# Patient Record
Sex: Female | Born: 1950 | Race: Black or African American | Hispanic: No | Marital: Single | Smoking: Former smoker
Health system: Southern US, Community
[De-identification: ages and names within clinical notes are randomized; demographics above are authoritative.]

## PROBLEM LIST (undated history)

## (undated) DIAGNOSIS — C9000 Multiple myeloma not having achieved remission: Secondary | ICD-10-CM

## (undated) DIAGNOSIS — F419 Anxiety disorder, unspecified: Secondary | ICD-10-CM

## (undated) DIAGNOSIS — J189 Pneumonia, unspecified organism: Secondary | ICD-10-CM

## (undated) DIAGNOSIS — I10 Essential (primary) hypertension: Secondary | ICD-10-CM

## (undated) DIAGNOSIS — E876 Hypokalemia: Secondary | ICD-10-CM

## (undated) DIAGNOSIS — J449 Chronic obstructive pulmonary disease, unspecified: Secondary | ICD-10-CM

## (undated) DIAGNOSIS — E0590 Thyrotoxicosis, unspecified without thyrotoxic crisis or storm: Secondary | ICD-10-CM

## (undated) DIAGNOSIS — I509 Heart failure, unspecified: Secondary | ICD-10-CM

## (undated) DIAGNOSIS — R768 Other specified abnormal immunological findings in serum: Secondary | ICD-10-CM

## (undated) DIAGNOSIS — Z9481 Bone marrow transplant status: Secondary | ICD-10-CM

## (undated) DIAGNOSIS — K219 Gastro-esophageal reflux disease without esophagitis: Secondary | ICD-10-CM

## (undated) DIAGNOSIS — I5030 Unspecified diastolic (congestive) heart failure: Secondary | ICD-10-CM

## (undated) DIAGNOSIS — Z9189 Other specified personal risk factors, not elsewhere classified: Secondary | ICD-10-CM

## (undated) HISTORY — DX: Unspecified diastolic (congestive) heart failure: I50.30

## (undated) HISTORY — DX: Anxiety disorder, unspecified: F41.9

## (undated) HISTORY — DX: Hypokalemia: E87.6

## (undated) HISTORY — DX: Other specified abnormal immunological findings in serum: R76.8

## (undated) HISTORY — DX: Multiple myeloma not having achieved remission: C90.00

## (undated) HISTORY — PX: THYROIDECTOMY: SHX17

## (undated) HISTORY — DX: Other specified personal risk factors, not elsewhere classified: Z91.89

## (undated) HISTORY — DX: Bone marrow transplant status: Z94.81

## (undated) MED FILL — Dexamethasone Sodium Phosphate Inj 100 MG/10ML: INTRAMUSCULAR | Qty: 2 | Status: AC

---

## 2008-03-27 ENCOUNTER — Ambulatory Visit: Payer: Self-pay

## 2009-11-08 ENCOUNTER — Emergency Department: Payer: Self-pay | Admitting: Emergency Medicine

## 2009-12-17 ENCOUNTER — Emergency Department: Payer: Self-pay | Admitting: Emergency Medicine

## 2012-03-02 ENCOUNTER — Emergency Department: Payer: Self-pay | Admitting: Emergency Medicine

## 2017-04-05 ENCOUNTER — Emergency Department: Payer: Medicare Other

## 2017-04-05 ENCOUNTER — Encounter: Payer: Self-pay | Admitting: *Deleted

## 2017-04-05 ENCOUNTER — Inpatient Hospital Stay
Admission: EM | Admit: 2017-04-05 | Discharge: 2017-04-11 | DRG: 684 | Disposition: A | Discharge status: Home / Self Care | Payer: Medicare Other | Attending: Internal Medicine | Admitting: Internal Medicine

## 2017-04-05 DIAGNOSIS — R946 Abnormal results of thyroid function studies: Secondary | ICD-10-CM | POA: Diagnosis present

## 2017-04-05 DIAGNOSIS — M545 Low back pain: Secondary | ICD-10-CM | POA: Diagnosis present

## 2017-04-05 DIAGNOSIS — D892 Hypergammaglobulinemia, unspecified: Secondary | ICD-10-CM | POA: Diagnosis present

## 2017-04-05 DIAGNOSIS — I10 Essential (primary) hypertension: Secondary | ICD-10-CM

## 2017-04-05 DIAGNOSIS — Z825 Family history of asthma and other chronic lower respiratory diseases: Secondary | ICD-10-CM

## 2017-04-05 DIAGNOSIS — F17210 Nicotine dependence, cigarettes, uncomplicated: Secondary | ICD-10-CM | POA: Diagnosis not present

## 2017-04-05 DIAGNOSIS — D638 Anemia in other chronic diseases classified elsewhere: Secondary | ICD-10-CM | POA: Diagnosis present

## 2017-04-05 DIAGNOSIS — D4989 Neoplasm of unspecified behavior of other specified sites: Secondary | ICD-10-CM | POA: Diagnosis not present

## 2017-04-05 DIAGNOSIS — N179 Acute kidney failure, unspecified: Principal | ICD-10-CM | POA: Diagnosis present

## 2017-04-05 DIAGNOSIS — K5900 Constipation, unspecified: Secondary | ICD-10-CM | POA: Diagnosis present

## 2017-04-05 DIAGNOSIS — E0590 Thyrotoxicosis, unspecified without thyrotoxic crisis or storm: Secondary | ICD-10-CM | POA: Diagnosis present

## 2017-04-05 DIAGNOSIS — R339 Retention of urine, unspecified: Secondary | ICD-10-CM | POA: Diagnosis not present

## 2017-04-05 DIAGNOSIS — R05 Cough: Secondary | ICD-10-CM | POA: Diagnosis not present

## 2017-04-05 DIAGNOSIS — C9000 Multiple myeloma not having achieved remission: Secondary | ICD-10-CM | POA: Diagnosis not present

## 2017-04-05 DIAGNOSIS — N390 Urinary tract infection, site not specified: Secondary | ICD-10-CM | POA: Diagnosis not present

## 2017-04-05 DIAGNOSIS — R1900 Intra-abdominal and pelvic swelling, mass and lump, unspecified site: Secondary | ICD-10-CM | POA: Diagnosis not present

## 2017-04-05 DIAGNOSIS — E538 Deficiency of other specified B group vitamins: Secondary | ICD-10-CM | POA: Diagnosis present

## 2017-04-05 DIAGNOSIS — R0609 Other forms of dyspnea: Secondary | ICD-10-CM | POA: Diagnosis not present

## 2017-04-05 DIAGNOSIS — R319 Hematuria, unspecified: Secondary | ICD-10-CM | POA: Diagnosis not present

## 2017-04-05 DIAGNOSIS — R1084 Generalized abdominal pain: Secondary | ICD-10-CM | POA: Diagnosis not present

## 2017-04-05 DIAGNOSIS — R630 Anorexia: Secondary | ICD-10-CM | POA: Diagnosis not present

## 2017-04-05 DIAGNOSIS — D472 Monoclonal gammopathy: Secondary | ICD-10-CM | POA: Diagnosis not present

## 2017-04-05 DIAGNOSIS — D649 Anemia, unspecified: Secondary | ICD-10-CM

## 2017-04-05 DIAGNOSIS — R809 Proteinuria, unspecified: Secondary | ICD-10-CM | POA: Diagnosis not present

## 2017-04-05 DIAGNOSIS — C9002 Multiple myeloma in relapse: Secondary | ICD-10-CM

## 2017-04-05 DIAGNOSIS — Z716 Tobacco abuse counseling: Secondary | ICD-10-CM | POA: Diagnosis not present

## 2017-04-05 DIAGNOSIS — Z79899 Other long term (current) drug therapy: Secondary | ICD-10-CM | POA: Diagnosis not present

## 2017-04-05 DIAGNOSIS — R634 Abnormal weight loss: Secondary | ICD-10-CM | POA: Diagnosis not present

## 2017-04-05 DIAGNOSIS — M549 Dorsalgia, unspecified: Secondary | ICD-10-CM | POA: Diagnosis not present

## 2017-04-05 DIAGNOSIS — G8929 Other chronic pain: Secondary | ICD-10-CM | POA: Diagnosis present

## 2017-04-05 DIAGNOSIS — G479 Sleep disorder, unspecified: Secondary | ICD-10-CM | POA: Diagnosis present

## 2017-04-05 DIAGNOSIS — Z820 Family history of epilepsy and other diseases of the nervous system: Secondary | ICD-10-CM | POA: Diagnosis not present

## 2017-04-05 DIAGNOSIS — R200 Anesthesia of skin: Secondary | ICD-10-CM | POA: Diagnosis not present

## 2017-04-05 DIAGNOSIS — Z720 Tobacco use: Secondary | ICD-10-CM | POA: Diagnosis not present

## 2017-04-05 LAB — URINALYSIS, COMPLETE (UACMP) WITH MICROSCOPIC
BILIRUBIN URINE: NEGATIVE
Bacteria, UA: NONE SEEN
GLUCOSE, UA: NEGATIVE mg/dL
Ketones, ur: NEGATIVE mg/dL
NITRITE: NEGATIVE
PROTEIN: 100 mg/dL — AB
Specific Gravity, Urine: 1.006 (ref 1.005–1.030)
pH: 5 (ref 5.0–8.0)

## 2017-04-05 LAB — CBC
HCT: 27.4 % — ABNORMAL LOW (ref 35.0–47.0)
Hemoglobin: 9.3 g/dL — ABNORMAL LOW (ref 12.0–16.0)
MCH: 31.7 pg (ref 26.0–34.0)
MCHC: 34.1 g/dL (ref 32.0–36.0)
MCV: 92.9 fL (ref 80.0–100.0)
PLATELETS: 224 10*3/uL (ref 150–440)
RBC: 2.94 MIL/uL — AB (ref 3.80–5.20)
RDW: 15.1 % — ABNORMAL HIGH (ref 11.5–14.5)
WBC: 7.8 10*3/uL (ref 3.6–11.0)

## 2017-04-05 LAB — COMPREHENSIVE METABOLIC PANEL
ALK PHOS: 48 U/L (ref 38–126)
ALT: 9 U/L — AB (ref 14–54)
ANION GAP: 15 (ref 5–15)
AST: 22 U/L (ref 15–41)
Albumin: 3.7 g/dL (ref 3.5–5.0)
BILIRUBIN TOTAL: 0.6 mg/dL (ref 0.3–1.2)
BUN: 55 mg/dL — ABNORMAL HIGH (ref 6–20)
CALCIUM: 9.5 mg/dL (ref 8.9–10.3)
CO2: 21 mmol/L — ABNORMAL LOW (ref 22–32)
CREATININE: 7.24 mg/dL — AB (ref 0.44–1.00)
Chloride: 102 mmol/L (ref 101–111)
GFR, EST AFRICAN AMERICAN: 6 mL/min — AB (ref >60)
GFR, EST NON AFRICAN AMERICAN: 5 mL/min — AB (ref >60)
Glucose, Bld: 107 mg/dL — ABNORMAL HIGH (ref 65–99)
Potassium: 4 mmol/L (ref 3.5–5.1)
SODIUM: 138 mmol/L (ref 135–145)
TOTAL PROTEIN: 10.1 g/dL — AB (ref 6.5–8.1)

## 2017-04-05 LAB — IRON AND TIBC
Iron: 35 ug/dL (ref 28–170)
SATURATION RATIOS: 15 % (ref 10.4–31.8)
TIBC: 230 ug/dL — ABNORMAL LOW (ref 250–450)
UIBC: 195 ug/dL

## 2017-04-05 LAB — RETICULOCYTES
RBC.: 2.66 MIL/uL — AB (ref 3.80–5.20)
RETIC COUNT ABSOLUTE: 16 10*3/uL — AB (ref 19.0–183.0)
RETIC CT PCT: 0.6 % (ref 0.4–3.1)

## 2017-04-05 LAB — LIPASE, BLOOD: Lipase: 15 U/L (ref 11–51)

## 2017-04-05 LAB — FOLATE: Folate: 8.1 ng/mL (ref >5.9)

## 2017-04-05 LAB — FERRITIN: FERRITIN: 340 ng/mL — AB (ref 11–307)

## 2017-04-05 MED ORDER — ONDANSETRON HCL 4 MG PO TABS
4.0000 mg | ORAL_TABLET | Freq: Four times a day (QID) | ORAL | Status: DC | PRN
  Administered 2017-04-07: 4 mg via ORAL
  Filled 2017-04-05: qty 1

## 2017-04-05 MED ORDER — HEPARIN SODIUM (PORCINE) 5000 UNIT/ML IJ SOLN
5000.0000 [IU] | Freq: Three times a day (TID) | INTRAMUSCULAR | Status: DC
  Administered 2017-04-05 – 2017-04-07 (×5): 5000 [IU] via SUBCUTANEOUS
  Filled 2017-04-05 (×5): qty 1

## 2017-04-05 MED ORDER — DOCUSATE SODIUM 100 MG PO CAPS
100.0000 mg | ORAL_CAPSULE | Freq: Two times a day (BID) | ORAL | Status: DC
  Administered 2017-04-05 – 2017-04-11 (×12): 100 mg via ORAL
  Filled 2017-04-05 (×12): qty 1

## 2017-04-05 MED ORDER — SODIUM CHLORIDE 0.9 % IV SOLN
INTRAVENOUS | Status: DC
  Administered 2017-04-05 – 2017-04-11 (×13): via INTRAVENOUS

## 2017-04-05 MED ORDER — ONDANSETRON HCL 4 MG/2ML IJ SOLN
4.0000 mg | Freq: Four times a day (QID) | INTRAMUSCULAR | Status: DC | PRN
  Administered 2017-04-05: 4 mg via INTRAVENOUS
  Filled 2017-04-05: qty 2

## 2017-04-05 MED ORDER — LABETALOL HCL 5 MG/ML IV SOLN
5.0000 mg | Freq: Once | INTRAVENOUS | Status: AC
  Administered 2017-04-05: 5 mg via INTRAVENOUS
  Filled 2017-04-05: qty 4

## 2017-04-05 MED ORDER — HYDRALAZINE HCL 20 MG/ML IJ SOLN
10.0000 mg | Freq: Once | INTRAMUSCULAR | Status: AC
  Administered 2017-04-05: 10 mg via INTRAVENOUS
  Filled 2017-04-05: qty 1

## 2017-04-05 MED ORDER — HYDRALAZINE HCL 20 MG/ML IJ SOLN
10.0000 mg | Freq: Four times a day (QID) | INTRAMUSCULAR | Status: DC | PRN
  Filled 2017-04-05: qty 1

## 2017-04-05 MED ORDER — ACETAMINOPHEN 650 MG RE SUPP: 650.0000 mg | Freq: Four times a day (QID) | RECTAL | Status: DC | PRN

## 2017-04-05 MED ORDER — SODIUM CHLORIDE 0.9 % IV BOLUS (SEPSIS)
1000.0000 mL | Freq: Once | INTRAVENOUS | Status: AC
  Administered 2017-04-05: 1000 mL via INTRAVENOUS

## 2017-04-05 MED ORDER — AMLODIPINE BESYLATE 5 MG PO TABS
5.0000 mg | ORAL_TABLET | Freq: Every day | ORAL | Status: DC
  Administered 2017-04-06: 5 mg via ORAL
  Filled 2017-04-05: qty 1

## 2017-04-05 MED ORDER — PNEUMOCOCCAL VAC POLYVALENT 25 MCG/0.5ML IJ INJ: 0.5000 mL | INJECTION | INTRAMUSCULAR | Status: DC

## 2017-04-05 MED ORDER — POLYETHYLENE GLYCOL 3350 17 G PO PACK
17.0000 g | PACK | Freq: Every day | ORAL | Status: DC | PRN
  Administered 2017-04-07: 17 g via ORAL
  Filled 2017-04-05: qty 1

## 2017-04-05 MED ORDER — SODIUM CHLORIDE 0.9 % IV BOLUS (SEPSIS)
500.0000 mL | Freq: Once | INTRAVENOUS | Status: AC
  Administered 2017-04-05: 500 mL via INTRAVENOUS

## 2017-04-05 MED ORDER — ACETAMINOPHEN 325 MG PO TABS: 650.0000 mg | ORAL_TABLET | Freq: Four times a day (QID) | ORAL | Status: DC | PRN

## 2017-04-05 NOTE — ED Notes (Signed)
Assisted pt to bathroom

## 2017-04-05 NOTE — ED Provider Notes (Signed)
[facility] Emergency Department Provider Note    None    (approximate)  I have reviewed the triage vital signs and the nursing notes.   HISTORY  Chief Complaint Abdominal Pain and Shortness of Breath    HPI Brandi [name] is a 66 y.o. female presents to the ER with chief complaint of nausea vomiting constipation. Has not been able to eat or drink due to nausea. States she's also been having some shortness of breath and wheezing with productive cough. No measured fevers but has felt some chills. States that she has noted some decreased urine output. No dysuria. No vaginal discharge. No recent antibiotics. States she has been taking from Mucinex as well as Alka-Seltzer for symptoms. No other medications.   History reviewed. No pertinent past medical history. History reviewed. No pertinent family history. History reviewed. No pertinent surgical history. There are no active problems to display for this patient.     Prior to Admission medications   Not on File    Allergies Patient has no allergy information on record.    Social History Social History  Substance Use Topics  . Smoking status: Current Every Day Smoker    Packs/day: 0.50    Types: Cigarettes  . Smokeless tobacco: Never Used  . Alcohol use No    Review of Systems Patient denies headaches, rhinorrhea, blurry vision, numbness, shortness of breath, chest pain, edema, cough, abdominal pain, nausea, vomiting, diarrhea, dysuria, fevers, rashes or hallucinations unless otherwise stated above in HPI. ____________________________________________   PHYSICAL EXAM:  VITAL SIGNS: Vitals:   04/05/17 1808  BP: (!) 200/106  Pulse: (!) 56  Resp: (!) 22  Temp: 98.4 F (36.9 C)  SpO2: 100%    Constitutional: Alert and oriented. Well appearing and in no acute distress. Eyes: Conjunctivae are normal.  Head: Atraumatic. Nose: No congestion/rhinnorhea. Mouth/Throat: Mucous membranes  are moist.   Neck: No stridor. Painless ROM.  Cardiovascular: Normal rate, regular rhythm. Grossly normal heart sounds.  Good peripheral circulation. Respiratory: Normal respiratory effort.  No retractions. Lungs CTAB. Gastrointestinal: Soft with mild bilateral lower abd ttp. No distention. No abdominal bruits. No CVA tenderness. Musculoskeletal: No lower extremity tenderness nor edema.  No joint effusions. Neurologic:  Normal speech and language. No gross focal neurologic deficits are appreciated. No facial droop Skin:  Skin is warm, dry and intact. No rash noted. Psychiatric: Mood and affect are normal. Speech and behavior are normal.  ____________________________________________   LABS (all labs ordered are listed, but only abnormal results are displayed)  Results for orders placed or performed during the hospital encounter of 04/05/17 (from the past 24 hour(s))  Lipase, blood     Status: None   Collection Time: 04/05/17  6:13 PM  Result Value Ref Range   Lipase 15 11 - 51 U/L  Comprehensive metabolic panel     Status: Abnormal   Collection Time: 04/05/17  6:13 PM  Result Value Ref Range   Sodium 138 135 - 145 mmol/L   Potassium 4.0 3.5 - 5.1 mmol/L   Chloride 102 101 - 111 mmol/L   CO2 21 (L) 22 - 32 mmol/L   Glucose, Bld 107 (H) 65 - 99 mg/dL   BUN 55 (H) 6 - 20 mg/dL   Creatinine, Ser 7.24 (H) 0.44 - 1.00 mg/dL   Calcium 9.5 8.9 - 10.3 mg/dL   Total Protein 10.1 (H) 6.5 - 8.1 g/dL   Albumin 3.7 3.5 - 5.0 g/dL   AST 22 15 -  41 U/L   ALT 9 (L) 14 - 54 U/L   Alkaline Phosphatase 48 38 - 126 U/L   Total Bilirubin 0.6 0.3 - 1.2 mg/dL   GFR calc non Af Amer 5 (L) >60 mL/min   GFR calc Af Amer 6 (L) >60 mL/min   Anion gap 15 5 - 15  CBC     Status: Abnormal   Collection Time: 04/05/17  6:13 PM  Result Value Ref Range   WBC 7.8 3.6 - 11.0 K/uL   RBC 2.94 (L) 3.80 - 5.20 MIL/uL   Hemoglobin 9.3 (L) 12.0 - 16.0 g/dL   HCT 27.4 (L) 35.0 - 47.0 %   MCV 92.9 80.0 - 100.0 fL    MCH 31.7 26.0 - 34.0 pg   MCHC 34.1 32.0 - 36.0 g/dL   RDW 15.1 (H) 11.5 - 14.5 %   Platelets 224 150 - 440 K/uL   ____________________________________________  EKG My review and personal interpretation at Time: 18:11   Indication: abd pain  Rate: 50  Rhythm: sinus Axis: normal Other: normal intervals, no stemi, non specific st changes ____________________________________________  RADIOLOGY  I personally reviewed all radiographic images ordered to evaluate for the above acute complaints and reviewed radiology reports and findings.  These findings were personally discussed with the patient.  Please see medical record for radiology report.  ____________________________________________   PROCEDURES  Procedure(s) performed:  Procedures    Critical Care performed: yes CRITICAL CARE Performed by: Merlyn Lot   Total critical care time: 30 minutes  Critical care time was exclusive of separately billable procedures and treating other patients.  Critical care was necessary to treat or prevent imminent or life-threatening deterioration.  Critical care was time spent personally by me on the following activities: development of treatment plan with patient and/or surrogate as well as nursing, discussions with consultants, evaluation of patient's response to treatment, examination of patient, obtaining history from patient or surrogate, ordering and performing treatments and interventions, ordering and review of laboratory studies, ordering and review of radiographic studies, pulse oximetry and re-evaluation of patient's condition.  ____________________________________________   INITIAL IMPRESSION / ASSESSMENT AND PLAN / ED COURSE  Pertinent labs & imaging results that were available during my care of the patient were reviewed by me and considered in my medical decision making (see chart for details).  DDX: sbo, enteritis, AAA, dissection, aki, infarct  Brandi [name] is a  66 y.o. who presents to the ED with Symptoms as described above. Blood work sent for the above differential shows profound a KI. Patient without any known renal disease is not a dialysis patient. Does endorse decreased urine output. No evidence of pneumonia. Possible medication induced a KI. CT abdomen ordered without contrast shows atherosclerotic aorta but no evidence of AAA. It does not seem clinically consistent with dissection as she has good distal pulses with no significant abnormality identified on noncontrast CT. At this point I do not feel that pretest probability high enough to indicate contrast imaging with this presentation. We'll give IV fluids will order ultrasound.  Patient will require admission to the hospital for further evaluation and management.  Have discussed with the patient and available family all diagnostics and treatments performed thus far and all questions were answered to the best of my ability. The patient demonstrates understanding and agreement with plan.       ____________________________________________   FINAL CLINICAL IMPRESSION(S) / ED DIAGNOSES  Final diagnoses:  AKI (acute kidney injury) ([place])  Generalized abdominal  pain  Anemia, unspecified type  Hypertension, unspecified type      NEW MEDICATIONS STARTED DURING THIS VISIT:  New Prescriptions   No medications on file     Note:  This document was prepared using Dragon voice recognition software and may include unintentional dictation errors.    Merlyn Lot, MD 04/05/17 2051

## 2017-04-05 NOTE — ED Triage Notes (Signed)
Pt to ED reporting lower abd pain with nausea, vomiting and constipation for the past week. PT reports she has not been able to eat or drink due to the nausea. Pt also reports having SOB and "wheezing" with a productive cough. PT reports bilateral rib pains when coughing as well. Pt in NAD at this time and denies heart or lung hx. No fevers reported. No changes in urine reported.

## 2017-04-05 NOTE — H&P (Signed)
[facility] [facility] [place] NAME: Brandi [name]    MR#:  [id number]  DATE OF BIRTH:  April 24, 1951  DATE OF ADMISSION:  04/05/2017  PRIMARY CARE PHYSICIAN: [facility] [place]   REQUESTING/REFERRING PHYSICIAN: Dr. Merlyn Lot  CHIEF COMPLAINT:   Chief Complaint  Patient presents with  . Abdominal Pain  . Shortness of Breath    HISTORY OF PRESENT ILLNESS:  Brandi [name]  is a 66 y.o. female with no significant past medical history presents to the hospital secondary to worsening back pain, myalgias and nausea. Patient started having upper respiratory tract infection symptoms about 2-3 weeks ago, associated with some cough and myalgias. She's always had muscle cramps before. She hasn't seen a physician in several years and has not had any kind of blood work lately. She notices that she was having hesitation with starting her urinary stream, however 1 started she had good amounts up until 3 days ago. Her urine output has significantly decreased. Due to significant nausea her oral intake has decreased as well. She took 4 tablets of Advil and 2 tablets of ibuprofen couple of days ago. She came to the emergency room and was noted to have a creatinine of 7.2 and normal potassium on her blood work. Also elevated protein and anemia noted.urine protein was elevated as well and noted to have high blood pressure here.  PAST MEDICAL HISTORY:  History reviewed. No pertinent past medical history.  PAST SURGICAL HISTORY:  History reviewed. No pertinent surgical history.  SOCIAL HISTORY:   Social History  Substance Use Topics  . Smoking status: Current Every Day Smoker    Packs/day: 0.50    Types: Cigarettes  . Smokeless tobacco: Never Used  . Alcohol use No    FAMILY HISTORY:   Family History  Problem Relation Age of Onset  . Pneumonia Mother   . Seizures Father     DRUG ALLERGIES:  No Known Allergies  REVIEW OF SYSTEMS:    Review of Systems  Constitutional: Positive for malaise/fatigue. Negative for chills, fever and weight loss.  HENT: Positive for congestion and sore throat. Negative for ear discharge, ear pain, hearing loss and nosebleeds.   Eyes: Negative for blurred vision, double vision and photophobia.  Respiratory: Negative for cough, hemoptysis, shortness of breath and wheezing.   Cardiovascular: Negative for chest pain, palpitations, orthopnea and leg swelling.  Gastrointestinal: Positive for abdominal pain and nausea. Negative for constipation, diarrhea, heartburn, melena and vomiting.  Genitourinary: Negative for dysuria, frequency and urgency.       Hesitancy  Musculoskeletal: Positive for back pain. Negative for myalgias and neck pain.  Skin: Negative for rash.  Neurological: Negative for dizziness, tingling, sensory change, speech change, focal weakness and headaches.  Endo/Heme/Allergies: Does not bruise/bleed easily.  Psychiatric/Behavioral: Negative for depression.    MEDICATIONS AT HOME:   Prior to Admission medications   Not on File      VITAL SIGNS:  Blood pressure (!) 168/87, pulse 71, temperature 98.4 F (36.9 C), temperature source Oral, resp. rate 17, height '5\' 2"'  (1.575 m), weight 64.9 kg (143 lb), SpO2 99 %.  PHYSICAL EXAMINATION:   Physical Exam  GENERAL:  66 y.o.-year-old patient lying in the bed with no acute distress.  EYES: Pupils equal, round, reactive to light and accommodation. No scleral icterus. Extraocular muscles intact.  HEENT: Head atraumatic, normocephalic. Oropharynx and nasopharynx clear.  NECK:  Supple, no jugular venous distention. No thyroid enlargement, no tenderness.  LUNGS: Normal breath sounds bilaterally, no wheezing, rales,rhonchi or crepitation. No use of accessory muscles of respiration.  CARDIOVASCULAR: S1, S2 normal. No murmurs, rubs, or gallops.  ABDOMEN: Soft, nontender, nondistended. Bowel sounds present. No organomegaly or mass.   EXTREMITIES: No pedal edema, cyanosis, or clubbing.  NEUROLOGIC: Cranial nerves II through XII are intact. Muscle strength 5/5 in all extremities. Sensation intact. Gait not checked.  PSYCHIATRIC: The patient is alert and oriented x 3.  SKIN: No obvious rash, lesion, or ulcer.   LABORATORY PANEL:   CBC  Recent Labs Lab 04/05/17 1813  WBC 7.8  HGB 9.3*  HCT 27.4*  PLT 224   ------------------------------------------------------------------------------------------------------------------  Chemistries   Recent Labs Lab 04/05/17 1813  NA 138  K 4.0  CL 102  CO2 21*  GLUCOSE 107*  BUN 55*  CREATININE 7.24*  CALCIUM 9.5  AST 22  ALT 9*  ALKPHOS 48  BILITOT 0.6   ------------------------------------------------------------------------------------------------------------------  Cardiac Enzymes No results for input(s): TROPONINI in the last 168 hours. ------------------------------------------------------------------------------------------------------------------  RADIOLOGY:  Ct Abdomen Pelvis Wo Contrast  Result Date: 04/05/2017 CLINICAL DATA:  Pulsatile abdominal mass. Suspect abdominal aortic aneurysm EXAM: CT ABDOMEN AND PELVIS WITHOUT CONTRAST TECHNIQUE: Multidetector CT imaging of the abdomen and pelvis was performed following the standard protocol without IV contrast. COMPARISON:  None. FINDINGS: Lower chest: Lung bases clear Hepatobiliary: Small calcified hepatic granuloma. No liver mass. Gallbladder and bile ducts normal Pancreas: Negative Spleen: Negative Adrenals/Urinary Tract: No renal mass or obstruction or stone. Urinary bladder normal Stomach/Bowel: Negative for bowel obstruction. No bowel mass or edema. Normal appendix. Vascular/Lymphatic: Atherosclerotic aorta and iliac arteries. Negative for aortic aneurysm. No retroperitoneal mass Reproductive: Small uterine fibroids. Other: No free-fluid Musculoskeletal: Lumbar degenerative changes without acute skeletal  abnormality. IMPRESSION: Atherosclerotic aorta without aneurysm. No acute abnormality. Electronically Signed   By: Franchot Gallo M.D.   On: 04/05/2017 19:37   Dg Chest 2 View  Result Date: 04/05/2017 CLINICAL DATA:  Cough and wheezing for 1 week EXAM: CHEST  2 VIEW COMPARISON:  None. FINDINGS: Cardiac shadow is within normal limits. Mild aortic calcifications are seen. Lungs are well aerated bilaterally with mild interstitial changes. No focal confluent infiltrate is seen. No sizable effusion is noted. Mild degenerative changes of the thoracic spine are noted. IMPRESSION: Mild interstitial changes which may be chronic in nature. A mild degree of acute bronchitis could not be totally excluded. Electronically Signed   By: Inez Catalina M.D.   On: 04/05/2017 18:44   US Renal  Result Date: 04/05/2017 CLINICAL DATA:  Acute kidney injury. EXAM: RENAL / URINARY TRACT ULTRASOUND COMPLETE COMPARISON:  04/05/2017 FINDINGS: Right Kidney: Length: 13.4 cm. Increased cortical echogenicity. No mass or hydronephrosis visualized. Left Kidney: Length: 13.8 cm. Increased cortical echogenicity. No mass or hydronephrosis visualized. Bladder: Appears normal for degree of bladder distention. IMPRESSION: 1. No hydronephrosis. 2. Bilateral echogenic kidneys compatible with medical renal disease. Electronically Signed   By: Kerby Moors M.D.   On: 04/05/2017 20:43    EKG:   Orders placed or performed during the hospital encounter of 04/05/17  . ED EKG  . ED EKG    IMPRESSION AND PLAN:   Brandi [name]  is a 66 y.o. female with no significant past medical history presents to the hospital secondary to worsening back pain, myalgias and nausea.  #1 acute renal failure-unknown baseline. Partly prerenal and also could be from hypertensive nephrosclerosis-undiagnosed. -Admit, gentle hydration. Strict input and output monitoring -increased urine protein also noted. -No  acute indication for dialysis is still making urine  and potassium is within normal limits -Workup for multiple myeloma sent in as elevated protein and anemia noted as well. HIV and hepatitis tests ordered as well. -Nephrology consult requested. -Monitor to see if she needs any renal biopsy  #2 anemia-likely anemia of chronic disease. Check serum electrophoresis and also iron studies  #3 hypertension-started on Norvasc, also IV hydralazine as needed  #4 DVT prophylaxis-on subcutaneous heparin  #5 tobacco use disorder-strongly counseled against smoking   All the records are reviewed and case discussed with ED provider. Management plans discussed with the patient, family and they are in agreement.  CODE STATUS: [name] Code  TOTAL TIME TAKING CARE OF THIS PATIENT: 50 minutes.    Gladstone Lighter M.D on 04/05/2017 at 9:08 PM  Between 7am to 6pm - Pager - [phone]  After 6pm go to [email or web address] - password [email or web address] [facility]  Office  [phone]  CC: Primary care physician; [facility]

## 2017-04-05 NOTE — ED Notes (Signed)
Pt states she has lower abdominal pain along with nausea and vomiting. Started about 2 weeks ago. Also states having issues with swallowing for about 57months due to mouth being so dry.

## 2017-04-05 NOTE — ED Notes (Signed)
Patient transported to X-ray 

## 2017-04-06 LAB — BASIC METABOLIC PANEL
ANION GAP: 9 (ref 5–15)
Anion gap: 12 (ref 5–15)
BUN: 50 mg/dL — ABNORMAL HIGH (ref 6–20)
BUN: 46 mg/dL — ABNORMAL HIGH (ref 6–20)
CHLORIDE: 111 mmol/L (ref 101–111)
CHLORIDE: 107 mmol/L (ref 101–111)
CO2: 21 mmol/L — AB (ref 22–32)
CO2: 19 mmol/L — AB (ref 22–32)
CREATININE: 6.7 mg/dL — AB (ref 0.44–1.00)
CREATININE: 6.9 mg/dL — AB (ref 0.44–1.00)
Calcium: 8.4 mg/dL — ABNORMAL LOW (ref 8.9–10.3)
Calcium: 8.7 mg/dL — ABNORMAL LOW (ref 8.9–10.3)
GFR calc Af Amer: 7 mL/min — ABNORMAL LOW (ref >60)
GFR calc Af Amer: 6 mL/min — ABNORMAL LOW (ref >60)
GFR calc non Af Amer: 6 mL/min — ABNORMAL LOW (ref >60)
GFR calc non Af Amer: 6 mL/min — ABNORMAL LOW (ref >60)
Glucose, Bld: 96 mg/dL (ref 65–99)
Glucose, Bld: 129 mg/dL — ABNORMAL HIGH (ref 65–99)
POTASSIUM: 4 mmol/L (ref 3.5–5.1)
Potassium: 3.6 mmol/L (ref 3.5–5.1)
SODIUM: 141 mmol/L (ref 135–145)
SODIUM: 138 mmol/L (ref 135–145)

## 2017-04-06 LAB — CBC
HEMATOCRIT: 23.9 % — AB (ref 35.0–47.0)
HEMOGLOBIN: 8.5 g/dL — AB (ref 12.0–16.0)
MCH: 33 pg (ref 26.0–34.0)
MCHC: 35.4 g/dL (ref 32.0–36.0)
MCV: 93.3 fL (ref 80.0–100.0)
Platelets: 181 10*3/uL (ref 150–440)
RBC: 2.56 MIL/uL — AB (ref 3.80–5.20)
RDW: 14.9 % — ABNORMAL HIGH (ref 11.5–14.5)
WBC: 7.8 10*3/uL (ref 3.6–11.0)

## 2017-04-06 LAB — VITAMIN B12: VITAMIN B 12: 144 pg/mL — AB (ref 180–914)

## 2017-04-06 LAB — PROTEIN / CREATININE RATIO, URINE
CREATININE, URINE: 96 mg/dL
PROTEIN CREATININE RATIO: 1.5 mg/mg{creat} — AB (ref 0.00–0.15)
TOTAL PROTEIN, URINE: 144 mg/dL

## 2017-04-06 LAB — TSH: TSH: 0.071 u[IU]/mL — AB (ref 0.350–4.500)

## 2017-04-06 MED ORDER — ALUM & MAG HYDROXIDE-SIMETH 200-200-20 MG/5ML PO SUSP
30.0000 mL | ORAL | Status: DC | PRN
  Administered 2017-04-06: 30 mL via ORAL
  Filled 2017-04-06: qty 30

## 2017-04-06 MED ORDER — ENSURE ENLIVE PO LIQD
237.0000 mL | Freq: Two times a day (BID) | ORAL | Status: DC
  Administered 2017-04-06 – 2017-04-11 (×7): 237 mL via ORAL

## 2017-04-06 MED ORDER — AMLODIPINE BESYLATE 10 MG PO TABS
10.0000 mg | ORAL_TABLET | Freq: Every day | ORAL | Status: DC
  Administered 2017-04-07 – 2017-04-11 (×5): 10 mg via ORAL
  Filled 2017-04-06 (×5): qty 1

## 2017-04-06 NOTE — Consult Note (Signed)
Date: 04/06/2017                  Patient Name:  Brandi [name]  MRN: [id number]  DOB: 01/26/1951  Age / Sex: 66 y.o., female         PCP: [facility] [place]                 Service Requesting Consult: Inpatient/ Demetrios Loll, MD                 Reason for Consult: Acute Kidney Injury            History of Present Illness: Patient is a 66 y.o. female with no known medical problems , who was admitted to [facility] on 04/05/2017 for evaluation of acute kidney injury.  Patient presented yesterday with cough, shortness of breath, back pain, and suprapubic tenderness. She reports these symptoms have been going on without resolving for the past two weeks. Her most concerning symptom is her suprapubic tenderness. States that it is intermittently sore and aches, denies aggravating or relieving factors. Denies any previous occurrence of these symptoms. Of note she babysits her grandchildren whom were recently sick with URI symptoms.  Baseline creatinine unknown, presented with Cr of 7.24, todays Cr improved to 6.70 with IVF    Medications: Outpatient medications: No prescriptions prior to admission.    Current medications: Current Facility-Administered Medications  Medication Dose Route Frequency Provider Last Rate Last Dose  . 0.9 %  sodium chloride infusion   Intravenous Continuous Gladstone Lighter, MD 100 mL/hr at 04/06/17 1056    . acetaminophen (TYLENOL) tablet 650 mg  650 mg Oral Q6H PRN Gladstone Lighter, MD       Or  . acetaminophen (TYLENOL) suppository 650 mg  650 mg Rectal Q6H PRN Gladstone Lighter, MD      . amLODipine (NORVASC) tablet 5 mg  5 mg Oral Daily Gladstone Lighter, MD   5 mg at 04/06/17 0900  . docusate sodium (COLACE) capsule 100 mg  100 mg Oral BID Gladstone Lighter, MD   100 mg at 04/06/17 0900  . heparin injection 5,000 Units  5,000 Units Subcutaneous Q8H Gladstone Lighter, MD   5,000 Units at 04/06/17 [phone]  . hydrALAZINE (APRESOLINE) injection  10 mg  10 mg Intravenous Q6H PRN Gladstone Lighter, MD      . ondansetron (ZOFRAN) tablet 4 mg  4 mg Oral Q6H PRN Gladstone Lighter, MD       Or  . ondansetron (ZOFRAN) injection 4 mg  4 mg Intravenous Q6H PRN Gladstone Lighter, MD   4 mg at 04/05/17 2320  . pneumococcal 23 valent vaccine (PNU-IMMUNE) injection 0.5 mL  0.5 mL Intramuscular Tomorrow-1000 Demetrios Loll, MD      . polyethylene glycol (MIRALAX / GLYCOLAX) packet 17 g  17 g Oral Daily PRN Gladstone Lighter, MD          Allergies: No Known Allergies    Past Medical History: History reviewed. No pertinent past medical history.   Past Surgical History: History reviewed. No pertinent surgical history.   Family History: Family History  Problem Relation Age of Onset  . Pneumonia Mother   . Seizures Father      Social History: Social History   Social History  . Marital status: Single    Spouse name: N/A  . Number of children: N/A  . Years of education: N/A   Occupational History  . Not on file.   Social History Main Topics  .  Smoking status: Current Every Day Smoker    Packs/day: 0.50    Types: Cigarettes  . Smokeless tobacco: Never Used  . Alcohol use No  . Drug use: No  . Sexual activity: Not on file   Other Topics Concern  . Not on file   Social History Narrative   Lives at home with family. Independent at baseline   - current marijuana use  Review of Systems: Gen: Reports unintentional weight loss over past year, reports fatigue and chills. Denies fever.  HEENT: Denies headache, reports nasal congestion with PND, denies sore throat. CV: Denies chest pain, shortness of breath at present but did admit to some last night Resp: Admits productive cough- white phlegm- improving since admission last night. Denies wheezing currently. GI: Admits nausea earlier today for which she received medication, last BM was two days ago which is sometimes normal for her. Admits suprapubic pain.  Denies: vomiting,  bloody stool, black tarry stool, pain with BM.  GU : Admits suprapubic pain. Denies: hematuria, dysuria, frequency. Admits urgency with occasional unintentional loss of urine 2x in past week- states this is not normal for her. Denies vaginal itching, burning, and discharge. MS: Admits bilateral lumbar back pain that occasional radiates down her right lateral leg.  Derm:  Denies skin rash or itching.  Psych: No complaints Heme: Admits fatigue Neuro: no complaints Endocrine: No complaints  Vital Signs: Blood pressure (!) 148/69, pulse 70, temperature 98 F (36.7 C), temperature source Oral, resp. rate 18, height 5\' 2"  (1.575 m), weight 64.7 kg (142 lb 11.2 oz), SpO2 98 %.   Intake/Output Summary (Last 24 hours) at 04/06/17 1205 Last data filed at 04/06/17 1045  Gross per 24 hour  Intake           846.67 ml  Output                1 ml  Net           845.67 ml    Weight trends: Filed Weights   04/05/17 1809 04/05/17 2242  Weight: 64.9 kg (143 lb) 64.7 kg (142 lb 11.2 oz)    Physical Exam: General:  Patient is alert, sitting in bed in NAD.   HEENT Sclera were anicteric, PERRL, throat without erythema or swelling, managing own secretions.  Neck:  no venous distention  Lungs: Clear to auscultation bilateraly  Heart::  Regular rate and rhythym  Abdomen: Soft, non-distended, tenderness in suprapubic region  Extremities:  No extremity swelling  Neurologic: AAOx3  Skin: Warm and dry             Lab results: Basic Metabolic Panel:  Recent Labs Lab 04/05/17 1813 04/06/17 0345  NA 138 141  K 4.0 4.0  CL 102 111  CO2 21* 21*  GLUCOSE 107* 96  BUN 55* 50*  CREATININE 7.24* 6.70*  CALCIUM 9.5 8.4*    Liver Function Tests:  Recent Labs Lab 04/05/17 1813  AST 22  ALT 9*  ALKPHOS 48  BILITOT 0.6  PROT 10.1*  ALBUMIN 3.7    Recent Labs Lab 04/05/17 1813  LIPASE 15   No results for input(s): AMMONIA in the last 168 hours.  CBC:  Recent Labs Lab  04/05/17 1813 04/06/17 0345  WBC 7.8 7.8  HGB 9.3* 8.5*  HCT 27.4* 23.9*  MCV 92.9 93.3  PLT 224 181    Cardiac Enzymes: No results for input(s): CKTOTAL, TROPONINI in the last 168 hours.  BNP: Invalid input(s): POCBNP  CBG:  No results for input(s): GLUCAP in the last 168 hours.  Microbiology: No results found for this or any previous visit (from the past 720 hour(s)).   Coagulation Studies: No results for input(s): LABPROT, INR in the last 72 hours.  Urinalysis:  Recent Labs  04/05/17 2034  COLORURINE STRAW*  LABSPEC 1.006  PHURINE 5.0  GLUCOSEU NEGATIVE  HGBUR MODERATE*  BILIRUBINUR NEGATIVE  KETONESUR NEGATIVE  PROTEINUR 100*  NITRITE NEGATIVE  LEUKOCYTESUR MODERATE*        Imaging: Ct Abdomen Pelvis Wo Contrast  Result Date: 04/05/2017 CLINICAL DATA:  Pulsatile abdominal mass. Suspect abdominal aortic aneurysm EXAM: CT ABDOMEN AND PELVIS WITHOUT CONTRAST TECHNIQUE: Multidetector CT imaging of the abdomen and pelvis was performed following the standard protocol without IV contrast. COMPARISON:  None. FINDINGS: Lower chest: Lung bases clear Hepatobiliary: Small calcified hepatic granuloma. No liver mass. Gallbladder and bile ducts normal Pancreas: Negative Spleen: Negative Adrenals/Urinary Tract: No renal mass or obstruction or stone. Urinary bladder normal Stomach/Bowel: Negative for bowel obstruction. No bowel mass or edema. Normal appendix. Vascular/Lymphatic: Atherosclerotic aorta and iliac arteries. Negative for aortic aneurysm. No retroperitoneal mass Reproductive: Small uterine fibroids. Other: No free-fluid Musculoskeletal: Lumbar degenerative changes without acute skeletal abnormality. IMPRESSION: Atherosclerotic aorta without aneurysm. No acute abnormality. Electronically Signed   By: Franchot Gallo M.D.   On: 04/05/2017 19:37   Dg Chest 2 View  Result Date: 04/05/2017 CLINICAL DATA:  Cough and wheezing for 1 week EXAM: CHEST  2 VIEW COMPARISON:  None.  FINDINGS: Cardiac shadow is within normal limits. Mild aortic calcifications are seen. Lungs are well aerated bilaterally with mild interstitial changes. No focal confluent infiltrate is seen. No sizable effusion is noted. Mild degenerative changes of the thoracic spine are noted. IMPRESSION: Mild interstitial changes which may be chronic in nature. A mild degree of acute bronchitis could not be totally excluded. Electronically Signed   By: Inez Catalina M.D.   On: 04/05/2017 18:44   US Renal  Result Date: 04/05/2017 CLINICAL DATA:  Acute kidney injury. EXAM: RENAL / URINARY TRACT ULTRASOUND COMPLETE COMPARISON:  04/05/2017 FINDINGS: Right Kidney: Length: 13.4 cm. Increased cortical echogenicity. No mass or hydronephrosis visualized. Left Kidney: Length: 13.8 cm. Increased cortical echogenicity. No mass or hydronephrosis visualized. Bladder: Appears normal for degree of bladder distention. IMPRESSION: 1. No hydronephrosis. 2. Bilateral echogenic kidneys compatible with medical renal disease. Electronically Signed   By: Kerby Moors M.D.   On: 04/05/2017 20:43      Assessment & Plan:  Patient is a 66 y.o. female with no known medical problems , who was admitted to [facility] on 04/05/2017 for evaluation of acute kidney injury.  Acute Renal Failure with Proteinuria/Hematuria - No baseline creatinine available - DDX includes prerenal leading to ATN via NSAID use, interstitial nephritis, HTN related kidney disease (chronic?), HIV (test pending), Myeloma, other GN.  Hypertension - control improving with current regimen, avoid ace inhibitor at this time due to ARF  UTI - will order urine culture

## 2017-04-06 NOTE — Progress Notes (Signed)
[place] [facility] [place] [facility] [place] NAME: Brandi [name]    MR#:  [id number]  DATE OF BIRTH:  01/21/1951  SUBJECTIVE:  CHIEF COMPLAINT:   Chief Complaint  Patient presents with  . Abdominal Pain  . Shortness of Breath   Decreased appetite, nausea and generalized weakness. REVIEW OF SYSTEMS:  Review of Systems  Constitutional: Positive for malaise/fatigue. Negative for chills and fever.  HENT: Negative for sore throat.   Eyes: Negative for blurred vision and double vision.  Respiratory: Negative for cough, hemoptysis, shortness of breath, wheezing and stridor.   Cardiovascular: Negative for chest pain, palpitations, orthopnea and leg swelling.  Gastrointestinal: Positive for nausea. Negative for abdominal pain, blood in stool, diarrhea, melena and vomiting.  Genitourinary: Negative for dysuria, flank pain and hematuria.  Musculoskeletal: Negative for back pain and joint pain.  Neurological: Positive for weakness. Negative for dizziness, sensory change, focal weakness, seizures, loss of consciousness and headaches.  Endo/Heme/Allergies: Negative for polydipsia.  Psychiatric/Behavioral: Negative for depression. The patient is not nervous/anxious.     DRUG ALLERGIES:  No Known Allergies VITALS:  Blood pressure (!) 148/69, pulse 70, temperature 98 F (36.7 C), temperature source Oral, resp. rate 18, height 5\' 2"  (1.575 m), weight 142 lb 11.2 oz (64.7 kg), SpO2 98 %. PHYSICAL EXAMINATION:  Physical Exam  Constitutional: She is oriented to person, place, and time and well-developed, well-nourished, and in no distress.  HENT:  Head: Normocephalic.  Mouth/Throat: Oropharynx is clear and moist.  Eyes: Pupils are equal, round, and reactive to light. Conjunctivae and EOM are normal. No scleral icterus.  Neck: Normal range of motion. Neck supple. No JVD present. No tracheal deviation present.  Cardiovascular: Normal rate, regular rhythm and normal heart  sounds.  Exam reveals no gallop.   No murmur heard. Pulmonary/Chest: Effort normal and breath sounds normal. No respiratory distress. She has no wheezes. She has no rales.  Abdominal: Soft. Bowel sounds are normal. She exhibits no distension. There is no tenderness. There is no rebound.  Musculoskeletal: Normal range of motion. She exhibits no edema or tenderness.  Neurological: She is alert and oriented to person, place, and time. No cranial nerve deficit.  Skin: No rash noted. No erythema.  Psychiatric: Affect normal.   LABORATORY PANEL:  Female CBC  Recent Labs Lab 04/06/17 0345  WBC 7.8  HGB 8.5*  HCT 23.9*  PLT 181   ------------------------------------------------------------------------------------------------------------------ Chemistries   Recent Labs Lab 04/05/17 1813  04/06/17 1412  NA 138  < > 138  K 4.0  < > 3.6  CL 102  < > 107  CO2 21*  < > 19*  GLUCOSE 107*  < > 129*  BUN 55*  < > 46*  CREATININE 7.24*  < > 6.90*  CALCIUM 9.5  < > 8.7*  AST 22  --   --   ALT 9*  --   --   ALKPHOS 48  --   --   BILITOT 0.6  --   --   < > = values in this interval not displayed. RADIOLOGY:  Ct Abdomen Pelvis Wo Contrast  Result Date: 04/05/2017 CLINICAL DATA:  Pulsatile abdominal mass. Suspect abdominal aortic aneurysm EXAM: CT ABDOMEN AND PELVIS WITHOUT CONTRAST TECHNIQUE: Multidetector CT imaging of the abdomen and pelvis was performed following the standard protocol without IV contrast. COMPARISON:  None. FINDINGS: Lower chest: Lung bases clear Hepatobiliary: Small calcified hepatic granuloma. No liver mass. Gallbladder and bile ducts normal Pancreas: Negative  Spleen: Negative Adrenals/Urinary Tract: No renal mass or obstruction or stone. Urinary bladder normal Stomach/Bowel: Negative for bowel obstruction. No bowel mass or edema. Normal appendix. Vascular/Lymphatic: Atherosclerotic aorta and iliac arteries. Negative for aortic aneurysm. No retroperitoneal mass Reproductive:  Small uterine fibroids. Other: No free-fluid Musculoskeletal: Lumbar degenerative changes without acute skeletal abnormality. IMPRESSION: Atherosclerotic aorta without aneurysm. No acute abnormality. Electronically Signed   By: Franchot Gallo M.D.   On: 04/05/2017 19:37   Dg Chest 2 View  Result Date: 04/05/2017 CLINICAL DATA:  Cough and wheezing for 1 week EXAM: CHEST  2 VIEW COMPARISON:  None. FINDINGS: Cardiac shadow is within normal limits. Mild aortic calcifications are seen. Lungs are well aerated bilaterally with mild interstitial changes. No focal confluent infiltrate is seen. No sizable effusion is noted. Mild degenerative changes of the thoracic spine are noted. IMPRESSION: Mild interstitial changes which may be chronic in nature. A mild degree of acute bronchitis could not be totally excluded. Electronically Signed   By: Inez Catalina M.D.   On: 04/05/2017 18:44   US Renal  Result Date: 04/05/2017 CLINICAL DATA:  Acute kidney injury. EXAM: RENAL / URINARY TRACT ULTRASOUND COMPLETE COMPARISON:  04/05/2017 FINDINGS: Right Kidney: Length: 13.4 cm. Increased cortical echogenicity. No mass or hydronephrosis visualized. Left Kidney: Length: 13.8 cm. Increased cortical echogenicity. No mass or hydronephrosis visualized. Bladder: Appears normal for degree of bladder distention. IMPRESSION: 1. No hydronephrosis. 2. Bilateral echogenic kidneys compatible with medical renal disease. Electronically Signed   By: Kerby Moors M.D.   On: 04/05/2017 20:43   ASSESSMENT AND PLAN:   Brandi [name]  is a 66 y.o. female with no significant past medical history presents to the hospital secondary to worsening back pain, myalgias and nausea.  #1 acute renal failure with Proteinuria/Hematuria Slightly improving with IV fluid. Follow-up BMP. Follow-up Dr. Keturah Barre recommendation and workup.  #2 anemia-likely anemia of chronic disease. Check serum electrophoresis and also iron studies  #3 hypertension,  malignant -started on Norvasc, also IV hydralazine as needed. Better controlled.  #4 DVT prophylaxis-on subcutaneous heparin  #5 tobacco use disorder-strongly counseled against smoking   All the records are reviewed and case discussed with Care Management/Social Worker. Management plans discussed with the patient, family and they are in agreement.  CODE STATUS: Full Code  TOTAL TIME TAKING CARE OF THIS PATIENT: 36 minutes.   More than 50% of the time was spent in counseling/coordination of care: YES  POSSIBLE D/C IN 3 DAYS, DEPENDING ON CLINICAL CONDITION.   Demetrios Loll M.D on 04/06/2017 at 3:30 PM  Between 7am to 6pm - Pager - [phone]  After 6pm go to [email or web address] - [facility]

## 2017-04-06 NOTE — Progress Notes (Signed)
Initial Nutrition Assessment  DOCUMENTATION CODES:   Not applicable  INTERVENTION:  1. Recommended Ensure Enlive po BID, each supplement provides 350 kcal and 20 grams of protein  NUTRITION DIAGNOSIS:   Inadequate oral intake related to nausea, vomiting as evidenced by per patient/family report.  GOAL:   Patient will meet greater than or equal to 90% of their needs  MONITOR:   PO intake, I & O's, Labs, Supplement acceptance, Weight trends  REASON FOR ASSESSMENT:   Malnutrition Screening Tool    ASSESSMENT:   Brandi [name]  is a 65 y.o. female with no significant past medical history presents to the hospital secondary to worsening back pain, myalgias and nausea. Found to have an acute kidney injury   Spoke with patient at bedside. Reports poor PO intake for 2 weeks related to nausea/vomiting. Complains of gagging/choking with food related to mucus. Could be reflux related as patient complains of symptoms of reflux, or could be related to dehydration and AKI. Was unable to tolerate jello earlier, but did eat eggs, grilled chicken breast and toast and lemonade for breakfast. Denies any chewing problems. States she was 180 pounds 1 year ago before her grandson "got into some trouble," and she began to lose weight. Denies any recent weight loss. NFPE WNL besides mild wasting at temples.  Discussed that she should try to consume ensure during stay, monitor acceptance and overall PO intake.  Medications reviewed and include:  Colace 100mg  BID, NS at 185mL/hr Labs reviewed:  BUN/Creatinine 50/6.70   Diet Order:  Diet Heart Room service appropriate? Yes; Fluid consistency: Thin  Skin:  Reviewed, no issues  Last BM:  04/05/2017  Height:   Ht Readings from Last 1 Encounters:  04/05/17 5\' 2"  (1.575 m)    Weight:   Wt Readings from Last 1 Encounters:  04/05/17 142 lb 11.2 oz (64.7 kg)    Ideal Body Weight:  50 kg  BMI:  Body mass index is 26.1 kg/m.  Estimated  Nutritional Needs:   Kcal:  3500-9381 calories  Protein:  77-97 grams (1.2-1.5g/kg)  Fluid:  Per MD/NP/PA  EDUCATION NEEDS:   No education needs identified at this time  Satira Anis. Chantal Worthey, MS, RD LDN Inpatient Clinical Dietitian Pager [phone]

## 2017-04-06 NOTE — Progress Notes (Signed)
Patient able to take medications PO. Patient attempted to eat jello, unable to tolerate, N/V. Zofran given.

## 2017-04-07 LAB — URINE CULTURE: CULTURE: NO GROWTH

## 2017-04-07 LAB — ANCA TITERS
Atypical P-ANCA titer: <1:20 {titer}
P-ANCA: <1:20 {titer}

## 2017-04-07 LAB — RENAL FUNCTION PANEL
Albumin: 2.7 g/dL — ABNORMAL LOW (ref 3.5–5.0)
Anion gap: 9 (ref 5–15)
BUN: 41 mg/dL — ABNORMAL HIGH (ref 6–20)
CHLORIDE: 112 mmol/L — AB (ref 101–111)
CO2: 21 mmol/L — ABNORMAL LOW (ref 22–32)
CREATININE: 6.3 mg/dL — AB (ref 0.44–1.00)
Calcium: 8.5 mg/dL — ABNORMAL LOW (ref 8.9–10.3)
GFR calc Af Amer: 7 mL/min — ABNORMAL LOW (ref >60)
GFR calc non Af Amer: 6 mL/min — ABNORMAL LOW (ref >60)
GLUCOSE: 91 mg/dL (ref 65–99)
Phosphorus: 4.6 mg/dL (ref 2.5–4.6)
Potassium: 4.2 mmol/L (ref 3.5–5.1)
Sodium: 142 mmol/L (ref 135–145)

## 2017-04-07 LAB — C3 COMPLEMENT: C3 Complement: 120 mg/dL (ref 82–167)

## 2017-04-07 LAB — HEPATITIS PANEL, ACUTE
HCV Ab: <0.1 s/co ratio (ref 0.0–0.9)
HEP A IGM: NEGATIVE
HEP B C IGM: NEGATIVE
Hepatitis B Surface Ag: NEGATIVE

## 2017-04-07 LAB — PROTEIN ELECTROPHORESIS, SERUM
A/G Ratio: 0.6 — ABNORMAL LOW (ref 0.7–1.7)
Albumin ELP: 3.2 g/dL (ref 2.9–4.4)
Alpha-1-Globulin: 0.2 g/dL (ref 0.0–0.4)
Alpha-2-Globulin: 0.7 g/dL (ref 0.4–1.0)
Beta Globulin: 4.1 g/dL — ABNORMAL HIGH (ref 0.7–1.3)
GLOBULIN, TOTAL: 5.5 g/dL — AB (ref 2.2–3.9)
Gamma Globulin: 0.6 g/dL (ref 0.4–1.8)
M-Spike, %: 2.5 g/dL — ABNORMAL HIGH
TOTAL PROTEIN ELP: 8.7 g/dL — AB (ref 6.0–8.5)

## 2017-04-07 LAB — T4, FREE: Free T4: 0.9 ng/dL (ref 0.61–1.12)

## 2017-04-07 LAB — KAPPA/LAMBDA LIGHT CHAINS
Kappa free light chain: 88.6 mg/L — ABNORMAL HIGH (ref 3.3–19.4)
Kappa, lambda light chain ratio: 0.07 — ABNORMAL LOW (ref 0.26–1.65)
LAMDA FREE LIGHT CHAINS: 1298.8 mg/L — AB (ref 5.7–26.3)

## 2017-04-07 LAB — ANA W/REFLEX IF POSITIVE: ANA: NEGATIVE

## 2017-04-07 LAB — C4 COMPLEMENT: Complement C4, Body Fluid: 33 mg/dL (ref 14–44)

## 2017-04-07 LAB — APTT: aPTT: 38 seconds — ABNORMAL HIGH (ref 24–36)

## 2017-04-07 LAB — HIV ANTIBODY (ROUTINE TESTING W REFLEX): HIV Screen 4th Generation wRfx: NONREACTIVE

## 2017-04-07 LAB — PROTIME-INR
INR: 1.09
PROTHROMBIN TIME: 14 s (ref 11.4–15.2)

## 2017-04-07 MED ORDER — HYDRALAZINE HCL 20 MG/ML IJ SOLN: 10.0000 mg | Freq: Once | INTRAMUSCULAR | Status: DC | PRN

## 2017-04-07 MED ORDER — VITAMIN B-12 100 MCG PO TABS
100.0000 ug | ORAL_TABLET | Freq: Every day | ORAL | Status: DC
  Administered 2017-04-08 – 2017-04-11 (×4): 100 ug via ORAL
  Filled 2017-04-07 (×4): qty 1

## 2017-04-07 MED ORDER — HYDRALAZINE HCL 20 MG/ML IJ SOLN
10.0000 mg | Freq: Once | INTRAMUSCULAR | Status: AC | PRN
  Administered 2017-04-07: 10 mg via INTRAVENOUS

## 2017-04-07 MED ORDER — PANTOPRAZOLE SODIUM 40 MG PO TBEC
40.0000 mg | DELAYED_RELEASE_TABLET | Freq: Two times a day (BID) | ORAL | Status: DC
  Administered 2017-04-07 – 2017-04-11 (×9): 40 mg via ORAL
  Filled 2017-04-07 (×9): qty 1

## 2017-04-07 MED ORDER — CYANOCOBALAMIN 1000 MCG/ML IJ SOLN
1000.0000 ug | Freq: Once | INTRAMUSCULAR | Status: AC
  Administered 2017-04-07: 1000 ug via INTRAMUSCULAR
  Filled 2017-04-07: qty 1

## 2017-04-07 NOTE — Progress Notes (Signed)
[facility], [place] 04/07/17  Subjective:   Patient states that she is doing a little better compared to yesterday.  No significant improvement in serum creatinine.  Denies nausea but appetite remains poor.  Getting IV fluids.  No shortness of breath.  No leg edema.   Objective:  Vital signs in last 24 hours:  Temp:  [98.7 F (37.1 C)] 98.7 F (37.1 C) (09/12 1947) Pulse Rate:  [56] 56 (09/12 1947) Resp:  [19] 19 (09/12 1947) BP: (154)/(73) 154/73 (09/12 1947) SpO2:  [100 %] 100 % (09/12 1947)  Weight change:  Filed Weights   04/05/17 1809 04/05/17 2242  Weight: 64.9 kg (143 lb) 64.7 kg (142 lb 11.2 oz)    Intake/Output:    Intake/Output Summary (Last 24 hours) at 04/07/17 1041 Last data filed at 04/07/17 1000  Gross per 24 hour  Intake             3600 ml  Output                0 ml  Net             3600 ml     Physical Exam: General: No acute distress, sitting up in the bed  HEENT Anicteric, moist oral mucous membranes  Neck No masses, no distended neck veins  Pulm/lungs Lungs are clear to auscultation  CVS/Heart Regular rhythm no rub or gallop  Abdomen:  Soft, nontender  Extremities: No edema  Neurologic: Alert, oriented  Skin: No acute rashes          Basic Metabolic Panel:   Recent Labs Lab 04/05/17 1813 04/06/17 0345 04/06/17 1412 04/07/17 0533  NA 138 141 138 142  K 4.0 4.0 3.6 4.2  CL 102 111 107 112*  CO2 21* 21* 19* 21*  GLUCOSE 107* 96 129* 91  BUN 55* 50* 46* 41*  CREATININE 7.24* 6.70* 6.90* 6.30*  CALCIUM 9.5 8.4* 8.7* 8.5*  PHOS  --   --   --  4.6     CBC:  Recent Labs Lab 04/05/17 1813 04/06/17 0345  WBC 7.8 7.8  HGB 9.3* 8.5*  HCT 27.4* 23.9*  MCV 92.9 93.3  PLT 224 181     Lab Results  Component Value Date   HEPBSAG Negative 04/05/2017   HEPBIGM Negative 04/05/2017      Microbiology:  No results found for this or any previous visit (from the past 240 hour(s)).  Coagulation  Studies: No results for input(s): LABPROT, INR in the last 72 hours.  Urinalysis:  Recent Labs  04/05/17 2034  COLORURINE STRAW*  LABSPEC 1.006  PHURINE 5.0  GLUCOSEU NEGATIVE  HGBUR MODERATE*  BILIRUBINUR NEGATIVE  KETONESUR NEGATIVE  PROTEINUR 100*  NITRITE NEGATIVE  LEUKOCYTESUR MODERATE*      Imaging: Ct Abdomen Pelvis Wo Contrast  Result Date: 04/05/2017 CLINICAL DATA:  Pulsatile abdominal mass. Suspect abdominal aortic aneurysm EXAM: CT ABDOMEN AND PELVIS WITHOUT CONTRAST TECHNIQUE: Multidetector CT imaging of the abdomen and pelvis was performed following the standard protocol without IV contrast. COMPARISON:  None. FINDINGS: Lower chest: Lung bases clear Hepatobiliary: Small calcified hepatic granuloma. No liver mass. Gallbladder and bile ducts normal Pancreas: Negative Spleen: Negative Adrenals/Urinary Tract: No renal mass or obstruction or stone. Urinary bladder normal Stomach/Bowel: Negative for bowel obstruction. No bowel mass or edema. Normal appendix. Vascular/Lymphatic: Atherosclerotic aorta and iliac arteries. Negative for aortic aneurysm. No retroperitoneal mass Reproductive: Small uterine fibroids. Other: No free-fluid Musculoskeletal: Lumbar degenerative changes without  acute skeletal abnormality. IMPRESSION: Atherosclerotic aorta without aneurysm. No acute abnormality. Electronically Signed   By: Franchot Gallo M.D.   On: 04/05/2017 19:37   Dg Chest 2 View  Result Date: 04/05/2017 CLINICAL DATA:  Cough and wheezing for 1 week EXAM: CHEST  2 VIEW COMPARISON:  None. FINDINGS: Cardiac shadow is within normal limits. Mild aortic calcifications are seen. Lungs are well aerated bilaterally with mild interstitial changes. No focal confluent infiltrate is seen. No sizable effusion is noted. Mild degenerative changes of the thoracic spine are noted. IMPRESSION: Mild interstitial changes which may be chronic in nature. A mild degree of acute bronchitis could not be totally  excluded. Electronically Signed   By: Inez Catalina M.D.   On: 04/05/2017 18:44   US Renal  Result Date: 04/05/2017 CLINICAL DATA:  Acute kidney injury. EXAM: RENAL / URINARY TRACT ULTRASOUND COMPLETE COMPARISON:  04/05/2017 FINDINGS: Right Kidney: Length: 13.4 cm. Increased cortical echogenicity. No mass or hydronephrosis visualized. Left Kidney: Length: 13.8 cm. Increased cortical echogenicity. No mass or hydronephrosis visualized. Bladder: Appears normal for degree of bladder distention. IMPRESSION: 1. No hydronephrosis. 2. Bilateral echogenic kidneys compatible with medical renal disease. Electronically Signed   By: Kerby Moors M.D.   On: 04/05/2017 20:43     Medications:   . sodium chloride 100 mL/hr at 04/07/17 0625   . amLODipine  10 mg Oral Daily  . cyanocobalamin  1,000 mcg Intramuscular Once  . docusate sodium  100 mg Oral BID  . feeding supplement (ENSURE ENLIVE)  237 mL Oral BID BM  . pneumococcal 23 valent vaccine  0.5 mL Intramuscular Tomorrow-1000  . [START ON 04/08/2017] vitamin B-12  100 mcg Oral Daily   acetaminophen **OR** acetaminophen, alum & mag hydroxide-simeth, hydrALAZINE, ondansetron **OR** ondansetron (ZOFRAN) IV, polyethylene glycol  Assessment/ Plan:  66 y.o. african American female with no known medical problems , who was admitted to [facility] on 04/05/2017 for evaluation of acute kidney injury.  Acute Renal Failure with Proteinuria/Hematuria - No baseline creatinine available - DDX includes prerenal leading to ATN via NSAID use, interstitial nephritis, HTN related kidney disease (chronic?), HIV (NEG), Myeloma, other GN. compliments are normal.  Urine protein to creatinine ratio is 1.5 g Electrolytes and volume status are acceptable.  No acute indication for dialysis at present. Discussed possibility of kidney biopsy with patient.  Explained risks, benefits and alternatives including excessive bleeding requiring surgery, loss of kidney and blood transfusion.   Patient has agreed to proceed.  Will plan for kidney biopsy later this afternoon if blood pressure is controlled.  Hypertension -  avoid ace inhibitor at this time due to ARF  Abnormal TSH Suggesting hyperthyroidism.  Will need endocrine evaluation.  Results for SHERRYLL, SKOCZYLAS (MRN [id number]) as of 04/07/2017 12:35  Ref. Range 04/05/2017 22:43  Hep A Ab, IgM Latest Ref Range: Negative  Negative  Hepatitis B Surface Ag Latest Ref Range: Negative  Negative  Hep B Core Ab, IgM Latest Ref Range: Negative  Negative  HCV Ab Latest Ref Range: 0.0 - 0.9 s/co ratio <0.1  HIV Screen 4th Generation wRfx  Latest Ref Range: Non Reactive  Non Reactive   Results for TACHE, BOBST (MRN [id number]) as of 04/07/2017 12:35  Ref. Range 04/05/2017 20:34  Protein Creatinine Ratio Latest Ref Range: 0.00 - 0.15 mg/mgCre 1.50 (H)     LOS: 2 Murlean Iba 9/13/201810:41 AM  [facility] [place], [place]

## 2017-04-07 NOTE — Progress Notes (Signed)
[place] [facility] [place] NAME: Brandi [name]    MR#:  [id number]  DATE OF BIRTH:  07-14-1951  SUBJECTIVE:  CHIEF COMPLAINT:   Chief Complaint  Patient presents with  . Abdominal Pain  . Shortness of Breath   -patient complains of suprapubic tenderness and also increased frequency of urination. Renal function is improving only slowly. -Urine output has not been documented  REVIEW OF SYSTEMS:  Review of Systems  Constitutional: Positive for malaise/fatigue. Negative for chills and fever.  HENT: Negative for congestion, ear discharge, hearing loss and nosebleeds.   Eyes: Negative for blurred vision and double vision.  Respiratory: Negative for cough, shortness of breath and wheezing.   Cardiovascular: Negative for chest pain and palpitations.  Gastrointestinal: Positive for abdominal pain. Negative for constipation, diarrhea, nausea and vomiting.  Genitourinary: Positive for frequency. Negative for dysuria.  Musculoskeletal: Negative for myalgias.  Neurological: Negative for dizziness, speech change, focal weakness, seizures and headaches.  Psychiatric/Behavioral: Negative for depression.    DRUG ALLERGIES:  No Known Allergies  VITALS:  Blood pressure (!) 175/95, pulse (!) 55, temperature 98.3 F (36.8 C), temperature source Oral, resp. rate 16, height '5\' 2"'  (1.575 m), weight 64.7 kg (142 lb 11.2 oz), SpO2 100 %.  PHYSICAL EXAMINATION:  Physical Exam  GENERAL:  66 y.o.-year-old patient lying in the bed with no acute distress.  EYES: Pupils equal, round, reactive to light and accommodation. No scleral icterus. Extraocular muscles intact.  HEENT: Head atraumatic, normocephalic. Oropharynx and nasopharynx clear.  NECK:  Supple, no jugular venous distention. No thyroid enlargement, no tenderness.  LUNGS: Normal breath sounds bilaterally, no wheezing, rales,rhonchi or crepitation. No use of accessory muscles of respiration.    CARDIOVASCULAR: S1, S2 normal. No murmurs, rubs, or gallops.  ABDOMEN: Soft, nontender except for some discomfort in the suprapubic area, nondistended. Bowel sounds present. No organomegaly or mass.  EXTREMITIES: No pedal edema, cyanosis, or clubbing.  NEUROLOGIC: Cranial nerves II through XII are intact. Muscle strength 5/5 in all extremities. Sensation intact. Gait not checked.  PSYCHIATRIC: The patient is alert and oriented x 3.  SKIN: No obvious rash, lesion, or ulcer.     LABORATORY PANEL:   CBC  Recent Labs Lab 04/06/17 0345  WBC 7.8  HGB 8.5*  HCT 23.9*  PLT 181   ------------------------------------------------------------------------------------------------------------------  Chemistries   Recent Labs Lab 04/05/17 1813  04/07/17 0533  NA 138  < > 142  K 4.0  < > 4.2  CL 102  < > 112*  CO2 21*  < > 21*  GLUCOSE 107*  < > 91  BUN 55*  < > 41*  CREATININE 7.24*  < > 6.30*  CALCIUM 9.5  < > 8.5*  AST 22  --   --   ALT 9*  --   --   ALKPHOS 48  --   --   BILITOT 0.6  --   --   < > = values in this interval not displayed. ------------------------------------------------------------------------------------------------------------------  Cardiac Enzymes No results for input(s): TROPONINI in the last 168 hours. ------------------------------------------------------------------------------------------------------------------  RADIOLOGY:  Ct Abdomen Pelvis Wo Contrast  Result Date: 04/05/2017 CLINICAL DATA:  Pulsatile abdominal mass. Suspect abdominal aortic aneurysm EXAM: CT ABDOMEN AND PELVIS WITHOUT CONTRAST TECHNIQUE: Multidetector CT imaging of the abdomen and pelvis was performed following the standard protocol without IV contrast. COMPARISON:  None. FINDINGS: Lower chest: Lung bases clear Hepatobiliary: Small calcified hepatic granuloma. No liver mass. Gallbladder and bile  ducts normal Pancreas: Negative Spleen: Negative Adrenals/Urinary Tract: No renal mass  or obstruction or stone. Urinary bladder normal Stomach/Bowel: Negative for bowel obstruction. No bowel mass or edema. Normal appendix. Vascular/Lymphatic: Atherosclerotic aorta and iliac arteries. Negative for aortic aneurysm. No retroperitoneal mass Reproductive: Small uterine fibroids. Other: No free-fluid Musculoskeletal: Lumbar degenerative changes without acute skeletal abnormality. IMPRESSION: Atherosclerotic aorta without aneurysm. No acute abnormality. Electronically Signed   By: Franchot Gallo M.D.   On: 04/05/2017 19:37   Dg Chest 2 View  Result Date: 04/05/2017 CLINICAL DATA:  Cough and wheezing for 1 week EXAM: CHEST  2 VIEW COMPARISON:  None. FINDINGS: Cardiac shadow is within normal limits. Mild aortic calcifications are seen. Lungs are well aerated bilaterally with mild interstitial changes. No focal confluent infiltrate is seen. No sizable effusion is noted. Mild degenerative changes of the thoracic spine are noted. IMPRESSION: Mild interstitial changes which may be chronic in nature. A mild degree of acute bronchitis could not be totally excluded. Electronically Signed   By: Inez Catalina M.D.   On: 04/05/2017 18:44   US Renal  Result Date: 04/05/2017 CLINICAL DATA:  Acute kidney injury. EXAM: RENAL / URINARY TRACT ULTRASOUND COMPLETE COMPARISON:  04/05/2017 FINDINGS: Right Kidney: Length: 13.4 cm. Increased cortical echogenicity. No mass or hydronephrosis visualized. Left Kidney: Length: 13.8 cm. Increased cortical echogenicity. No mass or hydronephrosis visualized. Bladder: Appears normal for degree of bladder distention. IMPRESSION: 1. No hydronephrosis. 2. Bilateral echogenic kidneys compatible with medical renal disease. Electronically Signed   By: Kerby Moors M.D.   On: 04/05/2017 20:43    EKG:   Orders placed or performed during the hospital encounter of 04/05/17  . ED EKG  . ED EKG    ASSESSMENT AND PLAN:   Brandi [name]  is a 66 y.o. female with no significant past  medical history presents to the hospital secondary to worsening back pain, myalgias and nausea.  #1 acute renal failure-unknown baseline.  - Partly prerenal, interstitial nephritis vs NSAIDS causing injury,  and also could be from hypertensive induced kidney disease -undiagnosed. - Strict input and output monitoring. Not much improvement noted with gentle hydration. Though slowly improving -increased urine protein also noted. -No acute indication for dialysis is still making urine and potassium is within normal limits -pending studies for multiple [facility] -Nephrology consult appreciated. -will need renal biopsy-cheduled for today  #2 anemia-likely anemia of chronic disease. Pending serum electrophoresis - normal iron studies  #3 hypertension- on Norvasc, also IV hydralazine as needed  #4 DVT prophylaxis-on subcutaneous heparin-stopped for possible renal biopsy  #5 tobacco use disorder-strongly counseled against smoking     All the records are reviewed and case discussed with Care Management/Social Workerr. Management plans discussed with the patient, family and they are in agreement.  CODE STATUS: full code  TOTAL TIME TAKING CARE OF THIS PATIENT: 37 minutes.   POSSIBLE D/C IN 2-3 DAYS, DEPENDING ON CLINICAL CONDITION.   Gladstone Lighter M.D on 04/07/2017 at 11:00 AM  Between 7am to 6pm - Pager - [phone]  After 6pm go to [email or web address] - password [email or web address] [facility]  Office  [phone]  CC: Primary care physician; Center, [facility]

## 2017-04-08 ENCOUNTER — Other Ambulatory Visit: Payer: Self-pay | Admitting: Internal Medicine

## 2017-04-08 ENCOUNTER — Inpatient Hospital Stay: Payer: Medicare Other

## 2017-04-08 DIAGNOSIS — F17210 Nicotine dependence, cigarettes, uncomplicated: Secondary | ICD-10-CM

## 2017-04-08 DIAGNOSIS — Z79899 Other long term (current) drug therapy: Secondary | ICD-10-CM

## 2017-04-08 DIAGNOSIS — C9000 Multiple myeloma not having achieved remission: Secondary | ICD-10-CM

## 2017-04-08 DIAGNOSIS — D649 Anemia, unspecified: Secondary | ICD-10-CM

## 2017-04-08 DIAGNOSIS — R339 Retention of urine, unspecified: Secondary | ICD-10-CM

## 2017-04-08 DIAGNOSIS — C9002 Multiple myeloma in relapse: Secondary | ICD-10-CM

## 2017-04-08 DIAGNOSIS — R630 Anorexia: Secondary | ICD-10-CM

## 2017-04-08 DIAGNOSIS — N179 Acute kidney failure, unspecified: Principal | ICD-10-CM

## 2017-04-08 DIAGNOSIS — R634 Abnormal weight loss: Secondary | ICD-10-CM

## 2017-04-08 DIAGNOSIS — R200 Anesthesia of skin: Secondary | ICD-10-CM

## 2017-04-08 LAB — BASIC METABOLIC PANEL
ANION GAP: 9 (ref 5–15)
BUN: 36 mg/dL — ABNORMAL HIGH (ref 6–20)
CHLORIDE: 113 mmol/L — AB (ref 101–111)
CO2: 21 mmol/L — ABNORMAL LOW (ref 22–32)
CREATININE: 5.67 mg/dL — AB (ref 0.44–1.00)
Calcium: 8.5 mg/dL — ABNORMAL LOW (ref 8.9–10.3)
GFR calc non Af Amer: 7 mL/min — ABNORMAL LOW (ref >60)
GFR, EST AFRICAN AMERICAN: 8 mL/min — AB (ref >60)
Glucose, Bld: 94 mg/dL (ref 65–99)
POTASSIUM: 3.8 mmol/L (ref 3.5–5.1)
SODIUM: 143 mmol/L (ref 135–145)

## 2017-04-08 LAB — COMPLEMENT, TOTAL: Compl, Total (CH50): >60 U/mL (ref >41)

## 2017-04-08 LAB — URIC ACID: Uric Acid, Serum: 7.4 mg/dL — ABNORMAL HIGH (ref 2.3–6.6)

## 2017-04-08 LAB — LACTATE DEHYDROGENASE: LDH: 237 U/L — AB (ref 98–192)

## 2017-04-08 MED ORDER — PALONOSETRON HCL INJECTION 0.25 MG/5ML
0.2500 mg | Freq: Once | INTRAVENOUS | Status: AC
  Administered 2017-04-08: 16:00:00 0.25 mg via INTRAVENOUS
  Filled 2017-04-08: qty 5

## 2017-04-08 MED ORDER — HEPARIN SODIUM (PORCINE) 5000 UNIT/ML IJ SOLN
5000.0000 [IU] | Freq: Three times a day (TID) | INTRAMUSCULAR | Status: DC
  Administered 2017-04-08 – 2017-04-10 (×6): 5000 [IU] via SUBCUTANEOUS
  Filled 2017-04-08 (×6): qty 1

## 2017-04-08 MED ORDER — BORTEZOMIB CHEMO SQ INJECTION 3.5 MG (2.5MG/ML)
1.3000 mg/m2 | Freq: Once | INTRAMUSCULAR | Status: AC
  Administered 2017-04-08: 2.25 mg via SUBCUTANEOUS
  Filled 2017-04-08: qty 0.9

## 2017-04-08 MED ORDER — SODIUM CHLORIDE 0.9 % IV SOLN: INTRAVENOUS | Status: DC

## 2017-04-08 MED ORDER — DEXAMETHASONE 4 MG PO TABS
40.0000 mg | ORAL_TABLET | Freq: Every day | ORAL | Status: DC
  Administered 2017-04-09 – 2017-04-10 (×2): 40 mg via ORAL
  Filled 2017-04-08 (×3): qty 10

## 2017-04-08 NOTE — Consult Note (Signed)
[place] NOTE  Patient Care Team: Center, [facility] as PCP - General (General Practice)  CHIEF COMPLAINTS/PURPOSE OF CONSULTATION: Multiple myeloma  HISTORY OF PRESENTING ILLNESS:  Brandi [name] 66 y.o.  female with no significant past medical history [ not seen physicians for many years] is currently admitted the hospital for lower abdominal pain/urinary retention. However further workup showed patient had acute renal failure with creatinine up to 7; anemia hemoglobin around 8-9 with normal platelets. Patient noted to have M protein of 2.5 g; also kappa/lamda light chains- ratio of 0.07 [88/1298]. Patient's calcium has been normal. Skeletal survey no evidence of any lytic lesions.  Patient notes to have lost about 40 pounds over the last 2 years. Her appetite is poor in the last few weeks. Denies any significant tingling and numbness except for mild numbness in her right upper extremity. Otherwise she is fairly active. She denies any significant medical comorbidities.  ROS: A complete 10 point review of system is done which is negative except mentioned above in history of present illness  MEDICAL HISTORY:  History reviewed. No pertinent past medical history.  SURGICAL HISTORY: History reviewed. No pertinent surgical history.  SOCIAL HISTORY: She lives in [place] with her son. Her daughter lives close by. She smokes about 4-5 cigarettes a day. No alcohol. She used to work in a [employer]. Currently retired.  Social History   Social History  . Marital status: Single    Spouse name: N/A  . Number of children: N/A  . Years of education: N/A   Occupational History  . Not on file.   Social History Main Topics  . Smoking status: Current Every Day Smoker    Packs/day: 0.50    Types: Cigarettes  . Smokeless tobacco: Never Used  . Alcohol use No  . Drug use: No  . Sexual activity: Not on file   Other Topics Concern  . Not on file   Social  History Narrative   Lives at home with family. Independent at baseline    FAMILY HISTORY: Family History  Problem Relation Age of Onset  . Pneumonia Mother   . Seizures Father     ALLERGIES:  has No Known Allergies.  MEDICATIONS:  Current Facility-Administered Medications  Medication Dose Route Frequency Provider Last Rate Last Dose  . 0.9 %  sodium chloride infusion   Intravenous Continuous Gladstone Lighter, MD 100 mL/hr at 04/08/17 0121    . acetaminophen (TYLENOL) tablet 650 mg  650 mg Oral Q6H PRN Gladstone Lighter, MD       Or  . acetaminophen (TYLENOL) suppository 650 mg  650 mg Rectal Q6H PRN Gladstone Lighter, MD      . alum & mag hydroxide-simeth (MAALOX/MYLANTA) 200-200-20 MG/5ML suspension 30 mL  30 mL Oral Q4H PRN Demetrios Loll, MD   30 mL at 04/06/17 2244  . amLODipine (NORVASC) tablet 10 mg  10 mg Oral Daily Demetrios Loll, MD   10 mg at 04/08/17 1031  . bortezomib SQ (VELCADE) chemo injection 2.25 mg  1.3 mg/m2 (Treatment Plan Recorded) Subcutaneous Once Charlaine Dalton R, MD      . dexamethasone (DECADRON) tablet 40 mg  40 mg Oral Daily Charlaine Dalton R, MD      . docusate sodium (COLACE) capsule 100 mg  100 mg Oral BID Gladstone Lighter, MD   100 mg at 04/08/17 1031  . feeding supplement (ENSURE ENLIVE) (ENSURE ENLIVE) liquid 237 mL  237 mL Oral BID BM Demetrios Loll,  MD   237 mL at 04/07/17 1000  . heparin injection 5,000 Units  5,000 Units Subcutaneous Q8H Gladstone Lighter, MD      . hydrALAZINE (APRESOLINE) injection 10 mg  10 mg Intravenous Q6H PRN Gladstone Lighter, MD      . hydrALAZINE (APRESOLINE) injection 10 mg  10 mg Intravenous Once PRN Murlean Iba, MD      . ondansetron (ZOFRAN) tablet 4 mg  4 mg Oral Q6H PRN Gladstone Lighter, MD   4 mg at 04/07/17 1748   Or  . ondansetron (ZOFRAN) injection 4 mg  4 mg Intravenous Q6H PRN Gladstone Lighter, MD   4 mg at 04/05/17 2320  . palonosetron (ALOXI) injection 0.25 mg  0.25 mg Intravenous Once  Charlaine Dalton R, MD      . pantoprazole (PROTONIX) EC tablet 40 mg  40 mg Oral BID Gladstone Lighter, MD   40 mg at 04/08/17 1031  . pneumococcal 23 valent vaccine (PNU-IMMUNE) injection 0.5 mL  0.5 mL Intramuscular Tomorrow-1000 Demetrios Loll, MD      . polyethylene glycol (MIRALAX / GLYCOLAX) packet 17 g  17 g Oral Daily PRN Gladstone Lighter, MD   17 g at 04/07/17 1059  . vitamin B-12 (CYANOCOBALAMIN) tablet 100 mcg  100 mcg Oral Daily Gladstone Lighter, MD   100 mcg at 04/08/17 1031      .  PHYSICAL EXAMINATION:  Vitals:   04/07/17 2021 04/08/17 0755  BP: (!) 167/80 (!) 144/70  Pulse: 79 (!) 59  Resp: 18 18  Temp: 98.5 F (36.9 C) 98.7 F (37.1 C)  SpO2: 100% 100%   Filed Weights   04/05/17 1809 04/05/17 2242  Weight: 143 lb (64.9 kg) 142 lb 11.2 oz (64.7 kg)    GENERAL: Well-nourished well-developed; Alert, no distress and comfortableAlone. EYES: no pallor or icterus OROPHARYNX: no thrush or ulceration. NECK: supple, no masses felt LYMPH:  no palpable lymphadenopathy in the cervical, axillary or inguinal regions LUNGS: decreased breath sounds to auscultation at bases and  No wheeze or crackles HEART/CVS: regular rate & rhythm and no murmurs; No lower extremity edema ABDOMEN: abdomen soft, non-tender and normal bowel sounds Musculoskeletal:no cyanosis of digits and no clubbing  PSYCH: alert & oriented x 3 with fluent speech NEURO: no focal motor/sensory deficits SKIN:  no rashes or significant lesions  LABORATORY DATA:  I have reviewed the data as listed Lab Results  Component Value Date   WBC 7.8 04/06/2017   HGB 8.5 (L) 04/06/2017   HCT 23.9 (L) 04/06/2017   MCV 93.3 04/06/2017   PLT 181 04/06/2017    Recent Labs  04/05/17 1813  04/06/17 1412 04/07/17 0533 04/08/17 0416  NA 138  < > 138 142 143  K 4.0  < > 3.6 4.2 3.8  CL 102  < > 107 112* 113*  CO2 21*  < > 19* 21* 21*  GLUCOSE 107*  < > 129* 91 94  BUN 55*  < > 46* 41* 36*  CREATININE 7.24*   < > 6.90* 6.30* 5.67*  CALCIUM 9.5  < > 8.7* 8.5* 8.5*  GFRNONAA 5*  < > 6* 6* 7*  GFRAA 6*  < > 6* 7* 8*  PROT 10.1*  --   --   --   --   ALBUMIN 3.7  --   --  2.7*  --   AST 22  --   --   --   --   ALT 9*  --   --   --   --  ALKPHOS 48  --   --   --   --   BILITOT 0.6  --   --   --   --   < > = values in this interval not displayed.  RADIOGRAPHIC STUDIES: I have personally reviewed the radiological images as listed and agreed with the findings in the report. Ct Abdomen Pelvis Wo Contrast  Result Date: 04/05/2017 CLINICAL DATA:  Pulsatile abdominal mass. Suspect abdominal aortic aneurysm EXAM: CT ABDOMEN AND PELVIS WITHOUT CONTRAST TECHNIQUE: Multidetector CT imaging of the abdomen and pelvis was performed following the standard protocol without IV contrast. COMPARISON:  None. FINDINGS: Lower chest: Lung bases clear Hepatobiliary: Small calcified hepatic granuloma. No liver mass. Gallbladder and bile ducts normal Pancreas: Negative Spleen: Negative Adrenals/Urinary Tract: No renal mass or obstruction or stone. Urinary bladder normal Stomach/Bowel: Negative for bowel obstruction. No bowel mass or edema. Normal appendix. Vascular/Lymphatic: Atherosclerotic aorta and iliac arteries. Negative for aortic aneurysm. No retroperitoneal mass Reproductive: Small uterine fibroids. Other: No free-fluid Musculoskeletal: Lumbar degenerative changes without acute skeletal abnormality. IMPRESSION: Atherosclerotic aorta without aneurysm. No acute abnormality. Electronically Signed   By: Franchot Gallo M.D.   On: 04/05/2017 19:37   Dg Chest 2 View  Result Date: 04/05/2017 CLINICAL DATA:  Cough and wheezing for 1 week EXAM: CHEST  2 VIEW COMPARISON:  None. FINDINGS: Cardiac shadow is within normal limits. Mild aortic calcifications are seen. Lungs are well aerated bilaterally with mild interstitial changes. No focal confluent infiltrate is seen. No sizable effusion is noted. Mild degenerative changes of the  thoracic spine are noted. IMPRESSION: Mild interstitial changes which may be chronic in nature. A mild degree of acute bronchitis could not be totally excluded. Electronically Signed   By: Inez Catalina M.D.   On: 04/05/2017 18:44   US Renal  Result Date: 04/05/2017 CLINICAL DATA:  Acute kidney injury. EXAM: RENAL / URINARY TRACT ULTRASOUND COMPLETE COMPARISON:  04/05/2017 FINDINGS: Right Kidney: Length: 13.4 cm. Increased cortical echogenicity. No mass or hydronephrosis visualized. Left Kidney: Length: 13.8 cm. Increased cortical echogenicity. No mass or hydronephrosis visualized. Bladder: Appears normal for degree of bladder distention. IMPRESSION: 1. No hydronephrosis. 2. Bilateral echogenic kidneys compatible with medical renal disease. Electronically Signed   By: Kerby Moors M.D.   On: 04/05/2017 20:43   Dg Bone Survey Met  Result Date: 04/08/2017 CLINICAL DATA:  Multiple myeloma. EXAM: METASTATIC BONE SURVEY COMPARISON:  Chest x-ray 04/05/2017 FINDINGS: Lateral image of the skull shows no bone lesion. There is heterogeneous density from hair braids. AP view of the chest shows no definite rib lesion or shoulder girdle lesion. Chronic aortic tortuosity. Mild atelectasis at the right base. No edema, effusion, or pneumothorax. No evidence of cervical spine lesion. Bulky spondylotic spurring in the mid and lower cervical spine. Diffuse cervical facet arthropathy with spurring. Bilateral upper extremities showed no focal bone lesion. There is an osteopenic appearance which accounts for trabecular prominence diffusely and symmetrically in the humerus. No bony pelvis or femoral lesion is seen. There is osteopenia and atherosclerosis. No tibia or fibula lesion is noted. No visible spinal lesion. There are hypoplastic T12 ribs. There is an L2 right lamina lesion by CTA 04/05/2017. Prominent lumbar facet arthropathy with L4-5 grade 1 anterolisthesis. IMPRESSION: 1. No visible myeloma on this bone survey. In  retrospect, there is a L2 right lamina lesion on 04/05/2017 abdominal CT. 2. Mild right base atelectasis, new from 3 days ago. 3. Osteopenia. Electronically Signed   By: Neva Seat.D.  On: 04/08/2017 10:11    ASSESSMENT & PLAN:   66 year old patient currently admitted to the hospital for acute renal failure-  # Plasma cell dyscrasias- highly suggestive of multiple myeloma given the elevated M protein/abnormal light chain ratio. Symptomatic myeloma. Question amyloidosis. However he would recommend holding of kidney biopsy; and plan bone marrow biopsy ASAP.   # Given the acute renal failure- I would recommend starting the patient on pulse dose of dexamethasone 40 mg daily for 4 days. Also start the patient on Velcade subcutaneous twice weekly. Discussed the potential side effects of Velcade including but not limited to nausea vomiting diarrhea, low blood counts and also neuropathy; and the risk of shingles. Discussed the urgency to treat is to help avoid worsening renal failure. The Patient understands the treatments are palliative not curative.   #  I have spoken to patient's daughter [name]- [phone] over the phone; spoken to the patient detail about the above. She is in agreement. Discussed with the nurses/staff/pharmacy.   Thank you Dr.Kalisetti for allowing me to participate in the care of your pleasant patient. Please do not hesitate to contact me with questions or concerns in the interim. Also, spoke to Dr.Singh of the above plan.   All questions were answered. The patient knows to call the clinic with any problems, questions or concerns.    [name], MD 04/08/2017 3:21 PM

## 2017-04-08 NOTE — Care Management Important Message (Signed)
Important Message  Patient Details  Name: CAMYLA CAMPOSANO MRN: [id number] Date of Birth: [birth year]   Medicare Important Message Given:  Yes    Jolly Mango, RN 04/08/2017, 9:22 AM

## 2017-04-08 NOTE — Progress Notes (Signed)
Pt alert and oriented. No complaints during the night. Voiding without difficulty. Iv infusing without difficulty. Renal biopsy now scheduled for Monday.

## 2017-04-08 NOTE — Progress Notes (Signed)
Chaplain received a page for pt and family in [id number]. Pt had received bad news and the pt and family were not handling it well. CH met with pt and family. Pt talked about her parents and how she was raised, talked about her 3 children, nine grandchildren, and 4 great grandchildren. Pt talked about how she has always helped others and the joy she finds in helping others. Pt was confident she will survive this trail and requested for prayers and support. CH offered encouragement, spiritual support, prayer and pastoral presence to pt, her family, and family friends who were present in the Rm. [place] will follow up pt as needed.   04/08/17 1800  Clinical Encounter Type  Visited With Patient and family together  Visit Type Initial;Spiritual support;Other (Comment)  Referral From Nurse  Consult/Referral To Chaplain  Spiritual Encounters  Spiritual Needs Prayer;Emotional;Other (Comment)

## 2017-04-08 NOTE — Progress Notes (Signed)
Report given to Maddy on 1C. Patient being transferred for chemotherapy.

## 2017-04-08 NOTE — Progress Notes (Signed)
[facility], [place] 04/08/17  Subjective:     Denies nausea but appetite remains poor.  Getting IV fluids.  No shortness of breath.  No leg edema. Renal biopsy could not be done yesterday due to blood pressure being too high Patient has significantly abnormal Abnormal Kappa to lambda light chain ratio, positive M spike in SPEP Serum creatinine improved from 6.3 down to 5.6 today UOP 1700  Objective:  Vital signs in last 24 hours:  Temp:  [98.5 F (36.9 C)-98.7 F (37.1 C)] 98.7 F (37.1 C) (09/14 0755) Pulse Rate:  [51-79] 59 (09/14 0755) Resp:  [18] 18 (09/14 0755) BP: (144-173)/(70-83) 144/70 (09/14 0755) SpO2:  [100 %] 100 % (09/14 0755)  Weight change:  Filed Weights   04/05/17 1809 04/05/17 2242  Weight: 64.9 kg (143 lb) 64.7 kg (142 lb 11.2 oz)    Intake/Output:    Intake/Output Summary (Last 24 hours) at 04/08/17 1200 Last data filed at 04/08/17 0941  Gross per 24 hour  Intake             1440 ml  Output             1700 ml  Net             -260 ml     Physical Exam: General: No acute distress, sitting up in the bed  HEENT Anicteric, moist oral mucous membranes  Neck No masses, no distended neck veins  Pulm/lungs Lungs are clear to auscultation  CVS/Heart Regular rhythm no rub or gallop  Abdomen:  Soft, nontender  Extremities: No edema  Neurologic: Alert, oriented  Skin: No acute rashes          Basic Metabolic Panel:   Recent Labs Lab 04/05/17 1813 04/06/17 0345 04/06/17 1412 04/07/17 0533 04/08/17 0416  NA 138 141 138 142 143  K 4.0 4.0 3.6 4.2 3.8  CL 102 111 107 112* 113*  CO2 21* 21* 19* 21* 21*  GLUCOSE 107* 96 129* 91 94  BUN 55* 50* 46* 41* 36*  CREATININE 7.24* 6.70* 6.90* 6.30* 5.67*  CALCIUM 9.5 8.4* 8.7* 8.5* 8.5*  PHOS  --   --   --  4.6  --      CBC:  Recent Labs Lab 04/05/17 1813 04/06/17 0345  WBC 7.8 7.8  HGB 9.3* 8.5*  HCT 27.4* 23.9*  MCV 92.9 93.3  PLT 224 181      Lab  Results  Component Value Date   HEPBSAG Negative 04/05/2017   HEPBIGM Negative 04/05/2017      Microbiology:  Recent Results (from the past 240 hour(s))  Urine Culture     Status: None   Collection Time: 04/06/17  6:21 PM  Result Value Ref Range Status   Specimen Description URINE, RANDOM  Final   Special Requests NONE  Final   Culture   Final    NO GROWTH Performed at [facility], [address] [place], [place] [address]    Report Status 04/07/2017 FINAL  Final    Coagulation Studies:  Recent Labs  04/07/17 1140  LABPROT 14.0  INR 1.09    Urinalysis:  Recent Labs  04/05/17 2034  COLORURINE STRAW*  LABSPEC 1.006  PHURINE 5.0  GLUCOSEU NEGATIVE  HGBUR MODERATE*  BILIRUBINUR NEGATIVE  KETONESUR NEGATIVE  PROTEINUR 100*  NITRITE NEGATIVE  LEUKOCYTESUR MODERATE*      Imaging: Dg Bone Survey Met  Result Date: 04/08/2017 CLINICAL DATA:  Multiple myeloma. EXAM:  METASTATIC BONE SURVEY COMPARISON:  Chest x-ray 04/05/2017 FINDINGS: Lateral image of the skull shows no bone lesion. There is heterogeneous density from hair braids. AP view of the chest shows no definite rib lesion or shoulder girdle lesion. Chronic aortic tortuosity. Mild atelectasis at the right base. No edema, effusion, or pneumothorax. No evidence of cervical spine lesion. Bulky spondylotic spurring in the mid and lower cervical spine. Diffuse cervical facet arthropathy with spurring. Bilateral upper extremities showed no focal bone lesion. There is an osteopenic appearance which accounts for trabecular prominence diffusely and symmetrically in the humerus. No bony pelvis or femoral lesion is seen. There is osteopenia and atherosclerosis. No tibia or fibula lesion is noted. No visible spinal lesion. There are hypoplastic T12 ribs. There is an L2 right lamina lesion by CTA 04/05/2017. Prominent lumbar facet arthropathy with L4-5 grade 1 anterolisthesis. IMPRESSION: 1. No visible myeloma on this bone  survey. In retrospect, there is a L2 right lamina lesion on 04/05/2017 abdominal CT. 2. Mild right base atelectasis, new from 3 days ago. 3. Osteopenia. Electronically Signed   By: Monte Fantasia M.D.   On: 04/08/2017 10:11     Medications:   . sodium chloride 100 mL/hr at 04/08/17 0121   . amLODipine  10 mg Oral Daily  . docusate sodium  100 mg Oral BID  . feeding supplement (ENSURE ENLIVE)  237 mL Oral BID BM  . pantoprazole  40 mg Oral BID  . pneumococcal 23 valent vaccine  0.5 mL Intramuscular Tomorrow-1000  . vitamin B-12  100 mcg Oral Daily   acetaminophen **OR** acetaminophen, alum & mag hydroxide-simeth, hydrALAZINE, hydrALAZINE, ondansetron **OR** ondansetron (ZOFRAN) IV, polyethylene glycol  Assessment/ Plan:  66 y.o. african American female with no known medical problems , who was admitted to [facility] on 04/05/2017 for evaluation of acute kidney injury.  Acute Renal Failure with Proteinuria/Hematuria - No baseline creatinine available -  With abnormal Kappa Lambda ratio and positive serum M spike, suspicious for myeloma kidney. - Hematology evaluation is pending. Skeletal survey is negative but possibility of L2 right lamina lesion in abdominal CT - Patient will likely get bone marrow biopsy now.  We'll defer renal biopsy. - Electrolytes and volume status are acceptable.  No acute indication for dialysis at present. - continue IV fluids  Hypertension -  avoid ace inhibitor at this time due to ARF  Abnormal TSH Suggesting hyperthyroidism.  Will need endocrine evaluation.     LOS: 3 [facility] 9/14/201812:00 PM  [facility] [place], [place]

## 2017-04-08 NOTE — Progress Notes (Signed)
START ON PATHWAY REGIMEN - Multiple Myeloma and Other Plasma Cell Dyscrasias     A cycle is every 21 days:     Bortezomib      Cyclophosphamide      Dexamethasone   **Always confirm dose/schedule in your pharmacy ordering system**    Patient Characteristics: Newly Diagnosed, Transplant Eligible, Unknown or Awaiting Test Results R-ISS Staging: Unknown Disease Classification: Newly Diagnosed Is Patient Eligible for Transplant<= Transplant Eligible Risk Status: Awaiting Test Results Intent of Therapy: Non-Curative / Palliative Intent, Discussed with Patient

## 2017-04-08 NOTE — Progress Notes (Signed)
[place] at [place] NAME: Brandi [name]    MR#:  [id number]  DATE OF BIRTH:  [birth year]  SUBJECTIVE:  CHIEF COMPLAINT:   Chief Complaint  Patient presents with  . Abdominal Pain  . Shortness of Breath   - complains of some low back pain. Overall improving. Feels stronger today. -Renal function improving  REVIEW OF SYSTEMS:  Review of Systems  Constitutional: Positive for malaise/fatigue. Negative for chills and fever.  HENT: Negative for congestion, ear discharge, hearing loss and nosebleeds.   Eyes: Negative for blurred vision and double vision.  Respiratory: Negative for cough, shortness of breath and wheezing.   Cardiovascular: Negative for chest pain and palpitations.  Gastrointestinal: Positive for abdominal pain. Negative for constipation, diarrhea, nausea and vomiting.  Genitourinary: Negative for dysuria and frequency.  Musculoskeletal: Negative for myalgias.  Neurological: Negative for dizziness, speech change, focal weakness, seizures and headaches.  Psychiatric/Behavioral: Negative for depression.    DRUG ALLERGIES:  No Known Allergies  VITALS:  Blood pressure (!) 144/70, pulse (!) 59, temperature 98.7 F (37.1 C), temperature source Oral, resp. rate 18, height '5\' 2"'  (1.575 m), weight 64.7 kg (142 lb 11.2 oz), SpO2 100 %.  PHYSICAL EXAMINATION:  Physical Exam  GENERAL:  66 y.o.-year-old patient lying in the bed with no acute distress.  EYES: Pupils equal, round, reactive to light and accommodation. No scleral icterus. Extraocular muscles intact.  HEENT: Head atraumatic, normocephalic. Oropharynx and nasopharynx clear.  NECK:  Supple, no jugular venous distention. No thyroid enlargement, no tenderness.  LUNGS: Normal breath sounds bilaterally, no wheezing, rales,rhonchi or crepitation. No use of accessory muscles of respiration.  CARDIOVASCULAR: S1, S2 normal. No murmurs, rubs, or gallops.  ABDOMEN: Soft,  nontender except for some discomfort in the suprapubic area, nondistended. Bowel sounds present. No organomegaly or mass.  EXTREMITIES: No pedal edema, cyanosis, or clubbing.  NEUROLOGIC: Cranial nerves II through XII are intact. Muscle strength 5/5 in all extremities. Sensation intact. Gait not checked.  PSYCHIATRIC: The patient is alert and oriented x 3.  SKIN: No obvious rash, lesion, or ulcer.     LABORATORY PANEL:   CBC  Recent Labs Lab 04/06/17 0345  WBC 7.8  HGB 8.5*  HCT 23.9*  PLT 181   ------------------------------------------------------------------------------------------------------------------  Chemistries   Recent Labs Lab 04/05/17 1813  04/08/17 0416  NA 138  < > 143  K 4.0  < > 3.8  CL 102  < > 113*  CO2 21*  < > 21*  GLUCOSE 107*  < > 94  BUN 55*  < > 36*  CREATININE 7.24*  < > 5.67*  CALCIUM 9.5  < > 8.5*  AST 22  --   --   ALT 9*  --   --   ALKPHOS 48  --   --   BILITOT 0.6  --   --   < > = values in this interval not displayed. ------------------------------------------------------------------------------------------------------------------  Cardiac Enzymes No results for input(s): TROPONINI in the last 168 hours. ------------------------------------------------------------------------------------------------------------------  RADIOLOGY:  Dg Bone Survey Met  Result Date: 04/08/2017 CLINICAL DATA:  Multiple myeloma. EXAM: METASTATIC BONE SURVEY COMPARISON:  Chest x-ray 04/05/2017 FINDINGS: Lateral image of the skull shows no bone lesion. There is heterogeneous density from hair braids. AP view of the chest shows no definite rib lesion or shoulder girdle lesion. Chronic aortic tortuosity. Mild atelectasis at the right base. No edema, effusion, or pneumothorax. No evidence of cervical spine lesion. Bulky  spondylotic spurring in the mid and lower cervical spine. Diffuse cervical facet arthropathy with spurring. Bilateral upper extremities showed no  focal bone lesion. There is an osteopenic appearance which accounts for trabecular prominence diffusely and symmetrically in the humerus. No bony pelvis or femoral lesion is seen. There is osteopenia and atherosclerosis. No tibia or fibula lesion is noted. No visible spinal lesion. There are hypoplastic T12 ribs. There is an L2 right lamina lesion by CTA 04/05/2017. Prominent lumbar facet arthropathy with L4-5 grade 1 anterolisthesis. IMPRESSION: 1. No visible myeloma on this bone survey. In retrospect, there is a L2 right lamina lesion on 04/05/2017 abdominal CT. 2. Mild right base atelectasis, new from 3 days ago. 3. Osteopenia. Electronically Signed   By: Monte Fantasia M.D.   On: 04/08/2017 10:11    EKG:   Orders placed or performed during the hospital encounter of 04/05/17  . ED EKG  . ED EKG    ASSESSMENT AND PLAN:   Brandi [name]  is a 66 y.o. female with no significant past medical history presents to the hospital secondary to worsening back pain, myalgias and nausea.  #1 acute renal failure- concern for myeloma kidney at this time.  - also has undiagnosed hypertension. - appreciate nephrology consult. Currently receiving IV fluids and improving renal function. -Serological workup showing elevated M spike with increased free light chains. -Renal biopsy canceled yesterday due to hypertension. Might need a bone marrow biopsy at this time. -No acute indication for dialysis  #2 elevated M spike- questionable underlying myeloma. Hematology consult requested. -Skeletal survey ordered and it is negative  #3 anemia-likely anemia of chronic disease.  - normal iron studies  #4 hypertension- on Norvasc, also IV hydralazine as needed  #5 DVT prophylaxis-on subcutaneous heparin  #6 tobacco use disorder-strongly counseled against smoking     All the records are reviewed and case discussed with Care Management/Social Workerr. Management plans discussed with the patient, family  and they are in agreement.  CODE STATUS: full code  TOTAL TIME TAKING CARE OF THIS PATIENT: 37 minutes.   POSSIBLE D/C IN 2-3 DAYS, DEPENDING ON CLINICAL CONDITION.   Gladstone Lighter M.D on 04/08/2017 at 12:10 PM  Between 7am to 6pm - Pager - [phone]  After 6pm go to [email or web address] - password [email or web address] [facility]  Office  [phone]  CC: Primary care physician; Center, [facility]

## 2017-04-09 DIAGNOSIS — G8929 Other chronic pain: Secondary | ICD-10-CM

## 2017-04-09 DIAGNOSIS — R05 Cough: Secondary | ICD-10-CM

## 2017-04-09 DIAGNOSIS — M549 Dorsalgia, unspecified: Secondary | ICD-10-CM

## 2017-04-09 LAB — BASIC METABOLIC PANEL
Anion gap: 8 (ref 5–15)
BUN: 29 mg/dL — AB (ref 6–20)
CO2: 22 mmol/L (ref 22–32)
CREATININE: 4.9 mg/dL — AB (ref 0.44–1.00)
Calcium: 8.6 mg/dL — ABNORMAL LOW (ref 8.9–10.3)
Chloride: 112 mmol/L — ABNORMAL HIGH (ref 101–111)
GFR calc Af Amer: 10 mL/min — ABNORMAL LOW (ref >60)
GFR calc non Af Amer: 8 mL/min — ABNORMAL LOW (ref >60)
GLUCOSE: 91 mg/dL (ref 65–99)
POTASSIUM: 3.7 mmol/L (ref 3.5–5.1)
SODIUM: 142 mmol/L (ref 135–145)

## 2017-04-09 MED ORDER — TEMAZEPAM 7.5 MG PO CAPS
7.5000 mg | ORAL_CAPSULE | Freq: Every day | ORAL | Status: DC
  Administered 2017-04-09 – 2017-04-10 (×2): 7.5 mg via ORAL
  Filled 2017-04-09 (×2): qty 1

## 2017-04-09 NOTE — Progress Notes (Signed)
[facility], [place] 04/09/17  Subjective:    Denies nausea but appetite remains poor.  Getting IV fluids.  No shortness of breath.  No leg edema.  Patient was started on Velcade and dexamethasone chemotherapy Tolerated well Serum creatinine is improved from 5.6-4.90 today  Objective:  Vital signs in last 24 hours:  Temp:  [98.3 F (36.8 C)-98.5 F (36.9 C)] 98.5 F (36.9 C) (09/15 0417) Pulse Rate:  [62-70] 62 (09/15 0417) Resp:  [18] 18 (09/15 0417) BP: (136-144)/(65-70) 136/65 (09/15 0417) SpO2:  [98 %-99 %] 98 % (09/15 0417)  Weight change:  Filed Weights   04/05/17 1809 04/05/17 2242  Weight: 64.9 kg (143 lb) 64.7 kg (142 lb 11.2 oz)    Intake/Output:    Intake/Output Summary (Last 24 hours) at 04/09/17 1214 Last data filed at 04/09/17 1012  Gross per 24 hour  Intake             1463 ml  Output                0 ml  Net             1463 ml     Physical Exam: General: No acute distress, sitting up in the bed  HEENT Anicteric, moist oral mucous membranes  Neck No masses, no distended neck veins  Pulm/lungs Lungs are clear to auscultation  CVS/Heart Regular rhythm no rub or gallop  Abdomen:  Soft, nontender  Extremities: No edema  Neurologic: Alert, oriented  Skin: No acute rashes          Basic Metabolic Panel:   Recent Labs Lab 04/06/17 0345 04/06/17 1412 04/07/17 0533 04/08/17 0416 04/09/17 0445  NA 141 138 142 143 142  K 4.0 3.6 4.2 3.8 3.7  CL 111 107 112* 113* 112*  CO2 21* 19* 21* 21* 22  GLUCOSE 96 129* 91 94 91  BUN 50* 46* 41* 36* 29*  CREATININE 6.70* 6.90* 6.30* 5.67* 4.90*  CALCIUM 8.4* 8.7* 8.5* 8.5* 8.6*  PHOS  --   --  4.6  --   --      CBC:  Recent Labs Lab 04/05/17 1813 04/06/17 0345  WBC 7.8 7.8  HGB 9.3* 8.5*  HCT 27.4* 23.9*  MCV 92.9 93.3  PLT 224 181      Lab Results  Component Value Date   HEPBSAG Negative 04/05/2017   HEPBIGM Negative 04/05/2017       Microbiology:  Recent Results (from the past 240 hour(s))  Urine Culture     Status: None   Collection Time: 04/06/17  6:21 PM  Result Value Ref Range Status   Specimen Description URINE, RANDOM  Final   Special Requests NONE  Final   Culture   Final    NO GROWTH Performed at [facility], [address] [place], [place] [address]    Report Status 04/07/2017 FINAL  Final    Coagulation Studies:  Recent Labs  04/07/17 1140  LABPROT 14.0  INR 1.09    Urinalysis: No results for input(s): COLORURINE, LABSPEC, PHURINE, GLUCOSEU, HGBUR, BILIRUBINUR, KETONESUR, PROTEINUR, UROBILINOGEN, NITRITE, LEUKOCYTESUR in the last 72 hours.  Invalid input(s): APPERANCEUR    Imaging: Dg Bone Survey Met  Result Date: 04/08/2017 CLINICAL DATA:  Multiple myeloma. EXAM: METASTATIC BONE SURVEY COMPARISON:  Chest x-ray 04/05/2017 FINDINGS: Lateral image of the skull shows no bone lesion. There is heterogeneous density from hair braids. AP view of the chest shows no definite rib  lesion or shoulder girdle lesion. Chronic aortic tortuosity. Mild atelectasis at the right base. No edema, effusion, or pneumothorax. No evidence of cervical spine lesion. Bulky spondylotic spurring in the mid and lower cervical spine. Diffuse cervical facet arthropathy with spurring. Bilateral upper extremities showed no focal bone lesion. There is an osteopenic appearance which accounts for trabecular prominence diffusely and symmetrically in the humerus. No bony pelvis or femoral lesion is seen. There is osteopenia and atherosclerosis. No tibia or fibula lesion is noted. No visible spinal lesion. There are hypoplastic T12 ribs. There is an L2 right lamina lesion by CTA 04/05/2017. Prominent lumbar facet arthropathy with L4-5 grade 1 anterolisthesis. IMPRESSION: 1. No visible myeloma on this bone survey. In retrospect, there is a L2 right lamina lesion on 04/05/2017 abdominal CT. 2. Mild right base atelectasis, new  from 3 days ago. 3. Osteopenia. Electronically Signed   By: Monte Fantasia M.D.   On: 04/08/2017 10:11     Medications:   . sodium chloride 100 mL/hr at 04/09/17 0316  . [START ON 04/11/2017] sodium chloride     . amLODipine  10 mg Oral Daily  . dexamethasone  40 mg Oral Daily  . docusate sodium  100 mg Oral BID  . feeding supplement (ENSURE ENLIVE)  237 mL Oral BID BM  . heparin subcutaneous  5,000 Units Subcutaneous Q8H  . pantoprazole  40 mg Oral BID  . pneumococcal 23 valent vaccine  0.5 mL Intramuscular Tomorrow-1000  . temazepam  7.5 mg Oral QHS  . vitamin B-12  100 mcg Oral Daily   acetaminophen **OR** acetaminophen, alum & mag hydroxide-simeth, hydrALAZINE, hydrALAZINE, ondansetron **OR** ondansetron (ZOFRAN) IV, polyethylene glycol  Assessment/ Plan:  66 y.o. african American female with no known medical problems , who was admitted to [facility] on 04/05/2017 for evaluation of acute kidney injury.  Acute Renal Failure with Proteinuria/Hematuria - No baseline creatinine available - Acute kidney injury is likely secondary to paraproteinemia. - Started on chemotherapy with Velcade and dexamethasone. Bone marrow biopsy planned for Monday.  Hypertension -  avoid ace inhibitor at this time due to ARF  Abnormal TSH Suggesting hyperthyroidism.  Will need endocrine evaluation.     LOS: 4 [facility] 9/15/201812:14 PM  [facility] [place], [place]

## 2017-04-09 NOTE — Progress Notes (Signed)
Brandi [name]   DOB:1951-02-22   OE#:[id number]    Subjective: Patient states that she feels slightly better than since admission. However continues to poor appetite. No nausea no vomiting. She has been walking the bathroom- she has been "urinating a lot." Otherwise denies any chest pain. Mild cough. No swelling in the legs. Mild chronic back pain.  Objective:  Vitals:   04/08/17 1947 04/09/17 0417  BP: (!) 144/70 136/65  Pulse: 70 62  Resp: 18 18  Temp: 98.3 F (36.8 C) 98.5 F (36.9 C)  SpO2: 99% 98%     Intake/Output Summary (Last 24 hours) at 04/09/17 1141 Last data filed at 04/09/17 1012  Gross per 24 hour  Intake             1463 ml  Output                0 ml  Net             1463 ml    GENERAL: Well-nourished well-developed; Alert, no distress and comfortable. Accompanied by family.  EYES: no pallor or icterus OROPHARYNX: no thrush or ulceration. NECK: supple, no masses felt LYMPH:  no palpable lymphadenopathy in the cervical, axillary or inguinal regions LUNGS: decreased breath sounds to auscultation at bases and  No wheeze or crackles HEART/CVS: regular rate & rhythm and no murmurs; No lower extremity edema ABDOMEN: abdomen soft, non-tender and normal bowel sounds Musculoskeletal:no cyanosis of digits and no clubbing  PSYCH: alert & oriented x 3 with fluent speech NEURO: no focal motor/sensory deficits SKIN:  no rashes or significant lesions   Labs:  Lab Results  Component Value Date   WBC 7.8 04/06/2017   HGB 8.5 (L) 04/06/2017   HCT 23.9 (L) 04/06/2017   MCV 93.3 04/06/2017   PLT 181 04/06/2017    Lab Results  Component Value Date   NA 142 04/09/2017   K 3.7 04/09/2017   CL 112 (H) 04/09/2017   CO2 22 04/09/2017    Studies:  Dg Bone Survey Met  Result Date: 04/08/2017 CLINICAL DATA:  Multiple myeloma. EXAM: METASTATIC BONE SURVEY COMPARISON:  Chest x-ray 04/05/2017 FINDINGS: Lateral image of the skull shows no bone lesion. There is heterogeneous  density from hair braids. AP view of the chest shows no definite rib lesion or shoulder girdle lesion. Chronic aortic tortuosity. Mild atelectasis at the right base. No edema, effusion, or pneumothorax. No evidence of cervical spine lesion. Bulky spondylotic spurring in the mid and lower cervical spine. Diffuse cervical facet arthropathy with spurring. Bilateral upper extremities showed no focal bone lesion. There is an osteopenic appearance which accounts for trabecular prominence diffusely and symmetrically in the humerus. No bony pelvis or femoral lesion is seen. There is osteopenia and atherosclerosis. No tibia or fibula lesion is noted. No visible spinal lesion. There are hypoplastic T12 ribs. There is an L2 right lamina lesion by CTA 04/05/2017. Prominent lumbar facet arthropathy with L4-5 grade 1 anterolisthesis. IMPRESSION: 1. No visible myeloma on this bone survey. In retrospect, there is a L2 right lamina lesion on 04/05/2017 abdominal CT. 2. Mild right base atelectasis, new from 3 days ago. 3. Osteopenia. Electronically Signed   By: Monte Fantasia M.D.   On: 04/08/2017 10:11    Assessment & Plan:   66 year old patient currently admitted to the hospital for acute renal failure-  # Plasma cell dyscrasias- highly suggestive of multiple myeloma given the elevated M protein/abnormal light chain ratio. Symptomatic myeloma. Question amyloidosis. Plan  bone marrow biopsy ASAP.   # Acute renal failure-nonoliguric- secondary to above Creatinine improving- 4.5 today. currently on pulse dose of dexamethasone 40 mg daily for 4 days [started on 9/14]. Also s/p Velcade subcutaneous x1 dose on 9/14. Tolerated the treatments well. Will plan transition treatments to out patient basis once acute issues resolve.   # Discussed with Dr. Tressia Miners; Dr.Singh.   [name], MD 04/09/2017  11:41 AM

## 2017-04-09 NOTE — Progress Notes (Signed)
[name] at [place] NAME: Brandi [name]    MR#:  [id number]  DATE OF BIRTH:  [birth year]  SUBJECTIVE:  CHIEF COMPLAINT:   Chief Complaint  Patient presents with  . Abdominal Pain  . Shortness of Breath   - feels better. Overwhelmed with the diagnosis yesterday. -Received first dose of chemotherapy for possible myeloma yesterday. Bone marrow biopsy scheduled for Monday. -Complains of difficulty sleeping at nighttime  REVIEW OF SYSTEMS:  Review of Systems  Constitutional: Positive for malaise/fatigue. Negative for chills and fever.  HENT: Negative for congestion, ear discharge, hearing loss and nosebleeds.   Eyes: Negative for blurred vision and double vision.  Respiratory: Negative for cough, shortness of breath and wheezing.   Cardiovascular: Negative for chest pain and palpitations.  Gastrointestinal: Negative for abdominal pain, constipation, diarrhea, nausea and vomiting.  Genitourinary: Negative for dysuria and frequency.  Musculoskeletal: Negative for myalgias.  Neurological: Negative for dizziness, speech change, focal weakness, seizures and headaches.  Psychiatric/Behavioral: Negative for depression. The patient has insomnia.     DRUG ALLERGIES:  No Known Allergies  VITALS:  Blood pressure 136/65, pulse 62, temperature 98.5 F (36.9 C), temperature source Oral, resp. rate 18, height 5' 2" (1.575 m), weight 64.7 kg (142 lb 11.2 oz), SpO2 98 %.  PHYSICAL EXAMINATION:  Physical Exam  GENERAL:  66 y.o.-year-old patient lying in the bed with no acute distress.  EYES: Pupils equal, round, reactive to light and accommodation. No scleral icterus. Extraocular muscles intact.  HEENT: Head atraumatic, normocephalic. Oropharynx and nasopharynx clear.  NECK:  Supple, no jugular venous distention. No thyroid enlargement, no tenderness.  LUNGS: Normal breath sounds bilaterally, no wheezing, rales,rhonchi or crepitation. No use of  accessory muscles of respiration.  CARDIOVASCULAR: S1, S2 normal. No murmurs, rubs, or gallops.  ABDOMEN: Soft, nontender, nondistended. Bowel sounds present. No organomegaly or mass.  EXTREMITIES: No pedal edema, cyanosis, or clubbing.  NEUROLOGIC: Cranial nerves II through XII are intact. Muscle strength 5/5 in all extremities. Sensation intact. Gait not checked.  PSYCHIATRIC: The patient is alert and oriented x 3.  SKIN: No obvious rash, lesion, or ulcer.     LABORATORY PANEL:   CBC  Recent Labs Lab 04/06/17 0345  WBC 7.8  HGB 8.5*  HCT 23.9*  PLT 181   ------------------------------------------------------------------------------------------------------------------  Chemistries   Recent Labs Lab 04/05/17 1813  04/09/17 0445  NA 138  < > 142  K 4.0  < > 3.7  CL 102  < > 112*  CO2 21*  < > 22  GLUCOSE 107*  < > 91  BUN 55*  < > 29*  CREATININE 7.24*  < > 4.90*  CALCIUM 9.5  < > 8.6*  AST 22  --   --   ALT 9*  --   --   ALKPHOS 48  --   --   BILITOT 0.6  --   --   < > = values in this interval not displayed. ------------------------------------------------------------------------------------------------------------------  Cardiac Enzymes No results for input(s): TROPONINI in the last 168 hours. ------------------------------------------------------------------------------------------------------------------  RADIOLOGY:  Dg Bone Survey Met  Result Date: 04/08/2017 CLINICAL DATA:  Multiple myeloma. EXAM: METASTATIC BONE SURVEY COMPARISON:  Chest x-ray 04/05/2017 FINDINGS: Lateral image of the skull shows no bone lesion. There is heterogeneous density from hair braids. AP view of the chest shows no definite rib lesion or shoulder girdle lesion. Chronic aortic tortuosity. Mild atelectasis at the right base. No edema, effusion, or pneumothorax.  No evidence of cervical spine lesion. Bulky spondylotic spurring in the mid and lower cervical spine. Diffuse cervical facet  arthropathy with spurring. Bilateral upper extremities showed no focal bone lesion. There is an osteopenic appearance which accounts for trabecular prominence diffusely and symmetrically in the humerus. No bony pelvis or femoral lesion is seen. There is osteopenia and atherosclerosis. No tibia or fibula lesion is noted. No visible spinal lesion. There are hypoplastic T12 ribs. There is an L2 right lamina lesion by CTA 04/05/2017. Prominent lumbar facet arthropathy with L4-5 grade 1 anterolisthesis. IMPRESSION: 1. No visible myeloma on this bone survey. In retrospect, there is a L2 right lamina lesion on 04/05/2017 abdominal CT. 2. Mild right base atelectasis, new from 3 days ago. 3. Osteopenia. Electronically Signed   By: Jonathon  Watts M.D.   On: 04/08/2017 10:11    EKG:   Orders placed or performed during the hospital encounter of 04/05/17  . ED EKG  . ED EKG    ASSESSMENT AND PLAN:   Brandi [name]  is a 66 y.o. female with no significant past medical history presents to the hospital secondary to worsening back pain, myalgias and nausea.  #1 acute renal failure- concern for myeloma kidney at this time.  - also has undiagnosed hypertension. - appreciate nephrology consult. Currently receiving IV fluids and improving renal function. -Serological workup showing elevated M spike with increased free light chains. -Renal biopsy canceled as patient Might need a bone marrow biopsy at this time.cheduled for Monday. -No acute indication for dialysis-improving renal function at this time  #2 elevated M spike- questionable underlying myeloma. Hematology consult appreciated. -bone marrow biopsy scheduled for Monday. Received first dose of chemotherapy with Velcade, Decadron -Skeletal survey ordered and it is negative  #3 anemia-likely anemia of chronic disease.  - normal iron studies  #4 hypertension- on Norvasc, also IV hydralazine as needed  #5 DVT prophylaxis-on subcutaneous heparin-hold  on Monday for bone marrow biopsy  #6 tobacco use disorder-strongly counseled against smoking     All the records are reviewed and case discussed with Care Management/Social Workerr. Management plans discussed with the patient, family and they are in agreement.  CODE STATUS: full code  TOTAL TIME TAKING CARE OF THIS PATIENT: 28 minutes.   POSSIBLE D/C IN 2-3 DAYS, DEPENDING ON CLINICAL CONDITION.   KALISETTI,RADHIKA M.D on 04/09/2017 at 11:24 AM  Between 7am to 6pm - Pager - [phone]  After 6pm go to [email or web address] - password [email or web address]  [facility]  Office  [phone]  CC: Primary care physician; Center, [facility] 

## 2017-04-10 DIAGNOSIS — R0609 Other forms of dyspnea: Secondary | ICD-10-CM

## 2017-04-10 LAB — BASIC METABOLIC PANEL
ANION GAP: 11 (ref 5–15)
BUN: 30 mg/dL — ABNORMAL HIGH (ref 6–20)
CALCIUM: 8.4 mg/dL — AB (ref 8.9–10.3)
CO2: 21 mmol/L — AB (ref 22–32)
Chloride: 108 mmol/L (ref 101–111)
Creatinine, Ser: 3.75 mg/dL — ABNORMAL HIGH (ref 0.44–1.00)
GFR, EST AFRICAN AMERICAN: 14 mL/min — AB (ref >60)
GFR, EST NON AFRICAN AMERICAN: 12 mL/min — AB (ref >60)
Glucose, Bld: 102 mg/dL — ABNORMAL HIGH (ref 65–99)
Potassium: 4 mmol/L (ref 3.5–5.1)
Sodium: 140 mmol/L (ref 135–145)

## 2017-04-10 LAB — CBC
HEMATOCRIT: 22.7 % — AB (ref 35.0–47.0)
Hemoglobin: 7.9 g/dL — ABNORMAL LOW (ref 12.0–16.0)
MCH: 31.9 pg (ref 26.0–34.0)
MCHC: 34.7 g/dL (ref 32.0–36.0)
MCV: 91.9 fL (ref 80.0–100.0)
PLATELETS: 205 10*3/uL (ref 150–440)
RBC: 2.47 MIL/uL — ABNORMAL LOW (ref 3.80–5.20)
RDW: 15.2 % — AB (ref 11.5–14.5)
WBC: 8.4 10*3/uL (ref 3.6–11.0)

## 2017-04-10 LAB — PREPARE RBC (CROSSMATCH)

## 2017-04-10 LAB — PROTIME-INR
INR: 1.11
Prothrombin Time: 14.2 seconds (ref 11.4–15.2)

## 2017-04-10 LAB — ABO/RH: ABO/RH(D): O POS

## 2017-04-10 LAB — BETA 2 MICROGLOBULIN, SERUM: Beta-2 Microglobulin: 5.5 mg/L — ABNORMAL HIGH (ref 0.6–2.4)

## 2017-04-10 MED ORDER — DIPHENHYDRAMINE HCL 25 MG PO CAPS
25.0000 mg | ORAL_CAPSULE | Freq: Once | ORAL | Status: AC
  Administered 2017-04-10: 25 mg via ORAL
  Filled 2017-04-10: qty 1

## 2017-04-10 MED ORDER — SODIUM CHLORIDE 0.9% FLUSH: 3.0000 mL | INTRAVENOUS | Status: DC | PRN

## 2017-04-10 MED ORDER — SODIUM CHLORIDE 0.9 % IV SOLN
250.0000 mL | Freq: Once | INTRAVENOUS | Status: AC
Start: 2017-04-10 — End: 2017-04-10
  Administered 2017-04-10: 250 mL via INTRAVENOUS

## 2017-04-10 MED ORDER — HEPARIN SOD (PORK) LOCK FLUSH 100 UNIT/ML IV SOLN: 500.0000 [IU] | Freq: Every day | INTRAVENOUS | Status: DC | PRN

## 2017-04-10 MED ORDER — SODIUM CHLORIDE 0.9% FLUSH: 10.0000 mL | INTRAVENOUS | Status: DC | PRN

## 2017-04-10 MED ORDER — HEPARIN SOD (PORK) LOCK FLUSH 100 UNIT/ML IV SOLN: 250.0000 [IU] | INTRAVENOUS | Status: DC | PRN

## 2017-04-10 MED ORDER — ACETAMINOPHEN 325 MG PO TABS
650.0000 mg | ORAL_TABLET | Freq: Once | ORAL | Status: AC
  Administered 2017-04-10: 14:00:00 650 mg via ORAL
  Filled 2017-04-10: qty 2

## 2017-04-10 NOTE — Progress Notes (Signed)
Brandi [name]   DOB:[birth year]   KY#:[id number]    Subjective: Patient states that she feels slightly better than since admission. Denies any difficulty sleeping. Mild shortness of breath while walking.   Objective:  Vitals:   04/09/17 2014 04/10/17 0412  BP: (!) 156/84 139/68  Pulse: 66 68  Resp: 20 17  Temp: 98 F (36.7 C) 97.9 F (36.6 C)  SpO2: 98% 100%     Intake/Output Summary (Last 24 hours) at 04/10/17 1127 Last data filed at 04/09/17 2205  Gross per 24 hour  Intake             1680 ml  Output                0 ml  Net             1680 ml    GENERAL: Well-nourished well-developed; Alert, no distress and comfortable. Accompanied by family.  EYES: no pallor or icterus OROPHARYNX: no thrush or ulceration. NECK: supple, no masses felt LYMPH:  no palpable lymphadenopathy in the cervical, axillary or inguinal regions LUNGS: decreased breath sounds to auscultation at bases and  No wheeze or crackles HEART/CVS: regular rate & rhythm and no murmurs; No lower extremity edema ABDOMEN: abdomen soft, non-tender and normal bowel sounds Musculoskeletal:no cyanosis of digits and no clubbing  PSYCH: alert & oriented x 3 with fluent speech NEURO: no focal motor/sensory deficits SKIN:  no rashes or significant lesions   Labs:  Lab Results  Component Value Date   WBC 8.4 04/10/2017   HGB 7.9 (L) 04/10/2017   HCT 22.7 (L) 04/10/2017   MCV 91.9 04/10/2017   PLT 205 04/10/2017    Lab Results  Component Value Date   NA 140 04/10/2017   K 4.0 04/10/2017   CL 108 04/10/2017   CO2 21 (L) 04/10/2017    Studies:  No results found.  Assessment & Plan:   66 year old patient currently admitted to the hospital for acute renal failure-  # Plasma cell dyscrasias- highly suggestive of multiple myeloma given the elevated M protein/abnormal light chain ratio. Symptomatic myeloma. Question amyloidosis. Plan bone marrow biopsy tomorrow. Will order immunofixation.   # Acute renal  failure-nonoliguric- secondary to above Creatinine improving- 3.75 today. Currently on pulse dose of dexamethasone 40 mg daily for 4 days [started on 9/14]. Also s/p Velcade subcutaneous x1 dose on 9/14. Tolerated the treatments well. Will plan transition treatments to out patient basis once acute issues resolve.   # Worsening anemia- recommend PRBC transfusion; discussed with pt pros & Cons- agrees to transfusion. Will order 1 unit today.   # Discussed with  Dr.Singh. Pt could be potentially be discharged after her bone marrow biopsy/ with out patient follow up.   [name], MD 04/10/2017  11:27 AM

## 2017-04-10 NOTE — Progress Notes (Addendum)
[facility] NAME: Brandi [name]    MR#:  [id number]  DATE OF BIRTH:  1951/02/17  SUBJECTIVE:  CHIEF COMPLAINT:   Chief Complaint  Patient presents with  . Abdominal Pain  . Shortness of Breath   -feels much better, renal function is improving. -Sleeping better with Restoril. -For bone marrow biopsy tomorrow  REVIEW OF SYSTEMS:  Review of Systems  Constitutional: Positive for malaise/fatigue. Negative for chills and fever.  HENT: Negative for congestion, ear discharge, hearing loss and nosebleeds.   Eyes: Negative for blurred vision and double vision.  Respiratory: Negative for cough, shortness of breath and wheezing.   Cardiovascular: Negative for chest pain and palpitations.  Gastrointestinal: Negative for abdominal pain, constipation, diarrhea, nausea and vomiting.  Genitourinary: Negative for dysuria and frequency.  Musculoskeletal: Negative for myalgias.  Neurological: Negative for dizziness, speech change, focal weakness, seizures and headaches.  Psychiatric/Behavioral: Negative for depression. The patient has insomnia.     DRUG ALLERGIES:  No Known Allergies  VITALS:  Blood pressure 139/68, pulse 68, temperature 97.9 F (36.6 C), temperature source Oral, resp. rate 17, height _0  (1.575 m), weight 64.7 kg (142 lb 11.2 oz), SpO2 100 %.  PHYSICAL EXAMINATION:  Physical Exam  GENERAL:  66 y.o.-year-old patient lying in the bed with no acute distress.  EYES: Pupils equal, round, reactive to light and accommodation. No scleral icterus. Extraocular muscles intact.  HEENT: Head atraumatic, normocephalic. Oropharynx and nasopharynx clear.  NECK:  Supple, no jugular venous distention. No thyroid enlargement, no tenderness.  LUNGS: Normal breath sounds bilaterally, no wheezing, rales,rhonchi or crepitation. No use of accessory muscles of respiration.  CARDIOVASCULAR: S1, S2 normal. No murmurs, rubs, or gallops.    ABDOMEN: Soft, nontender, nondistended. Bowel sounds present. No organomegaly or mass.  EXTREMITIES: No pedal edema, cyanosis, or clubbing.  NEUROLOGIC: Cranial nerves II through XII are intact. Muscle strength 5/5 in all extremities. Sensation intact. Gait not checked.  PSYCHIATRIC: The patient is alert and oriented x 3.  SKIN: No obvious rash, lesion, or ulcer.     LABORATORY PANEL:   CBC  Recent Labs Lab 04/10/17 0623  WBC 8.4  HGB 7.9*  HCT 22.7*  PLT 205   ------------------------------------------------------------------------------------------------------------------  Chemistries   Recent Labs Lab 04/05/17 1813  04/10/17 0623  NA 138  < > 140  K 4.0  < > 4.0  CL 102  < > 108  CO2 21*  < > 21*  GLUCOSE 107*  < > 102*  BUN 55*  < > 30*  CREATININE 7.24*  < > 3.75*  CALCIUM 9.5  < > 8.4*  AST 22  --   --   ALT 9*  --   --   ALKPHOS 48  --   --   BILITOT 0.6  --   --   < > = values in this interval not displayed. ------------------------------------------------------------------------------------------------------------------  Cardiac Enzymes No results for input(s): TROPONINI in the last 168 hours. ------------------------------------------------------------------------------------------------------------------  RADIOLOGY:  Dg Bone Survey Met  Result Date: 04/08/2017 CLINICAL DATA:  Multiple myeloma. EXAM: METASTATIC BONE SURVEY COMPARISON:  Chest x-ray 04/05/2017 FINDINGS: Lateral image of the skull shows no bone lesion. There is heterogeneous density from hair braids. AP view of the chest shows no definite rib lesion or shoulder girdle lesion. Chronic aortic tortuosity. Mild atelectasis at the right base. No edema, effusion, or pneumothorax. No evidence of cervical spine lesion. Bulky spondylotic spurring in the mid  and lower cervical spine. Diffuse cervical facet arthropathy with spurring. Bilateral upper extremities showed no focal bone lesion. There is an  osteopenic appearance which accounts for trabecular prominence diffusely and symmetrically in the humerus. No bony pelvis or femoral lesion is seen. There is osteopenia and atherosclerosis. No tibia or fibula lesion is noted. No visible spinal lesion. There are hypoplastic T12 ribs. There is an L2 right lamina lesion by CTA 04/05/2017. Prominent lumbar facet arthropathy with L4-5 grade 1 anterolisthesis. IMPRESSION: 1. No visible myeloma on this bone survey. In retrospect, there is a L2 right lamina lesion on 04/05/2017 abdominal CT. 2. Mild right base atelectasis, new from 3 days ago. 3. Osteopenia. Electronically Signed   By: Monte Fantasia M.D.   On: 04/08/2017 10:11    EKG:   Orders placed or performed during the hospital encounter of 04/05/17  . ED EKG  . ED EKG    ASSESSMENT AND PLAN:   Brandi [name]  is a 66 y.o. female with no significant past medical history presents to the hospital secondary to worsening back pain, myalgias and nausea.  #1 acute renal failure- concern for myeloma kidney at this time.  - also has undiagnosed hypertension. - appreciate nephrology consult. Currently receiving IV fluids and improving renal function. -Serological workup showing elevated M spike with increased free light chains. -Renal biopsy canceled as patient Might need a bone marrow biopsy at this time.scheduled for Monday. -No acute indication for dialysis-improving renal function at this time  #2 elevated M spike- questionable underlying myeloma. Hematology consult appreciated. -bone marrow biopsy scheduled for Monday. Received first dose of chemotherapy with Velcade, Decadron -Skeletal survey ordered and it is negative -outpatient follow-up with hematology after discharge will be set up  #3 anemia-likely anemia of chronic disease.  - normal iron studies  #4 hypertension- on Norvasc, also IV hydralazine as needed  #5 DVT prophylaxis-on subcutaneous heparin-hold  for bone marrow biopsy  tomorrow  #6 tobacco use disorder-strongly counseled against smoking  #7 B12 deficiency-received one intramuscular dose of vitamin B12 and started on oral supplements   If renal function is improving, after bone marrow biopsy tomorrow, check with nephrology and hematology if patient can be discharged    All the records are reviewed and case discussed with Care Management/Social Workerr. Management plans discussed with the patient, family and they are in agreement.  CODE STATUS: full code  TOTAL TIME TAKING CARE OF THIS PATIENT: 28 minutes.   POSSIBLE D/C IN 1-2  DAYS, DEPENDING ON CLINICAL CONDITION.   Gladstone Lighter M.D on 04/10/2017 at 9:45 AM  Between 7am to 6pm - Pager - [phone]  After 6pm go to [email or web address] - password [email or web address] [facility]  Office  [phone]  CC: Primary care physician; Center, [facility]

## 2017-04-10 NOTE — Progress Notes (Signed)
[facility] [place], [place] 04/10/17  Subjective:   Overall patient is doing well. Denies any leg edema. Urine output is good Serum creatinine has improved to 3.75 today Started chemotherapy this admission with dexamethasone and Velcade    Objective:  Vital signs in last 24 hours:  Temp:  [97.7 F (36.5 C)-98 F (36.7 C)] 97.9 F (36.6 C) (09/16 0412) Pulse Rate:  [66-71] 68 (09/16 0412) Resp:  [17-20] 17 (09/16 0412) BP: (139-156)/(68-84) 139/68 (09/16 0412) SpO2:  [98 %-100 %] 100 % (09/16 0412)  Weight change:  Filed Weights   04/05/17 1809 04/05/17 2242  Weight: 64.9 kg (143 lb) 64.7 kg (142 lb 11.2 oz)    Intake/Output:    Intake/Output Summary (Last 24 hours) at 04/10/17 1224 Last data filed at 04/09/17 2205  Gross per 24 hour  Intake             1680 ml  Output                0 ml  Net             1680 ml     Physical Exam: General: No acute distress, sitting up in the bed  HEENT Anicteric, moist oral mucous membranes  Neck No masses, no distended neck veins  Pulm/lungs Lungs are clear to auscultation  CVS/Heart Regular rhythm no rub or gallop  Abdomen:  Soft, nontender  Extremities: No edema  Neurologic: Alert, oriented  Skin: No acute rashes          Basic Metabolic Panel:   Recent Labs Lab 04/06/17 1412 04/07/17 0533 04/08/17 0416 04/09/17 0445 04/10/17 0623  NA 138 142 143 142 140  K 3.6 4.2 3.8 3.7 4.0  CL 107 112* 113* 112* 108  CO2 19* 21* 21* 22 21*  GLUCOSE 129* 91 94 91 102*  BUN 46* 41* 36* 29* 30*  CREATININE 6.90* 6.30* 5.67* 4.90* 3.75*  CALCIUM 8.7* 8.5* 8.5* 8.6* 8.4*  PHOS  --  4.6  --   --   --      CBC:  Recent Labs Lab 04/05/17 1813 04/06/17 0345 04/10/17 0623  WBC 7.8 7.8 8.4  HGB 9.3* 8.5* 7.9*  HCT 27.4* 23.9* 22.7*  MCV 92.9 93.3 91.9  PLT 224 181 205      Lab Results  Component Value Date   HEPBSAG Negative 04/05/2017   HEPBIGM Negative 04/05/2017       Microbiology:  Recent Results (from the past 240 hour(s))  Urine Culture     Status: None   Collection Time: 04/06/17  6:21 PM  Result Value Ref Range Status   Specimen Description URINE, RANDOM  Final   Special Requests NONE  Final   Culture   Final    NO GROWTH Performed at [facility], [address], [place], [place] [address]    Report Status 04/07/2017 FINAL  Final    Coagulation Studies:  Recent Labs  04/10/17 0623  LABPROT 14.2  INR 1.11    Urinalysis: No results for input(s): COLORURINE, LABSPEC, PHURINE, GLUCOSEU, HGBUR, BILIRUBINUR, KETONESUR, PROTEINUR, UROBILINOGEN, NITRITE, LEUKOCYTESUR in the last 72 hours.  Invalid input(s): APPERANCEUR    Imaging: No results found.   Medications:   . sodium chloride 100 mL/hr at 04/10/17 0723  . [START ON 04/11/2017] sodium chloride    . sodium chloride     . acetaminophen  650 mg Oral Once  . amLODipine  10 mg Oral Daily  .  dexamethasone  40 mg Oral Daily  . diphenhydrAMINE  25 mg Oral Once  . docusate sodium  100 mg Oral BID  . feeding supplement (ENSURE ENLIVE)  237 mL Oral BID BM  . pantoprazole  40 mg Oral BID  . pneumococcal 23 valent vaccine  0.5 mL Intramuscular Tomorrow-1000  . temazepam  7.5 mg Oral QHS  . vitamin B-12  100 mcg Oral Daily   acetaminophen **OR** acetaminophen, alum & mag hydroxide-simeth, heparin lock flush, heparin lock flush, hydrALAZINE, hydrALAZINE, ondansetron **OR** ondansetron (ZOFRAN) IV, polyethylene glycol, sodium chloride flush, sodium chloride flush  Assessment/ Plan:  66 y.o. african American female with no known medical problems , who was admitted to [facility] on 04/05/2017 for evaluation of acute kidney injury.  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 - Started on chemotherapy with Velcade and dexamethasone. Bone marrow biopsy  planned for Monday. - Follow-up outpatient  Hypertension -  avoid ace inhibitor at this time due to ARF  Abnormal TSH Suggesting hyperthyroidism.  Will need endocrine evaluation.     LOS: 5 [facility] 9/16/201812:24 PM  [facility] [place], [place]

## 2017-04-11 ENCOUNTER — Telehealth: Payer: Self-pay | Admitting: Internal Medicine

## 2017-04-11 ENCOUNTER — Inpatient Hospital Stay: Payer: Medicare Other

## 2017-04-11 ENCOUNTER — Other Ambulatory Visit (HOSPITAL_COMMUNITY)
Admission: RE | Admit: 2017-04-11 | Discharge: 2017-04-11 | Disposition: A | Discharge status: Home / Self Care | Payer: Medicare Other | Source: Ambulatory Visit | Attending: Internal Medicine | Admitting: Internal Medicine

## 2017-04-11 DIAGNOSIS — D649 Anemia, unspecified: Secondary | ICD-10-CM | POA: Diagnosis not present

## 2017-04-11 DIAGNOSIS — D4989 Neoplasm of unspecified behavior of other specified sites: Secondary | ICD-10-CM | POA: Diagnosis not present

## 2017-04-11 LAB — BASIC METABOLIC PANEL
ANION GAP: 10 (ref 5–15)
BUN: 35 mg/dL — AB (ref 6–20)
CO2: 22 mmol/L (ref 22–32)
Calcium: 8.1 mg/dL — ABNORMAL LOW (ref 8.9–10.3)
Chloride: 108 mmol/L (ref 101–111)
Creatinine, Ser: 2.97 mg/dL — ABNORMAL HIGH (ref 0.44–1.00)
GFR calc Af Amer: 18 mL/min — ABNORMAL LOW (ref >60)
GFR, EST NON AFRICAN AMERICAN: 16 mL/min — AB (ref >60)
GLUCOSE: 99 mg/dL (ref 65–99)
Potassium: 3.9 mmol/L (ref 3.5–5.1)
Sodium: 140 mmol/L (ref 135–145)

## 2017-04-11 LAB — TYPE AND SCREEN
ABO/RH(D): O POS
Antibody Screen: NEGATIVE
Unit division: 0

## 2017-04-11 LAB — CBC WITH DIFFERENTIAL/PLATELET
Basophils Absolute: 0 10*3/uL (ref 0–0.1)
Basophils Relative: 0 %
EOS PCT: 0 %
Eosinophils Absolute: 0 10*3/uL (ref 0–0.7)
HEMATOCRIT: 23.6 % — AB (ref 35.0–47.0)
Hemoglobin: 8.3 g/dL — ABNORMAL LOW (ref 12.0–16.0)
LYMPHS ABS: 1.2 10*3/uL (ref 1.0–3.6)
LYMPHS PCT: 16 %
MCH: 32 pg (ref 26.0–34.0)
MCHC: 35.1 g/dL (ref 32.0–36.0)
MCV: 91.1 fL (ref 80.0–100.0)
MONO ABS: 0.5 10*3/uL (ref 0.2–0.9)
MONOS PCT: 6 %
NEUTROS ABS: 6.1 10*3/uL (ref 1.4–6.5)
Neutrophils Relative %: 78 %
Platelets: 189 10*3/uL (ref 150–440)
RBC: 2.59 MIL/uL — ABNORMAL LOW (ref 3.80–5.20)
RDW: 15.7 % — AB (ref 11.5–14.5)
WBC: 7.8 10*3/uL (ref 3.6–11.0)

## 2017-04-11 LAB — UPEP/TP, 24-HR URINE
ALBUMIN, U: 11 %
Alpha 1, Urine: 1.4 %
Alpha 2, Urine: 2.4 %
Beta, Urine: 3.7 %
GAMMA UR: 81.4 %
M-SPIKE, MG/24 HR U24PEL: 1075.2 mg/(24.h) — AB
M-spike, %: 75.2 % — ABNORMAL HIGH
TOTAL PROTEIN, URINE-UPE24: 81.7 mg/dL
Total Protein, Urine-Ur/day: 1430 mg/24 hr — ABNORMAL HIGH (ref 30–150)
Total Volume: 1750

## 2017-04-11 LAB — BPAM RBC
BLOOD PRODUCT EXPIRATION DATE: 201809262359
ISSUE DATE / TIME: 201809161342
Unit Type and Rh: 9500

## 2017-04-11 MED ORDER — AMLODIPINE BESYLATE 10 MG PO TABS: 10.0000 mg | ORAL_TABLET | Freq: Every day | ORAL | 0 refills | Status: DC

## 2017-04-11 MED ORDER — HEPARIN SOD (PORK) LOCK FLUSH 100 UNIT/ML IV SOLN
INTRAVENOUS | Status: AC
  Filled 2017-04-11: qty 5

## 2017-04-11 MED ORDER — ACETAMINOPHEN 325 MG PO TABS: 325.0000 mg | ORAL_TABLET | Freq: Four times a day (QID) | ORAL | Status: DC | PRN

## 2017-04-11 MED ORDER — TEMAZEPAM 7.5 MG PO CAPS: 7.5000 mg | ORAL_CAPSULE | Freq: Every day | ORAL | 0 refills | Status: DC

## 2017-04-11 MED ORDER — DEXAMETHASONE 4 MG PO TABS: 40.0000 mg | ORAL_TABLET | Freq: Every day | ORAL | 0 refills | Status: DC

## 2017-04-11 MED ORDER — FENTANYL CITRATE (PF) 100 MCG/2ML IJ SOLN
INTRAMUSCULAR | Status: AC | PRN
  Administered 2017-04-11 (×2): 25 ug via INTRAVENOUS

## 2017-04-11 MED ORDER — MIDAZOLAM HCL 5 MG/5ML IJ SOLN
INTRAMUSCULAR | Status: AC | PRN
  Administered 2017-04-11: 1 mg via INTRAVENOUS
  Administered 2017-04-11: 0.5 mg via INTRAVENOUS

## 2017-04-11 MED ORDER — POLYETHYLENE GLYCOL 3350 17 G PO PACK: 17.0000 g | PACK | Freq: Every day | ORAL | 0 refills | Status: AC | PRN

## 2017-04-11 MED ORDER — CYANOCOBALAMIN 100 MCG PO TABS: 100.0000 ug | ORAL_TABLET | Freq: Every day | ORAL | 0 refills | Status: DC

## 2017-04-11 MED ORDER — MIDAZOLAM HCL 5 MG/5ML IJ SOLN
INTRAMUSCULAR | Status: AC
  Filled 2017-04-11: qty 5

## 2017-04-11 MED ORDER — PANTOPRAZOLE SODIUM 40 MG PO TBEC: 40.0000 mg | DELAYED_RELEASE_TABLET | Freq: Every day | ORAL | Status: DC

## 2017-04-11 MED ORDER — FENTANYL CITRATE (PF) 100 MCG/2ML IJ SOLN
INTRAMUSCULAR | Status: AC
  Filled 2017-04-11: qty 2

## 2017-04-11 MED ORDER — PNEUMOCOCCAL VAC POLYVALENT 25 MCG/0.5ML IJ INJ: 0.5000 mL | INJECTION | INTRAMUSCULAR | 0 refills | Status: AC

## 2017-04-11 MED ORDER — ENSURE ENLIVE PO LIQD: 237.0000 mL | Freq: Two times a day (BID) | ORAL | 0 refills | Status: DC

## 2017-04-11 NOTE — Progress Notes (Signed)
Pt d/c. IV removed intact. Dressing clean. Left with family.

## 2017-04-11 NOTE — Progress Notes (Signed)
[facility]   DOB:02/16/1951   WU#:[id number]    Subjective: Patient had blood transfusion yesterday. Uneventful. Patient states that she feels slightly better than since admission. Denies any difficulty sleeping. She's awaiting a bone marrow biopsy this morning. She is nothing by mouth.  Objective:  Vitals:   04/11/17 1030 04/11/17 1045  BP: (!) 151/83 (!) 149/64  Pulse: (!) 47 (!) 58  Resp: 15 16  Temp:  98.2 F (36.8 C)  SpO2: 100% 100%     Intake/Output Summary (Last 24 hours) at 04/11/17 1409 Last data filed at 04/10/17 1822  Gross per 24 hour  Intake              550 ml  Output                0 ml  Net              550 ml    GENERAL: Well-nourished well-developed; Alert, no distress and comfortable.She is alone. EYES: no pallor or icterus OROPHARYNX: no thrush or ulceration. NECK: supple, no masses felt LYMPH:  no palpable lymphadenopathy in the cervical, axillary or inguinal regions LUNGS: decreased breath sounds to auscultation at bases and  No wheeze or crackles HEART/CVS: regular rate & rhythm and no murmurs; No lower extremity edema ABDOMEN: abdomen soft, non-tender and normal bowel sounds Musculoskeletal:no cyanosis of digits and no clubbing  PSYCH: alert & oriented x 3 with fluent speech NEURO: no focal motor/sensory deficits SKIN:  no rashes or significant lesions   Labs:  Lab Results  Component Value Date   WBC 7.8 04/11/2017   HGB 8.3 (L) 04/11/2017   HCT 23.6 (L) 04/11/2017   MCV 91.1 04/11/2017   PLT 189 04/11/2017   NEUTROABS 6.1 04/11/2017    Lab Results  Component Value Date   NA 140 04/11/2017   K 3.9 04/11/2017   CL 108 04/11/2017   CO2 22 04/11/2017    Studies:  Ct Biopsy  Result Date: 04/11/2017 INDICATION: 66 year old with acute renal failure and myeloma workup. EXAM: CT GUIDED BONE MARROW ASPIRATES AND BIOPSY Physician: Stephan Minister. Anselm Pancoast, MD MEDICATIONS: None. ANESTHESIA/SEDATION: Fentanyl 50 mcg IV; Versed 1.5 mg IV Moderate  Sedation Time:  29 minutes The patient was continuously monitored during the procedure by the interventional radiology nurse under my direct supervision. COMPLICATIONS: None immediate. PROCEDURE: The procedure was explained to the patient. The risks and benefits of the procedure were discussed and the patient's questions were addressed. Informed consent was obtained from the patient. The patient was placed prone on CT scan. Images of the pelvis were obtained. The right side of back was prepped and draped in sterile fashion. The skin and right posterior iliac bone were anesthetized with 1% lidocaine. 11 gauge bone needle was directed into the right iliac bone with CT guidance. Two aspirates and two core biopsies obtained. Bandage placed over the puncture site. FINDINGS: Bone needle directed into the posterior right ilium. IMPRESSION: CT guided bone marrow aspirates and core biopsy. Electronically Signed   By: Markus Daft M.D.   On: 04/11/2017 12:59    Assessment & Plan:   66 year old patient currently admitted to the hospital for acute renal failure-  # Plasma cell dyscrasias- highly suggestive of multiple myeloma given the elevated M protein/abnormal light chain ratio. Symptomatic myeloma. Question amyloidosis. Plan bone marrow biopsy tomorrow. Will order immunofixation.   # Acute renal failure-nonoliguric- secondary to above Creatinine improving- 2.45 today. Currently on pulse dose of dexamethasone 40  mg daily for 4 days [started on 9/14]. Also s/p Velcade subcutaneous x1 dose on 9/14. Tolerated the treatments well. Since significant improvement in creatinine/renal function- the plan for outpatient follow-up.   # Worsening anemia-multiple myeloma/renal insufficiency- status post wide of PRBC today hemoglobin is 8.3. Monitor for now.  # Discussed with  Dr.Singh/Gouru. Pt could be potentially be discharged after her bone marrow biopsy today/ with out-patient follow up.   [name],  MD 04/11/2017  2:09 PM

## 2017-04-11 NOTE — Progress Notes (Signed)
Patient ID: Brandi [name], female   DOB: 1950/12/26, 66 y.o.   MRN: [id number] Patient is scheduled for bone marrow biopsy. Patient has acute renal failure with monoclonal gammopathy.  Medical chart has been reviewed.  Discussed procedure with patient.  No new complaints today.  Exam:  Heart: RRR Lungs: CTA Abd:  Mild tenderness, no distention Patient is agreeable to the procedure and informed consent obtained.  Plan for CT guided bone marrow biopsy today.

## 2017-04-11 NOTE — Discharge Summary (Addendum)
[facility] [place] NAME: Brandi [name]    MR#:  [id number]  DATE OF BIRTH:  1951/03/16  DATE OF ADMISSION:  04/05/2017 ADMITTING PHYSICIAN: Gladstone Lighter, MD  DATE OF DISCHARGE:  04/11/17 PRIMARY CARE PHYSICIAN: [facility] [place]    ADMISSION DIAGNOSIS:  Generalized abdominal pain [R10.84] AKI (acute kidney injury) ([place]) [N17.9] Anemia, unspecified type [D64.9] Hypertension, unspecified type [I10]  DISCHARGE DIAGNOSIS:  Active Problems:   ARF (acute renal failure) ([place])   SECONDARY DIAGNOSIS:  History reviewed. No pertinent past medical history.  HOSPITAL COURSE:   HPI Brandi [name]  is a 66 y.o. female with no significant past medical history presents to the hospital secondary to worsening back pain, myalgias and nausea. Patient started having upper respiratory tract infection symptoms about 2-3 weeks ago, associated with some cough and myalgias. She's always had muscle cramps before. She hasn't seen a physician in several years and has not had any kind of blood work lately. She notices that she was having hesitation with starting her urinary stream, however 1 started she had good amounts up until 3 days ago. Her urine output has significantly decreased. Due to significant nausea her oral intake has decreased as well. She took 4 tablets of Advil and 2 tablets of ibuprofen couple of days ago. She came to the emergency room and was noted to have a creatinine of 7.2 and normal potassium on her blood work. Also elevated protein and anemia noted.urine protein was elevated as well and noted to have high blood pressure here.  #1 acute renal failure- concern for myeloma kidney at this time.  - also has undiagnosed hypertension. - appreciate nephrology consult. Received outpatient follow-up with nephrology as recommended IV fluids and improving renal function. -Serological workup showing elevated M spike with increased  free light chains. -Renal biopsy canceled as patient had a bone marrow biopsy today -improving renal function at this time; creatinine 4.90-3.75-2.59   #2 elevated M spike- questionable underlying myeloma. Hematology consult appreciated. -bone marrow biopsy done today - Monday. Received first dose of chemotherapy with Velcade, Decadron Patient does not need any by mouth Decadron after discharge. Discussed with oncology.called pharmacist and clarified -Skeletal survey ordered and it is negative -outpatient follow-up with hematology after discharge will be set up  #3 anemia-likely anemia of chronic disease.  - normal iron studies  #4 hypertension- on Norvasc, also IV hydralazine given as needed  #5 DVT prophylaxis-on subcutaneous heparin-hold  for bone marrow biopsy tomorrow  #6 tobacco use disorder-strongly counseled against smoking  #7 B12 deficiency-received one intramuscular dose of vitamin B12 and started on oral supplements  #8 abnormal TSH-0.071; free T4 is normal-PCP to consider repeating thyroid function tests in one month and referral to endocrinology if needed  DISCHARGE CONDITIONS:   Stable   CONSULTS OBTAINED:  Treatment Team:  [name], MD   PROCEDURES Bone marrow biopsy done today  DRUG ALLERGIES:  No Known Allergies  DISCHARGE MEDICATIONS:   Discharge Medication List as of 04/11/2017  2:51 PM    START taking these medications   Details  acetaminophen (TYLENOL) 325 MG tablet Take 1 tablet (325 mg total) by mouth every 6 (six) hours as needed for mild pain (or Fever >/= 101)., Starting Mon 04/11/2017, OTC    amLODipine (NORVASC) 10 MG tablet Take 1 tablet (10 mg total) by mouth daily., Starting Tue 04/12/2017, Print    feeding supplement, ENSURE ENLIVE, (ENSURE ENLIVE) LIQD Take 237 mLs  by mouth 2 (two) times daily between meals., Starting Tue 04/12/2017, Normal    pantoprazole (PROTONIX) 40 MG tablet Take 1 tablet (40 mg total) by mouth  daily., Starting Mon 04/11/2017, OTC    pneumococcal 23 valent vaccine (PNU-IMMUNE) 25 MCG/0.5ML injection Inject 0.5 mLs into the muscle tomorrow at 10 am., Starting Mon 04/11/2017, Normal    polyethylene glycol (MIRALAX / GLYCOLAX) packet Take 17 g by mouth daily as needed for mild constipation., Starting Mon 04/11/2017, Print    temazepam (RESTORIL) 7.5 MG capsule Take 1 capsule (7.5 mg total) by mouth at bedtime., Starting Mon 04/11/2017, Print    vitamin B-12 100 MCG tablet Take 1 tablet (100 mcg total) by mouth daily., Starting Tue 04/12/2017, Print    dexamethasone (DECADRON) 4 MG tablet Take 10 tablets (40 mg total) by mouth daily., Starting Tue 04/12/2017, Print         DISCHARGE INSTRUCTIONS:   Follow-up with primary care physician in a week   follow-up with nephrology Dr. Holley Raring in a week Follow-up with oncology Dr. Seward Meth in 1 week  DIET:  Cardiac diet  DISCHARGE CONDITION:  Fair  ACTIVITY:  Activity as tolerated  OXYGEN:  Home Oxygen: No.   Oxygen Delivery: room air  DISCHARGE LOCATION:  home   If you experience worsening of your admission symptoms, develop shortness of breath, life threatening emergency, suicidal or homicidal thoughts you must seek medical attention immediately by calling 911 or calling your MD immediately  if symptoms less severe.  You Must read complete instructions/literature along with all the possible adverse reactions/side effects for all the Medicines you take and that have been prescribed to you. Take any new Medicines after you have completely understood and accpet all the possible adverse reactions/side effects.   Please note  You were cared for by a hospitalist during your hospital stay. If you have any questions about your discharge medications or the care you received while you were in the hospital after you are discharged, you can call the unit and asked to speak with the hospitalist on call if the hospitalist that took care of  you is not available. Once you are discharged, your primary care physician will handle any further medical issues. Please note that NO REFILLS for any discharge medications will be authorized once you are discharged, as it is imperative that you return to your primary care physician (or establish a relationship with a primary care physician if you do not have one) for your aftercare needs so that they can reassess your need for medications and monitor your lab values.     Today  Chief Complaint  Patient presents with  . Abdominal Pain  . Shortness of Breath   Patient is resting comfortably. Denies any complaints. Had a bone marrow biopsy today and feels comfortable to go home.  ROS:  CONSTITUTIONAL: Denies fevers, chills. Denies any fatigue, weakness.  EYES: Denies blurry vision, double vision, eye pain. EARS, NOSE, THROAT: Denies tinnitus, ear pain, hearing loss. RESPIRATORY: Denies cough, wheeze, shortness of breath.  CARDIOVASCULAR: Denies chest pain, palpitations, edema.  GASTROINTESTINAL: Denies nausea, vomiting, diarrhea, abdominal pain. Denies bright red blood per rectum. GENITOURINARY: Denies dysuria, hematuria. ENDOCRINE: Denies nocturia or thyroid problems. HEMATOLOGIC AND LYMPHATIC: Denies easy bruising or bleeding. SKIN: Denies rash or lesion. MUSCULOSKELETAL: Denies pain in neck, back, shoulder, knees, hips or arthritic symptoms.  NEUROLOGIC: Denies paralysis, paresthesias.  PSYCHIATRIC: Denies anxiety or depressive symptoms.   VITAL SIGNS:  Blood pressure (!) 157/82, pulse 62,  temperature 97.8 F (36.6 C), resp. rate 18, height '5\' 2"'$  (1.575 m), weight 64.7 kg (142 lb 11.2 oz), SpO2 98 %.  I/O:   No intake or output data in the 24 hours ending 04/12/17 1332  PHYSICAL EXAMINATION:  GENERAL:  66 y.o.-year-old patient lying in the bed with no acute distress.  EYES: Pupils equal, round, reactive to light and accommodation. No scleral icterus. Extraocular muscles  intact.  HEENT: Head atraumatic, normocephalic. Oropharynx and nasopharynx clear.  NECK:  Supple, no jugular venous distention. No thyroid enlargement, no tenderness.  LUNGS: Normal breath sounds bilaterally, no wheezing, rales,rhonchi or crepitation. No use of accessory muscles of respiration.  CARDIOVASCULAR: S1, S2 normal. No murmurs, rubs, or gallops.  ABDOMEN: Soft, non-tender, non-distended. Bowel sounds present. No organomegaly or mass.  EXTREMITIES: No pedal edema, cyanosis, or clubbing.  NEUROLOGIC: Cranial nerves II through XII are intact. Muscle strength 5/5 in all extremities. Sensation intact. Gait not checked.  PSYCHIATRIC: The patient is alert and oriented x 3.  SKIN: No obvious rash, lesion, or ulcer.   DATA REVIEW:   CBC  Recent Labs Lab 04/11/17 0521  WBC 7.8  HGB 8.3*  HCT 23.6*  PLT 189    Chemistries   Recent Labs Lab 04/05/17 1813  04/11/17 0521  NA 138  < > 140  K 4.0  < > 3.9  CL 102  < > 108  CO2 21*  < > 22  GLUCOSE 107*  < > 99  BUN 55*  < > 35*  CREATININE 7.24*  < > 2.97*  CALCIUM 9.5  < > 8.1*  AST 22  --   --   ALT 9*  --   --   ALKPHOS 48  --   --   BILITOT 0.6  --   --   < > = values in this interval not displayed.  Cardiac Enzymes No results for input(s): TROPONINI in the last 168 hours.  Microbiology Results  Results for orders placed or performed during the hospital encounter of 04/05/17  Urine Culture     Status: None   Collection Time: 04/06/17  6:21 PM  Result Value Ref Range Status   Specimen Description URINE, RANDOM  Final   Special Requests NONE  Final   Culture   Final    NO GROWTH Performed at [facility], [address], [place], [place] [address]    Report Status 04/07/2017 FINAL  Final    RADIOLOGY:  Ct Biopsy  Result Date: 04/11/2017 INDICATION: 66 year old with acute renal failure and myeloma workup. EXAM: CT GUIDED BONE MARROW ASPIRATES AND BIOPSY Physician: Stephan Minister. Anselm Pancoast, MD MEDICATIONS: None.  ANESTHESIA/SEDATION: Fentanyl 50 mcg IV; Versed 1.5 mg IV Moderate Sedation Time:  29 minutes The patient was continuously monitored during the procedure by the interventional radiology nurse under my direct supervision. COMPLICATIONS: None immediate. PROCEDURE: The procedure was explained to the patient. The risks and benefits of the procedure were discussed and the patient's questions were addressed. Informed consent was obtained from the patient. The patient was placed prone on CT scan. Images of the pelvis were obtained. The right side of back was prepped and draped in sterile fashion. The skin and right posterior iliac bone were anesthetized with 1% lidocaine. 11 gauge bone needle was directed into the right iliac bone with CT guidance. Two aspirates and two core biopsies obtained. Bandage placed over the puncture site. FINDINGS: Bone needle directed into the posterior right ilium. IMPRESSION: CT guided  bone marrow aspirates and core biopsy. Electronically Signed   By: Markus Daft M.D.   On: 04/11/2017 12:59    EKG:   Orders placed or performed during the hospital encounter of 04/05/17  . ED EKG  . ED EKG      Management plans discussed with the patient, family and they are in agreement.  CODE STATUS:     Code Status Orders        Start     Ordered   04/05/17 2236  Full code  Continuous     04/05/17 2235    Code Status History    Date Active Date Inactive Code Status Order ID Comments User Context   This patient has a current code status but no historical code status.      TOTAL TIME TAKING CARE OF THIS PATIENT: 43  minutes.   Note: This dictation was prepared with Dragon dictation along with smaller phrase technology. Any transcriptional errors that result from this process are unintentional.   _0 @  on 04/12/2017 at 1:32 PM  Between 7am to 6pm - Pager - [phone]  After 6pm go to [email or web address] - password [email or web address] [facility]  [facility]  Office   [phone]  CC: Primary care physician; [facility], [facility]

## 2017-04-11 NOTE — Progress Notes (Signed)
Pt returned from procedure. VSS. No drainage on dressing. No complaints of pain.

## 2017-04-11 NOTE — Care Management Important Message (Signed)
Important Message  Patient Details  Name: Brandi [name] MRN: [id number] Date of Birth: [birth year]   Medicare Important Message Given:  Yes    Shelbie Ammons, RN 04/11/2017, 8:49 AM

## 2017-04-11 NOTE — Telephone Encounter (Signed)
msg sent to sch. team 

## 2017-04-11 NOTE — Telephone Encounter (Signed)
Please Set up for velcade on 7/18; 7/21-velacade labs; MD; possible blood. Thx

## 2017-04-11 NOTE — Procedures (Signed)
CT guided bone marrow biopsy.  2 aspirates and 2 cores obtained. Minimal blood loss and no immediate complication.

## 2017-04-11 NOTE — Progress Notes (Signed)
Pt down for procedure with IV pole. VSS. NPO before procedure.

## 2017-04-11 NOTE — Progress Notes (Signed)
[facility], [place] 04/11/17  Subjective:  Renal function continues to improve. Creatinine down to 2.97.  patient just underwent bone marrow biopsy and appears to have tolerated this quite well.   Objective:  Vital signs in last 24 hours:  Temp:  [97.2 F (36.2 C)-98.4 F (36.9 C)] 98.2 F (36.8 C) (09/17 1045) Pulse Rate:  [46-67] 58 (09/17 1045) Resp:  [12-22] 16 (09/17 1045) BP: (130-185)/(64-92) 149/64 (09/17 1045) SpO2:  [98 %-100 %] 100 % (09/17 1045)  Weight change:  Filed Weights   04/05/17 1809 04/05/17 2242  Weight: 64.9 kg (143 lb) 64.7 kg (142 lb 11.2 oz)    Intake/Output:    Intake/Output Summary (Last 24 hours) at 04/11/17 1121 Last data filed at 04/10/17 1822  Gross per 24 hour  Intake              790 ml  Output                0 ml  Net              790 ml     Physical Exam: General: No acute distress  HEENT Anicteric, moist oral mucous membranes  Neck supple  Pulm/lungs Lungs are clear to auscultation  CVS/Heart Regular rhythm no rub or gallop  Abdomen:  Soft, nontender  Extremities: No edema  Neurologic: Alert, oriented x 3, conversant  Skin: No acute rashes          Basic Metabolic Panel:   Recent Labs Lab 04/07/17 0533 04/08/17 0416 04/09/17 0445 04/10/17 0623 04/11/17 0521  NA 142 143 142 140 140  K 4.2 3.8 3.7 4.0 3.9  CL 112* 113* 112* 108 108  CO2 21* 21* 22 21* 22  GLUCOSE 91 94 91 102* 99  BUN 41* 36* 29* 30* 35*  CREATININE 6.30* 5.67* 4.90* 3.75* 2.97*  CALCIUM 8.5* 8.5* 8.6* 8.4* 8.1*  PHOS 4.6  --   --   --   --      CBC:  Recent Labs Lab 04/05/17 1813 04/06/17 0345 04/10/17 0623 04/11/17 0521  WBC 7.8 7.8 8.4 7.8  NEUTROABS  --   --   --  6.1  HGB 9.3* 8.5* 7.9* 8.3*  HCT 27.4* 23.9* 22.7* 23.6*  MCV 92.9 93.3 91.9 91.1  PLT 224 181 205 189      Lab Results  Component Value Date   HEPBSAG Negative 04/05/2017   HEPBIGM Negative 04/05/2017       Microbiology:  Recent Results (from the past 240 hour(s))  Urine Culture     Status: None   Collection Time: 04/06/17  6:21 PM  Result Value Ref Range Status   Specimen Description URINE, RANDOM  Final   Special Requests NONE  Final   Culture   Final    NO GROWTH Performed at [facility], [address] [place], [place] [address]    Report Status 04/07/2017 FINAL  Final    Coagulation Studies:  Recent Labs  04/10/17 0623  LABPROT 14.2  INR 1.11    Urinalysis: No results for input(s): COLORURINE, LABSPEC, PHURINE, GLUCOSEU, HGBUR, BILIRUBINUR, KETONESUR, PROTEINUR, UROBILINOGEN, NITRITE, LEUKOCYTESUR in the last 72 hours.  Invalid input(s): APPERANCEUR    Imaging: No results found.   Medications:   . sodium chloride 100 mL/hr at 04/11/17 0920  . sodium chloride 10 mL/hr at 04/11/17 0920   . amLODipine  10 mg Oral Daily  . dexamethasone  40 mg Oral  Daily  . docusate sodium  100 mg Oral BID  . feeding supplement (ENSURE ENLIVE)  237 mL Oral BID BM  . fentaNYL      . heparin lock flush      . midazolam      . pantoprazole  40 mg Oral BID  . pneumococcal 23 valent vaccine  0.5 mL Intramuscular Tomorrow-1000  . temazepam  7.5 mg Oral QHS  . vitamin B-12  100 mcg Oral Daily   acetaminophen **OR** acetaminophen, alum & mag hydroxide-simeth, heparin lock flush, heparin lock flush, hydrALAZINE, hydrALAZINE, ondansetron **OR** ondansetron (ZOFRAN) IV, polyethylene glycol, sodium chloride flush, sodium chloride flush  Assessment/ Plan:  66 y.o. african American female with no known medical problems , who was admitted to [facility] on 04/05/2017 for evaluation of acute kidney injury.  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 This will also help [place] to determine the underlying renal issue as well. Therefore we advise against renal biopsy at this time. Creatinine is decreasing daily  and currently down to 2.97. Avoid nephrotoxins as possible in the recovery period.    Hypertension -  Continue amlodipine at this time.  Abnormal TSH Suggesting hyperthyroidism.  Will need endocrine evaluation.     LOS: 6 Brandi [name] 9/17/201811:21 AM  [facility] [place], [place]

## 2017-04-11 NOTE — Discharge Instructions (Signed)
Follow-up with primary care physician in a week   follow-up with nephrology Dr. Holley Raring in a week Follow-up with oncology Dr. Seward Meth in 1 week

## 2017-04-12 ENCOUNTER — Inpatient Hospital Stay: Payer: Medicare Other | Attending: Internal Medicine

## 2017-04-12 ENCOUNTER — Telehealth: Payer: Self-pay | Admitting: *Deleted

## 2017-04-12 VITALS — BP 158/83 | HR 52 | Temp 98.2°F | Resp 18

## 2017-04-12 DIAGNOSIS — E876 Hypokalemia: Secondary | ICD-10-CM | POA: Diagnosis not present

## 2017-04-12 DIAGNOSIS — C9002 Multiple myeloma in relapse: Secondary | ICD-10-CM | POA: Diagnosis not present

## 2017-04-12 DIAGNOSIS — N179 Acute kidney failure, unspecified: Secondary | ICD-10-CM | POA: Diagnosis not present

## 2017-04-12 DIAGNOSIS — F17210 Nicotine dependence, cigarettes, uncomplicated: Secondary | ICD-10-CM | POA: Insufficient documentation

## 2017-04-12 DIAGNOSIS — M8580 Other specified disorders of bone density and structure, unspecified site: Secondary | ICD-10-CM | POA: Insufficient documentation

## 2017-04-12 DIAGNOSIS — Z79899 Other long term (current) drug therapy: Secondary | ICD-10-CM | POA: Insufficient documentation

## 2017-04-12 DIAGNOSIS — N3289 Other specified disorders of bladder: Secondary | ICD-10-CM | POA: Insufficient documentation

## 2017-04-12 DIAGNOSIS — R809 Proteinuria, unspecified: Secondary | ICD-10-CM | POA: Diagnosis not present

## 2017-04-12 DIAGNOSIS — Z5111 Encounter for antineoplastic chemotherapy: Secondary | ICD-10-CM | POA: Insufficient documentation

## 2017-04-12 DIAGNOSIS — I700 Atherosclerosis of aorta: Secondary | ICD-10-CM | POA: Insufficient documentation

## 2017-04-12 DIAGNOSIS — K753 Granulomatous hepatitis, not elsewhere classified: Secondary | ICD-10-CM | POA: Diagnosis not present

## 2017-04-12 DIAGNOSIS — R630 Anorexia: Secondary | ICD-10-CM | POA: Insufficient documentation

## 2017-04-12 DIAGNOSIS — R109 Unspecified abdominal pain: Secondary | ICD-10-CM | POA: Insufficient documentation

## 2017-04-12 LAB — IMMUNOFIXATION ELECTROPHORESIS
IGA: 2145 mg/dL — AB (ref 87–352)
IGM (IMMUNOGLOBULIN M), SRM: 9 mg/dL — AB (ref 26–217)
IgG (Immunoglobin G), Serum: 745 mg/dL (ref 700–1600)
TOTAL PROTEIN ELP: 8.5 g/dL (ref 6.0–8.5)

## 2017-04-12 MED ORDER — BORTEZOMIB CHEMO SQ INJECTION 3.5 MG (2.5MG/ML)
1.3000 mg/m2 | Freq: Once | INTRAMUSCULAR | Status: AC
  Administered 2017-04-12: 2.25 mg via SUBCUTANEOUS
  Filled 2017-04-12: qty 2.25

## 2017-04-12 MED ORDER — PROCHLORPERAZINE MALEATE 10 MG PO TABS
10.0000 mg | ORAL_TABLET | Freq: Once | ORAL | Status: AC
  Administered 2017-04-12: 10 mg via ORAL
  Filled 2017-04-12: qty 1

## 2017-04-12 NOTE — Patient Instructions (Signed)

## 2017-04-12 NOTE — Progress Notes (Addendum)
Discussed with patient's oncologist Dr. Rogue Bussing, patient doesn't need to take by mouth Decadron after discharge, called pharmacy and canceled Decadron orders.pharmacist will notify daughter.ive left VM to daughter to call back with questions

## 2017-04-12 NOTE — Telephone Encounter (Signed)
Received a call from [place], RN in infusion suite-c.ctr. Pt inquiring about msg left on daughter's vm from hospitalist. Pt wanted to confirm with infusion nurse that no oral decadron is needed. I explained that per Dr. Margaretmary Eddy spoke with Dr. Rogue Bussing (per progress note). Dr. Margaretmary Eddy left a vm for daughter-rx for decadron was cnl at pharmacy. Pt does not need to take decadron at this time.   Allyson relayed this information back to daughter. Daughter/pt gave verbal understanding.

## 2017-04-13 ENCOUNTER — Inpatient Hospital Stay: Payer: Medicare Other

## 2017-04-14 ENCOUNTER — Other Ambulatory Visit: Payer: Medicare Other

## 2017-04-15 ENCOUNTER — Other Ambulatory Visit: Payer: Self-pay | Admitting: *Deleted

## 2017-04-15 ENCOUNTER — Inpatient Hospital Stay: Payer: Medicare Other

## 2017-04-15 ENCOUNTER — Inpatient Hospital Stay (HOSPITAL_BASED_OUTPATIENT_CLINIC_OR_DEPARTMENT_OTHER): Payer: Medicare Other | Admitting: Internal Medicine

## 2017-04-15 VITALS — BP 159/84 | HR 51 | Temp 98.2°F | Resp 16 | Wt 144.2 lb

## 2017-04-15 DIAGNOSIS — N3289 Other specified disorders of bladder: Secondary | ICD-10-CM | POA: Diagnosis not present

## 2017-04-15 DIAGNOSIS — R109 Unspecified abdominal pain: Secondary | ICD-10-CM | POA: Diagnosis not present

## 2017-04-15 DIAGNOSIS — Z5111 Encounter for antineoplastic chemotherapy: Secondary | ICD-10-CM | POA: Diagnosis not present

## 2017-04-15 DIAGNOSIS — N179 Acute kidney failure, unspecified: Secondary | ICD-10-CM | POA: Diagnosis not present

## 2017-04-15 DIAGNOSIS — C9002 Multiple myeloma in relapse: Secondary | ICD-10-CM | POA: Diagnosis not present

## 2017-04-15 DIAGNOSIS — R809 Proteinuria, unspecified: Secondary | ICD-10-CM | POA: Diagnosis not present

## 2017-04-15 DIAGNOSIS — R630 Anorexia: Secondary | ICD-10-CM

## 2017-04-15 DIAGNOSIS — Z79899 Other long term (current) drug therapy: Secondary | ICD-10-CM | POA: Diagnosis not present

## 2017-04-15 DIAGNOSIS — K753 Granulomatous hepatitis, not elsewhere classified: Secondary | ICD-10-CM

## 2017-04-15 DIAGNOSIS — M8580 Other specified disorders of bone density and structure, unspecified site: Secondary | ICD-10-CM | POA: Diagnosis not present

## 2017-04-15 DIAGNOSIS — F17210 Nicotine dependence, cigarettes, uncomplicated: Secondary | ICD-10-CM

## 2017-04-15 DIAGNOSIS — E876 Hypokalemia: Secondary | ICD-10-CM | POA: Diagnosis not present

## 2017-04-15 LAB — CBC WITH DIFFERENTIAL/PLATELET
Basophils Absolute: 0.1 10*3/uL (ref 0–0.1)
Basophils Relative: 1 %
EOS ABS: 0.1 10*3/uL (ref 0–0.7)
EOS PCT: 1 %
HCT: 26.5 % — ABNORMAL LOW (ref 35.0–47.0)
Hemoglobin: 9.2 g/dL — ABNORMAL LOW (ref 12.0–16.0)
LYMPHS ABS: 3 10*3/uL (ref 1.0–3.6)
Lymphocytes Relative: 34 %
MCH: 31.9 pg (ref 26.0–34.0)
MCHC: 34.8 g/dL (ref 32.0–36.0)
MCV: 91.7 fL (ref 80.0–100.0)
MONO ABS: 0.6 10*3/uL (ref 0.2–0.9)
Monocytes Relative: 7 %
Neutro Abs: 5 10*3/uL (ref 1.4–6.5)
Neutrophils Relative %: 57 %
PLATELETS: 268 10*3/uL (ref 150–440)
RBC: 2.89 MIL/uL — AB (ref 3.80–5.20)
RDW: 15.1 % — AB (ref 11.5–14.5)
WBC: 8.7 10*3/uL (ref 3.6–11.0)

## 2017-04-15 LAB — COMPREHENSIVE METABOLIC PANEL
ALBUMIN: 3.1 g/dL — AB (ref 3.5–5.0)
ALK PHOS: 80 U/L (ref 38–126)
ALT: 74 U/L — ABNORMAL HIGH (ref 14–54)
ANION GAP: 11 (ref 5–15)
AST: 42 U/L — ABNORMAL HIGH (ref 15–41)
BUN: 18 mg/dL (ref 6–20)
CALCIUM: 8.2 mg/dL — AB (ref 8.9–10.3)
CO2: 26 mmol/L (ref 22–32)
Chloride: 102 mmol/L (ref 101–111)
Creatinine, Ser: 1.75 mg/dL — ABNORMAL HIGH (ref 0.44–1.00)
GFR, EST AFRICAN AMERICAN: 34 mL/min — AB (ref >60)
GFR, EST NON AFRICAN AMERICAN: 29 mL/min — AB (ref >60)
Glucose, Bld: 102 mg/dL — ABNORMAL HIGH (ref 65–99)
POTASSIUM: 3.4 mmol/L — AB (ref 3.5–5.1)
Sodium: 139 mmol/L (ref 135–145)
TOTAL PROTEIN: 8.5 g/dL — AB (ref 6.5–8.1)
Total Bilirubin: 0.4 mg/dL (ref 0.3–1.2)

## 2017-04-15 LAB — SAMPLE TO BLOOD BANK

## 2017-04-15 MED ORDER — ONDANSETRON HCL 4 MG PO TABS
4.0000 mg | ORAL_TABLET | Freq: Once | ORAL | Status: AC
  Administered 2017-04-15: 4 mg via ORAL
  Filled 2017-04-15: qty 1

## 2017-04-15 MED ORDER — ACYCLOVIR 400 MG PO TABS: ORAL_TABLET | ORAL | 3 refills | Status: DC

## 2017-04-15 MED ORDER — DEXAMETHASONE 4 MG PO TABS: 40.0000 mg | ORAL_TABLET | ORAL | 4 refills | Status: DC

## 2017-04-15 MED ORDER — DRONABINOL 5 MG PO CAPS: 5.0000 mg | ORAL_CAPSULE | Freq: Two times a day (BID) | ORAL | 3 refills | Status: DC

## 2017-04-15 MED ORDER — BORTEZOMIB CHEMO SQ INJECTION 3.5 MG (2.5MG/ML)
1.3000 mg/m2 | Freq: Once | INTRAMUSCULAR | Status: AC
  Administered 2017-04-15: 2.25 mg via SUBCUTANEOUS
  Filled 2017-04-15: qty 2.25

## 2017-04-15 NOTE — Assessment & Plan Note (Addendum)
#  Multiple myeloma-stage III. New diagnosis IgA lambda [sep 2527]; cytogenetics/fish pending on the bone marrow biopsy. Currently on Velcade subcutaneous twice weekly/dexamethasone 40 mg weekly- significant improvement of the renal function [C discussion below]. Check bone marrow for amyloidosis [given the proteinuria].   # If patient continues to have improvement of renal function; I would recommend addition of Revlimid. I would recommend RVD induction treatment 4-6 cycles; autologous symptoms of transplant followed by maintenance treatment.   # At a long discussion the patient and daughter that myeloma is in general incurable. However multiple treatment options available- and her treatments could put her disease under control for years. However the treatments are fairly intensive/needing frequent visits to the office/bloodwork etc. I also discussed regarding making referral to [facility] or [facility] for transplant evaluation.   # Acute renal failure [creat 7.5 at presentation]- secondary to multiple myeloma; Proteinuria [1.4 g/day]. Most recent creatinine 1.75/58 improvement. Awaiting congo staining.  # handicapped sticker given.  # Discussed risk of shingles; started on acyclovir.  # Poor appetite/weight loss- recommend referral to nutrition .   # follow up for  9/24 velcade; 1 week; cbc/bmp; 2 weeks/MD/labs/velace; PET prior.

## 2017-04-15 NOTE — Progress Notes (Signed)
[place] NOTE  Patient Care Team: Center, [facility] as PCP - General (General Practice)  CHIEF COMPLAINTS/PURPOSE OF CONSULTATION: Multiple myeloma Multiple myeloma.  #  Oncology History   # SEP 2018- MULTIPLE MYELOMA IgALamda [2.5 gm/dl; K/L= 88/1298]; STAGE III [beta 2 microglobulin=5.5] [presented with acute renal failure; anemia; NO hypercalcemia; Skeletal survey-Normal]; BMBx- 45% plasma cells; FISH/cyto-pending;   # 9/14- velcade SQ twice weekly/Dex 40 mg/week   # Acute renal failure [Dr.Singh; Proteinuria 1.5gm/day ];      Multiple myeloma in relapse (HCC)     HISTORY OF PRESENTING ILLNESS:  Brandi [name] 66 y.o.  female with no significant past medical history/not seeing PCP for many years- was admitted to the hospital for acute renal failure; creatinine of 7.5- noted to have elevated M protein/abnormal Kappa lambda light chain ratio [as summarized above]. Patient was immediately started on dexamethasone pulse dose; and also on Velcade subcutaneous twice weekly. No hypercalcemia; skeletal survey normal. Patient also had a bone marrow biopsy inpatient.  Over the next few days patient's creatinine significantly improved most recently 1.75. Patient is here for follow-up/accompanied by daughter.  Patient complains of intermittent cramping in the abdomen. Otherwise denies any diarrhea. Denies any tingling or numbness. No nausea no vomiting. Complains of poor appetite/family concerned about weight loss. Otherwise no chest pain or shortness of breath. Overall feels much improved since admission.  ROS: A complete 10 point review of system is done which is negative except mentioned above in history of present illness  MEDICAL HISTORY:  No past medical history on file.  SURGICAL HISTORY: No past surgical history on file.  SOCIAL HISTORY:  She lives in [place] with her son. Her daughter lives close by. She smokes about 4-5 cigarettes a  day. No alcohol. She used to work in a [place]. Currently retired.  Social History   Social History  . Marital status: Single    Spouse name: N/A  . Number of children: N/A  . Years of education: N/A   Occupational History  . Not on file.   Social History Main Topics  . Smoking status: Current Every Day Smoker    Packs/day: 0.50    Types: Cigarettes  . Smokeless tobacco: Never Used  . Alcohol use No  . Drug use: No  . Sexual activity: Not on file   Other Topics Concern  . Not on file   Social History Narrative   Lives at home with family. Independent at baseline    FAMILY HISTORY: Family History  Problem Relation Age of Onset  . Pneumonia Mother   . Seizures Father     ALLERGIES:  has No Known Allergies.  MEDICATIONS:  Current Outpatient Prescriptions  Medication Sig Dispense Refill  . acetaminophen (TYLENOL) 325 MG tablet Take 1 tablet (325 mg total) by mouth every 6 (six) hours as needed for mild pain (or Fever >/= 101).    [name] amLODipine (NORVASC) 10 MG tablet Take 1 tablet (10 mg total) by mouth daily. 30 tablet 0  . feeding supplement, ENSURE ENLIVE, (ENSURE ENLIVE) LIQD Take 237 mLs by mouth 2 (two) times daily between meals. 60 Bottle 0  . pantoprazole (PROTONIX) 40 MG tablet Take 1 tablet (40 mg total) by mouth daily.    . polyethylene glycol (MIRALAX / GLYCOLAX) packet Take 17 g by mouth daily as needed for mild constipation. 14 each 0  . temazepam (RESTORIL) 7.5 MG capsule Take 1 capsule (7.5 mg total) by mouth at bedtime.  30 capsule 0  . vitamin B-12 100 MCG tablet Take 1 tablet (100 mcg total) by mouth daily. 30 tablet 0  . acyclovir (ZOVIRAX) 400 MG tablet One pill a day [to prevent shingles] 30 tablet 3  . dexamethasone (DECADRON) 4 MG tablet Take 10 tablets (40 mg total) by mouth once a week. 10 pills in AM with food. 40 tablet 4  . dronabinol (MARINOL) 5 MG capsule Take 1 capsule (5 mg total) by mouth 2 (two) times daily before lunch and supper. 60 capsule  3   No current facility-administered medications for this visit.       [name]  PHYSICAL EXAMINATION: ECOG PERFORMANCE STATUS: 1 - Symptomatic but completely ambulatory  Vitals:   04/15/17 1039  BP: (!) 159/84  Pulse: (!) 51  Resp: 16  Temp: 98.2 F (36.8 C)   Filed Weights   04/15/17 1039  Weight: 144 lb 3.2 oz (65.4 kg)    GENERAL: Well-nourished well-developed; Alert, no distress and comfortable.   Accompanied by her daughter. EYES: no pallor or icterus OROPHARYNX: no thrush or ulceration; good dentition  NECK: supple, no masses felt LYMPH:  no palpable lymphadenopathy in the cervical, axillary or inguinal regions LUNGS: clear to auscultation and  No wheeze or crackles HEART/CVS: regular rate & rhythm and no murmurs; No lower extremity edema ABDOMEN: abdomen soft, non-tender and normal bowel sounds Musculoskeletal:no cyanosis of digits and no clubbing  PSYCH: alert & oriented x 3 with fluent speech NEURO: no focal motor/sensory deficits SKIN:  no rashes or significant lesions  LABORATORY DATA:  I have reviewed the data as listed Lab Results  Component Value Date   WBC 8.7 04/15/2017   HGB 9.2 (L) 04/15/2017   HCT 26.5 (L) 04/15/2017   MCV 91.7 04/15/2017   PLT 268 04/15/2017    Recent Labs  04/05/17 1813  04/07/17 0533  04/10/17 0623 04/11/17 0521 04/15/17 1014  NA 138  < > 142  < > 140 140 139  K 4.0  < > 4.2  < > 4.0 3.9 3.4*  CL 102  < > 112*  < > 108 108 102  CO2 21*  < > 21*  < > 21* 22 26  GLUCOSE 107*  < > 91  < > 102* 99 102*  BUN 55*  < > 41*  < > 30* 35* 18  CREATININE 7.24*  < > 6.30*  < > 3.75* 2.97* 1.75*  CALCIUM 9.5  < > 8.5*  < > 8.4* 8.1* 8.2*  GFRNONAA 5*  < > 6*  < > 12* 16* 29*  GFRAA 6*  < > 7*  < > 14* 18* 34*  PROT 10.1*  --   --   --   --   --  8.5*  ALBUMIN 3.7  --  2.7*  --   --   --  3.1*  AST 22  --   --   --   --   --  42*  ALT 9*  --   --   --   --   --  74*  ALKPHOS 48  --   --   --   --   --  80  BILITOT 0.6  --   --    --   --   --  0.4  < > = values in this interval not displayed.  RADIOGRAPHIC STUDIES: I have personally reviewed the radiological images as listed and agreed with the findings in the  report. Ct Abdomen Pelvis Wo Contrast  Result Date: 04/05/2017 CLINICAL DATA:  Pulsatile abdominal mass. Suspect abdominal aortic aneurysm EXAM: CT ABDOMEN AND PELVIS WITHOUT CONTRAST TECHNIQUE: Multidetector CT imaging of the abdomen and pelvis was performed following the standard protocol without IV contrast. COMPARISON:  None. FINDINGS: Lower chest: Lung bases clear Hepatobiliary: Small calcified hepatic granuloma. No liver mass. Gallbladder and bile ducts normal Pancreas: Negative Spleen: Negative Adrenals/Urinary Tract: No renal mass or obstruction or stone. Urinary bladder normal Stomach/Bowel: Negative for bowel obstruction. No bowel mass or edema. Normal appendix. Vascular/Lymphatic: Atherosclerotic aorta and iliac arteries. Negative for aortic aneurysm. No retroperitoneal mass Reproductive: Small uterine fibroids. Other: No free-fluid Musculoskeletal: Lumbar degenerative changes without acute skeletal abnormality. IMPRESSION: Atherosclerotic aorta without aneurysm. No acute abnormality. Electronically Signed   By: Franchot Gallo M.D.   On: 04/05/2017 19:37   Dg Chest 2 View  Result Date: 04/05/2017 CLINICAL DATA:  Cough and wheezing for 1 week EXAM: CHEST  2 VIEW COMPARISON:  None. FINDINGS: Cardiac shadow is within normal limits. Mild aortic calcifications are seen. Lungs are well aerated bilaterally with mild interstitial changes. No focal confluent infiltrate is seen. No sizable effusion is noted. Mild degenerative changes of the thoracic spine are noted. IMPRESSION: Mild interstitial changes which may be chronic in nature. A mild degree of acute bronchitis could not be totally excluded. Electronically Signed   By: Inez Catalina M.D.   On: 04/05/2017 18:44   US Renal  Result Date: 04/05/2017 CLINICAL DATA:   Acute kidney injury. EXAM: RENAL / URINARY TRACT ULTRASOUND COMPLETE COMPARISON:  04/05/2017 FINDINGS: Right Kidney: Length: 13.4 cm. Increased cortical echogenicity. No mass or hydronephrosis visualized. Left Kidney: Length: 13.8 cm. Increased cortical echogenicity. No mass or hydronephrosis visualized. Bladder: Appears normal for degree of bladder distention. IMPRESSION: 1. No hydronephrosis. 2. Bilateral echogenic kidneys compatible with medical renal disease. Electronically Signed   By: Kerby Moors M.D.   On: 04/05/2017 20:43   Ct Biopsy  Result Date: 04/11/2017 INDICATION: 66 year old with acute renal failure and myeloma workup. EXAM: CT GUIDED BONE MARROW ASPIRATES AND BIOPSY Physician: Stephan Minister. Anselm Pancoast, MD MEDICATIONS: None. ANESTHESIA/SEDATION: Fentanyl 50 mcg IV; Versed 1.5 mg IV Moderate Sedation Time:  29 minutes The patient was continuously monitored during the procedure by the interventional radiology nurse under my direct supervision. COMPLICATIONS: None immediate. PROCEDURE: The procedure was explained to the patient. The risks and benefits of the procedure were discussed and the patient's questions were addressed. Informed consent was obtained from the patient. The patient was placed prone on CT scan. Images of the pelvis were obtained. The right side of back was prepped and draped in sterile fashion. The skin and right posterior iliac bone were anesthetized with 1% lidocaine. 11 gauge bone needle was directed into the right iliac bone with CT guidance. Two aspirates and two core biopsies obtained. Bandage placed over the puncture site. FINDINGS: Bone needle directed into the posterior right ilium. IMPRESSION: CT guided bone marrow aspirates and core biopsy. Electronically Signed   By: Markus Daft M.D.   On: 04/11/2017 12:59   Dg Bone Survey Met  Result Date: 04/08/2017 CLINICAL DATA:  Multiple myeloma. EXAM: METASTATIC BONE SURVEY COMPARISON:  Chest x-ray 04/05/2017 FINDINGS: Lateral image of  the skull shows no bone lesion. There is heterogeneous density from hair braids. AP view of the chest shows no definite rib lesion or shoulder girdle lesion. Chronic aortic tortuosity. Mild atelectasis at the right base. No edema, effusion, or pneumothorax. No  evidence of cervical spine lesion. Bulky spondylotic spurring in the mid and lower cervical spine. Diffuse cervical facet arthropathy with spurring. Bilateral upper extremities showed no focal bone lesion. There is an osteopenic appearance which accounts for trabecular prominence diffusely and symmetrically in the humerus. No bony pelvis or femoral lesion is seen. There is osteopenia and atherosclerosis. No tibia or fibula lesion is noted. No visible spinal lesion. There are hypoplastic T12 ribs. There is an L2 right lamina lesion by CTA 04/05/2017. Prominent lumbar facet arthropathy with L4-5 grade 1 anterolisthesis. IMPRESSION: 1. No visible myeloma on this bone survey. In retrospect, there is a L2 right lamina lesion on 04/05/2017 abdominal CT. 2. Mild right base atelectasis, new from 3 days ago. 3. Osteopenia. Electronically Signed   By: Monte Fantasia M.D.   On: 04/08/2017 10:11    ASSESSMENT & PLAN:   Multiple myeloma in relapse ([place]) # Multiple myeloma-stage III. New diagnosis IgA lambda [sep 1156]; cytogenetics/fish pending on the bone marrow biopsy. Currently on Velcade subcutaneous twice weekly/dexamethasone 40 mg weekly- significant improvement of the renal function [C discussion below]. Check bone marrow for amyloidosis [given the proteinuria].   # If patient continues to have improvement of renal function; I would recommend addition of Revlimid. I would recommend RVD induction treatment 4-6 cycles; autologous symptoms of transplant followed by maintenance treatment.   # At a long discussion the patient and daughter that myeloma is in general incurable. However multiple treatment options available- and her treatments could put her  disease under control for years. However the treatments are fairly intensive/needing frequent visits to the office/bloodwork etc. I also discussed regarding making referral to [facility] or [facility] for transplant evaluation.   # Acute renal failure [creat 7.5 at presentation]- secondary to multiple myeloma; Proteinuria [1.4 g/day]. Most recent creatinine 1.75/58 improvement. Awaiting congo staining.  # handicapped sticker given.  # Discussed risk of shingles; started on acyclovir.  # Poor appetite/weight loss- recommend referral to nutrition .   # follow up for  9/24 velcade; 1 week; cbc/bmp; 2 weeks/MD/labs/velace; PET prior.   All questions were answered. The patient knows to call the clinic with any problems, questions or concerns.    [name], MD 04/19/2017 8:05 AM

## 2017-04-18 ENCOUNTER — Inpatient Hospital Stay: Payer: Medicare Other

## 2017-04-18 VITALS — BP 147/79 | HR 62 | Temp 98.4°F | Resp 18

## 2017-04-18 DIAGNOSIS — Z5111 Encounter for antineoplastic chemotherapy: Secondary | ICD-10-CM | POA: Diagnosis not present

## 2017-04-18 DIAGNOSIS — N3289 Other specified disorders of bladder: Secondary | ICD-10-CM | POA: Diagnosis not present

## 2017-04-18 DIAGNOSIS — R809 Proteinuria, unspecified: Secondary | ICD-10-CM | POA: Diagnosis not present

## 2017-04-18 DIAGNOSIS — N179 Acute kidney failure, unspecified: Secondary | ICD-10-CM | POA: Diagnosis not present

## 2017-04-18 DIAGNOSIS — E876 Hypokalemia: Secondary | ICD-10-CM | POA: Diagnosis not present

## 2017-04-18 DIAGNOSIS — C9002 Multiple myeloma in relapse: Secondary | ICD-10-CM | POA: Diagnosis not present

## 2017-04-18 MED ORDER — BORTEZOMIB CHEMO SQ INJECTION 3.5 MG (2.5MG/ML)
1.3000 mg/m2 | Freq: Once | INTRAMUSCULAR | Status: AC
  Administered 2017-04-18: 2.25 mg via SUBCUTANEOUS
  Filled 2017-04-18: qty 2.25

## 2017-04-18 MED ORDER — PROCHLORPERAZINE MALEATE 10 MG PO TABS
10.0000 mg | ORAL_TABLET | Freq: Once | ORAL | Status: AC
  Administered 2017-04-18: 10 mg via ORAL
  Filled 2017-04-18: qty 1

## 2017-04-18 NOTE — Progress Notes (Signed)
Reviewed creatinine/labs with Dr. Rogue Bussing, proceed with Velcade today per Dr. Rogue Bussing. LJ

## 2017-04-19 ENCOUNTER — Telehealth: Payer: Self-pay | Admitting: Internal Medicine

## 2017-04-19 ENCOUNTER — Other Ambulatory Visit: Payer: Self-pay | Admitting: Internal Medicine

## 2017-04-19 NOTE — Telephone Encounter (Signed)
Please schedule an appointment with me on 9/28 at 1:30 [okay to double book]; I need to talk to her re: revlimid;PET. Also move her lab appt to same time on that day. No infusion planned on that day. Thx

## 2017-04-19 NOTE — Telephone Encounter (Signed)
Brandi [name], please schedule per md orders below

## 2017-04-21 ENCOUNTER — Ambulatory Visit
Admission: RE | Admit: 2017-04-21 | Discharge: 2017-04-21 | Disposition: A | Discharge status: Home / Self Care | Payer: Medicare Other | Source: Ambulatory Visit | Attending: Internal Medicine | Admitting: Internal Medicine

## 2017-04-21 DIAGNOSIS — C9002 Multiple myeloma in relapse: Secondary | ICD-10-CM | POA: Insufficient documentation

## 2017-04-21 DIAGNOSIS — I2510 Atherosclerotic heart disease of native coronary artery without angina pectoris: Secondary | ICD-10-CM | POA: Insufficient documentation

## 2017-04-21 DIAGNOSIS — I700 Atherosclerosis of aorta: Secondary | ICD-10-CM | POA: Insufficient documentation

## 2017-04-21 DIAGNOSIS — M899 Disorder of bone, unspecified: Secondary | ICD-10-CM | POA: Insufficient documentation

## 2017-04-21 DIAGNOSIS — C9000 Multiple myeloma not having achieved remission: Secondary | ICD-10-CM | POA: Diagnosis not present

## 2017-04-21 DIAGNOSIS — R938 Abnormal findings on diagnostic imaging of other specified body structures: Secondary | ICD-10-CM | POA: Diagnosis not present

## 2017-04-21 LAB — GLUCOSE, CAPILLARY: GLUCOSE-CAPILLARY: 78 mg/dL (ref 65–99)

## 2017-04-21 MED ORDER — FLUDEOXYGLUCOSE F - 18 (FDG) INJECTION
13.0000 | Freq: Once | INTRAVENOUS | Status: AC | PRN
  Administered 2017-04-21: 13 via INTRAVENOUS

## 2017-04-22 ENCOUNTER — Inpatient Hospital Stay (HOSPITAL_BASED_OUTPATIENT_CLINIC_OR_DEPARTMENT_OTHER): Payer: Medicare Other | Admitting: Internal Medicine

## 2017-04-22 ENCOUNTER — Inpatient Hospital Stay: Payer: Medicare Other

## 2017-04-22 ENCOUNTER — Telehealth: Payer: Self-pay | Admitting: Internal Medicine

## 2017-04-22 VITALS — BP 154/79 | HR 67 | Temp 97.6°F | Resp 20 | Ht 62.0 in | Wt 143.0 lb

## 2017-04-22 DIAGNOSIS — C9002 Multiple myeloma in relapse: Secondary | ICD-10-CM

## 2017-04-22 DIAGNOSIS — R630 Anorexia: Secondary | ICD-10-CM

## 2017-04-22 DIAGNOSIS — N3289 Other specified disorders of bladder: Secondary | ICD-10-CM | POA: Diagnosis not present

## 2017-04-22 DIAGNOSIS — Z79899 Other long term (current) drug therapy: Secondary | ICD-10-CM | POA: Diagnosis not present

## 2017-04-22 DIAGNOSIS — I700 Atherosclerosis of aorta: Secondary | ICD-10-CM

## 2017-04-22 DIAGNOSIS — K753 Granulomatous hepatitis, not elsewhere classified: Secondary | ICD-10-CM | POA: Diagnosis not present

## 2017-04-22 DIAGNOSIS — M8580 Other specified disorders of bone density and structure, unspecified site: Secondary | ICD-10-CM | POA: Diagnosis not present

## 2017-04-22 DIAGNOSIS — F17210 Nicotine dependence, cigarettes, uncomplicated: Secondary | ICD-10-CM

## 2017-04-22 DIAGNOSIS — N179 Acute kidney failure, unspecified: Secondary | ICD-10-CM

## 2017-04-22 DIAGNOSIS — R809 Proteinuria, unspecified: Secondary | ICD-10-CM

## 2017-04-22 DIAGNOSIS — E876 Hypokalemia: Secondary | ICD-10-CM | POA: Diagnosis not present

## 2017-04-22 DIAGNOSIS — Z5111 Encounter for antineoplastic chemotherapy: Secondary | ICD-10-CM | POA: Diagnosis not present

## 2017-04-22 LAB — CBC WITH DIFFERENTIAL/PLATELET
BASOS PCT: 0 %
Basophils Absolute: 0 10*3/uL (ref 0–0.1)
EOS ABS: 0 10*3/uL (ref 0–0.7)
Eosinophils Relative: 1 %
HEMATOCRIT: 25.6 % — AB (ref 35.0–47.0)
HEMOGLOBIN: 9 g/dL — AB (ref 12.0–16.0)
LYMPHS ABS: 2.5 10*3/uL (ref 1.0–3.6)
Lymphocytes Relative: 27 %
MCH: 32 pg (ref 26.0–34.0)
MCHC: 35.1 g/dL (ref 32.0–36.0)
MCV: 91.2 fL (ref 80.0–100.0)
MONOS PCT: 5 %
Monocytes Absolute: 0.4 10*3/uL (ref 0.2–0.9)
NEUTROS ABS: 6.1 10*3/uL (ref 1.4–6.5)
NEUTROS PCT: 67 %
Platelets: 240 10*3/uL (ref 150–440)
RBC: 2.81 MIL/uL — AB (ref 3.80–5.20)
RDW: 15.7 % — ABNORMAL HIGH (ref 11.5–14.5)
WBC: 9.1 10*3/uL (ref 3.6–11.0)

## 2017-04-22 LAB — BASIC METABOLIC PANEL
Anion gap: 10 (ref 5–15)
BUN: 17 mg/dL (ref 6–20)
CHLORIDE: 103 mmol/L (ref 101–111)
CO2: 26 mmol/L (ref 22–32)
Calcium: 8.6 mg/dL — ABNORMAL LOW (ref 8.9–10.3)
Creatinine, Ser: 1.38 mg/dL — ABNORMAL HIGH (ref 0.44–1.00)
GFR, EST AFRICAN AMERICAN: 45 mL/min — AB (ref >60)
GFR, EST NON AFRICAN AMERICAN: 39 mL/min — AB (ref >60)
Glucose, Bld: 82 mg/dL (ref 65–99)
POTASSIUM: 2.9 mmol/L — AB (ref 3.5–5.1)
SODIUM: 139 mmol/L (ref 135–145)

## 2017-04-22 MED ORDER — POTASSIUM CHLORIDE CRYS ER 20 MEQ PO TBCR: 20.0000 meq | EXTENDED_RELEASE_TABLET | Freq: Two times a day (BID) | ORAL | 0 refills | Status: DC

## 2017-04-22 MED ORDER — LENALIDOMIDE 10 MG PO CAPS: 10.0000 mg | ORAL_CAPSULE | Freq: Every day | ORAL | 3 refills | Status: DC

## 2017-04-22 NOTE — Progress Notes (Signed)
[place] NOTE  Patient Care Team: Center, [facility] as PCP - General (General Practice)  CHIEF COMPLAINTS/PURPOSE OF CONSULTATION: Multiple myeloma Multiple myeloma.  #  Oncology History   # SEP 2018- MULTIPLE MYELOMA IgALamda [2.5 gm/dl; K/L= 88/1298]; STAGE III [beta 2 microglobulin=5.5] [presented with acute renal failure; anemia; NO hypercalcemia; Skeletal survey-Normal]; BMBx- 45% plasma cells; FISH/cyto-pending; SEP 2018- PET- L3 posterior element lesion.   # 9/14- velcade SQ twice weekly/Dex 40 mg/week; OCT 5th 2018-Start RVD   # Acute renal failure [Dr.Singh; Proteinuria 1.5gm/day ]; acyclovir/Asprin     Multiple myeloma in relapse (HCC)     HISTORY OF PRESENTING ILLNESS:  EVENY ANASTAS 66 y.o.  female recently diagnosed multiple myeloma- currently on Velcade and dexamethasone- is here to review the results of her PET scan.  Patient states her appetite is good. She has gained 1 pound. Denies any nausea vomiting. She has been urinating well.  Otherwise denies any diarrhea. Denies any tingling or numbness. No nausea no vomiting.  Otherwise no chest pain or shortness of breath.  ROS: A complete 10 point review of system is done which is negative except mentioned above in history of present illness  MEDICAL HISTORY:  No past medical history on file.  SURGICAL HISTORY: No past surgical history on file.  SOCIAL HISTORY:  She lives in [place] with her son. Her daughter lives close by. She smokes about 4-5 cigarettes a day. No alcohol. She used to work in a [employer]. Currently retired.  Social History   Social History  . Marital status: Single    Spouse name: N/A  . Number of children: N/A  . Years of education: N/A   Occupational History  . Not on file.   Social History Main Topics  . Smoking status: Current Every Day Smoker    Packs/day: 0.50    Types: Cigarettes  . Smokeless tobacco: Never Used  . Alcohol use No  .  Drug use: No  . Sexual activity: Not on file   Other Topics Concern  . Not on file   Social History Narrative   Lives at home with family. Independent at baseline    FAMILY HISTORY: Family History  Problem Relation Age of Onset  . Pneumonia Mother   . Seizures Father     ALLERGIES:  has No Known Allergies.  MEDICATIONS:  Current Outpatient Prescriptions  Medication Sig Dispense Refill  . acetaminophen (TYLENOL) 325 MG tablet Take 1 tablet (325 mg total) by mouth every 6 (six) hours as needed for mild pain (or Fever >/= 101).    [name] acyclovir (ZOVIRAX) 400 MG tablet One pill a day [to prevent shingles] 30 tablet 3  . amLODipine (NORVASC) 10 MG tablet Take 1 tablet (10 mg total) by mouth daily. 30 tablet 0  . dexamethasone (DECADRON) 4 MG tablet Take 10 tablets (40 mg total) by mouth once a week. 10 pills in AM with food. 40 tablet 4  . feeding supplement, ENSURE ENLIVE, (ENSURE ENLIVE) LIQD Take 237 mLs by mouth 2 (two) times daily between meals. 60 Bottle 0  . pantoprazole (PROTONIX) 40 MG tablet Take 1 tablet (40 mg total) by mouth daily.    . polyethylene glycol (MIRALAX / GLYCOLAX) packet Take 17 g by mouth daily as needed for mild constipation. 14 each 0  . temazepam (RESTORIL) 7.5 MG capsule Take 1 capsule (7.5 mg total) by mouth at bedtime. 30 capsule 0  . vitamin B-12 100 MCG tablet Take  1 tablet (100 mcg total) by mouth daily. 30 tablet 0  . dronabinol (MARINOL) 5 MG capsule Take 1 capsule (5 mg total) by mouth 2 (two) times daily before lunch and supper. (Patient not taking: Reported on 04/22/2017) 60 capsule 3  . lenalidomide (REVLIMID) 10 MG capsule Take 1 capsule (10 mg total) by mouth daily. 2 weeks ON and 1 week OFF 14 capsule 3  . potassium chloride SA (K-DUR,KLOR-CON) 20 MEQ tablet Take 1 tablet (20 mEq total) by mouth 2 (two) times daily. 30 tablet 0   No current facility-administered medications for this visit.       [name]  PHYSICAL EXAMINATION: ECOG PERFORMANCE  STATUS: 1 - Symptomatic but completely ambulatory  Vitals:   04/22/17 1400  BP: (!) 154/79  Pulse: 67  Resp: 20  Temp: 97.6 F (36.4 C)   Filed Weights   04/22/17 1400  Weight: 143 lb (64.9 kg)    GENERAL: Well-nourished well-developed; Alert, no distress and comfortable.   Accompanied by her daughter. EYES: no pallor or icterus OROPHARYNX: no thrush or ulceration; good dentition  NECK: supple, no masses felt LYMPH:  no palpable lymphadenopathy in the cervical, axillary or inguinal regions LUNGS: clear to auscultation and  No wheeze or crackles HEART/CVS: regular rate & rhythm and no murmurs; No lower extremity edema ABDOMEN: abdomen soft, non-tender and normal bowel sounds Musculoskeletal:no cyanosis of digits and no clubbing  PSYCH: alert & oriented x 3 with fluent speech NEURO: no focal motor/sensory deficits SKIN:  no rashes or significant lesions  LABORATORY DATA:  I have reviewed the data as listed Lab Results  Component Value Date   WBC 9.1 04/22/2017   HGB 9.0 (L) 04/22/2017   HCT 25.6 (L) 04/22/2017   MCV 91.2 04/22/2017   PLT 240 04/22/2017    Recent Labs  04/05/17 1813  04/07/17 0533  04/11/17 0521 04/15/17 1014 04/22/17 1415  NA 138  < > 142  < > 140 139 139  K 4.0  < > 4.2  < > 3.9 3.4* 2.9*  CL 102  < > 112*  < > 108 102 103  CO2 21*  < > 21*  < > '22 26 26  ' GLUCOSE 107*  < > 91  < > 99 102* 82  BUN 55*  < > 41*  < > 35* 18 17  CREATININE 7.24*  < > 6.30*  < > 2.97* 1.75* 1.38*  CALCIUM 9.5  < > 8.5*  < > 8.1* 8.2* 8.6*  GFRNONAA 5*  < > 6*  < > 16* 29* 39*  GFRAA 6*  < > 7*  < > 18* 34* 45*  PROT 10.1*  --   --   --   --  8.5*  --   ALBUMIN 3.7  --  2.7*  --   --  3.1*  --   AST 22  --   --   --   --  42*  --   ALT 9*  --   --   --   --  74*  --   ALKPHOS 48  --   --   --   --  80  --   BILITOT 0.6  --   --   --   --  0.4  --   < > = values in this interval not displayed.  RADIOGRAPHIC STUDIES: I have personally reviewed the radiological  images as listed and agreed with the findings in the report. Ct  Abdomen Pelvis Wo Contrast  Result Date: 04/05/2017 CLINICAL DATA:  Pulsatile abdominal mass. Suspect abdominal aortic aneurysm EXAM: CT ABDOMEN AND PELVIS WITHOUT CONTRAST TECHNIQUE: Multidetector CT imaging of the abdomen and pelvis was performed following the standard protocol without IV contrast. COMPARISON:  None. FINDINGS: Lower chest: Lung bases clear Hepatobiliary: Small calcified hepatic granuloma. No liver mass. Gallbladder and bile ducts normal Pancreas: Negative Spleen: Negative Adrenals/Urinary Tract: No renal mass or obstruction or stone. Urinary bladder normal Stomach/Bowel: Negative for bowel obstruction. No bowel mass or edema. Normal appendix. Vascular/Lymphatic: Atherosclerotic aorta and iliac arteries. Negative for aortic aneurysm. No retroperitoneal mass Reproductive: Small uterine fibroids. Other: No free-fluid Musculoskeletal: Lumbar degenerative changes without acute skeletal abnormality. IMPRESSION: Atherosclerotic aorta without aneurysm. No acute abnormality. Electronically Signed   By: Franchot Gallo M.D.   On: 04/05/2017 19:37   Dg Chest 2 View  Result Date: 04/05/2017 CLINICAL DATA:  Cough and wheezing for 1 week EXAM: CHEST  2 VIEW COMPARISON:  None. FINDINGS: Cardiac shadow is within normal limits. Mild aortic calcifications are seen. Lungs are well aerated bilaterally with mild interstitial changes. No focal confluent infiltrate is seen. No sizable effusion is noted. Mild degenerative changes of the thoracic spine are noted. IMPRESSION: Mild interstitial changes which may be chronic in nature. A mild degree of acute bronchitis could not be totally excluded. Electronically Signed   By: Inez Catalina M.D.   On: 04/05/2017 18:44   US Renal  Result Date: 04/05/2017 CLINICAL DATA:  Acute kidney injury. EXAM: RENAL / URINARY TRACT ULTRASOUND COMPLETE COMPARISON:  04/05/2017 FINDINGS: Right Kidney: Length: 13.4 cm.  Increased cortical echogenicity. No mass or hydronephrosis visualized. Left Kidney: Length: 13.8 cm. Increased cortical echogenicity. No mass or hydronephrosis visualized. Bladder: Appears normal for degree of bladder distention. IMPRESSION: 1. No hydronephrosis. 2. Bilateral echogenic kidneys compatible with medical renal disease. Electronically Signed   By: Kerby Moors M.D.   On: 04/05/2017 20:43   Nm Pet Image Initial (pi) Whole Body  Result Date: 04/21/2017 CLINICAL DATA:  Initial treatment strategy for multiple myeloma. EXAM: NUCLEAR MEDICINE PET WHOLE BODY TECHNIQUE: 13.0 mCi F-18 FDG was injected intravenously. Full-ring PET imaging was performed from the vertex to the feet after the radiotracer. CT data was obtained and used for attenuation correction and anatomic localization. FASTING BLOOD GLUCOSE:  Value: 78 mg/dl COMPARISON:  None. FINDINGS: HEAD/NECK No hypermetabolic activity in the scalp. No hypermetabolic cervical lymph nodes. CHEST No hypermetabolic mediastinal or hilar nodes. No suspicious pulmonary nodules on the CT scan. ABDOMEN/PELVIS There is a focus of uptake in the skin and subcutaneous tissues of the right pelvis, likely an injection site. Recommend clinical correlation to exclude a skin lesion. No other abnormal uptake in the abdomen or pelvis soft tissues. SKELETON There is mild uptake posteriorly in the right iliac bone at the site of recent bone marrow biopsy, likely reactive. There is an FDG avid lytic lesion in the right posterior element of L3 on series 3, image 180 with a maximum SUV of 6.2. There is mild increased uptake in the anterior inferior aspect of L5 without CT correlate on image 210 with a maximum SUV of 3.9. No other abnormal bony uptake identified. EXTREMITIES No abnormal hypermetabolic activity in the lower extremities. IMPRESSION: 1. There is an FDG avid lytic lesion in the right posterior element of L3 consistent with myelomatous involvement. More mild uptake  without CT correlate in L5 is nonspecific. Recommend attention on follow-up. No other bony involvement identified. 2. Atherosclerotic  changes seen in the aorta. Coronary artery calcifications. 3. Nodular uptake in the skin and subcutaneous tissues of the right pelvis, likely an injection site. Recommend clinical correlation to exclude a skin lesion. Aortic Atherosclerosis (ICD10-I70.0). Electronically Signed   By: Dorise Bullion III M.D   On: 04/21/2017 15:09   Ct Biopsy  Result Date: 04/11/2017 INDICATION: 66 year old with acute renal failure and myeloma workup. EXAM: CT GUIDED BONE MARROW ASPIRATES AND BIOPSY Physician: Stephan Minister. Anselm Pancoast, MD MEDICATIONS: None. ANESTHESIA/SEDATION: Fentanyl 50 mcg IV; Versed 1.5 mg IV Moderate Sedation Time:  29 minutes The patient was continuously monitored during the procedure by the interventional radiology nurse under my direct supervision. COMPLICATIONS: None immediate. PROCEDURE: The procedure was explained to the patient. The risks and benefits of the procedure were discussed and the patient's questions were addressed. Informed consent was obtained from the patient. The patient was placed prone on CT scan. Images of the pelvis were obtained. The right side of back was prepped and draped in sterile fashion. The skin and right posterior iliac bone were anesthetized with 1% lidocaine. 11 gauge bone needle was directed into the right iliac bone with CT guidance. Two aspirates and two core biopsies obtained. Bandage placed over the puncture site. FINDINGS: Bone needle directed into the posterior right ilium. IMPRESSION: CT guided bone marrow aspirates and core biopsy. Electronically Signed   By: Markus Daft M.D.   On: 04/11/2017 12:59   Dg Bone Survey Met  Result Date: 04/08/2017 CLINICAL DATA:  Multiple myeloma. EXAM: METASTATIC BONE SURVEY COMPARISON:  Chest x-ray 04/05/2017 FINDINGS: Lateral image of the skull shows no bone lesion. There is heterogeneous density from hair  braids. AP view of the chest shows no definite rib lesion or shoulder girdle lesion. Chronic aortic tortuosity. Mild atelectasis at the right base. No edema, effusion, or pneumothorax. No evidence of cervical spine lesion. Bulky spondylotic spurring in the mid and lower cervical spine. Diffuse cervical facet arthropathy with spurring. Bilateral upper extremities showed no focal bone lesion. There is an osteopenic appearance which accounts for trabecular prominence diffusely and symmetrically in the humerus. No bony pelvis or femoral lesion is seen. There is osteopenia and atherosclerosis. No tibia or fibula lesion is noted. No visible spinal lesion. There are hypoplastic T12 ribs. There is an L2 right lamina lesion by CTA 04/05/2017. Prominent lumbar facet arthropathy with L4-5 grade 1 anterolisthesis. IMPRESSION: 1. No visible myeloma on this bone survey. In retrospect, there is a L2 right lamina lesion on 04/05/2017 abdominal CT. 2. Mild right base atelectasis, new from 3 days ago. 3. Osteopenia. Electronically Signed   By: Monte Fantasia M.D.   On: 04/08/2017 10:11   IMPRESSION: 1. There is an FDG avid lytic lesion in the right posterior element of L3 consistent with myelomatous involvement. More mild uptake without CT correlate in L5 is nonspecific. Recommend attention on follow-up. No other bony involvement identified. 2. Atherosclerotic changes seen in the aorta. Coronary artery calcifications. 3. Nodular uptake in the skin and subcutaneous tissues of the right pelvis, likely an injection site. Recommend clinical correlation to exclude a skin lesion. Aortic Atherosclerosis (ICD10-I70.0).   Electronically Signed   By: Dorise Bullion III M.D   On: 04/21/2017 15:09  ASSESSMENT & PLAN:   Multiple myeloma in relapse [facility]) # Multiple myeloma-stage III. New diagnosis IgA lambda [sep 6045]; cytogenetics/fish pending on the bone marrow biopsy. Currently on Velcade subcutaneous twice  weekly/dexamethasone 40 mg weekly- significant improvement of the renal function [C discussion below].  Congo stain negative for amyloidosis. PET scan shows L3 pedicle  # Given the continued improvement in kidney function- I would recommend addition of Revlimid. I would recommend RVD induction treatment 4-6 cycles; autologous symptoms of transplant followed by maintenance treatment.   # Discussed the potential side effects of Revlimid including but not limited to nausea vomiting diarrhea cytopenias; risk of teratogenic. Given the renal dysfunction would plan to start at 10 mg - 2 weeks on; one week off. Discussed the risk of DVTs/PE- recommend adding aspirin 325 mg a day.  # Acute renal failure [creat 7.5 at presentation]- secondary to multiple myeloma; Proteinuria [1.4 g/day]. Most recent creatinine 1.27/ GFR ~30  # Severe hypokalemia potassium 2.9; recommend potassium supplementation 20 meQ twice a day   # keep appt as scheuelded.  # Skeletal lesion- recommend X [place]eva; at next visit.   # I reviewed the blood work- with the patient in detail; also reviewed the imaging independently [as summarized above]; and with the patient in detail.   All questions were answered. The patient knows to call the clinic with any problems, questions or concerns.    [name], MD 04/22/2017 5:50 PM

## 2017-04-22 NOTE — Progress Notes (Signed)
Patient here to discuss pet scan results with provider. She has no medical complaints.  New start for Revlimid - all consents and [facility] program consent signed. rx faxed to [facility].

## 2017-04-22 NOTE — Progress Notes (Signed)
DISCONTINUE ON PATHWAY REGIMEN - Multiple Myeloma and Other Plasma Cell Dyscrasias     A cycle is every 21 days:     Bortezomib      Cyclophosphamide      Dexamethasone   **Always confirm dose/schedule in your pharmacy ordering system**    REASON: Other Reason PRIOR TREATMENT: [id number]: CyBorD (Cyclophosphamide (IV) + Bortezomib Subcut D1, 4, 8, 11 + Dexamethasone) q21 Days x 4-8 Cycles TREATMENT RESPONSE: Unable to Evaluate  START ON PATHWAY REGIMEN - Multiple Myeloma and Other Plasma Cell Dyscrasias     A cycle is every 21 days:     Bortezomib      Lenalidomide      Dexamethasone   **Always confirm dose/schedule in your pharmacy ordering system**    Patient Characteristics: Newly Diagnosed, Transplant Eligible, Unknown or Awaiting Test Results R-ISS Staging: III Disease Classification: Newly Diagnosed Is Patient Eligible for Transplant<= Transplant Eligible Risk Status: Awaiting Test Results Intent of Therapy: Non-Curative / Palliative Intent, Discussed with Patient

## 2017-04-22 NOTE — Assessment & Plan Note (Addendum)
#  Multiple myeloma-stage III. New diagnosis IgA lambda [sep 4458]; cytogenetics/fish pending on the bone marrow biopsy. Currently on Velcade subcutaneous twice weekly/dexamethasone 40 mg weekly- significant improvement of the renal function [C discussion below]. Congo stain negative for amyloidosis. PET scan shows L3 pedicle  # Given the continued improvement in kidney function- I would recommend addition of Revlimid. I would recommend RVD induction treatment 4-6 cycles; autologous symptoms of transplant followed by maintenance treatment.   # Discussed the potential side effects of Revlimid including but not limited to nausea vomiting diarrhea cytopenias; risk of teratogenic. Given the renal dysfunction would plan to start at 10 mg - 2 weeks on; one week off. Discussed the risk of DVTs/PE- recommend adding aspirin 325 mg a day.  # Acute renal failure [creat 7.5 at presentation]- secondary to multiple myeloma; Proteinuria [1.4 g/day]. Most recent creatinine 1.27/ GFR ~30  # Severe hypokalemia potassium 2.9; recommend potassium supplementation 20 meQ twice a day   # keep appt as scheuelded.  # Skeletal lesion- recommend X Geva; at next visit.   # I reviewed the blood work- with the patient in detail; also reviewed the imaging independently [as summarized above]; and with the patient in detail.

## 2017-04-22 NOTE — Telephone Encounter (Signed)
x

## 2017-04-25 ENCOUNTER — Telehealth: Payer: Self-pay | Admitting: Pharmacist

## 2017-04-25 NOTE — Telephone Encounter (Signed)
Oral Chemotherapy Pharmacist Encounter  Received a call from [facility] letting us know that due to insurance restriction they are not able to fill the prescription and they were forwarding the prescription to [facility].   I removed [facility] from the preferred pharmacy list and added [employer].  Melony Caudle will follow-up patient medication access.   Thank you,  Darl Pikes, PharmD, BCPS Hematology/Oncology Clinical Pharmacist [facility] [phone]  04/25/2017 11:14 AM

## 2017-04-27 NOTE — Telephone Encounter (Signed)
Oral Chemotherapy Pharmacist Encounter Received transferred VM from [place] asking about patient allergies and ICD-10 code. Called [employer] back [phone]) and provided information. Patients copay is $2.52. They will call patient to set-up shipment. She should have medication in hand 10/4 or 10/5.  BMP/CBC from 04/22/17 assessed, no relevant lab abnormalities. Prescription dose and frequency assessed.   Current medication list in Epic reviewed, no DDIs with Revlimid identified.  I spoke with patient for overview of new oral chemotherapy medication: Revlimid for the treatment of multiple myeloma, planned duration until disease progression or unacceptable drug toxicity. Patient is currently receiving Velcade/dexamethasone and Revlimid will be added to this regimen due to improved renal function.  Pt is doing well. Counseled patient on administration, dosing, side effects, monitoring, drug-food interactions, safe handling, storage, and disposal. Patient will take 1 capsule (10 mg total) by mouth daily. 2 weeks ON and 1 week OFF.  Side effects include but not limited to: diarrhea, constipation, fatigue, peripheral edema, rash.    Reviewed with patient importance of keeping a medication schedule and plan for any missed doses.  Ms. Gasparyan voiced understanding and appreciation.   All questions answered.  Provided patient with [facility] phone number. Patient knows to call the office with questions or concerns. Oral Chemotherapy [facility] will continue to follow.  Thank you,  Darl Pikes, PharmD, BCPS Hematology/Oncology Clinical Pharmacist [facility] [phone]  04/27/2017 12:01 PM

## 2017-04-28 ENCOUNTER — Encounter (HOSPITAL_COMMUNITY): Payer: Self-pay

## 2017-04-29 ENCOUNTER — Inpatient Hospital Stay: Payer: Medicare Other

## 2017-04-29 ENCOUNTER — Inpatient Hospital Stay: Payer: Medicare Other | Attending: Internal Medicine | Admitting: Internal Medicine

## 2017-04-29 DIAGNOSIS — C9002 Multiple myeloma in relapse: Secondary | ICD-10-CM

## 2017-04-29 DIAGNOSIS — R252 Cramp and spasm: Secondary | ICD-10-CM

## 2017-04-29 DIAGNOSIS — M899 Disorder of bone, unspecified: Secondary | ICD-10-CM | POA: Diagnosis not present

## 2017-04-29 DIAGNOSIS — Z5111 Encounter for antineoplastic chemotherapy: Secondary | ICD-10-CM | POA: Insufficient documentation

## 2017-04-29 DIAGNOSIS — Z79899 Other long term (current) drug therapy: Secondary | ICD-10-CM | POA: Diagnosis not present

## 2017-04-29 DIAGNOSIS — R809 Proteinuria, unspecified: Secondary | ICD-10-CM | POA: Insufficient documentation

## 2017-04-29 DIAGNOSIS — N179 Acute kidney failure, unspecified: Secondary | ICD-10-CM

## 2017-04-29 DIAGNOSIS — R21 Rash and other nonspecific skin eruption: Secondary | ICD-10-CM | POA: Insufficient documentation

## 2017-04-29 DIAGNOSIS — F17210 Nicotine dependence, cigarettes, uncomplicated: Secondary | ICD-10-CM

## 2017-04-29 DIAGNOSIS — D631 Anemia in chronic kidney disease: Secondary | ICD-10-CM | POA: Insufficient documentation

## 2017-04-29 DIAGNOSIS — E876 Hypokalemia: Secondary | ICD-10-CM

## 2017-04-29 LAB — COMPREHENSIVE METABOLIC PANEL
ALBUMIN: 3.5 g/dL (ref 3.5–5.0)
ALK PHOS: 65 U/L (ref 38–126)
ALT: 31 U/L (ref 14–54)
AST: 38 U/L (ref 15–41)
Anion gap: 9 (ref 5–15)
BILIRUBIN TOTAL: 0.4 mg/dL (ref 0.3–1.2)
BUN: 16 mg/dL (ref 6–20)
CALCIUM: 8.9 mg/dL (ref 8.9–10.3)
CO2: 25 mmol/L (ref 22–32)
Chloride: 103 mmol/L (ref 101–111)
Creatinine, Ser: 1.07 mg/dL — ABNORMAL HIGH (ref 0.44–1.00)
GFR calc Af Amer: >60 mL/min (ref >60)
GFR calc non Af Amer: 53 mL/min — ABNORMAL LOW (ref >60)
GLUCOSE: 75 mg/dL (ref 65–99)
Potassium: 3.5 mmol/L (ref 3.5–5.1)
Sodium: 137 mmol/L (ref 135–145)
TOTAL PROTEIN: 9.1 g/dL — AB (ref 6.5–8.1)

## 2017-04-29 LAB — CBC WITH DIFFERENTIAL/PLATELET
BASOS ABS: 0.1 10*3/uL (ref 0–0.1)
BASOS PCT: 1 %
EOS PCT: 1 %
Eosinophils Absolute: 0 10*3/uL (ref 0–0.7)
HEMATOCRIT: 26.2 % — AB (ref 35.0–47.0)
HEMOGLOBIN: 9 g/dL — AB (ref 12.0–16.0)
LYMPHS PCT: 31 %
Lymphs Abs: 2 10*3/uL (ref 1.0–3.6)
MCH: 31.7 pg (ref 26.0–34.0)
MCHC: 34.3 g/dL (ref 32.0–36.0)
MCV: 92.4 fL (ref 80.0–100.0)
MONO ABS: 0.3 10*3/uL (ref 0.2–0.9)
Monocytes Relative: 5 %
Neutro Abs: 4 10*3/uL (ref 1.4–6.5)
Neutrophils Relative %: 62 %
Platelets: 267 10*3/uL (ref 150–440)
RBC: 2.83 MIL/uL — AB (ref 3.80–5.20)
RDW: 16.1 % — ABNORMAL HIGH (ref 11.5–14.5)
WBC: 6.4 10*3/uL (ref 3.6–11.0)

## 2017-04-29 MED ORDER — BORTEZOMIB CHEMO SQ INJECTION 3.5 MG (2.5MG/ML)
1.3000 mg/m2 | Freq: Once | INTRAMUSCULAR | Status: AC
  Administered 2017-04-29: 2.25 mg via SUBCUTANEOUS
  Filled 2017-04-29: qty 2.25

## 2017-04-29 MED ORDER — PROCHLORPERAZINE MALEATE 10 MG PO TABS
10.0000 mg | ORAL_TABLET | Freq: Once | ORAL | Status: AC
  Administered 2017-04-29: 10 mg via ORAL
  Filled 2017-04-29: qty 1

## 2017-04-29 NOTE — Addendum Note (Signed)
Addended by: Sandria Bales B on: 04/29/2017 04:00 PM   Modules accepted: Orders

## 2017-04-29 NOTE — Assessment & Plan Note (Addendum)
#  Multiple myeloma-stage III. New diagnosis IgA lambda [sep 0786]; cytogenetics/fish- 11:14-HIGH RISK]  Currently s/p Velcade subcutaneous twice weekly/dexamethasone 40 mg weekly- significant improvement of the renal function [C discussion below].  Awaiting on the myeloma numbers from today.  # START Rev ['10mg'$  PO days 1-14]- valcade [weekly]. I would recommend RVD induction treatment 4-6 cycles; autologous symptoms of transplant followed by maintenance treatment. Patient tolerating well- we will go up on the dose of Revlimid.  # Given the renal dysfunction would plan to start at 10 mg - 2 weeks on; one week off. Discussed the risk of DVTs/PE- recommend adding aspirin 325 mg a day. Again reiterated side effects of Revlimid.  # Acute renal failure [creat 7.5 at presentation]- secondary to multiple myeloma; Proteinuria [1.4 g/day]. creatinine 1.07/GFR>60- today.   # Severe hypokalemia potassium- improved. Potassium 3.5; recommend potassium supplementation 20 meQ /day  # Skeletal lesion- recommend X [place]; at next visit.   # Infectious prophylaxis- continue acyclovir.   # lab/weekly velaced; MD/labs/2 weeks [order rev]. Make referral to [facility] team.

## 2017-04-29 NOTE — Progress Notes (Signed)
Patient is here today for a follow up.  Patient states she has been feeling well, but her only concern is that she has been having "cramps and spasms" in her right hand, and forearm.

## 2017-04-29 NOTE — Progress Notes (Signed)
[place] NOTE  Patient Care Team: Center, [facility] as PCP - General (General Practice)  CHIEF COMPLAINTS/PURPOSE OF CONSULTATION: Multiple myeloma Multiple myeloma.  #  Oncology History   # SEP 2018- MULTIPLE MYELOMA IgALamda [2.5 gm/dl; K/L= 88/1298]; STAGE III [beta 2 microglobulin=5.5] [presented with acute renal failure; anemia; NO hypercalcemia; Skeletal survey-Normal]; BMBx- 45% plasma cells; FISH-POSITIVE 11:14 translocation.[facility] RISK]/cyto-Normal; SEP 2018- PET- L3 posterior element lesion.   # 9/14- velcade SQ twice weekly/Dex 40 mg/week; OCT 5th 2018-Start RVD   # Acute renal failure [Dr.Singh; Proteinuria 1.5gm/day ]; acyclovir/Asprin     Multiple myeloma in relapse (HCC)     HISTORY OF PRESENTING ILLNESS:  Brandi [name] 66 y.o.  female recently diagnosed multiple myeloma- currently on Velcade and dexamethasone- Is here for follow-up.  Patient states Brandi appetite is good. She admits to mild cramping in Brandi hands and legs. Denies any nausea vomiting. She has been urinating well. Otherwise denies any diarrhea. Denies any tingling or numbness. No nausea no vomiting.  Otherwise no chest pain or shortness of breath. Denies any significant tingling and numbness.  ROS: A complete 10 point review of system is done which is negative except mentioned above in history of present illness  MEDICAL HISTORY:  No past medical history on file.  SURGICAL HISTORY: No past surgical history on file.  SOCIAL HISTORY:  She lives in [place] with Brandi son. Brandi [name] lives close by. She smokes about 4-5 cigarettes a day. No alcohol. She used to work in a [employer]. Currently retired.  Social History   Social History  . Marital status: Single    Spouse name: N/A  . Number of children: N/A  . Years of education: N/A   Occupational History  . Not on file.   Social History Main Topics  . Smoking status: Current Every Day Smoker    Packs/day:  0.50    Types: Cigarettes  . Smokeless tobacco: Never Used  . Alcohol use No  . Drug use: No  . Sexual activity: Not on file   Other Topics Concern  . Not on file   Social History Narrative   Lives at home with family. Independent at baseline    FAMILY HISTORY: Family History  Problem Relation Age of Onset  . Pneumonia Mother   . Seizures Father     ALLERGIES:  has No Known Allergies.  MEDICATIONS:  Current Outpatient Prescriptions  Medication Sig Dispense Refill  . acetaminophen (TYLENOL) 325 MG tablet Take 1 tablet (325 mg total) by mouth every 6 (six) hours as needed for mild pain (or Fever >/= 101).    [name] acyclovir (ZOVIRAX) 400 MG tablet One pill a day [to prevent shingles] 30 tablet 3  . amLODipine (NORVASC) 10 MG tablet Take 1 tablet (10 mg total) by mouth daily. 30 tablet 0  . dexamethasone (DECADRON) 4 MG tablet Take 10 tablets (40 mg total) by mouth once a week. 10 pills in AM with food. 40 tablet 4  . dronabinol (MARINOL) 5 MG capsule Take 1 capsule (5 mg total) by mouth 2 (two) times daily before lunch and supper. 60 capsule 3  . feeding supplement, ENSURE ENLIVE, (ENSURE ENLIVE) LIQD Take 237 mLs by mouth 2 (two) times daily between meals. 60 Bottle 0  . pantoprazole (PROTONIX) 40 MG tablet Take 1 tablet (40 mg total) by mouth daily.    . polyethylene glycol (MIRALAX / GLYCOLAX) packet Take 17 g by mouth daily as needed for  mild constipation. 14 each 0  . potassium chloride SA (K-DUR,KLOR-CON) 20 MEQ tablet Take 1 tablet (20 mEq total) by mouth 2 (two) times daily. 30 tablet 0  . temazepam (RESTORIL) 7.5 MG capsule Take 1 capsule (7.5 mg total) by mouth at bedtime. 30 capsule 0  . vitamin B-12 100 MCG tablet Take 1 tablet (100 mcg total) by mouth daily. 30 tablet 0  . lenalidomide (REVLIMID) 10 MG capsule Take 1 capsule (10 mg total) by mouth daily. 2 weeks ON and 1 week OFF (Patient not taking: Reported on 04/29/2017) 14 capsule 3   No current facility-administered  medications for this visit.       [name]  PHYSICAL EXAMINATION: ECOG PERFORMANCE STATUS: 1 - Symptomatic but completely ambulatory  Vitals:   04/29/17 1031  BP: 137/79  Pulse: 66  Resp: 16  Temp: 98.1 F (36.7 C)   Filed Weights   04/29/17 1031  Weight: 143 lb (64.9 kg)    GENERAL: Well-nourished well-developed; Alert, no distress and comfortable.   Accompanied by Brandi [name]. EYES: no pallor or icterus OROPHARYNX: no thrush or ulceration; good dentition  NECK: supple, no masses felt LYMPH:  no palpable lymphadenopathy in the cervical, axillary or inguinal regions LUNGS: clear to auscultation and  No wheeze or crackles HEART/CVS: regular rate & rhythm and no murmurs; No lower extremity edema ABDOMEN: abdomen soft, non-tender and normal bowel sounds Musculoskeletal:no cyanosis of digits and no clubbing  PSYCH: alert & oriented x 3 with fluent speech NEURO: no focal motor/sensory deficits SKIN:  no rashes or significant lesions  LABORATORY DATA:  I have reviewed the data as listed Lab Results  Component Value Date   WBC 6.4 04/29/2017   HGB 9.0 (L) 04/29/2017   HCT 26.2 (L) 04/29/2017   MCV 92.4 04/29/2017   PLT 267 04/29/2017    Recent Labs  04/05/17 1813  04/07/17 0533  04/15/17 1014 04/22/17 1415 04/29/17 1016  NA 138  < > 142  < > 139 139 137  K 4.0  < > 4.2  < > 3.4* 2.9* 3.5  CL 102  < > 112*  < > 102 103 103  CO2 21*  < > 21*  < > _0 GLUCOSE 107*  < > 91  < > 102* 82 75  BUN 55*  < > 41*  < > _1 CREATININE 7.24*  < > 6.30*  < > 1.75* 1.38* 1.07*  CALCIUM 9.5  < > 8.5*  < > 8.2* 8.6* 8.9  GFRNONAA 5*  < > 6*  < > 29* 39* 53*  GFRAA 6*  < > 7*  < > 34* 45* >60  PROT 10.1*  --   --   --  8.5*  --  9.1*  ALBUMIN 3.7  --  2.7*  --  3.1*  --  3.5  AST 22  --   --   --  42*  --  38  ALT 9*  --   --   --  74*  --  31  ALKPHOS 48  --   --   --  80  --  65  BILITOT 0.6  --   --   --  0.4  --  0.4  < > = values in this interval not  displayed.  RADIOGRAPHIC STUDIES: I have personally reviewed the radiological images as listed and agreed with the findings in the report. Ct Abdomen Pelvis Wo Contrast  Result  Date: 04/05/2017 CLINICAL DATA:  Pulsatile abdominal mass. Suspect abdominal aortic aneurysm EXAM: CT ABDOMEN AND PELVIS WITHOUT CONTRAST TECHNIQUE: Multidetector CT imaging of the abdomen and pelvis was performed following the standard protocol without IV contrast. COMPARISON:  None. FINDINGS: Lower chest: Lung bases clear Hepatobiliary: Small calcified hepatic granuloma. No liver mass. Gallbladder and bile ducts normal Pancreas: Negative Spleen: Negative Adrenals/Urinary Tract: No renal mass or obstruction or stone. Urinary bladder normal Stomach/Bowel: Negative for bowel obstruction. No bowel mass or edema. Normal appendix. Vascular/Lymphatic: Atherosclerotic aorta and iliac arteries. Negative for aortic aneurysm. No retroperitoneal mass Reproductive: Small uterine fibroids. Other: No free-fluid Musculoskeletal: Lumbar degenerative changes without acute skeletal abnormality. IMPRESSION: Atherosclerotic aorta without aneurysm. No acute abnormality. Electronically Signed   By: Marlan Palau M.D.   On: 04/05/2017 19:37   Dg Chest 2 View  Result Date: 04/05/2017 CLINICAL DATA:  Cough and wheezing for 1 week EXAM: CHEST  2 VIEW COMPARISON:  None. FINDINGS: Cardiac shadow is within normal limits. Mild aortic calcifications are seen. Lungs are well aerated bilaterally with mild interstitial changes. No focal confluent infiltrate is seen. No sizable effusion is noted. Mild degenerative changes of the thoracic spine are noted. IMPRESSION: Mild interstitial changes which may be chronic in nature. A mild degree of acute bronchitis could not be totally excluded. Electronically Signed   By: Alcide Clever M.D.   On: 04/05/2017 18:44   US Renal  Result Date: 04/05/2017 CLINICAL DATA:  Acute kidney injury. EXAM: RENAL / URINARY TRACT  ULTRASOUND COMPLETE COMPARISON:  04/05/2017 FINDINGS: Right Kidney: Length: 13.4 cm. Increased cortical echogenicity. No mass or hydronephrosis visualized. Left Kidney: Length: 13.8 cm. Increased cortical echogenicity. No mass or hydronephrosis visualized. Bladder: Appears normal for degree of bladder distention. IMPRESSION: 1. No hydronephrosis. 2. Bilateral echogenic kidneys compatible with medical renal disease. Electronically Signed   By: Signa Kell M.D.   On: 04/05/2017 20:43   Nm Pet Image Initial (pi) Whole Body  Result Date: 04/21/2017 CLINICAL DATA:  Initial treatment strategy for multiple myeloma. EXAM: NUCLEAR MEDICINE PET WHOLE BODY TECHNIQUE: 13.0 mCi F-18 FDG was injected intravenously. Full-ring PET imaging was performed from the vertex to the feet after the radiotracer. CT data was obtained and used for attenuation correction and anatomic localization. FASTING BLOOD GLUCOSE:  Value: 78 mg/dl COMPARISON:  None. FINDINGS: HEAD/NECK No hypermetabolic activity in the scalp. No hypermetabolic cervical lymph nodes. CHEST No hypermetabolic mediastinal or hilar nodes. No suspicious pulmonary nodules on the CT scan. ABDOMEN/PELVIS There is a focus of uptake in the skin and subcutaneous tissues of the right pelvis, likely an injection site. Recommend clinical correlation to exclude a skin lesion. No other abnormal uptake in the abdomen or pelvis soft tissues. SKELETON There is mild uptake posteriorly in the right iliac bone at the site of recent bone marrow biopsy, likely reactive. There is an FDG avid lytic lesion in the right posterior element of L3 on series 3, image 180 with a maximum SUV of 6.2. There is mild increased uptake in the anterior inferior aspect of L5 without CT correlate on image 210 with a maximum SUV of 3.9. No other abnormal bony uptake identified. EXTREMITIES No abnormal hypermetabolic activity in the lower extremities. IMPRESSION: 1. There is an FDG avid lytic lesion in the right  posterior element of L3 consistent with myelomatous involvement. More mild uptake without CT correlate in L5 is nonspecific. Recommend attention on follow-up. No other bony involvement identified. 2. Atherosclerotic changes seen in the aorta. Coronary  artery calcifications. 3. Nodular uptake in the skin and subcutaneous tissues of the right pelvis, likely an injection site. Recommend clinical correlation to exclude a skin lesion. Aortic Atherosclerosis (ICD10-I70.0). Electronically Signed   By: Dorise Bullion III M.D   On: 04/21/2017 15:09   Ct Biopsy  Result Date: 04/11/2017 INDICATION: 66 year old with acute renal failure and myeloma workup. EXAM: CT GUIDED BONE MARROW ASPIRATES AND BIOPSY Physician: Stephan Minister. Anselm Pancoast, MD MEDICATIONS: None. ANESTHESIA/SEDATION: Fentanyl 50 mcg IV; Versed 1.5 mg IV Moderate Sedation Time:  29 minutes The patient was continuously monitored during the procedure by the interventional radiology nurse under my direct supervision. COMPLICATIONS: None immediate. PROCEDURE: The procedure was explained to the patient. The risks and benefits of the procedure were discussed and the patient's questions were addressed. Informed consent was obtained from the patient. The patient was placed prone on CT scan. Images of the pelvis were obtained. The right side of back was prepped and draped in sterile fashion. The skin and right posterior iliac bone were anesthetized with 1% lidocaine. 11 gauge bone needle was directed into the right iliac bone with CT guidance. Two aspirates and two core biopsies obtained. Bandage placed over the puncture site. FINDINGS: Bone needle directed into the posterior right ilium. IMPRESSION: CT guided bone marrow aspirates and core biopsy. Electronically Signed   By: Markus Daft M.D.   On: 04/11/2017 12:59   Dg Bone Survey Met  Result Date: 04/08/2017 CLINICAL DATA:  Multiple myeloma. EXAM: METASTATIC BONE SURVEY COMPARISON:  Chest x-ray 04/05/2017 FINDINGS: Lateral  image of the skull shows no bone lesion. There is heterogeneous density from hair braids. AP view of the chest shows no definite rib lesion or shoulder girdle lesion. Chronic aortic tortuosity. Mild atelectasis at the right base. No edema, effusion, or pneumothorax. No evidence of cervical spine lesion. Bulky spondylotic spurring in the mid and lower cervical spine. Diffuse cervical facet arthropathy with spurring. Bilateral upper extremities showed no focal bone lesion. There is an osteopenic appearance which accounts for trabecular prominence diffusely and symmetrically in the humerus. No bony pelvis or femoral lesion is seen. There is osteopenia and atherosclerosis. No tibia or fibula lesion is noted. No visible spinal lesion. There are hypoplastic T12 ribs. There is an L2 right lamina lesion by CTA 04/05/2017. Prominent lumbar facet arthropathy with L4-5 grade 1 anterolisthesis. IMPRESSION: 1. No visible myeloma on this bone survey. In retrospect, there is a L2 right lamina lesion on 04/05/2017 abdominal CT. 2. Mild right base atelectasis, new from 3 days ago. 3. Osteopenia. Electronically Signed   By: Monte Fantasia M.D.   On: 04/08/2017 10:11   IMPRESSION: 1. There is an FDG avid lytic lesion in the right posterior element of L3 consistent with myelomatous involvement. More mild uptake without CT correlate in L5 is nonspecific. Recommend attention on follow-up. No other bony involvement identified. 2. Atherosclerotic changes seen in the aorta. Coronary artery calcifications. 3. Nodular uptake in the skin and subcutaneous tissues of the right pelvis, likely an injection site. Recommend clinical correlation to exclude a skin lesion. Aortic Atherosclerosis (ICD10-I70.0).   Electronically Signed   By: Dorise Bullion III M.D   On: 04/21/2017 15:09  ASSESSMENT & PLAN:   Multiple myeloma in relapse [facility]) # Multiple myeloma-stage III. New diagnosis IgA lambda [sep 8119]; cytogenetics/fish-  11:14-HIGH RISK]  Currently s/p Velcade subcutaneous twice weekly/dexamethasone 40 mg weekly- significant improvement of the renal function [C discussion below].  Awaiting on the myeloma numbers from today.  #  START Rev [37m PO days 1-14]- valcade [weekly]. I would recommend RVD induction treatment 4-6 cycles; autologous symptoms of transplant followed by maintenance treatment. Patient tolerating well- we will go up on the dose of Revlimid.  # Given the renal dysfunction would plan to start at 10 mg - 2 weeks on; one week off. Discussed the risk of DVTs/PE- recommend adding aspirin 325 mg a day. Again reiterated side effects of Revlimid.  # Acute renal failure [creat 7.5 at presentation]- secondary to multiple myeloma; Proteinuria [1.4 g/day]. creatinine 1.07/GFR>60- today.   # Severe hypokalemia potassium- improved. Potassium 3.5; recommend potassium supplementation 20 meQ /day  # Skeletal lesion- recommend X [place]; at next visit.   # Infectious prophylaxis- continue acyclovir.   # lab/weekly velaced; MD/labs/2 weeks [order rev]. [facility]  All questions were answered. The patient knows to call the clinic with any problems, questions or concerns.    GCammie Sickle MD 04/29/2017 1:17 PM

## 2017-05-02 ENCOUNTER — Inpatient Hospital Stay: Payer: Medicare Other

## 2017-05-02 LAB — KAPPA/LAMBDA LIGHT CHAINS
KAPPA, LAMDA LIGHT CHAIN RATIO: 0.07 — AB (ref 0.26–1.65)
Kappa free light chain: 56.9 mg/L — ABNORMAL HIGH (ref 3.3–19.4)
LAMDA FREE LIGHT CHAINS: 828.9 mg/L — AB (ref 5.7–26.3)

## 2017-05-02 LAB — CHROMOSOME ANALYSIS, BONE MARROW

## 2017-05-03 LAB — MULTIPLE MYELOMA PANEL, SERUM
ALBUMIN/GLOB SERPL: 0.7 (ref 0.7–1.7)
ALPHA 1: 0.2 g/dL (ref 0.0–0.4)
Albumin SerPl Elph-Mcnc: 3.3 g/dL (ref 2.9–4.4)
Alpha2 Glob SerPl Elph-Mcnc: 0.8 g/dL (ref 0.4–1.0)
B-Globulin SerPl Elph-Mcnc: 3.8 g/dL — ABNORMAL HIGH (ref 0.7–1.3)
GAMMA GLOB SERPL ELPH-MCNC: 0.7 g/dL (ref 0.4–1.8)
GLOBULIN, TOTAL: 5.5 g/dL — AB (ref 2.2–3.9)
IGA: 1974 mg/dL — AB (ref 87–352)
IgG (Immunoglobin G), Serum: 737 mg/dL (ref 700–1600)
IgM (Immunoglobulin M), Srm: 10 mg/dL — ABNORMAL LOW (ref 26–217)
M Protein SerPl Elph-Mcnc: 3.2 g/dL — ABNORMAL HIGH
Total Protein ELP: 8.8 g/dL — ABNORMAL HIGH (ref 6.0–8.5)

## 2017-05-06 ENCOUNTER — Inpatient Hospital Stay: Payer: Medicare Other

## 2017-05-06 VITALS — BP 125/76 | HR 79 | Temp 97.8°F | Resp 18

## 2017-05-06 DIAGNOSIS — R809 Proteinuria, unspecified: Secondary | ICD-10-CM | POA: Diagnosis not present

## 2017-05-06 DIAGNOSIS — C9002 Multiple myeloma in relapse: Secondary | ICD-10-CM

## 2017-05-06 DIAGNOSIS — D631 Anemia in chronic kidney disease: Secondary | ICD-10-CM | POA: Diagnosis not present

## 2017-05-06 DIAGNOSIS — N179 Acute kidney failure, unspecified: Secondary | ICD-10-CM | POA: Diagnosis not present

## 2017-05-06 DIAGNOSIS — Z5111 Encounter for antineoplastic chemotherapy: Secondary | ICD-10-CM | POA: Diagnosis not present

## 2017-05-06 DIAGNOSIS — E876 Hypokalemia: Secondary | ICD-10-CM | POA: Diagnosis not present

## 2017-05-06 LAB — CBC WITH DIFFERENTIAL/PLATELET
BASOS PCT: 0 %
Basophils Absolute: 0 10*3/uL (ref 0–0.1)
EOS ABS: 0.1 10*3/uL (ref 0–0.7)
Eosinophils Relative: 2 %
HEMATOCRIT: 26.9 % — AB (ref 35.0–47.0)
HEMOGLOBIN: 9.2 g/dL — AB (ref 12.0–16.0)
LYMPHS ABS: 1.6 10*3/uL (ref 1.0–3.6)
Lymphocytes Relative: 26 %
MCH: 31.9 pg (ref 26.0–34.0)
MCHC: 34 g/dL (ref 32.0–36.0)
MCV: 93.9 fL (ref 80.0–100.0)
MONO ABS: 0.2 10*3/uL (ref 0.2–0.9)
MONOS PCT: 3 %
NEUTROS PCT: 69 %
Neutro Abs: 4.2 10*3/uL (ref 1.4–6.5)
Platelets: 189 10*3/uL (ref 150–440)
RBC: 2.87 MIL/uL — ABNORMAL LOW (ref 3.80–5.20)
RDW: 15.9 % — AB (ref 11.5–14.5)
WBC: 6.2 10*3/uL (ref 3.6–11.0)

## 2017-05-06 LAB — BASIC METABOLIC PANEL
Anion gap: 12 (ref 5–15)
BUN: 11 mg/dL (ref 6–20)
CALCIUM: 8.8 mg/dL — AB (ref 8.9–10.3)
CHLORIDE: 101 mmol/L (ref 101–111)
CO2: 24 mmol/L (ref 22–32)
CREATININE: 1 mg/dL (ref 0.44–1.00)
GFR calc non Af Amer: 58 mL/min — ABNORMAL LOW (ref >60)
Glucose, Bld: 100 mg/dL — ABNORMAL HIGH (ref 65–99)
Potassium: 3.4 mmol/L — ABNORMAL LOW (ref 3.5–5.1)
SODIUM: 137 mmol/L (ref 135–145)

## 2017-05-06 MED ORDER — BORTEZOMIB CHEMO SQ INJECTION 3.5 MG (2.5MG/ML)
1.3000 mg/m2 | Freq: Once | INTRAMUSCULAR | Status: AC
  Administered 2017-05-06: 2.25 mg via SUBCUTANEOUS
  Filled 2017-05-06: qty 2.25

## 2017-05-06 MED ORDER — PROCHLORPERAZINE MALEATE 10 MG PO TABS
10.0000 mg | ORAL_TABLET | Freq: Once | ORAL | Status: AC
  Administered 2017-05-06: 10 mg via ORAL
  Filled 2017-05-06: qty 1

## 2017-05-08 ENCOUNTER — Other Ambulatory Visit: Payer: Self-pay | Admitting: Internal Medicine

## 2017-05-09 NOTE — Telephone Encounter (Signed)
MD would like RN to hold off renewing the Revlimid at this time until the pt is evaluated in the clinic this week.   MD may increase the dosing of the Revlimid if tolerating oral chemo.

## 2017-05-11 ENCOUNTER — Other Ambulatory Visit: Payer: Self-pay | Admitting: *Deleted

## 2017-05-11 DIAGNOSIS — C9002 Multiple myeloma in relapse: Secondary | ICD-10-CM

## 2017-05-12 ENCOUNTER — Inpatient Hospital Stay (HOSPITAL_BASED_OUTPATIENT_CLINIC_OR_DEPARTMENT_OTHER): Payer: Medicare Other | Admitting: Internal Medicine

## 2017-05-12 ENCOUNTER — Encounter (INDEPENDENT_AMBULATORY_CARE_PROVIDER_SITE_OTHER): Payer: Self-pay

## 2017-05-12 ENCOUNTER — Inpatient Hospital Stay: Payer: Medicare Other

## 2017-05-12 ENCOUNTER — Other Ambulatory Visit: Payer: Self-pay

## 2017-05-12 VITALS — BP 139/87 | HR 64 | Temp 97.4°F | Resp 14 | Wt 144.6 lb

## 2017-05-12 DIAGNOSIS — R21 Rash and other nonspecific skin eruption: Secondary | ICD-10-CM | POA: Diagnosis not present

## 2017-05-12 DIAGNOSIS — R809 Proteinuria, unspecified: Secondary | ICD-10-CM | POA: Diagnosis not present

## 2017-05-12 DIAGNOSIS — F17210 Nicotine dependence, cigarettes, uncomplicated: Secondary | ICD-10-CM

## 2017-05-12 DIAGNOSIS — Z79899 Other long term (current) drug therapy: Secondary | ICD-10-CM | POA: Diagnosis not present

## 2017-05-12 DIAGNOSIS — C9002 Multiple myeloma in relapse: Secondary | ICD-10-CM | POA: Diagnosis not present

## 2017-05-12 DIAGNOSIS — E876 Hypokalemia: Secondary | ICD-10-CM

## 2017-05-12 DIAGNOSIS — Z5111 Encounter for antineoplastic chemotherapy: Secondary | ICD-10-CM | POA: Diagnosis not present

## 2017-05-12 DIAGNOSIS — N179 Acute kidney failure, unspecified: Secondary | ICD-10-CM | POA: Diagnosis not present

## 2017-05-12 DIAGNOSIS — D631 Anemia in chronic kidney disease: Secondary | ICD-10-CM

## 2017-05-12 LAB — COMPREHENSIVE METABOLIC PANEL
ALT: 78 U/L — AB (ref 14–54)
AST: 85 U/L — ABNORMAL HIGH (ref 15–41)
Albumin: 3.5 g/dL (ref 3.5–5.0)
Alkaline Phosphatase: 83 U/L (ref 38–126)
Anion gap: 13 (ref 5–15)
BILIRUBIN TOTAL: 0.6 mg/dL (ref 0.3–1.2)
BUN: 14 mg/dL (ref 6–20)
CALCIUM: 8.8 mg/dL — AB (ref 8.9–10.3)
CHLORIDE: 100 mmol/L — AB (ref 101–111)
CO2: 22 mmol/L (ref 22–32)
CREATININE: 0.97 mg/dL (ref 0.44–1.00)
GFR, EST NON AFRICAN AMERICAN: 60 mL/min — AB (ref >60)
Glucose, Bld: 93 mg/dL (ref 65–99)
Potassium: 3.4 mmol/L — ABNORMAL LOW (ref 3.5–5.1)
Sodium: 135 mmol/L (ref 135–145)
TOTAL PROTEIN: 8.7 g/dL — AB (ref 6.5–8.1)

## 2017-05-12 LAB — CBC WITH DIFFERENTIAL/PLATELET
Basophils Absolute: 0 10*3/uL (ref 0–0.1)
Basophils Relative: 0 %
EOS PCT: 3 %
Eosinophils Absolute: 0.2 10*3/uL (ref 0–0.7)
HEMATOCRIT: 25.9 % — AB (ref 35.0–47.0)
Hemoglobin: 8.8 g/dL — ABNORMAL LOW (ref 12.0–16.0)
LYMPHS ABS: 2.5 10*3/uL (ref 1.0–3.6)
LYMPHS PCT: 35 %
MCH: 32.3 pg (ref 26.0–34.0)
MCHC: 34.2 g/dL (ref 32.0–36.0)
MCV: 94.4 fL (ref 80.0–100.0)
MONO ABS: 0.4 10*3/uL (ref 0.2–0.9)
Monocytes Relative: 6 %
NEUTROS ABS: 4 10*3/uL (ref 1.4–6.5)
Neutrophils Relative %: 56 %
Platelets: 159 10*3/uL (ref 150–440)
RBC: 2.74 MIL/uL — AB (ref 3.80–5.20)
RDW: 16.1 % — ABNORMAL HIGH (ref 11.5–14.5)
WBC: 7.2 10*3/uL (ref 3.6–11.0)

## 2017-05-12 LAB — SAMPLE TO BLOOD BANK

## 2017-05-12 MED ORDER — LENALIDOMIDE 20 MG PO CAPS: 20.0000 mg | ORAL_CAPSULE | Freq: Every day | ORAL | 3 refills | Status: DC

## 2017-05-12 MED ORDER — DRONABINOL 5 MG PO CAPS: 5.0000 mg | ORAL_CAPSULE | Freq: Two times a day (BID) | ORAL | 3 refills | Status: DC

## 2017-05-12 MED ORDER — ACYCLOVIR 400 MG PO TABS: ORAL_TABLET | ORAL | 3 refills | Status: DC

## 2017-05-12 MED ORDER — PROCHLORPERAZINE MALEATE 10 MG PO TABS
10.0000 mg | ORAL_TABLET | Freq: Once | ORAL | Status: AC
  Administered 2017-05-12: 10 mg via ORAL
  Filled 2017-05-12: qty 1

## 2017-05-12 MED ORDER — BORTEZOMIB CHEMO SQ INJECTION 3.5 MG (2.5MG/ML)
1.3000 mg/m2 | Freq: Once | INTRAMUSCULAR | Status: AC
  Administered 2017-05-12: 2.25 mg via SUBCUTANEOUS
  Filled 2017-05-12: qty 2.25

## 2017-05-12 MED ORDER — POTASSIUM CHLORIDE CRYS ER 20 MEQ PO TBCR: 20.0000 meq | EXTENDED_RELEASE_TABLET | Freq: Two times a day (BID) | ORAL | 0 refills | Status: DC

## 2017-05-12 MED ORDER — AMLODIPINE BESYLATE 10 MG PO TABS: 10.0000 mg | ORAL_TABLET | Freq: Every day | ORAL | 0 refills | Status: DC

## 2017-05-12 MED ORDER — PANTOPRAZOLE SODIUM 40 MG PO TBEC: 40.0000 mg | DELAYED_RELEASE_TABLET | Freq: Every day | ORAL | Status: DC

## 2017-05-12 MED ORDER — DENOSUMAB 120 MG/1.7ML ~~LOC~~ SOLN
120.0000 mg | Freq: Once | SUBCUTANEOUS | Status: AC
  Administered 2017-05-12: 120 mg via SUBCUTANEOUS
  Filled 2017-05-12: qty 1.7

## 2017-05-12 NOTE — Patient Instructions (Signed)
#   Take Benadryl 25 mg up to 3 a day as needed for the skin rash # Try hydrocortisone ointment up to twice a day on the rash # Also take calcium and vitamin D [total of thousand milligrams each] once a day

## 2017-05-12 NOTE — Progress Notes (Signed)
[name] NOTE  Patient Care Team: Center, [facility] as PCP - General (General Practice)  CHIEF COMPLAINTS/PURPOSE OF CONSULTATION: Multiple myeloma Multiple myeloma.  #  Oncology History   # SEP 2018- MULTIPLE MYELOMA IgALamda [2.5 gm/dl; K/L= 88/1298]; STAGE III [beta 2 microglobulin=5.5] [presented with acute renal failure; anemia; NO hypercalcemia; Skeletal survey-Normal]; BMBx- 45% plasma cells; FISH-POSITIVE 11:14 translocation.[facility] RISK]/cyto-Normal; SEP 2018- PET- L3 posterior element lesion.   # 9/14- velcade SQ twice weekly/Dex 40 mg/week; OCT 5th 2018-Start RVD   # Acute renal failure [Dr.Singh; Proteinuria 1.5gm/day ]; acyclovir/Asprin     Multiple myeloma in relapse (HCC)     HISTORY OF PRESENTING ILLNESS:  Brandi [name] 66 y.o.  female recently diagnosed multiple myeloma-  Currently status post cycle #1 of RVD is here for follow-up. Patient finished her day #14 of Revlimid this morning.  Patient noted to have a rash on her feet bilateral; nonitching. No rash. Medications for this. Not anywhere [place]. Patient has not taken any medications.  Her appetite is improving. Denies any significant tingling and numbness of the extremities. No nausea no vomiting. No difficult swallowing.  Otherwise no chest pain or shortness of breath.   ROS: A complete 10 point review of system is done which is negative except mentioned above in history of present illness  MEDICAL HISTORY:  No past medical history on file.  SURGICAL HISTORY: No past surgical history on file.  SOCIAL HISTORY:  She lives in [place] with her son. Her daughter lives close by. She smokes about 4-5 cigarettes a day. No alcohol. She used to work in a [employer]. Currently retired.  Social History   Social History  . Marital status: Single    Spouse name: N/A  . Number of children: N/A  . Years of education: N/A   Occupational History  . Not on file.   Social History  Main Topics  . Smoking status: Current Every Day Smoker    Packs/day: 0.50    Types: Cigarettes  . Smokeless tobacco: Never Used  . Alcohol use No  . Drug use: No  . Sexual activity: Not on file   Other Topics Concern  . Not on file   Social History Narrative   Lives at home with family. Independent at baseline    FAMILY HISTORY: Family History  Problem Relation Age of Onset  . Pneumonia Mother   . Seizures Father     ALLERGIES:  has No Known Allergies.  MEDICATIONS:  Current Outpatient Prescriptions  Medication Sig Dispense Refill  . acetaminophen (TYLENOL) 325 MG tablet Take 1 tablet (325 mg total) by mouth every 6 (six) hours as needed for mild pain (or Fever >/= 101).    [name] acyclovir (ZOVIRAX) 400 MG tablet One pill a day [to prevent shingles] 30 tablet 3  . amLODipine (NORVASC) 10 MG tablet Take 1 tablet (10 mg total) by mouth daily. 30 tablet 0  . dexamethasone (DECADRON) 4 MG tablet Take 10 tablets (40 mg total) by mouth once a week. 10 pills in AM with food. 40 tablet 4  . dronabinol (MARINOL) 5 MG capsule Take 1 capsule (5 mg total) by mouth 2 (two) times daily before lunch and supper. 60 capsule 3  . feeding supplement, ENSURE ENLIVE, (ENSURE ENLIVE) LIQD Take 237 mLs by mouth 2 (two) times daily between meals. 60 Bottle 0  . pantoprazole (PROTONIX) 40 MG tablet Take 1 tablet (40 mg total) by mouth daily.    [name]  polyethylene glycol (MIRALAX / GLYCOLAX) packet Take 17 g by mouth daily as needed for mild constipation. 14 each 0  . potassium chloride SA (K-DUR,KLOR-CON) 20 MEQ tablet Take 1 tablet (20 mEq total) by mouth 2 (two) times daily. 30 tablet 0  . temazepam (RESTORIL) 7.5 MG capsule Take 1 capsule (7.5 mg total) by mouth at bedtime. 30 capsule 0  . vitamin B-12 100 MCG tablet Take 1 tablet (100 mcg total) by mouth daily. 30 tablet 0  . lenalidomide (REVLIMID) 20 MG capsule Take 1 capsule (20 mg total) by mouth daily. 2 weeks ON and 1 week OFF 14 capsule 3   No  current facility-administered medications for this visit.       [name]  PHYSICAL EXAMINATION: ECOG PERFORMANCE STATUS: 1 - Symptomatic but completely ambulatory  Vitals:   05/12/17 1121  BP: 139/87  Pulse: 64  Resp: 14  Temp: (!) 97.4 F (36.3 C)   Filed Weights   05/12/17 1121  Weight: 144 lb 9.6 oz (65.6 kg)    GENERAL: Well-nourished well-developed; Alert, no distress and comfortable.   Accompanied by her daughter. EYES: no pallor or icterus OROPHARYNX: no thrush or ulceration; good dentition  NECK: supple, no masses felt LYMPH:  no palpable lymphadenopathy in the cervical, axillary or inguinal regions LUNGS: clear to auscultation and  No wheeze or crackles HEART/CVS: regular rate & rhythm and no murmurs; No lower extremity edema ABDOMEN: abdomen soft, non-tender and normal bowel sounds Musculoskeletal:no cyanosis of digits and no clubbing  PSYCH: alert & oriented x 3 with fluent speech NEURO: no focal motor/sensory deficits SKIN:  no rashes or significant lesions  LABORATORY DATA:  I have reviewed the data as listed Lab Results  Component Value Date   WBC 7.2 05/12/2017   HGB 8.8 (L) 05/12/2017   HCT 25.9 (L) 05/12/2017   MCV 94.4 05/12/2017   PLT 159 05/12/2017    Recent Labs  04/15/17 1014  04/29/17 1016 05/06/17 0840 05/12/17 1048  NA 139  < > 137 137 135  K 3.4*  < > 3.5 3.4* 3.4*  CL 102  < > 103 101 100*  CO2 26  < > '25 24 22  ' GLUCOSE 102*  < > 75 100* 93  BUN 18  < > '16 11 14  ' CREATININE 1.75*  < > 1.07* 1.00 0.97  CALCIUM 8.2*  < > 8.9 8.8* 8.8*  GFRNONAA 29*  < > 53* 58* 60*  GFRAA 34*  < > >60 >60 >60  PROT 8.5*  --  9.1*  --  8.7*  ALBUMIN 3.1*  --  3.5  --  3.5  AST 42*  --  38  --  85*  ALT 74*  --  31  --  78*  ALKPHOS 80  --  65  --  83  BILITOT 0.4  --  0.4  --  0.6  < > = values in this interval not displayed.  RADIOGRAPHIC STUDIES: I have personally reviewed the radiological images as listed and agreed with the findings in the  report. Nm Pet Image Initial (pi) Whole Body  Result Date: 04/21/2017 CLINICAL DATA:  Initial treatment strategy for multiple myeloma. EXAM: NUCLEAR MEDICINE PET WHOLE BODY TECHNIQUE: 13.0 mCi F-18 FDG was injected intravenously. Full-ring PET imaging was performed from the vertex to the feet after the radiotracer. CT data was obtained and used for attenuation correction and anatomic localization. FASTING BLOOD GLUCOSE:  Value: 78 mg/dl COMPARISON:  None. FINDINGS: HEAD/NECK  No hypermetabolic activity in the scalp. No hypermetabolic cervical lymph nodes. CHEST No hypermetabolic mediastinal or hilar nodes. No suspicious pulmonary nodules on the CT scan. ABDOMEN/PELVIS There is a focus of uptake in the skin and subcutaneous tissues of the right pelvis, likely an injection site. Recommend clinical correlation to exclude a skin lesion. No other abnormal uptake in the abdomen or pelvis soft tissues. SKELETON There is mild uptake posteriorly in the right iliac bone at the site of recent bone marrow biopsy, likely reactive. There is an FDG avid lytic lesion in the right posterior element of L3 on series 3, image 180 with a maximum SUV of 6.2. There is mild increased uptake in the anterior inferior aspect of L5 without CT correlate on image 210 with a maximum SUV of 3.9. No other abnormal bony uptake identified. EXTREMITIES No abnormal hypermetabolic activity in the lower extremities. IMPRESSION: 1. There is an FDG avid lytic lesion in the right posterior element of L3 consistent with myelomatous involvement. More mild uptake without CT correlate in L5 is nonspecific. Recommend attention on follow-up. No other bony involvement identified. 2. Atherosclerotic changes seen in the aorta. Coronary artery calcifications. 3. Nodular uptake in the skin and subcutaneous tissues of the right pelvis, likely an injection site. Recommend clinical correlation to exclude a skin lesion. Aortic Atherosclerosis (ICD10-I70.0).  Electronically Signed   By: Dorise Bullion III M.D   On: 04/21/2017 15:09   IMPRESSION: 1. There is an FDG avid lytic lesion in the right posterior element of L3 consistent with myelomatous involvement. More mild uptake without CT correlate in L5 is nonspecific. Recommend attention on follow-up. No other bony involvement identified. 2. Atherosclerotic changes seen in the aorta. Coronary artery calcifications. 3. Nodular uptake in the skin and subcutaneous tissues of the right pelvis, likely an injection site. Recommend clinical correlation to exclude a skin lesion. Aortic Atherosclerosis (ICD10-I70.0).   Electronically Signed   By: Dorise Bullion III M.D   On: 04/21/2017 15:09  -------------------------- 05/12/2017          ASSESSMENT & PLAN:   Multiple myeloma in relapse ([place]) # Multiple myeloma-stage III. New diagnosis IgA lambda [sep 2202]; cytogenetics/fish- 11:14-HIGH RISK]; currently status post cycle #1 of RVD.  Lambda light chains improved; however M protein- slightly up [2.5 g-to 3.2 g]; however significant improvement of the renal function.  # Patient seems to be tolerating treatment well- except for skin rash  [C discussion below].  # Proceed with Velcade today/and on weekly basis. No evidence of neuropathy noted. Will add cycle #2 Revlimid 20 mg 2 weeks on one week off; plan to start in one week.  # skin rash bil LE non-itchy- likely sec to revlimid. Recommend benadryl 25 mg prn/hydrocortisone topical prn.   # Acute renal failure [creat 7.5 at presentation]- secondary to multiple myeloma; Proteinuria [1.4 g/day]. Resolved.   # Skeletal lesion- recommend X Geva; starting today; discussed re: hypocalcemia; ONJ.  # Also take calcium and vitamin D [total of thousand milligrams each] once a day  # Infectious prophylaxis- continue acyclovir.   # lab/weekly velaced; MD/labs/2 weeks [order rev]. Awaiting appt with [facility] team. Proceed with X-geva today. MM  panel;kappa/lamda light chains in 2 weeks.   All questions were answered. The patient knows to call the clinic with any problems, questions or concerns.    [name], MD 05/12/2017 6:28 PM

## 2017-05-12 NOTE — Assessment & Plan Note (Addendum)
#  Multiple myeloma-stage III. New diagnosis IgA lambda [sep 0349]; cytogenetics/fish- 11:14-HIGH RISK]; currently status post cycle #1 of RVD.  Lambda light chains improved; however M protein- slightly up [2.5 g-to 3.2 g]; however significant improvement of the renal function.  # Patient seems to be tolerating treatment well- except for skin rash  [C discussion below].  # Proceed with Velcade today/and on weekly basis. No evidence of neuropathy noted. Will add cycle #2 Revlimid 20 mg 2 weeks on one week off; plan to start in one week.  # skin rash bil LE non-itchy- likely sec to revlimid. Recommend benadryl 25 mg prn/hydrocortisone topical prn.   # Acute renal failure [creat 7.5 at presentation]- secondary to multiple myeloma; Proteinuria [1.4 g/day]. Resolved.   # Skeletal lesion- recommend X Geva; starting today; discussed re: hypocalcemia; ONJ.  # Also take calcium and vitamin D [total of thousand milligrams each] once a day  # Infectious prophylaxis- continue acyclovir.   # lab/weekly velaced; MD/labs/2 weeks [order rev]. Awaiting appt with [facility] team. Proceed with X-geva today. MM panel;kappa/lamda light chains in 2 weeks.

## 2017-05-12 NOTE — Progress Notes (Signed)
Pt reports raised skin rash on bil. lower extremities from knees down to top of feet. Her rash is not painful to touch. She states that the rash does not itch. She noticed the rash appearing this morning- when she dressed for her doctor's apt.  On Rn assessment-there are multiple areas of flat and raised lesions on the skin on bil. Low. Ext.

## 2017-05-13 ENCOUNTER — Ambulatory Visit: Payer: Medicare Other

## 2017-05-13 ENCOUNTER — Ambulatory Visit: Payer: Medicare Other | Admitting: Oncology

## 2017-05-13 ENCOUNTER — Other Ambulatory Visit: Payer: Medicare Other

## 2017-05-18 ENCOUNTER — Other Ambulatory Visit: Payer: Self-pay | Admitting: Pharmacist

## 2017-05-18 ENCOUNTER — Other Ambulatory Visit: Payer: Self-pay | Admitting: *Deleted

## 2017-05-18 NOTE — Progress Notes (Signed)
Oral Chemotherapy Pharmacist Encounter  Received paper Rx for Revlimid refill from [place], [place]. Faxed prescription to [facility] [phone].  Revlimid take 1 capsule (20 mg total) by mouth daily. 2 weeks ON and 1 week OFF  Authorization #: [id number]  Thank you,  Darl Pikes, PharmD, BCPS Hematology/Oncology Clinical Pharmacist [facility] [phone]  05/18/2017 12:36 PM

## 2017-05-19 ENCOUNTER — Inpatient Hospital Stay: Payer: Medicare Other

## 2017-05-19 ENCOUNTER — Other Ambulatory Visit: Payer: Self-pay | Admitting: Internal Medicine

## 2017-05-19 VITALS — BP 117/76 | HR 75 | Temp 98.1°F | Resp 16

## 2017-05-19 DIAGNOSIS — D631 Anemia in chronic kidney disease: Secondary | ICD-10-CM | POA: Diagnosis not present

## 2017-05-19 DIAGNOSIS — C9002 Multiple myeloma in relapse: Secondary | ICD-10-CM

## 2017-05-19 DIAGNOSIS — Z5111 Encounter for antineoplastic chemotherapy: Secondary | ICD-10-CM | POA: Diagnosis not present

## 2017-05-19 DIAGNOSIS — E876 Hypokalemia: Secondary | ICD-10-CM | POA: Diagnosis not present

## 2017-05-19 DIAGNOSIS — R809 Proteinuria, unspecified: Secondary | ICD-10-CM | POA: Diagnosis not present

## 2017-05-19 DIAGNOSIS — N179 Acute kidney failure, unspecified: Secondary | ICD-10-CM | POA: Diagnosis not present

## 2017-05-19 LAB — COMPREHENSIVE METABOLIC PANEL
ALK PHOS: 68 U/L (ref 38–126)
ALT: 26 U/L (ref 14–54)
ANION GAP: 11 (ref 5–15)
AST: 25 U/L (ref 15–41)
Albumin: 3.6 g/dL (ref 3.5–5.0)
BUN: 15 mg/dL (ref 6–20)
CO2: 21 mmol/L — ABNORMAL LOW (ref 22–32)
Calcium: 8.2 mg/dL — ABNORMAL LOW (ref 8.9–10.3)
Chloride: 106 mmol/L (ref 101–111)
Creatinine, Ser: 0.88 mg/dL (ref 0.44–1.00)
GFR calc non Af Amer: >60 mL/min (ref >60)
Glucose, Bld: 93 mg/dL (ref 65–99)
POTASSIUM: 4.1 mmol/L (ref 3.5–5.1)
Sodium: 138 mmol/L (ref 135–145)
Total Protein: 9.5 g/dL — ABNORMAL HIGH (ref 6.5–8.1)

## 2017-05-19 LAB — CBC WITH DIFFERENTIAL/PLATELET
Basophils Absolute: 0 10*3/uL (ref 0–0.1)
Basophils Relative: 0 %
EOS ABS: 0 10*3/uL (ref 0–0.7)
Eosinophils Relative: 1 %
HEMATOCRIT: 26.2 % — AB (ref 35.0–47.0)
HEMOGLOBIN: 9 g/dL — AB (ref 12.0–16.0)
LYMPHS ABS: 2.9 10*3/uL (ref 1.0–3.6)
Lymphocytes Relative: 44 %
MCH: 32.7 pg (ref 26.0–34.0)
MCHC: 34.2 g/dL (ref 32.0–36.0)
MCV: 95.5 fL (ref 80.0–100.0)
MONO ABS: 0.3 10*3/uL (ref 0.2–0.9)
MONOS PCT: 5 %
NEUTROS ABS: 3.3 10*3/uL (ref 1.4–6.5)
NEUTROS PCT: 50 %
Platelets: 247 10*3/uL (ref 150–440)
RBC: 2.74 MIL/uL — ABNORMAL LOW (ref 3.80–5.20)
RDW: 16.4 % — AB (ref 11.5–14.5)
WBC: 6.6 10*3/uL (ref 3.6–11.0)

## 2017-05-19 MED ORDER — BORTEZOMIB CHEMO SQ INJECTION 3.5 MG (2.5MG/ML)
1.3000 mg/m2 | Freq: Once | INTRAMUSCULAR | Status: AC
  Administered 2017-05-19: 2.25 mg via SUBCUTANEOUS
  Filled 2017-05-19: qty 2.25

## 2017-05-19 MED ORDER — PROCHLORPERAZINE MALEATE 10 MG PO TABS
10.0000 mg | ORAL_TABLET | Freq: Once | ORAL | Status: AC
  Administered 2017-05-19: 10 mg via ORAL
  Filled 2017-05-19: qty 1

## 2017-05-26 ENCOUNTER — Inpatient Hospital Stay (HOSPITAL_BASED_OUTPATIENT_CLINIC_OR_DEPARTMENT_OTHER): Payer: Medicare Other | Admitting: Internal Medicine

## 2017-05-26 ENCOUNTER — Inpatient Hospital Stay: Payer: Medicare Other | Attending: Internal Medicine

## 2017-05-26 ENCOUNTER — Inpatient Hospital Stay: Payer: Medicare Other

## 2017-05-26 VITALS — BP 130/82 | HR 66 | Temp 97.8°F | Resp 18 | Ht 62.0 in | Wt 147.0 lb

## 2017-05-26 DIAGNOSIS — R21 Rash and other nonspecific skin eruption: Secondary | ICD-10-CM | POA: Diagnosis not present

## 2017-05-26 DIAGNOSIS — I2510 Atherosclerotic heart disease of native coronary artery without angina pectoris: Secondary | ICD-10-CM | POA: Diagnosis not present

## 2017-05-26 DIAGNOSIS — N179 Acute kidney failure, unspecified: Secondary | ICD-10-CM

## 2017-05-26 DIAGNOSIS — Z792 Long term (current) use of antibiotics: Secondary | ICD-10-CM | POA: Insufficient documentation

## 2017-05-26 DIAGNOSIS — C9002 Multiple myeloma in relapse: Secondary | ICD-10-CM | POA: Diagnosis not present

## 2017-05-26 DIAGNOSIS — R809 Proteinuria, unspecified: Secondary | ICD-10-CM | POA: Diagnosis not present

## 2017-05-26 DIAGNOSIS — D631 Anemia in chronic kidney disease: Secondary | ICD-10-CM

## 2017-05-26 DIAGNOSIS — Z79899 Other long term (current) drug therapy: Secondary | ICD-10-CM

## 2017-05-26 DIAGNOSIS — Z5111 Encounter for antineoplastic chemotherapy: Secondary | ICD-10-CM | POA: Diagnosis not present

## 2017-05-26 DIAGNOSIS — Z7901 Long term (current) use of anticoagulants: Secondary | ICD-10-CM | POA: Insufficient documentation

## 2017-05-26 DIAGNOSIS — Z23 Encounter for immunization: Secondary | ICD-10-CM | POA: Diagnosis not present

## 2017-05-26 DIAGNOSIS — F17210 Nicotine dependence, cigarettes, uncomplicated: Secondary | ICD-10-CM | POA: Diagnosis not present

## 2017-05-26 DIAGNOSIS — I700 Atherosclerosis of aorta: Secondary | ICD-10-CM

## 2017-05-26 DIAGNOSIS — Z7982 Long term (current) use of aspirin: Secondary | ICD-10-CM

## 2017-05-26 DIAGNOSIS — M899 Disorder of bone, unspecified: Secondary | ICD-10-CM

## 2017-05-26 DIAGNOSIS — M25551 Pain in right hip: Secondary | ICD-10-CM | POA: Diagnosis not present

## 2017-05-26 LAB — CBC WITH DIFFERENTIAL/PLATELET
Basophils Absolute: 0 10*3/uL (ref 0–0.1)
Basophils Relative: 1 %
Eosinophils Absolute: 0.1 10*3/uL (ref 0–0.7)
Eosinophils Relative: 1 %
HEMATOCRIT: 25.9 % — AB (ref 35.0–47.0)
HEMOGLOBIN: 8.8 g/dL — AB (ref 12.0–16.0)
LYMPHS ABS: 3.7 10*3/uL — AB (ref 1.0–3.6)
LYMPHS PCT: 57 %
MCH: 32.8 pg (ref 26.0–34.0)
MCHC: 34.1 g/dL (ref 32.0–36.0)
MCV: 96.1 fL (ref 80.0–100.0)
Monocytes Absolute: 0.3 10*3/uL (ref 0.2–0.9)
Monocytes Relative: 5 %
NEUTROS ABS: 2.4 10*3/uL (ref 1.4–6.5)
NEUTROS PCT: 36 %
Platelets: 267 10*3/uL (ref 150–440)
RBC: 2.7 MIL/uL — AB (ref 3.80–5.20)
RDW: 16.4 % — ABNORMAL HIGH (ref 11.5–14.5)
WBC: 6.6 10*3/uL (ref 3.6–11.0)

## 2017-05-26 LAB — COMPREHENSIVE METABOLIC PANEL
ALBUMIN: 3.4 g/dL — AB (ref 3.5–5.0)
ALT: 13 U/L — AB (ref 14–54)
AST: 22 U/L (ref 15–41)
Alkaline Phosphatase: 73 U/L (ref 38–126)
Anion gap: 9 (ref 5–15)
BUN: 13 mg/dL (ref 6–20)
CHLORIDE: 108 mmol/L (ref 101–111)
CO2: 21 mmol/L — AB (ref 22–32)
CREATININE: 0.91 mg/dL (ref 0.44–1.00)
Calcium: 9 mg/dL (ref 8.9–10.3)
GFR calc Af Amer: >60 mL/min (ref >60)
GFR calc non Af Amer: >60 mL/min (ref >60)
GLUCOSE: 79 mg/dL (ref 65–99)
Potassium: 3.9 mmol/L (ref 3.5–5.1)
SODIUM: 138 mmol/L (ref 135–145)
Total Bilirubin: 0.3 mg/dL (ref 0.3–1.2)
Total Protein: 8.6 g/dL — ABNORMAL HIGH (ref 6.5–8.1)

## 2017-05-26 MED ORDER — BORTEZOMIB CHEMO SQ INJECTION 3.5 MG (2.5MG/ML)
1.3000 mg/m2 | Freq: Once | INTRAMUSCULAR | Status: AC
  Administered 2017-05-26: 2.25 mg via SUBCUTANEOUS
  Filled 2017-05-26: qty 2.25

## 2017-05-26 MED ORDER — PROCHLORPERAZINE MALEATE 10 MG PO TABS
10.0000 mg | ORAL_TABLET | Freq: Once | ORAL | Status: AC
  Administered 2017-05-26: 10 mg via ORAL
  Filled 2017-05-26: qty 1

## 2017-05-26 MED ORDER — DEXAMETHASONE 4 MG PO TABS
40.0000 mg | ORAL_TABLET | Freq: Once | ORAL | Status: AC
  Administered 2017-05-26: 40 mg via ORAL

## 2017-05-26 MED ORDER — INFLUENZA VAC SPLIT HIGH-DOSE 0.5 ML IM SUSY
0.5000 mL | PREFILLED_SYRINGE | Freq: Once | INTRAMUSCULAR | Status: AC
  Administered 2017-05-26: 0.5 mL via INTRAMUSCULAR
  Filled 2017-05-26: qty 0.5

## 2017-05-26 NOTE — Progress Notes (Signed)
Patient stated she did not take decadron prior to appointment.  Medication ordered per Dr. Rogue Bussing.

## 2017-05-26 NOTE — Progress Notes (Signed)
[place] NOTE  Patient Care Team: Center, [facility] as PCP - General (General Practice)  CHIEF COMPLAINTS/PURPOSE OF CONSULTATION: Multiple myeloma Multiple myeloma.  #  Oncology History   # SEP 2018- MULTIPLE MYELOMA IgALamda [2.5 gm/dl; K/L= 88/1298]; STAGE III [beta 2 microglobulin=5.5] [presented with acute renal failure; anemia; NO hypercalcemia; Skeletal survey-Normal]; BMBx- 45% plasma cells; FISH-POSITIVE 11:14 translocation.[STANDARD-high RISK]/cyto-Normal; SEP 2018- PET- L3 posterior element lesion.   # 9/14- velcade SQ twice weekly/Dex 40 mg/week; OCT 5th 2018-Start R [1m]VD; #2- Rev [225m1/01]   # Acute renal failure [Dr.Singh; Proteinuria 1.5gm/day ]; acyclovir/Asprin     Multiple myeloma in relapse (HCC)     HISTORY OF PRESENTING ILLNESS:  Brandi MIKKELSEN89.o.  female recently diagnosed multiple myeloma-  Currently status post cycle #1 of RVD is here for follow-up.  Patient rash on her bilateral lower extremities is currently resolved. Patient has not started back on Revlimid as recommended at last visit. She denies any swelling in the legs.  Her appetite is improving. She is gaining weight. Denies any significant tingling and numbness of the extremities. No nausea no vomiting. No difficult swallowing.  Otherwise no chest pain or shortness of breath. Patient has not had an appointment with [facility] myeloma team yet.   ROS: A complete 10 point review of system is done which is negative except mentioned above in history of present illness  MEDICAL HISTORY:  No past medical history on file.  SURGICAL HISTORY: No past surgical history on file.  SOCIAL HISTORY:  [place] Her daughter lives close by. She smokes about 4-5 cigarettes a day. No alcohol. She used to work in a [place] retired.  Social History   Socioeconomic History  . Marital status: Single    Spouse name: Not on file  .  Number of children: Not on file  . Years of education: Not on file  . Highest education level: Not on file  Social Needs  . Financial resource strain: Not on file  . Food insecurity - worry: Not on file  . Food insecurity - inability: Not on file  . Transportation needs - medical: Not on file  . Transportation needs - non-medical: Not on file  Occupational History  . Not on file  Tobacco Use  . Smoking status: Current Every Day Smoker    Packs/day: 0.50    Types: Cigarettes  . Smokeless tobacco: Never Used  Substance and Sexual Activity  . Alcohol use: No  . Drug use: No  . Sexual activity: Not on file  Other Topics Concern  . Not on file  Social History Narrative   Lives at home with family. Independent at baseline    FAMILY HISTORY: Family History  Problem Relation Age of Onset  . Pneumonia Mother   . Seizures Father     ALLERGIES:  has No Known Allergies.  MEDICATIONS:  Current Outpatient Medications  Medication Sig Dispense Refill  . acyclovir (ZOVIRAX) 400 MG tablet One pill a day [to prevent shingles] 30 tablet 3  . amLODipine (NORVASC) 10 MG tablet Take 1 tablet (10 mg total) by mouth daily. 30 tablet 0  . potassium chloride SA (K-DUR,KLOR-CON) 20 MEQ tablet Take 1 tablet (20 mEq total) by mouth 2 (two) times daily. 30 tablet 0  . temazepam (RESTORIL) 7.5 MG capsule Take 1 capsule (7.5 mg total) by mouth at bedtime. 30 capsule 0  . acetaminophen (TYLENOL) 325 MG tablet Take  1 tablet (325 mg total) by mouth every 6 (six) hours as needed for mild pain (or Fever >/= 101). (Patient not taking: Reported on 05/26/2017)    . dexamethasone (DECADRON) 4 MG tablet Take 10 tablets (40 mg total) by mouth once a week. 10 pills in AM with food. (Patient not taking: Reported on 05/26/2017) 40 tablet 4  . dronabinol (MARINOL) 5 MG capsule Take 1 capsule (5 mg total) by mouth 2 (two) times daily before lunch and supper. (Patient not taking: Reported on 05/26/2017) 60 capsule 3  .  feeding supplement, ENSURE ENLIVE, (ENSURE ENLIVE) LIQD Take 237 mLs by mouth 2 (two) times daily between meals. (Patient not taking: Reported on 05/26/2017) 60 Bottle 0  . lenalidomide (REVLIMID) 20 MG capsule Take 1 capsule (20 mg total) by mouth daily. 2 weeks ON and 1 week OFF (Patient not taking: Reported on 05/26/2017) 14 capsule 3  . pantoprazole (PROTONIX) 40 MG tablet Take 1 tablet (40 mg total) by mouth daily. (Patient not taking: Reported on 05/26/2017)    . polyethylene glycol (MIRALAX / GLYCOLAX) packet Take 17 g by mouth daily as needed for mild constipation. (Patient not taking: Reported on 05/26/2017) 14 each 0  . vitamin B-12 100 MCG tablet Take 1 tablet (100 mcg total) by mouth daily. (Patient not taking: Reported on 05/26/2017) 30 tablet 0   No current facility-administered medications for this visit.       [name]  PHYSICAL EXAMINATION: ECOG PERFORMANCE STATUS: 1 - Symptomatic but completely ambulatory  Vitals:   05/26/17 0901  BP: 130/82  Pulse: 66  Resp: 18  Temp: 97.8 F (36.6 C)   Filed Weights   05/26/17 0901  Weight: 147 lb (66.7 kg)    GENERAL: Well-nourished well-developed; Alert, no distress and comfortable. She is alone.  EYES: no pallor or icterus OROPHARYNX: no thrush or ulceration; good dentition  NECK: supple, no masses felt LYMPH:  no palpable lymphadenopathy in the cervical, axillary or inguinal regions LUNGS: clear to auscultation and  No wheeze or crackles HEART/CVS: regular rate & rhythm and no murmurs; No lower extremity edema ABDOMEN: abdomen soft, non-tender and normal bowel sounds Musculoskeletal:no cyanosis of digits and no clubbing  PSYCH: alert & oriented x 3 with fluent speech NEURO: no focal motor/sensory deficits SKIN:  no rashes or significant lesions/improved.   LABORATORY DATA:  I have reviewed the data as listed Lab Results  Component Value Date   WBC 6.6 05/26/2017   HGB 8.8 (L) 05/26/2017   HCT 25.9 (L) 05/26/2017   MCV 96.1  05/26/2017   PLT 267 05/26/2017   Recent Labs    05/12/17 1048 05/19/17 1303 05/26/17 0830  NA 135 138 138  K 3.4* 4.1 3.9  CL 100* 106 108  CO2 22 21* 21*  GLUCOSE 93 93 79  BUN _0 CREATININE 0.97 0.88 0.91  CALCIUM 8.8* 8.2* 9.0  GFRNONAA 60* >60 >60  GFRAA >60 >60 >60  PROT 8.7* 9.5* 8.6*  ALBUMIN 3.5 3.6 3.4*  AST 85* 25 22  ALT 78* 26 13*  ALKPHOS 83 68 73  BILITOT 0.6 <0.1* 0.3    RADIOGRAPHIC STUDIES: I have personally reviewed the radiological images as listed and agreed with the findings in the report. No results found. IMPRESSION: 1. There is an FDG avid lytic lesion in the right posterior element of L3 consistent with myelomatous involvement. More mild uptake without CT correlate in L5 is nonspecific. Recommend attention on follow-up. No other bony  involvement identified. 2. Atherosclerotic changes seen in the aorta. Coronary artery calcifications. 3. Nodular uptake in the skin and subcutaneous tissues of the right pelvis, likely an injection site. Recommend clinical correlation to exclude a skin lesion. Aortic Atherosclerosis (ICD10-I70.0).   Electronically Signed   By: Dorise Bullion III M.D   On: 04/21/2017 15:09  -------------------------- 05/12/2017  Results for JAELEE, LAUGHTER (MRN [id number]) as of 05/29/2017 12:07  Ref. Range 04/06/2017 14:12 04/10/2017 11:17 04/29/2017 10:10 04/29/2017 10:16 05/26/2017 08:30  Kappa free light chain Latest Ref Range: 3.3 - 19.4 mg/L 88.6 (H)   56.9 (H) 38.6 (H)  Lamda free light chains Latest Ref Range: 5.7 - 26.3 mg/L 1,298.8 (H)   828.9 (H) 944.2 (H)  Kappa, lamda light chain ratio Latest Ref Range: 0.26 - 1.65  0.07 (L)   0.07 (L) 0.04 (L)  M Protein SerPl Elph-Mcnc Latest Ref Range: Not Observed g/dL   3.2 (H)  2.9 (H)     ASSESSMENT & PLAN:   Multiple myeloma in relapse (HCC) # Multiple myeloma-stage III. New diagnosis IgA lambda [sep 5300]; cytogenetics/fish- 11:14-STANDARD/? High RISK];  currently status post cycle #1 of RVD.  Lambda light chains improved; however M protein-sub-optimal response [2.5 g-to 3.2 g to 2.9 ]; however significant improvement of the renal function.  #Patient seems to have had significant clinical response with resolution of renal failure; however M protein/lambda light chain- partial response [C labs above]. Will monitor patient closely- if she has poor response to therapy/ increasing M protein/lambda light chain- recommend switching to KRD.    # # Proceed with Velcade today/and on weekly basis. No evidence of neuropathy noted. Start #2 Revlimid 20 mg 2 weeks on one week off- starting today. Continue weekly dex 40 mg [new script given]  # Acute renal failure [creat 7.5 at presentation]- secondary to multiple myeloma; Proteinuria [1.4 g/day]. Resolved.   # Skeletal lesion- on  X Geva-  # Infectious prophylaxis- continue acyclovir/ DVT prohylaxis- on asprin 325 mg/day.   # Next week/cbc-velcade; in 2 weeks/cbc/cmp-MD/treatment-X-geva.  await [facility] referral.   All questions were answered. The patient knows to call the clinic with any problems, questions or concerns.    [name], MD 05/29/2017 12:25 PM

## 2017-05-26 NOTE — Assessment & Plan Note (Addendum)
#  Multiple myeloma-stage III. New diagnosis IgA lambda [sep 5300]; cytogenetics/fish- 11:14-STANDARD/? High RISK]; currently status post cycle #1 of RVD.  Lambda light chains improved; however M protein-sub-optimal response [2.5 g-to 3.2 g to 2.9 ]; however significant improvement of the renal function.  #Patient seems to have had significant clinical response with resolution of renal failure; however M protein/lambda light chain- partial response [C labs above]. Will monitor patient closely- if she has poor response to therapy/ increasing M protein/lambda light chain- recommend switching to KRD.    # # Proceed with Velcade today/and on weekly basis. No evidence of neuropathy noted. Start #2 Revlimid 20 mg 2 weeks on one week off- starting today. Continue weekly dex 40 mg [new script given]  # Acute renal failure [creat 7.5 at presentation]- secondary to multiple myeloma; Proteinuria [1.4 g/day]. Resolved.   # Skeletal lesion- on  X Geva-  # Infectious prophylaxis- continue acyclovir/ DVT prohylaxis- on asprin 325 mg/day.   # Next week/cbc-velcade; in 2 weeks/cbc/cmp-MD/treatment-X-geva.  await [facility] referral.

## 2017-05-27 LAB — MULTIPLE MYELOMA PANEL, SERUM
ALBUMIN SERPL ELPH-MCNC: 3.3 g/dL (ref 2.9–4.4)
ALPHA 1: 0.2 g/dL (ref 0.0–0.4)
ALPHA2 GLOB SERPL ELPH-MCNC: 0.5 g/dL (ref 0.4–1.0)
Albumin/Glob SerPl: 0.7 (ref 0.7–1.7)
B-Globulin SerPl Elph-Mcnc: 3.8 g/dL — ABNORMAL HIGH (ref 0.7–1.3)
Gamma Glob SerPl Elph-Mcnc: 0.5 g/dL (ref 0.4–1.8)
Globulin, Total: 5 g/dL — ABNORMAL HIGH (ref 2.2–3.9)
IGA: 1878 mg/dL — AB (ref 87–352)
IGG (IMMUNOGLOBIN G), SERUM: 636 mg/dL — AB (ref 700–1600)
IGM (IMMUNOGLOBULIN M), SRM: 11 mg/dL — AB (ref 26–217)
M Protein SerPl Elph-Mcnc: 2.9 g/dL — ABNORMAL HIGH
TOTAL PROTEIN ELP: 8.3 g/dL (ref 6.0–8.5)

## 2017-05-27 LAB — KAPPA/LAMBDA LIGHT CHAINS
KAPPA FREE LGHT CHN: 38.6 mg/L — AB (ref 3.3–19.4)
KAPPA, LAMDA LIGHT CHAIN RATIO: 0.04 — AB (ref 0.26–1.65)
LAMDA FREE LIGHT CHAINS: 944.2 mg/L — AB (ref 5.7–26.3)

## 2017-06-02 ENCOUNTER — Inpatient Hospital Stay: Payer: Medicare Other

## 2017-06-02 ENCOUNTER — Other Ambulatory Visit: Payer: Self-pay | Admitting: Internal Medicine

## 2017-06-02 VITALS — BP 131/69 | HR 69 | Temp 97.8°F | Resp 18

## 2017-06-02 DIAGNOSIS — R809 Proteinuria, unspecified: Secondary | ICD-10-CM | POA: Diagnosis not present

## 2017-06-02 DIAGNOSIS — N179 Acute kidney failure, unspecified: Secondary | ICD-10-CM | POA: Diagnosis not present

## 2017-06-02 DIAGNOSIS — D631 Anemia in chronic kidney disease: Secondary | ICD-10-CM | POA: Diagnosis not present

## 2017-06-02 DIAGNOSIS — C9002 Multiple myeloma in relapse: Secondary | ICD-10-CM | POA: Diagnosis not present

## 2017-06-02 DIAGNOSIS — Z5111 Encounter for antineoplastic chemotherapy: Secondary | ICD-10-CM | POA: Diagnosis not present

## 2017-06-02 DIAGNOSIS — Z23 Encounter for immunization: Secondary | ICD-10-CM | POA: Diagnosis not present

## 2017-06-02 LAB — CBC WITH DIFFERENTIAL/PLATELET
BASOS ABS: 0 10*3/uL (ref 0–0.1)
Basophils Relative: 0 %
EOS ABS: 0.3 10*3/uL (ref 0–0.7)
EOS PCT: 5 %
HCT: 27.1 % — ABNORMAL LOW (ref 35.0–47.0)
Hemoglobin: 9.3 g/dL — ABNORMAL LOW (ref 12.0–16.0)
Lymphocytes Relative: 33 %
Lymphs Abs: 2.5 10*3/uL (ref 1.0–3.6)
MCH: 33.3 pg (ref 26.0–34.0)
MCHC: 34.3 g/dL (ref 32.0–36.0)
MCV: 97 fL (ref 80.0–100.0)
Monocytes Absolute: 0.3 10*3/uL (ref 0.2–0.9)
Monocytes Relative: 4 %
Neutro Abs: 4.4 10*3/uL (ref 1.4–6.5)
Neutrophils Relative %: 58 %
PLATELETS: 219 10*3/uL (ref 150–440)
RBC: 2.79 MIL/uL — AB (ref 3.80–5.20)
RDW: 16.6 % — ABNORMAL HIGH (ref 11.5–14.5)
WBC: 7.5 10*3/uL (ref 3.6–11.0)

## 2017-06-02 LAB — COMPREHENSIVE METABOLIC PANEL
ALT: 18 U/L (ref 14–54)
AST: 38 U/L (ref 15–41)
Albumin: 3.5 g/dL (ref 3.5–5.0)
Alkaline Phosphatase: 69 U/L (ref 38–126)
Anion gap: 9 (ref 5–15)
BILIRUBIN TOTAL: 0.3 mg/dL (ref 0.3–1.2)
BUN: 13 mg/dL (ref 6–20)
CO2: 21 mmol/L — ABNORMAL LOW (ref 22–32)
CREATININE: 0.82 mg/dL (ref 0.44–1.00)
Calcium: 8.4 mg/dL — ABNORMAL LOW (ref 8.9–10.3)
Chloride: 107 mmol/L (ref 101–111)
GFR calc Af Amer: >60 mL/min (ref >60)
Glucose, Bld: 96 mg/dL (ref 65–99)
POTASSIUM: 3.6 mmol/L (ref 3.5–5.1)
Sodium: 137 mmol/L (ref 135–145)
TOTAL PROTEIN: 8.7 g/dL — AB (ref 6.5–8.1)

## 2017-06-02 MED ORDER — BORTEZOMIB CHEMO SQ INJECTION 3.5 MG (2.5MG/ML)
1.3000 mg/m2 | Freq: Once | INTRAMUSCULAR | Status: AC
  Administered 2017-06-02: 2.25 mg via SUBCUTANEOUS
  Filled 2017-06-02: qty 2.25

## 2017-06-02 MED ORDER — PROCHLORPERAZINE MALEATE 10 MG PO TABS
10.0000 mg | ORAL_TABLET | Freq: Once | ORAL | Status: AC
  Administered 2017-06-02: 10 mg via ORAL
  Filled 2017-06-02: qty 1

## 2017-06-02 NOTE — Progress Notes (Signed)
Patient took steroid from home.

## 2017-06-09 ENCOUNTER — Inpatient Hospital Stay (HOSPITAL_BASED_OUTPATIENT_CLINIC_OR_DEPARTMENT_OTHER): Payer: Medicare Other | Admitting: Internal Medicine

## 2017-06-09 ENCOUNTER — Inpatient Hospital Stay: Payer: Medicare Other

## 2017-06-09 VITALS — BP 125/70 | HR 66 | Temp 97.8°F | Resp 20 | Ht 62.0 in | Wt 150.0 lb

## 2017-06-09 DIAGNOSIS — C9002 Multiple myeloma in relapse: Secondary | ICD-10-CM

## 2017-06-09 DIAGNOSIS — I700 Atherosclerosis of aorta: Secondary | ICD-10-CM

## 2017-06-09 DIAGNOSIS — Z7901 Long term (current) use of anticoagulants: Secondary | ICD-10-CM | POA: Diagnosis not present

## 2017-06-09 DIAGNOSIS — N179 Acute kidney failure, unspecified: Secondary | ICD-10-CM

## 2017-06-09 DIAGNOSIS — Z79899 Other long term (current) drug therapy: Secondary | ICD-10-CM

## 2017-06-09 DIAGNOSIS — R21 Rash and other nonspecific skin eruption: Secondary | ICD-10-CM

## 2017-06-09 DIAGNOSIS — Z792 Long term (current) use of antibiotics: Secondary | ICD-10-CM | POA: Diagnosis not present

## 2017-06-09 DIAGNOSIS — M899 Disorder of bone, unspecified: Secondary | ICD-10-CM | POA: Diagnosis not present

## 2017-06-09 DIAGNOSIS — I2510 Atherosclerotic heart disease of native coronary artery without angina pectoris: Secondary | ICD-10-CM | POA: Diagnosis not present

## 2017-06-09 DIAGNOSIS — F17210 Nicotine dependence, cigarettes, uncomplicated: Secondary | ICD-10-CM

## 2017-06-09 DIAGNOSIS — R809 Proteinuria, unspecified: Secondary | ICD-10-CM

## 2017-06-09 DIAGNOSIS — Z5111 Encounter for antineoplastic chemotherapy: Secondary | ICD-10-CM | POA: Diagnosis not present

## 2017-06-09 DIAGNOSIS — Z7982 Long term (current) use of aspirin: Secondary | ICD-10-CM | POA: Diagnosis not present

## 2017-06-09 DIAGNOSIS — D631 Anemia in chronic kidney disease: Secondary | ICD-10-CM

## 2017-06-09 DIAGNOSIS — Z23 Encounter for immunization: Secondary | ICD-10-CM | POA: Diagnosis not present

## 2017-06-09 LAB — CBC WITH DIFFERENTIAL/PLATELET
BASOS ABS: 0 10*3/uL (ref 0–0.1)
Basophils Relative: 0 %
EOS PCT: 6 %
Eosinophils Absolute: 0.4 10*3/uL (ref 0–0.7)
HCT: 27.6 % — ABNORMAL LOW (ref 35.0–47.0)
Hemoglobin: 9.3 g/dL — ABNORMAL LOW (ref 12.0–16.0)
LYMPHS PCT: 28 %
Lymphs Abs: 2 10*3/uL (ref 1.0–3.6)
MCH: 32.9 pg (ref 26.0–34.0)
MCHC: 33.8 g/dL (ref 32.0–36.0)
MCV: 97.4 fL (ref 80.0–100.0)
Monocytes Absolute: 0.3 10*3/uL (ref 0.2–0.9)
Monocytes Relative: 5 %
Neutro Abs: 4.2 10*3/uL (ref 1.4–6.5)
Neutrophils Relative %: 61 %
Platelets: 194 10*3/uL (ref 150–440)
RBC: 2.83 MIL/uL — ABNORMAL LOW (ref 3.80–5.20)
RDW: 16.2 % — AB (ref 11.5–14.5)
WBC: 7 10*3/uL (ref 3.6–11.0)

## 2017-06-09 LAB — COMPREHENSIVE METABOLIC PANEL
ALT: 19 U/L (ref 14–54)
AST: 29 U/L (ref 15–41)
Albumin: 3.3 g/dL — ABNORMAL LOW (ref 3.5–5.0)
Alkaline Phosphatase: 56 U/L (ref 38–126)
Anion gap: 9 (ref 5–15)
BUN: 11 mg/dL (ref 6–20)
CHLORIDE: 107 mmol/L (ref 101–111)
CO2: 21 mmol/L — ABNORMAL LOW (ref 22–32)
Calcium: 8.1 mg/dL — ABNORMAL LOW (ref 8.9–10.3)
Creatinine, Ser: 0.82 mg/dL (ref 0.44–1.00)
Glucose, Bld: 86 mg/dL (ref 65–99)
POTASSIUM: 3.2 mmol/L — AB (ref 3.5–5.1)
Sodium: 137 mmol/L (ref 135–145)
Total Bilirubin: 0.3 mg/dL (ref 0.3–1.2)
Total Protein: 8.3 g/dL — ABNORMAL HIGH (ref 6.5–8.1)

## 2017-06-09 MED ORDER — BORTEZOMIB CHEMO SQ INJECTION 3.5 MG (2.5MG/ML)
1.3000 mg/m2 | Freq: Once | INTRAMUSCULAR | Status: AC
  Administered 2017-06-09: 2.25 mg via SUBCUTANEOUS
  Filled 2017-06-09: qty 2.25

## 2017-06-09 MED ORDER — PROCHLORPERAZINE MALEATE 10 MG PO TABS
10.0000 mg | ORAL_TABLET | Freq: Once | ORAL | Status: AC
  Administered 2017-06-09: 10 mg via ORAL
  Filled 2017-06-09: qty 1

## 2017-06-09 NOTE — Progress Notes (Signed)
Hold X-geva today per Dr. Rogue Bussing.

## 2017-06-09 NOTE — Patient Instructions (Signed)
#  Take zantac Over the counter once a day # Benadryl 25 mg every 8 hours as needed for skin rash # hydrocortisone cream on skin twice a day. # ca+vit D twice a day

## 2017-06-09 NOTE — Assessment & Plan Note (Addendum)
#  Multiple myeloma-stage III. New diagnosis IgA lambda [sep 1427]; cytogenetics/fish- 11:14-STANDARD/? High RISK]; currently status post cycle #2 of RVD.  Lambda light chains improved; however M protein-sub-optimal response [2.5 g-to 3.2 g to 2.9 ]; however significant improvement of the renal function.  #Patient seems to have had significant clinical response with resolution of renal failure; however M protein/lambda light chain- partial response [C labs above]. Will monitor patient closely- if she has poor response to therapy/ increasing M protein/lambda light chain- recommend switching to KRD.    #For now continue-restarting Revlimid in approximately 1 week; continue Velcade subcu weekly-no neuropathy noted.  # skin rash-likely secondary to Revlimid.  Finished Revlimid today.  Recommend using steroid ointments antihistamine.   # Acute renal failure [creat 7.5 at presentation]- secondary to multiple myeloma; Proteinuria [1.4 g/day]. Resolved.   # Skeletal lesion- on  X Geva-HOLD today;ca- 8.3 recommend ca+vit D  # Infectious prophylaxis- continue acyclovir/ DVT prohylaxis- on asprin 325 mg/day.   # velace next wed/ [sec thanksgvig]; follow up in2 weeks/labs/X-geva-multiple myeloma panel/kappa lambda light chain ratio.  If not improving-question switch to KRD.

## 2017-06-09 NOTE — Progress Notes (Signed)
Patient here for follow-up for multiple myleoma. She reports a fine itching rash from back of neck, back, arms, chest and abdominal fold. No visible rash below the lower extremities. She last took last cycle of Revlimid yesterday and is scheduled to start her week break tomorrow.  Patient denies any pain today. Still reports generalized stiffness in right hip joint with prolonged sitting.

## 2017-06-09 NOTE — Progress Notes (Signed)
[place] NOTE  Patient Care Team: Center, [facility] as PCP - General (General Practice)  CHIEF COMPLAINTS/PURPOSE OF CONSULTATION: Multiple myeloma Multiple myeloma.  #  Oncology History   # SEP 2018- MULTIPLE MYELOMA IgALamda [2.5 gm/dl; K/L= 88/1298]; STAGE III [beta 2 microglobulin=5.5] [presented with acute renal failure; anemia; NO hypercalcemia; Skeletal survey-Normal]; BMBx- 45% plasma cells; FISH-POSITIVE 11:14 translocation.[STANDARD-high RISK]/cyto-Normal; SEP 2018- PET- L3 posterior element lesion.   # 9/14- velcade SQ twice weekly/Dex 40 mg/week; OCT 5th 2018-Start R ['10mg'$ ]VD; #2- Rev ['20mg'$ 11/01]   # Acute renal failure [Dr.Singh; Proteinuria 1.5gm/day ]; acyclovir/Asprin     Multiple myeloma in relapse (HCC)     HISTORY OF PRESENTING ILLNESS:  Brandi [name] 66 y.o.  female recently diagnosed multiple myeloma-  Currently status post cycle #2 of RVD is here for follow-up.  Patient finished Revlimid 2 weeks cycle-this morning.  However approximately 10 days into the cycle-noted to have itchy scaly rash upper torso back.  She has not been using any antihistamine or hydrocortisone cream.  Denies any significant tingling and numbness of the extremities. No nausea no vomiting. No difficult swallowing.  Otherwise no chest pain or shortness of breath. Patient has not had an appointment with [facility] myeloma team yet.   ROS: A complete 10 point review of system is done which is negative except mentioned above in history of present illness  MEDICAL HISTORY:  No past medical history on file.  SURGICAL HISTORY: No past surgical history on file.  SOCIAL HISTORY:  She lives in [place] with her son. Her daughter lives close by. She smokes about 4-5 cigarettes a day. No alcohol. She used to work in a [employer]. Currently retired.  Social History   Socioeconomic History  . Marital status: Single    Spouse name: Not on file  . Number of  children: Not on file  . Years of education: Not on file  . Highest education level: Not on file  Social Needs  . Financial resource strain: Not on file  . Food insecurity - worry: Not on file  . Food insecurity - inability: Not on file  . Transportation needs - medical: Not on file  . Transportation needs - non-medical: Not on file  Occupational History  . Not on file  Tobacco Use  . Smoking status: Current Every Day Smoker    Packs/day: 0.50    Types: Cigarettes  . Smokeless tobacco: Never Used  Substance and Sexual Activity  . Alcohol use: No  . Drug use: No  . Sexual activity: Not on file  Other Topics Concern  . Not on file  Social History Narrative   Lives at home with family. Independent at baseline    FAMILY HISTORY: Family History  Problem Relation Age of Onset  . Pneumonia Mother   . Seizures Father     ALLERGIES:  has No Known Allergies.  MEDICATIONS:  Current Outpatient Medications  Medication Sig Dispense Refill  . acetaminophen (TYLENOL) 325 MG tablet Take 1 tablet (325 mg total) by mouth every 6 (six) hours as needed for mild pain (or Fever >/= 101).    [name] acyclovir (ZOVIRAX) 400 MG tablet One pill a day [to prevent shingles] 30 tablet 3  . amLODipine (NORVASC) 10 MG tablet Take 1 tablet (10 mg total) by mouth daily. 30 tablet 0  . dexamethasone (DECADRON) 4 MG tablet Take 10 tablets (40 mg total) by mouth once a week. 10 pills in AM with food. [place]  tablet 4  . potassium chloride SA (K-DUR,KLOR-CON) 20 MEQ tablet Take 1 tablet (20 mEq total) by mouth 2 (two) times daily. 30 tablet 0  . REVLIMID 20 MG capsule TAKE 1 CAPSULE BY MOUTH EVERY DAY FOR 14 DAYS, THEN 7 DAYS OFF 14 capsule 0  . dronabinol (MARINOL) 5 MG capsule Take 1 capsule (5 mg total) by mouth 2 (two) times daily before lunch and supper. (Patient not taking: Reported on 05/26/2017) 60 capsule 3  . feeding supplement, ENSURE ENLIVE, (ENSURE ENLIVE) LIQD Take 237 mLs by mouth 2 (two) times daily  between meals. (Patient not taking: Reported on 05/26/2017) 60 Bottle 0  . pantoprazole (PROTONIX) 40 MG tablet Take 1 tablet (40 mg total) by mouth daily. (Patient not taking: Reported on 05/26/2017)    . polyethylene glycol (MIRALAX / GLYCOLAX) packet Take 17 g by mouth daily as needed for mild constipation. (Patient not taking: Reported on 05/26/2017) 14 each 0  . temazepam (RESTORIL) 7.5 MG capsule Take 1 capsule (7.5 mg total) by mouth at bedtime. (Patient not taking: Reported on 06/09/2017) 30 capsule 0  . vitamin B-12 100 MCG tablet Take 1 tablet (100 mcg total) by mouth daily. (Patient not taking: Reported on 05/26/2017) 30 tablet 0   No current facility-administered medications for this visit.       [name]  PHYSICAL EXAMINATION: ECOG PERFORMANCE STATUS: 1 - Symptomatic but completely ambulatory  Vitals:   06/09/17 0927  BP: 125/70  Pulse: 66  Resp: 20  Temp: 97.8 F (36.6 C)   Filed Weights   06/09/17 0927  Weight: 150 lb (68 kg)    GENERAL: Well-nourished well-developed; Alert, no distress and comfortable. She is alone.  EYES: no pallor or icterus OROPHARYNX: no thrush or ulceration; good dentition  NECK: supple, no masses felt LYMPH:  no palpable lymphadenopathy in the cervical, axillary or inguinal regions LUNGS: clear to auscultation and  No wheeze or crackles HEART/CVS: regular rate & rhythm and no murmurs; No lower extremity edema ABDOMEN: abdomen soft, non-tender and normal bowel sounds Musculoskeletal:no cyanosis of digits and no clubbing  PSYCH: alert & oriented x 3 with fluent speech NEURO: no focal motor/sensory deficits SKIN: Faint rash on her torso/   LABORATORY DATA:  I have reviewed the data as listed Lab Results  Component Value Date   WBC 7.0 06/09/2017   HGB 9.3 (L) 06/09/2017   HCT 27.6 (L) 06/09/2017   MCV 97.4 06/09/2017   PLT 194 06/09/2017   Recent Labs    05/26/17 0830 06/02/17 0938 06/09/17 0900  NA 138 137 137  K 3.9 3.6 3.2*  CL 108  107 107  CO2 21* 21* 21*  GLUCOSE 79 96 86  BUN _0 CREATININE 0.91 0.82 0.82  CALCIUM 9.0 8.4* 8.1*  GFRNONAA >60 >60 >60  GFRAA >60 >60 >60  PROT 8.6* 8.7* 8.3*  ALBUMIN 3.4* 3.5 3.3*  AST 22 38 29  ALT 13* 18 19  ALKPHOS 73 69 56  BILITOT 0.3 0.3 0.3    RADIOGRAPHIC STUDIES: I have personally reviewed the radiological images as listed and agreed with the findings in the report. No results found. IMPRESSION: 1. There is an FDG avid lytic lesion in the right posterior element of L3 consistent with myelomatous involvement. More mild uptake without CT correlate in L5 is nonspecific. Recommend attention on follow-up. No other bony involvement identified. 2. Atherosclerotic changes seen in the aorta. Coronary artery calcifications. 3. Nodular uptake in the skin and  subcutaneous tissues of the right pelvis, likely an injection site. Recommend clinical correlation to exclude a skin lesion. Aortic Atherosclerosis (ICD10-I70.0).   Electronically Signed   By: Dorise Bullion III M.D   On: 04/21/2017 15:09  -------------------------- 05/12/2017  Results for JESSICAH, CROLL (MRN [id number]) as of 05/29/2017 12:07  Ref. Range 04/06/2017 14:12 04/10/2017 11:17 04/29/2017 10:10 04/29/2017 10:16 05/26/2017 08:30  Kappa free light chain Latest Ref Range: 3.3 - 19.4 mg/L 88.6 (H)   56.9 (H) 38.6 (H)  Lamda free light chains Latest Ref Range: 5.7 - 26.3 mg/L 1,298.8 (H)   828.9 (H) 944.2 (H)  Kappa, lamda light chain ratio Latest Ref Range: 0.26 - 1.65  0.07 (L)   0.07 (L) 0.04 (L)  M Protein SerPl Elph-Mcnc Latest Ref Range: Not Observed g/dL   3.2 (H)  2.9 (H)     ASSESSMENT & PLAN:   Multiple myeloma in relapse (HCC) # Multiple myeloma-stage III. New diagnosis IgA lambda [sep 8498]; cytogenetics/fish- 11:14-STANDARD/? High RISK]; currently status post cycle #2 of RVD.  Lambda light chains improved; however M protein-sub-optimal response [2.5 g-to 3.2 g to 2.9 ]; however  significant improvement of the renal function.  #Patient seems to have had significant clinical response with resolution of renal failure; however M protein/lambda light chain- partial response [C labs above]. Will monitor patient closely- if she has poor response to therapy/ increasing M protein/lambda light chain- recommend switching to KRD.    #For now continue-restarting Revlimid in approximately 1 week; continue Velcade subcu weekly-no neuropathy noted.  # skin rash-likely secondary to Revlimid.  Finished Revlimid today.  Recommend using steroid ointments antihistamine.   # Acute renal failure [creat 7.5 at presentation]- secondary to multiple myeloma; Proteinuria [1.4 g/day]. Resolved.   # Skeletal lesion- on  X [place]-HOLD today;ca- 8.3 recommend ca+vit D  # Infectious prophylaxis- continue acyclovir/ DVT prohylaxis- on asprin 325 mg/day.   # velace next wed/ [sec thanksgvig]; follow up in2 weeks/labs/X-geva-multiple myeloma panel/kappa lambda light chain ratio.  If not improving-question switch to KRD.   All questions were answered. The patient knows to call the clinic with any problems, questions or concerns.    [name], MD 06/10/2017 9:47 PM

## 2017-06-15 ENCOUNTER — Inpatient Hospital Stay: Payer: Medicare Other

## 2017-06-15 VITALS — BP 135/72 | HR 64 | Temp 97.4°F | Resp 20 | Wt 150.0 lb

## 2017-06-15 DIAGNOSIS — Z23 Encounter for immunization: Secondary | ICD-10-CM | POA: Diagnosis not present

## 2017-06-15 DIAGNOSIS — Z5111 Encounter for antineoplastic chemotherapy: Secondary | ICD-10-CM | POA: Diagnosis not present

## 2017-06-15 DIAGNOSIS — D631 Anemia in chronic kidney disease: Secondary | ICD-10-CM | POA: Diagnosis not present

## 2017-06-15 DIAGNOSIS — C9002 Multiple myeloma in relapse: Secondary | ICD-10-CM

## 2017-06-15 DIAGNOSIS — R809 Proteinuria, unspecified: Secondary | ICD-10-CM | POA: Diagnosis not present

## 2017-06-15 DIAGNOSIS — N179 Acute kidney failure, unspecified: Secondary | ICD-10-CM | POA: Diagnosis not present

## 2017-06-15 LAB — CBC WITH DIFFERENTIAL/PLATELET
Basophils Absolute: 0 10*3/uL (ref 0–0.1)
Basophils Relative: 1 %
Eosinophils Absolute: 0.2 10*3/uL (ref 0–0.7)
Eosinophils Relative: 3 %
HEMATOCRIT: 26.5 % — AB (ref 35.0–47.0)
HEMOGLOBIN: 8.9 g/dL — AB (ref 12.0–16.0)
LYMPHS ABS: 2.4 10*3/uL (ref 1.0–3.6)
Lymphocytes Relative: 41 %
MCH: 32.8 pg (ref 26.0–34.0)
MCHC: 33.6 g/dL (ref 32.0–36.0)
MCV: 97.6 fL (ref 80.0–100.0)
MONOS PCT: 6 %
Monocytes Absolute: 0.3 10*3/uL (ref 0.2–0.9)
NEUTROS ABS: 2.9 10*3/uL (ref 1.4–6.5)
NEUTROS PCT: 49 %
Platelets: 197 10*3/uL (ref 150–440)
RBC: 2.72 MIL/uL — ABNORMAL LOW (ref 3.80–5.20)
RDW: 15.6 % — ABNORMAL HIGH (ref 11.5–14.5)
WBC: 5.8 10*3/uL (ref 3.6–11.0)

## 2017-06-15 LAB — COMPREHENSIVE METABOLIC PANEL
ALBUMIN: 3.3 g/dL — AB (ref 3.5–5.0)
ALK PHOS: 57 U/L (ref 38–126)
ALT: 15 U/L (ref 14–54)
ANION GAP: 10 (ref 5–15)
AST: 17 U/L (ref 15–41)
BILIRUBIN TOTAL: 0.3 mg/dL (ref 0.3–1.2)
BUN: 16 mg/dL (ref 6–20)
CALCIUM: 8.9 mg/dL (ref 8.9–10.3)
CO2: 22 mmol/L (ref 22–32)
Chloride: 107 mmol/L (ref 101–111)
Creatinine, Ser: 0.85 mg/dL (ref 0.44–1.00)
GFR calc Af Amer: >60 mL/min (ref >60)
Glucose, Bld: 91 mg/dL (ref 65–99)
POTASSIUM: 3.8 mmol/L (ref 3.5–5.1)
Sodium: 139 mmol/L (ref 135–145)
TOTAL PROTEIN: 8.1 g/dL (ref 6.5–8.1)

## 2017-06-15 MED ORDER — PROCHLORPERAZINE MALEATE 10 MG PO TABS
10.0000 mg | ORAL_TABLET | Freq: Once | ORAL | Status: AC
  Administered 2017-06-15: 10 mg via ORAL
  Filled 2017-06-15: qty 1

## 2017-06-15 MED ORDER — BORTEZOMIB CHEMO SQ INJECTION 3.5 MG (2.5MG/ML)
1.3000 mg/m2 | Freq: Once | INTRAMUSCULAR | Status: AC
  Administered 2017-06-15: 2.25 mg via SUBCUTANEOUS
  Filled 2017-06-15: qty 2.25

## 2017-06-17 ENCOUNTER — Other Ambulatory Visit: Payer: Self-pay | Admitting: Internal Medicine

## 2017-06-17 DIAGNOSIS — C9002 Multiple myeloma in relapse: Secondary | ICD-10-CM

## 2017-06-20 ENCOUNTER — Other Ambulatory Visit: Payer: Self-pay | Admitting: *Deleted

## 2017-06-20 DIAGNOSIS — C9002 Multiple myeloma in relapse: Secondary | ICD-10-CM

## 2017-06-20 NOTE — Telephone Encounter (Signed)
Dx:  Multiple myeloma in relapse ([place])   Ref Range & Units 5d ago  Potassium 3.5 - 5.1 mmol/L 3.8

## 2017-06-22 MED ORDER — POTASSIUM CHLORIDE CRYS ER 20 MEQ PO TBCR: 20.0000 meq | EXTENDED_RELEASE_TABLET | Freq: Two times a day (BID) | ORAL | 0 refills | Status: DC

## 2017-06-22 MED ORDER — AMLODIPINE BESYLATE 10 MG PO TABS: 10.0000 mg | ORAL_TABLET | Freq: Every day | ORAL | 0 refills | Status: DC

## 2017-06-23 ENCOUNTER — Inpatient Hospital Stay: Payer: Medicare Other

## 2017-06-23 ENCOUNTER — Telehealth: Payer: Self-pay | Admitting: Pharmacist

## 2017-06-23 ENCOUNTER — Inpatient Hospital Stay (HOSPITAL_BASED_OUTPATIENT_CLINIC_OR_DEPARTMENT_OTHER): Payer: Medicare Other | Admitting: Internal Medicine

## 2017-06-23 ENCOUNTER — Encounter: Payer: Self-pay | Admitting: Internal Medicine

## 2017-06-23 ENCOUNTER — Other Ambulatory Visit: Payer: Self-pay

## 2017-06-23 VITALS — BP 126/70 | HR 63 | Temp 97.8°F | Resp 20 | Ht 62.0 in | Wt 151.2 lb

## 2017-06-23 DIAGNOSIS — C9002 Multiple myeloma in relapse: Secondary | ICD-10-CM

## 2017-06-23 DIAGNOSIS — F17210 Nicotine dependence, cigarettes, uncomplicated: Secondary | ICD-10-CM

## 2017-06-23 DIAGNOSIS — D631 Anemia in chronic kidney disease: Secondary | ICD-10-CM

## 2017-06-23 DIAGNOSIS — M25551 Pain in right hip: Secondary | ICD-10-CM | POA: Diagnosis not present

## 2017-06-23 DIAGNOSIS — M899 Disorder of bone, unspecified: Secondary | ICD-10-CM

## 2017-06-23 DIAGNOSIS — I700 Atherosclerosis of aorta: Secondary | ICD-10-CM | POA: Diagnosis not present

## 2017-06-23 DIAGNOSIS — Z23 Encounter for immunization: Secondary | ICD-10-CM | POA: Diagnosis not present

## 2017-06-23 DIAGNOSIS — N179 Acute kidney failure, unspecified: Secondary | ICD-10-CM | POA: Diagnosis not present

## 2017-06-23 DIAGNOSIS — I2510 Atherosclerotic heart disease of native coronary artery without angina pectoris: Secondary | ICD-10-CM | POA: Diagnosis not present

## 2017-06-23 DIAGNOSIS — R21 Rash and other nonspecific skin eruption: Secondary | ICD-10-CM | POA: Diagnosis not present

## 2017-06-23 DIAGNOSIS — R809 Proteinuria, unspecified: Secondary | ICD-10-CM

## 2017-06-23 DIAGNOSIS — Z79899 Other long term (current) drug therapy: Secondary | ICD-10-CM

## 2017-06-23 DIAGNOSIS — Z7982 Long term (current) use of aspirin: Secondary | ICD-10-CM

## 2017-06-23 DIAGNOSIS — Z7901 Long term (current) use of anticoagulants: Secondary | ICD-10-CM | POA: Diagnosis not present

## 2017-06-23 DIAGNOSIS — Z792 Long term (current) use of antibiotics: Secondary | ICD-10-CM

## 2017-06-23 DIAGNOSIS — C9000 Multiple myeloma not having achieved remission: Secondary | ICD-10-CM | POA: Diagnosis not present

## 2017-06-23 DIAGNOSIS — Z5111 Encounter for antineoplastic chemotherapy: Secondary | ICD-10-CM | POA: Diagnosis not present

## 2017-06-23 LAB — CBC WITH DIFFERENTIAL/PLATELET
BASOS PCT: 1 %
Basophils Absolute: 0.1 10*3/uL (ref 0–0.1)
Eosinophils Absolute: 0.2 10*3/uL (ref 0–0.7)
Eosinophils Relative: 4 %
HEMATOCRIT: 27.5 % — AB (ref 35.0–47.0)
HEMOGLOBIN: 9.2 g/dL — AB (ref 12.0–16.0)
LYMPHS ABS: 2.9 10*3/uL (ref 1.0–3.6)
Lymphocytes Relative: 45 %
MCH: 33 pg (ref 26.0–34.0)
MCHC: 33.6 g/dL (ref 32.0–36.0)
MCV: 98.1 fL (ref 80.0–100.0)
MONOS PCT: 3 %
Monocytes Absolute: 0.2 10*3/uL (ref 0.2–0.9)
NEUTROS ABS: 3 10*3/uL (ref 1.4–6.5)
NEUTROS PCT: 47 %
Platelets: 262 10*3/uL (ref 150–440)
RBC: 2.8 MIL/uL — ABNORMAL LOW (ref 3.80–5.20)
RDW: 15.9 % — ABNORMAL HIGH (ref 11.5–14.5)
WBC: 6.3 10*3/uL (ref 3.6–11.0)

## 2017-06-23 LAB — COMPREHENSIVE METABOLIC PANEL
ALBUMIN: 3.5 g/dL (ref 3.5–5.0)
ALK PHOS: 59 U/L (ref 38–126)
ALT: 14 U/L (ref 14–54)
ANION GAP: 10 (ref 5–15)
AST: 25 U/L (ref 15–41)
BILIRUBIN TOTAL: 0.3 mg/dL (ref 0.3–1.2)
BUN: 9 mg/dL (ref 6–20)
CALCIUM: 8.3 mg/dL — AB (ref 8.9–10.3)
CO2: 23 mmol/L (ref 22–32)
CREATININE: 0.79 mg/dL (ref 0.44–1.00)
Chloride: 106 mmol/L (ref 101–111)
GFR calc Af Amer: >60 mL/min (ref >60)
GFR calc non Af Amer: >60 mL/min (ref >60)
GLUCOSE: 108 mg/dL — AB (ref 65–99)
Potassium: 3.3 mmol/L — ABNORMAL LOW (ref 3.5–5.1)
Sodium: 139 mmol/L (ref 135–145)
TOTAL PROTEIN: 8.3 g/dL — AB (ref 6.5–8.1)

## 2017-06-23 MED ORDER — PROCHLORPERAZINE MALEATE 10 MG PO TABS
10.0000 mg | ORAL_TABLET | Freq: Once | ORAL | Status: AC
  Administered 2017-06-23: 10 mg via ORAL
  Filled 2017-06-23: qty 1

## 2017-06-23 MED ORDER — DENOSUMAB 120 MG/1.7ML ~~LOC~~ SOLN
120.0000 mg | Freq: Once | SUBCUTANEOUS | Status: AC
  Administered 2017-06-23: 120 mg via SUBCUTANEOUS
  Filled 2017-06-23: qty 1.7

## 2017-06-23 MED ORDER — BORTEZOMIB CHEMO SQ INJECTION 3.5 MG (2.5MG/ML)
1.3000 mg/m2 | Freq: Once | INTRAMUSCULAR | Status: AC
  Administered 2017-06-23: 2.25 mg via SUBCUTANEOUS
  Filled 2017-06-23: qty 2.25

## 2017-06-23 NOTE — Telephone Encounter (Signed)
Patient order Delton See. Corrected Calcium is 8.7.

## 2017-06-23 NOTE — Assessment & Plan Note (Addendum)
#  Multiple myeloma-stage III. New diagnosis IgA lambda [sep 3882]; cytogenetics/fish- 11:14-STANDARD/? High RISK]; currently status post cycle #2 of RVD.  Lambda light chains improved; however M protein-sub-optimal response [2.5 g-to 3.2 g to 2.9 ]; however significant improvement of the renal function. Myeloma number from today pending.   #Patient seems to have had significant clinical response with resolution of renal failure; however M protein/lambda light chain- partial response [C labs above]. Will monitor patient closely- if she has poor response to therapy/ increasing M protein/lambda light chain- recommend switching to KRD.   #For now continue with Revlimid 20 mg a day [2 weeks on 1 week off]; weekly Velcade; weekly dexamethasone 40 mg a day.  # skin rash-likely secondary to Revlimid. Improved.  # Acute renal failure [creat 7.5 at presentation]- secondary to multiple myeloma; Proteinuria [1.4 g/day]. Resolved.   # right hip pain- radiating to right LE- if worse to inform us; will get MRI lumbar spine/Mri pelvis. ? RT. Not on pain meds.   # Skeletal lesion- on  X [place]- proceed today ca- 8.6 recommend ca+vit D  # Infectious prophylaxis- continue acyclovir/ DVT prohylaxis- on asprin 325 mg/day.   # weekly valcade; follow up with me in 2 weeks/labs/velcade.   # awaiting on multiple myeloma panel/kappa lambda light chain ratio;  If not improving-question switch to KRD.   Dec 20th/[facility] appt-transplant eval.

## 2017-06-23 NOTE — Progress Notes (Signed)
[place] NOTE  Patient Care Team: Center, [facility] as PCP - General (General Practice)  CHIEF COMPLAINTS/PURPOSE OF CONSULTATION: Multiple myeloma Multiple myeloma.  #  Oncology History   # SEP 2018- MULTIPLE MYELOMA IgALamda [2.5 gm/dl; K/L= 88/1298]; STAGE III [beta 2 microglobulin=5.5] [presented with acute renal failure; anemia; NO hypercalcemia; Skeletal survey-Normal]; BMBx- 45% plasma cells; FISH-POSITIVE 11:14 translocation.[STANDARD-high RISK]/cyto-Normal; SEP 2018- PET- L3 posterior element lesion.   # 9/14- velcade SQ twice weekly/Dex 40 mg/week; OCT 5th 2018-Start R [83m]VD; #2- Rev [256m1/01]   # Acute renal failure [Brandi [name]; Proteinuria 1.5gm/day ]; acyclovir/Asprin     Multiple myeloma in relapse (HCC)     HISTORY OF PRESENTING ILLNESS:  Brandi BARI89.o.  female recently diagnosed multiple myeloma-on Revlimid is here for follow-up.  Patient complains of vague pain in the right hip radiating down her right leg for the last few weeks.  She is currently restarted back on Revlimid 20 mg a day- [#3  Cycle 1 week ago.]  Denies any significant tingling and numbness of the extremities. No nausea no vomiting. No difficult swallowing.  Otherwise no chest pain or shortness of breath.  ROS: A complete 10 point review of system is done which is negative except mentioned above in history of present illness  MEDICAL HISTORY:  History reviewed. No pertinent past medical history.  SURGICAL HISTORY: History reviewed. No pertinent surgical history.  SOCIAL HISTORY:  [place] Her daughter lives close by. She smokes about 4-5 cigarettes a day. No alcohol. She used to work in a [employer] [place].  Social History   Socioeconomic History  . Marital status: Single    Spouse name: Not on file  . Number of children: Not on file  . Years of education: Not on file  . Highest education level: Not on  file  Social Needs  . Financial resource strain: Not on file  . Food insecurity - worry: Not on file  . Food insecurity - inability: Not on file  . Transportation needs - medical: Not on file  . Transportation needs - non-medical: Not on file  Occupational History  . Not on file  Tobacco Use  . Smoking status: Current Every Day Smoker    Packs/day: 0.50    Types: Cigarettes  . Smokeless tobacco: Never Used  Substance and Sexual Activity  . Alcohol use: No  . Drug use: No  . Sexual activity: Not on file  Other Topics Concern  . Not on file  Social History Narrative   Lives at home with family. Independent at baseline    FAMILY HISTORY: Family History  Problem Relation Age of Onset  . Pneumonia Mother   . Seizures Father     ALLERGIES:  has No Known Allergies.  MEDICATIONS:  Current Outpatient Medications  Medication Sig Dispense Refill  . acyclovir (ZOVIRAX) 400 MG tablet One pill a day [to prevent shingles] 30 tablet 3  . amLODipine (NORVASC) 10 MG tablet Take 1 tablet (10 mg total) by mouth daily. 30 tablet 0  . dexamethasone (DECADRON) 4 MG tablet Take 10 tablets (40 mg total) by mouth once a week. 10 pills in AM with food. 40 tablet 4  . potassium chloride SA (K-DUR,KLOR-CON) 20 MEQ tablet Take 1 tablet (20 mEq total) by mouth 2 (two) times daily. 30 tablet 0  . REVLIMID 20 MG capsule TAKE 1 CAPSULE BY MOUTH EVERY DAY FOR 14 DAYS ON THEN 7  DAYS OFF 14 capsule 0  . acetaminophen (TYLENOL) 325 MG tablet Take 1 tablet (325 mg total) by mouth every 6 (six) hours as needed for mild pain (or Fever >/= 101). (Patient not taking: Reported on 06/23/2017)    . dronabinol (MARINOL) 5 MG capsule Take 1 capsule (5 mg total) by mouth 2 (two) times daily before lunch and supper. (Patient not taking: Reported on 05/26/2017) 60 capsule 3  . feeding supplement, ENSURE ENLIVE, (ENSURE ENLIVE) LIQD Take 237 mLs by mouth 2 (two) times daily between meals. (Patient not taking: Reported on  05/26/2017) 60 Bottle 0  . pantoprazole (PROTONIX) 40 MG tablet Take 1 tablet (40 mg total) by mouth daily. (Patient not taking: Reported on 05/26/2017)    . polyethylene glycol (MIRALAX / GLYCOLAX) packet Take 17 g by mouth daily as needed for mild constipation. (Patient not taking: Reported on 05/26/2017) 14 each 0  . temazepam (RESTORIL) 7.5 MG capsule Take 1 capsule (7.5 mg total) by mouth at bedtime. (Patient not taking: Reported on 06/09/2017) 30 capsule 0  . vitamin B-12 100 MCG tablet Take 1 tablet (100 mcg total) by mouth daily. (Patient not taking: Reported on 05/26/2017) 30 tablet 0   No current facility-administered medications for this visit.       [name]  PHYSICAL EXAMINATION: ECOG PERFORMANCE STATUS: 1 - Symptomatic but completely ambulatory  Vitals:   06/23/17 0941  BP: 126/70  Pulse: 63  Resp: 20  Temp: 97.8 F (36.6 C)  SpO2: 100%   Filed Weights   06/23/17 0941  Weight: 151 lb 3.2 oz (68.6 kg)    GENERAL: Well-nourished well-developed; Alert, no distress and comfortable. She is accompanied by family. EYES: no pallor or icterus OROPHARYNX: no thrush or ulceration; good dentition  NECK: supple, no masses felt LYMPH:  no palpable lymphadenopathy in the cervical, axillary or inguinal regions LUNGS: clear to auscultation and  No wheeze or crackles HEART/CVS: regular rate & rhythm and no murmurs; No lower extremity edema ABDOMEN: abdomen soft, non-tender and normal bowel sounds Musculoskeletal:no cyanosis of digits and no clubbing  PSYCH: alert & oriented x 3 with fluent speech NEURO: no focal motor/sensory deficits SKIN: Faint rash on her torso/   LABORATORY DATA:  I have reviewed the data as listed Lab Results  Component Value Date   WBC 6.3 06/23/2017   HGB 9.2 (L) 06/23/2017   HCT 27.5 (L) 06/23/2017   MCV 98.1 06/23/2017   PLT 262 06/23/2017   Recent Labs    06/09/17 0900 06/15/17 0818 06/23/17 0928  NA 137 139 139  K 3.2* 3.8 3.3*  CL 107 107 106   CO2 21* 22 23  GLUCOSE 86 91 108*  BUN '11 16 9  ' CREATININE 0.82 0.85 0.79  CALCIUM 8.1* 8.9 8.3*  GFRNONAA >60 >60 >60  GFRAA >60 >60 >60  PROT 8.3* 8.1 8.3*  ALBUMIN 3.3* 3.3* 3.5  AST '29 17 25  ' ALT '19 15 14  ' ALKPHOS 56 57 59  BILITOT 0.3 0.3 0.3    RADIOGRAPHIC STUDIES: I have personally reviewed the radiological images as listed and agreed with the findings in the report. No results found. IMPRESSION: 1. There is an FDG avid lytic lesion in the right posterior element of L3 consistent with myelomatous involvement. More mild uptake without CT correlate in L5 is nonspecific. Recommend attention on follow-up. No other bony involvement identified. 2. Atherosclerotic changes seen in the aorta. Coronary artery calcifications. 3. Nodular uptake in the skin and subcutaneous tissues  of the right pelvis, likely an injection site. Recommend clinical correlation to exclude a skin lesion. Aortic Atherosclerosis (ICD10-I70.0).   Electronically Signed   By: Dorise Bullion III M.D   On: 04/21/2017 15:09  -------------------------- 05/12/2017  Results for Brandi [name], Brandi [name] (MRN [id number]) as of 05/29/2017 12:07  Ref. Range 04/06/2017 14:12 04/10/2017 11:17 04/29/2017 10:10 04/29/2017 10:16 05/26/2017 08:30  Kappa free light chain Latest Ref Range: 3.3 - 19.4 mg/L 88.6 (H)   56.9 (H) 38.6 (H)  Lamda free light chains Latest Ref Range: 5.7 - 26.3 mg/L 1,298.8 (H)   828.9 (H) 944.2 (H)  Kappa, lamda light chain ratio Latest Ref Range: 0.26 - 1.65  0.07 (L)   0.07 (L) 0.04 (L)  M Protein SerPl Elph-Mcnc Latest Ref Range: Not Observed g/dL   3.2 (H)  2.9 (H)     ASSESSMENT & PLAN:   Multiple myeloma in relapse (HCC) # Multiple myeloma-stage III. New diagnosis IgA lambda [sep 5993]; cytogenetics/fish- 11:14-STANDARD/? High RISK]; currently status post cycle #2 of RVD.  Lambda light chains improved; however M protein-sub-optimal response [2.5 g-to 3.2 g to 2.9 ]; however significant  improvement of the renal function. Myeloma number from today pending.   #Patient seems to have had significant clinical response with resolution of renal failure; however M protein/lambda light chain- partial response [C labs above]. Will monitor patient closely- if she has poor response to therapy/ increasing M protein/lambda light chain- recommend switching to KRD.   #For now continue with Revlimid 20 mg a day [2 weeks on 1 week off]; weekly Velcade; weekly dexamethasone 40 mg a day.  # skin rash-likely secondary to Revlimid. Improved.  # Acute renal failure [creat 7.5 at presentation]- secondary to multiple myeloma; Proteinuria [1.4 g/day]. Resolved.   # right hip pain- radiating to right LE- if worse to inform us; will get MRI lumbar spine/Mri pelvis. ? RT. Not on pain meds.   # Skeletal lesion- on  X [place]- proceed today ca- 8.6 recommend ca+vit D  # Infectious prophylaxis- continue acyclovir/ DVT prohylaxis- on asprin 325 mg/day.   # weekly valcade; follow up with me in 2 weeks/labs/velcade.   # awaiting on multiple myeloma panel/kappa lambda light chain ratio;  If not improving-question switch to KRD.   Dec 20th/[facility] appt-transplant eval.  All questions were answered. The patient knows to call the clinic with any problems, questions or concerns.    [name], MD 06/23/2017 5:59 PM

## 2017-06-27 LAB — MULTIPLE MYELOMA PANEL, SERUM
ALBUMIN/GLOB SERPL: 0.8 (ref 0.7–1.7)
ALPHA2 GLOB SERPL ELPH-MCNC: 0.7 g/dL (ref 0.4–1.0)
Albumin SerPl Elph-Mcnc: 3.3 g/dL (ref 2.9–4.4)
Alpha 1: 0.1 g/dL (ref 0.0–0.4)
B-GLOBULIN SERPL ELPH-MCNC: 3.3 g/dL — AB (ref 0.7–1.3)
GAMMA GLOB SERPL ELPH-MCNC: 0.4 g/dL (ref 0.4–1.8)
GLOBULIN, TOTAL: 4.6 g/dL — AB (ref 2.2–3.9)
IGG (IMMUNOGLOBIN G), SERUM: 581 mg/dL — AB (ref 700–1600)
IgA: 1640 mg/dL — ABNORMAL HIGH (ref 87–352)
IgM (Immunoglobulin M), Srm: 11 mg/dL — ABNORMAL LOW (ref 26–217)
M PROTEIN SERPL ELPH-MCNC: 2.2 g/dL — AB
Total Protein ELP: 7.9 g/dL (ref 6.0–8.5)

## 2017-06-27 LAB — KAPPA/LAMBDA LIGHT CHAINS
KAPPA, LAMDA LIGHT CHAIN RATIO: 0.05 — AB (ref 0.26–1.65)
Kappa free light chain: 33.7 mg/L — ABNORMAL HIGH (ref 3.3–19.4)
LAMDA FREE LIGHT CHAINS: 739.8 mg/L — AB (ref 5.7–26.3)

## 2017-06-30 ENCOUNTER — Inpatient Hospital Stay: Payer: Medicare Other | Attending: Internal Medicine

## 2017-06-30 VITALS — BP 121/71 | HR 64 | Resp 20

## 2017-06-30 DIAGNOSIS — Z79899 Other long term (current) drug therapy: Secondary | ICD-10-CM | POA: Diagnosis not present

## 2017-06-30 DIAGNOSIS — M899 Disorder of bone, unspecified: Secondary | ICD-10-CM | POA: Insufficient documentation

## 2017-06-30 DIAGNOSIS — Z9181 History of falling: Secondary | ICD-10-CM | POA: Insufficient documentation

## 2017-06-30 DIAGNOSIS — R42 Dizziness and giddiness: Secondary | ICD-10-CM | POA: Insufficient documentation

## 2017-06-30 DIAGNOSIS — I2510 Atherosclerotic heart disease of native coronary artery without angina pectoris: Secondary | ICD-10-CM | POA: Insufficient documentation

## 2017-06-30 DIAGNOSIS — J189 Pneumonia, unspecified organism: Secondary | ICD-10-CM | POA: Insufficient documentation

## 2017-06-30 DIAGNOSIS — M25551 Pain in right hip: Secondary | ICD-10-CM | POA: Diagnosis not present

## 2017-06-30 DIAGNOSIS — F17210 Nicotine dependence, cigarettes, uncomplicated: Secondary | ICD-10-CM | POA: Diagnosis not present

## 2017-06-30 DIAGNOSIS — Z5111 Encounter for antineoplastic chemotherapy: Secondary | ICD-10-CM | POA: Insufficient documentation

## 2017-06-30 DIAGNOSIS — I10 Essential (primary) hypertension: Secondary | ICD-10-CM | POA: Diagnosis not present

## 2017-06-30 DIAGNOSIS — Z7982 Long term (current) use of aspirin: Secondary | ICD-10-CM | POA: Insufficient documentation

## 2017-06-30 DIAGNOSIS — C9002 Multiple myeloma in relapse: Secondary | ICD-10-CM

## 2017-06-30 DIAGNOSIS — I700 Atherosclerosis of aorta: Secondary | ICD-10-CM | POA: Diagnosis not present

## 2017-06-30 MED ORDER — DEXAMETHASONE 4 MG PO TABS
40.0000 mg | ORAL_TABLET | Freq: Once | ORAL | Status: AC
  Administered 2017-06-30: 40 mg via ORAL
  Filled 2017-06-30: qty 10

## 2017-06-30 MED ORDER — PROCHLORPERAZINE MALEATE 10 MG PO TABS
10.0000 mg | ORAL_TABLET | Freq: Once | ORAL | Status: AC
Start: 2017-06-30 — End: 2017-06-30
  Administered 2017-06-30: 10 mg via ORAL
  Filled 2017-06-30: qty 1

## 2017-06-30 MED ORDER — BORTEZOMIB CHEMO SQ INJECTION 3.5 MG (2.5MG/ML)
1.3000 mg/m2 | Freq: Once | INTRAMUSCULAR | Status: AC
  Administered 2017-06-30: 2.25 mg via SUBCUTANEOUS
  Filled 2017-06-30: qty 2.25

## 2017-07-04 ENCOUNTER — Other Ambulatory Visit: Payer: Self-pay | Admitting: Internal Medicine

## 2017-07-04 DIAGNOSIS — C9002 Multiple myeloma in relapse: Secondary | ICD-10-CM

## 2017-07-07 ENCOUNTER — Inpatient Hospital Stay: Payer: Medicare Other

## 2017-07-07 ENCOUNTER — Inpatient Hospital Stay (HOSPITAL_BASED_OUTPATIENT_CLINIC_OR_DEPARTMENT_OTHER): Payer: Medicare Other | Admitting: Internal Medicine

## 2017-07-07 VITALS — BP 121/78 | HR 63 | Temp 98.1°F | Resp 16 | Wt 144.6 lb

## 2017-07-07 DIAGNOSIS — I700 Atherosclerosis of aorta: Secondary | ICD-10-CM

## 2017-07-07 DIAGNOSIS — M899 Disorder of bone, unspecified: Secondary | ICD-10-CM | POA: Diagnosis not present

## 2017-07-07 DIAGNOSIS — R42 Dizziness and giddiness: Secondary | ICD-10-CM

## 2017-07-07 DIAGNOSIS — C9002 Multiple myeloma in relapse: Secondary | ICD-10-CM

## 2017-07-07 DIAGNOSIS — M25551 Pain in right hip: Secondary | ICD-10-CM

## 2017-07-07 DIAGNOSIS — I2510 Atherosclerotic heart disease of native coronary artery without angina pectoris: Secondary | ICD-10-CM

## 2017-07-07 DIAGNOSIS — J189 Pneumonia, unspecified organism: Secondary | ICD-10-CM | POA: Diagnosis not present

## 2017-07-07 DIAGNOSIS — Z79899 Other long term (current) drug therapy: Secondary | ICD-10-CM

## 2017-07-07 DIAGNOSIS — F17210 Nicotine dependence, cigarettes, uncomplicated: Secondary | ICD-10-CM

## 2017-07-07 DIAGNOSIS — Z9181 History of falling: Secondary | ICD-10-CM | POA: Diagnosis not present

## 2017-07-07 DIAGNOSIS — Z5111 Encounter for antineoplastic chemotherapy: Secondary | ICD-10-CM | POA: Diagnosis not present

## 2017-07-07 LAB — CBC WITH DIFFERENTIAL/PLATELET
BASOS PCT: 0 %
Basophils Absolute: 0 10*3/uL (ref 0–0.1)
Eosinophils Absolute: 0.2 10*3/uL (ref 0–0.7)
Eosinophils Relative: 4 %
HEMATOCRIT: 31.6 % — AB (ref 35.0–47.0)
HEMOGLOBIN: 10.5 g/dL — AB (ref 12.0–16.0)
LYMPHS ABS: 3 10*3/uL (ref 1.0–3.6)
LYMPHS PCT: 54 %
MCH: 32.3 pg (ref 26.0–34.0)
MCHC: 33.3 g/dL (ref 32.0–36.0)
MCV: 97.3 fL (ref 80.0–100.0)
MONOS PCT: 8 %
Monocytes Absolute: 0.4 10*3/uL (ref 0.2–0.9)
NEUTROS ABS: 1.8 10*3/uL (ref 1.4–6.5)
NEUTROS PCT: 34 %
Platelets: 199 10*3/uL (ref 150–440)
RBC: 3.25 MIL/uL — ABNORMAL LOW (ref 3.80–5.20)
RDW: 15.8 % — ABNORMAL HIGH (ref 11.5–14.5)
WBC: 5.4 10*3/uL (ref 3.6–11.0)

## 2017-07-07 LAB — COMPREHENSIVE METABOLIC PANEL
ALT: 10 U/L — ABNORMAL LOW (ref 14–54)
ANION GAP: 10 (ref 5–15)
AST: 17 U/L (ref 15–41)
Albumin: 3.7 g/dL (ref 3.5–5.0)
Alkaline Phosphatase: 44 U/L (ref 38–126)
BUN: 14 mg/dL (ref 6–20)
CALCIUM: 8.9 mg/dL (ref 8.9–10.3)
CHLORIDE: 104 mmol/L (ref 101–111)
CO2: 24 mmol/L (ref 22–32)
Creatinine, Ser: 0.79 mg/dL (ref 0.44–1.00)
GFR calc non Af Amer: >60 mL/min (ref >60)
Glucose, Bld: 113 mg/dL — ABNORMAL HIGH (ref 65–99)
POTASSIUM: 3.7 mmol/L (ref 3.5–5.1)
Sodium: 138 mmol/L (ref 135–145)
Total Bilirubin: 0.4 mg/dL (ref 0.3–1.2)
Total Protein: 8.7 g/dL — ABNORMAL HIGH (ref 6.5–8.1)

## 2017-07-07 MED ORDER — BORTEZOMIB CHEMO SQ INJECTION 3.5 MG (2.5MG/ML)
1.3000 mg/m2 | Freq: Once | INTRAMUSCULAR | Status: AC
  Administered 2017-07-07: 2.25 mg via SUBCUTANEOUS
  Filled 2017-07-07: qty 2.25

## 2017-07-07 MED ORDER — PROCHLORPERAZINE MALEATE 10 MG PO TABS
10.0000 mg | ORAL_TABLET | Freq: Once | ORAL | Status: AC
  Administered 2017-07-07: 10 mg via ORAL
  Filled 2017-07-07: qty 1

## 2017-07-07 MED ORDER — DEXAMETHASONE 4 MG PO TABS
40.0000 mg | ORAL_TABLET | Freq: Once | ORAL | Status: AC
Start: 2017-07-07 — End: 2017-07-07
  Administered 2017-07-07: 40 mg via ORAL
  Filled 2017-07-07: qty 10

## 2017-07-07 NOTE — Progress Notes (Signed)
[place] NOTE  Patient Care Team: Center, [facility] as PCP - General (General Practice)  CHIEF COMPLAINTS/PURPOSE OF CONSULTATION: Multiple myeloma Multiple myeloma.  #  Oncology History   # SEP 2018- MULTIPLE MYELOMA IgALamda [2.5 gm/dl; K/L= 88/1298]; STAGE III [beta 2 microglobulin=5.5] [presented with acute renal failure; anemia; NO hypercalcemia; Skeletal survey-Normal]; BMBx- 45% plasma cells; FISH-POSITIVE 11:14 translocation.[STANDARD-high RISK]/cyto-Normal; SEP 2018- PET- L3 posterior element lesion.   # 9/14- velcade SQ twice weekly/Dex 40 mg/week; OCT 5th 2018-Start R [54m]VD; #2- Rev [212m1/01]   # Acute renal failure [Dr.Singh; Proteinuria 1.5gm/day ]; acyclovir/Asprin     Multiple myeloma in relapse (HCC)     HISTORY OF PRESENTING ILLNESS:  Brandi COREY89.o.  female recently diagnosed multiple myeloma-on Revlimid is here for follow-up.  Patient states that she had a fall x2 when she started to stand up all of a sudden.  She felt lightheaded and dizzy.  She did not hit head; denies significant trauma.  Denies any significant tingling and numbness of the extremities-except for mild tingling and numbness in the right foot.. No nausea no vomiting. No difficult swallowing.  Otherwise no chest pain or shortness of breath.  Skin rashes resolved.  ROS: A complete 10 point review of system is done which is negative except mentioned above in history of present illness  MEDICAL HISTORY:  No past medical history on file.  SURGICAL HISTORY: No past surgical history on file.  SOCIAL HISTORY:  [place] Her daughter lives close by. She smokes about 4-5 cigarettes a day. No alcohol. She used to work in a [employer]more[employer]Currently retired.  Social History   Socioeconomic History  . Marital status: Single    Spouse name: Not on file  . Number of children: Not on file  . Years of education: Not on file  .  Highest education level: Not on file  Social Needs  . Financial resource strain: Not on file  . Food insecurity - worry: Not on file  . Food insecurity - inability: Not on file  . Transportation needs - medical: Not on file  . Transportation needs - non-medical: Not on file  Occupational History  . Not on file  Tobacco Use  . Smoking status: Current Every Day Smoker    Packs/day: 0.50    Types: Cigarettes  . Smokeless tobacco: Never Used  Substance and Sexual Activity  . Alcohol use: No  . Drug use: No  . Sexual activity: Not on file  Other Topics Concern  . Not on file  Social History Narrative   Lives at home with family. Independent at baseline    FAMILY HISTORY: Family History  Problem Relation Age of Onset  . Pneumonia Mother   . Seizures Father     ALLERGIES:  has No Known Allergies.  MEDICATIONS:  Current Outpatient Medications  Medication Sig Dispense Refill  . acetaminophen (TYLENOL) 325 MG tablet Take 1 tablet (325 mg total) by mouth every 6 (six) hours as needed for mild pain (or Fever >/= 101).    . Marland Kitchencyclovir (ZOVIRAX) 400 MG tablet One pill a day [to prevent shingles] 30 tablet 3  . amLODipine (NORVASC) 10 MG tablet Take 1 tablet (10 mg total) by mouth daily. 30 tablet 0  . dexamethasone (DECADRON) 4 MG tablet Take 10 tablets (40 mg total) by mouth once a week. 10 pills in AM with food. 40 tablet 4  . dronabinol (MARINOL) 5  MG capsule Take 1 capsule (5 mg total) by mouth 2 (two) times daily before lunch and supper. 60 capsule 3  . feeding supplement, ENSURE ENLIVE, (ENSURE ENLIVE) LIQD Take 237 mLs by mouth 2 (two) times daily between meals. 60 Bottle 0  . pantoprazole (PROTONIX) 40 MG tablet Take 1 tablet (40 mg total) by mouth daily.    . polyethylene glycol (MIRALAX / GLYCOLAX) packet Take 17 g by mouth daily as needed for mild constipation. 14 each 0  . potassium chloride SA (K-DUR,KLOR-CON) 20 MEQ tablet Take 1 tablet (20 mEq total) by mouth 2 (two) times  daily. 30 tablet 0  . REVLIMID 20 MG capsule TAKE 1 CAPSULE BY MOUTH EVERY DAY FOR 14 DAYS ON THEN 7 DAYS OFF 14 capsule 0  . temazepam (RESTORIL) 7.5 MG capsule Take 1 capsule (7.5 mg total) by mouth at bedtime. 30 capsule 0  . vitamin B-12 100 MCG tablet Take 1 tablet (100 mcg total) by mouth daily. 30 tablet 0   No current facility-administered medications for this visit.       [name]  PHYSICAL EXAMINATION: ECOG PERFORMANCE STATUS: 1 - Symptomatic but completely ambulatory  Vitals:   07/07/17 1059  BP: 121/78  Pulse: 63  Resp: 16  Temp: 98.1 F (36.7 C)   Filed Weights   07/07/17 1059  Weight: 144 lb 9.6 oz (65.6 kg)    GENERAL: Well-nourished well-developed; Alert, no distress and comfortable. She is accompanied by family. EYES: no pallor or icterus OROPHARYNX: no thrush or ulceration; good dentition  NECK: supple, no masses felt LYMPH:  no palpable lymphadenopathy in the cervical, axillary or inguinal regions LUNGS: clear to auscultation and  No wheeze or crackles HEART/CVS: regular rate & rhythm and no murmurs; No lower extremity edema ABDOMEN: abdomen soft, non-tender and normal bowel sounds Musculoskeletal:no cyanosis of digits and no clubbing  PSYCH: alert & oriented x 3 with fluent speech NEURO: no focal motor/sensory deficits SKIN: Faint rash on her torso/   LABORATORY DATA:  I have reviewed the data as listed Lab Results  Component Value Date   WBC 5.4 07/07/2017   HGB 10.5 (L) 07/07/2017   HCT 31.6 (L) 07/07/2017   MCV 97.3 07/07/2017   PLT 199 07/07/2017   Recent Labs    06/15/17 0818 06/23/17 0928 07/07/17 1022  NA 139 139 138  K 3.8 3.3* 3.7  CL 107 106 104  CO2 _0 GLUCOSE 91 108* 113*  BUN _1 CREATININE 0.85 0.79 0.79  CALCIUM 8.9 8.3* 8.9  GFRNONAA >60 >60 >60  GFRAA >60 >60 >60  PROT 8.1 8.3* 8.7*  ALBUMIN 3.3* 3.5 3.7  AST _2 ALT 15 14 10*  ALKPHOS 57 59 44  BILITOT 0.3 0.3 0.4    RADIOGRAPHIC STUDIES: I have  personally reviewed the radiological images as listed and agreed with the findings in the report. No results found. IMPRESSION: 1. There is an FDG avid lytic lesion in the right posterior element of L3 consistent with myelomatous involvement. More mild uptake without CT correlate in L5 is nonspecific. Recommend attention on follow-up. No other bony involvement identified. 2. Atherosclerotic changes seen in the aorta. Coronary artery calcifications. 3. Nodular uptake in the skin and subcutaneous tissues of the right pelvis, likely an injection site. Recommend clinical correlation to exclude a skin lesion. Aortic Atherosclerosis (ICD10-I70.0).   Electronically Signed   By: Dorise Bullion III M.D   On: 04/21/2017 15:09  --------------------------  05/12/2017  Results for MINETTA, KRISHER (MRN [id number]) as of 07/07/2017 21:40  Ref. Range 04/12/2017 13:25 04/15/2017 10:14 04/21/2017 10:21 04/21/2017 12:17 04/22/2017 13:34 04/22/2017 14:15 04/29/2017 10:10 04/29/2017 10:16 05/06/2017 08:40 05/12/2017 10:48 05/19/2017 13:03 05/26/2017 08:30 06/02/2017 09:38 06/09/2017 09:00 06/15/2017 08:18 06/23/2017 09:28 07/07/2017 10:22  Kappa free light chain Latest Ref Range: 3.3 - 19.4 mg/L        56.9 (H)    38.6 (H)    33.7 (H)   Lamda free light chains Latest Ref Range: 5.7 - 26.3 mg/L        828.9 (H)    944.2 (H)    739.8 (H)   Kappa, lamda light chain ratio Latest Ref Range: 0.26 - 1.65         0.07 (L)    0.04 (L)    0.05 (L)   M Protein SerPl Elph-Mcnc Latest Ref Range: Not Observed g/dL       3.2 (H)     2.9 (H)    2.2 (H)       ASSESSMENT & PLAN:   Multiple myeloma in relapse (HCC) # Multiple myeloma-stage III. New diagnosis IgA lambda [sep 5284]; cytogenetics/fish- 11:14-STANDARD/? High RISK]; pretreatment-M protein 3.7; lambda light chain 1298. Currently status post cycle #3 of RVD.    #Partial response noted so far-November 2018 M protein 2.2 g/lambda light chain 739.   #Patient seems  to have had significant clinical response with resolution of renal failure; however M protein/lambda light chain- partial response [C labs above].   #Discussed with patient and daughter that I hope for a better response on current therapy; await evaluation at [facility] as planned next week.  #For now continue with Revlimid 20 mg a day [2 weeks on 1 week off]; weekly Velcade; weekly dexamethasone 40 mg a day.  # right hip pain- radiating to right LE- if worse to inform us; will get MRI lumbar spine/Mri pelvis. ? RT. Not on pain meds.   # Skeletal lesion- on  X [place]- proceed today ca- 8.6 recommend ca+vit D  # Infectious prophylaxis- continue acyclovir/ DVT prohylaxis- on asprin 325 mg/day.   #CBC Velcade on 721; CBC/CMP myeloma panel; kappa lambda X-MD on the 28th  Dec 20th/[facility] app[facility]-transplant eval.  All questions were answered. The patient knows to call the clinic with any problems, questions or concerns.    [name], MD 07/07/2017 9:47 PM

## 2017-07-07 NOTE — Assessment & Plan Note (Addendum)
#  Multiple myeloma-stage III. New diagnosis IgA lambda [sep 4045]; cytogenetics/fish- 11:14-STANDARD/? High RISK]; pretreatment-M protein 3.7; lambda light chain 1298. Currently status post cycle #3 of RVD.    #Partial response noted so far-November 2018 M protein 2.2 g/lambda light chain 739.   #Patient seems to have had significant clinical response with resolution of renal failure; however M protein/lambda light chain- partial response [C labs above].   #Discussed with patient and daughter that I hope for a better response on current therapy; await evaluation at [facility] as planned next week.  #For now continue with Revlimid 20 mg a day [2 weeks on 1 week off]; weekly Velcade; weekly dexamethasone 40 mg a day.  # right hip pain- radiating to right LE- if worse to inform us; will get MRI lumbar spine/Mri pelvis. ? RT. Not on pain meds.   # Skeletal lesion- on  X [place]- proceed today ca- 8.6 recommend ca+vit D  # Infectious prophylaxis- continue acyclovir/ DVT prohylaxis- on asprin 325 mg/day.   #CBC Velcade on 721; CBC/CMP myeloma panel; kappa lambda X-MD on the 28th  Dec 20th/[facility] app[facility]-transplant eval.

## 2017-07-14 ENCOUNTER — Encounter: Payer: Self-pay | Admitting: Emergency Medicine

## 2017-07-14 ENCOUNTER — Emergency Department
Admission: EM | Admit: 2017-07-14 | Discharge: 2017-07-14 | Disposition: A | Discharge status: Home / Self Care | Payer: Medicare Other | Attending: Emergency Medicine | Admitting: Emergency Medicine

## 2017-07-14 ENCOUNTER — Emergency Department: Payer: Medicare Other

## 2017-07-14 ENCOUNTER — Other Ambulatory Visit: Payer: Self-pay

## 2017-07-14 DIAGNOSIS — J189 Pneumonia, unspecified organism: Secondary | ICD-10-CM

## 2017-07-14 DIAGNOSIS — C9000 Multiple myeloma not having achieved remission: Secondary | ICD-10-CM | POA: Diagnosis not present

## 2017-07-14 DIAGNOSIS — J181 Lobar pneumonia, unspecified organism: Secondary | ICD-10-CM | POA: Insufficient documentation

## 2017-07-14 DIAGNOSIS — I10 Essential (primary) hypertension: Secondary | ICD-10-CM | POA: Diagnosis not present

## 2017-07-14 DIAGNOSIS — F17210 Nicotine dependence, cigarettes, uncomplicated: Secondary | ICD-10-CM | POA: Diagnosis not present

## 2017-07-14 DIAGNOSIS — R079 Chest pain, unspecified: Secondary | ICD-10-CM | POA: Diagnosis not present

## 2017-07-14 DIAGNOSIS — I700 Atherosclerosis of aorta: Secondary | ICD-10-CM | POA: Diagnosis not present

## 2017-07-14 DIAGNOSIS — Z79899 Other long term (current) drug therapy: Secondary | ICD-10-CM | POA: Diagnosis not present

## 2017-07-14 HISTORY — DX: Multiple myeloma not having achieved remission: C90.00

## 2017-07-14 HISTORY — DX: Essential (primary) hypertension: I10

## 2017-07-14 LAB — CBC
HEMATOCRIT: 30.3 % — AB (ref 35.0–47.0)
Hemoglobin: 10.4 g/dL — ABNORMAL LOW (ref 12.0–16.0)
MCH: 32.9 pg (ref 26.0–34.0)
MCHC: 34.2 g/dL (ref 32.0–36.0)
MCV: 96.1 fL (ref 80.0–100.0)
PLATELETS: 214 10*3/uL (ref 150–440)
RBC: 3.15 MIL/uL — ABNORMAL LOW (ref 3.80–5.20)
RDW: 16.1 % — ABNORMAL HIGH (ref 11.5–14.5)
WBC: 5.6 10*3/uL (ref 3.6–11.0)

## 2017-07-14 LAB — BASIC METABOLIC PANEL
Anion gap: 10 (ref 5–15)
BUN: 12 mg/dL (ref 6–20)
CHLORIDE: 104 mmol/L (ref 101–111)
CO2: 22 mmol/L (ref 22–32)
CREATININE: 0.8 mg/dL (ref 0.44–1.00)
Calcium: 8.7 mg/dL — ABNORMAL LOW (ref 8.9–10.3)
GFR calc Af Amer: >60 mL/min (ref >60)
GFR calc non Af Amer: >60 mL/min (ref >60)
Glucose, Bld: 100 mg/dL — ABNORMAL HIGH (ref 65–99)
POTASSIUM: 3.2 mmol/L — AB (ref 3.5–5.1)
SODIUM: 136 mmol/L (ref 135–145)

## 2017-07-14 LAB — TROPONIN I

## 2017-07-14 MED ORDER — LEVOFLOXACIN 750 MG PO TABS: 750.0000 mg | ORAL_TABLET | Freq: Every day | ORAL | 0 refills | Status: DC

## 2017-07-14 MED ORDER — LEVOFLOXACIN IN D5W 750 MG/150ML IV SOLN
750.0000 mg | Freq: Once | INTRAVENOUS | Status: AC
Start: 2017-07-14 — End: 2017-07-14
  Administered 2017-07-14: 750 mg via INTRAVENOUS
  Filled 2017-07-14: qty 150

## 2017-07-14 MED ORDER — KETOROLAC TROMETHAMINE 30 MG/ML IJ SOLN
30.0000 mg | Freq: Once | INTRAMUSCULAR | Status: AC
  Administered 2017-07-14: 30 mg via INTRAVENOUS
  Filled 2017-07-14: qty 1

## 2017-07-14 MED ORDER — IPRATROPIUM-ALBUTEROL 0.5-2.5 (3) MG/3ML IN SOLN
3.0000 mL | Freq: Once | RESPIRATORY_TRACT | Status: AC
  Administered 2017-07-14: 3 mL via RESPIRATORY_TRACT
  Filled 2017-07-14: qty 3

## 2017-07-14 MED ORDER — IOPAMIDOL (ISOVUE-370) INJECTION 76%
75.0000 mL | Freq: Once | INTRAVENOUS | Status: AC | PRN
  Administered 2017-07-14: 75 mL via INTRAVENOUS

## 2017-07-14 NOTE — ED Notes (Signed)
Pt and family called out stating that pt was NOT having a reaction to antibiotic and that they had previously been told that the rate could be increased so that they could be DC once it's finished. This RN asked Dr. Jimmye Norman about increasing the rate and he said to run full antibiotic over 1 hr so rate increased to 163ml an hour at this time. Will continue to monitor.

## 2017-07-14 NOTE — ED Notes (Signed)
Patient transported to CT 

## 2017-07-14 NOTE — ED Triage Notes (Signed)
Pt here for constant left sided chest pain starting this morning.  Has had cough. No fevers. Is cancer pt receiving chemo shots weekly.  Saw oncologist today and supposed to see PCP tomorrow for chest pain but pain seemed worse.

## 2017-07-14 NOTE — ED Provider Notes (Signed)
[facility] Emergency Department Provider Note   ____________________________________________    I have reviewed the triage vital signs and the nursing notes.   HISTORY  Chief Complaint Chest Pain     HPI Brandi [name] is a 66 y.o. female who presents with complaints of left-sided chest pain.  Patient reports 2 days of chest "aching "in the left side of her chest which is moderate and worsens with deep breath.  She has a history of multiple myeloma and is undergoing treatment at the cancer center.  She denies calf pain or swelling.  No fevers or chills.  Intermittent cough which is productive of white sputum.  Has not taken anything for this.   Past Medical History:  Diagnosis Date  . Hypertension   . Multiple myeloma [facility])     Patient Active Problem List   Diagnosis Date Noted  . Multiple myeloma in relapse ([place]) 04/08/2017  . ARF (acute renal failure) ([place]) 04/05/2017    History reviewed. No pertinent surgical history.  Prior to Admission medications   Medication Sig Start Date End Date Taking? Authorizing Provider  acetaminophen (TYLENOL) 325 MG tablet Take 1 tablet (325 mg total) by mouth every 6 (six) hours as needed for mild pain (or Fever >/= 101). 04/11/17   Nicholes Mango, MD  acyclovir (ZOVIRAX) 400 MG tablet One pill a day [to prevent shingles] 05/12/17   [name], MD  amLODipine (NORVASC) 10 MG tablet Take 1 tablet (10 mg total) by mouth daily. 06/22/17   [name], MD  dexamethasone (DECADRON) 4 MG tablet Take 10 tablets (40 mg total) by mouth once a week. 10 pills in AM with food. 04/15/17   [name], MD  dronabinol (MARINOL) 5 MG capsule Take 1 capsule (5 mg total) by mouth 2 (two) times daily before lunch and supper. 05/12/17   [name], MD  feeding supplement, ENSURE ENLIVE, (ENSURE ENLIVE) LIQD Take 237 mLs by mouth 2 (two) times daily between meals. 04/12/17   Nicholes Mango, MD    levofloxacin (LEVAQUIN) 750 MG tablet Take 1 tablet (750 mg total) by mouth daily for 7 days. 07/14/17 07/21/17  Lavonia Drafts, MD  pantoprazole (PROTONIX) 40 MG tablet Take 1 tablet (40 mg total) by mouth daily. 05/12/17   [name], MD  polyethylene glycol (MIRALAX / GLYCOLAX) packet Take 17 g by mouth daily as needed for mild constipation. 04/11/17   Gouru, Illene Silver, MD  potassium chloride SA (K-DUR,KLOR-CON) 20 MEQ tablet Take 1 tablet (20 mEq total) by mouth 2 (two) times daily. 06/22/17   [name], MD  REVLIMID 20 MG capsule TAKE 1 CAPSULE BY MOUTH EVERY DAY FOR 14 DAYS ON THEN 7 DAYS OFF 07/05/17 07/19/17  [name], MD  temazepam (RESTORIL) 7.5 MG capsule Take 1 capsule (7.5 mg total) by mouth at bedtime. 04/11/17   Nicholes Mango, MD  vitamin B-12 100 MCG tablet Take 1 tablet (100 mcg total) by mouth daily. 04/12/17   Nicholes Mango, MD     Allergies Patient has no known allergies.  Family History  Problem Relation Age of Onset  . Pneumonia Mother   . Seizures Father     Social History Social History   Tobacco Use  . Smoking status: Current Every Day Smoker    Packs/day: 0.50    Types: Cigarettes  . Smokeless tobacco: Never Used  Substance Use Topics  . Alcohol use: No  . Drug use: No  Review of Systems  Constitutional: No fever/chills Eyes: No visual changes.  ENT: No sore throat. Cardiovascular: Chest pain as above Respiratory: As of breath, positive cough Gastrointestinal: No abdominal pain.  No nausea, no vomiting.   Genitourinary: Negative for dysuria. Musculoskeletal: Negative for back pain. Skin: Negative for rash. Neurological: Negative for headaches    ____________________________________________   PHYSICAL EXAM:  VITAL SIGNS: ED Triage Vitals  Enc Vitals Group     BP 07/14/17 1736 131/80     Pulse Rate 07/14/17 1736 98     Resp 07/14/17 1736 18     Temp 07/14/17 1736 99.2 F (37.3 C)     Temp Source 07/14/17  1736 Oral     SpO2 07/14/17 1736 96 %     Weight 07/14/17 1733 66.7 kg (147 lb)     Height 07/14/17 1733 1.575 m (_0 )     Head Circumference --      Peak Flow --      Pain Score 07/14/17 1733 7     Pain Loc --      Pain Edu? --      Excl. in [place]? --     Constitutional: Alert and oriented. No acute distress. Pleasant and interactive  Nose: No congestion/rhinnorhea. Mouth/Throat: Mucous membranes are moist.   Neck:  Painless ROM Cardiovascular: Normal rate, regular rhythm. Grossly normal heart sounds.  Good peripheral circulation.  Chest wall: Mild tenderness to palpation along the ribs just adjacent to the left breast Respiratory: Normal respiratory effort.  No retractions. Lungs CTAB. Gastrointestinal: Soft and nontender. No distention.  No CVA tenderness. Genitourinary: deferred Musculoskeletal: No lower extremity tenderness nor edema.  Warm and well perfused Neurologic:  Normal speech and language. No gross focal neurologic deficits are appreciated.  Skin:  Skin is warm, dry and intact. No rash noted. Psychiatric: Mood and affect are normal. Speech and behavior are normal.  ____________________________________________   LABS (all labs ordered are listed, but only abnormal results are displayed)  Labs Reviewed  BASIC METABOLIC PANEL - Abnormal; Notable for the following components:      Result Value   Potassium 3.2 (*)    Glucose, Bld 100 (*)    Calcium 8.7 (*)    All other components within normal limits  CBC - Abnormal; Notable for the following components:   RBC 3.15 (*)    Hemoglobin 10.4 (*)    HCT 30.3 (*)    RDW 16.1 (*)    All other components within normal limits  TROPONIN I   ____________________________________________  EKG  ED ECG REPORT I, Lavonia Drafts, the attending physician, personally viewed and interpreted this ECG.  Date: 07/14/2017  Rhythm: Sinus tachycardia QRS Axis: normal Intervals: normal ST/T Brandi abnormalities: normal Narrative  Interpretation: no evidence of acute ischemia  ____________________________________________  RADIOLOGY  Chest x-ray shows mild interstitial edema ____________________________________________   PROCEDURES  Procedure(s) performed: No  Procedures   Critical Care performed: No ____________________________________________   INITIAL IMPRESSION / ASSESSMENT AND PLAN / ED COURSE  Pertinent labs & imaging results that were available during my care of the patient were reviewed by me and considered in my medical decision making (see chart for details).  Patient presents with primarily left-sided chest pain with a history of active multiple myeloma.  Differential diagnosis includes chest wall pain, chest wall lesion, pneumonia, bronchitis, interstitial edema, PE  Will treat with DuoNeb, analgesics and obtain CT imaging  ----------------------------------------- 9:02 PM on 07/14/2017 -----------------------------------------  CT demonstrates likely early  pneumonia, no PE.  Will give a dose of IV Levaquin here but given reassuring labs, reassuring vitals and follow-up with her oncologist in 1 day feel she is appropriate for discharge with outpatient antibiotics    ____________________________________________   FINAL CLINICAL IMPRESSION(S) / ED DIAGNOSES  Final diagnoses:  Community acquired pneumonia of left upper lobe of lung ([place])        Note:  This document was prepared using Dragon voice recognition software and may include unintentional dictation errors.    Lavonia Drafts, MD 07/14/17 2103

## 2017-07-14 NOTE — ED Notes (Signed)
Pt states she is still in zero pain, just soreness. Pt A&O. VSS. Will continue to monitor. Pt will be discharged upon completion of antibiotics.

## 2017-07-14 NOTE — ED Notes (Addendum)
This RN unsuccessful x2 to establish IV access. Wells Guiles, RN to attempt.

## 2017-07-15 ENCOUNTER — Inpatient Hospital Stay (HOSPITAL_BASED_OUTPATIENT_CLINIC_OR_DEPARTMENT_OTHER): Payer: Medicare Other | Admitting: Internal Medicine

## 2017-07-15 ENCOUNTER — Other Ambulatory Visit: Payer: Self-pay

## 2017-07-15 ENCOUNTER — Inpatient Hospital Stay: Payer: Medicare Other

## 2017-07-15 ENCOUNTER — Encounter: Payer: Self-pay | Admitting: Nurse Practitioner

## 2017-07-15 DIAGNOSIS — I700 Atherosclerosis of aorta: Secondary | ICD-10-CM | POA: Diagnosis not present

## 2017-07-15 DIAGNOSIS — Z7982 Long term (current) use of aspirin: Secondary | ICD-10-CM | POA: Diagnosis not present

## 2017-07-15 DIAGNOSIS — I10 Essential (primary) hypertension: Secondary | ICD-10-CM

## 2017-07-15 DIAGNOSIS — C9002 Multiple myeloma in relapse: Secondary | ICD-10-CM

## 2017-07-15 DIAGNOSIS — Z79899 Other long term (current) drug therapy: Secondary | ICD-10-CM

## 2017-07-15 DIAGNOSIS — M25551 Pain in right hip: Secondary | ICD-10-CM

## 2017-07-15 DIAGNOSIS — M899 Disorder of bone, unspecified: Secondary | ICD-10-CM

## 2017-07-15 DIAGNOSIS — F17210 Nicotine dependence, cigarettes, uncomplicated: Secondary | ICD-10-CM

## 2017-07-15 DIAGNOSIS — I2510 Atherosclerotic heart disease of native coronary artery without angina pectoris: Secondary | ICD-10-CM

## 2017-07-15 DIAGNOSIS — J189 Pneumonia, unspecified organism: Secondary | ICD-10-CM | POA: Diagnosis not present

## 2017-07-15 DIAGNOSIS — R42 Dizziness and giddiness: Secondary | ICD-10-CM | POA: Diagnosis not present

## 2017-07-15 DIAGNOSIS — Z5111 Encounter for antineoplastic chemotherapy: Secondary | ICD-10-CM | POA: Diagnosis not present

## 2017-07-15 DIAGNOSIS — Z9181 History of falling: Secondary | ICD-10-CM | POA: Diagnosis not present

## 2017-07-15 LAB — COMPREHENSIVE METABOLIC PANEL
ALK PHOS: 48 U/L (ref 38–126)
ALT: 14 U/L (ref 14–54)
AST: 18 U/L (ref 15–41)
Albumin: 3.5 g/dL (ref 3.5–5.0)
Anion gap: 10 (ref 5–15)
BILIRUBIN TOTAL: 0.4 mg/dL (ref 0.3–1.2)
BUN: 14 mg/dL (ref 6–20)
CALCIUM: 8.5 mg/dL — AB (ref 8.9–10.3)
CO2: 22 mmol/L (ref 22–32)
CREATININE: 0.86 mg/dL (ref 0.44–1.00)
Chloride: 100 mmol/L — ABNORMAL LOW (ref 101–111)
GFR calc Af Amer: >60 mL/min (ref >60)
Glucose, Bld: 99 mg/dL (ref 65–99)
Potassium: 3.4 mmol/L — ABNORMAL LOW (ref 3.5–5.1)
Sodium: 132 mmol/L — ABNORMAL LOW (ref 135–145)
TOTAL PROTEIN: 8.7 g/dL — AB (ref 6.5–8.1)

## 2017-07-15 LAB — CBC WITH DIFFERENTIAL/PLATELET
BASOS ABS: 0 10*3/uL (ref 0–0.1)
Basophils Relative: 0 %
Eosinophils Absolute: 0.2 10*3/uL (ref 0–0.7)
Eosinophils Relative: 3 %
HEMATOCRIT: 30.5 % — AB (ref 35.0–47.0)
HEMOGLOBIN: 10.2 g/dL — AB (ref 12.0–16.0)
LYMPHS PCT: 17 %
Lymphs Abs: 1 10*3/uL (ref 1.0–3.6)
MCH: 32.4 pg (ref 26.0–34.0)
MCHC: 33.5 g/dL (ref 32.0–36.0)
MCV: 96.5 fL (ref 80.0–100.0)
MONO ABS: 0.1 10*3/uL — AB (ref 0.2–0.9)
Monocytes Relative: 2 %
NEUTROS ABS: 4.5 10*3/uL (ref 1.4–6.5)
Neutrophils Relative %: 78 %
Platelets: 217 10*3/uL (ref 150–440)
RBC: 3.16 MIL/uL — AB (ref 3.80–5.20)
RDW: 15.5 % — AB (ref 11.5–14.5)
WBC: 5.8 10*3/uL (ref 3.6–11.0)

## 2017-07-15 MED ORDER — LEVOFLOXACIN 750 MG PO TABS: 750.0000 mg | ORAL_TABLET | Freq: Every day | ORAL | 0 refills | Status: AC

## 2017-07-15 NOTE — Patient Instructions (Signed)
Stop Revlimid Stop dexamethasone Hold Velcade today Start Levaquin/antibiotic as recommended

## 2017-07-15 NOTE — Progress Notes (Signed)
Patient presents to clinic for chemotherapy treatments today. She is very weak. She was evaluated by Dr. Mikel Cella team yesterday. She was diagnosed with pneumonia in her left upper lobe. Pt c/o chills. Denies any fevers. C/o left chest discomfort when taking deep breaths. Pt was given an antibiotics for Levaquin 750 mg daily x 7 days but has not started. She request to speak to Dr. Rogue Bussing before starting the antibiotics out of concern of her chemotherapy regimen and renal function. Pt was given iv antibiotics yesterday at Dr. Mikel Cella office per pt's daughter. Pt exposed to pneumonia-daughter's 72 month nephew has pneumonia and spent time with the patient recently. Pt currently taking her revlimid and has 6 days of revilmid cycle. She reports that she broke out in fine raised rash-2 days-took Benadryl 25 mg daily for rash daily. Last Benadryl dose taking this morning. Pt very weak.

## 2017-07-15 NOTE — Assessment & Plan Note (Addendum)
#  Multiple myeloma-stage III. New diagnosis IgA lambda [sep 2426]; cytogenetics/fish- 11:14-STANDARD/? High RISK]; pretreatment-M protein 3.7; lambda light chain 1298. Currently on cycle #4 of RVD; HOLD treatment starting today [see discussion below- Pneumonia]  #ONLY Partial response noted so far-November 2018- M protein 2.2 g/lambda light chain 739; significant clinical response-resolution of kidney failure.  # Will reevaluate for myeloma panel in approximately 1 week; and if myeloma panel still not improving-change to Daratumumab/rev/dex.  Discussed with Dr. Perfecto Kingdom.   # right hip pain- radiating to right LE- if worse to inform us; will get MRI lumbar spine/Mri pelvis. ? RT. Not on pain meds.   #Pneumonia-left upper lobe noted on CT scan; symptomatic chest wall pain; start Levaquin 750 mg once a day for 7 days.  Prescription sent  # Skeletal lesion- on  X [place]- proceed today ca- 8.6 recommend ca+vit D  # Infectious prophylaxis- continue acyclovir/ DVT prohylaxis- on asprin 325 mg/day.   # follow up in 2 weeks/labs.   Addendum: We will order myeloma panel in 1 week

## 2017-07-18 NOTE — Progress Notes (Signed)
[place] NOTE  Patient Care Team: Center, [facility] as PCP - General (General Practice)  CHIEF COMPLAINTS/PURPOSE OF CONSULTATION: Multiple myeloma Multiple myeloma.  #  Oncology History   # SEP 2018- MULTIPLE MYELOMA IgALamda [2.5 gm/dl; K/L= 88/1298]; STAGE III [beta 2 microglobulin=5.5] [presented with acute renal failure; anemia; NO hypercalcemia; Skeletal survey-Normal]; BMBx- 45% plasma cells; FISH-POSITIVE 11:14 translocation.[STANDARD-high RISK]/cyto-Normal; SEP 2018- PET- L3 posterior element lesion.   # 9/14- velcade SQ twice weekly/Dex 40 mg/week; OCT 5th 2018-Start R [54m]VD; #2- Rev [259m1/01]   # Acute renal failure [Dr.Singh; Proteinuria 1.5gm/day ]; acyclovir/Asprin     Multiple myeloma in relapse (HCC)     HISTORY OF PRESENTING ILLNESS:  Brandi BRATCHER89.o.  female recently diagnosed multiple myeloma-on Revlimid is here for follow-up.  [facility].  Yesterday evening patient noted to have worsening chest wall pain left upper lobe worsening shortness of breath low-grade fevers with sweats.  [facility]; however the CTA showed no PE-but showed left upper lobe developing infiltrates suggestive of pneumonia.  No nausea no vomiting. No difficult swallowing.  Otherwise no chest pain or shortness of breath.  No significant skin rash.  ROS: A complete 10 point review of system is done which is negative except mentioned above in history of present illness  MEDICAL HISTORY:  Past Medical History:  Diagnosis Date  . Hypertension   . Multiple myeloma ([place]    SURGICAL HISTORY: History reviewed. No pertinent surgical history.  SOCIAL HISTORY:  [place] Her daughter lives close by. She smokes about 4-5 cigarettes a day. No alcohol. She used to work in a [place]ired.  Social History   Socioeconomic History  . Marital status: Single    Spouse name: Not on file  . Number of children: Not on file  . Years of education: Not on file  . Highest education level: Not on file  Social Needs  . Financial resource strain: Not on file  . Food insecurity - worry: Not on file  . Food insecurity - inability: Not on file  . Transportation needs - medical: Not on file  . Transportation needs - non-medical: Not on file  Occupational History  . Not on file  Tobacco Use  . Smoking status: Current Every Day Smoker    Packs/day: 0.50    Types: Cigarettes  . Smokeless tobacco: Never Used  Substance and Sexual Activity  . Alcohol use: No  . Drug use: No  . Sexual activity: Not on file  Other Topics Concern  . Not on file  Social History Narrative   Lives at home with family. Independent at baseline    FAMILY HISTORY: Family History  Problem Relation Age of Onset  . Pneumonia Mother   . Seizures Father     ALLERGIES:  has No Known Allergies.  MEDICATIONS:  Current Outpatient Medications  Medication Sig Dispense Refill  . amLODipine (NORVASC) 10 MG tablet Take 1 tablet (10 mg total) by mouth daily. 30 tablet 0  . dexamethasone (DECADRON) 4 MG tablet Take 10 tablets (40 mg total) by mouth once a week. 10 pills in AM with food. 40 tablet 4  . dronabinol (MARINOL) 5 MG capsule Take 1 capsule (5 mg total) by mouth 2 (two) times daily before lunch and supper. 60 capsule 3  .  feeding supplement, ENSURE ENLIVE, (ENSURE ENLIVE) LIQD Take 237 mLs by mouth 2 (two) times daily between meals. 60 Bottle 0  . pantoprazole (PROTONIX) 40 MG tablet Take 1 tablet (40 mg total) by mouth daily.    . potassium chloride SA (K-DUR,KLOR-CON) 20 MEQ tablet Take 1 tablet (20 mEq total) by mouth 2 (two) times daily. 30 tablet 0  . vitamin B-12 100 MCG tablet Take 1 tablet (100 mcg total) by mouth daily. 30 tablet 0  . acetaminophen (TYLENOL) 325 MG tablet Take 1 tablet  (325 mg total) by mouth every 6 (six) hours as needed for mild pain (or Fever >/= 101).    [name] acyclovir (ZOVIRAX) 400 MG tablet One pill a day [to prevent shingles] 30 tablet 3  . levofloxacin (LEVAQUIN) 750 MG tablet Take 1 tablet (750 mg total) by mouth daily for 7 days. 7 tablet 0  . polyethylene glycol (MIRALAX / GLYCOLAX) packet Take 17 g by mouth daily as needed for mild constipation. (Patient not taking: Reported on 07/15/2017) 14 each 0  . temazepam (RESTORIL) 7.5 MG capsule Take 1 capsule (7.5 mg total) by mouth at bedtime. (Patient not taking: Reported on 07/15/2017) 30 capsule 0   No current facility-administered medications for this visit.       [name]  PHYSICAL EXAMINATION: ECOG PERFORMANCE STATUS: 1 - Symptomatic but completely ambulatory  Vitals:   07/15/17 1343  BP: 118/77  Pulse: 75  Resp: 20  Temp: 98.3 F (36.8 C)   There were no vitals filed for this visit.  GENERAL: Well-nourished well-developed; Alert, no distress and comfortable. She is accompanied by family. EYES: no pallor or icterus OROPHARYNX: no thrush or ulceration; good dentition  NECK: supple, no masses felt LYMPH:  no palpable lymphadenopathy in the cervical, axillary or inguinal regions LUNGS: clear to auscultation and  No wheeze or crackles HEART/CVS: regular rate & rhythm and no murmurs; No lower extremity edema ABDOMEN: abdomen soft, non-tender and normal bowel sounds Musculoskeletal:no cyanosis of digits and no clubbing  PSYCH: alert & oriented x 3 with fluent speech NEURO: no focal motor/sensory deficits SKIN: Faint rash on her torso/   LABORATORY DATA:  I have reviewed the data as listed Lab Results  Component Value Date   WBC 5.8 07/15/2017   HGB 10.2 (L) 07/15/2017   HCT 30.5 (L) 07/15/2017   MCV 96.5 07/15/2017   PLT 217 07/15/2017   Recent Labs    06/23/17 0928 07/07/17 1022 07/14/17 1738 07/15/17 1306  NA 139 138 136 132*  K 3.3* 3.7 3.2* 3.4*  CL 106 104 104 100*  CO2 '23  24 22 22  ' GLUCOSE 108* 113* 100* 99  BUN '9 14 12 14  ' CREATININE 0.79 0.79 0.80 0.86  CALCIUM 8.3* 8.9 8.7* 8.5*  GFRNONAA >60 >60 >60 >60  GFRAA >60 >60 >60 >60  PROT 8.3* 8.7*  --  8.7*  ALBUMIN 3.5 3.7  --  3.5  AST 25 17  --  18  ALT 14 10*  --  14  ALKPHOS 59 44  --  48  BILITOT 0.3 0.4  --  0.4    RADIOGRAPHIC STUDIES: I have personally reviewed the radiological images as listed and agreed with the findings in the report. Dg Chest 2 View  Result Date: 07/14/2017 CLINICAL DATA:  Chest pain beginning this morning radiating to left chest. Currently undergoing chemotherapy for multiple myeloma. EXAM: CHEST  2 VIEW COMPARISON:  04/05/2017 FINDINGS: Lungs are adequately inflated and demonstrate no  focal lobar consolidation or effusion. There is mild prominence of the perihilar markings suggesting mild interstitial edema/congestion. Cardiomediastinal silhouette and remainder of the exam is unchanged. IMPRESSION: Mild prominence of the perihilar markings suggesting mild interstitial edema/congestion. Electronically Signed   By: Marin Olp M.D.   On: 07/14/2017 18:11   Ct Angio Chest Pe W And/or Wo Contrast  Result Date: 07/14/2017 CLINICAL DATA:  Left-sided chest pain beginning this morning. Cough. Undergoing chemotherapy for multiple myeloma since September. EXAM: CT ANGIOGRAPHY CHEST WITH CONTRAST TECHNIQUE: Multidetector CT imaging of the chest was performed using the standard protocol during bolus administration of intravenous contrast. Multiplanar CT image reconstructions and MIPs were obtained to evaluate the vascular anatomy. CONTRAST:  66m ISOVUE-370 IOPAMIDOL (ISOVUE-370) INJECTION 76% COMPARISON:  Chest x-ray 07/14/2017, PET-CT 04/21/2017 and abdominopelvic CT 04/05/2017 FINDINGS: Cardiovascular: Heart is normal size. Mild calcified plaque over the left anterior descending and lateral circumflex coronary arteries. Mild calcified plaque involving the thoracic aorta. No evidence of  pulmonary embolism. Mediastinum/Nodes: There is no evidence of mediastinal or hilar adenopathy. Remaining mediastinal structures are unremarkable. Lungs/Pleura: Lungs are well inflated and demonstrate a new reticulonodular/tree-in-bud pattern of opacification over they posterior left upper lobe likely due to infection and less likely an inflammatory process. No evidence of effusion. Possible mild dependent debris along the right mainstem and lower lobe bronchus which may be due to aspiration. Upper Abdomen: Minimal calcified plaque over the abdominal aorta. Musculoskeletal: Degenerate change of the spine. Review of the MIP images confirms the above findings. IMPRESSION: No evidence of pulmonary embolism. New reticulonodular/tree-in-bud airspace process over the left upper lobe likely infection. Aortic Atherosclerosis (ICD10-I70.0). Minimal atherosclerotic coronary artery disease. Electronically Signed   By: DMarin OlpM.D.   On: 07/14/2017 20:53   IMPRESSION: 1. There is an FDG avid lytic lesion in the right posterior element of L3 consistent with myelomatous involvement. More mild uptake without CT correlate in L5 is nonspecific. Recommend attention on follow-up. No other bony involvement identified. 2. Atherosclerotic changes seen in the aorta. Coronary artery calcifications. 3. Nodular uptake in the skin and subcutaneous tissues of the right pelvis, likely an injection site. Recommend clinical correlation to exclude a skin lesion. Aortic Atherosclerosis (ICD10-I70.0).   Electronically Signed   By: DDorise BullionIII M.D   On: 04/21/2017 15:09  -------------------------- 05/12/2017  Results for JMICHAYLA, MCNEIL(MRN [id number] as of 07/07/2017 21:40  Ref. Range 04/12/2017 13:25 04/15/2017 10:14 04/21/2017 10:21 04/21/2017 12:17 04/22/2017 13:34 04/22/2017 14:15 04/29/2017 10:10 04/29/2017 10:16 05/06/2017 08:40 05/12/2017 10:48 05/19/2017 13:03 05/26/2017 08:30 06/02/2017 09:38 06/09/2017  09:00 06/15/2017 08:18 06/23/2017 09:28 07/07/2017 10:22  Kappa free light chain Latest Ref Range: 3.3 - 19.4 mg/L        56.9 (H)    38.6 (H)    33.7 (H)   Lamda free light chains Latest Ref Range: 5.7 - 26.3 mg/L        828.9 (H)    944.2 (H)    739.8 (H)   Kappa, lamda light chain ratio Latest Ref Range: 0.26 - 1.65         0.07 (L)    0.04 (L)    0.05 (L)   M Protein SerPl Elph-Mcnc Latest Ref Range: Not Observed g/dL       3.2 (H)     2.9 (H)    2.2 (H)       ASSESSMENT & PLAN:   Multiple myeloma in relapse (HCC) # Multiple myeloma-stage III. New diagnosis IgA lambda [  sep 2018]; cytogenetics/fish- 11:14-STANDARD/? High RISK]; pretreatment-M protein 3.7; lambda light chain 1298. Currently on cycle #4 of RVD; HOLD treatment starting today [see discussion below- Pneumonia]  #ONLY Partial response noted so far-November 2018- M protein 2.2 g/lambda light chain 739; significant clinical response-resolution of kidney failure.  # Will reevaluate for myeloma panel in approximately 1 week; and if myeloma panel still not improving-change to Daratumumab/rev/dex.  Discussed with Dr. Perfecto Kingdom.   # right hip pain- radiating to right LE- if worse to inform us; will get MRI lumbar spine/Mri pelvis. ? RT. Not on pain meds.   #Pneumonia-left upper lobe noted on CT scan; symptomatic chest wall pain; start Levaquin 750 mg once a day for 7 days.  Prescription sent  # Skeletal lesion- on  X [place]- proceed today ca- 8.6 recommend ca+vit D  # Infectious prophylaxis- continue acyclovir/ DVT prohylaxis- on asprin 325 mg/day.   # follow up in 2 weeks/labs.   Addendum: We will order myeloma panel in 1 week  All questions were answered. The patient knows to call the clinic with any problems, questions or concerns.    [name], MD 07/22/2017 8:05 AM

## 2017-07-20 ENCOUNTER — Telehealth: Payer: Self-pay | Admitting: Internal Medicine

## 2017-07-20 NOTE — Telephone Encounter (Signed)
Brandi [name], would you mind calling patient and having her come in tomorrow for a lab only visit? Please advise patient to keep all other scheduled appts as directed. Thanks!

## 2017-07-20 NOTE — Telephone Encounter (Signed)
Please have the patient come on this Friday-to have labs done-the myeloma panel; kappa lambda light chain only.  #But keep follow-up as planned on Jan/fourth labs [facility], CMP]; Velcade MD.   Corrin Parker

## 2017-07-22 ENCOUNTER — Inpatient Hospital Stay: Payer: Medicare Other

## 2017-07-22 ENCOUNTER — Other Ambulatory Visit: Payer: Self-pay | Admitting: *Deleted

## 2017-07-22 ENCOUNTER — Inpatient Hospital Stay: Payer: Medicare Other | Admitting: Nurse Practitioner

## 2017-07-22 DIAGNOSIS — Z9181 History of falling: Secondary | ICD-10-CM | POA: Diagnosis not present

## 2017-07-22 DIAGNOSIS — M899 Disorder of bone, unspecified: Secondary | ICD-10-CM | POA: Diagnosis not present

## 2017-07-22 DIAGNOSIS — Z5111 Encounter for antineoplastic chemotherapy: Secondary | ICD-10-CM | POA: Diagnosis not present

## 2017-07-22 DIAGNOSIS — C9002 Multiple myeloma in relapse: Secondary | ICD-10-CM

## 2017-07-22 DIAGNOSIS — R42 Dizziness and giddiness: Secondary | ICD-10-CM | POA: Diagnosis not present

## 2017-07-22 DIAGNOSIS — J189 Pneumonia, unspecified organism: Secondary | ICD-10-CM | POA: Diagnosis not present

## 2017-07-22 LAB — COMPREHENSIVE METABOLIC PANEL
ALT: 9 U/L — ABNORMAL LOW (ref 14–54)
ANION GAP: 11 (ref 5–15)
AST: 16 U/L (ref 15–41)
Albumin: 3.8 g/dL (ref 3.5–5.0)
Alkaline Phosphatase: 49 U/L (ref 38–126)
BUN: 16 mg/dL (ref 6–20)
CHLORIDE: 105 mmol/L (ref 101–111)
CO2: 23 mmol/L (ref 22–32)
Calcium: 9.2 mg/dL (ref 8.9–10.3)
Creatinine, Ser: 0.79 mg/dL (ref 0.44–1.00)
GFR calc Af Amer: >60 mL/min (ref >60)
Glucose, Bld: 106 mg/dL — ABNORMAL HIGH (ref 65–99)
POTASSIUM: 3.1 mmol/L — AB (ref 3.5–5.1)
Sodium: 139 mmol/L (ref 135–145)
Total Bilirubin: 0.3 mg/dL (ref 0.3–1.2)
Total Protein: 8.8 g/dL — ABNORMAL HIGH (ref 6.5–8.1)

## 2017-07-22 LAB — CBC WITH DIFFERENTIAL/PLATELET
BASOS ABS: 0.1 10*3/uL (ref 0–0.1)
BASOS PCT: 1 %
EOS PCT: 3 %
Eosinophils Absolute: 0.2 10*3/uL (ref 0–0.7)
HCT: 31.3 % — ABNORMAL LOW (ref 35.0–47.0)
Hemoglobin: 10.7 g/dL — ABNORMAL LOW (ref 12.0–16.0)
LYMPHS PCT: 37 %
Lymphs Abs: 2 10*3/uL (ref 1.0–3.6)
MCH: 32.3 pg (ref 26.0–34.0)
MCHC: 34 g/dL (ref 32.0–36.0)
MCV: 94.9 fL (ref 80.0–100.0)
MONO ABS: 0.4 10*3/uL (ref 0.2–0.9)
Monocytes Relative: 8 %
Neutro Abs: 2.7 10*3/uL (ref 1.4–6.5)
Neutrophils Relative %: 51 %
PLATELETS: 259 10*3/uL (ref 150–440)
RBC: 3.3 MIL/uL — ABNORMAL LOW (ref 3.80–5.20)
RDW: 15.3 % — AB (ref 11.5–14.5)
WBC: 5.3 10*3/uL (ref 3.6–11.0)

## 2017-07-22 MED ORDER — PANTOPRAZOLE SODIUM 40 MG PO TBEC: 40.0000 mg | DELAYED_RELEASE_TABLET | Freq: Every day | ORAL | Status: DC

## 2017-07-22 MED ORDER — AMLODIPINE BESYLATE 10 MG PO TABS: 10.0000 mg | ORAL_TABLET | Freq: Every day | ORAL | 0 refills | Status: DC

## 2017-07-22 MED ORDER — POTASSIUM CHLORIDE CRYS ER 20 MEQ PO TBCR: 20.0000 meq | EXTENDED_RELEASE_TABLET | Freq: Two times a day (BID) | ORAL | 0 refills | Status: DC

## 2017-07-25 LAB — MULTIPLE MYELOMA PANEL, SERUM
ALBUMIN SERPL ELPH-MCNC: 3.6 g/dL (ref 2.9–4.4)
ALBUMIN/GLOB SERPL: 0.8 (ref 0.7–1.7)
ALPHA 1: 0.1 g/dL (ref 0.0–0.4)
ALPHA2 GLOB SERPL ELPH-MCNC: 0.8 g/dL (ref 0.4–1.0)
B-Globulin SerPl Elph-Mcnc: 0.7 g/dL (ref 0.7–1.3)
GLOBULIN, TOTAL: 4.8 g/dL — AB (ref 2.2–3.9)
Gamma Glob SerPl Elph-Mcnc: 3.1 g/dL — ABNORMAL HIGH (ref 0.4–1.8)
IGA: 1648 mg/dL — AB (ref 87–352)
IGG (IMMUNOGLOBIN G), SERUM: 643 mg/dL — AB (ref 700–1600)
IGM (IMMUNOGLOBULIN M), SRM: 9 mg/dL — AB (ref 26–217)
M Protein SerPl Elph-Mcnc: 2.4 g/dL — ABNORMAL HIGH
Total Protein ELP: 8.4 g/dL (ref 6.0–8.5)

## 2017-07-25 LAB — KAPPA/LAMBDA LIGHT CHAINS
KAPPA, LAMDA LIGHT CHAIN RATIO: 0.04 — AB (ref 0.26–1.65)
Kappa free light chain: 23 mg/L — ABNORMAL HIGH (ref 3.3–19.4)
LAMDA FREE LIGHT CHAINS: 556.6 mg/L — AB (ref 5.7–26.3)

## 2017-07-27 ENCOUNTER — Other Ambulatory Visit: Payer: Self-pay

## 2017-07-27 ENCOUNTER — Other Ambulatory Visit: Payer: Self-pay | Admitting: *Deleted

## 2017-07-27 DIAGNOSIS — C9002 Multiple myeloma in relapse: Secondary | ICD-10-CM

## 2017-07-27 MED ORDER — CYANOCOBALAMIN 100 MCG PO TABS: 100.0000 ug | ORAL_TABLET | Freq: Every day | ORAL | 0 refills | Status: DC

## 2017-07-27 MED ORDER — ACYCLOVIR 400 MG PO TABS: ORAL_TABLET | ORAL | 3 refills | Status: DC

## 2017-07-28 ENCOUNTER — Telehealth: Payer: Self-pay | Admitting: Internal Medicine

## 2017-07-28 NOTE — Telephone Encounter (Signed)
Taken care of

## 2017-07-28 NOTE — Telephone Encounter (Signed)
Please take this pt off Lauren's schedule; add to my schedule at 1:30 [okay to double book]. Thx

## 2017-07-29 ENCOUNTER — Telehealth: Payer: Self-pay | Admitting: *Deleted

## 2017-07-29 ENCOUNTER — Inpatient Hospital Stay: Payer: Medicare Other

## 2017-07-29 ENCOUNTER — Inpatient Hospital Stay (HOSPITAL_BASED_OUTPATIENT_CLINIC_OR_DEPARTMENT_OTHER): Payer: Medicare Other | Admitting: Internal Medicine

## 2017-07-29 ENCOUNTER — Inpatient Hospital Stay: Payer: Medicare Other | Attending: Nurse Practitioner

## 2017-07-29 ENCOUNTER — Ambulatory Visit
Admission: RE | Admit: 2017-07-29 | Discharge: 2017-07-29 | Disposition: A | Discharge status: Home / Self Care | Payer: Medicare Other | Source: Ambulatory Visit | Attending: Internal Medicine | Admitting: Internal Medicine

## 2017-07-29 VITALS — BP 126/83 | HR 88 | Resp 18 | Wt 140.6 lb

## 2017-07-29 DIAGNOSIS — R21 Rash and other nonspecific skin eruption: Secondary | ICD-10-CM | POA: Insufficient documentation

## 2017-07-29 DIAGNOSIS — I10 Essential (primary) hypertension: Secondary | ICD-10-CM

## 2017-07-29 DIAGNOSIS — C9000 Multiple myeloma not having achieved remission: Secondary | ICD-10-CM | POA: Diagnosis not present

## 2017-07-29 DIAGNOSIS — I700 Atherosclerosis of aorta: Secondary | ICD-10-CM | POA: Diagnosis not present

## 2017-07-29 DIAGNOSIS — Z5112 Encounter for antineoplastic immunotherapy: Secondary | ICD-10-CM | POA: Diagnosis not present

## 2017-07-29 DIAGNOSIS — Z7982 Long term (current) use of aspirin: Secondary | ICD-10-CM | POA: Diagnosis not present

## 2017-07-29 DIAGNOSIS — C9002 Multiple myeloma in relapse: Secondary | ICD-10-CM | POA: Insufficient documentation

## 2017-07-29 DIAGNOSIS — M25551 Pain in right hip: Secondary | ICD-10-CM | POA: Diagnosis not present

## 2017-07-29 DIAGNOSIS — R05 Cough: Secondary | ICD-10-CM

## 2017-07-29 DIAGNOSIS — R918 Other nonspecific abnormal finding of lung field: Secondary | ICD-10-CM | POA: Diagnosis not present

## 2017-07-29 DIAGNOSIS — R058 Other specified cough: Secondary | ICD-10-CM

## 2017-07-29 DIAGNOSIS — Z79899 Other long term (current) drug therapy: Secondary | ICD-10-CM

## 2017-07-29 DIAGNOSIS — Z792 Long term (current) use of antibiotics: Secondary | ICD-10-CM | POA: Insufficient documentation

## 2017-07-29 DIAGNOSIS — F17210 Nicotine dependence, cigarettes, uncomplicated: Secondary | ICD-10-CM | POA: Insufficient documentation

## 2017-07-29 DIAGNOSIS — R059 Cough, unspecified: Secondary | ICD-10-CM | POA: Insufficient documentation

## 2017-07-29 DIAGNOSIS — M899 Disorder of bone, unspecified: Secondary | ICD-10-CM | POA: Diagnosis not present

## 2017-07-29 DIAGNOSIS — I2510 Atherosclerotic heart disease of native coronary artery without angina pectoris: Secondary | ICD-10-CM

## 2017-07-29 DIAGNOSIS — J189 Pneumonia, unspecified organism: Secondary | ICD-10-CM | POA: Diagnosis not present

## 2017-07-29 LAB — COMPREHENSIVE METABOLIC PANEL
ALBUMIN: 3.6 g/dL (ref 3.5–5.0)
ALT: 9 U/L — ABNORMAL LOW (ref 14–54)
ANION GAP: 14 (ref 5–15)
AST: 17 U/L (ref 15–41)
Alkaline Phosphatase: 47 U/L (ref 38–126)
BUN: 16 mg/dL (ref 6–20)
CO2: 22 mmol/L (ref 22–32)
Calcium: 9 mg/dL (ref 8.9–10.3)
Chloride: 103 mmol/L (ref 101–111)
Creatinine, Ser: 1 mg/dL (ref 0.44–1.00)
GFR calc Af Amer: >60 mL/min (ref >60)
GFR calc non Af Amer: 58 mL/min — ABNORMAL LOW (ref >60)
GLUCOSE: 100 mg/dL — AB (ref 65–99)
Potassium: 3.7 mmol/L (ref 3.5–5.1)
Sodium: 139 mmol/L (ref 135–145)
TOTAL PROTEIN: 8.8 g/dL — AB (ref 6.5–8.1)

## 2017-07-29 LAB — CBC WITH DIFFERENTIAL/PLATELET
BASOS ABS: 0 10*3/uL (ref 0–0.1)
Basophils Relative: 0 %
Eosinophils Absolute: 0.2 10*3/uL (ref 0–0.7)
Eosinophils Relative: 3 %
HEMATOCRIT: 30.8 % — AB (ref 35.0–47.0)
Hemoglobin: 10.3 g/dL — ABNORMAL LOW (ref 12.0–16.0)
LYMPHS ABS: 2.6 10*3/uL (ref 1.0–3.6)
LYMPHS PCT: 48 %
MCH: 32.1 pg (ref 26.0–34.0)
MCHC: 33.4 g/dL (ref 32.0–36.0)
MCV: 96.1 fL (ref 80.0–100.0)
MONO ABS: 0.3 10*3/uL (ref 0.2–0.9)
Monocytes Relative: 5 %
NEUTROS ABS: 2.4 10*3/uL (ref 1.4–6.5)
Neutrophils Relative %: 44 %
Platelets: 248 10*3/uL (ref 150–440)
RBC: 3.2 MIL/uL — AB (ref 3.80–5.20)
RDW: 15.7 % — AB (ref 11.5–14.5)
WBC: 5.4 10*3/uL (ref 3.6–11.0)

## 2017-07-29 NOTE — Progress Notes (Signed)
DISCONTINUE ON PATHWAY REGIMEN - Multiple Myeloma and Other Plasma Cell Dyscrasias     A cycle is every 21 days:     Bortezomib      Lenalidomide      Dexamethasone   **Always confirm dose/schedule in your pharmacy ordering system**    REASON: Toxicities / Adverse Event PRIOR TREATMENT: [id number]: VRd (Bortezomib 1.3 mg/m2 Subcut D1, 8, 15 + Lenalidomide 25 mg + Dexamethasone 40 mg) q21 Days x 4-6 Cycles Maximum Prior to [facility] [employer] TREATMENT RESPONSE: Partial Response (PR)  START ON PATHWAY REGIMEN - Multiple Myeloma and Other Plasma Cell Dyscrasias     A cycle is every 28 days:     Daratumumab      Lenalidomide      Dexamethasone   **Always confirm dose/schedule in your pharmacy ordering system**    Patient Characteristics: Relapsed / Refractory, All Lines of Therapy R-ISS Staging: III Disease Classification: Refractory Line of Therapy: Second Line Intent of Therapy: Non-Curative / Palliative Intent, Discussed with Patient

## 2017-07-29 NOTE — Telephone Encounter (Signed)
no able to leave vm-pt's vm not set up. Pt no show for chemotherapy appointment today. Unable to reach patient.

## 2017-07-29 NOTE — Assessment & Plan Note (Addendum)
#  Multiple myeloma-stage III. New diagnosis IgA lambda [sep 1856]; cytogenetics/fish- 11:14-STANDARD/? High RISK]; pretreatment-M protein 3.7; lambda light chain 1298. Currently on cycle #4 of RVD; HOLD treatment starting today [see discussion below- Pneumonia]  # ONLY Partial response noted so far-November 2018- M protein 2.2 g/lambda light chain 739; however December 2018-M protein 2.4/increasing; mild improvement of the lambda light chain.  #Recommend proceeding with daratumumab/rev/dex-in approximately 1 week.  Would like to get a better response prior to proceeding with transplant.   Discuss[facility] with Dr. Perfecto Kingdom at [facility].   # Discussed the rational for rationale for change of treatment; discussed the potential infusion reactions.  Would recommend premedication with steroids; Zantac Singulair Tylenol Benadryl.   # right hip pain- radiating to right LE- worsening;  MRI pelvis. ? Need for RT. Not on pain meds.   # Pneumonia-left upper lobe noted on CT scan; symptomatic chest wall pain; start Levaquin 750 mg once a day for 7 days- resolved. Get CXR today.  # Skeletal lesion- on  X [place]- proceed today ca- 8.6 recommend ca+vit D  # Infectious prophylaxis- continue acyclovir/ DVT prohylaxis- on asprin 325 mg/day.   # follow up in 1 week/dara/MD.

## 2017-07-29 NOTE — Progress Notes (Signed)
[place] NOTE  Patient Care Team: [facility], [facility] as PCP - General (General Practice)  CHIEF COMPLAINTS/PURPOSE OF CONSULTATION: Multiple myeloma Multiple myeloma.  #  Oncology History   # SEP 2018- MULTIPLE MYELOMA IgALamda [2.5 gm/dl; K/L= 88/1298]; STAGE III [beta 2 microglobulin=5.5] [presented with acute renal failure; anemia; NO hypercalcemia; Skeletal survey-Normal]; BMBx- 45% plasma cells; FISH-POSITIVE 11:14 translocation.[STANDARD-high RISK]/cyto-Normal; SEP 2018- PET- L3 posterior element lesion.   # 9/14- velcade SQ twice weekly/Dex 40 mg/week; OCT 5th 2018-Start R [60m]VD; #2- Rev [269m1/01]   # Acute renal failure [Dr.Singh; Proteinuria 1.5gm/day ]; acyclovir/Asprin     Multiple myeloma in relapse (HCC)     HISTORY OF PRESENTING ILLNESS:  VaMALARY AYLESWORTH89.o.  female recently diagnosed multiple myeloma-on Revlimid is here for follow-up.  Patient is currently off her treatment for the last 10 days or so-because of diagnosis of pneumonia on the CT scan.  Patient is status post antibiotics including Levaquin-symptoms have resolved.  However she continues to have intermittent short of breath with exertion mild cough.  She also complains of increasing pain in her right hip mostly at nighttime.  Not so much with exertion.  Patient is taking Tylenol for pain.   No nausea no vomiting. No difficult swallowing.  Otherwise no chest pain or shortness of breath.  No significant skin rash.  ROS: A complete 10 point review of system is done which is negative except mentioned above in history of present illness  MEDICAL HISTORY:  Past Medical History:  Diagnosis Date  . Hypertension   . Multiple myeloma (HCC)     SURGICAL HISTORY: No past surgical history on file.  SOCIAL HISTORY:  [place] in [place]. Her daughter lives close by. She smokes about 4-5 cigarettes a day. No alcohol. She used to work in a [employer] Currently retired.  Social History   Socioeconomic History  . Marital status: Single    Spouse name: Not on file  . Number of children: Not on file  . Years of education: Not on file  . Highest education level: Not on file  Social Needs  . Financial resource strain: Not on file  . Food insecurity - worry: Not on file  . Food insecurity - inability: Not on file  . Transportation needs - medical: Not on file  . Transportation needs - non-medical: Not on file  Occupational History  . Not on file  Tobacco Use  . Smoking status: Current Every Day Smoker    Packs/day: 0.50    Types: Cigarettes  . Smokeless tobacco: Never Used  Substance and Sexual Activity  . Alcohol use: No  . Drug use: No  . Sexual activity: Not on file  Other Topics Concern  . Not on file  Social History Narrative   Lives at home with family. Independent at baseline    FAMILY HISTORY: Family History  Problem Relation Age of Onset  . Pneumonia Mother   . Seizures Father     ALLERGIES:  has No Known Allergies.  MEDICATIONS:  Current Outpatient Medications  Medication Sig Dispense Refill  . acetaminophen (TYLENOL) 325 MG tablet Take 1 tablet (325 mg total) by mouth every 6 (six) hours as needed for mild pain (or Fever >/= 101).    . Marland Kitchencyclovir (ZOVIRAX) 400 MG tablet One pill a day [to prevent shingles] 30 tablet 3  . amLODipine (NORVASC) 10 MG tablet Take 1 tablet (10 mg total) by mouth daily.  30 tablet 0  . cyanocobalamin 100 MCG tablet Take 1 tablet (100 mcg total) by mouth daily. 30 tablet 0  . dexamethasone (DECADRON) 4 MG tablet Take 10 tablets (40 mg total) by mouth once a week. 10 pills in AM with food. 40 tablet 4  . dronabinol (MARINOL) 5 MG capsule Take 1 capsule (5 mg total) by mouth 2 (two) times daily before lunch and supper. 60 capsule 3  . feeding supplement, ENSURE ENLIVE, (ENSURE ENLIVE) LIQD Take 237 mLs by mouth 2 (two) times daily between meals. 60 Bottle 0  . pantoprazole (PROTONIX)  40 MG tablet Take 1 tablet (40 mg total) by mouth daily.    . polyethylene glycol (MIRALAX / GLYCOLAX) packet Take 17 g by mouth daily as needed for mild constipation. 14 each 0  . potassium chloride SA (K-DUR,KLOR-CON) 20 MEQ tablet Take 1 tablet (20 mEq total) by mouth 2 (two) times daily. 30 tablet 0  . temazepam (RESTORIL) 7.5 MG capsule Take 1 capsule (7.5 mg total) by mouth at bedtime. 30 capsule 0   No current facility-administered medications for this visit.       [name]  PHYSICAL EXAMINATION: ECOG PERFORMANCE STATUS: 1 - Symptomatic but completely ambulatory  Vitals:   07/29/17 1503  BP: 126/83  Pulse: 88  Resp: 18  SpO2: 98%   Filed Weights   07/29/17 1503  Weight: 140 lb 9.6 oz (63.8 kg)    GENERAL: Well-nourished well-developed; Alert, no distress and comfortable. She is accompanied by family. EYES: no pallor or icterus OROPHARYNX: no thrush or ulceration; good dentition  NECK: supple, no masses felt LYMPH:  no palpable lymphadenopathy in the cervical, axillary or inguinal regions LUNGS: clear to auscultation and  No wheeze or crackles HEART/CVS: regular rate & rhythm and no murmurs; No lower extremity edema ABDOMEN: abdomen soft, non-tender and normal bowel sounds Musculoskeletal:no cyanosis of digits and no clubbing  PSYCH: alert & oriented x 3 with fluent speech NEURO: no focal motor/sensory deficits SKIN: Faint rash on her torso/   LABORATORY DATA:  I have reviewed the data as listed Lab Results  Component Value Date   WBC 5.4 07/29/2017   HGB 10.3 (L) 07/29/2017   HCT 30.8 (L) 07/29/2017   MCV 96.1 07/29/2017   PLT 248 07/29/2017   Recent Labs    07/15/17 1306 07/22/17 1140 07/29/17 1440  NA 132* 139 139  K 3.4* 3.1* 3.7  CL 100* 105 103  CO2 '22 23 22  ' GLUCOSE 99 106* 100*  BUN '14 16 16  ' CREATININE 0.86 0.79 1.00  CALCIUM 8.5* 9.2 9.0  GFRNONAA >60 >60 58*  GFRAA >60 >60 >60  PROT 8.7* 8.8* 8.8*  ALBUMIN 3.5 3.8 3.6  AST '18 16 17  ' ALT 14  9* 9*  ALKPHOS 48 49 47  BILITOT 0.4 0.3 <0.1*    RADIOGRAPHIC STUDIES: I have personally reviewed the radiological images as listed and agreed with the findings in the report. Dg Chest 2 View  Result Date: 07/29/2017 CLINICAL DATA:  Multiple myeloma.  Productive cough. EXAM: CHEST  2 VIEW COMPARISON:  07/14/2017 chest radiograph. FINDINGS: Stable cardiomediastinal silhouette with normal heart size and mildly tortuous atherosclerotic thoracic aorta. No pneumothorax. No pleural effusion. Minimal new patchy opacity in the lingula. Otherwise no acute pulmonary disease. IMPRESSION: Minimal new patchy opacity in the lingula, cannot exclude developing lingular pneumonia. Recommend follow-up PA and lateral post treatment chest radiographs in 4-6 weeks. Electronically Signed   By: Janina Mayo.D.  On: 07/29/2017 17:16   Dg Chest 2 View  Result Date: 07/14/2017 CLINICAL DATA:  Chest pain beginning this morning radiating to left chest. Currently undergoing chemotherapy for multiple myeloma. EXAM: CHEST  2 VIEW COMPARISON:  04/05/2017 FINDINGS: Lungs are adequately inflated and demonstrate no focal lobar consolidation or effusion. There is mild prominence of the perihilar markings suggesting mild interstitial edema/congestion. Cardiomediastinal silhouette and remainder of the exam is unchanged. IMPRESSION: Mild prominence of the perihilar markings suggesting mild interstitial edema/congestion. Electronically Signed   By: Marin Olp M.D.   On: 07/14/2017 18:11   Ct Angio Chest Pe W And/or Wo Contrast  Result Date: 07/14/2017 CLINICAL DATA:  Left-sided chest pain beginning this morning. Cough. Undergoing chemotherapy for multiple myeloma since September. EXAM: CT ANGIOGRAPHY CHEST WITH CONTRAST TECHNIQUE: Multidetector CT imaging of the chest was performed using the standard protocol during bolus administration of intravenous contrast. Multiplanar CT image reconstructions and MIPs were obtained to  evaluate the vascular anatomy. CONTRAST:  46m ISOVUE-370 IOPAMIDOL (ISOVUE-370) INJECTION 76% COMPARISON:  Chest x-ray 07/14/2017, PET-CT 04/21/2017 and abdominopelvic CT 04/05/2017 FINDINGS: Cardiovascular: Heart is normal size. Mild calcified plaque over the left anterior descending and lateral circumflex coronary arteries. Mild calcified plaque involving the thoracic aorta. No evidence of pulmonary embolism. Mediastinum/Nodes: There is no evidence of mediastinal or hilar adenopathy. Remaining mediastinal structures are unremarkable. Lungs/Pleura: Lungs are well inflated and demonstrate a new reticulonodular/tree-in-bud pattern of opacification over they posterior left upper lobe likely due to infection and less likely an inflammatory process. No evidence of effusion. Possible mild dependent debris along the right mainstem and lower lobe bronchus which may be due to aspiration. Upper Abdomen: Minimal calcified plaque over the abdominal aorta. Musculoskeletal: Degenerate change of the spine. Review of the MIP images confirms the above findings. IMPRESSION: No evidence of pulmonary embolism. New reticulonodular/tree-in-bud airspace process over the left upper lobe likely infection. Aortic Atherosclerosis (ICD10-I70.0). Minimal atherosclerotic coronary artery disease. Electronically Signed   By: DMarin OlpM.D.   On: 07/14/2017 20:53   IMPRESSION: 1. There is an FDG avid lytic lesion in the right posterior element of L3 consistent with myelomatous involvement. More mild uptake without CT correlate in L5 is nonspecific. Recommend attention on follow-up. No other bony involvement identified. 2. Atherosclerotic changes seen in the aorta. Coronary artery calcifications. 3. Nodular uptake in the skin and subcutaneous tissues of the right pelvis, likely an injection site. Recommend clinical correlation to exclude a skin lesion. Aortic Atherosclerosis (ICD10-I70.0).   Electronically Signed   By: DDorise BullionIII M.D   On: 04/21/2017 15:09  -------------------------- 05/12/2017  Results for JROMELIA, BROMELL(MRN [id number] as of 07/07/2017 21:40  Ref. Range 04/12/2017 13:25 04/15/2017 10:14 04/21/2017 10:21 04/21/2017 12:17 04/22/2017 13:34 04/22/2017 14:15 04/29/2017 10:10 04/29/2017 10:16 05/06/2017 08:40 05/12/2017 10:48 05/19/2017 13:03 05/26/2017 08:30 06/02/2017 09:38 06/09/2017 09:00 06/15/2017 08:18 06/23/2017 09:28 07/07/2017 10:22  Kappa free light chain Latest Ref Range: 3.3 - 19.4 mg/L        56.9 (H)    38.6 (H)    33.7 (H)   Lamda free light chains Latest Ref Range: 5.7 - 26.3 mg/L        828.9 (H)    944.2 (H)    739.8 (H)   Kappa, lamda light chain ratio Latest Ref Range: 0.26 - 1.65         0.07 (L)    0.04 (L)    0.05 (L)   M Protein SerPl Elph-Mcnc Latest Ref  Range: Not Observed g/dL       3.2 (H)     2.9 (H)    2.2 (H)       ASSESSMENT & PLAN:   Multiple myeloma in relapse ([place]) # Multiple myeloma-stage III. New diagnosis IgA lambda [sep 2831]; cytogenetics/fish- 11:14-STANDARD/? High RISK]; pretreatment-M protein 3.7; lambda light chain 1298. Currently on cycle #4 of RVD; HOLD treatment starting today [see discussion below- Pneumonia]  # ONLY Partial response noted so far-November 2018- M protein 2.2 g/lambda light chain 739; however December 2018-M protein 2.4/increasing; mild improvement of the lambda light chain.  #Recommend proceeding with daratumumab/rev/dex-in approximately 1 week.  Would like to get a better response prior to proceeding with transplant.   Discussed with Dr. Perfecto Kingdom at [facility].   # Discussed the rational for rationale for change of treatment; discussed the potential infusion reactions.  Would recommend premedication with steroids; Zantac Singulair Tylenol Benadryl.   # right hip pain- radiating to right LE- worsening;  MRI pelvis. ? Need for RT. Not on pain meds.   # Pneumonia-left upper lobe noted on CT scan; symptomatic chest wall pain; start  Levaquin 750 mg once a day for 7 days- resolved. Get CXR today.  # Skeletal lesion- on  X [place]- proceed today ca- 8.6 recommend ca+vit D  # Infectious prophylaxis- continue acyclovir/ DVT prohylaxis- on asprin 325 mg/day.   # follow up in 1 week/dara/MD.   All questions were answered. The patient knows to call the clinic with any problems, questions or concerns.    [name], MD 07/31/2017 7:04 PM

## 2017-08-01 ENCOUNTER — Other Ambulatory Visit: Payer: Self-pay | Admitting: *Deleted

## 2017-08-01 ENCOUNTER — Telehealth: Payer: Self-pay | Admitting: *Deleted

## 2017-08-01 ENCOUNTER — Ambulatory Visit
Admission: RE | Admit: 2017-08-01 | Discharge: 2017-08-01 | Disposition: A | Discharge status: Home / Self Care | Payer: Medicare Other | Source: Ambulatory Visit | Attending: Internal Medicine | Admitting: Internal Medicine

## 2017-08-01 ENCOUNTER — Encounter: Payer: Self-pay | Admitting: *Deleted

## 2017-08-01 DIAGNOSIS — M25451 Effusion, right hip: Secondary | ICD-10-CM | POA: Insufficient documentation

## 2017-08-01 DIAGNOSIS — M25551 Pain in right hip: Secondary | ICD-10-CM

## 2017-08-01 DIAGNOSIS — M899 Disorder of bone, unspecified: Secondary | ICD-10-CM | POA: Diagnosis not present

## 2017-08-01 DIAGNOSIS — C9002 Multiple myeloma in relapse: Secondary | ICD-10-CM | POA: Diagnosis not present

## 2017-08-01 DIAGNOSIS — C9000 Multiple myeloma not having achieved remission: Secondary | ICD-10-CM | POA: Diagnosis not present

## 2017-08-01 MED ORDER — RANITIDINE HCL 150 MG PO TABS: 150.0000 mg | ORAL_TABLET | Freq: Two times a day (BID) | ORAL | Status: DC

## 2017-08-01 MED ORDER — ACETAMINOPHEN 500 MG PO TABS: 500.0000 mg | ORAL_TABLET | Freq: Four times a day (QID) | ORAL | 0 refills | Status: DC | PRN

## 2017-08-01 MED ORDER — MONTELUKAST SODIUM 10 MG PO TABS: 10.0000 mg | ORAL_TABLET | Freq: Every day | ORAL | 3 refills | Status: DC

## 2017-08-01 MED ORDER — GADOBENATE DIMEGLUMINE 529 MG/ML IV SOLN
10.0000 mL | Freq: Once | INTRAVENOUS | Status: AC | PRN
  Administered 2017-08-01: 10 mL via INTRAVENOUS

## 2017-08-01 MED ORDER — LORATADINE 10 MG PO TABS: 10.0000 mg | ORAL_TABLET | Freq: Every day | ORAL | 3 refills | Status: DC

## 2017-08-01 NOTE — Telephone Encounter (Signed)
Contacted patient-pt provided with the following premedication instructions. Pt will take these medications prior to chemotherapy. Teach back process performed. New scripts for singular and loratadine sent to patient's pharmacy. Patient has plenty of decadron and does not need a RF dosing of this medication at this time.  Pt instructed NOT to take her routine dose of 40 mg of Decadron on Saturdays. Teach back process performed.    ranitidine (ZANTAC) 150 MG tablet. Pt instructed to Take 1 tablet (150 mg total) by mouth 2 (two) times daily. Start taking 2 days prior to IV infusion. Take for 4 days total.    montelukast (SINGULAIR) 10 MG tablet.  Take 1 tablet (10 mg total) by mouth at bedtime. Start 2 days prior to infusion. Take it for 4 days total.   loratadine (CLARITIN) 10 MG tablet- Take 1 tablet (10 mg total) by mouth daily. Start 2 days prior to IV infusion. Take for 4 days total.   acetaminophen (TYLENOL) 500 MG tablet. Take 1 tablet by mouth daily. Start 2 days prior to IV infusion. Take for 4 days total.   dexamethasone (DECADRON) 4 MG tablet. Take 1 tablet (in am and 1 tablet in pm).  Start 2 days prior to IV infusion. Take for 4 days total.

## 2017-08-02 ENCOUNTER — Telehealth: Payer: Self-pay | Admitting: *Deleted

## 2017-08-02 NOTE — Telephone Encounter (Signed)
MD aware. He will discuss results with patient at her next scheduled appointment.

## 2017-08-02 NOTE — Telephone Encounter (Signed)
Called Report:  IMPRESSION: 1. Focal, enhancing lesion in the right acetabulum, concerning for myeloma. Small right hip joint effusion is likely reactive. 2. Relatively unchanged enhancing lesion within the right inferior facet of L2, as seen on prior PET-CT, also consistent with myelomatous involvement. 3. Relatively symmetric, heterogeneously enhancing marrow signal throughout the remainder of the pelvis is felt to be related to chemotherapy.  These results will be called to the ordering clinician or representative by the Radiologist Assistant, and communication documented in the PACS or zVision Dashboard.   Electronically Signed   By: Titus Dubin M.D.   On: 08/02/2017 08:56

## 2017-08-04 ENCOUNTER — Inpatient Hospital Stay: Payer: Medicare Other

## 2017-08-04 ENCOUNTER — Other Ambulatory Visit: Payer: Self-pay | Admitting: *Deleted

## 2017-08-04 ENCOUNTER — Inpatient Hospital Stay (HOSPITAL_BASED_OUTPATIENT_CLINIC_OR_DEPARTMENT_OTHER): Payer: Medicare Other | Admitting: Internal Medicine

## 2017-08-04 ENCOUNTER — Other Ambulatory Visit: Payer: Self-pay

## 2017-08-04 VITALS — BP 137/87 | HR 89 | Temp 97.9°F | Resp 20 | Ht 62.0 in | Wt 139.5 lb

## 2017-08-04 DIAGNOSIS — C9002 Multiple myeloma in relapse: Secondary | ICD-10-CM

## 2017-08-04 DIAGNOSIS — M899 Disorder of bone, unspecified: Secondary | ICD-10-CM | POA: Diagnosis not present

## 2017-08-04 DIAGNOSIS — M25551 Pain in right hip: Secondary | ICD-10-CM | POA: Diagnosis not present

## 2017-08-04 DIAGNOSIS — Z5112 Encounter for antineoplastic immunotherapy: Secondary | ICD-10-CM | POA: Diagnosis not present

## 2017-08-04 DIAGNOSIS — F17210 Nicotine dependence, cigarettes, uncomplicated: Secondary | ICD-10-CM | POA: Diagnosis not present

## 2017-08-04 DIAGNOSIS — J189 Pneumonia, unspecified organism: Secondary | ICD-10-CM | POA: Diagnosis not present

## 2017-08-04 LAB — COMPREHENSIVE METABOLIC PANEL
ALT: 12 U/L — AB (ref 14–54)
AST: 21 U/L (ref 15–41)
Albumin: 3.7 g/dL (ref 3.5–5.0)
Alkaline Phosphatase: 45 U/L (ref 38–126)
Anion gap: 10 (ref 5–15)
BUN: 18 mg/dL (ref 6–20)
CHLORIDE: 104 mmol/L (ref 101–111)
CO2: 22 mmol/L (ref 22–32)
CREATININE: 0.86 mg/dL (ref 0.44–1.00)
Calcium: 9.4 mg/dL (ref 8.9–10.3)
GFR calc Af Amer: >60 mL/min (ref >60)
GLUCOSE: 108 mg/dL — AB (ref 65–99)
Potassium: 4.1 mmol/L (ref 3.5–5.1)
Sodium: 136 mmol/L (ref 135–145)
Total Bilirubin: 0.3 mg/dL (ref 0.3–1.2)
Total Protein: 9 g/dL — ABNORMAL HIGH (ref 6.5–8.1)

## 2017-08-04 LAB — CBC WITH DIFFERENTIAL/PLATELET
Basophils Absolute: 0 10*3/uL (ref 0–0.1)
Basophils Relative: 1 %
EOS PCT: 2 %
Eosinophils Absolute: 0.1 10*3/uL (ref 0–0.7)
HCT: 29.9 % — ABNORMAL LOW (ref 35.0–47.0)
Hemoglobin: 10.1 g/dL — ABNORMAL LOW (ref 12.0–16.0)
LYMPHS ABS: 2.4 10*3/uL (ref 1.0–3.6)
Lymphocytes Relative: 47 %
MCH: 32.2 pg (ref 26.0–34.0)
MCHC: 33.8 g/dL (ref 32.0–36.0)
MCV: 95.3 fL (ref 80.0–100.0)
MONO ABS: 0.2 10*3/uL (ref 0.2–0.9)
MONOS PCT: 5 %
Neutro Abs: 2.3 10*3/uL (ref 1.4–6.5)
Neutrophils Relative %: 45 %
PLATELETS: 249 10*3/uL (ref 150–440)
RBC: 3.14 MIL/uL — AB (ref 3.80–5.20)
RDW: 15.6 % — AB (ref 11.5–14.5)
WBC: 5.1 10*3/uL (ref 3.6–11.0)

## 2017-08-04 MED ORDER — HYDROCODONE-ACETAMINOPHEN 5-325 MG PO TABS: 1.0000 | ORAL_TABLET | Freq: Three times a day (TID) | ORAL | 0 refills | Status: DC | PRN

## 2017-08-04 NOTE — Assessment & Plan Note (Addendum)
#  Multiple myeloma-stage III. New diagnosis IgA lambda [sep 1275]; cytogenetics/fish- 11:14-STANDARD/? High RISK]; pretreatment-M protein 3.7; lambda light chain 1298. Currently on cycle #4 of RVD.  # ONLY Partial response noted so far-November 2018- M protein 2.2 g/lambda light chain 739; however December 2018-M protein 2.4/increasing; mild improvement of the lambda light chain.  #Recommend proceeding with daratumumab/rev/dex-in approximately 1 week.  Would like to get a better response prior to proceeding with transplant.   Discussed with Dr. Perfecto Kingdom at [facility].   # Discussed the rational for rationale for change of treatment; discussed the potential infusion reactions.  Would recommend premedication with steroids; Zantac Singulair Tylenol Benadryl.   # right hip pain- radiating to right LE- worsening;  MRI pelvis. ? Need for RT. Not on pain meds.  Will discuss with radiation oncology.  # Pneumonia-left upper lobe noted on CT scan; symptomatic chest wall pain; start Levaquin 750 mg once a day for 7 days- resolved. Get CXR today.  # Skeletal lesion- on  X [place]- proceed today ca- 8.6 recommend ca+vit D  # Infectious prophylaxis- continue acyclovir/ DVT prohylaxis- on asprin 325 mg/day.   #Follow-up in 1 week for dara infusion follow-up with me in 2 weeks infusion labs.

## 2017-08-04 NOTE — Patient Instructions (Addendum)
1. Starting today, please start taking Revlimid 20 mg 1 tablet daily x 14 days.  2. Please continue taking your premedications as directed prior to your chemotherapy (Daratumumab) infusion tomorrow.  For your reference, the schedule is below:  ranitidine (ZANTAC) 150 MG tablet. Pt instructed to Take 1 tablet (150 mg total) by mouth 2 (two) times daily. Start taking 2 days prior to IV infusion. Take for 4 days total.   montelukast (SINGULAIR) 10 MG tablet. Take 1 tablet (10 mg total) by mouth at bedtime. Start 2 days prior to infusion. Take it for 4 days total.  loratadine (CLARITIN) 10 MG tablet- Take 1 tablet (10 mg total) by mouth daily. Start 2 days prior to IV infusion. Take for 4 days total.  acetaminophen (TYLENOL) 500 MG tablet. Take 1 tablet by mouth daily. Start 2 days prior to IV infusion. Take for 4 days total.  dexamethasone (DECADRON) 4 MG tablet. Take 1 tablet (in am and 1 tablet in pm). Start 2 days prior to IV infusion. Take for 4 days total.     3.The Norco (NEW) (pain medication) may cause consitipation. Please use your laxatives as directed to prevention consitipation.    Acetaminophen; Hydrocodone tablets or capsules What is this medicine? ACETAMINOPHEN; HYDROCODONE (a set a MEE noe fen; hye droe KOE done) is a pain reliever. It is used to treat moderate to severe pain. This medicine may be used for other purposes; ask your health care provider or pharmacist if you have questions. COMMON BRAND NAME(S): Anexsia, Bancap HC, Ceta-Plus, Co-Gesic, Comfortpak, Dolagesic, [employer], DuoCet, Hydrocet, Hydrogesic, [place], Lorcet HD, Lorcet Plus, Lortab, Margesic H, Maxidone, [place], Polygesic, [place], [place], [place], Vicodin, Vicodin ES, Vicodin HP, Charlane Ferretti What should I tell my health care provider before I take this medicine? They need to know if you have any of these conditions: -brain tumor -Crohn's disease, inflammatory bowel disease, or ulcerative  colitis -drug abuse or addiction -head injury -heart or circulation problems -if you often drink alcohol -kidney disease or problems going to the bathroom -liver disease -lung disease, asthma, or breathing problems -an unusual or allergic reaction to acetaminophen, hydrocodone, other opioid analgesics, other medicines, foods, dyes, or preservatives -pregnant or trying to get pregnant -breast-feeding How should I use this medicine? Take this medicine by mouth with a glass of water. Follow the directions on the prescription label. You can take it with or without food. If it upsets your stomach, take it with food. Do not take your medicine more often than directed. A special MedGuide will be given to you by the pharmacist with each prescription and refill. Be sure to read this information carefully each time. Talk to your pediatrician regarding the use of this medicine in children. Special care may be needed. Overdosage: If you think you have taken too much of this medicine contact a poison control center or emergency room at once. NOTE: This medicine is only for you. Do not share this medicine with others. What if I miss a dose? If you miss a dose, take it as soon as you can. If it is almost time for your next dose, take only that dose. Do not take double or extra doses. What may interact with this medicine? This medicine may interact with the following medications: -alcohol -antiviral medicines for HIV or AIDS -atropine -antihistamines for allergy, cough and cold -certain antibiotics like erythromycin, clarithromycin -certain medicines for anxiety or sleep -certain medicines for bladder problems like oxybutynin, tolterodine -certain medicines for depression  like amitriptyline, fluoxetine, sertraline -certain medicines for fungal infections like ketoconazole and itraconazole -certain medicines for Parkinson's disease like benztropine, trihexyphenidyl -certain medicines for seizures like  carbamazepine, phenobarbital, phenytoin, primidone -certain medicines for stomach problems like dicyclomine, hyoscyamine -certain medicines for travel sickness like scopolamine -general anesthetics like halothane, isoflurane, methoxyflurane, propofol -ipratropium -local anesthetics like lidocaine, pramoxine, tetracaine -MAOIs like Carbex, Eldepryl, Marplan, Nardil, and Parnate -medicines that relax muscles for surgery -other medicines with acetaminophen -other narcotic medicines for pain or cough -phenothiazines like chlorpromazine, mesoridazine, prochlorperazine, thioridazine -rifampin This list may not describe all possible interactions. Give your health care provider a list of all the medicines, herbs, non-prescription drugs, or dietary supplements you use. Also tell them if you smoke, drink alcohol, or use illegal drugs. Some items may interact with your medicine. What should I watch for while using this medicine? Tell your doctor or health care professional if your pain does not go away, if it gets worse, or if you have new or a different type of pain. You may develop tolerance to the medicine. Tolerance means that you will need a higher dose of the medicine for pain relief. Tolerance is normal and is expected if you take the medicine for a long time. Do not suddenly stop taking your medicine because you may develop a severe reaction. Your body becomes used to the medicine. This does NOT mean you are addicted. Addiction is a behavior related to getting and using a drug for a non-medical reason. If you have pain, you have a medical reason to take pain medicine. Your doctor will tell you how much medicine to take. If your doctor wants you to stop the medicine, the dose will be slowly lowered over time to avoid any side effects. There are different types of narcotic medicines (opiates). If you take more than one type at the same time or if you are taking another medicine that also causes  drowsiness, you may have more side effects. Give your health care provider a list of all medicines you use. Your doctor will tell you how much medicine to take. Do not take more medicine than directed. Call emergency for help if you have problems breathing or unusual sleepiness. Do not take other medicines that contain acetaminophen with this medicine. Always read labels carefully. If you have questions, ask your doctor or pharmacist. If you take too much acetaminophen get medical help right away. Too much acetaminophen can be very dangerous and cause liver damage. Even if you do not have symptoms, it is important to get help right away. You may get drowsy or dizzy. Do not drive, use machinery, or do anything that needs mental alertness until you know how this medicine affects you. Do not stand or sit up quickly, especially if you are an older patient. This reduces the risk of dizzy or fainting spells. Alcohol may interfere with the effect of this medicine. Avoid alcoholic drinks. The medicine will cause constipation. Try to have a bowel movement at least every 2 to 3 days. If you do not have a bowel movement for 3 days, call your doctor or health care professional. Your mouth may get dry. Chewing sugarless gum or sucking hard candy, and drinking plenty of water may help. Contact your doctor if the problem does not go away or is severe. What side effects may I notice from receiving this medicine? Side effects that you should report to your doctor or health care professional as soon as possible: -allergic reactions like skin  rash, itching or hives, swelling of the face, lips, or tongue -breathing problems -confusion -redness, blistering, peeling or loosening of the skin, including inside the mouth -signs and symptoms of low blood pressure like dizziness; feeling faint or lightheaded, falls; unusually weak or tired -trouble passing urine or change in the amount of urine -yellowing of the eyes or  skin Side effects that usually do not require medical attention (report to your doctor or health care professional if they continue or are bothersome): -constipation -dry mouth -nausea, vomiting -tiredness This list may not describe all possible side effects. Call your doctor for medical advice about side effects. You may report side effects to FDA at 1-800-FDA-1088. Where should I keep my medicine? Keep out of the reach of children. This medicine can be abused. Keep your medicine in a safe place to protect it from theft. Do not share this medicine with anyone. Selling or giving away this medicine is dangerous and against the law. This medicine may cause accidental overdose and death if it taken by other adults, children, or pets. Mix any unused medicine with a substance like cat litter or coffee grounds. Then throw the medicine away in a sealed container like a sealed bag or a coffee can with a lid. Do not use the medicine after the expiration date. Store at room temperature between 15 and 30 degrees C (59 and 86 degrees F). NOTE: This sheet is a summary. It may not cover all possible information. If you have questions about this medicine, talk to your doctor, pharmacist, or health care provider.  2018 Elsevier/Gold Standard (2015-04-04 10:02:16)

## 2017-08-04 NOTE — Progress Notes (Signed)
[place] NOTE  Patient Care Team: Center, [facility] as PCP - General (General Practice)  CHIEF COMPLAINTS/PURPOSE OF CONSULTATION: Multiple myeloma Multiple myeloma.  #  Oncology History   # SEP 2018- MULTIPLE MYELOMA IgALamda [2.5 gm/dl; K/L= 88/1298]; STAGE III [beta 2 microglobulin=5.5] [presented with acute renal failure; anemia; NO hypercalcemia; Skeletal survey-Normal]; BMBx- 45% plasma cells; FISH-POSITIVE 11:14 translocation.[STANDARD-high RISK]/cyto-Normal; SEP 2018- PET- L3 posterior element lesion.   # 9/14- velcade SQ twice weekly/Dex 40 mg/week; OCT 5th 2018-Start R [82m]VD; 3cycles of RVD- PARTAL RESPONSE  # Jan 11th 2019-Dara-Rev-Dex   # Acute renal failure [Dr.Singh; Proteinuria 1.5gm/day ]; acyclovir/Asprin     Multiple myeloma in relapse (HCC)     HISTORY OF PRESENTING ILLNESS:  Brandi [id number].[id number].  female recently diagnosed multiple myeloma-on Revlimid is here for follow-up.  Patient is currently off her treatment for the last 10 days or so-because of diagnosis of pneumonia on the CT scan.  Patient is status post antibiotics including Levaquin-symptoms have resolved.  However she continues to have intermittent short of breath with exertion mild cough.  She also complains of increasing pain in her right hip mostly at nighttime.  Not so much with exertion.  Patient is taking Tylenol for pain.   No nausea no vomiting. No difficult swallowing.  Otherwise no chest pain or shortness of breath.  No significant skin rash.  ROS: A complete 10 point review of system is done which is negative except mentioned above in history of present illness  MEDICAL HISTORY:  Past Medical History:  Diagnosis Date  . Hypertension   . Multiple myeloma (HCC)     SURGICAL HISTORY: No past surgical history on file.  SOCIAL HISTORY:  [place] daughter lives close by. She smokes about 4-5 cigarettes a day.  No alcohol. She used to work in a [employer] Currently retired.  Social History   Socioeconomic History  . Marital status: Single    Spouse name: Not on file  . Number of children: Not on file  . Years of education: Not on file  . Highest education level: Not on file  Social Needs  . Financial resource strain: Not on file  . Food insecurity - worry: Not on file  . Food insecurity - inability: Not on file  . Transportation needs - medical: Not on file  . Transportation needs - non-medical: Not on file  Occupational History  . Not on file  Tobacco Use  . Smoking status: Current Every Day Smoker    Packs/day: 0.50    Types: Cigarettes  . Smokeless tobacco: Never Used  Substance and Sexual Activity  . Alcohol use: No  . Drug use: No  . Sexual activity: Not on file  Other Topics Concern  . Not on file  Social History Narrative   Lives at home with family. Independent at baseline    FAMILY HISTORY: Family History  Problem Relation Age of Onset  . Pneumonia Mother   . Seizures Father     ALLERGIES:  has No Known Allergies.  MEDICATIONS:  Current Outpatient Medications  Medication Sig Dispense Refill  . acetaminophen (TYLENOL) 500 MG tablet Take 1 tablet (500 mg total) by mouth every 6 (six) hours as needed (TAKE 1 TABLET DAILY X 4 DAYS-STARTING 2 DAYS PRIOR TO CHEMOTHERAPY.). (Patient taking differently: Take 1,000 mg by mouth every 6 (six) hours as needed (TAKE 1 TABLET DAILY X 4 DAYS-STARTING 2 DAYS  PRIOR TO CHEMOTHERAPY.). ) 30 tablet 0  . acyclovir (ZOVIRAX) 400 MG tablet One pill a day [to prevent shingles] 30 tablet 3  . amLODipine (NORVASC) 10 MG tablet Take 1 tablet (10 mg total) by mouth daily. 30 tablet 0  . cyanocobalamin 100 MCG tablet Take 1 tablet (100 mcg total) by mouth daily. 30 tablet 0  . dexamethasone (DECADRON) 4 MG tablet Take 10 tablets (40 mg total) by mouth once a week. 10 pills in AM with food. (Patient taking differently: Take 4 mg by mouth 2 (two) times  daily. Take with food - (TAKE 1 TABLET IN THE AM AND 1 TABLET IN THE PM-)  start on 2 days prior to chemotherapy infusion and take for a total of 4 days).) 40 tablet 4  . feeding supplement, ENSURE ENLIVE, (ENSURE ENLIVE) LIQD Take 237 mLs by mouth 2 (two) times daily between meals. 60 Bottle 0  . loratadine (CLARITIN) 10 MG tablet Take 1 tablet (10 mg total) by mouth daily. Take daily x 4 days (start 2 days prior to chemo) 30 tablet 3  . montelukast (SINGULAIR) 10 MG tablet Take 1 tablet (10 mg total) by mouth at bedtime. Start 2 days prior to chemo. Take for 4 days total 30 tablet 3  . pantoprazole (PROTONIX) 40 MG tablet Take 1 tablet (40 mg total) by mouth daily.    . potassium chloride SA (K-DUR,KLOR-CON) 20 MEQ tablet Take 1 tablet (20 mEq total) by mouth 2 (two) times daily. 30 tablet 0  . ranitidine (ZANTAC) 150 MG tablet Take 1 tablet (150 mg total) by mouth 2 (two) times daily. START TAKING 2 DAYS PRIOR TO CHEMOTHERAPY. TAKE FOR A TOTAL OF 4 DAYS    . temazepam (RESTORIL) 7.5 MG capsule Take 1 capsule (7.5 mg total) by mouth at bedtime. 30 capsule 0  . dronabinol (MARINOL) 5 MG capsule Take 1 capsule (5 mg total) by mouth 2 (two) times daily before lunch and supper. (Patient not taking: Reported on 08/04/2017) 60 capsule 3  . HYDROcodone-acetaminophen (NORCO) 5-325 MG tablet Take 1 tablet by mouth every 8 (eight) hours as needed for moderate pain. 60 tablet 0  . lenalidomide (REVLIMID) 20 MG capsule Take 1 capsule (20 mg total) by mouth daily for 14 days. 14 capsule 0  . polyethylene glycol (MIRALAX / GLYCOLAX) packet Take 17 g by mouth daily as needed for mild constipation. (Patient not taking: Reported on 08/04/2017) 14 each 0  . REVLIMID 20 MG capsule Take 1 tablet by mouth daily. x14 days     No current facility-administered medications for this visit.       [name]  PHYSICAL EXAMINATION: ECOG PERFORMANCE STATUS: 1 - Symptomatic but completely ambulatory  Vitals:   08/04/17 1125  BP:  137/87  Pulse: 89  Resp: 20  Temp: 97.9 F (36.6 C)   Filed Weights   08/04/17 1125  Weight: 139 lb 8 oz (63.3 kg)    GENERAL: Well-nourished well-developed; Alert, no distress and comfortable. She is accompanied by family. EYES: no pallor or icterus OROPHARYNX: no thrush or ulceration; good dentition  NECK: supple, no masses felt LYMPH:  no palpable lymphadenopathy in the cervical, axillary or inguinal regions LUNGS: clear to auscultation and  No wheeze or crackles HEART/CVS: regular rate & rhythm and no murmurs; No lower extremity edema ABDOMEN: abdomen soft, non-tender and normal bowel sounds Musculoskeletal:no cyanosis of digits and no clubbing  PSYCH: alert & oriented x 3 with fluent speech NEURO: no focal motor/sensory  deficits SKIN: Faint rash on her torso/   LABORATORY DATA:  I have reviewed the data as listed Lab Results  Component Value Date   WBC 8.5 08/12/2017   HGB 10.3 (L) 08/12/2017   HCT 30.6 (L) 08/12/2017   MCV 96.1 08/12/2017   PLT 215 08/12/2017   Recent Labs    07/29/17 1440 08/04/17 1115 08/12/17 0805  NA 139 136 138  K 3.7 4.1 3.5  CL 103 104 101  CO2 _0 GLUCOSE 100* 108* 102*  BUN _1 CREATININE 1.00 0.86 0.88  CALCIUM 9.0 9.4 9.7  GFRNONAA 58* >60 >60  GFRAA >60 >60 >60  PROT 8.8* 9.0* 8.5*  ALBUMIN 3.6 3.7 3.7  AST _2 ALT 9* 12* 31  ALKPHOS 47 45 48  BILITOT <0.1* 0.3 0.3    RADIOGRAPHIC STUDIES: I have personally reviewed the radiological images as listed and agreed with the findings in the report. Dg Chest 2 View  Result Date: 07/29/2017 CLINICAL DATA:  Multiple myeloma.  Productive cough. EXAM: CHEST  2 VIEW COMPARISON:  07/14/2017 chest radiograph. FINDINGS: Stable cardiomediastinal silhouette with normal heart size and mildly tortuous atherosclerotic thoracic aorta. No pneumothorax. No pleural effusion. Minimal new patchy opacity in the lingula. Otherwise no acute pulmonary disease. IMPRESSION: Minimal new  patchy opacity in the lingula, cannot exclude developing lingular pneumonia. Recommend follow-up PA and lateral post treatment chest radiographs in 4-6 weeks. Electronically Signed   By: Ilona Sorrel M.D.   On: 07/29/2017 17:16   Mr Pelvis W Wo Contrast  Result Date: 08/02/2017 CLINICAL DATA:  Worsening right hip pain for the past 2 weeks. Diagnosed with multiple myeloma in September 2018. EXAM: MRI PELVIS WITHOUT AND WITH CONTRAST TECHNIQUE: Multiplanar multisequence MR imaging of the pelvis was performed both before and after administration of intravenous contrast. CONTRAST:  53m MULTIHANCE GADOBENATE DIMEGLUMINE 529 MG/ML IV SOLN COMPARISON:  Head CT dated April 21, 2017. CT abdomen and pelvis dated April 05, 2017. FINDINGS: Bones: There is a focal T2 hyperintense, T1 hypointense, enhancing lesion in the right acetabulum. This lesion measures approximately 3.4 x 1.6 cm (AP by TR). There is also a 1.6 cm lesion with similar signal characteristics in the right inferior facet of L2, as seen on prior PET-CT. Relatively symmetric, heterogeneously enhancing marrow signal throughout the remainder of the pelvis is likely related to chemotherapy. There is no evidence of acute fracture, dislocation or avascular necrosis.Mild degenerative changes of the pubic symphysis. The sacroiliac joints are unremarkable. Articular cartilage and labrum Articular cartilage: Scattered partial-thickness cartilage loss of both hip joints. Labrum: There is no gross labral tear or paralabral abnormality. Joint or bursal effusion Joint effusion: Small right hip joint effusion, likely reactive. Bursae: No focal periarticular fluid collection. Muscles and tendons Muscles and tendons: The visualized gluteus, hamstring and iliopsoas tendons appear normal. The piriformis muscles appear symmetric. Other findings Miscellaneous: Small uterine fibroids. The visualized internal pelvic contents otherwise appear unremarkable. IMPRESSION: 1.  Focal, enhancing lesion in the right acetabulum, concerning for myeloma. Small right hip joint effusion is likely reactive. 2. Relatively unchanged enhancing lesion within the right inferior facet of L2, as seen on prior PET-CT, also consistent with myelomatous involvement. 3. Relatively symmetric, heterogeneously enhancing marrow signal throughout the remainder of the pelvis is felt to be related to chemotherapy. These results will be called to the ordering clinician or representative by the Radiologist Assistant, and communication documented in the PACS or zVision Dashboard. Electronically Signed  By: Titus Dubin M.D.   On: 08/02/2017 08:56   IMPRESSION: 1. There is an FDG avid lytic lesion in the right posterior element of L3 consistent with myelomatous involvement. More mild uptake without CT correlate in L5 is nonspecific. Recommend attention on follow-up. No other bony involvement identified. 2. Atherosclerotic changes seen in the aorta. Coronary artery calcifications. 3. Nodular uptake in the skin and subcutaneous tissues of the right pelvis, likely an injection site. Recommend clinical correlation to exclude a skin lesion. Aortic Atherosclerosis (ICD10-I70.0).   Electronically Signed   By: Dorise Bullion III M.D   On: 04/21/2017 15:09  -------------------------- 05/12/2017  Results for Brandi, [name] (MRN [id number]) as of 07/07/2017 21:40  Ref. Range 04/12/2017 13:25 04/15/2017 10:14 04/21/2017 10:21 04/21/2017 12:17 04/22/2017 13:34 04/22/2017 14:15 04/29/2017 10:10 04/29/2017 10:16 05/06/2017 08:40 05/12/2017 10:48 05/19/2017 13:03 05/26/2017 08:30 06/02/2017 09:38 06/09/2017 09:00 06/15/2017 08:18 06/23/2017 09:28 07/07/2017 10:22  Kappa free light chain Latest Ref Range: 3.3 - 19.4 mg/L        56.9 (H)    38.6 (H)    33.7 (H)   Lamda free light chains Latest Ref Range: 5.7 - 26.3 mg/L        828.9 (H)    944.2 (H)    739.8 (H)   Kappa, lamda light chain ratio Latest Ref Range:  0.26 - 1.65         0.07 (L)    0.04 (L)    0.05 (L)   M Protein SerPl Elph-Mcnc Latest Ref Range: Not Observed g/dL       3.2 (H)     2.9 (H)    2.2 (H)       ASSESSMENT & PLAN:   Multiple myeloma in relapse (HCC) # Multiple myeloma-stage III. New diagnosis IgA lambda [sep 7858]; cytogenetics/fish- 11:14-STANDARD/? High RISK]; pretreatment-M protein 3.7; lambda light chain 1298. Currently on cycle #4 of RVD.  # ONLY Partial response noted so far-November 2018- M protein 2.2 g/lambda light chain 739; however December 2018-M protein 2.4/increasing; mild improvement of the lambda light chain.  #Recommend proceeding with daratumumab/rev/dex-in approximately 1 week.  Would like to get a better response prior to proceeding with transplant.   Discussed with Dr. Perfecto Kingdom at [facility].   # Discussed the rational for rationale for change of treatment; discussed the potential infusion reactions.  Would recommend premedication with steroids; Zantac Singulair Tylenol Benadryl.   # right hip pain- radiating to right LE- worsening;  MRI pelvis. ? Need for RT. Not on pain meds.  Will discuss with radiation oncology.  # Pneumonia-left upper lobe noted on CT scan; symptomatic chest wall pain; start Levaquin 750 mg once a day for 7 days- resolved. Get CXR today.  # Skeletal lesion- on  X [place]- proceed today ca- 8.6 recommend ca+vit D  # Infectious prophylaxis- continue acyclovir/ DVT prohylaxis- on asprin 325 mg/day.   #Follow-up in 1 week for dara infusion follow-up with me in 2 weeks infusion labs.     All questions were answered. The patient knows to call the clinic with any problems, questions or concerns.    [name], MD 08/16/2017 2:12 PM

## 2017-08-05 ENCOUNTER — Inpatient Hospital Stay: Payer: Medicare Other

## 2017-08-05 VITALS — BP 127/81 | HR 76 | Temp 98.9°F | Resp 18

## 2017-08-05 DIAGNOSIS — C9002 Multiple myeloma in relapse: Secondary | ICD-10-CM

## 2017-08-05 DIAGNOSIS — M899 Disorder of bone, unspecified: Secondary | ICD-10-CM | POA: Diagnosis not present

## 2017-08-05 DIAGNOSIS — F17210 Nicotine dependence, cigarettes, uncomplicated: Secondary | ICD-10-CM | POA: Diagnosis not present

## 2017-08-05 DIAGNOSIS — J189 Pneumonia, unspecified organism: Secondary | ICD-10-CM | POA: Diagnosis not present

## 2017-08-05 DIAGNOSIS — Z5112 Encounter for antineoplastic immunotherapy: Secondary | ICD-10-CM | POA: Diagnosis not present

## 2017-08-05 DIAGNOSIS — M25551 Pain in right hip: Secondary | ICD-10-CM | POA: Diagnosis not present

## 2017-08-05 LAB — TYPE AND SCREEN
ABO/RH(D): O POS
ANTIBODY SCREEN: NEGATIVE

## 2017-08-05 MED ORDER — METHYLPREDNISOLONE SODIUM SUCC 125 MG IJ SOLR
125.0000 mg | Freq: Once | INTRAMUSCULAR | Status: AC
  Administered 2017-08-05: 125 mg via INTRAVENOUS
  Filled 2017-08-05: qty 2

## 2017-08-05 MED ORDER — SODIUM CHLORIDE 0.9 % IV SOLN
15.8000 mg/kg | Freq: Once | INTRAVENOUS | Status: AC
  Administered 2017-08-05: 1000 mg via INTRAVENOUS
  Filled 2017-08-05: qty 40

## 2017-08-05 MED ORDER — DIPHENHYDRAMINE HCL 25 MG PO CAPS
50.0000 mg | ORAL_CAPSULE | Freq: Once | ORAL | Status: AC
  Administered 2017-08-05: 50 mg via ORAL
  Filled 2017-08-05: qty 2

## 2017-08-05 MED ORDER — DENOSUMAB 120 MG/1.7ML ~~LOC~~ SOLN
120.0000 mg | Freq: Once | SUBCUTANEOUS | Status: AC
  Administered 2017-08-05: 120 mg via SUBCUTANEOUS
  Filled 2017-08-05: qty 1.7

## 2017-08-05 MED ORDER — PROCHLORPERAZINE MALEATE 10 MG PO TABS
10.0000 mg | ORAL_TABLET | Freq: Once | ORAL | Status: AC
  Administered 2017-08-05: 10 mg via ORAL
  Filled 2017-08-05: qty 1

## 2017-08-05 MED ORDER — SODIUM CHLORIDE 0.9 % IV SOLN
Freq: Once | INTRAVENOUS | Status: AC
  Administered 2017-08-05: 09:00:00 via INTRAVENOUS
  Filled 2017-08-05: qty 1000

## 2017-08-05 MED ORDER — ACETAMINOPHEN 325 MG PO TABS
650.0000 mg | ORAL_TABLET | Freq: Once | ORAL | Status: AC
  Administered 2017-08-05: 650 mg via ORAL
  Filled 2017-08-05: qty 2

## 2017-08-06 LAB — PRETREATMENT RBC PHENOTYPE

## 2017-08-11 ENCOUNTER — Other Ambulatory Visit: Payer: Self-pay | Admitting: Internal Medicine

## 2017-08-11 DIAGNOSIS — C9002 Multiple myeloma in relapse: Secondary | ICD-10-CM

## 2017-08-12 ENCOUNTER — Inpatient Hospital Stay: Payer: Medicare Other

## 2017-08-12 ENCOUNTER — Ambulatory Visit: Payer: Medicare Other

## 2017-08-12 ENCOUNTER — Other Ambulatory Visit: Payer: Medicare Other

## 2017-08-12 ENCOUNTER — Other Ambulatory Visit: Payer: Self-pay | Admitting: Internal Medicine

## 2017-08-12 VITALS — BP 123/73 | HR 64 | Temp 98.9°F | Resp 18 | Wt 142.6 lb

## 2017-08-12 DIAGNOSIS — C9002 Multiple myeloma in relapse: Secondary | ICD-10-CM

## 2017-08-12 DIAGNOSIS — Z5112 Encounter for antineoplastic immunotherapy: Secondary | ICD-10-CM | POA: Diagnosis not present

## 2017-08-12 DIAGNOSIS — F17210 Nicotine dependence, cigarettes, uncomplicated: Secondary | ICD-10-CM | POA: Diagnosis not present

## 2017-08-12 DIAGNOSIS — M899 Disorder of bone, unspecified: Secondary | ICD-10-CM | POA: Diagnosis not present

## 2017-08-12 DIAGNOSIS — M25551 Pain in right hip: Secondary | ICD-10-CM | POA: Diagnosis not present

## 2017-08-12 DIAGNOSIS — J189 Pneumonia, unspecified organism: Secondary | ICD-10-CM | POA: Diagnosis not present

## 2017-08-12 LAB — COMPREHENSIVE METABOLIC PANEL
ALT: 31 U/L (ref 14–54)
AST: 27 U/L (ref 15–41)
Albumin: 3.7 g/dL (ref 3.5–5.0)
Alkaline Phosphatase: 48 U/L (ref 38–126)
Anion gap: 12 (ref 5–15)
BUN: 17 mg/dL (ref 6–20)
CHLORIDE: 101 mmol/L (ref 101–111)
CO2: 25 mmol/L (ref 22–32)
CREATININE: 0.88 mg/dL (ref 0.44–1.00)
Calcium: 9.7 mg/dL (ref 8.9–10.3)
Glucose, Bld: 102 mg/dL — ABNORMAL HIGH (ref 65–99)
POTASSIUM: 3.5 mmol/L (ref 3.5–5.1)
Sodium: 138 mmol/L (ref 135–145)
Total Bilirubin: 0.3 mg/dL (ref 0.3–1.2)
Total Protein: 8.5 g/dL — ABNORMAL HIGH (ref 6.5–8.1)

## 2017-08-12 LAB — CBC WITH DIFFERENTIAL/PLATELET
Basophils Absolute: 0.1 10*3/uL (ref 0–0.1)
Basophils Relative: 1 %
EOS ABS: 0.2 10*3/uL (ref 0–0.7)
Eosinophils Relative: 2 %
HEMATOCRIT: 30.6 % — AB (ref 35.0–47.0)
HEMOGLOBIN: 10.3 g/dL — AB (ref 12.0–16.0)
LYMPHS PCT: 27 %
Lymphs Abs: 2.3 10*3/uL (ref 1.0–3.6)
MCH: 32.2 pg (ref 26.0–34.0)
MCHC: 33.6 g/dL (ref 32.0–36.0)
MCV: 96.1 fL (ref 80.0–100.0)
MONOS PCT: 7 %
Monocytes Absolute: 0.6 10*3/uL (ref 0.2–0.9)
NEUTROS ABS: 5.4 10*3/uL (ref 1.4–6.5)
NEUTROS PCT: 63 %
Platelets: 215 10*3/uL (ref 150–440)
RBC: 3.18 MIL/uL — AB (ref 3.80–5.20)
RDW: 15.8 % — ABNORMAL HIGH (ref 11.5–14.5)
WBC: 8.5 10*3/uL (ref 3.6–11.0)

## 2017-08-12 MED ORDER — PROCHLORPERAZINE MALEATE 10 MG PO TABS
10.0000 mg | ORAL_TABLET | Freq: Once | ORAL | Status: AC
Start: 2017-08-12 — End: 2017-08-12
  Administered 2017-08-12: 10 mg via ORAL
  Filled 2017-08-12: qty 1

## 2017-08-12 MED ORDER — SODIUM CHLORIDE 0.9 % IV SOLN
Freq: Once | INTRAVENOUS | Status: AC
Start: 2017-08-12 — End: 2017-08-12
  Administered 2017-08-12: 09:00:00 via INTRAVENOUS
  Filled 2017-08-12: qty 1000

## 2017-08-12 MED ORDER — SODIUM CHLORIDE 0.9 % IV SOLN
1000.0000 mg | Freq: Once | INTRAVENOUS | Status: AC
  Administered 2017-08-12: 1000 mg via INTRAVENOUS
  Filled 2017-08-12: qty 40

## 2017-08-12 MED ORDER — ACETAMINOPHEN 325 MG PO TABS
650.0000 mg | ORAL_TABLET | Freq: Once | ORAL | Status: AC
  Administered 2017-08-12: 650 mg via ORAL
  Filled 2017-08-12: qty 2

## 2017-08-12 MED ORDER — METHYLPREDNISOLONE SODIUM SUCC 125 MG IJ SOLR
125.0000 mg | Freq: Once | INTRAMUSCULAR | Status: AC
  Administered 2017-08-12: 125 mg via INTRAVENOUS
  Filled 2017-08-12: qty 2

## 2017-08-12 MED ORDER — DIPHENHYDRAMINE HCL 25 MG PO CAPS
50.0000 mg | ORAL_CAPSULE | Freq: Once | ORAL | Status: AC
  Administered 2017-08-12: 50 mg via ORAL
  Filled 2017-08-12: qty 2

## 2017-08-18 ENCOUNTER — Other Ambulatory Visit: Payer: Self-pay

## 2017-08-18 ENCOUNTER — Inpatient Hospital Stay (HOSPITAL_BASED_OUTPATIENT_CLINIC_OR_DEPARTMENT_OTHER): Payer: Medicare Other | Admitting: Internal Medicine

## 2017-08-18 ENCOUNTER — Encounter: Payer: Self-pay | Admitting: Internal Medicine

## 2017-08-18 ENCOUNTER — Inpatient Hospital Stay: Payer: Medicare Other

## 2017-08-18 ENCOUNTER — Other Ambulatory Visit: Payer: Self-pay | Admitting: Internal Medicine

## 2017-08-18 VITALS — BP 121/83 | HR 87 | Temp 99.2°F | Wt 143.2 lb

## 2017-08-18 DIAGNOSIS — M899 Disorder of bone, unspecified: Secondary | ICD-10-CM | POA: Diagnosis not present

## 2017-08-18 DIAGNOSIS — F17210 Nicotine dependence, cigarettes, uncomplicated: Secondary | ICD-10-CM | POA: Diagnosis not present

## 2017-08-18 DIAGNOSIS — M25551 Pain in right hip: Secondary | ICD-10-CM | POA: Diagnosis not present

## 2017-08-18 DIAGNOSIS — Z5112 Encounter for antineoplastic immunotherapy: Secondary | ICD-10-CM | POA: Diagnosis not present

## 2017-08-18 DIAGNOSIS — C9002 Multiple myeloma in relapse: Secondary | ICD-10-CM

## 2017-08-18 DIAGNOSIS — J189 Pneumonia, unspecified organism: Secondary | ICD-10-CM

## 2017-08-18 LAB — CBC WITH DIFFERENTIAL/PLATELET
BASOS PCT: 1 %
Basophils Absolute: 0 10*3/uL (ref 0–0.1)
EOS ABS: 0.1 10*3/uL (ref 0–0.7)
EOS PCT: 5 %
HCT: 30.9 % — ABNORMAL LOW (ref 35.0–47.0)
HEMOGLOBIN: 10.2 g/dL — AB (ref 12.0–16.0)
Lymphocytes Relative: 36 %
Lymphs Abs: 1 10*3/uL (ref 1.0–3.6)
MCH: 32 pg (ref 26.0–34.0)
MCHC: 33.1 g/dL (ref 32.0–36.0)
MCV: 96.7 fL (ref 80.0–100.0)
Monocytes Absolute: 0.1 10*3/uL — ABNORMAL LOW (ref 0.2–0.9)
Monocytes Relative: 4 %
NEUTROS PCT: 54 %
Neutro Abs: 1.5 10*3/uL (ref 1.4–6.5)
PLATELETS: 245 10*3/uL (ref 150–440)
RBC: 3.19 MIL/uL — AB (ref 3.80–5.20)
RDW: 16.2 % — ABNORMAL HIGH (ref 11.5–14.5)
WBC: 2.9 10*3/uL — AB (ref 3.6–11.0)

## 2017-08-18 LAB — COMPREHENSIVE METABOLIC PANEL
ALK PHOS: 55 U/L (ref 38–126)
ALT: 71 U/L — ABNORMAL HIGH (ref 14–54)
AST: 34 U/L (ref 15–41)
Albumin: 3.6 g/dL (ref 3.5–5.0)
Anion gap: 10 (ref 5–15)
BILIRUBIN TOTAL: 0.4 mg/dL (ref 0.3–1.2)
BUN: 13 mg/dL (ref 6–20)
CALCIUM: 8.6 mg/dL — AB (ref 8.9–10.3)
CO2: 24 mmol/L (ref 22–32)
Chloride: 100 mmol/L — ABNORMAL LOW (ref 101–111)
Creatinine, Ser: 0.67 mg/dL (ref 0.44–1.00)
Glucose, Bld: 99 mg/dL (ref 65–99)
Potassium: 3.6 mmol/L (ref 3.5–5.1)
Sodium: 134 mmol/L — ABNORMAL LOW (ref 135–145)
TOTAL PROTEIN: 8 g/dL (ref 6.5–8.1)

## 2017-08-18 NOTE — Telephone Encounter (Signed)
Dx:  Multiple myeloma in relapse (HCC)   Ref Range & Units 10:55  Potassium 3.5 - 5.1 mmol/L 3.6

## 2017-08-18 NOTE — Assessment & Plan Note (Addendum)
#  Multiple myeloma-stage III. Transplant eligible- IgA lambda [sep 1771]; cytogenetics/fish- 11:14-STANDARD/? High RISK]; pretreatment-M protein 3.7; lambda light chain 1298.   #Currently on second line therapy with Rev-dara-dex-given partial response to RVD.  # on dara- Rev; last day tomorrow- continue current dose to 20 mg/day. Cont rev 20 mg 2 weeks on and 1 week off.  Continue weekly 40 Dex.  #Right hip pain-MRI shows lesion-however given the improvement in pain hold of radiation.  # Pneumonia-left upper lobe noted on CT scan; symptomatic chest wall pain;   # Skeletal lesion- on  X [place]- proceed today ca- 8.6 recommend ca+vit D  # Infectious prophylaxis- continue acyclovir/ DVT prohylaxis- on asprin 325 mg/day.   # weekly cbc/bmp; dara; in 2 weeks;cbc/cmp/MM panel; k-l light chians.

## 2017-08-19 ENCOUNTER — Inpatient Hospital Stay: Payer: Medicare Other

## 2017-08-19 VITALS — BP 126/82 | HR 69 | Temp 97.3°F | Resp 18 | Wt 144.4 lb

## 2017-08-19 DIAGNOSIS — M25551 Pain in right hip: Secondary | ICD-10-CM | POA: Diagnosis not present

## 2017-08-19 DIAGNOSIS — J189 Pneumonia, unspecified organism: Secondary | ICD-10-CM | POA: Diagnosis not present

## 2017-08-19 DIAGNOSIS — Z5112 Encounter for antineoplastic immunotherapy: Secondary | ICD-10-CM | POA: Diagnosis not present

## 2017-08-19 DIAGNOSIS — C9002 Multiple myeloma in relapse: Secondary | ICD-10-CM

## 2017-08-19 DIAGNOSIS — F17210 Nicotine dependence, cigarettes, uncomplicated: Secondary | ICD-10-CM | POA: Diagnosis not present

## 2017-08-19 DIAGNOSIS — M899 Disorder of bone, unspecified: Secondary | ICD-10-CM | POA: Diagnosis not present

## 2017-08-19 MED ORDER — SODIUM CHLORIDE 0.9 % IV SOLN
1000.0000 mg | Freq: Once | INTRAVENOUS | Status: AC
  Administered 2017-08-19: 1000 mg via INTRAVENOUS
  Filled 2017-08-19: qty 50

## 2017-08-19 MED ORDER — ACETAMINOPHEN 325 MG PO TABS
650.0000 mg | ORAL_TABLET | Freq: Once | ORAL | Status: AC
  Administered 2017-08-19: 650 mg via ORAL
  Filled 2017-08-19: qty 2

## 2017-08-19 MED ORDER — SODIUM CHLORIDE 0.9 % IV SOLN
1000.0000 mg | Freq: Once | INTRAVENOUS | Status: DC
  Filled 2017-08-19: qty 50

## 2017-08-19 MED ORDER — METHYLPREDNISOLONE SODIUM SUCC 125 MG IJ SOLR
125.0000 mg | Freq: Once | INTRAMUSCULAR | Status: AC
  Administered 2017-08-19: 125 mg via INTRAVENOUS
  Filled 2017-08-19: qty 2

## 2017-08-19 MED ORDER — PROCHLORPERAZINE MALEATE 10 MG PO TABS
10.0000 mg | ORAL_TABLET | Freq: Once | ORAL | Status: AC
  Administered 2017-08-19: 10 mg via ORAL
  Filled 2017-08-19: qty 1

## 2017-08-19 MED ORDER — SODIUM CHLORIDE 0.9 % IV SOLN
Freq: Once | INTRAVENOUS | Status: AC
  Administered 2017-08-19: 09:00:00 via INTRAVENOUS
  Filled 2017-08-19: qty 1000

## 2017-08-19 MED ORDER — DIPHENHYDRAMINE HCL 25 MG PO CAPS
50.0000 mg | ORAL_CAPSULE | Freq: Once | ORAL | Status: AC
  Administered 2017-08-19: 50 mg via ORAL
  Filled 2017-08-19: qty 2

## 2017-08-23 NOTE — Progress Notes (Signed)
[place] NOTE  Patient Care Team: Center, [facility] as PCP - General (General Practice)  CHIEF COMPLAINTS/PURPOSE OF CONSULTATION: Multiple myeloma Multiple myeloma.  #  Oncology History   # SEP 2018- MULTIPLE MYELOMA IgALamda [2.5 gm/dl; K/L= 88/1298]; STAGE III [beta 2 microglobulin=5.5] [presented with acute renal failure; anemia; NO hypercalcemia; Skeletal survey-Normal]; BMBx- 45% plasma cells; FISH-POSITIVE 11:14 translocation.[STANDARD-high RISK]/cyto-Normal; SEP 2018- PET- L3 posterior element lesion.   # 9/14- velcade SQ twice weekly/Dex 40 mg/week; OCT 5th 2018-Start R [45m]VD; 3cycles of RVD- PARTAL RESPONSE  # Jan 11th 2019-Dara-Rev-Dex   # Acute renal failure [Dr.Singh; Proteinuria 1.5gm/day ]; acyclovir/Asprin     Multiple myeloma in relapse (HCC)     HISTORY OF PRESENTING ILLNESS:  Brandi DALLAIRE89y.o.  female multiple myeloma stage III transplant eligible currently on Dara-rev-Dex is here for follow-up.  Patient tolerated dara infusions fairly well without any major infusion reactions.  Patient noted to have improvement of right hip pain.  No nausea no vomiting. No difficult swallowing.  Otherwise no chest pain or shortness of breath.  No significant skin rash.  ROS: A complete 10 point review of system is done which is negative except mentioned above in history of present illness  MEDICAL HISTORY:  Past Medical History:  Diagnosis Date  . Hypertension   . [place]     SURGICAL HISTORY: History reviewed. No pertinent surgical history.  SOCIAL HISTORY:  [place] Her daughter lives close by. She smokes about 4-5 cigarettes a day. No alcohol. She used to work in a [employer] Currently retired.  Social History   Socioeconomic History  . Marital status: Single    Spouse name: Not on file  . Number of children: Not on file  . Years of education: Not on file  . Highest  education level: Not on file  Social Needs  . Financial resource strain: Not on file  . Food insecurity - worry: Not on file  . Food insecurity - inability: Not on file  . Transportation needs - medical: Not on file  . Transportation needs - non-medical: Not on file  Occupational History  . Not on file  Tobacco Use  . Smoking status: Current Every Day Smoker    Packs/day: 0.50    Types: Cigarettes  . Smokeless tobacco: Never Used  Substance and Sexual Activity  . Alcohol use: No  . Drug use: No  . Sexual activity: Not on file  Other Topics Concern  . Not on file  Social History Narrative   Lives at home with family. Independent at baseline    FAMILY HISTORY: Family History  Problem Relation Age of Onset  . Pneumonia Mother   . Seizures Father     ALLERGIES:  has No Known Allergies.  MEDICATIONS:  Current Outpatient Medications  Medication Sig Dispense Refill  . acetaminophen (TYLENOL) 500 MG tablet Take 1 tablet (500 mg total) by mouth every 6 (six) hours as needed (TAKE 1 TABLET DAILY X 4 DAYS-STARTING 2 DAYS PRIOR TO CHEMOTHERAPY.). (Patient taking differently: Take 1,000 mg by mouth every 6 (six) hours as needed (TAKE 1 TABLET DAILY X 4 DAYS-STARTING 2 DAYS PRIOR TO CHEMOTHERAPY.). ) 30 tablet 0  . acyclovir (ZOVIRAX) 400 MG tablet One pill a day [to prevent shingles] 30 tablet 3  . amLODipine (NORVASC) 10 MG tablet Take 1 tablet (10 mg total) by mouth daily. 30 tablet 0  . cyanocobalamin 100 MCG  tablet Take 1 tablet (100 mcg total) by mouth daily. 30 tablet 0  . dexamethasone (DECADRON) 4 MG tablet Take 10 tablets (40 mg total) by mouth once a week. 10 pills in AM with food. (Patient taking differently: Take 4 mg by mouth 2 (two) times daily. Take with food - (TAKE 1 TABLET IN THE AM AND 1 TABLET IN THE PM-)  start on 2 days prior to chemotherapy infusion and take for a total of 4 days).) 40 tablet 4  . feeding supplement, ENSURE ENLIVE, (ENSURE ENLIVE) LIQD Take 237 mLs  by mouth 2 (two) times daily between meals. 60 Bottle 0  . HYDROcodone-acetaminophen (NORCO) 5-325 MG tablet Take 1 tablet by mouth every 8 (eight) hours as needed for moderate pain. 60 tablet 0  . loratadine (CLARITIN) 10 MG tablet Take 1 tablet (10 mg total) by mouth daily. Take daily x 4 days (start 2 days prior to chemo) 30 tablet 3  . montelukast (SINGULAIR) 10 MG tablet Take 1 tablet (10 mg total) by mouth at bedtime. Start 2 days prior to chemo. Take for 4 days total 30 tablet 3  . pantoprazole (PROTONIX) 40 MG tablet Take 1 tablet (40 mg total) by mouth daily.    . potassium chloride SA (K-DUR,KLOR-CON) 20 MEQ tablet Take 1 tablet (20 mEq total) by mouth 2 (two) times daily. 30 tablet 0  . ranitidine (ZANTAC) 150 MG tablet Take 1 tablet (150 mg total) by mouth 2 (two) times daily. START TAKING 2 DAYS PRIOR TO CHEMOTHERAPY. TAKE FOR A TOTAL OF 4 DAYS    . REVLIMID 20 MG capsule Take 1 tablet by mouth daily. x14 days    . temazepam (RESTORIL) 7.5 MG capsule Take 1 capsule (7.5 mg total) by mouth at bedtime. 30 capsule 0  . dronabinol (MARINOL) 5 MG capsule Take 1 capsule (5 mg total) by mouth 2 (two) times daily before lunch and supper. (Patient not taking: Reported on 08/04/2017) 60 capsule 3  . lenalidomide (REVLIMID) 20 MG capsule Take 1 capsule (20 mg total) by mouth daily for 14 days. 14 capsule 0  . polyethylene glycol (MIRALAX / GLYCOLAX) packet Take 17 g by mouth daily as needed for mild constipation. (Patient not taking: Reported on 08/04/2017) 14 each 0   No current facility-administered medications for this visit.       [name]  PHYSICAL EXAMINATION: ECOG PERFORMANCE STATUS: 1 - Symptomatic but completely ambulatory  Vitals:   08/18/17 1107  BP: 121/83  Pulse: 87  Temp: 99.2 F (37.3 C)   Filed Weights   08/18/17 1107  Weight: 143 lb 3.2 oz (65 kg)    GENERAL: Well-nourished well-developed; Alert, no distress and comfortable. She is accompanied by family. EYES: no pallor  or icterus OROPHARYNX: no thrush or ulceration; good dentition  NECK: supple, no masses felt LYMPH:  no palpable lymphadenopathy in the cervical, axillary or inguinal regions LUNGS: clear to auscultation and  No wheeze or crackles HEART/CVS: regular rate & rhythm and no murmurs; No lower extremity edema ABDOMEN: abdomen soft, non-tender and normal bowel sounds Musculoskeletal:no cyanosis of digits and no clubbing  PSYCH: alert & oriented x 3 with fluent speech NEURO: no focal motor/sensory deficits SKIN: Faint rash on her torso/   LABORATORY DATA:  I have reviewed the data as listed Lab Results  Component Value Date   WBC 2.9 (L) 08/18/2017   HGB 10.2 (L) 08/18/2017   HCT 30.9 (L) 08/18/2017   MCV 96.7 08/18/2017   PLT  245 08/18/2017   Recent Labs    08/04/17 1115 08/12/17 0805 08/18/17 1055  NA 136 138 134*  K 4.1 3.5 3.6  CL 104 101 100*  CO2 '22 25 24  ' GLUCOSE 108* 102* 99  BUN '18 17 13  ' CREATININE 0.86 0.88 0.67  CALCIUM 9.4 9.7 8.6*  GFRNONAA >60 >60 >60  GFRAA >60 >60 >60  PROT 9.0* 8.5* 8.0  ALBUMIN 3.7 3.7 3.6  AST 21 27 34  ALT 12* 31 71*  ALKPHOS 45 48 55  BILITOT 0.3 0.3 0.4    RADIOGRAPHIC STUDIES: I have personally reviewed the radiological images as listed and agreed with the findings in the report. Dg Chest 2 View  Result Date: 07/29/2017 CLINICAL DATA:  Multiple myeloma.  Productive cough. EXAM: CHEST  2 VIEW COMPARISON:  07/14/2017 chest radiograph. FINDINGS: Stable cardiomediastinal silhouette with normal heart size and mildly tortuous atherosclerotic thoracic aorta. No pneumothorax. No pleural effusion. Minimal new patchy opacity in the lingula. Otherwise no acute pulmonary disease. IMPRESSION: Minimal new patchy opacity in the lingula, cannot exclude developing lingular pneumonia. Recommend follow-up PA and lateral post treatment chest radiographs in 4-6 weeks. Electronically Signed   By: Ilona Sorrel M.D.   On: 07/29/2017 17:16   Mr Pelvis W Wo  Contrast  Result Date: 08/02/2017 CLINICAL DATA:  Worsening right hip pain for the past 2 weeks. Diagnosed with multiple myeloma in September 2018. EXAM: MRI PELVIS WITHOUT AND WITH CONTRAST TECHNIQUE: Multiplanar multisequence MR imaging of the pelvis was performed both before and after administration of intravenous contrast. CONTRAST:  52m MULTIHANCE GADOBENATE DIMEGLUMINE 529 MG/ML IV SOLN COMPARISON:  Head CT dated April 21, 2017. CT abdomen and pelvis dated April 05, 2017. FINDINGS: Bones: There is a focal T2 hyperintense, T1 hypointense, enhancing lesion in the right acetabulum. This lesion measures approximately 3.4 x 1.6 cm (AP by TR). There is also a 1.6 cm lesion with similar signal characteristics in the right inferior facet of L2, as seen on prior PET-CT. Relatively symmetric, heterogeneously enhancing marrow signal throughout the remainder of the pelvis is likely related to chemotherapy. There is no evidence of acute fracture, dislocation or avascular necrosis.Mild degenerative changes of the pubic symphysis. The sacroiliac joints are unremarkable. Articular cartilage and labrum Articular cartilage: Scattered partial-thickness cartilage loss of both hip joints. Labrum: There is no gross labral tear or paralabral abnormality. Joint or bursal effusion Joint effusion: Small right hip joint effusion, likely reactive. Bursae: No focal periarticular fluid collection. Muscles and tendons Muscles and tendons: The visualized gluteus, hamstring and iliopsoas tendons appear normal. The piriformis muscles appear symmetric. Other findings Miscellaneous: Small uterine fibroids. The visualized internal pelvic contents otherwise appear unremarkable. IMPRESSION: 1. Focal, enhancing lesion in the right acetabulum, concerning for myeloma. Small right hip joint effusion is likely reactive. 2. Relatively unchanged enhancing lesion within the right inferior facet of L2, as seen on prior PET-CT, also consistent with  myelomatous involvement. 3. Relatively symmetric, heterogeneously enhancing marrow signal throughout the remainder of the pelvis is felt to be related to chemotherapy. These results will be called to the ordering clinician or representative by the Radiologist Assistant, and communication documented in the PACS or zVision Dashboard. Electronically Signed   By: WTitus DubinM.D.   On: 08/02/2017 08:56   IMPRESSION: 1. There is an FDG avid lytic lesion in the right posterior element of L3 consistent with myelomatous involvement. More mild uptake without CT correlate in L5 is nonspecific. Recommend attention on follow-up. No other  bony involvement identified. 2. Atherosclerotic changes seen in the aorta. Coronary artery calcifications. 3. Nodular uptake in the skin and subcutaneous tissues of the right pelvis, likely an injection site. Recommend clinical correlation to exclude a skin lesion. Aortic Atherosclerosis (ICD10-I70.0).   Electronically Signed   By: Dorise Bullion III M.D   On: 04/21/2017 15:09  -------------------------- 05/12/2017  Results for ESABELLA, STOCKINGER (MRN [id number]) as of 07/07/2017 21:40  Ref. Range 04/12/2017 13:25 04/15/2017 10:14 04/21/2017 10:21 04/21/2017 12:17 04/22/2017 13:34 04/22/2017 14:15 04/29/2017 10:10 04/29/2017 10:16 05/06/2017 08:40 05/12/2017 10:48 05/19/2017 13:03 05/26/2017 08:30 06/02/2017 09:38 06/09/2017 09:00 06/15/2017 08:18 06/23/2017 09:28 07/07/2017 10:22  Kappa free light chain Latest Ref Range: 3.3 - 19.4 mg/L        56.9 (H)    38.6 (H)    33.7 (H)   Lamda free light chains Latest Ref Range: 5.7 - 26.3 mg/L        828.9 (H)    944.2 (H)    739.8 (H)   Kappa, lamda light chain ratio Latest Ref Range: 0.26 - 1.65         0.07 (L)    0.04 (L)    0.05 (L)   M Protein SerPl Elph-Mcnc Latest Ref Range: Not Observed g/dL       3.2 (H)     2.9 (H)    2.2 (H)       ASSESSMENT & PLAN:   Multiple myeloma in relapse (HCC) # Multiple myeloma-stage  III. Transplant eligible- IgA lambda [sep 8588]; cytogenetics/fish- 11:14-STANDARD/? High RISK]; pretreatment-M protein 3.7; lambda light chain 1298.   #Currently on second line therapy with Rev-dara-dex-given partial response to RVD.  # on dara- Rev; last day tomorrow- continue current dose to 20 mg/day. Cont rev 20 mg 2 weeks on and 1 week off.  Continue weekly 40 Dex.  #Right hip pain-MRI shows lesion-however given the improvement in pain hold of radiation.  # Pneumonia-left upper lobe noted on CT scan; symptomatic chest wall pain;   # Skeletal lesion- on  X [place]- proceed today ca- 8.6 recommend ca+vit D  # Infectious prophylaxis- continue acyclovir/ DVT prohylaxis- on asprin 325 mg/day.   # weekly cbc/bmp; dara; in 2 weeks;cbc/cmp/MM panel; k-l light chians.  All questions were answered. The patient knows to call the clinic with any problems, questions or concerns.    [name], MD 08/23/2017 6:00 PM

## 2017-08-26 ENCOUNTER — Inpatient Hospital Stay: Payer: Medicare Other

## 2017-08-26 ENCOUNTER — Inpatient Hospital Stay: Payer: Medicare Other | Attending: Internal Medicine

## 2017-08-26 VITALS — BP 123/73 | HR 68 | Temp 96.8°F | Resp 20 | Wt 146.0 lb

## 2017-08-26 DIAGNOSIS — C9002 Multiple myeloma in relapse: Secondary | ICD-10-CM | POA: Diagnosis not present

## 2017-08-26 DIAGNOSIS — Z5112 Encounter for antineoplastic immunotherapy: Secondary | ICD-10-CM | POA: Insufficient documentation

## 2017-08-26 LAB — CBC WITH DIFFERENTIAL/PLATELET
BASOS ABS: 0 10*3/uL (ref 0–0.1)
Basophils Relative: 0 %
Eosinophils Absolute: 0.1 10*3/uL (ref 0–0.7)
Eosinophils Relative: 1 %
HEMATOCRIT: 29.1 % — AB (ref 35.0–47.0)
Hemoglobin: 9.8 g/dL — ABNORMAL LOW (ref 12.0–16.0)
LYMPHS ABS: 1.4 10*3/uL (ref 1.0–3.6)
LYMPHS PCT: 29 %
MCH: 32.4 pg (ref 26.0–34.0)
MCHC: 33.8 g/dL (ref 32.0–36.0)
MCV: 95.7 fL (ref 80.0–100.0)
Monocytes Absolute: 0.3 10*3/uL (ref 0.2–0.9)
Monocytes Relative: 7 %
NEUTROS ABS: 3 10*3/uL (ref 1.4–6.5)
Neutrophils Relative %: 63 %
Platelets: 269 10*3/uL (ref 150–440)
RBC: 3.04 MIL/uL — AB (ref 3.80–5.20)
RDW: 16.5 % — ABNORMAL HIGH (ref 11.5–14.5)
WBC: 4.8 10*3/uL (ref 3.6–11.0)

## 2017-08-26 LAB — COMPREHENSIVE METABOLIC PANEL
ALBUMIN: 3.2 g/dL — AB (ref 3.5–5.0)
ALT: 69 U/L — ABNORMAL HIGH (ref 14–54)
AST: 41 U/L (ref 15–41)
Alkaline Phosphatase: 75 U/L (ref 38–126)
Anion gap: 9 (ref 5–15)
BILIRUBIN TOTAL: 0.4 mg/dL (ref 0.3–1.2)
BUN: 9 mg/dL (ref 6–20)
CHLORIDE: 104 mmol/L (ref 101–111)
CO2: 25 mmol/L (ref 22–32)
Calcium: 8.3 mg/dL — ABNORMAL LOW (ref 8.9–10.3)
Creatinine, Ser: 0.57 mg/dL (ref 0.44–1.00)
GFR calc Af Amer: >60 mL/min (ref >60)
GFR calc non Af Amer: >60 mL/min (ref >60)
GLUCOSE: 100 mg/dL — AB (ref 65–99)
POTASSIUM: 3.5 mmol/L (ref 3.5–5.1)
Sodium: 138 mmol/L (ref 135–145)
TOTAL PROTEIN: 8.4 g/dL — AB (ref 6.5–8.1)

## 2017-08-26 MED ORDER — METHYLPREDNISOLONE SODIUM SUCC 125 MG IJ SOLR
125.0000 mg | Freq: Once | INTRAMUSCULAR | Status: AC
  Administered 2017-08-26: 125 mg via INTRAVENOUS
  Filled 2017-08-26: qty 2

## 2017-08-26 MED ORDER — DIPHENHYDRAMINE HCL 25 MG PO CAPS
50.0000 mg | ORAL_CAPSULE | Freq: Once | ORAL | Status: AC
  Administered 2017-08-26: 50 mg via ORAL
  Filled 2017-08-26: qty 2

## 2017-08-26 MED ORDER — SODIUM CHLORIDE 0.9 % IV SOLN
Freq: Once | INTRAVENOUS | Status: AC
  Administered 2017-08-26: 10:00:00 via INTRAVENOUS
  Filled 2017-08-26: qty 1000

## 2017-08-26 MED ORDER — SODIUM CHLORIDE 0.9 % IV SOLN
1000.0000 mg | Freq: Once | INTRAVENOUS | Status: AC
  Administered 2017-08-26: 1000 mg via INTRAVENOUS
  Filled 2017-08-26: qty 10

## 2017-08-26 MED ORDER — ACETAMINOPHEN 325 MG PO TABS
650.0000 mg | ORAL_TABLET | Freq: Once | ORAL | Status: AC
  Administered 2017-08-26: 650 mg via ORAL
  Filled 2017-08-26: qty 2

## 2017-08-26 MED ORDER — SODIUM CHLORIDE 0.9 % IV SOLN: 16.0000 mg/kg | Freq: Once | INTRAVENOUS | Status: DC

## 2017-08-26 MED ORDER — PROCHLORPERAZINE MALEATE 10 MG PO TABS
10.0000 mg | ORAL_TABLET | Freq: Once | ORAL | Status: AC
  Administered 2017-08-26: 10 mg via ORAL
  Filled 2017-08-26: qty 1

## 2017-08-28 ENCOUNTER — Other Ambulatory Visit: Payer: Self-pay | Admitting: Internal Medicine

## 2017-08-28 DIAGNOSIS — C9002 Multiple myeloma in relapse: Secondary | ICD-10-CM

## 2017-09-01 ENCOUNTER — Inpatient Hospital Stay: Payer: Medicare Other

## 2017-09-01 ENCOUNTER — Inpatient Hospital Stay (HOSPITAL_BASED_OUTPATIENT_CLINIC_OR_DEPARTMENT_OTHER): Payer: Medicare Other | Admitting: Internal Medicine

## 2017-09-01 ENCOUNTER — Ambulatory Visit
Admission: RE | Admit: 2017-09-01 | Discharge: 2017-09-01 | Disposition: A | Discharge status: Home / Self Care | Payer: Medicare Other | Source: Ambulatory Visit | Attending: Internal Medicine | Admitting: Internal Medicine

## 2017-09-01 ENCOUNTER — Other Ambulatory Visit: Payer: Self-pay

## 2017-09-01 ENCOUNTER — Encounter: Payer: Self-pay | Admitting: *Deleted

## 2017-09-01 VITALS — BP 151/89 | HR 89 | Temp 98.9°F | Resp 22 | Ht 62.0 in | Wt 145.0 lb

## 2017-09-01 DIAGNOSIS — C9002 Multiple myeloma in relapse: Secondary | ICD-10-CM

## 2017-09-01 DIAGNOSIS — M25551 Pain in right hip: Secondary | ICD-10-CM

## 2017-09-01 DIAGNOSIS — M899 Disorder of bone, unspecified: Secondary | ICD-10-CM

## 2017-09-01 DIAGNOSIS — R509 Fever, unspecified: Secondary | ICD-10-CM

## 2017-09-01 DIAGNOSIS — R531 Weakness: Secondary | ICD-10-CM

## 2017-09-01 DIAGNOSIS — R059 Cough, unspecified: Secondary | ICD-10-CM

## 2017-09-01 DIAGNOSIS — R0981 Nasal congestion: Secondary | ICD-10-CM | POA: Diagnosis not present

## 2017-09-01 DIAGNOSIS — Z5112 Encounter for antineoplastic immunotherapy: Secondary | ICD-10-CM | POA: Diagnosis not present

## 2017-09-01 DIAGNOSIS — R0602 Shortness of breath: Secondary | ICD-10-CM

## 2017-09-01 DIAGNOSIS — R918 Other nonspecific abnormal finding of lung field: Secondary | ICD-10-CM | POA: Diagnosis not present

## 2017-09-01 DIAGNOSIS — R05 Cough: Secondary | ICD-10-CM | POA: Diagnosis not present

## 2017-09-01 LAB — COMPREHENSIVE METABOLIC PANEL
ALBUMIN: 3.5 g/dL (ref 3.5–5.0)
ALK PHOS: 91 U/L (ref 38–126)
ALT: 31 U/L (ref 14–54)
AST: 22 U/L (ref 15–41)
Anion gap: 12 (ref 5–15)
BILIRUBIN TOTAL: 0.4 mg/dL (ref 0.3–1.2)
BUN: 12 mg/dL (ref 6–20)
CALCIUM: 9.4 mg/dL (ref 8.9–10.3)
CO2: 22 mmol/L (ref 22–32)
Chloride: 102 mmol/L (ref 101–111)
Creatinine, Ser: 0.85 mg/dL (ref 0.44–1.00)
GFR calc Af Amer: >60 mL/min (ref >60)
GLUCOSE: 107 mg/dL — AB (ref 65–99)
Potassium: 3.6 mmol/L (ref 3.5–5.1)
Sodium: 136 mmol/L (ref 135–145)
TOTAL PROTEIN: 8.9 g/dL — AB (ref 6.5–8.1)

## 2017-09-01 LAB — CBC WITH DIFFERENTIAL/PLATELET
BASOS ABS: 0 10*3/uL (ref 0–0.1)
BASOS PCT: 1 %
EOS ABS: 0.1 10*3/uL (ref 0–0.7)
EOS PCT: 2 %
HCT: 31.7 % — ABNORMAL LOW (ref 35.0–47.0)
Hemoglobin: 10.7 g/dL — ABNORMAL LOW (ref 12.0–16.0)
LYMPHS PCT: 19 %
Lymphs Abs: 1.1 10*3/uL (ref 1.0–3.6)
MCH: 32.2 pg (ref 26.0–34.0)
MCHC: 33.8 g/dL (ref 32.0–36.0)
MCV: 95.3 fL (ref 80.0–100.0)
MONO ABS: 0.1 10*3/uL — AB (ref 0.2–0.9)
Monocytes Relative: 2 %
Neutro Abs: 4.4 10*3/uL (ref 1.4–6.5)
Neutrophils Relative %: 76 %
PLATELETS: 329 10*3/uL (ref 150–440)
RBC: 3.33 MIL/uL — ABNORMAL LOW (ref 3.80–5.20)
RDW: 16.7 % — AB (ref 11.5–14.5)
WBC: 5.7 10*3/uL (ref 3.6–11.0)

## 2017-09-01 LAB — INFLUENZA PANEL BY PCR (TYPE A & B)
INFLAPCR: NEGATIVE
Influenza B By PCR: NEGATIVE

## 2017-09-01 MED ORDER — AMLODIPINE BESYLATE 10 MG PO TABS: 10.0000 mg | ORAL_TABLET | Freq: Every day | ORAL | 4 refills | Status: DC

## 2017-09-01 MED ORDER — ACYCLOVIR 400 MG PO TABS: ORAL_TABLET | ORAL | 6 refills | Status: DC

## 2017-09-01 MED ORDER — LEVOFLOXACIN 500 MG PO TABS: 500.0000 mg | ORAL_TABLET | Freq: Every day | ORAL | 0 refills | Status: DC

## 2017-09-01 MED ORDER — HYDROCODONE-ACETAMINOPHEN 5-325 MG PO TABS: 1.0000 | ORAL_TABLET | Freq: Three times a day (TID) | ORAL | 0 refills | Status: DC | PRN

## 2017-09-01 NOTE — Progress Notes (Signed)
[place] NOTE  Patient Care Team: Center, [facility] as PCP - General (General Practice)  CHIEF COMPLAINTS/PURPOSE OF CONSULTATION: Multiple myeloma Multiple myeloma.  #  Oncology History   # SEP 2018- MULTIPLE MYELOMA IgALamda [2.5 gm/dl; K/L= 88/1298]; STAGE III [beta 2 microglobulin=5.5] [presented with acute renal failure; anemia; NO hypercalcemia; Skeletal survey-Normal]; BMBx- 45% plasma cells; FISH-POSITIVE 11:14 translocation.[STANDARD-high RISK]/cyto-Normal; SEP 2018- PET- L3 posterior element lesion.   # 9/14- velcade SQ twice weekly/Dex 40 mg/week; OCT 5th 2018-Start R [21m]VD; 3cycles of RVD- PARTAL RESPONSE  # Jan 11th 2019-Dara-Rev-Dex   # Acute renal failure [Dr.Singh; Proteinuria 1.5gm/day ]; acyclovir/Asprin     Multiple myeloma in relapse (HCC)     HISTORY OF PRESENTING ILLNESS:  Brandi OSTROFF89y.o.  female multiple myeloma stage III transplant eligible currently on Dara-rev-Dex is here for follow-up.  Patient noted to have worsening cough fevers chills cold sweats over the last week or so.  [place] so far.   She also complains of pain worse; not left hip- sores all over; "sinuses"; couging- dry; short of breath; feel weak. Sinuses sfuffed.. ? Fevers/ sweating.   ROS: A complete 10 point review of system is done which is negative except mentioned above in history of present illness  MEDICAL HISTORY:  Past Medical History:  Diagnosis Date  . Hypertension   . Multiple myeloma (HCC)     SURGICAL HISTORY: No past surgical history on file.  SOCIAL HISTORY:  [place] Her daughter lives close by. She smokes about 4-5 cigarettes a day. No alcohol. She used to work in a [employer] Currently retired.  Social History   Socioeconomic History  . Marital status: Single    Spouse name: Not on file  . Number of children: Not on file  . Years of education: Not on  file  . Highest education level: Not on file  Social Needs  . Financial resource strain: Not on file  . Food insecurity - worry: Not on file  . Food insecurity - inability: Not on file  . Transportation needs - medical: Not on file  . Transportation needs - non-medical: Not on file  Occupational History  . Not on file  Tobacco Use  . Smoking status: Current Every Day Smoker    Packs/day: 0.50    Types: Cigarettes  . Smokeless tobacco: Never Used  Substance and Sexual Activity  . Alcohol use: No  . Drug use: No  . Sexual activity: Not on file  Other Topics Concern  . Not on file  Social History Narrative   Lives at home with family. Independent at baseline    FAMILY HISTORY: Family History  Problem Relation Age of Onset  . Pneumonia Mother   . Seizures Father     ALLERGIES:  has No Known Allergies.  MEDICATIONS:  Current Outpatient Medications  Medication Sig Dispense Refill  . acetaminophen (TYLENOL) 500 MG tablet Take 1 tablet (500 mg total) by mouth every 6 (six) hours as needed (TAKE 1 TABLET DAILY X 4 DAYS-STARTING 2 DAYS PRIOR TO CHEMOTHERAPY.). (Patient taking differently: Take 1,000 mg by mouth every 6 (six) hours as needed (TAKE 1 TABLET DAILY X 4 DAYS-STARTING 2 DAYS PRIOR TO CHEMOTHERAPY.). ) 30 tablet 0  . acyclovir (ZOVIRAX) 400 MG tablet One pill a day [to prevent shingles] 30 tablet 6  . amLODipine (NORVASC) 10 MG tablet Take 1 tablet (10 mg  total) by mouth daily. 30 tablet 4  . cyanocobalamin 100 MCG tablet Take 1 tablet (100 mcg total) by mouth daily. 30 tablet 0  . dexamethasone (DECADRON) 4 MG tablet Take 10 tablets (40 mg total) by mouth once a week. 10 pills in AM with food. (Patient taking differently: Take 4 mg by mouth 2 (two) times daily. Take with food - (TAKE 1 TABLET IN THE AM AND 1 TABLET IN THE PM-)  start on 2 days prior to chemotherapy infusion and take for a total of 4 days).) 40 tablet 4  . feeding supplement, ENSURE ENLIVE, (ENSURE ENLIVE)  LIQD Take 237 mLs by mouth 2 (two) times daily between meals. 60 Bottle 0  . loratadine (CLARITIN) 10 MG tablet Take 1 tablet (10 mg total) by mouth daily. Take daily x 4 days (start 2 days prior to chemo) 30 tablet 3  . montelukast (SINGULAIR) 10 MG tablet Take 1 tablet (10 mg total) by mouth at bedtime. Start 2 days prior to chemo. Take for 4 days total 30 tablet 3  . pantoprazole (PROTONIX) 40 MG tablet Take 1 tablet (40 mg total) by mouth daily.    . potassium chloride SA (K-DUR,KLOR-CON) 20 MEQ tablet Take 1 tablet (20 mEq total) by mouth 2 (two) times daily. 30 tablet 0  . ranitidine (ZANTAC) 150 MG tablet Take 1 tablet (150 mg total) by mouth 2 (two) times daily. START TAKING 2 DAYS PRIOR TO CHEMOTHERAPY. TAKE FOR A TOTAL OF 4 DAYS    . REVLIMID 20 MG capsule Take 1 tablet by mouth daily. x14 days    . temazepam (RESTORIL) 7.5 MG capsule Take 1 capsule (7.5 mg total) by mouth at bedtime. 30 capsule 0  . dronabinol (MARINOL) 5 MG capsule Take 1 capsule (5 mg total) by mouth 2 (two) times daily before lunch and supper. (Patient not taking: Reported on 08/04/2017) 60 capsule 3  . HYDROcodone-acetaminophen (NORCO) 5-325 MG tablet Take 1 tablet by mouth every 8 (eight) hours as needed for moderate pain. 60 tablet 0  . levofloxacin (LEVAQUIN) 500 MG tablet Take 1 tablet (500 mg total) by mouth daily. 7 tablet 0  . polyethylene glycol (MIRALAX / GLYCOLAX) packet Take 17 g by mouth daily as needed for mild constipation. (Patient not taking: Reported on 08/04/2017) 14 each 0   No current facility-administered medications for this visit.       [name]  PHYSICAL EXAMINATION: ECOG PERFORMANCE STATUS: 1 - Symptomatic but completely ambulatory  Vitals:   09/01/17 0930  BP: (!) 151/89  Pulse: 89  Resp: (!) 22  Temp: 98.9 F (37.2 C)  SpO2: 96%   Filed Weights   09/01/17 0944  Weight: 145 lb (65.8 kg)    GENERAL: Well-nourished well-developed; Alert, no distress and comfortable. She is accompanied  by family. EYES: no pallor or icterus OROPHARYNX: no thrush or ulceration; good dentition  NECK: supple, no masses felt LYMPH:  no palpable lymphadenopathy in the cervical, axillary or inguinal regions LUNGS: clear to auscultation and  No wheeze or crackles HEART/CVS: regular rate & rhythm and no murmurs; No lower extremity edema ABDOMEN: abdomen soft, non-tender and normal bowel sounds Musculoskeletal:no cyanosis of digits and no clubbing  PSYCH: alert & oriented x 3 with fluent speech NEURO: no focal motor/sensory deficits SKIN: Faint rash on her torso/   LABORATORY DATA:  I have reviewed the data as listed Lab Results  Component Value Date   WBC 5.2 09/08/2017   HGB 10.0 (L) 09/08/2017  HCT 29.8 (L) 09/08/2017   MCV 95.0 09/08/2017   PLT 321 09/08/2017   Recent Labs    08/26/17 0824 09/01/17 0906 09/08/17 0951  NA 138 136 137  K 3.5 3.6 3.5  CL 104 102 104  CO2 '25 22 23  ' GLUCOSE 100* 107* 99  BUN '9 12 15  ' CREATININE 0.57 0.85 0.77  CALCIUM 8.3* 9.4 8.3*  GFRNONAA >60 >60 >60  GFRAA >60 >60 >60  PROT 8.4* 8.9* 8.1  ALBUMIN 3.2* 3.5 3.4*  AST 41 22 18  ALT 69* 31 13*  ALKPHOS 75 91 72  BILITOT 0.4 0.4 0.4    RADIOGRAPHIC STUDIES: I have personally reviewed the radiological images as listed and agreed with the findings in the report. Dg Chest 2 View  Result Date: 09/01/2017 CLINICAL DATA:  Productive cough for the past 5 days with fever and body aches. EXAM: CHEST  2 VIEW COMPARISON:  07/29/2017 FINDINGS: The cardiac silhouette, mediastinal and hilar contours are within normal limits and stable. Stable tortuosity and calcification of the thoracic aorta. Streaky left basilar density suspicious for infiltrate. The right lung is clear. The bony thorax is intact. IMPRESSION: Left basilar infiltrate. Electronically Signed   By: Marijo Sanes M.D.   On: 09/01/2017 12:11   IMPRESSION: 1. There is an FDG avid lytic lesion in the right posterior element of L3 consistent  with myelomatous involvement. More mild uptake without CT correlate in L5 is nonspecific. Recommend attention on follow-up. No other bony involvement identified. 2. Atherosclerotic changes seen in the aorta. Coronary artery calcifications. 3. Nodular uptake in the skin and subcutaneous tissues of the right pelvis, likely an injection site. Recommend clinical correlation to exclude a skin lesion. Aortic Atherosclerosis (ICD10-I70.0).   Electronically Signed   By: Dorise Bullion III M.D   On: 04/21/2017 15:09  -------------------------- 05/12/2017  Results for TAYTE, CHILDERS (MRN [id number]) as of 07/07/2017 21:40  Ref. Range 04/12/2017 13:25 04/15/2017 10:14 04/21/2017 10:21 04/21/2017 12:17 04/22/2017 13:34 04/22/2017 14:15 04/29/2017 10:10 04/29/2017 10:16 05/06/2017 08:40 05/12/2017 10:48 05/19/2017 13:03 05/26/2017 08:30 06/02/2017 09:38 06/09/2017 09:00 06/15/2017 08:18 06/23/2017 09:28 07/07/2017 10:22  Kappa free light chain Latest Ref Range: 3.3 - 19.4 mg/L        56.9 (H)    38.6 (H)    33.7 (H)   Lamda free light chains Latest Ref Range: 5.7 - 26.3 mg/L        828.9 (H)    944.2 (H)    739.8 (H)   Kappa, lamda light chain ratio Latest Ref Range: 0.26 - 1.65         0.07 (L)    0.04 (L)    0.05 (L)   M Protein SerPl Elph-Mcnc Latest Ref Range: Not Observed g/dL       3.2 (H)     2.9 (H)    2.2 (H)       ASSESSMENT & PLAN:   Multiple myeloma in relapse (HCC) # Multiple myeloma-stage III. Transplant eligible- IgA lambda [sep 9233]; cytogenetics/fish- 11:14-STANDARD/? High RISK]; pretreatment-M protein 3.7; lambda light chain 1298.   #Currently on second line therapy with Rev-dara-dex-given partial response to RVD; started rev 2/3 [sunday]  # HOLD rev/dara today- given feeling poorly; cxr-today; levofloaxin; mucinex; claritin; stop smoking.   #Right hip pain-MRI shows lesion-however given the improvement in pain hold of radiation.  # Skeletal lesion- on  X [place]- proceed today  ca- 8.6 recommend ca+vit D  # Infectious prophylaxis- continue acyclovir/ DVT prohylaxis-  on asprin 325 mg/day.   # follow up in 1 week/ labs/dara. HOLD treatmnet;   Addendum: CXR- left basilar infiltrate. Plan as above.   All questions were answered. The patient knows to call the clinic with any problems, questions or concerns.    [name], MD 09/08/2017 10:38 AM

## 2017-09-01 NOTE — Assessment & Plan Note (Addendum)
#  Multiple myeloma-stage III. Transplant eligible- IgA lambda [sep 7412]; cytogenetics/fish- 11:14-STANDARD/? High RISK]; pretreatment-M protein 3.7; lambda light chain 1298.   #Currently on second line therapy with Rev-dara-dex-given partial response to RVD; started rev 2/3 [sunday]  # HOLD rev/dara today- given feeling poorly; cxr-today; levofloaxin; mucinex; claritin; stop smoking.   #Right hip pain-MRI shows lesion-however given the improvement in pain hold of radiation.  # Skeletal lesion- on  X [place]- proceed today ca- 8.6 recommend ca+vit D  # Infectious prophylaxis- continue acyclovir/ DVT prohylaxis- on asprin 325 mg/day.   # follow up in 1 week/ labs/dara. HOLD treatmnet;   Addendum: CXR- left basilar infiltrate. Plan as above.

## 2017-09-01 NOTE — Progress Notes (Signed)
New Revlimid cycle began 08/28/17; last dosing taking this am. Pt reports upper resp. Nasal congestion; sinus pressure, productive cough, chills dyspnea x 1 week. Her sats are 96% RA.  She does not have a thermometer at home. I provided her with a free thermometer. Pt took tylenol 500 mg daily x 3-4 days. She is also using norco for pain. We discussed that the tylenol could be masking a fever. Pt encouraged to check her temperature before taking tylenol. Pt reports over all body aches. Pt states that the norco is not helping her pain.

## 2017-09-02 ENCOUNTER — Inpatient Hospital Stay: Payer: Medicare Other

## 2017-09-02 LAB — KAPPA/LAMBDA LIGHT CHAINS
KAPPA FREE LGHT CHN: 29.2 mg/L — AB (ref 3.3–19.4)
Kappa, lambda light chain ratio: 0.07 — ABNORMAL LOW (ref 0.26–1.65)
LAMDA FREE LIGHT CHAINS: 391.3 mg/L — AB (ref 5.7–26.3)

## 2017-09-05 LAB — MULTIPLE MYELOMA PANEL, SERUM
ALBUMIN SERPL ELPH-MCNC: 3.3 g/dL (ref 2.9–4.4)
ALPHA 1: 0.2 g/dL (ref 0.0–0.4)
Albumin/Glob SerPl: 0.7 (ref 0.7–1.7)
Alpha2 Glob SerPl Elph-Mcnc: 1.1 g/dL — ABNORMAL HIGH (ref 0.4–1.0)
B-Globulin SerPl Elph-Mcnc: 3.1 g/dL — ABNORMAL HIGH (ref 0.7–1.3)
Gamma Glob SerPl Elph-Mcnc: 0.4 g/dL (ref 0.4–1.8)
Globulin, Total: 4.8 g/dL — ABNORMAL HIGH (ref 2.2–3.9)
IGA: 1522 mg/dL — AB (ref 87–352)
IGM (IMMUNOGLOBULIN M), SRM: 8 mg/dL — AB (ref 26–217)
IgG (Immunoglobin G), Serum: 596 mg/dL — ABNORMAL LOW (ref 700–1600)
M Protein SerPl Elph-Mcnc: 2.4 g/dL — ABNORMAL HIGH
Total Protein ELP: 8.1 g/dL (ref 6.0–8.5)

## 2017-09-08 ENCOUNTER — Inpatient Hospital Stay: Payer: Medicare Other

## 2017-09-08 ENCOUNTER — Inpatient Hospital Stay: Payer: Medicare Other | Admitting: Internal Medicine

## 2017-09-08 ENCOUNTER — Telehealth: Payer: Self-pay | Admitting: *Deleted

## 2017-09-08 DIAGNOSIS — C9002 Multiple myeloma in relapse: Secondary | ICD-10-CM | POA: Diagnosis not present

## 2017-09-08 DIAGNOSIS — Z5112 Encounter for antineoplastic immunotherapy: Secondary | ICD-10-CM | POA: Diagnosis not present

## 2017-09-08 LAB — COMPREHENSIVE METABOLIC PANEL
ALK PHOS: 72 U/L (ref 38–126)
ALT: 13 U/L — AB (ref 14–54)
ANION GAP: 10 (ref 5–15)
AST: 18 U/L (ref 15–41)
Albumin: 3.4 g/dL — ABNORMAL LOW (ref 3.5–5.0)
BUN: 15 mg/dL (ref 6–20)
CALCIUM: 8.3 mg/dL — AB (ref 8.9–10.3)
CO2: 23 mmol/L (ref 22–32)
CREATININE: 0.77 mg/dL (ref 0.44–1.00)
Chloride: 104 mmol/L (ref 101–111)
GFR calc Af Amer: >60 mL/min (ref >60)
Glucose, Bld: 99 mg/dL (ref 65–99)
Potassium: 3.5 mmol/L (ref 3.5–5.1)
SODIUM: 137 mmol/L (ref 135–145)
Total Bilirubin: 0.4 mg/dL (ref 0.3–1.2)
Total Protein: 8.1 g/dL (ref 6.5–8.1)

## 2017-09-08 LAB — CBC WITH DIFFERENTIAL/PLATELET
BASOS ABS: 0 10*3/uL (ref 0–0.1)
BASOS PCT: 1 %
EOS ABS: 0.1 10*3/uL (ref 0–0.7)
Eosinophils Relative: 1 %
HEMATOCRIT: 29.8 % — AB (ref 35.0–47.0)
HEMOGLOBIN: 10 g/dL — AB (ref 12.0–16.0)
Lymphocytes Relative: 51 %
Lymphs Abs: 2.6 10*3/uL (ref 1.0–3.6)
MCH: 31.8 pg (ref 26.0–34.0)
MCHC: 33.5 g/dL (ref 32.0–36.0)
MCV: 95 fL (ref 80.0–100.0)
Monocytes Absolute: 0.4 10*3/uL (ref 0.2–0.9)
Monocytes Relative: 8 %
NEUTROS ABS: 2 10*3/uL (ref 1.4–6.5)
NEUTROS PCT: 39 %
Platelets: 321 10*3/uL (ref 150–440)
RBC: 3.14 MIL/uL — AB (ref 3.80–5.20)
RDW: 16.7 % — ABNORMAL HIGH (ref 11.5–14.5)
WBC: 5.2 10*3/uL (ref 3.6–11.0)

## 2017-09-08 NOTE — Telephone Encounter (Signed)
Patient left clinic w/o being seen today. Came for labs and left bldg. Call attempt x 2 to reach patient. Unable to leave vm for patient. Phone just rings. Patient needs to be evaluated today if possible by NP to f/u on pt's symptoms prior to chemo tommorrow.

## 2017-09-09 ENCOUNTER — Other Ambulatory Visit: Payer: Self-pay

## 2017-09-09 ENCOUNTER — Inpatient Hospital Stay: Payer: Medicare Other

## 2017-09-09 ENCOUNTER — Inpatient Hospital Stay (HOSPITAL_BASED_OUTPATIENT_CLINIC_OR_DEPARTMENT_OTHER): Payer: Medicare Other | Admitting: Internal Medicine

## 2017-09-09 ENCOUNTER — Encounter: Payer: Self-pay | Admitting: Internal Medicine

## 2017-09-09 VITALS — BP 141/83 | HR 75 | Temp 97.9°F | Resp 20

## 2017-09-09 DIAGNOSIS — C9002 Multiple myeloma in relapse: Secondary | ICD-10-CM

## 2017-09-09 DIAGNOSIS — Z5112 Encounter for antineoplastic immunotherapy: Secondary | ICD-10-CM | POA: Diagnosis not present

## 2017-09-09 DIAGNOSIS — M25551 Pain in right hip: Secondary | ICD-10-CM

## 2017-09-09 DIAGNOSIS — M899 Disorder of bone, unspecified: Secondary | ICD-10-CM

## 2017-09-09 MED ORDER — PROCHLORPERAZINE MALEATE 10 MG PO TABS
10.0000 mg | ORAL_TABLET | Freq: Once | ORAL | Status: AC
  Administered 2017-09-09: 10 mg via ORAL
  Filled 2017-09-09: qty 1

## 2017-09-09 MED ORDER — SODIUM CHLORIDE 0.9 % IV SOLN
1000.0000 mg | Freq: Once | INTRAVENOUS | Status: AC
  Administered 2017-09-09: 1000 mg via INTRAVENOUS
  Filled 2017-09-09: qty 40

## 2017-09-09 MED ORDER — DENOSUMAB 120 MG/1.7ML ~~LOC~~ SOLN
120.0000 mg | Freq: Once | SUBCUTANEOUS | Status: AC
  Administered 2017-09-09: 120 mg via SUBCUTANEOUS
  Filled 2017-09-09: qty 1.7

## 2017-09-09 MED ORDER — SODIUM CHLORIDE 0.9 % IV SOLN: 16.0000 mg/kg | Freq: Once | INTRAVENOUS | Status: DC

## 2017-09-09 MED ORDER — DIPHENHYDRAMINE HCL 25 MG PO CAPS
50.0000 mg | ORAL_CAPSULE | Freq: Once | ORAL | Status: AC
  Administered 2017-09-09: 50 mg via ORAL
  Filled 2017-09-09: qty 2

## 2017-09-09 MED ORDER — SODIUM CHLORIDE 0.9 % IV SOLN
Freq: Once | INTRAVENOUS | Status: AC
  Administered 2017-09-09: 10:00:00 via INTRAVENOUS
  Filled 2017-09-09: qty 1000

## 2017-09-09 MED ORDER — ACETAMINOPHEN 325 MG PO TABS
650.0000 mg | ORAL_TABLET | Freq: Once | ORAL | Status: AC
  Administered 2017-09-09: 650 mg via ORAL
  Filled 2017-09-09: qty 2

## 2017-09-09 MED ORDER — METHYLPREDNISOLONE SODIUM SUCC 125 MG IJ SOLR
125.0000 mg | Freq: Once | INTRAMUSCULAR | Status: AC
  Administered 2017-09-09: 125 mg via INTRAVENOUS
  Filled 2017-09-09: qty 2

## 2017-09-09 NOTE — Assessment & Plan Note (Addendum)
#  Multiple myeloma-stage III. Transplant eligible- IgA lambda [sep 2009]; cytogenetics/fish- 11:14-STANDARD/? High RISK]; pretreatment-M protein 3.7; lambda light chain 1298.   # Currently on second line therapy with Rev-dara-dex [jan 11th]-given partial response to RVD; interruption sec to infections [bronchiis/pneumonia].  February 2019-M protein 2.4 g; lambda light chains 391. [Patient of note had multiple interruptions of her Revlimid therapy also]  # start Rev [today]; dex 40 mg weekly; dara # 5 today.   #Right hip pain-MRI shows lesion-significant improvement in pain on daratumumab.   # Skeletal lesion- on  X [place]- proceed today ca- 8.6 recommend ca+vit D  # Infectious prophylaxis- continue acyclovir/ DVT prohylaxis- on asprin 325 mg/day.   # recent pneumonia- improved s/p levaquin  # next week/dara/labs-x-geva; follow up in 2 weeks/MD/dara.  Patient will continue to keep her appointments as planned at [facility] later this month.

## 2017-09-09 NOTE — Progress Notes (Signed)
[facility] CONSULT NOTE  Patient Care Team: [facility] as PCP - General (General Practice)  CHIEF COMPLAINTS/PURPOSE OF CONSULTATION: Multiple myeloma Multiple myeloma.  #  Oncology History   # SEP 2018- MULTIPLE MYELOMA IgALamda [2.5 gm/dl; K/L= 88/1298]; STAGE III [beta 2 microglobulin=5.5] [presented with acute renal failure; anemia; NO hypercalcemia; Skeletal survey-Normal]; BMBx- 45% plasma cells; FISH-POSITIVE 11:14 translocation.[STANDARD-high RISK]/cyto-Normal; SEP 2018- PET- L3 posterior element lesion.   # 9/14- velcade SQ twice weekly/Dex 40 mg/week; OCT 5th 2018-Start R [10mg]VD; 3cycles of RVD- PARTAL RESPONSE  # Jan 11th 2019-Dara-Rev-Dex   # Acute renal failure [Dr.Singh; Proteinuria 1.5gm/day ]; acyclovir/Asprin     Multiple myeloma in relapse (HCC)     HISTORY OF PRESENTING ILLNESS:  Brandi [name] 66 y.o.  female multiple myeloma stage III transplant eligible currently on Dara-rev-Dex is here for follow-up.  Patient's treatment was held last week because of possible pneumonia.  Patient was treated with Levaquin.  Patient symptoms of cough/'pain all over"-significantly improved.  Fevers resolved.  Patient states that she has been feeling the best.  Left hip pain is improved.  No nausea no vomiting.   ROS: A complete 10 point review of system is done which is negative except mentioned above in history of present illness  MEDICAL HISTORY:  Past Medical History:  Diagnosis Date  . Hypertension   . Multiple myeloma (HCC)     SURGICAL HISTORY: History reviewed. No pertinent surgical history.  SOCIAL HISTORY:  She lives in [place] with her son. Her daughter lives close by. She smokes about 4-5 cigarettes a day. No alcohol. She used to work in a mill. Currently retired.  Social History   Socioeconomic History  . Marital status: Single    Spouse name: Not on file  . Number of children: Not on file  . Years of  education: Not on file  . Highest education level: Not on file  Social Needs  . Financial resource strain: Not on file  . Food insecurity - worry: Not on file  . Food insecurity - inability: Not on file  . Transportation needs - medical: Not on file  . Transportation needs - non-medical: Not on file  Occupational History  . Not on file  Tobacco Use  . Smoking status: Current Every Day Smoker    Packs/day: 0.50    Types: Cigarettes  . Smokeless tobacco: Never Used  Substance and Sexual Activity  . Alcohol use: No  . Drug use: No  . Sexual activity: Not on file  Other Topics Concern  . Not on file  Social History Narrative   Lives at home with family. Independent at baseline    FAMILY HISTORY: Family History  Problem Relation Age of Onset  . Pneumonia Mother   . Seizures Father     ALLERGIES:  has No Known Allergies.  MEDICATIONS:  Current Outpatient Medications  Medication Sig Dispense Refill  . acetaminophen (TYLENOL) 500 MG tablet Take 1 tablet (500 mg total) by mouth every 6 (six) hours as needed (TAKE 1 TABLET DAILY X 4 DAYS-STARTING 2 DAYS PRIOR TO CHEMOTHERAPY.). (Patient taking differently: Take 1,000 mg by mouth every 6 (six) hours as needed (TAKE 1 TABLET DAILY X 4 DAYS-STARTING 2 DAYS PRIOR TO CHEMOTHERAPY.). ) 30 tablet 0  . acyclovir (ZOVIRAX) 400 MG tablet One pill a day [to prevent shingles] 30 tablet 6  . amLODipine (NORVASC) 10 MG tablet Take 1 tablet (10 mg total) by mouth daily. 30 tablet   4  . cyanocobalamin 100 MCG tablet Take 1 tablet (100 mcg total) by mouth daily. 30 tablet 0  . dexamethasone (DECADRON) 4 MG tablet Take 10 tablets (40 mg total) by mouth once a week. 10 pills in AM with food. (Patient taking differently: Take 4 mg by mouth 2 (two) times daily. Take with food - (TAKE 1 TABLET IN THE AM AND 1 TABLET IN THE PM-)  start on 2 days prior to chemotherapy infusion and take for a total of 4 days).) 40 tablet 4  . feeding supplement, ENSURE ENLIVE,  (ENSURE ENLIVE) LIQD Take 237 mLs by mouth 2 (two) times daily between meals. 60 Bottle 0  . loratadine (CLARITIN) 10 MG tablet Take 1 tablet (10 mg total) by mouth daily. Take daily x 4 days (start 2 days prior to chemo) 30 tablet 3  . montelukast (SINGULAIR) 10 MG tablet Take 1 tablet (10 mg total) by mouth at bedtime. Start 2 days prior to chemo. Take for 4 days total 30 tablet 3  . pantoprazole (PROTONIX) 40 MG tablet Take 1 tablet (40 mg total) by mouth daily.    . potassium chloride SA (K-DUR,KLOR-CON) 20 MEQ tablet Take 1 tablet (20 mEq total) by mouth 2 (two) times daily. 30 tablet 0  . ranitidine (ZANTAC) 150 MG tablet Take 1 tablet (150 mg total) by mouth 2 (two) times daily. START TAKING 2 DAYS PRIOR TO CHEMOTHERAPY. TAKE FOR A TOTAL OF 4 DAYS    . temazepam (RESTORIL) 7.5 MG capsule Take 1 capsule (7.5 mg total) by mouth at bedtime. 30 capsule 0  . dronabinol (MARINOL) 5 MG capsule Take 1 capsule (5 mg total) by mouth 2 (two) times daily before lunch and supper. (Patient not taking: Reported on 08/04/2017) 60 capsule 3  . HYDROcodone-acetaminophen (NORCO) 5-325 MG tablet Take 1 tablet by mouth every 8 (eight) hours as needed for moderate pain. (Patient not taking: Reported on 09/09/2017) 60 tablet 0  . polyethylene glycol (MIRALAX / GLYCOLAX) packet Take 17 g by mouth daily as needed for mild constipation. (Patient not taking: Reported on 08/04/2017) 14 each 0  . REVLIMID 20 MG capsule Take 1 tablet by mouth daily. x14 days     No current facility-administered medications for this visit.       [name]  PHYSICAL EXAMINATION: ECOG PERFORMANCE STATUS: 1 - Symptomatic but completely ambulatory  Vitals:   09/09/17 0830  BP: (!) 141/83  Pulse: 75  Resp: 20  Temp: 97.9 F (36.6 C)  SpO2: 100%   There were no vitals filed for this visit.  GENERAL: Well-nourished well-developed; Alert, no distress and comfortable. She is a alone. EYES: no pallor or icterus OROPHARYNX: no thrush or  ulceration; good dentition  NECK: supple, no masses felt LYMPH:  no palpable lymphadenopathy in the cervical, axillary or inguinal regions LUNGS: clear to auscultation and  No wheeze or crackles HEART/CVS: regular rate & rhythm and no murmurs; No lower extremity edema ABDOMEN: abdomen soft, non-tender and normal bowel sounds Musculoskeletal:no cyanosis of digits and no clubbing  PSYCH: alert & oriented x 3 with fluent speech NEURO: no focal motor/sensory deficits SKIN: Faint rash on her torso/   LABORATORY DATA:  I have reviewed the data as listed Lab Results  Component Value Date   WBC 5.2 09/08/2017   HGB 10.0 (L) 09/08/2017   HCT 29.8 (L) 09/08/2017   MCV 95.0 09/08/2017   PLT 321 09/08/2017   Recent Labs    08/26/17 0824 09/01/17 7846  09/08/17 0951  NA 138 136 137  K 3.5 3.6 3.5  CL 104 102 104  CO2 '25 22 23  '$ GLUCOSE 100* 107* 99  BUN '9 12 15  '$ CREATININE 0.57 0.85 0.77  CALCIUM 8.3* 9.4 8.3*  GFRNONAA >60 >60 >60  GFRAA >60 >60 >60  PROT 8.4* 8.9* 8.1  ALBUMIN 3.2* 3.5 3.4*  AST 41 22 18  ALT 69* 31 13*  ALKPHOS 75 91 72  BILITOT 0.4 0.4 0.4    RADIOGRAPHIC STUDIES: I have personally reviewed the radiological images as listed and agreed with the findings in the report. Dg Chest 2 View  Result Date: 09/01/2017 CLINICAL DATA:  Productive cough for the past 5 days with fever and body aches. EXAM: CHEST  2 VIEW COMPARISON:  07/29/2017 FINDINGS: The cardiac silhouette, mediastinal and hilar contours are within normal limits and stable. Stable tortuosity and calcification of the thoracic aorta. Streaky left basilar density suspicious for infiltrate. The right lung is clear. The bony thorax is intact. IMPRESSION: Left basilar infiltrate. Electronically Signed   By: Marijo Sanes M.D.   On: 09/01/2017 12:11   IMPRESSION: 1. There is an FDG avid lytic lesion in the right posterior element of L3 consistent with myelomatous involvement. More mild uptake without CT  correlate in L5 is nonspecific. Recommend attention on follow-up. No other bony involvement identified. 2. Atherosclerotic changes seen in the aorta. Coronary artery calcifications. 3. Nodular uptake in the skin and subcutaneous tissues of the right pelvis, likely an injection site. Recommend clinical correlation to exclude a skin lesion. Aortic Atherosclerosis (ICD10-I70.0).   Electronically Signed   By: Dorise Bullion III M.D   On: 04/21/2017 15:09  --------------------------  Results for DUANA, BENEDICT (MRN [id number]) as of 09/10/2017 04:09  Ref. Range 04/05/2017 22:43 04/06/2017 14:12 04/10/2017 11:17 04/29/2017 10:10 04/29/2017 10:16 05/26/2017 08:30 06/23/2017 09:28 07/22/2017 11:40 09/01/2017 08:58 09/01/2017 09:06  M Protein SerPl Elph-Mcnc Latest Ref Range: Not Observed g/dL    3.2 (H)  2.9 (H) 2.2 (H) 2.4 (H) 2.4 (H)     Results for GESSELLE, FITZSIMONS (MRN [id number]) as of 09/10/2017 04:09  Ref. Range 04/05/2017 22:43 04/06/2017 14:12 04/10/2017 11:17 04/29/2017 10:10 04/29/2017 10:16 05/26/2017 08:30 06/23/2017 09:28 07/22/2017 11:40 09/01/2017 08:58 09/01/2017 09:06  Kappa free light chain Latest Ref Range: 3.3 - 19.4 mg/L  88.6 (H)   56.9 (H) 38.6 (H) 33.7 (H) 23.0 (H)  29.2 (H)  Lamda free light chains Latest Ref Range: 5.7 - 26.3 mg/L  1,298.8 (H)   828.9 (H) 944.2 (H) 739.8 (H) 556.6 (H)  391.3 (H)  Kappa, lamda light chain ratio Latest Ref Range: 0.26 - 1.65   0.07 (L)   0.07 (L) 0.04 (L) 0.05 (L) 0.04 (L)  0.07 (L)      ASSESSMENT & PLAN:   Multiple myeloma in relapse (HCC) # Multiple myeloma-stage III. Transplant eligible- IgA lambda [sep 3007]; cytogenetics/fish- 11:14-STANDARD/? High RISK]; pretreatment-M protein 3.7; lambda light chain 1298.   # Currently on second line therapy with Rev-dara-dex [jan 11th]-given partial response to RVD; interruption sec to infections [bronchiis/pneumonia].  February 2019-M protein 2.4 g; lambda light chains 391. [Patient of note had multiple  interruptions of her Revlimid therapy also]  # start Rev [today]; dex 40 mg weekly; dara # 5 today.   #Right hip pain-MRI shows lesion-significant improvement in pain on daratumumab.   # Skeletal lesion- on  X [place]- proceed today ca- 8.6 recommend ca+vit D  # Infectious prophylaxis- continue acyclovir/  DVT prohylaxis- on asprin 325 mg/day.   # recent pneumonia- improved s/p levaquin  # next week/dara/labs-x-geva; follow up in 2 weeks/MD/dara.  Patient will continue to keep her appointments as planned at [facility] later this month.  All questions were answered. The patient knows to call the clinic with any problems, questions or concerns.    [name], MD 09/10/2017 4:14 AM

## 2017-09-10 ENCOUNTER — Other Ambulatory Visit: Payer: Self-pay | Admitting: Internal Medicine

## 2017-09-10 DIAGNOSIS — C9002 Multiple myeloma in relapse: Secondary | ICD-10-CM

## 2017-09-16 ENCOUNTER — Inpatient Hospital Stay: Payer: Medicare Other

## 2017-09-16 VITALS — BP 115/65 | HR 88 | Temp 97.9°F | Resp 18 | Wt 148.2 lb

## 2017-09-16 DIAGNOSIS — C9002 Multiple myeloma in relapse: Secondary | ICD-10-CM

## 2017-09-16 DIAGNOSIS — Z5112 Encounter for antineoplastic immunotherapy: Secondary | ICD-10-CM | POA: Diagnosis not present

## 2017-09-16 DIAGNOSIS — C9000 Multiple myeloma not having achieved remission: Secondary | ICD-10-CM | POA: Diagnosis not present

## 2017-09-16 LAB — CBC WITH DIFFERENTIAL/PLATELET
BASOS ABS: 0 10*3/uL (ref 0–0.1)
BASOS PCT: 0 %
EOS ABS: 0.5 10*3/uL (ref 0–0.7)
EOS PCT: 7 %
HEMATOCRIT: 29.9 % — AB (ref 35.0–47.0)
Hemoglobin: 10 g/dL — ABNORMAL LOW (ref 12.0–16.0)
Lymphocytes Relative: 48 %
Lymphs Abs: 3 10*3/uL (ref 1.0–3.6)
MCH: 32.3 pg (ref 26.0–34.0)
MCHC: 33.4 g/dL (ref 32.0–36.0)
MCV: 97 fL (ref 80.0–100.0)
MONO ABS: 0.2 10*3/uL (ref 0.2–0.9)
MONOS PCT: 3 %
Neutro Abs: 2.6 10*3/uL (ref 1.4–6.5)
Neutrophils Relative %: 42 %
PLATELETS: 257 10*3/uL (ref 150–440)
RBC: 3.08 MIL/uL — ABNORMAL LOW (ref 3.80–5.20)
RDW: 17.2 % — AB (ref 11.5–14.5)
WBC: 6.2 10*3/uL (ref 3.6–11.0)

## 2017-09-16 LAB — COMPREHENSIVE METABOLIC PANEL
ALBUMIN: 3.2 g/dL — AB (ref 3.5–5.0)
ALT: 27 U/L (ref 14–54)
ANION GAP: 10 (ref 5–15)
AST: 33 U/L (ref 15–41)
Alkaline Phosphatase: 67 U/L (ref 38–126)
BILIRUBIN TOTAL: 0.3 mg/dL (ref 0.3–1.2)
BUN: 11 mg/dL (ref 6–20)
CHLORIDE: 102 mmol/L (ref 101–111)
CO2: 26 mmol/L (ref 22–32)
Calcium: 8.6 mg/dL — ABNORMAL LOW (ref 8.9–10.3)
Creatinine, Ser: 0.72 mg/dL (ref 0.44–1.00)
GFR calc Af Amer: >60 mL/min (ref >60)
GFR calc non Af Amer: >60 mL/min (ref >60)
Glucose, Bld: 108 mg/dL — ABNORMAL HIGH (ref 65–99)
POTASSIUM: 3 mmol/L — AB (ref 3.5–5.1)
Sodium: 138 mmol/L (ref 135–145)
TOTAL PROTEIN: 7.7 g/dL (ref 6.5–8.1)

## 2017-09-16 MED ORDER — ALBUTEROL SULFATE (2.5 MG/3ML) 0.083% IN NEBU
2.5000 mg | INHALATION_SOLUTION | Freq: Once | RESPIRATORY_TRACT | Status: AC
  Administered 2017-09-16: 2.5 mg via RESPIRATORY_TRACT
  Filled 2017-09-16: qty 3

## 2017-09-16 MED ORDER — DIPHENHYDRAMINE HCL 25 MG PO CAPS
50.0000 mg | ORAL_CAPSULE | Freq: Once | ORAL | Status: AC
  Administered 2017-09-16: 50 mg via ORAL
  Filled 2017-09-16: qty 2

## 2017-09-16 MED ORDER — SODIUM CHLORIDE 0.9 % IV SOLN
Freq: Once | INTRAVENOUS | Status: AC
  Administered 2017-09-16: 09:00:00 via INTRAVENOUS
  Filled 2017-09-16: qty 1000

## 2017-09-16 MED ORDER — METHYLPREDNISOLONE SODIUM SUCC 125 MG IJ SOLR
125.0000 mg | Freq: Once | INTRAMUSCULAR | Status: AC
  Administered 2017-09-16: 125 mg via INTRAVENOUS
  Filled 2017-09-16: qty 2

## 2017-09-16 MED ORDER — ACETAMINOPHEN 325 MG PO TABS
650.0000 mg | ORAL_TABLET | Freq: Once | ORAL | Status: AC
  Administered 2017-09-16: 650 mg via ORAL
  Filled 2017-09-16: qty 2

## 2017-09-16 MED ORDER — SODIUM CHLORIDE 0.9 % IV SOLN: 16.0000 mg/kg | Freq: Once | INTRAVENOUS | Status: DC

## 2017-09-16 MED ORDER — SODIUM CHLORIDE 0.9 % IV SOLN
1000.0000 mg | Freq: Once | INTRAVENOUS | Status: AC
  Administered 2017-09-16: 1000 mg via INTRAVENOUS
  Filled 2017-09-16: qty 40

## 2017-09-16 MED ORDER — PROCHLORPERAZINE MALEATE 10 MG PO TABS
10.0000 mg | ORAL_TABLET | Freq: Once | ORAL | Status: AC
  Administered 2017-09-16: 10 mg via ORAL
  Filled 2017-09-16: qty 1

## 2017-09-16 NOTE — Progress Notes (Signed)
Pt reports SOB that has not worsened since last visit with MD but has not improved. Potassium 3.0. Dr. Rogue Bussing aware and Beckey Rutter NP at chairside to assess. Okay to proceed with treatment Per Lauren NP, pt to receive breathing treatment and pt educated on the importance of taking home potassium as prescribed.  Pt reports "feeling better" after breathing treatment. Pt and VS stable at discharge.

## 2017-09-20 ENCOUNTER — Other Ambulatory Visit: Payer: Self-pay | Admitting: *Deleted

## 2017-09-20 DIAGNOSIS — C9002 Multiple myeloma in relapse: Secondary | ICD-10-CM

## 2017-09-20 MED ORDER — POTASSIUM CHLORIDE CRYS ER 20 MEQ PO TBCR: 20.0000 meq | EXTENDED_RELEASE_TABLET | Freq: Two times a day (BID) | ORAL | 3 refills | Status: DC

## 2017-09-20 NOTE — Telephone Encounter (Signed)
Requesting refill of her Potassium she takes twice a day and wants more than 2 week supply. Please advise  Dx:  Multiple myeloma in relapse ([place])   Ref Range & Units 4d ago  Potassium 3.5 - 5.1 mmol/L 3.0 Abnormally low

## 2017-09-22 ENCOUNTER — Encounter: Payer: Self-pay | Admitting: *Deleted

## 2017-09-22 ENCOUNTER — Inpatient Hospital Stay: Payer: Medicare Other

## 2017-09-22 ENCOUNTER — Inpatient Hospital Stay (HOSPITAL_BASED_OUTPATIENT_CLINIC_OR_DEPARTMENT_OTHER): Payer: Medicare Other | Admitting: Internal Medicine

## 2017-09-22 ENCOUNTER — Encounter: Payer: Self-pay | Admitting: Internal Medicine

## 2017-09-22 VITALS — BP 119/78 | HR 84 | Temp 97.9°F | Resp 16 | Wt 147.6 lb

## 2017-09-22 DIAGNOSIS — C9002 Multiple myeloma in relapse: Secondary | ICD-10-CM | POA: Diagnosis not present

## 2017-09-22 DIAGNOSIS — M899 Disorder of bone, unspecified: Secondary | ICD-10-CM

## 2017-09-22 DIAGNOSIS — C9000 Multiple myeloma not having achieved remission: Secondary | ICD-10-CM | POA: Diagnosis not present

## 2017-09-22 DIAGNOSIS — Z5112 Encounter for antineoplastic immunotherapy: Secondary | ICD-10-CM | POA: Diagnosis not present

## 2017-09-22 DIAGNOSIS — M25551 Pain in right hip: Secondary | ICD-10-CM | POA: Diagnosis not present

## 2017-09-22 LAB — COMPREHENSIVE METABOLIC PANEL
ALBUMIN: 3.3 g/dL — AB (ref 3.5–5.0)
ALT: 31 U/L (ref 14–54)
ANION GAP: 10 (ref 5–15)
AST: 25 U/L (ref 15–41)
Alkaline Phosphatase: 69 U/L (ref 38–126)
BUN: 13 mg/dL (ref 6–20)
CHLORIDE: 104 mmol/L (ref 101–111)
CO2: 23 mmol/L (ref 22–32)
Calcium: 8.5 mg/dL — ABNORMAL LOW (ref 8.9–10.3)
Creatinine, Ser: 0.77 mg/dL (ref 0.44–1.00)
GFR calc Af Amer: >60 mL/min (ref >60)
GFR calc non Af Amer: >60 mL/min (ref >60)
GLUCOSE: 85 mg/dL (ref 65–99)
POTASSIUM: 3.4 mmol/L — AB (ref 3.5–5.1)
SODIUM: 137 mmol/L (ref 135–145)
Total Bilirubin: 0.4 mg/dL (ref 0.3–1.2)
Total Protein: 8.1 g/dL (ref 6.5–8.1)

## 2017-09-22 LAB — CBC WITH DIFFERENTIAL/PLATELET
Basophils Absolute: 0 10*3/uL (ref 0–0.1)
Basophils Relative: 1 %
EOS PCT: 4 %
Eosinophils Absolute: 0.3 10*3/uL (ref 0–0.7)
HEMATOCRIT: 29.2 % — AB (ref 35.0–47.0)
Hemoglobin: 10 g/dL — ABNORMAL LOW (ref 12.0–16.0)
LYMPHS ABS: 3 10*3/uL (ref 1.0–3.6)
LYMPHS PCT: 47 %
MCH: 32.6 pg (ref 26.0–34.0)
MCHC: 34.2 g/dL (ref 32.0–36.0)
MCV: 95.4 fL (ref 80.0–100.0)
Monocytes Absolute: 0.2 10*3/uL (ref 0.2–0.9)
Monocytes Relative: 3 %
NEUTROS ABS: 2.8 10*3/uL (ref 1.4–6.5)
Neutrophils Relative %: 45 %
PLATELETS: 270 10*3/uL (ref 150–440)
RBC: 3.07 MIL/uL — AB (ref 3.80–5.20)
RDW: 17 % — ABNORMAL HIGH (ref 11.5–14.5)
WBC: 6.2 10*3/uL (ref 3.6–11.0)

## 2017-09-22 NOTE — Progress Notes (Signed)
[facility] CONSULT NOTE  Patient Care Team: [facility] as PCP - General (General Practice)  CHIEF COMPLAINTS/PURPOSE OF CONSULTATION: Multiple myeloma Multiple myeloma.  #  Oncology History   # SEP 2018- MULTIPLE MYELOMA IgALamda [2.5 gm/dl; K/L= 88/1298]; STAGE III [beta 2 microglobulin=5.5] [presented with acute renal failure; anemia; NO hypercalcemia; Skeletal survey-Normal]; BMBx- 45% plasma cells; FISH-POSITIVE 11:14 translocation.[STANDARD-high RISK]/cyto-Normal; SEP 2018- PET- L3 posterior element lesion.   # 9/14- velcade SQ twice weekly/Dex 40 mg/week; OCT 5th 2018-Start R [10mg]VD; 3cycles of RVD- PARTAL RESPONSE  # Jan 11th 2019-Dara-Rev-Dex   # Acute renal failure [Dr.Singh; Proteinuria 1.5gm/day ]; acyclovir/Asprin     Multiple myeloma in relapse (HCC)     HISTORY OF PRESENTING ILLNESS:  Brandi [name] 66 y.o.  female multiple myeloma stage III transplant eligible currently on Dara-rev-Dex is here for follow-up.  Patient's treatment was held last week because of possible pneumonia.  Patient was treated with Levaquin.  Patient symptoms of cough/'pain all over"-significantly improved.  Fevers resolved.  Patient states that she has been feeling the best.  Left hip pain is improved.  No nausea no vomiting.   ROS: A complete 10 point review of system is done which is negative except mentioned above in history of present illness  MEDICAL HISTORY:  Past Medical History:  Diagnosis Date  . Hypertension   . Multiple myeloma (HCC)     SURGICAL HISTORY: History reviewed. No pertinent surgical history.  SOCIAL HISTORY:  She lives in [place] with her son. Her daughter lives close by. She smokes about 4-5 cigarettes a day. No alcohol. She used to work in a mill. Currently retired.  Social History   Socioeconomic History  . Marital status: Single    Spouse name: Not on file  . Number of children: Not on file  . Years of  education: Not on file  . Highest education level: Not on file  Social Needs  . Financial resource strain: Not on file  . Food insecurity - worry: Not on file  . Food insecurity - inability: Not on file  . Transportation needs - medical: Not on file  . Transportation needs - non-medical: Not on file  Occupational History  . Not on file  Tobacco Use  . Smoking status: Current Every Day Smoker    Packs/day: 0.50    Types: Cigarettes  . Smokeless tobacco: Never Used  Substance and Sexual Activity  . Alcohol use: No  . Drug use: No  . Sexual activity: Not on file  Other Topics Concern  . Not on file  Social History Narrative   Lives at home with family. Independent at baseline    FAMILY HISTORY: Family History  Problem Relation Age of Onset  . Pneumonia Mother   . Seizures Father     ALLERGIES:  has No Known Allergies.  MEDICATIONS:  Current Outpatient Medications  Medication Sig Dispense Refill  . acetaminophen (TYLENOL) 500 MG tablet Take 1 tablet (500 mg total) by mouth every 6 (six) hours as needed (TAKE 1 TABLET DAILY X 4 DAYS-STARTING 2 DAYS PRIOR TO CHEMOTHERAPY.). (Patient taking differently: Take 1,000 mg by mouth every 6 (six) hours as needed (TAKE 1 TABLET DAILY X 4 DAYS-STARTING 2 DAYS PRIOR TO CHEMOTHERAPY.). ) 30 tablet 0  . acyclovir (ZOVIRAX) 400 MG tablet One pill a day [to prevent shingles] 30 tablet 6  . amLODipine (NORVASC) 10 MG tablet Take 1 tablet (10 mg total) by mouth daily. 30 tablet   4  . cyanocobalamin 100 MCG tablet Take 1 tablet (100 mcg total) by mouth daily. 30 tablet 0  . dexamethasone (DECADRON) 4 MG tablet Take 10 tablets (40 mg total) by mouth once a week. 10 pills in AM with food. (Patient taking differently: Take 4 mg by mouth 2 (two) times daily. Take with food - (TAKE 1 TABLET IN THE AM AND 1 TABLET IN THE PM-)  start on 2 days prior to chemotherapy infusion and take for a total of 4 days).) 40 tablet 4  . dronabinol (MARINOL) 5 MG capsule  Take 1 capsule (5 mg total) by mouth 2 (two) times daily before lunch and supper. 60 capsule 3  . feeding supplement, ENSURE ENLIVE, (ENSURE ENLIVE) LIQD Take 237 mLs by mouth 2 (two) times daily between meals. 60 Bottle 0  . HYDROcodone-acetaminophen (NORCO) 5-325 MG tablet Take 1 tablet by mouth every 8 (eight) hours as needed for moderate pain. 60 tablet 0  . loratadine (CLARITIN) 10 MG tablet Take 1 tablet (10 mg total) by mouth daily. Take daily x 4 days (start 2 days prior to chemo) 30 tablet 3  . montelukast (SINGULAIR) 10 MG tablet Take 1 tablet (10 mg total) by mouth at bedtime. Start 2 days prior to chemo. Take for 4 days total 30 tablet 3  . pantoprazole (PROTONIX) 40 MG tablet Take 1 tablet (40 mg total) by mouth daily.    . polyethylene glycol (MIRALAX / GLYCOLAX) packet Take 17 g by mouth daily as needed for mild constipation. 14 each 0  . potassium chloride SA (K-DUR,KLOR-CON) 20 MEQ tablet Take 1 tablet (20 mEq total) by mouth 2 (two) times daily. 60 tablet 3  . ranitidine (ZANTAC) 150 MG tablet Take 1 tablet (150 mg total) by mouth 2 (two) times daily. START TAKING 2 DAYS PRIOR TO CHEMOTHERAPY. TAKE FOR A TOTAL OF 4 DAYS    . REVLIMID 20 MG capsule TAKE 1 CAPSULE BY MOUTH EVERY DAY FOR 14 DAYS 14 capsule 0  . temazepam (RESTORIL) 7.5 MG capsule Take 1 capsule (7.5 mg total) by mouth at bedtime. 30 capsule 0   No current facility-administered medications for this visit.       .  PHYSICAL EXAMINATION: ECOG PERFORMANCE STATUS: 1 - Symptomatic but completely ambulatory  Vitals:   09/22/17 0956  BP: 119/78  Pulse: 84  Resp: 16  Temp: 97.9 F (36.6 C)   Filed Weights   09/22/17 0954 09/22/17 0956  Weight: 147 lb 9.6 oz (67 kg) 147 lb 9.6 oz (67 kg)    GENERAL: Well-nourished well-developed; Alert, no distress and comfortable. She is a alone. EYES: no pallor or icterus OROPHARYNX: no thrush or ulceration; good dentition  NECK: supple, no masses felt LYMPH:  no palpable  lymphadenopathy in the cervical, axillary or inguinal regions LUNGS: clear to auscultation and  No wheeze or crackles HEART/CVS: regular rate & rhythm and no murmurs; No lower extremity edema ABDOMEN: abdomen soft, non-tender and normal bowel sounds Musculoskeletal:no cyanosis of digits and no clubbing  PSYCH: alert & oriented x 3 with fluent speech NEURO: no focal motor/sensory deficits SKIN: Faint rash on her torso/   LABORATORY DATA:  I have reviewed the data as listed Lab Results  Component Value Date   WBC 6.4 10/06/2017   HGB 10.7 (L) 10/06/2017   HCT 31.0 (L) 10/06/2017   MCV 97.1 10/06/2017   PLT 311 10/06/2017   Recent Labs    09/16/17 0828 09/22/17 0915 09/30/17 0808 10/06/17   0837  NA 138 137 139 138  K 3.0* 3.4* 3.7 3.9  CL 102 104 109 102  CO2 26 23 23 26  GLUCOSE 108* 85 101* 99  BUN 11 13 14 17  CREATININE 0.72 0.77 0.62 0.75  CALCIUM 8.6* 8.5* 8.7* 9.3  GFRNONAA >60 >60 >60 >60  GFRAA >60 >60 >60 >60  PROT 7.7 8.1 8.1  --   ALBUMIN 3.2* 3.3* 3.3*  --   AST 33 25 23  --   ALT 27 31 25  --   ALKPHOS 67 69 60  --   BILITOT 0.3 0.4 0.2*  --     RADIOGRAPHIC STUDIES: I have personally reviewed the radiological images as listed and agreed with the findings in the report. No results found. IMPRESSION: 1. There is an FDG avid lytic lesion in the right posterior element of L3 consistent with myelomatous involvement. More mild uptake without CT correlate in L5 is nonspecific. Recommend attention on follow-up. No other bony involvement identified. 2. Atherosclerotic changes seen in the aorta. Coronary artery calcifications. 3. Nodular uptake in the skin and subcutaneous tissues of the right pelvis, likely an injection site. Recommend clinical correlation to exclude a skin lesion. Aortic Atherosclerosis (ICD10-I70.0).   Electronically Signed   By: David  Williams III M.D   On: 04/21/2017 15:09  --------------------------  Results for Muehl,  Malanie E (MRN [id number]) as of 09/22/2017 10:34  Ref. Range 04/05/2017 22:43 04/06/2017 14:12 04/10/2017 11:17 04/29/2017 10:10 04/29/2017 10:16 05/26/2017 08:30 06/23/2017 09:28 07/22/2017 11:40 09/01/2017 08:58 09/01/2017 09:06  M Protein SerPl Elph-Mcnc Latest Ref Range: Not Observed g/dL    3.2 (H)  2.9 (H) 2.2 (H) 2.4 (H) 2.4 (H)     Results for Sottile, Carmelia E (MRN [id number]) as of 09/22/2017 10:34  Ref. Range 04/05/2017 22:43 04/06/2017 14:12 04/10/2017 11:17 04/29/2017 10:10 04/29/2017 10:16 05/26/2017 08:30 06/23/2017 09:28 07/22/2017 11:40 09/01/2017 08:58 09/01/2017 09:06  Kappa free light chain Latest Ref Range: 3.3 - 19.4 mg/L  88.6 (H)   56.9 (H) 38.6 (H) 33.7 (H) 23.0 (H)  29.2 (H)  Lamda free light chains Latest Ref Range: 5.7 - 26.3 mg/L  1,298.8 (H)   828.9 (H) 944.2 (H) 739.8 (H) 556.6 (H)  391.3 (H)        ASSESSMENT & PLAN:   Multiple myeloma in relapse (HCC) # Multiple myeloma-stage III. Transplant eligible- IgA lambda [sep 2018]; cytogenetics/fish- 11:14-STANDARD/? High RISK]; pretreatment-M protein 3.7; lambda light chain 1298.   # Currently on second line therapy with Rev-dara-dex [jan 11th]-given partial response to RVD; interruption sec to infections [bronchiis/pneumonia].  February 2019-M protein 2.4 g; lambda light chains 391. [Patient of note had multiple interruptions of her Revlimid therapy also]  # Off dev today; plan to re-start in 1 week; continue dex 40 mg weekly; dara # 7 today.   #Right hip pain-MRI shows lesion-significant improvement in pain on daratumumab.   # Skeletal lesion- on  X [place]- proceed today ca- 8.6 recommend ca+vit D  # Infectious prophylaxis- continue acyclovir/ DVT prohylaxis- on asprin 325 mg/day.   # follow up in 2 weeks/labs/dara/MD; 1 week/labs-MM/dara  Addendum: Discussed with Dr. Gesperatto.     All questions were answered. The patient knows to call the clinic with any problems, questions or concerns.    [name] [name],  MD 10/11/2017 1:46 PM 

## 2017-09-22 NOTE — Assessment & Plan Note (Addendum)
#  Multiple myeloma-stage III. Transplant eligible- IgA lambda [sep 3709]; cytogenetics/fish- 11:14-STANDARD/? High RISK]; pretreatment-M protein 3.7; lambda light chain 1298.   # Currently on second line therapy with Rev-dara-dex [jan 11th]-given partial response to RVD; interruption sec to infections [bronchiis/pneumonia].  February 2019-M protein 2.4 g; lambda light chains 391. [Patient of note had multiple interruptions of her Revlimid therapy also]  # Off dev today; plan to re-start in 1 week; continue dex 40 mg weekly; dara # 7 today.   #Right hip pain-MRI shows lesion-significant improvement in pain on daratumumab.   # Skeletal lesion- on  X [place]- proceed today ca- 8.6 recommend ca+vit D  # Infectious prophylaxis- continue acyclovir/ DVT prohylaxis- on asprin 325 mg/day.   # follow up in 2 weeks/labs/dara/MD; 1 week/labs-MM/dara  Addendum: Discussed with Dr. Forrest Moron.

## 2017-09-23 ENCOUNTER — Inpatient Hospital Stay: Payer: Medicare Other | Attending: Internal Medicine

## 2017-09-23 VITALS — BP 131/79 | HR 91 | Temp 99.2°F | Resp 18 | Wt 146.2 lb

## 2017-09-23 DIAGNOSIS — I10 Essential (primary) hypertension: Secondary | ICD-10-CM | POA: Insufficient documentation

## 2017-09-23 DIAGNOSIS — M899 Disorder of bone, unspecified: Secondary | ICD-10-CM | POA: Diagnosis not present

## 2017-09-23 DIAGNOSIS — M25551 Pain in right hip: Secondary | ICD-10-CM | POA: Insufficient documentation

## 2017-09-23 DIAGNOSIS — M25559 Pain in unspecified hip: Secondary | ICD-10-CM | POA: Diagnosis not present

## 2017-09-23 DIAGNOSIS — C9002 Multiple myeloma in relapse: Secondary | ICD-10-CM

## 2017-09-23 DIAGNOSIS — Z5112 Encounter for antineoplastic immunotherapy: Secondary | ICD-10-CM | POA: Insufficient documentation

## 2017-09-23 MED ORDER — DIPHENHYDRAMINE HCL 25 MG PO CAPS
50.0000 mg | ORAL_CAPSULE | Freq: Once | ORAL | Status: AC
  Administered 2017-09-23: 50 mg via ORAL
  Filled 2017-09-23: qty 2

## 2017-09-23 MED ORDER — PROCHLORPERAZINE MALEATE 10 MG PO TABS
10.0000 mg | ORAL_TABLET | Freq: Once | ORAL | Status: AC
  Administered 2017-09-23: 10 mg via ORAL
  Filled 2017-09-23: qty 1

## 2017-09-23 MED ORDER — METHYLPREDNISOLONE SODIUM SUCC 125 MG IJ SOLR
125.0000 mg | Freq: Once | INTRAMUSCULAR | Status: AC
  Administered 2017-09-23: 125 mg via INTRAVENOUS
  Filled 2017-09-23: qty 2

## 2017-09-23 MED ORDER — SODIUM CHLORIDE 0.9 % IV SOLN
Freq: Once | INTRAVENOUS | Status: AC
  Administered 2017-09-23: 10:00:00 via INTRAVENOUS
  Filled 2017-09-23: qty 1000

## 2017-09-23 MED ORDER — ACETAMINOPHEN 325 MG PO TABS
650.0000 mg | ORAL_TABLET | Freq: Once | ORAL | Status: AC
  Administered 2017-09-23: 650 mg via ORAL
  Filled 2017-09-23: qty 2

## 2017-09-23 MED ORDER — SODIUM CHLORIDE 0.9 % IV SOLN
1000.0000 mg | Freq: Once | INTRAVENOUS | Status: AC
  Administered 2017-09-23: 1000 mg via INTRAVENOUS
  Filled 2017-09-23: qty 40

## 2017-09-24 ENCOUNTER — Other Ambulatory Visit: Payer: Self-pay | Admitting: Internal Medicine

## 2017-09-24 DIAGNOSIS — C9002 Multiple myeloma in relapse: Secondary | ICD-10-CM

## 2017-09-26 NOTE — Telephone Encounter (Signed)
MD needs to discuss further with Dr. Alvie Heidelberg before RF is initiated as pt is planning bone marrow transplant.

## 2017-09-30 ENCOUNTER — Inpatient Hospital Stay: Payer: Medicare Other

## 2017-09-30 ENCOUNTER — Other Ambulatory Visit: Payer: Self-pay | Admitting: *Deleted

## 2017-09-30 VITALS — BP 107/69 | HR 74 | Resp 20

## 2017-09-30 DIAGNOSIS — C9002 Multiple myeloma in relapse: Secondary | ICD-10-CM

## 2017-09-30 DIAGNOSIS — M899 Disorder of bone, unspecified: Secondary | ICD-10-CM | POA: Diagnosis not present

## 2017-09-30 DIAGNOSIS — I10 Essential (primary) hypertension: Secondary | ICD-10-CM | POA: Diagnosis not present

## 2017-09-30 DIAGNOSIS — M25551 Pain in right hip: Secondary | ICD-10-CM | POA: Diagnosis not present

## 2017-09-30 DIAGNOSIS — Z5112 Encounter for antineoplastic immunotherapy: Secondary | ICD-10-CM | POA: Diagnosis not present

## 2017-09-30 DIAGNOSIS — M25559 Pain in unspecified hip: Secondary | ICD-10-CM | POA: Diagnosis not present

## 2017-09-30 LAB — CBC WITH DIFFERENTIAL/PLATELET
BASOS ABS: 0 10*3/uL (ref 0–0.1)
BASOS PCT: 0 %
EOS PCT: 3 %
Eosinophils Absolute: 0.2 10*3/uL (ref 0–0.7)
HCT: 30 % — ABNORMAL LOW (ref 35.0–47.0)
Hemoglobin: 10 g/dL — ABNORMAL LOW (ref 12.0–16.0)
LYMPHS PCT: 41 %
Lymphs Abs: 3.3 10*3/uL (ref 1.0–3.6)
MCH: 32.8 pg (ref 26.0–34.0)
MCHC: 33.4 g/dL (ref 32.0–36.0)
MCV: 98.2 fL (ref 80.0–100.0)
MONO ABS: 0.9 10*3/uL (ref 0.2–0.9)
Monocytes Relative: 11 %
NEUTROS ABS: 3.6 10*3/uL (ref 1.4–6.5)
Neutrophils Relative %: 45 %
PLATELETS: 285 10*3/uL (ref 150–440)
RBC: 3.06 MIL/uL — AB (ref 3.80–5.20)
RDW: 18.5 % — AB (ref 11.5–14.5)
WBC: 8.1 10*3/uL (ref 3.6–11.0)

## 2017-09-30 LAB — COMPREHENSIVE METABOLIC PANEL
ALBUMIN: 3.3 g/dL — AB (ref 3.5–5.0)
ALT: 25 U/L (ref 14–54)
AST: 23 U/L (ref 15–41)
Alkaline Phosphatase: 60 U/L (ref 38–126)
Anion gap: 7 (ref 5–15)
BUN: 14 mg/dL (ref 6–20)
CHLORIDE: 109 mmol/L (ref 101–111)
CO2: 23 mmol/L (ref 22–32)
CREATININE: 0.62 mg/dL (ref 0.44–1.00)
Calcium: 8.7 mg/dL — ABNORMAL LOW (ref 8.9–10.3)
GFR calc Af Amer: >60 mL/min (ref >60)
GFR calc non Af Amer: >60 mL/min (ref >60)
GLUCOSE: 101 mg/dL — AB (ref 65–99)
POTASSIUM: 3.7 mmol/L (ref 3.5–5.1)
Sodium: 139 mmol/L (ref 135–145)
Total Bilirubin: 0.2 mg/dL — ABNORMAL LOW (ref 0.3–1.2)
Total Protein: 8.1 g/dL (ref 6.5–8.1)

## 2017-09-30 MED ORDER — PROCHLORPERAZINE MALEATE 10 MG PO TABS
10.0000 mg | ORAL_TABLET | Freq: Once | ORAL | Status: AC
  Administered 2017-09-30: 10 mg via ORAL
  Filled 2017-09-30: qty 1

## 2017-09-30 MED ORDER — SODIUM CHLORIDE 0.9 % IV SOLN
1000.0000 mg | Freq: Once | INTRAVENOUS | Status: AC
  Administered 2017-09-30: 1000 mg via INTRAVENOUS
  Filled 2017-09-30: qty 40

## 2017-09-30 MED ORDER — SODIUM CHLORIDE 0.9 % IV SOLN: 16.0000 mg/kg | Freq: Once | INTRAVENOUS | Status: DC

## 2017-09-30 MED ORDER — ACETAMINOPHEN 325 MG PO TABS
650.0000 mg | ORAL_TABLET | Freq: Once | ORAL | Status: AC
  Administered 2017-09-30: 650 mg via ORAL
  Filled 2017-09-30: qty 2

## 2017-09-30 MED ORDER — METHYLPREDNISOLONE SODIUM SUCC 125 MG IJ SOLR
125.0000 mg | Freq: Once | INTRAMUSCULAR | Status: AC
  Administered 2017-09-30: 125 mg via INTRAVENOUS
  Filled 2017-09-30: qty 2

## 2017-09-30 MED ORDER — SODIUM CHLORIDE 0.9 % IV SOLN
Freq: Once | INTRAVENOUS | Status: AC
  Administered 2017-09-30: 09:00:00 via INTRAVENOUS
  Filled 2017-09-30: qty 1000

## 2017-09-30 MED ORDER — DIPHENHYDRAMINE HCL 25 MG PO CAPS
50.0000 mg | ORAL_CAPSULE | Freq: Once | ORAL | Status: AC
  Administered 2017-09-30: 50 mg via ORAL
  Filled 2017-09-30: qty 2

## 2017-10-03 LAB — KAPPA/LAMBDA LIGHT CHAINS
KAPPA, LAMDA LIGHT CHAIN RATIO: 0.06 — AB (ref 0.26–1.65)
Kappa free light chain: 24.6 mg/L — ABNORMAL HIGH (ref 3.3–19.4)
LAMDA FREE LIGHT CHAINS: 391.3 mg/L — AB (ref 5.7–26.3)

## 2017-10-04 LAB — MULTIPLE MYELOMA PANEL, SERUM
ALBUMIN/GLOB SERPL: 0.9 (ref 0.7–1.7)
ALPHA 1: 0.1 g/dL (ref 0.0–0.4)
ALPHA2 GLOB SERPL ELPH-MCNC: 0.8 g/dL (ref 0.4–1.0)
Albumin SerPl Elph-Mcnc: 3.4 g/dL (ref 2.9–4.4)
B-Globulin SerPl Elph-Mcnc: 2.9 g/dL — ABNORMAL HIGH (ref 0.7–1.3)
GAMMA GLOB SERPL ELPH-MCNC: 0.4 g/dL (ref 0.4–1.8)
GLOBULIN, TOTAL: 4.2 g/dL — AB (ref 2.2–3.9)
IGG (IMMUNOGLOBIN G), SERUM: 643 mg/dL — AB (ref 700–1600)
IgA: 1576 mg/dL — ABNORMAL HIGH (ref 87–352)
IgM (Immunoglobulin M), Srm: 10 mg/dL — ABNORMAL LOW (ref 26–217)
M Protein SerPl Elph-Mcnc: 1 g/dL — ABNORMAL HIGH
Total Protein ELP: 7.6 g/dL (ref 6.0–8.5)

## 2017-10-06 ENCOUNTER — Inpatient Hospital Stay: Payer: Medicare Other

## 2017-10-06 ENCOUNTER — Encounter: Payer: Self-pay | Admitting: Internal Medicine

## 2017-10-06 ENCOUNTER — Inpatient Hospital Stay (HOSPITAL_BASED_OUTPATIENT_CLINIC_OR_DEPARTMENT_OTHER): Payer: Medicare Other | Admitting: Internal Medicine

## 2017-10-06 VITALS — BP 135/82 | HR 82 | Temp 97.9°F | Resp 16 | Wt 149.0 lb

## 2017-10-06 DIAGNOSIS — C9002 Multiple myeloma in relapse: Secondary | ICD-10-CM | POA: Diagnosis not present

## 2017-10-06 DIAGNOSIS — M899 Disorder of bone, unspecified: Secondary | ICD-10-CM | POA: Diagnosis not present

## 2017-10-06 DIAGNOSIS — M25551 Pain in right hip: Secondary | ICD-10-CM | POA: Diagnosis not present

## 2017-10-06 DIAGNOSIS — M25559 Pain in unspecified hip: Secondary | ICD-10-CM

## 2017-10-06 DIAGNOSIS — I10 Essential (primary) hypertension: Secondary | ICD-10-CM | POA: Diagnosis not present

## 2017-10-06 DIAGNOSIS — Z5112 Encounter for antineoplastic immunotherapy: Secondary | ICD-10-CM | POA: Diagnosis not present

## 2017-10-06 LAB — BASIC METABOLIC PANEL
Anion gap: 10 (ref 5–15)
BUN: 17 mg/dL (ref 6–20)
CALCIUM: 9.3 mg/dL (ref 8.9–10.3)
CHLORIDE: 102 mmol/L (ref 101–111)
CO2: 26 mmol/L (ref 22–32)
CREATININE: 0.75 mg/dL (ref 0.44–1.00)
GFR calc non Af Amer: >60 mL/min (ref >60)
Glucose, Bld: 99 mg/dL (ref 65–99)
Potassium: 3.9 mmol/L (ref 3.5–5.1)
SODIUM: 138 mmol/L (ref 135–145)

## 2017-10-06 LAB — CBC WITH DIFFERENTIAL/PLATELET
BASOS PCT: 1 %
Basophils Absolute: 0 10*3/uL (ref 0–0.1)
EOS ABS: 0.4 10*3/uL (ref 0–0.7)
EOS PCT: 7 %
HCT: 31 % — ABNORMAL LOW (ref 35.0–47.0)
Hemoglobin: 10.7 g/dL — ABNORMAL LOW (ref 12.0–16.0)
LYMPHS ABS: 2.5 10*3/uL (ref 1.0–3.6)
Lymphocytes Relative: 40 %
MCH: 33.4 pg (ref 26.0–34.0)
MCHC: 34.4 g/dL (ref 32.0–36.0)
MCV: 97.1 fL (ref 80.0–100.0)
Monocytes Absolute: 0.2 10*3/uL (ref 0.2–0.9)
Monocytes Relative: 2 %
NEUTROS PCT: 50 %
Neutro Abs: 3.3 10*3/uL (ref 1.4–6.5)
PLATELETS: 311 10*3/uL (ref 150–440)
RBC: 3.19 MIL/uL — AB (ref 3.80–5.20)
RDW: 17.9 % — ABNORMAL HIGH (ref 11.5–14.5)
WBC: 6.4 10*3/uL (ref 3.6–11.0)

## 2017-10-06 NOTE — Progress Notes (Signed)
[place] NOTE  Patient Care Team: Center, [facility] as PCP - General (General Practice)  CHIEF COMPLAINTS/PURPOSE OF CONSULTATION: Multiple myeloma Multiple myeloma.  #  Oncology History   # SEP 2018- MULTIPLE MYELOMA IgALamda [2.5 gm/dl; K/L= 88/1298]; STAGE III [beta 2 microglobulin=5.5] [presented with acute renal failure; anemia; NO hypercalcemia; Skeletal survey-Normal]; BMBx- 45% plasma cells; FISH-POSITIVE 11:14 translocation.[STANDARD-high RISK]/cyto-Normal; SEP 2018- PET- L3 posterior element lesion.   # 9/14- velcade SQ twice weekly/Dex 40 mg/week; OCT 5th 2018-Start R [92m]VD; 3cycles of RVD- PARTAL RESPONSE  # Jan 11th 2019-Dara-Rev-Dex   # Acute renal failure [Dr.Singh; Proteinuria 1.5gm/day ]; acyclovir/Asprin     Multiple myeloma in relapse (HCC)     HISTORY OF PRESENTING ILLNESS:  Brandi HESCH89y.o.  female multiple myeloma stage III transplant eligible currently on Dara-rev-Dex is here for follow-up.  Patient states that she has been feeling the best.  Left hip pain is improved.  No nausea no vomiting.  Patient continues to be compliant with her Revlimid/dexamethasone.  ROS: A complete 10 point review of system is done which is negative except mentioned above in history of present illness  MEDICAL HISTORY:  Past Medical History:  Diagnosis Date  . Hypertension   . [place]     SURGICAL HISTORY: History reviewed. No pertinent surgical history.  SOCIAL HISTORY:  [place] Her daughter lives close by. She smokes about 4-5 cigarettes a day. No alcohol. She used to work in a [employer] Currently retired.  Social History   Socioeconomic History  . Marital status: Single    Spouse name: Not on file  . Number of children: Not on file  . Years of education: Not on file  . Highest education level: Not on file  Social Needs  . Financial resource strain: Not on file  . Food  insecurity - worry: Not on file  . Food insecurity - inability: Not on file  . Transportation needs - medical: Not on file  . Transportation needs - non-medical: Not on file  Occupational History  . Not on file  Tobacco Use  . Smoking status: Current Every Day Smoker    Packs/day: 0.50    Types: Cigarettes  . Smokeless tobacco: Never Used  Substance and Sexual Activity  . Alcohol use: No  . Drug use: No  . Sexual activity: Not on file  Other Topics Concern  . Not on file  Social History Narrative   Lives at home with family. Independent at baseline    FAMILY HISTORY: Family History  Problem Relation Age of Onset  . Pneumonia Mother   . Seizures Father     ALLERGIES:  has No Known Allergies.  MEDICATIONS:  Current Outpatient Medications  Medication Sig Dispense Refill  . polyethylene glycol (MIRALAX / GLYCOLAX) packet Take 17 g by mouth daily as needed for mild constipation. 14 each 0  . acetaminophen (TYLENOL) 500 MG tablet Take 1 tablet (500 mg total) by mouth every 6 (six) hours as needed (TAKE 1 TABLET DAILY X 4 DAYS-STARTING 2 DAYS PRIOR TO CHEMOTHERAPY.). (Patient taking differently: Take 1,000 mg by mouth every 6 (six) hours as needed (TAKE 1 TABLET DAILY X 4 DAYS-STARTING 2 DAYS PRIOR TO CHEMOTHERAPY.). ) 30 tablet 0  . acyclovir (ZOVIRAX) 400 MG tablet One pill a day [to prevent shingles] 30 tablet 6  . amLODipine (NORVASC) 10 MG tablet Take 1 tablet (10 mg total) by mouth daily.  30 tablet 4  . cyanocobalamin 100 MCG tablet Take 1 tablet (100 mcg total) by mouth daily. 30 tablet 0  . dexamethasone (DECADRON) 4 MG tablet Take 10 tablets (40 mg total) by mouth once a week. 10 pills in AM with food. (Patient taking differently: Take 4 mg by mouth 2 (two) times daily. Take with food - (TAKE 1 TABLET IN THE AM AND 1 TABLET IN THE PM-)  start on 2 days prior to chemotherapy infusion and take for a total of 4 days).) 40 tablet 4  . dronabinol (MARINOL) 5 MG capsule Take 1  capsule (5 mg total) by mouth 2 (two) times daily before lunch and supper. 60 capsule 3  . feeding supplement, ENSURE ENLIVE, (ENSURE ENLIVE) LIQD Take 237 mLs by mouth 2 (two) times daily between meals. 60 Bottle 0  . HYDROcodone-acetaminophen (NORCO) 5-325 MG tablet Take 1 tablet by mouth every 8 (eight) hours as needed for moderate pain. 60 tablet 0  . loratadine (CLARITIN) 10 MG tablet Take 1 tablet (10 mg total) by mouth daily. Take daily x 4 days (start 2 days prior to chemo) 30 tablet 3  . montelukast (SINGULAIR) 10 MG tablet Take 1 tablet (10 mg total) by mouth at bedtime. Start 2 days prior to chemo. Take for 4 days total 30 tablet 3  . pantoprazole (PROTONIX) 40 MG tablet Take 1 tablet (40 mg total) by mouth daily.    . potassium chloride SA (K-DUR,KLOR-CON) 20 MEQ tablet Take 1 tablet (20 mEq total) by mouth 2 (two) times daily. 60 tablet 3  . ranitidine (ZANTAC) 150 MG tablet Take 1 tablet (150 mg total) by mouth 2 (two) times daily. START TAKING 2 DAYS PRIOR TO CHEMOTHERAPY. TAKE FOR A TOTAL OF 4 DAYS    . REVLIMID 20 MG capsule TAKE 1 CAPSULE BY MOUTH EVERY DAY FOR 14 DAYS 14 capsule 0  . temazepam (RESTORIL) 7.5 MG capsule Take 1 capsule (7.5 mg total) by mouth at bedtime. 30 capsule 0   No current facility-administered medications for this visit.       [name]  PHYSICAL EXAMINATION: ECOG PERFORMANCE STATUS: 1 - Symptomatic but completely ambulatory  Vitals:   10/06/17 0903  BP: 135/82  Pulse: 82  Resp: 16  Temp: 97.9 F (36.6 C)   Filed Weights   10/06/17 0903  Weight: 149 lb (67.6 kg)    GENERAL: Well-nourished well-developed; Alert, no distress and comfortable. She is a alone. EYES: no pallor or icterus OROPHARYNX: no thrush or ulceration; good dentition  NECK: supple, no masses felt LYMPH:  no palpable lymphadenopathy in the cervical, axillary or inguinal regions LUNGS: clear to auscultation and  No wheeze or crackles HEART/CVS: regular rate & rhythm and no  murmurs; No lower extremity edema ABDOMEN: abdomen soft, non-tender and normal bowel sounds Musculoskeletal:no cyanosis of digits and no clubbing  PSYCH: alert & oriented x 3 with fluent speech NEURO: no focal motor/sensory deficits SKIN: Faint rash on her torso/   LABORATORY DATA:  I have reviewed the data as listed Lab Results  Component Value Date   WBC 6.4 10/06/2017   HGB 10.7 (L) 10/06/2017   HCT 31.0 (L) 10/06/2017   MCV 97.1 10/06/2017   PLT 311 10/06/2017   Recent Labs    09/16/17 0828 09/22/17 0915 09/30/17 0808 10/06/17 0837  NA 138 137 139 138  K 3.0* 3.4* 3.7 3.9  CL 102 104 109 102  CO2 _0 GLUCOSE 108* 85 101*  99  BUN _0 CREATININE 0.72 0.77 0.62 0.75  CALCIUM 8.6* 8.5* 8.7* 9.3  GFRNONAA >60 >60 >60 >60  GFRAA >60 >60 >60 >60  PROT 7.7 8.1 8.1  --   ALBUMIN 3.2* 3.3* 3.3*  --   AST 33 25 23  --   ALT _1 --   ALKPHOS 67 69 60  --   BILITOT 0.3 0.4 0.2*  --     RADIOGRAPHIC STUDIES: I have personally reviewed the radiological images as listed and agreed with the findings in the report. No results found. IMPRESSION: 1. There is an FDG avid lytic lesion in the right posterior element of L3 consistent with myelomatous involvement. More mild uptake without CT correlate in L5 is nonspecific. Recommend attention on follow-up. No other bony involvement identified. 2. Atherosclerotic changes seen in the aorta. Coronary artery calcifications. 3. Nodular uptake in the skin and subcutaneous tissues of the right pelvis, likely an injection site. Recommend clinical correlation to exclude a skin lesion. Aortic Atherosclerosis (ICD10-I70.0).   Electronically Signed   By: Dorise Bullion III M.D   On: 04/21/2017 15:09  --------------------------  Results for Brandi [name], Brandi [name] (MRN [id number]) as of 10/06/2017 09:36  Ref. Range 04/05/2017 22:43 04/06/2017 14:12 04/10/2017 11:17 04/29/2017 10:10 04/29/2017 10:16 05/26/2017 08:30  06/23/2017 09:28 07/22/2017 11:40 09/01/2017 08:58 09/01/2017 09:06 09/30/2017 08:01 09/30/2017 08:08  M Protein SerPl Elph-Mcnc Latest Ref Range: Not Observed g/dL    3.2 (H)  2.9 (H) 2.2 (H) 2.4 (H) 2.4 (H)  1.0 (H)      Results for Brandi [name], Brandi [name] (MRN [id number]) as of 10/06/2017 09:36  Ref. Range 04/05/2017 22:43 04/06/2017 14:12 04/10/2017 11:17 04/29/2017 10:10 04/29/2017 10:16 05/26/2017 08:30 06/23/2017 09:28 07/22/2017 11:40 09/01/2017 08:58 09/01/2017 09:06 09/30/2017 08:01 09/30/2017 08:08  Kappa free light chain Latest Ref Range: 3.3 - 19.4 mg/L  88.6 (H)   56.9 (H) 38.6 (H) 33.7 (H) 23.0 (H)  29.2 (H)  24.6 (H)  Lamda free light chains Latest Ref Range: 5.7 - 26.3 mg/L  1,298.8 (H)   828.9 (H) 944.2 (H) 739.8 (H) 556.6 (H)  391.3 (H)  391.3 (H)  Kappa, lamda light chain ratio Latest Ref Range: 0.26 - 1.65   0.07 (L)   0.07 (L) 0.04 (L) 0.05 (L) 0.04 (L)  0.07 (L)  0.06 (L)        ASSESSMENT & PLAN:   Multiple myeloma in relapse (HCC) # Multiple myeloma-stage III. Transplant eligible- IgA lambda [sep 4888]; cytogenetics/fish- 11:14-STANDARD/? High RISK]; pretreatment-M protein 3.7; lambda light chain 1298.   # Currently on second line therapy with Rev-dara-dex [jan 11th]-given partial response to RVD; interruption sec to infections [bronchiis/pneumonia].  March 2019-M protein 1.0 g; lambda light chains 391. [Patient of note had multiple interruptions of her Revlimid therapy also]  # On Rev 20 mg 2 w-ON & 1 w-OFF; [1 more week to go] continue dex 40 mg weekly; dara # 8 today.   #Right hip pain-MRI shows lesion-significant improvement in pain on daratumumab.   # Skeletal lesion- on  X [place]eva- proceed today ca- 8.6 recommend ca+vit D; X-geva 3/15.    # Infectious prophylaxis- continue acyclovir/ DVT prohylaxis- on asprin 325 mg/day.   # [facility] on 20th- ? Left message for Dr.Gesperrato.   # follow up in 2 week/ labs- possible dara;wil HOLD Rev after this cycle.   Addendum: Discussed with  Dr.Gesperatto; who agrees with the plan/will review the bone marrow transplant/harvest timetable; we  will plan to hold Revlimid 2 weeks prior.  All questions were answered. The patient knows to call the clinic with any problems, questions or concerns.    [name], MD 10/09/2017 7:38 PM

## 2017-10-06 NOTE — Assessment & Plan Note (Addendum)
#  Multiple myeloma-stage III. Transplant eligible- IgA lambda [sep 5697]; cytogenetics/fish- 11:14-STANDARD/? High RISK]; pretreatment-M protein 3.7; lambda light chain 1298.   # Currently on second line therapy with Rev-dara-dex [jan 11th]-given partial response to RVD; interruption sec to infections [bronchiis/pneumonia].  March 2019-M protein 1.0 g; lambda light chains 391. [Patient of note had multiple interruptions of her Revlimid therapy also]  # On Rev 20 mg 2 w-ON & 1 w-OFF; [1 more week to go] continue dex 40 mg weekly; dara # 8 today.   #Right hip pain-MRI shows lesion-significant improvement in pain on daratumumab.   # Skeletal lesion- on  X [place]- proceed today ca- 8.6 recommend ca+vit D; X-geva 3/15.    # Infectious prophylaxis- continue acyclovir/ DVT prohylaxis- on asprin 325 mg/day.   # [facility] on 20th- ? Left message for Dr.Gesperrato.   # follow up in 2 week/ labs- possible dara;wil HOLD Rev after this cycle.   Addendum: Discussed with Dr.Gesperatto; who agrees with the plan/will review the bone marrow transplant/harvest timetable; we will plan to hold Revlimid 2 weeks prior.

## 2017-10-07 ENCOUNTER — Inpatient Hospital Stay: Payer: Medicare Other

## 2017-10-07 VITALS — BP 108/71 | HR 85 | Temp 99.1°F | Resp 20 | Wt 149.0 lb

## 2017-10-07 DIAGNOSIS — M25559 Pain in unspecified hip: Secondary | ICD-10-CM | POA: Diagnosis not present

## 2017-10-07 DIAGNOSIS — M899 Disorder of bone, unspecified: Secondary | ICD-10-CM | POA: Diagnosis not present

## 2017-10-07 DIAGNOSIS — C9002 Multiple myeloma in relapse: Secondary | ICD-10-CM

## 2017-10-07 DIAGNOSIS — M25551 Pain in right hip: Secondary | ICD-10-CM | POA: Diagnosis not present

## 2017-10-07 DIAGNOSIS — Z5112 Encounter for antineoplastic immunotherapy: Secondary | ICD-10-CM | POA: Diagnosis not present

## 2017-10-07 DIAGNOSIS — I10 Essential (primary) hypertension: Secondary | ICD-10-CM | POA: Diagnosis not present

## 2017-10-07 MED ORDER — PROCHLORPERAZINE MALEATE 10 MG PO TABS
10.0000 mg | ORAL_TABLET | Freq: Once | ORAL | Status: AC
  Administered 2017-10-07: 10 mg via ORAL
  Filled 2017-10-07: qty 1

## 2017-10-07 MED ORDER — DENOSUMAB 120 MG/1.7ML ~~LOC~~ SOLN
120.0000 mg | Freq: Once | SUBCUTANEOUS | Status: AC
  Administered 2017-10-07: 120 mg via SUBCUTANEOUS
  Filled 2017-10-07: qty 1.7

## 2017-10-07 MED ORDER — SODIUM CHLORIDE 0.9 % IV SOLN
Freq: Once | INTRAVENOUS | Status: AC
  Administered 2017-10-07: 09:00:00 via INTRAVENOUS
  Filled 2017-10-07: qty 1000

## 2017-10-07 MED ORDER — DIPHENHYDRAMINE HCL 25 MG PO CAPS
50.0000 mg | ORAL_CAPSULE | Freq: Once | ORAL | Status: AC
  Administered 2017-10-07: 50 mg via ORAL
  Filled 2017-10-07: qty 2

## 2017-10-07 MED ORDER — METHYLPREDNISOLONE SODIUM SUCC 125 MG IJ SOLR
125.0000 mg | Freq: Once | INTRAMUSCULAR | Status: AC
  Administered 2017-10-07: 125 mg via INTRAVENOUS
  Filled 2017-10-07: qty 2

## 2017-10-07 MED ORDER — DARATUMUMAB CHEMO INJECTION 400 MG/20ML
1000.0000 mg | Freq: Once | INTRAVENOUS | Status: AC
  Administered 2017-10-07: 1000 mg via INTRAVENOUS
  Filled 2017-10-07: qty 40

## 2017-10-07 MED ORDER — ACETAMINOPHEN 325 MG PO TABS
650.0000 mg | ORAL_TABLET | Freq: Once | ORAL | Status: AC
  Administered 2017-10-07: 650 mg via ORAL
  Filled 2017-10-07: qty 2

## 2017-10-18 ENCOUNTER — Telehealth: Payer: Self-pay | Admitting: Internal Medicine

## 2017-10-18 NOTE — Telephone Encounter (Signed)
Returned a call ; Left message for [facility] ? NP as given.   FYI

## 2017-10-19 ENCOUNTER — Inpatient Hospital Stay: Payer: Medicare Other

## 2017-10-19 ENCOUNTER — Inpatient Hospital Stay (HOSPITAL_BASED_OUTPATIENT_CLINIC_OR_DEPARTMENT_OTHER): Payer: Medicare Other | Admitting: Internal Medicine

## 2017-10-19 ENCOUNTER — Other Ambulatory Visit: Payer: Self-pay

## 2017-10-19 ENCOUNTER — Encounter: Payer: Self-pay | Admitting: Internal Medicine

## 2017-10-19 DIAGNOSIS — M25559 Pain in unspecified hip: Secondary | ICD-10-CM | POA: Diagnosis not present

## 2017-10-19 DIAGNOSIS — I10 Essential (primary) hypertension: Secondary | ICD-10-CM | POA: Diagnosis not present

## 2017-10-19 DIAGNOSIS — C9002 Multiple myeloma in relapse: Secondary | ICD-10-CM | POA: Diagnosis not present

## 2017-10-19 DIAGNOSIS — M899 Disorder of bone, unspecified: Secondary | ICD-10-CM | POA: Diagnosis not present

## 2017-10-19 DIAGNOSIS — Z5112 Encounter for antineoplastic immunotherapy: Secondary | ICD-10-CM | POA: Diagnosis not present

## 2017-10-19 DIAGNOSIS — M25551 Pain in right hip: Secondary | ICD-10-CM | POA: Diagnosis not present

## 2017-10-19 LAB — COMPREHENSIVE METABOLIC PANEL
ALBUMIN: 3.7 g/dL (ref 3.5–5.0)
ALT: 61 U/L — ABNORMAL HIGH (ref 14–54)
ANION GAP: 11 (ref 5–15)
AST: 20 U/L (ref 15–41)
Alkaline Phosphatase: 62 U/L (ref 38–126)
BUN: 17 mg/dL (ref 6–20)
CHLORIDE: 108 mmol/L (ref 101–111)
CO2: 20 mmol/L — ABNORMAL LOW (ref 22–32)
Calcium: 8.8 mg/dL — ABNORMAL LOW (ref 8.9–10.3)
Creatinine, Ser: 0.83 mg/dL (ref 0.44–1.00)
GFR calc Af Amer: >60 mL/min (ref >60)
GFR calc non Af Amer: >60 mL/min (ref >60)
GLUCOSE: 106 mg/dL — AB (ref 65–99)
POTASSIUM: 4.2 mmol/L (ref 3.5–5.1)
SODIUM: 139 mmol/L (ref 135–145)
Total Bilirubin: 0.4 mg/dL (ref 0.3–1.2)
Total Protein: 8.2 g/dL — ABNORMAL HIGH (ref 6.5–8.1)

## 2017-10-19 LAB — CBC WITH DIFFERENTIAL/PLATELET
BASOS ABS: 0 10*3/uL (ref 0–0.1)
BASOS PCT: 0 %
EOS ABS: 0.3 10*3/uL (ref 0–0.7)
EOS PCT: 4 %
HCT: 31.3 % — ABNORMAL LOW (ref 35.0–47.0)
Hemoglobin: 10.6 g/dL — ABNORMAL LOW (ref 12.0–16.0)
Lymphocytes Relative: 59 %
Lymphs Abs: 3.9 10*3/uL — ABNORMAL HIGH (ref 1.0–3.6)
MCH: 33.3 pg (ref 26.0–34.0)
MCHC: 33.7 g/dL (ref 32.0–36.0)
MCV: 98.7 fL (ref 80.0–100.0)
MONO ABS: 0.3 10*3/uL (ref 0.2–0.9)
Monocytes Relative: 5 %
NEUTROS ABS: 2.1 10*3/uL (ref 1.4–6.5)
Neutrophils Relative %: 32 %
PLATELETS: 205 10*3/uL (ref 150–440)
RBC: 3.17 MIL/uL — ABNORMAL LOW (ref 3.80–5.20)
RDW: 16.9 % — ABNORMAL HIGH (ref 11.5–14.5)
WBC: 6.6 10*3/uL (ref 3.6–11.0)

## 2017-10-19 NOTE — Patient Instructions (Signed)
NO MORE REVLIMID 3/29 last infusion of daratumumab prior to transplant

## 2017-10-19 NOTE — Assessment & Plan Note (Addendum)
#  Multiple myeloma-stage III. Transplant eligible- IgA lambda [sep 5208]; cytogenetics/fish- 11:14-STANDARD/? High RISK]; pretreatment-M protein 3.7; lambda light chain 1298.   # Currently on second line therapy with Rev-dara-dex [jan 11th]-given partial response to RVD; interruption sec to infections [bronchiis/pneumonia].  March 2019-M protein 1.0 g; lambda light chains 391. [Patient of note had multiple interruptions of her Revlimid therapy also]  # On Rev 20 mg 2 w-ON & 1 w-OFF; [LAST DOSE 10/13/2017] continue dex 40 mg weekly; dara # 9 on 3/29; and will STOP further infusion. Will plan Bone marrow next week.   #Right hip pain-MRI shows lesion-significant improvement in pain on daratumumab.   # Skeletal lesion- on  X [place]- proceed today ca- 8.6 recommend ca+vit D; X-geva 3/15.    # Infectious prophylaxis- continue acyclovir/ DVT prohylaxis- on asprin 325 mg/day.   # [facility] on 28th at [facility]; spoke to [place];  # follow up 2 week/labs; NO TREATMENT.  Also to review results of the bone marrow biopsy.

## 2017-10-20 ENCOUNTER — Other Ambulatory Visit: Payer: Medicare Other

## 2017-10-20 ENCOUNTER — Ambulatory Visit: Payer: Medicare Other | Admitting: Internal Medicine

## 2017-10-20 DIAGNOSIS — I071 Rheumatic tricuspid insufficiency: Secondary | ICD-10-CM | POA: Diagnosis not present

## 2017-10-20 DIAGNOSIS — Z113 Encounter for screening for infections with a predominantly sexual mode of transmission: Secondary | ICD-10-CM | POA: Diagnosis not present

## 2017-10-20 DIAGNOSIS — Z1159 Encounter for screening for other viral diseases: Secondary | ICD-10-CM | POA: Diagnosis not present

## 2017-10-20 DIAGNOSIS — Z5181 Encounter for therapeutic drug level monitoring: Secondary | ICD-10-CM | POA: Diagnosis not present

## 2017-10-20 DIAGNOSIS — Z114 Encounter for screening for human immunodeficiency virus [HIV]: Secondary | ICD-10-CM | POA: Diagnosis not present

## 2017-10-20 DIAGNOSIS — R768 Other specified abnormal immunological findings in serum: Secondary | ICD-10-CM | POA: Diagnosis not present

## 2017-10-20 DIAGNOSIS — I361 Nonrheumatic tricuspid (valve) insufficiency: Secondary | ICD-10-CM | POA: Diagnosis not present

## 2017-10-20 DIAGNOSIS — R931 Abnormal findings on diagnostic imaging of heart and coronary circulation: Secondary | ICD-10-CM | POA: Diagnosis not present

## 2017-10-20 DIAGNOSIS — Z79899 Other long term (current) drug therapy: Secondary | ICD-10-CM | POA: Diagnosis not present

## 2017-10-20 DIAGNOSIS — M47814 Spondylosis without myelopathy or radiculopathy, thoracic region: Secondary | ICD-10-CM | POA: Diagnosis not present

## 2017-10-20 DIAGNOSIS — C9000 Multiple myeloma not having achieved remission: Secondary | ICD-10-CM | POA: Diagnosis not present

## 2017-10-20 DIAGNOSIS — R001 Bradycardia, unspecified: Secondary | ICD-10-CM | POA: Diagnosis not present

## 2017-10-20 DIAGNOSIS — Z6740 Type O blood, Rh positive: Secondary | ICD-10-CM | POA: Diagnosis not present

## 2017-10-21 ENCOUNTER — Inpatient Hospital Stay: Payer: Medicare Other

## 2017-10-21 VITALS — BP 120/78 | HR 59 | Temp 96.4°F | Resp 18 | Wt 157.0 lb

## 2017-10-21 DIAGNOSIS — M25551 Pain in right hip: Secondary | ICD-10-CM | POA: Diagnosis not present

## 2017-10-21 DIAGNOSIS — C9002 Multiple myeloma in relapse: Secondary | ICD-10-CM | POA: Diagnosis not present

## 2017-10-21 DIAGNOSIS — M25559 Pain in unspecified hip: Secondary | ICD-10-CM | POA: Diagnosis not present

## 2017-10-21 DIAGNOSIS — I10 Essential (primary) hypertension: Secondary | ICD-10-CM | POA: Diagnosis not present

## 2017-10-21 DIAGNOSIS — R768 Other specified abnormal immunological findings in serum: Secondary | ICD-10-CM | POA: Insufficient documentation

## 2017-10-21 DIAGNOSIS — M899 Disorder of bone, unspecified: Secondary | ICD-10-CM | POA: Diagnosis not present

## 2017-10-21 DIAGNOSIS — Z5112 Encounter for antineoplastic immunotherapy: Secondary | ICD-10-CM | POA: Diagnosis not present

## 2017-10-21 MED ORDER — SODIUM CHLORIDE 0.9 % IV SOLN
1000.0000 mg | Freq: Once | INTRAVENOUS | Status: AC
  Administered 2017-10-21: 1000 mg via INTRAVENOUS
  Filled 2017-10-21: qty 40

## 2017-10-21 MED ORDER — METHYLPREDNISOLONE SODIUM SUCC 125 MG IJ SOLR
125.0000 mg | Freq: Once | INTRAMUSCULAR | Status: AC
  Administered 2017-10-21: 125 mg via INTRAVENOUS
  Filled 2017-10-21: qty 2

## 2017-10-21 MED ORDER — SODIUM CHLORIDE 0.9 % IV SOLN
1000.0000 mg | Freq: Once | INTRAVENOUS | Status: DC
  Filled 2017-10-21: qty 50

## 2017-10-21 MED ORDER — DIPHENHYDRAMINE HCL 25 MG PO CAPS
50.0000 mg | ORAL_CAPSULE | Freq: Once | ORAL | Status: AC
Start: 2017-10-21 — End: 2017-10-21
  Administered 2017-10-21: 50 mg via ORAL
  Filled 2017-10-21: qty 2

## 2017-10-21 MED ORDER — PROCHLORPERAZINE MALEATE 10 MG PO TABS
10.0000 mg | ORAL_TABLET | Freq: Once | ORAL | Status: AC
  Administered 2017-10-21: 10 mg via ORAL
  Filled 2017-10-21: qty 1

## 2017-10-21 MED ORDER — SODIUM CHLORIDE 0.9 % IV SOLN
Freq: Once | INTRAVENOUS | Status: AC
  Administered 2017-10-21: 09:00:00 via INTRAVENOUS
  Filled 2017-10-21: qty 1000

## 2017-10-21 MED ORDER — ACETAMINOPHEN 325 MG PO TABS
650.0000 mg | ORAL_TABLET | Freq: Once | ORAL | Status: AC
  Administered 2017-10-21: 650 mg via ORAL
  Filled 2017-10-21: qty 2

## 2017-10-23 NOTE — Progress Notes (Signed)
[place] NOTE  Patient Care Team: Center, [facility] as PCP - General (General Practice)  CHIEF COMPLAINTS/PURPOSE OF CONSULTATION: Multiple myeloma Multiple myeloma.  #  Oncology History   # SEP 2018- MULTIPLE MYELOMA IgALamda [2.5 gm/dl; K/L= 88/1298]; STAGE III [beta 2 microglobulin=5.5] [presented with acute renal failure; anemia; NO hypercalcemia; Skeletal survey-Normal]; BMBx- 45% plasma cells; FISH-POSITIVE 11:14 translocation.[STANDARD-high RISK]/cyto-Normal; SEP 2018- PET- L3 posterior element lesion.   # 9/14- velcade SQ twice weekly/Dex 40 mg/week; OCT 5th 2018-Start R [35m]VD; 3cycles of RVD- PARTAL RESPONSE  # Jan 11th 2019-Dara-Rev-Dex   # Acute renal failure [Dr.Singh; Proteinuria 1.5gm/day ]; acyclovir/Asprin     Multiple myeloma in relapse (HCC)     HISTORY OF PRESENTING ILLNESS:  Brandi MORGENTHALER89y.o.  female multiple myeloma stage III transplant eligible currently on Dara-rev-Dex is here for follow-up.  In the interim patient has been evaluated at [facility]/transplant team awaiting workup this week.  Patient states that she has been feeling the best.  Left hip pain is improved.  No nausea no vomiting.  Patient's last Revlimid was on 321.  ROS: A complete 10 point review of system is done which is negative except mentioned above in history of present illness  MEDICAL HISTORY:  Past Medical History:  Diagnosis Date  . Hypertension   . [place]     SURGICAL HISTORY: History reviewed. No pertinent surgical history.  SOCIAL HISTORY:  [place] Her daughter lives close by. She smokes about 4-5 cigarettes a day. No alcohol. She used to work in a [employer] Currently retired.  Social History   Socioeconomic History  . Marital status: Single    Spouse name: Not on file  . Number of children: Not on file  . Years of education: Not on file  . Highest education level: Not on file   Occupational History  . Not on file  Social Needs  . Financial resource strain: Not on file  . Food insecurity:    Worry: Not on file    Inability: Not on file  . Transportation needs:    Medical: Not on file    Non-medical: Not on file  Tobacco Use  . Smoking status: Current Every Day Smoker    Packs/day: 0.50    Types: Cigarettes  . Smokeless tobacco: Never Used  Substance and Sexual Activity  . Alcohol use: No  . Drug use: No  . Sexual activity: Not on file  Lifestyle  . Physical activity:    Days per week: Not on file    Minutes per session: Not on file  . Stress: Not on file  Relationships  . Social connections:    Talks on phone: Not on file    Gets together: Not on file    Attends religious service: Not on file    Active member of club or organization: Not on file    Attends meetings of clubs or organizations: Not on file    Relationship status: Not on file  . Intimate partner violence:    Fear of current or ex partner: Not on file    Emotionally abused: Not on file    Physically abused: Not on file    Forced sexual activity: Not on file  Other Topics Concern  . Not on file  Social History Narrative   Lives at home with family. Independent at baseline    FAMILY HISTORY: Family History  Problem Relation Age of  Onset  . Pneumonia Mother   . Seizures Father     ALLERGIES:  has No Known Allergies.  MEDICATIONS:  Current Outpatient Medications  Medication Sig Dispense Refill  . acetaminophen (TYLENOL) 500 MG tablet Take 1 tablet (500 mg total) by mouth every 6 (six) hours as needed (TAKE 1 TABLET DAILY X 4 DAYS-STARTING 2 DAYS PRIOR TO CHEMOTHERAPY.). (Patient taking differently: Take 1,000 mg by mouth every 6 (six) hours as needed (TAKE 1 TABLET DAILY X 4 DAYS-STARTING 2 DAYS PRIOR TO CHEMOTHERAPY.). ) 30 tablet 0  . acyclovir (ZOVIRAX) 400 MG tablet One pill a day [to prevent shingles] 30 tablet 6  . amLODipine (NORVASC) 10 MG tablet Take 1 tablet (10 mg  total) by mouth daily. 30 tablet 4  . cyanocobalamin 100 MCG tablet Take 1 tablet (100 mcg total) by mouth daily. 30 tablet 0  . dronabinol (MARINOL) 5 MG capsule Take 1 capsule (5 mg total) by mouth 2 (two) times daily before lunch and supper. 60 capsule 3  . feeding supplement, ENSURE ENLIVE, (ENSURE ENLIVE) LIQD Take 237 mLs by mouth 2 (two) times daily between meals. 60 Bottle 0  . HYDROcodone-acetaminophen (NORCO) 5-325 MG tablet Take 1 tablet by mouth every 8 (eight) hours as needed for moderate pain. 60 tablet 0  . loratadine (CLARITIN) 10 MG tablet Take 1 tablet (10 mg total) by mouth daily. Take daily x 4 days (start 2 days prior to chemo) 30 tablet 3  . montelukast (SINGULAIR) 10 MG tablet Take 1 tablet (10 mg total) by mouth at bedtime. Start 2 days prior to chemo. Take for 4 days total 30 tablet 3  . pantoprazole (PROTONIX) 40 MG tablet Take 1 tablet (40 mg total) by mouth daily.    . polyethylene glycol (MIRALAX / GLYCOLAX) packet Take 17 g by mouth daily as needed for mild constipation. 14 each 0  . potassium chloride SA (K-DUR,KLOR-CON) 20 MEQ tablet Take 1 tablet (20 mEq total) by mouth 2 (two) times daily. 60 tablet 3  . ranitidine (ZANTAC) 150 MG tablet Take 1 tablet (150 mg total) by mouth 2 (two) times daily. START TAKING 2 DAYS PRIOR TO CHEMOTHERAPY. TAKE FOR A TOTAL OF 4 DAYS    . aspirin 325 MG tablet Take 1 tablet by mouth daily.    [name] dexamethasone (DECADRON) 4 MG tablet Take 10 tablets (40 mg total) by mouth once a week. 10 pills in AM with food. (Patient taking differently: Take 4 mg by mouth 2 (two) times daily. Take with food - (TAKE 1 TABLET IN THE AM AND 1 TABLET IN THE PM-)  start on 2 days prior to chemotherapy infusion and take for a total of 4 days).) 40 tablet 4  . REVLIMID 20 MG capsule TAKE 1 CAPSULE BY MOUTH EVERY DAY FOR 14 DAYS 14 capsule 0  . temazepam (RESTORIL) 7.5 MG capsule Take 1 capsule (7.5 mg total) by mouth at bedtime. (Patient not taking: Reported on  10/19/2017) 30 capsule 0   No current facility-administered medications for this visit.       [name]  PHYSICAL EXAMINATION: ECOG PERFORMANCE STATUS: 1 - Symptomatic but completely ambulatory  Vitals:   10/19/17 1455  BP: (!) 153/87  Pulse: 67  Resp: 16  Temp: 98.1 F (36.7 C)   Filed Weights   10/19/17 1455  Weight: 152 lb 3.2 oz (69 kg)    GENERAL: Well-nourished well-developed; Alert, no distress and comfortable. She is a alone. EYES: no pallor or icterus  OROPHARYNX: no thrush or ulceration; good dentition  NECK: supple, no masses felt LYMPH:  no palpable lymphadenopathy in the cervical, axillary or inguinal regions LUNGS: clear to auscultation and  No wheeze or crackles HEART/CVS: regular rate & rhythm and no murmurs; No lower extremity edema ABDOMEN: abdomen soft, non-tender and normal bowel sounds Musculoskeletal:no cyanosis of digits and no clubbing  PSYCH: alert & oriented x 3 with fluent speech NEURO: no focal motor/sensory deficits SKIN: Faint rash on her torso/   LABORATORY DATA:  I have reviewed the data as listed Lab Results  Component Value Date   WBC 6.6 10/19/2017   HGB 10.6 (L) 10/19/2017   HCT 31.3 (L) 10/19/2017   MCV 98.7 10/19/2017   PLT 205 10/19/2017   Recent Labs    09/22/17 0915 09/30/17 0808 10/06/17 0837 10/19/17 1425  NA 137 139 138 139  K 3.4* 3.7 3.9 4.2  CL 104 109 102 108  CO2 '23 23 26 ' 20*  GLUCOSE 85 101* 99 106*  BUN '13 14 17 17  ' CREATININE 0.77 0.62 0.75 0.83  CALCIUM 8.5* 8.7* 9.3 8.8*  GFRNONAA >60 >60 >60 >60  GFRAA >60 >60 >60 >60  PROT 8.1 8.1  --  8.2*  ALBUMIN 3.3* 3.3*  --  3.7  AST 25 23  --  20  ALT 31 25  --  61*  ALKPHOS 69 60  --  62  BILITOT 0.4 0.2*  --  0.4    RADIOGRAPHIC STUDIES: I have personally reviewed the radiological images as listed and agreed with the findings in the report. No results found. IMPRESSION: 1. There is an FDG avid lytic lesion in the right posterior element of L3 consistent  with myelomatous involvement. More mild uptake without CT correlate in L5 is nonspecific. Recommend attention on follow-up. No other bony involvement identified. 2. Atherosclerotic changes seen in the aorta. Coronary artery calcifications. 3. Nodular uptake in the skin and subcutaneous tissues of the right pelvis, likely an injection site. Recommend clinical correlation to exclude a skin lesion. Aortic Atherosclerosis (ICD10-I70.0).   Electronically Signed   By: Dorise Bullion III M.D   On: 04/21/2017 15:09  --------------------------  Results for LEILENE, DIPRIMA (MRN [id number]) as of 10/06/2017 09:36  Ref. Range 04/05/2017 22:43 04/06/2017 14:12 04/10/2017 11:17 04/29/2017 10:10 04/29/2017 10:16 05/26/2017 08:30 06/23/2017 09:28 07/22/2017 11:40 09/01/2017 08:58 09/01/2017 09:06 09/30/2017 08:01 09/30/2017 08:08  M Protein SerPl Elph-Mcnc Latest Ref Range: Not Observed g/dL    3.2 (H)  2.9 (H) 2.2 (H) 2.4 (H) 2.4 (H)  1.0 (H)      Results for JOHNIE, STADEL (MRN [id number]) as of 10/06/2017 09:36  Ref. Range 04/05/2017 22:43 04/06/2017 14:12 04/10/2017 11:17 04/29/2017 10:10 04/29/2017 10:16 05/26/2017 08:30 06/23/2017 09:28 07/22/2017 11:40 09/01/2017 08:58 09/01/2017 09:06 09/30/2017 08:01 09/30/2017 08:08  Kappa free light chain Latest Ref Range: 3.3 - 19.4 mg/L  88.6 (H)   56.9 (H) 38.6 (H) 33.7 (H) 23.0 (H)  29.2 (H)  24.6 (H)  Lamda free light chains Latest Ref Range: 5.7 - 26.3 mg/L  1,298.8 (H)   828.9 (H) 944.2 (H) 739.8 (H) 556.6 (H)  391.3 (H)  391.3 (H)  Kappa, lamda light chain ratio Latest Ref Range: 0.26 - 1.65   0.07 (L)   0.07 (L) 0.04 (L) 0.05 (L) 0.04 (L)  0.07 (L)  0.06 (L)        ASSESSMENT & PLAN:   Multiple myeloma in relapse (HCC) # Multiple myeloma-stage III. Transplant eligible-  IgA lambda [sep 2018]; cytogenetics/fish- 11:14-STANDARD/? High RISK]; pretreatment-M protein 3.7; lambda light chain 1298.   # Currently on second line therapy with Rev-dara-dex [jan 11th]-given  partial response to RVD; interruption sec to infections [bronchiis/pneumonia].  March 2019-M protein 1.0 g; lambda light chains 391. [Patient of note had multiple interruptions of her Revlimid therapy also]  # On Rev 20 mg 2 w-ON & 1 w-OFF; [LAST DOSE 10/13/2017] continue dex 40 mg weekly; dara # 9 on 3/29; and will STOP further infusion. Will plan Bone marrow next week.   #Right hip pain-MRI shows lesion-significant improvement in pain on daratumumab.   # Skeletal lesion- on  X [place]- proceed today ca- 8.6 recommend ca+vit D; X-geva 3/15.    # Infectious prophylaxis- continue acyclovir/ DVT prohylaxis- on asprin 325 mg/day.   # [facility] on [facility] at [facility]; spoke to Stella;  # follow up 2 week/labs; NO TREATMENT.   All questions were answered. The patient knows to call the clinic with any problems, questions or concerns.    [name], MD 10/23/2017 9:55 AM

## 2017-10-24 ENCOUNTER — Telehealth: Payer: Self-pay | Admitting: *Deleted

## 2017-10-24 NOTE — Telephone Encounter (Signed)
Received phone call from specialty scheduling. Bone marrow bx date on 4/8. Brandi [name], patient needs bone marrow biopsy asap this week as patient is planning a bone marrow transplant.  Please see if this date can be changed to earlier this week.

## 2017-10-24 NOTE — Telephone Encounter (Signed)
Per scheduling, this is the first available time for bone marrow biopsy unless patient goes to [facility] location. Patient may not be able to go to [place] due to transportation issues.  Dr. B made aware. He had me contact [place]- patient bone marrow transplant coordinator. Jackelyn Poling states that 4/8 date for bone marrow will be fine.  [name], please contact patient with bone marrow biopsy date.

## 2017-10-27 ENCOUNTER — Other Ambulatory Visit: Payer: Self-pay | Admitting: Radiology

## 2017-10-28 ENCOUNTER — Other Ambulatory Visit: Payer: Self-pay | Admitting: Radiology

## 2017-10-31 ENCOUNTER — Ambulatory Visit
Admission: RE | Admit: 2017-10-31 | Discharge: 2017-10-31 | Disposition: A | Discharge status: Home / Self Care | Payer: Medicare HMO | Source: Ambulatory Visit | Attending: Internal Medicine | Admitting: Internal Medicine

## 2017-10-31 ENCOUNTER — Other Ambulatory Visit (HOSPITAL_COMMUNITY)
Admission: RE | Admit: 2017-10-31 | Disposition: A | Discharge status: Home / Self Care | Payer: Medicare HMO | Source: Ambulatory Visit | Attending: Internal Medicine | Admitting: Internal Medicine

## 2017-10-31 ENCOUNTER — Telehealth: Payer: Self-pay | Admitting: Internal Medicine

## 2017-10-31 DIAGNOSIS — C9002 Multiple myeloma in relapse: Secondary | ICD-10-CM | POA: Insufficient documentation

## 2017-10-31 DIAGNOSIS — C9000 Multiple myeloma not having achieved remission: Secondary | ICD-10-CM | POA: Diagnosis not present

## 2017-10-31 DIAGNOSIS — D4989 Neoplasm of unspecified behavior of other specified sites: Secondary | ICD-10-CM | POA: Diagnosis not present

## 2017-10-31 DIAGNOSIS — D649 Anemia, unspecified: Secondary | ICD-10-CM | POA: Diagnosis not present

## 2017-10-31 LAB — CBC WITH DIFFERENTIAL/PLATELET
Basophils Absolute: 0 10*3/uL (ref 0–0.1)
Basophils Relative: 1 %
EOS PCT: 2 %
Eosinophils Absolute: 0.1 10*3/uL (ref 0–0.7)
HCT: 31.3 % — ABNORMAL LOW (ref 35.0–47.0)
Hemoglobin: 10.5 g/dL — ABNORMAL LOW (ref 12.0–16.0)
LYMPHS ABS: 3.1 10*3/uL (ref 1.0–3.6)
LYMPHS PCT: 57 %
MCH: 33.1 pg (ref 26.0–34.0)
MCHC: 33.6 g/dL (ref 32.0–36.0)
MCV: 98.4 fL (ref 80.0–100.0)
MONO ABS: 0.2 10*3/uL (ref 0.2–0.9)
Monocytes Relative: 3 %
Neutro Abs: 2 10*3/uL (ref 1.4–6.5)
Neutrophils Relative %: 37 %
PLATELETS: 284 10*3/uL (ref 150–440)
RBC: 3.18 MIL/uL — AB (ref 3.80–5.20)
RDW: 17.1 % — AB (ref 11.5–14.5)
WBC: 5.4 10*3/uL (ref 3.6–11.0)

## 2017-10-31 LAB — PROTIME-INR
INR: 0.87
Prothrombin Time: 11.8 seconds (ref 11.4–15.2)

## 2017-10-31 MED ORDER — HEPARIN SOD (PORK) LOCK FLUSH 100 UNIT/ML IV SOLN
INTRAVENOUS | Status: AC
  Filled 2017-10-31: qty 5

## 2017-10-31 MED ORDER — SODIUM CHLORIDE 0.9 % IV SOLN
INTRAVENOUS | Status: DC
  Administered 2017-10-31: 09:00:00 via INTRAVENOUS

## 2017-10-31 MED ORDER — FENTANYL CITRATE (PF) 100 MCG/2ML IJ SOLN
INTRAMUSCULAR | Status: AC
  Filled 2017-10-31: qty 2

## 2017-10-31 MED ORDER — MIDAZOLAM HCL 5 MG/5ML IJ SOLN
INTRAMUSCULAR | Status: AC
  Filled 2017-10-31: qty 5

## 2017-10-31 NOTE — Discharge Instructions (Signed)
Bone Marrow Aspiration and Bone Marrow Biopsy, Adult °Bone marrow aspiration and bone marrow biopsy are procedures that are done to diagnose blood disorders. You may also have one of these procedures to help diagnose infections or some types of cancer. °Bone marrow is the soft tissue that is inside your bones. Blood cells are produced in bone marrow. For bone marrow aspiration, a sample of tissue in liquid form is removed from inside your bone. For a bone marrow biopsy, a small core of bone marrow tissue is removed. These samples are examined under a microscope or tested in a lab. °You may need these procedures if you have an abnormal complete blood count (CBC). The aspiration or biopsy sample is usually taken from the top of your hip bone. Sometimes, an aspiration sample is taken from your chest bone (sternum). °Tell a health care provider about: °· Any allergies you have. °· All medicines you are taking, including vitamins, herbs, eye drops, creams, and over-the-counter medicines. °· Any problems you or family members have had with anesthetic medicines. °· Any blood or bone disorders you have. °· Any surgeries you have had. °· Any medical conditions you have. °· Whether you are pregnant or you think that you may be pregnant. °What are the risks? °Generally, this is a safe procedure. However, problems may occur, including: °· Infection. °· Bleeding. °· Persistent pain after the procedure. °· Cracking (fracture) of the bone. °· Allergic reactions to medicines. ° °What happens before the procedure? °Staying hydrated °Follow instructions from your health care provider about hydration, which may include: °· Up to 2 hours before the procedure - you may continue to drink clear liquids, such as water, clear fruit juice, black coffee, and plain tea. ° °Eating and drinking restrictions °Follow instructions from your health care provider about eating and drinking, which may include: °· 8 hours before the procedure - stop  eating heavy meals or foods such as meat, fried foods, or fatty foods. °· 6 hours before the procedure - stop eating light meals or foods, such as toast or cereal. °· 6 hours before the procedure - stop drinking milk or drinks that contain milk. °· 2 hours before the procedure - stop drinking clear liquids. ° °Medicines °· Ask your health care provider about: °? Changing or stopping your regular medicines. This is especially important if you are taking diabetes medicines or blood thinners. °? Taking medicines such as aspirin and ibuprofen. These medicines can thin your blood. Do not take these medicines before your procedure if your health care provider instructs you not to. °· You may be given antibiotic medicine to help prevent infection. °General instructions °· Plan to have someone take you home after the procedure. °· If you will be going home right after the procedure, plan to have someone with you for 24 hours. °· Ask your health care provider how your surgical site will be marked or identified. °What happens during the procedure? °· To reduce your risk of infection: °? Your health care team will wash or sanitize their hands. °? Your skin will be washed with soap. °? Hair may be removed from the surgical area. °· An IV tube may be inserted into one of your veins. °· The injection site will be cleaned with a germ-killing solution (antiseptic). °· You will be given one or more of the following: °? A medicine to help you relax (sedative). °? A medicine to numb the area (local anesthetic). °? A medicine to make you fall asleep (  asleep (general anesthetic).  The bone marrow sample will be removed as follows: ? For an aspiration, a hollow needle will be inserted through your skin and into your bone. Bone marrow fluid will be drawn up into a syringe. ? For a biopsy, your health care provider will use a hollow needle to remove a core of tissue from your bone marrow.  The needle will be removed.  A bandage (dressing)  will be placed over the insertion site and taped in place. The procedure may vary among health care providers and hospitals. What happens after the procedure?  Your blood pressure, heart rate, breathing rate, and blood oxygen level will be monitored until the medicines you were given have worn off.  Your IV tube will be removed, and the insertion site will be checked for bleeding.  Do not drive for 24 hours if you were given a sedative. This information is not intended to replace advice given to you by your health care provider. Make sure you discuss any questions you have with your health care provider. Document Released: 07/15/2004 Document Revised: 01/30/2016 Document Reviewed: 12/24/2015 Elsevier Interactive Patient Education  2018 [place].   Bone Marrow Aspiration and Bone Marrow Biopsy, Adult, Care After This sheet gives you information about how to care for yourself after your procedure. Your health care provider may also give you more specific instructions. If you have problems or questions, contact your health care provider. What can I expect after the procedure? After the procedure, it is common to have:  Mild pain and tenderness.  Swelling.  Bruising.  Follow these instructions at home:  Take over-the-counter or prescription medicines only as told by your health care provider.  Do not take baths, swim, or use a hot tub until your health care provider approves. Ask if you can take a shower or have a sponge bath.  Follow instructions from your health care provider about how to take care of the puncture site. Make sure you: ? Wash your hands with soap and water before you change your bandage (dressing). If soap and water are not available, use hand sanitizer. ? Change your dressing as told by your health care provider.  Check your puncture siteevery day for signs of infection. Check for: ? More redness, swelling, or pain. ? More fluid or blood. ? Warmth. ? Pus or  a bad smell.  Return to your normal activities as told by your health care provider. Ask your health care provider what activities are safe for you.  Do not drive for 24 hours if you were given a medicine to help you relax (sedative).  Keep all follow-up visits as told by your health care provider. This is important. Contact a health care provider if:  You have more redness, swelling, or pain around the puncture site.  You have more fluid or blood coming from the puncture site.  Your puncture site feels warm to the touch.  You have pus or a bad smell coming from the puncture site.  You have a fever.  Your pain is not controlled with medicine. This information is not intended to replace advice given to you by your health care provider. Make sure you discuss any questions you have with your health care provider. Document Released: 01/29/2005 Document Revised: 01/30/2016 Document Reviewed: 12/24/2015 Elsevier Interactive Patient Education  2018 [place]. Bone Marrow Aspiration and Bone Marrow Biopsy, Adult, Care After This sheet gives you information about how to care for yourself after your procedure. Your  health care provider may also give you more specific instructions. If you have problems or questions, contact your health care provider. What can I expect after the procedure? After the procedure, it is common to have:  Mild pain and tenderness.  Swelling.  Bruising.  Follow these instructions at home:  Take over-the-counter or prescription medicines only as told by your health care provider.  Do not take baths, swim, or use a hot tub until your health care provider approves. Ask if you can take a shower or have a sponge bath.  Follow instructions from your health care provider about how to take care of the puncture site. Make sure you: ? Wash your hands with soap and water before you change your bandage (dressing). If soap and water are not available, use hand  sanitizer. ? Change your dressing as told by your health care provider.  Check your puncture siteevery day for signs of infection. Check for: ? More redness, swelling, or pain. ? More fluid or blood. ? Warmth. ? Pus or a bad smell.  Return to your normal activities as told by your health care provider. Ask your health care provider what activities are safe for you.  Do not drive for 24 hours if you were given a medicine to help you relax (sedative).  Keep all follow-up visits as told by your health care provider. This is important. Contact a health care provider if:  You have more redness, swelling, or pain around the puncture site.  You have more fluid or blood coming from the puncture site.  Your puncture site feels warm to the touch.  You have pus or a bad smell coming from the puncture site.  You have a fever.  Your pain is not controlled with medicine. This information is not intended to replace advice given to you by your health care provider. Make sure you discuss any questions you have with your health care provider. Document Released: 01/29/2005 Document Revised: 01/30/2016 Document Reviewed: 12/24/2015 Elsevier Interactive Patient Education  2018 [employer].

## 2017-10-31 NOTE — Progress Notes (Signed)
Dr. Annamaria Boots by to speak with pt. Post procedure. No complications at present to biopsy site.

## 2017-10-31 NOTE — Telephone Encounter (Signed)
Spoke to [place] from [place] recommend dara.  Please schedule pt on 4/12 for daratumumab; keep appt with MD as planned on 4/10/labs. Pt had bone marrow  On 4/08.

## 2017-10-31 NOTE — H&P (Signed)
  Pre Procedure note    Brandi [name] has been scheduled for BM asp and core today. The various methods of treatment have been discussed with the patient. After consideration of the risks, benefits and treatment options the patient has consented to the planned procedure.   The patient has been seen and labs reviewed. There are no changes in the patient's condition to prevent proceeding with the planned procedure today.  This will be a non sedation case because the pt ate breakfast.  Recent labs:  Lab Results  Component Value Date   WBC 5.4 10/31/2017   HGB 10.5 (L) 10/31/2017   HCT 31.3 (L) 10/31/2017   PLT 284 10/31/2017   GLUCOSE 106 (H) 10/19/2017   ALT 61 (H) 10/19/2017   AST 20 10/19/2017   NA 139 10/19/2017   K 4.2 10/19/2017   CL 108 10/19/2017   CREATININE 0.83 10/19/2017   BUN 17 10/19/2017   CO2 20 (L) 10/19/2017   TSH 0.071 (L) 04/06/2017   INR 0.87 10/31/2017    Greggory Keen, MD 10/31/2017 9:24 AM

## 2017-10-31 NOTE — Procedures (Signed)
Myeloma  S/p RT ILIAC BM ASP AND CORE  No comp Stable EBL MIN FULL report in pacs

## 2017-10-31 NOTE — Sedation Documentation (Signed)
Bone marrow biopsy completed. No sedation given per MD secondary to pt. Ate breakfast approx. 07:00

## 2017-11-02 ENCOUNTER — Inpatient Hospital Stay: Payer: Medicare HMO | Attending: Internal Medicine

## 2017-11-02 ENCOUNTER — Inpatient Hospital Stay (HOSPITAL_BASED_OUTPATIENT_CLINIC_OR_DEPARTMENT_OTHER): Payer: Medicare HMO | Admitting: Internal Medicine

## 2017-11-02 ENCOUNTER — Other Ambulatory Visit: Payer: Self-pay

## 2017-11-02 ENCOUNTER — Encounter: Payer: Self-pay | Admitting: Internal Medicine

## 2017-11-02 VITALS — BP 137/86 | HR 63 | Temp 97.9°F | Resp 20 | Ht 62.0 in | Wt 158.2 lb

## 2017-11-02 DIAGNOSIS — M25551 Pain in right hip: Secondary | ICD-10-CM | POA: Insufficient documentation

## 2017-11-02 DIAGNOSIS — M899 Disorder of bone, unspecified: Secondary | ICD-10-CM

## 2017-11-02 DIAGNOSIS — F17210 Nicotine dependence, cigarettes, uncomplicated: Secondary | ICD-10-CM | POA: Diagnosis not present

## 2017-11-02 DIAGNOSIS — Z5112 Encounter for antineoplastic immunotherapy: Secondary | ICD-10-CM | POA: Insufficient documentation

## 2017-11-02 DIAGNOSIS — C9002 Multiple myeloma in relapse: Secondary | ICD-10-CM

## 2017-11-02 DIAGNOSIS — I10 Essential (primary) hypertension: Secondary | ICD-10-CM | POA: Diagnosis not present

## 2017-11-02 DIAGNOSIS — Z79899 Other long term (current) drug therapy: Secondary | ICD-10-CM | POA: Insufficient documentation

## 2017-11-02 DIAGNOSIS — Z5181 Encounter for therapeutic drug level monitoring: Secondary | ICD-10-CM | POA: Insufficient documentation

## 2017-11-02 DIAGNOSIS — R69 Illness, unspecified: Secondary | ICD-10-CM | POA: Diagnosis not present

## 2017-11-02 LAB — COMPREHENSIVE METABOLIC PANEL
ALT: 16 U/L (ref 14–54)
AST: 23 U/L (ref 15–41)
Albumin: 3.6 g/dL (ref 3.5–5.0)
Alkaline Phosphatase: 59 U/L (ref 38–126)
Anion gap: 9 (ref 5–15)
BILIRUBIN TOTAL: 0.4 mg/dL (ref 0.3–1.2)
BUN: 13 mg/dL (ref 6–20)
CO2: 20 mmol/L — ABNORMAL LOW (ref 22–32)
CREATININE: 0.84 mg/dL (ref 0.44–1.00)
Calcium: 9.4 mg/dL (ref 8.9–10.3)
Chloride: 108 mmol/L (ref 101–111)
Glucose, Bld: 99 mg/dL (ref 65–99)
Potassium: 3.6 mmol/L (ref 3.5–5.1)
Sodium: 137 mmol/L (ref 135–145)
TOTAL PROTEIN: 8.4 g/dL — AB (ref 6.5–8.1)

## 2017-11-02 LAB — CBC WITH DIFFERENTIAL/PLATELET
BASOS ABS: 0 10*3/uL (ref 0–0.1)
BASOS PCT: 1 %
EOS ABS: 0.1 10*3/uL (ref 0–0.7)
EOS PCT: 2 %
HCT: 29.4 % — ABNORMAL LOW (ref 35.0–47.0)
Hemoglobin: 10 g/dL — ABNORMAL LOW (ref 12.0–16.0)
Lymphocytes Relative: 61 %
Lymphs Abs: 4.1 10*3/uL — ABNORMAL HIGH (ref 1.0–3.6)
MCH: 33.4 pg (ref 26.0–34.0)
MCHC: 34.2 g/dL (ref 32.0–36.0)
MCV: 97.8 fL (ref 80.0–100.0)
Monocytes Absolute: 0.2 10*3/uL (ref 0.2–0.9)
Monocytes Relative: 4 %
NEUTROS PCT: 32 %
Neutro Abs: 2.1 10*3/uL (ref 1.4–6.5)
PLATELETS: 248 10*3/uL (ref 150–440)
RBC: 3 MIL/uL — AB (ref 3.80–5.20)
RDW: 16 % — ABNORMAL HIGH (ref 11.5–14.5)
WBC: 6.6 10*3/uL (ref 3.6–11.0)

## 2017-11-02 MED ORDER — DEXAMETHASONE 4 MG PO TABS: 40.0000 mg | ORAL_TABLET | ORAL | 0 refills | Status: DC

## 2017-11-02 MED ORDER — PANTOPRAZOLE SODIUM 40 MG PO TBEC: 40.0000 mg | DELAYED_RELEASE_TABLET | Freq: Every day | ORAL | 3 refills | Status: DC

## 2017-11-02 NOTE — Progress Notes (Signed)
[facility] NOTE  Patient Care Team: [facility] as PCP - General (General Practice)  CHIEF COMPLAINTS/PURPOSE OF CONSULTATION: Multiple myeloma Multiple myeloma.  #  Oncology History   # SEP 2018- MULTIPLE MYELOMA IgALamda [2.5 gm/dl; K/L= 88/1298]; STAGE III [beta 2 microglobulin=5.5] [presented with acute renal failure; anemia; NO hypercalcemia; Skeletal survey-Normal]; BMBx- 45% plasma cells; FISH-POSITIVE 11:14 translocation.[STANDARD-high RISK]/cyto-Normal; SEP 2018- PET- L3 posterior element lesion.   # 9/14- velcade SQ twice weekly/Dex 40 mg/week; OCT 5th 2018-Start R [60m]VD; 3cycles of RVD- PARTAL RESPONSE  # Jan 11th 2019-Dara-Rev-Dex   # Acute renal failure [Dr.Singh; Proteinuria 1.5gm/day ]; acyclovir/Asprin     Multiple myeloma in relapse (HCC)     HISTORY OF PRESENTING ILLNESS:  Brandi BARRITT89y.[id number].  female multiple myeloma stage III transplant eligible currently on Dara-rev-Dex is here for follow-up.  In the interim patient had a bone marrow biopsy 2 days ago.  The results are pending.  Patient states that she has been feeling the best.  Left hip pain is improved.  No nausea no vomiting.  Patient's last Revlimid was on 3/21.  There was some concerns of "slightly rising M protein"-[facility] is recommended to get one more dara infusion.  ROS: A complete 10 point review of system is done which is negative except mentioned above in history of present illness  MEDICAL HISTORY:  Past Medical History:  Diagnosis Date  . Hypertension   . [place]     SURGICAL HISTORY: History reviewed. No pertinent surgical history.  SOCIAL HISTORY:  [place] daughter [place] close [place]. She smokes about 4-5 cigarettes a day. No alcohol. She used to work in a [employer] Currently retired.  Social History   Socioeconomic History  . Marital status: Single    Spouse  name: Not on file  . Number of children: Not on file  . Years of education: Not on file  . Highest education level: Not on file  Occupational History  . Not on file  Social Needs  . Financial resource strain: Not on file  . Food insecurity:    Worry: Not on file    Inability: Not on file  . Transportation needs:    Medical: Not on file    Non-medical: Not on file  Tobacco Use  . Smoking status: Current Every Day Smoker    Packs/day: 0.50    Types: Cigarettes  . Smokeless tobacco: Never Used  Substance and Sexual Activity  . Alcohol use: No  . Drug use: No  . Sexual activity: Not on file  Lifestyle  . Physical activity:    Days per week: Not on file    Minutes per session: Not on file  . Stress: Not on file  Relationships  . Social connections:    Talks on phone: Not on file    Gets together: Not on file    Attends religious service: Not on file    Active member of club or organization: Not on file    Attends meetings of clubs or organizations: Not on file    Relationship status: Not on file  . Intimate partner violence:    Fear of current or ex partner: Not on file    Emotionally abused: Not on file    Physically abused: Not on file    Forced sexual activity: Not on file  Other Topics Concern  . Not  on file  Social History Narrative   Lives at home with family. Independent at baseline    FAMILY HISTORY: Family History  Problem Relation Age of Onset  . Pneumonia Mother   . Seizures Father     ALLERGIES:  has No Known Allergies.  MEDICATIONS:  Current Outpatient Medications  Medication Sig Dispense Refill  . acetaminophen (TYLENOL) 500 MG tablet Take 1 tablet (500 mg total) by mouth every 6 (six) hours as needed (TAKE 1 TABLET DAILY X 4 DAYS-STARTING 2 DAYS PRIOR TO CHEMOTHERAPY.). (Patient taking differently: Take 1,000 mg by mouth every 6 (six) hours as needed (TAKE 1 TABLET DAILY X 4 DAYS-STARTING 2 DAYS PRIOR TO CHEMOTHERAPY.). ) 30 tablet 0  . acyclovir  (ZOVIRAX) 400 MG tablet One pill a day [to prevent shingles] 30 tablet 6  . amLODipine (NORVASC) 10 MG tablet Take 1 tablet (10 mg total) by mouth daily. 30 tablet 4  . aspirin 325 MG tablet Take 1 tablet by mouth daily.    . feeding supplement, ENSURE ENLIVE, (ENSURE ENLIVE) LIQD Take 237 mLs by mouth 2 (two) times daily between meals. 60 Bottle 0  . loratadine (CLARITIN) 10 MG tablet Take 1 tablet (10 mg total) by mouth daily. Take daily x 4 days (start 2 days prior to chemo) 30 tablet 3  . montelukast (SINGULAIR) 10 MG tablet Take 1 tablet (10 mg total) by mouth at bedtime. Start 2 days prior to chemo. Take for 4 days total 30 tablet 3  . potassium chloride SA (K-DUR,KLOR-CON) 20 MEQ tablet Take 1 tablet (20 mEq total) by mouth 2 (two) times daily. 60 tablet 3  . dexamethasone (DECADRON) 4 MG tablet Take 10 tablets (40 mg total) by mouth once a week. 10 pills in AM with food. 10 tablet 0  . HYDROcodone-acetaminophen (NORCO) 5-325 MG tablet Take 1 tablet by mouth every 8 (eight) hours as needed for moderate pain. 60 tablet 0  . pantoprazole (PROTONIX) 40 MG tablet Take 1 tablet (40 mg total) by mouth daily. 30 tablet 3  . polyethylene glycol (MIRALAX / GLYCOLAX) packet Take 17 g by mouth daily as needed for mild constipation. (Patient not taking: Reported on 11/02/2017) 14 each 0  . ranitidine (ZANTAC) 150 MG tablet Take 1 tablet (150 mg total) by mouth 2 (two) times daily. START TAKING 2 DAYS PRIOR TO CHEMOTHERAPY. TAKE FOR A TOTAL OF 4 DAYS (Patient not taking: Reported on 11/02/2017)    . REVLIMID 20 MG capsule TAKE 1 CAPSULE BY MOUTH EVERY DAY FOR 14 DAYS (Patient not taking: Reported on 11/02/2017) 14 capsule 0  . temazepam (RESTORIL) 7.5 MG capsule Take 1 capsule (7.5 mg total) by mouth at bedtime. (Patient not taking: Reported on 11/02/2017) 30 capsule 0   No current facility-administered medications for this visit.       [name]  PHYSICAL EXAMINATION: ECOG PERFORMANCE STATUS: 1 - Symptomatic  but completely ambulatory  Vitals:   11/02/17 1402  BP: 137/86  Pulse: 63  Resp: 20  Temp: 97.9 F (36.6 C)   Filed Weights   11/02/17 1402  Weight: 158 lb 3.2 oz (71.8 kg)    GENERAL: Well-nourished well-developed; Alert, no distress and comfortable. She is a alone. EYES: no pallor or icterus OROPHARYNX: no thrush or ulceration; good dentition  NECK: supple, no masses felt LYMPH:  no palpable lymphadenopathy in the cervical, axillary or inguinal regions LUNGS: clear to auscultation and  No wheeze or crackles HEART/CVS: regular rate & rhythm and no murmurs;  No lower extremity edema ABDOMEN: abdomen soft, non-tender and normal bowel sounds Musculoskeletal:no cyanosis of digits and no clubbing  PSYCH: alert & oriented x 3 with fluent speech NEURO: no focal motor/sensory deficits SKIN: Faint rash on her torso/   LABORATORY DATA:  I have reviewed the data as listed Lab Results  Component Value Date   WBC 6.6 11/02/2017   HGB 10.0 (L) 11/02/2017   HCT 29.4 (L) 11/02/2017   MCV 97.8 11/02/2017   PLT 248 11/02/2017   Recent Labs    09/30/17 0808 10/06/17 0837 10/19/17 1425 11/02/17 1342  NA 139 138 139 137  K 3.7 3.9 4.2 3.6  CL 109 102 108 108  CO2 23 26 20* 20*  GLUCOSE 101* 99 106* 99  BUN '14 17 17 13  ' CREATININE 0.62 0.75 0.83 0.84  CALCIUM 8.7* 9.3 8.8* 9.4  GFRNONAA >60 >60 >60 >60  GFRAA >60 >60 >60 >60  PROT 8.1  --  8.2* 8.4*  ALBUMIN 3.3*  --  3.7 3.6  AST 23  --  20 23  ALT 25  --  61* 16  ALKPHOS 60  --  62 59  BILITOT 0.2*  --  0.4 0.4    RADIOGRAPHIC STUDIES: I have personally reviewed the radiological images as listed and agreed with the findings in the report. Ct Biopsy  Result Date: 10/31/2017 INDICATION: Myeloma EXAM: CT GUIDED RIGHT ILIAC BONE MARROW ASPIRATION AND CORE BIOPSY Date:  10/31/2017 10/31/2017 9:34 am Radiologist:  M. Daryll Brod, MD Guidance:  CT FLUOROSCOPY TIME:  Fluoroscopy Time: None. MEDICATIONS: None. ANESTHESIA/SEDATION:  Moderate Sedation Time:  None. The patient was continuously monitored during the procedure by the interventional radiology nurse under my direct supervision. CONTRAST:  None. COMPLICATIONS: None PROCEDURE: Informed consent was obtained from the patient following explanation of the procedure, risks, benefits and alternatives. The patient understands, agrees and consents for the procedure. All questions were addressed. A time out was performed. The patient was positioned prone and non-contrast localization CT was performed of the pelvis to demonstrate the iliac marrow spaces. Maximal barrier sterile technique utilized including caps, mask, sterile gowns, sterile gloves, large sterile drape, hand hygiene, and Betadine prep. Under sterile conditions and local anesthesia, an 11 gauge coaxial bone biopsy needle was advanced into the right iliac marrow space. Needle position was confirmed with CT imaging. Initially, bone marrow aspiration was performed. Next, the 11 gauge outer cannula was utilized to obtain a right iliac bone marrow core biopsy. Needle was removed. Hemostasis was obtained with compression. The patient tolerated the procedure well. Samples were prepared with the cytotechnologist. No immediate complications. IMPRESSION: CT guided right iliac bone marrow aspiration and core biopsy. Electronically Signed   By: Jerilynn Mages.  Shick M.D.   On: 10/31/2017 09:36   IMPRESSION: 1. There is an FDG avid lytic lesion in the right posterior element of L3 consistent with myelomatous involvement. More mild uptake without CT correlate in L5 is nonspecific. Recommend attention on follow-up. No other bony involvement identified. 2. Atherosclerotic changes seen in the aorta. Coronary artery calcifications. 3. Nodular uptake in the skin and subcutaneous tissues of the right pelvis, likely an injection site. Recommend clinical correlation to exclude a skin lesion. Aortic Atherosclerosis (ICD10-I70.0).   Electronically  Signed   By: Dorise Bullion III M.D   On: 04/21/2017 15:09  --------------------------  Results for Brandi [name], Brandi [name] (MRN [id number]) as of 10/06/2017 09:36  Ref. Range 04/05/2017 22:43 04/06/2017 14:12 04/10/2017 11:17 04/29/2017 10:10 04/29/2017 10:16 05/26/2017  08:30 06/23/2017 09:28 07/22/2017 11:40 09/01/2017 08:58 09/01/2017 09:06 09/30/2017 08:01 09/30/2017 08:08  M Protein SerPl Elph-Mcnc Latest Ref Range: Not Observed g/dL    3.2 (H)  2.9 (H) 2.2 (H) 2.4 (H) 2.4 (H)  1.0 (H)      Results for Brandi [name], Brandi [name] (MRN [id number]) as of 10/06/2017 09:36  Ref. Range 04/05/2017 22:43 04/06/2017 14:12 04/10/2017 11:17 04/29/2017 10:10 04/29/2017 10:16 05/26/2017 08:30 06/23/2017 09:28 07/22/2017 11:40 09/01/2017 08:58 09/01/2017 09:06 09/30/2017 08:01 09/30/2017 08:08  Kappa free light chain Latest Ref Range: 3.3 - 19.4 mg/L  88.6 (H)   56.9 (H) 38.6 (H) 33.7 (H) 23.0 (H)  29.2 (H)  24.6 (H)  Lamda free light chains Latest Ref Range: 5.7 - 26.3 mg/L  1,298.8 (H)   828.9 (H) 944.2 (H) 739.8 (H) 556.6 (H)  391.3 (H)  391.3 (H)  Kappa, lamda light chain ratio Latest Ref Range: 0.26 - 1.65   0.07 (L)   0.07 (L) 0.04 (L) 0.05 (L) 0.04 (L)  0.07 (L)  0.06 (L)        ASSESSMENT & PLAN:   Multiple myeloma in relapse (HCC) # Multiple myeloma-stage III. Transplant eligible- IgA lambda [sep 5994]; cytogenetics/fish- 11:14-STANDARD/? High RISK]; pretreatment-M protein 3.7; lambda light chain 1298.   # Currently on second line therapy with Rev-dara-dex [jan 11th]-given partial response to RVD; interruption sec to infections [bronchiis/pneumonia].  March 2019-M protein 1.0 g; lambda light chains 391. [Patient of note had multiple interruptions of her Revlimid therapy also].  Repeat myeloma labs from today pending.  Repeat bone marrow biopsy pending.  # On Rev 20 mg 2 w-ON & 1 w-OFF; [LAST DOSE 10/13/2017] continue dex 40 mg weekly; proceed with Dara # 10  on 4/15 [as per communication with Debbie from [place]  and will STOP  further infusions- pending upcoming transplant.   #Right hip pain-MRI shows lesion-significant improvement in pain on daratumumab.   # Skeletal lesion- on  X [place]- proceed today ca- 8.6 recommend ca+vit D; X-geva 3/15.  Again plan on 4/15.   # Infectious prophylaxis- continue acyclovir/ DVT prohylaxis- on asprin 325 mg/day.   # follow up 2 week/labs; NO TREATMENT.  4/15- dara+x-geva.   All questions were answered. The patient knows to call the clinic with any problems, questions or concerns.    [name], MD 11/02/2017 4:04 PM

## 2017-11-02 NOTE — Assessment & Plan Note (Addendum)
#  Multiple myeloma-stage III. Transplant eligible- IgA lambda [sep 3846]; cytogenetics/fish- 11:14-STANDARD/? High RISK]; pretreatment-M protein 3.7; lambda light chain 1298.   # Currently on second line therapy with Rev-dara-dex [jan 11th]-given partial response to RVD; interruption sec to infections [bronchiis/pneumonia].  March 2019-M protein 1.0 g; lambda light chains 391. [Patient of note had multiple interruptions of her Revlimid therapy also].  Repeat myeloma labs from today pending.  Repeat bone marrow biopsy pending.  # On Rev 20 mg 2 w-ON & 1 w-OFF; [LAST DOSE 10/13/2017] continue dex 40 mg weekly; proceed with Dara # 10  on 4/15 [as per communication with Debbie from [place]  and will STOP further infusions- pending upcoming transplant.   #Right hip pain-MRI shows lesion-significant improvement in pain on daratumumab.   # Skeletal lesion- on  X [place]- proceed today ca- 8.6 recommend ca+vit D; X-geva 3/15.  Again plan on 4/15.   # Infectious prophylaxis- continue acyclovir/ DVT prohylaxis- on asprin 325 mg/day.   # follow up 2 week/labs; NO TREATMENT.  4/15- dara+x-geva.

## 2017-11-03 ENCOUNTER — Telehealth: Payer: Self-pay | Admitting: Internal Medicine

## 2017-11-03 LAB — KAPPA/LAMBDA LIGHT CHAINS
KAPPA FREE LGHT CHN: 27.4 mg/L — AB (ref 3.3–19.4)
KAPPA, LAMDA LIGHT CHAIN RATIO: 0.06 — AB (ref 0.26–1.65)
LAMDA FREE LIGHT CHAINS: 492.3 mg/L — AB (ref 5.7–26.3)

## 2017-11-03 NOTE — Telephone Encounter (Signed)
I spoke to Dr. Juanetta Snow for Dr.Smir; Hem-path]-review bone marrow biopsy April 2019 of plasma cells by CD 138/by IHC- ~80% [currently reported on aspirate ~63%]  Review of previous bone marrow biopsy September 2018-plasma cells by CD138-IHC 85% ; by aspirate ~45%].   Given the suboptimal response-discussed with [facility]; Dr.Gasperreto; recommends-holding stem cell transplant plan at this time;  switching the treatments to Cytoxan/carfilzomib/Dex.   Await myeloma panel drawn yesterday [4/10]-proceed with data infusion on 415 as planned; will likely change the treatment plan once myeloma panel is back.  I spoke to patient regarding bone marrow biopsy results; hold transplant at this time. Cont dara infusion for now as planned on 4/15.   FYI

## 2017-11-03 NOTE — Telephone Encounter (Signed)
Bone marrow results have been faxed to Dr. Noah Delaine office at fax number [phone]

## 2017-11-03 NOTE — Telephone Encounter (Signed)
H/B- Please fax a copy of the bone marrow report to Dr.Gasperatto's office/you can reach her coordinator at [phone].  I left a message for Dr. Tresa Moore pathology-; Dr.Gasperrato/and Geanie Logan discuss bone marrow findings.

## 2017-11-07 ENCOUNTER — Other Ambulatory Visit: Payer: Self-pay | Admitting: Internal Medicine

## 2017-11-07 ENCOUNTER — Inpatient Hospital Stay: Payer: Medicare HMO

## 2017-11-07 VITALS — BP 117/73 | HR 64 | Temp 96.8°F | Resp 20 | Wt 162.8 lb

## 2017-11-07 DIAGNOSIS — C9002 Multiple myeloma in relapse: Secondary | ICD-10-CM

## 2017-11-07 DIAGNOSIS — Z5112 Encounter for antineoplastic immunotherapy: Secondary | ICD-10-CM | POA: Diagnosis not present

## 2017-11-07 LAB — MULTIPLE MYELOMA PANEL, SERUM
ALPHA 1: 0.2 g/dL (ref 0.0–0.4)
Albumin SerPl Elph-Mcnc: 3.5 g/dL (ref 2.9–4.4)
Albumin/Glob SerPl: 0.8 (ref 0.7–1.7)
Alpha2 Glob SerPl Elph-Mcnc: 0.8 g/dL (ref 0.4–1.0)
B-GLOBULIN SERPL ELPH-MCNC: 3.1 g/dL — AB (ref 0.7–1.3)
Gamma Glob SerPl Elph-Mcnc: 0.5 g/dL (ref 0.4–1.8)
Globulin, Total: 4.5 g/dL — ABNORMAL HIGH (ref 2.2–3.9)
IGA: 1500 mg/dL — AB (ref 87–352)
IgG (Immunoglobin G), Serum: 619 mg/dL — ABNORMAL LOW (ref 700–1600)
IgM (Immunoglobulin M), Srm: 6 mg/dL — ABNORMAL LOW (ref 26–217)
M PROTEIN SERPL ELPH-MCNC: 1.2 g/dL — AB
TOTAL PROTEIN ELP: 8 g/dL (ref 6.0–8.5)

## 2017-11-07 MED ORDER — SODIUM CHLORIDE 0.9 % IV SOLN
Freq: Once | INTRAVENOUS | Status: AC
  Administered 2017-11-07: 09:00:00 via INTRAVENOUS
  Filled 2017-11-07: qty 1000

## 2017-11-07 MED ORDER — PROCHLORPERAZINE MALEATE 10 MG PO TABS
10.0000 mg | ORAL_TABLET | Freq: Once | ORAL | Status: AC
  Administered 2017-11-07: 10 mg via ORAL
  Filled 2017-11-07: qty 1

## 2017-11-07 MED ORDER — SODIUM CHLORIDE 0.9 % IV SOLN
1200.0000 mg | Freq: Once | INTRAVENOUS | Status: AC
  Administered 2017-11-07: 1200 mg via INTRAVENOUS
  Filled 2017-11-07: qty 60

## 2017-11-07 MED ORDER — SODIUM CHLORIDE 0.9 % IV SOLN
1000.0000 mg | Freq: Once | INTRAVENOUS | Status: DC
  Filled 2017-11-07: qty 50

## 2017-11-07 MED ORDER — DIPHENHYDRAMINE HCL 25 MG PO CAPS
50.0000 mg | ORAL_CAPSULE | Freq: Once | ORAL | Status: AC
  Administered 2017-11-07: 50 mg via ORAL
  Filled 2017-11-07: qty 2

## 2017-11-07 MED ORDER — METHYLPREDNISOLONE SODIUM SUCC 125 MG IJ SOLR
125.0000 mg | Freq: Once | INTRAMUSCULAR | Status: AC
  Administered 2017-11-07: 125 mg via INTRAVENOUS
  Filled 2017-11-07: qty 2

## 2017-11-07 MED ORDER — ACETAMINOPHEN 325 MG PO TABS
650.0000 mg | ORAL_TABLET | Freq: Once | ORAL | Status: AC
  Administered 2017-11-07: 650 mg via ORAL
  Filled 2017-11-07: qty 2

## 2017-11-08 ENCOUNTER — Other Ambulatory Visit: Payer: Self-pay | Admitting: Internal Medicine

## 2017-11-08 DIAGNOSIS — Z5181 Encounter for therapeutic drug level monitoring: Secondary | ICD-10-CM | POA: Insufficient documentation

## 2017-11-08 DIAGNOSIS — Z79899 Other long term (current) drug therapy: Secondary | ICD-10-CM | POA: Insufficient documentation

## 2017-11-08 DIAGNOSIS — C9002 Multiple myeloma in relapse: Secondary | ICD-10-CM

## 2017-11-08 NOTE — Progress Notes (Signed)
H/B- please inform patient that as per the recommendations from [facility]; will change the treatment to a different regimen with the next cycle [in approximately 2 weeks]; please have the patient keep appointment as planned on 4/25.  I will discuss with the patient in detail about the regimen at that time/ visit.  #Patient needs to have a 2D echo done ASAP/ordered.   # Please schedule the patient for KCD regimen [see treatment schedule]; starting on 29th April.

## 2017-11-09 NOTE — Progress Notes (Signed)
[name] - please Cytoxan/carfilzomib (Kyprolis) /Dex on 4/29

## 2017-11-09 NOTE — Progress Notes (Signed)
Voice mail box not set up. I attempted to reach patient. No answer.

## 2017-11-10 NOTE — Progress Notes (Signed)
Patient contacted by RN. I made pt aware of the plan of care. She is agreeable. Information provided on the echocardiogram apts.

## 2017-11-11 ENCOUNTER — Encounter (HOSPITAL_COMMUNITY): Payer: Self-pay | Admitting: Internal Medicine

## 2017-11-11 ENCOUNTER — Ambulatory Visit
Admission: RE | Admit: 2017-11-11 | Discharge: 2017-11-11 | Disposition: A | Discharge status: Home / Self Care | Payer: Medicare HMO | Source: Ambulatory Visit | Attending: Internal Medicine | Admitting: Internal Medicine

## 2017-11-11 DIAGNOSIS — I10 Essential (primary) hypertension: Secondary | ICD-10-CM | POA: Insufficient documentation

## 2017-11-11 DIAGNOSIS — Z5181 Encounter for therapeutic drug level monitoring: Secondary | ICD-10-CM | POA: Diagnosis not present

## 2017-11-11 DIAGNOSIS — Z79899 Other long term (current) drug therapy: Secondary | ICD-10-CM | POA: Diagnosis not present

## 2017-11-11 DIAGNOSIS — C9002 Multiple myeloma in relapse: Secondary | ICD-10-CM | POA: Diagnosis not present

## 2017-11-11 NOTE — Progress Notes (Signed)
*  PRELIMINARY RESULTS* Echocardiogram 2D Echocardiogram has been performed.  Brandi [name] 11/11/2017, 12:16 PM

## 2017-11-17 ENCOUNTER — Inpatient Hospital Stay: Payer: Medicare HMO | Admitting: *Deleted

## 2017-11-17 ENCOUNTER — Other Ambulatory Visit: Payer: Self-pay

## 2017-11-17 ENCOUNTER — Inpatient Hospital Stay (HOSPITAL_BASED_OUTPATIENT_CLINIC_OR_DEPARTMENT_OTHER): Payer: Medicare HMO | Admitting: Internal Medicine

## 2017-11-17 ENCOUNTER — Encounter: Payer: Self-pay | Admitting: Internal Medicine

## 2017-11-17 VITALS — BP 112/80 | HR 82 | Resp 16 | Wt 161.0 lb

## 2017-11-17 DIAGNOSIS — M899 Disorder of bone, unspecified: Secondary | ICD-10-CM | POA: Diagnosis not present

## 2017-11-17 DIAGNOSIS — M25551 Pain in right hip: Secondary | ICD-10-CM

## 2017-11-17 DIAGNOSIS — C9002 Multiple myeloma in relapse: Secondary | ICD-10-CM

## 2017-11-17 DIAGNOSIS — Z5112 Encounter for antineoplastic immunotherapy: Secondary | ICD-10-CM | POA: Diagnosis not present

## 2017-11-17 DIAGNOSIS — F17210 Nicotine dependence, cigarettes, uncomplicated: Secondary | ICD-10-CM | POA: Diagnosis not present

## 2017-11-17 DIAGNOSIS — R69 Illness, unspecified: Secondary | ICD-10-CM | POA: Diagnosis not present

## 2017-11-17 LAB — CBC WITH DIFFERENTIAL/PLATELET
BASOS ABS: 0 10*3/uL (ref 0–0.1)
Basophils Relative: 0 %
EOS ABS: 0.1 10*3/uL (ref 0–0.7)
EOS PCT: 2 %
HCT: 30.4 % — ABNORMAL LOW (ref 35.0–47.0)
Hemoglobin: 10.4 g/dL — ABNORMAL LOW (ref 12.0–16.0)
Lymphocytes Relative: 62 %
Lymphs Abs: 4.1 10*3/uL — ABNORMAL HIGH (ref 1.0–3.6)
MCH: 33.5 pg (ref 26.0–34.0)
MCHC: 34.3 g/dL (ref 32.0–36.0)
MCV: 97.7 fL (ref 80.0–100.0)
Monocytes Absolute: 0.4 10*3/uL (ref 0.2–0.9)
Monocytes Relative: 6 %
Neutro Abs: 2 10*3/uL (ref 1.4–6.5)
Neutrophils Relative %: 30 %
PLATELETS: 250 10*3/uL (ref 150–440)
RBC: 3.11 MIL/uL — AB (ref 3.80–5.20)
RDW: 15.4 % — ABNORMAL HIGH (ref 11.5–14.5)
WBC: 6.7 10*3/uL (ref 3.6–11.0)

## 2017-11-17 LAB — COMPREHENSIVE METABOLIC PANEL
ALT: 13 U/L — AB (ref 14–54)
AST: 21 U/L (ref 15–41)
Albumin: 3.5 g/dL (ref 3.5–5.0)
Alkaline Phosphatase: 50 U/L (ref 38–126)
Anion gap: 10 (ref 5–15)
BUN: 19 mg/dL (ref 6–20)
CO2: 22 mmol/L (ref 22–32)
CREATININE: 0.76 mg/dL (ref 0.44–1.00)
Calcium: 8.5 mg/dL — ABNORMAL LOW (ref 8.9–10.3)
Chloride: 108 mmol/L (ref 101–111)
GFR calc Af Amer: >60 mL/min (ref >60)
Glucose, Bld: 106 mg/dL — ABNORMAL HIGH (ref 65–99)
Potassium: 3.6 mmol/L (ref 3.5–5.1)
Sodium: 140 mmol/L (ref 135–145)
TOTAL PROTEIN: 8.4 g/dL — AB (ref 6.5–8.1)
Total Bilirubin: 0.2 mg/dL — ABNORMAL LOW (ref 0.3–1.2)

## 2017-11-17 LAB — TISSUE HYBRIDIZATION (BONE MARROW)-NCBH

## 2017-11-17 LAB — CHROMOSOME ANALYSIS, BONE MARROW

## 2017-11-17 LAB — BRAIN NATRIURETIC PEPTIDE: B Natriuretic Peptide: 64 pg/mL (ref 0.0–100.0)

## 2017-11-17 LAB — TROPONIN I

## 2017-11-17 MED ORDER — DEXAMETHASONE 4 MG PO TABS: 40.0000 mg | ORAL_TABLET | ORAL | 2 refills | Status: DC

## 2017-11-17 NOTE — Progress Notes (Signed)
[place] NOTE  Patient Care Team: Center, [facility] as PCP - General (General Practice)  CHIEF COMPLAINTS/PURPOSE OF CONSULTATION: Multiple myeloma Multiple myeloma.  #  Oncology History   # SEP 2018- MULTIPLE MYELOMA IgALamda [2.5 gm/dl; K/L= 88/1298]; STAGE III [beta 2 microglobulin=5.5] [presented with acute renal failure; anemia; NO hypercalcemia; Skeletal survey-Normal]; BMBx- 45% plasma cells; FISH-POSITIVE 11:14 translocation.[STANDARD-high RISK]/cyto-Normal; SEP 2018- PET- L3 posterior element lesion.   # 9/14- velcade SQ twice weekly/Dex 40 mg/week; OCT 5th 2018-Start R [17m]VD; 3cycles of RVD- PARTAL RESPONSE  # Jan 11th 2019-Dara-Rev-Dex; April 2019- BMBx- plasma cell -by CD-138/IHC-80% [baseline Sep 2018- 85% ]; HOLD transplant [dw Dr.Gasperatto]  # May 2019- carfil-Cyt-Dex  # Acute renal failure [Dr.Singh; Proteinuria 1.5gm/day ]; acyclovir/Asprin     Multiple myeloma in relapse ([place]   11/07/2017 -  Chemotherapy    The patient had palonosetron (ALOXI) injection 0.25 mg, 0.25 mg, Intravenous,  Once, 1 of 4 cycles Administration: 0.25 mg (11/21/2017), 0.25 mg (11/29/2017) cyclophosphamide (CYTOXAN) 500 mg in sodium chloride 0.9 % 250 mL chemo infusion, 540 mg, Intravenous,  Once, 1 of 4 cycles Administration: 500 mg (11/21/2017), 500 mg (11/29/2017) carfilzomib (KYPROLIS) 36 mg in dextrose 5 % 50 mL chemo infusion, 20 mg/m2 = 36 mg, Intravenous, Once, 1 of 4 cycles Administration: 36 mg (11/21/2017), 36 mg (11/22/2017), 60 mg (11/29/2017), 60 mg (11/30/2017)  for chemotherapy treatment.         HISTORY OF PRESENTING ILLNESS:  Brandi SPRANGER89y.o.  female multiple myeloma stage III transplant eligible currently on Dara-rev-Dex is here for follow-up/review the results of the bone marrow biopsy.  In the interim patient had a bone marrow biopsy 2 days ago-denies any significant posterior complications.  Patient states that she has been  feeling the best.  Left hip pain is improved.  No nausea no vomiting.  Patient's last Revlimid was on 3/21.  ROS: A complete 10 point review of system is done which is negative except mentioned above in history of present illness  MEDICAL HISTORY:  Past Medical History:  Diagnosis Date  . Hypertension   . [place]     SURGICAL HISTORY: History reviewed. No pertinent surgical history.  SOCIAL HISTORY:  [place] Her daughter lives close by. She smokes about 4-5 cigarettes a day. No alcohol. She used to work in a [employer] Currently retired.  Social History   Socioeconomic History  . Marital status: Single    Spouse name: Not on file  . Number of children: Not on file  . Years of education: Not on file  . Highest education level: Not on file  Occupational History  . Not on file  Social Needs  . Financial resource strain: Not on file  . Food insecurity:    Worry: Not on file    Inability: Not on file  . Transportation needs:    Medical: Not on file    Non-medical: Not on file  Tobacco Use  . Smoking status: Current Every Day Smoker    Packs/day: 0.50    Types: Cigarettes  . Smokeless tobacco: Never Used  Substance and Sexual Activity  . Alcohol use: No  . Drug use: No  . Sexual activity: Not on file  Lifestyle  . Physical activity:    Days per week: Not on file    Minutes per session: Not on file  . Stress: Not on file  Relationships  . Social connections:  Talks on phone: Not on file    Gets together: Not on file    Attends religious service: Not on file    Active member of club or organization: Not on file    Attends meetings of clubs or organizations: Not on file    Relationship status: Not on file  . Intimate partner violence:    Fear of current or ex partner: Not on file    Emotionally abused: Not on file    Physically abused: Not on file    Forced sexual activity: Not on file  Other Topics Concern  . Not on file   Social History Narrative   Lives at home with family. Independent at baseline    FAMILY HISTORY: Family History  Problem Relation Age of Onset  . Pneumonia Mother   . Seizures Father     ALLERGIES:  has No Known Allergies.  MEDICATIONS:  Current Outpatient Medications  Medication Sig Dispense Refill  . acetaminophen (TYLENOL) 500 MG tablet Take 1 tablet (500 mg total) by mouth every 6 (six) hours as needed (TAKE 1 TABLET DAILY X 4 DAYS-STARTING 2 DAYS PRIOR TO CHEMOTHERAPY.). (Patient taking differently: Take 1,000 mg by mouth every 6 (six) hours as needed (TAKE 1 TABLET DAILY X 4 DAYS-STARTING 2 DAYS PRIOR TO CHEMOTHERAPY.). ) 30 tablet 0  . acyclovir (ZOVIRAX) 400 MG tablet One pill a day [to prevent shingles] 30 tablet 6  . amLODipine (NORVASC) 10 MG tablet Take 1 tablet (10 mg total) by mouth daily. 30 tablet 4  . aspirin 325 MG tablet Take 1 tablet by mouth daily.    [name] dexamethasone (DECADRON) 4 MG tablet Take 10 tablets (40 mg total) by mouth once a week. 10 pills in AM with food. 80 tablet 2  . feeding supplement, ENSURE ENLIVE, (ENSURE ENLIVE) LIQD Take 237 mLs by mouth 2 (two) times daily between meals. 60 Bottle 0  . HYDROcodone-acetaminophen (NORCO) 5-325 MG tablet Take 1 tablet by mouth every 8 (eight) hours as needed for moderate pain. 60 tablet 0  . loratadine (CLARITIN) 10 MG tablet Take 1 tablet (10 mg total) by mouth daily. Take daily x 4 days (start 2 days prior to chemo) 30 tablet 3  . montelukast (SINGULAIR) 10 MG tablet Take 1 tablet (10 mg total) by mouth at bedtime. Start 2 days prior to chemo. Take for 4 days total 30 tablet 3  . pantoprazole (PROTONIX) 40 MG tablet Take 1 tablet (40 mg total) by mouth daily. 30 tablet 3  . polyethylene glycol (MIRALAX / GLYCOLAX) packet Take 17 g by mouth daily as needed for mild constipation. 14 each 0  . potassium chloride SA (K-DUR,KLOR-CON) 20 MEQ tablet Take 1 tablet (20 mEq total) by mouth 2 (two) times daily. 60 tablet 3   . ranitidine (ZANTAC) 150 MG tablet Take 1 tablet (150 mg total) by mouth 2 (two) times daily. START TAKING 2 DAYS PRIOR TO CHEMOTHERAPY. TAKE FOR A TOTAL OF 4 DAYS    . REVLIMID 20 MG capsule TAKE 1 CAPSULE BY MOUTH EVERY DAY FOR 14 DAYS 14 capsule 0  . temazepam (RESTORIL) 7.5 MG capsule Take 1 capsule (7.5 mg total) by mouth at bedtime. 30 capsule 0   No current facility-administered medications for this visit.       [name]  PHYSICAL EXAMINATION: ECOG PERFORMANCE STATUS: 1 - Symptomatic but completely ambulatory  Vitals:   11/17/17 1000  BP: 112/80  Pulse: 82  Resp: 16   Filed Weights  11/17/17 1000  Weight: 161 lb (73 kg)    GENERAL: Well-nourished well-developed; Alert, no distress and comfortable. She is a alone. EYES: no pallor or icterus OROPHARYNX: no thrush or ulceration; good dentition  NECK: supple, no masses felt LYMPH:  no palpable lymphadenopathy in the cervical, axillary or inguinal regions LUNGS: clear to auscultation and  No wheeze or crackles HEART/CVS: regular rate & rhythm and no murmurs; No lower extremity edema ABDOMEN: abdomen soft, non-tender and normal bowel sounds Musculoskeletal:no cyanosis of digits and no clubbing  PSYCH: alert & oriented x 3 with fluent speech NEURO: no focal motor/sensory deficits SKIN: Faint rash on her torso/   LABORATORY DATA:  I have reviewed the data as listed Lab Results  Component Value Date   WBC 6.7 11/29/2017   HGB 10.6 (L) 11/29/2017   HCT 31.0 (L) 11/29/2017   MCV 96.5 11/29/2017   PLT 213 11/29/2017   Recent Labs    11/02/17 1342 11/17/17 0926 11/29/17 0940  NA 137 140 139  K 3.6 3.6 4.1  CL 108 108 106  CO2 20* 22 21*  GLUCOSE 99 106* 103*  BUN '13 19 17  ' CREATININE 0.84 0.76 0.80  CALCIUM 9.4 8.5* 9.0  GFRNONAA >60 >60 >60  GFRAA >60 >60 >60  PROT 8.4* 8.4* 8.4*  ALBUMIN 3.6 3.5 3.8  AST '23 21 29  ' ALT 16 13* 11*  ALKPHOS 59 50 58  BILITOT 0.4 0.2* 0.5    RADIOGRAPHIC STUDIES: I have  personally reviewed the radiological images as listed and agreed with the findings in the report. No results found. IMPRESSION: 1. There is an FDG avid lytic lesion in the right posterior element of L3 consistent with myelomatous involvement. More mild uptake without CT correlate in L5 is nonspecific. Recommend attention on follow-up. No other bony involvement identified. 2. Atherosclerotic changes seen in the aorta. Coronary artery calcifications. 3. Nodular uptake in the skin and subcutaneous tissues of the right pelvis, likely an injection site. Recommend clinical correlation to exclude a skin lesion. Aortic Atherosclerosis (ICD10-I70.0).   Electronically Signed   By: Dorise Bullion III M.D   On: 04/21/2017 15:09  --------------------------  Results for ANNJANETTE, WERTENBERGER (MRN [id number]) as of 10/06/2017 09:36  Ref. Range 04/05/2017 22:43 04/06/2017 14:12 04/10/2017 11:17 04/29/2017 10:10 04/29/2017 10:16 05/26/2017 08:30 06/23/2017 09:28 07/22/2017 11:40 09/01/2017 08:58 09/01/2017 09:06 09/30/2017 08:01 09/30/2017 08:08  M Protein SerPl Elph-Mcnc Latest Ref Range: Not Observed g/dL    3.2 (H)  2.9 (H) 2.2 (H) 2.4 (H) 2.4 (H)  1.0 (H)      Results for ESTHEFANY, HERRIG (MRN [id number]) as of 10/06/2017 09:36  Ref. Range 04/05/2017 22:43 04/06/2017 14:12 04/10/2017 11:17 04/29/2017 10:10 04/29/2017 10:16 05/26/2017 08:30 06/23/2017 09:28 07/22/2017 11:40 09/01/2017 08:58 09/01/2017 09:06 09/30/2017 08:01 09/30/2017 08:08  Kappa free light chain Latest Ref Range: 3.3 - 19.4 mg/L  88.6 (H)   56.9 (H) 38.6 (H) 33.7 (H) 23.0 (H)  29.2 (H)  24.6 (H)  Lamda free light chains Latest Ref Range: 5.7 - 26.3 mg/L  1,298.8 (H)   828.9 (H) 944.2 (H) 739.8 (H) 556.6 (H)  391.3 (H)  391.3 (H)  Kappa, lamda light chain ratio Latest Ref Range: 0.26 - 1.65   0.07 (L)   0.07 (L) 0.04 (L) 0.05 (L) 0.04 (L)  0.07 (L)  0.06 (L)        ASSESSMENT & PLAN:   Multiple myeloma in relapse (HCC) # Multiple myeloma-stage III.  Transplant eligible- IgA  lambda [sep 4301]; cytogenetics/fish- 11:14-STANDARD/? High RISK]; pretreatment-M protein 3.7; lambda light chain 1298.   #Poor response to Revlimid-dara-dex-with no significant improvement of the plasma cells on bone marrow biopsy [80 to 85% plasma cells on repeat bone marrow biopsy].  Recommend switching therapy to Kyprolis-Cytoxa-Dex.  2D echo within normal limits.  #Right hip pain-MRI shows lesion-significant improvement in pain on daratumumab.   # Skeletal lesion- on  X Geva- proceed today ca- 8.6 recommend ca+vit D; X-geva 3/15.  Again plan on 4/15.   # Infectious prophylaxis- continue acyclovir/ DVT prohylaxis- on asprin 325 mg/day.   # follow up 2 week/labs; Carfil; 4/29- get pro-BNP/troponin.   All questions were answered. The patient knows to call the clinic with any problems, questions or concerns.    [name], MD 12/04/2017 8:11 PM

## 2017-11-17 NOTE — Assessment & Plan Note (Addendum)
#  Multiple myeloma-stage III. Transplant eligible- IgA lambda [sep 0300]; cytogenetics/fish- 11:14-STANDARD/? High RISK]; pretreatment-M protein 3.7; lambda light chain 1298.   #Poor response to Revlimid-dara-dex-with no significant improvement of the plasma cells on bone marrow biopsy [80 to 85% plasma cells on repeat bone marrow biopsy].  Recommend switching therapy to Kyprolis-Cytoxa-Dex.  2D echo within normal limits.  #Right hip pain-MRI shows lesion-significant improvement in pain on daratumumab.   # Skeletal lesion- on  X Geva- proceed today ca- 8.6 recommend ca+vit D; X-geva 3/15.  Again plan on 4/15.   # Infectious prophylaxis- continue acyclovir/ DVT prohylaxis- on asprin 325 mg/day.   # follow up 2 week/labs; Carfil; 4/29- get pro-BNP/troponin.

## 2017-11-21 ENCOUNTER — Other Ambulatory Visit: Payer: Self-pay | Admitting: Internal Medicine

## 2017-11-21 ENCOUNTER — Inpatient Hospital Stay: Payer: Medicare HMO

## 2017-11-21 VITALS — BP 150/86 | HR 81 | Temp 98.2°F | Resp 17 | Wt 163.4 lb

## 2017-11-21 DIAGNOSIS — C9002 Multiple myeloma in relapse: Secondary | ICD-10-CM

## 2017-11-21 DIAGNOSIS — Z5112 Encounter for antineoplastic immunotherapy: Secondary | ICD-10-CM | POA: Diagnosis not present

## 2017-11-21 MED ORDER — SODIUM CHLORIDE 0.9 % IV SOLN: 10.0000 mg | Freq: Once | INTRAVENOUS | Status: DC

## 2017-11-21 MED ORDER — SODIUM CHLORIDE 0.9 % IV SOLN
500.0000 mg | Freq: Once | INTRAVENOUS | Status: AC
  Administered 2017-11-21: 500 mg via INTRAVENOUS
  Filled 2017-11-21: qty 25

## 2017-11-21 MED ORDER — DEXTROSE 5 % IV SOLN
20.0000 mg/m2 | Freq: Once | INTRAVENOUS | Status: AC
  Administered 2017-11-21: 36 mg via INTRAVENOUS
  Filled 2017-11-21: qty 18

## 2017-11-21 MED ORDER — SODIUM CHLORIDE 0.9 % IV SOLN
Freq: Once | INTRAVENOUS | Status: AC
  Administered 2017-11-21: 09:00:00 via INTRAVENOUS
  Filled 2017-11-21: qty 1000

## 2017-11-21 MED ORDER — DEXAMETHASONE SODIUM PHOSPHATE 10 MG/ML IJ SOLN
10.0000 mg | Freq: Once | INTRAMUSCULAR | Status: AC
  Administered 2017-11-21: 10 mg via INTRAVENOUS
  Filled 2017-11-21: qty 1

## 2017-11-21 MED ORDER — SODIUM CHLORIDE 0.9 % IV SOLN
Freq: Once | INTRAVENOUS | Status: AC
  Administered 2017-11-21: 10:00:00 via INTRAVENOUS
  Filled 2017-11-21: qty 1000

## 2017-11-21 MED ORDER — PALONOSETRON HCL INJECTION 0.25 MG/5ML
0.2500 mg | Freq: Once | INTRAVENOUS | Status: AC
  Administered 2017-11-21: 0.25 mg via INTRAVENOUS
  Filled 2017-11-21: qty 5

## 2017-11-21 NOTE — Progress Notes (Signed)
Pt states she did not take home decadron prior to reporting to clinic. Per Dr. Rogue Bussing proceed with treatment as scheduled, pt to take 32mg  (8tablets) once she is discharged home from clinic. Pt verbalizes understanding.  Pt stable at discharge.

## 2017-11-22 ENCOUNTER — Inpatient Hospital Stay: Payer: Medicare HMO

## 2017-11-22 VITALS — BP 123/72 | HR 76 | Temp 97.2°F | Resp 18

## 2017-11-22 DIAGNOSIS — C9002 Multiple myeloma in relapse: Secondary | ICD-10-CM

## 2017-11-22 DIAGNOSIS — Z5112 Encounter for antineoplastic immunotherapy: Secondary | ICD-10-CM | POA: Diagnosis not present

## 2017-11-22 MED ORDER — DEXAMETHASONE SODIUM PHOSPHATE 10 MG/ML IJ SOLN
10.0000 mg | Freq: Once | INTRAMUSCULAR | Status: AC
  Administered 2017-11-22: 10 mg via INTRAVENOUS
  Filled 2017-11-22: qty 1

## 2017-11-22 MED ORDER — SODIUM CHLORIDE 0.9 % IV SOLN
Freq: Once | INTRAVENOUS | Status: AC
  Administered 2017-11-22: 09:00:00 via INTRAVENOUS
  Filled 2017-11-22: qty 1000

## 2017-11-22 MED ORDER — DEXTROSE 5 % IV SOLN
20.0000 mg/m2 | Freq: Once | INTRAVENOUS | Status: AC
  Administered 2017-11-22: 36 mg via INTRAVENOUS
  Filled 2017-11-22: qty 18

## 2017-11-22 MED ORDER — SODIUM CHLORIDE 0.9 % IV SOLN
Freq: Once | INTRAVENOUS | Status: AC
  Administered 2017-11-22: 10:00:00 via INTRAVENOUS
  Filled 2017-11-22: qty 1000

## 2017-11-22 MED ORDER — SODIUM CHLORIDE 0.9% FLUSH
10.0000 mL | INTRAVENOUS | Status: DC | PRN
  Filled 2017-11-22: qty 10

## 2017-11-22 MED ORDER — DEXAMETHASONE SODIUM PHOSPHATE 100 MG/10ML IJ SOLN: 10.0000 mg | Freq: Once | INTRAMUSCULAR | Status: DC

## 2017-11-22 MED ORDER — DENOSUMAB 120 MG/1.7ML ~~LOC~~ SOLN
120.0000 mg | Freq: Once | SUBCUTANEOUS | Status: AC
  Administered 2017-11-22: 120 mg via SUBCUTANEOUS
  Filled 2017-11-22: qty 1.7

## 2017-11-28 ENCOUNTER — Inpatient Hospital Stay: Payer: Medicare HMO

## 2017-11-28 ENCOUNTER — Telehealth: Payer: Self-pay | Admitting: *Deleted

## 2017-11-28 ENCOUNTER — Other Ambulatory Visit: Payer: Self-pay | Admitting: *Deleted

## 2017-11-28 DIAGNOSIS — C9002 Multiple myeloma in relapse: Secondary | ICD-10-CM

## 2017-11-28 NOTE — Telephone Encounter (Signed)
I contacted patient's daughter, Varney Biles, and I was able to speak with the patient. Patient states she forgot about her appt. I spoke with [name] who was able to get patient rescheduled for tomorrow and Wednesday at 9:00 - and patient has been notified with these appts and verbalized understanding.

## 2017-11-28 NOTE — Telephone Encounter (Signed)
Call attempt x 2 to reach patient. She no showed for her chemotherapy today. Unable to leave vm. Phone just rings.

## 2017-11-28 NOTE — Telephone Encounter (Signed)
3rd call attempt 1530- no answer.

## 2017-11-29 ENCOUNTER — Inpatient Hospital Stay: Payer: Medicare HMO | Attending: Internal Medicine

## 2017-11-29 ENCOUNTER — Inpatient Hospital Stay: Payer: Medicare HMO

## 2017-11-29 VITALS — BP 136/77 | HR 73 | Temp 97.1°F | Resp 16

## 2017-11-29 DIAGNOSIS — C9002 Multiple myeloma in relapse: Secondary | ICD-10-CM

## 2017-11-29 DIAGNOSIS — C801 Malignant (primary) neoplasm, unspecified: Secondary | ICD-10-CM

## 2017-11-29 DIAGNOSIS — Z5112 Encounter for antineoplastic immunotherapy: Secondary | ICD-10-CM | POA: Diagnosis not present

## 2017-11-29 LAB — CBC WITH DIFFERENTIAL/PLATELET
BASOS ABS: 0 10*3/uL (ref 0–0.1)
Basophils Relative: 0 %
Eosinophils Absolute: 0.1 10*3/uL (ref 0–0.7)
Eosinophils Relative: 2 %
HEMATOCRIT: 31 % — AB (ref 35.0–47.0)
Hemoglobin: 10.6 g/dL — ABNORMAL LOW (ref 12.0–16.0)
Lymphocytes Relative: 55 %
Lymphs Abs: 3.7 10*3/uL — ABNORMAL HIGH (ref 1.0–3.6)
MCH: 33.2 pg (ref 26.0–34.0)
MCHC: 34.4 g/dL (ref 32.0–36.0)
MCV: 96.5 fL (ref 80.0–100.0)
MONO ABS: 0.3 10*3/uL (ref 0.2–0.9)
Monocytes Relative: 5 %
NEUTROS ABS: 2.6 10*3/uL (ref 1.4–6.5)
NEUTROS PCT: 38 %
Platelets: 213 10*3/uL (ref 150–440)
RBC: 3.21 MIL/uL — AB (ref 3.80–5.20)
RDW: 15.5 % — AB (ref 11.5–14.5)
WBC: 6.7 10*3/uL (ref 3.6–11.0)

## 2017-11-29 LAB — COMPREHENSIVE METABOLIC PANEL
ALT: 11 U/L — ABNORMAL LOW (ref 14–54)
ANION GAP: 12 (ref 5–15)
AST: 29 U/L (ref 15–41)
Albumin: 3.8 g/dL (ref 3.5–5.0)
Alkaline Phosphatase: 58 U/L (ref 38–126)
BILIRUBIN TOTAL: 0.5 mg/dL (ref 0.3–1.2)
BUN: 17 mg/dL (ref 6–20)
CHLORIDE: 106 mmol/L (ref 101–111)
CO2: 21 mmol/L — ABNORMAL LOW (ref 22–32)
Calcium: 9 mg/dL (ref 8.9–10.3)
Creatinine, Ser: 0.8 mg/dL (ref 0.44–1.00)
Glucose, Bld: 103 mg/dL — ABNORMAL HIGH (ref 65–99)
POTASSIUM: 4.1 mmol/L (ref 3.5–5.1)
Sodium: 139 mmol/L (ref 135–145)
TOTAL PROTEIN: 8.4 g/dL — AB (ref 6.5–8.1)

## 2017-11-29 MED ORDER — SODIUM CHLORIDE 0.9 % IV SOLN
Freq: Once | INTRAVENOUS | Status: AC
  Administered 2017-11-29: 11:00:00 via INTRAVENOUS
  Filled 2017-11-29: qty 1000

## 2017-11-29 MED ORDER — SODIUM CHLORIDE 0.9 % IV SOLN: 10.0000 mg | Freq: Once | INTRAVENOUS | Status: DC

## 2017-11-29 MED ORDER — SODIUM CHLORIDE 0.9 % IV SOLN
Freq: Once | INTRAVENOUS | Status: AC
  Administered 2017-11-29: 10:00:00 via INTRAVENOUS
  Filled 2017-11-29: qty 1000

## 2017-11-29 MED ORDER — DEXTROSE 5 % IV SOLN
INTRAVENOUS | Status: DC
  Administered 2017-11-29: 11:00:00 via INTRAVENOUS
  Filled 2017-11-29: qty 1000

## 2017-11-29 MED ORDER — SODIUM CHLORIDE 0.9 % IV SOLN
500.0000 mg | Freq: Once | INTRAVENOUS | Status: AC
  Administered 2017-11-29: 500 mg via INTRAVENOUS
  Filled 2017-11-29: qty 25

## 2017-11-29 MED ORDER — PALONOSETRON HCL INJECTION 0.25 MG/5ML
0.2500 mg | Freq: Once | INTRAVENOUS | Status: AC
Start: 2017-11-29 — End: 2017-11-29
  Administered 2017-11-29: 0.25 mg via INTRAVENOUS
  Filled 2017-11-29: qty 5

## 2017-11-29 MED ORDER — DEXTROSE 5 % IV SOLN
60.0000 mg | Freq: Once | INTRAVENOUS | Status: AC
  Administered 2017-11-29: 60 mg via INTRAVENOUS
  Filled 2017-11-29: qty 30

## 2017-11-29 MED ORDER — DEXAMETHASONE SODIUM PHOSPHATE 10 MG/ML IJ SOLN
10.0000 mg | Freq: Once | INTRAMUSCULAR | Status: AC
  Administered 2017-11-29: 10 mg via INTRAVENOUS
  Filled 2017-11-29 (×2): qty 1

## 2017-11-29 NOTE — Progress Notes (Signed)
Brandi [name] brought her dexamethasone and will take her prescription here.

## 2017-11-30 ENCOUNTER — Inpatient Hospital Stay: Payer: Medicare HMO

## 2017-11-30 VITALS — BP 148/80 | HR 83 | Temp 97.7°F | Resp 18

## 2017-11-30 DIAGNOSIS — C9002 Multiple myeloma in relapse: Secondary | ICD-10-CM | POA: Diagnosis not present

## 2017-11-30 DIAGNOSIS — Z5112 Encounter for antineoplastic immunotherapy: Secondary | ICD-10-CM | POA: Diagnosis not present

## 2017-11-30 MED ORDER — DEXTROSE 5 % IV SOLN
60.0000 mg | Freq: Once | INTRAVENOUS | Status: AC
  Administered 2017-11-30: 60 mg via INTRAVENOUS
  Filled 2017-11-30: qty 30

## 2017-11-30 MED ORDER — DEXAMETHASONE SODIUM PHOSPHATE 10 MG/ML IJ SOLN
10.0000 mg | Freq: Once | INTRAMUSCULAR | Status: AC
  Administered 2017-11-30: 10 mg via INTRAVENOUS
  Filled 2017-11-30: qty 1

## 2017-11-30 MED ORDER — SODIUM CHLORIDE 0.9 % IV SOLN: 10.0000 mg | Freq: Once | INTRAVENOUS | Status: DC

## 2017-11-30 MED ORDER — SODIUM CHLORIDE 0.9 % IV SOLN
Freq: Once | INTRAVENOUS | Status: AC
  Administered 2017-11-30: 10:00:00 via INTRAVENOUS
  Filled 2017-11-30: qty 1000

## 2017-12-05 ENCOUNTER — Ambulatory Visit
Admission: RE | Admit: 2017-12-05 | Discharge: 2017-12-05 | Disposition: A | Discharge status: Home / Self Care | Payer: Medicare HMO | Source: Ambulatory Visit | Attending: Internal Medicine | Admitting: Internal Medicine

## 2017-12-05 ENCOUNTER — Inpatient Hospital Stay: Payer: Medicare HMO

## 2017-12-05 ENCOUNTER — Other Ambulatory Visit: Payer: Self-pay | Admitting: Internal Medicine

## 2017-12-05 ENCOUNTER — Other Ambulatory Visit: Payer: Self-pay | Admitting: *Deleted

## 2017-12-05 ENCOUNTER — Inpatient Hospital Stay (HOSPITAL_BASED_OUTPATIENT_CLINIC_OR_DEPARTMENT_OTHER): Payer: Medicare HMO | Admitting: Internal Medicine

## 2017-12-05 VITALS — BP 143/81 | HR 70 | Temp 98.1°F | Resp 16 | Wt 163.4 lb

## 2017-12-05 DIAGNOSIS — M5442 Lumbago with sciatica, left side: Secondary | ICD-10-CM

## 2017-12-05 DIAGNOSIS — M7989 Other specified soft tissue disorders: Secondary | ICD-10-CM | POA: Diagnosis not present

## 2017-12-05 DIAGNOSIS — M25552 Pain in left hip: Secondary | ICD-10-CM | POA: Diagnosis not present

## 2017-12-05 DIAGNOSIS — F17210 Nicotine dependence, cigarettes, uncomplicated: Secondary | ICD-10-CM | POA: Diagnosis not present

## 2017-12-05 DIAGNOSIS — C9002 Multiple myeloma in relapse: Secondary | ICD-10-CM

## 2017-12-05 DIAGNOSIS — M160 Bilateral primary osteoarthritis of hip: Secondary | ICD-10-CM | POA: Insufficient documentation

## 2017-12-05 DIAGNOSIS — M47816 Spondylosis without myelopathy or radiculopathy, lumbar region: Secondary | ICD-10-CM | POA: Insufficient documentation

## 2017-12-05 DIAGNOSIS — Z452 Encounter for adjustment and management of vascular access device: Secondary | ICD-10-CM

## 2017-12-05 DIAGNOSIS — Z5112 Encounter for antineoplastic immunotherapy: Secondary | ICD-10-CM | POA: Diagnosis not present

## 2017-12-05 DIAGNOSIS — M545 Low back pain: Secondary | ICD-10-CM | POA: Diagnosis not present

## 2017-12-05 DIAGNOSIS — M899 Disorder of bone, unspecified: Secondary | ICD-10-CM | POA: Diagnosis not present

## 2017-12-05 DIAGNOSIS — R69 Illness, unspecified: Secondary | ICD-10-CM | POA: Diagnosis not present

## 2017-12-05 LAB — CBC WITH DIFFERENTIAL/PLATELET
BASOS PCT: 0 %
Basophils Absolute: 0 10*3/uL (ref 0–0.1)
EOS ABS: 0.1 10*3/uL (ref 0–0.7)
Eosinophils Relative: 1 %
HCT: 30.7 % — ABNORMAL LOW (ref 35.0–47.0)
HEMOGLOBIN: 10.6 g/dL — AB (ref 12.0–16.0)
LYMPHS ABS: 1.9 10*3/uL (ref 1.0–3.6)
Lymphocytes Relative: 39 %
MCH: 33.2 pg (ref 26.0–34.0)
MCHC: 34.7 g/dL (ref 32.0–36.0)
MCV: 95.7 fL (ref 80.0–100.0)
Monocytes Absolute: 0.3 10*3/uL (ref 0.2–0.9)
Monocytes Relative: 5 %
NEUTROS PCT: 55 %
Neutro Abs: 2.7 10*3/uL (ref 1.4–6.5)
PLATELETS: 209 10*3/uL (ref 150–440)
RBC: 3.21 MIL/uL — ABNORMAL LOW (ref 3.80–5.20)
RDW: 15.2 % — ABNORMAL HIGH (ref 11.5–14.5)
WBC: 5 10*3/uL (ref 3.6–11.0)

## 2017-12-05 LAB — COMPREHENSIVE METABOLIC PANEL
ALT: 24 U/L (ref 14–54)
AST: 26 U/L (ref 15–41)
Albumin: 3.5 g/dL (ref 3.5–5.0)
Alkaline Phosphatase: 53 U/L (ref 38–126)
Anion gap: 11 (ref 5–15)
BILIRUBIN TOTAL: 0.5 mg/dL (ref 0.3–1.2)
BUN: 20 mg/dL (ref 6–20)
CHLORIDE: 107 mmol/L (ref 101–111)
CO2: 22 mmol/L (ref 22–32)
CREATININE: 0.85 mg/dL (ref 0.44–1.00)
Calcium: 9.1 mg/dL (ref 8.9–10.3)
GFR calc Af Amer: >60 mL/min (ref >60)
Glucose, Bld: 106 mg/dL — ABNORMAL HIGH (ref 65–99)
Potassium: 3.9 mmol/L (ref 3.5–5.1)
Sodium: 140 mmol/L (ref 135–145)
Total Protein: 7.3 g/dL (ref 6.5–8.1)

## 2017-12-05 MED ORDER — SODIUM CHLORIDE 0.9 % IV SOLN: 10.0000 mg | Freq: Once | INTRAVENOUS | Status: DC

## 2017-12-05 MED ORDER — DENOSUMAB 120 MG/1.7ML ~~LOC~~ SOLN: 120.0000 mg | Freq: Once | SUBCUTANEOUS | Status: DC

## 2017-12-05 MED ORDER — SODIUM CHLORIDE 0.9 % IV SOLN
500.0000 mg | Freq: Once | INTRAVENOUS | Status: AC
  Administered 2017-12-05: 500 mg via INTRAVENOUS
  Filled 2017-12-05: qty 25

## 2017-12-05 MED ORDER — SODIUM CHLORIDE 0.9 % IV SOLN
Freq: Once | INTRAVENOUS | Status: AC
  Administered 2017-12-05: 11:00:00 via INTRAVENOUS
  Filled 2017-12-05: qty 1000

## 2017-12-05 MED ORDER — SODIUM CHLORIDE 0.9 % IV SOLN
Freq: Once | INTRAVENOUS | Status: AC
  Administered 2017-12-05: 10:00:00 via INTRAVENOUS
  Filled 2017-12-05: qty 1000

## 2017-12-05 MED ORDER — DEXTROSE 5 % IV SOLN
60.0000 mg | Freq: Once | INTRAVENOUS | Status: AC
  Administered 2017-12-05: 60 mg via INTRAVENOUS
  Filled 2017-12-05: qty 30

## 2017-12-05 MED ORDER — PALONOSETRON HCL INJECTION 0.25 MG/5ML
0.2500 mg | Freq: Once | INTRAVENOUS | Status: AC
  Administered 2017-12-05: 0.25 mg via INTRAVENOUS
  Filled 2017-12-05: qty 5

## 2017-12-05 MED ORDER — DEXAMETHASONE SODIUM PHOSPHATE 10 MG/ML IJ SOLN
10.0000 mg | Freq: Once | INTRAMUSCULAR | Status: AC
  Administered 2017-12-05: 10 mg via INTRAVENOUS
  Filled 2017-12-05: qty 1

## 2017-12-05 NOTE — Assessment & Plan Note (Addendum)
#  Multiple myeloma-stage III. Transplant eligible- IgA lambda [sep 1980]; cytogenetics/fish- 11:14-STANDARD/? High RISK]; on currently on third line therapy with Kyprolis-Cytoxan-Dex  # proceed with treatment today. Labs today reviewed;  acceptable for treatment today.   #Left hip pain/lower back pain-radiation to the left leg/5 on a scale of 10; recommend x-rays.  # Skeletal lesion- on  X Geva- proceed today ca- 8.6 recommend ca+vit D; last 4/15; plan 5/14.   # Infectious prophylaxis- continue acyclovir/ DVT prohylaxis- on asprin 325 mg/day.   # follow up 29/30; MD;labs- myeloma panel; Carfil-cytoxan; x-rays.

## 2017-12-05 NOTE — Patient Instructions (Signed)
#  Take 10 pills next week of dexamethasone/on a Monday  #On the weeks of chemotherapy take 8 pills/weekly

## 2017-12-05 NOTE — Progress Notes (Signed)
[place] OFFICE PROGRESS NOTE  Patient Care Team: Center, [facility] as PCP - General (General Practice)  Cancer Staging No matching staging information was found for the patient.   Oncology History   # SEP 2018- MULTIPLE MYELOMA IgALamda [2.5 gm/dl; K/L= 88/1298]; STAGE III [beta 2 microglobulin=5.5] [presented with acute renal failure; anemia; NO hypercalcemia; Skeletal survey-Normal]; BMBx- 45% plasma cells; FISH-POSITIVE 11:14 translocation.[STANDARD-high RISK]/cyto-Normal; SEP 2018- PET- L3 posterior element lesion.   # 9/14- velcade SQ twice weekly/Dex 40 mg/week; OCT 5th 2018-Start R [70m]VD; 3cycles of RVD- PARTAL RESPONSE  # Jan 11th 2019-Dara-Rev-Dex; April 2019- BMBx- plasma cell -by CD-138/IHC-80% [baseline Sep 2018- 85% ]; HOLD transplant [dw Dr.Gasperatto]  # May 2019- carfil-Cyt-Dex  # Acute renal failure [Dr.Singh; Proteinuria 1.5gm/day ]; acyclovir/Asprin     Multiple myel[place] relapse ([place]   11/07/2017 -  Chemotherapy    The patient had palonosetron (ALOXI) injection 0.25 mg, 0.25 mg, Intravenous,  Once, 1 of 4 cycles Administration: 0.25 mg (11/21/2017), 0.25 mg (11/29/2017) cyclophosphamide (CYTOXAN) 500 mg in sodium chloride 0.9 % 250 mL chemo infusion, 540 mg, Intravenous,  Once, 1 of 4 cycles Administration: 500 mg (11/21/2017), 500 mg (11/29/2017) carfilzomib (KYPROLIS) 36 mg in dextrose 5 % 50 mL chemo infusion, 20 mg/m2 = 36 mg, Intravenous, Once, 1 of 4 cycles Administration: 36 mg (11/21/2017), 36 mg (11/22/2017), 60 mg (11/29/2017), 60 mg (11/30/2017)  for chemotherapy treatment.          INTERVAL HISTORY:  Brandi [id number].  female pleasant patient above history of multiple myeloma /transplant eligible; currently on third line therapy with Cytoxan-Kyprolis-Dex is here for follow-up.  Patient complains of left hip pain radiating to the left lower quadrant of the abdomen/leg.  Also complains of pain in the lumbar area.  No  tingling or numbness.  Denies any shortness of breath or cough.  Mild swelling in the legs.  Review of Systems  Constitutional: Negative for chills, diaphoresis, fever, malaise/fatigue and weight loss.  HENT: Negative for nosebleeds and sore throat.   Eyes: Negative for double vision.  Respiratory: Negative for cough, hemoptysis, sputum production, shortness of breath and wheezing.   Cardiovascular: Positive for leg swelling. Negative for chest pain, palpitations and orthopnea.  Gastrointestinal: Negative for abdominal pain, blood in stool, constipation, diarrhea, heartburn, melena, nausea and vomiting.  Genitourinary: Negative for dysuria, frequency and urgency.  Musculoskeletal: Positive for back pain. Negative for joint pain.  Skin: Negative.  Negative for itching and rash.  Neurological: Negative for dizziness, tingling, focal weakness, weakness and headaches.  Endo/Heme/Allergies: Does not bruise/bleed easily.  Psychiatric/Behavioral: Negative for depression. The patient is not nervous/anxious and does not have insomnia.       PAST MEDICAL HISTORY :  Past Medical History:  Diagnosis Date  . Hypertension   . [place]     PAST SURGICAL HISTORY :  No past surgical history on file.  FAMILY HISTORY :   Family History  Problem Relation Age of Onset  . Pneumonia Mother   . Seizures Father     SOCIAL HISTORY:   Social History   Tobacco Use  . Smoking status: Current Every Day Smoker    Packs/day: 0.50    Types: Cigarettes  . Smokeless tobacco: Never Used  Substance Use Topics  . Alcohol use: No  . Drug use: No    ALLERGIES:  has No Known Allergies.  MEDICATIONS:  Current Outpatient Medications  Medication Sig Dispense Refill  .  acetaminophen (TYLENOL) 500 MG tablet Take 1 tablet (500 mg total) by mouth every 6 (six) hours as needed (TAKE 1 TABLET DAILY X 4 DAYS-STARTING 2 DAYS PRIOR TO CHEMOTHERAPY.). (Patient taking differently: Take 1,000 mg by mouth  every 6 (six) hours as needed (TAKE 1 TABLET DAILY X 4 DAYS-STARTING 2 DAYS PRIOR TO CHEMOTHERAPY.). ) 30 tablet 0  . acyclovir (ZOVIRAX) 400 MG tablet One pill a day [to prevent shingles] 30 tablet 6  . amLODipine (NORVASC) 10 MG tablet Take 1 tablet (10 mg total) by mouth daily. 30 tablet 4  . aspirin 325 MG tablet Take 1 tablet by mouth daily.    [name] dexamethasone (DECADRON) 4 MG tablet Take 10 tablets (40 mg total) by mouth once a week. 10 pills in AM with food. 80 tablet 2  . feeding supplement, ENSURE ENLIVE, (ENSURE ENLIVE) LIQD Take 237 mLs by mouth 2 (two) times daily between meals. 60 Bottle 0  . HYDROcodone-acetaminophen (NORCO) 5-325 MG tablet Take 1 tablet by mouth every 8 (eight) hours as needed for moderate pain. 60 tablet 0  . loratadine (CLARITIN) 10 MG tablet Take 1 tablet (10 mg total) by mouth daily. Take daily x 4 days (start 2 days prior to chemo) 30 tablet 3  . montelukast (SINGULAIR) 10 MG tablet Take 1 tablet (10 mg total) by mouth at bedtime. Start 2 days prior to chemo. Take for 4 days total 30 tablet 3  . pantoprazole (PROTONIX) 40 MG tablet Take 1 tablet (40 mg total) by mouth daily. 30 tablet 3  . polyethylene glycol (MIRALAX / GLYCOLAX) packet Take 17 g by mouth daily as needed for mild constipation. 14 each 0  . potassium chloride SA (K-DUR,KLOR-CON) 20 MEQ tablet Take 1 tablet (20 mEq total) by mouth 2 (two) times daily. 60 tablet 3  . ranitidine (ZANTAC) 150 MG tablet Take 1 tablet (150 mg total) by mouth 2 (two) times daily. START TAKING 2 DAYS PRIOR TO CHEMOTHERAPY. TAKE FOR A TOTAL OF 4 DAYS    . REVLIMID 20 MG capsule TAKE 1 CAPSULE BY MOUTH EVERY DAY FOR 14 DAYS 14 capsule 0  . temazepam (RESTORIL) 7.5 MG capsule Take 1 capsule (7.5 mg total) by mouth at bedtime. 30 capsule 0   No current facility-administered medications for this visit.    Facility-Administered Medications Ordered in Other Visits  Medication Dose Route Frequency Provider Last Rate Last Dose   . 0.9 %  sodium chloride infusion   Intravenous Once Charlaine Dalton R, MD      . 0.9 %  sodium chloride infusion   Intravenous Once [name], MD      . carfilzomib (KYPROLIS) 64 mg in dextrose 5 % 50 mL chemo infusion  36 mg/m2 (Treatment Plan Recorded) Intravenous Once [name], MD      . cyclophosphamide (CYTOXAN) 540 mg in sodium chloride 0.9 % 250 mL chemo infusion  300 mg/m2 (Treatment Plan Recorded) Intravenous Once [name], MD      . dexamethasone (DECADRON) 10 mg in sodium chloride 0.9 % 50 mL IVPB  10 mg Intravenous Once Charlaine Dalton R, MD      . palonosetron (ALOXI) injection 0.25 mg  0.25 mg Intravenous Once [name], MD        PHYSICAL EXAMINATION: ECOG PERFORMANCE STATUS: 1 - Symptomatic but completely ambulatory  BP (!) 143/81 (BP Location: Left Arm, Patient Position: Sitting)   Pulse 70   Temp 98.1 F (36.7 C) (Tympanic)  Resp 16   Wt 163 lb 6.4 oz (74.1 kg)   BMI 29.89 kg/m   Filed Weights   12/05/17 0900  Weight: 163 lb 6.4 oz (74.1 kg)    GENERAL: Well-nourished well-developed; Alert, no distress and comfortable.  Alone. EYES: no pallor or icterus OROPHARYNX: no thrush or ulceration; NECK: supple; no lymph nodes felt. LYMPH:  no palpable lymphadenopathy in the axillary or inguinal regions LUNGS: Decreased breath sounds auscultation bilaterally. No wheeze or crackles HEART/CVS: regular rate & rhythm and no murmurs; 1+ bilateral lower extremity edema ABDOMEN:abdomen soft, non-tender and normal bowel sounds. No hepatomegaly or splenomegaly.  Musculoskeletal:no cyanosis of digits and no clubbing; PSYCH: alert & oriented x 3 with fluent speech NEURO: no focal motor/sensory deficits SKIN:  no rashes or significant lesions    LABORATORY DATA:  I have reviewed the data as listed    Component Value Date/Time   NA 140 12/05/2017 0846   K 3.9 12/05/2017 0846   CL 107 12/05/2017 0846   CO2 22  12/05/2017 0846   GLUCOSE 106 (H) 12/05/2017 0846   BUN 20 12/05/2017 0846   CREATININE 0.85 12/05/2017 0846   CALCIUM 9.1 12/05/2017 0846   PROT 7.3 12/05/2017 0846   ALBUMIN 3.5 12/05/2017 0846   AST 26 12/05/2017 0846   ALT 24 12/05/2017 0846   ALKPHOS 53 12/05/2017 0846   BILITOT 0.5 12/05/2017 0846   GFRNONAA >60 12/05/2017 0846   GFRAA >60 12/05/2017 0846    No results found for: SPEP, UPEP  Lab Results  Component Value Date   WBC 5.0 12/05/2017   NEUTROABS 2.7 12/05/2017   HGB 10.6 (L) 12/05/2017   HCT 30.7 (L) 12/05/2017   MCV 95.7 12/05/2017   PLT 209 12/05/2017      Chemistry      Component Value Date/Time   NA 140 12/05/2017 0846   K 3.9 12/05/2017 0846   CL 107 12/05/2017 0846   CO2 22 12/05/2017 0846   BUN 20 12/05/2017 0846   CREATININE 0.85 12/05/2017 0846      Component Value Date/Time   CALCIUM 9.1 12/05/2017 0846   ALKPHOS 53 12/05/2017 0846   AST 26 12/05/2017 0846   ALT 24 12/05/2017 0846   BILITOT 0.5 12/05/2017 0846       RADIOGRAPHIC STUDIES: I have personally reviewed the radiological images as listed and agreed with the findings in the report. No results found.   ASSESSMENT & PLAN:  Multiple myeloma in relapse ([place]) # Multiple myeloma-stage III. Transplant eligible- IgA lambda [sep 1610]; cytogenetics/fish- 11:14-STANDARD/? High RISK]; on currently on third line therapy with Kyprolis-Cytoxan-Dex  # proceed with treatment today. Labs today reviewed;  acceptable for treatment today.   #Left hip pain/lower back pain-radiation to the left leg/5 on a scale of 10; recommend x-rays.  # Skeletal lesion- on  X [place]- proceed today ca- 8.6 recommend ca+vit D; last 4/15; plan 5/14.   # Infectious prophylaxis- continue acyclovir/ DVT prohylaxis- on asprin 325 mg/day.   # follow up 29/30; MD;labs- myeloma panel; Carfil-cytoxan; x-rays.    Orders Placed This Encounter  Procedures  . DG HIP UNILAT W OR W/O PELVIS 2-3 VIEWS LEFT    Standing  Status:   Future    Standing Expiration Date:   02/05/2019    Order Specific Question:   Reason for Exam (SYMPTOM  OR DIAGNOSIS REQUIRED)    Answer:   left hip pain    Order Specific Question:   Preferred imaging location?  Answer:   [facility]  . DG Lumbar Spine Complete    Standing Status:   Future    Standing Expiration Date:   12/05/2018    Order Specific Question:   Reason for Exam (SYMPTOM  OR DIAGNOSIS REQUIRED)    Answer:   low back pain    Order Specific Question:   Preferred imaging location?    Answer:   [facility]    Order Specific Question:   Radiology Contrast Protocol - do NOT remove file path    Answer:   [email or web address]  . CBC with Differential    Standing Status:   Future    Standing Expiration Date:   12/06/2018  . Comprehensive metabolic panel    Standing Status:   Future    Standing Expiration Date:   12/06/2018  . Multiple Myeloma Panel (SPEP&IFE w/QIG)    Standing Status:   Future    Standing Expiration Date:   12/06/2018  . Kappa/lambda light chains    Standing Status:   Future    Standing Expiration Date:   12/06/2018   All questions were answered. The patient knows to call the clinic with any problems, questions or concerns.      [name], MD 12/05/2017 10:17 AM

## 2017-12-06 ENCOUNTER — Inpatient Hospital Stay: Payer: Medicare HMO

## 2017-12-06 ENCOUNTER — Other Ambulatory Visit: Payer: Self-pay | Admitting: *Deleted

## 2017-12-06 VITALS — BP 141/82 | HR 59 | Temp 97.5°F | Resp 17

## 2017-12-06 DIAGNOSIS — C9002 Multiple myeloma in relapse: Secondary | ICD-10-CM | POA: Diagnosis not present

## 2017-12-06 DIAGNOSIS — Z5112 Encounter for antineoplastic immunotherapy: Secondary | ICD-10-CM | POA: Diagnosis not present

## 2017-12-06 MED ORDER — HYDROCODONE-ACETAMINOPHEN 5-325 MG PO TABS: 1.0000 | ORAL_TABLET | Freq: Three times a day (TID) | ORAL | 0 refills | Status: DC | PRN

## 2017-12-06 MED ORDER — SODIUM CHLORIDE 0.9 % IV SOLN
Freq: Once | INTRAVENOUS | Status: AC
  Administered 2017-12-06: 10:00:00 via INTRAVENOUS
  Filled 2017-12-06: qty 1000

## 2017-12-06 MED ORDER — DEXTROSE 5 % IV SOLN
60.0000 mg | Freq: Once | INTRAVENOUS | Status: AC
  Administered 2017-12-06: 60 mg via INTRAVENOUS
  Filled 2017-12-06: qty 30

## 2017-12-06 MED ORDER — DEXAMETHASONE SODIUM PHOSPHATE 10 MG/ML IJ SOLN
10.0000 mg | Freq: Once | INTRAMUSCULAR | Status: AC
  Administered 2017-12-06: 10 mg via INTRAVENOUS
  Filled 2017-12-06: qty 1

## 2017-12-06 MED ORDER — SODIUM CHLORIDE 0.9 % IV SOLN: 10.0000 mg | Freq: Once | INTRAVENOUS | Status: DC

## 2017-12-06 NOTE — Telephone Encounter (Signed)
Dr. Jacinto Reap- patient came for her apt in infusion today and asked to speak to me. She wanted to review her xray results. I explained to her that the lumbar xray demonstrated degenerative changes and hip xrays demonstrated bilateral arthritis.  She asked for a renewal of the norco. Dr. B B would you be willing to renew this. Thanks.

## 2017-12-06 NOTE — Telephone Encounter (Signed)
Script approved by Dr. Rogue Bussing

## 2017-12-06 NOTE — Progress Notes (Signed)
Per Renita Papa RN per Dr. Storm Frisk due at this time.

## 2017-12-07 ENCOUNTER — Telehealth: Payer: Self-pay | Admitting: *Deleted

## 2017-12-07 NOTE — Telephone Encounter (Signed)
Contacted patient to f/u on her pain. Patient states that the pain is much improved on the norco. She was also notified regarding her port placement and date and arrival details of this apt. Pt educated to be npo after midnight on the day of her port placement. Teach back process performed with patient.

## 2017-12-15 ENCOUNTER — Other Ambulatory Visit: Payer: Self-pay | Admitting: Student

## 2017-12-16 ENCOUNTER — Ambulatory Visit
Admission: RE | Admit: 2017-12-16 | Discharge: 2017-12-16 | Disposition: A | Discharge status: Home / Self Care | Payer: Medicare HMO | Source: Ambulatory Visit | Attending: Internal Medicine | Admitting: Internal Medicine

## 2017-12-16 ENCOUNTER — Telehealth: Payer: Self-pay | Admitting: *Deleted

## 2017-12-16 DIAGNOSIS — Z5111 Encounter for antineoplastic chemotherapy: Secondary | ICD-10-CM | POA: Diagnosis not present

## 2017-12-16 DIAGNOSIS — C9000 Multiple myeloma not having achieved remission: Secondary | ICD-10-CM | POA: Insufficient documentation

## 2017-12-16 DIAGNOSIS — F17210 Nicotine dependence, cigarettes, uncomplicated: Secondary | ICD-10-CM | POA: Diagnosis not present

## 2017-12-16 DIAGNOSIS — Z452 Encounter for adjustment and management of vascular access device: Secondary | ICD-10-CM

## 2017-12-16 DIAGNOSIS — I10 Essential (primary) hypertension: Secondary | ICD-10-CM | POA: Insufficient documentation

## 2017-12-16 DIAGNOSIS — Z7982 Long term (current) use of aspirin: Secondary | ICD-10-CM | POA: Diagnosis not present

## 2017-12-16 DIAGNOSIS — R69 Illness, unspecified: Secondary | ICD-10-CM | POA: Diagnosis not present

## 2017-12-16 HISTORY — PX: IR FLUORO GUIDE PORT INSERTION RIGHT: IMG5741

## 2017-12-16 LAB — CBC
HEMATOCRIT: 34.7 % — AB (ref 35.0–47.0)
Hemoglobin: 11.8 g/dL — ABNORMAL LOW (ref 12.0–16.0)
MCH: 32.9 pg (ref 26.0–34.0)
MCHC: 34 g/dL (ref 32.0–36.0)
MCV: 96.7 fL (ref 80.0–100.0)
PLATELETS: 367 10*3/uL (ref 150–440)
RBC: 3.59 MIL/uL — ABNORMAL LOW (ref 3.80–5.20)
RDW: 15.7 % — AB (ref 11.5–14.5)
WBC: 4.3 10*3/uL (ref 3.6–11.0)

## 2017-12-16 LAB — PROTIME-INR
INR: 0.86
PROTHROMBIN TIME: 11.6 s (ref 11.4–15.2)

## 2017-12-16 LAB — APTT: APTT: 28 s (ref 24–36)

## 2017-12-16 MED ORDER — HEPARIN (PORCINE) IN NACL 1000-0.9 UT/500ML-% IV SOLN
INTRAVENOUS | Status: AC
  Filled 2017-12-16: qty 500

## 2017-12-16 MED ORDER — LIDOCAINE HCL (PF) 1 % IJ SOLN
INTRAMUSCULAR | Status: AC | PRN
  Administered 2017-12-16: 20 mL

## 2017-12-16 MED ORDER — CEFAZOLIN SODIUM-DEXTROSE 2-4 GM/100ML-% IV SOLN
2.0000 g | INTRAVENOUS | Status: AC
  Administered 2017-12-16: 2 g via INTRAVENOUS

## 2017-12-16 MED ORDER — SODIUM CHLORIDE 0.9 % IV SOLN
INTRAVENOUS | Status: DC
  Administered 2017-12-16: 09:00:00 via INTRAVENOUS

## 2017-12-16 MED ORDER — LIDOCAINE HCL (PF) 1 % IJ SOLN
INTRAMUSCULAR | Status: AC
  Filled 2017-12-16: qty 30

## 2017-12-16 MED ORDER — FENTANYL CITRATE (PF) 100 MCG/2ML IJ SOLN
INTRAMUSCULAR | Status: AC | PRN
  Administered 2017-12-16: 50 ug via INTRAVENOUS
  Administered 2017-12-16: 25 ug via INTRAVENOUS

## 2017-12-16 MED ORDER — MIDAZOLAM HCL 5 MG/5ML IJ SOLN
INTRAMUSCULAR | Status: AC
  Filled 2017-12-16: qty 5

## 2017-12-16 MED ORDER — MIDAZOLAM HCL 5 MG/5ML IJ SOLN
INTRAMUSCULAR | Status: AC | PRN
  Administered 2017-12-16: 0.5 mg via INTRAVENOUS
  Administered 2017-12-16 (×2): 1 mg via INTRAVENOUS

## 2017-12-16 MED ORDER — LIDOCAINE-PRILOCAINE 2.5-2.5 % EX CREA: 1.0000 "application " | TOPICAL_CREAM | CUTANEOUS | 1 refills | Status: DC | PRN

## 2017-12-16 MED ORDER — FENTANYL CITRATE (PF) 100 MCG/2ML IJ SOLN
INTRAMUSCULAR | Status: AC
  Filled 2017-12-16: qty 4

## 2017-12-16 MED ORDER — LIDOCAINE HCL (PF) 1 % IJ SOLN
30.0000 mL | Freq: Once | INTRAMUSCULAR | Status: DC
  Filled 2017-12-16: qty 30

## 2017-12-16 MED ORDER — HEPARIN SOD (PORK) LOCK FLUSH 100 UNIT/ML IV SOLN
INTRAVENOUS | Status: AC
  Filled 2017-12-16: qty 5

## 2017-12-16 NOTE — Procedures (Signed)
Placement of right IJ port.  Tip at SVC/RA junction.  Minimal blood loss and no immediate complication.   

## 2017-12-16 NOTE — H&P (Signed)
Chief Complaint: Patient was seen in consultation today for Port-A-Cath placement at the request of Brandi [name]  Referring Physician(s): Brandi [name]  Patient Status: [facility] - Out-pt  History of Present Illness: Brandi [name] is a 67 y.o. female with multiple myeloma.  Patient has been receiving treatments but has poor venous access.  Request for a Port-A-Cath placement.  Currently, the patient has no significant complaints or symptoms.  Her only complaint is some hip pain.  Past Medical History:  Diagnosis Date  . Hypertension   . Multiple myeloma ([place])     History reviewed. No pertinent surgical history.  Allergies: Patient has no known allergies.  Medications: Prior to Admission medications   Medication Sig Start Date End Date Taking? Authorizing Provider  acyclovir (ZOVIRAX) 400 MG tablet One pill a day [to prevent shingles] 09/01/17  Yes [name], MD  amLODipine (NORVASC) 10 MG tablet Take 1 tablet (10 mg total) by mouth daily. 09/01/17  Yes [name], MD  aspirin 325 MG tablet Take 1 tablet by mouth daily.   Yes [provider]  feeding supplement, ENSURE ENLIVE, (ENSURE ENLIVE) LIQD Take 237 mLs by mouth 2 (two) times daily between meals. 04/12/17  Yes Gouru, Illene Silver, MD  HYDROcodone-acetaminophen (NORCO) 5-325 MG tablet Take 1 tablet by mouth every 8 (eight) hours as needed for moderate pain. 12/06/17  Yes [name], MD  loratadine (CLARITIN) 10 MG tablet Take 1 tablet (10 mg total) by mouth daily. Take daily x 4 days (start 2 days prior to chemo) 08/01/17  Yes [name], MD  montelukast (SINGULAIR) 10 MG tablet Take 1 tablet (10 mg total) by mouth at bedtime. Start 2 days prior to chemo. Take for 4 days total 08/01/17  Yes [name], MD  pantoprazole (PROTONIX) 40 MG tablet Take 1 tablet (40 mg total) by mouth daily. 11/02/17  Yes [name], MD  potassium chloride SA  (K-DUR,KLOR-CON) 20 MEQ tablet Take 1 tablet (20 mEq total) by mouth 2 (two) times daily. 09/20/17  Yes [name], MD  ranitidine (ZANTAC) 150 MG tablet Take 1 tablet (150 mg total) by mouth 2 (two) times daily. START TAKING 2 DAYS PRIOR TO CHEMOTHERAPY. TAKE FOR A TOTAL OF 4 DAYS 08/01/17  Yes [name], MD  acetaminophen (TYLENOL) 500 MG tablet Take 1 tablet (500 mg total) by mouth every 6 (six) hours as needed (TAKE 1 TABLET DAILY X 4 DAYS-STARTING 2 DAYS PRIOR TO CHEMOTHERAPY.). Patient taking differently: Take 1,000 mg by mouth every 6 (six) hours as needed (TAKE 1 TABLET DAILY X 4 DAYS-STARTING 2 DAYS PRIOR TO CHEMOTHERAPY.).  08/01/17   [name], MD  dexamethasone (DECADRON) 4 MG tablet Take 10 tablets (40 mg total) by mouth once a week. 10 pills in AM with food. 11/17/17   [name], MD  polyethylene glycol (MIRALAX / GLYCOLAX) packet Take 17 g by mouth daily as needed for mild constipation. 04/11/17   Gouru, Illene Silver, MD  REVLIMID 20 MG capsule TAKE 1 CAPSULE BY MOUTH EVERY DAY FOR 14 DAYS 09/12/17   [name], MD  temazepam (RESTORIL) 7.5 MG capsule Take 1 capsule (7.5 mg total) by mouth at bedtime. 04/11/17   Nicholes Mango, MD     Family History  Problem Relation Age of Onset  . Pneumonia Mother   . Seizures Father     Social History   Socioeconomic History  . Marital status: Single  Spouse name: Not on file  . Number of children: Not on file  . Years of education: Not on file  . Highest education level: Not on file  Occupational History  . Not on file  Social Needs  . Financial resource strain: Not on file  . Food insecurity:    Worry: Not on file    Inability: Not on file  . Transportation needs:    Medical: Not on file    Non-medical: Not on file  Tobacco Use  . Smoking status: Current Every Day Smoker    Packs/day: 0.50    Types: Cigarettes  . Smokeless tobacco: Never Used  Substance and Sexual Activity  .  Alcohol use: No  . Drug use: No  . Sexual activity: Not on file  Lifestyle  . Physical activity:    Days per week: Not on file    Minutes per session: Not on file  . Stress: Not on file  Relationships  . Social connections:    Talks on phone: Not on file    Gets together: Not on file    Attends religious service: Not on file    Active member of club or organization: Not on file    Attends meetings of clubs or organizations: Not on file    Relationship status: Not on file  Other Topics Concern  . Not on file  Social History Narrative   Lives at home with family. Independent at baseline    Review of Systems: A 12 point ROS discussed and pertinent positives are indicated in the HPI above.  All other systems are negative.  Review of Systems  Constitutional: Negative.   Respiratory: Negative.   Cardiovascular: Negative.   Gastrointestinal: Negative.     Vital Signs: BP 130/79   Pulse 79   Temp 97.6 F (36.4 C) (Oral)   Resp 17   Ht 5' 2" (1.575 m)   Wt 163 lb (73.9 kg)   SpO2 97%   BMI 29.81 kg/m   Physical Exam  Constitutional: No distress.  HENT:  Mouth/Throat: Oropharynx is clear and moist.  Cardiovascular: Normal rate, regular rhythm and normal heart sounds.  Pulmonary/Chest: Effort normal and breath sounds normal.  Abdominal: Soft. Bowel sounds are normal. She exhibits no distension. There is no tenderness.    Imaging: Dg Lumbar Spine Complete  Result Date: 12/05/2017 CLINICAL DATA:  History of myeloma with low back pain EXAM: LUMBAR SPINE - COMPLETE 4+ VIEW COMPARISON:  MRI 08/01/2016, PET-CT 04/21/2017 FINDINGS: Trace anterolisthesis L4 on L5. Vertebral body heights are maintained. Mild degenerative changes at L2-L3 and L4-L5. aortic atherosclerosis. Facet degenerative changes of the lower lumbar spine. IMPRESSION: Mild degenerative changes.  No acute osseous abnormality Electronically Signed   By: Kim  Fujinaga M.D.   On: 12/05/2017 19:58   Dg Hip Unilat W  Or W/o Pelvis 2-3 Views Left  Result Date: 12/05/2017 CLINICAL DATA:  History of multiple myeloma left-sided hip pain history of fall 3 weeks ago EXAM: DG HIP (WITH OR WITHOUT PELVIS) 2-3V LEFT COMPARISON:  MRI 08/01/2017, PET-CT 04/21/2017 FINDINGS: Pubic symphysis and rami are intact. No fracture or malalignment. No discrete lucent lesions are visualized. Mild SI joint degenerative changes. Mild arthritis of both hips. IMPRESSION: No acute osseous abnormality.  Mild arthritis of both hips. Electronically Signed   By: Kim  Fujinaga M.D.   On: 12/05/2017 19:56    Labs:  CBC: Recent Labs    11/17/17 0926 11/29/17 0918 12/05/17 0846 12/16/17 0700  WBC   6.7 6.7 5.0 4.3  HGB 10.4* 10.6* 10.6* 11.8*  HCT 30.4* 31.0* 30.7* 34.7*  PLT 250 213 209 367    COAGS: Recent Labs    04/07/17 1140 04/10/17 0623 10/31/17 0807  INR 1.09 1.11 0.87  APTT 38*  --   --     BMP: Recent Labs    11/02/17 1342 11/17/17 0926 11/29/17 0940 12/05/17 0846  NA 137 140 139 140  K 3.6 3.6 4.1 3.9  CL 108 108 106 107  CO2 20* 22 21* 22  GLUCOSE 99 106* 103* 106*  BUN _0 CALCIUM 9.4 8.5* 9.0 9.1  CREATININE 0.84 0.76 0.80 0.85  GFRNONAA >60 >60 >60 >60  GFRAA >60 >60 >60 >60    LIVER FUNCTION TESTS: Recent Labs    11/02/17 1342 11/17/17 0926 11/29/17 0940 12/05/17 0846  BILITOT 0.4 0.2* 0.5 0.5  AST _1 ALT 16 13* 11* 24  ALKPHOS 59 50 58 53  PROT 8.4* 8.4* 8.4* 7.3  ALBUMIN 3.6 3.5 3.8 3.5    TUMOR MARKERS: No results for input(s): AFPTM, CEA, CA199, CHROMGRNA in the last 8760 hours.  Assessment and Plan:  67 yo with multiple myeloma and needs a portacath for treatment.  Port placement discussed with patient.  Risks of bleeding, infection and port dysfunction discussed with patient.  Informed consent obtained from patient.  Plan for port placement with moderate sedation.    Thank you for this interesting consult.  I greatly enjoyed meeting AIDEE LATIMORE and  look forward to participating in their care.  A copy of this report was sent to the requesting provider on this date.  Electronically Signed: Burman Riis, MD 12/16/2017, 9:41 AM   I spent a total of  15 Minutes   in face to face in clinical consultation, greater than 50% of which was counseling/coordinating care for port placement.

## 2017-12-16 NOTE — Telephone Encounter (Signed)
emla cream Script faxed to pt's pharmacy

## 2017-12-16 NOTE — Telephone Encounter (Signed)
Port placed today, Patient needs EMLA cream prescription sent to [address]

## 2017-12-21 ENCOUNTER — Encounter: Payer: Self-pay | Admitting: Internal Medicine

## 2017-12-21 ENCOUNTER — Inpatient Hospital Stay (HOSPITAL_BASED_OUTPATIENT_CLINIC_OR_DEPARTMENT_OTHER): Payer: Medicare HMO | Admitting: Internal Medicine

## 2017-12-21 ENCOUNTER — Inpatient Hospital Stay: Payer: Medicare HMO

## 2017-12-21 VITALS — BP 130/83 | HR 65 | Temp 98.1°F | Resp 16 | Wt 165.0 lb

## 2017-12-21 DIAGNOSIS — R109 Unspecified abdominal pain: Secondary | ICD-10-CM | POA: Diagnosis not present

## 2017-12-21 DIAGNOSIS — M899 Disorder of bone, unspecified: Secondary | ICD-10-CM | POA: Diagnosis not present

## 2017-12-21 DIAGNOSIS — Z5112 Encounter for antineoplastic immunotherapy: Secondary | ICD-10-CM | POA: Diagnosis not present

## 2017-12-21 DIAGNOSIS — C9002 Multiple myeloma in relapse: Secondary | ICD-10-CM

## 2017-12-21 DIAGNOSIS — F17210 Nicotine dependence, cigarettes, uncomplicated: Secondary | ICD-10-CM | POA: Diagnosis not present

## 2017-12-21 DIAGNOSIS — R69 Illness, unspecified: Secondary | ICD-10-CM | POA: Diagnosis not present

## 2017-12-21 LAB — CBC WITH DIFFERENTIAL/PLATELET
BASOS ABS: 0 10*3/uL (ref 0–0.1)
Basophils Relative: 1 %
Eosinophils Absolute: 0.1 10*3/uL (ref 0–0.7)
Eosinophils Relative: 2 %
HEMATOCRIT: 31.8 % — AB (ref 35.0–47.0)
Hemoglobin: 11 g/dL — ABNORMAL LOW (ref 12.0–16.0)
Lymphocytes Relative: 41 %
Lymphs Abs: 2.1 10*3/uL (ref 1.0–3.6)
MCH: 33 pg (ref 26.0–34.0)
MCHC: 34.6 g/dL (ref 32.0–36.0)
MCV: 95.5 fL (ref 80.0–100.0)
MONO ABS: 0.4 10*3/uL (ref 0.2–0.9)
MONOS PCT: 9 %
NEUTROS ABS: 2.5 10*3/uL (ref 1.4–6.5)
Neutrophils Relative %: 47 %
Platelets: 297 10*3/uL (ref 150–440)
RBC: 3.33 MIL/uL — ABNORMAL LOW (ref 3.80–5.20)
RDW: 15.1 % — AB (ref 11.5–14.5)
WBC: 5.2 10*3/uL (ref 3.6–11.0)

## 2017-12-21 LAB — COMPREHENSIVE METABOLIC PANEL
ALBUMIN: 3.9 g/dL (ref 3.5–5.0)
ALT: 16 U/L (ref 14–54)
ANION GAP: 10 (ref 5–15)
AST: 19 U/L (ref 15–41)
Alkaline Phosphatase: 59 U/L (ref 38–126)
BUN: 18 mg/dL (ref 6–20)
CHLORIDE: 110 mmol/L (ref 101–111)
CO2: 19 mmol/L — AB (ref 22–32)
Calcium: 8.8 mg/dL — ABNORMAL LOW (ref 8.9–10.3)
Creatinine, Ser: 0.72 mg/dL (ref 0.44–1.00)
GFR calc Af Amer: >60 mL/min (ref >60)
GFR calc non Af Amer: >60 mL/min (ref >60)
GLUCOSE: 91 mg/dL (ref 65–99)
POTASSIUM: 3.8 mmol/L (ref 3.5–5.1)
SODIUM: 139 mmol/L (ref 135–145)
TOTAL PROTEIN: 6.9 g/dL (ref 6.5–8.1)
Total Bilirubin: 0.3 mg/dL (ref 0.3–1.2)

## 2017-12-21 MED ORDER — DEXTROSE 5 % IV SOLN
60.0000 mg | Freq: Once | INTRAVENOUS | Status: AC
  Administered 2017-12-21: 60 mg via INTRAVENOUS
  Filled 2017-12-21: qty 30

## 2017-12-21 MED ORDER — SODIUM CHLORIDE 0.9 % IV SOLN
Freq: Once | INTRAVENOUS | Status: AC
  Administered 2017-12-21: 11:00:00 via INTRAVENOUS
  Filled 2017-12-21: qty 1000

## 2017-12-21 MED ORDER — SODIUM CHLORIDE 0.9% FLUSH
10.0000 mL | INTRAVENOUS | Status: DC | PRN
  Administered 2017-12-21: 10 mL via INTRAVENOUS
  Filled 2017-12-21: qty 10

## 2017-12-21 MED ORDER — SODIUM CHLORIDE 0.9 % IV SOLN
500.0000 mg | Freq: Once | INTRAVENOUS | Status: AC
  Administered 2017-12-21: 500 mg via INTRAVENOUS
  Filled 2017-12-21: qty 25

## 2017-12-21 MED ORDER — SODIUM CHLORIDE 0.9 % IV SOLN: 300.0000 mg/m2 | Freq: Once | INTRAVENOUS | Status: DC

## 2017-12-21 MED ORDER — HEPARIN SOD (PORK) LOCK FLUSH 100 UNIT/ML IV SOLN
500.0000 [IU] | Freq: Once | INTRAVENOUS | Status: AC
  Administered 2017-12-21: 500 [IU] via INTRAVENOUS
  Filled 2017-12-21: qty 5

## 2017-12-21 MED ORDER — PALONOSETRON HCL INJECTION 0.25 MG/5ML
0.2500 mg | Freq: Once | INTRAVENOUS | Status: AC
  Administered 2017-12-21: 0.25 mg via INTRAVENOUS
  Filled 2017-12-21: qty 5

## 2017-12-21 MED ORDER — DEXAMETHASONE SODIUM PHOSPHATE 10 MG/ML IJ SOLN
10.0000 mg | Freq: Once | INTRAMUSCULAR | Status: AC
  Administered 2017-12-21: 10 mg via INTRAVENOUS
  Filled 2017-12-21: qty 1

## 2017-12-21 NOTE — Patient Instructions (Signed)
#  Take 5 pills of the dexamethasone; once a week/Fridays.

## 2017-12-21 NOTE — Progress Notes (Signed)
Patient c/o swelling in both feet. Patient states she elevated her feet when possible, which does relieve the swelling temporarily.

## 2017-12-21 NOTE — Assessment & Plan Note (Addendum)
#  Multiple myeloma stage III high-risk cytogenetics; transplant eligible.  On Kyprolis-Cytoxan dexamethasone.  Stable; myeloma protein pending.  #Proceed with treatment today; Labs today reviewed;  acceptable for treatment today.   # Skeletal lesion- on  X [place]- ca- 8.6 recommend ca+vit D; last 4/15; plan 5/14.   # Infectious prophylaxis- continue acyclovir/ DVT prohylaxis- on asprin 325 mg/day.   #Recent cough shortness of breath-acute bronchitis; currently resolved.  If symptoms get worse patient will call us we will get a chest x-ray.  # cbc/weekly; in 2 weeks/cbc/cmp; NP; in  4week/labs-MM panel; MD;carfil-cytoxan.

## 2017-12-21 NOTE — Progress Notes (Signed)
[place] OFFICE PROGRESS NOTE  Patient Care Team: Center, [facility] as PCP - General (General Practice)  Cancer Staging No matching staging information was found for the patient.   Oncology History   # SEP 2018- MULTIPLE MYELOMA IgALamda [2.5 gm/dl; K/L= 88/1298]; STAGE III [beta 2 microglobulin=5.5] [presented with acute renal failure; anemia; NO hypercalcemia; Skeletal survey-Normal]; BMBx- 45% plasma cells; FISH-POSITIVE 11:14 translocation.[STANDARD-high RISK]/cyto-Normal; SEP 2018- PET- L3 posterior element lesion.   # 9/14- velcade SQ twice weekly/Dex 40 mg/week; OCT 5th 2018-Start R [12m]VD; 3cycles of RVD- PARTAL RESPONSE  # Jan 11th 2019-Dara-Rev-Dex; April 2019- BMBx- plasma cell -by CD-138/IHC-80% [baseline Sep 2018- 85% ]; HOLD transplant [dw Dr.Gasperatto]  # May 2019- carfil-Cyt-Dex  # Acute renal failure [Dr.Singh; Proteinuria 1.5gm/day ]; acyclovir/Asprin -------------------------------------------------------------   DIAGNOSIS: '[ ]'  MULTIPLE MYELOMA  STAGE: III/HIGH RISK ;GOALS: CONTROL  CURRENT/MOST RECENT THERAPY [ MAY 2019- KYPROLIS-CYTOXAN-DEX      [place] ([place]   11/07/2017 -  Chemotherapy    The patient had palonosetron (ALOXI) injection 0.25 mg, 0.25 mg, Intravenous,  Once, 2 of 4 cycles Administration: 0.25 mg (11/21/2017), 0.25 mg (11/29/2017), 0.25 mg (12/05/2017), 0.25 mg (12/21/2017) cyclophosphamide (CYTOXAN) 500 mg in sodium chloride 0.9 % 250 mL chemo infusion, 540 mg, Intravenous,  Once, 2 of 4 cycles Administration: 500 mg (11/21/2017), 500 mg (11/29/2017), 500 mg (12/05/2017) carfilzomib (KYPROLIS) 36 mg in dextrose 5 % 50 mL chemo infusion, 20 mg/m2 = 36 mg, Intravenous, Once, 2 of 4 cycles Administration: 36 mg (11/21/2017), 36 mg (11/22/2017), 60 mg (11/29/2017), 60 mg (11/30/2017), 60 mg (12/05/2017), 60 mg (12/06/2017), 60 mg (12/21/2017), 60 mg (12/22/2017)  for chemotherapy treatment.          INTERVAL  HISTORY:  Brandi [place]89y.[id number].  Brandi [name] pleasant patient above history of multiple myeloma high risk currently on Kyprolis-Cytoxan dexamethasone.  Patient complains of mild abdominal pain about the pubic symphysis.  Intermittent; not significantly worse.  Current resolved.  Denies any pain.  No tingling or numbness.  No significant swelling in the legs.  Patient had a recent episode of shortness of breath and cough; currently resolved.  Denies any swelling in the legs.  Has any PND or orthopnea.  Review of Systems  Constitutional: Negative for chills, diaphoresis, fever, malaise/fatigue and weight loss.  HENT: Negative for nosebleeds and sore throat.   Eyes: Negative for double vision.  Respiratory: Positive for cough and shortness of breath. Negative for hemoptysis, sputum production and wheezing.   Cardiovascular: Negative for chest pain, palpitations, orthopnea and leg swelling.  Gastrointestinal: Positive for abdominal pain. Negative for blood in stool, constipation, diarrhea, heartburn, melena, nausea and vomiting.  Genitourinary: Negative for dysuria, frequency and urgency.  Musculoskeletal: Negative for back pain and joint pain.  Skin: Negative.  Negative for itching and rash.  Neurological: Negative for dizziness, tingling, focal weakness, weakness and headaches.  Endo/Heme/Allergies: Does not bruise/bleed easily.  Psychiatric/Behavioral: Negative for depression. The patient is not nervous/anxious and does not have insomnia.       PAST MEDICAL HISTORY :  Past Medical History:  Diagnosis Date  . Hypertension   . [place]     PAST SURGICAL HISTORY :   Past Surgical History:  Procedure Laterality Date  . IR FLUORO GUIDE PORT INSERTION RIGHT  12/16/2017    FAMILY HISTORY :   Family History  Problem Relation Age of Onset  . Pneumonia Mother   . Seizures Father  SOCIAL HISTORY:   Social History   Tobacco Use  . Smoking status: Current Every Day  Smoker    Packs/day: 0.50    Types: Cigarettes  . Smokeless tobacco: Never Used  Substance Use Topics  . Alcohol use: No  . Drug use: No    ALLERGIES:  has No Known Allergies.  MEDICATIONS:  Current Outpatient Medications  Medication Sig Dispense Refill  . acetaminophen (TYLENOL) 500 MG tablet Take 1 tablet (500 mg total) by mouth every 6 (six) hours as needed (TAKE 1 TABLET DAILY X 4 DAYS-STARTING 2 DAYS PRIOR TO CHEMOTHERAPY.). (Patient taking differently: Take 1,000 mg by mouth every 6 (six) hours as needed (TAKE 1 TABLET DAILY X 4 DAYS-STARTING 2 DAYS PRIOR TO CHEMOTHERAPY.). ) 30 tablet 0  . acyclovir (ZOVIRAX) 400 MG tablet One pill a day [to prevent shingles] 30 tablet 6  . amLODipine (NORVASC) 10 MG tablet Take 1 tablet (10 mg total) by mouth daily. 30 tablet 4  . aspirin 325 MG tablet Take 1 tablet by mouth daily.    [name] dexamethasone (DECADRON) 4 MG tablet Take 10 tablets (40 mg total) by mouth once a week. 10 pills in AM with food. 80 tablet 2  . feeding supplement, ENSURE ENLIVE, (ENSURE ENLIVE) LIQD Take 237 mLs by mouth 2 (two) times daily between meals. 60 Bottle 0  . HYDROcodone-acetaminophen (NORCO) 5-325 MG tablet Take 1 tablet by mouth every 8 (eight) hours as needed for moderate pain. 60 tablet 0  . lidocaine-prilocaine (EMLA) cream Apply 1 application topically as needed. Apply small amount to port site at least 1 hour prior to it being accessed, cover with plastic wrap 30 g 1  . loratadine (CLARITIN) 10 MG tablet Take 1 tablet (10 mg total) by mouth daily. Take daily x 4 days (start 2 days prior to chemo) 30 tablet 3  . montelukast (SINGULAIR) 10 MG tablet Take 1 tablet (10 mg total) by mouth at bedtime. Start 2 days prior to chemo. Take for 4 days total 30 tablet 3  . pantoprazole (PROTONIX) 40 MG tablet Take 1 tablet (40 mg total) by mouth daily. 30 tablet 3  . polyethylene glycol (MIRALAX / GLYCOLAX) packet Take 17 g by mouth daily as needed for mild constipation. 14  each 0  . potassium chloride SA (K-DUR,KLOR-CON) 20 MEQ tablet Take 1 tablet (20 mEq total) by mouth 2 (two) times daily. 60 tablet 3  . ranitidine (ZANTAC) 150 MG tablet Take 1 tablet (150 mg total) by mouth 2 (two) times daily. START TAKING 2 DAYS PRIOR TO CHEMOTHERAPY. TAKE FOR A TOTAL OF 4 DAYS    . REVLIMID 20 MG capsule TAKE 1 CAPSULE BY MOUTH EVERY DAY FOR 14 DAYS 14 capsule 0  . temazepam (RESTORIL) 7.5 MG capsule Take 1 capsule (7.5 mg total) by mouth at bedtime. 30 capsule 0   No current facility-administered medications for this visit.     PHYSICAL EXAMINATION: ECOG PERFORMANCE STATUS: 1 - Symptomatic but completely ambulatory  BP 130/83 (BP Location: Left Arm, Patient Position: Sitting)   Pulse 65   Temp 98.1 F (36.7 C) (Tympanic)   Resp 16   Wt 165 lb (74.8 kg)   BMI 30.18 kg/m   Filed Weights   12/21/17 0922 12/21/17 0925  Weight: 165 lb (74.8 kg) 165 lb (74.8 kg)    GENERAL: Well-nourished well-developed; Alert, no distress and comfortable.  Alone.  EYES: no pallor or icterus OROPHARYNX: no thrush or ulceration; NECK: supple; no lymph  nodes felt. LYMPH:  no palpable lymphadenopathy in the axillary or inguinal regions LUNGS: Decreased breath sounds auscultation bilaterally. No wheeze or crackles HEART/CVS: regular rate & rhythm and no murmurs; No lower extremity edema ABDOMEN:abdomen soft, non-tender and normal bowel sounds. No hepatomegaly or splenomegaly.  Musculoskeletal:no cyanosis of digits and no clubbing  PSYCH: alert & oriented x 3 with fluent speech NEURO: no focal motor/sensory deficits SKIN:  no rashes or significant lesions    LABORATORY DATA:  I have reviewed the data as listed    Component Value Date/Time   NA 139 12/21/2017 0913   K 3.8 12/21/2017 0913   CL 110 12/21/2017 0913   CO2 19 (L) 12/21/2017 0913   GLUCOSE 91 12/21/2017 0913   BUN 18 12/21/2017 0913   CREATININE 0.72 12/21/2017 0913   CALCIUM 8.8 (L) 12/21/2017 0913   PROT 6.9  12/21/2017 0913   ALBUMIN 3.9 12/21/2017 0913   AST 19 12/21/2017 0913   ALT 16 12/21/2017 0913   ALKPHOS 59 12/21/2017 0913   BILITOT 0.3 12/21/2017 0913   GFRNONAA >60 12/21/2017 0913   GFRAA >60 12/21/2017 0913    No results found for: SPEP, UPEP  Lab Results  Component Value Date   WBC 5.2 12/21/2017   NEUTROABS 2.5 12/21/2017   HGB 11.0 (L) 12/21/2017   HCT 31.8 (L) 12/21/2017   MCV 95.5 12/21/2017   PLT 297 12/21/2017      Chemistry      Component Value Date/Time   NA 139 12/21/2017 0913   K 3.8 12/21/2017 0913   CL 110 12/21/2017 0913   CO2 19 (L) 12/21/2017 0913   BUN 18 12/21/2017 0913   CREATININE 0.72 12/21/2017 0913      Component Value Date/Time   CALCIUM 8.8 (L) 12/21/2017 0913   ALKPHOS 59 12/21/2017 0913   AST 19 12/21/2017 0913   ALT 16 12/21/2017 0913   BILITOT 0.3 12/21/2017 0913       RADIOGRAPHIC STUDIES: I have personally reviewed the radiological images as listed and agreed with the findings in the report. No results found.   ASSESSMENT & PLAN:  Multiple myeloma in relapse ([place]) #Multiple myeloma stage III high-risk cytogenetics; transplant eligible.  On Kyprolis-Cytoxan dexamethasone.  Stable; myeloma protein pending.  #Proceed with treatment today; Labs today reviewed;  acceptable for treatment today.   # Skeletal lesion- on  X [place]- ca- 8.6 recommend ca+vit D; last 4/15; plan 5/14.   # Infectious prophylaxis- continue acyclovir/ DVT prohylaxis- on asprin 325 mg/day.   #Recent cough shortness of breath-acute bronchitis; currently resolved.  If symptoms get worse patient will call us we will get a chest x-ray.  # cbc/weekly; in 2 weeks/cbc/cmp; NP; in  4week/labs-MM panel; MD;carfil-cytoxan.    Orders Placed This Encounter  Procedures  . CBC with Differential/Platelet    Standing Status:   Future    Standing Expiration Date:   12/22/2018  . Comprehensive metabolic panel    Standing Status:   Future    Standing Expiration Date:    12/22/2018  . CBC with Differential/Platelet    Standing Status:   Future    Standing Expiration Date:   12/22/2018  . Comprehensive metabolic panel    Standing Status:   Future    Standing Expiration Date:   12/22/2018  . CBC with Differential/Platelet    Standing Status:   Future    Standing Expiration Date:   12/22/2018  . Comprehensive metabolic panel    Standing Status:  Future    Standing Expiration Date:   12/22/2018  . Multiple Myeloma Panel (SPEP&IFE w/QIG)    Standing Status:   Future    Standing Expiration Date:   12/22/2018  . Kappa/lambda light chains    Standing Status:   Future    Standing Expiration Date:   12/22/2018   All questions were answered. The patient knows to call the clinic with any problems, questions or concerns.      [name], MD 12/22/2017 7:16 PM

## 2017-12-22 ENCOUNTER — Telehealth: Payer: Self-pay | Admitting: Internal Medicine

## 2017-12-22 ENCOUNTER — Inpatient Hospital Stay: Payer: Medicare HMO

## 2017-12-22 VITALS — BP 120/73 | HR 71 | Temp 97.4°F | Resp 20

## 2017-12-22 DIAGNOSIS — Z5112 Encounter for antineoplastic immunotherapy: Secondary | ICD-10-CM | POA: Diagnosis not present

## 2017-12-22 DIAGNOSIS — C9002 Multiple myeloma in relapse: Secondary | ICD-10-CM

## 2017-12-22 LAB — KAPPA/LAMBDA LIGHT CHAINS
KAPPA FREE LGHT CHN: 6 mg/L (ref 3.3–19.4)
KAPPA, LAMDA LIGHT CHAIN RATIO: 0.08 — AB (ref 0.26–1.65)
Lambda free light chains: 71.2 mg/L — ABNORMAL HIGH (ref 5.7–26.3)

## 2017-12-22 MED ORDER — SODIUM CHLORIDE 0.9 % IV SOLN
Freq: Once | INTRAVENOUS | Status: AC
  Administered 2017-12-22: 10:00:00 via INTRAVENOUS
  Filled 2017-12-22: qty 1000

## 2017-12-22 MED ORDER — DEXTROSE 5 % IV SOLN
60.0000 mg | Freq: Once | INTRAVENOUS | Status: AC
  Administered 2017-12-22: 60 mg via INTRAVENOUS
  Filled 2017-12-22: qty 30

## 2017-12-22 MED ORDER — DEXAMETHASONE SODIUM PHOSPHATE 10 MG/ML IJ SOLN
10.0000 mg | Freq: Once | INTRAMUSCULAR | Status: AC
  Administered 2017-12-22: 10 mg via INTRAVENOUS
  Filled 2017-12-22: qty 1

## 2017-12-22 MED ORDER — SODIUM CHLORIDE 0.9% FLUSH
10.0000 mL | INTRAVENOUS | Status: DC | PRN
  Administered 2017-12-22: 10 mL
  Filled 2017-12-22: qty 10

## 2017-12-22 MED ORDER — HEPARIN SOD (PORK) LOCK FLUSH 100 UNIT/ML IV SOLN
500.0000 [IU] | Freq: Once | INTRAVENOUS | Status: AC | PRN
  Administered 2017-12-22: 500 [IU]
  Filled 2017-12-22: qty 5

## 2017-12-22 NOTE — Telephone Encounter (Signed)
Please also schedule the patient for Xgeva on 6/6; along with other infusions as scheduled.

## 2017-12-25 LAB — MULTIPLE MYELOMA PANEL, SERUM
ALBUMIN/GLOB SERPL: 1.3 (ref 0.7–1.7)
Albumin SerPl Elph-Mcnc: 3.6 g/dL (ref 2.9–4.4)
Alpha 1: 0.1 g/dL (ref 0.0–0.4)
Alpha2 Glob SerPl Elph-Mcnc: 0.7 g/dL (ref 0.4–1.0)
B-Globulin SerPl Elph-Mcnc: 1.5 g/dL — ABNORMAL HIGH (ref 0.7–1.3)
GAMMA GLOB SERPL ELPH-MCNC: 0.4 g/dL (ref 0.4–1.8)
GLOBULIN, TOTAL: 2.8 g/dL (ref 2.2–3.9)
IGA: 394 mg/dL — AB (ref 87–352)
IgG (Immunoglobin G), Serum: 500 mg/dL — ABNORMAL LOW (ref 700–1600)
IgM (Immunoglobulin M), Srm: 5 mg/dL — ABNORMAL LOW (ref 26–217)
M Protein SerPl Elph-Mcnc: 0.3 g/dL — ABNORMAL HIGH
Total Protein ELP: 6.4 g/dL (ref 6.0–8.5)

## 2017-12-28 ENCOUNTER — Telehealth: Payer: Self-pay | Admitting: Internal Medicine

## 2017-12-28 ENCOUNTER — Inpatient Hospital Stay: Payer: Medicare HMO

## 2017-12-28 ENCOUNTER — Inpatient Hospital Stay: Payer: Medicare HMO | Attending: Oncology

## 2017-12-28 ENCOUNTER — Ambulatory Visit
Admission: RE | Admit: 2017-12-28 | Discharge: 2017-12-28 | Disposition: A | Discharge status: Home / Self Care | Payer: Medicare HMO | Source: Ambulatory Visit | Attending: Internal Medicine | Admitting: Internal Medicine

## 2017-12-28 VITALS — BP 157/89 | HR 63 | Temp 97.5°F | Resp 20 | Wt 166.4 lb

## 2017-12-28 DIAGNOSIS — I700 Atherosclerosis of aorta: Secondary | ICD-10-CM | POA: Insufficient documentation

## 2017-12-28 DIAGNOSIS — R600 Localized edema: Secondary | ICD-10-CM | POA: Insufficient documentation

## 2017-12-28 DIAGNOSIS — R059 Cough, unspecified: Secondary | ICD-10-CM

## 2017-12-28 DIAGNOSIS — C9002 Multiple myeloma in relapse: Secondary | ICD-10-CM | POA: Insufficient documentation

## 2017-12-28 DIAGNOSIS — Z5111 Encounter for antineoplastic chemotherapy: Secondary | ICD-10-CM | POA: Diagnosis not present

## 2017-12-28 DIAGNOSIS — R05 Cough: Secondary | ICD-10-CM

## 2017-12-28 DIAGNOSIS — Z7982 Long term (current) use of aspirin: Secondary | ICD-10-CM | POA: Insufficient documentation

## 2017-12-28 LAB — CBC WITH DIFFERENTIAL/PLATELET
BASOS PCT: 1 %
Basophils Absolute: 0 10*3/uL (ref 0–0.1)
EOS ABS: 0.1 10*3/uL (ref 0–0.7)
Eosinophils Relative: 2 %
HCT: 32.4 % — ABNORMAL LOW (ref 35.0–47.0)
HEMOGLOBIN: 11.1 g/dL — AB (ref 12.0–16.0)
LYMPHS ABS: 1.8 10*3/uL (ref 1.0–3.6)
Lymphocytes Relative: 32 %
MCH: 32.9 pg (ref 26.0–34.0)
MCHC: 34.4 g/dL (ref 32.0–36.0)
MCV: 95.6 fL (ref 80.0–100.0)
MONOS PCT: 6 %
Monocytes Absolute: 0.3 10*3/uL (ref 0.2–0.9)
NEUTROS ABS: 3.3 10*3/uL (ref 1.4–6.5)
NEUTROS PCT: 59 %
Platelets: 187 10*3/uL (ref 150–440)
RBC: 3.38 MIL/uL — ABNORMAL LOW (ref 3.80–5.20)
RDW: 15.4 % — ABNORMAL HIGH (ref 11.5–14.5)
WBC: 5.6 10*3/uL (ref 3.6–11.0)

## 2017-12-28 LAB — COMPREHENSIVE METABOLIC PANEL
ALT: 20 U/L (ref 14–54)
ANION GAP: 9 (ref 5–15)
AST: 27 U/L (ref 15–41)
Albumin: 3.8 g/dL (ref 3.5–5.0)
Alkaline Phosphatase: 55 U/L (ref 38–126)
BUN: 18 mg/dL (ref 6–20)
CALCIUM: 8.7 mg/dL — AB (ref 8.9–10.3)
CHLORIDE: 111 mmol/L (ref 101–111)
CO2: 21 mmol/L — AB (ref 22–32)
CREATININE: 0.77 mg/dL (ref 0.44–1.00)
GFR calc non Af Amer: >60 mL/min (ref >60)
Glucose, Bld: 116 mg/dL — ABNORMAL HIGH (ref 65–99)
Potassium: 3.8 mmol/L (ref 3.5–5.1)
SODIUM: 141 mmol/L (ref 135–145)
Total Bilirubin: 0.3 mg/dL (ref 0.3–1.2)
Total Protein: 6.7 g/dL (ref 6.5–8.1)

## 2017-12-28 LAB — BRAIN NATRIURETIC PEPTIDE: B NATRIURETIC PEPTIDE 5: 58 pg/mL (ref 0.0–100.0)

## 2017-12-28 MED ORDER — DEXTROSE 5 % IV SOLN
60.0000 mg | Freq: Once | INTRAVENOUS | Status: AC
  Administered 2017-12-28: 60 mg via INTRAVENOUS
  Filled 2017-12-28: qty 30

## 2017-12-28 MED ORDER — PALONOSETRON HCL INJECTION 0.25 MG/5ML
0.2500 mg | Freq: Once | INTRAVENOUS | Status: AC
  Administered 2017-12-28: 0.25 mg via INTRAVENOUS
  Filled 2017-12-28: qty 5

## 2017-12-28 MED ORDER — SODIUM CHLORIDE 0.9 % IV SOLN
Freq: Once | INTRAVENOUS | Status: AC
  Administered 2017-12-28: 11:00:00 via INTRAVENOUS
  Filled 2017-12-28: qty 1000

## 2017-12-28 MED ORDER — HEPARIN SOD (PORK) LOCK FLUSH 100 UNIT/ML IV SOLN
500.0000 [IU] | Freq: Once | INTRAVENOUS | Status: AC
  Administered 2017-12-28: 500 [IU] via INTRAVENOUS
  Filled 2017-12-28: qty 5

## 2017-12-28 MED ORDER — ALBUTEROL SULFATE HFA 108 (90 BASE) MCG/ACT IN AERS: 2.0000 | INHALATION_SPRAY | Freq: Four times a day (QID) | RESPIRATORY_TRACT | 2 refills | Status: DC | PRN

## 2017-12-28 MED ORDER — DEXAMETHASONE SODIUM PHOSPHATE 10 MG/ML IJ SOLN
10.0000 mg | Freq: Once | INTRAMUSCULAR | Status: AC
  Administered 2017-12-28: 10 mg via INTRAVENOUS
  Filled 2017-12-28: qty 1

## 2017-12-28 MED ORDER — SODIUM CHLORIDE 0.9 % IV SOLN
Freq: Once | INTRAVENOUS | Status: AC
  Administered 2017-12-28: 10:00:00 via INTRAVENOUS
  Filled 2017-12-28: qty 1000

## 2017-12-28 MED ORDER — CYCLOPHOSPHAMIDE CHEMO INJECTION 1 GM
500.0000 mg | Freq: Once | INTRAMUSCULAR | Status: AC
  Administered 2017-12-28: 500 mg via INTRAVENOUS
  Filled 2017-12-28: qty 25

## 2017-12-28 MED ORDER — SODIUM CHLORIDE 0.9% FLUSH
10.0000 mL | INTRAVENOUS | Status: DC | PRN
  Administered 2017-12-28 (×2): 10 mL via INTRAVENOUS
  Filled 2017-12-28: qty 10

## 2017-12-28 MED ORDER — DENOSUMAB 120 MG/1.7ML ~~LOC~~ SOLN
120.0000 mg | Freq: Once | SUBCUTANEOUS | Status: AC
  Administered 2017-12-28: 120 mg via SUBCUTANEOUS
  Filled 2017-12-28: qty 1.7

## 2017-12-28 NOTE — Telephone Encounter (Signed)
Voice mail box not set up. Will try to reach patient at another time.

## 2017-12-28 NOTE — Progress Notes (Signed)
Dexamethasone premed only ordered for Days 1 and 2.  Per MD ---added dex to Days 8 and 15 also.

## 2017-12-28 NOTE — Telephone Encounter (Signed)
Brandi [name]- I spoke to patient regarding cough; BNP negative/chest x-ray no fluid overload but bronchitis changes.  Recommend albuterol inhaler.  Please inform patient.

## 2017-12-28 NOTE — Progress Notes (Signed)
Patient states, "I still have a cough. It is the same as last week and hasn't gotten any worse. It's just a dry nagging cough. I am taking Mucinex for it." MD, Dr. Rogue Bussing, notified. Per MD order: proceed with scheduled treatment today. MD to place orders for BNP lab to be drawn and patient to go for Chest X-Ray after treatment today. Patient informed and verbalized understanding.

## 2017-12-29 ENCOUNTER — Inpatient Hospital Stay: Payer: Medicare HMO

## 2017-12-29 VITALS — BP 147/81 | HR 80 | Temp 96.5°F | Resp 18

## 2017-12-29 DIAGNOSIS — C9002 Multiple myeloma in relapse: Secondary | ICD-10-CM

## 2017-12-29 DIAGNOSIS — Z5111 Encounter for antineoplastic chemotherapy: Secondary | ICD-10-CM | POA: Diagnosis not present

## 2017-12-29 MED ORDER — SODIUM CHLORIDE 0.9 % IV SOLN
Freq: Once | INTRAVENOUS | Status: AC
  Administered 2017-12-29: 10:00:00 via INTRAVENOUS
  Filled 2017-12-29: qty 1000

## 2017-12-29 MED ORDER — DEXTROSE 5 % IV SOLN
60.0000 mg | Freq: Once | INTRAVENOUS | Status: AC
  Administered 2017-12-29: 60 mg via INTRAVENOUS
  Filled 2017-12-29: qty 30

## 2017-12-29 MED ORDER — SODIUM CHLORIDE 0.9 % IV SOLN
Freq: Once | INTRAVENOUS | Status: AC
Start: 2017-12-29 — End: 2017-12-29
  Administered 2017-12-29: 09:00:00 via INTRAVENOUS
  Filled 2017-12-29: qty 1000

## 2017-12-29 MED ORDER — HEPARIN SOD (PORK) LOCK FLUSH 100 UNIT/ML IV SOLN
500.0000 [IU] | Freq: Once | INTRAVENOUS | Status: AC | PRN
  Administered 2017-12-29: 500 [IU]

## 2017-12-29 NOTE — Telephone Encounter (Signed)
Patient came to clinic today- these results were discussed with patient in chemotherapy by Amy, RN

## 2017-12-30 ENCOUNTER — Other Ambulatory Visit: Payer: Self-pay | Admitting: *Deleted

## 2017-12-30 DIAGNOSIS — C9002 Multiple myeloma in relapse: Secondary | ICD-10-CM

## 2017-12-30 MED ORDER — AMLODIPINE BESYLATE 10 MG PO TABS: 10.0000 mg | ORAL_TABLET | Freq: Every day | ORAL | 4 refills | Status: DC

## 2018-01-04 ENCOUNTER — Inpatient Hospital Stay: Payer: Medicare HMO

## 2018-01-04 ENCOUNTER — Inpatient Hospital Stay (HOSPITAL_BASED_OUTPATIENT_CLINIC_OR_DEPARTMENT_OTHER): Payer: Medicare HMO | Admitting: Oncology

## 2018-01-04 ENCOUNTER — Ambulatory Visit: Payer: Medicare HMO

## 2018-01-04 ENCOUNTER — Encounter: Payer: Self-pay | Admitting: Oncology

## 2018-01-04 VITALS — BP 180/74 | HR 71 | Temp 98.8°F | Resp 16 | Wt 167.0 lb

## 2018-01-04 DIAGNOSIS — M899 Disorder of bone, unspecified: Secondary | ICD-10-CM | POA: Diagnosis not present

## 2018-01-04 DIAGNOSIS — M7989 Other specified soft tissue disorders: Secondary | ICD-10-CM | POA: Diagnosis not present

## 2018-01-04 DIAGNOSIS — C9002 Multiple myeloma in relapse: Secondary | ICD-10-CM

## 2018-01-04 DIAGNOSIS — F17210 Nicotine dependence, cigarettes, uncomplicated: Secondary | ICD-10-CM

## 2018-01-04 DIAGNOSIS — Z5111 Encounter for antineoplastic chemotherapy: Secondary | ICD-10-CM | POA: Diagnosis not present

## 2018-01-04 DIAGNOSIS — R69 Illness, unspecified: Secondary | ICD-10-CM | POA: Diagnosis not present

## 2018-01-04 LAB — COMPREHENSIVE METABOLIC PANEL
ALT: 32 U/L (ref 14–54)
AST: 28 U/L (ref 15–41)
Albumin: 4 g/dL (ref 3.5–5.0)
Alkaline Phosphatase: 70 U/L (ref 38–126)
Anion gap: 8 (ref 5–15)
BILIRUBIN TOTAL: 0.4 mg/dL (ref 0.3–1.2)
BUN: 16 mg/dL (ref 6–20)
CO2: 23 mmol/L (ref 22–32)
CREATININE: 0.85 mg/dL (ref 0.44–1.00)
Calcium: 9 mg/dL (ref 8.9–10.3)
Chloride: 108 mmol/L (ref 101–111)
Glucose, Bld: 107 mg/dL — ABNORMAL HIGH (ref 65–99)
Potassium: 3.9 mmol/L (ref 3.5–5.1)
Sodium: 139 mmol/L (ref 135–145)
TOTAL PROTEIN: 7 g/dL (ref 6.5–8.1)

## 2018-01-04 LAB — CBC WITH DIFFERENTIAL/PLATELET
BASOS ABS: 0 10*3/uL (ref 0–0.1)
BASOS PCT: 0 %
EOS ABS: 0.1 10*3/uL (ref 0–0.7)
Eosinophils Relative: 3 %
HCT: 33.3 % — ABNORMAL LOW (ref 35.0–47.0)
HEMOGLOBIN: 11.3 g/dL — AB (ref 12.0–16.0)
Lymphocytes Relative: 42 %
Lymphs Abs: 1.6 10*3/uL (ref 1.0–3.6)
MCH: 32.4 pg (ref 26.0–34.0)
MCHC: 34 g/dL (ref 32.0–36.0)
MCV: 95.4 fL (ref 80.0–100.0)
Monocytes Absolute: 0.4 10*3/uL (ref 0.2–0.9)
Monocytes Relative: 10 %
NEUTROS PCT: 45 %
Neutro Abs: 1.8 10*3/uL (ref 1.4–6.5)
PLATELETS: 224 10*3/uL (ref 150–440)
RBC: 3.49 MIL/uL — AB (ref 3.80–5.20)
RDW: 15.5 % — ABNORMAL HIGH (ref 11.5–14.5)
WBC: 3.9 10*3/uL (ref 3.6–11.0)

## 2018-01-04 MED ORDER — CYCLOPHOSPHAMIDE CHEMO INJECTION 1 GM
500.0000 mg | Freq: Once | INTRAMUSCULAR | Status: AC
  Administered 2018-01-04: 500 mg via INTRAVENOUS
  Filled 2018-01-04: qty 25

## 2018-01-04 MED ORDER — CARFILZOMIB CHEMO INJECTION 60 MG
60.0000 mg | Freq: Once | INTRAVENOUS | Status: AC
  Administered 2018-01-04: 60 mg via INTRAVENOUS
  Filled 2018-01-04: qty 30

## 2018-01-04 MED ORDER — SODIUM CHLORIDE 0.9 % IV SOLN
Freq: Once | INTRAVENOUS | Status: AC
  Administered 2018-01-04: 13:00:00 via INTRAVENOUS
  Filled 2018-01-04: qty 1000

## 2018-01-04 MED ORDER — PALONOSETRON HCL INJECTION 0.25 MG/5ML
0.2500 mg | Freq: Once | INTRAVENOUS | Status: AC
  Administered 2018-01-04: 0.25 mg via INTRAVENOUS
  Filled 2018-01-04: qty 5

## 2018-01-04 MED ORDER — HEPARIN SOD (PORK) LOCK FLUSH 100 UNIT/ML IV SOLN
500.0000 [IU] | Freq: Once | INTRAVENOUS | Status: AC | PRN
  Administered 2018-01-04: 500 [IU]

## 2018-01-04 NOTE — Progress Notes (Signed)
[place] OFFICE PROGRESS NOTE  Patient Care Team: Center, [facility] as PCP - General (General Practice)  Cancer Staging No matching staging information was found for the patient.   Oncology History   # SEP 2018- MULTIPLE MYELOMA IgALamda [2.5 gm/dl; K/L= 88/1298]; STAGE III [beta 2 microglobulin=5.5] [presented with acute renal failure; anemia; NO hypercalcemia; Skeletal survey-Normal]; BMBx- 45% plasma cells; FISH-POSITIVE 11:14 translocation.[STANDARD-high RISK]/cyto-Normal; SEP 2018- PET- L3 posterior element lesion.   # 9/14- velcade SQ twice weekly/Dex 40 mg/week; OCT 5th 2018-Start R [19m]VD; 3cycles of RVD- PARTAL RESPONSE  # Jan 11th 2019-Dara-Rev-Dex; April 2019- BMBx- plasma cell -by CD-138/IHC-80% [baseline Sep 2018- 85% ]; HOLD transplant [dw Dr.Gasperatto]  # May 2019- carfil-Cyt-Dex  # Acute renal failure [Dr.Singh; Proteinuria 1.5gm/day ]; acyclovir/Asprin -------------------------------------------------------------   DIAGNOSIS: '[ ]'  MULTIPLE MYELOMA  STAGE: III/HIGH RISK ;GOALS: CONTROL  CURRENT/MOST RECENT THERAPY [ MAY 2019- KYPROLIS-CYTOXAN-DEX      [place] in relapse ([place]   11/07/2017 -  Chemotherapy    The patient had palonosetron (ALOXI) injection 0.25 mg, 0.25 mg, Intravenous,  Once, 2 of 4 cycles Administration: 0.25 mg (11/21/2017), 0.25 mg (11/29/2017), 0.25 mg (12/05/2017), 0.25 mg (12/21/2017), 0.25 mg (12/28/2017), 0.25 mg (01/04/2018) cyclophosphamide (CYTOXAN) 500 mg in sodium chloride 0.9 % 250 mL chemo infusion, 540 mg, Intravenous,  Once, 2 of 4 cycles Administration: 500 mg (11/21/2017), 500 mg (11/29/2017), 500 mg (12/05/2017), 500 mg (12/28/2017), 500 mg (01/04/2018) carfilzomib (KYPROLIS) 36 mg in dextrose 5 % 50 mL chemo infusion, 20 mg/m2 = 36 mg, Intravenous, Once, 2 of 4 cycles Administration: 36 mg (11/21/2017), 36 mg (11/22/2017), 60 mg (11/29/2017), 60 mg (11/30/2017), 60 mg (12/05/2017), 60 mg (12/06/2017), 60 mg  (12/21/2017), 60 mg (12/22/2017), 60 mg (12/28/2017), 60 mg (12/29/2017), 60 mg (01/04/2018), 60 mg (01/05/2018)  for chemotherapy treatment.          INTERVAL HISTORY: Patient presents today with above history of multiple myeloma currently on Kyprolis-Cytoxan and dexamethasone.  Tolerating well.  Patient denies any pain, tingling or numbness, shortness of breath or cough.  She does admit to swelling in bilateral lower extremities right greater than left.  She uses TED hose with relief.  Review of Systems  Constitutional: Negative.  Negative for chills, fever, malaise/fatigue and weight loss.  HENT: Negative for congestion and ear pain.   Eyes: Negative.  Negative for blurred vision and double vision.  Respiratory: Negative.  Negative for cough, sputum production and shortness of breath.   Cardiovascular: Negative.  Negative for chest pain, palpitations and leg swelling.  Gastrointestinal: Negative.  Negative for abdominal pain, constipation, diarrhea, nausea and vomiting.  Genitourinary: Negative for dysuria, frequency and urgency.  Musculoskeletal: Negative for back pain and falls.       Bilateral lower extremity swelling  Skin: Negative.  Negative for rash.  Neurological: Negative.  Negative for weakness and headaches.  Endo/Heme/Allergies: Negative.  Does not bruise/bleed easily.  Psychiatric/Behavioral: Negative.  Negative for depression. The patient is not nervous/anxious and does not have insomnia.       PAST MEDICAL HISTORY :  Past Medical History:  Diagnosis Date  . Hypertension   . Multiple myeloma ([place]     PAST SURGICAL HISTORY :   Past Surgical History:  Procedure Laterality Date  . IR FLUORO GUIDE PORT INSERTION RIGHT  12/16/2017    FAMILY HISTORY :   Family History  Problem Relation Age of Onset  . Pneumonia Mother   . Seizures Father  SOCIAL HISTORY:   Social History   Tobacco Use  . Smoking status: Current Every Day Smoker    Packs/day: 0.50    Types:  Cigarettes  . Smokeless tobacco: Never Used  Substance Use Topics  . Alcohol use: No  . Drug use: No    ALLERGIES:  has No Known Allergies.  MEDICATIONS:  Current Outpatient Medications  Medication Sig Dispense Refill  . acetaminophen (TYLENOL) 500 MG tablet Take 1 tablet (500 mg total) by mouth every 6 (six) hours as needed (TAKE 1 TABLET DAILY X 4 DAYS-STARTING 2 DAYS PRIOR TO CHEMOTHERAPY.). (Patient taking differently: Take 1,000 mg by mouth every 6 (six) hours as needed (TAKE 1 TABLET DAILY X 4 DAYS-STARTING 2 DAYS PRIOR TO CHEMOTHERAPY.). ) 30 tablet 0  . acyclovir (ZOVIRAX) 400 MG tablet One pill a day [to prevent shingles] 30 tablet 6  . albuterol (PROVENTIL HFA;VENTOLIN HFA) 108 (90 Base) MCG/ACT inhaler Inhale 2 puffs into the lungs every 6 (six) hours as needed for wheezing or shortness of breath. 1 Inhaler 2  . amLODipine (NORVASC) 10 MG tablet Take 1 tablet (10 mg total) by mouth daily. 30 tablet 4  . aspirin 325 MG tablet Take 1 tablet by mouth daily.    [name] dexamethasone (DECADRON) 4 MG tablet Take 10 tablets (40 mg total) by mouth once a week. 10 pills in AM with food. 80 tablet 2  . feeding supplement, ENSURE ENLIVE, (ENSURE ENLIVE) LIQD Take 237 mLs by mouth 2 (two) times daily between meals. 60 Bottle 0  . HYDROcodone-acetaminophen (NORCO) 5-325 MG tablet Take 1 tablet by mouth every 8 (eight) hours as needed for moderate pain. 60 tablet 0  . lidocaine-prilocaine (EMLA) cream Apply 1 application topically as needed. Apply small amount to port site at least 1 hour prior to it being accessed, cover with plastic wrap 30 g 1  . loratadine (CLARITIN) 10 MG tablet Take 1 tablet (10 mg total) by mouth daily. Take daily x 4 days (start 2 days prior to chemo) 30 tablet 3  . montelukast (SINGULAIR) 10 MG tablet Take 1 tablet (10 mg total) by mouth at bedtime. Start 2 days prior to chemo. Take for 4 days total 30 tablet 3  . pantoprazole (PROTONIX) 40 MG tablet Take 1 tablet (40 mg  total) by mouth daily. 30 tablet 3  . polyethylene glycol (MIRALAX / GLYCOLAX) packet Take 17 g by mouth daily as needed for mild constipation. 14 each 0  . potassium chloride SA (K-DUR,KLOR-CON) 20 MEQ tablet Take 1 tablet (20 mEq total) by mouth 2 (two) times daily. 60 tablet 3  . ranitidine (ZANTAC) 150 MG tablet Take 1 tablet (150 mg total) by mouth 2 (two) times daily. START TAKING 2 DAYS PRIOR TO CHEMOTHERAPY. TAKE FOR A TOTAL OF 4 DAYS    . REVLIMID 20 MG capsule TAKE 1 CAPSULE BY MOUTH EVERY DAY FOR 14 DAYS 14 capsule 0  . temazepam (RESTORIL) 7.5 MG capsule Take 1 capsule (7.5 mg total) by mouth at bedtime. 30 capsule 0   No current facility-administered medications for this visit.     PHYSICAL EXAMINATION: ECOG PERFORMANCE STATUS: 1 - Symptomatic but completely ambulatory  BP (!) 180/74 (BP Location: Left Arm, Patient Position: Sitting)   Pulse 71   Temp 98.8 F (37.1 C) (Tympanic)   Resp 16   Wt 167 lb (75.8 kg)   BMI 30.54 kg/m   Filed Weights   01/04/18 1136  Weight: 167 lb (75.8 kg)  Physical Exam  Constitutional: She is oriented to person, place, and time. Vital signs are normal. She appears well-developed and well-nourished.  HENT:  Head: Normocephalic and atraumatic.  Eyes: Pupils are equal, round, and reactive to light.  Neck: Normal range of motion.  Cardiovascular: Normal rate, regular rhythm and normal heart sounds.  No murmur heard. Pulmonary/Chest: Effort normal and breath sounds normal. She has no wheezes.  Abdominal: Soft. Normal appearance and bowel sounds are normal. She exhibits no distension. There is no tenderness.  Musculoskeletal: Normal range of motion. She exhibits no edema.       Right ankle: She exhibits swelling.       Left ankle: She exhibits swelling.  Right > left edema   Neurological: She is alert and oriented to person, place, and time.  Skin: Skin is warm and dry. No rash noted.  Psychiatric: Judgment normal.  Nursing note and  vitals reviewed.     LABORATORY DATA:  I have reviewed the data as listed    Component Value Date/Time   NA 139 01/04/2018 1110   K 3.9 01/04/2018 1110   CL 108 01/04/2018 1110   CO2 23 01/04/2018 1110   GLUCOSE 107 (H) 01/04/2018 1110   BUN 16 01/04/2018 1110   CREATININE 0.85 01/04/2018 1110   CALCIUM 9.0 01/04/2018 1110   PROT 7.0 01/04/2018 1110   ALBUMIN 4.0 01/04/2018 1110   AST 28 01/04/2018 1110   ALT 32 01/04/2018 1110   ALKPHOS 70 01/04/2018 1110   BILITOT 0.4 01/04/2018 1110   GFRNONAA >60 01/04/2018 1110   GFRAA >60 01/04/2018 1110    No results found for: SPEP, UPEP  Lab Results  Component Value Date   WBC 3.9 01/04/2018   NEUTROABS 1.8 01/04/2018   HGB 11.3 (L) 01/04/2018   HCT 33.3 (L) 01/04/2018   MCV 95.4 01/04/2018   PLT 224 01/04/2018      Chemistry      Component Value Date/Time   NA 139 01/04/2018 1110   K 3.9 01/04/2018 1110   CL 108 01/04/2018 1110   CO2 23 01/04/2018 1110   BUN 16 01/04/2018 1110   CREATININE 0.85 01/04/2018 1110      Component Value Date/Time   CALCIUM 9.0 01/04/2018 1110   ALKPHOS 70 01/04/2018 1110   AST 28 01/04/2018 1110   ALT 32 01/04/2018 1110   BILITOT 0.4 01/04/2018 1110       RADIOGRAPHIC STUDIES: I have personally reviewed the radiological images as listed and agreed with the findings in the report. No results found.   ASSESSMENT & PLAN:  Multiple myeloma in relapse ([place]) #Multiple myeloma stage III high-risk cytogenetics; transplant eligible.  On Kyprolis-Cytoxan- dexamethasone. Stable.  Labs okay for treatment today.  Proceed with treatment.  Skeletal lesions: Currently on Xgeva.  Calcium 9.  Continue calcium and vitamin D.  Last Delton See was given on 12/28/2017.   Infection prophylaxis: Continue acyclovir/DVT prophylaxis.  On aspirin 325 mg daily.  Bilateral lower extremity swelling: Likely related to treatment (Kyprolis > 20 % Peripheral edema).  Continue to elevate legs and to stay adequately  hydrated.  RTC in 2 weeks with labs (mm panel), MD and Kyprolis/Cytoxan/Dexamethosone   Greater than 50% was spent in counseling and coordination of care with this patient including but not limited to discussion of the relevant topics above (See A&P) including, but not limited to diagnosis and management of acute and chronic medical conditions.    No orders of the defined types were placed  in this encounter.  All questions were answered. The patient knows to call the clinic with any problems, questions or concerns.      Jacquelin Hawking, NP 01/12/2018 3:41 PM

## 2018-01-05 ENCOUNTER — Inpatient Hospital Stay: Payer: Medicare HMO

## 2018-01-05 VITALS — BP 113/72 | HR 81 | Temp 98.4°F | Resp 18

## 2018-01-05 DIAGNOSIS — C9002 Multiple myeloma in relapse: Secondary | ICD-10-CM

## 2018-01-05 DIAGNOSIS — Z5111 Encounter for antineoplastic chemotherapy: Secondary | ICD-10-CM | POA: Diagnosis not present

## 2018-01-05 MED ORDER — DEXTROSE 5 % IV SOLN
60.0000 mg | Freq: Once | INTRAVENOUS | Status: AC
  Administered 2018-01-05: 60 mg via INTRAVENOUS
  Filled 2018-01-05: qty 30

## 2018-01-05 MED ORDER — SODIUM CHLORIDE 0.9 % IV SOLN
INTRAVENOUS | Status: DC
  Administered 2018-01-05: 10:00:00 via INTRAVENOUS
  Filled 2018-01-05: qty 1000

## 2018-01-05 MED ORDER — SODIUM CHLORIDE 0.9 % IV SOLN
Freq: Once | INTRAVENOUS | Status: AC
  Administered 2018-01-05: 10:00:00 via INTRAVENOUS
  Filled 2018-01-05: qty 1000

## 2018-01-05 MED ORDER — HEPARIN SOD (PORK) LOCK FLUSH 100 UNIT/ML IV SOLN
500.0000 [IU] | Freq: Once | INTRAVENOUS | Status: AC
  Administered 2018-01-05: 500 [IU] via INTRAVENOUS

## 2018-01-05 MED ORDER — SODIUM CHLORIDE 0.9% FLUSH
10.0000 mL | INTRAVENOUS | Status: DC | PRN
  Administered 2018-01-05: 10 mL via INTRAVENOUS
  Filled 2018-01-05: qty 10

## 2018-01-06 ENCOUNTER — Encounter: Payer: Self-pay | Admitting: Internal Medicine

## 2018-01-09 ENCOUNTER — Telehealth: Payer: Self-pay | Admitting: *Deleted

## 2018-01-09 NOTE — Telephone Encounter (Signed)
[employer] called stating ordered Proventil is not covered under patient plan, but Ventolin is covered and they are requesting we order ventolin for her. I called and spoke with rep and explained that what we ordered was generic albuterol which covers both Proventil and Ventolin. She gave me an [facility] # [id number] which the pharmacy needs to use to submit claim for it to be paid for by insurance.  I called [facility] to give them correct NDC to file and they thanked me for letting them know

## 2018-01-12 NOTE — Assessment & Plan Note (Addendum)
#  Multiple myeloma stage III high-risk cytogenetics; transplant eligible.  On Kyprolis-Cytoxan- dexamethasone. Stable.  Labs okay for treatment today.  Proceed with treatment.  Skeletal lesions: Currently on Xgeva.  Calcium 9.  Continue calcium and vitamin D.  Last Delton See was given on 12/28/2017.   Infection prophylaxis: Continue acyclovir/DVT prophylaxis.  On aspirin 325 mg daily.  Bilateral lower extremity swelling: Likely related to treatment (Kyprolis > 20 % Peripheral edema).  Continue to elevate legs and to stay adequately hydrated.  RTC in 2 weeks with labs (mm panel), MD and Kyprolis/Cytoxan/Dexamethosone

## 2018-01-18 ENCOUNTER — Other Ambulatory Visit: Payer: Self-pay

## 2018-01-18 ENCOUNTER — Encounter: Payer: Self-pay | Admitting: Internal Medicine

## 2018-01-18 ENCOUNTER — Inpatient Hospital Stay: Payer: Medicare HMO

## 2018-01-18 ENCOUNTER — Inpatient Hospital Stay (HOSPITAL_BASED_OUTPATIENT_CLINIC_OR_DEPARTMENT_OTHER): Payer: Medicare HMO | Admitting: Internal Medicine

## 2018-01-18 VITALS — BP 125/82 | HR 69 | Temp 97.9°F | Resp 18 | Ht 62.0 in | Wt 167.5 lb

## 2018-01-18 DIAGNOSIS — C9002 Multiple myeloma in relapse: Secondary | ICD-10-CM

## 2018-01-18 DIAGNOSIS — Z7982 Long term (current) use of aspirin: Secondary | ICD-10-CM | POA: Diagnosis not present

## 2018-01-18 DIAGNOSIS — Z5111 Encounter for antineoplastic chemotherapy: Secondary | ICD-10-CM | POA: Diagnosis not present

## 2018-01-18 LAB — CBC WITH DIFFERENTIAL/PLATELET
BASOS PCT: 1 %
Basophils Absolute: 0.1 10*3/uL (ref 0–0.1)
EOS ABS: 0.2 10*3/uL (ref 0–0.7)
Eosinophils Relative: 4 %
HCT: 32.7 % — ABNORMAL LOW (ref 35.0–47.0)
HEMOGLOBIN: 11.2 g/dL — AB (ref 12.0–16.0)
Lymphocytes Relative: 44 %
Lymphs Abs: 2.3 10*3/uL (ref 1.0–3.6)
MCH: 32.5 pg (ref 26.0–34.0)
MCHC: 34.2 g/dL (ref 32.0–36.0)
MCV: 95 fL (ref 80.0–100.0)
MONO ABS: 0.3 10*3/uL (ref 0.2–0.9)
Monocytes Relative: 6 %
NEUTROS ABS: 2.4 10*3/uL (ref 1.4–6.5)
Neutrophils Relative %: 45 %
Platelets: 300 10*3/uL (ref 150–440)
RBC: 3.44 MIL/uL — ABNORMAL LOW (ref 3.80–5.20)
RDW: 14.8 % — ABNORMAL HIGH (ref 11.5–14.5)
WBC: 5.3 10*3/uL (ref 3.6–11.0)

## 2018-01-18 LAB — COMPREHENSIVE METABOLIC PANEL
ALBUMIN: 3.8 g/dL (ref 3.5–5.0)
ALK PHOS: 61 U/L (ref 38–126)
ALT: 17 U/L (ref 0–44)
AST: 17 U/L (ref 15–41)
Anion gap: 8 (ref 5–15)
BUN: 17 mg/dL (ref 8–23)
CALCIUM: 8.9 mg/dL (ref 8.9–10.3)
CO2: 24 mmol/L (ref 22–32)
CREATININE: 0.73 mg/dL (ref 0.44–1.00)
Chloride: 112 mmol/L — ABNORMAL HIGH (ref 98–111)
GFR calc Af Amer: >60 mL/min (ref >60)
GFR calc non Af Amer: >60 mL/min (ref >60)
GLUCOSE: 102 mg/dL — AB (ref 70–99)
Potassium: 4 mmol/L (ref 3.5–5.1)
SODIUM: 144 mmol/L (ref 135–145)
Total Bilirubin: 0.6 mg/dL (ref 0.3–1.2)
Total Protein: 6.5 g/dL (ref 6.5–8.1)

## 2018-01-18 MED ORDER — PALONOSETRON HCL INJECTION 0.25 MG/5ML
0.2500 mg | Freq: Once | INTRAVENOUS | Status: AC
  Administered 2018-01-18: 0.25 mg via INTRAVENOUS
  Filled 2018-01-18: qty 5

## 2018-01-18 MED ORDER — SODIUM CHLORIDE 0.9% FLUSH
10.0000 mL | INTRAVENOUS | Status: DC | PRN
  Administered 2018-01-18: 10 mL via INTRAVENOUS
  Filled 2018-01-18: qty 10

## 2018-01-18 MED ORDER — DEXAMETHASONE SODIUM PHOSPHATE 10 MG/ML IJ SOLN
10.0000 mg | Freq: Once | INTRAMUSCULAR | Status: AC
  Administered 2018-01-18: 10 mg via INTRAVENOUS
  Filled 2018-01-18: qty 1

## 2018-01-18 MED ORDER — SODIUM CHLORIDE 0.9 % IV SOLN
500.0000 mg | Freq: Once | INTRAVENOUS | Status: AC
  Administered 2018-01-18: 500 mg via INTRAVENOUS
  Filled 2018-01-18: qty 25

## 2018-01-18 MED ORDER — DEXTROSE 5 % IV SOLN
60.0000 mg | Freq: Once | INTRAVENOUS | Status: AC
  Administered 2018-01-18: 60 mg via INTRAVENOUS
  Filled 2018-01-18: qty 30

## 2018-01-18 MED ORDER — HEPARIN SOD (PORK) LOCK FLUSH 100 UNIT/ML IV SOLN
500.0000 [IU] | Freq: Once | INTRAVENOUS | Status: AC
  Administered 2018-01-18: 500 [IU] via INTRAVENOUS
  Filled 2018-01-18: qty 5

## 2018-01-18 MED ORDER — SODIUM CHLORIDE 0.9 % IV SOLN
Freq: Once | INTRAVENOUS | Status: AC
  Administered 2018-01-18: 10:00:00 via INTRAVENOUS
  Filled 2018-01-18: qty 1000

## 2018-01-18 MED ORDER — SODIUM CHLORIDE 0.9 % IV SOLN
Freq: Once | INTRAVENOUS | Status: AC
Start: 2018-01-18 — End: 2018-01-18
  Administered 2018-01-18: 11:00:00 via INTRAVENOUS
  Filled 2018-01-18: qty 1000

## 2018-01-18 MED ORDER — HEPARIN SOD (PORK) LOCK FLUSH 100 UNIT/ML IV SOLN
INTRAVENOUS | Status: AC
  Filled 2018-01-18: qty 5

## 2018-01-18 NOTE — Assessment & Plan Note (Addendum)
#  Multiple myeloma stage III high-risk cytogenetics; transplant eligible.  On Kyprolis-Cytoxan- dexamethasone.  Improving clinically.  Myeloma labs improving-with normalization of the kappa lambda light chain ratio.  Discussed that patient will likely need a repeat bone marrow biopsy prior to proceeding with stem cell transplant.Brandi [name]  #Proceed with Kyprolis plus Cytoxan today.  Labs are adequate.  Skeletal lesions: Continue Xgeva.  Stable.  # Infection prophylaxis: Continue acyclovir/DVT prophylaxis.  On aspirin 325 mg daily.  # follow up in 2 week/labs/Rx; 1 week/labs-Rx.

## 2018-01-18 NOTE — Progress Notes (Signed)
[place] OFFICE PROGRESS NOTE  Patient Care Team: Center, [facility] as PCP - General (General Practice)  Cancer Staging No matching staging information was found for the patient.   Oncology History   # SEP 2018- MULTIPLE MYELOMA IgALamda [2.5 gm/dl; K/L= 88/1298]; STAGE III [beta 2 microglobulin=5.5] [presented with acute renal failure; anemia; NO hypercalcemia; Skeletal survey-Normal]; BMBx- 45% plasma cells; FISH-POSITIVE 11:14 translocation.[STANDARD-high RISK]/cyto-Normal; SEP 2018- PET- L3 posterior element lesion.   # 9/14- velcade SQ twice weekly/Dex 40 mg/week; OCT 5th 2018-Start R [60m]VD; 3cycles of RVD- PARTAL RESPONSE  # Jan 11th 2019-Dara-Rev-Dex; April 2019- BMBx- plasma cell -by CD-138/IHC-80% [baseline Sep 2018- 85% ]; HOLD transplant [dw Dr.Gasperatto]  # May 2019- carfil-Cyt-Dex  # Acute renal failure [Dr.Singh; Proteinuria 1.5gm/day ]; acyclovir/Asprin -------------------------------------------------------------   DIAGNOSIS: '[ ]'  MULTIPLE MYELOMA  STAGE: III/HIGH RISK ;GOALS: CONTROL  CURRENT/MOST RECENT THERAPY [ MAY 2019- KYPROLIS-CYTOXAN-DEX      [place]   11/07/2017 -  Chemotherapy    The patient had palonosetron (ALOXI) injection 0.25 mg, 0.25 mg, Intravenous,  Once, 3 of 4 cycles Administration: 0.25 mg (11/21/2017), 0.25 mg (11/29/2017), 0.25 mg (12/05/2017), 0.25 mg (12/21/2017), 0.25 mg (12/28/2017), 0.25 mg (01/04/2018), 0.25 mg (01/18/2018) cyclophosphamide (CYTOXAN) 500 mg in sodium chloride 0.9 % 250 mL chemo infusion, 540 mg, Intravenous,  Once, 3 of 4 cycles Administration: 500 mg (11/21/2017), 500 mg (11/29/2017), 500 mg (12/05/2017), 500 mg (12/28/2017), 500 mg (01/04/2018), 500 mg (01/18/2018) carfilzomib (KYPROLIS) 36 mg in dextrose 5 % 50 mL chemo infusion, 20 mg/m2 = 36 mg, Intravenous, Once, 3 of 4 cycles Administration: 36 mg (11/21/2017), 36 mg (11/22/2017), 60 mg (11/29/2017), 60 mg (11/30/2017), 60 mg  (12/05/2017), 60 mg (12/06/2017), 60 mg (12/21/2017), 60 mg (12/22/2017), 60 mg (12/28/2017), 60 mg (12/29/2017), 60 mg (01/04/2018), 60 mg (01/05/2018), 60 mg (01/18/2018), 60 mg (01/19/2018)  for chemotherapy treatment.          INTERVAL HISTORY:  Brandi [id number].  female pleasant patient above history of multiple myeloma high risk currently on third line therapy with Kyprolis-Cytoxan is here for follow-up  Patient denies any pain.  Energy levels are adequate.   No shortness of the cough.  Complains of mild swelling in the legs.  No shortness of breath or cough.  Review of Systems  Constitutional: Positive for malaise/fatigue. Negative for chills, diaphoresis, fever and weight loss.  HENT: Negative for nosebleeds and sore throat.   Eyes: Negative for double vision.  Respiratory: Negative for cough, hemoptysis, sputum production, shortness of breath and wheezing.   Cardiovascular: Negative for chest pain, palpitations, orthopnea and leg swelling.  Gastrointestinal: Negative for abdominal pain, blood in stool, constipation, diarrhea, heartburn, melena, nausea and vomiting.  Genitourinary: Negative for dysuria, frequency and urgency.  Musculoskeletal: Negative for back pain and joint pain.  Skin: Negative.  Negative for itching and rash.  Neurological: Negative for dizziness, tingling, focal weakness, weakness and headaches.  Endo/Heme/Allergies: Does not bruise/bleed easily.  Psychiatric/Behavioral: Negative for depression. The patient is not nervous/anxious and does not have insomnia.       PAST MEDICAL HISTORY :  Past Medical History:  Diagnosis Date  . Hypertension   . [place]     PAST SURGICAL HISTORY :   Past Surgical History:  Procedure Laterality Date  . IR FLUORO GUIDE PORT INSERTION RIGHT  12/16/2017    FAMILY HISTORY :   Family History  Problem Relation Age of Onset  .  Pneumonia Mother   . Seizures Father     SOCIAL HISTORY:   Social History    Tobacco Use  . Smoking status: Current Every Day Smoker    Packs/day: 0.50    Types: Cigarettes  . Smokeless tobacco: Never Used  Substance Use Topics  . Alcohol use: No  . Drug use: No    ALLERGIES:  has No Known Allergies.  MEDICATIONS:  Current Outpatient Medications  Medication Sig Dispense Refill  . acetaminophen (TYLENOL) 500 MG tablet Take 1 tablet (500 mg total) by mouth every 6 (six) hours as needed (TAKE 1 TABLET DAILY X 4 DAYS-STARTING 2 DAYS PRIOR TO CHEMOTHERAPY.). (Patient taking differently: Take 1,000 mg by mouth every 6 (six) hours as needed (TAKE 1 TABLET DAILY X 4 DAYS-STARTING 2 DAYS PRIOR TO CHEMOTHERAPY.). ) 30 tablet 0  . acyclovir (ZOVIRAX) 400 MG tablet One pill a day [to prevent shingles] 30 tablet 6  . albuterol (PROVENTIL HFA;VENTOLIN HFA) 108 (90 Base) MCG/ACT inhaler Inhale 2 puffs into the lungs every 6 (six) hours as needed for wheezing or shortness of breath. 1 Inhaler 2  . amLODipine (NORVASC) 10 MG tablet Take 1 tablet (10 mg total) by mouth daily. 30 tablet 4  . aspirin 325 MG tablet Take 1 tablet by mouth daily.    [name] dexamethasone (DECADRON) 4 MG tablet Take 10 tablets (40 mg total) by mouth once a week. 10 pills in AM with food. (Patient taking differently: Take 20 mg by mouth once a week. 5 pills in AM with food.) 80 tablet 2  . feeding supplement, ENSURE ENLIVE, (ENSURE ENLIVE) LIQD Take 237 mLs by mouth 2 (two) times daily between meals. 60 Bottle 0  . lidocaine-prilocaine (EMLA) cream Apply 1 application topically as needed. Apply small amount to port site at least 1 hour prior to it being accessed, cover with plastic wrap 30 g 1  . loratadine (CLARITIN) 10 MG tablet Take 1 tablet (10 mg total) by mouth daily. Take daily x 4 days (start 2 days prior to chemo) 30 tablet 3  . montelukast (SINGULAIR) 10 MG tablet Take 1 tablet (10 mg total) by mouth at bedtime. Start 2 days prior to chemo. Take for 4 days total 30 tablet 3  . pantoprazole (PROTONIX)  40 MG tablet Take 1 tablet (40 mg total) by mouth daily. 30 tablet 3  . polyethylene glycol (MIRALAX / GLYCOLAX) packet Take 17 g by mouth daily as needed for mild constipation. 14 each 0  . potassium chloride SA (K-DUR,KLOR-CON) 20 MEQ tablet Take 1 tablet (20 mEq total) by mouth 2 (two) times daily. 60 tablet 3  . ranitidine (ZANTAC) 150 MG tablet Take 1 tablet (150 mg total) by mouth 2 (two) times daily. START TAKING 2 DAYS PRIOR TO CHEMOTHERAPY. TAKE FOR A TOTAL OF 4 DAYS    . temazepam (RESTORIL) 7.5 MG capsule Take 1 capsule (7.5 mg total) by mouth at bedtime. 30 capsule 0  . HYDROcodone-acetaminophen (NORCO) 5-325 MG tablet Take 1 tablet by mouth every 8 (eight) hours as needed for moderate pain. (Patient not taking: Reported on 01/18/2018) 60 tablet 0  . REVLIMID 20 MG capsule TAKE 1 CAPSULE BY MOUTH EVERY DAY FOR 14 DAYS (Patient not taking: Reported on 01/18/2018) 14 capsule 0   No current facility-administered medications for this visit.     PHYSICAL EXAMINATION: ECOG PERFORMANCE STATUS: 1 - Symptomatic but completely ambulatory  BP 125/82   Pulse 69   Temp 97.9 F (36.6 C) (  Tympanic)   Resp 18   Ht '5\' 2"'  (1.575 m)   Wt 167 lb 8 oz (76 kg)   BMI 30.64 kg/m   Filed Weights   01/18/18 0916  Weight: 167 lb 8 oz (76 kg)    GENERAL: Well-nourished well-developed; Alert, no distress and comfortable.  Alone. EYES: no pallor or icterus OROPHARYNX: no thrush or ulceration; NECK: supple; no lymph nodes felt. LYMPH:  no palpable lymphadenopathy in the axillary or inguinal regions LUNGS: Decreased breath sounds auscultation bilaterally. No wheeze or crackles HEART/CVS: regular rate & rhythm and no murmurs; 1+ bilateral lower extremity edema ABDOMEN:abdomen soft, non-tender and normal bowel sounds. No hepatomegaly or splenomegaly.  Musculoskeletal:no cyanosis of digits and no clubbing  PSYCH: alert & oriented x 3 with fluent speech NEURO: no focal motor/sensory deficits SKIN:  no  rashes or significant lesions    LABORATORY DATA:  I have reviewed the data as listed    Component Value Date/Time   NA 144 01/18/2018 0856   K 4.0 01/18/2018 0856   CL 112 (H) 01/18/2018 0856   CO2 24 01/18/2018 0856   GLUCOSE 102 (H) 01/18/2018 0856   BUN 17 01/18/2018 0856   CREATININE 0.73 01/18/2018 0856   CALCIUM 8.9 01/18/2018 0856   PROT 6.5 01/18/2018 0856   ALBUMIN 3.8 01/18/2018 0856   AST 17 01/18/2018 0856   ALT 17 01/18/2018 0856   ALKPHOS 61 01/18/2018 0856   BILITOT 0.6 01/18/2018 0856   GFRNONAA >60 01/18/2018 0856   GFRAA >60 01/18/2018 0856    No results found for: SPEP, UPEP  Lab Results  Component Value Date   WBC 5.3 01/18/2018   NEUTROABS 2.4 01/18/2018   HGB 11.2 (L) 01/18/2018   HCT 32.7 (L) 01/18/2018   MCV 95.0 01/18/2018   PLT 300 01/18/2018      Chemistry      Component Value Date/Time   NA 144 01/18/2018 0856   K 4.0 01/18/2018 0856   CL 112 (H) 01/18/2018 0856   CO2 24 01/18/2018 0856   BUN 17 01/18/2018 0856   CREATININE 0.73 01/18/2018 0856      Component Value Date/Time   CALCIUM 8.9 01/18/2018 0856   ALKPHOS 61 01/18/2018 0856   AST 17 01/18/2018 0856   ALT 17 01/18/2018 0856   BILITOT 0.6 01/18/2018 0856       RADIOGRAPHIC STUDIES: I have personally reviewed the radiological images as listed and agreed with the findings in the report. No results found.   ASSESSMENT & PLAN:  Multiple myeloma in relapse ([place]) #Multiple myeloma stage III high-risk cytogenetics; transplant eligible.  On Kyprolis-Cytoxan- dexamethasone.  Improving clinically.  Myeloma labs improving-with normalization of the kappa lambda light chain ratio.  Discussed that patient will likely need a repeat bone marrow biopsy prior to proceeding with stem cell transplant.[name]  #Proceed with Kyprolis plus Cytoxan today.  Labs are adequate.  Skeletal lesions: Continue Xgeva.  Stable.  # Infection prophylaxis: Continue acyclovir/DVT prophylaxis.  On aspirin  325 mg daily.  # follow up in 2 week/labs/Rx; 1 week/labs-Rx.    Orders Placed This Encounter  Procedures  . CBC with Differential    Standing Status:   Future    Standing Expiration Date:   01/18/2019  . CBC with Differential    Standing Status:   Future    Standing Expiration Date:   01/18/2019  . Comprehensive metabolic panel    Standing Status:   Future    Standing Expiration Date:  01/18/2019   All questions were answered. The patient knows to call the clinic with any problems, questions or concerns.      [name], MD 01/22/2018 11:02 PM

## 2018-01-19 ENCOUNTER — Inpatient Hospital Stay: Payer: Medicare HMO

## 2018-01-19 VITALS — BP 126/74 | HR 61 | Temp 96.1°F | Resp 18

## 2018-01-19 DIAGNOSIS — Z5111 Encounter for antineoplastic chemotherapy: Secondary | ICD-10-CM | POA: Diagnosis not present

## 2018-01-19 DIAGNOSIS — C9002 Multiple myeloma in relapse: Secondary | ICD-10-CM

## 2018-01-19 LAB — MULTIPLE MYELOMA PANEL, SERUM
ALBUMIN/GLOB SERPL: 1.7 (ref 0.7–1.7)
Albumin SerPl Elph-Mcnc: 3.7 g/dL (ref 2.9–4.4)
Alpha 1: 0.1 g/dL (ref 0.0–0.4)
Alpha2 Glob SerPl Elph-Mcnc: 0.6 g/dL (ref 0.4–1.0)
B-GLOBULIN SERPL ELPH-MCNC: 1.1 g/dL (ref 0.7–1.3)
Gamma Glob SerPl Elph-Mcnc: 0.4 g/dL (ref 0.4–1.8)
Globulin, Total: 2.3 g/dL (ref 2.2–3.9)
IgA: 183 mg/dL (ref 87–352)
IgG (Immunoglobin G), Serum: 505 mg/dL — ABNORMAL LOW (ref 700–1600)
M Protein SerPl Elph-Mcnc: 0.2 g/dL — ABNORMAL HIGH
TOTAL PROTEIN ELP: 6 g/dL (ref 6.0–8.5)

## 2018-01-19 LAB — KAPPA/LAMBDA LIGHT CHAINS
KAPPA FREE LGHT CHN: 7.7 mg/L (ref 3.3–19.4)
Kappa, lambda light chain ratio: 0.32 (ref 0.26–1.65)
Lambda free light chains: 24 mg/L (ref 5.7–26.3)

## 2018-01-19 MED ORDER — SODIUM CHLORIDE 0.9 % IV SOLN
Freq: Once | INTRAVENOUS | Status: AC
  Administered 2018-01-19: 09:00:00 via INTRAVENOUS
  Filled 2018-01-19: qty 1000

## 2018-01-19 MED ORDER — DEXTROSE 5 % IV SOLN
60.0000 mg | Freq: Once | INTRAVENOUS | Status: AC
  Administered 2018-01-19: 60 mg via INTRAVENOUS
  Filled 2018-01-19: qty 30

## 2018-01-19 MED ORDER — SODIUM CHLORIDE 0.9% FLUSH
10.0000 mL | INTRAVENOUS | Status: DC | PRN
  Administered 2018-01-19: 10 mL via INTRAVENOUS
  Filled 2018-01-19: qty 10

## 2018-01-19 MED ORDER — HEPARIN SOD (PORK) LOCK FLUSH 100 UNIT/ML IV SOLN
500.0000 [IU] | Freq: Once | INTRAVENOUS | Status: AC
  Administered 2018-01-19: 500 [IU] via INTRAVENOUS

## 2018-01-19 MED ORDER — DEXAMETHASONE SODIUM PHOSPHATE 10 MG/ML IJ SOLN
10.0000 mg | Freq: Once | INTRAMUSCULAR | Status: AC
  Administered 2018-01-19: 10 mg via INTRAVENOUS
  Filled 2018-01-19: qty 1

## 2018-01-25 ENCOUNTER — Inpatient Hospital Stay: Payer: Medicare HMO | Attending: Internal Medicine

## 2018-01-25 ENCOUNTER — Inpatient Hospital Stay: Payer: Medicare HMO

## 2018-01-25 VITALS — BP 135/77 | HR 63 | Temp 98.3°F | Resp 20 | Wt 169.0 lb

## 2018-01-25 DIAGNOSIS — C9002 Multiple myeloma in relapse: Secondary | ICD-10-CM

## 2018-01-25 DIAGNOSIS — Z5111 Encounter for antineoplastic chemotherapy: Secondary | ICD-10-CM | POA: Insufficient documentation

## 2018-01-25 DIAGNOSIS — Z5112 Encounter for antineoplastic immunotherapy: Secondary | ICD-10-CM | POA: Insufficient documentation

## 2018-01-25 DIAGNOSIS — Z95828 Presence of other vascular implants and grafts: Secondary | ICD-10-CM

## 2018-01-25 LAB — CBC WITH DIFFERENTIAL/PLATELET
Basophils Absolute: 0 10*3/uL (ref 0–0.1)
Basophils Relative: 1 %
Eosinophils Absolute: 0.2 10*3/uL (ref 0–0.7)
Eosinophils Relative: 3 %
HEMATOCRIT: 34 % — AB (ref 35.0–47.0)
HEMOGLOBIN: 11.3 g/dL — AB (ref 12.0–16.0)
LYMPHS ABS: 2.2 10*3/uL (ref 1.0–3.6)
LYMPHS PCT: 43 %
MCH: 32 pg (ref 26.0–34.0)
MCHC: 33.2 g/dL (ref 32.0–36.0)
MCV: 96.5 fL (ref 80.0–100.0)
Monocytes Absolute: 0.5 10*3/uL (ref 0.2–0.9)
Monocytes Relative: 10 %
NEUTROS PCT: 43 %
Neutro Abs: 2.2 10*3/uL (ref 1.4–6.5)
Platelets: 176 10*3/uL (ref 150–440)
RBC: 3.52 MIL/uL — AB (ref 3.80–5.20)
RDW: 15.3 % — ABNORMAL HIGH (ref 11.5–14.5)
WBC: 5.1 10*3/uL (ref 3.6–11.0)

## 2018-01-25 LAB — COMPREHENSIVE METABOLIC PANEL
ALT: 12 U/L (ref 0–44)
AST: 16 U/L (ref 15–41)
Albumin: 3.6 g/dL (ref 3.5–5.0)
Alkaline Phosphatase: 52 U/L (ref 38–126)
Anion gap: 9 (ref 5–15)
BUN: 18 mg/dL (ref 8–23)
CHLORIDE: 112 mmol/L — AB (ref 98–111)
CO2: 19 mmol/L — ABNORMAL LOW (ref 22–32)
Calcium: 8.5 mg/dL — ABNORMAL LOW (ref 8.9–10.3)
Creatinine, Ser: 0.79 mg/dL (ref 0.44–1.00)
Glucose, Bld: 75 mg/dL (ref 70–99)
POTASSIUM: 3.7 mmol/L (ref 3.5–5.1)
SODIUM: 140 mmol/L (ref 135–145)
Total Bilirubin: 0.4 mg/dL (ref 0.3–1.2)
Total Protein: 6.3 g/dL — ABNORMAL LOW (ref 6.5–8.1)

## 2018-01-25 MED ORDER — DEXTROSE 5 % IV SOLN
60.0000 mg | Freq: Once | INTRAVENOUS | Status: AC
  Administered 2018-01-25: 60 mg via INTRAVENOUS
  Filled 2018-01-25: qty 30

## 2018-01-25 MED ORDER — SODIUM CHLORIDE 0.9% FLUSH
10.0000 mL | Freq: Once | INTRAVENOUS | Status: AC
  Administered 2018-01-25: 10 mL via INTRAVENOUS
  Filled 2018-01-25: qty 10

## 2018-01-25 MED ORDER — SODIUM CHLORIDE 0.9 % IV SOLN
Freq: Once | INTRAVENOUS | Status: AC
  Administered 2018-01-25: 11:00:00 via INTRAVENOUS
  Filled 2018-01-25: qty 1000

## 2018-01-25 MED ORDER — PALONOSETRON HCL INJECTION 0.25 MG/5ML
0.2500 mg | Freq: Once | INTRAVENOUS | Status: AC
  Administered 2018-01-25: 0.25 mg via INTRAVENOUS
  Filled 2018-01-25: qty 5

## 2018-01-25 MED ORDER — CYCLOPHOSPHAMIDE CHEMO INJECTION 1 GM
500.0000 mg | Freq: Once | INTRAMUSCULAR | Status: AC
  Administered 2018-01-25: 500 mg via INTRAVENOUS
  Filled 2018-01-25: qty 25

## 2018-01-25 MED ORDER — DEXAMETHASONE SODIUM PHOSPHATE 10 MG/ML IJ SOLN
10.0000 mg | Freq: Once | INTRAMUSCULAR | Status: AC
  Administered 2018-01-25: 10 mg via INTRAVENOUS
  Filled 2018-01-25: qty 1

## 2018-01-25 MED ORDER — HEPARIN SOD (PORK) LOCK FLUSH 100 UNIT/ML IV SOLN
500.0000 [IU] | Freq: Once | INTRAVENOUS | Status: AC | PRN
  Administered 2018-01-25: 500 [IU]
  Filled 2018-01-25: qty 5

## 2018-01-27 ENCOUNTER — Inpatient Hospital Stay: Payer: Medicare HMO

## 2018-01-27 VITALS — BP 107/76 | HR 72 | Temp 98.0°F | Resp 20

## 2018-01-27 DIAGNOSIS — Z5111 Encounter for antineoplastic chemotherapy: Secondary | ICD-10-CM | POA: Diagnosis not present

## 2018-01-27 DIAGNOSIS — C9002 Multiple myeloma in relapse: Secondary | ICD-10-CM

## 2018-01-27 MED ORDER — SODIUM CHLORIDE 0.9 % IV SOLN
Freq: Once | INTRAVENOUS | Status: AC
  Administered 2018-01-27: 14:00:00 via INTRAVENOUS
  Filled 2018-01-27: qty 1000

## 2018-01-27 MED ORDER — CARFILZOMIB CHEMO INJECTION 60 MG
60.0000 mg | Freq: Once | INTRAVENOUS | Status: AC
  Administered 2018-01-27: 60 mg via INTRAVENOUS
  Filled 2018-01-27: qty 30

## 2018-01-27 MED ORDER — SODIUM CHLORIDE 0.9% FLUSH
10.0000 mL | INTRAVENOUS | Status: DC | PRN
Start: 2018-01-27 — End: 2018-01-27
  Administered 2018-01-27: 10 mL
  Filled 2018-01-27: qty 10

## 2018-01-27 MED ORDER — HEPARIN SOD (PORK) LOCK FLUSH 100 UNIT/ML IV SOLN
500.0000 [IU] | Freq: Once | INTRAVENOUS | Status: AC | PRN
  Administered 2018-01-27: 500 [IU]
  Filled 2018-01-27: qty 5

## 2018-02-01 ENCOUNTER — Inpatient Hospital Stay (HOSPITAL_BASED_OUTPATIENT_CLINIC_OR_DEPARTMENT_OTHER): Payer: Medicare HMO | Admitting: Internal Medicine

## 2018-02-01 ENCOUNTER — Encounter (INDEPENDENT_AMBULATORY_CARE_PROVIDER_SITE_OTHER): Payer: Self-pay

## 2018-02-01 ENCOUNTER — Inpatient Hospital Stay: Payer: Medicare HMO

## 2018-02-01 ENCOUNTER — Encounter: Payer: Self-pay | Admitting: Internal Medicine

## 2018-02-01 VITALS — BP 157/83 | HR 64 | Temp 97.1°F | Resp 16 | Wt 168.2 lb

## 2018-02-01 DIAGNOSIS — C9002 Multiple myeloma in relapse: Secondary | ICD-10-CM | POA: Diagnosis not present

## 2018-02-01 DIAGNOSIS — F17210 Nicotine dependence, cigarettes, uncomplicated: Secondary | ICD-10-CM

## 2018-02-01 DIAGNOSIS — I10 Essential (primary) hypertension: Secondary | ICD-10-CM | POA: Diagnosis not present

## 2018-02-01 DIAGNOSIS — R69 Illness, unspecified: Secondary | ICD-10-CM | POA: Diagnosis not present

## 2018-02-01 DIAGNOSIS — Z5111 Encounter for antineoplastic chemotherapy: Secondary | ICD-10-CM | POA: Diagnosis not present

## 2018-02-01 DIAGNOSIS — Z95828 Presence of other vascular implants and grafts: Secondary | ICD-10-CM

## 2018-02-01 LAB — CBC WITH DIFFERENTIAL/PLATELET
Basophils Absolute: 0 10*3/uL (ref 0–0.1)
Basophils Relative: 1 %
Eosinophils Absolute: 0.1 10*3/uL (ref 0–0.7)
Eosinophils Relative: 3 %
HEMATOCRIT: 34.3 % — AB (ref 35.0–47.0)
HEMOGLOBIN: 11.5 g/dL — AB (ref 12.0–16.0)
Lymphocytes Relative: 48 %
Lymphs Abs: 1.8 10*3/uL (ref 1.0–3.6)
MCH: 32.3 pg (ref 26.0–34.0)
MCHC: 33.7 g/dL (ref 32.0–36.0)
MCV: 96 fL (ref 80.0–100.0)
MONOS PCT: 9 %
Monocytes Absolute: 0.3 10*3/uL (ref 0.2–0.9)
NEUTROS ABS: 1.5 10*3/uL (ref 1.4–6.5)
NEUTROS PCT: 39 %
Platelets: 238 10*3/uL (ref 150–440)
RBC: 3.57 MIL/uL — AB (ref 3.80–5.20)
RDW: 15.6 % — ABNORMAL HIGH (ref 11.5–14.5)
WBC: 3.8 10*3/uL (ref 3.6–11.0)

## 2018-02-01 LAB — COMPREHENSIVE METABOLIC PANEL
ALK PHOS: 54 U/L (ref 38–126)
ALT: 14 U/L (ref 0–44)
ANION GAP: 9 (ref 5–15)
AST: 20 U/L (ref 15–41)
Albumin: 3.9 g/dL (ref 3.5–5.0)
BILIRUBIN TOTAL: 0.7 mg/dL (ref 0.3–1.2)
BUN: 15 mg/dL (ref 8–23)
CO2: 21 mmol/L — ABNORMAL LOW (ref 22–32)
CREATININE: 0.75 mg/dL (ref 0.44–1.00)
Calcium: 8.9 mg/dL (ref 8.9–10.3)
Chloride: 110 mmol/L (ref 98–111)
GFR calc Af Amer: >60 mL/min (ref >60)
GFR calc non Af Amer: >60 mL/min (ref >60)
GLUCOSE: 97 mg/dL (ref 70–99)
Potassium: 3.8 mmol/L (ref 3.5–5.1)
Sodium: 140 mmol/L (ref 135–145)
Total Protein: 6.6 g/dL (ref 6.5–8.1)

## 2018-02-01 MED ORDER — PALONOSETRON HCL INJECTION 0.25 MG/5ML
0.2500 mg | Freq: Once | INTRAVENOUS | Status: AC
  Administered 2018-02-01: 0.25 mg via INTRAVENOUS
  Filled 2018-02-01: qty 5

## 2018-02-01 MED ORDER — HEPARIN SOD (PORK) LOCK FLUSH 100 UNIT/ML IV SOLN
500.0000 [IU] | Freq: Once | INTRAVENOUS | Status: AC
  Administered 2018-02-01: 500 [IU] via INTRAVENOUS
  Filled 2018-02-01: qty 5

## 2018-02-01 MED ORDER — CARFILZOMIB CHEMO INJECTION 60 MG
60.0000 mg | Freq: Once | INTRAVENOUS | Status: AC
  Administered 2018-02-01: 60 mg via INTRAVENOUS
  Filled 2018-02-01: qty 30

## 2018-02-01 MED ORDER — DEXAMETHASONE SODIUM PHOSPHATE 10 MG/ML IJ SOLN
10.0000 mg | Freq: Once | INTRAMUSCULAR | Status: AC
  Administered 2018-02-01: 10 mg via INTRAVENOUS
  Filled 2018-02-01: qty 1

## 2018-02-01 MED ORDER — SODIUM CHLORIDE 0.9 % IV SOLN
Freq: Once | INTRAVENOUS | Status: AC
  Administered 2018-02-01: 12:00:00 via INTRAVENOUS
  Filled 2018-02-01: qty 1000

## 2018-02-01 MED ORDER — SODIUM CHLORIDE 0.9 % IV SOLN
500.0000 mg | Freq: Once | INTRAVENOUS | Status: AC
  Administered 2018-02-01: 500 mg via INTRAVENOUS
  Filled 2018-02-01: qty 25

## 2018-02-01 MED ORDER — SODIUM CHLORIDE 0.9% FLUSH
10.0000 mL | INTRAVENOUS | Status: DC | PRN
  Administered 2018-02-01: 10 mL via INTRAVENOUS
  Filled 2018-02-01: qty 10

## 2018-02-01 NOTE — Assessment & Plan Note (Addendum)
#  Multiple myeloma stage III high-risk cytogenetics; transplant eligible.  On Kyprolis-Cytoxan- dexamethasone.    # Improving clinically.  Myeloma labs improving- 0.2gm/dl;-with normalization of the kappa lambda light chain ratio.  Discussed that patient will likely need a repeat bone marrow biopsy prior to proceeding with stem cell transplant sometime mid-end of august.   #Proceed with Kyprolis plus Cytoxan today.  Labs are adequate.  Skeletal lesions: Continue [place] 6/5].  Calcium 8.9;  stable.  # Infection prophylaxis: Continue acyclovir/DVT prophylaxis.  On aspirin 325 mg daily.  # follow up in 2 week/labs/Rx.  Also add Xgeva at next visit.

## 2018-02-01 NOTE — Progress Notes (Signed)
[place] OFFICE PROGRESS NOTE  Patient Care Team: Center, [facility] as PCP - General (General Practice)  Cancer Staging No matching staging information was found for the patient.   Oncology History   # SEP 2018- MULTIPLE MYELOMA IgALamda [2.5 gm/dl; K/L= 88/1298]; STAGE III [beta 2 microglobulin=5.5] [presented with acute renal failure; anemia; NO hypercalcemia; Skeletal survey-Normal]; BMBx- 45% plasma cells; FISH-POSITIVE 11:14 translocation.[STANDARD-high RISK]/cyto-Normal; SEP 2018- PET- L3 posterior element lesion.   # 9/14- velcade SQ twice weekly/Dex 40 mg/week; OCT 5th 2018-Start R [6m]VD; 3cycles of RVD- PARTAL RESPONSE  # Jan 11th 2019-Dara-Rev-Dex; April 2019- BMBx- plasma cell -by CD-138/IHC-80% [baseline Sep 2018- 85% ]; HOLD transplant [dw Dr.Gasperatto]  # May 2019- carfil-Cyt-Dex  # Acute renal failure [Dr.Singh; Proteinuria 1.5gm/day ]; acyclovir/Asprin -------------------------------------------------------------   DIAGNOSIS: _0  MULTIPLE MYELOMA  STAGE: III/HIGH RISK ;GOALS: CONTROL  CURRENT/MOST RECENT THERAPY [ MAY 2019- KYPROLIS-CYTOXAN-DEX      [place]   11/07/2017 -  Chemotherapy    The patient had palonosetron (ALOXI) injection 0.25 mg, 0.25 mg, Intravenous,  Once, 3 of 4 cycles Administration: 0.25 mg (11/21/2017), 0.25 mg (11/29/2017), 0.25 mg (12/05/2017), 0.25 mg (12/21/2017), 0.25 mg (12/28/2017), 0.25 mg (01/04/2018), 0.25 mg (01/18/2018), 0.25 mg (01/25/2018) cyclophosphamide (CYTOXAN) 500 mg in sodium chloride 0.9 % 250 mL chemo infusion, 540 mg, Intravenous,  Once, 3 of 4 cycles Administration: 500 mg (11/21/2017), 500 mg (11/29/2017), 500 mg (12/05/2017), 500 mg (12/28/2017), 500 mg (01/04/2018), 500 mg (01/18/2018), 500 mg (01/25/2018) carfilzomib (KYPROLIS) 36 mg in dextrose 5 % 50 mL chemo infusion, 20 mg/m2 = 36 mg, Intravenous, Once, 3 of 4 cycles Administration: 36 mg (11/21/2017), 36 mg (11/22/2017), 60  mg (11/29/2017), 60 mg (11/30/2017), 60 mg (12/05/2017), 60 mg (12/06/2017), 60 mg (12/21/2017), 60 mg (12/22/2017), 60 mg (12/28/2017), 60 mg (12/29/2017), 60 mg (01/04/2018), 60 mg (01/05/2018), 60 mg (01/18/2018), 60 mg (01/19/2018), 60 mg (01/25/2018), 60 mg (01/27/2018)  for chemotherapy treatment.          INTERVAL HISTORY:  Brandi [id number].  female pleasant patient above history of relapsed multiple myeloma currently on Kyprolis-Cyt-Dex is here for follow-up.  Overall patient feels improved.  Denies any bone pain.  Appetite is good.  Gaining weight.  Denies any worsening swelling in the legs.  Review of Systems  Constitutional: Negative for chills, diaphoresis, fever, malaise/fatigue and weight loss.       Weight gain.  HENT: Negative for nosebleeds and sore throat.   Eyes: Negative for double vision.  Respiratory: Negative for cough, hemoptysis, sputum production, shortness of breath and wheezing.   Cardiovascular: Negative for chest pain, palpitations, orthopnea and leg swelling.  Gastrointestinal: Negative for abdominal pain, blood in stool, constipation, diarrhea, heartburn, melena, nausea and vomiting.  Genitourinary: Negative for dysuria, frequency and urgency.  Musculoskeletal: Negative for back pain and joint pain.  Skin: Negative.  Negative for itching and rash.  Neurological: Negative for dizziness, tingling, focal weakness, weakness and headaches.  Endo/Heme/Allergies: Does not bruise/bleed easily.  Psychiatric/Behavioral: Negative for depression. The patient is not nervous/anxious and does not have insomnia.       PAST MEDICAL HISTORY :  Past Medical History:  Diagnosis Date  . Hypertension   . [place] myel[place] ([place]     PAST SURGICAL HISTORY :   Past Surgical History:  Procedure Laterality Date  . IR FLUORO GUIDE PORT INSERTION RIGHT  12/16/2017    FAMILY HISTORY :   Family History  Problem  Relation Age of Onset  . Pneumonia Mother   . Seizures Father      SOCIAL HISTORY:   Social History   Tobacco Use  . Smoking status: Current Every Day Smoker    Packs/day: 0.50    Types: Cigarettes  . Smokeless tobacco: Never Used  Substance Use Topics  . Alcohol use: No  . Drug use: No    ALLERGIES:  has No Known Allergies.  MEDICATIONS:  Current Outpatient Medications  Medication Sig Dispense Refill  . acetaminophen (TYLENOL) 500 MG tablet Take 1 tablet (500 mg total) by mouth every 6 (six) hours as needed (TAKE 1 TABLET DAILY X 4 DAYS-STARTING 2 DAYS PRIOR TO CHEMOTHERAPY.). (Patient taking differently: Take 1,000 mg by mouth every 6 (six) hours as needed (TAKE 1 TABLET DAILY X 4 DAYS-STARTING 2 DAYS PRIOR TO CHEMOTHERAPY.). ) 30 tablet 0  . acyclovir (ZOVIRAX) 400 MG tablet One pill a day [to prevent shingles] 30 tablet 6  . albuterol (PROVENTIL HFA;VENTOLIN HFA) 108 (90 Base) MCG/ACT inhaler Inhale 2 puffs into the lungs every 6 (six) hours as needed for wheezing or shortness of breath. 1 Inhaler 2  . amLODipine (NORVASC) 10 MG tablet Take 1 tablet (10 mg total) by mouth daily. 30 tablet 4  . aspirin 325 MG tablet Take 1 tablet by mouth daily.    [name] dexamethasone (DECADRON) 4 MG tablet Take 10 tablets (40 mg total) by mouth once a week. 10 pills in AM with food. (Patient taking differently: Take 20 mg by mouth once a week. 5 pills in AM with food.) 80 tablet 2  . feeding supplement, ENSURE ENLIVE, (ENSURE ENLIVE) LIQD Take 237 mLs by mouth 2 (two) times daily between meals. 60 Bottle 0  . HYDROcodone-acetaminophen (NORCO) 5-325 MG tablet Take 1 tablet by mouth every 8 (eight) hours as needed for moderate pain. 60 tablet 0  . lidocaine-prilocaine (EMLA) cream Apply 1 application topically as needed. Apply small amount to port site at least 1 hour prior to it being accessed, cover with plastic wrap 30 g 1  . loratadine (CLARITIN) 10 MG tablet Take 1 tablet (10 mg total) by mouth daily. Take daily x 4 days (start 2 days prior to chemo) 30 tablet 3   . montelukast (SINGULAIR) 10 MG tablet Take 1 tablet (10 mg total) by mouth at bedtime. Start 2 days prior to chemo. Take for 4 days total 30 tablet 3  . pantoprazole (PROTONIX) 40 MG tablet Take 1 tablet (40 mg total) by mouth daily. 30 tablet 3  . polyethylene glycol (MIRALAX / GLYCOLAX) packet Take 17 g by mouth daily as needed for mild constipation. 14 each 0  . potassium chloride SA (K-DUR,KLOR-CON) 20 MEQ tablet Take 1 tablet (20 mEq total) by mouth 2 (two) times daily. 60 tablet 3  . ranitidine (ZANTAC) 150 MG tablet Take 1 tablet (150 mg total) by mouth 2 (two) times daily. START TAKING 2 DAYS PRIOR TO CHEMOTHERAPY. TAKE FOR A TOTAL OF 4 DAYS    . REVLIMID 20 MG capsule TAKE 1 CAPSULE BY MOUTH EVERY DAY FOR 14 DAYS 14 capsule 0  . temazepam (RESTORIL) 7.5 MG capsule Take 1 capsule (7.5 mg total) by mouth at bedtime. 30 capsule 0   No current facility-administered medications for this visit.    Facility-Administered Medications Ordered in Other Visits  Medication Dose Route Frequency Provider Last Rate Last Dose  . heparin lock flush 100 unit/mL  500 Units Intravenous Once [name], MD      .  sodium chloride flush (NS) 0.9 % injection 10 mL  10 mL Intravenous PRN [name], MD   10 mL at 02/01/18 1035    PHYSICAL EXAMINATION: ECOG PERFORMANCE STATUS: 1 - Symptomatic but completely ambulatory  BP (!) 157/83 (BP Location: Left Arm, Patient Position: Sitting)   Pulse 64   Temp (!) 97.1 F (36.2 C) (Tympanic)   Resp 16   Wt 168 lb 3.2 oz (76.3 kg)   BMI 30.76 kg/m   Filed Weights   02/01/18 1101  Weight: 168 lb 3.2 oz (76.3 kg)    GENERAL: Well-nourished well-developed; Alert, no distress and comfortable.  Alone. EYES: no pallor or icterus OROPHARYNX: no thrush or ulceration; NECK: supple; no lymph nodes felt. LYMPH:  no palpable lymphadenopathy in the axillary or inguinal regions LUNGS: Decreased breath sounds auscultation bilaterally. No wheeze or  crackles HEART/CVS: regular rate & rhythm and no murmurs; No lower extremity edema ABDOMEN:abdomen soft, non-tender and normal bowel sounds. No hepatomegaly or splenomegaly.  Musculoskeletal:no cyanosis of digits and no clubbing  PSYCH: alert & oriented x 3 with fluent speech NEURO: no focal motor/sensory deficits SKIN:  no rashes or significant lesions    LABORATORY DATA:  I have reviewed the data as listed    Component Value Date/Time   NA 140 02/01/2018 1029   K 3.8 02/01/2018 1029   CL 110 02/01/2018 1029   CO2 21 (L) 02/01/2018 1029   GLUCOSE 97 02/01/2018 1029   BUN 15 02/01/2018 1029   CREATININE 0.75 02/01/2018 1029   CALCIUM 8.9 02/01/2018 1029   PROT 6.6 02/01/2018 1029   ALBUMIN 3.9 02/01/2018 1029   AST 20 02/01/2018 1029   ALT 14 02/01/2018 1029   ALKPHOS 54 02/01/2018 1029   BILITOT 0.7 02/01/2018 1029   GFRNONAA >60 02/01/2018 1029   GFRAA >60 02/01/2018 1029    No results found for: SPEP, UPEP  Lab Results  Component Value Date   WBC 3.8 02/01/2018   NEUTROABS 1.5 02/01/2018   HGB 11.5 (L) 02/01/2018   HCT 34.3 (L) 02/01/2018   MCV 96.0 02/01/2018   PLT 238 02/01/2018      Chemistry      Component Value Date/Time   NA 140 02/01/2018 1029   K 3.8 02/01/2018 1029   CL 110 02/01/2018 1029   CO2 21 (L) 02/01/2018 1029   BUN 15 02/01/2018 1029   CREATININE 0.75 02/01/2018 1029      Component Value Date/Time   CALCIUM 8.9 02/01/2018 1029   ALKPHOS 54 02/01/2018 1029   AST 20 02/01/2018 1029   ALT 14 02/01/2018 1029   BILITOT 0.7 02/01/2018 1029       RADIOGRAPHIC STUDIES: I have personally reviewed the radiological images as listed and agreed with the findings in the report. No results found.   ASSESSMENT & PLAN:  No problem-specific Assessment & Plan notes found for this encounter.   No orders of the defined types were placed in this encounter.  All questions were answered. The patient knows to call the clinic with any problems,  questions or concerns.      [name], MD 02/01/2018 11:18 AM

## 2018-02-02 ENCOUNTER — Inpatient Hospital Stay: Payer: Medicare HMO

## 2018-02-02 DIAGNOSIS — C9002 Multiple myeloma in relapse: Secondary | ICD-10-CM

## 2018-02-02 DIAGNOSIS — Z5111 Encounter for antineoplastic chemotherapy: Secondary | ICD-10-CM | POA: Diagnosis not present

## 2018-02-02 MED ORDER — SODIUM CHLORIDE 0.9 % IV SOLN
Freq: Once | INTRAVENOUS | Status: AC
  Administered 2018-02-02: 14:00:00 via INTRAVENOUS
  Filled 2018-02-02: qty 1000

## 2018-02-02 MED ORDER — DEXTROSE 5 % IV SOLN
60.0000 mg | Freq: Once | INTRAVENOUS | Status: AC
  Administered 2018-02-02: 60 mg via INTRAVENOUS
  Filled 2018-02-02: qty 30

## 2018-02-02 MED ORDER — HEPARIN SOD (PORK) LOCK FLUSH 100 UNIT/ML IV SOLN
500.0000 [IU] | Freq: Once | INTRAVENOUS | Status: AC | PRN
  Administered 2018-02-02: 500 [IU]
  Filled 2018-02-02: qty 5

## 2018-02-04 ENCOUNTER — Telehealth: Payer: Self-pay | Admitting: Internal Medicine

## 2018-02-04 NOTE — Telephone Encounter (Signed)
Please also schedule for Xgeva; at next visit on 7/24.   Thx GB

## 2018-02-06 NOTE — Telephone Encounter (Signed)
done

## 2018-02-13 ENCOUNTER — Other Ambulatory Visit: Payer: Self-pay | Admitting: Internal Medicine

## 2018-02-13 DIAGNOSIS — C9002 Multiple myeloma in relapse: Secondary | ICD-10-CM

## 2018-02-13 NOTE — Telephone Encounter (Signed)
Dx:  Port-A-Cath in place   Ref Range & Units 12d ago  Sodium 135 - 145 mmol/L 140   Potassium 3.5 - 5.1 mmol/L 3.8

## 2018-02-15 ENCOUNTER — Inpatient Hospital Stay (HOSPITAL_BASED_OUTPATIENT_CLINIC_OR_DEPARTMENT_OTHER): Payer: Medicare HMO | Admitting: Internal Medicine

## 2018-02-15 ENCOUNTER — Inpatient Hospital Stay: Payer: Medicare HMO

## 2018-02-15 ENCOUNTER — Other Ambulatory Visit: Payer: Self-pay

## 2018-02-15 VITALS — BP 143/84 | HR 58 | Temp 97.6°F | Resp 18 | Ht 62.0 in | Wt 171.0 lb

## 2018-02-15 DIAGNOSIS — I10 Essential (primary) hypertension: Secondary | ICD-10-CM

## 2018-02-15 DIAGNOSIS — M899 Disorder of bone, unspecified: Secondary | ICD-10-CM

## 2018-02-15 DIAGNOSIS — F17210 Nicotine dependence, cigarettes, uncomplicated: Secondary | ICD-10-CM

## 2018-02-15 DIAGNOSIS — R69 Illness, unspecified: Secondary | ICD-10-CM | POA: Diagnosis not present

## 2018-02-15 DIAGNOSIS — C9002 Multiple myeloma in relapse: Secondary | ICD-10-CM

## 2018-02-15 DIAGNOSIS — Z5111 Encounter for antineoplastic chemotherapy: Secondary | ICD-10-CM | POA: Diagnosis not present

## 2018-02-15 LAB — CBC WITH DIFFERENTIAL/PLATELET
BASOS ABS: 0 10*3/uL (ref 0–0.1)
Basophils Relative: 1 %
Eosinophils Absolute: 0.1 10*3/uL (ref 0–0.7)
Eosinophils Relative: 2 %
HEMATOCRIT: 34.4 % — AB (ref 35.0–47.0)
HEMOGLOBIN: 11.5 g/dL — AB (ref 12.0–16.0)
Lymphocytes Relative: 39 %
Lymphs Abs: 2.4 10*3/uL (ref 1.0–3.6)
MCH: 32.4 pg (ref 26.0–34.0)
MCHC: 33.4 g/dL (ref 32.0–36.0)
MCV: 97 fL (ref 80.0–100.0)
Monocytes Absolute: 0.5 10*3/uL (ref 0.2–0.9)
Monocytes Relative: 8 %
NEUTROS PCT: 50 %
Neutro Abs: 3.1 10*3/uL (ref 1.4–6.5)
Platelets: 302 10*3/uL (ref 150–440)
RBC: 3.55 MIL/uL — AB (ref 3.80–5.20)
RDW: 15.5 % — ABNORMAL HIGH (ref 11.5–14.5)
WBC: 6.2 10*3/uL (ref 3.6–11.0)

## 2018-02-15 LAB — COMPREHENSIVE METABOLIC PANEL
ALT: 24 U/L (ref 0–44)
ANION GAP: 8 (ref 5–15)
AST: 29 U/L (ref 15–41)
Albumin: 4.2 g/dL (ref 3.5–5.0)
Alkaline Phosphatase: 56 U/L (ref 38–126)
BILIRUBIN TOTAL: 0.5 mg/dL (ref 0.3–1.2)
BUN: 17 mg/dL (ref 8–23)
CO2: 23 mmol/L (ref 22–32)
Calcium: 8.5 mg/dL — ABNORMAL LOW (ref 8.9–10.3)
Chloride: 111 mmol/L (ref 98–111)
Creatinine, Ser: 0.69 mg/dL (ref 0.44–1.00)
GFR calc Af Amer: >60 mL/min (ref >60)
GFR calc non Af Amer: >60 mL/min (ref >60)
Glucose, Bld: 100 mg/dL — ABNORMAL HIGH (ref 70–99)
Potassium: 3.3 mmol/L — ABNORMAL LOW (ref 3.5–5.1)
SODIUM: 142 mmol/L (ref 135–145)
TOTAL PROTEIN: 6.8 g/dL (ref 6.5–8.1)

## 2018-02-15 MED ORDER — SODIUM CHLORIDE 0.9 % IV SOLN
Freq: Once | INTRAVENOUS | Status: AC
  Administered 2018-02-15: 11:00:00 via INTRAVENOUS
  Filled 2018-02-15: qty 1000

## 2018-02-15 MED ORDER — DEXAMETHASONE SODIUM PHOSPHATE 10 MG/ML IJ SOLN
10.0000 mg | Freq: Once | INTRAMUSCULAR | Status: AC
  Administered 2018-02-15: 10 mg via INTRAVENOUS
  Filled 2018-02-15: qty 1

## 2018-02-15 MED ORDER — SODIUM CHLORIDE 0.9 % IV SOLN
Freq: Once | INTRAVENOUS | Status: AC
  Administered 2018-02-15: 10:00:00 via INTRAVENOUS
  Filled 2018-02-15: qty 1000

## 2018-02-15 MED ORDER — SODIUM CHLORIDE 0.9 % IV SOLN
500.0000 mg | Freq: Once | INTRAVENOUS | Status: AC
  Administered 2018-02-15: 500 mg via INTRAVENOUS
  Filled 2018-02-15: qty 25

## 2018-02-15 MED ORDER — HEPARIN SOD (PORK) LOCK FLUSH 100 UNIT/ML IV SOLN
500.0000 [IU] | Freq: Once | INTRAVENOUS | Status: AC | PRN
  Administered 2018-02-15: 500 [IU]

## 2018-02-15 MED ORDER — CARFILZOMIB CHEMO INJECTION 60 MG
60.0000 mg | Freq: Once | INTRAVENOUS | Status: AC
  Administered 2018-02-15: 60 mg via INTRAVENOUS
  Filled 2018-02-15: qty 30

## 2018-02-15 MED ORDER — PALONOSETRON HCL INJECTION 0.25 MG/5ML
0.2500 mg | Freq: Once | INTRAVENOUS | Status: AC
  Administered 2018-02-15: 0.25 mg via INTRAVENOUS
  Filled 2018-02-15: qty 5

## 2018-02-15 MED ORDER — DENOSUMAB 120 MG/1.7ML ~~LOC~~ SOLN
120.0000 mg | Freq: Once | SUBCUTANEOUS | Status: AC
  Administered 2018-02-15: 120 mg via SUBCUTANEOUS
  Filled 2018-02-15: qty 1.7

## 2018-02-15 NOTE — Progress Notes (Signed)
[place] OFFICE PROGRESS NOTE  Patient Care Team: Center, [facility] as PCP - General (General Practice)  Cancer Staging No matching staging information was found for the patient.   Oncology History   # SEP 2018- MULTIPLE MYELOMA IgALamda [2.5 gm/dl; K/L= 88/1298]; STAGE III [beta 2 microglobulin=5.5] [presented with acute renal failure; anemia; NO hypercalcemia; Skeletal survey-Normal]; BMBx- 45% plasma cells; FISH-POSITIVE 11:14 translocation.[STANDARD-high RISK]/cyto-Normal; SEP 2018- PET- L3 posterior element lesion.   # 9/14- velcade SQ twice weekly/Dex 40 mg/week; OCT 5th 2018-Start R ['10mg'$ ]VD; 3cycles of RVD- PARTAL RESPONSE  # Jan 11th 2019-Dara-Rev-Dex; April 2019- BMBx- plasma cell -by CD-138/IHC-80% [baseline Sep 2018- 85% ]; HOLD transplant [dw Dr.Gasperatto]  # April 29th 2019 2019- carfil-Cyt-Dex  # Acute renal failure [Dr.Singh; Proteinuria 1.5gm/day ]; acyclovir/Asprin -------------------------------------------------------------   DIAGNOSIS: '[ ]'$  MULTIPLE MYELOMA  STAGE: III/HIGH RISK ;GOALS: CONTROL  CURRENT/MOST RECENT THERAPY [ April 29th 2019- KYPROLIS-CYTOXAN-DEX      Multiple myeloma in relapse ([place])   11/07/2017 -  Chemotherapy    The patient had palonosetron (ALOXI) injection 0.25 mg, 0.25 mg, Intravenous,  Once, 4 of 6 cycles Administration: 0.25 mg (11/21/2017), 0.25 mg (11/29/2017), 0.25 mg (12/05/2017), 0.25 mg (12/21/2017), 0.25 mg (12/28/2017), 0.25 mg (01/04/2018), 0.25 mg (01/18/2018), 0.25 mg (01/25/2018), 0.25 mg (02/01/2018), 0.25 mg (02/15/2018) cyclophosphamide (CYTOXAN) 500 mg in sodium chloride 0.9 % 250 mL chemo infusion, 540 mg, Intravenous,  Once, 4 of 6 cycles Administration: 500 mg (11/21/2017), 500 mg (11/29/2017), 500 mg (12/05/2017), 500 mg (12/28/2017), 500 mg (01/04/2018), 500 mg (01/18/2018), 500 mg (01/25/2018), 500 mg (02/01/2018), 500 mg (02/15/2018) carfilzomib (KYPROLIS) 36 mg in dextrose 5 % 50 mL chemo infusion, 20 mg/m2  = 36 mg, Intravenous, Once, 4 of 6 cycles Administration: 36 mg (11/21/2017), 36 mg (11/22/2017), 60 mg (11/29/2017), 60 mg (11/30/2017), 60 mg (12/05/2017), 60 mg (12/06/2017), 60 mg (12/21/2017), 60 mg (12/22/2017), 60 mg (12/28/2017), 60 mg (12/29/2017), 60 mg (01/04/2018), 60 mg (01/05/2018), 60 mg (01/18/2018), 60 mg (01/19/2018), 60 mg (01/25/2018), 60 mg (01/27/2018), 60 mg (02/01/2018), 60 mg (02/02/2018), 60 mg (02/15/2018), 60 mg (02/16/2018)  for chemotherapy treatment.          INTERVAL HISTORY:  Brandi [name] 67 y.o.  female pleasant patient above history of multiple myeloma/transplant eligible-currently on Cytoxan Dex Revlimid is here for follow-up  Patient's appetite is good.  She is gaining weight.  Denies any swelling in the legs.  Denies any shortness of breath or cough.  Denies any pain.  Denies any skin rash.  No tingling or numbness.  Review of Systems  Constitutional: Negative for chills, diaphoresis, fever, malaise/fatigue and weight loss.  HENT: Negative for nosebleeds and sore throat.   Eyes: Negative for double vision.  Respiratory: Negative for cough, hemoptysis, sputum production, shortness of breath and wheezing.   Cardiovascular: Negative for chest pain, palpitations, orthopnea and leg swelling.  Gastrointestinal: Negative for abdominal pain, blood in stool, constipation, diarrhea, heartburn, melena, nausea and vomiting.  Genitourinary: Negative for dysuria, frequency and urgency.  Musculoskeletal: Negative for back pain and joint pain.  Skin: Negative.  Negative for itching and rash.  Neurological: Negative for dizziness, tingling, focal weakness, weakness and headaches.  Endo/Heme/Allergies: Does not bruise/bleed easily.  Psychiatric/Behavioral: Negative for depression. The patient is not nervous/anxious and does not have insomnia.       PAST MEDICAL HISTORY :  Past Medical History:  Diagnosis Date  . Hypertension   . Multiple myeloma (H[facility])     PAST  SURGICAL HISTORY :    Past Surgical History:  Procedure Laterality Date  . IR FLUORO GUIDE PORT INSERTION RIGHT  12/16/2017    FAMILY HISTORY :   Family History  Problem Relation Age of Onset  . Pneumonia Mother   . Seizures Father     SOCIAL HISTORY:   Social History   Tobacco Use  . Smoking status: Current Every Day Smoker    Packs/day: 0.50    Types: Cigarettes  . Smokeless tobacco: Never Used  Substance Use Topics  . Alcohol use: No  . Drug use: No    ALLERGIES:  has No Known Allergies.  MEDICATIONS:  Current Outpatient Medications  Medication Sig Dispense Refill  . acetaminophen (TYLENOL) 500 MG tablet Take 1 tablet (500 mg total) by mouth every 6 (six) hours as needed (TAKE 1 TABLET DAILY X 4 DAYS-STARTING 2 DAYS PRIOR TO CHEMOTHERAPY.). (Patient taking differently: Take 1,000 mg by mouth every 6 (six) hours as needed (TAKE 1 TABLET DAILY X 4 DAYS-STARTING 2 DAYS PRIOR TO CHEMOTHERAPY.). ) 30 tablet 0  . acyclovir (ZOVIRAX) 400 MG tablet One pill a day [to prevent shingles] 30 tablet 6  . albuterol (PROVENTIL HFA;VENTOLIN HFA) 108 (90 Base) MCG/ACT inhaler Inhale 2 puffs into the lungs every 6 (six) hours as needed for wheezing or shortness of breath. 1 Inhaler 2  . amLODipine (NORVASC) 10 MG tablet Take 1 tablet (10 mg total) by mouth daily. 30 tablet 4  . aspirin 325 MG tablet Take 1 tablet by mouth daily.    [name] dexamethasone (DECADRON) 4 MG tablet Take 10 tablets (40 mg total) by mouth once a week. 10 pills in AM with food. 80 tablet 2  . feeding supplement, ENSURE ENLIVE, (ENSURE ENLIVE) LIQD Take 237 mLs by mouth 2 (two) times daily between meals. 60 Bottle 0  . lidocaine-prilocaine (EMLA) cream Apply 1 application topically as needed. Apply small amount to port site at least 1 hour prior to it being accessed, cover with plastic wrap 30 g 1  . loratadine (CLARITIN) 10 MG tablet Take 1 tablet (10 mg total) by mouth daily. Take daily x 4 days (start 2 days prior to chemo) 30 tablet 3  .  montelukast (SINGULAIR) 10 MG tablet Take 1 tablet (10 mg total) by mouth at bedtime. Start 2 days prior to chemo. Take for 4 days total 30 tablet 3  . pantoprazole (PROTONIX) 40 MG tablet Take 1 tablet (40 mg total) by mouth daily. 30 tablet 3  . potassium chloride SA (K-DUR,KLOR-CON) 20 MEQ tablet TAKE 1 TABLET BY MOUTH TWICE DAILY 180 tablet 1  . ranitidine (ZANTAC) 150 MG tablet Take 1 tablet (150 mg total) by mouth 2 (two) times daily. START TAKING 2 DAYS PRIOR TO CHEMOTHERAPY. TAKE FOR A TOTAL OF 4 DAYS    . HYDROcodone-acetaminophen (NORCO) 5-325 MG tablet Take 1 tablet by mouth every 8 (eight) hours as needed for moderate pain. (Patient not taking: Reported on 02/15/2018) 60 tablet 0  . polyethylene glycol (MIRALAX / GLYCOLAX) packet Take 17 g by mouth daily as needed for mild constipation. (Patient not taking: Reported on 02/15/2018) 14 each 0   No current facility-administered medications for this visit.     PHYSICAL EXAMINATION: ECOG PERFORMANCE STATUS: 0 - Asymptomatic  BP (!) 143/84 (BP Location: Left Arm, Patient Position: Sitting)   Pulse (!) 58   Temp 97.6 F (36.4 C) (Tympanic)   Resp 18   Ht '5\' 2"'$  (1.575 m)   Wt  171 lb (77.6 kg)   BMI 31.28 kg/m   Filed Weights   02/15/18 0926  Weight: 171 lb (77.6 kg)    GENERAL: Well-nourished well-developed; Alert, no distress and comfortable.  Alone.  EYES: no pallor or icterus OROPHARYNX: no thrush or ulceration; NECK: supple; no lymph nodes felt. LYMPH:  no palpable lymphadenopathy in the axillary or inguinal regions LUNGS: Decreased breath sounds auscultation bilaterally. No wheeze or crackles HEART/CVS: regular rate & rhythm and no murmurs; No lower extremity edema ABDOMEN:abdomen soft, non-tender and normal bowel sounds. No hepatomegaly or splenomegaly.  Musculoskeletal:no cyanosis of digits and no clubbing  PSYCH: alert & oriented x 3 with fluent speech NEURO: no focal motor/sensory deficits SKIN:  no rashes or  significant lesions    LABORATORY DATA:  I have reviewed the data as listed    Component Value Date/Time   NA 142 02/15/2018 0851   K 3.3 (L) 02/15/2018 0851   CL 111 02/15/2018 0851   CO2 23 02/15/2018 0851   GLUCOSE 100 (H) 02/15/2018 0851   BUN 17 02/15/2018 0851   CREATININE 0.69 02/15/2018 0851   CALCIUM 8.5 (L) 02/15/2018 0851   PROT 6.8 02/15/2018 0851   ALBUMIN 4.2 02/15/2018 0851   AST 29 02/15/2018 0851   ALT 24 02/15/2018 0851   ALKPHOS 56 02/15/2018 0851   BILITOT 0.5 02/15/2018 0851   GFRNONAA >60 02/15/2018 0851   GFRAA >60 02/15/2018 0851    No results found for: SPEP, UPEP  Lab Results  Component Value Date   WBC 6.2 02/15/2018   NEUTROABS 3.1 02/15/2018   HGB 11.5 (L) 02/15/2018   HCT 34.4 (L) 02/15/2018   MCV 97.0 02/15/2018   PLT 302 02/15/2018      Chemistry      Component Value Date/Time   NA 142 02/15/2018 0851   K 3.3 (L) 02/15/2018 0851   CL 111 02/15/2018 0851   CO2 23 02/15/2018 0851   BUN 17 02/15/2018 0851   CREATININE 0.69 02/15/2018 0851      Component Value Date/Time   CALCIUM 8.5 (L) 02/15/2018 0851   ALKPHOS 56 02/15/2018 0851   AST 29 02/15/2018 0851   ALT 24 02/15/2018 0851   BILITOT 0.5 02/15/2018 0851       RADIOGRAPHIC STUDIES: I have personally reviewed the radiological images as listed and agreed with the findings in the report. No results found.   ASSESSMENT & PLAN:  Multiple myeloma in relapse ([place]) #Multiple myeloma stage III high-risk cytogenetics; transplant eligible.  On Kyprolis-Cytoxan- dexamethasone; IMPROVING  #  Myeloma labs improving- 0.2gm/dl;-with normalization of the kappa lambda light chain ratio.  Plan repeat BMBx in mid-late AUG 2019.  #Proceed with Kyprolis plus Cytoxan today.  Labs are adequate.  # Skeletal lesions: Continue Xgeva [l7/24].  Calcium 8.9;  STABLE. [name]  # Infection prophylaxis: Continue acyclovir/DVT prophylaxis.  On aspirin 325 mg daily.STABLE.   # follow up in 2  week/labs/Rx; MM labs.    Orders Placed This Encounter  Procedures  . CBC with Differential/Platelet    Standing Status:   Future    Standing Expiration Date:   02/16/2019  . CBC with Differential/Platelet    Standing Status:   Future    Standing Expiration Date:   02/16/2019  . Comprehensive metabolic panel    Standing Status:   Future    Standing Expiration Date:   02/16/2019  . Multiple Myeloma Panel (SPEP&IFE w/QIG)    Standing Status:   Future  Standing Expiration Date:   02/16/2019  . Kappa/lambda light chains    Standing Status:   Future    Standing Expiration Date:   02/16/2019   All questions were answered. The patient knows to call the clinic with any problems, questions or concerns.      [name], MD 02/17/2018 7:58 AM

## 2018-02-15 NOTE — Assessment & Plan Note (Addendum)
#  Multiple myeloma stage III high-risk cytogenetics; transplant eligible.  On Kyprolis-Cytoxan- dexamethasone; IMPROVING  #  Myeloma labs improving- 0.2gm/dl;-with normalization of the kappa lambda light chain ratio.  Plan repeat BMBx in mid-late AUG 2019.  #Proceed with Kyprolis plus Cytoxan today.  Labs are adequate.  # Skeletal lesions: Continue Xgeva [l7/24].  Calcium 8.9;  STABLE. [name]  # Infection prophylaxis: Continue acyclovir/DVT prophylaxis.  On aspirin 325 mg daily.STABLE.   # follow up in 2 week/labs/Rx; MM labs.

## 2018-02-16 ENCOUNTER — Inpatient Hospital Stay: Payer: Medicare HMO

## 2018-02-16 VITALS — BP 133/82 | HR 64 | Temp 97.7°F | Resp 18

## 2018-02-16 DIAGNOSIS — C9002 Multiple myeloma in relapse: Secondary | ICD-10-CM

## 2018-02-16 DIAGNOSIS — Z5111 Encounter for antineoplastic chemotherapy: Secondary | ICD-10-CM | POA: Diagnosis not present

## 2018-02-16 MED ORDER — DEXAMETHASONE SODIUM PHOSPHATE 10 MG/ML IJ SOLN
10.0000 mg | Freq: Once | INTRAMUSCULAR | Status: AC
  Administered 2018-02-16: 10 mg via INTRAVENOUS
  Filled 2018-02-16: qty 1

## 2018-02-16 MED ORDER — DEXTROSE 5 % IV SOLN
60.0000 mg | Freq: Once | INTRAVENOUS | Status: AC
  Administered 2018-02-16: 60 mg via INTRAVENOUS
  Filled 2018-02-16: qty 30

## 2018-02-16 MED ORDER — SODIUM CHLORIDE 0.9% FLUSH
10.0000 mL | INTRAVENOUS | Status: DC | PRN
  Filled 2018-02-16: qty 10

## 2018-02-16 MED ORDER — SODIUM CHLORIDE 0.9 % IV SOLN
Freq: Once | INTRAVENOUS | Status: AC
  Administered 2018-02-16: 14:00:00 via INTRAVENOUS
  Filled 2018-02-16: qty 1000

## 2018-02-16 MED ORDER — HEPARIN SOD (PORK) LOCK FLUSH 100 UNIT/ML IV SOLN
500.0000 [IU] | Freq: Once | INTRAVENOUS | Status: AC | PRN
  Administered 2018-02-16: 500 [IU]
  Filled 2018-02-16: qty 5

## 2018-02-16 MED ORDER — SODIUM CHLORIDE 0.9 % IV SOLN
Freq: Once | INTRAVENOUS | Status: AC
Start: 2018-02-16 — End: 2018-02-16
  Administered 2018-02-16: 15:00:00 via INTRAVENOUS
  Filled 2018-02-16: qty 1000

## 2018-02-22 ENCOUNTER — Inpatient Hospital Stay: Payer: Medicare HMO

## 2018-02-22 VITALS — BP 120/73 | HR 65 | Temp 97.0°F | Resp 18

## 2018-02-22 DIAGNOSIS — C9002 Multiple myeloma in relapse: Secondary | ICD-10-CM

## 2018-02-22 DIAGNOSIS — Z5111 Encounter for antineoplastic chemotherapy: Secondary | ICD-10-CM | POA: Diagnosis not present

## 2018-02-22 LAB — COMPREHENSIVE METABOLIC PANEL
ALK PHOS: 50 U/L (ref 38–126)
ALT: 13 U/L (ref 0–44)
ANION GAP: 9 (ref 5–15)
AST: 18 U/L (ref 15–41)
Albumin: 4 g/dL (ref 3.5–5.0)
BUN: 18 mg/dL (ref 8–23)
CALCIUM: 9 mg/dL (ref 8.9–10.3)
CO2: 24 mmol/L (ref 22–32)
CREATININE: 0.75 mg/dL (ref 0.44–1.00)
Chloride: 108 mmol/L (ref 98–111)
GFR calc Af Amer: >60 mL/min (ref >60)
GFR calc non Af Amer: >60 mL/min (ref >60)
Glucose, Bld: 101 mg/dL — ABNORMAL HIGH (ref 70–99)
Potassium: 3.8 mmol/L (ref 3.5–5.1)
SODIUM: 141 mmol/L (ref 135–145)
Total Bilirubin: 0.7 mg/dL (ref 0.3–1.2)
Total Protein: 7 g/dL (ref 6.5–8.1)

## 2018-02-22 LAB — CBC WITH DIFFERENTIAL/PLATELET
Basophils Absolute: 0 10*3/uL (ref 0–0.1)
Basophils Relative: 0 %
EOS PCT: 3 %
Eosinophils Absolute: 0.2 10*3/uL (ref 0–0.7)
HEMATOCRIT: 36.3 % (ref 35.0–47.0)
HEMOGLOBIN: 12.3 g/dL (ref 12.0–16.0)
LYMPHS ABS: 1.7 10*3/uL (ref 1.0–3.6)
LYMPHS PCT: 33 %
MCH: 32.7 pg (ref 26.0–34.0)
MCHC: 33.8 g/dL (ref 32.0–36.0)
MCV: 96.7 fL (ref 80.0–100.0)
Monocytes Absolute: 0.5 10*3/uL (ref 0.2–0.9)
Monocytes Relative: 10 %
NEUTROS ABS: 2.8 10*3/uL (ref 1.4–6.5)
NEUTROS PCT: 54 %
Platelets: 206 10*3/uL (ref 150–440)
RBC: 3.75 MIL/uL — ABNORMAL LOW (ref 3.80–5.20)
RDW: 15.4 % — ABNORMAL HIGH (ref 11.5–14.5)
WBC: 5.3 10*3/uL (ref 3.6–11.0)

## 2018-02-22 MED ORDER — DEXAMETHASONE SODIUM PHOSPHATE 10 MG/ML IJ SOLN
10.0000 mg | Freq: Once | INTRAMUSCULAR | Status: AC
  Administered 2018-02-22: 10 mg via INTRAVENOUS
  Filled 2018-02-22: qty 1

## 2018-02-22 MED ORDER — HEPARIN SOD (PORK) LOCK FLUSH 100 UNIT/ML IV SOLN
500.0000 [IU] | Freq: Once | INTRAVENOUS | Status: AC
  Administered 2018-02-22: 500 [IU] via INTRAVENOUS
  Filled 2018-02-22: qty 5

## 2018-02-22 MED ORDER — PALONOSETRON HCL INJECTION 0.25 MG/5ML
0.2500 mg | Freq: Once | INTRAVENOUS | Status: AC
  Administered 2018-02-22: 0.25 mg via INTRAVENOUS
  Filled 2018-02-22: qty 5

## 2018-02-22 MED ORDER — SODIUM CHLORIDE 0.9 % IV SOLN
Freq: Once | INTRAVENOUS | Status: AC
  Administered 2018-02-22: 09:00:00 via INTRAVENOUS
  Filled 2018-02-22: qty 1000

## 2018-02-22 MED ORDER — DEXTROSE 5 % IV SOLN
60.0000 mg | Freq: Once | INTRAVENOUS | Status: AC
  Administered 2018-02-22: 60 mg via INTRAVENOUS
  Filled 2018-02-22: qty 30

## 2018-02-22 MED ORDER — SODIUM CHLORIDE 0.9 % IV SOLN
500.0000 mg | Freq: Once | INTRAVENOUS | Status: AC
  Administered 2018-02-22: 500 mg via INTRAVENOUS
  Filled 2018-02-22: qty 25

## 2018-02-22 MED ORDER — SODIUM CHLORIDE 0.9% FLUSH
10.0000 mL | INTRAVENOUS | Status: DC | PRN
  Administered 2018-02-22: 10 mL via INTRAVENOUS
  Filled 2018-02-22: qty 10

## 2018-02-23 ENCOUNTER — Inpatient Hospital Stay: Payer: Medicare HMO | Attending: Internal Medicine

## 2018-02-23 VITALS — BP 111/68 | HR 68 | Temp 99.4°F | Resp 20

## 2018-02-23 DIAGNOSIS — Z5111 Encounter for antineoplastic chemotherapy: Secondary | ICD-10-CM | POA: Insufficient documentation

## 2018-02-23 DIAGNOSIS — C9002 Multiple myeloma in relapse: Secondary | ICD-10-CM | POA: Insufficient documentation

## 2018-02-23 DIAGNOSIS — Z5112 Encounter for antineoplastic immunotherapy: Secondary | ICD-10-CM | POA: Diagnosis present

## 2018-02-23 MED ORDER — DEXTROSE 5 % IV SOLN
60.0000 mg | Freq: Once | INTRAVENOUS | Status: AC
  Administered 2018-02-23: 60 mg via INTRAVENOUS
  Filled 2018-02-23: qty 30

## 2018-02-23 MED ORDER — SODIUM CHLORIDE 0.9 % IV SOLN
Freq: Once | INTRAVENOUS | Status: AC
  Administered 2018-02-23: 14:00:00 via INTRAVENOUS
  Filled 2018-02-23: qty 1000

## 2018-02-23 MED ORDER — HEPARIN SOD (PORK) LOCK FLUSH 100 UNIT/ML IV SOLN
500.0000 [IU] | Freq: Once | INTRAVENOUS | Status: AC | PRN
  Administered 2018-02-23: 500 [IU]

## 2018-02-23 MED ORDER — DEXTROSE 5 % IV SOLN: 60.0000 mg | Freq: Once | INTRAVENOUS | Status: DC

## 2018-02-23 MED ORDER — SODIUM CHLORIDE 0.9 % IV SOLN
Freq: Once | INTRAVENOUS | Status: AC
  Administered 2018-02-23: 15:00:00 via INTRAVENOUS
  Filled 2018-02-23: qty 1000

## 2018-02-23 MED ORDER — HEPARIN SOD (PORK) LOCK FLUSH 100 UNIT/ML IV SOLN
INTRAVENOUS | Status: AC
  Filled 2018-02-23: qty 5

## 2018-02-23 MED ORDER — SODIUM CHLORIDE 0.9% FLUSH
10.0000 mL | INTRAVENOUS | Status: DC | PRN
  Administered 2018-02-23: 10 mL
  Filled 2018-02-23: qty 10

## 2018-02-23 MED ORDER — DEXTROSE 5 % IV SOLN: 36.0000 mg/m2 | Freq: Once | INTRAVENOUS | Status: DC

## 2018-03-01 ENCOUNTER — Inpatient Hospital Stay: Payer: Medicare HMO

## 2018-03-01 ENCOUNTER — Telehealth: Payer: Self-pay | Admitting: *Deleted

## 2018-03-01 ENCOUNTER — Inpatient Hospital Stay (HOSPITAL_BASED_OUTPATIENT_CLINIC_OR_DEPARTMENT_OTHER): Payer: Medicare HMO | Admitting: Internal Medicine

## 2018-03-01 VITALS — BP 131/84 | HR 77 | Temp 97.8°F | Resp 16 | Wt 173.4 lb

## 2018-03-01 DIAGNOSIS — C9002 Multiple myeloma in relapse: Secondary | ICD-10-CM

## 2018-03-01 DIAGNOSIS — I10 Essential (primary) hypertension: Secondary | ICD-10-CM | POA: Diagnosis not present

## 2018-03-01 DIAGNOSIS — M899 Disorder of bone, unspecified: Secondary | ICD-10-CM | POA: Diagnosis not present

## 2018-03-01 DIAGNOSIS — R69 Illness, unspecified: Secondary | ICD-10-CM | POA: Diagnosis not present

## 2018-03-01 DIAGNOSIS — Z5111 Encounter for antineoplastic chemotherapy: Secondary | ICD-10-CM | POA: Diagnosis not present

## 2018-03-01 DIAGNOSIS — F17210 Nicotine dependence, cigarettes, uncomplicated: Secondary | ICD-10-CM | POA: Diagnosis not present

## 2018-03-01 DIAGNOSIS — Z5112 Encounter for antineoplastic immunotherapy: Secondary | ICD-10-CM | POA: Diagnosis not present

## 2018-03-01 LAB — CBC WITH DIFFERENTIAL/PLATELET
Basophils Absolute: 0 10*3/uL (ref 0–0.1)
Basophils Relative: 0 %
EOS PCT: 3 %
Eosinophils Absolute: 0.1 10*3/uL (ref 0–0.7)
HCT: 34.7 % — ABNORMAL LOW (ref 35.0–47.0)
Hemoglobin: 11.8 g/dL — ABNORMAL LOW (ref 12.0–16.0)
LYMPHS ABS: 1.6 10*3/uL (ref 1.0–3.6)
Lymphocytes Relative: 31 %
MCH: 32.5 pg (ref 26.0–34.0)
MCHC: 33.9 g/dL (ref 32.0–36.0)
MCV: 95.8 fL (ref 80.0–100.0)
MONO ABS: 0.4 10*3/uL (ref 0.2–0.9)
MONOS PCT: 8 %
Neutro Abs: 3 10*3/uL (ref 1.4–6.5)
Neutrophils Relative %: 58 %
PLATELETS: 223 10*3/uL (ref 150–440)
RBC: 3.62 MIL/uL — AB (ref 3.80–5.20)
RDW: 15.2 % — ABNORMAL HIGH (ref 11.5–14.5)
WBC: 5.2 10*3/uL (ref 3.6–11.0)

## 2018-03-01 LAB — COMPREHENSIVE METABOLIC PANEL
ALBUMIN: 3.9 g/dL (ref 3.5–5.0)
ALT: 17 U/L (ref 0–44)
AST: 18 U/L (ref 15–41)
Alkaline Phosphatase: 55 U/L (ref 38–126)
Anion gap: 9 (ref 5–15)
BUN: 19 mg/dL (ref 8–23)
CHLORIDE: 110 mmol/L (ref 98–111)
CO2: 22 mmol/L (ref 22–32)
Calcium: 8.6 mg/dL — ABNORMAL LOW (ref 8.9–10.3)
Creatinine, Ser: 0.75 mg/dL (ref 0.44–1.00)
GFR calc Af Amer: >60 mL/min (ref >60)
GLUCOSE: 98 mg/dL (ref 70–99)
Potassium: 3.6 mmol/L (ref 3.5–5.1)
Sodium: 141 mmol/L (ref 135–145)
Total Bilirubin: 0.3 mg/dL (ref 0.3–1.2)
Total Protein: 6.6 g/dL (ref 6.5–8.1)

## 2018-03-01 MED ORDER — SODIUM CHLORIDE 0.9 % IV SOLN
500.0000 mg | Freq: Once | INTRAVENOUS | Status: AC
  Administered 2018-03-01: 500 mg via INTRAVENOUS
  Filled 2018-03-01: qty 25

## 2018-03-01 MED ORDER — DEXTROSE 5 % IV SOLN
60.0000 mg | Freq: Once | INTRAVENOUS | Status: AC
  Administered 2018-03-01: 60 mg via INTRAVENOUS
  Filled 2018-03-01: qty 30

## 2018-03-01 MED ORDER — SODIUM CHLORIDE 0.9 % IV SOLN
Freq: Once | INTRAVENOUS | Status: AC
  Administered 2018-03-01: 10:00:00 via INTRAVENOUS
  Filled 2018-03-01: qty 1000

## 2018-03-01 MED ORDER — HEPARIN SOD (PORK) LOCK FLUSH 100 UNIT/ML IV SOLN: 500.0000 [IU] | Freq: Once | INTRAVENOUS | Status: DC | PRN

## 2018-03-01 MED ORDER — SODIUM CHLORIDE 0.9% FLUSH
10.0000 mL | INTRAVENOUS | Status: DC | PRN
  Administered 2018-03-01: 10 mL via INTRAVENOUS
  Filled 2018-03-01: qty 10

## 2018-03-01 MED ORDER — HEPARIN SOD (PORK) LOCK FLUSH 100 UNIT/ML IV SOLN
500.0000 [IU] | Freq: Once | INTRAVENOUS | Status: AC
  Administered 2018-03-01: 500 [IU] via INTRAVENOUS
  Filled 2018-03-01: qty 5

## 2018-03-01 MED ORDER — DEXAMETHASONE SODIUM PHOSPHATE 10 MG/ML IJ SOLN
10.0000 mg | Freq: Once | INTRAMUSCULAR | Status: AC
  Administered 2018-03-01: 10 mg via INTRAVENOUS
  Filled 2018-03-01: qty 1

## 2018-03-01 MED ORDER — PALONOSETRON HCL INJECTION 0.25 MG/5ML
0.2500 mg | Freq: Once | INTRAVENOUS | Status: AC
  Administered 2018-03-01: 0.25 mg via INTRAVENOUS
  Filled 2018-03-01: qty 5

## 2018-03-01 NOTE — Assessment & Plan Note (Addendum)
#  Multiple myeloma stage III high-risk cytogenetics; transplant eligible.  On Kyprolis-Cytoxan- dexamethasone; improving  #  Myeloma labs improving-June 0.2gm/dl;-with normalization of the kappa lambda light chain ratio.  Bone marrow biopsy ordered today.  Discussed that if bone marrow biopsy showed good response would recommend reevaluation for stem cell transplant.  #Proceed with Kyprolis plus Cytoxan today.  Labs are adequate.  # Skeletal lesions: Continue Xgeva [l7/24].  Calcium 8.9; stable  # Infection prophylaxis: Continue acyclovir/DVT prophylaxis.  On aspirin 325 mg daily.stable  # follow up in 2 week/labs/; bone marrow biopsy prior.

## 2018-03-01 NOTE — Telephone Encounter (Signed)
Bone marrow biopsy on 03/07/18- arrival time 7;30 am npo after midnight.  Left vm for patient requesting call back to confirm this msg was received

## 2018-03-01 NOTE — Progress Notes (Signed)
[place] OFFICE PROGRESS NOTE  Patient Care Team: Center, [facility] as PCP - General (General Practice)  Cancer Staging No matching staging information was found for the patient.   Oncology History   # SEP 2018- MULTIPLE MYELOMA IgALamda [2.5 gm/dl; K/L= 88/1298]; STAGE III [beta 2 microglobulin=5.5] [presented with acute renal failure; anemia; NO hypercalcemia; Skeletal survey-Normal]; BMBx- 45% plasma cells; FISH-POSITIVE 11:14 translocation.[STANDARD-high RISK]/cyto-Normal; SEP 2018- PET- L3 posterior element lesion.   # 9/14- velcade SQ twice weekly/Dex 40 mg/week; OCT 5th 2018-Start R [43m]VD; 3cycles of RVD- PARTAL RESPONSE  # Jan 11th 2019-Dara-Rev-Dex; April 2019- BMBx- plasma cell -by CD-138/IHC-80% [baseline Sep 2018- 85% ]; HOLD transplant [dw Dr.Gasperatto]  # April 29th 2019 2019- carfil-Cyt-Dex  # Acute renal failure [Dr.Singh; Proteinuria 1.5gm/day ]; acyclovir/Asprin -------------------------------------------------------------   DIAGNOSIS: '[ ]'  MULTIPLE MYELOMA  STAGE: III/HIGH RISK ;GOALS: CONTROL  CURRENT/MOST RECENT THERAPY [ April 29th 2019- KYPROLIS-CYTOXAN-DEX      Multiple myeloma in relapse ([place]   11/07/2017 -  Chemotherapy    The patient had palonosetron (ALOXI) injection 0.25 mg, 0.25 mg, Intravenous,  Once, 4 of 6 cycles Administration: 0.25 mg (11/21/2017), 0.25 mg (11/29/2017), 0.25 mg (12/05/2017), 0.25 mg (12/21/2017), 0.25 mg (12/28/2017), 0.25 mg (01/04/2018), 0.25 mg (01/18/2018), 0.25 mg (01/25/2018), 0.25 mg (02/01/2018), 0.25 mg (02/15/2018), 0.25 mg (02/22/2018), 0.25 mg (03/01/2018) cyclophosphamide (CYTOXAN) 500 mg in sodium chloride 0.9 % 250 mL chemo infusion, 540 mg, Intravenous,  Once, 4 of 6 cycles Administration: 500 mg (11/21/2017), 500 mg (11/29/2017), 500 mg (12/05/2017), 500 mg (12/28/2017), 500 mg (01/04/2018), 500 mg (01/18/2018), 500 mg (01/25/2018), 500 mg (02/01/2018), 500 mg (02/15/2018), 500 mg (02/22/2018), 500 mg  (03/01/2018) carfilzomib (KYPROLIS) 36 mg in dextrose 5 % 50 mL chemo infusion, 20 mg/m2 = 36 mg, Intravenous, Once, 4 of 6 cycles Administration: 36 mg (11/21/2017), 36 mg (11/22/2017), 60 mg (11/29/2017), 60 mg (11/30/2017), 60 mg (12/05/2017), 60 mg (12/06/2017), 60 mg (12/21/2017), 60 mg (12/22/2017), 60 mg (12/28/2017), 60 mg (12/29/2017), 60 mg (01/04/2018), 60 mg (01/05/2018), 60 mg (01/18/2018), 60 mg (01/19/2018), 60 mg (01/25/2018), 60 mg (01/27/2018), 60 mg (02/01/2018), 60 mg (02/02/2018), 60 mg (02/15/2018), 60 mg (02/16/2018), 60 mg (02/22/2018), 60 mg (03/01/2018), 60 mg (03/02/2018)  for chemotherapy treatment.        INTERVAL HISTORY:  Brandi QUESADA89y.o.  female pleasant patient above history of relapsed multiple myeloma currently on Kyprolis Cytoxan is here for follow-up.  Patient admits to improve appetite.  Complains of weight gain.  No swelling in the legs.  Patient steroids.  No tingling or numbness no shortness of breath.  Review of Systems  Constitutional: Negative for chills, diaphoresis, fever, malaise/fatigue and weight loss.  HENT: Negative for nosebleeds and sore throat.   Eyes: Negative for double vision.  Respiratory: Negative for cough, hemoptysis, sputum production, shortness of breath and wheezing.   Cardiovascular: Negative for chest pain, palpitations, orthopnea and leg swelling.  Gastrointestinal: Negative for abdominal pain, blood in stool, constipation, diarrhea, heartburn, melena, nausea and vomiting.  Genitourinary: Negative for dysuria, frequency and urgency.  Musculoskeletal: Negative for back pain and joint pain.  Skin: Negative.  Negative for itching and rash.  Neurological: Negative for dizziness, tingling, focal weakness, weakness and headaches.  Endo/Heme/Allergies: Does not bruise/bleed easily.  Psychiatric/Behavioral: Negative for depression. The patient is not nervous/anxious and does not have insomnia.       PAST MEDICAL HISTORY :  Past Medical History:   Diagnosis Date  . Hypertension   .  Multiple myeloma ([place])     PAST SURGICAL HISTORY :   Past Surgical History:  Procedure Laterality Date  . IR FLUORO GUIDE PORT INSERTION RIGHT  12/16/2017    FAMILY HISTORY :   Family History  Problem Relation Age of Onset  . Pneumonia Mother   . Seizures Father     SOCIAL HISTORY:   Social History   Tobacco Use  . Smoking status: Current Every Day Smoker    Packs/day: 0.50    Types: Cigarettes  . Smokeless tobacco: Never Used  Substance Use Topics  . Alcohol use: No  . Drug use: No    ALLERGIES:  has No Known Allergies.  MEDICATIONS:  Current Outpatient Medications  Medication Sig Dispense Refill  . acetaminophen (TYLENOL) 500 MG tablet Take 1 tablet (500 mg total) by mouth every 6 (six) hours as needed (TAKE 1 TABLET DAILY X 4 DAYS-STARTING 2 DAYS PRIOR TO CHEMOTHERAPY.). (Patient taking differently: Take 1,000 mg by mouth every 6 (six) hours as needed (TAKE 1 TABLET DAILY X 4 DAYS-STARTING 2 DAYS PRIOR TO CHEMOTHERAPY.). ) 30 tablet 0  . acyclovir (ZOVIRAX) 400 MG tablet One pill a day [to prevent shingles] 30 tablet 6  . albuterol (PROVENTIL HFA;VENTOLIN HFA) 108 (90 Base) MCG/ACT inhaler Inhale 2 puffs into the lungs every 6 (six) hours as needed for wheezing or shortness of breath. 1 Inhaler 2  . amLODipine (NORVASC) 10 MG tablet Take 1 tablet (10 mg total) by mouth daily. 30 tablet 4  . aspirin 325 MG tablet Take 1 tablet by mouth daily.    [name] dexamethasone (DECADRON) 4 MG tablet Take 10 tablets (40 mg total) by mouth once a week. 10 pills in AM with food. 80 tablet 2  . feeding supplement, ENSURE ENLIVE, (ENSURE ENLIVE) LIQD Take 237 mLs by mouth 2 (two) times daily between meals. 60 Bottle 0  . HYDROcodone-acetaminophen (NORCO) 5-325 MG tablet Take 1 tablet by mouth every 8 (eight) hours as needed for moderate pain. 60 tablet 0  . lidocaine-prilocaine (EMLA) cream Apply 1 application topically as needed. Apply small amount to  port site at least 1 hour prior to it being accessed, cover with plastic wrap 30 g 1  . loratadine (CLARITIN) 10 MG tablet Take 1 tablet (10 mg total) by mouth daily. Take daily x 4 days (start 2 days prior to chemo) 30 tablet 3  . montelukast (SINGULAIR) 10 MG tablet Take 1 tablet (10 mg total) by mouth at bedtime. Start 2 days prior to chemo. Take for 4 days total 30 tablet 3  . pantoprazole (PROTONIX) 40 MG tablet Take 1 tablet (40 mg total) by mouth daily. 30 tablet 3  . polyethylene glycol (MIRALAX / GLYCOLAX) packet Take 17 g by mouth daily as needed for mild constipation. 14 each 0  . potassium chloride SA (K-DUR,KLOR-CON) 20 MEQ tablet TAKE 1 TABLET BY MOUTH TWICE DAILY 180 tablet 1  . ranitidine (ZANTAC) 150 MG tablet Take 1 tablet (150 mg total) by mouth 2 (two) times daily. START TAKING 2 DAYS PRIOR TO CHEMOTHERAPY. TAKE FOR A TOTAL OF 4 DAYS     No current facility-administered medications for this visit.    Facility-Administered Medications Ordered in Other Visits  Medication Dose Route Frequency Provider Last Rate Last Dose  . sodium chloride flush (NS) 0.9 % injection 10 mL  10 mL Intracatheter PRN [name], MD   10 mL at 02/23/18 1400  . sodium chloride flush (NS) 0.9 % injection  10 mL  10 mL Intravenous PRN [name], MD   10 mL at 03/02/18 1405    PHYSICAL EXAMINATION: ECOG PERFORMANCE STATUS: 0 - Asymptomatic  BP 131/84 (BP Location: Left Arm, Patient Position: Sitting)   Pulse 77   Temp 97.8 F (36.6 C) (Tympanic)   Resp 16   Wt 173 lb 6.4 oz (78.7 kg)   BMI 31.72 kg/m   Filed Weights   03/01/18 0928  Weight: 173 lb 6.4 oz (78.7 kg)    GENERAL: Well-nourished well-developed; Alert, no distress and comfortable.  Alone.  EYES: no pallor or icterus OROPHARYNX: no thrush or ulceration; NECK: supple; no lymph nodes felt. LYMPH:  no palpable lymphadenopathy in the axillary or inguinal regions LUNGS: Decreased breath sounds auscultation  bilaterally. No wheeze or crackles HEART/CVS: regular rate & rhythm and no murmurs; No lower extremity edema ABDOMEN:abdomen soft, non-tender and normal bowel sounds. No hepatomegaly or splenomegaly.  Musculoskeletal:no cyanosis of digits and no clubbing  PSYCH: alert & oriented x 3 with fluent speech NEURO: no focal motor/sensory deficits SKIN:  no rashes or significant lesions    LABORATORY DATA:  I have reviewed the data as listed    Component Value Date/Time   NA 141 03/01/2018 0908   K 3.6 03/01/2018 0908   CL 110 03/01/2018 0908   CO2 22 03/01/2018 0908   GLUCOSE 98 03/01/2018 0908   BUN 19 03/01/2018 0908   CREATININE 0.75 03/01/2018 0908   CALCIUM 8.6 (L) 03/01/2018 0908   PROT 6.6 03/01/2018 0908   ALBUMIN 3.9 03/01/2018 0908   AST 18 03/01/2018 0908   ALT 17 03/01/2018 0908   ALKPHOS 55 03/01/2018 0908   BILITOT 0.3 03/01/2018 0908   GFRNONAA >60 03/01/2018 0908   GFRAA >60 03/01/2018 0908    No results found for: SPEP, UPEP  Lab Results  Component Value Date   WBC 5.2 03/01/2018   NEUTROABS 3.0 03/01/2018   HGB 11.8 (L) 03/01/2018   HCT 34.7 (L) 03/01/2018   MCV 95.8 03/01/2018   PLT 223 03/01/2018      Chemistry      Component Value Date/Time   NA 141 03/01/2018 0908   K 3.6 03/01/2018 0908   CL 110 03/01/2018 0908   CO2 22 03/01/2018 0908   BUN 19 03/01/2018 0908   CREATININE 0.75 03/01/2018 0908      Component Value Date/Time   CALCIUM 8.6 (L) 03/01/2018 0908   ALKPHOS 55 03/01/2018 0908   AST 18 03/01/2018 0908   ALT 17 03/01/2018 0908   BILITOT 0.3 03/01/2018 0908       RADIOGRAPHIC STUDIES: I have personally reviewed the radiological images as listed and agreed with the findings in the report. No results found.   ASSESSMENT & PLAN:  Multiple myeloma in relapse ([place]) #Multiple myeloma stage III high-risk cytogenetics; transplant eligible.  On Kyprolis-Cytoxan- dexamethasone; improving  #  Myeloma labs improving-June  0.2gm/dl;-with normalization of the kappa lambda light chain ratio.  Bone marrow biopsy ordered today.  Discussed that if bone marrow biopsy showed good response would recommend reevaluation for stem cell transplant.  #Proceed with Kyprolis plus Cytoxan today.  Labs are adequate.  # Skeletal lesions: Continue Xgeva [l7/24].  Calcium 8.9; stable  # Infection prophylaxis: Continue acyclovir/DVT prophylaxis.  On aspirin 325 mg daily.stable  # follow up in 2 week/labs/; bone marrow biopsy prior.   Orders Placed This Encounter  Procedures  . CT BIOPSY    Bone marrow biopsy/aspiration  Comprehensive  morphologic evaluation of bone marrow biopsy/ aspirate; flow cytometry; cytogenetics; others- as deemed appropriate by the pathologist. Please call me- if any question- at [phone].    Standing Status:   Future    Standing Expiration Date:   03/01/2019    Order Specific Question:   Lab orders requested (DO NOT place separate lab orders, these will be automatically ordered during procedure specimen collection):    Answer:   Other    Comments:   hemato-pathology    Order Specific Question:   Reason for Exam (SYMPTOM  OR DIAGNOSIS REQUIRED)    Answer:   multiple myeloma on chemo    Order Specific Question:   Preferred imaging location?    Answer:   [facility]   All questions were answered. The patient knows to call the clinic with any problems, questions or concerns.      [name], MD 03/02/2018 7:02 PM

## 2018-03-02 ENCOUNTER — Inpatient Hospital Stay: Payer: Medicare HMO

## 2018-03-02 VITALS — BP 123/74 | HR 80 | Temp 97.8°F | Resp 18

## 2018-03-02 DIAGNOSIS — C9002 Multiple myeloma in relapse: Secondary | ICD-10-CM | POA: Diagnosis not present

## 2018-03-02 DIAGNOSIS — Z5112 Encounter for antineoplastic immunotherapy: Secondary | ICD-10-CM | POA: Diagnosis not present

## 2018-03-02 DIAGNOSIS — Z5111 Encounter for antineoplastic chemotherapy: Secondary | ICD-10-CM | POA: Diagnosis not present

## 2018-03-02 LAB — KAPPA/LAMBDA LIGHT CHAINS
Kappa free light chain: 4 mg/L (ref 3.3–19.4)
Kappa, lambda light chain ratio: 0.4 (ref 0.26–1.65)
LAMDA FREE LIGHT CHAINS: 10.1 mg/L (ref 5.7–26.3)

## 2018-03-02 MED ORDER — DEXTROSE 5 % IV SOLN
60.0000 mg | Freq: Once | INTRAVENOUS | Status: AC
  Administered 2018-03-02: 60 mg via INTRAVENOUS
  Filled 2018-03-02: qty 30

## 2018-03-02 MED ORDER — HEPARIN SOD (PORK) LOCK FLUSH 100 UNIT/ML IV SOLN
500.0000 [IU] | Freq: Once | INTRAVENOUS | Status: AC
  Administered 2018-03-02: 500 [IU] via INTRAVENOUS

## 2018-03-02 MED ORDER — SODIUM CHLORIDE 0.9 % IV SOLN
Freq: Once | INTRAVENOUS | Status: AC
  Administered 2018-03-02: 15:00:00 via INTRAVENOUS
  Filled 2018-03-02: qty 1000

## 2018-03-02 MED ORDER — SODIUM CHLORIDE 0.9% FLUSH
10.0000 mL | INTRAVENOUS | Status: DC | PRN
  Administered 2018-03-02: 10 mL via INTRAVENOUS
  Filled 2018-03-02: qty 10

## 2018-03-02 MED ORDER — SODIUM CHLORIDE 0.9 % IV SOLN
Freq: Once | INTRAVENOUS | Status: AC
  Administered 2018-03-02: 14:00:00 via INTRAVENOUS
  Filled 2018-03-02: qty 1000

## 2018-03-02 NOTE — Telephone Encounter (Signed)
Left vm for patient to return my phone call.- 1315 pm

## 2018-03-02 NOTE — Telephone Encounter (Signed)
Spoke with patient before she was d/c from her infusion suite today 1319- Patient gave verbal understanding of the plan of care. Agreeable to bone marrow biopsy. Teach back process performed with instructions to be npo after midnight and also arrival at 7:30 am. Pt instructed to bring a driver.

## 2018-03-03 LAB — MULTIPLE MYELOMA PANEL, SERUM
ALBUMIN SERPL ELPH-MCNC: 3.6 g/dL (ref 2.9–4.4)
ALPHA 1: 0.1 g/dL (ref 0.0–0.4)
Albumin/Glob SerPl: 1.6 (ref 0.7–1.7)
Alpha2 Glob SerPl Elph-Mcnc: 0.8 g/dL (ref 0.4–1.0)
B-Globulin SerPl Elph-Mcnc: 1.1 g/dL (ref 0.7–1.3)
GAMMA GLOB SERPL ELPH-MCNC: 0.4 g/dL (ref 0.4–1.8)
GLOBULIN, TOTAL: 2.4 g/dL (ref 2.2–3.9)
IGA: 99 mg/dL (ref 87–352)
IgG (Immunoglobin G), Serum: 458 mg/dL — ABNORMAL LOW (ref 700–1600)
IgM (Immunoglobulin M), Srm: 5 mg/dL — ABNORMAL LOW (ref 26–217)
M PROTEIN SERPL ELPH-MCNC: 0.2 g/dL — AB
Total Protein ELP: 6 g/dL (ref 6.0–8.5)

## 2018-03-07 ENCOUNTER — Other Ambulatory Visit: Payer: Self-pay | Admitting: Student

## 2018-03-07 ENCOUNTER — Ambulatory Visit
Admission: RE | Admit: 2018-03-07 | Discharge: 2018-03-07 | Disposition: A | Discharge status: Home / Self Care | Payer: Medicare HMO | Source: Ambulatory Visit | Attending: Internal Medicine | Admitting: Internal Medicine

## 2018-03-07 ENCOUNTER — Other Ambulatory Visit (HOSPITAL_COMMUNITY)
Admission: RE | Admit: 2018-03-07 | Disposition: A | Discharge status: Home / Self Care | Payer: Medicare HMO | Source: Ambulatory Visit | Attending: Internal Medicine | Admitting: Internal Medicine

## 2018-03-07 DIAGNOSIS — F17210 Nicotine dependence, cigarettes, uncomplicated: Secondary | ICD-10-CM | POA: Diagnosis not present

## 2018-03-07 DIAGNOSIS — R0602 Shortness of breath: Secondary | ICD-10-CM | POA: Insufficient documentation

## 2018-03-07 DIAGNOSIS — Z79899 Other long term (current) drug therapy: Secondary | ICD-10-CM | POA: Insufficient documentation

## 2018-03-07 DIAGNOSIS — Z7952 Long term (current) use of systemic steroids: Secondary | ICD-10-CM | POA: Insufficient documentation

## 2018-03-07 DIAGNOSIS — C9002 Multiple myeloma in relapse: Secondary | ICD-10-CM

## 2018-03-07 DIAGNOSIS — R69 Illness, unspecified: Secondary | ICD-10-CM | POA: Diagnosis not present

## 2018-03-07 DIAGNOSIS — Z79891 Long term (current) use of opiate analgesic: Secondary | ICD-10-CM | POA: Diagnosis not present

## 2018-03-07 DIAGNOSIS — I10 Essential (primary) hypertension: Secondary | ICD-10-CM | POA: Diagnosis not present

## 2018-03-07 DIAGNOSIS — Z9889 Other specified postprocedural states: Secondary | ICD-10-CM | POA: Diagnosis not present

## 2018-03-07 DIAGNOSIS — Z7982 Long term (current) use of aspirin: Secondary | ICD-10-CM | POA: Diagnosis not present

## 2018-03-07 DIAGNOSIS — C9000 Multiple myeloma not having achieved remission: Secondary | ICD-10-CM | POA: Diagnosis not present

## 2018-03-07 DIAGNOSIS — D649 Anemia, unspecified: Secondary | ICD-10-CM | POA: Diagnosis not present

## 2018-03-07 DIAGNOSIS — D72822 Plasmacytosis: Secondary | ICD-10-CM | POA: Diagnosis not present

## 2018-03-07 DIAGNOSIS — Z7983 Long term (current) use of bisphosphonates: Secondary | ICD-10-CM | POA: Diagnosis not present

## 2018-03-07 LAB — CBC WITH DIFFERENTIAL/PLATELET
BASOS PCT: 0 %
Basophils Absolute: 0 10*3/uL (ref 0–0.1)
EOS ABS: 0.1 10*3/uL (ref 0–0.7)
Eosinophils Relative: 2 %
HEMATOCRIT: 32.9 % — AB (ref 35.0–47.0)
HEMOGLOBIN: 11.1 g/dL — AB (ref 12.0–16.0)
Lymphocytes Relative: 37 %
Lymphs Abs: 2.8 10*3/uL (ref 1.0–3.6)
MCH: 32.1 pg (ref 26.0–34.0)
MCHC: 33.7 g/dL (ref 32.0–36.0)
MCV: 95.5 fL (ref 80.0–100.0)
MONOS PCT: 7 %
Monocytes Absolute: 0.5 10*3/uL (ref 0.2–0.9)
NEUTROS ABS: 4 10*3/uL (ref 1.4–6.5)
NEUTROS PCT: 54 %
Platelets: 245 10*3/uL (ref 150–440)
RBC: 3.45 MIL/uL — ABNORMAL LOW (ref 3.80–5.20)
RDW: 15.4 % — AB (ref 11.5–14.5)
WBC: 7.5 10*3/uL (ref 3.6–11.0)

## 2018-03-07 LAB — PROTIME-INR
INR: 0.92
PROTHROMBIN TIME: 12.3 s (ref 11.4–15.2)

## 2018-03-07 MED ORDER — HEPARIN SOD (PORK) LOCK FLUSH 100 UNIT/ML IV SOLN
500.0000 [IU] | Freq: Once | INTRAVENOUS | Status: AC
  Administered 2018-03-07: 500 [IU] via INTRAVENOUS
  Filled 2018-03-07: qty 5

## 2018-03-07 MED ORDER — FENTANYL CITRATE (PF) 100 MCG/2ML IJ SOLN
INTRAMUSCULAR | Status: AC
  Filled 2018-03-07: qty 2

## 2018-03-07 MED ORDER — LIDOCAINE HCL (PF) 1 % IJ SOLN
10.0000 mL | Freq: Once | INTRAMUSCULAR | Status: AC
  Administered 2018-03-07: 10 mL via INTRADERMAL
  Filled 2018-03-07: qty 10

## 2018-03-07 MED ORDER — HEPARIN SOD (PORK) LOCK FLUSH 100 UNIT/ML IV SOLN
INTRAVENOUS | Status: AC
  Filled 2018-03-07: qty 5

## 2018-03-07 MED ORDER — MIDAZOLAM HCL 5 MG/5ML IJ SOLN
INTRAMUSCULAR | Status: AC | PRN
  Administered 2018-03-07: 1 mg via INTRAVENOUS

## 2018-03-07 MED ORDER — HYDROCODONE-ACETAMINOPHEN 5-325 MG PO TABS: 1.0000 | ORAL_TABLET | ORAL | Status: DC | PRN

## 2018-03-07 MED ORDER — MIDAZOLAM HCL 5 MG/5ML IJ SOLN
INTRAMUSCULAR | Status: AC
  Filled 2018-03-07: qty 5

## 2018-03-07 MED ORDER — HEPARIN SOD (PORK) LOCK FLUSH 100 UNIT/ML IV SOLN
INTRAVENOUS | Status: AC
  Administered 2018-03-07: 500 [IU] via INTRAVENOUS
  Filled 2018-03-07: qty 5

## 2018-03-07 MED ORDER — FENTANYL CITRATE (PF) 100 MCG/2ML IJ SOLN
INTRAMUSCULAR | Status: AC | PRN
  Administered 2018-03-07: 50 ug via INTRAVENOUS

## 2018-03-07 NOTE — Procedures (Signed)
CT guided bone marrow biopsy from right ilium. 2 aspirates and 2 cores.  Minimal blood loss and no immediate complication.  See full report in Imaging section.

## 2018-03-07 NOTE — H&P (Signed)
Chief Complaint: Patient was seen in consultation today for CT-guided bone marrow biopsy  Referring Physician(s): [name],[name]  Patient Status: [facility] - Out-pt  History of Present Illness: Brandi [name] is a 67 y.o. female with multiple myeloma who is known to the Interventional Radiology service.  She had a previous bone marrow biopsy on 10/31/2017.  Bone marrow biopsy has been requested to follow-up the myeloma.  Patient has no complaints today.  She has some chronic shortness of breath but this has not recently changed.  She denies chest pain, neurologic symptoms, GI problems or GU problems.  Past Medical History:  Diagnosis Date  . Hypertension   . Multiple myeloma [facility])     Past Surgical History:  Procedure Laterality Date  . IR FLUORO GUIDE PORT INSERTION RIGHT  12/16/2017    Allergies: Patient has no known allergies.  Medications: Prior to Admission medications   Medication Sig Start Date End Date Taking? Authorizing Provider  acetaminophen (TYLENOL) 500 MG tablet Take 1 tablet (500 mg total) by mouth every 6 (six) hours as needed (TAKE 1 TABLET DAILY X 4 DAYS-STARTING 2 DAYS PRIOR TO CHEMOTHERAPY.). Patient taking differently: Take 1,000 mg by mouth every 6 (six) hours as needed (TAKE 1 TABLET DAILY X 4 DAYS-STARTING 2 DAYS PRIOR TO CHEMOTHERAPY.).  08/01/17  Yes [name], MD  acyclovir (ZOVIRAX) 400 MG tablet One pill a day [to prevent shingles] 09/01/17  Yes [name], MD  amLODipine (NORVASC) 10 MG tablet Take 1 tablet (10 mg total) by mouth daily. 12/30/17  Yes Verlon Au, NP  aspirin 325 MG tablet Take 1 tablet by mouth daily.   Yes [provider]  loratadine (CLARITIN) 10 MG tablet Take 1 tablet (10 mg total) by mouth daily. Take daily x 4 days (start 2 days prior to chemo) 08/01/17  Yes [name], MD  montelukast (SINGULAIR) 10 MG tablet Take 1 tablet (10 mg total) by mouth at bedtime. Start 2 days prior to  chemo. Take for 4 days total 08/01/17  Yes [name], MD  pantoprazole (PROTONIX) 40 MG tablet Take 1 tablet (40 mg total) by mouth daily. 11/02/17  Yes [name], MD  potassium chloride SA (K-DUR,KLOR-CON) 20 MEQ tablet TAKE 1 TABLET BY MOUTH TWICE DAILY 02/14/18  Yes [name], MD  ranitidine (ZANTAC) 150 MG tablet Take 1 tablet (150 mg total) by mouth 2 (two) times daily. START TAKING 2 DAYS PRIOR TO CHEMOTHERAPY. TAKE FOR A TOTAL OF 4 DAYS 08/01/17  Yes [name], MD  albuterol (PROVENTIL HFA;VENTOLIN HFA) 108 (90 Base) MCG/ACT inhaler Inhale 2 puffs into the lungs every 6 (six) hours as needed for wheezing or shortness of breath. 12/28/17   [name], MD  dexamethasone (DECADRON) 4 MG tablet Take 10 tablets (40 mg total) by mouth once a week. 10 pills in AM with food. 11/17/17   [name], MD  feeding supplement, ENSURE ENLIVE, (ENSURE ENLIVE) LIQD Take 237 mLs by mouth 2 (two) times daily between meals. 04/12/17   Nicholes Mango, MD  HYDROcodone-acetaminophen (NORCO) 5-325 MG tablet Take 1 tablet by mouth every 8 (eight) hours as needed for moderate pain. 12/06/17   [name], MD  lidocaine-prilocaine (EMLA) cream Apply 1 application topically as needed. Apply small amount to port site at least 1 hour prior to it being accessed, cover with plastic wrap 12/16/17   [name], MD  polyethylene glycol (MIRALAX / Floria Raveling)  packet Take 17 g by mouth daily as needed for mild constipation. 04/11/17   Nicholes Mango, MD     Family History  Problem Relation Age of Onset  . Pneumonia Mother   . Seizures Father     Social History   Socioeconomic History  . Marital status: Single    Spouse name: Not on file  . Number of children: Not on file  . Years of education: Not on file  . Highest education level: Not on file  Occupational History  . Not on file  Social Needs  . Financial resource strain: Not on file  . Food  insecurity:    Worry: Not on file    Inability: Not on file  . Transportation needs:    Medical: Not on file    Non-medical: Not on file  Tobacco Use  . Smoking status: Current Every Day Smoker    Packs/day: 0.50    Types: Cigarettes  . Smokeless tobacco: Never Used  Substance and Sexual Activity  . Alcohol use: No  . Drug use: No  . Sexual activity: Not on file  Lifestyle  . Physical activity:    Days per week: Not on file    Minutes per session: Not on file  . Stress: Not on file  Relationships  . Social connections:    Talks on phone: Not on file    Gets together: Not on file    Attends religious service: Not on file    Active member of club or organization: Not on file    Attends meetings of clubs or organizations: Not on file    Relationship status: Not on file  Other Topics Concern  . Not on file  Social History Narrative   Lives at home with family. Independent at baseline      Review of Systems: A 12 point ROS discussed and pertinent positives are indicated in the HPI above.  All other systems are negative.  Review of Systems  Respiratory: Positive for shortness of breath.     Vital Signs: BP (!) 158/88   Pulse (!) 55   Temp 98.6 F (37 C) (Oral)   Resp 13   Ht '5\' 2"'  (1.575 m)   Wt 78.7 kg   SpO2 98%   BMI 31.73 kg/m   Physical Exam  Constitutional: No distress.  HENT:  Mouth/Throat: Oropharynx is clear and moist.  Cardiovascular: Normal rate, regular rhythm and normal heart sounds.  Pulmonary/Chest: Effort normal and breath sounds normal.  Abdominal: Soft. Bowel sounds are normal.  Vitals reviewed.   Imaging: No results found.  Labs:  CBC: Recent Labs    02/15/18 0851 02/22/18 0857 03/01/18 0908 03/07/18 0756  WBC 6.2 5.3 5.2 7.5  HGB 11.5* 12.3 11.8* 11.1*  HCT 34.4* 36.3 34.7* 32.9*  PLT 302 206 223 245    COAGS: Recent Labs    04/07/17 1140 04/10/17 0623 10/31/17 0807 12/16/17 0700 03/07/18 0756  INR 1.09 1.11 0.87  0.86 0.92  APTT 38*  --   --  28  --     BMP: Recent Labs    02/01/18 1029 02/15/18 0851 02/22/18 0857 03/01/18 0908  NA 140 142 141 141  K 3.8 3.3* 3.8 3.6  CL 110 111 108 110  CO2 21* '23 24 22  ' GLUCOSE 97 100* 101* 98  BUN '15 17 18 19  ' CALCIUM 8.9 8.5* 9.0 8.6*  CREATININE 0.75 0.69 0.75 0.75  GFRNONAA >60 >60 >60 >60  GFRAA >60 >  60 >60 >60    LIVER FUNCTION TESTS: Recent Labs    02/01/18 1029 02/15/18 0851 02/22/18 0857 03/01/18 0908  BILITOT 0.7 0.5 0.7 0.3  AST '20 29 18 18  ' ALT '14 24 13 17  ' ALKPHOS 54 56 50 55  PROT 6.6 6.8 7.0 6.6  ALBUMIN 3.9 4.2 4.0 3.9    TUMOR MARKERS: No results for input(s): AFPTM, CEA, CA199, CHROMGRNA in the last 8760 hours.  Assessment and Plan:  67 year old with multiple myeloma.  Patient presents for a follow-up CT-guided bone marrow biopsy.  Patient is very familiar with the procedure having gone through it previously.  The risks including bleeding and infection were discussed with the patient.  Informed consent was obtained for CT-guided bone marrow biopsy with moderate sedation.  Thank you for this interesting consult.  I greatly enjoyed meeting GENAVIEVE MANGIAPANE and look forward to participating in their care.  A copy of this report was sent to the requesting provider on this date.  Electronically Signed: Burman Riis, MD 03/07/2018, 8:30 AM   I spent a total of    10 Minutes in face to face in clinical consultation, greater than 50% of which was counseling/coordinating care for CT-guided bone marrow biopsy.

## 2018-03-14 ENCOUNTER — Other Ambulatory Visit: Payer: Self-pay | Admitting: Internal Medicine

## 2018-03-14 DIAGNOSIS — C9002 Multiple myeloma in relapse: Secondary | ICD-10-CM

## 2018-03-15 ENCOUNTER — Inpatient Hospital Stay: Payer: Medicare HMO

## 2018-03-15 ENCOUNTER — Telehealth: Payer: Self-pay | Admitting: Internal Medicine

## 2018-03-15 ENCOUNTER — Inpatient Hospital Stay (HOSPITAL_BASED_OUTPATIENT_CLINIC_OR_DEPARTMENT_OTHER): Payer: Medicare HMO | Admitting: Internal Medicine

## 2018-03-15 VITALS — BP 146/88 | HR 65 | Temp 97.1°F | Resp 16 | Wt 173.4 lb

## 2018-03-15 DIAGNOSIS — C9002 Multiple myeloma in relapse: Secondary | ICD-10-CM | POA: Diagnosis not present

## 2018-03-15 DIAGNOSIS — Z5111 Encounter for antineoplastic chemotherapy: Secondary | ICD-10-CM | POA: Diagnosis not present

## 2018-03-15 DIAGNOSIS — M25551 Pain in right hip: Secondary | ICD-10-CM

## 2018-03-15 DIAGNOSIS — Z5112 Encounter for antineoplastic immunotherapy: Secondary | ICD-10-CM | POA: Diagnosis not present

## 2018-03-15 DIAGNOSIS — M899 Disorder of bone, unspecified: Secondary | ICD-10-CM | POA: Diagnosis not present

## 2018-03-15 DIAGNOSIS — Z7982 Long term (current) use of aspirin: Secondary | ICD-10-CM | POA: Diagnosis not present

## 2018-03-15 LAB — COMPREHENSIVE METABOLIC PANEL
ALT: 14 U/L (ref 0–44)
AST: 20 U/L (ref 15–41)
Albumin: 4.1 g/dL (ref 3.5–5.0)
Alkaline Phosphatase: 59 U/L (ref 38–126)
Anion gap: 6 (ref 5–15)
BUN: 15 mg/dL (ref 8–23)
CO2: 20 mmol/L — AB (ref 22–32)
Calcium: 8.6 mg/dL — ABNORMAL LOW (ref 8.9–10.3)
Chloride: 114 mmol/L — ABNORMAL HIGH (ref 98–111)
Creatinine, Ser: 0.69 mg/dL (ref 0.44–1.00)
GFR calc non Af Amer: >60 mL/min (ref >60)
Glucose, Bld: 96 mg/dL (ref 70–99)
Potassium: 3.8 mmol/L (ref 3.5–5.1)
SODIUM: 140 mmol/L (ref 135–145)
Total Bilirubin: 0.6 mg/dL (ref 0.3–1.2)
Total Protein: 6.6 g/dL (ref 6.5–8.1)

## 2018-03-15 LAB — CBC WITH DIFFERENTIAL/PLATELET
Basophils Absolute: 0.1 10*3/uL (ref 0–0.1)
Basophils Relative: 1 %
Eosinophils Absolute: 0.2 10*3/uL (ref 0–0.7)
Eosinophils Relative: 3 %
HEMATOCRIT: 35.2 % (ref 35.0–47.0)
HEMOGLOBIN: 12 g/dL (ref 12.0–16.0)
Lymphocytes Relative: 32 %
Lymphs Abs: 1.8 10*3/uL (ref 1.0–3.6)
MCH: 32.4 pg (ref 26.0–34.0)
MCHC: 33.9 g/dL (ref 32.0–36.0)
MCV: 95.4 fL (ref 80.0–100.0)
MONOS PCT: 9 %
Monocytes Absolute: 0.5 10*3/uL (ref 0.2–0.9)
NEUTROS PCT: 55 %
Neutro Abs: 3 10*3/uL (ref 1.4–6.5)
Platelets: 293 10*3/uL (ref 150–440)
RBC: 3.69 MIL/uL — ABNORMAL LOW (ref 3.80–5.20)
RDW: 14.6 % — ABNORMAL HIGH (ref 11.5–14.5)
WBC: 5.5 10*3/uL (ref 3.6–11.0)

## 2018-03-15 MED ORDER — DEXTROSE 5 % IV SOLN
60.0000 mg | Freq: Once | INTRAVENOUS | Status: AC
  Administered 2018-03-15: 60 mg via INTRAVENOUS
  Filled 2018-03-15: qty 30

## 2018-03-15 MED ORDER — SODIUM CHLORIDE 0.9 % IV SOLN
Freq: Once | INTRAVENOUS | Status: AC
  Administered 2018-03-15: 10:00:00 via INTRAVENOUS
  Filled 2018-03-15: qty 250

## 2018-03-15 MED ORDER — SODIUM CHLORIDE 0.9 % IV SOLN
Freq: Once | INTRAVENOUS | Status: AC
  Administered 2018-03-15: 11:00:00 via INTRAVENOUS
  Filled 2018-03-15: qty 250

## 2018-03-15 MED ORDER — SODIUM CHLORIDE 0.9% FLUSH
10.0000 mL | Freq: Once | INTRAVENOUS | Status: AC
  Administered 2018-03-15: 10 mL via INTRAVENOUS
  Filled 2018-03-15: qty 10

## 2018-03-15 MED ORDER — HEPARIN SOD (PORK) LOCK FLUSH 100 UNIT/ML IV SOLN
500.0000 [IU] | Freq: Once | INTRAVENOUS | Status: AC
  Administered 2018-03-15: 500 [IU] via INTRAVENOUS
  Filled 2018-03-15: qty 5

## 2018-03-15 MED ORDER — PALONOSETRON HCL INJECTION 0.25 MG/5ML
0.2500 mg | Freq: Once | INTRAVENOUS | Status: AC
  Administered 2018-03-15: 0.25 mg via INTRAVENOUS
  Filled 2018-03-15: qty 5

## 2018-03-15 MED ORDER — DEXAMETHASONE SODIUM PHOSPHATE 10 MG/ML IJ SOLN
10.0000 mg | Freq: Once | INTRAMUSCULAR | Status: AC
  Administered 2018-03-15: 10 mg via INTRAVENOUS
  Filled 2018-03-15: qty 1

## 2018-03-15 MED ORDER — SODIUM CHLORIDE 0.9 % IV SOLN
500.0000 mg | Freq: Once | INTRAVENOUS | Status: AC
  Administered 2018-03-15: 500 mg via INTRAVENOUS
  Filled 2018-03-15: qty 25

## 2018-03-15 NOTE — Progress Notes (Signed)
[place] OFFICE PROGRESS NOTE  Patient Care Team: [facility], [facility] as PCP - General (General Practice)  Cancer Staging No matching staging information was found for the patient.   Oncology History   # SEP 2018- MULTIPLE MYELOMA IgALamda [2.5 gm/dl; K/L= 88/1298]; STAGE III [beta 2 microglobulin=5.5] [presented with acute renal failure; anemia; NO hypercalcemia; Skeletal survey-Normal]; BMBx- 45% plasma cells; FISH-POSITIVE 11:14 translocation.[STANDARD-high RISK]/cyto-Normal; SEP 2018- PET- L3 posterior element lesion.   # 9/14- velcade SQ twice weekly/Dex 40 mg/week; OCT 5th 2018-Start R [76m]VD; 3cycles of RVD- PARTAL RESPONSE  # Jan 11th 2019-Dara-Rev-Dex; April 2019- BMBx- plasma cell -by CD-138/IHC-80% [baseline Sep 2018- 85% ]; HOLD transplant [dw Dr.Gasperatto]  # April 29th 2019 2019- carfil-Cyt-Dex; AUG 6th BMBx- 6% plasma cells; VGPR  # Acute renal failure [Dr.Singh; Proteinuria 1.5gm/day ]; acyclovir/Asprin -------------------------------------------------------------   DIAGNOSIS: _0  MULTIPLE MYELOMA  STAGE: III/HIGH RISK ;GOALS: CONTROL  CURRENT/MOST RECENT THERAPY [ April 29th 2019- KYPROLIS-CYTOXAN-DEX      Multiple myeloma in relapse ([place]   11/07/2017 -  Chemotherapy    The patient had palonosetron (ALOXI) injection 0.25 mg, 0.25 mg, Intravenous,  Once, 5 of 6 cycles Administration: 0.25 mg (11/21/2017), 0.25 mg (11/29/2017), 0.25 mg (12/05/2017), 0.25 mg (12/21/2017), 0.25 mg (12/28/2017), 0.25 mg (01/04/2018), 0.25 mg (01/18/2018), 0.25 mg (01/25/2018), 0.25 mg (02/01/2018), 0.25 mg (02/15/2018), 0.25 mg (02/22/2018), 0.25 mg (03/01/2018), 0.25 mg (03/15/2018) cyclophosphamide (CYTOXAN) 500 mg in sodium chloride 0.9 % 250 mL chemo infusion, 540 mg, Intravenous,  Once, 5 of 6 cycles Administration: 500 mg (11/21/2017), 500 mg (11/29/2017), 500 mg (12/05/2017), 500 mg (12/28/2017), 500 mg (01/04/2018), 500 mg (01/18/2018), 500 mg (01/25/2018), 500 mg  (02/01/2018), 500 mg (02/15/2018), 500 mg (02/22/2018), 500 mg (03/01/2018), 500 mg (03/15/2018) carfilzomib (KYPROLIS) 36 mg in dextrose 5 % 50 mL chemo infusion, 20 mg/m2 = 36 mg, Intravenous, Once, 5 of 6 cycles Administration: 36 mg (11/21/2017), 36 mg (11/22/2017), 60 mg (11/29/2017), 60 mg (11/30/2017), 60 mg (12/05/2017), 60 mg (12/06/2017), 60 mg (12/21/2017), 60 mg (12/22/2017), 60 mg (12/28/2017), 60 mg (12/29/2017), 60 mg (01/04/2018), 60 mg (01/05/2018), 60 mg (01/18/2018), 60 mg (01/19/2018), 60 mg (01/25/2018), 60 mg (01/27/2018), 60 mg (02/01/2018), 60 mg (02/02/2018), 60 mg (02/15/2018), 60 mg (02/16/2018), 60 mg (02/22/2018), 60 mg (03/01/2018), 60 mg (03/02/2018), 60 mg (03/15/2018)  for chemotherapy treatment.        INTERVAL HISTORY:  Brandi BUMGARDNER89y.o.  female pleasant patient above history of relapsed multiple myeloma currently on Kyprolis Cytoxan is here for follow-up.  Patient interim had a bone marrow biopsy.  Procedure was uneventful.  Patient denies any unusual shortness of breath or cough.  Appetite.  Denies any swelling in the legs.  No swelling.  Complains of mild right-sided hip pain.  No radiation.  Improved with Tylenol.  Review of Systems  Constitutional: Negative for chills, diaphoresis, fever, malaise/fatigue and weight loss.  HENT: Negative for nosebleeds and sore throat.   Eyes: Negative for double vision.  Respiratory: Negative for cough, hemoptysis, sputum production, shortness of breath and wheezing.   Cardiovascular: Negative for chest pain, palpitations, orthopnea and leg swelling.  Gastrointestinal: Negative for abdominal pain, blood in stool, constipation, diarrhea, heartburn, melena, nausea and vomiting.  Genitourinary: Negative for dysuria, frequency and urgency.  Musculoskeletal: Positive for back pain and joint pain.  Skin: Negative.  Negative for itching and rash.  Neurological: Negative for dizziness, tingling, focal weakness, weakness and headaches.   Endo/Heme/Allergies: Does not bruise/bleed easily.  Psychiatric/Behavioral: Negative for depression. The patient is not nervous/anxious and does not have insomnia.       PAST MEDICAL HISTORY :  Past Medical History:  Diagnosis Date  . Hypertension   . Multiple myeloma ([place])     PAST SURGICAL HISTORY :   Past Surgical History:  Procedure Laterality Date  . IR FLUORO GUIDE PORT INSERTION RIGHT  12/16/2017    FAMILY HISTORY :   Family History  Problem Relation Age of Onset  . Pneumonia Mother   . Seizures Father     SOCIAL HISTORY:   Social History   Tobacco Use  . Smoking status: Current Every Day Smoker    Packs/day: 0.50    Types: Cigarettes  . Smokeless tobacco: Never Used  Substance Use Topics  . Alcohol use: No  . Drug use: No    ALLERGIES:  has No Known Allergies.  MEDICATIONS:  Current Outpatient Medications  Medication Sig Dispense Refill  . acetaminophen (TYLENOL) 500 MG tablet Take 1 tablet (500 mg total) by mouth every 6 (six) hours as needed (TAKE 1 TABLET DAILY X 4 DAYS-STARTING 2 DAYS PRIOR TO CHEMOTHERAPY.). (Patient taking differently: Take 1,000 mg by mouth every 6 (six) hours as needed (TAKE 1 TABLET DAILY X 4 DAYS-STARTING 2 DAYS PRIOR TO CHEMOTHERAPY.). ) 30 tablet 0  . acyclovir (ZOVIRAX) 400 MG tablet One pill a day [to prevent shingles] 30 tablet 6  . albuterol (PROVENTIL HFA;VENTOLIN HFA) 108 (90 Base) MCG/ACT inhaler Inhale 2 puffs into the lungs every 6 (six) hours as needed for wheezing or shortness of breath. 1 Inhaler 2  . amLODipine (NORVASC) 10 MG tablet Take 1 tablet (10 mg total) by mouth daily. 30 tablet 4  . aspirin 325 MG tablet Take 1 tablet by mouth daily.    [name] dexamethasone (DECADRON) 4 MG tablet Take 10 tablets (40 mg total) by mouth once a week. 10 pills in AM with food. 80 tablet 2  . feeding supplement, ENSURE ENLIVE, (ENSURE ENLIVE) LIQD Take 237 mLs by mouth 2 (two) times daily between meals. 60 Bottle 0  .  HYDROcodone-acetaminophen (NORCO) 5-325 MG tablet Take 1 tablet by mouth every 8 (eight) hours as needed for moderate pain. 60 tablet 0  . lidocaine-prilocaine (EMLA) cream Apply 1 application topically as needed. Apply small amount to port site at least 1 hour prior to it being accessed, cover with plastic wrap 30 g 1  . loratadine (CLARITIN) 10 MG tablet Take 1 tablet (10 mg total) by mouth daily. Take daily x 4 days (start 2 days prior to chemo) 30 tablet 3  . montelukast (SINGULAIR) 10 MG tablet Take 1 tablet (10 mg total) by mouth at bedtime. Start 2 days prior to chemo. Take for 4 days total 30 tablet 3  . pantoprazole (PROTONIX) 40 MG tablet Take 1 tablet (40 mg total) by mouth daily. 30 tablet 3  . polyethylene glycol (MIRALAX / GLYCOLAX) packet Take 17 g by mouth daily as needed for mild constipation. 14 each 0  . potassium chloride SA (K-DUR,KLOR-CON) 20 MEQ tablet TAKE 1 TABLET BY MOUTH TWICE DAILY 180 tablet 1  . ranitidine (ZANTAC) 150 MG tablet Take 1 tablet (150 mg total) by mouth 2 (two) times daily. START TAKING 2 DAYS PRIOR TO CHEMOTHERAPY. TAKE FOR A TOTAL OF 4 DAYS     No current facility-administered medications for this visit.    Facility-Administered Medications Ordered in Other Visits  Medication Dose Route Frequency Provider Last Rate Last  Dose  . sodium chloride flush (NS) 0.9 % injection 10 mL  10 mL Intracatheter PRN [name], MD   10 mL at 02/23/18 1400    PHYSICAL EXAMINATION: ECOG PERFORMANCE STATUS: 1 - Symptomatic but completely ambulatory  BP (!) 146/88 (BP Location: Left Arm, Patient Position: Sitting)   Pulse 65   Temp (!) 97.1 F (36.2 C) (Tympanic)   Resp 16   Wt 173 lb 6.4 oz (78.7 kg)   BMI 31.72 kg/m   Filed Weights   03/15/18 0934  Weight: 173 lb 6.4 oz (78.7 kg)    Physical Exam  Constitutional: She is oriented to person, place, and time and well-developed, well-nourished, and in no distress.  Alone.  No acute distress.   HENT:  Head: Normocephalic and atraumatic.  Mouth/Throat: Oropharynx is clear and moist. No oropharyngeal exudate.  Eyes: Pupils are equal, round, and reactive to light.  Neck: Normal range of motion. Neck supple.  Cardiovascular: Normal rate and regular rhythm.  Pulmonary/Chest: No respiratory distress. She has no wheezes.  Abdominal: Soft. Bowel sounds are normal. She exhibits no distension and no mass. There is no tenderness. There is no rebound and no guarding.  Musculoskeletal: Normal range of motion. She exhibits no edema or tenderness.  Neurological: She is alert and oriented to person, place, and time.  Skin: Skin is warm.  Psychiatric: Affect normal.       LABORATORY DATA:  I have reviewed the data as listed    Component Value Date/Time   NA 140 03/15/2018 0911   K 3.8 03/15/2018 0911   CL 114 (H) 03/15/2018 0911   CO2 20 (L) 03/15/2018 0911   GLUCOSE 96 03/15/2018 0911   BUN 15 03/15/2018 0911   CREATININE 0.69 03/15/2018 0911   CALCIUM 8.6 (L) 03/15/2018 0911   PROT 6.6 03/15/2018 0911   ALBUMIN 4.1 03/15/2018 0911   AST 20 03/15/2018 0911   ALT 14 03/15/2018 0911   ALKPHOS 59 03/15/2018 0911   BILITOT 0.6 03/15/2018 0911   GFRNONAA >60 03/15/2018 0911   GFRAA >60 03/15/2018 0911    No results found for: SPEP, UPEP  Lab Results  Component Value Date   WBC 5.5 03/15/2018   NEUTROABS 3.0 03/15/2018   HGB 12.0 03/15/2018   HCT 35.2 03/15/2018   MCV 95.4 03/15/2018   PLT 293 03/15/2018      Chemistry      Component Value Date/Time   NA 140 03/15/2018 0911   K 3.8 03/15/2018 0911   CL 114 (H) 03/15/2018 0911   CO2 20 (L) 03/15/2018 0911   BUN 15 03/15/2018 0911   CREATININE 0.69 03/15/2018 0911      Component Value Date/Time   CALCIUM 8.6 (L) 03/15/2018 0911   ALKPHOS 59 03/15/2018 0911   AST 20 03/15/2018 0911   ALT 14 03/15/2018 0911   BILITOT 0.6 03/15/2018 0911       RADIOGRAPHIC STUDIES: I have personally reviewed the radiological  images as listed and agreed with the findings in the report. No results found.   ASSESSMENT & PLAN:  Multiple myeloma in relapse ([place]) #Multiple myeloma stage III high-risk cytogenetics; transplant eligible.  On Kyprolis-Cytoxan- dexamethasone; improving; aug 13th BMBx- 6% plasma cells- VGPR. Improved.   #  Myeloma labs improving-aug 7th- 0.2gm/dl;-with normalization of the kappa lambda light chain ratio.  I left a message for Dr.Gasperatto-to discuss the timing for autologous stem cell transplant given patient's VG PR to current therapy.  #Proceed with Kyprolis  plus Cytoxan today.  Labs are adequate.  # right hip pain-x10 mins New- recommend continued ES tylenol.   # Skeletal lesions: Continue Xgeva q q. monthly.  Calcium 8.9; STABLE.   # Infection prophylaxis: Continue acyclovir/DVT prophylaxis.  On aspirin 325 mg daily. STABLE.   # weekly-labs; chemo; # follow up in 2 week/labs/chemo/MD.    Orders Placed This Encounter  Procedures  . CBC with Differential/Platelet    Standing Status:   Future    Standing Expiration Date:   03/16/2019  . Comprehensive metabolic panel    Standing Status:   Future    Standing Expiration Date:   03/16/2019  . Kappa/lambda light chains    Standing Status:   Future    Standing Expiration Date:   03/16/2019  . Multiple Myeloma Panel (SPEP&IFE w/QIG)    Standing Status:   Future    Standing Expiration Date:   03/16/2019  . CBC with Differential/Platelet    Standing Status:   Future    Standing Expiration Date:   03/16/2019  . Comprehensive metabolic panel    Standing Status:   Future    Standing Expiration Date:   03/16/2019   All questions were answered. The patient knows to call the clinic with any problems, questions or concerns.      [name], MD 03/15/2018 1:10 PM

## 2018-03-15 NOTE — Assessment & Plan Note (Addendum)
#  Multiple myeloma stage III high-risk cytogenetics; transplant eligible.  On Kyprolis-Cytoxan- dexamethasone; improving; aug 13th BMBx- 6% plasma cells- VGPR. Improved.   #  Myeloma labs improving-aug 7th- 0.2gm/dl;-with normalization of the kappa lambda light chain ratio.  I left a message for Dr.Gasperatto-to discuss the timing for autologous stem cell transplant given patient's VG PR to current therapy.  #Proceed with Kyprolis plus Cytoxan today.  Labs are adequate.  # right hip pain-x10 mins New- recommend continued ES tylenol.   # Skeletal lesions: Continue Xgeva q q. monthly.  Calcium 8.9; STABLE.   # Infection prophylaxis: Continue acyclovir/DVT prophylaxis.  On aspirin 325 mg daily. STABLE.   # weekly-labs; chemo; # follow up in 2 week/labs/chemo/MD.

## 2018-03-15 NOTE — Telephone Encounter (Signed)
FYI- I spoke to [facility]; agrees with re-evaluation for transplant given good response to current therapy with Cy-Kar-dex.

## 2018-03-16 ENCOUNTER — Inpatient Hospital Stay: Payer: Medicare HMO

## 2018-03-16 DIAGNOSIS — C9002 Multiple myeloma in relapse: Secondary | ICD-10-CM

## 2018-03-16 DIAGNOSIS — Z5112 Encounter for antineoplastic immunotherapy: Secondary | ICD-10-CM | POA: Diagnosis not present

## 2018-03-16 DIAGNOSIS — Z5111 Encounter for antineoplastic chemotherapy: Secondary | ICD-10-CM | POA: Diagnosis not present

## 2018-03-16 MED ORDER — HEPARIN SOD (PORK) LOCK FLUSH 100 UNIT/ML IV SOLN
500.0000 [IU] | Freq: Once | INTRAVENOUS | Status: AC | PRN
  Administered 2018-03-16: 500 [IU]
  Filled 2018-03-16: qty 5

## 2018-03-16 MED ORDER — DEXAMETHASONE SODIUM PHOSPHATE 10 MG/ML IJ SOLN
10.0000 mg | Freq: Once | INTRAMUSCULAR | Status: AC
  Administered 2018-03-16: 10 mg via INTRAVENOUS
  Filled 2018-03-16: qty 1

## 2018-03-16 MED ORDER — SODIUM CHLORIDE 0.9 % IV SOLN
Freq: Once | INTRAVENOUS | Status: AC
  Administered 2018-03-16: 14:00:00 via INTRAVENOUS
  Filled 2018-03-16: qty 250

## 2018-03-16 MED ORDER — DEXTROSE 5 % IV SOLN
60.0000 mg | Freq: Once | INTRAVENOUS | Status: AC
  Administered 2018-03-16: 60 mg via INTRAVENOUS
  Filled 2018-03-16: qty 30

## 2018-03-16 NOTE — Telephone Encounter (Signed)
Left msg for Brandi [name] Dr. [facility]ule Ohm office. Per office staff, no new referral needed. Brandi [name] would contact the patient with an apt.

## 2018-03-17 ENCOUNTER — Encounter (HOSPITAL_COMMUNITY): Payer: Self-pay | Admitting: Internal Medicine

## 2018-03-17 NOTE — Telephone Encounter (Signed)
Returned Debbie's call. Dr. Alvie Heidelberg needs patient's last bone marrow biopsy results faxed to [phone]

## 2018-03-17 NOTE — Telephone Encounter (Signed)
Brandi [name] at [facility] office calling - [phone]- requesting call back from Dr. Sharmaine Base nurse

## 2018-03-20 NOTE — Telephone Encounter (Signed)
Bone marrow bx results faxed to Jackelyn Poling, RN at Dr. Lorraine Lax office

## 2018-03-22 ENCOUNTER — Inpatient Hospital Stay: Payer: Medicare HMO

## 2018-03-22 ENCOUNTER — Other Ambulatory Visit: Payer: Self-pay | Admitting: *Deleted

## 2018-03-22 DIAGNOSIS — C9002 Multiple myeloma in relapse: Secondary | ICD-10-CM | POA: Diagnosis not present

## 2018-03-22 DIAGNOSIS — Z5111 Encounter for antineoplastic chemotherapy: Secondary | ICD-10-CM | POA: Diagnosis not present

## 2018-03-22 DIAGNOSIS — Z5112 Encounter for antineoplastic immunotherapy: Secondary | ICD-10-CM | POA: Diagnosis not present

## 2018-03-22 LAB — COMPREHENSIVE METABOLIC PANEL
ALK PHOS: 56 U/L (ref 38–126)
ALT: 13 U/L (ref 0–44)
AST: 20 U/L (ref 15–41)
Albumin: 4 g/dL (ref 3.5–5.0)
Anion gap: 9 (ref 5–15)
BUN: 19 mg/dL (ref 8–23)
CALCIUM: 9 mg/dL (ref 8.9–10.3)
CO2: 23 mmol/L (ref 22–32)
CREATININE: 0.74 mg/dL (ref 0.44–1.00)
Chloride: 111 mmol/L (ref 98–111)
Glucose, Bld: 97 mg/dL (ref 70–99)
Potassium: 3.9 mmol/L (ref 3.5–5.1)
Sodium: 143 mmol/L (ref 135–145)
Total Bilirubin: 0.5 mg/dL (ref 0.3–1.2)
Total Protein: 6.6 g/dL (ref 6.5–8.1)

## 2018-03-22 LAB — CBC WITH DIFFERENTIAL/PLATELET
Basophils Absolute: 0.1 10*3/uL (ref 0–0.1)
Basophils Relative: 1 %
EOS ABS: 0.2 10*3/uL (ref 0–0.7)
Eosinophils Relative: 3 %
HCT: 37.1 % (ref 35.0–47.0)
HEMOGLOBIN: 12.3 g/dL (ref 12.0–16.0)
LYMPHS ABS: 1.8 10*3/uL (ref 1.0–3.6)
Lymphocytes Relative: 26 %
MCH: 31.7 pg (ref 26.0–34.0)
MCHC: 33.3 g/dL (ref 32.0–36.0)
MCV: 95.3 fL (ref 80.0–100.0)
MONOS PCT: 9 %
Monocytes Absolute: 0.7 10*3/uL (ref 0.2–0.9)
NEUTROS PCT: 61 %
Neutro Abs: 4.4 10*3/uL (ref 1.4–6.5)
PLATELETS: 178 10*3/uL (ref 150–440)
RBC: 3.89 MIL/uL (ref 3.80–5.20)
RDW: 14.8 % — ABNORMAL HIGH (ref 11.5–14.5)
WBC: 7.1 10*3/uL (ref 3.6–11.0)

## 2018-03-22 MED ORDER — HEPARIN SOD (PORK) LOCK FLUSH 100 UNIT/ML IV SOLN
500.0000 [IU] | Freq: Once | INTRAVENOUS | Status: DC
  Filled 2018-03-22: qty 5

## 2018-03-22 MED ORDER — SODIUM CHLORIDE 0.9 % IV SOLN
Freq: Once | INTRAVENOUS | Status: AC
  Administered 2018-03-22: 11:00:00 via INTRAVENOUS
  Filled 2018-03-22: qty 250

## 2018-03-22 MED ORDER — SODIUM CHLORIDE 0.9 % IV SOLN
500.0000 mg | Freq: Once | INTRAVENOUS | Status: AC
  Administered 2018-03-22: 500 mg via INTRAVENOUS
  Filled 2018-03-22: qty 25

## 2018-03-22 MED ORDER — SODIUM CHLORIDE 0.9% FLUSH
10.0000 mL | Freq: Once | INTRAVENOUS | Status: DC
  Filled 2018-03-22: qty 10

## 2018-03-22 MED ORDER — PALONOSETRON HCL INJECTION 0.25 MG/5ML
0.2500 mg | Freq: Once | INTRAVENOUS | Status: AC
  Administered 2018-03-22: 0.25 mg via INTRAVENOUS
  Filled 2018-03-22: qty 5

## 2018-03-22 MED ORDER — SODIUM CHLORIDE 0.9 % IV SOLN
Freq: Once | INTRAVENOUS | Status: AC
  Administered 2018-03-22: 10:00:00 via INTRAVENOUS
  Filled 2018-03-22: qty 250

## 2018-03-22 MED ORDER — SODIUM CHLORIDE 0.9 % IV SOLN: 300.0000 mg/m2 | Freq: Once | INTRAVENOUS | Status: DC

## 2018-03-22 MED ORDER — DEXTROSE 5 % IV SOLN
60.0000 mg | Freq: Once | INTRAVENOUS | Status: AC
  Administered 2018-03-22: 60 mg via INTRAVENOUS
  Filled 2018-03-22: qty 30

## 2018-03-22 MED ORDER — DEXTROSE 5 % IV SOLN: 36.0000 mg/m2 | Freq: Once | INTRAVENOUS | Status: DC

## 2018-03-22 MED ORDER — HEPARIN SOD (PORK) LOCK FLUSH 100 UNIT/ML IV SOLN
500.0000 [IU] | Freq: Once | INTRAVENOUS | Status: AC | PRN
  Administered 2018-03-22: 500 [IU]

## 2018-03-22 MED ORDER — DEXAMETHASONE SODIUM PHOSPHATE 10 MG/ML IJ SOLN
10.0000 mg | Freq: Once | INTRAMUSCULAR | Status: AC
  Administered 2018-03-22: 10 mg via INTRAVENOUS
  Filled 2018-03-22: qty 1

## 2018-03-22 MED ORDER — PANTOPRAZOLE SODIUM 40 MG PO TBEC: 40.0000 mg | DELAYED_RELEASE_TABLET | Freq: Every day | ORAL | 3 refills | Status: DC

## 2018-03-22 NOTE — Progress Notes (Signed)
Spoke with Dr. Jacinto Reap, he is ok w/ continuing Kyprolis 60 mg dose. Will verify.   Larene Beach, PharmD

## 2018-03-23 ENCOUNTER — Inpatient Hospital Stay: Payer: Medicare HMO

## 2018-03-23 VITALS — BP 114/74 | HR 56 | Temp 97.1°F | Resp 18

## 2018-03-23 DIAGNOSIS — Z5111 Encounter for antineoplastic chemotherapy: Secondary | ICD-10-CM | POA: Diagnosis not present

## 2018-03-23 DIAGNOSIS — C9002 Multiple myeloma in relapse: Secondary | ICD-10-CM

## 2018-03-23 DIAGNOSIS — Z5112 Encounter for antineoplastic immunotherapy: Secondary | ICD-10-CM | POA: Diagnosis not present

## 2018-03-23 MED ORDER — HEPARIN SOD (PORK) LOCK FLUSH 100 UNIT/ML IV SOLN
500.0000 [IU] | Freq: Once | INTRAVENOUS | Status: AC | PRN
  Administered 2018-03-23: 500 [IU]
  Filled 2018-03-23: qty 5

## 2018-03-23 MED ORDER — SODIUM CHLORIDE 0.9% FLUSH
10.0000 mL | INTRAVENOUS | Status: DC | PRN
  Administered 2018-03-23: 10 mL
  Filled 2018-03-23: qty 10

## 2018-03-23 MED ORDER — SODIUM CHLORIDE 0.9 % IV SOLN
Freq: Once | INTRAVENOUS | Status: AC
  Administered 2018-03-23: 09:00:00 via INTRAVENOUS
  Filled 2018-03-23: qty 250

## 2018-03-23 MED ORDER — DEXTROSE 5 % IV SOLN
60.0000 mg | Freq: Once | INTRAVENOUS | Status: AC
  Administered 2018-03-23: 60 mg via INTRAVENOUS
  Filled 2018-03-23: qty 30

## 2018-03-30 ENCOUNTER — Inpatient Hospital Stay (HOSPITAL_BASED_OUTPATIENT_CLINIC_OR_DEPARTMENT_OTHER): Payer: Medicare HMO | Admitting: Internal Medicine

## 2018-03-30 ENCOUNTER — Inpatient Hospital Stay: Payer: Medicare HMO | Attending: Internal Medicine

## 2018-03-30 ENCOUNTER — Inpatient Hospital Stay: Payer: Medicare HMO

## 2018-03-30 ENCOUNTER — Other Ambulatory Visit: Payer: Self-pay

## 2018-03-30 VITALS — BP 136/84 | HR 62

## 2018-03-30 VITALS — BP 146/91 | HR 61 | Temp 97.6°F | Resp 18

## 2018-03-30 DIAGNOSIS — Z5112 Encounter for antineoplastic immunotherapy: Secondary | ICD-10-CM | POA: Diagnosis not present

## 2018-03-30 DIAGNOSIS — B370 Candidal stomatitis: Secondary | ICD-10-CM

## 2018-03-30 DIAGNOSIS — C9002 Multiple myeloma in relapse: Secondary | ICD-10-CM

## 2018-03-30 DIAGNOSIS — F17210 Nicotine dependence, cigarettes, uncomplicated: Secondary | ICD-10-CM

## 2018-03-30 DIAGNOSIS — M899 Disorder of bone, unspecified: Secondary | ICD-10-CM | POA: Diagnosis not present

## 2018-03-30 DIAGNOSIS — R69 Illness, unspecified: Secondary | ICD-10-CM | POA: Diagnosis not present

## 2018-03-30 DIAGNOSIS — I10 Essential (primary) hypertension: Secondary | ICD-10-CM

## 2018-03-30 LAB — COMPREHENSIVE METABOLIC PANEL
ALBUMIN: 3.8 g/dL (ref 3.5–5.0)
ALK PHOS: 63 U/L (ref 38–126)
ALT: 30 U/L (ref 0–44)
ANION GAP: 7 (ref 5–15)
AST: 30 U/L (ref 15–41)
BUN: 16 mg/dL (ref 8–23)
CALCIUM: 8.5 mg/dL — AB (ref 8.9–10.3)
CO2: 23 mmol/L (ref 22–32)
Chloride: 112 mmol/L — ABNORMAL HIGH (ref 98–111)
Creatinine, Ser: 0.67 mg/dL (ref 0.44–1.00)
GFR calc non Af Amer: >60 mL/min (ref >60)
GLUCOSE: 106 mg/dL — AB (ref 70–99)
Potassium: 3.8 mmol/L (ref 3.5–5.1)
Sodium: 142 mmol/L (ref 135–145)
TOTAL PROTEIN: 6.4 g/dL — AB (ref 6.5–8.1)
Total Bilirubin: 0.9 mg/dL (ref 0.3–1.2)

## 2018-03-30 LAB — CBC WITH DIFFERENTIAL/PLATELET
Basophils Absolute: 0 10*3/uL (ref 0–0.1)
Basophils Relative: 1 %
EOS PCT: 4 %
Eosinophils Absolute: 0.2 10*3/uL (ref 0–0.7)
HCT: 34.9 % — ABNORMAL LOW (ref 35.0–47.0)
HEMOGLOBIN: 11.6 g/dL — AB (ref 12.0–16.0)
LYMPHS ABS: 1.1 10*3/uL (ref 1.0–3.6)
LYMPHS PCT: 20 %
MCH: 31.9 pg (ref 26.0–34.0)
MCHC: 33.2 g/dL (ref 32.0–36.0)
MCV: 96 fL (ref 80.0–100.0)
MONOS PCT: 8 %
Monocytes Absolute: 0.5 10*3/uL (ref 0.2–0.9)
Neutro Abs: 3.7 10*3/uL (ref 1.4–6.5)
Neutrophils Relative %: 67 %
Platelets: 230 10*3/uL (ref 150–440)
RBC: 3.64 MIL/uL — ABNORMAL LOW (ref 3.80–5.20)
RDW: 14.8 % — AB (ref 11.5–14.5)
WBC: 5.5 10*3/uL (ref 3.6–11.0)

## 2018-03-30 MED ORDER — AMLODIPINE BESYLATE 10 MG PO TABS: 10.0000 mg | ORAL_TABLET | Freq: Every day | ORAL | 4 refills | Status: DC

## 2018-03-30 MED ORDER — DEXAMETHASONE SODIUM PHOSPHATE 10 MG/ML IJ SOLN
10.0000 mg | Freq: Once | INTRAMUSCULAR | Status: AC
  Administered 2018-03-30: 10 mg via INTRAVENOUS
  Filled 2018-03-30: qty 1

## 2018-03-30 MED ORDER — SODIUM CHLORIDE 0.9 % IV SOLN
Freq: Once | INTRAVENOUS | Status: AC
  Administered 2018-03-30: 10:00:00 via INTRAVENOUS
  Filled 2018-03-30: qty 250

## 2018-03-30 MED ORDER — SODIUM CHLORIDE 0.9 % IV SOLN
500.0000 mg | Freq: Once | INTRAVENOUS | Status: AC
  Administered 2018-03-30: 500 mg via INTRAVENOUS
  Filled 2018-03-30: qty 25

## 2018-03-30 MED ORDER — DEXTROSE 5 % IV SOLN
60.0000 mg | Freq: Once | INTRAVENOUS | Status: AC
  Administered 2018-03-30: 60 mg via INTRAVENOUS
  Filled 2018-03-30: qty 30

## 2018-03-30 MED ORDER — HEPARIN SOD (PORK) LOCK FLUSH 100 UNIT/ML IV SOLN
500.0000 [IU] | Freq: Once | INTRAVENOUS | Status: AC
  Administered 2018-03-30: 500 [IU] via INTRAVENOUS
  Filled 2018-03-30: qty 5

## 2018-03-30 MED ORDER — NYSTATIN 100000 UNIT/ML MT SUSP: 5.0000 mL | Freq: Four times a day (QID) | OROMUCOSAL | 0 refills | Status: DC

## 2018-03-30 MED ORDER — SODIUM CHLORIDE 0.9% FLUSH
10.0000 mL | INTRAVENOUS | Status: DC | PRN
  Filled 2018-03-30: qty 10

## 2018-03-30 MED ORDER — PANTOPRAZOLE SODIUM 40 MG PO TBEC: 40.0000 mg | DELAYED_RELEASE_TABLET | Freq: Every day | ORAL | 3 refills | Status: DC

## 2018-03-30 MED ORDER — PALONOSETRON HCL INJECTION 0.25 MG/5ML
0.2500 mg | Freq: Once | INTRAVENOUS | Status: AC
  Administered 2018-03-30: 0.25 mg via INTRAVENOUS
  Filled 2018-03-30: qty 5

## 2018-03-30 MED ORDER — HEPARIN SOD (PORK) LOCK FLUSH 100 UNIT/ML IV SOLN: 500.0000 [IU] | Freq: Once | INTRAVENOUS | Status: DC | PRN

## 2018-03-30 MED ORDER — SODIUM CHLORIDE 0.9 % IV SOLN
Freq: Once | INTRAVENOUS | Status: AC
  Administered 2018-03-30: 11:00:00 via INTRAVENOUS
  Filled 2018-03-30: qty 250

## 2018-03-30 MED ORDER — SODIUM CHLORIDE 0.9% FLUSH
10.0000 mL | Freq: Once | INTRAVENOUS | Status: AC
  Administered 2018-03-30: 10 mL via INTRAVENOUS
  Filled 2018-03-30: qty 10

## 2018-03-30 NOTE — Progress Notes (Signed)
[place] OFFICE PROGRESS NOTE  Patient Care Team: Center, [facility] as PCP - General (General Practice)  Cancer Staging No matching staging information was found for the patient.   Oncology History   # SEP 2018- MULTIPLE MYELOMA IgALamda [2.5 gm/dl; K/L= 88/1298]; STAGE III [beta 2 microglobulin=5.5] [presented with acute renal failure; anemia; NO hypercalcemia; Skeletal survey-Normal]; BMBx- 45% plasma cells; FISH-POSITIVE 11:14 translocation.[STANDARD-high RISK]/cyto-Normal; SEP 2018- PET- L3 posterior element lesion.   # 9/14- velcade SQ twice weekly/Dex 40 mg/week; OCT 5th 2018-Start R [2m]VD; 3cycles of RVD- PARTAL RESPONSE  # Jan 11th 2019-Dara-Rev-Dex; April 2019- BMBx- plasma cell -by CD-138/IHC-80% [baseline Sep 2018- 85% ]; HOLD transplant [dw Dr.Gasperatto]  # April 29th 2019 2019- carfil-Cyt-Dex; AUG 6th BMBx- 6% plasma cells; VGPR  # Acute renal failure [Dr.Singh; Proteinuria 1.5gm/day ]; acyclovir/Asprin -------------------------------------------------------------   DIAGNOSIS: '[ ]'  MULTIPLE MYELOMA  STAGE: III/HIGH RISK ;GOALS: CONTROL  CURRENT/MOST RECENT THERAPY [ April 29th 2019- KYPROLIS-CYTOXAN-DEX      [place]   11/07/2017 -  Chemotherapy    The patient had palonosetron (ALOXI) injection 0.25 mg, 0.25 mg, Intravenous,  Once, 5 of 6 cycles Administration: 0.25 mg (11/21/2017), 0.25 mg (11/29/2017), 0.25 mg (12/05/2017), 0.25 mg (12/21/2017), 0.25 mg (12/28/2017), 0.25 mg (01/04/2018), 0.25 mg (01/18/2018), 0.25 mg (01/25/2018), 0.25 mg (02/01/2018), 0.25 mg (02/15/2018), 0.25 mg (02/22/2018), 0.25 mg (03/01/2018), 0.25 mg (03/15/2018), 0.25 mg (03/22/2018), 0.25 mg (03/30/2018) cyclophosphamide (CYTOXAN) 500 mg in sodium chloride 0.9 % 250 mL chemo infusion, 540 mg, Intravenous,  Once, 5 of 6 cycles Administration: 500 mg (11/21/2017), 500 mg (11/29/2017), 500 mg (12/05/2017), 500 mg (12/28/2017), 500 mg (01/04/2018), 500 mg  (01/18/2018), 500 mg (01/25/2018), 500 mg (02/01/2018), 500 mg (02/15/2018), 500 mg (02/22/2018), 500 mg (03/01/2018), 500 mg (03/15/2018), 500 mg (03/30/2018) carfilzomib (KYPROLIS) 36 mg in dextrose 5 % 50 mL chemo infusion, 20 mg/m2 = 36 mg, Intravenous, Once, 5 of 6 cycles Administration: 36 mg (11/21/2017), 36 mg (11/22/2017), 60 mg (11/29/2017), 60 mg (11/30/2017), 60 mg (12/05/2017), 60 mg (12/06/2017), 60 mg (12/21/2017), 60 mg (12/22/2017), 60 mg (12/28/2017), 60 mg (12/29/2017), 60 mg (01/04/2018), 60 mg (01/05/2018), 60 mg (01/18/2018), 60 mg (01/19/2018), 60 mg (01/25/2018), 60 mg (01/27/2018), 60 mg (02/01/2018), 60 mg (02/02/2018), 60 mg (02/15/2018), 60 mg (02/16/2018), 60 mg (02/22/2018), 60 mg (03/01/2018), 60 mg (03/02/2018), 60 mg (03/15/2018), 60 mg (03/16/2018), 60 mg (03/23/2018), 60 mg (03/30/2018), 60 mg (03/31/2018)  for chemotherapy treatment.        INTERVAL HISTORY:  VCANDEE HOON89y.o.  female pleasant patient above history of relapsed multiple myeloma currently on Kyprolis Cytoxan is here for follow-up.  Patient denies any shortness of breath.  Denies any nausea vomiting denies any skin rash.  Appetite is good.  No swelling in the legs.    Review of Systems  Constitutional: Negative for chills, diaphoresis, fever, malaise/fatigue and weight loss.  HENT: Negative for nosebleeds and sore throat.   Eyes: Negative for double vision.  Respiratory: Negative for cough, hemoptysis, sputum production, shortness of breath and wheezing.   Cardiovascular: Negative for chest pain, palpitations, orthopnea and leg swelling.  Gastrointestinal: Negative for abdominal pain, blood in stool, constipation, diarrhea, heartburn, melena, nausea and vomiting.  Genitourinary: Negative for dysuria, frequency and urgency.  Musculoskeletal: Positive for back pain and joint pain.  Skin: Negative.  Negative for itching and rash.  Neurological: Negative for dizziness, tingling, focal weakness, weakness and headaches.   Endo/Heme/Allergies: Does not bruise/bleed easily.  Psychiatric/Behavioral: Negative for depression. The patient is not nervous/anxious and does not have insomnia.       PAST MEDICAL HISTORY :  Past Medical History:  Diagnosis Date  . Hypertension   . Multiple myeloma ([place])     PAST SURGICAL HISTORY :   Past Surgical History:  Procedure Laterality Date  . IR FLUORO GUIDE PORT INSERTION RIGHT  12/16/2017    FAMILY HISTORY :   Family History  Problem Relation Age of Onset  . Pneumonia Mother   . Seizures Father     SOCIAL HISTORY:   Social History   Tobacco Use  . Smoking status: Current Every Day Smoker    Packs/day: 0.50    Types: Cigarettes  . Smokeless tobacco: Never Used  Substance Use Topics  . Alcohol use: No  . Drug use: No    ALLERGIES:  has No Known Allergies.  MEDICATIONS:  Current Outpatient Medications  Medication Sig Dispense Refill  . acetaminophen (TYLENOL) 500 MG tablet Take 1 tablet (500 mg total) by mouth every 6 (six) hours as needed (TAKE 1 TABLET DAILY X 4 DAYS-STARTING 2 DAYS PRIOR TO CHEMOTHERAPY.). 30 tablet 0  . acyclovir (ZOVIRAX) 400 MG tablet One pill a day [to prevent shingles] 30 tablet 6  . albuterol (PROVENTIL HFA;VENTOLIN HFA) 108 (90 Base) MCG/ACT inhaler Inhale 2 puffs into the lungs every 6 (six) hours as needed for wheezing or shortness of breath. 1 Inhaler 2  . amLODipine (NORVASC) 10 MG tablet Take 1 tablet (10 mg total) by mouth daily. 30 tablet 4  . aspirin 325 MG tablet Take 1 tablet by mouth daily.    [name] dexamethasone (DECADRON) 4 MG tablet Take 10 tablets (40 mg total) by mouth once a week. 10 pills in AM with food. 80 tablet 2  . feeding supplement, ENSURE ENLIVE, (ENSURE ENLIVE) LIQD Take 237 mLs by mouth 2 (two) times daily between meals. 60 Bottle 0  . lidocaine-prilocaine (EMLA) cream Apply 1 application topically as needed. Apply small amount to port site at least 1 hour prior to it being accessed, cover with plastic  wrap 30 g 1  . loratadine (CLARITIN) 10 MG tablet Take 1 tablet (10 mg total) by mouth daily. Take daily x 4 days (start 2 days prior to chemo) 30 tablet 3  . montelukast (SINGULAIR) 10 MG tablet Take 1 tablet (10 mg total) by mouth at bedtime. Start 2 days prior to chemo. Take for 4 days total 30 tablet 3  . pantoprazole (PROTONIX) 40 MG tablet Take 1 tablet (40 mg total) by mouth daily. 90 tablet 3  . polyethylene glycol (MIRALAX / GLYCOLAX) packet Take 17 g by mouth daily as needed for mild constipation. 14 each 0  . potassium chloride SA (K-DUR,KLOR-CON) 20 MEQ tablet TAKE 1 TABLET BY MOUTH TWICE DAILY 180 tablet 1  . ranitidine (ZANTAC) 150 MG tablet Take 1 tablet (150 mg total) by mouth 2 (two) times daily. START TAKING 2 DAYS PRIOR TO CHEMOTHERAPY. TAKE FOR A TOTAL OF 4 DAYS    . nystatin (MYCOSTATIN) 100000 UNIT/ML suspension Take 5 mLs (500,000 Units total) by mouth 4 (four) times daily. 60 mL 0   No current facility-administered medications for this visit.    Facility-Administered Medications Ordered in Other Visits  Medication Dose Route Frequency Provider Last Rate Last Dose  . sodium chloride flush (NS) 0.9 % injection 10 mL  10 mL Intracatheter PRN [name], MD   10 mL at 02/23/18 1400  PHYSICAL EXAMINATION: ECOG PERFORMANCE STATUS: 1 - Symptomatic but completely ambulatory  BP (!) 146/91   Pulse 61   Temp 97.6 F (36.4 C) (Tympanic)   Resp 18   There were no vitals filed for this visit.  Physical Exam  Constitutional: She is oriented to person, place, and time and well-developed, well-nourished, and in no distress.  Alone.  No acute distress.  HENT:  Head: Normocephalic and atraumatic.  Eyes: Pupils are equal, round, and reactive to light.  Neck: Normal range of motion. Neck supple.  Cardiovascular: Normal rate and regular rhythm.  Pulmonary/Chest: No respiratory distress. She has no wheezes.  Abdominal: Soft. Bowel sounds are normal. She exhibits no  distension and no mass. There is no tenderness. There is no rebound and no guarding.  Musculoskeletal: Normal range of motion. She exhibits no edema or tenderness.  Neurological: She is alert and oriented to person, place, and time.  Skin: Skin is warm.  Psychiatric: Affect normal.       LABORATORY DATA:  I have reviewed the data as listed    Component Value Date/Time   NA 142 03/30/2018 0900   K 3.8 03/30/2018 0900   CL 112 (H) 03/30/2018 0900   CO2 23 03/30/2018 0900   GLUCOSE 106 (H) 03/30/2018 0900   BUN 16 03/30/2018 0900   CREATININE 0.67 03/30/2018 0900   CALCIUM 8.5 (L) 03/30/2018 0900   PROT 6.4 (L) 03/30/2018 0900   ALBUMIN 3.8 03/30/2018 0900   AST 30 03/30/2018 0900   ALT 30 03/30/2018 0900   ALKPHOS 63 03/30/2018 0900   BILITOT 0.9 03/30/2018 0900   GFRNONAA >60 03/30/2018 0900   GFRAA >60 03/30/2018 0900    No results found for: SPEP, UPEP  Lab Results  Component Value Date   WBC 5.5 03/30/2018   NEUTROABS 3.7 03/30/2018   HGB 11.6 (L) 03/30/2018   HCT 34.9 (L) 03/30/2018   MCV 96.0 03/30/2018   PLT 230 03/30/2018      Chemistry      Component Value Date/Time   NA 142 03/30/2018 0900   K 3.8 03/30/2018 0900   CL 112 (H) 03/30/2018 0900   CO2 23 03/30/2018 0900   BUN 16 03/30/2018 0900   CREATININE 0.67 03/30/2018 0900      Component Value Date/Time   CALCIUM 8.5 (L) 03/30/2018 0900   ALKPHOS 63 03/30/2018 0900   AST 30 03/30/2018 0900   ALT 30 03/30/2018 0900   BILITOT 0.9 03/30/2018 0900       RADIOGRAPHIC STUDIES: I have personally reviewed the radiological images as listed and agreed with the findings in the report. No results found.   ASSESSMENT & PLAN:  Multiple myeloma in relapse ([place]) #Multiple myeloma stage III high-risk cytogenetics; transplant eligible.  On Kyprolis-Cytoxan- dexamethasone; improving; aug 13th BMBx- 6% plasma cells- VGPR.  Stable.  #  Myeloma labs improving-aug 7th- 0.2gm/dl;-with normalization of the  kappa lambda light chain ratio. Discussed with Dr.Gasperatto-agrees with RE-transplant evaluation. Recommend pt calls [facility] for an appointment.  #Proceed with Kyprolis plus Cytoxan today.  Labs are adequate.  # Oral thrush sec to steroids status post nystatin improved.  # Skeletal lesions: Continue Xgeva q q. monthly.  Calcium 8.9; stable.  # Infection prophylaxis: Continue acyclovir/DVT prophylaxis.  On aspirin 325 mg daily. STABLE.   # follow up in 2 week/labs/chemo/MD. I left a message for [facility] multiple myeloma team Debbie-to coordinate patient's timing of stem cell transplant.   No orders of the defined types  were placed in this encounter.  All questions were answered. The patient knows to call the clinic with any problems, questions or concerns.      [name], MD 04/07/2018 10:28 AM

## 2018-03-30 NOTE — Assessment & Plan Note (Addendum)
#  Multiple myeloma stage III high-risk cytogenetics; transplant eligible.  On Kyprolis-Cytoxan- dexamethasone; improving; aug 13th BMBx- 6% plasma cells- VGPR.  Stable.  #  Myeloma labs improving-aug 7th- 0.2gm/dl;-with normalization of the kappa lambda light chain ratio. Discussed with Dr.Gasperatto-agrees with RE-transplant evaluation. Recommend pt calls [facility] for an appointment.  #Proceed with Kyprolis plus Cytoxan today.  Labs are adequate.  # Oral thrush sec to steroids status post nystatin improved.  # Skeletal lesions: Continue Xgeva q q. monthly.  Calcium 8.9; stable.  # Infection prophylaxis: Continue acyclovir/DVT prophylaxis.  On aspirin 325 mg daily. STABLE.   # follow up in 2 week/labs/chemo/MD. I left a message for [facility] multiple myeloma team Debbie-to coordinate patient's timing of stem cell transplant.

## 2018-03-31 ENCOUNTER — Inpatient Hospital Stay: Payer: Medicare HMO

## 2018-03-31 DIAGNOSIS — C9002 Multiple myeloma in relapse: Secondary | ICD-10-CM

## 2018-03-31 DIAGNOSIS — Z5112 Encounter for antineoplastic immunotherapy: Secondary | ICD-10-CM | POA: Diagnosis not present

## 2018-03-31 LAB — MULTIPLE MYELOMA PANEL, SERUM
ALBUMIN SERPL ELPH-MCNC: 3.4 g/dL (ref 2.9–4.4)
ALBUMIN/GLOB SERPL: 1.4 (ref 0.7–1.7)
ALPHA 1: 0.2 g/dL (ref 0.0–0.4)
ALPHA2 GLOB SERPL ELPH-MCNC: 0.8 g/dL (ref 0.4–1.0)
B-GLOBULIN SERPL ELPH-MCNC: 1.2 g/dL (ref 0.7–1.3)
Gamma Glob SerPl Elph-Mcnc: 0.4 g/dL (ref 0.4–1.8)
Globulin, Total: 2.6 g/dL (ref 2.2–3.9)
IGG (IMMUNOGLOBIN G), SERUM: 413 mg/dL — AB (ref 700–1600)
IGM (IMMUNOGLOBULIN M), SRM: 6 mg/dL — AB (ref 26–217)
IgA: 85 mg/dL — ABNORMAL LOW (ref 87–352)
TOTAL PROTEIN ELP: 6 g/dL (ref 6.0–8.5)

## 2018-03-31 LAB — KAPPA/LAMBDA LIGHT CHAINS
Kappa free light chain: 5 mg/L (ref 3.3–19.4)
Kappa, lambda light chain ratio: 0.56 (ref 0.26–1.65)
LAMDA FREE LIGHT CHAINS: 9 mg/L (ref 5.7–26.3)

## 2018-03-31 MED ORDER — SODIUM CHLORIDE 0.9 % IV SOLN
Freq: Once | INTRAVENOUS | Status: AC
  Administered 2018-03-31: 09:00:00 via INTRAVENOUS
  Filled 2018-03-31: qty 250

## 2018-03-31 MED ORDER — HEPARIN SOD (PORK) LOCK FLUSH 100 UNIT/ML IV SOLN
500.0000 [IU] | Freq: Once | INTRAVENOUS | Status: AC | PRN
  Administered 2018-03-31: 500 [IU]
  Filled 2018-03-31: qty 5

## 2018-03-31 MED ORDER — DEXTROSE 5 % IV SOLN
60.0000 mg | Freq: Once | INTRAVENOUS | Status: AC
  Administered 2018-03-31: 60 mg via INTRAVENOUS
  Filled 2018-03-31: qty 30

## 2018-04-12 ENCOUNTER — Other Ambulatory Visit: Payer: Self-pay | Admitting: Internal Medicine

## 2018-04-12 DIAGNOSIS — C9002 Multiple myeloma in relapse: Secondary | ICD-10-CM

## 2018-04-13 ENCOUNTER — Other Ambulatory Visit: Payer: Self-pay

## 2018-04-13 ENCOUNTER — Inpatient Hospital Stay: Payer: Medicare HMO

## 2018-04-13 ENCOUNTER — Inpatient Hospital Stay (HOSPITAL_BASED_OUTPATIENT_CLINIC_OR_DEPARTMENT_OTHER): Payer: Medicare HMO | Admitting: Internal Medicine

## 2018-04-13 VITALS — BP 130/90 | HR 76 | Temp 97.6°F | Resp 20 | Ht 62.0 in | Wt 175.0 lb

## 2018-04-13 DIAGNOSIS — F17210 Nicotine dependence, cigarettes, uncomplicated: Secondary | ICD-10-CM

## 2018-04-13 DIAGNOSIS — I10 Essential (primary) hypertension: Secondary | ICD-10-CM

## 2018-04-13 DIAGNOSIS — R69 Illness, unspecified: Secondary | ICD-10-CM | POA: Diagnosis not present

## 2018-04-13 DIAGNOSIS — C9002 Multiple myeloma in relapse: Secondary | ICD-10-CM | POA: Diagnosis not present

## 2018-04-13 DIAGNOSIS — M899 Disorder of bone, unspecified: Secondary | ICD-10-CM | POA: Diagnosis not present

## 2018-04-13 DIAGNOSIS — Z5112 Encounter for antineoplastic immunotherapy: Secondary | ICD-10-CM | POA: Diagnosis not present

## 2018-04-13 LAB — CBC WITH DIFFERENTIAL/PLATELET
BASOS PCT: 1 %
Basophils Absolute: 0.1 10*3/uL (ref 0–0.1)
EOS ABS: 0.3 10*3/uL (ref 0–0.7)
Eosinophils Relative: 5 %
HEMATOCRIT: 35.2 % (ref 35.0–47.0)
HEMOGLOBIN: 12.1 g/dL (ref 12.0–16.0)
LYMPHS PCT: 34 %
Lymphs Abs: 1.6 10*3/uL (ref 1.0–3.6)
MCH: 32.4 pg (ref 26.0–34.0)
MCHC: 34.3 g/dL (ref 32.0–36.0)
MCV: 94.5 fL (ref 80.0–100.0)
Monocytes Absolute: 0.4 10*3/uL (ref 0.2–0.9)
Monocytes Relative: 8 %
NEUTROS ABS: 2.5 10*3/uL (ref 1.4–6.5)
NEUTROS PCT: 52 %
Platelets: 288 10*3/uL (ref 150–440)
RBC: 3.72 MIL/uL — AB (ref 3.80–5.20)
RDW: 14.7 % — ABNORMAL HIGH (ref 11.5–14.5)
WBC: 4.7 10*3/uL (ref 3.6–11.0)

## 2018-04-13 LAB — COMPREHENSIVE METABOLIC PANEL
ALBUMIN: 4.2 g/dL (ref 3.5–5.0)
ALK PHOS: 56 U/L (ref 38–126)
ALT: 11 U/L (ref 0–44)
ANION GAP: 9 (ref 5–15)
AST: 18 U/L (ref 15–41)
BILIRUBIN TOTAL: 0.5 mg/dL (ref 0.3–1.2)
BUN: 16 mg/dL (ref 8–23)
CALCIUM: 9 mg/dL (ref 8.9–10.3)
CO2: 26 mmol/L (ref 22–32)
Chloride: 107 mmol/L (ref 98–111)
Creatinine, Ser: 0.67 mg/dL (ref 0.44–1.00)
GFR calc non Af Amer: >60 mL/min (ref >60)
Glucose, Bld: 103 mg/dL — ABNORMAL HIGH (ref 70–99)
POTASSIUM: 3.5 mmol/L (ref 3.5–5.1)
Sodium: 142 mmol/L (ref 135–145)
TOTAL PROTEIN: 6.9 g/dL (ref 6.5–8.1)

## 2018-04-13 MED ORDER — SODIUM CHLORIDE 0.9 % IV SOLN
Freq: Once | INTRAVENOUS | Status: AC
  Administered 2018-04-13: 12:00:00 via INTRAVENOUS
  Filled 2018-04-13: qty 250

## 2018-04-13 MED ORDER — SODIUM CHLORIDE 0.9 % IV SOLN
Freq: Once | INTRAVENOUS | Status: AC
  Administered 2018-04-13: 11:00:00 via INTRAVENOUS
  Filled 2018-04-13: qty 250

## 2018-04-13 MED ORDER — PALONOSETRON HCL INJECTION 0.25 MG/5ML
0.2500 mg | Freq: Once | INTRAVENOUS | Status: AC
  Administered 2018-04-13: 0.25 mg via INTRAVENOUS
  Filled 2018-04-13: qty 5

## 2018-04-13 MED ORDER — DEXAMETHASONE SODIUM PHOSPHATE 10 MG/ML IJ SOLN
10.0000 mg | Freq: Once | INTRAMUSCULAR | Status: AC
  Administered 2018-04-13: 10 mg via INTRAVENOUS
  Filled 2018-04-13: qty 1

## 2018-04-13 MED ORDER — SODIUM CHLORIDE 0.9 % IV SOLN
500.0000 mg | Freq: Once | INTRAVENOUS | Status: AC
  Administered 2018-04-13: 500 mg via INTRAVENOUS
  Filled 2018-04-13: qty 25

## 2018-04-13 MED ORDER — DEXTROSE 5 % IV SOLN
60.0000 mg | Freq: Once | INTRAVENOUS | Status: AC
  Administered 2018-04-13: 60 mg via INTRAVENOUS
  Filled 2018-04-13: qty 30

## 2018-04-13 MED ORDER — SODIUM CHLORIDE 0.9% FLUSH
10.0000 mL | Freq: Once | INTRAVENOUS | Status: AC
  Administered 2018-04-13: 10 mL via INTRAVENOUS
  Filled 2018-04-13: qty 10

## 2018-04-13 MED ORDER — HEPARIN SOD (PORK) LOCK FLUSH 100 UNIT/ML IV SOLN
500.0000 [IU] | Freq: Once | INTRAVENOUS | Status: AC
  Administered 2018-04-13: 500 [IU] via INTRAVENOUS
  Filled 2018-04-13: qty 5

## 2018-04-13 NOTE — Progress Notes (Signed)
[place] OFFICE PROGRESS NOTE  Patient Care Team: Center, [facility] as PCP - General (General Practice)  Cancer Staging No matching staging information was found for the patient.   Oncology History   # SEP 2018- MULTIPLE MYELOMA IgALamda [2.5 gm/dl; K/L= 88/1298]; STAGE III [beta 2 microglobulin=5.5] [presented with acute renal failure; anemia; NO hypercalcemia; Skeletal survey-Normal]; BMBx- 45% plasma cells; FISH-POSITIVE 11:14 translocation.[STANDARD-high RISK]/cyto-Normal; SEP 2018- PET- L3 posterior element lesion.   # 9/14- velcade SQ twice weekly/Dex 40 mg/week; OCT 5th 2018-Start R [69m]VD; 3cycles of RVD- PARTAL RESPONSE  # Jan 11th 2019-Dara-Rev-Dex; April 2019- BMBx- plasma cell -by CD-138/IHC-80% [baseline Sep 2018- 85% ]; HOLD transplant [dw Dr.Gasperatto]  # April 29th 2019 2019- carfil-Cyt-Dex; AUG 6th BMBx- 6% plasma cells; VGPR  # Acute renal failure [Dr.Singh; Proteinuria 1.5gm/day ]; acyclovir/Asprin -------------------------------------------------------------   DIAGNOSIS: '[ ]'  MULTIPLE MYELOMA  STAGE: III/HIGH RISK ;GOALS: CONTROL  CURRENT/MOST RECENT THERAPY [ April 29th 2019- KYPROLIS-CYTOXAN-DEX      [place] in [place]   11/07/2017 -  Chemotherapy    The patient had palonosetron (ALOXI) injection 0.25 mg, 0.25 mg, Intravenous,  Once, 6 of 7 cycles Administration: 0.25 mg (11/21/2017), 0.25 mg (11/29/2017), 0.25 mg (12/05/2017), 0.25 mg (12/21/2017), 0.25 mg (12/28/2017), 0.25 mg (01/04/2018), 0.25 mg (01/18/2018), 0.25 mg (01/25/2018), 0.25 mg (02/01/2018), 0.25 mg (02/15/2018), 0.25 mg (02/22/2018), 0.25 mg (03/01/2018), 0.25 mg (03/15/2018), 0.25 mg (03/22/2018), 0.25 mg (03/30/2018) cyclophosphamide (CYTOXAN) 500 mg in sodium chloride 0.9 % 250 mL chemo infusion, 540 mg, Intravenous,  Once, 6 of 7 cycles Administration: 500 mg (11/21/2017), 500 mg (11/29/2017), 500 mg (12/05/2017), 500 mg (12/28/2017), 500 mg (01/04/2018), 500 mg  (01/18/2018), 500 mg (01/25/2018), 500 mg (02/01/2018), 500 mg (02/15/2018), 500 mg (02/22/2018), 500 mg (03/01/2018), 500 mg (03/15/2018), 500 mg (03/30/2018) carfilzomib (KYPROLIS) 36 mg in dextrose 5 % 50 mL chemo infusion, 20 mg/m2 = 36 mg, Intravenous, Once, 6 of 7 cycles Administration: 36 mg (11/21/2017), 36 mg (11/22/2017), 60 mg (11/29/2017), 60 mg (11/30/2017), 60 mg (12/05/2017), 60 mg (12/06/2017), 60 mg (12/21/2017), 60 mg (12/22/2017), 60 mg (12/28/2017), 60 mg (12/29/2017), 60 mg (01/04/2018), 60 mg (01/05/2018), 60 mg (01/18/2018), 60 mg (01/19/2018), 60 mg (01/25/2018), 60 mg (01/27/2018), 60 mg (02/01/2018), 60 mg (02/02/2018), 60 mg (02/15/2018), 60 mg (02/16/2018), 60 mg (02/22/2018), 60 mg (03/01/2018), 60 mg (03/02/2018), 60 mg (03/15/2018), 60 mg (03/16/2018), 60 mg (03/23/2018), 60 mg (03/30/2018), 60 mg (03/31/2018)  for chemotherapy treatment.        INTERVAL HISTORY:  Brandi [id number].o.  female pleasant patient above history of relapsed multiple myeloma currently on Kyprolis Cytoxan is here for follow-up.  Patient complains of intermittent swelling in the legs.  Swelling improves in the morning gets worse over the evening.  No skin rash no shortness of breath no tingling or numbness.  Patient has not heard back from you. Review of Systems  Constitutional: Negative for chills, diaphoresis, fever, malaise/fatigue and weight loss.       Weight gain.  HENT: Negative for nosebleeds and sore throat.   Eyes: Negative for double vision.  Respiratory: Negative for cough, hemoptysis, sputum production, shortness of breath and wheezing.   Cardiovascular: Positive for leg swelling. Negative for chest pain, palpitations and orthopnea.  Gastrointestinal: Negative for abdominal pain, blood in stool, constipation, diarrhea, heartburn, melena, nausea and vomiting.  Genitourinary: Negative for dysuria, frequency and urgency.  Skin: Negative.  Negative for itching and rash.  Neurological: Negative for dizziness, tingling,  focal weakness, weakness and headaches.  Endo/Heme/Allergies: Does not bruise/bleed easily.  Psychiatric/Behavioral: Negative for depression. The patient is not nervous/anxious and does not have insomnia.       PAST MEDICAL HISTORY :  Past Medical History:  Diagnosis Date  . Hypertension   . Multiple myeloma ([place])     PAST SURGICAL HISTORY :   Past Surgical History:  Procedure Laterality Date  . IR FLUORO GUIDE PORT INSERTION RIGHT  12/16/2017    FAMILY HISTORY :   Family History  Problem Relation Age of Onset  . Pneumonia Mother   . Seizures Father     SOCIAL HISTORY:   Social History   Tobacco Use  . Smoking status: Current Every Day Smoker    Packs/day: 0.50    Types: Cigarettes  . Smokeless tobacco: Never Used  Substance Use Topics  . Alcohol use: No  . Drug use: No    ALLERGIES:  has No Known Allergies.  MEDICATIONS:  Current Outpatient Medications  Medication Sig Dispense Refill  . acetaminophen (TYLENOL) 500 MG tablet Take 1 tablet (500 mg total) by mouth every 6 (six) hours as needed (TAKE 1 TABLET DAILY X 4 DAYS-STARTING 2 DAYS PRIOR TO CHEMOTHERAPY.). 30 tablet 0  . acyclovir (ZOVIRAX) 400 MG tablet One pill a day [to prevent shingles] 30 tablet 6  . amLODipine (NORVASC) 10 MG tablet Take 1 tablet (10 mg total) by mouth daily. 30 tablet 4  . aspirin 325 MG tablet Take 1 tablet by mouth daily.    [name] dexamethasone (DECADRON) 4 MG tablet Take 10 tablets (40 mg total) by mouth once a week. 10 pills in AM with food. 80 tablet 2  . feeding supplement, ENSURE ENLIVE, (ENSURE ENLIVE) LIQD Take 237 mLs by mouth 2 (two) times daily between meals. 60 Bottle 0  . loratadine (CLARITIN) 10 MG tablet Take 1 tablet (10 mg total) by mouth daily. Take daily x 4 days (start 2 days prior to chemo) 30 tablet 3  . montelukast (SINGULAIR) 10 MG tablet Take 1 tablet (10 mg total) by mouth at bedtime. Start 2 days prior to chemo. Take for 4 days total 30 tablet 3  . nystatin  (MYCOSTATIN) 100000 UNIT/ML suspension Take 5 mLs (500,000 Units total) by mouth 4 (four) times daily. 60 mL 0  . pantoprazole (PROTONIX) 40 MG tablet Take 1 tablet (40 mg total) by mouth daily. 90 tablet 3  . polyethylene glycol (MIRALAX / GLYCOLAX) packet Take 17 g by mouth daily as needed for mild constipation. 14 each 0  . ranitidine (ZANTAC) 150 MG tablet Take 1 tablet (150 mg total) by mouth 2 (two) times daily. START TAKING 2 DAYS PRIOR TO CHEMOTHERAPY. TAKE FOR A TOTAL OF 4 DAYS    . albuterol (PROVENTIL HFA;VENTOLIN HFA) 108 (90 Base) MCG/ACT inhaler Inhale 2 puffs into the lungs every 6 (six) hours as needed for wheezing or shortness of breath. (Patient not taking: Reported on 04/13/2018) 1 Inhaler 2  . lidocaine-prilocaine (EMLA) cream Apply 1 application topically as needed. Apply small amount to port site at least 1 hour prior to it being accessed, cover with plastic wrap (Patient not taking: Reported on 04/13/2018) 30 g 1  . potassium chloride SA (K-DUR,KLOR-CON) 20 MEQ tablet TAKE 1 TABLET BY MOUTH TWICE DAILY (Patient not taking: Reported on 04/13/2018) 180 tablet 1   No current facility-administered medications for this visit.    Facility-Administered Medications Ordered in Other Visits  Medication Dose Route Frequency Provider Last Rate  Last Dose  . carfilzomib (KYPROLIS) 60 mg in dextrose 5 % 50 mL chemo infusion  60 mg Intravenous Once Charlaine Dalton R, MD 160 mL/hr at 04/13/18 1201 60 mg at 04/13/18 1201  . cyclophosphamide (CYTOXAN) 500 mg in sodium chloride 0.9 % 250 mL chemo infusion  500 mg Intravenous Once Charlaine Dalton R, MD      . heparin lock flush 100 unit/mL  500 Units Intravenous Once Charlaine Dalton R, MD      . sodium chloride flush (NS) 0.9 % injection 10 mL  10 mL Intracatheter PRN [name], MD   10 mL at 02/23/18 1400    PHYSICAL EXAMINATION: ECOG PERFORMANCE STATUS: 1 - Symptomatic but completely ambulatory  BP 130/90   Pulse 76    Temp 97.6 F (36.4 C) (Tympanic)   Resp 20   Ht '5\' 2"'  (1.575 m)   Wt 175 lb (79.4 kg)   BMI 32.01 kg/m   Filed Weights   04/13/18 1002  Weight: 175 lb (79.4 kg)    Physical Exam  Constitutional: She is oriented to person, place, and time and well-developed, well-nourished, and in no distress.  Alone.  No acute distress.  HENT:  Head: Normocephalic and atraumatic.  Eyes: Pupils are equal, round, and reactive to light.  Neck: Normal range of motion. Neck supple.  Cardiovascular: Normal rate and regular rhythm.  Pulmonary/Chest: No respiratory distress. She has no wheezes.  Abdominal: Soft. Bowel sounds are normal. She exhibits no distension and no mass. There is no tenderness. There is no rebound and no guarding.  Musculoskeletal: Normal range of motion. She exhibits edema (Bilateral ankle edema.). She exhibits no tenderness.  Neurological: She is alert and oriented to person, place, and time.  Skin: Skin is warm.  Psychiatric: Affect normal.       LABORATORY DATA:  I have reviewed the data as listed    Component Value Date/Time   NA 142 04/13/2018 0936   K 3.5 04/13/2018 0936   CL 107 04/13/2018 0936   CO2 26 04/13/2018 0936   GLUCOSE 103 (H) 04/13/2018 0936   BUN 16 04/13/2018 0936   CREATININE 0.67 04/13/2018 0936   CALCIUM 9.0 04/13/2018 0936   PROT 6.9 04/13/2018 0936   ALBUMIN 4.2 04/13/2018 0936   AST 18 04/13/2018 0936   ALT 11 04/13/2018 0936   ALKPHOS 56 04/13/2018 0936   BILITOT 0.5 04/13/2018 0936   GFRNONAA >60 04/13/2018 0936   GFRAA >60 04/13/2018 0936    No results found for: SPEP, UPEP  Lab Results  Component Value Date   WBC 4.7 04/13/2018   NEUTROABS 2.5 04/13/2018   HGB 12.1 04/13/2018   HCT 35.2 04/13/2018   MCV 94.5 04/13/2018   PLT 288 04/13/2018      Chemistry      Component Value Date/Time   NA 142 04/13/2018 0936   K 3.5 04/13/2018 0936   CL 107 04/13/2018 0936   CO2 26 04/13/2018 0936   BUN 16 04/13/2018 0936    CREATININE 0.67 04/13/2018 0936      Component Value Date/Time   CALCIUM 9.0 04/13/2018 0936   ALKPHOS 56 04/13/2018 0936   AST 18 04/13/2018 0936   ALT 11 04/13/2018 0936   BILITOT 0.5 04/13/2018 0936       RADIOGRAPHIC STUDIES: I have personally reviewed the radiological images as listed and agreed with the findings in the report. No results found.   ASSESSMENT & PLAN:  Multiple myeloma in relapse (  [place]) #Multiple myeloma stage III high-risk cytogenetics; transplant eligible.  On Kyprolis-Cytoxan- dexamethasone; improving; aug 21st BMBx- 6% plasma cells- VGPR. STABLE.  #  Myeloma labs improving-SEP 5th 2019- SPEP-NEG/K/l-Normal; Discussed with Dr.Gasperatto-agrees with RE-transplant evaluation.  I spoke to nurse at [facility] 2 weeks ago.  Unfortunately no contact yet with 2.  I have recommend pt calls [facility] for an appointment ASAP to discuss transplant option.  # Proceed with Kyprolis plus Cytoxan today.  Labs are adequate.  #Bilateral ankle edema-recommend stopping p.o. Steroids; continue IV steroids as part of treatment with Cytoxan/Kyprolis.  # Skeletal lesions: Continue Xgeva q q. Monthly. [last 7/24]; x-geva tomorrow.   # Infection prophylaxis: Continue acyclovir/DVT prophylaxis.  On aspirin 325 mg daily. STABLE.   # follow up in 2 week/labs/chemo/MD. Asked pt to call [facility] for follow.    Orders Placed This Encounter  Procedures  . CBC with Differential    Standing Status:   Future    Standing Expiration Date:   04/14/2019  . Basic metabolic panel    Standing Status:   Future    Standing Expiration Date:   04/14/2019  . Comprehensive metabolic panel    Standing Status:   Future    Standing Expiration Date:   04/13/2019  . CBC with Differential    Standing Status:   Future    Standing Expiration Date:   04/13/2019   All questions were answered. The patient knows to call the clinic with any problems, questions or concerns.      [name], MD 04/13/2018 12:26  PM

## 2018-04-13 NOTE — Assessment & Plan Note (Addendum)
#  Multiple myeloma stage III high-risk cytogenetics; transplant eligible.  On Kyprolis-Cytoxan- dexamethasone; improving; aug 21st BMBx- 6% plasma cells- VGPR. STABLE.  #  Myeloma labs improving-SEP 5th 2019- SPEP-NEG/K/l-Normal; Discussed with Dr.Gasperatto-agrees with RE-transplant evaluation.  I spoke to nurse at [facility] 2 weeks ago.  Unfortunately no contact yet with 2.  I have recommend pt calls [facility] for an appointment ASAP to discuss transplant option.  # Proceed with Kyprolis plus Cytoxan today.  Labs are adequate.  #Bilateral ankle edema-recommend stopping p.o. Steroids; continue IV steroids as part of treatment with Cytoxan/Kyprolis.  # Skeletal lesions: Continue Xgeva q q. Monthly. [last 7/24]; x-geva tomorrow.   # Infection prophylaxis: Continue acyclovir/DVT prophylaxis.  On aspirin 325 mg daily. STABLE.   # follow up in 2 week/labs/chemo/MD. Asked pt to call [facility] for follow.

## 2018-04-13 NOTE — Progress Notes (Signed)
Checked with MD about doses more than 10% difference due to weight gain. Per MD, keep doses the same for cytoxan and kyprolis as weight gain is likely due to steroids.

## 2018-04-13 NOTE — Patient Instructions (Signed)
#   STOP dexamethasone pills because of leg swelling  # call [facility] re: appt for stem cell transplant.

## 2018-04-14 ENCOUNTER — Inpatient Hospital Stay: Payer: Medicare HMO

## 2018-04-14 ENCOUNTER — Telehealth: Payer: Self-pay | Admitting: *Deleted

## 2018-04-14 VITALS — BP 149/74 | HR 91 | Temp 96.8°F | Resp 18

## 2018-04-14 DIAGNOSIS — C9002 Multiple myeloma in relapse: Secondary | ICD-10-CM

## 2018-04-14 DIAGNOSIS — Z5112 Encounter for antineoplastic immunotherapy: Secondary | ICD-10-CM | POA: Diagnosis not present

## 2018-04-14 LAB — MULTIPLE MYELOMA PANEL, SERUM
ALBUMIN SERPL ELPH-MCNC: 3.6 g/dL (ref 2.9–4.4)
Albumin/Glob SerPl: 1.7 (ref 0.7–1.7)
Alpha 1: 0.1 g/dL (ref 0.0–0.4)
Alpha2 Glob SerPl Elph-Mcnc: 0.7 g/dL (ref 0.4–1.0)
B-GLOBULIN SERPL ELPH-MCNC: 1.1 g/dL (ref 0.7–1.3)
Gamma Glob SerPl Elph-Mcnc: 0.3 g/dL — ABNORMAL LOW (ref 0.4–1.8)
Globulin, Total: 2.2 g/dL (ref 2.2–3.9)
IgA: 92 mg/dL (ref 87–352)
IgG (Immunoglobin G), Serum: 422 mg/dL — ABNORMAL LOW (ref 700–1600)
IgM (Immunoglobulin M), Srm: 7 mg/dL — ABNORMAL LOW (ref 26–217)
TOTAL PROTEIN ELP: 5.8 g/dL — AB (ref 6.0–8.5)

## 2018-04-14 LAB — KAPPA/LAMBDA LIGHT CHAINS
KAPPA, LAMDA LIGHT CHAIN RATIO: 0.58 (ref 0.26–1.65)
Kappa free light chain: 5.3 mg/L (ref 3.3–19.4)
Lambda free light chains: 9.1 mg/L (ref 5.7–26.3)

## 2018-04-14 MED ORDER — DENOSUMAB 120 MG/1.7ML ~~LOC~~ SOLN
120.0000 mg | Freq: Once | SUBCUTANEOUS | Status: AC
  Administered 2018-04-14: 120 mg via SUBCUTANEOUS
  Filled 2018-04-14: qty 1.7

## 2018-04-14 MED ORDER — SODIUM CHLORIDE 0.9 % IV SOLN
Freq: Once | INTRAVENOUS | Status: AC
  Administered 2018-04-14: 10:00:00 via INTRAVENOUS
  Filled 2018-04-14: qty 250

## 2018-04-14 MED ORDER — DEXAMETHASONE SODIUM PHOSPHATE 10 MG/ML IJ SOLN
10.0000 mg | Freq: Once | INTRAMUSCULAR | Status: AC
  Administered 2018-04-14: 10 mg via INTRAVENOUS
  Filled 2018-04-14: qty 1

## 2018-04-14 MED ORDER — HEPARIN SOD (PORK) LOCK FLUSH 100 UNIT/ML IV SOLN
500.0000 [IU] | Freq: Once | INTRAVENOUS | Status: AC | PRN
  Administered 2018-04-14: 500 [IU]
  Filled 2018-04-14: qty 5

## 2018-04-14 MED ORDER — DEXTROSE 5 % IV SOLN
60.0000 mg | Freq: Once | INTRAVENOUS | Status: AC
  Administered 2018-04-14: 60 mg via INTRAVENOUS
  Filled 2018-04-14: qty 30

## 2018-04-14 NOTE — Telephone Encounter (Signed)
Per pt - patient has not been contacted with [facility] transplant apts.  md requested for Nira Conn, RN to f/u on apts for patient with Dr. Verlin Dike. RN Reviewed chart- apt in care everywhere appears to be scheduled on 05/11/2018.  Left msg for Debbie- transplant nurse coordinator to contact the patient.

## 2018-04-20 ENCOUNTER — Inpatient Hospital Stay: Payer: Medicare HMO

## 2018-04-20 VITALS — BP 135/80 | HR 81 | Temp 95.2°F | Resp 18 | Wt 175.0 lb

## 2018-04-20 DIAGNOSIS — Z5112 Encounter for antineoplastic immunotherapy: Secondary | ICD-10-CM | POA: Diagnosis not present

## 2018-04-20 DIAGNOSIS — C9002 Multiple myeloma in relapse: Secondary | ICD-10-CM

## 2018-04-20 LAB — CBC WITH DIFFERENTIAL/PLATELET
Basophils Absolute: 0 10*3/uL (ref 0–0.1)
Basophils Relative: 1 %
EOS ABS: 0.3 10*3/uL (ref 0–0.7)
EOS PCT: 7 %
HCT: 36.1 % (ref 35.0–47.0)
Hemoglobin: 12.1 g/dL (ref 12.0–16.0)
LYMPHS ABS: 1.5 10*3/uL (ref 1.0–3.6)
LYMPHS PCT: 29 %
MCH: 32.3 pg (ref 26.0–34.0)
MCHC: 33.5 g/dL (ref 32.0–36.0)
MCV: 96.4 fL (ref 80.0–100.0)
MONO ABS: 0.5 10*3/uL (ref 0.2–0.9)
Monocytes Relative: 9 %
Neutro Abs: 2.8 10*3/uL (ref 1.4–6.5)
Neutrophils Relative %: 54 %
PLATELETS: 200 10*3/uL (ref 150–440)
RBC: 3.75 MIL/uL — ABNORMAL LOW (ref 3.80–5.20)
RDW: 14.3 % (ref 11.5–14.5)
WBC: 5.2 10*3/uL (ref 3.6–11.0)

## 2018-04-20 LAB — BASIC METABOLIC PANEL
Anion gap: 6 (ref 5–15)
BUN: 14 mg/dL (ref 8–23)
CHLORIDE: 112 mmol/L — AB (ref 98–111)
CO2: 22 mmol/L (ref 22–32)
CREATININE: 0.65 mg/dL (ref 0.44–1.00)
Calcium: 8.6 mg/dL — ABNORMAL LOW (ref 8.9–10.3)
GFR calc Af Amer: >60 mL/min (ref >60)
GFR calc non Af Amer: >60 mL/min (ref >60)
Glucose, Bld: 106 mg/dL — ABNORMAL HIGH (ref 70–99)
Potassium: 3.6 mmol/L (ref 3.5–5.1)
SODIUM: 140 mmol/L (ref 135–145)

## 2018-04-20 MED ORDER — HEPARIN SOD (PORK) LOCK FLUSH 100 UNIT/ML IV SOLN
500.0000 [IU] | Freq: Once | INTRAVENOUS | Status: AC | PRN
  Administered 2018-04-20: 500 [IU]
  Filled 2018-04-20: qty 5

## 2018-04-20 MED ORDER — PALONOSETRON HCL INJECTION 0.25 MG/5ML
0.2500 mg | Freq: Once | INTRAVENOUS | Status: AC
  Administered 2018-04-20: 0.25 mg via INTRAVENOUS
  Filled 2018-04-20: qty 5

## 2018-04-20 MED ORDER — DEXAMETHASONE SODIUM PHOSPHATE 10 MG/ML IJ SOLN
10.0000 mg | Freq: Once | INTRAMUSCULAR | Status: AC
  Administered 2018-04-20: 10 mg via INTRAVENOUS
  Filled 2018-04-20: qty 1

## 2018-04-20 MED ORDER — SODIUM CHLORIDE 0.9 % IV SOLN
Freq: Once | INTRAVENOUS | Status: AC
  Administered 2018-04-20: 10:00:00 via INTRAVENOUS
  Filled 2018-04-20: qty 250

## 2018-04-20 MED ORDER — DEXTROSE 5 % IV SOLN
60.0000 mg | Freq: Once | INTRAVENOUS | Status: AC
  Administered 2018-04-20: 60 mg via INTRAVENOUS
  Filled 2018-04-20: qty 30

## 2018-04-20 MED ORDER — SODIUM CHLORIDE 0.9 % IV SOLN
500.0000 mg | Freq: Once | INTRAVENOUS | Status: AC
  Administered 2018-04-20: 500 mg via INTRAVENOUS
  Filled 2018-04-20: qty 25

## 2018-04-20 NOTE — Progress Notes (Signed)
Per Dr. Rogue Bussing okay to proceed with BMP.

## 2018-04-21 ENCOUNTER — Inpatient Hospital Stay: Payer: Medicare HMO

## 2018-04-21 VITALS — BP 128/81 | HR 61 | Temp 98.1°F | Resp 18

## 2018-04-21 DIAGNOSIS — C9002 Multiple myeloma in relapse: Secondary | ICD-10-CM

## 2018-04-21 DIAGNOSIS — Z5112 Encounter for antineoplastic immunotherapy: Secondary | ICD-10-CM | POA: Diagnosis not present

## 2018-04-21 MED ORDER — SODIUM CHLORIDE 0.9 % IV SOLN
Freq: Once | INTRAVENOUS | Status: DC
  Filled 2018-04-21: qty 250

## 2018-04-21 MED ORDER — SODIUM CHLORIDE 0.9% FLUSH
10.0000 mL | INTRAVENOUS | Status: DC | PRN
  Administered 2018-04-21: 10 mL
  Filled 2018-04-21: qty 10

## 2018-04-21 MED ORDER — DEXAMETHASONE SODIUM PHOSPHATE 10 MG/ML IJ SOLN
10.0000 mg | Freq: Once | INTRAMUSCULAR | Status: AC
  Administered 2018-04-21: 10 mg via INTRAVENOUS
  Filled 2018-04-21: qty 1

## 2018-04-21 MED ORDER — DEXTROSE 5 % IV SOLN
60.0000 mg | Freq: Once | INTRAVENOUS | Status: AC
  Administered 2018-04-21: 60 mg via INTRAVENOUS
  Filled 2018-04-21: qty 30

## 2018-04-21 MED ORDER — SODIUM CHLORIDE 0.9 % IV SOLN
Freq: Once | INTRAVENOUS | Status: AC
  Administered 2018-04-21: 09:00:00 via INTRAVENOUS
  Filled 2018-04-21: qty 250

## 2018-04-21 MED ORDER — SODIUM CHLORIDE 0.9 % IV SOLN
Freq: Once | INTRAVENOUS | Status: AC
  Administered 2018-04-21: 250 mL via INTRAVENOUS
  Filled 2018-04-21: qty 250

## 2018-04-21 MED ORDER — HEPARIN SOD (PORK) LOCK FLUSH 100 UNIT/ML IV SOLN
500.0000 [IU] | Freq: Once | INTRAVENOUS | Status: AC | PRN
  Administered 2018-04-21: 500 [IU]
  Filled 2018-04-21: qty 5

## 2018-04-27 ENCOUNTER — Inpatient Hospital Stay: Payer: Medicare HMO

## 2018-04-27 ENCOUNTER — Inpatient Hospital Stay: Payer: Medicare HMO | Attending: Internal Medicine

## 2018-04-27 ENCOUNTER — Encounter: Payer: Self-pay | Admitting: Internal Medicine

## 2018-04-27 ENCOUNTER — Inpatient Hospital Stay (HOSPITAL_BASED_OUTPATIENT_CLINIC_OR_DEPARTMENT_OTHER): Payer: Medicare HMO | Admitting: Internal Medicine

## 2018-04-27 VITALS — BP 152/84 | HR 57 | Temp 97.1°F | Resp 16 | Wt 177.0 lb

## 2018-04-27 DIAGNOSIS — C9002 Multiple myeloma in relapse: Secondary | ICD-10-CM

## 2018-04-27 DIAGNOSIS — Z5112 Encounter for antineoplastic immunotherapy: Secondary | ICD-10-CM | POA: Diagnosis not present

## 2018-04-27 DIAGNOSIS — R0602 Shortness of breath: Secondary | ICD-10-CM | POA: Insufficient documentation

## 2018-04-27 DIAGNOSIS — E876 Hypokalemia: Secondary | ICD-10-CM | POA: Diagnosis not present

## 2018-04-27 DIAGNOSIS — R600 Localized edema: Secondary | ICD-10-CM

## 2018-04-27 DIAGNOSIS — M899 Disorder of bone, unspecified: Secondary | ICD-10-CM

## 2018-04-27 LAB — COMPREHENSIVE METABOLIC PANEL
ALBUMIN: 4 g/dL (ref 3.5–5.0)
ALT: 12 U/L (ref 0–44)
ANION GAP: 7 (ref 5–15)
AST: 19 U/L (ref 15–41)
Alkaline Phosphatase: 53 U/L (ref 38–126)
BUN: 20 mg/dL (ref 8–23)
CO2: 22 mmol/L (ref 22–32)
Calcium: 9.4 mg/dL (ref 8.9–10.3)
Chloride: 115 mmol/L — ABNORMAL HIGH (ref 98–111)
Creatinine, Ser: 0.66 mg/dL (ref 0.44–1.00)
GFR calc Af Amer: >60 mL/min (ref >60)
GFR calc non Af Amer: >60 mL/min (ref >60)
GLUCOSE: 89 mg/dL (ref 70–99)
Potassium: 3.6 mmol/L (ref 3.5–5.1)
SODIUM: 144 mmol/L (ref 135–145)
TOTAL PROTEIN: 6.3 g/dL — AB (ref 6.5–8.1)
Total Bilirubin: 0.5 mg/dL (ref 0.3–1.2)

## 2018-04-27 LAB — CBC WITH DIFFERENTIAL/PLATELET
BASOS PCT: 1 %
Basophils Absolute: 0 10*3/uL (ref 0–0.1)
EOS ABS: 0.3 10*3/uL (ref 0–0.7)
EOS PCT: 5 %
HCT: 33.6 % — ABNORMAL LOW (ref 35.0–47.0)
Hemoglobin: 11.3 g/dL — ABNORMAL LOW (ref 12.0–16.0)
Lymphocytes Relative: 24 %
Lymphs Abs: 1.5 10*3/uL (ref 1.0–3.6)
MCH: 32.1 pg (ref 26.0–34.0)
MCHC: 33.5 g/dL (ref 32.0–36.0)
MCV: 95.7 fL (ref 80.0–100.0)
MONO ABS: 0.6 10*3/uL (ref 0.2–0.9)
MONOS PCT: 10 %
NEUTROS PCT: 60 %
Neutro Abs: 3.7 10*3/uL (ref 1.4–6.5)
PLATELETS: 200 10*3/uL (ref 150–440)
RBC: 3.51 MIL/uL — ABNORMAL LOW (ref 3.80–5.20)
RDW: 14.7 % — ABNORMAL HIGH (ref 11.5–14.5)
WBC: 6.2 10*3/uL (ref 3.6–11.0)

## 2018-04-27 MED ORDER — HEPARIN SOD (PORK) LOCK FLUSH 100 UNIT/ML IV SOLN
500.0000 [IU] | Freq: Once | INTRAVENOUS | Status: AC | PRN
  Administered 2018-04-27: 500 [IU]
  Filled 2018-04-27: qty 5

## 2018-04-27 MED ORDER — DEXTROSE 5 % IV SOLN
60.0000 mg | Freq: Once | INTRAVENOUS | Status: AC
  Administered 2018-04-27: 60 mg via INTRAVENOUS
  Filled 2018-04-27: qty 30

## 2018-04-27 MED ORDER — DEXAMETHASONE SODIUM PHOSPHATE 10 MG/ML IJ SOLN
10.0000 mg | Freq: Once | INTRAMUSCULAR | Status: AC
  Administered 2018-04-27: 10 mg via INTRAVENOUS
  Filled 2018-04-27: qty 1

## 2018-04-27 MED ORDER — SODIUM CHLORIDE 0.9% FLUSH
10.0000 mL | Freq: Once | INTRAVENOUS | Status: AC
  Administered 2018-04-27: 10 mL via INTRAVENOUS
  Filled 2018-04-27: qty 10

## 2018-04-27 MED ORDER — SODIUM CHLORIDE 0.9 % IV SOLN
500.0000 mg | Freq: Once | INTRAVENOUS | Status: AC
  Administered 2018-04-27: 500 mg via INTRAVENOUS
  Filled 2018-04-27: qty 25

## 2018-04-27 MED ORDER — HEPARIN SOD (PORK) LOCK FLUSH 100 UNIT/ML IV SOLN: 500.0000 [IU] | Freq: Once | INTRAVENOUS | Status: DC

## 2018-04-27 MED ORDER — SODIUM CHLORIDE 0.9 % IV SOLN
Freq: Once | INTRAVENOUS | Status: AC
  Administered 2018-04-27: 11:00:00 via INTRAVENOUS
  Filled 2018-04-27: qty 250

## 2018-04-27 MED ORDER — PALONOSETRON HCL INJECTION 0.25 MG/5ML
0.2500 mg | Freq: Once | INTRAVENOUS | Status: AC
  Administered 2018-04-27: 0.25 mg via INTRAVENOUS
  Filled 2018-04-27: qty 5

## 2018-04-27 NOTE — Progress Notes (Signed)
[facility] OFFICE PROGRESS NOTE  Patient Care Team: Center, [facility] as PCP - General (General Practice)  Cancer Staging No matching staging information was found for the patient.   Oncology History   # SEP 2018- MULTIPLE MYELOMA IgALamda [2.5 gm/dl; K/L= 88/1298]; STAGE III [beta 2 microglobulin=5.5] [presented with acute renal failure; anemia; NO hypercalcemia; Skeletal survey-Normal]; BMBx- 45% plasma cells; FISH-POSITIVE 11:14 translocation.[STANDARD-high RISK]/cyto-Normal; SEP 2018- PET- L3 posterior element lesion.   # 9/14- velcade SQ twice weekly/Dex 40 mg/week; OCT 5th 2018-Start R [42m]VD; 3cycles of RVD- PARTAL RESPONSE  # Jan 11th 2019-Dara-Rev-Dex; April 2019- BMBx- plasma cell -by CD-138/IHC-80% [baseline Sep 2018- 85% ]; HOLD transplant [dw Dr.Gasperatto]  # April 29th 2019 2019- carfil-Cyt-Dex; AUG 6th BMBx- 6% plasma cells; VGPR  # Acute renal failure [Dr.Singh; Proteinuria 1.5gm/day ]; acyclovir/Asprin -------------------------------------------------------------   DIAGNOSIS: _0  MULTIPLE MYELOMA  STAGE: III/HIGH RISK ;GOALS: CONTROL  CURRENT/MOST RECENT THERAPY [ April 29th 2019- KYPROLIS-CYTOXAN-DEX      [place] relapse ([place]   11/07/2017 -  Chemotherapy    The patient had palonosetron (ALOXI) injection 0.25 mg, 0.25 mg, Intravenous,  Once, 6 of 7 cycles Administration: 0.25 mg (11/21/2017), 0.25 mg (11/29/2017), 0.25 mg (12/05/2017), 0.25 mg (12/21/2017), 0.25 mg (12/28/2017), 0.25 mg (01/04/2018), 0.25 mg (01/18/2018), 0.25 mg (01/25/2018), 0.25 mg (02/01/2018), 0.25 mg (02/15/2018), 0.25 mg (02/22/2018), 0.25 mg (03/01/2018), 0.25 mg (03/15/2018), 0.25 mg (03/22/2018), 0.25 mg (03/30/2018), 0.25 mg (04/13/2018), 0.25 mg (04/20/2018), 0.25 mg (04/27/2018) cyclophosphamide (CYTOXAN) 500 mg in sodium chloride 0.9 % 250 mL chemo infusion, 540 mg, Intravenous,  Once, 6 of 7 cycles Administration: 500 mg (11/21/2017), 500 mg (11/29/2017), 500 mg  (12/05/2017), 500 mg (12/28/2017), 500 mg (01/04/2018), 500 mg (01/18/2018), 500 mg (01/25/2018), 500 mg (02/01/2018), 500 mg (02/15/2018), 500 mg (02/22/2018), 500 mg (03/01/2018), 500 mg (03/15/2018), 500 mg (03/30/2018), 500 mg (04/13/2018), 500 mg (04/20/2018), 500 mg (04/27/2018) carfilzomib (KYPROLIS) 36 mg in dextrose 5 % 50 mL chemo infusion, 20 mg/m2 = 36 mg, Intravenous, Once, 6 of 7 cycles Administration: 36 mg (11/21/2017), 36 mg (11/22/2017), 60 mg (11/29/2017), 60 mg (11/30/2017), 60 mg (12/05/2017), 60 mg (12/06/2017), 60 mg (12/21/2017), 60 mg (12/22/2017), 60 mg (12/28/2017), 60 mg (12/29/2017), 60 mg (01/04/2018), 60 mg (01/05/2018), 60 mg (01/18/2018), 60 mg (01/19/2018), 60 mg (01/25/2018), 60 mg (01/27/2018), 60 mg (02/01/2018), 60 mg (02/02/2018), 60 mg (02/15/2018), 60 mg (02/16/2018), 60 mg (02/22/2018), 60 mg (03/01/2018), 60 mg (03/02/2018), 60 mg (03/15/2018), 60 mg (03/16/2018), 60 mg (03/23/2018), 60 mg (03/30/2018), 60 mg (03/31/2018), 60 mg (04/13/2018), 60 mg (04/14/2018), 60 mg (04/20/2018), 60 mg (04/21/2018), 60 mg (04/27/2018)  for chemotherapy treatment.        INTERVAL HISTORY:  Brandi [id number].  female pleasant patient above history of relapsed multiple myeloma currently on Kyprolis Cytoxan is here for follow-up.  Finally patient has an appointment with [facility] on October 17th-with regards to transplant evaluation.  Denies any nausea vomiting.  Appetite is good.'s of mild swelling in the legs.  No shortness of breath or cough.  Review of Systems  Constitutional: Negative for chills, diaphoresis, fever, malaise/fatigue and weight loss.       Weight gain.  HENT: Negative for nosebleeds and sore throat.   Eyes: Negative for double vision.  Respiratory: Negative for cough, hemoptysis, sputum production, shortness of breath and wheezing.   Cardiovascular: Positive for leg swelling. Negative for chest pain, palpitations and orthopnea.  Gastrointestinal: Negative for abdominal pain, blood in  stool, constipation,  diarrhea, heartburn, melena, nausea and vomiting.  Genitourinary: Negative for dysuria, frequency and urgency.  Skin: Negative.  Negative for itching and rash.  Neurological: Negative for dizziness, tingling, focal weakness, weakness and headaches.  Endo/Heme/Allergies: Does not bruise/bleed easily.  Psychiatric/Behavioral: Negative for depression. The patient is not nervous/anxious and does not have insomnia.       PAST MEDICAL HISTORY :  Past Medical History:  Diagnosis Date  . Hypertension   . Multiple myeloma ([place])     PAST SURGICAL HISTORY :   Past Surgical History:  Procedure Laterality Date  . IR FLUORO GUIDE PORT INSERTION RIGHT  12/16/2017    FAMILY HISTORY :   Family History  Problem Relation Age of Onset  . Pneumonia Mother   . Seizures Father     SOCIAL HISTORY:   Social History   Tobacco Use  . Smoking status: Current Every Day Smoker    Packs/day: 0.50    Types: Cigarettes  . Smokeless tobacco: Never Used  Substance Use Topics  . Alcohol use: No  . Drug use: No    ALLERGIES:  has No Known Allergies.  MEDICATIONS:  Current Outpatient Medications  Medication Sig Dispense Refill  . acetaminophen (TYLENOL) 500 MG tablet Take 1 tablet (500 mg total) by mouth every 6 (six) hours as needed (TAKE 1 TABLET DAILY X 4 DAYS-STARTING 2 DAYS PRIOR TO CHEMOTHERAPY.). 30 tablet 0  . acyclovir (ZOVIRAX) 400 MG tablet One pill a day [to prevent shingles] 30 tablet 6  . albuterol (PROVENTIL HFA;VENTOLIN HFA) 108 (90 Base) MCG/ACT inhaler Inhale 2 puffs into the lungs every 6 (six) hours as needed for wheezing or shortness of breath. 1 Inhaler 2  . amLODipine (NORVASC) 10 MG tablet Take 1 tablet (10 mg total) by mouth daily. 30 tablet 4  . aspirin 325 MG tablet Take 1 tablet by mouth daily.    [name] dexamethasone (DECADRON) 4 MG tablet Take 10 tablets (40 mg total) by mouth once a week. 10 pills in AM with food. 80 tablet 2  . feeding supplement, ENSURE ENLIVE, (ENSURE  ENLIVE) LIQD Take 237 mLs by mouth 2 (two) times daily between meals. 60 Bottle 0  . lidocaine-prilocaine (EMLA) cream Apply 1 application topically as needed. Apply small amount to port site at least 1 hour prior to it being accessed, cover with plastic wrap 30 g 1  . loratadine (CLARITIN) 10 MG tablet Take 1 tablet (10 mg total) by mouth daily. Take daily x 4 days (start 2 days prior to chemo) 30 tablet 3  . montelukast (SINGULAIR) 10 MG tablet Take 1 tablet (10 mg total) by mouth at bedtime. Start 2 days prior to chemo. Take for 4 days total 30 tablet 3  . nystatin (MYCOSTATIN) 100000 UNIT/ML suspension Take 5 mLs (500,000 Units total) by mouth 4 (four) times daily. 60 mL 0  . pantoprazole (PROTONIX) 40 MG tablet Take 1 tablet (40 mg total) by mouth daily. 90 tablet 3  . polyethylene glycol (MIRALAX / GLYCOLAX) packet Take 17 g by mouth daily as needed for mild constipation. 14 each 0  . potassium chloride SA (K-DUR,KLOR-CON) 20 MEQ tablet TAKE 1 TABLET BY MOUTH TWICE DAILY 180 tablet 1  . ranitidine (ZANTAC) 150 MG tablet Take 1 tablet (150 mg total) by mouth 2 (two) times daily. START TAKING 2 DAYS PRIOR TO CHEMOTHERAPY. TAKE FOR A TOTAL OF 4 DAYS     No current facility-administered medications for this visit.  Facility-Administered Medications Ordered in Other Visits  Medication Dose Route Frequency Provider Last Rate Last Dose  . sodium chloride flush (NS) 0.9 % injection 10 mL  10 mL Intracatheter PRN [name], MD   10 mL at 02/23/18 1400    PHYSICAL EXAMINATION: ECOG PERFORMANCE STATUS: 1 - Symptomatic but completely ambulatory  BP (!) 152/84 (BP Location: Left Arm, Patient Position: Sitting)   Pulse (!) 57   Temp (!) 97.1 F (36.2 C) (Tympanic)   Resp 16   Wt 177 lb (80.3 kg)   BMI 32.37 kg/m   Filed Weights   04/27/18 0954  Weight: 177 lb (80.3 kg)    Physical Exam  Constitutional: She is oriented to person, place, and time and well-developed,  well-nourished, and in no distress.  Alone.  No acute distress.  HENT:  Head: Normocephalic and atraumatic.  Eyes: Pupils are equal, round, and reactive to light.  Neck: Normal range of motion. Neck supple.  Cardiovascular: Normal rate and regular rhythm.  Pulmonary/Chest: No respiratory distress. She has no wheezes.  Abdominal: Soft. Bowel sounds are normal. She exhibits no distension and no mass. There is no tenderness. There is no rebound and no guarding.  Musculoskeletal: Normal range of motion. She exhibits edema (Bilateral ankle edema.). She exhibits no tenderness.  Neurological: She is alert and oriented to person, place, and time.  Skin: Skin is warm.  Psychiatric: Affect normal.       LABORATORY DATA:  I have reviewed the data as listed    Component Value Date/Time   NA 144 04/27/2018 0918   K 3.6 04/27/2018 0918   CL 115 (H) 04/27/2018 0918   CO2 22 04/27/2018 0918   GLUCOSE 89 04/27/2018 0918   BUN 20 04/27/2018 0918   CREATININE 0.66 04/27/2018 0918   CALCIUM 9.4 04/27/2018 0918   PROT 6.3 (L) 04/27/2018 0918   ALBUMIN 4.0 04/27/2018 0918   AST 19 04/27/2018 0918   ALT 12 04/27/2018 0918   ALKPHOS 53 04/27/2018 0918   BILITOT 0.5 04/27/2018 0918   GFRNONAA >60 04/27/2018 0918   GFRAA >60 04/27/2018 0918    No results found for: SPEP, UPEP  Lab Results  Component Value Date   WBC 6.2 04/27/2018   NEUTROABS 3.7 04/27/2018   HGB 11.3 (L) 04/27/2018   HCT 33.6 (L) 04/27/2018   MCV 95.7 04/27/2018   PLT 200 04/27/2018      Chemistry      Component Value Date/Time   NA 144 04/27/2018 0918   K 3.6 04/27/2018 0918   CL 115 (H) 04/27/2018 0918   CO2 22 04/27/2018 0918   BUN 20 04/27/2018 0918   CREATININE 0.66 04/27/2018 0918      Component Value Date/Time   CALCIUM 9.4 04/27/2018 0918   ALKPHOS 53 04/27/2018 0918   AST 19 04/27/2018 0918   ALT 12 04/27/2018 0918   BILITOT 0.5 04/27/2018 0918       RADIOGRAPHIC STUDIES: I have personally  reviewed the radiological images as listed and agreed with the findings in the report. No results found.   ASSESSMENT & PLAN:  Multiple myeloma in relapse ([place]) #Multiple myeloma stage III high-risk cytogenetics; transplant eligible.  On Kyprolis-Cytoxan- dexamethasone; improving; Aug 21st BMBx- 6% plasma cells;SEP 19 th 2019- SPEP/K-L= light chains=Normal.  Patient is currently near CR.  # Proceed with #6 cycle day-15 of Kyprolis plus Cytoxan today.  Labs are adequate; [facility]. Hold further treatment until evaluation with  [facility]/ in preparation of ASCT plan.   #Bilateral ankle edema-continue IV steroids as part of treatment with Cytoxan/Kyprolis. STABLE.   # Skeletal lesions: Continue Xgeva q q. Monthly.   # Infection prophylaxis: Continue acyclovir/DVT prophylaxis.  On aspirin 325 mg daily. STABLE.   # follow up in 3 week/labs/x-geva/MD. patient will bring his labs-to be done as part of [facility]/myeloma evaluation.   Orders Placed This Encounter  Procedures  . CBC with Differential/Platelet    Standing Status:   Future    Standing Expiration Date:   04/28/2019  . Comprehensive metabolic panel    Standing Status:   Future    Standing Expiration Date:   04/28/2019   All questions were answered. The patient knows to call the clinic with any problems, questions or concerns.      [name], MD 04/28/2018 7:21 AM

## 2018-04-27 NOTE — Assessment & Plan Note (Addendum)
#  Multiple myeloma stage III high-risk cytogenetics; transplant eligible.  On Kyprolis-Cytoxan- dexamethasone; improving; Aug 21st BMBx- 6% plasma cells;SEP 19 th 2019- SPEP/K-L= light chains=Normal.  Patient is currently near CR.  # Proceed with #6 cycle day-15 of Kyprolis plus Cytoxan today.  Labs are adequate; [facility]. Hold further treatment until evaluation with [facility]/ in preparation of ASCT plan.   #Bilateral ankle edema-continue IV steroids as part of treatment with Cytoxan/Kyprolis. STABLE.   # Skeletal lesions: Continue Xgeva q q. Monthly.   # Infection prophylaxis: Continue acyclovir/DVT prophylaxis.  On aspirin 325 mg daily. STABLE.   # follow up in 3 week/labs/x-geva/MD. patient will bring his labs-to be done as part of [facility]/myeloma evaluation.

## 2018-04-28 ENCOUNTER — Telehealth: Payer: Self-pay | Admitting: *Deleted

## 2018-04-28 ENCOUNTER — Inpatient Hospital Stay: Payer: Medicare HMO

## 2018-04-28 ENCOUNTER — Other Ambulatory Visit: Payer: Self-pay | Admitting: *Deleted

## 2018-04-28 VITALS — BP 136/82 | HR 60 | Temp 98.6°F | Resp 18

## 2018-04-28 DIAGNOSIS — C9002 Multiple myeloma in relapse: Secondary | ICD-10-CM

## 2018-04-28 DIAGNOSIS — Z5112 Encounter for antineoplastic immunotherapy: Secondary | ICD-10-CM | POA: Diagnosis not present

## 2018-04-28 DIAGNOSIS — E876 Hypokalemia: Secondary | ICD-10-CM | POA: Diagnosis not present

## 2018-04-28 DIAGNOSIS — R0602 Shortness of breath: Secondary | ICD-10-CM | POA: Diagnosis not present

## 2018-04-28 MED ORDER — DEXTROSE 5 % IV SOLN
60.0000 mg | Freq: Once | INTRAVENOUS | Status: AC
  Administered 2018-04-28: 60 mg via INTRAVENOUS
  Filled 2018-04-28: qty 30

## 2018-04-28 MED ORDER — DEXAMETHASONE SODIUM PHOSPHATE 10 MG/ML IJ SOLN
10.0000 mg | Freq: Once | INTRAMUSCULAR | Status: AC
  Administered 2018-04-28: 10 mg via INTRAVENOUS

## 2018-04-28 MED ORDER — DEXAMETHASONE SODIUM PHOSPHATE 10 MG/ML IJ SOLN
INTRAMUSCULAR | Status: AC
  Filled 2018-04-28: qty 1

## 2018-04-28 MED ORDER — DEXAMETHASONE SODIUM PHOSPHATE 4 MG/ML IJ SOLN
10.0000 mg | Freq: Once | INTRAMUSCULAR | Status: DC
  Filled 2018-04-28: qty 3

## 2018-04-28 MED ORDER — DEXTROSE 5 % IV SOLN: 36.0000 mg/m2 | Freq: Once | INTRAVENOUS | Status: DC

## 2018-04-28 MED ORDER — SODIUM CHLORIDE 0.9 % IV SOLN
Freq: Once | INTRAVENOUS | Status: AC
  Administered 2018-04-28: 09:00:00 via INTRAVENOUS
  Filled 2018-04-28: qty 250

## 2018-04-28 MED ORDER — HEPARIN SOD (PORK) LOCK FLUSH 100 UNIT/ML IV SOLN
500.0000 [IU] | Freq: Once | INTRAVENOUS | Status: AC
  Administered 2018-04-28: 500 [IU] via INTRAVENOUS
  Filled 2018-04-28: qty 5

## 2018-04-28 NOTE — Telephone Encounter (Signed)
Left vm for Brandi [name] at [facility] Mar[facility] transplant team - [place]. Patient received a "myeloma lab kit for blood draws" pt is required to have blood drawn and then fed-[facility] to [facility]. This blood would need to be spun down and serum separated and the specimen placed on ice so that [facility] can 'run this lab specimen.' I left a msg for Brandi [name] to see if the labs could not be drawn either a [facility] or drawn in cancer center and results routed to Dr. Verlin Dike. The lab panels that are needed are the myeloma panels (immunofixation electrophrosis, SIEP, immunoglobulin profile,  kappa light chain,  And beta 2 microglobulin panels).

## 2018-04-28 NOTE — Telephone Encounter (Signed)
Brandi [name] returned my phone call. Ok to have labs drawn in cancer center and results routed to [place]. Patient notified to come in on 10/7 for lab drawns. md aware and ok labs to be drawn at cancer center.

## 2018-05-01 ENCOUNTER — Other Ambulatory Visit: Payer: Self-pay | Admitting: *Deleted

## 2018-05-01 ENCOUNTER — Ambulatory Visit
Admission: RE | Admit: 2018-05-01 | Discharge: 2018-05-01 | Disposition: A | Discharge status: Home / Self Care | Payer: Medicare HMO | Source: Ambulatory Visit | Attending: Oncology | Admitting: Oncology

## 2018-05-01 ENCOUNTER — Other Ambulatory Visit: Payer: Self-pay

## 2018-05-01 ENCOUNTER — Inpatient Hospital Stay (HOSPITAL_BASED_OUTPATIENT_CLINIC_OR_DEPARTMENT_OTHER): Payer: Medicare HMO | Admitting: Oncology

## 2018-05-01 ENCOUNTER — Inpatient Hospital Stay: Payer: Medicare HMO

## 2018-05-01 VITALS — BP 155/88 | HR 73 | Temp 97.2°F | Resp 20 | Wt 172.0 lb

## 2018-05-01 DIAGNOSIS — R0602 Shortness of breath: Secondary | ICD-10-CM | POA: Insufficient documentation

## 2018-05-01 DIAGNOSIS — R0982 Postnasal drip: Secondary | ICD-10-CM | POA: Diagnosis not present

## 2018-05-01 DIAGNOSIS — R05 Cough: Secondary | ICD-10-CM

## 2018-05-01 DIAGNOSIS — R059 Cough, unspecified: Secondary | ICD-10-CM

## 2018-05-01 DIAGNOSIS — E876 Hypokalemia: Secondary | ICD-10-CM

## 2018-05-01 DIAGNOSIS — R093 Abnormal sputum: Secondary | ICD-10-CM

## 2018-05-01 DIAGNOSIS — C9002 Multiple myeloma in relapse: Secondary | ICD-10-CM

## 2018-05-01 DIAGNOSIS — Z95828 Presence of other vascular implants and grafts: Secondary | ICD-10-CM

## 2018-05-01 DIAGNOSIS — R600 Localized edema: Secondary | ICD-10-CM | POA: Diagnosis not present

## 2018-05-01 DIAGNOSIS — I10 Essential (primary) hypertension: Secondary | ICD-10-CM | POA: Diagnosis not present

## 2018-05-01 DIAGNOSIS — Z5112 Encounter for antineoplastic immunotherapy: Secondary | ICD-10-CM | POA: Diagnosis not present

## 2018-05-01 LAB — CBC WITH DIFFERENTIAL/PLATELET
BASOS ABS: 0 10*3/uL (ref 0–0.1)
BASOS PCT: 1 %
EOS ABS: 0.3 10*3/uL (ref 0–0.7)
Eosinophils Relative: 5 %
HCT: 34.6 % — ABNORMAL LOW (ref 35.0–47.0)
HEMOGLOBIN: 12 g/dL (ref 12.0–16.0)
Lymphocytes Relative: 18 %
Lymphs Abs: 1.1 10*3/uL (ref 1.0–3.6)
MCH: 32.6 pg (ref 26.0–34.0)
MCHC: 34.5 g/dL (ref 32.0–36.0)
MCV: 94.3 fL (ref 80.0–100.0)
MONOS PCT: 7 %
Monocytes Absolute: 0.4 10*3/uL (ref 0.2–0.9)
NEUTROS ABS: 4.2 10*3/uL (ref 1.4–6.5)
NEUTROS PCT: 69 %
Platelets: 219 10*3/uL (ref 150–440)
RBC: 3.68 MIL/uL — ABNORMAL LOW (ref 3.80–5.20)
RDW: 14.5 % (ref 11.5–14.5)
WBC: 6 10*3/uL (ref 3.6–11.0)

## 2018-05-01 LAB — BASIC METABOLIC PANEL
ANION GAP: 8 (ref 5–15)
BUN: 9 mg/dL (ref 8–23)
CALCIUM: 8.8 mg/dL — AB (ref 8.9–10.3)
CHLORIDE: 109 mmol/L (ref 98–111)
CO2: 23 mmol/L (ref 22–32)
CREATININE: 0.57 mg/dL (ref 0.44–1.00)
GFR calc non Af Amer: >60 mL/min (ref >60)
Glucose, Bld: 93 mg/dL (ref 70–99)
Potassium: 2.7 mmol/L — CL (ref 3.5–5.1)
SODIUM: 140 mmol/L (ref 135–145)

## 2018-05-01 LAB — BRAIN NATRIURETIC PEPTIDE: B NATRIURETIC PEPTIDE 5: 110 pg/mL — AB (ref 0.0–100.0)

## 2018-05-01 LAB — TROPONIN I

## 2018-05-01 MED ORDER — HEPARIN SOD (PORK) LOCK FLUSH 100 UNIT/ML IV SOLN
500.0000 [IU] | Freq: Once | INTRAVENOUS | Status: AC
  Administered 2018-05-01: 500 [IU]
  Filled 2018-05-01: qty 5

## 2018-05-01 MED ORDER — SODIUM CHLORIDE 0.9 % IV SOLN
INTRAVENOUS | Status: DC
  Administered 2018-05-01: 12:00:00 via INTRAVENOUS
  Filled 2018-05-01: qty 250

## 2018-05-01 MED ORDER — POTASSIUM CHLORIDE CRYS ER 20 MEQ PO TBCR: 20.0000 meq | EXTENDED_RELEASE_TABLET | Freq: Two times a day (BID) | ORAL | 1 refills | Status: DC

## 2018-05-01 MED ORDER — IPRATROPIUM-ALBUTEROL 0.5-2.5 (3) MG/3ML IN SOLN
3.0000 mL | Freq: Once | RESPIRATORY_TRACT | Status: AC
  Administered 2018-05-01: 3 mL via RESPIRATORY_TRACT
  Filled 2018-05-01: qty 3

## 2018-05-01 MED ORDER — AMOXICILLIN-POT CLAVULANATE 875-125 MG PO TABS: 1.0000 | ORAL_TABLET | Freq: Two times a day (BID) | ORAL | 0 refills | Status: DC

## 2018-05-01 MED ORDER — SODIUM CHLORIDE 0.9% FLUSH
10.0000 mL | Freq: Once | INTRAVENOUS | Status: AC
  Administered 2018-05-01: 10 mL via INTRAVENOUS
  Filled 2018-05-01: qty 10

## 2018-05-01 MED ORDER — ALBUTEROL SULFATE HFA 108 (90 BASE) MCG/ACT IN AERS: 2.0000 | INHALATION_SPRAY | Freq: Four times a day (QID) | RESPIRATORY_TRACT | 2 refills | Status: DC | PRN

## 2018-05-01 MED ORDER — HYDROCOD POLST-CPM POLST ER 10-8 MG/5ML PO SUER: 5.0000 mL | Freq: Two times a day (BID) | ORAL | 0 refills | Status: DC

## 2018-05-01 MED ORDER — POTASSIUM CHLORIDE 20 MEQ/100ML IV SOLN
20.0000 meq | Freq: Once | INTRAVENOUS | Status: AC
  Administered 2018-05-01: 20 meq via INTRAVENOUS

## 2018-05-01 NOTE — Progress Notes (Signed)
Symptom Management Consult note [facility]  [phone] [phone]  Patient Care Team: Center, [facility] as PCP - General (General Practice) [name], [name]   Name of the patient: Brandi [name]  [id number]  15-Jan-1951   Date of visit: 05/01/2018  Diagnosis: Cough - Plan: DG Chest 2 View  Multiple myeloma in relapse ([place]) - Plan: potassium chloride SA (K-DUR,KLOR-CON) 20 MEQ tablet  Shortness of breath  Chief Complaint: Cough, congestion  Current Treatment: Multiple Myeloma  Oncology History: Patient was last seen by primary oncologist Dr. Rogue Bussing on 04/27/2018 prior to cycle 6-day 15 of Kyprolis and Cytoxan today. She was feeling well   Labs were acceptable for treatment.  No further treatments until evaluation by [facility] on 05/11/2018 to discuss transplant evaluation.  Patient complained of bilateral ankle edema thought to be due to steroids included with treatment.  Oncology History   # SEP 2018- MULTIPLE MYELOMA IgALamda [2.5 gm/dl; K/L= 88/1298]; STAGE III [beta 2 microglobulin=5.5] [presented with acute renal failure; anemia; NO hypercalcemia; Skeletal survey-Normal]; BMBx- 45% plasma cells; FISH-POSITIVE 11:14 translocation.[STANDARD-high RISK]/cyto-Normal; SEP 2018- PET- L3 posterior element lesion.   # 9/14- velcade SQ twice weekly/Dex 40 mg/week; OCT 5th 2018-Start R [12m]VD; 3cycles of RVD- PARTAL RESPONSE  # Jan 11th 2019-Dara-Rev-Dex; April 2019- BMBx- plasma cell -by CD-138/IHC-80% [baseline Sep 2018- 85% ]; HOLD transplant [dw Dr.Gasperatto]  # April 29th 2019 2019- carfil-Cyt-Dex; AUG 6th BMBx- 6% plasma cells; VGPR  # Acute renal failure [Dr.Singh; Proteinuria 1.5gm/day ]; acyclovir/Asprin -------------------------------------------------------------   DIAGNOSIS: '[ ]'  MULTIPLE MYELOMA  STAGE: III/HIGH RISK ;GOALS: CONTROL  CURRENT/MOST RECENT THERAPY  [ April 29th 2019- KYPROLIS-CYTOXAN-DEX      Multiple myeloma in relapse ([place]   11/07/2017 -  Chemotherapy    The patient had palonosetron (ALOXI) injection 0.25 mg, 0.25 mg, Intravenous,  Once, 6 of 7 cycles Administration: 0.25 mg (11/21/2017), 0.25 mg (11/29/2017), 0.25 mg (12/05/2017), 0.25 mg (12/21/2017), 0.25 mg (12/28/2017), 0.25 mg (01/04/2018), 0.25 mg (01/18/2018), 0.25 mg (01/25/2018), 0.25 mg (02/01/2018), 0.25 mg (02/15/2018), 0.25 mg (02/22/2018), 0.25 mg (03/01/2018), 0.25 mg (03/15/2018), 0.25 mg (03/22/2018), 0.25 mg (03/30/2018), 0.25 mg (04/13/2018), 0.25 mg (04/20/2018), 0.25 mg (04/27/2018) cyclophosphamide (CYTOXAN) 500 mg in sodium chloride 0.9 % 250 mL chemo infusion, 540 mg, Intravenous,  Once, 6 of 7 cycles Administration: 500 mg (11/21/2017), 500 mg (11/29/2017), 500 mg (12/05/2017), 500 mg (12/28/2017), 500 mg (01/04/2018), 500 mg (01/18/2018), 500 mg (01/25/2018), 500 mg (02/01/2018), 500 mg (02/15/2018), 500 mg (02/22/2018), 500 mg (03/01/2018), 500 mg (03/15/2018), 500 mg (03/30/2018), 500 mg (04/13/2018), 500 mg (04/20/2018), 500 mg (04/27/2018) carfilzomib (KYPROLIS) 36 mg in dextrose 5 % 50 mL chemo infusion, 20 mg/m2 = 36 mg, Intravenous, Once, 6 of 7 cycles Administration: 36 mg (11/21/2017), 36 mg (11/22/2017), 60 mg (11/29/2017), 60 mg (11/30/2017), 60 mg (12/05/2017), 60 mg (12/06/2017), 60 mg (12/21/2017), 60 mg (12/22/2017), 60 mg (12/28/2017), 60 mg (12/29/2017), 60 mg (01/04/2018), 60 mg (01/05/2018), 60 mg (01/18/2018), 60 mg (01/19/2018), 60 mg (01/25/2018), 60 mg (01/27/2018), 60 mg (02/01/2018), 60 mg (02/02/2018), 60 mg (02/15/2018), 60 mg (02/16/2018), 60 mg (02/22/2018), 60 mg (03/01/2018), 60 mg (03/02/2018), 60 mg (03/15/2018), 60 mg (03/16/2018), 60 mg (03/23/2018), 60 mg (03/30/2018), 60 mg (03/31/2018), 60 mg (04/13/2018), 60 mg (04/14/2018), 60 mg (04/20/2018), 60 mg (04/21/2018), 60 mg (04/27/2018)  for chemotherapy treatment.      Subjective Data:   Subjective:     VAZAIAH MELLOis a 67  y.o. female here for evaluation  of a cough. Onset of symptoms was 2 days ago. Symptoms have been gradually worsening since that time. The cough is productive of yellow, green and tenacious sputum and is aggravated by reclining position. Associated symptoms include: postnasal drip, shortness of breath and sputum production. Patient does not have a history of asthma. Patient does have a history of environmental allergens. Patient has not traveled recently. Patient does have a history of smoking. Patient has not had a previous chest x-ray. Patient has not had a PPD done.  Patient uses albuterol inhaler as needed for shortness of breath.  Has not used it recently.   The following portions of the patient's history were reviewed and updated as appropriate: allergies, current medications, past family history, past medical history, past social history, past surgical history and problem list.  Review of Systems A comprehensive review of systems was negative except for: Constitutional: positive for fatigue and malaise Respiratory: positive for cough, dyspnea on exertion and sputum Cardiovascular: positive for dyspnea and fatigue Gastrointestinal: positive for reflux symptoms    Objective:    Oxygen saturation 98% on room air BP (!) 155/88 (Patient Position: Sitting)   Pulse 73   Temp (!) 97.2 F (36.2 C) (Tympanic)   Resp 20   Wt 172 lb (78 kg)   SpO2 99%   BMI 31.46 kg/m  General appearance: alert, fatigued and mild distress Ears: normal TM's and external ear canals both ears Throat: lips, mucosa, and tongue normal; teeth and gums normal Lungs: wheezes LLL Heart: regular rate and rhythm, S1, S2 normal, no murmur, click, rub or gallop Abdomen: soft, non-tender; bowel sounds normal; no masses,  no organomegaly Extremities: edema BLE + 1 pitting Neurologic: Grossly normal    Assessment:    COPD with exacerbation, Cough, Sinusitis and URI with Post Nasal Drip    Plan:    Antibiotics per medication orders. Antitussives per  medication orders. Avoid exposure to tobacco smoke and fumes. B-agonist inhaler. Call if shortness of breath worsens, blood in sputum, change in character of cough, development of fever or chills, inability to maintain nutrition and hydration. Avoid exposure to tobacco smoke and fumes. Chest x-ray. Breathing tx in clinic    Multiple myeloma: s/p cycle 6 kyprolis-Cytoxan-dexamethasone.  Tolerating well.   Last given on 04/27/2018.  Scheduled to be seen at [facility] on 05/11/2018 for further evaluation of a possible bone marrow transplant.  Return to clinic on 05/18/2018 to discuss treatment planning with Dr. Rogue Bussing.  Cough/congestion/wheezing: We will get stat chest x-ray and give DuoNeb while in clinic.  Chest x-ray did not reveal any acute abnormalities.  Rx Tussionex for cough BID.  Hypokalemia: Uncertain of why potassium dropped so significantly within the past week.  Patient denies diarrhea.  Will get stat EKG to rule out arrhythmias. Possible new U wave noted on EKG. Likely d/t hypokalemia.  Potassium previously stopped.  While in clinic, patient will receive 20 mEq potassium with IV fluids.  Will re-start give 20 mEq potassium while in clinic and recheck EKG prior to discharge.    Shortness of breath/edema lower extremities: Given toxicities with Kyprolis, specifically cardiac, Dr. Rogue Bussing added a BNP and troponin.  Stat EKG as mentioned above.  Troponin was negative and BNP was essentially normal.  Slightly elevated at 110.   Sinus tenderness/cough with colorful sputum production: Immunosuppressed given recent chemo. Will try Rx Augmentin BID for 10 days.   Addendum: Spoke to patient on 05/04/2018 and noted to have  marked improvement after initiating antibiotics and cough medicine.  No fevers.  No new complaints.  Greater than 50% was spent in counseling and coordination of care with this patient including but not limited to discussion of the relevant topics above (See A&P) including, but  not limited to diagnosis and management of acute and chronic medical conditions.   Faythe Casa, NP 05/01/2018 12:07 PM

## 2018-05-01 NOTE — Progress Notes (Signed)
05/01/18 - 1103 am - Critical potassium of 2.7 called to Renita Papa, RN by Collene Mares in cancer center lab. Sonia Baller, NP notified 1105 am

## 2018-05-01 NOTE — Progress Notes (Signed)
Patient presented to clinic today for lab only. C/o shortness of breath/cough/white sputum production/sore throat since Saturday. Denies any fever. Labs added per v/o Dr. Josefa Half, NP. Dr. B requested BNP and troponin level.

## 2018-05-02 LAB — BETA 2 MICROGLOBULIN, SERUM: BETA 2 MICROGLOBULIN: 1.5 mg/L (ref 0.6–2.4)

## 2018-05-04 ENCOUNTER — Inpatient Hospital Stay: Payer: Medicare HMO

## 2018-05-04 DIAGNOSIS — C9002 Multiple myeloma in relapse: Secondary | ICD-10-CM | POA: Diagnosis not present

## 2018-05-04 DIAGNOSIS — R0602 Shortness of breath: Secondary | ICD-10-CM | POA: Diagnosis not present

## 2018-05-04 DIAGNOSIS — E876 Hypokalemia: Secondary | ICD-10-CM | POA: Diagnosis not present

## 2018-05-04 DIAGNOSIS — Z5112 Encounter for antineoplastic immunotherapy: Secondary | ICD-10-CM | POA: Diagnosis not present

## 2018-05-05 LAB — KAPPA/LAMBDA LIGHT CHAINS
KAPPA FREE LGHT CHN: 3.5 mg/L (ref 3.3–19.4)
Kappa, lambda light chain ratio: 0.55 (ref 0.26–1.65)
Lambda free light chains: 6.4 mg/L (ref 5.7–26.3)

## 2018-05-08 LAB — MULTIPLE MYELOMA PANEL, SERUM
ALBUMIN/GLOB SERPL: 1.7 (ref 0.7–1.7)
ALPHA 1: 0.1 g/dL (ref 0.0–0.4)
ALPHA2 GLOB SERPL ELPH-MCNC: 0.8 g/dL (ref 0.4–1.0)
Albumin SerPl Elph-Mcnc: 4 g/dL (ref 2.9–4.4)
B-GLOBULIN SERPL ELPH-MCNC: 1.2 g/dL (ref 0.7–1.3)
Gamma Glob SerPl Elph-Mcnc: 0.3 g/dL — ABNORMAL LOW (ref 0.4–1.8)
Globulin, Total: 2.5 g/dL (ref 2.2–3.9)
IGG (IMMUNOGLOBIN G), SERUM: 412 mg/dL — AB (ref 700–1600)
IGM (IMMUNOGLOBULIN M), SRM: 7 mg/dL — AB (ref 26–217)
IgA: 76 mg/dL — ABNORMAL LOW (ref 87–352)
TOTAL PROTEIN ELP: 6.5 g/dL (ref 6.0–8.5)

## 2018-05-11 DIAGNOSIS — C9000 Multiple myeloma not having achieved remission: Secondary | ICD-10-CM | POA: Diagnosis not present

## 2018-05-16 DIAGNOSIS — R9431 Abnormal electrocardiogram [ECG] [EKG]: Secondary | ICD-10-CM | POA: Diagnosis not present

## 2018-05-16 DIAGNOSIS — C9000 Multiple myeloma not having achieved remission: Secondary | ICD-10-CM | POA: Diagnosis not present

## 2018-05-16 DIAGNOSIS — I081 Rheumatic disorders of both mitral and tricuspid valves: Secondary | ICD-10-CM | POA: Diagnosis not present

## 2018-05-16 DIAGNOSIS — I340 Nonrheumatic mitral (valve) insufficiency: Secondary | ICD-10-CM | POA: Diagnosis not present

## 2018-05-16 DIAGNOSIS — R918 Other nonspecific abnormal finding of lung field: Secondary | ICD-10-CM | POA: Diagnosis not present

## 2018-05-18 ENCOUNTER — Other Ambulatory Visit: Payer: Self-pay

## 2018-05-18 ENCOUNTER — Inpatient Hospital Stay (HOSPITAL_BASED_OUTPATIENT_CLINIC_OR_DEPARTMENT_OTHER): Payer: Medicare HMO | Admitting: Internal Medicine

## 2018-05-18 ENCOUNTER — Inpatient Hospital Stay: Payer: Medicare HMO

## 2018-05-18 VITALS — BP 144/84 | HR 71 | Temp 97.3°F | Resp 18 | Wt 173.8 lb

## 2018-05-18 DIAGNOSIS — Z5112 Encounter for antineoplastic immunotherapy: Secondary | ICD-10-CM | POA: Diagnosis not present

## 2018-05-18 DIAGNOSIS — R69 Illness, unspecified: Secondary | ICD-10-CM | POA: Diagnosis not present

## 2018-05-18 DIAGNOSIS — E876 Hypokalemia: Secondary | ICD-10-CM

## 2018-05-18 DIAGNOSIS — F17210 Nicotine dependence, cigarettes, uncomplicated: Secondary | ICD-10-CM | POA: Diagnosis not present

## 2018-05-18 DIAGNOSIS — C9002 Multiple myeloma in relapse: Secondary | ICD-10-CM

## 2018-05-18 DIAGNOSIS — R0602 Shortness of breath: Secondary | ICD-10-CM | POA: Diagnosis not present

## 2018-05-18 DIAGNOSIS — M899 Disorder of bone, unspecified: Secondary | ICD-10-CM | POA: Diagnosis not present

## 2018-05-18 DIAGNOSIS — R600 Localized edema: Secondary | ICD-10-CM | POA: Diagnosis not present

## 2018-05-18 LAB — COMPREHENSIVE METABOLIC PANEL
ALK PHOS: 45 U/L (ref 38–126)
ALT: 11 U/L (ref 0–44)
AST: 16 U/L (ref 15–41)
Albumin: 4.4 g/dL (ref 3.5–5.0)
Anion gap: 7 (ref 5–15)
BILIRUBIN TOTAL: 0.6 mg/dL (ref 0.3–1.2)
BUN: 17 mg/dL (ref 8–23)
CALCIUM: 9.1 mg/dL (ref 8.9–10.3)
CHLORIDE: 111 mmol/L (ref 98–111)
CO2: 23 mmol/L (ref 22–32)
CREATININE: 0.67 mg/dL (ref 0.44–1.00)
GFR calc Af Amer: >60 mL/min (ref >60)
Glucose, Bld: 112 mg/dL — ABNORMAL HIGH (ref 70–99)
Potassium: 3.6 mmol/L (ref 3.5–5.1)
Sodium: 141 mmol/L (ref 135–145)
TOTAL PROTEIN: 6.9 g/dL (ref 6.5–8.1)

## 2018-05-18 LAB — CBC WITH DIFFERENTIAL/PLATELET
ABS IMMATURE GRANULOCYTES: 0.01 10*3/uL (ref 0.00–0.07)
Basophils Absolute: 0.1 10*3/uL (ref 0.0–0.1)
Basophils Relative: 1 %
Eosinophils Absolute: 0.5 10*3/uL (ref 0.0–0.5)
Eosinophils Relative: 8 %
HEMATOCRIT: 35.7 % — AB (ref 36.0–46.0)
HEMOGLOBIN: 11.3 g/dL — AB (ref 12.0–15.0)
IMMATURE GRANULOCYTES: 0 %
LYMPHS ABS: 2.1 10*3/uL (ref 0.7–4.0)
Lymphocytes Relative: 38 %
MCH: 30.5 pg (ref 26.0–34.0)
MCHC: 31.7 g/dL (ref 30.0–36.0)
MCV: 96.2 fL (ref 80.0–100.0)
MONO ABS: 0.4 10*3/uL (ref 0.1–1.0)
Monocytes Relative: 7 %
NRBC: 0 % (ref 0.0–0.2)
Neutro Abs: 2.5 10*3/uL (ref 1.7–7.7)
Neutrophils Relative %: 46 %
Platelets: 223 10*3/uL (ref 150–400)
RBC: 3.71 MIL/uL — ABNORMAL LOW (ref 3.87–5.11)
RDW: 13 % (ref 11.5–15.5)
WBC: 5.5 10*3/uL (ref 4.0–10.5)

## 2018-05-18 MED ORDER — DENOSUMAB 120 MG/1.7ML ~~LOC~~ SOLN
120.0000 mg | Freq: Once | SUBCUTANEOUS | Status: AC
  Administered 2018-05-18: 120 mg via SUBCUTANEOUS
  Filled 2018-05-18: qty 1.7

## 2018-05-18 MED ORDER — HEPARIN SOD (PORK) LOCK FLUSH 100 UNIT/ML IV SOLN
500.0000 [IU] | Freq: Once | INTRAVENOUS | Status: AC
  Administered 2018-05-18: 500 [IU] via INTRAVENOUS
  Filled 2018-05-18: qty 5

## 2018-05-18 NOTE — Progress Notes (Signed)
Here for follow up . Per pt " im doing just fun " being treated for " sinus infection" per pt

## 2018-05-18 NOTE — Assessment & Plan Note (Addendum)
#  Multiple myeloma stage III high-risk cytogenetics; transplant eligible.  On Kyprolis-Cytoxan- dexamethasone; improving; Aug 21st BMBx- 6% plasma cells;SEP 19 th 2019- SPEP/K-L= light chains=Normal.  Patient is currently near CR.  # Proceed with #6 cycle day-15 of Kyprolis plus Cytoxan today.  Labs are adequate; [facility]. Hold further treatment until evaluation with [facility]/ in preparation of ASCT plan.   #Bilateral ankle edema-continue IV steroids as part of treatment with Cytoxan/Kyprolis. STABLE.   # Skeletal lesions: Continue Xgeva q q. Monthly.   # Infection prophylaxis: Continue acyclovir/DVT prophylaxis.  On aspirin 325 mg daily. STABLE.   # follow up to decided based on nov 21st 2019- DOT  # DISPOSITION: # de-access today; X-geva today; # follow up on dec 19th/labs- cbc/cmp

## 2018-05-18 NOTE — Progress Notes (Signed)
[place] OFFICE PROGRESS NOTE  Patient Care Team: Center, [facility] as PCP - General ([place]) [name], [name]  Cancer Staging No matching staging information was found for the patient.   Oncology History   # SEP 2018- MULTIPLE MYELOMA IgALamda [2.5 gm/dl; K/L= 88/1298]; STAGE III [beta 2 microglobulin=5.5] [presented with acute renal failure; anemia; NO hypercalcemia; Skeletal survey-Normal]; BMBx- 45% plasma cells; FISH-POSITIVE 11:14 translocation.[STANDARD-high RISK]/cyto-Normal; SEP 2018- PET- L3 posterior element lesion.   # 9/14- velcade SQ twice weekly/Dex 40 mg/week; OCT 5th 2018-Start R [84m]VD; 3cycles of RVD- PARTAL RESPONSE  # Jan 11th 2019-Dara-Rev-Dex; April 2019- BMBx- plasma cell -by CD-138/IHC-80% [baseline Sep 2018- 85% ]; HOLD transplant [dw Dr.Gasperatto]  # April 29th 2019 2019- carfil-Cyt-Dex; AUG 6th BMBx- 6% plasma cells; VGPR  # Acute renal failure [Dr.Singh; Proteinuria 1.5gm/day ]; acyclovir/Asprin -------------------------------------------------------------   DIAGNOSIS: '[ ]'  MULTIPLE MYELOMA  STAGE: III/HIGH RISK ;GOALS: CONTROL  CURRENT/MOST RECENT THERAPY [ April 29th 2019- KYPROLIS-CYTOXAN-DEX      [place]   11/07/2017 -  Chemotherapy    The patient had palonosetron (ALOXI) injection 0.25 mg, 0.25 mg, Intravenous,  Once, 6 of 7 cycles Administration: 0.25 mg (11/21/2017), 0.25 mg (11/29/2017), 0.25 mg (12/05/2017), 0.25 mg (12/21/2017), 0.25 mg (12/28/2017), 0.25 mg (01/04/2018), 0.25 mg (01/18/2018), 0.25 mg (01/25/2018), 0.25 mg (02/01/2018), 0.25 mg (02/15/2018), 0.25 mg (02/22/2018), 0.25 mg (03/01/2018), 0.25 mg (03/15/2018), 0.25 mg (03/22/2018), 0.25 mg (03/30/2018), 0.25 mg (04/13/2018), 0.25 mg (04/20/2018), 0.25 mg (04/27/2018) cyclophosphamide (CYTOXAN) 500 mg in sodium chloride 0.9 % 250 mL chemo infusion, 540 mg, Intravenous,  Once, 6 of 7  cycles Administration: 500 mg (11/21/2017), 500 mg (11/29/2017), 500 mg (12/05/2017), 500 mg (12/28/2017), 500 mg (01/04/2018), 500 mg (01/18/2018), 500 mg (01/25/2018), 500 mg (02/01/2018), 500 mg (02/15/2018), 500 mg (02/22/2018), 500 mg (03/01/2018), 500 mg (03/15/2018), 500 mg (03/30/2018), 500 mg (04/13/2018), 500 mg (04/20/2018), 500 mg (04/27/2018) carfilzomib (KYPROLIS) 36 mg in dextrose 5 % 50 mL chemo infusion, 20 mg/m2 = 36 mg, Intravenous, Once, 6 of 7 cycles Administration: 36 mg (11/21/2017), 36 mg (11/22/2017), 60 mg (11/29/2017), 60 mg (11/30/2017), 60 mg (12/05/2017), 60 mg (12/06/2017), 60 mg (12/21/2017), 60 mg (12/22/2017), 60 mg (12/28/2017), 60 mg (12/29/2017), 60 mg (01/04/2018), 60 mg (01/05/2018), 60 mg (01/18/2018), 60 mg (01/19/2018), 60 mg (01/25/2018), 60 mg (01/27/2018), 60 mg (02/01/2018), 60 mg (02/02/2018), 60 mg (02/15/2018), 60 mg (02/16/2018), 60 mg (02/22/2018), 60 mg (03/01/2018), 60 mg (03/02/2018), 60 mg (03/15/2018), 60 mg (03/16/2018), 60 mg (03/23/2018), 60 mg (03/30/2018), 60 mg (03/31/2018), 60 mg (04/13/2018), 60 mg (04/14/2018), 60 mg (04/20/2018), 60 mg (04/21/2018), 60 mg (04/27/2018)  for chemotherapy treatment.        INTERVAL HISTORY:  Brandi [id number].  female pleasant patient above history of relapsed multiple myeloma currently on Kyprolis Cytoxan is here for follow-up.  [facility]   Her appetite is good.  No weight loss.  No nausea no vomiting.  No cough or shortness of breath or chest pain.  Patient is anxious given upcoming stem cell transplant plan.   Review of Systems  Constitutional: Negative for chills, diaphoresis, fever, malaise/fatigue and weight loss.       Weight gain.  HENT: Negative for nosebleeds and sore throat.   Eyes: Negative for double vision.  Respiratory: Negative for cough, hemoptysis, sputum production, shortness of breath and wheezing.   Cardiovascular: Positive for  leg swelling. Negative for chest pain, palpitations  and orthopnea.  Gastrointestinal: Negative for abdominal pain, blood in stool, constipation, diarrhea, heartburn, melena, nausea and vomiting.  Genitourinary: Negative for dysuria, frequency and urgency.  Skin: Negative.  Negative for itching and rash.  Neurological: Negative for dizziness, tingling, focal weakness, weakness and headaches.  Endo/Heme/Allergies: Does not bruise/bleed easily.  Psychiatric/Behavioral: Negative for depression. The patient is not nervous/anxious and does not have insomnia.       PAST MEDICAL HISTORY :  Past Medical History:  Diagnosis Date  . Hypertension   . Multiple myeloma ([place])     PAST SURGICAL HISTORY :   Past Surgical History:  Procedure Laterality Date  . IR FLUORO GUIDE PORT INSERTION RIGHT  12/16/2017    FAMILY HISTORY :   Family History  Problem Relation Age of Onset  . Pneumonia Mother   . Seizures Father     SOCIAL HISTORY:   Social History   Tobacco Use  . Smoking status: Current Every Day Smoker    Packs/day: 0.50    Types: Cigarettes  . Smokeless tobacco: Never Used  Substance Use Topics  . Alcohol use: No  . Drug use: No    ALLERGIES:  has No Known Allergies.  MEDICATIONS:  Current Outpatient Medications  Medication Sig Dispense Refill  . acetaminophen (TYLENOL) 500 MG tablet Take 1 tablet (500 mg total) by mouth every 6 (six) hours as needed (TAKE 1 TABLET DAILY X 4 DAYS-STARTING 2 DAYS PRIOR TO CHEMOTHERAPY.). 30 tablet 0  . acyclovir (ZOVIRAX) 400 MG tablet One pill a day [to prevent shingles] 30 tablet 6  . amLODipine (NORVASC) 10 MG tablet Take 1 tablet (10 mg total) by mouth daily. 30 tablet 4  . amoxicillin-clavulanate (AUGMENTIN) 875-125 MG tablet Take 1 tablet by mouth 2 (two) times daily. 20 tablet 0  . aspirin 325 MG tablet Take 1 tablet by mouth daily.    . chlorpheniramine-HYDROcodone (TUSSIONEX) 10-8 MG/5ML SUER Take 5 mLs by mouth 2 (two) times daily. 140 mL 0  . feeding supplement, ENSURE ENLIVE,  (ENSURE ENLIVE) LIQD Take 237 mLs by mouth 2 (two) times daily between meals. 60 Bottle 0  . lidocaine-prilocaine (EMLA) cream Apply 1 application topically as needed. Apply small amount to port site at least 1 hour prior to it being accessed, cover with plastic wrap 30 g 1  . pantoprazole (PROTONIX) 40 MG tablet Take 1 tablet (40 mg total) by mouth daily. 90 tablet 3  . potassium chloride SA (K-DUR,KLOR-CON) 20 MEQ tablet Take 1 tablet (20 mEq total) by mouth 2 (two) times daily. 14 tablet 1  . ranitidine (ZANTAC) 150 MG tablet Take 1 tablet (150 mg total) by mouth 2 (two) times daily. START TAKING 2 DAYS PRIOR TO CHEMOTHERAPY. TAKE FOR A TOTAL OF 4 DAYS    . albuterol (PROVENTIL HFA;VENTOLIN HFA) 108 (90 Base) MCG/ACT inhaler Inhale 2 puffs into the lungs every 6 (six) hours as needed for wheezing or shortness of breath. (Patient not taking: Reported on 05/18/2018) 1 Inhaler 2  . dexamethasone (DECADRON) 4 MG tablet Take 10 tablets (40 mg total) by mouth once a week. 10 pills in AM with food. (Patient not taking: Reported on 05/18/2018) 80 tablet 2  . loratadine (CLARITIN) 10 MG tablet Take 1 tablet (10 mg total) by mouth daily. Take daily x 4 days (start 2 days prior to chemo) (Patient not taking: Reported on 05/18/2018) 30 tablet 3  . montelukast (SINGULAIR) 10 MG tablet Take 1  tablet (10 mg total) by mouth at bedtime. Start 2 days prior to chemo. Take for 4 days total (Patient not taking: Reported on 05/18/2018) 30 tablet 3  . nystatin (MYCOSTATIN) 100000 UNIT/ML suspension Take 5 mLs (500,000 Units total) by mouth 4 (four) times daily. (Patient not taking: Reported on 05/18/2018) 60 mL 0  . polyethylene glycol (MIRALAX / GLYCOLAX) packet Take 17 g by mouth daily as needed for mild constipation. (Patient not taking: Reported on 05/18/2018) 14 each 0  . Vitamin D, Ergocalciferol, (DRISDOL) 50000 units CAPS capsule Take by mouth.     No current facility-administered medications for this visit.     Facility-Administered Medications Ordered in Other Visits  Medication Dose Route Frequency Provider Last Rate Last Dose  . 0.9 %  sodium chloride infusion   Intravenous Continuous [name], MD   Stopped at 05/01/18 1222  . sodium chloride flush (NS) 0.9 % injection 10 mL  10 mL Intracatheter PRN [name], MD   10 mL at 02/23/18 1400    PHYSICAL EXAMINATION: ECOG PERFORMANCE STATUS: 1 - Symptomatic but completely ambulatory  BP (!) 144/84 (BP Location: Left Arm, Patient Position: Sitting)   Pulse 71   Temp (!) 97.3 F (36.3 C) (Tympanic)   Resp 18   Wt 173 lb 12.8 oz (78.8 kg)   BMI 31.79 kg/m   Filed Weights   05/18/18 1007  Weight: 173 lb 12.8 oz (78.8 kg)    Physical Exam  Constitutional: She is oriented to person, place, and time and well-developed, well-nourished, and in no distress.  Alone.  No acute distress.  HENT:  Head: Normocephalic and atraumatic.  Eyes: Pupils are equal, round, and reactive to light.  Neck: Normal range of motion. Neck supple.  Cardiovascular: Normal rate and regular rhythm.  Pulmonary/Chest: No respiratory distress. She has no wheezes.  Abdominal: Soft. Bowel sounds are normal. She exhibits no distension and no mass. There is no tenderness. There is no rebound and no guarding.  Musculoskeletal: Normal range of motion. She exhibits edema (Bilateral ankle edema.). She exhibits no tenderness.  Neurological: She is alert and oriented to person, place, and time.  Skin: Skin is warm.  Psychiatric: Affect normal.       LABORATORY DATA:  I have reviewed the data as listed    Component Value Date/Time   NA 141 05/18/2018 0901   K 3.6 05/18/2018 0901   CL 111 05/18/2018 0901   CO2 23 05/18/2018 0901   GLUCOSE 112 (H) 05/18/2018 0901   BUN 17 05/18/2018 0901   CREATININE 0.67 05/18/2018 0901   CALCIUM 9.1 05/18/2018 0901   PROT 6.9 05/18/2018 0901   ALBUMIN 4.4 05/18/2018 0901   AST 16 05/18/2018 0901   ALT 11  05/18/2018 0901   ALKPHOS 45 05/18/2018 0901   BILITOT 0.6 05/18/2018 0901   GFRNONAA >60 05/18/2018 0901   GFRAA >60 05/18/2018 0901    No results found for: SPEP, UPEP  Lab Results  Component Value Date   WBC 5.5 05/18/2018   NEUTROABS 2.5 05/18/2018   HGB 11.3 (L) 05/18/2018   HCT 35.7 (L) 05/18/2018   MCV 96.2 05/18/2018   PLT 223 05/18/2018      Chemistry      Component Value Date/Time   NA 141 05/18/2018 0901   K 3.6 05/18/2018 0901   CL 111 05/18/2018 0901   CO2 23 05/18/2018 0901   BUN 17 05/18/2018 0901   CREATININE 0.67 05/18/2018 0901  Component Value Date/Time   CALCIUM 9.1 05/18/2018 0901   ALKPHOS 45 05/18/2018 0901   AST 16 05/18/2018 0901   ALT 11 05/18/2018 0901   BILITOT 0.6 05/18/2018 0901       RADIOGRAPHIC STUDIES: I have personally reviewed the radiological images as listed and agreed with the findings in the report. No results found.   ASSESSMENT & PLAN:  Multiple myeloma in relapse ([place]) #Multiple myeloma stage III high-risk cytogenetics; transplant eligible.  On Kyprolis-Cytoxan- dexamethasone; improving; Aug 21st BMBx- 6% plasma cells;SEP 19 th 2019- SPEP/K-L= light chains=Normal.  Patient is currently near CR.  # Proceed with #6 cycle day-15 of Kyprolis plus Cytoxan today.  Labs are adequate; [facility] evaluation with [facility] on [facility]. Hold further treatment until evaluation with [facility]/ in preparation of ASCT plan.   #Bilateral ankle edema-continue IV steroids as part of treatment with Cytoxan/Kyprolis. STABLE.   # Skeletal lesions: Continue Xgeva q q. Monthly.   # Infection prophylaxis: Continue acyclovir/DVT prophylaxis.  On aspirin 325 mg daily. STABLE.   # follow up to decided based on nov 21st 2019- DOT  # DISPOSITION: # de-access today; X-geva today; # follow up on dec 19th/labs- cbc/cmp     No orders of the defined types were placed in this encounter.  All questions were answered. The patient knows to call the clinic  with any problems, questions or concerns.      [name], MD 05/23/2018 12:29 PM

## 2018-05-26 DIAGNOSIS — I10 Essential (primary) hypertension: Secondary | ICD-10-CM | POA: Diagnosis not present

## 2018-05-26 DIAGNOSIS — E7800 Pure hypercholesterolemia, unspecified: Secondary | ICD-10-CM | POA: Diagnosis not present

## 2018-05-26 DIAGNOSIS — Z9189 Other specified personal risk factors, not elsewhere classified: Secondary | ICD-10-CM | POA: Diagnosis not present

## 2018-05-26 DIAGNOSIS — C9000 Multiple myeloma not having achieved remission: Secondary | ICD-10-CM | POA: Diagnosis not present

## 2018-05-26 DIAGNOSIS — R0609 Other forms of dyspnea: Secondary | ICD-10-CM | POA: Diagnosis not present

## 2018-05-29 DIAGNOSIS — I10 Essential (primary) hypertension: Secondary | ICD-10-CM | POA: Insufficient documentation

## 2018-05-30 DIAGNOSIS — Z01818 Encounter for other preprocedural examination: Secondary | ICD-10-CM | POA: Diagnosis not present

## 2018-05-30 DIAGNOSIS — D649 Anemia, unspecified: Secondary | ICD-10-CM | POA: Diagnosis not present

## 2018-05-30 DIAGNOSIS — C9000 Multiple myeloma not having achieved remission: Secondary | ICD-10-CM | POA: Diagnosis not present

## 2018-05-30 DIAGNOSIS — R0602 Shortness of breath: Secondary | ICD-10-CM | POA: Diagnosis not present

## 2018-05-30 DIAGNOSIS — I10 Essential (primary) hypertension: Secondary | ICD-10-CM | POA: Diagnosis not present

## 2018-05-30 DIAGNOSIS — N289 Disorder of kidney and ureter, unspecified: Secondary | ICD-10-CM | POA: Diagnosis not present

## 2018-05-30 DIAGNOSIS — Z9484 Stem cells transplant status: Secondary | ICD-10-CM | POA: Diagnosis not present

## 2018-05-30 DIAGNOSIS — C9001 Multiple myeloma in remission: Secondary | ICD-10-CM | POA: Diagnosis not present

## 2018-05-30 DIAGNOSIS — R69 Illness, unspecified: Secondary | ICD-10-CM | POA: Diagnosis not present

## 2018-06-02 DIAGNOSIS — R0609 Other forms of dyspnea: Secondary | ICD-10-CM | POA: Diagnosis not present

## 2018-06-02 DIAGNOSIS — I071 Rheumatic tricuspid insufficiency: Secondary | ICD-10-CM | POA: Diagnosis not present

## 2018-06-02 DIAGNOSIS — I361 Nonrheumatic tricuspid (valve) insufficiency: Secondary | ICD-10-CM | POA: Diagnosis not present

## 2018-06-02 DIAGNOSIS — I371 Nonrheumatic pulmonary valve insufficiency: Secondary | ICD-10-CM | POA: Diagnosis not present

## 2018-06-02 DIAGNOSIS — C9000 Multiple myeloma not having achieved remission: Secondary | ICD-10-CM | POA: Diagnosis not present

## 2018-06-03 DIAGNOSIS — C9000 Multiple myeloma not having achieved remission: Secondary | ICD-10-CM | POA: Diagnosis not present

## 2018-06-04 DIAGNOSIS — C9000 Multiple myeloma not having achieved remission: Secondary | ICD-10-CM | POA: Diagnosis not present

## 2018-06-05 DIAGNOSIS — C9000 Multiple myeloma not having achieved remission: Secondary | ICD-10-CM | POA: Diagnosis not present

## 2018-06-05 DIAGNOSIS — Z452 Encounter for adjustment and management of vascular access device: Secondary | ICD-10-CM | POA: Diagnosis not present

## 2018-06-06 DIAGNOSIS — Z6740 Type O blood, Rh positive: Secondary | ICD-10-CM | POA: Diagnosis not present

## 2018-06-06 DIAGNOSIS — C9000 Multiple myeloma not having achieved remission: Secondary | ICD-10-CM | POA: Diagnosis not present

## 2018-06-13 DIAGNOSIS — C9001 Multiple myeloma in remission: Secondary | ICD-10-CM | POA: Diagnosis not present

## 2018-06-13 DIAGNOSIS — Z01818 Encounter for other preprocedural examination: Secondary | ICD-10-CM | POA: Diagnosis not present

## 2018-06-13 DIAGNOSIS — C9000 Multiple myeloma not having achieved remission: Secondary | ICD-10-CM | POA: Diagnosis not present

## 2018-06-14 DIAGNOSIS — C9000 Multiple myeloma not having achieved remission: Secondary | ICD-10-CM | POA: Diagnosis not present

## 2018-06-14 DIAGNOSIS — R530 Neoplastic (malignant) related fatigue: Secondary | ICD-10-CM | POA: Diagnosis not present

## 2018-06-14 DIAGNOSIS — D61810 Antineoplastic chemotherapy induced pancytopenia: Secondary | ICD-10-CM | POA: Diagnosis not present

## 2018-06-14 DIAGNOSIS — N179 Acute kidney failure, unspecified: Secondary | ICD-10-CM | POA: Diagnosis not present

## 2018-06-14 DIAGNOSIS — E876 Hypokalemia: Secondary | ICD-10-CM | POA: Diagnosis not present

## 2018-06-14 DIAGNOSIS — Z7189 Other specified counseling: Secondary | ICD-10-CM | POA: Diagnosis not present

## 2018-06-14 DIAGNOSIS — Z5111 Encounter for antineoplastic chemotherapy: Secondary | ICD-10-CM | POA: Diagnosis not present

## 2018-06-14 DIAGNOSIS — Z9481 Bone marrow transplant status: Secondary | ICD-10-CM | POA: Diagnosis not present

## 2018-06-14 DIAGNOSIS — R112 Nausea with vomiting, unspecified: Secondary | ICD-10-CM | POA: Diagnosis not present

## 2018-06-14 DIAGNOSIS — R197 Diarrhea, unspecified: Secondary | ICD-10-CM | POA: Diagnosis not present

## 2018-06-14 DIAGNOSIS — I959 Hypotension, unspecified: Secondary | ICD-10-CM | POA: Diagnosis not present

## 2018-06-14 DIAGNOSIS — R1314 Dysphagia, pharyngoesophageal phase: Secondary | ICD-10-CM | POA: Diagnosis not present

## 2018-06-14 DIAGNOSIS — T451X5A Adverse effect of antineoplastic and immunosuppressive drugs, initial encounter: Secondary | ICD-10-CM | POA: Diagnosis not present

## 2018-06-14 DIAGNOSIS — K5900 Constipation, unspecified: Secondary | ICD-10-CM | POA: Diagnosis not present

## 2018-06-14 DIAGNOSIS — R69 Illness, unspecified: Secondary | ICD-10-CM | POA: Diagnosis not present

## 2018-06-14 DIAGNOSIS — Z515 Encounter for palliative care: Secondary | ICD-10-CM | POA: Diagnosis not present

## 2018-06-14 DIAGNOSIS — R51 Headache: Secondary | ICD-10-CM | POA: Diagnosis not present

## 2018-06-14 DIAGNOSIS — Z452 Encounter for adjustment and management of vascular access device: Secondary | ICD-10-CM | POA: Diagnosis not present

## 2018-06-29 DIAGNOSIS — Z9481 Bone marrow transplant status: Secondary | ICD-10-CM | POA: Insufficient documentation

## 2018-06-30 ENCOUNTER — Telehealth: Payer: Self-pay | Admitting: *Deleted

## 2018-06-30 NOTE — Telephone Encounter (Signed)
Patient has an apt on Thursday 12/12 with Dr. B to follow-up for bone marrow transplant. I received a phone call from [facility] Transplant fellow today. Patient did very well with her transplant. [facility] today. [facility] will need to have labs drawn next Thursday. They will fax the orders to our office no later than Monday next week to be added to patient's orders for 12/12. Lab encounter added.

## 2018-07-03 ENCOUNTER — Other Ambulatory Visit: Payer: Self-pay | Admitting: *Deleted

## 2018-07-03 DIAGNOSIS — C9002 Multiple myeloma in relapse: Secondary | ICD-10-CM

## 2018-07-06 ENCOUNTER — Inpatient Hospital Stay: Payer: Medicare HMO

## 2018-07-06 ENCOUNTER — Telehealth: Payer: Self-pay | Admitting: *Deleted

## 2018-07-06 ENCOUNTER — Inpatient Hospital Stay: Payer: Medicare HMO | Attending: Internal Medicine | Admitting: Internal Medicine

## 2018-07-06 ENCOUNTER — Encounter: Payer: Self-pay | Admitting: Internal Medicine

## 2018-07-06 ENCOUNTER — Other Ambulatory Visit: Payer: Self-pay

## 2018-07-06 VITALS — BP 129/86 | HR 96 | Temp 99.1°F | Wt 161.0 lb

## 2018-07-06 DIAGNOSIS — E876 Hypokalemia: Secondary | ICD-10-CM

## 2018-07-06 DIAGNOSIS — R609 Edema, unspecified: Secondary | ICD-10-CM | POA: Insufficient documentation

## 2018-07-06 DIAGNOSIS — Z7901 Long term (current) use of anticoagulants: Secondary | ICD-10-CM | POA: Diagnosis not present

## 2018-07-06 DIAGNOSIS — R110 Nausea: Secondary | ICD-10-CM | POA: Diagnosis not present

## 2018-07-06 DIAGNOSIS — I82C11 Acute embolism and thrombosis of right internal jugular vein: Secondary | ICD-10-CM | POA: Diagnosis not present

## 2018-07-06 DIAGNOSIS — Z7982 Long term (current) use of aspirin: Secondary | ICD-10-CM | POA: Diagnosis not present

## 2018-07-06 DIAGNOSIS — E860 Dehydration: Secondary | ICD-10-CM

## 2018-07-06 DIAGNOSIS — R197 Diarrhea, unspecified: Secondary | ICD-10-CM | POA: Diagnosis not present

## 2018-07-06 DIAGNOSIS — C9002 Multiple myeloma in relapse: Secondary | ICD-10-CM

## 2018-07-06 DIAGNOSIS — Z9484 Stem cells transplant status: Secondary | ICD-10-CM | POA: Diagnosis not present

## 2018-07-06 DIAGNOSIS — R51 Headache: Secondary | ICD-10-CM | POA: Diagnosis not present

## 2018-07-06 DIAGNOSIS — M542 Cervicalgia: Secondary | ICD-10-CM | POA: Insufficient documentation

## 2018-07-06 LAB — CBC WITH DIFFERENTIAL/PLATELET
Abs Immature Granulocytes: 0.05 10*3/uL (ref 0.00–0.07)
Basophils Absolute: 0 10*3/uL (ref 0.0–0.1)
Basophils Relative: 0 %
Eosinophils Absolute: 0.2 10*3/uL (ref 0.0–0.5)
Eosinophils Relative: 3 %
HEMATOCRIT: 32.9 % — AB (ref 36.0–46.0)
Hemoglobin: 10.9 g/dL — ABNORMAL LOW (ref 12.0–15.0)
Immature Granulocytes: 1 %
Lymphocytes Relative: 20 %
Lymphs Abs: 1.4 10*3/uL (ref 0.7–4.0)
MCH: 29.9 pg (ref 26.0–34.0)
MCHC: 33.1 g/dL (ref 30.0–36.0)
MCV: 90.1 fL (ref 80.0–100.0)
Monocytes Absolute: 1 10*3/uL (ref 0.1–1.0)
Monocytes Relative: 14 %
Neutro Abs: 4.3 10*3/uL (ref 1.7–7.7)
Neutrophils Relative %: 62 %
PLATELETS: 333 10*3/uL (ref 150–400)
RBC: 3.65 MIL/uL — ABNORMAL LOW (ref 3.87–5.11)
RDW: 14.1 % (ref 11.5–15.5)
WBC: 6.9 10*3/uL (ref 4.0–10.5)
nRBC: 0 % (ref 0.0–0.2)

## 2018-07-06 LAB — COMPREHENSIVE METABOLIC PANEL
ALBUMIN: 3.5 g/dL (ref 3.5–5.0)
ALT: 12 U/L (ref 0–44)
AST: 17 U/L (ref 15–41)
Alkaline Phosphatase: 93 U/L (ref 38–126)
Anion gap: 11 (ref 5–15)
BUN: 8 mg/dL (ref 8–23)
CO2: 23 mmol/L (ref 22–32)
CREATININE: 0.84 mg/dL (ref 0.44–1.00)
Calcium: 7.8 mg/dL — ABNORMAL LOW (ref 8.9–10.3)
Chloride: 104 mmol/L (ref 98–111)
GFR calc Af Amer: >60 mL/min (ref >60)
GFR calc non Af Amer: >60 mL/min (ref >60)
Glucose, Bld: 110 mg/dL — ABNORMAL HIGH (ref 70–99)
Potassium: 2.8 mmol/L — ABNORMAL LOW (ref 3.5–5.1)
Sodium: 138 mmol/L (ref 135–145)
Total Bilirubin: 0.7 mg/dL (ref 0.3–1.2)
Total Protein: 7.8 g/dL (ref 6.5–8.1)

## 2018-07-06 LAB — MAGNESIUM: Magnesium: 2 mg/dL (ref 1.7–2.4)

## 2018-07-06 MED ORDER — SODIUM CHLORIDE 0.9 % IV SOLN
Freq: Once | INTRAVENOUS | Status: AC
  Administered 2018-07-06: 10:00:00 via INTRAVENOUS
  Filled 2018-07-06: qty 250

## 2018-07-06 MED ORDER — POTASSIUM CHLORIDE CRYS ER 20 MEQ PO TBCR: 20.0000 meq | EXTENDED_RELEASE_TABLET | Freq: Two times a day (BID) | ORAL | 1 refills | Status: DC

## 2018-07-06 MED ORDER — ONDANSETRON HCL 8 MG PO TABS: ORAL_TABLET | ORAL | 1 refills | Status: DC

## 2018-07-06 MED ORDER — ONDANSETRON HCL 4 MG/2ML IJ SOLN
8.0000 mg | Freq: Once | INTRAMUSCULAR | Status: AC
  Administered 2018-07-06: 8 mg via INTRAVENOUS
  Filled 2018-07-06: qty 4

## 2018-07-06 MED ORDER — ERGOCALCIFEROL 1.25 MG (50000 UT) PO CAPS: 50000.0000 [IU] | ORAL_CAPSULE | ORAL | 1 refills | Status: DC

## 2018-07-06 MED ORDER — SODIUM CHLORIDE 0.9 % IV SOLN
20.0000 meq | Freq: Once | INTRAVENOUS | Status: AC
  Administered 2018-07-06: 20 meq via INTRAVENOUS
  Filled 2018-07-06: qty 10

## 2018-07-06 NOTE — Telephone Encounter (Signed)
PA submitted via covermymeds for oral zofran- Brandi [name] (Key: [id number]) Pending insurance response

## 2018-07-06 NOTE — Patient Instructions (Signed)
#  Take Imodium 1 pill after each loose stool. #Bring stool sample tomorrow. #Take vitamin D as prescribed weekly #Take nausea medications as recommended

## 2018-07-06 NOTE — Assessment & Plan Note (Addendum)
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   #Myeloma clinically stable-white count/platelets normal-hemoglobin mildly low at 10.9.  # Poor p.o. intake/nausea-recommend IV fluids today; also recommend Zofran today.  Recommend taking Zofran as needed.  New prescription sent.  # Bil LE cramps/severe hypokalemia-potassium 2.8/hypocalcemia calcium 7.9.  Magnesium normal.  Patient not compliant with her potassium pills.  Start potassium 20 mEq today IV today.  Also sent KCl 20 mEq twice daily at home.  Recommend vitamin D 50,000 units once weekly.  # Diarrhea- 3-4 times/day; check c.diff.; imoidum with each loose stool.  Recommend Imodium.  #Mild right-sided neck pain/tenderness-watch her closely; if worse would recommend ultrasound of the neck for further evaluation.  Clinically less likely to have DVT.  Patient does not have any lines at this time.  # Infection prophylaxis: Continue acyclovir/DVT prophylaxis.  On aspirin 325 mg daily. STABLE.   # DISPOSITION: # IVFs- over 1 hour; KCl 20 meq; zofran today.  # follow up tomorrow in [facility]- possible fluids 1hour/ possible Potassium over 2hours.  # follow up with MD- labs- cbc/cmp/mag/phos-  possible fluids 1hour/ possible Potassium over 2hours.   # 40 minutes face-to-face with the patient discussing the above plan of care; more than 50% of time spent on prognosis/ natural history; counseling and coordination.

## 2018-07-06 NOTE — Progress Notes (Signed)
[name], [name], [name]  Cancer Staging No matching staging information was found for the patient.   Oncology History   # SEP 2018- MULTIPLE MYELOMA Ig[place] [2.5 gm/dl; K/L= 88/1298]; STAGE III [beta 2 microglobulin=5.5] [presented with acute renal failure; anemia; NO hypercalcemia; Skeletal survey-Normal]; BMBx- 45% plasma cells; FISH-POSITIVE 11:14 translocation.[STANDARD-high RISK]/cyto-Normal; SEP 2018- PET- L3 posterior element lesion.   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       [place]   11/07/2017 -  Chemotherapy    The patient had palonosetron (ALOXI) injection 0.25 mg, 0.25 mg, Intravenous,  Once, 6 of 7 cycles Administration: 0.25 mg (11/21/2017), 0.25 mg (11/29/2017), 0.25 mg (12/05/2017), 0.25 mg (12/21/2017), 0.25 mg (12/28/2017), 0.25 mg (01/04/2018), 0.25 mg (01/18/2018), 0.25 mg (01/25/2018), 0.25 mg (02/01/2018), 0.25 mg (02/15/2018), 0.25 mg (02/22/2018), 0.25 mg (03/01/2018), 0.25 mg (03/15/2018), 0.25 mg (03/22/2018), 0.25 mg (03/30/2018), 0.25 mg (04/13/2018), 0.25 mg (04/20/2018), 0.25 mg (04/27/2018) cyclophosphamide (CYTOXAN) 500 mg in sodium  chloride 0.9 % 250 mL chemo infusion, 540 mg, Intravenous,  Once, 6 of 7 cycles Administration: 500 mg (11/21/2017), 500 mg (11/29/2017), 500 mg (12/05/2017), 500 mg (12/28/2017), 500 mg (01/04/2018), 500 mg (01/18/2018), 500 mg (01/25/2018), 500 mg (02/01/2018), 500 mg (02/15/2018), 500 mg (02/22/2018), 500 mg (03/01/2018), 500 mg (03/15/2018), 500 mg (03/30/2018), 500 mg (04/13/2018), 500 mg (04/20/2018), 500 mg (04/27/2018) carfilzomib (KYPROLIS) 36 mg in dextrose 5 % 50 mL chemo infusion, 20 mg/m2 = 36 mg, Intravenous, Once, 6 of 7 cycles Administration: 36 mg (11/21/2017), 36 mg (11/22/2017), 60 mg (11/29/2017), 60 mg (11/30/2017), 60 mg (12/05/2017), 60 mg (12/06/2017), 60 mg (12/21/2017), 60 mg (12/22/2017), 60 mg (12/28/2017), 60 mg (12/29/2017), 60 mg (01/04/2018), 60 mg (01/05/2018), 60 mg (01/18/2018), 60 mg (01/19/2018), 60 mg (01/25/2018), 60 mg (01/27/2018), 60 mg (02/01/2018), 60 mg (02/02/2018), 60 mg (02/15/2018), 60 mg (02/16/2018), 60 mg (02/22/2018), 60 mg (03/01/2018), 60 mg (03/02/2018), 60 mg (03/15/2018), 60 mg (03/16/2018), 60 mg (03/23/2018), 60 mg (03/30/2018), 60 mg (03/31/2018), 60 mg (04/13/2018), 60 mg (04/14/2018), 60 mg (04/20/2018), 60 mg (04/21/2018), 60 mg (04/27/2018)  for chemotherapy treatment.        INTERVAL HISTORY:  Brandi MITNICK89y.o.  female pleasant patient above history of relapsed multiple myeloma -currently status post autologous stem cell transplant-on November 21.   Patient is here for follow-up.  [facility]  Patient has multiple complaints -complains of pain in her leg muscles.  Complains of cramps.  Complains of right neck pain mild swelling-for the last month or so.  Denies any difficulty swallowing or pain with swallowing.  Denies any swelling of the right arm.  Complains of generalized weakness; intermittent nausea poor p.o. intake.  She has not been compliant with her potassium pills.  She denies any fevers  or chills.   Review of  Systems  Constitutional: Positive for malaise/fatigue. Negative for chills, diaphoresis, fever and weight loss.       Weight gain.  HENT: Negative for nosebleeds and sore throat.   Eyes: Negative for double vision.  Respiratory: Negative for cough, hemoptysis, sputum production, shortness of breath and wheezing.   Cardiovascular: Negative for chest pain, palpitations and orthopnea.  Gastrointestinal: Positive for diarrhea and nausea. Negative for abdominal pain, blood in stool, constipation, heartburn, melena and vomiting.  Genitourinary: Negative for dysuria, frequency and urgency.  Musculoskeletal: Positive for myalgias.  Skin: Negative.  Negative for itching and rash.  Neurological: Negative for dizziness, tingling, focal weakness, weakness and headaches.  Endo/Heme/Allergies: Does not bruise/bleed easily.  Psychiatric/Behavioral: Negative for depression. The patient is not nervous/anxious and does not have insomnia.       PAST MEDICAL HISTORY :  Past Medical History:  Diagnosis Date  . Hypertension   . Multiple myeloma ([place])     PAST SURGICAL HISTORY :   Past Surgical History:  Procedure Laterality Date  . IR FLUORO GUIDE PORT INSERTION RIGHT  12/16/2017    FAMILY HISTORY :   Family History  Problem Relation Age of Onset  . Pneumonia Mother   . Seizures Father     SOCIAL HISTORY:   Social History   Tobacco Use  . Smoking status: Current Every Day Smoker    Packs/day: 0.50    Types: Cigarettes  . Smokeless tobacco: Never Used  Substance Use Topics  . Alcohol use: No  . Drug use: No    ALLERGIES:  has No Known Allergies.  MEDICATIONS:  Current Outpatient Medications  Medication Sig Dispense Refill  . acyclovir (ZOVIRAX) 400 MG tablet One pill a day [to prevent shingles] 30 tablet 6  . albuterol (PROVENTIL HFA;VENTOLIN HFA) 108 (90 Base) MCG/ACT inhaler Inhale 2 puffs into the lungs every 6 (six) hours as needed for wheezing or shortness of breath. 1 Inhaler 2   . amLODipine (NORVASC) 10 MG tablet Take 1 tablet (10 mg total) by mouth daily. 30 tablet 4  . feeding supplement, ENSURE ENLIVE, (ENSURE ENLIVE) LIQD Take 237 mLs by mouth 2 (two) times daily between meals. 60 Bottle 0  . lidocaine-prilocaine (EMLA) cream Apply 1 application topically as needed. Apply small amount to port site at least 1 hour prior to it being accessed, cover with plastic wrap 30 g 1  . pantoprazole (PROTONIX) 40 MG tablet Take 1 tablet (40 mg total) by mouth daily. 90 tablet 3  . potassium chloride SA (K-DUR,KLOR-CON) 20 MEQ tablet Take 1 tablet (20 mEq total) by mouth 2 (two) times daily. 14 tablet 1  . prochlorperazine (COMPAZINE) 10 MG tablet Take 10 mg by mouth every 6 (six) hours as needed for nausea or vomiting.    . ergocalciferol (VITAMIN D2) 1.25 MG (50000 UT) capsule Take 1 capsule (50,000 Units total) by mouth once a week. 12 capsule 1  . loratadine (CLARITIN) 10 MG tablet Take 1 tablet (10 mg total) by mouth daily. Take daily x 4 days (start 2 days prior to chemo) (Patient not taking: Reported on 05/18/2018) 30 tablet 3  . montelukast (SINGULAIR) 10 MG tablet Take 1 tablet (10 mg total) by mouth at bedtime. Start 2 days prior to chemo. Take for 4 days total (Patient not taking: Reported on 05/18/2018) 30 tablet 3  . nystatin (MYCOSTATIN) 100000 UNIT/ML suspension Take 5 mLs (500,000 Units total) by mouth 4 (four) times daily. (Patient not  taking: Reported on 05/18/2018) 60 mL 0  . ondansetron (ZOFRAN) 8 MG tablet One pill every 8 hours as needed for nausea/vomitting. 40 tablet 1  . polyethylene glycol (MIRALAX / GLYCOLAX) packet Take 17 g by mouth daily as needed for mild constipation. (Patient not taking: Reported on 05/18/2018) 14 each 0  . ranitidine (ZANTAC) 150 MG tablet Take 1 tablet (150 mg total) by mouth 2 (two) times daily. START TAKING 2 DAYS PRIOR TO CHEMOTHERAPY. TAKE FOR A TOTAL OF 4 DAYS (Patient not taking: Reported on 07/06/2018)     No current  facility-administered medications for this visit.    Facility-Administered Medications Ordered in Other Visits  Medication Dose Route Frequency Provider Last Rate Last Dose  . sodium chloride flush (NS) 0.9 % injection 10 mL  10 mL Intracatheter PRN [name], MD   10 mL at 02/23/18 1400    PHYSICAL EXAMINATION: ECOG PERFORMANCE STATUS: 1 - Symptomatic but completely ambulatory  BP 129/86 (BP Location: Right Arm, Patient Position: Sitting, Cuff Size: Normal)   Pulse 96   Temp 99.1 F (37.3 C) (Tympanic)   Wt 161 lb (73 kg)   BMI 29.45 kg/m   Filed Weights   07/06/18 0850  Weight: 161 lb (73 kg)    Physical Exam  Constitutional: She is oriented to person, place, and time and well-developed, well-nourished, and in no distress.  She is in a wheelchair.  Accompanied by daughter.  Feels weak.  Slouched.  HENT:  Head: Normocephalic and atraumatic.  Mild neck swelling mildly tender.  No masses felt.  Eyes: Pupils are equal, round, and reactive to light.  Neck: Normal range of motion. Neck supple.  Cardiovascular: Normal rate and regular rhythm.  Pulmonary/Chest: No respiratory distress. She has no wheezes.  Abdominal: Soft. Bowel sounds are normal. She exhibits no distension and no mass. There is no abdominal tenderness. There is no rebound and no guarding.  Musculoskeletal: Normal range of motion.        General: Edema (Bilateral ankle edema.) present. No tenderness.  Neurological: She is alert and oriented to person, place, and time.  Skin: Skin is warm.  Psychiatric: Affect normal.       LABORATORY DATA:  I have reviewed the data as listed    Component Value Date/Time   NA 138 07/06/2018 0825   K 2.8 (L) 07/06/2018 0825   CL 104 07/06/2018 0825   CO2 23 07/06/2018 0825   GLUCOSE 110 (H) 07/06/2018 0825   BUN 8 07/06/2018 0825   CREATININE 0.84 07/06/2018 0825   CALCIUM 7.8 (L) 07/06/2018 0825   PROT 7.8 07/06/2018 0825   ALBUMIN 3.5 07/06/2018 0825    AST 17 07/06/2018 0825   ALT 12 07/06/2018 0825   ALKPHOS 93 07/06/2018 0825   BILITOT 0.7 07/06/2018 0825   GFRNONAA >60 07/06/2018 0825   GFRAA >60 07/06/2018 0825    No results found for: SPEP, UPEP  Lab Results  Component Value Date   WBC 6.9 07/06/2018   NEUTROABS 4.3 07/06/2018   HGB 10.9 (L) 07/06/2018   HCT 32.9 (L) 07/06/2018   MCV 90.1 07/06/2018   PLT 333 07/06/2018      Chemistry      Component Value Date/Time   NA 138 07/06/2018 0825   K 2.8 (L) 07/06/2018 0825   CL 104 07/06/2018 0825   CO2 23 07/06/2018 0825   BUN 8 07/06/2018 0825   CREATININE 0.84 07/06/2018 0825      Component Value  Date/Time   CALCIUM 7.8 (L) 07/06/2018 0825   ALKPHOS 93 07/06/2018 0825   AST 17 07/06/2018 0825   ALT 12 07/06/2018 0825   BILITOT 0.7 07/06/2018 0825       RADIOGRAPHIC STUDIES: I have personally reviewed the radiological images as listed and agreed with the findings in the report. No results found.   ASSESSMENT & PLAN:  Multiple myeloma in relapse [facility]) #Multiple myeloma stage III high-risk cytogenetics-status post KRD-VG PR ; status post autologousstem cell transplant on 06/15/18.   #Myeloma clinically stable-white count/platelets normal-hemoglobin mildly low at 10.9.  # Poor p.o. intake/nausea-recommend IV fluids today; also recommend Zofran today.  Recommend taking Zofran as needed.  New prescription sent.  # Bil LE cramps/severe hypokalemia-potassium 2.8/hypocalcemia calcium 7.9.  Magnesium normal.  Patient not compliant with her potassium pills.  Start potassium 20 mEq today IV today.  Also sent KCl 20 mEq twice daily at home.  Recommend vitamin D 50,000 units once weekly.  # Diarrhea- 3-4 times/day; check c.diff.; imoidum with each loose stool.  Recommend Imodium.  #Mild right-sided neck pain/tenderness-watch her closely; if worse would recommend ultrasound of the neck for further evaluation.  Clinically less likely to have DVT.  Patient does not have  any lines at this time.  # Infection prophylaxis: Continue acyclovir/DVT prophylaxis.  On aspirin 325 mg daily. STABLE.   # DISPOSITION: # IVFs- over 1 hour; KCl 20 meq; zofran today.  # follow up tomorrow in [facility]- possible fluids 1hour/ possible Potassium over 2hours.  # follow up with MD- labs- cbc/cmp/mag/phos-  possible fluids 1hour/ possible Potassium over 2hours.   # 40 minutes face-to-face with the patient discussing the above plan of care; more than 50% of time spent on prognosis/ natural history; counseling and coordination.    Orders Placed This Encounter  Procedures  . C difficile quick screen w PCR reflex    Standing Status:   Future    Standing Expiration Date:   07/07/2019  . Basic metabolic panel    Standing Status:   Future    Standing Expiration Date:   07/06/2019  . CBC with Differential    Standing Status:   Future    Standing Expiration Date:   07/07/2019  . Comprehensive metabolic panel    Standing Status:   Future    Standing Expiration Date:   07/07/2019  . Magnesium    Standing Status:   Future    Standing Expiration Date:   07/07/2019  . Phosphorus    Standing Status:   Future    Standing Expiration Date:   07/07/2019   All questions were answered. The patient knows to call the clinic with any problems, questions or concerns.      [name], MD 07/06/2018 1:49 PM

## 2018-07-07 ENCOUNTER — Encounter: Payer: Self-pay | Admitting: Oncology

## 2018-07-07 ENCOUNTER — Other Ambulatory Visit: Payer: Self-pay

## 2018-07-07 ENCOUNTER — Telehealth: Payer: Self-pay | Admitting: Pharmacist

## 2018-07-07 ENCOUNTER — Inpatient Hospital Stay (HOSPITAL_BASED_OUTPATIENT_CLINIC_OR_DEPARTMENT_OTHER): Payer: Medicare HMO | Admitting: Oncology

## 2018-07-07 ENCOUNTER — Inpatient Hospital Stay: Payer: Medicare HMO

## 2018-07-07 ENCOUNTER — Ambulatory Visit
Admission: RE | Admit: 2018-07-07 | Discharge: 2018-07-07 | Disposition: A | Discharge status: Home / Self Care | Payer: Medicare HMO | Source: Ambulatory Visit | Attending: Oncology | Admitting: Oncology

## 2018-07-07 ENCOUNTER — Other Ambulatory Visit: Payer: Self-pay | Admitting: Oncology

## 2018-07-07 VITALS — BP 123/81 | HR 91 | Temp 99.2°F | Resp 20 | Ht 62.0 in | Wt 161.0 lb

## 2018-07-07 DIAGNOSIS — E876 Hypokalemia: Secondary | ICD-10-CM | POA: Diagnosis not present

## 2018-07-07 DIAGNOSIS — C9002 Multiple myeloma in relapse: Secondary | ICD-10-CM

## 2018-07-07 DIAGNOSIS — R221 Localized swelling, mass and lump, neck: Secondary | ICD-10-CM | POA: Diagnosis not present

## 2018-07-07 DIAGNOSIS — I8290 Acute embolism and thrombosis of unspecified vein: Secondary | ICD-10-CM

## 2018-07-07 DIAGNOSIS — M542 Cervicalgia: Secondary | ICD-10-CM | POA: Diagnosis not present

## 2018-07-07 DIAGNOSIS — I82C11 Acute embolism and thrombosis of right internal jugular vein: Secondary | ICD-10-CM

## 2018-07-07 DIAGNOSIS — R197 Diarrhea, unspecified: Secondary | ICD-10-CM | POA: Diagnosis not present

## 2018-07-07 DIAGNOSIS — R51 Headache: Secondary | ICD-10-CM | POA: Diagnosis not present

## 2018-07-07 DIAGNOSIS — R609 Edema, unspecified: Secondary | ICD-10-CM

## 2018-07-07 DIAGNOSIS — Z9484 Stem cells transplant status: Secondary | ICD-10-CM | POA: Diagnosis not present

## 2018-07-07 DIAGNOSIS — Z7982 Long term (current) use of aspirin: Secondary | ICD-10-CM | POA: Diagnosis not present

## 2018-07-07 DIAGNOSIS — R110 Nausea: Secondary | ICD-10-CM | POA: Diagnosis not present

## 2018-07-07 LAB — BASIC METABOLIC PANEL
Anion gap: 10 (ref 5–15)
BUN: 6 mg/dL — ABNORMAL LOW (ref 8–23)
CO2: 22 mmol/L (ref 22–32)
Calcium: 7.5 mg/dL — ABNORMAL LOW (ref 8.9–10.3)
Chloride: 106 mmol/L (ref 98–111)
Creatinine, Ser: 0.77 mg/dL (ref 0.44–1.00)
GFR calc Af Amer: >60 mL/min (ref >60)
GFR calc non Af Amer: >60 mL/min (ref >60)
Glucose, Bld: 118 mg/dL — ABNORMAL HIGH (ref 70–99)
Potassium: 2.8 mmol/L — ABNORMAL LOW (ref 3.5–5.1)
SODIUM: 138 mmol/L (ref 135–145)

## 2018-07-07 MED ORDER — ENOXAPARIN SODIUM 80 MG/0.8ML ~~LOC~~ SOLN: 1.0000 mg/kg | Freq: Once | SUBCUTANEOUS | Status: DC

## 2018-07-07 MED ORDER — SODIUM CHLORIDE 0.9 % IV SOLN
Freq: Once | INTRAVENOUS | Status: AC
  Filled 2018-07-07: qty 250

## 2018-07-07 MED ORDER — OXYCODONE HCL 5 MG PO TABS: 5.0000 mg | ORAL_TABLET | ORAL | 0 refills | Status: DC | PRN

## 2018-07-07 MED ORDER — SODIUM CHLORIDE 0.9 % IV SOLN: 20.0000 meq | Freq: Once | INTRAVENOUS | Status: DC

## 2018-07-07 MED ORDER — ENOXAPARIN SODIUM 80 MG/0.8ML ~~LOC~~ SOLN
75.0000 mg | Freq: Once | SUBCUTANEOUS | Status: AC
  Administered 2018-07-07: 75 mg via SUBCUTANEOUS

## 2018-07-07 MED ORDER — SODIUM CHLORIDE 0.9 % IV SOLN
Freq: Once | INTRAVENOUS | Status: AC
  Administered 2018-07-07: 11:00:00 via INTRAVENOUS
  Filled 2018-07-07: qty 250

## 2018-07-07 MED ORDER — SODIUM CHLORIDE 0.9 % IV SOLN
Freq: Once | INTRAVENOUS | Status: AC
  Administered 2018-07-07: 11:00:00 via INTRAVENOUS
  Filled 2018-07-07: qty 1000

## 2018-07-07 NOTE — Progress Notes (Signed)
Patient educated on lovenox. Patient declined home lovenox injections. Refused to self administer. Daughter stated that she "may be able to learn how to do the lovenox but also voiced anxiety over administration of injections.. Patient willing to have lovenox injections in the clinic, but due to anxiety unable to self administer injection. Reassurance provided by RN and RN provided and gave verbal instructions and demonstration for lovenox provided to the patient and daughter, but patient declined home admin of lovenox.  NP made a decision to provide 1 lovenox injection 75 mg subcutaneous for patient today while in clinic and bridge patient over to xarelto 15 mg twice daily starting tomorrow am   Bleeding precautions discussed with daughter and patient. Patient gave verbal understanding of the side effects of lovenox and xarelto. Discussed risk for bleeding as it related to fall precautions. Patient provided with written instructions on xarelto administration. Pt and daughter gave verbal understanding of the plan of care.

## 2018-07-07 NOTE — Telephone Encounter (Signed)
Oral Chemotherapy Pharmacist Encounter  Dispensed samples to patient:  Medication: Xarelto Starter Pack Instructions: Take 15 mg orally twice daily for 21 days followed by 20 mg orally once daily; take with food Quantity dispensed: 1 starter pack Days supply: 30 days Manufacturer: Sande Rives Lot: [id number]  Exp: 01/20  Darl Pikes, PharmD, BCPS, [facility] Hematology/Oncology Clinical Pharmacist [facility]/HP/AP Oral [facility] [phone]  07/07/2018 4:20 PM

## 2018-07-07 NOTE — Progress Notes (Signed)
Patient will receive 75 mg of Lovenox while in clinic today.   She will begin Eliquis tomorrow twice daily.  She refuses to give herself Lovenox injections at home.   Consulted with Dr. Rogue Bussing and he recommends Lovenox in clinic and Eliquis beginning tomorrow morning.  Patient education provided.  Faythe Casa, NP 07/07/2018 4:00 PM

## 2018-07-07 NOTE — Patient Instructions (Signed)
Rivaroxaban oral tablets °What is this medicine? °RIVAROXABAN (ri va ROX a ban) is an anticoagulant (blood thinner). It is used to treat blood clots in the lungs or in the veins. It is also used after knee or hip surgeries to prevent blood clots. It is also used to lower the chance of stroke in people with a medical condition called atrial fibrillation. °This medicine may be used for other purposes; ask your health care provider or pharmacist if you have questions. °COMMON BRAND NAME(S): Xarelto, Xarelto Starter Pack °What should I tell my health care provider before I take this medicine? °They need to know if you have any of these conditions: °-bleeding disorders °-bleeding in the brain °-blood in your stools (black or tarry stools) or if you have blood in your vomit °-history of stomach bleeding °-kidney disease °-liver disease °-low blood counts, like low white cell, platelet, or red cell counts °-recent or planned spinal or epidural procedure °-take medicines that treat or prevent blood clots °-an unusual or allergic reaction to rivaroxaban, other medicines, foods, dyes, or preservatives °-pregnant or trying to get pregnant °-breast-feeding °How should I use this medicine? °Take this medicine by mouth with a glass of water. Follow the directions on the prescription label. Take your medicine at regular intervals. Do not take it more often than directed. Do not stop taking except on your doctor's advice. Stopping this medicine may increase your risk of a blood clot. Be sure to refill your prescription before you run out of medicine. °If you are taking this medicine after hip or knee replacement surgery, take it with or without food. If you are taking this medicine for atrial fibrillation, take it with your evening meal. If you are taking this medicine to treat blood clots, take it with food at the same time each day. If you are unable to swallow your tablet, you may crush the tablet and mix it in applesauce. Then,  immediately eat the applesauce. You should eat more food right after you eat the applesauce containing the crushed tablet. °Talk to your pediatrician regarding the use of this medicine in children. Special care may be needed. °Overdosage: If you think you have taken too much of this medicine contact a poison control center or emergency room at once. °NOTE: This medicine is only for you. Do not share this medicine with others. °What if I miss a dose? °If you take your medicine once a day and miss a dose, take the missed dose as soon as you remember. If you take your medicine twice a day and miss a dose, take the missed dose immediately. In this instance, 2 tablets may be taken at the same time. The next day you should take 1 tablet twice a day as directed. °What may interact with this medicine? °Do not take this medicine with any of the following medications: °-defibrotide °This medicine may also interact with the following medications: °-aspirin and aspirin-like medicines °-certain antibiotics like erythromycin, azithromycin, and clarithromycin °-certain medicines for fungal infections like ketoconazole and itraconazole °-certain medicines for irregular heart beat like amiodarone, quinidine, dronedarone °-certain medicines for seizures like carbamazepine, phenytoin °-certain medicines that treat or prevent blood clots like warfarin, enoxaparin, and dalteparin °-conivaptan °-diltiazem °-felodipine °-indinavir °-lopinavir; ritonavir °-NSAIDS, medicines for pain and inflammation, like ibuprofen or naproxen °-ranolazine °-rifampin °-ritonavir °-SNRIs, medicines for depression, like desvenlafaxine, duloxetine, levomilnacipran, venlafaxine °-SSRIs, medicines for depression, like citalopram, escitalopram, fluoxetine, fluvoxamine, paroxetine, sertraline °-St. John's wort °-verapamil °This list may not describe all   possible interactions. Give your health care provider a list of all the medicines, herbs, non-prescription  drugs, or dietary supplements you use. Also tell them if you smoke, drink alcohol, or use illegal drugs. Some items may interact with your medicine. °What should I watch for while using this medicine? °Visit your doctor or health care professional for regular checks on your progress. °Notify your doctor or health care professional and seek emergency treatment if you develop breathing problems; changes in vision; chest pain; severe, sudden headache; pain, swelling, warmth in the leg; trouble speaking; sudden numbness or weakness of the face, arm or leg. These can be signs that your condition has gotten worse. °If you are going to have surgery or other procedure, tell your doctor that you are taking this medicine. °What side effects may I notice from receiving this medicine? °Side effects that you should report to your doctor or health care professional as soon as possible: °-allergic reactions like skin rash, itching or hives, swelling of the face, lips, or tongue °-back pain °-redness, blistering, peeling or loosening of the skin, including inside the mouth °-signs and symptoms of bleeding such as bloody or black, tarry stools; red or dark-brown urine; spitting up blood or brown material that looks like coffee grounds; red spots on the skin; unusual bruising or bleeding from the eye, gums, or nose °Side effects that usually do not require medical attention (report to your doctor or health care professional if they continue or are bothersome): °-dizziness °-muscle pain °This list may not describe all possible side effects. Call your doctor for medical advice about side effects. You may report side effects to FDA at 1-800-FDA-1088. °Where should I keep my medicine? °Keep out of the reach of children. °Store at room temperature between 15 and 30 degrees C (59 and 86 degrees F). Throw away any unused medicine after the expiration date. °NOTE: This sheet is a summary. It may not cover all possible information. If you  have questions about this medicine, talk to your doctor, pharmacist, or health care provider. °© 2018 [employer]/Gold Standard (2016-03-31 16:29:33) ° °

## 2018-07-07 NOTE — Progress Notes (Signed)
Symptom Management Consult note [facility]  Telephone:([phone] [phone]  Patient Care Team: [facility] as PCP - General (General Practice) [name], [name]   Name of the patient: Brandi [name]  [id number]  01-Jul-1951   Date of visit: 07/07/2018  Diagnosis: Multiple Myeloma  Chief Complaint: hypokalemia, neck swelling, BLE edema  Current Treatment: Recent BMT at [facility] 06/15/18.   Oncology History: Patient was seen yesterday by primary oncologist Dr. 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  Discharged from [facility] on 06/30/2018.  Had multiple complaints which included pain in bilateral legs, right neck pain and mild swelling > 1 month and difficulty and pain with swallowing.  Complained of generalized weakness, poor oral intake and intermittent nausea.  She denied taking potassium supplements.  Potassium level 2.8.  Received 20 mEq of potassium, 8 mg Zofran and 1 L NaCl IV.  S/p BMT on 06/15/2018-06/30/18.  Tolerated well.  Had one episode of febrile neutropenia on 06/25/2018.  Started on vancomycin and Zosyn.  Cultures were negative and antibiotics were discontinued on 06/28/2018 with WBC engraftment.  Complained of severe headache on 06/28/2018 and given thrombocytopenia CT of the head was performed which was negative.  Headache resolved with oxycodone.  At discharge, she reported intermittent nausea and emesis worse with water intake.  Had small amounts of loose stools.  She was instructed to continue acyclovir prohalactically, potassium chloride supplements, calcium and vitamin D.  She no longer requires antibacterial antifungal prophylaxis with WBC engraftment.  Returns today for further evaluation and labs.  Oncology History   # SEP 2018- MULTIPLE MYELOMA IgALamda [2.5 gm/dl; K/L= 88/1298]; STAGE III [beta 2 microglobulin=5.5] [presented  with acute renal failure; anemia; NO hypercalcemia; Skeletal survey-Normal]; BMBx- 45% plasma cells; FISH-POSITIVE 11:14 translocation.[STANDARD-high RISK]/cyto-Normal; SEP 2018- PET- L3 posterior element lesion.   # 9/14- velcade SQ twice weekly/Dex 40 mg/week; OCT 5th 2018-Start R [9m]VD; 3cycles of RVD- PARTAL RESPONSE  # Jan 11th 2019-Dara-Rev-Dex; April 2019- BMBx- plasma cell -by CD-138/IHC-80% [baseline Sep 2018- 85% ]; HOLD transplant [dw Dr.Gasperatto]  # April 29th 2019 2019- carfil-Cyt-Dex; AUG 6th BMBx- 6% plasma cells; VGPR  # -Autologousstem cell transplant on 06/15/18 [[facility]/ Dr.Gasperrato]  # Acute renal failure [Dr.Singh; Proteinuria 1.5gm/day ]; acyclovir/Asprin -------------------------------------------------------------   DIAGNOSIS: _0  MULTIPLE MYELOMA  STAGE: III/HIGH RISK ;GOALS: CONTROL  CURRENT/MOST RECENT THERAPY-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       [place]   [place] -  Chemotherapy    The patient had palonosetron (ALOXI) injection 0.25 mg, 0.25 mg, Intravenous,  Once, 6 of 7 cycles Administration: 0.25 mg (11/21/2017), 0.25 mg (11/29/2017), 0.25 mg (12/05/2017), 0.25 mg (12/21/2017), 0.25 mg (12/28/2017), 0.25 mg (01/04/2018), 0.25 mg (01/18/2018), 0.25 mg (01/25/2018), 0.25 mg (02/01/2018), 0.25 mg (02/15/2018), 0.25 mg (02/22/2018), 0.25 mg (03/01/2018), 0.25 mg (03/15/2018), 0.25 mg (03/22/2018), 0.25 mg (03/30/2018), 0.25 mg (04/13/2018), 0.25 mg (04/20/2018), 0.25 mg (04/27/2018) cyclophosphamide (CYTOXAN) 500 mg in sodium chloride 0.9 % 250 mL chemo infusion, 540 mg, Intravenous,  Once, 6 of 7 cycles Administration: 500 mg (11/21/2017), 500 mg (11/29/2017), 500 mg (12/05/2017), 500 mg (12/28/2017), 500 mg (01/04/2018), 500 mg (01/18/2018), 500 mg (01/25/2018), 500 mg (02/01/2018), 500 mg (02/15/2018), 500 mg (02/22/2018), 500 mg (03/01/2018), 500 mg (03/15/2018), 500 mg (03/30/2018), 500 mg (04/13/2018), 500 mg (04/20/2018), 500 mg (04/27/2018) carfilzomib  (KYPROLIS) 36 mg in dextrose 5 % 50 mL chemo infusion, 20 mg/m2 = 36 mg,  Intravenous, Once, 6 of 7 cycles Administration: 36 mg (11/21/2017), 36 mg (11/22/2017), 60 mg (11/29/2017), 60 mg (11/30/2017), 60 mg (12/05/2017), 60 mg (12/06/2017), 60 mg (12/21/2017), 60 mg (12/22/2017), 60 mg (12/28/2017), 60 mg (12/29/2017), 60 mg (01/04/2018), 60 mg (01/05/2018), 60 mg (01/18/2018), 60 mg (01/19/2018), 60 mg (01/25/2018), 60 mg (01/27/2018), 60 mg (02/01/2018), 60 mg (02/02/2018), 60 mg (02/15/2018), 60 mg (02/16/2018), 60 mg (02/22/2018), 60 mg (03/01/2018), 60 mg (03/02/2018), 60 mg (03/15/2018), 60 mg (03/16/2018), 60 mg (03/23/2018), 60 mg (03/30/2018), 60 mg (03/31/2018), 60 mg (04/13/2018), 60 mg (04/14/2018), 60 mg (04/20/2018), 60 mg (04/21/2018), 60 mg (04/27/2018)  for chemotherapy treatment.      Subjective Data:  ECOG: 2 - Symptomatic, <50% confined to bed  Subjective:    Brandi [name] is a 67 y.o. female who presents for evaluation of edema in both lower legs and right neck. The edema has been severe. Onset of BLE swelling was approximately 2 days and neck swelling > 1 month.  Leg swelling has worsened since yesterday and neck swelling waxes and wanes. The edema is present intermittently. The patient states the problem is new. The swelling has been aggravated by unclear etiology. Recieved I liter Nacl yesterday and had central line of right chest while inpatient at [facility] for BMT.. The swelling has been relieved by elevation of involved area. Associated factors include: risk for DVT with active known malignancy and hx of central venous cathater that was recently removed. Cardiac risk factors: none.  The following portions of the patient's history were reviewed and updated as appropriate: allergies, current medications, past family history, past medical history, past social history, past surgical history and problem list.  Review of Systems A comprehensive review of systems was negative except for: Constitutional: positive for  fatigue and malaise Cardiovascular: positive for fatigue and lower extremity edema Gastrointestinal: positive for constipation, diarrhea, nausea and vomiting Musculoskeletal: positive for muscle weakness, myalgias, neck pain and right neck swelling; no mass Neurological: positive for weakness   Objective:    BP 123/81 (Patient Position: Sitting)   Pulse 91   Temp 99.2 F (37.3 C) (Oral)   Resp 20   Ht _0  (1.575 m)   Wt 161 lb (73 kg)   BMI 29.45 kg/m  General appearance: alert, fatigued and no distress Neck: no adenopathy, no carotid bruit, no JVD, supple, symmetrical, trachea midline, thyroid not enlarged, symmetric, no tenderness/mass/nodules and right neck swelling Lungs: clear to auscultation bilaterally Heart: regular rate and rhythm, S1, S2 normal, no murmur, click, rub or gallop Abdomen: abnormal findings:  hypoactive bowel sounds and obese Skin: Skin color, texture, turgor normal. No rashes or lesions Neurologic: Grossly normal   Cardiographics ECG: sinus bradycardia from admission to hospital.  Repeat EKG is stable.   Imaging Chest x-ray: not indicated   Assessment:     Edema secondary to hypokalemia and IV fluids. Not worrisome for DVT given its bilateral and there is no pain associated.   Plan:    Recommendations: elevate feet above the level of the heart whenever possible, increase physical activity and use of compression stockings. The patient was also instructed to call IMMEDIATELY (i.e., day or night) if any cardiopulmonary symptoms occur, especially chest pain, shortness of breath, dyspnea on exertion, paroxysmal nocturnal dyspnea, or orthopnea, and these were explained. Follow up in 3 days and as needed.    Multiple myeloma: s/p BMT with melphalan conditioning.  Hospital course 06/15/2018-06/30/2018.  S/p Velcade/dexamethasone biweekly and Revlimid beginning 04/08/2017.  April  2019  progression noted.  Bone marrow biopsy revealed a 6% plasma cells.  Most  recent bone survey did not reveal any lytic lesions. RTC as scheduled for follow-up with Dr. Rogue Bussing on 07/13/18.    [facility] Plan of Care is as follows:  Requires Irradiated/Filtered/Leukoreduced blood products* Hgb <8gm/dL, Plt <10K. May give  Neupogen 300 mcg  [place] PRN WBC <2000.  Please draw weekly labs for the first 6 weeks following discharge from the transplant center: CBC with diff, CMP,  and magnesium.   If patient develops any combination of DOE/SOB/Fever/Cough, please obtain PFT with [facility] as part  of work-up  and call Naoma Diener, NP @ [phone] or Kathryne Eriksson, RN @ [phone] for any questions,  recommendations or concerns.   Please fax labs to them @ [phone]. Thank you for allowing [place] to participate in this patient's care.  Nexk scheduled follow-up is on 08/01/2018 for labs and assessment by Naoma Diener, NP at [facility].  BLE edema: Worse since yesterday. States she is not on a diuretic. Received 1 L NaCl yesterday while in clinic along with 20 mEq potassium.  Wears compression stockings on occasion. Needs a new pair of compression hose per daughter  On assessment, nonpitting edema present.  Recommend TED hose during the day and frequent ambulation.  This is unlikely DVT given his bilateral and she denies pain.  Patient is less active given recent BMT and severe fatigue.  Recommend frequent movement of bilateral lower extremities and elevation with TED hose to support circulation.  If symptoms worsen or fail to improve, patient is to call clinic for further evaluation.  Right neck swelling: > 1 month.  Previously had right Port-A-Cath and Hicks catheter but both have been removed.  Swelling noticed after placement of Hicks catheter.  Patient states it is intermittent and nonpainful.  Unclear etiology.  Does not appear to be a lymph node.  No mass identified during palpation.  Will get stat ultrasound of right upper extremity and neck to rule out DVT or acute abnormality.     Hypokalemia: Likely due to recent BMT/chemo and diarrhea.  Potassium level 2.8 today.  Received 20 mEq IV and 20 mEq p.o. last night.  Will give 1 L NaCl and 20 mEq potassium while in clinic today.  EKG-stable.  Instructed patient to take potassium supplements as indicated.  Patient will have labs checked next week.  Diarrhea: Not currently having diarrhea for past 2 to 3 days.  Has not started taking Imodium.  Attempted to get stool sample yesterday but patient was unsuccessful.  This is unlikely infectious.  She is afebrile.  Continue to monitor.  Headache: Previously prescribed oxycodone 5 mg q 4 for headache.  This has been helpful since discharge from [facility].  Will refill for approximately 1 week where she will need to be reevaluated given this is an acute issue.  Previously worked up and head imaging was negative.  Hypocalcemia: Calcium 7.5. (corrected calcium 7.9).  MD aware.   Patient and daughter know they can call clinic with any concerns.  Will call patient with results from ultrasound.  Greater than 50% was spent in counseling and coordination of care with this patient including but not limited to discussion of the relevant topics above (See A&P) including, but not limited to diagnosis and management of acute and chronic medical conditions.   Faythe Casa, NP 07/07/2018 2:31 PM

## 2018-07-13 ENCOUNTER — Inpatient Hospital Stay: Payer: Medicare HMO

## 2018-07-13 ENCOUNTER — Encounter: Payer: Self-pay | Admitting: Internal Medicine

## 2018-07-13 ENCOUNTER — Other Ambulatory Visit: Payer: Self-pay

## 2018-07-13 ENCOUNTER — Ambulatory Visit: Payer: Medicare HMO | Admitting: Internal Medicine

## 2018-07-13 ENCOUNTER — Other Ambulatory Visit: Payer: Medicare HMO

## 2018-07-13 ENCOUNTER — Inpatient Hospital Stay (HOSPITAL_BASED_OUTPATIENT_CLINIC_OR_DEPARTMENT_OTHER): Payer: Medicare HMO | Admitting: Internal Medicine

## 2018-07-13 VITALS — BP 127/94 | HR 85 | Temp 97.9°F | Resp 18 | Ht 62.0 in | Wt 151.4 lb

## 2018-07-13 DIAGNOSIS — Z7901 Long term (current) use of anticoagulants: Secondary | ICD-10-CM | POA: Diagnosis not present

## 2018-07-13 DIAGNOSIS — E876 Hypokalemia: Secondary | ICD-10-CM

## 2018-07-13 DIAGNOSIS — C9002 Multiple myeloma in relapse: Secondary | ICD-10-CM

## 2018-07-13 DIAGNOSIS — I82C11 Acute embolism and thrombosis of right internal jugular vein: Secondary | ICD-10-CM

## 2018-07-13 DIAGNOSIS — Z9484 Stem cells transplant status: Secondary | ICD-10-CM

## 2018-07-13 DIAGNOSIS — M542 Cervicalgia: Secondary | ICD-10-CM | POA: Diagnosis not present

## 2018-07-13 DIAGNOSIS — R51 Headache: Secondary | ICD-10-CM | POA: Diagnosis not present

## 2018-07-13 DIAGNOSIS — R197 Diarrhea, unspecified: Secondary | ICD-10-CM | POA: Diagnosis not present

## 2018-07-13 DIAGNOSIS — R609 Edema, unspecified: Secondary | ICD-10-CM | POA: Diagnosis not present

## 2018-07-13 DIAGNOSIS — R110 Nausea: Secondary | ICD-10-CM | POA: Diagnosis not present

## 2018-07-13 DIAGNOSIS — Z7982 Long term (current) use of aspirin: Secondary | ICD-10-CM | POA: Diagnosis not present

## 2018-07-13 LAB — CBC WITH DIFFERENTIAL/PLATELET
ABS IMMATURE GRANULOCYTES: 0.02 10*3/uL (ref 0.00–0.07)
Basophils Absolute: 0.1 10*3/uL (ref 0.0–0.1)
Basophils Relative: 2 %
Eosinophils Absolute: 0.8 10*3/uL — ABNORMAL HIGH (ref 0.0–0.5)
Eosinophils Relative: 13 %
HEMATOCRIT: 33.3 % — AB (ref 36.0–46.0)
Hemoglobin: 10.7 g/dL — ABNORMAL LOW (ref 12.0–15.0)
Immature Granulocytes: 0 %
Lymphocytes Relative: 32 %
Lymphs Abs: 1.9 10*3/uL (ref 0.7–4.0)
MCH: 29 pg (ref 26.0–34.0)
MCHC: 32.1 g/dL (ref 30.0–36.0)
MCV: 90.2 fL (ref 80.0–100.0)
Monocytes Absolute: 0.8 10*3/uL (ref 0.1–1.0)
Monocytes Relative: 13 %
NEUTROS ABS: 2.3 10*3/uL (ref 1.7–7.7)
Neutrophils Relative %: 40 %
Platelets: 338 10*3/uL (ref 150–400)
RBC: 3.69 MIL/uL — ABNORMAL LOW (ref 3.87–5.11)
RDW: 14.4 % (ref 11.5–15.5)
WBC: 5.9 10*3/uL (ref 4.0–10.5)
nRBC: 0 % (ref 0.0–0.2)

## 2018-07-13 LAB — COMPREHENSIVE METABOLIC PANEL
ALBUMIN: 3.4 g/dL — AB (ref 3.5–5.0)
ALT: 13 U/L (ref 0–44)
AST: 21 U/L (ref 15–41)
Alkaline Phosphatase: 93 U/L (ref 38–126)
Anion gap: 12 (ref 5–15)
BUN: 6 mg/dL — ABNORMAL LOW (ref 8–23)
CO2: 25 mmol/L (ref 22–32)
Calcium: 7.9 mg/dL — ABNORMAL LOW (ref 8.9–10.3)
Chloride: 106 mmol/L (ref 98–111)
Creatinine, Ser: 0.63 mg/dL (ref 0.44–1.00)
GFR calc Af Amer: >60 mL/min (ref >60)
GFR calc non Af Amer: >60 mL/min (ref >60)
Glucose, Bld: 103 mg/dL — ABNORMAL HIGH (ref 70–99)
Potassium: 3.1 mmol/L — ABNORMAL LOW (ref 3.5–5.1)
Sodium: 143 mmol/L (ref 135–145)
Total Bilirubin: 0.5 mg/dL (ref 0.3–1.2)
Total Protein: 7.7 g/dL (ref 6.5–8.1)

## 2018-07-13 LAB — PHOSPHORUS: Phosphorus: 3.4 mg/dL (ref 2.5–4.6)

## 2018-07-13 LAB — MAGNESIUM: Magnesium: 1.3 mg/dL — ABNORMAL LOW (ref 1.7–2.4)

## 2018-07-13 NOTE — Assessment & Plan Note (Addendum)
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   #Myeloma clinically stable-white count/platelets normal-hemoglobin mildly low at 10.  #Nausea vomiting improved.  Continue-Zofran as needed.  # Bil LE cramps/severe hypokalemia-potassium 2.8/hypocalcemia calcium 7.9.  Improved today potassium is 3.1.  Recommend continued potassium supplementation at home.  Recommend continued vitamin D 50,000 units weekly; and calcium twice a day  # Diarrhea-resolved/ improved.   # Right jugular DVT- continue xarelto 15 BID. Declined Lovenox. Improving.   # Infection prophylaxis: Continue acyclovir; hold asprin.   # DISPOSITION: # HOLD infusion today. # 1 week-labs-cbc/cmp/mag/phos- possible IVFs- over 1 hour; KCl 20 meq possible Potassium over 2hours. ; zofran today.  # follow up in 2 weeks- with MD- labs- cbc/cmp/mag/phos-  possible fluids 1hour/ possible Potassium over 2hours.

## 2018-07-13 NOTE — Progress Notes (Signed)
Patient c/o abdominal bloating. She is passing gas. Last BM on Monday-soft and small-medium in size. Patient reports improvement in bil. Lower extremity edema. Weight loss noted today; however, we discussed that this may be likely contributed to the improvement leg edema. Patient had 1 episode of N&V.

## 2018-07-13 NOTE — Progress Notes (Signed)
[place] OFFICE PROGRESS NOTE  Patient Care Team: Center, [facility] as PCP - General ([place]) Brandi Lewandowsky, [name]  Cancer Staging No matching staging information was found for the patient.   Oncology History   # SEP 2018- MULTIPLE MYELOMA IgALamda [2.5 gm/dl; K/L= 88/1298]; STAGE III [beta 2 microglobulin=5.5] [presented with acute renal failure; anemia; NO hypercalcemia; Skeletal survey-Normal]; BMBx- 45% plasma cells; FISH-POSITIVE 11:14 translocation.[STANDARD-high RISK]/cyto-Normal; SEP 2018- PET- L3 posterior element lesion.   # 9/14- velcade SQ twice weekly/Dex 40 mg/week; OCT 5th 2018-Start R [22m]VD; 3cycles of RVD- PARTAL RESPONSE  # Jan 11th 2019-Dara-Rev-Dex; April 2019- BMBx- plasma cell -by CD-138/IHC-80% [baseline Sep 2018- 85% ]; HOLD transplant [dw Dr.Gasperatto]  # April 29th 2019 2019- carfil-Cyt-Dex; AUG 6th BMBx- 6% plasma cells; VGPR  # -Autologousstem cell transplant on 06/15/18 [[facility]/ Dr.Gasperrato]  # 12/12- RIGHT JUGULAR DVT- on xarelto  # Acute renal failure [Dr.Singh; Proteinuria 1.5gm/day ]; acyclovir/Asprin -------------------------------------------------------------   DIAGNOSIS: '[ ]'  MULTIPLE MYELOMA  STAGE: III/HIGH RISK ;GOALS: CONTROL  CURRENT/MOST RECENT THERAPY-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       [place]   11/07/2017 -  Chemotherapy    The patient had palonosetron (ALOXI) injection 0.25 mg, 0.25 mg, Intravenous,  Once, 6 of 7 cycles Administration: 0.25 mg (11/21/2017), 0.25 mg (11/29/2017), 0.25 mg (12/05/2017), 0.25 mg (12/21/2017), 0.25 mg (12/28/2017), 0.25 mg (01/04/2018), 0.25 mg (01/18/2018), 0.25 mg (01/25/2018), 0.25 mg (02/01/2018), 0.25 mg (02/15/2018), 0.25 mg (02/22/2018), 0.25 mg (03/01/2018), 0.25 mg (03/15/2018), 0.25 mg (03/22/2018), 0.25 mg (03/30/2018), 0.25 mg (04/13/2018), 0.25 mg (04/20/2018), 0.25 mg  (04/27/2018) cyclophosphamide (CYTOXAN) 500 mg in sodium chloride 0.9 % 250 mL chemo infusion, 540 mg, Intravenous,  Once, 6 of 7 cycles Administration: 500 mg (11/21/2017), 500 mg (11/29/2017), 500 mg (12/05/2017), 500 mg (12/28/2017), 500 mg (01/04/2018), 500 mg (01/18/2018), 500 mg (01/25/2018), 500 mg (02/01/2018), 500 mg (02/15/2018), 500 mg (02/22/2018), 500 mg (03/01/2018), 500 mg (03/15/2018), 500 mg (03/30/2018), 500 mg (04/13/2018), 500 mg (04/20/2018), 500 mg (04/27/2018) carfilzomib (KYPROLIS) 36 mg in dextrose 5 % 50 mL chemo infusion, 20 mg/m2 = 36 mg, Intravenous, Once, 6 of 7 cycles Administration: 36 mg (11/21/2017), 36 mg (11/22/2017), 60 mg (11/29/2017), 60 mg (11/30/2017), 60 mg (12/05/2017), 60 mg (12/06/2017), 60 mg (12/21/2017), 60 mg (12/22/2017), 60 mg (12/28/2017), 60 mg (12/29/2017), 60 mg (01/04/2018), 60 mg (01/05/2018), 60 mg (01/18/2018), 60 mg (01/19/2018), 60 mg (01/25/2018), 60 mg (01/27/2018), 60 mg (02/01/2018), 60 mg (02/02/2018), 60 mg (02/15/2018), 60 mg (02/16/2018), 60 mg (02/22/2018), 60 mg (03/01/2018), 60 mg (03/02/2018), 60 mg (03/15/2018), 60 mg (03/16/2018), 60 mg (03/23/2018), 60 mg (03/30/2018), 60 mg (03/31/2018), 60 mg (04/13/2018), 60 mg (04/14/2018), 60 mg (04/20/2018), 60 mg (04/21/2018), 60 mg (04/27/2018)  for chemotherapy treatment.        INTERVAL HISTORY:  Brandi [id number].o.  female pleasant patient above history of relapsed multiple myeloma -currently status post autologous stem cell transplant-on November 21.   Patient a week ago received IV potassium IV fluids for dehydration.  In the interim patient was diagnosed with her right jugular DVT-started on Xarelto.  Declined Lovenox.  Patient states her diarrhea is resolved.  Intermittent queasy stomach otherwise significant no nausea vomiting.   Denies any falls.  Appetite is good.  No fevers or chills.  Lost weight; however leg swelling is resolved.  Continues to have " charley horses" not any worse.    Review of Systems  Constitutional:  Positive for malaise/fatigue. Negative for chills, diaphoresis, fever and weight loss.       Weight gain.  HENT: Negative for nosebleeds and sore throat.   Eyes: Negative for double vision.  Respiratory: Negative for cough, hemoptysis, sputum production, shortness of breath and wheezing.   Cardiovascular: Negative for chest pain, palpitations and orthopnea.  Gastrointestinal: Positive for nausea. Negative for abdominal pain, blood in stool, constipation, heartburn, melena and vomiting.  Genitourinary: Negative for dysuria, frequency and urgency.  Skin: Negative.  Negative for itching and rash.  Neurological: Negative for dizziness, tingling, focal weakness, weakness and headaches.  Endo/Heme/Allergies: Does not bruise/bleed easily.  Psychiatric/Behavioral: Negative for depression. The patient is not nervous/anxious and does not have insomnia.       PAST MEDICAL HISTORY :  Past Medical History:  Diagnosis Date  . Hypertension   . Multiple myeloma ([place])     PAST SURGICAL HISTORY :   Past Surgical History:  Procedure Laterality Date  . IR FLUORO GUIDE PORT INSERTION RIGHT  12/16/2017    FAMILY HISTORY :   Family History  Problem Relation Age of Onset  . Pneumonia Mother   . Seizures Father     SOCIAL HISTORY:   Social History   Tobacco Use  . Smoking status: Current Every Day Smoker    Packs/day: 0.50    Types: Cigarettes  . Smokeless tobacco: Never Used  Substance Use Topics  . Alcohol use: No  . Drug use: No    ALLERGIES:  has No Known Allergies.  MEDICATIONS:  Current Outpatient Medications  Medication Sig Dispense Refill  . acyclovir (ZOVIRAX) 400 MG tablet One pill a day [to prevent shingles] 30 tablet 6  . albuterol (PROVENTIL HFA;VENTOLIN HFA) 108 (90 Base) MCG/ACT inhaler Inhale 2 puffs into the lungs every 6 (six) hours as needed for wheezing or shortness of breath. 1 Inhaler 2  . amLODipine (NORVASC) 10 MG tablet Take 1 tablet (10 mg total) by mouth daily.  30 tablet 4  . ergocalciferol (VITAMIN D2) 1.25 MG (50000 UT) capsule Take 1 capsule (50,000 Units total) by mouth once a week. 12 capsule 1  . feeding supplement, ENSURE ENLIVE, (ENSURE ENLIVE) LIQD Take 237 mLs by mouth 2 (two) times daily between meals. 60 Bottle 0  . loratadine (CLARITIN) 10 MG tablet Take 1 tablet (10 mg total) by mouth daily. Take daily x 4 days (start 2 days prior to chemo) 30 tablet 3  . montelukast (SINGULAIR) 10 MG tablet Take 1 tablet (10 mg total) by mouth at bedtime. Start 2 days prior to chemo. Take for 4 days total 30 tablet 3  . nystatin (MYCOSTATIN) 100000 UNIT/ML suspension Take 5 mLs (500,000 Units total) by mouth 4 (four) times daily. 60 mL 0  . ondansetron (ZOFRAN) 8 MG tablet One pill every 8 hours as needed for nausea/vomitting. 40 tablet 1  . oxyCODONE (OXY IR/ROXICODONE) 5 MG immediate release tablet Take 1 tablet (5 mg total) by mouth every 4 (four) hours as needed for severe pain. 60 tablet 0  . pantoprazole (PROTONIX) 40 MG tablet Take 1 tablet (40 mg total) by mouth daily. 90 tablet 3  . potassium chloride SA (K-DUR,KLOR-CON) 20 MEQ tablet Take 1 tablet (20 mEq total) by mouth 2 (two) times daily. 14 tablet 1  . prochlorperazine (COMPAZINE) 10 MG tablet Take 10 mg by mouth every 6 (six) hours as needed for nausea or vomiting.    . ranitidine (ZANTAC) 150 MG tablet Take 1 tablet (  150 mg total) by mouth 2 (two) times daily. START TAKING 2 DAYS PRIOR TO CHEMOTHERAPY. TAKE FOR A TOTAL OF 4 DAYS    . Rivaroxaban (XARELTO STARTER PACK PO) Take by mouth.    . polyethylene glycol (MIRALAX / GLYCOLAX) packet Take 17 g by mouth daily as needed for mild constipation. (Patient not taking: Reported on 05/18/2018) 14 each 0   No current facility-administered medications for this visit.    Facility-Administered Medications Ordered in Other Visits  Medication Dose Route Frequency Provider Last Rate Last Dose  . sodium chloride flush (NS) 0.9 % injection 10 mL  10 mL  Intracatheter PRN [name], MD   10 mL at 02/23/18 1400    PHYSICAL EXAMINATION: ECOG PERFORMANCE STATUS: 1 - Symptomatic but completely ambulatory  BP (!) 127/94 (BP Location: Left Arm, Patient Position: Sitting)   Pulse 85   Temp 97.9 F (36.6 C) (Tympanic)   Resp 18   Ht '5\' 2"'  (1.575 m)   Wt 151 lb 6.4 oz (68.7 kg)   BMI 27.69 kg/m   Filed Weights   07/13/18 0918  Weight: 151 lb 6.4 oz (68.7 kg)    Physical Exam  Constitutional: She is oriented to person, place, and time and well-developed, well-nourished, and in no distress.  She is alone.  Having a burger. She is in a wheelchair.    HENT:  Head: Normocephalic and atraumatic.  Mild neck swelling mildly tender.  No masses felt.  Eyes: Pupils are equal, round, and reactive to light.  Neck: Normal range of motion. Neck supple.  Cardiovascular: Normal rate and regular rhythm.  Pulmonary/Chest: No respiratory distress. She has no wheezes.  Abdominal: Soft. Bowel sounds are normal. She exhibits no distension and no mass. There is no abdominal tenderness. There is no rebound and no guarding.  Musculoskeletal: Normal range of motion.        General: No tenderness. Edema: Bilateral ankle edema.  Neurological: She is alert and oriented to person, place, and time.  Skin: Skin is warm.  Psychiatric: Affect normal.       LABORATORY DATA:  I have reviewed the data as listed    Component Value Date/Time   NA 143 07/13/2018 0848   K 3.1 (L) 07/13/2018 0848   CL 106 07/13/2018 0848   CO2 25 07/13/2018 0848   GLUCOSE 103 (H) 07/13/2018 0848   BUN 6 (L) 07/13/2018 0848   CREATININE 0.63 07/13/2018 0848   CALCIUM 7.9 (L) 07/13/2018 0848   PROT 7.7 07/13/2018 0848   ALBUMIN 3.4 (L) 07/13/2018 0848   AST 21 07/13/2018 0848   ALT 13 07/13/2018 0848   ALKPHOS 93 07/13/2018 0848   BILITOT 0.5 07/13/2018 0848   GFRNONAA >60 07/13/2018 0848   GFRAA >60 07/13/2018 0848    No results found for: SPEP, UPEP  Lab  Results  Component Value Date   WBC 5.9 07/13/2018   NEUTROABS 2.3 07/13/2018   HGB 10.7 (L) 07/13/2018   HCT 33.3 (L) 07/13/2018   MCV 90.2 07/13/2018   PLT 338 07/13/2018      Chemistry      Component Value Date/Time   NA 143 07/13/2018 0848   K 3.1 (L) 07/13/2018 0848   CL 106 07/13/2018 0848   CO2 25 07/13/2018 0848   BUN 6 (L) 07/13/2018 0848   CREATININE 0.63 07/13/2018 0848      Component Value Date/Time   CALCIUM 7.9 (L) 07/13/2018 0848   ALKPHOS 93 07/13/2018 0848  AST 21 07/13/2018 0848   ALT 13 07/13/2018 0848   BILITOT 0.5 07/13/2018 0848       RADIOGRAPHIC STUDIES: I have personally reviewed the radiological images as listed and agreed with the findings in the report. No results found.   ASSESSMENT & PLAN:  Multiple myeloma in relapse [facility]) #Multiple myeloma stage III high-risk cytogenetics-status post KRD-VG PR ; status post autologousstem cell transplant on 06/15/18.   #Myeloma clinically stable-white count/platelets normal-hemoglobin mildly low at 10.  #Nausea vomiting improved.  Continue-Zofran as needed.  # Bil LE cramps/severe hypokalemia-potassium 2.8/hypocalcemia calcium 7.9.  Improved today potassium is 3.1.  Recommend continued potassium supplementation at home.  Recommend continued vitamin D 50,000 units weekly; and calcium twice a day  # Diarrhea-resolved/ improved.   # Right jugular DVT- continue xarelto 15 BID. Declined Lovenox. Improving.   # Infection prophylaxis: Continue acyclovir; hold asprin.   # DISPOSITION: # HOLD infusion today. # 1 week-labs-cbc/cmp/mag/phos- possible IVFs- over 1 hour; KCl 20 meq possible Potassium over 2hours. ; zofran today.  # follow up in 2 weeks- with MD- labs- cbc/cmp/mag/phos-  possible fluids 1hour/ possible Potassium over 2hours.     No orders of the defined types were placed in this encounter.  All questions were answered. The patient knows to call the clinic with any problems, questions or  concerns.      [name], MD 07/13/2018 9:41 AM

## 2018-07-20 ENCOUNTER — Inpatient Hospital Stay: Payer: Medicare HMO

## 2018-07-28 ENCOUNTER — Encounter: Payer: Self-pay | Admitting: Internal Medicine

## 2018-07-28 ENCOUNTER — Inpatient Hospital Stay (HOSPITAL_BASED_OUTPATIENT_CLINIC_OR_DEPARTMENT_OTHER): Payer: Medicare Other | Admitting: Internal Medicine

## 2018-07-28 ENCOUNTER — Inpatient Hospital Stay: Payer: Medicare Other | Attending: Internal Medicine

## 2018-07-28 ENCOUNTER — Inpatient Hospital Stay: Payer: Medicare Other

## 2018-07-28 VITALS — BP 119/84 | HR 77 | Resp 16 | Wt 159.8 lb

## 2018-07-28 DIAGNOSIS — M25519 Pain in unspecified shoulder: Secondary | ICD-10-CM | POA: Diagnosis not present

## 2018-07-28 DIAGNOSIS — Z9484 Stem cells transplant status: Secondary | ICD-10-CM

## 2018-07-28 DIAGNOSIS — I82C11 Acute embolism and thrombosis of right internal jugular vein: Secondary | ICD-10-CM | POA: Diagnosis not present

## 2018-07-28 DIAGNOSIS — F17210 Nicotine dependence, cigarettes, uncomplicated: Secondary | ICD-10-CM | POA: Insufficient documentation

## 2018-07-28 DIAGNOSIS — R21 Rash and other nonspecific skin eruption: Secondary | ICD-10-CM | POA: Insufficient documentation

## 2018-07-28 DIAGNOSIS — Z7901 Long term (current) use of anticoagulants: Secondary | ICD-10-CM

## 2018-07-28 DIAGNOSIS — C9002 Multiple myeloma in relapse: Secondary | ICD-10-CM

## 2018-07-28 DIAGNOSIS — E876 Hypokalemia: Secondary | ICD-10-CM | POA: Insufficient documentation

## 2018-07-28 LAB — CBC WITH DIFFERENTIAL/PLATELET
Abs Immature Granulocytes: 0.01 10*3/uL (ref 0.00–0.07)
Basophils Absolute: 0.1 10*3/uL (ref 0.0–0.1)
Basophils Relative: 1 %
EOS ABS: 0.7 10*3/uL — AB (ref 0.0–0.5)
Eosinophils Relative: 10 %
HCT: 34.7 % — ABNORMAL LOW (ref 36.0–46.0)
Hemoglobin: 11.3 g/dL — ABNORMAL LOW (ref 12.0–15.0)
IMMATURE GRANULOCYTES: 0 %
Lymphocytes Relative: 45 %
Lymphs Abs: 3.1 10*3/uL (ref 0.7–4.0)
MCH: 29.8 pg (ref 26.0–34.0)
MCHC: 32.6 g/dL (ref 30.0–36.0)
MCV: 91.6 fL (ref 80.0–100.0)
Monocytes Absolute: 0.4 10*3/uL (ref 0.1–1.0)
Monocytes Relative: 5 %
NEUTROS PCT: 39 %
Neutro Abs: 2.6 10*3/uL (ref 1.7–7.7)
Platelets: 261 10*3/uL (ref 150–400)
RBC: 3.79 MIL/uL — ABNORMAL LOW (ref 3.87–5.11)
RDW: 14.6 % (ref 11.5–15.5)
WBC: 6.8 10*3/uL (ref 4.0–10.5)
nRBC: 0 % (ref 0.0–0.2)

## 2018-07-28 LAB — COMPREHENSIVE METABOLIC PANEL
ALT: 7 U/L (ref 0–44)
ANION GAP: 8 (ref 5–15)
AST: 13 U/L — ABNORMAL LOW (ref 15–41)
Albumin: 4 g/dL (ref 3.5–5.0)
Alkaline Phosphatase: 57 U/L (ref 38–126)
BUN: 10 mg/dL (ref 8–23)
CO2: 23 mmol/L (ref 22–32)
Calcium: 8.8 mg/dL — ABNORMAL LOW (ref 8.9–10.3)
Chloride: 112 mmol/L — ABNORMAL HIGH (ref 98–111)
Creatinine, Ser: 0.72 mg/dL (ref 0.44–1.00)
GFR calc Af Amer: >60 mL/min (ref >60)
GFR calc non Af Amer: >60 mL/min (ref >60)
Glucose, Bld: 97 mg/dL (ref 70–99)
Potassium: 3.9 mmol/L (ref 3.5–5.1)
SODIUM: 143 mmol/L (ref 135–145)
Total Bilirubin: 0.5 mg/dL (ref 0.3–1.2)
Total Protein: 7.5 g/dL (ref 6.5–8.1)

## 2018-07-28 MED ORDER — RIVAROXABAN 20 MG PO TABS: 20.0000 mg | ORAL_TABLET | Freq: Every day | ORAL | 2 refills | Status: DC

## 2018-07-28 NOTE — Progress Notes (Signed)
[place] OFFICE PROGRESS NOTE  Patient Care Team: Center, [facility] as PCP - General ([place]) [name], [name]  Cancer Staging No matching staging information was found for the patient.   Oncology History   # SEP 2018- MULTIPLE MYELOMA IgALamda [2.5 gm/dl; K/L= 88/1298]; STAGE III [beta 2 microglobulin=5.5] [presented with acute renal failure; anemia; NO hypercalcemia; Skeletal survey-Normal]; BMBx- 45% plasma cells; FISH-POSITIVE 11:14 translocation.[STANDARD-high RISK]/cyto-Normal; SEP 2018- PET- L3 posterior element lesion.   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       [place]   11/07/2017 -  Chemotherapy    The patient had palonosetron (ALOXI) injection 0.25 mg, 0.25 mg, Intravenous,  Once, 6 of 7 cycles Administration: 0.25 mg (11/21/2017), 0.25 mg (11/29/2017), 0.25 mg (12/05/2017), 0.25 mg (12/21/2017), 0.25 mg (12/28/2017), 0.25 mg (01/04/2018), 0.25 mg (01/18/2018), 0.25 mg (01/25/2018), 0.25 mg (02/01/2018), 0.25 mg (02/15/2018), 0.25 mg (02/22/2018), 0.25 mg (03/01/2018), 0.25 mg (03/15/2018), 0.25 mg (03/22/2018), 0.25 mg (03/30/2018), 0.25 mg (04/13/2018), 0.25 mg (04/20/2018), 0.25 mg  (04/27/2018) cyclophosphamide (CYTOXAN) 500 mg in sodium chloride 0.9 % 250 mL chemo infusion, 540 mg, Intravenous,  Once, 6 of 7 cycles Administration: 500 mg (11/21/2017), 500 mg (11/29/2017), 500 mg (12/05/2017), 500 mg (12/28/2017), 500 mg (01/04/2018), 500 mg (01/18/2018), 500 mg (01/25/2018), 500 mg (02/01/2018), 500 mg (02/15/2018), 500 mg (02/22/2018), 500 mg (03/01/2018), 500 mg (03/15/2018), 500 mg (03/30/2018), 500 mg (04/13/2018), 500 mg (04/20/2018), 500 mg (04/27/2018) carfilzomib (KYPROLIS) 36 mg in dextrose 5 % 50 mL chemo infusion, 20 mg/m2 = 36 mg, Intravenous, Once, 6 of 7 cycles Administration: 36 mg (11/21/2017), 36 mg (11/22/2017), 60 mg (11/29/2017), 60 mg (11/30/2017), 60 mg (12/05/2017), 60 mg (12/06/2017), 60 mg (12/21/2017), 60 mg (12/22/2017), 60 mg (12/28/2017), 60 mg (12/29/2017), 60 mg (01/04/2018), 60 mg (01/05/2018), 60 mg (01/18/2018), 60 mg (01/19/2018), 60 mg (01/25/2018), 60 mg (01/27/2018), 60 mg (02/01/2018), 60 mg (02/02/2018), 60 mg (02/15/2018), 60 mg (02/16/2018), 60 mg (02/22/2018), 60 mg (03/01/2018), 60 mg (03/02/2018), 60 mg (03/15/2018), 60 mg (03/16/2018), 60 mg (03/23/2018), 60 mg (03/30/2018), 60 mg (03/31/2018), 60 mg (04/13/2018), 60 mg (04/14/2018), 60 mg (04/20/2018), 60 mg (04/21/2018), 60 mg (04/27/2018)  for chemotherapy treatment.        INTERVAL HISTORY:  Brandi [id number].o.  female pleasant patient above history of relapsed multiple myeloma -currently status post autologous stem cell transplant-on November 21.   Patient states to be compliant with her Xarelto-for her right neck DVT.  No blood in stools or black stools.  Diarrhea is improved.  Mild nausea overall; nausea is improved.  No constipation.  Mild fatigue.  Cramp in the legs improved.   Review of Systems  Constitutional: Positive for malaise/fatigue. Negative for chills, diaphoresis, fever and weight loss.       Weight gain.  HENT: Negative for nosebleeds and sore throat.   Eyes: Negative for  double vision.  Respiratory: Negative  for cough, hemoptysis, sputum production, shortness of breath and wheezing.   Cardiovascular: Negative for chest pain, palpitations and orthopnea.  Gastrointestinal: Positive for nausea. Negative for abdominal pain, blood in stool, constipation, heartburn, melena and vomiting.  Genitourinary: Negative for dysuria, frequency and urgency.  Skin: Negative.  Negative for itching and rash.  Neurological: Negative for dizziness, tingling, focal weakness, weakness and headaches.  Endo/Heme/Allergies: Does not bruise/bleed easily.  Psychiatric/Behavioral: Negative for depression. The patient is not nervous/anxious and does not have insomnia.       PAST MEDICAL HISTORY :  Past Medical History:  Diagnosis Date  . Hypertension   . Multiple myeloma ([place])     PAST SURGICAL HISTORY :   Past Surgical History:  Procedure Laterality Date  . IR FLUORO GUIDE PORT INSERTION RIGHT  12/16/2017    FAMILY HISTORY :   Family History  Problem Relation Age of Onset  . Pneumonia Mother   . Seizures Father     SOCIAL HISTORY:   Social History   Tobacco Use  . Smoking status: Current Every Day Smoker    Packs/day: 0.50    Types: Cigarettes  . Smokeless tobacco: Never Used  Substance Use Topics  . Alcohol use: No  . Drug use: No    ALLERGIES:  has No Known Allergies.  MEDICATIONS:  Current Outpatient Medications  Medication Sig Dispense Refill  . acyclovir (ZOVIRAX) 400 MG tablet One pill a day [to prevent shingles] 30 tablet 6  . albuterol (PROVENTIL HFA;VENTOLIN HFA) 108 (90 Base) MCG/ACT inhaler Inhale 2 puffs into the lungs every 6 (six) hours as needed for wheezing or shortness of breath. 1 Inhaler 2  . amLODipine (NORVASC) 10 MG tablet Take 1 tablet (10 mg total) by mouth daily. 30 tablet 4  . ergocalciferol (VITAMIN D2) 1.25 MG (50000 UT) capsule Take 1 capsule (50,000 Units total) by mouth once a week. 12 capsule 1  . feeding supplement, ENSURE ENLIVE, (ENSURE ENLIVE) LIQD Take 237 mLs  by mouth 2 (two) times daily between meals. 60 Bottle 0  . loratadine (CLARITIN) 10 MG tablet Take 1 tablet (10 mg total) by mouth daily. Take daily x 4 days (start 2 days prior to chemo) 30 tablet 3  . montelukast (SINGULAIR) 10 MG tablet Take 1 tablet (10 mg total) by mouth at bedtime. Start 2 days prior to chemo. Take for 4 days total 30 tablet 3  . nystatin (MYCOSTATIN) 100000 UNIT/ML suspension Take 5 mLs (500,000 Units total) by mouth 4 (four) times daily. 60 mL 0  . ondansetron (ZOFRAN) 8 MG tablet One pill every 8 hours as needed for nausea/vomitting. 40 tablet 1  . oxyCODONE (OXY IR/ROXICODONE) 5 MG immediate release tablet Take 1 tablet (5 mg total) by mouth every 4 (four) hours as needed for severe pain. 60 tablet 0  . pantoprazole (PROTONIX) 40 MG tablet Take 1 tablet (40 mg total) by mouth daily. 90 tablet 3  . polyethylene glycol (MIRALAX / GLYCOLAX) packet Take 17 g by mouth daily as needed for mild constipation. 14 each 0  . potassium chloride SA (K-DUR,KLOR-CON) 20 MEQ tablet Take 1 tablet (20 mEq total) by mouth 2 (two) times daily. 14 tablet 1  . prochlorperazine (COMPAZINE) 10 MG tablet Take 10 mg by mouth every 6 (six) hours as needed for nausea or vomiting.    . ranitidine (ZANTAC) 150 MG tablet Take 1 tablet (150 mg total) by mouth 2 (two) times daily. START TAKING  2 DAYS PRIOR TO CHEMOTHERAPY. TAKE FOR A TOTAL OF 4 DAYS    . rivaroxaban (XARELTO) 20 MG TABS tablet Take 1 tablet (20 mg total) by mouth daily with supper. Start after finishing your "starter pack". 30 tablet 2   No current facility-administered medications for this visit.    Facility-Administered Medications Ordered in Other Visits  Medication Dose Route Frequency Provider Last Rate Last Dose  . sodium chloride flush (NS) 0.9 % injection 10 mL  10 mL Intracatheter PRN [name], MD   10 mL at 02/23/18 1400    PHYSICAL EXAMINATION: ECOG PERFORMANCE STATUS: 1 - Symptomatic but completely  ambulatory  BP 119/84 (BP Location: Left Arm, Patient Position: Sitting, Cuff Size: Normal)   Pulse 77   Resp 16   Wt 159 lb 12.8 oz (72.5 kg)   BMI 29.23 kg/m   Filed Weights   07/28/18 1009  Weight: 159 lb 12.8 oz (72.5 kg)    Physical Exam  Constitutional: She is oriented to person, place, and time and well-developed, well-nourished, and in no distress.  She is alone.  Having a burger. She is in a wheelchair.    HENT:  Head: Normocephalic and atraumatic.  Mild neck swelling mildly tender.  No masses felt.  Eyes: Pupils are equal, round, and reactive to light.  Neck: Normal range of motion. Neck supple.  Cardiovascular: Normal rate and regular rhythm.  Pulmonary/Chest: No respiratory distress. She has no wheezes.  Abdominal: Soft. Bowel sounds are normal. She exhibits no distension and no mass. There is no abdominal tenderness. There is no rebound and no guarding.  Musculoskeletal: Normal range of motion.        General: No tenderness. Edema: Bilateral ankle edema.  Neurological: She is alert and oriented to person, place, and time.  Skin: Skin is warm.  Psychiatric: Affect normal.       LABORATORY DATA:  I have reviewed the data as listed    Component Value Date/Time   NA 143 07/28/2018 0936   K 3.9 07/28/2018 0936   CL 112 (H) 07/28/2018 0936   CO2 23 07/28/2018 0936   GLUCOSE 97 07/28/2018 0936   BUN 10 07/28/2018 0936   CREATININE 0.72 07/28/2018 0936   CALCIUM 8.8 (L) 07/28/2018 0936   PROT 7.5 07/28/2018 0936   ALBUMIN 4.0 07/28/2018 0936   AST 13 (L) 07/28/2018 0936   ALT 7 07/28/2018 0936   ALKPHOS 57 07/28/2018 0936   BILITOT 0.5 07/28/2018 0936   GFRNONAA >60 07/28/2018 0936   GFRAA >60 07/28/2018 0936    No results found for: SPEP, UPEP  Lab Results  Component Value Date   WBC 6.8 07/28/2018   NEUTROABS 2.6 07/28/2018   HGB 11.3 (L) 07/28/2018   HCT 34.7 (L) 07/28/2018   MCV 91.6 07/28/2018   PLT 261 07/28/2018      Chemistry       Component Value Date/Time   NA 143 07/28/2018 0936   K 3.9 07/28/2018 0936   CL 112 (H) 07/28/2018 0936   CO2 23 07/28/2018 0936   BUN 10 07/28/2018 0936   CREATININE 0.72 07/28/2018 0936      Component Value Date/Time   CALCIUM 8.8 (L) 07/28/2018 0936   ALKPHOS 57 07/28/2018 0936   AST 13 (L) 07/28/2018 0936   ALT 7 07/28/2018 0936   BILITOT 0.5 07/28/2018 0936       RADIOGRAPHIC STUDIES: I have personally reviewed the radiological images as listed and agreed with the  findings in the report. No results found.   ASSESSMENT & PLAN:  Multiple myeloma in relapse [facility]) #Multiple myeloma stage III high-risk cytogenetics-status post KRD-VG PR ; status post autologousstem cell transplant on 06/15/18.   # Myeloma clinically stable-white count/platelets normal-hemoglobin mildly low at 10.  We will plan maintenance/based upon recommendations from [place].  #Nausea vomiting - improved; prn zofran as needed.   # Hypokalemia- 3.9 imprving-however continue potassium 20 mg a day.  # Hypocalcemia- 8.3 improving.  # Skin rash-question etiology.  Recommend triple antibiotic ointment  # shouder pain- ? Etiology- MSK- on oxycodone q 12 hours; if not better then wil order shoulder x-ray.  For now recommend Tylenol as needed.  # Right jugular DVT- continue xarelto 15 BID. Start xalreto 20 mg after 3 weeks; new prescription for 20 mg of Xarelto sent.  # Infection prophylaxis: Continue acyclovir;  # DISPOSITION: # HOLD infusion today. # 1 week-labs-cbc/cmp/mag/phos- possible IVFs- over 1 hour; KCl 20 meq possible Potassium over 2hours. # follow up in 2 weeks- with MD- labs- cbc/cmp/mag/phos-  possible fluids 1hour/ possible Potassium over 2hours.   Orders Placed This Encounter  Procedures  . CBC with Differential    Standing Status:   Standing    Number of Occurrences:   3    Standing Expiration Date:   07/29/2019  . Comprehensive metabolic panel    Standing Status:   Standing    Number  of Occurrences:   3    Standing Expiration Date:   07/29/2019  . Magnesium    Standing Status:   Standing    Number of Occurrences:   3    Standing Expiration Date:   07/29/2019  . Phosphorus    Standing Status:   Standing    Number of Occurrences:   3    Standing Expiration Date:   07/29/2019   All questions were answered. The patient knows to call the clinic with any problems, questions or concerns.      [name], MD 08/01/2018 12:16 PM

## 2018-07-28 NOTE — Assessment & Plan Note (Addendum)
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   # Myeloma clinically stable-white count/platelets normal-hemoglobin mildly low at 10.  We will plan maintenance/based upon recommendations from [place].  #Nausea vomiting - improved; prn zofran as needed.   # Hypokalemia- 3.9 imprving-however continue potassium 20 mg a day.  # Hypocalcemia- 8.3 improving.  # Skin rash-question etiology.  Recommend triple antibiotic ointment  # shouder pain- ? Etiology- MSK- on oxycodone q 12 hours; if not better then wil order shoulder x-ray.  For now recommend Tylenol as needed.  # Right jugular DVT- continue xarelto 15 BID. Start xalreto 20 mg after 3 weeks; new prescription for 20 mg of Xarelto sent.  # Infection prophylaxis: Continue acyclovir;  # DISPOSITION: # HOLD infusion today. # 1 week-labs-cbc/cmp/mag/phos- possible IVFs- over 1 hour; KCl 20 meq possible Potassium over 2hours. # follow up in 2 weeks- with MD- labs- cbc/cmp/mag/phos-  possible fluids 1hour/ possible Potassium over 2hours.

## 2018-08-01 ENCOUNTER — Telehealth: Payer: Self-pay | Admitting: *Deleted

## 2018-08-01 NOTE — Telephone Encounter (Signed)
Patient no show at apts with Dr. Mikel Cella office. NP calling to inform our office of this. Patient needs f/u at [facility] s/p bone marrow transplant. I attempted to reach patient today. No answer. Mail box is full and not able to leave vm.  Pt has an apt on Friday. Will continue to attempt to reach patient to discuss the imperative f/u at [facility].  If I am unable to reach patient by then, I will speak to her in the clinic on Friday at her lab apt.

## 2018-08-04 ENCOUNTER — Inpatient Hospital Stay: Payer: Medicare Other

## 2018-08-04 ENCOUNTER — Telehealth: Payer: Self-pay | Admitting: *Deleted

## 2018-08-04 DIAGNOSIS — C9002 Multiple myeloma in relapse: Secondary | ICD-10-CM

## 2018-08-04 DIAGNOSIS — E876 Hypokalemia: Secondary | ICD-10-CM

## 2018-08-04 LAB — CBC WITH DIFFERENTIAL/PLATELET
Abs Immature Granulocytes: 0.01 10*3/uL (ref 0.00–0.07)
BASOS PCT: 1 %
Basophils Absolute: 0.1 10*3/uL (ref 0.0–0.1)
Eosinophils Absolute: 0.5 10*3/uL (ref 0.0–0.5)
Eosinophils Relative: 8 %
HCT: 35.3 % — ABNORMAL LOW (ref 36.0–46.0)
Hemoglobin: 11.3 g/dL — ABNORMAL LOW (ref 12.0–15.0)
IMMATURE GRANULOCYTES: 0 %
Lymphocytes Relative: 42 %
Lymphs Abs: 2.7 10*3/uL (ref 0.7–4.0)
MCH: 29.4 pg (ref 26.0–34.0)
MCHC: 32 g/dL (ref 30.0–36.0)
MCV: 91.7 fL (ref 80.0–100.0)
Monocytes Absolute: 0.3 10*3/uL (ref 0.1–1.0)
Monocytes Relative: 5 %
NEUTROS PCT: 44 %
Neutro Abs: 2.9 10*3/uL (ref 1.7–7.7)
PLATELETS: 211 10*3/uL (ref 150–400)
RBC: 3.85 MIL/uL — ABNORMAL LOW (ref 3.87–5.11)
RDW: 15 % (ref 11.5–15.5)
WBC: 6.5 10*3/uL (ref 4.0–10.5)
nRBC: 0 % (ref 0.0–0.2)

## 2018-08-04 LAB — PHOSPHORUS: Phosphorus: 3.5 mg/dL (ref 2.5–4.6)

## 2018-08-04 LAB — COMPREHENSIVE METABOLIC PANEL
ALK PHOS: 50 U/L (ref 38–126)
ALT: 7 U/L (ref 0–44)
AST: 15 U/L (ref 15–41)
Albumin: 3.9 g/dL (ref 3.5–5.0)
Anion gap: 9 (ref 5–15)
BILIRUBIN TOTAL: 0.6 mg/dL (ref 0.3–1.2)
BUN: 12 mg/dL (ref 8–23)
CALCIUM: 9.1 mg/dL (ref 8.9–10.3)
CO2: 22 mmol/L (ref 22–32)
Chloride: 112 mmol/L — ABNORMAL HIGH (ref 98–111)
Creatinine, Ser: 0.84 mg/dL (ref 0.44–1.00)
GFR calc Af Amer: >60 mL/min (ref >60)
GFR calc non Af Amer: >60 mL/min (ref >60)
Glucose, Bld: 107 mg/dL — ABNORMAL HIGH (ref 70–99)
Potassium: 3.1 mmol/L — ABNORMAL LOW (ref 3.5–5.1)
Sodium: 143 mmol/L (ref 135–145)
TOTAL PROTEIN: 7.3 g/dL (ref 6.5–8.1)

## 2018-08-04 LAB — MAGNESIUM: Magnesium: 1.7 mg/dL (ref 1.7–2.4)

## 2018-08-04 NOTE — Telephone Encounter (Signed)
Spoke with patient in the office today. She does not need any iv potassium. She was instructed to continue taking potassium 20 meq 1 tablet daily as directed. I also spoke with her that Rob Hickman has been trying to reach her. She no showed for her md apts at [facility] transplant clinic. She states that she didn't know about these apts, but would contact [facility] once she arrived at home.

## 2018-08-04 NOTE — Progress Notes (Signed)
Pt denies any complaints at this time. Per Nira Conn RN per Dr. Rogue Bussing no IVFs or IV potassium needed at this time. Pt and VS stable at discharge.

## 2018-08-11 ENCOUNTER — Other Ambulatory Visit: Payer: Self-pay

## 2018-08-11 ENCOUNTER — Inpatient Hospital Stay: Payer: Medicare Other

## 2018-08-11 ENCOUNTER — Encounter: Payer: Self-pay | Admitting: Internal Medicine

## 2018-08-11 ENCOUNTER — Inpatient Hospital Stay (HOSPITAL_BASED_OUTPATIENT_CLINIC_OR_DEPARTMENT_OTHER): Payer: Medicare Other | Admitting: Internal Medicine

## 2018-08-11 VITALS — BP 109/73 | HR 76 | Temp 98.7°F | Resp 16 | Ht 61.0 in | Wt 164.2 lb

## 2018-08-11 DIAGNOSIS — F17210 Nicotine dependence, cigarettes, uncomplicated: Secondary | ICD-10-CM | POA: Diagnosis not present

## 2018-08-11 DIAGNOSIS — E876 Hypokalemia: Secondary | ICD-10-CM | POA: Diagnosis not present

## 2018-08-11 DIAGNOSIS — I82C11 Acute embolism and thrombosis of right internal jugular vein: Secondary | ICD-10-CM | POA: Diagnosis not present

## 2018-08-11 DIAGNOSIS — C9002 Multiple myeloma in relapse: Secondary | ICD-10-CM | POA: Diagnosis not present

## 2018-08-11 DIAGNOSIS — Z7901 Long term (current) use of anticoagulants: Secondary | ICD-10-CM

## 2018-08-11 DIAGNOSIS — Z9484 Stem cells transplant status: Secondary | ICD-10-CM

## 2018-08-11 LAB — CBC WITH DIFFERENTIAL/PLATELET
Abs Immature Granulocytes: 0.01 10*3/uL (ref 0.00–0.07)
BASOS PCT: 1 %
Basophils Absolute: 0 10*3/uL (ref 0.0–0.1)
Eosinophils Absolute: 0.5 10*3/uL (ref 0.0–0.5)
Eosinophils Relative: 9 %
HCT: 33.7 % — ABNORMAL LOW (ref 36.0–46.0)
Hemoglobin: 10.9 g/dL — ABNORMAL LOW (ref 12.0–15.0)
Immature Granulocytes: 0 %
Lymphocytes Relative: 49 %
Lymphs Abs: 2.9 10*3/uL (ref 0.7–4.0)
MCH: 30 pg (ref 26.0–34.0)
MCHC: 32.3 g/dL (ref 30.0–36.0)
MCV: 92.8 fL (ref 80.0–100.0)
Monocytes Absolute: 0.4 10*3/uL (ref 0.1–1.0)
Monocytes Relative: 7 %
Neutro Abs: 2 10*3/uL (ref 1.7–7.7)
Neutrophils Relative %: 34 %
Platelets: 225 10*3/uL (ref 150–400)
RBC: 3.63 MIL/uL — ABNORMAL LOW (ref 3.87–5.11)
RDW: 15.2 % (ref 11.5–15.5)
WBC: 5.9 10*3/uL (ref 4.0–10.5)
nRBC: 0 % (ref 0.0–0.2)

## 2018-08-11 LAB — COMPREHENSIVE METABOLIC PANEL
ALBUMIN: 3.8 g/dL (ref 3.5–5.0)
ALT: 6 U/L (ref 0–44)
AST: 13 U/L — ABNORMAL LOW (ref 15–41)
Alkaline Phosphatase: 53 U/L (ref 38–126)
Anion gap: 7 (ref 5–15)
BUN: 10 mg/dL (ref 8–23)
CO2: 25 mmol/L (ref 22–32)
Calcium: 8.9 mg/dL (ref 8.9–10.3)
Chloride: 111 mmol/L (ref 98–111)
Creatinine, Ser: 0.67 mg/dL (ref 0.44–1.00)
GFR calc Af Amer: >60 mL/min (ref >60)
GFR calc non Af Amer: >60 mL/min (ref >60)
Glucose, Bld: 75 mg/dL (ref 70–99)
Potassium: 3.6 mmol/L (ref 3.5–5.1)
Sodium: 143 mmol/L (ref 135–145)
Total Bilirubin: 0.3 mg/dL (ref 0.3–1.2)
Total Protein: 6.9 g/dL (ref 6.5–8.1)

## 2018-08-11 LAB — PHOSPHORUS: Phosphorus: 3.8 mg/dL (ref 2.5–4.6)

## 2018-08-11 LAB — MAGNESIUM: Magnesium: 1.8 mg/dL (ref 1.7–2.4)

## 2018-08-11 NOTE — Progress Notes (Signed)
[place] OFFICE PROGRESS NOTE  Patient Care Team: Center, [facility] as PCP - General ([place]) [name], [name]  Cancer Staging No matching staging information was found for the patient.   Oncology History   # SEP 2018- MULTIPLE MYELOMA IgALamda [2.5 gm/dl; K/L= 88/1298]; STAGE III [beta 2 microglobulin=5.5] [presented with acute renal failure; anemia; NO hypercalcemia; Skeletal survey-Normal]; BMBx- 45% plasma cells; FISH-POSITIVE 11:14 translocation.[STANDARD-high RISK]/cyto-Normal; SEP 2018- PET- L3 posterior element lesion.   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       Multiple myeloma in relapse ([place]   11/07/2017 -  Chemotherapy    The patient had palonosetron (ALOXI) injection 0.25 mg, 0.25 mg, Intravenous,  Once, 6 of 7 cycles Administration: 0.25 mg (11/21/2017), 0.25 mg (11/29/2017), 0.25 mg (12/05/2017), 0.25 mg (12/21/2017), 0.25 mg (12/28/2017), 0.25 mg (01/04/2018), 0.25 mg (01/18/2018), 0.25 mg (01/25/2018), 0.25 mg (02/01/2018), 0.25 mg (02/15/2018), 0.25 mg (02/22/2018), 0.25 mg (03/01/2018), 0.25 mg (03/15/2018), 0.25 mg (03/22/2018), 0.25 mg (03/30/2018), 0.25 mg (04/13/2018), 0.25 mg (04/20/2018), 0.25 mg  (04/27/2018) cyclophosphamide (CYTOXAN) 500 mg in sodium chloride 0.9 % 250 mL chemo infusion, 540 mg, Intravenous,  Once, 6 of 7 cycles Administration: 500 mg (11/21/2017), 500 mg (11/29/2017), 500 mg (12/05/2017), 500 mg (12/28/2017), 500 mg (01/04/2018), 500 mg (01/18/2018), 500 mg (01/25/2018), 500 mg (02/01/2018), 500 mg (02/15/2018), 500 mg (02/22/2018), 500 mg (03/01/2018), 500 mg (03/15/2018), 500 mg (03/30/2018), 500 mg (04/13/2018), 500 mg (04/20/2018), 500 mg (04/27/2018) carfilzomib (KYPROLIS) 36 mg in dextrose 5 % 50 mL chemo infusion, 20 mg/m2 = 36 mg, Intravenous, Once, 6 of 7 cycles Administration: 36 mg (11/21/2017), 36 mg (11/22/2017), 60 mg (11/29/2017), 60 mg (11/30/2017), 60 mg (12/05/2017), 60 mg (12/06/2017), 60 mg (12/21/2017), 60 mg (12/22/2017), 60 mg (12/28/2017), 60 mg (12/29/2017), 60 mg (01/04/2018), 60 mg (01/05/2018), 60 mg (01/18/2018), 60 mg (01/19/2018), 60 mg (01/25/2018), 60 mg (01/27/2018), 60 mg (02/01/2018), 60 mg (02/02/2018), 60 mg (02/15/2018), 60 mg (02/16/2018), 60 mg (02/22/2018), 60 mg (03/01/2018), 60 mg (03/02/2018), 60 mg (03/15/2018), 60 mg (03/16/2018), 60 mg (03/23/2018), 60 mg (03/30/2018), 60 mg (03/31/2018), 60 mg (04/13/2018), 60 mg (04/14/2018), 60 mg (04/20/2018), 60 mg (04/21/2018), 60 mg (04/27/2018)  for chemotherapy treatment.        INTERVAL HISTORY:  Brandi [id number].  female pleasant patient above history of relapsed multiple myeloma -currently status post autologous stem cell transplant-on November 21.   Patient denies any shoulder pain or neck pain.  She continues to be on Xarelto.  No blood in stools no black or stools.  Appetite is good. Gaining weight.  Diarrhea improved.  Up to 1 loose stool a day.  Not needing any antidiarrheals.    Review of Systems  Constitutional: Positive for malaise/fatigue. Negative for chills, diaphoresis, fever and weight loss.       Weight gain.  HENT: Negative for nosebleeds and sore throat.  Eyes: Negative for double vision.  Respiratory: Negative  for cough, hemoptysis, sputum production, shortness of breath and wheezing.   Cardiovascular: Negative for chest pain, palpitations and orthopnea.  Gastrointestinal: Negative for abdominal pain, blood in stool, constipation, heartburn, melena and vomiting.  Genitourinary: Negative for dysuria, frequency and urgency.  Skin: Negative.  Negative for itching and rash.  Neurological: Negative for dizziness, tingling, focal weakness, weakness and headaches.  Endo/Heme/Allergies: Does not bruise/bleed easily.  Psychiatric/Behavioral: Negative for depression. The patient is not nervous/anxious and does not have insomnia.       PAST MEDICAL HISTORY :  Past Medical History:  Diagnosis Date  . Hypertension   . Multiple myeloma ([place])     PAST SURGICAL HISTORY :   Past Surgical History:  Procedure Laterality Date  . IR FLUORO GUIDE PORT INSERTION RIGHT  12/16/2017    FAMILY HISTORY :   Family History  Problem Relation Age of Onset  . Pneumonia Mother   . Seizures Father     SOCIAL HISTORY:   Social History   Tobacco Use  . Smoking status: Current Every Day Smoker    Packs/day: 0.50    Types: Cigarettes  . Smokeless tobacco: Never Used  Substance Use Topics  . Alcohol use: No  . Drug use: No    ALLERGIES:  has No Known Allergies.  MEDICATIONS:  Current Outpatient Medications  Medication Sig Dispense Refill  . acyclovir (ZOVIRAX) 400 MG tablet One pill a day [to prevent shingles] 30 tablet 6  . albuterol (PROVENTIL HFA;VENTOLIN HFA) 108 (90 Base) MCG/ACT inhaler Inhale 2 puffs into the lungs every 6 (six) hours as needed for wheezing or shortness of breath. 1 Inhaler 2  . amLODipine (NORVASC) 10 MG tablet Take 1 tablet (10 mg total) by mouth daily. 30 tablet 4  . ergocalciferol (VITAMIN D2) 1.25 MG (50000 UT) capsule Take 1 capsule (50,000 Units total) by mouth once a week. 12 capsule 1  . feeding supplement, ENSURE ENLIVE, (ENSURE ENLIVE) LIQD Take 237 mLs by mouth 2 (two) times  daily between meals. 60 Bottle 0  . loratadine (CLARITIN) 10 MG tablet Take 1 tablet (10 mg total) by mouth daily. Take daily x 4 days (start 2 days prior to chemo) 30 tablet 3  . montelukast (SINGULAIR) 10 MG tablet Take 1 tablet (10 mg total) by mouth at bedtime. Start 2 days prior to chemo. Take for 4 days total 30 tablet 3  . nystatin (MYCOSTATIN) 100000 UNIT/ML suspension Take 5 mLs (500,000 Units total) by mouth 4 (four) times daily. 60 mL 0  . ondansetron (ZOFRAN) 8 MG tablet One pill every 8 hours as needed for nausea/vomitting. 40 tablet 1  . oxyCODONE (OXY IR/ROXICODONE) 5 MG immediate release tablet Take 1 tablet (5 mg total) by mouth every 4 (four) hours as needed for severe pain. 60 tablet 0  . pantoprazole (PROTONIX) 40 MG tablet Take 1 tablet (40 mg total) by mouth daily. 90 tablet 3  . polyethylene glycol (MIRALAX / GLYCOLAX) packet Take 17 g by mouth daily as needed for mild constipation. 14 each 0  . potassium chloride SA (K-DUR,KLOR-CON) 20 MEQ tablet Take 1 tablet (20 mEq total) by mouth 2 (two) times daily. 14 tablet 1  . prochlorperazine (COMPAZINE) 10 MG tablet Take 10 mg by mouth every 6 (six) hours as needed for nausea or vomiting.    . ranitidine (ZANTAC) 150 MG tablet Take 1 tablet (150 mg total) by mouth 2 (two) times daily. START TAKING  2 DAYS PRIOR TO CHEMOTHERAPY. TAKE FOR A TOTAL OF 4 DAYS    . rivaroxaban (XARELTO) 20 MG TABS tablet Take 1 tablet (20 mg total) by mouth daily with supper. Start after finishing your "starter pack". 30 tablet 2   No current facility-administered medications for this visit.    Facility-Administered Medications Ordered in Other Visits  Medication Dose Route Frequency Provider Last Rate Last Dose  . sodium chloride flush (NS) 0.9 % injection 10 mL  10 mL Intracatheter PRN [name], MD   10 mL at 02/23/18 1400    PHYSICAL EXAMINATION: ECOG PERFORMANCE STATUS: 1 - Symptomatic but completely ambulatory  BP 109/73 (BP  Location: Right Arm, Patient Position: Sitting)   Pulse 76   Temp 98.7 F (37.1 C) (Tympanic)   Resp 16   Ht '5\' 1"'  (1.549 m)   Wt 164 lb 3.2 oz (74.5 kg)   BMI 31.03 kg/m   Filed Weights   08/11/18 1124  Weight: 164 lb 3.2 oz (74.5 kg)    Physical Exam  Constitutional: She is oriented to person, place, and time and well-developed, well-nourished, and in no distress.  She is with her son.   HENT:  Head: Normocephalic and atraumatic.  Mild neck swelling mildly tender.  No masses felt.  Eyes: Pupils are equal, round, and reactive to light.  Neck: Normal range of motion. Neck supple.  Cardiovascular: Normal rate and regular rhythm.  Pulmonary/Chest: No respiratory distress. She has no wheezes.  Abdominal: Soft. Bowel sounds are normal. She exhibits no distension and no mass. There is no abdominal tenderness. There is no rebound and no guarding.  Musculoskeletal: Normal range of motion.        General: No tenderness. Edema: Bilateral ankle edema.  Neurological: She is alert and oriented to person, place, and time.  Skin: Skin is warm.  Psychiatric: Affect normal.       LABORATORY DATA:  I have reviewed the data as listed    Component Value Date/Time   NA 143 08/11/2018 1113   K 3.6 08/11/2018 1113   CL 111 08/11/2018 1113   CO2 25 08/11/2018 1113   GLUCOSE 75 08/11/2018 1113   BUN 10 08/11/2018 1113   CREATININE 0.67 08/11/2018 1113   CALCIUM 8.9 08/11/2018 1113   PROT 6.9 08/11/2018 1113   ALBUMIN 3.8 08/11/2018 1113   AST 13 (L) 08/11/2018 1113   ALT 6 08/11/2018 1113   ALKPHOS 53 08/11/2018 1113   BILITOT 0.3 08/11/2018 1113   GFRNONAA >60 08/11/2018 1113   GFRAA >60 08/11/2018 1113    No results found for: SPEP, UPEP  Lab Results  Component Value Date   WBC 5.9 08/11/2018   NEUTROABS 2.0 08/11/2018   HGB 10.9 (L) 08/11/2018   HCT 33.7 (L) 08/11/2018   MCV 92.8 08/11/2018   PLT 225 08/11/2018      Chemistry      Component Value Date/Time   NA 143  08/11/2018 1113   K 3.6 08/11/2018 1113   CL 111 08/11/2018 1113   CO2 25 08/11/2018 1113   BUN 10 08/11/2018 1113   CREATININE 0.67 08/11/2018 1113      Component Value Date/Time   CALCIUM 8.9 08/11/2018 1113   ALKPHOS 53 08/11/2018 1113   AST 13 (L) 08/11/2018 1113   ALT 6 08/11/2018 1113   BILITOT 0.3 08/11/2018 1113       RADIOGRAPHIC STUDIES: I have personally reviewed the radiological images as listed and agreed with the  findings in the report. No results found.   ASSESSMENT & PLAN:  Multiple myeloma in relapse [facility]) #Multiple myeloma stage III high-risk cytogenetics-status post KRD-VG PR ; status post autologousstem cell transplant on 06/15/18.   # + Multiple myeloma clinically stable.  Labs-within normal limits except for hemoglobin slightly low at 9.8.  Reiterated the importance of follow-up at [facility].  Given the contact information.  # Hypokalemia-today potassium is 3.7.  Continue potassium 20 mg a day.  # shouder pain-currently resolved..  # Right jugular DVT- continue Xarelto 20 mg a day.  # Infection prophylaxis: Continue acyclovir;  # DISPOSITION: # follow up in 3 weeks- with MD- labs- cbc/cmp/mag/phos-  possible fluids 1hour/ possible Potassium over 2hours.   No orders of the defined types were placed in this encounter.  All questions were answered. The patient knows to call the clinic with any problems, questions or concerns.      [name], MD 08/11/2018 12:16 PM

## 2018-08-11 NOTE — Progress Notes (Signed)
Patient here for follow up. She reports no changes since her last visit. 

## 2018-08-11 NOTE — Patient Instructions (Signed)
#   please make appt with [facility]/ ASAP; Brandi [name]- [phone].

## 2018-08-11 NOTE — Assessment & Plan Note (Addendum)
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   # + Multiple myeloma clinically stable.  Labs-within normal limits except for hemoglobin slightly low at 9.8.  Reiterated the importance of follow-up at [facility].  Given the contact information.  # Hypokalemia-today potassium is 3.7.  Continue potassium 20 mg a day.  # shouder pain-currently resolved..  # Right jugular DVT- continue Xarelto 20 mg a day.  # Infection prophylaxis: Continue acyclovir;  # DISPOSITION: # follow up in 3 weeks- with MD- labs- cbc/cmp/mag/phos-  possible fluids 1hour/ possible Potassium over 2hours.

## 2018-08-29 ENCOUNTER — Other Ambulatory Visit: Payer: Self-pay | Admitting: *Deleted

## 2018-08-29 DIAGNOSIS — J3489 Other specified disorders of nose and nasal sinuses: Secondary | ICD-10-CM

## 2018-08-29 DIAGNOSIS — C9002 Multiple myeloma in relapse: Secondary | ICD-10-CM

## 2018-08-29 NOTE — Telephone Encounter (Signed)
Brandi [name], I spoke with Dr. Rogue Bussing. Patient needs to be evaluated in the clinic by NP first before narcotic can be filled. This is acute pain. She does have an apt this week with Dr. Jacinto Reap. She can either keep that apt or if she desires she can see NP sooner. MD will not fill this script until patient is evaluated. Pt has multiple myeloma.

## 2018-08-29 NOTE — Addendum Note (Signed)
Addended by: Betti Cruz on: 08/29/2018 03:26 PM   Modules accepted: Orders

## 2018-08-29 NOTE — Telephone Encounter (Signed)
Patient called asking for pain medicine stating she is having pain and needs medication for it

## 2018-08-29 NOTE — Telephone Encounter (Signed)
Per Almyra Free, CMA and Gaspar Bidding, NP, patient showed up in the cancer center to follow-up on her phone call. pt stated that 'no one has returned her phone call regarding her narcotic refill. Patient reportedly very anxious and upset that script has not been sent to pharmacy or phone call returned.' Staff explained that Dr. Rogue Bussing was in [facility] today and they would give the team the msg regarding her concerns. Patient reportedly left the building.  At 1340-I was made aware of the patient's concerns. I reached out to the patient via phone call. Husband answered the phone and stated the patient was not home yet and that she was the doctor's office. I asked the husband to let the patient know that I was returning her phone call to address her concerns.  At the last apt, Dr. B documented that pain was "resolved." When patient calls back, RN will need to reassess pain concerns.

## 2018-08-29 NOTE — Telephone Encounter (Signed)
Patient complains of cold symptoms and pain in her back "from my cancer" Appointment accepted for 830 tomorrow to see NP in [facility] and have labs

## 2018-08-30 ENCOUNTER — Other Ambulatory Visit: Payer: Self-pay

## 2018-08-30 ENCOUNTER — Inpatient Hospital Stay: Payer: Medicare Other | Attending: Oncology | Admitting: *Deleted

## 2018-08-30 ENCOUNTER — Inpatient Hospital Stay: Payer: Medicare Other

## 2018-08-30 ENCOUNTER — Inpatient Hospital Stay (HOSPITAL_BASED_OUTPATIENT_CLINIC_OR_DEPARTMENT_OTHER): Payer: Medicare Other | Admitting: Oncology

## 2018-08-30 ENCOUNTER — Ambulatory Visit
Admission: RE | Admit: 2018-08-30 | Discharge: 2018-08-30 | Disposition: A | Discharge status: Home / Self Care | Payer: Medicare Other | Source: Ambulatory Visit | Attending: Oncology | Admitting: Oncology

## 2018-08-30 ENCOUNTER — Encounter: Payer: Self-pay | Admitting: Oncology

## 2018-08-30 VITALS — BP 118/83 | HR 110 | Temp 98.5°F | Resp 18 | Wt 153.0 lb

## 2018-08-30 DIAGNOSIS — R69 Illness, unspecified: Principal | ICD-10-CM

## 2018-08-30 DIAGNOSIS — R05 Cough: Secondary | ICD-10-CM | POA: Diagnosis not present

## 2018-08-30 DIAGNOSIS — I10 Essential (primary) hypertension: Secondary | ICD-10-CM | POA: Insufficient documentation

## 2018-08-30 DIAGNOSIS — M25511 Pain in right shoulder: Secondary | ICD-10-CM | POA: Diagnosis not present

## 2018-08-30 DIAGNOSIS — F17210 Nicotine dependence, cigarettes, uncomplicated: Secondary | ICD-10-CM | POA: Diagnosis not present

## 2018-08-30 DIAGNOSIS — R070 Pain in throat: Secondary | ICD-10-CM | POA: Diagnosis not present

## 2018-08-30 DIAGNOSIS — M545 Low back pain: Secondary | ICD-10-CM

## 2018-08-30 DIAGNOSIS — Z7901 Long term (current) use of anticoagulants: Secondary | ICD-10-CM | POA: Diagnosis not present

## 2018-08-30 DIAGNOSIS — C9002 Multiple myeloma in relapse: Secondary | ICD-10-CM | POA: Diagnosis not present

## 2018-08-30 DIAGNOSIS — J101 Influenza due to other identified influenza virus with other respiratory manifestations: Secondary | ICD-10-CM

## 2018-08-30 DIAGNOSIS — I82C11 Acute embolism and thrombosis of right internal jugular vein: Secondary | ICD-10-CM | POA: Diagnosis not present

## 2018-08-30 DIAGNOSIS — Z9484 Stem cells transplant status: Secondary | ICD-10-CM | POA: Insufficient documentation

## 2018-08-30 DIAGNOSIS — J111 Influenza due to unidentified influenza virus with other respiratory manifestations: Secondary | ICD-10-CM | POA: Insufficient documentation

## 2018-08-30 DIAGNOSIS — E876 Hypokalemia: Secondary | ICD-10-CM | POA: Insufficient documentation

## 2018-08-30 DIAGNOSIS — R102 Pelvic and perineal pain: Secondary | ICD-10-CM | POA: Diagnosis not present

## 2018-08-30 DIAGNOSIS — C9000 Multiple myeloma not having achieved remission: Secondary | ICD-10-CM

## 2018-08-30 DIAGNOSIS — M549 Dorsalgia, unspecified: Secondary | ICD-10-CM

## 2018-08-30 DIAGNOSIS — R0602 Shortness of breath: Secondary | ICD-10-CM | POA: Insufficient documentation

## 2018-08-30 DIAGNOSIS — R059 Cough, unspecified: Secondary | ICD-10-CM

## 2018-08-30 DIAGNOSIS — E860 Dehydration: Secondary | ICD-10-CM

## 2018-08-30 DIAGNOSIS — J3489 Other specified disorders of nose and nasal sinuses: Secondary | ICD-10-CM

## 2018-08-30 LAB — CBC WITH DIFFERENTIAL/PLATELET
Abs Immature Granulocytes: 0 10*3/uL (ref 0.00–0.07)
BASOS ABS: 0 10*3/uL (ref 0.0–0.1)
Basophils Relative: 0 %
EOS PCT: 2 %
Eosinophils Absolute: 0.1 10*3/uL (ref 0.0–0.5)
HCT: 41.8 % (ref 36.0–46.0)
Hemoglobin: 13.4 g/dL (ref 12.0–15.0)
Immature Granulocytes: 0 %
Lymphocytes Relative: 75 %
Lymphs Abs: 4.3 10*3/uL — ABNORMAL HIGH (ref 0.7–4.0)
MCH: 28.7 pg (ref 26.0–34.0)
MCHC: 32.1 g/dL (ref 30.0–36.0)
MCV: 89.5 fL (ref 80.0–100.0)
MONOS PCT: 3 %
Monocytes Absolute: 0.2 10*3/uL (ref 0.1–1.0)
Neutro Abs: 1.1 10*3/uL — ABNORMAL LOW (ref 1.7–7.7)
Neutrophils Relative %: 20 %
Platelets: 168 10*3/uL (ref 150–400)
RBC: 4.67 MIL/uL (ref 3.87–5.11)
RDW: 14.6 % (ref 11.5–15.5)
WBC: 5.7 10*3/uL (ref 4.0–10.5)
nRBC: 0 % (ref 0.0–0.2)

## 2018-08-30 LAB — URINALYSIS, COMPLETE (UACMP) WITH MICROSCOPIC
Bilirubin Urine: NEGATIVE
Glucose, UA: NEGATIVE mg/dL
Ketones, ur: 5 mg/dL — AB
Leukocytes, UA: NEGATIVE
NITRITE: NEGATIVE
Protein, ur: 100 mg/dL — AB
Specific Gravity, Urine: 1.019 (ref 1.005–1.030)
pH: 5 (ref 5.0–8.0)

## 2018-08-30 LAB — INFLUENZA PANEL BY PCR (TYPE A & B)
Influenza A By PCR: POSITIVE — AB
Influenza B By PCR: NEGATIVE

## 2018-08-30 LAB — COMPREHENSIVE METABOLIC PANEL
ALT: 12 U/L (ref 0–44)
AST: 19 U/L (ref 15–41)
Albumin: 4.6 g/dL (ref 3.5–5.0)
Alkaline Phosphatase: 49 U/L (ref 38–126)
Anion gap: 7 (ref 5–15)
BUN: 15 mg/dL (ref 8–23)
CO2: 21 mmol/L — ABNORMAL LOW (ref 22–32)
Calcium: 8.6 mg/dL — ABNORMAL LOW (ref 8.9–10.3)
Chloride: 108 mmol/L (ref 98–111)
Creatinine, Ser: 0.69 mg/dL (ref 0.44–1.00)
GFR calc Af Amer: >60 mL/min (ref >60)
GFR calc non Af Amer: >60 mL/min (ref >60)
GLUCOSE: 107 mg/dL — AB (ref 70–99)
Potassium: 3.3 mmol/L — ABNORMAL LOW (ref 3.5–5.1)
Sodium: 136 mmol/L (ref 135–145)
Total Bilirubin: 0.3 mg/dL (ref 0.3–1.2)
Total Protein: 8.1 g/dL (ref 6.5–8.1)

## 2018-08-30 MED ORDER — TRAMADOL HCL 50 MG PO TABS: 50.0000 mg | ORAL_TABLET | Freq: Four times a day (QID) | ORAL | 0 refills | Status: DC | PRN

## 2018-08-30 MED ORDER — OSELTAMIVIR PHOSPHATE 75 MG PO CAPS: 75.0000 mg | ORAL_CAPSULE | Freq: Two times a day (BID) | ORAL | 0 refills | Status: DC

## 2018-08-30 MED ORDER — KETOROLAC TROMETHAMINE 15 MG/ML IJ SOLN
30.0000 mg | Freq: Once | INTRAMUSCULAR | Status: AC
  Administered 2018-08-30: 30 mg via INTRAVENOUS

## 2018-08-30 MED ORDER — KETOROLAC TROMETHAMINE 15 MG/ML IJ SOLN
INTRAMUSCULAR | Status: AC
  Filled 2018-08-30: qty 2

## 2018-08-30 MED ORDER — SODIUM CHLORIDE 0.9 % IV SOLN
Freq: Once | INTRAVENOUS | Status: AC
  Administered 2018-08-30: 10:00:00 via INTRAVENOUS
  Filled 2018-08-30: qty 250

## 2018-08-30 NOTE — Patient Instructions (Signed)
It was great to see you today.  I am sorry you are not feeling well.  Please go have a chest x-ray completed today.  I will call you with results from your flu swab, chest x-ray and urinalysis.  We will treat you with the appropriate antibiotics as needed.  For your right shoulder pain it is okay to take over-the-counter ibuprofen 3 times a day.  I recommend 400 mg 3 times daily.  This is an anti-inflammatory and should help if this is muscular.  Previously, we thought this may be from your blood clot in the right jugular vein.  It does not appear to be improving with treatment of Eliquis.  Review the materials on musculoskeletal pain below.  He also can try heat and/or ice to affected area to see if this may help.  Please call us back if symptoms do not resolve and/or worsen.  Thanks Brandi Casa, NP 08/30/2018 11:14 AM  Musculoskeletal Pain Musculoskeletal pain refers to aches and pains in your bones, joints, muscles, and the tissues that surround them. This pain can occur in any part of the body. It can last for a short time (acute) or a long time (chronic). A physical exam, lab tests, and imaging studies may be done to find the cause of your musculoskeletal pain. Follow these instructions at home:  Lifestyle  Try to control or lower your stress levels. Stress increases muscle tension and can worsen musculoskeletal pain. It is important to recognize when you are anxious or stressed and learn ways to manage it. This may include: ? Meditation or yoga. ? Cognitive or behavioral therapy. ? Acupuncture or massage therapy.  You may continue all activities unless the activities cause more pain. When the pain gets better, slowly resume your normal activities. Gradually increase the intensity and duration of your activities or exercise. Managing pain, stiffness, and swelling  Take over-the-counter and prescription medicines only as told by your health care provider.  When your pain is  severe, bed rest may be helpful. Lie or sit in any position that is comfortable, but get out of bed and walk around at least every couple of hours.  If directed, apply heat to the affected area as often as told by your health care provider. Use the heat source that your health care provider recommends, such as a moist heat pack or a heating pad. ? Place a towel between your skin and the heat source. ? Leave the heat on for 20-30 minutes. ? Remove the heat if your skin turns bright red. This is especially important if you are unable to feel pain, heat, or cold. You may have a greater risk of getting burned.  If directed, put ice on the painful area. ? Put ice in a plastic bag. ? Place a towel between your skin and the bag. ? Leave the ice on for 20 minutes, 2-3 times a day. General instructions  Your health care provider may recommend that you see a physical therapist. This person can help you come up with a safe exercise program. Do any exercises as told by your physical therapist.  Keep all follow-up visits, including any physical therapy visits, as told by your health care providers. This is important. Contact a health care provider if:  Your pain gets worse.  Medicines do not help ease your pain.  You cannot use the part of your body that hurts, such as your arm, leg, or neck.  You have trouble sleeping.  You have trouble  doing your normal activities. Get help right away if:  You have a new injury and your pain is worse or different.  You feel numb or you have tingling in the painful area. Summary  Musculoskeletal pain refers to aches and pains in your bones, joints, muscles, and the tissues that surround them.  This pain can occur in any part of the body.  Your health care provider may recommend that you see a physical therapist. This person can help you come up with a safe exercise program. Do any exercises as told by your physical therapist.  Lower your stress level.  Stress can worsen musculoskeletal pain. Ways to lower stress may include meditation, yoga, cognitive or behavioral therapy, acupuncture, and massage therapy. This information is not intended to replace advice given to you by your health care provider. Make sure you discuss any questions you have with your health care provider. Document Released: 07/12/2005 Document Revised: 08/11/2016 Document Reviewed: 08/11/2016 Elsevier Interactive Patient Education  2019 [employer].

## 2018-08-30 NOTE — Progress Notes (Signed)
Symptom Management Consult note Brandi [name]  [phone] [phone]  Patient Care Team: Center, [facility] as PCP - General (General Practice) [name], [name]   Name of the patient: Brandi [name]  [id number]  01/06/1951   Date of visit: 08/30/2018  Diagnosis: Multiple myeloma  Chief Complaint: Low Back Pain/cough/right shoulder pain  Current Treatment: S/p autologous stem cell transplant on 06/15/2018.  Surveillance  Oncology History: Patient last seen by primary oncologist Dr. Rogue Bussing on 08/11/2018 for follow-up s/p autologous stem cell transplant on 06/15/2018.  At that visit, she denied any shoulder or neck pain.  She continued 20 mg Xarelto for right jugular DVT and denied any bleeding and or blood in stools.  Her appetite continued to improve and she continued to gain weight.  She admitted to improving diarrhea with only 1 loose stool daily.  She was not using antidiarrheals.  She was encouraged to continue her potassium supplements given history of hypokalemia.  She was scheduled to return to clinic in 3 weeks with MD assessment, labs and possible IV fluids and IV potassium.   In the interim, patient was evaluated at [name] on 08/22/2018 by Dr. Alvie Heidelberg where she reported feeling well but complained of right shoulder pain for the past few weeks. She declined an x-ray.  Scheduled to return in 6 weeks to discuss initiating maintenance therapy.  Discussed continuing prophylaxis medications including acyclovir 400 mg twice daily (1 year), Zometa (3 to 4 months post transplant) and Xarelto (4 to 6 months) for DVT treatment.  Okayed to resume aspirin once discontinuing Xarelto.  Discussed initiating Claritin for occasional itching and hives.  S/p BMT on 06/15/2018 through 06/30/2018.  Tolerated well.  Had one episode of febrile neutropenia on 06/25/2018.  Started on vancomycin and  Zosyn.  Cultures were negative and antibiotics were discontinued on 06/28/2018 with a WBC engraftment.  Complained of severe headache on 06/28/2018 given thrombocytopenia CT of the head was performed which was negative.  Headache resolved with oxycodone.  At discharge, she reported intermittent nausea and emesis worse with water intake.  Had small amount of loose stool.  Instructed to continue acyclovir prophylactically, potassium supplementation and calcium and vitamin D.  She no longer requires antibacterial antifungal prophylaxis with WBC engraftment.  Oncology History   # SEP 2018- MULTIPLE MYELOMA IgALamda [2.5 gm/dl; K/L= 88/1298]; STAGE III [beta 2 microglobulin=5.5] [presented with acute renal failure; anemia; NO hypercalcemia; Skeletal survey-Normal]; BMBx- 45% plasma cells; FISH-POSITIVE 11:14 translocation.[STANDARD-high RISK]/cyto-Normal; SEP 2018- PET- L3 posterior element lesion.   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       [place]   11/07/2017 -  Chemotherapy    The patient had palonosetron (ALOXI) injection 0.25 mg, 0.25 mg, Intravenous,  Once, 6 of 7 cycles Administration: 0.25 mg (11/21/2017), 0.25 mg (11/29/2017), 0.25 mg (12/05/2017), 0.25 mg (12/21/2017), 0.25 mg (12/28/2017),  0.25 mg (  01/04/2018), 0.25 mg (01/18/2018), 0.25 mg (01/25/2018), 0.25 mg (02/01/2018), 0.25 mg (02/15/2018), 0.25 mg (02/22/2018), 0.25 mg (03/01/2018), 0.25 mg (03/15/2018), 0.25 mg (03/22/2018), 0.25 mg (03/30/2018), 0.25 mg (04/13/2018), 0.25 mg (04/20/2018), 0.25 mg (04/27/2018) cyclophosphamide (CYTOXAN) 500 mg in sodium chloride 0.9 % 250 mL chemo infusion, 540 mg, Intravenous,  Once, 6 of 7 cycles Administration: 500 mg (11/21/2017), 500 mg (11/29/2017), 500 mg (12/05/2017), 500 mg (12/28/2017), 500 mg (01/04/2018), 500 mg (01/18/2018), 500 mg (01/25/2018), 500 mg (02/01/2018), 500 mg (02/15/2018), 500 mg (02/22/2018), 500 mg (03/01/2018), 500 mg (03/15/2018), 500 mg (03/30/2018), 500 mg (04/13/2018), 500 mg (04/20/2018), 500 mg (04/27/2018) carfilzomib (KYPROLIS) 36 mg in dextrose 5 % 50 mL chemo infusion, 20 mg/m2 = 36 mg, Intravenous, Once, 6 of 7 cycles Administration: 36 mg (11/21/2017), 36 mg (11/22/2017), 60 mg (11/29/2017), 60 mg (11/30/2017), 60 mg (12/05/2017), 60 mg (12/06/2017), 60 mg (12/21/2017), 60 mg (12/22/2017), 60 mg (12/28/2017), 60 mg (12/29/2017), 60 mg (01/04/2018), 60 mg (01/05/2018), 60 mg (01/18/2018), 60 mg (01/19/2018), 60 mg (01/25/2018), 60 mg (01/27/2018), 60 mg (02/01/2018), 60 mg (02/02/2018), 60 mg (02/15/2018), 60 mg (02/16/2018), 60 mg (02/22/2018), 60 mg (03/01/2018), 60 mg (03/02/2018), 60 mg (03/15/2018), 60 mg (03/16/2018), 60 mg (03/23/2018), 60 mg (03/30/2018), 60 mg (03/31/2018), 60 mg (04/13/2018), 60 mg (04/14/2018), 60 mg (04/20/2018), 60 mg (04/21/2018), 60 mg (04/27/2018)  for chemotherapy treatment.      Subjective Data:  ECOG: 1 - Symptomatic but completely ambulatory    Subjective:    Brandi [name] is a 68 y.o. female who presents for evaluation of low back pain, cough and shortness of breath. The patient has had no prior back problems, cough or shortness of breath. Symptoms have been present for 3 days and are gradually worsening.  Onset was related to / precipitated by no known injury.  She denies sick contacts  with cough.  She denies fevers.  The pain is located in the across the lower back and radiates to the pelvis. The pain is described as aching and throbbing and occurs intermittently. She is currently in 5 out of 10 low back and shoulder pain.  Symptoms are exacerbated by nothing in particular. Symptoms are improved by narcotic pain medications and rest. She has also tried acetaminophen, heat, NSAIDs and rest which provided no symptom relief.  Patient has history of sinusitis and has been treated with antibiotics in the past.  Remembers similar symptoms.  Admits to postnasal drainage and clear sputum production.  She denies urinary tract-like symptoms, chest pain or abdominal pain.  She denies constipation and/or diarrhea.   The following portions of the patient's history were reviewed and updated as appropriate: allergies, current medications, past family history, past medical history, past social history, past surgical history and problem list.  Review of Systems A comprehensive review of systems was negative except for: Constitutional: positive for fatigue and malaise Respiratory: positive for cough and dyspnea on exertion Cardiovascular: positive for dyspnea and fatigue Gastrointestinal: positive for pelvic pain Genitourinary: positive for decreased urination Musculoskeletal: positive for back pain, muscle weakness and myalgias Neurological: positive for weakness    Objective:    Physical Exam Constitutional:      Appearance: Normal appearance.  HENT:     Head: Normocephalic and atraumatic.  Eyes:     Pupils: Pupils are equal, round, and reactive to light.  Neck:     Musculoskeletal: Normal range of motion.  Cardiovascular:     Rate and Rhythm: Normal rate and regular rhythm.  Heart sounds: Normal heart sounds. No murmur.  Pulmonary:     Effort: Pulmonary effort is normal.     Breath sounds: Normal breath sounds. No wheezing.  Abdominal:     General: Bowel sounds are normal. There  is no distension.     Palpations: Abdomen is soft.     Tenderness: There is no abdominal tenderness.  Musculoskeletal: Normal range of motion.  Skin:    General: Skin is warm and dry.     Findings: No rash.  Neurological:     Mental Status: She is alert and oriented to person, place, and time.  Psychiatric:        Judgment: Judgment normal.    Assessment:    Nonspecific acute low back pain   Cough and congestion r/t positive for influenza A.   Plan:     Multiple myeloma: S/p BMT with melphalan conditioning.  [name] course 06/15/2018 through 06/30/2018. S/p Velcade/dexamethasone biweekly and Revlimid beginning 04/08/2017.  April 2019 progression.  Bone marrow biopsy revealed a 6% plasma cell.  Most recent bone survey did not reveal any lytic lesions.  She is scheduled to return to clinic for follow-up with Dr. Rogue Bussing on 09/01/2018 with labs, assessment and possible IV fluids/potassium.   Per previous note from [place] adult bone marrow transplant team she is scheduled for follow-up with Dr. Alvie Heidelberg in approximately 6 weeks to discuss initiating maintenance therapy.  Shortness of breath/cough: Worsening x1 week.  Plan to get chest x-ray and test her for influenza. (See discussion below).  On assessment, no wheezing and or fluid auscultated.  Lungs are diminished bilaterally.  Has history of smoking.  Low back pain radiating to pelvis: No obvious deformity on assessment.  Present x3 days.  Plan to check a urinalysis and urine culture for UTI/kidney infection.  Urinalysis not overly convincing for UTI.  Will wait for culture.  Given she is positive for the flu, this is likely related to the virus.  If symptoms do not improve after 5 days with Tamiflu for treatment, may need imaging.   Right shoulder pain: Initially thought to be due to right jugular thrombus.  She has been treated with Eliquis for over 6 weeks now.  She does not note any improvement.  Imaging has been recommended by both  Dr. Rogue Bussing and Dr. Dimas Millin if pain persists.  Recommend imaging once flu symptoms resolve.  She can take tramadol for persistent right shoulder pain.  Prescription sent in today.  Hypokalemia: Currently on potassium supplementation.  Increase potassium supplements to 40 mEq twice daily x2 days. Continue 20 mEq on day 3.   Plan: Stat labs. (Abnormal: K 3.3, Calcium 8.6, ANC 1.1).    Vital signs.  ( HR 110; Improved with fluids.  Afebrile.) Weight loss.  (9 pounds since 08/11/2018) Check stat UA/urine culture.  (UA negative; Awaiting culture.)  Stat influenza swab.  Positive for influenza A. Stat chest x-ray.  Negative.   In clinic administration: Give 1 L NaCl. Give 30 mg Toradol for low back pain and right shoulder pain.   Prescriptions/Medication Change: RX tramadol 50 mg every 6 hours as needed for pain. RX Tamiflu 75 mg twice daily x5 days. Increase potassium to 40 mEq twice daily x2 days.  Future Labs/Imaging: DG right shoulder once influenza symptoms improve Repeat Potassium level on Friday.     Disposition: RTC on Friday with Potassium lab only, md assessment and possible IV fluids/potassium.   Greater than 50% was spent in counseling and coordination of  care with this patient including but not limited to discussion of the relevant topics above (See A&P) including, but not limited to diagnosis and management of acute and chronic medical conditions.   Faythe Casa, NP 08/30/2018 10:01 AM

## 2018-08-31 LAB — URINE CULTURE: CULTURE: NO GROWTH

## 2018-09-01 ENCOUNTER — Other Ambulatory Visit: Payer: Self-pay

## 2018-09-01 ENCOUNTER — Inpatient Hospital Stay (HOSPITAL_BASED_OUTPATIENT_CLINIC_OR_DEPARTMENT_OTHER): Payer: Medicare Other | Admitting: Internal Medicine

## 2018-09-01 ENCOUNTER — Inpatient Hospital Stay: Payer: Medicare Other

## 2018-09-01 ENCOUNTER — Encounter: Payer: Self-pay | Admitting: Internal Medicine

## 2018-09-01 VITALS — BP 116/80 | HR 94 | Temp 97.9°F | Resp 20 | Ht 61.0 in | Wt 153.0 lb

## 2018-09-01 DIAGNOSIS — J111 Influenza due to unidentified influenza virus with other respiratory manifestations: Secondary | ICD-10-CM

## 2018-09-01 DIAGNOSIS — E876 Hypokalemia: Secondary | ICD-10-CM

## 2018-09-01 DIAGNOSIS — F17210 Nicotine dependence, cigarettes, uncomplicated: Secondary | ICD-10-CM

## 2018-09-01 DIAGNOSIS — C9002 Multiple myeloma in relapse: Secondary | ICD-10-CM | POA: Diagnosis not present

## 2018-09-01 DIAGNOSIS — I10 Essential (primary) hypertension: Secondary | ICD-10-CM

## 2018-09-01 DIAGNOSIS — I82C11 Acute embolism and thrombosis of right internal jugular vein: Secondary | ICD-10-CM

## 2018-09-01 DIAGNOSIS — Z9484 Stem cells transplant status: Secondary | ICD-10-CM

## 2018-09-01 DIAGNOSIS — Z7901 Long term (current) use of anticoagulants: Secondary | ICD-10-CM

## 2018-09-01 LAB — POTASSIUM: Potassium: 3.4 mmol/L — ABNORMAL LOW (ref 3.5–5.1)

## 2018-09-01 NOTE — Assessment & Plan Note (Addendum)
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   # + Multiple myeloma clinically stable. Awaiting re-eval at [place] in 1 month with regards recommendations for maintenance therapy.  # Recent flu-not complicated.  Continue Tamiflu; recommend albuterol 4-6 hours prn.   # Hypokalemia-today potassium is 3.4.  Continue potassium 20 mg a day.  # Right jugular DVT- continue Xarelto 20 mg a day [x 3 months]  # Infection prophylaxis: Continue acyclovir; stable.   # DISPOSITION: # no infusion today # follow up in 3 weeks/labs- cbc/cmp-Dr.B

## 2018-09-01 NOTE — Progress Notes (Signed)
[facility] OFFICE PROGRESS NOTE  Patient Care Team: Center, [facility] as PCP - General ([facility]) [name], [name]  Cancer Staging No matching staging information was found for the patient.   Oncology History   # SEP 2018- MULTIPLE MYELOMA IgALamda [2.5 gm/dl; K/L= 88/1298]; STAGE III [beta 2 microglobulin=5.5] [presented with acute renal failure; anemia; NO hypercalcemia; Skeletal survey-Normal]; BMBx- 45% plasma cells; FISH-POSITIVE 11:14 translocation.[STANDARD-high RISK]/cyto-Normal; SEP 2018- PET- L3 posterior element lesion.   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       Multiple myeloma in relapse ([place]   11/07/2017 -  Chemotherapy    The patient had palonosetron (ALOXI) injection 0.25 mg, 0.25 mg, Intravenous,  Once, 6 of 7 cycles Administration: 0.25 mg (11/21/2017), 0.25 mg (11/29/2017), 0.25 mg (12/05/2017), 0.25 mg (12/21/2017), 0.25 mg (12/28/2017), 0.25 mg (01/04/2018), 0.25 mg (01/18/2018), 0.25 mg (01/25/2018), 0.25 mg (02/01/2018), 0.25 mg (02/15/2018), 0.25 mg (02/22/2018), 0.25 mg (03/01/2018), 0.25 mg (03/15/2018), 0.25 mg (03/22/2018), 0.25 mg (03/30/2018), 0.25 mg (04/13/2018), 0.25 mg (04/20/2018), 0.25 mg  (04/27/2018) cyclophosphamide (CYTOXAN) 500 mg in sodium chloride 0.9 % 250 mL chemo infusion, 540 mg, Intravenous,  Once, 6 of 7 cycles Administration: 500 mg (11/21/2017), 500 mg (11/29/2017), 500 mg (12/05/2017), 500 mg (12/28/2017), 500 mg (01/04/2018), 500 mg (01/18/2018), 500 mg (01/25/2018), 500 mg (02/01/2018), 500 mg (02/15/2018), 500 mg (02/22/2018), 500 mg (03/01/2018), 500 mg (03/15/2018), 500 mg (03/30/2018), 500 mg (04/13/2018), 500 mg (04/20/2018), 500 mg (04/27/2018) carfilzomib (KYPROLIS) 36 mg in dextrose 5 % 50 mL chemo infusion, 20 mg/m2 = 36 mg, Intravenous, Once, 6 of 7 cycles Administration: 36 mg (11/21/2017), 36 mg (11/22/2017), 60 mg (11/29/2017), 60 mg (11/30/2017), 60 mg (12/05/2017), 60 mg (12/06/2017), 60 mg (12/21/2017), 60 mg (12/22/2017), 60 mg (12/28/2017), 60 mg (12/29/2017), 60 mg (01/04/2018), 60 mg (01/05/2018), 60 mg (01/18/2018), 60 mg (01/19/2018), 60 mg (01/25/2018), 60 mg (01/27/2018), 60 mg (02/01/2018), 60 mg (02/02/2018), 60 mg (02/15/2018), 60 mg (02/16/2018), 60 mg (02/22/2018), 60 mg (03/01/2018), 60 mg (03/02/2018), 60 mg (03/15/2018), 60 mg (03/16/2018), 60 mg (03/23/2018), 60 mg (03/30/2018), 60 mg (03/31/2018), 60 mg (04/13/2018), 60 mg (04/14/2018), 60 mg (04/20/2018), 60 mg (04/21/2018), 60 mg (04/27/2018)  for chemotherapy treatment.        INTERVAL HISTORY:  Brandi NOVINGER89y.o.  female pleasant patient above history of relapsed multiple myeloma -currently status post autologous stem cell transplant-on November 21.   Diagnosed with flu earlier in the week.  Chest x-ray was negative for any acute process.  Patient currently on Tamiflu.  Patient's fevers/myalgias improved.  [facility] has not been started on maintenance therapy yet.  She continues to have mild chronic cough which is dry.  Also admits to intermittent wheezing.  She continues to have 1-2 loose stools a day.  Not any worse.   Review of Systems  Constitutional: Positive  for malaise/fatigue.  Negative for chills, diaphoresis, fever and weight loss.       Weight gain.  HENT: Negative for nosebleeds and sore throat.   Eyes: Negative for double vision.  Respiratory: Positive for cough and wheezing. Negative for hemoptysis, sputum production and shortness of breath.   Cardiovascular: Negative for chest pain, palpitations and orthopnea.  Gastrointestinal: Negative for abdominal pain, blood in stool, constipation, heartburn, melena and vomiting.  Genitourinary: Negative for dysuria, frequency and urgency.  Musculoskeletal: Positive for back pain, joint pain and myalgias.  Skin: Negative.  Negative for itching and rash.  Neurological: Negative for dizziness, tingling, focal weakness, weakness and headaches.  Endo/Heme/Allergies: Does not bruise/bleed easily.  Psychiatric/Behavioral: Negative for depression. The patient is not nervous/anxious and does not have insomnia.       PAST MEDICAL HISTORY :  Past Medical History:  Diagnosis Date  . Hypertension   . Multiple myeloma ([place])     PAST SURGICAL HISTORY :   Past Surgical History:  Procedure Laterality Date  . IR FLUORO GUIDE PORT INSERTION RIGHT  12/16/2017    FAMILY HISTORY :   Family History  Problem Relation Age of Onset  . Pneumonia Mother   . Seizures Father     SOCIAL HISTORY:   Social History   Tobacco Use  . Smoking status: Current Every Day Smoker    Packs/day: 0.50    Types: Cigarettes  . Smokeless tobacco: Never Used  Substance Use Topics  . Alcohol use: No  . Drug use: No    ALLERGIES:  has No Known Allergies.  MEDICATIONS:  Current Outpatient Medications  Medication Sig Dispense Refill  . acyclovir (ZOVIRAX) 400 MG tablet One pill a day [to prevent shingles] 30 tablet 6  . albuterol (PROVENTIL HFA;VENTOLIN HFA) 108 (90 Base) MCG/ACT inhaler Inhale 2 puffs into the lungs every 6 (six) hours as needed for wheezing or shortness of breath. 1 Inhaler 2  . amLODipine (NORVASC) 10 MG tablet Take 1  tablet (10 mg total) by mouth daily. 30 tablet 4  . ergocalciferol (VITAMIN D2) 1.25 MG (50000 UT) capsule Take 1 capsule (50,000 Units total) by mouth once a week. 12 capsule 1  . feeding supplement, ENSURE ENLIVE, (ENSURE ENLIVE) LIQD Take 237 mLs by mouth 2 (two) times daily between meals. 60 Bottle 0  . oseltamivir (TAMIFLU) 75 MG capsule Take 1 capsule (75 mg total) by mouth 2 (two) times daily. 10 capsule 0  . pantoprazole (PROTONIX) 40 MG tablet Take 1 tablet (40 mg total) by mouth daily. 90 tablet 3  . polyethylene glycol (MIRALAX / GLYCOLAX) packet Take 17 g by mouth daily as needed for mild constipation. 14 each 0  . potassium chloride SA (K-DUR,KLOR-CON) 20 MEQ tablet Take 1 tablet (20 mEq total) by mouth 2 (two) times daily. 14 tablet 1  . prochlorperazine (COMPAZINE) 10 MG tablet Take 10 mg by mouth every 6 (six) hours as needed for nausea or vomiting.    . ranitidine (ZANTAC) 150 MG tablet Take 1 tablet (150 mg total) by mouth 2 (two) times daily. START TAKING 2 DAYS PRIOR TO CHEMOTHERAPY. TAKE FOR A TOTAL OF 4 DAYS    . rivaroxaban (XARELTO) 20 MG TABS tablet Take 1 tablet (20 mg total) by mouth daily with supper. Start after finishing your "starter pack". 30 tablet 2  . traMADol (ULTRAM) 50 MG tablet Take 1 tablet (50 mg total) by mouth every 6 (six) hours as needed. 60 tablet 0  . ondansetron (  ZOFRAN) 8 MG tablet One pill every 8 hours as needed for nausea/vomitting. (Patient not taking: Reported on 09/01/2018) 40 tablet 1   No current facility-administered medications for this visit.    Facility-Administered Medications Ordered in Other Visits  Medication Dose Route Frequency Provider Last Rate Last Dose  . sodium chloride flush (NS) 0.9 % injection 10 mL  10 mL Intracatheter PRN [name], MD   10 mL at 02/23/18 1400    PHYSICAL EXAMINATION: ECOG PERFORMANCE STATUS: 1 - Symptomatic but completely ambulatory  BP 116/80 (BP Location: Left Arm, Patient Position:  Sitting)   Pulse 94   Temp 97.9 F (36.6 C) (Tympanic)   Resp 20   Ht '5\' 1"'  (1.549 m)   Wt 153 lb (69.4 kg)   BMI 28.91 kg/m   Filed Weights   09/01/18 0858  Weight: 153 lb (69.4 kg)    Physical Exam  Constitutional: She is oriented to person, place, and time and well-developed, well-nourished, and in no distress.  Alone.   HENT:  Head: Normocephalic and atraumatic.  Mild neck swelling mildly tender.  No masses felt.  Eyes: Pupils are equal, round, and reactive to light.  Neck: Normal range of motion. Neck supple.  Cardiovascular: Normal rate and regular rhythm.  Pulmonary/Chest: Breath sounds normal. No respiratory distress. She has no wheezes.  Scattered wheeze bil.   Abdominal: Soft. Bowel sounds are normal. She exhibits no distension and no mass. There is no abdominal tenderness. There is no rebound and no guarding.  Musculoskeletal: Normal range of motion.        General: No tenderness.  Neurological: She is alert and oriented to person, place, and time.  Skin: Skin is warm.  Psychiatric: Affect normal.       LABORATORY DATA:  I have reviewed the data as listed    Component Value Date/Time   NA 136 08/30/2018 0843   K 3.4 (L) 09/01/2018 0828   CL 108 08/30/2018 0843   CO2 21 (L) 08/30/2018 0843   GLUCOSE 107 (H) 08/30/2018 0843   BUN 15 08/30/2018 0843   CREATININE 0.69 08/30/2018 0843   CALCIUM 8.6 (L) 08/30/2018 0843   PROT 8.1 08/30/2018 0843   ALBUMIN 4.6 08/30/2018 0843   AST 19 08/30/2018 0843   ALT 12 08/30/2018 0843   ALKPHOS 49 08/30/2018 0843   BILITOT 0.3 08/30/2018 0843   GFRNONAA >60 08/30/2018 0843   GFRAA >60 08/30/2018 0843    No results found for: SPEP, UPEP  Lab Results  Component Value Date   WBC 5.7 08/30/2018   NEUTROABS 1.1 (L) 08/30/2018   HGB 13.4 08/30/2018   HCT 41.8 08/30/2018   MCV 89.5 08/30/2018   PLT 168 08/30/2018      Chemistry      Component Value Date/Time   NA 136 08/30/2018 0843   K 3.4 (L) 09/01/2018  0828   CL 108 08/30/2018 0843   CO2 21 (L) 08/30/2018 0843   BUN 15 08/30/2018 0843   CREATININE 0.69 08/30/2018 0843      Component Value Date/Time   CALCIUM 8.6 (L) 08/30/2018 0843   ALKPHOS 49 08/30/2018 0843   AST 19 08/30/2018 0843   ALT 12 08/30/2018 0843   BILITOT 0.3 08/30/2018 0843       RADIOGRAPHIC STUDIES: I have personally reviewed the radiological images as listed and agreed with the findings in the report. Dg Chest 2 View  Result Date: 08/30/2018 CLINICAL DATA:  Shortness of breath. EXAM: CHEST -  2 VIEW COMPARISON:  Radiographs of May 01, 2018. FINDINGS: The heart size and mediastinal contours are within normal limits. Both lungs are clear. No pneumothorax or pleural effusion is noted. Atherosclerosis of thoracic aorta is noted. Right internal jugular Port-A-Cath has been removed. The visualized skeletal structures are unremarkable. IMPRESSION: No active cardiopulmonary disease. Aortic Atherosclerosis (ICD10-I70.0). Electronically Signed   By: Marijo Conception, M.D.   On: 08/30/2018 11:53     ASSESSMENT & PLAN:  Multiple myeloma in relapse [facility]) #Multiple myeloma stage III high-risk cytogenetics-status post KRD-VG PR ; status post autologousstem cell transplant on 06/15/18.   # + Multiple myeloma clinically stable. Awaiting re-eval at [place] in 1 month with regards recommendations for maintenance therapy.  # Recent flu-not complicated.  Continue Tamiflu; recommend albuterol 4-6 hours prn.   # Hypokalemia-today potassium is 3.4.  Continue potassium 20 mg a day.  # Right jugular DVT- continue Xarelto 20 mg a day [x 3 months]  # Infection prophylaxis: Continue acyclovir; stable.   # DISPOSITION: # no infusion today # follow up in 3 weeks/labs- cbc/cmp-Dr.B  Orders Placed This Encounter  Procedures  . Comprehensive metabolic panel    Standing Status:   Future    Standing Expiration Date:   09/02/2019  . CBC with Differential    Standing Status:   Future     Standing Expiration Date:   09/02/2019   All questions were answered. The patient knows to call the clinic with any problems, questions or concerns.      [name], MD 09/01/2018 11:28 AM

## 2018-09-22 ENCOUNTER — Other Ambulatory Visit: Payer: Self-pay

## 2018-09-22 ENCOUNTER — Inpatient Hospital Stay: Payer: Medicare Other

## 2018-09-22 ENCOUNTER — Encounter: Payer: Self-pay | Admitting: Internal Medicine

## 2018-09-22 ENCOUNTER — Inpatient Hospital Stay (HOSPITAL_BASED_OUTPATIENT_CLINIC_OR_DEPARTMENT_OTHER): Payer: Medicare Other | Admitting: Internal Medicine

## 2018-09-22 VITALS — BP 108/73 | HR 82 | Temp 97.6°F | Resp 20 | Ht 61.0 in | Wt 152.0 lb

## 2018-09-22 DIAGNOSIS — C9002 Multiple myeloma in relapse: Secondary | ICD-10-CM

## 2018-09-22 DIAGNOSIS — I82C11 Acute embolism and thrombosis of right internal jugular vein: Secondary | ICD-10-CM

## 2018-09-22 DIAGNOSIS — E876 Hypokalemia: Secondary | ICD-10-CM

## 2018-09-22 DIAGNOSIS — R05 Cough: Secondary | ICD-10-CM

## 2018-09-22 DIAGNOSIS — R070 Pain in throat: Secondary | ICD-10-CM

## 2018-09-22 DIAGNOSIS — I10 Essential (primary) hypertension: Secondary | ICD-10-CM

## 2018-09-22 DIAGNOSIS — Z7901 Long term (current) use of anticoagulants: Secondary | ICD-10-CM

## 2018-09-22 DIAGNOSIS — Z9484 Stem cells transplant status: Secondary | ICD-10-CM

## 2018-09-22 LAB — CBC WITH DIFFERENTIAL/PLATELET
Abs Immature Granulocytes: 0.01 10*3/uL (ref 0.00–0.07)
BASOS PCT: 0 %
Basophils Absolute: 0 10*3/uL (ref 0.0–0.1)
Eosinophils Absolute: 0.3 10*3/uL (ref 0.0–0.5)
Eosinophils Relative: 4 %
HCT: 35.7 % — ABNORMAL LOW (ref 36.0–46.0)
Hemoglobin: 11.7 g/dL — ABNORMAL LOW (ref 12.0–15.0)
Immature Granulocytes: 0 %
Lymphocytes Relative: 38 %
Lymphs Abs: 2.3 10*3/uL (ref 0.7–4.0)
MCH: 30.3 pg (ref 26.0–34.0)
MCHC: 32.8 g/dL (ref 30.0–36.0)
MCV: 92.5 fL (ref 80.0–100.0)
Monocytes Absolute: 0.4 10*3/uL (ref 0.1–1.0)
Monocytes Relative: 6 %
Neutro Abs: 3.1 10*3/uL (ref 1.7–7.7)
Neutrophils Relative %: 52 %
Platelets: 250 10*3/uL (ref 150–400)
RBC: 3.86 MIL/uL — ABNORMAL LOW (ref 3.87–5.11)
RDW: 14.6 % (ref 11.5–15.5)
WBC: 6.1 10*3/uL (ref 4.0–10.5)
nRBC: 0 % (ref 0.0–0.2)

## 2018-09-22 LAB — COMPREHENSIVE METABOLIC PANEL
ALT: 9 U/L (ref 0–44)
AST: 15 U/L (ref 15–41)
Albumin: 4.7 g/dL (ref 3.5–5.0)
Alkaline Phosphatase: 40 U/L (ref 38–126)
Anion gap: 8 (ref 5–15)
BILIRUBIN TOTAL: 0.7 mg/dL (ref 0.3–1.2)
BUN: 12 mg/dL (ref 8–23)
CO2: 22 mmol/L (ref 22–32)
Calcium: 8.7 mg/dL — ABNORMAL LOW (ref 8.9–10.3)
Chloride: 109 mmol/L (ref 98–111)
Creatinine, Ser: 0.66 mg/dL (ref 0.44–1.00)
GFR calc Af Amer: >60 mL/min (ref >60)
Glucose, Bld: 105 mg/dL — ABNORMAL HIGH (ref 70–99)
Potassium: 3.2 mmol/L — ABNORMAL LOW (ref 3.5–5.1)
Sodium: 139 mmol/L (ref 135–145)
TOTAL PROTEIN: 7.8 g/dL (ref 6.5–8.1)

## 2018-09-22 NOTE — Progress Notes (Signed)
[place] OFFICE PROGRESS NOTE  Patient Care Team: Center, [facility] as PCP - General ([place]) [name], [name]  Cancer Staging No matching staging information was found for the patient.   Oncology History   # SEP 2018- MULTIPLE MYELOMA IgALamda [2.5 gm/dl; K/L= 88/1298]; STAGE III [beta 2 microglobulin=5.5] [presented with acute renal failure; anemia; NO hypercalcemia; Skeletal survey-Normal]; BMBx- 45% plasma cells; FISH-POSITIVE 11:14 translocation.[STANDARD-high RISK]/cyto-Normal; SEP 2018- PET- L3 posterior element lesion.   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       [place]   11/07/2017 -  Chemotherapy    The patient had palonosetron (ALOXI) injection 0.25 mg, 0.25 mg, Intravenous,  Once, 6 of 7 cycles Administration: 0.25 mg (11/21/2017), 0.25 mg (11/29/2017), 0.25 mg (12/05/2017), 0.25 mg (12/21/2017), 0.25 mg (12/28/2017), 0.25 mg (01/04/2018), 0.25 mg (01/18/2018), 0.25 mg (01/25/2018), 0.25 mg (02/01/2018), 0.25 mg (02/15/2018), 0.25 mg (02/22/2018), 0.25 mg (03/01/2018), 0.25 mg (03/15/2018), 0.25 mg (03/22/2018), 0.25 mg (03/30/2018), 0.25 mg (04/13/2018), 0.25 mg (04/20/2018), 0.25 mg  (04/27/2018) cyclophosphamide (CYTOXAN) 500 mg in sodium chloride 0.9 % 250 mL chemo infusion, 540 mg, Intravenous,  Once, 6 of 7 cycles Administration: 500 mg (11/21/2017), 500 mg (11/29/2017), 500 mg (12/05/2017), 500 mg (12/28/2017), 500 mg (01/04/2018), 500 mg (01/18/2018), 500 mg (01/25/2018), 500 mg (02/01/2018), 500 mg (02/15/2018), 500 mg (02/22/2018), 500 mg (03/01/2018), 500 mg (03/15/2018), 500 mg (03/30/2018), 500 mg (04/13/2018), 500 mg (04/20/2018), 500 mg (04/27/2018) carfilzomib (KYPROLIS) 36 mg in dextrose 5 % 50 mL chemo infusion, 20 mg/m2 = 36 mg, Intravenous, Once, 6 of 7 cycles Administration: 36 mg (11/21/2017), 36 mg (11/22/2017), 60 mg (11/29/2017), 60 mg (11/30/2017), 60 mg (12/05/2017), 60 mg (12/06/2017), 60 mg (12/21/2017), 60 mg (12/22/2017), 60 mg (12/28/2017), 60 mg (12/29/2017), 60 mg (01/04/2018), 60 mg (01/05/2018), 60 mg (01/18/2018), 60 mg (01/19/2018), 60 mg (01/25/2018), 60 mg (01/27/2018), 60 mg (02/01/2018), 60 mg (02/02/2018), 60 mg (02/15/2018), 60 mg (02/16/2018), 60 mg (02/22/2018), 60 mg (03/01/2018), 60 mg (03/02/2018), 60 mg (03/15/2018), 60 mg (03/16/2018), 60 mg (03/23/2018), 60 mg (03/30/2018), 60 mg (03/31/2018), 60 mg (04/13/2018), 60 mg (04/14/2018), 60 mg (04/20/2018), 60 mg (04/21/2018), 60 mg (04/27/2018)  for chemotherapy treatment.        INTERVAL HISTORY:  Brandi MULDROW89y.o.  female pleasant patient above history of relapsed multiple myeloma -currently status post stem cell transplant on November 21 is here for follow-up.  In the interim patient was evaluated by [facility] approximately 3 weeks ago; she is awaiting repeat appointment a month from now to discuss maintenance therapy.  Patient complains of mild dry cough itching of the throat.  Phlegm.  No fevers.  Otherwise no significant shortness of breath or chest pain.  Complains of mild fatigue.   Review of Systems  Constitutional: Positive for malaise/fatigue. Negative for chills, diaphoresis, fever and weight loss.  Weight gain.  HENT:  Negative for nosebleeds and sore throat.   Eyes: Negative for double vision.  Respiratory: Positive for cough. Negative for hemoptysis, sputum production and shortness of breath.   Cardiovascular: Negative for chest pain, palpitations and orthopnea.  Gastrointestinal: Negative for abdominal pain, blood in stool, constipation, heartburn, melena and vomiting.  Genitourinary: Negative for dysuria, frequency and urgency.  Skin: Negative.  Negative for itching and rash.  Neurological: Negative for dizziness, tingling, focal weakness, weakness and headaches.  Endo/Heme/Allergies: Does not bruise/bleed easily.  Psychiatric/Behavioral: Negative for depression. The patient is not nervous/anxious and does not have insomnia.       PAST MEDICAL HISTORY :  Past Medical History:  Diagnosis Date  . Hypertension   . Multiple myeloma ([place])     PAST SURGICAL HISTORY :   Past Surgical History:  Procedure Laterality Date  . IR FLUORO GUIDE PORT INSERTION RIGHT  12/16/2017    FAMILY HISTORY :   Family History  Problem Relation Age of Onset  . Pneumonia Mother   . Seizures Father     SOCIAL HISTORY:   Social History   Tobacco Use  . Smoking status: Current Every Day Smoker    Packs/day: 0.50    Types: Cigarettes  . Smokeless tobacco: Never Used  Substance Use Topics  . Alcohol use: No  . Drug use: No    ALLERGIES:  has No Known Allergies.  MEDICATIONS:  Current Outpatient Medications  Medication Sig Dispense Refill  . acyclovir (ZOVIRAX) 400 MG tablet One pill a day [to prevent shingles] 30 tablet 6  . albuterol (PROVENTIL HFA;VENTOLIN HFA) 108 (90 Base) MCG/ACT inhaler Inhale 2 puffs into the lungs every 6 (six) hours as needed for wheezing or shortness of breath. 1 Inhaler 2  . amLODipine (NORVASC) 10 MG tablet Take 1 tablet (10 mg total) by mouth daily. 30 tablet 4  . ergocalciferol (VITAMIN D2) 1.25 MG (50000 UT) capsule Take 1 capsule (50,000 Units total) by mouth once a week. 12  capsule 1  . ondansetron (ZOFRAN) 8 MG tablet One pill every 8 hours as needed for nausea/vomitting. 40 tablet 1  . pantoprazole (PROTONIX) 40 MG tablet Take 1 tablet (40 mg total) by mouth daily. 90 tablet 3  . potassium chloride SA (K-DUR,KLOR-CON) 20 MEQ tablet Take 1 tablet (20 mEq total) by mouth 2 (two) times daily. 14 tablet 1  . prochlorperazine (COMPAZINE) 10 MG tablet Take 10 mg by mouth every 6 (six) hours as needed for nausea or vomiting.    . rivaroxaban (XARELTO) 20 MG TABS tablet Take 1 tablet (20 mg total) by mouth daily with supper. Start after finishing your "starter pack". 30 tablet 2  . traMADol (ULTRAM) 50 MG tablet Take 1 tablet (50 mg total) by mouth every 6 (six) hours as needed. 60 tablet 0  . feeding supplement, ENSURE ENLIVE, (ENSURE ENLIVE) LIQD Take 237 mLs by mouth 2 (two) times daily between meals. (Patient not taking: Reported on 09/22/2018) 60 Bottle 0  . polyethylene glycol (MIRALAX / GLYCOLAX) packet Take 17 g by mouth daily as needed for mild constipation. (Patient not taking: Reported on 09/22/2018) 14 each 0   No current facility-administered medications for this visit.    Facility-Administered Medications Ordered in Other Visits  Medication Dose Route Frequency Provider Last Rate Last Dose  . sodium chloride flush (NS) 0.9 % injection 10 mL  10 mL Intracatheter PRN [name], MD   10 mL at 02/23/18 1400  PHYSICAL EXAMINATION: ECOG PERFORMANCE STATUS: 1 - Symptomatic but completely ambulatory  BP 108/73   Pulse 82   Temp 97.6 F (36.4 C) (Tympanic)   Resp 20   Ht 5' 1" (1.549 m)   Wt 152 lb (68.9 kg)   BMI 28.72 kg/m   Filed Weights   09/22/18 1018  Weight: 152 lb (68.9 kg)    Physical Exam  Constitutional: She is oriented to person, place, and time and well-developed, well-nourished, and in no distress.  Accompanied by daughter.  She is walking herself.  HENT:  Head: Normocephalic and atraumatic.  Eyes: Pupils are equal,  round, and reactive to light.  Neck: Normal range of motion. Neck supple.  Cardiovascular: Normal rate and regular rhythm.  Pulmonary/Chest: No respiratory distress. She has no wheezes.  No wheezing slightly low breath sounds on the left lower base.  Abdominal: Soft. Bowel sounds are normal. She exhibits no distension and no mass. There is no abdominal tenderness. There is no rebound and no guarding.  Musculoskeletal: Normal range of motion.        General: No tenderness.  Neurological: She is alert and oriented to person, place, and time.  Skin: Skin is warm.  Psychiatric: Affect normal.       LABORATORY DATA:  I have reviewed the data as listed    Component Value Date/Time   NA 139 09/22/2018 0914   K 3.2 (L) 09/22/2018 0914   CL 109 09/22/2018 0914   CO2 22 09/22/2018 0914   GLUCOSE 105 (H) 09/22/2018 0914   BUN 12 09/22/2018 0914   CREATININE 0.66 09/22/2018 0914   CALCIUM 8.7 (L) 09/22/2018 0914   PROT 7.8 09/22/2018 0914   ALBUMIN 4.7 09/22/2018 0914   AST 15 09/22/2018 0914   ALT 9 09/22/2018 0914   ALKPHOS 40 09/22/2018 0914   BILITOT 0.7 09/22/2018 0914   GFRNONAA >60 09/22/2018 0914   GFRAA >60 09/22/2018 0914    No results found for: SPEP, UPEP  Lab Results  Component Value Date   WBC 6.1 09/22/2018   NEUTROABS 3.1 09/22/2018   HGB 11.7 (L) 09/22/2018   HCT 35.7 (L) 09/22/2018   MCV 92.5 09/22/2018   PLT 250 09/22/2018      Chemistry      Component Value Date/Time   NA 139 09/22/2018 0914   K 3.2 (L) 09/22/2018 0914   CL 109 09/22/2018 0914   CO2 22 09/22/2018 0914   BUN 12 09/22/2018 0914   CREATININE 0.66 09/22/2018 0914      Component Value Date/Time   CALCIUM 8.7 (L) 09/22/2018 0914   ALKPHOS 40 09/22/2018 0914   AST 15 09/22/2018 0914   ALT 9 09/22/2018 0914   BILITOT 0.7 09/22/2018 0914       RADIOGRAPHIC STUDIES: I have personally reviewed the radiological images as listed and agreed with the findings in the report. No results  found.   ASSESSMENT & PLAN:  Multiple myeloma in relapse [facility]) #Multiple myeloma stage III high-risk cytogenetics-status post KRD-VG PR ; status post autologousstem cell transplant on 06/15/18.   #Multiple myeloma clinically stable.  Awaiting repeat appointment in mid March at [facility] regarding maintenance therapies.  Defer to [facility] regarding reevaluation with repeat bone marrow biopsy.  #Cough with/sore throat suspect URI; pulse ox 97% on room air.  If worse recommend get a chest x-ray for now recommend Mucinex.  # Hypokalemia-today potassium is 3.2; continue home supplementation.  Discussed compliance.  # Right jugular DVT- continue Xarelto 20  mg a day [x 3 months]; will consider discontinuation of Xarelto at next visit.  # Infection prophylaxis: Continue acyclovir; STABLE.   # DISPOSITION: # follow up in 3/24th-MD/labs-cbc/cmp-Dr.B  No orders of the defined types were placed in this encounter.  All questions were answered. The patient knows to call the clinic with any problems, questions or concerns.      [name], MD 09/22/2018 1:15 PM

## 2018-09-22 NOTE — Assessment & Plan Note (Addendum)
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   #Multiple myeloma clinically stable.  Awaiting repeat appointment in mid March at [facility] regarding maintenance therapies.  Defer to [facility] regarding reevaluation with repeat bone marrow biopsy.  #Cough with/sore throat suspect URI; pulse ox 97% on room air.  If worse recommend get a chest x-ray for now recommend Mucinex.  # Hypokalemia-today potassium is 3.2; continue home supplementation.  Discussed compliance.  # Right jugular DVT- continue Xarelto 20 mg a day [x 3 months]; will consider discontinuation of Xarelto at next visit.  # Infection prophylaxis: Continue acyclovir; STABLE.   # DISPOSITION: # follow up in 3/24th-MD/labs-cbc/cmp-Dr.B

## 2018-10-03 ENCOUNTER — Other Ambulatory Visit: Payer: Self-pay | Admitting: Internal Medicine

## 2018-10-03 DIAGNOSIS — C9002 Multiple myeloma in relapse: Secondary | ICD-10-CM

## 2018-10-16 ENCOUNTER — Other Ambulatory Visit: Payer: Self-pay

## 2018-10-17 ENCOUNTER — Encounter: Payer: Self-pay | Admitting: Internal Medicine

## 2018-10-17 ENCOUNTER — Other Ambulatory Visit: Payer: Self-pay

## 2018-10-17 ENCOUNTER — Inpatient Hospital Stay: Payer: Medicare Other | Attending: Internal Medicine

## 2018-10-17 ENCOUNTER — Inpatient Hospital Stay (HOSPITAL_BASED_OUTPATIENT_CLINIC_OR_DEPARTMENT_OTHER): Payer: Medicare Other | Admitting: Internal Medicine

## 2018-10-17 VITALS — BP 116/83 | HR 86 | Temp 97.1°F | Resp 16 | Wt 153.0 lb

## 2018-10-17 DIAGNOSIS — Z9484 Stem cells transplant status: Secondary | ICD-10-CM | POA: Diagnosis not present

## 2018-10-17 DIAGNOSIS — Z7901 Long term (current) use of anticoagulants: Secondary | ICD-10-CM | POA: Diagnosis not present

## 2018-10-17 DIAGNOSIS — E876 Hypokalemia: Secondary | ICD-10-CM | POA: Diagnosis not present

## 2018-10-17 DIAGNOSIS — F17210 Nicotine dependence, cigarettes, uncomplicated: Secondary | ICD-10-CM | POA: Insufficient documentation

## 2018-10-17 DIAGNOSIS — I82C11 Acute embolism and thrombosis of right internal jugular vein: Secondary | ICD-10-CM

## 2018-10-17 DIAGNOSIS — C9002 Multiple myeloma in relapse: Secondary | ICD-10-CM | POA: Diagnosis present

## 2018-10-17 LAB — CBC WITH DIFFERENTIAL/PLATELET
Abs Immature Granulocytes: 0.01 10*3/uL (ref 0.00–0.07)
Basophils Absolute: 0.1 10*3/uL (ref 0.0–0.1)
Basophils Relative: 1 %
Eosinophils Absolute: 0.4 10*3/uL (ref 0.0–0.5)
Eosinophils Relative: 7 %
HCT: 36.6 % (ref 36.0–46.0)
Hemoglobin: 11.7 g/dL — ABNORMAL LOW (ref 12.0–15.0)
Immature Granulocytes: 0 %
Lymphocytes Relative: 48 %
Lymphs Abs: 2.8 10*3/uL (ref 0.7–4.0)
MCH: 30.4 pg (ref 26.0–34.0)
MCHC: 32 g/dL (ref 30.0–36.0)
MCV: 95.1 fL (ref 80.0–100.0)
Monocytes Absolute: 0.3 10*3/uL (ref 0.1–1.0)
Monocytes Relative: 5 %
NEUTROS PCT: 39 %
NRBC: 0 % (ref 0.0–0.2)
Neutro Abs: 2.3 10*3/uL (ref 1.7–7.7)
Platelets: 271 10*3/uL (ref 150–400)
RBC: 3.85 MIL/uL — AB (ref 3.87–5.11)
RDW: 14.4 % (ref 11.5–15.5)
WBC: 5.9 10*3/uL (ref 4.0–10.5)

## 2018-10-17 LAB — COMPREHENSIVE METABOLIC PANEL
ALT: 7 U/L (ref 0–44)
ANION GAP: 6 (ref 5–15)
AST: 12 U/L — ABNORMAL LOW (ref 15–41)
Albumin: 4.5 g/dL (ref 3.5–5.0)
Alkaline Phosphatase: 50 U/L (ref 38–126)
BUN: 16 mg/dL (ref 8–23)
CO2: 25 mmol/L (ref 22–32)
Calcium: 9.4 mg/dL (ref 8.9–10.3)
Chloride: 107 mmol/L (ref 98–111)
Creatinine, Ser: 0.76 mg/dL (ref 0.44–1.00)
GFR calc Af Amer: >60 mL/min (ref >60)
GFR calc non Af Amer: >60 mL/min (ref >60)
Glucose, Bld: 103 mg/dL — ABNORMAL HIGH (ref 70–99)
Potassium: 3.6 mmol/L (ref 3.5–5.1)
Sodium: 138 mmol/L (ref 135–145)
Total Bilirubin: 0.4 mg/dL (ref 0.3–1.2)
Total Protein: 7.9 g/dL (ref 6.5–8.1)

## 2018-10-17 LAB — MAGNESIUM: Magnesium: 2.1 mg/dL (ref 1.7–2.4)

## 2018-10-17 LAB — PHOSPHORUS: Phosphorus: 3.8 mg/dL (ref 2.5–4.6)

## 2018-10-17 NOTE — Assessment & Plan Note (Addendum)
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   #Status post reevaluation at [facility] 2 weeks ago; no bone marrow biopsy done.  Recommend maintenance low-dose Revlimid.  # Given Covid pandemic-I think it is reasonable to hold off starting any maintenance therapy at this time.  Patient agrees.  We will start Zometa at next visit.  # Hypokalemia-23.6.  Continue potassium pill 1 a day.  # Right jugular DVT- continue Xarelto 20 mg a day [x 3 months]; will STOP in 1 st week of April 2020.  #Infection prophylaxis continue acyclovir; Ok to receive flu and pneumonia vaccines in 3 months at home; in 9 months [facility]/vaccinations.  # # Educated the patient regarding novel coronavirus-modes of transmission/risks; and measures to avoid infection.    # DISPOSITION: # follow up in 6 weeks-MD/labs- cbc/cmp/MM panel; k-l light chains; Zometa-Dr.B

## 2018-10-17 NOTE — Progress Notes (Signed)
Brandi [name]  Patient Care Team: Center, [facility] as PCP - General ([place]) [name], [name]  Cancer Staging No matching staging information was found for the patient.   Oncology History   # SEP 2018- MULTIPLE MYELOMA IgALamda [2.5 gm/dl; K/L= 88/1298]; STAGE III [beta 2 microglobulin=5.5] [presented with acute renal failure; anemia; NO hypercalcemia; Skeletal survey-Normal]; BMBx- 45% plasma cells; FISH-POSITIVE 11:14 translocation.[STANDARD-high RISK]/cyto-Normal; SEP 2018- PET- L3 posterior element lesion.   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       [place]   11/07/2017 -  Chemotherapy    The patient had palonosetron (ALOXI) injection 0.25 mg, 0.25 mg, Intravenous,  Once, 6 of 7 cycles Administration: 0.25 mg (11/21/2017), 0.25 mg (11/29/2017), 0.25 mg (12/05/2017), 0.25 mg (12/21/2017), 0.25 mg (12/28/2017), 0.25 mg (01/04/2018), 0.25 mg (01/18/2018), 0.25 mg (01/25/2018), 0.25 mg (02/01/2018), 0.25 mg (02/15/2018), 0.25 mg (02/22/2018), 0.25 mg (03/01/2018), 0.25 mg (03/15/2018), 0.25 mg (03/22/2018), 0.25 mg (03/30/2018), 0.25 mg (04/13/2018), 0.25 mg (04/20/2018), 0.25 mg  (04/27/2018) cyclophosphamide (CYTOXAN) 500 mg in sodium chloride 0.9 % 250 mL chemo infusion, 540 mg, Intravenous,  Once, 6 of 7 cycles Administration: 500 mg (11/21/2017), 500 mg (11/29/2017), 500 mg (12/05/2017), 500 mg (12/28/2017), 500 mg (01/04/2018), 500 mg (01/18/2018), 500 mg (01/25/2018), 500 mg (02/01/2018), 500 mg (02/15/2018), 500 mg (02/22/2018), 500 mg (03/01/2018), 500 mg (03/15/2018), 500 mg (03/30/2018), 500 mg (04/13/2018), 500 mg (04/20/2018), 500 mg (04/27/2018) carfilzomib (KYPROLIS) 36 mg in dextrose 5 % 50 mL chemo infusion, 20 mg/m2 = 36 mg, Intravenous, Once, 6 of 7 cycles Administration: 36 mg (11/21/2017), 36 mg (11/22/2017), 60 mg (11/29/2017), 60 mg (11/30/2017), 60 mg (12/05/2017), 60 mg (12/06/2017), 60 mg (12/21/2017), 60 mg (12/22/2017), 60 mg (12/28/2017), 60 mg (12/29/2017), 60 mg (01/04/2018), 60 mg (01/05/2018), 60 mg (01/18/2018), 60 mg (01/19/2018), 60 mg (01/25/2018), 60 mg (01/27/2018), 60 mg (02/01/2018), 60 mg (02/02/2018), 60 mg (02/15/2018), 60 mg (02/16/2018), 60 mg (02/22/2018), 60 mg (03/01/2018), 60 mg (03/02/2018), 60 mg (03/15/2018), 60 mg (03/16/2018), 60 mg (03/23/2018), 60 mg (03/30/2018), 60 mg (03/31/2018), 60 mg (04/13/2018), 60 mg (04/14/2018), 60 mg (04/20/2018), 60 mg (04/21/2018), 60 mg (04/27/2018)  for chemotherapy treatment.        INTERVAL HISTORY:  Brandi LONG89y.o.  female pleasant patient above history of relapsed multiple myeloma -currently status post stem cell transplant on November 21 is here for follow-up.  [facility] is recommended to start maintenance therapy.  Patient denies any fevers or chills.  Appetite is good but no weight loss.  No nausea no vomiting.   Review of Systems  Constitutional: Negative for chills, diaphoresis, fever and weight loss.  HENT: Negative for nosebleeds and sore throat.   Eyes: Negative for double vision.  Respiratory: Negative for hemoptysis, sputum production and shortness of  breath.   Cardiovascular: Negative for chest pain, palpitations and orthopnea.  Gastrointestinal: Negative for abdominal pain, blood in stool, constipation, heartburn, melena and vomiting.  Genitourinary: Negative for dysuria, frequency and urgency.  Skin: Negative.  Negative for itching and rash.  Neurological: Negative for dizziness, tingling, focal weakness, weakness and headaches.  Endo/Heme/Allergies: Does not bruise/bleed easily.  Psychiatric/Behavioral: Negative for depression. The patient is not nervous/anxious and does not have insomnia.       PAST MEDICAL HISTORY :  Past Medical History:  Diagnosis Date  . Hypertension   . Multiple myeloma ([place])     PAST SURGICAL HISTORY :   Past Surgical History:  Procedure Laterality Date  . IR FLUORO GUIDE PORT INSERTION RIGHT  12/16/2017    FAMILY HISTORY :   Family History  Problem Relation Age of Onset  . Pneumonia Mother   . Seizures Father     SOCIAL HISTORY:   Social History   Tobacco Use  . Smoking status: Current Every Day Smoker    Packs/day: 0.50    Types: Cigarettes  . Smokeless tobacco: Never Used  Substance Use Topics  . Alcohol use: No  . Drug use: No    ALLERGIES:  has No Known Allergies.  MEDICATIONS:  Current Outpatient Medications  Medication Sig Dispense Refill  . acyclovir (ZOVIRAX) 400 MG tablet TAKE 1 TABLET BY MOUTH ONCE DAILY TO  PREVENT  SHINGLES 30 tablet 0  . albuterol (PROVENTIL HFA;VENTOLIN HFA) 108 (90 Base) MCG/ACT inhaler Inhale 2 puffs into the lungs every 6 (six) hours as needed for wheezing or shortness of breath. 1 Inhaler 2  . amLODipine (NORVASC) 10 MG tablet Take 1 tablet (10 mg total) by mouth daily. 30 tablet 4  . ergocalciferol (VITAMIN D2) 1.25 MG (50000 UT) capsule Take 1 capsule (50,000 Units total) by mouth once a week. 12 capsule 1  . feeding supplement, ENSURE ENLIVE, (ENSURE ENLIVE) LIQD Take 237 mLs by mouth 2 (two) times daily between meals. 60 Bottle 0  . ondansetron  (ZOFRAN) 8 MG tablet One pill every 8 hours as needed for nausea/vomitting. 40 tablet 1  . pantoprazole (PROTONIX) 40 MG tablet Take 1 tablet (40 mg total) by mouth daily. 90 tablet 3  . polyethylene glycol (MIRALAX / GLYCOLAX) packet Take 17 g by mouth daily as needed for mild constipation. 14 each 0  . potassium chloride SA (K-DUR,KLOR-CON) 20 MEQ tablet Take 1 tablet (20 mEq total) by mouth 2 (two) times daily. 14 tablet 1  . prochlorperazine (COMPAZINE) 10 MG tablet Take 10 mg by mouth every 6 (six) hours as needed for nausea or vomiting.    . rivaroxaban (XARELTO) 20 MG TABS tablet Take 1 tablet (20 mg total) by mouth daily with supper. Start after finishing your "starter pack". 30 tablet 2  . traMADol (ULTRAM) 50 MG tablet Take 1 tablet (50 mg total) by mouth every 6 (six) hours as needed. 60 tablet 0   No current facility-administered medications for this visit.    Facility-Administered Medications Ordered in Other Visits  Medication Dose Route Frequency Provider Last Rate Last Dose  . sodium chloride flush (NS) 0.9 % injection 10 mL  10 mL Intracatheter PRN [name], MD   10 mL at 02/23/18 1400    PHYSICAL EXAMINATION: ECOG PERFORMANCE STATUS: 1 - Symptomatic but completely ambulatory  BP 116/83 (BP Location: Left Arm, Patient Position: Sitting, Cuff Size: Normal)   Pulse 86   Temp (!) 97.1 F (36.2 C) (Tympanic)  Resp 16   Wt 153 lb (69.4 kg)   BMI 28.91 kg/m   Filed Weights   10/17/18 1106  Weight: 153 lb (69.4 kg)    Physical Exam  Constitutional: She is oriented to person, place, and time and well-developed, well-nourished, and in no distress.  Alone. She is walking herself.  HENT:  Head: Normocephalic and atraumatic.  Eyes: Pupils are equal, round, and reactive to light.  Neck: Normal range of motion. Neck supple.  Cardiovascular: Normal rate and regular rhythm.  Pulmonary/Chest: No respiratory distress. She has no wheezes.  No wheezing slightly low  breath sounds on the left lower base.  Abdominal: Soft. Bowel sounds are normal. She exhibits no distension and no mass. There is no abdominal tenderness. There is no rebound and no guarding.  Musculoskeletal: Normal range of motion.        General: No tenderness.  Neurological: She is alert and oriented to person, place, and time.  Skin: Skin is warm.  Psychiatric: Affect normal.       LABORATORY DATA:  I have reviewed the data as listed    Component Value Date/Time   NA 138 10/17/2018 1037   K 3.6 10/17/2018 1037   CL 107 10/17/2018 1037   CO2 25 10/17/2018 1037   GLUCOSE 103 (H) 10/17/2018 1037   BUN 16 10/17/2018 1037   CREATININE 0.76 10/17/2018 1037   CALCIUM 9.4 10/17/2018 1037   PROT 7.9 10/17/2018 1037   ALBUMIN 4.5 10/17/2018 1037   AST 12 (L) 10/17/2018 1037   ALT 7 10/17/2018 1037   ALKPHOS 50 10/17/2018 1037   BILITOT 0.4 10/17/2018 1037   GFRNONAA >60 10/17/2018 1037   GFRAA >60 10/17/2018 1037    No results found for: SPEP, UPEP  Lab Results  Component Value Date   WBC 5.9 10/17/2018   NEUTROABS 2.3 10/17/2018   HGB 11.7 (L) 10/17/2018   HCT 36.6 10/17/2018   MCV 95.1 10/17/2018   PLT 271 10/17/2018      Chemistry      Component Value Date/Time   NA 138 10/17/2018 1037   K 3.6 10/17/2018 1037   CL 107 10/17/2018 1037   CO2 25 10/17/2018 1037   BUN 16 10/17/2018 1037   CREATININE 0.76 10/17/2018 1037      Component Value Date/Time   CALCIUM 9.4 10/17/2018 1037   ALKPHOS 50 10/17/2018 1037   AST 12 (L) 10/17/2018 1037   ALT 7 10/17/2018 1037   BILITOT 0.4 10/17/2018 1037       RADIOGRAPHIC STUDIES: I have personally reviewed the radiological images as listed and agreed with the findings in the report. No results found.   ASSESSMENT & PLAN:  Multiple myeloma in relapse [facility]) #Multiple myeloma stage III high-risk cytogenetics-status post KRD-VG PR ; status post autologousstem cell transplant on 06/15/18.   #Status post reevaluation  at [facility] 2 weeks ago; no bone marrow biopsy done.  Recommend maintenance low-dose Revlimid.  # Given Covid pandemic-I think it is reasonable to hold off starting any maintenance therapy at this time.  Patient agrees.  We will start Zometa at next visit.  # Hypokalemia-23.6.  Continue potassium pill 1 a day.  # Right jugular DVT- continue Xarelto 20 mg a day [x 3 months]; will STOP in 1 st week of April 2020.  #Infection prophylaxis continue acyclovir; Ok to receive flu and pneumonia vaccines in 3 months at home; in 9 months [facility]/vaccinations.  # DISPOSITION: # follow up in 6 weeks-MD/labs- cbc/cmp/MM  panel; k-l light chains; Zometa-Dr.B  No orders of the defined types were placed in this encounter.  All questions were answered. The patient knows to call the clinic with any problems, questions or concerns.      [name], MD 10/17/2018 12:31 PM

## 2018-10-17 NOTE — Patient Instructions (Signed)
#   STOP xarelto after finishing the current script- NO more refills- Dr.B

## 2018-11-24 ENCOUNTER — Telehealth: Payer: Self-pay | Admitting: *Deleted

## 2018-11-24 NOTE — Telephone Encounter (Signed)
Patient contacted regarding the need for a telehealth visit on Tuesday 11/28/2018.  She does not have a smart phone or a phone with text. She is only able to have a telephone visit that afternoon. She was instructed to come for her lab/zometa treatment and that dr. B would touch base with her on the phone later around 1pm that afternoon. She gave verbal understanding of the plan of care.

## 2018-11-28 ENCOUNTER — Encounter: Payer: Self-pay | Admitting: Internal Medicine

## 2018-11-28 ENCOUNTER — Other Ambulatory Visit: Payer: Self-pay

## 2018-11-28 ENCOUNTER — Telehealth: Payer: Self-pay | Admitting: Pharmacist

## 2018-11-28 ENCOUNTER — Inpatient Hospital Stay: Payer: Medicare Other | Attending: Internal Medicine

## 2018-11-28 ENCOUNTER — Other Ambulatory Visit: Payer: Self-pay | Admitting: *Deleted

## 2018-11-28 ENCOUNTER — Inpatient Hospital Stay (HOSPITAL_BASED_OUTPATIENT_CLINIC_OR_DEPARTMENT_OTHER): Payer: Medicare Other | Admitting: Internal Medicine

## 2018-11-28 ENCOUNTER — Telehealth: Payer: Self-pay | Admitting: Pharmacy Technician

## 2018-11-28 ENCOUNTER — Inpatient Hospital Stay: Payer: Medicare Other

## 2018-11-28 VITALS — BP 109/73 | HR 57 | Temp 97.6°F | Resp 18

## 2018-11-28 DIAGNOSIS — Z86718 Personal history of other venous thrombosis and embolism: Secondary | ICD-10-CM | POA: Diagnosis not present

## 2018-11-28 DIAGNOSIS — C9002 Multiple myeloma in relapse: Secondary | ICD-10-CM

## 2018-11-28 DIAGNOSIS — E876 Hypokalemia: Secondary | ICD-10-CM

## 2018-11-28 LAB — MAGNESIUM: Magnesium: 2.1 mg/dL (ref 1.7–2.4)

## 2018-11-28 LAB — CBC WITH DIFFERENTIAL/PLATELET
Abs Immature Granulocytes: 0.01 10*3/uL (ref 0.00–0.07)
Basophils Absolute: 0 10*3/uL (ref 0.0–0.1)
Basophils Relative: 1 %
Eosinophils Absolute: 0.5 10*3/uL (ref 0.0–0.5)
Eosinophils Relative: 8 %
HCT: 34.3 % — ABNORMAL LOW (ref 36.0–46.0)
Hemoglobin: 11.2 g/dL — ABNORMAL LOW (ref 12.0–15.0)
Immature Granulocytes: 0 %
Lymphocytes Relative: 38 %
Lymphs Abs: 2.3 10*3/uL (ref 0.7–4.0)
MCH: 30.7 pg (ref 26.0–34.0)
MCHC: 32.7 g/dL (ref 30.0–36.0)
MCV: 94 fL (ref 80.0–100.0)
Monocytes Absolute: 0.3 10*3/uL (ref 0.1–1.0)
Monocytes Relative: 5 %
Neutro Abs: 2.9 10*3/uL (ref 1.7–7.7)
Neutrophils Relative %: 48 %
Platelets: 256 10*3/uL (ref 150–400)
RBC: 3.65 MIL/uL — ABNORMAL LOW (ref 3.87–5.11)
RDW: 13.9 % (ref 11.5–15.5)
WBC: 6.1 10*3/uL (ref 4.0–10.5)
nRBC: 0 % (ref 0.0–0.2)

## 2018-11-28 LAB — COMPREHENSIVE METABOLIC PANEL
ALT: 8 U/L (ref 0–44)
AST: 13 U/L — ABNORMAL LOW (ref 15–41)
Albumin: 4.1 g/dL (ref 3.5–5.0)
Alkaline Phosphatase: 57 U/L (ref 38–126)
Anion gap: 7 (ref 5–15)
BUN: 14 mg/dL (ref 8–23)
CO2: 22 mmol/L (ref 22–32)
Calcium: 8.8 mg/dL — ABNORMAL LOW (ref 8.9–10.3)
Chloride: 113 mmol/L — ABNORMAL HIGH (ref 98–111)
Creatinine, Ser: 0.75 mg/dL (ref 0.44–1.00)
GFR calc Af Amer: >60 mL/min (ref >60)
GFR calc non Af Amer: >60 mL/min (ref >60)
Glucose, Bld: 85 mg/dL (ref 70–99)
Potassium: 3.5 mmol/L (ref 3.5–5.1)
Sodium: 142 mmol/L (ref 135–145)
Total Bilirubin: 0.4 mg/dL (ref 0.3–1.2)
Total Protein: 7 g/dL (ref 6.5–8.1)

## 2018-11-28 MED ORDER — SODIUM CHLORIDE 0.9 % IV SOLN
Freq: Once | INTRAVENOUS | Status: AC
  Administered 2018-11-28: 11:00:00 via INTRAVENOUS
  Filled 2018-11-28: qty 250

## 2018-11-28 MED ORDER — LENALIDOMIDE 10 MG PO CAPS: 10.0000 mg | ORAL_CAPSULE | Freq: Every day | ORAL | 0 refills | Status: DC

## 2018-11-28 MED ORDER — ZOLEDRONIC ACID 4 MG/100ML IV SOLN
4.0000 mg | Freq: Once | INTRAVENOUS | Status: AC
  Administered 2018-11-28: 4 mg via INTRAVENOUS

## 2018-11-28 MED ORDER — ZOLEDRONIC ACID 4 MG/100ML IV SOLN
4.0000 mg | Freq: Once | INTRAVENOUS | Status: DC
  Filled 2018-11-28: qty 100

## 2018-11-28 MED ORDER — ZOLEDRONIC ACID 4 MG/5ML IV CONC
4.0000 mg | Freq: Once | INTRAVENOUS | Status: DC
  Filled 2018-11-28: qty 5

## 2018-11-28 NOTE — Telephone Encounter (Signed)
Oral Oncology Pharmacist Encounter  Received new prescription for Revlimid (lenalidomide) for the maintenance therapy following autologous hematopoietic stem cell transplantation, planned duration until disease progression or unacceptable drug toxicity.  CMP from 11/28/2018 assessed, no relevant lab abnormalities. Prescription dose and frequency assessed.   Current medication list in Epic reviewed, no DDIs with Revlimid identified.  Prescription has been e-scribed to [facility].  Oral Oncology Clinic will continue to follow for insurance authorization, copayment issues, initial counseling and start date.  Darl Pikes, PharmD, BCPS, [facility] Hematology/Oncology Clinical Pharmacist [facility] [phone]  11/28/2018 3:32 PM

## 2018-11-28 NOTE — Telephone Encounter (Signed)
Oral Oncology Patient Advocate Encounter  Received notification from [employer] that prior authorization for Revlimid is required.  PA submitted on CoverMyMeds Key [id number] Status is pending  Oral Oncology Clinic will continue to follow.  [place] Patient [place] Phone [phone] Fax [phone] 11/28/2018 3:58 PM

## 2018-11-28 NOTE — Progress Notes (Signed)
I connected with Brandi [name] on 11/28/18 at  1:00 PM EDT by telephone visit and verified that I am speaking with the correct person using two identifiers.  I discussed the limitations, risks, security and privacy concerns of performing an evaluation and management service by telemedicine and the availability of in-person appointments. I also discussed with the patient that there may be a patient responsible charge related to this service. The patient expressed understanding and agreed to proceed.    Other persons participating in the visit and their role in the encounter: None Patient's location: Home Provider's location: Home  Oncology History   # SEP 2018- MULTIPLE MYELOMA IgALamda [2.5 gm/dl; K/L= 88/1298]; STAGE III [beta 2 microglobulin=5.5] [presented with acute renal failure; anemia; NO hypercalcemia; Skeletal survey-Normal]; BMBx- 45% plasma cells; FISH-POSITIVE 11:14 translocation.[STANDARD-high RISK]/cyto-Normal; SEP 2018- PET- L3 posterior element lesion.   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       [place]  11/07/2017 -  Chemotherapy    The patient had palonosetron (ALOXI) injection 0.25  mg, 0.25 mg, Intravenous,  Once, 6 of 7 cycles Administration: 0.25 mg (11/21/2017), 0.25 mg (11/29/2017), 0.25 mg (12/05/2017), 0.25 mg (12/21/2017), 0.25 mg (12/28/2017), 0.25 mg (01/04/2018), 0.25 mg (01/18/2018), 0.25 mg (01/25/2018), 0.25 mg (02/01/2018), 0.25 mg (02/15/2018), 0.25 mg (02/22/2018), 0.25 mg (03/01/2018), 0.25 mg (03/15/2018), 0.25 mg (03/22/2018), 0.25 mg (03/30/2018), 0.25 mg (04/13/2018), 0.25 mg (04/20/2018), 0.25 mg (04/27/2018) cyclophosphamide (CYTOXAN) 500 mg in sodium chloride 0.9 % 250 mL chemo infusion, 540 mg, Intravenous,  Once, 6 of 7 cycles Administration: 500 mg (11/21/2017), 500 mg (11/29/2017), 500 mg (12/05/2017), 500 mg (12/28/2017), 500 mg (01/04/2018), 500 mg (01/18/2018), 500 mg (01/25/2018), 500 mg (02/01/2018), 500 mg (02/15/2018), 500 mg (02/22/2018), 500 mg (03/01/2018), 500 mg (03/15/2018), 500 mg (03/30/2018), 500 mg (04/13/2018), 500 mg (04/20/2018), 500 mg (04/27/2018) carfilzomib (KYPROLIS) 36 mg in dextrose 5 % 50 mL chemo infusion, 20 mg/m2 = 36 mg, Intravenous, Once, 6 of 7 cycles Administration: 36 mg (11/21/2017), 36 mg (11/22/2017), 60 mg (11/29/2017), 60 mg (11/30/2017), 60 mg (12/05/2017), 60 mg (12/06/2017), 60 mg (12/21/2017), 60 mg (12/22/2017), 60 mg (12/28/2017), 60 mg (12/29/2017), 60 mg (01/04/2018), 60 mg (01/05/2018), 60 mg (01/18/2018), 60 mg (01/19/2018), 60 mg (01/25/2018), 60 mg (01/27/2018), 60 mg (02/01/2018), 60 mg (02/02/2018), 60 mg (02/15/2018), 60 mg (02/16/2018), 60 mg (02/22/2018), 60 mg (03/01/2018), 60 mg (03/02/2018), 60 mg (03/15/2018), 60 mg (03/16/2018), 60 mg (03/23/2018), 60 mg (03/30/2018), 60 mg (03/31/2018), 60 mg (04/13/2018), 60 mg (04/14/2018), 60 mg (04/20/2018), 60 mg (04/21/2018), 60 mg (04/27/2018)  for chemotherapy treatment.       Chief Complaint: Multiple myeloma   History of present illness:Brandi E [age]Petko89.o.  female with history of multiple myeloma status post autologous stem cell transplant in November 2019.  [facility]  Patient feels well.   No new shortness of breath or cough.  No swelling in the legs.  No bone pain.  Appetite is good.  Observation/objective:  Assessment and plan: Multiple myeloma in relapse [facility]) #Multiple myeloma stage III high-risk cytogenetics-status post KRD-VG PR ; status post autologousstem cell transplant on 06/15/18.   #Clinically stable.  Proceed with maintenance Revlimid 10 mg 3 weeks on 1 week off.  #Bone lesions/pain stable-continue Zometa every 4 weeks.  # Right jugular DVT-status post Xarelto finished April 2020.  Start baby aspirin.  #Infection prophylaxis continue acyclovir; Ok to receive flu and pneumonia vaccines in 3 months at home; in 9 months [facility]/vaccinations.  # DISPOSITION: # follow up in first week of June-MD/labs- cbc/cmp Zometa-Dr.B  Follow-up instructions:  I discussed the assessment and treatment plan with the patient.  The patient was provided an opportunity to ask questions and all were answered.  The patient agreed with the plan and demonstrated understanding of instructions.  The patient was advised to call back or seek an in person evaluation if the symptoms worsen or if the condition fails to improve as anticipated.  I provided 12 minutes of non face-to-face telephone visit time during this encounter, and > 50% was spent counseling as documented under my assessment & plan.   Dr. Charlaine Dalton Banner at [facility] 11/28/2018 1:49 PM

## 2018-11-28 NOTE — Progress Notes (Signed)
Calcium 8.8, Per Dr. Rogue Bussing okay to proceed with Zometa treatment.

## 2018-11-28 NOTE — Telephone Encounter (Signed)
Oral Oncology Patient Advocate Encounter  Prior Authorization for Revlimid has been approved.    PA# [id number] Effective dates: 11/28/2018 through 07/26/2019  Patients co-pay is $2.73.    Oral Oncology Clinic will continue to follow.   [place] Patient [place] Phone [phone] Fax [phone] 11/28/2018 4:03 PM

## 2018-11-28 NOTE — Assessment & Plan Note (Signed)
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   #Clinically stable.  Proceed with maintenance Revlimid 10 mg 3 weeks on 1 week off.  #Bone lesions/pain stable-continue Zometa every 4 weeks.  # Right jugular DVT-status post Xarelto finished April 2020.  Start baby aspirin.  #Infection prophylaxis continue acyclovir; Ok to receive flu and pneumonia vaccines in 3 months at home; in 9 months [facility]/vaccinations.  # DISPOSITION: # follow up in first week of June-MD/labs- cbc/cmp Zometa-Dr.B

## 2018-11-29 ENCOUNTER — Other Ambulatory Visit: Payer: Self-pay | Admitting: *Deleted

## 2018-11-29 DIAGNOSIS — C9002 Multiple myeloma in relapse: Secondary | ICD-10-CM

## 2018-11-29 LAB — MULTIPLE MYELOMA PANEL, SERUM
Albumin SerPl Elph-Mcnc: 3.7 g/dL (ref 2.9–4.4)
Albumin/Glob SerPl: 1.5 (ref 0.7–1.7)
Alpha 1: 0.1 g/dL (ref 0.0–0.4)
Alpha2 Glob SerPl Elph-Mcnc: 0.9 g/dL (ref 0.4–1.0)
B-Globulin SerPl Elph-Mcnc: 0.8 g/dL (ref 0.7–1.3)
Gamma Glob SerPl Elph-Mcnc: 0.8 g/dL (ref 0.4–1.8)
Globulin, Total: 2.5 g/dL (ref 2.2–3.9)
IgA: 92 mg/dL (ref 87–352)
IgG (Immunoglobin G), Serum: 962 mg/dL (ref 586–1602)
IgM (Immunoglobulin M), Srm: 33 mg/dL (ref 26–217)
Total Protein ELP: 6.2 g/dL (ref 6.0–8.5)

## 2018-11-29 LAB — KAPPA/LAMBDA LIGHT CHAINS
Kappa free light chain: 7.4 mg/L (ref 3.3–19.4)
Kappa, lambda light chain ratio: 0.89 (ref 0.26–1.65)
Lambda free light chains: 8.3 mg/L (ref 5.7–26.3)

## 2018-11-29 MED ORDER — LENALIDOMIDE 10 MG PO CAPS: 10.0000 mg | ORAL_CAPSULE | Freq: Every day | ORAL | 0 refills | Status: DC

## 2018-11-30 NOTE — Telephone Encounter (Signed)
Oral Chemotherapy Pharmacist Encounter   [facility] scheduled Brandi [name] for a 11/30/2018 delivery of her Revlimid.  Darl Pikes, PharmD, BCPS, [facility] Hematology/Oncology Clinical Pharmacist [facility]/AP [facility] [phone]  11/30/2018 8:39 AM

## 2018-12-25 ENCOUNTER — Other Ambulatory Visit: Payer: Self-pay | Admitting: *Deleted

## 2018-12-25 ENCOUNTER — Other Ambulatory Visit: Payer: Self-pay

## 2018-12-25 DIAGNOSIS — C9002 Multiple myeloma in relapse: Secondary | ICD-10-CM

## 2018-12-25 MED ORDER — LENALIDOMIDE 10 MG PO CAPS: 10.0000 mg | ORAL_CAPSULE | Freq: Every day | ORAL | 0 refills | Status: DC

## 2018-12-26 ENCOUNTER — Inpatient Hospital Stay: Payer: Medicare Other | Attending: Internal Medicine

## 2018-12-26 ENCOUNTER — Inpatient Hospital Stay (HOSPITAL_BASED_OUTPATIENT_CLINIC_OR_DEPARTMENT_OTHER): Payer: Medicare Other | Admitting: Internal Medicine

## 2018-12-26 ENCOUNTER — Other Ambulatory Visit: Payer: Self-pay

## 2018-12-26 ENCOUNTER — Inpatient Hospital Stay: Payer: Medicare Other

## 2018-12-26 ENCOUNTER — Encounter: Payer: Self-pay | Admitting: Internal Medicine

## 2018-12-26 VITALS — BP 130/70 | HR 60 | Temp 97.7°F | Resp 20 | Ht 61.0 in | Wt 154.0 lb

## 2018-12-26 DIAGNOSIS — Z86718 Personal history of other venous thrombosis and embolism: Secondary | ICD-10-CM

## 2018-12-26 DIAGNOSIS — I10 Essential (primary) hypertension: Secondary | ICD-10-CM | POA: Diagnosis not present

## 2018-12-26 DIAGNOSIS — F17210 Nicotine dependence, cigarettes, uncomplicated: Secondary | ICD-10-CM

## 2018-12-26 DIAGNOSIS — C9002 Multiple myeloma in relapse: Secondary | ICD-10-CM

## 2018-12-26 DIAGNOSIS — Z7982 Long term (current) use of aspirin: Secondary | ICD-10-CM | POA: Insufficient documentation

## 2018-12-26 DIAGNOSIS — Z79899 Other long term (current) drug therapy: Secondary | ICD-10-CM | POA: Insufficient documentation

## 2018-12-26 LAB — COMPREHENSIVE METABOLIC PANEL
ALT: 8 U/L (ref 0–44)
AST: 12 U/L — ABNORMAL LOW (ref 15–41)
Albumin: 4.3 g/dL (ref 3.5–5.0)
Alkaline Phosphatase: 70 U/L (ref 38–126)
Anion gap: 8 (ref 5–15)
BUN: 13 mg/dL (ref 8–23)
CO2: 23 mmol/L (ref 22–32)
Calcium: 8.9 mg/dL (ref 8.9–10.3)
Chloride: 112 mmol/L — ABNORMAL HIGH (ref 98–111)
Creatinine, Ser: 0.79 mg/dL (ref 0.44–1.00)
GFR calc Af Amer: >60 mL/min (ref >60)
GFR calc non Af Amer: >60 mL/min (ref >60)
Glucose, Bld: 90 mg/dL (ref 70–99)
Potassium: 3.2 mmol/L — ABNORMAL LOW (ref 3.5–5.1)
Sodium: 143 mmol/L (ref 135–145)
Total Bilirubin: 0.4 mg/dL (ref 0.3–1.2)
Total Protein: 7.4 g/dL (ref 6.5–8.1)

## 2018-12-26 LAB — CBC WITH DIFFERENTIAL/PLATELET
Abs Immature Granulocytes: 0.02 10*3/uL (ref 0.00–0.07)
Basophils Absolute: 0.1 10*3/uL (ref 0.0–0.1)
Basophils Relative: 1 %
Eosinophils Absolute: 0.9 10*3/uL — ABNORMAL HIGH (ref 0.0–0.5)
Eosinophils Relative: 12 %
HCT: 34.7 % — ABNORMAL LOW (ref 36.0–46.0)
Hemoglobin: 11 g/dL — ABNORMAL LOW (ref 12.0–15.0)
Immature Granulocytes: 0 %
Lymphocytes Relative: 47 %
Lymphs Abs: 3.7 10*3/uL (ref 0.7–4.0)
MCH: 29.9 pg (ref 26.0–34.0)
MCHC: 31.7 g/dL (ref 30.0–36.0)
MCV: 94.3 fL (ref 80.0–100.0)
Monocytes Absolute: 0.4 10*3/uL (ref 0.1–1.0)
Monocytes Relative: 5 %
Neutro Abs: 2.7 10*3/uL (ref 1.7–7.7)
Neutrophils Relative %: 35 %
Platelets: 291 10*3/uL (ref 150–400)
RBC: 3.68 MIL/uL — ABNORMAL LOW (ref 3.87–5.11)
RDW: 13.8 % (ref 11.5–15.5)
WBC: 7.8 10*3/uL (ref 4.0–10.5)
nRBC: 0 % (ref 0.0–0.2)

## 2018-12-26 MED ORDER — ACYCLOVIR 400 MG PO TABS: ORAL_TABLET | ORAL | 0 refills | Status: DC

## 2018-12-26 MED ORDER — PROCHLORPERAZINE MALEATE 10 MG PO TABS: 10.0000 mg | ORAL_TABLET | Freq: Four times a day (QID) | ORAL | 3 refills | Status: DC | PRN

## 2018-12-26 MED ORDER — ERGOCALCIFEROL 1.25 MG (50000 UT) PO CAPS: 50000.0000 [IU] | ORAL_CAPSULE | ORAL | 2 refills | Status: DC

## 2018-12-26 MED ORDER — TRAMADOL HCL 50 MG PO TABS: 50.0000 mg | ORAL_TABLET | Freq: Three times a day (TID) | ORAL | 0 refills | Status: DC | PRN

## 2018-12-26 NOTE — Assessment & Plan Note (Addendum)
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  May 2020 myeloma markers-absent myeloma protein/normal kappa lambda light chain ratio.   # Continue with maintenance Revlimid 10 mg 3 weeks on 1 week off.  Tolerating well  #Bone lesions/pain stable-last zometa on 5/05. HOLD Zometa sec to upcoming dental extractions.    # Right jugular DVT-status post Xarelto finished April 2020. Improved;  On baby aspirin.  #Infection prophylaxis continue acyclovir; Ok to receive flu and pneumonia vaccines in 3 months at home; in 9 months [facility]/vaccinations.  # DISPOSITION: # HOLD zometa # follow up in 4 weeks; MD- labs- cbc/cmp- Dr.B

## 2018-12-26 NOTE — Progress Notes (Signed)
[place] OFFICE PROGRESS NOTE  Patient Care Team: Center, [facility] as PCP - General ([place]) [name], [name]  Cancer Staging No matching staging information was found for the patient.   Oncology History   # SEP 2018- MULTIPLE MYELOMA IgALamda [2.5 gm/dl; K/L= 88/1298]; STAGE III [beta 2 microglobulin=5.5] [presented with acute renal failure; anemia; NO hypercalcemia; Skeletal survey-Normal]; BMBx- 45% plasma cells; FISH-POSITIVE 11:14 translocation.[STANDARD-high RISK]/cyto-Normal; SEP 2018- PET- L3 posterior element lesion.   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       [place]  11/07/2017 -  Chemotherapy    The patient had palonosetron (ALOXI) injection 0.25 mg, 0.25 mg, Intravenous,  Once, 6 of 7 cycles Administration: 0.25 mg (11/21/2017), 0.25 mg (11/29/2017), 0.25 mg (12/05/2017), 0.25 mg (12/21/2017), 0.25 mg (12/28/2017), 0.25 mg (01/04/2018), 0.25 mg (01/18/2018), 0.25 mg (01/25/2018), 0.25 mg (02/01/2018), 0.25 mg (02/15/2018), 0.25 mg (02/22/2018), 0.25 mg (03/01/2018), 0.25 mg (03/15/2018), 0.25 mg (03/22/2018), 0.25 mg  (03/30/2018), 0.25 mg (04/13/2018), 0.25 mg (04/20/2018), 0.25 mg (04/27/2018) cyclophosphamide (CYTOXAN) 500 mg in sodium chloride 0.9 % 250 mL chemo infusion, 540 mg, Intravenous,  Once, 6 of 7 cycles Administration: 500 mg (11/21/2017), 500 mg (11/29/2017), 500 mg (12/05/2017), 500 mg (12/28/2017), 500 mg (01/04/2018), 500 mg (01/18/2018), 500 mg (01/25/2018), 500 mg (02/01/2018), 500 mg (02/15/2018), 500 mg (02/22/2018), 500 mg (03/01/2018), 500 mg (03/15/2018), 500 mg (03/30/2018), 500 mg (04/13/2018), 500 mg (04/20/2018), 500 mg (04/27/2018) carfilzomib (KYPROLIS) 36 mg in dextrose 5 % 50 mL chemo infusion, 20 mg/m2 = 36 mg, Intravenous, Once, 6 of 7 cycles Administration: 36 mg (11/21/2017), 36 mg (11/22/2017), 60 mg (11/29/2017), 60 mg (11/30/2017), 60 mg (12/05/2017), 60 mg (12/06/2017), 60 mg (12/21/2017), 60 mg (12/22/2017), 60 mg (12/28/2017), 60 mg (12/29/2017), 60 mg (01/04/2018), 60 mg (01/05/2018), 60 mg (01/18/2018), 60 mg (01/19/2018), 60 mg (01/25/2018), 60 mg (01/27/2018), 60 mg (02/01/2018), 60 mg (02/02/2018), 60 mg (02/15/2018), 60 mg (02/16/2018), 60 mg (02/22/2018), 60 mg (03/01/2018), 60 mg (03/02/2018), 60 mg (03/15/2018), 60 mg (03/16/2018), 60 mg (03/23/2018), 60 mg (03/30/2018), 60 mg (03/31/2018), 60 mg (04/13/2018), 60 mg (04/14/2018), 60 mg (04/20/2018), 60 mg (04/21/2018), 60 mg (04/27/2018)  for chemotherapy treatment.        INTERVAL HISTORY:  Brandi KINTZEL89.o.  female pleasant patient above history of relapsed multiple myeloma-currently on maintenance Revlimid.  Patient denies any diarrhea or skin rash.  No fevers or chills.  Mild tingling and numbness in extremities.  Not any worse.  Appetite is good.  No weight loss.  No sore throat.  No fevers or chills.  Complains of broken tooth.  Review of Systems  Constitutional: Negative for chills, diaphoresis, fever and weight loss.  HENT: Negative for nosebleeds and sore throat.   Eyes: Negative for double vision.  Respiratory: Negative for hemoptysis, sputum production and  shortness of breath.   Cardiovascular: Negative for chest pain, palpitations and orthopnea.  Gastrointestinal: Negative for abdominal pain, blood in stool, constipation, heartburn, melena and vomiting.  Genitourinary: Negative for dysuria, frequency and urgency.  Skin: Negative.  Negative for itching and rash.  Neurological: Negative for dizziness, tingling, focal weakness, weakness and headaches.  Endo/Heme/Allergies: Does not bruise/bleed easily.  Psychiatric/Behavioral: Negative for depression. The patient is not nervous/anxious and does not have insomnia.       PAST MEDICAL HISTORY :  Past Medical History:  Diagnosis Date  . Hypertension   . Multiple myeloma ([place])     PAST SURGICAL HISTORY :   Past Surgical History:  Procedure Laterality Date  . IR FLUORO GUIDE PORT INSERTION RIGHT  12/16/2017    FAMILY HISTORY :   Family History  Problem Relation Age of Onset  . Pneumonia Mother   . Seizures Father     SOCIAL HISTORY:   Social History   Tobacco Use  . Smoking status: Current Every Day Smoker    Packs/day: 0.50    Types: Cigarettes  . Smokeless tobacco: Never Used  Substance Use Topics  . Alcohol use: No  . Drug use: No    ALLERGIES:  has No Known Allergies.  MEDICATIONS:  Current Outpatient Medications  Medication Sig Dispense Refill  . albuterol (PROVENTIL HFA;VENTOLIN HFA) 108 (90 Base) MCG/ACT inhaler Inhale 2 puffs into the lungs every 6 (six) hours as needed for wheezing or shortness of breath. 1 Inhaler 2  . amLODipine (NORVASC) 10 MG tablet Take 1 tablet (10 mg total) by mouth daily. 30 tablet 4  . aspirin EC 325 MG tablet Take 81 mg by mouth daily.     . ergocalciferol (VITAMIN D2) 1.25 MG (50000 UT) capsule Take 1 capsule (50,000 Units total) by mouth once a week. 12 capsule 2  . feeding supplement, ENSURE ENLIVE, (ENSURE ENLIVE) LIQD Take 237 mLs by mouth 2 (two) times daily between meals. 60 Bottle 0  . ondansetron (ZOFRAN) 8 MG tablet One pill  every 8 hours as needed for nausea/vomitting. 40 tablet 1  . pantoprazole (PROTONIX) 40 MG tablet Take 1 tablet (40 mg total) by mouth daily. 90 tablet 3  . potassium chloride SA (K-DUR,KLOR-CON) 20 MEQ tablet Take 1 tablet (20 mEq total) by mouth 2 (two) times daily. (Patient taking differently: Take 20 mEq by mouth daily. ) 14 tablet 1  . acyclovir (ZOVIRAX) 400 MG tablet TAKE 1 TABLET BY MOUTH ONCE DAILY TO  PREVENT  SHINGLES 30 tablet 0  . lenalidomide (REVLIMID) 10 MG capsule Take 1 capsule (10 mg total) by mouth daily. Take for 21 days then hold for 7 days, Repeat every 28 days. (Patient not taking: Reported on 12/26/2018) 21 capsule 0  . polyethylene glycol (MIRALAX / GLYCOLAX) packet Take 17 g by mouth daily as needed for mild constipation. 14 each 0  . prochlorperazine (COMPAZINE) 10 MG tablet Take 1 tablet (10 mg total) by mouth every 6 (six) hours as needed for nausea or vomiting. 30 tablet 3  . traMADol (ULTRAM) 50 MG tablet Take 1 tablet (50 mg total) by mouth every 8 (eight) hours as needed. 60 tablet 0   No current facility-administered medications for this visit.    Facility-Administered Medications Ordered in Other Visits  Medication Dose Route Frequency Provider Last Rate Last Dose  . sodium chloride flush (NS) 0.9 % injection 10 mL  10 mL Intracatheter PRN Shamal Stracener,  Elisha Headland, MD   10 mL at 02/23/18 1400    PHYSICAL EXAMINATION: ECOG PERFORMANCE STATUS: 1 - Symptomatic but completely ambulatory  Temp 97.7 F (36.5 C) (Tympanic)   Resp 20   Ht '5\' 1"'  (1.549 m)   Wt 154 lb (69.9 kg)   BMI 29.10 kg/m   Filed Weights   12/26/18 0928  Weight: 154 lb (69.9 kg)    Physical Exam  Constitutional: She is oriented to person, place, and time and well-developed, well-nourished, and in no distress.  Alone. She is walking herself.  HENT:  Head: Normocephalic and atraumatic.  Poor dentition.  Eyes: Pupils are equal, round, and reactive to light.  Neck: Normal range of motion.  Neck supple.  Cardiovascular: Normal rate and regular rhythm.  Pulmonary/Chest: Effort normal and breath sounds normal. No respiratory distress. She has no wheezes.  Abdominal: Soft. Bowel sounds are normal. She exhibits no distension and no mass. There is no abdominal tenderness. There is no rebound and no guarding.  Musculoskeletal: Normal range of motion.        General: No tenderness.  Neurological: She is alert and oriented to person, place, and time.  Skin: Skin is warm.  Psychiatric: Affect normal.       LABORATORY DATA:  I have reviewed the data as listed    Component Value Date/Time   NA 143 12/26/2018 0911   K 3.2 (L) 12/26/2018 0911   CL 112 (H) 12/26/2018 0911   CO2 23 12/26/2018 0911   GLUCOSE 90 12/26/2018 0911   BUN 13 12/26/2018 0911   CREATININE 0.79 12/26/2018 0911   CALCIUM 8.9 12/26/2018 0911   PROT 7.4 12/26/2018 0911   ALBUMIN 4.3 12/26/2018 0911   AST 12 (L) 12/26/2018 0911   ALT 8 12/26/2018 0911   ALKPHOS 70 12/26/2018 0911   BILITOT 0.4 12/26/2018 0911   GFRNONAA >60 12/26/2018 0911   GFRAA >60 12/26/2018 0911    No results found for: SPEP, UPEP  Lab Results  Component Value Date   WBC 7.8 12/26/2018   NEUTROABS 2.7 12/26/2018   HGB 11.0 (L) 12/26/2018   HCT 34.7 (L) 12/26/2018   MCV 94.3 12/26/2018   PLT 291 12/26/2018      Chemistry      Component Value Date/Time   NA 143 12/26/2018 0911   K 3.2 (L) 12/26/2018 0911   CL 112 (H) 12/26/2018 0911   CO2 23 12/26/2018 0911   BUN 13 12/26/2018 0911   CREATININE 0.79 12/26/2018 0911      Component Value Date/Time   CALCIUM 8.9 12/26/2018 0911   ALKPHOS 70 12/26/2018 0911   AST 12 (L) 12/26/2018 0911   ALT 8 12/26/2018 0911   BILITOT 0.4 12/26/2018 0911       RADIOGRAPHIC STUDIES: I have personally reviewed the radiological images as listed and agreed with the findings in the report. No results found.   ASSESSMENT & PLAN:  Multiple myeloma in relapse [facility]) #Multiple myeloma  stage III high-risk cytogenetics-status post KRD-VG PR ; status post autologousstem cell transplant on 06/15/18.  May 2020 myeloma markers-absent myeloma protein/normal kappa lambda light chain ratio.   # Continue with maintenance Revlimid 10 mg 3 weeks on 1 week off.  Tolerating well  #Bone lesions/pain stable-last zometa on 5/05. HOLD Zometa sec to upcoming dental extractions.    # Right jugular DVT-status post Xarelto finished April 2020. Improved;  On baby aspirin.  #Infection prophylaxis continue acyclovir; Ok to receive flu and pneumonia vaccines  in 3 months at home; in 9 months [facility]/vaccinations.  # DISPOSITION: # HOLD zometa # follow up in 4 weeks; MD- labs- cbc/cmp- Dr.B  No orders of the defined types were placed in this encounter.  All questions were answered. The patient knows to call the clinic with any problems, questions or concerns.      [name], MD 12/26/2018 1:01 PM

## 2019-01-10 ENCOUNTER — Telehealth: Payer: Self-pay | Admitting: *Deleted

## 2019-01-10 NOTE — Telephone Encounter (Signed)
Pharmacy called reporting that they have tried numerous times to reach patient regarding delivery of her Revlimid, but cannot reach her. They said if we speak to her to please have her call them. I called and spoke with patient and gave her their number to call them for delivery. She repeated it back to me

## 2019-01-22 ENCOUNTER — Other Ambulatory Visit: Payer: Self-pay | Admitting: *Deleted

## 2019-01-22 DIAGNOSIS — C9002 Multiple myeloma in relapse: Secondary | ICD-10-CM

## 2019-01-23 ENCOUNTER — Inpatient Hospital Stay (HOSPITAL_BASED_OUTPATIENT_CLINIC_OR_DEPARTMENT_OTHER): Payer: Medicare Other | Admitting: Internal Medicine

## 2019-01-23 ENCOUNTER — Encounter: Payer: Self-pay | Admitting: Internal Medicine

## 2019-01-23 ENCOUNTER — Other Ambulatory Visit: Payer: Self-pay

## 2019-01-23 ENCOUNTER — Inpatient Hospital Stay: Payer: Medicare Other

## 2019-01-23 DIAGNOSIS — C9002 Multiple myeloma in relapse: Secondary | ICD-10-CM | POA: Diagnosis not present

## 2019-01-23 DIAGNOSIS — F17210 Nicotine dependence, cigarettes, uncomplicated: Secondary | ICD-10-CM | POA: Diagnosis not present

## 2019-01-23 DIAGNOSIS — Z9484 Stem cells transplant status: Secondary | ICD-10-CM | POA: Diagnosis not present

## 2019-01-23 DIAGNOSIS — Z86718 Personal history of other venous thrombosis and embolism: Secondary | ICD-10-CM

## 2019-01-23 DIAGNOSIS — I10 Essential (primary) hypertension: Secondary | ICD-10-CM | POA: Diagnosis not present

## 2019-01-23 DIAGNOSIS — Z7982 Long term (current) use of aspirin: Secondary | ICD-10-CM

## 2019-01-23 LAB — COMPREHENSIVE METABOLIC PANEL
ALT: 61 U/L — ABNORMAL HIGH (ref 0–44)
AST: 34 U/L (ref 15–41)
Albumin: 4.2 g/dL (ref 3.5–5.0)
Alkaline Phosphatase: 81 U/L (ref 38–126)
Anion gap: 8 (ref 5–15)
BUN: 14 mg/dL (ref 8–23)
CO2: 24 mmol/L (ref 22–32)
Calcium: 9.3 mg/dL (ref 8.9–10.3)
Chloride: 110 mmol/L (ref 98–111)
Creatinine, Ser: 0.59 mg/dL (ref 0.44–1.00)
GFR calc Af Amer: >60 mL/min (ref >60)
GFR calc non Af Amer: >60 mL/min (ref >60)
Glucose, Bld: 97 mg/dL (ref 70–99)
Potassium: 3.7 mmol/L (ref 3.5–5.1)
Sodium: 142 mmol/L (ref 135–145)
Total Bilirubin: 0.6 mg/dL (ref 0.3–1.2)
Total Protein: 7.4 g/dL (ref 6.5–8.1)

## 2019-01-23 LAB — CBC WITH DIFFERENTIAL/PLATELET
Abs Immature Granulocytes: 0.02 10*3/uL (ref 0.00–0.07)
Basophils Absolute: 0 10*3/uL (ref 0.0–0.1)
Basophils Relative: 1 %
Eosinophils Absolute: 0.7 10*3/uL — ABNORMAL HIGH (ref 0.0–0.5)
Eosinophils Relative: 12 %
HCT: 34.4 % — ABNORMAL LOW (ref 36.0–46.0)
Hemoglobin: 11.2 g/dL — ABNORMAL LOW (ref 12.0–15.0)
Immature Granulocytes: 0 %
Lymphocytes Relative: 39 %
Lymphs Abs: 2.3 10*3/uL (ref 0.7–4.0)
MCH: 29.9 pg (ref 26.0–34.0)
MCHC: 32.6 g/dL (ref 30.0–36.0)
MCV: 91.7 fL (ref 80.0–100.0)
Monocytes Absolute: 0.4 10*3/uL (ref 0.1–1.0)
Monocytes Relative: 6 %
Neutro Abs: 2.5 10*3/uL (ref 1.7–7.7)
Neutrophils Relative %: 42 %
Platelets: 162 10*3/uL (ref 150–400)
RBC: 3.75 MIL/uL — ABNORMAL LOW (ref 3.87–5.11)
RDW: 13.5 % (ref 11.5–15.5)
WBC: 6 10*3/uL (ref 4.0–10.5)
nRBC: 0 % (ref 0.0–0.2)

## 2019-01-23 MED ORDER — POTASSIUM CHLORIDE CRYS ER 20 MEQ PO TBCR: 20.0000 meq | EXTENDED_RELEASE_TABLET | Freq: Once | ORAL | 0 refills | Status: DC

## 2019-01-23 MED ORDER — PANTOPRAZOLE SODIUM 40 MG PO TBEC: 40.0000 mg | DELAYED_RELEASE_TABLET | Freq: Every day | ORAL | 3 refills | Status: DC

## 2019-01-23 NOTE — Assessment & Plan Note (Addendum)
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  May 2020 myeloma markers-absent myeloma protein/normal kappa lambda light chain ratio.   # Continue with maintenance Revlimid 10 mg 3 weeks on 1 week off.  Tolerating well  # Bone lesions/pain stable-last zometa on 11/27/2017. HOLD Zometa sec to poor dentition. Recommend dental evaluation.   # Right jugular DVT-status post Xarelto finished April 2020. Improved;  On baby aspirin.  #Infection prophylaxis continue acyclovir- will get vaccinations at [facility] in Nov 2020  # DISPOSITION: # HOLD zometa # referral to dentist re: poor dentition # follow up in 4 weeks; MD- labs- cbc/cmp/MM panel;K-lmabda light chains- Dr.B

## 2019-01-23 NOTE — Progress Notes (Signed)
[place] OFFICE PROGRESS NOTE  Patient Care Team: Center, [facility] as PCP - General ([place]) [name], [name]  Cancer Staging No matching staging information was found for the patient.   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     Multiple myeloma in relapse ([facility] 11/07/2017 -  Chemotherapy   The patient had palonosetron (ALOXI) injection 0.25 mg, 0.25 mg, Intravenous,  Once, 6 of 7 cycles Administration: 0.25 mg (11/21/2017), 0.25 mg (11/29/2017), 0.25 mg (12/05/2017), 0.25 mg (12/21/2017), 0.25 mg (12/28/2017), 0.25 mg (01/04/2018), 0.25 mg (01/18/2018), 0.25 mg (01/25/2018), 0.25 mg (02/01/2018), 0.25 mg (02/15/2018), 0.25 mg (02/22/2018), 0.25 mg (03/01/2018), 0.25 mg (03/15/2018), 0.25 mg (03/22/2018),  0.25 mg (03/30/2018), 0.25 mg (04/13/2018), 0.25 mg (04/20/2018), 0.25 mg (04/27/2018) cyclophosphamide (CYTOXAN) 500 mg in sodium chloride 0.9 % 250 mL chemo infusion, 540 mg, Intravenous,  Once, 6 of 7 cycles Administration: 500 mg (11/21/2017), 500 mg (11/29/2017), 500 mg (12/05/2017), 500 mg (12/28/2017), 500 mg (01/04/2018), 500 mg (01/18/2018), 500 mg (01/25/2018), 500 mg (02/01/2018), 500 mg (02/15/2018), 500 mg (02/22/2018), 500 mg (03/01/2018), 500 mg (03/15/2018), 500 mg (03/30/2018), 500 mg (04/13/2018), 500 mg (04/20/2018), 500 mg (04/27/2018) carfilzomib (KYPROLIS) 36 mg in dextrose 5 % 50 mL chemo infusion, 20 mg/m2 = 36 mg, Intravenous, Once, 6 of 7 cycles Administration: 36 mg (11/21/2017), 36 mg (11/22/2017), 60 mg (11/29/2017), 60 mg (11/30/2017), 60 mg (12/05/2017), 60 mg (12/06/2017), 60 mg (12/21/2017), 60 mg (12/22/2017), 60 mg (12/28/2017), 60 mg (12/29/2017), 60 mg (01/04/2018), 60 mg (01/05/2018), 60 mg (01/18/2018), 60 mg (01/19/2018), 60 mg (01/25/2018), 60 mg (01/27/2018), 60 mg (02/01/2018), 60 mg (02/02/2018), 60 mg (02/15/2018), 60 mg (02/16/2018), 60 mg (02/22/2018), 60 mg (03/01/2018), 60 mg (03/02/2018), 60 mg (03/15/2018), 60 mg (03/16/2018), 60 mg (03/23/2018), 60 mg (03/30/2018), 60 mg (03/31/2018), 60 mg (04/13/2018), 60 mg (04/14/2018), 60 mg (04/20/2018), 60 mg (04/21/2018), 60 mg (04/27/2018)  for chemotherapy treatment.        INTERVAL HISTORY:  Brandi DEITER89.o.  female pleasant patient above history of relapsed multiple myeloma-currently on maintenance Revlimid.  Patient denies any nausea vomiting but denies any fevers or chills or rash.  Denies any bone pain.  Complains of broken tooth.  No pain.  Review of Systems  Constitutional: Negative for chills, diaphoresis, fever and weight loss.  HENT: Negative for nosebleeds and sore throat.   Eyes: Negative for double vision.  Respiratory: Negative for hemoptysis, sputum production and shortness of breath.   Cardiovascular: Negative for chest pain, palpitations and  orthopnea.  Gastrointestinal: Negative for abdominal pain, blood in stool, constipation, heartburn, melena and vomiting.  Genitourinary: Negative for dysuria, frequency and urgency.  Skin: Negative.  Negative for itching and rash.  Neurological: Negative for dizziness, tingling, focal weakness, weakness and headaches.  Endo/Heme/Allergies: Does not bruise/bleed easily.  Psychiatric/Behavioral: Negative for depression. The patient is not nervous/anxious and does not have insomnia.     PAST MEDICAL HISTORY :  Past Medical History:  Diagnosis Date  . Hypertension   . Multiple myeloma ([place])     PAST SURGICAL HISTORY :   Past Surgical History:  Procedure Laterality Date  . IR FLUORO GUIDE PORT INSERTION RIGHT  12/16/2017    FAMILY HISTORY :   Family History  Problem Relation Age of Onset  . Pneumonia Mother   . Seizures Father     SOCIAL HISTORY:   Social History   Tobacco Use  . Smoking status: Current Every Day Smoker    Packs/day: 0.50    Types: Cigarettes  . Smokeless tobacco: Never Used  Substance Use Topics  . Alcohol use: No  . Drug use: No    ALLERGIES:  has No Known Allergies.  MEDICATIONS:  Current Outpatient Medications  Medication Sig Dispense Refill  . acyclovir (ZOVIRAX) 400 MG tablet TAKE 1 TABLET BY MOUTH ONCE DAILY TO  PREVENT  SHINGLES 30 tablet 0  . albuterol (PROVENTIL HFA;VENTOLIN HFA) 108 (90 Base) MCG/ACT inhaler Inhale 2 puffs into the lungs every 6 (six) hours as needed for wheezing or shortness of breath. 1 Inhaler 2  . amLODipine (NORVASC) 10 MG tablet Take 1 tablet (10 mg total) by mouth daily. 30 tablet 4  . aspirin EC 325 MG tablet Take 81 mg by mouth daily.     . ergocalciferol (VITAMIN D2) 1.25 MG (50000 UT) capsule Take 1 capsule (50,000 Units total) by mouth once a week. 12 capsule 2  . feeding supplement, ENSURE ENLIVE, (ENSURE ENLIVE) LIQD Take 237 mLs by mouth 2 (two) times daily between meals. 60 Bottle 0  . lenalidomide (REVLIMID)  10 MG capsule Take 1 capsule (10 mg total) by mouth daily. Take for 21 days then hold for 7 days, Repeat every 28 days. 21 capsule 0  . pantoprazole (PROTONIX) 40 MG tablet Take 1 tablet (40 mg total) by mouth daily. 90 tablet 3  . potassium chloride SA (K-DUR) 20 MEQ tablet Take 1 tablet (20 mEq total) by mouth once for 1 dose. 1 tablet 0  . traMADol (ULTRAM) 50 MG tablet Take 1 tablet (50 mg total) by mouth every 8 (eight) hours as needed. 60 tablet 0  . ondansetron (ZOFRAN) 8 MG tablet One pill every 8 hours as needed for nausea/vomitting. (Patient not taking: Reported on 01/23/2019) 40 tablet 1  . polyethylene glycol (MIRALAX / GLYCOLAX) packet Take 17 g by mouth daily as needed for mild constipation. (Patient not taking: Reported on 01/23/2019) 14 each 0  . prochlorperazine (COMPAZINE) 10 MG tablet Take 1 tablet (10 mg total) by mouth every 6 (six) hours as needed for nausea or vomiting. (Patient not taking: Reported on 01/23/2019) 30 tablet 3   No current facility-administered medications for this visit.    Facility-Administered Medications Ordered in Other Visits  Medication Dose Route Frequency Provider Last Rate Last Dose  . sodium chloride flush (NS) 0.9 % injection 10 mL  10 mL Intracatheter PRN [name], MD   10 mL at 02/23/18 1400    PHYSICAL EXAMINATION: ECOG PERFORMANCE STATUS: 1 - Symptomatic but  completely ambulatory  BP 125/70   Pulse 65   Temp 98.4 F (36.9 C) (Tympanic)   Resp 20   Ht _0  (1.549 m)   Wt 164 lb 8 oz (74.6 kg)   BMI 31.08 kg/m   Filed Weights   01/23/19 1342  Weight: 164 lb 8 oz (74.6 kg)    Physical Exam  Constitutional: She is oriented to person, place, and time and well-developed, well-nourished, and in no distress.  Alone. She is walking herself.  HENT:  Head: Normocephalic and atraumatic.  Poor dentition.  Eyes: Pupils are equal, round, and reactive to light.  Neck: Normal range of motion. Neck supple.  Cardiovascular:  Normal rate and regular rhythm.  Pulmonary/Chest: Effort normal and breath sounds normal. No respiratory distress. She has no wheezes.  Abdominal: Soft. Bowel sounds are normal. She exhibits no distension and no mass. There is no abdominal tenderness. There is no rebound and no guarding.  Musculoskeletal: Normal range of motion.        General: No tenderness.  Neurological: She is alert and oriented to person, place, and time.  Skin: Skin is warm.  Psychiatric: Affect normal.   LABORATORY DATA:  I have reviewed the data as listed    Component Value Date/Time   NA 142 01/23/2019 1326   K 3.7 01/23/2019 1326   CL 110 01/23/2019 1326   CO2 24 01/23/2019 1326   GLUCOSE 97 01/23/2019 1326   BUN 14 01/23/2019 1326   CREATININE 0.59 01/23/2019 1326   CALCIUM 9.3 01/23/2019 1326   PROT 7.4 01/23/2019 1326   ALBUMIN 4.2 01/23/2019 1326   AST 34 01/23/2019 1326   ALT 61 (H) 01/23/2019 1326   ALKPHOS 81 01/23/2019 1326   BILITOT 0.6 01/23/2019 1326   GFRNONAA >60 01/23/2019 1326   GFRAA >60 01/23/2019 1326    No results found for: SPEP, UPEP  Lab Results  Component Value Date   WBC 6.0 01/23/2019   NEUTROABS 2.5 01/23/2019   HGB 11.2 (L) 01/23/2019   HCT 34.4 (L) 01/23/2019   MCV 91.7 01/23/2019   PLT 162 01/23/2019      Chemistry      Component Value Date/Time   NA 142 01/23/2019 1326   K 3.7 01/23/2019 1326   CL 110 01/23/2019 1326   CO2 24 01/23/2019 1326   BUN 14 01/23/2019 1326   CREATININE 0.59 01/23/2019 1326      Component Value Date/Time   CALCIUM 9.3 01/23/2019 1326   ALKPHOS 81 01/23/2019 1326   AST 34 01/23/2019 1326   ALT 61 (H) 01/23/2019 1326   BILITOT 0.6 01/23/2019 1326       RADIOGRAPHIC STUDIES: I have personally reviewed the radiological images as listed and agreed with the findings in the report. No results found.   ASSESSMENT & PLAN:  Multiple myeloma in relapse [facility]) #Multiple myeloma stage III high-risk cytogenetics-status post KRD-VG  PR ; status post autologousstem cell transplant on 06/15/18.  May 2020 myeloma markers-absent myeloma protein/normal kappa lambda light chain ratio.   # Continue with maintenance Revlimid 10 mg 3 weeks on 1 week off.  Tolerating well  # Bone lesions/pain stable-last zometa on 11/27/2017. HOLD Zometa sec to poor dentition. Recommend dental evaluation.   # Right jugular DVT-status post Xarelto finished April 2020. Improved;  On baby aspirin.  #Infection prophylaxis continue acyclovir- will get vaccinations at [facility] in Nov 2020  # DISPOSITION: # HOLD zometa # referral to dentist re: poor dentition # follow  up in 4 weeks; MD- labs- cbc/cmp/MM panel;K-lmabda light chains- Dr.B  Orders Placed This Encounter  Procedures  . CBC with Differential    Standing Status:   Future    Standing Expiration Date:   01/23/2020  . Comprehensive metabolic panel    Standing Status:   Future    Standing Expiration Date:   01/23/2020  . Multiple Myeloma Panel (SPEP&IFE w/QIG)    Standing Status:   Future    Standing Expiration Date:   01/23/2020  . Kappa/lambda light chains    Standing Status:   Future    Standing Expiration Date:   01/23/2020   All questions were answered. The patient knows to call the clinic with any problems, questions or concerns.      [name], MD 01/23/2019 7:19 PM

## 2019-02-02 ENCOUNTER — Other Ambulatory Visit: Payer: Self-pay | Admitting: *Deleted

## 2019-02-02 DIAGNOSIS — C9002 Multiple myeloma in relapse: Secondary | ICD-10-CM

## 2019-02-05 ENCOUNTER — Telehealth: Payer: Self-pay | Admitting: *Deleted

## 2019-02-05 DIAGNOSIS — C9002 Multiple myeloma in relapse: Secondary | ICD-10-CM

## 2019-02-05 MED ORDER — POTASSIUM CHLORIDE CRYS ER 20 MEQ PO TBCR: 20.0000 meq | EXTENDED_RELEASE_TABLET | Freq: Once | ORAL | 6 refills | Status: DC

## 2019-02-05 MED ORDER — LENALIDOMIDE 10 MG PO CAPS: 10.0000 mg | ORAL_CAPSULE | Freq: Every day | ORAL | 0 refills | Status: DC

## 2019-02-05 NOTE — Telephone Encounter (Signed)
Spoke with Dr. Rogue Bussing. V/o to send new rx for potassium 20 meq dosing 1 tablet daily # 30 and 6RFs. Patient will need to stay on oral potassium as she has chronic hypokalemia. Last labs reviewed with md prior to sending this scripts  Also patient inquired if she would be referred to the dental clinic.  Pt has no dental insurance. Only has [employer] and financial barriers.   [facility] see if [facility] are able to accommodate patient.

## 2019-02-05 NOTE — Telephone Encounter (Signed)
-----   Message from [place], [place] sent at 02/05/2019 12:59 PM EDT ----- The patient call and left a message with a refill request for potassium 20 meq 1 tab po daily.  I have spoken with the patient and she states she has been out of medication for 5 days. If possible she can you please give her a call. [phone]. Thanks

## 2019-02-08 NOTE — Telephone Encounter (Signed)
[facility] are currently not offering any onsite dental services and in general refer their patients to [facility] for the Dental procedures ([phone]). I contacted this office. They are currently not accepting any new dental patients until after August. The receptionist suggested that I contact [facility] at [phone]). Unable to reach this clinic due to calling this clinic at [place] yesterday.  I attempted to reach the patient to determine what she would like to do. I am uncertain if pt is willing to drive to [facility] for her free dental care. Pt has [employer], which limits the clinics who accept this insurance. She has no private pay [employer]. When patient returns my phone call will discuss the options for the patient and suggest that she reach out to either of the clinics above to determine when they can see her.

## 2019-02-20 ENCOUNTER — Inpatient Hospital Stay (HOSPITAL_BASED_OUTPATIENT_CLINIC_OR_DEPARTMENT_OTHER): Payer: Medicare Other | Admitting: Oncology

## 2019-02-20 ENCOUNTER — Encounter: Payer: Self-pay | Admitting: Oncology

## 2019-02-20 ENCOUNTER — Other Ambulatory Visit: Payer: Self-pay | Admitting: Oncology

## 2019-02-20 ENCOUNTER — Other Ambulatory Visit: Payer: Self-pay | Admitting: *Deleted

## 2019-02-20 ENCOUNTER — Ambulatory Visit
Admission: RE | Admit: 2019-02-20 | Discharge: 2019-02-20 | Disposition: A | Discharge status: Home / Self Care | Payer: Medicare Other | Source: Ambulatory Visit | Attending: Oncology | Admitting: Oncology

## 2019-02-20 ENCOUNTER — Inpatient Hospital Stay: Payer: Medicare Other | Attending: Internal Medicine

## 2019-02-20 ENCOUNTER — Inpatient Hospital Stay: Payer: Medicare Other | Admitting: Internal Medicine

## 2019-02-20 ENCOUNTER — Other Ambulatory Visit: Payer: Self-pay

## 2019-02-20 VITALS — BP 112/73 | HR 88 | Temp 96.9°F | Resp 18 | Wt 138.1 lb

## 2019-02-20 DIAGNOSIS — R634 Abnormal weight loss: Secondary | ICD-10-CM

## 2019-02-20 DIAGNOSIS — E876 Hypokalemia: Secondary | ICD-10-CM | POA: Diagnosis not present

## 2019-02-20 DIAGNOSIS — R0602 Shortness of breath: Secondary | ICD-10-CM | POA: Diagnosis not present

## 2019-02-20 DIAGNOSIS — F17210 Nicotine dependence, cigarettes, uncomplicated: Secondary | ICD-10-CM | POA: Diagnosis not present

## 2019-02-20 DIAGNOSIS — C9002 Multiple myeloma in relapse: Secondary | ICD-10-CM | POA: Insufficient documentation

## 2019-02-20 DIAGNOSIS — I10 Essential (primary) hypertension: Secondary | ICD-10-CM | POA: Insufficient documentation

## 2019-02-20 LAB — COMPREHENSIVE METABOLIC PANEL
ALT: 54 U/L — ABNORMAL HIGH (ref 0–44)
AST: 48 U/L — ABNORMAL HIGH (ref 15–41)
Albumin: 3.6 g/dL (ref 3.5–5.0)
Alkaline Phosphatase: 206 U/L — ABNORMAL HIGH (ref 38–126)
Anion gap: 11 (ref 5–15)
BUN: 10 mg/dL (ref 8–23)
CO2: 22 mmol/L (ref 22–32)
Calcium: 9.1 mg/dL (ref 8.9–10.3)
Chloride: 106 mmol/L (ref 98–111)
Creatinine, Ser: 0.56 mg/dL (ref 0.44–1.00)
GFR calc Af Amer: >60 mL/min (ref >60)
GFR calc non Af Amer: >60 mL/min (ref >60)
Glucose, Bld: 113 mg/dL — ABNORMAL HIGH (ref 70–99)
Potassium: 3.3 mmol/L — ABNORMAL LOW (ref 3.5–5.1)
Sodium: 139 mmol/L (ref 135–145)
Total Bilirubin: 0.7 mg/dL (ref 0.3–1.2)
Total Protein: 7 g/dL (ref 6.5–8.1)

## 2019-02-20 LAB — CBC WITH DIFFERENTIAL/PLATELET
Abs Immature Granulocytes: 0.01 10*3/uL (ref 0.00–0.07)
Basophils Absolute: 0.1 10*3/uL (ref 0.0–0.1)
Basophils Relative: 1 %
Eosinophils Absolute: 0.6 10*3/uL — ABNORMAL HIGH (ref 0.0–0.5)
Eosinophils Relative: 12 %
HCT: 35 % — ABNORMAL LOW (ref 36.0–46.0)
Hemoglobin: 11.1 g/dL — ABNORMAL LOW (ref 12.0–15.0)
Immature Granulocytes: 0 %
Lymphocytes Relative: 40 %
Lymphs Abs: 2.1 10*3/uL (ref 0.7–4.0)
MCH: 28.5 pg (ref 26.0–34.0)
MCHC: 31.7 g/dL (ref 30.0–36.0)
MCV: 89.7 fL (ref 80.0–100.0)
Monocytes Absolute: 0.2 10*3/uL (ref 0.1–1.0)
Monocytes Relative: 5 %
Neutro Abs: 2.2 10*3/uL (ref 1.7–7.7)
Neutrophils Relative %: 42 %
Platelets: 218 10*3/uL (ref 150–400)
RBC: 3.9 MIL/uL (ref 3.87–5.11)
RDW: 13.2 % (ref 11.5–15.5)
WBC: 5.2 10*3/uL (ref 4.0–10.5)
nRBC: 0 % (ref 0.0–0.2)

## 2019-02-20 NOTE — Patient Instructions (Signed)
I am so sorry you are not feeling well today.  Today while you were in clinic we had you complete a chest x-ray which was negative for acute issue.  For your acid reflux/GERD-like symptoms.  Continue Protonix as prescribed.  I do not know if you are taking this medication at home but please compare to your medication list with medications at home.  Continue Mucinex if this appears to be helping you.  Continue to stay hydrated and drink between 50 to 70 ounces of fluid daily.  Continue eating soft foods to prevent them from getting stuck.  I will provide you with supplements such as Ensure to see if this helps get additional protein in your diet.  You have lost about 8 pounds since your last visit.  Since you are feeling hungry I will not prescribe a stimulant at this time.  Continue your antinausea medications when eating meals to see if this is helpful.  Stay as active as possible to keep her strength up.  Both of your ankles are swollen.  Keep them elevated when sitting and at bedtime.  If they get any worse please let us know.  You also may benefit from TED hose.  Please call with continued concerns.  Faythe Casa, NP 02/20/2019 12:48 PM

## 2019-02-20 NOTE — Progress Notes (Signed)
Symptom Management Consult note [facility]  [phone] [phone]  Patient Care Team: [facility] as PCP - General (General Practice) [name], [name]   Name of the patient: Brandi [name]  [id number]  17-Jul-1951   Date of visit: 02/20/2019   Diagnosis- Multiple Myeloma   Chief complaint/ Reason for visit-  SOB and dysphagia  Heme/Onc history:  Oncology History Overview Note  # SEP 2018- MULTIPLE MYELOMA IgALamda [2.5 gm/dl; K/L= 88/1298]; STAGE III [beta 2 microglobulin=5.5] [presented with acute renal failure; anemia; NO hypercalcemia; Skeletal survey-Normal]; BMBx- 45% plasma cells; FISH-POSITIVE 11:14 translocation.[STANDARD-high RISK]/cyto-Normal; SEP 2018- PET- L3 posterior element lesion.   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     [place] -  Chemotherapy   The patient had palonosetron (ALOXI) injection 0.25 mg, 0.25 mg, Intravenous,  Once, 6 of 7 cycles Administration: 0.25 mg (11/21/2017), 0.25 mg (11/29/2017), 0.25 mg (12/05/2017), 0.25 mg (12/21/2017), 0.25 mg  (12/28/2017), 0.25 mg (01/04/2018), 0.25 mg (01/18/2018), 0.25 mg (01/25/2018), 0.25 mg (02/01/2018), 0.25 mg (02/15/2018), 0.25 mg (02/22/2018), 0.25 mg (03/01/2018), 0.25 mg (03/15/2018), 0.25 mg (03/22/2018), 0.25 mg (03/30/2018), 0.25 mg (04/13/2018), 0.25 mg (04/20/2018), 0.25 mg (04/27/2018) cyclophosphamide (CYTOXAN) 500 mg in sodium chloride 0.9 % 250 mL chemo infusion, 540 mg, Intravenous,  Once, 6 of 7 cycles Administration: 500 mg (11/21/2017), 500 mg (11/29/2017), 500 mg (12/05/2017), 500 mg (12/28/2017), 500 mg (01/04/2018), 500 mg (01/18/2018), 500 mg (01/25/2018), 500 mg (02/01/2018), 500 mg (02/15/2018), 500 mg (02/22/2018), 500 mg (03/01/2018), 500 mg (03/15/2018), 500 mg (03/30/2018), 500 mg (04/13/2018), 500 mg (04/20/2018), 500 mg (04/27/2018) carfilzomib (KYPROLIS) 36 mg in dextrose 5 % 50 mL chemo infusion, 20 mg/m2 = 36 mg, Intravenous, Once, 6 of 7 cycles Administration: 36 mg (11/21/2017), 36 mg (11/22/2017), 60 mg (11/29/2017), 60 mg (11/30/2017), 60 mg (12/05/2017), 60 mg (12/06/2017), 60 mg (12/21/2017), 60 mg (12/22/2017), 60 mg (12/28/2017), 60 mg (12/29/2017), 60 mg (01/04/2018), 60 mg (01/05/2018), 60 mg (01/18/2018), 60 mg (01/19/2018), 60 mg (01/25/2018), 60 mg (01/27/2018), 60 mg (02/01/2018), 60 mg (02/02/2018), 60 mg (02/15/2018), 60 mg (02/16/2018), 60 mg (02/22/2018), 60 mg (03/01/2018), 60 mg (03/02/2018), 60 mg (03/15/2018), 60 mg (03/16/2018), 60 mg (03/23/2018), 60 mg (03/30/2018), 60 mg (03/31/2018), 60 mg (04/13/2018), 60 mg (04/14/2018), 60 mg (04/20/2018), 60 mg (04/21/2018), 60 mg (04/27/2018)  for chemotherapy treatment.      Interval history-Mrs. Brandi [name] a [age]ear [facility].  She denies fever.  Patient last seen by oncologist on 01/23/2019 where she appeared to be doing well.  She continued her maintenance Revlimid 10 mg 3 weeks on 1 week off.  On infection prophylaxis with acyclovir.  [facility]   Myeloma markers continue to be negative.  On Zometa for bony lesions.  Has history of right jugular DVT.  Completed Eliquis in April 2020.  Continues on baby aspirin.  Currently off Zometa due to dental infection.  Last given in May 2020.  Still attempting to find her free dental care.   Today, she complains of increased shortness of breath with exertion, dysphasia and bilateral lower extremity swelling.  Symptoms have been present for approximately 3 weeks.  She admits to feeling hungry but feels like her food gets stuck in the top of her throat.  She occasionally has vomiting when food does not go down.  She is able to drink liquids and eat soft foods without problem.  She estimates consuming approximately 1 L of water daily.  She states that dysphasia/GERD-like symptoms have been present most of her life and were relieved with the use of Zantac. She stopped Zantac about 6 months ago.  She is unsure if she has been taking Protonix daily as prescribed.  She denies any recent fevers or illnesses. Denies any easy bleeding or bruising. . Denies chest pain. Denies any nausea constipation, or diarrhea. Denies urinary complaints. Patient offers no further specific complaints today.   ECOG FS:2 - Symptomatic, <50% confined to bed  Review of systems- Review of Systems  Constitutional: Positive for malaise/fatigue and weight loss. Negative for chills and fever.  HENT: Negative for congestion, ear pain and tinnitus.   Eyes: Negative.  Negative for blurred vision and double vision.  Respiratory: Positive for shortness of breath. Negative for cough and sputum production.   Cardiovascular: Positive for leg swelling. Negative for chest pain and palpitations.  Gastrointestinal: Positive for heartburn and vomiting. Negative for abdominal pain, constipation, diarrhea and nausea.  Genitourinary: Negative for dysuria, frequency and urgency.  Musculoskeletal: Negative for back pain and falls.    Skin: Negative.  Negative for rash.  Neurological: Positive for weakness. Negative for headaches.  Endo/Heme/Allergies: Negative.  Does not bruise/bleed easily.  Psychiatric/Behavioral: Negative.  Negative for depression. The patient is not nervous/anxious and does not have insomnia.      Current treatment- s/p autologous stem cell transplant in November 2019 followed by [facility].  Currently on maintenance Revlimid 3 weeks on with 1 week off.  Zometa on hold due to dental infection.  No Known Allergies   Past Medical History:  Diagnosis Date   Hypertension    Multiple myeloma ([place])      Past Surgical History:  Procedure Laterality Date   IR FLUORO GUIDE PORT INSERTION RIGHT  12/16/2017    Social History   Socioeconomic History   Marital status: Single    Spouse name: Not on file   Number of children: Not on file   Years of education: Not on file   Highest education level: Not on file  Occupational History   Not on file  Social Needs   Financial resource strain: Not on file   Food insecurity    Worry: Not on file    Inability: Not on file   Transportation needs    Medical: Not on file    Non-medical: Not on file  Tobacco Use   Smoking status: Current Every Day Smoker    Packs/day: 0.50    Types: Cigarettes   Smokeless tobacco: Never Used  Substance and Sexual Activity   Alcohol use: No   Drug use: No   Sexual activity: Not on file  Lifestyle  Physical activity    Days per week: Not on file    Minutes per session: Not on file   Stress: Not on file  Relationships   Social connections    Talks on phone: Not on file    Gets together: Not on file    Attends religious service: Not on file    Active member of club or organization: Not on file    Attends meetings of clubs or organizations: Not on file    Relationship status: Not on file   Intimate partner violence    Fear of current or ex partner: Not on file    Emotionally abused: Not on file     Physically abused: Not on file    Forced sexual activity: Not on file  Other Topics Concern   Not on file  Social History Narrative   Lives at home with family. Independent at baseline    Family History  Problem Relation Age of Onset   Pneumonia Mother    Seizures Father      Current Outpatient Medications:    acyclovir (ZOVIRAX) 400 MG tablet, TAKE 1 TABLET BY MOUTH ONCE DAILY TO  PREVENT  SHINGLES, Disp: 30 tablet, Rfl: 0   albuterol (PROVENTIL HFA;VENTOLIN HFA) 108 (90 Base) MCG/ACT inhaler, Inhale 2 puffs into the lungs every 6 (six) hours as needed for wheezing or shortness of breath., Disp: 1 Inhaler, Rfl: 2   amLODipine (NORVASC) 10 MG tablet, Take 1 tablet (10 mg total) by mouth daily., Disp: 30 tablet, Rfl: 4   aspirin EC 325 MG tablet, Take 81 mg by mouth daily. , Disp: , Rfl:    ergocalciferol (VITAMIN D2) 1.25 MG (50000 UT) capsule, Take 1 capsule (50,000 Units total) by mouth once a week., Disp: 12 capsule, Rfl: 2   feeding supplement, ENSURE ENLIVE, (ENSURE ENLIVE) LIQD, Take 237 mLs by mouth 2 (two) times daily between meals., Disp: 60 Bottle, Rfl: 0   lenalidomide (REVLIMID) 10 MG capsule, Take 1 capsule (10 mg total) by mouth daily. Take for 21 days then hold for 7 days, Repeat every 28 days., Disp: 21 capsule, Rfl: 0   ondansetron (ZOFRAN) 8 MG tablet, One pill every 8 hours as needed for nausea/vomitting., Disp: 40 tablet, Rfl: 1   pantoprazole (PROTONIX) 40 MG tablet, Take 1 tablet (40 mg total) by mouth daily., Disp: 90 tablet, Rfl: 3   polyethylene glycol (MIRALAX / GLYCOLAX) packet, Take 17 g by mouth daily as needed for mild constipation., Disp: 14 each, Rfl: 0   prochlorperazine (COMPAZINE) 10 MG tablet, Take 1 tablet (10 mg total) by mouth every 6 (six) hours as needed for nausea or vomiting., Disp: 30 tablet, Rfl: 3   traMADol (ULTRAM) 50 MG tablet, Take 1 tablet (50 mg total) by mouth every 8 (eight) hours as needed., Disp: 60 tablet, Rfl:  0   potassium chloride SA (K-DUR) 20 MEQ tablet, Take 1 tablet (20 mEq total) by mouth once for 1 dose., Disp: 30 tablet, Rfl: 6 No current facility-administered medications for this visit.   Facility-Administered Medications Ordered in Other Visits:    sodium chloride flush (NS) 0.9 % injection 10 mL, 10 mL, Intracatheter, PRN, Charlaine Dalton R, MD, 10 mL at 02/23/18 1400  Physical exam:  Vitals:   02/20/19 1203  BP: 112/73  Pulse: 88  Resp: 18  Temp: (!) 96.9 F (36.1 C)  TempSrc: Tympanic  SpO2: 100%  Weight: 138 lb 1.6 oz (62.6 kg)   Physical Exam Constitutional:  Appearance: Normal appearance.  HENT:     Head: Normocephalic and atraumatic.  Eyes:     Pupils: Pupils are equal, round, and reactive to light.  Neck:     Musculoskeletal: Normal range of motion.  Cardiovascular:     Rate and Rhythm: Normal rate and regular rhythm.     Heart sounds: Normal heart sounds. No murmur.  Pulmonary:     Effort: Pulmonary effort is normal.     Breath sounds: Normal breath sounds. No wheezing.  Abdominal:     General: Bowel sounds are normal. There is no distension.     Palpations: Abdomen is soft.     Tenderness: There is no abdominal tenderness.  Musculoskeletal: Normal range of motion.  Skin:    General: Skin is warm and dry.     Findings: No rash.  Neurological:     Mental Status: She is alert and oriented to person, place, and time.  Psychiatric:        Judgment: Judgment normal.      CMP Latest Ref Rng & Units 02/20/2019  Glucose 70 - 99 mg/dL 113(H)  BUN 8 - 23 mg/dL 10  Creatinine 0.44 - 1.00 mg/dL 0.56  Sodium 135 - 145 mmol/L 139  Potassium 3.5 - 5.1 mmol/L 3.3(L)  Chloride 98 - 111 mmol/L 106  CO2 22 - 32 mmol/L 22  Calcium 8.9 - 10.3 mg/dL 9.1  Total Protein 6.5 - 8.1 g/dL 7.0  Total Bilirubin 0.3 - 1.2 mg/dL 0.7  Alkaline Phos 38 - 126 U/L 206(H)  AST 15 - 41 U/L 48(H)  ALT 0 - 44 U/L 54(H)   CBC Latest Ref Rng & Units 02/20/2019  WBC 4.0 -  10.5 K/uL 5.2  Hemoglobin 12.0 - 15.0 g/dL 11.1(L)  Hematocrit 36.0 - 46.0 % 35.0(L)  Platelets 150 - 400 K/uL 218    No images are attached to the encounter.  Dg Chest 2 View  Result Date: 02/20/2019 CLINICAL DATA:  Multiple myeloma.  Shortness of breath. EXAM: CHEST - 2 VIEW COMPARISON:  Chest x-ray 08/30/2018 FINDINGS: The cardiac silhouette, mediastinal and hilar contours are within normal limits and stable. There is tortuosity and calcification of the thoracic aorta. The lungs are clear of an acute process. No infiltrates, edema or effusions. No worrisome pulmonary lesions. The bony thorax is intact. No obvious lytic myelomatous lesions are identified. There is a healed left fifth anterior rib fracture. IMPRESSION: No acute cardiopulmonary findings. No significant bony findings. Electronically Signed   By: Marijo Sanes M.D.   On: 02/20/2019 11:42   Assessment and plan- Patient is a 68 y.o. female who presents to [facility] for concerns of acute shortness of breath, dysphasia and bilateral lower extremity swelling.   Multiple myeloma: s/p autologous stem cell transplant at [facility] in November 2019.  Currently on maintenance Revlimid tolerating well.  Began her Revlimid cycle last week.  Zometa on hold due to dental infection.  Multiple myeloma labs are within normal range at this time.  She is scheduled to return to clinic next month for further evaluation.   Shortness of breath: Stat chest x-ray and review labs.  Chest x-ray negative labs unremarkable.  Unclear etiology; could several different factors including heat, dehydration/malnutrition due to dysphasia or anemia.   Hypokalemia: Continue potassium supplement as prescribed.  Weight loss: Likely related to GERD and dysphagia. Start taking Protonix as prescribed.  She may need dietitian consult in the future if symptoms are persistent.   Elevated alk phos: Medication  induced versus reactive d/t esophagitis/dysphasia.  Continue to  monitor  Elevated liver enzymes: Medication induced?  Revlimid.   Bilateral lower extremity swelling: Mild edema present.  Recommend elevation when sitting and at bedtime.  She can apply TED hose if swelling persists or worsens.  Plan: Stat chest x-ray.  Negative for acute process. Vital signs.  Stable.  Stat lab work.  Elevated alk phos and liver enzymes.  All others were okay. Review medications to ensure compliance.  Disposition: Return to clinic in approximately 2 to 3 weeks for repeat lab work and assessment. I have asked her to collect all of her medications and to bring them to her next scheduled visit to make sure she is taking appropriate medications. Continue potassium supplements. Start taking Protonix 40 mg daily as previously prescribed. She can take Pepcid as needed to see if this helps with GERD symptoms.  We reviewed GERD precautions including to drink fluids with meals and to sit upright for 30 minutes after consuming a meal. She was given several different supplementation product such as Ensure and electrolyte hydration powders as an alternative to eating a meal. We will touch base with patient later this week to see if symptoms are improving.   Visit Diagnosis 1. Multiple myeloma in relapse ([place])   2. Shortness of breath    Patient expressed understanding and was in agreement with this plan. She also understands that She can call clinic at any time with any questions, concerns, or complaints.   Greater than 50% was spent in counseling and coordination of care with this patient including but not limited to discussion of the relevant topics above (See A&P) including, but not limited to diagnosis and management of acute and chronic medical conditions.   Thank you for allowing me to participate in the care of this very pleasant patient.    Jacquelin Hawking, NP [place] [facility] - [phone] Pager- [phone] 02/20/2019 2:03 PM

## 2019-02-20 NOTE — Progress Notes (Unsigned)
Chest  

## 2019-02-21 LAB — KAPPA/LAMBDA LIGHT CHAINS
Kappa free light chain: 15.7 mg/L (ref 3.3–19.4)
Kappa, lambda light chain ratio: 0.93 (ref 0.26–1.65)
Lambda free light chains: 16.9 mg/L (ref 5.7–26.3)

## 2019-02-22 LAB — MULTIPLE MYELOMA PANEL, SERUM
Albumin SerPl Elph-Mcnc: 3.2 g/dL (ref 2.9–4.4)
Albumin/Glob SerPl: 1.2 (ref 0.7–1.7)
Alpha 1: 0.2 g/dL (ref 0.0–0.4)
Alpha2 Glob SerPl Elph-Mcnc: 0.9 g/dL (ref 0.4–1.0)
B-Globulin SerPl Elph-Mcnc: 0.8 g/dL (ref 0.7–1.3)
Gamma Glob SerPl Elph-Mcnc: 0.8 g/dL (ref 0.4–1.8)
Globulin, Total: 2.7 g/dL (ref 2.2–3.9)
IgA: 112 mg/dL (ref 87–352)
IgG (Immunoglobin G), Serum: 946 mg/dL (ref 586–1602)
IgM (Immunoglobulin M), Srm: 24 mg/dL — ABNORMAL LOW (ref 26–217)
Total Protein ELP: 5.9 g/dL — ABNORMAL LOW (ref 6.0–8.5)

## 2019-02-23 ENCOUNTER — Telehealth: Payer: Self-pay | Admitting: *Deleted

## 2019-02-23 NOTE — Telephone Encounter (Signed)
Brandi [name] will contact the patient.

## 2019-02-23 NOTE — Telephone Encounter (Signed)
When did the patient call? She was last seen in [facility] on 7/28

## 2019-02-23 NOTE — Telephone Encounter (Signed)
Spoke to patient and she appears to be doing better.  She is taking her Protonix as directed.  She is eating more foods and having less nausea.  She does not feel 100% but feels she is improving.  I will touch base with her again on Monday to see how her weekend was.

## 2019-02-23 NOTE — Telephone Encounter (Signed)
Daughter called stating no one has called back regarding patients difficulty eating and vomiting. Please advise

## 2019-02-23 NOTE — Telephone Encounter (Signed)
Brandi [name]'s note said someone would call later in the week to see how she is doing, I guess that is what she is referring to because I have not had any calls from her before now

## 2019-03-06 ENCOUNTER — Other Ambulatory Visit: Payer: Self-pay

## 2019-03-07 ENCOUNTER — Inpatient Hospital Stay: Payer: Medicare Other

## 2019-03-07 ENCOUNTER — Telehealth: Payer: Self-pay | Admitting: *Deleted

## 2019-03-07 ENCOUNTER — Inpatient Hospital Stay: Payer: Medicare Other | Admitting: Internal Medicine

## 2019-03-07 NOTE — Assessment & Plan Note (Deleted)
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  May 2020 myeloma markers-absent myeloma protein/normal kappa lambda light chain ratio.   # Continue with maintenance Revlimid 10 mg 3 weeks on 1 week off.  Tolerating well  # Bone lesions/pain stable-last zometa on 11/27/2017. HOLD Zometa sec to poor dentition. Recommend dental evaluation.   # Right jugular DVT-status post Xarelto finished April 2020. Improved;  On baby aspirin.  #Infection prophylaxis continue acyclovir- will get vaccinations at [facility] in Nov 2020  # DISPOSITION: # HOLD zometa # referral to dentist re: poor dentition # follow up in 4 weeks; MD- labs- cbc/cmp/MM panel;K-lmabda light chains- Dr.B 

## 2019-03-07 NOTE — Progress Notes (Deleted)
Brandi [name] OFFICE PROGRESS NOTE  Patient Care Team: Center, [facility] as PCP - General ([place]) [name], [name]  Cancer Staging No matching staging information was found for the patient.   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     Multiple [place] in relapse ([facility] 11/07/2017 -  Chemotherapy   The patient had palonosetron (ALOXI) injection 0.25 mg, 0.25 mg, Intravenous,  Once, 6 of 7 cycles Administration: 0.25 mg (11/21/2017), 0.25 mg (11/29/2017), 0.25 mg (12/05/2017), 0.25 mg (12/21/2017), 0.25 mg (12/28/2017), 0.25 mg (01/04/2018), 0.25 mg (01/18/2018), 0.25 mg (01/25/2018), 0.25 mg (02/01/2018), 0.25 mg (02/15/2018), 0.25 mg (02/22/2018), 0.25 mg (03/01/2018), 0.25 mg (03/15/2018), 0.25 mg (03/22/2018),  0.25 mg (03/30/2018), 0.25 mg (04/13/2018), 0.25 mg (04/20/2018), 0.25 mg (04/27/2018) cyclophosphamide (CYTOXAN) 500 mg in sodium chloride 0.9 % 250 mL chemo infusion, 540 mg, Intravenous,  Once, 6 of 7 cycles Administration: 500 mg (11/21/2017), 500 mg (11/29/2017), 500 mg (12/05/2017), 500 mg (12/28/2017), 500 mg (01/04/2018), 500 mg (01/18/2018), 500 mg (01/25/2018), 500 mg (02/01/2018), 500 mg (02/15/2018), 500 mg (02/22/2018), 500 mg (03/01/2018), 500 mg (03/15/2018), 500 mg (03/30/2018), 500 mg (04/13/2018), 500 mg (04/20/2018), 500 mg (04/27/2018) carfilzomib (KYPROLIS) 36 mg in dextrose 5 % 50 mL chemo infusion, 20 mg/m2 = 36 mg, Intravenous, Once, 6 of 7 cycles Administration: 36 mg (11/21/2017), 36 mg (11/22/2017), 60 mg (11/29/2017), 60 mg (11/30/2017), 60 mg (12/05/2017), 60 mg (12/06/2017), 60 mg (12/21/2017), 60 mg (12/22/2017), 60 mg (12/28/2017), 60 mg (12/29/2017), 60 mg (01/04/2018), 60 mg (01/05/2018), 60 mg (01/18/2018), 60 mg (01/19/2018), 60 mg (01/25/2018), 60 mg (01/27/2018), 60 mg (02/01/2018), 60 mg (02/02/2018), 60 mg (02/15/2018), 60 mg (02/16/2018), 60 mg (02/22/2018), 60 mg (03/01/2018), 60 mg (03/02/2018), 60 mg (03/15/2018), 60 mg (03/16/2018), 60 mg (03/23/2018), 60 mg (03/30/2018), 60 mg (03/31/2018), 60 mg (04/13/2018), 60 mg (04/14/2018), 60 mg (04/20/2018), 60 mg (04/21/2018), 60 mg (04/27/2018)  for chemotherapy treatment.        INTERVAL HISTORY:  Brandi [age]ITER89.o.  female pleasant patient above history of relapsed multiple myeloma-currently on maintenance Revlimid.  Patient denies any nausea vomiting but denies any fevers or chills or rash.  Denies any bone pain.  Complains of broken tooth.  No pain.  Review of Systems  Constitutional: Negative for chills, diaphoresis, fever and weight loss.  HENT: Negative for nosebleeds and sore throat.   Eyes: Negative for double vision.  Respiratory: Negative for hemoptysis, sputum production and shortness of breath.   Cardiovascular: Negative for chest pain, palpitations and  orthopnea.  Gastrointestinal: Negative for abdominal pain, blood in stool, constipation, heartburn, melena and vomiting.  Genitourinary: Negative for dysuria, frequency and urgency.  Skin: Negative.  Negative for itching and rash.  Neurological: Negative for dizziness, tingling, focal weakness, weakness and headaches.  Endo/Heme/Allergies: Does not bruise/bleed easily.  Psychiatric/Behavioral: Negative for depression. The patient is not nervous/anxious and does not have insomnia.     PAST MEDICAL HISTORY :  Past Medical History:  Diagnosis Date  . Hypertension   . Multiple myeloma ([place])     PAST SURGICAL HISTORY :   Past Surgical History:  Procedure Laterality Date  . IR FLUORO GUIDE PORT INSERTION RIGHT  12/16/2017    FAMILY HISTORY :   Family History  Problem Relation Age of Onset  . Pneumonia Mother   . Seizures Father     SOCIAL HISTORY:   Social History   Tobacco Use  . Smoking status: Current Every Day Smoker    Packs/day: 0.50    Types: Cigarettes  . Smokeless tobacco: Never Used  Substance Use Topics  . Alcohol use: No  . Drug use: No    ALLERGIES:  has No Known Allergies.  MEDICATIONS:  Current Outpatient Medications  Medication Sig Dispense Refill  . acyclovir (ZOVIRAX) 400 MG tablet TAKE 1 TABLET BY MOUTH ONCE DAILY TO  PREVENT  SHINGLES 30 tablet 0  . albuterol (PROVENTIL HFA;VENTOLIN HFA) 108 (90 Base) MCG/ACT inhaler Inhale 2 puffs into the lungs every 6 (six) hours as needed for wheezing or shortness of breath. 1 Inhaler 2  . amLODipine (NORVASC) 10 MG tablet Take 1 tablet (10 mg total) by mouth daily. 30 tablet 4  . aspirin EC 325 MG tablet Take 81 mg by mouth daily.     . ergocalciferol (VITAMIN D2) 1.25 MG (50000 UT) capsule Take 1 capsule (50,000 Units total) by mouth once a week. 12 capsule 2  . feeding supplement, ENSURE ENLIVE, (ENSURE ENLIVE) LIQD Take 237 mLs by mouth 2 (two) times daily between meals. 60 Bottle 0  . lenalidomide (REVLIMID)  10 MG capsule Take 1 capsule (10 mg total) by mouth daily. Take for 21 days then hold for 7 days, Repeat every 28 days. 21 capsule 0  . ondansetron (ZOFRAN) 8 MG tablet One pill every 8 hours as needed for nausea/vomitting. 40 tablet 1  . pantoprazole (PROTONIX) 40 MG tablet Take 1 tablet (40 mg total) by mouth daily. 90 tablet 3  . polyethylene glycol (MIRALAX / GLYCOLAX) packet Take 17 g by mouth daily as needed for mild constipation. 14 each 0  . potassium chloride SA (K-DUR) 20 MEQ tablet Take 1 tablet (20 mEq total) by mouth once for 1 dose. 30 tablet 6  . prochlorperazine (COMPAZINE) 10 MG tablet Take 1 tablet (10 mg total) by mouth every 6 (six) hours as needed for nausea or vomiting. 30 tablet 3  . traMADol (ULTRAM) 50 MG tablet Take 1 tablet (50 mg total) by mouth every 8 (eight) hours as needed. 60 tablet 0   No current facility-administered medications for this visit.    Facility-Administered Medications Ordered in Other Visits  Medication Dose Route Frequency Provider Last Rate Last Dose  . sodium chloride flush (NS) 0.9 % injection 10 mL  10 mL Intracatheter PRN [name], MD   10 mL at 02/23/18 1400    PHYSICAL EXAMINATION: ECOG PERFORMANCE STATUS: 1 - Symptomatic but completely ambulatory  There were no vitals taken for this visit.  There were no vitals filed for  this visit.  Physical Exam  Constitutional: She is oriented to person, place, and time and well-developed, well-nourished, and in no distress.  Alone. She is walking herself.  HENT:  Head: Normocephalic and atraumatic.  Poor dentition.  Eyes: Pupils are equal, round, and reactive to light.  Neck: Normal range of motion. Neck supple.  Cardiovascular: Normal rate and regular rhythm.  Pulmonary/Chest: Effort normal and breath sounds normal. No respiratory distress. She has no wheezes.  Abdominal: Soft. Bowel sounds are normal. She exhibits no distension and no mass. There is no abdominal tenderness.  There is no rebound and no guarding.  Musculoskeletal: Normal range of motion.        General: No tenderness.  Neurological: She is alert and oriented to person, place, and time.  Skin: Skin is warm.  Psychiatric: Affect normal.   LABORATORY DATA:  I have reviewed the data as listed    Component Value Date/Time   NA 139 02/20/2019 1144   K 3.3 (L) 02/20/2019 1144   CL 106 02/20/2019 1144   CO2 22 02/20/2019 1144   GLUCOSE 113 (H) 02/20/2019 1144   BUN 10 02/20/2019 1144   CREATININE 0.56 02/20/2019 1144   CALCIUM 9.1 02/20/2019 1144   PROT 7.0 02/20/2019 1144   ALBUMIN 3.6 02/20/2019 1144   AST 48 (H) 02/20/2019 1144   ALT 54 (H) 02/20/2019 1144   ALKPHOS 206 (H) 02/20/2019 1144   BILITOT 0.7 02/20/2019 1144   GFRNONAA >60 02/20/2019 1144   GFRAA >60 02/20/2019 1144    No results found for: SPEP, UPEP  Lab Results  Component Value Date   WBC 5.2 02/20/2019   NEUTROABS 2.2 02/20/2019   HGB 11.1 (L) 02/20/2019   HCT 35.0 (L) 02/20/2019   MCV 89.7 02/20/2019   PLT 218 02/20/2019      Chemistry      Component Value Date/Time   NA 139 02/20/2019 1144   K 3.3 (L) 02/20/2019 1144   CL 106 02/20/2019 1144   CO2 22 02/20/2019 1144   BUN 10 02/20/2019 1144   CREATININE 0.56 02/20/2019 1144      Component Value Date/Time   CALCIUM 9.1 02/20/2019 1144   ALKPHOS 206 (H) 02/20/2019 1144   AST 48 (H) 02/20/2019 1144   ALT 54 (H) 02/20/2019 1144   BILITOT 0.7 02/20/2019 1144       RADIOGRAPHIC STUDIES: I have personally reviewed the radiological images as listed and agreed with the findings in the report. No results found.   ASSESSMENT & PLAN:  No problem-specific Assessment & Plan notes found for this encounter.  No orders of the defined types were placed in this encounter.  All questions were answered. The patient knows to call the clinic with any problems, questions or concerns.      [name], MD 03/07/2019 8:22 AM

## 2019-03-07 NOTE — Telephone Encounter (Signed)
Called and left vm regarding patient's no show for today's visit. Requested patient to return my call.

## 2019-03-07 NOTE — Telephone Encounter (Signed)
Spoke with patient. apts r/s for tomorrow in clinic with Dr. Nat Math with labs prior to visit.

## 2019-03-08 ENCOUNTER — Inpatient Hospital Stay: Payer: Medicare Other

## 2019-03-08 ENCOUNTER — Other Ambulatory Visit: Payer: Self-pay

## 2019-03-08 ENCOUNTER — Encounter: Payer: Self-pay | Admitting: Internal Medicine

## 2019-03-08 ENCOUNTER — Inpatient Hospital Stay: Payer: Medicare Other | Attending: Internal Medicine | Admitting: Internal Medicine

## 2019-03-08 VITALS — BP 119/68 | HR 83 | Temp 98.3°F | Resp 18 | Wt 138.2 lb

## 2019-03-08 DIAGNOSIS — R7989 Other specified abnormal findings of blood chemistry: Secondary | ICD-10-CM | POA: Insufficient documentation

## 2019-03-08 DIAGNOSIS — R1013 Epigastric pain: Secondary | ICD-10-CM

## 2019-03-08 DIAGNOSIS — Z79899 Other long term (current) drug therapy: Secondary | ICD-10-CM | POA: Diagnosis not present

## 2019-03-08 DIAGNOSIS — Z9484 Stem cells transplant status: Secondary | ICD-10-CM | POA: Diagnosis not present

## 2019-03-08 DIAGNOSIS — F17210 Nicotine dependence, cigarettes, uncomplicated: Secondary | ICD-10-CM | POA: Insufficient documentation

## 2019-03-08 DIAGNOSIS — R110 Nausea: Secondary | ICD-10-CM | POA: Insufficient documentation

## 2019-03-08 DIAGNOSIS — C9002 Multiple myeloma in relapse: Secondary | ICD-10-CM

## 2019-03-08 DIAGNOSIS — R1310 Dysphagia, unspecified: Secondary | ICD-10-CM

## 2019-03-08 DIAGNOSIS — R0602 Shortness of breath: Secondary | ICD-10-CM | POA: Insufficient documentation

## 2019-03-08 DIAGNOSIS — R630 Anorexia: Secondary | ICD-10-CM | POA: Diagnosis not present

## 2019-03-08 DIAGNOSIS — I10 Essential (primary) hypertension: Secondary | ICD-10-CM | POA: Insufficient documentation

## 2019-03-08 LAB — CBC WITH DIFFERENTIAL/PLATELET
Abs Immature Granulocytes: 0.01 10*3/uL (ref 0.00–0.07)
Basophils Absolute: 0 10*3/uL (ref 0.0–0.1)
Basophils Relative: 1 %
Eosinophils Absolute: 0.8 10*3/uL — ABNORMAL HIGH (ref 0.0–0.5)
Eosinophils Relative: 12 %
HCT: 29.9 % — ABNORMAL LOW (ref 36.0–46.0)
Hemoglobin: 9.4 g/dL — ABNORMAL LOW (ref 12.0–15.0)
Immature Granulocytes: 0 %
Lymphocytes Relative: 55 %
Lymphs Abs: 3.5 10*3/uL (ref 0.7–4.0)
MCH: 27.8 pg (ref 26.0–34.0)
MCHC: 31.4 g/dL (ref 30.0–36.0)
MCV: 88.5 fL (ref 80.0–100.0)
Monocytes Absolute: 0.4 10*3/uL (ref 0.1–1.0)
Monocytes Relative: 6 %
Neutro Abs: 1.7 10*3/uL (ref 1.7–7.7)
Neutrophils Relative %: 26 %
Platelets: 177 10*3/uL (ref 150–400)
RBC: 3.38 MIL/uL — ABNORMAL LOW (ref 3.87–5.11)
RDW: 13.9 % (ref 11.5–15.5)
WBC: 6.3 10*3/uL (ref 4.0–10.5)
nRBC: 0 % (ref 0.0–0.2)

## 2019-03-08 LAB — COMPREHENSIVE METABOLIC PANEL
ALT: 55 U/L — ABNORMAL HIGH (ref 0–44)
AST: 50 U/L — ABNORMAL HIGH (ref 15–41)
Albumin: 3.1 g/dL — ABNORMAL LOW (ref 3.5–5.0)
Alkaline Phosphatase: 210 U/L — ABNORMAL HIGH (ref 38–126)
Anion gap: 8 (ref 5–15)
BUN: 10 mg/dL (ref 8–23)
CO2: 23 mmol/L (ref 22–32)
Calcium: 8.9 mg/dL (ref 8.9–10.3)
Chloride: 110 mmol/L (ref 98–111)
Creatinine, Ser: 0.39 mg/dL — ABNORMAL LOW (ref 0.44–1.00)
GFR calc Af Amer: >60 mL/min (ref >60)
GFR calc non Af Amer: >60 mL/min (ref >60)
Glucose, Bld: 101 mg/dL — ABNORMAL HIGH (ref 70–99)
Potassium: 2.9 mmol/L — ABNORMAL LOW (ref 3.5–5.1)
Sodium: 141 mmol/L (ref 135–145)
Total Bilirubin: 0.7 mg/dL (ref 0.3–1.2)
Total Protein: 6 g/dL — ABNORMAL LOW (ref 6.5–8.1)

## 2019-03-08 NOTE — Assessment & Plan Note (Addendum)
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  July 2020 myeloma markers-absent myeloma protein/normal kappa lambda light chain ratio.   #Hold Revlimid maintenance-because of feeling poorly/see discussion below  # Dysphagia-unclear etiology.  No prior EGD.  Recommend esophagogram.  # Elevated LFTs- Stop acvlovir-Revlimid /check hepatitis panel/ US abdomen ASAP  # DIB/ cough- recommend quitting smoking; recent chest x-ray negative.  # Bone lesions/pain stable last zometa on 11/27/2017.  Holding Zometa secondary to poor dentition.  #Infection prophylaxis-hold acyclovir because of elevated LFTs.  Will get vaccinations at [facility] in Nov 2020  # DISPOSITION: # esophagragram/US abdomen asap # follow up in 2 weeks; MD- labs- cbc/cmp- Dr.B

## 2019-03-08 NOTE — Progress Notes (Signed)
[place] OFFICE PROGRESS NOTE  Patient Care Team: Center, [facility] as PCP - General ([place]) [name], [name]  Cancer Staging No matching staging information was found for the patient.   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     Multiple myeloma in relapse ([facility] 11/07/2017 -  Chemotherapy   The patient had palonosetron (ALOXI) injection 0.25 mg, 0.25 mg, Intravenous,  Once, 6 of 7 cycles Administration: 0.25 mg (11/21/2017), 0.25 mg (11/29/2017), 0.25 mg (12/05/2017), 0.25 mg (12/21/2017), 0.25 mg (12/28/2017), 0.25 mg (01/04/2018), 0.25 mg (01/18/2018), 0.25 mg (01/25/2018), 0.25 mg (02/01/2018), 0.25 mg (02/15/2018), 0.25 mg (02/22/2018), 0.25 mg (03/01/2018), 0.25 mg (03/15/2018), 0.25 mg (03/22/2018),  0.25 mg (03/30/2018), 0.25 mg (04/13/2018), 0.25 mg (04/20/2018), 0.25 mg (04/27/2018) cyclophosphamide (CYTOXAN) 500 mg in sodium chloride 0.9 % 250 mL chemo infusion, 540 mg, Intravenous,  Once, 6 of 7 cycles Administration: 500 mg (11/21/2017), 500 mg (11/29/2017), 500 mg (12/05/2017), 500 mg (12/28/2017), 500 mg (01/04/2018), 500 mg (01/18/2018), 500 mg (01/25/2018), 500 mg (02/01/2018), 500 mg (02/15/2018), 500 mg (02/22/2018), 500 mg (03/01/2018), 500 mg (03/15/2018), 500 mg (03/30/2018), 500 mg (04/13/2018), 500 mg (04/20/2018), 500 mg (04/27/2018) carfilzomib (KYPROLIS) 36 mg in dextrose 5 % 50 mL chemo infusion, 20 mg/m2 = 36 mg, Intravenous, Once, 6 of 7 cycles Administration: 36 mg (11/21/2017), 36 mg (11/22/2017), 60 mg (11/29/2017), 60 mg (11/30/2017), 60 mg (12/05/2017), 60 mg (12/06/2017), 60 mg (12/21/2017), 60 mg (12/22/2017), 60 mg (12/28/2017), 60 mg (12/29/2017), 60 mg (01/04/2018), 60 mg (01/05/2018), 60 mg (01/18/2018), 60 mg (01/19/2018), 60 mg (01/25/2018), 60 mg (01/27/2018), 60 mg (02/01/2018), 60 mg (02/02/2018), 60 mg (02/15/2018), 60 mg (02/16/2018), 60 mg (02/22/2018), 60 mg (03/01/2018), 60 mg (03/02/2018), 60 mg (03/15/2018), 60 mg (03/16/2018), 60 mg (03/23/2018), 60 mg (03/30/2018), 60 mg (03/31/2018), 60 mg (04/13/2018), 60 mg (04/14/2018), 60 mg (04/20/2018), 60 mg (04/21/2018), 60 mg (04/27/2018)  for chemotherapy treatment.        INTERVAL HISTORY:  Brandi KESSENICH89.o.  female pleasant patient above history of relapsed multiple myeloma-currently on maintenance Revlimid.  Patient complains of poor appetite over the last 2 weeks.  She lost about 8 pounds in the last 2 months.  She complains of food getting stuck in the throat.  Complains of decreased taste in both solids and liquids.  Complains of upper abdominal discomfort.  Positive for nausea no vomiting.  Denies any bone pain.  Denies any fevers or chills.  Denies any exposure to covid-19.   Review of Systems  Constitutional: Positive  for malaise/fatigue and weight  loss. Negative for chills, diaphoresis and fever.  HENT: Negative for nosebleeds and sore throat.   Eyes: Negative for double vision.  Respiratory: Negative for hemoptysis, sputum production and shortness of breath.   Cardiovascular: Negative for chest pain, palpitations and orthopnea.  Gastrointestinal: Positive for nausea and vomiting. Negative for abdominal pain, blood in stool, constipation, heartburn and melena.  Genitourinary: Negative for dysuria, frequency and urgency.  Skin: Negative.  Negative for itching and rash.  Neurological: Negative for dizziness, tingling, focal weakness, weakness and headaches.  Endo/Heme/Allergies: Does not bruise/bleed easily.  Psychiatric/Behavioral: Negative for depression. The patient is not nervous/anxious and does not have insomnia.     PAST MEDICAL HISTORY :  Past Medical History:  Diagnosis Date  . Hypertension   . Multiple myeloma ([place])     PAST SURGICAL HISTORY :   Past Surgical History:  Procedure Laterality Date  . IR FLUORO GUIDE PORT INSERTION RIGHT  12/16/2017    FAMILY HISTORY :   Family History  Problem Relation Age of Onset  . Pneumonia Mother   . Seizures Father     SOCIAL HISTORY:   Social History   Tobacco Use  . Smoking status: Current Every Day Smoker    Packs/day: 0.50    Types: Cigarettes  . Smokeless tobacco: Never Used  Substance Use Topics  . Alcohol use: No  . Drug use: No    ALLERGIES:  has No Known Allergies.  MEDICATIONS:  Current Outpatient Medications  Medication Sig Dispense Refill  . acyclovir (ZOVIRAX) 400 MG tablet TAKE 1 TABLET BY MOUTH ONCE DAILY TO  PREVENT  SHINGLES 30 tablet 0  . albuterol (PROVENTIL HFA;VENTOLIN HFA) 108 (90 Base) MCG/ACT inhaler Inhale 2 puffs into the lungs every 6 (six) hours as needed for wheezing or shortness of breath. 1 Inhaler 2  . amLODipine (NORVASC) 10 MG tablet Take 1 tablet (10 mg total) by mouth daily. 30 tablet 4  . aspirin EC 325 MG tablet Take 81 mg by  mouth daily.     . ergocalciferol (VITAMIN D2) 1.25 MG (50000 UT) capsule Take 1 capsule (50,000 Units total) by mouth once a week. 12 capsule 2  . feeding supplement, ENSURE ENLIVE, (ENSURE ENLIVE) LIQD Take 237 mLs by mouth 2 (two) times daily between meals. 60 Bottle 0  . lenalidomide (REVLIMID) 10 MG capsule Take 1 capsule (10 mg total) by mouth daily. Take for 21 days then hold for 7 days, Repeat every 28 days. 21 capsule 0  . ondansetron (ZOFRAN) 8 MG tablet One pill every 8 hours as needed for nausea/vomitting. 40 tablet 1  . pantoprazole (PROTONIX) 40 MG tablet Take 1 tablet (40 mg total) by mouth daily. 90 tablet 3  . polyethylene glycol (MIRALAX / GLYCOLAX) packet Take 17 g by mouth daily as needed for mild constipation. 14 each 0  . prochlorperazine (COMPAZINE) 10 MG tablet Take 1 tablet (10 mg total) by mouth every 6 (six) hours as needed for nausea or vomiting. 30 tablet 3  . traMADol (ULTRAM) 50 MG tablet Take 1 tablet (50 mg total) by mouth every 8 (eight) hours as needed. 60 tablet 0  . potassium chloride SA (K-DUR) 20 MEQ tablet Take 1 tablet (20 mEq total) by mouth once for 1 dose. 30 tablet 6   No current facility-administered medications for this visit.    Facility-Administered Medications Ordered in Other Visits  Medication Dose Route Frequency Provider Last Rate Last Dose  . sodium chloride  flush (NS) 0.9 % injection 10 mL  10 mL Intracatheter PRN [name], MD   10 mL at 02/23/18 1400    PHYSICAL EXAMINATION: ECOG PERFORMANCE STATUS: 1 - Symptomatic but completely ambulatory  BP 119/68 (BP Location: Left Arm, Patient Position: Sitting)   Pulse 83   Temp 98.3 F (36.8 C) (Tympanic)   Resp 18   Wt 138 lb 3.2 oz (62.7 kg)   BMI 26.11 kg/m   Filed Weights   03/08/19 0932  Weight: 138 lb 3.2 oz (62.7 kg)    Physical Exam  Constitutional: She is oriented to person, place, and time and well-developed, well-nourished, and in no distress.  Alone. She is  walking herself.  HENT:  Head: Normocephalic and atraumatic.  Poor dentition.  Eyes: Pupils are equal, round, and reactive to light.  Neck: Normal range of motion. Neck supple.  Cardiovascular: Normal rate and regular rhythm.  Pulmonary/Chest: Effort normal and breath sounds normal. No respiratory distress. She has no wheezes.  Abdominal: Soft. Bowel sounds are normal. She exhibits no distension and no mass. There is no abdominal tenderness. There is no rebound and no guarding.  Musculoskeletal: Normal range of motion.        General: No tenderness.  Neurological: She is alert and oriented to person, place, and time.  Skin: Skin is warm.  Psychiatric: Affect normal.   LABORATORY DATA:  I have reviewed the data as listed    Component Value Date/Time   NA 141 03/08/2019 0919   K 2.9 (L) 03/08/2019 0919   CL 110 03/08/2019 0919   CO2 23 03/08/2019 0919   GLUCOSE 101 (H) 03/08/2019 0919   BUN 10 03/08/2019 0919   CREATININE 0.39 (L) 03/08/2019 0919   CALCIUM 8.9 03/08/2019 0919   PROT 6.0 (L) 03/08/2019 0919   ALBUMIN 3.1 (L) 03/08/2019 0919   AST 50 (H) 03/08/2019 0919   ALT 55 (H) 03/08/2019 0919   ALKPHOS 210 (H) 03/08/2019 0919   BILITOT 0.7 03/08/2019 0919   GFRNONAA >60 03/08/2019 0919   GFRAA >60 03/08/2019 0919    No results found for: SPEP, UPEP  Lab Results  Component Value Date   WBC 6.3 03/08/2019   NEUTROABS 1.7 03/08/2019   HGB 9.4 (L) 03/08/2019   HCT 29.9 (L) 03/08/2019   MCV 88.5 03/08/2019   PLT 177 03/08/2019      Chemistry      Component Value Date/Time   NA 141 03/08/2019 0919   K 2.9 (L) 03/08/2019 0919   CL 110 03/08/2019 0919   CO2 23 03/08/2019 0919   BUN 10 03/08/2019 0919   CREATININE 0.39 (L) 03/08/2019 0919      Component Value Date/Time   CALCIUM 8.9 03/08/2019 0919   ALKPHOS 210 (H) 03/08/2019 0919   AST 50 (H) 03/08/2019 0919   ALT 55 (H) 03/08/2019 0919   BILITOT 0.7 03/08/2019 0919       RADIOGRAPHIC STUDIES: I have  personally reviewed the radiological images as listed and agreed with the findings in the report. No results found.   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  July 2020 myeloma markers-absent myeloma protein/normal kappa lambda light chain ratio.   #Hold Revlimid maintenance-because of feeling poorly/see discussion below  # Dysphagia-unclear etiology.  No prior EGD.  Recommend esophagogram.  # Elevated LFTs- Stop acvlovir-Revlimid /check hepatitis panel/ US abdomen ASAP  #  DIB/ cough- recommend quitting smoking; recent chest x-ray negative.  # Bone lesions/pain stable last zometa on 11/27/2017.  Holding Zometa secondary to poor dentition.  #Infection prophylaxis-hold acyclovir because of elevated LFTs.  Will get vaccinations at [facility] in Nov 2020  # DISPOSITION: # esophagragram/US abdomen asap # follow up in 2 weeks; MD- labs- cbc/cmp- Dr.B  Orders Placed This Encounter  Procedures  . DG ESOPHAGUS W SINGLE CM (SOL OR THIN BA)    Standing Status:   Future    Standing Expiration Date:   05/07/2020    Order Specific Question:   Reason for Exam (SYMPTOM  OR DIAGNOSIS REQUIRED)    Answer:   dysphagia    Order Specific Question:   Preferred imaging location?    Answer:   [facility]  . US Abdomen Complete    Standing Status:   Future    Standing Expiration Date:   03/07/2020    Order Specific Question:   Reason for Exam (SYMPTOM  OR DIAGNOSIS REQUIRED)    Answer:   abdominal pain    Order Specific Question:   Preferred imaging location?    Answer:   [facility]  . CBC with Differential    Standing Status:   Future    Standing Expiration Date:   03/07/2020  . Comprehensive metabolic panel    Standing Status:   Future    Standing Expiration Date:   03/07/2020   All questions were answered. The patient knows to call the clinic  with any problems, questions or concerns.      [name], MD 03/11/2019 7:04 PM

## 2019-03-08 NOTE — Patient Instructions (Signed)
#   STOP REVLIMID # STOP ACYCLOVIR

## 2019-03-14 ENCOUNTER — Other Ambulatory Visit: Payer: Self-pay | Admitting: Internal Medicine

## 2019-03-14 ENCOUNTER — Ambulatory Visit
Admission: RE | Admit: 2019-03-14 | Discharge: 2019-03-14 | Disposition: A | Discharge status: Home / Self Care | Payer: Medicare Other | Source: Ambulatory Visit | Attending: Internal Medicine | Admitting: Internal Medicine

## 2019-03-14 ENCOUNTER — Other Ambulatory Visit: Payer: Self-pay

## 2019-03-14 DIAGNOSIS — C9002 Multiple myeloma in relapse: Secondary | ICD-10-CM

## 2019-03-14 DIAGNOSIS — R1013 Epigastric pain: Secondary | ICD-10-CM

## 2019-03-14 DIAGNOSIS — R1310 Dysphagia, unspecified: Secondary | ICD-10-CM | POA: Diagnosis present

## 2019-03-16 ENCOUNTER — Telehealth: Payer: Self-pay | Admitting: Internal Medicine

## 2019-03-16 NOTE — Telephone Encounter (Signed)
Reviewed the esophagogram/ultrasound results-left a message for the patient to call us back.

## 2019-03-21 ENCOUNTER — Other Ambulatory Visit: Payer: Self-pay

## 2019-03-21 ENCOUNTER — Telehealth: Payer: Self-pay | Admitting: *Deleted

## 2019-03-21 ENCOUNTER — Encounter: Payer: Self-pay | Admitting: *Deleted

## 2019-03-21 NOTE — Telephone Encounter (Signed)
O spoke with patient and she states she will be at appointment tomorrow

## 2019-03-21 NOTE — Telephone Encounter (Signed)
Pt has appt to see me tomorrow. Please tell daughtre/pt to keep appt with me. THx

## 2019-03-21 NOTE — Telephone Encounter (Signed)
Unable to reach daughter, she has no voice mail set up to leave a message

## 2019-03-21 NOTE — Telephone Encounter (Signed)
Daughter Varney Biles called reporting that patient has lost 20 pounds and is not eating and wants to know what can be done for patient to gain her weight back. Please advise

## 2019-03-22 ENCOUNTER — Inpatient Hospital Stay (HOSPITAL_BASED_OUTPATIENT_CLINIC_OR_DEPARTMENT_OTHER): Payer: Medicare Other | Admitting: Internal Medicine

## 2019-03-22 ENCOUNTER — Ambulatory Visit
Admission: RE | Admit: 2019-03-22 | Discharge: 2019-03-22 | Disposition: A | Discharge status: Home / Self Care | Payer: Medicare Other | Source: Ambulatory Visit | Attending: Internal Medicine | Admitting: Internal Medicine

## 2019-03-22 ENCOUNTER — Other Ambulatory Visit: Payer: Self-pay | Admitting: *Deleted

## 2019-03-22 ENCOUNTER — Telehealth: Payer: Self-pay | Admitting: *Deleted

## 2019-03-22 ENCOUNTER — Encounter: Payer: Self-pay | Admitting: Internal Medicine

## 2019-03-22 ENCOUNTER — Inpatient Hospital Stay: Payer: Medicare Other

## 2019-03-22 ENCOUNTER — Other Ambulatory Visit: Payer: Self-pay

## 2019-03-22 ENCOUNTER — Telehealth: Payer: Self-pay | Admitting: Internal Medicine

## 2019-03-22 ENCOUNTER — Other Ambulatory Visit: Payer: Self-pay | Admitting: Nurse Practitioner

## 2019-03-22 VITALS — BP 121/80 | HR 88 | Temp 97.3°F | Resp 16 | Wt 129.0 lb

## 2019-03-22 DIAGNOSIS — R1013 Epigastric pain: Secondary | ICD-10-CM

## 2019-03-22 DIAGNOSIS — R1310 Dysphagia, unspecified: Secondary | ICD-10-CM

## 2019-03-22 DIAGNOSIS — C9002 Multiple myeloma in relapse: Secondary | ICD-10-CM

## 2019-03-22 LAB — COMPREHENSIVE METABOLIC PANEL
ALT: 63 U/L — ABNORMAL HIGH (ref 0–44)
AST: 54 U/L — ABNORMAL HIGH (ref 15–41)
Albumin: 3.1 g/dL — ABNORMAL LOW (ref 3.5–5.0)
Alkaline Phosphatase: 310 U/L — ABNORMAL HIGH (ref 38–126)
Anion gap: 7 (ref 5–15)
BUN: 16 mg/dL (ref 8–23)
CO2: 24 mmol/L (ref 22–32)
Calcium: 9.3 mg/dL (ref 8.9–10.3)
Chloride: 110 mmol/L (ref 98–111)
Creatinine, Ser: 0.46 mg/dL (ref 0.44–1.00)
GFR calc Af Amer: >60 mL/min (ref >60)
GFR calc non Af Amer: >60 mL/min (ref >60)
Glucose, Bld: 111 mg/dL — ABNORMAL HIGH (ref 70–99)
Potassium: 3.5 mmol/L (ref 3.5–5.1)
Sodium: 141 mmol/L (ref 135–145)
Total Bilirubin: 0.5 mg/dL (ref 0.3–1.2)
Total Protein: 6.6 g/dL (ref 6.5–8.1)

## 2019-03-22 LAB — CBC WITH DIFFERENTIAL/PLATELET
Abs Immature Granulocytes: 0 10*3/uL (ref 0.00–0.07)
Basophils Absolute: 0.1 10*3/uL (ref 0.0–0.1)
Basophils Relative: 1 %
Eosinophils Absolute: 0.7 10*3/uL — ABNORMAL HIGH (ref 0.0–0.5)
Eosinophils Relative: 10 %
HCT: 30.6 % — ABNORMAL LOW (ref 36.0–46.0)
Hemoglobin: 9.6 g/dL — ABNORMAL LOW (ref 12.0–15.0)
Lymphocytes Relative: 60 %
Lymphs Abs: 4.1 10*3/uL — ABNORMAL HIGH (ref 0.7–4.0)
MCH: 27.5 pg (ref 26.0–34.0)
MCHC: 31.4 g/dL (ref 30.0–36.0)
MCV: 87.7 fL (ref 80.0–100.0)
Monocytes Absolute: 0.3 10*3/uL (ref 0.1–1.0)
Monocytes Relative: 4 %
Neutro Abs: 1.7 10*3/uL (ref 1.7–7.7)
Neutrophils Relative %: 25 %
Platelets: 259 10*3/uL (ref 150–400)
RBC: 3.49 MIL/uL — ABNORMAL LOW (ref 3.87–5.11)
RDW: 13.7 % (ref 11.5–15.5)
Smear Review: ADEQUATE
WBC Morphology: ABNORMAL
WBC: 6.9 10*3/uL (ref 4.0–10.5)
nRBC: 0 % (ref 0.0–0.2)

## 2019-03-22 MED ORDER — IOHEXOL 300 MG/ML  SOLN
75.0000 mL | Freq: Once | INTRAMUSCULAR | Status: AC | PRN
  Administered 2019-03-22: 75 mL via INTRAVENOUS

## 2019-03-22 NOTE — Telephone Encounter (Signed)
-----   Message from [name], MD sent at 03/22/2019  4:02 PM EDT -----   ----- Message ----- From: Rushie Chestnut, [place] Sent: 03/22/2019   3:52 PM EDT To: [name], MD

## 2019-03-22 NOTE — Telephone Encounter (Signed)
Per Dr. Jacinto Reap "Please inform pt to stop using asprin- starting today. I will instruct her when to start the asprin after the procedure."

## 2019-03-22 NOTE — Telephone Encounter (Signed)
Spoke with patient. Explained to patient that GI will contact her with an apt with Dr. Vicente Males. She gave verbal understanding. Unable to reach daughter as voice mail box is full.

## 2019-03-22 NOTE — Assessment & Plan Note (Signed)
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  July 2020 myeloma markers-absent myeloma protein/normal kappa lambda light chain ratio.  Stable.   # Continue to Hold Revlimid maintenance-because of feeling poorly/see discussion below  # Dysphagia/weight loss- unclear etiology.  Could not tolerate barium esophagogram.  Recommend GI evaluation asap; for possible EGD.  CT scan neck ASAP.  # Elevated LFTs-alk phos with a slightly worsening-ultrasound abdomen no acute process; hepatitis panel at next visit.  # Bone lesions/pain stable last zometa on 11/27/2017.  Holding Zometa secondary to poor dentition.  # DISPOSITION: # CT neck ASAP; # referral to [facility] ASAP re: Dysphagia/weight loss # follow up in 2 weeks- Dr.B

## 2019-03-22 NOTE — Progress Notes (Signed)
Please inform pt to stop using asprin- starting today. I will instruct her when to start the asprin after the procedure. And print me the form- and we can fax the form tomorrow.  Thx

## 2019-03-22 NOTE — Telephone Encounter (Signed)
RN spoke with patient. Patient gave verbal understanding and read back the instructions to stop her over the counter asa.

## 2019-03-22 NOTE — Progress Notes (Signed)
[facility] OFFICE PROGRESS NOTE  Patient Care Team: Center, [facility] as PCP - General ([place]) [name], [name]  Cancer Staging No matching staging information was found for the patient.   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   # 9/14- velcade SQ twice weekly/Dex 40 mg/week; OCT 5th 2018-Start R [13m]VD; 3cycles of RVD- PARTAL RESPONSE  # Jan 11th 2019-Dara-Rev-Dex; April 2019- BMBx- plasma cell -by CD-138/IHC-80% [baseline Sep 2018- 85% ]; HOLD transplant [dw Dr.Gasperatto]  # April 29th 2019 2019- carfil-Cyt-Dex; AUG 6th BMBx- 6% plasma cells; VGPR  # -Autologousstem cell transplant on 06/15/18 [[facility]/ Dr.Gasperrato]  # may 1st week- Maintenance Revlimid 10 mg 3w/1w  # 12/12- RIGHT JUGULAR DVT-x 345mn xarelto; finished April 2020  # Acute renal failure [Dr.Singh; Proteinuria 1.5gm/day ]; acyclovir/Asprin -------------------------------------------------------------   DIAGNOSIS: '[ ]'  MULTIPLE MYELOMA  STAGE: III/HIGH RISK ;GOALS: CONTROL  CURRENT/MOST RECENT THERAPY-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     [place] [place]  Chemotherapy   The patient had palonosetron (ALOXI) injection 0.25 mg, 0.25 mg, Intravenous,  Once, 6 of 7 cycles Administration: 0.25 mg (11/21/2017), 0.25 mg (11/29/2017), 0.25 mg (12/05/2017), 0.25 mg (12/21/2017), 0.25 mg (12/28/2017), 0.25 mg (01/04/2018), 0.25 mg (01/18/2018), 0.25 mg (01/25/2018), 0.25 mg (02/01/2018), 0.25 mg (02/15/2018), 0.25 mg (02/22/2018), 0.25 mg (03/01/2018), 0.25 mg (03/15/2018), 0.25 mg (03/22/2018),  0.25 mg (03/30/2018), 0.25 mg (04/13/2018), 0.25 mg (04/20/2018), 0.25 mg (04/27/2018) cyclophosphamide (CYTOXAN) 500 mg in sodium chloride 0.9 % 250 mL chemo infusion, 540 mg, Intravenous,  Once, 6 of 7 cycles Administration: 500 mg (11/21/2017), 500 mg (11/29/2017), 500 mg (12/05/2017), 500 mg (12/28/2017), 500 mg (01/04/2018), 500 mg (01/18/2018), 500 mg (01/25/2018), 500 mg (02/01/2018), 500 mg (02/15/2018), 500 mg (02/22/2018), 500 mg (03/01/2018), 500 mg (03/15/2018), 500 mg (03/30/2018), 500 mg (04/13/2018), 500 mg (04/20/2018), 500 mg (04/27/2018) carfilzomib (KYPROLIS) 36 mg in dextrose 5 % 50 mL chemo infusion, 20 mg/m2 = 36 mg, Intravenous, Once, 6 of 7 cycles Administration: 36 mg (11/21/2017), 36 mg (11/22/2017), 60 mg (11/29/2017), 60 mg (11/30/2017), 60 mg (12/05/2017), 60 mg (12/06/2017), 60 mg (12/21/2017), 60 mg (12/22/2017), 60 mg (12/28/2017), 60 mg (12/29/2017), 60 mg (01/04/2018), 60 mg (01/05/2018), 60 mg (01/18/2018), 60 mg (01/19/2018), 60 mg (01/25/2018), 60 mg (01/27/2018), 60 mg (02/01/2018), 60 mg (02/02/2018), 60 mg (02/15/2018), 60 mg (02/16/2018), 60 mg (02/22/2018), 60 mg (03/01/2018), 60 mg (03/02/2018), 60 mg (03/15/2018), 60 mg (03/16/2018), 60 mg (03/23/2018), 60 mg (03/30/2018), 60 mg (03/31/2018), 60 mg (04/13/2018), 60 mg (04/14/2018), 60 mg (04/20/2018), 60 mg (04/21/2018), 60 mg (04/27/2018)  for chemotherapy treatment.        INTERVAL HISTORY:  Brandi MALANGA89.o.  female pleasant patient above history of relapsed multiple myeloma-currently on maintenance Revlimid; Revlimid is on hold for the last 2 weeks-because patient feeling poorly.  Patient continues to complain of difficulty swallowing especially to solids; able to keep down liquids.  She is regurgitating solids.  Denies any swelling in the neck.  Poor appetite.  Lost about 10 pounds in the last 2 weeks.  Complains of shortness of breath on exertion.  Chronic mild cough.  Not any worse.  No fevers or chills.  No diarrhea.  Review  of Systems  Constitutional:  Positive for malaise/fatigue and weight loss. Negative for chills, diaphoresis and fever.  HENT: Negative for nosebleeds and sore throat.   Eyes: Negative for double vision.  Respiratory: Negative for hemoptysis, sputum production and shortness of breath.   Cardiovascular: Negative for chest pain, palpitations and orthopnea.  Gastrointestinal: Positive for nausea and vomiting. Negative for abdominal pain, blood in stool, constipation, heartburn and melena.       Difficulty swallowing.  Genitourinary: Negative for dysuria, frequency and urgency.  Skin: Negative.  Negative for itching and rash.  Neurological: Negative for dizziness, tingling, focal weakness, weakness and headaches.  Endo/Heme/Allergies: Does not bruise/bleed easily.  Psychiatric/Behavioral: Negative for depression. The patient is not nervous/anxious and does not have insomnia.     PAST MEDICAL HISTORY :  Past Medical History:  Diagnosis Date  . Hypertension   . Multiple myeloma ([place])     PAST SURGICAL HISTORY :   Past Surgical History:  Procedure Laterality Date  . IR FLUORO GUIDE PORT INSERTION RIGHT  12/16/2017    FAMILY HISTORY :   Family History  Problem Relation Age of Onset  . Pneumonia Mother   . Seizures Father     SOCIAL HISTORY:   Social History   Tobacco Use  . Smoking status: Current Every Day Smoker    Packs/day: 0.50    Types: Cigarettes  . Smokeless tobacco: Never Used  Substance Use Topics  . Alcohol use: No  . Drug use: No    ALLERGIES:  has No Known Allergies.  MEDICATIONS:  Current Outpatient Medications  Medication Sig Dispense Refill  . acyclovir (ZOVIRAX) 400 MG tablet TAKE 1 TABLET BY MOUTH ONCE DAILY TO  PREVENT  SHINGLES 30 tablet 0  . albuterol (PROVENTIL HFA;VENTOLIN HFA) 108 (90 Base) MCG/ACT inhaler Inhale 2 puffs into the lungs every 6 (six) hours as needed for wheezing or shortness of breath. 1 Inhaler 2  . amLODipine (NORVASC) 10 MG tablet Take 1 tablet (10 mg total)  by mouth daily. 30 tablet 4  . aspirin EC 325 MG tablet Take 81 mg by mouth daily.     . ergocalciferol (VITAMIN D2) 1.25 MG (50000 UT) capsule Take 1 capsule (50,000 Units total) by mouth once a week. 12 capsule 2  . feeding supplement, ENSURE ENLIVE, (ENSURE ENLIVE) LIQD Take 237 mLs by mouth 2 (two) times daily between meals. 60 Bottle 0  . lenalidomide (REVLIMID) 10 MG capsule Take 1 capsule (10 mg total) by mouth daily. Take for 21 days then hold for 7 days, Repeat every 28 days. 21 capsule 0  . ondansetron (ZOFRAN) 8 MG tablet One pill every 8 hours as needed for nausea/vomitting. 40 tablet 1  . pantoprazole (PROTONIX) 40 MG tablet Take 1 tablet (40 mg total) by mouth daily. 90 tablet 3  . polyethylene glycol (MIRALAX / GLYCOLAX) packet Take 17 g by mouth daily as needed for mild constipation. 14 each 0  . prochlorperazine (COMPAZINE) 10 MG tablet Take 1 tablet (10 mg total) by mouth every 6 (six) hours as needed for nausea or vomiting. 30 tablet 3  . traMADol (ULTRAM) 50 MG tablet Take 1 tablet (50 mg total) by mouth every 8 (eight) hours as needed. 60 tablet 0  . potassium chloride SA (K-DUR) 20 MEQ tablet Take 1 tablet (20 mEq total) by mouth once for 1 dose. 30 tablet 6   No current facility-administered medications for this visit.    Facility-Administered Medications Ordered in Other Visits  Medication Dose Route Frequency Provider Last Rate Last Dose  . sodium chloride flush (NS) 0.9 % injection 10 mL  10 mL Intracatheter PRN [name], MD   10 mL at 02/23/18 1400    PHYSICAL EXAMINATION: ECOG PERFORMANCE STATUS: 1 - Symptomatic but completely ambulatory  BP 121/80 (BP Location: Left Arm, Patient Position: Sitting, Cuff Size: Normal)   Pulse 88   Temp (!) 97.3 F (36.3 C) (Tympanic)   Resp 16   Wt 129 lb (58.5 kg)   BMI 24.37 kg/m   Filed Weights   03/22/19 1006  Weight: 129 lb (58.5 kg)    Physical Exam  Constitutional: She is oriented to person, place,  and time and well-developed, well-nourished, and in no distress.  Alone. She is walking herself.  HENT:  Head: Normocephalic and atraumatic.  Poor dentition.  Eyes: Pupils are equal, round, and reactive to light.  Neck: Normal range of motion. Neck supple.  Cardiovascular: Normal rate and regular rhythm.  Pulmonary/Chest: Effort normal and breath sounds normal. No respiratory distress. She has no wheezes.  Abdominal: Soft. Bowel sounds are normal. She exhibits no distension and no mass. There is no abdominal tenderness. There is no rebound and no guarding.  Musculoskeletal: Normal range of motion.        General: No tenderness.  Neurological: She is alert and oriented to person, place, and time.  Skin: Skin is warm.  Psychiatric: Affect normal.   LABORATORY DATA:  I have reviewed the data as listed    Component Value Date/Time   NA 141 03/22/2019 0941   K 3.5 03/22/2019 0941   CL 110 03/22/2019 0941   CO2 24 03/22/2019 0941   GLUCOSE 111 (H) 03/22/2019 0941   BUN 16 03/22/2019 0941   CREATININE 0.46 03/22/2019 0941   CALCIUM 9.3 03/22/2019 0941   PROT 6.6 03/22/2019 0941   ALBUMIN 3.1 (L) 03/22/2019 0941   AST 54 (H) 03/22/2019 0941   ALT 63 (H) 03/22/2019 0941   ALKPHOS 310 (H) 03/22/2019 0941   BILITOT 0.5 03/22/2019 0941   GFRNONAA >60 03/22/2019 0941   GFRAA >60 03/22/2019 0941    No results found for: SPEP, UPEP  Lab Results  Component Value Date   WBC 6.9 03/22/2019   NEUTROABS 1.7 03/22/2019   HGB 9.6 (L) 03/22/2019   HCT 30.6 (L) 03/22/2019   MCV 87.7 03/22/2019   PLT 259 03/22/2019      Chemistry      Component Value Date/Time   NA 141 03/22/2019 0941   K 3.5 03/22/2019 0941   CL 110 03/22/2019 0941   CO2 24 03/22/2019 0941   BUN 16 03/22/2019 0941   CREATININE 0.46 03/22/2019 0941      Component Value Date/Time   CALCIUM 9.3 03/22/2019 0941   ALKPHOS 310 (H) 03/22/2019 0941   AST 54 (H) 03/22/2019 0941   ALT 63 (H) 03/22/2019 0941   BILITOT  0.5 03/22/2019 0941       RADIOGRAPHIC STUDIES: I have personally reviewed the radiological images as listed and agreed with the findings in the report. No results found.   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  July 2020 myeloma markers-absent myeloma protein/normal kappa lambda light chain ratio.  Stable.   # Continue to Hold Revlimid maintenance-because of feeling poorly/see discussion below  # Dysphagia/weight loss- unclear etiology.  Could not tolerate  barium esophagogram.  Recommend GI evaluation asap; for possible EGD.  CT scan neck ASAP.  # Elevated LFTs-alk phos with a slightly worsening-ultrasound abdomen no acute process; hepatitis panel at next visit.  # Bone lesions/pain stable last zometa on 11/27/2017.  Holding Zometa secondary to poor dentition.  # DISPOSITION: # CT neck ASAP; # referral to [facility] ASAP re: Dysphagia/weight loss # follow up in 2 weeks- Dr.B  Orders Placed This Encounter  Procedures  . CT SOFT TISSUE NECK W CONTRAST    Standing Status:   Future    Standing Expiration Date:   06/21/2020    Order Specific Question:   ** REASON FOR EXAM (FREE TEXT)    Answer:   dyspagia    Order Specific Question:   If indicated for the ordered procedure, I authorize the administration of contrast media per Radiology protocol    Answer:   Yes    Order Specific Question:   Preferred imaging location?    Answer:   [facility]    Order Specific Question:   Radiology Contrast Protocol - do NOT remove file path    Answer:   [email or web address]   All questions were answered. The patient knows to call the clinic with any problems, questions or concerns.      [name], MD 03/22/2019 11:18 AM

## 2019-03-22 NOTE — Telephone Encounter (Signed)
Spoke to patient that CT scan neck negative for any obvious obstruction/mass.  Recommend further work-up with EGD/discussed with Dr. Vicente Males; and kindly agrees to evaluate the patient at the earliest.  Patient informed.

## 2019-03-26 ENCOUNTER — Other Ambulatory Visit: Payer: Self-pay

## 2019-03-26 ENCOUNTER — Ambulatory Visit (INDEPENDENT_AMBULATORY_CARE_PROVIDER_SITE_OTHER): Payer: Medicare Other | Admitting: Gastroenterology

## 2019-03-26 VITALS — BP 119/73 | HR 63 | Temp 98.2°F | Ht 61.0 in | Wt 129.0 lb

## 2019-03-26 DIAGNOSIS — R945 Abnormal results of liver function studies: Secondary | ICD-10-CM

## 2019-03-26 DIAGNOSIS — R7989 Other specified abnormal findings of blood chemistry: Secondary | ICD-10-CM

## 2019-03-26 DIAGNOSIS — R1310 Dysphagia, unspecified: Secondary | ICD-10-CM

## 2019-03-26 NOTE — Progress Notes (Signed)
Brandi Bellows MD, MRCP(U.K) [address]  [place]  [place], [place] [address]  Main: [phone]  Fax: [phone]   Gastroenterology Consultation  Referring Provider:     [name] * Primary Care Physician:  Center, [facility] Primary Gastroenterologist:  Dr. Jonathon [name]  Reason for Consultation:     Dysphagia        HPI:   Brandi [name] is a 67 y.o. y/o female referred for dysphagia.  She carries a diagnosis of multiple myeloma.  Prior stem cell transplant in 06/14/2018.  She complained of difficulty swallowing when she was seen by Dr. Rogue Bussing on 03/22/2019.  And has lost 10 pounds in over 2 weeks.  She did CT scan of the neck with contrast on 03/22/2019 which was normal.  She underwent a barium swallow with barium tablet on 03/14/2019.  Examination was limited by patient's nausea and inability to tolerate barium contrast.  Spontaneous moderate volume reflux to the upper esophagus was noted probably related to the vomiting process.   She has had difficulties in swallowing for solids and liquids for years.  She states that the issue has not got worse over the years.  Occurs very often and food get stuck in the middle of her throat.  She has actually had to cough it up a few times.  She had been on Zantac in the past but has been changed to Pepcid.  She has had some weight loss.  Denies any prior EGD.  At this time not taking any PPI or H2 receptor blocker.  She is a current smoker.   Past Medical History:  Diagnosis Date   Hypertension    Multiple myeloma [facility])     Past Surgical History:  Procedure Laterality Date   IR FLUORO GUIDE PORT INSERTION RIGHT  12/16/2017    Prior to Admission medications   Medication Sig Start Date End Date Taking? Authorizing Provider  acyclovir (ZOVIRAX) 400 MG tablet TAKE 1 TABLET BY MOUTH ONCE DAILY TO  PREVENT  SHINGLES 12/26/18   [name], MD  albuterol (PROVENTIL HFA;VENTOLIN HFA) 108 (90 Base)  MCG/ACT inhaler Inhale 2 puffs into the lungs every 6 (six) hours as needed for wheezing or shortness of breath. 12/28/17   [name], MD  amLODipine (NORVASC) 10 MG tablet Take 1 tablet (10 mg total) by mouth daily. 03/30/18   [name], MD  aspirin EC 325 MG tablet Take 81 mg by mouth daily.     [provider]  ergocalciferol (VITAMIN D2) 1.25 MG (50000 UT) capsule Take 1 capsule (50,000 Units total) by mouth once a week. 12/26/18   [name], MD  feeding supplement, ENSURE ENLIVE, (ENSURE ENLIVE) LIQD Take 237 mLs by mouth 2 (two) times daily between meals. 04/12/17   Gouru, Illene Silver, MD  lenalidomide (REVLIMID) 10 MG capsule Take 1 capsule (10 mg total) by mouth daily. Take for 21 days then hold for 7 days, Repeat every 28 days. 02/05/19   [name], MD  ondansetron (ZOFRAN) 8 MG tablet One pill every 8 hours as needed for nausea/vomitting. 07/06/18   [name], MD  pantoprazole (PROTONIX) 40 MG tablet Take 1 tablet (40 mg total) by mouth daily. 01/23/19   [name], MD  polyethylene glycol (MIRALAX / GLYCOLAX) packet Take 17 g by mouth daily as needed for mild constipation. 04/11/17   Gouru, Illene Silver, MD  potassium chloride SA (K-DUR) 20 MEQ tablet Take 1  tablet (20 mEq total) by mouth once for 1 dose. 02/05/19 02/05/19  [name], MD  prochlorperazine (COMPAZINE) 10 MG tablet Take 1 tablet (10 mg total) by mouth every 6 (six) hours as needed for nausea or vomiting. 12/26/18   [name], MD  traMADol (ULTRAM) 50 MG tablet Take 1 tablet (50 mg total) by mouth every 8 (eight) hours as needed. 12/26/18   [name], MD    Family History  Problem Relation Age of Onset   Pneumonia Mother    Seizures Father      Social History   Tobacco Use   Smoking status: Current Every Day Smoker    Packs/day: 0.50    Types: Cigarettes   Smokeless tobacco: Never Used  Substance Use Topics   Alcohol use:  No   Drug use: No    Allergies as of 03/26/2019   (No Known Allergies)    Review of Systems:    All systems reviewed and negative except where noted in HPI.   Physical Exam:  There were no vitals taken for this visit. No LMP recorded. Patient is postmenopausal. Psych:  Alert and cooperative. Normal mood and affect. General:   Alert,  Well-developed, well-nourished, pleasant and cooperative in NAD Head:  Normocephalic and atraumatic. Eyes:  Sclera clear, no icterus.   Conjunctiva pink. Ears:  Normal auditory acuity. Nose:  No deformity, discharge, or lesions. Mouth:  No deformity or lesions,oropharynx pink & moist. Neck:  Supple; no masses or thyromegaly. Lungs:  Respirations even and unlabored.  Clear throughout to auscultation.   No wheezes, crackles, or rhonchi. No acute distress. Heart:  Regular rate and rhythm; no murmurs, clicks, rubs, or gallops. Abdomen:  Normal bowel sounds.  No bruits.  Soft, non-tender and non-distended without masses, hepatosplenomegaly or hernias noted.  No guarding or rebound tenderness.    Neurologic:  Alert and oriented x3;  grossly normal neurologically. Skin:  Intact without significant lesions or rashes. No jaundice. Lymph Nodes:  No significant cervical adenopathy. Psych:  Alert and cooperative. Normal mood and affect.  Imaging Studies: Ct Soft Tissue Neck W Contrast  Result Date: 03/22/2019 CLINICAL DATA:  Dysphagia, weight loss EXAM: CT NECK WITH CONTRAST TECHNIQUE: Multidetector CT imaging of the neck was performed using the standard protocol following the bolus administration of intravenous contrast. CONTRAST:  68m OMNIPAQUE IOHEXOL 300 MG/ML  SOLN COMPARISON:  Barium swallow 03/14/2019 FINDINGS: Pharynx and larynx: Normal. No mass or swelling. Proximal esophagus normal. Salivary glands: No inflammation, mass, or stone. Thyroid: Mild thyroid enlargement. Benign-appearing calcification 5 mm right thyroid. 5 mm left thyroid nodule. Lymph  nodes: No enlarged or pathologic lymph nodes. Vascular: Atherosclerotic calcification aortic arch and carotid bifurcation bilaterally. Left internal jugular widely patent. Right internal jugular small in caliber and may be occluded. Limited intracranial: Negative Visualized orbits: Not imaged Mastoids and visualized paranasal sinuses: Negative Skeleton: Moderate disc and facet degeneration multiple levels. No acute skeletal abnormality. Upper chest: Lung apices clear bilaterally. Other: None IMPRESSION: Negative for mass in the pharynx or proximal esophagus. Negative for mass or adenopathy in the neck Atherosclerotic disease Electronically Signed   By: CFranchot GalloM.D.   On: 03/22/2019 12:47   UKoreaAbdomen Complete  Result Date: 03/14/2019 CLINICAL DATA:  Abdominal pain, epigastric pain EXAM: ABDOMEN ULTRASOUND COMPLETE COMPARISON:  04/05/2017 renal ultrasound.  CT 04/05/2017. FINDINGS: Gallbladder: No gallstones or wall thickening visualized. No sonographic Murphy sign noted by sonographer. Common bile duct: Diameter: Normal caliber, 2 mm Liver:  No focal lesion identified. Within normal limits in parenchymal echogenicity. Portal vein is patent on color Doppler imaging with normal direction of blood flow towards the liver. IVC: No abnormality visualized. Pancreas: Visualized portion unremarkable. Spleen: Size and appearance within normal limits. Right Kidney: Length: 12.1 cm. No hydronephrosis or mass. Increased echotexture. Left Kidney: Length: 12.1 cm. No hydronephrosis or mass. Increased echotexture. Abdominal aorta: Atherosclerotic irregularity.  No aneurysm. Other findings: None. IMPRESSION: Mildly increased echotexture throughout the kidneys bilaterally compatible with chronic medical renal disease. Aortic atherosclerosis. No acute findings. Electronically Signed   By: Rolm Baptise M.D.   On: 03/14/2019 13:14   Dg Esophagus W Double Cm (hd)  Result Date: 03/14/2019 CLINICAL DATA:  Dysphagia,  epigastric pain, history of reflux EXAM: ESOPHOGRAM / BARIUM SWALLOW / BARIUM TABLET STUDY TECHNIQUE: Combined double contrast and single contrast examination performed using effervescent crystals, thick barium liquid, and thin barium liquid. The patient was observed with fluoroscopy swallowing a 13 mm barium sulphate tablet. FLUOROSCOPY TIME:  Fluoroscopy Time:  54 seconds Number of Acquired Spot Images: 27 COMPARISON:  None. FINDINGS: Examination is limited by patient nausea and inability to tolerate barium contrast. In particular, column views of the esophagus were not performed. Within this limitation, normal oropharyngeal phase of swallow. No evidence of penetration or aspiration. Normal partial double contrast examination of the esophagus. The gastroesophageal junction is patent. A swallowed 13 mm barium tablet passes readily through the gastroesophageal junction. There is spontaneous, moderate volume reflux to the upper esophagus. Limited, partial double contrast examination of the stomach and proximal small bowel is unremarkable. IMPRESSION: 1. Examination is limited by patient nausea and inability to tolerate barium contrast. 2. Spontaneous, moderate volume reflux to the upper esophagus. No other abnormality noted. Electronically Signed   By: Eddie Candle M.D.   On: 03/14/2019 10:45    Assessment and Plan:   AVALINE STILLSON is a 68 y.o. y/o female has been referred for dysphagia and has a history of multiple myeloma. I also do note that patient has incidentally elevated alkaline phosphatase, right upper quadrant ultrasound was negative.  I would suggest that we evaluate the dysphagia with an urgent endoscopy and evaluate the elevated alkaline phosphatase by checking fractionated levels, GGT, autoimmune liver work-up and acute viral hepatitis panel.  I also asked her to commence on Pepcid 40 mg once a day  I have discussed alternative options, risks & benefits,  which include, but are not limited  to, bleeding, infection, perforation,respiratory complication & drug reaction.  The patient agrees with this plan & written consent will be obtained.     follow up in 3 weeks  Dr Brandi Bellows MD,MRCP(U.K)

## 2019-03-27 ENCOUNTER — Other Ambulatory Visit
Admission: RE | Admit: 2019-03-27 | Discharge: 2019-03-27 | Disposition: A | Discharge status: Home / Self Care | Payer: Medicare Other | Source: Ambulatory Visit | Attending: Gastroenterology | Admitting: Gastroenterology

## 2019-03-27 DIAGNOSIS — Z01812 Encounter for preprocedural laboratory examination: Secondary | ICD-10-CM | POA: Insufficient documentation

## 2019-03-27 DIAGNOSIS — R1310 Dysphagia, unspecified: Secondary | ICD-10-CM | POA: Diagnosis not present

## 2019-03-27 DIAGNOSIS — Z20828 Contact with and (suspected) exposure to other viral communicable diseases: Secondary | ICD-10-CM | POA: Diagnosis not present

## 2019-03-27 LAB — SARS CORONAVIRUS 2 (TAT 6-24 HRS): SARS Coronavirus 2: NEGATIVE

## 2019-03-28 ENCOUNTER — Ambulatory Visit: Payer: Medicare Other

## 2019-03-28 LAB — ALPHA-1-ANTITRYPSIN: A-1 Antitrypsin: 112 mg/dL (ref 101–187)

## 2019-03-28 LAB — CELIAC DISEASE AB SCREEN W/RFX
Antigliadin Abs, IgA: 2 units (ref 0–19)
Transglutaminase IgA: <2 U/mL (ref 0–3)

## 2019-03-28 LAB — IRON,TIBC AND FERRITIN PANEL
Ferritin: 825 ng/mL — ABNORMAL HIGH (ref 15–150)
Iron Saturation: 26 % (ref 15–55)
Iron: 56 ug/dL (ref 27–139)
Total Iron Binding Capacity: 217 ug/dL — ABNORMAL LOW (ref 250–450)
UIBC: 161 ug/dL (ref 118–369)

## 2019-03-28 LAB — MITOCHONDRIAL/SMOOTH MUSCLE AB PNL
Mitochondrial Ab: <20 Units (ref 0.0–20.0)
Smooth Muscle Ab: 7 Units (ref 0–19)

## 2019-03-28 LAB — HEPATITIS B E ANTIGEN: Hep B E Ag: NEGATIVE

## 2019-03-28 LAB — GAMMA GT: GGT: 143 IU/L — ABNORMAL HIGH (ref 0–60)

## 2019-03-28 LAB — IMMUNOGLOBULINS A/E/G/M, SERUM
IgA/Immunoglobulin A, Serum: 145 mg/dL (ref 87–352)
IgE (Immunoglobulin E), Serum: 1488 IU/mL — ABNORMAL HIGH (ref 6–495)
IgG (Immunoglobin G), Serum: 1194 mg/dL (ref 586–1602)
IgM (Immunoglobulin M), Srm: 25 mg/dL — ABNORMAL LOW (ref 26–217)

## 2019-03-28 LAB — ALKALINE PHOSPHATASE, ISOENZYMES
Alkaline Phosphatase: 266 IU/L — ABNORMAL HIGH (ref 39–117)
BONE FRACTION: 14 % (ref 14–68)
INTESTINAL FRAC.: 0 % (ref 0–18)
LIVER FRACTION: 86 % — ABNORMAL HIGH (ref 18–85)

## 2019-03-28 LAB — PTH, INTACT AND CALCIUM
Calcium: 9.5 mg/dL (ref 8.7–10.3)
PTH: 12 pg/mL — ABNORMAL LOW (ref 15–65)

## 2019-03-28 LAB — CERULOPLASMIN: Ceruloplasmin: 37.1 mg/dL (ref 19.0–39.0)

## 2019-03-28 LAB — ANTI-MICROSOMAL ANTIBODY LIVER / KIDNEY: LKM1 Ab: 0.8 Units (ref 0.0–20.0)

## 2019-03-28 LAB — HEPATITIS B CORE ANTIBODY, TOTAL: Hep B Core Total Ab: NEGATIVE

## 2019-03-28 LAB — HEPATITIS A ANTIBODY, TOTAL: hep A Total Ab: POSITIVE — AB

## 2019-03-28 LAB — HEPATITIS B E ANTIBODY: Hep B E Ab: NEGATIVE

## 2019-03-28 LAB — HEPATITIS B SURFACE ANTIBODY,QUALITATIVE: Hep B Surface Ab, Qual: NONREACTIVE

## 2019-03-28 LAB — CK: Total CK: 29 U/L — ABNORMAL LOW (ref 32–182)

## 2019-03-28 LAB — ANA: Anti Nuclear Antibody (ANA): NEGATIVE

## 2019-03-28 LAB — HIV ANTIBODY (ROUTINE TESTING W REFLEX): HIV Screen 4th Generation wRfx: NONREACTIVE

## 2019-03-28 LAB — HEPATITIS C ANTIBODY: Hep C Virus Ab: 0.1 s/co ratio (ref 0.0–0.9)

## 2019-03-28 LAB — HEPATITIS B SURFACE ANTIGEN: Hepatitis B Surface Ag: NEGATIVE

## 2019-03-30 ENCOUNTER — Ambulatory Visit: Payer: Medicare Other | Admitting: Anesthesiology

## 2019-03-30 ENCOUNTER — Encounter
Admission: RE | Disposition: A | Discharge status: Home / Self Care | Payer: Self-pay | Source: Home / Self Care | Attending: Gastroenterology

## 2019-03-30 ENCOUNTER — Ambulatory Visit
Admission: RE | Admit: 2019-03-30 | Discharge: 2019-03-30 | Disposition: A | Discharge status: Home / Self Care | Payer: Medicare Other | Attending: Gastroenterology | Admitting: Gastroenterology

## 2019-03-30 ENCOUNTER — Encounter: Payer: Self-pay | Admitting: *Deleted

## 2019-03-30 DIAGNOSIS — Z7982 Long term (current) use of aspirin: Secondary | ICD-10-CM | POA: Insufficient documentation

## 2019-03-30 DIAGNOSIS — Z79899 Other long term (current) drug therapy: Secondary | ICD-10-CM | POA: Diagnosis not present

## 2019-03-30 DIAGNOSIS — Z8579 Personal history of other malignant neoplasms of lymphoid, hematopoietic and related tissues: Secondary | ICD-10-CM | POA: Diagnosis not present

## 2019-03-30 DIAGNOSIS — F17210 Nicotine dependence, cigarettes, uncomplicated: Secondary | ICD-10-CM | POA: Insufficient documentation

## 2019-03-30 DIAGNOSIS — I10 Essential (primary) hypertension: Secondary | ICD-10-CM | POA: Diagnosis not present

## 2019-03-30 DIAGNOSIS — R1310 Dysphagia, unspecified: Secondary | ICD-10-CM | POA: Insufficient documentation

## 2019-03-30 HISTORY — PX: ESOPHAGOGASTRODUODENOSCOPY (EGD) WITH PROPOFOL: SHX5813

## 2019-03-30 SURGERY — ESOPHAGOGASTRODUODENOSCOPY (EGD) WITH PROPOFOL
Anesthesia: General

## 2019-03-30 MED ORDER — LIDOCAINE HCL (CARDIAC) PF 100 MG/5ML IV SOSY
PREFILLED_SYRINGE | INTRAVENOUS | Status: DC | PRN
  Administered 2019-03-30: 30 mg via INTRAVENOUS

## 2019-03-30 MED ORDER — LIDOCAINE HCL (PF) 1 % IJ SOLN
INTRAMUSCULAR | Status: AC
  Administered 2019-03-30: 2 mL
  Filled 2019-03-30: qty 2

## 2019-03-30 MED ORDER — ESMOLOL HCL 100 MG/10ML IV SOLN
INTRAVENOUS | Status: DC | PRN
  Administered 2019-03-30: 10 mg via INTRAVENOUS

## 2019-03-30 MED ORDER — PROPOFOL 500 MG/50ML IV EMUL
INTRAVENOUS | Status: DC | PRN
  Administered 2019-03-30: 150 ug/kg/min via INTRAVENOUS

## 2019-03-30 MED ORDER — PROPOFOL 500 MG/50ML IV EMUL
INTRAVENOUS | Status: DC | PRN
  Administered 2019-03-30 (×2): 20 mg via INTRAVENOUS
  Administered 2019-03-30 (×2): 30 mg via INTRAVENOUS

## 2019-03-30 MED ORDER — SODIUM CHLORIDE 0.9 % IV SOLN
INTRAVENOUS | Status: DC
  Administered 2019-03-30: 11:00:00 via INTRAVENOUS

## 2019-03-30 MED ORDER — PROPOFOL 500 MG/50ML IV EMUL
INTRAVENOUS | Status: AC
  Filled 2019-03-30: qty 50

## 2019-03-30 NOTE — Anesthesia Preprocedure Evaluation (Addendum)
Anesthesia Evaluation  Patient identified by MRN, date of birth, ID band Patient awake    Reviewed: Allergy & Precautions, H&P , NPO status , Patient's Chart, lab work & pertinent test results  Airway Mallampati: II  TM Distance: >3 FB     Dental   Pulmonary Current Smoker,           Cardiovascular hypertension,      Neuro/Psych negative neurological ROS  negative psych ROS   GI/Hepatic negative GI ROS, Neg liver ROS,   Endo/Other  negative endocrine ROS  Renal/GU Renal disease (h/o ARF)  negative genitourinary   Musculoskeletal   Abdominal   Peds  Hematology negative hematology ROS (+)   Anesthesia Other Findings Past Medical History: No date: Hypertension No date: Multiple myeloma (HCC)  Past Surgical History: 12/16/2017: IR FLUORO GUIDE PORT INSERTION RIGHT  BMI    Body Mass Index: 24.37 kg/m      Reproductive/Obstetrics negative OB ROS                            Anesthesia Physical Anesthesia Plan  ASA: II  Anesthesia Plan: General   Post-op Pain Management:    Induction:   PONV Risk Score and Plan: Propofol infusion and TIVA  Airway Management Planned: Natural Airway and Nasal Cannula  Additional Equipment:   Intra-op Plan:   Post-operative Plan:   Informed Consent: I have reviewed the patients History and Physical, chart, labs and discussed the procedure including the risks, benefits and alternatives for the proposed anesthesia with the patient or authorized representative who has indicated his/her understanding and acceptance.     Dental Advisory Given  Plan Discussed with: Anesthesiologist and CRNA  Anesthesia Plan Comments:         Anesthesia Quick Evaluation  

## 2019-03-30 NOTE — Anesthesia Post-op Follow-up Note (Signed)
Anesthesia QCDR form completed.        

## 2019-03-30 NOTE — Op Note (Signed)
[facility] Gastroenterology Patient Name: Brandi [name] Procedure Date: 03/30/2019 11:52 AM MRN: [id number] Account #: [id number] Date of Birth: Dec 12, 1950 Admit Type: Outpatient Age: 68 Room: [facility] ENDO ROOM 4 Gender: Female Note Status: Finalized Procedure:            Upper GI endoscopy Indications:          Dysphagia Providers:            Jonathon Bellows MD, MD Referring MD:         Kit Carson, MD (Referring MD) Medicines:            Monitored Anesthesia Care Complications:        No immediate complications. Procedure:            Pre-Anesthesia Assessment:                       - Prior to the procedure, a History and Physical was                        performed, and patient medications, allergies and                        sensitivities were reviewed. The patient's tolerance of                        previous anesthesia was reviewed.                       - The risks and benefits of the procedure and the                        sedation options and risks were discussed with the                        patient. All questions were answered and informed                        consent was obtained.                       - ASA Grade Assessment: III - A patient with severe                        systemic disease.                       After obtaining informed consent, the endoscope was                        passed under direct vision. Throughout the procedure,                        the patient's blood pressure, pulse, and oxygen                        saturations were monitored continuously. The Endoscope                        was introduced through the mouth, and advanced to the  third part of duodenum. The upper GI endoscopy was                        accomplished with ease. The patient tolerated the                        procedure well. Findings:      The examined esophagus was normal. A TTS dilator was passed  through the       scope. Dilation with a 12-13.5-15 mm balloon dilator was performed to 15       mm. The dilation site was examined and showed no change. Biopsies were       obtained from the proximal and distal esophagus with cold forceps for       histology of suspected eosinophilic esophagitis.      The stomach was normal.      The examined duodenum was normal.      The cardia and gastric fundus were normal on retroflexion. Impression:           - Normal esophagus. Dilated. Biopsied.                       - Normal stomach.                       - Normal examined duodenum. Recommendation:       - Discharge patient to home (with escort).                       - Resume previous diet.                       - Continue present medications.                       - Await pathology results.                       - Return to my office in 2 weeks. Procedure Code(s):    --- Professional ---                       [phone], Esophagogastroduodenoscopy, flexible, transoral;                        with transendoscopic balloon dilation of esophagus                        (less than 30 mm diameter)                       43239, 59, Esophagogastroduodenoscopy, flexible,                        transoral; with biopsy, single or multiple Diagnosis Code(s):    --- Professional ---                       R13.10, Dysphagia, unspecified CPT copyright 2019 American Medical Association. All rights reserved. The codes documented in this report are preliminary and upon coder review may  be revised to meet current compliance requirements. Jonathon Bellows, MD Jonathon Bellows MD, MD 03/30/2019 12:16:05 PM This report has been signed electronically. Number of Addenda: 0 Note Initiated  On: 03/30/2019 11:52 AM      [facility]

## 2019-03-30 NOTE — Transfer of Care (Signed)
Immediate Anesthesia Transfer of Care Note  Patient: Brandi [name]  Procedure(s) Performed: ESOPHAGOGASTRODUODENOSCOPY (EGD) WITH PROPOFOL (N/A )  Patient Location: PACU  Anesthesia Type:General  Level of Consciousness: awake, alert  and oriented  Airway & Oxygen Therapy: Patient Spontanous Breathing  Post-op Assessment: Report given to RN and Post -op Vital signs reviewed and stable  Post vital signs: Reviewed and stable  Last Vitals:  Vitals Value Taken Time  BP 146/78 03/30/19 1216  Temp    Pulse 102 03/30/19 1216  Resp 21 03/30/19 1216  SpO2 100 % 03/30/19 1216  Vitals shown include unvalidated device data.  Last Pain:  Vitals:   03/30/19 1043  TempSrc: Tympanic         Complications: No apparent anesthesia complications

## 2019-03-30 NOTE — Anesthesia Postprocedure Evaluation (Signed)
Anesthesia Post Note  Patient: Brandi [name]  Procedure(s) Performed: ESOPHAGOGASTRODUODENOSCOPY (EGD) WITH PROPOFOL (N/A )  Patient location during evaluation: PACU Anesthesia Type: General Level of consciousness: awake and alert Pain management: pain level controlled Vital Signs Assessment: post-procedure vital signs reviewed and stable Respiratory status: spontaneous breathing, nonlabored ventilation and respiratory function stable Cardiovascular status: blood pressure returned to baseline and stable Postop Assessment: no apparent nausea or vomiting Anesthetic complications: no     Last Vitals:  Vitals:   03/30/19 1216 03/30/19 1236  BP:  124/78  Pulse:    Resp:    Temp: (!) 35.9 C   SpO2:      Last Pain:  Vitals:   03/30/19 1258  TempSrc:   PainSc: 0-No pain                 Durenda Hurt

## 2019-03-30 NOTE — H&P (Signed)
Jonathon Bellows, MD [address] [place], [place], [place], [address] [address], [place], [place], [place], [address] Phone: [phone]  Fax: [phone]  Primary Care Physician:  Center, [place]   Pre-Procedure History & Physical: HPI:  Brandi [name] is a 68 y.o. female is here for an endoscopy    Past Medical History:  Diagnosis Date  . Hypertension   . Multiple myeloma [facility])     Past Surgical History:  Procedure Laterality Date  . IR FLUORO GUIDE PORT INSERTION RIGHT  12/16/2017    Prior to Admission medications   Medication Sig Start Date End Date Taking? Authorizing Provider  albuterol (PROVENTIL HFA;VENTOLIN HFA) 108 (90 Base) MCG/ACT inhaler Inhale 2 puffs into the lungs every 6 (six) hours as needed for wheezing or shortness of breath. 12/28/17  Yes [name], MD  amLODipine (NORVASC) 10 MG tablet Take 1 tablet by mouth once daily 03/27/19  Yes Verlon Au, NP  feeding supplement, ENSURE ENLIVE, (ENSURE ENLIVE) LIQD Take 237 mLs by mouth 2 (two) times daily between meals. 04/12/17  Yes Gouru, Illene Silver, MD  pantoprazole (PROTONIX) 40 MG tablet Take 1 tablet (40 mg total) by mouth daily. 01/23/19  Yes [name], MD  traMADol (ULTRAM) 50 MG tablet Take 1 tablet (50 mg total) by mouth every 8 (eight) hours as needed. 12/26/18  Yes [name], MD  acyclovir (ZOVIRAX) 400 MG tablet TAKE 1 TABLET BY MOUTH ONCE DAILY TO  PREVENT  SHINGLES Patient not taking: Reported on 03/26/2019 12/26/18   [name], MD  aspirin EC 325 MG tablet Take 81 mg by mouth daily.     [provider]  ergocalciferol (VITAMIN D2) 1.25 MG (50000 UT) capsule Take 1 capsule (50,000 Units total) by mouth once a week. 12/26/18   [name], MD  lenalidomide (REVLIMID) 10 MG capsule Take 1 capsule (10 mg total) by mouth daily. Take for 21 days then hold for 7 days, Repeat every 28 days. Patient not taking: Reported on 03/26/2019  02/05/19   [name], MD  ondansetron (ZOFRAN) 8 MG tablet One pill every 8 hours as needed for nausea/vomitting. 07/06/18   [name], MD  polyethylene glycol (MIRALAX / GLYCOLAX) packet Take 17 g by mouth daily as needed for mild constipation. Patient not taking: Reported on 03/26/2019 04/11/17   Nicholes Mango, MD  potassium chloride SA (K-DUR) 20 MEQ tablet Take 1 tablet (20 mEq total) by mouth once for 1 dose. 02/05/19 02/05/19  [name], MD  prochlorperazine (COMPAZINE) 10 MG tablet Take 1 tablet (10 mg total) by mouth every 6 (six) hours as needed for nausea or vomiting. 12/26/18   [name], MD    Allergies as of 03/23/2019  . (No Known Allergies)    Family History  Problem Relation Age of Onset  . Pneumonia Mother   . Seizures Father     Social History   Socioeconomic History  . Marital status: Single    Spouse name: Not on file  . Number of children: Not on file  . Years of education: Not on file  . Highest education level: Not on file  Occupational History  . Not on file  Social Needs  . Financial resource strain: Not on file  . Food insecurity    Worry: Not on file    Inability: Not on file  . Transportation needs    Medical: Not on file  Non-medical: Not on file  Tobacco Use  . Smoking status: Current Some Day Smoker    Packs/day: 0.50    Types: Cigarettes  . Smokeless tobacco: Never Used  Substance and Sexual Activity  . Alcohol use: No  . Drug use: No  . Sexual activity: Not on file  Lifestyle  . Physical activity    Days per week: Not on file    Minutes per session: Not on file  . Stress: Not on file  Relationships  . Social Herbalist on phone: Not on file    Gets together: Not on file    Attends religious service: Not on file    Active member of club or organization: Not on file    Attends meetings of clubs or organizations: Not on file    Relationship status: Not on file  . Intimate  partner violence    Fear of current or ex partner: Not on file    Emotionally abused: Not on file    Physically abused: Not on file    Forced sexual activity: Not on file  Other Topics Concern  . Not on file  Social History Narrative   Lives at home with family. Independent at baseline    Review of Systems: See HPI, otherwise negative ROS  Physical Exam: BP 131/67   Pulse 100   Temp (!) 96.9 F (36.1 C) (Tympanic)   Resp 16   Ht _0  (1.549 m)   Wt 58.5 kg   SpO2 100%   BMI 24.37 kg/m  General:   Alert,  pleasant and cooperative in NAD Head:  Normocephalic and atraumatic. Neck:  Supple; no masses or thyromegaly. Lungs:  Clear throughout to auscultation, normal respiratory effort.    Heart:  +S1, +S2, Regular rate and rhythm, No edema. Abdomen:  Soft, nontender and nondistended. Normal bowel sounds, without guarding, and without rebound.   Neurologic:  Alert and  oriented x4;  grossly normal neurologically.  Impression/Plan: Brandi [name] is here for an endoscopy  to be performed for  evaluation of dysphagia    Risks, benefits, limitations, and alternatives regarding endoscopy and dilation have been reviewed with the patient.  Questions have been answered.  All parties agreeable.   Jonathon Bellows, MD  03/30/2019, 11:48 AM

## 2019-04-04 ENCOUNTER — Encounter: Payer: Self-pay | Admitting: Gastroenterology

## 2019-04-05 NOTE — Progress Notes (Signed)
Patient is coming in for follow up, she is doing well.

## 2019-04-06 ENCOUNTER — Inpatient Hospital Stay: Payer: Medicare Other

## 2019-04-06 ENCOUNTER — Other Ambulatory Visit: Payer: Self-pay

## 2019-04-06 ENCOUNTER — Inpatient Hospital Stay: Payer: Medicare Other | Attending: Internal Medicine | Admitting: Internal Medicine

## 2019-04-06 DIAGNOSIS — I10 Essential (primary) hypertension: Secondary | ICD-10-CM | POA: Diagnosis not present

## 2019-04-06 DIAGNOSIS — Z79899 Other long term (current) drug therapy: Secondary | ICD-10-CM | POA: Diagnosis not present

## 2019-04-06 DIAGNOSIS — Z7982 Long term (current) use of aspirin: Secondary | ICD-10-CM | POA: Diagnosis not present

## 2019-04-06 DIAGNOSIS — F17210 Nicotine dependence, cigarettes, uncomplicated: Secondary | ICD-10-CM | POA: Diagnosis not present

## 2019-04-06 DIAGNOSIS — C9002 Multiple myeloma in relapse: Secondary | ICD-10-CM | POA: Diagnosis present

## 2019-04-06 DIAGNOSIS — R1310 Dysphagia, unspecified: Secondary | ICD-10-CM | POA: Diagnosis not present

## 2019-04-06 LAB — COMPREHENSIVE METABOLIC PANEL
ALT: 28 U/L (ref 0–44)
AST: 23 U/L (ref 15–41)
Albumin: 3.6 g/dL (ref 3.5–5.0)
Alkaline Phosphatase: 188 U/L — ABNORMAL HIGH (ref 38–126)
Anion gap: 9 (ref 5–15)
BUN: 13 mg/dL (ref 8–23)
CO2: 24 mmol/L (ref 22–32)
Calcium: 9.7 mg/dL (ref 8.9–10.3)
Chloride: 110 mmol/L (ref 98–111)
Creatinine, Ser: 0.31 mg/dL — ABNORMAL LOW (ref 0.44–1.00)
GFR calc Af Amer: >60 mL/min (ref >60)
GFR calc non Af Amer: >60 mL/min (ref >60)
Glucose, Bld: 105 mg/dL — ABNORMAL HIGH (ref 70–99)
Potassium: 3.6 mmol/L (ref 3.5–5.1)
Sodium: 143 mmol/L (ref 135–145)
Total Bilirubin: 0.6 mg/dL (ref 0.3–1.2)
Total Protein: 6.4 g/dL — ABNORMAL LOW (ref 6.5–8.1)

## 2019-04-06 LAB — CBC WITH DIFFERENTIAL/PLATELET
Abs Immature Granulocytes: 0.01 10*3/uL (ref 0.00–0.07)
Basophils Absolute: 0 10*3/uL (ref 0.0–0.1)
Basophils Relative: 0 %
Eosinophils Absolute: 0.3 10*3/uL (ref 0.0–0.5)
Eosinophils Relative: 4 %
HCT: 30.1 % — ABNORMAL LOW (ref 36.0–46.0)
Hemoglobin: 9.4 g/dL — ABNORMAL LOW (ref 12.0–15.0)
Immature Granulocytes: 0 %
Lymphocytes Relative: 49 %
Lymphs Abs: 3.4 10*3/uL (ref 0.7–4.0)
MCH: 27.2 pg (ref 26.0–34.0)
MCHC: 31.2 g/dL (ref 30.0–36.0)
MCV: 87 fL (ref 80.0–100.0)
Monocytes Absolute: 0.6 10*3/uL (ref 0.1–1.0)
Monocytes Relative: 8 %
Neutro Abs: 2.8 10*3/uL (ref 1.7–7.7)
Neutrophils Relative %: 39 %
Platelets: 178 10*3/uL (ref 150–400)
RBC: 3.46 MIL/uL — ABNORMAL LOW (ref 3.87–5.11)
RDW: 14.4 % (ref 11.5–15.5)
WBC: 7.1 10*3/uL (ref 4.0–10.5)
nRBC: 0 % (ref 0.0–0.2)

## 2019-04-06 NOTE — Patient Instructions (Signed)
#   Start taking revlimid 3 weeks-ON and 1 week OFF # Start back on asprin

## 2019-04-06 NOTE — Progress Notes (Signed)
[place] OFFICE PROGRESS NOTE  Patient Care Team: Center, [facility] as PCP - General ([place]) [name], [name]  Cancer Staging No matching staging information was found for the patient.   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 AnVicente Males# Acute renal failure [Dr.Singh; Proteinuria 1.5gm/day ]; acyclovir/Asprin ------------------------------------------------------------------------------------------------------------   DIAGNOSIS: '[ ]'  MULTIPLE MYELOMA  STAGE: III/HIGH RISK ;GOALS: CONTROL  CURRENT/MOST RECENT THERAPY-maintenance Revlimid    [place] [place] 11/07/2017 -  Chemotherapy   The patient had palonosetron (ALOXI) injection 0.25 mg, 0.25 mg, Intravenous,  Once, 6 of 7 cycles Administration: 0.25 mg (11/21/2017), 0.25 mg (11/29/2017), 0.25 mg (12/05/2017), 0.25 mg (12/21/2017), 0.25 mg (12/28/2017), 0.25 mg (01/04/2018), 0.25 mg (01/18/2018), 0.25 mg (01/25/2018), 0.25 mg (02/01/2018), 0.25 mg (02/15/2018), 0.25 mg  (02/22/2018), 0.25 mg (03/01/2018), 0.25 mg (03/15/2018), 0.25 mg (03/22/2018), 0.25 mg (03/30/2018), 0.25 mg (04/13/2018), 0.25 mg (04/20/2018), 0.25 mg (04/27/2018) cyclophosphamide (CYTOXAN) 500 mg in sodium chloride 0.9 % 250 mL chemo infusion, 540 mg, Intravenous,  Once, 6 of 7 cycles Administration: 500 mg (11/21/2017), 500 mg (11/29/2017), 500 mg (12/05/2017), 500 mg (12/28/2017), 500 mg (01/04/2018), 500 mg (01/18/2018), 500 mg (01/25/2018), 500 mg (02/01/2018), 500 mg (02/15/2018), 500 mg (02/22/2018), 500 mg (03/01/2018), 500 mg (03/15/2018), 500 mg (03/30/2018), 500 mg (04/13/2018), 500 mg (04/20/2018), 500 mg (04/27/2018) carfilzomib (KYPROLIS) 36 mg in dextrose 5 % 50 mL chemo infusion, 20 mg/m2 = 36 mg, Intravenous, Once, 6 of 7 cycles Administration: 36 mg (11/21/2017), 36 mg (11/22/2017), 60 mg (11/29/2017), 60 mg (11/30/2017), 60 mg (12/05/2017), 60 mg (12/06/2017), 60 mg (12/21/2017), 60 mg (12/22/2017), 60 mg (12/28/2017), 60 mg (12/29/2017), 60 mg (01/04/2018), 60 mg (01/05/2018), 60 mg (01/18/2018), 60 mg (01/19/2018), 60 mg (01/25/2018), 60 mg (01/27/2018), 60 mg (02/01/2018), 60 mg (02/02/2018), 60 mg (02/15/2018), 60 mg (02/16/2018), 60 mg (02/22/2018), 60 mg (03/01/2018), 60 mg (03/02/2018), 60 mg (03/15/2018), 60 mg (03/16/2018), 60 mg (03/23/2018), 60 mg (03/30/2018), 60 mg (03/31/2018), 60 mg (04/13/2018), 60 mg (04/14/2018), 60 mg (04/20/2018), 60 mg (04/21/2018), 60 mg (04/27/2018)  for chemotherapy treatment.       INTERVAL HISTORY:  Brandi HOCHBERG89.o.  female pleasant patient above history of relapsed multiple myeloma-currently on maintenance Revlimid; Revlimid is on hold for the last 4 weeks-as the patient has been feeling poorly.  In the interim patient was evaluated by GI-underwent EGD; had dilatation of the esophagus.  Noted to have improvement of the energy levels.  Also noted to have improvement of her swallowing.  Her appetite is improving.  No nausea no vomiting.  No fevers or chills.  Review of Systems  Constitutional:  Positive for malaise/fatigue. Negative for chills, diaphoresis and fever.  HENT: Negative for nosebleeds and sore throat.   Eyes: Negative for double vision.  Respiratory: Negative for hemoptysis, sputum production and shortness of breath.   Cardiovascular: Negative for chest pain, palpitations and orthopnea.  Gastrointestinal: Negative for abdominal pain, blood in stool, constipation, heartburn and melena.       Difficulty swallowing.  Genitourinary: Negative for dysuria, frequency and urgency.  Skin: Negative.  Negative for itching and rash.  Neurological: Negative for dizziness, tingling, focal weakness, weakness and headaches.  Endo/Heme/Allergies: Does not bruise/bleed easily.  Psychiatric/Behavioral: Negative for depression. The patient is not nervous/anxious and does not have insomnia.     PAST MEDICAL HISTORY :  Past Medical History:  Diagnosis Date  . Hypertension   . Multiple myeloma ([place])     PAST SURGICAL HISTORY :   Past Surgical History:  Procedure Laterality Date  . ESOPHAGOGASTRODUODENOSCOPY (EGD) WITH PROPOFOL N/A 03/30/2019   Procedure: ESOPHAGOGASTRODUODENOSCOPY (EGD) WITH PROPOFOL;  Surgeon: Jonathon Bellows, MD;  Location[facility] ENDOSCOPY;  Service: Gastroenterology;  Laterality: N/A;  . IR FLUORO GUIDE PORT INSERTION RIGHT  12/16/2017    FAMILY HISTORY :   Family History  Problem Relation Age of Onset  . Pneumonia Mother   . Seizures Father     SOCIAL HISTORY:   Social History   Tobacco Use  . Smoking status: Current Some Day Smoker    Packs/day: 0.50    Types: Cigarettes  . Smokeless tobacco: Never Used  Substance Use Topics  . Alcohol use: No  . Drug use: No    ALLERGIES:  has No Known Allergies.  MEDICATIONS:  Current Outpatient Medications  Medication Sig Dispense Refill  . albuterol (PROVENTIL HFA;VENTOLIN HFA) 108 (90 Base) MCG/ACT inhaler Inhale 2 puffs into the lungs every 6 (six) hours as needed for wheezing or shortness of breath. 1 Inhaler  2  . amLODipine (NORVASC) 10 MG tablet Take 1 tablet by mouth once daily 90 tablet 2  . aspirin EC 325 MG tablet Take 81 mg by mouth daily.     . ergocalciferol (VITAMIN D2) 1.25 MG (50000 UT) capsule Take 1 capsule (50,000 Units total) by mouth once a week. 12 capsule 2  . feeding supplement, ENSURE ENLIVE, (ENSURE ENLIVE) LIQD Take 237 mLs by mouth 2 (two) times daily between meals. 60 Bottle 0  . ondansetron (ZOFRAN) 8 MG tablet One pill every 8 hours as needed for nausea/vomitting. 40 tablet 1  . pantoprazole (PROTONIX) 40 MG tablet Take 1 tablet (40 mg total) by mouth daily. 90 tablet 3  . prochlorperazine (COMPAZINE) 10 MG tablet Take 1 tablet (10 mg total) by mouth every 6 (six) hours as needed for nausea or vomiting. 30 tablet 3  . traMADol (ULTRAM) 50 MG tablet Take 1 tablet (50 mg total) by mouth every 8 (eight) hours as needed. 60 tablet 0  . acyclovir (ZOVIRAX) 400 MG tablet TAKE 1 TABLET BY MOUTH ONCE DAILY TO  PREVENT  SHINGLES (Patient not taking: Reported on 03/26/2019) 30 tablet 0  . lenalidomide (REVLIMID) 10 MG capsule Take 1 capsule (10 mg total) by mouth daily. Take for 21 days then hold for 7 days, Repeat every 28 days. (Patient not taking: Reported on 03/26/2019) 21 capsule 0  . polyethylene glycol (MIRALAX / GLYCOLAX) packet Take 17 g by mouth daily as needed for mild constipation. (Patient not taking: Reported on 03/26/2019) 14 each 0  . potassium chloride SA (K-DUR) 20 MEQ tablet Take 1 tablet (20 mEq total) by mouth once for 1  dose. 30 tablet 6   No current facility-administered medications for this visit.    Facility-Administered Medications Ordered in Other Visits  Medication Dose Route Frequency Provider Last Rate Last Dose  . sodium chloride flush (NS) 0.9 % injection 10 mL  10 mL Intracatheter PRN [name], MD   10 mL at 02/23/18 1400    PHYSICAL EXAMINATION: ECOG PERFORMANCE STATUS: 1 - Symptomatic but completely ambulatory  BP 118/74 (BP Location:  Left Arm, Patient Position: Sitting, Cuff Size: Normal)   Pulse 86   Temp (!) 97.5 F (36.4 C) (Tympanic)   Resp 20   Ht '5\' 1"'  (1.549 m)   Wt 127 lb 14.4 oz (58 kg)   BMI 24.17 kg/m   Filed Weights   04/06/19 0936  Weight: 127 lb 14.4 oz (58 kg)    Physical Exam  Constitutional: She is oriented to person, place, and time and well-developed, well-nourished, and in no distress.  Alone. She is walking herself.  HENT:  Head: Normocephalic and atraumatic.  Poor dentition.  Eyes: Pupils are equal, round, and reactive to light.  Neck: Normal range of motion. Neck supple.  Cardiovascular: Normal rate and regular rhythm.  Pulmonary/Chest: Effort normal and breath sounds normal. No respiratory distress. She has no wheezes.  Abdominal: Soft. Bowel sounds are normal. She exhibits no distension and no mass. There is no abdominal tenderness. There is no rebound and no guarding.  Musculoskeletal: Normal range of motion.        General: No tenderness.  Neurological: She is alert and oriented to person, place, and time.  Skin: Skin is warm.  Psychiatric: Affect normal.   LABORATORY DATA:  I have reviewed the data as listed    Component Value Date/Time   NA 143 04/06/2019 1052   K 3.6 04/06/2019 1052   CL 110 04/06/2019 1052   CO2 24 04/06/2019 1052   GLUCOSE 105 (H) 04/06/2019 1052   BUN 13 04/06/2019 1052   CREATININE 0.31 (L) 04/06/2019 1052   CALCIUM 9.7 04/06/2019 1052   PROT 6.4 (L) 04/06/2019 1052   ALBUMIN 3.6 04/06/2019 1052   AST 23 04/06/2019 1052   ALT 28 04/06/2019 1052   ALKPHOS 188 (H) 04/06/2019 1052   BILITOT 0.6 04/06/2019 1052   GFRNONAA >60 04/06/2019 1052   GFRAA >60 04/06/2019 1052    No results found for: SPEP, UPEP  Lab Results  Component Value Date   WBC 7.1 04/06/2019   NEUTROABS 2.8 04/06/2019   HGB 9.4 (L) 04/06/2019   HCT 30.1 (L) 04/06/2019   MCV 87.0 04/06/2019   PLT 178 04/06/2019      Chemistry      Component Value Date/Time   NA 143  04/06/2019 1052   K 3.6 04/06/2019 1052   CL 110 04/06/2019 1052   CO2 24 04/06/2019 1052   BUN 13 04/06/2019 1052   CREATININE 0.31 (L) 04/06/2019 1052      Component Value Date/Time   CALCIUM 9.7 04/06/2019 1052   ALKPHOS 188 (H) 04/06/2019 1052   AST 23 04/06/2019 1052   ALT 28 04/06/2019 1052   BILITOT 0.6 04/06/2019 1052       RADIOGRAPHIC STUDIES: I have personally reviewed the radiological images as listed and agreed with the findings in the report. No results found.   ASSESSMENT & PLAN:  Multiple myeloma in relapse [facility]) #Multiple myeloma stage III high-risk cytogenetics-status post KRD-VG PR ; status post autologousstem cell transplant on 06/15/18.  July 2020 myeloma markers-absent  myeloma protein/normal kappa lambda light chain ratio.  Stable.  #Revlimid was held because of difficulty swallowing [see below]; given improvement think is reasonable to restart Revlimid maintenance.  LFTs improved labs improved.  # Dysphagia/weight loss- s/p EGD-dilatation-improved await pathology.  # Elevated LFTs-alk phos-positive for hepatitis A/as noted by GI.  Labs today are improving.  Continue follow-up with GI.  # Bone lesions/pain stable last zometa on 11/27/2017.  Holding Zometa secondary to poor dentition.STABLE.   # DISPOSITION: # labs today- cbc/cmp  # follow up in 3 weeks- MD; labs- cbc/cmpDr.B  Orders Placed This Encounter  Procedures  . CBC with Differential/Platelet    Standing Status:   Future    Number of Occurrences:   1    Standing Expiration Date:   05/10/2020  . Comprehensive metabolic panel    Standing Status:   Future    Number of Occurrences:   1    Standing Expiration Date:   05/10/2020   All questions were answered. The patient knows to call the clinic with any problems, questions or concerns.      [name], MD 04/07/2019 7:40 AM

## 2019-04-06 NOTE — Assessment & Plan Note (Addendum)
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  July 2020 myeloma markers-absent myeloma protein/normal kappa lambda light chain ratio.  Stable.  #Revlimid was held because of difficulty swallowing [see below]; given improvement think is reasonable to restart Revlimid maintenance.  LFTs improved labs improved.  # Dysphagia/weight loss- s/p EGD-dilatation-improved await pathology.  # Elevated LFTs-alk phos-positive for hepatitis A/as noted by GI.  Labs today are improving.  Continue follow-up with GI.  # Bone lesions/pain stable last zometa on 11/27/2017.  Holding Zometa secondary to poor dentition.STABLE.   # DISPOSITION: # labs today- cbc/cmp  # follow up in 3 weeks- MD; labs- cbc/cmpDr.B

## 2019-04-20 ENCOUNTER — Telehealth: Payer: Self-pay

## 2019-04-20 NOTE — Telephone Encounter (Signed)
-----   Message from Jonathon Bellows, MD sent at 04/16/2019  3:01 PM EDT ----- Brandi [name]   Please inform that her work-up for abnormal liver function tests 1. PTH is very low: I am not sure if this is secondary to hypercalcemia she has had in the past.  I will CC this note to Dr. Rogue Bussing if he thinks that the patient has had hypercalcemia from myeloma then probably the low PTH is a secondary process. 2.  Her IgE is very elevated and I will CC this message to Dr. Rogue Bussing who follows her for myeloma.  I am not sure if this is a part of that. 3.  Ferritin is elevated and it is possible that it is secondary to inflammation.  We could check for HFE gene mutation although her iron percentage saturation is normal 4.  Can you check how she is doing after her dilation  C.c DR Rogue Bussing  Dr Jonathon Bellows MD,MRCP [facility]) Gastroenterology/Hepatology Pager: [phone]

## 2019-04-20 NOTE — Telephone Encounter (Signed)
Called pt to inform her of lab results and to inquire on how she's doing since her procedure.  Unable to contact, LVM to return call

## 2019-04-26 ENCOUNTER — Inpatient Hospital Stay: Payer: Medicare Other | Attending: Nurse Practitioner | Admitting: Nurse Practitioner

## 2019-04-26 ENCOUNTER — Other Ambulatory Visit: Payer: Self-pay | Admitting: *Deleted

## 2019-04-26 ENCOUNTER — Inpatient Hospital Stay: Payer: Medicare Other

## 2019-04-26 DIAGNOSIS — Z7901 Long term (current) use of anticoagulants: Secondary | ICD-10-CM | POA: Insufficient documentation

## 2019-04-26 DIAGNOSIS — Z79899 Other long term (current) drug therapy: Secondary | ICD-10-CM | POA: Insufficient documentation

## 2019-04-26 DIAGNOSIS — N179 Acute kidney failure, unspecified: Secondary | ICD-10-CM | POA: Insufficient documentation

## 2019-04-26 DIAGNOSIS — H9201 Otalgia, right ear: Secondary | ICD-10-CM | POA: Diagnosis not present

## 2019-04-26 DIAGNOSIS — R1310 Dysphagia, unspecified: Secondary | ICD-10-CM | POA: Insufficient documentation

## 2019-04-26 DIAGNOSIS — C9002 Multiple myeloma in relapse: Secondary | ICD-10-CM | POA: Insufficient documentation

## 2019-04-26 DIAGNOSIS — R0981 Nasal congestion: Secondary | ICD-10-CM | POA: Insufficient documentation

## 2019-04-26 DIAGNOSIS — E876 Hypokalemia: Secondary | ICD-10-CM | POA: Insufficient documentation

## 2019-04-26 DIAGNOSIS — D649 Anemia, unspecified: Secondary | ICD-10-CM | POA: Insufficient documentation

## 2019-04-26 DIAGNOSIS — Z5189 Encounter for other specified aftercare: Secondary | ICD-10-CM

## 2019-04-26 DIAGNOSIS — F17210 Nicotine dependence, cigarettes, uncomplicated: Secondary | ICD-10-CM | POA: Insufficient documentation

## 2019-04-26 DIAGNOSIS — I129 Hypertensive chronic kidney disease with stage 1 through stage 4 chronic kidney disease, or unspecified chronic kidney disease: Secondary | ICD-10-CM | POA: Insufficient documentation

## 2019-04-26 NOTE — Progress Notes (Signed)
Virtual Visit Progress Note  Symptom Management Clinic [facility]  [phone] [phone]  I connected with KAYDINCE TOWLES on 04/26/19 at 11:15 AM EDT by telephone visit and verified that I am speaking with the correct person using two identifiers.   I discussed the limitations, risks, security and privacy concerns of performing an evaluation and management service by telemedicine and the availability of in-person appointments. I also discussed with the patient that there may be a patient responsible charge related to this service. The patient expressed understanding and agreed to proceed.   Other persons participating in the visit and their role in the encounter:   Patient's location: home Provider's location: clinic  Chief Complaint: Ear pain & sinus congestion    Patient Care Team: Center, [facility] as PCP - General (General Practice) [name], [name]   Name of the patient: Brandi [name]  [id number]  [birth year]   Date of visit: 04/26/19  Diagnosis-multiple myeloma  Chief complaint/ Reason for visit- URI  Heme/Onc history:  Oncology History Overview Note  # SEP 2018- MULTIPLE MYELOMA IgALamda [2.5 gm/dl; K/L= 88/1298]; STAGE III [beta 2 microglobulin=5.5] [presented with acute renal failure; anemia; NO hypercalcemia; Skeletal survey-Normal]; BMBx- 45% plasma cells; FISH-POSITIVE 11:14 translocation.[STANDARD-high RISK]/cyto-Normal; SEP 2018- PET- L3 posterior element lesion.   # 9/14- velcade SQ twice weekly/Dex 40 mg/week; OCT 5th 2018-Start R [82m]VD; 3cycles of RVD- PARTAL RESPONSE  # Jan 11th 2019-Dara-Rev-Dex; April 2019- BMBx- plasma cell -by CD-138/IHC-80% [baseline Sep 2018- 85% ]; HOLD transplant [dw Dr.Gasperatto]  # April 29th 2019 2019- carfil-Cyt-Dex; AUG 6th BMBx- 6% plasma cells; VGPR  # Autologous stem cell transplant on 06/15/18 [[facility]/  Dr.Gasperrato]  # may 1st week-2019- Maintenance Revlimid 10 mg 3w/1w  # 12/12- RIGHT JUGULAR DVT-x 343mn xarelto; finished April 2020; September 2020-EGD/dysphagia; Dr. AnVicente Males# Acute renal failure [Dr.Singh; Proteinuria 1.5gm/day ]; acyclovir/Asprin ------------------------------------------------------------------------------------------------------------   DIAGNOSIS: '[ ]'  MULTIPLE MYELOMA  STAGE: III/HIGH RISK ;GOALS: CONTROL  CURRENT/MOST RECENT THERAPY-maintenance Revlimid    [place] [place]  Chemotherapy   The patient had palonosetron (ALOXI) injection 0.25 mg, 0.25 mg, Intravenous,  Once, 6 of 7 cycles Administration: 0.25 mg (11/21/2017), 0.25 mg (11/29/2017), 0.25 mg (12/05/2017), 0.25 mg (12/21/2017), 0.25 mg (12/28/2017), 0.25 mg (01/04/2018), 0.25 mg (01/18/2018), 0.25 mg (01/25/2018), 0.25 mg (02/01/2018), 0.25 mg (02/15/2018), 0.25 mg (02/22/2018), 0.25 mg (03/01/2018), 0.25 mg (03/15/2018), 0.25 mg (03/22/2018), 0.25 mg (03/30/2018), 0.25 mg (04/13/2018), 0.25 mg (04/20/2018), 0.25 mg (04/27/2018) cyclophosphamide (CYTOXAN) 500 mg in sodium chloride 0.9 % 250 mL chemo infusion, 540 mg, Intravenous,  Once, 6 of 7 cycles Administration: 500 mg (11/21/2017), 500 mg (11/29/2017), 500 mg (12/05/2017), 500 mg (12/28/2017), 500 mg (01/04/2018), 500 mg (01/18/2018), 500 mg (01/25/2018), 500 mg (02/01/2018), 500 mg (02/15/2018), 500 mg (02/22/2018), 500 mg (03/01/2018), 500 mg (03/15/2018), 500 mg (03/30/2018), 500 mg (04/13/2018), 500 mg (04/20/2018), 500 mg (04/27/2018) carfilzomib (KYPROLIS) 36 mg in dextrose 5 % 50 mL chemo infusion, 20 mg/m2 = 36 mg, Intravenous, Once, 6 of 7 cycles Administration: 36 mg (11/21/2017), 36 mg (11/22/2017), 60 mg (11/29/2017), 60 mg (11/30/2017), 60 mg (12/05/2017), 60 mg (12/06/2017), 60 mg (12/21/2017), 60 mg (12/22/2017), 60 mg (12/28/2017), 60 mg (12/29/2017), 60 mg (01/04/2018), 60 mg (01/05/2018), 60 mg (01/18/2018), 60 mg (01/19/2018), 60 mg (01/25/2018), 60 mg (01/27/2018), 60 mg  (02/01/2018), 60 mg (02/02/2018), 60 mg (02/15/2018), 60 mg (02/16/2018), 60 mg (02/22/2018), 60 mg (03/01/2018),  60 mg (03/02/2018), 60 mg (03/15/2018), 60 mg (03/16/2018), 60 mg (03/23/2018), 60 mg (03/30/2018), 60 mg (03/31/2018), 60 mg (04/13/2018), 60 mg (04/14/2018), 60 mg (04/20/2018), 60 mg (04/21/2018), 60 mg (04/27/2018)  for chemotherapy treatment.      Interval history- Upper Respiratory Infection:  FLORENCE YEUNG presents with a report of symptoms of a URI, possible sinusitis.  Symptoms include right ear pain, congestion, coryza, cough, plugged sensation in the right ear and vomiting. Onset of symptoms was 4 days ago, progressively worsened since that time.  She also c/o itching eyes for the past  4 days. She is drinking moderate amounts of fluids. Evaluation to date: urgent care on Friday and was started on pataday eye drops. She has been taking mucinex sinus max (tylenol-guat home with some relief. She smokes 2-3 cigarettes per day. She did not have her labs done today. Denies fever. Tmax at home 98.9.  Denies neurologic complaints.  Denies easy bleeding or bruising.  Reports reduced appetite but denies weight loss.  Denies chest pain.  Denies constipation or diarrhea.  Denies urinary complaints.  Daily shortness of breath.  ECOG FS:2 - Symptomatic, <50% confined to bed  Review of systems- Review of Systems  Constitutional: Positive for chills and malaise/fatigue. Negative for fever and weight loss.  HENT: Positive for congestion, ear pain, hearing loss and sinus pain. Negative for ear discharge, nosebleeds, sore throat and tinnitus.   Eyes: Negative for blurred vision, double vision and redness.       Itchy eyes  Respiratory: Positive for cough and sputum production. Negative for hemoptysis, shortness of breath, wheezing and stridor.   Cardiovascular: Negative for chest pain, palpitations and leg swelling.  Gastrointestinal: Positive for nausea. Negative for abdominal pain, blood in stool,  constipation, diarrhea, melena and vomiting.  Genitourinary: Negative for dysuria and urgency.  Musculoskeletal: Negative for back pain, falls, joint pain and myalgias.  Skin: Negative for itching and rash.  Neurological: Positive for headaches. Negative for dizziness, tingling, sensory change, loss of consciousness and weakness.  Endo/Heme/Allergies: Negative for environmental allergies. Does not bruise/bleed easily.  Psychiatric/Behavioral: Negative for depression. The patient is not nervous/anxious and does not have insomnia.     Current treatment-maintenance Revlimid  No Known Allergies  Past Medical History:  Diagnosis Date  . Hypertension   . Multiple myeloma [facility])     Past Surgical History:  Procedure Laterality Date  . ESOPHAGOGASTRODUODENOSCOPY (EGD) WITH PROPOFOL N/A 03/30/2019   Procedure: ESOPHAGOGASTRODUODENOSCOPY (EGD) WITH PROPOFOL;  Surgeon: Jonathon Bellows, MD;  Location: [facility] ENDOSCOPY;  Service: Gastroenterology;  Laterality: N/A;  . IR FLUORO GUIDE PORT INSERTION RIGHT  12/16/2017    Social History   Socioeconomic History  . Marital status: Single    Spouse name: Not on file  . Number of children: Not on file  . Years of education: Not on file  . Highest education level: Not on file  Occupational History  . Not on file  Social Needs  . Financial resource strain: Not on file  . Food insecurity    Worry: Not on file    Inability: Not on file  . Transportation needs    Medical: Not on file    Non-medical: Not on file  Tobacco Use  . Smoking status: Current Some Day Smoker    Packs/day: 0.50    Types: Cigarettes  . Smokeless tobacco: Never Used  Substance and Sexual Activity  . Alcohol use: No  . Drug use: No  . Sexual activity: Not on  file  Lifestyle  . Physical activity    Days per week: Not on file    Minutes per session: Not on file  . Stress: Not on file  Relationships  . Social Herbalist on phone: Not on file    Gets together: Not  on file    Attends religious service: Not on file    Active member of club or organization: Not on file    Attends meetings of clubs or organizations: Not on file    Relationship status: Not on file  . Intimate partner violence    Fear of current or ex partner: Not on file    Emotionally abused: Not on file    Physically abused: Not on file    Forced sexual activity: Not on file  Other Topics Concern  . Not on file  Social History Narrative   Lives at home with family. Independent at baseline    Family History  Problem Relation Age of Onset  . Pneumonia Mother   . Seizures Father      Current Outpatient Medications:  .  acyclovir (ZOVIRAX) 400 MG tablet, TAKE 1 TABLET BY MOUTH ONCE DAILY TO  PREVENT  SHINGLES (Patient not taking: Reported on 03/26/2019), Disp: 30 tablet, Rfl: 0 .  albuterol (PROVENTIL HFA;VENTOLIN HFA) 108 (90 Base) MCG/ACT inhaler, Inhale 2 puffs into the lungs every 6 (six) hours as needed for wheezing or shortness of breath., Disp: 1 Inhaler, Rfl: 2 .  amLODipine (NORVASC) 10 MG tablet, Take 1 tablet by mouth once daily, Disp: 90 tablet, Rfl: 2 .  aspirin EC 325 MG tablet, Take 81 mg by mouth daily. , Disp: , Rfl:  .  ergocalciferol (VITAMIN D2) 1.25 MG (50000 UT) capsule, Take 1 capsule (50,000 Units total) by mouth once a week., Disp: 12 capsule, Rfl: 2 .  feeding supplement, ENSURE ENLIVE, (ENSURE ENLIVE) LIQD, Take 237 mLs by mouth 2 (two) times daily between meals., Disp: 60 Bottle, Rfl: 0 .  lenalidomide (REVLIMID) 10 MG capsule, Take 1 capsule (10 mg total) by mouth daily. Take for 21 days then hold for 7 days, Repeat every 28 days. (Patient not taking: Reported on 03/26/2019), Disp: 21 capsule, Rfl: 0 .  ondansetron (ZOFRAN) 8 MG tablet, One pill every 8 hours as needed for nausea/vomitting., Disp: 40 tablet, Rfl: 1 .  pantoprazole (PROTONIX) 40 MG tablet, Take 1 tablet (40 mg total) by mouth daily., Disp: 90 tablet, Rfl: 3 .  polyethylene glycol (MIRALAX /  GLYCOLAX) packet, Take 17 g by mouth daily as needed for mild constipation. (Patient not taking: Reported on 03/26/2019), Disp: 14 each, Rfl: 0 .  potassium chloride SA (K-DUR) 20 MEQ tablet, Take 1 tablet (20 mEq total) by mouth once for 1 dose., Disp: 30 tablet, Rfl: 6 .  prochlorperazine (COMPAZINE) 10 MG tablet, Take 1 tablet (10 mg total) by mouth every 6 (six) hours as needed for nausea or vomiting., Disp: 30 tablet, Rfl: 3 .  traMADol (ULTRAM) 50 MG tablet, Take 1 tablet (50 mg total) by mouth every 8 (eight) hours as needed., Disp: 60 tablet, Rfl: 0 No current facility-administered medications for this visit.   Facility-Administered Medications Ordered in Other Visits:  .  sodium chloride flush (NS) 0.9 % injection 10 mL, 10 mL, Intracatheter, PRN, [name], MD, 10 mL at 02/23/18 1400  Physical exam: Exam limited due to telemedicine  Physical Exam Constitutional:      General: She is not in acute  distress. HENT:     Mouth/Throat:     Comments: Sounds congested. Strong thick cough.  Neurological:     Mental Status: She is alert.      CMP Latest Ref Rng & Units 04/06/2019  Glucose 70 - 99 mg/dL 105(H)  BUN 8 - 23 mg/dL 13  Creatinine 0.44 - 1.00 mg/dL 0.31(L)  Sodium 135 - 145 mmol/L 143  Potassium 3.5 - 5.1 mmol/L 3.6  Chloride 98 - 111 mmol/L 110  CO2 22 - 32 mmol/L 24  Calcium 8.9 - 10.3 mg/dL 9.7  Total Protein 6.5 - 8.1 g/dL 6.4(L)  Total Bilirubin 0.3 - 1.2 mg/dL 0.6  Alkaline Phos 38 - 126 U/L 188(H)  AST 15 - 41 U/L 23  ALT 0 - 44 U/L 28   CBC Latest Ref Rng & Units 04/06/2019  WBC 4.0 - 10.5 K/uL 7.1  Hemoglobin 12.0 - 15.0 g/dL 9.4(L)  Hematocrit 36.0 - 46.0 % 30.1(L)  Platelets 150 - 400 K/uL 178    No images are attached to the encounter.  No results found.  Assessment and plan- Patient is a 68 y.o. female diagnosed with multiple myeloma, currently on Revlimid maintenance who presents to symptom management clinic with symptoms of URI.  Ear  Pain- symptoms concerning for otitis media vs sinusitis- in setting of multiple myeloma on revlimid, recommend antibiotic therapy. Start amoxicillin 875 mg BID x 5 days. Continue Mucinex for cough.  Start Tessalon  Smoking Cessation- encouraged patient to not smoke and to avoid second hand smoke particularly while she is ill.   Follow up with Dr. Rogue Bussing as scheduled. RTC if symptoms do not improve or worsen.    Visit Diagnosis 1. Right ear pain     Patient expressed understanding and was in agreement with this plan. She also understands that She can call clinic at any time with any questions, concerns, or complaints.   I discussed the assessment and treatment plan with the patient. The patient was provided an opportunity to ask questions and all were answered. The patient agreed with the plan and demonstrated an understanding of the instructions.   The patient was advised to call back or seek an in-person evaluation if the symptoms worsen or if the condition fails to improve as anticipated.   I provided 7 minutes of non face-to-face telephone visit time during this encounter, and > 50% was spent counseling as documented under my assessment & plan.  Thank you for allowing me to participate in the care of this very pleasant patient.   Beckey Rutter, DNP, AGNP-C [place] [facility] [place] (work cell) [phone] (office)  CC: Dr. Rogue Bussing

## 2019-04-26 NOTE — Progress Notes (Signed)
Called patient no answer.

## 2019-04-26 NOTE — Telephone Encounter (Signed)
Patient verbalized understanding of lab results. Patient states she is feeling good and did good after the dilation

## 2019-04-27 ENCOUNTER — Other Ambulatory Visit: Payer: Self-pay

## 2019-04-27 ENCOUNTER — Encounter: Payer: Self-pay | Admitting: Nurse Practitioner

## 2019-04-27 ENCOUNTER — Inpatient Hospital Stay (HOSPITAL_BASED_OUTPATIENT_CLINIC_OR_DEPARTMENT_OTHER): Payer: Medicare Other | Admitting: Internal Medicine

## 2019-04-27 ENCOUNTER — Inpatient Hospital Stay: Payer: Medicare Other

## 2019-04-27 ENCOUNTER — Encounter: Payer: Self-pay | Admitting: Internal Medicine

## 2019-04-27 ENCOUNTER — Telehealth: Payer: Self-pay | Admitting: *Deleted

## 2019-04-27 DIAGNOSIS — Z79899 Other long term (current) drug therapy: Secondary | ICD-10-CM | POA: Diagnosis not present

## 2019-04-27 DIAGNOSIS — E876 Hypokalemia: Secondary | ICD-10-CM | POA: Diagnosis not present

## 2019-04-27 DIAGNOSIS — Z7901 Long term (current) use of anticoagulants: Secondary | ICD-10-CM | POA: Diagnosis not present

## 2019-04-27 DIAGNOSIS — D649 Anemia, unspecified: Secondary | ICD-10-CM | POA: Diagnosis not present

## 2019-04-27 DIAGNOSIS — Z5189 Encounter for other specified aftercare: Secondary | ICD-10-CM

## 2019-04-27 DIAGNOSIS — R0981 Nasal congestion: Secondary | ICD-10-CM | POA: Diagnosis not present

## 2019-04-27 DIAGNOSIS — R1310 Dysphagia, unspecified: Secondary | ICD-10-CM | POA: Diagnosis not present

## 2019-04-27 DIAGNOSIS — I129 Hypertensive chronic kidney disease with stage 1 through stage 4 chronic kidney disease, or unspecified chronic kidney disease: Secondary | ICD-10-CM | POA: Diagnosis not present

## 2019-04-27 DIAGNOSIS — F17210 Nicotine dependence, cigarettes, uncomplicated: Secondary | ICD-10-CM | POA: Diagnosis not present

## 2019-04-27 DIAGNOSIS — C9002 Multiple myeloma in relapse: Secondary | ICD-10-CM

## 2019-04-27 DIAGNOSIS — N179 Acute kidney failure, unspecified: Secondary | ICD-10-CM | POA: Diagnosis not present

## 2019-04-27 LAB — CBC WITH DIFFERENTIAL/PLATELET
Abs Immature Granulocytes: 0.01 10*3/uL (ref 0.00–0.07)
Basophils Absolute: 0 10*3/uL (ref 0.0–0.1)
Basophils Relative: 0 %
Eosinophils Absolute: 0.3 10*3/uL (ref 0.0–0.5)
Eosinophils Relative: 4 %
HCT: 27.7 % — ABNORMAL LOW (ref 36.0–46.0)
Hemoglobin: 8.5 g/dL — ABNORMAL LOW (ref 12.0–15.0)
Immature Granulocytes: 0 %
Lymphocytes Relative: 49 %
Lymphs Abs: 3.9 10*3/uL (ref 0.7–4.0)
MCH: 26.2 pg (ref 26.0–34.0)
MCHC: 30.7 g/dL (ref 30.0–36.0)
MCV: 85.5 fL (ref 80.0–100.0)
Monocytes Absolute: 0.6 10*3/uL (ref 0.1–1.0)
Monocytes Relative: 7 %
Neutro Abs: 3.2 10*3/uL (ref 1.7–7.7)
Neutrophils Relative %: 40 %
Platelets: 218 10*3/uL (ref 150–400)
RBC: 3.24 MIL/uL — ABNORMAL LOW (ref 3.87–5.11)
RDW: 14.5 % (ref 11.5–15.5)
WBC: 8 10*3/uL (ref 4.0–10.5)
nRBC: 0 % (ref 0.0–0.2)

## 2019-04-27 LAB — COMPREHENSIVE METABOLIC PANEL
ALT: 25 U/L (ref 0–44)
AST: 28 U/L (ref 15–41)
Albumin: 3.2 g/dL — ABNORMAL LOW (ref 3.5–5.0)
Alkaline Phosphatase: 234 U/L — ABNORMAL HIGH (ref 38–126)
Anion gap: 8 (ref 5–15)
BUN: 12 mg/dL (ref 8–23)
CO2: 24 mmol/L (ref 22–32)
Calcium: 9.4 mg/dL (ref 8.9–10.3)
Chloride: 108 mmol/L (ref 98–111)
Creatinine, Ser: <0.3 mg/dL — ABNORMAL LOW (ref 0.44–1.00)
Glucose, Bld: 98 mg/dL (ref 70–99)
Potassium: 3.2 mmol/L — ABNORMAL LOW (ref 3.5–5.1)
Sodium: 140 mmol/L (ref 135–145)
Total Bilirubin: 0.6 mg/dL (ref 0.3–1.2)
Total Protein: 6.8 g/dL (ref 6.5–8.1)

## 2019-04-27 MED ORDER — BENZONATATE 100 MG PO CAPS: 200.0000 mg | ORAL_CAPSULE | Freq: Three times a day (TID) | ORAL | 0 refills | Status: DC | PRN

## 2019-04-27 MED ORDER — AMOXICILLIN 875 MG PO TABS: 875.0000 mg | ORAL_TABLET | Freq: Two times a day (BID) | ORAL | 0 refills | Status: AC

## 2019-04-27 MED ORDER — PREDNISONE 20 MG PO TABS: 40.0000 mg | ORAL_TABLET | Freq: Every day | ORAL | 0 refills | Status: DC

## 2019-04-27 MED ORDER — HYDROCOD POLST-CPM POLST ER 10-8 MG/5ML PO SUER: 5.0000 mL | Freq: Two times a day (BID) | ORAL | 0 refills | Status: DC | PRN

## 2019-04-27 NOTE — Telephone Encounter (Signed)
Recommend robitussin DM-OTC 10 ml TID prn for cough. Brandi [name]- Please inform pt. Thx

## 2019-04-27 NOTE — Telephone Encounter (Signed)
[facility] called repot ring that they received a prescription for Tussionex and it needs a prior authorization, but also they are not able to fill prescription as they cannot get drug in stock right now and are asking if youo would order something else. Please advise

## 2019-04-27 NOTE — Assessment & Plan Note (Addendum)
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  July 2020 myeloma markers-absent myeloma protein/normal kappa lambda light chain ratio.  #Continue to hold Revlimid given acute issues/see discussion below.  Will check multiple myeloma panel kappa lambda light chain again in 2 weeks given worsening anemia hemoglobin 8.5/see below  #Anemia worsening 8.5 unclear etiology-unlikely multiple myeloma; await above work-up.  Question acute infection.  #Cough/sinus pressure-acute bronchitis; do not suspect COVID.  Recommend Augmentin/prednisone/Tussionex.  # Dysphagia/ s/p EGD-dilatation- better; losing weight-unclear etiology.  Suspect from acute issues.  # Elevated LFTs-alk phos-positive for hepatitis A/as noted by GI.  Stable  # Bone lesions/pain stable last zometa on 11/27/2017.  Holding Zometa secondary to poor dentition.  Stable  # DISPOSITION: # follow up in 2 weeks- MD; labs-/mm panel/k-l light chains/ cbc/cmpDr.B

## 2019-04-27 NOTE — Telephone Encounter (Addendum)
Call returned to pharmacy,left message on voice mail for them and to patient and informed to change prescription to Robitussin DM over the counter 10 ml three times a day as needed cough

## 2019-04-27 NOTE — Progress Notes (Signed)
[place] OFFICE PROGRESS NOTE  Patient Care Team: [facility], [facility] as PCP - General ([employer]) [name], [name]  Cancer Staging No matching staging information was found for the patient.   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 AVicente Males # Acute renal failure [Dr.Singh; Proteinuria 1.5gm/day ]; acyclovir/Asprin ------------------------------------------------------------------------------------------------------------   DIAGNOSIS: '[ ]'$  MULTIPLE MYELOMA  STAGE: III/HIGH RISK ;GOALS: CONTROL  CURRENT/MOST RECENT THERAPY-maintenance Revlimid    Multiple myeloma in relapse ([place]  11/07/2017 -  Chemotherapy   The patient had palonosetron (ALOXI) injection 0.25 mg, 0.25 mg, Intravenous,  Once, 6 of 7 cycles Administration: 0.25 mg (11/21/2017), 0.25 mg (11/29/2017), 0.25 mg (12/05/2017), 0.25 mg (12/21/2017), 0.25 mg (12/28/2017), 0.25 mg (01/04/2018), 0.25 mg (01/18/2018), 0.25 mg (01/25/2018), 0.25 mg (02/01/2018), 0.25 mg (02/15/2018), 0.25 mg  (02/22/2018), 0.25 mg (03/01/2018), 0.25 mg (03/15/2018), 0.25 mg (03/22/2018), 0.25 mg (03/30/2018), 0.25 mg (04/13/2018), 0.25 mg (04/20/2018), 0.25 mg (04/27/2018) cyclophosphamide (CYTOXAN) 500 mg in sodium chloride 0.9 % 250 mL chemo infusion, 540 mg, Intravenous,  Once, 6 of 7 cycles Administration: 500 mg (11/21/2017), 500 mg (11/29/2017), 500 mg (12/05/2017), 500 mg (12/28/2017), 500 mg (01/04/2018), 500 mg (01/18/2018), 500 mg (01/25/2018), 500 mg (02/01/2018), 500 mg (02/15/2018), 500 mg (02/22/2018), 500 mg (03/01/2018), 500 mg (03/15/2018), 500 mg (03/30/2018), 500 mg (04/13/2018), 500 mg (04/20/2018), 500 mg (04/27/2018) carfilzomib (KYPROLIS) 36 mg in dextrose 5 % 50 mL chemo infusion, 20 mg/m2 = 36 mg, Intravenous, Once, 6 of 7 cycles Administration: 36 mg (11/21/2017), 36 mg (11/22/2017), 60 mg (11/29/2017), 60 mg (11/30/2017), 60 mg (12/05/2017), 60 mg (12/06/2017), 60 mg (12/21/2017), 60 mg (12/22/2017), 60 mg (12/28/2017), 60 mg (12/29/2017), 60 mg (01/04/2018), 60 mg (01/05/2018), 60 mg (01/18/2018), 60 mg (01/19/2018), 60 mg (01/25/2018), 60 mg (01/27/2018), 60 mg (02/01/2018), 60 mg (02/02/2018), 60 mg (02/15/2018), 60 mg (02/16/2018), 60 mg (02/22/2018), 60 mg (03/01/2018), 60 mg (03/02/2018), 60 mg (03/15/2018), 60 mg (03/16/2018), 60 mg (03/23/2018), 60 mg (03/30/2018), 60 mg (03/31/2018), 60 mg (04/13/2018), 60 mg (04/14/2018), 60 mg (04/20/2018), 60 mg (04/21/2018), 60 mg (04/27/2018)  for chemotherapy treatment.       INTERVAL HISTORY:  Brandi JUENGER89y.o.  female pleasant patient above history of relapsed multiple myeloma-currently on maintenance Revlimid; Revlimid is on hold for the last 6 weeks-as the patient has been feeling poorly.  Patient complains of cough; complains of shortness of breath on exertion.  No fevers.  No muscle aches or joint pains.  No nausea no vomiting.  Complains of sinus pressure.  Complains of earache.  Patient continues to lose weight.  She complains of poor appetite.  Complains of loss of taste.  Review of  Systems  Constitutional: Positive for malaise/fatigue.  Negative for chills, diaphoresis and fever.  HENT: Negative for nosebleeds and sore throat.   Eyes: Negative for double vision.  Respiratory: Negative for hemoptysis, sputum production and shortness of breath.   Cardiovascular: Negative for chest pain, palpitations and orthopnea.  Gastrointestinal: Negative for abdominal pain, blood in stool, constipation, heartburn and melena.       Difficulty swallowing.  Genitourinary: Negative for dysuria, frequency and urgency.  Skin: Negative.  Negative for itching and rash.  Neurological: Negative for dizziness, tingling, focal weakness, weakness and headaches.  Endo/Heme/Allergies: Does not bruise/bleed easily.  Psychiatric/Behavioral: Negative for depression. The patient is not nervous/anxious and does not have insomnia.     PAST MEDICAL HISTORY :  Past Medical History:  Diagnosis Date  . Hypertension   . Multiple myeloma ([place])     PAST SURGICAL HISTORY :   Past Surgical History:  Procedure Laterality Date  . ESOPHAGOGASTRODUODENOSCOPY (EGD) WITH PROPOFOL N/A 03/30/2019   Procedure: ESOPHAGOGASTRODUODENOSCOPY (EGD) WITH PROPOFOL;  Surgeon: Jonathon Bellows, MD;  Location: [facility] ENDOSCOPY;  Service: Gastroenterology;  Laterality: N/A;  . IR FLUORO GUIDE PORT INSERTION RIGHT  12/16/2017    FAMILY HISTORY :   Family History  Problem Relation Age of Onset  . Pneumonia Mother   . Seizures Father     SOCIAL HISTORY:   Social History   Tobacco Use  . Smoking status: Current Some Day Smoker    Packs/day: 0.50    Types: Cigarettes  . Smokeless tobacco: Never Used  Substance Use Topics  . Alcohol use: No  . Drug use: No    ALLERGIES:  has No Known Allergies.  MEDICATIONS:  Current Outpatient Medications  Medication Sig Dispense Refill  . acyclovir (ZOVIRAX) 400 MG tablet TAKE 1 TABLET BY MOUTH ONCE DAILY TO  PREVENT  SHINGLES 30 tablet 0  . albuterol (PROVENTIL HFA;VENTOLIN HFA) 108  (90 Base) MCG/ACT inhaler Inhale 2 puffs into the lungs every 6 (six) hours as needed for wheezing or shortness of breath. 1 Inhaler 2  . amLODipine (NORVASC) 10 MG tablet Take 1 tablet by mouth once daily 90 tablet 2  . amoxicillin (AMOXIL) 875 MG tablet Take 1 tablet (875 mg total) by mouth 2 (two) times daily for 5 days. 10 tablet 0  . aspirin EC 325 MG tablet Take 81 mg by mouth daily.     . ergocalciferol (VITAMIN D2) 1.25 MG (50000 UT) capsule Take 1 capsule (50,000 Units total) by mouth once a week. 12 capsule 2  . feeding supplement, ENSURE ENLIVE, (ENSURE ENLIVE) LIQD Take 237 mLs by mouth 2 (two) times daily between meals. 60 Bottle 0  . lenalidomide (REVLIMID) 10 MG capsule Take 1 capsule (10 mg total) by mouth daily. Take for 21 days then hold for 7 days, Repeat every 28 days. 21 capsule 0  . ondansetron (ZOFRAN) 8 MG tablet One pill every 8 hours as needed for nausea/vomitting. 40 tablet 1  . pantoprazole (PROTONIX) 40 MG tablet Take 1 tablet (40 mg total) by mouth daily. 90 tablet 3  . polyethylene glycol (MIRALAX / GLYCOLAX) packet Take 17 g by mouth daily as needed for mild constipation. 14 each 0  . prochlorperazine (COMPAZINE) 10 MG tablet Take 1 tablet (10 mg total) by mouth every 6 (six) hours as needed for nausea or vomiting. 30 tablet 3  . chlorpheniramine-HYDROcodone (TUSSIONEX) 10-8 MG/5ML SUER Take 5 mLs by mouth every 12 (twelve) hours as needed for cough. 140 mL 0  . potassium chloride SA (K-DUR) 20  MEQ tablet Take 1 tablet (20 mEq total) by mouth once for 1 dose. 30 tablet 6  . predniSONE (DELTASONE) 20 MG tablet Take 2 tablets (40 mg total) by mouth daily with breakfast. Once a day with food 30 tablet 0   No current facility-administered medications for this visit.    Facility-Administered Medications Ordered in Other Visits  Medication Dose Route Frequency Provider Last Rate Last Dose  . sodium chloride flush (NS) 0.9 % injection 10 mL  10 mL Intracatheter PRN  [name], MD   10 mL at 02/23/18 1400    PHYSICAL EXAMINATION: ECOG PERFORMANCE STATUS: 1 - Symptomatic but completely ambulatory  BP (!) 109/48 (BP Location: Right Arm, Patient Position: Sitting)   Pulse 92   Temp 97.8 F (36.6 C) (Tympanic)   Wt 123 lb 2 oz (55.8 kg)   SpO2 100%   BMI 23.26 kg/m   Filed Weights   04/27/19 1123  Weight: 123 lb 2 oz (55.8 kg)    Physical Exam  Constitutional: She is oriented to person, place, and time and well-developed, well-nourished, and in no distress.  Alone. She is walking herself.  HENT:  Head: Normocephalic and atraumatic.  Poor dentition.  Eyes: Pupils are equal, round, and reactive to light.  Neck: Normal range of motion. Neck supple.  Cardiovascular: Normal rate and regular rhythm.  Pulmonary/Chest: Effort normal and breath sounds normal. No respiratory distress. She has no wheezes.  Abdominal: Soft. Bowel sounds are normal. She exhibits no distension and no mass. There is no abdominal tenderness. There is no rebound and no guarding.  Musculoskeletal: Normal range of motion.        General: No tenderness.  Neurological: She is alert and oriented to person, place, and time.  Skin: Skin is warm.  Psychiatric: Affect normal.   LABORATORY DATA:  I have reviewed the data as listed    Component Value Date/Time   NA 140 04/27/2019 1040   K 3.2 (L) 04/27/2019 1040   CL 108 04/27/2019 1040   CO2 24 04/27/2019 1040   GLUCOSE 98 04/27/2019 1040   BUN 12 04/27/2019 1040   CREATININE <0.30 (L) 04/27/2019 1040   CALCIUM 9.4 04/27/2019 1040   PROT 6.8 04/27/2019 1040   ALBUMIN 3.2 (L) 04/27/2019 1040   AST 28 04/27/2019 1040   ALT 25 04/27/2019 1040   ALKPHOS 234 (H) 04/27/2019 1040   BILITOT 0.6 04/27/2019 1040   GFRNONAA NOT CALCULATED 04/27/2019 1040   GFRAA NOT CALCULATED 04/27/2019 1040    No results found for: SPEP, UPEP  Lab Results  Component Value Date   WBC 8.0 04/27/2019   NEUTROABS 3.2 04/27/2019    HGB 8.5 (L) 04/27/2019   HCT 27.7 (L) 04/27/2019   MCV 85.5 04/27/2019   PLT 218 04/27/2019      Chemistry      Component Value Date/Time   NA 140 04/27/2019 1040   K 3.2 (L) 04/27/2019 1040   CL 108 04/27/2019 1040   CO2 24 04/27/2019 1040   BUN 12 04/27/2019 1040   CREATININE <0.30 (L) 04/27/2019 1040      Component Value Date/Time   CALCIUM 9.4 04/27/2019 1040   ALKPHOS 234 (H) 04/27/2019 1040   AST 28 04/27/2019 1040   ALT 25 04/27/2019 1040   BILITOT 0.6 04/27/2019 1040       RADIOGRAPHIC STUDIES: I have personally reviewed the radiological images as listed and agreed with the findings in the report. No results found.  ASSESSMENT & PLAN:  Multiple myeloma in relapse [facility]) #Multiple myeloma stage III high-risk cytogenetics-status post KRD-VG PR ; status post autologousstem cell transplant on 06/15/18.  July 2020 myeloma markers-absent myeloma protein/normal kappa lambda light chain ratio.  #Continue to hold Revlimid given acute issues/see discussion below.  Will check multiple myeloma panel kappa lambda light chain again in 2 weeks given worsening anemia hemoglobin 8.5/see below  #Anemia worsening 8.5 unclear etiology-unlikely multiple myeloma; await above work-up.  Question acute infection.  #Cough/sinus pressure-acute bronchitis; do not suspect COVID.  Recommend Augmentin/prednisone/Tussionex.  # Dysphagia/ s/p EGD-dilatation- better; losing weight-unclear etiology.  Suspect from acute issues.  # Elevated LFTs-alk phos-positive for hepatitis A/as noted by GI.  Stable  # Bone lesions/pain stable last zometa on 11/27/2017.  Holding Zometa secondary to poor dentition.  Stable  # DISPOSITION: # follow up in 2 weeks- MD; labs-/mm panel/k-l light chains/ cbc/cmpDr.B  No orders of the defined types were placed in this encounter.  All questions were answered. The patient knows to call the clinic with any problems, questions or concerns.      [name],  MD 04/27/2019 1:35 PM

## 2019-04-27 NOTE — Progress Notes (Signed)
Patient here today for follow up.  Patient c/o SOB, nausea, vomiting and decrease in appetite.  Patient c/o cough, itchy eyes and runny nose for the past week.

## 2019-05-02 ENCOUNTER — Ambulatory Visit: Payer: Medicare Other | Admitting: Gastroenterology

## 2019-05-09 ENCOUNTER — Ambulatory Visit (INDEPENDENT_AMBULATORY_CARE_PROVIDER_SITE_OTHER): Payer: Medicare Other | Admitting: Gastroenterology

## 2019-05-09 ENCOUNTER — Other Ambulatory Visit: Payer: Self-pay

## 2019-05-09 VITALS — BP 113/65 | HR 68 | Temp 98.2°F | Ht 61.0 in | Wt 126.0 lb

## 2019-05-09 DIAGNOSIS — K219 Gastro-esophageal reflux disease without esophagitis: Secondary | ICD-10-CM | POA: Diagnosis not present

## 2019-05-09 DIAGNOSIS — R7989 Other specified abnormal findings of blood chemistry: Secondary | ICD-10-CM | POA: Diagnosis not present

## 2019-05-09 DIAGNOSIS — R1310 Dysphagia, unspecified: Secondary | ICD-10-CM | POA: Diagnosis not present

## 2019-05-09 NOTE — Progress Notes (Signed)
Jonathon Bellows MD, MRCP(U.K) [address]  [place]  [place], [place] [address]  Main: [phone]  Fax: [phone]   Primary Care Physician: [facility], [facility]  Primary Gastroenterologist:  Dr. Jonathon Bellows   Chief Complaint  Patient presents with  . Follow-up    Dysphagia    HPI: Brandi [name] is a 68 y.o. female was seen in my office on 03/26/2019 for dysphagia.  Lost over 10 pounds over a 2-week..  CT scan of the neck with contrast on 03/22/2019 was normal.  She is unable to tolerate a barium with contrast.  Ongoing issue with swallowing for solids and liquids for years.  Got worse recently. Current smoker.  Incidentally noted elevated alkaline phosphatase.  Right upper quadrant ultrasound was negative.  Interval history 03/26/2019-05/09/2019   03/30/2019: EGD: No obvious stricture noted but the balloon was passed through the esophagus and dilated to 15 mm and run through the length of the esophagus.  Biopsies showed no abnormality of the esophagus.  03/26/2019: PTH was very low inform Dr. Rogue Bussing probably secondary to myeloma.  IgA was also very elevated.  Suggest to check HFE mutation for elevated ferritin which could also be due to acute inflammation.  She feels much better with her swallowing.  Able to eat better.  Gained 3 pounds.  Taking Pepcid once a day. Current Outpatient Medications  Medication Sig Dispense Refill  . acyclovir (ZOVIRAX) 400 MG tablet TAKE 1 TABLET BY MOUTH ONCE DAILY TO  PREVENT  SHINGLES 30 tablet 0  . albuterol (PROVENTIL HFA;VENTOLIN HFA) 108 (90 Base) MCG/ACT inhaler Inhale 2 puffs into the lungs every 6 (six) hours as needed for wheezing or shortness of breath. 1 Inhaler 2  . amLODipine (NORVASC) 10 MG tablet Take 1 tablet by mouth once daily 90 tablet 2  . aspirin EC 325 MG tablet Take 81 mg by mouth daily.     . chlorpheniramine-HYDROcodone (TUSSIONEX) 10-8 MG/5ML SUER Take 5 mLs by mouth every 12 (twelve) hours as needed  for cough. 140 mL 0  . ergocalciferol (VITAMIN D2) 1.25 MG (50000 UT) capsule Take 1 capsule (50,000 Units total) by mouth once a week. 12 capsule 2  . feeding supplement, ENSURE ENLIVE, (ENSURE ENLIVE) LIQD Take 237 mLs by mouth 2 (two) times daily between meals. 60 Bottle 0  . lenalidomide (REVLIMID) 10 MG capsule Take 1 capsule (10 mg total) by mouth daily. Take for 21 days then hold for 7 days, Repeat every 28 days. 21 capsule 0  . ondansetron (ZOFRAN) 8 MG tablet One pill every 8 hours as needed for nausea/vomitting. 40 tablet 1  . pantoprazole (PROTONIX) 40 MG tablet Take 1 tablet (40 mg total) by mouth daily. 90 tablet 3  . polyethylene glycol (MIRALAX / GLYCOLAX) packet Take 17 g by mouth daily as needed for mild constipation. 14 each 0  . potassium chloride SA (K-DUR) 20 MEQ tablet Take 1 tablet (20 mEq total) by mouth once for 1 dose. 30 tablet 6  . predniSONE (DELTASONE) 20 MG tablet Take 2 tablets (40 mg total) by mouth daily with breakfast. Once a day with food 30 tablet 0  . prochlorperazine (COMPAZINE) 10 MG tablet Take 1 tablet (10 mg total) by mouth every 6 (six) hours as needed for nausea or vomiting. 30 tablet 3   No current facility-administered medications for this visit.    Facility-Administered Medications Ordered in Other Visits  Medication Dose Route Frequency Provider Last Rate Last Dose  .  sodium chloride flush (NS) 0.9 % injection 10 mL  10 mL Intracatheter PRN [name], MD   10 mL at 02/23/18 1400    Allergies as of 05/09/2019  . (No Known Allergies)    ROS:  General: Negative for anorexia, weight loss, fever, chills, fatigue, weakness. ENT: Negative for hoarseness, difficulty swallowing , nasal congestion. CV: Negative for chest pain, angina, palpitations, dyspnea on exertion, peripheral edema.  Respiratory: Negative for dyspnea at rest, dyspnea on exertion, cough, sputum, wheezing.  GI: See history of present illness. GU:  Negative for dysuria,  hematuria, urinary incontinence, urinary frequency, nocturnal urination.  Endo: Negative for unusual weight change.    Physical Examination:   BP 113/65   Pulse 68   Temp 98.2 F (36.8 C)   Ht 5\' 1"  (1.549 m)   Wt 126 lb (57.2 kg)   BMI 23.81 kg/m   General: Well-nourished, well-developed in no acute distress.  Eyes: No icterus. Conjunctivae pink. Mouth: Oropharyngeal mucosa moist and pink , no lesions erythema or exudate. Lungs: Clear to auscultation bilaterally. Non-labored. Heart: Regular rate and rhythm, no murmurs rubs or gallops.  Abdomen: Bowel sounds are normal, nontender, nondistended, no hepatosplenomegaly or masses, no abdominal bruits or hernia , no rebound or guarding.   Extremities: No lower extremity edema. No clubbing or deformities. Neuro: Alert and oriented x 3.  Grossly intact. Skin: Warm and dry, no jaundice.   Psych: Alert and cooperative, normal mood and affect.   Imaging Studies: No results found.  Assessment and Plan:   Brandi [name] is a 68 y.o. y/o female to follow-up for dysphagia.  Status post dilation to 15 mm empirically.  Since dilation she feels significantly better.  Able to eat better.  Gained 3 pounds.  On Pepcid once a day having some breakthrough reflux-like symptoms and I suggested increase to Pepcid twice daily.  Ferritin elevated at 825 but iron percentage saturation is 26% unlikely to be hemochromatosis but will complete work-up by checking for HFE gene mutation.  Follow-up as needed    Dr Jonathon Bellows  MD,MRCP [facility])

## 2019-05-09 NOTE — Addendum Note (Signed)
Addended by: Dorethea Clan on: 05/09/2019 11:10 AM   Modules accepted: Orders

## 2019-05-10 ENCOUNTER — Telehealth: Payer: Self-pay

## 2019-05-10 NOTE — Telephone Encounter (Signed)
Pre-visit assessment attempted prior to [place] appointment with Dr. Rogue Bussing on 05/11/2019. 1st attempt @ 14:00, I spoke with a man who answered the phone and said she was unavailable and to please call back in 15-20 minutes. 2nd attempt @ 14:20 - no answer / left a voicemail.

## 2019-05-11 ENCOUNTER — Inpatient Hospital Stay: Payer: Medicare Other

## 2019-05-11 ENCOUNTER — Other Ambulatory Visit: Payer: Self-pay

## 2019-05-11 ENCOUNTER — Encounter: Payer: Self-pay | Admitting: Internal Medicine

## 2019-05-11 ENCOUNTER — Inpatient Hospital Stay (HOSPITAL_BASED_OUTPATIENT_CLINIC_OR_DEPARTMENT_OTHER): Payer: Medicare Other | Admitting: Internal Medicine

## 2019-05-11 DIAGNOSIS — C9002 Multiple myeloma in relapse: Secondary | ICD-10-CM

## 2019-05-11 LAB — COMPREHENSIVE METABOLIC PANEL
ALT: 17 U/L (ref 0–44)
AST: 22 U/L (ref 15–41)
Albumin: 3.3 g/dL — ABNORMAL LOW (ref 3.5–5.0)
Alkaline Phosphatase: 150 U/L — ABNORMAL HIGH (ref 38–126)
Anion gap: 9 (ref 5–15)
BUN: 8 mg/dL (ref 8–23)
CO2: 23 mmol/L (ref 22–32)
Calcium: 9.2 mg/dL (ref 8.9–10.3)
Chloride: 109 mmol/L (ref 98–111)
Creatinine, Ser: 0.39 mg/dL — ABNORMAL LOW (ref 0.44–1.00)
GFR calc Af Amer: >60 mL/min (ref >60)
GFR calc non Af Amer: >60 mL/min (ref >60)
Glucose, Bld: 112 mg/dL — ABNORMAL HIGH (ref 70–99)
Potassium: 2.8 mmol/L — ABNORMAL LOW (ref 3.5–5.1)
Sodium: 141 mmol/L (ref 135–145)
Total Bilirubin: 0.5 mg/dL (ref 0.3–1.2)
Total Protein: 6.6 g/dL (ref 6.5–8.1)

## 2019-05-11 LAB — CBC WITH DIFFERENTIAL/PLATELET
Abs Immature Granulocytes: 0.01 10*3/uL (ref 0.00–0.07)
Basophils Absolute: 0 10*3/uL (ref 0.0–0.1)
Basophils Relative: 0 %
Eosinophils Absolute: 0.3 10*3/uL (ref 0.0–0.5)
Eosinophils Relative: 3 %
HCT: 28.6 % — ABNORMAL LOW (ref 36.0–46.0)
Hemoglobin: 8.8 g/dL — ABNORMAL LOW (ref 12.0–15.0)
Immature Granulocytes: 0 %
Lymphocytes Relative: 50 %
Lymphs Abs: 3.7 10*3/uL (ref 0.7–4.0)
MCH: 26.3 pg (ref 26.0–34.0)
MCHC: 30.8 g/dL (ref 30.0–36.0)
MCV: 85.6 fL (ref 80.0–100.0)
Monocytes Absolute: 0.6 10*3/uL (ref 0.1–1.0)
Monocytes Relative: 7 %
Neutro Abs: 3 10*3/uL (ref 1.7–7.7)
Neutrophils Relative %: 40 %
Platelets: 178 10*3/uL (ref 150–400)
RBC: 3.34 MIL/uL — ABNORMAL LOW (ref 3.87–5.11)
RDW: 15 % (ref 11.5–15.5)
WBC: 7.6 10*3/uL (ref 4.0–10.5)
nRBC: 0 % (ref 0.0–0.2)

## 2019-05-11 MED ORDER — NICOTINE 14 MG/24HR TD PT24: 14.0000 mg | MEDICATED_PATCH | Freq: Every day | TRANSDERMAL | 3 refills | Status: DC

## 2019-05-11 MED ORDER — ALBUTEROL SULFATE HFA 108 (90 BASE) MCG/ACT IN AERS: 2.0000 | INHALATION_SPRAY | Freq: Four times a day (QID) | RESPIRATORY_TRACT | 3 refills | Status: DC | PRN

## 2019-05-11 MED ORDER — ERGOCALCIFEROL 1.25 MG (50000 UT) PO CAPS: 50000.0000 [IU] | ORAL_CAPSULE | ORAL | 2 refills | Status: DC

## 2019-05-11 MED ORDER — PREDNISONE 20 MG PO TABS: ORAL_TABLET | ORAL | 0 refills | Status: DC

## 2019-05-11 NOTE — Progress Notes (Signed)
Patient stated that his appetite is poor. Patient stated that she current drinks 3 Ensures a day but would like a supplement to help her with her appetite.

## 2019-05-11 NOTE — Progress Notes (Signed)
[place] OFFICE PROGRESS NOTE  Patient Care Team: Center, [facility] as PCP - General ([place]) [name], [name]  Cancer Staging No matching staging information was found for the patient.   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[facility] 11/07/2017 -  Chemotherapy   The patient had palonosetron (ALOXI) injection 0.25 mg, 0.25 mg, Intravenous,  Once, 6 of 7 cycles Administration: 0.25 mg (11/21/2017), 0.25 mg (11/29/2017), 0.25 mg (12/05/2017), 0.25 mg (12/21/2017), 0.25 mg (12/28/2017), 0.25 mg (01/04/2018), 0.25 mg (01/18/2018), 0.25 mg (01/25/2018), 0.25 mg (02/01/2018), 0.25 mg (02/15/2018), 0.25 mg (02/22/2018), 0.25 mg  (03/01/2018), 0.25 mg (03/15/2018), 0.25 mg (03/22/2018), 0.25 mg (03/30/2018), 0.25 mg (04/13/2018), 0.25 mg (04/20/2018), 0.25 mg (04/27/2018) cyclophosphamide (CYTOXAN) 500 mg in sodium chloride 0.9 % 250 mL chemo infusion, 540 mg, Intravenous,  Once, 6 of 7 cycles Administration: 500 mg (11/21/2017), 500 mg (11/29/2017), 500 mg (12/05/2017), 500 mg (12/28/2017), 500 mg (01/04/2018), 500 mg (01/18/2018), 500 mg (01/25/2018), 500 mg (02/01/2018), 500 mg (02/15/2018), 500 mg (02/22/2018), 500 mg (03/01/2018), 500 mg (03/15/2018), 500 mg (03/30/2018), 500 mg (04/13/2018), 500 mg (04/20/2018), 500 mg (04/27/2018) carfilzomib (KYPROLIS) 36 mg in dextrose 5 % 50 mL chemo infusion, 20 mg/m2 = 36 mg, Intravenous, Once, 6 of 7 cycles Administration: 36 mg (11/21/2017), 36 mg (11/22/2017), 60 mg (11/29/2017), 60 mg (11/30/2017), 60 mg (12/05/2017), 60 mg (12/06/2017), 60 mg (12/21/2017), 60 mg (12/22/2017), 60 mg (12/28/2017), 60 mg (12/29/2017), 60 mg (01/04/2018), 60 mg (01/05/2018), 60 mg (01/18/2018), 60 mg (01/19/2018), 60 mg (01/25/2018), 60 mg (01/27/2018), 60 mg (02/01/2018), 60 mg (02/02/2018), 60 mg (02/15/2018), 60 mg (02/16/2018), 60 mg (02/22/2018), 60 mg (03/01/2018), 60 mg (03/02/2018), 60 mg (03/15/2018), 60 mg (03/16/2018), 60 mg (03/23/2018), 60 mg (03/30/2018), 60 mg (03/31/2018), 60 mg (04/13/2018), 60 mg (04/14/2018), 60 mg (04/20/2018), 60 mg (04/21/2018), 60 mg (04/27/2018)  for chemotherapy treatment.       INTERVAL HISTORY:  Brandi CASIMIR89.o.  female pleasant patient above history of relapsed multiple myeloma-currently on maintenance Revlimid; Revlimid is on hold for the last 2 months-as the patient has been feeling poorly.  In the interim evaluated by GI.  Patient complains of poor appetite.  Weight loss.  Swallowing is improved; not completely resolved.  Complains of stuffiness in the ears.    Review of Systems  Constitutional: Positive for malaise/fatigue. Negative for chills, diaphoresis and fever.  HENT: Negative for nosebleeds and sore  throat.   Eyes: Negative for double vision.  Respiratory: Negative  for hemoptysis, sputum production and shortness of breath.   Cardiovascular: Negative for chest pain, palpitations and orthopnea.  Gastrointestinal: Negative for abdominal pain, blood in stool, constipation, heartburn and melena.       Difficulty swallowing.  Genitourinary: Negative for dysuria, frequency and urgency.  Skin: Negative.  Negative for itching and rash.  Neurological: Negative for dizziness, tingling, focal weakness, weakness and headaches.  Endo/Heme/Allergies: Does not bruise/bleed easily.  Psychiatric/Behavioral: Negative for depression. The patient is not nervous/anxious and does not have insomnia.     PAST MEDICAL HISTORY :  Past Medical History:  Diagnosis Date  . Hypertension   . Multiple myeloma ([place])     PAST SURGICAL HISTORY :   Past Surgical History:  Procedure Laterality Date  . ESOPHAGOGASTRODUODENOSCOPY (EGD) WITH PROPOFOL N/A 03/30/2019   Procedure: ESOPHAGOGASTRODUODENOSCOPY (EGD) WITH PROPOFOL;  Surgeon: Jonathon Bellows, MD;  Location: [facility] ENDOSCOPY;  Service: Gastroenterology;  Laterality: N/A;  . IR FLUORO GUIDE PORT INSERTION RIGHT  12/16/2017    FAMILY HISTORY :   Family History  Problem Relation Age of Onset  . Pneumonia Mother   . Seizures Father     SOCIAL HISTORY:   Social History   Tobacco Use  . Smoking status: Current Some Day Smoker    Packs/day: 0.50    Types: Cigarettes  . Smokeless tobacco: Never Used  Substance Use Topics  . Alcohol use: No  . Drug use: No    ALLERGIES:  has No Known Allergies.  MEDICATIONS:  Current Outpatient Medications  Medication Sig Dispense Refill  . acyclovir (ZOVIRAX) 400 MG tablet TAKE 1 TABLET BY MOUTH ONCE DAILY TO  PREVENT  SHINGLES 30 tablet 0  . albuterol (VENTOLIN HFA) 108 (90 Base) MCG/ACT inhaler Inhale 2 puffs into the lungs every 6 (six) hours as needed for wheezing or shortness of breath. 8 g 3  . amLODipine (NORVASC)  10 MG tablet Take 1 tablet by mouth once daily 90 tablet 2  . aspirin EC 325 MG tablet Take 81 mg by mouth daily.     . chlorpheniramine-HYDROcodone (TUSSIONEX) 10-8 MG/5ML SUER Take 5 mLs by mouth every 12 (twelve) hours as needed for cough. 140 mL 0  . ergocalciferol (VITAMIN D2) 1.25 MG (50000 UT) capsule Take 1 capsule (50,000 Units total) by mouth once a week. 12 capsule 2  . feeding supplement, ENSURE ENLIVE, (ENSURE ENLIVE) LIQD Take 237 mLs by mouth 2 (two) times daily between meals. 60 Bottle 0  . ondansetron (ZOFRAN) 8 MG tablet One pill every 8 hours as needed for nausea/vomitting. 40 tablet 1  . pantoprazole (PROTONIX) 40 MG tablet Take 1 tablet (40 mg total) by mouth daily. 90 tablet 3  . polyethylene glycol (MIRALAX / GLYCOLAX) packet Take 17 g by mouth daily as needed for mild constipation. 14 each 0  . potassium chloride SA (K-DUR) 20 MEQ tablet Take 1 tablet (20 mEq total) by mouth once for 1 dose. 30 tablet 6  . predniSONE (DELTASONE) 20 MG tablet Take 2 pills once a day x 14 days; and then 1 pill a a day. Take with food 40 tablet 0  . prochlorperazine (COMPAZINE) 10 MG tablet Take 1 tablet (10 mg total) by mouth every 6 (six) hours as needed for nausea or vomiting. 30 tablet 3  . benzonatate (TESSALON) 100 MG capsule Take 2 capsules by mouth 3 (three) times daily.    [name] lenalidomide (REVLIMID) 10 MG capsule Take 1 capsule (10 mg total) by mouth daily. Take for  21 days then hold for 7 days, Repeat every 28 days. (Patient not taking: Reported on 05/11/2019) 21 capsule 0  . nicotine (NICODERM CQ - DOSED IN MG/24 HOURS) 14 mg/24hr patch Place 1 patch (14 mg total) onto the skin daily. 28 patch 3   No current facility-administered medications for this visit.    Facility-Administered Medications Ordered in Other Visits  Medication Dose Route Frequency Provider Last Rate Last Dose  . sodium chloride flush (NS) 0.9 % injection 10 mL  10 mL Intracatheter PRN [name], MD   10  mL at 02/23/18 1400    PHYSICAL EXAMINATION: ECOG PERFORMANCE STATUS: 1 - Symptomatic but completely ambulatory  BP 118/71 (BP Location: Right Arm, Patient Position: Sitting)   Pulse 82   Temp 98.1 F (36.7 C) (Tympanic)   Ht _0  (1.549 m)   Wt 124 lb (56.2 kg)   BMI 23.43 kg/m   Filed Weights   05/11/19 1050  Weight: 124 lb (56.2 kg)    Physical Exam  Constitutional: She is oriented to person, place, and time and well-developed, well-nourished, and in no distress.  Alone. She is walking herself.  HENT:  Head: Normocephalic and atraumatic.  Poor dentition.  Eyes: Pupils are equal, round, and reactive to light.  Neck: Normal range of motion. Neck supple.  Cardiovascular: Normal rate and regular rhythm.  Pulmonary/Chest: Effort normal and breath sounds normal. No respiratory distress. She has no wheezes.  Abdominal: Soft. Bowel sounds are normal. She exhibits no distension and no mass. There is no abdominal tenderness. There is no rebound and no guarding.  Musculoskeletal: Normal range of motion.        General: No tenderness.  Neurological: She is alert and oriented to person, place, and time.  Skin: Skin is warm.  Psychiatric: Affect normal.   LABORATORY DATA:  I have reviewed the data as listed    Component Value Date/Time   NA 141 05/11/2019 1033   K 2.8 (L) 05/11/2019 1033   CL 109 05/11/2019 1033   CO2 23 05/11/2019 1033   GLUCOSE 112 (H) 05/11/2019 1033   BUN 8 05/11/2019 1033   CREATININE 0.39 (L) 05/11/2019 1033   CALCIUM 9.2 05/11/2019 1033   PROT 6.6 05/11/2019 1033   ALBUMIN 3.3 (L) 05/11/2019 1033   AST 22 05/11/2019 1033   ALT 17 05/11/2019 1033   ALKPHOS 150 (H) 05/11/2019 1033   BILITOT 0.5 05/11/2019 1033   GFRNONAA >60 05/11/2019 1033   GFRAA >60 05/11/2019 1033    No results found for: SPEP, UPEP  Lab Results  Component Value Date   WBC 7.6 05/11/2019   NEUTROABS 3.0 05/11/2019   HGB 8.8 (L) 05/11/2019   HCT 28.6 (L) 05/11/2019    MCV 85.6 05/11/2019   PLT 178 05/11/2019      Chemistry      Component Value Date/Time   NA 141 05/11/2019 1033   K 2.8 (L) 05/11/2019 1033   CL 109 05/11/2019 1033   CO2 23 05/11/2019 1033   BUN 8 05/11/2019 1033   CREATININE 0.39 (L) 05/11/2019 1033      Component Value Date/Time   CALCIUM 9.2 05/11/2019 1033   ALKPHOS 150 (H) 05/11/2019 1033   AST 22 05/11/2019 1033   ALT 17 05/11/2019 1033   BILITOT 0.5 05/11/2019 1033       RADIOGRAPHIC STUDIES: I have personally reviewed the radiological images as listed and agreed with the findings in the report. No results found.  ASSESSMENT & PLAN:  IgA myeloma ([place]) #Multiple myeloma stage III high-risk cytogenetics-status post KRD-VG PR ; status post autologousstem cell transplant on 06/15/18.  July 2020 myeloma markers-absent myeloma protein/normal kappa lambda light chain ratio.  #Continue to hold Revlimid given acute issues/poor appetite/weight loss etc. we will repeat myeloma panel in 2 weeks-given the hemoglobin 8.5-see below  #Anemia worsening 8.5 unclear etiology-unlikely multiple myeloma; await above work-up.  Question acute infection.  # Hypoklamie- 2.8 recommend k dur bid.   # Cough/sinus pressure-claritin D/ nicotine ptach  # Dysphagia/ s/p EGD-dilatation- better; losing weight-unclear etiology.  Recommend prednisone.  # Elevated LFTs-alk phos-positive for hepatitis A/as noted by GI.  Stable  # Bone lesions/pain stable last zometa on 11/27/2017.  Holding Zometa secondary to poor dentition.  Stable  # DISPOSITION: # follow up in 2 weeks- NP; labs-cbc/cmp-/mm panel/k-l light chains/ B12/folic acid.  # Follow up with 4 weeks; MD- cbc/bmp-Dr.B  Orders Placed This Encounter  Procedures  . CBC with Differential    Standing Status:   Future    Standing Expiration Date:   05/10/2020  . Comprehensive metabolic panel    Standing Status:   Future    Standing Expiration Date:   05/10/2020  . Multiple Myeloma Panel  (SPEP&IFE w/QIG)    Standing Status:   Future    Standing Expiration Date:   05/10/2020  . Kappa/lambda light chains    Standing Status:   Future    Standing Expiration Date:   05/10/2020  . Folate    Standing Status:   Future    Standing Expiration Date:   05/10/2020  . Vitamin B12    Standing Status:   Future    Standing Expiration Date:   05/10/2020  . CBC with Differential    Standing Status:   Future    Standing Expiration Date:   05/10/2020  . Basic metabolic panel    Standing Status:   Future    Standing Expiration Date:   05/10/2020   All questions were answered. The patient knows to call the clinic with any problems, questions or concerns.      [name], MD 05/12/2019 5:54 AM

## 2019-05-11 NOTE — Assessment & Plan Note (Addendum)
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  July 2020 myeloma markers-absent myeloma protein/normal kappa lambda light chain ratio.  #Continue to hold Revlimid given acute issues/poor appetite/weight loss etc. we will repeat myeloma panel in 2 weeks-given the hemoglobin 8.5-see below  #Anemia worsening 8.5 unclear etiology-unlikely multiple myeloma; await above work-up.  Question acute infection.  # Hypoklamie- 2.8 recommend k dur bid.   # Cough/sinus pressure-claritin D/ nicotine ptach  # Dysphagia/ s/p EGD-dilatation- better; losing weight-unclear etiology.  Recommend prednisone.  # Elevated LFTs-alk phos-positive for hepatitis A/as noted by GI.  Stable  # Bone lesions/pain stable last zometa on 11/27/2017.  Holding Zometa secondary to poor dentition.  Stable  # DISPOSITION: # follow up in 2 weeks- NP; labs-cbc/cmp-/mm panel/k-l light chains/ B12/folic acid.  # Follow up with 4 weeks; MD- cbc/bmp-Dr.B

## 2019-05-15 ENCOUNTER — Encounter: Payer: Self-pay | Admitting: Gastroenterology

## 2019-05-15 LAB — HEMOCHROMATOSIS DNA-PCR(C282Y,H63D)

## 2019-05-21 ENCOUNTER — Telehealth: Payer: Self-pay

## 2019-05-21 NOTE — Telephone Encounter (Signed)
Nutrition  Patient identified on Malnutrition Screening report for poor appetite and weight loss.  Called patient to introduce self and service at [facility].  No answer.  Left message with call back number.  Tierre Gerard B. Zenia Resides, [place], [place] Registered Dietitian [phone] (pager)

## 2019-05-25 ENCOUNTER — Inpatient Hospital Stay: Payer: Medicare Other

## 2019-05-25 ENCOUNTER — Inpatient Hospital Stay (HOSPITAL_BASED_OUTPATIENT_CLINIC_OR_DEPARTMENT_OTHER): Payer: Medicare Other | Admitting: Oncology

## 2019-05-25 ENCOUNTER — Other Ambulatory Visit: Payer: Self-pay

## 2019-05-25 DIAGNOSIS — C9000 Multiple myeloma not having achieved remission: Secondary | ICD-10-CM

## 2019-05-25 DIAGNOSIS — C9002 Multiple myeloma in relapse: Secondary | ICD-10-CM

## 2019-05-25 LAB — CBC WITH DIFFERENTIAL/PLATELET
Abs Immature Granulocytes: 0.01 10*3/uL (ref 0.00–0.07)
Basophils Absolute: 0 10*3/uL (ref 0.0–0.1)
Basophils Relative: 0 %
Eosinophils Absolute: 0.3 10*3/uL (ref 0.0–0.5)
Eosinophils Relative: 4 %
HCT: 31.1 % — ABNORMAL LOW (ref 36.0–46.0)
Hemoglobin: 9.5 g/dL — ABNORMAL LOW (ref 12.0–15.0)
Immature Granulocytes: 0 %
Lymphocytes Relative: 51 %
Lymphs Abs: 4.1 10*3/uL — ABNORMAL HIGH (ref 0.7–4.0)
MCH: 26.2 pg (ref 26.0–34.0)
MCHC: 30.5 g/dL (ref 30.0–36.0)
MCV: 85.9 fL (ref 80.0–100.0)
Monocytes Absolute: 0.5 10*3/uL (ref 0.1–1.0)
Monocytes Relative: 6 %
Neutro Abs: 3.1 10*3/uL (ref 1.7–7.7)
Neutrophils Relative %: 39 %
Platelets: 190 10*3/uL (ref 150–400)
RBC: 3.62 MIL/uL — ABNORMAL LOW (ref 3.87–5.11)
RDW: 15.1 % (ref 11.5–15.5)
Smear Review: NORMAL
WBC: 7.9 10*3/uL (ref 4.0–10.5)
nRBC: 0 % (ref 0.0–0.2)

## 2019-05-25 LAB — COMPREHENSIVE METABOLIC PANEL
ALT: 18 U/L (ref 0–44)
AST: 21 U/L (ref 15–41)
Albumin: 3.5 g/dL (ref 3.5–5.0)
Alkaline Phosphatase: 117 U/L (ref 38–126)
Anion gap: 9 (ref 5–15)
BUN: 16 mg/dL (ref 8–23)
CO2: 23 mmol/L (ref 22–32)
Calcium: 9.3 mg/dL (ref 8.9–10.3)
Chloride: 109 mmol/L (ref 98–111)
Creatinine, Ser: 0.47 mg/dL (ref 0.44–1.00)
GFR calc Af Amer: >60 mL/min (ref >60)
GFR calc non Af Amer: >60 mL/min (ref >60)
Glucose, Bld: 143 mg/dL — ABNORMAL HIGH (ref 70–99)
Potassium: 3 mmol/L — ABNORMAL LOW (ref 3.5–5.1)
Sodium: 141 mmol/L (ref 135–145)
Total Bilirubin: 0.4 mg/dL (ref 0.3–1.2)
Total Protein: 6.8 g/dL (ref 6.5–8.1)

## 2019-05-25 LAB — FOLATE: Folate: 8.6 ng/mL (ref >5.9)

## 2019-05-25 LAB — VITAMIN B12: Vitamin B-12: 358 pg/mL (ref 180–914)

## 2019-05-25 NOTE — Assessment & Plan Note (Addendum)
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  July 2020 myeloma markers-absent myeloma protein/normal kappa lambda light chain ratio.  #Continue to hold Revlimid given acute issues/poor appetite/weight loss etc- Repeat labs pending from today. Hemoglobin improved- 9.5  #Anemia improving from 2 weeks ago- 9.5 today.  #Hypokalemia: 3.0 today.  Improving.  Continue K-Dur twice daily.  # Cough/sinus pressure-improved.  #Shortness of breath: Likely due to COPD and smoking history.  Encouraged her to cut back on smoking and increase physical activity.  # Dysphagia/ s/p EGD-dilatation-denies dysphagia but has continued weight loss.  Appetite is improved.  Continue prednisone.  # Elevated LFTs-alk phos-positive for hepatitis A/as noted by GI.  Stable  # Bone lesions/pain stable last zometa on 11/27/2017.  Holding Zometa secondary to poor dentition.  Stable  # DISPOSITION: # RTC as scheduled in 2 weeks to see Dr. Rogue Bussing.

## 2019-05-25 NOTE — Progress Notes (Signed)
[place] OFFICE PROGRESS NOTE  Patient Care Team: Center, [facility] as PCP - General ([place]) [name], [name]  Cancer Staging No matching staging information was found for the patient.   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 AnVicente Males# Acute renal failure [Dr.Singh; Proteinuria 1.5gm/day ]; acyclovir/Asprin ------------------------------------------------------------------------------------------------------------   DIAGNOSIS: '[ ]'  MULTIPLE MYELOMA  STAGE: III/HIGH RISK ;GOALS: CONTROL  CURRENT/MOST RECENT THERAPY-maintenance Revlimid    IgA myeloma ([place] 11/07/2017 -  Chemotherapy   The patient had palonosetron (ALOXI) injection 0.25 mg, 0.25 mg, Intravenous,  Once, 6 of 7 cycles Administration: 0.25 mg (11/21/2017), 0.25 mg (11/29/2017), 0.25 mg (12/05/2017), 0.25 mg (12/21/2017), 0.25 mg (12/28/2017), 0.25 mg (01/04/2018), 0.25 mg (01/18/2018), 0.25 mg (01/25/2018), 0.25 mg (02/01/2018), 0.25 mg (02/15/2018), 0.25 mg (02/22/2018), 0.25 mg  (03/01/2018), 0.25 mg (03/15/2018), 0.25 mg (03/22/2018), 0.25 mg (03/30/2018), 0.25 mg (04/13/2018), 0.25 mg (04/20/2018), 0.25 mg (04/27/2018) cyclophosphamide (CYTOXAN) 500 mg in sodium chloride 0.9 % 250 mL chemo infusion, 540 mg, Intravenous,  Once, 6 of 7 cycles Administration: 500 mg (11/21/2017), 500 mg (11/29/2017), 500 mg (12/05/2017), 500 mg (12/28/2017), 500 mg (01/04/2018), 500 mg (01/18/2018), 500 mg (01/25/2018), 500 mg (02/01/2018), 500 mg (02/15/2018), 500 mg (02/22/2018), 500 mg (03/01/2018), 500 mg (03/15/2018), 500 mg (03/30/2018), 500 mg (04/13/2018), 500 mg (04/20/2018), 500 mg (04/27/2018) carfilzomib (KYPROLIS) 36 mg in dextrose 5 % 50 mL chemo infusion, 20 mg/m2 = 36 mg, Intravenous, Once, 6 of 7 cycles Administration: 36 mg (11/21/2017), 36 mg (11/22/2017), 60 mg (11/29/2017), 60 mg (11/30/2017), 60 mg (12/05/2017), 60 mg (12/06/2017), 60 mg (12/21/2017), 60 mg (12/22/2017), 60 mg (12/28/2017), 60 mg (12/29/2017), 60 mg (01/04/2018), 60 mg (01/05/2018), 60 mg (01/18/2018), 60 mg (01/19/2018), 60 mg (01/25/2018), 60 mg (01/27/2018), 60 mg (02/01/2018), 60 mg (02/02/2018), 60 mg (02/15/2018), 60 mg (02/16/2018), 60 mg (02/22/2018), 60 mg (03/01/2018), 60 mg (03/02/2018), 60 mg (03/15/2018), 60 mg (03/16/2018), 60 mg (03/23/2018), 60 mg (03/30/2018), 60 mg (03/31/2018), 60 mg (04/13/2018), 60 mg (04/14/2018), 60 mg (04/20/2018), 60 mg (04/21/2018), 60 mg (04/27/2018)  for chemotherapy treatment.       INTERVAL HISTORY:  Brandi [id number].  female pleasant patient above history of relapsed multiple myeloma-currently on maintenance Revlimid; Revlimid is on hold for the last 2 months-as the patient has been feeling poorly.  In the interim evaluated by GI for dysphagia.  Status post dilatation to 15 mm empirically.  Since dilatation she feels significantly better.  She is able to eat better she is gained 3 pounds.  On Pepcid BID.   Patient complains of continued poor appetite.  Weight is down 1 pound since last visit.  States her appetite is  "great".  She  eats whatever she wants to and denies any further episodes of dysphagia.  She admits to occasional shortness of breath with exertion mainly when doing ADLs.  She does walk her dog a few times per week.  Most of her days are spent sitting in her recliner watching Netflix.   Review of Systems  Constitutional: Positive for malaise/fatigue and weight loss. Negative for chills and fever.  HENT: Negative for congestion, ear pain and tinnitus.   Eyes: Negative.  Negative for blurred vision and double vision.  Respiratory: Negative.  Negative for cough, sputum production and shortness of breath.   Cardiovascular: Negative.  Negative for chest pain, palpitations and leg swelling.  Gastrointestinal: Negative.  Negative for abdominal pain, constipation, diarrhea, nausea and vomiting.  Genitourinary: Negative for dysuria, frequency and urgency.  Musculoskeletal: Negative for back pain and falls.  Skin: Negative.  Negative for rash.  Neurological: Negative.  Negative for weakness and headaches.  Endo/Heme/Allergies: Negative.  Does not bruise/bleed easily.  Psychiatric/Behavioral: Negative.  Negative for depression. The patient is not nervous/anxious and does not have insomnia.     PAST MEDICAL HISTORY :  Past Medical History:  Diagnosis Date  . Hypertension   . Multiple myeloma ([place])     PAST SURGICAL HISTORY :   Past Surgical History:  Procedure Laterality Date  . ESOPHAGOGASTRODUODENOSCOPY (EGD) WITH PROPOFOL N/A 03/30/2019   Procedure: ESOPHAGOGASTRODUODENOSCOPY (EGD) WITH PROPOFOL;  Surgeon: Jonathon Bellows, MD;  [facility] END[facility]COPY;  Service: Gastroenterology;  Laterality: N/A;  . IR FLUORO GUIDE PORT INSERTION RIGHT  12/16/2017    FAMILY HISTORY :   Family History  Problem Relation Age of Onset  . Pneumonia Mother   . Seizures Father     SOCIAL HISTORY:   Social History   Tobacco Use  . Smoking status: Current Some Day Smoker    Packs/day: 0.50    Types:  Cigarettes  . Smokeless tobacco: Never Used  Substance Use Topics  . Alcohol use: No  . Drug use: No    ALLERGIES:  has No Known Allergies.  MEDICATIONS:  Current Outpatient Medications  Medication Sig Dispense Refill  . acyclovir (ZOVIRAX) 400 MG tablet TAKE 1 TABLET BY MOUTH ONCE DAILY TO  PREVENT  SHINGLES 30 tablet 0  . albuterol (VENTOLIN HFA) 108 (90 Base) MCG/ACT inhaler Inhale 2 puffs into the lungs every 6 (six) hours as needed for wheezing or shortness of breath. 8 g 3  . amLODipine (NORVASC) 10 MG tablet Take 1 tablet by mouth once daily 90 tablet 2  . aspirin EC 325 MG tablet Take 81 mg by mouth daily.     . benzonatate (TESSALON) 100 MG capsule Take 2 capsules by mouth 3 (three) times daily.    . chlorpheniramine-HYDROcodone (TUSSIONEX) 10-8 MG/5ML SUER Take 5 mLs by mouth every 12 (twelve) hours as needed for cough. 140 mL 0  . ergocalciferol (VITAMIN D2) 1.25 MG (50000 UT) capsule Take 1 capsule (50,000 Units total) by mouth once a week. 12 capsule 2  . feeding supplement, ENSURE ENLIVE, (ENSURE ENLIVE) LIQD Take 237 mLs by mouth 2 (two) times daily between meals. 60 Bottle 0  . nicotine (NICODERM CQ - DOSED IN MG/24 HOURS) 14 mg/24hr patch Place 1 patch (14 mg total) onto the skin daily. 28 patch 3  . ondansetron (ZOFRAN) 8 MG tablet One pill every 8 hours as needed for nausea/vomitting. 40 tablet 1  . pantoprazole (PROTONIX) 40 MG tablet Take 1 tablet (40 mg total) by mouth daily. [place]  tablet 3  . polyethylene glycol (MIRALAX / GLYCOLAX) packet Take 17 g by mouth daily as needed for mild constipation. 14 each 0  . predniSONE (DELTASONE) 20 MG tablet Take 2 pills once a day x 14 days; and then 1 pill a a day. Take with food 40 tablet 0  . prochlorperazine (COMPAZINE) 10 MG tablet Take 1 tablet (10 mg total) by mouth every 6 (six) hours as needed for nausea or vomiting. 30 tablet 3  . lenalidomide (REVLIMID) 10 MG capsule Take 1 capsule (10 mg total) by mouth daily. Take for 21  days then hold for 7 days, Repeat every 28 days. (Patient not taking: Reported on 05/11/2019) 21 capsule 0  . potassium chloride SA (K-DUR) 20 MEQ tablet Take 1 tablet (20 mEq total) by mouth once for 1 dose. 30 tablet 6   No current facility-administered medications for this visit.    Facility-Administered Medications Ordered in Other Visits  Medication Dose Route Frequency Provider Last Rate Last Dose  . sodium chloride flush (NS) 0.9 % injection 10 mL  10 mL Intracatheter PRN [name], MD   10 mL at 02/23/18 1400    PHYSICAL EXAMINATION: ECOG PERFORMANCE STATUS: 1 - Symptomatic but completely ambulatory  BP 125/65 (BP Location: Right Leg, Patient Position: Sitting)   Pulse 93   Temp 98.2 F (36.8 C)   Resp 18   Wt 123 lb 6.4 oz (56 kg)   SpO2 100%   BMI 23.32 kg/m   Filed Weights   05/25/19 0943  Weight: 123 lb 6.4 oz (56 kg)    Physical Exam  Constitutional: She is oriented to person, place, and time and well-developed, well-nourished, and in no distress. Vital signs are normal.  HENT:  Head: Normocephalic and atraumatic.  Eyes: Pupils are equal, round, and reactive to light.  Neck: Normal range of motion.  Cardiovascular: Normal rate, regular rhythm and normal heart sounds.  No murmur heard. Pulmonary/Chest: Effort normal and breath sounds normal. She has no wheezes.  Abdominal: Soft. Normal appearance and bowel sounds are normal. She exhibits no distension. There is no abdominal tenderness.  Musculoskeletal: Normal range of motion.        General: No edema.  Neurological: She is alert and oriented to person, place, and time. Gait normal.  Skin: Skin is warm and dry. No rash noted.  Psychiatric: Mood, memory, affect and judgment normal.   LABORATORY DATA:  I have reviewed the data as listed    Component Value Date/Time   NA 141 05/25/2019 0924   K 3.0 (L) 05/25/2019 0924   CL 109 05/25/2019 0924   CO2 23 05/25/2019 0924   GLUCOSE 143 (H)  05/25/2019 0924   BUN 16 05/25/2019 0924   CREATININE 0.47 05/25/2019 0924   CALCIUM 9.3 05/25/2019 0924   PROT 6.8 05/25/2019 0924   ALBUMIN 3.5 05/25/2019 0924   AST 21 05/25/2019 0924   ALT 18 05/25/2019 0924   ALKPHOS 117 05/25/2019 0924   BILITOT 0.4 05/25/2019 0924   GFRNONAA >60 05/25/2019 0924   GFRAA >60 05/25/2019 0924    No results found for: SPEP, UPEP  Lab Results  Component Value Date   WBC 7.9 05/25/2019   NEUTROABS 3.1 05/25/2019   HGB 9.5 (L) 05/25/2019   HCT 31.1 (L) 05/25/2019   MCV 85.9 05/25/2019   PLT 190 05/25/2019      Chemistry      Component Value Date/Time   NA 141 05/25/2019 0924  K 3.0 (L) 05/25/2019 0924   CL 109 05/25/2019 0924   CO2 23 05/25/2019 0924   BUN 16 05/25/2019 0924   CREATININE 0.47 05/25/2019 0924      Component Value Date/Time   CALCIUM 9.3 05/25/2019 0924   ALKPHOS 117 05/25/2019 0924   AST 21 05/25/2019 0924   ALT 18 05/25/2019 0924   BILITOT 0.4 05/25/2019 0924       RADIOGRAPHIC STUDIES: I have personally reviewed the radiological images as listed and agreed with the findings in the report. No results found.   ASSESSMENT & PLAN:  IgA myeloma ([place]) #Multiple myeloma stage III high-risk cytogenetics-status post KRD-VG PR ; status post autologousstem cell transplant on 06/15/18.  July 2020 myeloma markers-absent myeloma protein/normal kappa lambda light chain ratio.  #Continue to hold Revlimid given acute issues/poor appetite/weight loss etc- Repeat labs pending from today. Hemoglobin improved- 9.5  #Anemia improving from 2 weeks ago- 9.5 today.  #Hypokalemia: 3.0 today.  Improving.  Continue K-Dur twice daily.  # Cough/sinus pressure-improved.  #Shortness of breath: Likely due to COPD and smoking history.  Encouraged her to cut back on smoking and increase physical activity.  # Dysphagia/ s/p EGD-dilatation-denies dysphagia but has continued weight loss.  Appetite is improved.  Continue prednisone.  #  Elevated LFTs-alk phos-positive for hepatitis A/as noted by GI.  Stable  # Bone lesions/pain stable last zometa on 11/27/2017.  Holding Zometa secondary to poor dentition.  Stable  # DISPOSITION: # RTC as scheduled in 2 weeks to see Dr. Rogue Bussing.    No orders of the defined types were placed in this encounter.  All questions were answered. The patient knows to call the clinic with any problems, questions or concerns.      Jacquelin Hawking, NP 05/25/2019 12:48 PM

## 2019-05-28 LAB — MULTIPLE MYELOMA PANEL, SERUM
Albumin SerPl Elph-Mcnc: 3.2 g/dL (ref 2.9–4.4)
Albumin/Glob SerPl: 1.3 (ref 0.7–1.7)
Alpha 1: 0.2 g/dL (ref 0.0–0.4)
Alpha2 Glob SerPl Elph-Mcnc: 0.8 g/dL (ref 0.4–1.0)
B-Globulin SerPl Elph-Mcnc: 0.8 g/dL (ref 0.7–1.3)
Gamma Glob SerPl Elph-Mcnc: 0.8 g/dL (ref 0.4–1.8)
Globulin, Total: 2.6 g/dL (ref 2.2–3.9)
IgA: 145 mg/dL (ref 87–352)
IgG (Immunoglobin G), Serum: 1025 mg/dL (ref 586–1602)
IgM (Immunoglobulin M), Srm: 37 mg/dL (ref 26–217)
Total Protein ELP: 5.8 g/dL — ABNORMAL LOW (ref 6.0–8.5)

## 2019-05-28 LAB — KAPPA/LAMBDA LIGHT CHAINS
Kappa free light chain: 14.1 mg/L (ref 3.3–19.4)
Kappa, lambda light chain ratio: 0.32 (ref 0.26–1.65)
Lambda free light chains: 44.7 mg/L — ABNORMAL HIGH (ref 5.7–26.3)

## 2019-06-07 ENCOUNTER — Other Ambulatory Visit: Payer: Self-pay

## 2019-06-07 NOTE — Progress Notes (Signed)
Pre-visit assessment prior to appointment on 06/08/2019. Brandi [name] reports that she is feeling much better and has gained some weight (now 129.1). No new concerns.

## 2019-06-08 ENCOUNTER — Inpatient Hospital Stay: Payer: Medicare Other | Attending: Internal Medicine

## 2019-06-08 ENCOUNTER — Other Ambulatory Visit: Payer: Self-pay

## 2019-06-08 ENCOUNTER — Inpatient Hospital Stay (HOSPITAL_BASED_OUTPATIENT_CLINIC_OR_DEPARTMENT_OTHER): Payer: Medicare Other | Admitting: Internal Medicine

## 2019-06-08 DIAGNOSIS — C9002 Multiple myeloma in relapse: Secondary | ICD-10-CM

## 2019-06-08 DIAGNOSIS — Z79899 Other long term (current) drug therapy: Secondary | ICD-10-CM | POA: Diagnosis not present

## 2019-06-08 DIAGNOSIS — C9000 Multiple myeloma not having achieved remission: Secondary | ICD-10-CM | POA: Diagnosis present

## 2019-06-08 LAB — CBC WITH DIFFERENTIAL/PLATELET
Abs Immature Granulocytes: 0.02 10*3/uL (ref 0.00–0.07)
Basophils Absolute: 0 10*3/uL (ref 0.0–0.1)
Basophils Relative: 0 %
Eosinophils Absolute: 0.2 10*3/uL (ref 0.0–0.5)
Eosinophils Relative: 4 %
HCT: 31.6 % — ABNORMAL LOW (ref 36.0–46.0)
Hemoglobin: 9.6 g/dL — ABNORMAL LOW (ref 12.0–15.0)
Immature Granulocytes: 0 %
Lymphocytes Relative: 42 %
Lymphs Abs: 2.8 10*3/uL (ref 0.7–4.0)
MCH: 26.3 pg (ref 26.0–34.0)
MCHC: 30.4 g/dL (ref 30.0–36.0)
MCV: 86.6 fL (ref 80.0–100.0)
Monocytes Absolute: 0.6 10*3/uL (ref 0.1–1.0)
Monocytes Relative: 9 %
Neutro Abs: 2.9 10*3/uL (ref 1.7–7.7)
Neutrophils Relative %: 45 %
Platelets: 152 10*3/uL (ref 150–400)
RBC: 3.65 MIL/uL — ABNORMAL LOW (ref 3.87–5.11)
RDW: 14.3 % (ref 11.5–15.5)
WBC: 6.6 10*3/uL (ref 4.0–10.5)
nRBC: 0 % (ref 0.0–0.2)

## 2019-06-08 LAB — BASIC METABOLIC PANEL
Anion gap: 6 (ref 5–15)
BUN: 12 mg/dL (ref 8–23)
CO2: 23 mmol/L (ref 22–32)
Calcium: 9.2 mg/dL (ref 8.9–10.3)
Chloride: 109 mmol/L (ref 98–111)
Creatinine, Ser: 0.39 mg/dL — ABNORMAL LOW (ref 0.44–1.00)
GFR calc Af Amer: >60 mL/min (ref >60)
GFR calc non Af Amer: >60 mL/min (ref >60)
Glucose, Bld: 101 mg/dL — ABNORMAL HIGH (ref 70–99)
Potassium: 3.4 mmol/L — ABNORMAL LOW (ref 3.5–5.1)
Sodium: 138 mmol/L (ref 135–145)

## 2019-06-08 MED ORDER — LENALIDOMIDE 10 MG PO CAPS: 10.0000 mg | ORAL_CAPSULE | Freq: Every day | ORAL | 0 refills | Status: DC

## 2019-06-08 NOTE — Progress Notes (Signed)
[place] OFFICE PROGRESS NOTE  Patient Care Team: Center, [facility] as PCP - General ([place]) [name], [name]  Cancer Staging No matching staging information was found for the patient.   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 AnVicente Males# Acute renal failure [Dr.Singh; Proteinuria 1.5gm/day ]; acyclovir/Asprin ------------------------------------------------------------------------------------------------------------   DIAGNOSIS: '[ ]'  MULTIPLE MYELOMA  STAGE: III/HIGH RISK ;GOALS: CONTROL  CURRENT/MOST RECENT THERAPY-maintenance Revlimid    [place]A [place]oma [place] 11/07/2017 -  Chemotherapy   The patient had palonosetron (ALOXI) injection 0.25 mg, 0.25 mg, Intravenous,  Once, 6 of 7 cycles Administration: 0.25 mg (11/21/2017), 0.25 mg (11/29/2017), 0.25 mg (12/05/2017), 0.25 mg (12/21/2017), 0.25 mg (12/28/2017), 0.25 mg (01/04/2018), 0.25 mg (01/18/2018), 0.25 mg (01/25/2018), 0.25 mg (02/01/2018), 0.25 mg (02/15/2018), 0.25 mg (02/22/2018), 0.25 mg  (03/01/2018), 0.25 mg (03/15/2018), 0.25 mg (03/22/2018), 0.25 mg (03/30/2018), 0.25 mg (04/13/2018), 0.25 mg (04/20/2018), 0.25 mg (04/27/2018) cyclophosphamide (CYTOXAN) 500 mg in sodium chloride 0.9 % 250 mL chemo infusion, 540 mg, Intravenous,  Once, 6 of 7 cycles Administration: 500 mg (11/21/2017), 500 mg (11/29/2017), 500 mg (12/05/2017), 500 mg (12/28/2017), 500 mg (01/04/2018), 500 mg (01/18/2018), 500 mg (01/25/2018), 500 mg (02/01/2018), 500 mg (02/15/2018), 500 mg (02/22/2018), 500 mg (03/01/2018), 500 mg (03/15/2018), 500 mg (03/30/2018), 500 mg (04/13/2018), 500 mg (04/20/2018), 500 mg (04/27/2018) carfilzomib (KYPROLIS) 36 mg in dextrose 5 % 50 mL chemo infusion, 20 mg/m2 = 36 mg, Intravenous, Once, 6 of 7 cycles Administration: 36 mg (11/21/2017), 36 mg (11/22/2017), 60 mg (11/29/2017), 60 mg (11/30/2017), 60 mg (12/05/2017), 60 mg (12/06/2017), 60 mg (12/21/2017), 60 mg (12/22/2017), 60 mg (12/28/2017), 60 mg (12/29/2017), 60 mg (01/04/2018), 60 mg (01/05/2018), 60 mg (01/18/2018), 60 mg (01/19/2018), 60 mg (01/25/2018), 60 mg (01/27/2018), 60 mg (02/01/2018), 60 mg (02/02/2018), 60 mg (02/15/2018), 60 mg (02/16/2018), 60 mg (02/22/2018), 60 mg (03/01/2018), 60 mg (03/02/2018), 60 mg (03/15/2018), 60 mg (03/16/2018), 60 mg (03/23/2018), 60 mg (03/30/2018), 60 mg (03/31/2018), 60 mg (04/13/2018), 60 mg (04/14/2018), 60 mg (04/20/2018), 60 mg (04/21/2018), 60 mg (04/27/2018)  for chemotherapy treatment.       INTERVAL HISTORY:  VaEMMILEE [id number].o.  female pleasant patient above history of relapsed multiple myeloma-most recently on maintenance Revlimid.  However, Revlimid is on hold for the last 3 months-as the patient has been feeling poorly [difficulty swallowing/nausea vomiting significant weight loss].  Patient today feels improved the last few weeks.  Her appetite is improving.  Gaining weight.  Able to swallow well.   Continues to shortness of breath; unfortunately continues to smoke.  No worsening cough or hemoptysis.   Review of Systems   Constitutional: Positive for malaise/fatigue. Negative for chills, diaphoresis and fever.  HENT: Negative for nosebleeds and sore throat.   Eyes: Negative for double vision.  Respiratory: Negative for hemoptysis, sputum production and shortness of breath.   Cardiovascular: Negative for chest pain, palpitations and orthopnea.  Gastrointestinal: Negative for abdominal pain, blood in stool, constipation, heartburn and melena.  Genitourinary: Negative for dysuria, frequency and urgency.  Skin: Negative.  Negative for itching and rash.  Neurological: Negative for dizziness, tingling, focal weakness, weakness and headaches.  Endo/Heme/Allergies: Does not bruise/bleed easily.  Psychiatric/Behavioral: Negative for depression. The patient is not nervous/anxious and does not have insomnia.     PAST MEDICAL HISTORY :  Past Medical History:  Diagnosis Date  . Hypertension   . Multiple myeloma ([place])     PAST SURGICAL HISTORY :   Past Surgical History:  Procedure Laterality Date  . ESOPHAGOGASTRODUODENOSCOPY (EGD) WITH PROPOFOL N/A 03/30/2019   Procedure: ESOPHAGOGASTRODUODENOSCOPY (EGD) WITH PROPOFOL;  Surgeon: Jonathon Bellows, MD;  Location: [facility] ENDOSCOPY;  Service: Gastroenterology;  Laterality: N/A;  . IR FLUORO GUIDE PORT INSERTION RIGHT  12/16/2017    FAMILY HISTORY :   Family History  Problem Relation Age of Onset  . Pneumonia Mother   . Seizures Father     SOCIAL HISTORY:   Social History   Tobacco Use  . Smoking status: Current Some Day Smoker    Packs/day: 0.50    Types: Cigarettes  . Smokeless tobacco: Never Used  Substance Use Topics  . Alcohol use: No  . Drug use: No    ALLERGIES:  has No Known Allergies.  MEDICATIONS:  Current Outpatient Medications  Medication Sig Dispense Refill  . acyclovir (ZOVIRAX) 400 MG tablet TAKE 1 TABLET BY MOUTH ONCE DAILY TO  PREVENT  SHINGLES 30 tablet 0  . albuterol (VENTOLIN HFA) 108 (90 Base) MCG/ACT inhaler Inhale 2 puffs into the  lungs every 6 (six) hours as needed for wheezing or shortness of breath. 8 g 3  . amLODipine (NORVASC) 10 MG tablet Take 1 tablet by mouth once daily 90 tablet 2  . aspirin EC 325 MG tablet Take 81 mg by mouth daily.     . ergocalciferol (VITAMIN D2) 1.25 MG (50000 UT) capsule Take 1 capsule (50,000 Units total) by mouth once a week. 12 capsule 2  . feeding supplement, ENSURE ENLIVE, (ENSURE ENLIVE) LIQD Take 237 mLs by mouth 2 (two) times daily between meals. 60 Bottle 0  . lenalidomide (REVLIMID) 10 MG capsule Take 1 capsule (10 mg total) by mouth daily. Take for 21 days then hold for 7 days, Repeat every 28 days. 21 capsule 0  . ondansetron (ZOFRAN) 8 MG tablet One pill every 8 hours as needed for nausea/vomitting. 40 tablet 1  . pantoprazole (PROTONIX) 40 MG tablet Take 1 tablet (40 mg total) by mouth daily. 90 tablet 3  . polyethylene glycol (MIRALAX / GLYCOLAX) packet Take 17 g by mouth daily as needed for mild constipation. 14 each 0  . predniSONE (DELTASONE) 20 MG tablet Take 2 pills once a day x 14 days; and then 1 pill a a day. Take with food 40 tablet 0  . prochlorperazine (COMPAZINE) 10 MG tablet Take 1 tablet (10 mg total) by mouth every 6 (six) hours as needed for nausea or vomiting. 30 tablet 3  . benzonatate (TESSALON) 100 MG capsule Take 2 capsules by mouth 3 (three) times daily.    . chlorpheniramine-HYDROcodone (TUSSIONEX) 10-8 MG/5ML SUER Take 5 mLs by mouth every 12 (twelve) hours as needed for cough. (Patient not taking: Reported  on 06/07/2019) 140 mL 0  . nicotine (NICODERM CQ - DOSED IN MG/24 HOURS) 14 mg/24hr patch Place 1 patch (14 mg total) onto the skin daily. (Patient not taking: Reported on 06/07/2019) 28 patch 3  . potassium chloride SA (K-DUR) 20 MEQ tablet Take 1 tablet (20 mEq total) by mouth once for 1 dose. 30 tablet 6   No current facility-administered medications for this visit.    Facility-Administered Medications Ordered in Other Visits  Medication Dose  Route Frequency Provider Last Rate Last Dose  . sodium chloride flush (NS) 0.9 % injection 10 mL  10 mL Intracatheter PRN [name], MD   10 mL at 02/23/18 1400    PHYSICAL EXAMINATION: ECOG PERFORMANCE STATUS: 1 - Symptomatic but completely ambulatory  BP 138/63 (BP Location: Right Arm, Patient Position: Sitting, Cuff Size: Normal)   Pulse 89   Temp 97.6 F (36.4 C) (Tympanic)   Wt 130 lb 2 oz (59 kg)   BMI 24.59 kg/m   Filed Weights   06/07/19 1458  Weight: 130 lb 2 oz (59 kg)    Physical Exam  Constitutional: She is oriented to person, place, and time and well-developed, well-nourished, and in no distress.  Alone. She is walking herself.  HENT:  Head: Normocephalic and atraumatic.  Poor dentition.  Eyes: Pupils are equal, round, and reactive to light.  Neck: Normal range of motion. Neck supple.  Cardiovascular: Normal rate and regular rhythm.  Pulmonary/Chest: Effort normal and breath sounds normal. No respiratory distress. She has no wheezes.  Abdominal: Soft. Bowel sounds are normal. She exhibits no distension and no mass. There is no abdominal tenderness. There is no rebound and no guarding.  Musculoskeletal: Normal range of motion.        General: No tenderness.  Neurological: She is alert and oriented to person, place, and time.  Skin: Skin is warm.  Psychiatric: Affect normal.   LABORATORY DATA:  I have reviewed the data as listed    Component Value Date/Time   NA 138 06/08/2019 1024   K 3.4 (L) 06/08/2019 1024   CL 109 06/08/2019 1024   CO2 23 06/08/2019 1024   GLUCOSE 101 (H) 06/08/2019 1024   BUN 12 06/08/2019 1024   CREATININE 0.39 (L) 06/08/2019 1024   CALCIUM 9.2 06/08/2019 1024   PROT 6.8 05/25/2019 0924   ALBUMIN 3.5 05/25/2019 0924   AST 21 05/25/2019 0924   ALT 18 05/25/2019 0924   ALKPHOS 117 05/25/2019 0924   BILITOT 0.4 05/25/2019 0924   GFRNONAA >60 06/08/2019 1024   GFRAA >60 06/08/2019 1024    No results found for: SPEP,  UPEP  Lab Results  Component Value Date   WBC 6.6 06/08/2019   NEUTROABS 2.9 06/08/2019   HGB 9.6 (L) 06/08/2019   HCT 31.6 (L) 06/08/2019   MCV 86.6 06/08/2019   PLT 152 06/08/2019      Chemistry      Component Value Date/Time   NA 138 06/08/2019 1024   K 3.4 (L) 06/08/2019 1024   CL 109 06/08/2019 1024   CO2 23 06/08/2019 1024   BUN 12 06/08/2019 1024   CREATININE 0.39 (L) 06/08/2019 1024      Component Value Date/Time   CALCIUM 9.2 06/08/2019 1024   ALKPHOS 117 05/25/2019 0924   AST 21 05/25/2019 0924   ALT 18 05/25/2019 0924   BILITOT 0.4 05/25/2019 0924       RADIOGRAPHIC STUDIES: I have personally reviewed the radiological images as listed  and agreed with the findings in the report. No results found.   ASSESSMENT & PLAN:  IgA myeloma ([place]) #Multiple myeloma stage III high-risk cytogenetics-status post KRD-VG PR ; status post autologousstem cell transplant on 06/15/18. OCT 30th 2020 myeloma markers-absent myeloma protein/normal kappa lambda light chain ratio.  # Re-START Revlimid 10 mg; 3w-On & 1 w-OFF since clinically doing much better.  Recommend keeping appointment with BMT -[facility] next week.  # Dysphagia/ s/p EGD-dilatation- STABLE.   # Bone lesions/pain stable last zometa on 11/27/2017.  Holding Zometa secondary to poor dentition. Needs dental evaluation/clerance.   # Smoking- counseled to quit smoking-   # DISPOSITION: # Follow up with 4 weeks; MD- cbc/cmp-Dr.B  Orders Placed This Encounter  Procedures  . CBC with Differential    Standing Status:   Future    Standing Expiration Date:   06/07/2020  . Comprehensive metabolic panel    Standing Status:   Future    Standing Expiration Date:   06/07/2020   All questions were answered. The patient knows to call the clinic with any problems, questions or concerns.      [name], MD 06/08/2019 1:02 PM

## 2019-06-08 NOTE — Assessment & Plan Note (Addendum)
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 OCT 30th 2020 myeloma markers-absent myeloma protein/normal kappa lambda light chain ratio.  # Re-START Revlimid 10 mg; 3w-On & 1 w-OFF since clinically doing much better.  Recommend keeping appointment with BMT -[facility] next week. Hb 9.6; renal function LFTs-normal.  # Dysphagia/ s/p EGD-dilatation- STABLE.   # Bone lesions/pain stable last zometa on 11/27/2017.  Holding Zometa secondary to poor dentition. Needs dental evaluation/clerance.   # Smoking- counseled to quit smoking-   # DISPOSITION: # Follow up with 4 weeks; MD- cbc/cmp-Dr.B

## 2019-06-08 NOTE — Patient Instructions (Signed)
#  Recommend dental evaluation regarding the need for zometa injections for your multiple myeloma

## 2019-07-02 ENCOUNTER — Other Ambulatory Visit: Payer: Self-pay | Admitting: Licensed Clinical Social Worker

## 2019-07-02 DIAGNOSIS — C9002 Multiple myeloma in relapse: Secondary | ICD-10-CM

## 2019-07-02 MED ORDER — LENALIDOMIDE 10 MG PO CAPS: 10.0000 mg | ORAL_CAPSULE | Freq: Every day | ORAL | 0 refills | Status: DC

## 2019-07-05 ENCOUNTER — Other Ambulatory Visit: Payer: Self-pay

## 2019-07-06 ENCOUNTER — Inpatient Hospital Stay: Payer: Medicare Other | Attending: Internal Medicine

## 2019-07-06 ENCOUNTER — Inpatient Hospital Stay (HOSPITAL_BASED_OUTPATIENT_CLINIC_OR_DEPARTMENT_OTHER): Payer: Medicare Other | Admitting: Internal Medicine

## 2019-07-06 ENCOUNTER — Other Ambulatory Visit: Payer: Self-pay

## 2019-07-06 ENCOUNTER — Encounter: Payer: Self-pay | Admitting: Licensed Clinical Social Worker

## 2019-07-06 DIAGNOSIS — Z9484 Stem cells transplant status: Secondary | ICD-10-CM | POA: Insufficient documentation

## 2019-07-06 DIAGNOSIS — I10 Essential (primary) hypertension: Secondary | ICD-10-CM | POA: Diagnosis not present

## 2019-07-06 DIAGNOSIS — C9000 Multiple myeloma not having achieved remission: Secondary | ICD-10-CM

## 2019-07-06 DIAGNOSIS — C9002 Multiple myeloma in relapse: Secondary | ICD-10-CM

## 2019-07-06 DIAGNOSIS — Z79899 Other long term (current) drug therapy: Secondary | ICD-10-CM | POA: Diagnosis not present

## 2019-07-06 DIAGNOSIS — F17210 Nicotine dependence, cigarettes, uncomplicated: Secondary | ICD-10-CM | POA: Insufficient documentation

## 2019-07-06 DIAGNOSIS — R1310 Dysphagia, unspecified: Secondary | ICD-10-CM | POA: Diagnosis not present

## 2019-07-06 DIAGNOSIS — R110 Nausea: Secondary | ICD-10-CM | POA: Insufficient documentation

## 2019-07-06 LAB — CBC WITH DIFFERENTIAL/PLATELET
Abs Immature Granulocytes: 0.02 10*3/uL (ref 0.00–0.07)
Basophils Absolute: 0 10*3/uL (ref 0.0–0.1)
Basophils Relative: 1 %
Eosinophils Absolute: 0.9 10*3/uL — ABNORMAL HIGH (ref 0.0–0.5)
Eosinophils Relative: 13 %
HCT: 32.1 % — ABNORMAL LOW (ref 36.0–46.0)
Hemoglobin: 9.5 g/dL — ABNORMAL LOW (ref 12.0–15.0)
Immature Granulocytes: 0 %
Lymphocytes Relative: 44 %
Lymphs Abs: 3 10*3/uL (ref 0.7–4.0)
MCH: 25.5 pg — ABNORMAL LOW (ref 26.0–34.0)
MCHC: 29.6 g/dL — ABNORMAL LOW (ref 30.0–36.0)
MCV: 86.1 fL (ref 80.0–100.0)
Monocytes Absolute: 0.5 10*3/uL (ref 0.1–1.0)
Monocytes Relative: 8 %
Neutro Abs: 2.3 10*3/uL (ref 1.7–7.7)
Neutrophils Relative %: 34 %
Platelets: 208 10*3/uL (ref 150–400)
RBC: 3.73 MIL/uL — ABNORMAL LOW (ref 3.87–5.11)
RDW: 14.5 % (ref 11.5–15.5)
WBC: 6.7 10*3/uL (ref 4.0–10.5)
nRBC: 0 % (ref 0.0–0.2)

## 2019-07-06 LAB — COMPREHENSIVE METABOLIC PANEL
ALT: 32 U/L (ref 0–44)
AST: 32 U/L (ref 15–41)
Albumin: 3.3 g/dL — ABNORMAL LOW (ref 3.5–5.0)
Alkaline Phosphatase: 150 U/L — ABNORMAL HIGH (ref 38–126)
Anion gap: 9 (ref 5–15)
BUN: 11 mg/dL (ref 8–23)
CO2: 23 mmol/L (ref 22–32)
Calcium: 9.1 mg/dL (ref 8.9–10.3)
Chloride: 110 mmol/L (ref 98–111)
Creatinine, Ser: 0.39 mg/dL — ABNORMAL LOW (ref 0.44–1.00)
GFR calc Af Amer: >60 mL/min (ref >60)
GFR calc non Af Amer: >60 mL/min (ref >60)
Glucose, Bld: 105 mg/dL — ABNORMAL HIGH (ref 70–99)
Potassium: 3.7 mmol/L (ref 3.5–5.1)
Sodium: 142 mmol/L (ref 135–145)
Total Bilirubin: 0.7 mg/dL (ref 0.3–1.2)
Total Protein: 6.4 g/dL — ABNORMAL LOW (ref 6.5–8.1)

## 2019-07-06 MED ORDER — NICOTINE 21 MG/24HR TD PT24: 21.0000 mg | MEDICATED_PATCH | Freq: Every day | TRANSDERMAL | 0 refills | Status: DC

## 2019-07-06 NOTE — Assessment & Plan Note (Addendum)
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 OCT 30th 2020 myeloma markers-absent myeloma protein/normal kappa lambda light chain ratio.  # Re-START Revlimid 10 mg; 3w-On & 1 w-OFF since clinically doing much better.s/p BMT -[facility] next week. Hb 9.6; renal function LFTs-normal.  # awaiting BMbx at [place]- to rule out Amyloidosis [given weight loss]  # Dysphagia/ s/p EGD-dilatation- STABLE; not completely resolved.  See below.   # Weight loss at baseline 150s; now 129- unable to gain weight [good apetite]; will discuss with GI re: 09/04/2020EGD- will check re: Bx for amyoidsosi; Toni Arthurs re: Bx for amyloidosis. Will discuss with Dr.Anna.   # Bone lesions/pain stable last zometa on 11/27/2017.  Holding Zometa secondary to poor dentition. Needs dental evaluation/clerance.   # Smoking- counseled to quit smoking- 3 cig/day; start nicotine patch.   # DISPOSITION: # Follow up with 4 weeks; MD- cbc/cmp-Dr.B

## 2019-07-06 NOTE — Progress Notes (Signed)
[facility] OFFICE PROGRESS NOTE  Patient Care Team: Center, [facility] as PCP - General ([place]) [name], [name]  Cancer Staging No matching staging information was found for the patient.   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   # 9/14- velcade SQ twice weekly/Dex 40 mg/week; OCT 5th 2018-Start R [29m]VD; 3cycles of RVD- PARTAL RESPONSE  # Jan 11th 2019-Dara-Rev-Dex; April 2019- BMBx- plasma cell -by CD-138/IHC-80% [baseline Sep 2018- 85% ]; HOLD transplant [dw Dr.Gasperatto]  # April 29th 2019 2019- carfil-Cyt-Dex; AUG 6th BMBx- 6% plasma cells; VGPR  # Autologous stem cell transplant on 06/15/18 [[facility]/ Dr.Gasperrato]  # may 1st week-2019- Maintenance Revlimid 10 mg 3w/1w  # 12/12- RIGHT JUGULAR DVT-x 375mn xarelto; finished April 2020; September 2020-EGD/dysphagia; Dr. AnVicente Males# Acute renal failure [Dr.Singh; Proteinuria 1.5gm/day ]; acyclovir/Asprin ------------------------------------------------------------------------------------------------------------   DIAGNOSIS: [ ] MULTIPLE MYELOMA  STAGE: III/HIGH RISK ;GOALS: CONTROL  CURRENT/MOST RECENT THERAPY-maintenance Revlimid    IgA myeloma ([facility] 11/07/2017 -  Chemotherapy   The patient had palonosetron (ALOXI) injection 0.25 mg, 0.25 mg, Intravenous,  Once, 6 of 7 cycles Administration: 0.25 mg (11/21/2017), 0.25 mg (11/29/2017), 0.25 mg (12/05/2017), 0.25 mg (12/21/2017), 0.25 mg (12/28/2017), 0.25 mg (01/04/2018), 0.25 mg (01/18/2018), 0.25 mg (01/25/2018), 0.25 mg (02/01/2018), 0.25 mg (02/15/2018), 0.25 mg (02/22/2018), 0.25 mg  (03/01/2018), 0.25 mg (03/15/2018), 0.25 mg (03/22/2018), 0.25 mg (03/30/2018), 0.25 mg (04/13/2018), 0.25 mg (04/20/2018), 0.25 mg (04/27/2018) cyclophosphamide (CYTOXAN) 500 mg in sodium chloride 0.9 % 250 mL chemo infusion, 540 mg, Intravenous,  Once, 6 of 7 cycles Administration: 500 mg (11/21/2017), 500 mg (11/29/2017), 500 mg (12/05/2017), 500 mg (12/28/2017), 500 mg (01/04/2018), 500 mg (01/18/2018), 500 mg (01/25/2018), 500 mg (02/01/2018), 500 mg (02/15/2018), 500 mg (02/22/2018), 500 mg (03/01/2018), 500 mg (03/15/2018), 500 mg (03/30/2018), 500 mg (04/13/2018), 500 mg (04/20/2018), 500 mg (04/27/2018) carfilzomib (KYPROLIS) 36 mg in dextrose 5 % 50 mL chemo infusion, 20 mg/m2 = 36 mg, Intravenous, Once, 6 of 7 cycles Administration: 36 mg (11/21/2017), 36 mg (11/22/2017), 60 mg (11/29/2017), 60 mg (11/30/2017), 60 mg (12/05/2017), 60 mg (12/06/2017), 60 mg (12/21/2017), 60 mg (12/22/2017), 60 mg (12/28/2017), 60 mg (12/29/2017), 60 mg (01/04/2018), 60 mg (01/05/2018), 60 mg (01/18/2018), 60 mg (01/19/2018), 60 mg (01/25/2018), 60 mg (01/27/2018), 60 mg (02/01/2018), 60 mg (02/02/2018), 60 mg (02/15/2018), 60 mg (02/16/2018), 60 mg (02/22/2018), 60 mg (03/01/2018), 60 mg (03/02/2018), 60 mg (03/15/2018), 60 mg (03/16/2018), 60 mg (03/23/2018), 60 mg (03/30/2018), 60 mg (03/31/2018), 60 mg (04/13/2018), 60 mg (04/14/2018), 60 mg (04/20/2018), 60 mg (04/21/2018), 60 mg (04/27/2018)  for chemotherapy treatment.       INTERVAL HISTORY:  VaKIERSTAN AUER89.o.  female pleasant patient above history of relapsed multiple myeloma--currently on maintenance Revlimid.   [facility].  Patient received her vaccinations.  However given ongoing difficulty with weight gain recommended-bone marrow biopsy/GI evaluation for amyloidosis.  Awaiting bone marrow biopsy.  She continues to smoke however 3 cigarettes a day.   Intermittent nausea.  Intermittent difficulty swallowing.  Overall  improved though.  Her baseline weight is 150; she is currently 129.  Slightly improved but not back to  baseline weight.  Denies any diarrhea.  Review of Systems  Constitutional: Positive for malaise/fatigue. Negative for chills, diaphoresis and fever.  HENT: Negative for nosebleeds and sore throat.   Eyes: Negative for double vision.  Respiratory: Negative for hemoptysis, sputum production and shortness of breath.   Cardiovascular: Negative for chest pain, palpitations and orthopnea.  Gastrointestinal: Negative for abdominal pain, blood in stool, constipation, heartburn and melena.  Genitourinary: Negative for dysuria, frequency and urgency.  Skin: Negative.  Negative for itching and rash.  Neurological: Negative for dizziness, tingling, focal weakness, weakness and headaches.  Endo/Heme/Allergies: Does not bruise/bleed easily.  Psychiatric/Behavioral: Negative for depression. The patient is not nervous/anxious and does not have insomnia.     PAST MEDICAL HISTORY :  Past Medical History:  Diagnosis Date  . Hypertension   . Multiple myeloma (HCC)     PAST SURGICAL HISTORY :   Past Surgical History:  Procedure Laterality Date  . ESOPHAGOGASTRODUODENOSCOPY (EGD) WITH PROPOFOL N/A 03/30/2019   Procedure: ESOPHAGOGASTRODUODENOSCOPY (EGD) WITH PROPOFOL;  Surgeon: Anna, Kiran, MD;  Location: [facility] ENDOSCOPY;  Service: Gastroenterology;  Laterality: N/A;  . IR FLUORO GUIDE PORT INSERTION RIGHT  12/16/2017    FAMILY HISTORY :   Family History  Problem Relation Age of Onset  . Pneumonia Mother   . Seizures Father     SOCIAL HISTORY:   Social History   Tobacco Use  . Smoking status: Current Some Day Smoker    Packs/day: 0.50    Types: Cigarettes  . Smokeless tobacco: Never Used  Substance Use Topics  . Alcohol use: No  . Drug use: No    ALLERGIES:  has No Known Allergies.  MEDICATIONS:  Current Outpatient Medications  Medication Sig Dispense Refill  . acyclovir (ZOVIRAX) 400  MG tablet TAKE 1 TABLET BY MOUTH ONCE DAILY TO  PREVENT  SHINGLES 30 tablet 0  . albuterol (VENTOLIN HFA) 108 (90 Base) MCG/ACT inhaler Inhale 2 puffs into the lungs every 6 (six) hours as needed for wheezing or shortness of breath. 8 g 3  . amLODipine (NORVASC) 10 MG tablet Take 1 tablet by mouth once daily 90 tablet 2  . aspirin EC 325 MG tablet Take 81 mg by mouth daily.     . benzonatate (TESSALON) 100 MG capsule Take 2 capsules by mouth 3 (three) times daily.    . chlorpheniramine-HYDROcodone (TUSSIONEX) 10-8 MG/5ML SUER Take 5 mLs by mouth every 12 (twelve) hours as needed for cough. 140 mL 0  . ergocalciferol (VITAMIN D2) 1.25 MG (50000 UT) capsule Take 1 capsule (50,000 Units total) by mouth once a week. 12 capsule 2  . feeding supplement, ENSURE ENLIVE, (ENSURE ENLIVE) LIQD Take 237 mLs by mouth 2 (two) times daily between meals. 60 Bottle 0  . lenalidomide (REVLIMID) 10 MG capsule Take 1 capsule (10 mg total) by mouth daily. Take for 21 days then hold for 7 days, Repeat every 28 days. 21 capsule 0  . nicotine (NICODERM CQ - DOSED IN MG/24 HOURS) 14 mg/24hr patch Place 1 patch (14 mg total) onto the skin daily. 28 patch 3  . ondansetron (ZOFRAN) 8 MG tablet One pill every 8 hours as needed for nausea/vomitting. 40 tablet 1  . pantoprazole (PROTONIX) 40 MG tablet Take 1 tablet (40 mg total) by mouth daily. 90 tablet 3  . polyethylene glycol (MIRALAX / GLYCOLAX) packet Take 17 g by mouth daily as needed for mild constipation. 14 each 0  . predniSONE (DELTASONE) 20 MG tablet Take 2 pills   once a day x 14 days; and then 1 pill a a day. Take with food 40 tablet 0  . prochlorperazine (COMPAZINE) 10 MG tablet Take 1 tablet (10 mg total) by mouth every 6 (six) hours as needed for nausea or vomiting. 30 tablet 3  . nicotine (NICODERM CQ - DOSED IN MG/24 HOURS) 21 mg/24hr patch Place 1 patch (21 mg total) onto the skin daily. 28 patch 0  . potassium chloride SA (K-DUR) 20 MEQ tablet Take 1 tablet (20  mEq total) by mouth once for 1 dose. 30 tablet 6   No current facility-administered medications for this visit.   Facility-Administered Medications Ordered in Other Visits  Medication Dose Route Frequency Provider Last Rate Last Admin  . sodium chloride flush (NS) 0.9 % injection 10 mL  10 mL Intracatheter PRN [name], [name], MD   10 mL at 02/23/18 1400    PHYSICAL EXAMINATION: ECOG PERFORMANCE STATUS: 1 - Symptomatic but completely ambulatory  BP 111/73 (BP Location: Right Arm, Patient Position: Sitting)   Pulse 78   Temp 97.8 F (36.6 C) (Tympanic)   Resp 16   Wt 129 lb 3.2 oz (58.6 kg)   SpO2 94%   BMI 24.41 kg/m   Filed Weights   07/06/19 1046  Weight: 129 lb 3.2 oz (58.6 kg)    Physical Exam  Constitutional: She is oriented to person, place, and time and well-developed, well-nourished, and in no distress.  Alone. She is walking herself.  HENT:  Head: Normocephalic and atraumatic.  Poor dentition.  Eyes: Pupils are equal, round, and reactive to light.  Cardiovascular: Normal rate and regular rhythm.  Pulmonary/Chest: Effort normal and breath sounds normal. No respiratory distress. She has no wheezes.  Abdominal: Soft. Bowel sounds are normal. She exhibits no distension and no mass. There is no abdominal tenderness. There is no rebound and no guarding.  Musculoskeletal:        General: No tenderness. Normal range of motion.     Cervical back: Normal range of motion and neck supple.  Neurological: She is alert and oriented to person, place, and time.  Skin: Skin is warm.  Psychiatric: Affect normal.   LABORATORY DATA:  I have reviewed the data as listed    Component Value Date/Time   NA 142 07/06/2019 1011   K 3.7 07/06/2019 1011   CL 110 07/06/2019 1011   CO2 23 07/06/2019 1011   GLUCOSE 105 (H) 07/06/2019 1011   BUN 11 07/06/2019 1011   CREATININE 0.39 (L) 07/06/2019 1011   CALCIUM 9.1 07/06/2019 1011   PROT 6.4 (L) 07/06/2019 1011   ALBUMIN 3.3 (L)  07/06/2019 1011   AST 32 07/06/2019 1011   ALT 32 07/06/2019 1011   ALKPHOS 150 (H) 07/06/2019 1011   BILITOT 0.7 07/06/2019 1011   GFRNONAA >60 07/06/2019 1011   GFRAA >60 07/06/2019 1011    No results found for: SPEP, UPEP  Lab Results  Component Value Date   WBC 6.7 07/06/2019   NEUTROABS 2.3 07/06/2019   HGB 9.5 (L) 07/06/2019   HCT 32.1 (L) 07/06/2019   MCV 86.1 07/06/2019   PLT 208 07/06/2019      Chemistry      Component Value Date/Time   NA 142 07/06/2019 1011   K 3.7 07/06/2019 1011   CL 110 07/06/2019 1011   CO2 23 07/06/2019 1011   BUN 11 07/06/2019 1011   CREATININE 0.39 (L) 07/06/2019 1011      Component Value Date/Time     CALCIUM 9.1 07/06/2019 1011   ALKPHOS 150 (H) 07/06/2019 1011   AST 32 07/06/2019 1011   ALT 32 07/06/2019 1011   BILITOT 0.7 07/06/2019 1011       RADIOGRAPHIC STUDIES: I have personally reviewed the radiological images as listed and agreed with the findings in the report. No results found.   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 OCT 30th 2020 myeloma markers-absent myeloma protein/normal kappa lambda light chain ratio.  # Re-START Revlimid 10 mg; 3w-On & 1 w-OFF since clinically doing much better.s/p BMT -[facility] next week. Hb 9.6; renal function LFTs-normal.  # awaiting BMbx at [place]- to rule out Amyloidosis [given weight loss]  # Dysphagia/ s/p EGD-dilatation- STABLE; not completely resolved.  See below.   # Weight loss at baseline 150s; now 129- unable to gain weight [good apetite]; will discuss with GI re: 09/04/2020EGD- will check re: Bx for amyoidsosi; Toni Arthurs re: Bx for amyloidosis. Will discuss with Dr.Anna.   # Bone lesions/pain stable last zometa on 11/27/2017.  Holding Zometa secondary to poor dentition. Needs dental evaluation/clerance.   # Smoking- counseled to quit smoking- 3 cig/day;  start nicotine patch.   # DISPOSITION: # Follow up with 4 weeks; MD- cbc/cmp-Dr.B  No orders of the defined types were placed in this encounter.  All questions were answered. The patient knows to call the clinic with any problems, questions or concerns.      [name], MD 07/06/2019 11:16 AM

## 2019-08-02 NOTE — Progress Notes (Deleted)
Left vm - requesting patient to return our office phone call re: her apts tom. Attempted to reach patient for covid screening questions.

## 2019-08-03 ENCOUNTER — Inpatient Hospital Stay: Payer: Medicare Other | Attending: Internal Medicine

## 2019-08-03 ENCOUNTER — Inpatient Hospital Stay: Payer: Medicare Other | Admitting: Internal Medicine

## 2019-08-03 ENCOUNTER — Telehealth: Payer: Self-pay | Admitting: *Deleted

## 2019-08-03 DIAGNOSIS — N179 Acute kidney failure, unspecified: Secondary | ICD-10-CM | POA: Insufficient documentation

## 2019-08-03 DIAGNOSIS — I10 Essential (primary) hypertension: Secondary | ICD-10-CM | POA: Insufficient documentation

## 2019-08-03 DIAGNOSIS — Z9484 Stem cells transplant status: Secondary | ICD-10-CM | POA: Insufficient documentation

## 2019-08-03 DIAGNOSIS — Z7982 Long term (current) use of aspirin: Secondary | ICD-10-CM | POA: Insufficient documentation

## 2019-08-03 DIAGNOSIS — R634 Abnormal weight loss: Secondary | ICD-10-CM | POA: Insufficient documentation

## 2019-08-03 DIAGNOSIS — E876 Hypokalemia: Secondary | ICD-10-CM | POA: Insufficient documentation

## 2019-08-03 DIAGNOSIS — R600 Localized edema: Secondary | ICD-10-CM | POA: Insufficient documentation

## 2019-08-03 DIAGNOSIS — F17210 Nicotine dependence, cigarettes, uncomplicated: Secondary | ICD-10-CM | POA: Insufficient documentation

## 2019-08-03 DIAGNOSIS — C9000 Multiple myeloma not having achieved remission: Secondary | ICD-10-CM | POA: Insufficient documentation

## 2019-08-03 DIAGNOSIS — Z79899 Other long term (current) drug therapy: Secondary | ICD-10-CM | POA: Insufficient documentation

## 2019-08-03 NOTE — Progress Notes (Deleted)
[facility] OFFICE PROGRESS NOTE  Patient Care Team: [facility], [facility] as PCP - General (General Practice) Gasparetto, Cristina, [name]  Cancer Staging No matching staging information was found for the patient.   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 Anna  # Acute renal failure [Dr.Singh; Proteinuria 1.5gm/day ]; acyclovir/Asprin ------------------------------------------------------------------------------------------------------------   DIAGNOSIS: [ ] MULTIPLE MYELOMA  STAGE: III/HIGH RISK ;GOALS: CONTROL  CURRENT/MOST RECENT THERAPY-maintenance Revlimid    IgA myeloma (HCC)  11/07/2017 -  Chemotherapy   The patient had palonosetron (ALOXI) injection 0.25 mg, 0.25 mg, Intravenous,  Once, 6 of 7 cycles Administration: 0.25 mg (11/21/2017), 0.25 mg (11/29/2017), 0.25 mg (12/05/2017), 0.25 mg (12/21/2017), 0.25 mg (12/28/2017), 0.25 mg (01/04/2018), 0.25 mg (01/18/2018), 0.25 mg (01/25/2018), 0.25 mg (02/01/2018), 0.25 mg (02/15/2018), 0.25 mg (02/22/2018), 0.25 mg  (03/01/2018), 0.25 mg (03/15/2018), 0.25 mg (03/22/2018), 0.25 mg (03/30/2018), 0.25 mg (04/13/2018), 0.25 mg (04/20/2018), 0.25 mg (04/27/2018) cyclophosphamide (CYTOXAN) 500 mg in sodium chloride 0.9 % 250 mL chemo infusion, 540 mg, Intravenous,  Once, 6 of 7 cycles Administration: 500 mg (11/21/2017), 500 mg (11/29/2017), 500 mg (12/05/2017), 500 mg (12/28/2017), 500 mg (01/04/2018), 500 mg (01/18/2018), 500 mg (01/25/2018), 500 mg (02/01/2018), 500 mg (02/15/2018), 500 mg (02/22/2018), 500 mg (03/01/2018), 500 mg (03/15/2018), 500 mg (03/30/2018), 500 mg (04/13/2018), 500 mg (04/20/2018), 500 mg (04/27/2018) carfilzomib (KYPROLIS) 36 mg in dextrose 5 % 50 mL chemo infusion, 20 mg/m2 = 36 mg, Intravenous, Once, 6 of 7 cycles Administration: 36 mg (11/21/2017), 36 mg (11/22/2017), 60 mg (11/29/2017), 60 mg (11/30/2017), 60 mg (12/05/2017), 60 mg (12/06/2017), 60 mg (12/21/2017), 60 mg (12/22/2017), 60 mg (12/28/2017), 60 mg (12/29/2017), 60 mg (01/04/2018), 60 mg (01/05/2018), 60 mg (01/18/2018), 60 mg (01/19/2018), 60 mg (01/25/2018), 60 mg (01/27/2018), 60 mg (02/01/2018), 60 mg (02/02/2018), 60 mg (02/15/2018), 60 mg (02/16/2018), 60 mg (02/22/2018), 60 mg (03/01/2018), 60 mg (03/02/2018), 60 mg (03/15/2018), 60 mg (03/16/2018), 60 mg (03/23/2018), 60 mg (03/30/2018), 60 mg (03/31/2018), 60 mg (04/13/2018), 60 mg (04/14/2018), 60 mg (04/20/2018), 60 mg (04/21/2018), 60 mg (04/27/2018)  for chemotherapy treatment.       INTERVAL HISTORY:  Brandi [name] 68 y.o.  female pleasant patient above history of relapsed multiple myeloma--currently on maintenance Revlimid.   Patient was recently evaluated at [facility] for a follow-up of 12-month port bone marrow transplant.  Patient received her vaccinations.  However given ongoing difficulty with weight gain recommended-bone marrow biopsy/GI evaluation for amyloidosis.  Awaiting bone marrow biopsy.  She continues to smoke however 3 cigarettes a day.   Intermittent nausea.  Intermittent difficulty swallowing.  Overall  improved though.  Her baseline weight is 150; she is currently 129.  Slightly improved but not back to   baseline weight.  Denies any diarrhea.  Review of Systems  Constitutional: Positive for malaise/fatigue. Negative for chills, diaphoresis and fever.  HENT: Negative for nosebleeds and sore throat.   Eyes: Negative for double vision.  Respiratory: Negative for hemoptysis, sputum production and shortness of breath.   Cardiovascular: Negative for chest pain, palpitations and orthopnea.  Gastrointestinal: Negative for abdominal pain, blood in stool, constipation, heartburn and melena.  Genitourinary: Negative for dysuria, frequency and urgency.  Skin: Negative.  Negative for itching and rash.  Neurological: Negative for dizziness, tingling, focal weakness, weakness and headaches.  Endo/Heme/Allergies: Does not bruise/bleed easily.  Psychiatric/Behavioral: Negative for depression. The patient is not nervous/anxious and does not have insomnia.     PAST MEDICAL HISTORY :  Past Medical History:  Diagnosis Date  . Hypertension   . Multiple myeloma ([place])     PAST SURGICAL HISTORY :   Past Surgical History:  Procedure Laterality Date  . ESOPHAGOGASTRODUODENOSCOPY (EGD) WITH PROPOFOL N/A 03/30/2019   Procedure: ESOPHAGOGASTRODUODENOSCOPY (EGD) WITH PROPOFOL;  Surgeon: Jonathon Bellows, MD;  Location: [facility] ENDOSCOPY;  Service: Gastroenterology;  Laterality: N/A;  . IR FLUORO GUIDE PORT INSERTION RIGHT  12/16/2017    FAMILY HISTORY :   Family History  Problem Relation Age of Onset  . Pneumonia Mother   . Seizures Father     SOCIAL HISTORY:   Social History   Tobacco Use  . Smoking status: Current Some Day Smoker    Packs/day: 0.50    Types: Cigarettes  . Smokeless tobacco: Never Used  Substance Use Topics  . Alcohol use: No  . Drug use: No    ALLERGIES:  has No Known Allergies.  MEDICATIONS:  Current Outpatient Medications  Medication Sig Dispense Refill  . acyclovir (ZOVIRAX) 400  MG tablet TAKE 1 TABLET BY MOUTH ONCE DAILY TO  PREVENT  SHINGLES 30 tablet 0  . albuterol (VENTOLIN HFA) 108 (90 Base) MCG/ACT inhaler Inhale 2 puffs into the lungs every 6 (six) hours as needed for wheezing or shortness of breath. 8 g 3  . amLODipine (NORVASC) 10 MG tablet Take 1 tablet by mouth once daily 90 tablet 2  . aspirin EC 325 MG tablet Take 81 mg by mouth daily.     . benzonatate (TESSALON) 100 MG capsule Take 2 capsules by mouth 3 (three) times daily.    . chlorpheniramine-HYDROcodone (TUSSIONEX) 10-8 MG/5ML SUER Take 5 mLs by mouth every 12 (twelve) hours as needed for cough. 140 mL 0  . ergocalciferol (VITAMIN D2) 1.25 MG (50000 UT) capsule Take 1 capsule (50,000 Units total) by mouth once a week. 12 capsule 2  . feeding supplement, ENSURE ENLIVE, (ENSURE ENLIVE) LIQD Take 237 mLs by mouth 2 (two) times daily between meals. 60 Bottle 0  . lenalidomide (REVLIMID) 10 MG capsule Take 1 capsule (10 mg total) by mouth daily. Take for 21 days then hold for 7 days, Repeat every 28 days. 21 capsule 0  . nicotine (NICODERM CQ - DOSED IN MG/24 HOURS) 14 mg/24hr patch Place 1 patch (14 mg total) onto the skin daily. 28 patch 3  . nicotine (NICODERM CQ - DOSED IN MG/24 HOURS) 21 mg/24hr patch Place 1 patch (21 mg total) onto the skin daily. 28 patch 0  . ondansetron (ZOFRAN) 8 MG tablet One pill every 8 hours as needed for nausea/vomitting. 40 tablet 1  . pantoprazole (PROTONIX) 40 MG tablet Take 1 tablet (40 mg total) by mouth daily. 90 tablet 3  . polyethylene glycol (MIRALAX /  GLYCOLAX) packet Take 17 g by mouth daily as needed for mild constipation. 14 each 0  . potassium chloride SA (K-DUR) 20 MEQ tablet Take 1 tablet (20 mEq total) by mouth once for 1 dose. 30 tablet 6  . predniSONE (DELTASONE) 20 MG tablet Take 2 pills once a day x 14 days; and then 1 pill a a day. Take with food 40 tablet 0  . prochlorperazine (COMPAZINE) 10 MG tablet Take 1 tablet (10 mg total) by mouth every 6 (six)  hours as needed for nausea or vomiting. 30 tablet 3   No current facility-administered medications for this visit.   Facility-Administered Medications Ordered in Other Visits  Medication Dose Route Frequency Provider Last Rate Last Admin  . sodium chloride flush (NS) 0.9 % injection 10 mL  10 mL Intracatheter PRN [name], MD   10 mL at 02/23/18 1400    PHYSICAL EXAMINATION: ECOG PERFORMANCE STATUS: 1 - Symptomatic but completely ambulatory  There were no vitals taken for this visit.  There were no vitals filed for this visit.  Physical Exam  Constitutional: She is oriented to person, place, and time and well-developed, well-nourished, and in no distress.  Alone. She is walking herself.  HENT:  Head: Normocephalic and atraumatic.  Poor dentition.  Eyes: Pupils are equal, round, and reactive to light.  Cardiovascular: Normal rate and regular rhythm.  Pulmonary/Chest: Effort normal and breath sounds normal. No respiratory distress. She has no wheezes.  Abdominal: Soft. Bowel sounds are normal. She exhibits no distension and no mass. There is no abdominal tenderness. There is no rebound and no guarding.  Musculoskeletal:        General: No tenderness. Normal range of motion.     Cervical back: Normal range of motion and neck supple.  Neurological: She is alert and oriented to person, place, and time.  Skin: Skin is warm.  Psychiatric: Affect normal.   LABORATORY DATA:  I have reviewed the data as listed    Component Value Date/Time   NA 142 07/06/2019 1011   K 3.7 07/06/2019 1011   CL 110 07/06/2019 1011   CO2 23 07/06/2019 1011   GLUCOSE 105 (H) 07/06/2019 1011   BUN 11 07/06/2019 1011   CREATININE 0.39 (L) 07/06/2019 1011   CALCIUM 9.1 07/06/2019 1011   PROT 6.4 (L) 07/06/2019 1011   ALBUMIN 3.3 (L) 07/06/2019 1011   AST 32 07/06/2019 1011   ALT 32 07/06/2019 1011   ALKPHOS 150 (H) 07/06/2019 1011   BILITOT 0.7 07/06/2019 1011   GFRNONAA >60 07/06/2019  1011   GFRAA >60 07/06/2019 1011    No results found for: SPEP, UPEP  Lab Results  Component Value Date   WBC 6.7 07/06/2019   NEUTROABS 2.3 07/06/2019   HGB 9.5 (L) 07/06/2019   HCT 32.1 (L) 07/06/2019   MCV 86.1 07/06/2019   PLT 208 07/06/2019      Chemistry      Component Value Date/Time   NA 142 07/06/2019 1011   K 3.7 07/06/2019 1011   CL 110 07/06/2019 1011   CO2 23 07/06/2019 1011   BUN 11 07/06/2019 1011   CREATININE 0.39 (L) 07/06/2019 1011      Component Value Date/Time   CALCIUM 9.1 07/06/2019 1011   ALKPHOS 150 (H) 07/06/2019 1011   AST 32 07/06/2019 1011   ALT 32 07/06/2019 1011   BILITOT 0.7 07/06/2019 1011       RADIOGRAPHIC STUDIES: I have personally reviewed the radiological  images as listed and agreed with the findings in the report. No results found.   ASSESSMENT & PLAN:  No problem-specific Assessment & Plan notes found for this encounter.  No orders of the defined types were placed in this encounter.  All questions were answered. The patient knows to call the clinic with any problems, questions or concerns.      [name], MD 08/03/2019 8:25 AM

## 2019-08-03 NOTE — Telephone Encounter (Signed)
Contacted patient. Left vm re: no show apt today. I reached out to patient's daughter Varney Biles to give msg to pt to call our office to r/s her apt. Daughter states that she will give pt the msg.

## 2019-08-03 NOTE — Assessment & Plan Note (Deleted)
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 OCT 30th 2020 myeloma markers-absent myeloma protein/normal kappa lambda light chain ratio.  # Re-START Revlimid 10 mg; 3w-On & 1 w-OFF since clinically doing much better.s/p BMT -[facility] next week. Hb 9.6; renal function LFTs-normal.  # awaiting BMbx at [place]- to rule out Amyloidosis [given weight loss]  # Dysphagia/ s/p EGD-dilatation- STABLE; not completely resolved.  See below.   # Weight loss at baseline 150s; now 129- unable to gain weight [good apetite]; will discuss with GI re: 09/04/2020EGD- will check re: Bx for amyoidsosi; Toni Arthurs re: Bx for amyloidosis. Will discuss with Dr.Anna.   # Bone lesions/pain stable last zometa on 11/27/2017.  Holding Zometa secondary to poor dentition. Needs dental evaluation/clerance.   # Smoking- counseled to quit smoking- 3 cig/day; start nicotine patch.   # DISPOSITION: # Follow up with 4 weeks; MD- cbc/cmp-Dr.B

## 2019-08-06 ENCOUNTER — Other Ambulatory Visit: Payer: Self-pay | Admitting: *Deleted

## 2019-08-06 DIAGNOSIS — C9002 Multiple myeloma in relapse: Secondary | ICD-10-CM

## 2019-08-06 NOTE — Telephone Encounter (Signed)
RF request incoming fax from [facility].  Dr. Jacinto Reap - ok to RF?

## 2019-08-07 MED ORDER — LENALIDOMIDE 10 MG PO CAPS: 10.0000 mg | ORAL_CAPSULE | Freq: Every day | ORAL | 0 refills | Status: DC

## 2019-08-07 NOTE — Telephone Encounter (Signed)
Ok to refill revlimid. Thanks GB

## 2019-08-07 NOTE — Progress Notes (Signed)
Called patient no answer left message  

## 2019-08-08 ENCOUNTER — Inpatient Hospital Stay: Payer: Medicare Other | Attending: Internal Medicine | Admitting: Internal Medicine

## 2019-08-08 ENCOUNTER — Encounter: Payer: Self-pay | Admitting: *Deleted

## 2019-08-08 ENCOUNTER — Other Ambulatory Visit: Payer: Self-pay

## 2019-08-08 ENCOUNTER — Inpatient Hospital Stay: Payer: Medicare Other

## 2019-08-08 VITALS — BP 112/96 | HR 86 | Temp 98.3°F | Resp 20 | Ht 61.0 in | Wt 120.0 lb

## 2019-08-08 DIAGNOSIS — I43 Cardiomyopathy in diseases classified elsewhere: Secondary | ICD-10-CM

## 2019-08-08 DIAGNOSIS — E854 Organ-limited amyloidosis: Secondary | ICD-10-CM | POA: Diagnosis not present

## 2019-08-08 DIAGNOSIS — F17210 Nicotine dependence, cigarettes, uncomplicated: Secondary | ICD-10-CM | POA: Diagnosis not present

## 2019-08-08 DIAGNOSIS — C9000 Multiple myeloma not having achieved remission: Secondary | ICD-10-CM

## 2019-08-08 DIAGNOSIS — R1310 Dysphagia, unspecified: Secondary | ICD-10-CM | POA: Insufficient documentation

## 2019-08-08 DIAGNOSIS — I10 Essential (primary) hypertension: Secondary | ICD-10-CM | POA: Diagnosis not present

## 2019-08-08 DIAGNOSIS — Z9484 Stem cells transplant status: Secondary | ICD-10-CM | POA: Diagnosis not present

## 2019-08-08 DIAGNOSIS — R600 Localized edema: Secondary | ICD-10-CM | POA: Diagnosis not present

## 2019-08-08 DIAGNOSIS — M7989 Other specified soft tissue disorders: Secondary | ICD-10-CM | POA: Diagnosis not present

## 2019-08-08 DIAGNOSIS — E876 Hypokalemia: Secondary | ICD-10-CM | POA: Diagnosis not present

## 2019-08-08 DIAGNOSIS — N179 Acute kidney failure, unspecified: Secondary | ICD-10-CM | POA: Diagnosis not present

## 2019-08-08 DIAGNOSIS — C9002 Multiple myeloma in relapse: Secondary | ICD-10-CM

## 2019-08-08 DIAGNOSIS — R634 Abnormal weight loss: Secondary | ICD-10-CM | POA: Diagnosis not present

## 2019-08-08 DIAGNOSIS — Z79899 Other long term (current) drug therapy: Secondary | ICD-10-CM | POA: Insufficient documentation

## 2019-08-08 DIAGNOSIS — Z7982 Long term (current) use of aspirin: Secondary | ICD-10-CM | POA: Diagnosis not present

## 2019-08-08 LAB — CBC WITH DIFFERENTIAL/PLATELET
Abs Immature Granulocytes: 0.01 10*3/uL (ref 0.00–0.07)
Basophils Absolute: 0 10*3/uL (ref 0.0–0.1)
Basophils Relative: 0 %
Eosinophils Absolute: 0.5 10*3/uL (ref 0.0–0.5)
Eosinophils Relative: 10 %
HCT: 33 % — ABNORMAL LOW (ref 36.0–46.0)
Hemoglobin: 10 g/dL — ABNORMAL LOW (ref 12.0–15.0)
Immature Granulocytes: 0 %
Lymphocytes Relative: 38 %
Lymphs Abs: 1.9 10*3/uL (ref 0.7–4.0)
MCH: 25.7 pg — ABNORMAL LOW (ref 26.0–34.0)
MCHC: 30.3 g/dL (ref 30.0–36.0)
MCV: 84.8 fL (ref 80.0–100.0)
Monocytes Absolute: 0.5 10*3/uL (ref 0.1–1.0)
Monocytes Relative: 9 %
Neutro Abs: 2.1 10*3/uL (ref 1.7–7.7)
Neutrophils Relative %: 43 %
Platelets: 153 10*3/uL (ref 150–400)
RBC: 3.89 MIL/uL (ref 3.87–5.11)
RDW: 14.6 % (ref 11.5–15.5)
WBC: 5 10*3/uL (ref 4.0–10.5)
nRBC: 0 % (ref 0.0–0.2)

## 2019-08-08 LAB — COMPREHENSIVE METABOLIC PANEL
ALT: 16 U/L (ref 0–44)
AST: 25 U/L (ref 15–41)
Albumin: 3.6 g/dL (ref 3.5–5.0)
Alkaline Phosphatase: 198 U/L — ABNORMAL HIGH (ref 38–126)
Anion gap: 9 (ref 5–15)
BUN: 10 mg/dL (ref 8–23)
CO2: 24 mmol/L (ref 22–32)
Calcium: 8.9 mg/dL (ref 8.9–10.3)
Chloride: 107 mmol/L (ref 98–111)
Creatinine, Ser: 0.34 mg/dL — ABNORMAL LOW (ref 0.44–1.00)
GFR calc Af Amer: >60 mL/min (ref >60)
GFR calc non Af Amer: >60 mL/min (ref >60)
Glucose, Bld: 107 mg/dL — ABNORMAL HIGH (ref 70–99)
Potassium: 2.6 mmol/L — CL (ref 3.5–5.1)
Sodium: 140 mmol/L (ref 135–145)
Total Bilirubin: 0.6 mg/dL (ref 0.3–1.2)
Total Protein: 6.5 g/dL (ref 6.5–8.1)

## 2019-08-08 LAB — TROPONIN I (HIGH SENSITIVITY): Troponin I (High Sensitivity): 13 ng/L (ref <18)

## 2019-08-08 LAB — MAGNESIUM: Magnesium: 1.5 mg/dL — ABNORMAL LOW (ref 1.7–2.4)

## 2019-08-08 LAB — BRAIN NATRIURETIC PEPTIDE: B Natriuretic Peptide: 631 pg/mL — ABNORMAL HIGH (ref 0.0–100.0)

## 2019-08-08 MED ORDER — NICOTINE 14 MG/24HR TD PT24: 14.0000 mg | MEDICATED_PATCH | Freq: Every day | TRANSDERMAL | 3 refills | Status: DC

## 2019-08-08 NOTE — Assessment & Plan Note (Addendum)
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 OCT 30th 2020 myeloma markers-absent myeloma protein/normal kappa lambda light chain ratio.  # Re-START Revlimid 10 mg; 3w-On & 1 w-OFF since clinically doing much better.s/p BMT -[facility] ; hemoglobin 10.; renal function LFTs-normal.  # awaiting BMbx at [place]- to rule out Amyloidosis [given weight loss]; not done- left message to speak to Ms. Naoma Diener NP.   # Bilateral Lower extretmity swlleing- check echo; BNP; troponin   # Dysphagia/ s/p EGD-dilatation- stable; not completely resolved.  See below.   # Weight loss at baseline 150s; now 129- unable to gain weight [good apetite]; s/p SEP /2020EGD-messaged pathology re: addition of [place] red. Will colo re: Bx for amyloidosis.   #   # Bone lesions/pain stable last zometa on 11/27/2017.  Holding Zometa secondary to poor dentition. Needs dental evaluation/clerance.   # Smoking- counseled to quit smoking- 3 cig/day; send a new prescription for patch.  # severe hypokalmeia-unclear etiology.  Recommend K DUR 20 BID.   # DISPOSITION: add mag/BNP/ tropoinins to labs today. # Follow up with 2 weeks; MD- cbc/cmp/MM panel; K/L light chains; 2 d echo ASAP-Dr.B  Addendum: Spoke to Ms.Jimmey Ralph, NP states that patient is scheduled for a bone marrow biopsy on January 28.  Patient is recommended for labs at 8 AM; bone marrow biopsy at 9.  Will inform patient/daughter.

## 2019-08-08 NOTE — Progress Notes (Signed)
[place] OFFICE PROGRESS NOTE  Patient Care Team: Center, [facility] as PCP - General ([place]) [name], [name]  Cancer Staging No matching staging information was found for the patient.   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 AnVicente Males# Acute renal failure [Dr.Singh; Proteinuria 1.5gm/day ]; acyclovir/Asprin ------------------------------------------------------------------------------------------------------------   DIAGNOSIS: '[ ]'  MULTIPLE MYELOMA  STAGE: III/HIGH RISK ;GOALS: CONTROL  CURRENT/MOST RECENT THERAPY-maintenance Revlimid    [place]A myel[place] ([place] 11/07/2017 -  Chemotherapy   The patient had palonosetron (ALOXI) injection 0.25 mg, 0.25 mg, Intravenous,  Once, 6 of 7 cycles Administration: 0.25 mg (11/21/2017), 0.25 mg (11/29/2017), 0.25 mg (12/05/2017), 0.25 mg (12/21/2017), 0.25 mg (12/28/2017), 0.25 mg (01/04/2018), 0.25 mg (01/18/2018), 0.25 mg (01/25/2018), 0.25 mg (02/01/2018), 0.25 mg (02/15/2018), 0.25 mg (02/22/2018), 0.25 mg  (03/01/2018), 0.25 mg (03/15/2018), 0.25 mg (03/22/2018), 0.25 mg (03/30/2018), 0.25 mg (04/13/2018), 0.25 mg (04/20/2018), 0.25 mg (04/27/2018) cyclophosphamide (CYTOXAN) 500 mg in sodium chloride 0.9 % 250 mL chemo infusion, 540 mg, Intravenous,  Once, 6 of 7 cycles Administration: 500 mg (11/21/2017), 500 mg (11/29/2017), 500 mg (12/05/2017), 500 mg (12/28/2017), 500 mg (01/04/2018), 500 mg (01/18/2018), 500 mg (01/25/2018), 500 mg (02/01/2018), 500 mg (02/15/2018), 500 mg (02/22/2018), 500 mg (03/01/2018), 500 mg (03/15/2018), 500 mg (03/30/2018), 500 mg (04/13/2018), 500 mg (04/20/2018), 500 mg (04/27/2018) carfilzomib (KYPROLIS) 36 mg in dextrose 5 % 50 mL chemo infusion, 20 mg/m2 = 36 mg, Intravenous, Once, 6 of 7 cycles Administration: 36 mg (11/21/2017), 36 mg (11/22/2017), 60 mg (11/29/2017), 60 mg (11/30/2017), 60 mg (12/05/2017), 60 mg (12/06/2017), 60 mg (12/21/2017), 60 mg (12/22/2017), 60 mg (12/28/2017), 60 mg (12/29/2017), 60 mg (01/04/2018), 60 mg (01/05/2018), 60 mg (01/18/2018), 60 mg (01/19/2018), 60 mg (01/25/2018), 60 mg (01/27/2018), 60 mg (02/01/2018), 60 mg (02/02/2018), 60 mg (02/15/2018), 60 mg (02/16/2018), 60 mg (02/22/2018), 60 mg (03/01/2018), 60 mg (03/02/2018), 60 mg (03/15/2018), 60 mg (03/16/2018), 60 mg (03/23/2018), 60 mg (03/30/2018), 60 mg (03/31/2018), 60 mg (04/13/2018), 60 mg (04/14/2018), 60 mg (04/20/2018), 60 mg (04/21/2018), 60 mg (04/27/2018)  for chemotherapy treatment.       INTERVAL HISTORY:  Brandi ROMERO89.o.  female pleasant patient above history of relapsed multiple myeloma--currently on maintenance Revlimid.   Patient has a good appetite.  However losing weight.  No nausea no vomiting.   Patient complains of intermittent swelling in the legs.  Had diarrhea 3 weeks ago.  Currently resolved.  Review of Systems  Constitutional: Positive for malaise/fatigue. Negative for chills, diaphoresis and fever.  HENT: Negative for nosebleeds and sore throat.   Eyes: Negative for double vision.  Respiratory: Negative  for hemoptysis, sputum production and shortness of breath.   Cardiovascular: Positive  for leg swelling. Negative for chest pain, palpitations and orthopnea.  Gastrointestinal: Negative for abdominal pain, blood in stool, constipation, heartburn and melena.  Genitourinary: Negative for dysuria, frequency and urgency.  Musculoskeletal: Positive for back pain and myalgias.  Skin: Negative.  Negative for itching and rash.  Neurological: Negative for dizziness, tingling, focal weakness, weakness and headaches.  Endo/Heme/Allergies: Does not bruise/bleed easily.  Psychiatric/Behavioral: Negative for depression. The patient is not nervous/anxious and does not have insomnia.     PAST MEDICAL HISTORY :  Past Medical History:  Diagnosis Date  . Hypertension   . Multiple myeloma ([place])     PAST SURGICAL HISTORY :   Past Surgical History:  Procedure Laterality Date  . ESOPHAGOGASTRODUODENOSCOPY (EGD) WITH PROPOFOL N/A 03/30/2019   Procedure: ESOPHAGOGASTRODUODENOSCOPY (EGD) WITH PROPOFOL;  Surgeon: Jonathon Bellows, MD;  Location: [facility] ENDOSCOPY;  Service: Gastroenterology;  Laterality: N/A;  . IR FLUORO GUIDE PORT INSERTION RIGHT  12/16/2017    FAMILY HISTORY :   Family History  Problem Relation Age of Onset  . Pneumonia Mother   . Seizures Father     SOCIAL HISTORY:   Social History   Tobacco Use  . Smoking status: Current Some Day Smoker    Packs/day: 0.50    Types: Cigarettes  . Smokeless tobacco: Never Used  Substance Use Topics  . Alcohol use: No  . Drug use: No    ALLERGIES:  has No Known Allergies.  MEDICATIONS:  Current Outpatient Medications  Medication Sig Dispense Refill  . acyclovir (ZOVIRAX) 400 MG tablet TAKE 1 TABLET BY MOUTH ONCE DAILY TO  PREVENT  SHINGLES 30 tablet 0  . albuterol (VENTOLIN HFA) 108 (90 Base) MCG/ACT inhaler Inhale 2 puffs into the lungs every 6 (six) hours as needed for wheezing or shortness of breath. 8 g 3  . amLODipine (NORVASC) 10 MG tablet Take  1 tablet by mouth once daily 90 tablet 2  . aspirin EC 325 MG tablet Take 81 mg by mouth daily.     . ergocalciferol (VITAMIN D2) 1.25 MG (50000 UT) capsule Take 1 capsule (50,000 Units total) by mouth once a week. 12 capsule 2  . feeding supplement, ENSURE ENLIVE, (ENSURE ENLIVE) LIQD Take 237 mLs by mouth 2 (two) times daily between meals. 60 Bottle 0  . lenalidomide (REVLIMID) 10 MG capsule Take 1 capsule (10 mg total) by mouth daily. Take for 21 days then hold for 7 days, Repeat every 28 days. 21 capsule 0  . pantoprazole (PROTONIX) 40 MG tablet Take 1 tablet (40 mg total) by mouth daily. 90 tablet 3  . potassium chloride SA (K-DUR) 20 MEQ tablet Take 1 tablet (20 mEq total) by mouth once for 1 dose. 30 tablet 6  . nicotine (NICODERM CQ - DOSED IN MG/24 HOURS) 14 mg/24hr patch Place 1 patch (14 mg total) onto the skin daily. 28 patch 3  . ondansetron (ZOFRAN) 8 MG tablet One pill every 8 hours as needed for nausea/vomitting. (Patient not taking: Reported on 08/08/2019) 40 tablet 1  . polyethylene glycol (MIRALAX / GLYCOLAX) packet Take 17 g by mouth daily as needed for mild constipation. (Patient not taking: Reported on 08/08/2019) 14 each 0  . prochlorperazine (COMPAZINE) 10 MG tablet Take 1 tablet (10 mg total) by mouth every 6 (six) hours as needed for nausea or vomiting. (Patient not taking: Reported on 08/08/2019) 30 tablet 3   No current facility-administered medications for this visit.   Facility-Administered Medications Ordered in Other Visits  Medication Dose  Route Frequency Provider Last Rate Last Admin  . sodium chloride flush (NS) 0.9 % injection 10 mL  10 mL Intracatheter PRN [name], MD   10 mL at 02/23/18 1400    PHYSICAL EXAMINATION: ECOG PERFORMANCE STATUS: 1 - Symptomatic but completely ambulatory  BP (!) 112/96 (Patient Position: Sitting)   Pulse 86   Temp 98.3 F (36.8 C) (Tympanic)   Resp 20   Ht '5\' 1"'  (1.549 m)   Wt 120 lb (54.4 kg)   BMI 22.67 kg/m    Filed Weights   08/08/19 1407  Weight: 120 lb (54.4 kg)    Physical Exam  Constitutional: She is oriented to person, place, and time and well-developed, well-nourished, and in no distress.  Alone. She is walking herself.  HENT:  Head: Normocephalic and atraumatic.  Poor dentition.  Eyes: Pupils are equal, round, and reactive to light.  Cardiovascular: Normal rate and regular rhythm.  Pulmonary/Chest: Effort normal and breath sounds normal. No respiratory distress. She has no wheezes.  Abdominal: Soft. Bowel sounds are normal. She exhibits no distension and no mass. There is no abdominal tenderness. There is no rebound and no guarding.  Musculoskeletal:        General: Edema present. No tenderness. Normal range of motion.     Cervical back: Normal range of motion and neck supple.  Neurological: She is alert and oriented to person, place, and time.  Skin: Skin is warm.  Psychiatric: Affect normal.   LABORATORY DATA:  I have reviewed the data as listed    Component Value Date/Time   NA 140 08/08/2019 1351   K 2.6 (LL) 08/08/2019 1351   CL 107 08/08/2019 1351   CO2 24 08/08/2019 1351   GLUCOSE 107 (H) 08/08/2019 1351   BUN 10 08/08/2019 1351   CREATININE 0.34 (L) 08/08/2019 1351   CALCIUM 8.9 08/08/2019 1351   PROT 6.5 08/08/2019 1351   ALBUMIN 3.6 08/08/2019 1351   AST 25 08/08/2019 1351   ALT 16 08/08/2019 1351   ALKPHOS 198 (H) 08/08/2019 1351   BILITOT 0.6 08/08/2019 1351   GFRNONAA >60 08/08/2019 1351   GFRAA >60 08/08/2019 1351    No results found for: SPEP, UPEP  Lab Results  Component Value Date   WBC 5.0 08/08/2019   NEUTROABS 2.1 08/08/2019   HGB 10.0 (L) 08/08/2019   HCT 33.0 (L) 08/08/2019   MCV 84.8 08/08/2019   PLT 153 08/08/2019      Chemistry      Component Value Date/Time   NA 140 08/08/2019 1351   K 2.6 (LL) 08/08/2019 1351   CL 107 08/08/2019 1351   CO2 24 08/08/2019 1351   BUN 10 08/08/2019 1351   CREATININE 0.34 (L) 08/08/2019 1351       Component Value Date/Time   CALCIUM 8.9 08/08/2019 1351   ALKPHOS 198 (H) 08/08/2019 1351   AST 25 08/08/2019 1351   ALT 16 08/08/2019 1351   BILITOT 0.6 08/08/2019 1351       RADIOGRAPHIC STUDIES: I have personally reviewed the radiological images as listed and agreed with the findings in the report. No results found.   ASSESSMENT & PLAN:  IgA myeloma ([place]) #Multiple myeloma stage III high-risk cytogenetics-status post KRD-VG PR ; status post autologousstem cell transplant on 06/15/18. OCT 30th 2020 myeloma markers-absent myeloma protein/normal kappa lambda light chain ratio.  # Re-START Revlimid 10 mg; 3w-On & 1 w-OFF since clinically doing much better.s/p BMT -[facility] ; hemoglobin 10.; renal function LFTs-normal.  #  awaiting [facility] at [facility]- to rule out Amyloidosis [given weight loss]; not done- left message to speak to Ms. Naoma Diener NP.   # Bilateral Lower extretmity swlleing- check echo; BNP; troponin   # Dysphagia/ s/p EGD-dilatation- stable; not completely resolved.  See below.   # Weight loss at baseline 150s; now 129- unable to gain weight [good apetite]; s/p SEP /2020EGD-messaged pathology re: addition of congo red. Will colo re: Bx for amyloidosis.   #   # Bone lesions/pain stable last zometa on 11/27/2017.  Holding Zometa secondary to poor dentition. Needs dental evaluation/clerance.   # Smoking- counseled to quit smoking- 3 cig/day; send a new prescription for patch.  # severe hypokalmeia-unclear etiology.  Recommend K DUR 20 BID.   # DISPOSITION: add mag/BNP/ tropoinins to labs today. # Follow up with 2 weeks; MD- cbc/cmp/MM panel; K/L light chains; 2 d echo ASAP-Dr.B  Addendum: Spoke to Ms.Jimmey Ralph, NP states that patient is scheduled for a bone marrow biopsy on January 28.  Patient is recommended for labs at 8 AM; bone marrow biopsy at 9.  Will inform patient/daughter.  Orders Placed This Encounter  Procedures  . Magnesium    Standing Status:    Future    Standing Expiration Date:   08/07/2020  . Brain natriuretic peptide    Standing Status:   Future    Standing Expiration Date:   02/04/2021  . Troponin I  . CBC    Standing Status:   Future    Standing Expiration Date:   08/07/2020  . Comprehensive metabolic panel    Standing Status:   Future    Standing Expiration Date:   08/07/2020  . Multiple Myeloma Panel (SPEP&IFE w/QIG)    Standing Status:   Future    Standing Expiration Date:   08/07/2020  . Kappa/lambda light chains    Standing Status:   Future    Standing Expiration Date:   08/07/2020  . Magnesium    Standing Status:   Future    Number of Occurrences:   1    Standing Expiration Date:   08/07/2020  . Brain natriuretic peptide    Standing Status:   Future    Number of Occurrences:   1    Standing Expiration Date:   08/07/2020  . Troponin I    Standing Status:   Future    Number of Occurrences:   1    Standing Expiration Date:   08/07/2020  . CBC with Differential    Standing Status:   Future    Standing Expiration Date:   08/07/2020  . Comprehensive metabolic panel    Standing Status:   Future    Standing Expiration Date:   08/07/2020  . Multiple Myeloma Panel (SPEP&IFE w/QIG)    Standing Status:   Future    Standing Expiration Date:   08/07/2020  . Kappa/lambda light chains    Standing Status:   Future    Standing Expiration Date:   08/07/2020  . Troponin T    Standing Status:   Future    Standing Expiration Date:   08/07/2020  . ECHOCARDIOGRAM COMPLETE    Standing Status:   Future    Standing Expiration Date:   11/05/2020    Order Specific Question:   Where should this test be performed    Answer:   [facility]    Order Specific Question:   Perflutren DEFINITY (image enhancing agent) should be administered unless hypersensitivity or allergy exist  Answer:   Administer Perflutren    Order Specific Question:   Reason for exam-Echo    Answer:   Cardiomyopathy-Unspecified  425.9 / I42.9   All questions  were answered. The patient knows to call the clinic with any problems, questions or concerns.      [name], MD 08/08/2019 3:26 PM

## 2019-08-08 NOTE — Progress Notes (Signed)
1420- Read back process performed with labtech/Dr. Rogue Bussing. Notified of critical potassium level of 2.6

## 2019-08-09 LAB — SURGICAL PATHOLOGY

## 2019-08-15 ENCOUNTER — Other Ambulatory Visit: Payer: Self-pay | Admitting: *Deleted

## 2019-08-15 ENCOUNTER — Telehealth: Payer: Self-pay | Admitting: *Deleted

## 2019-08-15 DIAGNOSIS — C9002 Multiple myeloma in relapse: Secondary | ICD-10-CM

## 2019-08-15 MED ORDER — LENALIDOMIDE 10 MG PO CAPS: 10.0000 mg | ORAL_CAPSULE | Freq: Every day | ORAL | 0 refills | Status: DC

## 2019-08-15 NOTE — Telephone Encounter (Signed)
[place] from [facility] called and states Revlimid sent to her pharmacy [facility]) cannot be filled and that prescription should be sent to them instead Fax [phone]. (It has been sent to [facility] in past). Please advise.

## 2019-08-15 NOTE — Telephone Encounter (Signed)
Script resubmitted to [facility]

## 2019-08-20 ENCOUNTER — Other Ambulatory Visit: Payer: Self-pay

## 2019-08-20 ENCOUNTER — Ambulatory Visit
Admission: RE | Admit: 2019-08-20 | Discharge: 2019-08-20 | Disposition: A | Discharge status: Home / Self Care | Payer: Medicare Other | Source: Ambulatory Visit | Attending: Internal Medicine | Admitting: Internal Medicine

## 2019-08-20 DIAGNOSIS — I429 Cardiomyopathy, unspecified: Secondary | ICD-10-CM | POA: Diagnosis not present

## 2019-08-20 DIAGNOSIS — I10 Essential (primary) hypertension: Secondary | ICD-10-CM | POA: Diagnosis not present

## 2019-08-20 DIAGNOSIS — I43 Cardiomyopathy in diseases classified elsewhere: Secondary | ICD-10-CM | POA: Insufficient documentation

## 2019-08-20 DIAGNOSIS — E854 Organ-limited amyloidosis: Secondary | ICD-10-CM | POA: Diagnosis not present

## 2019-08-20 DIAGNOSIS — C9000 Multiple myeloma not having achieved remission: Secondary | ICD-10-CM | POA: Diagnosis present

## 2019-08-20 DIAGNOSIS — I082 Rheumatic disorders of both aortic and tricuspid valves: Secondary | ICD-10-CM | POA: Insufficient documentation

## 2019-08-20 NOTE — Progress Notes (Signed)
*  PRELIMINARY RESULTS* Echocardiogram 2D Echocardiogram has been performed.  Sherrie Sport 08/20/2019, 10:41 AM

## 2019-08-22 ENCOUNTER — Other Ambulatory Visit: Payer: Self-pay

## 2019-08-22 ENCOUNTER — Inpatient Hospital Stay (HOSPITAL_BASED_OUTPATIENT_CLINIC_OR_DEPARTMENT_OTHER): Payer: Medicare Other | Admitting: Internal Medicine

## 2019-08-22 ENCOUNTER — Encounter: Payer: Self-pay | Admitting: *Deleted

## 2019-08-22 ENCOUNTER — Inpatient Hospital Stay: Payer: Medicare Other

## 2019-08-22 VITALS — BP 123/71 | HR 90 | Temp 97.2°F | Resp 22 | Ht 61.0 in | Wt 115.0 lb

## 2019-08-22 DIAGNOSIS — C9000 Multiple myeloma not having achieved remission: Secondary | ICD-10-CM | POA: Diagnosis not present

## 2019-08-22 DIAGNOSIS — I43 Cardiomyopathy in diseases classified elsewhere: Secondary | ICD-10-CM

## 2019-08-22 DIAGNOSIS — R634 Abnormal weight loss: Secondary | ICD-10-CM | POA: Diagnosis not present

## 2019-08-22 DIAGNOSIS — E854 Organ-limited amyloidosis: Secondary | ICD-10-CM

## 2019-08-22 LAB — CBC WITH DIFFERENTIAL/PLATELET
Abs Immature Granulocytes: 0.01 10*3/uL (ref 0.00–0.07)
Basophils Absolute: 0.1 10*3/uL (ref 0.0–0.1)
Basophils Relative: 1 %
Eosinophils Absolute: 0.3 10*3/uL (ref 0.0–0.5)
Eosinophils Relative: 5 %
HCT: 34.6 % — ABNORMAL LOW (ref 36.0–46.0)
Hemoglobin: 10.4 g/dL — ABNORMAL LOW (ref 12.0–15.0)
Immature Granulocytes: 0 %
Lymphocytes Relative: 48 %
Lymphs Abs: 3.1 10*3/uL (ref 0.7–4.0)
MCH: 26 pg (ref 26.0–34.0)
MCHC: 30.1 g/dL (ref 30.0–36.0)
MCV: 86.5 fL (ref 80.0–100.0)
Monocytes Absolute: 0.5 10*3/uL (ref 0.1–1.0)
Monocytes Relative: 8 %
Neutro Abs: 2.5 10*3/uL (ref 1.7–7.7)
Neutrophils Relative %: 38 %
Platelets: 226 10*3/uL (ref 150–400)
RBC: 4 MIL/uL (ref 3.87–5.11)
RDW: 14.6 % (ref 11.5–15.5)
WBC: 6.4 10*3/uL (ref 4.0–10.5)
nRBC: 0 % (ref 0.0–0.2)

## 2019-08-22 LAB — COMPREHENSIVE METABOLIC PANEL
ALT: 19 U/L (ref 0–44)
AST: 26 U/L (ref 15–41)
Albumin: 3.5 g/dL (ref 3.5–5.0)
Alkaline Phosphatase: 236 U/L — ABNORMAL HIGH (ref 38–126)
Anion gap: 9 (ref 5–15)
BUN: 11 mg/dL (ref 8–23)
CO2: 24 mmol/L (ref 22–32)
Calcium: 9.6 mg/dL (ref 8.9–10.3)
Chloride: 106 mmol/L (ref 98–111)
Creatinine, Ser: <0.3 mg/dL — ABNORMAL LOW (ref 0.44–1.00)
Glucose, Bld: 102 mg/dL — ABNORMAL HIGH (ref 70–99)
Potassium: 3 mmol/L — ABNORMAL LOW (ref 3.5–5.1)
Sodium: 139 mmol/L (ref 135–145)
Total Bilirubin: 0.6 mg/dL (ref 0.3–1.2)
Total Protein: 6.9 g/dL (ref 6.5–8.1)

## 2019-08-22 LAB — PROTEIN / CREATININE RATIO, URINE
Creatinine, Urine: 54 mg/dL
Protein Creatinine Ratio: 0.3 mg/mg{Cre} — ABNORMAL HIGH (ref 0.00–0.15)
Total Protein, Urine: 16 mg/dL

## 2019-08-22 MED ORDER — ALBUTEROL SULFATE HFA 108 (90 BASE) MCG/ACT IN AERS: 2.0000 | INHALATION_SPRAY | Freq: Four times a day (QID) | RESPIRATORY_TRACT | 3 refills | Status: DC | PRN

## 2019-08-22 MED ORDER — MEGESTROL ACETATE 625 MG/5ML PO SUSP: 625.0000 mg | Freq: Every day | ORAL | 0 refills | Status: DC

## 2019-08-22 NOTE — Assessment & Plan Note (Addendum)
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 OCT 30th 2020 myeloma markers-absent myeloma protein/normal kappa lambda light chain ratio.  Stable.  Myeloma protein from today pending.  #Patient currently on Revlimid maintenance; [10 mg; 3w-On & 1 w-OFF]; however feeling poorly-see discussion below.   #Weight loss /lower extremity swelling -unclear etiology awaiting repeat bone marrow biopsy-to rule out amyloidosis.  On January 28  [reminded the patient/written instructions given]-to rule out amyloidosis.  Start Megace.  Also make a referral to nutrition.  Check urine protein.  # Bilateral Lower extretmity swlleing-slight elevated BNP; however echo is normal.  Hold off diuretics given significant hypokalemia.  Recommend compression stockings.  Elevation at night.  #Hypokalemia potassium 3.0 continue p.o. potassium twice a day.  # Bone lesions/pain stable last zometa on 11/27/2017.  Holding Zometa secondary to poor dentition. Needs dental evaluation/clerance.   #Work note given for daughter/transportation.  # DISPOSITION:  # Urine protein-  # [place] referal re: weight loss # Follow up with 2 weeks; MD- cbc/cmp;-Dr.B

## 2019-08-22 NOTE — Progress Notes (Signed)
[place] OFFICE PROGRESS NOTE  Patient Care Team: Center, [facility] as PCP - General ([place]) [name], [name]  Cancer Staging No matching staging information was found for the patient.   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 AnVicente Males# Acute renal failure [Dr.Singh; Proteinuria 1.5gm/day ]; acyclovir/Asprin ------------------------------------------------------------------------------------------------------------   DIAGNOSIS: '[ ]'  MULTIPLE MYELOMA  STAGE: III/HIGH RISK ;GOALS: CONTROL  CURRENT/MOST RECENT THERAPY-maintenance Revlimid    IgA myeloma ([facility] 11/07/2017 -  Chemotherapy   The patient had palonosetron (ALOXI) injection 0.25 mg, 0.25 mg, Intravenous,  Once, 6 of 7 cycles Administration: 0.25 mg (11/21/2017), 0.25 mg (11/29/2017), 0.25 mg (12/05/2017), 0.25 mg (12/21/2017), 0.25 mg (12/28/2017), 0.25 mg (01/04/2018), 0.25 mg (01/18/2018), 0.25 mg (01/25/2018), 0.25 mg (02/01/2018), 0.25 mg (02/15/2018), 0.25 mg (02/22/2018), 0.25 mg  (03/01/2018), 0.25 mg (03/15/2018), 0.25 mg (03/22/2018), 0.25 mg (03/30/2018), 0.25 mg (04/13/2018), 0.25 mg (04/20/2018), 0.25 mg (04/27/2018) cyclophosphamide (CYTOXAN) 500 mg in sodium chloride 0.9 % 250 mL chemo infusion, 540 mg, Intravenous,  Once, 6 of 7 cycles Administration: 500 mg (11/21/2017), 500 mg (11/29/2017), 500 mg (12/05/2017), 500 mg (12/28/2017), 500 mg (01/04/2018), 500 mg (01/18/2018), 500 mg (01/25/2018), 500 mg (02/01/2018), 500 mg (02/15/2018), 500 mg (02/22/2018), 500 mg (03/01/2018), 500 mg (03/15/2018), 500 mg (03/30/2018), 500 mg (04/13/2018), 500 mg (04/20/2018), 500 mg (04/27/2018) carfilzomib (KYPROLIS) 36 mg in dextrose 5 % 50 mL chemo infusion, 20 mg/m2 = 36 mg, Intravenous, Once, 6 of 7 cycles Administration: 36 mg (11/21/2017), 36 mg (11/22/2017), 60 mg (11/29/2017), 60 mg (11/30/2017), 60 mg (12/05/2017), 60 mg (12/06/2017), 60 mg (12/21/2017), 60 mg (12/22/2017), 60 mg (12/28/2017), 60 mg (12/29/2017), 60 mg (01/04/2018), 60 mg (01/05/2018), 60 mg (01/18/2018), 60 mg (01/19/2018), 60 mg (01/25/2018), 60 mg (01/27/2018), 60 mg (02/01/2018), 60 mg (02/02/2018), 60 mg (02/15/2018), 60 mg (02/16/2018), 60 mg (02/22/2018), 60 mg (03/01/2018), 60 mg (03/02/2018), 60 mg (03/15/2018), 60 mg (03/16/2018), 60 mg (03/23/2018), 60 mg (03/30/2018), 60 mg (03/31/2018), 60 mg (04/13/2018), 60 mg (04/14/2018), 60 mg (04/20/2018), 60 mg (04/21/2018), 60 mg (04/27/2018)  for chemotherapy treatment.       INTERVAL HISTORY:  Brandi [id number].  female pleasant patient above history of relapsed multiple myeloma--currently on maintenance Revlimid.   Patient was last seen about 2 weeks ago-patient complains of continued weight loss.  Complains of nausea.  No vomiting.  Continues to complain of worsening swelling the legs.  Complains of fatigue.  Complains of shortness of breath-worsening.  Mild cough but no hemoptysis.   Review of Systems  Constitutional: Positive for malaise/fatigue. Negative for chills, diaphoresis and fever.  HENT: Negative  for nosebleeds and sore throat.   Eyes: Negative for double vision.  Respiratory:  Negative for hemoptysis, sputum production and shortness of breath.   Cardiovascular: Positive for leg swelling. Negative for chest pain, palpitations and orthopnea.  Gastrointestinal: Negative for abdominal pain, blood in stool, constipation, heartburn and melena.  Genitourinary: Negative for dysuria, frequency and urgency.  Musculoskeletal: Positive for back pain and myalgias.  Skin: Negative.  Negative for itching and rash.  Neurological: Negative for dizziness, tingling, focal weakness, weakness and headaches.  Endo/Heme/Allergies: Does not bruise/bleed easily.  Psychiatric/Behavioral: Negative for depression. The patient is not nervous/anxious and does not have insomnia.     PAST MEDICAL HISTORY :  Past Medical History:  Diagnosis Date  . Hypertension   . Multiple myeloma ([place])     PAST SURGICAL HISTORY :   Past Surgical History:  Procedure Laterality Date  . ESOPHAGOGASTRODUODENOSCOPY (EGD) WITH PROPOFOL N/A 03/30/2019   Procedure: ESOPHAGOGASTRODUODENOSCOPY (EGD) WITH PROPOFOL;  Surgeon: Jonathon Bellows, MD;  Location: [facility] ENDOSCOPY;  Service: Gastroenterology;  Laterality: N/A;  . IR FLUORO GUIDE PORT INSERTION RIGHT  12/16/2017    FAMILY HISTORY :   Family History  Problem Relation Age of Onset  . Pneumonia Mother   . Seizures Father     SOCIAL HISTORY:   Social History   Tobacco Use  . Smoking status: Current Some Day Smoker    Packs/day: 0.50    Types: Cigarettes  . Smokeless tobacco: Never Used  Substance Use Topics  . Alcohol use: No  . Drug use: No    ALLERGIES:  has No Known Allergies.  MEDICATIONS:  Current Outpatient Medications  Medication Sig Dispense Refill  . acyclovir (ZOVIRAX) 400 MG tablet TAKE 1 TABLET BY MOUTH ONCE DAILY TO  PREVENT  SHINGLES 30 tablet 0  . albuterol (VENTOLIN HFA) 108 (90 Base) MCG/ACT inhaler Inhale 2 puffs into the lungs every 6 (six) hours as  needed for wheezing or shortness of breath. 8 g 3  . amLODipine (NORVASC) 10 MG tablet Take 1 tablet by mouth once daily 90 tablet 2  . aspirin EC 325 MG tablet Take 81 mg by mouth daily.     . ergocalciferol (VITAMIN D2) 1.25 MG (50000 UT) capsule Take 1 capsule (50,000 Units total) by mouth once a week. 12 capsule 2  . feeding supplement, ENSURE ENLIVE, (ENSURE ENLIVE) LIQD Take 237 mLs by mouth 2 (two) times daily between meals. 60 Bottle 0  . lenalidomide (REVLIMID) 10 MG capsule Take 1 capsule (10 mg total) by mouth daily. Take for 21 days then hold for 7 days, Repeat every 28 days. 21 capsule 0  . pantoprazole (PROTONIX) 40 MG tablet Take 1 tablet (40 mg total) by mouth daily. 90 tablet 3  . polyethylene glycol (MIRALAX / GLYCOLAX) packet Take 17 g by mouth daily as needed for mild constipation. 14 each 0  . potassium chloride SA (K-DUR) 20 MEQ tablet Take 1 tablet (20 mEq total) by mouth once for 1 dose. 30 tablet 6  . megestrol (MEGACE ES) 625 MG/5ML suspension Take 5 mLs (625 mg total) by mouth daily. 150 mL 0  . nicotine (NICODERM CQ - DOSED IN MG/24 HOURS) 14 mg/24hr patch Place 1 patch (14 mg total) onto the skin daily. (Patient not taking: Reported on 08/22/2019) 28 patch 3  . ondansetron (ZOFRAN) 8 MG tablet One pill every 8 hours as needed for nausea/vomitting. (Patient not taking: Reported on 08/08/2019) 40 tablet 1  . prochlorperazine (COMPAZINE) 10 MG tablet Take 1 tablet (10 mg total) by mouth every 6 (six) hours as needed  for nausea or vomiting. (Patient not taking: Reported on 08/08/2019) 30 tablet 3   No current facility-administered medications for this visit.   Facility-Administered Medications Ordered in Other Visits  Medication Dose Route Frequency Provider Last Rate Last Admin  . sodium chloride flush (NS) 0.9 % injection 10 mL  10 mL Intracatheter PRN [name], MD   10 mL at 02/23/18 1400    PHYSICAL EXAMINATION: ECOG PERFORMANCE STATUS: 1 - Symptomatic but  completely ambulatory  BP 123/71   Pulse 90   Temp (!) 97.2 F (36.2 C) (Tympanic)   Resp (!) 22   Ht '5\' 1"'  (1.549 m)   Wt 115 lb (52.2 kg)   SpO2 99%   BMI 21.73 kg/m   Filed Weights   08/22/19 1123  Weight: 115 lb (52.2 kg)    Physical Exam  Constitutional: She is oriented to person, place, and time and well-developed, well-nourished, and in no distress.  Alone. She is walking herself.  HENT:  Head: Normocephalic and atraumatic.  Poor dentition.  Eyes: Pupils are equal, round, and reactive to light.  Cardiovascular: Normal rate and regular rhythm.  Pulmonary/Chest: Effort normal and breath sounds normal. No respiratory distress. She has no wheezes.  Abdominal: Soft. Bowel sounds are normal. She exhibits no distension and no mass. There is no abdominal tenderness. There is no rebound and no guarding.  Musculoskeletal:        General: Edema present. No tenderness. Normal range of motion.     Cervical back: Normal range of motion and neck supple.  Neurological: She is alert and oriented to person, place, and time.  Skin: Skin is warm.  Psychiatric: Affect normal.   LABORATORY DATA:  I have reviewed the data as listed    Component Value Date/Time   NA 139 08/22/2019 1055   K 3.0 (L) 08/22/2019 1055   CL 106 08/22/2019 1055   CO2 24 08/22/2019 1055   GLUCOSE 102 (H) 08/22/2019 1055   BUN 11 08/22/2019 1055   CREATININE <0.30 (L) 08/22/2019 1055   CALCIUM 9.6 08/22/2019 1055   PROT 6.9 08/22/2019 1055   ALBUMIN 3.5 08/22/2019 1055   AST 26 08/22/2019 1055   ALT 19 08/22/2019 1055   ALKPHOS 236 (H) 08/22/2019 1055   BILITOT 0.6 08/22/2019 1055   GFRNONAA NOT CALCULATED 08/22/2019 1055   GFRAA NOT CALCULATED 08/22/2019 1055    No results found for: SPEP, UPEP  Lab Results  Component Value Date   WBC 6.4 08/22/2019   NEUTROABS 2.5 08/22/2019   HGB 10.4 (L) 08/22/2019   HCT 34.6 (L) 08/22/2019   MCV 86.5 08/22/2019   PLT 226 08/22/2019      Chemistry       Component Value Date/Time   NA 139 08/22/2019 1055   K 3.0 (L) 08/22/2019 1055   CL 106 08/22/2019 1055   CO2 24 08/22/2019 1055   BUN 11 08/22/2019 1055   CREATININE <0.30 (L) 08/22/2019 1055      Component Value Date/Time   CALCIUM 9.6 08/22/2019 1055   ALKPHOS 236 (H) 08/22/2019 1055   AST 26 08/22/2019 1055   ALT 19 08/22/2019 1055   BILITOT 0.6 08/22/2019 1055       RADIOGRAPHIC STUDIES: I have personally reviewed the radiological images as listed and agreed with the findings in the report. No results found.   ASSESSMENT & PLAN:  IgA myeloma ([place]) #Multiple myeloma stage III high-risk cytogenetics-status post KRD-VG PR ; status post autologousstem cell transplant  on 06/15/18. OCT 30th 2020 myeloma markers-absent myeloma protein/normal kappa lambda light chain ratio.  Stable.  Myeloma protein from today pending.  #Patient currently on Revlimid maintenance; [10 mg; 3w-On & 1 w-OFF]; however feeling poorly-see discussion below.   #Weight loss /lower extremity swelling -unclear etiology awaiting repeat bone marrow biopsy-to rule out amyloidosis.  On January 28  [reminded the patient/written instructions given]-to rule out amyloidosis.  Start Megace.  Also make a referral to nutrition.  Check urine protein.  # Bilateral Lower extretmity swlleing-slight elevated BNP; however echo is normal.  Hold off diuretics given significant hypokalemia.  Recommend compression stockings.  Elevation at night.  #Hypokalemia potassium 3.0 continue p.o. potassium twice a day.  # Bone lesions/pain stable last zometa on 11/27/2017.  Holding Zometa secondary to poor dentition. Needs dental evaluation/clerance.   #Work note given for daughter/transportation.  # DISPOSITION:  # Urine protein-  # Joli referal re: weight loss # Follow up with 2 weeks; MD- cbc/cmp;-Dr.B    Orders Placed This Encounter  Procedures  . Protein / creatinine ratio, urine    Standing Status:   Future    Number of  Occurrences:   1    Standing Expiration Date:   08/21/2020  . CBC with Differential    Standing Status:   Future    Standing Expiration Date:   08/21/2020  . Comprehensive metabolic panel    Standing Status:   Future    Standing Expiration Date:   08/21/2020  . Amb Referral to Nutrition and Diabetic E    Referral Priority:   Urgent    Referral Type:   Consultation    Referral Reason:   Specialty Services Required    Number of Visits Requested:   1   All questions were answered. The patient knows to call the clinic with any problems, questions or concerns.      [name], MD 08/22/2019 1:11 PM

## 2019-08-22 NOTE — Patient Instructions (Signed)
On 1/28- 8 am/labs; bone marrow Biopsy. Call to confirm- Marcelle Overlie, NP - [address] [place], [place] [address] Ph- [phone]

## 2019-08-23 LAB — KAPPA/LAMBDA LIGHT CHAINS
Kappa free light chain: 17.2 mg/L (ref 3.3–19.4)
Kappa, lambda light chain ratio: 0.4 (ref 0.26–1.65)
Lambda free light chains: 42.8 mg/L — ABNORMAL HIGH (ref 5.7–26.3)

## 2019-08-25 LAB — MULTIPLE MYELOMA PANEL, SERUM
Albumin SerPl Elph-Mcnc: 3.4 g/dL (ref 2.9–4.4)
Albumin/Glob SerPl: 1.2 (ref 0.7–1.7)
Alpha 1: 0.2 g/dL (ref 0.0–0.4)
Alpha2 Glob SerPl Elph-Mcnc: 0.7 g/dL (ref 0.4–1.0)
B-Globulin SerPl Elph-Mcnc: 0.9 g/dL (ref 0.7–1.3)
Gamma Glob SerPl Elph-Mcnc: 1.1 g/dL (ref 0.4–1.8)
Globulin, Total: 2.9 g/dL (ref 2.2–3.9)
IgA: 242 mg/dL (ref 87–352)
IgG (Immunoglobin G), Serum: 1268 mg/dL (ref 586–1602)
IgM (Immunoglobulin M), Srm: 34 mg/dL (ref 26–217)
Total Protein ELP: 6.3 g/dL (ref 6.0–8.5)

## 2019-09-05 ENCOUNTER — Inpatient Hospital Stay: Payer: Medicare Other | Admitting: Internal Medicine

## 2019-09-05 ENCOUNTER — Inpatient Hospital Stay: Payer: Medicare Other | Attending: Internal Medicine

## 2019-09-05 DIAGNOSIS — C9002 Multiple myeloma in relapse: Secondary | ICD-10-CM | POA: Insufficient documentation

## 2019-09-05 DIAGNOSIS — E876 Hypokalemia: Secondary | ICD-10-CM | POA: Insufficient documentation

## 2019-09-05 DIAGNOSIS — Z9484 Stem cells transplant status: Secondary | ICD-10-CM | POA: Insufficient documentation

## 2019-09-05 DIAGNOSIS — I110 Hypertensive heart disease with heart failure: Secondary | ICD-10-CM | POA: Insufficient documentation

## 2019-09-05 DIAGNOSIS — Z79899 Other long term (current) drug therapy: Secondary | ICD-10-CM | POA: Insufficient documentation

## 2019-09-05 DIAGNOSIS — F17210 Nicotine dependence, cigarettes, uncomplicated: Secondary | ICD-10-CM | POA: Insufficient documentation

## 2019-09-05 DIAGNOSIS — I5030 Unspecified diastolic (congestive) heart failure: Secondary | ICD-10-CM | POA: Insufficient documentation

## 2019-09-06 ENCOUNTER — Inpatient Hospital Stay: Payer: Medicare Other

## 2019-09-06 NOTE — Progress Notes (Signed)
Nutrition  Called patient for nutrition assessment as on RD's schedule today.  No answer.  Left message with call back number.   Myesha Stillion B. Zenia Resides, [place], [place] Registered Dietitian [phone] (pager)

## 2019-09-14 ENCOUNTER — Other Ambulatory Visit: Payer: Self-pay | Admitting: *Deleted

## 2019-09-14 DIAGNOSIS — C9002 Multiple myeloma in relapse: Secondary | ICD-10-CM

## 2019-09-14 MED ORDER — LENALIDOMIDE 10 MG PO CAPS: 10.0000 mg | ORAL_CAPSULE | Freq: Every day | ORAL | 0 refills | Status: DC

## 2019-09-14 NOTE — Telephone Encounter (Signed)
Pt needs to see me in clinic ; ok with revlimid.   Follow up next week MD;labs- cbc/bmp  Thanks

## 2019-09-14 NOTE — Telephone Encounter (Signed)
Received incoming fax from [facility] requesting [facility] survey/RF for patient's revlimid. Dr. B do we need to renew. Patient had an apt a few weeks ago at [facility] for bone marrow biopsy. Please advise. She has no other f/u with our clinic and no showed on 2/10, which was her bone marrow bx date at [facility]. I do not see any scheduled f/u at [facility] in care everywhere

## 2019-09-14 NOTE — Addendum Note (Signed)
Addended by: Gloris Ham on: 09/14/2019 04:07 PM   Modules accepted: Orders

## 2019-09-14 NOTE — Telephone Encounter (Signed)
Patient agreeable to apt on Tuesday afternoon 2/22 at 3pm. apts entered for lab/md. revlimid renewed per md order

## 2019-09-17 ENCOUNTER — Other Ambulatory Visit: Payer: Self-pay | Admitting: *Deleted

## 2019-09-17 DIAGNOSIS — C9002 Multiple myeloma in relapse: Secondary | ICD-10-CM

## 2019-09-17 MED ORDER — LENALIDOMIDE 10 MG PO CAPS: 10.0000 mg | ORAL_CAPSULE | Freq: Every day | ORAL | 0 refills | Status: DC

## 2019-09-18 ENCOUNTER — Other Ambulatory Visit: Payer: Self-pay | Admitting: Internal Medicine

## 2019-09-18 ENCOUNTER — Ambulatory Visit
Admission: RE | Admit: 2019-09-18 | Discharge: 2019-09-18 | Disposition: A | Discharge status: Home / Self Care | Payer: Medicare Other | Source: Ambulatory Visit | Attending: Internal Medicine | Admitting: Internal Medicine

## 2019-09-18 ENCOUNTER — Other Ambulatory Visit: Payer: Self-pay | Admitting: *Deleted

## 2019-09-18 ENCOUNTER — Encounter: Payer: Self-pay | Admitting: Licensed Clinical Social Worker

## 2019-09-18 ENCOUNTER — Other Ambulatory Visit: Payer: Self-pay

## 2019-09-18 ENCOUNTER — Inpatient Hospital Stay: Payer: Medicare Other

## 2019-09-18 ENCOUNTER — Inpatient Hospital Stay (HOSPITAL_BASED_OUTPATIENT_CLINIC_OR_DEPARTMENT_OTHER): Payer: Medicare Other | Admitting: Internal Medicine

## 2019-09-18 DIAGNOSIS — R05 Cough: Secondary | ICD-10-CM

## 2019-09-18 DIAGNOSIS — F17210 Nicotine dependence, cigarettes, uncomplicated: Secondary | ICD-10-CM | POA: Diagnosis not present

## 2019-09-18 DIAGNOSIS — C9002 Multiple myeloma in relapse: Secondary | ICD-10-CM

## 2019-09-18 DIAGNOSIS — I110 Hypertensive heart disease with heart failure: Secondary | ICD-10-CM | POA: Diagnosis not present

## 2019-09-18 DIAGNOSIS — R059 Cough, unspecified: Secondary | ICD-10-CM

## 2019-09-18 DIAGNOSIS — I509 Heart failure, unspecified: Secondary | ICD-10-CM

## 2019-09-18 DIAGNOSIS — C9000 Multiple myeloma not having achieved remission: Secondary | ICD-10-CM

## 2019-09-18 DIAGNOSIS — Z9484 Stem cells transplant status: Secondary | ICD-10-CM | POA: Diagnosis not present

## 2019-09-18 DIAGNOSIS — I5030 Unspecified diastolic (congestive) heart failure: Secondary | ICD-10-CM | POA: Diagnosis not present

## 2019-09-18 DIAGNOSIS — Z79899 Other long term (current) drug therapy: Secondary | ICD-10-CM | POA: Diagnosis not present

## 2019-09-18 DIAGNOSIS — E876 Hypokalemia: Secondary | ICD-10-CM | POA: Diagnosis not present

## 2019-09-18 DIAGNOSIS — R0600 Dyspnea, unspecified: Secondary | ICD-10-CM

## 2019-09-18 LAB — BASIC METABOLIC PANEL
Anion gap: 8 (ref 5–15)
BUN: 17 mg/dL (ref 8–23)
CO2: 20 mmol/L — ABNORMAL LOW (ref 22–32)
Calcium: 9.4 mg/dL (ref 8.9–10.3)
Chloride: 112 mmol/L — ABNORMAL HIGH (ref 98–111)
Creatinine, Ser: 0.44 mg/dL (ref 0.44–1.00)
GFR calc Af Amer: >60 mL/min (ref >60)
GFR calc non Af Amer: >60 mL/min (ref >60)
Glucose, Bld: 81 mg/dL (ref 70–99)
Potassium: 3.7 mmol/L (ref 3.5–5.1)
Sodium: 140 mmol/L (ref 135–145)

## 2019-09-18 LAB — CBC WITH DIFFERENTIAL/PLATELET
Abs Immature Granulocytes: 0.03 10*3/uL (ref 0.00–0.07)
Basophils Absolute: 0 10*3/uL (ref 0.0–0.1)
Basophils Relative: 0 %
Eosinophils Absolute: 0.6 10*3/uL — ABNORMAL HIGH (ref 0.0–0.5)
Eosinophils Relative: 8 %
HCT: 34.1 % — ABNORMAL LOW (ref 36.0–46.0)
Hemoglobin: 10.4 g/dL — ABNORMAL LOW (ref 12.0–15.0)
Immature Granulocytes: 0 %
Lymphocytes Relative: 39 %
Lymphs Abs: 2.8 10*3/uL (ref 0.7–4.0)
MCH: 26.9 pg (ref 26.0–34.0)
MCHC: 30.5 g/dL (ref 30.0–36.0)
MCV: 88.3 fL (ref 80.0–100.0)
Monocytes Absolute: 0.6 10*3/uL (ref 0.1–1.0)
Monocytes Relative: 8 %
Neutro Abs: 3.1 10*3/uL (ref 1.7–7.7)
Neutrophils Relative %: 45 %
Platelets: 129 10*3/uL — ABNORMAL LOW (ref 150–400)
RBC: 3.86 MIL/uL — ABNORMAL LOW (ref 3.87–5.11)
RDW: 16.6 % — ABNORMAL HIGH (ref 11.5–15.5)
WBC: 7.1 10*3/uL (ref 4.0–10.5)
nRBC: 0 % (ref 0.0–0.2)

## 2019-09-18 LAB — BRAIN NATRIURETIC PEPTIDE: B Natriuretic Peptide: 539 pg/mL — ABNORMAL HIGH (ref 0.0–100.0)

## 2019-09-18 MED ORDER — POTASSIUM CHLORIDE CRYS ER 20 MEQ PO TBCR: 20.0000 meq | EXTENDED_RELEASE_TABLET | Freq: Two times a day (BID) | ORAL | 6 refills | Status: DC

## 2019-09-18 MED ORDER — ACYCLOVIR 400 MG PO TABS: ORAL_TABLET | ORAL | 0 refills | Status: DC

## 2019-09-18 MED ORDER — FUROSEMIDE 40 MG PO TABS: 40.0000 mg | ORAL_TABLET | Freq: Every day | ORAL | 0 refills | Status: DC

## 2019-09-18 NOTE — Progress Notes (Signed)
[facility] OFFICE PROGRESS NOTE  Patient Care Team: Center, [facility] as PCP - General ([place]) [name], [name]  Cancer Staging No matching staging information was found for the patient.   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 No morphologic support for residual myeloma disease. Negative for minimal residual disease by MM-MRD flow cytometry; HOLD REVLIMID [leg swelling]  --------------------------  # 12/12- RIGHT JUGULAR DVT-x 369mn xarelto; finished April 2020; September 2020-EGD/dysphagia; Dr. AnVicente Males# Acute renal failure [Dr.Singh; Proteinuria 1.5gm/day ]; acyclovir/Asprin ------------------------------------------------------------------------------------------------------------   DIAGNOSIS: '[ ]'  MULTIPLE MYELOMA  STAGE: III/HIGH RISK ;GOALS: CONTROL  CURRENT/MOST RECENT THERAPY-maintenance Revlimid    IgA myeloma ([place] 11/07/2017 -  Chemotherapy   The patient had palonosetron (ALOXI) injection 0.25 mg, 0.25 mg, Intravenous,  Once, 6 of 7  cycles Administration: 0.25 mg (11/21/2017), 0.25 mg (11/29/2017), 0.25 mg (12/05/2017), 0.25 mg (12/21/2017), 0.25 mg (12/28/2017), 0.25 mg (01/04/2018), 0.25 mg (01/18/2018), 0.25 mg (01/25/2018), 0.25 mg (02/01/2018), 0.25 mg (02/15/2018), 0.25 mg (02/22/2018), 0.25 mg (03/01/2018), 0.25 mg (03/15/2018), 0.25 mg (03/22/2018), 0.25 mg (03/30/2018), 0.25 mg (04/13/2018), 0.25 mg (04/20/2018), 0.25 mg (04/27/2018) cyclophosphamide (CYTOXAN) 500 mg in sodium chloride 0.9 % 250 mL chemo infusion, 540 mg, Intravenous,  Once, 6 of 7 cycles Administration: 500 mg (11/21/2017), 500 mg (11/29/2017), 500 mg (12/05/2017), 500 mg (12/28/2017), 500 mg (01/04/2018), 500 mg (01/18/2018), 500 mg (01/25/2018), 500 mg (02/01/2018), 500 mg (02/15/2018), 500 mg (02/22/2018), 500 mg (03/01/2018), 500 mg (03/15/2018), 500 mg (03/30/2018), 500 mg (04/13/2018), 500 mg (04/20/2018), 500 mg (04/27/2018) carfilzomib (KYPROLIS) 36 mg in dextrose 5 % 50 mL chemo infusion, 20 mg/m2 = 36 mg, Intravenous, Once, 6 of 7 cycles Administration: 36 mg (11/21/2017), 36 mg (11/22/2017), 60 mg (11/29/2017), 60 mg (11/30/2017), 60 mg (12/05/2017), 60 mg (12/06/2017), 60 mg (12/21/2017), 60 mg (12/22/2017), 60 mg (12/28/2017), 60 mg (12/29/2017), 60 mg (01/04/2018), 60 mg (01/05/2018), 60 mg (01/18/2018), 60 mg (01/19/2018), 60 mg (01/25/2018), 60 mg (01/27/2018), 60 mg (02/01/2018), 60 mg (02/02/2018), 60 mg (02/15/2018), 60 mg (02/16/2018), 60 mg (02/22/2018), 60 mg (03/01/2018), 60 mg (03/02/2018), 60 mg (03/15/2018), 60 mg (03/16/2018), 60 mg (03/23/2018), 60 mg (03/30/2018), 60 mg (03/31/2018), 60 mg (04/13/2018), 60 mg (04/14/2018), 60 mg (04/20/2018), 60 mg (04/21/2018), 60 mg (04/27/2018)  for chemotherapy treatment.       INTERVAL HISTORY:  Brandi [id number].  female pleasant patient above history of relapsed multiple myeloma--currently on maintenance Revlimid.   In the interim patient [facility]  Complains of worsening swelling in the  legs.  Complains of shortness of breath with exertion.  Complains of cough.  No nausea no vomiting.  Appetite improved on Megace.  Complains of fatigue.    Review of Systems  Constitutional: Positive for malaise/fatigue. Negative for chills, diaphoresis and fever.  HENT: Negative for nosebleeds and sore throat.   Eyes: Negative for double vision.  Respiratory: Negative for hemoptysis, sputum production and shortness of breath.   Cardiovascular: Positive for leg swelling. Negative for chest pain, palpitations and orthopnea.  Gastrointestinal: Negative for abdominal pain, blood in stool, constipation, heartburn and melena.  Genitourinary: Negative for dysuria, frequency and urgency.  Musculoskeletal: Positive for back pain and myalgias.  Skin: Negative.  Negative for itching and rash.  Neurological: Negative for dizziness, tingling, focal weakness, weakness and headaches.  Endo/Heme/Allergies: Does not bruise/bleed easily.  Psychiatric/Behavioral: Negative for depression. The patient is not nervous/anxious and does not have insomnia.     PAST MEDICAL HISTORY :  Past Medical History:  Diagnosis Date  . Hypertension   . Multiple myeloma ([place])     PAST SURGICAL HISTORY :   Past Surgical History:  Procedure Laterality Date  . ESOPHAGOGASTRODUODENOSCOPY (EGD) WITH PROPOFOL N/A 03/30/2019   Procedure: ESOPHAGOGASTRODUODENOSCOPY (EGD) WITH PROPOFOL;  Surgeon: Jonathon Bellows, MD;  Location: [facility] ENDOSCOPY;  Service: Gastroenterology;  Laterality: N/A;  . IR FLUORO GUIDE PORT INSERTION RIGHT  12/16/2017    FAMILY HISTORY :   Family History  Problem Relation Age of Onset  . Pneumonia Mother   . Seizures Father     SOCIAL HISTORY:   Social History   Tobacco Use  . Smoking status: Current Some Day Smoker    Packs/day: 0.50    Types: Cigarettes  . Smokeless tobacco: Never Used  Substance Use Topics  . Alcohol use: No  . Drug use: No    ALLERGIES:  has No Known  Allergies.  MEDICATIONS:  Current Outpatient Medications  Medication Sig Dispense Refill  . acyclovir (ZOVIRAX) 400 MG tablet TAKE 1 TABLET BY MOUTH ONCE DAILY TO  PREVENT  SHINGLES 30 tablet 0  . albuterol (VENTOLIN HFA) 108 (90 Base) MCG/ACT inhaler Inhale 2 puffs into the lungs every 6 (six) hours as needed for wheezing or shortness of breath. 8 g 3  . amLODipine (NORVASC) 10 MG tablet Take 1 tablet by mouth once daily 90 tablet 2  . aspirin EC 325 MG tablet Take 81 mg by mouth daily.     . ergocalciferol (VITAMIN D2) 1.25 MG (50000 UT) capsule Take 1 capsule (50,000 Units total) by mouth once a week. 12 capsule 2  . feeding supplement, ENSURE ENLIVE, (ENSURE ENLIVE) LIQD Take 237 mLs by mouth 2 (two) times daily between meals. 60 Bottle 0  . lenalidomide (REVLIMID) 10 MG capsule Take 1 capsule (10 mg total) by mouth daily. Take for 21 days then hold for 7 days, Repeat every 28 days. 21 capsule 0  . pantoprazole (PROTONIX) 40 MG tablet Take 1 tablet (40 mg total) by mouth daily. 90 tablet 3  . polyethylene glycol (MIRALAX / GLYCOLAX) packet Take 17 g by mouth daily as needed for mild constipation. 14 each 0  . potassium chloride SA (KLOR-CON) 20 MEQ tablet Take 1 tablet (20 mEq total) by mouth 2 (two) times daily. 60 tablet 6  . furosemide (LASIX) 40 MG tablet Take 1 tablet (40 mg total) by mouth daily. 30 tablet 0  . megestrol (MEGACE ES) 625 MG/5ML suspension Take 5 mLs (625 mg total) by mouth daily. (Patient not taking: Reported on 09/18/2019) 150 mL 0  . nicotine (NICODERM CQ - DOSED IN MG/24 HOURS) 14  mg/24hr patch Place 1 patch (14 mg total) onto the skin daily. (Patient not taking: Reported on 08/22/2019) 28 patch 3  . ondansetron (ZOFRAN) 8 MG tablet One pill every 8 hours as needed for nausea/vomitting. (Patient not taking: Reported on 08/08/2019) 40 tablet 1  . prochlorperazine (COMPAZINE) 10 MG tablet Take 1 tablet (10 mg total) by mouth every 6 (six) hours as needed for nausea or  vomiting. (Patient not taking: Reported on 08/08/2019) 30 tablet 3   No current facility-administered medications for this visit.   Facility-Administered Medications Ordered in Other Visits  Medication Dose Route Frequency Provider Last Rate Last Admin  . sodium chloride flush (NS) 0.9 % injection 10 mL  10 mL Intracatheter PRN [name], MD   10 mL at 02/23/18 1400    PHYSICAL EXAMINATION: ECOG PERFORMANCE STATUS: 1 - Symptomatic but completely ambulatory  There were no vitals taken for this visit.  There were no vitals filed for this visit.  Physical Exam  Constitutional: She is oriented to person, place, and time and well-developed, well-nourished, and in no distress.  Alone. She is walking herself.  HENT:  Head: Normocephalic and atraumatic.  Poor dentition.  Eyes: Pupils are equal, round, and reactive to light.  Cardiovascular: Normal rate and regular rhythm.  Pulmonary/Chest: Effort normal and breath sounds normal. No respiratory distress. She has no wheezes.  Abdominal: Soft. Bowel sounds are normal. She exhibits no distension and no mass. There is no abdominal tenderness. There is no rebound and no guarding.  Musculoskeletal:        General: Edema present. No tenderness. Normal range of motion.     Cervical back: Normal range of motion and neck supple.  Neurological: She is alert and oriented to person, place, and time.  Skin: Skin is warm.  Psychiatric: Affect normal.   LABORATORY DATA:  I have reviewed the data as listed    Component Value Date/Time   NA 140 09/18/2019 1356   K 3.7 09/18/2019 1356   CL 112 (H) 09/18/2019 1356   CO2 20 (L) 09/18/2019 1356   GLUCOSE 81 09/18/2019 1356   BUN 17 09/18/2019 1356   CREATININE 0.44 09/18/2019 1356   CALCIUM 9.4 09/18/2019 1356   PROT 6.9 08/22/2019 1055   ALBUMIN 3.5 08/22/2019 1055   AST 26 08/22/2019 1055   ALT 19 08/22/2019 1055   ALKPHOS 236 (H) 08/22/2019 1055   BILITOT 0.6 08/22/2019 1055    GFRNONAA >60 09/18/2019 1356   GFRAA >60 09/18/2019 1356    No results found for: SPEP, UPEP  Lab Results  Component Value Date   WBC 7.1 09/18/2019   NEUTROABS 3.1 09/18/2019   HGB 10.4 (L) 09/18/2019   HCT 34.1 (L) 09/18/2019   MCV 88.3 09/18/2019   PLT 129 (L) 09/18/2019      Chemistry      Component Value Date/Time   NA 140 09/18/2019 1356   K 3.7 09/18/2019 1356   CL 112 (H) 09/18/2019 1356   CO2 20 (L) 09/18/2019 1356   BUN 17 09/18/2019 1356   CREATININE 0.44 09/18/2019 1356      Component Value Date/Time   CALCIUM 9.4 09/18/2019 1356   ALKPHOS 236 (H) 08/22/2019 1055   AST 26 08/22/2019 1055   ALT 19 08/22/2019 1055   BILITOT 0.6 08/22/2019 1055       RADIOGRAPHIC STUDIES: I have personally reviewed the radiological images as listed and agreed with the findings in the report. DG Chest 2 View  Result Date: 09/18/2019 CLINICAL DATA:  Dyspnea. History of multiple myeloma. EXAM: CHEST - 2 VIEW COMPARISON:  Chest x-ray 02/20/2019 FINDINGS: The cardiac silhouette, mediastinal and hilar contours are within normal limits and stable. There is mild tortuosity and calcification of the thoracic aorta. The lungs are clear of an acute process. No infiltrates or effusions. No worrisome pulmonary lesions. The bony thorax is intact. IMPRESSION: No acute cardiopulmonary findings. Electronically Signed   By: Marijo Sanes M.D.   On: 09/18/2019 15:49     ASSESSMENT & PLAN:  IgA myeloma ([place]) #Multiple myeloma stage III high-risk cytogenetics-status post KRD-VG PR ; status post autologousstem cell transplant on 06/15/18. FEB 2021- [facility]-  Cellular marrow (50%) with normal trilineage hematopoiesis.No morphologic support for residual myeloma disease. Negative for minimal residual disease by MM-MRD flow cytometry.  Discussed the encouraging results of the bone marrow biopsy.  # HOLD revlimid for now-leg swelling [see below]; will restart once cardiac issues/leg swelling  resolve.  #Bilateral leg swelling echo - 65%; BNP- elevated; ?  Diastolic CHF- refer to cardiology. STOP norvasc.  Start Lasix 40 mg a day check BNP.   #Hypokalemia potassium 3.0 continue p.o. potassium twice a day.  # Bone lesions/pain stable last zometa on 11/27/2017.  Holding Zometa secondary to poor dentition. HOLD sec to dental issues.   #Work note given for daughter/transportation.  # DISPOSITION: ADD BNP # cxr #  REFER TO  [facility] cardiology- Dr.Arida [re: CHF] # Follow up with 2 weeks; MD- cbc/cmp/mag;-Dr.B   Orders Placed This Encounter  Procedures  . DG Chest 2 View    Standing Status:   Future    Number of Occurrences:   1    Standing Expiration Date:   11/17/2020    Order Specific Question:   Reason for Exam (SYMPTOM  OR DIAGNOSIS REQUIRED)    Answer:   dyspnea    Order Specific Question:   Preferred imaging location?    Answer:   [facility]  . Brain natriuretic peptide    Standing Status:   Future    Number of Occurrences:   1    Standing Expiration Date:   09/17/2020  . CBC with Differential    Standing Status:   Future    Standing Expiration Date:   09/17/2020  . Comprehensive metabolic panel    Standing Status:   Future    Standing Expiration Date:   09/17/2020  . Magnesium    Standing Status:   Future    Standing Expiration Date:   09/17/2020  . Ambulatory referral to Cardiology    Referral Priority:   Routine    Referral Type:   Consultation    Referral Reason:   Specialty Services Required    Referred to Provider:   Wellington Hampshire, MD    Requested Specialty:   Cardiology    Number of Visits Requested:   1   All questions were answered. The patient knows to call the clinic with any problems, questions or concerns.      [name], MD 09/19/2019 8:49 AM

## 2019-09-18 NOTE — Assessment & Plan Note (Addendum)
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 FEB 2021- [facility]-  Cellular marrow (50%) with normal trilineage hematopoiesis.No morphologic support for residual myeloma disease. Negative for minimal residual disease by MM-MRD flow cytometry.  Discussed the encouraging results of the bone marrow biopsy.  # HOLD revlimid for now-leg swelling [see below]; will restart once cardiac issues/leg swelling resolve.  #Bilateral leg swelling echo - 65%; BNP- elevated; ?  Diastolic CHF- refer to cardiology. STOP norvasc.  Start Lasix 40 mg a day check BNP.   #Hypokalemia potassium 3.0 continue p.o. potassium twice a day.  # Bone lesions/pain stable last zometa on 11/27/2017.  Holding Zometa secondary to poor dentition. HOLD sec to dental issues.   #Work note given for daughter/transportation.  # DISPOSITION: ADD BNP # cxr #  REFER TO  [facility] cardiology- Dr.Arida [re: CHF] # Follow up with 2 weeks; MD- cbc/cmp/mag;-Dr.B

## 2019-09-20 ENCOUNTER — Other Ambulatory Visit: Payer: Self-pay

## 2019-09-20 ENCOUNTER — Ambulatory Visit
Admission: RE | Admit: 2019-09-20 | Discharge: 2019-09-20 | Disposition: A | Discharge status: Home / Self Care | Payer: Medicare Other | Source: Ambulatory Visit | Attending: Internal Medicine | Admitting: Internal Medicine

## 2019-09-20 ENCOUNTER — Ambulatory Visit: Payer: Medicare Other | Admitting: Cardiovascular Disease

## 2019-09-20 DIAGNOSIS — R0600 Dyspnea, unspecified: Secondary | ICD-10-CM

## 2019-09-20 NOTE — Progress Notes (Deleted)
No Show

## 2019-09-21 ENCOUNTER — Encounter: Payer: Self-pay | Admitting: Cardiovascular Disease

## 2019-09-24 ENCOUNTER — Telehealth: Payer: Self-pay | Admitting: *Deleted

## 2019-09-24 NOTE — Telephone Encounter (Signed)
Script sent on 2/22 per md order. Md saw patient on 2/23- patient presented to clinic with symptom of leg edema. Per md's note. Pt was instructed to hold the medication. Will contact biologics to have script place on hold.

## 2019-09-24 NOTE — Telephone Encounter (Signed)
Spoke with biologics. Explained that script was sent in the day before we evaluated the patient. Patient presented with leg edema.Script held until leg edema improves. Patient will be reevaluated on 3/9 in clinic.

## 2019-09-24 NOTE — Telephone Encounter (Signed)
Received refill for patient Revlimid, but patient informed them that doctor put his on hold due to swelling and they need return call to put prescription on hold. [place]

## 2019-09-26 ENCOUNTER — Ambulatory Visit (INDEPENDENT_AMBULATORY_CARE_PROVIDER_SITE_OTHER): Payer: Medicare Other | Admitting: Internal Medicine

## 2019-09-26 ENCOUNTER — Other Ambulatory Visit: Payer: Self-pay

## 2019-09-26 ENCOUNTER — Encounter: Payer: Self-pay | Admitting: Internal Medicine

## 2019-09-26 VITALS — BP 140/60 | HR 104 | Ht 61.0 in | Wt 126.5 lb

## 2019-09-26 DIAGNOSIS — I10 Essential (primary) hypertension: Secondary | ICD-10-CM

## 2019-09-26 DIAGNOSIS — I5031 Acute diastolic (congestive) heart failure: Secondary | ICD-10-CM | POA: Diagnosis not present

## 2019-09-26 MED ORDER — METOPROLOL SUCCINATE ER 25 MG PO TB24: 25.0000 mg | ORAL_TABLET | Freq: Every day | ORAL | 1 refills | Status: DC

## 2019-09-26 MED ORDER — AMLODIPINE BESYLATE 5 MG PO TABS: 5.0000 mg | ORAL_TABLET | Freq: Every day | ORAL | 1 refills | Status: DC

## 2019-09-26 NOTE — Progress Notes (Addendum)
New Outpatient Visit Date: 09/26/2019  Referring Provider: [name], MD [place],  [place] [id number]  Chief Complaint: Leg swelling  HPI:  Ms. Brandi [name] is a 69 y.o. female who is being seen today for the evaluation of CHF at the request of Dr. Rogue Bussing. She has a history of multiple myeloma diagnosed in 2018 status post chemotherapy and subsequent autologous stem cell transplant (05/2018) maintenance Revlimid therapy as well as hypertension.  She was seen by Dr. Rogue Bussing in late February and complained of increasing leg edema as well as dyspnea on exertion and cough.  Decision was made to hold Revlimid in the setting of leg edema.  TTE in late January showed preserved LVEF with normal diastolic function.  Pulmonary artery pressure was noted to be at least moderately elevated.  During her myeloma treatment, she was noted to have an IJ DVT and was treated for a brief period with rivaroxaban.  Ms. Brandi [name] reports that leg swelling began fairly abruptly about 2-3 months ago.  It resolves with leg elevation but returns within an hour or two of being up.  It is symmetric in both legs.  She denies pain and redness, though at times her legs feel "tight."  Ms. Brandi [name] denies a history of DVT in either lower extremity.  She has exertional dyspnea when walking 10 feet but denies orthopnea and and PND, as well as cheat pain and palpitations.  Ms. Brandi [name] notes intermittent brief orthostatic lightheadedness.  She has not fallen or passed out.  Ms. Brandi [name] was recently started on furosemide 40 mg daily by Dr. Rogue Bussing and feels like this has improved her leg edema a little.  She has put on ~10 pounds over the last 2 months, though she is unsure if this is due to improved appetite or fluid retention.  Ms. Brandi [name] denies history of prior heart disease.  Serial echos since her multiple myeloma diagnosis have shown normal LVEF.  She does not recall any precipitant for edema and dyspnea  that began 2-3 months ago, though Ms. Brandi [name] notes that she was diagnosed with COPD in the last few months and continues to smoke ~3 cigarettes per day.  --------------------------------------------------------------------------------------------------  Cardiovascular History & Procedures: Cardiovascular Problems:  Pulmonary hypertension by echo (moderate)  Edema  Dyspnea on exertion  Risk Factors:  Hypertension and age > 11  Cath/PCI:  None  CV Surgery:  None  EP Procedures and Devices:  None  Non-Invasive Evaluation(s):  TTE (08/20/19): Normal LV size and wall thickness with LVEF 60-65% with normal wall motion.  Normal RV size and function.  Normal atrial size.  Trivial MR.  Moderate pulmonary hypertension.  Recent CV Pertinent Labs: Lab Results  Component Value Date   INR 0.92 03/07/2018   BNP 539.0 (H) 09/18/2019   K 3.7 09/18/2019   MG 1.5 (L) 08/08/2019   BUN 17 09/18/2019   CREATININE 0.44 09/18/2019    --------------------------------------------------------------------------------------------------  Past Medical History:  Diagnosis Date  . Hypertension   . Multiple myeloma [facility])     Past Surgical History:  Procedure Laterality Date  . ESOPHAGOGASTRODUODENOSCOPY (EGD) WITH PROPOFOL N/A 03/30/2019   Procedure: ESOPHAGOGASTRODUODENOSCOPY (EGD) WITH PROPOFOL;  Surgeon: Jonathon Bellows, MD;  Location: [facility] ENDOSCOPY;  Service: Gastroenterology;  Laterality: N/A;  . IR FLUORO GUIDE PORT INSERTION RIGHT  12/16/2017    No outpatient medications have been marked as taking for the 09/26/19 encounter (Office Visit) with Niani Mourer, Harrell Gave, MD.    Allergies: Patient has no known allergies.  Social History   Tobacco Use  . Smoking status: Current Some Day Smoker    Packs/day: 0.50    Types: Cigarettes  . Smokeless tobacco: Never Used  Substance Use Topics  . Alcohol use: No  . Drug use: No    Family History  Problem Relation Age of Onset  . Pneumonia  Mother   . Seizures Father     Review of Systems: A 12-system review of systems was performed and was negative except as noted in the HPI.  --------------------------------------------------------------------------------------------------  Physical Exam: BP 140/60 (BP Location: Right Arm, Patient Position: Sitting, Cuff Size: Normal)   Pulse (!) 104   Ht '5\' 1"'  (1.549 m)   Wt 126 lb 8 oz (57.4 kg)   SpO2 99%   BMI 23.90 kg/m   General:  NAD. HEENT: No conjunctival pallor or scleral icterus. Facemask in place. Neck: Supple without lymphadenopathy, thyromegaly, JVD, or HJR. No carotid bruit. Lungs: Normal work of breathing. Clear to auscultation bilaterally without wheezes or crackles. Heart: Tachycardic but regular without murmurs, rubs, or gallops. Right parasternal heave noted. Abd: Bowel sounds present. Soft, NT/ND without hepatosplenomegaly Ext: 2-3+ edema in both calves to just below the knees notes.  2+ radial and pedal pulses. Skin: Warm and dry without rash. Neuro: CNIII-XII intact. Strength and fine-touch sensation intact in upper and lower extremities bilaterally. Psych: Normal mood and affect.  EKG:  Sinus tachycardia (HR 104 bpm) with isolated PVC and borderline LVH.  Lab Results  Component Value Date   WBC 7.1 09/18/2019   HGB 10.4 (L) 09/18/2019   HCT 34.1 (L) 09/18/2019   MCV 88.3 09/18/2019   PLT 129 (L) 09/18/2019    Lab Results  Component Value Date   NA 140 09/18/2019   K 3.7 09/18/2019   CL 112 (H) 09/18/2019   CO2 20 (L) 09/18/2019   BUN 17 09/18/2019   CREATININE 0.44 09/18/2019   GLUCOSE 81 09/18/2019   ALT 19 08/22/2019    No results found for: CHOL, HDL, LDLCALC, LDLDIRECT, TRIG, CHOLHDL   --------------------------------------------------------------------------------------------------  ASSESSMENT AND PLAN: Acute HFpEF: Ms. Brandi [name] reports 2-3 months of worsening dependent leg edema and exertional dyspnea consistent with NYHA class  IIIb heart failure.  She has also gained 11 pounds since late January, though she is uncertain if improvement in her appetite could be contributing. I have personally reviewed her echocardiogram from 07/2019, which is notable for preserved LV systolic and diastolic function. Her RV appears mildly dilated with at least moderate pulmonary hypertension and significant elevated CVP.  Labs over the last 6 weeks are notable for elevated BNP and alkaline phosphatase.  These findings are nonspecific but could be seen with right heat failure and congestive hepatopathy.  Given that Ms. Brandi [name] has already noticed some improvement with recently added furosemide, I recommend continuing furosemide 40 mg daily.  We will also decrease amlodipine to 5 mg daily and start metoprolol succinate 25 mg daily.  If symptoms do not improve significantly, we will need to consider moving forward with right and left heart catheterization to better understand her hemodynamics.  I think it is reasonable to continue holding Revlimid for the time being.  We may need to consider workup for chronic PE if pulmonary hypertension is confirmed.  Hypertension: Blood pressure is mildly elevated today.  As above, we will decrease amlodipine to 5 mg daily and add metoprolol succinate 25 mg daily  Follow-up: Return to clinic in 2 weeks.  Nelva Bush, MD 09/27/2019 11:23 AM

## 2019-09-26 NOTE — Patient Instructions (Signed)
Medication Instructions:  Your physician has recommended you make the following change in your medication:  1. Decrease Amlodipine to 5 mg take one tablet by mouth daily. 2. START metoprolol succinate 25 mg take one tablet by mouth daily.  *If you need a refill on your cardiac medications before your next appointment, please call your pharmacy*   Lab Work: None If you have labs (blood work) drawn today and your tests are completely normal, you will receive your results only by: [name] MyChart Message (if you have MyChart) OR . A paper copy in the mail If you have any lab test that is abnormal or we need to change your treatment, we will call you to review the results.   Testing/Procedures: None   Follow-Up: At [facility], you and your health needs are our priority.  As part of our continuing mission to provide you with exceptional heart care, we have created designated Provider Care Teams.  These Care Teams include your primary Cardiologist (physician) and Advanced Practice Providers (APPs -  Physician Assistants and Nurse Practitioners) who all work together to provide you with the care you need, when you need it.  We recommend signing up for the patient portal called "MyChart".  Sign up information is provided on this After Visit Summary.  MyChart is used to connect with patients for Virtual Visits (Telemedicine).  Patients are able to view lab/test results, encounter notes, upcoming appointments, etc.  Non-urgent messages can be sent to your provider as well.   To learn more about what you can do with MyChart, go to [email or web address].    Your next appointment:   2 week(s)  The format for your next appointment:   In Person  Provider:   Nelva Bush, MD

## 2019-09-27 ENCOUNTER — Telehealth: Payer: Self-pay

## 2019-09-27 NOTE — Telephone Encounter (Signed)
Nutrition  Patient was on RD's schedule for phone appointment on 09/06/19.  RD called and left message with call back number.  RD has not heard back from patient.  Tried to call patient again today.  No answer or option to leave voicemail due to mailbox being full.    Please add patient back to RD's schedule if services needed.    Rose Hegner B. Zenia Resides, [place], [place] Registered Dietitian [phone] (pager)

## 2019-10-01 ENCOUNTER — Inpatient Hospital Stay: Payer: Medicare Other | Attending: Internal Medicine

## 2019-10-01 ENCOUNTER — Other Ambulatory Visit: Payer: Self-pay

## 2019-10-01 DIAGNOSIS — E876 Hypokalemia: Secondary | ICD-10-CM | POA: Insufficient documentation

## 2019-10-01 DIAGNOSIS — Z79899 Other long term (current) drug therapy: Secondary | ICD-10-CM | POA: Insufficient documentation

## 2019-10-01 DIAGNOSIS — F17210 Nicotine dependence, cigarettes, uncomplicated: Secondary | ICD-10-CM | POA: Insufficient documentation

## 2019-10-01 DIAGNOSIS — I110 Hypertensive heart disease with heart failure: Secondary | ICD-10-CM | POA: Insufficient documentation

## 2019-10-01 DIAGNOSIS — J449 Chronic obstructive pulmonary disease, unspecified: Secondary | ICD-10-CM | POA: Insufficient documentation

## 2019-10-01 DIAGNOSIS — G4700 Insomnia, unspecified: Secondary | ICD-10-CM | POA: Insufficient documentation

## 2019-10-01 DIAGNOSIS — C9002 Multiple myeloma in relapse: Secondary | ICD-10-CM | POA: Insufficient documentation

## 2019-10-01 DIAGNOSIS — I5030 Unspecified diastolic (congestive) heart failure: Secondary | ICD-10-CM | POA: Insufficient documentation

## 2019-10-01 NOTE — Progress Notes (Signed)
Patient reports runny nose and mild shortness of breath. Pt denies any GI symptoms or fevers. Ok to be seen in clinic tomorrow.

## 2019-10-01 NOTE — Progress Notes (Signed)
Nutrition Assessment:  69 year old female with multiple myeloma. Past medical history stem cell transplant in 05/2018, HTN.  Revlimid is currently on hold.  Noted seen by cardiology.  Spoke with patient via phone.  Patient reports appetite is up and down.  Reports that while taking megace appetite was increased.  Currently does not have anymore to take.  Reports shortness of breath and noted has seen cardiology recently.  Reports that she typically does not eat much breakfast maybe coffee and egg.  Lunch is usually a sandwich although sometimes bread is difficulty for her to swallow (esophageal stretching done). Last night she ate chicken wings and mashed potatoes and gravy.  Drinks oral nutrition supplements.     Medications: reviewed  Labs: reviewed  Anthropometrics:   Height: 61 inches Weight: 126 lb on 3/3 at cardiology 115 lb on 1/27 130 lb on 06/08/2019 124 lb today at home ?? Leg swelling or weight gain from increased appetite on megace BMI: 21   Estimated Energy Needs  Kcals: 1560-1800 Protein: 78-90 g Fluid: per MD with swelling  NUTRITION DIAGNOSIS: Inadequate oral intake related to decreased appetite, shortness of breath as evidenced by weight loss recently   INTERVENTION:  Encouraged small frequent meals, easy to prepare with shortness of breath.   Encouraged 350 calorie shakes and will provide samples for patient to try. Pick up on 3/9 at next appointment.   Provided patient contact information    MONITORING, EVALUATION, GOAL: Patient will consume adequate calories and protein to prevent weight loss   NEXT VISIT: April 12th phone f/u  Bryce Cheever B. Zenia Resides, [place], [place] Registered Dietitian [phone] (pager)

## 2019-10-02 ENCOUNTER — Encounter: Payer: Self-pay | Admitting: Licensed Clinical Social Worker

## 2019-10-02 ENCOUNTER — Inpatient Hospital Stay: Payer: Medicare Other

## 2019-10-02 ENCOUNTER — Other Ambulatory Visit: Payer: Self-pay

## 2019-10-02 ENCOUNTER — Inpatient Hospital Stay (HOSPITAL_BASED_OUTPATIENT_CLINIC_OR_DEPARTMENT_OTHER): Payer: Medicare Other | Admitting: Internal Medicine

## 2019-10-02 ENCOUNTER — Encounter: Payer: Self-pay | Admitting: Internal Medicine

## 2019-10-02 DIAGNOSIS — C9000 Multiple myeloma not having achieved remission: Secondary | ICD-10-CM

## 2019-10-02 DIAGNOSIS — I509 Heart failure, unspecified: Secondary | ICD-10-CM

## 2019-10-02 DIAGNOSIS — R059 Cough, unspecified: Secondary | ICD-10-CM

## 2019-10-02 DIAGNOSIS — C9002 Multiple myeloma in relapse: Secondary | ICD-10-CM

## 2019-10-02 DIAGNOSIS — F17210 Nicotine dependence, cigarettes, uncomplicated: Secondary | ICD-10-CM | POA: Diagnosis not present

## 2019-10-02 DIAGNOSIS — I5030 Unspecified diastolic (congestive) heart failure: Secondary | ICD-10-CM | POA: Diagnosis not present

## 2019-10-02 DIAGNOSIS — E876 Hypokalemia: Secondary | ICD-10-CM | POA: Diagnosis not present

## 2019-10-02 DIAGNOSIS — G4700 Insomnia, unspecified: Secondary | ICD-10-CM | POA: Diagnosis not present

## 2019-10-02 DIAGNOSIS — R05 Cough: Secondary | ICD-10-CM

## 2019-10-02 DIAGNOSIS — J449 Chronic obstructive pulmonary disease, unspecified: Secondary | ICD-10-CM | POA: Diagnosis not present

## 2019-10-02 DIAGNOSIS — Z79899 Other long term (current) drug therapy: Secondary | ICD-10-CM | POA: Diagnosis not present

## 2019-10-02 DIAGNOSIS — I110 Hypertensive heart disease with heart failure: Secondary | ICD-10-CM | POA: Diagnosis not present

## 2019-10-02 LAB — COMPREHENSIVE METABOLIC PANEL
ALT: 34 U/L (ref 0–44)
AST: 44 U/L — ABNORMAL HIGH (ref 15–41)
Albumin: 3.6 g/dL (ref 3.5–5.0)
Alkaline Phosphatase: 220 U/L — ABNORMAL HIGH (ref 38–126)
Anion gap: 10 (ref 5–15)
BUN: 22 mg/dL (ref 8–23)
CO2: 17 mmol/L — ABNORMAL LOW (ref 22–32)
Calcium: 9.2 mg/dL (ref 8.9–10.3)
Chloride: 109 mmol/L (ref 98–111)
Creatinine, Ser: 0.36 mg/dL — ABNORMAL LOW (ref 0.44–1.00)
GFR calc Af Amer: >60 mL/min (ref >60)
GFR calc non Af Amer: >60 mL/min (ref >60)
Glucose, Bld: 100 mg/dL — ABNORMAL HIGH (ref 70–99)
Potassium: 3.2 mmol/L — ABNORMAL LOW (ref 3.5–5.1)
Sodium: 136 mmol/L (ref 135–145)
Total Bilirubin: 0.5 mg/dL (ref 0.3–1.2)
Total Protein: 6.9 g/dL (ref 6.5–8.1)

## 2019-10-02 LAB — CBC WITH DIFFERENTIAL/PLATELET
Abs Immature Granulocytes: 0.01 10*3/uL (ref 0.00–0.07)
Basophils Absolute: 0 10*3/uL (ref 0.0–0.1)
Basophils Relative: 0 %
Eosinophils Absolute: 0.1 10*3/uL (ref 0.0–0.5)
Eosinophils Relative: 3 %
HCT: 36.1 % (ref 36.0–46.0)
Hemoglobin: 11 g/dL — ABNORMAL LOW (ref 12.0–15.0)
Immature Granulocytes: 0 %
Lymphocytes Relative: 61 %
Lymphs Abs: 3 10*3/uL (ref 0.7–4.0)
MCH: 26 pg (ref 26.0–34.0)
MCHC: 30.5 g/dL (ref 30.0–36.0)
MCV: 85.3 fL (ref 80.0–100.0)
Monocytes Absolute: 0.5 10*3/uL (ref 0.1–1.0)
Monocytes Relative: 10 %
Neutro Abs: 1.3 10*3/uL — ABNORMAL LOW (ref 1.7–7.7)
Neutrophils Relative %: 26 %
Platelets: 114 10*3/uL — ABNORMAL LOW (ref 150–400)
RBC: 4.23 MIL/uL (ref 3.87–5.11)
RDW: 15.1 % (ref 11.5–15.5)
WBC: 5 10*3/uL (ref 4.0–10.5)
nRBC: 0 % (ref 0.0–0.2)

## 2019-10-02 LAB — MAGNESIUM: Magnesium: 1.5 mg/dL — ABNORMAL LOW (ref 1.7–2.4)

## 2019-10-02 MED ORDER — FLUTICASONE-SALMETEROL 500-50 MCG/DOSE IN AEPB: 1.0000 | INHALATION_SPRAY | Freq: Two times a day (BID) | RESPIRATORY_TRACT | 3 refills | Status: DC

## 2019-10-02 MED ORDER — ERGOCALCIFEROL 1.25 MG (50000 UT) PO CAPS: 50000.0000 [IU] | ORAL_CAPSULE | ORAL | 2 refills | Status: DC

## 2019-10-02 NOTE — Assessment & Plan Note (Addendum)
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 FEB 2021- [facility]- Negative.   # CONTINUE TO HOLD revlimid for now-leg swelling [see below].   #Worsening shortness of breath -DIASTOLIC HEART FAILURE-  Continue lasix; metoprolol; reviewed/ appreciate the cardiology recommendations. ? COPD- recommend Advair in addition to continued albuterol.  Will discuss with cardiology regarding possible evaluation for cardiac amyloidosis.  #Insomnia-unclear etiology recommend melatonin 10 mg at night.  # Hypokalemia potassium 3.2;  continue p.o. potassium twice a day.  # Bone lesions/pain stable last zometa on 11/27/2017.  Holding Zometa secondary to poor dentition. HOLD sec to dental issues.   # DISPOSITION: ADD BNP  # Follow up with 3 weeks; MD- cbc/cmp/mag;-Dr.B

## 2019-10-02 NOTE — Progress Notes (Signed)
[name]  Patient Care Team: Center, [facility] as PCP - General ([place]) Brandi Lewandowsky, [name] Brandi Sickle, MD as Consulting Physician (Hematology and Oncology)  Cancer Staging No matching staging information was found for the patient.   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 No morphologic support for residual myeloma disease. Negative for minimal residual disease by [facility]-MR[facility] flow cytometry; HOLD REVLIMID [leg swelling]  # MARCH 2021-diastolic congestive heart failure [Dr.End]  --------------------------  # 12/12- RIGHT JUGULAR DVT-x 366mn xarelto; finished April 2020; September 2020-EGD/dysphagia; Dr. AnVicente [name]# Acute renal failure [Dr.Singh; Proteinuria 1.5gm/day ]; acyclovir/Asprin ------------------------------------------------------------------------------------------------------------   DIAGNOSIS: '[ ]'  MULTIPLE MYELOMA  STAGE: III/HIGH RISK ;GOALS: CONTROL  CURRENT/MOST RECENT THERAPY-maintenance Revlimid    IgA  myeloma ([facility] 11/07/2017 -  Chemotherapy   The patient had palonosetron (ALOXI) injection 0.25 mg, 0.25 mg, Intravenous,  Once, 6 of 7 cycles Administration: 0.25 mg (11/21/2017), 0.25 mg (11/29/2017), 0.25 mg (12/05/2017), 0.25 mg (12/21/2017), 0.25 mg (12/28/2017), 0.25 mg (01/04/2018), 0.25 mg (01/18/2018), 0.25 mg (01/25/2018), 0.25 mg (02/01/2018), 0.25 mg (02/15/2018), 0.25 mg (02/22/2018), 0.25 mg (03/01/2018), 0.25 mg (03/15/2018), 0.25 mg (03/22/2018), 0.25 mg (03/30/2018), 0.25 mg (04/13/2018), 0.25 mg (04/20/2018), 0.25 mg (04/27/2018) cyclophosphamide (CYTOXAN) 500 mg in sodium chloride 0.9 % 250 mL chemo infusion, 540 mg, Intravenous,  Once, 6 of 7 cycles Administration: 500 mg (11/21/2017), 500 mg (11/29/2017), 500 mg (12/05/2017), 500 mg (12/28/2017), 500 mg (01/04/2018), 500 mg (01/18/2018), 500 mg (01/25/2018), 500 mg (02/01/2018), 500 mg (02/15/2018), 500 mg (02/22/2018), 500 mg (03/01/2018), 500 mg (03/15/2018), 500 mg (03/30/2018), 500 mg (04/13/2018), 500 mg (04/20/2018), 500 mg (04/27/2018) carfilzomib (KYPROLIS) 36 mg in dextrose 5 % 50 mL chemo infusion, 20 mg/m2 = 36 mg, Intravenous, Once, 6 of 7 cycles Administration: 36 mg (11/21/2017), 36 mg (11/22/2017), 60 mg (11/29/2017), 60 mg (11/30/2017), 60 mg (12/05/2017), 60 mg (12/06/2017), 60 mg (12/21/2017), 60 mg (12/22/2017), 60 mg (12/28/2017), 60 mg (12/29/2017), 60 mg (01/04/2018), 60 mg (01/05/2018), 60 mg (01/18/2018), 60 mg (01/19/2018), 60 mg (01/25/2018), 60 mg (01/27/2018), 60 mg (02/01/2018), 60 mg (02/02/2018), 60 mg (02/15/2018), 60 mg (02/16/2018), 60 mg (02/22/2018), 60 mg (03/01/2018), 60 mg (03/02/2018), 60 mg (03/15/2018), 60 mg (03/16/2018), 60 mg (03/23/2018), 60 mg (03/30/2018), 60 mg (03/31/2018), 60 mg (04/13/2018), 60 mg (04/14/2018), 60 mg (04/20/2018), 60 mg (04/21/2018), 60 mg (04/27/2018)  for chemotherapy treatment.       INTERVAL HISTORY:  Brandi [name].o.  female pleasant patient above history of relapsed multiple myeloma--most recently on maintenance Revlimid is here for  follow-up.  Revlimid is on hold because of acute shortness of breath/cardiac issues for the last 2 months.  Patient was evaluated by cardiology-diagnosed  to have diastolic congestive heart failure.  Patient shortness of breath is improved on Lasix but not resolved.  She continues to complain of swelling of the legs again improved but not resolved.  She feels poorly complains of cough.  No fevers.  Review of Systems  Constitutional: Positive for malaise/fatigue. Negative for chills, diaphoresis and fever.  HENT: Negative for nosebleeds and sore throat.   Eyes: Negative for double vision.  Respiratory: Positive for cough and shortness of breath. Negative for hemoptysis and sputum production.   Cardiovascular: Positive for leg swelling. Negative for chest pain, palpitations and orthopnea.  Gastrointestinal: Negative for abdominal pain, blood in stool, constipation, heartburn and melena.  Genitourinary: Negative for dysuria, frequency and urgency.  Musculoskeletal: Positive for back pain and myalgias.  Skin: Negative.  Negative for itching and rash.  Neurological: Negative for dizziness, tingling, focal weakness, weakness and headaches.  Endo/Heme/Allergies: Does not bruise/bleed easily.  Psychiatric/Behavioral: Negative for depression. The patient has insomnia. The patient is not nervous/anxious.     PAST MEDICAL HISTORY :  Past Medical History:  Diagnosis Date  . Hypertension   . Multiple myeloma ([place])     PAST SURGICAL HISTORY :   Past Surgical History:  Procedure Laterality Date  . ESOPHAGOGASTRODUODENOSCOPY (EGD) WITH PROPOFOL N/A 03/30/2019   Procedure: ESOPHAGOGASTRODUODENOSCOPY (EGD) WITH PROPOFOL;  Surgeon: Jonathon Bellows, MD;  Location: [facility] END[facility]COPY;  Service: Gastroenterology;  Laterality: N/A;  . IR FLUORO GUIDE PORT INSERTION RIGHT  12/16/2017    FAMILY HISTORY :   Family History  Problem Relation Age of Onset  . Pneumonia Mother   . Seizures Father     SOCIAL  HISTORY:   Social History   Tobacco Use  . Smoking status: Current Some Day Smoker    Packs/day: 0.50    Types: Cigarettes  . Smokeless tobacco: Never Used  Substance Use Topics  . Alcohol use: No  . Drug use: No    ALLERGIES:  has No Known Allergies.  MEDICATIONS:  Current Outpatient Medications  Medication Sig Dispense Refill  . acyclovir (ZOVIRAX) 400 MG tablet TAKE 1 TABLET BY MOUTH ONCE DAILY TO  PREVENT  SHINGLES 30 tablet 0  . albuterol (VENTOLIN HFA) 108 (90 Base) MCG/ACT inhaler Inhale 2 puffs into the lungs every 6 (six) hours as needed for wheezing or shortness of breath. 8 g 3  . amLODipine (NORVASC) 5 MG tablet Take 1 tablet (5 mg total) by mouth daily. 30 tablet 1  . aspirin EC 325 MG tablet Take 325 mg by mouth daily.    . ergocalciferol (VITAMIN D2) 1.25 MG (50000 UT) capsule Take 1 capsule (50,000 Units total) by mouth once a week. 12 capsule 2  . feeding supplement, ENSURE ENLIVE, (ENSURE ENLIVE) LIQD Take 237 mLs by mouth 2 (two) times daily between meals. 60 Bottle 0  . furosemide (LASIX) 40 MG tablet Take 1 tablet (40 mg total) by mouth daily. 30 tablet 0  . metoprolol succinate (TOPROL XL) 25 MG 24 hr tablet Take 1 tablet (25 mg total) by mouth daily. (Patient taking differently: Take 12.5 mg by mouth daily. ) 30 tablet 1  . pantoprazole (PROTONIX) 40 MG tablet Take 1 tablet (40 mg total) by mouth daily. 90 tablet 3  . polyethylene glycol (MIRALAX / GLYCOLAX) packet Take 17 g by mouth daily as needed for mild constipation. 14 each 0  . potassium chloride SA (KLOR-CON) 20 MEQ tablet Take 1 tablet (20 mEq total) by mouth 2 (two) times daily. 60 tablet  6  . Fluticasone-Salmeterol (ADVAIR DISKUS) 500-50 MCG/DOSE AEPB Inhale 1 puff into the lungs 2 (two) times daily. 1 each 3   No current facility-administered medications for this visit.   Facility-Administered Medications Ordered in Other Visits  Medication Dose Route Frequency Provider Last Rate Last Admin  .  sodium chloride flush (NS) 0.9 % injection 10 mL  10 mL Intracatheter PRN Brandi Sickle, MD   10 mL at 02/23/18 1400    PHYSICAL EXAMINATION: ECOG PERFORMANCE STATUS: 1 - Symptomatic but completely ambulatory  BP (!) 125/59 (BP Location: Right Arm, Patient Position: Sitting, Cuff Size: Normal)   Pulse (!) 105   Temp (!) 97.3 F (36.3 C) (Tympanic)   Wt 121 lb 9.6 oz (55.2 kg)   SpO2 100%   BMI 22.98 kg/m   Filed Weights   10/01/19 1600  Weight: 121 lb 9.6 oz (55.2 kg)    Physical Exam  Constitutional: She is oriented to person, place, and time and well-developed, well-nourished, and in no distress.  Alone. She is walking herself.  HENT:  Head: Normocephalic and atraumatic.  Poor dentition.  Eyes: Pupils are equal, round, and reactive to light.  Cardiovascular: Normal rate and regular rhythm.  Pulmonary/Chest: Effort normal and breath sounds normal. No respiratory distress. She has no wheezes.  Abdominal: Soft. Bowel sounds are normal. She exhibits no distension and no mass. There is no abdominal tenderness. There is no rebound and no guarding.  Musculoskeletal:        General: Edema present. No tenderness. Normal range of motion.     Cervical back: Normal range of motion and neck supple.  Neurological: She is alert and oriented to person, place, and time.  Skin: Skin is warm.  Psychiatric: Affect normal.   LABORATORY DATA:  I have reviewed the data as listed    Component Value Date/Time   NA 136 10/02/2019 1504   K 3.2 (L) 10/02/2019 1504   CL 109 10/02/2019 1504   CO2 17 (L) 10/02/2019 1504   GLUCOSE 100 (H) 10/02/2019 1504   BUN 22 10/02/2019 1504   CREATININE 0.36 (L) 10/02/2019 1504   CALCIUM 9.2 10/02/2019 1504   PROT 6.9 10/02/2019 1504   ALBUMIN 3.6 10/02/2019 1504   AST 44 (H) 10/02/2019 1504   ALT 34 10/02/2019 1504   ALKPHOS 220 (H) 10/02/2019 1504   BILITOT 0.5 10/02/2019 1504   GFRNONAA >60 10/02/2019 1504   GFRAA >60 10/02/2019 1504     No results found for: SPEP, UPEP  Lab Results  Component Value Date   WBC 5.0 10/02/2019   NEUTROABS 1.3 (L) 10/02/2019   HGB 11.0 (L) 10/02/2019   HCT 36.1 10/02/2019   MCV 85.3 10/02/2019   PLT 114 (L) 10/02/2019      Chemistry      Component Value Date/Time   NA 136 10/02/2019 1504   K 3.2 (L) 10/02/2019 1504   CL 109 10/02/2019 1504   CO2 17 (L) 10/02/2019 1504   BUN 22 10/02/2019 1504   CREATININE 0.36 (L) 10/02/2019 1504      Component Value Date/Time   CALCIUM 9.2 10/02/2019 1504   ALKPHOS 220 (H) 10/02/2019 1504   AST 44 (H) 10/02/2019 1504   ALT 34 10/02/2019 1504   BILITOT 0.5 10/02/2019 1504       RADIOGRAPHIC STUDIES: I have personally reviewed the radiological images as listed and agreed with the findings in the report. No results found.   ASSESSMENT & PLAN:  IgA myeloma (  [place]) #Multiple myeloma stage III high-risk cytogenetics-status post KRD-VGPR ; status post autologousstem cell transplant on 06/15/18. FEB 2021- [facility]- Negative.   # CONTINUE TO HOLD revlimid for now-leg swelling [see below].   #Worsening shortness of breath -DIASTOLIC HEART FAILURE-  Continue lasix; metoprolol; reviewed/ appreciate the cardiology recommendations. ? COPD- recommend Advair in addition to continued albuterol.  Will discuss with cardiology regarding possible evaluation for cardiac amyloidosis.  #Insomnia-unclear etiology recommend melatonin 10 mg at night.  # Hypokalemia potassium 3.2;  continue p.o. potassium twice a day.  # Bone lesions/pain stable last zometa on 11/27/2017.  Holding Zometa secondary to poor dentition. HOLD sec to dental issues.   # DISPOSITION: ADD BNP  # Follow up with 3 weeks; MD- cbc/cmp/mag;-Dr.B   Orders Placed This Encounter  Procedures  . CBC with Differential    Standing Status:   Future    Standing Expiration Date:   10/01/2020  . Comprehensive metabolic panel    Standing Status:   Future    Standing Expiration Date:    10/01/2020  . Magnesium    Standing Status:   Future    Standing Expiration Date:   10/01/2020   All questions were answered. The patient knows to call the clinic with any problems, questions or concerns.      Brandi Sickle, MD 10/02/2019 4:42 PM

## 2019-10-12 ENCOUNTER — Ambulatory Visit: Payer: Medicare Other | Admitting: Family

## 2019-10-12 NOTE — Progress Notes (Deleted)
   Office Visit    Patient Name: Brandi [name] Date of Encounter: 10/12/2019  Primary Care Provider:  Center, [facility] Primary Cardiologist:  No primary care provider on file. Electrophysiologist:  None   Chief Complaint    Brandi [name] is a 69 y.o. female with a hx of *** presents today for ***   Past Medical History    Past Medical History:  Diagnosis Date  . Hypertension   . Multiple myeloma [facility])    Past Surgical History:  Procedure Laterality Date  . ESOPHAGOGASTRODUODENOSCOPY (EGD) WITH PROPOFOL N/A 03/30/2019   Procedure: ESOPHAGOGASTRODUODENOSCOPY (EGD) WITH PROPOFOL;  Surgeon: Jonathon Bellows, MD;  Location: [facility] ENDOSCOPY;  Service: Gastroenterology;  Laterality: N/A;  . IR FLUORO GUIDE PORT INSERTION RIGHT  12/16/2017    Allergies  No Known Allergies  History of Present Illness    Brandi [name] is a 69 y.o. female with a hx of HFpEF, multiple myeloma diagnosed 2018 s/p chemo and subsequent autologous stem cell transplant (05/2018) maintenance Revlimid therapy, HTN, COPD, tobacco use, moderate pulmonary hypertension last seen 09/26/19 by Dr. Saunders Revel.  Seen by Dr. Saunders Revel 09/26/19 for evaluation of CHF due to doted LE edema, DOE, cough. TTE January 2021 showed preserved LVEF with normal diastolic dyfunction. PASP noted moderately elevated. During myeloma treatment an IJ DVT was discovered and she treated briefly with rivaroxabam.  Swelling began approx 2-3 months ago. Resolved with leg elevation and is symmetric. Noted brief intermittent orthostatic lightheadedness. Dr. Saunders Revel continued her Lasix '40mg'$ , reduced Amlodipine to '5mg'$  daily, and started Metoprolol succinate '25mg'$  daily.   Seen by oncology 10/02/19. She was started on Advair in addition to Albuterol. There was question regarding whether she would benefit from evaluation for cardiac amyloidosis.   EKGs/Labs/Other Studies Reviewed:   The following studies were reviewed today: ***  EKG:  EKG is ***  ordered today.  The ekg ordered today demonstrates ***  Recent Labs: 09/18/2019: B Natriuretic Peptide 539.0 10/02/2019: ALT 34; BUN 22; Creatinine, Ser 0.36; Hemoglobin 11.0; Magnesium 1.5; Platelets 114; Potassium 3.2; Sodium 136  Recent Lipid Panel No results found for: CHOL, TRIG, HDL, CHOLHDL, VLDL, LDLCALC, LDLDIRECT  Home Medications   No outpatient medications have been marked as taking for the 10/12/19 encounter (Appointment) with Loel Dubonnet, NP.      Review of Systems    ***   ROS All other systems reviewed and are otherwise negative except as noted above.  Physical Exam    VS:  There were no vitals taken for this visit. , BMI There is no height or weight on file to calculate BMI. GEN: Well nourished, well developed, in no acute distress. HEENT: normal. Neck: Supple, no JVD, carotid bruits, or masses. Cardiac: ***RRR, no murmurs, rubs, or gallops. No clubbing, cyanosis, edema.  ***Radials/DP/PT 2+ and equal bilaterally.  Respiratory:  ***Respirations regular and unlabored, clear to auscultation bilaterally. GI: Soft, nontender, nondistended, BS + x 4. MS: No deformity or atrophy. Skin: Warm and dry, no rash. Neuro:  Strength and sensation are intact. Psych: Normal affect.  Accessory Clinical Findings    ECG personally reviewed by me today - *** - no acute changes.  Assessment & Plan    1. ***  Disposition: Follow up {follow up:15908} with ***   Loel Dubonnet, NP 10/12/2019, 1:05 PM

## 2019-10-15 ENCOUNTER — Encounter: Payer: Self-pay | Admitting: Family

## 2019-10-16 ENCOUNTER — Telehealth: Payer: Self-pay | Admitting: *Deleted

## 2019-10-16 NOTE — Telephone Encounter (Signed)
[employer] called asking if patient is still on hold with her Revlimid. [facility] Parents return their call [phone] ext [phone]

## 2019-10-17 NOTE — Telephone Encounter (Signed)
Spoke with Verdis Frederickson at [facility] [employer]. Revlimid is on hold. Patient will be revaluated on march 30'th.

## 2019-10-23 ENCOUNTER — Inpatient Hospital Stay: Payer: Medicare Other | Admitting: Internal Medicine

## 2019-10-23 ENCOUNTER — Inpatient Hospital Stay: Payer: Medicare Other

## 2019-10-23 ENCOUNTER — Other Ambulatory Visit: Payer: Self-pay

## 2019-10-23 VITALS — BP 137/72 | HR 101 | Temp 97.4°F | Wt 120.0 lb

## 2019-10-23 DIAGNOSIS — C9000 Multiple myeloma not having achieved remission: Secondary | ICD-10-CM

## 2019-10-23 DIAGNOSIS — D509 Iron deficiency anemia, unspecified: Secondary | ICD-10-CM

## 2019-10-23 DIAGNOSIS — C9002 Multiple myeloma in relapse: Secondary | ICD-10-CM | POA: Diagnosis not present

## 2019-10-23 DIAGNOSIS — G729 Myopathy, unspecified: Secondary | ICD-10-CM

## 2019-10-23 LAB — COMPREHENSIVE METABOLIC PANEL
ALT: 33 U/L (ref 0–44)
AST: 31 U/L (ref 15–41)
Albumin: 3.2 g/dL — ABNORMAL LOW (ref 3.5–5.0)
Alkaline Phosphatase: 193 U/L — ABNORMAL HIGH (ref 38–126)
Anion gap: 8 (ref 5–15)
BUN: 10 mg/dL (ref 8–23)
CO2: 24 mmol/L (ref 22–32)
Calcium: 9.2 mg/dL (ref 8.9–10.3)
Chloride: 108 mmol/L (ref 98–111)
Creatinine, Ser: 0.37 mg/dL — ABNORMAL LOW (ref 0.44–1.00)
GFR calc Af Amer: >60 mL/min (ref >60)
GFR calc non Af Amer: >60 mL/min (ref >60)
Glucose, Bld: 126 mg/dL — ABNORMAL HIGH (ref 70–99)
Potassium: 3.1 mmol/L — ABNORMAL LOW (ref 3.5–5.1)
Sodium: 140 mmol/L (ref 135–145)
Total Bilirubin: 0.5 mg/dL (ref 0.3–1.2)
Total Protein: 6.4 g/dL — ABNORMAL LOW (ref 6.5–8.1)

## 2019-10-23 LAB — CBC WITH DIFFERENTIAL/PLATELET
Abs Immature Granulocytes: 0.02 10*3/uL (ref 0.00–0.07)
Basophils Absolute: 0 10*3/uL (ref 0.0–0.1)
Basophils Relative: 0 %
Eosinophils Absolute: 0.2 10*3/uL (ref 0.0–0.5)
Eosinophils Relative: 3 %
HCT: 28.5 % — ABNORMAL LOW (ref 36.0–46.0)
Hemoglobin: 8.9 g/dL — ABNORMAL LOW (ref 12.0–15.0)
Immature Granulocytes: 0 %
Lymphocytes Relative: 53 %
Lymphs Abs: 3.4 10*3/uL (ref 0.7–4.0)
MCH: 26.3 pg (ref 26.0–34.0)
MCHC: 31.2 g/dL (ref 30.0–36.0)
MCV: 84.3 fL (ref 80.0–100.0)
Monocytes Absolute: 0.6 10*3/uL (ref 0.1–1.0)
Monocytes Relative: 9 %
Neutro Abs: 2.3 10*3/uL (ref 1.7–7.7)
Neutrophils Relative %: 35 %
Platelets: 161 10*3/uL (ref 150–400)
RBC: 3.38 MIL/uL — ABNORMAL LOW (ref 3.87–5.11)
RDW: 14.7 % (ref 11.5–15.5)
WBC: 6.6 10*3/uL (ref 4.0–10.5)
nRBC: 0 % (ref 0.0–0.2)

## 2019-10-23 LAB — LACTATE DEHYDROGENASE: LDH: 148 U/L (ref 98–192)

## 2019-10-23 LAB — MAGNESIUM: Magnesium: 1.5 mg/dL — ABNORMAL LOW (ref 1.7–2.4)

## 2019-10-23 LAB — IRON AND TIBC
Iron: 61 ug/dL (ref 28–170)
Saturation Ratios: 32 % — ABNORMAL HIGH (ref 10.4–31.8)
TIBC: 193 ug/dL — ABNORMAL LOW (ref 250–450)
UIBC: 132 ug/dL

## 2019-10-23 LAB — FERRITIN: Ferritin: 340 ng/mL — ABNORMAL HIGH (ref 11–307)

## 2019-10-23 NOTE — Progress Notes (Unsigned)
[place] OFFICE PROGRESS NOTE  Patient Care Team: Center, [facility] as PCP - General ([place]) [name], [name] [name], MD as Consulting Physician (Hematology and Oncology)  Cancer Staging No matching staging information was found for the patient.   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 No morphologic support for residual myeloma disease. Negative for minimal residual disease by [facility]-MRD flow cytometry; HOLD REVLIMID [leg swelling]  # MARCH 2021-diastolic congestive heart failure [Dr.End]  --------------------------  # 12/12- RIGHT JUGULAR DVT-x 342mn xarelto; finished April 2020; September 2020-EGD/dysphagia; Dr. AnVicente Males# Acute renal failure [Dr.Singh; Proteinuria 1.5gm/day ]; acyclovir/Asprin ------------------------------------------------------------------------------------------------------------   DIAGNOSIS: '[ ]'  MULTIPLE MYELOMA  STAGE: III/HIGH RISK ;GOALS: CONTROL  CURRENT/MOST RECENT THERAPY-maintenance Revlimid    IgA  myeloma ([place] 11/07/2017 -  Chemotherapy   The patient had palonosetron (ALOXI) injection 0.25 mg, 0.25 mg, Intravenous,  Once, 6 of 7 cycles Administration: 0.25 mg (11/21/2017), 0.25 mg (11/29/2017), 0.25 mg (12/05/2017), 0.25 mg (12/21/2017), 0.25 mg (12/28/2017), 0.25 mg (01/04/2018), 0.25 mg (01/18/2018), 0.25 mg (01/25/2018), 0.25 mg (02/01/2018), 0.25 mg (02/15/2018), 0.25 mg (02/22/2018), 0.25 mg (03/01/2018), 0.25 mg (03/15/2018), 0.25 mg (03/22/2018), 0.25 mg (03/30/2018), 0.25 mg (04/13/2018), 0.25 mg (04/20/2018), 0.25 mg (04/27/2018) cyclophosphamide (CYTOXAN) 500 mg in sodium chloride 0.9 % 250 mL chemo infusion, 540 mg, Intravenous,  Once, 6 of 7 cycles Administration: 500 mg (11/21/2017), 500 mg (11/29/2017), 500 mg (12/05/2017), 500 mg (12/28/2017), 500 mg (01/04/2018), 500 mg (01/18/2018), 500 mg (01/25/2018), 500 mg (02/01/2018), 500 mg (02/15/2018), 500 mg (02/22/2018), 500 mg (03/01/2018), 500 mg (03/15/2018), 500 mg (03/30/2018), 500 mg (04/13/2018), 500 mg (04/20/2018), 500 mg (04/27/2018) carfilzomib (KYPROLIS) 36 mg in dextrose 5 % 50 mL chemo infusion, 20 mg/m2 = 36 mg, Intravenous, Once, 6 of 7 cycles Administration: 36 mg (11/21/2017), 36 mg (11/22/2017), 60 mg (11/29/2017), 60 mg (11/30/2017), 60 mg (12/05/2017), 60 mg (12/06/2017), 60 mg (12/21/2017), 60 mg (12/22/2017), 60 mg (12/28/2017), 60 mg (12/29/2017), 60 mg (01/04/2018), 60 mg (01/05/2018), 60 mg (01/18/2018), 60 mg (01/19/2018), 60 mg (01/25/2018), 60 mg (01/27/2018), 60 mg (02/01/2018), 60 mg (02/02/2018), 60 mg (02/15/2018), 60 mg (02/16/2018), 60 mg (02/22/2018), 60 mg (03/01/2018), 60 mg (03/02/2018), 60 mg (03/15/2018), 60 mg (03/16/2018), 60 mg (03/23/2018), 60 mg (03/30/2018), 60 mg (03/31/2018), 60 mg (04/13/2018), 60 mg (04/14/2018), 60 mg (04/20/2018), 60 mg (04/21/2018), 60 mg (04/27/2018)  for chemotherapy treatment.       INTERVAL HISTORY:  Brandi TOLLIVER89.o.  female pleasant patient above history of relapsed multiple myeloma--most recently on maintenance Revlimid is here for  follow-up...   Revlimid is on hold because of acute shortness of breath/cardiac issues for the last 3 months.  ..   Patient  shortness of breath is improved on Lasix but not resolved.  She continues to complain of swelling of the legs again improved but not resolved.  She feels poorly complains of cough.  No fevers.  Review of Systems  Constitutional: Positive for malaise/fatigue. Negative for chills, diaphoresis and fever.  HENT: Negative for nosebleeds and sore throat.   Eyes: Negative for double vision.  Respiratory: Positive for cough and shortness of breath. Negative for hemoptysis and sputum production.   Cardiovascular: Positive for leg swelling. Negative for chest pain, palpitations and orthopnea.  Gastrointestinal: Negative for abdominal pain, blood in stool, constipation, heartburn and melena.  Genitourinary: Negative for dysuria, frequency and urgency.  Musculoskeletal: Positive for back pain and myalgias.  Skin: Negative.  Negative for itching and rash.  Neurological: Negative for dizziness, tingling, focal weakness, weakness and headaches.  Endo/Heme/Allergies: Does not bruise/bleed easily.  Psychiatric/Behavioral: Negative for depression. The patient has insomnia. The patient is not nervous/anxious.     PAST MEDICAL HISTORY :  Past Medical History:  Diagnosis Date  . Hypertension   . Multiple myeloma ([place])     PAST SURGICAL HISTORY :   Past Surgical History:  Procedure Laterality Date  . ESOPHAGOGASTRODUODENOSCOPY (EGD) WITH PROPOFOL N/A 03/30/2019   Procedure: ESOPHAGOGASTRODUODENOSCOPY (EGD) WITH PROPOFOL;  Surgeon: Jonathon Bellows, MD;  [facility] ENDOSCOPY;  Service: Gastroenterology;  Laterality: N/A;  . IR FLUORO GUIDE PORT INSERTION RIGHT  12/16/2017    FAMILY HISTORY :   Family History  Problem Relation Age of Onset  . Pneumonia Mother   . Seizures Father     SOCIAL HISTORY:   Social History   Tobacco Use  . Smoking status: Current Some Day Smoker     Packs/day: 0.50    Types: Cigarettes  . Smokeless tobacco: Never Used  Substance Use Topics  . Alcohol use: No  . Drug use: No    ALLERGIES:  has No Known Allergies.  MEDICATIONS:  Current Outpatient Medications  Medication Sig Dispense Refill  . acyclovir (ZOVIRAX) 400 MG tablet TAKE 1 TABLET BY MOUTH ONCE DAILY TO  PREVENT  SHINGLES 30 tablet 0  . albuterol (VENTOLIN HFA) 108 (90 Base) MCG/ACT inhaler Inhale 2 puffs into the lungs every 6 (six) hours as needed for wheezing or shortness of breath. 8 g 3  . amLODipine (NORVASC) 5 MG tablet Take 1 tablet (5 mg total) by mouth daily. 30 tablet 1  . aspirin EC 325 MG tablet Take 325 mg by mouth daily.    . ergocalciferol (VITAMIN D2) 1.25 MG (50000 UT) capsule Take 1 capsule (50,000 Units total) by mouth once a week. 12 capsule 2  . feeding supplement, ENSURE ENLIVE, (ENSURE ENLIVE) LIQD Take 237 mLs by mouth 2 (two) times daily between meals. 60 Bottle 0  . Fluticasone-Salmeterol (ADVAIR DISKUS) 500-50 MCG/DOSE AEPB Inhale 1 puff into the lungs 2 (two) times daily. 1 each 3  . furosemide (LASIX) 40 MG tablet Take 1 tablet (40 mg total) by mouth daily. 30 tablet 0  . metoprolol succinate (TOPROL XL) 25 MG 24 hr tablet Take 1 tablet (25 mg total) by mouth daily. (Patient taking differently: Take 12.5 mg by mouth daily. ) 30 tablet 1  . pantoprazole (PROTONIX) 40 MG tablet Take 1 tablet (40 mg total) by mouth daily. 90 tablet 3  . polyethylene glycol (MIRALAX / GLYCOLAX) packet Take 17 g by mouth daily as needed for mild constipation. 14 each 0  . potassium chloride SA (KLOR-CON) 20 MEQ tablet Take  1 tablet (20 mEq total) by mouth 2 (two) times daily. 60 tablet 6   No current facility-administered medications for this visit.   Facility-Administered Medications Ordered in Other Visits  Medication Dose Route Frequency Provider Last Rate Last Admin  . sodium chloride flush (NS) 0.9 % injection 10 mL  10 mL Intracatheter PRN [name], MD   10 mL at 02/23/18 1400    PHYSICAL EXAMINATION: ECOG PERFORMANCE STATUS: 1 - Symptomatic but completely ambulatory  BP 137/72 (BP Location: Left Arm, Patient Position: Sitting, Cuff Size: Normal)   Pulse (!) 101   Temp (!) 97.4 F (36.3 C) (Tympanic)   Wt 120 lb (54.4 kg)   BMI 22.67 kg/m   Filed Weights   10/23/19 1443  Weight: 120 lb (54.4 kg)    Physical Exam  Constitutional: She is oriented to person, place, and time and well-developed, well-nourished, and in no distress.  Alone. She is walking herself.  HENT:  Head: Normocephalic and atraumatic.  Poor dentition.  Eyes: Pupils are equal, round, and reactive to light.  Cardiovascular: Normal rate and regular rhythm.  Pulmonary/Chest: Effort normal and breath sounds normal. No respiratory distress. She has no wheezes.  Abdominal: Soft. Bowel sounds are normal. She exhibits no distension and no mass. There is no abdominal tenderness. There is no rebound and no guarding.  Musculoskeletal:        General: Edema present. No tenderness. Normal range of motion.     Cervical back: Normal range of motion and neck supple.  Neurological: She is alert and oriented to person, place, and time.  Skin: Skin is warm.  Psychiatric: Affect normal.   LABORATORY DATA:  I have reviewed the data as listed    Component Value Date/Time   NA 136 10/02/2019 1504   K 3.2 (L) 10/02/2019 1504   CL 109 10/02/2019 1504   CO2 17 (L) 10/02/2019 1504   GLUCOSE 100 (H) 10/02/2019 1504   BUN 22 10/02/2019 1504   CREATININE 0.36 (L) 10/02/2019 1504   CALCIUM 9.2 10/02/2019 1504   PROT 6.9 10/02/2019 1504   ALBUMIN 3.6 10/02/2019 1504   AST 44 (H) 10/02/2019 1504   ALT 34 10/02/2019 1504   ALKPHOS 220 (H) 10/02/2019 1504   BILITOT 0.5 10/02/2019 1504   GFRNONAA >60 10/02/2019 1504   GFRAA >60 10/02/2019 1504    No results found for: SPEP, UPEP  Lab Results  Component Value Date   WBC 6.6 10/23/2019   NEUTROABS 2.3  10/23/2019   HGB 8.9 (L) 10/23/2019   HCT 28.5 (L) 10/23/2019   MCV 84.3 10/23/2019   PLT 161 10/23/2019      Chemistry      Component Value Date/Time   NA 136 10/02/2019 1504   K 3.2 (L) 10/02/2019 1504   CL 109 10/02/2019 1504   CO2 17 (L) 10/02/2019 1504   BUN 22 10/02/2019 1504   CREATININE 0.36 (L) 10/02/2019 1504      Component Value Date/Time   CALCIUM 9.2 10/02/2019 1504   ALKPHOS 220 (H) 10/02/2019 1504   AST 44 (H) 10/02/2019 1504   ALT 34 10/02/2019 1504   BILITOT 0.5 10/02/2019 1504       RADIOGRAPHIC STUDIES: I have personally reviewed the radiological images as listed and agreed with the findings in the report. No results found.   ASSESSMENT & PLAN:  IgA myeloma ([place]) #Multiple myeloma stage III high-risk cytogenetics-status post KRD-VGPR ; status post autologousstem cell transplant on 06/15/18. FEB  2021- [facility]- NEGATIVE.   # CONTINUE TO HOLD revlimid for now-leg swelling [see below].   # DIASTOLIC HEART FAILURE-  Continue lasix; metoprolol; reviewed/ discussed with Dr.End- unlikley to be cardiac amyloidosis.   # Dyaphagia- with solids than liquids..   # Hypokalemia potassium 3.2;  continue p.o. potassium twice a day.  # Bone lesions/pain stable last zometa on 11/27/2017.  Holding Zometa secondary to poor dentition.HOLD.   # DISPOSITION: # Follow up with 3 weeks; MD- cbc/cmp/mag;-Dr.B   No orders of the defined types were placed in this encounter.  All questions were answered. The patient knows to call the clinic with any problems, questions or concerns.      [name], MD 10/23/2019 2:56 PM

## 2019-10-23 NOTE — Assessment & Plan Note (Addendum)
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 FEB 2021- [facility]- NEGATIVE.   # CONTINUE TO HOLD revlimid for now-leg swelling [see below].   # DIASTOLIC HEART FAILURE-  Continue lasix; metoprolol; reviewed/ discussed with Dr.End- unlikley to be cardiac amyloidosis.   # Dyaphagia- with solids than liquids..   # Hypokalemia potassium 3.1;worse; recommend e p.o. potassium twice a day.  # Bone lesions/pain stable last zometa on 11/27/2017.  Holding Zometa secondary to poor dentition. HOLD.   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  # Follow up with 3 weeks; MD- cbc/cmp/mag;-Dr.B

## 2019-10-30 ENCOUNTER — Other Ambulatory Visit: Payer: Self-pay

## 2019-10-30 ENCOUNTER — Ambulatory Visit (INDEPENDENT_AMBULATORY_CARE_PROVIDER_SITE_OTHER): Payer: Medicare Other | Admitting: Gastroenterology

## 2019-10-30 VITALS — BP 119/68 | HR 120 | Temp 97.9°F | Ht 61.0 in | Wt 120.2 lb

## 2019-10-30 DIAGNOSIS — R1310 Dysphagia, unspecified: Secondary | ICD-10-CM

## 2019-10-30 NOTE — Progress Notes (Signed)
Jonathon Bellows MD, MRCP(U.K) [address]  [place]  [place],  [address]  Main: [phone]  Fax: [phone]   Primary Care Physician: Center, [facility]  Primary Gastroenterologist:  Dr. Jonathon Bellows    Follow-up for dysphagia   HPI: Brandi [name] is a 69 y.o. female    Summary of history :  She was last seen my office in October 2020 for dysphagia.  Prior evaluation including CT scan of the neck with contrast on 03/22/2019 was normal.  She is unable to tolerate a barium with contrast.  Ongoing issue with swallowing for solids and liquids for years.  Got worse recently. Current smoker.  Incidentally noted elevated alkaline phosphatase.  Right upper quadrant ultrasound was negative.  03/30/2019: EGD: No obvious stricture noted but the balloon was passed through the esophagus and dilated to 15 mm and run through the length of the esophagus.  Biopsies showed no abnormality of the esophagus.  03/26/2019: PTH was very low .   IgA was also very elevated.   Interval history October 2020-10/30/2019  05/09/2019: Hemochromatosis gene mutation negative  She has been having difficulty swallowing and points to her throat where the food gets stuck going on for a few months.  She has still not obtained a new set of teeth and does not have many teeth in her mouth to chew her food.  Affect solids more than liquids.  Current Outpatient Medications  Medication Sig Dispense Refill  . acyclovir (ZOVIRAX) 400 MG tablet TAKE 1 TABLET BY MOUTH ONCE DAILY TO  PREVENT  SHINGLES 30 tablet 0  . albuterol (VENTOLIN HFA) 108 (90 Base) MCG/ACT inhaler Inhale 2 puffs into the lungs every 6 (six) hours as needed for wheezing or shortness of breath. 8 g 3  . amLODipine (NORVASC) 5 MG tablet Take 1 tablet (5 mg total) by mouth daily. 30 tablet 1  . aspirin EC 325 MG tablet Take 325 mg by mouth daily.    . ergocalciferol (VITAMIN D2) 1.25 MG (50000 UT) capsule Take 1 capsule (50,000  Units total) by mouth once a week. 12 capsule 2  . feeding supplement, ENSURE ENLIVE, (ENSURE ENLIVE) LIQD Take 237 mLs by mouth 2 (two) times daily between meals. 60 Bottle 0  . Fluticasone-Salmeterol (ADVAIR DISKUS) 500-50 MCG/DOSE AEPB Inhale 1 puff into the lungs 2 (two) times daily. 1 each 3  . furosemide (LASIX) 40 MG tablet Take 1 tablet (40 mg total) by mouth daily. 30 tablet 0  . metoprolol succinate (TOPROL XL) 25 MG 24 hr tablet Take 1 tablet (25 mg total) by mouth daily. (Patient taking differently: Take 12.5 mg by mouth daily. ) 30 tablet 1  . pantoprazole (PROTONIX) 40 MG tablet Take 1 tablet (40 mg total) by mouth daily. 90 tablet 3  . polyethylene glycol (MIRALAX / GLYCOLAX) packet Take 17 g by mouth daily as needed for mild constipation. 14 each 0  . potassium chloride SA (KLOR-CON) 20 MEQ tablet Take 1 tablet (20 mEq total) by mouth 2 (two) times daily. 60 tablet 6   No current facility-administered medications for this visit.   Facility-Administered Medications Ordered in Other Visits  Medication Dose Route Frequency Provider Last Rate Last Admin  . sodium chloride flush (NS) 0.9 % injection 10 mL  10 mL Intracatheter PRN [name], MD   10 mL at 02/23/18 1400    Allergies as of 10/30/2019  . (No Known Allergies)    ROS:  General: Negative for  anorexia, weight loss, fever, chills, fatigue, weakness. ENT: Negative for hoarseness, difficulty swallowing , nasal congestion. CV: Negative for chest pain, angina, palpitations, dyspnea on exertion, peripheral edema.  Respiratory: Negative for dyspnea at rest, dyspnea on exertion, cough, sputum, wheezing.  GI: See history of present illness. GU:  Negative for dysuria, hematuria, urinary incontinence, urinary frequency, nocturnal urination.  Endo: Negative for unusual weight change.    Physical Examination:   There were no vitals taken for this visit.  General: Well-nourished, well-developed in no acute  distress.  Eyes: No icterus. Conjunctivae pink. Mouth: Oropharyngeal mucosa moist and pink , no lesions erythema or exudate.  Very few teeth in the mouth. Psych: Alert and cooperative, normal mood and affect.   Assessment and Plan:   Brandi [name] is a 69 y.o. y/o female here to follow-up for dysphagia.  Prior endoscopy and dilation with no obvious stricture.  She points to her throat to tell us that the point where food gets stuck.  Ongoing for a few months.  She does not have adequate dentition to chew her food.  Most likely the problem.  To complete work-up I will obtain a modified barium swallow as well as a barium swallow with tablet to rule out oropharyngeal dysphagia  as well as esophageal dysphagia  Dr Jonathon Bellows  MD,MRCP [facility] Follow up in 6 weeks

## 2019-11-05 ENCOUNTER — Inpatient Hospital Stay: Payer: Medicare Other | Attending: Internal Medicine

## 2019-11-05 DIAGNOSIS — E876 Hypokalemia: Secondary | ICD-10-CM | POA: Insufficient documentation

## 2019-11-05 DIAGNOSIS — R0602 Shortness of breath: Secondary | ICD-10-CM | POA: Insufficient documentation

## 2019-11-05 DIAGNOSIS — Z9484 Stem cells transplant status: Secondary | ICD-10-CM | POA: Insufficient documentation

## 2019-11-05 DIAGNOSIS — C9000 Multiple myeloma not having achieved remission: Secondary | ICD-10-CM | POA: Insufficient documentation

## 2019-11-05 DIAGNOSIS — I110 Hypertensive heart disease with heart failure: Secondary | ICD-10-CM | POA: Insufficient documentation

## 2019-11-05 DIAGNOSIS — Z79899 Other long term (current) drug therapy: Secondary | ICD-10-CM | POA: Insufficient documentation

## 2019-11-05 DIAGNOSIS — F17210 Nicotine dependence, cigarettes, uncomplicated: Secondary | ICD-10-CM | POA: Insufficient documentation

## 2019-11-05 DIAGNOSIS — I5030 Unspecified diastolic (congestive) heart failure: Secondary | ICD-10-CM | POA: Insufficient documentation

## 2019-11-05 NOTE — Progress Notes (Signed)
Nutrition Follow-up:  Patient with multiple myeloma.  Patient followed by Dr. B  Spoke with patient via phone this pm for nutrition follow-up.  Patient reports that her appetite is a little bit better.  Reports that she enjoys drinking ensure/boost shakes.  Has been trying to drink 3-6 per day as sometimes solid foods are not able to go down.  Noted has seen GI recently and planning MBSS and barium swallow with tablet.  Patient reports recent EGD with dilation.  Reports yesterday was able to eat french fries and hot dog for lunch, egg, bacon and toast for breakfast and few pinto beans, cabbage, chicken and broccoli and cheese for dinner last night.  Reports was able to swallow those items yesterday without difficulty.  Has not gotten set of dentures.    Reports leg swelling has gotten better.  Medications: reviewed  Labs: reviewed  Anthropometrics:   Weight 120 lb on 4/6 decreased from 126 lb on 3/3 (?swelling at this visit)   NUTRITION DIAGNOSIS: Inadequate oral intake continues with dysphagia   INTERVENTION:  Will provide complimentary case of ensure enlive for patient to pick up during the week of 4/12.  If patient unable to tolerate solid foods can drink up to 5 ensure daily to better meet nutritional needs. Discussed ways to prepare foods easier to swallow. Patient has contact information    MONITORING, EVALUATION, GOAL: Patient will consume adequate calories and protein to prevent weight loss.   NEXT VISIT: May 10th phone f/u  Joli B. Allen, RD, LDN Registered Dietitian [phone] (pager)     

## 2019-11-08 ENCOUNTER — Ambulatory Visit: Payer: Medicare Other | Attending: Gastroenterology

## 2019-11-12 ENCOUNTER — Other Ambulatory Visit: Payer: Self-pay

## 2019-11-12 ENCOUNTER — Encounter: Payer: Self-pay | Admitting: Family

## 2019-11-12 ENCOUNTER — Ambulatory Visit (INDEPENDENT_AMBULATORY_CARE_PROVIDER_SITE_OTHER): Payer: Medicare Other | Admitting: Family

## 2019-11-12 VITALS — BP 124/60 | HR 103 | Ht 62.0 in | Wt 117.5 lb

## 2019-11-12 DIAGNOSIS — C9000 Multiple myeloma not having achieved remission: Secondary | ICD-10-CM

## 2019-11-12 DIAGNOSIS — E876 Hypokalemia: Secondary | ICD-10-CM

## 2019-11-12 DIAGNOSIS — I5032 Chronic diastolic (congestive) heart failure: Secondary | ICD-10-CM | POA: Diagnosis not present

## 2019-11-12 DIAGNOSIS — I10 Essential (primary) hypertension: Secondary | ICD-10-CM

## 2019-11-12 MED ORDER — METOPROLOL SUCCINATE ER 25 MG PO TB24: 37.5000 mg | ORAL_TABLET | Freq: Every day | ORAL | 3 refills | Status: DC

## 2019-11-12 MED ORDER — SPIRONOLACTONE 25 MG PO TABS: 12.5000 mg | ORAL_TABLET | Freq: Every day | ORAL | 3 refills | Status: DC

## 2019-11-12 MED ORDER — FUROSEMIDE 40 MG PO TABS: 40.0000 mg | ORAL_TABLET | Freq: Every day | ORAL | 3 refills | Status: DC

## 2019-11-12 NOTE — Patient Instructions (Signed)
Medication Instructions:  1- INCREASE Metoprolol Take 1.5 tablets (37.5 mg total) by mouth daily 2- START Spironolactone  Take 0.5 tablets (12.5 mg total) by mouth daily 3- START Lasix Take 1 tablet (40 mg total) by mouth daily *If you need a refill on your cardiac medications before your next appointment, please call your pharmacy*   Lab Work: Your physician recommends that you return for lab work in: 2 weeks at the medical mall. ([facility]) No appt is needed. Hours are M-F 7AM- 6 PM.  If you have labs (blood work) drawn today and your tests are completely normal, you will receive your results only by: [name] MyChart Message (if you have MyChart) OR . A paper copy in the mail If you have any lab test that is abnormal or we need to change your treatment, we will call you to review the results.   Testing/Procedures: None ordered   Follow-Up: At [facility], you and your health needs are our priority.  As part of our continuing mission to provide you with exceptional heart care, we have created designated Provider Care Teams.  These Care Teams include your primary Cardiologist (physician) and Advanced Practice Providers (APPs -  Physician Assistants and Nurse Practitioners) who all work together to provide you with the care you need, when you need it.  We recommend signing up for the patient portal called "MyChart".  Sign up information is provided on this After Visit Summary.  MyChart is used to connect with patients for Virtual Visits (Telemedicine).  Patients are able to view lab/test results, encounter notes, upcoming appointments, etc.  Non-urgent messages can be sent to your provider as well.   To learn more about what you can do with MyChart, go to [email or web address].    Your next appointment:   4 week(s)  The format for your next appointment:   In Person  Provider:    You may see Dr. Saunders Revel or Laurann Montana, NP  Other Instructions

## 2019-11-12 NOTE — Progress Notes (Signed)
Office Visit    Patient Name: Brandi [name] Date of Encounter: 11/12/2019  Primary Care Provider:  Center, [facility] Primary Cardiologist:  No primary care provider on file. Electrophysiologist:  None   Chief Complaint    Brandi [name] is a 69 y.o. female with a hx of multiple myeloma diagnosed in 2018 s/p chemo and autologous stem cell transplant (05/2018) maintenance Revlimid therapy, HTN, LE edema, DOE presents today for follow up of HFpEF.   Past Medical History    Past Medical History:  Diagnosis Date  . Hypertension   . Multiple myeloma [facility])    Past Surgical History:  Procedure Laterality Date  . ESOPHAGOGASTRODUODENOSCOPY (EGD) WITH PROPOFOL N/A 03/30/2019   Procedure: ESOPHAGOGASTRODUODENOSCOPY (EGD) WITH PROPOFOL;  Surgeon: Jonathon Bellows, MD;  Location: [facility] ENDOSCOPY;  Service: Gastroenterology;  Laterality: N/A;  . IR FLUORO GUIDE PORT INSERTION RIGHT  12/16/2017    Allergies  No Known Allergies  History of Present Illness    Brandi [name] is a 69 y.o. female with a hx of multiple myeloma diagnosed in 2018 s/p chemo and autologous stem cell transplant (05/2018) maintenance Revlimid therapy, HTN, LE edema, DOE, COPD, tobacco use  last seen by Dr. Saunders Revel 09/26/19.  Seen by Dr. Rogue Bussing in late February nothing LE edema, DOE, and cough. Her Revlimid was held in setting of LE edema. TTE late January with preserved LVEF and normal diastolic function. However, PASP noted to be at least moderately elevated. During previous myeloma treatment, had IJ DVT treated briefly with Xarelto.   When seen by Dr. Saunders Revel 09/26/19 reported 2-3 months of worsening LE edema and DOE consistently with NYHAIIIb heart failure. She had elevated BNP. Her Furosemide which was previously started by Dr. Rogue Bussing was increased, Amlodipine decreased, and Metoprolol added.   Very pleasant lady who reports feeling much better after her visit with Dr. Saunders Revel.  Tells me her lower  extremity edema has improved.  Still notices edema at the end of the day that is better by morning.  Does try to sit with her feet up but spends most of the day on her feet around the house.  She wears compression stockings very intermittently.  Reports her dyspnea on exertion is still present but is not occurring as often.  She does not add salt to her food but does endorse eating out at restaurants like [facility] and [employer].  We discussed that this food has a a lot of sodium and recommended requesting food items without salt.  She reports no chest pain, pressure, tightness.  She reports no orthopnea nor PND.  She reports intermittent sensation of her "heart racing ".  Tells me just feels like her heart beats fast and then it self resolves.  Not associated with pain or shortness of breath.  We reviewed her tachycardia.  EKGs/Labs/Other Studies Reviewed:   The following studies were reviewed today:  Echo 08/20/19 1. Left ventricular ejection fraction, by visual estimation, is 60 to  65%. The left ventricle has normal function. There is no left ventricular  hypertrophy.   2. The left ventricle has no regional wall motion abnormalities.   3. Global right ventricle has normal systolic function.The right  ventricular size is normal. No increase in right ventricular wall  thickness.   4. Left atrial size was normal.   5. Right atrial size was normal.   6. The mitral valve is normal in structure. Trivial mitral valve  regurgitation.   7. The tricuspid valve  is normal in structure.   8. The tricuspid valve is normal in structure. Tricuspid valve  regurgitation is trivial.   9. The aortic valve is normal in structure. Aortic valve regurgitation is  trivial.  10. The pulmonic valve was normal in structure. Pulmonic valve  regurgitation is not visualized.  11. Moderately elevated pulmonary artery systolic pressure.   EKG:  EKG is ordered today.  The ekg ordered today demonstrates ST 103 bpm with no  acute ST/T wave changes.  Recent Labs: 09/18/2019: B Natriuretic Peptide 539.0 10/23/2019: ALT 33; BUN 10; Creatinine, Ser 0.37; Hemoglobin 8.9; Magnesium 1.5; Platelets 161; Potassium 3.1; Sodium 140  Recent Lipid Panel No results found for: CHOL, TRIG, HDL, CHOLHDL, VLDL, LDLCALC, LDLDIRECT  Home Medications   Current Meds  Medication Sig  . acyclovir (ZOVIRAX) 400 MG tablet TAKE 1 TABLET BY MOUTH ONCE DAILY TO  PREVENT  SHINGLES  . albuterol (VENTOLIN HFA) 108 (90 Base) MCG/ACT inhaler Inhale 2 puffs into the lungs every 6 (six) hours as needed for wheezing or shortness of breath.  [name] amLODipine (NORVASC) 5 MG tablet Take 1 tablet (5 mg total) by mouth daily.  [name] aspirin EC 325 MG tablet Take 325 mg by mouth daily.  . ergocalciferol (VITAMIN D2) 1.25 MG (50000 UT) capsule Take 1 capsule (50,000 Units total) by mouth once a week.  . feeding supplement, ENSURE ENLIVE, (ENSURE ENLIVE) LIQD Take 237 mLs by mouth 2 (two) times daily between meals.  . Fluticasone-Salmeterol (ADVAIR DISKUS) 500-50 MCG/DOSE AEPB Inhale 1 puff into the lungs 2 (two) times daily.  . furosemide (LASIX) 40 MG tablet Take 1 tablet (40 mg total) by mouth daily.  . metoprolol succinate (TOPROL XL) 25 MG 24 hr tablet Take 1.5 tablets (37.5 mg total) by mouth daily.  . pantoprazole (PROTONIX) 40 MG tablet Take 1 tablet (40 mg total) by mouth daily.  . polyethylene glycol (MIRALAX / GLYCOLAX) packet Take 17 g by mouth daily as needed for mild constipation.  . potassium chloride SA (KLOR-CON) 20 MEQ tablet Take 1 tablet (20 mEq total) by mouth 2 (two) times daily.  . [DISCONTINUED] furosemide (LASIX) 40 MG tablet Take 1 tablet (40 mg total) by mouth daily.  . [DISCONTINUED] metoprolol succinate (TOPROL XL) 25 MG 24 hr tablet Take 1 tablet (25 mg total) by mouth daily. (Patient taking differently: Take 12.5 mg by mouth daily. )    Review of Systems      Review of Systems  Constitution: Negative for chills, fever and  malaise/fatigue.  Cardiovascular: Positive for dyspnea on exertion and palpitations. Negative for chest pain, irregular heartbeat, leg swelling, near-syncope, orthopnea and syncope.  Respiratory: Negative for cough, shortness of breath and wheezing.   Gastrointestinal: Negative for melena, nausea and vomiting.  Genitourinary: Negative for hematuria.  Neurological: Negative for dizziness, light-headedness and weakness.   All other systems reviewed and are otherwise negative except as noted above.  Physical Exam    VS:  BP 124/60 (BP Location: Left Arm, Patient Position: Sitting, Cuff Size: Normal)   Pulse (!) 103   Ht '5\' 2"'  (1.575 m)   Wt 117 lb 8 oz (53.3 kg)   SpO2 98%   BMI 21.49 kg/m  , BMI Body mass index is 21.49 kg/m. GEN: Well nourished, well developed, in no acute distress. HEENT: normal. Neck: Supple, no JVD, carotid bruits, or masses. Cardiac: tachycardic, RRR, no murmurs, rubs, or gallops. No clubbing, cyanosis. 1+ bilateral LE edema.  Radials//PT 2+ and equal bilaterally.  Respiratory:  Respirations regular and unlabored, clear to auscultation bilaterally. GI: Soft, nontender, nondistended, BS + x 4. MS: No deformity or atrophy. Skin: Warm and dry, no rash. Neuro:  Strength and sensation are intact. Psych: Normal affect.  Accessory Clinical Findings    ECG personally reviewed by me today -sinus tachycardia 103 bpm with no acute ST/T wave changes- no acute changes.  Assessment & Plan    1. HFpEF/Hypokalemia - Echo 08/20/2019 with LVEF 60-65%.  Reports improvement in her symptoms since addition of metoprolol and reduction of amlodipine at last visit.  1+ lower extremity edema noted on exam.  She reports DOE only with more than usual activity, NYHA II.    Lifestyle changes: Does not salt her food but does eat out, we discussed low-sodium diet.  Continues to drink Pepsi/Coke-recommended she switch to Sprite or another clear soda for reduction in sodium intake.  Encouraged  to elevate lower extremities when sitting.  Encouraged regular wearing of compression stockings during daytime hours.  GDMT includes beta blocker and loop diuretic. Increase Metoprolol to 37.62m BID for tachycardia.   BMP tomorrow with Dr. BRogue Bussing I will review. Anticipate her anemia is contributory to her HFpEF symptoms, continue to follow with Dr. BRogue Bussing   Continue Lasix 450mdaily, refill provided. Start Spironolactone 12.63m51maily. Recurrent hypokalemia (10/23/19 K 3.1) with Kdur 39m50maily per Dr. BrahRogue Bussingpeful addition of Spironolactone will reduce her edema and normalize her potassium, BMP in 2 weeks to reassess.  2. HTN - Blood pressure well controlled.  Does not check routinely at home but does have a cuff and is agreeable to check twice per week.  No symptoms of hypotension. Continue amlodipine 5 mg daily-cannot exclude that this is contributory to her lower extremity edema, could consider trial of discontinuation in the future.  In the interim, we will increase metoprolol to 37.5 mg twice daily due to tachycardia.  Add spironolactone 12.5 mg daily, as above.  3. Multiple myeloma - Follows with Dr. BrahRogue Bussingoncology.  Upcoming appointment tomorrow with labs.    4. Palpitations - Reports feeling her "heart racing" on occasion.  Not associated with chest pain or shortness of breath.  Likely sinus tachycardia as present on today's EKG.  Increase metoprolol, as above.  Disposition: BMP in 2 weeks.  Follow up in 4 week(s) with Dr. End Saunders RevelAPP   CaitLoel Dubonnet 11/12/2019, 3:06 PM

## 2019-11-13 ENCOUNTER — Encounter: Payer: Self-pay | Admitting: Internal Medicine

## 2019-11-13 ENCOUNTER — Inpatient Hospital Stay: Payer: Medicare Other

## 2019-11-13 ENCOUNTER — Inpatient Hospital Stay (HOSPITAL_BASED_OUTPATIENT_CLINIC_OR_DEPARTMENT_OTHER): Payer: Medicare Other | Admitting: Internal Medicine

## 2019-11-13 DIAGNOSIS — Z79899 Other long term (current) drug therapy: Secondary | ICD-10-CM | POA: Diagnosis not present

## 2019-11-13 DIAGNOSIS — I5030 Unspecified diastolic (congestive) heart failure: Secondary | ICD-10-CM | POA: Diagnosis not present

## 2019-11-13 DIAGNOSIS — C9000 Multiple myeloma not having achieved remission: Secondary | ICD-10-CM

## 2019-11-13 DIAGNOSIS — D509 Iron deficiency anemia, unspecified: Secondary | ICD-10-CM

## 2019-11-13 DIAGNOSIS — Z9484 Stem cells transplant status: Secondary | ICD-10-CM | POA: Diagnosis not present

## 2019-11-13 DIAGNOSIS — F17210 Nicotine dependence, cigarettes, uncomplicated: Secondary | ICD-10-CM | POA: Diagnosis not present

## 2019-11-13 DIAGNOSIS — I110 Hypertensive heart disease with heart failure: Secondary | ICD-10-CM | POA: Diagnosis not present

## 2019-11-13 DIAGNOSIS — R0602 Shortness of breath: Secondary | ICD-10-CM | POA: Diagnosis not present

## 2019-11-13 DIAGNOSIS — E876 Hypokalemia: Secondary | ICD-10-CM | POA: Diagnosis not present

## 2019-11-13 LAB — COMPREHENSIVE METABOLIC PANEL
ALT: 26 U/L (ref 0–44)
AST: 41 U/L (ref 15–41)
Albumin: 3.7 g/dL (ref 3.5–5.0)
Alkaline Phosphatase: 226 U/L — ABNORMAL HIGH (ref 38–126)
Anion gap: 10 (ref 5–15)
BUN: 11 mg/dL (ref 8–23)
CO2: 23 mmol/L (ref 22–32)
Calcium: 9.6 mg/dL (ref 8.9–10.3)
Chloride: 108 mmol/L (ref 98–111)
Creatinine, Ser: 0.4 mg/dL — ABNORMAL LOW (ref 0.44–1.00)
GFR calc Af Amer: >60 mL/min (ref >60)
GFR calc non Af Amer: >60 mL/min (ref >60)
Glucose, Bld: 90 mg/dL (ref 70–99)
Potassium: 3.6 mmol/L (ref 3.5–5.1)
Sodium: 141 mmol/L (ref 135–145)
Total Bilirubin: 0.5 mg/dL (ref 0.3–1.2)
Total Protein: 7.2 g/dL (ref 6.5–8.1)

## 2019-11-13 LAB — CBC WITH DIFFERENTIAL/PLATELET
Abs Immature Granulocytes: 0.1 10*3/uL — ABNORMAL HIGH (ref 0.00–0.07)
Basophils Absolute: 0 10*3/uL (ref 0.0–0.1)
Basophils Relative: 0 %
Eosinophils Absolute: 0.4 10*3/uL (ref 0.0–0.5)
Eosinophils Relative: 5 %
HCT: 28.9 % — ABNORMAL LOW (ref 36.0–46.0)
Hemoglobin: 9.2 g/dL — ABNORMAL LOW (ref 12.0–15.0)
Lymphocytes Relative: 61 %
Lymphs Abs: 4.6 10*3/uL — ABNORMAL HIGH (ref 0.7–4.0)
MCH: 27.1 pg (ref 26.0–34.0)
MCHC: 31.8 g/dL (ref 30.0–36.0)
MCV: 85.3 fL (ref 80.0–100.0)
Monocytes Absolute: 0.4 10*3/uL (ref 0.1–1.0)
Monocytes Relative: 5 %
Myelocytes: 1 %
Neutro Abs: 2.1 10*3/uL (ref 1.7–7.7)
Neutrophils Relative %: 28 %
Platelets: 190 10*3/uL (ref 150–400)
RBC: 3.39 MIL/uL — ABNORMAL LOW (ref 3.87–5.11)
RDW: 14.3 % (ref 11.5–15.5)
Smear Review: ADEQUATE
WBC: 7.6 10*3/uL (ref 4.0–10.5)
nRBC: 0 % (ref 0.0–0.2)

## 2019-11-13 LAB — MAGNESIUM: Magnesium: 1.6 mg/dL — ABNORMAL LOW (ref 1.7–2.4)

## 2019-11-13 NOTE — Assessment & Plan Note (Addendum)
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 FEB 2021- [facility]- NEGATIVE [Dr.G on 11/22/2019. ].   # CONTINUE TO HOLD revlimid for for now- awaiting resolution of acute issues [see below].  We will plan to start at next visit.  # DIASTOLIC HEART FAILURE-  Continue lasix; metoprolol; reviewed/ reviwed the note from cards-no clinical concerns of amyloidosis.  # Dyaphagia- with solids than liquids- awaiting GI velauation.  Would recommend repeat EGD.  # Hypokalemia potassium 3.1;worse; recommend e p.o. potassium twice a day.  # Bone lesions/pain stable last zometa on 11/27/2017.  Holding Zometa secondary to poor dentition. HOLD Zometa.   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  # DISPOSITION:  # Follow up with 32monthMD- cbc/cmp/mag;-Dr.B

## 2019-11-13 NOTE — Progress Notes (Signed)
Pt in for follow up, denies any concerns today. 

## 2019-11-14 ENCOUNTER — Telehealth: Payer: Self-pay | Admitting: *Deleted

## 2019-11-14 ENCOUNTER — Ambulatory Visit: Payer: Medicare Other | Admitting: Physician Assistant

## 2019-11-14 DIAGNOSIS — E876 Hypokalemia: Secondary | ICD-10-CM

## 2019-11-14 DIAGNOSIS — I10 Essential (primary) hypertension: Secondary | ICD-10-CM

## 2019-11-14 NOTE — Telephone Encounter (Signed)
-----   Message from Loel Dubonnet, NP sent at 11/14/2019  8:26 AM EDT ----- I reviewed Brandi [name]'s labs from oncology visit yesterday. Her kidney function was normal. Continue with Spironolactone 12.5mg  (half tablet) daily as discussed in recent office visit.   Will you please call her to let her know I did review her recent labs. Also, in office visit we discussed repeat BMET in 2 weeks, I would like her to have this done in 1 week (approx 4/27) instead since Dr. Jacinto Reap changed her Kdur (potassium) dose. We will need to monitor her potassium carefully as her potassium tablets and Spironolactone both will affect the level.  Thanks, Loel Dubonnet, NP

## 2019-11-14 NOTE — Telephone Encounter (Signed)
Called and s/w family member. Patient is not home at the moment. He will let the patient know we called and call us back.

## 2019-11-15 NOTE — Telephone Encounter (Signed)
No answer. Left message to call back.   

## 2019-11-16 NOTE — Telephone Encounter (Signed)
Results called to pt. Pt verbalized understanding of results and recommendations. She is aware to go to the [facility] on 11/20/19 for lab work. Lab order entered.

## 2019-11-19 NOTE — Progress Notes (Signed)
[place] OFFICE PROGRESS NOTE  Patient Care Team: Center, Stella as PCP - General (General Practice) End, Harrell Gave, MD as PCP - Cardiology (Cardiology) [name], [name] [name], MD as Consulting Physician (Hematology and Oncology)  Cancer Staging No matching staging information was found for the patient.   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   # 9/14- velcade SQ twice weekly/Dex 40 mg/week; OCT 5th 2018-Start R [65m]VD; 3cycles of RVD- PARTAL RESPONSE  # Jan 11th 2019-Dara-Rev-Dex; April 2019- BMBx- plasma cell -by CD-138/IHC-80% [baseline Sep 2018- 85% ]; HOLD transplant [dw Dr.Gasperatto]  # April 29th 2019 2019- carfil-Cyt-Dex; AUG 6th BMBx- 6% plasma cells; VGPR  # Autologous stem cell transplant on 06/15/18 [[facility]/ Dr.Gasperrato]  # may 1st week-2019- Maintenance Revlimid 10 mg 3w/1w;   FEB 10th 2021- [[facility]]Cellular marrow (50%) with normal trilineage hematopoiesis. No morphologic support for residual myeloma disease. Negative for minimal residual disease by [facility]-MRD flow cytometry; HOLD REVLIMID [leg swelling]  # MARCH 2021-diastolic congestive heart failure [Dr.End]  --------------------------  # 12/12- RIGHT JUGULAR DVT-x 380mn xarelto; finished April 2020; September 2020-EGD/dysphagia; Dr. AnVicente Males# Acute renal failure [Dr.Singh; Proteinuria 1.5gm/day ]; acyclovir/Asprin ------------------------------------------------------------------------------------------------------------   DIAGNOSIS: _0  MULTIPLE MYELOMA  STAGE: III/HIGH RISK ;GOALS:  CONTROL  CURRENT/MOST RECENT THERAPY-maintenance Revlimid    [place]A myeloma ([place] 11[place] -  Chemotherapy   The patient had palonosetron (ALOXI) injection 0.25 mg, 0.25 mg, Intravenous,  Once, 6 of 7 cycles Administration: 0.25 mg (11/21/2017), 0.25 mg (11/29/2017), 0.25 mg (12/05/2017), 0.25 mg (12/21/2017), 0.25 mg (12/28/2017), 0.25 mg (01/04/2018), 0.25 mg (01/18/2018), 0.25 mg (01/25/2018), 0.25 mg (02/01/2018), 0.25 mg (02/15/2018), 0.25 mg (02/22/2018), 0.25 mg (03/01/2018), 0.25 mg (03/15/2018), 0.25 mg (03/22/2018), 0.25 mg (03/30/2018), 0.25 mg (04/13/2018), 0.25 mg (04/20/2018), 0.25 mg (04/27/2018) cyclophosphamide (CYTOXAN) 500 mg in sodium chloride 0.9 % 250 mL chemo infusion, 540 mg, Intravenous,  Once, 6 of 7 cycles Administration: 500 mg (11/21/2017), 500 mg (11/29/2017), 500 mg (12/05/2017), 500 mg (12/28/2017), 500 mg (01/04/2018), 500 mg (01/18/2018), 500 mg (01/25/2018), 500 mg (02/01/2018), 500 mg (02/15/2018), 500 mg (02/22/2018), 500 mg (03/01/2018), 500 mg (03/15/2018), 500 mg (03/30/2018), 500 mg (04/13/2018), 500 mg (04/20/2018), 500 mg (04/27/2018) carfilzomib (KYPROLIS) 36 mg in dextrose 5 % 50 mL chemo infusion, 20 mg/m2 = 36 mg, Intravenous, Once, 6 of 7 cycles Administration: 36 mg (11/21/2017), 36 mg (11/22/2017), 60 mg (11/29/2017), 60 mg (11/30/2017), 60 mg (12/05/2017), 60 mg (12/06/2017), 60 mg (12/21/2017), 60 mg (12/22/2017), 60 mg (12/28/2017), 60 mg (12/29/2017), 60 mg (01/04/2018), 60 mg (01/05/2018), 60 mg (01/18/2018), 60 mg (01/19/2018), 60 mg (01/25/2018), 60 mg (01/27/2018), 60 mg (02/01/2018), 60 mg (02/02/2018), 60 mg (02/15/2018), 60 mg (02/16/2018), 60 mg (02/22/2018), 60 mg (03/01/2018), 60 mg (03/02/2018), 60 mg (03/15/2018), 60 mg (03/16/2018), 60 mg (03/23/2018), 60 mg (03/30/2018), 60 mg (03/31/2018), 60 mg (04/13/2018), 60 mg (04/14/2018), 60 mg (04/20/2018), 60 mg (04/21/2018), 60 mg (04/27/2018)  for chemotherapy treatment.       INTERVAL HISTORY:  Brandi [name].o.  female pleasant patient above history of relapsed  multiple myeloma--most recently on maintenance Revlimid [on hold for 4 months] is here for follow-up.  Revlimid is on hold because of acute issues; congestive  heart failure.  Patient was in the interim evaluated by cardiology.  Patient is currently on Lasix' also added spironolactone because of hypo kalemia; on potassium supplementation.  Continues to have mild shortness of breath on exertion.  Continues to compromise pain in the legs overall improved.  Continues have difficulty swallowing awaiting repeat GI evaluation.  Review of Systems  Constitutional: Positive for malaise/fatigue. Negative for chills, diaphoresis and fever.  HENT: Negative for nosebleeds and sore throat.   Eyes: Negative for double vision.  Respiratory: Positive for cough and shortness of breath. Negative for hemoptysis and sputum production.   Cardiovascular: Positive for leg swelling. Negative for chest pain, palpitations and orthopnea.  Gastrointestinal: Negative for abdominal pain, blood in stool, constipation, heartburn and melena.  Genitourinary: Negative for dysuria, frequency and urgency.  Musculoskeletal: Positive for back pain and myalgias.  Skin: Negative.  Negative for itching and rash.  Neurological: Negative for dizziness, tingling, focal weakness, weakness and headaches.  Endo/Heme/Allergies: Does not bruise/bleed easily.  Psychiatric/Behavioral: Negative for depression. The patient has insomnia. The patient is not nervous/anxious.     PAST MEDICAL HISTORY :  Past Medical History:  Diagnosis Date  . Hypertension   . Multiple myeloma ([place])     PAST SURGICAL HISTORY :   Past Surgical History:  Procedure Laterality Date  . ESOPHAGOGASTRODUODENOSCOPY (EGD) WITH PROPOFOL N/A 03/30/2019   Procedure: ESOPHAGOGASTRODUODENOSCOPY (EGD) WITH PROPOFOL;  Surgeon: Jonathon Bellows, MD;  Location: [facility] ENDOSCOPY;  Service: Gastroenterology;  Laterality: N/A;  . IR FLUORO GUIDE PORT INSERTION RIGHT  12/16/2017     FAMILY HISTORY :   Family History  Problem Relation Age of Onset  . Pneumonia Mother   . Seizures Father     SOCIAL HISTORY:   Social History   Tobacco Use  . Smoking status: Current Some Day Smoker    Packs/day: 0.50    Types: Cigarettes  . Smokeless tobacco: Never Used  Substance Use Topics  . Alcohol use: No  . Drug use: No    ALLERGIES:  has No Known Allergies.  MEDICATIONS:  Current Outpatient Medications  Medication Sig Dispense Refill  . acyclovir (ZOVIRAX) 400 MG tablet TAKE 1 TABLET BY MOUTH ONCE DAILY TO  PREVENT  SHINGLES 30 tablet 0  . albuterol (VENTOLIN HFA) 108 (90 Base) MCG/ACT inhaler Inhale 2 puffs into the lungs every 6 (six) hours as needed for wheezing or shortness of breath. 8 g 3  . amLODipine (NORVASC) 5 MG tablet Take 1 tablet (5 mg total) by mouth daily. 30 tablet 1  . aspirin EC 325 MG tablet Take 325 mg by mouth daily.    . ergocalciferol (VITAMIN D2) 1.25 MG (50000 UT) capsule Take 1 capsule (50,000 Units total) by mouth once a week. 12 capsule 2  . feeding supplement, ENSURE ENLIVE, (ENSURE ENLIVE) LIQD Take 237 mLs by mouth 2 (two) times daily between meals. 60 Bottle 0  . Fluticasone-Salmeterol (ADVAIR DISKUS) 500-50 MCG/DOSE AEPB Inhale 1 puff into the lungs 2 (two) times daily. 1 each 3  . furosemide (LASIX) 40 MG tablet Take 1 tablet (40 mg total) by mouth daily. 90 tablet 3  . metoprolol succinate (TOPROL XL) 25 MG 24 hr tablet Take 1.5 tablets (37.5 mg total) by mouth daily. 45 tablet 3  . pantoprazole (PROTONIX) 40 MG tablet Take 1 tablet (40 mg total) by mouth daily. 90 tablet 3  . polyethylene glycol (MIRALAX / GLYCOLAX) packet Take 17 g by mouth daily as needed for mild constipation. 14 each  0  . potassium chloride SA (KLOR-CON) 20 MEQ tablet Take 1 tablet (20 mEq total) by mouth 2 (two) times daily. (Patient taking differently: Take 20 mEq by mouth 2 (two) times daily. Pt only been one a day) 60 tablet 6  . spironolactone  (ALDACTONE) 25 MG tablet Take 0.5 tablets (12.5 mg total) by mouth daily. 45 tablet 3   No current facility-administered medications for this visit.   Facility-Administered Medications Ordered in Other Visits  Medication Dose Route Frequency Provider Last Rate Last Admin  . sodium chloride flush (NS) 0.9 % injection 10 mL  10 mL Intracatheter PRN [name], MD   10 mL at 02/23/18 1400    PHYSICAL EXAMINATION: ECOG PERFORMANCE STATUS: 1 - Symptomatic but completely ambulatory  BP 121/65 (BP Location: Left Arm, Patient Position: Sitting)   Pulse 97   Temp 97.6 F (36.4 C) (Tympanic)   Resp 18   Wt 116 lb (52.6 kg)   SpO2 100%   BMI 21.22 kg/m   Filed Weights   11/13/19 1455  Weight: 116 lb (52.6 kg)    Physical Exam  Constitutional: She is oriented to person, place, and time and well-developed, well-nourished, and in no distress.  Alone. She is walking herself.  HENT:  Head: Normocephalic and atraumatic.  Poor dentition.  Eyes: Pupils are equal, round, and reactive to light.  Cardiovascular: Normal rate and regular rhythm.  Pulmonary/Chest: Effort normal and breath sounds normal. No respiratory distress. She has no wheezes.  Abdominal: Soft. Bowel sounds are normal. She exhibits no distension and no mass. There is no abdominal tenderness. There is no rebound and no guarding.  Musculoskeletal:        General: Edema present. No tenderness. Normal range of motion.     Cervical back: Normal range of motion and neck supple.  Neurological: She is alert and oriented to person, place, and time.  Skin: Skin is warm.  Psychiatric: Affect normal.   LABORATORY DATA:  I have reviewed the data as listed    Component Value Date/Time   NA 141 11/13/2019 1420   K 3.6 11/13/2019 1420   CL 108 11/13/2019 1420   CO2 23 11/13/2019 1420   GLUCOSE 90 11/13/2019 1420   BUN 11 11/13/2019 1420   CREATININE 0.40 (L) 11/13/2019 1420   CALCIUM 9.6 11/13/2019 1420   PROT 7.2  11/13/2019 1420   ALBUMIN 3.7 11/13/2019 1420   AST 41 11/13/2019 1420   ALT 26 11/13/2019 1420   ALKPHOS 226 (H) 11/13/2019 1420   BILITOT 0.5 11/13/2019 1420   GFRNONAA >60 11/13/2019 1420   GFRAA >60 11/13/2019 1420    No results found for: SPEP, UPEP  Lab Results  Component Value Date   WBC 7.6 11/13/2019   NEUTROABS 2.1 11/13/2019   HGB 9.2 (L) 11/13/2019   HCT 28.9 (L) 11/13/2019   MCV 85.3 11/13/2019   PLT 190 11/13/2019      Chemistry      Component Value Date/Time   NA 141 11/13/2019 1420   K 3.6 11/13/2019 1420   CL 108 11/13/2019 1420   CO2 23 11/13/2019 1420   BUN 11 11/13/2019 1420   CREATININE 0.40 (L) 11/13/2019 1420      Component Value Date/Time   CALCIUM 9.6 11/13/2019 1420   ALKPHOS 226 (H) 11/13/2019 1420   AST 41 11/13/2019 1420   ALT 26 11/13/2019 1420   BILITOT 0.5 11/13/2019 1420       RADIOGRAPHIC STUDIES: I have  personally reviewed the radiological images as listed and agreed with the findings in the report. No results found.   ASSESSMENT & PLAN:  IgA myeloma ([place]) #Multiple myeloma stage III high-risk cytogenetics-status post KRD-VGPR ; status post autologousstem cell transplant on 06/15/18. FEB 2021- [facility]- NEGATIVE [Dr.G on 11/22/2019. ].   # CONTINUE TO HOLD revlimid for for now- awaiting resolution of acute issues [see below].  We will plan to start at next visit.  # DIASTOLIC HEART FAILURE-  Continue lasix; metoprolol; reviewed/ reviwed the note from cards-no clinical concerns of amyloidosis.  # Dyaphagia- with solids than liquids- awaiting GI velauation.  Would recommend repeat EGD.  # Hypokalemia potassium 3.1;worse; recommend e p.o. potassium twice a day.  # Bone lesions/pain stable last zometa on 11/27/2017.  Holding Zometa secondary to poor dentition. HOLD Zometa.   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  # DISPOSITION:  # Follow up with 35monthMD- cbc/cmp/mag;-Dr.B   Orders Placed This  Encounter  Procedures  . CBC with Differential    Standing Status:   Future    Standing Expiration Date:   11/12/2020  . Comprehensive metabolic panel    Standing Status:   Future    Standing Expiration Date:   11/12/2020  . Magnesium    Standing Status:   Future    Standing Expiration Date:   11/12/2020   All questions were answered. The patient knows to call the clinic with any problems, questions or concerns.      GCammie Sickle MD 11/19/2019 7:44 PM

## 2019-11-20 ENCOUNTER — Ambulatory Visit: Payer: Medicare Other | Attending: Internal Medicine

## 2019-11-26 ENCOUNTER — Ambulatory Visit: Payer: Medicare Other | Admitting: Physical Therapy

## 2019-11-28 ENCOUNTER — Ambulatory Visit: Payer: Medicare Other | Admitting: Physical Therapy

## 2019-11-29 ENCOUNTER — Other Ambulatory Visit: Payer: Self-pay | Admitting: *Deleted

## 2019-11-29 ENCOUNTER — Telehealth: Payer: Self-pay | Admitting: *Deleted

## 2019-11-29 DIAGNOSIS — C9002 Multiple myeloma in relapse: Secondary | ICD-10-CM

## 2019-11-29 MED ORDER — ERGOCALCIFEROL 1.25 MG (50000 UT) PO CAPS: 50000.0000 [IU] | ORAL_CAPSULE | ORAL | 2 refills | Status: DC

## 2019-11-29 MED ORDER — ACYCLOVIR 400 MG PO TABS: ORAL_TABLET | ORAL | 0 refills | Status: DC

## 2019-11-29 NOTE — Telephone Encounter (Signed)
Brandi [name]- please follow up in [facility]. Thanks GB

## 2019-11-29 NOTE — Telephone Encounter (Signed)
Patient called and reports that she thinks she needs to be seen due to falling and hurting her ribs which are still sore. Please advise

## 2019-11-30 NOTE — Telephone Encounter (Signed)
No problem. We will see her today!  Faythe Casa, NP 11/30/2019 9:03 AM

## 2019-11-30 NOTE — Telephone Encounter (Signed)
2nd voice mail msg left for patient - requested call back from patient.

## 2019-11-30 NOTE — Telephone Encounter (Signed)
I left a vm for patient - requesting a return phone call. Per Doni, RN in [facility], ok to add patient to [facility] clinic. I attempted to schedule patient for 1pm this afternoon. No answer

## 2019-12-03 ENCOUNTER — Inpatient Hospital Stay: Payer: Medicare Other | Attending: Obstetrics and Gynecology

## 2019-12-03 ENCOUNTER — Ambulatory Visit: Payer: Medicare Other | Admitting: Physical Therapy

## 2019-12-03 DIAGNOSIS — Z79899 Other long term (current) drug therapy: Secondary | ICD-10-CM | POA: Insufficient documentation

## 2019-12-03 DIAGNOSIS — I110 Hypertensive heart disease with heart failure: Secondary | ICD-10-CM | POA: Insufficient documentation

## 2019-12-03 DIAGNOSIS — F17210 Nicotine dependence, cigarettes, uncomplicated: Secondary | ICD-10-CM | POA: Insufficient documentation

## 2019-12-03 DIAGNOSIS — E876 Hypokalemia: Secondary | ICD-10-CM | POA: Insufficient documentation

## 2019-12-03 DIAGNOSIS — C9000 Multiple myeloma not having achieved remission: Secondary | ICD-10-CM | POA: Insufficient documentation

## 2019-12-03 DIAGNOSIS — I5030 Unspecified diastolic (congestive) heart failure: Secondary | ICD-10-CM | POA: Insufficient documentation

## 2019-12-03 NOTE — Progress Notes (Signed)
Nutrition Follow-up:  Patient with multiple myeloma.  Patient followed by Dr. Jacinto Reap.   Spoke with patient via phone for follow-up.  Patient reports that appetite is not that good.  States she is going to talk to Dr. B about starting her back on appetite stimulant.  Reports that she has to force herself to eat.  States that she is drinking 5 ensure/boost shakes per day (all varying calorie level).  Reports swallowing is a little bit better.  Reports she may eat cereal or egg in the am and eats a little bit of evening meal (chicken, mashed potatoes).  Reports that swelling in legs is gone.   Medications: reviewed  Labs: reviewed  Anthropometrics:   Weight 116 lb on 4/20 decreased from 120 lb on 4/6 126 lb on 3/3 (?? Swelling at this visit)   NUTRITION DIAGNOSIS: Inadequate oral intake continues   INTERVENTION:  Patient never picked up sample bag of shakes or case of ensure enlive.   Reviewed that if patient drinks 5, 350 calorie shake will better meet calorie needs. Patient verbalized understanding Patient to speak with MD regarding appetite stimulant Patient has contact information    MONITORING, EVALUATION, GOAL: Patient will consume adequate calories and protein to maintain weight   NEXT VISIT: June 21, phone f/u  Malaina Mortellaro B. Zenia Resides, [place], [place] Registered Dietitian [phone] (pager)

## 2019-12-05 ENCOUNTER — Ambulatory Visit: Payer: Medicare Other | Admitting: Physical Therapy

## 2019-12-10 ENCOUNTER — Ambulatory Visit: Payer: Medicare Other | Admitting: Physical Therapy

## 2019-12-11 ENCOUNTER — Encounter: Payer: Self-pay | Admitting: Internal Medicine

## 2019-12-11 ENCOUNTER — Inpatient Hospital Stay: Payer: Medicare Other

## 2019-12-11 ENCOUNTER — Other Ambulatory Visit: Payer: Self-pay

## 2019-12-11 ENCOUNTER — Inpatient Hospital Stay (HOSPITAL_BASED_OUTPATIENT_CLINIC_OR_DEPARTMENT_OTHER): Payer: Medicare Other | Admitting: Internal Medicine

## 2019-12-11 VITALS — BP 109/59 | HR 79 | Temp 98.5°F | Resp 18 | Wt 114.8 lb

## 2019-12-11 DIAGNOSIS — E876 Hypokalemia: Secondary | ICD-10-CM | POA: Diagnosis not present

## 2019-12-11 DIAGNOSIS — C9000 Multiple myeloma not having achieved remission: Secondary | ICD-10-CM

## 2019-12-11 DIAGNOSIS — D509 Iron deficiency anemia, unspecified: Secondary | ICD-10-CM

## 2019-12-11 DIAGNOSIS — I110 Hypertensive heart disease with heart failure: Secondary | ICD-10-CM | POA: Diagnosis not present

## 2019-12-11 DIAGNOSIS — I5030 Unspecified diastolic (congestive) heart failure: Secondary | ICD-10-CM | POA: Diagnosis not present

## 2019-12-11 DIAGNOSIS — F17210 Nicotine dependence, cigarettes, uncomplicated: Secondary | ICD-10-CM | POA: Diagnosis not present

## 2019-12-11 DIAGNOSIS — Z79899 Other long term (current) drug therapy: Secondary | ICD-10-CM | POA: Diagnosis not present

## 2019-12-11 LAB — MAGNESIUM: Magnesium: 1.4 mg/dL — ABNORMAL LOW (ref 1.7–2.4)

## 2019-12-11 LAB — COMPREHENSIVE METABOLIC PANEL
ALT: 21 U/L (ref 0–44)
AST: 23 U/L (ref 15–41)
Albumin: 3.4 g/dL — ABNORMAL LOW (ref 3.5–5.0)
Alkaline Phosphatase: 229 U/L — ABNORMAL HIGH (ref 38–126)
Anion gap: 7 (ref 5–15)
BUN: 13 mg/dL (ref 8–23)
CO2: 23 mmol/L (ref 22–32)
Calcium: 9.3 mg/dL (ref 8.9–10.3)
Chloride: 110 mmol/L (ref 98–111)
Creatinine, Ser: 0.37 mg/dL — ABNORMAL LOW (ref 0.44–1.00)
GFR calc Af Amer: >60 mL/min (ref >60)
GFR calc non Af Amer: >60 mL/min (ref >60)
Glucose, Bld: 117 mg/dL — ABNORMAL HIGH (ref 70–99)
Potassium: 3.5 mmol/L (ref 3.5–5.1)
Sodium: 140 mmol/L (ref 135–145)
Total Bilirubin: 0.8 mg/dL (ref 0.3–1.2)
Total Protein: 6.8 g/dL (ref 6.5–8.1)

## 2019-12-11 LAB — CBC WITH DIFFERENTIAL/PLATELET
Abs Immature Granulocytes: 0 10*3/uL (ref 0.00–0.07)
Basophils Absolute: 0 10*3/uL (ref 0.0–0.1)
Basophils Relative: 1 %
Eosinophils Absolute: 0.3 10*3/uL (ref 0.0–0.5)
Eosinophils Relative: 5 %
HCT: 27.6 % — ABNORMAL LOW (ref 36.0–46.0)
Hemoglobin: 8.7 g/dL — ABNORMAL LOW (ref 12.0–15.0)
Immature Granulocytes: 0 %
Lymphocytes Relative: 43 %
Lymphs Abs: 2.9 10*3/uL (ref 0.7–4.0)
MCH: 26.7 pg (ref 26.0–34.0)
MCHC: 31.5 g/dL (ref 30.0–36.0)
MCV: 84.7 fL (ref 80.0–100.0)
Monocytes Absolute: 0.5 10*3/uL (ref 0.1–1.0)
Monocytes Relative: 7 %
Neutro Abs: 3 10*3/uL (ref 1.7–7.7)
Neutrophils Relative %: 44 %
Platelets: 153 10*3/uL (ref 150–400)
RBC: 3.26 MIL/uL — ABNORMAL LOW (ref 3.87–5.11)
RDW: 13.2 % (ref 11.5–15.5)
WBC: 6.7 10*3/uL (ref 4.0–10.5)
nRBC: 0 % (ref 0.0–0.2)

## 2019-12-11 LAB — IRON AND TIBC
Iron: 59 ug/dL (ref 28–170)
Saturation Ratios: 26 % (ref 10.4–31.8)
TIBC: 228 ug/dL — ABNORMAL LOW (ref 250–450)
UIBC: 169 ug/dL

## 2019-12-11 LAB — LACTATE DEHYDROGENASE: LDH: 145 U/L (ref 98–192)

## 2019-12-11 LAB — FERRITIN: Ferritin: 241 ng/mL (ref 11–307)

## 2019-12-11 MED ORDER — MEGESTROL ACETATE 625 MG/5ML PO SUSP: 625.0000 mg | Freq: Every day | ORAL | 0 refills | Status: DC

## 2019-12-11 MED ORDER — AMLODIPINE BESYLATE 5 MG PO TABS: 5.0000 mg | ORAL_TABLET | Freq: Every day | ORAL | 1 refills | Status: DC

## 2019-12-11 NOTE — Assessment & Plan Note (Addendum)
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 FEB 2021- [facility]- NEGATIVE [[facility],Dr[facility]G on 11/22/2019].  Continue to hold maintenance Revlimid because of acute issues-see below  #Anemia-hemoglobin 8.7; unclear etiology; recheck iron studies LDH also recommend a PET scan for further evaluate multiple myeloma.  # DIASTOLIC HEART FAILURE-  Continue lasix; metoprolol; reviewed/ reviwed the note from cards-no clinical concerns of amyloidosis.  Prescribed 5 mg Norvasc.  # Hypokalemia potassium 3.5 stable.  Recommend e p.o. potassium twice a day.  # Bone lesions/pain stable last zometa on 11/27/2017.  Holding Zometa secondary to poor dentition.  Hold Zometa  #Weight loss-recommend Megace.;  Reviewed note from nutrition.  # DISPOSITION: ADD LDH/iron studies/ferrtiin  # Follow up end of 1st week of June- MD; labs-cbc/ cbc/cmp/mag;PET scan prior--Dr.B

## 2019-12-11 NOTE — Progress Notes (Signed)
Pt in for follow up, reports fell 3 weeks ago and hurt ribs on left side.  Pt weight continues to decrease.  Pt request refill on amlodipine 5mg .

## 2019-12-11 NOTE — Progress Notes (Signed)
[place] OFFICE PROGRESS NOTE  Patient Care Team: Center, [place] as PCP - General (General Practice) End, Har[place] Gave, MD as PCP - Cardiology (Cardiology) [name], [name] [name], MD as Consulting Physician (Hematology and Oncology)  Cancer Staging No matching staging information was found for the patient.   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 No morphologic support for residual myeloma disease. Negative for minimal residual disease by [facility]-MRD flow cytometry; HOLD REVLIMID [leg swelling]  # MARCH 2021-diastolic congestive heart failure [Dr.End]  --------------------------  # 12/12- RIGHT JUGULAR DVT-x 366mn xarelto; finished April 2020; September 2020-EGD/dysphagia; Dr. AnVicente Males# Acute renal failure [Dr.Singh; Proteinuria 1.5gm/day ]; acyclovir/Asprin ------------------------------------------------------------------------------------------------------------   DIAGNOSIS: _0  MULTIPLE MYELOMA  STAGE: III/HIGH RISK ;GOALS:  CONTROL  CURRENT/MOST RECENT THERAPY-maintenance Revlimid    IgA myeloma ([place] 11/07/2017 -  Chemotherapy   The patient had palonosetron (ALOXI) injection 0.25 mg, 0.25 mg, Intravenous,  Once, 6 of 7 cycles Administration: 0.25 mg (11/21/2017), 0.25 mg (11/29/2017), 0.25 mg (12/05/2017), 0.25 mg (12/21/2017), 0.25 mg (12/28/2017), 0.25 mg (01/04/2018), 0.25 mg (01/18/2018), 0.25 mg (01/25/2018), 0.25 mg (02/01/2018), 0.25 mg (02/15/2018), 0.25 mg (02/22/2018), 0.25 mg (03/01/2018), 0.25 mg (03/15/2018), 0.25 mg (03/22/2018), 0.25 mg (03/30/2018), 0.25 mg (04/13/2018), 0.25 mg (04/20/2018), 0.25 mg (04/27/2018) cyclophosphamide (CYTOXAN) 500 mg in sodium chloride 0.9 % 250 mL chemo infusion, 540 mg, Intravenous,  Once, 6 of 7 cycles Administration: 500 mg (11/21/2017), 500 mg (11/29/2017), 500 mg (12/05/2017), 500 mg (12/28/2017), 500 mg (01/04/2018), 500 mg (01/18/2018), 500 mg (01/25/2018), 500 mg (02/01/2018), 500 mg (02/15/2018), 500 mg (02/22/2018), 500 mg (03/01/2018), 500 mg (03/15/2018), 500 mg (03/30/2018), 500 mg (04/13/2018), 500 mg (04/20/2018), 500 mg (04/27/2018) carfilzomib (KYPROLIS) 36 mg in dextrose 5 % 50 mL chemo infusion, 20 mg/m2 = 36 mg, Intravenous, Once, 6 of 7 cycles Administration: 36 mg (11/21/2017), 36 mg (11/22/2017), 60 mg (11/29/2017), 60 mg (11/30/2017), 60 mg (12/05/2017), 60 mg (12/06/2017), 60 mg (12/21/2017), 60 mg (12/22/2017), 60 mg (12/28/2017), 60 mg (12/29/2017), 60 mg (01/04/2018), 60 mg (01/05/2018), 60 mg (01/18/2018), 60 mg (01/19/2018), 60 mg (01/25/2018), 60 mg (01/27/2018), 60 mg (02/01/2018), 60 mg (02/02/2018), 60 mg (02/15/2018), 60 mg (02/16/2018), 60 mg (02/22/2018), 60 mg (03/01/2018), 60 mg (03/02/2018), 60 mg (03/15/2018), 60 mg (03/16/2018), 60 mg (03/23/2018), 60 mg (03/30/2018), 60 mg (03/31/2018), 60 mg (04/13/2018), 60 mg (04/14/2018), 60 mg (04/20/2018), 60 mg (04/21/2018), 60 mg (04/27/2018)  for chemotherapy treatment.       INTERVAL HISTORY:  Brandi MARSCHNER80.o.  female pleasant patient above history of relapsed  multiple myeloma--most recently on maintenance Revlimid [on hold for 5 months] is here for follow-up.  Patient continues to feel poorly.  She has lost  weight.  She is currently on diuretics for CHF.  Appetite is poor.   Denies any difficulty swallowing.  Chronic mild shortness of breath chronic cough.   Review of Systems  Constitutional: Positive for malaise/fatigue. Negative for chills, diaphoresis and fever.  HENT: Negative for nosebleeds and sore throat.   Eyes: Negative for double vision.  Respiratory: Positive for cough and shortness of breath. Negative for hemoptysis and sputum production.   Cardiovascular: Positive for leg swelling. Negative for chest pain, palpitations and orthopnea.  Gastrointestinal: Negative for abdominal pain, blood in stool, constipation, heartburn and melena.  Genitourinary: Negative for dysuria, frequency and urgency.  Musculoskeletal: Positive for back pain and myalgias.  Skin: Negative.  Negative for itching and rash.  Neurological: Negative for dizziness, tingling, focal weakness, weakness and headaches.  Endo/Heme/Allergies: Does not bruise/bleed easily.  Psychiatric/Behavioral: Negative for depression. The patient has insomnia. The patient is not nervous/anxious.     PAST MEDICAL HISTORY :  Past Medical History:  Diagnosis Date  . Hypertension   . Multiple myeloma ([place])     PAST SURGICAL HISTORY :   Past Surgical History:  Procedure Laterality Date  . ESOPHAGOGASTRODUODENOSCOPY (EGD) WITH PROPOFOL N/A 03/30/2019   Procedure: ESOPHAGOGASTRODUODENOSCOPY (EGD) WITH PROPOFOL;  Surgeon: Jonathon Bellows, MD;  Location: [facility] ENDOSCOPY;  Service: Gastroenterology;  Laterality: N/A;  . IR FLUORO GUIDE PORT INSERTION RIGHT  12/16/2017    FAMILY HISTORY :   Family History  Problem Relation Age of Onset  . Pneumonia Mother   . Seizures Father     SOCIAL HISTORY:   Social History   Tobacco Use  . Smoking status: Current Some Day Smoker    Packs/day:  0.50    Types: Cigarettes  . Smokeless tobacco: Never Used  Substance Use Topics  . Alcohol use: No  . Drug use: No    ALLERGIES:  has No Known Allergies.  MEDICATIONS:  Current Outpatient Medications  Medication Sig Dispense Refill  . acyclovir (ZOVIRAX) 400 MG tablet TAKE 1 TABLET BY MOUTH ONCE DAILY TO  PREVENT  SHINGLES 30 tablet 0  . albuterol (VENTOLIN HFA) 108 (90 Base) MCG/ACT inhaler Inhale 2 puffs into the lungs every 6 (six) hours as needed for wheezing or shortness of breath. 8 g 3  . amLODipine (NORVASC) 5 MG tablet Take 1 tablet (5 mg total) by mouth daily. 30 tablet 1  . aspirin EC 325 MG tablet Take 325 mg by mouth daily.    . ergocalciferol (VITAMIN D2) 1.25 MG (50000 UT) capsule Take 1 capsule (50,000 Units total) by mouth once a week. 12 capsule 2  . feeding supplement, ENSURE ENLIVE, (ENSURE ENLIVE) LIQD Take 237 mLs by mouth 2 (two) times daily between meals. 60 Bottle 0  . Fluticasone-Salmeterol (ADVAIR DISKUS) 500-50 MCG/DOSE AEPB Inhale 1 puff into the lungs 2 (two) times daily. 1 each 3  . furosemide (LASIX) 40 MG tablet Take 1 tablet (40 mg total) by mouth daily. 90 tablet 3  . metoprolol succinate (TOPROL XL) 25 MG 24 hr tablet Take 1.5 tablets (37.5 mg total) by mouth daily. 45 tablet 3  . pantoprazole (PROTONIX) 40 MG tablet Take 1 tablet (40 mg total) by mouth daily. 90 tablet 3  . polyethylene glycol (MIRALAX / GLYCOLAX) packet Take 17 g by mouth daily as needed for mild constipation. 14 each 0  . potassium chloride SA (KLOR-CON) 20 MEQ tablet Take 1 tablet (20 mEq total) by mouth 2 (two) times daily. (Patient taking differently: Take 20 mEq  by mouth 2 (two) times daily. Pt only been one a day) 60 tablet 6  . spironolactone (ALDACTONE) 25 MG tablet Take 0.5 tablets (12.5 mg total) by mouth daily. 45 tablet 3  . megestrol (MEGACE ES) 625 MG/5ML suspension Take 5 mLs (625 mg total) by mouth daily. 150 mL 0   No current facility-administered medications for  this visit.   Facility-Administered Medications Ordered in Other Visits  Medication Dose Route Frequency Provider Last Rate Last Admin  . sodium chloride flush (NS) 0.9 % injection 10 mL  10 mL Intracatheter PRN [name], MD   10 mL at 02/23/18 1400    PHYSICAL EXAMINATION: ECOG PERFORMANCE STATUS: 1 - Symptomatic but completely ambulatory  BP (!) 109/59 (BP Location: Left Arm, Patient Position: Sitting)   Pulse 79   Temp 98.5 F (36.9 C) (Tympanic)   Resp 18   Wt 114 lb 12.8 oz (52.1 kg)   SpO2 99%   BMI 21.00 kg/m   Filed Weights   12/11/19 1535  Weight: 114 lb 12.8 oz (52.1 kg)    Physical Exam  Constitutional: She is oriented to person, place, and time and well-developed, well-nourished, and in no distress.  Alone. She is walking herself.  HENT:  Head: Normocephalic and atraumatic.  Poor dentition.  Eyes: Pupils are equal, round, and reactive to light.  Cardiovascular: Normal rate and regular rhythm.  Pulmonary/Chest: Effort normal and breath sounds normal. No respiratory distress. She has no wheezes.  Abdominal: Soft. Bowel sounds are normal. She exhibits no distension and no mass. There is no abdominal tenderness. There is no rebound and no guarding.  Musculoskeletal:        General: Edema present. No tenderness. Normal range of motion.     Cervical back: Normal range of motion and neck supple.  Neurological: She is alert and oriented to person, place, and time.  Skin: Skin is warm.  Psychiatric: Affect normal.   LABORATORY DATA:  I have reviewed the data as listed    Component Value Date/Time   NA 140 12/11/2019 1512   K 3.5 12/11/2019 1512   CL 110 12/11/2019 1512   CO2 23 12/11/2019 1512   GLUCOSE 117 (H) 12/11/2019 1512   BUN 13 12/11/2019 1512   CREATININE 0.37 (L) 12/11/2019 1512   CALCIUM 9.3 12/11/2019 1512   PROT 6.8 12/11/2019 1512   ALBUMIN 3.4 (L) 12/11/2019 1512   AST 23 12/11/2019 1512   ALT 21 12/11/2019 1512   ALKPHOS 229  (H) 12/11/2019 1512   BILITOT 0.8 12/11/2019 1512   GFRNONAA >60 12/11/2019 1512   GFRAA >60 12/11/2019 1512    No results found for: SPEP, UPEP  Lab Results  Component Value Date   WBC 6.7 12/11/2019   NEUTROABS 3.0 12/11/2019   HGB 8.7 (L) 12/11/2019   HCT 27.6 (L) 12/11/2019   MCV 84.7 12/11/2019   PLT 153 12/11/2019      Chemistry      Component Value Date/Time   NA 140 12/11/2019 1512   K 3.5 12/11/2019 1512   CL 110 12/11/2019 1512   CO2 23 12/11/2019 1512   BUN 13 12/11/2019 1512   CREATININE 0.37 (L) 12/11/2019 1512      Component Value Date/Time   CALCIUM 9.3 12/11/2019 1512   ALKPHOS 229 (H) 12/11/2019 1512   AST 23 12/11/2019 1512   ALT 21 12/11/2019 1512   BILITOT 0.8 12/11/2019 1512       RADIOGRAPHIC STUDIES: I have personally  reviewed the radiological images as listed and agreed with the findings in the report. No results found.   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 FEB 2021- [facility]- NEGATIVE [[facility]Dr[facility]G on 11/22/2019].  Continue to hold maintenance Revlimid because of acute issues-see below  #Anemia-hemoglobin 8.7; unclear etiology; recheck iron studies LDH also recommend a PET scan for further evaluate multiple myeloma.  # DIASTOLIC HEART FAILURE-  Continue lasix; metoprolol; reviewed/ reviwed the note from cards-no clinical concerns of amyloidosis.  Prescribed 5 mg Norvasc.  # Hypokalemia potassium 3.5 stable.  Recommend e p.o. potassium twice a day.  # Bone lesions/pain stable last zometa on 11/27/2017.  Holding Zometa secondary to poor dentition.  Hold Zometa  #Weight loss-recommend Megace.;  Reviewed note from nutrition.  # DISPOSITION: ADD LDH/iron studies/ferrtiin  # Follow up end of 1st week of June- MD; labs-cbc/ cbc/cmp/mag;PET scan prior--Dr.B   Orders Placed This Encounter  Procedures  . NM PET  Image Restage (PS) Whole Body    Standing Status:   Future    Standing Expiration Date:   12/10/2020    Order Specific Question:   ** REASON FOR EXAM (FREE TEXT)    Answer:   multiple myeloma    Order Specific Question:   If indicated for the ordered procedure, I authorize the administration of a radiopharmaceutical per Radiology protocol    Answer:   Yes    Order Specific Question:   Radiology Contrast Protocol - do NOT remove file path    Answer:   [email or web address]  . Lactate dehydrogenase    Standing Status:   Future    Number of Occurrences:   1    Standing Expiration Date:   12/10/2020  . Ferritin    Standing Status:   Future    Number of Occurrences:   1    Standing Expiration Date:   12/10/2020  . Iron and TIBC    Standing Status:   Future    Number of Occurrences:   1    Standing Expiration Date:   12/10/2020  . CBC with Differential    Standing Status:   Future    Standing Expiration Date:   12/10/2020  . Comprehensive metabolic panel    Standing Status:   Future    Standing Expiration Date:   12/10/2020  . Magnesium    Standing Status:   Future    Standing Expiration Date:   12/10/2020   All questions were answered. The patient knows to call the clinic with any problems, questions or concerns.      [name], MD 12/11/2019 4:21 PM

## 2019-12-12 ENCOUNTER — Ambulatory Visit: Payer: Medicare Other | Admitting: Physical Therapy

## 2019-12-14 ENCOUNTER — Encounter: Payer: Self-pay | Admitting: Internal Medicine

## 2019-12-14 ENCOUNTER — Other Ambulatory Visit: Payer: Self-pay

## 2019-12-14 ENCOUNTER — Ambulatory Visit (INDEPENDENT_AMBULATORY_CARE_PROVIDER_SITE_OTHER): Payer: Medicare Other | Admitting: Internal Medicine

## 2019-12-14 VITALS — BP 100/50 | HR 73 | Ht 62.0 in | Wt 113.5 lb

## 2019-12-14 DIAGNOSIS — I5032 Chronic diastolic (congestive) heart failure: Secondary | ICD-10-CM | POA: Diagnosis not present

## 2019-12-14 DIAGNOSIS — I10 Essential (primary) hypertension: Secondary | ICD-10-CM

## 2019-12-14 NOTE — Progress Notes (Signed)
Follow-up Outpatient Visit Date: 12/14/2019  Primary Care Provider: Center, [facility] [place] [address]  Chief Complaint: Follow-up swelling and shortness of breath  HPI:  Ms. Bellizzi is a 69 y.o. female with history of chronic HFpEF, IgA myeloma, and hypertension, who presents for follow-up of HFpEF.  She was last seen in our office a month ago by Laurann Montana, NP, at which time she reported feeling much better after our initial visit in early March with less lower extremity edema after de-escalation of amlodipine and addition of metoprolol.  She was continued on furosemide 40 mg daily.  Metoprolol was increased and spironolactone added at her follow-up visit with Ms. Walker.  Today, Ms. Najar reports that she is doing relatively well.  She continues to have shortness of breath with activity, albeit improved compared to a few months ago.  Her leg swelling has been minimal.  She denies chest pain, palpitations, and orthopnea.  She fell a few weeks ago and fractured a left-sided rib.  Pain has final resolved.  Ms. Zeno notes itching ~3 weeks ago without clear inciting factor.  This has resolved on its own.  Home BP's have been similar to today's office reading.  Ms. Koke notes only mild lightheadedness.  Recent fall was mechanical, not precipitated by dizziness.  --------------------------------------------------------------------------------------------------  Cardiovascular History & Procedures: Cardiovascular Problems:  Pulmonary hypertension by echo (moderate)  Edema  Dyspnea on exertion  Risk Factors:  Hypertension and age > 45  Cath/PCI:  None  CV Surgery:  None  EP Procedures and Devices:  None  Non-Invasive Evaluation(s):  TTE (08/20/19): Normal LV size and wall thickness with LVEF 60-65% with normal wall motion.  Normal RV size and function.  Normal atrial size.  Trivial MR.  Moderate pulmonary hypertension.   Recent CV Pertinent Labs: Lab Results  Component Value Date   INR 0.92 03/07/2018   BNP 539.0 (H) 09/18/2019   K 3.5 12/11/2019   MG 1.4 (L) 12/11/2019   BUN 13 12/11/2019   CREATININE 0.37 (L) 12/11/2019    Past medical and surgical history were reviewed and updated in EPIC.  Current Meds  Medication Sig  . acyclovir (ZOVIRAX) 400 MG tablet TAKE 1 TABLET BY MOUTH ONCE DAILY TO  PREVENT  SHINGLES  . albuterol (VENTOLIN HFA) 108 (90 Base) MCG/ACT inhaler Inhale 2 puffs into the lungs every 6 (six) hours as needed for wheezing or shortness of breath.  [name] aspirin EC 325 MG tablet Take 325 mg by mouth daily.  . ergocalciferol (VITAMIN D2) 1.25 MG (50000 UT) capsule Take 1 capsule (50,000 Units total) by mouth once a week.  . feeding supplement, ENSURE ENLIVE, (ENSURE ENLIVE) LIQD Take 237 mLs by mouth 2 (two) times daily between meals.  . Fluticasone-Salmeterol (ADVAIR DISKUS) 500-50 MCG/DOSE AEPB Inhale 1 puff into the lungs 2 (two) times daily.  . furosemide (LASIX) 40 MG tablet Take 1 tablet (40 mg total) by mouth daily.  . megestrol (MEGACE ES) 625 MG/5ML suspension Take 5 mLs (625 mg total) by mouth daily.  . metoprolol succinate (TOPROL XL) 25 MG 24 hr tablet Take 1.5 tablets (37.5 mg total) by mouth daily.  . pantoprazole (PROTONIX) 40 MG tablet Take 1 tablet (40 mg total) by mouth daily.  . polyethylene glycol (MIRALAX / GLYCOLAX) packet Take 17 g by mouth daily as needed for mild constipation.  . potassium chloride SA (KLOR-CON) 20 MEQ tablet Take 1 tablet (20 mEq total) by mouth 2 (two)  times daily. (Patient taking differently: Take 20 mEq by mouth 2 (two) times daily. Pt only been one a day)  . spironolactone (ALDACTONE) 25 MG tablet Take 0.5 tablets (12.5 mg total) by mouth daily.  . [DISCONTINUED] amLODipine (NORVASC) 5 MG tablet Take 1 tablet (5 mg total) by mouth daily.    Allergies: Patient has no known allergies.  Social History   Tobacco Use  . Smoking status:  Current Some Day Smoker    Packs/day: 0.50    Types: Cigarettes  . Smokeless tobacco: Never Used  Substance Use Topics  . Alcohol use: No  . Drug use: No    Family History  Problem Relation Age of Onset  . Pneumonia Mother   . Seizures Father     Review of Systems: A 12-system review of systems was performed and was negative except as noted in the HPI.  --------------------------------------------------------------------------------------------------  Physical Exam: BP (!) 100/50 (BP Location: Left Arm, Patient Position: Sitting, Cuff Size: Normal)   Pulse 73   Ht 5\' 2"  (1.575 m)   Wt 113 lb 8 oz (51.5 kg)   SpO2 99%   BMI 20.76 kg/m   General:  NAD. Neck: No JVD or HJR. Lungs: Normal work of breathing. Clear to auscultation bilaterally without wheezes or crackles. Heart: Regular rate and rhythm without murmurs, rubs, or gallops. Abd: Bowel sounds present. Soft, NT/ND. Ext: Trace pretibial edema bilaterally.  EKG:  NSR with nonspecific T wave changes.  Lab Results  Component Value Date   WBC 6.7 12/11/2019   HGB 8.7 (L) 12/11/2019   HCT 27.6 (L) 12/11/2019   MCV 84.7 12/11/2019   PLT 153 12/11/2019    Lab Results  Component Value Date   NA 140 12/11/2019   K 3.5 12/11/2019   CL 110 12/11/2019   CO2 23 12/11/2019   BUN 13 12/11/2019   CREATININE 0.37 (L) 12/11/2019   GLUCOSE 117 (H) 12/11/2019   ALT 21 12/11/2019    --------------------------------------------------------------------------------------------------  ASSESSMENT AND PLAN: Chronic HFpEF: Other than trace pretibial edema, Ms. Duru appears euvolemic with stable NYHA class II-III symptoms.  Exertional dyspnea could also be due to other factors.  Given trace pedal edema and soft BP, we have agreed to stop amlodipine  No other medication changes today.  Hypertension: BP actually borderline low today.  We will stop amlodipine.  No other medication changes today.  Follow-up: Return to clinic  in 6 weeks.  Nelva Bush, MD 12/14/2019 10:43 PM

## 2019-12-14 NOTE — Patient Instructions (Signed)
Medication Instructions:  Your physician has recommended you make the following change in your medication:  1- STOP Amlodipine.   *If you need a refill on your cardiac medications before your next appointment, please call your pharmacy*   Follow-Up: At [facility], you and your health needs are our priority.  As part of our continuing mission to provide you with exceptional heart care, we have created designated Provider Care Teams.  These Care Teams include your primary Cardiologist (physician) and Advanced Practice Providers (APPs -  Physician Assistants and Nurse Practitioners) who all work together to provide you with the care you need, when you need it.  We recommend signing up for the patient portal called "MyChart".  Sign up information is provided on this After Visit Summary.  MyChart is used to connect with patients for Virtual Visits (Telemedicine).  Patients are able to view lab/test results, encounter notes, upcoming appointments, etc.  Non-urgent messages can be sent to your provider as well.   To learn more about what you can do with MyChart, go to [email or web address].    Your next appointment:   6 week(s)  The format for your next appointment:   In Person  Provider:    You may see Nelva Bush, MD or one of the following Advanced Practice Providers on your designated Care Team:    Murray Hodgkins, NP  Christell Faith, PA-C  Marrianne Mood, PA-C

## 2019-12-17 ENCOUNTER — Ambulatory Visit: Payer: Medicare Other | Admitting: Physical Therapy

## 2019-12-19 ENCOUNTER — Ambulatory Visit: Payer: Medicare Other | Admitting: Physical Therapy

## 2019-12-25 ENCOUNTER — Encounter: Admission: RE | Admit: 2019-12-25 | Payer: Medicare Other | Source: Ambulatory Visit

## 2019-12-26 ENCOUNTER — Ambulatory Visit: Payer: Medicare Other | Admitting: Physical Therapy

## 2019-12-27 ENCOUNTER — Other Ambulatory Visit: Payer: Self-pay | Admitting: *Deleted

## 2019-12-27 ENCOUNTER — Inpatient Hospital Stay: Payer: Medicare Other

## 2019-12-27 ENCOUNTER — Encounter: Payer: Self-pay | Admitting: Internal Medicine

## 2019-12-27 ENCOUNTER — Inpatient Hospital Stay: Payer: Medicare Other | Admitting: Internal Medicine

## 2019-12-27 ENCOUNTER — Telehealth: Payer: Self-pay | Admitting: *Deleted

## 2019-12-27 ENCOUNTER — Other Ambulatory Visit: Payer: Self-pay

## 2019-12-27 DIAGNOSIS — C9002 Multiple myeloma in relapse: Secondary | ICD-10-CM
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
## 2019-12-27 NOTE — Progress Notes (Deleted)
[place] OFFICE PROGRESS NOTE  Patient Care Team: Center, [place] as PCP - General (General Practice) End, Harrell Gave, MD as PCP - Cardiology (Cardiology) [name], [name] [name], MD as Consulting Physician (Hematology and Oncology)  Cancer Staging No matching staging information was found for the patient.   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 No morphologic support for residual myeloma disease. Negative for minimal residual disease by [facility]-MRD flow cytometry; HOLD REVLIMID [leg swelling]  # MARCH 2021-diastolic congestive heart failure [Dr.End]  --------------------------  # 12/12- RIGHT JUGULAR DVT-x 366mn xarelto; finished April 2020; September 2020-EGD/dysphagia; Dr. AnVicente Males# Acute renal failure [Dr.Singh; Proteinuria 1.5gm/day ]; acyclovir/Asprin ------------------------------------------------------------------------------------------------------------   DIAGNOSIS: _0  MULTIPLE MYELOMA  STAGE: III/HIGH RISK ;GOALS:  CONTROL  CURRENT/MOST RECENT THERAPY-maintenance Revlimid    [place]A myeloma ([place] 11/07/2017 -  Chemotherapy   The patient had palonosetron (ALOXI) injection 0.25 mg, 0.25 mg, Intravenous,  Once, 6 of 7 cycles Administration: 0.25 mg (11/21/2017), 0.25 mg (11/29/2017), 0.25 mg (12/05/2017), 0.25 mg (12/21/2017), 0.25 mg (12/28/2017), 0.25 mg (01/04/2018), 0.25 mg (01/18/2018), 0.25 mg (01/25/2018), 0.25 mg (02/01/2018), 0.25 mg (02/15/2018), 0.25 mg (02/22/2018), 0.25 mg (03/01/2018), 0.25 mg (03/15/2018), 0.25 mg (03/22/2018), 0.25 mg (03/30/2018), 0.25 mg (04/13/2018), 0.25 mg (04/20/2018), 0.25 mg (04/27/2018) cyclophosphamide (CYTOXAN) 500 mg in sodium chloride 0.9 % 250 mL chemo infusion, 540 mg, Intravenous,  Once, 6 of 7 cycles Administration: 500 mg (11/21/2017), 500 mg (11/29/2017), 500 mg (12/05/2017), 500 mg (12/28/2017), 500 mg (01/04/2018), 500 mg (01/18/2018), 500 mg (01/25/2018), 500 mg (02/01/2018), 500 mg (02/15/2018), 500 mg (02/22/2018), 500 mg (03/01/2018), 500 mg (03/15/2018), 500 mg (03/30/2018), 500 mg (04/13/2018), 500 mg (04/20/2018), 500 mg (04/27/2018) carfilzomib (KYPROLIS) 36 mg in dextrose 5 % 50 mL chemo infusion, 20 mg/m2 = 36 mg, Intravenous, Once, 6 of 7 cycles Administration: 36 mg (11/21/2017), 36 mg (11/22/2017), 60 mg (11/29/2017), 60 mg (11/30/2017), 60 mg (12/05/2017), 60 mg (12/06/2017), 60 mg (12/21/2017), 60 mg (12/22/2017), 60 mg (12/28/2017), 60 mg (12/29/2017), 60 mg (01/04/2018), 60 mg (01/05/2018), 60 mg (01/18/2018), 60 mg (01/19/2018), 60 mg (01/25/2018), 60 mg (01/27/2018), 60 mg (02/01/2018), 60 mg (02/02/2018), 60 mg (02/15/2018), 60 mg (02/16/2018), 60 mg (02/22/2018), 60 mg (03/01/2018), 60 mg (03/02/2018), 60 mg (03/15/2018), 60 mg (03/16/2018), 60 mg (03/23/2018), 60 mg (03/30/2018), 60 mg (03/31/2018), 60 mg (04/13/2018), 60 mg (04/14/2018), 60 mg (04/20/2018), 60 mg (04/21/2018), 60 mg (04/27/2018)  for chemotherapy treatment.       INTERVAL HISTORY:  Brandi MARSCHNER80.o.  female pleasant patient above history of relapsed  multiple myeloma--most recently on maintenance Revlimid [on hold for 5 months] is here for follow-up.  Patient continues to feel poorly.  She has lost  weight.  She is currently on diuretics for CHF.  Appetite is poor.   Denies any difficulty swallowing.  Chronic mild shortness of breath chronic cough.   Review of Systems  Constitutional: Positive for malaise/fatigue. Negative for chills, diaphoresis and fever.  HENT: Negative for nosebleeds and sore throat.   Eyes: Negative for double vision.  Respiratory: Positive for cough and shortness of breath. Negative for hemoptysis and sputum production.   Cardiovascular: Positive for leg swelling. Negative for chest pain, palpitations and orthopnea.  Gastrointestinal: Negative for abdominal pain, blood in stool, constipation, heartburn and melena.  Genitourinary: Negative for dysuria, frequency and urgency.  Musculoskeletal: Positive for back pain and myalgias.  Skin: Negative.  Negative for itching and rash.  Neurological: Negative for dizziness, tingling, focal weakness, weakness and headaches.  Endo/Heme/Allergies: Does not bruise/bleed easily.  Psychiatric/Behavioral: Negative for depression. The patient has insomnia. The patient is not nervous/anxious.     PAST MEDICAL HISTORY :  Past Medical History:  Diagnosis Date  . Hypertension   . Multiple myeloma ([place])     PAST SURGICAL HISTORY :   Past Surgical History:  Procedure Laterality Date  . ESOPHAGOGASTRODUODENOSCOPY (EGD) WITH PROPOFOL N/A 03/30/2019   Procedure: ESOPHAGOGASTRODUODENOSCOPY (EGD) WITH PROPOFOL;  Surgeon: Jonathon Bellows, MD;  Location: [facility] ENDOSCOPY;  Service: Gastroenterology;  Laterality: N/A;  . IR FLUORO GUIDE PORT INSERTION RIGHT  12/16/2017    FAMILY HISTORY :   Family History  Problem Relation Age of Onset  . Pneumonia Mother   . Seizures Father     SOCIAL HISTORY:   Social History   Tobacco Use  . Smoking status: Current Some Day Smoker    Packs/day:  0.50    Types: Cigarettes  . Smokeless tobacco: Never Used  Substance Use Topics  . Alcohol use: No  . Drug use: No    ALLERGIES:  has No Known Allergies.  MEDICATIONS:  Current Outpatient Medications  Medication Sig Dispense Refill  . acyclovir (ZOVIRAX) 400 MG tablet TAKE 1 TABLET BY MOUTH ONCE DAILY TO  PREVENT  SHINGLES 30 tablet 0  . albuterol (VENTOLIN HFA) 108 (90 Base) MCG/ACT inhaler Inhale 2 puffs into the lungs every 6 (six) hours as needed for wheezing or shortness of breath. 8 g 3  . aspirin EC 325 MG tablet Take 325 mg by mouth daily.    . ergocalciferol (VITAMIN D2) 1.25 MG (50000 UT) capsule Take 1 capsule (50,000 Units total) by mouth once a week. 12 capsule 2  . feeding supplement, ENSURE ENLIVE, (ENSURE ENLIVE) LIQD Take 237 mLs by mouth 2 (two) times daily between meals. 60 Bottle 0  . Fluticasone-Salmeterol (ADVAIR DISKUS) 500-50 MCG/DOSE AEPB Inhale 1 puff into the lungs 2 (two) times daily. 1 each 3  . furosemide (LASIX) 40 MG tablet Take 1 tablet (40 mg total) by mouth daily. 90 tablet 3  . megestrol (MEGACE ES) 625 MG/5ML suspension Take 5 mLs (625 mg total) by mouth daily. 150 mL 0  . metoprolol succinate (TOPROL XL) 25 MG 24 hr tablet Take 1.5 tablets (37.5 mg total) by mouth daily. 45 tablet 3  . pantoprazole (PROTONIX) 40 MG tablet Take 1 tablet (40 mg total) by mouth daily. 90 tablet 3  . polyethylene glycol (MIRALAX / GLYCOLAX) packet Take 17 g by mouth daily as needed for mild constipation. 14 each 0  . potassium chloride SA (KLOR-CON) 20 MEQ tablet Take 1 tablet (20 mEq total) by mouth 2 (two) times daily. (Patient taking differently: Take 20  mEq by mouth 2 (two) times daily. Pt only been one a day) 60 tablet 6  . spironolactone (ALDACTONE) 25 MG tablet Take 0.5 tablets (12.5 mg total) by mouth daily. 45 tablet 3   No current facility-administered medications for this visit.   Facility-Administered Medications Ordered in Other Visits  Medication Dose  Route Frequency Provider Last Rate Last Admin  . sodium chloride flush (NS) 0.9 % injection 10 mL  10 mL Intracatheter PRN [name], MD   10 mL at 02/23/18 1400    PHYSICAL EXAMINATION: ECOG PERFORMANCE STATUS: 1 - Symptomatic but completely ambulatory  There were no vitals taken for this visit.  There were no vitals filed for this visit.  Physical Exam  Constitutional: She is oriented to person, place, and time and well-developed, well-nourished, and in no distress.  Alone. She is walking herself.  HENT:  Head: Normocephalic and atraumatic.  Poor dentition.  Eyes: Pupils are equal, round, and reactive to light.  Cardiovascular: Normal rate and regular rhythm.  Pulmonary/Chest: Effort normal and breath sounds normal. No respiratory distress. She has no wheezes.  Abdominal: Soft. Bowel sounds are normal. She exhibits no distension and no mass. There is no abdominal tenderness. There is no rebound and no guarding.  Musculoskeletal:        General: Edema present. No tenderness. Normal range of motion.     Cervical back: Normal range of motion and neck supple.  Neurological: She is alert and oriented to person, place, and time.  Skin: Skin is warm.  Psychiatric: Affect normal.   LABORATORY DATA:  I have reviewed the data as listed    Component Value Date/Time   NA 140 12/11/2019 1512   K 3.5 12/11/2019 1512   CL 110 12/11/2019 1512   CO2 23 12/11/2019 1512   GLUCOSE 117 (H) 12/11/2019 1512   BUN 13 12/11/2019 1512   CREATININE 0.37 (L) 12/11/2019 1512   CALCIUM 9.3 12/11/2019 1512   PROT 6.8 12/11/2019 1512   ALBUMIN 3.4 (L) 12/11/2019 1512   AST 23 12/11/2019 1512   ALT 21 12/11/2019 1512   ALKPHOS 229 (H) 12/11/2019 1512   BILITOT 0.8 12/11/2019 1512   GFRNONAA >60 12/11/2019 1512   GFRAA >60 12/11/2019 1512    No results found for: SPEP, UPEP  Lab Results  Component Value Date   WBC 6.7 12/11/2019   NEUTROABS 3.0 12/11/2019   HGB 8.7 (L) 12/11/2019    HCT 27.6 (L) 12/11/2019   MCV 84.7 12/11/2019   PLT 153 12/11/2019      Chemistry      Component Value Date/Time   NA 140 12/11/2019 1512   K 3.5 12/11/2019 1512   CL 110 12/11/2019 1512   CO2 23 12/11/2019 1512   BUN 13 12/11/2019 1512   CREATININE 0.37 (L) 12/11/2019 1512      Component Value Date/Time   CALCIUM 9.3 12/11/2019 1512   ALKPHOS 229 (H) 12/11/2019 1512   AST 23 12/11/2019 1512   ALT 21 12/11/2019 1512   BILITOT 0.8 12/11/2019 1512       RADIOGRAPHIC STUDIES: I have personally reviewed the radiological images as listed and agreed with the findings in the report. No results found.   ASSESSMENT & PLAN:  No problem-specific Assessment & Plan notes found for this encounter.  No orders of the defined types were placed in this encounter.  All questions were answered. The patient knows to call the clinic with any problems, questions or concerns.  [name], MD 12/27/2019 10:07 AM

## 2019-12-27 NOTE — Assessment & Plan Note (Deleted)
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 FEB 2021- [facility]- NEGATIVE [[facility],Dr.G on 11/22/2019].  Continue to hold maintenance Revlimid because of acute issues-see below  #Anemia-hemoglobin 8.7; unclear etiology; recheck iron studies LDH also recommend a PET scan for further evaluate multiple myeloma.  # DIASTOLIC HEART FAILURE-  Continue lasix; metoprolol; reviewed/ reviwed the note from cards-no clinical concerns of amyloidosis.  Prescribed 5 mg Norvasc.  # Hypokalemia potassium 3.5 stable.  Recommend e p.o. potassium twice a day.  # Bone lesions/pain stable last zometa on 11/27/2017.  Holding Zometa secondary to poor dentition.  Hold Zometa  #Weight loss-recommend Megace.;  Reviewed note from nutrition.  # DISPOSITION: ADD LDH/iron studies/ferrtiin  # Follow up end of 1st week of June- MD; labs-cbc/ cbc/cmp/mag;PET scan prior--Dr.B  

## 2019-12-28 ENCOUNTER — Inpatient Hospital Stay: Payer: Medicare Other | Admitting: Internal Medicine

## 2019-12-28 ENCOUNTER — Inpatient Hospital Stay: Payer: Medicare Other | Attending: Internal Medicine

## 2019-12-28 ENCOUNTER — Telehealth: Payer: Self-pay | Admitting: *Deleted

## 2019-12-28 DIAGNOSIS — D649 Anemia, unspecified: Secondary | ICD-10-CM | POA: Insufficient documentation

## 2019-12-28 DIAGNOSIS — C9000 Multiple myeloma not having achieved remission: Secondary | ICD-10-CM | POA: Insufficient documentation

## 2019-12-28 DIAGNOSIS — J449 Chronic obstructive pulmonary disease, unspecified: Secondary | ICD-10-CM | POA: Insufficient documentation

## 2019-12-28 DIAGNOSIS — Z79899 Other long term (current) drug therapy: Secondary | ICD-10-CM | POA: Insufficient documentation

## 2019-12-28 DIAGNOSIS — J9610 Chronic respiratory failure, unspecified whether with hypoxia or hypercapnia: Secondary | ICD-10-CM | POA: Insufficient documentation

## 2019-12-28 DIAGNOSIS — R634 Abnormal weight loss: Secondary | ICD-10-CM | POA: Insufficient documentation

## 2019-12-28 DIAGNOSIS — F17210 Nicotine dependence, cigarettes, uncomplicated: Secondary | ICD-10-CM | POA: Insufficient documentation

## 2019-12-28 DIAGNOSIS — E876 Hypokalemia: Secondary | ICD-10-CM | POA: Insufficient documentation

## 2019-12-28 DIAGNOSIS — I110 Hypertensive heart disease with heart failure: Secondary | ICD-10-CM | POA: Insufficient documentation

## 2019-12-28 NOTE — Telephone Encounter (Signed)
Patient no showed for apt. I spoke with daughter about all the apts in the cancer center. daughter read back the apt time. Contacted daughter's cell phone. unable to leave vm. mail box is full.

## 2019-12-31 ENCOUNTER — Ambulatory Visit: Payer: Medicare Other | Admitting: Physical Therapy

## 2020-01-02 ENCOUNTER — Inpatient Hospital Stay: Payer: Medicare Other

## 2020-01-02 ENCOUNTER — Ambulatory Visit: Payer: Medicare Other | Admitting: Physical Therapy

## 2020-01-02 ENCOUNTER — Encounter: Payer: Self-pay | Admitting: Internal Medicine

## 2020-01-02 ENCOUNTER — Inpatient Hospital Stay (HOSPITAL_BASED_OUTPATIENT_CLINIC_OR_DEPARTMENT_OTHER): Payer: Medicare Other | Admitting: Internal Medicine

## 2020-01-02 ENCOUNTER — Other Ambulatory Visit: Payer: Self-pay

## 2020-01-02 VITALS — BP 135/62 | HR 97 | Temp 97.0°F | Wt 116.0 lb

## 2020-01-02 DIAGNOSIS — J9610 Chronic respiratory failure, unspecified whether with hypoxia or hypercapnia: Secondary | ICD-10-CM | POA: Diagnosis not present

## 2020-01-02 DIAGNOSIS — C9000 Multiple myeloma not having achieved remission: Secondary | ICD-10-CM

## 2020-01-02 DIAGNOSIS — J449 Chronic obstructive pulmonary disease, unspecified: Secondary | ICD-10-CM

## 2020-01-02 DIAGNOSIS — D509 Iron deficiency anemia, unspecified: Secondary | ICD-10-CM

## 2020-01-02 DIAGNOSIS — Z79899 Other long term (current) drug therapy: Secondary | ICD-10-CM | POA: Diagnosis not present

## 2020-01-02 DIAGNOSIS — F17210 Nicotine dependence, cigarettes, uncomplicated: Secondary | ICD-10-CM | POA: Diagnosis not present

## 2020-01-02 DIAGNOSIS — R634 Abnormal weight loss: Secondary | ICD-10-CM | POA: Diagnosis not present

## 2020-01-02 DIAGNOSIS — D649 Anemia, unspecified: Secondary | ICD-10-CM | POA: Diagnosis not present

## 2020-01-02 DIAGNOSIS — I110 Hypertensive heart disease with heart failure: Secondary | ICD-10-CM | POA: Diagnosis not present

## 2020-01-02 DIAGNOSIS — C9002 Multiple myeloma in relapse: Secondary | ICD-10-CM

## 2020-01-02 DIAGNOSIS — E876 Hypokalemia: Secondary | ICD-10-CM | POA: Diagnosis not present

## 2020-01-02 LAB — COMPREHENSIVE METABOLIC PANEL
ALT: 26 U/L (ref 0–44)
AST: 21 U/L (ref 15–41)
Albumin: 3.7 g/dL (ref 3.5–5.0)
Alkaline Phosphatase: 123 U/L (ref 38–126)
Anion gap: 9 (ref 5–15)
BUN: 16 mg/dL (ref 8–23)
CO2: 18 mmol/L — ABNORMAL LOW (ref 22–32)
Calcium: 9 mg/dL (ref 8.9–10.3)
Chloride: 117 mmol/L — ABNORMAL HIGH (ref 98–111)
Creatinine, Ser: 0.41 mg/dL — ABNORMAL LOW (ref 0.44–1.00)
GFR calc Af Amer: >60 mL/min (ref >60)
GFR calc non Af Amer: >60 mL/min (ref >60)
Glucose, Bld: 115 mg/dL — ABNORMAL HIGH (ref 70–99)
Potassium: 2.8 mmol/L — ABNORMAL LOW (ref 3.5–5.1)
Sodium: 144 mmol/L (ref 135–145)
Total Bilirubin: 0.6 mg/dL (ref 0.3–1.2)
Total Protein: 7 g/dL (ref 6.5–8.1)

## 2020-01-02 LAB — CBC WITH DIFFERENTIAL/PLATELET
Abs Immature Granulocytes: 0 10*3/uL (ref 0.00–0.07)
Band Neutrophils: 0 %
Basophils Absolute: 0 10*3/uL (ref 0.0–0.1)
Basophils Relative: 0 %
Blasts: 0 %
Eosinophils Absolute: 0.2 10*3/uL (ref 0.0–0.5)
Eosinophils Relative: 2 %
HCT: 34.8 % — ABNORMAL LOW (ref 36.0–46.0)
Hemoglobin: 10.8 g/dL — ABNORMAL LOW (ref 12.0–15.0)
Lymphocytes Relative: 40 %
Lymphs Abs: 4.1 10*3/uL — ABNORMAL HIGH (ref 0.7–4.0)
MCH: 26.9 pg (ref 26.0–34.0)
MCHC: 31 g/dL (ref 30.0–36.0)
MCV: 86.8 fL (ref 80.0–100.0)
Metamyelocytes Relative: 0 %
Monocytes Absolute: 0.6 10*3/uL (ref 0.1–1.0)
Monocytes Relative: 6 %
Myelocytes: 0 %
Neutro Abs: 5.4 10*3/uL (ref 1.7–7.7)
Neutrophils Relative %: 52 %
Other: 0 %
Platelets: 161 10*3/uL (ref 150–400)
Promyelocytes Relative: 0 %
RBC: 4.01 MIL/uL (ref 3.87–5.11)
RDW: 14.9 % (ref 11.5–15.5)
WBC: 10.3 10*3/uL (ref 4.0–10.5)
nRBC: 0 % (ref 0.0–0.2)
nRBC: 0 /100 WBC

## 2020-01-02 LAB — MAGNESIUM: Magnesium: 1.7 mg/dL (ref 1.7–2.4)

## 2020-01-02 MED ORDER — IPRATROPIUM-ALBUTEROL 0.5-2.5 (3) MG/3ML IN SOLN
3.0000 mL | RESPIRATORY_TRACT | 3 refills | Status: DC | PRN
Start: 2020-01-02 — End: 2020-05-24

## 2020-01-02 NOTE — Assessment & Plan Note (Addendum)
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 FEB 2021- [facility]- NEGATIVE [[facility],Dr.G on 11/22/2019]. STABLE;  Continue to hold maintenance Revlimid because of acute issues-see below  # Anemia-hemoglobin 10. 3; improved/stable; monitor for now.   # chronic respiratory failure- sec to COPD/diastolic CHF- on diuretics/ order nebulizer.  Ordered Combivent.  Referral to pulmonary.  # Hypokalemia potassium- 2.8 worse; Recommend compliance with potassium twice a day.  # Bone lesions/pain stable last zometa on 11/27/2017.  Holding Zometa secondary to poor dentition.  Clinically stable.  #Weight loss-recommend Megace;  Reviewed note from nutrition.  # DISPOSITION: nebs machine- # Referral to Dr.Aleskerov/pulmonary re: COPD # Follow up 1 months MD; labs-cbc/ cbc/cmp/mag; BNP-Dr.B  

## 2020-01-02 NOTE — Progress Notes (Signed)
[place] OFFICE PROGRESS NOTE  Patient Care Team: [facility], [place] as PCP - General (General Practice) End, Harrell Gave, MD as PCP - Cardiology (Cardiology) [name], [name] [name], MD as Consulting Physician (Hematology and Oncology) Ottie Glazier, MD as Consulting Physician (Pulmonary Disease)  Cancer Staging No matching staging information was found for the patient.   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 No morphologic support for residual myeloma disease. Negative for minimal residual disease by MM-MRD flow cytometry; HOLD REVLIMID [leg swelling]  # MARCH 2021-diastolic congestive heart failure [Dr.End]  --------------------------  # 12/12- RIGHT JUGULAR DVT-x 383mn xarelto; finished April 2020; September 2020-EGD/dysphagia; Dr. AnVicente Males# Acute renal failure [Dr.Singh; Proteinuria 1.5gm/day ]; acyclovir/Asprin ------------------------------------------------------------------------------------------------------------   DIAGNOSIS: _0   MULTIPLE MYELOMA  STAGE: III/HIGH RISK ;GOALS: CONTROL  CURRENT/MOST RECENT THERAPY-maintenance Revlimid    IgA myeloma ([place] 11/07/2017 -  Chemotherapy   The patient had palonosetron (ALOXI) injection 0.25 mg, 0.25 mg, Intravenous,  Once, 6 of 7 cycles Administration: 0.25 mg (11/21/2017), 0.25 mg (11/29/2017), 0.25 mg (12/05/2017), 0.25 mg (12/21/2017), 0.25 mg (12/28/2017), 0.25 mg (01/04/2018), 0.25 mg (01/18/2018), 0.25 mg (01/25/2018), 0.25 mg (02/01/2018), 0.25 mg (02/15/2018), 0.25 mg (02/22/2018), 0.25 mg (03/01/2018), 0.25 mg (03/15/2018), 0.25 mg (03/22/2018), 0.25 mg (03/30/2018), 0.25 mg (04/13/2018), 0.25 mg (04/20/2018), 0.25 mg (04/27/2018) cyclophosphamide (CYTOXAN) 500 mg in sodium chloride 0.9 % 250 mL chemo infusion, 540 mg, Intravenous,  Once, 6 of 7 cycles Administration: 500 mg (11/21/2017), 500 mg (11/29/2017), 500 mg (12/05/2017), 500 mg (12/28/2017), 500 mg (01/04/2018), 500 mg (01/18/2018), 500 mg (01/25/2018), 500 mg (02/01/2018), 500 mg (02/15/2018), 500 mg (02/22/2018), 500 mg (03/01/2018), 500 mg (03/15/2018), 500 mg (03/30/2018), 500 mg (04/13/2018), 500 mg (04/20/2018), 500 mg (04/27/2018) carfilzomib (KYPROLIS) 36 mg in dextrose 5 % 50 mL chemo infusion, 20 mg/m2 = 36 mg, Intravenous, Once, 6 of 7 cycles Administration: 36 mg (11/21/2017), 36 mg (11/22/2017), 60 mg (11/29/2017), 60 mg (11/30/2017), 60 mg (12/05/2017), 60 mg (12/06/2017), 60 mg (12/21/2017), 60 mg (12/22/2017), 60 mg (12/28/2017), 60 mg (12/29/2017), 60 mg (01/04/2018), 60 mg (01/05/2018), 60 mg (01/18/2018), 60 mg (01/19/2018), 60 mg (01/25/2018), 60 mg (01/27/2018), 60 mg (02/01/2018), 60 mg (02/02/2018), 60 mg (02/15/2018), 60 mg (02/16/2018), 60 mg (02/22/2018), 60 mg (03/01/2018), 60 mg (03/02/2018), 60 mg (03/15/2018), 60 mg (03/16/2018), 60 mg (03/23/2018), 60 mg (03/30/2018), 60 mg (03/31/2018), 60 mg (04/13/2018), 60 mg (04/14/2018), 60 mg (04/20/2018), 60 mg (04/21/2018), 60 mg (04/27/2018)  for chemotherapy treatment.     INTERVAL HISTORY:  VaLISABETH [id number].   female pleasant patient above history of relapsed multiple myeloma--most recently not on maintenance Revlimid [on hold for 6 months] is here for follow-up.  In the  interim patient was evaluated at [facility] for worsening shortness of breath.  Diagnosis CHF/COPD exacerbation.   Patient continues to feel overall poorly.  Continues to complain of shortness of breath.  Patient states to be compliant with her diuretics.  However she is not taking her potassium as recommended.  Complains of poor appetite.  Complains of weight loss.  Review of Systems  Constitutional: Positive for malaise/fatigue. Negative for chills, diaphoresis and fever.  HENT: Negative for nosebleeds and sore throat.   Eyes: Negative for double vision.  Respiratory: Positive for cough and shortness of breath. Negative for hemoptysis and sputum production.   Cardiovascular: Positive for leg swelling. Negative for chest pain, palpitations and orthopnea.  Gastrointestinal: Negative for abdominal pain, blood in stool, constipation, heartburn and melena.  Genitourinary: Negative for dysuria, frequency and urgency.  Musculoskeletal: Positive for back pain and myalgias.  Skin: Negative.  Negative for itching and rash.  Neurological: Negative for dizziness, tingling, focal weakness, weakness and headaches.  Endo/Heme/Allergies: Does not bruise/bleed easily.  Psychiatric/Behavioral: Negative for depression. The patient has insomnia. The patient is not nervous/anxious.     PAST MEDICAL HISTORY :  Past Medical History:  Diagnosis Date   Hypertension    Multiple myeloma ([place])     PAST SURGICAL HISTORY :   Past Surgical History:  Procedure Laterality Date   ESOPHAGOGASTRODUODENOSCOPY (EGD) WITH PROPOFOL N/A 03/30/2019   Procedure: ESOPHAGOGASTRODUODENOSCOPY (EGD) WITH PROPOFOL;  Surgeon: Jonathon Bellows, MD;  Location: [facility] ENDOSCOPY;  Service: Gastroenterology;  Laterality: N/A;   IR FLUORO GUIDE PORT INSERTION RIGHT   12/16/2017    FAMILY HISTORY :   Family History  Problem Relation Age of Onset   Pneumonia Mother    Seizures Father     SOCIAL HISTORY:   Social History   Tobacco Use   Smoking status: Current Some Day Smoker    Packs/day: 0.50    Types: Cigarettes   Smokeless tobacco: Never Used  Vaping Use   Vaping Use: Never used  Substance Use Topics   Alcohol use: No   Drug use: No    ALLERGIES:  has No Known Allergies.  MEDICATIONS:  Current Outpatient Medications  Medication Sig Dispense Refill   acyclovir (ZOVIRAX) 400 MG tablet TAKE 1 TABLET BY MOUTH ONCE DAILY TO  PREVENT  SHINGLES 30 tablet 0   albuterol (VENTOLIN HFA) 108 (90 Base) MCG/ACT inhaler Inhale 2 puffs into the lungs every 6 (six) hours as needed for wheezing or shortness of breath. 8 g 3   aspirin EC 325 MG tablet Take 325 mg by mouth daily.     ergocalciferol (VITAMIN D2) 1.25 MG (50000 UT) capsule Take 1 capsule (50,000 Units total) by mouth once a week. 12 capsule 2   feeding supplement, ENSURE ENLIVE, (ENSURE ENLIVE) LIQD Take 237 mLs by mouth 2 (two) times daily between meals. 60 Bottle 0   Fluticasone-Salmeterol (ADVAIR DISKUS) 500-50 MCG/DOSE AEPB Inhale 1 puff into the lungs 2 (two) times daily. 1 each 3   furosemide (LASIX) 40 MG tablet Take 1 tablet (40 mg total) by mouth daily. 90 tablet 3   megestrol (MEGACE ES) 625 MG/5ML suspension Take 5 mLs (625 mg total) by mouth daily. 150 mL 0   metoprolol succinate (TOPROL XL) 25 MG 24 hr tablet Take 1.5 tablets (37.5 mg total) by mouth daily. 45 tablet 3   pantoprazole (PROTONIX) 40 MG tablet Take 1 tablet (40 mg total) by mouth daily. 90 tablet 3   polyethylene glycol (MIRALAX /  GLYCOLAX) packet Take 17 g by mouth daily as needed for mild constipation. 14 each 0   potassium chloride SA (KLOR-CON) 20 MEQ tablet Take 1 tablet (20 mEq total) by mouth 2 (two) times daily. (Patient taking differently: Take 20 mEq by mouth 2 (two) times daily. Pt only  been one a day) 60 tablet 6   spironolactone (ALDACTONE) 25 MG tablet Take 0.5 tablets (12.5 mg total) by mouth daily. 45 tablet 3   ipratropium-albuterol (DUONEB) 0.5-2.5 (3) MG/3ML SOLN Take 3 mLs by nebulization every 4 (four) hours as needed. 360 mL 3   No current facility-administered medications for this visit.   Facility-Administered Medications Ordered in Other Visits  Medication Dose Route Frequency Provider Last Rate Last Admin   sodium chloride flush (NS) 0.9 % injection 10 mL  10 mL Intracatheter PRN [name], MD   10 mL at 02/23/18 1400    PHYSICAL EXAMINATION: ECOG PERFORMANCE STATUS: 1 - Symptomatic but completely ambulatory  BP 135/62 (BP Location: Left Arm, Patient Position: Sitting, Cuff Size: Normal)    Pulse 97    Temp (!) 97 F (36.1 C) (Tympanic)    Wt 116 lb (52.6 kg)    SpO2 100% Comment: room air   BMI 21.22 kg/m   Filed Weights   01/02/20 1422  Weight: 116 lb (52.6 kg)    Physical Exam  Constitutional: She is oriented to person, place, and time and well-developed, well-nourished, and in no distress.  Alone. She is walking herself.  HENT:  Head: Normocephalic and atraumatic.  Poor dentition.  Eyes: Pupils are equal, round, and reactive to light.  Cardiovascular: Normal rate and regular rhythm.  Pulmonary/Chest: Effort normal and breath sounds normal. No respiratory distress. She has no wheezes.  Abdominal: Soft. Bowel sounds are normal. She exhibits no distension and no mass. There is no abdominal tenderness. There is no rebound and no guarding.  Musculoskeletal:        General: Edema present. No tenderness. Normal range of motion.     Cervical back: Normal range of motion and neck supple.  Neurological: She is alert and oriented to person, place, and time.  Skin: Skin is warm.  Psychiatric: Affect normal.   LABORATORY DATA:  I have reviewed the data as listed    Component Value Date/Time   NA 144 01/02/2020 1411   K 2.8 (L)  01/02/2020 1411   CL 117 (H) 01/02/2020 1411   CO2 18 (L) 01/02/2020 1411   GLUCOSE 115 (H) 01/02/2020 1411   BUN 16 01/02/2020 1411   CREATININE 0.41 (L) 01/02/2020 1411   CALCIUM 9.0 01/02/2020 1411   PROT 7.0 01/02/2020 1411   ALBUMIN 3.7 01/02/2020 1411   AST 21 01/02/2020 1411   ALT 26 01/02/2020 1411   ALKPHOS 123 01/02/2020 1411   BILITOT 0.6 01/02/2020 1411   GFRNONAA >60 01/02/2020 1411   GFRAA >60 01/02/2020 1411    No results found for: SPEP, UPEP  Lab Results  Component Value Date   WBC 10.3 01/02/2020   NEUTROABS 5.4 01/02/2020   HGB 10.8 (L) 01/02/2020   HCT 34.8 (L) 01/02/2020   MCV 86.8 01/02/2020   PLT 161 01/02/2020      Chemistry      Component Value Date/Time   NA 144 01/02/2020 1411   K 2.8 (L) 01/02/2020 1411   CL 117 (H) 01/02/2020 1411   CO2 18 (L) 01/02/2020 1411   BUN 16 01/02/2020 1411   CREATININE 0.41 (L) 01/02/2020  1411      Component Value Date/Time   CALCIUM 9.0 01/02/2020 1411   ALKPHOS 123 01/02/2020 1411   AST 21 01/02/2020 1411   ALT 26 01/02/2020 1411   BILITOT 0.6 01/02/2020 1411       RADIOGRAPHIC STUDIES: I have personally reviewed the radiological images as listed and agreed with the findings in the report. No results found.   ASSESSMENT & PLAN:  IgA myeloma ([place]) #Multiple myeloma stage III high-risk cytogenetics-status post KRD-VGPR ; status post autologousstem cell transplant on 06/15/18. FEB 2021- [facility]- NEGATIVE [[facility],Dr.G on 11/22/2019]. STABLE;  Continue to hold maintenance Revlimid because of acute issues-see below  # Anemia-hemoglobin 10. 3; improved/stable; monitor for now.   # chronic respiratory failure- sec to COPD/diastolic CHF- on diuretics/ order nebulizer.  Ordered Combivent.  Referral to pulmonary.  # Hypokalemia potassium- 2.8 worse; Recommend compliance with potassium twice a day.  # Bone lesions/pain stable last zometa on 11/27/2017.  Holding Zometa secondary to poor dentition.  Clinically  stable.  #Weight loss-recommend Megace;  Reviewed note from nutrition.  # DISPOSITION: nebs machine- # Referral to Dr.Aleskerov/pulmonary re: COPD # Follow up 1 months MD; labs-cbc/ cbc/cmp/mag; BNP-Dr.B   Orders Placed This Encounter  Procedures   For home use only DME Nebulizer machine    Order Specific Question:   Patient needs a nebulizer to treat with the following condition    Answer:   COPD (chronic obstructive pulmonary disease) ([place]) [562563]    Order Specific Question:   Length of Need    Answer:   Lifetime   Comprehensive metabolic panel    Standing Status:   Future    Standing Expiration Date:   01/01/2021   CBC with Differential    Standing Status:   Future    Standing Expiration Date:   01/01/2021   Magnesium    Standing Status:   Future    Standing Expiration Date:   01/01/2021   Brain natriuretic peptide    Standing Status:   Future    Standing Expiration Date:   01/01/2021   Ambulatory referral to Pulmonology    Referral Priority:   Routine    Referral Type:   Consultation    Referral Reason:   Specialty Services Required    Referred to Provider:   Ottie Glazier, MD    Requested Specialty:   Pulmonary Disease    Number of Visits Requested:   1   All questions were answered. The patient knows to call the clinic with any problems, questions or concerns.      [name], MD 01/03/2020 4:43 PM

## 2020-01-02 NOTE — Telephone Encounter (Signed)
Contacted pts daughter as I have not heard anything back from patient re: r/s her appointments.  Daughter states that she over slept last week and could not bring her mother. Then "her cellphone broke" and she couldn't call back. She states that her mother "tried to call yesterday but did not get through."  Apt made for today - labs at 2pm and apt with dr. B at 230

## 2020-01-03 LAB — KAPPA/LAMBDA LIGHT CHAINS
Kappa free light chain: 12 mg/L (ref 3.3–19.4)
Kappa, lambda light chain ratio: 0.53 (ref 0.26–1.65)
Lambda free light chains: 22.5 mg/L (ref 5.7–26.3)

## 2020-01-05 LAB — MULTIPLE MYELOMA PANEL, SERUM
Albumin SerPl Elph-Mcnc: 3.4 g/dL (ref 2.9–4.4)
Albumin/Glob SerPl: 1.3 (ref 0.7–1.7)
Alpha 1: 0.1 g/dL (ref 0.0–0.4)
Alpha2 Glob SerPl Elph-Mcnc: 0.7 g/dL (ref 0.4–1.0)
B-Globulin SerPl Elph-Mcnc: 1 g/dL (ref 0.7–1.3)
Gamma Glob SerPl Elph-Mcnc: 0.9 g/dL (ref 0.4–1.8)
Globulin, Total: 2.7 g/dL (ref 2.2–3.9)
IgA: 212 mg/dL (ref 87–352)
IgG (Immunoglobin G), Serum: 1070 mg/dL (ref 586–1602)
IgM (Immunoglobulin M), Srm: 26 mg/dL (ref 26–217)
Total Protein ELP: 6.1 g/dL (ref 6.0–8.5)

## 2020-01-07 ENCOUNTER — Telehealth: Payer: Self-pay | Admitting: *Deleted

## 2020-01-07 ENCOUNTER — Ambulatory Visit: Payer: Medicare Other | Admitting: Physical Therapy

## 2020-01-07 NOTE — Telephone Encounter (Signed)
Daughter Varney Biles called reporting that patient was given prescription for inhaled medicine, but she does not have a machine to put it in. She is asking how to get one for her. Please Arnoldo Hooker

## 2020-01-07 NOTE — Telephone Encounter (Signed)
[facility] was to reach out to the patient. I spoke to [place] last week. I spoke with daughter - given [facility]'s phone # [phone]

## 2020-01-09 ENCOUNTER — Ambulatory Visit: Payer: Medicare Other | Admitting: Physical Therapy

## 2020-01-14 ENCOUNTER — Inpatient Hospital Stay: Payer: Medicare Other

## 2020-01-14 NOTE — Progress Notes (Signed)
Nutrition Follow-up:  Patient with multiple myeloma, followed by Dr. Jacinto Reap.   Spoke with patient via phone.  Reports that her appetite is better.  Does not sound as short of breath today as on other days RD has spoken with her.  Reports that she is drinking 5 of ensure (220 calorie shakes) can't find the 350 calorie shakes.  Reports that she is eating better (chicken, hamburger, eggs, french fries).      Medications: lasix, megace  Labs: reviewed  Anthropometrics:   Weight 116 lb noted on 6/9 Patient reports weight is up to 120 lb now without worsening of swelling   NUTRITION DIAGNOSIS: Inadequate oral intake continues   INTERVENTION:  Encouraged patient to try ordering shakes online.  States that daughter can help her do that.  Encouraged her to keep eating high calorie, high protein foods.   Patient has contact information    MONITORING, EVALUATION, GOAL: weight trends, intake   NEXT VISIT: August 6 phone f/u  Ladrea Holladay B. Zenia Resides, [place], [place] Registered Dietitian [phone] (pager)

## 2020-01-25 ENCOUNTER — Other Ambulatory Visit: Payer: Self-pay

## 2020-01-25 ENCOUNTER — Ambulatory Visit (INDEPENDENT_AMBULATORY_CARE_PROVIDER_SITE_OTHER): Payer: Medicare Other | Admitting: Family

## 2020-01-25 ENCOUNTER — Encounter: Payer: Self-pay | Admitting: Family

## 2020-01-25 VITALS — BP 138/60 | HR 95 | Ht 62.0 in | Wt 127.0 lb

## 2020-01-25 DIAGNOSIS — I5032 Chronic diastolic (congestive) heart failure: Secondary | ICD-10-CM | POA: Diagnosis not present

## 2020-01-25 DIAGNOSIS — D649 Anemia, unspecified: Secondary | ICD-10-CM | POA: Diagnosis not present

## 2020-01-25 DIAGNOSIS — E876 Hypokalemia: Secondary | ICD-10-CM | POA: Diagnosis not present

## 2020-01-25 DIAGNOSIS — I10 Essential (primary) hypertension: Secondary | ICD-10-CM

## 2020-01-25 MED ORDER — SPIRONOLACTONE 25 MG PO TABS: 25.0000 mg | ORAL_TABLET | Freq: Every day | ORAL | 3 refills | Status: DC

## 2020-01-25 MED ORDER — ASPIRIN 81 MG PO TBEC: 81.0000 mg | DELAYED_RELEASE_TABLET | Freq: Every day | ORAL | 3 refills | Status: AC

## 2020-01-25 NOTE — Patient Instructions (Addendum)
Medication Instructions:  Your physician has recommended you make the following change in your medication:   TAKE Lasix 40mg  daily as prescribed  CHANGE Aspirin to 81mg  daily  CHANGE Spironolactone to 25mg  daily.  *If you need a refill on your cardiac medications before your next appointment, please call your pharmacy*   Lab Work: Your physician recommends lab work today: BMET  If you have labs (blood work) drawn today and your tests are completely normal, you will receive your results only by: [name] MyChart Message (if you have MyChart) OR . A paper copy in the mail If you have any lab test that is abnormal or we need to change your treatment, we will call you to review the results.   Testing/Procedures: Your EKG today shows normal sinus rhythm which is a good result.   Follow-Up: At [facility], you and your health needs are our priority.  As part of our continuing mission to provide you with exceptional heart care, we have created designated Provider Care Teams.  These Care Teams include your primary Cardiologist (physician) and Advanced Practice Providers (APPs -  Physician Assistants and Nurse Practitioners) who all work together to provide you with the care you need, when you need it.  We recommend signing up for the patient portal called "MyChart".  Sign up information is provided on this After Visit Summary.  MyChart is used to connect with patients for Virtual Visits (Telemedicine).  Patients are able to view lab/test results, encounter notes, upcoming appointments, etc.  Non-urgent messages can be sent to your provider as well.   To learn more about what you can do with MyChart, go to [email or web address].    Your next appointment:  4-6 weeks with Dr. Saunders Revel or APP   If you gain 3 pounds overnight of 5 pounds in one week, please call our office.   Try putting your potassium pill in applesauce to see if this makes it easier to swallow. It is very important to take your  potassium pill regularly.    Recommend trying peppermints or hard candy for your dry mouth.

## 2020-01-25 NOTE — Progress Notes (Signed)
Office Visit    Patient Name: Brandi [name] Date of Encounter: 01/25/2020  Primary Care Provider:  [facility] Primary Cardiologist:  Nelva Bush, MD Electrophysiologist:  None   Chief Complaint    Brandi [name] is a 69 y.o. female with a hx of multiple myeloma diagnosed in 2018 s/p chemo and autologous stem cell transplant (05/2018) maintenance Revlimid therapy, HTN, LE edema, DOE presents today for follow up of HFpEF.   Past Medical History    Past Medical History:  Diagnosis Date   Hypertension    Multiple myeloma ([place])    Past Surgical History:  Procedure Laterality Date   ESOPHAGOGASTRODUODENOSCOPY (EGD) WITH PROPOFOL N/A 03/30/2019   Procedure: ESOPHAGOGASTRODUODENOSCOPY (EGD) WITH PROPOFOL;  Surgeon: Jonathon Bellows, MD;  Location: [facility] ENDOSCOPY;  Service: Gastroenterology;  Laterality: N/A;   IR FLUORO GUIDE PORT INSERTION RIGHT  12/16/2017    Allergies  No Known Allergies  History of Present Illness    Brandi [name] is a 69 y.o. female with a hx of multiple myeloma diagnosed in 2018 s/p chemo and autologous stem cell transplant (05/2018) maintenance Revlimid therapy, HTN, LE edema, DOE, COPD, tobacco use. She was  last seen 12/14/19 by Dr. Saunders Revel.  Seen by Dr. Rogue Bussing in late February nothing LE edema, DOE, and cough. Her Revlimid was held in setting of LE edema. TTE late January with preserved LVEF and normal diastolic function. However, PASP noted to be at least moderately elevated. During previous myeloma treatment, had IJ DVT treated briefly with Xarelto.   When seen by Dr. Saunders Revel 09/26/19 reported 2-3 months of worsening LE edema and DOE consistently with NYHAIIIb heart failure. She had elevated BNP. Her Furosemide which was previously started by Dr. Rogue Bussing was increased, Amlodipine decreased, and Metoprolol added.   Seen in clinic 11/12/19 and Spironolactone added and education on heart failure provided as noted to be eating high  sodium foods. Seen in clinic 12/14/19 and her Amlodipine was discontinued due to hypotension.   Reports feeling "pretty good". Tells me her legs have swollen some over the last week and she "knew it was coming" as she ate some foods she shouldn't. No orthopnea, PND, increased shortness of breath.   EKGs/Labs/Other Studies Reviewed:   The following studies were reviewed today:  Echo 08/20/19 1. Left ventricular ejection fraction, by visual estimation, is 60 to  65%. The left ventricle has normal function. There is no left ventricular  hypertrophy.   2. The left ventricle has no regional wall motion abnormalities.   3. Global right ventricle has normal systolic function.The right  ventricular size is normal. No increase in right ventricular wall  thickness.   4. Left atrial size was normal.   5. Right atrial size was normal.   6. The mitral valve is normal in structure. Trivial mitral valve  regurgitation.   7. The tricuspid valve is normal in structure.   8. The tricuspid valve is normal in structure. Tricuspid valve  regurgitation is trivial.   9. The aortic valve is normal in structure. Aortic valve regurgitation is  trivial.  10. The pulmonic valve was normal in structure. Pulmonic valve  regurgitation is not visualized.  11. Moderately elevated pulmonary artery systolic pressure.   EKG:  EKG is ordered today.  The ekg ordered today demonstrates SR 95 bpm with no acute ST/T wave changes.  Recent Labs: 09/18/2019: B Natriuretic Peptide 539.0 01/02/2020: ALT 26; BUN 16; Creatinine, Ser 0.41; Hemoglobin 10.8; Magnesium 1.7; Platelets  161; Potassium 2.8; Sodium 144  Recent Lipid Panel No results found for: CHOL, TRIG, HDL, CHOLHDL, VLDL, LDLCALC, LDLDIRECT  Home Medications   Current Meds  Medication Sig   acyclovir (ZOVIRAX) 400 MG tablet TAKE 1 TABLET BY MOUTH ONCE DAILY TO  PREVENT  SHINGLES   albuterol (VENTOLIN HFA) 108 (90 Base) MCG/ACT inhaler Inhale 2 puffs into the lungs  every 6 (six) hours as needed for wheezing or shortness of breath.   aspirin EC 81 MG EC tablet Take 1 tablet (81 mg total) by mouth daily.   ergocalciferol (VITAMIN D2) 1.25 MG (50000 UT) capsule Take 1 capsule (50,000 Units total) by mouth once a week.   feeding supplement, ENSURE ENLIVE, (ENSURE ENLIVE) LIQD Take 237 mLs by mouth 2 (two) times daily between meals.   Fluticasone-Salmeterol (ADVAIR DISKUS) 500-50 MCG/DOSE AEPB Inhale 1 puff into the lungs 2 (two) times daily.   furosemide (LASIX) 40 MG tablet Take 1 tablet (40 mg total) by mouth daily.   ipratropium-albuterol (DUONEB) 0.5-2.5 (3) MG/3ML SOLN Take 3 mLs by nebulization every 4 (four) hours as needed.   megestrol (MEGACE ES) 625 MG/5ML suspension Take 5 mLs (625 mg total) by mouth daily.   metoprolol succinate (TOPROL XL) 25 MG 24 hr tablet Take 1.5 tablets (37.5 mg total) by mouth daily.   pantoprazole (PROTONIX) 40 MG tablet Take 1 tablet (40 mg total) by mouth daily.   polyethylene glycol (MIRALAX / GLYCOLAX) packet Take 17 g by mouth daily as needed for mild constipation.   potassium chloride SA (KLOR-CON) 20 MEQ tablet Take 1 tablet (20 mEq total) by mouth 2 (two) times daily. (Patient taking differently: Take 20 mEq by mouth 2 (two) times daily. Pt only been one a day)   spironolactone (ALDACTONE) 25 MG tablet Take 1 tablet (25 mg total) by mouth daily.   [DISCONTINUED] aspirin EC 325 MG tablet Take 325 mg by mouth daily.   [DISCONTINUED] spironolactone (ALDACTONE) 25 MG tablet Take 0.5 tablets (12.5 mg total) by mouth daily.    Review of Systems       All other systems reviewed and are otherwise negative except as noted above.  Physical Exam    VS:  BP 138/60 (BP Location: Left Arm, Patient Position: Sitting, Cuff Size: Normal)    Pulse 95    Ht _0  (1.575 m)    Wt 127 lb (57.6 kg)    SpO2 96%    BMI 23.23 kg/m  , BMI Body mass index is 23.23 kg/m. GEN: Well nourished, well developed, in no acute  distress. HEENT: normal. Neck: Supple, no JVD, carotid bruits, or masses. Cardiac:  RRR, no murmurs, rubs, or gallops. No clubbing, cyanosis. 1+ bilateral LE edema.  Radials//PT 2+ and equal bilaterally.  Respiratory:  Respirations regular and unlabored, clear to auscultation bilaterally. GI: Soft, nontender, nondistended, BS + x 4. MS: No deformity or atrophy. Skin: Warm and dry, no rash. Neuro:  Strength and sensation are intact. Psych: Normal affect.  Assessment & Plan    1. HFpEF/Hypokalemia - Echo 08/20/2019 with LVEF 60-65%. She reports DOE only with more than usual activity, NYHA II-III.  Weight up 11 lbs in one month with 1+ LE edema. Denies orthopnea, PND. Endorses dietary indiscretion, missed doses of Lasix, missed doses of potassium.   GDMT includes beta blocker, loop diuretic, Spironolactone. Discussed importance of taking Lasix daily. Increase Spironolactone to 65m daily.   2. HTN - Blood pressure mildly elevated. Amlodipine previously discontinued due to hypotension  and lower extremity edema. Increase Spironolactone, as above.   3. Multiple myeloma/Anemia - Follows with Dr. Rogue Bussing of oncology. Hb stable at 10.3.  4. COPD - Referred to pulmonology by Dr. Rogue Bussing. Likely contributory to DOE.   5. Hypokalemia - endorses missing doses of her potassium. Discussed importance of potasium supplementation. Increase Spironolactone, as above. BMP today.   Disposition:  Follow up in 4-6 week(s) with Dr. Saunders Revel or APP. BMP today.   Loel Dubonnet, NP 01/25/2020, 9:35 PM

## 2020-01-26 LAB — BASIC METABOLIC PANEL
BUN/Creatinine Ratio: 42 — ABNORMAL HIGH (ref 12–28)
BUN: 19 mg/dL (ref 8–27)
CO2: 17 mmol/L — ABNORMAL LOW (ref 20–29)
Calcium: 9.8 mg/dL (ref 8.7–10.3)
Chloride: 110 mmol/L — ABNORMAL HIGH (ref 96–106)
Creatinine, Ser: 0.45 mg/dL — ABNORMAL LOW (ref 0.57–1.00)
GFR calc Af Amer: 119 mL/min/{1.73_m2} (ref >59)
GFR calc non Af Amer: 103 mL/min/{1.73_m2} (ref >59)
Glucose: 84 mg/dL (ref 65–99)
Potassium: 3.9 mmol/L (ref 3.5–5.2)
Sodium: 141 mmol/L (ref 134–144)

## 2020-01-30 ENCOUNTER — Inpatient Hospital Stay: Payer: Medicare Other | Admitting: Internal Medicine

## 2020-01-30 ENCOUNTER — Inpatient Hospital Stay: Payer: Medicare Other | Attending: Internal Medicine

## 2020-01-30 NOTE — Assessment & Plan Note (Deleted)
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 FEB 2021- [facility]- NEGATIVE [[facility],Dr.G on 11/22/2019]. STABLE;  Continue to hold maintenance Revlimid because of acute issues-see below  # Anemia-hemoglobin 10. 3; improved/stable; monitor for now.   # chronic respiratory failure- sec to COPD/diastolic CHF- on diuretics/ order nebulizer.  Ordered Combivent.  Referral to pulmonary.  # Hypokalemia potassium- 2.8 worse; Recommend compliance with potassium twice a day.  # Bone lesions/pain stable last zometa on 11/27/2017.  Holding Zometa secondary to poor dentition.  Clinically stable.  #Weight loss-recommend Megace;  Reviewed note from nutrition.  # DISPOSITION: nebs machine- # Referral to Dr.Aleskerov/pulmonary re: COPD # Follow up 1 months MD; labs-cbc/ cbc/cmp/mag; BNP-Dr.B

## 2020-01-30 NOTE — Progress Notes (Deleted)
[place] OFFICE PROGRESS NOTE  Patient Care Team: [facility] as PCP - General (General Practice) End, Harrell Gave, MD as PCP - Cardiology (Cardiology) [name], [name] [name], MD as Consulting Physician (Hematology and Oncology) Ottie Glazier, MD as Consulting Physician (Pulmonary Disease)  Cancer Staging No matching staging information was found for the patient.   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 No morphologic support for residual myeloma disease. Negative for minimal residual disease by [facility]-MRD flow cytometry; HOLD REVLIMID [leg swelling]  # MARCH 2021-diastolic congestive heart failure [Dr.End]  --------------------------  # 12/12- RIGHT JUGULAR DVT-x 31mn xarelto; finished April 2020; September 2020-EGD/dysphagia; Dr. AnVicente Males# Acute renal failure [Dr.Singh; Proteinuria 1.5gm/day ]; acyclovir/Asprin ------------------------------------------------------------------------------------------------------------   DIAGNOSIS: '[ ]'   MULTIPLE MYELOMA  STAGE: III/HIGH RISK ;GOALS: CONTROL  CURRENT/MOST RECENT THERAPY-maintenance Revlimid    IgA myeloma ([facility] 11/07/2017 -  Chemotherapy   The patient had palonosetron (ALOXI) injection 0.25 mg, 0.25 mg, Intravenous,  Once, 6 of 7 cycles Administration: 0.25 mg (11/21/2017), 0.25 mg (11/29/2017), 0.25 mg (12/05/2017), 0.25 mg (12/21/2017), 0.25 mg (12/28/2017), 0.25 mg (01/04/2018), 0.25 mg (01/18/2018), 0.25 mg (01/25/2018), 0.25 mg (02/01/2018), 0.25 mg (02/15/2018), 0.25 mg (02/22/2018), 0.25 mg (03/01/2018), 0.25 mg (03/15/2018), 0.25 mg (03/22/2018), 0.25 mg (03/30/2018), 0.25 mg (04/13/2018), 0.25 mg (04/20/2018), 0.25 mg (04/27/2018) cyclophosphamide (CYTOXAN) 500 mg in sodium chloride 0.9 % 250 mL chemo infusion, 540 mg, Intravenous,  Once, 6 of 7 cycles Administration: 500 mg (11/21/2017), 500 mg (11/29/2017), 500 mg (12/05/2017), 500 mg (12/28/2017), 500 mg (01/04/2018), 500 mg (01/18/2018), 500 mg (01/25/2018), 500 mg (02/01/2018), 500 mg (02/15/2018), 500 mg (02/22/2018), 500 mg (03/01/2018), 500 mg (03/15/2018), 500 mg (03/30/2018), 500 mg (04/13/2018), 500 mg (04/20/2018), 500 mg (04/27/2018) carfilzomib (KYPROLIS) 36 mg in dextrose 5 % 50 mL chemo infusion, 20 mg/m2 = 36 mg, Intravenous, Once, 6 of 7 cycles Administration: 36 mg (11/21/2017), 36 mg (11/22/2017), 60 mg (11/29/2017), 60 mg (11/30/2017), 60 mg (12/05/2017), 60 mg (12/06/2017), 60 mg (12/21/2017), 60 mg (12/22/2017), 60 mg (12/28/2017), 60 mg (12/29/2017), 60 mg (01/04/2018), 60 mg (01/05/2018), 60 mg (01/18/2018), 60 mg (01/19/2018), 60 mg (01/25/2018), 60 mg (01/27/2018), 60 mg (02/01/2018), 60 mg (02/02/2018), 60 mg (02/15/2018), 60 mg (02/16/2018), 60 mg (02/22/2018), 60 mg (03/01/2018), 60 mg (03/02/2018), 60 mg (03/15/2018), 60 mg (03/16/2018), 60 mg (03/23/2018), 60 mg (03/30/2018), 60 mg (03/31/2018), 60 mg (04/13/2018), 60 mg (04/14/2018), 60 mg (04/20/2018), 60 mg (04/21/2018), 60 mg (04/27/2018)  for chemotherapy treatment.     INTERVAL HISTORY:  Brandi MATHERLY89.o.   female pleasant patient above history of relapsed multiple myeloma--most recently not on maintenance Revlimid [on hold for 6 months] is here for follow-up.  In the  interim patient was evaluated at [facility] for worsening shortness of breath.  Diagnosis CHF/COPD exacerbation.   Patient continues to feel overall poorly.  Continues to complain of shortness of breath.  Patient states to be compliant with her diuretics.  However she is not taking her potassium as recommended.  Complains of poor appetite.  Complains of weight loss.  Review of Systems  Constitutional: Positive for malaise/fatigue. Negative for chills, diaphoresis and fever.  HENT: Negative for nosebleeds and sore throat.   Eyes: Negative for double vision.  Respiratory: Positive for cough and shortness of breath. Negative for hemoptysis and sputum production.   Cardiovascular: Positive for leg swelling. Negative for chest pain, palpitations and orthopnea.  Gastrointestinal: Negative for abdominal pain, blood in stool, constipation, heartburn and melena.  Genitourinary: Negative for dysuria, frequency and urgency.  Musculoskeletal: Positive for back pain and myalgias.  Skin: Negative.  Negative for itching and rash.  Neurological: Negative for dizziness, tingling, focal weakness, weakness and headaches.  Endo/Heme/Allergies: Does not bruise/bleed easily.  Psychiatric/Behavioral: Negative for depression. The patient has insomnia. The patient is not nervous/anxious.     PAST MEDICAL HISTORY :  Past Medical History:  Diagnosis Date  . Hypertension   . Multiple myeloma ([place])     PAST SURGICAL HISTORY :   Past Surgical History:  Procedure Laterality Date  . ESOPHAGOGASTRODUODENOSCOPY (EGD) WITH PROPOFOL N/A 03/30/2019   Procedure: ESOPHAGOGASTRODUODENOSCOPY (EGD) WITH PROPOFOL;  Surgeon: Jonathon Bellows, MD;  Location: [facility] ENDOSCOPY;  Service: Gastroenterology;  Laterality: N/A;  . IR FLUORO GUIDE PORT INSERTION RIGHT   12/16/2017    FAMILY HISTORY :   Family History  Problem Relation Age of Onset  . Pneumonia Mother   . Seizures Father     SOCIAL HISTORY:   Social History   Tobacco Use  . Smoking status: Current Some Day Smoker    Packs/day: 0.50    Types: Cigarettes  . Smokeless tobacco: Never Used  Vaping Use  . Vaping Use: Never used  Substance Use Topics  . Alcohol use: No  . Drug use: No    ALLERGIES:  has No Known Allergies.  MEDICATIONS:  Current Outpatient Medications  Medication Sig Dispense Refill  . acyclovir (ZOVIRAX) 400 MG tablet TAKE 1 TABLET BY MOUTH ONCE DAILY TO  PREVENT  SHINGLES 30 tablet 0  . albuterol (VENTOLIN HFA) 108 (90 Base) MCG/ACT inhaler Inhale 2 puffs into the lungs every 6 (six) hours as needed for wheezing or shortness of breath. 8 g 3  . aspirin EC 81 MG EC tablet Take 1 tablet (81 mg total) by mouth daily. 90 tablet 3  . ergocalciferol (VITAMIN D2) 1.25 MG (50000 UT) capsule Take 1 capsule (50,000 Units total) by mouth once a week. 12 capsule 2  . feeding supplement, ENSURE ENLIVE, (ENSURE ENLIVE) LIQD Take 237 mLs by mouth 2 (two) times daily between meals. 60 Bottle 0  . Fluticasone-Salmeterol (ADVAIR DISKUS) 500-50 MCG/DOSE AEPB Inhale 1 puff into the lungs 2 (two) times daily. 1 each 3  . furosemide (LASIX) 40 MG tablet Take 1 tablet (40 mg total) by mouth daily. 90 tablet 3  . ipratropium-albuterol (DUONEB) 0.5-2.5 (3) MG/3ML SOLN Take 3 mLs by nebulization every 4 (four) hours as needed. 360 mL 3  . megestrol (MEGACE ES) 625 MG/5ML suspension Take 5 mLs (625 mg total) by mouth daily. 150 mL 0  . metoprolol succinate (TOPROL XL) 25 MG 24 hr tablet Take 1.5 tablets (37.5 mg total) by mouth daily. [place]  tablet 3  . pantoprazole (PROTONIX) 40 MG tablet Take 1 tablet (40 mg total) by mouth daily. 90 tablet 3  . polyethylene glycol (MIRALAX / GLYCOLAX) packet Take 17 g by mouth daily as needed for mild constipation. 14 each 0  . potassium chloride SA  (KLOR-CON) 20 MEQ tablet Take 1 tablet (20 mEq total) by mouth 2 (two) times daily. (Patient taking differently: Take 20 mEq by mouth 2 (two) times daily. Pt only been one a day) 60 tablet 6  . spironolactone (ALDACTONE) 25 MG tablet Take 1 tablet (25 mg total) by mouth daily. 90 tablet 3   No current facility-administered medications for this visit.   Facility-Administered Medications Ordered in Other Visits  Medication Dose Route Frequency Provider Last Rate Last Admin  . sodium chloride flush (NS) 0.9 % injection 10 mL  10 mL Intracatheter PRN [name], MD   10 mL at 02/23/18 1400    PHYSICAL EXAMINATION: ECOG PERFORMANCE STATUS: 1 - Symptomatic but completely ambulatory  There were no vitals taken for this visit.  There were no vitals filed for this visit.  Physical Exam Constitutional:      Comments: Alone. She is walking herself.  HENT:     Head: Normocephalic and atraumatic.  Eyes:     Pupils: Pupils are equal, round, and reactive to light.  Cardiovascular:     Rate and Rhythm: Normal rate and regular rhythm.  Pulmonary:     Effort: Pulmonary effort is normal. No respiratory distress.     Breath sounds: Normal breath sounds. No wheezing.  Abdominal:     General: Bowel sounds are normal. There is no distension.     Palpations: Abdomen is soft. There is no mass.     Tenderness: There is no abdominal tenderness. There is no guarding or rebound.  Musculoskeletal:        General: No tenderness. Normal range of motion.     Cervical back: Normal range of motion and neck supple.  Skin:    General: Skin is warm.  Neurological:     Mental Status: She is alert and oriented to person, place, and time.  Psychiatric:        Mood and Affect: Affect normal.    LABORATORY DATA:  I have reviewed the data as listed    Component Value Date/Time   NA 141 01/25/2020 1530   K 3.9 01/25/2020 1530   CL 110 (H) 01/25/2020 1530   CO2 17 (L) 01/25/2020 1530   GLUCOSE 84  01/25/2020 1530   GLUCOSE 115 (H) 01/02/2020 1411   BUN 19 01/25/2020 1530   CREATININE 0.45 (L) 01/25/2020 1530   CALCIUM 9.8 01/25/2020 1530   PROT 7.0 01/02/2020 1411   ALBUMIN 3.7 01/02/2020 1411   AST 21 01/02/2020 1411   ALT 26 01/02/2020 1411   ALKPHOS 123 01/02/2020 1411   BILITOT 0.6 01/02/2020 1411   GFRNONAA 103 01/25/2020 1530   GFRAA 119 01/25/2020 1530    No results found for: SPEP, UPEP  Lab Results  Component Value Date   WBC 10.3 01/02/2020   NEUTROABS 5.4 01/02/2020   HGB 10.8 (L) 01/02/2020   HCT 34.8 (L) 01/02/2020   MCV 86.8 01/02/2020   PLT 161 01/02/2020      Chemistry      Component Value Date/Time   NA 141 01/25/2020 1530   K 3.9 01/25/2020 1530   CL 110 (H) 01/25/2020 1530   CO2 17 (L) 01/25/2020 1530   BUN  19 01/25/2020 1530   CREATININE 0.45 (L) 01/25/2020 1530      Component Value Date/Time   CALCIUM 9.8 01/25/2020 1530   ALKPHOS 123 01/02/2020 1411   AST 21 01/02/2020 1411   ALT 26 01/02/2020 1411   BILITOT 0.6 01/02/2020 1411       RADIOGRAPHIC STUDIES: I have personally reviewed the radiological images as listed and agreed with the findings in the report. No results found.   ASSESSMENT & PLAN:  IgA myeloma ([place]) #Multiple myeloma stage III high-risk cytogenetics-status post KRD-VGPR ; status post autologousstem cell transplant on 06/15/18. FEB 2021- [facility]- NEGATIVE [[facility]Dr.G on 11/22/2019]. STABLE;  Continue to hold maintenance Revlimid because of acute issues-see below  # Anemia-hemoglobin 10. 3; improved/stable; monitor for now.   # chronic respiratory failure- sec to COPD/diastolic CHF- on diuretics/ order nebulizer.  Ordered Combivent.  Referral to pulmonary.  # Hypokalemia potassium- 2.8 worse; Recommend compliance with potassium twice a day.  # Bone lesions/pain stable last zometa on 11/27/2017.  Holding Zometa secondary to poor dentition.  Clinically stable.  #Weight loss-recommend Megace;  Reviewed note from  nutrition.  # DISPOSITION: nebs machine- # Referral to Dr.Aleskerov/pulmonary re: COPD # Follow up 1 months MD; labs-cbc/ cbc/cmp/mag; BNP-Dr.B   No orders of the defined types were placed in this encounter.  All questions were answered. The patient knows to call the clinic with any problems, questions or concerns.      [name], MD 01/30/2020 8:33 AM

## 2020-02-13 ENCOUNTER — Other Ambulatory Visit: Payer: Self-pay

## 2020-02-13 MED ORDER — METOPROLOL SUCCINATE ER 25 MG PO TB24: 37.5000 mg | ORAL_TABLET | Freq: Every day | ORAL | 3 refills | Status: DC

## 2020-02-25 ENCOUNTER — Inpatient Hospital Stay: Payer: Medicare Other | Attending: Internal Medicine

## 2020-02-25 DIAGNOSIS — F17210 Nicotine dependence, cigarettes, uncomplicated: Secondary | ICD-10-CM | POA: Insufficient documentation

## 2020-02-25 DIAGNOSIS — R1310 Dysphagia, unspecified: Secondary | ICD-10-CM | POA: Insufficient documentation

## 2020-02-25 DIAGNOSIS — I110 Hypertensive heart disease with heart failure: Secondary | ICD-10-CM | POA: Insufficient documentation

## 2020-02-25 DIAGNOSIS — C9000 Multiple myeloma not having achieved remission: Secondary | ICD-10-CM | POA: Insufficient documentation

## 2020-02-25 DIAGNOSIS — Z79899 Other long term (current) drug therapy: Secondary | ICD-10-CM | POA: Insufficient documentation

## 2020-02-25 NOTE — Progress Notes (Signed)
Nutrition Follow-up:  Patient with multiple myeloma, followed by Dr. Milderd Meager with patient via phone for nutrition follow-up.  Patient reports that her appetite is down due to having a summer cold.  Reports that she has been drinking ensure plus (4-6 per day).  She has gotten the ensure plus shakes.  Reports that she is eating some eggs, chicken and bacon.  Reports that her swelling is down.  Has been taking medications as prescribed.     Medications: reviewed  Labs: reviewed  Anthropometrics:   Weight 127 lb on 7/2 (swelling documented, 1+ edema noted)   NUTRITION DIAGNOSIS: Inadequate oral intake continues   INTERVENTION:  Patient to continue oral nutrition supplement 4-6 ensure plus daily. Provides 1400-2100 calories and 52-78 g protein.  90-> 100% of calories needs. Encouraged patient to continue to eat solid foods (low sodium) as well.   Patient has contact information    MONITORING, EVALUATION, GOAL: weight trends, intake   NEXT VISIT: Monday, Sept 13 phone f/u  Roxine Whittinghill B. Zenia Resides, [place], [place] Registered Dietitian [phone] (mobile)

## 2020-02-27 ENCOUNTER — Ambulatory Visit (INDEPENDENT_AMBULATORY_CARE_PROVIDER_SITE_OTHER): Payer: Medicare Other | Admitting: Family

## 2020-02-27 ENCOUNTER — Other Ambulatory Visit: Payer: Self-pay

## 2020-02-27 ENCOUNTER — Encounter: Payer: Self-pay | Admitting: Family

## 2020-02-27 VITALS — BP 130/64 | HR 93 | Ht 62.0 in | Wt 118.0 lb

## 2020-02-27 DIAGNOSIS — I5032 Chronic diastolic (congestive) heart failure: Secondary | ICD-10-CM

## 2020-02-27 DIAGNOSIS — I10 Essential (primary) hypertension: Secondary | ICD-10-CM | POA: Diagnosis not present

## 2020-02-27 DIAGNOSIS — J449 Chronic obstructive pulmonary disease, unspecified: Secondary | ICD-10-CM | POA: Diagnosis not present

## 2020-02-27 DIAGNOSIS — K219 Gastro-esophageal reflux disease without esophagitis: Secondary | ICD-10-CM

## 2020-02-27 DIAGNOSIS — E876 Hypokalemia: Secondary | ICD-10-CM

## 2020-02-27 NOTE — Progress Notes (Signed)
Office Visit    Patient Name: Brandi [name] Date of Encounter: 02/27/2020  Primary Care Provider:  [facility] Primary Cardiologist:  Nelva Bush, MD Electrophysiologist:  None   Chief Complaint    Brandi [name] is a 69 y.o. female with a hx of multiple myeloma diagnosed in 2018 s/p chemo and autologous stem cell transplant (05/2018) maintenance Revlimid therapy, HTN, LE edema, DOE presents today for follow up of HFpEF.   Past Medical History    Past Medical History:  Diagnosis Date  . Hypertension   . Multiple myeloma [facility])    Past Surgical History:  Procedure Laterality Date  . ESOPHAGOGASTRODUODENOSCOPY (EGD) WITH PROPOFOL N/A 03/30/2019   Procedure: ESOPHAGOGASTRODUODENOSCOPY (EGD) WITH PROPOFOL;  Surgeon: Jonathon Bellows, MD;  Location: [facility] ENDOSCOPY;  Service: Gastroenterology;  Laterality: N/A;  . IR FLUORO GUIDE PORT INSERTION RIGHT  12/16/2017    Allergies  No Known Allergies  History of Present Illness    Brandi [name] is a 69 y.o. female with a hx of multiple myeloma diagnosed in 2018 s/p chemo and autologous stem cell transplant (05/2018) maintenance Revlimid therapy, HTN, LE edema, DOE, COPD, tobacco use. She was  last seen 01/25/20.   Seen by Dr. Rogue Bussing in late February nothing LE edema, DOE, and cough. Her Revlimid was held in setting of LE edema. TTE late January with preserved LVEF and normal diastolic function. However, PASP noted to be at least moderately elevated. During previous myeloma treatment, had IJ DVT treated briefly with Xarelto.   When seen by Dr. Saunders Revel 09/26/19 reported 2-3 months of worsening LE edema and DOE consistently with NYHAIIIb heart failure. She had elevated BNP. Her Furosemide which was previously started by Dr. Rogue Bussing was increased, Amlodipine decreased, and Metoprolol added.   Seen in clinic 11/12/19 and Spironolactone added and education on heart failure provided as noted to be eating high sodium  foods. Seen in clinic 12/14/19 and her Amlodipine was discontinued due to hypotension. Seen in clinic 01/25/20. Noted some edema as she ate some high sodium foods and had been taking her diuretic inconsistently. She was encouraged to take her diuretic daily and her Spironolactone was increased to 78m daily.   She reports feeling overall well.  She is down 9 pounds from her clinic visit 1 month ago.  Reports compliance with her Lasix as well as spironolactone.  Does endorse indiscretion in her diet but she has been eating tomato sandwiches salt.  No formal exercise routine but does try to walk to the mailbox daily for exercise.  Reports no chest pain, pressure, tightness.  Reports no shortness of breath at rest but endorse continued dyspnea on exertion.  Tells me this improves after utilizing her nebulizer treatments which she is doing 4 times per day.  She also notes that she is out of her inhalers as well as Protonix and I will send to her prescribing provider at message.  We reviewed the recommendation from her oncologist for referral to pulmonology which I agree with.  She has not yet heard from their office and will be provided with the phone number.  EKGs/Labs/Other Studies Reviewed:   The following studies were reviewed today:  Echo 08/20/19 1. Left ventricular ejection fraction, by visual estimation, is 60 to  65%. The left ventricle has normal function. There is no left ventricular  hypertrophy.   2. The left ventricle has no regional wall motion abnormalities.   3. Global right ventricle has normal systolic function.The  right  ventricular size is normal. No increase in right ventricular wall  thickness.   4. Left atrial size was normal.   5. Right atrial size was normal.   6. The mitral valve is normal in structure. Trivial mitral valve  regurgitation.   7. The tricuspid valve is normal in structure.   8. The tricuspid valve is normal in structure. Tricuspid valve  regurgitation is  trivial.   9. The aortic valve is normal in structure. Aortic valve regurgitation is  trivial.  10. The pulmonic valve was normal in structure. Pulmonic valve  regurgitation is not visualized.  11. Moderately elevated pulmonary artery systolic pressure.   EKG:  EKG is ordered today.  The ekg ordered today demonstrates NSr 93 bpm with no acute ST/T wave changes.   Recent Labs: 09/18/2019: B Natriuretic Peptide 539.0 01/02/2020: ALT 26; Hemoglobin 10.8; Magnesium 1.7; Platelets 161 01/25/2020: BUN 19; Creatinine, Ser 0.45; Potassium 3.9; Sodium 141  Recent Lipid Panel No results found for: CHOL, TRIG, HDL, CHOLHDL, VLDL, LDLCALC, LDLDIRECT  Home Medications   Current Meds  Medication Sig  . acyclovir (ZOVIRAX) 400 MG tablet TAKE 1 TABLET BY MOUTH ONCE DAILY TO  PREVENT  SHINGLES  . aspirin EC 81 MG EC tablet Take 1 tablet (81 mg total) by mouth daily.  . ergocalciferol (VITAMIN D2) 1.25 MG (50000 UT) capsule Take 1 capsule (50,000 Units total) by mouth once a week.  . feeding supplement, ENSURE ENLIVE, (ENSURE ENLIVE) LIQD Take 237 mLs by mouth 2 (two) times daily between meals.  . furosemide (LASIX) 40 MG tablet Take 1 tablet (40 mg total) by mouth daily.  [name] ipratropium-albuterol (DUONEB) 0.5-2.5 (3) MG/3ML SOLN Take 3 mLs by nebulization every 4 (four) hours as needed.  . megestrol (MEGACE ES) 625 MG/5ML suspension Take 5 mLs (625 mg total) by mouth daily.  . metoprolol succinate (TOPROL XL) 25 MG 24 hr tablet Take 1.5 tablets (37.5 mg total) by mouth daily.  . polyethylene glycol (MIRALAX / GLYCOLAX) packet Take 17 g by mouth daily as needed for mild constipation.  . potassium chloride SA (KLOR-CON) 20 MEQ tablet Take 1 tablet (20 mEq total) by mouth 2 (two) times daily.  [name] spironolactone (ALDACTONE) 25 MG tablet Take 1 tablet (25 mg total) by mouth daily.    Review of Systems   Review of Systems  Constitutional: Negative for chills, fever and malaise/fatigue.  Cardiovascular:  Positive for dyspnea on exertion. Negative for chest pain, irregular heartbeat, leg swelling, near-syncope, orthopnea, palpitations and syncope.  Respiratory: Negative for cough, shortness of breath and wheezing.   Gastrointestinal: Negative for melena, nausea and vomiting.  Genitourinary: Negative for hematuria.  Neurological: Negative for dizziness, light-headedness and weakness.   All other systems reviewed and are otherwise negative except as noted above.  Physical Exam    VS:  BP 130/64 (BP Location: Left Arm, Patient Position: Sitting, Cuff Size: Normal)   Pulse 93   Ht _0  (1.575 m)   Wt 118 lb (53.5 kg)   SpO2 99%   BMI 21.58 kg/m  , BMI Body mass index is 21.58 kg/m. GEN: Well nourished, well developed, in no acute distress. HEENT: normal. Neck: Supple, no JVD, carotid bruits, or masses. Cardiac:  RRR, no murmurs, rubs, or gallops. No clubbing, cyanosis.  Nonpitting bilateral LE edema.  Radials//PT 2+ and equal bilaterally.  Respiratory:  Respirations regular and unlabored, clear to auscultation bilaterally. GI: Soft, nontender, nondistended, BS + x 4. MS: No deformity or atrophy.  Skin: Warm and dry, no rash. Neuro:  Strength and sensation are intact. Psych: Normal affect.  Assessment & Plan    1. HFpEF/Hypokalemia - Echo 08/20/2019 with LVEF 60-65%, no LVH, no RWMA, trivial MR. 01/25/2020 potassium 3.9, GFR 119. Weight down 9 lbs over last month with compliance with diuretic therapies.  Stable DOE with more than usual activity. Likely multifactorial deconditioning, COPD.  NYHA II.   Denies orthopnea, PND. GDMT includes beta blocker, loop diuretic, Spironolactone.  Continue present potassium dose.  2. HTN - Amlodipine previously discontinued due to hypotension and lower extremity edema.  Blood pressure reasonably controlled continue Lasix 40 mg daily, spironolactone 25 mg daily, Toprol 37.5 mg daily.  3. Multiple myeloma/Anemia - Follows with Dr. Rogue Bussing of oncology. Hb  stable at 10.3.  4. COPD - . Likely contributory to DOE.  She has been referred to Dr. Lanney Gins by Dr. Rogue Bussing, but not yet been scheduled an appointment.  She was provided their office phone number and encouraged to call as much of her dyspnea is likely due to her COPD.  Tells me she is about out of her inhalers and needs refill, will route note to prescribing provider.  5. GERD -well-controlled on Protonix.  Tells me she needs refill and will send note to prescribing provider.  Disposition:  Follow up in 3 months with Dr. Saunders Revel or APP.  Loel Dubonnet, NP 02/27/2020, 3:36 PM

## 2020-02-27 NOTE — Patient Instructions (Addendum)
Medication Instructions:  No medication changes today.   We will send a message to Dr. Aletha Halim office that you need a refill of your Protonix and inhalers.   *If you need a refill on your cardiac medications before your next appointment, please call your pharmacy*   Lab Work: No lab work today.   Testing/Procedures: Your EKG today shows normal sinus rhythm.    Follow-Up: At [facility], you and your health needs are our priority.  As part of our continuing mission to provide you with exceptional heart care, we have created designated Provider Care Teams.  These Care Teams include your primary Cardiologist (physician) and Advanced Practice Providers (APPs -  Physician Assistants and Nurse Practitioners) who all work together to provide you with the care you need, when you need it.  We recommend signing up for the patient portal called "MyChart".  Sign up information is provided on this After Visit Summary.  MyChart is used to connect with patients for Virtual Visits (Telemedicine).  Patients are able to view lab/test results, encounter notes, upcoming appointments, etc.  Non-urgent messages can be sent to your provider as well.   To learn more about what you can do with MyChart, go to [email or web address].    Your next appointment:   3 month(s)  The format for your next appointment:   In Person  Provider:    You may see Nelva Bush, MD or one of the following Advanced Practice Providers on your designated Care Team:    Murray Hodgkins, NP  Christell Faith, PA-C  Marrianne Mood, PA-C  Laurann Montana, NP  Other Instructions  You have previously been referred to pulmonology (a lung doctor) by Dr. Carie Caddy. You were referred to Dr. Lanney Gins. Would recommend calling their office to schedule appointment if you have not yet been contacted. Their phone number is [phone].

## 2020-03-03 ENCOUNTER — Emergency Department
Admission: EM | Admit: 2020-03-03 | Discharge: 2020-03-03 | Discharge status: Against Medical Advice | Payer: Medicare Other

## 2020-03-03 ENCOUNTER — Telehealth: Payer: Self-pay | Admitting: *Deleted

## 2020-03-03 ENCOUNTER — Telehealth: Payer: Self-pay | Admitting: Family

## 2020-03-03 NOTE — Telephone Encounter (Signed)
Please call to discuss pulmonary referral. Patient daughter states the doctor we referred does not take patient's insurance.

## 2020-03-03 NOTE — Telephone Encounter (Signed)
Dr. B per note in ER. "LWBS before Triage"

## 2020-03-03 NOTE — Telephone Encounter (Signed)
DPR on file. Spoke with the patients daughter Varney Biles. Adv the [facility] that the referral to pulmonology was placed by the patients oncologist Dr. Rogue Bussing. Varney Biles will contact his office regarding the referral.

## 2020-03-03 NOTE — Telephone Encounter (Signed)
EMS gave patient 2 albuterol nebulizer treatments today. Patient declined EMS transport per daughter.  Patient did not go to pulmonary referral b/c the office "doesn't take [place]." She was informed today that the pulmonologist that Dr. B referred her to does not take her insurance. NOTE: referral was entered on January 02, 2020. I asked the patient if she has seen her PCP. The daughter stated that Dr. Rogue Bussing was her primary care doctor. I explained to pt and her daughter that Dr. Jacinto Reap was her oncologist no her primary care provider. The patient stated that she has seen the providers [facility] in the past, but has not had follow-up with these providers in some time. I informed patient that she has missed apts in our clinic for her pet scan and follow-up with Dr. Jacinto Reap in July. She has not yet rescheduled. She stated that she didn't miss any appointment b/c she didn't know about these appointments. I informed her in order for Dr.B to prescribe her an inhaler, she would need to come in to the office for an appointment. Discussed that she left without being seen today in the ER. Patient stated, "no problem. I will get what I need. I'm going to drive to [facility] for my care and take my care somewhere else."  Pt hung up on the nurse.

## 2020-03-03 NOTE — Telephone Encounter (Signed)
Daughter called reporting that she had to call EMS this morning due to patient having difficulty breathing. They gave her a staright Albuterol neb and it helped. She is asking if Dr B ill change her Duo Neb to Albuterol because it did not help her. She states that she is driving patient to ER herself now that she is a little better. Please advise

## 2020-03-03 NOTE — Telephone Encounter (Signed)
Dr. Jacinto Reap - pt has also missed multiple apts in the clinic with you.

## 2020-03-04 ENCOUNTER — Telehealth: Payer: Self-pay | Admitting: Internal Medicine

## 2020-03-04 NOTE — Telephone Encounter (Signed)
Left a voicemail for the patient's daughter Ms. Galen Manila will discuss her mother shortness of breath issues.  Left a message to call us back.

## 2020-03-04 NOTE — Telephone Encounter (Deleted)
Per Careeverywhere chart - patient was evaluated in the [facility] ER for her shortness of breath- "Will discharge patient to continue inhaler at home and complete a 5-day course of steroids. Did discuss with patient need for close follow-up with PCP for reevaluation."

## 2020-03-04 NOTE — Telephone Encounter (Signed)
Per Dr. Jacinto Reap- He attempted to reach the patient last evening. No answer via telephone.

## 2020-03-05 ENCOUNTER — Telehealth: Payer: Self-pay | Admitting: *Deleted

## 2020-03-05 ENCOUNTER — Telehealth: Payer: Self-pay | Admitting: Internal Medicine

## 2020-03-05 ENCOUNTER — Encounter: Payer: Self-pay | Admitting: *Deleted

## 2020-03-05 DIAGNOSIS — C9000 Multiple myeloma not having achieved remission: Secondary | ICD-10-CM

## 2020-03-05 MED ORDER — ALBUTEROL SULFATE HFA 108 (90 BASE) MCG/ACT IN AERS: 2.0000 | INHALATION_SPRAY | Freq: Four times a day (QID) | RESPIRATORY_TRACT | 3 refills | Status: DC | PRN

## 2020-03-05 NOTE — Telephone Encounter (Signed)
Spoke to patient's daughter-regarding patient's worsening shortness of breath likely from COPD/CHF.  Discussed at length that I would therefore respiratory care to either PCP/cardiology or pulmonology.  And I will be managing her multiple myeloma.   Given the immediate need for respiratory management would recommend evaluation symptom management clinic tomorrow.  The daughter will call the office in the morning regards the time with appointment.   I sent a refill of albuterol to the pharmacy.

## 2020-03-05 NOTE — Telephone Encounter (Signed)
Patient just phoned and said that she needs to see Dr. Jacinto Reap. She reports "not holding her water" and her inhaler ran out this morning. She said that the ambulance was at her home on Monday morning and that something was not right with her breathing machine. Message sent to MD team requesting they phone patient.

## 2020-03-05 NOTE — Telephone Encounter (Signed)
-----   Message from Liana Crocker sent at 03/05/2020  2:24 PM EDT ----- Patient just phoned and said that she needs to see Dr. B really badly. She reports "not holding her water" and peeing on herself, her inhaler ran out this morning, and sounded like she was gasping on the phone. She said that the ambulance was at her home on Monday morning and that something was not right with her breathing machine. Please call her back at [phone]. Thanks!

## 2020-03-05 NOTE — Telephone Encounter (Signed)
Pt did not get seen in ER at [facility]. Note reviewed by RN was from May.

## 2020-03-05 NOTE — Telephone Encounter (Signed)
Spoke with Dr. Rogue Bussing. Pt needs to be evaluated in [facility] tomorrow with her previously order labs.  (cbc, metc, mag, BNP, myeloma mult. And kappa light chain.). Dr. Jacinto Reap will have Lauren evaluate the patient first before a chest xray is ordered. Pt has h/o copd; chronic smoker. She has not kept past apts with Dr. Rogue Bussing for pet scan/md.  Pt states that she did not get any assistance at [facility]. She stated that she left the ER and does not have any inhalers or medications for her shortness of breath. Patient also left ER at [facility] and was not seen. Pt stated that she was not seen because she had 12 people in front of her. She left w/o being seen.  apts given to patient for tom- 11 am for lab/ and see Np after the labs. Sharyn Lull, RN given hand off for this patient.

## 2020-03-06 ENCOUNTER — Inpatient Hospital Stay (HOSPITAL_BASED_OUTPATIENT_CLINIC_OR_DEPARTMENT_OTHER): Payer: Medicare Other | Admitting: Nurse Practitioner

## 2020-03-06 ENCOUNTER — Inpatient Hospital Stay: Payer: Medicare Other

## 2020-03-06 ENCOUNTER — Other Ambulatory Visit: Payer: Self-pay

## 2020-03-06 ENCOUNTER — Telehealth: Payer: Self-pay | Admitting: *Deleted

## 2020-03-06 VITALS — BP 128/74 | HR 105 | Temp 98.1°F | Resp 20 | Wt 118.4 lb

## 2020-03-06 DIAGNOSIS — C9000 Multiple myeloma not having achieved remission: Secondary | ICD-10-CM

## 2020-03-06 DIAGNOSIS — R1310 Dysphagia, unspecified: Secondary | ICD-10-CM | POA: Diagnosis not present

## 2020-03-06 DIAGNOSIS — I110 Hypertensive heart disease with heart failure: Secondary | ICD-10-CM | POA: Diagnosis not present

## 2020-03-06 DIAGNOSIS — R0602 Shortness of breath: Secondary | ICD-10-CM | POA: Diagnosis not present

## 2020-03-06 DIAGNOSIS — J449 Chronic obstructive pulmonary disease, unspecified: Secondary | ICD-10-CM

## 2020-03-06 DIAGNOSIS — F17210 Nicotine dependence, cigarettes, uncomplicated: Secondary | ICD-10-CM | POA: Diagnosis not present

## 2020-03-06 DIAGNOSIS — C9002 Multiple myeloma in relapse: Secondary | ICD-10-CM

## 2020-03-06 DIAGNOSIS — Z79899 Other long term (current) drug therapy: Secondary | ICD-10-CM | POA: Diagnosis not present

## 2020-03-06 LAB — COMPREHENSIVE METABOLIC PANEL
ALT: 32 U/L (ref 0–44)
AST: 33 U/L (ref 15–41)
Albumin: 3.6 g/dL (ref 3.5–5.0)
Alkaline Phosphatase: 181 U/L — ABNORMAL HIGH (ref 38–126)
Anion gap: 10 (ref 5–15)
BUN: 13 mg/dL (ref 8–23)
CO2: 21 mmol/L — ABNORMAL LOW (ref 22–32)
Calcium: 9 mg/dL (ref 8.9–10.3)
Chloride: 110 mmol/L (ref 98–111)
Creatinine, Ser: 0.38 mg/dL — ABNORMAL LOW (ref 0.44–1.00)
GFR calc Af Amer: >60 mL/min (ref >60)
GFR calc non Af Amer: >60 mL/min (ref >60)
Glucose, Bld: 111 mg/dL — ABNORMAL HIGH (ref 70–99)
Potassium: 3 mmol/L — ABNORMAL LOW (ref 3.5–5.1)
Sodium: 141 mmol/L (ref 135–145)
Total Bilirubin: 0.8 mg/dL (ref 0.3–1.2)
Total Protein: 6.9 g/dL (ref 6.5–8.1)

## 2020-03-06 LAB — CBC WITH DIFFERENTIAL/PLATELET
Abs Immature Granulocytes: 0.01 10*3/uL (ref 0.00–0.07)
Basophils Absolute: 0 10*3/uL (ref 0.0–0.1)
Basophils Relative: 0 %
Eosinophils Absolute: 0.3 10*3/uL (ref 0.0–0.5)
Eosinophils Relative: 4 %
HCT: 27.4 % — ABNORMAL LOW (ref 36.0–46.0)
Hemoglobin: 8.7 g/dL — ABNORMAL LOW (ref 12.0–15.0)
Immature Granulocytes: 0 %
Lymphocytes Relative: 44 %
Lymphs Abs: 2.4 10*3/uL (ref 0.7–4.0)
MCH: 26 pg (ref 26.0–34.0)
MCHC: 31.8 g/dL (ref 30.0–36.0)
MCV: 82 fL (ref 80.0–100.0)
Monocytes Absolute: 0.5 10*3/uL (ref 0.1–1.0)
Monocytes Relative: 10 %
Neutro Abs: 2.4 10*3/uL (ref 1.7–7.7)
Neutrophils Relative %: 42 %
Platelets: 182 10*3/uL (ref 150–400)
RBC: 3.34 MIL/uL — ABNORMAL LOW (ref 3.87–5.11)
RDW: 13.9 % (ref 11.5–15.5)
WBC: 5.6 10*3/uL (ref 4.0–10.5)
nRBC: 0 % (ref 0.0–0.2)

## 2020-03-06 LAB — MAGNESIUM: Magnesium: 1.5 mg/dL — ABNORMAL LOW (ref 1.7–2.4)

## 2020-03-06 LAB — BRAIN NATRIURETIC PEPTIDE: B Natriuretic Peptide: 447.8 pg/mL — ABNORMAL HIGH (ref 0.0–100.0)

## 2020-03-06 NOTE — Progress Notes (Signed)
Patient states she has COPD. Breathing gets worse at nighttime. Sleeps with 2 pillows. Has duoneb at home when EMS came they gave her straight albuterol and that worked a whole lot better. Needs refill on advair and pro air /albuterol inhaler. Was given a referral to pulmonology and they don't take her insurance [place] access. Denies apin. Appetite up and down. Admits to missing several follow up appts. [name] is with pt today.

## 2020-03-06 NOTE — Telephone Encounter (Signed)
Left VM per Kesha's request; PET scan is scheduled for Monday 03/17/20 at 9 am. Left detailed instructions NPO after MN, to eat a low carb dinner night before. She can take morning pills with a sip of water. Report to [place] at [facility],  check in at registration desk for PET.

## 2020-03-06 NOTE — Progress Notes (Signed)
Symptom Management [place]  [phone] [phone]  Patient Care Team: [facility], [place] as PCP - General (General Practice) End, Harrell Gave, MD as PCP - Cardiology (Cardiology) [name], [name] [name], MD as Consulting Physician (Hematology and Oncology) Ottie Glazier, MD as Consulting Physician (Pulmonary Disease)   Name of the patient: Brandi [name]  [id number]  1950-07-30   Date of visit: 03/06/20  Diagnosis-multiple myeloma  Chief complaint/ Reason for visit- shortness of breath  Heme/Onc history:  Oncology History Overview Note  # SEP 2018- MULTIPLE MYELOMA IgALamda [2.5 gm/dl; K/L= 88/1298]; STAGE III [beta 2 microglobulin=5.5] [presented with acute renal failure; anemia; NO hypercalcemia; Skeletal survey-Normal]; BMBx- 45% plasma cells; FISH-POSITIVE 11:14 translocation.[STANDARD-high RISK]/cyto-Normal; SEP 2018- PET- L3 posterior element lesion.   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 No morphologic support for residual myeloma disease. Negative for minimal residual disease by MM-MRD flow cytometry; HOLD REVLIMID [leg swelling]  # MARCH 2021-diastolic congestive heart failure [Dr.End]  --------------------------  # 12/12- RIGHT JUGULAR DVT-x 371mn xarelto; finished April 2020; September 2020-EGD/dysphagia; Dr. AnVicente Males# Acute renal failure  [Dr.Singh; Proteinuria 1.5gm/day ]; acyclovir/Asprin ------------------------------------------------------------------------------------------------------------   DIAGNOSIS: _0  MULTIPLE MYELOMA  STAGE: III/HIGH RISK ;GOALS: CONTROL  CURRENT/MOST RECENT THERAPY-maintenance Revlimid    IgA myeloma ([facility] 11/07/2017 -  Chemotherapy   The patient had palonosetron (ALOXI) injection 0.25 mg, 0.25 mg, Intravenous,  Once, 6 of 7 cycles Administration: 0.25 mg (11/21/2017), 0.25 mg (11/29/2017), 0.25 mg (12/05/2017), 0.25 mg (12/21/2017), 0.25 mg (12/28/2017), 0.25 mg (01/04/2018), 0.25 mg (01/18/2018), 0.25 mg (01/25/2018), 0.25 mg (02/01/2018), 0.25 mg (02/15/2018), 0.25 mg (02/22/2018), 0.25 mg (03/01/2018), 0.25 mg (03/15/2018), 0.25 mg (03/22/2018), 0.25 mg (03/30/2018), 0.25 mg (04/13/2018), 0.25 mg (04/20/2018), 0.25 mg (04/27/2018) cyclophosphamide (CYTOXAN) 500 mg in sodium chloride 0.9 % 250 mL chemo infusion, 540 mg, Intravenous,  Once, 6 of 7 cycles Administration: 500 mg (11/21/2017), 500 mg (11/29/2017), 500 mg (12/05/2017), 500 mg (12/28/2017), 500 mg (01/04/2018), 500 mg (01/18/2018), 500 mg (01/25/2018), 500 mg (02/01/2018), 500 mg (02/15/2018), 500 mg (02/22/2018), 500 mg (03/01/2018), 500 mg (03/15/2018), 500 mg (03/30/2018), 500 mg (04/13/2018), 500 mg (04/20/2018), 500 mg (04/27/2018) carfilzomib (KYPROLIS) 36 mg in dextrose 5 % 50 mL chemo infusion, 20 mg/m2 = 36 mg, Intravenous, Once, 6 of 7 cycles Administration: 36 mg (11/21/2017), 36 mg (11/22/2017), 60 mg (11/29/2017), 60 mg (11/30/2017), 60 mg (12/05/2017), 60 mg (12/06/2017), 60 mg (12/21/2017), 60 mg (12/22/2017), 60 mg (12/28/2017), 60 mg (12/29/2017), 60 mg (01/04/2018), 60 mg (01/05/2018), 60 mg (01/18/2018), 60 mg (01/19/2018), 60 mg (01/25/2018), 60 mg (01/27/2018), 60 mg (02/01/2018), 60 mg (02/02/2018), 60 mg (02/15/2018), 60 mg (02/16/2018), 60 mg (02/22/2018), 60 mg (03/01/2018), 60 mg (03/02/2018), 60 mg (03/15/2018), 60 mg (03/16/2018), 60 mg (03/23/2018), 60 mg (03/30/2018), 60 mg (03/31/2018),  60 mg (04/13/2018), 60 mg (04/14/2018), 60 mg (04/20/2018), 60 mg (04/21/2018), 60 mg (04/27/2018)  for chemotherapy treatment.      Interval history-patient has history significant for asthma and  CHF as well as above history of multiple myeloma.  She reported worsening shortness of breath and was referred to ER for this.  She went to [facility] on 03/03/2020 with reports of asthma attack not improved by her inhaler.  She left without being seen due to wait times.  Spoke with Dr.[name] recommendation for cardiology and/for pulmonology to manage her chronic illness.  Oncology manages her multiple myeloma.  Albuterol inhaler was refilled.  Today, she reports progressive shortness of breath over the past few weeks to months.  Weight is not increased.  Shortness of breath primarily with exertion.  Improved with inhalers and breathing machine at home. Has chronic cough which is no worse. No fevers. Chronic aches and pains but none new. No chest pain. No nausea or vomiting. Generally feels at baseline but ongoing shortness of breath and cough is bothersome to her and she wants to get something done about it. Hasn't picked up new inhaler yet.   ECOG FS:2 - Symptomatic, <50% confined to bed  Review of systems- Review of Systems  Constitutional: Negative for chills, fever, malaise/fatigue and weight loss.  HENT: Negative for hearing loss, nosebleeds, sore throat and tinnitus.   Eyes: Negative for blurred vision and double vision.  Respiratory: Positive for cough and shortness of breath. Negative for hemoptysis and wheezing.   Cardiovascular: Positive for leg swelling. Negative for chest pain and palpitations.  Gastrointestinal: Negative for abdominal pain, blood in stool, constipation, diarrhea, melena, nausea and vomiting.  Genitourinary: Negative for dysuria and urgency.  Musculoskeletal: Negative for back pain, falls, joint pain and myalgias.  Skin: Negative for itching and rash.  Neurological: Negative for  dizziness, tingling, sensory change, loss of consciousness, weakness and headaches.  Endo/Heme/Allergies: Negative for environmental allergies. Does not bruise/bleed easily.  Psychiatric/Behavioral: Negative for depression. The patient is not nervous/anxious and does not have insomnia.      Current treatment- revlimid on hold  No Known Allergies  Past Medical History:  Diagnosis Date  . At risk for cardiac dysfunction   . Hypertension   . Hypokalemia   . IgA myeloma ([place])   . Multiple myeloma ([place])   . Red blood cell antibody positive, compatible PRBC difficult to obtain   . S/P autologous bone marrow transplantation [facility])     Past Surgical History:  Procedure Laterality Date  . ESOPHAGOGASTRODUODENOSCOPY (EGD) WITH PROPOFOL N/A 03/30/2019   Procedure: ESOPHAGOGASTRODUODENOSCOPY (EGD) WITH PROPOFOL;  Surgeon: Jonathon Bellows, MD;  Location: [facility] ENDOSCOPY;  Service: Gastroenterology;  Laterality: N/A;  . IR FLUORO GUIDE PORT INSERTION RIGHT  12/16/2017    Social History   Socioeconomic History  . Marital status: Single    Spouse name: Not on file  . Number of children: Not on file  . Years of education: Not on file  . Highest education level: Not on file  Occupational History  . Not on file  Tobacco Use  . Smoking status: Current Some Day Smoker    Packs/day: 0.50    Types: Cigarettes  . Smokeless tobacco: Never Used  . Tobacco comment: 2 cigarettes QOD  Vaping Use  . Vaping Use: Never used  Substance and Sexual Activity  . Alcohol use: No  . Drug use: No  . Sexual activity: Not on file  Other Topics Concern  . Not on file  Social History Narrative   Lives at home with family. Independent at baseline   Social Determinants of Health   Financial Resource Strain:   . Difficulty of Paying Living  Expenses:   Food Insecurity:   . Worried About Charity fundraiser in the Last Year:   . Arboriculturist in the Last Year:   Transportation Needs:   . Film/video editor  (Medical):   [name] Lack of Transportation (Non-Medical):   Physical Activity:   . Days of Exercise per Week:   . Minutes of Exercise per Session:   Stress:   . Feeling of Stress :   Social Connections:   . Frequency of Communication with Friends and Family:   . Frequency of Social Gatherings with Friends and Family:   . Attends Religious Services:   . Active Member of Clubs or Organizations:   . Attends Archivist Meetings:   [name] Marital Status:   Intimate Partner Violence:   . Fear of Current or Ex-Partner:   . Emotionally Abused:   [name] Physically Abused:   . Sexually Abused:     Family History  Problem Relation Age of Onset  . Pneumonia Mother   . Seizures Father      Current Outpatient Medications:  .  albuterol (VENTOLIN HFA) 108 (90 Base) MCG/ACT inhaler, Inhale 2 puffs into the lungs every 6 (six) hours as needed for wheezing or shortness of breath., Disp: 8 g, Rfl: 3 .  aspirin EC 81 MG EC tablet, Take 1 tablet (81 mg total) by mouth daily., Disp: 90 tablet, Rfl: 3 .  feeding supplement, ENSURE ENLIVE, (ENSURE ENLIVE) LIQD, Take 237 mLs by mouth 2 (two) times daily between meals., Disp: 60 Bottle, Rfl: 0 .  furosemide (LASIX) 40 MG tablet, Take 1 tablet (40 mg total) by mouth daily., Disp: 90 tablet, Rfl: 3 .  ipratropium-albuterol (DUONEB) 0.5-2.5 (3) MG/3ML SOLN, Take 3 mLs by nebulization every 4 (four) hours as needed., Disp: 360 mL, Rfl: 3 .  metoprolol succinate (TOPROL XL) 25 MG 24 hr tablet, Take 1.5 tablets (37.5 mg total) by mouth daily., Disp: 45 tablet, Rfl: 3 .  pantoprazole (PROTONIX) 40 MG tablet, Take 1 tablet (40 mg total) by mouth daily., Disp: 90 tablet, Rfl: 3 .  polyethylene glycol (MIRALAX / GLYCOLAX) packet, Take 17 g by mouth daily as needed for mild constipation., Disp: 14 each, Rfl: 0 .  potassium chloride SA (KLOR-CON) 20 MEQ tablet, Take 1 tablet (20 mEq total) by mouth 2 (two) times daily., Disp: 60 tablet, Rfl: 6 .  spironolactone  (ALDACTONE) 25 MG tablet, Take 1 tablet (25 mg total) by mouth daily., Disp: 90 tablet, Rfl: 3 .  acyclovir (ZOVIRAX) 400 MG tablet, TAKE 1 TABLET BY MOUTH ONCE DAILY TO  PREVENT  SHINGLES (Patient not taking: Reported on 03/06/2020), Disp: 30 tablet, Rfl: 0 .  ergocalciferol (VITAMIN D2) 1.25 MG (50000 UT) capsule, Take 1 capsule (50,000 Units total) by mouth once a week. (Patient not taking: Reported on 03/06/2020), Disp: 12 capsule, Rfl: 2 .  Fluticasone-Salmeterol (ADVAIR DISKUS) 500-50 MCG/DOSE AEPB, Inhale 1 puff into the lungs 2 (two) times daily. (Patient not taking: Reported on 02/27/2020), Disp: 1 each, Rfl: 3 .  megestrol (MEGACE ES) 625 MG/5ML suspension, Take 5 mLs (625 mg total) by mouth daily. (Patient not taking: Reported on 03/06/2020), Disp: 150 mL, Rfl: 0 No current facility-administered medications for this visit.  Facility-Administered Medications Ordered in Other Visits:  .  sodium chloride flush (NS) 0.9 % injection 10 mL, 10 mL, Intracatheter, PRN, [name], MD, 10 mL at 02/23/18 1400  Physical exam:  Vitals:   03/06/20 1146  BP: 128/74  Pulse: (!) 105  Resp: 20  Temp: 98.1 F (36.7 C)  TempSrc: Tympanic  SpO2: 100%  Weight: 118 lb 6.4 oz (53.7 kg)   Physical Exam Constitutional:      General: She is not in acute distress.    Appearance: She is well-developed.  HENT:     Head: Atraumatic.     Nose: Nose normal.     Mouth/Throat:     Pharynx: No oropharyngeal exudate.  Eyes:     General: No scleral icterus.    Conjunctiva/sclera: Conjunctivae normal.  Cardiovascular:     Rate and Rhythm: Regular rhythm. Tachycardia present.  Pulmonary:     Effort: Pulmonary effort is normal.     Breath sounds: Normal breath sounds. No decreased breath sounds, wheezing or rhonchi.  Abdominal:     General: Bowel sounds are normal. There is no distension.     Palpations: Abdomen is soft.  Musculoskeletal:        General: Normal range of motion.     Cervical  back: Normal range of motion and neck supple.     Right lower leg: No tenderness. Edema present.     Left lower leg: No tenderness. Edema present.  Skin:    General: Skin is warm and dry.  Neurological:     Mental Status: She is alert and oriented to person, place, and time.  Psychiatric:        Behavior: Behavior normal.      CMP Latest Ref Rng & Units 03/06/2020  Glucose 70 - 99 mg/dL 111(H)  BUN 8 - 23 mg/dL 13  Creatinine 0.44 - 1.00 mg/dL 0.38(L)  Sodium 135 - 145 mmol/L 141  Potassium 3.5 - 5.1 mmol/L 3.0(L)  Chloride 98 - 111 mmol/L 110  CO2 22 - 32 mmol/L 21(L)  Calcium 8.9 - 10.3 mg/dL 9.0  Total Protein 6.5 - 8.1 g/dL 6.9  Total Bilirubin 0.3 - 1.2 mg/dL 0.8  Alkaline Phos 38 - 126 U/L 181(H)  AST 15 - 41 U/L 33  ALT 0 - 44 U/L 32   CBC Latest Ref Rng & Units 03/06/2020  WBC 4.0 - 10.5 K/uL 5.6  Hemoglobin 12.0 - 15.0 g/dL 8.7(L)  Hematocrit 36 - 46 % 27.4(L)  Platelets 150 - 400 K/uL 182    No images are attached to the encounter.  No results found.  Assessment and plan- Patient is a 69 y.o. female diagnosed with multiple myeloma stage III with high risk cytogenetics status post KRD-VGPR s/p autologous stem cell transplant on 06/15/2018 who presents to symptom management clinic today for complaints of shortness of breath.  Shortness of breath-suspect symptoms are secondary to comorbidities but high risk of recurrent disease.  Has recently no-show to her appointments none not followed up.  Discussed with medical oncology who recommends PET scan ASAP.  Will get scheduled.  Continue to hold Revlimid given acute symptoms.  For her acute symptoms, will send one refill on advair and albuterol nebulizer but discussed that this will need to be filled by pulmonology in the future.  We will also send referral for care coordination assistance.  PET ASAP.  Follow-up with Dr.[name] for labs and results after.  Return to symptom management if symptoms do not improve.   Encouraged follow-up with cardiology and pulmonology.    Visit Diagnosis 1. Multiple myeloma in relapse ([place])   2. Shortness of breath     Patient expressed understanding and was in agreement with this plan. She also  understands that She can call clinic at any time with any questions, concerns, or complaints.   A total of (30) minutes of face-to-face time was spent with this patient with greater than 50% of that time in counseling and care-coordination.  Thank you for allowing me to participate in the care of this very pleasant patient.   Beckey Rutter, DNP, AGNP-C [facility] [place]

## 2020-03-07 ENCOUNTER — Other Ambulatory Visit: Payer: Self-pay | Admitting: Internal Medicine

## 2020-03-07 DIAGNOSIS — C9002 Multiple myeloma in relapse: Secondary | ICD-10-CM

## 2020-03-07 LAB — KAPPA/LAMBDA LIGHT CHAINS
Kappa free light chain: 18.8 mg/L (ref 3.3–19.4)
Kappa, lambda light chain ratio: 0.63 (ref 0.26–1.65)
Lambda free light chains: 29.8 mg/L — ABNORMAL HIGH (ref 5.7–26.3)

## 2020-03-10 LAB — MULTIPLE MYELOMA PANEL, SERUM
Albumin SerPl Elph-Mcnc: 3.3 g/dL (ref 2.9–4.4)
Albumin/Glob SerPl: 1.2 (ref 0.7–1.7)
Alpha 1: 0.2 g/dL (ref 0.0–0.4)
Alpha2 Glob SerPl Elph-Mcnc: 0.7 g/dL (ref 0.4–1.0)
B-Globulin SerPl Elph-Mcnc: 0.9 g/dL (ref 0.7–1.3)
Gamma Glob SerPl Elph-Mcnc: 1.2 g/dL (ref 0.4–1.8)
Globulin, Total: 3 g/dL (ref 2.2–3.9)
IgA: 244 mg/dL (ref 87–352)
IgG (Immunoglobin G), Serum: 1101 mg/dL (ref 586–1602)
IgM (Immunoglobulin M), Srm: 28 mg/dL (ref 26–217)
Total Protein ELP: 6.3 g/dL (ref 6.0–8.5)

## 2020-03-12 ENCOUNTER — Other Ambulatory Visit
Admission: RE | Admit: 2020-03-12 | Discharge: 2020-03-12 | Disposition: A | Discharge status: Home / Self Care | Payer: Medicare Other | Source: Ambulatory Visit | Attending: Pulmonary Disease | Admitting: Pulmonary Disease

## 2020-03-12 ENCOUNTER — Telehealth: Payer: Self-pay | Admitting: *Deleted

## 2020-03-12 DIAGNOSIS — J449 Chronic obstructive pulmonary disease, unspecified: Secondary | ICD-10-CM | POA: Diagnosis present

## 2020-03-12 LAB — FIBRIN DERIVATIVES D-DIMER (ARMC ONLY): Fibrin derivatives D-dimer (ARMC): 1144.77 ng/mL (FEU) — ABNORMAL HIGH (ref 0.00–499.00)

## 2020-03-12 NOTE — Telephone Encounter (Signed)
Spoke with [name] and follow-up apt for f/u apts with Dr. Jacinto Reap. apts made for Wed 8/25 at 945 am

## 2020-03-12 NOTE — Telephone Encounter (Signed)
-----   Message from Liana Crocker sent at 03/12/2020  3:48 PM EDT ----- Patient left voicemail wanting to talk with Dr. Sharmaine Base RN, Nira Conn, to schedule with Dr. Jacinto Reap. Was last seen 6-9. Does she need lab/md? Want to check before scheduling. Thanks!

## 2020-03-13 ENCOUNTER — Other Ambulatory Visit: Payer: Self-pay | Admitting: Pulmonary Disease

## 2020-03-13 ENCOUNTER — Other Ambulatory Visit: Payer: Medicare Other

## 2020-03-13 ENCOUNTER — Ambulatory Visit
Admission: RE | Admit: 2020-03-13 | Discharge: 2020-03-13 | Disposition: A | Discharge status: Home / Self Care | Payer: Medicare Other | Source: Ambulatory Visit | Attending: Pulmonary Disease | Admitting: Pulmonary Disease

## 2020-03-13 ENCOUNTER — Ambulatory Visit: Payer: Medicare Other

## 2020-03-13 ENCOUNTER — Other Ambulatory Visit: Payer: Self-pay

## 2020-03-13 DIAGNOSIS — R7989 Other specified abnormal findings of blood chemistry: Secondary | ICD-10-CM | POA: Diagnosis not present

## 2020-03-13 MED ORDER — IOHEXOL 350 MG/ML SOLN
75.0000 mL | Freq: Once | INTRAVENOUS | Status: AC | PRN
  Administered 2020-03-13: 75 mL via INTRAVENOUS

## 2020-03-17 ENCOUNTER — Other Ambulatory Visit: Payer: Self-pay

## 2020-03-17 ENCOUNTER — Ambulatory Visit
Admission: RE | Admit: 2020-03-17 | Discharge: 2020-03-17 | Disposition: A | Discharge status: Home / Self Care | Payer: Medicare Other | Source: Ambulatory Visit | Attending: Internal Medicine | Admitting: Internal Medicine

## 2020-03-17 DIAGNOSIS — C9000 Multiple myeloma not having achieved remission: Secondary | ICD-10-CM | POA: Diagnosis not present

## 2020-03-17 DIAGNOSIS — E049 Nontoxic goiter, unspecified: Secondary | ICD-10-CM | POA: Diagnosis not present

## 2020-03-17 DIAGNOSIS — I700 Atherosclerosis of aorta: Secondary | ICD-10-CM | POA: Insufficient documentation

## 2020-03-17 DIAGNOSIS — I2510 Atherosclerotic heart disease of native coronary artery without angina pectoris: Secondary | ICD-10-CM | POA: Insufficient documentation

## 2020-03-17 LAB — GLUCOSE, CAPILLARY: Glucose-Capillary: 90 mg/dL (ref 70–99)

## 2020-03-17 MED ORDER — FLUDEOXYGLUCOSE F - 18 (FDG) INJECTION
6.1000 | Freq: Once | INTRAVENOUS | Status: AC | PRN
  Administered 2020-03-17: 6.46 via INTRAVENOUS

## 2020-03-18 ENCOUNTER — Other Ambulatory Visit: Payer: Self-pay

## 2020-03-18 DIAGNOSIS — C9002 Multiple myeloma in relapse: Secondary | ICD-10-CM

## 2020-03-19 ENCOUNTER — Encounter: Payer: Self-pay | Admitting: Internal Medicine

## 2020-03-19 ENCOUNTER — Other Ambulatory Visit: Payer: Self-pay

## 2020-03-19 ENCOUNTER — Telehealth: Payer: Self-pay | Admitting: Internal Medicine

## 2020-03-19 ENCOUNTER — Inpatient Hospital Stay (HOSPITAL_BASED_OUTPATIENT_CLINIC_OR_DEPARTMENT_OTHER): Payer: Medicare Other | Admitting: Internal Medicine

## 2020-03-19 VITALS — BP 129/64 | HR 84 | Temp 98.1°F | Resp 18 | Ht 62.0 in | Wt 128.0 lb

## 2020-03-19 DIAGNOSIS — C9000 Multiple myeloma not having achieved remission: Secondary | ICD-10-CM

## 2020-03-19 DIAGNOSIS — R1310 Dysphagia, unspecified: Secondary | ICD-10-CM | POA: Diagnosis not present

## 2020-03-19 NOTE — Assessment & Plan Note (Addendum)
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 FEB 2021- [facility]- NEGATIVE [[facility],Dr.G on 11/22/2019]. PET scan-August 2021-no evidence of any recurrent myeloma.  Myeloma panel; kappa lambda light chain ratio March 06, 2020-negative/normal.   #Continue to hold maintenance Revlimid given acute issues.  #Difficulty swallowing dysphagia-multinodular goiter noted recommend ENT evaluation.  S/p GI eval.  # Anemia-hemoglobin 8-10 -overall stable; etiology is unclear not likely secondary to multiple myeloma.  Monitor closely for now.  # chronic respiratory failure- sec to COPD/diastolic CHF on diuretics nebulizer home O2; follow-up with pulmonary, Dr.A.   # Bone lesions/pain stable last zometa on 11/27/2017.  Holding Zometa secondary to poor dentition.  Stable on PET scan.  # I again reviewed the vaccine effectiveness and potential side effects in detail.  Also discussed long-term effectiveness and safety profile are unclear at this time.  I discussed December, 2020 [employer] position statement-that all patients are recommended COVID-19 vaccinations [when available]-as long as they do not have allergy to components of the vaccine.  However, I think the benefits of the vaccination outweigh the potential risks. Re: OPFYT-24 vaccination. Pt will go to pharmacy to get her shots.   # DISPOSITION:  # [facility] E[facility]- re: Multinodular goitre/dysphagia # Follow up 1 months MD; labs-cbc/ cbc/cmp/mag;MM panel;K/L light chains--Dr.B

## 2020-03-19 NOTE — Telephone Encounter (Signed)
Spoke to patient's daughter regarding multiple myeloma being under good control-negative PET scan/myeloma work-up.  Discussed regarding referral to ENT for evaluation of multinodular goiter/dysphagia.  Recommend continued follow-up with pulmonary/cardiology regarding respiratory failure.

## 2020-03-19 NOTE — Progress Notes (Signed)
[place] OFFICE PROGRESS NOTE  Patient Care Team: [facility], [place] as PCP - General (General Practice) End, Harrell Gave, MD as PCP - Cardiology (Cardiology) [name], [name] [name], MD as Consulting Physician (Hematology and Oncology) Ottie Glazier, MD as Consulting Physician (Pulmonary Disease)  Cancer Staging No matching staging information was found for the patient.   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 No morphologic support for residual myeloma disease. Negative for minimal residual disease by MM-MRD flow cytometry; HOLD REVLIMID [leg swelling]; AUG 2021-PET scan negative for myeloma; continue to hold Revlimid  # MARCH 2021-diastolic congestive heart failure [Dr.End]  --------------------------  # 12/12- RIGHT JUGULAR DVT-x 367mn xarelto; finished April 2020; September 2020-EGD/dysphagia; Dr. AnVicente Males# Acute renal failure [Dr.Singh; Proteinuria 1.5gm/day ];  acyclovir/Asprin ------------------------------------------------------------------------------------------------------------   DIAGNOSIS: _0  MULTIPLE MYELOMA  STAGE: III/HIGH RISK ;GOALS: CONTROL  CURRENT/MOST RECENT THERAPY-maintenance Revlimid    [place]A myel[place] 11[place]07/2017 -  Chemotherapy   The patient had palonosetron (ALOXI) injection 0.25 mg, 0.25 mg, Intravenous,  Once, 6 of 7 cycles Administration: 0.25 mg (11/21/2017), 0.25 mg (11/29/2017), 0.25 mg (12/05/2017), 0.25 mg (12/21/2017), 0.25 mg (12/28/2017), 0.25 mg (01/04/2018), 0.25 mg (01/18/2018), 0.25 mg (01/25/2018), 0.25 mg (02/01/2018), 0.25 mg (02/15/2018), 0.25 mg (02/22/2018), 0.25 mg (03/01/2018), 0.25 mg (03/15/2018), 0.25 mg (03/22/2018), 0.25 mg (03/30/2018), 0.25 mg (04/13/2018), 0.25 mg (04/20/2018), 0.25 mg (04/27/2018) cyclophosphamide (CYTOXAN) 500 mg in sodium chloride 0.9 % 250 mL chemo infusion, 540 mg, Intravenous,  Once, 6 of 7 cycles Administration: 500 mg (11/21/2017), 500 mg (11/29/2017), 500 mg (12/05/2017), 500 mg (12/28/2017), 500 mg (01/04/2018), 500 mg (01/18/2018), 500 mg (01/25/2018), 500 mg (02/01/2018), 500 mg (02/15/2018), 500 mg (02/22/2018), 500 mg (03/01/2018), 500 mg (03/15/2018), 500 mg (03/30/2018), 500 mg (04/13/2018), 500 mg (04/20/2018), 500 mg (04/27/2018) carfilzomib (KYPROLIS) 36 mg in dextrose 5 % 50 mL chemo infusion, 20 mg/m2 = 36 mg, Intravenous, Once, 6 of 7 cycles Administration: 36 mg (11/21/2017), 36 mg (11/22/2017), 60 mg (11/29/2017), 60 mg (11/30/2017), 60 mg (12/05/2017), 60 mg (12/06/2017), 60 mg (12/21/2017), 60 mg (12/22/2017), 60 mg (12/28/2017), 60 mg (12/29/2017), 60 mg (01/04/2018), 60 mg (01/05/2018), 60 mg (01/18/2018), 60 mg (01/19/2018), 60 mg (01/25/2018), 60 mg (01/27/2018), 60 mg (02/01/2018), 60 mg (02/02/2018), 60 mg (02/15/2018), 60 mg (02/16/2018), 60 mg (02/22/2018), 60 mg (03/01/2018), 60 mg (03/02/2018), 60 mg (03/15/2018), 60 mg (03/16/2018), 60 mg (03/23/2018), 60 mg (03/30/2018), 60 mg (03/31/2018), 60 mg (04/13/2018), 60 mg (04/14/2018),  60 mg (04/20/2018), 60 mg (04/21/2018), 60 mg (04/27/2018)  for chemotherapy treatment.     INTERVAL HISTORY:  Brandi [name].o.  female pleasant patient above history of relapsed multiple myeloma--most recently not on maintenance Revlimid [on since  Feb 2021] is here for follow-up/review results of PET scan.  Patient has been evaluated by pulmonary, Dr.Aleskerov-currently on home O2.  States his breathing is better.  Continues to complain of difficulty swallowing intermittently.  No pain while swallowing.  Chronic shortness of breath chronic cough..  Review of Systems  Constitutional: Positive for malaise/fatigue. Negative for chills, diaphoresis and fever.  HENT: Negative for nosebleeds and sore throat.   Eyes: Negative for double vision.  Respiratory: Positive for cough and shortness of breath. Negative for hemoptysis and sputum production.   Cardiovascular: Positive for leg swelling. Negative for chest pain, palpitations and orthopnea.  Gastrointestinal: Negative for abdominal pain, blood in stool, constipation, heartburn and melena.  Genitourinary: Negative for dysuria, frequency and urgency.  Musculoskeletal: Positive for back pain and myalgias.  Skin: Negative.  Negative for itching and rash.  Neurological: Negative for dizziness, tingling, focal weakness, weakness and headaches.  Endo/Heme/Allergies: Does not bruise/bleed easily.  Psychiatric/Behavioral: Negative for depression. The patient has insomnia. The patient is not nervous/anxious.     PAST MEDICAL HISTORY :  Past Medical History:  Diagnosis Date  . At risk for cardiac dysfunction   . Hypertension   . Hypokalemia   . IgA myeloma ([place])   . Multiple myeloma ([place])   . Red blood cell antibody positive, compatible PRBC difficult to obtain   . S/P autologous bone marrow transplantation ([place])     PAST SURGICAL HISTORY :   Past Surgical History:  Procedure Laterality Date  . ESOPHAGOGASTRODUODENOSCOPY (EGD) WITH  PROPOFOL N/A 03/30/2019   Procedure: ESOPHAGOGASTRODUODENOSCOPY (EGD) WITH PROPOFOL;  Surgeon: Jonathon Bellows, MD;  Location: [facility] ENDOSCOPY;  Service: Gastroenterology;  Laterality: N/A;  . IR FLUORO GUIDE PORT INSERTION RIGHT  12/16/2017    FAMILY HISTORY :   Family History  Problem Relation Age of Onset  . Pneumonia Mother   . Seizures Father     SOCIAL HISTORY:   Social History   Tobacco Use  . Smoking status: Current Some Day Smoker    Packs/day: 0.50    Types: Cigarettes  . Smokeless tobacco: Never Used  . Tobacco comment: 2 cigarettes QOD  Vaping Use  . Vaping Use: Never used  Substance Use Topics  . Alcohol use: No  . Drug use: No    ALLERGIES:  has No Known Allergies.  MEDICATIONS:  Current Outpatient Medications  Medication Sig Dispense Refill  . acyclovir (ZOVIRAX) 400 MG tablet TAKE 1 TABLET BY MOUTH ONCE DAILY TO  PREVENT  SHINGLES 30 tablet 0  . albuterol (VENTOLIN HFA) 108 (90 Base) MCG/ACT inhaler Inhale 2 puffs into the lungs every 6 (six) hours as needed for wheezing or shortness of breath. 8 g 3  . aspirin EC 81 MG EC tablet Take 1 tablet (81 mg total) by mouth daily. 90 tablet 3  . ergocalciferol (VITAMIN D2) 1.25 MG (50000 UT) capsule Take 1 capsule (50,000 Units total) by mouth once a week. 12 capsule 2  . feeding supplement, ENSURE ENLIVE, (ENSURE ENLIVE) LIQD Take 237 mLs by mouth 2 (two) times daily between meals. 60 Bottle 0  . Fluticasone-Salmeterol (ADVAIR DISKUS) 500-50 MCG/DOSE AEPB Inhale 1 puff into the lungs 2 (two) times daily. 1 each 3  . furosemide (LASIX) 40 MG tablet Take 1 tablet (40 mg total) by mouth daily. 90 tablet 3  . ipratropium-albuterol (DUONEB) 0.5-2.5 (3) MG/3ML SOLN Take 3 mLs by nebulization every 4 (four) hours as needed. 360 mL 3  . metoprolol succinate (TOPROL XL) 25 MG  24 hr tablet Take 1.5 tablets (37.5 mg total) by mouth daily. 45 tablet 3  . pantoprazole (PROTONIX) 40 MG tablet Take 1 tablet by mouth once daily 90 tablet 0   . polyethylene glycol (MIRALAX / GLYCOLAX) packet Take 17 g by mouth daily as needed for mild constipation. 14 each 0  . potassium chloride SA (KLOR-CON) 20 MEQ tablet Take 1 tablet (20 mEq total) by mouth 2 (two) times daily. 60 tablet 6  . spironolactone (ALDACTONE) 25 MG tablet Take 1 tablet (25 mg total) by mouth daily. 90 tablet 3  . megestrol (MEGACE ES) 625 MG/5ML suspension Take 5 mLs (625 mg total) by mouth daily. (Patient not taking: Reported on 03/06/2020) 150 mL 0   No current facility-administered medications for this visit.   Facility-Administered Medications Ordered in Other Visits  Medication Dose Route Frequency Provider Last Rate Last Admin  . sodium chloride flush (NS) 0.9 % injection 10 mL  10 mL Intracatheter PRN [name], MD   10 mL at 02/23/18 1400    PHYSICAL EXAMINATION: ECOG PERFORMANCE STATUS: 1 - Symptomatic but completely ambulatory  BP 129/64 (BP Location: Left Arm, Patient Position: Sitting, Cuff Size: Normal)   Pulse 84   Temp 98.1 F (36.7 C) (Tympanic)   Resp 18   Ht _0  (1.575 m)   Wt 128 lb (58.1 kg)   SpO2 100%   BMI 23.41 kg/m   Filed Weights   03/19/20 0926  Weight: 128 lb (58.1 kg)    Physical Exam Constitutional:      Comments: Alone. She is walking herself.  HENT:     Head: Normocephalic and atraumatic.  Eyes:     Pupils: Pupils are equal, round, and reactive to light.  Cardiovascular:     Rate and Rhythm: Normal rate and regular rhythm.  Pulmonary:     Effort: Pulmonary effort is normal. No respiratory distress.     Breath sounds: Normal breath sounds. No wheezing.  Abdominal:     General: Bowel sounds are normal. There is no distension.     Palpations: Abdomen is soft. There is no mass.     Tenderness: There is no abdominal tenderness. There is no guarding or rebound.  Musculoskeletal:        General: No tenderness. Normal range of motion.     Cervical back: Normal range of motion and neck supple.  Skin:     General: Skin is warm.  Neurological:     Mental Status: She is alert and oriented to person, place, and time.  Psychiatric:        Mood and Affect: Affect normal.    LABORATORY DATA:  I have reviewed the data as listed    Component Value Date/Time   NA 141 03/06/2020 1118   NA 141 01/25/2020 1530   K 3.0 (L) 03/06/2020 1118   CL 110 03/06/2020 1118   CO2 21 (L) 03/06/2020 1118   GLUCOSE 111 (H) 03/06/2020 1118   BUN 13 03/06/2020 1118   BUN 19 01/25/2020 1530   CREATININE 0.38 (L) 03/06/2020 1118   CALCIUM 9.0 03/06/2020 1118   PROT 6.9 03/06/2020 1118   ALBUMIN 3.6 03/06/2020 1118   AST 33 03/06/2020 1118   ALT 32 03/06/2020 1118   ALKPHOS 181 (H) 03/06/2020 1118   BILITOT 0.8 03/06/2020 1118   GFRNONAA >60 03/06/2020 1118   GFRAA >60 03/06/2020 1118    No results found for: SPEP, UPEP  Lab Results  Component Value  Date   WBC 5.6 03/06/2020   NEUTROABS 2.4 03/06/2020   HGB 8.7 (L) 03/06/2020   HCT 27.4 (L) 03/06/2020   MCV 82.0 03/06/2020   PLT 182 03/06/2020      Chemistry      Component Value Date/Time   NA 141 03/06/2020 1118   NA 141 01/25/2020 1530   K 3.0 (L) 03/06/2020 1118   CL 110 03/06/2020 1118   CO2 21 (L) 03/06/2020 1118   BUN 13 03/06/2020 1118   BUN 19 01/25/2020 1530   CREATININE 0.38 (L) 03/06/2020 1118      Component Value Date/Time   CALCIUM 9.0 03/06/2020 1118   ALKPHOS 181 (H) 03/06/2020 1118   AST 33 03/06/2020 1118   ALT 32 03/06/2020 1118   BILITOT 0.8 03/06/2020 1118       RADIOGRAPHIC STUDIES: I have personally reviewed the radiological images as listed and agreed with the findings in the report. No results found.   ASSESSMENT & PLAN:  IgA myeloma ([place]) #Multiple myeloma stage III high-risk cytogenetics-status post KRD-VGPR ; status post autologousstem cell transplant on 06/15/18. FEB 2021- [facility]- NEGATIVE [[facility],Dr.G on 11/22/2019]. PET scan-August 2021-no evidence of any recurrent myeloma.  Myeloma panel; kappa  lambda light chain ratio March 06, 2020-negative/normal.   #Continue to hold maintenance Revlimid given acute issues.  #Difficulty swallowing dysphagia-multinodular goiter noted recommend ENT evaluation.  S/p GI eval.  # Anemia-hemoglobin 8-10 -overall stable; etiology is unclear not likely secondary to multiple myeloma.  Monitor closely for now.  # chronic respiratory failure- sec to COPD/diastolic CHF on diuretics nebulizer home O2; follow-up with pulmonary, Dr.A.   # Bone lesions/pain stable last zometa on 11/27/2017.  Holding Zometa secondary to poor dentition.  Stable on PET scan.  # I again reviewed the vaccine effectiveness and potential side effects in detail.  Also discussed long-term effectiveness and safety profile are unclear at this time.  I discussed December, 2020 [employer] position statement-that all patients are recommended COVID-19 vaccinations [when available]-as long as they do not have allergy to components of the vaccine.  However, I think the benefits of the vaccination outweigh the potential risks. Re: SWNIO-27 vaccination. Pt will go to pharmacy to get her shots.   # DISPOSITION:  # [facility]- re: Multinodular goitre/dysphagia # Follow up 1 months MD; labs-cbc/ cbc/cmp/mag;MM panel;K/L light chains--Dr.B   Orders Placed This Encounter  Procedures  . CBC with Differential    Standing Status:   Future    Standing Expiration Date:   03/19/2021  . Comprehensive metabolic panel    Standing Status:   Future    Standing Expiration Date:   03/19/2021  . Multiple Myeloma Panel (SPEP&IFE w/QIG)    Standing Status:   Future    Standing Expiration Date:   03/19/2021  . Kappa/lambda light chains    Standing Status:   Future    Standing Expiration Date:   03/19/2021  . Magnesium    Standing Status:   Future    Standing Expiration Date:   03/19/2021  . Ambulatory referral to ENT    Referral Priority:   Routine    Referral Type:   Consultation    Referral Reason:   Specialty  Services Required    Requested Specialty:   Otolaryngology    Number of Visits Requested:   1   All questions were answered. The patient knows to call the clinic with any problems, questions or concerns.      [name], MD 03/19/2020  1:00 PM

## 2020-03-20 NOTE — Telephone Encounter (Signed)
Referral faxed to [facility] 

## 2020-04-03 ENCOUNTER — Other Ambulatory Visit: Payer: Self-pay | Admitting: Otolaryngology

## 2020-04-03 ENCOUNTER — Other Ambulatory Visit: Payer: Self-pay | Admitting: *Deleted

## 2020-04-03 DIAGNOSIS — E049 Nontoxic goiter, unspecified: Secondary | ICD-10-CM

## 2020-04-07 ENCOUNTER — Inpatient Hospital Stay: Payer: Medicare Other | Attending: Internal Medicine

## 2020-04-07 ENCOUNTER — Telehealth: Payer: Self-pay

## 2020-04-07 ENCOUNTER — Other Ambulatory Visit: Payer: Self-pay

## 2020-04-07 DIAGNOSIS — C9000 Multiple myeloma not having achieved remission: Secondary | ICD-10-CM | POA: Insufficient documentation

## 2020-04-07 DIAGNOSIS — J9610 Chronic respiratory failure, unspecified whether with hypoxia or hypercapnia: Secondary | ICD-10-CM | POA: Insufficient documentation

## 2020-04-07 DIAGNOSIS — M8580 Other specified disorders of bone density and structure, unspecified site: Secondary | ICD-10-CM | POA: Insufficient documentation

## 2020-04-07 DIAGNOSIS — R1310 Dysphagia, unspecified: Secondary | ICD-10-CM | POA: Insufficient documentation

## 2020-04-07 DIAGNOSIS — I509 Heart failure, unspecified: Secondary | ICD-10-CM | POA: Insufficient documentation

## 2020-04-07 DIAGNOSIS — D649 Anemia, unspecified: Secondary | ICD-10-CM | POA: Insufficient documentation

## 2020-04-07 DIAGNOSIS — Z9981 Dependence on supplemental oxygen: Secondary | ICD-10-CM | POA: Insufficient documentation

## 2020-04-07 NOTE — Telephone Encounter (Signed)
Nutrition  Called patient for nutrition phone follow-up.  No answer. Left message with call back number.  Shawnya Mayor B. Zenia Resides, [place], [place] Registered Dietitian [phone] (mobile)

## 2020-04-10 ENCOUNTER — Ambulatory Visit: Admission: RE | Admit: 2020-04-10 | Payer: Medicare Other | Source: Ambulatory Visit

## 2020-04-10 ENCOUNTER — Other Ambulatory Visit: Payer: Self-pay

## 2020-04-10 ENCOUNTER — Emergency Department: Payer: Medicare Other

## 2020-04-10 ENCOUNTER — Emergency Department
Admission: EM | Admit: 2020-04-10 | Discharge: 2020-04-10 | Disposition: A | Discharge status: Home / Self Care | Payer: Medicare Other | Attending: Emergency Medicine | Admitting: Emergency Medicine

## 2020-04-10 DIAGNOSIS — I509 Heart failure, unspecified: Secondary | ICD-10-CM | POA: Insufficient documentation

## 2020-04-10 DIAGNOSIS — Z87891 Personal history of nicotine dependence: Secondary | ICD-10-CM | POA: Insufficient documentation

## 2020-04-10 DIAGNOSIS — Z20822 Contact with and (suspected) exposure to covid-19: Secondary | ICD-10-CM | POA: Insufficient documentation

## 2020-04-10 DIAGNOSIS — I110 Hypertensive heart disease with heart failure: Secondary | ICD-10-CM | POA: Insufficient documentation

## 2020-04-10 DIAGNOSIS — Z79899 Other long term (current) drug therapy: Secondary | ICD-10-CM | POA: Insufficient documentation

## 2020-04-10 DIAGNOSIS — R0602 Shortness of breath: Secondary | ICD-10-CM

## 2020-04-10 DIAGNOSIS — J069 Acute upper respiratory infection, unspecified: Secondary | ICD-10-CM

## 2020-04-10 DIAGNOSIS — Z7982 Long term (current) use of aspirin: Secondary | ICD-10-CM | POA: Insufficient documentation

## 2020-04-10 LAB — BASIC METABOLIC PANEL
Anion gap: 7 (ref 5–15)
BUN: 8 mg/dL (ref 8–23)
CO2: 25 mmol/L (ref 22–32)
Calcium: 8.5 mg/dL — ABNORMAL LOW (ref 8.9–10.3)
Chloride: 105 mmol/L (ref 98–111)
Creatinine, Ser: 0.37 mg/dL — ABNORMAL LOW (ref 0.44–1.00)
GFR calc Af Amer: >60 mL/min (ref >60)
GFR calc non Af Amer: >60 mL/min (ref >60)
Glucose, Bld: 95 mg/dL (ref 70–99)
Potassium: 3.5 mmol/L (ref 3.5–5.1)
Sodium: 137 mmol/L (ref 135–145)

## 2020-04-10 LAB — CBC WITH DIFFERENTIAL/PLATELET
Abs Immature Granulocytes: 0.05 10*3/uL (ref 0.00–0.07)
Basophils Absolute: 0 10*3/uL (ref 0.0–0.1)
Basophils Relative: 1 %
Eosinophils Absolute: 0.2 10*3/uL (ref 0.0–0.5)
Eosinophils Relative: 4 %
HCT: 25.6 % — ABNORMAL LOW (ref 36.0–46.0)
Hemoglobin: 7.9 g/dL — ABNORMAL LOW (ref 12.0–15.0)
Immature Granulocytes: 1 %
Lymphocytes Relative: 44 %
Lymphs Abs: 1.8 10*3/uL (ref 0.7–4.0)
MCH: 26.1 pg (ref 26.0–34.0)
MCHC: 30.9 g/dL (ref 30.0–36.0)
MCV: 84.5 fL (ref 80.0–100.0)
Monocytes Absolute: 0.4 10*3/uL (ref 0.1–1.0)
Monocytes Relative: 10 %
Neutro Abs: 1.6 10*3/uL — ABNORMAL LOW (ref 1.7–7.7)
Neutrophils Relative %: 40 %
Platelets: 138 10*3/uL — ABNORMAL LOW (ref 150–400)
RBC: 3.03 MIL/uL — ABNORMAL LOW (ref 3.87–5.11)
RDW: 13.8 % (ref 11.5–15.5)
Smear Review: NORMAL
WBC: 3.7 10*3/uL — ABNORMAL LOW (ref 4.0–10.5)
nRBC: 0.5 % — ABNORMAL HIGH (ref 0.0–0.2)

## 2020-04-10 LAB — SARS CORONAVIRUS 2 BY RT PCR (HOSPITAL ORDER, PERFORMED IN ~~LOC~~ HOSPITAL LAB): SARS Coronavirus 2: NEGATIVE

## 2020-04-10 MED ORDER — ALBUTEROL SULFATE (2.5 MG/3ML) 0.083% IN NEBU
3.0000 mL | INHALATION_SOLUTION | Freq: Once | RESPIRATORY_TRACT | Status: AC
  Administered 2020-04-10: 3 mL via RESPIRATORY_TRACT
  Filled 2020-04-10: qty 3

## 2020-04-10 MED ORDER — ACETAMINOPHEN 500 MG PO TABS
1000.0000 mg | ORAL_TABLET | Freq: Once | ORAL | Status: AC
  Administered 2020-04-10: 1000 mg via ORAL
  Filled 2020-04-10: qty 2

## 2020-04-10 MED ORDER — PREDNISONE 20 MG PO TABS
20.0000 mg | ORAL_TABLET | Freq: Once | ORAL | Status: AC
  Administered 2020-04-10: 20 mg via ORAL
  Filled 2020-04-10: qty 1

## 2020-04-10 MED ORDER — PREDNISONE 20 MG PO TABS: 20.0000 mg | ORAL_TABLET | Freq: Every day | ORAL | 0 refills | Status: AC

## 2020-04-10 NOTE — ED Notes (Signed)
See triage note   Presents with some congestion and SOB  States she had her 1st COVID shot this week  No fever  Pt is currently on home o2

## 2020-04-10 NOTE — Discharge Instructions (Addendum)
Use Tylenol for pain and fevers.  Up to 1000 mg per dose, up to 4 times per day.  Do not take more than 4000 mg of Tylenol/acetaminophen within 24 hours..   You have evidence of a viral infection like a cold causing your lungs to be irritated.  You are being discharged a prescription for prednisone steroids to take once daily for the next 4 days to help open up your lungs.  We gave you today's dose here in the ED, please pick up this medication and start taking it at home tomorrow.  Continue your home inhalers of albuterol and DuoNeb, suspect they will help.  I would also recommend picking up over-the-counter decongestion such as Sudafed.  You tested negative for COVID-19 in the ED today.  Please follow-up with your PCP and lung doctor.  If you develop any worsening symptoms despite these medications, please return to the ED.

## 2020-04-10 NOTE — ED Triage Notes (Addendum)
Pt c/o cough with increased SOb, congestion that started last night. Pt is on home O2 at 2L [place]. Pt is in NAD. Pt states she had her first covid shot on tuesday

## 2020-04-10 NOTE — ED Provider Notes (Signed)
[facility] Emergency Department Provider Note ____________________________________________   First MD Initiated Contact with Patient 04/10/20 1610     (approximate)  I have reviewed the triage vital signs and the nursing notes.  HISTORY  Chief Complaint URI   HPI Brandi [name] is a 69 y.o. femalewho presents to the ED for evaluation of upper respiratory congestion.   Chart review indicates history of multiple myeloma.  History of CHF, COPD?  On home 2 L O2 and continued cigarette smoking.  Patient has albuterol inhaler and DuoNeb's at home.  Patient reports developing upper respiratory congestion with clear rhinorrhea and associated nonproductive cough and sensation of chest congestion since last night.  She reports difficulty sleeping due to her congestion.  She reports the congestion makes her feel short of breath, though "when I can breathe through my nose I feel like I am breathing fine."  She reports not using her home inhalers to help with her symptoms, she has not tried any OTC medications.  She has continued her regular daily medications. Patient denies any pain anywhere, particularly chest pain, back pain, abdominal pain or headache.  No syncopal episodes.  No fevers.  She does report getting her first shot of mRNA Covid vaccine 2 days ago.  No known Covid contacts.    Past Medical History:  Diagnosis Date  . At risk for cardiac dysfunction   . Hypertension   . Hypokalemia   . IgA myeloma ([place])   . Multiple myeloma ([place])   . Red blood cell antibody positive, compatible PRBC difficult to obtain   . S/P autologous bone marrow transplantation [facility])     Patient Active Problem List   Diagnosis Date Noted  . Chronic heart failure with preserved ejection fraction (HFpEF) ([place]) 12/14/2019  . S/P autologous bone marrow transplantation ([place]) 06/29/2018  . Essential hypertension 05/29/2018  . At risk for cardiac dysfunction 05/26/2018  .  Hypokalemia 05/01/2018  . Encounter for monitoring cardiotoxic drug therapy 11/08/2017  . Red blood cell antibody positive, compatible PRBC difficult to obtain 10/21/2017  . Cough in adult patient 07/29/2017  . Hip pain, acute, right 07/29/2017  . IgA myeloma ([place]) 04/08/2017  . ARF (acute renal failure) ([place]) 04/05/2017    Past Surgical History:  Procedure Laterality Date  . ESOPHAGOGASTRODUODENOSCOPY (EGD) WITH PROPOFOL N/A 03/30/2019   Procedure: ESOPHAGOGASTRODUODENOSCOPY (EGD) WITH PROPOFOL;  Surgeon: Jonathon Bellows, MD;  Location: [facility] ENDOSCOPY;  Service: Gastroenterology;  Laterality: N/A;  . IR FLUORO GUIDE PORT INSERTION RIGHT  12/16/2017    Prior to Admission medications   Medication Sig Start Date End Date Taking? Authorizing Provider  acyclovir (ZOVIRAX) 400 MG tablet TAKE 1 TABLET BY MOUTH ONCE DAILY TO  PREVENT  SHINGLES 03/07/20   [name], MD  albuterol (VENTOLIN HFA) 108 (90 Base) MCG/ACT inhaler Inhale 2 puffs into the lungs every 6 (six) hours as needed for wheezing or shortness of breath. 03/05/20   [name], MD  aspirin EC 81 MG EC tablet Take 1 tablet (81 mg total) by mouth daily. 01/25/20   Loel Dubonnet, NP  ergocalciferol (VITAMIN D2) 1.25 MG (50000 UT) capsule Take 1 capsule (50,000 Units total) by mouth once a week. 11/29/19   [name], MD  feeding supplement, ENSURE ENLIVE, (ENSURE ENLIVE) LIQD Take 237 mLs by mouth 2 (two) times daily between meals. 04/12/17   Gouru, Illene Silver, MD  Fluticasone-Salmeterol (ADVAIR DISKUS) 500-50 MCG/DOSE AEPB Inhale 1 puff into the lungs 2 (two)  times daily. 10/02/19   [name], MD  furosemide (LASIX) 40 MG tablet Take 1 tablet (40 mg total) by mouth daily. 11/12/19   Loel Dubonnet, NP  ipratropium-albuterol (DUONEB) 0.5-2.5 (3) MG/3ML SOLN Take 3 mLs by nebulization every 4 (four) hours as needed. 01/02/20   [name], MD  metoprolol succinate (TOPROL XL) 25 MG 24 hr tablet Take  1.5 tablets (37.5 mg total) by mouth daily. 02/13/20   Loel Dubonnet, NP  pantoprazole (PROTONIX) 40 MG tablet Take 1 tablet by mouth once daily 03/07/20   [name], MD  polyethylene glycol (MIRALAX / GLYCOLAX) packet Take 17 g by mouth daily as needed for mild constipation. 04/11/17   Gouru, Illene Silver, MD  potassium chloride SA (KLOR-CON) 20 MEQ tablet Take 1 tablet (20 mEq total) by mouth 2 (two) times daily. 09/18/19   [name], MD  predniSONE (DELTASONE) 20 MG tablet Take 1 tablet (20 mg total) by mouth daily with breakfast for 4 days. 04/10/20 04/14/20  Vladimir Crofts, MD  spironolactone (ALDACTONE) 25 MG tablet Take 1 tablet (25 mg total) by mouth daily. 01/25/20 04/24/20  Loel Dubonnet, NP    Allergies Patient has no known allergies.  Family History  Problem Relation Age of Onset  . Pneumonia Mother   . Seizures Father     Social History Social History   Tobacco Use  . Smoking status: Former Smoker    Packs/day: 0.50    Types: Cigarettes  . Smokeless tobacco: Never Used  . Tobacco comment: 2 cigarettes QOD  Vaping Use  . Vaping Use: Never used  Substance Use Topics  . Alcohol use: No  . Drug use: No    Review of Systems  Constitutional: No fever/chills Eyes: No visual changes. ENT: No sore throat.  Positive for upper respiratory congestion and clear rhinorrhea. Cardiovascular: Denies chest pain. Respiratory: Positive shortness of breath and cough. Gastrointestinal: No abdominal pain.  No nausea, no vomiting.  No diarrhea.  No constipation. Genitourinary: Negative for dysuria. Musculoskeletal: Negative for back pain. Skin: Negative for rash. Neurological: Negative for headaches, focal weakness or numbness.  ____________________________________________   PHYSICAL EXAM:  VITAL SIGNS: Vitals:   04/10/20 1354 04/10/20 1617  BP: 110/61 112/60  Pulse: 100 98  Resp: 18 18  Temp: 97.9 F (36.6 C)   SpO2: 100% 99%      Constitutional:  Alert and oriented.  Sitting up in wheelchair on home nasal cannula.  Conversational full sentences.  Frequently sniffling aqnd blowing her nose with bedside tissues. Eyes: Conjunctivae are normal. PERRL. EOMI. Head: Atraumatic. Nose: Bilateral nasal congestion with clear rhinorrhea. Mouth/Throat: Mucous membranes are moist.  Posterior oropharynx with mild and diffuse erythema.  Tonsils are 1+ bilaterally without exudate.  Uvula is midline.  No difficulty swallowing no voice change. Neck: No stridor. No cervical spine tenderness to palpation. Cardiovascular: Normal rate, regular rhythm. Grossly normal heart sounds.  Good peripheral circulation. Respiratory:  No retractions.  Diffuse and mild expiratory wheezes with slight decrease in air movement throughout.  Gastrointestinal: Soft , nondistended, nontender to palpation. No abdominal bruits. No CVA tenderness. Musculoskeletal: No lower extremity tenderness nor edema.  No joint effusions. No signs of acute trauma. Neurologic:  Normal speech and language. No gross focal neurologic deficits are appreciated. No gait instability noted. Skin:  Skin is warm, dry and intact. No rash noted. Psychiatric: Mood and affect are normal. Speech and behavior are normal.  ____________________________________________   LABS (all labs  ordered are listed, but only abnormal results are displayed)  Labs Reviewed  BASIC METABOLIC PANEL - Abnormal; Notable for the following components:      Result Value   Creatinine, Ser 0.37 (*)    Calcium 8.5 (*)    All other components within normal limits  CBC WITH DIFFERENTIAL/PLATELET - Abnormal; Notable for the following components:   WBC 3.7 (*)    RBC 3.03 (*)    Hemoglobin 7.9 (*)    HCT 25.6 (*)    Platelets 138 (*)    nRBC 0.5 (*)    Neutro Abs 1.6 (*)    All other components within normal limits  SARS CORONAVIRUS 2 BY RT PCR (HOSPITAL ORDER, [facility])    ____________________________________________  RADIOLOGY  ED MD interpretation: 2 view CXR reviewed without evidence of acute cardiopulmonary pathology.  Official radiology report(s): DG Chest 2 View  Result Date: 04/10/2020 CLINICAL DATA:  Cough, shortness of breath. EXAM: CHEST - 2 VIEW COMPARISON:  September 20, 2019. FINDINGS: The heart size and mediastinal contours are within normal limits. Both lungs are clear. No pneumothorax or pleural effusion is noted. The visualized skeletal structures are unremarkable. IMPRESSION: No active cardiopulmonary disease. Aortic Atherosclerosis (ICD10-I70.0). Electronically Signed   By: Marijo Conception M.D.   On: 04/10/2020 14:32    ____________________________________________   PROCEDURES and INTERVENTIONS  Procedure(s) performed (including Critical Care):  Procedures  Medications  albuterol (PROVENTIL) (2.5 MG/3ML) 0.083% nebulizer solution 3 mL (3 mLs Inhalation Given 04/10/20 1701)  acetaminophen (TYLENOL) tablet 1,000 mg (1,000 mg Oral Given 04/10/20 1700)  predniSONE (DELTASONE) tablet 20 mg (20 mg Oral Given 04/10/20 1659)    ____________________________________________   MDM / ED COURSE  69 year old smoker with questionable diagnosis of COPD presents with upper respiratory congestion and cough, most consistent with viral URI precipitating COPD exacerbation and amenable to outpatient management.  Normal vital signs on her home O2.  Exam is reassuring without distress.  Patient looks well and is conversational in full sentences.  She does have diffuse expiratory wheezes and decreased air movement throughout, concerning for exacerbation of her underlying chronic lung disease.  Provided patient albuterol MDI and prednisone with improving symptoms.  Improved pulmonary auscultation after this as well.  Swab for Covid due to her advanced age and immunosuppressed status in the setting of her multiple myeloma, and this was negative.  Her syndrome  is most consistent with a viral URI precipitating worsening of her chronic lung disease.  I see no indications for inpatient management and she is well-established with outpatient providers for follow-up.  I urged her to follow-up within a week with her PCP or pulmonologist.  We discussed return precautions for the ED and patient is medically stable for discharge home.  Clinical Course as of Apr 10 1825  Thu Apr 10, 2020  1719 Reassessed.  Patient reports improving symptoms.  Improved air movement on auscultation.  We discussed outpatient management of her likely viral illness precipitating COPD exacerbation.   [DS]    Clinical Course User Index [DS] Vladimir Crofts, MD     ____________________________________________   FINAL CLINICAL IMPRESSION(S) / ED DIAGNOSES  Final diagnoses:  Viral URI with cough  Shortness of breath     ED Discharge Orders         Ordered    predniSONE (DELTASONE) 20 MG tablet  Daily with breakfast        04/10/20 1817  Vladimir Crofts   Note:  This document was prepared using Systems analyst and may include unintentional dictation errors.   Vladimir Crofts, MD 04/10/20 [phone]

## 2020-04-15 ENCOUNTER — Telehealth: Payer: Self-pay | Admitting: *Deleted

## 2020-04-15 ENCOUNTER — Other Ambulatory Visit: Payer: Self-pay

## 2020-04-15 NOTE — Telephone Encounter (Signed)
Contacted patient via cell phone - unable to reach patient. However, contacted home line and spoke to patient prior to apt- see documentation on md encounter.

## 2020-04-16 ENCOUNTER — Other Ambulatory Visit: Payer: Self-pay

## 2020-04-16 ENCOUNTER — Inpatient Hospital Stay: Payer: Medicare Other

## 2020-04-16 ENCOUNTER — Encounter: Payer: Self-pay | Admitting: Internal Medicine

## 2020-04-16 ENCOUNTER — Inpatient Hospital Stay (HOSPITAL_BASED_OUTPATIENT_CLINIC_OR_DEPARTMENT_OTHER): Payer: Medicare Other | Admitting: Internal Medicine

## 2020-04-16 DIAGNOSIS — C9000 Multiple myeloma not having achieved remission: Secondary | ICD-10-CM | POA: Diagnosis present

## 2020-04-16 DIAGNOSIS — J9610 Chronic respiratory failure, unspecified whether with hypoxia or hypercapnia: Secondary | ICD-10-CM | POA: Diagnosis not present

## 2020-04-16 DIAGNOSIS — M8580 Other specified disorders of bone density and structure, unspecified site: Secondary | ICD-10-CM | POA: Diagnosis not present

## 2020-04-16 DIAGNOSIS — I509 Heart failure, unspecified: Secondary | ICD-10-CM | POA: Diagnosis not present

## 2020-04-16 DIAGNOSIS — R1310 Dysphagia, unspecified: Secondary | ICD-10-CM | POA: Diagnosis not present

## 2020-04-16 DIAGNOSIS — Z9981 Dependence on supplemental oxygen: Secondary | ICD-10-CM | POA: Diagnosis not present

## 2020-04-16 DIAGNOSIS — D649 Anemia, unspecified: Secondary | ICD-10-CM | POA: Diagnosis not present

## 2020-04-16 LAB — FERRITIN: Ferritin: 426 ng/mL — ABNORMAL HIGH (ref 11–307)

## 2020-04-16 LAB — CBC WITH DIFFERENTIAL/PLATELET
Abs Immature Granulocytes: 0.03 10*3/uL (ref 0.00–0.07)
Basophils Absolute: 0 10*3/uL (ref 0.0–0.1)
Basophils Relative: 0 %
Eosinophils Absolute: 0.1 10*3/uL (ref 0.0–0.5)
Eosinophils Relative: 1 %
HCT: 23.6 % — ABNORMAL LOW (ref 36.0–46.0)
Hemoglobin: 7.5 g/dL — ABNORMAL LOW (ref 12.0–15.0)
Immature Granulocytes: 0 %
Lymphocytes Relative: 42 %
Lymphs Abs: 5.1 10*3/uL — ABNORMAL HIGH (ref 0.7–4.0)
MCH: 26.5 pg (ref 26.0–34.0)
MCHC: 31.8 g/dL (ref 30.0–36.0)
MCV: 83.4 fL (ref 80.0–100.0)
Monocytes Absolute: 1 10*3/uL (ref 0.1–1.0)
Monocytes Relative: 8 %
Neutro Abs: 6 10*3/uL (ref 1.7–7.7)
Neutrophils Relative %: 49 %
Platelets: 169 10*3/uL (ref 150–400)
RBC: 2.83 MIL/uL — ABNORMAL LOW (ref 3.87–5.11)
RDW: 14.4 % (ref 11.5–15.5)
WBC: 12.3 10*3/uL — ABNORMAL HIGH (ref 4.0–10.5)
nRBC: 0.5 % — ABNORMAL HIGH (ref 0.0–0.2)

## 2020-04-16 LAB — COMPREHENSIVE METABOLIC PANEL
ALT: 39 U/L (ref 0–44)
AST: 37 U/L (ref 15–41)
Albumin: 3.4 g/dL — ABNORMAL LOW (ref 3.5–5.0)
Alkaline Phosphatase: 272 U/L — ABNORMAL HIGH (ref 38–126)
Anion gap: 8 (ref 5–15)
BUN: 13 mg/dL (ref 8–23)
CO2: 26 mmol/L (ref 22–32)
Calcium: 8.7 mg/dL — ABNORMAL LOW (ref 8.9–10.3)
Chloride: 109 mmol/L (ref 98–111)
Creatinine, Ser: <0.3 mg/dL — ABNORMAL LOW (ref 0.44–1.00)
Glucose, Bld: 103 mg/dL — ABNORMAL HIGH (ref 70–99)
Potassium: 3.1 mmol/L — ABNORMAL LOW (ref 3.5–5.1)
Sodium: 143 mmol/L (ref 135–145)
Total Bilirubin: 0.9 mg/dL (ref 0.3–1.2)
Total Protein: 6.9 g/dL (ref 6.5–8.1)

## 2020-04-16 LAB — IRON AND TIBC
Iron: 73 ug/dL (ref 28–170)
Saturation Ratios: 41 % — ABNORMAL HIGH (ref 10.4–31.8)
TIBC: 179 ug/dL — ABNORMAL LOW (ref 250–450)
UIBC: 106 ug/dL

## 2020-04-16 LAB — MAGNESIUM: Magnesium: 1.8 mg/dL (ref 1.7–2.4)

## 2020-04-16 LAB — BRAIN NATRIURETIC PEPTIDE: B Natriuretic Peptide: 1568.2 pg/mL — ABNORMAL HIGH (ref 0.0–100.0)

## 2020-04-16 MED ORDER — MONTELUKAST SODIUM 10 MG PO TABS: 10.0000 mg | ORAL_TABLET | Freq: Every day | ORAL | 3 refills | Status: DC

## 2020-04-16 MED ORDER — MEGESTROL ACETATE 625 MG/5ML PO SUSP: 625.0000 mg | Freq: Every day | ORAL | 0 refills | Status: DC

## 2020-04-16 MED ORDER — HYDROCOD POLST-CPM POLST ER 10-8 MG/5ML PO SUER
5.0000 mL | Freq: Every evening | ORAL | 0 refills | Status: DC | PRN
Start: 2020-04-16 — End: 2020-04-18

## 2020-04-16 NOTE — Progress Notes (Signed)
[place] OFFICE PROGRESS NOTE  Patient Care Team: [facility], [place] as PCP - General (General Practice) End, Harrell Gave, MD as PCP - Cardiology (Cardiology) [name], [name] [name], MD as Consulting Physician (Hematology and Oncology) Ottie Glazier, MD as Consulting Physician (Pulmonary Disease)  Cancer Staging No matching staging information was found for the patient.   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 No morphologic support for residual myeloma disease. Negative for minimal residual disease by MM-MRD flow cytometry; HOLD REVLIMID [leg swelling]; AUG 2021-PET scan negative for myeloma; continue to hold Revlimid  # MARCH 2021-diastolic congestive heart failure [Dr.End]  --------------------------  # 12/12- RIGHT JUGULAR DVT-x 367mn xarelto; finished April 2020; September 2020-EGD/dysphagia; Dr. AnVicente Males# Acute renal failure [Dr.Singh; Proteinuria 1.5gm/day ];  acyclovir/Asprin ------------------------------------------------------------------------------------------------------------   DIAGNOSIS: _0  MULTIPLE MYELOMA  STAGE: III/HIGH RISK ;GOALS: CONTROL  CURRENT/MOST RECENT THERAPY-maintenance Revlimid    [place] -  Chemotherapy   The patient had palonosetron (ALOXI) injection 0.25 mg, 0.25 mg, Intravenous,  Once, 6 of 7 cycles Administration: 0.25 mg (11/21/2017), 0.25 mg (11/29/2017), 0.25 mg (12/05/2017), 0.25 mg (12/21/2017), 0.25 mg (12/28/2017), 0.25 mg (01/04/2018), 0.25 mg (01/18/2018), 0.25 mg (01/25/2018), 0.25 mg (02/01/2018), 0.25 mg (02/15/2018), 0.25 mg (02/22/2018), 0.25 mg (03/01/2018), 0.25 mg (03/15/2018), 0.25 mg (03/22/2018), 0.25 mg (03/30/2018), 0.25 mg (04/13/2018), 0.25 mg (04/20/2018), 0.25 mg (04/27/2018) cyclophosphamide (CYTOXAN) 500 mg in sodium chloride 0.9 % 250 mL chemo infusion, 540 mg, Intravenous,  Once, 6 of 7 cycles Administration: 500 mg (11/21/2017), 500 mg (11/29/2017), 500 mg (12/05/2017), 500 mg (12/28/2017), 500 mg (01/04/2018), 500 mg (01/18/2018), 500 mg (01/25/2018), 500 mg (02/01/2018), 500 mg (02/15/2018), 500 mg (02/22/2018), 500 mg (03/01/2018), 500 mg (03/15/2018), 500 mg (03/30/2018), 500 mg (04/13/2018), 500 mg (04/20/2018), 500 mg (04/27/2018) carfilzomib (KYPROLIS) 36 mg in dextrose 5 % 50 mL chemo infusion, 20 mg/m2 = 36 mg, Intravenous, Once, 6 of 7 cycles Administration: 36 mg (11/21/2017), 36 mg (11/22/2017), 60 mg (11/29/2017), 60 mg (11/30/2017), 60 mg (12/05/2017), 60 mg (12/06/2017), 60 mg (12/21/2017), 60 mg (12/22/2017), 60 mg (12/28/2017), 60 mg (12/29/2017), 60 mg (01/04/2018), 60 mg (01/05/2018), 60 mg (01/18/2018), 60 mg (01/19/2018), 60 mg (01/25/2018), 60 mg (01/27/2018), 60 mg (02/01/2018), 60 mg (02/02/2018), 60 mg (02/15/2018), 60 mg (02/16/2018), 60 mg (02/22/2018), 60 mg (03/01/2018), 60 mg (03/02/2018), 60 mg (03/15/2018), 60 mg (03/16/2018), 60 mg (03/23/2018), 60 mg (03/30/2018), 60 mg (03/31/2018), 60 mg (04/13/2018), 60 mg (04/14/2018),  60 mg (04/20/2018), 60 mg (04/21/2018), 60 mg (04/27/2018)  for chemotherapy treatment.     INTERVAL HISTORY:  VaDIOR [id number].  female pleasant patient above history of relapsed multiple myeloma--most recently not on maintenance Revlimid [on since  Feb 2021] is here for follow-up.  Interim evaluated by ENT for goiter.   In the interim patient was evaluated in the emergency room for shortness of breath chest congestion; cough. Chest x-ray negative. Given the wheezing started on steroids.  Patient continues to feel poorly continues to complain of chest congestion. Poor appetite. Positive for difficulty swallowing. Positive for weight loss.   Review of Systems  Constitutional: Positive for malaise/fatigue and weight loss. Negative for chills, diaphoresis and fever.  HENT: Negative for nosebleeds and sore throat.   Eyes: Negative for double vision.  Respiratory: Positive for cough, sputum production and shortness of breath. Negative for hemoptysis.   Cardiovascular: Positive for leg swelling. Negative for chest pain, palpitations and orthopnea.  Gastrointestinal: Negative for abdominal pain, blood in stool, constipation, heartburn and melena.  Genitourinary: Negative for dysuria, frequency and urgency.  Musculoskeletal: Positive for back pain and myalgias.  Skin: Negative.  Negative for itching and rash.  Neurological: Negative for dizziness, tingling, focal weakness, weakness and headaches.  Endo/Heme/Allergies: Does not bruise/bleed easily.  Psychiatric/Behavioral: Negative for depression. The patient has insomnia. The patient is not nervous/anxious.     PAST MEDICAL HISTORY :  Past Medical History:  Diagnosis Date  . At risk for cardiac dysfunction   . Hypertension   . Hypokalemia   . IgA myeloma ([place])   . Multiple myeloma ([place])   . Red blood cell antibody positive, compatible PRBC difficult to obtain   . S/P autologous bone marrow transplantation ([place])     PAST  SURGICAL HISTORY :   Past Surgical History:  Procedure Laterality Date  . ESOPHAGOGASTRODUODENOSCOPY (EGD) WITH PROPOFOL N/A 03/30/2019   Procedure: ESOPHAGOGASTRODUODENOSCOPY (EGD) WITH PROPOFOL;  Surgeon: Jonathon Bellows, MD;  Location: [facility] ENDOSCOPY;  Service: Gastroenterology;  Laterality: N/A;  . IR FLUORO GUIDE PORT INSERTION RIGHT  12/16/2017    FAMILY HISTORY :   Family History  Problem Relation Age of Onset  . Pneumonia Mother   . Seizures Father     SOCIAL HISTORY:   Social History   Tobacco Use  . Smoking status: Former Smoker    Packs/day: 0.50    Types: Cigarettes  . Smokeless tobacco: Never Used  . Tobacco comment: 2 cigarettes QOD  Vaping Use  . Vaping Use: Never used  Substance Use Topics  . Alcohol use: No  . Drug use: No    ALLERGIES:  has No Known Allergies.  MEDICATIONS:  Current Outpatient Medications  Medication Sig Dispense Refill  . acyclovir (ZOVIRAX) 400 MG tablet TAKE 1 TABLET BY MOUTH ONCE DAILY TO  PREVENT  SHINGLES 30 tablet 0  . albuterol (VENTOLIN HFA) 108 (90 Base) MCG/ACT inhaler Inhale 2 puffs into the lungs every 6 (six) hours as needed for wheezing or shortness of breath. 8 g 3  . aspirin EC 81 MG EC tablet Take 1 tablet (81 mg total) by mouth daily. 90 tablet 3  . ergocalciferol (VITAMIN D2) 1.25 MG (50000 UT) capsule Take 1 capsule (50,000 Units total) by mouth once a week. 12 capsule 2  . feeding supplement, ENSURE ENLIVE, (ENSURE ENLIVE) LIQD Take 237 mLs by mouth 2 (two) times daily between meals. 60 Bottle 0  . Fluticasone-Salmeterol (ADVAIR DISKUS) 500-50 MCG/DOSE AEPB Inhale 1 puff into the lungs 2 (two) times daily. 1 each 3  . furosemide (LASIX) 40 MG tablet Take 1 tablet (40 mg total) by mouth daily. 90 tablet 3  . ipratropium-albuterol (DUONEB) 0.5-2.5 (3) MG/3ML SOLN Take 3 mLs by nebulization  every 4 (four) hours as needed. 360 mL 3  . metoprolol succinate (TOPROL XL) 25 MG 24 hr tablet Take 1.5 tablets (37.5 mg total) by mouth  daily. 45 tablet 3  . pantoprazole (PROTONIX) 40 MG tablet Take 1 tablet by mouth once daily 90 tablet 0  . polyethylene glycol (MIRALAX / GLYCOLAX) packet Take 17 g by mouth daily as needed for mild constipation. 14 each 0  . potassium chloride SA (KLOR-CON) 20 MEQ tablet Take 1 tablet (20 mEq total) by mouth 2 (two) times daily. 60 tablet 6  . spironolactone (ALDACTONE) 25 MG tablet Take 1 tablet (25 mg total) by mouth daily. 90 tablet 3  . chlorpheniramine-HYDROcodone (TUSSIONEX) 10-8 MG/5ML SUER Take 5 mLs by mouth at bedtime as needed for cough. 140 mL 0  . megestrol (MEGACE ES) 625 MG/5ML suspension Take 5 mLs (625 mg total) by mouth daily. 150 mL 0  . montelukast (SINGULAIR) 10 MG tablet Take 1 tablet (10 mg total) by mouth at bedtime. 30 tablet 3   No current facility-administered medications for this visit.   Facility-Administered Medications Ordered in Other Visits  Medication Dose Route Frequency Provider Last Rate Last Admin  . sodium chloride flush (NS) 0.9 % injection 10 mL  10 mL Intracatheter PRN [name], MD   10 mL at 02/23/18 1400    PHYSICAL EXAMINATION: ECOG PERFORMANCE STATUS: 1 - Symptomatic but completely ambulatory  BP 119/60 (BP Location: Left Arm, Patient Position: Sitting, Cuff Size: Normal)   Pulse 92   Temp 99.1 F (37.3 C) (Tympanic)   Resp 18   Ht $R'5\' 2"'Ng$  (1.575 m)   Wt 111 lb 9.6 oz (50.6 kg)   SpO2 100%   BMI 20.41 kg/m   Filed Weights   04/16/20 1028  Weight: 111 lb 9.6 oz (50.6 kg)    Physical Exam Constitutional:      Comments: Cachectic female patient in a wheelchair. She is accompanied by her daughter. She is on 2 L nasal cannula.  HENT:     Head: Normocephalic and atraumatic.  Eyes:     Pupils: Pupils are equal, round, and reactive to light.  Cardiovascular:     Rate and Rhythm: Normal rate and regular rhythm.  Pulmonary:     Effort: Pulmonary effort is normal. No respiratory distress.     Breath sounds: Normal breath  sounds. No wheezing.  Abdominal:     General: Bowel sounds are normal. There is no distension.     Palpations: Abdomen is soft. There is no mass.     Tenderness: There is no abdominal tenderness. There is no guarding or rebound.  Musculoskeletal:        General: No tenderness. Normal range of motion.     Cervical back: Normal range of motion and neck supple.  Skin:    General: Skin is warm.  Neurological:     Mental Status: She is alert and oriented to person, place, and time.  Psychiatric:        Mood and Affect: Affect normal.    LABORATORY DATA:  I have reviewed the data as listed    Component Value Date/Time   NA 143 04/16/2020 1014   NA 141 01/25/2020 1530   K 3.1 (L) 04/16/2020 1014   CL 109 04/16/2020 1014   CO2 26 04/16/2020 1014   GLUCOSE 103 (H) 04/16/2020 1014   BUN 13 04/16/2020 1014   BUN 19 01/25/2020 1530   CREATININE <0.30 (L) 04/16/2020 1014  CALCIUM 8.7 (L) 04/16/2020 1014   PROT 6.9 04/16/2020 1014   ALBUMIN 3.4 (L) 04/16/2020 1014   AST 37 04/16/2020 1014   ALT 39 04/16/2020 1014   ALKPHOS 272 (H) 04/16/2020 1014   BILITOT 0.9 04/16/2020 1014   GFRNONAA NOT CALCULATED 04/16/2020 1014   GFRAA NOT CALCULATED 04/16/2020 1014    No results found for: SPEP, UPEP  Lab Results  Component Value Date   WBC 12.3 (H) 04/16/2020   NEUTROABS 6.0 04/16/2020   HGB 7.5 (L) 04/16/2020   HCT 23.6 (L) 04/16/2020   MCV 83.4 04/16/2020   PLT 169 04/16/2020      Chemistry      Component Value Date/Time   NA 143 04/16/2020 1014   NA 141 01/25/2020 1530   K 3.1 (L) 04/16/2020 1014   CL 109 04/16/2020 1014   CO2 26 04/16/2020 1014   BUN 13 04/16/2020 1014   BUN 19 01/25/2020 1530   CREATININE <0.30 (L) 04/16/2020 1014      Component Value Date/Time   CALCIUM 8.7 (L) 04/16/2020 1014   ALKPHOS 272 (H) 04/16/2020 1014   AST 37 04/16/2020 1014   ALT 39 04/16/2020 1014   BILITOT 0.9 04/16/2020 1014       RADIOGRAPHIC STUDIES: I have personally  reviewed the radiological images as listed and agreed with the findings in the report. No results found.   ASSESSMENT & PLAN:  IgA myeloma ([place]) #Multiple myeloma stage III high-risk cytogenetics-status post KRD-VGPR ; status post autologousstem cell transplant on 06/15/18. FEB 2021- [facility]- NEGATIVE [[facility],Dr.G on 11/22/2019]. PET scan-August 2021-no evidence of any recurrent myeloma.  Myeloma panel; kappa lambda light chain ratio March 06, 2020-negative/normal; However, worsening anemia- Hb 7.5 today. HOLD revlimid sec to acute issues [see below- severe anemia/COPD]  # Etiology of anemia is unclear-reviewed the bone marrow biopsy at [facility] February 2021-no evidence of myeloma; normal hematopoiesis. Recommend checking iron studies. Recommend repeating bone marrow biopsy in approximately 2 weeks. Check iron studies ferritin today. Repeat CBC 1 week; if less than 8 recommend PRBC transfusion.  # Difficulty swallowing dysphagia-multinodular goiter s/p ENT evaluation- Dr.Bennett- will discuss regarding treatment plan.   # chronic respiratory failure- sec to COPD/diastolic CHF on diuretics nebulizer home O2; recommend with pulmonary, Dr.A. Will discuss with Dr. Lanney Gins. Given the congestion-recommend Singulair. Tussionex for cough.  # Bone lesions /last zometa on 11/27/2017.  Holding Zometa secondary to poor dentition.  Stable on PET scan. STABLE  #Poor appetite-recommend Megace.  # DISPOSITION: check iron studies/ferrtin; BNP # Bone marrow in 2 weeks # 1 week- cbc/HOLD tube; 1 unit PRBC transfusion # Follow up 3 weeks- MD; labs-cbc/ cbc/cmp/HOLD tube;-Dr.B   Orders Placed This Encounter  Procedures  . CT BONE MARROW BIOPSY & ASPIRATION    Standing Status:   Future    Standing Expiration Date:   04/16/2021    Order Specific Question:   Reason for Exam (SYMPTOM  OR DIAGNOSIS REQUIRED)    Answer:   severe anemia/ Hx of multiple myeloma    Order Specific Question:   Preferred imaging  location?    Answer:   [facility]    Order Specific Question:   Radiology Contrast Protocol - do NOT remove file path    Answer:   [email or web address]  . Ferritin    Standing Status:   Future    Number of Occurrences:   1    Standing Expiration Date:   04/16/2021  . Iron and TIBC    Standing  Status:   Future    Number of Occurrences:   1    Standing Expiration Date:   04/16/2021  . Brain natriuretic peptide    Standing Status:   Future    Number of Occurrences:   1    Standing Expiration Date:   04/16/2021  . CBC with Differential    Standing Status:   Standing    Number of Occurrences:   2    Standing Expiration Date:   04/16/2021  . Comprehensive metabolic panel    Standing Status:   Future    Standing Expiration Date:   04/16/2021  . Hold Tube- Blood Bank    Standing Status:   Standing    Number of Occurrences:   2    Standing Expiration Date:   04/16/2021   All questions were answered. The patient knows to call the clinic with any problems, questions or concerns.      [name], MD 04/16/2020 1:02 PM

## 2020-04-16 NOTE — Progress Notes (Signed)
Has a productive x2 weeks. Has increased SOB and some wheezing. Has used inhaler which has had some relief.

## 2020-04-16 NOTE — Assessment & Plan Note (Addendum)
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 FEB 2021- [facility]- NEGATIVE [[facility],Dr.G on 11/22/2019]. PET scan-August 2021-no evidence of any recurrent myeloma.  Myeloma panel; kappa lambda light chain ratio March 06, 2020-negative/normal; However, worsening anemia- Hb 7.5 today. HOLD revlimid sec to acute issues [see below- severe anemia/COPD]  # Etiology of anemia is unclear-reviewed the bone marrow biopsy at [facility] February 2021-no evidence of myeloma; normal hematopoiesis. Recommend checking iron studies. Recommend repeating bone marrow biopsy in approximately 2 weeks. Check iron studies ferritin today. Repeat CBC 1 week; if less than 8 recommend PRBC transfusion.  # Difficulty swallowing dysphagia-multinodular goiter s/p ENT evaluation- Dr.Bennett- will discuss regarding treatment plan.   # chronic respiratory failure- sec to COPD/diastolic CHF on diuretics nebulizer home O2; recommend with pulmonary, Dr.A. Will discuss with Dr. Lanney Gins. Given the congestion-recommend Singulair. Tussionex for cough.  # Bone lesions /last zometa on 11/27/2017.  Holding Zometa secondary to poor dentition.  Stable on PET scan. STABLE  #Poor appetite-recommend Megace.  # DISPOSITION: check iron studies/ferrtin; BNP # Bone marrow in 2 weeks # 1 week- cbc/HOLD tube; 1 unit PRBC transfusion # Follow up 3 weeks- MD; labs-cbc/ cbc/cmp/HOLD tube;-Dr.B  # 40 minutes face-to-face with the patient discussing the above plan of care; more than 50% of time spent on prognosis/ natural history; counseling and coordination.

## 2020-04-16 NOTE — Patient Instructions (Addendum)
#   call Dr.Aleskerov's office [Ph: ] for appt for breathing problems.   Ottie Glazier, MD  [address] [place], [place] [address]  [phone]

## 2020-04-17 ENCOUNTER — Other Ambulatory Visit: Payer: Self-pay | Admitting: *Deleted

## 2020-04-17 ENCOUNTER — Ambulatory Visit
Admission: RE | Admit: 2020-04-17 | Discharge: 2020-04-17 | Disposition: A | Discharge status: Home / Self Care | Payer: Medicare Other | Source: Ambulatory Visit | Attending: Nurse Practitioner | Admitting: Nurse Practitioner

## 2020-04-17 ENCOUNTER — Inpatient Hospital Stay (HOSPITAL_BASED_OUTPATIENT_CLINIC_OR_DEPARTMENT_OTHER): Payer: Medicare Other | Admitting: Nurse Practitioner

## 2020-04-17 ENCOUNTER — Encounter: Payer: Self-pay | Admitting: Nurse Practitioner

## 2020-04-17 ENCOUNTER — Telehealth: Payer: Self-pay | Admitting: *Deleted

## 2020-04-17 ENCOUNTER — Other Ambulatory Visit: Payer: Self-pay

## 2020-04-17 ENCOUNTER — Inpatient Hospital Stay: Payer: Medicare Other

## 2020-04-17 VITALS — BP 129/57 | HR 103 | Temp 98.7°F | Resp 20

## 2020-04-17 DIAGNOSIS — J40 Bronchitis, not specified as acute or chronic: Secondary | ICD-10-CM

## 2020-04-17 DIAGNOSIS — D649 Anemia, unspecified: Secondary | ICD-10-CM

## 2020-04-17 DIAGNOSIS — R079 Chest pain, unspecified: Secondary | ICD-10-CM

## 2020-04-17 DIAGNOSIS — E042 Nontoxic multinodular goiter: Secondary | ICD-10-CM

## 2020-04-17 DIAGNOSIS — C9000 Multiple myeloma not having achieved remission: Secondary | ICD-10-CM

## 2020-04-17 LAB — BASIC METABOLIC PANEL
Anion gap: 8 (ref 5–15)
BUN: 13 mg/dL (ref 8–23)
CO2: 24 mmol/L (ref 22–32)
Calcium: 8.7 mg/dL — ABNORMAL LOW (ref 8.9–10.3)
Chloride: 109 mmol/L (ref 98–111)
Creatinine, Ser: <0.3 mg/dL — ABNORMAL LOW (ref 0.44–1.00)
Glucose, Bld: 106 mg/dL — ABNORMAL HIGH (ref 70–99)
Potassium: 3 mmol/L — ABNORMAL LOW (ref 3.5–5.1)
Sodium: 141 mmol/L (ref 135–145)

## 2020-04-17 LAB — CBC WITH DIFFERENTIAL/PLATELET
Abs Immature Granulocytes: 0.04 10*3/uL (ref 0.00–0.07)
Basophils Absolute: 0 10*3/uL (ref 0.0–0.1)
Basophils Relative: 0 %
Eosinophils Absolute: 0.1 10*3/uL (ref 0.0–0.5)
Eosinophils Relative: 1 %
HCT: 23.2 % — ABNORMAL LOW (ref 36.0–46.0)
Hemoglobin: 7.3 g/dL — ABNORMAL LOW (ref 12.0–15.0)
Immature Granulocytes: 0 %
Lymphocytes Relative: 29 %
Lymphs Abs: 3.2 10*3/uL (ref 0.7–4.0)
MCH: 26.3 pg (ref 26.0–34.0)
MCHC: 31.5 g/dL (ref 30.0–36.0)
MCV: 83.5 fL (ref 80.0–100.0)
Monocytes Absolute: 1 10*3/uL (ref 0.1–1.0)
Monocytes Relative: 9 %
Neutro Abs: 6.5 10*3/uL (ref 1.7–7.7)
Neutrophils Relative %: 61 %
Platelets: 185 10*3/uL (ref 150–400)
RBC: 2.78 MIL/uL — ABNORMAL LOW (ref 3.87–5.11)
RDW: 14.6 % (ref 11.5–15.5)
WBC: 10.8 10*3/uL — ABNORMAL HIGH (ref 4.0–10.5)
nRBC: 0.5 % — ABNORMAL HIGH (ref 0.0–0.2)

## 2020-04-17 LAB — PREPARE RBC (CROSSMATCH)

## 2020-04-17 LAB — CK: Total CK: 19 U/L — ABNORMAL LOW (ref 38–234)

## 2020-04-17 LAB — MAGNESIUM: Magnesium: 1.6 mg/dL — ABNORMAL LOW (ref 1.7–2.4)

## 2020-04-17 LAB — BRAIN NATRIURETIC PEPTIDE: B Natriuretic Peptide: 1495.1 pg/mL — ABNORMAL HIGH (ref 0.0–100.0)

## 2020-04-17 LAB — KAPPA/LAMBDA LIGHT CHAINS
Kappa free light chain: 24.4 mg/L — ABNORMAL HIGH (ref 3.3–19.4)
Kappa, lambda light chain ratio: 0.76 (ref 0.26–1.65)
Lambda free light chains: 32.2 mg/L — ABNORMAL HIGH (ref 5.7–26.3)

## 2020-04-17 LAB — SAMPLE TO BLOOD BANK

## 2020-04-17 MED ORDER — FUROSEMIDE 10 MG/ML IJ SOLN
40.0000 mg | Freq: Once | INTRAMUSCULAR | Status: AC
  Administered 2020-04-17: 40 mg via INTRAVENOUS
  Filled 2020-04-17: qty 4

## 2020-04-17 MED ORDER — SODIUM CHLORIDE 0.9% FLUSH
20.0000 mL | Freq: Once | INTRAVENOUS | Status: AC
  Administered 2020-04-17: 20 mL via INTRAVENOUS
  Filled 2020-04-17: qty 20

## 2020-04-17 NOTE — Progress Notes (Signed)
[facility]pt[facility] [place]  [phone] [phone]  Patient Care Team: [facility], [place] as PCP - General (General Practice) End, Harrell Gave, MD as PCP - Cardiology (Cardiology) [name], [name] [name], MD as Consulting Physician (Hematology and Oncology) Ottie Glazier, MD as Consulting Physician (Pulmonary Disease)   Name of the patient: Brandi [name]  [id number]  1950/10/14   Date of visit: 04/17/20  Diagnosis- Multiple Myeloma  Chief complaint/ Reason for visit- shortness of breath  Heme/Onc history:  Oncology History Overview Note  # SEP 2018- MULTIPLE MYELOMA [facility] [2.5 gm/dl; K/L= 88/1298]; STAGE III [beta 2 microglobulin=5.5] [presented with acute renal failure; anemia; NO hypercalcemia; Skeletal survey-Normal]; BMBx- 45% plasma cells; FISH-POSITIVE 11:14 translocation.[STANDARD-high RISK]/cyto-Normal; SEP 2018- PET- L3 posterior element lesion.   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 No morphologic support for residual myeloma disease. Negative for minimal residual disease by MM-MRD flow cytometry; HOLD REVLIMID [leg swelling]; AUG 2021-PET scan negative for myeloma; continue to hold Revlimid  # MARCH 2021-diastolic congestive heart failure [Dr.End]  --------------------------  # 12/12- RIGHT JUGULAR DVT-x 335mn xarelto; finished April  2020; September 2020-EGD/dysphagia; Dr. 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 -  Chemotherapy   The patient had palonosetron (ALOXI) injection 0.25 mg, 0.25 mg, Intravenous,  Once, 6 of 7 cycles Administration: 0.25 mg (11/21/2017), 0.25 mg (11/29/2017), 0.25 mg (12/05/2017), 0.25 mg (12/21/2017), 0.25 mg (12/28/2017), 0.25 mg (01/04/2018), 0.25 mg (01/18/2018), 0.25 mg (01/25/2018), 0.25 mg (02/01/2018), 0.25 mg (02/15/2018), 0.25 mg (02/22/2018), 0.25 mg (03/01/2018), 0.25 mg (03/15/2018), 0.25 mg (03/22/2018), 0.25 mg (03/30/2018), 0.25 mg (04/13/2018), 0.25 mg (04/20/2018), 0.25 mg (04/27/2018) cyclophosphamide (CYTOXAN) 500 mg in sodium chloride 0.9 % 250 mL chemo infusion, 540 mg, Intravenous,  Once, 6 of 7 cycles Administration: 500 mg (11/21/2017), 500 mg (11/29/2017), 500 mg (12/05/2017), 500 mg (12/28/2017), 500 mg (01/04/2018), 500 mg (01/18/2018), 500 mg (01/25/2018), 500 mg (02/01/2018), 500 mg (02/15/2018), 500 mg (02/22/2018), 500 mg (03/01/2018), 500 mg (03/15/2018), 500 mg (03/30/2018), 500 mg (04/13/2018), 500 mg (04/20/2018), 500 mg (04/27/2018) carfilzomib (KYPROLIS) 36 mg in dextrose 5 % 50 mL chemo infusion, 20 mg/m2 = 36 mg, Intravenous, Once, 6 of 7 cycles Administration: 36 mg (11/21/2017), 36 mg (11/22/2017), 60 mg (11/29/2017), 60 mg (11/30/2017), 60 mg (12/05/2017), 60 mg (12/06/2017), 60 mg (12/21/2017), 60 mg (12/22/2017), 60 mg (12/28/2017), 60 mg (12/29/2017), 60 mg (01/04/2018), 60 mg (01/05/2018), 60 mg (01/18/2018), 60 mg (01/19/2018), 60 mg (01/25/2018), 60 mg (01/27/2018), 60 mg (02/01/2018), 60 mg (02/02/2018), 60 mg (02/15/2018), 60 mg (02/16/2018), 60 mg (02/22/2018), 60 mg (03/01/2018), 60 mg (03/02/2018), 60 mg (03/15/2018), 60 mg  (03/16/2018), 60 mg (03/23/2018), 60 mg (03/30/2018), 60 mg (03/31/2018), 60 mg (04/13/2018), 60 mg (04/14/2018), 60 mg (04/20/2018), 60 mg (04/21/2018), 60 mg (04/27/2018)  for chemotherapy treatment.  Interval history-69 year old patient with history of multiple myeloma s/p Velcade and 3 cycles of RVD with partial response and autologous stem cell transplant currently on revlimid, on hold due to acute issues, presents to symptom management clinic for complaints of cough, difficulty swallowing, chest pain with sensation of food getting stuck. She says it is worse. History of goiter.  She was seen by oncology and pulmonology yesterday.  Has received her first Covid vaccine.  Started Combivent and oxygen few weeks ago and felt better but now feels worse.  Has chronic lower extremity edema that she feels is slightly improved.  Has a history of asthma with recurrent bronchitis.  Current smoker. She was started on prednisone and azithromycin by pulmonology. Started on megace, singulair. On oxygen.    Review of systems- Review of Systems  Constitutional: Positive for malaise/fatigue and weight loss. Negative for chills and fever.  HENT: Positive for congestion and sore throat. Negative for hearing loss, nosebleeds and tinnitus.   Eyes: Negative for blurred vision and double vision.  Respiratory: Positive for cough and shortness of breath. Negative for hemoptysis and wheezing.   Cardiovascular: Negative for chest pain, palpitations and leg swelling.  Gastrointestinal: Negative for abdominal pain, blood in stool, constipation, diarrhea, melena, nausea and vomiting.  Genitourinary: Negative for dysuria and urgency.  Musculoskeletal: Positive for myalgias. Negative for back pain, falls and joint pain.  Skin: Negative for itching and rash.  Neurological: Negative for dizziness, tingling, sensory change, loss of consciousness, weakness and headaches.  Endo/Heme/Allergies: Negative for environmental allergies.  Does not bruise/bleed easily.  Psychiatric/Behavioral: Negative for depression. The patient is not nervous/anxious and does not have insomnia.     No Known Allergies  Past Medical History:  Diagnosis Date  . At risk for cardiac dysfunction   . Hypertension   . Hypokalemia   . IgA myeloma ([place])   . Multiple myeloma ([place])   . Red blood cell antibody positive, compatible PRBC difficult to obtain   . S/P autologous bone marrow transplantation [facility])     Past Surgical History:  Procedure Laterality Date  . ESOPHAGOGASTRODUODENOSCOPY (EGD) WITH PROPOFOL N/A 03/30/2019   Procedure: ESOPHAGOGASTRODUODENOSCOPY (EGD) WITH PROPOFOL;  Surgeon: Jonathon Bellows, MD;  Location: [facility] ENDOSCOPY;  Service: Gastroenterology;  Laterality: N/A;  . IR FLUORO GUIDE PORT INSERTION RIGHT  12/16/2017    Social History   Socioeconomic History  . Marital status: Single    Spouse name: Not on file  . Number of children: Not on file  . Years of education: Not on file  . Highest education level: Not on file  Occupational History  . Not on file  Tobacco Use  . Smoking status: Former Smoker    Packs/day: 0.50    Types: Cigarettes  . Smokeless tobacco: Never Used  . Tobacco comment: 2 cigarettes QOD  Vaping Use  . Vaping Use: Never used  Substance and Sexual Activity  . Alcohol use: No  . Drug use: No  . Sexual activity: Not on file  Other Topics Concern  . Not on file  Social History Narrative   Lives at home with family. Independent at baseline   Social Determinants of Health   Financial Resource Strain:   . Difficulty of Paying Living Expenses: Not on file  Food Insecurity:   . Worried About Charity fundraiser in the Last Year: Not on file  . Ran Out of Food in the Last Year: Not on file  Transportation Needs:   . Lack of Transportation (  Medical): Not on file  . Lack of Transportation (Non-Medical): Not on file  Physical Activity:   . Days of Exercise per Week: Not on file  . Minutes of  Exercise per Session: Not on file  Stress:   . Feeling of Stress : Not on file  Social Connections:   . Frequency of Communication with Friends and Family: Not on file  . Frequency of Social Gatherings with Friends and Family: Not on file  . Attends Religious Services: Not on file  . Active Member of Clubs or Organizations: Not on file  . Attends Archivist Meetings: Not on file  . Marital Status: Not on file  Intimate Partner Violence:   . Fear of Current or Ex-Partner: Not on file  . Emotionally Abused: Not on file  . Physically Abused: Not on file  . Sexually Abused: Not on file    Family History  Problem Relation Age of Onset  . Pneumonia Mother   . Seizures Father      Current Outpatient Medications:  .  acyclovir (ZOVIRAX) 400 MG tablet, TAKE 1 TABLET BY MOUTH ONCE DAILY TO  PREVENT  SHINGLES, Disp: 30 tablet, Rfl: 0 .  albuterol (VENTOLIN HFA) 108 (90 Base) MCG/ACT inhaler, Inhale 2 puffs into the lungs every 6 (six) hours as needed for wheezing or shortness of breath., Disp: 8 g, Rfl: 3 .  aspirin EC 81 MG EC tablet, Take 1 tablet (81 mg total) by mouth daily., Disp: 90 tablet, Rfl: 3 .  chlorpheniramine-HYDROcodone (TUSSIONEX) 10-8 MG/5ML SUER, Take 5 mLs by mouth at bedtime as needed for cough., Disp: 140 mL, Rfl: 0 .  ergocalciferol (VITAMIN D2) 1.25 MG (50000 UT) capsule, Take 1 capsule (50,000 Units total) by mouth once a week., Disp: 12 capsule, Rfl: 2 .  feeding supplement, ENSURE ENLIVE, (ENSURE ENLIVE) LIQD, Take 237 mLs by mouth 2 (two) times daily between meals., Disp: 60 Bottle, Rfl: 0 .  Fluticasone-Salmeterol (ADVAIR DISKUS) 500-50 MCG/DOSE AEPB, Inhale 1 puff into the lungs 2 (two) times daily., Disp: 1 each, Rfl: 3 .  furosemide (LASIX) 40 MG tablet, Take 1 tablet (40 mg total) by mouth daily., Disp: 90 tablet, Rfl: 3 .  ipratropium-albuterol (DUONEB) 0.5-2.5 (3) MG/3ML SOLN, Take 3 mLs by nebulization every 4 (four) hours as needed., Disp: 360 mL,  Rfl: 3 .  megestrol (MEGACE ES) 625 MG/5ML suspension, Take 5 mLs (625 mg total) by mouth daily., Disp: 150 mL, Rfl: 0 .  metoprolol succinate (TOPROL XL) 25 MG 24 hr tablet, Take 1.5 tablets (37.5 mg total) by mouth daily., Disp: 45 tablet, Rfl: 3 .  montelukast (SINGULAIR) 10 MG tablet, Take 1 tablet (10 mg total) by mouth at bedtime., Disp: 30 tablet, Rfl: 3 .  pantoprazole (PROTONIX) 40 MG tablet, Take 1 tablet by mouth once daily, Disp: 90 tablet, Rfl: 0 .  polyethylene glycol (MIRALAX / GLYCOLAX) packet, Take 17 g by mouth daily as needed for mild constipation., Disp: 14 each, Rfl: 0 .  potassium chloride SA (KLOR-CON) 20 MEQ tablet, Take 1 tablet (20 mEq total) by mouth 2 (two) times daily., Disp: 60 tablet, Rfl: 6 .  spironolactone (ALDACTONE) 25 MG tablet, Take 1 tablet (25 mg total) by mouth daily., Disp: 90 tablet, Rfl: 3 No current facility-administered medications for this visit.  Facility-Administered Medications Ordered in Other Visits:  .  sodium chloride flush (NS) 0.9 % injection 10 mL, 10 mL, Intracatheter, PRN, [name], MD, 10 mL at 02/23/18 1400  Physical exam:  Vitals:   04/17/20 1415 04/17/20 1419  BP: (!) 129/57   Pulse:  (!) 103  Resp: 20   Temp: 100.2 F (37.9 C) 98.7 F (37.1 C)  TempSrc: Tympanic Oral  SpO2:  100%   Physical Exam Constitutional:      Appearance: She is well-developed.  HENT:     Head: Atraumatic.     Nose: Nose normal.     Mouth/Throat:     Pharynx: No oropharyngeal exudate.  Eyes:     General: No scleral icterus.    Conjunctiva/sclera: Conjunctivae normal.     Pupils: Pupils are equal, round, and reactive to light.  Cardiovascular:     Rate and Rhythm: Normal rate and regular rhythm.  Pulmonary:     Effort: Pulmonary effort is normal. No tachypnea or respiratory distress.     Breath sounds: Decreased breath sounds present.  Abdominal:     General: Bowel sounds are normal. There is no distension.     Palpations:  Abdomen is soft.  Musculoskeletal:        General: Normal range of motion.     Cervical back: Normal range of motion and neck supple.  Skin:    General: Skin is warm and dry.  Neurological:     Mental Status: She is alert and oriented to person, place, and time.  Psychiatric:        Mood and Affect: Mood normal.        Behavior: Behavior normal.      CMP Latest Ref Rng & Units 04/16/2020  Glucose 70 - 99 mg/dL 103(H)  BUN 8 - 23 mg/dL 13  Creatinine 0.44 - 1.00 mg/dL <0.30(L)  Sodium 135 - 145 mmol/L 143  Potassium 3.5 - 5.1 mmol/L 3.1(L)  Chloride 98 - 111 mmol/L 109  CO2 22 - 32 mmol/L 26  Calcium 8.9 - 10.3 mg/dL 8.7(L)  Total Protein 6.5 - 8.1 g/dL 6.9  Total Bilirubin 0.3 - 1.2 mg/dL 0.9  Alkaline Phos 38 - 126 U/L 272(H)  AST 15 - 41 U/L 37  ALT 0 - 44 U/L 39   CBC Latest Ref Rng & Units 04/17/2020  WBC 4.0 - 10.5 K/uL 10.8(H)  Hemoglobin 12.0 - 15.0 g/dL 7.3(L)  Hematocrit 36 - 46 % 23.2(L)  Platelets 150 - 400 K/uL 185    No images are attached to the encounter.  DG Chest 2 View  Result Date: 04/10/2020 CLINICAL DATA:  Cough, shortness of breath. EXAM: CHEST - 2 VIEW COMPARISON:  September 20, 2019. FINDINGS: The heart size and mediastinal contours are within normal limits. Both lungs are clear. No pneumothorax or pleural effusion is noted. The visualized skeletal structures are unremarkable. IMPRESSION: No active cardiopulmonary disease. Aortic Atherosclerosis (ICD10-I70.0). Electronically Signed   By: Marijo Conception M.D.   On: 04/10/2020 14:32    Assessment and plan- Patient is a 69 y.o. female diagnosed with multiple myeloma and acute bronchitis who presents to symptom management clinic for complaints of cough, difficulty swallowing.  Etiology of her complaints is likely multifactorial and related to acute on chronic complaints.    Anemia- likely contributing to shortness of breath and malaise. Labs today reviewed and hemoglobin worse at 7.3. Blood transfusion of  1 unit of irradiated PRBCs today or tomorrow depending on availability.   CHF- Weight is down but BNP is elevated. will plan for lasix 40 mg IV in clinic today  Acute bronchitis- diagnosed with pulmonary yesterday. Currently on steroids and azithromycin,  will plan for chest x-ray today. Continue tussionex as prescribed.   Esophagitis/Difficulty Swallowing/Goiter- EKG was consistent with previous, sinus tachycardia. Suspect difficulty swallowing, cough are contributing to chest discomfort. Dr. Rogue Bussing will discuss symptoms and hx of goiter with Dr. Hope Budds who had previously considered radioactive iodine. Will have Jennet Maduro, RD reach out to patient to discuss nutritional options given symptoms. On megace for appetite.   Plan for bone marrow as planned with follow up with Dr. Rogue Bussing for results. Patient advised to notify the clinic if there is no improvement in symptoms or if symptoms worsen.     Visit Diagnosis 1. Symptomatic anemia   2. Bronchitis   3. Multinodular goiter     Patient expressed understanding and was in agreement with this plan. She also understands that She can call clinic at any time with any questions, concerns, or complaints.   Thank you for allowing me to participate in the care of this very pleasant patient.   Beckey Rutter, DNP, AGNP-C [facility] [place]

## 2020-04-17 NOTE — Telephone Encounter (Signed)
Spoke with Daughter - patient on her way to cancer center. Per dr. Jacinto Reap - order a cbc/bmp/Hold tube/ BNP; CK; troponins; mag and ekg

## 2020-04-17 NOTE — Telephone Encounter (Signed)
Spoke with daughter [name]. The information for bx procedure was reviewed with daughter. Teach back process performed. Arrival at 730 am on 04/22/2020 for an 830 am procedure.    CT BONE MARROW BIOPSY Please arrive 60 minutes prior to appointment time. Also, please do not eat or drink anything after midnight (6-8 hours NPO) except blood pressure/heart/seizure medications. Please take with just a sip of water

## 2020-04-17 NOTE — Telephone Encounter (Signed)
Daughter called reporting that patient is having chest pain and it is a bit differ net from what she has been having. She feels like she has something in her throat she cannot get out and that she needs to belch and can't. States they were told not to go to hospital unless told by Dr B to do so. Per Dr B, patient to be seen in [facility]

## 2020-04-18 ENCOUNTER — Inpatient Hospital Stay: Payer: Medicare Other

## 2020-04-18 ENCOUNTER — Encounter: Payer: Self-pay | Admitting: Nurse Practitioner

## 2020-04-18 ENCOUNTER — Telehealth: Payer: Self-pay | Admitting: *Deleted

## 2020-04-18 ENCOUNTER — Inpatient Hospital Stay (HOSPITAL_BASED_OUTPATIENT_CLINIC_OR_DEPARTMENT_OTHER): Payer: Medicare Other | Admitting: Nurse Practitioner

## 2020-04-18 VITALS — BP 119/82 | HR 124 | Temp 97.8°F | Resp 20 | Wt 115.0 lb

## 2020-04-18 DIAGNOSIS — J209 Acute bronchitis, unspecified: Secondary | ICD-10-CM

## 2020-04-18 DIAGNOSIS — D649 Anemia, unspecified: Secondary | ICD-10-CM

## 2020-04-18 DIAGNOSIS — C9000 Multiple myeloma not having achieved remission: Secondary | ICD-10-CM | POA: Diagnosis not present

## 2020-04-18 DIAGNOSIS — E876 Hypokalemia: Secondary | ICD-10-CM | POA: Diagnosis not present

## 2020-04-18 DIAGNOSIS — E049 Nontoxic goiter, unspecified: Secondary | ICD-10-CM

## 2020-04-18 LAB — MULTIPLE MYELOMA PANEL, SERUM
Albumin SerPl Elph-Mcnc: 3.4 g/dL (ref 2.9–4.4)
Albumin/Glob SerPl: 1.2 (ref 0.7–1.7)
Alpha 1: 0.2 g/dL (ref 0.0–0.4)
Alpha2 Glob SerPl Elph-Mcnc: 0.7 g/dL (ref 0.4–1.0)
B-Globulin SerPl Elph-Mcnc: 0.8 g/dL (ref 0.7–1.3)
Gamma Glob SerPl Elph-Mcnc: 1.3 g/dL (ref 0.4–1.8)
Globulin, Total: 3 g/dL (ref 2.2–3.9)
IgA: 302 mg/dL (ref 87–352)
IgG (Immunoglobin G), Serum: 1344 mg/dL (ref 586–1602)
IgM (Immunoglobulin M), Srm: 37 mg/dL (ref 26–217)
Total Protein ELP: 6.4 g/dL (ref 6.0–8.5)

## 2020-04-18 LAB — TROPONIN T: Troponin T (Highly Sensitive): 10 ng/L (ref 0–14)

## 2020-04-18 MED ORDER — DIPHENHYDRAMINE HCL 25 MG PO CAPS
25.0000 mg | ORAL_CAPSULE | Freq: Once | ORAL | Status: AC
  Administered 2020-04-18: 25 mg via ORAL
  Filled 2020-04-18: qty 1

## 2020-04-18 MED ORDER — FUROSEMIDE 10 MG/ML IJ SOLN: 20.0000 mg | Freq: Once | INTRAMUSCULAR | Status: DC

## 2020-04-18 MED ORDER — GUAIFENESIN-CODEINE 100-10 MG/5ML PO SYRP: 5.0000 mL | ORAL_SOLUTION | Freq: Three times a day (TID) | ORAL | 0 refills | Status: DC | PRN

## 2020-04-18 MED ORDER — ACETAMINOPHEN 325 MG PO TABS
650.0000 mg | ORAL_TABLET | Freq: Once | ORAL | Status: AC
  Administered 2020-04-18: 650 mg via ORAL
  Filled 2020-04-18: qty 2

## 2020-04-18 MED ORDER — POTASSIUM CHLORIDE CRYS ER 20 MEQ PO TBCR: 20.0000 meq | EXTENDED_RELEASE_TABLET | Freq: Two times a day (BID) | ORAL | 6 refills | Status: DC

## 2020-04-18 MED ORDER — SODIUM CHLORIDE 0.9% IV SOLUTION
250.0000 mL | Freq: Once | INTRAVENOUS | Status: AC
  Administered 2020-04-18: 250 mL via INTRAVENOUS
  Filled 2020-04-18: qty 250

## 2020-04-18 NOTE — Telephone Encounter (Signed)
Insurance does not cover Tussinex and they are requesting a change to Verritussin AC. Please send new prescription for this to them if agreed

## 2020-04-18 NOTE — Progress Notes (Signed)
[facility]pt[facility] [place]  [phone] [phone]  Patient Care Team: [facility], [place] as PCP - General (General Practice) End, Harrell Gave, MD as PCP - Cardiology (Cardiology) [name], [name] [name], MD as Consulting Physician (Hematology and Oncology) Ottie Glazier, MD as Consulting Physician (Pulmonary Disease)   Name of the patient: Brandi [name]  [id number]  [birth year]   Date of visit: 04/18/20  Diagnosis-multiple myeloma  Chief complaint/ Reason for visit-symptomatic anemia  Heme/Onc history:  Oncology History Overview Note  # SEP 2018- MULTIPLE MYELOMA IgAL[facility]da [2.5 gm/dl; K/L= 88/1298]; STAGE III [beta 2 microglobulin=5.5] [presented with acute renal failure; anemia; NO hypercalcemia; Skeletal survey-Normal]; BMBx- 45% plasma cells; FISH-POSITIVE 11:14 translocation.[STANDARD-high RISK]/cyto-Normal; SEP 2018- PET- L3 posterior element lesion.   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 No morphologic support for residual myeloma disease. Negative for minimal residual disease by MM-MRD flow cytometry; HOLD REVLIMID [leg swelling]; AUG 2021-PET scan negative for myeloma; continue to hold Revlimid  # MARCH 2021-diastolic congestive heart failure [Dr.End]  --------------------------  # 12/12- RIGHT JUGULAR DVT-x 351mn xarelto; finished April  2020; September 2020-EGD/dysphagia; Dr. AnVicente Males# Acute renal failure [Dr.Singh; Proteinuria 1.5gm/day ]; acyclovir/Asprin ------------------------------------------------------------------------------------------------------------   DIAGNOSIS: '[ ]'  MULTIPLE MYELOMA  STAGE: III/HIGH RISK ;GOALS: CONTROL  CURRENT/MOST RECENT THERAPY-maintenance Revlimid    IgA myeloma ([facility] 11/07/2017 -  Chemotherapy   The patient had palonosetron (ALOXI) injection 0.25 mg, 0.25 mg, Intravenous,  Once, 6 of 7 cycles Administration: 0.25 mg (11/21/2017), 0.25 mg (11/29/2017), 0.25 mg (12/05/2017), 0.25 mg (12/21/2017), 0.25 mg (12/28/2017), 0.25 mg (01/04/2018), 0.25 mg (01/18/2018), 0.25 mg (01/25/2018), 0.25 mg (02/01/2018), 0.25 mg (02/15/2018), 0.25 mg (02/22/2018), 0.25 mg (03/01/2018), 0.25 mg (03/15/2018), 0.25 mg (03/22/2018), 0.25 mg (03/30/2018), 0.25 mg (04/13/2018), 0.25 mg (04/20/2018), 0.25 mg (04/27/2018) cyclophosphamide (CYTOXAN) 500 mg in sodium chloride 0.9 % 250 mL chemo infusion, 540 mg, Intravenous,  Once, 6 of 7 cycles Administration: 500 mg (11/21/2017), 500 mg (11/29/2017), 500 mg (12/05/2017), 500 mg (12/28/2017), 500 mg (01/04/2018), 500 mg (01/18/2018), 500 mg (01/25/2018), 500 mg (02/01/2018), 500 mg (02/15/2018), 500 mg (02/22/2018), 500 mg (03/01/2018), 500 mg (03/15/2018), 500 mg (03/30/2018), 500 mg (04/13/2018), 500 mg (04/20/2018), 500 mg (04/27/2018) carfilzomib (KYPROLIS) 36 mg in dextrose 5 % 50 mL chemo infusion, 20 mg/m2 = 36 mg, Intravenous, Once, 6 of 7 cycles Administration: 36 mg (11/21/2017), 36 mg (11/22/2017), 60 mg (11/29/2017), 60 mg (11/30/2017), 60 mg (12/05/2017), 60 mg (12/06/2017), 60 mg (12/21/2017), 60 mg (12/22/2017), 60 mg (12/28/2017), 60 mg (12/29/2017), 60 mg (01/04/2018), 60 mg (01/05/2018), 60 mg (01/18/2018), 60 mg (01/19/2018), 60 mg (01/25/2018), 60 mg (01/27/2018), 60 mg (02/01/2018), 60 mg (02/02/2018), 60 mg (02/15/2018), 60 mg (02/16/2018), 60 mg (02/22/2018), 60 mg (03/01/2018), 60 mg (03/02/2018), 60 mg (03/15/2018), 60 mg  (03/16/2018), 60 mg (03/23/2018), 60 mg (03/30/2018), 60 mg (03/31/2018), 60 mg (04/13/2018), 60 mg (04/14/2018), 60 mg (04/20/2018), 60 mg (04/21/2018), 60 mg (04/27/2018)  for chemotherapy treatment.  Interval history-patient returns to symptom management clinic today for symptomatic anemia.  She was seen in clinic yesterday.  Says she feels somewhat better today after receiving Lasix yesterday.  Cough medicine was not approved by her insurance.  Continues to have cough though slightly better but cough continues to interfere with sleep. Reports compliance with medication but daughter dsays she often misses potassium due to pill size. Drinking ensure. Unable to swallow solid food due to goiter. Chest pain has resolved. Continues to feel weak and tired.   ECOG FS:2 - Symptomatic, <50% confined to bed  Review of systems- Review of Systems  Constitutional: Positive for malaise/fatigue. Negative for chills, fever and weight loss.  HENT: Negative for hearing loss, nosebleeds, sore throat and tinnitus.        Goiter  Eyes: Negative for blurred vision and double vision.  Respiratory: Positive for cough and shortness of breath. Negative for hemoptysis and wheezing.   Cardiovascular: Positive for leg swelling (chronic). Negative for chest pain and palpitations.  Gastrointestinal: Negative for abdominal pain, blood in stool, constipation, diarrhea, melena, nausea and vomiting.  Genitourinary: Negative for dysuria, frequency and urgency.  Musculoskeletal: Negative for back pain, falls, joint pain and myalgias.  Skin: Negative for itching and rash.  Neurological: Positive for weakness. Negative for dizziness, tingling, sensory change, loss of consciousness and headaches.  Endo/Heme/Allergies: Negative for environmental allergies. Does not bruise/bleed easily.  Psychiatric/Behavioral: Negative for depression. The patient is not nervous/anxious and does not have insomnia.      Current treatment- Revlimid  held  No Known Allergies  Past Medical History:  Diagnosis Date  . At risk for cardiac dysfunction   . Hypertension   . Hypokalemia   . IgA myeloma ([place])   . Multiple myeloma ([place])   . Red blood cell antibody positive, compatible PRBC difficult to obtain   . S/P autologous bone marrow transplantation [facility])     Past Surgical History:  Procedure Laterality Date  . ESOPHAGOGASTRODUODENOSCOPY (EGD) WITH PROPOFOL N/A 03/30/2019   Procedure: ESOPHAGOGASTRODUODENOSCOPY (EGD) WITH PROPOFOL;  Surgeon: Jonathon Bellows, MD;  Location: [facility] ENDOSCOPY;  Service: Gastroenterology;  Laterality: N/A;  . IR FLUORO GUIDE PORT INSERTION RIGHT  12/16/2017    Social History   Socioeconomic History  . Marital status: Single    Spouse name: Not on file  . Number of children: Not on file  . Years of education: Not on file  . Highest education level: Not on file  Occupational History  . Not on file  Tobacco Use  . Smoking status: Former Smoker    Packs/day: 0.50    Types: Cigarettes  . Smokeless tobacco: Never Used  . Tobacco comment: 2 cigarettes QOD  Vaping Use  . Vaping Use: Never used  Substance and Sexual Activity  . Alcohol use: No  . Drug use: No  . Sexual activity: Not on file  Other Topics Concern  . Not on file  Social History Narrative   Lives at home with family. Independent at baseline   Social Determinants of Health   Financial Resource Strain:   . Difficulty of Paying Living Expenses: Not on file  Food Insecurity:   . Worried About Charity fundraiser in the Last Year: Not on file  . Ran Out of Food in the Last Year: Not on file  Transportation Needs:   . Lack of Transportation (Medical): Not on file  . Lack of Transportation (Non-Medical): Not on file  Physical Activity:   . Days of  Exercise per Week: Not on file  . Minutes of Exercise per Session: Not on file  Stress:   . Feeling of Stress : Not on file  Social Connections:   . Frequency of Communication with Friends  and Family: Not on file  . Frequency of Social Gatherings with Friends and Family: Not on file  . Attends Religious Services: Not on file  . Active Member of Clubs or Organizations: Not on file  . Attends Archivist Meetings: Not on file  . Marital Status: Not on file  Intimate Partner Violence:   . Fear of Current or Ex-Partner: Not on file  . Emotionally Abused: Not on file  . Physically Abused: Not on file  . Sexually Abused: Not on file    Family History  Problem Relation Age of Onset  . Pneumonia Mother   . Seizures Father      Current Outpatient Medications:  .  acyclovir (ZOVIRAX) 400 MG tablet, TAKE 1 TABLET BY MOUTH ONCE DAILY TO  PREVENT  SHINGLES, Disp: 30 tablet, Rfl: 0 .  albuterol (VENTOLIN HFA) 108 (90 Base) MCG/ACT inhaler, Inhale 2 puffs into the lungs every 6 (six) hours as needed for wheezing or shortness of breath., Disp: 8 g, Rfl: 3 .  aspirin EC 81 MG EC tablet, Take 1 tablet (81 mg total) by mouth daily., Disp: 90 tablet, Rfl: 3 .  chlorpheniramine-HYDROcodone (TUSSIONEX) 10-8 MG/5ML SUER, Take 5 mLs by mouth at bedtime as needed for cough., Disp: 140 mL, Rfl: 0 .  ergocalciferol (VITAMIN D2) 1.25 MG (50000 UT) capsule, Take 1 capsule (50,000 Units total) by mouth once a week., Disp: 12 capsule, Rfl: 2 .  feeding supplement, ENSURE ENLIVE, (ENSURE ENLIVE) LIQD, Take 237 mLs by mouth 2 (two) times daily between meals., Disp: 60 Bottle, Rfl: 0 .  Fluticasone-Salmeterol (ADVAIR DISKUS) 500-50 MCG/DOSE AEPB, Inhale 1 puff into the lungs 2 (two) times daily., Disp: 1 each, Rfl: 3 .  furosemide (LASIX) 40 MG tablet, Take 1 tablet (40 mg total) by mouth daily., Disp: 90 tablet, Rfl: 3 .  ipratropium-albuterol (DUONEB) 0.5-2.5 (3) MG/3ML SOLN, Take 3 mLs by nebulization every 4 (four) hours as needed., Disp: 360 mL, Rfl: 3 .  megestrol (MEGACE ES) 625 MG/5ML suspension, Take 5 mLs (625 mg total) by mouth daily., Disp: 150 mL, Rfl: 0 .  metoprolol succinate  (TOPROL XL) 25 MG 24 hr tablet, Take 1.5 tablets (37.5 mg total) by mouth daily., Disp: 45 tablet, Rfl: 3 .  montelukast (SINGULAIR) 10 MG tablet, Take 1 tablet (10 mg total) by mouth at bedtime., Disp: 30 tablet, Rfl: 3 .  pantoprazole (PROTONIX) 40 MG tablet, Take 1 tablet by mouth once daily, Disp: 90 tablet, Rfl: 0 .  polyethylene glycol (MIRALAX / GLYCOLAX) packet, Take 17 g by mouth daily as needed for mild constipation., Disp: 14 each, Rfl: 0 .  potassium chloride SA (KLOR-CON) 20 MEQ tablet, Take 1 tablet (20 mEq total) by mouth 2 (two) times daily., Disp: 60 tablet, Rfl: 6 .  spironolactone (ALDACTONE) 25 MG tablet, Take 1 tablet (25 mg total) by mouth daily., Disp: 90 tablet, Rfl: 3 No current facility-administered medications for this visit.  Facility-Administered Medications Ordered in Other Visits:  .  sodium chloride flush (NS) 0.9 % injection 10 mL, 10 mL, Intracatheter, PRN, [name], MD, 10 mL at 02/23/18 1400  Physical exam:  Vitals:   04/18/20 0924  BP: 119/82  Pulse: (!) 124  Resp: 20  Temp:  97.8 F (36.6 C)  SpO2: 100%  Weight: 115 lb (52.2 kg)   Physical Exam Constitutional:      General: She is not in acute distress. HENT:     Head: Normocephalic.     Nose: No congestion or rhinorrhea.     Mouth/Throat:     Mouth: Mucous membranes are dry.     Pharynx: Oropharynx is clear.  Eyes:     Conjunctiva/sclera: Conjunctivae normal.  Neck:     Comments: Right goiter Cardiovascular:     Rate and Rhythm: Regular rhythm. Tachycardia present.  Pulmonary:     Effort: Pulmonary effort is normal. No respiratory distress.     Breath sounds: Normal breath sounds. No wheezing or rhonchi.  Abdominal:     General: There is no distension.     Palpations: Abdomen is soft.     Tenderness: There is no abdominal tenderness. There is no guarding.  Musculoskeletal:     Right lower leg: Edema present.     Left lower leg: Edema present.  Skin:    General: Skin is  warm and dry.  Neurological:     Mental Status: She is alert and oriented to person, place, and time.  Psychiatric:        Mood and Affect: Mood normal.        Behavior: Behavior normal.      CMP Latest Ref Rng & Units 04/17/2020  Glucose 70 - 99 mg/dL 106(H)  BUN 8 - 23 mg/dL 13  Creatinine 0.44 - 1.00 mg/dL <0.30(L)  Sodium 135 - 145 mmol/L 141  Potassium 3.5 - 5.1 mmol/L 3.0(L)  Chloride 98 - 111 mmol/L 109  CO2 22 - 32 mmol/L 24  Calcium 8.9 - 10.3 mg/dL 8.7(L)  Total Protein 6.5 - 8.1 g/dL -  Total Bilirubin 0.3 - 1.2 mg/dL -  Alkaline Phos 38 - 126 U/L -  AST 15 - 41 U/L -  ALT 0 - 44 U/L -   CBC Latest Ref Rng & Units 04/17/2020  WBC 4.0 - 10.5 K/uL 10.8(H)  Hemoglobin 12.0 - 15.0 g/dL 7.3(L)  Hematocrit 36 - 46 % 23.2(L)  Platelets 150 - 400 K/uL 185    No images are attached to the encounter.  DG Chest 2 View  Result Date: 04/17/2020 CLINICAL DATA:  Chest pain and shortness of breath EXAM: CHEST - 2 VIEW COMPARISON:  April 10, 2020 FINDINGS: The heart size and mediastinal contours are within normal limits. Both lungs are clear. The visualized skeletal structures are unremarkable. IMPRESSION: No active cardiopulmonary disease. Electronically Signed   By: Abelardo Diesel M.D.   On: 04/17/2020 17:00   DG Chest 2 View  Result Date: 04/10/2020 CLINICAL DATA:  Cough, shortness of breath. EXAM: CHEST - 2 VIEW COMPARISON:  September 20, 2019. FINDINGS: The heart size and mediastinal contours are within normal limits. Both lungs are clear. No pneumothorax or pleural effusion is noted. The visualized skeletal structures are unremarkable. IMPRESSION: No active cardiopulmonary disease. Aortic Atherosclerosis (ICD10-I70.0). Electronically Signed   By: Marijo Conception M.D.   On: 04/10/2020 14:32    Assessment and plan- Patient is a 69 y.o. female diagnosed with multiple myeloma status post chemotherapy and bone marrow transplant in 2020 who presents to symptom management  clinic  1. Symptomatic anemia-Symptomatic. Hemoglobin 7.3  Yesterday. plan for 1 unit of irradiated PRBCs today. Discussed expected benefits of transfusion and disclosed risks of transfusion, including but not limited to, transmission of infection or disease, acute  or delayed hemolytic reactions, febrile reactions, allergic or anaphylactic reactions, transfusion associated lung injuries, circulatory overload, metabolic toxicity, and/or iron overload. Also, discussed alternatives to transfusion and use of any premedications. Patient verbalizes consent and wishes to proceed.  We will plan for premedication with Benadryl 25 mg and Tylenol 650 mg.   Anemia etiology unclear. Recent BM showed good control of MM, PET in August was negative, however, Revlimid has been held given acute issues. Repeat bone marrow planned for 9/28 however though given recent decline in hemoglobin. May require additional transfusion next week. Continue to monitor.   2. CHF- BNP 1495;  continue lasix 40 mg daily PO as prescribed. Monitor weights at home. Encouraged her to call cardiology to see them sooner than scheduled (06/04/20).   BP soft after blood transfusion. Held post transfusion IV lasix. Asked patient to continue home lasix 40 daily and continue to monitor blood pressures.   3. Hypokalemia- likely secondary to lasix. Encouraged compliance. Will send refill.   4. Bronchitis- diagnosed with pulmonary. Chest x-ray yesterday was negative. Continue prednisone and azithromycin as prescribed. Encouraged compliance with inhalers for asthma. Tussionex not covered by insurance. Will send script for Robitussin AC. Precautions discussed. Follow up with Dr. Lanney Gins as scheduled.   5. Goiter- impairing ability to swallow solid food. Patient has appt with Dr. Lucia Gaskins on 04/30/20 for further evaluation. Recommend treatment as comorbidities allow as symptoms interfering with nutrition.   Findings discussed with Dr. Rogue Bussing. He  contributed to and agreed with plan of care.   Return to clinic on Monday for labs and re-evaluation.    Visit Diagnosis 1. Symptomatic anemia   2. Hypokalemia   3. Acute bronchitis, unspecified organism   4. Goiter     Patient expressed understanding and was in agreement with this plan. She also understands that She can call clinic at any time with any questions, concerns, or complaints.   Thank you for allowing me to participate in the care of this very pleasant patient.   Beckey Rutter, DNP, AGNP-C [place] [facility] [place]

## 2020-04-18 NOTE — Progress Notes (Signed)
Blood transfusion started at 1025, in pre-blood vitals pt.'s temperature was 97 tympanic. At 15 minute post blood start, pt.'s temperature was at 98 tympanic. Pt is currently resting and has no complaints at this time. NP notified, per NP to continue with blood transfusion. Post transfusion pt.'s vitals were T-97.2, BP-103/63, HR-97. Pt has no complaints at this time. MD and NP notified, no new orders and pt may be discharged.   Brandi [name] [employer]

## 2020-04-19 LAB — TYPE AND SCREEN
ABO/RH(D): O POS
Antibody Screen: NEGATIVE
Unit division: 0

## 2020-04-19 LAB — BPAM RBC
Blood Product Expiration Date: 202110182359
ISSUE DATE / TIME: 202109241017
Unit Type and Rh: 5100

## 2020-04-21 ENCOUNTER — Other Ambulatory Visit: Payer: Self-pay | Admitting: Radiology

## 2020-04-21 ENCOUNTER — Other Ambulatory Visit: Payer: Self-pay

## 2020-04-21 ENCOUNTER — Inpatient Hospital Stay (HOSPITAL_BASED_OUTPATIENT_CLINIC_OR_DEPARTMENT_OTHER): Payer: Medicare Other | Admitting: Oncology

## 2020-04-21 ENCOUNTER — Inpatient Hospital Stay: Payer: Medicare Other

## 2020-04-21 VITALS — BP 107/77 | HR 53 | Temp 98.5°F | Resp 17

## 2020-04-21 DIAGNOSIS — C9000 Multiple myeloma not having achieved remission: Secondary | ICD-10-CM | POA: Diagnosis not present

## 2020-04-21 DIAGNOSIS — C9002 Multiple myeloma in relapse: Secondary | ICD-10-CM | POA: Diagnosis not present

## 2020-04-21 LAB — CBC WITH DIFFERENTIAL/PLATELET
Abs Immature Granulocytes: 0.06 10*3/uL (ref 0.00–0.07)
Basophils Absolute: 0 10*3/uL (ref 0.0–0.1)
Basophils Relative: 0 %
Eosinophils Absolute: 0 10*3/uL (ref 0.0–0.5)
Eosinophils Relative: 0 %
HCT: 29 % — ABNORMAL LOW (ref 36.0–46.0)
Hemoglobin: 9.1 g/dL — ABNORMAL LOW (ref 12.0–15.0)
Immature Granulocytes: 1 %
Lymphocytes Relative: 18 %
Lymphs Abs: 1.9 10*3/uL (ref 0.7–4.0)
MCH: 26.9 pg (ref 26.0–34.0)
MCHC: 31.4 g/dL (ref 30.0–36.0)
MCV: 85.8 fL (ref 80.0–100.0)
Monocytes Absolute: 0.5 10*3/uL (ref 0.1–1.0)
Monocytes Relative: 5 %
Neutro Abs: 8.4 10*3/uL — ABNORMAL HIGH (ref 1.7–7.7)
Neutrophils Relative %: 76 %
Platelets: 169 10*3/uL (ref 150–400)
RBC: 3.38 MIL/uL — ABNORMAL LOW (ref 3.87–5.11)
RDW: 15.4 % (ref 11.5–15.5)
WBC: 11 10*3/uL — ABNORMAL HIGH (ref 4.0–10.5)
nRBC: 0.4 % — ABNORMAL HIGH (ref 0.0–0.2)

## 2020-04-21 LAB — SAMPLE TO BLOOD BANK

## 2020-04-21 MED ORDER — SODIUM CHLORIDE 0.9 % IV SOLN: INTRAVENOUS | Status: DC

## 2020-04-21 NOTE — Progress Notes (Signed)
[facility] visit today to follow up on weakness, cough. Pt received 1 unit of blood on Friday. Pt feels better today. Denies dyspnea. Cough much better. Oxygen at [employer] is better. Legs always feel alittle weak. Appetite is back. Hemoglobin up to 9.1.

## 2020-04-21 NOTE — Progress Notes (Signed)
Symptom Management Consult note [facility]  [phone] [phone]  Patient Care Team: [facility], [place] as PCP - General (General Practice) End, Harrell Gave, MD as PCP - Cardiology (Cardiology) [name], [name] [name], MD as Consulting Physician (Hematology and Oncology) Ottie Glazier, MD as Consulting Physician (Pulmonary Disease)   Name of the patient: Brandi [name]  [id number]  June 12, 1951   Date of visit: 04/21/2020   Diagnosis-multiple myeloma  Chief complaint/ Reason for visit-follow-up  Heme/Onc history:  Oncology History Overview Note  # SEP 2018- MULTIPLE MYELOMA IgALamda [2.5 gm/dl; K/L= 88/1298]; STAGE III [beta 2 microglobulin=5.5] [presented with acute renal failure; anemia; NO hypercalcemia; Skeletal survey-Normal]; BMBx- 45% plasma cells; FISH-POSITIVE 11:14 translocation.[STANDARD-high RISK]/cyto-Normal; SEP 2018- PET- L3 posterior element lesion.   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 No morphologic support for residual myeloma disease. Negative for minimal residual disease by MM-MRD flow cytometry; HOLD REVLIMID [leg swelling]; AUG 2021-PET scan negative for myeloma; continue to hold Revlimid  # MARCH 2021-diastolic congestive heart failure [Dr.End]  --------------------------  # 12/12- RIGHT JUGULAR DVT-x 31mn xarelto; finished April  2020; September 2020-EGD/dysphagia; Dr. AnVicente Males# Acute renal failure [Dr.Singh; Proteinuria 1.5gm/day ]; acyclovir/Asprin ------------------------------------------------------------------------------------------------------------   DIAGNOSIS: '[ ]'  MULTIPLE MYELOMA  STAGE: III/HIGH RISK ;GOALS: CONTROL  CURRENT/MOST RECENT THERAPY-maintenance Revlimid    IgA myeloma (HCGrandfather 11/07/2017 -  Chemotherapy   The patient had palonosetron (ALOXI) injection 0.25 mg, 0.25 mg, Intravenous,  Once, 6 of 7 cycles Administration: 0.25 mg (11/21/2017), 0.25 mg (11/29/2017), 0.25 mg (12/05/2017), 0.25 mg (12/21/2017), 0.25 mg (12/28/2017), 0.25 mg (01/04/2018), 0.25 mg (01/18/2018), 0.25 mg (01/25/2018), 0.25 mg (02/01/2018), 0.25 mg (02/15/2018), 0.25 mg (02/22/2018), 0.25 mg (03/01/2018), 0.25 mg (03/15/2018), 0.25 mg (03/22/2018), 0.25 mg (03/30/2018), 0.25 mg (04/13/2018), 0.25 mg (04/20/2018), 0.25 mg (04/27/2018) cyclophosphamide (CYTOXAN) 500 mg in sodium chloride 0.9 % 250 mL chemo infusion, 540 mg, Intravenous,  Once, 6 of 7 cycles Administration: 500 mg (11/21/2017), 500 mg (11/29/2017), 500 mg (12/05/2017), 500 mg (12/28/2017), 500 mg (01/04/2018), 500 mg (01/18/2018), 500 mg (01/25/2018), 500 mg (02/01/2018), 500 mg (02/15/2018), 500 mg (02/22/2018), 500 mg (03/01/2018), 500 mg (03/15/2018), 500 mg (03/30/2018), 500 mg (04/13/2018), 500 mg (04/20/2018), 500 mg (04/27/2018) carfilzomib (KYPROLIS) 36 mg in dextrose 5 % 50 mL chemo infusion, 20 mg/m2 = 36 mg, Intravenous, Once, 6 of 7 cycles Administration: 36 mg (11/21/2017), 36 mg (11/22/2017), 60 mg (11/29/2017), 60 mg (11/30/2017), 60 mg (12/05/2017), 60 mg (12/06/2017), 60 mg (12/21/2017), 60 mg (12/22/2017), 60 mg (12/28/2017), 60 mg (12/29/2017), 60 mg (01/04/2018), 60 mg (01/05/2018), 60 mg (01/18/2018), 60 mg (01/19/2018), 60 mg (01/25/2018), 60 mg (01/27/2018), 60 mg (02/01/2018), 60 mg (02/02/2018), 60 mg (02/15/2018), 60 mg (02/16/2018), 60 mg (02/22/2018), 60 mg (03/01/2018), 60 mg (03/02/2018), 60 mg (03/15/2018), 60 mg  (03/16/2018), 60 mg (03/23/2018), 60 mg (03/30/2018), 60 mg (03/31/2018), 60 mg (04/13/2018), 60 mg (04/14/2018), 60 mg (04/20/2018), 60 mg (04/21/2018), 60 mg (04/27/2018)  for chemotherapy treatment.  Interval history-Brandi [name] is a 69 year old female with past medical history significant for hypertension, heart failure, acute renal failure, chronic hypokalemia and currently being treated by Dr. Rogue Bussing for multiple myeloma.  She is status post chemotherapy of bone marrow transplant back in 2020.  She is scheduled for repeat bone marrow tomorrow to reassess.  She was seen in clinic last week for cough, dysphagia and chest pain.  Work-up included labs (elevated BNP and anemia hemoglobin 7.3), negative chest x-ray and EKG-normal.  She was given IV Lasix and 1 unit packed red blood cells.  She was scheduled to follow-up with ENT for due to dysphagia/esophagitis and goiter.  Dr. Richardson Landry her ENT previously suggested radioactive iodine should symptoms worsen.    Today, she presents back for follow-up.  States she is feeling much better since receiving the 1 unit of packed red blood cells last week.  Her energy level is much better.  Appetite has improved.  Cough has improved.  Reports less shortness of breath.  Appetite is better.   ECOG FS:2 - Symptomatic, <50% confined to bed  Review of systems- Review of Systems  Constitutional: Negative.  Negative for chills, fever, malaise/fatigue and weight loss.  HENT: Negative for congestion, ear pain and tinnitus.   Eyes: Negative.  Negative for blurred vision and double vision.  Respiratory: Positive for shortness of breath. Negative for cough and sputum production.   Cardiovascular: Negative.  Negative for chest pain, palpitations and leg swelling.  Gastrointestinal: Negative.  Negative for abdominal pain, constipation, diarrhea, nausea and vomiting.  Genitourinary: Negative for dysuria, frequency and urgency.  Musculoskeletal: Negative for back pain and  falls.  Skin: Negative.  Negative for rash.  Neurological: Negative.  Negative for weakness and headaches.  Endo/Heme/Allergies: Negative.  Does not bruise/bleed easily.  Psychiatric/Behavioral: Negative.  Negative for depression. The patient is not nervous/anxious and does not have insomnia.      Current treatment-currently not on active treatment.  No Known Allergies   Past Medical History:  Diagnosis Date  . At risk for cardiac dysfunction   . Hypertension   . Hypokalemia   . IgA myeloma ([place])   . Multiple myeloma ([place])   . Red blood cell antibody positive, compatible PRBC difficult to obtain   . S/P autologous bone marrow transplantation [facility])      Past Surgical History:  Procedure Laterality Date  . ESOPHAGOGASTRODUODENOSCOPY (EGD) WITH PROPOFOL N/A 03/30/2019   Procedure: ESOPHAGOGASTRODUODENOSCOPY (EGD) WITH PROPOFOL;  Surgeon: Jonathon Bellows, MD;  Location[facility] ENDOSCOPY;  Service: Gastroenterology;  Laterality: N/A;  . IR FLUORO GUIDE PORT INSERTION RIGHT  12/16/2017    Social History   Socioeconomic History  . Marital status: Single    Spouse name: Not on file  . Number of children: Not on file  . Years of education: Not on file  . Highest education level: Not on file  Occupational History  . Not on file  Tobacco Use  . Smoking status: Former Smoker    Packs/day: 0.50    Types: Cigarettes  . Smokeless tobacco: Never Used  . Tobacco comment: 2 cigarettes QOD  Vaping Use  . Vaping Use: Never used  Substance and Sexual Activity  . Alcohol use: No  . Drug use: No  . Sexual activity: Not on file  Other Topics Concern  . Not on file  Social History Narrative   Lives at home with family. Independent at baseline   Social Determinants of Health   Financial Resource Strain:   .  Difficulty of Paying Living Expenses: Not on file  Food Insecurity:   . Worried About Charity fundraiser in the Last Year: Not on file  . Ran Out of Food in the Last Year: Not on  file  Transportation Needs:   . Lack of Transportation (Medical): Not on file  . Lack of Transportation (Non-Medical): Not on file  Physical Activity:   . Days of Exercise per Week: Not on file  . Minutes of Exercise per Session: Not on file  Stress:   . Feeling of Stress : Not on file  Social Connections:   . Frequency of Communication with Friends and Family: Not on file  . Frequency of Social Gatherings with Friends and Family: Not on file  . Attends Religious Services: Not on file  . Active Member of Clubs or Organizations: Not on file  . Attends Archivist Meetings: Not on file  . Marital Status: Not on file  Intimate Partner Violence:   . Fear of Current or Ex-Partner: Not on file  . Emotionally Abused: Not on file  . Physically Abused: Not on file  . Sexually Abused: Not on file    Family History  Problem Relation Age of Onset  . Pneumonia Mother   . Seizures Father      Current Outpatient Medications:  .  acyclovir (ZOVIRAX) 400 MG tablet, TAKE 1 TABLET BY MOUTH ONCE DAILY TO  PREVENT  SHINGLES, Disp: 30 tablet, Rfl: 0 .  albuterol (VENTOLIN HFA) 108 (90 Base) MCG/ACT inhaler, Inhale 2 puffs into the lungs every 6 (six) hours as needed for wheezing or shortness of breath., Disp: 8 g, Rfl: 3 .  amLODipine (NORVASC) 10 MG tablet, Take by mouth., Disp: , Rfl:  .  aspirin EC 81 MG EC tablet, Take 1 tablet (81 mg total) by mouth daily., Disp: 90 tablet, Rfl: 3 .  ergocalciferol (VITAMIN D2) 1.25 MG (50000 UT) capsule, Take 1 capsule (50,000 Units total) by mouth once a week., Disp: 12 capsule, Rfl: 2 .  feeding supplement, ENSURE ENLIVE, (ENSURE ENLIVE) LIQD, Take 237 mLs by mouth 2 (two) times daily between meals., Disp: 60 Bottle, Rfl: 0 .  Fluticasone-Salmeterol (ADVAIR DISKUS) 500-50 MCG/DOSE AEPB, Inhale 1 puff into the lungs 2 (two) times daily., Disp: 1 each, Rfl: 3 .  furosemide (LASIX) 40 MG tablet, Take 1 tablet (40 mg total) by mouth daily., Disp: 90  tablet, Rfl: 3 .  guaiFENesin-codeine (ROBITUSSIN AC) 100-10 MG/5ML syrup, Take 5 mLs by mouth 3 (three) times daily as needed for cough. May cause drowsiness. Do not drive with this medication., Disp: 120 mL, Rfl: 0 .  Ipratropium-Albuterol (COMBIVENT) 20-100 MCG/ACT AERS respimat, Inhale into the lungs., Disp: , Rfl:  .  ipratropium-albuterol (DUONEB) 0.5-2.5 (3) MG/3ML SOLN, Take 3 mLs by nebulization every 4 (four) hours as needed., Disp: 360 mL, Rfl: 3 .  megestrol (MEGACE ES) 625 MG/5ML suspension, Take 5 mLs (625 mg total) by mouth daily., Disp: 150 mL, Rfl: 0 .  montelukast (SINGULAIR) 10 MG tablet, Take 1 tablet (10 mg total) by mouth at bedtime., Disp: 30 tablet, Rfl: 3 .  pantoprazole (PROTONIX) 40 MG tablet, Take 1 tablet by mouth once daily, Disp: 90 tablet, Rfl: 0 .  polyethylene glycol (MIRALAX / GLYCOLAX) packet, Take 17 g by mouth daily as needed for mild constipation., Disp: 14 each, Rfl: 0 .  potassium chloride SA (KLOR-CON) 20 MEQ tablet, Take 1 tablet (20 mEq total) by mouth 2 (two) times  daily., Disp: 60 tablet, Rfl: 6 .  predniSONE (DELTASONE) 20 MG tablet, Take by mouth., Disp: , Rfl:  .  spironolactone (ALDACTONE) 25 MG tablet, Take 1 tablet (25 mg total) by mouth daily., Disp: 90 tablet, Rfl: 3 .  azithromycin (ZITHROMAX) 500 MG tablet, Take by mouth. (Patient not taking: Reported on 04/21/2020), Disp: , Rfl:  .  metoprolol succinate (TOPROL XL) 25 MG 24 hr tablet, Take 1.5 tablets (37.5 mg total) by mouth daily., Disp: 45 tablet, Rfl: 3 .  nicotine polacrilex (COMMIT) 2 MG lozenge, Place inside cheek. (Patient not taking: Reported on 04/21/2020), Disp: , Rfl:  No current facility-administered medications for this visit.  Facility-Administered Medications Ordered in Other Visits:  .  [START ON 04/22/2020] 0.9 %  sodium chloride infusion, , Intravenous, Continuous, Monia Sabal, PA-C .  sodium chloride flush (NS) 0.9 % injection 10 mL, 10 mL, Intracatheter, PRN, [name], MD, 10 mL at 02/23/18 1400  Physical exam:  Vitals:   04/21/20 1322  BP: 107/77  Pulse: (!) 53  Resp: 17  Temp: 98.5 F (36.9 C)  TempSrc: Tympanic  SpO2: 100%  PF: (!) 2 L/min   Physical Exam Constitutional:      Appearance: Normal appearance.  HENT:     Head: Normocephalic and atraumatic.  Eyes:     Pupils: Pupils are equal, round, and reactive to light.  Cardiovascular:     Rate and Rhythm: Normal rate and regular rhythm.     Heart sounds: Normal heart sounds. No murmur heard.   Pulmonary:     Effort: Pulmonary effort is normal.     Breath sounds: Normal breath sounds. No wheezing.  Abdominal:     General: Bowel sounds are normal. There is no distension.     Palpations: Abdomen is soft.     Tenderness: There is no abdominal tenderness.  Musculoskeletal:        General: Normal range of motion.     Cervical back: Normal range of motion.  Skin:    General: Skin is warm and dry.     Findings: No rash.  Neurological:     Mental Status: She is alert and oriented to person, place, and time.  Psychiatric:        Judgment: Judgment normal.      CMP Latest Ref Rng & Units 04/17/2020  Glucose 70 - 99 mg/dL 106(H)  BUN 8 - 23 mg/dL 13  Creatinine 0.44 - 1.00 mg/dL <0.30(L)  Sodium 135 - 145 mmol/L 141  Potassium 3.5 - 5.1 mmol/L 3.0(L)  Chloride 98 - 111 mmol/L 109  CO2 22 - 32 mmol/L 24  Calcium 8.9 - 10.3 mg/dL 8.7(L)  Total Protein 6.5 - 8.1 g/dL -  Total Bilirubin 0.3 - 1.2 mg/dL -  Alkaline Phos 38 - 126 U/L -  AST 15 - 41 U/L -  ALT 0 - 44 U/L -   CBC Latest Ref Rng & Units 04/21/2020  WBC 4.0 - 10.5 K/uL 11.0(H)  Hemoglobin 12.0 - 15.0 g/dL 9.1(L)  Hematocrit 36 - 46 % 29.0(L)  Platelets 150 - 400 K/uL 169    No images are attached to the encounter.  DG Chest 2 View  Result Date: 04/17/2020 CLINICAL DATA:  Chest pain and shortness of breath EXAM: CHEST - 2 VIEW COMPARISON:  April 10, 2020 FINDINGS: The heart size and mediastinal  contours are within normal limits. Both lungs are clear. The visualized skeletal structures are unremarkable. IMPRESSION: No active cardiopulmonary disease.  Electronically Signed   By: Abelardo Diesel M.D.   On: 04/17/2020 17:00   DG Chest 2 View  Result Date: 04/10/2020 CLINICAL DATA:  Cough, shortness of breath. EXAM: CHEST - 2 VIEW COMPARISON:  September 20, 2019. FINDINGS: The heart size and mediastinal contours are within normal limits. Both lungs are clear. No pneumothorax or pleural effusion is noted. The visualized skeletal structures are unremarkable. IMPRESSION: No active cardiopulmonary disease. Aortic Atherosclerosis (ICD10-I70.0). Electronically Signed   By: Marijo Conception M.D.   On: 04/10/2020 14:32     Assessment and plan- Patient is a 69 y.o. female who presents to [facility] today for follow-up.  Last week she was evaluated for cough, shortness of breath and weakness.  She was given a unit of packed red blood cells, IV Lasix and referred to ENT for dysphagia secondary to goiter.  Multiple myeloma-status post bone marrow transplant.  Currently not on treatment.  Has bone marrow biopsy scheduled for tomorrow to assess status.  Shortness of breath-improved since 1 unit packed red blood cells.  Cough-previously chest x-ray was negative.  Improved with cough syrup.  Anemia-unclear etiology.  No active bleed.  Has bone marrow tomorrow.  If negative, patient will likely be worked up with a GI referral with iron work-up.  Dysphagia-trouble swallowing secondary to goiter.  Managing.  Patient to see Dr. Richardson Landry in the near future.  Plan- Continue per plan of care. No new orders today. Labs stable.  Disposition- RTC for bone marrow tomorrow. RTC to see Dr. Rogue Bussing as scheduled.  Visit Diagnosis 1. IgA myeloma ([place])   2. Multiple myeloma in relapse [facility])     Patient expressed understanding and was in agreement with this plan. She also understands that She can call clinic at any time  with any questions, concerns, or complaints.   Greater than 50% was spent in counseling and coordination of care with this patient including but not limited to discussion of the relevant topics above (See A&P) including, but not limited to diagnosis and management of acute and chronic medical conditions.   Thank you for allowing me to participate in the care of this very pleasant patient.    Jacquelin Hawking, NP [place] [facility] Cell - [phone] Pager- [phone] 04/21/2020 2:11 PM

## 2020-04-22 ENCOUNTER — Other Ambulatory Visit: Payer: Self-pay

## 2020-04-22 ENCOUNTER — Ambulatory Visit
Admission: RE | Admit: 2020-04-22 | Discharge: 2020-04-22 | Disposition: A | Discharge status: Home / Self Care | Payer: Medicare Other | Source: Ambulatory Visit | Attending: Internal Medicine | Admitting: Internal Medicine

## 2020-04-22 DIAGNOSIS — Z7901 Long term (current) use of anticoagulants: Secondary | ICD-10-CM | POA: Diagnosis not present

## 2020-04-22 DIAGNOSIS — Z7982 Long term (current) use of aspirin: Secondary | ICD-10-CM | POA: Insufficient documentation

## 2020-04-22 DIAGNOSIS — I10 Essential (primary) hypertension: Secondary | ICD-10-CM | POA: Diagnosis not present

## 2020-04-22 DIAGNOSIS — R0600 Dyspnea, unspecified: Secondary | ICD-10-CM | POA: Insufficient documentation

## 2020-04-22 DIAGNOSIS — D649 Anemia, unspecified: Secondary | ICD-10-CM | POA: Insufficient documentation

## 2020-04-22 DIAGNOSIS — C9000 Multiple myeloma not having achieved remission: Secondary | ICD-10-CM | POA: Diagnosis not present

## 2020-04-22 DIAGNOSIS — Z79899 Other long term (current) drug therapy: Secondary | ICD-10-CM | POA: Insufficient documentation

## 2020-04-22 DIAGNOSIS — R05 Cough: Secondary | ICD-10-CM | POA: Insufficient documentation

## 2020-04-22 LAB — CBC WITH DIFFERENTIAL/PLATELET
Abs Immature Granulocytes: 0.26 10*3/uL — ABNORMAL HIGH (ref 0.00–0.07)
Basophils Absolute: 0 10*3/uL (ref 0.0–0.1)
Basophils Relative: 0 %
Eosinophils Absolute: 0.1 10*3/uL (ref 0.0–0.5)
Eosinophils Relative: 1 %
HCT: 29.2 % — ABNORMAL LOW (ref 36.0–46.0)
Hemoglobin: 9.4 g/dL — ABNORMAL LOW (ref 12.0–15.0)
Immature Granulocytes: 2 %
Lymphocytes Relative: 29 %
Lymphs Abs: 4.9 10*3/uL — ABNORMAL HIGH (ref 0.7–4.0)
MCH: 27.1 pg (ref 26.0–34.0)
MCHC: 32.2 g/dL (ref 30.0–36.0)
MCV: 84.1 fL (ref 80.0–100.0)
Monocytes Absolute: 1.5 10*3/uL — ABNORMAL HIGH (ref 0.1–1.0)
Monocytes Relative: 9 %
Neutro Abs: 9.9 10*3/uL — ABNORMAL HIGH (ref 1.7–7.7)
Neutrophils Relative %: 59 %
Platelets: 174 10*3/uL (ref 150–400)
RBC: 3.47 MIL/uL — ABNORMAL LOW (ref 3.87–5.11)
RDW: 15.6 % — ABNORMAL HIGH (ref 11.5–15.5)
Smear Review: NORMAL
WBC: 16.6 10*3/uL — ABNORMAL HIGH (ref 4.0–10.5)
nRBC: 0.7 % — ABNORMAL HIGH (ref 0.0–0.2)

## 2020-04-22 MED ORDER — FENTANYL CITRATE (PF) 100 MCG/2ML IJ SOLN
INTRAMUSCULAR | Status: AC | PRN
  Administered 2020-04-22 (×2): 50 ug via INTRAVENOUS

## 2020-04-22 MED ORDER — SODIUM CHLORIDE 0.9 % IV SOLN: INTRAVENOUS | Status: DC

## 2020-04-22 MED ORDER — MIDAZOLAM HCL 2 MG/2ML IJ SOLN
INTRAMUSCULAR | Status: AC | PRN
  Administered 2020-04-22: 1 mg via INTRAVENOUS

## 2020-04-22 MED ORDER — MIDAZOLAM HCL 2 MG/2ML IJ SOLN
INTRAMUSCULAR | Status: AC
  Filled 2020-04-22: qty 2

## 2020-04-22 MED ORDER — HEPARIN SOD (PORK) LOCK FLUSH 100 UNIT/ML IV SOLN
INTRAVENOUS | Status: AC
  Filled 2020-04-22: qty 5

## 2020-04-22 MED ORDER — HYDROCODONE-ACETAMINOPHEN 5-325 MG PO TABS
1.0000 | ORAL_TABLET | ORAL | Status: DC | PRN
  Filled 2020-04-22: qty 2

## 2020-04-22 MED ORDER — FENTANYL CITRATE (PF) 100 MCG/2ML IJ SOLN
INTRAMUSCULAR | Status: AC
  Filled 2020-04-22: qty 2

## 2020-04-22 NOTE — Discharge Instructions (Signed)
Bone Marrow Aspiration and Bone Marrow Biopsy, Adult, Care After °This sheet gives you information about how to care for yourself after your procedure. Your health care provider may also give you more specific instructions. If you have problems or questions, contact your health care provider. °What can I expect after the procedure? °After the procedure, it is common to have: °· Mild pain and tenderness. °· Swelling. °· Bruising. °Follow these instructions at home: °Puncture site care ° °· Follow instructions from your health care provider about how to take care of the puncture site. Make sure you: °? Wash your hands with soap and water before and after you change your bandage (dressing). If soap and water are not available, use hand sanitizer. °? Change your dressing as told by your health care provider. °· Check your puncture site every day for signs of infection. Check for: °? More redness, swelling, or pain. °? Fluid or blood. °? Warmth. °? Pus or a bad smell. °Activity °· Return to your normal activities as told by your health care provider. Ask your health care provider what activities are safe for you. °· Do not lift anything that is heavier than 10 lb (4.5 kg), or the limit that you are told, until your health care provider says that it is safe. °· Do not drive for 24 hours if you were given a sedative during your procedure. °General instructions ° °· Take over-the-counter and prescription medicines only as told by your health care provider. °· Do not take baths, swim, or use a hot tub until your health care provider approves. Ask your health care provider if you may take showers. You may only be allowed to take sponge baths. °· If directed, put ice on the affected area. To do this: °? Put ice in a plastic bag. °? Place a towel between your skin and the bag. °? Leave the ice on for 20 minutes, 2-3 times a day. °· Keep all follow-up visits as told by your health care provider. This is important. °Contact a  health care provider if: °· Your pain is not controlled with medicine. °· You have a fever. °· You have more redness, swelling, or pain around the puncture site. °· You have fluid or blood coming from the puncture site. °· Your puncture site feels warm to the touch. °· You have pus or a bad smell coming from the puncture site. °Summary °· After the procedure, it is common to have mild pain, tenderness, swelling, and bruising. °· Follow instructions from your health care provider about how to take care of the puncture site and what activities are safe for you. °· Take over-the-counter and prescription medicines only as told by your health care provider. °· Contact a health care provider if you have any signs of infection, such as fluid or blood coming from the puncture site. °This information is not intended to replace advice given to you by your health care provider. Make sure you discuss any questions you have with your health care provider. °Document Revised: 11/28/2018 Document Reviewed: 11/28/2018 °Elsevier Patient Education © 2020 [employer] ° °

## 2020-04-22 NOTE — Procedures (Signed)
Interventional Radiology Procedure Note  Procedure: CT guided aspirate and core biopsy of right iliac bone  Complications: None  EBL: <10 mL  Recommendations: - Bedrest supine x 1 hrs - Hydrocodone PRN  Pain - Follow biopsy results   Ruthann Cancer, MD

## 2020-04-22 NOTE — H&P (Signed)
Referring Physician(s): [name]  Supervising Physician: Suttle,D  Patient Status:  [facility] OP  Chief Complaint: "I'm having a bone marrow biopsy"  Subjective: Patient familiar to IR service from prior bone marrow biopsies in 2018, 2019 x2, as well as Port-A-Cath placement in 2019.  She has a history of multiple myeloma diagnosed in 2018 with prior stem cell transplant in 2019 at [facility]. She now has  anemia of uncertain etiology and presents again today for CT guided bone marrow biopsy for further evaluation.  She currently denies any fever, headache, chest pain, back pain, nausea, vomiting or bleeding.  She does have some dyspnea,  occasional cough and intermittent abd discomfort. Additional hx as below.   Past Medical History:  Diagnosis Date  . At risk for cardiac dysfunction   . Hypertension   . Hypokalemia   . IgA myeloma ([place])   . Multiple myeloma ([place])   . Red blood cell antibody positive, compatible PRBC difficult to obtain   . S/P autologous bone marrow transplantation [facility])    Past Surgical History:  Procedure Laterality Date  . ESOPHAGOGASTRODUODENOSCOPY (EGD) WITH PROPOFOL N/A 03/30/2019   Procedure: ESOPHAGOGASTRODUODENOSCOPY (EGD) WITH PROPOFOL;  Surgeon: Jonathon Bellows, MD;  Location: [facility] ENDOSCOPY;  Service: Gastroenterology;  Laterality: N/A;  . IR FLUORO GUIDE PORT INSERTION RIGHT  12/16/2017      Allergies: Patient has no known allergies.  Medications: Prior to Admission medications   Medication Sig Start Date End Date Taking? Authorizing Provider  acyclovir (ZOVIRAX) 400 MG tablet TAKE 1 TABLET BY MOUTH ONCE DAILY TO  PREVENT  SHINGLES 03/07/20  Yes [name], MD  albuterol (VENTOLIN HFA) 108 (90 Base) MCG/ACT inhaler Inhale 2 puffs into the lungs every 6 (six) hours as needed for wheezing or shortness of breath. 03/05/20  Yes [name], MD  amLODipine (NORVASC) 10 MG tablet Take by mouth.   Yes [provider]  aspirin EC 81 MG EC tablet Take 1 tablet (81 mg total) by mouth daily. 01/25/20  Yes Loel Dubonnet, NP  azithromycin (ZITHROMAX) 500 MG tablet Take by mouth.  04/16/20 04/23/20 Yes [provider]  ergocalciferol (VITAMIN D2) 1.25 MG (50000 UT) capsule Take 1 capsule (50,000 Units total) by mouth once a week. 11/29/19  Yes [name], MD  feeding supplement, ENSURE ENLIVE, (ENSURE ENLIVE) LIQD Take 237 mLs by mouth 2 (two) times daily between meals. 04/12/17  Yes Gouru, Aruna, MD  Fluticasone-Salmeterol (ADVAIR DISKUS) 500-50 MCG/DOSE AEPB Inhale 1 puff into the lungs 2 (two) times daily. 10/02/19  Yes [name], MD  furosemide (LASIX) 40 MG tablet Take 1 tablet (40 mg total) by mouth daily. 11/12/19  Yes Loel Dubonnet, NP  guaiFENesin-codeine (ROBITUSSIN AC) 100-10 MG/5ML syrup Take 5 mLs by mouth 3 (three) times daily as needed for cough. May cause drowsiness. Do not drive with this medication. 04/18/20  Yes Verlon Au, NP  Ipratropium-Albuterol (COMBIVENT) 20-100 MCG/ACT AERS respimat Inhale into the lungs. 03/13/20 03/13/21 Yes [provider]  ipratropium-albuterol (DUONEB) 0.5-2.5 (3) MG/3ML SOLN Take 3 mLs by nebulization every 4 (four) hours as needed. 01/02/20  Yes [name], MD  megestrol (MEGACE ES) 625 MG/5ML suspension Take 5 mLs (625 mg total) by mouth daily. 04/16/20  Yes [name], MD  metoprolol succinate (TOPROL XL) 25 MG 24 hr tablet Take 1.5 tablets (37.5 mg total) by mouth daily. 02/13/20  Yes Loel Dubonnet, NP  montelukast (SINGULAIR)  10 MG tablet Take 1 tablet (10 mg total) by mouth at bedtime. 04/16/20  Yes [name], MD  nicotine polacrilex (COMMIT) 2 MG lozenge Place inside cheek.  03/12/20 06/10/20 Yes [provider]  pantoprazole (PROTONIX) 40 MG tablet Take 1 tablet by mouth once daily 03/07/20  Yes [name], Elisha Headland, MD  polyethylene glycol (MIRALAX / GLYCOLAX) packet  Take 17 g by mouth daily as needed for mild constipation. 04/11/17  Yes Gouru, Illene Silver, MD  potassium chloride SA (KLOR-CON) 20 MEQ tablet Take 1 tablet (20 mEq total) by mouth 2 (two) times daily. 04/18/20  Yes Verlon Au, NP  predniSONE (DELTASONE) 20 MG tablet Take by mouth. 04/16/20  Yes [provider]  spironolactone (ALDACTONE) 25 MG tablet Take 1 tablet (25 mg total) by mouth daily. 01/25/20 04/24/20 Yes Loel Dubonnet, NP     Vital Signs: BP 135/68   Pulse 91   Temp 98.4 F (36.9 C) (Oral)   Resp 20   Ht _0  (1.575 m)   Wt 117 lb (53.1 kg)   SpO2 100%   BMI 21.40 kg/m   Physical Exam awake, alert.  Chest clear to auscultation bilaterally.  Heart with regular rate and rhythm.  Abdomen soft, positive bowel sounds, some mild diffuse tenderness to palpation.  Bilateral pretibial edema noted.  Imaging: No results found.  Labs:  CBC: Recent Labs    04/10/20 1358 04/16/20 1014 04/17/20 1352 04/21/20 1253  WBC 3.7* 12.3* 10.8* 11.0*  HGB 7.9* 7.5* 7.3* 9.1*  HCT 25.6* 23.6* 23.2* 29.0*  PLT 138* 169 185 169    COAGS: No results for input(s): INR, APTT in the last 8760 hours.  BMP: Recent Labs    03/06/20 1118 04/10/20 1358 04/16/20 1014 04/17/20 1352  NA 141 137 143 141  K 3.0* 3.5 3.1* 3.0*  CL 110 105 109 109  CO2 21* _1 GLUCOSE 111* 95 103* 106*  BUN _2 CALCIUM 9.0 8.5* 8.7* 8.7*  CREATININE 0.38* 0.37* <0.30* <0.30*  GFRNONAA >60 >60 NOT CALCULATED NOT CALCULATED  GFRAA >60 >60 NOT CALCULATED NOT CALCULATED    LIVER FUNCTION TESTS: Recent Labs    12/11/19 1512 01/02/20 1411 03/06/20 1118 04/16/20 1014  BILITOT 0.8 0.6 0.8 0.9  AST 23 21 33 37  ALT 21 26 32 39  ALKPHOS 229* 123 181* 272*  PROT 6.8 7.0 6.9 6.9  ALBUMIN 3.4* 3.7 3.6 3.4*    Assessment and Plan: Pt with history of multiple myeloma diagnosed in 2018 with prior stem cell transplant in 2019 at [facility]. She now has  anemia of uncertain  etiology and presents  today for CT guided bone marrow biopsy for further evaluation. Risks and benefits of procedure was discussed with the patient  including, but not limited to bleeding, infection, damage to adjacent structures or low yield requiring additional tests.  All of the questions were answered and there is agreement to proceed.  Consent signed and in chart.     Electronically Signed: D. Rowe Robert, PA-C 04/22/2020, 8:17 AM   I spent a total of 20 minutes at the the patient's bedside AND on the patient's hospital floor or unit, greater than 50% of which was counseling/coordinating care for CT-guided bone marrow biopsy

## 2020-04-22 NOTE — Progress Notes (Signed)
Patient clinically stable post BMB per Dr Serafina Royals, tolerated well. Denies complaints at this time. Vitals stable throughout. Received Versed 1 mg along with Fentanyl 100 mcg IV for procedure. Report given to Carlynn Spry Rn post procedure in specials.

## 2020-04-24 ENCOUNTER — Inpatient Hospital Stay: Payer: Medicare Other

## 2020-04-24 ENCOUNTER — Other Ambulatory Visit: Payer: Self-pay

## 2020-04-24 DIAGNOSIS — C9000 Multiple myeloma not having achieved remission: Secondary | ICD-10-CM

## 2020-04-24 LAB — CBC WITH DIFFERENTIAL/PLATELET
Abs Immature Granulocytes: 0.08 10*3/uL — ABNORMAL HIGH (ref 0.00–0.07)
Basophils Absolute: 0 10*3/uL (ref 0.0–0.1)
Basophils Relative: 0 %
Eosinophils Absolute: 0.2 10*3/uL (ref 0.0–0.5)
Eosinophils Relative: 1 %
HCT: 28.4 % — ABNORMAL LOW (ref 36.0–46.0)
Hemoglobin: 9.2 g/dL — ABNORMAL LOW (ref 12.0–15.0)
Immature Granulocytes: 1 %
Lymphocytes Relative: 32 %
Lymphs Abs: 4.2 10*3/uL — ABNORMAL HIGH (ref 0.7–4.0)
MCH: 26.9 pg (ref 26.0–34.0)
MCHC: 32.4 g/dL (ref 30.0–36.0)
MCV: 83 fL (ref 80.0–100.0)
Monocytes Absolute: 0.8 10*3/uL (ref 0.1–1.0)
Monocytes Relative: 6 %
Neutro Abs: 7.8 10*3/uL — ABNORMAL HIGH (ref 1.7–7.7)
Neutrophils Relative %: 60 %
Platelets: 151 10*3/uL (ref 150–400)
RBC: 3.42 MIL/uL — ABNORMAL LOW (ref 3.87–5.11)
RDW: 15.5 % (ref 11.5–15.5)
Smear Review: NORMAL
WBC: 13.1 10*3/uL — ABNORMAL HIGH (ref 4.0–10.5)
nRBC: 0.6 % — ABNORMAL HIGH (ref 0.0–0.2)

## 2020-04-24 LAB — SAMPLE TO BLOOD BANK

## 2020-04-24 NOTE — Progress Notes (Unsigned)
Labs reviewed with MD and treatment team. Per MD pt does not need a blood transfusion today and may be discharged. Pt updated and all questions answered at this time.   Brandi [name] [employer]

## 2020-04-29 LAB — SURGICAL PATHOLOGY

## 2020-04-30 ENCOUNTER — Encounter (INDEPENDENT_AMBULATORY_CARE_PROVIDER_SITE_OTHER): Payer: Self-pay | Admitting: Otolaryngology

## 2020-04-30 ENCOUNTER — Other Ambulatory Visit: Payer: Self-pay

## 2020-04-30 ENCOUNTER — Ambulatory Visit (INDEPENDENT_AMBULATORY_CARE_PROVIDER_SITE_OTHER): Payer: Medicare Other | Admitting: Otolaryngology

## 2020-04-30 VITALS — Temp 96.6°F

## 2020-04-30 DIAGNOSIS — R1314 Dysphagia, pharyngoesophageal phase: Secondary | ICD-10-CM

## 2020-04-30 DIAGNOSIS — E049 Nontoxic goiter, unspecified: Secondary | ICD-10-CM | POA: Diagnosis not present

## 2020-04-30 NOTE — Progress Notes (Signed)
HPI: Brandi [name] is a 69 y.o. female who presents is referred by the cancer center for evaluation of dysphagia.  Patient has a history of multiple myeloma.  She has been noticing more trouble swallowing recently.  Of note she had previous dilation with a gastroenterologist performed several years ago.  She has not had a recent barium swallow but is presently scheduled to get a barium swallow.  On recent PET scan she was noted to have a large goiter. She denies sore throat. She has history of pulmonary disease and is on home O2..  Past Medical History:  Diagnosis Date  . At risk for cardiac dysfunction   . Hypertension   . Hypokalemia   . IgA myeloma ([place])   . Multiple myeloma ([place])   . Red blood cell antibody positive, compatible PRBC difficult to obtain   . S/P autologous bone marrow transplantation [facility])    Past Surgical History:  Procedure Laterality Date  . ESOPHAGOGASTRODUODENOSCOPY (EGD) WITH PROPOFOL N/A 03/30/2019   Procedure: ESOPHAGOGASTRODUODENOSCOPY (EGD) WITH PROPOFOL;  Surgeon: Jonathon Bellows, MD;  Location[facility] ENDOSCOPY;  Service: Gastroenterology;  Laterality: N/A;  . IR FLUORO GUIDE PORT INSERTION RIGHT  12/16/2017   Social History   Socioeconomic History  . Marital status: Single    Spouse name: Not on file  . Number of children: Not on file  . Years of education: Not on file  . Highest education level: Not on file  Occupational History  . Not on file  Tobacco Use  . Smoking status: Current Every Day Smoker    Packs/day: 0.50    Types: Cigarettes  . Smokeless tobacco: Never Used  . Tobacco comment: 2 cigarettes QOD  Vaping Use  . Vaping Use: Never used  Substance and Sexual Activity  . Alcohol use: No  . Drug use: No  . Sexual activity: Not on file  Other Topics Concern  . Not on file  Social History Narrative   Lives at home with family. Independent at baseline   Social Determinants of Health   Financial Resource Strain:   . Difficulty of Paying  Living Expenses: Not on file  Food Insecurity:   . Worried About Charity fundraiser in the Last Year: Not on file  . Ran Out of Food in the Last Year: Not on file  Transportation Needs:   . Lack of Transportation (Medical): Not on file  . Lack of Transportation (Non-Medical): Not on file  Physical Activity:   . Days of Exercise per Week: Not on file  . Minutes of Exercise per Session: Not on file  Stress:   . Feeling of Stress : Not on file  Social Connections:   . Frequency of Communication with Friends and Family: Not on file  . Frequency of Social Gatherings with Friends and Family: Not on file  . Attends Religious Services: Not on file  . Active Member of Clubs or Organizations: Not on file  . Attends Archivist Meetings: Not on file  . Marital Status: Not on file   Family History  Problem Relation Age of Onset  . Pneumonia Mother   . Seizures Father    No Known Allergies Prior to Admission medications   Medication Sig Start Date End Date Taking? Authorizing Provider  acyclovir (ZOVIRAX) 400 MG tablet TAKE 1 TABLET BY MOUTH ONCE DAILY TO  PREVENT  SHINGLES 03/07/20  Yes [name], MD  albuterol (VENTOLIN HFA) 108 (90 Base) MCG/ACT inhaler Inhale 2 puffs  into the lungs every 6 (six) hours as needed for wheezing or shortness of breath. 03/05/20  Yes [name], MD  amLODipine (NORVASC) 10 MG tablet Take by mouth.   Yes [provider]  aspirin EC 81 MG EC tablet Take 1 tablet (81 mg total) by mouth daily. 01/25/20  Yes Loel Dubonnet, NP  ergocalciferol (VITAMIN D2) 1.25 MG (50000 UT) capsule Take 1 capsule (50,000 Units total) by mouth once a week. 11/29/19  Yes [name], MD  feeding supplement, ENSURE ENLIVE, (ENSURE ENLIVE) LIQD Take 237 mLs by mouth 2 (two) times daily between meals. 04/12/17  Yes Gouru, Aruna, MD  Fluticasone-Salmeterol (ADVAIR DISKUS) 500-50 MCG/DOSE AEPB Inhale 1 puff into the lungs 2 (two) times daily.  10/02/19  Yes [name], MD  furosemide (LASIX) 40 MG tablet Take 1 tablet (40 mg total) by mouth daily. 11/12/19  Yes Loel Dubonnet, NP  guaiFENesin-codeine (ROBITUSSIN AC) 100-10 MG/5ML syrup Take 5 mLs by mouth 3 (three) times daily as needed for cough. May cause drowsiness. Do not drive with this medication. 04/18/20  Yes Verlon Au, NP  Ipratropium-Albuterol (COMBIVENT) 20-100 MCG/ACT AERS respimat Inhale into the lungs. 03/13/20 03/13/21 Yes [provider]  ipratropium-albuterol (DUONEB) 0.5-2.5 (3) MG/3ML SOLN Take 3 mLs by nebulization every 4 (four) hours as needed. 01/02/20  Yes [name], MD  megestrol (MEGACE ES) 625 MG/5ML suspension Take 5 mLs (625 mg total) by mouth daily. 04/16/20  Yes [name], MD  metoprolol succinate (TOPROL XL) 25 MG 24 hr tablet Take 1.5 tablets (37.5 mg total) by mouth daily. 02/13/20  Yes Loel Dubonnet, NP  montelukast (SINGULAIR) 10 MG tablet Take 1 tablet (10 mg total) by mouth at bedtime. 04/16/20  Yes [name], MD  nicotine polacrilex (COMMIT) 2 MG lozenge Place inside cheek.  03/12/20 06/10/20 Yes [provider]  pantoprazole (PROTONIX) 40 MG tablet Take 1 tablet by mouth once daily 03/07/20  Yes [name], Elisha Headland, MD  polyethylene glycol (MIRALAX / GLYCOLAX) packet Take 17 g by mouth daily as needed for mild constipation. 04/11/17  Yes Gouru, Illene Silver, MD  potassium chloride SA (KLOR-CON) 20 MEQ tablet Take 1 tablet (20 mEq total) by mouth 2 (two) times daily. 04/18/20  Yes Verlon Au, NP  predniSONE (DELTASONE) 20 MG tablet Take by mouth. 04/16/20  Yes [provider]  spironolactone (ALDACTONE) 25 MG tablet Take 1 tablet (25 mg total) by mouth daily. 01/25/20 04/24/20  Loel Dubonnet, NP     Positive ROS: Otherwise negative  All other systems have been reviewed and were otherwise negative with the exception of those mentioned in the HPI and as above.  Physical  Exam: Constitutional: Alert, well-appearing, female with a little bit of anxiety. Ears: External ears without lesions or tenderness. Ear canals are clear bilaterally with intact, clear TMs.  Nasal: External nose without lesions. Septum midline with mild rhinitis.. Clear nasal passages otherwise. Oral: Lips and gums without lesions. Tongue and palate mucosa without lesions. Posterior oropharynx clear. Fiberoptic laryngoscopy was passed through the right nasal cavity without difficulty.  The nasopharynx was clear.  The base of tongue vallecula epiglottis were normal.  Vocal cords were clear bilaterally with normal vocal cord mobility.  Both piriform sinuses were clear.  I was able to pass the laryngoscope through the upper esophageal sphincter without difficulty and the upper cervical esophagus was clear. Neck: Patient with a large goiter which extends a little bit  more on the right side than the left but this is soft to palpation.  No significant adenopathy noted otherwise Respiratory: Breathing comfortably  Skin: No facial/neck lesions or rash noted.  Laryngoscopy  Date/Time: 04/30/2020 2:48 PM Performed by: Rozetta Nunnery, MD Authorized by: Rozetta Nunnery, MD   Consent:    Consent obtained:  Verbal   Consent given by:  Patient Procedure details:    Indications: hoarseness, dysphagia, or aspiration     Medication:  Afrin   Instrument: flexible fiberoptic laryngoscope     Scope location: right nare   Sinus:    Right nasopharynx: normal   Mouth:    Oropharynx: normal     Vallecula: normal     Base of tongue: normal     Epiglottis: normal   Throat:    Pyriform sinus: normal     True vocal cords: normal   Comments:     On fiberoptic laryngoscopy the hypopharynx and larynx was clear with no structural abnormalities noted.  Patient with normal vocal cord mobility bilaterally.    Assessment: Dysphagia Large goiter  Plan: Would recommend proceeding with a barium  swallow and further therapy with speech therapy concerning assistance in swallowing. Pending results of barium swallow she might benefit from having dilation with gastroenterology as she has had in the past. She is not a candidate for surgery to have the goiter removed.   Radene Journey, MD   CC:

## 2020-05-01 ENCOUNTER — Telehealth (INDEPENDENT_AMBULATORY_CARE_PROVIDER_SITE_OTHER): Payer: Self-pay

## 2020-05-05 ENCOUNTER — Telehealth: Payer: Self-pay

## 2020-05-05 NOTE — Telephone Encounter (Signed)
Nutrition  Called patient for nutrition follow-up.  No answer.  Left message with contact information.    Jonica Bickhart B. Zenia Resides, [place], [place] Registered Dietitian [phone] (mobile)

## 2020-05-06 ENCOUNTER — Other Ambulatory Visit: Payer: Self-pay

## 2020-05-06 DIAGNOSIS — C9002 Multiple myeloma in relapse: Secondary | ICD-10-CM

## 2020-05-07 ENCOUNTER — Encounter: Payer: Self-pay | Admitting: Internal Medicine

## 2020-05-07 ENCOUNTER — Other Ambulatory Visit: Payer: Self-pay

## 2020-05-07 ENCOUNTER — Inpatient Hospital Stay (HOSPITAL_BASED_OUTPATIENT_CLINIC_OR_DEPARTMENT_OTHER): Payer: Medicare Other | Admitting: Internal Medicine

## 2020-05-07 ENCOUNTER — Inpatient Hospital Stay: Payer: Medicare Other | Attending: Internal Medicine

## 2020-05-07 VITALS — BP 134/68 | HR 91 | Temp 98.9°F | Resp 18 | Ht 62.0 in | Wt 123.0 lb

## 2020-05-07 DIAGNOSIS — C9000 Multiple myeloma not having achieved remission: Secondary | ICD-10-CM | POA: Diagnosis not present

## 2020-05-07 DIAGNOSIS — F17210 Nicotine dependence, cigarettes, uncomplicated: Secondary | ICD-10-CM | POA: Diagnosis not present

## 2020-05-07 DIAGNOSIS — C9002 Multiple myeloma in relapse: Secondary | ICD-10-CM

## 2020-05-07 DIAGNOSIS — Z79899 Other long term (current) drug therapy: Secondary | ICD-10-CM | POA: Diagnosis not present

## 2020-05-07 DIAGNOSIS — D649 Anemia, unspecified: Secondary | ICD-10-CM | POA: Insufficient documentation

## 2020-05-07 DIAGNOSIS — I110 Hypertensive heart disease with heart failure: Secondary | ICD-10-CM | POA: Insufficient documentation

## 2020-05-07 LAB — COMPREHENSIVE METABOLIC PANEL
ALT: 24 U/L (ref 0–44)
AST: 26 U/L (ref 15–41)
Albumin: 3.7 g/dL (ref 3.5–5.0)
Alkaline Phosphatase: 124 U/L (ref 38–126)
Anion gap: 8 (ref 5–15)
BUN: 32 mg/dL — ABNORMAL HIGH (ref 8–23)
CO2: 21 mmol/L — ABNORMAL LOW (ref 22–32)
Calcium: 9.3 mg/dL (ref 8.9–10.3)
Chloride: 108 mmol/L (ref 98–111)
Creatinine, Ser: 0.58 mg/dL (ref 0.44–1.00)
GFR, Estimated: >60 mL/min (ref >60)
Glucose, Bld: 98 mg/dL (ref 70–99)
Potassium: 4.4 mmol/L (ref 3.5–5.1)
Sodium: 137 mmol/L (ref 135–145)
Total Bilirubin: 0.8 mg/dL (ref 0.3–1.2)
Total Protein: 6.8 g/dL (ref 6.5–8.1)

## 2020-05-07 LAB — CBC WITH DIFFERENTIAL/PLATELET
Abs Immature Granulocytes: 0.04 10*3/uL (ref 0.00–0.07)
Basophils Absolute: 0 10*3/uL (ref 0.0–0.1)
Basophils Relative: 0 %
Eosinophils Absolute: 0.2 10*3/uL (ref 0.0–0.5)
Eosinophils Relative: 2 %
HCT: 31.1 % — ABNORMAL LOW (ref 36.0–46.0)
Hemoglobin: 9.8 g/dL — ABNORMAL LOW (ref 12.0–15.0)
Immature Granulocytes: 0 %
Lymphocytes Relative: 31 %
Lymphs Abs: 2.9 10*3/uL (ref 0.7–4.0)
MCH: 27.3 pg (ref 26.0–34.0)
MCHC: 31.5 g/dL (ref 30.0–36.0)
MCV: 86.6 fL (ref 80.0–100.0)
Monocytes Absolute: 1.1 10*3/uL — ABNORMAL HIGH (ref 0.1–1.0)
Monocytes Relative: 12 %
Neutro Abs: 5.1 10*3/uL (ref 1.7–7.7)
Neutrophils Relative %: 55 %
Platelets: 125 10*3/uL — ABNORMAL LOW (ref 150–400)
RBC: 3.59 MIL/uL — ABNORMAL LOW (ref 3.87–5.11)
RDW: 15.3 % (ref 11.5–15.5)
WBC: 9.4 10*3/uL (ref 4.0–10.5)
nRBC: 0.2 % (ref 0.0–0.2)

## 2020-05-07 LAB — SAMPLE TO BLOOD BANK

## 2020-05-07 NOTE — Progress Notes (Signed)
[place] OFFICE PROGRESS NOTE  Patient Care Team: [facility], [place] as PCP - General (General Practice) End, Harrell Gave, MD as PCP - Cardiology (Cardiology) [name], [name] [name], MD as Consulting Physician (Hematology and Oncology) Ottie Glazier, MD as Consulting Physician (Pulmonary Disease)  Cancer Staging No matching staging information was found for the patient.   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 No morphologic support for residual myeloma disease. Negative for minimal residual disease by MM-MRD flow cytometry; HOLD REVLIMID [leg swelling]; AUG 2021-PET scan negative for myeloma; continue to hold Revlimid  # MARCH 2021-diastolic congestive heart failure [Dr.End]  # BMBx- OCT 2021- BMBx- 5% plasmacytosis-however this appears to be more polyclonal rather than monoclonal-not explain patient worsening anemia.  --------------------------  # 12/12- RIGHT JUGULAR DVT-x 39mn xarelto; finished April 2020; September 2020-EGD/dysphagia;  Dr. 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 -  Chemotherapy   The patient had dexamethasone (DECADRON) 4 MG tablet, 1 of 1 cycle, Start date: --, End date: -- palonosetron (ALOXI) injection 0.25 mg, 0.25 mg, Intravenous,  Once, 6 of 7 cycles Administration: 0.25 mg (11/21/2017), 0.25 mg (11/29/2017), 0.25 mg (12/05/2017), 0.25 mg (12/21/2017), 0.25 mg (12/28/2017), 0.25 mg (01/04/2018), 0.25 mg (01/18/2018), 0.25 mg (01/25/2018), 0.25 mg (02/01/2018), 0.25 mg (02/15/2018), 0.25 mg (02/22/2018), 0.25 mg (03/01/2018), 0.25 mg (03/15/2018), 0.25 mg (03/22/2018), 0.25 mg (03/30/2018), 0.25 mg (04/13/2018), 0.25 mg (04/20/2018), 0.25 mg (04/27/2018) cyclophosphamide (CYTOXAN) 500 mg in sodium chloride 0.9 % 250 mL chemo infusion, 540 mg, Intravenous,  Once, 6 of 7 cycles Administration: 500 mg (11/21/2017), 500 mg (11/29/2017), 500 mg (12/05/2017), 500 mg (12/28/2017), 500 mg (01/04/2018), 500 mg (01/18/2018), 500 mg (01/25/2018), 500 mg (02/01/2018), 500 mg (02/15/2018), 500 mg (02/22/2018), 500 mg (03/01/2018), 500 mg (03/15/2018), 500 mg (03/30/2018), 500 mg (04/13/2018), 500 mg (04/20/2018), 500 mg (04/27/2018) carfilzomib (KYPROLIS) 36 mg in dextrose 5 % 50 mL chemo infusion, 20 mg/m2 = 36 mg, Intravenous, Once, 6 of 7 cycles Administration: 36 mg (11/21/2017), 36 mg (11/22/2017), 60 mg (11/29/2017), 60 mg (11/30/2017), 60 mg (12/05/2017), 60 mg (12/06/2017), 60 mg (12/21/2017), 60 mg (12/22/2017), 60 mg (12/28/2017), 60 mg (12/29/2017), 60 mg (01/04/2018), 60 mg (01/05/2018), 60 mg (01/18/2018), 60 mg (01/19/2018), 60 mg (01/25/2018), 60 mg (01/27/2018), 60 mg (02/01/2018), 60 mg (02/02/2018), 60 mg (02/15/2018), 60 mg (02/16/2018), 60 mg (02/22/2018), 60 mg  (03/01/2018), 60 mg (03/02/2018), 60 mg (03/15/2018), 60 mg (03/16/2018), 60 mg (03/23/2018), 60 mg (03/30/2018), 60 mg (03/31/2018), 60 mg (04/13/2018), 60 mg (04/14/2018), 60 mg (04/20/2018), 60 mg (04/21/2018),  60 mg (04/27/2018)  for chemotherapy treatment.     INTERVAL HISTORY:  Brandi [name] 69 y.o.  female pleasant patient above history of relapsed multiple myeloma--most recently not on maintenance Revlimid [on since Feb 2021] is here for follow-up/is here today with results of the bone marrow biopsy.  In the interim patient received 1 unit of PRBC transfusion for hemoglobin of 7.5 approximately 2 to 3 weeks ago.  Patient denies any blood in stools or black or stools.  Continues to have shortness of breath especially on exertion.  Chronic cough.  Patient is on inhalers because of asthma.  Continues to complain of difficulty sleeping at night.  Complains of poor appetite.  Positive for weight loss.  Positive for swelling in the legs.  Review of Systems  Constitutional: Positive for malaise/fatigue and weight loss. Negative for chills, diaphoresis and fever.  HENT: Negative for nosebleeds and sore throat.   Eyes: Negative for double vision.  Respiratory: Positive for cough, sputum production and shortness of breath. Negative for hemoptysis.   Cardiovascular: Positive for leg swelling. Negative for chest pain, palpitations and orthopnea.  Gastrointestinal: Negative for abdominal pain, blood in stool, constipation, heartburn and melena.  Genitourinary: Negative for dysuria, frequency and urgency.  Musculoskeletal: Positive for back pain and myalgias.  Skin: Negative.  Negative for itching and rash.  Neurological: Negative for dizziness, tingling, focal weakness, weakness and headaches.  Endo/Heme/Allergies: Does not bruise/bleed easily.  Psychiatric/Behavioral: Negative for depression. The patient has insomnia. The patient is not nervous/anxious.     PAST MEDICAL HISTORY :  Past Medical History:   Diagnosis Date  . At risk for cardiac dysfunction   . Hypertension   . Hypokalemia   . IgA myeloma ([place])   . Multiple myeloma ([place])   . Red blood cell antibody positive, compatible PRBC difficult to obtain   . S/P autologous bone marrow transplantation ([place])     PAST SURGICAL HISTORY :   Past Surgical History:  Procedure Laterality Date  . ESOPHAGOGASTRODUODENOSCOPY (EGD) WITH PROPOFOL N/A 03/30/2019   Procedure: ESOPHAGOGASTRODUODENOSCOPY (EGD) WITH PROPOFOL;  Surgeon: Jonathon Bellows, MD;  Location: [facility] ENDOSCOPY;  Service: Gastroenterology;  Laterality: N/A;  . IR FLUORO GUIDE PORT INSERTION RIGHT  12/16/2017    FAMILY HISTORY :   Family History  Problem Relation Age of Onset  . Pneumonia Mother   . Seizures Father     SOCIAL HISTORY:   Social History   Tobacco Use  . Smoking status: Current Every Day Smoker    Packs/day: 0.50    Types: Cigarettes  . Smokeless tobacco: Never Used  . Tobacco comment: 2 cigarettes QOD  Vaping Use  . Vaping Use: Never used  Substance Use Topics  . Alcohol use: No  . Drug use: No    ALLERGIES:  has No Known Allergies.  MEDICATIONS:  Current Outpatient Medications  Medication Sig Dispense Refill  . acyclovir (ZOVIRAX) 400 MG tablet TAKE 1 TABLET BY MOUTH ONCE DAILY TO  PREVENT  SHINGLES 30 tablet 0  . albuterol (VENTOLIN HFA) 108 (90 Base) MCG/ACT inhaler Inhale 2 puffs into the lungs every 6 (six) hours as needed for wheezing or shortness of breath. 8 g 3  . amLODipine (NORVASC) 10 MG tablet Take by mouth.    [name] aspirin EC 81 MG EC tablet Take 1 tablet (81 mg total) by mouth daily. 90 tablet 3  . ergocalciferol (VITAMIN D2) 1.25 MG (50000 UT) capsule Take 1 capsule (50,000 Units total) by mouth once a  week. 12 capsule 2  . feeding supplement, ENSURE ENLIVE, (ENSURE ENLIVE) LIQD Take 237 mLs by mouth 2 (two) times daily between meals. 60 Bottle 0  . Fluticasone-Salmeterol (ADVAIR DISKUS) 500-50 MCG/DOSE AEPB Inhale 1 puff into the lungs 2  (two) times daily. 1 each 3  . furosemide (LASIX) 40 MG tablet Take 1 tablet (40 mg total) by mouth daily. 90 tablet 3  . guaiFENesin-codeine (ROBITUSSIN AC) 100-10 MG/5ML syrup Take 5 mLs by mouth 3 (three) times daily as needed for cough. May cause drowsiness. Do not drive with this medication. 120 mL 0  . Ipratropium-Albuterol (COMBIVENT) 20-100 MCG/ACT AERS respimat Inhale into the lungs.    [name] ipratropium-albuterol (DUONEB) 0.5-2.5 (3) MG/3ML SOLN Take 3 mLs by nebulization every 4 (four) hours as needed. 360 mL 3  . megestrol (MEGACE ES) 625 MG/5ML suspension Take 5 mLs (625 mg total) by mouth daily. 150 mL 0  . metoprolol succinate (TOPROL XL) 25 MG 24 hr tablet Take 1.5 tablets (37.5 mg total) by mouth daily. 45 tablet 3  . montelukast (SINGULAIR) 10 MG tablet Take 1 tablet (10 mg total) by mouth at bedtime. 30 tablet 3  . nicotine polacrilex (COMMIT) 2 MG lozenge Place inside cheek.     . pantoprazole (PROTONIX) 40 MG tablet Take 1 tablet by mouth once daily 90 tablet 0  . polyethylene glycol (MIRALAX / GLYCOLAX) packet Take 17 g by mouth daily as needed for mild constipation. 14 each 0  . potassium chloride SA (KLOR-CON) 20 MEQ tablet Take 1 tablet (20 mEq total) by mouth 2 (two) times daily. 60 tablet 6  . predniSONE (DELTASONE) 20 MG tablet Take by mouth.    . spironolactone (ALDACTONE) 25 MG tablet Take 1 tablet (25 mg total) by mouth daily. 90 tablet 3   No current facility-administered medications for this visit.   Facility-Administered Medications Ordered in Other Visits  Medication Dose Route Frequency Provider Last Rate Last Admin  . 0.9 %  sodium chloride infusion   Intravenous Continuous Monia Sabal, PA-C      . sodium chloride flush (NS) 0.9 % injection 10 mL  10 mL Intracatheter PRN [name], MD   10 mL at 02/23/18 1400    PHYSICAL EXAMINATION: ECOG PERFORMANCE STATUS: 1 - Symptomatic but completely ambulatory  BP 134/68 (BP Location: Left Arm, Patient  Position: Sitting, Cuff Size: Normal)   Pulse 91   Temp 98.9 F (37.2 C) (Tympanic)   Resp 18   Ht _0  (1.575 m)   Wt 123 lb (55.8 kg)   SpO2 100%   BMI 22.50 kg/m   Filed Weights   05/07/20 1119  Weight: 123 lb (55.8 kg)    Physical Exam Constitutional:      Comments: Cachectic female patient in a wheelchair. She is accompanied by her daughter. She is on 2 L nasal cannula.  HENT:     Head: Normocephalic and atraumatic.  Eyes:     Pupils: Pupils are equal, round, and reactive to light.  Cardiovascular:     Rate and Rhythm: Normal rate and regular rhythm.  Pulmonary:     Effort: Pulmonary effort is normal. No respiratory distress.     Breath sounds: Normal breath sounds. No wheezing.  Abdominal:     General: Bowel sounds are normal. There is no distension.     Palpations: Abdomen is soft. There is no mass.     Tenderness: There is no abdominal tenderness. There is no guarding or rebound.  Musculoskeletal:        General: No tenderness. Normal range of motion.     Cervical back: Normal range of motion and neck supple.  Skin:    General: Skin is warm.  Neurological:     Mental Status: She is alert and oriented to person, place, and time.  Psychiatric:        Mood and Affect: Affect normal.    LABORATORY DATA:  I have reviewed the data as listed    Component Value Date/Time   NA 137 05/07/2020 1040   NA 141 01/25/2020 1530   K 4.4 05/07/2020 1040   CL 108 05/07/2020 1040   CO2 21 (L) 05/07/2020 1040   GLUCOSE 98 05/07/2020 1040   BUN 32 (H) 05/07/2020 1040   BUN 19 01/25/2020 1530   CREATININE 0.58 05/07/2020 1040   CALCIUM 9.3 05/07/2020 1040   PROT 6.8 05/07/2020 1040   ALBUMIN 3.7 05/07/2020 1040   AST 26 05/07/2020 1040   ALT 24 05/07/2020 1040   ALKPHOS 124 05/07/2020 1040   BILITOT 0.8 05/07/2020 1040   GFRNONAA >60 05/07/2020 1040   GFRAA NOT CALCULATED 04/17/2020 1352    No results found for: SPEP, UPEP  Lab Results  Component Value Date    WBC 9.4 05/07/2020   NEUTROABS 5.1 05/07/2020   HGB 9.8 (L) 05/07/2020   HCT 31.1 (L) 05/07/2020   MCV 86.6 05/07/2020   PLT 125 (L) 05/07/2020      Chemistry      Component Value Date/Time   NA 137 05/07/2020 1040   NA 141 01/25/2020 1530   K 4.4 05/07/2020 1040   CL 108 05/07/2020 1040   CO2 21 (L) 05/07/2020 1040   BUN 32 (H) 05/07/2020 1040   BUN 19 01/25/2020 1530   CREATININE 0.58 05/07/2020 1040      Component Value Date/Time   CALCIUM 9.3 05/07/2020 1040   ALKPHOS 124 05/07/2020 1040   AST 26 05/07/2020 1040   ALT 24 05/07/2020 1040   BILITOT 0.8 05/07/2020 1040       RADIOGRAPHIC STUDIES: I have personally reviewed the radiological images as listed and agreed with the findings in the report. No results found.   ASSESSMENT & PLAN:  IgA myeloma ([place]) #Multiple myeloma stage III high-risk cytogenetics-status post KRD-VGPR ; status post autologousstem cell transplant on 06/15/18. FEB 2021- [facility]- NEGATIVE [[facility]Dr.G on 11/22/2019]. PET scan-August 2021-no evidence of any recurrent myeloma. March 06, 2020 Myeloma panel; kappa lambda light chain ratio -negative/normal.  #Given worsening anemia  [nadir hemoglobin 7.5 during PRBC transfusion]-bone marrow biopsy was done.  Interestingly BMBx- 5% plasmacytosis-however this appears to be more polyclonal rather than monoclonal-not explain patient worsening anemia.  No other obvious causes noted at this time.  Will check A63 folic acid; haptoglobin reticulocyte count etc. Also discussed with Dr. Margie Ege.  We will also review at the hematology tumor conference.  #Today hemoglobin is 9.5.  Hold PRBC transfusion.  Await above work-up at next visit.  # Difficulty swallowing dysphagia-multinodular goiter s/p ENT evaluation; discussed with Dr.Bennett.  Defer to ENT.   # chronic respiratory failure- sec to COPD/diastolic CHF on diuretics nebulizer home O2; continue follow-up with pulmonary; Dr.A & cardiology.  # Bone  lesions /last zometa on 11/27/2017.  Holding Zometa secondary to poor dentition.  Stable on PET scan. STABLE  #Poor appetite-recommend Megace.  #Insomnia-continue melatonin; also Tylenol as needed.  # DISPOSITION:  # Follow up 3 weeks- MD; labs-cbc/ cbc/cmp/HOLD tube;-Dr.B  # 40  minutes face-to-face with the patient discussing the above plan of care; more than 50% of time spent on prognosis/ natural history; counseling and coordination.      Orders Placed This Encounter  Procedures  . CBC with Differential/Platelet    Standing Status:   Future    Number of Occurrences:   1    Standing Expiration Date:   05/07/2021  . Vitamin B12    Standing Status:   Future    Standing Expiration Date:   05/16/2021  . Folate    Standing Status:   Future    Standing Expiration Date:   05/16/2021  . Reticulocytes    Standing Status:   Future    Standing Expiration Date:   05/16/2021  . Haptoglobin    Standing Status:   Future    Standing Expiration Date:   05/16/2021  . Lactate dehydrogenase    Standing Status:   Future    Standing Expiration Date:   05/16/2021  . Thyroid Panel With TSH    Standing Status:   Future    Standing Expiration Date:   05/16/2021  . CBC with Differential/Platelet    Standing Status:   Future    Standing Expiration Date:   05/16/2021  . Comprehensive metabolic panel    Standing Status:   Future    Standing Expiration Date:   05/16/2021  . Brain natriuretic peptide    Standing Status:   Future    Standing Expiration Date:   05/16/2021   All questions were answered. The patient knows to call the clinic with any problems, questions or concerns.      [name], MD 05/16/2020 4:58 PM

## 2020-05-07 NOTE — Assessment & Plan Note (Addendum)
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 FEB 2021- [facility]- NEGATIVE [[facility],Dr.G on 11/22/2019]. PET scan-August 2021-no evidence of any recurrent myeloma. March 06, 2020 Myeloma panel; kappa lambda light chain ratio -negative/normal.  #Given worsening anemia  [nadir hemoglobin 7.5 during PRBC transfusion]-bone marrow biopsy was done.  Interestingly BMBx- 5% plasmacytosis-however this appears to be more polyclonal rather than monoclonal-not explain patient worsening anemia.  No other obvious causes noted at this time.  Will check V79 folic acid; haptoglobin reticulocyte count etc. Also discussed with Dr. Margie Ege.  We will also review at the hematology tumor conference.  #Today hemoglobin is 9.5.  Hold PRBC transfusion.  Await above work-up at next visit.  # Difficulty swallowing dysphagia-multinodular goiter s/p ENT evaluation; discussed with Dr.Bennett.  Defer to ENT.   # chronic respiratory failure- sec to COPD/diastolic CHF on diuretics nebulizer home O2; continue follow-up with pulmonary; Dr.A & cardiology.  # Bone lesions /last zometa on 11/27/2017.  Holding Zometa secondary to poor dentition.  Stable on PET scan. STABLE  #Poor appetite-recommend Megace.  #Insomnia-continue melatonin; also Tylenol as needed.  # DISPOSITION:  # Follow up 3 weeks- MD; labs-cbc/ cbc/cmp/HOLD tube;-Dr.B  # 40 minutes face-to-face with the patient discussing the above plan of care; more than 50% of time spent on prognosis/ natural history; counseling and coordination.

## 2020-05-11 ENCOUNTER — Telehealth: Payer: Self-pay | Admitting: Internal Medicine

## 2020-05-11 NOTE — Telephone Encounter (Signed)
On 10/15- spoke tor pt's daughter re: my discussion with Dr.Gasperretto; at [facility]. No new recommendations follow up as planned.will also discuss at hem- conference.  GB

## 2020-05-19 ENCOUNTER — Other Ambulatory Visit: Payer: Self-pay | Admitting: Internal Medicine

## 2020-05-19 DIAGNOSIS — C9002 Multiple myeloma in relapse: Secondary | ICD-10-CM

## 2020-05-24 ENCOUNTER — Emergency Department: Payer: Medicare Other

## 2020-05-24 ENCOUNTER — Other Ambulatory Visit: Payer: Self-pay

## 2020-05-24 ENCOUNTER — Emergency Department
Admission: EM | Admit: 2020-05-24 | Discharge: 2020-05-24 | Disposition: A | Discharge status: Home / Self Care | Payer: Medicare Other | Attending: Emergency Medicine | Admitting: Emergency Medicine

## 2020-05-24 DIAGNOSIS — I110 Hypertensive heart disease with heart failure: Secondary | ICD-10-CM | POA: Diagnosis not present

## 2020-05-24 DIAGNOSIS — Z7951 Long term (current) use of inhaled steroids: Secondary | ICD-10-CM | POA: Diagnosis not present

## 2020-05-24 DIAGNOSIS — Z20822 Contact with and (suspected) exposure to covid-19: Secondary | ICD-10-CM | POA: Diagnosis not present

## 2020-05-24 DIAGNOSIS — Z79899 Other long term (current) drug therapy: Secondary | ICD-10-CM | POA: Insufficient documentation

## 2020-05-24 DIAGNOSIS — F17210 Nicotine dependence, cigarettes, uncomplicated: Secondary | ICD-10-CM | POA: Diagnosis not present

## 2020-05-24 DIAGNOSIS — J209 Acute bronchitis, unspecified: Secondary | ICD-10-CM

## 2020-05-24 DIAGNOSIS — J441 Chronic obstructive pulmonary disease with (acute) exacerbation: Secondary | ICD-10-CM | POA: Diagnosis not present

## 2020-05-24 DIAGNOSIS — R600 Localized edema: Secondary | ICD-10-CM | POA: Insufficient documentation

## 2020-05-24 DIAGNOSIS — R0602 Shortness of breath: Secondary | ICD-10-CM | POA: Diagnosis present

## 2020-05-24 DIAGNOSIS — F419 Anxiety disorder, unspecified: Secondary | ICD-10-CM | POA: Diagnosis not present

## 2020-05-24 DIAGNOSIS — I509 Heart failure, unspecified: Secondary | ICD-10-CM | POA: Insufficient documentation

## 2020-05-24 DIAGNOSIS — Z7982 Long term (current) use of aspirin: Secondary | ICD-10-CM | POA: Insufficient documentation

## 2020-05-24 DIAGNOSIS — R0600 Dyspnea, unspecified: Secondary | ICD-10-CM

## 2020-05-24 LAB — BRAIN NATRIURETIC PEPTIDE: B Natriuretic Peptide: 574.9 pg/mL — ABNORMAL HIGH (ref 0.0–100.0)

## 2020-05-24 LAB — COMPREHENSIVE METABOLIC PANEL
ALT: 49 U/L — ABNORMAL HIGH (ref 0–44)
AST: 45 U/L — ABNORMAL HIGH (ref 15–41)
Albumin: 3.6 g/dL (ref 3.5–5.0)
Alkaline Phosphatase: 224 U/L — ABNORMAL HIGH (ref 38–126)
Anion gap: 10 (ref 5–15)
BUN: 26 mg/dL — ABNORMAL HIGH (ref 8–23)
CO2: 17 mmol/L — ABNORMAL LOW (ref 22–32)
Calcium: 9.1 mg/dL (ref 8.9–10.3)
Chloride: 110 mmol/L (ref 98–111)
Creatinine, Ser: 0.61 mg/dL (ref 0.44–1.00)
GFR, Estimated: >60 mL/min (ref >60)
Glucose, Bld: 129 mg/dL — ABNORMAL HIGH (ref 70–99)
Potassium: 4.8 mmol/L (ref 3.5–5.1)
Sodium: 137 mmol/L (ref 135–145)
Total Bilirubin: 0.5 mg/dL (ref 0.3–1.2)
Total Protein: 7.3 g/dL (ref 6.5–8.1)

## 2020-05-24 LAB — CBC
HCT: 33.1 % — ABNORMAL LOW (ref 36.0–46.0)
Hemoglobin: 10.4 g/dL — ABNORMAL LOW (ref 12.0–15.0)
MCH: 27.3 pg (ref 26.0–34.0)
MCHC: 31.4 g/dL (ref 30.0–36.0)
MCV: 86.9 fL (ref 80.0–100.0)
Platelets: 191 10*3/uL (ref 150–400)
RBC: 3.81 MIL/uL — ABNORMAL LOW (ref 3.87–5.11)
RDW: 14.5 % (ref 11.5–15.5)
WBC: 11.1 10*3/uL — ABNORMAL HIGH (ref 4.0–10.5)
nRBC: 0.4 % — ABNORMAL HIGH (ref 0.0–0.2)

## 2020-05-24 LAB — RESPIRATORY PANEL BY RT PCR (FLU A&B, COVID)
Influenza A by PCR: NEGATIVE
Influenza B by PCR: NEGATIVE
SARS Coronavirus 2 by RT PCR: NEGATIVE

## 2020-05-24 LAB — TROPONIN I (HIGH SENSITIVITY)
Troponin I (High Sensitivity): 23 ng/L — ABNORMAL HIGH (ref <18)
Troponin I (High Sensitivity): 21 ng/L — ABNORMAL HIGH (ref <18)

## 2020-05-24 MED ORDER — PREDNISONE 20 MG PO TABS: 40.0000 mg | ORAL_TABLET | Freq: Every day | ORAL | 0 refills | Status: AC

## 2020-05-24 MED ORDER — ALBUTEROL SULFATE HFA 108 (90 BASE) MCG/ACT IN AERS: 2.0000 | INHALATION_SPRAY | Freq: Four times a day (QID) | RESPIRATORY_TRACT | 3 refills | Status: DC | PRN

## 2020-05-24 MED ORDER — IOHEXOL 350 MG/ML SOLN
75.0000 mL | Freq: Once | INTRAVENOUS | Status: AC | PRN
  Administered 2020-05-24: 75 mL via INTRAVENOUS

## 2020-05-24 MED ORDER — MIDAZOLAM HCL 2 MG/2ML IJ SOLN
1.0000 mg | Freq: Once | INTRAMUSCULAR | Status: AC
  Administered 2020-05-24: 1 mg via INTRAVENOUS
  Filled 2020-05-24: qty 2

## 2020-05-24 MED ORDER — IPRATROPIUM-ALBUTEROL 0.5-2.5 (3) MG/3ML IN SOLN: 3.0000 mL | Freq: Once | RESPIRATORY_TRACT | Status: DC

## 2020-05-24 MED ORDER — IPRATROPIUM-ALBUTEROL 0.5-2.5 (3) MG/3ML IN SOLN: 3.0000 mL | RESPIRATORY_TRACT | 3 refills | Status: DC | PRN

## 2020-05-24 NOTE — ED Notes (Signed)
Pt reports shortness of breath began at aprox 1500 05/23/2020. Pt on 4L [place] baseline. Pt reports use of nebulizers at home. Pt reports improvement after nebulizations here. Pt 6L [place] at this time. Pt with some dyspnea

## 2020-05-24 NOTE — ED Notes (Signed)
Pt unable to tolerate O2 turned down to 5L, reports increased work of breathing

## 2020-05-24 NOTE — ED Triage Notes (Signed)
Patient coming [facility] from home for respiratory distress. patient has hx of copd, emphysema, rare bone cancer. Patient has taken 4 puff on inhaler of albuterol and 2 neb treatments prior to EMS arrival. patient unable to speak complete sentences at EMS arrival. Patient with accessory muscle use. pteintes respirations tight in all four lobes at EMS arrival.   Patient given 2 duonebs, 125 mg solumedrol. Patient now clear lower lobes, rhonchi bilateral upper lobes. Patient now 100% on 4l [place]. patient notally on 2L  EMS vitals: HR 64, 126/70  Patient reports SOB since 3 pm today. Patient denies pain. Patient with rhonchi all fields in triage. Patient with retractions and labored respirations.

## 2020-05-24 NOTE — ED Provider Notes (Signed)
[facility] Emergency Department Provider Note  ____________________________________________   First MD Initiated Contact with Patient 05/24/20 0030     (approximate)  I have reviewed the triage vital signs and the nursing notes.   HISTORY  Chief Complaint Shortness of Breath    HPI Brandi [name] is a 69 y.o. female with a complicated medical history as listed below that notably includes multiple myeloma currently undergoing treatment and managed by Dr. Rogue Bussing as well is a history of COPD and CHF on diuretics.  She presents by EMS for evaluation of acute onset shortness of breath.  She said that she has noticed some increased swelling in both of her legs in the last couple of days.  Around 3:00 this afternoon she became acutely short of breath and that has persisted and gradually worsened over the last few hours and so she called EMS.  Exertion and lying flat makes his symptoms worse.  The patient tried using albuterol at home but it did not seem to help.  She received 2 DuoNeb's and Solu-Medrol 125 milligrams IV by EMS prior to arrival.  Of note she chronically uses 4 L of oxygen by nasal cannula according to the patient which is what she is on right now.  It was not reported whether or not she was hypoxemic at home.  She says she is now feeling better than she felt before.  She denies fever, sore throat, chest pain, abdominal pain, nausea, vomiting, and dysuria.  Overall her symptoms were severe.         Past Medical History:  Diagnosis Date   At risk for cardiac dysfunction    Hypertension    Hypokalemia    IgA myeloma (HCC)    Multiple myeloma (HCC)    Red blood cell antibody positive, compatible PRBC difficult to obtain    S/P autologous bone marrow transplantation [facility])     Patient Active Problem List   Diagnosis Date Noted   Chronic heart failure with preserved ejection fraction (HFpEF) ([place]) 12/14/2019   S/P autologous  bone marrow transplantation ([place]) 06/29/2018   Essential hypertension 05/29/2018   At risk for cardiac dysfunction 05/26/2018   Hypokalemia 05/01/2018   Encounter for monitoring cardiotoxic drug therapy 11/08/2017   Red blood cell antibody positive, compatible PRBC difficult to obtain 10/21/2017   Cough in adult patient 07/29/2017   Hip pain, acute, right 07/29/2017   IgA myeloma ([place]) 04/08/2017   ARF (acute renal failure) ([place]) 04/05/2017    Past Surgical History:  Procedure Laterality Date   ESOPHAGOGASTRODUODENOSCOPY (EGD) WITH PROPOFOL N/A 03/30/2019   Procedure: ESOPHAGOGASTRODUODENOSCOPY (EGD) WITH PROPOFOL;  Surgeon: Jonathon Bellows, MD;  Location: [facility] ENDOSCOPY;  Service: Gastroenterology;  Laterality: N/A;   IR FLUORO GUIDE PORT INSERTION RIGHT  12/16/2017    Prior to Admission medications   Medication Sig Start Date End Date Taking? Authorizing Provider  acyclovir (ZOVIRAX) 400 MG tablet TAKE 1 TABLET BY MOUTH ONCE DAILY TO  PREVENT  SHINGLES 05/20/20   [name], MD  albuterol (VENTOLIN HFA) 108 (90 Base) MCG/ACT inhaler Inhale 2 puffs into the lungs every 6 (six) hours as needed for wheezing or shortness of breath. 05/24/20   Hinda Kehr, MD  amLODipine (NORVASC) 10 MG tablet Take by mouth.    [provider]  aspirin EC 81 MG EC tablet Take 1 tablet (81 mg total) by mouth daily. 01/25/20   Loel Dubonnet, NP  ergocalciferol (VITAMIN D2) 1.25 MG (50000 UT)  capsule Take 1 capsule (50,000 Units total) by mouth once a week. 11/29/19   [name], MD  feeding supplement, ENSURE ENLIVE, (ENSURE ENLIVE) LIQD Take 237 mLs by mouth 2 (two) times daily between meals. 04/12/17   Gouru, Illene Silver, MD  Fluticasone-Salmeterol (ADVAIR DISKUS) 500-50 MCG/DOSE AEPB Inhale 1 puff into the lungs 2 (two) times daily. 10/02/19   [name], MD  furosemide (LASIX) 40 MG tablet Take 1 tablet (40 mg total) by mouth daily. 11/12/19   Loel Dubonnet, NP   guaiFENesin-codeine (ROBITUSSIN AC) 100-10 MG/5ML syrup Take 5 mLs by mouth 3 (three) times daily as needed for cough. May cause drowsiness. Do not drive with this medication. 04/18/20   Verlon Au, NP  Ipratropium-Albuterol (COMBIVENT) 20-100 MCG/ACT AERS respimat Inhale into the lungs. 03/13/20 03/13/21  [provider]  ipratropium-albuterol (DUONEB) 0.5-2.5 (3) MG/3ML SOLN Take 3 mLs by nebulization every 4 (four) hours as needed. 05/24/20   Hinda Kehr, MD  megestrol (MEGACE ES) 625 MG/5ML suspension Take 5 mLs (625 mg total) by mouth daily. 04/16/20   [name], MD  metoprolol succinate (TOPROL XL) 25 MG 24 hr tablet Take 1.5 tablets (37.5 mg total) by mouth daily. 02/13/20   Loel Dubonnet, NP  montelukast (SINGULAIR) 10 MG tablet Take 1 tablet (10 mg total) by mouth at bedtime. 04/16/20   [name], MD  nicotine polacrilex (COMMIT) 2 MG lozenge Place inside cheek.  03/12/20 06/10/20  [provider]  pantoprazole (PROTONIX) 40 MG tablet Take 1 tablet by mouth once daily 03/07/20   [name], MD  polyethylene glycol (MIRALAX / GLYCOLAX) packet Take 17 g by mouth daily as needed for mild constipation. 04/11/17   Gouru, Illene Silver, MD  potassium chloride SA (KLOR-CON) 20 MEQ tablet Take 1 tablet (20 mEq total) by mouth 2 (two) times daily. 04/18/20   Verlon Au, NP  predniSONE (DELTASONE) 20 MG tablet Take 2 tablets (40 mg total) by mouth daily for 4 days. 05/24/20 05/28/20  Hinda Kehr, MD  spironolactone (ALDACTONE) 25 MG tablet Take 1 tablet (25 mg total) by mouth daily. 01/25/20 04/24/20  Loel Dubonnet, NP    Allergies Patient has no known allergies.  Family History  Problem Relation Age of Onset   Pneumonia Mother    Seizures Father     Social History Social History   Tobacco Use   Smoking status: Current Every Day Smoker    Packs/day: 0.50    Types: Cigarettes   Smokeless tobacco: Never Used   Tobacco comment: 2  cigarettes QOD  Vaping Use   Vaping Use: Never used  Substance Use Topics   Alcohol use: No   Drug use: No    Review of Systems Constitutional: No fever/chills Eyes: No visual changes. ENT: No sore throat. Cardiovascular: Denies chest pain. Respiratory: +shortness of breath, + orthopnea, + dyspnea on exertion Gastrointestinal: No abdominal pain.  No nausea, no vomiting.  No diarrhea.  No constipation. Genitourinary: Negative for dysuria. Musculoskeletal: Increased swelling in both legs over the last couple of days.  Negative for neck pain.  Negative for back pain. Integumentary: Negative for rash. Neurological: Negative for headaches, focal weakness or numbness.   ____________________________________________   PHYSICAL EXAM:  VITAL SIGNS: ED Triage Vitals [05/24/20 0021]  Enc Vitals Group     BP 106/72     Pulse Rate (!) 50     Resp (!) 26     Temp 97.8 F (36.6  C)     Temp src      SpO2 98 %     Weight 55.7 kg (122 lb 12.7 oz)     Height 1.575 m ('5\' 2"' )     Head Circumference      Peak Flow      Pain Score 0     Pain Loc      Pain Edu?      Excl. in [place]?    Constitutional: Alert and oriented.  Eyes: Conjunctivae are normal.  Head: Atraumatic. Nose: No congestion/rhinnorhea. Mouth/Throat: Patient is wearing a mask. Neck: No stridor.  No meningeal signs.   Cardiovascular: Bradycardia, regular rhythm. Good peripheral circulation. Grossly normal heart sounds. Respiratory: Increased respiratory rate and mild retractions but at this time lung sounds are clear in all locations with no wheezing and no coarse breath sounds. Gastrointestinal: Soft and nontender. No distention.  Musculoskeletal: 1+ pitting edema in bilateral lower extremities, no redness or suggestion of infection. No gross deformities of extremities. Neurologic:  Normal speech and language. No gross focal neurologic deficits are appreciated.  Skin:  Skin is warm, dry and intact. Psychiatric: Mood and  affect are normal. Speech and behavior are normal.  ____________________________________________   LABS (all labs ordered are listed, but only abnormal results are displayed)  Labs Reviewed  CBC - Abnormal; Notable for the following components:      Result Value   WBC 11.1 (*)    RBC 3.81 (*)    Hemoglobin 10.4 (*)    HCT 33.1 (*)    nRBC 0.4 (*)    All other components within normal limits  COMPREHENSIVE METABOLIC PANEL - Abnormal; Notable for the following components:   CO2 17 (*)    Glucose, Bld 129 (*)    BUN 26 (*)    AST 45 (*)    ALT 49 (*)    Alkaline Phosphatase 224 (*)    All other components within normal limits  BRAIN NATRIURETIC PEPTIDE - Abnormal; Notable for the following components:   B Natriuretic Peptide 574.9 (*)    All other components within normal limits  TROPONIN I (HIGH SENSITIVITY) - Abnormal; Notable for the following components:   Troponin I (High Sensitivity) 23 (*)    All other components within normal limits  TROPONIN I (HIGH SENSITIVITY) - Abnormal; Notable for the following components:   Troponin I (High Sensitivity) 21 (*)    All other components within normal limits  RESPIRATORY PANEL BY RT PCR (FLU A&B, COVID)   ____________________________________________  EKG  ED ECG REPORT I, Hinda Kehr, the attending physician, personally viewed and interpreted this ECG.  Date: 05/24/2020 EKG Time: 00: 20 Rate: 63 Rhythm: Junctional rhythm with normal rate QRS Axis: normal Intervals: LVH ST/T Wave abnormalities: Non-specific ST segment / T-wave changes, but no clear evidence of acute ischemia. Narrative Interpretation: no definitive evidence of acute ischemia; does not meet STEMI criteria.   ____________________________________________  RADIOLOGY I, Hinda Kehr, personally viewed and evaluated these images (plain radiographs) as part of my medical decision making, as well as reviewing the written report by the radiologist.  ED MD  interpretation: No acute abnormalities on CTA chest.  Chest x-ray is clear as well.  Official radiology report(s): CT Angio Chest PE W/Cm &/Or Wo Cm  Result Date: 05/24/2020 CLINICAL DATA:  Respiratory distress. EXAM: CT ANGIOGRAPHY CHEST WITH CONTRAST TECHNIQUE: Multidetector CT imaging of the chest was performed using the standard protocol during bolus administration of intravenous contrast. Multiplanar CT  image reconstructions and MIPs were obtained to evaluate the vascular anatomy. CONTRAST:  104m OMNIPAQUE IOHEXOL 350 MG/ML SOLN COMPARISON:  March 13, 2020 FINDINGS: Cardiovascular: Satisfactory opacification of the pulmonary arteries to the segmental level. No evidence of pulmonary embolism. Normal heart size. No pericardial effusion. Mediastinum/Nodes: No enlarged mediastinal, hilar, or axillary lymph nodes. The thyroid gland is enlarged and heterogeneous in appearance. This is seen on the prior study. The trachea and esophagus demonstrate no significant findings. Lungs/Pleura: Very mild, stable linear scarring is seen within the posterior aspect of the left lung base. There is no evidence of acute infiltrate, pleural effusion or pneumothorax. Upper Abdomen: No acute abnormality. Musculoskeletal: No chest wall abnormality. No acute or significant osseous findings. Review of the MIP images confirms the above findings. IMPRESSION: 1. No evidence of pulmonary embolus or other acute intrathoracic process. 2. Stable multinodular goiter. Electronically Signed   By: TVirgina NorfolkM.D.   On: 05/24/2020 03:20   DG Chest Portable 1 View  Result Date: 05/24/2020 CLINICAL DATA:  Dyspnea, COPD EXAM: PORTABLE CHEST 1 VIEW COMPARISON:  04/17/2020 FINDINGS: The heart size and mediastinal contours are within normal limits. Both lungs are clear. The visualized skeletal structures are unremarkable. IMPRESSION: No active disease. Electronically Signed   By: AFidela SalisburyMD   On: 05/24/2020 00:58     ____________________________________________   PROCEDURES   Procedure(s) performed (including Critical Care):  .1-3 Lead EKG Interpretation Performed by: FHinda Kehr MD Authorized by: FHinda Kehr MD     Interpretation: normal     ECG rate:  60   ECG rate assessment: normal     Rhythm: other rhythm     Ectopy: none     Conduction: normal       ____________________________________________   INITIAL IMPRESSION / MDM / ASSESSMENT AND PLAN / ED COURSE  As part of my medical decision making, I reviewed the following data [place] the [place] obtained from family, Nursing notes reviewed and incorporated, Labs reviewed , EKG interpreted , Old chart reviewed, Radiograph reviewed  and Notes from prior ED visits   Differential diagnosis includes, but is not limited to, COPD exacerbation, CHF exacerbation, PE, less likely ACS.  The patient is on the cardiac monitor to evaluate for evidence of arrhythmia and/or significant heart rate changes.  Most likely patient is suffering from a COPD exacerbation given that she improved after treatment in route to the hospital, but she is also been having increased peripheral edema for the last couple of days suggesting a CHF component.  Additionally, she has an ongoing neoplastic process and is at increased risk for pulmonary embolism although she has had no chest pain and does not seem to be increasingly hypoxemic from baseline.  Additionally she has an EKG demonstrating a junctional rhythm with a normal rate rhythm and tachycardia but is unclear the clinical significance of this finding.    She is feeling better at this time he does not require BiPAP.  I am awaiting lab results and chest x-ray for further assessment.  Disposition unclear at this time.  Patient is comfortable with the plan for continued observation and treatment as needed.      Clinical Course as of May 24 358  Sat May 24, 2020  0226 Patient  remains feeling better than before but is still tachypneic.  Given how clear her lungs sounded in spite of her initial respiratory distress and the complicating factors of her multiple myeloma, I will proceed with  CTA chest to rule out pulmonary embolism and to better evaluate the lung parenchyma.   [CF]  0226 Very mild elevation of troponin at 23 which we will repeat.  Covid swab is pending.  Comprehensive metabolic panel is generally normal other than a mild elevation of her LFTs but she is having no abdominal pain so this is of unclear clinical significance.   [CF]  0227 Mild elevation of BNP but much lower than it has been relatively recently.  B Natriuretic Peptide(!): 574.9 [CF]  0233 The patient's anxiety level is increasing.  She became anxious when she walked to the bathroom and also became more short of breath.  She started to "run out of the bathroom" as per her nurse because she was so anxious and now she is worried about going to the scanner.  This is making her even more tachypneic.  To help with her comfort and work of breathing I am ordering Versed 1 mg IV.   [CF]  0302 Slightly improving HS troponin  Troponin I (High Sensitivity)(!): 21 [CF]  0324 Reassuring CT scan without evidence of PE.  Patient seems to be having a COPD exacerbation and possibly mild CHF exacerbation though I do not believe she would benefit from additional diuresis.  Will reassess to determine if she feels comfortable going home or will need to be admitted.  CT Angio Chest PE W/Cm &/Or Wo Cm [CF]  7915 I discussed the situation extensively with the patient and her daughter.  She said that she feels much better and only starts feeling short of breath when she goes to the bathroom because it is a small closed space.  I explained that I can admit her to the hospital for worsening shortness of breath and COPD exacerbation but that anxiety and claustrophobia is probably not a reason to stay in the hospital, and the  patient and her daughter agree.  I offered multiple times for her to stay for her acute dyspnea but she says that she would rather go home.  They ask about anxiety medicine but I recommended they follow-up with an outpatient doctor to discuss this further.  They understand and agree with the plan and would rather try seeing how she does at home.  She is still having no abdominal pain and I reassessed her lungs which are clear to auscultation in all quadrants.  She has home oxygen.  I am refilling her albuterol nebulizer liquid and inhaler as well as giving a short burst course of steroids.  I gave my usual and customary return precautions.   [CF]  [phone] Of note I personally reviewed the chest x-ray and agree with radiology interpretation that there are no acute findings.   [CF]    Clinical Course User Index [CF] Hinda Kehr, MD     ____________________________________________  FINAL CLINICAL IMPRESSION(S) / ED DIAGNOSES  Final diagnoses:  Acute dyspnea  Anxiety  COPD exacerbation ([place])     MEDICATIONS GIVEN DURING THIS VISIT:  Medications  midazolam (VERSED) injection 1 mg (1 mg Intravenous Given 05/24/20 0248)  iohexol (OMNIPAQUE) 350 MG/ML injection 75 mL (75 mLs Intravenous Contrast Given 05/24/20 0257)     ED Discharge Orders         Ordered    ipratropium-albuterol (DUONEB) 0.5-2.5 (3) MG/3ML SOLN  Every 4 hours PRN        05/24/20 0358    albuterol (VENTOLIN HFA) 108 (90 Base) MCG/ACT inhaler  Every 6 hours PRN  05/24/20 0358    predniSONE (DELTASONE) 20 MG tablet  Daily        05/24/20 0358          *Please note:  Brandi [name] was evaluated in Emergency Department on 05/24/2020 for the symptoms described in the history of present illness. She was evaluated in the context of the global COVID-19 pandemic, which necessitated consideration that the patient might be at risk for infection with the SARS-CoV-2 virus that causes COVID-19. Institutional protocols  and algorithms that pertain to the evaluation of patients at risk for COVID-19 are in a state of rapid change based on information released by regulatory bodies including the CDC and federal and state organizations. These policies and algorithms were followed during the patient's care in the ED.  Some ED evaluations and interventions may be delayed as a result of limited staffing during and after the pandemic.*  Note:  This document was prepared using Dragon voice recognition software and may include unintentional dictation errors.   Hinda Kehr, MD 05/24/20 [phone]

## 2020-05-24 NOTE — ED Notes (Signed)
Pt provided with cup of ice water per request  Pt ambulated to toilet per request with portable o2 tank. Pt ambulates well, did not need assistance. After sitting down on the toilet, pt became anxious and immediately stood up to leave the room. Pt reported feeling extremely anxious/claustrophobic. Pt given medication for anxiety for CT scan per request. VS stable

## 2020-05-24 NOTE — ED Notes (Signed)
Patient verbalizes understanding of discharge instructions. Opportunity for questioning and answers were provided. Armband removed by staff, pt discharged from ED. Wheeled out to car, switched over to home O2 tank

## 2020-05-27 ENCOUNTER — Other Ambulatory Visit: Payer: Self-pay

## 2020-05-27 DIAGNOSIS — C9000 Multiple myeloma not having achieved remission: Secondary | ICD-10-CM

## 2020-05-28 ENCOUNTER — Inpatient Hospital Stay: Payer: Medicare Other | Attending: Internal Medicine

## 2020-05-28 ENCOUNTER — Inpatient Hospital Stay (HOSPITAL_BASED_OUTPATIENT_CLINIC_OR_DEPARTMENT_OTHER): Payer: Medicare Other | Admitting: Internal Medicine

## 2020-05-28 ENCOUNTER — Other Ambulatory Visit: Payer: Self-pay

## 2020-05-28 ENCOUNTER — Encounter: Payer: Self-pay | Admitting: Internal Medicine

## 2020-05-28 DIAGNOSIS — C9000 Multiple myeloma not having achieved remission: Secondary | ICD-10-CM | POA: Diagnosis present

## 2020-05-28 DIAGNOSIS — I110 Hypertensive heart disease with heart failure: Secondary | ICD-10-CM | POA: Diagnosis not present

## 2020-05-28 DIAGNOSIS — J9610 Chronic respiratory failure, unspecified whether with hypoxia or hypercapnia: Secondary | ICD-10-CM | POA: Insufficient documentation

## 2020-05-28 DIAGNOSIS — J449 Chronic obstructive pulmonary disease, unspecified: Secondary | ICD-10-CM | POA: Insufficient documentation

## 2020-05-28 DIAGNOSIS — E0590 Thyrotoxicosis, unspecified without thyrotoxic crisis or storm: Secondary | ICD-10-CM | POA: Diagnosis not present

## 2020-05-28 DIAGNOSIS — D649 Anemia, unspecified: Secondary | ICD-10-CM | POA: Diagnosis not present

## 2020-05-28 DIAGNOSIS — F17210 Nicotine dependence, cigarettes, uncomplicated: Secondary | ICD-10-CM | POA: Insufficient documentation

## 2020-05-28 DIAGNOSIS — Z79899 Other long term (current) drug therapy: Secondary | ICD-10-CM | POA: Diagnosis not present

## 2020-05-28 DIAGNOSIS — J209 Acute bronchitis, unspecified: Secondary | ICD-10-CM | POA: Diagnosis not present

## 2020-05-28 LAB — COMPREHENSIVE METABOLIC PANEL
ALT: 52 U/L — ABNORMAL HIGH (ref 0–44)
AST: 28 U/L (ref 15–41)
Albumin: 3.9 g/dL (ref 3.5–5.0)
Alkaline Phosphatase: 170 U/L — ABNORMAL HIGH (ref 38–126)
Anion gap: 9 (ref 5–15)
BUN: 15 mg/dL (ref 8–23)
CO2: 21 mmol/L — ABNORMAL LOW (ref 22–32)
Calcium: 9 mg/dL (ref 8.9–10.3)
Chloride: 111 mmol/L (ref 98–111)
Creatinine, Ser: 0.38 mg/dL — ABNORMAL LOW (ref 0.44–1.00)
GFR, Estimated: >60 mL/min (ref >60)
Glucose, Bld: 96 mg/dL (ref 70–99)
Potassium: 3.6 mmol/L (ref 3.5–5.1)
Sodium: 141 mmol/L (ref 135–145)
Total Bilirubin: 0.5 mg/dL (ref 0.3–1.2)
Total Protein: 7.7 g/dL (ref 6.5–8.1)

## 2020-05-28 LAB — RETICULOCYTES
Immature Retic Fract: 13.7 % (ref 2.3–15.9)
RBC.: 4.16 MIL/uL (ref 3.87–5.11)
Retic Count, Absolute: 59.9 10*3/uL (ref 19.0–186.0)
Retic Ct Pct: 1.4 % (ref 0.4–3.1)

## 2020-05-28 LAB — CBC WITH DIFFERENTIAL/PLATELET
Abs Immature Granulocytes: 0.03 10*3/uL (ref 0.00–0.07)
Basophils Absolute: 0 10*3/uL (ref 0.0–0.1)
Basophils Relative: 0 %
Eosinophils Absolute: 0.3 10*3/uL (ref 0.0–0.5)
Eosinophils Relative: 3 %
HCT: 35.5 % — ABNORMAL LOW (ref 36.0–46.0)
Hemoglobin: 11.2 g/dL — ABNORMAL LOW (ref 12.0–15.0)
Immature Granulocytes: 0 %
Lymphocytes Relative: 45 %
Lymphs Abs: 4.1 10*3/uL — ABNORMAL HIGH (ref 0.7–4.0)
MCH: 26.9 pg (ref 26.0–34.0)
MCHC: 31.5 g/dL (ref 30.0–36.0)
MCV: 85.3 fL (ref 80.0–100.0)
Monocytes Absolute: 0.6 10*3/uL (ref 0.1–1.0)
Monocytes Relative: 7 %
Neutro Abs: 4 10*3/uL (ref 1.7–7.7)
Neutrophils Relative %: 45 %
Platelets: 182 10*3/uL (ref 150–400)
RBC: 4.16 MIL/uL (ref 3.87–5.11)
RDW: 14.1 % (ref 11.5–15.5)
Smear Review: NORMAL
WBC: 9 10*3/uL (ref 4.0–10.5)
nRBC: 0.2 % (ref 0.0–0.2)

## 2020-05-28 LAB — FOLATE: Folate: 28 ng/mL (ref >5.9)

## 2020-05-28 LAB — SAMPLE TO BLOOD BANK

## 2020-05-28 LAB — LACTATE DEHYDROGENASE: LDH: 184 U/L (ref 98–192)

## 2020-05-28 LAB — BRAIN NATRIURETIC PEPTIDE: B Natriuretic Peptide: 877 pg/mL — ABNORMAL HIGH (ref 0.0–100.0)

## 2020-05-28 LAB — VITAMIN B12: Vitamin B-12: 325 pg/mL (ref 180–914)

## 2020-05-28 MED ORDER — GUAIFENESIN-CODEINE 100-10 MG/5ML PO SYRP
5.0000 mL | ORAL_SOLUTION | Freq: Three times a day (TID) | ORAL | 0 refills | Status: DC | PRN
Start: 2020-05-28 — End: 2020-06-25

## 2020-05-28 MED ORDER — MIRTAZAPINE 15 MG PO TABS: 15.0000 mg | ORAL_TABLET | Freq: Every day | ORAL | 4 refills | Status: DC

## 2020-05-28 MED ORDER — ERGOCALCIFEROL 1.25 MG (50000 UT) PO CAPS
50000.0000 [IU] | ORAL_CAPSULE | ORAL | 2 refills | Status: DC
Start: 2020-05-28 — End: 2020-06-25

## 2020-05-28 NOTE — Progress Notes (Signed)
[place] OFFICE PROGRESS NOTE  Patient Care Team: [facility], [place] as PCP - General (General Practice) End, Harrell Gave, MD as PCP - Cardiology (Cardiology) [name], [name] [name], MD as Consulting Physician (Hematology and Oncology) Ottie Glazier, MD as Consulting Physician (Pulmonary Disease)  Cancer Staging No matching staging information was found for the patient.   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 No morphologic support for residual myeloma disease. Negative for minimal residual disease by MM-MRD flow cytometry; HOLD REVLIMID [leg swelling]; AUG 2021-PET scan negative for myeloma; continue to hold Revlimid  # MARCH 2021-diastolic congestive heart failure [Dr.End]  # BMBx- OCT 2021- BMBx- 5% plasmacytosis-however this appears to be more polyclonal rather than monoclonal-not explain patient worsening anemia.  --------------------------  # 12/12- RIGHT JUGULAR DVT-x 39mn xarelto; finished April 2020; September 2020-EGD/dysphagia;  Dr. 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 -  Chemotherapy   The patient had dexamethasone (DECADRON) 4 MG tablet, 1 of 1 cycle, Start date: --, End date: -- palonosetron (ALOXI) injection 0.25 mg, 0.25 mg, Intravenous,  Once, 6 of 7 cycles Administration: 0.25 mg (11/21/2017), 0.25 mg (11/29/2017), 0.25 mg (12/05/2017), 0.25 mg (12/21/2017), 0.25 mg (12/28/2017), 0.25 mg (01/04/2018), 0.25 mg (01/18/2018), 0.25 mg (01/25/2018), 0.25 mg (02/01/2018), 0.25 mg (02/15/2018), 0.25 mg (02/22/2018), 0.25 mg (03/01/2018), 0.25 mg (03/15/2018), 0.25 mg (03/22/2018), 0.25 mg (03/30/2018), 0.25 mg (04/13/2018), 0.25 mg (04/20/2018), 0.25 mg (04/27/2018) cyclophosphamide (CYTOXAN) 500 mg in sodium chloride 0.9 % 250 mL chemo infusion, 540 mg, Intravenous,  Once, 6 of 7 cycles Administration: 500 mg (11/21/2017), 500 mg (11/29/2017), 500 mg (12/05/2017), 500 mg (12/28/2017), 500 mg (01/04/2018), 500 mg (01/18/2018), 500 mg (01/25/2018), 500 mg (02/01/2018), 500 mg (02/15/2018), 500 mg (02/22/2018), 500 mg (03/01/2018), 500 mg (03/15/2018), 500 mg (03/30/2018), 500 mg (04/13/2018), 500 mg (04/20/2018), 500 mg (04/27/2018) carfilzomib (KYPROLIS) 36 mg in dextrose 5 % 50 mL chemo infusion, 20 mg/m2 = 36 mg, Intravenous, Once, 6 of 7 cycles Administration: 36 mg (11/21/2017), 36 mg (11/22/2017), 60 mg (11/29/2017), 60 mg (11/30/2017), 60 mg (12/05/2017), 60 mg (12/06/2017), 60 mg (12/21/2017), 60 mg (12/22/2017), 60 mg (12/28/2017), 60 mg (12/29/2017), 60 mg (01/04/2018), 60 mg (01/05/2018), 60 mg (01/18/2018), 60 mg (01/19/2018), 60 mg (01/25/2018), 60 mg (01/27/2018), 60 mg (02/01/2018), 60 mg (02/02/2018), 60 mg (02/15/2018), 60 mg (02/16/2018), 60 mg (02/22/2018), 60 mg  (03/01/2018), 60 mg (03/02/2018), 60 mg (03/15/2018), 60 mg (03/16/2018), 60 mg (03/23/2018), 60 mg (03/30/2018), 60 mg (03/31/2018), 60 mg (04/13/2018), 60 mg (04/14/2018), 60 mg (04/20/2018), 60 mg (04/21/2018),  60 mg (04/27/2018)  for chemotherapy treatment.     INTERVAL HISTORY:  Brandi [name] 69 y.o.  female pleasant patient above history of relapsed multiple myeloma--most recently not on maintenance Revlimid [on since Feb 2021] is here for follow-up.  In the interim patient was evaluated in the emergency room because of acute dyspnea/presyncope.  CTA chest negative for acute PE.  Patient symptoms improved with antianxiety medication.  Patient continues to complain of difficulty sleeping at night.  Positive for weight loss.  Poor appetite.  Feels anxious.  Review of Systems  Constitutional: Positive for malaise/fatigue and weight loss. Negative for chills, diaphoresis and fever.  HENT: Negative for nosebleeds and sore throat.   Eyes: Negative for double vision.  Respiratory: Positive for cough, sputum production and shortness of breath. Negative for hemoptysis.   Cardiovascular: Positive for leg swelling. Negative for chest pain, palpitations and orthopnea.  Gastrointestinal: Negative for abdominal pain, blood in stool, constipation, heartburn and melena.  Genitourinary: Negative for dysuria, frequency and urgency.  Musculoskeletal: Positive for back pain and myalgias.  Skin: Negative.  Negative for itching and rash.  Neurological: Negative for dizziness, tingling, focal weakness, weakness and headaches.  Endo/Heme/Allergies: Does not bruise/bleed easily.  Psychiatric/Behavioral: Negative for depression. The patient has insomnia. The patient is not nervous/anxious.     PAST MEDICAL HISTORY :  Past Medical History:  Diagnosis Date  . Anxiety   . At risk for cardiac dysfunction   . Hypertension   . Hypokalemia   . IgA myeloma ([place])   . Multiple myeloma ([employer])   . Red blood cell antibody  positive, compatible PRBC difficult to obtain   . S/P autologous bone marrow transplantation ([place])     PAST SURGICAL HISTORY :   Past Surgical History:  Procedure Laterality Date  . ESOPHAGOGASTRODUODENOSCOPY (EGD) WITH PROPOFOL N/A 03/30/2019   Procedure: ESOPHAGOGASTRODUODENOSCOPY (EGD) WITH PROPOFOL;  Surgeon: Jonathon Bellows, MD;  Location: [facility] ENDOSCOPY;  Service: Gastroenterology;  Laterality: N/A;  . IR FLUORO GUIDE PORT INSERTION RIGHT  12/16/2017    FAMILY HISTORY :   Family History  Problem Relation Age of Onset  . Pneumonia Mother   . Seizures Father     SOCIAL HISTORY:   Social History   Tobacco Use  . Smoking status: Current Every Day Smoker    Packs/day: 0.50    Types: Cigarettes  . Smokeless tobacco: Never Used  . Tobacco comment: 2 cigarettes QOD  Vaping Use  . Vaping Use: Never used  Substance Use Topics  . Alcohol use: No  . Drug use: No    ALLERGIES:  has No Known Allergies.  MEDICATIONS:  Current Outpatient Medications  Medication Sig Dispense Refill  . acyclovir (ZOVIRAX) 400 MG tablet TAKE 1 TABLET BY MOUTH ONCE DAILY TO  PREVENT  SHINGLES 30 tablet 0  . albuterol (VENTOLIN HFA) 108 (90 Base) MCG/ACT inhaler Inhale 2 puffs into the lungs every 6 (six) hours as needed for wheezing or shortness of breath. 8 g 3  . amLODipine (NORVASC) 10 MG tablet Take by mouth.    [name] aspirin EC 81 MG EC tablet Take 1 tablet (81 mg total) by mouth daily. 90 tablet 3  . ergocalciferol (VITAMIN D2) 1.25 MG (50000 UT) capsule Take 1 capsule (50,000 Units total) by mouth once a week. 12 capsule 2  . feeding supplement, ENSURE ENLIVE, (ENSURE ENLIVE) LIQD Take 237 mLs by mouth 2 (two) times daily between meals. 60 Bottle 0  . Fluticasone-Salmeterol (ADVAIR DISKUS) 500-50 MCG/DOSE  AEPB Inhale 1 puff into the lungs 2 (two) times daily. 1 each 3  . furosemide (LASIX) 40 MG tablet Take 1 tablet (40 mg total) by mouth daily. 90 tablet 3  . guaiFENesin-codeine (ROBITUSSIN AC) 100-10  MG/5ML syrup Take 5 mLs by mouth 3 (three) times daily as needed for cough. May cause drowsiness. Do not drive with this medication. 120 mL 0  . Ipratropium-Albuterol (COMBIVENT) 20-100 MCG/ACT AERS respimat Inhale into the lungs.    [name] ipratropium-albuterol (DUONEB) 0.5-2.5 (3) MG/3ML SOLN Take 3 mLs by nebulization every 4 (four) hours as needed. 360 mL 3  . megestrol (MEGACE ES) 625 MG/5ML suspension Take 5 mLs (625 mg total) by mouth daily. 150 mL 0  . metoprolol succinate (TOPROL XL) 25 MG 24 hr tablet Take 1.5 tablets (37.5 mg total) by mouth daily. 45 tablet 3  . montelukast (SINGULAIR) 10 MG tablet Take 1 tablet (10 mg total) by mouth at bedtime. 30 tablet 3  . nicotine polacrilex (COMMIT) 2 MG lozenge Place inside cheek.     . pantoprazole (PROTONIX) 40 MG tablet Take 1 tablet by mouth once daily 90 tablet 0  . polyethylene glycol (MIRALAX / GLYCOLAX) packet Take 17 g by mouth daily as needed for mild constipation. 14 each 0  . potassium chloride SA (KLOR-CON) 20 MEQ tablet Take 1 tablet (20 mEq total) by mouth 2 (two) times daily. 60 tablet 6  . methimazole (TAPAZOLE) 10 MG tablet Take 2 tablets (20 mg total) by mouth daily. 60 tablet 1  . mirtazapine (REMERON) 15 MG tablet Take 1 tablet (15 mg total) by mouth at bedtime. 30 tablet 4  . spironolactone (ALDACTONE) 25 MG tablet Take 1 tablet (25 mg total) by mouth daily. 90 tablet 3   No current facility-administered medications for this visit.   Facility-Administered Medications Ordered in Other Visits  Medication Dose Route Frequency Provider Last Rate Last Admin  . 0.9 %  sodium chloride infusion   Intravenous Continuous Monia Sabal, PA-C      . sodium chloride flush (NS) 0.9 % injection 10 mL  10 mL Intracatheter PRN [name], MD   10 mL at 02/23/18 1400    PHYSICAL EXAMINATION: ECOG PERFORMANCE STATUS: 1 - Symptomatic but completely ambulatory  BP (!) 119/59   Pulse (!) 56   Temp 99.1 F (37.3 C) (Tympanic)    Resp 18   Wt 120 lb 8 oz (54.7 kg)   SpO2 100% Comment: on 4L 02  BMI 22.04 kg/m   Filed Weights   05/28/20 1059  Weight: 120 lb 8 oz (54.7 kg)    Physical Exam Constitutional:      Comments: Cachectic female patient in a wheelchair. She is accompanied by her daughter. She is on 2 L nasal cannula.  HENT:     Head: Normocephalic and atraumatic.  Eyes:     Pupils: Pupils are equal, round, and reactive to light.  Cardiovascular:     Rate and Rhythm: Normal rate and regular rhythm.  Pulmonary:     Effort: Pulmonary effort is normal. No respiratory distress.     Breath sounds: Normal breath sounds. No wheezing.  Abdominal:     General: Bowel sounds are normal. There is no distension.     Palpations: Abdomen is soft. There is no mass.     Tenderness: There is no abdominal tenderness. There is no guarding or rebound.  Musculoskeletal:        General: No tenderness. Normal range of  motion.     Cervical back: Normal range of motion and neck supple.  Skin:    General: Skin is warm.  Neurological:     Mental Status: She is alert and oriented to person, place, and time.  Psychiatric:        Mood and Affect: Affect normal.    LABORATORY DATA:  I have reviewed the data as listed    Component Value Date/Time   NA 141 05/28/2020 1026   NA 141 01/25/2020 1530   K 3.6 05/28/2020 1026   CL 111 05/28/2020 1026   CO2 21 (L) 05/28/2020 1026   GLUCOSE 96 05/28/2020 1026   BUN 15 05/28/2020 1026   BUN 19 01/25/2020 1530   CREATININE 0.38 (L) 05/28/2020 1026   CALCIUM 9.0 05/28/2020 1026   PROT 7.7 05/28/2020 1026   ALBUMIN 3.9 05/28/2020 1026   AST 28 05/28/2020 1026   ALT 52 (H) 05/28/2020 1026   ALKPHOS 170 (H) 05/28/2020 1026   BILITOT 0.5 05/28/2020 1026   GFRNONAA >60 05/28/2020 1026   GFRAA NOT CALCULATED 04/17/2020 1352    No results found for: SPEP, UPEP  Lab Results  Component Value Date   WBC 9.0 05/28/2020   NEUTROABS 4.0 05/28/2020   HGB 11.2 (L) 05/28/2020    HCT 35.5 (L) 05/28/2020   MCV 85.3 05/28/2020   PLT 182 05/28/2020      Chemistry      Component Value Date/Time   NA 141 05/28/2020 1026   NA 141 01/25/2020 1530   K 3.6 05/28/2020 1026   CL 111 05/28/2020 1026   CO2 21 (L) 05/28/2020 1026   BUN 15 05/28/2020 1026   BUN 19 01/25/2020 1530   CREATININE 0.38 (L) 05/28/2020 1026      Component Value Date/Time   CALCIUM 9.0 05/28/2020 1026   ALKPHOS 170 (H) 05/28/2020 1026   AST 28 05/28/2020 1026   ALT 52 (H) 05/28/2020 1026   BILITOT 0.5 05/28/2020 1026       RADIOGRAPHIC STUDIES: I have personally reviewed the radiological images as listed and agreed with the findings in the report. No results found.   ASSESSMENT & PLAN:  IgA myeloma ([place]) #Multiple myeloma stage III high-risk cytogenetics-status post KRD-VGPR ; status post autologousstem cell transplant on 06/15/18. FEB 2021- [facility]- NEGATIVE [[facility],Dr.G on 11/22/2019]. PET scan-August 2021-no evidence of any recurrent myeloma. March 06, 2020 Myeloma panel; kappa lambda light chain ratio -negative/normal.  #Given worsening anemia  [nadir hemoglobin 7.5 during PRBC transfusion]-bone marrow biopsy was done.  Interestingly BMBx- 5% plasmacytosis-however this appears to be more polyclonal rather than monoclonal-not explain patient worsening anemia.  Today hemoglobin 11.5.  LDH; haptoglobin/B12 normal.  See below regarding-hyperthyroidism.  Recommend [facility] in the month.  #Goiter-status post evaluation with ENT; not a surgical candidate at this time.  Awaiting thyroid profile.  # Panic attack/weight loss/insomnia- add remeron 15 mg qhs; see above work-up  # chronic respiratory failure- sec to COPD/diastolic CHF on diuretics nebulizer home O2; continue follow-up with pulmonary; Dr.A & cardiology.  # Bone lesions /last zometa on 11/27/2017.  Holding Zometa secondary to poor dentition.  Stable on PET scan. STABLE.   # DISPOSITION:  # Follow up 1 month- MD;  labs-cbc/ cbc/cmp/HOLD tube;-Dr.B  Addendum: Thyroid profile suggestive of hypothyroidism T4 14; TSH < 0.005.  Discussed with Dr. Gabriel Carina endocrinology.  Recommend starting the patient on methimazole 20 mg once a day [prescription sent to pharmacy].  I discussed the above with  patient's daughter Cherlyn Cushing over the phone.   Cc; Dr.Bennett.       Orders Placed This Encounter  Procedures  . Hold Tube- Blood Bank    Standing Status:   Future    Standing Expiration Date:   05/28/2021   All questions were answered. The patient knows to call the clinic with any problems, questions or concerns.      [name], MD 05/30/2020 1:34 PM

## 2020-05-28 NOTE — Assessment & Plan Note (Addendum)
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 FEB 2021- [facility]- NEGATIVE [[facility],Dr.G on 11/22/2019]. PET scan-August 2021-no evidence of any recurrent myeloma. March 06, 2020 Myeloma panel; kappa lambda light chain ratio -negative/normal.  #Given worsening anemia  [nadir hemoglobin 7.5 during PRBC transfusion]-bone marrow biopsy was done.  Interestingly BMBx- 5% plasmacytosis-however this appears to be more polyclonal rather than monoclonal-not explain patient worsening anemia.  Today hemoglobin 11.5.  LDH; haptoglobin/B12 normal.  See below regarding-hyperthyroidism.  Recommend keep appointment at [facility] in the month.  #Goiter-status post evaluation with ENT; not a surgical candidate at this time.  Awaiting thyroid profile.  # Panic attack/weight loss/insomnia- add remeron 15 mg qhs; see above work-up  # chronic respiratory failure- sec to COPD/diastolic CHF on diuretics nebulizer home O2; continue follow-up with pulmonary; Dr.A & cardiology.  # Bone lesions /last zometa on 11/27/2017.  Holding Zometa secondary to poor dentition.  Stable on PET scan. STABLE.   # DISPOSITION:  # Follow up 1 month- MD; labs-cbc/ cbc/cmp/HOLD tube;-Dr.B  Addendum: Thyroid profile suggestive of hypothyroidism T4 14; TSH < 0.005.  Discussed with Dr. Gabriel Carina endocrinology.  Recommend starting the patient on methimazole 20 mg once a day [prescription sent to pharmacy].  I discussed the above with patient's daughter Cherlyn Cushing over the phone.   Cc; Dr.Bennett.

## 2020-05-28 NOTE — Progress Notes (Signed)
Pt requesting refills on Robitussin and Vit. D.- pended refills. She went to the ER Friday night after "blacking out" and unable to breath well at home. Daughter reports she was dx with anxiety and requesting RX for medication.

## 2020-05-29 LAB — THYROID PANEL WITH TSH
Free Thyroxine Index: 6 — ABNORMAL HIGH (ref 1.2–4.9)
T3 Uptake Ratio: 41 % — ABNORMAL HIGH (ref 24–39)
T4, Total: 14.7 ug/dL — ABNORMAL HIGH (ref 4.5–12.0)
TSH: <0.005 u[IU]/mL — ABNORMAL LOW (ref 0.450–4.500)

## 2020-05-29 LAB — HAPTOGLOBIN: Haptoglobin: 168 mg/dL (ref 37–355)

## 2020-05-30 ENCOUNTER — Telehealth: Payer: Self-pay | Admitting: *Deleted

## 2020-05-30 ENCOUNTER — Other Ambulatory Visit: Payer: Self-pay | Admitting: *Deleted

## 2020-05-30 DIAGNOSIS — E042 Nontoxic multinodular goiter: Secondary | ICD-10-CM

## 2020-05-30 DIAGNOSIS — C9002 Multiple myeloma in relapse: Secondary | ICD-10-CM

## 2020-05-30 DIAGNOSIS — E039 Hypothyroidism, unspecified: Secondary | ICD-10-CM

## 2020-05-30 MED ORDER — METHIMAZOLE 10 MG PO TABS: 20.0000 mg | ORAL_TABLET | Freq: Every day | ORAL | 1 refills | Status: DC

## 2020-05-30 NOTE — Telephone Encounter (Signed)
Urgent Referral faxed to Dr. Nilda Simmer per v/o Dr. Rogue Bussing

## 2020-06-04 ENCOUNTER — Ambulatory Visit: Payer: Medicare Other | Admitting: Internal Medicine

## 2020-06-05 ENCOUNTER — Encounter: Payer: Self-pay | Admitting: Internal Medicine

## 2020-06-12 NOTE — Progress Notes (Signed)
Nutrition Follow-up:  Patient with multiple myeloma, followed by Dr. Milderd Meager with patient via phone for nutrition follow-up.  Patient reports that her appetite is good and she has been eating 2 meals per day.  Mostly sandwiches and chips for lunch and evening meal with meat and couple side items.  Drinks ensure/boost shakes 3 times per day, sometimes more if does not eat well.  Denies trouble swallowing says that has improved.     Medications: reviewed  Labs: reviewed  Anthropometrics:   Weight 120 lb 8 oz on 11/3.  Patient reports 133 lb recently at the doctor's office (some swelling reported)   NUTRITION DIAGNOSIS: Inadequate oral intake improved   INTERVENTION:  Patient to continue eating well-balanced diet with good sources of protein to maintain weight Continue oral nutrition supplements to prevent weight loss Patient has RD contact information and will call RD if needed in the future   NEXT VISIT: no follow-up planned  Earnest Thalman B. Zenia Resides, [place], [place] Registered Dietitian [phone] (mobile)

## 2020-06-14 IMAGING — CT CT BIOPSY
1 of 2 series · 9 of 14 positions shown, 12 images · non-contrast
Comparison: none

INDICATION: 67-year-old with multiple myeloma. Previous bone marrow biopsy on
10/31/2017

[Series 2: i-spiral 5.0 b30f · axial · 0.52mm/px · z∈[+769,+867]mm · 9 of 36 slices shown, 12 images]
[im 4/36  soft-tissue]
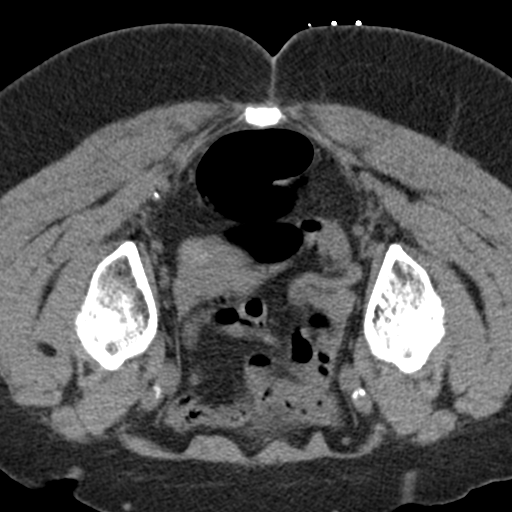
[im 4/36  bone]
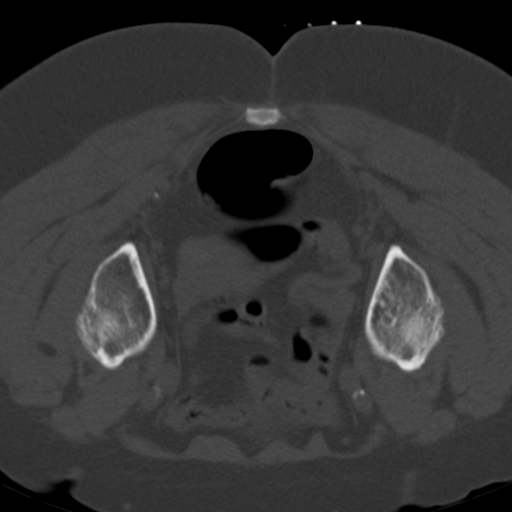
[im 8/36  bone]
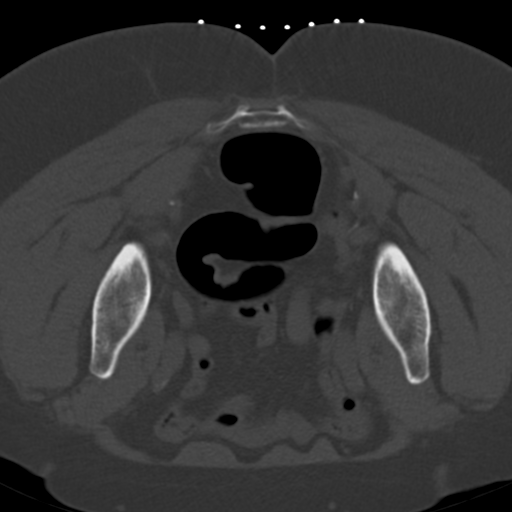
[im 11/36  bone]
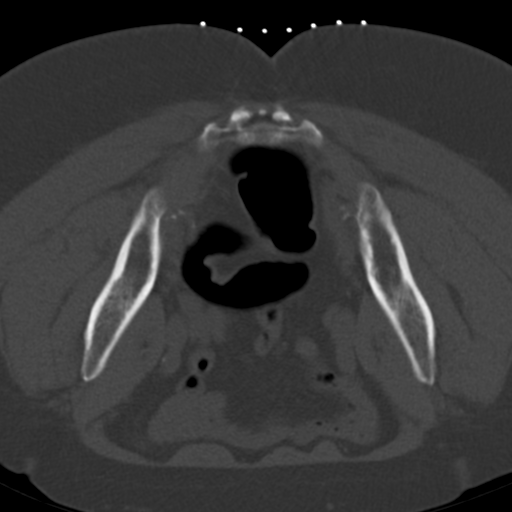
[im 15/36  bone]
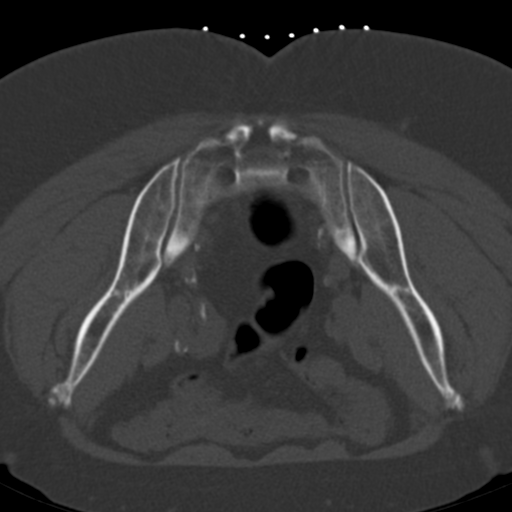
[im 18/36  soft-tissue]
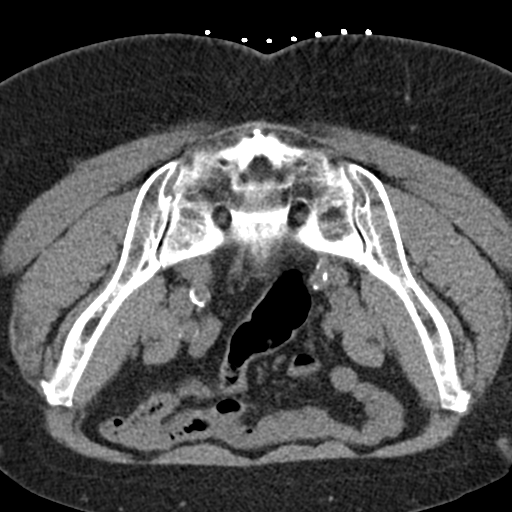
[im 18/36  bone]
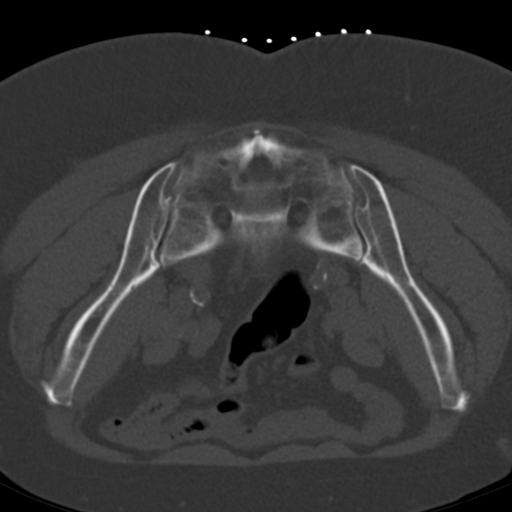
[im 22/36  bone]
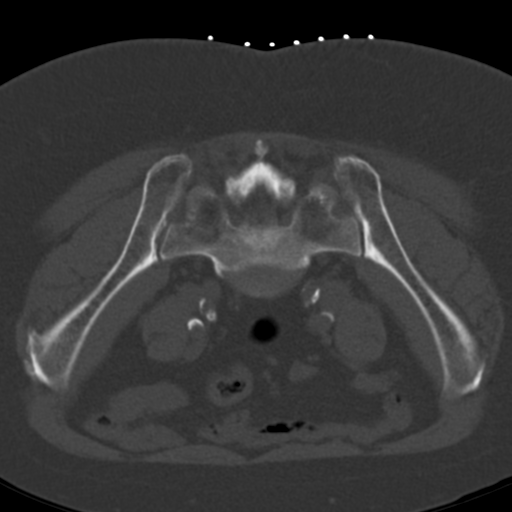
[im 25/36  bone]
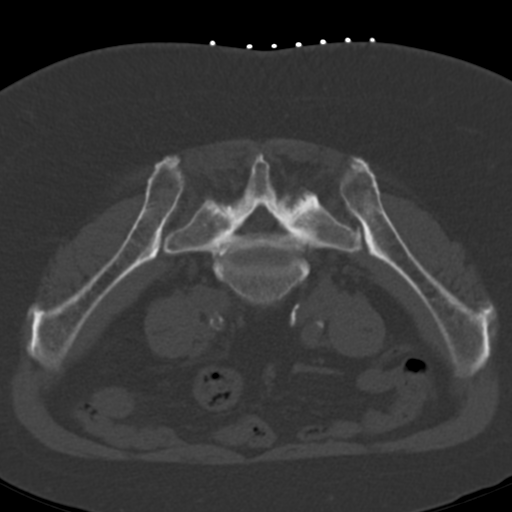
[im 29/36  bone]
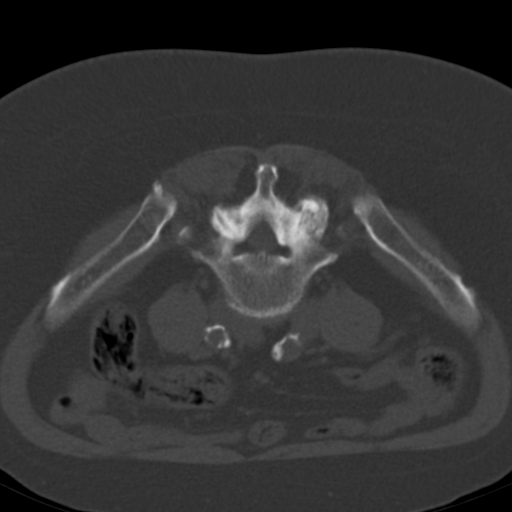
[im 32/36  soft-tissue]
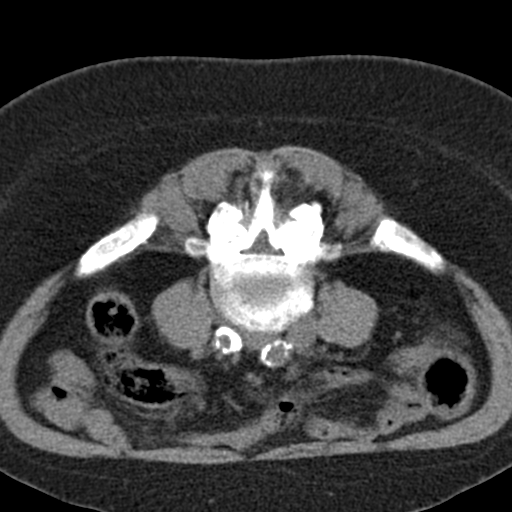
[im 32/36  bone]
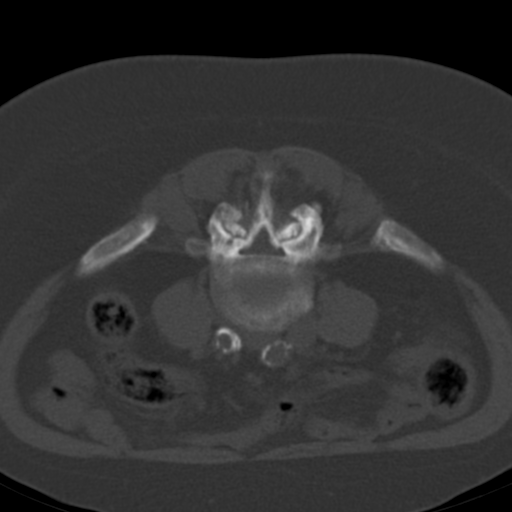

[9 of 14 positions shown; findings below may reference images not displayed]

EXAM:
CT GUIDED BONE MARROW ASPIRATES AND BIOPSY

MEDICATIONS:
None.

ANESTHESIA/SEDATION:
Fentanyl 50 mcg IV; Versed 1 mg IV

Moderate Sedation Time:  27 minutes

The patient was continuously monitored during the procedure by the
interventional radiology nurse under my direct supervision.

COMPLICATIONS:
None immediate.

PROCEDURE:
The procedure was explained to the patient. The risks and benefits
of the procedure were discussed and the patient's questions were
addressed. Informed consent was obtained from the patient. The
patient was placed prone on CT table. Images of the pelvis were
obtained. Maximal barrier sterile technique was utilized including
caps, mask, sterile gowns, sterile gloves, sterile drape, hand
hygiene and skin antiseptic. The back was prepped and draped in
sterile fashion. The skin and right posterior ilium were
anesthetized with 1% lidocaine. 11 gauge bone needle was directed
into the right ilium with CT guidance. Two aspirates and two core
biopsies were obtained. Bandage placed over the puncture site.
FINDINGS: Bone needle was directed into the posterior right ilium.
IMPRESSION: CT guided bone marrow aspiration and core biopsy.

## 2020-06-25 ENCOUNTER — Other Ambulatory Visit: Payer: Self-pay

## 2020-06-25 ENCOUNTER — Inpatient Hospital Stay: Payer: Medicare Other | Attending: Internal Medicine

## 2020-06-25 ENCOUNTER — Inpatient Hospital Stay (HOSPITAL_BASED_OUTPATIENT_CLINIC_OR_DEPARTMENT_OTHER): Payer: Medicare Other | Admitting: Internal Medicine

## 2020-06-25 ENCOUNTER — Encounter: Payer: Self-pay | Admitting: Internal Medicine

## 2020-06-25 DIAGNOSIS — I110 Hypertensive heart disease with heart failure: Secondary | ICD-10-CM | POA: Diagnosis not present

## 2020-06-25 DIAGNOSIS — E0500 Thyrotoxicosis with diffuse goiter without thyrotoxic crisis or storm: Secondary | ICD-10-CM | POA: Insufficient documentation

## 2020-06-25 DIAGNOSIS — J209 Acute bronchitis, unspecified: Secondary | ICD-10-CM | POA: Diagnosis not present

## 2020-06-25 DIAGNOSIS — C9000 Multiple myeloma not having achieved remission: Secondary | ICD-10-CM | POA: Diagnosis not present

## 2020-06-25 DIAGNOSIS — Z79899 Other long term (current) drug therapy: Secondary | ICD-10-CM | POA: Diagnosis not present

## 2020-06-25 DIAGNOSIS — C9002 Multiple myeloma in relapse: Secondary | ICD-10-CM

## 2020-06-25 DIAGNOSIS — F17210 Nicotine dependence, cigarettes, uncomplicated: Secondary | ICD-10-CM | POA: Diagnosis not present

## 2020-06-25 LAB — COMPREHENSIVE METABOLIC PANEL
ALT: 18 U/L (ref 0–44)
AST: 26 U/L (ref 15–41)
Albumin: 3.5 g/dL (ref 3.5–5.0)
Alkaline Phosphatase: 178 U/L — ABNORMAL HIGH (ref 38–126)
Anion gap: 10 (ref 5–15)
BUN: 17 mg/dL (ref 8–23)
CO2: 21 mmol/L — ABNORMAL LOW (ref 22–32)
Calcium: 9.6 mg/dL (ref 8.9–10.3)
Chloride: 109 mmol/L (ref 98–111)
Creatinine, Ser: 0.45 mg/dL (ref 0.44–1.00)
GFR, Estimated: >60 mL/min (ref >60)
Glucose, Bld: 115 mg/dL — ABNORMAL HIGH (ref 70–99)
Potassium: 4.1 mmol/L (ref 3.5–5.1)
Sodium: 140 mmol/L (ref 135–145)
Total Bilirubin: 0.5 mg/dL (ref 0.3–1.2)
Total Protein: 7.5 g/dL (ref 6.5–8.1)

## 2020-06-25 LAB — CBC WITH DIFFERENTIAL/PLATELET
Abs Immature Granulocytes: 0.01 10*3/uL (ref 0.00–0.07)
Basophils Absolute: 0 10*3/uL (ref 0.0–0.1)
Basophils Relative: 1 %
Eosinophils Absolute: 0.3 10*3/uL (ref 0.0–0.5)
Eosinophils Relative: 4 %
HCT: 32.2 % — ABNORMAL LOW (ref 36.0–46.0)
Hemoglobin: 10 g/dL — ABNORMAL LOW (ref 12.0–15.0)
Immature Granulocytes: 0 %
Lymphocytes Relative: 39 %
Lymphs Abs: 2.5 10*3/uL (ref 0.7–4.0)
MCH: 26.5 pg (ref 26.0–34.0)
MCHC: 31.1 g/dL (ref 30.0–36.0)
MCV: 85.2 fL (ref 80.0–100.0)
Monocytes Absolute: 0.4 10*3/uL (ref 0.1–1.0)
Monocytes Relative: 7 %
Neutro Abs: 3.1 10*3/uL (ref 1.7–7.7)
Neutrophils Relative %: 49 %
Platelets: 246 10*3/uL (ref 150–400)
RBC: 3.78 MIL/uL — ABNORMAL LOW (ref 3.87–5.11)
RDW: 14.6 % (ref 11.5–15.5)
WBC: 6.3 10*3/uL (ref 4.0–10.5)
nRBC: 0 % (ref 0.0–0.2)

## 2020-06-25 LAB — MAGNESIUM: Magnesium: 1.6 mg/dL — ABNORMAL LOW (ref 1.7–2.4)

## 2020-06-25 LAB — SAMPLE TO BLOOD BANK

## 2020-06-25 MED ORDER — ERGOCALCIFEROL 1.25 MG (50000 UT) PO CAPS: 50000.0000 [IU] | ORAL_CAPSULE | ORAL | 2 refills | Status: DC

## 2020-06-25 MED ORDER — GUAIFENESIN-CODEINE 100-10 MG/5ML PO SYRP: 5.0000 mL | ORAL_SOLUTION | Freq: Three times a day (TID) | ORAL | 0 refills | Status: DC | PRN

## 2020-06-25 NOTE — Progress Notes (Signed)
Having neck pain on left side that radiates down to shoulder. Hurts worse at night x 1 week.

## 2020-06-25 NOTE — Assessment & Plan Note (Addendum)
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 FEB 2021- [facility]- NEGATIVE [[facility],Dr.G on 11/22/2019]. PET scan-August 2021-no evidence of any recurrent myeloma. March 06, 2020 Myeloma panel; kappa lambda light chain ratio -negative/normal.  #Maintenance therapies currently on hold given patient's multiple medical problems including patient diagnosis of hyperthyroidism.  See below.  Will consider starting maintenance Revlimid at next visit.  #Hyperthyroidism/goiter-currently on methimazole as per Dr. Gabriel Carina; endocrinology.  Defer to endocrinology for further refills of methimazole.  Reviewed the note from Dr. Gabriel Carina.  Consider radioactive iodine or thyroid ablation.  # Panic attack/weight loss/insomnia-continue remeron 15 mg qhs; see above work-up  # chronic respiratory failure- sec to COPD/diastolic CHF on diuretics nebulizer home O2; continue follow-up with pulmonary; Dr.A & cardiology.  # Bone lesions /last zometa on 11/27/2017.  Holding Zometa secondary to poor dentition.  Stable  # DISPOSITION:  # Follow up 2 month- MD; labs-cbc//cmp;MM panel; K/L light chains; Zometa Dr.B

## 2020-06-30 ENCOUNTER — Other Ambulatory Visit: Payer: Self-pay | Admitting: Family

## 2020-06-30 NOTE — Telephone Encounter (Signed)
Please reschedule appointment with Dr. Saunders Revel. Patient did not show for last appointment. Thank you!

## 2020-06-30 NOTE — Telephone Encounter (Signed)
Scheduled for 1-29

## 2020-07-02 NOTE — Progress Notes (Deleted)
Cardiology Office Note:    Date:  07/02/2020   ID:  Brandi [name], DOB March 05, 1951, MRN [id number]  PCP:  Center, [place] Cardiologist:  Nelva Bush, MD  [place] Electrophysiologist:  None   Referring MD: Center, [facility]*   Chief Complaint: 3 month follow-up  History of Present Illness:    Brandi [name] is a 69 y.o. 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 In February Revlimid was held for LE edema. Echo with preserved EF. she was treated with Xarelto for IJ DVT. In 3.2021 metoprolol was added. 10/2019 spiro was added. Last seen 02/2020 and weight was down, overall stable.     Past Medical History:  Diagnosis Date  . Anxiety   . At risk for cardiac dysfunction   . Hypertension   . Hypokalemia   . IgA myeloma ([place])   . Multiple myeloma ([place])   . Red blood cell antibody positive, compatible PRBC difficult to obtain   . S/P autologous bone marrow transplantation [facility])     Past Surgical History:  Procedure Laterality Date  . ESOPHAGOGASTRODUODENOSCOPY (EGD) WITH PROPOFOL N/A 03/30/2019   Procedure: ESOPHAGOGASTRODUODENOSCOPY (EGD) WITH PROPOFOL;  Surgeon: Jonathon Bellows, MD;  Location: [facility] ENDOSCOPY;  Service: Gastroenterology;  Laterality: N/A;  . IR FLUORO GUIDE PORT INSERTION RIGHT  12/16/2017    Current Medications: No outpatient medications have been marked as taking for the 07/03/20 encounter (Appointment) with Rise Mu, PA-C.     Allergies:   Patient has no known allergies.   Social History   Socioeconomic History  . Marital status: Single    Spouse name: Not on file  . Number of children: Not on file  . Years of education: Not on file  . Highest education level: Not on file  Occupational History  . Not on file  Tobacco Use  . Smoking status: Current Every Day Smoker     Packs/day: 0.50    Types: Cigarettes  . Smokeless tobacco: Never Used  . Tobacco comment: 2 cigarettes QOD  Vaping Use  . Vaping Use: Never used  Substance and Sexual Activity  . Alcohol use: No  . Drug use: No  . Sexual activity: Not on file  Other Topics Concern  . Not on file  Social History Narrative   Lives at home with family. Independent at baseline   Social Determinants of Health   Financial Resource Strain:   . Difficulty of Paying Living Expenses: Not on file  Food Insecurity:   . Worried About Charity fundraiser in the Last Year: Not on file  . Ran Out of Food in the Last Year: Not on file  Transportation Needs:   . Lack of Transportation (Medical): Not on file  . Lack of Transportation (Non-Medical): Not on file  Physical Activity:   . Days of Exercise per Week: Not on file  . Minutes of Exercise per Session: Not on file  Stress:   . Feeling of Stress : Not on file  Social Connections:   . Frequency of Communication with Friends and Family: Not on file  . Frequency of Social Gatherings with Friends and Family: Not on file  . Attends Religious Services: Not on file  . Active Member of Clubs or Organizations: Not on file  . Attends Archivist Meetings: Not on file  .  Marital Status: Not on file     Family History: The patient's family history includes Pneumonia in her mother; Seizures in her father.  ROS:   Please see the history of present illness.     All other systems reviewed and are negative.  EKGs/Labs/Other Studies Reviewed:    The following studies were reviewed today:  Echo 07/2019 1. Left ventricular ejection fraction, by visual estimation, is 60 to  65%. The left ventricle has normal function. There is no left ventricular  hypertrophy.  2. The left ventricle has no regional wall motion abnormalities.  3. Global right ventricle has normal systolic function.The right  ventricular size is normal. No increase in right  ventricular wall  thickness.  4. Left atrial size was normal.  5. Right atrial size was normal.  6. The mitral valve is normal in structure. Trivial mitral valve  regurgitation.  7. The tricuspid valve is normal in structure.  8. The tricuspid valve is normal in structure. Tricuspid valve  regurgitation is trivial.  9. The aortic valve is normal in structure. Aortic valve regurgitation is  trivial.  10. The pulmonic valve was normal in structure. Pulmonic valve  regurgitation is not visualized.  11. Moderately elevated pulmonary artery systolic pressure.   EKG:  EKG is ordered today.  The ekg ordered today demonstrates ***  Recent Labs: 05/28/2020: B Natriuretic Peptide 877.0; TSH <0.005 06/25/2020: ALT 18; BUN 17; Creatinine, Ser 0.45; Hemoglobin 10.0; Magnesium 1.6; Platelets 246; Potassium 4.1; Sodium 140  Recent Lipid Panel No results found for: CHOL, TRIG, HDL, CHOLHDL, VLDL, LDLCALC, LDLDIRECT   Risk Assessment/Calculations:       Physical Exam:    VS:  There were no vitals taken for this visit.    Wt Readings from Last 3 Encounters:  06/25/20 135 lb (61.2 kg)  05/28/20 120 lb 8 oz (54.7 kg)  05/24/20 122 lb 12.7 oz (55.7 kg)     GEN: *** Well nourished, well developed in no acute distress HEENT: Normal NECK: No JVD; No carotid bruits LYMPHATICS: No lymphadenopathy CARDIAC: ***RRR, no murmurs, rubs, gallops RESPIRATORY:  Clear to auscultation without rales, wheezing or rhonchi  ABDOMEN: Soft, non-tender, non-distended MUSCULOSKELETAL:  No edema; No deformity  SKIN: Warm and dry NEUROLOGIC:  Alert and oriented x 3 PSYCHIATRIC:  Normal affect   ASSESSMENT:    No diagnosis found. PLAN:    In order of problems listed above:  HFpEF Echo in 07/2019 showed preserved EF. Volume status. Continue GDNT with BB, lasix, spironolactone  HTN Amlodipine previously discontinued due to hypotension and lower extremity edema. Continue spiro, toprol, and  lasix  Multiple myeloma/anemia Follows with oncology  COPD Saw pulmonology  Disposition: Follow up {follow up:15908} with ***   Shared Decision Making/Informed Consent   {Are you ordering a CV Procedure (e.g. stress test, cath, DCCV, TEE, etc)?   Press F2        :[id number]}    Signed, Jakim Drapeau Ninfa Meeker, PA-C  07/02/2020 4:05 PM    [facility]

## 2020-07-03 ENCOUNTER — Ambulatory Visit: Payer: Medicare Other | Admitting: Physician Assistant

## 2020-07-07 ENCOUNTER — Ambulatory Visit: Payer: Medicare Other | Admitting: Physician Assistant

## 2020-07-08 ENCOUNTER — Encounter: Payer: Self-pay | Admitting: Physician Assistant

## 2020-07-15 NOTE — Progress Notes (Signed)
[place] OFFICE PROGRESS NOTE  Patient Care Team: [facility], [place] as PCP - General (General Practice) End, Harrell Gave, MD as PCP - Cardiology (Cardiology) [name], [name] [name], MD as Consulting Physician (Hematology and Oncology) Ottie Glazier, MD as Consulting Physician (Pulmonary Disease) Gabriel Carina Betsey Holiday, MD as Consulting Physician (Endocrinology)  Cancer Staging No matching staging information was found for the patient.   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 No morphologic support for residual myeloma disease. Negative for minimal residual disease by MM-MRD flow cytometry; HOLD REVLIMID [leg swelling]; AUG 2021-PET scan negative for myeloma; continue to hold Revlimid  # MARCH 2021-diastolic congestive heart failure [Dr.End]  # BMBx- OCT 2021- BMBx- 5% plasmacytosis-however this appears to be more polyclonal rather than monoclonal-not explain patient worsening anemia.  #November 2021-hyperthyroidism/goiter-  [D.Bennett/Dr.Solum]-methimazole.  --------------------------  # 12/12- RIGHT JUGULAR DVT-x 325mn xarelto; finished April 2020; September 2020-EGD/dysphagia; Dr. AnVicente Males# Acute renal failure [Dr.Singh; Proteinuria 1.5gm/day ]; acyclovir/Asprin ------------------------------------------------------------------------------------------------------------   DIAGNOSIS: _0  MULTIPLE MYELOMA  STAGE: III/HIGH RISK ;GOALS: CONTROL  CURRENT/MOST RECENT THERAPY-maintenance Revlimid- on HOLD    [place] -  Chemotherapy   The patient had dexamethasone (DECADRON) 4 MG tablet, 1 of 1 cycle, Start date: --, End date: -- palonosetron (ALOXI) injection 0.25 mg, 0.25 mg, Intravenous,  Once, 6 of 7 cycles Administration: 0.25 mg (11/21/2017), 0.25 mg (11/29/2017), 0.25 mg (12/05/2017), 0.25 mg (12/21/2017), 0.25 mg (12/28/2017), 0.25 mg (01/04/2018), 0.25 mg (01/18/2018), 0.25 mg (01/25/2018), 0.25 mg (02/01/2018), 0.25 mg (02/15/2018), 0.25 mg (02/22/2018), 0.25 mg (03/01/2018), 0.25 mg (03/15/2018), 0.25 mg (03/22/2018), 0.25 mg (03/30/2018), 0.25 mg (04/13/2018), 0.25 mg (04/20/2018), 0.25 mg (04/27/2018) cyclophosphamide (CYTOXAN) 500 mg in sodium chloride 0.9 % 250 mL chemo infusion, 540 mg, Intravenous,  Once, 6 of 7 cycles Administration: 500 mg (11/21/2017), 500 mg (11/29/2017), 500 mg (12/05/2017), 500 mg (12/28/2017), 500 mg (01/04/2018), 500 mg (01/18/2018), 500 mg (01/25/2018), 500 mg (02/01/2018), 500 mg (02/15/2018), 500 mg (02/22/2018), 500 mg (03/01/2018), 500 mg (03/15/2018), 500 mg (03/30/2018), 500 mg (04/13/2018), 500 mg (04/20/2018), 500 mg (04/27/2018) carfilzomib (KYPROLIS) 36 mg in dextrose 5 % 50 mL chemo infusion, 20 mg/m2 = 36 mg, Intravenous, Once, 6 of 7 cycles Administration: 36 mg (11/21/2017), 36 mg (11/22/2017), 60 mg (11/29/2017), 60 mg (11/30/2017), 60 mg (12/05/2017), 60 mg (12/06/2017), 60 mg (12/21/2017), 60 mg (12/22/2017), 60 mg (12/28/2017), 60 mg (12/29/2017), 60 mg (01/04/2018), 60 mg (01/05/2018), 60 mg  (01/18/2018), 60 mg (01/19/2018), 60 mg (01/25/2018), 60 mg (01/27/2018), 60 mg (02/01/2018), 60 mg (02/02/2018), 60 mg (02/15/2018), 60 mg (02/16/2018), 60 mg (02/22/2018), 60 mg (03/01/2018), 60 mg (03/02/2018), 60 mg (03/15/2018), 60 mg (03/16/2018), 60 mg (03/23/2018), 60 mg (03/30/2018), 60  mg (03/31/2018), 60 mg (04/13/2018), 60 mg (04/14/2018), 60 mg (04/20/2018), 60 mg (04/21/2018), 60 mg (04/27/2018)  for chemotherapy treatment.     INTERVAL HISTORY:  Brandi [name] 69 y.o.  female pleasant patient above history of relapsed multiple myeloma--most recently not on maintenance Revlimid [on since Feb 2021] is here for follow-up.  In the interim since last visit patient has been diagnosed with severe hyperthyroidism.  After discussion with Dr. Gabriel Carina endocrinology.  Patient has been started on methimazole.  Since started on methimazole, patient seems to be doing better.  Appetite is improving.  Shortness of breath improving.  Anxiety is also improving.  Review of Systems  Constitutional: Positive for malaise/fatigue and weight loss. Negative for chills, diaphoresis and fever.  HENT: Negative for nosebleeds and sore throat.   Eyes: Negative for double vision.  Respiratory: Positive for shortness of breath. Negative for hemoptysis.   Cardiovascular: Negative for chest pain, palpitations and orthopnea.  Gastrointestinal: Negative for abdominal pain, blood in stool, constipation, heartburn and melena.  Genitourinary: Negative for dysuria, frequency and urgency.  Musculoskeletal: Positive for back pain and myalgias.  Skin: Negative.  Negative for itching and rash.  Neurological: Negative for dizziness, tingling, focal weakness, weakness and headaches.  Endo/Heme/Allergies: Does not bruise/bleed easily.  Psychiatric/Behavioral: Negative for depression. The patient has insomnia. The patient is not nervous/anxious.     PAST MEDICAL HISTORY :  Past Medical History:  Diagnosis Date  . Anxiety   . At risk for cardiac  dysfunction   . Hypertension   . Hypokalemia   . IgA myeloma ([place])   . Multiple myeloma ([place])   . Red blood cell antibody positive, compatible PRBC difficult to obtain   . S/P autologous bone marrow transplantation ([place])     PAST SURGICAL HISTORY :   Past Surgical History:  Procedure Laterality Date  . ESOPHAGOGASTRODUODENOSCOPY (EGD) WITH PROPOFOL N/A 03/30/2019   Procedure: ESOPHAGOGASTRODUODENOSCOPY (EGD) WITH PROPOFOL;  Surgeon: Jonathon Bellows, MD;  Location[facility] ENDOSCOPY;  Service: Gastroenterology;  Laterality: N/A;  . IR FLUORO GUIDE PORT INSERTION RIGHT  12/16/2017    FAMILY HISTORY :   Family History  Problem Relation Age of Onset  . Pneumonia Mother   . Seizures Father     SOCIAL HISTORY:   Social History   Tobacco Use  . Smoking status: Current Every Day Smoker    Packs/day: 0.50    Types: Cigarettes  . Smokeless tobacco: Never Used  . Tobacco comment: 2 cigarettes QOD  Vaping Use  . Vaping Use: Never used  Substance Use Topics  . Alcohol use: No  . Drug use: No    ALLERGIES:  has No Known Allergies.  MEDICATIONS:  Current Outpatient Medications  Medication Sig Dispense Refill  . acyclovir (ZOVIRAX) 400 MG tablet TAKE 1 TABLET BY MOUTH ONCE DAILY TO  PREVENT  SHINGLES 30 tablet 0  . albuterol (VENTOLIN HFA) 108 (90 Base) MCG/ACT inhaler Inhale 2 puffs into the lungs every 6 (six) hours as needed for wheezing or shortness of breath. 8 g 3  . amLODipine (NORVASC) 10 MG tablet Take by mouth.    [name] aspirin EC 81 MG EC tablet Take 1 tablet (81 mg total) by mouth daily. 90 tablet 3  . feeding supplement, ENSURE ENLIVE, (ENSURE ENLIVE) LIQD Take 237 mLs by mouth 2 (two) times daily between meals. 60 Bottle 0  . Fluticasone-Salmeterol (ADVAIR DISKUS) 500-50 MCG/DOSE AEPB Inhale 1 puff into the lungs 2 (two) times daily. 1 each 3  .  furosemide (LASIX) 40 MG tablet Take 1 tablet (40 mg total) by mouth daily. 90 tablet 3  . Ipratropium-Albuterol (COMBIVENT) 20-100  MCG/ACT AERS respimat Inhale into the lungs.    [name] ipratropium-albuterol (DUONEB) 0.5-2.5 (3) MG/3ML SOLN Take 3 mLs by nebulization every 4 (four) hours as needed. 360 mL 3  . megestrol (MEGACE ES) 625 MG/5ML suspension Take 5 mLs (625 mg total) by mouth daily. 150 mL 0  . methimazole (TAPAZOLE) 10 MG tablet Take 2 tablets (20 mg total) by mouth daily. 60 tablet 1  . mirtazapine (REMERON) 15 MG tablet Take 1 tablet (15 mg total) by mouth at bedtime. 30 tablet 4  . montelukast (SINGULAIR) 10 MG tablet Take 1 tablet (10 mg total) by mouth at bedtime. 30 tablet 3  . pantoprazole (PROTONIX) 40 MG tablet Take 1 tablet by mouth once daily 90 tablet 0  . polyethylene glycol (MIRALAX / GLYCOLAX) packet Take 17 g by mouth daily as needed for mild constipation. 14 each 0  . potassium chloride SA (KLOR-CON) 20 MEQ tablet Take 1 tablet (20 mEq total) by mouth 2 (two) times daily. 60 tablet 6  . ergocalciferol (VITAMIN D2) 1.25 MG (50000 UT) capsule Take 1 capsule (50,000 Units total) by mouth once a week. 12 capsule 2  . guaiFENesin-codeine (ROBITUSSIN AC) 100-10 MG/5ML syrup Take 5 mLs by mouth 3 (three) times daily as needed for cough. May cause drowsiness. Do not drive with this medication. 120 mL 0  . metoprolol succinate (TOPROL-XL) 25 MG 24 hr tablet TAKE 1 & 1/2 (ONE & ONE-HALF) TABLETS BY MOUTH ONCE DAILY 45 tablet 0  . spironolactone (ALDACTONE) 25 MG tablet Take 1 tablet (25 mg total) by mouth daily. 90 tablet 3   No current facility-administered medications for this visit.   Facility-Administered Medications Ordered in Other Visits  Medication Dose Route Frequency Provider Last Rate Last Admin  . 0.9 %  sodium chloride infusion   Intravenous Continuous Monia Sabal, PA-C      . sodium chloride flush (NS) 0.9 % injection 10 mL  10 mL Intracatheter PRN [name], MD   10 mL at 02/23/18 1400    PHYSICAL EXAMINATION: ECOG PERFORMANCE STATUS: 1 - Symptomatic but completely  ambulatory  BP 133/71 (BP Location: Left Arm, Patient Position: Sitting, Cuff Size: Normal)   Pulse (!) 101   Temp 98.5 F (36.9 C) (Tympanic)   Resp 18   Ht $R'5\' 2"'dp$  (1.575 m)   Wt 135 lb (61.2 kg)   SpO2 100%   BMI 24.69 kg/m   Filed Weights   06/25/20 1509  Weight: 135 lb (61.2 kg)    Physical Exam Constitutional:      Comments: Cachectic female patient in a wheelchair. She is accompanied by her daughter. She is on 2 L nasal cannula.  HENT:     Head: Normocephalic and atraumatic.  Eyes:     Pupils: Pupils are equal, round, and reactive to light.  Cardiovascular:     Rate and Rhythm: Normal rate and regular rhythm.  Pulmonary:     Effort: Pulmonary effort is normal. No respiratory distress.     Breath sounds: Normal breath sounds. No wheezing.  Abdominal:     General: Bowel sounds are normal. There is no distension.     Palpations: Abdomen is soft. There is no mass.     Tenderness: There is no abdominal tenderness. There is no guarding or rebound.  Musculoskeletal:        General:  No tenderness. Normal range of motion.     Cervical back: Normal range of motion and neck supple.  Skin:    General: Skin is warm.  Neurological:     Mental Status: She is alert and oriented to person, place, and time.  Psychiatric:        Mood and Affect: Affect normal.    LABORATORY DATA:  I have reviewed the data as listed    Component Value Date/Time   NA 140 06/25/2020 1455   NA 141 01/25/2020 1530   K 4.1 06/25/2020 1455   CL 109 06/25/2020 1455   CO2 21 (L) 06/25/2020 1455   GLUCOSE 115 (H) 06/25/2020 1455   BUN 17 06/25/2020 1455   BUN 19 01/25/2020 1530   CREATININE 0.45 06/25/2020 1455   CALCIUM 9.6 06/25/2020 1455   PROT 7.5 06/25/2020 1455   ALBUMIN 3.5 06/25/2020 1455   AST 26 06/25/2020 1455   ALT 18 06/25/2020 1455   ALKPHOS 178 (H) 06/25/2020 1455   BILITOT 0.5 06/25/2020 1455   GFRNONAA >60 06/25/2020 1455   GFRAA NOT CALCULATED 04/17/2020 1352    No  results found for: SPEP, UPEP  Lab Results  Component Value Date   WBC 6.3 06/25/2020   NEUTROABS 3.1 06/25/2020   HGB 10.0 (L) 06/25/2020   HCT 32.2 (L) 06/25/2020   MCV 85.2 06/25/2020   PLT 246 06/25/2020      Chemistry      Component Value Date/Time   NA 140 06/25/2020 1455   NA 141 01/25/2020 1530   K 4.1 06/25/2020 1455   CL 109 06/25/2020 1455   CO2 21 (L) 06/25/2020 1455   BUN 17 06/25/2020 1455   BUN 19 01/25/2020 1530   CREATININE 0.45 06/25/2020 1455      Component Value Date/Time   CALCIUM 9.6 06/25/2020 1455   ALKPHOS 178 (H) 06/25/2020 1455   AST 26 06/25/2020 1455   ALT 18 06/25/2020 1455   BILITOT 0.5 06/25/2020 1455       RADIOGRAPHIC STUDIES: I have personally reviewed the radiological images as listed and agreed with the findings in the report. No results found.   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 FEB 2021- [facility]- NEGATIVE [[facility],Dr.G on 11/22/2019]. PET scan-August 2021-no evidence of any recurrent myeloma. March 06, 2020 Myeloma panel; kappa lambda light chain ratio -negative/normal.  #Maintenance therapies currently on hold given patient's multiple medical problems including patient diagnosis of hyperthyroidism.  See below.  Will consider starting maintenance Revlimid at next visit.  #Hyperthyroidism/goiter-currently on methimazole as per Dr. Gabriel Carina; endocrinology.  Defer to endocrinology for further refills of methimazole.  Reviewed the note from Dr. Gabriel Carina.  Consider radioactive iodine or thyroid ablation.  # Panic attack/weight loss/insomnia-continue remeron 15 mg qhs; see above work-up  # chronic respiratory failure- sec to COPD/diastolic CHF on diuretics nebulizer home O2; continue follow-up with pulmonary; Dr.A & cardiology.  # Bone lesions /last zometa on 11/27/2017.  Holding Zometa secondary to poor  dentition.  Stable  # DISPOSITION:  # Follow up 2 month- MD; labs-cbc//cmp;MM panel; K/L light chains; Zometa Dr.B     No orders of the defined types were placed in this encounter.  All questions were answered. The patient knows to call the clinic with any problems, questions or concerns.      [name], MD 07/15/2020 10:49 PM

## 2020-07-16 ENCOUNTER — Other Ambulatory Visit: Payer: Self-pay | Admitting: *Deleted

## 2020-07-16 MED ORDER — MONTELUKAST SODIUM 10 MG PO TABS
10.0000 mg | ORAL_TABLET | Freq: Every day | ORAL | 3 refills | Status: DC
Start: 2020-07-16 — End: 2021-04-28

## 2020-07-20 ENCOUNTER — Other Ambulatory Visit: Payer: Self-pay | Admitting: Internal Medicine

## 2020-07-21 ENCOUNTER — Other Ambulatory Visit: Payer: Self-pay | Admitting: Internal Medicine

## 2020-07-21 ENCOUNTER — Other Ambulatory Visit: Payer: Self-pay | Admitting: Family

## 2020-07-21 DIAGNOSIS — C9002 Multiple myeloma in relapse: Secondary | ICD-10-CM

## 2020-07-21 NOTE — Telephone Encounter (Signed)
Lauren, Please consider refill. Dr. Senaida Lange patient.

## 2020-07-22 NOTE — Telephone Encounter (Signed)
Please schedule F/U appointment. Patient did not show for last scheduled appointment. Thank you! °

## 2020-07-22 NOTE — Telephone Encounter (Signed)
Called and unable to lvm

## 2020-07-23 NOTE — Telephone Encounter (Signed)
FYI, Dr.B's last note says "  Defer to endocrinology for further refills of methimazole".

## 2020-07-29 NOTE — Telephone Encounter (Signed)
Unable to leave vm.

## 2020-07-30 ENCOUNTER — Other Ambulatory Visit: Payer: Self-pay | Admitting: Internal Medicine

## 2020-07-30 DIAGNOSIS — J209 Acute bronchitis, unspecified: Secondary | ICD-10-CM

## 2020-07-31 NOTE — Telephone Encounter (Signed)
Brandi [name], should this be deferred to patient's pulmonologist or pcp?

## 2020-08-14 NOTE — Telephone Encounter (Signed)
Spoke with daughter, states she will have her call us

## 2020-08-15 ENCOUNTER — Other Ambulatory Visit: Payer: Self-pay

## 2020-08-15 ENCOUNTER — Ambulatory Visit (INDEPENDENT_AMBULATORY_CARE_PROVIDER_SITE_OTHER): Payer: Medicare Other | Admitting: Physician Assistant

## 2020-08-15 ENCOUNTER — Encounter: Payer: Self-pay | Admitting: Physician Assistant

## 2020-08-15 VITALS — BP 128/62 | HR 57 | Ht 62.0 in | Wt 139.0 lb

## 2020-08-15 DIAGNOSIS — J449 Chronic obstructive pulmonary disease, unspecified: Secondary | ICD-10-CM

## 2020-08-15 DIAGNOSIS — I5032 Chronic diastolic (congestive) heart failure: Secondary | ICD-10-CM | POA: Diagnosis not present

## 2020-08-15 DIAGNOSIS — E854 Organ-limited amyloidosis: Secondary | ICD-10-CM

## 2020-08-15 DIAGNOSIS — I43 Cardiomyopathy in diseases classified elsewhere: Secondary | ICD-10-CM

## 2020-08-15 DIAGNOSIS — I10 Essential (primary) hypertension: Secondary | ICD-10-CM | POA: Diagnosis not present

## 2020-08-15 DIAGNOSIS — E042 Nontoxic multinodular goiter: Secondary | ICD-10-CM | POA: Diagnosis not present

## 2020-08-15 DIAGNOSIS — E0590 Thyrotoxicosis, unspecified without thyrotoxic crisis or storm: Secondary | ICD-10-CM

## 2020-08-15 DIAGNOSIS — C9000 Multiple myeloma not having achieved remission: Secondary | ICD-10-CM

## 2020-08-15 NOTE — Patient Instructions (Signed)
Medication Instructions:  Your physician recommends that you continue on your current medications as directed. Please refer to the Current Medication list given to you today.  *If you need a refill on your cardiac medications before your next appointment, please call your pharmacy*  Follow-Up: At [facility], you and your health needs are our priority.  As part of our continuing mission to provide you with exceptional heart care, we have created designated Provider Care Teams.  These Care Teams include your primary Cardiologist (physician) and Advanced Practice Providers (APPs -  Physician Assistants and Nurse Practitioners) who all work together to provide you with the care you need, when you need it.  We recommend signing up for the patient portal called "MyChart".  Sign up information is provided on this After Visit Summary.  MyChart is used to connect with patients for Virtual Visits (Telemedicine).  Patients are able to view lab/test results, encounter notes, upcoming appointments, etc.  Non-urgent messages can be sent to your provider as well.   To learn more about what you can do with MyChart, go to [email or web address].com.    Your next appointment:   6 month(s)  The format for your next appointment:   In Person  Provider:   You may see Christopher End, MD or one of the following Advanced Practice Providers on your designated Care Team:    Christopher Berge, NP  Ryan Dunn, PA-C  Jacquelyn Visser, PA-C  Cadence Furth, PA-C  Caitlin Walker, NP  

## 2020-08-15 NOTE — Telephone Encounter (Signed)
Scheduled but unable to close encounter as rx pending

## 2020-08-15 NOTE — Telephone Encounter (Signed)
Patient here for appointment with Brandi [name] today. Will you please refill if appropriate? Thanks!

## 2020-08-15 NOTE — Progress Notes (Signed)
Office Visit    Patient Name: Brandi [name] Date of Encounter: 08/15/2020  Primary Care Provider:  [facility] [place] Primary Cardiologist:  Nelva Bush, MD  Chief Complaint    Chief Complaint  Patient presents with  . Follow-up    3 Months follow up and c/o rapid heart beat sometimes. Medications verbally reviewed with patient.    70 year old female with history of hyperthyroidism with multinodular goiter on methimazole, multiple myeloma diagnosed in 2018 s/p chemo and autologous stem cell transplant 05/2018 maintenance Revlimid therapy, HFpEF, hypertension, COPD, former tobacco use, and who presents today for follow-up of HFpEF.  Past Medical History    Past Medical History:  Diagnosis Date  . Anxiety   . At risk for cardiac dysfunction   . Hypertension   . Hypokalemia   . IgA myeloma ([place])   . Multiple myeloma ([place])   . Red blood cell antibody positive, compatible PRBC difficult to obtain   . S/P autologous bone marrow transplantation [facility])    Past Surgical History:  Procedure Laterality Date  . ESOPHAGOGASTRODUODENOSCOPY (EGD) WITH PROPOFOL N/A 03/30/2019   Procedure: ESOPHAGOGASTRODUODENOSCOPY (EGD) WITH PROPOFOL;  Surgeon: Jonathon Bellows, MD;  Location: [facility] ENDOSCOPY;  Service: Gastroenterology;  Laterality: N/A;  . IR FLUORO GUIDE PORT INSERTION RIGHT  12/16/2017    Allergies  No Known Allergies  History of Present Illness    Brandi [name] is a 70 y.o. female with PMH as above. She has history of hyperthyroidism with multinodular goiter, multiple myeloma, diagnosed in 2018 s/p chemo and autologous stem cell transplant (05/2018) maintenance Revlimid therapy, HTN, LEE, DOE, COPD, and former tobacco use.   Seen by Dr. Rogue Bussing in February 2021 with LEE, DOE, and cough. Revlimid was held in this setting. TTE 07/2019 showed preserved LVEF and nl diastolic function. PASP at least moderately elevated. It was noted during a previous myeloma  treatment, she had an IJ DVT tx briefly with Xarelto.  Seen by Dr. Saunders Revel 09/26/19 with report of 2-3 months of worsening LEE and DOE consistent with NYHA IIIb HF. Elevated BNP. Lasix, previous started by Dr. Rogue Bussing, was increased. Amlodipine was decreased. Metoprolol added.  Seen 10/2019 and spironolactone added and pt provided HF education as noted to be eating high Na foods. Seen again 12/14/19 and amlodipine discontinued due to hypotension. Seen 01/2020 and noted some edema while eating some high Na foods. She was taking her diuretic inconsistently. It was requested she take her diuretic daily. Her spironolactone was increased to 46m daily.   Last seen in office 02/27/20 by CLaurann Montana NP. At that time, she reported feeling well overall. Wt was down 9 lbs from her clinic wt. She reported compliance with Lasix and spironolactone. She did report eating some salt, including tomato sandwiches. No formal exercise routine was reported. She was walking to her mailbox daily. She had continued DOE, improved with nebulizer tx 4x per day. Recommendations from oncologist were reviewed with request for referral to pulmonology.   Today, 08/15/20, she returns to clinic and notes that she feels she is doing well from a cardiac standpoint. She denies CP,  palpitations, presyncope, or syncope. DOE and racing HR has significantly improved since starting to treat her hyperthyroidism. She is now followed closely by endocrinology / Dr. SGabriel Carina She is taking methimazole for her thyroid. She still occasionally notes her racing HR. She occasionally gets bilateral LEE but never in amounts that are noticeable or concerning to her. She is now taking her lasix  every other day (reduced by pt). She is using two pillows to sleep for shortness of breath, but she was told that this SOB when laying flat is likely due to to the size of her swelling associated with her multinodular goiter as noted on exam. She notes she is working with PT to  increase her tolerance of activity, as she still experiences some SOB with activity. On review of endocrinology note, [place] 06/16/20 showed multinodular goiter. No PND or early satiety. Since treating her thyroid, she has noted an increase in her appetite and slowly has been able to gain weight. She is joined today by her daughter, who also notes that the patient is overall doing well since her last visit.   Home Medications    Current Outpatient Medications on File Prior to Visit  Medication Sig Dispense Refill  . acyclovir (ZOVIRAX) 400 MG tablet TAKE 1 TABLET BY MOUTH ONCE DAILY TO PREVENT SHINGLES 30 tablet 0  . albuterol (VENTOLIN HFA) 108 (90 Base) MCG/ACT inhaler Inhale 2 puffs into the lungs every 6 (six) hours as needed for wheezing or shortness of breath. 8 g 3  . aspirin EC 81 MG EC tablet Take 1 tablet (81 mg total) by mouth daily. 90 tablet 3  . ergocalciferol (VITAMIN D2) 1.25 MG (50000 UT) capsule Take 1 capsule (50,000 Units total) by mouth once a week. 12 capsule 2  . feeding supplement, ENSURE ENLIVE, (ENSURE ENLIVE) LIQD Take 237 mLs by mouth 2 (two) times daily between meals. 60 Bottle 0  . Fluticasone-Salmeterol (ADVAIR DISKUS) 500-50 MCG/DOSE AEPB Inhale 1 puff into the lungs 2 (two) times daily. 1 each 3  . furosemide (LASIX) 40 MG tablet Take 1 tablet (40 mg total) by mouth daily. 90 tablet 3  . guaiFENesin-codeine 100-10 MG/5ML syrup TAKE 5 ML BY MOUTH  THREE TIMES DAILY AS NEEDED FOR COUGH. MAY CAUSE DROWSINESS. DO NOT DRIVE WITH THIS MEDICATION 120 mL 0  . Ipratropium-Albuterol (COMBIVENT) 20-100 MCG/ACT AERS respimat Inhale into the lungs.    [name] ipratropium-albuterol (DUONEB) 0.5-2.5 (3) MG/3ML SOLN Take 3 mLs by nebulization every 4 (four) hours as needed. 360 mL 3  . megestrol (MEGACE ES) 625 MG/5ML suspension Take 5 mLs (625 mg total) by mouth daily. 150 mL 0  . methimazole (TAPAZOLE) 10 MG tablet Take 2 tablets by mouth once daily 60 tablet 0  . metoprolol succinate  (TOPROL-XL) 25 MG 24 hr tablet TAKE 1 & 1/2 (ONE & ONE-HALF) TABLETS BY MOUTH ONCE DAILY 45 tablet 0  . mirtazapine (REMERON) 15 MG tablet Take 1 tablet (15 mg total) by mouth at bedtime. 30 tablet 4  . montelukast (SINGULAIR) 10 MG tablet Take 1 tablet (10 mg total) by mouth at bedtime. 30 tablet 3  . pantoprazole (PROTONIX) 40 MG tablet Take 1 tablet by mouth once daily 90 tablet 0  . polyethylene glycol (MIRALAX / GLYCOLAX) packet Take 17 g by mouth daily as needed for mild constipation. 14 each 0  . potassium chloride SA (KLOR-CON) 20 MEQ tablet Take 1 tablet (20 mEq total) by mouth 2 (two) times daily. 60 tablet 6  . spironolactone (ALDACTONE) 25 MG tablet Take 1 tablet (25 mg total) by mouth daily. 90 tablet 3   Current Facility-Administered Medications on File Prior to Visit  Medication Dose Route Frequency Provider Last Rate Last Admin  . 0.9 %  sodium chloride infusion   Intravenous Continuous Monia Sabal, PA-C      . sodium chloride flush (NS) 0.9 %  injection 10 mL  10 mL Intracatheter PRN [name], MD   10 mL at 02/23/18 1400    Review of Systems    She denies chest pain, pnd, n, v, dizziness, syncope, weight gain, or early satiety. She reports occasional racing HR and DOE but significantly improved since starting tx for her thyroid. She reports SOB when laying flat so uses 2 pillows, but this is attributed to her neck mass / goiter interfering with her trachea / movement of air. Some SOB with activity but working with PT to increase tolerance. She occasionally gets some LEE but never to amounts that are noticeable to her.   All other systems reviewed and are otherwise negative except as noted above.  Physical Exam    VS:  BP 128/62 (BP Location: Left Arm, Patient Position: Sitting, Cuff Size: Normal)   Pulse (!) 57   Ht '5\' 2"'  (1.575 m)   Wt 139 lb (63 kg)   SpO2 98%   BMI 25.42 kg/m  , BMI Body mass index is 25.42 kg/m. GEN: Well nourished, well developed, in  no acute distress. HEENT: normal. Neck: no JVD, carotid bruits. Large goiter noted with bilateral swelling present on both sides of neck Cardiac: bradycardic but regular, no murmurs, rubs, or gallops. No clubbing, cyanosis. Trace to no bilateral edema.  Radials/DP/PT 2+ and equal bilaterally.  Respiratory:  Respirations regular and unlabored, clear to auscultation bilaterally. GI: Soft, nontender, nondistended, BS + x 4. MS: no deformity or atrophy. Skin: warm and dry, no rash. Neuro:  Strength and sensation are intact. Psych: Normal affect.  Accessory Clinical Findings    ECG personally reviewed by me today - SB, LVH, 57bpm - no acute changes.  VITALS Reviewed today   Temp Readings from Last 3 Encounters:  06/25/20 98.5 F (36.9 C) (Tympanic)  05/28/20 99.1 F (37.3 C) (Tympanic)  05/24/20 97.8 F (36.6 C)   BP Readings from Last 3 Encounters:  08/15/20 128/62  06/25/20 133/71  05/28/20 (!) 119/59   Pulse Readings from Last 3 Encounters:  08/15/20 (!) 57  06/25/20 (!) 101  05/28/20 (!) 56    Wt Readings from Last 3 Encounters:  08/15/20 139 lb (63 kg)  06/25/20 135 lb (61.2 kg)  05/28/20 120 lb 8 oz (54.7 kg)     LABS  reviewed today   Lab Results  Component Value Date   WBC 6.3 06/25/2020   HGB 10.0 (L) 06/25/2020   HCT 32.2 (L) 06/25/2020   MCV 85.2 06/25/2020   PLT 246 06/25/2020   Lab Results  Component Value Date   CREATININE 0.45 06/25/2020   BUN 17 06/25/2020   NA 140 06/25/2020   K 4.1 06/25/2020   CL 109 06/25/2020   CO2 21 (L) 06/25/2020   Lab Results  Component Value Date   ALT 18 06/25/2020   AST 26 06/25/2020   GGT 143 (H) 03/26/2019   ALKPHOS 178 (H) 06/25/2020   BILITOT 0.5 06/25/2020   No results found for: CHOL, HDL, LDLCALC, LDLDIRECT, TRIG, CHOLHDL  No results found for: HGBA1C Lab Results  Component Value Date   TSH <0.005 (L) 05/28/2020     STUDIES/PROCEDURES reviewed today   Echo 08/20/19 1. Left ventricular ejection  fraction, by visual estimation, is 60 to  65%. The left ventricle has normal function. There is no left ventricular  hypertrophy.  2. The left ventricle has no regional wall motion abnormalities.  3. Global right ventricle has normal systolic function.The right  ventricular size is normal. No increase in right ventricular wall  thickness.  4. Left atrial size was normal.  5. Right atrial size was normal.  6. The mitral valve is normal in structure. Trivial mitral valve  regurgitation.  7. The tricuspid valve is normal in structure.  8. The tricuspid valve is normal in structure. Tricuspid valve  regurgitation is trivial.  9. The aortic valve is normal in structure. Aortic valve regurgitation is  trivial.  10. The pulmonic valve was normal in structure. Pulmonic valve  regurgitation is not visualized.  11. Moderately elevated pulmonary artery systolic pressure.   Assessment & Plan    HFpEF --Euvolemic on exam. Echo 08/20/19 with LVEF 60-65%, no LVH, no RWMA, trivial MR. Wt increased from previous clinic visits but attributed to caloric intake and not volume. She reports 2 pillows at night as SOB when laying flat but attributes the SOB due to obstruction caused by her multinodular goiter. She has been taking her lasix every other day now and reports some dependent edema that is not concerning to her. She has some DOE with activity that is improving with PT. NYHA class II. Discussed recommendation to increase lasix back to daily if ever she notes more s/sx of volume overload or if gaining more than 3 lbs overnight or 5 lbs in a week with pt understanding. Continue BB, lasix, spironolactone, potassium.  Hypertension --BP well controlled today and reported as well controlled at home. Continue current lasix, spironolactone, and Toprol. As above, recommendation was to increase back to lasix every day if BP elevated with s/sx of volume overload / wt gain with both patient and daughter  understanding.  Multiple myeloma / anemia --Continue to follow with oncology.  COPD --Likely contributes to [place]. Continues to follow with Dr. Lanney Gins.  GERD --Continue PPI.   Multinodular goiter, hyperthyroidism --Likely that thyroid abnormalities were contributing to palpitations in the past. Reports that goiter causes SOB when laying flat due to obstruction. Recommended that she still closely follow her volume status and call the office if ever this worsens or she feels she may be holding on to fluids, as well as if any tachypalpitations.    Arvil Chaco, PA-C 08/15/2020

## 2020-08-28 ENCOUNTER — Other Ambulatory Visit: Payer: Self-pay

## 2020-08-28 DIAGNOSIS — C9002 Multiple myeloma in relapse: Secondary | ICD-10-CM

## 2020-08-28 DIAGNOSIS — J209 Acute bronchitis, unspecified: Secondary | ICD-10-CM

## 2020-08-29 ENCOUNTER — Inpatient Hospital Stay: Payer: Medicare Other | Attending: Internal Medicine

## 2020-08-29 ENCOUNTER — Inpatient Hospital Stay: Payer: Medicare Other

## 2020-08-29 ENCOUNTER — Other Ambulatory Visit: Payer: Self-pay | Admitting: *Deleted

## 2020-08-29 ENCOUNTER — Encounter: Payer: Self-pay | Admitting: Internal Medicine

## 2020-08-29 ENCOUNTER — Inpatient Hospital Stay (HOSPITAL_BASED_OUTPATIENT_CLINIC_OR_DEPARTMENT_OTHER): Payer: Medicare Other | Admitting: Internal Medicine

## 2020-08-29 DIAGNOSIS — C9002 Multiple myeloma in relapse: Secondary | ICD-10-CM | POA: Diagnosis not present

## 2020-08-29 DIAGNOSIS — C9000 Multiple myeloma not having achieved remission: Secondary | ICD-10-CM

## 2020-08-29 DIAGNOSIS — E0590 Thyrotoxicosis, unspecified without thyrotoxic crisis or storm: Secondary | ICD-10-CM | POA: Diagnosis not present

## 2020-08-29 DIAGNOSIS — Z23 Encounter for immunization: Secondary | ICD-10-CM | POA: Diagnosis not present

## 2020-08-29 DIAGNOSIS — J209 Acute bronchitis, unspecified: Secondary | ICD-10-CM

## 2020-08-29 LAB — COMPREHENSIVE METABOLIC PANEL
ALT: 10 U/L (ref 0–44)
AST: 16 U/L (ref 15–41)
Albumin: 3.8 g/dL (ref 3.5–5.0)
Alkaline Phosphatase: 211 U/L — ABNORMAL HIGH (ref 38–126)
Anion gap: 9 (ref 5–15)
BUN: 12 mg/dL (ref 8–23)
CO2: 20 mmol/L — ABNORMAL LOW (ref 22–32)
Calcium: 9.2 mg/dL (ref 8.9–10.3)
Chloride: 110 mmol/L (ref 98–111)
Creatinine, Ser: 0.59 mg/dL (ref 0.44–1.00)
GFR, Estimated: >60 mL/min (ref >60)
Glucose, Bld: 89 mg/dL (ref 70–99)
Potassium: 4.2 mmol/L (ref 3.5–5.1)
Sodium: 139 mmol/L (ref 135–145)
Total Bilirubin: 0.3 mg/dL (ref 0.3–1.2)
Total Protein: 7.6 g/dL (ref 6.5–8.1)

## 2020-08-29 LAB — CBC WITH DIFFERENTIAL/PLATELET
Abs Immature Granulocytes: 0.01 10*3/uL (ref 0.00–0.07)
Basophils Absolute: 0.1 10*3/uL (ref 0.0–0.1)
Basophils Relative: 1 %
Eosinophils Absolute: 0.5 10*3/uL (ref 0.0–0.5)
Eosinophils Relative: 7 %
HCT: 33.1 % — ABNORMAL LOW (ref 36.0–46.0)
Hemoglobin: 10.5 g/dL — ABNORMAL LOW (ref 12.0–15.0)
Immature Granulocytes: 0 %
Lymphocytes Relative: 49 %
Lymphs Abs: 3 10*3/uL (ref 0.7–4.0)
MCH: 26.9 pg (ref 26.0–34.0)
MCHC: 31.7 g/dL (ref 30.0–36.0)
MCV: 84.9 fL (ref 80.0–100.0)
Monocytes Absolute: 0.4 10*3/uL (ref 0.1–1.0)
Monocytes Relative: 7 %
Neutro Abs: 2.2 10*3/uL (ref 1.7–7.7)
Neutrophils Relative %: 36 %
Platelets: 208 10*3/uL (ref 150–400)
RBC: 3.9 MIL/uL (ref 3.87–5.11)
RDW: 13.5 % (ref 11.5–15.5)
WBC: 6.2 10*3/uL (ref 4.0–10.5)
nRBC: 0 % (ref 0.0–0.2)

## 2020-08-29 MED ORDER — INFLUENZA VAC A&B SA ADJ QUAD 0.5 ML IM PRSY
0.5000 mL | PREFILLED_SYRINGE | Freq: Once | INTRAMUSCULAR | Status: AC
  Administered 2020-08-29: 0.5 mL via INTRAMUSCULAR
  Filled 2020-08-29: qty 0.5

## 2020-08-29 NOTE — Progress Notes (Signed)
[place] OFFICE PROGRESS NOTE  Patient Care Team: [facility], [place] as PCP - General (General Practice) End, Harrell Gave, MD as PCP - Cardiology (Cardiology) [name], [name] [name], MD as Consulting Physician (Hematology and Oncology) Ottie Glazier, MD as Consulting Physician (Pulmonary Disease) Gabriel Carina Betsey Holiday, MD as Consulting Physician (Endocrinology)  Cancer Staging No matching staging information was found for the patient.   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 No morphologic support for residual myeloma disease. Negative for minimal residual disease by MM-MRD flow cytometry; HOLD REVLIMID [leg swelling]; AUG 2021-PET scan negative for myeloma; continue to hold Revlimid  # MARCH 2021-diastolic congestive heart failure [Dr.End]  # BMBx- OCT 2021- BMBx- 5% plasmacytosis-however this appears to be more polyclonal rather than monoclonal-not explain patient worsening anemia.  #November 2021-hyperthyroidism/goiter-  [D.Bennett/Dr.Solum]-methimazole.  --------------------------  # 12/12- RIGHT JUGULAR DVT-x 325mn xarelto; finished April 2020; September 2020-EGD/dysphagia; Dr. AnVicente Males# Acute renal failure [Dr.Singh; Proteinuria 1.5gm/day ]; acyclovir/Asprin ------------------------------------------------------------------------------------------------------------   DIAGNOSIS: _0  MULTIPLE MYELOMA  STAGE: III/HIGH RISK ;GOALS: CONTROL  CURRENT/MOST RECENT THERAPY-maintenance Revlimid- on HOLD    [place] -  Chemotherapy   The patient had dexamethasone (DECADRON) 4 MG tablet, 1 of 1 cycle, Start date: --, End date: -- palonosetron (ALOXI) injection 0.25 mg, 0.25 mg, Intravenous,  Once, 6 of 7 cycles Administration: 0.25 mg (11/21/2017), 0.25 mg (11/29/2017), 0.25 mg (12/05/2017), 0.25 mg (12/21/2017), 0.25 mg (12/28/2017), 0.25 mg (01/04/2018), 0.25 mg (01/18/2018), 0.25 mg (01/25/2018), 0.25 mg (02/01/2018), 0.25 mg (02/15/2018), 0.25 mg (02/22/2018), 0.25 mg (03/01/2018), 0.25 mg (03/15/2018), 0.25 mg (03/22/2018), 0.25 mg (03/30/2018), 0.25 mg (04/13/2018), 0.25 mg (04/20/2018), 0.25 mg (04/27/2018) cyclophosphamide (CYTOXAN) 500 mg in sodium chloride 0.9 % 250 mL chemo infusion, 540 mg, Intravenous,  Once, 6 of 7 cycles Administration: 500 mg (11/21/2017), 500 mg (11/29/2017), 500 mg (12/05/2017), 500 mg (12/28/2017), 500 mg (01/04/2018), 500 mg (01/18/2018), 500 mg (01/25/2018), 500 mg (02/01/2018), 500 mg (02/15/2018), 500 mg (02/22/2018), 500 mg (03/01/2018), 500 mg (03/15/2018), 500 mg (03/30/2018), 500 mg (04/13/2018), 500 mg (04/20/2018), 500 mg (04/27/2018) carfilzomib (KYPROLIS) 36 mg in dextrose 5 % 50 mL chemo infusion, 20 mg/m2 = 36 mg, Intravenous, Once, 6 of 7 cycles Administration: 36 mg (11/21/2017), 36 mg (11/22/2017), 60 mg (11/29/2017), 60 mg (11/30/2017), 60 mg (12/05/2017), 60 mg (12/06/2017), 60 mg (12/21/2017), 60 mg (12/22/2017), 60 mg (12/28/2017), 60 mg (12/29/2017), 60 mg (01/04/2018), 60 mg (01/05/2018), 60 mg  (01/18/2018), 60 mg (01/19/2018), 60 mg (01/25/2018), 60 mg (01/27/2018), 60 mg (02/01/2018), 60 mg (02/02/2018), 60 mg (02/15/2018), 60 mg (02/16/2018), 60 mg (02/22/2018), 60 mg (03/01/2018), 60 mg (03/02/2018), 60 mg (03/15/2018), 60 mg (03/16/2018), 60 mg (03/23/2018), 60 mg (03/30/2018), 60  mg (03/31/2018), 60 mg (04/13/2018), 60 mg (04/14/2018), 60 mg (04/20/2018), 60 mg (04/21/2018), 60 mg (04/27/2018)  for chemotherapy treatment.     INTERVAL HISTORY:  Brandi [name] 70 y.o.  female pleasant patient above history of relapsed multiple myeloma--most recently not on maintenance Revlimid [on since Feb 2021] is here for follow-up.  Patient is followed closely by endocrinology for her severe hyperthyroidism/goiter.  Patient is currently on methimazole.  As per the patient she is being considered for radioactive iodine.  Otherwise appetite is good.  No weight loss.  States her breathing is improved.  Denies any anxiety spells.   Review of Systems  Constitutional: Positive for malaise/fatigue. Negative for chills, diaphoresis and fever.  HENT: Negative for nosebleeds and sore throat.   Eyes: Negative for double vision.  Respiratory: Positive for shortness of breath. Negative for hemoptysis.   Cardiovascular: Negative for chest pain, palpitations and orthopnea.  Gastrointestinal: Negative for abdominal pain, blood in stool, constipation, heartburn and melena.  Genitourinary: Negative for dysuria, frequency and urgency.  Musculoskeletal: Positive for back pain and myalgias.  Skin: Negative.  Negative for itching and rash.  Neurological: Negative for dizziness, tingling, focal weakness, weakness and headaches.  Endo/Heme/Allergies: Does not bruise/bleed easily.  Psychiatric/Behavioral: Negative for depression. The patient has insomnia. The patient is not nervous/anxious.     PAST MEDICAL HISTORY :  Past Medical History:  Diagnosis Date  . Anxiety   . At risk for cardiac dysfunction   . Hypertension   .  Hypokalemia   . IgA myeloma ([place])   . Multiple myeloma ([place])   . Red blood cell antibody positive, compatible PRBC difficult to obtain   . S/P autologous bone marrow transplantation ([place])     PAST SURGICAL HISTORY :   Past Surgical History:  Procedure Laterality Date  . ESOPHAGOGASTRODUODENOSCOPY (EGD) WITH PROPOFOL N/A 03/30/2019   Procedure: ESOPHAGOGASTRODUODENOSCOPY (EGD) WITH PROPOFOL;  Surgeon: Jonathon Bellows, MD;  Location[facility] ENDOSCOPY;  Service: Gastroenterology;  Laterality: N/A;  . IR FLUORO GUIDE PORT INSERTION RIGHT  12/16/2017    FAMILY HISTORY :   Family History  Problem Relation Age of Onset  . Pneumonia Mother   . Seizures Father     SOCIAL HISTORY:   Social History   Tobacco Use  . Smoking status: Current Every Day Smoker    Packs/day: 0.50    Types: Cigarettes  . Smokeless tobacco: Never Used  . Tobacco comment: 2 cigarettes QOD  Vaping Use  . Vaping Use: Never used  Substance Use Topics  . Alcohol use: No  . Drug use: No    ALLERGIES:  has No Known Allergies.  MEDICATIONS:  Current Outpatient Medications  Medication Sig Dispense Refill  . acyclovir (ZOVIRAX) 400 MG tablet TAKE 1 TABLET BY MOUTH ONCE DAILY TO PREVENT SHINGLES 30 tablet 0  . albuterol (VENTOLIN HFA) 108 (90 Base) MCG/ACT inhaler Inhale 2 puffs into the lungs every 6 (six) hours as needed for wheezing or shortness of breath. 8 g 3  . aspirin EC 81 MG EC tablet Take 1 tablet (81 mg total) by mouth daily. 90 tablet 3  . ergocalciferol (VITAMIN D2) 1.25 MG (50000 UT) capsule Take 1 capsule (50,000 Units total) by mouth once a week. 12 capsule 2  . feeding supplement, ENSURE ENLIVE, (ENSURE ENLIVE) LIQD Take 237 mLs by mouth 2 (two) times daily between meals. 60 Bottle 0  . Fluticasone-Salmeterol (ADVAIR DISKUS) 500-50 MCG/DOSE AEPB Inhale 1 puff into the lungs 2 (two) times daily.  1 each 3  . furosemide (LASIX) 40 MG tablet Take 1 tablet (40 mg total) by mouth daily. 90 tablet 3  .  guaiFENesin-codeine 100-10 MG/5ML syrup TAKE 5 ML BY MOUTH  THREE TIMES DAILY AS NEEDED FOR COUGH. MAY CAUSE DROWSINESS. DO NOT DRIVE WITH THIS MEDICATION 120 mL 0  . Ipratropium-Albuterol (COMBIVENT) 20-100 MCG/ACT AERS respimat Inhale into the lungs.    [name] ipratropium-albuterol (DUONEB) 0.5-2.5 (3) MG/3ML SOLN Take 3 mLs by nebulization every 4 (four) hours as needed. 360 mL 3  . megestrol (MEGACE ES) 625 MG/5ML suspension Take 5 mLs (625 mg total) by mouth daily. 150 mL 0  . methimazole (TAPAZOLE) 10 MG tablet Take 2 tablets by mouth once daily 60 tablet 0  . metoprolol succinate (TOPROL-XL) 25 MG 24 hr tablet TAKE ONE AND ONE-HALF TABLETS BY MOUTH ONCE DAILY 45 tablet 1  . mirtazapine (REMERON) 15 MG tablet Take 1 tablet (15 mg total) by mouth at bedtime. 30 tablet 4  . montelukast (SINGULAIR) 10 MG tablet Take 1 tablet (10 mg total) by mouth at bedtime. 30 tablet 3  . pantoprazole (PROTONIX) 40 MG tablet Take 1 tablet by mouth once daily 90 tablet 0  . polyethylene glycol (MIRALAX / GLYCOLAX) packet Take 17 g by mouth daily as needed for mild constipation. 14 each 0  . potassium chloride SA (KLOR-CON) 20 MEQ tablet Take 1 tablet (20 mEq total) by mouth 2 (two) times daily. 60 tablet 6  . spironolactone (ALDACTONE) 25 MG tablet Take 1 tablet (25 mg total) by mouth daily. 90 tablet 1   No current facility-administered medications for this visit.   Facility-Administered Medications Ordered in Other Visits  Medication Dose Route Frequency Provider Last Rate Last Admin  . 0.9 %  sodium chloride infusion   Intravenous Continuous Monia Sabal, PA-C      . influenza vaccine adjuvanted (FLUAD) injection 0.5 mL  0.5 mL Intramuscular Once Charlaine Dalton R, MD      . sodium chloride flush (NS) 0.9 % injection 10 mL  10 mL Intracatheter PRN [name], MD   10 mL at 02/23/18 1400    PHYSICAL EXAMINATION: ECOG PERFORMANCE STATUS: 1 - Symptomatic but completely ambulatory  BP 133/63  (BP Location: Right Arm, Patient Position: Sitting, Cuff Size: Normal)   Pulse 64   Temp 97.6 F (36.4 C) (Tympanic)   Resp 16   Ht _0  (1.575 m)   Wt 138 lb (62.6 kg)   SpO2 100%   BMI 25.24 kg/m   Filed Weights   08/29/20 1311  Weight: 138 lb (62.6 kg)    Physical Exam Constitutional:      Comments: patient in a wheelchair.  She is alone.  She is on 2 L nasal cannula.  HENT:     Head: Normocephalic and atraumatic.  Eyes:     Pupils: Pupils are equal, round, and reactive to light.  Neck:     Comments: Goiter noted. Cardiovascular:     Rate and Rhythm: Normal rate and regular rhythm.  Pulmonary:     Effort: Pulmonary effort is normal. No respiratory distress.     Breath sounds: Normal breath sounds. No wheezing.  Abdominal:     General: Bowel sounds are normal. There is no distension.     Palpations: Abdomen is soft. There is no mass.     Tenderness: There is no abdominal tenderness. There is no guarding or rebound.  Musculoskeletal:        General:  No tenderness. Normal range of motion.     Cervical back: Normal range of motion and neck supple.  Skin:    General: Skin is warm.  Neurological:     Mental Status: She is alert and oriented to person, place, and time.  Psychiatric:        Mood and Affect: Affect normal.    LABORATORY DATA:  I have reviewed the data as listed    Component Value Date/Time   NA 139 08/29/2020 1256   NA 141 01/25/2020 1530   K 4.2 08/29/2020 1256   CL 110 08/29/2020 1256   CO2 20 (L) 08/29/2020 1256   GLUCOSE 89 08/29/2020 1256   BUN 12 08/29/2020 1256   BUN 19 01/25/2020 1530   CREATININE 0.59 08/29/2020 1256   CALCIUM 9.2 08/29/2020 1256   PROT 7.6 08/29/2020 1256   ALBUMIN 3.8 08/29/2020 1256   AST 16 08/29/2020 1256   ALT 10 08/29/2020 1256   ALKPHOS 211 (H) 08/29/2020 1256   BILITOT 0.3 08/29/2020 1256   GFRNONAA >60 08/29/2020 1256   GFRAA NOT CALCULATED 04/17/2020 1352    No results found for: SPEP, UPEP  Lab  Results  Component Value Date   WBC 6.2 08/29/2020   NEUTROABS 2.2 08/29/2020   HGB 10.5 (L) 08/29/2020   HCT 33.1 (L) 08/29/2020   MCV 84.9 08/29/2020   PLT 208 08/29/2020      Chemistry      Component Value Date/Time   NA 139 08/29/2020 1256   NA 141 01/25/2020 1530   K 4.2 08/29/2020 1256   CL 110 08/29/2020 1256   CO2 20 (L) 08/29/2020 1256   BUN 12 08/29/2020 1256   BUN 19 01/25/2020 1530   CREATININE 0.59 08/29/2020 1256      Component Value Date/Time   CALCIUM 9.2 08/29/2020 1256   ALKPHOS 211 (H) 08/29/2020 1256   AST 16 08/29/2020 1256   ALT 10 08/29/2020 1256   BILITOT 0.3 08/29/2020 1256       RADIOGRAPHIC STUDIES: I have personally reviewed the radiological images as listed and agreed with the findings in the report. No results found.   ASSESSMENT & PLAN:  IgA myeloma ([place]) #Multiple myeloma stage III high-risk cytogenetics-status post KRD-VGPR ; status post autologousstem cell transplant on 06/15/18. FEB 2021- [facility]- NEGATIVE [[facility],Dr.G on 11/22/2019]. PET scan-August 2021-no evidence of any recurrent myeloma. SEP 2021 Myeloma panel; kappa lambda light chain ratio -negative/normal. MM- from today pending.   #Continue hold patient's maintenance Revlimid at this time given ongoing multiple medical problems; consideration for radioactive iodine infusion/for hyperthyroidism.   #Hyperthyroidism/goiter-currently on methimazole as per Dr. Gabriel Carina; endocrinology; awaiting RAIU in 62monthor so. ? Thyroidectomy- defer to endocrine.   # chronic respiratory failure- sec to COPD/diastolic CHF on diuretics nebulizer home O2; continue follow-up with pulmonary; Dr.A & cardiology-STABLE..  # Bone lesions /last zometa on 11/27/2017.  Holding Zometa secondary to poor dentition; OK to proceed with dental extraction.   # Flu vaccination: Interested; will plan flu shot today.  Also recommend booster for COVID.  # DISPOSITION:  # No zometa # Follow up 2 month- MD;  labs-cbc//cmp;MM panel; K/L light chains;Dr.B     No orders of the defined types were placed in this encounter.  All questions were answered. The patient knows to call the clinic with any problems, questions or concerns.      GCammie Sickle MD 08/29/2020 1:45 PM

## 2020-08-29 NOTE — Progress Notes (Signed)
Want to know if it would be ok to have her teeth pulled to get dentures.

## 2020-08-29 NOTE — Assessment & Plan Note (Addendum)
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 FEB 2021- [facility]D- NEGATIVE [[facility],Dr.G on 11/22/2019]. PET scan-August 2021-no evidence of any recurrent myeloma. SEP 2021 Myeloma panel; kappa lambda light chain ratio -negative/normal. MM- from today pending.   #Continue hold patient's maintenance Revlimid at this time given ongoing multiple medical problems; consideration for radioactive iodine infusion/for hyperthyroidism.   #Hyperthyroidism/goiter-currently on methimazole as per Dr. Gabriel Carina; endocrinology; awaiting RAIU in 63month or so. ? Thyroidectomy- defer to endocrine.   # chronic respiratory failure- sec to COPD/diastolic CHF on diuretics nebulizer home O2; continue follow-up with pulmonary; Dr.A & cardiology-STABLE..  # Bone lesions /last zometa on 11/27/2017.  Holding Zometa secondary to poor dentition; OK to proceed with dental extraction.   # Flu vaccination: Interested; will plan flu shot today.  Also recommend booster for COVID.  # DISPOSITION:  # No zometa # Follow up 2 month- MD; labs-cbc//cmp;MM panel; K/L light chains;Dr.B

## 2020-09-01 LAB — KAPPA/LAMBDA LIGHT CHAINS
Kappa free light chain: 14.7 mg/L (ref 3.3–19.4)
Kappa, lambda light chain ratio: 0.53 (ref 0.26–1.65)
Lambda free light chains: 27.7 mg/L — ABNORMAL HIGH (ref 5.7–26.3)

## 2020-09-01 LAB — MULTIPLE MYELOMA PANEL, SERUM
Albumin SerPl Elph-Mcnc: 3.9 g/dL (ref 2.9–4.4)
Albumin/Glob SerPl: 1.3 (ref 0.7–1.7)
Alpha 1: 0.1 g/dL (ref 0.0–0.4)
Alpha2 Glob SerPl Elph-Mcnc: 0.7 g/dL (ref 0.4–1.0)
B-Globulin SerPl Elph-Mcnc: 1.1 g/dL (ref 0.7–1.3)
Gamma Glob SerPl Elph-Mcnc: 1.2 g/dL (ref 0.4–1.8)
Globulin, Total: 3.1 g/dL (ref 2.2–3.9)
IgA: 325 mg/dL (ref 87–352)
IgG (Immunoglobin G), Serum: 1298 mg/dL (ref 586–1602)
IgM (Immunoglobulin M), Srm: 43 mg/dL (ref 26–217)
Total Protein ELP: 7 g/dL (ref 6.0–8.5)

## 2020-09-09 ENCOUNTER — Other Ambulatory Visit: Payer: Self-pay | Admitting: Internal Medicine

## 2020-09-09 DIAGNOSIS — E0500 Thyrotoxicosis with diffuse goiter without thyrotoxic crisis or storm: Secondary | ICD-10-CM

## 2020-09-17 ENCOUNTER — Other Ambulatory Visit: Payer: Medicare Other

## 2020-09-18 ENCOUNTER — Other Ambulatory Visit: Payer: Medicare Other

## 2020-10-06 ENCOUNTER — Encounter
Admission: RE | Admit: 2020-10-06 | Discharge: 2020-10-06 | Disposition: A | Discharge status: Home / Self Care | Payer: Medicare Other | Source: Ambulatory Visit | Attending: Internal Medicine | Admitting: Internal Medicine

## 2020-10-06 ENCOUNTER — Other Ambulatory Visit: Payer: Self-pay

## 2020-10-06 DIAGNOSIS — E0500 Thyrotoxicosis with diffuse goiter without thyrotoxic crisis or storm: Secondary | ICD-10-CM | POA: Diagnosis not present

## 2020-10-06 MED ORDER — SODIUM IODIDE I-123 7.4 MBQ CAPS
289.3540 | ORAL_CAPSULE | Freq: Once | ORAL | Status: AC
  Administered 2020-10-06: 289.354 via ORAL

## 2020-10-07 ENCOUNTER — Encounter
Admission: RE | Admit: 2020-10-07 | Discharge: 2020-10-07 | Disposition: A | Discharge status: Home / Self Care | Payer: Medicare Other | Source: Ambulatory Visit | Attending: Internal Medicine | Admitting: Internal Medicine

## 2020-10-07 ENCOUNTER — Other Ambulatory Visit: Payer: Self-pay

## 2020-10-07 ENCOUNTER — Other Ambulatory Visit: Payer: Self-pay | Admitting: *Deleted

## 2020-10-07 DIAGNOSIS — J209 Acute bronchitis, unspecified: Secondary | ICD-10-CM

## 2020-10-07 MED ORDER — GUAIFENESIN-CODEINE 100-10 MG/5ML PO SOLN: ORAL | 0 refills | Status: DC

## 2020-10-10 ENCOUNTER — Encounter: Payer: Self-pay | Admitting: Emergency Medicine

## 2020-10-10 ENCOUNTER — Inpatient Hospital Stay
Admission: EM | Admit: 2020-10-10 | Discharge: 2020-10-13 | DRG: 871 | Disposition: A | Discharge status: Home Health | Payer: Medicare Other | Attending: Internal Medicine | Admitting: Internal Medicine

## 2020-10-10 ENCOUNTER — Emergency Department: Payer: Medicare Other

## 2020-10-10 ENCOUNTER — Other Ambulatory Visit: Payer: Self-pay

## 2020-10-10 DIAGNOSIS — I499 Cardiac arrhythmia, unspecified: Secondary | ICD-10-CM | POA: Diagnosis not present

## 2020-10-10 DIAGNOSIS — A419 Sepsis, unspecified organism: Secondary | ICD-10-CM | POA: Diagnosis not present

## 2020-10-10 DIAGNOSIS — J181 Lobar pneumonia, unspecified organism: Secondary | ICD-10-CM | POA: Diagnosis present

## 2020-10-10 DIAGNOSIS — A403 Sepsis due to Streptococcus pneumoniae: Principal | ICD-10-CM | POA: Diagnosis present

## 2020-10-10 DIAGNOSIS — Z8589 Personal history of malignant neoplasm of other organs and systems: Secondary | ICD-10-CM | POA: Diagnosis not present

## 2020-10-10 DIAGNOSIS — I2510 Atherosclerotic heart disease of native coronary artery without angina pectoris: Secondary | ICD-10-CM | POA: Diagnosis present

## 2020-10-10 DIAGNOSIS — A409 Streptococcal sepsis, unspecified: Secondary | ICD-10-CM | POA: Diagnosis not present

## 2020-10-10 DIAGNOSIS — Z7982 Long term (current) use of aspirin: Secondary | ICD-10-CM

## 2020-10-10 DIAGNOSIS — I509 Heart failure, unspecified: Secondary | ICD-10-CM | POA: Diagnosis not present

## 2020-10-10 DIAGNOSIS — I5032 Chronic diastolic (congestive) heart failure: Secondary | ICD-10-CM | POA: Diagnosis present

## 2020-10-10 DIAGNOSIS — J9601 Acute respiratory failure with hypoxia: Secondary | ICD-10-CM | POA: Diagnosis present

## 2020-10-10 DIAGNOSIS — Z79899 Other long term (current) drug therapy: Secondary | ICD-10-CM

## 2020-10-10 DIAGNOSIS — L02416 Cutaneous abscess of left lower limb: Secondary | ICD-10-CM

## 2020-10-10 DIAGNOSIS — F17210 Nicotine dependence, cigarettes, uncomplicated: Secondary | ICD-10-CM | POA: Diagnosis present

## 2020-10-10 DIAGNOSIS — Z20822 Contact with and (suspected) exposure to covid-19: Secondary | ICD-10-CM | POA: Diagnosis present

## 2020-10-10 DIAGNOSIS — R6520 Severe sepsis without septic shock: Secondary | ICD-10-CM | POA: Diagnosis present

## 2020-10-10 DIAGNOSIS — C9000 Multiple myeloma not having achieved remission: Secondary | ICD-10-CM | POA: Diagnosis present

## 2020-10-10 DIAGNOSIS — J189 Pneumonia, unspecified organism: Secondary | ICD-10-CM | POA: Diagnosis present

## 2020-10-10 DIAGNOSIS — I110 Hypertensive heart disease with heart failure: Secondary | ICD-10-CM | POA: Diagnosis present

## 2020-10-10 DIAGNOSIS — L03116 Cellulitis of left lower limb: Secondary | ICD-10-CM

## 2020-10-10 DIAGNOSIS — Z9484 Stem cells transplant status: Secondary | ICD-10-CM | POA: Diagnosis not present

## 2020-10-10 DIAGNOSIS — Z7951 Long term (current) use of inhaled steroids: Secondary | ICD-10-CM

## 2020-10-10 DIAGNOSIS — I10 Essential (primary) hypertension: Secondary | ICD-10-CM | POA: Diagnosis not present

## 2020-10-10 DIAGNOSIS — D696 Thrombocytopenia, unspecified: Secondary | ICD-10-CM | POA: Diagnosis present

## 2020-10-10 DIAGNOSIS — J440 Chronic obstructive pulmonary disease with acute lower respiratory infection: Secondary | ICD-10-CM | POA: Diagnosis present

## 2020-10-10 DIAGNOSIS — K219 Gastro-esophageal reflux disease without esophagitis: Secondary | ICD-10-CM | POA: Diagnosis present

## 2020-10-10 DIAGNOSIS — E876 Hypokalemia: Secondary | ICD-10-CM | POA: Diagnosis present

## 2020-10-10 DIAGNOSIS — E0500 Thyrotoxicosis with diffuse goiter without thyrotoxic crisis or storm: Secondary | ICD-10-CM | POA: Diagnosis present

## 2020-10-10 DIAGNOSIS — J449 Chronic obstructive pulmonary disease, unspecified: Secondary | ICD-10-CM

## 2020-10-10 HISTORY — DX: Heart failure, unspecified: I50.9

## 2020-10-10 HISTORY — DX: Chronic obstructive pulmonary disease, unspecified: J44.9

## 2020-10-10 HISTORY — DX: Thyrotoxicosis, unspecified without thyrotoxic crisis or storm: E05.90

## 2020-10-10 HISTORY — DX: Pneumonia, unspecified organism: J18.9

## 2020-10-10 LAB — COMPREHENSIVE METABOLIC PANEL
ALT: 54 U/L — ABNORMAL HIGH (ref 0–44)
AST: 110 U/L — ABNORMAL HIGH (ref 15–41)
Albumin: 3.4 g/dL — ABNORMAL LOW (ref 3.5–5.0)
Alkaline Phosphatase: 221 U/L — ABNORMAL HIGH (ref 38–126)
Anion gap: 12 (ref 5–15)
BUN: 22 mg/dL (ref 8–23)
CO2: 17 mmol/L — ABNORMAL LOW (ref 22–32)
Calcium: 8.5 mg/dL — ABNORMAL LOW (ref 8.9–10.3)
Chloride: 102 mmol/L (ref 98–111)
Creatinine, Ser: 0.73 mg/dL (ref 0.44–1.00)
GFR, Estimated: >60 mL/min (ref >60)
Glucose, Bld: 106 mg/dL — ABNORMAL HIGH (ref 70–99)
Potassium: 3.3 mmol/L — ABNORMAL LOW (ref 3.5–5.1)
Sodium: 131 mmol/L — ABNORMAL LOW (ref 135–145)
Total Bilirubin: 1.5 mg/dL — ABNORMAL HIGH (ref 0.3–1.2)
Total Protein: 6.8 g/dL (ref 6.5–8.1)

## 2020-10-10 LAB — BLOOD GAS, VENOUS
Acid-base deficit: 8.8 mmol/L — ABNORMAL HIGH (ref 0.0–2.0)
Bicarbonate: 13.9 mmol/L — ABNORMAL LOW (ref 20.0–28.0)
FIO2: 1
O2 Saturation: 71.1 %
Patient temperature: 39.2
pCO2, Ven: 21 mmHg — ABNORMAL LOW (ref 44.0–60.0)
pH, Ven: 7.4 (ref 7.250–7.430)
pO2, Ven: 42 mmHg (ref 32.0–45.0)

## 2020-10-10 LAB — CBC WITH DIFFERENTIAL/PLATELET
Abs Immature Granulocytes: 0.01 10*3/uL (ref 0.00–0.07)
Basophils Absolute: 0 10*3/uL (ref 0.0–0.1)
Basophils Relative: 0 %
Eosinophils Absolute: 0 10*3/uL (ref 0.0–0.5)
Eosinophils Relative: 0 %
HCT: 35 % — ABNORMAL LOW (ref 36.0–46.0)
Hemoglobin: 11.1 g/dL — ABNORMAL LOW (ref 12.0–15.0)
Immature Granulocytes: 0 %
Lymphocytes Relative: 36 %
Lymphs Abs: 2.6 10*3/uL (ref 0.7–4.0)
MCH: 26 pg (ref 26.0–34.0)
MCHC: 31.7 g/dL (ref 30.0–36.0)
MCV: 82 fL (ref 80.0–100.0)
Monocytes Absolute: 0.3 10*3/uL (ref 0.1–1.0)
Monocytes Relative: 4 %
Neutro Abs: 4.3 10*3/uL (ref 1.7–7.7)
Neutrophils Relative %: 60 %
Platelets: 128 10*3/uL — ABNORMAL LOW (ref 150–400)
RBC: 4.27 MIL/uL (ref 3.87–5.11)
RDW: 13.4 % (ref 11.5–15.5)
Smear Review: NORMAL
WBC: 7.2 10*3/uL (ref 4.0–10.5)
nRBC: 0 % (ref 0.0–0.2)

## 2020-10-10 LAB — BRAIN NATRIURETIC PEPTIDE: B Natriuretic Peptide: 850.7 pg/mL — ABNORMAL HIGH (ref 0.0–100.0)

## 2020-10-10 LAB — URINALYSIS, COMPLETE (UACMP) WITH MICROSCOPIC
Bacteria, UA: NONE SEEN
Bilirubin Urine: NEGATIVE
Glucose, UA: NEGATIVE mg/dL
Ketones, ur: NEGATIVE mg/dL
Leukocytes,Ua: NEGATIVE
Nitrite: NEGATIVE
Protein, ur: NEGATIVE mg/dL
Specific Gravity, Urine: 1.016 (ref 1.005–1.030)
Squamous Epithelial / HPF: NONE SEEN (ref 0–5)
pH: 5 (ref 5.0–8.0)

## 2020-10-10 LAB — LACTIC ACID, PLASMA
Lactic Acid, Venous: 2.9 mmol/L (ref 0.5–1.9)
Lactic Acid, Venous: 4.3 mmol/L (ref 0.5–1.9)
Lactic Acid, Venous: 2.2 mmol/L (ref 0.5–1.9)

## 2020-10-10 LAB — TROPONIN I (HIGH SENSITIVITY)
Troponin I (High Sensitivity): 33 ng/L — ABNORMAL HIGH (ref <18)
Troponin I (High Sensitivity): 36 ng/L — ABNORMAL HIGH (ref <18)

## 2020-10-10 LAB — RESP PANEL BY RT-PCR (FLU A&B, COVID) ARPGX2
Influenza A by PCR: NEGATIVE
Influenza B by PCR: NEGATIVE
SARS Coronavirus 2 by RT PCR: NEGATIVE

## 2020-10-10 MED ORDER — VITAMIN D (ERGOCALCIFEROL) 1.25 MG (50000 UNIT) PO CAPS: 50000.0000 [IU] | ORAL_CAPSULE | ORAL | Status: DC

## 2020-10-10 MED ORDER — ACETAMINOPHEN 500 MG PO TABS
1000.0000 mg | ORAL_TABLET | Freq: Once | ORAL | Status: AC
  Administered 2020-10-10: 1000 mg via ORAL
  Filled 2020-10-10: qty 2

## 2020-10-10 MED ORDER — GUAIFENESIN-CODEINE 100-10 MG/5ML PO SOLN
5.0000 mL | Freq: Four times a day (QID) | ORAL | Status: DC | PRN
Start: 2020-10-10 — End: 2020-10-13
  Administered 2020-10-11 – 2020-10-12 (×5): 5 mL via ORAL
  Filled 2020-10-10 (×5): qty 5

## 2020-10-10 MED ORDER — IPRATROPIUM-ALBUTEROL 0.5-2.5 (3) MG/3ML IN SOLN: 3.0000 mL | RESPIRATORY_TRACT | Status: DC

## 2020-10-10 MED ORDER — SODIUM CHLORIDE 0.9 % IV SOLN
500.0000 mg | INTRAVENOUS | Status: DC
  Administered 2020-10-10: 500 mg via INTRAVENOUS
  Filled 2020-10-10 (×2): qty 500

## 2020-10-10 MED ORDER — ALBUTEROL SULFATE (2.5 MG/3ML) 0.083% IN NEBU: 2.5000 mg | INHALATION_SOLUTION | Freq: Four times a day (QID) | RESPIRATORY_TRACT | Status: DC

## 2020-10-10 MED ORDER — MONTELUKAST SODIUM 10 MG PO TABS
10.0000 mg | ORAL_TABLET | Freq: Every day | ORAL | Status: DC
  Administered 2020-10-10 – 2020-10-12 (×3): 10 mg via ORAL
  Filled 2020-10-10 (×4): qty 1

## 2020-10-10 MED ORDER — SODIUM CHLORIDE 0.9 % IV BOLUS (SEPSIS)
500.0000 mL | Freq: Once | INTRAVENOUS | Status: AC
  Administered 2020-10-10: 500 mL via INTRAVENOUS

## 2020-10-10 MED ORDER — SODIUM CHLORIDE 0.9% FLUSH: 3.0000 mL | INTRAVENOUS | Status: DC | PRN

## 2020-10-10 MED ORDER — ALBUTEROL SULFATE (2.5 MG/3ML) 0.083% IN NEBU: 2.5000 mg | INHALATION_SOLUTION | RESPIRATORY_TRACT | Status: DC | PRN

## 2020-10-10 MED ORDER — ONDANSETRON HCL 4 MG PO TABS: 4.0000 mg | ORAL_TABLET | Freq: Four times a day (QID) | ORAL | Status: DC | PRN

## 2020-10-10 MED ORDER — SODIUM CHLORIDE 0.9% FLUSH
3.0000 mL | Freq: Two times a day (BID) | INTRAVENOUS | Status: DC
  Administered 2020-10-10 – 2020-10-12 (×5): 3 mL via INTRAVENOUS

## 2020-10-10 MED ORDER — METHIMAZOLE 10 MG PO TABS
20.0000 mg | ORAL_TABLET | Freq: Every day | ORAL | Status: DC
  Administered 2020-10-11 – 2020-10-13 (×3): 20 mg via ORAL
  Filled 2020-10-10 (×4): qty 2

## 2020-10-10 MED ORDER — PANTOPRAZOLE SODIUM 40 MG PO TBEC
40.0000 mg | DELAYED_RELEASE_TABLET | Freq: Every day | ORAL | Status: DC
  Administered 2020-10-11 – 2020-10-13 (×3): 40 mg via ORAL
  Filled 2020-10-10 (×3): qty 1

## 2020-10-10 MED ORDER — IOHEXOL 350 MG/ML SOLN
75.0000 mL | Freq: Once | INTRAVENOUS | Status: AC | PRN
  Administered 2020-10-10: 75 mL via INTRAVENOUS

## 2020-10-10 MED ORDER — ACETAMINOPHEN 650 MG RE SUPP: 650.0000 mg | Freq: Four times a day (QID) | RECTAL | Status: DC | PRN

## 2020-10-10 MED ORDER — ACYCLOVIR 200 MG PO CAPS
400.0000 mg | ORAL_CAPSULE | Freq: Every day | ORAL | Status: DC
  Administered 2020-10-11 – 2020-10-13 (×3): 400 mg via ORAL
  Filled 2020-10-10 (×3): qty 2

## 2020-10-10 MED ORDER — ENOXAPARIN SODIUM 40 MG/0.4ML ~~LOC~~ SOLN
40.0000 mg | SUBCUTANEOUS | Status: DC
  Administered 2020-10-10 – 2020-10-12 (×3): 40 mg via SUBCUTANEOUS
  Filled 2020-10-10 (×3): qty 0.4

## 2020-10-10 MED ORDER — AZITHROMYCIN 500 MG PO TABS: 500.0000 mg | ORAL_TABLET | Freq: Every day | ORAL | Status: DC

## 2020-10-10 MED ORDER — MIRTAZAPINE 15 MG PO TABS
15.0000 mg | ORAL_TABLET | Freq: Every day | ORAL | Status: DC
  Administered 2020-10-11 – 2020-10-12 (×2): 15 mg via ORAL
  Filled 2020-10-10 (×3): qty 1

## 2020-10-10 MED ORDER — ASPIRIN EC 81 MG PO TBEC
81.0000 mg | DELAYED_RELEASE_TABLET | Freq: Every day | ORAL | Status: DC
  Administered 2020-10-11 – 2020-10-13 (×3): 81 mg via ORAL
  Filled 2020-10-10 (×3): qty 1

## 2020-10-10 MED ORDER — POLYETHYLENE GLYCOL 3350 17 G PO PACK: 17.0000 g | PACK | Freq: Every day | ORAL | Status: DC | PRN

## 2020-10-10 MED ORDER — METOPROLOL SUCCINATE ER 25 MG PO TB24: 37.5000 mg | ORAL_TABLET | Freq: Every day | ORAL | Status: DC

## 2020-10-10 MED ORDER — FLUTICASONE FUROATE-VILANTEROL 200-25 MCG/INH IN AEPB
1.0000 | INHALATION_SPRAY | Freq: Every day | RESPIRATORY_TRACT | Status: DC
  Administered 2020-10-11 – 2020-10-12 (×2): 1 via RESPIRATORY_TRACT
  Filled 2020-10-10: qty 28

## 2020-10-10 MED ORDER — SODIUM CHLORIDE 0.9 % IV SOLN: 2.0000 g | INTRAVENOUS | Status: DC

## 2020-10-10 MED ORDER — ENSURE ENLIVE PO LIQD: 237.0000 mL | Freq: Two times a day (BID) | ORAL | Status: DC

## 2020-10-10 MED ORDER — POTASSIUM CHLORIDE CRYS ER 20 MEQ PO TBCR
40.0000 meq | EXTENDED_RELEASE_TABLET | Freq: Once | ORAL | Status: AC
  Administered 2020-10-10: 40 meq via ORAL

## 2020-10-10 MED ORDER — MEGESTROL ACETATE 625 MG/5ML PO SUSP: 625.0000 mg | Freq: Every day | ORAL | Status: DC

## 2020-10-10 MED ORDER — ONDANSETRON HCL 4 MG/2ML IJ SOLN: 4.0000 mg | Freq: Four times a day (QID) | INTRAMUSCULAR | Status: DC | PRN

## 2020-10-10 MED ORDER — POTASSIUM CHLORIDE CRYS ER 20 MEQ PO TBCR
20.0000 meq | EXTENDED_RELEASE_TABLET | Freq: Two times a day (BID) | ORAL | Status: DC
Start: 2020-10-10 — End: 2020-10-12
  Administered 2020-10-10 – 2020-10-12 (×4): 20 meq via ORAL
  Filled 2020-10-10 (×4): qty 1

## 2020-10-10 MED ORDER — SODIUM CHLORIDE 0.9 % IV SOLN: 250.0000 mL | INTRAVENOUS | Status: DC | PRN

## 2020-10-10 MED ORDER — SODIUM CHLORIDE 0.9 % IV SOLN
2.0000 g | INTRAVENOUS | Status: DC
  Administered 2020-10-10 – 2020-10-12 (×3): 2 g via INTRAVENOUS
  Filled 2020-10-10: qty 2
  Filled 2020-10-10 (×2): qty 20
  Filled 2020-10-10: qty 2

## 2020-10-10 MED ORDER — SODIUM CHLORIDE 0.9 % IV BOLUS
1000.0000 mL | Freq: Once | INTRAVENOUS | Status: AC
  Administered 2020-10-10: 1000 mL via INTRAVENOUS

## 2020-10-10 MED ORDER — IPRATROPIUM-ALBUTEROL 0.5-2.5 (3) MG/3ML IN SOLN
3.0000 mL | Freq: Three times a day (TID) | RESPIRATORY_TRACT | Status: DC
  Administered 2020-10-11: 3 mL via RESPIRATORY_TRACT
  Filled 2020-10-10: qty 3

## 2020-10-10 MED ORDER — ACETAMINOPHEN 325 MG PO TABS: 650.0000 mg | ORAL_TABLET | Freq: Four times a day (QID) | ORAL | Status: DC | PRN

## 2020-10-10 NOTE — Sepsis Progress Note (Signed)
Notified provider of need to order repeat lactic acid. ° °

## 2020-10-10 NOTE — ED Triage Notes (Signed)
Pt comes into the ED via [facility] from home c/o respiratory distress with room air sat at 84%.  Pt has h/o COPD and CHF.  Pt also has cancer (bone?).  Pt placed on CPAP with 125 solu-mderol and 1 duoneb.  Pt presents with crackles throughout.  20 R AC and 20 R wrist.  Pt on CPAP was able to sat at 96%.

## 2020-10-10 NOTE — Progress Notes (Signed)
   10/10/20 1847  Assess: MEWS Score  Temp 97.7 F (36.5 C)  BP (!) 89/59  Pulse Rate (!) 116  Resp 19  SpO2 100 %  Assess: MEWS Score  MEWS Temp 0  MEWS Systolic 1  MEWS Pulse 2  MEWS RR 0  MEWS LOC 0  MEWS Score 3  MEWS Score Color Yellow  Assess: if the MEWS score is Yellow or Red  Were vital signs taken at a resting state? Yes  Focused Assessment Change from prior assessment (see assessment flowsheet)  Early Detection of Sepsis Score *See Row Information* High  MEWS guidelines implemented *See Row Information* Yes  Treat  MEWS Interventions Escalated (See documentation below);Administered scheduled meds/treatments  Pain Scale 0-10  Pain Score Asleep  Take Vital Signs  Increase Vital Sign Frequency  Yellow: Q 2hr X 2 then Q 4hr X 2, if remains yellow, continue Q 4hrs  Escalate  MEWS: Escalate Yellow: discuss with charge nurse/RN and consider discussing with provider and RRT  Notify: Charge Nurse/RN  Name of Charge Nurse/RN Notified Britt Bolognese RN  Date Charge Nurse/RN Notified 10/10/20  Time Charge Nurse/RN Notified 1853  Document  Patient Outcome Other (Comment) (continue to monitor)  Progress note created (see row info) Yes

## 2020-10-10 NOTE — Sepsis Progress Note (Signed)
Code Sepsis being monitored by eLink 

## 2020-10-10 NOTE — H&P (Signed)
History and Physical    Brandi [name]  PTW:[id number]  DOB: October 26, 1950  DOA: 10/10/2020 PCP: [facility]   Patient coming from: home  Chief Complaint: dyspnea  HPI: Brandi [name] is a 70 y.o. female with medical history of Multiple myeloma s/p BMT, hyperthyroidism, right jugular DVT, HTN, diastolic CHF presents with dyspnea and a pulse ox of 84%. Placed on BiPAP and eventually weaned to 6 L [place]. She tells me that she has had shortness of breath, vomiting and diarrhea for a couple of days. Coughing up yellow sputum. Last episode of diarrhea and vomiting were today. No blood noted. No fever noted. She is currently not on any treatment for her MM. She is not on oxygen at baseline.  ED Course: CTA revealed LLL consolidation, cardiomegaly and CAD Temp 102.5, RR in 30-40s, HR in 110s.  Current BP 88/64 Current pulse ox 96% on room air- still very tachycardic.  NA 131 K 3.3 CO2 17 AST 110 ALT 53 T bili 1.5 Trop 33, 36  Lactic acid 2.9 Influenza and COVID neg.   I.5 L NS bolus, Ceftriaxone, Azithromycin given.   Review of Systems:  All other systems reviewed and apart from HPI, are negative.  Past Medical History:  Diagnosis Date  . Anxiety   . At risk for cardiac dysfunction   . Hypertension   . Hypokalemia   . IgA myeloma ([place])   . Multiple myeloma ([place])   . Red blood cell antibody positive, compatible PRBC difficult to obtain   . S/P autologous bone marrow transplantation [facility])     Past Surgical History:  Procedure Laterality Date  . ESOPHAGOGASTRODUODENOSCOPY (EGD) WITH PROPOFOL N/A 03/30/2019   Procedure: ESOPHAGOGASTRODUODENOSCOPY (EGD) WITH PROPOFOL;  Surgeon: Jonathon Bellows, MD;  Location: [facility] ENDOSCOPY;  Service: Gastroenterology;  Laterality: N/A;  . IR FLUORO GUIDE PORT INSERTION RIGHT  12/16/2017    Social History:   reports that she has been smoking cigarettes. She has been smoking about 0.50 packs per day. She has never used smokeless  tobacco. She reports that she does not drink alcohol and does not use drugs. Smokes 2-3 cigarettes per day- son lives wit her. Does not use walker or cane.   No Known Allergies  Family History  Problem Relation Age of Onset  . Pneumonia Mother   . Seizures Father      Prior to Admission medications   Medication Sig Start Date End Date Taking? Authorizing Provider  acyclovir (ZOVIRAX) 400 MG tablet TAKE 1 TABLET BY MOUTH ONCE DAILY TO PREVENT SHINGLES 07/22/20   Verlon Au, NP  albuterol (VENTOLIN HFA) 108 (90 Base) MCG/ACT inhaler Inhale 2 puffs into the lungs every 6 (six) hours as needed for wheezing or shortness of breath. 05/24/20   Hinda Kehr, MD  aspirin EC 81 MG EC tablet Take 1 tablet (81 mg total) by mouth daily. 01/25/20   Loel Dubonnet, NP  ergocalciferol (VITAMIN D2) 1.25 MG (50000 UT) capsule Take 1 capsule (50,000 Units total) by mouth once a week. 06/25/20   [name], MD  feeding supplement, ENSURE ENLIVE, (ENSURE ENLIVE) LIQD Take 237 mLs by mouth 2 (two) times daily between meals. 04/12/17   Gouru, Illene Silver, MD  Fluticasone-Salmeterol (ADVAIR DISKUS) 500-50 MCG/DOSE AEPB Inhale 1 puff into the lungs 2 (two) times daily. 10/02/19   [name], MD  furosemide (LASIX) 40 MG tablet Take 1 tablet (40 mg total) by mouth daily. 11/12/19   Loel Dubonnet, NP  guaiFENesin-codeine 100-10 MG/5ML syrup TAKE 5 ML BY MOUTH  THREE TIMES DAILY AS NEEDED FOR COUGH. MAY CAUSE DROWSINESS. DO NOT DRIVE WITH THIS MEDICATION 10/07/20   [name], MD  Ipratropium-Albuterol (COMBIVENT) 20-100 MCG/ACT AERS respimat Inhale into the lungs. 03/13/20 03/13/21  [provider]  ipratropium-albuterol (DUONEB) 0.5-2.5 (3) MG/3ML SOLN Take 3 mLs by nebulization every 4 (four) hours as needed. 05/24/20   Hinda Kehr, MD  megestrol (MEGACE ES) 625 MG/5ML suspension Take 5 mLs (625 mg total) by mouth daily. 04/16/20   [name], MD  methimazole  (TAPAZOLE) 10 MG tablet Take 2 tablets by mouth once daily 07/22/20   Verlon Au, NP  metoprolol succinate (TOPROL-XL) 25 MG 24 hr tablet TAKE ONE AND ONE-HALF TABLETS BY MOUTH ONCE DAILY 08/15/20   Loel Dubonnet, NP  mirtazapine (REMERON) 15 MG tablet Take 1 tablet (15 mg total) by mouth at bedtime. 05/28/20   [name], MD  montelukast (SINGULAIR) 10 MG tablet Take 1 tablet (10 mg total) by mouth at bedtime. 07/16/20   [name], MD  pantoprazole (PROTONIX) 40 MG tablet Take 1 tablet by mouth once daily 03/07/20   [name], MD  polyethylene glycol (MIRALAX / GLYCOLAX) packet Take 17 g by mouth daily as needed for mild constipation. 04/11/17   Gouru, Illene Silver, MD  potassium chloride SA (KLOR-CON) 20 MEQ tablet Take 1 tablet (20 mEq total) by mouth 2 (two) times daily. 04/18/20   Verlon Au, NP  spironolactone (ALDACTONE) 25 MG tablet Take 1 tablet (25 mg total) by mouth daily. 08/15/20   Loel Dubonnet, NP    Physical Exam: Wt Readings from Last 3 Encounters:  10/10/20 64.4 kg  08/29/20 62.6 kg  08/15/20 63 kg   Vitals:   10/10/20 1500 10/10/20 1530 10/10/20 1600 10/10/20 1630  BP: 113/62 100/61 101/62   Pulse: (!) 138 (!) 142 (!) 142   Resp: (!) 36 (!) 40 (!) 40   Temp:    98.4 F (36.9 C)  TempSrc:    Oral  SpO2: 100% 99% 99%   Weight:      Height:          Constitutional:  Calm & comfortable Eyes: PERRLA, lids and conjunctivae normal ENT:  Mucous membranes are moist.  Pharynx clear of exudate   Normal dentition.  Neck: Supple, no masses  Respiratory:  Poor breath sounds in LLL. RR in 61s. Normal respiratory effort.  Cardiovascular:  S1 & S2 heard, regular rate and rhythm- HR in 100s No Murmurs Abdomen:  Non distended No tenderness, No masses Bowel sounds normal Extremities:  No clubbing / cyanosis No pedal edema No joint deformity    Skin:  No rashes, lesions or ulcers Neurologic:  AAO x 3 CN 2-12 grossly  intact Sensation intact Strength 5/5 in all 4 extremities Psychiatric:  Normal Mood and affect    Labs on Admission: I have personally reviewed following labs and imaging studies  CBC: Recent Labs  Lab 10/10/20 1354  WBC 7.2  NEUTROABS 4.3  HGB 11.1*  HCT 35.0*  MCV 82.0  PLT 322*   Basic Metabolic Panel: Recent Labs  Lab 10/10/20 1354  NA 131*  K 3.3*  CL 102  CO2 17*  GLUCOSE 106*  BUN 22  CREATININE 0.73  CALCIUM 8.5*   GFR: Estimated Creatinine Clearance: 59.3 mL/min (by C-G formula based on SCr of 0.73 mg/dL). Liver Function Tests: Recent Labs  Lab 10/10/20 1354  AST 110*  ALT 54*  ALKPHOS 221*  BILITOT 1.5*  PROT 6.8  ALBUMIN 3.4*   No results for input(s): LIPASE, AMYLASE in the last 168 hours. No results for input(s): AMMONIA in the last 168 hours. Coagulation Profile: No results for input(s): INR, PROTIME in the last 168 hours. Cardiac Enzymes: No results for input(s): CKTOTAL, CKMB, CKMBINDEX, TROPONINI in the last 168 hours. BNP (last 3 results) No results for input(s): PROBNP in the last 8760 hours. HbA1C: No results for input(s): HGBA1C in the last 72 hours. CBG: No results for input(s): GLUCAP in the last 168 hours. Lipid Profile: No results for input(s): CHOL, HDL, LDLCALC, TRIG, CHOLHDL, LDLDIRECT in the last 72 hours. Thyroid Function Tests: No results for input(s): TSH, T4TOTAL, FREET4, T3FREE, THYROIDAB in the last 72 hours. Anemia Panel: No results for input(s): VITAMINB12, FOLATE, FERRITIN, TIBC, IRON, RETICCTPCT in the last 72 hours. Urine analysis:    Component Value Date/Time   COLORURINE YELLOW (A) 08/30/2018 1055   APPEARANCEUR CLOUDY (A) 08/30/2018 1055   LABSPEC 1.019 08/30/2018 1055   PHURINE 5.0 08/30/2018 1055   GLUCOSEU NEGATIVE 08/30/2018 1055   HGBUR SMALL (A) 08/30/2018 1055   BILIRUBINUR NEGATIVE 08/30/2018 1055   KETONESUR 5 (A) 08/30/2018 1055   PROTEINUR 100 (A) 08/30/2018 1055   NITRITE NEGATIVE  08/30/2018 1055   LEUKOCYTESUR NEGATIVE 08/30/2018 1055   Sepsis Labs: '@LABRCNTIP' (procalcitonin:4,lacticidven:4) ) Recent Results (from the past 240 hour(s))  Resp Panel by RT-PCR (Flu A&B, Covid) Nasopharyngeal Swab     Status: None   Collection Time: 10/10/20  2:06 PM   Specimen: Nasopharyngeal Swab; Nasopharyngeal(NP) swabs in vial transport medium  Result Value Ref Range Status   SARS Coronavirus 2 by RT PCR NEGATIVE NEGATIVE Final    Comment: (NOTE) SARS-CoV-2 target nucleic acids are NOT DETECTED.  The SARS-CoV-2 RNA is generally detectable in upper respiratory specimens during the acute phase of infection. The lowest concentration of SARS-CoV-2 viral copies this assay can detect is 138 copies/mL. A negative result does not preclude SARS-Cov-2 infection and should not be used as the sole basis for treatment or other patient management decisions. A negative result may occur with  improper specimen collection/handling, submission of specimen other than nasopharyngeal swab, presence of viral mutation(s) within the areas targeted by this assay, and inadequate number of viral copies(<138 copies/mL). A negative result must be combined with clinical observations, patient history, and epidemiological information. The expected result is Negative.  Fact Sheet for Patients:  EntrepreneurPulse.com.au  Fact Sheet for Healthcare Providers:  IncredibleEmployment.be  This test is no t yet approved or cleared by the [place] FDA and  has been authorized for detection and/or diagnosis of SARS-CoV-2 by FDA under an Emergency Use Authorization (EUA). This EUA will remain  in effect (meaning this test can be used) for the duration of the COVID-19 declaration under Section 564(b)(1) of the Act, 21 U.S.C.section 360bbb-3(b)(1), unless the authorization is terminated  or revoked sooner.       Influenza A by PCR NEGATIVE NEGATIVE Final   Influenza B  by PCR NEGATIVE NEGATIVE Final    Comment: (NOTE) The Xpert Xpress SARS-CoV-2/FLU/RSV plus assay is intended as an aid in the diagnosis of influenza from Nasopharyngeal swab specimens and should not be used as a sole basis for treatment. Nasal washings and aspirates are unacceptable for Xpert Xpress SARS-CoV-2/FLU/RSV testing.  Fact Sheet for Patients: EntrepreneurPulse.com.au  Fact Sheet for Healthcare Providers: IncredibleEmployment.be  This test is not yet approved or cleared  by the [place] and has been authorized for detection and/or diagnosis of SARS-CoV-2 by FDA under an Emergency Use Authorization (EUA). This EUA will remain in effect (meaning this test can be used) for the duration of the COVID-19 declaration under Section 564(b)(1) of the Act, 21 U.S.C. section 360bbb-3(b)(1), unless the authorization is terminated or revoked.  Performed at [facility], [address] [place], [place] [address]      Radiological Exams on Admission: CT Angio Chest PE W and/or Wo Contrast  Result Date: 10/10/2020 CLINICAL DATA:  Respiratory distress. EXAM: CT ANGIOGRAPHY CHEST WITH CONTRAST TECHNIQUE: Multidetector CT imaging of the chest was performed using the standard protocol during bolus administration of intravenous contrast. Multiplanar CT image reconstructions and MIPs were obtained to evaluate the vascular anatomy. CONTRAST:  64m OMNIPAQUE IOHEXOL 350 MG/ML SOLN COMPARISON:  05/24/2020 FINDINGS: Cardiovascular: Cardiomegaly, stable. Coronary artery and aortic atherosclerosis. No filling defects in the pulmonary arteries to suggest pulmonary emboli. Mediastinum/Nodes: No mediastinal, hilar, or axillary adenopathy. Thyroid markedly enlarged, stable since prior study. Lungs/Pleura: Dense consolidation in the left lower lobe concerning for pneumonia. No confluent opacity on the right. No effusions. Upper Abdomen: Imaging into the  upper abdomen demonstrates no acute findings. Review of the MIP images confirms the above findings. IMPRESSION: Dense consolidation throughout the entire left lower lobe most compatible with pneumonia. No evidence of pulmonary embolus. Cardiomegaly, coronary artery disease. Aortic Atherosclerosis (ICD10-I70.0). Electronically Signed   By: KRolm BaptiseM.D.   On: 10/10/2020 17:01   CT ABDOMEN PELVIS W CONTRAST  Result Date: 10/10/2020 CLINICAL DATA:  Abdominal distention EXAM: CT ABDOMEN AND PELVIS WITH CONTRAST TECHNIQUE: Multidetector CT imaging of the abdomen and pelvis was performed using the standard protocol following bolus administration of intravenous contrast. CONTRAST:  761mOMNIPAQUE IOHEXOL 350 MG/ML SOLN COMPARISON:  04/05/2017 FINDINGS: Lower chest: Consolidation.  Seen in the visualized left lower lobe Hepatobiliary: No focal hepatic abnormality. Gallbladder unremarkable. Pancreas: No focal abnormality or ductal dilatation. Spleen: No focal abnormality.  Normal size. Adrenals/Urinary Tract: No adrenal abnormality. No focal renal abnormality. No stones or hydronephrosis. Urinary bladder is unremarkable. Stomach/Bowel: Normal appendix. Stomach, large and small bowel grossly unremarkable. Vascular/Lymphatic: Heavily calcified aorta and iliac vessels. No evidence of aneurysm or adenopathy. Reproductive: Low-density centrally in the uterus could reflect endometrial thickening or central fibroid. Scattered calcifications in the uterus, likely calcified fibroids. No adnexal mass. Other: No free fluid or free air. Musculoskeletal: No acute bony abnormality. IMPRESSION: Heavily calcified aorta and iliac vessels. No acute findings in the abdomen or pelvis. Consolidation in the left lower lobe concerning for pneumonia. Central low-density in the uterus could reflect central fibroid or endometrial thickening. This could be further characterized with nonemergent pelvic ultrasound. Electronically Signed   By:  KeRolm Baptise.D.   On: 10/10/2020 17:05   DG Chest Portable 1 View  Result Date: 10/10/2020 CLINICAL DATA:  Respiratory distress, hypoxia EXAM: PORTABLE CHEST 1 VIEW COMPARISON:  05/24/2020 chest radiograph. FINDINGS: Stable cardiomediastinal silhouette with top-normal heart size. No pneumothorax. No pleural effusion. Lungs appear clear, with no acute consolidative airspace disease and no pulmonary edema. IMPRESSION: No active disease. Electronically Signed   By: JaIlona Sorrel.D.   On: 10/10/2020 15:45    EKG: Independently reviewed. No EKG  Assessment/Plan Principal Problem:   Lobar pneumonia with severe sepsis and acute respiratory failure - cont Ceftriaxone and Azithromycin - concerning for Legionella due to diarrhea - check strep and legionella antigens - hold diuretics -  given 1.5 L- will not give more fluids due to h/o d CHF - repeat Lactic acid  Active Problems: Vomiting and diarrhea - PRN Zofran can be used- document all stools - clear liquids for today    IgA myeloma  - s/p treatment with BMT    Hypokalemia - has a h/o chronic hypokalemia and was also having diarrhea which would lead to losses - replace and recheck tomorrow    Essential hypertension/  Chronic heart failure with preserved ejection fraction (HFpEF)   - BP low - hold antihypertensives and diuretic  Hyperthyroidism - cont Methimazole  GERD - cont Protonix  Shingles - cont Acyclovir for prevention     DVT prophylaxis: enoxaparin (LOVENOX) injection 40 mg Start: 10/10/20 2000   Code Status: Full code  Family Communication: daughter at bedsie  Disposition Plan:  Remains inpatient appropriate because:IV treatments appropriate due to intensity of illness or inability to take PO- due to severity of illness, she will likely require more than 2 Midnights for IV antibiotics to treat pneumonia  Consults called: none  Admission status: inpatient  Level of care: Progressive Cardiac  Debbe Odea  MD [facility] Pager: [email or web address] Password [email or web address] [phone], please contact night-coverage   10/10/2020, 5:31 PM

## 2020-10-10 NOTE — ED Notes (Signed)
Pt transported to CT ?

## 2020-10-10 NOTE — ED Provider Notes (Signed)
[facility] Emergency Department Provider Note  ____________________________________________  Time seen: Approximately 3:27 PM  I have reviewed the triage vital signs and the nursing notes.   HISTORY  Chief Complaint Respiratory Distress    Level 5 Caveat: Portions of the History and Physical including HPI and review of systems are unable to be completely obtained due to patient acute respiratory distress  HPI Brandi [name] is a 70 y.o. female with a history of COPD CHF multiple myeloma status post stem cell transplant, hypertension, anxiety who comes ED complaining of respiratory distress, worsening shortness of breath for the past 24 hours.  EMS report initial room air oxygen saturation was 84%, placed on CPAP during transport with improvement to 96%.  Patient denies chest pain.  No vomiting or diarrhea.  No other pain complaints.      Past Medical History:  Diagnosis Date  . Anxiety   . At risk for cardiac dysfunction   . Hypertension   . Hypokalemia   . IgA myeloma ([place])   . Multiple myeloma ([place])   . Red blood cell antibody positive, compatible PRBC difficult to obtain   . S/P autologous bone marrow transplantation [facility])      Patient Active Problem List   Diagnosis Date Noted  . Chronic heart failure with preserved ejection fraction (HFpEF) ([place]) 12/14/2019  . S/P autologous bone marrow transplantation ([place]) 06/29/2018  . Essential hypertension 05/29/2018  . At risk for cardiac dysfunction 05/26/2018  . Hypokalemia 05/01/2018  . Encounter for monitoring cardiotoxic drug therapy 11/08/2017  . Red blood cell antibody positive, compatible PRBC difficult to obtain 10/21/2017  . Cough in adult patient 07/29/2017  . Hip pain, acute, right 07/29/2017  . IgA myeloma ([place]) 04/08/2017  . ARF (acute renal failure) ([place]) 04/05/2017     Past Surgical History:  Procedure Laterality Date  . ESOPHAGOGASTRODUODENOSCOPY (EGD) WITH PROPOFOL N/A  03/30/2019   Procedure: ESOPHAGOGASTRODUODENOSCOPY (EGD) WITH PROPOFOL;  Surgeon: Jonathon Bellows, MD;  Location: [facility] ENDOSCOPY;  Service: Gastroenterology;  Laterality: N/A;  . IR FLUORO GUIDE PORT INSERTION RIGHT  12/16/2017     Prior to Admission medications   Medication Sig Start Date End Date Taking? Authorizing Provider  acyclovir (ZOVIRAX) 400 MG tablet TAKE 1 TABLET BY MOUTH ONCE DAILY TO PREVENT SHINGLES 07/22/20   Verlon Au, NP  albuterol (VENTOLIN HFA) 108 (90 Base) MCG/ACT inhaler Inhale 2 puffs into the lungs every 6 (six) hours as needed for wheezing or shortness of breath. 05/24/20   Hinda Kehr, MD  aspirin EC 81 MG EC tablet Take 1 tablet (81 mg total) by mouth daily. 01/25/20   Loel Dubonnet, NP  ergocalciferol (VITAMIN D2) 1.25 MG (50000 UT) capsule Take 1 capsule (50,000 Units total) by mouth once a week. 06/25/20   [name], MD  feeding supplement, ENSURE ENLIVE, (ENSURE ENLIVE) LIQD Take 237 mLs by mouth 2 (two) times daily between meals. 04/12/17   Gouru, Illene Silver, MD  Fluticasone-Salmeterol (ADVAIR DISKUS) 500-50 MCG/DOSE AEPB Inhale 1 puff into the lungs 2 (two) times daily. 10/02/19   [name], MD  furosemide (LASIX) 40 MG tablet Take 1 tablet (40 mg total) by mouth daily. 11/12/19   Loel Dubonnet, NP  guaiFENesin-codeine 100-10 MG/5ML syrup TAKE 5 ML BY MOUTH  THREE TIMES DAILY AS NEEDED FOR COUGH. MAY CAUSE DROWSINESS. DO NOT DRIVE WITH THIS MEDICATION 10/07/20   [name], MD  Ipratropium-Albuterol (COMBIVENT) 20-100 MCG/ACT AERS respimat Inhale into the lungs.  03/13/20 03/13/21  [provider]  ipratropium-albuterol (DUONEB) 0.5-2.5 (3) MG/3ML SOLN Take 3 mLs by nebulization every 4 (four) hours as needed. 05/24/20   Hinda Kehr, MD  megestrol (MEGACE ES) 625 MG/5ML suspension Take 5 mLs (625 mg total) by mouth daily. 04/16/20   [name], MD  methimazole (TAPAZOLE) 10 MG tablet Take 2 tablets by mouth once  daily 07/22/20   Verlon Au, NP  metoprolol succinate (TOPROL-XL) 25 MG 24 hr tablet TAKE ONE AND ONE-HALF TABLETS BY MOUTH ONCE DAILY 08/15/20   Loel Dubonnet, NP  mirtazapine (REMERON) 15 MG tablet Take 1 tablet (15 mg total) by mouth at bedtime. 05/28/20   [name], MD  montelukast (SINGULAIR) 10 MG tablet Take 1 tablet (10 mg total) by mouth at bedtime. 07/16/20   [name], MD  pantoprazole (PROTONIX) 40 MG tablet Take 1 tablet by mouth once daily 03/07/20   [name], MD  polyethylene glycol (MIRALAX / GLYCOLAX) packet Take 17 g by mouth daily as needed for mild constipation. 04/11/17   Gouru, Illene Silver, MD  potassium chloride SA (KLOR-CON) 20 MEQ tablet Take 1 tablet (20 mEq total) by mouth 2 (two) times daily. 04/18/20   Verlon Au, NP  spironolactone (ALDACTONE) 25 MG tablet Take 1 tablet (25 mg total) by mouth daily. 08/15/20   Loel Dubonnet, NP     Allergies Patient has no known allergies.   Family History  Problem Relation Age of Onset  . Pneumonia Mother   . Seizures Father     Social History Social History   Tobacco Use  . Smoking status: Current Every Day Smoker    Packs/day: 0.50    Types: Cigarettes  . Smokeless tobacco: Never Used  . Tobacco comment: 2 cigarettes QOD  Vaping Use  . Vaping Use: Never used  Substance Use Topics  . Alcohol use: No  . Drug use: No    Review of Systems Level 5 Caveat: Portions of the History and Physical including HPI and review of systems are unable to be completely obtained due to patient being a poor historian   Constitutional:   No known fever.  ENT:   No rhinorrhea. Cardiovascular:   No chest pain or syncope. Respiratory:   Positive shortness of breath without cough. Gastrointestinal:   Negative for abdominal pain, vomiting and diarrhea.  Musculoskeletal:   Negative for focal pain or swelling ____________________________________________   PHYSICAL EXAM:  VITAL SIGNS: ED  Triage Vitals  Enc Vitals Group     BP 10/10/20 1356 (!) 143/61     Pulse Rate 10/10/20 1356 (!) 110     Resp 10/10/20 1356 (!) 41     Temp 10/10/20 1356 (!) 102.5 F (39.2 C)     Temp Source 10/10/20 1356 Axillary     SpO2 10/10/20 1356 99 %     Weight 10/10/20 1352 142 lb (64.4 kg)     Height 10/10/20 1352 5' 2" (1.575 m)     Head Circumference --      Peak Flow --      Pain Score 10/10/20 1350 4     Pain Loc --      Pain Edu? --      Excl. in [place]? --     Vital signs reviewed, nursing assessments reviewed.   Constitutional:   Alert and oriented.  Ill-appearing Eyes:   Conjunctivae are normal. EOMI. PERRL. ENT      Head:  Normocephalic and atraumatic.      Nose:   No congestion/rhinnorhea.       Mouth/Throat:   Dry mucous membranes, no pharyngeal erythema. No peritonsillar mass.       Neck:   No meningismus. Full ROM. Hematological/Lymphatic/Immunilogical:   No cervical lymphadenopathy. Cardiovascular:   Tachycardia heart rate 115. Symmetric bilateral radial and DP pulses.  No murmurs. Cap refill less than 2 seconds. Respiratory: Tachypnea.  Bilateral lower lung crackles, right greater than left Gastrointestinal:   Soft and nontender. Non distended. There is no CVA tenderness.  No rebound, rigidity, or guarding.  Musculoskeletal:   Normal range of motion in all extremities. No joint effusions.  No lower extremity tenderness.  Trace bilateral pitting edema. Neurologic:   Normal speech and language.  Motor grossly intact. No acute focal neurologic deficits are appreciated.  Skin:    Skin is warm, dry and intact. No rash noted.  No petechiae, purpura, or bullae.  ____________________________________________    LABS (pertinent positives/negatives) (all labs ordered are listed, but only abnormal results are displayed) Labs Reviewed  COMPREHENSIVE METABOLIC PANEL - Abnormal; Notable for the following components:      Result Value   Sodium 131 (*)    Potassium 3.3 (*)     CO2 17 (*)    Glucose, Bld 106 (*)    Calcium 8.5 (*)    Albumin 3.4 (*)    AST 110 (*)    ALT 54 (*)    Alkaline Phosphatase 221 (*)    Total Bilirubin 1.5 (*)    All other components within normal limits  BRAIN NATRIURETIC PEPTIDE - Abnormal; Notable for the following components:   B Natriuretic Peptide 850.7 (*)    All other components within normal limits  CBC WITH DIFFERENTIAL/PLATELET - Abnormal; Notable for the following components:   Hemoglobin 11.1 (*)    HCT 35.0 (*)    Platelets 128 (*)    All other components within normal limits  BLOOD GAS, VENOUS - Abnormal; Notable for the following components:   pCO2, Ven 21 (*)    Bicarbonate 13.9 (*)    Acid-base deficit 8.8 (*)    All other components within normal limits  TROPONIN I (HIGH SENSITIVITY) - Abnormal; Notable for the following components:   Troponin I (High Sensitivity) 33 (*)    All other components within normal limits  RESP PANEL BY RT-PCR (FLU A&B, COVID) ARPGX2  CULTURE, BLOOD (ROUTINE X 2)  CULTURE, BLOOD (ROUTINE X 2)  URINE CULTURE  LACTIC ACID, PLASMA  LACTIC ACID, PLASMA  URINALYSIS, COMPLETE (UACMP) WITH MICROSCOPIC  TROPONIN I (HIGH SENSITIVITY)   ____________________________________________   EKG    ____________________________________________    RADIOLOGY  No results found.  ____________________________________________   PROCEDURES .Critical Care Performed by: Carrie Mew, MD Authorized by: Carrie Mew, MD   Critical care provider statement:    Critical care time (minutes):  35   Critical care time was exclusive of:  Separately billable procedures and treating other patients   Critical care was necessary to treat or prevent imminent or life-threatening deterioration of the following conditions:  Sepsis and respiratory failure   Critical care was time spent personally by me on the following activities:  Development of treatment plan with patient or surrogate,  discussions with consultants, evaluation of patient's response to treatment, examination of patient, obtaining history from patient or surrogate, ordering and performing treatments and interventions, ordering and review of laboratory studies, ordering and review of radiographic studies, pulse  oximetry, re-evaluation of patient's condition and review of old charts    ____________________________________________  DIFFERENTIAL DIAGNOSIS   Pneumonia, pleural effusion, pulmonary edema, pulmonary embolism, COPD exacerbation  CLINICAL IMPRESSION / ASSESSMENT AND PLAN / ED COURSE  Medications ordered in the ED: Medications  sodium chloride 0.9 % bolus 500 mL (500 mLs Intravenous New Bag/Given 10/10/20 1435)  cefTRIAXone (ROCEPHIN) 2 g in sodium chloride 0.9 % 100 mL IVPB (0 g Intravenous Stopped 10/10/20 1512)  azithromycin (ZITHROMAX) 500 mg in sodium chloride 0.9 % 250 mL IVPB (500 mg Intravenous New Bag/Given 10/10/20 1512)  acetaminophen (TYLENOL) tablet 1,000 mg (has no administration in time range)    Pertinent labs & imaging results that were available during my care of the patient were reviewed by me and considered in my medical decision making (see chart for details).   JOZELYNN DANIELSON was evaluated in Emergency Department on 10/10/2020 for the symptoms described in the history of present illness. She was evaluated in the context of the global COVID-19 pandemic, which necessitated consideration that the patient might be at risk for infection with the SARS-CoV-2 virus that causes COVID-19. Institutional protocols and algorithms that pertain to the evaluation of patients at risk for COVID-19 are in a state of rapid change based on information released by regulatory bodies including the CDC and federal and state organizations. These policies and algorithms were followed during the patient's care in the ED.   Patient presents with hypoxia and shortness of breath.  Crackles on exam.  Chest x-ray  viewed and interpreted by me, appears unremarkable, without pneumothorax pneumonia or pronounced effusion.  Radiology report agrees.  COVID and flu test is negative.  Patient is febrile tachycardic tachypnea, concern for sepsis so cultures obtained and ceftriaxone ordered.  With so far unrevealing work-up and her cancer history, CT angiogram of the chest ordered to rule out PE.  Doubt pericardial effusion.  Will need to be admitted for hypoxic respiratory failure.      ____________________________________________   FINAL CLINICAL IMPRESSION(S) / ED DIAGNOSES    Final diagnoses:  Acute respiratory failure with hypoxia (HCC)  Stem cells transplant status (HCC)  Chronic obstructive pulmonary disease, unspecified COPD type ([place])  Chronic congestive heart failure, unspecified heart failure type [facility])     ED Discharge Orders    None      Portions of this note were generated with dragon dictation software. Dictation errors may occur despite best attempts at proofreading.   Carrie Mew, MD 10/10/20 1531

## 2020-10-10 NOTE — ED Provider Notes (Signed)
   Patient handed off pending CT imaging and admission to the hospital.  Reevaluated patient heart rate has gone up to the 140s.  I ordered Tylenol for patient.  Patient already had some fluid resuscitation started as well as ceftriaxone and azithromycin.  They held off on full fluid resuscitation due to concern for potential pulmonary edema.  On my repeat evaluation patient states that she is on 4 L at baseline and she is currently on 6 L satting 99%.  CT PE was ordered by off going provider but I also ordered a CT abdomen due to some LFT elevation and some tenderness in her upper abdomen.  We will continue to closely monitor patient while awaiting CT scans.  Patient denies any falls to suggest intracranial hemorrhage  CT scan consistent with left lower lobe pneumonia.  Also incidental thickening of the endometrium which I did tell family about.    Patient's lactate slightly elevated and patient will be given some fluid.  Will discuss with hospital team for admission    PROCEDURES  Procedure(s) performed (including Critical Care):  .Critical Care Performed by: Deshanna Fessenden, MD Authorized by: Ronnae Lambert, MD   Critical care provider statement:    Critical care time (minutes):  35   Critical care was necessary to treat or prevent imminent or life-threatening deterioration of the following conditions:  Respiratory failure   Critical care was time spent personally by me on the following activities:  Discussions with consultants, evaluation of patient's response to treatment, examination of patient, ordering and performing treatments and interventions, ordering and review of laboratory studies, ordering and review of radiographic studies, pulse oximetry, re-evaluation of patient's condition, obtaining history from patient or surrogate and review of old charts           ____________________________________________   FINAL CLINICAL IMPRESSION(S) / ED DIAGNOSES   Final diagnoses:   Acute respiratory failure with hypoxia ([place])  Stem cells transplant status ([place])  Chronic obstructive pulmonary disease, unspecified COPD type ([place])  Chronic congestive heart failure, unspecified heart failure type ([place])      MEDICATIONS GIVEN DURING THIS VISIT:  Medications  cefTRIAXone (ROCEPHIN) 2 g in sodium chloride 0.9 % 100 mL IVPB (0 g Intravenous Stopped 10/10/20 1512)  azithromycin (ZITHROMAX) 500 mg in sodium chloride 0.9 % 250 mL IVPB (500 mg Intravenous New Bag/Given 10/10/20 1512)  sodium chloride 0.9 % bolus 500 mL (0 mLs Intravenous Stopped 10/10/20 1539)  acetaminophen (TYLENOL) tablet 1,000 mg (1,000 mg Oral Given 10/10/20 1531)     ED Discharge Orders    None       Note:  This document was prepared using Dragon voice recognition software and may include unintentional dictation errors.   Teena [place], MD 10/10/20 [phone]

## 2020-10-10 NOTE — Sepsis Progress Note (Addendum)
Lactic acid result still pending. Drawn at 1354. Blood cx's drawn.  NS 527ml and antibiotics given.

## 2020-10-10 NOTE — Progress Notes (Signed)
Patient arrived to unit from ED with Red MEWS. Vitals rechecked and MEWS Yellow at this time due to HR and BP. Daughter Cherlyn Cushing at bedside.

## 2020-10-10 NOTE — Progress Notes (Signed)
CODE SEPSIS - PHARMACY COMMUNICATION  **Broad Spectrum Antibiotics should be administered within 1 hour of Sepsis diagnosis**  Time Code Sepsis Called/Page Received: 1405  Antibiotics Ordered: 1436  Time of 1st antibiotic administration: Rocephin, Azithromycin  Additional action taken by pharmacy: none  If necessary, Name of Provider/Nurse Contacted: n/a    Pearla Dubonnet ,PharmD Clinical Pharmacist  10/10/2020  2:46 PM

## 2020-10-11 ENCOUNTER — Encounter: Payer: Self-pay | Admitting: Internal Medicine

## 2020-10-11 DIAGNOSIS — A409 Streptococcal sepsis, unspecified: Secondary | ICD-10-CM

## 2020-10-11 LAB — BLOOD CULTURE ID PANEL (REFLEXED) - BCID2

## 2020-10-11 LAB — COMPREHENSIVE METABOLIC PANEL
ALT: 50 U/L — ABNORMAL HIGH (ref 0–44)
AST: 66 U/L — ABNORMAL HIGH (ref 15–41)
Albumin: 2.9 g/dL — ABNORMAL LOW (ref 3.5–5.0)
Alkaline Phosphatase: 137 U/L — ABNORMAL HIGH (ref 38–126)
Anion gap: 10 (ref 5–15)
BUN: 23 mg/dL (ref 8–23)
CO2: 16 mmol/L — ABNORMAL LOW (ref 22–32)
Calcium: 8.7 mg/dL — ABNORMAL LOW (ref 8.9–10.3)
Chloride: 109 mmol/L (ref 98–111)
Creatinine, Ser: 0.57 mg/dL (ref 0.44–1.00)
GFR, Estimated: >60 mL/min (ref >60)
Glucose, Bld: 188 mg/dL — ABNORMAL HIGH (ref 70–99)
Potassium: 3.4 mmol/L — ABNORMAL LOW (ref 3.5–5.1)
Sodium: 135 mmol/L (ref 135–145)
Total Bilirubin: 0.7 mg/dL (ref 0.3–1.2)
Total Protein: 6.1 g/dL — ABNORMAL LOW (ref 6.5–8.1)

## 2020-10-11 LAB — BASIC METABOLIC PANEL
Anion gap: 8 (ref 5–15)
BUN: 20 mg/dL (ref 8–23)
CO2: 18 mmol/L — ABNORMAL LOW (ref 22–32)
Calcium: 8.6 mg/dL — ABNORMAL LOW (ref 8.9–10.3)
Chloride: 113 mmol/L — ABNORMAL HIGH (ref 98–111)
Creatinine, Ser: 0.52 mg/dL (ref 0.44–1.00)
GFR, Estimated: >60 mL/min (ref >60)
Glucose, Bld: 96 mg/dL (ref 70–99)
Potassium: 4 mmol/L (ref 3.5–5.1)
Sodium: 139 mmol/L (ref 135–145)

## 2020-10-11 LAB — CBC
HCT: 30.6 % — ABNORMAL LOW (ref 36.0–46.0)
Hemoglobin: 9.8 g/dL — ABNORMAL LOW (ref 12.0–15.0)
MCH: 26.5 pg (ref 26.0–34.0)
MCHC: 32 g/dL (ref 30.0–36.0)
MCV: 82.7 fL (ref 80.0–100.0)
Platelets: 114 10*3/uL — ABNORMAL LOW (ref 150–400)
RBC: 3.7 MIL/uL — ABNORMAL LOW (ref 3.87–5.11)
RDW: 13.6 % (ref 11.5–15.5)
WBC: 11.7 10*3/uL — ABNORMAL HIGH (ref 4.0–10.5)
nRBC: 0 % (ref 0.0–0.2)

## 2020-10-11 LAB — STREP PNEUMONIAE URINARY ANTIGEN: Strep Pneumo Urinary Antigen: NEGATIVE

## 2020-10-11 LAB — HIV ANTIBODY (ROUTINE TESTING W REFLEX): HIV Screen 4th Generation wRfx: NONREACTIVE

## 2020-10-11 MED ORDER — METOPROLOL SUCCINATE ER 25 MG PO TB24: 37.5000 mg | ORAL_TABLET | Freq: Every day | ORAL | Status: DC

## 2020-10-11 MED ORDER — IPRATROPIUM-ALBUTEROL 0.5-2.5 (3) MG/3ML IN SOLN
3.0000 mL | Freq: Four times a day (QID) | RESPIRATORY_TRACT | Status: DC
  Administered 2020-10-11 – 2020-10-12 (×3): 3 mL via RESPIRATORY_TRACT
  Filled 2020-10-11 (×4): qty 3

## 2020-10-11 MED ORDER — IPRATROPIUM-ALBUTEROL 0.5-2.5 (3) MG/3ML IN SOLN: 3.0000 mL | Freq: Four times a day (QID) | RESPIRATORY_TRACT | Status: DC

## 2020-10-11 MED ORDER — METOPROLOL TARTRATE 25 MG PO TABS
25.0000 mg | ORAL_TABLET | Freq: Two times a day (BID) | ORAL | Status: DC
  Administered 2020-10-11 – 2020-10-13 (×5): 25 mg via ORAL
  Filled 2020-10-11 (×6): qty 1

## 2020-10-11 NOTE — Progress Notes (Signed)
   10/10/20 2013  Assess: MEWS Score  Temp (!) 97.4 F (36.3 C)  BP (!) 83/59  Pulse Rate (!) 107  Resp 18  SpO2 100 %  O2 Device Nasal Cannula  O2 Flow Rate (L/min) 4 L/min  Assess: MEWS Score  MEWS Temp 0  MEWS Systolic 1  MEWS Pulse 1  MEWS RR 0  MEWS LOC 0  MEWS Score 2  MEWS Score Color Yellow  Assess: if the MEWS score is Yellow or Red  Were vital signs taken at a resting state? Yes  Focused Assessment Change from prior assessment (see assessment flowsheet)  Early Detection of Sepsis Score *See Row Information* Medium  MEWS guidelines implemented *See Row Information* No, previously yellow, continue vital signs every 4 hours  Treat  MEWS Interventions Administered scheduled meds/treatments  Pain Scale 0-10  Pain Score Asleep  Take Vital Signs  Increase Vital Sign Frequency  Yellow: Q 2hr X 2 then Q 4hr X 2, if remains yellow, continue Q 4hrs  Escalate  MEWS: Escalate Yellow: discuss with charge nurse/RN and consider discussing with provider and RRT  Notify: Charge Nurse/RN  Name of Charge Nurse/RN Notified Levester Fresh, RN  Date Charge Nurse/RN Notified 10/10/20  Time Charge Nurse/RN Notified 2030   No change from previous vitals and assessment.  MD aware of situation from admission.  Patient is lethargic but arouses with verbal stimuli.  Will continue to monitor vitals.

## 2020-10-11 NOTE — Progress Notes (Signed)
PROGRESS NOTE    Brandi [name]   IRW:[id number]  DOB: 17-Mar-1951  DOA: 10/10/2020 [facility]CP: [facility] [place]   Brief Narrative:  Brandi [name] is a 70 y.o. female with medical history of Multiple myeloma s/p BMT, hyperthyroidism, right jugular DVT, HTN, diastolic CHF presents with dyspnea and a pulse ox of 84%. Placed on BiPAP and eventually weaned to 6 L [place]. She tells me that she has had shortness of breath, vomiting and diarrhea for a couple of days. Coughing up yellow sputum. Last episode of diarrhea and vomiting were today. No blood noted. No fever noted. She is currently not on any treatment for her MM. She is not on oxygen at baseline.   Subjective: She is breathing a little bit better today.  She had a episodes of diarrhea while in the ED yesterday but none since.  Her oral intake is poor.  Assessment & Plan:  Principal Problem:   Lobar pneumonia with severe sepsis and acute respiratory failure - Curb 65 is 3- severe risk for decompensation - cont Ceftriaxone and Azithromycin -One set of blood cultures is positive for streptococcal pneumonia Urine streptococcal antigen negative - dc azithromycin and use CTX to treat Strep pneumo  Active Problems: Vomiting and diarrhea elevated LFTs - PRN Zofran can be used- document all stools -  Advance to soft diet - follow LFTs (AST, ALT, T bili) which may be high due to sepsis- Alk phos chronically elevated  Lactic acidosis - improving  Acute thrombocytopenia - due to sepsis? following    IgA myeloma  - s/p treatment with BMT    Hypokalemia - has a h/o chronic hypokalemia and was also having diarrhea which would lead to losses - replaced - follow    Essential hypertension/  Chronic heart failure with preserved ejection fraction (HFpEF)   - BP low - hold antihypertensives and diuretics - tachycardic today as Toprol was held- BP improved so will start Metoprolol at 25 mg BID with holding  parameters  Hyperthyroidism/ Graves disease/ goiter - cont Methimazole  GERD - cont Protonix  Shingles - cont Acyclovir for prevention     Time spent in minutes: 35 DVT prophylaxis: enoxaparin (LOVENOX) injection 40 mg Start: 10/10/20 2000 Code Status: Full code Family Communication: daughter Level of Care: Level of care: Progressive Cardiac Disposition Plan:  Status is: Inpatient  Remains inpatient appropriate because:IV treatments appropriate due to intensity of illness or inability to take PO   Dispo: The patient is from: Home              Anticipated d/c is to: Home              Patient currently is not medically stable to d/c.   Difficult to place patient No      Consultants:   none Procedures:   none Antimicrobials:  Anti-infectives (From admission, onward)   Start     Dose/Rate Route Frequency Ordered Stop   10/11/20 1000  acyclovir (ZOVIRAX) 200 MG capsule 400 mg       Note to Pharmacy: TAKE 1 TABLET BY MOUTH ONCE DAILY TO  PREVENT  SHINGLES     400 mg Oral Daily 10/10/20 1730     10/10/20 1730  cefTRIAXone (ROCEPHIN) 2 g in sodium chloride 0.9 % 100 mL IVPB  Status:  Discontinued        2 g 200 mL/hr over 30 Minutes Intravenous Every 24 hours 10/10/20 1727 10/10/20 1736   10/10/20 1730  azithromycin (  ZITHROMAX) tablet 500 mg  Status:  Discontinued        500 mg Oral Daily 10/10/20 1727 10/10/20 1737   10/10/20 1415  cefTRIAXone (ROCEPHIN) 2 g in sodium chloride 0.9 % 100 mL IVPB        2 g 200 mL/hr over 30 Minutes Intravenous Every 24 hours 10/10/20 1401     10/10/20 1415  azithromycin (ZITHROMAX) 500 mg in sodium chloride 0.9 % 250 mL IVPB  Status:  Discontinued        500 mg 250 mL/hr over 60 Minutes Intravenous Every 24 hours 10/10/20 1401 10/11/20 0337       Objective: Vitals:   10/11/20 0736 10/11/20 0739 10/11/20 0753 10/11/20 1123  BP: 99/61   (!) 141/73  Pulse: (!) 106 100 (!) 109 (!) 138  Resp: _0 Temp: 97.6 F (36.4 C)    97.7 F (36.5 C)  TempSrc: Oral     SpO2: 98%  97% 100%  Weight:      Height:        Intake/Output Summary (Last 24 hours) at 10/11/2020 1314 Last data filed at 10/11/2020 1018 Gross per 24 hour  Intake 471.39 ml  Output 1100 ml  Net -628.61 ml   Filed Weights   10/10/20 1352 10/11/20 0609  Weight: 64.4 kg 65 kg    Examination: General exam: Appears comfortable  HEENT: PERRLA, oral mucosa moist, no sclera icterus or thrush Respiratory system: Clear to auscultation. Respiratory effort normal. Cardiovascular system: S1 & S2 heard, RRR.   Gastrointestinal system: Abdomen soft, non-tender, nondistended. Normal bowel sounds. Central nervous system: Alert and oriented. No focal neurological deficits. Extremities: No cyanosis, clubbing or edema Skin: No rashes or ulcers Psychiatry:  Mood & affect appropriate.     Data Reviewed: I have personally reviewed following labs and imaging studies  CBC: Recent Labs  Lab 10/10/20 1354 10/11/20 0509  WBC 7.2 11.7*  NEUTROABS 4.3  --   HGB 11.1* 9.8*  HCT 35.0* 30.6*  MCV 82.0 82.7  PLT 128* 709*   Basic Metabolic Panel: Recent Labs  Lab 10/10/20 1354 10/11/20 0509  NA 131* 139  K 3.3* 4.0  CL 102 113*  CO2 17* 18*  GLUCOSE 106* 96  BUN 22 20  CREATININE 0.73 0.52  CALCIUM 8.5* 8.6*   GFR: Estimated Creatinine Clearance: 59.6 mL/min (by C-G formula based on SCr of 0.52 mg/dL). Liver Function Tests: Recent Labs  Lab 10/10/20 1354  AST 110*  ALT 54*  ALKPHOS 221*  BILITOT 1.5*  PROT 6.8  ALBUMIN 3.4*   No results for input(s): LIPASE, AMYLASE in the last 168 hours. No results for input(s): AMMONIA in the last 168 hours. Coagulation Profile: No results for input(s): INR, PROTIME in the last 168 hours. Cardiac Enzymes: No results for input(s): CKTOTAL, CKMB, CKMBINDEX, TROPONINI in the last 168 hours. BNP (last 3 results) No results for input(s): PROBNP in the last 8760 hours. HbA1C: No results for input(s):  HGBA1C in the last 72 hours. CBG: No results for input(s): GLUCAP in the last 168 hours. Lipid Profile: No results for input(s): CHOL, HDL, LDLCALC, TRIG, CHOLHDL, LDLDIRECT in the last 72 hours. Thyroid Function Tests: No results for input(s): TSH, T4TOTAL, FREET4, T3FREE, THYROIDAB in the last 72 hours. Anemia Panel: No results for input(s): VITAMINB12, FOLATE, FERRITIN, TIBC, IRON, RETICCTPCT in the last 72 hours. Urine analysis:    Component Value Date/Time   COLORURINE YELLOW (A) 10/10/2020 1813   APPEARANCEUR  CLEAR (A) 10/10/2020 1813   LABSPEC 1.016 10/10/2020 1813   PHURINE 5.0 10/10/2020 1813   GLUCOSEU NEGATIVE 10/10/2020 1813   HGBUR MODERATE (A) 10/10/2020 1813   BILIRUBINUR NEGATIVE 10/10/2020 [place] 10/10/2020 1813   PROTEINUR NEGATIVE 10/10/2020 1813   NITRITE NEGATIVE 10/10/2020 1813   LEUKOCYTESUR NEGATIVE 10/10/2020 1813   Sepsis Labs: _0 (procalcitonin:4,lacticidven:4) ) Recent Results (from the past 240 hour(s))  Resp Panel by RT-PCR (Flu A&B, Covid) Nasopharyngeal Swab     Status: None   Collection Time: 10/10/20  2:06 PM   Specimen: Nasopharyngeal Swab; Nasopharyngeal(NP) swabs in vial transport medium  Result Value Ref Range Status   SARS Coronavirus 2 by RT PCR NEGATIVE NEGATIVE Final    Comment: (NOTE) SARS-CoV-2 target nucleic acids are NOT DETECTED.  The SARS-CoV-2 RNA is generally detectable in upper respiratory specimens during the acute phase of infection. The lowest concentration of SARS-CoV-2 viral copies this assay can detect is 138 copies/mL. A negative result does not preclude SARS-Cov-2 infection and should not be used as the sole basis for treatment or other patient management decisions. A negative result may occur with  improper specimen collection/handling, submission of specimen other than nasopharyngeal swab, presence of viral mutation(s) within the areas targeted by this assay, and inadequate number of  viral copies(<138 copies/mL). A negative result must be combined with clinical observations, patient history, and epidemiological information. The expected result is Negative.  Fact Sheet for Patients:  EntrepreneurPulse.com.au  Fact Sheet for Healthcare Providers:  IncredibleEmployment.be  This test is no t yet approved or cleared by the [place] FDA and  has been authorized for detection and/or diagnosis of SARS-CoV-2 by FDA under an Emergency Use Authorization (EUA). This EUA will remain  in effect (meaning this test can be used) for the duration of the COVID-19 declaration under Section 564(b)(1) of the Act, 21 U.S.C.section 360bbb-3(b)(1), unless the authorization is terminated  or revoked sooner.       Influenza A by PCR NEGATIVE NEGATIVE Final   Influenza B by PCR NEGATIVE NEGATIVE Final    Comment: (NOTE) The Xpert Xpress SARS-CoV-2/FLU/RSV plus assay is intended as an aid in the diagnosis of influenza from Nasopharyngeal swab specimens and should not be used as a sole basis for treatment. Nasal washings and aspirates are unacceptable for Xpert Xpress SARS-CoV-2/FLU/RSV testing.  Fact Sheet for Patients: EntrepreneurPulse.com.au  Fact Sheet for Healthcare Providers: IncredibleEmployment.be  This test is not yet approved or cleared by the [place] FDA and has been authorized for detection and/or diagnosis of SARS-CoV-2 by FDA under an Emergency Use Authorization (EUA). This EUA will remain in effect (meaning this test can be used) for the duration of the COVID-19 declaration under Section 564(b)(1) of the Act, 21 U.S.C. section 360bbb-3(b)(1), unless the authorization is terminated or revoked.  Performed at [facility], [address], [place], [place] [address]   Blood Culture (routine x 2)     Status: None (Preliminary result)   Collection Time: 10/10/20  2:06 PM    Specimen: BLOOD  Result Value Ref Range Status   Specimen Description   Final    BLOOD LEFT ANTECUBITAL Performed at [facility], [address] [place], [place] [address]    Special Requests   Final    BOTTLES DRAWN AEROBIC AND ANAEROBIC Blood Culture results may not be optimal due to an excessive volume of blood received in culture bottles Performed at [facility], [address], [place], [place]  [address]    Culture  Setup Time   Final    GRAM POSITIVE COCCI IN BOTH AEROBIC AND ANAEROBIC BOTTLES CRITICAL RESULT CALLED TO, READ BACK BY AND VERIFIED WITH: JASON ROBBINS AT 0329 10/11/20 MF. Performed at [facility], [place] [address], [place], [place] [address]    Culture GRAM POSITIVE COCCI  Final   Report Status PENDING  Incomplete  Blood Culture (routine x 2)     Status: None (Preliminary result)   Collection Time: 10/10/20  2:06 PM   Specimen: BLOOD  Result Value Ref Range Status   Specimen Description BLOOD RIGHT ANTECUBITAL  Final   Special Requests   Final    BOTTLES DRAWN AEROBIC AND ANAEROBIC Blood Culture results may not be optimal due to an excessive volume of blood received in culture bottles   Culture  Setup Time   Final    GRAM POSITIVE COCCI IN BOTH AEROBIC AND ANAEROBIC BOTTLES CRITICAL VALUE NOTED.  VALUE IS CONSISTENT WITH PREVIOUSLY REPORTED AND CALLED VALUE. Performed at [facility], [address] [place], [place] [address]    Culture [facility] POSITIVE COCCI  Final   Report Status PENDING  Incomplete  Blood Culture ID Panel (Reflexed)     Status: Abnormal   Collection Time: 10/10/20  2:06 PM  Result Value Ref Range Status   Enterococcus faecalis NOT DETECTED NOT DETECTED Final   Enterococcus Faecium NOT DETECTED NOT DETECTED Final   Listeria monocytogenes NOT DETECTED NOT DETECTED Final   Staphylococcus species NOT DETECTED NOT DETECTED Final   Staphylococcus aureus (BCID) NOT DETECTED NOT DETECTED Final    Staphylococcus epidermidis NOT DETECTED NOT DETECTED Final   Staphylococcus lugdunensis NOT DETECTED NOT DETECTED Final   Streptococcus species DETECTED (A) NOT DETECTED Final    Comment: CRITICAL RESULT CALLED TO, READ BACK BY AND VERIFIED WITH: JASSON ROBBINS AT 0329 10/11/20 MF    Streptococcus agalactiae NOT DETECTED NOT DETECTED Final   Streptococcus pneumoniae DETECTED (A) NOT DETECTED Final    Comment: CRITICAL RESULT CALLED TO, READ BACK BY AND VERIFIED WITH: JASON ROBBINS AT 0329 10/11/20 MF.    Streptococcus pyogenes NOT DETECTED NOT DETECTED Final   A.calcoaceticus-baumannii NOT DETECTED NOT DETECTED Final   Bacteroides fragilis NOT DETECTED NOT DETECTED Final   Enterobacterales NOT DETECTED NOT DETECTED Final   Enterobacter cloacae complex NOT DETECTED NOT DETECTED Final   Escherichia coli NOT DETECTED NOT DETECTED Final   Klebsiella aerogenes NOT DETECTED NOT DETECTED Final   Klebsiella oxytoca NOT DETECTED NOT DETECTED Final   Klebsiella pneumoniae NOT DETECTED NOT DETECTED Final   Proteus species NOT DETECTED NOT DETECTED Final   Salmonella species NOT DETECTED NOT DETECTED Final   Serratia marcescens NOT DETECTED NOT DETECTED Final   Haemophilus influenzae NOT DETECTED NOT DETECTED Final   Neisseria meningitidis NOT DETECTED NOT DETECTED Final   Pseudomonas aeruginosa NOT DETECTED NOT DETECTED Final   Stenotrophomonas maltophilia NOT DETECTED NOT DETECTED Final   Candida albicans NOT DETECTED NOT DETECTED Final   Candida auris NOT DETECTED NOT DETECTED Final   Candida glabrata NOT DETECTED NOT DETECTED Final   Candida krusei NOT DETECTED NOT DETECTED Final   Candida parapsilosis NOT DETECTED NOT DETECTED Final   Candida tropicalis NOT DETECTED NOT DETECTED Final   Cryptococcus neoformans/gattii NOT DETECTED NOT DETECTED Final    Comment: Performed at [facility], [address], [place], [place] [address]         Radiology Studies: CT Angio Chest PE  W and/or Wo Contrast  Result Date: 10/10/2020 CLINICAL DATA:  Respiratory distress. EXAM: CT ANGIOGRAPHY CHEST WITH CONTRAST TECHNIQUE: Multidetector CT imaging of the chest was performed using the standard protocol during bolus administration of intravenous contrast. Multiplanar CT image reconstructions and MIPs were obtained to evaluate the vascular anatomy. CONTRAST:  74mL OMNIPAQUE IOHEXOL 350 MG/ML SOLN COMPARISON:  05/24/2020 FINDINGS: Cardiovascular: Cardiomegaly, stable. Coronary artery and aortic atherosclerosis. No filling defects in the pulmonary arteries to suggest pulmonary emboli. Mediastinum/Nodes: No mediastinal, hilar, or axillary adenopathy. Thyroid markedly enlarged, stable since prior study. Lungs/Pleura: Dense consolidation in the left lower lobe concerning for pneumonia. No confluent opacity on the right. No effusions. Upper Abdomen: Imaging into the upper abdomen demonstrates no acute findings. Review of the MIP images confirms the above findings. IMPRESSION: Dense consolidation throughout the entire left lower lobe most compatible with pneumonia. No evidence of pulmonary embolus. Cardiomegaly, coronary artery disease. Aortic Atherosclerosis (ICD10-I70.0). Electronically Signed   By: Rolm Baptise M.D.   On: 10/10/2020 17:01   CT ABDOMEN PELVIS W CONTRAST  Result Date: 10/10/2020 CLINICAL DATA:  Abdominal distention EXAM: CT ABDOMEN AND PELVIS WITH CONTRAST TECHNIQUE: Multidetector CT imaging of the abdomen and pelvis was performed using the standard protocol following bolus administration of intravenous contrast. CONTRAST:  17mL OMNIPAQUE IOHEXOL 350 MG/ML SOLN COMPARISON:  04/05/2017 FINDINGS: Lower chest: Consolidation.  Seen in the visualized left lower lobe Hepatobiliary: No focal hepatic abnormality. Gallbladder unremarkable. Pancreas: No focal abnormality or ductal dilatation. Spleen: No focal abnormality.  Normal size. Adrenals/Urinary Tract: No adrenal abnormality. No focal renal  abnormality. No stones or hydronephrosis. Urinary bladder is unremarkable. Stomach/Bowel: Normal appendix. Stomach, large and small bowel grossly unremarkable. Vascular/Lymphatic: Heavily calcified aorta and iliac vessels. No evidence of aneurysm or adenopathy. Reproductive: Low-density centrally in the uterus could reflect endometrial thickening or central fibroid. Scattered calcifications in the uterus, likely calcified fibroids. No adnexal mass. Other: No free fluid or free air. Musculoskeletal: No acute bony abnormality. IMPRESSION: Heavily calcified aorta and iliac vessels. No acute findings in the abdomen or pelvis. Consolidation in the left lower lobe concerning for pneumonia. Central low-density in the uterus could reflect central fibroid or endometrial thickening. This could be further characterized with nonemergent pelvic ultrasound. Electronically Signed   By: Rolm Baptise M.D.   On: 10/10/2020 17:05   DG Chest Portable 1 View  Result Date: 10/10/2020 CLINICAL DATA:  Respiratory distress, hypoxia EXAM: PORTABLE CHEST 1 VIEW COMPARISON:  05/24/2020 chest radiograph. FINDINGS: Stable cardiomediastinal silhouette with top-normal heart size. No pneumothorax. No pleural effusion. Lungs appear clear, with no acute consolidative airspace disease and no pulmonary edema. IMPRESSION: No active disease. Electronically Signed   By: Ilona Sorrel M.D.   On: 10/10/2020 15:45      Scheduled Meds: . acyclovir  400 mg Oral Daily  . aspirin EC  81 mg Oral Daily  . enoxaparin (LOVENOX) injection  40 mg Subcutaneous Q24H  . feeding supplement  237 mL Oral BID BM  . fluticasone furoate-vilanterol  1 puff Inhalation Daily  . ipratropium-albuterol  3 mL Nebulization TID  . methimazole  20 mg Oral Daily  . mirtazapine  15 mg Oral QHS  . montelukast  10 mg Oral QHS  . pantoprazole  40 mg Oral Daily  . potassium chloride SA  20 mEq Oral BID  . sodium chloride flush  3 mL Intravenous Q12H  . [START ON  10/15/2020] Vitamin D (Ergocalciferol)  50,000 Units Oral Weekly   Continuous Infusions: .  sodium chloride    . cefTRIAXone (ROCEPHIN)  IV Stopped (10/10/20 1509)     LOS: 1 day      Debbe Odea, MD [facility] Pager: [email or web address] 10/11/2020, 1:14 PM

## 2020-10-11 NOTE — Progress Notes (Signed)
Mobility Specialist - Progress Note   10/11/20 1100  Mobility  Activity Ambulated in room  Range of Motion/Exercises Right leg;Left leg (AP, SLR, SM)  Level of Assistance Standby assist, set-up cues, supervision of patient - no hands on  Assistive Device None  Distance Ambulated (ft) 25 ft  Mobility Response Tolerated well  Mobility performed by Mobility specialist  $Mobility charge 1 Mobility    Pre-mobility: 124 HR, 100% SpO2 During mobility: 145 maxHR, 95% SpO2 Post-mobility: 130 HR, 98% SpO2   Pt lying in bed upon arrival. Educated on PLB and incremental movements. Participated in seated therex prior to OOB activity: ankle pumps, straight leg raises, seated march. Ambulated in room. Limited by SOB. Labored breathing noted. Pt stated that she had stopped using home O2 about 3 months ago, was on 4L at the time. Independent PTA. O2 >/= 95% throughout session on RA. Pt rated RPE a 1/10, stating the hardest part of the session was managing breathing. One short standing rest break taken during ambulation for tolerance. PLB utilized to lower HR. Pt did express she feels a lot stronger today than she did PTA, as she said her legs felt "like noodles" before. SBA for bed mobility and transfers.    Kathee Delton Mobility Specialist 10/11/20, 11:22 AM

## 2020-10-11 NOTE — Progress Notes (Signed)
Patient remains a yellow mews score throughout shift regarding increasing HR r/t sepsis from admitting diagnosis. MD aware and care team. BPs soft earlier during shift but are now WNL to continue different medications ordered. Patient asymptomatic of HR changes. Will inform oncoming nurse.

## 2020-10-11 NOTE — Progress Notes (Signed)
PHARMACY - PHYSICIAN COMMUNICATION CRITICAL VALUE ALERT - BLOOD CULTURE IDENTIFICATION (BCID)  Brandi [name] is an 70 y.o. female who presented to [facility] on 10/10/2020 with a chief complaint of PNA  Assessment:  Strep pneumo in 4 of 4 , no resistance.  (include suspected source if known)  Name of physician (or Provider) Contacted: Jonny Ruiz, NP   Current antibiotics: Azithromycin and Ceftriaxone   Changes to prescribed antibiotics recommended:  Recommendations accepted by provider  Will d/c azithromycin and continue with Ceftriaxone 2 gm IV Q24H.   No results found for this or any previous visit.  Quynh Basso D 10/11/2020  3:38 AM

## 2020-10-12 DIAGNOSIS — I499 Cardiac arrhythmia, unspecified: Secondary | ICD-10-CM

## 2020-10-12 DIAGNOSIS — A403 Sepsis due to Streptococcus pneumoniae: Principal | ICD-10-CM

## 2020-10-12 LAB — URINE CULTURE: Culture: <10000 — AB

## 2020-10-12 LAB — COMPREHENSIVE METABOLIC PANEL
ALT: 35 U/L (ref 0–44)
AST: 35 U/L (ref 15–41)
Albumin: 2.8 g/dL — ABNORMAL LOW (ref 3.5–5.0)
Alkaline Phosphatase: 124 U/L (ref 38–126)
Anion gap: 5 (ref 5–15)
BUN: 27 mg/dL — ABNORMAL HIGH (ref 8–23)
CO2: 17 mmol/L — ABNORMAL LOW (ref 22–32)
Calcium: 8.4 mg/dL — ABNORMAL LOW (ref 8.9–10.3)
Chloride: 116 mmol/L — ABNORMAL HIGH (ref 98–111)
Creatinine, Ser: 0.66 mg/dL (ref 0.44–1.00)
GFR, Estimated: >60 mL/min (ref >60)
Glucose, Bld: 156 mg/dL — ABNORMAL HIGH (ref 70–99)
Potassium: 4 mmol/L (ref 3.5–5.1)
Sodium: 138 mmol/L (ref 135–145)
Total Bilirubin: 0.5 mg/dL (ref 0.3–1.2)
Total Protein: 6 g/dL — ABNORMAL LOW (ref 6.5–8.1)

## 2020-10-12 LAB — CBC
HCT: 29.4 % — ABNORMAL LOW (ref 36.0–46.0)
Hemoglobin: 9.1 g/dL — ABNORMAL LOW (ref 12.0–15.0)
MCH: 25.7 pg — ABNORMAL LOW (ref 26.0–34.0)
MCHC: 31 g/dL (ref 30.0–36.0)
MCV: 83.1 fL (ref 80.0–100.0)
Platelets: 129 10*3/uL — ABNORMAL LOW (ref 150–400)
RBC: 3.54 MIL/uL — ABNORMAL LOW (ref 3.87–5.11)
RDW: 13.7 % (ref 11.5–15.5)
WBC: 13.3 10*3/uL — ABNORMAL HIGH (ref 4.0–10.5)
nRBC: 0.2 % (ref 0.0–0.2)

## 2020-10-12 MED ORDER — POTASSIUM CHLORIDE CRYS ER 20 MEQ PO TBCR
40.0000 meq | EXTENDED_RELEASE_TABLET | ORAL | Status: AC
  Administered 2020-10-12 (×2): 40 meq via ORAL
  Filled 2020-10-12 (×2): qty 2

## 2020-10-12 MED ORDER — POTASSIUM CHLORIDE CRYS ER 20 MEQ PO TBCR
40.0000 meq | EXTENDED_RELEASE_TABLET | Freq: Two times a day (BID) | ORAL | Status: DC
  Administered 2020-10-12 – 2020-10-13 (×2): 40 meq via ORAL
  Filled 2020-10-12 (×2): qty 2

## 2020-10-12 MED ORDER — IPRATROPIUM-ALBUTEROL 0.5-2.5 (3) MG/3ML IN SOLN
3.0000 mL | Freq: Three times a day (TID) | RESPIRATORY_TRACT | Status: DC
  Administered 2020-10-12 – 2020-10-13 (×3): 3 mL via RESPIRATORY_TRACT
  Filled 2020-10-12 (×3): qty 3

## 2020-10-12 NOTE — Progress Notes (Signed)
PROGRESS NOTE    Brandi [name]   TDD:[id number]  DOB: 11-13-1950  DOA: 10/10/2020 PCP: [facility] [place]   Brief Narrative:  Brandi [name] is a 70 y.o. female with medical history of Multiple myeloma s/p BMT, hyperthyroidism, right jugular DVT, HTN, diastolic CHF presents with dyspnea and a pulse ox of 84%. Placed on BiPAP and eventually weaned to 6 L [place]. She tells me that she has had shortness of breath, vomiting and diarrhea for a couple of days. Coughing up yellow sputum. Last episode of diarrhea and vomiting were today. No blood noted. No fever noted. She is currently not on any treatment for her MM. She is not on oxygen at baseline.   Subjective: No complaints today.   Assessment & Plan:  Principal Problem:   Lobar pneumonia with severe sepsis and acute respiratory failure - Curb 65 is 3- severe risk for decompensation - cont Ceftriaxone and Azithromycin -One set of blood cultures is positive for streptococcal pneumonia Urine streptococcal antigen negative - dc azithromycin and use CTX to treat Strep pneumo - WBC is up today- recheck tomorrow- continue current antibiotics- sensitivities are still pending  Active Problems: Vomiting and diarrhea elevated LFTs - PRN Zofran can be used-   -  Advance to soft diet - follow LFTs (AST, ALT, T bili) which may be high due to sepsis- slightly better today - Alk phos chronically elevated - no diarrhea yesterday- beginning to eat more  Lactic acidosis - improving  Acute thrombocytopenia - due to sepsis? following    IgA myeloma  - s/p treatment with BMT    Hypokalemia - has a h/o chronic hypokalemia and was also having diarrhea which would lead to losses - replace again today - follow    Essential hypertension/  Chronic heart failure with preserved ejection fraction (HFpEF)   - BP low - hold antihypertensives and diuretics - tachycardic today as Toprol was held- BP improved so will start Metoprolol at  25 mg BID with holding parameters  Hyperthyroidism/ Graves disease/ goiter - cont Methimazole  GERD - cont Protonix  Shingles - cont Acyclovir for prevention     Time spent in minutes: 35 DVT prophylaxis: enoxaparin (LOVENOX) injection 40 mg Start: 10/10/20 2000 Code Status: Full code Family Communication: daughter Level of Care: Level of care: Progressive Cardiac Disposition Plan:  Status is: Inpatient  Remains inpatient appropriate because:IV treatments appropriate due to intensity of illness or inability to take PO   Dispo: The patient is from: Home              Anticipated d/c is to: Home              Patient currently is not medically stable to d/c.   Difficult to place patient No   Consultants:   none Procedures:   none Antimicrobials:  Anti-infectives (From admission, onward)   Start     Dose/Rate Route Frequency Ordered Stop   10/11/20 1000  acyclovir (ZOVIRAX) 200 MG capsule 400 mg       Note to Pharmacy: TAKE 1 TABLET BY MOUTH ONCE DAILY TO  PREVENT  SHINGLES     400 mg Oral Daily 10/10/20 1730     10/10/20 1730  cefTRIAXone (ROCEPHIN) 2 g in sodium chloride 0.9 % 100 mL IVPB  Status:  Discontinued        2 g 200 mL/hr over 30 Minutes Intravenous Every 24 hours 10/10/20 1727 10/10/20 1736   10/10/20 1730  azithromycin (ZITHROMAX)  tablet 500 mg  Status:  Discontinued        500 mg Oral Daily 10/10/20 1727 10/10/20 1737   10/10/20 1415  cefTRIAXone (ROCEPHIN) 2 g in sodium chloride 0.9 % 100 mL IVPB        2 g 200 mL/hr over 30 Minutes Intravenous Every 24 hours 10/10/20 1401     10/10/20 1415  azithromycin (ZITHROMAX) 500 mg in sodium chloride 0.9 % 250 mL IVPB  Status:  Discontinued        500 mg 250 mL/hr over 60 Minutes Intravenous Every 24 hours 10/10/20 1401 10/11/20 0337       Objective: Vitals:   10/12/20 0842 10/12/20 1133 10/12/20 1135 10/12/20 1219  BP:  128/71    Pulse: (!) 116 (!) 116 (!) 109   Resp: 20 18    Temp:  97.7 F (36.5  C)    TempSrc:      SpO2: 96% 99%  97%  Weight:      Height:        Intake/Output Summary (Last 24 hours) at 10/12/2020 1324 Last data filed at 10/12/2020 1132 Gross per 24 hour  Intake 222.15 ml  Output 150 ml  Net 72.15 ml   Filed Weights   10/10/20 1352 10/11/20 0609  Weight: 64.4 kg 65 kg    Examination: General exam: Appears comfortable  HEENT: PERRLA, oral mucosa moist, no sclera icterus or thrush Respiratory system: Clear to auscultation. Respiratory effort normal. Cardiovascular system: S1 & S2 heard, regular rate and rhythm Gastrointestinal system: Abdomen soft, non-tender, nondistended. Normal bowel sounds   Central nervous system: Alert and oriented. No focal neurological deficits. Extremities: No cyanosis, clubbing or edema Skin: No rashes or ulcers Psychiatry:  Mood & affect appropriate.    Data Reviewed: I have personally reviewed following labs and imaging studies  CBC: Recent Labs  Lab 10/10/20 1354 10/11/20 0509 10/12/20 0501  WBC 7.2 11.7* 13.3*  NEUTROABS 4.3  --   --   HGB 11.1* 9.8* 9.1*  HCT 35.0* 30.6* 29.4*  MCV 82.0 82.7 83.1  PLT 128* 114* 676*   Basic Metabolic Panel: Recent Labs  Lab 10/10/20 1354 10/11/20 0509 10/11/20 1340  NA 131* 139 135  K 3.3* 4.0 3.4*  CL 102 113* 109  CO2 17* 18* 16*  GLUCOSE 106* 96 188*  BUN '22 20 23  ' CREATININE 0.73 0.52 0.57  CALCIUM 8.5* 8.6* 8.7*   GFR: Estimated Creatinine Clearance: 59.6 mL/min (by C-G formula based on SCr of 0.57 mg/dL). Liver Function Tests: Recent Labs  Lab 10/10/20 1354 10/11/20 1340  AST 110* 66*  ALT 54* 50*  ALKPHOS 221* 137*  BILITOT 1.5* 0.7  PROT 6.8 6.1*  ALBUMIN 3.4* 2.9*   No results for input(s): LIPASE, AMYLASE in the last 168 hours. No results for input(s): AMMONIA in the last 168 hours. Coagulation Profile: No results for input(s): INR, PROTIME in the last 168 hours. Cardiac Enzymes: No results for input(s): CKTOTAL, CKMB, CKMBINDEX, TROPONINI  in the last 168 hours. BNP (last 3 results) No results for input(s): PROBNP in the last 8760 hours. HbA1C: No results for input(s): HGBA1C in the last 72 hours. CBG: No results for input(s): GLUCAP in the last 168 hours. Lipid Profile: No results for input(s): CHOL, HDL, LDLCALC, TRIG, CHOLHDL, LDLDIRECT in the last 72 hours. Thyroid Function Tests: No results for input(s): TSH, T4TOTAL, FREET4, T3FREE, THYROIDAB in the last 72 hours. Anemia Panel: No results for input(s): VITAMINB12, FOLATE, FERRITIN, TIBC,  IRON, RETICCTPCT in the last 72 hours. Urine analysis:    Component Value Date/Time   COLORURINE YELLOW (A) 10/10/2020 1813   APPEARANCEUR CLEAR (A) 10/10/2020 1813   LABSPEC 1.016 10/10/2020 1813   PHURINE 5.0 10/10/2020 1813   GLUCOSEU NEGATIVE 10/10/2020 1813   HGBUR MODERATE (A) 10/10/2020 1813   BILIRUBINUR NEGATIVE 10/10/2020 1813   KETONESUR NEGATIVE 10/10/2020 1813   PROTEINUR NEGATIVE 10/10/2020 1813   NITRITE NEGATIVE 10/10/2020 1813   LEUKOCYTESUR NEGATIVE 10/10/2020 1813   Sepsis Labs: '@LABRCNTIP' (procalcitonin:4,lacticidven:4) ) Recent Results (from the past 240 hour(s))  Resp Panel by RT-PCR (Flu A&B, Covid) Nasopharyngeal Swab     Status: None   Collection Time: 10/10/20  2:06 PM   Specimen: Nasopharyngeal Swab; Nasopharyngeal(NP) swabs in vial transport medium  Result Value Ref Range Status   SARS Coronavirus 2 by RT PCR NEGATIVE NEGATIVE Final    Comment: (NOTE) SARS-CoV-2 target nucleic acids are NOT DETECTED.  The SARS-CoV-2 RNA is generally detectable in upper respiratory specimens during the acute phase of infection. The lowest concentration of SARS-CoV-2 viral copies this assay can detect is 138 copies/mL. A negative result does not preclude SARS-Cov-2 infection and should not be used as the sole basis for treatment or other patient management decisions. A negative result may occur with  improper specimen collection/handling, submission of  specimen other than nasopharyngeal swab, presence of viral mutation(s) within the areas targeted by this assay, and inadequate number of viral copies(<138 copies/mL). A negative result must be combined with clinical observations, patient history, and epidemiological information. The expected result is Negative.  Fact Sheet for Patients:  EntrepreneurPulse.com.au  Fact Sheet for Healthcare Providers:  IncredibleEmployment.be  This test is no t yet approved or cleared by the [place] FDA and  has been authorized for detection and/or diagnosis of SARS-CoV-2 by FDA under an Emergency Use Authorization (EUA). This EUA will remain  in effect (meaning this test can be used) for the duration of the COVID-19 declaration under Section 564(b)(1) of the Act, 21 U.S.C.section 360bbb-3(b)(1), unless the authorization is terminated  or revoked sooner.       Influenza A by PCR NEGATIVE NEGATIVE Final   Influenza B by PCR NEGATIVE NEGATIVE Final    Comment: (NOTE) The Xpert Xpress SARS-CoV-2/FLU/RSV plus assay is intended as an aid in the diagnosis of influenza from Nasopharyngeal swab specimens and should not be used as a sole basis for treatment. Nasal washings and aspirates are unacceptable for Xpert Xpress SARS-CoV-2/FLU/RSV testing.  Fact Sheet for Patients: EntrepreneurPulse.com.au  Fact Sheet for Healthcare Providers: IncredibleEmployment.be  This test is not yet approved or cleared by the [place] FDA and has been authorized for detection and/or diagnosis of SARS-CoV-2 by FDA under an Emergency Use Authorization (EUA). This EUA will remain in effect (meaning this test can be used) for the duration of the COVID-19 declaration under Section 564(b)(1) of the Act, 21 U.S.C. section 360bbb-3(b)(1), unless the authorization is terminated or revoked.  Performed at [facility], [address] [place], [place] [address]   Blood Culture (routine x 2)     Status: Abnormal (Preliminary result)   Collection Time: 10/10/20  2:06 PM   Specimen: BLOOD  Result Value Ref Range Status   Specimen Description   Final    BLOOD LEFT ANTECUBITAL Performed at [facility], [address], [place], [place] [address]    Special Requests   Final    BOTTLES DRAWN AEROBIC AND ANAEROBIC Blood Culture results may  not be optimal due to an excessive volume of blood received in culture bottles Performed at [facility], [address] [place], [place] [address]    Culture  Setup Time   Final    GRAM POSITIVE COCCI IN BOTH AEROBIC AND ANAEROBIC BOTTLES CRITICAL RESULT CALLED TO, READ BACK BY AND VERIFIED WITH: JASON ROBBINS AT 8891 10/11/20 MF.    Culture (A)  Final    STREPTOCOCCUS PNEUMONIAE SUSCEPTIBILITIES TO FOLLOW Performed at [facility], [place] [address], [place], [place] [address]    Report Status PENDING  Incomplete  Blood Culture (routine x 2)     Status: None (Preliminary result)   Collection Time: 10/10/20  2:06 PM   Specimen: BLOOD  Result Value Ref Range Status   Specimen Description   Final    BLOOD RIGHT ANTECUBITAL Performed at [facility], [address] [place], [place] [address]    Special Requests   Final    BOTTLES DRAWN AEROBIC AND ANAEROBIC Blood Culture results may not be optimal due to an excessive volume of blood received in culture bottles Performed at [facility], [address] [place], [place] [address]    Culture  Setup Time   Final    GRAM POSITIVE COCCI IN BOTH AEROBIC AND ANAEROBIC BOTTLES CRITICAL VALUE NOTED.  VALUE IS CONSISTENT WITH PREVIOUSLY REPORTED AND CALLED VALUE. Performed at [facility], [address] [place], [place] [address]    Culture   Final    Lonell Grandchild POSITIVE COCCI IDENTIFICATION TO FOLLOW Performed at [facility], [place] [address] [place],  [address]     Report Status PENDING  Incomplete  Blood Culture ID Panel (Reflexed)     Status: Abnormal   Collection Time: 10/10/20  2:06 PM  Result Value Ref Range Status   Enterococcus faecalis NOT DETECTED NOT DETECTED Final   Enterococcus Faecium NOT DETECTED NOT DETECTED Final   Listeria monocytogenes NOT DETECTED NOT DETECTED Final   Staphylococcus species NOT DETECTED NOT DETECTED Final   Staphylococcus aureus (BCID) NOT DETECTED NOT DETECTED Final   Staphylococcus epidermidis NOT DETECTED NOT DETECTED Final   Staphylococcus lugdunensis NOT DETECTED NOT DETECTED Final   Streptococcus species DETECTED (A) NOT DETECTED Final    Comment: CRITICAL RESULT CALLED TO, READ BACK BY AND VERIFIED WITH: JASSON ROBBINS AT 0329 10/11/20 MF    Streptococcus agalactiae NOT DETECTED NOT DETECTED Final   Streptococcus pneumoniae DETECTED (A) NOT DETECTED Final    Comment: CRITICAL RESULT CALLED TO, READ BACK BY AND VERIFIED WITH: JASON ROBBINS AT 0329 10/11/20 MF.    Streptococcus pyogenes NOT DETECTED NOT DETECTED Final   A.calcoaceticus-baumannii NOT DETECTED NOT DETECTED Final   Bacteroides fragilis NOT DETECTED NOT DETECTED Final   Enterobacterales NOT DETECTED NOT DETECTED Final   Enterobacter cloacae complex NOT DETECTED NOT DETECTED Final   Escherichia coli NOT DETECTED NOT DETECTED Final   Klebsiella aerogenes NOT DETECTED NOT DETECTED Final   Klebsiella oxytoca NOT DETECTED NOT DETECTED Final   Klebsiella pneumoniae NOT DETECTED NOT DETECTED Final   Proteus species NOT DETECTED NOT DETECTED Final   Salmonella species NOT DETECTED NOT DETECTED Final   Serratia marcescens NOT DETECTED NOT DETECTED Final   Haemophilus influenzae NOT DETECTED NOT DETECTED Final   Neisseria meningitidis NOT DETECTED NOT DETECTED Final   Pseudomonas aeruginosa NOT DETECTED NOT DETECTED Final   Stenotrophomonas maltophilia NOT DETECTED NOT DETECTED Final   Candida albicans NOT DETECTED NOT DETECTED Final  Candida auris  NOT DETECTED NOT DETECTED Final   Candida glabrata NOT DETECTED NOT DETECTED Final   Candida krusei NOT DETECTED NOT DETECTED Final   Candida parapsilosis NOT DETECTED NOT DETECTED Final   Candida tropicalis NOT DETECTED NOT DETECTED Final   Cryptococcus neoformans/gattii NOT DETECTED NOT DETECTED Final    Comment: Performed at [facility], [address] [place], [place] [address]  Urine culture     Status: Abnormal   Collection Time: 10/10/20  6:13 PM   Specimen: Urine, Random  Result Value Ref Range Status   Specimen Description   Final    URINE, RANDOM Performed at [facility], [address] [place], [place] [address]    Special Requests   Final    NONE Performed at [facility], [address], [place], [place] [address]    Culture (A)  Final    <10,000 COLONIES/mL INSIGNIFICANT GROWTH Performed at [facility] [address], [place], [place] [address]    Report Status 10/12/2020 FINAL  Final         Radiology Studies: CT Angio Chest PE W and/or Wo Contrast  Result Date: 10/10/2020 CLINICAL DATA:  Respiratory distress. EXAM: CT ANGIOGRAPHY CHEST WITH CONTRAST TECHNIQUE: Multidetector CT imaging of the chest was performed using the standard protocol during bolus administration of intravenous contrast. Multiplanar CT image reconstructions and MIPs were obtained to evaluate the vascular anatomy. CONTRAST:  48m OMNIPAQUE IOHEXOL 350 MG/ML SOLN COMPARISON:  05/24/2020 FINDINGS: Cardiovascular: Cardiomegaly, stable. Coronary artery and aortic atherosclerosis. No filling defects in the pulmonary arteries to suggest pulmonary emboli. Mediastinum/Nodes: No mediastinal, hilar, or axillary adenopathy. Thyroid markedly enlarged, stable since prior study. Lungs/Pleura: Dense consolidation in the left lower lobe concerning for pneumonia. No confluent opacity on the right. No effusions. Upper Abdomen: Imaging into the upper abdomen demonstrates no  acute findings. Review of the MIP images confirms the above findings. IMPRESSION: Dense consolidation throughout the entire left lower lobe most compatible with pneumonia. No evidence of pulmonary embolus. Cardiomegaly, coronary artery disease. Aortic Atherosclerosis (ICD10-I70.0). Electronically Signed   By: KRolm BaptiseM.D.   On: 10/10/2020 17:01   CT ABDOMEN PELVIS W CONTRAST  Result Date: 10/10/2020 CLINICAL DATA:  Abdominal distention EXAM: CT ABDOMEN AND PELVIS WITH CONTRAST TECHNIQUE: Multidetector CT imaging of the abdomen and pelvis was performed using the standard protocol following bolus administration of intravenous contrast. CONTRAST:  721mOMNIPAQUE IOHEXOL 350 MG/ML SOLN COMPARISON:  04/05/2017 FINDINGS: Lower chest: Consolidation.  Seen in the visualized left lower lobe Hepatobiliary: No focal hepatic abnormality. Gallbladder unremarkable. Pancreas: No focal abnormality or ductal dilatation. Spleen: No focal abnormality.  Normal size. Adrenals/Urinary Tract: No adrenal abnormality. No focal renal abnormality. No stones or hydronephrosis. Urinary bladder is unremarkable. Stomach/Bowel: Normal appendix. Stomach, large and small bowel grossly unremarkable. Vascular/Lymphatic: Heavily calcified aorta and iliac vessels. No evidence of aneurysm or adenopathy. Reproductive: Low-density centrally in the uterus could reflect endometrial thickening or central fibroid. Scattered calcifications in the uterus, likely calcified fibroids. No adnexal mass. Other: No free fluid or free air. Musculoskeletal: No acute bony abnormality. IMPRESSION: Heavily calcified aorta and iliac vessels. No acute findings in the abdomen or pelvis. Consolidation in the left lower lobe concerning for pneumonia. Central low-density in the uterus could reflect central fibroid or endometrial thickening. This could be further characterized with nonemergent pelvic ultrasound. Electronically Signed   By: KeRolm Baptise.D.   On:  10/10/2020 17:05   DG Chest Portable 1  View  Result Date: 10/10/2020 CLINICAL DATA:  Respiratory distress, hypoxia EXAM: PORTABLE CHEST 1 VIEW COMPARISON:  05/24/2020 chest radiograph. FINDINGS: Stable cardiomediastinal silhouette with top-normal heart size. No pneumothorax. No pleural effusion. Lungs appear clear, with no acute consolidative airspace disease and no pulmonary edema. IMPRESSION: No active disease. Electronically Signed   By: Ilona Sorrel M.D.   On: 10/10/2020 15:45      Scheduled Meds: . acyclovir  400 mg Oral Daily  . aspirin EC  81 mg Oral Daily  . enoxaparin (LOVENOX) injection  40 mg Subcutaneous Q24H  . feeding supplement  237 mL Oral BID BM  . fluticasone furoate-vilanterol  1 puff Inhalation Daily  . ipratropium-albuterol  3 mL Nebulization TID  . methimazole  20 mg Oral Daily  . metoprolol tartrate  25 mg Oral BID  . mirtazapine  15 mg Oral QHS  . montelukast  10 mg Oral QHS  . pantoprazole  40 mg Oral Daily  . potassium chloride SA  20 mEq Oral BID  . potassium chloride  40 mEq Oral Q4H  . sodium chloride flush  3 mL Intravenous Q12H  . [START ON 10/15/2020] Vitamin D (Ergocalciferol)  50,000 Units Oral Weekly   Continuous Infusions: . sodium chloride    . cefTRIAXone (ROCEPHIN)  IV Stopped (10/11/20 1547)     LOS: 2 days      Debbe Odea, MD [facility] Pager: [email or web address] 10/12/2020, 1:24 PM

## 2020-10-12 NOTE — Evaluation (Signed)
Physical Therapy Evaluation Patient Details Name: Brandi [name] MRN: [id number] DOB: [birth year] Today's Date: 10/12/2020   History of Present Illness  Pt is a 70 y/o female presents to the hospital with SOB, vomiting and diarrhea. Pt found to have lobar pneumonia with severe sepsis and acute respiratory failure. PMH: mulitple myeloma s/p BMT, hyperthroidism, right jugular DVT, HTN, diastolic CHF with dyspnea.  Clinical Impression  Pt received by PT supine in the bed with HOB elevated. Pt agreed to participate with PT interventions this morning. She was able to demonstrate safe bed mobility with HOB elevated and use of the bed railings with supervision from therapist. Completed sit<>stand transfer with CGA, VCs for sequencing and hand placement. Completed ambulation for 12ft with SpO2 maintaining >94% throughout interventions. Pt demonstrated mild fatigue with ambulation and required minimal assistance to help with RW management when completing turns in the hall. Pt ambulated to bathroom using RW and CGA for safety, returned to bedside to complete therapeutic exercises including: standing marches x20 and standing heel raises x15. Pt's bedding was soiled, changed bedding and assisted pt back into bed. Pt left with cell phone and phone in reach with friend at bedside.Pt tolerated ambulation and therapeutic interventions well today with 2/10 fatigue reported. Pt will continue to benefit from skilled PT interventions to address endurance and strength deficits to return pt to baseline functional capacity.     Follow Up Recommendations Home health PT    Equipment Recommendations       Recommendations for Other Services       Precautions / Restrictions Precautions Precautions: None Restrictions Weight Bearing Restrictions: No      Mobility  Bed Mobility Overal bed mobility: Modified Independent Bed Mobility: Supine to Sit     Supine to sit: Supervision     General bed mobility  comments: Pt able to complete bed mobility with use of railings and HOB elevated Patient Response: Cooperative  Transfers Overall transfer level: Needs assistance Equipment used: Standard walker Transfers: Sit to/from Stand Sit to Stand: Min guard         General transfer comment: Pt able to complete sit<>stand trasnfer with CGA, VCs provided to pt for appropriate hand placement  Ambulation/Gait Ambulation/Gait assistance: Min guard Gait Distance (Feet): 60 Feet Assistive device: Standard walker Gait Pattern/deviations: Shuffle;Trunk flexed Gait velocity: Decreased   General Gait Details: Pt demonstrated forward trunk lean with gait and slow velocity, minimal A needed to manage RW when completing turns for safety  Stairs            Wheelchair Mobility    Modified Rankin (Stroke Patients Only)       Balance Overall balance assessment: Needs assistance Sitting-balance support: Feet supported Sitting balance-Leahy Scale: Normal Sitting balance - Comments: Pt demonstrated good seated balance with BIL foot support     Standing balance-Leahy Scale: Good Standing balance comment: Pt demonstrated safe standing balance with RW UE support                             Pertinent Vitals/Pain Pain Assessment: No/denies pain    Home Living Family/patient expects to be discharged to:: Private residence Living Arrangements: Other relatives (Pt reports that her son lives with her intermittently) Available Help at Discharge: Family (Dtr and wife available to assist with pts needs) Type of Home: House Home Access: Stairs to enter Entrance Stairs-Rails: None Entrance Stairs-Number of Steps: 3 Home Layout: One level   Additional Comments:  Pt reports that she does not currently use any AD    Prior Function Level of Independence: Independent         Comments: Pt reports that she lives by herself most of the time, but her son lives with her intermittently and  she watches her 49 yo grandson regularly     Hand Dominance        Extremity/Trunk Assessment   Upper Extremity Assessment Upper Extremity Assessment: Overall WFL for tasks assessed    Lower Extremity Assessment Lower Extremity Assessment: Generalized weakness       Communication   Communication: No difficulties  Cognition Arousal/Alertness: Awake/alert Behavior During Therapy: WFL for tasks assessed/performed Overall Cognitive Status: Within Functional Limits for tasks assessed                                        General Comments General comments (skin integrity, edema, etc.): Pt demonstrated some increased fatigue following gait and prolonged standing during session to change bedding and assist with toileting hygiene    Exercises Total Joint Exercises Marching in Standing: Strengthening;Both;15 reps Other Exercises Other Exercises: Heel raises x15   Assessment/Plan    PT Assessment Patient needs continued PT services  PT Problem List Decreased strength;Decreased mobility;Decreased activity tolerance;Cardiopulmonary status limiting activity       PT Treatment Interventions DME instruction;Therapeutic exercise;Gait training;Balance training;Stair training;Functional mobility training;Therapeutic activities;Patient/family education    PT Goals (Current goals can be found in the Care Plan section)  Acute Rehab PT Goals Patient Stated Goal: Go home PT Goal Formulation: With patient Time For Goal Achievement: 10/26/20 Potential to Achieve Goals: Good    Frequency Min 2X/week   Barriers to discharge        Co-evaluation               AM-PAC PT "6 Clicks" Mobility  Outcome Measure Help needed turning from your back to your side while in a flat bed without using bedrails?: None Help needed moving from lying on your back to sitting on the side of a flat bed without using bedrails?: None Help needed moving to and from a bed to a chair  (including a wheelchair)?: A Little Help needed standing up from a chair using your arms (e.g., wheelchair or bedside chair)?: A Little Help needed to walk in hospital room?: A Little Help needed climbing 3-5 steps with a railing? : A Lot 6 Click Score: 19    End of Session Equipment Utilized During Treatment: Gait belt Activity Tolerance: Patient tolerated treatment well Patient left: in bed;with call bell/phone within reach;with family/visitor present   PT Visit Diagnosis: Unsteadiness on feet (R26.81);Other abnormalities of gait and mobility (R26.89)    Time: 9518-8416 PT Time Calculation (min) (ACUTE ONLY): 39 min   Charges:   PT Evaluation $PT Eval Low Complexity: 1 Low PT Treatments $Gait Training: 8-22 mins $Therapeutic Activity: 8-22 mins        Duanne Guess, PT, DPT 10/12/20, 12:44 PM   Isaias Cowman 10/12/2020, 12:36 PM

## 2020-10-13 LAB — CULTURE, BLOOD (ROUTINE X 2)

## 2020-10-13 LAB — CBC
HCT: 29.9 % — ABNORMAL LOW (ref 36.0–46.0)
Hemoglobin: 9.4 g/dL — ABNORMAL LOW (ref 12.0–15.0)
MCH: 26 pg (ref 26.0–34.0)
MCHC: 31.4 g/dL (ref 30.0–36.0)
MCV: 82.6 fL (ref 80.0–100.0)
Platelets: 146 10*3/uL — ABNORMAL LOW (ref 150–400)
RBC: 3.62 MIL/uL — ABNORMAL LOW (ref 3.87–5.11)
RDW: 14.1 % (ref 11.5–15.5)
WBC: 12.4 10*3/uL — ABNORMAL HIGH (ref 4.0–10.5)
nRBC: 0.4 % — ABNORMAL HIGH (ref 0.0–0.2)

## 2020-10-13 MED ORDER — AMOXICILLIN 500 MG PO CAPS
500.0000 mg | ORAL_CAPSULE | Freq: Three times a day (TID) | ORAL | Status: DC
  Administered 2020-10-13: 500 mg via ORAL
  Filled 2020-10-13 (×2): qty 1

## 2020-10-13 MED ORDER — AMOXICILLIN 500 MG PO CAPS: 500.0000 mg | ORAL_CAPSULE | Freq: Three times a day (TID) | ORAL | 0 refills | Status: DC

## 2020-10-13 NOTE — Care Management Important Message (Signed)
Important Message  Patient Details  Name: Brandi [name] MRN: [id number] Date of Birth: 06-Apr-1951   Medicare Important Message Given:  No  Patient discharged prior to arrival to unit to deliver concurrent Medicare IM.  Initial Medicare IM reviewed with patient on 10/11/2020 at 2:10pm by Wendall Stade, Patient Access Associate.     Dannette Barbara 10/13/2020, 12:11 PM

## 2020-10-13 NOTE — Progress Notes (Signed)
Mobility Specialist - Progress Note   10/13/20 1100  Mobility  Activity Ambulated in hall  Level of Assistance Modified independent, requires aide device or extra time  Assistive Device Front wheel walker  Distance Ambulated (ft) 100 ft  Mobility Response Tolerated well  Mobility performed by Mobility specialist  $Mobility charge 1 Mobility    Pre-mobility: 98 HR, 100% SpO2 During mobility: 115 maxHR, 95% SpO2 Post-mobility: 95 HR, 98% SpO2   Pt doffed [place] prior to activity. Pt ambulated in hallway with RW on RA. No LOB noted. Mild SOB, PLB utilized. O2 > 95% throughout session. VC to look forward for increased safety awareness. RPE 8/10. ModI. Family at bedside.   Kathee Delton Mobility Specialist 10/13/20, 11:05 AM

## 2020-10-13 NOTE — Discharge Summary (Signed)
Physician Discharge Summary  JELITZA MANNINEN RDE:[id number] DOB: 1951/01/20 DOA: 10/10/2020  [facility] date: 10/10/2020 Discharge date: 10/14/2020  Admitted From: Home  Disposition: Home  Recommendations for Outpatient Follow-up:  1. Follow-up  platelets as outpatient  Home Health: Resumed Discharge Condition: Stable CODE STATUS: Full code Diet recommendation: Heart healthy Consultations:  None Procedures/Studies: . None   Discharge Diagnoses:  Principal Problem:   Lobar pneumonia with severe sepsis and acute respiratory failure Active Problems: Vomiting and diarrhea possibly secondary to pneumonia Acute thrombocytopenia   IgA myeloma in remission   Hypokalemia   Essential hypertension   Chronic heart failure with preserved ejection fraction (HFpEF) ([place])      Brief Summary: ARYELLA BESECKER is a 70 y.o.femalewith medical history ofMultiple myeloma s/p BMT, hyperthyroidism, right jugular DVT, HTN, diastolic CHF presents with dyspnea and a pulse ox of 84%. Placed on BiPAP and eventually weaned to 6 L [place].She tells me that she has had shortness of breath, vomiting and diarrhea for a couple of days. Coughing up yellow sputum. Last episode of diarrhea and vomiting were today. No blood noted. No fever noted. She is currently not on any treatment for her MM. She is not on oxygen at baseline.  Hospital Course:  Principal Problem: Lobar pneumonia with severe sepsis and acute respiratory failure - Curb 65 is 3- severe risk for decompensation - cont Ceftriaxone and Azithromycin -One set of blood cultures is positive for streptococcal pneumonia Urine streptococcal antigen negative - dc azithromycin and use CTX to treat Strep pneumo - sensitivities resulted and found to be sensitive to CTX and PCN- will transition to Amoxil today.  Active Problems: Vomiting and diarrhea elevated LFTs - PRN Zofran can be used-   -  Advance to soft  diet - follow LFTs (AST, ALT, T bili) which may be high due to sepsis- slightly better today - Alk phos chronically elevated - no diarrhea yesterday- beginning to eat more  Lactic acidosis - improving  Acute thrombocytopenia - due to sepsis? following  IgA myeloma  - s/p treatment with BMT  Hypokalemia - has a h/o chronic hypokalemia and was also having diarrhea which would lead to losses - replace again today - follow  Essential hypertension/Chronic heart failure with preserved ejection fraction (HFpEF)  - BP low - hold antihypertensives and diuretics - tachycardic today as Toprol was held- BP improved so will start Metoprolol at 25 mg BID with holding parameters  Hyperthyroidism/ Graves disease/ goiter - cont Methimazole  GERD - cont Protonix  Shingles - cont Acyclovir for prevention     Discharge Exam: Vitals:   10/13/20 0751 10/13/20 1120  BP: 99/62 107/66  Pulse: 84 85  Resp: 18 18  Temp: 97.8 F (36.6 C) 97.6 F (36.4 C)  SpO2: 100% 99%   Vitals:   10/13/20 0427 10/13/20 0738 10/13/20 0751 10/13/20 1120  BP: (!) 110/58  99/62 107/66  Pulse: 92  84 85  Resp: '15  18 18  ' Temp: 98.5 F (36.9 C)  97.8 F (36.6 C) 97.6 F (36.4 C)  TempSrc: Oral     SpO2: 100% 100% 100% 99%  Weight:      Height:        General: Pt is alert, awake, not in acute distress Cardiovascular: RRR, S1/S2 +, no rubs, no gallops Respiratory: CTA bilaterally, no wheezing, no rhonchi Abdominal: Soft, NT, ND, bowel sounds + Extremities: no edema, no cyanosis   Discharge Instructions  Discharge Instructions  Diet - low sodium heart healthy   Complete by: As directed    Increase activity slowly   Complete by: As directed      Allergies as of 10/13/2020   No Known Allergies     Medication List    TAKE these medications   acyclovir 400 MG tablet Commonly known as: ZOVIRAX TAKE 1 TABLET BY MOUTH ONCE DAILY TO PREVENT SHINGLES What changed: See  the new instructions.   albuterol 108 (90 Base) MCG/ACT inhaler Commonly known as: VENTOLIN HFA Inhale 2 puffs into the lungs every 6 (six) hours as needed for wheezing or shortness of breath.   amoxicillin 500 MG capsule Commonly known as: AMOXIL Take 1 capsule (500 mg total) by mouth every 8 (eight) hours.   aspirin 81 MG EC tablet Take 1 tablet (81 mg total) by mouth daily.   ergocalciferol 1.25 MG (50000 UT) capsule Commonly known as: VITAMIN D2 Take 1 capsule (50,000 Units total) by mouth once a week.   feeding supplement Liqd Take 237 mLs by mouth 2 (two) times daily between meals.   Fluticasone-Salmeterol 500-50 MCG/DOSE Aepb Commonly known as: Advair Diskus Inhale 1 puff into the lungs 2 (two) times daily.   furosemide 40 MG tablet Commonly known as: Lasix Take 1 tablet (40 mg total) by mouth daily.   guaiFENesin-codeine 100-10 MG/5ML syrup TAKE 5 ML BY MOUTH  THREE TIMES DAILY AS NEEDED FOR COUGH. MAY CAUSE DROWSINESS. DO NOT DRIVE WITH THIS MEDICATION   Ipratropium-Albuterol 20-100 MCG/ACT Aers respimat Commonly known as: COMBIVENT Inhale into the lungs.   ipratropium-albuterol 0.5-2.5 (3) MG/3ML Soln Commonly known as: DUONEB Take 3 mLs by nebulization every 4 (four) hours as needed.   megestrol 625 MG/5ML suspension Commonly known as: MEGACE ES Take 5 mLs (625 mg total) by mouth daily.   methimazole 10 MG tablet Commonly known as: TAPAZOLE Take 2 tablets by mouth once daily   metoprolol succinate 25 MG 24 hr tablet Commonly known as: TOPROL-XL TAKE ONE AND ONE-HALF TABLETS BY MOUTH ONCE DAILY   mirtazapine 15 MG tablet Commonly known as: Remeron Take 1 tablet (15 mg total) by mouth at bedtime.   montelukast 10 MG tablet Commonly known as: Singulair Take 1 tablet (10 mg total) by mouth at bedtime.   pantoprazole 40 MG tablet Commonly known as: PROTONIX Take 1 tablet by mouth once daily   polyethylene glycol 17 g packet Commonly known as:  MIRALAX / GLYCOLAX Take 17 g by mouth daily as needed for mild constipation.   potassium chloride SA 20 MEQ tablet Commonly known as: KLOR-CON Take 1 tablet (20 mEq total) by mouth 2 (two) times daily.   spironolactone 25 MG tablet Commonly known as: ALDACTONE Take 1 tablet (25 mg total) by mouth daily.       No Known Allergies    CT Angio Chest PE W and/or Wo Contrast  Result Date: 10/10/2020 CLINICAL DATA:  Respiratory distress. EXAM: CT ANGIOGRAPHY CHEST WITH CONTRAST TECHNIQUE: Multidetector CT imaging of the chest was performed using the standard protocol during bolus administration of intravenous contrast. Multiplanar CT image reconstructions and MIPs were obtained to evaluate the vascular anatomy. CONTRAST:  85m OMNIPAQUE IOHEXOL 350 MG/ML SOLN COMPARISON:  05/24/2020 FINDINGS: Cardiovascular: Cardiomegaly, stable. Coronary artery and aortic atherosclerosis. No filling defects in the pulmonary arteries to suggest pulmonary emboli. Mediastinum/Nodes: No mediastinal, hilar, or axillary adenopathy. Thyroid markedly enlarged, stable since prior study. Lungs/Pleura: Dense consolidation in the left lower lobe concerning for pneumonia. No confluent  opacity on the right. No effusions. Upper Abdomen: Imaging into the upper abdomen demonstrates no acute findings. Review of the MIP images confirms the above findings. IMPRESSION: Dense consolidation throughout the entire left lower lobe most compatible with pneumonia. No evidence of pulmonary embolus. Cardiomegaly, coronary artery disease. Aortic Atherosclerosis (ICD10-I70.0). Electronically Signed   By: Rolm Baptise M.D.   On: 10/10/2020 17:01   NM THYROID MULT UPTAKE W/IMAGING  Result Date: 10/07/2020 CLINICAL DATA:  Graves disease, cold sensitivity, swelling of RIGHT neck for 6 months, weight gain, near syncopal episodes EXAM: THYROID SCAN AND UPTAKE - 4 AND 24 HOURS TECHNIQUE: Following oral administration of I-123 capsule, anterior planar  imaging was acquired at 24 hours. Thyroid uptake was calculated with a thyroid probe at 4-6 hours and 24 hours. RADIOPHARMACEUTICALS:  289.354 uCi I-123 sodium iodide p.o. COMPARISON:  None FINDINGS: Homogeneous tracer distribution throughout both thyroid lobes. No definite focal areas of increased or decreased tracer uptake. 4 hour I-123 uptake = 67.7% (normal 5-20%) 24 hour I-123 uptake = 68.8% (normal 10-30%) IMPRESSION: Significantly elevated 4 hour and 24 hour radio iodine uptakes consistent with hyperthyroidism. In combination with normal thyroid scan, findings are consistent with Graves disease. Electronically Signed   By: Lavonia Dana M.D.   On: 10/07/2020 12:37   CT ABDOMEN PELVIS W CONTRAST  Result Date: 10/10/2020 CLINICAL DATA:  Abdominal distention EXAM: CT ABDOMEN AND PELVIS WITH CONTRAST TECHNIQUE: Multidetector CT imaging of the abdomen and pelvis was performed using the standard protocol following bolus administration of intravenous contrast. CONTRAST:  80m OMNIPAQUE IOHEXOL 350 MG/ML SOLN COMPARISON:  04/05/2017 FINDINGS: Lower chest: Consolidation.  Seen in the visualized left lower lobe Hepatobiliary: No focal hepatic abnormality. Gallbladder unremarkable. Pancreas: No focal abnormality or ductal dilatation. Spleen: No focal abnormality.  Normal size. Adrenals/Urinary Tract: No adrenal abnormality. No focal renal abnormality. No stones or hydronephrosis. Urinary bladder is unremarkable. Stomach/Bowel: Normal appendix. Stomach, large and small bowel grossly unremarkable. Vascular/Lymphatic: Heavily calcified aorta and iliac vessels. No evidence of aneurysm or adenopathy. Reproductive: Low-density centrally in the uterus could reflect endometrial thickening or central fibroid. Scattered calcifications in the uterus, likely calcified fibroids. No adnexal mass. Other: No free fluid or free air. Musculoskeletal: No acute bony abnormality. IMPRESSION: Heavily calcified aorta and iliac vessels. No  acute findings in the abdomen or pelvis. Consolidation in the left lower lobe concerning for pneumonia. Central low-density in the uterus could reflect central fibroid or endometrial thickening. This could be further characterized with nonemergent pelvic ultrasound. Electronically Signed   By: KRolm BaptiseM.D.   On: 10/10/2020 17:05   DG Chest Portable 1 View  Result Date: 10/10/2020 CLINICAL DATA:  Respiratory distress, hypoxia EXAM: PORTABLE CHEST 1 VIEW COMPARISON:  05/24/2020 chest radiograph. FINDINGS: Stable cardiomediastinal silhouette with top-normal heart size. No pneumothorax. No pleural effusion. Lungs appear clear, with no acute consolidative airspace disease and no pulmonary edema. IMPRESSION: No active disease. Electronically Signed   By: JIlona SorrelM.D.   On: 10/10/2020 15:45     The results of significant diagnostics from this hospitalization (including imaging, microbiology, ancillary and laboratory) are listed below for reference.     Microbiology: Recent Results (from the past 240 hour(s))  Resp Panel by RT-PCR (Flu A&B, Covid) Nasopharyngeal Swab     Status: None   Collection Time: 10/10/20  2:06 PM   Specimen: Nasopharyngeal Swab; Nasopharyngeal(NP) swabs in vial transport medium  Result Value Ref Range Status   SARS Coronavirus 2 by RT PCR NEGATIVE  NEGATIVE Final    Comment: (NOTE) SARS-CoV-2 target nucleic acids are NOT DETECTED.  The SARS-CoV-2 RNA is generally detectable in upper respiratory specimens during the acute phase of infection. The lowest concentration of SARS-CoV-2 viral copies this assay can detect is 138 copies/mL. A negative result does not preclude SARS-Cov-2 infection and should not be used as the sole basis for treatment or other patient management decisions. A negative result may occur with  improper specimen collection/handling, submission of specimen other than nasopharyngeal swab, presence of viral mutation(s) within the areas targeted by  this assay, and inadequate number of viral copies(<138 copies/mL). A negative result must be combined with clinical observations, patient history, and epidemiological information. The expected result is Negative.  Fact Sheet for Patients:  EntrepreneurPulse.com.au  Fact Sheet for Healthcare Providers:  IncredibleEmployment.be  This test is no t yet approved or cleared by the [place] FDA and  has been authorized for detection and/or diagnosis of SARS-CoV-2 by FDA under an Emergency Use Authorization (EUA). This EUA will remain  in effect (meaning this test can be used) for the duration of the COVID-19 declaration under Section 564(b)(1) of the Act, 21 U.S.C.section 360bbb-3(b)(1), unless the authorization is terminated  or revoked sooner.       Influenza A by PCR NEGATIVE NEGATIVE Final   Influenza B by PCR NEGATIVE NEGATIVE Final    Comment: (NOTE) The Xpert Xpress SARS-CoV-2/FLU/RSV plus assay is intended as an aid in the diagnosis of influenza from Nasopharyngeal swab specimens and should not be used as a sole basis for treatment. Nasal washings and aspirates are unacceptable for Xpert Xpress SARS-CoV-2/FLU/RSV testing.  Fact Sheet for Patients: EntrepreneurPulse.com.au  Fact Sheet for Healthcare Providers: IncredibleEmployment.be  This test is not yet approved or cleared by the [place] FDA and has been authorized for detection and/or diagnosis of SARS-CoV-2 by FDA under an Emergency Use Authorization (EUA). This EUA will remain in effect (meaning this test can be used) for the duration of the COVID-19 declaration under Section 564(b)(1) of the Act, 21 U.S.C. section 360bbb-3(b)(1), unless the authorization is terminated or revoked.  Performed at [facility] [place], [address], [place], [place] [address]   Blood Culture (routine x 2)     Status: Abnormal   Collection Time:  10/10/20  2:06 PM   Specimen: BLOOD  Result Value Ref Range Status   Specimen Description   Final    BLOOD LEFT ANTECUBITAL Performed at [facility], [address] [place], [place] [address]    Special Requests   Final    BOTTLES DRAWN AEROBIC AND ANAEROBIC Blood Culture results may not be optimal due to an excessive volume of blood received in culture bottles Performed at [facility], [address] [place], [place] [address]    Culture  Setup Time   Final    GRAM POSITIVE COCCI IN BOTH AEROBIC AND ANAEROBIC BOTTLES CRITICAL RESULT CALLED TO, READ BACK BY AND VERIFIED WITH: JASON ROBBINS AT [place] 10/11/20 MF. Performed at [facility], [place] [address], [place], [place] [address]    Culture STREPTOCOCCUS PNEUMONIAE (A)  Final   Report Status 10/13/2020 FINAL  Final   Organism ID, Bacteria STREPTOCOCCUS PNEUMONIAE  Final      Susceptibility   Streptococcus pneumoniae - MIC*    ERYTHROMYCIN 2 RESISTANT Resistant     LEVOFLOXACIN 1 SENSITIVE Sensitive     VANCOMYCIN 0.5 SENSITIVE Sensitive     PENO - penicillin <=0.06  PENICILLIN (non-meningitis) <=0.06 SENSITIVE Sensitive     PENICILLIN (oral) <=0.06 SENSITIVE Sensitive     CEFTRIAXONE (non-meningitis) <=0.12 SENSITIVE Sensitive     * STREPTOCOCCUS PNEUMONIAE  Blood Culture (routine x 2)     Status: Abnormal   Collection Time: 10/10/20  2:06 PM   Specimen: BLOOD  Result Value Ref Range Status   Specimen Description   Final    BLOOD RIGHT ANTECUBITAL Performed at [facility], [address] [place], [place] [address]    Special Requests   Final    BOTTLES DRAWN AEROBIC AND ANAEROBIC Blood Culture results may not be optimal due to an excessive volume of blood received in culture bottles Performed at [facility], [address] [place], [place] [address]    Culture  Setup Time   Final    GRAM POSITIVE COCCI IN BOTH AEROBIC AND ANAEROBIC BOTTLES CRITICAL VALUE NOTED.   VALUE IS CONSISTENT WITH PREVIOUSLY REPORTED AND CALLED VALUE. Performed at [facility], [address] [place], [place] [address]    Culture (A)  Final    STREPTOCOCCUS PNEUMONIAE SUSCEPTIBILITIES PERFORMED ON PREVIOUS CULTURE WITHIN THE LAST 5 DAYS. Performed at [facility], [place] [address], [place], [place] [address]    Report Status 10/13/2020 FINAL  Final  Blood Culture ID Panel (Reflexed)     Status: Abnormal   Collection Time: 10/10/20  2:06 PM  Result Value Ref Range Status   Enterococcus faecalis NOT DETECTED NOT DETECTED Final   Enterococcus Faecium NOT DETECTED NOT DETECTED Final   Listeria monocytogenes NOT DETECTED NOT DETECTED Final   Staphylococcus species NOT DETECTED NOT DETECTED Final   Staphylococcus aureus (BCID) NOT DETECTED NOT DETECTED Final   Staphylococcus epidermidis NOT DETECTED NOT DETECTED Final   Staphylococcus lugdunensis NOT DETECTED NOT DETECTED Final   Streptococcus species DETECTED (A) NOT DETECTED Final    Comment: CRITICAL RESULT CALLED TO, READ BACK BY AND VERIFIED WITH: JASSON ROBBINS AT 0329 10/11/20 MF    Streptococcus agalactiae NOT DETECTED NOT DETECTED Final   Streptococcus pneumoniae DETECTED (A) NOT DETECTED Final    Comment: CRITICAL RESULT CALLED TO, READ BACK BY AND VERIFIED WITH: JASON ROBBINS AT 0329 10/11/20 MF.    Streptococcus pyogenes NOT DETECTED NOT DETECTED Final   A.calcoaceticus-baumannii NOT DETECTED NOT DETECTED Final   Bacteroides fragilis NOT DETECTED NOT DETECTED Final   Enterobacterales NOT DETECTED NOT DETECTED Final   Enterobacter cloacae complex NOT DETECTED NOT DETECTED Final   Escherichia coli NOT DETECTED NOT DETECTED Final   Klebsiella aerogenes NOT DETECTED NOT DETECTED Final   Klebsiella oxytoca NOT DETECTED NOT DETECTED Final   Klebsiella pneumoniae NOT DETECTED NOT DETECTED Final   Proteus species NOT DETECTED NOT DETECTED Final   Salmonella species NOT DETECTED NOT DETECTED Final    Serratia marcescens NOT DETECTED NOT DETECTED Final   Haemophilus influenzae NOT DETECTED NOT DETECTED Final   Neisseria meningitidis NOT DETECTED NOT DETECTED Final   Pseudomonas aeruginosa NOT DETECTED NOT DETECTED Final   Stenotrophomonas maltophilia NOT DETECTED NOT DETECTED Final   Candida albicans NOT DETECTED NOT DETECTED Final   Candida auris NOT DETECTED NOT DETECTED Final   Candida glabrata NOT DETECTED NOT DETECTED Final   Candida krusei NOT DETECTED NOT DETECTED Final   Candida parapsilosis NOT DETECTED NOT DETECTED Final   Candida tropicalis NOT DETECTED NOT DETECTED Final   Cryptococcus neoformans/gattii NOT DETECTED NOT DETECTED Final    Comment: Performed at [facility], [place]  [address] [place], [place] [address]  Urine culture     Status: Abnormal   Collection Time: 10/10/20  6:13 PM   Specimen: Urine, Random  Result Value Ref Range Status   Specimen Description   Final    URINE, RANDOM Performed at [facility], [address], [place], [place] [address]    Special Requests   Final    NONE Performed at [facility], [address] [place], [place] [address]    Culture (A)  Final    <10,000 COLONIES/mL INSIGNIFICANT GROWTH Performed at [place] [address], [place], [place] [address]    Report Status 10/12/2020 FINAL  Final     Labs: BNP (last 3 results) Recent Labs    05/24/20 0040 05/28/20 1026 10/10/20 1354  BNP 574.9* 877.0* 802.2*   Basic Metabolic Panel: Recent Labs  Lab 10/10/20 1354 10/11/20 0509 10/11/20 1340 10/12/20 1402  NA 131* 139 135 138  K 3.3* 4.0 3.4* 4.0  CL 102 113* 109 116*  CO2 17* 18* 16* 17*  GLUCOSE 106* 96 188* 156*  BUN '22 20 23 ' 27*  CREATININE 0.73 0.52 0.57 0.66  CALCIUM 8.5* 8.6* 8.7* 8.4*   Liver Function Tests: Recent Labs  Lab 10/10/20 1354 10/11/20 1340 10/12/20 1402  AST 110* 66* 35  ALT 54* 50* 35  ALKPHOS 221* 137* 124  BILITOT 1.5* 0.7 0.5  PROT 6.8  6.1* 6.0*  ALBUMIN 3.4* 2.9* 2.8*   No results for input(s): LIPASE, AMYLASE in the last 168 hours. No results for input(s): AMMONIA in the last 168 hours. CBC: Recent Labs  Lab 10/10/20 1354 10/11/20 0509 10/12/20 0501 10/13/20 0409  WBC 7.2 11.7* 13.3* 12.4*  NEUTROABS 4.3  --   --   --   HGB 11.1* 9.8* 9.1* 9.4*  HCT 35.0* 30.6* 29.4* 29.9*  MCV 82.0 82.7 83.1 82.6  PLT 128* 114* 129* 146*   Cardiac Enzymes: No results for input(s): CKTOTAL, CKMB, CKMBINDEX, TROPONINI in the last 168 hours. BNP: Invalid input(s): POCBNP CBG: No results for input(s): GLUCAP in the last 168 hours. D-Dimer No results for input(s): DDIMER in the last 72 hours. Hgb A1c No results for input(s): HGBA1C in the last 72 hours. Lipid Profile No results for input(s): CHOL, HDL, LDLCALC, TRIG, CHOLHDL, LDLDIRECT in the last 72 hours. Thyroid function studies No results for input(s): TSH, T4TOTAL, T3FREE, THYROIDAB in the last 72 hours.  Invalid input(s): FREET3 Anemia work up No results for input(s): VITAMINB12, FOLATE, FERRITIN, TIBC, IRON, RETICCTPCT in the last 72 hours. Urinalysis    Component Value Date/Time   COLORURINE YELLOW (A) 10/10/2020 1813   APPEARANCEUR CLEAR (A) 10/10/2020 1813   LABSPEC 1.016 10/10/2020 1813   PHURINE 5.0 10/10/2020 1813   GLUCOSEU NEGATIVE 10/10/2020 1813   HGBUR MODERATE (A) 10/10/2020 1813   BILIRUBINUR NEGATIVE 10/10/2020 1813   KETONESUR NEGATIVE 10/10/2020 1813   PROTEINUR NEGATIVE 10/10/2020 1813   NITRITE NEGATIVE 10/10/2020 1813   LEUKOCYTESUR NEGATIVE 10/10/2020 1813   Sepsis Labs Invalid input(s): PROCALCITONIN,  WBC,  LACTICIDVEN Microbiology Recent Results (from the past 240 hour(s))  Resp Panel by RT-PCR (Flu A&B, Covid) Nasopharyngeal Swab     Status: None   Collection Time: 10/10/20  2:06 PM   Specimen: Nasopharyngeal Swab; Nasopharyngeal(NP) swabs in vial transport medium  Result Value Ref Range Status   SARS Coronavirus 2 by RT PCR  NEGATIVE NEGATIVE Final    Comment: (NOTE) SARS-CoV-2 target nucleic acids are NOT  DETECTED.  The SARS-CoV-2 RNA is generally detectable in upper respiratory specimens during the acute phase of infection. The lowest concentration of SARS-CoV-2 viral copies this assay can detect is 138 copies/mL. A negative result does not preclude SARS-Cov-2 infection and should not be used as the sole basis for treatment or other patient management decisions. A negative result may occur with  improper specimen collection/handling, submission of specimen other than nasopharyngeal swab, presence of viral mutation(s) within the areas targeted by this assay, and inadequate number of viral copies(<138 copies/mL). A negative result must be combined with clinical observations, patient history, and epidemiological information. The expected result is Negative.  Fact Sheet for Patients:  EntrepreneurPulse.com.au  Fact Sheet for Healthcare Providers:  IncredibleEmployment.be  This test is no t yet approved or cleared by the [place] FDA and  has been authorized for detection and/or diagnosis of SARS-CoV-2 by FDA under an Emergency Use Authorization (EUA). This EUA will remain  in effect (meaning this test can be used) for the duration of the COVID-19 declaration under Section 564(b)(1) of the Act, 21 U.S.C.section 360bbb-3(b)(1), unless the authorization is terminated  or revoked sooner.       Influenza A by PCR NEGATIVE NEGATIVE Final   Influenza B by PCR NEGATIVE NEGATIVE Final    Comment: (NOTE) The Xpert Xpress SARS-CoV-2/FLU/RSV plus assay is intended as an aid in the diagnosis of influenza from Nasopharyngeal swab specimens and should not be used as a sole basis for treatment. Nasal washings and aspirates are unacceptable for Xpert Xpress SARS-CoV-2/FLU/RSV testing.  Fact Sheet for Patients: EntrepreneurPulse.com.au  Fact Sheet for  Healthcare Providers: IncredibleEmployment.be  This test is not yet approved or cleared by the [place] FDA and has been authorized for detection and/or diagnosis of SARS-CoV-2 by FDA under an Emergency Use Authorization (EUA). This EUA will remain in effect (meaning this test can be used) for the duration of the COVID-19 declaration under Section 564(b)(1) of the Act, 21 U.S.C. section 360bbb-3(b)(1), unless the authorization is terminated or revoked.  Performed at [facility], [address] [place], [place] [address]   Blood Culture (routine x 2)     Status: Abnormal   Collection Time: 10/10/20  2:06 PM   Specimen: BLOOD  Result Value Ref Range Status   Specimen Description   Final    BLOOD LEFT ANTECUBITAL Performed at [facility], [address] [place], [place] [address]    Special Requests   Final    BOTTLES DRAWN AEROBIC AND ANAEROBIC Blood Culture results may not be optimal due to an excessive volume of blood received in culture bottles Performed at [facility], [address], [place], [place] [address]    Culture  Setup Time   Final    GRAM POSITIVE COCCI IN BOTH AEROBIC AND ANAEROBIC BOTTLES CRITICAL RESULT CALLED TO, READ BACK BY AND VERIFIED WITH: JASON ROBBINS AT [place] 10/11/20 MF. Performed at [facility], [place] [address], [place], [place] [address]    Culture STREPTOCOCCUS PNEUMONIAE (A)  Final   Report Status 10/13/2020 FINAL  Final   Organism ID, Bacteria STREPTOCOCCUS PNEUMONIAE  Final      Susceptibility   Streptococcus pneumoniae - MIC*    ERYTHROMYCIN 2 RESISTANT Resistant     LEVOFLOXACIN 1 SENSITIVE Sensitive     VANCOMYCIN 0.5 SENSITIVE Sensitive     PENO - penicillin <=0.06      PENICILLIN (non-meningitis) <=0.06 SENSITIVE Sensitive     PENICILLIN (oral) <=0.06  SENSITIVE Sensitive     CEFTRIAXONE (non-meningitis) <=0.12 SENSITIVE Sensitive     * STREPTOCOCCUS PNEUMONIAE  Blood  Culture (routine x 2)     Status: Abnormal   Collection Time: 10/10/20  2:06 PM   Specimen: BLOOD  Result Value Ref Range Status   Specimen Description   Final    BLOOD RIGHT ANTECUBITAL Performed at [facility], [address] [place], [place] [address]    Special Requests   Final    BOTTLES DRAWN AEROBIC AND ANAEROBIC Blood Culture results may not be optimal due to an excessive volume of blood received in culture bottles Performed at [facility], [address], [place], [place] [address]    Culture  Setup Time   Final    GRAM POSITIVE COCCI IN BOTH AEROBIC AND ANAEROBIC BOTTLES CRITICAL VALUE NOTED.  VALUE IS CONSISTENT WITH PREVIOUSLY REPORTED AND CALLED VALUE. Performed at [facility], [address] [place], [place] [address]    Culture (A)  Final    STREPTOCOCCUS PNEUMONIAE SUSCEPTIBILITIES PERFORMED ON PREVIOUS CULTURE WITHIN THE LAST 5 DAYS. Performed at [facility], [place] [address], [place], [place] [address]    Report Status 10/13/2020 FINAL  Final  Blood Culture ID Panel (Reflexed)     Status: Abnormal   Collection Time: 10/10/20  2:06 PM  Result Value Ref Range Status   Enterococcus faecalis NOT DETECTED NOT DETECTED Final   Enterococcus Faecium NOT DETECTED NOT DETECTED Final   Listeria monocytogenes NOT DETECTED NOT DETECTED Final   Staphylococcus species NOT DETECTED NOT DETECTED Final   Staphylococcus aureus (BCID) NOT DETECTED NOT DETECTED Final   Staphylococcus epidermidis NOT DETECTED NOT DETECTED Final   Staphylococcus lugdunensis NOT DETECTED NOT DETECTED Final   Streptococcus species DETECTED (A) NOT DETECTED Final    Comment: CRITICAL RESULT CALLED TO, READ BACK BY AND VERIFIED WITH: JASSON ROBBINS AT 0329 10/11/20 MF    Streptococcus agalactiae NOT DETECTED NOT DETECTED Final   Streptococcus pneumoniae DETECTED (A) NOT DETECTED Final    Comment: CRITICAL RESULT CALLED TO, READ BACK BY AND VERIFIED WITH: JASON  ROBBINS AT 0329 10/11/20 MF.    Streptococcus pyogenes NOT DETECTED NOT DETECTED Final   A.calcoaceticus-baumannii NOT DETECTED NOT DETECTED Final   Bacteroides fragilis NOT DETECTED NOT DETECTED Final   Enterobacterales NOT DETECTED NOT DETECTED Final   Enterobacter cloacae complex NOT DETECTED NOT DETECTED Final   Escherichia coli NOT DETECTED NOT DETECTED Final   Klebsiella aerogenes NOT DETECTED NOT DETECTED Final   Klebsiella oxytoca NOT DETECTED NOT DETECTED Final   Klebsiella pneumoniae NOT DETECTED NOT DETECTED Final   Proteus species NOT DETECTED NOT DETECTED Final   Salmonella species NOT DETECTED NOT DETECTED Final   Serratia marcescens NOT DETECTED NOT DETECTED Final   Haemophilus influenzae NOT DETECTED NOT DETECTED Final   Neisseria meningitidis NOT DETECTED NOT DETECTED Final   Pseudomonas aeruginosa NOT DETECTED NOT DETECTED Final   Stenotrophomonas maltophilia NOT DETECTED NOT DETECTED Final   Candida albicans NOT DETECTED NOT DETECTED Final   Candida auris NOT DETECTED NOT DETECTED Final   Candida glabrata NOT DETECTED NOT DETECTED Final   Candida krusei NOT DETECTED NOT DETECTED Final   Candida parapsilosis NOT DETECTED NOT DETECTED Final   Candida tropicalis NOT DETECTED NOT DETECTED Final   Cryptococcus neoformans/gattii NOT DETECTED NOT DETECTED Final    Comment: Performed at [facility], [address], [place], [place] [address]  Urine culture  Status: Abnormal   Collection Time: 10/10/20  6:13 PM   Specimen: Urine, Random  Result Value Ref Range Status   Specimen Description   Final    URINE, RANDOM Performed at [facility], [address] [place], [place] [address]    Special Requests   Final    NONE Performed at [facility], [address] [place], [place] [address]    Culture (A)  Final    <10,000 COLONIES/mL INSIGNIFICANT GROWTH Performed at [place] [address], [place], [place] [address]     Report Status 10/12/2020 FINAL  Final     Time coordinating discharge in minutes: 65  SIGNED:   Debbe Odea, MD  [facility] 10/14/2020, 5:08 PM

## 2020-10-14 LAB — LEGIONELLA PNEUMOPHILA SEROGP 1 UR AG: L. pneumophila Serogp 1 Ur Ag: NEGATIVE

## 2020-10-24 ENCOUNTER — Other Ambulatory Visit: Payer: Self-pay

## 2020-10-24 ENCOUNTER — Inpatient Hospital Stay: Payer: Medicare Other | Attending: Internal Medicine

## 2020-10-24 ENCOUNTER — Inpatient Hospital Stay (HOSPITAL_BASED_OUTPATIENT_CLINIC_OR_DEPARTMENT_OTHER): Payer: Medicare Other | Admitting: Internal Medicine

## 2020-10-24 DIAGNOSIS — C9000 Multiple myeloma not having achieved remission: Secondary | ICD-10-CM

## 2020-10-24 DIAGNOSIS — Z9221 Personal history of antineoplastic chemotherapy: Secondary | ICD-10-CM | POA: Diagnosis not present

## 2020-10-24 DIAGNOSIS — Z298 Encounter for other specified prophylactic measures: Secondary | ICD-10-CM | POA: Diagnosis present

## 2020-10-24 DIAGNOSIS — Z9225 Personal history of immunosupression therapy: Secondary | ICD-10-CM | POA: Insufficient documentation

## 2020-10-24 DIAGNOSIS — C9002 Multiple myeloma in relapse: Secondary | ICD-10-CM | POA: Diagnosis not present

## 2020-10-24 DIAGNOSIS — F17210 Nicotine dependence, cigarettes, uncomplicated: Secondary | ICD-10-CM | POA: Insufficient documentation

## 2020-10-24 LAB — COMPREHENSIVE METABOLIC PANEL
ALT: 11 U/L (ref 0–44)
AST: 18 U/L (ref 15–41)
Albumin: 3.5 g/dL (ref 3.5–5.0)
Alkaline Phosphatase: 183 U/L — ABNORMAL HIGH (ref 38–126)
Anion gap: 8 (ref 5–15)
BUN: 9 mg/dL (ref 8–23)
CO2: 23 mmol/L (ref 22–32)
Calcium: 8.5 mg/dL — ABNORMAL LOW (ref 8.9–10.3)
Chloride: 107 mmol/L (ref 98–111)
Creatinine, Ser: 0.5 mg/dL (ref 0.44–1.00)
GFR, Estimated: >60 mL/min (ref >60)
Glucose, Bld: 100 mg/dL — ABNORMAL HIGH (ref 70–99)
Potassium: 4 mmol/L (ref 3.5–5.1)
Sodium: 138 mmol/L (ref 135–145)
Total Bilirubin: 0.5 mg/dL (ref 0.3–1.2)
Total Protein: 7.2 g/dL (ref 6.5–8.1)

## 2020-10-24 LAB — CBC WITH DIFFERENTIAL/PLATELET
Abs Immature Granulocytes: 0 10*3/uL (ref 0.00–0.07)
Basophils Absolute: 0 10*3/uL (ref 0.0–0.1)
Basophils Relative: 1 %
Eosinophils Absolute: 0.1 10*3/uL (ref 0.0–0.5)
Eosinophils Relative: 3 %
HCT: 32.8 % — ABNORMAL LOW (ref 36.0–46.0)
Hemoglobin: 10.3 g/dL — ABNORMAL LOW (ref 12.0–15.0)
Immature Granulocytes: 0 %
Lymphocytes Relative: 51 %
Lymphs Abs: 1.9 10*3/uL (ref 0.7–4.0)
MCH: 26.5 pg (ref 26.0–34.0)
MCHC: 31.4 g/dL (ref 30.0–36.0)
MCV: 84.3 fL (ref 80.0–100.0)
Monocytes Absolute: 0.5 10*3/uL (ref 0.1–1.0)
Monocytes Relative: 13 %
Neutro Abs: 1.2 10*3/uL — ABNORMAL LOW (ref 1.7–7.7)
Neutrophils Relative %: 32 %
Platelets: 287 10*3/uL (ref 150–400)
RBC: 3.89 MIL/uL (ref 3.87–5.11)
RDW: 14.5 % (ref 11.5–15.5)
Smear Review: NORMAL
WBC: 3.8 10*3/uL — ABNORMAL LOW (ref 4.0–10.5)
nRBC: 0 % (ref 0.0–0.2)

## 2020-10-24 NOTE — Progress Notes (Signed)
[place] OFFICE PROGRESS NOTE  Patient Care Team: [facility], [place] as PCP - General (General Practice) End, Harrell Gave, MD as PCP - Cardiology (Cardiology) [name], [name] [name], MD as Consulting Physician (Hematology and Oncology) Ottie Glazier, MD as Consulting Physician (Pulmonary Disease) Gabriel Carina Betsey Holiday, MD as Consulting Physician (Endocrinology)  Cancer Staging No matching staging information was found for the patient.   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 No morphologic support for residual myeloma disease. Negative for minimal residual disease by MM-MRD flow cytometry; HOLD REVLIMID [leg swelling]; AUG 2021-PET scan negative for myeloma; continue to hold Revlimid  # MARCH 2021-diastolic congestive heart failure [Dr.End]  # BMBx- OCT 2021- BMBx- 5% plasmacytosis-however this appears to be more polyclonal rather than monoclonal-not explain patient worsening anemia.  #November 2021-hyperthyroidism/goiter-  [D.Bennett/Dr.Solum]-methimazole.  --------------------------  # 12/12- RIGHT JUGULAR DVT-x 325mn xarelto; finished April 2020; September 2020-EGD/dysphagia; Dr. AnVicente Males# Acute renal failure [Dr.Singh; Proteinuria 1.5gm/day ]; acyclovir/Asprin ------------------------------------------------------------------------------------------------------------   DIAGNOSIS: _0  MULTIPLE MYELOMA  STAGE: III/HIGH RISK ;GOALS: CONTROL  CURRENT/MOST RECENT THERAPY-maintenance Revlimid- on HOLD    [place] 11[place] -  Chemotherapy   The patient had dexamethasone (DECADRON) 4 MG tablet, 1 of 1 cycle, Start date: --, End date: -- palonosetron (ALOXI) injection 0.25 mg, 0.25 mg, Intravenous,  Once, 6 of 7 cycles Administration: 0.25 mg (11/21/2017), 0.25 mg (11/29/2017), 0.25 mg (12/05/2017), 0.25 mg (12/21/2017), 0.25 mg (12/28/2017), 0.25 mg (01/04/2018), 0.25 mg (01/18/2018), 0.25 mg (01/25/2018), 0.25 mg (02/01/2018), 0.25 mg (02/15/2018), 0.25 mg (02/22/2018), 0.25 mg (03/01/2018), 0.25 mg (03/15/2018), 0.25 mg (03/22/2018), 0.25 mg (03/30/2018), 0.25 mg (04/13/2018), 0.25 mg (04/20/2018), 0.25 mg (04/27/2018) cyclophosphamide (CYTOXAN) 500 mg in sodium chloride 0.9 % 250 mL chemo infusion, 540 mg, Intravenous,  Once, 6 of 7 cycles Administration: 500 mg (11/21/2017), 500 mg (11/29/2017), 500 mg (12/05/2017), 500 mg (12/28/2017), 500 mg (01/04/2018), 500 mg (01/18/2018), 500 mg (01/25/2018), 500 mg (02/01/2018), 500 mg (02/15/2018), 500 mg (02/22/2018), 500 mg (03/01/2018), 500 mg (03/15/2018), 500 mg (03/30/2018), 500 mg (04/13/2018), 500 mg (04/20/2018), 500 mg (04/27/2018) carfilzomib (KYPROLIS) 36 mg in dextrose 5 % 50 mL chemo infusion, 20 mg/m2 = 36 mg, Intravenous, Once, 6 of 7 cycles Administration: 36 mg (11/21/2017), 36 mg (11/22/2017), 60 mg (11/29/2017), 60 mg (11/30/2017), 60 mg (12/05/2017), 60 mg (12/06/2017), 60 mg (12/21/2017), 60 mg (12/22/2017), 60 mg (12/28/2017), 60 mg (12/29/2017), 60 mg (01/04/2018), 60 mg (01/05/2018), 60 mg  (01/18/2018), 60 mg (01/19/2018), 60 mg (01/25/2018), 60 mg (01/27/2018), 60 mg (02/01/2018), 60 mg (02/02/2018), 60 mg (02/15/2018), 60 mg (02/16/2018), 60 mg (02/22/2018), 60 mg (03/01/2018), 60 mg (03/02/2018), 60 mg (03/15/2018), 60 mg (03/16/2018), 60 mg (03/23/2018), 60 mg (03/30/2018), 60  mg (03/31/2018), 60 mg (04/13/2018), 60 mg (04/14/2018), 60 mg (04/20/2018), 60 mg (04/21/2018), 60 mg (04/27/2018)  for chemotherapy treatment.     INTERVAL HISTORY:  Brandi [name] 70 y.o.  female pleasant patient above history of relapsed multiple myeloma--most recently not on maintenance Revlimid [on since Feb 2021] is here for follow-up.  Patient was recently admitted to hospital for acute respiratory failure/sepsis-streptococcal pneumonia.  Patient was treated with ceftriaxone.  Given the recent admission patient's radioactive iodine for her hyperthyroidism/goiter is on hold.  Continues to been methimazole.  Otherwise her breathing is improved.  No new shortness of breath or cough   Review of Systems  Constitutional: Positive for malaise/fatigue. Negative for chills, diaphoresis and fever.  HENT: Negative for nosebleeds and sore throat.   Eyes: Negative for double vision.  Respiratory: Positive for shortness of breath. Negative for hemoptysis.   Cardiovascular: Negative for chest pain, palpitations and orthopnea.  Gastrointestinal: Negative for abdominal pain, blood in stool, constipation, heartburn and melena.  Genitourinary: Negative for dysuria, frequency and urgency.  Musculoskeletal: Positive for back pain and myalgias.  Skin: Negative.  Negative for itching and rash.  Neurological: Negative for dizziness, tingling, focal weakness, weakness and headaches.  Endo/Heme/Allergies: Does not bruise/bleed easily.  Psychiatric/Behavioral: Negative for depression. The patient has insomnia. The patient is not nervous/anxious.     PAST MEDICAL HISTORY :  Past Medical History:  Diagnosis Date  . Anxiety   . At risk  for cardiac dysfunction   . CHF (congestive heart failure) ([place])   . COPD (chronic obstructive pulmonary disease) ([place])   . Hypertension   . Hyperthyroidism   . Hypokalemia   . IgA myeloma ([place])   . Multiple myeloma ([place])   . Pneumonia   . Red blood cell antibody positive, compatible PRBC difficult to obtain   . S/P autologous bone marrow transplantation ([place])     PAST SURGICAL HISTORY :   Past Surgical History:  Procedure Laterality Date  . ESOPHAGOGASTRODUODENOSCOPY (EGD) WITH PROPOFOL N/A 03/30/2019   Procedure: ESOPHAGOGASTRODUODENOSCOPY (EGD) WITH PROPOFOL;  Surgeon: Jonathon Bellows, MD;  [facility] ENDOSCOPY;  Service: Gastroenterology;  Laterality: N/A;  . IR FLUORO GUIDE PORT INSERTION RIGHT  12/16/2017    FAMILY HISTORY :   Family History  Problem Relation Age of Onset  . Pneumonia Mother   . Seizures Father     SOCIAL HISTORY:   Social History   Tobacco Use  . Smoking status: Current Every Day Smoker    Packs/day: 0.50    Types: Cigarettes  . Smokeless tobacco: Never Used  . Tobacco comment: 2 cigarettes QOD  Vaping Use  . Vaping Use: Never used  Substance Use Topics  . Alcohol use: No  . Drug use: No    ALLERGIES:  has No Known Allergies.  MEDICATIONS:  Current Outpatient Medications  Medication Sig Dispense Refill  . acyclovir (ZOVIRAX) 400 MG tablet TAKE 1 TABLET BY MOUTH ONCE DAILY TO PREVENT SHINGLES (Patient taking differently: Take 400 mg by mouth daily. TAKE 1 TABLET BY MOUTH ONCE DAILY TO  PREVENT  SHINGLES) 30 tablet 0  . albuterol (VENTOLIN HFA) 108 (90 Base) MCG/ACT inhaler Inhale 2 puffs into the lungs every 6 (six) hours as needed for wheezing or shortness of breath. 8 g 3  . amoxicillin (AMOXIL) 500 MG capsule Take 1 capsule (500 mg total) by mouth every 8 (eight) hours. 17 capsule 0  . aspirin EC 81 MG EC tablet Take 1 tablet (81 mg total) by  mouth daily. 90 tablet 3  . ergocalciferol (VITAMIN D2) 1.25 MG (50000 UT) capsule Take 1 capsule  (50,000 Units total) by mouth once a week. 12 capsule 2  . feeding supplement, ENSURE ENLIVE, (ENSURE ENLIVE) LIQD Take 237 mLs by mouth 2 (two) times daily between meals. 60 Bottle 0  . Fluticasone-Salmeterol (ADVAIR DISKUS) 500-50 MCG/DOSE AEPB Inhale 1 puff into the lungs 2 (two) times daily. 1 each 3  . furosemide (LASIX) 40 MG tablet Take 1 tablet (40 mg total) by mouth daily. 90 tablet 3  . guaiFENesin-codeine 100-10 MG/5ML syrup TAKE 5 ML BY MOUTH  THREE TIMES DAILY AS NEEDED FOR COUGH. MAY CAUSE DROWSINESS. DO NOT DRIVE WITH THIS MEDICATION 120 mL 0  . Ipratropium-Albuterol (COMBIVENT) 20-100 MCG/ACT AERS respimat Inhale into the lungs.    [name] ipratropium-albuterol (DUONEB) 0.5-2.5 (3) MG/3ML SOLN Take 3 mLs by nebulization every 4 (four) hours as needed. 360 mL 3  . methimazole (TAPAZOLE) 10 MG tablet Take 2 tablets by mouth once daily (Patient taking differently: Take 20 mg by mouth daily.) 60 tablet 0  . mirtazapine (REMERON) 15 MG tablet Take 1 tablet (15 mg total) by mouth at bedtime. 30 tablet 4  . montelukast (SINGULAIR) 10 MG tablet Take 1 tablet (10 mg total) by mouth at bedtime. 30 tablet 3  . polyethylene glycol (MIRALAX / GLYCOLAX) packet Take 17 g by mouth daily as needed for mild constipation. 14 each 0  . potassium chloride SA (KLOR-CON) 20 MEQ tablet Take 1 tablet (20 mEq total) by mouth 2 (two) times daily. 60 tablet 6  . spironolactone (ALDACTONE) 25 MG tablet Take 1 tablet (25 mg total) by mouth daily. 90 tablet 1  . metoprolol succinate (TOPROL-XL) 25 MG 24 hr tablet TAKE 1 & 1/2 (ONE & ONE-HALF) TABLETS BY MOUTH ONCE DAILY 45 tablet 0  . pantoprazole (PROTONIX) 40 MG tablet Take 1 tablet (40 mg total) by mouth daily. 90 tablet 1   No current facility-administered medications for this visit.   Facility-Administered Medications Ordered in Other Visits  Medication Dose Route Frequency Provider Last Rate Last Admin  . 0.9 %  sodium chloride infusion   Intravenous Continuous  Monia Sabal, PA-C      . sodium chloride flush (NS) 0.9 % injection 10 mL  10 mL Intracatheter PRN [name], MD   10 mL at 02/23/18 1400    PHYSICAL EXAMINATION: ECOG PERFORMANCE STATUS: 1 - Symptomatic but completely ambulatory  BP 109/77   Pulse (!) 49   Temp 98.2 F (36.8 C) (Tympanic)   Resp 20   Ht _0  (1.575 m)   Wt 142 lb 8 oz (64.6 kg)   SpO2 100%   BMI 26.06 kg/m   Filed Weights   10/24/20 1346  Weight: 142 lb 8 oz (64.6 kg)    Physical Exam Constitutional:      Comments: patient in a wheelchair.  She is accompanied by family..  She is on 2 L nasal cannula.  HENT:     Head: Normocephalic and atraumatic.  Eyes:     Pupils: Pupils are equal, round, and reactive to light.  Neck:     Comments: Goiter noted. Cardiovascular:     Rate and Rhythm: Normal rate and regular rhythm.  Pulmonary:     Effort: Pulmonary effort is normal. No respiratory distress.     Breath sounds: Normal breath sounds. No wheezing.  Abdominal:     General: Bowel sounds are normal. There is no  distension.     Palpations: Abdomen is soft. There is no mass.     Tenderness: There is no abdominal tenderness. There is no guarding or rebound.  Musculoskeletal:        General: No tenderness. Normal range of motion.     Cervical back: Normal range of motion and neck supple.  Skin:    General: Skin is warm.  Neurological:     Mental Status: She is alert and oriented to person, place, and time.  Psychiatric:        Mood and Affect: Affect normal.    LABORATORY DATA:  I have reviewed the data as listed    Component Value Date/Time   NA 138 10/24/2020 1316   NA 141 01/25/2020 1530   K 4.0 10/24/2020 1316   CL 107 10/24/2020 1316   CO2 23 10/24/2020 1316   GLUCOSE 100 (H) 10/24/2020 1316   BUN 9 10/24/2020 1316   BUN 19 01/25/2020 1530   CREATININE 0.50 10/24/2020 1316   CALCIUM 8.5 (L) 10/24/2020 1316   PROT 7.2 10/24/2020 1316   ALBUMIN 3.5 10/24/2020 1316   AST 18  10/24/2020 1316   ALT 11 10/24/2020 1316   ALKPHOS 183 (H) 10/24/2020 1316   BILITOT 0.5 10/24/2020 1316   GFRNONAA >60 10/24/2020 1316   GFRAA NOT CALCULATED 04/17/2020 1352    No results found for: SPEP, UPEP  Lab Results  Component Value Date   WBC 3.8 (L) 10/24/2020   NEUTROABS 1.2 (L) 10/24/2020   HGB 10.3 (L) 10/24/2020   HCT 32.8 (L) 10/24/2020   MCV 84.3 10/24/2020   PLT 287 10/24/2020      Chemistry      Component Value Date/Time   NA 138 10/24/2020 1316   NA 141 01/25/2020 1530   K 4.0 10/24/2020 1316   CL 107 10/24/2020 1316   CO2 23 10/24/2020 1316   BUN 9 10/24/2020 1316   BUN 19 01/25/2020 1530   CREATININE 0.50 10/24/2020 1316      Component Value Date/Time   CALCIUM 8.5 (L) 10/24/2020 1316   ALKPHOS 183 (H) 10/24/2020 1316   AST 18 10/24/2020 1316   ALT 11 10/24/2020 1316   BILITOT 0.5 10/24/2020 1316       RADIOGRAPHIC STUDIES: I have personally reviewed the radiological images as listed and agreed with the findings in the report. No results found.   ASSESSMENT & PLAN:  IgA myeloma ([place]) #Multiple myeloma stage III high-risk cytogenetics-status post KRD-VGPR ; status post autologousstem cell transplant on 06/15/18. FEB 2021- [facility]- NEGATIVE [[facility],Dr.G on 11/22/2019]. PET scan-August 2021-no evidence of any recurrent myeloma. April 2022- Myeloma panel; kappa lambda light chain ratio -negative/normal.   #Continue to hold Revlimid maintenance given patient's multiple medical issues-especially recent pneumonia/consideration of radioactive iodine  #Hyperthyroidism/goiter-currently on methimazole as per Dr. Gabriel Carina; endocrinology; awaiting radioactive iodine  # chronic respiratory failure- sec to COPD/diastolic CHF on diuretics nebulizer home O2; continue follow-up with pulmonary; Dr.A & cardiology-recent pneumonia..   # Bone lesions /last zometa on 11/27/2017.  Holding Zometa secondary to poor dentition; OK to proceed with dental extraction.   Stable  # # COVID EVUSHELD PROPHYLAXIS: Given the immunosuppressive effects of treatments I would recommend EVUSHELD prophylaxis.  IM injection x2 [same time]-cutdown risk of Covid infection/next 6 months. Patient is vaccinated to [place].    # DISPOSITION:  # Follow up 2 month- MD; labs-cbc//cmp;MM panel; K/L light chains;Dr.B     Orders Placed This Encounter  Procedures  .  CBC with Differential    Standing Status:   Future    Standing Expiration Date:   10/24/2021  . Comprehensive metabolic panel    Standing Status:   Future    Standing Expiration Date:   10/24/2021  . Multiple Myeloma Panel (SPEP&IFE w/QIG)    Standing Status:   Future    Standing Expiration Date:   10/24/2021  . Kappa/lambda light chains    Standing Status:   Future    Standing Expiration Date:   10/24/2021  . Ambulatory Referral for Evusheld    Referral Priority:   Routine    Referral Type:   Consultation    Referral Reason:   Specialty Services Required    Number of Visits Requested:   1   All questions were answered. The patient knows to call the clinic with any problems, questions or concerns.      [name], MD 10/29/2020 7:54 AM

## 2020-10-24 NOTE — Assessment & Plan Note (Addendum)
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 FEB 2021- [facility]- NEGATIVE [[facility],Dr.G on 11/22/2019]. PET scan-August 2021-no evidence of any recurrent myeloma. April 2022- Myeloma panel; kappa lambda light chain ratio -negative/normal.   #Continue to hold Revlimid maintenance given patient's multiple medical issues-especially recent pneumonia/consideration of radioactive iodine  #Hyperthyroidism/goiter-currently on methimazole as per Dr. Gabriel Carina; endocrinology; awaiting radioactive iodine  # chronic respiratory failure- sec to COPD/diastolic CHF on diuretics nebulizer home O2; continue follow-up with pulmonary; Dr.A & cardiology-recent pneumonia..   # Bone lesions /last zometa on 11/27/2017.  Holding Zometa secondary to poor dentition; OK to proceed with dental extraction.  Stable  # # COVID EVUSHELD PROPHYLAXIS: Given the immunosuppressive effects of treatments I would recommend EVUSHELD prophylaxis.  IM injection x2 [same time]-cutdown risk of Covid infection/next 6 months. Patient is vaccinated to [place].    # DISPOSITION:  # Follow up 2 month- MD; labs-cbc//cmp;MM panel; K/L light chains;Dr.B

## 2020-10-26 ENCOUNTER — Other Ambulatory Visit: Payer: Self-pay | Admitting: Family

## 2020-10-27 ENCOUNTER — Other Ambulatory Visit: Payer: Self-pay | Admitting: Internal Medicine

## 2020-10-27 ENCOUNTER — Telehealth: Payer: Self-pay | Admitting: Adult Health

## 2020-10-27 DIAGNOSIS — C9002 Multiple myeloma in relapse: Secondary | ICD-10-CM

## 2020-10-27 LAB — KAPPA/LAMBDA LIGHT CHAINS
Kappa free light chain: 23.1 mg/L — ABNORMAL HIGH (ref 3.3–19.4)
Kappa, lambda light chain ratio: 0.63 (ref 0.26–1.65)
Lambda free light chains: 36.6 mg/L — ABNORMAL HIGH (ref 5.7–26.3)

## 2020-10-27 LAB — MULTIPLE MYELOMA PANEL, SERUM
Albumin SerPl Elph-Mcnc: 3.4 g/dL (ref 2.9–4.4)
Albumin/Glob SerPl: 1.1 (ref 0.7–1.7)
Alpha 1: 0.2 g/dL (ref 0.0–0.4)
Alpha2 Glob SerPl Elph-Mcnc: 0.8 g/dL (ref 0.4–1.0)
B-Globulin SerPl Elph-Mcnc: 1.2 g/dL (ref 0.7–1.3)
Gamma Glob SerPl Elph-Mcnc: 1.2 g/dL (ref 0.4–1.8)
Globulin, Total: 3.3 g/dL (ref 2.2–3.9)
IgA: 354 mg/dL — ABNORMAL HIGH (ref 87–352)
IgG (Immunoglobin G), Serum: 1244 mg/dL (ref 586–1602)
IgM (Immunoglobulin M), Srm: 63 mg/dL (ref 26–217)
Total Protein ELP: 6.7 g/dL (ref 6.0–8.5)

## 2020-10-27 NOTE — Telephone Encounter (Signed)
I called patient to discuss Evusheld, a long acting monoclonal antibody injection administered to patients with decreased immune systems or intolerance/allergy to the COVID 19 vaccine as COVID19 prevention.    Unable to reach patient.  Unable to leave voice mail.    Wilber Bihari, NP

## 2020-10-29 ENCOUNTER — Other Ambulatory Visit: Payer: Self-pay | Admitting: Adult Health

## 2020-10-29 DIAGNOSIS — C9000 Multiple myeloma not having achieved remission: Secondary | ICD-10-CM

## 2020-10-29 NOTE — Progress Notes (Signed)
I connected by phone with Brandi [name] and daughter, Brandi [name] on 10/29/2020, 9:59 AM to discuss the potential use of a new treatment, tixagevimab/cilgavimab, for pre-exposure prophylaxis for prevention of coronavirus disease 2019 (COVID-19) caused by the SARS-CoV-2 virus.  This patient is a 70 y.o. female that meets the FDA criteria for Emergency Use Authorization of tixagevimab/cilgavimab for pre-exposure prophylaxis of COVID-19 disease. Pt meets following criteria:  Age >12 yr and weight > 40kg  Not currently infected with SARS-CoV-2 and has no known recent exposure to an individual infected with SARS-CoV-2 AND o Who has moderate to severe immune compromise due to a medical condition or receipt of immunosuppressive medications or treatments and may not mount an adequate immune response to COVID-19 vaccination or  o Vaccination with any available COVID-19 vaccine, according to the approved or authorized schedule, is not recommended due to a history of severe adverse reaction (e.g., severe allergic reaction) to a COVID-19 vaccine(s) and/or COVID-19 vaccine component(s).  o Patient meets the following definition of mod-severe immune compromised status: 4. Lung transplants, other solid organ transplant recipients on continual belatacept therapy, or multiple myeloma (actively receiving treatment)  I have spoken and communicated the following to the patient or parent/caregiver regarding COVID monoclonal antibody treatment:  1. FDA has authorized the emergency use of tixagevimab/cilgavimab for the pre-exposure prophylaxis of COVID-19 in patients with moderate-severe immunocompromised status, who meet above EUA criteria.  2. The significant known and potential risks and benefits of COVID monoclonal antibody, and the extent to which such potential risks and benefits are unknown.  3. Information on available alternative treatments and the risks and benefits of those alternatives, including clinical  trials.  4. The patient or parent/caregiver has the option to accept or refuse COVID monoclonal antibody treatment.  After reviewing this information with the patient/patient representative agree to receive tixagevimab/cilgavimab.  High priority schedule message sent.    Scot Dock, NP, 10/29/2020, 9:59 AM

## 2020-10-30 ENCOUNTER — Inpatient Hospital Stay: Payer: Medicare Other

## 2020-10-30 DIAGNOSIS — C9002 Multiple myeloma in relapse: Secondary | ICD-10-CM | POA: Diagnosis not present

## 2020-10-30 DIAGNOSIS — F17210 Nicotine dependence, cigarettes, uncomplicated: Secondary | ICD-10-CM | POA: Diagnosis not present

## 2020-10-30 DIAGNOSIS — Z298 Encounter for other specified prophylactic measures: Secondary | ICD-10-CM | POA: Diagnosis present

## 2020-10-30 DIAGNOSIS — C9000 Multiple myeloma not having achieved remission: Secondary | ICD-10-CM

## 2020-10-30 DIAGNOSIS — Z9225 Personal history of immunosupression therapy: Secondary | ICD-10-CM | POA: Diagnosis not present

## 2020-10-30 DIAGNOSIS — Z9221 Personal history of antineoplastic chemotherapy: Secondary | ICD-10-CM | POA: Diagnosis not present

## 2020-10-30 MED ORDER — CILGAVIMAB (PART OF EVUSHELD) INJECTION
300.0000 mg | Freq: Once | INTRAMUSCULAR | Status: AC
  Administered 2020-10-30: 300 mg via INTRAMUSCULAR
  Filled 2020-10-30: qty 3

## 2020-10-30 MED ORDER — TIXAGEVIMAB (PART OF EVUSHELD) INJECTION
300.0000 mg | Freq: Once | INTRAMUSCULAR | Status: AC
  Administered 2020-10-30: 300 mg via INTRAMUSCULAR
  Filled 2020-10-30: qty 3

## 2020-10-30 NOTE — Progress Notes (Signed)
Consent obtained and information about Evusheld given to patient

## 2020-12-04 ENCOUNTER — Other Ambulatory Visit: Payer: Self-pay | Admitting: Family

## 2020-12-11 ENCOUNTER — Other Ambulatory Visit: Payer: Self-pay | Admitting: Family

## 2020-12-24 ENCOUNTER — Telehealth: Payer: Self-pay

## 2020-12-24 ENCOUNTER — Inpatient Hospital Stay (HOSPITAL_BASED_OUTPATIENT_CLINIC_OR_DEPARTMENT_OTHER): Payer: Medicare Other | Admitting: Internal Medicine

## 2020-12-24 ENCOUNTER — Inpatient Hospital Stay: Payer: Medicare Other | Attending: Internal Medicine

## 2020-12-24 ENCOUNTER — Encounter: Payer: Self-pay | Admitting: Internal Medicine

## 2020-12-24 DIAGNOSIS — Z515 Encounter for palliative care: Secondary | ICD-10-CM | POA: Diagnosis not present

## 2020-12-24 DIAGNOSIS — J019 Acute sinusitis, unspecified: Secondary | ICD-10-CM | POA: Insufficient documentation

## 2020-12-24 DIAGNOSIS — C9000 Multiple myeloma not having achieved remission: Secondary | ICD-10-CM

## 2020-12-24 DIAGNOSIS — J449 Chronic obstructive pulmonary disease, unspecified: Secondary | ICD-10-CM | POA: Diagnosis not present

## 2020-12-24 DIAGNOSIS — E0500 Thyrotoxicosis with diffuse goiter without thyrotoxic crisis or storm: Secondary | ICD-10-CM | POA: Diagnosis not present

## 2020-12-24 LAB — COMPREHENSIVE METABOLIC PANEL
ALT: 14 U/L (ref 0–44)
AST: 19 U/L (ref 15–41)
Albumin: 4 g/dL (ref 3.5–5.0)
Alkaline Phosphatase: 230 U/L — ABNORMAL HIGH (ref 38–126)
Anion gap: 8 (ref 5–15)
BUN: 11 mg/dL (ref 8–23)
CO2: 22 mmol/L (ref 22–32)
Calcium: 9.1 mg/dL (ref 8.9–10.3)
Chloride: 110 mmol/L (ref 98–111)
Creatinine, Ser: 0.54 mg/dL (ref 0.44–1.00)
GFR, Estimated: >60 mL/min (ref >60)
Glucose, Bld: 99 mg/dL (ref 70–99)
Potassium: 4 mmol/L (ref 3.5–5.1)
Sodium: 140 mmol/L (ref 135–145)
Total Bilirubin: 0.7 mg/dL (ref 0.3–1.2)
Total Protein: 7.6 g/dL (ref 6.5–8.1)

## 2020-12-24 LAB — CBC WITH DIFFERENTIAL/PLATELET
Abs Immature Granulocytes: 0.02 10*3/uL (ref 0.00–0.07)
Basophils Absolute: 0.1 10*3/uL (ref 0.0–0.1)
Basophils Relative: 1 %
Eosinophils Absolute: 0.3 10*3/uL (ref 0.0–0.5)
Eosinophils Relative: 4 %
HCT: 34.7 % — ABNORMAL LOW (ref 36.0–46.0)
Hemoglobin: 10.9 g/dL — ABNORMAL LOW (ref 12.0–15.0)
Immature Granulocytes: 0 %
Lymphocytes Relative: 42 %
Lymphs Abs: 3.1 10*3/uL (ref 0.7–4.0)
MCH: 26.7 pg (ref 26.0–34.0)
MCHC: 31.4 g/dL (ref 30.0–36.0)
MCV: 85 fL (ref 80.0–100.0)
Monocytes Absolute: 0.5 10*3/uL (ref 0.1–1.0)
Monocytes Relative: 6 %
Neutro Abs: 3.5 10*3/uL (ref 1.7–7.7)
Neutrophils Relative %: 47 %
Platelets: 211 10*3/uL (ref 150–400)
RBC: 4.08 MIL/uL (ref 3.87–5.11)
RDW: 14.2 % (ref 11.5–15.5)
WBC: 7.4 10*3/uL (ref 4.0–10.5)
nRBC: 0 % (ref 0.0–0.2)

## 2020-12-24 NOTE — Progress Notes (Signed)
[place] OFFICE PROGRESS NOTE  Patient Care Team: [facility], [place] as PCP - General (General Practice) End, Harrell Gave, MD as PCP - Cardiology (Cardiology) [name], [name] [name], MD as Consulting Physician (Hematology and Oncology) Ottie Glazier, MD as Consulting Physician (Pulmonary Disease) Gabriel Carina Betsey Holiday, MD as Consulting Physician (Endocrinology)  Cancer Staging No matching staging information was found for the patient.   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 No morphologic support for residual myeloma disease. Negative for minimal residual disease by MM-MRD flow cytometry; HOLD REVLIMID [leg swelling]; AUG 2021-PET scan negative for myeloma; continue to hold Revlimid  # MARCH 2021-diastolic congestive heart failure [Dr.End]  # BMBx- OCT 2021- BMBx- 5% plasmacytosis-however this appears to be more polyclonal rather than monoclonal-not explain patient worsening anemia.  #November 2021-hyperthyroidism/goiter-  [D.Bennett/Dr.Solum]-methimazole.  --------------------------  # 12/12- RIGHT JUGULAR DVT-x 325mn xarelto; finished April 2020; September 2020-EGD/dysphagia; Dr. AnVicente Males# Acute renal failure [Dr.Singh; Proteinuria 1.5gm/day ]; acyclovir/Asprin ------------------------------------------------------------------------------------------------------------   DIAGNOSIS: _0  MULTIPLE MYELOMA  STAGE: III/HIGH RISK ;GOALS: CONTROL  CURRENT/MOST RECENT THERAPY-maintenance Revlimid- on HOLD    [place] -  Chemotherapy   The patient had dexamethasone (DECADRON) 4 MG tablet, 1 of 1 cycle, Start date: --, End date: -- palonosetron (ALOXI) injection 0.25 mg, 0.25 mg, Intravenous,  Once, 6 of 7 cycles Administration: 0.25 mg (11/21/2017), 0.25 mg (11/29/2017), 0.25 mg (12/05/2017), 0.25 mg (12/21/2017), 0.25 mg (12/28/2017), 0.25 mg (01/04/2018), 0.25 mg (01/18/2018), 0.25 mg (01/25/2018), 0.25 mg (02/01/2018), 0.25 mg (02/15/2018), 0.25 mg (02/22/2018), 0.25 mg (03/01/2018), 0.25 mg (03/15/2018), 0.25 mg (03/22/2018), 0.25 mg (03/30/2018), 0.25 mg (04/13/2018), 0.25 mg (04/20/2018), 0.25 mg (04/27/2018) cyclophosphamide (CYTOXAN) 500 mg in sodium chloride 0.9 % 250 mL chemo infusion, 540 mg, Intravenous,  Once, 6 of 7 cycles Administration: 500 mg (11/21/2017), 500 mg (11/29/2017), 500 mg (12/05/2017), 500 mg (12/28/2017), 500 mg (01/04/2018), 500 mg (01/18/2018), 500 mg (01/25/2018), 500 mg (02/01/2018), 500 mg (02/15/2018), 500 mg (02/22/2018), 500 mg (03/01/2018), 500 mg (03/15/2018), 500 mg (03/30/2018), 500 mg (04/13/2018), 500 mg (04/20/2018), 500 mg (04/27/2018) carfilzomib (KYPROLIS) 36 mg in dextrose 5 % 50 mL chemo infusion, 20 mg/m2 = 36 mg, Intravenous, Once, 6 of 7 cycles Administration: 36 mg (11/21/2017), 36 mg (11/22/2017), 60 mg (11/29/2017), 60 mg (11/30/2017), 60 mg (12/05/2017), 60 mg (12/06/2017), 60 mg (12/21/2017), 60 mg (12/22/2017), 60 mg (12/28/2017), 60 mg (12/29/2017), 60 mg (01/04/2018), 60 mg (01/05/2018), 60 mg  (01/18/2018), 60 mg (01/19/2018), 60 mg (01/25/2018), 60 mg (01/27/2018), 60 mg (02/01/2018), 60 mg (02/02/2018), 60 mg (02/15/2018), 60 mg (02/16/2018), 60 mg (02/22/2018), 60 mg (03/01/2018), 60 mg (03/02/2018), 60 mg (03/15/2018), 60 mg (03/16/2018), 60 mg (03/23/2018), 60 mg (03/30/2018), 60  mg (03/31/2018), 60 mg (04/13/2018), 60 mg (04/14/2018), 60 mg (04/20/2018), 60 mg (04/21/2018), 60 mg (04/27/2018)  for chemotherapy treatment.     INTERVAL HISTORY:  Brandi [name] 70 y.o.  female pleasant patient above history of relapsed multiple myeloma--most recently not on maintenance Revlimid [on since Feb 2021] is here for follow-up.  Patient has not had any recent hospitalizations.  Patient-is considering total thyroidectomy for goiter/hyperthyroidism.  She continues to be on methimazole.  Her breathing is improved.  She is Currently oxygen only as needed.   Review of Systems  Constitutional: Positive for malaise/fatigue. Negative for chills, diaphoresis and fever.  HENT: Negative for nosebleeds and sore throat.   Eyes: Negative for double vision.  Respiratory: Positive for shortness of breath. Negative for hemoptysis.   Cardiovascular: Negative for chest pain, palpitations and orthopnea.  Gastrointestinal: Negative for abdominal pain, blood in stool, constipation, heartburn and melena.  Genitourinary: Negative for dysuria, frequency and urgency.  Musculoskeletal: Positive for back pain and myalgias.  Skin: Negative.  Negative for itching and rash.  Neurological: Negative for dizziness, tingling, focal weakness, weakness and headaches.  Endo/Heme/Allergies: Does not bruise/bleed easily.  Psychiatric/Behavioral: Negative for depression. The patient has insomnia. The patient is not nervous/anxious.     PAST MEDICAL HISTORY :  Past Medical History:  Diagnosis Date  . Anxiety   . At risk for cardiac dysfunction   . CHF (congestive heart failure) ([place])   . COPD (chronic obstructive pulmonary disease) ([place])    . Hypertension   . Hyperthyroidism   . Hypokalemia   . IgA myeloma ([place])   . Multiple myeloma ([place])   . Pneumonia   . Red blood cell antibody positive, compatible PRBC difficult to obtain   . S/P autologous bone marrow transplantation ([place])     PAST SURGICAL HISTORY :   Past Surgical History:  Procedure Laterality Date  . ESOPHAGOGASTRODUODENOSCOPY (EGD) WITH PROPOFOL N/A 03/30/2019   Procedure: ESOPHAGOGASTRODUODENOSCOPY (EGD) WITH PROPOFOL;  Surgeon: Jonathon Bellows, MD;  Location[facility] ENDOSCOPY;  Service: Gastroenterology;  Laterality: N/A;  . IR FLUORO GUIDE PORT INSERTION RIGHT  12/16/2017    FAMILY HISTORY :   Family History  Problem Relation Age of Onset  . Pneumonia Mother   . Seizures Father     SOCIAL HISTORY:   Social History   Tobacco Use  . Smoking status: Current Every Day Smoker    Packs/day: 0.50    Types: Cigarettes  . Smokeless tobacco: Never Used  . Tobacco comment: 2 cigarettes QOD  Vaping Use  . Vaping Use: Never used  Substance Use Topics  . Alcohol use: No  . Drug use: No    ALLERGIES:  has No Known Allergies.  MEDICATIONS:  Current Outpatient Medications  Medication Sig Dispense Refill  . acyclovir (ZOVIRAX) 400 MG tablet TAKE 1 TABLET BY MOUTH ONCE DAILY TO PREVENT SHINGLES (Patient taking differently: Take 400 mg by mouth daily. TAKE 1 TABLET BY MOUTH ONCE DAILY TO  PREVENT  SHINGLES) 30 tablet 0  . albuterol (VENTOLIN HFA) 108 (90 Base) MCG/ACT inhaler Inhale 2 puffs into the lungs every 6 (six) hours as needed for wheezing or shortness of breath. 8 g 3  . aspirin EC 81 MG EC tablet Take 1 tablet (81 mg total) by mouth daily. 90 tablet 3  . ergocalciferol (VITAMIN D2) 1.25 MG (50000 UT) capsule Take 1 capsule (50,000 Units total) by mouth once a week. 12 capsule 2  . feeding supplement, ENSURE ENLIVE, (ENSURE ENLIVE) LIQD  Take 237 mLs by mouth 2 (two) times daily between meals. 60 Bottle 0  . Fluticasone-Salmeterol (ADVAIR DISKUS) 500-50  MCG/DOSE AEPB Inhale 1 puff into the lungs 2 (two) times daily. 1 each 3  . furosemide (LASIX) 40 MG tablet Take 1 tablet (40 mg total) by mouth daily. 90 tablet 3  . Ipratropium-Albuterol (COMBIVENT) 20-100 MCG/ACT AERS respimat Inhale into the lungs.    [name] ipratropium-albuterol (DUONEB) 0.5-2.5 (3) MG/3ML SOLN Take 3 mLs by nebulization every 4 (four) hours as needed. 360 mL 3  . methimazole (TAPAZOLE) 10 MG tablet Take 2 tablets by mouth once daily (Patient taking differently: Take 20 mg by mouth daily.) 60 tablet 0  . metoprolol succinate (TOPROL-XL) 25 MG 24 hr tablet TAKE ONE AND ONE-HALF TABLETS BY MOUTH ONCE DAILY 45 tablet 0  . mirtazapine (REMERON) 15 MG tablet Take 1 tablet (15 mg total) by mouth at bedtime. 30 tablet 4  . montelukast (SINGULAIR) 10 MG tablet Take 1 tablet (10 mg total) by mouth at bedtime. 30 tablet 3  . pantoprazole (PROTONIX) 40 MG tablet Take 1 tablet (40 mg total) by mouth daily. 90 tablet 1  . polyethylene glycol (MIRALAX / GLYCOLAX) packet Take 17 g by mouth daily as needed for mild constipation. 14 each 0  . potassium chloride SA (KLOR-CON) 20 MEQ tablet Take 1 tablet (20 mEq total) by mouth 2 (two) times daily. 60 tablet 6  . spironolactone (ALDACTONE) 25 MG tablet Take 1 tablet (25 mg total) by mouth daily. 90 tablet 1  . amoxicillin (AMOXIL) 500 MG capsule Take 1 capsule (500 mg total) by mouth every 8 (eight) hours. (Patient not taking: Reported on 12/24/2020) 17 capsule 0  . guaiFENesin-codeine 100-10 MG/5ML syrup TAKE 5 ML BY MOUTH  THREE TIMES DAILY AS NEEDED FOR COUGH. MAY CAUSE DROWSINESS. DO NOT DRIVE WITH THIS MEDICATION (Patient not taking: Reported on 12/24/2020) 120 mL 0   No current facility-administered medications for this visit.   Facility-Administered Medications Ordered in Other Visits  Medication Dose Route Frequency Provider Last Rate Last Admin  . 0.9 %  sodium chloride infusion   Intravenous Continuous Monia Sabal, PA-C      . sodium  chloride flush (NS) 0.9 % injection 10 mL  10 mL Intracatheter PRN [name], MD   10 mL at 02/23/18 1400    PHYSICAL EXAMINATION: ECOG PERFORMANCE STATUS: 1 - Symptomatic but completely ambulatory  BP 135/71 (BP Location: Right Arm, Patient Position: Sitting)   Pulse 71   Temp (!) 96.7 F (35.9 C) (Tympanic)   Wt 65.3 kg   SpO2 99%   BMI 26.34 kg/m   Filed Weights   12/24/20 1426  Weight: 65.3 kg    Physical Exam Constitutional:      Comments: patient in a wheelchair.  She is accompanied by family..  She is on 2 L nasal cannula.  HENT:     Head: Normocephalic and atraumatic.  Eyes:     Pupils: Pupils are equal, round, and reactive to light.  Neck:     Comments: Goiter noted. Cardiovascular:     Rate and Rhythm: Normal rate and regular rhythm.  Pulmonary:     Effort: Pulmonary effort is normal. No respiratory distress.     Breath sounds: Normal breath sounds. No wheezing.  Abdominal:     General: Bowel sounds are normal. There is no distension.     Palpations: Abdomen is soft. There is no mass.     Tenderness: There  is no abdominal tenderness. There is no guarding or rebound.  Musculoskeletal:        General: No tenderness. Normal range of motion.     Cervical back: Normal range of motion and neck supple.  Skin:    General: Skin is warm.  Neurological:     Mental Status: She is alert and oriented to person, place, and time.  Psychiatric:        Mood and Affect: Affect normal.    LABORATORY DATA:  I have reviewed the data as listed    Component Value Date/Time   NA 140 12/24/2020 1401   NA 141 01/25/2020 1530   K 4.0 12/24/2020 1401   CL 110 12/24/2020 1401   CO2 22 12/24/2020 1401   GLUCOSE 99 12/24/2020 1401   BUN 11 12/24/2020 1401   BUN 19 01/25/2020 1530   CREATININE 0.54 12/24/2020 1401   CALCIUM 9.1 12/24/2020 1401   PROT 7.6 12/24/2020 1401   ALBUMIN 4.0 12/24/2020 1401   AST 19 12/24/2020 1401   ALT 14 12/24/2020 1401   ALKPHOS  230 (H) 12/24/2020 1401   BILITOT 0.7 12/24/2020 1401   GFRNONAA >60 12/24/2020 1401   GFRAA NOT CALCULATED 04/17/2020 1352    No results found for: SPEP, UPEP  Lab Results  Component Value Date   WBC 7.4 12/24/2020   NEUTROABS 3.5 12/24/2020   HGB 10.9 (L) 12/24/2020   HCT 34.7 (L) 12/24/2020   MCV 85.0 12/24/2020   PLT 211 12/24/2020      Chemistry      Component Value Date/Time   NA 140 12/24/2020 1401   NA 141 01/25/2020 1530   K 4.0 12/24/2020 1401   CL 110 12/24/2020 1401   CO2 22 12/24/2020 1401   BUN 11 12/24/2020 1401   BUN 19 01/25/2020 1530   CREATININE 0.54 12/24/2020 1401      Component Value Date/Time   CALCIUM 9.1 12/24/2020 1401   ALKPHOS 230 (H) 12/24/2020 1401   AST 19 12/24/2020 1401   ALT 14 12/24/2020 1401   BILITOT 0.7 12/24/2020 1401       RADIOGRAPHIC STUDIES: I have personally reviewed the radiological images as listed and agreed with the findings in the report. No results found.   ASSESSMENT & PLAN:  IgA myeloma ([place]) #Multiple myeloma stage III high-risk cytogenetics-status post KRD-VGPR ; status post autologousstem cell transplant on 06/15/18. FEB 2021- [facility]- NEGATIVE [[facility],Dr.G on 11/22/2019]. PET scan-August 2021-no evidence of any recurrent myeloma. April 2022- Myeloma panel; kappa lambda light chain ratio -negative/normal.   #Continue to hold Revlimid maintenance given patient's multiple medical issues-especially recent pneumonia/upcoming thyroid surgery.  #Hyperthyroidism/goiter-currently on methimazole as per Dr. Gabriel Carina; endocrinology; upcoming thyroid surgery.  Stable  # chronic respiratory failure- sec to COPD/diastolic CHF on diuretics nebulizer home O2; continue follow-up with pulmonary; Dr.A & cardiology.  Stable  # Bone lesions /last zometa on 11/27/2017.  Holding Zometa secondary to poor dentition; OK to proceed with dental extraction.  We will make a referral to dentistry.   # DISPOSITION:  # refer to Dr. Johnnette Litter- dentist re:  Zometa/ dental clearance # Follow up 3 month- MD; labs-cbc//cmp;MM panel; K/L light chains;Dr.B     Orders Placed This Encounter  Procedures  . CBC    Standing Status:   Future    Standing Expiration Date:   12/24/2021  . Comprehensive metabolic panel    Standing Status:   Future    Standing Expiration Date:   12/24/2021  .  Multiple Myeloma Panel (SPEP&IFE w/QIG)    Standing Status:   Future    Standing Expiration Date:   12/24/2021  . Kappa/lambda light chains    Standing Status:   Future    Standing Expiration Date:   12/24/2021  . Ambulatory referral to Dentistry    Referral Priority:   Routine    Referral Type:   Consultation    Referral Reason:   Specialty Services Required    Referred to Provider:   Johnnette Litter, MD    Requested Specialty:   Dentistry    Number of Visits Requested:   1   All questions were answered. The patient knows to call the clinic with any problems, questions or concerns.      [name], MD 12/25/2020 8:11 AM

## 2020-12-24 NOTE — Assessment & Plan Note (Addendum)
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 FEB 2021- [facility]D- NEGATIVE [[facility],Dr.G on 11/22/2019]. PET scan-August 2021-no evidence of any recurrent myeloma. April 2022- Myeloma panel; kappa lambda light chain ratio -negative/normal.   #Continue to hold Revlimid maintenance given patient's multiple medical issues-especially recent pneumonia/upcoming thyroid surgery.  #Hyperthyroidism/goiter-currently on methimazole as per Dr. Gabriel Carina; endocrinology; upcoming thyroid surgery.  Stable  # chronic respiratory failure- sec to COPD/diastolic CHF on diuretics nebulizer home O2; continue follow-up with pulmonary; Dr.A & cardiology.  Stable  # Bone lesions /last zometa on 11/27/2017.  Holding Zometa secondary to poor dentition; OK to proceed with dental extraction.  We will make a referral to dentistry.   # DISPOSITION:  # refer to Dr. Johnnette Litter- dentist re:  Zometa/ dental clearance # Follow up 3 month- MD; labs-cbc//cmp;MM panel; K/L light chains;Dr.B

## 2020-12-24 NOTE — Progress Notes (Signed)
Patient here for oncology follow-up appointment,  concerns of cough

## 2020-12-25 LAB — KAPPA/LAMBDA LIGHT CHAINS
Kappa free light chain: 16.8 mg/L (ref 3.3–19.4)
Kappa, lambda light chain ratio: 0.41 (ref 0.26–1.65)
Lambda free light chains: 41.1 mg/L — ABNORMAL HIGH (ref 5.7–26.3)

## 2020-12-28 LAB — MULTIPLE MYELOMA PANEL, SERUM
Albumin SerPl Elph-Mcnc: 3.9 g/dL (ref 2.9–4.4)
Albumin/Glob SerPl: 1.2 (ref 0.7–1.7)
Alpha 1: 0.2 g/dL (ref 0.0–0.4)
Alpha2 Glob SerPl Elph-Mcnc: 0.7 g/dL (ref 0.4–1.0)
B-Globulin SerPl Elph-Mcnc: 1.3 g/dL (ref 0.7–1.3)
Gamma Glob SerPl Elph-Mcnc: 1.3 g/dL (ref 0.4–1.8)
Globulin, Total: 3.5 g/dL (ref 2.2–3.9)
IgA: 430 mg/dL — ABNORMAL HIGH (ref 87–352)
IgG (Immunoglobin G), Serum: 1268 mg/dL (ref 586–1602)
IgM (Immunoglobulin M), Srm: 48 mg/dL (ref 26–217)
Total Protein ELP: 7.4 g/dL (ref 6.0–8.5)

## 2021-01-12 ENCOUNTER — Telehealth: Payer: Self-pay

## 2021-01-12 NOTE — Telephone Encounter (Signed)
Dr. Johnnette Litter office reached out stating they had the pt scheduled for 6/6 at 9am for a dental clearance. However, pt was a no show. When the office reached out pt stated "she forgot she had another appointment at that time and forgot to call to cancel appointment" Dentist office said they will try re-scheduling one more time to have clearance done. Wanted [place] to have a heads up.

## 2021-01-14 ENCOUNTER — Inpatient Hospital Stay (HOSPITAL_BASED_OUTPATIENT_CLINIC_OR_DEPARTMENT_OTHER): Payer: Medicare Other | Admitting: Hospice and Palliative Medicine

## 2021-01-14 ENCOUNTER — Inpatient Hospital Stay: Payer: Medicare Other | Admitting: *Deleted

## 2021-01-14 ENCOUNTER — Telehealth: Payer: Self-pay | Admitting: *Deleted

## 2021-01-14 ENCOUNTER — Other Ambulatory Visit: Payer: Self-pay

## 2021-01-14 VITALS — BP 137/84 | HR 65 | Temp 98.4°F | Resp 16 | Wt 153.0 lb

## 2021-01-14 DIAGNOSIS — C9002 Multiple myeloma in relapse: Secondary | ICD-10-CM

## 2021-01-14 DIAGNOSIS — E039 Hypothyroidism, unspecified: Secondary | ICD-10-CM

## 2021-01-14 DIAGNOSIS — C9000 Multiple myeloma not having achieved remission: Secondary | ICD-10-CM | POA: Diagnosis not present

## 2021-01-14 DIAGNOSIS — J0110 Acute frontal sinusitis, unspecified: Secondary | ICD-10-CM | POA: Diagnosis not present

## 2021-01-14 LAB — CBC WITH DIFFERENTIAL/PLATELET
Abs Immature Granulocytes: 0.02 10*3/uL (ref 0.00–0.07)
Basophils Absolute: 0 10*3/uL (ref 0.0–0.1)
Basophils Relative: 1 %
Eosinophils Absolute: 0.4 10*3/uL (ref 0.0–0.5)
Eosinophils Relative: 6 %
HCT: 34.6 % — ABNORMAL LOW (ref 36.0–46.0)
Hemoglobin: 10.9 g/dL — ABNORMAL LOW (ref 12.0–15.0)
Immature Granulocytes: 0 %
Lymphocytes Relative: 37 %
Lymphs Abs: 2.7 10*3/uL (ref 0.7–4.0)
MCH: 27 pg (ref 26.0–34.0)
MCHC: 31.5 g/dL (ref 30.0–36.0)
MCV: 85.9 fL (ref 80.0–100.0)
Monocytes Absolute: 0.5 10*3/uL (ref 0.1–1.0)
Monocytes Relative: 6 %
Neutro Abs: 3.7 10*3/uL (ref 1.7–7.7)
Neutrophils Relative %: 50 %
Platelets: 189 10*3/uL (ref 150–400)
RBC: 4.03 MIL/uL (ref 3.87–5.11)
RDW: 14.6 % (ref 11.5–15.5)
WBC: 7.3 10*3/uL (ref 4.0–10.5)
nRBC: 0 % (ref 0.0–0.2)

## 2021-01-14 LAB — COMPREHENSIVE METABOLIC PANEL
ALT: 10 U/L (ref 0–44)
AST: 16 U/L (ref 15–41)
Albumin: 3.8 g/dL (ref 3.5–5.0)
Alkaline Phosphatase: 202 U/L — ABNORMAL HIGH (ref 38–126)
Anion gap: 7 (ref 5–15)
BUN: 9 mg/dL (ref 8–23)
CO2: 24 mmol/L (ref 22–32)
Calcium: 8.7 mg/dL — ABNORMAL LOW (ref 8.9–10.3)
Chloride: 110 mmol/L (ref 98–111)
Creatinine, Ser: 0.56 mg/dL (ref 0.44–1.00)
GFR, Estimated: >60 mL/min (ref >60)
Glucose, Bld: 107 mg/dL — ABNORMAL HIGH (ref 70–99)
Potassium: 3.8 mmol/L (ref 3.5–5.1)
Sodium: 141 mmol/L (ref 135–145)
Total Bilirubin: 0.4 mg/dL (ref 0.3–1.2)
Total Protein: 7.3 g/dL (ref 6.5–8.1)

## 2021-01-14 MED ORDER — PREDNISONE 10 MG PO TABS: ORAL_TABLET | ORAL | 0 refills | Status: DC

## 2021-01-14 MED ORDER — AMOXICILLIN-POT CLAVULANATE 875-125 MG PO TABS: 1.0000 | ORAL_TABLET | Freq: Two times a day (BID) | ORAL | 0 refills | Status: DC

## 2021-01-14 NOTE — Progress Notes (Signed)
Pt reports headache x2 weeks. States it feels like pressure. Top of head is sensitive to touch. She also states "it feels like my ears are blocked up". She believes she may have a "sinus infection". She has reported some coughing andcongestion as well. No fever.

## 2021-01-14 NOTE — Telephone Encounter (Signed)
Patient daughter Brandi [name] called reporting tha tpt is having daily headaches, cannot hear out of one of her ears and that she thinks patient has a sinus infection. She is asking for something to be sone for patient. Please advise

## 2021-01-14 NOTE — Progress Notes (Signed)
[facility]pt[facility] [place]  [phone] [phone]  Patient Care Team: [facility], [place] as PCP - General ([place]) End, Harrell Gave, MD as PCP - Cardiology (Cardiology) [name], [name] [name], MD as Consulting Physician (Hematology and Oncology) Ottie Glazier, MD as Consulting Physician (Pulmonary Disease) Gabriel Carina Betsey Holiday, MD as Consulting Physician (Endocrinology)   Name of the patient: Brandi [name]  [id number]  [birth year]   Date of visit: 01/14/21  Reason for Consult: Ms. Keimani Laufer is a 70 year old woman with multiple medical problems including hyperthyroidism/goiter on methimazole, COPD, diastolic dysfunction with history of CHF, and stage III multiple myeloma status post stem cell transplant with Revlimid maintenance currently on hold.  Patient last saw Dr. Rogue Bussing on 12/24/2020 at which time she seemed to be at baseline.  Patient was planning to pursue total thyroidectomy for hyperthyroidism/goiter.  Of note, patient was hospitalized in March 2022 with sepsis and acute respiratory failure from pneumonia.  Today, patient presents to [facility] with complaint of 2 weeks of persistent sinus congestion, rhinorrhea, and frontal pressure/headaches.  She says she feels like she cannot hear well and her ears are clogged.  She has occasional productive cough.  No fever or chills.  No worsening shortness of breath or wheezing.  She has O2 but has not needed it in months.  Denies any neurologic complaints. Denies recent fevers or illnesses. Denies any easy bleeding or bruising. Reports good appetite and denies weight loss. Denies chest pain. Denies any nausea, vomiting, constipation, or diarrhea. Denies urinary complaints. Patient offers no further specific complaints today.  PAST MEDICAL HISTORY: Past Medical History:  Diagnosis Date   Anxiety     At risk for cardiac dysfunction    CHF (congestive heart failure) (HCC)    COPD (chronic obstructive pulmonary disease) (HCC)    Hypertension    Hyperthyroidism    Hypokalemia    IgA myeloma (HCC)    Multiple myeloma (HCC)    Pneumonia    Red blood cell antibody positive, compatible PRBC difficult to obtain    S/P autologous bone marrow transplantation ([place])     PAST SURGICAL HISTORY:  Past Surgical History:  Procedure Laterality Date   ESOPHAGOGASTRODUODENOSCOPY (EGD) WITH PROPOFOL N/A 03/30/2019   Procedure: ESOPHAGOGASTRODUODENOSCOPY (EGD) WITH PROPOFOL;  Surgeon: Jonathon Bellows, MD;  Location: [facility];  Service: Gastroenterology;  Laterality: N/A;   IR FLUORO GUIDE PORT INSERTION RIGHT  12/16/2017    HEMATOLOGY/ONCOLOGY HISTORY:  Oncology History Overview Note  # SEP 2018- MULTIPLE MYELOMA IgALamda [2.5 gm/dl; K/L= 88/1298]; STAGE III [beta 2 microglobulin=5.5] [presented with acute renal failure; anemia; NO hypercalcemia; Skeletal survey-Normal]; BMBx- 45% plasma cells; FISH-POSITIVE 11:14 translocation.[STANDARD-high RISK]/cyto-Normal; SEP 2018- PET- L3 posterior element lesion.   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 No morphologic support for residual myeloma disease. Negative for minimal residual disease by MM-MRD flow cytometry; HOLD REVLIMID [leg swelling]; AUG 2021-PET scan negative for myeloma; continue to hold Revlimid  # MARCH 2021-diastolic congestive heart failure  [  Dr.End]  # BMBx- OCT 2021- BMBx- 5% plasmacytosis-however this appears to be more polyclonal rather than monoclonal-not explain patient worsening anemia.  #November 2021-hyperthyroidism/goiter- [D.Bennett/Dr.Solum]-methimazole.  --------------------------  # 12/12- RIGHT JUGULAR DVT-x 8mon xarelto; finished April 2020; September 2020-EGD/dysphagia; Dr. AVicente Males # Acute renal failure [Dr.Singh; Proteinuria 1.5gm/day ]; acyclovir/Asprin ------------------------------------------------------------------------------------------------------------   DIAGNOSIS: _0  MULTIPLE MYELOMA  STAGE: III/HIGH RISK ;GOALS: CONTROL  CURRENT/MOST RECENT THERAPY-maintenance Revlimid- on HOLD    IgA myeloma ([place]  11/21/2017 -  Chemotherapy   The patient had dexamethasone (DECADRON) 4 MG tablet, 1 of 1 cycle, Start date: --, End date: -- palonosetron (ALOXI) injection 0.25 mg, 0.25 mg, Intravenous,  Once, 6 of 7 cycles Administration: 0.25 mg (11/21/2017), 0.25 mg (11/29/2017), 0.25 mg (12/05/2017), 0.25 mg (12/21/2017), 0.25 mg (12/28/2017), 0.25 mg (01/04/2018), 0.25 mg (01/18/2018), 0.25 mg (01/25/2018), 0.25 mg (02/01/2018), 0.25 mg (02/15/2018), 0.25 mg (02/22/2018), 0.25 mg (03/01/2018), 0.25 mg (03/15/2018), 0.25 mg (03/22/2018), 0.25 mg (03/30/2018), 0.25 mg (04/13/2018), 0.25 mg (04/20/2018), 0.25 mg (04/27/2018) cyclophosphamide (CYTOXAN) 500 mg in sodium chloride 0.9 % 250 mL chemo infusion, 540 mg, Intravenous,  Once, 6 of 7 cycles Administration: 500 mg (11/21/2017), 500 mg (11/29/2017), 500 mg (12/05/2017), 500 mg (12/28/2017), 500 mg (01/04/2018), 500 mg (01/18/2018), 500 mg (01/25/2018), 500 mg (02/01/2018), 500 mg (02/15/2018), 500 mg (02/22/2018), 500 mg (03/01/2018), 500 mg (03/15/2018), 500 mg (03/30/2018), 500 mg (04/13/2018), 500 mg (04/20/2018), 500 mg (04/27/2018) carfilzomib (KYPROLIS) 36 mg in dextrose 5 % 50 mL chemo infusion, 20 mg/m2 = 36 mg, Intravenous, Once, 6 of 7 cycles Administration: 36 mg (11/21/2017), 36 mg (11/22/2017),  60 mg (11/29/2017), 60 mg (11/30/2017), 60 mg (12/05/2017), 60 mg (12/06/2017), 60 mg (12/21/2017), 60 mg (12/22/2017), 60 mg (12/28/2017), 60 mg (12/29/2017), 60 mg (01/04/2018), 60 mg (01/05/2018), 60 mg (01/18/2018), 60 mg (01/19/2018), 60 mg (01/25/2018), 60 mg (01/27/2018), 60 mg (02/01/2018), 60 mg (02/02/2018), 60 mg (02/15/2018), 60 mg (02/16/2018), 60 mg (02/22/2018), 60 mg (03/01/2018), 60 mg (03/02/2018), 60 mg (03/15/2018), 60 mg (03/16/2018), 60 mg (03/23/2018), 60 mg (03/30/2018), 60 mg (03/31/2018), 60 mg (04/13/2018), 60 mg (04/14/2018), 60 mg (04/20/2018), 60 mg (04/21/2018), 60 mg (04/27/2018)   for chemotherapy treatment.       ALLERGIES:  has No Known Allergies.  MEDICATIONS:  Current Outpatient Medications  Medication Sig Dispense Refill   acyclovir (ZOVIRAX) 400 MG tablet TAKE 1 TABLET BY MOUTH ONCE DAILY TO PREVENT SHINGLES (Patient taking differently: Take 400 mg by mouth daily. TAKE 1 TABLET BY MOUTH ONCE DAILY TO  PREVENT  SHINGLES) 30 tablet 0   albuterol (VENTOLIN HFA) 108 (90 Base) MCG/ACT inhaler Inhale 2 puffs into the lungs every 6 (six) hours as needed for wheezing or shortness of breath. 8 g 3   amoxicillin (AMOXIL) 500 MG capsule Take 1 capsule (500 mg total) by mouth every 8 (eight) hours. (Patient not taking: Reported on 12/24/2020) 17 capsule 0   aspirin EC 81 MG EC tablet Take 1 tablet (81 mg total) by mouth daily. 90 tablet 3   ergocalciferol (VITAMIN D2) 1.25 MG (50000 UT) capsule Take 1 capsule (50,000 Units total) by mouth once a week. 12 capsule 2   feeding supplement, ENSURE ENLIVE, (ENSURE ENLIVE) LIQD Take 237 mLs by mouth 2 (two) times daily between meals. 60 Bottle 0   Fluticasone-Salmeterol (ADVAIR DISKUS) 500-50 MCG/DOSE AEPB Inhale 1 puff into the lungs 2 (two) times daily. 1 each 3   furosemide (LASIX) 40 MG tablet Take 1 tablet (40 mg total) by  mouth daily. 90 tablet 3   guaiFENesin-codeine 100-10 MG/5ML syrup TAKE 5 ML BY MOUTH  THREE TIMES DAILY AS NEEDED FOR COUGH. MAY CAUSE  DROWSINESS. DO NOT DRIVE WITH THIS MEDICATION (Patient not taking: Reported on 12/24/2020) 120 mL 0   Ipratropium-Albuterol (COMBIVENT) 20-100 MCG/ACT AERS respimat Inhale into the lungs.     ipratropium-albuterol (DUONEB) 0.5-2.5 (3) MG/3ML SOLN Take 3 mLs by nebulization every 4 (four) hours as needed. 360 mL 3   methimazole (TAPAZOLE) 10 MG tablet Take 2 tablets by mouth once daily (Patient taking differently: Take 20 mg by mouth daily.) 60 tablet 0   metoprolol succinate (TOPROL-XL) 25 MG 24 hr tablet TAKE ONE AND ONE-HALF TABLETS BY MOUTH ONCE DAILY 45 tablet 0   mirtazapine (REMERON) 15 MG tablet Take 1 tablet (15 mg total) by mouth at bedtime. 30 tablet 4   montelukast (SINGULAIR) 10 MG tablet Take 1 tablet (10 mg total) by mouth at bedtime. 30 tablet 3   pantoprazole (PROTONIX) 40 MG tablet Take 1 tablet (40 mg total) by mouth daily. 90 tablet 1   polyethylene glycol (MIRALAX / GLYCOLAX) packet Take 17 g by mouth daily as needed for mild constipation. 14 each 0   potassium chloride SA (KLOR-CON) 20 MEQ tablet Take 1 tablet (20 mEq total) by mouth 2 (two) times daily. 60 tablet 6   spironolactone (ALDACTONE) 25 MG tablet Take 1 tablet (25 mg total) by mouth daily. 90 tablet 1   No current facility-administered medications for this visit.   Facility-Administered Medications Ordered in Other Visits  Medication Dose Route Frequency Provider Last Rate Last Admin   0.9 %  sodium chloride infusion   Intravenous Continuous Monia Sabal, PA-C       sodium chloride flush (NS) 0.9 % injection 10 mL  10 mL Intracatheter PRN [name], MD   10 mL at 02/23/18 1400    VITAL SIGNS: There were no vitals taken for this visit. There were no vitals filed for this visit.  Estimated body mass index is 26.34 kg/m as calculated from the following:   Height as of 10/24/20: _0  (1.575 m).   Weight as of 12/24/20: 144 lb (65.3 kg).  LABS: CBC:    Component Value Date/Time   WBC 7.3 01/14/2021  1413   HGB 10.9 (L) 01/14/2021 1413   HCT 34.6 (L) 01/14/2021 1413   PLT 189 01/14/2021 1413   MCV 85.9 01/14/2021 1413   NEUTROABS 3.7 01/14/2021 1413   LYMPHSABS 2.7 01/14/2021 1413   MONOABS 0.5 01/14/2021 1413   EOSABS 0.4 01/14/2021 1413   BASOSABS 0.0 01/14/2021 1413   Comprehensive Metabolic Panel:    Component Value Date/Time   NA 140 12/24/2020 1401   NA 141 01/25/2020 1530   K 4.0 12/24/2020 1401   CL 110 12/24/2020 1401   CO2 22 12/24/2020 1401   BUN 11 12/24/2020 1401   BUN 19 01/25/2020 1530   CREATININE 0.54 12/24/2020 1401   GLUCOSE 99 12/24/2020 1401   CALCIUM 9.1 12/24/2020 1401   AST 19 12/24/2020 1401   ALT 14 12/24/2020 1401   ALKPHOS 230 (H) 12/24/2020 1401   BILITOT 0.7 12/24/2020 1401   PROT 7.6 12/24/2020 1401   ALBUMIN 4.0 12/24/2020 1401    RADIOGRAPHIC STUDIES: No results found.  PERFORMANCE STATUS (ECOG) : 0 - Asymptomatic  Review of Systems Unless otherwise noted, a complete review of systems is negative.  Physical Exam General: NAD HEENT: Swollen nasal turbinates, tenderness to palp  frontal sinuses, ears clear bilaterally, no cervical lymphadenopathy, pupils equal round reactive to light Cardiovascular: regular rate and rhythm Pulmonary: clear ant fields Abdomen: soft, nontender, + bowel sounds GU: no suprapubic tenderness Extremities: no edema, no joint deformities Skin: no rashes Neurological: Nonfocal  Assessment and Plan- Patient is a 70 y.o. female with multiple medical problems including hyperthyroidism/goiter on methimazole, COPD, diastolic dysfunction with history of CHF, and stage III multiple myeloma status post stem cell transplant with Revlimid maintenance currently on hold.  Patient [facility] to [facility] today for evaluation of sinus pressure and headaches  Acute sinusitis -start Augmentin 875-125 mg twice daily x7 days.  Discussed with Dr. Rogue Bussing who also recommended starting patient on oral prednisone taper over 1 week.   Patient can continue acetaminophen as needed for headaches.  Continue oral hydration.  Patient to call or follow-up in clinic if symptoms worsening or no improvement.   Case and plan discussed with Dr. Rogue Bussing   Patient expressed understanding and was in agreement with this plan. She also understands that She can call clinic at any time with any questions, concerns, or complaints.   Thank you for allowing me to participate in the care of this very pleasant patient.   Time Total: 15 minutes  Visit consisted of counseling and education dealing with the complex and emotionally intense issues of symptom management and palliative care in the setting of serious and potentially life-threatening illness.Greater than 50%  of this time was spent counseling and coordinating care related to the above assessment and plan.  Signed by: Altha Harm, PhD, NP-C

## 2021-01-15 LAB — THYROID PANEL WITH TSH
Free Thyroxine Index: 0.7 — ABNORMAL LOW (ref 1.2–4.9)
T3 Uptake Ratio: 19 % — ABNORMAL LOW (ref 24–39)
T4, Total: 3.8 ug/dL — ABNORMAL LOW (ref 4.5–12.0)
TSH: <0.005 u[IU]/mL — ABNORMAL LOW (ref 0.450–4.500)

## 2021-01-20 NOTE — Progress Notes (Signed)
Results routed to Dr. Nilda Simmer

## 2021-01-21 ENCOUNTER — Encounter: Payer: Self-pay | Admitting: Internal Medicine

## 2021-01-30 ENCOUNTER — Other Ambulatory Visit: Payer: Self-pay | Admitting: Family

## 2021-01-30 ENCOUNTER — Other Ambulatory Visit: Payer: Self-pay | Admitting: Nurse Practitioner

## 2021-01-30 DIAGNOSIS — C9002 Multiple myeloma in relapse: Secondary | ICD-10-CM

## 2021-02-03 ENCOUNTER — Encounter: Payer: Self-pay | Admitting: Internal Medicine

## 2021-03-19 DIAGNOSIS — K219 Gastro-esophageal reflux disease without esophagitis: Secondary | ICD-10-CM | POA: Insufficient documentation

## 2021-03-19 DIAGNOSIS — E0500 Thyrotoxicosis with diffuse goiter without thyrotoxic crisis or storm: Secondary | ICD-10-CM | POA: Insufficient documentation

## 2021-03-26 ENCOUNTER — Other Ambulatory Visit: Payer: Self-pay | Admitting: *Deleted

## 2021-03-26 DIAGNOSIS — C9002 Multiple myeloma in relapse: Secondary | ICD-10-CM

## 2021-04-01 ENCOUNTER — Encounter: Payer: Self-pay | Admitting: Internal Medicine

## 2021-04-01 ENCOUNTER — Inpatient Hospital Stay: Payer: Medicare Other | Attending: Internal Medicine

## 2021-04-01 ENCOUNTER — Inpatient Hospital Stay (HOSPITAL_BASED_OUTPATIENT_CLINIC_OR_DEPARTMENT_OTHER): Payer: Medicare Other | Admitting: Internal Medicine

## 2021-04-01 DIAGNOSIS — E0500 Thyrotoxicosis with diffuse goiter without thyrotoxic crisis or storm: Secondary | ICD-10-CM | POA: Insufficient documentation

## 2021-04-01 DIAGNOSIS — C9002 Multiple myeloma in relapse: Secondary | ICD-10-CM

## 2021-04-01 DIAGNOSIS — Z79899 Other long term (current) drug therapy: Secondary | ICD-10-CM | POA: Insufficient documentation

## 2021-04-01 DIAGNOSIS — D649 Anemia, unspecified: Secondary | ICD-10-CM | POA: Insufficient documentation

## 2021-04-01 DIAGNOSIS — F17210 Nicotine dependence, cigarettes, uncomplicated: Secondary | ICD-10-CM | POA: Diagnosis not present

## 2021-04-01 DIAGNOSIS — Z9484 Stem cells transplant status: Secondary | ICD-10-CM | POA: Insufficient documentation

## 2021-04-01 DIAGNOSIS — C9000 Multiple myeloma not having achieved remission: Secondary | ICD-10-CM | POA: Diagnosis not present

## 2021-04-01 LAB — COMPREHENSIVE METABOLIC PANEL
ALT: 10 U/L (ref 0–44)
AST: 18 U/L (ref 15–41)
Albumin: 3.8 g/dL (ref 3.5–5.0)
Alkaline Phosphatase: 152 U/L — ABNORMAL HIGH (ref 38–126)
Anion gap: 9 (ref 5–15)
BUN: 9 mg/dL (ref 8–23)
CO2: 25 mmol/L (ref 22–32)
Calcium: 7.7 mg/dL — ABNORMAL LOW (ref 8.9–10.3)
Chloride: 104 mmol/L (ref 98–111)
Creatinine, Ser: 0.68 mg/dL (ref 0.44–1.00)
GFR, Estimated: >60 mL/min (ref >60)
Glucose, Bld: 91 mg/dL (ref 70–99)
Potassium: 3.5 mmol/L (ref 3.5–5.1)
Sodium: 138 mmol/L (ref 135–145)
Total Bilirubin: 0.3 mg/dL (ref 0.3–1.2)
Total Protein: 8 g/dL (ref 6.5–8.1)

## 2021-04-01 LAB — CBC WITH DIFFERENTIAL/PLATELET
Abs Immature Granulocytes: 0.03 10*3/uL (ref 0.00–0.07)
Basophils Absolute: 0 10*3/uL (ref 0.0–0.1)
Basophils Relative: 0 %
Eosinophils Absolute: 0.4 10*3/uL (ref 0.0–0.5)
Eosinophils Relative: 4 %
HCT: 30.6 % — ABNORMAL LOW (ref 36.0–46.0)
Hemoglobin: 9.6 g/dL — ABNORMAL LOW (ref 12.0–15.0)
Immature Granulocytes: 0 %
Lymphocytes Relative: 44 %
Lymphs Abs: 4 10*3/uL (ref 0.7–4.0)
MCH: 26.5 pg (ref 26.0–34.0)
MCHC: 31.4 g/dL (ref 30.0–36.0)
MCV: 84.5 fL (ref 80.0–100.0)
Monocytes Absolute: 0.5 10*3/uL (ref 0.1–1.0)
Monocytes Relative: 5 %
Neutro Abs: 4.1 10*3/uL (ref 1.7–7.7)
Neutrophils Relative %: 47 %
Platelets: 235 10*3/uL (ref 150–400)
RBC: 3.62 MIL/uL — ABNORMAL LOW (ref 3.87–5.11)
RDW: 13.7 % (ref 11.5–15.5)
WBC: 9 10*3/uL (ref 4.0–10.5)
nRBC: 0 % (ref 0.0–0.2)

## 2021-04-01 NOTE — Assessment & Plan Note (Addendum)
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 FEB 2021- [facility]D- NEGATIVE [[facility],Dr.G on 11/22/2019]. PET scan-August 2021-no evidence of any recurrent myeloma. July  2022- Myeloma panel; kappa lambda light chain ratio -negative/normal-   #Continue to hold Revlimid maintenance given patient's multiple medical issues-especially recent pneumonia/upcoming thyroid surgery.  #Mild anemia hemoglobin around 9-likely from her myeloma.  Likely from the recent surgery.  # Hyperthyroidism-[place]/goiter s/p total thyroidectomy [Dr.Kim-[facility]; [place]ia.[facility] ]- STABLE.   # chronic respiratory failure- sec to COPD/diastolic CHF on diuretics nebulizer home O2; continue follow-up with pulmonary; Dr.A & cardiolog-stable  # Bone lesions /last zometa on 11/27/2017.  Holding Zometa secondary to poor dentition; OK to proceed with dental extraction.  Patient states that she will follow-up with dentistry after her repeat evaluation with [facility]/thyroid surgery.  # DISPOSITION:  # Follow up 2  month- MD; labs-cbc//cmp;MM panel; K/L light chains;Dr.B

## 2021-04-01 NOTE — Progress Notes (Signed)
[facility] OFFICE PROGRESS NOTE  Patient Care Team: [facility], [facility] as PCP - General (General Practice) End, Harrell Gave, MD as PCP - Cardiology (Cardiology) [name], [name] [name], MD as Consulting Physician (Hematology and Oncology) Ottie Glazier, MD as Consulting Physician (Pulmonary Disease) Gabriel Carina Betsey Holiday, MD as Consulting Physician (Endocrinology)  Cancer Staging No matching staging information was found for the patient.   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 No morphologic support for residual myeloma disease. Negative for minimal residual disease by MM-MRD flow cytometry; HOLD REVLIMID [leg swelling]; AUG 2021-PET scan negative for myeloma; continue to hold Revlimid  # MARCH 2021-diastolic congestive heart failure [Dr.End]  # BMBx- OCT 2021- BMBx- 5% plasmacytosis-however this appears to be more polyclonal rather than monoclonal-not explain patient worsening anemia.  #November 2021-hyperthyroidism/goiter-  [D.Bennett/Dr.Solum]-methimazole. S/p Thyroidectomy [Dr.Kim; [facility]-AUG 22505] --------------------------  # 12/12- RIGHT JUGULAR DVT-x 343mn xarelto; finished April 2020; September 2020-EGD/dysphagia; Dr. AnVicente Males# Acute renal failure [Dr.Singh; Proteinuria 1.5gm/day ]; acyclovir/Asprin ------------------------------------------------------------------------------------------------------------   DIAGNOSIS: '[ ]'  MULTIPLE MYELOMA  STAGE: III/HIGH RISK ;GOALS: CONTROL  CURRENT/MOST RECENT THERAPY-maintenance Revlimid- on HOLD    IgA myeloma ([place] 11/21/2017 -  Chemotherapy   The patient had dexamethasone (DECADRON) 4 MG tablet, 1 of 1 cycle, Start date: --, End date: -- palonosetron (ALOXI) injection 0.25 mg, 0.25 mg, Intravenous,  Once, 6 of 7 cycles Administration: 0.25 mg (11/21/2017), 0.25 mg (11/29/2017), 0.25 mg (12/05/2017), 0.25 mg (12/21/2017), 0.25 mg (12/28/2017), 0.25 mg (01/04/2018), 0.25 mg (01/18/2018), 0.25 mg (01/25/2018), 0.25 mg (02/01/2018), 0.25 mg (02/15/2018), 0.25 mg (02/22/2018), 0.25 mg (03/01/2018), 0.25 mg (03/15/2018), 0.25 mg (03/22/2018), 0.25 mg (03/30/2018), 0.25 mg (04/13/2018), 0.25 mg (04/20/2018), 0.25 mg (04/27/2018) cyclophosphamide (CYTOXAN) 500 mg in sodium chloride 0.9 % 250 mL chemo infusion, 540 mg, Intravenous,  Once, 6 of 7 cycles Administration: 500 mg (11/21/2017), 500 mg (11/29/2017), 500 mg (12/05/2017), 500 mg (12/28/2017), 500 mg (01/04/2018), 500 mg (01/18/2018), 500 mg (01/25/2018), 500 mg (02/01/2018), 500 mg (02/15/2018), 500 mg (02/22/2018), 500 mg (03/01/2018), 500 mg (03/15/2018), 500 mg (03/30/2018), 500 mg (04/13/2018), 500 mg (04/20/2018), 500 mg (04/27/2018) carfilzomib (KYPROLIS) 36 mg in dextrose 5 % 50 mL chemo infusion, 20 mg/m2 = 36 mg, Intravenous, Once, 6 of 7 cycles Administration: 36 mg (11/21/2017), 36 mg (11/22/2017), 60 mg (11/29/2017), 60 mg (11/30/2017), 60 mg (12/05/2017), 60 mg (12/06/2017), 60 mg (12/21/2017), 60 mg (12/22/2017), 60 mg (12/28/2017), 60 mg (12/29/2017), 60 mg  (01/04/2018), 60 mg (01/05/2018), 60 mg (01/18/2018), 60 mg (01/19/2018), 60 mg (01/25/2018), 60 mg (01/27/2018), 60 mg (02/01/2018), 60 mg (02/02/2018), 60 mg (02/15/2018), 60 mg (02/16/2018), 60 mg (02/22/2018), 60 mg (03/01/2018), 60 mg (03/02/2018), 60 mg (03/15/2018), 60 mg (03/16/2018), 60 mg (  03/23/2018), 60 mg (03/30/2018), 60 mg (03/31/2018), 60 mg (04/13/2018), 60 mg (04/14/2018), 60 mg (04/20/2018), 60 mg (04/21/2018), 60 mg (04/27/2018)   for chemotherapy treatment.      INTERVAL HISTORY:  Brandi [name] 70 y.o.  female pleasant patient above history of relapsed multiple myeloma--most recently not on maintenance Revlimid [on since Feb 2021] is here for follow-up.  In the interim patient underwent total thyroidectomy at [facility].  Pathology reviewed-Graves' disease.  She has done well postsurgery.  She complains of mild pain.  There is no nausea vomiting abdominal pain.  No fevers or chills.  She is gaining weight.   Review of Systems  Constitutional:  Positive for malaise/fatigue. Negative for chills, diaphoresis and fever.  HENT:  Negative for nosebleeds and sore throat.   Eyes:  Negative for double vision.  Respiratory:  Positive for shortness of breath. Negative for hemoptysis.   Cardiovascular:  Negative for chest pain, palpitations and orthopnea.  Gastrointestinal:  Negative for abdominal pain, blood in stool, constipation, heartburn and melena.  Genitourinary:  Negative for dysuria, frequency and urgency.  Musculoskeletal:  Positive for back pain and myalgias.  Skin: Negative.  Negative for itching and rash.  Neurological:  Negative for dizziness, tingling, focal weakness, weakness and headaches.  Endo/Heme/Allergies:  Does not bruise/bleed easily.  Psychiatric/Behavioral:  Negative for depression. The patient has insomnia. The patient is not nervous/anxious.    PAST MEDICAL HISTORY :  Past Medical History:  Diagnosis Date  . Anxiety   . At risk for cardiac dysfunction   . CHF (congestive heart  failure) ([place])   . COPD (chronic obstructive pulmonary disease) ([place])   . Hypertension   . Hyperthyroidism   . Hypokalemia   . IgA myeloma ([place])   . Multiple myeloma ([place])   . Pneumonia   . Red blood cell antibody positive, compatible PRBC difficult to obtain   . S/P autologous bone marrow transplantation ([place])     PAST SURGICAL HISTORY :   Past Surgical History:  Procedure Laterality Date  . ESOPHAGOGASTRODUODENOSCOPY (EGD) WITH PROPOFOL N/A 03/30/2019   Procedure: ESOPHAGOGASTRODUODENOSCOPY (EGD) WITH PROPOFOL;  Surgeon: Jonathon Bellows, MD;  Location: [facility] ENDOSCOPY;  Service: Gastroenterology;  Laterality: N/A;  . IR FLUORO GUIDE PORT INSERTION RIGHT  12/16/2017    FAMILY HISTORY :   Family History  Problem Relation Age of Onset  . Pneumonia Mother   . Seizures Father     SOCIAL HISTORY:   Social History   Tobacco Use  . Smoking status: Every Day    Packs/day: 0.50    Types: Cigarettes  . Smokeless tobacco: Never  . Tobacco comments:    2 cigarettes QOD  Vaping Use  . Vaping Use: Never used  Substance Use Topics  . Alcohol use: No  . Drug use: No    ALLERGIES:  has No Known Allergies.  MEDICATIONS:  Current Outpatient Medications  Medication Sig Dispense Refill  . acyclovir (ZOVIRAX) 400 MG tablet TAKE 1 TABLET BY MOUTH ONCE DAILY TO  PREVENT  SHINGLES 30 tablet 0  . albuterol (VENTOLIN HFA) 108 (90 Base) MCG/ACT inhaler Inhale 2 puffs into the lungs every 6 (six) hours as needed for wheezing or shortness of breath. 8 g 3  . aspirin EC 81 MG EC tablet Take 1 tablet (81 mg total) by mouth daily. 90 tablet 3  . buPROPion (WELLBUTRIN) 75 MG tablet Take 75 mg by mouth daily.    . ergocalciferol (VITAMIN D2) 1.25 MG (50000 UT) capsule Take  1 capsule (50,000 Units total) by mouth once a week. 12 capsule 2  . feeding supplement, ENSURE ENLIVE, (ENSURE ENLIVE) LIQD Take 237 mLs by mouth 2 (two) times daily between meals. 60 Bottle 0  . Fluticasone-Salmeterol (ADVAIR  DISKUS) 500-50 MCG/DOSE AEPB Inhale 1 puff into the lungs 2 (two) times daily. 1 each 3  . Ipratropium-Albuterol (COMBIVENT) 20-100 MCG/ACT AERS respimat Inhale into the lungs.    [name] ipratropium-albuterol (DUONEB) 0.5-2.5 (3) MG/3ML SOLN Take 3 mLs by nebulization every 4 (four) hours as needed. 360 mL 3  . levothyroxine (SYNTHROID) 112 MCG tablet Take 112 mcg by mouth daily.    . metoprolol succinate (TOPROL-XL) 25 MG 24 hr tablet TAKE 1 & 1/2 (ONE & ONE-HALF) TABLETS BY MOUTH ONCE DAILY 45 tablet 5  . mirtazapine (REMERON) 15 MG tablet Take 1 tablet (15 mg total) by mouth at bedtime. 30 tablet 4  . montelukast (SINGULAIR) 10 MG tablet Take 1 tablet (10 mg total) by mouth at bedtime. 30 tablet 3  . pantoprazole (PROTONIX) 40 MG tablet Take 1 tablet (40 mg total) by mouth daily. 90 tablet 1  . polyethylene glycol (MIRALAX / GLYCOLAX) packet Take 17 g by mouth daily as needed for mild constipation. 14 each 0  . potassium chloride SA (KLOR-CON) 20 MEQ tablet Take 1 tablet (20 mEq total) by mouth 2 (two) times daily. 60 tablet 6  . spironolactone (ALDACTONE) 25 MG tablet Take 1 tablet (25 mg total) by mouth daily. 90 tablet 1  . amoxicillin-clavulanate (AUGMENTIN) 875-125 MG tablet Take 1 tablet by mouth 2 (two) times daily. 14 tablet 0  . furosemide (LASIX) 40 MG tablet Take 1 tablet (40 mg total) by mouth daily. (Patient not taking: Reported on 04/01/2021) 90 tablet 3  . guaiFENesin-codeine 100-10 MG/5ML syrup TAKE 5 ML BY MOUTH  THREE TIMES DAILY AS NEEDED FOR COUGH. MAY CAUSE DROWSINESS. DO NOT DRIVE WITH THIS MEDICATION (Patient not taking: Reported on 04/01/2021) 120 mL 0  . HYDROcodone-acetaminophen (NORCO/VICODIN) 5-325 MG tablet Take by mouth. (Patient not taking: Reported on 04/01/2021)    . methimazole (TAPAZOLE) 10 MG tablet Take 2 tablets by mouth once daily (Patient not taking: Reported on 04/01/2021) 60 tablet 0   No current facility-administered medications for this visit.    Facility-Administered Medications Ordered in Other Visits  Medication Dose Route Frequency Provider Last Rate Last Admin  . 0.9 %  sodium chloride infusion   Intravenous Continuous Monia Sabal, PA-C      . sodium chloride flush (NS) 0.9 % injection 10 mL  10 mL Intracatheter PRN [name], MD   10 mL at 02/23/18 1400    PHYSICAL EXAMINATION: ECOG PERFORMANCE STATUS: 1 - Symptomatic but completely ambulatory  BP (!) 141/79 (BP Location: Right Arm, Patient Position: Sitting)   Pulse (!) 55   Temp 98.7 F (37.1 C)   Resp 16   Wt 155 lb (70.3 kg)   SpO2 98%   BMI 28.35 kg/m   Filed Weights   04/01/21 1456  Weight: 155 lb (70.3 kg)    Physical Exam Constitutional:      Comments: patient in a wheelchair.  She is accompanied by family..  She is on 2 L nasal cannula.  HENT:     Head: Normocephalic and atraumatic.  Eyes:     Pupils: Pupils are equal, round, and reactive to light.  Neck:     Comments: Goiter noted. Cardiovascular:     Rate and Rhythm: Normal rate and  regular rhythm.  Pulmonary:     Effort: Pulmonary effort is normal. No respiratory distress.     Breath sounds: Normal breath sounds. No wheezing.  Abdominal:     General: Bowel sounds are normal. There is no distension.     Palpations: Abdomen is soft. There is no mass.     Tenderness: no abdominal tenderness There is no guarding or rebound.  Musculoskeletal:        General: No tenderness. Normal range of motion.     Cervical back: Normal range of motion and neck supple.  Skin:    General: Skin is warm.  Neurological:     Mental Status: She is alert and oriented to person, place, and time.  Psychiatric:        Mood and Affect: Affect normal.   LABORATORY DATA:  I have reviewed the data as listed    Component Value Date/Time   NA 138 04/01/2021 1434   NA 141 01/25/2020 1530   K 3.5 04/01/2021 1434   CL 104 04/01/2021 1434   CO2 25 04/01/2021 1434   GLUCOSE 91 04/01/2021 1434   BUN 9  04/01/2021 1434   BUN 19 01/25/2020 1530   CREATININE 0.68 04/01/2021 1434   CALCIUM 7.7 (L) 04/01/2021 1434   PROT 8.0 04/01/2021 1434   ALBUMIN 3.8 04/01/2021 1434   AST 18 04/01/2021 1434   ALT 10 04/01/2021 1434   ALKPHOS 152 (H) 04/01/2021 1434   BILITOT 0.3 04/01/2021 1434   GFRNONAA >60 04/01/2021 1434   GFRAA NOT CALCULATED 04/17/2020 1352    No results found for: SPEP, UPEP  Lab Results  Component Value Date   WBC 9.0 04/01/2021   NEUTROABS 4.1 04/01/2021   HGB 9.6 (L) 04/01/2021   HCT 30.6 (L) 04/01/2021   MCV 84.5 04/01/2021   PLT 235 04/01/2021      Chemistry      Component Value Date/Time   NA 138 04/01/2021 1434   NA 141 01/25/2020 1530   K 3.5 04/01/2021 1434   CL 104 04/01/2021 1434   CO2 25 04/01/2021 1434   BUN 9 04/01/2021 1434   BUN 19 01/25/2020 1530   CREATININE 0.68 04/01/2021 1434      Component Value Date/Time   CALCIUM 7.7 (L) 04/01/2021 1434   ALKPHOS 152 (H) 04/01/2021 1434   AST 18 04/01/2021 1434   ALT 10 04/01/2021 1434   BILITOT 0.3 04/01/2021 1434       RADIOGRAPHIC STUDIES: I have personally reviewed the radiological images as listed and agreed with the findings in the report. No results found.   ASSESSMENT & PLAN:  IgA myeloma ([place]) #Multiple myeloma stage III high-risk cytogenetics-status post KRD-VGPR ; status post autologous stem cell transplant on 06/15/18. FEB 2021- [facility]-MRD- NEGATIVE [[facility],Dr.G on 11/22/2019]. PET scan-August 2021-no evidence of any recurrent myeloma. July  2022- Myeloma panel; kappa lambda light chain ratio -negative/normal-   #Continue to hold Revlimid maintenance given patient's multiple medical issues-especially recent pneumonia/upcoming thyroid surgery.  #Mild anemia hemoglobin around 9-likely from her myeloma.  Likely from the recent surgery.  # Hyperthyroidism-Graves/goiter s/p total thyroidectomy [Dr.Kim-[facility]; [place].[facility] ]- STABLE.   # chronic respiratory failure- sec to COPD/diastolic CHF on  diuretics nebulizer home O2; continue follow-up with pulmonary; Dr.A & cardiolog-stable  # Bone lesions /last zometa on 11/27/2017.  Holding Zometa secondary to poor dentition; OK to proceed with dental extraction.  Patient states that she will follow-up with dentistry after her repeat evaluation with [facility]/thyroid surgery.  #  DISPOSITION:  # Follow up 2  month- MD; labs-cbc//cmp;MM panel; K/L light chains;Dr.B   No orders of the defined types were placed in this encounter.  All questions were answered. The patient knows to call the clinic with any problems, questions or concerns.      [name], MD 04/01/2021 5:59 PM

## 2021-04-01 NOTE — Progress Notes (Signed)
Pt in for follow up, denies any difficulties or concerns today. 

## 2021-04-02 LAB — KAPPA/LAMBDA LIGHT CHAINS
Kappa free light chain: 12.4 mg/L (ref 3.3–19.4)
Kappa, lambda light chain ratio: 0.08 — ABNORMAL LOW (ref 0.26–1.65)
Lambda free light chains: 156.5 mg/L — ABNORMAL HIGH (ref 5.7–26.3)

## 2021-04-06 LAB — MULTIPLE MYELOMA PANEL, SERUM
Albumin SerPl Elph-Mcnc: 3.4 g/dL (ref 2.9–4.4)
Albumin/Glob SerPl: 1.1 (ref 0.7–1.7)
Alpha 1: 0.2 g/dL (ref 0.0–0.4)
Alpha2 Glob SerPl Elph-Mcnc: 0.7 g/dL (ref 0.4–1.0)
B-Globulin SerPl Elph-Mcnc: 1.3 g/dL (ref 0.7–1.3)
Gamma Glob SerPl Elph-Mcnc: 1.2 g/dL (ref 0.4–1.8)
Globulin, Total: 3.4 g/dL (ref 2.2–3.9)
IgA: 643 mg/dL — ABNORMAL HIGH (ref 87–352)
IgG (Immunoglobin G), Serum: 934 mg/dL (ref 586–1602)
IgM (Immunoglobulin M), Srm: 32 mg/dL (ref 26–217)
M Protein SerPl Elph-Mcnc: 0.6 g/dL — ABNORMAL HIGH
Total Protein ELP: 6.8 g/dL (ref 6.0–8.5)

## 2021-04-18 ENCOUNTER — Encounter: Payer: Self-pay | Admitting: Internal Medicine

## 2021-04-18 NOTE — Progress Notes (Signed)
I spoke to patient's daughter and also to the patient regarding my concerns for recurrent myeloma. However we will repeat the bloodwork in approximate two weeks from now and reassess. They are in agreement.

## 2021-04-28 ENCOUNTER — Inpatient Hospital Stay (HOSPITAL_BASED_OUTPATIENT_CLINIC_OR_DEPARTMENT_OTHER): Payer: Medicare Other | Admitting: Internal Medicine

## 2021-04-28 ENCOUNTER — Inpatient Hospital Stay: Payer: Medicare Other | Attending: Internal Medicine

## 2021-04-28 ENCOUNTER — Encounter: Payer: Self-pay | Admitting: Internal Medicine

## 2021-04-28 ENCOUNTER — Telehealth: Payer: Self-pay | Admitting: Pharmacy Technician

## 2021-04-28 ENCOUNTER — Other Ambulatory Visit: Payer: Self-pay

## 2021-04-28 VITALS — BP 144/75 | HR 44 | Temp 98.0°F | Resp 20 | Ht 62.0 in | Wt 154.5 lb

## 2021-04-28 DIAGNOSIS — Z79899 Other long term (current) drug therapy: Secondary | ICD-10-CM | POA: Insufficient documentation

## 2021-04-28 DIAGNOSIS — C9002 Multiple myeloma in relapse: Secondary | ICD-10-CM | POA: Insufficient documentation

## 2021-04-28 DIAGNOSIS — F17210 Nicotine dependence, cigarettes, uncomplicated: Secondary | ICD-10-CM | POA: Diagnosis not present

## 2021-04-28 DIAGNOSIS — E876 Hypokalemia: Secondary | ICD-10-CM

## 2021-04-28 DIAGNOSIS — Z5112 Encounter for antineoplastic immunotherapy: Secondary | ICD-10-CM | POA: Insufficient documentation

## 2021-04-28 DIAGNOSIS — E871 Hypo-osmolality and hyponatremia: Secondary | ICD-10-CM | POA: Insufficient documentation

## 2021-04-28 DIAGNOSIS — E0500 Thyrotoxicosis with diffuse goiter without thyrotoxic crisis or storm: Secondary | ICD-10-CM | POA: Diagnosis not present

## 2021-04-28 DIAGNOSIS — N179 Acute kidney failure, unspecified: Secondary | ICD-10-CM | POA: Insufficient documentation

## 2021-04-28 DIAGNOSIS — C9000 Multiple myeloma not having achieved remission: Secondary | ICD-10-CM

## 2021-04-28 DIAGNOSIS — Z9484 Stem cells transplant status: Secondary | ICD-10-CM | POA: Insufficient documentation

## 2021-04-28 LAB — CBC WITH DIFFERENTIAL/PLATELET
Abs Immature Granulocytes: 0.02 10*3/uL (ref 0.00–0.07)
Basophils Absolute: 0.1 10*3/uL (ref 0.0–0.1)
Basophils Relative: 1 %
Eosinophils Absolute: 0.3 10*3/uL (ref 0.0–0.5)
Eosinophils Relative: 4 %
HCT: 35.3 % — ABNORMAL LOW (ref 36.0–46.0)
Hemoglobin: 10.9 g/dL — ABNORMAL LOW (ref 12.0–15.0)
Immature Granulocytes: 0 %
Lymphocytes Relative: 42 %
Lymphs Abs: 3.3 10*3/uL (ref 0.7–4.0)
MCH: 26.7 pg (ref 26.0–34.0)
MCHC: 30.9 g/dL (ref 30.0–36.0)
MCV: 86.5 fL (ref 80.0–100.0)
Monocytes Absolute: 0.3 10*3/uL (ref 0.1–1.0)
Monocytes Relative: 4 %
Neutro Abs: 3.7 10*3/uL (ref 1.7–7.7)
Neutrophils Relative %: 49 %
Platelets: 190 10*3/uL (ref 150–400)
RBC: 4.08 MIL/uL (ref 3.87–5.11)
RDW: 15.5 % (ref 11.5–15.5)
WBC: 7.7 10*3/uL (ref 4.0–10.5)
nRBC: 0 % (ref 0.0–0.2)

## 2021-04-28 LAB — COMPREHENSIVE METABOLIC PANEL
ALT: 10 U/L (ref 0–44)
AST: 19 U/L (ref 15–41)
Albumin: 4 g/dL (ref 3.5–5.0)
Alkaline Phosphatase: 142 U/L — ABNORMAL HIGH (ref 38–126)
Anion gap: 8 (ref 5–15)
BUN: 14 mg/dL (ref 8–23)
CO2: 26 mmol/L (ref 22–32)
Calcium: 8.8 mg/dL — ABNORMAL LOW (ref 8.9–10.3)
Chloride: 104 mmol/L (ref 98–111)
Creatinine, Ser: 0.9 mg/dL (ref 0.44–1.00)
GFR, Estimated: >60 mL/min (ref >60)
Glucose, Bld: 109 mg/dL — ABNORMAL HIGH (ref 70–99)
Potassium: 3.9 mmol/L (ref 3.5–5.1)
Sodium: 138 mmol/L (ref 135–145)
Total Bilirubin: 0.6 mg/dL (ref 0.3–1.2)
Total Protein: 8.5 g/dL — ABNORMAL HIGH (ref 6.5–8.1)

## 2021-04-28 MED ORDER — MONTELUKAST SODIUM 10 MG PO TABS: 10.0000 mg | ORAL_TABLET | Freq: Every day | ORAL | 0 refills | Status: DC

## 2021-04-28 MED ORDER — PROCHLORPERAZINE MALEATE 10 MG PO TABS: 10.0000 mg | ORAL_TABLET | Freq: Four times a day (QID) | ORAL | 1 refills | Status: DC | PRN

## 2021-04-28 MED ORDER — DEXAMETHASONE 4 MG PO TABS: ORAL_TABLET | ORAL | 3 refills | Status: DC

## 2021-04-28 MED ORDER — ALBUTEROL SULFATE HFA 108 (90 BASE) MCG/ACT IN AERS: 2.0000 | INHALATION_SPRAY | Freq: Four times a day (QID) | RESPIRATORY_TRACT | 3 refills | Status: DC | PRN

## 2021-04-28 MED ORDER — POTASSIUM CHLORIDE CRYS ER 20 MEQ PO TBCR: 20.0000 meq | EXTENDED_RELEASE_TABLET | Freq: Two times a day (BID) | ORAL | 6 refills | Status: DC

## 2021-04-28 MED ORDER — ACYCLOVIR 400 MG PO TABS
400.0000 mg | ORAL_TABLET | Freq: Two times a day (BID) | ORAL | 3 refills | Status: DC
Start: 2021-04-28 — End: 2022-12-13

## 2021-04-28 MED ORDER — LENALIDOMIDE 15 MG PO CAPS: 15.0000 mg | ORAL_CAPSULE | Freq: Every day | ORAL | 0 refills | Status: DC

## 2021-04-28 MED ORDER — ONDANSETRON HCL 8 MG PO TABS: ORAL_TABLET | ORAL | 1 refills | Status: DC

## 2021-04-28 NOTE — Telephone Encounter (Signed)
Oral Oncology Patient Advocate Encounter  After submitting a request for Revlimid PA questions through [facility], I received a response that [employer] previously approved Revlimid from 07/26/20-07/25/21.  Brandi [name] Phone [phone] Fax [phone] 04/28/2021 11:58 AM

## 2021-04-28 NOTE — Patient Instructions (Signed)
#   Take pre-medications as recommended prior to next treatment/chemo infusion. Start 2 days prior to treatment x 2 days. DO NOT TAKE ON THE DAY OF TREATMENT    # Take tylenol 325 mg twice a day; [over-the-counter] # Take pepcid/famotidine 20 mg twice a day [over the counter]   # Singular 1 pill once a day [prescription] # Dexamethasone 4 mg twice daily. [Prescription]

## 2021-04-28 NOTE — Progress Notes (Signed)
Patient reinstated/reenrolled with [employer]. New script sent to [facility]. [employer] Auth # [id number]   Date Obtained  04/28/21

## 2021-04-28 NOTE — Progress Notes (Signed)
[place] OFFICE PROGRESS NOTE  Patient Care Team: [facility], [facility] as PCP - General (General Practice) End, Harrell Gave, MD as PCP - Cardiology (Cardiology) [name], [name] [name], MD as Consulting Physician (Hematology and Oncology) Ottie Glazier, MD as Consulting Physician (Pulmonary Disease) Gabriel Carina Betsey Holiday, MD as Consulting Physician (Endocrinology)  Cancer Staging No matching staging information was found for the patient.   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 No morphologic support for residual myeloma disease. Negative for minimal residual disease by MM-MRD flow cytometry; HOLD REVLIMID [leg swelling]; AUG 2021-PET scan negative for myeloma; continue to hold Revlimid  # MARCH 2021-diastolic congestive heart failure [Dr.End]  # BMBx- OCT 2021- BMBx- 5% plasmacytosis-however this appears to be more polyclonal rather than monoclonal-not explain patient worsening anemia.  #November 2021-hyperthyroidism/goiter-  [D.Bennett/Dr.Solum]-methimazole. S/p Thyroidectomy [Dr.Kim; [facility] 6945]  --------------------------  # 12/12- RIGHT JUGULAR DVT-x 52m on xarelto; finished April 2020; September 2020-EGD/dysphagia; Dr. Vicente Males  # Acute renal failure [Dr.Singh; Proteinuria 1.5gm/day ]; acyclovir/Asprin ------------------------------------------------------------------------------------------------------------   DIAGNOSIS: $RemoveBef'[ ]'RodwcdZfrn$  MULTIPLE MYELOMA  STAGE: III/HIGH RISK ;GOALS: CONTROL  CURRENT/MOST RECENT THERAPY-maintenance Revlimid- on HOLD    IgA myeloma ([place])  11/21/2017 - 04/28/2018 Chemotherapy   Patient is on Treatment Plan : MYELOMA SALVAGE Cyclophosphamide / Carfilzomib / Dexamethasone (CCd) q28d     04/28/2021 -  Chemotherapy   Patient is on Treatment Plan : MYELOMA RELAPSED REFRACTORY Daratumumab SQ + Lenalidomide + Dexamethasone (DaraRd) q28d      INTERVAL HISTORY: Ambulating independently.  Accompanied by her daughter.  Brandi [name] 70 y.o.  female pleasant patient above history of relapsed multiple myeloma--most recently not on maintenance Revlimid [on since Feb 2021] is here for follow-up/review the results of blood work concerning for recurrent myeloma.  Patient is recovering well from total thyroidectomy.  Appetite is good.  No weight loss.  No nausea no vomiting.  Review of Systems  Constitutional:  Positive for malaise/fatigue. Negative for chills, diaphoresis and fever.  HENT:  Negative for nosebleeds and sore throat.   Eyes:  Negative for double vision.  Respiratory:  Positive for shortness of breath. Negative for hemoptysis.   Cardiovascular:  Negative for chest pain, palpitations and orthopnea.  Gastrointestinal:  Negative for abdominal pain, blood in stool, constipation, heartburn and melena.  Genitourinary:  Negative for dysuria, frequency and urgency.  Musculoskeletal:  Positive for back pain and myalgias.  Skin: Negative.  Negative for itching and rash.  Neurological:   Negative for dizziness, tingling, focal weakness, weakness and headaches.  Endo/Heme/Allergies:  Does not bruise/bleed easily.  Psychiatric/Behavioral:  Negative for depression. The patient has insomnia. The patient is not nervous/anxious.    PAST MEDICAL HISTORY :  Past Medical History:  Diagnosis Date  Anxiety    At risk for cardiac dysfunction    CHF (congestive heart failure) (HCC)    COPD (chronic obstructive pulmonary disease) (HCC)    Hypertension    Hyperthyroidism    Hypokalemia    IgA myeloma (HCC)    Multiple myeloma (HCC)    Pneumonia    Red blood cell antibody positive, compatible PRBC difficult to obtain    S/P autologous bone marrow transplantation ([place])     PAST SURGICAL HISTORY :   Past Surgical History:  Procedure Laterality Date   ESOPHAGOGASTRODUODENOSCOPY (EGD) WITH PROPOFOL N/A 03/30/2019   Procedure: ESOPHAGOGASTRODUODENOSCOPY (EGD) WITH PROPOFOL;  Surgeon: Jonathon Bellows, MD;  Location: [place];  Service: Gastroenterology;  Laterality: N/A;   IR FLUORO GUIDE PORT INSERTION RIGHT  12/16/2017    FAMILY HISTORY :   Family History  Problem Relation Age of Onset   Pneumonia Mother    Seizures Father     SOCIAL HISTORY:   Social History   Tobacco Use   Smoking status: Every Day    Packs/day: 0.50    Types: Cigarettes   Smokeless tobacco: Never   Tobacco comments:    2 cigarettes QOD  Vaping Use   Vaping Use: Never used  Substance Use Topics   Alcohol use: No   Drug use: No    ALLERGIES:  has No Known Allergies.  MEDICATIONS:  Current Outpatient Medications  Medication Sig Dispense Refill   aspirin EC 81 MG EC tablet Take 1 tablet (81 mg total) by mouth daily. 90 tablet 3   buPROPion (WELLBUTRIN) 75 MG tablet Take 75 mg by mouth daily.     dexamethasone (DECADRON) 4 MG tablet Start 2 days prior to infusion; Take for 2 days. Do not take on the day of infusion. 60 tablet 3   ergocalciferol (VITAMIN D2) 1.25 MG (50000 UT) capsule Take 1  capsule (50,000 Units total) by mouth once a week. 12 capsule 2   lenalidomide (REVLIMID) 15 MG capsule Take 1 capsule (15 mg total) by mouth daily. 3 weeks on and 1 week off 21 capsule 0   levothyroxine (SYNTHROID) 112 MCG tablet Take 112 mcg by mouth daily.     metoprolol succinate (TOPROL-XL) 25 MG 24 hr tablet TAKE 1 & 1/2 (ONE & ONE-HALF) TABLETS BY MOUTH ONCE DAILY 45 tablet 5   mirtazapine (REMERON) 15 MG tablet Take 1 tablet (15 mg total) by mouth at bedtime. 30 tablet 4   montelukast (SINGULAIR) 10 MG tablet Take 1 tablet (10 mg total) by mouth at bedtime. START 2 days prior to chemo infusion. Take For 2 days; Do NOT take on the day of infusion. 60 tablet 0   ondansetron (ZOFRAN) 8 MG tablet One pill every 8 hours as needed for nausea/vomitting. 40 tablet 1   pantoprazole (PROTONIX) 40 MG tablet Take 1 tablet (40 mg total) by mouth daily. 90 tablet 1   polyethylene glycol (MIRALAX / GLYCOLAX) packet Take 17 g by mouth daily as needed for mild constipation. 14 each 0   prochlorperazine (COMPAZINE) 10 MG tablet Take 1 tablet (10 mg total) by mouth every 6 (six) hours as needed for nausea or vomiting. 40 tablet 1   spironolactone (ALDACTONE) 25 MG tablet Take 1 tablet (25 mg total) by mouth daily. 90 tablet 1   acyclovir (ZOVIRAX) 400 MG tablet Take 1 tablet (400 mg total) by mouth 2 (two) times daily. 60 tablet 3   albuterol (VENTOLIN HFA) 108 (90 Base) MCG/ACT inhaler Inhale 2  puffs into the lungs every 6 (six) hours as needed for wheezing or shortness of breath. 8 g 3   feeding supplement, ENSURE ENLIVE, (ENSURE ENLIVE) LIQD Take 237 mLs by mouth 2 (two) times daily between meals. (Patient not taking: Reported on 04/28/2021) 60 Bottle 0   Fluticasone-Salmeterol (ADVAIR DISKUS) 500-50 MCG/DOSE AEPB Inhale 1 puff into the lungs 2 (two) times daily. (Patient not taking: Reported on 04/28/2021) 1 each 3   furosemide (LASIX) 40 MG tablet Take 1 tablet (40 mg total) by mouth daily. (Patient not  taking: No sig reported) 90 tablet 3   Ipratropium-Albuterol (COMBIVENT) 20-100 MCG/ACT AERS respimat Inhale into the lungs. (Patient not taking: Reported on 04/28/2021)     ipratropium-albuterol (DUONEB) 0.5-2.5 (3) MG/3ML SOLN Take 3 mLs by nebulization every 4 (four) hours as needed. (Patient not taking: Reported on 04/28/2021) 360 mL 3   potassium chloride SA (KLOR-CON) 20 MEQ tablet Take 1 tablet (20 mEq total) by mouth 2 (two) times daily. 60 tablet 6   No current facility-administered medications for this visit.   Facility-Administered Medications Ordered in Other Visits  Medication Dose Route Frequency Provider Last Rate Last Admin   0.9 %  sodium chloride infusion   Intravenous Continuous Monia Sabal, PA-C        PHYSICAL EXAMINATION: ECOG PERFORMANCE STATUS: 1 - Symptomatic but completely ambulatory  BP (!) 144/75   Pulse (!) 44   Temp 98 F (36.7 C) (Tympanic)   Resp 20   Ht $R'5\' 2"'rc$  (1.575 m)   Wt 154 lb 8 oz (70.1 kg)   BMI 28.26 kg/m   Filed Weights   04/28/21 0940  Weight: 154 lb 8 oz (70.1 kg)    Physical Exam Constitutional:      Comments: patient in a wheelchair.  She is accompanied by family..  She is on 2 L nasal cannula.  HENT:     Head: Normocephalic and atraumatic.  Eyes:     Pupils: Pupils are equal, round, and reactive to light.  Neck:     Comments: Goiter noted. Cardiovascular:     Rate and Rhythm: Normal rate and regular rhythm.  Pulmonary:     Effort: Pulmonary effort is normal. No respiratory distress.     Breath sounds: Normal breath sounds. No wheezing.  Abdominal:     General: Bowel sounds are normal. There is no distension.     Palpations: Abdomen is soft. There is no mass.     Tenderness: There is no abdominal tenderness. There is no guarding or rebound.  Musculoskeletal:        General: No tenderness. Normal range of motion.     Cervical back: Normal range of motion and neck supple.  Skin:    General: Skin is warm.  Neurological:      Mental Status: She is alert and oriented to person, place, and time.  Psychiatric:        Mood and Affect: Affect normal.   LABORATORY DATA:  I have reviewed the data as listed    Component Value Date/Time   NA 138 04/28/2021 0917   NA 141 01/25/2020 1530   K 3.9 04/28/2021 0917   CL 104 04/28/2021 0917   CO2 26 04/28/2021 0917   GLUCOSE 109 (H) 04/28/2021 0917   BUN 14 04/28/2021 0917   BUN 19 01/25/2020 1530   CREATININE 0.90 04/28/2021 0917   CALCIUM 8.8 (L) 04/28/2021 0917   PROT 8.5 (H) 04/28/2021 0917   ALBUMIN 4.0 04/28/2021 9407  AST 19 04/28/2021 0917   ALT 10 04/28/2021 0917   ALKPHOS 142 (H) 04/28/2021 0917   BILITOT 0.6 04/28/2021 0917   GFRNONAA >60 04/28/2021 0917   GFRAA NOT CALCULATED 04/17/2020 1352    No results found for: SPEP, UPEP  Lab Results  Component Value Date   WBC 7.7 04/28/2021   NEUTROABS 3.7 04/28/2021   HGB 10.9 (L) 04/28/2021   HCT 35.3 (L) 04/28/2021   MCV 86.5 04/28/2021   PLT 190 04/28/2021      Chemistry      Component Value Date/Time   NA 138 04/28/2021 0917   NA 141 01/25/2020 1530   K 3.9 04/28/2021 0917   CL 104 04/28/2021 0917   CO2 26 04/28/2021 0917   BUN 14 04/28/2021 0917   BUN 19 01/25/2020 1530   CREATININE 0.90 04/28/2021 0917      Component Value Date/Time   CALCIUM 8.8 (L) 04/28/2021 0917   ALKPHOS 142 (H) 04/28/2021 0917   AST 19 04/28/2021 0917   ALT 10 04/28/2021 0917   BILITOT 0.6 04/28/2021 0917     Results for CHYNAH, ORIHUELA (MRN [id number]) as of 04/28/2021 16:46  Ref. Range 10/24/2020 13:16 12/24/2020 14:01 01/14/2021 14:13 04/01/2021 14:34 04/28/2021 09:17  Kappa free light chain Latest Ref Range: 3.3 - 19.4 mg/L 23.1 (H) 16.8  12.4   Lambda free light chains Latest Ref Range: 5.7 - 26.3 mg/L 36.6 (H) 41.1 (H)  156.5 (H)   Kappa, lambda light chain ratio Latest Ref Range: 0.26 - 1.65  0.63 0.41  0.08 (L)      RADIOGRAPHIC STUDIES: I have personally reviewed the radiological images as  listed and agreed with the findings in the report. No results found.   ASSESSMENT & PLAN:  IgA myeloma ([place]) #Multiple myeloma stage III high-risk cytogenetics-status post KRD-VGPR ; status post autologous stem cell transplant on 06/15/18. FEB 2021- [facility]- NEGATIVE [[facility],Dr.G on 11/22/2019]. PET scan-August 2021-no evidence of any recurrent myeloma. However-September 2022 M protein-0.6 g/dL.  Kappa/lambda light chain ratio 0.08.  #I reviewed the above findings with the patient and daughter in detail.  Discussed my concerns for recurrent myeloma.  Await labs from today.  Hemoglobin is 10.5.  No renal dysfunction noted.  #Recommend proceeding with salvage therapy with daratumumab; lenalidomide dexamethasone.  I reviewed the potential side effects including but not limited to infusion reactions/diarrhea risk of infections etc. with daratumumab.  Also restart lenalidomide 15 mg-3 weeks on 1 week off along with dexamethasone.  Called in prescription for dexamethasone/Singulair/also antiemetics.  #Patient daughter understand that this is unfortunately not curable; but should have good control of the disease.  # Hyperthyroidism-Graves/goiter s/p total thyroidectomy [Dr.Kim-[facility]; [place].Hodgkin ]- STABLE.   #Chronic diastolic CHF-clinically stable at this time.  # Bone lesions /last zometa on 11/27/2017.  Holding Zometa secondary to poor dentition.  #  COVID EVUSHELD PROPHYLAXIS: Given the immunosuppressive effects of treatments I would recommend EVUSHELD prophylaxis.  IM injection x2 [same time]-cutdown risk of Covid infection/next 6 months. Patient is vaccinated to [place].  Will refer to Beckey Rutter, NP-who will speak to the patient.  Also start the patient on shingles prophylaxis.   # DISPOSITION:  # [place] Signs # Follow up 2  Weeks-- MD; labs-cbc//cmp;Dara SQDr.B  # 40 minutes face-to-face with the patient discussing the above plan of care; more than 50% of time spent on prognosis/ natural  history; counseling and coordination.    Orders Placed This Encounter  Procedures   CBC with Differential  Standing Status:   Future    Standing Expiration Date:   04/28/2022   Comprehensive metabolic panel    Standing Status:   Future    Standing Expiration Date:   04/28/2022   Pretreatment RBC phenotype    Standing Status:   Future    Standing Expiration Date:   04/28/2022    Order Specific Question:   Medication to be given:    Answer:   Daratumumab   Pretreatment RBC phenotype    Obtain prior to daratumumab treatment.    Standing Status:   Future    Standing Expiration Date:   04/28/2022    Order Specific Question:   Medication to be given:    Answer:   Daratumumab    All questions were answered. The patient knows to call the clinic with any problems, questions or concerns.      [name], MD 04/28/2021 4:51 PM

## 2021-04-28 NOTE — Assessment & Plan Note (Addendum)
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 FEB 2021- [facility]D- NEGATIVE [[facility],Dr.G on 11/22/2019]. PET scan-August 2021-no evidence of any recurrent myeloma. However-September 2022 M protein-0.6 g/dL.  Kappa/lambda light chain ratio 0.08.  #I reviewed the above findings with the patient and daughter in detail.  Discussed my concerns for recurrent myeloma.  Await labs from today.  Hemoglobin is 10.5.  No renal dysfunction noted.  #Recommend proceeding with salvage therapy with daratumumab; lenalidomide dexamethasone.  I reviewed the potential side effects including but not limited to infusion reactions/diarrhea risk of infections etc. with daratumumab.  Also restart lenalidomide 15 mg-3 weeks on 1 week off along with dexamethasone.  Called in prescription for dexamethasone/Singulair/also antiemetics.  #Patient daughter understand that this is unfortunately not curable; but should have good control of the disease.  # Hyperthyroidism-Graves/goiter s/p total thyroidectomy [Dr.Kim-[facility]; Emylia.Hodgkin ]- STABLE.   #Chronic diastolic CHF-clinically stable at this time.  # Bone lesions /last zometa on 11/27/2017.  Holding Zometa secondary to poor dentition.  #  COVID EVUSHELD PROPHYLAXIS: Given the immunosuppressive effects of treatments I would recommend EVUSHELD prophylaxis.  IM injection x2 [same time]-cutdown risk of Covid infection/next 6 months. Patient is vaccinated to [place].  Will refer to Beckey Rutter, NP-who will speak to the patient.  Also start the patient on shingles prophylaxis.   # DISPOSITION:  # [place] Signs # Follow up 2  Weeks-- MD; labs-cbc//cmp;Dara SQDr.B  # 40 minutes face-to-face with the patient discussing the above plan of care; more than 50% of time spent on prognosis/ natural history; counseling and coordination.

## 2021-04-29 LAB — KAPPA/LAMBDA LIGHT CHAINS
Kappa free light chain: 11.7 mg/L (ref 3.3–19.4)
Kappa, lambda light chain ratio: 0.06 — ABNORMAL LOW (ref 0.26–1.65)
Lambda free light chains: 191.3 mg/L — ABNORMAL HIGH (ref 5.7–26.3)

## 2021-05-01 LAB — MULTIPLE MYELOMA PANEL, SERUM
Albumin SerPl Elph-Mcnc: 3.8 g/dL (ref 2.9–4.4)
Albumin/Glob SerPl: 1 (ref 0.7–1.7)
Alpha 1: 0.1 g/dL (ref 0.0–0.4)
Alpha2 Glob SerPl Elph-Mcnc: 0.6 g/dL (ref 0.4–1.0)
B-Globulin SerPl Elph-Mcnc: 1.6 g/dL — ABNORMAL HIGH (ref 0.7–1.3)
Gamma Glob SerPl Elph-Mcnc: 1.6 g/dL (ref 0.4–1.8)
Globulin, Total: 3.9 g/dL (ref 2.2–3.9)
IgA: 1102 mg/dL — ABNORMAL HIGH (ref 87–352)
IgG (Immunoglobin G), Serum: 881 mg/dL (ref 586–1602)
IgM (Immunoglobulin M), Srm: 31 mg/dL (ref 26–217)
M Protein SerPl Elph-Mcnc: 0.7 g/dL — ABNORMAL HIGH
Total Protein ELP: 7.7 g/dL (ref 6.0–8.5)

## 2021-05-04 ENCOUNTER — Telehealth: Payer: Self-pay | Admitting: *Deleted

## 2021-05-04 ENCOUNTER — Telehealth: Payer: Self-pay | Admitting: Pharmacist

## 2021-05-04 NOTE — Telephone Encounter (Signed)
Dr. B - please advise. 

## 2021-05-04 NOTE — Telephone Encounter (Addendum)
I spoke with patient. She needs to wait until dr. B sees her on 10/18 to start on the med.

## 2021-05-04 NOTE — Telephone Encounter (Signed)
Called stating that her Revlimid came in today and she is asking when she is to start taking it. Please advise Her next appointment is 05/12/21 for New start Dara

## 2021-05-05 NOTE — Telephone Encounter (Addendum)
Oral Oncology Pharmacist Encounter  Received new prescription for lenalidomide and dexamethasone for the treatment of recurrent multiple myeloma in conjunction with daratumumab, planned duration until disease progression or unacceptable toxicities occur.  Labs from 04/28/2021 assessed, no labs need to be addressed at this time. Prescription dose and frequency assessed. Patient will be started on a lower  Current medication list in Epic reviewed, two DDIs with dexamethasone identified: - Aspirin may enhance adverse effects of dexamethasone (category C) - will monitor - Furosemide may enhance hypokalemia (category C) - will monitor   Evaluated chart and no patient barriers to medication adherence identified.   Prescription has been e-scribed to the [facility] for benefits analysis and approval.  Oral Oncology Clinic will continue to follow for insurance authorization, copayment issues, initial counseling and start date.  Patient agreed to treatment on 04/28/2021 per MD documentation.  Brianna Beldon, PharmD PGY1 Acute Care Pharmacy Resident  05/05/2021  11:34 AM 

## 2021-05-07 ENCOUNTER — Encounter: Payer: Self-pay | Admitting: Internal Medicine

## 2021-05-07 NOTE — Progress Notes (Signed)
Telephone conversation with patient based upon referral from Clinical Team  Discussed one-time $1000 [place] and qualifications to assist with personal expenses while undergoing treatment.  Will mail patient [place] agreement and information on expenses. Provided patient my contact information for any additional questions.

## 2021-05-10 ENCOUNTER — Other Ambulatory Visit: Payer: Self-pay | Admitting: Family

## 2021-05-12 ENCOUNTER — Inpatient Hospital Stay (HOSPITAL_BASED_OUTPATIENT_CLINIC_OR_DEPARTMENT_OTHER): Payer: Medicare Other | Admitting: Internal Medicine

## 2021-05-12 ENCOUNTER — Inpatient Hospital Stay: Payer: Medicare Other

## 2021-05-12 ENCOUNTER — Inpatient Hospital Stay: Payer: Medicare Other | Admitting: Pharmacist

## 2021-05-12 ENCOUNTER — Other Ambulatory Visit: Payer: Self-pay

## 2021-05-12 VITALS — BP 148/75 | HR 48 | Temp 98.4°F | Resp 18

## 2021-05-12 VITALS — BP 157/81 | HR 45 | Temp 98.1°F | Resp 20 | Ht 62.0 in | Wt 159.6 lb

## 2021-05-12 DIAGNOSIS — C9000 Multiple myeloma not having achieved remission: Secondary | ICD-10-CM | POA: Diagnosis not present

## 2021-05-12 DIAGNOSIS — E876 Hypokalemia: Secondary | ICD-10-CM

## 2021-05-12 DIAGNOSIS — D649 Anemia, unspecified: Secondary | ICD-10-CM | POA: Diagnosis not present

## 2021-05-12 DIAGNOSIS — C9002 Multiple myeloma in relapse: Secondary | ICD-10-CM

## 2021-05-12 DIAGNOSIS — Z5112 Encounter for antineoplastic immunotherapy: Secondary | ICD-10-CM | POA: Diagnosis not present

## 2021-05-12 LAB — CBC WITH DIFFERENTIAL/PLATELET
Abs Immature Granulocytes: 0.06 10*3/uL (ref 0.00–0.07)
Basophils Absolute: 0 10*3/uL (ref 0.0–0.1)
Basophils Relative: 0 %
Eosinophils Absolute: 0 10*3/uL (ref 0.0–0.5)
Eosinophils Relative: 0 %
HCT: 34.5 % — ABNORMAL LOW (ref 36.0–46.0)
Hemoglobin: 10.7 g/dL — ABNORMAL LOW (ref 12.0–15.0)
Immature Granulocytes: 1 %
Lymphocytes Relative: 18 %
Lymphs Abs: 2.1 10*3/uL (ref 0.7–4.0)
MCH: 27.1 pg (ref 26.0–34.0)
MCHC: 31 g/dL (ref 30.0–36.0)
MCV: 87.3 fL (ref 80.0–100.0)
Monocytes Absolute: 0.2 10*3/uL (ref 0.1–1.0)
Monocytes Relative: 2 %
Neutro Abs: 9.3 10*3/uL — ABNORMAL HIGH (ref 1.7–7.7)
Neutrophils Relative %: 79 %
Platelets: 215 10*3/uL (ref 150–400)
RBC: 3.95 MIL/uL (ref 3.87–5.11)
RDW: 16 % — ABNORMAL HIGH (ref 11.5–15.5)
WBC: 11.6 10*3/uL — ABNORMAL HIGH (ref 4.0–10.5)
nRBC: 0 % (ref 0.0–0.2)

## 2021-05-12 LAB — COMPREHENSIVE METABOLIC PANEL
ALT: 11 U/L (ref 0–44)
AST: 17 U/L (ref 15–41)
Albumin: 4.1 g/dL (ref 3.5–5.0)
Alkaline Phosphatase: 125 U/L (ref 38–126)
Anion gap: 6 (ref 5–15)
BUN: 21 mg/dL (ref 8–23)
CO2: 24 mmol/L (ref 22–32)
Calcium: 8.4 mg/dL — ABNORMAL LOW (ref 8.9–10.3)
Chloride: 108 mmol/L (ref 98–111)
Creatinine, Ser: 0.92 mg/dL (ref 0.44–1.00)
GFR, Estimated: >60 mL/min (ref >60)
Glucose, Bld: 118 mg/dL — ABNORMAL HIGH (ref 70–99)
Potassium: 4.2 mmol/L (ref 3.5–5.1)
Sodium: 138 mmol/L (ref 135–145)
Total Bilirubin: 0.2 mg/dL — ABNORMAL LOW (ref 0.3–1.2)
Total Protein: 9.1 g/dL — ABNORMAL HIGH (ref 6.5–8.1)

## 2021-05-12 MED ORDER — DARATUMUMAB-HYALURONIDASE-FIHJ 1800-30000 MG-UT/15ML ~~LOC~~ SOLN
1800.0000 mg | Freq: Once | SUBCUTANEOUS | Status: AC
  Administered 2021-05-12: 1800 mg via SUBCUTANEOUS
  Filled 2021-05-12: qty 15

## 2021-05-12 MED ORDER — DIPHENHYDRAMINE HCL 25 MG PO CAPS
50.0000 mg | ORAL_CAPSULE | Freq: Once | ORAL | Status: AC
  Administered 2021-05-12: 50 mg via ORAL
  Filled 2021-05-12: qty 2

## 2021-05-12 MED ORDER — MONTELUKAST SODIUM 10 MG PO TABS
10.0000 mg | ORAL_TABLET | Freq: Once | ORAL | Status: AC
  Administered 2021-05-12: 10 mg via ORAL
  Filled 2021-05-12: qty 1

## 2021-05-12 MED ORDER — ACETAMINOPHEN 325 MG PO TABS
650.0000 mg | ORAL_TABLET | Freq: Once | ORAL | Status: AC
  Administered 2021-05-12: 650 mg via ORAL
  Filled 2021-05-12: qty 2

## 2021-05-12 MED ORDER — DEXAMETHASONE 4 MG PO TABS
20.0000 mg | ORAL_TABLET | Freq: Once | ORAL | Status: AC
  Administered 2021-05-12: 20 mg via ORAL
  Filled 2021-05-12: qty 5

## 2021-05-12 NOTE — Patient Instructions (Signed)
CANCER CENTER [facility]  Discharge Instructions: Thank you for choosing [facility] to provide your oncology and hematology care.  If you have a lab appointment with the Cancer Center, please go directly to the Cancer Center and check in at the registration area.  Wear comfortable clothing and clothing appropriate for easy access to any Portacath or PICC line.   We strive to give you quality time with your provider. You may need to reschedule your appointment if you arrive late (15 or more minutes).  Arriving late affects you and other patients whose appointments are after yours.  Also, if you miss three or more appointments without notifying the office, you may be dismissed from the clinic at the provider's discretion.      For prescription refill requests, have your pharmacy contact our office and allow 72 hours for refills to be completed.      To help prevent nausea and vomiting after your treatment, we encourage you to take your nausea medication as directed.  BELOW ARE SYMPTOMS THAT SHOULD BE REPORTED IMMEDIATELY: *FEVER GREATER THAN 100.4 F (38 C) OR HIGHER *CHILLS OR SWEATING *NAUSEA AND VOMITING THAT IS NOT CONTROLLED WITH YOUR NAUSEA MEDICATION *UNUSUAL SHORTNESS OF BREATH *UNUSUAL BRUISING OR BLEEDING *URINARY PROBLEMS (pain or burning when urinating, or frequent urination) *BOWEL PROBLEMS (unusual diarrhea, constipation, pain near the anus) TENDERNESS IN MOUTH AND THROAT WITH OR WITHOUT PRESENCE OF ULCERS (sore throat, sores in mouth, or a toothache) UNUSUAL RASH, SWELLING OR PAIN  UNUSUAL VAGINAL DISCHARGE OR ITCHING   Items with * indicate a potential emergency and should be followed up as soon as possible or go to the Emergency Department if any problems should occur.  Please show the CHEMOTHERAPY ALERT CARD or IMMUNOTHERAPY ALERT CARD at check-in to the Emergency Department and triage nurse.  Should you have questions after your  visit or need to cancel or reschedule your appointment, please contact CANCER CENTER  [facility]  [phone] and follow the prompts.  Office hours are 8:00 a.m. to 4:30 p.m. Monday - Friday. Please note that voicemails left after 4:00 p.m. may not be returned until the following business day.  We are closed weekends and major holidays. You have access to a nurse at all times for urgent questions. Please call the main number to the clinic [phone] and follow the prompts.  For any non-urgent questions, you may also contact your provider using MyChart. We now offer e-Visits for anyone 18 and older to request care online for non-urgent symptoms. For details visit [email or web address].   Also download the MyChart app! Go to the app store, search "MyChart", open the app, select [facility], and log in with your MyChart username and password.  Due to Covid, a mask is required upon entering the hospital/clinic. If you do not have a mask, one will be given to you upon arrival. For doctor visits, patients may have 1 support person aged 18 or older with them. For treatment visits, patients cannot have anyone with them due to current Covid guidelines and our immunocompromised population.  

## 2021-05-12 NOTE — Progress Notes (Signed)
[facility] OFFICE PROGRESS NOTE  Patient Care Team: [facility], [place] as PCP - General (General Practice) End, Harrell Gave, MD as PCP - Cardiology (Cardiology) [name], [name] [name], MD as Consulting Physician (Hematology and Oncology) Ottie Glazier, MD as Consulting Physician (Pulmonary Disease) Gabriel Carina Betsey Holiday, MD as Consulting Physician (Endocrinology)  Cancer Staging No matching staging information was found for the patient.   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 No morphologic support for residual myeloma disease. Negative for minimal residual disease by MM-MRD flow cytometry; HOLD REVLIMID [leg swelling]; AUG 2021-PET scan negative for myeloma; continue to hold Revlimid  # MARCH 2021-diastolic congestive heart failure [Dr.End]  # BMBx- OCT 2021- BMBx- 5% plasmacytosis-however this appears to be more polyclonal rather than monoclonal-not explain patient worsening anemia.   # OCT 2022- 18th- Dara- Rev-Dex  #November  2021-hyperthyroidism/goiter- [D.Bennett/Dr.Solum]-methimazole. S/p Thyroidectomy [Dr.Kim; [facility] 23299] --------------------------  # 12/12- RIGHT JUGULAR DVT-x 345mn xarelto; finished April 2020; September 2020-EGD/dysphagia; Dr. AnVicente Males# Acute renal failure [Dr.Singh; Proteinuria 1.5gm/day ]; acyclovir/Asprin ------------------------------------------------------------------------------------------------------------   DIAGNOSIS: '[ ]'  MULTIPLE MYELOMA  STAGE: III/HIGH RISK ;GOALS: CONTROL  CURRENT/MOST RECENT THERAPY-maintenance Revlimid- on HOLD    [place]21[place] - 04/28/2018 Chemotherapy   Patient is on Treatment Plan : MYELOMA SALVAGE Cyclophosphamide / Carfilzomib / Dexamethasone (CCd) q28d     05/12/2021 -  Chemotherapy   Patient is on Treatment Plan : MYELOMA RELAPSED REFRACTORY Daratumumab SQ + Lenalidomide + Dexamethasone (DaraRd) q28d      INTERVAL HISTORY: Ambulating independently.  Accompanied by her daughter.  Brandi [id number].  female pleasant patient above history of relapsed multiple myeloma--is here to proceed with daratumumab-Revlimid-dexamethasone.  Denies any worsening cough or shortness of breath.  Appetite is good.  No weight loss.  No nausea no vomiting no diarrhea.  Review of Systems  Constitutional:  Positive for malaise/fatigue. Negative for chills, diaphoresis and fever.  HENT:  Negative for nosebleeds and sore throat.   Eyes:  Negative for double vision.  Respiratory:  Negative for hemoptysis.   Cardiovascular:  Negative for chest pain, palpitations and orthopnea.  Gastrointestinal:  Negative for abdominal pain, blood in stool, constipation, heartburn and melena.  Genitourinary:  Negative for dysuria, frequency and urgency.  Musculoskeletal:  Positive for back pain.  Skin: Negative.  Negative for itching and rash.  Neurological:  Negative for dizziness, tingling, focal weakness, weakness and headaches.  Endo/Heme/Allergies:   Does not bruise/bleed easily.  Psychiatric/Behavioral:  Negative for depression. The patient is not nervous/anxious.    PAST MEDICAL HISTORY :  Past Medical History:  Diagnosis Date  . Anxiety   . At risk for cardiac dysfunction   . CHF (congestive heart  failure) ([place])   . COPD (chronic obstructive pulmonary disease) ([place])   . Hypertension   . Hyperthyroidism   . Hypokalemia   . IgA myeloma ([place])   . Multiple myeloma ([place])   . Pneumonia   . Red blood cell antibody positive, compatible PRBC difficult to obtain   . S/P autologous bone marrow transplantation ([place])     PAST SURGICAL HISTORY :   Past Surgical History:  Procedure Laterality Date  . ESOPHAGOGASTRODUODENOSCOPY (EGD) WITH PROPOFOL N/A 03/30/2019   Procedure: ESOPHAGOGASTRODUODENOSCOPY (EGD) WITH PROPOFOL;  Surgeon: Jonathon Bellows, MD;  [facility] END[facility];  Service: Gastroenterology;  Laterality: N/A;  . IR FLUORO GUIDE PORT INSERTION RIGHT  12/16/2017    FAMILY HISTORY :   Family History  Problem Relation Age of Onset  . Pneumonia Mother   . Seizures Father     SOCIAL HISTORY:   Social History   Tobacco Use  . Smoking status: Every Day    Packs/day: 0.50    Types: Cigarettes  . Smokeless tobacco: Never  . Tobacco comments:    2 cigarettes QOD  Vaping Use  . Vaping Use: Never used  Substance Use Topics  . Alcohol use: No  . Drug use: No    ALLERGIES:  has No Known Allergies.  MEDICATIONS:  Current Outpatient Medications  Medication Sig Dispense Refill  . acyclovir (ZOVIRAX) 400 MG tablet Take 1 tablet (400 mg total) by mouth 2 (two) times daily. 60 tablet 3  . albuterol (VENTOLIN HFA) 108 (90 Base) MCG/ACT inhaler Inhale 2 puffs into the lungs every 6 (six) hours as needed for wheezing or shortness of breath. 8 g 3  . aspirin EC 81 MG EC tablet Take 1 tablet (81 mg total) by mouth daily. 90 tablet 3  . buPROPion (WELLBUTRIN) 75 MG tablet Take 75 mg by mouth daily.    [name] dexamethasone (DECADRON) 4 MG  tablet Start 2 days prior to infusion; Take for 2 days. Do not take on the day of infusion. (Patient taking differently: Take 4 mg by mouth 2 (two) times daily. Start 2 days prior to infusion; Take for 2 days. Do not take on the day of infusion.) 60 tablet 3  . ergocalciferol (VITAMIN D2) 1.25 MG (50000 UT) capsule Take 1 capsule (50,000 Units total) by mouth once a week. 12 capsule 2  . feeding supplement, ENSURE ENLIVE, (ENSURE ENLIVE) LIQD Take 237 mLs by mouth 2 (two) times daily between meals. 60 Bottle 0  . Fluticasone-Salmeterol (ADVAIR DISKUS) 500-50 MCG/DOSE AEPB Inhale 1 puff into the lungs 2 (two) times daily. 1 each 3  . furosemide (LASIX) 40 MG tablet Take 1 tablet (40 mg total) by mouth daily. 90 tablet 3  . ipratropium-albuterol (DUONEB) 0.5-2.5 (3) MG/3ML SOLN Take 3 mLs by nebulization every 4 (four) hours as needed. 360 mL 3  . levothyroxine (SYNTHROID) 112 MCG tablet Take 112 mcg by mouth daily.    . metoprolol succinate (TOPROL-XL) 25 MG 24 hr tablet TAKE 1 & 1/2 (ONE & ONE-HALF) TABLETS BY MOUTH ONCE DAILY 45 tablet 5  . mirtazapine (REMERON) 15 MG tablet Take 1 tablet (15 mg total) by mouth at bedtime. 30 tablet 4  . montelukast (SINGULAIR) 10 MG tablet Take 1 tablet (10 mg total) by mouth at bedtime. START 2 days prior to chemo infusion. Take For 2 days; Do NOT take on the day of infusion. 60 tablet 0  . ondansetron (ZOFRAN) 8 MG tablet One pill every 8 hours as needed  for nausea/vomitting. 40 tablet 1  . pantoprazole (PROTONIX) 40 MG tablet Take 1 tablet (40 mg total) by mouth daily. 90 tablet 1  . polyethylene glycol (MIRALAX / GLYCOLAX) packet Take 17 g by mouth daily as needed for mild constipation. 14 each 0  . potassium chloride SA (KLOR-CON) 20 MEQ tablet Take 1 tablet (20 mEq total) by mouth 2 (two) times daily. 60 tablet 6  . prochlorperazine (COMPAZINE) 10 MG tablet Take 1 tablet (10 mg total) by mouth every 6 (six) hours as needed for nausea or vomiting. 40 tablet 1   . spironolactone (ALDACTONE) 25 MG tablet Take 1 tablet by mouth once daily 90 tablet 0  . Ipratropium-Albuterol (COMBIVENT) 20-100 MCG/ACT AERS respimat Inhale into the lungs. (Patient not taking: Reported on 04/28/2021)    . lenalidomide (REVLIMID) 15 MG capsule Take 1 capsule (15 mg total) by mouth daily. 3 weeks on and 1 week off (Patient not taking: Reported on 05/12/2021) 21 capsule 0   No current facility-administered medications for this visit.   Facility-Administered Medications Ordered in Other Visits  Medication Dose Route Frequency Provider Last Rate Last Admin  . 0.9 %  sodium chloride infusion   Intravenous Continuous Monia Sabal, PA-C      . daratumumab-hyaluronidase-fihj (DARZALEX FASPRO) 1800-30000 MG-UT/15ML chemo SQ injection 1,800 mg  1,800 mg Subcutaneous Once [name], MD        PHYSICAL EXAMINATION: ECOG PERFORMANCE STATUS: 1 - Symptomatic but completely ambulatory  BP (!) 157/81   Pulse (!) 45   Temp 98.1 F (36.7 C) (Tympanic)   Resp 20   Ht '5\' 2"'  (1.575 m)   Wt 159 lb 9.6 oz (72.4 kg)   BMI 29.19 kg/m   Filed Weights   05/12/21 0900  Weight: 159 lb 9.6 oz (72.4 kg)    Physical Exam HENT:     Head: Normocephalic and atraumatic.  Eyes:     Pupils: Pupils are equal, round, and reactive to light.  Cardiovascular:     Rate and Rhythm: Normal rate and regular rhythm.  Pulmonary:     Effort: Pulmonary effort is normal. No respiratory distress.     Breath sounds: Normal breath sounds. No wheezing.  Abdominal:     General: Bowel sounds are normal. There is no distension.     Palpations: Abdomen is soft. There is no mass.     Tenderness: There is no abdominal tenderness. There is no guarding or rebound.  Musculoskeletal:        General: No tenderness. Normal range of motion.     Cervical back: Normal range of motion and neck supple.  Skin:    General: Skin is warm.  Neurological:     Mental Status: She is alert and oriented to person,  place, and time.  Psychiatric:        Mood and Affect: Affect normal.   LABORATORY DATA:  I have reviewed the data as listed    Component Value Date/Time   NA 138 05/12/2021 0844   NA 141 01/25/2020 1530   K 4.2 05/12/2021 0844   CL 108 05/12/2021 0844   CO2 24 05/12/2021 0844   GLUCOSE 118 (H) 05/12/2021 0844   BUN 21 05/12/2021 0844   BUN 19 01/25/2020 1530   CREATININE 0.92 05/12/2021 0844   CALCIUM 8.4 (L) 05/12/2021 0844   PROT 9.1 (H) 05/12/2021 0844   ALBUMIN 4.1 05/12/2021 0844   AST 17 05/12/2021 0844   ALT 11 05/12/2021 0844  ALKPHOS 125 05/12/2021 0844   BILITOT 0.2 (L) 05/12/2021 0844   GFRNONAA >60 05/12/2021 0844   GFRAA NOT CALCULATED 04/17/2020 1352    No results found for: SPEP, UPEP  Lab Results  Component Value Date   WBC 11.6 (H) 05/12/2021   NEUTROABS 9.3 (H) 05/12/2021   HGB 10.7 (L) 05/12/2021   HCT 34.5 (L) 05/12/2021   MCV 87.3 05/12/2021   PLT 215 05/12/2021      Chemistry      Component Value Date/Time   NA 138 05/12/2021 0844   NA 141 01/25/2020 1530   K 4.2 05/12/2021 0844   CL 108 05/12/2021 0844   CO2 24 05/12/2021 0844   BUN 21 05/12/2021 0844   BUN 19 01/25/2020 1530   CREATININE 0.92 05/12/2021 0844      Component Value Date/Time   CALCIUM 8.4 (L) 05/12/2021 0844   ALKPHOS 125 05/12/2021 0844   AST 17 05/12/2021 0844   ALT 11 05/12/2021 0844   BILITOT 0.2 (L) 05/12/2021 0844     Results for TRINETTE, VERA (MRN [id number]) as of 04/28/2021 16:46  Ref. Range 10/24/2020 13:16 12/24/2020 14:01 01/14/2021 14:13 04/01/2021 14:34 04/28/2021 09:17  Kappa free light chain Latest Ref Range: 3.3 - 19.4 mg/L 23.1 (H) 16.8  12.4   Lambda free light chains Latest Ref Range: 5.7 - 26.3 mg/L 36.6 (H) 41.1 (H)  156.5 (H)   Kappa, lambda light chain ratio Latest Ref Range: 0.26 - 1.65  0.63 0.41  0.08 (L)      RADIOGRAPHIC STUDIES: I have personally reviewed the radiological images as listed and agreed with the findings in the  report. No results found.   ASSESSMENT & PLAN:  IgA myeloma ([place]) # RECURRENT Multiple myeloma stage III [high-risk cytogenetics-status post KRD-VGPR ; status post autologous stem cell transplant on 06/15/18. -OCT 2022 M protein-0.7 g/dL.  Kappa/lambda light chain ratio 0.08.  #I reviewed the above findings with the patient and daughter in detail.  Discussed my concerns for recurrent myeloma.  A  Hemoglobin is 10.5.  No renal dysfunction noted.  # proceed with salvage therapy with daratumumab; lenalidomide [15 mg 3 w; 1-w] dexamethasone.  Patient s/p premedication.  Again reviewed with patient daughter understand that this is unfortunately not curable; but should have good control of the disease/long-term remissions.  # Hyperthyroidism-Graves/goiter s/p total thyroidectomy [Dr.Kim-[facility]; aug,2022 ]-stable  #Chronic diastolic CHF-clinically stable at this time.  # Bone lesions /last zometa on 11/27/2017.  Holding Zometa secondary to poor dentition.  #  COVID EVUSHELD PROPHYLAXIS: Given the immunosuppressive effects of treatments I would recommend EVUSHELD prophylaxis.  IM injection x2 [same time]-cutdown risk of Covid infection/next 6 months. Patient is vaccinated to [place].  Will refer to Beckey Rutter, NP-who will speak to the patient.  Also on shingles prophylaxis.  # DISPOSITION:  # Dara SQ today # 1 week; labs- cbc.cmp; Dara SQ # Follow up 2  Weeks-- MD; labs-cbc//cmp;Dara SQ-Dr.B      Orders Placed This Encounter  Procedures  . CBC with Differential    Standing Status:   Future    Standing Expiration Date:   05/12/2022  . Comprehensive metabolic panel    Standing Status:   Future    Standing Expiration Date:   05/12/2022  . CBC with Differential    Standing Status:   Future    Standing Expiration Date:   05/12/2022  . Comprehensive metabolic panel    Standing Status:   Future    Standing  Expiration Date:   05/12/2022    All questions were answered. The patient knows to  call the clinic with any problems, questions or concerns.      [name], MD 05/12/2021 11:14 AM

## 2021-05-12 NOTE — Assessment & Plan Note (Addendum)
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 -OCT 2022 M protein-0.7 g/dL.  Kappa/lambda light chain ratio 0.08.  #I reviewed the above findings with the patient and daughter in detail.  Discussed my concerns for recurrent myeloma.  A  Hemoglobin is 10.5.  No renal dysfunction noted.  # proceed with salvage therapy with daratumumab; lenalidomide [15 mg 3 w; 1-w] dexamethasone.  Patient s/p premedication.  Again reviewed with patient daughter understand that this is unfortunately not curable; but should have good control of the disease/long-term remissions.  # Hyperthyroidism-Graves/goiter s/p total thyroidectomy [Dr.Kim-[facility]; aug,2022 ]-stable  #Chronic diastolic CHF-clinically stable at this time.  # Bone lesions /last zometa on 11/27/2017.  Holding Zometa secondary to poor dentition.  #  COVID EVUSHELD PROPHYLAXIS: Given the immunosuppressive effects of treatments I would recommend EVUSHELD prophylaxis.  IM injection x2 [same time]-cutdown risk of Covid infection/next 6 months. Patient is vaccinated to [place].  Will refer to Beckey Rutter, NP-who will speak to the patient.  Also on shingles prophylaxis.  # DISPOSITION:  # Dara SQ today # 1 week; labs- cbc.cmp; Dara SQ # Follow up 2  Weeks-- MD; labs-cbc//cmp;Dara SQ-Dr.B

## 2021-05-12 NOTE — Progress Notes (Signed)
[place]  [phone] [phone]  Patient Care Team: [facility] [place] as PCP - General (General Practice) End, Harrell Gave, MD as PCP - Cardiology (Cardiology) [name], [name] [name], MD as Consulting Physician (Hematology and Oncology) Ottie Glazier, MD as Consulting Physician (Pulmonary Disease) Gabriel Carina Betsey Holiday, MD as Consulting Physician (Endocrinology)   Name of the patient: Brandi [name]  [id number]  14-Apr-1951   Date of visit: 05/12/21  HPI: Patient is a 70 y.o. female with recurrent multiple myeloma. Planned treatment with Revlimid (lenalidomide), daratumumab, and dexamethasone. She will start treatment today 05/12/21.  Reason for Consult: Lenalidomide oral chemotherapy education.   PAST MEDICAL HISTORY: Past Medical History:  Diagnosis Date   Anxiety    At risk for cardiac dysfunction    CHF (congestive heart failure) (HCC)    COPD (chronic obstructive pulmonary disease) (HCC)    Hypertension    Hyperthyroidism    Hypokalemia    IgA myeloma (HCC)    Multiple myeloma (HCC)    Pneumonia    Red blood cell antibody positive, compatible PRBC difficult to obtain    S/P autologous bone marrow transplantation [facility])     HEMATOLOGY/ONCOLOGY HISTORY:  Oncology History Overview Note  # SEP 2018- MULTIPLE MYELOMA IgALamda [2.5 gm/dl; K/L= 88/1298]; STAGE III [beta 2 microglobulin=5.5] [presented with acute renal failure; anemia; NO hypercalcemia; Skeletal survey-Normal]; BMBx- 45% plasma cells; FISH-POSITIVE 11:14 translocation.[STANDARD-high RISK]/cyto-Normal; SEP 2018- PET- L3 posterior element lesion.   # 9/14- velcade SQ twice weekly/Dex 40 mg/week; OCT 5th 2018-Start R [32m]VD; 3cycles of RVD- PARTAL RESPONSE  # Jan 11th 2019-Dara-Rev-Dex; April 2019- BMBx- plasma cell -by CD-138/IHC-80% [baseline Sep 2018- 85% ];  HOLD transplant [dw Dr.Gasperatto]  # April 29th 2019 2019- carfil-Cyt-Dex; AUG 6th BMBx- 6% plasma cells; VGPR  # Autologous stem cell transplant on 06/15/18 [[facility]/ Dr.Gasperrato]  # may 1st week-2019- Maintenance Revlimid 10 mg 3w/1w;   FEB 10th 2021- [[facility]]Cellular marrow (50%) with normal trilineage hematopoiesis. No morphologic support for residual myeloma disease. Negative for minimal residual disease by MM-MRD flow cytometry; HOLD REVLIMID [leg swelling]; AUG 2021-PET scan negative for myeloma; continue to hold Revlimid  # MARCH 2021-diastolic congestive heart failure [Dr.End]  # BMBx- OCT 2021- BMBx- 5% plasmacytosis-however this appears to be more polyclonal rather than monoclonal-not explain patient worsening anemia.   # OCT 2022- 18th- Dara- Rev-Dex  #November 2021-hyperthyroidism/goiter- [D.Bennett/Dr.Solum]-methimazole. S/p Thyroidectomy [Dr.Kim; [id number]] --------------------------  # 12/12- RIGHT JUGULAR DVT-x 371mn xarelto; finished April 2020; September 2020-EGD/dysphagia; Dr. AnVicente Males# Acute renal failure [Dr.Singh; Proteinuria 1.5gm/day ]; acyclovir/Asprin ------------------------------------------------------------------------------------------------------------   DIAGNOSIS: '[ ]'  MULTIPLE MYELOMA  STAGE: III/HIGH RISK ;GOALS: CONTROL  CURRENT/MOST RECENT THERAPY-maintenance Revlimid- on HOLD    [facility]21[facility] - 04/28/2018 Chemotherapy   Patient is on Treatment Plan : MYELOMA SALVAGE Cyclophosphamide / Carfilzomib / Dexamethasone (CCd) q28d     05/12/2021 -  Chemotherapy   Patient is on Treatment Plan : MYELOMA RELAPSED REFRACTORY Daratumumab SQ + Lenalidomide + Dexamethasone (DaraRd) q28d       ALLERGIES:  has No Known Allergies.  MEDICATIONS:  Current Outpatient Medications  Medication Sig Dispense Refill   acyclovir (ZOVIRAX) 400 MG tablet Take 1 tablet (400 mg total) by mouth 2 (two) times daily. 60 tablet 3   albuterol (VENTOLIN  HFA) 108 (90 Base) MCG/ACT inhaler Inhale 2 puffs into the lungs every 6 (six) hours as needed for wheezing  or shortness of breath. 8 g 3   aspirin EC 81 MG EC tablet Take 1 tablet (81 mg total) by mouth daily. 90 tablet 3   buPROPion (WELLBUTRIN) 75 MG tablet Take 75 mg by mouth daily.     dexamethasone (DECADRON) 4 MG tablet Start 2 days prior to infusion; Take for 2 days. Do not take on the day of infusion. (Patient taking differently: Take 4 mg by mouth 2 (two) times daily. Start 2 days prior to infusion; Take for 2 days. Do not take on the day of infusion.) 60 tablet 3   ergocalciferol (VITAMIN D2) 1.25 MG (50000 UT) capsule Take 1 capsule (50,000 Units total) by mouth once a week. 12 capsule 2   feeding supplement, ENSURE ENLIVE, (ENSURE ENLIVE) LIQD Take 237 mLs by mouth 2 (two) times daily between meals. 60 Bottle 0   Fluticasone-Salmeterol (ADVAIR DISKUS) 500-50 MCG/DOSE AEPB Inhale 1 puff into the lungs 2 (two) times daily. 1 each 3   furosemide (LASIX) 40 MG tablet Take 1 tablet (40 mg total) by mouth daily. 90 tablet 3   Ipratropium-Albuterol (COMBIVENT) 20-100 MCG/ACT AERS respimat Inhale into the lungs. (Patient not taking: Reported on 04/28/2021)     ipratropium-albuterol (DUONEB) 0.5-2.5 (3) MG/3ML SOLN Take 3 mLs by nebulization every 4 (four) hours as needed. 360 mL 3   lenalidomide (REVLIMID) 15 MG capsule Take 1 capsule (15 mg total) by mouth daily. 3 weeks on and 1 week off (Patient not taking: Reported on 05/12/2021) 21 capsule 0   levothyroxine (SYNTHROID) 112 MCG tablet Take 112 mcg by mouth daily.     metoprolol succinate (TOPROL-XL) 25 MG 24 hr tablet TAKE 1 & 1/2 (ONE & ONE-HALF) TABLETS BY MOUTH ONCE DAILY 45 tablet 5   mirtazapine (REMERON) 15 MG tablet Take 1 tablet (15 mg total) by mouth at bedtime. 30 tablet 4   montelukast (SINGULAIR) 10 MG tablet Take 1 tablet (10 mg total) by mouth at bedtime. START 2 days prior to chemo infusion. Take For 2 days; Do NOT take on the  day of infusion. 60 tablet 0   ondansetron (ZOFRAN) 8 MG tablet One pill every 8 hours as needed for nausea/vomitting. 40 tablet 1   pantoprazole (PROTONIX) 40 MG tablet Take 1 tablet (40 mg total) by mouth daily. 90 tablet 1   polyethylene glycol (MIRALAX / GLYCOLAX) packet Take 17 g by mouth daily as needed for mild constipation. 14 each 0   potassium chloride SA (KLOR-CON) 20 MEQ tablet Take 1 tablet (20 mEq total) by mouth 2 (two) times daily. 60 tablet 6   prochlorperazine (COMPAZINE) 10 MG tablet Take 1 tablet (10 mg total) by mouth every 6 (six) hours as needed for nausea or vomiting. 40 tablet 1   spironolactone (ALDACTONE) 25 MG tablet Take 1 tablet by mouth once daily 90 tablet 0   No current facility-administered medications for this visit.   Facility-Administered Medications Ordered in Other Visits  Medication Dose Route Frequency Provider Last Rate Last Admin   0.9 %  sodium chloride infusion   Intravenous Continuous Monia Sabal, PA-C        VITAL SIGNS: There were no vitals taken for this visit. There were no vitals filed for this visit.  Estimated body mass index is 29.19 kg/m as calculated from the following:   Height as of an earlier encounter on 05/12/21: '5\' 2"'  (1.575 m).   Weight as of an earlier encounter on 05/12/21: 72.4 kg (159 lb 9.6 oz).  LABS: CBC:    Component Value Date/Time   WBC 11.6 (H) 05/12/2021 0844   HGB 10.7 (L) 05/12/2021 0844   HCT 34.5 (L) 05/12/2021 0844   PLT 215 05/12/2021 0844   MCV 87.3 05/12/2021 0844   NEUTROABS 9.3 (H) 05/12/2021 0844   LYMPHSABS 2.1 05/12/2021 0844   MONOABS 0.2 05/12/2021 0844   EOSABS 0.0 05/12/2021 0844   BASOSABS 0.0 05/12/2021 0844   Comprehensive Metabolic Panel:    Component Value Date/Time   NA 138 05/12/2021 0844   NA 141 01/25/2020 1530   K 4.2 05/12/2021 0844   CL 108 05/12/2021 0844   CO2 24 05/12/2021 0844   BUN 21 05/12/2021 0844   BUN 19 01/25/2020 1530   CREATININE 0.92 05/12/2021 0844    GLUCOSE 118 (H) 05/12/2021 0844   CALCIUM 8.4 (L) 05/12/2021 0844   AST 17 05/12/2021 0844   ALT 11 05/12/2021 0844   ALKPHOS 125 05/12/2021 0844   BILITOT 0.2 (L) 05/12/2021 0844   PROT 9.1 (H) 05/12/2021 0844   ALBUMIN 4.1 05/12/2021 0844     Present during today's visit: patient and her daughter  Assessment and Plan: Start plan: She will begin her lenalidomide today 05/12/21   Patient Education I spoke with patient for overview of new oral chemotherapy medication: lenalidomide (re-education, patient has taken lenalidomide before)  Administration: Counseled patient on administration, dosing, side effects, monitoring, drug-food interactions, safe handling, storage, and disposal. Patient will take 1 capsule (15 mg total) by mouth daily. Take for 21 days, then hold for 7 days. Repeat every 28 days.  Side Effects: Side effects include but not limited to: rash/itchy skin, fatigue, N/V, constipation or diarrhea, decreased wbc/hgb/plt. Bowel movements: In the past on lenalidomide she had constipation occasionally, she uses Miralax prn Nausea: the has medications to use prn at home  Adherence: After discussion with patient no patient barriers to medication adherence identified.  Reviewed with patient importance of keeping a medication schedule and plan for any missed doses.  Ms. Suits voiced understanding and appreciation. All questions answered. Medication handout and calendar provided.  Provided patient with Oral [facility] phone number. Patient knows to call the office with questions or concerns. Oral Chemotherapy [facility] will continue to follow.  Patient expressed understanding and was in agreement with this plan. She also understands that She can call clinic at any time with any questions, concerns, or complaints.   Medication Access Issues: No issues, fills lenalidomide at [place]  Thank you for allowing me to participate in the  care of this patient.   Time Total: 15 mins  Visit consisted of counseling and education on dealing with issues of symptom management in the setting of serious and potentially life-threatening illness.Greater than 50%  of this time was spent counseling and coordinating care related to the above assessment and plan.  Signed by: Darl Pikes, PharmD, BCPS, Salley Slaughter, CPP Hematology/Oncology Clinical Pharmacist Practitioner [facility]/HP/AP [facility] [phone]  05/12/2021 2:37 PM

## 2021-05-15 ENCOUNTER — Other Ambulatory Visit: Payer: Self-pay

## 2021-05-15 ENCOUNTER — Encounter: Payer: Self-pay | Admitting: Physician Assistant

## 2021-05-15 ENCOUNTER — Ambulatory Visit (INDEPENDENT_AMBULATORY_CARE_PROVIDER_SITE_OTHER): Payer: Medicare Other | Admitting: Physician Assistant

## 2021-05-15 VITALS — BP 160/80 | HR 50 | Ht 62.0 in | Wt 170.0 lb

## 2021-05-15 DIAGNOSIS — C9000 Multiple myeloma not having achieved remission: Secondary | ICD-10-CM | POA: Diagnosis not present

## 2021-05-15 DIAGNOSIS — I5032 Chronic diastolic (congestive) heart failure: Secondary | ICD-10-CM

## 2021-05-15 DIAGNOSIS — I10 Essential (primary) hypertension: Secondary | ICD-10-CM

## 2021-05-15 DIAGNOSIS — K219 Gastro-esophageal reflux disease without esophagitis: Secondary | ICD-10-CM

## 2021-05-15 DIAGNOSIS — I5033 Acute on chronic diastolic (congestive) heart failure: Secondary | ICD-10-CM | POA: Diagnosis not present

## 2021-05-15 DIAGNOSIS — J449 Chronic obstructive pulmonary disease, unspecified: Secondary | ICD-10-CM | POA: Diagnosis not present

## 2021-05-15 NOTE — Progress Notes (Addendum)
Office Visit    Patient Name: Brandi [name] Date of Encounter: 05/15/2021  PCP:  [facility] [place]  Cardiologist:  Nelva Bush, MD  Advanced Practice Provider:  No care team member to display Electrophysiologist:  None [id number]   Chief Complaint    Chief Complaint  Patient presents with   Other    6 month f/u c/o sob. Meds reviewed verbally with pt.    70 y.o. female with history of multiple myeloma diagnosed 2018 s/p chemotherapy and autologous stem cell transplant 05/2018 maintenance Revlimid therapy, hypertension, lower extremity edema, COPD, tobacco use, and dyspnea, and who presents today for follow-up  Past Medical History    Past Medical History:  Diagnosis Date   Anxiety    At risk for cardiac dysfunction    CHF (congestive heart failure) (HCC)    COPD (chronic obstructive pulmonary disease) ([place])    Hypertension    Hyperthyroidism    Hypokalemia    IgA myeloma (HCC)    Multiple myeloma ([place])    Pneumonia    Red blood cell antibody positive, compatible PRBC difficult to obtain    S/P autologous bone marrow transplantation [facility])    Past Surgical History:  Procedure Laterality Date   ESOPHAGOGASTRODUODENOSCOPY (EGD) WITH PROPOFOL N/A 03/30/2019   Procedure: ESOPHAGOGASTRODUODENOSCOPY (EGD) WITH PROPOFOL;  Surgeon: Jonathon Bellows, MD;  Location: [facility] ENDOSCOPY;  Service: Gastroenterology;  Laterality: N/A;   IR FLUORO GUIDE PORT INSERTION RIGHT  12/16/2017   THYROIDECTOMY N/A     Allergies  No Known Allergies  History of Present Illness    Brandi [name] is a 70 y.o. female with PMH as above.  She was seen by Dr. Rogue Bussing 08/2019 and Revlimid was held in the setting of lower extremity edema.  TTE 07/2019 with preserved LVEF and normal diastolic function.  PASP at least moderately elevated.  During previous myeloma treatment, she had IJ DVT treated with Xarelto.  She was seen by Dr. Saunders Revel 09/26/2019  with 2 to 3 months worsening lower extremity edema and dyspnea, consistently NYHA class IIIb heart failure. She has been seen regularly in clinic for escalation of GDMT. She is now s/p thyroid removal with improvement in her sx associated with her goiter and abnormal thyroid.  At her last office visit, it was noted she had self reduced her diuresis to every other day.  Her diuresis was transiently increased for 3 days for volume overload.  She was instructed to return to daily Lasix at after that time.  CHF education was provided.  Today, 05/15/2021, she returns to clinic and notes that she takes her Lasix 3-4 times a month.  She avoids taking her fluid pill due to needing to use the restroom frequently.  She reports that she feels short of breath at rest and with exertion.  She is not able to walk 1 block before becoming short of breath.  She is also noticed swelling, bendopnea, weight gain, abdominal distention, and early satiety.  Despite her early satiety, she attributes her weight gain to eating too much. Wt 139lbs  170lbs. No orthopnea.  No CP, presyncope, syncope, s/sx of bleeding.  She has poor energy.  She reports she drinks a lot of coffee, estimating 3 cups/day.  She drinks 1 cup of soda per day.  She estimates 2 bottles of water per day.  She mainly eats out for dinner.  At home, she reports that she does not add salt  to her fluid, but on review of her diet, she does eat high salt ticket items.  Her daughter is with her and is surprised by the infrequency at which she takes Lasix and is going to assist her mother and taking her Lasix as prescribed and reducing salt in her diet.    Home Medications   Current Outpatient Medications  Medication Instructions   acyclovir (ZOVIRAX) 400 mg, Oral, 2 times daily   albuterol (VENTOLIN HFA) 108 (90 Base) MCG/ACT inhaler 2 puffs, Inhalation, Every 6 hours PRN   aspirin 81 mg, Oral, Daily   buPROPion (WELLBUTRIN) 75 mg, Oral, Daily   dexamethasone  (DECADRON) 4 MG tablet Start 2 days prior to infusion; Take for 2 days. Do not take on the day of infusion.   ergocalciferol (VITAMIN D2) 50,000 Units, Oral, Weekly   feeding supplement, ENSURE ENLIVE, (ENSURE ENLIVE) LIQD 237 mLs, Oral, 2 times daily between meals   Fluticasone-Salmeterol (ADVAIR DISKUS) 500-50 MCG/DOSE AEPB 1 puff, Inhalation, 2 times daily   furosemide (LASIX) 40 mg, Oral, Daily   Ipratropium-Albuterol (COMBIVENT) 20-100 MCG/ACT AERS respimat Inhalation   ipratropium-albuterol (DUONEB) 0.5-2.5 (3) MG/3ML SOLN 3 mLs, Nebulization, Every 4 hours PRN   lenalidomide (REVLIMID) 15 mg, Oral, Daily, 3 weeks on and 1 week off   levothyroxine (SYNTHROID) 112 mcg, Oral, Daily   metoprolol succinate (TOPROL-XL) 25 MG 24 hr tablet TAKE 1 & 1/2 (ONE & ONE-HALF) TABLETS BY MOUTH ONCE DAILY   mirtazapine (REMERON) 15 mg, Oral, Daily at bedtime   montelukast (SINGULAIR) 10 mg, Oral, Daily at bedtime, START 2 days prior to chemo infusion. Take For 2 days; Do NOT take on the day of infusion.   ondansetron (ZOFRAN) 8 MG tablet One pill every 8 hours as needed for nausea/vomitting.   pantoprazole (PROTONIX) 40 mg, Oral, Daily   polyethylene glycol (MIRALAX / GLYCOLAX) 17 g, Oral, Daily PRN   potassium chloride SA (KLOR-CON) 20 MEQ tablet 20 mEq, Oral, 2 times daily   prochlorperazine (COMPAZINE) 10 mg, Oral, Every 6 hours PRN   spironolactone (ALDACTONE) 25 MG tablet Take 1 tablet by mouth once daily     Review of Systems    She reports shortness of breath, dyspnea, bendopnea, fatigue, weight gain, early satiety, abdominal distention, swelling.  She denies chest pain, palpitations, orthopnea, n, v, dizziness, syncope.  All other systems reviewed and are otherwise negative except as noted above.  Physical Exam    VS:  BP (!) 160/80 (BP Location: Left Arm, Patient Position: Sitting, Cuff Size: Large)   Pulse (!) 50   Ht _0  (1.575 m)   Wt 170 lb (77.1 kg)   SpO2 98%   BMI 31.09 kg/m   , BMI Body mass index is 31.09 kg/m. GEN: Well nourished, well developed, in no acute distress.  Joined by her daughter. HEENT: normal. Neck: Supple, thyroidectomy scar visualized, JVP approximately 10-11cm cm.  No carotid bruits, or masses. Cardiac: Bradycardic but regular, 1/6 systolic murmur. No rubs, or gallops. No clubbing, cyanosis. moderate to 1+ bilateral lower extremity edema.  Radials/DP/PT 2+ and equal bilaterally.  Respiratory: Work of breathing noted, reduced right basilar breath sounds GI: Mildly distended and firm, BS + x 4. MS: no deformity or atrophy. Skin: warm and dry, no rash. Neuro:  Strength and sensation are intact. Psych: Normal affect.  Accessory Clinical Findings    ECG personally reviewed by me today -sinus bradycardia, 50 bpm, consider WAP- no acute changes.  VITALS Reviewed today  Temp Readings from Last 3 Encounters:  05/12/21 98.4 F (36.9 C)  05/12/21 98.1 F (36.7 C) (Tympanic)  04/28/21 98 F (36.7 C) (Tympanic)   BP Readings from Last 3 Encounters:  05/15/21 (!) 160/80  05/12/21 (!) 148/75  05/12/21 (!) 157/81   Pulse Readings from Last 3 Encounters:  05/15/21 (!) 50  05/12/21 (!) 48  05/12/21 (!) 45    Wt Readings from Last 3 Encounters:  05/15/21 170 lb (77.1 kg)  05/12/21 159 lb 9.6 oz (72.4 kg)  04/28/21 154 lb 8 oz (70.1 kg)     LABS  reviewed today   Lab Results  Component Value Date   WBC 11.6 (H) 05/12/2021   HGB 10.7 (L) 05/12/2021   HCT 34.5 (L) 05/12/2021   MCV 87.3 05/12/2021   PLT 215 05/12/2021   Lab Results  Component Value Date   CREATININE 0.92 05/12/2021   BUN 21 05/12/2021   NA 138 05/12/2021   K 4.2 05/12/2021   CL 108 05/12/2021   CO2 24 05/12/2021   Lab Results  Component Value Date   ALT 11 05/12/2021   AST 17 05/12/2021   GGT 143 (H) 03/26/2019   ALKPHOS 125 05/12/2021   BILITOT 0.2 (L) 05/12/2021   No results found for: CHOL, HDL, LDLCALC, LDLDIRECT, TRIG, CHOLHDL  No results found for:  HGBA1C Lab Results  Component Value Date   TSH <0.005 (L) 01/14/2021     STUDIES/PROCEDURES reviewed today   Echo 08/20/19 1. Left ventricular ejection fraction, by visual estimation, is 60 to  65%. The left ventricle has normal function. There is no left ventricular  hypertrophy.   2. The left ventricle has no regional wall motion abnormalities.   3. Global right ventricle has normal systolic function.The right  ventricular size is normal. No increase in right ventricular wall  thickness.   4. Left atrial size was normal.   5. Right atrial size was normal.   6. The mitral valve is normal in structure. Trivial mitral valve  regurgitation.   7. The tricuspid valve is normal in structure.   8. The tricuspid valve is normal in structure. Tricuspid valve  regurgitation is trivial.   9. The aortic valve is normal in structure. Aortic valve regurgitation is  trivial.  10. The pulmonic valve was normal in structure. Pulmonic valve  regurgitation is not visualized.  11. Moderately elevated pulmonary artery systolic pressure.   Assessment & Plan    Acute on chronic HFpEF --Volume up on exam. Echo 08/20/19 with LVEF 60-65%, no LVH, no RWMA, trivial MR. Wt increased 30 lbs from previous clinic visits - pt attributes to caloric intake and not volume. She only takes her lasix 3-4 times per month, rather than daily. Restarted lasix 64m daily as prescribed previously and also recommend an additional lasix for 3 lbs overnight or 5 lbs in a week with pt understanding. She has a recent BMET which is stable. Repeat BMET at RTC. Continue BB, restarted lasix, spironolactone, KCL tab 217m BID. Reassess sx closely in 1-2 weeks. Could consider echo at RTC  pending reassessment of sx s/p diuresis- will defer for now.    Hypertension --BP sub-optimal and suspect improvement with restart of diuresis. Continue restarted lasix, spironolactone, and Toprol. As above, recommendation was to increase back to lasix  every day with both patient and daughter understanding.   Multiple myeloma / anemia --Continue to follow with oncology.   COPD --Likely contributes [place] Dr.  Aleskerov.   GERD --Continue PPI.    Multinodular goiter, hyperthyroidism --S/p thyroidectomy.   Medication changes: RESTART lasix and take as prescribed with KCL supplement Labs ordered: None.  Studies / Imaging ordered: None.  Future considerations: BMET at RTC. Could consider echo in future. Disposition: RTC 1-2 weeks to recheck volume   *Please be aware that the above documentation was completed voice recognition software and may contain dictation errors.    Arvil Chaco, PA-C 05/15/2021

## 2021-05-15 NOTE — Patient Instructions (Signed)
Medication Instructions:  -Your physician has recommended you make the following change in your medication:   1) Lasix (furosemide) 40 mg- take 1 tablet by mouth ONCE DAILY as prescribed  *If you need a refill on your cardiac medications before your next appointment, please call your pharmacy*   Lab Work: - none ordered  If you have labs (blood work) drawn today and your tests are completely normal, you will receive your results only by: [place] (if you have MyChart) OR A paper copy in the mail If you have any lab test that is abnormal or we need to change your treatment, we will call you to review the results.   Testing/Procedures: - none ordered   Follow-Up: At [facility], you and your health needs are our priority.  As part of our continuing mission to provide you with exceptional heart care, we have created designated Provider Care Teams.  These Care Teams include your primary Cardiologist (physician) and Advanced Practice Providers (APPs -  Physician Assistants and Nurse Practitioners) who all work together to provide you with the care you need, when you need it.  We recommend signing up for the patient portal called "MyChart".  Sign up information is provided on this After Visit Summary.  MyChart is used to connect with patients for Virtual Visits (Telemedicine).  Patients are able to view lab/test results, encounter notes, upcoming appointments, etc.  Non-urgent messages can be sent to your provider as well.   To learn more about what you can do with MyChart, go to [email or web address].    Your next appointment:   2 week(s)  The format for your next appointment:   In Person  Provider:   You may see Nelva Bush, MD or one of the following Advanced Practice Providers on your designated Care Team:   Murray Hodgkins, NP Christell Faith, PA-C Marrianne Mood, PA-C Cadence Kathlen Mody, [place]   Other Instructions N/a

## 2021-05-17 ENCOUNTER — Encounter: Payer: Self-pay | Admitting: Physician Assistant

## 2021-05-18 ENCOUNTER — Telehealth: Payer: Self-pay | Admitting: Internal Medicine

## 2021-05-18 ENCOUNTER — Inpatient Hospital Stay: Payer: Medicare Other

## 2021-05-18 ENCOUNTER — Other Ambulatory Visit: Payer: Self-pay

## 2021-05-18 ENCOUNTER — Inpatient Hospital Stay (HOSPITAL_BASED_OUTPATIENT_CLINIC_OR_DEPARTMENT_OTHER): Payer: Medicare Other | Admitting: Hospice and Palliative Medicine

## 2021-05-18 ENCOUNTER — Telehealth: Payer: Self-pay

## 2021-05-18 ENCOUNTER — Inpatient Hospital Stay: Payer: Medicare Other | Admitting: Oncology

## 2021-05-18 VITALS — BP 167/82 | HR 66 | Temp 99.1°F | Resp 18

## 2021-05-18 DIAGNOSIS — R252 Cramp and spasm: Secondary | ICD-10-CM | POA: Diagnosis not present

## 2021-05-18 DIAGNOSIS — E876 Hypokalemia: Secondary | ICD-10-CM

## 2021-05-18 DIAGNOSIS — D649 Anemia, unspecified: Secondary | ICD-10-CM

## 2021-05-18 DIAGNOSIS — Z5112 Encounter for antineoplastic immunotherapy: Secondary | ICD-10-CM | POA: Diagnosis not present

## 2021-05-18 DIAGNOSIS — C9000 Multiple myeloma not having achieved remission: Secondary | ICD-10-CM

## 2021-05-18 LAB — COMPREHENSIVE METABOLIC PANEL
ALT: 17 U/L (ref 0–44)
AST: 21 U/L (ref 15–41)
Albumin: 3.9 g/dL (ref 3.5–5.0)
Alkaline Phosphatase: 106 U/L (ref 38–126)
Anion gap: 4 — ABNORMAL LOW (ref 5–15)
BUN: 17 mg/dL (ref 8–23)
CO2: 27 mmol/L (ref 22–32)
Calcium: 7.6 mg/dL — ABNORMAL LOW (ref 8.9–10.3)
Chloride: 101 mmol/L (ref 98–111)
Creatinine, Ser: 0.95 mg/dL (ref 0.44–1.00)
GFR, Estimated: >60 mL/min (ref >60)
Glucose, Bld: 105 mg/dL — ABNORMAL HIGH (ref 70–99)
Potassium: 3.6 mmol/L (ref 3.5–5.1)
Sodium: 132 mmol/L — ABNORMAL LOW (ref 135–145)
Total Bilirubin: 0.4 mg/dL (ref 0.3–1.2)
Total Protein: 8.1 g/dL (ref 6.5–8.1)

## 2021-05-18 LAB — CBC WITH DIFFERENTIAL/PLATELET
Abs Immature Granulocytes: 0.01 10*3/uL (ref 0.00–0.07)
Basophils Absolute: 0 10*3/uL (ref 0.0–0.1)
Basophils Relative: 0 %
Eosinophils Absolute: 0.3 10*3/uL (ref 0.0–0.5)
Eosinophils Relative: 4 %
HCT: 34.5 % — ABNORMAL LOW (ref 36.0–46.0)
Hemoglobin: 10.8 g/dL — ABNORMAL LOW (ref 12.0–15.0)
Immature Granulocytes: 0 %
Lymphocytes Relative: 19 %
Lymphs Abs: 1.3 10*3/uL (ref 0.7–4.0)
MCH: 27.2 pg (ref 26.0–34.0)
MCHC: 31.3 g/dL (ref 30.0–36.0)
MCV: 86.9 fL (ref 80.0–100.0)
Monocytes Absolute: 0.2 10*3/uL (ref 0.1–1.0)
Monocytes Relative: 3 %
Neutro Abs: 5 10*3/uL (ref 1.7–7.7)
Neutrophils Relative %: 74 %
Platelets: 159 10*3/uL (ref 150–400)
RBC: 3.97 MIL/uL (ref 3.87–5.11)
RDW: 16 % — ABNORMAL HIGH (ref 11.5–15.5)
WBC: 6.8 10*3/uL (ref 4.0–10.5)
nRBC: 0 % (ref 0.0–0.2)

## 2021-05-18 LAB — MAGNESIUM: Magnesium: 1.9 mg/dL (ref 1.7–2.4)

## 2021-05-18 MED ORDER — SODIUM CHLORIDE 0.9 % IV SOLN
INTRAVENOUS | Status: DC
  Filled 2021-05-18 (×2): qty 250

## 2021-05-18 MED ORDER — SODIUM CHLORIDE 0.9 % IV SOLN: 1.0000 g | Freq: Once | INTRAVENOUS | Status: DC

## 2021-05-18 MED ORDER — HYDROCODONE-ACETAMINOPHEN 5-325 MG PO TABS: 1.0000 | ORAL_TABLET | Freq: Four times a day (QID) | ORAL | 0 refills | Status: DC | PRN

## 2021-05-18 MED ORDER — OYSTER SHELL CALCIUM/D3 500-5 MG-MCG PO TABS: 1.0000 | ORAL_TABLET | Freq: Two times a day (BID) | ORAL | 0 refills | Status: DC

## 2021-05-18 MED ORDER — CALCIUM GLUCONATE-NACL 1-0.675 GM/50ML-% IV SOLN
1.0000 g | Freq: Once | INTRAVENOUS | Status: AC
  Administered 2021-05-18: 1000 mg via INTRAVENOUS
  Filled 2021-05-18: qty 50

## 2021-05-18 NOTE — Telephone Encounter (Signed)
Pt contacted and scheduled for labs and smc this afternoon.

## 2021-05-18 NOTE — Telephone Encounter (Signed)
Please see note.

## 2021-05-18 NOTE — Progress Notes (Signed)
Patient tolerated IV fluids with calcium infusion well today, no concerns voiced. Patient discharged. Stable.

## 2021-05-18 NOTE — Telephone Encounter (Signed)
Spoke to patient via telephone at 9:55 am. Patient complaining of cramp like pain all over her body (neck, arms, sides, hands, legs) on and off since Saturday. Sharp pain that comes and goes, sometimes lasting 5 minutes. Per Dr. Jacinto Reap., patient to be seen in [facility] by NP today + labs.

## 2021-05-18 NOTE — Progress Notes (Signed)
Pt reports "muscle spasms" that started over the weekend. She reports that they are "all over", noting arms, legs, ribs, chest, and neck. States that she recently started revlimid. But did not have this problem when she took it before. Denies nausea, vomiting, or diarrhea.

## 2021-05-18 NOTE — Patient Instructions (Signed)
Calcium Gluconate Injection What is this medication? CALCIUM GLUCONATE (KAL see um GLOO koe nate) increases calcium levels in your body. Calcium is a mineral that plays an important role in building strong bones and maintaining heart health. This medicine may be used for other purposes; ask your health care provider or pharmacist if you have questions. What should I tell my care team before I take this medication? They need to know if you have any of these conditions: High levels of calcium in the blood History of irregular heartbeat History of kidney stones Kidney disease Parathyroid disease An unusual or allergic reaction to calcium, other medications, foods, dyes, or preservatives Pregnant or trying to get pregnant Breast-feeding How should I use this medication? This medication is injected into a vein. It is given by your care team in a hospital or clinic. Talk to your care team regarding the use of this medication in children. While it may be given to children for selected conditions, precautions do apply. Overdosage: If you think you have taken too much of this medicine contact a poison control center or emergency room at once. NOTE: This medicine is only for you. Do not share this medicine with others. What if I miss a dose? This does not apply. What may interact with this medication? Ceftriaxone Certain diuretics Digoxin Other calcium products This list may not describe all possible interactions. Give your health care provider a list of all the medicines, herbs, non-prescription drugs, or dietary supplements you use. Also tell them if you smoke, drink alcohol, or use illegal drugs. Some items may interact with your medicine. What should I watch for while using this medication? Your condition will be monitored carefully while you are receiving this medication. You may need blood work while you are taking this medication. What side effects may I notice from receiving this  medication? Side effects that you should report to your care team as soon as possible: Allergic reactions-skin rash, itching, hives, swelling of the face, lips, tongue, or throat Fast or irregular heartbeat High calcium level-increased thirst or amount of urine, nausea, vomiting, confusion, unusual weakness or fatigue, bone pain Low blood pressure-dizziness, feeling faint or lightheaded, blurry vision Pain, redness, or irritation at injection site Side effects that usually do not require medical attention (report to your care team if they continue or are bothersome): Change in taste Flushing, mostly over the face, neck, and chest, during injection This list may not describe all possible side effects. Call your doctor for medical advice about side effects. You may report side effects to FDA at 1-800-FDA-1088. Where should I keep my medication? This medication is given in a hospital or clinic. It will not be stored at home. NOTE: This sheet is a summary. It may not cover all possible information. If you have questions about this medicine, talk to your doctor, pharmacist, or health care provider.  2022 Elsevier/Gold Standard (2020-07-10 13:30:41)

## 2021-05-18 NOTE — Progress Notes (Signed)
[facility]  [phone] [phone]) [phone]  Patient Care Team: [facility], [facility] as PCP - General ([place]) End, Harrell Gave, MD as PCP - Cardiology (Cardiology) [name], [name] [name], MD as Consulting Physician (Hematology and Oncology) Ottie Glazier, MD as Consulting Physician (Pulmonary Disease) Gabriel Carina Betsey Holiday, MD as Consulting Physician (Endocrinology)   Name of the patient: Brandi [name]  [id number]  1951/07/11   Date of visit: 05/18/21  Reason for Consult: Ms. Brandi [name] is a 70 year old woman with multiple medical problems including hyperthyroidism/goiter on methimazole, COPD, diastolic dysfunction with history of CHF, and stage III multiple myeloma status post stem cell transplant with Revlimid maintenance.  Patient last saw Dr. Rogue Bussing on 05/12/2021 with concerned that she had relapsed multiple myeloma and was started on salvage daratumumab-Revlimid-dexamethasone.  Patient presents to [facility] today for evaluation of muscle cramps.  She reports that after starting treatment last week she started developing intense generalized muscle cramps in her arms and legs.  Cramps last a few seconds and the relief because some residual pain.  She denies fever or chills but says that she has had occasional cough and some rhinorrhea over the past 2 to 3 days.  No shortness of breath or chest pain.  No GI or GU symptoms.  Denies any neurologic complaints. Denies recent fevers or illnesses. Denies any easy bleeding or bruising. Reports good appetite and denies weight loss. Denies chest pain. Denies any nausea, vomiting, constipation, or diarrhea. Denies urinary complaints. Patient offers no further specific complaints today.  PAST MEDICAL HISTORY: Past Medical History:  Diagnosis Date   Anxiety    At risk for cardiac dysfunction    CHF  (congestive heart failure) (HCC)    COPD (chronic obstructive pulmonary disease) (HCC)    Hypertension    Hyperthyroidism    Hypokalemia    IgA myeloma (HCC)    Multiple myeloma (HCC)    Pneumonia    Red blood cell antibody positive, compatible PRBC difficult to obtain    S/P autologous bone marrow transplantation ([place])     PAST SURGICAL HISTORY:  Past Surgical History:  Procedure Laterality Date   ESOPHAGOGASTRODUODENOSCOPY (EGD) WITH PROPOFOL N/A 03/30/2019   Procedure: ESOPHAGOGASTRODUODENOSCOPY (EGD) WITH PROPOFOL;  Surgeon: Jonathon Bellows, MD;  Location: [facility] ENDOSCOPY;  Service: Gastroenterology;  Laterality: N/A;   IR FLUORO GUIDE PORT INSERTION RIGHT  12/16/2017   THYROIDECTOMY N/A     HEMATOLOGY/ONCOLOGY HISTORY:  Oncology History Overview Note  # SEP 2018- MULTIPLE MYELOMA IgALamda [2.5 gm/dl; K/L= 88/1298]; STAGE III [beta 2 microglobulin=5.5] [presented with acute renal failure; anemia; NO hypercalcemia; Skeletal survey-Normal]; BMBx- 45% plasma cells; FISH-POSITIVE 11:14 translocation.[STANDARD-high RISK]/cyto-Normal; SEP 2018- PET- L3 posterior element lesion.   # 9/14- velcade SQ twice weekly/Dex 40 mg/week; OCT 5th 2018-Start R [53m]VD; 3cycles of RVD- PARTAL RESPONSE  # Jan 11th 2019-Dara-Rev-Dex; April 2019- BMBx- plasma cell -by CD-138/IHC-80% [baseline Sep 2018- 85% ]; HOLD transplant [dw Dr.Gasperatto]  # April 29th 2019 2019- carfil-Cyt-Dex; AUG 6th BMBx- 6% plasma cells; VGPR  # Autologous stem cell transplant on 06/15/18 [[facility]/ Dr.Gasperrato]  # may 1st week-2019- Maintenance Revlimid 10 mg 3w/1w;   FEB 10th 2021- [[facility]]Cellular marrow (50%) with normal trilineage hematopoiesis. No morphologic support for residual myeloma disease. Negative for minimal residual disease by MM-MRD flow cytometry; HOLD REVLIMID [leg swelling]; AUG 2021-PET scan negative for myeloma; continue to hold Revlimid  # MARCH 2021-diastolic congestive heart failure [Dr.End]  #  BMBx-  OCT 2021- BMBx- 5% plasmacytosis-however this appears to be more polyclonal rather than monoclonal-not explain patient worsening anemia.   # OCT 2022- 18th- Dara- Rev-Dex  #November 2021-hyperthyroidism/goiter- [D.Bennett/Dr.Solum]-methimazole. S/p Thyroidectomy [Dr.Kim; [facility] 9892]  --------------------------  # 12/12- RIGHT JUGULAR DVT-x 4mon xarelto; finished April 2020; September 2020-EGD/dysphagia; Dr. AVicente Males # Acute renal failure [Dr.Singh; Proteinuria 1.5gm/day ]; acyclovir/Asprin ------------------------------------------------------------------------------------------------------------   DIAGNOSIS: '[ ]'  MULTIPLE MYELOMA  STAGE: III/HIGH RISK ;GOALS: CONTROL  CURRENT/MOST RECENT THERAPY-maintenance Revlimid- on HOLD    IgA myeloma ([facility]  11/21/2017 - 04/28/2018 Chemotherapy   Patient is on Treatment Plan : MYELOMA SALVAGE Cyclophosphamide / Carfilzomib / Dexamethasone (CCd) q28d     05/12/2021 -  Chemotherapy   Patient is on Treatment Plan : MYELOMA RELAPSED REFRACTORY Daratumumab SQ + Lenalidomide + Dexamethasone (DaraRd) q28d       ALLERGIES:  has No Known Allergies.  MEDICATIONS:  Current Outpatient Medications  Medication Sig Dispense Refill   acyclovir (ZOVIRAX) 400 MG tablet Take 1 tablet (400 mg total) by mouth 2 (two) times daily. 60 tablet 3   albuterol (VENTOLIN HFA) 108 (90 Base) MCG/ACT inhaler Inhale 2 puffs into the lungs every 6 (six) hours as needed for wheezing or shortness of breath. 8 g 3   aspirin EC 81 MG EC tablet Take 1 tablet (81 mg total) by mouth daily. 90 tablet 3   buPROPion (WELLBUTRIN) 75 MG tablet Take 75 mg by mouth daily.     dexamethasone (DECADRON) 4 MG tablet Start 2 days prior to infusion; Take for 2 days. Do not take on the day of infusion. (Patient taking differently: Take 4 mg by mouth 2 (two) times daily. Start 2 days prior to infusion; Take for 2 days. Do not take on the day of infusion.) 60 tablet 3   ergocalciferol (VITAMIN D2)  1.25 MG (50000 UT) capsule Take 1 capsule (50,000 Units total) by mouth once a week. 12 capsule 2   feeding supplement, ENSURE ENLIVE, (ENSURE ENLIVE) LIQD Take 237 mLs by mouth 2 (two) times daily between meals. 60 Bottle 0   Fluticasone-Salmeterol (ADVAIR DISKUS) 500-50 MCG/DOSE AEPB Inhale 1 puff into the lungs 2 (two) times daily. 1 each 3   furosemide (LASIX) 40 MG tablet Take 1 tablet (40 mg total) by mouth daily. 90 tablet 3   Ipratropium-Albuterol (COMBIVENT) 20-100 MCG/ACT AERS respimat Inhale into the lungs.     ipratropium-albuterol (DUONEB) 0.5-2.5 (3) MG/3ML SOLN Take 3 mLs by nebulization every 4 (four) hours as needed. 360 mL 3   lenalidomide (REVLIMID) 15 MG capsule Take 1 capsule (15 mg total) by mouth daily. 3 weeks on and 1 week off 21 capsule 0   levothyroxine (SYNTHROID) 112 MCG tablet Take 112 mcg by mouth daily.     metoprolol succinate (TOPROL-XL) 25 MG 24 hr tablet TAKE 1 & 1/2 (ONE & ONE-HALF) TABLETS BY MOUTH ONCE DAILY 45 tablet 5   mirtazapine (REMERON) 15 MG tablet Take 1 tablet (15 mg total) by mouth at bedtime. 30 tablet 4   montelukast (SINGULAIR) 10 MG tablet Take 1 tablet (10 mg total) by mouth at bedtime. START 2 days prior to chemo infusion. Take For 2 days; Do NOT take on the day of infusion. 60 tablet 0   ondansetron (ZOFRAN) 8 MG tablet One pill every 8 hours as needed for nausea/vomitting. 40 tablet 1   pantoprazole (PROTONIX) 40 MG tablet Take 1 tablet (40 mg total) by mouth daily. 90 tablet 1   polyethylene  glycol (MIRALAX / GLYCOLAX) packet Take 17 g by mouth daily as needed for mild constipation. 14 each 0   potassium chloride SA (KLOR-CON) 20 MEQ tablet Take 1 tablet (20 mEq total) by mouth 2 (two) times daily. 60 tablet 6   prochlorperazine (COMPAZINE) 10 MG tablet Take 1 tablet (10 mg total) by mouth every 6 (six) hours as needed for nausea or vomiting. 40 tablet 1   spironolactone (ALDACTONE) 25 MG tablet Take 1 tablet by mouth once daily 90 tablet 0    No current facility-administered medications for this visit.   Facility-Administered Medications Ordered in Other Visits  Medication Dose Route Frequency Provider Last Rate Last Admin   0.9 %  sodium chloride infusion   Intravenous Continuous Monia Sabal, PA-C        VITAL SIGNS: BP 123/71   Pulse (!) 55   Temp 98.5 F (36.9 C) (Oral)   Resp 18   SpO2 97%  There were no vitals filed for this visit.  Estimated body mass index is 31.09 kg/m as calculated from the following:   Height as of 05/15/21: '5\' 2"'  (1.575 m).   Weight as of 05/15/21: 170 lb (77.1 kg).  LABS: CBC:    Component Value Date/Time   WBC 6.8 05/18/2021 1336   HGB 10.8 (L) 05/18/2021 1336   HCT 34.5 (L) 05/18/2021 1336   PLT 159 05/18/2021 1336   MCV 86.9 05/18/2021 1336   NEUTROABS 5.0 05/18/2021 1336   LYMPHSABS 1.3 05/18/2021 1336   MONOABS 0.2 05/18/2021 1336   EOSABS 0.3 05/18/2021 1336   BASOSABS 0.0 05/18/2021 1336   Comprehensive Metabolic Panel:    Component Value Date/Time   NA 132 (L) 05/18/2021 1336   NA 141 01/25/2020 1530   K 3.6 05/18/2021 1336   CL 101 05/18/2021 1336   CO2 27 05/18/2021 1336   BUN 17 05/18/2021 1336   BUN 19 01/25/2020 1530   CREATININE 0.95 05/18/2021 1336   GLUCOSE 105 (H) 05/18/2021 1336   CALCIUM 7.6 (L) 05/18/2021 1336   AST 21 05/18/2021 1336   ALT 17 05/18/2021 1336   ALKPHOS 106 05/18/2021 1336   BILITOT 0.4 05/18/2021 1336   PROT 8.1 05/18/2021 1336   ALBUMIN 3.9 05/18/2021 1336    RADIOGRAPHIC STUDIES: No results found.  PERFORMANCE STATUS (ECOG) : 0 - Asymptomatic  Review of Systems Unless otherwise noted, a complete review of systems is negative.  Physical Exam General: NAD HEENT: Swollen nasal turbinates, tenderness to palp frontal sinuses, ears clear bilaterally, no cervical lymphadenopathy, pupils equal round reactive to light Cardiovascular: regular rate and rhythm Pulmonary: clear ant fields Abdomen: soft, nontender, + bowel  sounds GU: no suprapubic tenderness Extremities: no edema, no joint deformities Skin: no rashes Neurological: Nonfocal  Assessment and Plan- Patient is a 70 y.o. female with multiple medical problems including hyperthyroidism/goiter on methimazole, COPD, diastolic dysfunction with history of CHF, and relapsed stage III multiple myeloma status post stem cell transplant recently restarted on treatment with daratumumab and Revlimid who presents to [facility] for evaluation of muscle cramps  Muscle cramps -patient mildly hypocalcemic and hyponatremic.  Revlimid can also be associated with muscle cramps.  Discussed with Dr. Rogue Bussing who recommended holding the Revlimid.  We will give IV fluids today and IV calcium.  We will start patient on Os-Cal with vitamin D. Norco 5-33m Q6H as needed for pain.  Recommend increasing oral hydration.  Patient to follow-up with Dr. BRogue Bussingnext week.   Case and plan discussed  with Dr. Rogue Bussing   Patient expressed understanding and was in agreement with this plan. She also understands that She can call clinic at any time with any questions, concerns, or complaints.   Thank you for allowing me to participate in the care of this very pleasant patient.   Time Total: 20 minutes  Visit consisted of counseling and education dealing with the complex and emotionally intense issues of symptom management and palliative care in the setting of serious and potentially life-threatening illness.Greater than 50%  of this time was spent counseling and coordinating care related to the above assessment and plan.  Signed by: Altha Harm, PhD, NP-C

## 2021-05-20 ENCOUNTER — Inpatient Hospital Stay: Payer: Medicare Other

## 2021-05-20 ENCOUNTER — Other Ambulatory Visit: Payer: Self-pay | Admitting: *Deleted

## 2021-05-20 ENCOUNTER — Other Ambulatory Visit: Payer: Self-pay | Admitting: Internal Medicine

## 2021-05-20 ENCOUNTER — Other Ambulatory Visit: Payer: Self-pay

## 2021-05-20 VITALS — BP 154/78 | HR 60 | Temp 98.7°F | Resp 18 | Wt 171.0 lb

## 2021-05-20 DIAGNOSIS — C9000 Multiple myeloma not having achieved remission: Secondary | ICD-10-CM

## 2021-05-20 DIAGNOSIS — E876 Hypokalemia: Secondary | ICD-10-CM

## 2021-05-20 DIAGNOSIS — Z5112 Encounter for antineoplastic immunotherapy: Secondary | ICD-10-CM | POA: Diagnosis not present

## 2021-05-20 DIAGNOSIS — R252 Cramp and spasm: Secondary | ICD-10-CM

## 2021-05-20 LAB — COMPREHENSIVE METABOLIC PANEL
ALT: 18 U/L (ref 0–44)
AST: 19 U/L (ref 15–41)
Albumin: 3.7 g/dL (ref 3.5–5.0)
Alkaline Phosphatase: 130 U/L — ABNORMAL HIGH (ref 38–126)
Anion gap: 11 (ref 5–15)
BUN: 17 mg/dL (ref 8–23)
CO2: 24 mmol/L (ref 22–32)
Calcium: 8.2 mg/dL — ABNORMAL LOW (ref 8.9–10.3)
Chloride: 104 mmol/L (ref 98–111)
Creatinine, Ser: 0.97 mg/dL (ref 0.44–1.00)
GFR, Estimated: >60 mL/min (ref >60)
Glucose, Bld: 103 mg/dL — ABNORMAL HIGH (ref 70–99)
Potassium: 3.1 mmol/L — ABNORMAL LOW (ref 3.5–5.1)
Sodium: 139 mmol/L (ref 135–145)
Total Bilirubin: 0.4 mg/dL (ref 0.3–1.2)
Total Protein: 7.9 g/dL (ref 6.5–8.1)

## 2021-05-20 LAB — CBC WITH DIFFERENTIAL/PLATELET
Abs Immature Granulocytes: 0.03 10*3/uL (ref 0.00–0.07)
Basophils Absolute: 0 10*3/uL (ref 0.0–0.1)
Basophils Relative: 0 %
Eosinophils Absolute: 0 10*3/uL (ref 0.0–0.5)
Eosinophils Relative: 0 %
HCT: 32.3 % — ABNORMAL LOW (ref 36.0–46.0)
Hemoglobin: 10 g/dL — ABNORMAL LOW (ref 12.0–15.0)
Immature Granulocytes: 0 %
Lymphocytes Relative: 12 %
Lymphs Abs: 1 10*3/uL (ref 0.7–4.0)
MCH: 27.3 pg (ref 26.0–34.0)
MCHC: 31 g/dL (ref 30.0–36.0)
MCV: 88.3 fL (ref 80.0–100.0)
Monocytes Absolute: 0.3 10*3/uL (ref 0.1–1.0)
Monocytes Relative: 4 %
Neutro Abs: 7.6 10*3/uL (ref 1.7–7.7)
Neutrophils Relative %: 84 %
Platelets: 190 10*3/uL (ref 150–400)
RBC: 3.66 MIL/uL — ABNORMAL LOW (ref 3.87–5.11)
RDW: 16.5 % — ABNORMAL HIGH (ref 11.5–15.5)
WBC: 9.1 10*3/uL (ref 4.0–10.5)
nRBC: 0 % (ref 0.0–0.2)

## 2021-05-20 MED ORDER — DIPHENHYDRAMINE HCL 25 MG PO CAPS
50.0000 mg | ORAL_CAPSULE | Freq: Once | ORAL | Status: AC
  Administered 2021-05-20: 50 mg via ORAL
  Filled 2021-05-20: qty 2

## 2021-05-20 MED ORDER — DEXAMETHASONE 4 MG PO TABS
20.0000 mg | ORAL_TABLET | Freq: Once | ORAL | Status: AC
  Administered 2021-05-20: 20 mg via ORAL
  Filled 2021-05-20: qty 5

## 2021-05-20 MED ORDER — MONTELUKAST SODIUM 10 MG PO TABS
10.0000 mg | ORAL_TABLET | Freq: Once | ORAL | Status: AC
  Administered 2021-05-20: 10 mg via ORAL
  Filled 2021-05-20: qty 1

## 2021-05-20 MED ORDER — ACETAMINOPHEN 325 MG PO TABS
650.0000 mg | ORAL_TABLET | Freq: Once | ORAL | Status: AC
  Administered 2021-05-20: 650 mg via ORAL
  Filled 2021-05-20: qty 2

## 2021-05-20 MED ORDER — DARATUMUMAB-HYALURONIDASE-FIHJ 1800-30000 MG-UT/15ML ~~LOC~~ SOLN
1800.0000 mg | Freq: Once | SUBCUTANEOUS | Status: AC
  Administered 2021-05-20: 1800 mg via SUBCUTANEOUS
  Filled 2021-05-20: qty 15

## 2021-05-20 NOTE — Progress Notes (Signed)
After giving pt chemo shot and providing her with updated appt list.  I instructed her that she needs to be watched for at least 30 minutes up to 2 hours post shot.  Told her I needed to clarify with MD,  verbalized understanding. Went to settle my next patient and returned to check on pt and she left. [place] aware.

## 2021-05-20 NOTE — Patient Instructions (Addendum)
[facility] ONCOLOGY  Discharge Instructions: Thank you for choosing [facility] to provide your oncology and hematology care.  If you have a lab appointment with the [facility], please go directly to the [facility] and check in at the registration area.  Wear comfortable clothing and clothing appropriate for easy access to any Portacath or PICC line.   We strive to give you quality time with your provider. You may need to reschedule your appointment if you arrive late (15 or more minutes).  Arriving late affects you and other patients whose appointments are after yours.  Also, if you miss three or more appointments without notifying the office, you may be dismissed from the clinic at the provider's discretion.      For prescription refill requests, have your pharmacy contact our office and allow 72 hours for refills to be completed.    Today you received the following chemotherapy and/or immunotherapy agents DARATUMUMAB       To help prevent nausea and vomiting after your treatment, we encourage you to take your nausea medication as directed.  BELOW ARE SYMPTOMS THAT SHOULD BE REPORTED IMMEDIATELY: *FEVER GREATER THAN 100.4 F (38 C) OR HIGHER *CHILLS OR SWEATING *NAUSEA AND VOMITING THAT IS NOT CONTROLLED WITH YOUR NAUSEA MEDICATION *UNUSUAL SHORTNESS OF BREATH *UNUSUAL BRUISING OR BLEEDING *URINARY PROBLEMS (pain or burning when urinating, or frequent urination) *BOWEL PROBLEMS (unusual diarrhea, constipation, pain near the anus) TENDERNESS IN MOUTH AND THROAT WITH OR WITHOUT PRESENCE OF ULCERS (sore throat, sores in mouth, or a toothache) UNUSUAL RASH, SWELLING OR PAIN  UNUSUAL VAGINAL DISCHARGE OR ITCHING   Items with * indicate a potential emergency and should be followed up as soon as possible or go to the Emergency Department if any problems should occur.  Please show the CHEMOTHERAPY ALERT CARD or IMMUNOTHERAPY ALERT CARD at  check-in to the Emergency Department and triage nurse.  Should you have questions after your visit or need to cancel or reschedule your appointment, please contact [facility]  [phone] and follow the prompts.  Office hours are 8:00 a.m. to 4:30 p.m. Monday - Friday. Please note that voicemails left after 4:00 p.m. may not be returned until the following business day.  We are closed weekends and major holidays. You have access to a nurse at all times for urgent questions. Please call the main number to the clinic [phone] and follow the prompts.  For any non-urgent questions, you may also contact your provider using MyChart. We now offer e-Visits for anyone 36 and older to request care online for non-urgent symptoms. For details visit [email or web address].   Also download the MyChart app! Go to the app store, search "MyChart", open the app, select [facility], and log in with your MyChart username and password.  Due to Covid, a mask is required upon entering the hospital/clinic. If you do not have a mask, one will be given to you upon arrival. For doctor visits, patients may have 1 support person aged 52 or older with them. For treatment visits, patients cannot have anyone with them due to current Covid guidelines and our immunocompromised population.   Daratumumab; Hyaluronidase Injection What is this medication? DARATUMUMAB; HYALURONIDASE (dar a toom ue mab / hye al ur ON i dase) is a monoclonal antibody. Hyaluronidase is used to improve the effects of daratumumab. It treats certain types of cancer. Some of the cancers treated are multiple myeloma and light-chain amyloidosis. This medicine may  be used for other purposes; ask your health care provider or pharmacist if you have questions. COMMON BRAND NAME(S): DARZALEX FASPRO What should I tell my care team before I take this medication? They need to know if you have any of these conditions: heart  disease infection especially a viral infection such as chickenpox, cold sores, herpes, or hepatitis B lung or breathing disease an unusual or allergic reaction to daratumumab, hyaluronidase, other medicines, foods, dyes, or preservatives pregnant or trying to get pregnant breast-feeding How should I use this medication? This medicine is for injection under the skin. It is given by a health care professional in a hospital or clinic setting. Talk to your pediatrician regarding the use of this medicine in children. Special care may be needed. Overdosage: If you think you have taken too much of this medicine contact a poison control center or emergency room at once. NOTE: This medicine is only for you. Do not share this medicine with others. What if I miss a dose? Keep appointments for follow-up doses as directed. It is important not to miss your dose. Call your doctor or health care professional if you are unable to keep an appointment. What may interact with this medication? Interactions have not been studied. This list may not describe all possible interactions. Give your health care provider a list of all the medicines, herbs, non-prescription drugs, or dietary supplements you use. Also tell them if you smoke, drink alcohol, or use illegal drugs. Some items may interact with your medicine. What should I watch for while using this medication? Your condition will be monitored carefully while you are receiving this medicine. This medicine can cause serious allergic reactions. To reduce your risk, your health care provider may give you other medicine to take before receiving this one. Be sure to follow the directions from your health care provider. This medicine can affect the results of blood tests to match your blood type. These changes can last for up to 6 months after the final dose. Your healthcare provider will do blood tests to match your blood type before you start treatment. Tell all of your  healthcare providers that you are being treated with this medicine before receiving a blood transfusion. This medicine can affect the results of some tests used to determine treatment response; extra tests may be needed to evaluate response. Do not become pregnant while taking this medicine or for 3 months after stopping it. Women should inform their health care provider if they wish to become pregnant or think they might be pregnant. There is a potential for serious side effects to an unborn child. Talk to your health care provider for more information. Do not breast-feed an infant while taking this medicine. What side effects may I notice from receiving this medication? Side effects that you should report to your doctor or health care professional as soon as possible: allergic reactions like skin rash, itching or hives, swelling of the face, lips, or tongue blurred vision breathing problems chest pain cough dizziness fast heartbeat feeling faint or lightheaded headache low blood counts - this medicine may decrease the number of white blood cells, red blood cells and platelets. You may be at increased risk for infections and bleeding. nausea, vomiting shortness of breath signs of decreased platelets or bleeding - bruising, pinpoint red spots on the skin, black, tarry stools, blood in the urine signs of decreased red blood cells - unusually weak or tired, feeling faint or lightheaded, falls signs of infection - fever  or chills, cough, sore throat, pain or difficulty passing urine Side effects that usually do not require medical attention (report these to your doctor or health care professional if they continue or are bothersome): back pain constipation diarrhea pain, tingling, numbness in the hands or feet muscle cramps swelling of the ankles, feet, hands tiredness trouble sleeping This list may not describe all possible side effects. Call your doctor for medical advice about side  effects. You may report side effects to FDA at 1-800-FDA-1088. Where should I keep my medication? This drug is given in a hospital or clinic and will not be stored at home. NOTE: This sheet is a summary. It may not cover all possible information. If you have questions about this medicine, talk to your doctor, pharmacist, or health care provider.  2022 Elsevier/Gold Standard (2020-08-21 12:51:54)

## 2021-05-22 ENCOUNTER — Encounter: Payer: Self-pay | Admitting: Internal Medicine

## 2021-05-22 ENCOUNTER — Other Ambulatory Visit: Payer: Self-pay

## 2021-05-22 ENCOUNTER — Inpatient Hospital Stay: Payer: Medicare Other

## 2021-05-22 VITALS — BP 170/83 | HR 60 | Temp 98.2°F | Resp 18

## 2021-05-22 DIAGNOSIS — E669 Obesity, unspecified: Secondary | ICD-10-CM | POA: Diagnosis present

## 2021-05-22 DIAGNOSIS — C9000 Multiple myeloma not having achieved remission: Secondary | ICD-10-CM | POA: Diagnosis present

## 2021-05-22 DIAGNOSIS — D709 Neutropenia, unspecified: Secondary | ICD-10-CM | POA: Diagnosis present

## 2021-05-22 DIAGNOSIS — J440 Chronic obstructive pulmonary disease with acute lower respiratory infection: Secondary | ICD-10-CM | POA: Diagnosis present

## 2021-05-22 DIAGNOSIS — E871 Hypo-osmolality and hyponatremia: Secondary | ICD-10-CM | POA: Diagnosis present

## 2021-05-22 DIAGNOSIS — J441 Chronic obstructive pulmonary disease with (acute) exacerbation: Principal | ICD-10-CM | POA: Diagnosis present

## 2021-05-22 DIAGNOSIS — I69398 Other sequelae of cerebral infarction: Secondary | ICD-10-CM

## 2021-05-22 DIAGNOSIS — I700 Atherosclerosis of aorta: Secondary | ICD-10-CM | POA: Diagnosis present

## 2021-05-22 DIAGNOSIS — F17210 Nicotine dependence, cigarettes, uncomplicated: Secondary | ICD-10-CM | POA: Diagnosis present

## 2021-05-22 DIAGNOSIS — J189 Pneumonia, unspecified organism: Secondary | ICD-10-CM | POA: Diagnosis not present

## 2021-05-22 DIAGNOSIS — Z20822 Contact with and (suspected) exposure to covid-19: Secondary | ICD-10-CM | POA: Diagnosis present

## 2021-05-22 DIAGNOSIS — Z7982 Long term (current) use of aspirin: Secondary | ICD-10-CM

## 2021-05-22 DIAGNOSIS — D849 Immunodeficiency, unspecified: Secondary | ICD-10-CM | POA: Diagnosis present

## 2021-05-22 DIAGNOSIS — E890 Postprocedural hypothyroidism: Secondary | ICD-10-CM | POA: Diagnosis present

## 2021-05-22 DIAGNOSIS — I110 Hypertensive heart disease with heart failure: Secondary | ICD-10-CM | POA: Diagnosis present

## 2021-05-22 DIAGNOSIS — E876 Hypokalemia: Secondary | ICD-10-CM | POA: Diagnosis present

## 2021-05-22 DIAGNOSIS — Z9981 Dependence on supplemental oxygen: Secondary | ICD-10-CM

## 2021-05-22 DIAGNOSIS — Z79899 Other long term (current) drug therapy: Secondary | ICD-10-CM

## 2021-05-22 DIAGNOSIS — J181 Lobar pneumonia, unspecified organism: Secondary | ICD-10-CM | POA: Diagnosis present

## 2021-05-22 DIAGNOSIS — F419 Anxiety disorder, unspecified: Secondary | ICD-10-CM | POA: Diagnosis present

## 2021-05-22 DIAGNOSIS — H547 Unspecified visual loss: Secondary | ICD-10-CM | POA: Diagnosis present

## 2021-05-22 DIAGNOSIS — Z6831 Body mass index (BMI) 31.0-31.9, adult: Secondary | ICD-10-CM

## 2021-05-22 DIAGNOSIS — I5032 Chronic diastolic (congestive) heart failure: Secondary | ICD-10-CM | POA: Diagnosis present

## 2021-05-22 DIAGNOSIS — J9621 Acute and chronic respiratory failure with hypoxia: Secondary | ICD-10-CM | POA: Diagnosis present

## 2021-05-22 DIAGNOSIS — Z79890 Hormone replacement therapy (postmenopausal): Secondary | ICD-10-CM

## 2021-05-22 MED ORDER — SODIUM CHLORIDE 0.9 % IV SOLN
2.0000 g | INTRAVENOUS | Status: DC | PRN
  Administered 2021-05-22: 2 g via INTRAVENOUS
  Filled 2021-05-22 (×2): qty 20

## 2021-05-22 MED ORDER — BENZONATATE 100 MG PO CAPS: 100.0000 mg | ORAL_CAPSULE | Freq: Three times a day (TID) | ORAL | 0 refills | Status: DC | PRN

## 2021-05-22 MED ORDER — SODIUM CHLORIDE 0.9 % IV SOLN
Freq: Once | INTRAVENOUS | Status: AC
  Filled 2021-05-22: qty 250

## 2021-05-22 MED ORDER — HYDROCOD POLST-CPM POLST ER 10-8 MG/5ML PO SUER: 5.0000 mL | Freq: Two times a day (BID) | ORAL | 0 refills | Status: DC | PRN

## 2021-05-22 NOTE — Progress Notes (Signed)
Patient presented to fluid clinic with cough occurring X 3 days. NP informed, Tessalon ordered and advised to take Mucinex OTC & Covid test. Patient verbalizes understanding. Patient tolerated  IVF infusion well today with 2g IV Ca. Patient discharged. Stable.

## 2021-05-24 ENCOUNTER — Emergency Department: Payer: Medicare Other

## 2021-05-24 ENCOUNTER — Other Ambulatory Visit: Payer: Self-pay

## 2021-05-24 ENCOUNTER — Inpatient Hospital Stay
Admission: EM | Admit: 2021-05-24 | Discharge: 2021-05-27 | DRG: 190 | Disposition: A | Discharge status: Home Health | Payer: Medicare Other | Attending: Internal Medicine | Admitting: Internal Medicine

## 2021-05-24 DIAGNOSIS — I69398 Other sequelae of cerebral infarction: Secondary | ICD-10-CM | POA: Diagnosis not present

## 2021-05-24 DIAGNOSIS — Z79899 Other long term (current) drug therapy: Secondary | ICD-10-CM | POA: Diagnosis not present

## 2021-05-24 DIAGNOSIS — F17210 Nicotine dependence, cigarettes, uncomplicated: Secondary | ICD-10-CM | POA: Diagnosis present

## 2021-05-24 DIAGNOSIS — J181 Lobar pneumonia, unspecified organism: Secondary | ICD-10-CM | POA: Diagnosis present

## 2021-05-24 DIAGNOSIS — E871 Hypo-osmolality and hyponatremia: Secondary | ICD-10-CM | POA: Diagnosis present

## 2021-05-24 DIAGNOSIS — J189 Pneumonia, unspecified organism: Secondary | ICD-10-CM | POA: Diagnosis present

## 2021-05-24 DIAGNOSIS — Z7982 Long term (current) use of aspirin: Secondary | ICD-10-CM

## 2021-05-24 DIAGNOSIS — I110 Hypertensive heart disease with heart failure: Secondary | ICD-10-CM | POA: Diagnosis present

## 2021-05-24 DIAGNOSIS — Z20822 Contact with and (suspected) exposure to covid-19: Secondary | ICD-10-CM | POA: Diagnosis present

## 2021-05-24 DIAGNOSIS — E038 Other specified hypothyroidism: Secondary | ICD-10-CM | POA: Diagnosis not present

## 2021-05-24 DIAGNOSIS — I700 Atherosclerosis of aorta: Secondary | ICD-10-CM | POA: Diagnosis present

## 2021-05-24 DIAGNOSIS — R0781 Pleurodynia: Secondary | ICD-10-CM | POA: Diagnosis not present

## 2021-05-24 DIAGNOSIS — J441 Chronic obstructive pulmonary disease with (acute) exacerbation: Secondary | ICD-10-CM | POA: Diagnosis present

## 2021-05-24 DIAGNOSIS — F419 Anxiety disorder, unspecified: Secondary | ICD-10-CM | POA: Diagnosis present

## 2021-05-24 DIAGNOSIS — E890 Postprocedural hypothyroidism: Secondary | ICD-10-CM | POA: Diagnosis present

## 2021-05-24 DIAGNOSIS — D849 Immunodeficiency, unspecified: Secondary | ICD-10-CM | POA: Diagnosis present

## 2021-05-24 DIAGNOSIS — E876 Hypokalemia: Secondary | ICD-10-CM | POA: Diagnosis present

## 2021-05-24 DIAGNOSIS — J440 Chronic obstructive pulmonary disease with acute lower respiratory infection: Secondary | ICD-10-CM | POA: Diagnosis present

## 2021-05-24 DIAGNOSIS — D709 Neutropenia, unspecified: Secondary | ICD-10-CM | POA: Diagnosis present

## 2021-05-24 DIAGNOSIS — L03116 Cellulitis of left lower limb: Secondary | ICD-10-CM | POA: Diagnosis present

## 2021-05-24 DIAGNOSIS — E039 Hypothyroidism, unspecified: Secondary | ICD-10-CM | POA: Diagnosis present

## 2021-05-24 DIAGNOSIS — J9621 Acute and chronic respiratory failure with hypoxia: Secondary | ICD-10-CM | POA: Diagnosis present

## 2021-05-24 DIAGNOSIS — A419 Sepsis, unspecified organism: Secondary | ICD-10-CM | POA: Diagnosis present

## 2021-05-24 DIAGNOSIS — Z79890 Hormone replacement therapy (postmenopausal): Secondary | ICD-10-CM

## 2021-05-24 DIAGNOSIS — D72819 Decreased white blood cell count, unspecified: Secondary | ICD-10-CM | POA: Diagnosis present

## 2021-05-24 DIAGNOSIS — Z6831 Body mass index (BMI) 31.0-31.9, adult: Secondary | ICD-10-CM

## 2021-05-24 DIAGNOSIS — I5032 Chronic diastolic (congestive) heart failure: Secondary | ICD-10-CM | POA: Diagnosis present

## 2021-05-24 DIAGNOSIS — C9000 Multiple myeloma not having achieved remission: Secondary | ICD-10-CM | POA: Diagnosis present

## 2021-05-24 DIAGNOSIS — Z9981 Dependence on supplemental oxygen: Secondary | ICD-10-CM | POA: Diagnosis not present

## 2021-05-24 DIAGNOSIS — H547 Unspecified visual loss: Secondary | ICD-10-CM | POA: Diagnosis present

## 2021-05-24 DIAGNOSIS — E669 Obesity, unspecified: Secondary | ICD-10-CM | POA: Diagnosis present

## 2021-05-24 DIAGNOSIS — C9001 Multiple myeloma in remission: Secondary | ICD-10-CM | POA: Diagnosis not present

## 2021-05-24 DIAGNOSIS — J449 Chronic obstructive pulmonary disease, unspecified: Secondary | ICD-10-CM

## 2021-05-24 DIAGNOSIS — L02416 Cutaneous abscess of left lower limb: Secondary | ICD-10-CM | POA: Diagnosis present

## 2021-05-24 LAB — TROPONIN I (HIGH SENSITIVITY)
Troponin I (High Sensitivity): 11 ng/L (ref <18)
Troponin I (High Sensitivity): 14 ng/L (ref <18)

## 2021-05-24 LAB — CBC WITH DIFFERENTIAL/PLATELET
Abs Immature Granulocytes: 0.01 10*3/uL (ref 0.00–0.07)
Basophils Absolute: 0 10*3/uL (ref 0.0–0.1)
Basophils Relative: 1 %
Eosinophils Absolute: 0 10*3/uL (ref 0.0–0.5)
Eosinophils Relative: 0 %
HCT: 35.7 % — ABNORMAL LOW (ref 36.0–46.0)
Hemoglobin: 11.5 g/dL — ABNORMAL LOW (ref 12.0–15.0)
Immature Granulocytes: 0 %
Lymphocytes Relative: 38 %
Lymphs Abs: 1.1 10*3/uL (ref 0.7–4.0)
MCH: 27.5 pg (ref 26.0–34.0)
MCHC: 32.2 g/dL (ref 30.0–36.0)
MCV: 85.4 fL (ref 80.0–100.0)
Monocytes Absolute: 0.2 10*3/uL (ref 0.1–1.0)
Monocytes Relative: 8 %
Neutro Abs: 1.5 10*3/uL — ABNORMAL LOW (ref 1.7–7.7)
Neutrophils Relative %: 53 %
Platelets: 255 10*3/uL (ref 150–400)
RBC: 4.18 MIL/uL (ref 3.87–5.11)
RDW: 16.1 % — ABNORMAL HIGH (ref 11.5–15.5)
WBC: 2.9 10*3/uL — ABNORMAL LOW (ref 4.0–10.5)
nRBC: 0 % (ref 0.0–0.2)

## 2021-05-24 LAB — COMPREHENSIVE METABOLIC PANEL
ALT: 15 U/L (ref 0–44)
AST: 24 U/L (ref 15–41)
Albumin: 3.8 g/dL (ref 3.5–5.0)
Alkaline Phosphatase: 98 U/L (ref 38–126)
Anion gap: 12 (ref 5–15)
BUN: 16 mg/dL (ref 8–23)
CO2: 22 mmol/L (ref 22–32)
Calcium: 7.3 mg/dL — ABNORMAL LOW (ref 8.9–10.3)
Chloride: 96 mmol/L — ABNORMAL LOW (ref 98–111)
Creatinine, Ser: 0.99 mg/dL (ref 0.44–1.00)
GFR, Estimated: >60 mL/min (ref >60)
Glucose, Bld: 112 mg/dL — ABNORMAL HIGH (ref 70–99)
Potassium: 3.5 mmol/L (ref 3.5–5.1)
Sodium: 130 mmol/L — ABNORMAL LOW (ref 135–145)
Total Bilirubin: 1.4 mg/dL — ABNORMAL HIGH (ref 0.3–1.2)
Total Protein: 8.6 g/dL — ABNORMAL HIGH (ref 6.5–8.1)

## 2021-05-24 LAB — RESP PANEL BY RT-PCR (FLU A&B, COVID) ARPGX2
Influenza A by PCR: NEGATIVE
Influenza B by PCR: NEGATIVE
SARS Coronavirus 2 by RT PCR: NEGATIVE

## 2021-05-24 LAB — LACTIC ACID, PLASMA
Lactic Acid, Venous: 1.9 mmol/L (ref 0.5–1.9)
Lactic Acid, Venous: 1.7 mmol/L (ref 0.5–1.9)

## 2021-05-24 LAB — BRAIN NATRIURETIC PEPTIDE: B Natriuretic Peptide: 123 pg/mL — ABNORMAL HIGH (ref 0.0–100.0)

## 2021-05-24 MED ORDER — BISACODYL 5 MG PO TBEC: 5.0000 mg | DELAYED_RELEASE_TABLET | Freq: Every day | ORAL | Status: DC | PRN

## 2021-05-24 MED ORDER — ONDANSETRON HCL 4 MG PO TABS: 8.0000 mg | ORAL_TABLET | Freq: Three times a day (TID) | ORAL | Status: DC | PRN

## 2021-05-24 MED ORDER — SODIUM CHLORIDE 0.9 % IV SOLN
500.0000 mg | Freq: Once | INTRAVENOUS | Status: AC
  Administered 2021-05-24: 500 mg via INTRAVENOUS
  Filled 2021-05-24: qty 500

## 2021-05-24 MED ORDER — SPIRONOLACTONE 25 MG PO TABS
25.0000 mg | ORAL_TABLET | Freq: Every day | ORAL | Status: DC
  Administered 2021-05-25 – 2021-05-27 (×3): 25 mg via ORAL
  Filled 2021-05-24 (×3): qty 1

## 2021-05-24 MED ORDER — VANCOMYCIN HCL IN DEXTROSE 1-5 GM/200ML-% IV SOLN
1000.0000 mg | Freq: Once | INTRAVENOUS | Status: AC
  Administered 2021-05-24: 1000 mg via INTRAVENOUS
  Filled 2021-05-24: qty 200

## 2021-05-24 MED ORDER — METOPROLOL SUCCINATE ER 25 MG PO TB24
37.5000 mg | ORAL_TABLET | Freq: Every day | ORAL | Status: DC
  Administered 2021-05-24 – 2021-05-27 (×4): 37.5 mg via ORAL
  Filled 2021-05-24 (×3): qty 1.5
  Filled 2021-05-24: qty 2

## 2021-05-24 MED ORDER — PANTOPRAZOLE SODIUM 40 MG PO TBEC
40.0000 mg | DELAYED_RELEASE_TABLET | Freq: Every day | ORAL | Status: DC
  Administered 2021-05-24 – 2021-05-27 (×4): 40 mg via ORAL
  Filled 2021-05-24 (×4): qty 1

## 2021-05-24 MED ORDER — SODIUM CHLORIDE 0.9 % IV SOLN
500.0000 mg | INTRAVENOUS | Status: DC
  Administered 2021-05-25 – 2021-05-26 (×2): 500 mg via INTRAVENOUS
  Filled 2021-05-24 (×3): qty 500

## 2021-05-24 MED ORDER — LEVOTHYROXINE SODIUM 112 MCG PO TABS
112.0000 ug | ORAL_TABLET | Freq: Every day | ORAL | Status: DC
  Administered 2021-05-25 – 2021-05-27 (×3): 112 ug via ORAL
  Filled 2021-05-24 (×3): qty 1

## 2021-05-24 MED ORDER — BUPROPION HCL 75 MG PO TABS
75.0000 mg | ORAL_TABLET | Freq: Every day | ORAL | Status: DC
  Administered 2021-05-24 – 2021-05-27 (×4): 75 mg via ORAL
  Filled 2021-05-24 (×4): qty 1

## 2021-05-24 MED ORDER — IOHEXOL 350 MG/ML SOLN
75.0000 mL | Freq: Once | INTRAVENOUS | Status: AC | PRN
  Administered 2021-05-24: 75 mL via INTRAVENOUS
  Filled 2021-05-24: qty 75

## 2021-05-24 MED ORDER — METHYLPREDNISOLONE SODIUM SUCC 125 MG IJ SOLR
60.0000 mg | Freq: Once | INTRAMUSCULAR | Status: AC
  Administered 2021-05-24: 60 mg via INTRAVENOUS
  Filled 2021-05-24: qty 2

## 2021-05-24 MED ORDER — IPRATROPIUM-ALBUTEROL 0.5-2.5 (3) MG/3ML IN SOLN
3.0000 mL | Freq: Once | RESPIRATORY_TRACT | Status: AC
  Administered 2021-05-24: 3 mL via RESPIRATORY_TRACT
  Filled 2021-05-24: qty 3

## 2021-05-24 MED ORDER — ACYCLOVIR 200 MG PO CAPS
400.0000 mg | ORAL_CAPSULE | Freq: Two times a day (BID) | ORAL | Status: DC
  Administered 2021-05-24 – 2021-05-27 (×6): 400 mg via ORAL
  Filled 2021-05-24 (×7): qty 2

## 2021-05-24 MED ORDER — MOMETASONE FURO-FORMOTEROL FUM 200-5 MCG/ACT IN AERO
2.0000 | INHALATION_SPRAY | Freq: Two times a day (BID) | RESPIRATORY_TRACT | Status: DC
Start: 2021-05-24 — End: 2021-05-27
  Administered 2021-05-24 – 2021-05-27 (×6): 2 via RESPIRATORY_TRACT
  Filled 2021-05-24: qty 8.8

## 2021-05-24 MED ORDER — BENZONATATE 100 MG PO CAPS
100.0000 mg | ORAL_CAPSULE | Freq: Three times a day (TID) | ORAL | Status: DC | PRN
  Administered 2021-05-24: 100 mg via ORAL
  Filled 2021-05-24 (×2): qty 1

## 2021-05-24 MED ORDER — ENSURE ENLIVE PO LIQD
237.0000 mL | Freq: Two times a day (BID) | ORAL | Status: DC
  Administered 2021-05-25 – 2021-05-27 (×4): 237 mL via ORAL

## 2021-05-24 MED ORDER — MONTELUKAST SODIUM 10 MG PO TABS: 10.0000 mg | ORAL_TABLET | Freq: Every day | ORAL | Status: DC

## 2021-05-24 MED ORDER — CEFTRIAXONE SODIUM 2 G IJ SOLR
2.0000 g | INTRAMUSCULAR | Status: DC
Start: 2021-05-24 — End: 2021-05-27
  Administered 2021-05-24 – 2021-05-26 (×3): 2 g via INTRAVENOUS
  Filled 2021-05-24: qty 2
  Filled 2021-05-24 (×3): qty 20

## 2021-05-24 MED ORDER — IPRATROPIUM-ALBUTEROL 0.5-2.5 (3) MG/3ML IN SOLN: 3.0000 mL | RESPIRATORY_TRACT | Status: DC | PRN

## 2021-05-24 MED ORDER — POLYETHYLENE GLYCOL 3350 17 G PO PACK: 17.0000 g | PACK | Freq: Every day | ORAL | Status: DC | PRN

## 2021-05-24 MED ORDER — MORPHINE SULFATE (PF) 4 MG/ML IV SOLN
4.0000 mg | INTRAVENOUS | Status: DC | PRN
  Administered 2021-05-24 – 2021-05-27 (×2): 4 mg via INTRAVENOUS
  Filled 2021-05-24 (×2): qty 1

## 2021-05-24 MED ORDER — POTASSIUM CHLORIDE CRYS ER 20 MEQ PO TBCR
20.0000 meq | EXTENDED_RELEASE_TABLET | Freq: Two times a day (BID) | ORAL | Status: DC
Start: 2021-05-24 — End: 2021-05-27
  Administered 2021-05-24 – 2021-05-27 (×6): 20 meq via ORAL
  Filled 2021-05-24 (×6): qty 1

## 2021-05-24 MED ORDER — ONDANSETRON HCL 4 MG/2ML IJ SOLN
4.0000 mg | Freq: Once | INTRAMUSCULAR | Status: AC
  Administered 2021-05-24: 4 mg via INTRAVENOUS
  Filled 2021-05-24: qty 2

## 2021-05-24 MED ORDER — LACTATED RINGERS IV SOLN: INTRAVENOUS | Status: AC

## 2021-05-24 MED ORDER — ONDANSETRON HCL 4 MG/2ML IJ SOLN: 4.0000 mg | Freq: Four times a day (QID) | INTRAMUSCULAR | Status: DC | PRN

## 2021-05-24 MED ORDER — FUROSEMIDE 40 MG PO TABS
40.0000 mg | ORAL_TABLET | Freq: Every day | ORAL | Status: DC
  Administered 2021-05-24 – 2021-05-27 (×4): 40 mg via ORAL
  Filled 2021-05-24 (×4): qty 1

## 2021-05-24 MED ORDER — HYDROCODONE-ACETAMINOPHEN 5-325 MG PO TABS
1.0000 | ORAL_TABLET | Freq: Four times a day (QID) | ORAL | Status: DC | PRN
  Administered 2021-05-24 – 2021-05-27 (×4): 1 via ORAL
  Filled 2021-05-24 (×5): qty 1

## 2021-05-24 MED ORDER — SODIUM CHLORIDE 0.9 % IV SOLN
2.0000 g | Freq: Once | INTRAVENOUS | Status: AC
  Administered 2021-05-24: 2 g via INTRAVENOUS
  Filled 2021-05-24: qty 2

## 2021-05-24 MED ORDER — ONDANSETRON HCL 4 MG PO TABS: 4.0000 mg | ORAL_TABLET | Freq: Four times a day (QID) | ORAL | Status: DC | PRN

## 2021-05-24 MED ORDER — TRAMADOL HCL 50 MG PO TABS: 50.0000 mg | ORAL_TABLET | Freq: Three times a day (TID) | ORAL | Status: DC | PRN

## 2021-05-24 MED ORDER — ALBUTEROL SULFATE (2.5 MG/3ML) 0.083% IN NEBU: 2.5000 mg | INHALATION_SOLUTION | RESPIRATORY_TRACT | Status: DC | PRN

## 2021-05-24 MED ORDER — SENNOSIDES-DOCUSATE SODIUM 8.6-50 MG PO TABS: 1.0000 | ORAL_TABLET | Freq: Every evening | ORAL | Status: DC | PRN

## 2021-05-24 NOTE — Assessment & Plan Note (Signed)
WBC is 2.9. Last WBC was 9.1 on 05/20/2021. Likely due to severe infection. Monitor for other signs of sepsis. None currently.

## 2021-05-24 NOTE — Assessment & Plan Note (Signed)
Noted. At risk for exacerbation due to pneumonia. Will make albuterol treatments available and monitor respiratory status.

## 2021-05-24 NOTE — Assessment & Plan Note (Signed)
Due to pneumonia. Hydrocodone will be made available. EKG is abnormal is 1degree AV Block and PVC's. Troponins normal at 11 and 14. Monitor on telemetry. Repeat EKG in am.

## 2021-05-24 NOTE — ED Triage Notes (Signed)
Pt comes into the ED via EMS from home with c/o cough with left lateral rib pain, SOB for the past 3 days, pt is currently under chemo for multiple myeloma, last treatment was last week. 86% on RA, Pt placed on 2L [place] in triage,

## 2021-05-24 NOTE — Assessment & Plan Note (Signed)
The patient states that she occasionally has to use O2 at home, and that sometimes she has needed as much as 4L. However, today in the ED she presented with FIO2 of 86% on room air. She is currently saturating 98% on 4L by [place].

## 2021-05-24 NOTE — Assessment & Plan Note (Signed)
Continue Synthoird at 112 mcg daily as at home.

## 2021-05-24 NOTE — Assessment & Plan Note (Addendum)
Noted. 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 She is currently on Revlimid daily for 3/4 weeks, although she was told to stop it when she became ill by her oncologist. It has been held. I would consider her to be immunosuppressed to some degree.

## 2021-05-24 NOTE — ED Notes (Signed)
Pt on 4L [place] at 91%

## 2021-05-24 NOTE — Assessment & Plan Note (Signed)
Will check Thyroid panel. Patient states that she has been taking methemizole.

## 2021-05-24 NOTE — Assessment & Plan Note (Addendum)
Sodium of 130. Baseline is between 138 and 140. Will restrict fluids.

## 2021-05-24 NOTE — Sepsis Progress Note (Signed)
[facility] is following this sepsis

## 2021-05-24 NOTE — Progress Notes (Signed)
PHARMACY -  BRIEF ANTIBIOTIC NOTE   Pharmacy has received consult(s) for cefepime and vancomycin from an ED provider.  The patient's profile has been reviewed for ht/wt/allergies/indication/available labs.    One time order(s) placed for vancomycin 1,000 mg x 1 and cefepime 2 grams x 1  Further antibiotics/pharmacy consults should be ordered by admitting physician if indicated.                       Thank you, Wynelle Cleveland, PharmD Pharmacy Resident  05/24/2021 2:43 PM

## 2021-05-24 NOTE — Progress Notes (Signed)
CODE SEPSIS - PHARMACY COMMUNICATION  **Broad Spectrum Antibiotics should be administered within 1 hour of Sepsis diagnosis**  Time Code Sepsis Called/Page Received: 1428  Antibiotics Ordered: vancomycin 1,000 mg x 1, cefepime 2 grams x 1  Time of 1st antibiotic administration: 1518  Additional action taken by pharmacy: Called RN  If necessary, Name of Provider/Nurse Contacted: RN for floor.   Wynelle Cleveland, PharmD Pharmacy Resident  05/24/2021 2:48 PM

## 2021-05-24 NOTE — ED Provider Notes (Addendum)
[facility] Emergency Department Provider Note    Event Date/Time   First MD Initiated Contact with Patient 05/24/21 1418     (approximate)  I have reviewed the triage vital signs and the nursing notes.   HISTORY  Chief Complaint Shortness of Breath and Cough    HPI Brandi [name] is a 70 y.o. female with below listed past medical history on chemotherapy for multiple myeloma presents to the ER for evaluation progressively worsening shortness of breath and productive cough.  No measured temperatures but has had some chills.  No recent antibiotics.  Complaining of moderate to severe left upper quadrant and left chest pain worsened with deep inspiration. .  Not on any blood thinners.  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[place])    Family History  Problem Relation Age of Onset   Pneumonia Mother    Seizures Father    Past Surgical History:  Procedure Laterality Date   ESOPHAGOGASTRODUODENOSCOPY (EGD) WITH PROPOFOL N/A 03/30/2019   Procedure: ESOPHAGOGASTRODUODENOSCOPY (EGD) WITH PROPOFOL;  Surgeon: Jonathon Bellows, MD;  Location: [facility] ENDOSCOPY;  Service: Gastroenterology;  Laterality: N/A;   IR FLUORO GUIDE PORT INSERTION RIGHT  12/16/2017   THYROIDECTOMY N/A    Patient Active Problem List   Diagnosis Date Noted   Lobar pneumonia ([place]) 10/10/2020   Sepsis ([place]) 10/10/2020   Chronic heart failure with  preserved ejection fraction (HFpEF) ([place]) 12/14/2019   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 Authorizing Provider  aspirin EC 81 MG EC tablet Take 1 tablet (81 mg total) by mouth daily. 01/25/20  Yes Loel Dubonnet, NP  benzonatate (TESSALON) 100 MG capsule Take 1 capsule (100 mg total) by mouth 3 (three) times daily as needed for cough. 05/22/21  Yes Borders, Kirt Boys, NP  calcium-vitamin D (OSCAL WITH D) 500MG-200UNIT (5MCG) tablet Take 1 tablet by mouth 2 (two) times daily. 05/18/21  Yes Borders, Kirt Boys, NP  dexamethasone (DECADRON) 4 MG tablet Start 2 days prior to infusion; Take for 2 days. Do not take on the day of infusion. Patient taking differently: Take 4 mg by mouth 2 (two) times daily. Start 2 days prior to infusion; Take for 2 days. Do not take on the day of infusion. 04/28/21  Yes [name], MD  ergocalciferol (VITAMIN D2) 1.25 MG (50000 UT) capsule  Take 1 capsule (50,000 Units total) by mouth once a week. 06/25/20  Yes [name], MD  Fluticasone-Salmeterol (ADVAIR DISKUS) 500-50 MCG/DOSE AEPB Inhale 1 puff into the lungs 2 (two) times daily. 10/02/19  Yes [name], MD  furosemide (LASIX) 40 MG tablet Take 1 tablet (40 mg total) by mouth daily. 11/12/19  Yes Loel Dubonnet, NP  HYDROcodone-acetaminophen (NORCO) 5-325 MG tablet Take 1 tablet by mouth every 6 (six) hours as needed for moderate pain. 05/18/21  Yes Borders, Kirt Boys, NP   levothyroxine (SYNTHROID) 112 MCG tablet Take 112 mcg by mouth daily. 03/23/21  Yes [provider]  metoprolol succinate (TOPROL-XL) 25 MG 24 hr tablet TAKE 1 & 1/2 (ONE & ONE-HALF) TABLETS BY MOUTH ONCE DAILY 02/01/21  Yes Loel Dubonnet, NP  acyclovir (ZOVIRAX) 400 MG tablet Take 1 tablet (400 mg total) by mouth 2 (two) times daily. 04/28/21   [name], MD  albuterol (VENTOLIN HFA) 108 (90 Base) MCG/ACT inhaler Inhale 2 puffs into the lungs every 6 (six) hours as needed for wheezing or shortness of breath. 04/28/21   [name], MD  buPROPion (WELLBUTRIN) 75 MG tablet Take 75 mg by mouth daily. 03/24/21   [provider]  chlorpheniramine-HYDROcodone (TUSSIONEX) 10-8 MG/5ML SUER Take 5 mLs by mouth every 12 (twelve) hours as needed for cough. Patient not taking: Reported on 05/24/2021 05/22/21   [name], MD  feeding supplement, ENSURE ENLIVE, (ENSURE ENLIVE) LIQD Take 237 mLs by mouth 2 (two) times daily between meals. 04/12/17   Nicholes Mango, MD  Ipratropium-Albuterol (COMBIVENT) 20-100 MCG/ACT AERS respimat Inhale into the lungs. 03/13/20 05/15/21  [provider]  ipratropium-albuterol (DUONEB) 0.5-2.5 (3) MG/3ML SOLN Take 3 mLs by nebulization every 4 (four) hours as needed. Patient not taking: Reported on 05/24/2021 05/24/20   Hinda Kehr, MD  lenalidomide (REVLIMID) 15 MG capsule Take 1 capsule (15 mg total) by mouth daily. 3 weeks on and 1 week off 04/28/21   [name], MD  mirtazapine (REMERON) 15 MG tablet Take 1 tablet (15 mg total) by mouth at bedtime. Patient not taking: Reported on 05/24/2021 05/28/20   [name], MD  montelukast (SINGULAIR) 10 MG tablet Take 1 tablet (10 mg total) by mouth at bedtime. START 2 days prior to chemo infusion. Take For 2 days; Do NOT take on the day of infusion. Patient not taking: Reported on 05/24/2021 04/28/21   [name], MD  ondansetron (ZOFRAN) 8 MG  tablet One pill every 8 hours as needed for nausea/vomitting. 04/28/21   [name], MD  pantoprazole (PROTONIX) 40 MG tablet Take 1 tablet (40 mg total) by mouth daily. 10/27/20   [name], MD  polyethylene glycol (MIRALAX / GLYCOLAX) packet Take 17 g by mouth daily as needed for mild constipation. 04/11/17   Gouru, Illene Silver, MD  potassium chloride SA (KLOR-CON) 20 MEQ tablet Take 1 tablet (20 mEq total) by mouth 2 (two) times daily. 04/28/21   [name], MD  prochlorperazine (COMPAZINE) 10 MG tablet Take 1 tablet (10 mg total) by mouth every 6 (six) hours as needed for nausea or vomiting. 04/28/21   [name], MD  spironolactone (ALDACTONE) 25 MG tablet Take 1 tablet by mouth once daily 05/11/21   Marrianne Mood D, PA-C    Allergies Patient has no known allergies.    Social History Social History   Tobacco Use   Smoking status: Every Day  Packs/day: 0.50    Types: Cigarettes   Smokeless tobacco: Never   Tobacco comments:    2 cigarettes QOD  Vaping Use   Vaping Use: Never used  Substance Use Topics   Alcohol use: No   Drug use: No    Review of Systems Patient denies headaches, rhinorrhea, blurry vision, numbness, shortness of breath, chest pain, edema, cough, abdominal pain, nausea, vomiting, diarrhea, dysuria, fevers, rashes or hallucinations unless otherwise stated above in HPI. ____________________________________________   PHYSICAL EXAM:  VITAL SIGNS: Vitals:   05/24/21 1321 05/24/21 1518  BP: (!) 158/82 (!) 149/78  Pulse: 94 85  Resp: 20 (!) 24  Temp: 99.7 F (37.6 C)   SpO2: (!) 86% 98%    Constitutional: Alert and oriented.  Eyes: Conjunctivae are normal.  Head: Atraumatic. Nose: No congestion/rhinnorhea. Mouth/Throat: Mucous membranes are moist.   Neck: No stridor. Painless ROM.  Cardiovascular: Normal rate, regular rhythm. Grossly normal heart sounds.  Good peripheral circulation. Respiratory: mild  tachypnea, coarse bilat bs with rhonchi in left lung base Gastrointestinal: Soft and nontender. No distention. No abdominal bruits. No CVA tenderness. Genitourinary:  Musculoskeletal: No lower extremity tenderness nor edema.  No joint effusions. Neurologic:  Normal speech and language. No gross focal neurologic deficits are appreciated. No facial droop Skin:  Skin is warm, dry and intact. No rash noted. Psychiatric: Mood and affect are normal. Speech and behavior are normal.  ____________________________________________   LABS (all labs ordered are listed, but only abnormal results are displayed)  Results for orders placed or performed during the hospital encounter of 05/24/21 (from the past 24 hour(s))  CBC with Differential     Status: Abnormal   Collection Time: 05/24/21  1:23 PM  Result Value Ref Range   WBC 2.9 (L) 4.0 - 10.5 K/uL   RBC 4.18 3.87 - 5.11 MIL/uL   Hemoglobin 11.5 (L) 12.0 - 15.0 g/dL   HCT 35.7 (L) 36.0 - 46.0 %   MCV 85.4 80.0 - 100.0 fL   MCH 27.5 26.0 - 34.0 pg   MCHC 32.2 30.0 - 36.0 g/dL   RDW 16.1 (H) 11.5 - 15.5 %   Platelets 255 150 - 400 K/uL   nRBC 0.0 0.0 - 0.2 %   Neutrophils Relative % 53 %   Neutro Abs 1.5 (L) 1.7 - 7.7 K/uL   Lymphocytes Relative 38 %   Lymphs Abs 1.1 0.7 - 4.0 K/uL   Monocytes Relative 8 %   Monocytes Absolute 0.2 0.1 - 1.0 K/uL   Eosinophils Relative 0 %   Eosinophils Absolute 0.0 0.0 - 0.5 K/uL   Basophils Relative 1 %   Basophils Absolute 0.0 0.0 - 0.1 K/uL   Immature Granulocytes 0 %   Abs Immature Granulocytes 0.01 0.00 - 0.07 K/uL  Comprehensive metabolic panel     Status: Abnormal   Collection Time: 05/24/21  1:23 PM  Result Value Ref Range   Sodium 130 (L) 135 - 145 mmol/L   Potassium 3.5 3.5 - 5.1 mmol/L   Chloride 96 (L) 98 - 111 mmol/L   CO2 22 22 - 32 mmol/L   Glucose, Bld 112 (H) 70 - 99 mg/dL   BUN 16 8 - 23 mg/dL   Creatinine, Ser 0.99 0.44 - 1.00 mg/dL   Calcium 7.3 (L) 8.9 - 10.3 mg/dL   Total  Protein 8.6 (H) 6.5 - 8.1 g/dL   Albumin 3.8 3.5 - 5.0 g/dL   AST 24 15 - 41 U/L   ALT  15 0 - 44 U/L   Alkaline Phosphatase 98 38 - 126 U/L   Total Bilirubin 1.4 (H) 0.3 - 1.2 mg/dL   GFR, Estimated >60 >60 mL/min   Anion gap 12 5 - 15  Troponin I (High Sensitivity)     Status: None   Collection Time: 05/24/21  1:23 PM  Result Value Ref Range   Troponin I (High Sensitivity) 11 <18 ng/L  Brain natriuretic peptide     Status: Abnormal   Collection Time: 05/24/21  1:23 PM  Result Value Ref Range   B Natriuretic Peptide 123.0 (H) 0.0 - 100.0 pg/mL  Lactic acid, plasma     Status: None   Collection Time: 05/24/21  2:32 PM  Result Value Ref Range   Lactic Acid, Venous 1.9 0.5 - 1.9 mmol/L  Resp Panel by RT-PCR (Flu A&B, Covid) Nasopharyngeal Swab     Status: None   Collection Time: 05/24/21  2:32 PM   Specimen: Nasopharyngeal Swab; Nasopharyngeal(NP) swabs in vial transport medium  Result Value Ref Range   SARS Coronavirus 2 by RT PCR NEGATIVE NEGATIVE   Influenza A by PCR NEGATIVE NEGATIVE   Influenza B by PCR NEGATIVE NEGATIVE   ____________________________________________  EKG My review and personal interpretation at Time: 13:33   Indication: sob  Rate: 90  Rhythm: sinus Axis: normal Other: limited 2/2 artifact, no clear stemi, abnml ekg ____________________________________________  RADIOLOGY  I personally reviewed all radiographic images ordered to evaluate for the above acute complaints and reviewed radiology reports and findings.  These findings were personally discussed with the patient.  Please see medical record for radiology report.  ____________________________________________   PROCEDURES  Procedure(s) performed:  .Critical Care Performed by: Merlyn Lot, MD Authorized by: Merlyn Lot, MD   Critical care provider statement:    Critical care time (minutes):  35   Critical care was necessary to treat or prevent imminent or life-threatening  deterioration of the following conditions:  Respiratory failure   Critical care was time spent personally by me on the following activities:  Development of treatment plan with patient or surrogate, discussions with consultants, evaluation of patient's response to treatment, examination of patient, obtaining history from patient or surrogate, ordering and performing treatments and interventions, ordering and review of laboratory studies, ordering and review of radiographic studies, pulse oximetry, re-evaluation of patient's condition and review of old charts    Critical Care performed: no ____________________________________________   INITIAL IMPRESSION / ASSESSMENT AND PLAN / ED COURSE  Pertinent labs & imaging results that were available during my care of the patient were reviewed by me and considered in my medical decision making (see chart for details).   DDX: pna, ptx, chf, pe, mass, copd, bronchitis, sepsis  Brandi [name] is a 70 y.o. who presents to the ED with symptoms as described above.  Patient with low-grade temperatures she does have evidence of acute hypoxia requiring supplemental oxygen.  She is tachypneic but protecting her airway nebulizers ordered.  Blood work sent for the above differential given her low-grade temperature concern for possible sepsis.  She is normotensive we will hold off on IV fluids until lactate returned.  Clinical Course as of 05/24/21 1551  Sun May 24, 2021  1538 I do not appreciate any large PE on my review of CT.  Does have what appears to be a consolidation left lung base.  Given her leukopenia low-grade temperature cough and shortness of breath has received broad-spectrum antibiotics for pneumonia.  We will continue  nebulizers and treatment for COPD exacerbation.  Will discuss case with hospitalist for admission.  Patient agreeable to plan. [PR]    Clinical Course User Index [PR] Merlyn Lot, MD    The patient was evaluated in Emergency  Department today for the symptoms described in the history of present illness. He/she was evaluated in the context of the global COVID-19 pandemic, which necessitated consideration that the patient might be at risk for infection with the SARS-CoV-2 virus that causes COVID-19. Institutional protocols and algorithms that pertain to the evaluation of patients at risk for COVID-19 are in a state of rapid change based on information released by regulatory bodies including the CDC and federal and state organizations. These policies and algorithms were followed during the patient's care in the ED.  As part of my medical decision making, I reviewed the following data within the [place] notes reviewed and incorporated, Labs reviewed, notes from prior ED visits and [place] Controlled Substance Database   ____________________________________________   FINAL CLINICAL IMPRESSION(S) / ED DIAGNOSES  Final diagnoses:  Acute on chronic respiratory failure with hypoxia (HCC)  COPD exacerbation ([place])      NEW MEDICATIONS STARTED DURING THIS VISIT:  New Prescriptions   No medications on file     Note:  This document was prepared using Dragon voice recognition software and may include unintentional dictation errors.    Merlyn Lot, MD 05/24/21 1540    Merlyn Lot, MD 05/24/21 [phone]

## 2021-05-24 NOTE — Assessment & Plan Note (Signed)
Consolidation of the left lower lobe. Most likely community acquired pneumonia. The patient has been placed on IV Rocephin and Azithromycin. She is being tested for legionella and strep pneumonia as well.

## 2021-05-24 NOTE — ED Notes (Signed)
Patient transported to CT 

## 2021-05-24 NOTE — Assessment & Plan Note (Signed)
In the ED the patient demonstrated RR of 24, a drop in presenting blood pressure of 159/82 - 108/89, Room air O2 saturations of 86%, and a drop of Saturations from 97% on 4L to 92% on 4L, WBC is 2.9, and there is a left lower lobe consolidatioin on imagery.

## 2021-05-24 NOTE — Assessment & Plan Note (Signed)
Patient with left lower lobe consolidation on CT chest per ED physician. The patient has been started on IV Rocephin and IV azithromycin. Blood cultures x 2 have been obtained. She will be evaluated for legionella and strep pneumoniae as well. She will also have mucinex scheduled and nebulizer treatments available as needed.

## 2021-05-24 NOTE — ED Provider Notes (Signed)
Emergency Medicine Provider Triage Evaluation Note  Brandi [name] , a 70 y.o. female  was evaluated in triage.  Pt complains of nasal congestion, fever, shortness of breath.  Patient states symptoms x3 to 4 days.  She denies any headache, visual changes, sore throat, chest pain, cough, abdominal pain, vomiting, urinary changes.  Patient is a cancer patient, concern for fever in a neutropenic patient.  Patient was hypoxic on room air on arrival..  Review of Systems  Positive: Shortness of breath, congestion, fever.   Negative: Chest pain, headache, neck pain or stiffness, abdominal pain, emesis, diarrhea, urinary changes  Physical Exam  BP (!) 158/82 (BP Location: Right Arm)   Pulse 94   Temp 99.7 F (37.6 C) (Oral)   Resp 20   Ht 5\' 2"  (1.575 m)   Wt 77.1 kg   SpO2 (!) 86%   BMI 31.09 kg/m  Gen:   Awake, no distress.  Ill-appearing Resp:  Normal effort.  Patient was hypoxic on room air.  On 4 L patient has improved into the mid 90 percentile. MSK:   Moves extremities without difficulty  Other:    Medical Decision Making  Medically screening exam initiated at 1:30 PM.  Appropriate orders placed.  Brandi [name] was informed that the remainder of the evaluation will be completed by another provider, this initial triage assessment does not replace that evaluation, and the importance of remaining in the ED until their evaluation is complete.  Patient presents with fever, congestion, shortness of breath.  Was hypoxic on room air.  Patient is a cancer patient, neutropenic with fever.  Patient will have labs, chest x-ray, urinalysis COVID swab   Darletta Moll, PA-C 05/24/21 1337    Blake Divine, MD 05/24/21 [phone]

## 2021-05-24 NOTE — H&P (Signed)
Brandi [name] is an 70 y.o. female.   Chief Complaint: Nasal congestion, fever, and progressive shortness of breath x 3-4 days, left sided chest pain, and productive cough. HPI: The patient is a 70 yr old woman who present with the above complaints from home.   She carries a medical history of MM for which she has received a BMT which initially put her into transmission, although recently it seems that it is "trying to come back" in her words. She is on Revlimid daily 3 weeks/4, although when she became ill oncology directed her to stop it. She also has CHF, COPD, IgA myeloma, Hypothyroidism, Difficult to obtain appropriate blood products due to the patient's multiple difficult antibodies, and hypertension.   In the ED she was found to have a left lower lobe consolidate. Her WBC is low at 2.9 which is likely a sign of impending sepsis. Sodium is low at 130, and T Bili is elevated at 1.4 as an acute phase reactant. She is hypoxic at 86% on room air. The patient states that she occasionally requires O2 at home, and sometimes as much as 4l, but states that that is unusual. This definitally represents an increased FIO2 requirement. O2 saturations have continued to drop throughout her stay in the ED.   [facility] were consulted to admit the patient for further evaluation and admission.  Past Medical History:  Diagnosis Date   Anxiety    At risk for cardiac dysfunction    CHF (congestive heart failure) (HCC)    COPD (chronic obstructive pulmonary disease) (HCC)    Hypertension    Hyperthyroidism    Hypokalemia    IgA myeloma (HCC)    Multiple myeloma (HCC)    Pneumonia    Red blood cell antibody positive, compatible PRBC difficult to obtain    S/P autologous bone marrow transplantation [facility])     Past Surgical History:  Procedure Laterality Date   ESOPHAGOGASTRODUODENOSCOPY (EGD) WITH PROPOFOL N/A 03/30/2019   Procedure: ESOPHAGOGASTRODUODENOSCOPY (EGD) WITH PROPOFOL;  Surgeon:  Jonathon Bellows, MD;  Location: [facility] ENDOSCOPY;  Service: Gastroenterology;  Laterality: N/A;   IR FLUORO GUIDE PORT INSERTION RIGHT  12/16/2017   THYROIDECTOMY N/A     Family History  Problem Relation Age of Onset   Pneumonia Mother    Seizures Father    Social History:  reports that she has been smoking cigarettes. She has been smoking an average of .5 packs per day. She has never used smokeless tobacco. She reports that she does not drink alcohol and does not use drugs. (Not in a hospital admission)   Allergies: No Known Allergies  ROS: Twelve systems were reviewed with patient and were found to be negative with exception to those elements included in the HPI above.   General appearance: alert, cooperative, appears stated age, fatigued, mild distress, and morbidly obese Head: Normocephalic, without obvious abnormality, atraumatic Eyes: conjunctivae/corneas clear. PERRL, EOM's intact. Fundi benign. Throat: lips, mucosa, and tongue normal; teeth and gums normal Neck: no adenopathy, no carotid bruit, no JVD, supple, symmetrical, trachea midline, and thyroid not enlarged, symmetric, no tenderness/mass/nodules Resp:  Lung sounds are distant. No wheezes, rales, or rhonchi were auscultated. Positive for increased work of breathing with tachypnea and accessory muscle use. No tactile fremitus. Chest wall: right sided chest wall tenderness, left sided chest wall tenderness Cardio: regular rate and rhythm, S1, S2 normal, no murmur, click, rub or gallop GI: soft, non-tender; bowel sounds normal; no masses,  no organomegaly and morbidly obese.  Extremities: edema 2-3+, no cyanosis or clubbing. Pulses: 2+ and symmetric Skin: Skin color, texture, turgor normal. No rashes or lesions Lymph nodes: Cervical, supraclavicular, and axillary nodes normal. Neurologic: Alert and oriented X 3, normal strength and tone. Normal symmetric reflexes. Normal coordination and gait Incision/Wound: None noted.  Results  for orders placed or performed during the hospital encounter of 05/24/21 (from the past 48 hour(s))  CBC with Differential     Status: Abnormal   Collection Time: 05/24/21  1:23 PM  Result Value Ref Range   WBC 2.9 (L) 4.0 - 10.5 K/uL   RBC 4.18 3.87 - 5.11 MIL/uL   Hemoglobin 11.5 (L) 12.0 - 15.0 g/dL   HCT 35.7 (L) 36.0 - 46.0 %   MCV 85.4 80.0 - 100.0 fL   MCH 27.5 26.0 - 34.0 pg   MCHC 32.2 30.0 - 36.0 g/dL   RDW 16.1 (H) 11.5 - 15.5 %   Platelets 255 150 - 400 K/uL   nRBC 0.0 0.0 - 0.2 %   Neutrophils Relative % 53 %   Neutro Abs 1.5 (L) 1.7 - 7.7 K/uL   Lymphocytes Relative 38 %   Lymphs Abs 1.1 0.7 - 4.0 K/uL   Monocytes Relative 8 %   Monocytes Absolute 0.2 0.1 - 1.0 K/uL   Eosinophils Relative 0 %   Eosinophils Absolute 0.0 0.0 - 0.5 K/uL   Basophils Relative 1 %   Basophils Absolute 0.0 0.0 - 0.1 K/uL   Immature Granulocytes 0 %   Abs Immature Granulocytes 0.01 0.00 - 0.07 K/uL    Comment: Performed at [facility], [address] [place], [place] [address]  Comprehensive metabolic panel     Status: Abnormal   Collection Time: 05/24/21  1:23 PM  Result Value Ref Range   Sodium 130 (L) 135 - 145 mmol/L   Potassium 3.5 3.5 - 5.1 mmol/L   Chloride 96 (L) 98 - 111 mmol/L   CO2 22 22 - 32 mmol/L   Glucose, Bld 112 (H) 70 - 99 mg/dL    Comment: Glucose reference range applies only to samples taken after fasting for at least 8 hours.   BUN 16 8 - 23 mg/dL   Creatinine, Ser 0.99 0.44 - 1.00 mg/dL   Calcium 7.3 (L) 8.9 - 10.3 mg/dL   Total Protein 8.6 (H) 6.5 - 8.1 g/dL   Albumin 3.8 3.5 - 5.0 g/dL   AST 24 15 - 41 U/L   ALT 15 0 - 44 U/L   Alkaline Phosphatase 98 38 - 126 U/L   Total Bilirubin 1.4 (H) 0.3 - 1.2 mg/dL   GFR, Estimated >60 >60 mL/min    Comment: (NOTE) Calculated using the CKD-EPI Creatinine Equation (2021)    Anion gap 12 5 - 15    Comment: Performed at [facility], [place], [place] [address]  Troponin I (High  Sensitivity)     Status: None   Collection Time: 05/24/21  1:23 PM  Result Value Ref Range   Troponin I (High Sensitivity) 11 <18 ng/L    Comment: (NOTE) Elevated high sensitivity troponin I (hsTnI) values and significant  changes across serial measurements may suggest ACS but many other  chronic and acute conditions are known to elevate hsTnI results.  Refer to the "Links" section for chest pain algorithms and additional  guidance. Performed at [facility], [address], [place], [place] [address]   Brain natriuretic peptide     Status: Abnormal  Collection Time: 05/24/21  1:23 PM  Result Value Ref Range   B Natriuretic Peptide 123.0 (H) 0.0 - 100.0 pg/mL    Comment: Performed at [facility], [address] [place], [place] [address]  Lactic acid, plasma     Status: None   Collection Time: 05/24/21  2:32 PM  Result Value Ref Range   Lactic Acid, Venous 1.9 0.5 - 1.9 mmol/L    Comment: Performed at [facility], [address], [place], [place] [address]  Resp Panel by RT-PCR (Flu A&B, Covid) Nasopharyngeal Swab     Status: None   Collection Time: 05/24/21  2:32 PM   Specimen: Nasopharyngeal Swab; Nasopharyngeal(NP) swabs in vial transport medium  Result Value Ref Range   SARS Coronavirus 2 by RT PCR NEGATIVE NEGATIVE    Comment: (NOTE) SARS-CoV-2 target nucleic acids are NOT DETECTED.  The SARS-CoV-2 RNA is generally detectable in upper respiratory specimens during the acute phase of infection. The lowest concentration of SARS-CoV-2 viral copies this assay can detect is 138 copies/mL. A negative result does not preclude SARS-Cov-2 infection and should not be used as the sole basis for treatment or other patient management decisions. A negative result may occur with  improper specimen collection/handling, submission of specimen other than nasopharyngeal swab, presence of viral mutation(s) within the areas targeted by this assay, and  inadequate number of viral copies(<138 copies/mL). A negative result must be combined with clinical observations, patient history, and epidemiological information. The expected result is Negative.  Fact Sheet for Patients:  EntrepreneurPulse.com.au  Fact Sheet for Healthcare Providers:  IncredibleEmployment.be  This test is no t yet approved or cleared by the [place] FDA and  has been authorized for detection and/or diagnosis of SARS-CoV-2 by FDA under an Emergency Use Authorization (EUA). This EUA will remain  in effect (meaning this test can be used) for the duration of the COVID-19 declaration under Section 564(b)(1) of the Act, 21 U.S.C.section 360bbb-3(b)(1), unless the authorization is terminated  or revoked sooner.       Influenza A by PCR NEGATIVE NEGATIVE   Influenza B by PCR NEGATIVE NEGATIVE    Comment: (NOTE) The Xpert Xpress SARS-CoV-2/FLU/RSV plus assay is intended as an aid in the diagnosis of influenza from Nasopharyngeal swab specimens and should not be used as a sole basis for treatment. Nasal washings and aspirates are unacceptable for Xpert Xpress SARS-CoV-2/FLU/RSV testing.  Fact Sheet for Patients: EntrepreneurPulse.com.au  Fact Sheet for Healthcare Providers: IncredibleEmployment.be  This test is not yet approved or cleared by the [place] FDA and has been authorized for detection and/or diagnosis of SARS-CoV-2 by FDA under an Emergency Use Authorization (EUA). This EUA will remain in effect (meaning this test can be used) for the duration of the COVID-19 declaration under Section 564(b)(1) of the Act, 21 U.S.C. section 360bbb-3(b)(1), unless the authorization is terminated or revoked.  Performed at [facility], [address] [place], [place] [address]   Lactic acid, plasma     Status: None   Collection Time: 05/24/21  5:28 PM  Result Value Ref Range    Lactic Acid, Venous 1.7 0.5 - 1.9 mmol/L    Comment: Performed at [facility], [place], [place] [address]  Troponin I (High Sensitivity)     Status: None   Collection Time: 05/24/21  5:28 PM  Result Value Ref Range   Troponin I (High Sensitivity) 14 <18 ng/L    Comment: (NOTE) Elevated high sensitivity troponin  I (hsTnI) values and significant  changes across serial measurements may suggest ACS but many other  chronic and acute conditions are known to elevate hsTnI results.  Refer to the "Links" section for chest pain algorithms and additional  guidance. Performed at [facility], [address] [place], [place] [address]    _0 @  Blood pressure 108/89, pulse 96, temperature 99.7 F (37.6 C), temperature source Oral, resp. rate (!) 24, height _1  (1.575 m), weight 77.1 kg, SpO2 92 %.    Assessment/Plan Acute on chronic respiratory failure with hypoxia [facility]) The patient states that she occasionally has to use O2 at home, and that sometimes she has needed as much as 4L. However, today in the ED she presented with FIO2 of 86% on room air. She is currently saturating 98% on 4L by [place].  Hyponatremia Sodium of 130. Baseline is between 138 and 140. Will restrict fluids.   Leukopenia WBC is 2.9. Last WBC was 9.1 on 05/20/2021. Likely due to severe infection. Monitor for other signs of sepsis. None currently.  Lobar pneumonia [facility]) Patient with left lower lobe consolidation on CT chest per ED physician. The patient has been started on IV Rocephin and IV azithromycin. Blood cultures x 2 have been obtained. She will be evaluated for legionella and strep pneumoniae as well. She will also have mucinex scheduled and nebulizer treatments available as needed.  Multiple myeloma (HCC) Noted. 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 She is currently on Revlimid daily  for 3/4 weeks, although she was told to stop it when she became ill by her oncologist. It has been held. I would consider her to be immunosuppressed to some degree.  Pleuritic chest pain Due to pneumonia. Hydrocodone will be made available. EKG is abnormal is 1degree AV Block and PVC's. Troponins normal at 11 and 14. Monitor on telemetry. Repeat EKG in am.   Hyperthyroidism Will check Thyroid panel. Patient states that she has been taking methemizole.  Hypothyroidism Continue Synthoird at 112 mcg daily as at home.  COPD (chronic obstructive pulmonary disease) ([place]) Noted. At risk for exacerbation due to pneumonia. Will make albuterol treatments available and monitor respiratory status.  Pneumonia Consolidation of the left lower lobe. Most likely community acquired pneumonia. The patient has been placed on IV Rocephin and Azithromycin. She is being tested for legionella and strep pneumonia as well.   Sepsis ([place]) In the ED the patient demonstrated RR of 24, a drop in presenting blood pressure of 159/82 - 108/89, Room air O2 saturations of 86%, and a drop of Saturations from 97% on 4L to 92% on 4L, WBC is 2.9, and there is a left lower lobe consolidatioin on imagery.   I have seen and examined this patient myself. I have spent 82 minutes in her evaluation and care.  Zakee Deerman 05/24/2021, 8:32 PM

## 2021-05-25 ENCOUNTER — Other Ambulatory Visit: Payer: Self-pay | Admitting: *Deleted

## 2021-05-25 ENCOUNTER — Telehealth: Payer: Self-pay | Admitting: Internal Medicine

## 2021-05-25 ENCOUNTER — Telehealth: Payer: Self-pay | Admitting: *Deleted

## 2021-05-25 DIAGNOSIS — R0781 Pleurodynia: Secondary | ICD-10-CM

## 2021-05-25 DIAGNOSIS — C9001 Multiple myeloma in remission: Secondary | ICD-10-CM

## 2021-05-25 DIAGNOSIS — J441 Chronic obstructive pulmonary disease with (acute) exacerbation: Principal | ICD-10-CM

## 2021-05-25 DIAGNOSIS — E038 Other specified hypothyroidism: Secondary | ICD-10-CM

## 2021-05-25 DIAGNOSIS — E871 Hypo-osmolality and hyponatremia: Secondary | ICD-10-CM

## 2021-05-25 LAB — URINALYSIS, ROUTINE W REFLEX MICROSCOPIC
Bacteria, UA: NONE SEEN
Bilirubin Urine: NEGATIVE
Glucose, UA: NEGATIVE mg/dL
Ketones, ur: NEGATIVE mg/dL
Leukocytes,Ua: NEGATIVE
Nitrite: NEGATIVE
Protein, ur: 30 mg/dL — AB
Specific Gravity, Urine: 1.008 (ref 1.005–1.030)
pH: 5 (ref 5.0–8.0)

## 2021-05-25 LAB — CBC
HCT: 32 % — ABNORMAL LOW (ref 36.0–46.0)
Hemoglobin: 10.4 g/dL — ABNORMAL LOW (ref 12.0–15.0)
MCH: 28.2 pg (ref 26.0–34.0)
MCHC: 32.5 g/dL (ref 30.0–36.0)
MCV: 86.7 fL (ref 80.0–100.0)
Platelets: 231 10*3/uL (ref 150–400)
RBC: 3.69 MIL/uL — ABNORMAL LOW (ref 3.87–5.11)
RDW: 15.9 % — ABNORMAL HIGH (ref 11.5–15.5)
WBC: 3.3 10*3/uL — ABNORMAL LOW (ref 4.0–10.5)
nRBC: 0 % (ref 0.0–0.2)

## 2021-05-25 LAB — PROTIME-INR
INR: 1.2 (ref 0.8–1.2)
Prothrombin Time: 15.2 seconds (ref 11.4–15.2)

## 2021-05-25 LAB — COMPREHENSIVE METABOLIC PANEL
ALT: 14 U/L (ref 0–44)
AST: 23 U/L (ref 15–41)
Albumin: 3.2 g/dL — ABNORMAL LOW (ref 3.5–5.0)
Alkaline Phosphatase: 84 U/L (ref 38–126)
Anion gap: 10 (ref 5–15)
BUN: 19 mg/dL (ref 8–23)
CO2: 23 mmol/L (ref 22–32)
Calcium: 7.1 mg/dL — ABNORMAL LOW (ref 8.9–10.3)
Chloride: 100 mmol/L (ref 98–111)
Creatinine, Ser: 0.96 mg/dL (ref 0.44–1.00)
GFR, Estimated: >60 mL/min (ref >60)
Glucose, Bld: 118 mg/dL — ABNORMAL HIGH (ref 70–99)
Potassium: 3.8 mmol/L (ref 3.5–5.1)
Sodium: 133 mmol/L — ABNORMAL LOW (ref 135–145)
Total Bilirubin: 0.8 mg/dL (ref 0.3–1.2)
Total Protein: 7.8 g/dL (ref 6.5–8.1)

## 2021-05-25 LAB — STREP PNEUMONIAE URINARY ANTIGEN: Strep Pneumo Urinary Antigen: NEGATIVE

## 2021-05-25 LAB — HIV ANTIBODY (ROUTINE TESTING W REFLEX): HIV Screen 4th Generation wRfx: NONREACTIVE

## 2021-05-25 MED ORDER — GUAIFENESIN-CODEINE 100-10 MG/5ML PO SOLN
10.0000 mL | Freq: Four times a day (QID) | ORAL | Status: DC | PRN
  Administered 2021-05-25 – 2021-05-27 (×3): 10 mL via ORAL
  Filled 2021-05-25 (×3): qty 10

## 2021-05-25 MED ORDER — IPRATROPIUM-ALBUTEROL 0.5-2.5 (3) MG/3ML IN SOLN
3.0000 mL | Freq: Four times a day (QID) | RESPIRATORY_TRACT | Status: DC
  Administered 2021-05-25 – 2021-05-27 (×7): 3 mL via RESPIRATORY_TRACT
  Filled 2021-05-25 (×7): qty 3

## 2021-05-25 MED ORDER — METHYLPREDNISOLONE SODIUM SUCC 125 MG IJ SOLR
80.0000 mg | Freq: Every day | INTRAMUSCULAR | Status: DC
  Administered 2021-05-25 – 2021-05-27 (×3): 80 mg via INTRAVENOUS
  Filled 2021-05-25 (×3): qty 2

## 2021-05-25 NOTE — Progress Notes (Signed)
PROGRESS NOTE    Brandi [name]  AXK:[id number] DOB: November 12, 1950 DOA: 05/24/2021 PCP: [facility] [employer]   Brief Narrative:  45 yr BF PMHx MM for which she has received a BMT which initially put her into transmission, although recently it seems that it is "trying to come back" in her words. She is on Revlimid daily 3 weeks/4, although when she became ill oncology directed her to stop it. She also has CHF, COPD, IgA myeloma, Hypothyroidism, Difficult to obtain appropriate blood products due to the patient's multiple difficult antibodies, and HTN, hypothyroidism,  In the ED she was found to have a left lower lobe consolidate. Her WBC is low at 2.9 which is likely a sign of impending sepsis. Sodium is low at 130, and T Bili is elevated at 1.4 as an acute phase reactant. She is hypoxic at 86% on room air. The patient states that she occasionally requires O2 at home, and sometimes as much as 4l, but states that that is unusual. This definitally represents an increased FIO2 requirement. O2 saturations have continued to drop throughout her stay in the ED.    [facility] were consulted to admit the patient for further evaluation and admission.   Subjective: A/O x4, positive productive cough clear.   Assessment & Plan:  Covid vaccination;  Principal Problem:   Acute on chronic respiratory failure with hypoxia (HCC) Active Problems:   Multiple myeloma (HCC)   Lobar pneumonia (HCC)   Sepsis ([place])   Pleuritic chest pain   Hyponatremia   Leukopenia   Pneumonia   Hypothyroidism   COPD (chronic obstructive pulmonary disease) ([place])  Sepsis ([place]) -On admission RR of 24, a drop in presenting blood pressure of 159/82 - 108/89, Room air O2 saturations of 86%, and a drop of Saturations from 97% on 4L to 92% on 4L, WBC is 2.9,  Acute on chronic respiratory failure with hypoxia (HCC) -occasionally has to use O2 at home, and that sometimes she has needed as much as 4L.   -Presented to ED FIO2 of 86% on room air. She is currently saturating 98% on 4L by [place]. -Solu-Medrol 80 mg daily - DuoNeb QID -Flutter valve - Incentive spirometry -Complete 7-day course antibiotics -Sputum pending  COPD (chronic obstructive pulmonary disease) ([place]) -See respiratory failure    CAP LLL -Consolidation of the left lower lobe. Most likely community acquired pneumonia.  -IV Rocephin and Azithromycin.  -Legionella and strep pneumonia as well.    Hyponatremia ( Baseline 138 and 140) Lab Results  Component Value Date   NA 133 (L) 05/25/2021   NA 130 (L) 05/24/2021   NA 139 05/20/2021   NA 132 (L) 05/18/2021   NA 138 05/12/2021  -Improving  Leukopenia -WBC is 2.9. Last WBC was 9.1 on 05/20/2021. Likely due to severe infection. Monitor for other signs of sepsis. None currently.    Multiple myeloma (HCC) Noted. 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 She is currently on Revlimid daily for 3/4 weeks, although she was told to stop it when she became ill by her oncologist. It has been held. I would consider her to be immunosuppressed to some degree.   Pleuritic chest pain -Due to pneumonia. Hydrocodone will be made available.  -EKG is abnormal is 1degree AV Block and PVC's. Troponins normal at 11 and 14. Monitor on telemetry.    Hyperthyroidism -Patient states that she has been taking  methemizole.  -Will check Thyroid panel. Hypothyroidism -Continue Synthoird at 112 mcg daily as at home.  LEFT rectus abdominis Hematoma - Hold all anticoagulation     Obese (BMI 31.09 kg/m)     DVT prophylaxis: SCD Code Status: Full Family Communication:  Status is: Inpatient    Dispo: The patient is from: Home              Anticipated d/c is to: Home              Anticipated d/c date is: > 3 days              Patient currently is not medically stable to d/c.      Consultants:     Procedures/Significant Events:     I have personally reviewed and interpreted all radiology studies and my findings are as above.  VENTILATOR SETTINGS:    Cultures   Antimicrobials: Anti-infectives (From admission, onward)    Start     Dose/Rate Route Frequency Ordered Stop   05/25/21 1530  azithromycin (ZITHROMAX) 500 mg in sodium chloride 0.9 % 250 mL IVPB        500 mg 250 mL/hr over 60 Minutes Intravenous Every 24 hours 05/24/21 1646 05/30/21 1529   05/24/21 2300  cefTRIAXone (ROCEPHIN) 2 g in sodium chloride 0.9 % 100 mL IVPB        2 g 200 mL/hr over 30 Minutes Intravenous Every 24 hours 05/24/21 1646 05/29/21 2259   05/24/21 2200  acyclovir (ZOVIRAX) 200 MG capsule 400 mg        400 mg Oral 2 times daily 05/24/21 1642     05/24/21 1430  vancomycin (VANCOCIN) IVPB 1000 mg/200 mL premix        1,000 mg 200 mL/hr over 60 Minutes Intravenous  Once 05/24/21 1421 05/24/21 1820   05/24/21 1430  ceFEPIme (MAXIPIME) 2 g in sodium chloride 0.9 % 100 mL IVPB        2 g 200 mL/hr over 30 Minutes Intravenous  Once 05/24/21 1421 05/24/21 1612   05/24/21 1430  azithromycin (ZITHROMAX) 500 mg in sodium chloride 0.9 % 250 mL IVPB        500 mg 250 mL/hr over 60 Minutes Intravenous  Once 05/24/21 1421 05/24/21 1709         Devices    LINES / TUBES:      Continuous Infusions:  azithromycin     cefTRIAXone (ROCEPHIN)  IV Stopped (05/24/21 2355)   lactated ringers 150 mL/hr at 05/25/21 0544     Objective: Vitals:   05/25/21 0300 05/25/21 0400 05/25/21 0500 05/25/21 0600  BP: 133/74 118/72 132/74 128/69  Pulse: (!) 51 (!) 55 (!) 56 (!) 56  Resp: '11 10 12 11  ' Temp:      TempSrc:      SpO2: 100% 100% 98% 98%  Weight:      Height:        Intake/Output Summary (Last 24 hours) at 05/25/2021 3212 Last data filed at 05/25/2021 0546 Gross per 24 hour  Intake 1560.83 ml  Output 2000 ml  Net -439.17 ml   Filed Weights   05/24/21 1321  Weight: 77.1 kg     Examination:  General: A/O x4, positive acute respiratory distress Eyes: negative scleral hemorrhage, negative anisocoria, negative icterus ENT: Negative Runny nose, negative gingival bleeding, Neck:  Negative scars, masses, torticollis, lymphadenopathy, JVD Lungs: decreased bilaterally without wheezes or crackles,positive rhonchi, Cardiovascular: Regular rate and rhythm without murmur gallop  or rub normal S1 and S2 Abdomen: negative abdominal pain, nondistended, positive soft, bowel sounds, no rebound, no ascites, no appreciable mass Extremities: No significant cyanosis, clubbing, or edema bilateral lower extremities Skin: Negative rashes, lesions, ulcers Psychiatric:  Negative depression, negative anxiety, negative fatigue, negative mania  Central nervous system:  Cranial nerves II through XII intact, tongue/uvula midline, all extremities muscle strength 5/5, sensation intact throughout, negative dysarthria, negative expressive aphasia, negative receptive aphasia.  .     Data Reviewed: Care during the described time interval was provided by me .  I have reviewed this patient's available data, including medical history, events of note, physical examination, and all test results as part of my evaluation.   CBC: Recent Labs  Lab 05/18/21 1336 05/20/21 0827 05/24/21 1323 05/25/21 0637  WBC 6.8 9.1 2.9* 3.3*  NEUTROABS 5.0 7.6 1.5*  --   HGB 10.8* 10.0* 11.5* 10.4*  HCT 34.5* 32.3* 35.7* 32.0*  MCV 86.9 88.3 85.4 86.7  PLT 159 190 255 509   Basic Metabolic Panel: Recent Labs  Lab 05/18/21 1336 05/20/21 0827 05/24/21 1323 05/25/21 0637  NA 132* 139 130* 133*  K 3.6 3.1* 3.5 3.8  CL 101 104 96* 100  CO2 '27 24 22 23  ' GLUCOSE 105* 103* 112* 118*  BUN '17 17 16 19  ' CREATININE 0.95 0.97 0.99 0.96  CALCIUM 7.6* 8.2* 7.3* 7.1*  MG 1.9  --   --   --    GFR: Estimated Creatinine Clearance: 53.2 mL/min (by C-G formula based on SCr of 0.96 mg/dL). Liver Function  Tests: Recent Labs  Lab 05/18/21 1336 05/20/21 0827 05/24/21 1323 05/25/21 0637  AST '21 19 24 23  ' ALT '17 18 15 14  ' ALKPHOS 106 130* 98 84  BILITOT 0.4 0.4 1.4* 0.8  PROT 8.1 7.9 8.6* 7.8  ALBUMIN 3.9 3.7 3.8 3.2*   No results for input(s): LIPASE, AMYLASE in the last 168 hours. No results for input(s): AMMONIA in the last 168 hours. Coagulation Profile: Recent Labs  Lab 05/25/21 0637  INR 1.2   Cardiac Enzymes: No results for input(s): CKTOTAL, CKMB, CKMBINDEX, TROPONINI in the last 168 hours. BNP (last 3 results) No results for input(s): PROBNP in the last 8760 hours. HbA1C: No results for input(s): HGBA1C in the last 72 hours. CBG: No results for input(s): GLUCAP in the last 168 hours. Lipid Profile: No results for input(s): CHOL, HDL, LDLCALC, TRIG, CHOLHDL, LDLDIRECT in the last 72 hours. Thyroid Function Tests: No results for input(s): TSH, T4TOTAL, FREET4, T3FREE, THYROIDAB in the last 72 hours. Anemia Panel: No results for input(s): VITAMINB12, FOLATE, FERRITIN, TIBC, IRON, RETICCTPCT in the last 72 hours. Urine analysis:    Component Value Date/Time   COLORURINE STRAW (A) 05/25/2021 0049   APPEARANCEUR CLEAR (A) 05/25/2021 0049   LABSPEC 1.008 05/25/2021 0049   PHURINE 5.0 05/25/2021 0049   GLUCOSEU NEGATIVE 05/25/2021 0049   HGBUR MODERATE (A) 05/25/2021 0049   BILIRUBINUR NEGATIVE 05/25/2021 0049   KETONESUR NEGATIVE 05/25/2021 0049   PROTEINUR 30 (A) 05/25/2021 0049   NITRITE NEGATIVE 05/25/2021 0049   LEUKOCYTESUR NEGATIVE 05/25/2021 0049   Sepsis Labs: '@LABRCNTIP' (procalcitonin:4,lacticidven:4)  ) Recent Results (from the past 240 hour(s))  Culture, blood (routine x 2)     Status: None (Preliminary result)   Collection Time: 05/24/21  2:32 PM   Specimen: BLOOD  Result Value Ref Range Status   Specimen Description BLOOD RIGHT [facility]  Final   Special Requests   Final  BOTTLES DRAWN AEROBIC AND ANAEROBIC Blood Culture adequate volume   Culture    Final    NO GROWTH < 24 HOURS Performed at [facility], [address] [place], [place] [address]    Report Status PENDING  Incomplete  Culture, blood (routine x 2)     Status: None (Preliminary result)   Collection Time: 05/24/21  2:32 PM   Specimen: BLOOD  Result Value Ref Range Status   Specimen Description BLOOD LEFT AC  Final   Special Requests   Final    BOTTLES DRAWN AEROBIC AND ANAEROBIC Blood Culture adequate volume   Culture   Final    NO GROWTH < 24 HOURS Performed at [facility], [address] [place], [place] [address]    Report Status PENDING  Incomplete  Resp Panel by RT-PCR (Flu A&B, Covid) Nasopharyngeal Swab     Status: None   Collection Time: 05/24/21  2:32 PM   Specimen: Nasopharyngeal Swab; Nasopharyngeal(NP) swabs in vial transport medium  Result Value Ref Range Status   SARS Coronavirus 2 by RT PCR NEGATIVE NEGATIVE Final    Comment: (NOTE) SARS-CoV-2 target nucleic acids are NOT DETECTED.  The SARS-CoV-2 RNA is generally detectable in upper respiratory specimens during the acute phase of infection. The lowest concentration of SARS-CoV-2 viral copies this assay can detect is 138 copies/mL. A negative result does not preclude SARS-Cov-2 infection and should not be used as the sole basis for treatment or other patient management decisions. A negative result may occur with  improper specimen collection/handling, submission of specimen other than nasopharyngeal swab, presence of viral mutation(s) within the areas targeted by this assay, and inadequate number of viral copies(<138 copies/mL). A negative result must be combined with clinical observations, patient history, and epidemiological information. The expected result is Negative.  Fact Sheet for Patients:  EntrepreneurPulse.com.au  Fact Sheet for Healthcare Providers:  IncredibleEmployment.be  This test is no t yet approved or cleared by the  [place] FDA and  has been authorized for detection and/or diagnosis of SARS-CoV-2 by FDA under an Emergency Use Authorization (EUA). This EUA will remain  in effect (meaning this test can be used) for the duration of the COVID-19 declaration under Section 564(b)(1) of the Act, 21 U.S.C.section 360bbb-3(b)(1), unless the authorization is terminated  or revoked sooner.       Influenza A by PCR NEGATIVE NEGATIVE Final   Influenza B by PCR NEGATIVE NEGATIVE Final    Comment: (NOTE) The Xpert Xpress SARS-CoV-2/FLU/RSV plus assay is intended as an aid in the diagnosis of influenza from Nasopharyngeal swab specimens and should not be used as a sole basis for treatment. Nasal washings and aspirates are unacceptable for Xpert Xpress SARS-CoV-2/FLU/RSV testing.  Fact Sheet for Patients: EntrepreneurPulse.com.au  Fact Sheet for Healthcare Providers: IncredibleEmployment.be  This test is not yet approved or cleared by the [place] FDA and has been authorized for detection and/or diagnosis of SARS-CoV-2 by FDA under an Emergency Use Authorization (EUA). This EUA will remain in effect (meaning this test can be used) for the duration of the COVID-19 declaration under Section 564(b)(1) of the Act, 21 U.S.C. section 360bbb-3(b)(1), unless the authorization is terminated or revoked.  Performed at [facility], [address] [place], [place] [address]          Radiology Studies: DG Chest 2 View  Result Date: 05/24/2021 CLINICAL DATA:  Shortness of breath EXAM: CHEST - 2 VIEW COMPARISON:  10/10/2020 FINDINGS: Bibasilar airspace disease likely  reflecting atelectasis. No focal consolidation. No pleural effusion or pneumothorax. Heart and mediastinal contours are unremarkable. No acute osseous abnormality. IMPRESSION: No active cardiopulmonary disease. Electronically Signed   By: Kathreen Devoid M.D.   On: 05/24/2021 14:46   CT Angio  Chest PE W and/or Wo Contrast  Result Date: 05/24/2021 CLINICAL DATA:  Left lateral rib pain, shortness of breath EXAM: CT ANGIOGRAPHY CHEST CT ABDOMEN AND PELVIS WITH CONTRAST TECHNIQUE: Multidetector CT imaging of the chest was performed using the standard protocol during bolus administration of intravenous contrast. Multiplanar CT image reconstructions and MIPs were obtained to evaluate the vascular anatomy. Multidetector CT imaging of the abdomen and pelvis was performed using the standard protocol during bolus administration of intravenous contrast. CONTRAST:  72m OMNIPAQUE IOHEXOL 350 MG/ML SOLN COMPARISON:  None. FINDINGS: CTA CHEST FINDINGS Cardiovascular: Satisfactory opacification of the pulmonary arteries to the segmental level. No evidence of pulmonary embolism. Normal heart size. No pericardial effusion. Thoracic aortic atherosclerosis. Mediastinum/Nodes: No enlarged mediastinal, hilar, or axillary lymph nodes. Thyroid gland, trachea, and esophagus demonstrate no significant findings. Lungs/Pleura: Bilateral lower lobe airspace disease which may reflect atelectasis versus pneumonia, left greater than right. No pleural effusion or pneumothorax. Musculoskeletal: No chest wall abnormality. No acute or significant osseous findings. Review of the MIP images confirms the above findings. CT ABDOMEN and PELVIS FINDINGS Hepatobiliary: 5 mm hypodense lesion in the left hepatic lobe consistent with a small cyst. No other focal hepatic mass. No intrahepatic or extrahepatic biliary ductal dilatation. Normal gallbladder. Pancreas: Unremarkable. No pancreatic ductal dilatation or surrounding inflammatory changes. Spleen: Normal in size without focal abnormality. Adrenals/Urinary Tract: Adrenal glands are unremarkable. Kidneys are normal, without renal calculi, focal lesion, or hydronephrosis. Bladder is unremarkable. Stomach/Bowel: Stomach is within normal limits. Appendix appears normal. No evidence of bowel wall  thickening, distention, or inflammatory changes. Vascular/Lymphatic: Normal caliber abdominal aorta with moderate atherosclerosis. No lymphadenopathy. Reproductive: Uterus and bilateral adnexa are unremarkable. Small calcified uterine fibroid. Other: No abdominal wall hernia or abnormality. No abdominopelvic ascites. Musculoskeletal: No acute osseous abnormality. No aggressive osseous lesion. Grade 1 anterolisthesis of L4 on L5. Bilateral facet arthropathy throughout the thoracolumbar spine. Moderate-severe spinal stenosis at L4-5. Mild osteoarthritis of the hips bilaterally. Expansion of the left rectus abdominus muscle concerning for an intramuscular hematoma. Review of the MIP images confirms the above findings. IMPRESSION: 1. Expansion of the left rectus abdominus muscle concerning for an intramuscular hematoma. 2. Bilateral lower lobe airspace disease which may reflect atelectasis versus pneumonia, left greater than right. 3. Lumbar spine spondylosis as described above. 4.  Aortic Atherosclerosis (ICD10-I70.0). Electronically Signed   By: HKathreen DevoidM.D.   On: 05/24/2021 15:46   CT ABDOMEN PELVIS W CONTRAST  Result Date: 05/24/2021 CLINICAL DATA:  Left lateral rib pain, shortness of breath EXAM: CT ANGIOGRAPHY CHEST CT ABDOMEN AND PELVIS WITH CONTRAST TECHNIQUE: Multidetector CT imaging of the chest was performed using the standard protocol during bolus administration of intravenous contrast. Multiplanar CT image reconstructions and MIPs were obtained to evaluate the vascular anatomy. Multidetector CT imaging of the abdomen and pelvis was performed using the standard protocol during bolus administration of intravenous contrast. CONTRAST:  751mOMNIPAQUE IOHEXOL 350 MG/ML SOLN COMPARISON:  None. FINDINGS: CTA CHEST FINDINGS Cardiovascular: Satisfactory opacification of the pulmonary arteries to the segmental level. No evidence of pulmonary embolism. Normal heart size. No pericardial effusion. Thoracic  aortic atherosclerosis. Mediastinum/Nodes: No enlarged mediastinal, hilar, or axillary lymph nodes. Thyroid gland, trachea, and esophagus demonstrate no significant findings. Lungs/Pleura:  Bilateral lower lobe airspace disease which may reflect atelectasis versus pneumonia, left greater than right. No pleural effusion or pneumothorax. Musculoskeletal: No chest wall abnormality. No acute or significant osseous findings. Review of the MIP images confirms the above findings. CT ABDOMEN and PELVIS FINDINGS Hepatobiliary: 5 mm hypodense lesion in the left hepatic lobe consistent with a small cyst. No other focal hepatic mass. No intrahepatic or extrahepatic biliary ductal dilatation. Normal gallbladder. Pancreas: Unremarkable. No pancreatic ductal dilatation or surrounding inflammatory changes. Spleen: Normal in size without focal abnormality. Adrenals/Urinary Tract: Adrenal glands are unremarkable. Kidneys are normal, without renal calculi, focal lesion, or hydronephrosis. Bladder is unremarkable. Stomach/Bowel: Stomach is within normal limits. Appendix appears normal. No evidence of bowel wall thickening, distention, or inflammatory changes. Vascular/Lymphatic: Normal caliber abdominal aorta with moderate atherosclerosis. No lymphadenopathy. Reproductive: Uterus and bilateral adnexa are unremarkable. Small calcified uterine fibroid. Other: No abdominal wall hernia or abnormality. No abdominopelvic ascites. Musculoskeletal: No acute osseous abnormality. No aggressive osseous lesion. Grade 1 anterolisthesis of L4 on L5. Bilateral facet arthropathy throughout the thoracolumbar spine. Moderate-severe spinal stenosis at L4-5. Mild osteoarthritis of the hips bilaterally. Expansion of the left rectus abdominus muscle concerning for an intramuscular hematoma. Review of the MIP images confirms the above findings. IMPRESSION: 1. Expansion of the left rectus abdominus muscle concerning for an intramuscular hematoma. 2. Bilateral  lower lobe airspace disease which may reflect atelectasis versus pneumonia, left greater than right. 3. Lumbar spine spondylosis as described above. 4.  Aortic Atherosclerosis (ICD10-I70.0). Electronically Signed   By: Kathreen Devoid M.D.   On: 05/24/2021 15:46        Scheduled Meds:  acyclovir  400 mg Oral BID   buPROPion  75 mg Oral Daily   feeding supplement  237 mL Oral BID BM   furosemide  40 mg Oral Daily   levothyroxine  112 mcg Oral Q0600   metoprolol succinate  37.5 mg Oral Daily   mometasone-formoterol  2 puff Inhalation BID   pantoprazole  40 mg Oral Daily   potassium chloride SA  20 mEq Oral BID   spironolactone  25 mg Oral Daily   Continuous Infusions:  azithromycin     cefTRIAXone (ROCEPHIN)  IV Stopped (05/24/21 2355)   lactated ringers 150 mL/hr at 05/25/21 0544     LOS: 1 day   The patient is critically ill with multiple organ systems failure and requires high complexity decision making for assessment and support, frequent evaluation and titration of therapies, application of advanced monitoring technologies and extensive interpretation of multiple databases. Critical Care Time devoted to patient care services described in this note  Time spent: 40 minutes     Shivonne Schwartzman, Geraldo Docker, MD [facility]   If 7PM-7AM, please contact night-coverage 05/25/2021, 8:08 AM

## 2021-05-25 NOTE — Telephone Encounter (Signed)
Patient daughter [name] called reporting that patient has been hospitalized and she is requesting a return call from doctor to discuss this. She is scheduled to come to office tomorrow for lab.doctor.treatment.

## 2021-05-25 NOTE — ED Notes (Signed)
Report to [place], rn. Pt hooked back up and placed back on 3L [place].

## 2021-05-25 NOTE — Telephone Encounter (Signed)
Daughter called to let us know that mom was admitted to the hosptial 10-30. Wants the nurse to call her about pt treatments at [phone]

## 2021-05-25 NOTE — Telephone Encounter (Signed)
Apts cnl for tom. Pt in [facility] for sepsis. Dr. Jacinto Reap- was made aware

## 2021-05-25 NOTE — Telephone Encounter (Signed)
Dr. Yevette Edwards made aware.

## 2021-05-25 NOTE — Telephone Encounter (Signed)
Pt is currently in the [facility] for sepsis. Script- declined at this time.

## 2021-05-26 ENCOUNTER — Inpatient Hospital Stay: Payer: Medicare Other

## 2021-05-26 ENCOUNTER — Inpatient Hospital Stay: Payer: Medicare Other | Admitting: Internal Medicine

## 2021-05-26 DIAGNOSIS — J181 Lobar pneumonia, unspecified organism: Secondary | ICD-10-CM

## 2021-05-26 DIAGNOSIS — C9000 Multiple myeloma not having achieved remission: Secondary | ICD-10-CM

## 2021-05-26 LAB — COMPREHENSIVE METABOLIC PANEL
ALT: 12 U/L (ref 0–44)
AST: 19 U/L (ref 15–41)
Albumin: 3.1 g/dL — ABNORMAL LOW (ref 3.5–5.0)
Alkaline Phosphatase: 71 U/L (ref 38–126)
Anion gap: 9 (ref 5–15)
BUN: 25 mg/dL — ABNORMAL HIGH (ref 8–23)
CO2: 22 mmol/L (ref 22–32)
Calcium: 7.5 mg/dL — ABNORMAL LOW (ref 8.9–10.3)
Chloride: 105 mmol/L (ref 98–111)
Creatinine, Ser: 0.79 mg/dL (ref 0.44–1.00)
GFR, Estimated: >60 mL/min (ref >60)
Glucose, Bld: 120 mg/dL — ABNORMAL HIGH (ref 70–99)
Potassium: 4.4 mmol/L (ref 3.5–5.1)
Sodium: 136 mmol/L (ref 135–145)
Total Bilirubin: 0.5 mg/dL (ref 0.3–1.2)
Total Protein: 7.1 g/dL (ref 6.5–8.1)

## 2021-05-26 LAB — CBC WITH DIFFERENTIAL/PLATELET
Abs Immature Granulocytes: 0.03 10*3/uL (ref 0.00–0.07)
Basophils Absolute: 0 10*3/uL (ref 0.0–0.1)
Basophils Relative: 0 %
Eosinophils Absolute: 0 10*3/uL (ref 0.0–0.5)
Eosinophils Relative: 0 %
HCT: 28.4 % — ABNORMAL LOW (ref 36.0–46.0)
Hemoglobin: 9.3 g/dL — ABNORMAL LOW (ref 12.0–15.0)
Immature Granulocytes: 1 %
Lymphocytes Relative: 25 %
Lymphs Abs: 0.9 10*3/uL (ref 0.7–4.0)
MCH: 27.5 pg (ref 26.0–34.0)
MCHC: 32.7 g/dL (ref 30.0–36.0)
MCV: 84 fL (ref 80.0–100.0)
Monocytes Absolute: 0.2 10*3/uL (ref 0.1–1.0)
Monocytes Relative: 6 %
Neutro Abs: 2.3 10*3/uL (ref 1.7–7.7)
Neutrophils Relative %: 68 %
Platelets: 220 10*3/uL (ref 150–400)
RBC: 3.38 MIL/uL — ABNORMAL LOW (ref 3.87–5.11)
RDW: 16.2 % — ABNORMAL HIGH (ref 11.5–15.5)
Smear Review: NORMAL
WBC Morphology: ABNORMAL
WBC: 3.4 10*3/uL — ABNORMAL LOW (ref 4.0–10.5)
nRBC: 0 % (ref 0.0–0.2)

## 2021-05-26 LAB — MAGNESIUM: Magnesium: 2.1 mg/dL (ref 1.7–2.4)

## 2021-05-26 LAB — PHOSPHORUS: Phosphorus: 2.2 mg/dL — ABNORMAL LOW (ref 2.5–4.6)

## 2021-05-26 NOTE — Progress Notes (Signed)
I spoke to patient's daughter regarding the admission to the hospital for pneumonia. With regards to future SQ Injections, could be switched over to IV given patient's abdominal hematoma.

## 2021-05-26 NOTE — Progress Notes (Signed)
Patient arrived from ER in stable condition. Patient alert and oriented. Oriented to unit and room. Able to use call light appropriately.

## 2021-05-26 NOTE — Progress Notes (Signed)
Patient returned from CT in stable condition. Visitor at bedside.

## 2021-05-26 NOTE — Progress Notes (Signed)
PROGRESS NOTE    Brandi [name]  HUT:[id number] DOB: Jan 25, 1951 DOA: 05/24/2021 PCP: [facility] [employer]   Brief Narrative:  71 yr BF PMHx MM for which she has received a BMT which initially put her into transmission, although recently it seems that it is "trying to come back" in her words. She is on Revlimid daily 3 weeks/4, although when she became ill oncology directed her to stop it. She also has CHF, COPD, IgA myeloma, Hypothyroidism, Difficult to obtain appropriate blood products due to the patient's multiple difficult antibodies, and HTN, hypothyroidism,  In the ED she was found to have a left lower lobe consolidate. Her WBC is low at 2.9 which is likely a sign of impending sepsis. Sodium is low at 130, and T Bili is elevated at 1.4 as an acute phase reactant. She is hypoxic at 86% on room air. The patient states that she occasionally requires O2 at home, and sometimes as much as 4l, but states that that is unusual. This definitally represents an increased FIO2 requirement. O2 saturations have continued to drop throughout her stay in the ED.    [facility] were consulted to admit the patient for further evaluation and admission.   Subjective: 11/1 patient now complaining of left lateral eye field deficit new onset x3 days   Assessment & Plan:  Covid vaccination;  Principal Problem:   Acute on chronic respiratory failure with hypoxia ([place]) Active Problems:   Multiple myeloma (HCC)   Lobar pneumonia (HCC)   Sepsis ([place])   Pleuritic chest pain   Hyponatremia   Leukopenia   Pneumonia   Hypothyroidism   COPD (chronic obstructive pulmonary disease) ([place])  Sepsis ([place]) -On admission RR of 24, a drop in presenting blood pressure of 159/82 - 108/89, Room air O2 saturations of 86%, and a drop of Saturations from 97% on 4L to 92% on 4L, WBC is 2.9,  Acute on chronic respiratory failure with hypoxia (HCC) -occasionally has to use O2 at home, and that sometimes  she has needed as much as 4L.  -Presented to ED FIO2 of 86% on room air. She is currently saturating 98% on 4L by [place]. -Solu-Medrol 80 mg daily - DuoNeb QID -Flutter valve - Incentive spirometry -Complete 7-day course antibiotics -Sputum pending  COPD (chronic obstructive pulmonary disease) ([place]) -See respiratory failure    CAP LLL -Consolidation of the left lower lobe. Most likely community acquired pneumonia.  -IV Rocephin and Azithromycin.  -Legionella and strep pneumonia as well.   Left lateral vision loss stroke -11/1complaining of left lateral eye field deficit new onset x3 days.  CT head pending ADDENDUM: No acute intracranial process-stroke ruled out - 11/2 consult ophthalmology    Hyponatremia ( Baseline 138 and 140) Lab Results  Component Value Date   NA 136 05/26/2021   NA 133 (L) 05/25/2021   NA 130 (L) 05/24/2021   NA 139 05/20/2021   NA 132 (L) 05/18/2021  -Improving  Leukopenia -WBC is 2.9. Last WBC was 9.1 on 05/20/2021. Likely due to severe infection. Monitor for other signs of sepsis. None currently.    Multiple myeloma (HCC) -Noted. 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  -Revlimid daily for 3/4 weeks, although she was told to stop it when she became ill by her oncologist. It has been held. I would consider her to be immunosuppressed to some degree.   Pleuritic chest pain -  Due to pneumonia. Hydrocodone will be made available.  -EKG is abnormal is 1degree AV Block and PVC's. Troponins normal at 11 and 14. Monitor on telemetry.    Hyperthyroidism -Patient states that she has been taking methemizole. -Continue Synthoird at 112 mcg daily as at home. -11/1 recheck thyroid panel  LEFT rectus abdominis Hematoma - Hold all anticoagulation     Obese (BMI 31.09 kg/m)     DVT prophylaxis: SCD Code Status: Full Family Communication:  Status is:  Inpatient    Dispo: The patient is from: Home              Anticipated d/c is to: Home              Anticipated d/c date is: > 3 days              Patient currently is not medically stable to d/c.      Consultants:    Procedures/Significant Events:     I have personally reviewed and interpreted all radiology studies and my findings are as above.  VENTILATOR SETTINGS:    Cultures   Antimicrobials: Anti-infectives (From admission, onward)    Start     Ordered Stop   05/25/21 1530  azithromycin (ZITHROMAX) 500 mg in sodium chloride 0.9 % 250 mL IVPB        05/24/21 1646 05/30/21 1529   05/24/21 2300  cefTRIAXone (ROCEPHIN) 2 g in sodium chloride 0.9 % 100 mL IVPB        05/24/21 1646 05/29/21 2259   05/24/21 2200  acyclovir (ZOVIRAX) 200 MG capsule 400 mg        05/24/21 1642     05/24/21 1430  vancomycin (VANCOCIN) IVPB 1000 mg/200 mL premix        05/24/21 1421 05/24/21 1820   05/24/21 1430  ceFEPIme (MAXIPIME) 2 g in sodium chloride 0.9 % 100 mL IVPB        05/24/21 1421 05/24/21 1612   05/24/21 1430  azithromycin (ZITHROMAX) 500 mg in sodium chloride 0.9 % 250 mL IVPB        05/24/21 1421 05/24/21 1709         Devices    LINES / TUBES:      Continuous Infusions:  azithromycin Stopped (05/25/21 1939)   cefTRIAXone (ROCEPHIN)  IV Stopped (05/26/21 0240)     Objective: Vitals:   05/26/21 0600 05/26/21 0700 05/26/21 0800 05/26/21 0900  BP: 126/63 132/67 131/63 (!) 125/57  Pulse: (!) 52 60 (!) 49 (!) 48  Resp: '14 17 14 15  ' Temp:      TempSrc:      SpO2: 99% 100% 98% 98%  Weight:      Height:        Intake/Output Summary (Last 24 hours) at 05/26/2021 0914 Last data filed at 05/25/2021 1811 Gross per 24 hour  Intake --  Output 1200 ml  Net -1200 ml    Filed Weights   05/24/21 1321  Weight: 77.1 kg    Examination:  General: A/O x4, positive acute respiratory distress (improved from 10/31) Eyes: negative scleral hemorrhage, negative  anisocoria, negative icterus ENT: Negative Runny nose, negative gingival bleeding, Neck:  Negative scars, masses, torticollis, lymphadenopathy, JVD Lungs: decreased bilaterally without wheezes or crackles,positive rhonchi, Cardiovascular: Regular rate and rhythm without murmur gallop or rub normal S1 and S2 Abdomen: negative abdominal pain, nondistended, positive soft, bowel sounds, no rebound, no ascites, no appreciable mass Extremities: No significant cyanosis, clubbing, or edema bilateral  lower extremities Skin: Negative rashes, lesions, ulcers Psychiatric:  Negative depression, negative anxiety, negative fatigue, negative mania  Central nervous system:  Cranial nerves II through XII intact, tongue/uvula midline, all extremities muscle strength 5/5, sensation intact throughout, negative dysarthria, negative expressive aphasia, negative receptive aphasia.  .     Data Reviewed: Care during the described time interval was provided by me .  I have reviewed this patient's available data, including medical history, events of note, physical examination, and all test results as part of my evaluation.   CBC: Recent Labs  Lab 05/20/21 0827 05/24/21 1323 05/25/21 0637 05/26/21 0649  WBC 9.1 2.9* 3.3* 3.4*  NEUTROABS 7.6 1.5*  --  2.3  HGB 10.0* 11.5* 10.4* 9.3*  HCT 32.3* 35.7* 32.0* 28.4*  MCV 88.3 85.4 86.7 84.0  PLT 190 255 231 701    Basic Metabolic Panel: Recent Labs  Lab 05/20/21 0827 05/24/21 1323 05/25/21 0637 05/26/21 0649  NA 139 130* 133* 136  K 3.1* 3.5 3.8 4.4  CL 104 96* 100 105  CO2 '24 22 23 22  ' GLUCOSE 103* 112* 118* 120*  BUN '17 16 19 ' 25*  CREATININE 0.97 0.99 0.96 0.79  CALCIUM 8.2* 7.3* 7.1* 7.5*  MG  --   --   --  2.1  PHOS  --   --   --  2.2*    GFR: Estimated Creatinine Clearance: 63.8 mL/min (by C-G formula based on SCr of 0.79 mg/dL). Liver Function Tests: Recent Labs  Lab 05/20/21 0827 05/24/21 1323 05/25/21 0637 05/26/21 0649  AST '19 24 23  19  ' ALT '18 15 14 12  ' ALKPHOS 130* 98 84 71  BILITOT 0.4 1.4* 0.8 0.5  PROT 7.9 8.6* 7.8 7.1  ALBUMIN 3.7 3.8 3.2* 3.1*    No results for input(s): LIPASE, AMYLASE in the last 168 hours. No results for input(s): AMMONIA in the last 168 hours. Coagulation Profile: Recent Labs  Lab 05/25/21 0637  INR 1.2    Cardiac Enzymes: No results for input(s): CKTOTAL, CKMB, CKMBINDEX, TROPONINI in the last 168 hours. BNP (last 3 results) No results for input(s): PROBNP in the last 8760 hours. HbA1C: No results for input(s): HGBA1C in the last 72 hours. CBG: No results for input(s): GLUCAP in the last 168 hours. Lipid Profile: No results for input(s): CHOL, HDL, LDLCALC, TRIG, CHOLHDL, LDLDIRECT in the last 72 hours. Thyroid Function Tests: No results for input(s): TSH, T4TOTAL, FREET4, T3FREE, THYROIDAB in the last 72 hours. Anemia Panel: No results for input(s): VITAMINB12, FOLATE, FERRITIN, TIBC, IRON, RETICCTPCT in the last 72 hours. Urine analysis:    Component Value Date/Time   COLORURINE STRAW (A) 05/25/2021 0049   APPEARANCEUR CLEAR (A) 05/25/2021 0049   LABSPEC 1.008 05/25/2021 0049   PHURINE 5.0 05/25/2021 0049   GLUCOSEU NEGATIVE 05/25/2021 0049   HGBUR MODERATE (A) 05/25/2021 0049   BILIRUBINUR NEGATIVE 05/25/2021 0049   KETONESUR NEGATIVE 05/25/2021 0049   PROTEINUR 30 (A) 05/25/2021 0049   NITRITE NEGATIVE 05/25/2021 0049   LEUKOCYTESUR NEGATIVE 05/25/2021 0049   Sepsis Labs: '@LABRCNTIP' (procalcitonin:4,lacticidven:4)  ) Recent Results (from the past 240 hour(s))  Culture, blood (routine x 2)     Status: None (Preliminary result)   Collection Time: 05/24/21  2:32 PM   Specimen: BLOOD  Result Value Ref Range Status   Specimen Description BLOOD RIGHT [facility]  Final   Special Requests   Final    BOTTLES DRAWN AEROBIC AND ANAEROBIC Blood Culture adequate volume   Culture  Final    NO GROWTH 2 DAYS Performed at [facility], [address] [place],  [place] [address]    Report Status PENDING  Incomplete  Culture, blood (routine x 2)     Status: None (Preliminary result)   Collection Time: 05/24/21  2:32 PM   Specimen: BLOOD  Result Value Ref Range Status   Specimen Description BLOOD LEFT AC  Final   Special Requests   Final    BOTTLES DRAWN AEROBIC AND ANAEROBIC Blood Culture adequate volume   Culture   Final    NO GROWTH 2 DAYS Performed at [facility], [address], [place], [place] [address]    Report Status PENDING  Incomplete  Resp Panel by RT-PCR (Flu A&B, Covid) Nasopharyngeal Swab     Status: None   Collection Time: 05/24/21  2:32 PM   Specimen: Nasopharyngeal Swab; Nasopharyngeal(NP) swabs in vial transport medium  Result Value Ref Range Status   SARS Coronavirus 2 by RT PCR NEGATIVE NEGATIVE Final    Comment: (NOTE) SARS-CoV-2 target nucleic acids are NOT DETECTED.  The SARS-CoV-2 RNA is generally detectable in upper respiratory specimens during the acute phase of infection. The lowest concentration of SARS-CoV-2 viral copies this assay can detect is 138 copies/mL. A negative result does not preclude SARS-Cov-2 infection and should not be used as the sole basis for treatment or other patient management decisions. A negative result may occur with  improper specimen collection/handling, submission of specimen other than nasopharyngeal swab, presence of viral mutation(s) within the areas targeted by this assay, and inadequate number of viral copies(<138 copies/mL). A negative result must be combined with clinical observations, patient history, and epidemiological information. The expected result is Negative.  Fact Sheet for Patients:  EntrepreneurPulse.com.au  Fact Sheet for Healthcare Providers:  IncredibleEmployment.be  This test is no t yet approved or cleared by the [place] FDA and  has been authorized for detection and/or diagnosis of SARS-CoV-2 by FDA under an  Emergency Use Authorization (EUA). This EUA will remain  in effect (meaning this test can be used) for the duration of the COVID-19 declaration under Section 564(b)(1) of the Act, 21 U.S.C.section 360bbb-3(b)(1), unless the authorization is terminated  or revoked sooner.       Influenza A by PCR NEGATIVE NEGATIVE Final   Influenza B by PCR NEGATIVE NEGATIVE Final    Comment: (NOTE) The Xpert Xpress SARS-CoV-2/FLU/RSV plus assay is intended as an aid in the diagnosis of influenza from Nasopharyngeal swab specimens and should not be used as a sole basis for treatment. Nasal washings and aspirates are unacceptable for Xpert Xpress SARS-CoV-2/FLU/RSV testing.  Fact Sheet for Patients: EntrepreneurPulse.com.au  Fact Sheet for Healthcare Providers: IncredibleEmployment.be  This test is not yet approved or cleared by the [place] FDA and has been authorized for detection and/or diagnosis of SARS-CoV-2 by FDA under an Emergency Use Authorization (EUA). This EUA will remain in effect (meaning this test can be used) for the duration of the COVID-19 declaration under Section 564(b)(1) of the Act, 21 U.S.C. section 360bbb-3(b)(1), unless the authorization is terminated or revoked.  Performed at [facility], [address] [place], [place] [address]          Radiology Studies: DG Chest 2 View  Result Date: 05/24/2021 CLINICAL DATA:  Shortness of breath EXAM: CHEST - 2 VIEW COMPARISON:  10/10/2020 FINDINGS: Bibasilar airspace disease likely reflecting atelectasis. No focal consolidation. No pleural effusion or pneumothorax. Heart and mediastinal contours are unremarkable.  No acute osseous abnormality. IMPRESSION: No active cardiopulmonary disease. Electronically Signed   By: Kathreen Devoid M.D.   On: 05/24/2021 14:46   CT Angio Chest PE W and/or Wo Contrast  Result Date: 05/24/2021 CLINICAL DATA:  Left lateral rib pain, shortness of  breath EXAM: CT ANGIOGRAPHY CHEST CT ABDOMEN AND PELVIS WITH CONTRAST TECHNIQUE: Multidetector CT imaging of the chest was performed using the standard protocol during bolus administration of intravenous contrast. Multiplanar CT image reconstructions and MIPs were obtained to evaluate the vascular anatomy. Multidetector CT imaging of the abdomen and pelvis was performed using the standard protocol during bolus administration of intravenous contrast. CONTRAST:  5m OMNIPAQUE IOHEXOL 350 MG/ML SOLN COMPARISON:  None. FINDINGS: CTA CHEST FINDINGS Cardiovascular: Satisfactory opacification of the pulmonary arteries to the segmental level. No evidence of pulmonary embolism. Normal heart size. No pericardial effusion. Thoracic aortic atherosclerosis. Mediastinum/Nodes: No enlarged mediastinal, hilar, or axillary lymph nodes. Thyroid gland, trachea, and esophagus demonstrate no significant findings. Lungs/Pleura: Bilateral lower lobe airspace disease which may reflect atelectasis versus pneumonia, left greater than right. No pleural effusion or pneumothorax. Musculoskeletal: No chest wall abnormality. No acute or significant osseous findings. Review of the MIP images confirms the above findings. CT ABDOMEN and PELVIS FINDINGS Hepatobiliary: 5 mm hypodense lesion in the left hepatic lobe consistent with a small cyst. No other focal hepatic mass. No intrahepatic or extrahepatic biliary ductal dilatation. Normal gallbladder. Pancreas: Unremarkable. No pancreatic ductal dilatation or surrounding inflammatory changes. Spleen: Normal in size without focal abnormality. Adrenals/Urinary Tract: Adrenal glands are unremarkable. Kidneys are normal, without renal calculi, focal lesion, or hydronephrosis. Bladder is unremarkable. Stomach/Bowel: Stomach is within normal limits. Appendix appears normal. No evidence of bowel wall thickening, distention, or inflammatory changes. Vascular/Lymphatic: Normal caliber abdominal aorta with  moderate atherosclerosis. No lymphadenopathy. Reproductive: Uterus and bilateral adnexa are unremarkable. Small calcified uterine fibroid. Other: No abdominal wall hernia or abnormality. No abdominopelvic ascites. Musculoskeletal: No acute osseous abnormality. No aggressive osseous lesion. Grade 1 anterolisthesis of L4 on L5. Bilateral facet arthropathy throughout the thoracolumbar spine. Moderate-severe spinal stenosis at L4-5. Mild osteoarthritis of the hips bilaterally. Expansion of the left rectus abdominus muscle concerning for an intramuscular hematoma. Review of the MIP images confirms the above findings. IMPRESSION: 1. Expansion of the left rectus abdominus muscle concerning for an intramuscular hematoma. 2. Bilateral lower lobe airspace disease which may reflect atelectasis versus pneumonia, left greater than right. 3. Lumbar spine spondylosis as described above. 4.  Aortic Atherosclerosis (ICD10-I70.0). Electronically Signed   By: HKathreen DevoidM.D.   On: 05/24/2021 15:46   CT ABDOMEN PELVIS W CONTRAST  Result Date: 05/24/2021 CLINICAL DATA:  Left lateral rib pain, shortness of breath EXAM: CT ANGIOGRAPHY CHEST CT ABDOMEN AND PELVIS WITH CONTRAST TECHNIQUE: Multidetector CT imaging of the chest was performed using the standard protocol during bolus administration of intravenous contrast. Multiplanar CT image reconstructions and MIPs were obtained to evaluate the vascular anatomy. Multidetector CT imaging of the abdomen and pelvis was performed using the standard protocol during bolus administration of intravenous contrast. CONTRAST:  781mOMNIPAQUE IOHEXOL 350 MG/ML SOLN COMPARISON:  None. FINDINGS: CTA CHEST FINDINGS Cardiovascular: Satisfactory opacification of the pulmonary arteries to the segmental level. No evidence of pulmonary embolism. Normal heart size. No pericardial effusion. Thoracic aortic atherosclerosis. Mediastinum/Nodes: No enlarged mediastinal, hilar, or axillary lymph nodes. Thyroid  gland, trachea, and esophagus demonstrate no significant findings. Lungs/Pleura: Bilateral lower lobe airspace disease which may reflect atelectasis versus pneumonia, left greater than right. No  pleural effusion or pneumothorax. Musculoskeletal: No chest wall abnormality. No acute or significant osseous findings. Review of the MIP images confirms the above findings. CT ABDOMEN and PELVIS FINDINGS Hepatobiliary: 5 mm hypodense lesion in the left hepatic lobe consistent with a small cyst. No other focal hepatic mass. No intrahepatic or extrahepatic biliary ductal dilatation. Normal gallbladder. Pancreas: Unremarkable. No pancreatic ductal dilatation or surrounding inflammatory changes. Spleen: Normal in size without focal abnormality. Adrenals/Urinary Tract: Adrenal glands are unremarkable. Kidneys are normal, without renal calculi, focal lesion, or hydronephrosis. Bladder is unremarkable. Stomach/Bowel: Stomach is within normal limits. Appendix appears normal. No evidence of bowel wall thickening, distention, or inflammatory changes. Vascular/Lymphatic: Normal caliber abdominal aorta with moderate atherosclerosis. No lymphadenopathy. Reproductive: Uterus and bilateral adnexa are unremarkable. Small calcified uterine fibroid. Other: No abdominal wall hernia or abnormality. No abdominopelvic ascites. Musculoskeletal: No acute osseous abnormality. No aggressive osseous lesion. Grade 1 anterolisthesis of L4 on L5. Bilateral facet arthropathy throughout the thoracolumbar spine. Moderate-severe spinal stenosis at L4-5. Mild osteoarthritis of the hips bilaterally. Expansion of the left rectus abdominus muscle concerning for an intramuscular hematoma. Review of the MIP images confirms the above findings. IMPRESSION: 1. Expansion of the left rectus abdominus muscle concerning for an intramuscular hematoma. 2. Bilateral lower lobe airspace disease which may reflect atelectasis versus pneumonia, left greater than right. 3.  Lumbar spine spondylosis as described above. 4.  Aortic Atherosclerosis (ICD10-I70.0). Electronically Signed   By: Kathreen Devoid M.D.   On: 05/24/2021 15:46        Scheduled Meds:  acyclovir  400 mg Oral BID   buPROPion  75 mg Oral Daily   feeding supplement  237 mL Oral BID BM   furosemide  40 mg Oral Daily   ipratropium-albuterol  3 mL Nebulization Q6H   levothyroxine  112 mcg Oral Q0600   methylPREDNISolone (SOLU-MEDROL) injection  80 mg Intravenous Daily   metoprolol succinate  37.5 mg Oral Daily   mometasone-formoterol  2 puff Inhalation BID   pantoprazole  40 mg Oral Daily   potassium chloride SA  20 mEq Oral BID   spironolactone  25 mg Oral Daily   Continuous Infusions:  azithromycin Stopped (05/25/21 1939)   cefTRIAXone (ROCEPHIN)  IV Stopped (05/26/21 0240)     LOS: 2 days   The patient is critically ill with multiple organ systems failure and requires high complexity decision making for assessment and support, frequent evaluation and titration of therapies, application of advanced monitoring technologies and extensive interpretation of multiple databases. Critical Care Time devoted to patient care services described in this note  Time spent: 40 minutes     Takeo Harts, Geraldo Docker, MD [facility]   If 7PM-7AM, please contact night-coverage 05/26/2021, 9:14 AM

## 2021-05-26 NOTE — Progress Notes (Signed)
  Chaplain On-Call responded to [place] Order and provided spiritual and emotional support and prayer with the patient.  The patient stated that her faith helps her to cope with her illnesses and to face life with courage and hope.  Chaplain Pollyann Samples M.Div., [facility]

## 2021-05-27 LAB — COMPREHENSIVE METABOLIC PANEL
ALT: 29 U/L (ref 0–44)
AST: 28 U/L (ref 15–41)
Albumin: 3 g/dL — ABNORMAL LOW (ref 3.5–5.0)
Alkaline Phosphatase: 88 U/L (ref 38–126)
Anion gap: 9 (ref 5–15)
BUN: 24 mg/dL — ABNORMAL HIGH (ref 8–23)
CO2: 24 mmol/L (ref 22–32)
Calcium: 7.8 mg/dL — ABNORMAL LOW (ref 8.9–10.3)
Chloride: 106 mmol/L (ref 98–111)
Creatinine, Ser: 0.84 mg/dL (ref 0.44–1.00)
GFR, Estimated: >60 mL/min (ref >60)
Glucose, Bld: 105 mg/dL — ABNORMAL HIGH (ref 70–99)
Potassium: 4.1 mmol/L (ref 3.5–5.1)
Sodium: 139 mmol/L (ref 135–145)
Total Bilirubin: 0.6 mg/dL (ref 0.3–1.2)
Total Protein: 7.6 g/dL (ref 6.5–8.1)

## 2021-05-27 LAB — CBC WITH DIFFERENTIAL/PLATELET
Abs Immature Granulocytes: 0.03 10*3/uL (ref 0.00–0.07)
Basophils Absolute: 0 10*3/uL (ref 0.0–0.1)
Basophils Relative: 0 %
Eosinophils Absolute: 0 10*3/uL (ref 0.0–0.5)
Eosinophils Relative: 0 %
HCT: 30.1 % — ABNORMAL LOW (ref 36.0–46.0)
Hemoglobin: 9.6 g/dL — ABNORMAL LOW (ref 12.0–15.0)
Immature Granulocytes: 1 %
Lymphocytes Relative: 32 %
Lymphs Abs: 2 10*3/uL (ref 0.7–4.0)
MCH: 27.4 pg (ref 26.0–34.0)
MCHC: 31.9 g/dL (ref 30.0–36.0)
MCV: 85.8 fL (ref 80.0–100.0)
Monocytes Absolute: 0.6 10*3/uL (ref 0.1–1.0)
Monocytes Relative: 10 %
Neutro Abs: 3.6 10*3/uL (ref 1.7–7.7)
Neutrophils Relative %: 57 %
Platelets: 262 10*3/uL (ref 150–400)
RBC: 3.51 MIL/uL — ABNORMAL LOW (ref 3.87–5.11)
RDW: 16.3 % — ABNORMAL HIGH (ref 11.5–15.5)
WBC: 6.3 10*3/uL (ref 4.0–10.5)
nRBC: 0 % (ref 0.0–0.2)

## 2021-05-27 LAB — TSH: TSH: 2.285 u[IU]/mL (ref 0.350–4.500)

## 2021-05-27 LAB — PHOSPHORUS: Phosphorus: 2.5 mg/dL (ref 2.5–4.6)

## 2021-05-27 LAB — T4, FREE: Free T4: 0.79 ng/dL (ref 0.61–1.12)

## 2021-05-27 LAB — MAGNESIUM: Magnesium: 2.1 mg/dL (ref 1.7–2.4)

## 2021-05-27 MED ORDER — LACTULOSE 10 GM/15ML PO SOLN
20.0000 g | Freq: Once | ORAL | Status: AC
  Administered 2021-05-27: 10:00:00 20 g via ORAL
  Filled 2021-05-27: qty 30

## 2021-05-27 MED ORDER — CEFDINIR 300 MG PO CAPS: 300.0000 mg | ORAL_CAPSULE | Freq: Two times a day (BID) | ORAL | 0 refills | Status: AC

## 2021-05-27 MED ORDER — PREDNISONE 10 MG PO TABS: ORAL_TABLET | ORAL | 0 refills | Status: AC

## 2021-05-27 MED ORDER — AZITHROMYCIN 500 MG PO TABS: 500.0000 mg | ORAL_TABLET | Freq: Every day | ORAL | 0 refills | Status: AC

## 2021-05-27 NOTE — Discharge Summary (Signed)
Physician Discharge Summary  Patient ID: Brandi [name] MRN: [id number] DOB/AGE: 69/13/1952 69 y.o.  Admit date: 05/24/2021 Discharge date: 05/27/2021  Admission Diagnoses:  Discharge Diagnoses:  Principal Problem:   Acute on chronic respiratory failure with hypoxia ([place]) Active Problems:   Multiple myeloma (HCC)   Lobar pneumonia (HCC)   Sepsis (HCC)   Pleuritic chest pain   Hyponatremia   Leukopenia   Pneumonia   Hypothyroidism   COPD (chronic obstructive pulmonary disease) ([place])   Discharged Condition: good  Hospital Course:  15 yr BF PMHx MM for which she has received a BMT which initially put her into remission, although recently it seems that it is "trying to come back" in her words. She is on Revlimid daily 3 weeks/4, although when she became ill oncology directed her to stop it. She also has CHF, COPD, IgA myeloma, Hypothyroidism, Difficult to obtain appropriate blood products due to the patient's multiple difficult antibodies, and HTN, hypothyroidism,   In the ED she was found to have a left lower lobe consolidate. Her WBC is low at 2.9 which is likely a sign of impending sepsis. Sodium is low at 130, and T Bili is elevated at 1.4 as an acute phase reactant. She is hypoxic at 86% on room air. The patient states that she occasionally requires O2 at home, and sometimes as much as 4l, but states that that is unusual. This definitally represents an increased FIO2 requirement. O2 saturations have continued to drop throughout her stay in the ED.   Left lower lobe pneumonia. Acute on chronic respiratory failure with hypoxemia secondary to pneumonia. COPD exacerbation. I reviewed the chart, patient did not meet sepsis criteria at the time admission.  As result, sepsis is ruled out. Patient has increased short of breath, significant hypoxemia at admission.  She was using as needed oxygen at home, she was requiring 4 L oxygen, currently down to 2 L.  Condition had  improved. Patient also has clinical as well as radiological evidence of pneumonia.  She still have significant cough, she had a worsening shortness of breath.  Blood cultures so far has been negative. I also reviewed the chart, patient did have evidence of COPD exacerbation.  She had a worsening short of breath, she still has intermittent wheezing suggesting bronchospasm.  She has been treated with IV steroids, condition improving.  I will continue oral steroids taper.  Hyponatremia. Hypokalemia. Condition improved.  Multiple myeloma. Leukopenia. Leukopenia appears to be secondary to infection with baseline multiple myeloma.  Patient be followed with allergy in the near future to restart treatment.  Hypothyroidism. Continue Synthroid.  Left rectus abdominis hematoma. Follow-up with PCP as outpatient.  Hemoglobin has been stable.  Obesity with BMI of 31.09.  Pleuritic chest pain. CT chest ruled out a PE.  Most likely due to pneumonia.    Consults: None  Significant Diagnostic Studies:   CTA CHEST FINDINGS   Cardiovascular: Satisfactory opacification of the pulmonary arteries to the segmental level. No evidence of pulmonary embolism. Normal heart size. No pericardial effusion. Thoracic aortic atherosclerosis.   Mediastinum/Nodes: No enlarged mediastinal, hilar, or axillary lymph nodes. Thyroid gland, trachea, and esophagus demonstrate no significant findings.   Lungs/Pleura: Bilateral lower lobe airspace disease which may reflect atelectasis versus pneumonia, left greater than right. No pleural effusion or pneumothorax.   Musculoskeletal: No chest wall abnormality. No acute or significant osseous findings.   Review of the MIP images confirms the above findings.   CT ABDOMEN and PELVIS FINDINGS  Hepatobiliary: 5 mm hypodense lesion in the left hepatic lobe consistent with a small cyst. No other focal hepatic mass. No intrahepatic or extrahepatic biliary ductal  dilatation. Normal gallbladder.   Pancreas: Unremarkable. No pancreatic ductal dilatation or surrounding inflammatory changes.   Spleen: Normal in size without focal abnormality.   Adrenals/Urinary Tract: Adrenal glands are unremarkable. Kidneys are normal, without renal calculi, focal lesion, or hydronephrosis. Bladder is unremarkable.   Stomach/Bowel: Stomach is within normal limits. Appendix appears normal. No evidence of bowel wall thickening, distention, or inflammatory changes.   Vascular/Lymphatic: Normal caliber abdominal aorta with moderate atherosclerosis. No lymphadenopathy.   Reproductive: Uterus and bilateral adnexa are unremarkable. Small calcified uterine fibroid.   Other: No abdominal wall hernia or abnormality. No abdominopelvic ascites.   Musculoskeletal: No acute osseous abnormality. No aggressive osseous lesion. Grade 1 anterolisthesis of L4 on L5. Bilateral facet arthropathy throughout the thoracolumbar spine. Moderate-severe spinal stenosis at L4-5. Mild osteoarthritis of the hips bilaterally. Expansion of the left rectus abdominus muscle concerning for an intramuscular hematoma.   Review of the MIP images confirms the above findings.   IMPRESSION: 1. Expansion of the left rectus abdominus muscle concerning for an intramuscular hematoma. 2. Bilateral lower lobe airspace disease which may reflect atelectasis versus pneumonia, left greater than right. 3. Lumbar spine spondylosis as described above. 4.  Aortic Atherosclerosis (ICD10-I70.0).     Electronically Signed   By: Kathreen Devoid M.D.   On: 05/24/2021 15:46  Treatments: antibiotics, steroids  Discharge Exam: Blood pressure (!) 147/78, pulse (!) 52, temperature (!) 97.3 F (36.3 C), temperature source Oral, resp. rate 17, height $RemoveBe'5\' 2"'hrdfLqLCy$  (1.575 m), weight 77.1 kg, SpO2 97 %. General appearance: alert and cooperative Resp: Decreased breathing sounds without crackles or wheezes. Cardio: regular  rate and rhythm, S1, S2 normal, no murmur, click, rub or gallop GI: soft, non-tender; bowel sounds normal; no masses,  no organomegaly Extremities: extremities normal, atraumatic, no cyanosis or edema  Disposition: Discharge disposition: 01-Home or Self Care       Discharge Instructions     Diet - low sodium heart healthy   Complete by: As directed    Increase activity slowly   Complete by: As directed       Allergies as of 05/27/2021   No Known Allergies      Medication List     STOP taking these medications    lenalidomide 15 MG capsule Commonly known as: REVLIMID   mirtazapine 15 MG tablet Commonly known as: Remeron   montelukast 10 MG tablet Commonly known as: Singulair   prochlorperazine 10 MG tablet Commonly known as: COMPAZINE       TAKE these medications    acyclovir 400 MG tablet Commonly known as: ZOVIRAX Take 1 tablet (400 mg total) by mouth 2 (two) times daily.   albuterol 108 (90 Base) MCG/ACT inhaler Commonly known as: VENTOLIN HFA Inhale 2 puffs into the lungs every 6 (six) hours as needed for wheezing or shortness of breath.   aspirin 81 MG EC tablet Take 1 tablet (81 mg total) by mouth daily.   azithromycin 500 MG tablet Commonly known as: Zithromax Take 1 tablet (500 mg total) by mouth daily for 2 days. Take 1 tablet daily for 3 days.   benzonatate 100 MG capsule Commonly known as: TESSALON Take 1 capsule (100 mg total) by mouth 3 (three) times daily as needed for cough.   buPROPion 75 MG tablet Commonly known as: WELLBUTRIN Take 75 mg by mouth daily.  calcium-vitamin D 500MG-200UNIT (5MCG) tablet Commonly known as: OSCAL WITH D Take 1 tablet by mouth 2 (two) times daily.   cefdinir 300 MG capsule Commonly known as: OMNICEF Take 1 capsule (300 mg total) by mouth 2 (two) times daily for 5 days.   chlorpheniramine-HYDROcodone 10-8 MG/5ML Suer Commonly known as: TUSSIONEX Take 5 mLs by mouth every 12 (twelve) hours as  needed for cough.   dexamethasone 4 MG tablet Commonly known as: DECADRON Start 2 days prior to infusion; Take for 2 days. Do not take on the day of infusion. What changed:  how much to take how to take this when to take this   ergocalciferol 1.25 MG (50000 UT) capsule Commonly known as: VITAMIN D2 Take 1 capsule (50,000 Units total) by mouth once a week.   feeding supplement Liqd Take 237 mLs by mouth 2 (two) times daily between meals.   Fluticasone-Salmeterol 500-50 MCG/DOSE Aepb Commonly known as: Advair Diskus Inhale 1 puff into the lungs 2 (two) times daily.   furosemide 40 MG tablet Commonly known as: Lasix Take 1 tablet (40 mg total) by mouth daily.   HYDROcodone-acetaminophen 5-325 MG tablet Commonly known as: Norco Take 1 tablet by mouth every 6 (six) hours as needed for moderate pain.   Ipratropium-Albuterol 20-100 MCG/ACT Aers respimat Commonly known as: COMBIVENT Inhale into the lungs. What changed: Another medication with the same name was removed. Continue taking this medication, and follow the directions you see here.   levothyroxine 112 MCG tablet Commonly known as: SYNTHROID Take 112 mcg by mouth daily.   metoprolol succinate 25 MG 24 hr tablet Commonly known as: TOPROL-XL TAKE 1 & 1/2 (ONE & ONE-HALF) TABLETS BY MOUTH ONCE DAILY   ondansetron 8 MG tablet Commonly known as: ZOFRAN One pill every 8 hours as needed for nausea/vomitting.   pantoprazole 40 MG tablet Commonly known as: PROTONIX Take 1 tablet (40 mg total) by mouth daily.   polyethylene glycol 17 g packet Commonly known as: MIRALAX / GLYCOLAX Take 17 g by mouth daily as needed for mild constipation.   potassium chloride SA 20 MEQ tablet Commonly known as: KLOR-CON Take 1 tablet (20 mEq total) by mouth 2 (two) times daily.   predniSONE 10 MG tablet Commonly known as: DELTASONE Take 4 tablets (40 mg total) by mouth daily for 3 days, THEN 2 tablets (20 mg total) daily for 3 days,  THEN 1 tablet (10 mg total) daily for 3 days. Start taking on: May 27, 2021   spironolactone 25 MG tablet Commonly known as: ALDACTONE Take 1 tablet by mouth once daily        [place], [employer] Follow up in 1 week(s).   Specialty: General Practice Contact information: [place] [address] [phone]         Nelva Bush, MD .   Specialty: Cardiology Contact information: [place] [address] [phone]                32 minutes  Signed: Sharen Hones 05/27/2021, 9:30 AM

## 2021-05-27 NOTE — TOC Transition Note (Signed)
Transition of Care [facility]) - CM/SW Discharge Note   Patient Details  Name: Brandi [name] MRN: [id number] Date of Birth: 25-May-1951  Transition of Care [facility]) CM/SW Contact:  Shelbie Hutching, RN Phone Number: 05/27/2021, 10:47 AM   Clinical Narrative:    Patient admitted to the hospital with acute on chronic respiratory failure, history of COPD and CHF on oxygen at home. RNCM met with patient at the bedside this morning, she has been medically cleared for discharge home today with home health.  Patient reports that she has a home health aide that comes out everyday but Sunday for 2 hours to help with anything needed around the house.  Patient agrees to having a nurse with home health come out also.  [facility] is unable to accept patient, [name] given referral and will check availability.   Patient's daughter will pick her up today.  Patient lives alone but since being sick her son stays with her and her daughter sometimes.  Patient also reports that her neighbors check in on her regularly.  Daughter provides transportation but patient can drive if needed.  She is current with oncology and PCP.     Final next level of care: [place] Barriers to Discharge: Barriers Resolved   Patient Goals and CMS Choice Patient states their goals for this hospitalization and ongoing recovery are:: Patient will go home but still doesn't feel quite ready CMS Medicare.gov Compare Post Acute Care list provided to:: Patient Choice offered to / list presented to : Patient  Discharge Placement                       Discharge Plan and Services   Discharge Planning Services: CM Consult Post Acute Care Choice: Home Health          DME Arranged: N/A DME Agency: NA       HH Arranged: RN [place] Agency: [place] Date [facility] Agency Contacted: 05/27/21 Time [place]: [id number] Representative spoke with at [place]: [place] Determinants of Health ([place])  Interventions     Readmission Risk Interventions Readmission Risk Prevention Plan 05/27/2021  Transportation Screening Complete  Medication Review Press photographer) Complete  PCP or Specialist appointment within 3-5 days of discharge Complete  HRI or [place] Complete  SW Recovery Care/Counseling Consult Complete  Palliative Care Screening Complete  [place] Not Applicable  Some recent data might be hidden

## 2021-05-27 NOTE — Care Management Important Message (Signed)
Important Message  Patient Details  Name: Brandi [name] MRN: [id number] Date of Birth: [birth year]   Medicare Important Message Given:  Yes     Juliann Pulse A Darris Carachure 05/27/2021, 11:50 AM

## 2021-05-28 ENCOUNTER — Telehealth: Payer: Self-pay | Admitting: Internal Medicine

## 2021-05-28 ENCOUNTER — Other Ambulatory Visit: Payer: Self-pay

## 2021-05-28 LAB — LEGIONELLA PNEUMOPHILA SEROGP 1 UR AG: L. pneumophila Serogp 1 Ur Ag: NEGATIVE

## 2021-05-28 MED ORDER — METOPROLOL SUCCINATE ER 25 MG PO TB24: ORAL_TABLET | ORAL | 5 refills | Status: DC

## 2021-05-28 NOTE — Telephone Encounter (Signed)
*  STAT* If patient is at the pharmacy, call can be transferred to refill team.   1. Which medications need to be refilled? (please list name of each medication and dose if known) metoprolol 25 MG 1 1/2 tablet daily   2. Which pharmacy/location (including street and city if local pharmacy) is medication to be sent to? [facility] on [address]   3. Do they need a 30 day or 90 day supply? 90 day  Patient states she misplaced other prescription.

## 2021-05-28 NOTE — Telephone Encounter (Signed)
C-please reschedule the patient to the week of the 14th NOV. MD; labs- cbc/cmp/MM panel; K/l light chains. HOLD daratumumab. GB

## 2021-05-28 NOTE — Telephone Encounter (Signed)
metoprolol succinate (TOPROL-XL) 25 MG 24 hr tablet 45 tablet 5 05/28/2021    Sig: TAKE 1 & 1/2 (ONE & ONE-HALF) TABLETS BY MOUTH ONCE DAILY   Sent to pharmacy as: metoprolol succinate (TOPROL-XL) 25 MG 24 hr tablet   E-Prescribing Status: Sent to pharmacy (05/28/2021  1:05 PM EDT)    Pharmacy  [facility] [place] [facility] [place] [address]

## 2021-05-29 ENCOUNTER — Other Ambulatory Visit
Admission: RE | Admit: 2021-05-29 | Discharge: 2021-05-29 | Disposition: A | Discharge status: Home / Self Care | Payer: Medicare Other | Source: Ambulatory Visit | Attending: Internal Medicine | Admitting: Internal Medicine

## 2021-05-29 ENCOUNTER — Other Ambulatory Visit: Payer: Self-pay | Admitting: *Deleted

## 2021-05-29 ENCOUNTER — Telehealth: Payer: Self-pay

## 2021-05-29 ENCOUNTER — Encounter: Payer: Self-pay | Admitting: Medical

## 2021-05-29 ENCOUNTER — Other Ambulatory Visit: Payer: Self-pay

## 2021-05-29 ENCOUNTER — Other Ambulatory Visit
Admission: RE | Admit: 2021-05-29 | Discharge: 2021-05-29 | Disposition: A | Discharge status: Home / Self Care | Payer: Medicare Other | Source: Ambulatory Visit | Attending: Medical | Admitting: Medical

## 2021-05-29 ENCOUNTER — Ambulatory Visit (INDEPENDENT_AMBULATORY_CARE_PROVIDER_SITE_OTHER): Payer: Medicare Other | Admitting: Medical

## 2021-05-29 VITALS — BP 150/82 | HR 55 | Ht 62.0 in | Wt 168.0 lb

## 2021-05-29 DIAGNOSIS — E854 Organ-limited amyloidosis: Secondary | ICD-10-CM | POA: Insufficient documentation

## 2021-05-29 DIAGNOSIS — I43 Cardiomyopathy in diseases classified elsewhere: Secondary | ICD-10-CM

## 2021-05-29 DIAGNOSIS — I5032 Chronic diastolic (congestive) heart failure: Secondary | ICD-10-CM | POA: Insufficient documentation

## 2021-05-29 DIAGNOSIS — C9000 Multiple myeloma not having achieved remission: Secondary | ICD-10-CM

## 2021-05-29 DIAGNOSIS — J449 Chronic obstructive pulmonary disease, unspecified: Secondary | ICD-10-CM | POA: Diagnosis not present

## 2021-05-29 LAB — BASIC METABOLIC PANEL
Anion gap: 11 (ref 5–15)
BUN: 29 mg/dL — ABNORMAL HIGH (ref 8–23)
CO2: 23 mmol/L (ref 22–32)
Calcium: 8.7 mg/dL — ABNORMAL LOW (ref 8.9–10.3)
Chloride: 102 mmol/L (ref 98–111)
Creatinine, Ser: 0.93 mg/dL (ref 0.44–1.00)
GFR, Estimated: >60 mL/min (ref >60)
Glucose, Bld: 113 mg/dL — ABNORMAL HIGH (ref 70–99)
Potassium: 4.3 mmol/L (ref 3.5–5.1)
Sodium: 136 mmol/L (ref 135–145)

## 2021-05-29 LAB — CULTURE, BLOOD (ROUTINE X 2)
Culture: NO GROWTH
Culture: NO GROWTH
Special Requests: ADEQUATE
Special Requests: ADEQUATE

## 2021-05-29 LAB — BRAIN NATRIURETIC PEPTIDE: B Natriuretic Peptide: 58.8 pg/mL (ref 0.0–100.0)

## 2021-05-29 MED ORDER — METOPROLOL SUCCINATE ER 25 MG PO TB24: 37.0000 mg | ORAL_TABLET | Freq: Every day | ORAL | 0 refills | Status: DC

## 2021-05-29 NOTE — Progress Notes (Signed)
Cardiology Office Note:    Date:  05/29/2021   ID:  Brandi [name], DOB [birth year], MRN [id number]  PCP:  [facility], [place] Cardiologist:  Nelva Bush, MD  [place] Electrophysiologist:  None   Referring MD: [facility], [place] [facility]*   Chief Complaint: 2 week follow-up  History of Present Illness:    Brandi [name] is a 70 y.o. 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  She was seen by Dr. Rogue Bussing 08/2019 and Revlimid was held in the setting of lower extremity edema.  TTE 07/2019 with preserved LVEF and normal diastolic function.  PASP at least moderately elevated.  During previous myeloma treatment, she had IJ DVT treated with Xarelto.  She was seen by Dr. Saunders Revel 09/26/2019 with 2 to 3 months worsening lower extremity edema and dyspnea, consistently NYHA class IIIb heart failure. She has been seen regularly in clinic for escalation of GDMT. She is now s/p thyroid removal with improvement in her sx associated with her goiter and abnormal thyroid.  At her last office visit, it was noted she had self reduced her diuresis to every other day.  Her diuresis was transiently increased for 3 days for volume overload.  She was instructed to return to daily Lasix at after that time.  CHF education was provided.  Last seen 05/15/21 and was taking lasix 3-4 times a month. Weight was up 139>170lbs. She was started on lasix 17m daily, Toprol, potassium and spironolactone.   Recently admitted for PNA, now on abx.  Today the patient reports she is feeling weak since the PNA admission. She has been taking lasix daily. Weight is going down around 162lbs. Sometimes gets fluid at hospital during cancer treatment. Eating and drinking OK. Reports stable swelling on the  feet. No chest pain, orthopnea, pnd. BP high, but has not had meds today. Needs refill of mtoprolol. BP at home 120--130s/70s.    Past Medical History:  Diagnosis Date   Anxiety    At risk for cardiac dysfunction    CHF (congestive heart failure) (HCC)    COPD (chronic obstructive pulmonary disease) (HCC)    Hypertension    Hyperthyroidism    Hypokalemia    IgA myeloma (HCC)    Multiple myeloma (HCC)    Pneumonia    Red blood cell antibody positive, compatible PRBC difficult to obtain    S/P autologous bone marrow transplantation ([facility]     Past Surgical History:  Procedure Laterality Date   ESOPHAGOGASTRODUODENOSCOPY (EGD) WITH PROPOFOL N/A 03/30/2019   Procedure: ESOPHAGOGASTRODUODENOSCOPY (EGD) WITH PROPOFOL;  Surgeon: AJonathon Bellows MD;  [facility];  Service: Gastroenterology;  Laterality: N/A;   IR FLUORO GUIDE PORT INSERTION RIGHT  12/16/2017   THYROIDECTOMY N/A     Current Medications: Current Meds  Medication Sig   acyclovir (ZOVIRAX) 400 MG tablet Take 1 tablet (400 mg total) by mouth 2 (two) times daily.   albuterol (VENTOLIN HFA) 108 (90 Base) MCG/ACT inhaler Inhale 2 puffs into the lungs every 6 (six) hours as needed for wheezing or shortness of breath.   aspirin EC 81 MG EC tablet Take 1 tablet (81 mg total) by mouth daily.   azithromycin (ZITHROMAX) 500 MG tablet Take 1 tablet (500 mg total) by mouth daily for 2 days. Take 1 tablet daily for 3 days.  benzonatate (TESSALON) 100 MG capsule Take 1 capsule (100 mg total) by mouth 3 (three) times daily as needed for cough.   buPROPion (WELLBUTRIN) 75 MG tablet Take 75 mg by mouth daily.   calcium-vitamin D (OSCAL WITH D) 500MG-200UNIT (5MCG) tablet Take 1 tablet by mouth 2 (two) times daily.   cefdinir (OMNICEF) 300 MG capsule Take 1 capsule (300 mg total) by mouth 2 (two) times daily for 5 days.   chlorpheniramine-HYDROcodone (TUSSIONEX) 10-8 MG/5ML SUER Take 5 mLs by mouth every 12 (twelve) hours as needed  for cough.   dexamethasone (DECADRON) 4 MG tablet Start 2 days prior to infusion; Take for 2 days. Do not take on the day of infusion. (Patient taking differently: Take 4 mg by mouth 2 (two) times daily. Start 2 days prior to infusion; Take for 2 days. Do not take on the day of infusion.)   ergocalciferol (VITAMIN D2) 1.25 MG (50000 UT) capsule Take 1 capsule (50,000 Units total) by mouth once a week.   feeding supplement, ENSURE ENLIVE, (ENSURE ENLIVE) LIQD Take 237 mLs by mouth 2 (two) times daily between meals.   Fluticasone-Salmeterol (ADVAIR DISKUS) 500-50 MCG/DOSE AEPB Inhale 1 puff into the lungs 2 (two) times daily.   furosemide (LASIX) 40 MG tablet Take 1 tablet (40 mg total) by mouth daily.   HYDROcodone-acetaminophen (NORCO) 5-325 MG tablet Take 1 tablet by mouth every 6 (six) hours as needed for moderate pain.   Ipratropium-Albuterol (COMBIVENT) 20-100 MCG/ACT AERS respimat Inhale into the lungs.   levothyroxine (SYNTHROID) 112 MCG tablet Take 112 mcg by mouth daily.   ondansetron (ZOFRAN) 8 MG tablet One pill every 8 hours as needed for nausea/vomitting.   pantoprazole (PROTONIX) 40 MG tablet Take 1 tablet (40 mg total) by mouth daily.   polyethylene glycol (MIRALAX / GLYCOLAX) packet Take 17 g by mouth daily as needed for mild constipation.   potassium chloride SA (KLOR-CON) 20 MEQ tablet Take 1 tablet (20 mEq total) by mouth 2 (two) times daily.   predniSONE (DELTASONE) 10 MG tablet Take 4 tablets (40 mg total) by mouth daily for 3 days, THEN 2 tablets (20 mg total) daily for 3 days, THEN 1 tablet (10 mg total) daily for 3 days.   spironolactone (ALDACTONE) 25 MG tablet Take 1 tablet by mouth once daily   [DISCONTINUED] metoprolol succinate (TOPROL-XL) 25 MG 24 hr tablet TAKE 1 & 1/2 (ONE & ONE-HALF) TABLETS BY MOUTH ONCE DAILY     Allergies:   Patient has no known allergies.   Social History   Socioeconomic History   Marital status: Single    Spouse name: Not on file   Number  of children: Not on file   Years of education: Not on file   Highest education level: Not on file  Occupational History   Not on file  Tobacco Use   Smoking status: Former    Packs/day: 0.25    Types: Cigarettes    Quit date: 03/18/2021    Years since quitting: 0.1   Smokeless tobacco: Never   Tobacco comments:    2 cigarettes QOD  Vaping Use   Vaping Use: Never used  Substance and Sexual Activity   Alcohol use: No   Drug use: No   Sexual activity: Not on file  Other Topics Concern   Not on file  Social History Narrative   Lives at home with family. Independent at baseline   Social Determinants of Health   Financial Resource Strain: Not on file  Food Insecurity: Not on file  Transportation Needs: Not on file  Physical Activity: Not on file  Stress: Not on file  Social Connections: Not on file     Family History: The patient's family history includes Pneumonia in her mother; Seizures in her father.  ROS:   Please see the history of present illness.     All other systems reviewed and are negative.  EKGs/Labs/Other Studies Reviewed:    The following studies were reviewed today: Echo 08/20/19 1. Left ventricular ejection fraction, by visual estimation, is 60 to  65%. The left ventricle has normal function. There is no left ventricular  hypertrophy.   2. The left ventricle has no regional wall motion abnormalities.   3. Global right ventricle has normal systolic function.The right  ventricular size is normal. No increase in right ventricular wall  thickness.   4. Left atrial size was normal.   5. Right atrial size was normal.   6. The mitral valve is normal in structure. Trivial mitral valve  regurgitation.   7. The tricuspid valve is normal in structure.   8. The tricuspid valve is normal in structure. Tricuspid valve  regurgitation is trivial.   9. The aortic valve is normal in structure. Aortic valve regurgitation is  trivial.  10. The pulmonic valve was  normal in structure. Pulmonic valve  regurgitation is not visualized.  11. Moderately elevated pulmonary artery systolic pressure.    EKG:  EKG is  ordered today.  The ekg ordered today demonstrates SB 55bpm, nonspecific T wave changes  Recent Labs: 05/24/2021: B Natriuretic Peptide 123.0 05/27/2021: ALT 29; BUN 24; Creatinine, Ser 0.84; Hemoglobin 9.6; Magnesium 2.1; Platelets 262; Potassium 4.1; Sodium 139; TSH 2.285  Recent Lipid Panel No results found for: CHOL, TRIG, HDL, CHOLHDL, VLDL, LDLCALC, LDLDIRECT    Physical Exam:    VS:  BP (!) 150/82 (BP Location: Left Arm, Patient Position: Sitting, Cuff Size: Normal)   Pulse (!) 55   Ht _0  (1.575 m)   Wt 168 lb (76.2 kg)   SpO2 98%   BMI 30.73 kg/m     Wt Readings from Last 3 Encounters:  05/29/21 168 lb (76.2 kg)  05/24/21 170 lb (77.1 kg)  05/20/21 171 lb (77.6 kg)     GEN:  Well nourished, well developed in no acute distress HEENT: Normal NECK: No JVD; No carotid bruits LYMPHATICS: No lymphadenopathy CARDIAC: RRR, no murmurs, rubs, gallops RESPIRATORY:  Clear to auscultation without rales, wheezing or rhonchi  ABDOMEN: Soft, non-tender, non-distended MUSCULOSKELETAL:  No edema; No deformity  SKIN: Warm and dry NEUROLOGIC:  Alert and oriented x 3 PSYCHIATRIC:  Normal affect   ASSESSMENT:    1. Chronic heart failure with preserved ejection fraction (HFpEF) (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 Chronic obstructive pulmonary disease, unspecified COPD type ([place])   4. Multiple myeloma, remission status unspecified (HCC)    PLAN:    In order of problems listed above:  HFpEF She has been taking lasix 40 mg daily. Recent hospitalization for PNA. Appears euvolemic on exam. Says she occasionally gets IVF at the cancer center. Will check BMET and BNP today. No change in meds at this time. Continue Toprol and spironolactone. Prior echo 07/2019 showed LVEF 60-65%, no LVH, no WMA, trivial MR.  HTN BP high, but has  not had medications today. Needs refill of Toprol. Continue spironolactone.   Multiple Myeloma/anemia Follows with oncology.  COPD Recent PNA She is on abx treatment.  Lungs sound clear on exam today.    Disposition: Follow up in 1 month(s) with MD/APP    Signed, Korinne Greenstein Ninfa Meeker, PA-C  05/29/2021 11:53 AM    [facility]

## 2021-05-29 NOTE — Telephone Encounter (Signed)
Able to reach pt regarding her recent lab work, [employer], [employer] had a chance to review their results and advised   "Labs show normal BNP (heart failure value). BMET is unremarkable. No changes "  All questions were address and no additional concerns at this time. Pt thankful for call.

## 2021-05-29 NOTE — Patient Instructions (Signed)
Medication Instructions:  No changes at this time.  *If you need a refill on your cardiac medications before your next appointment, please call your pharmacy*   Lab Work: BMET & BNP today  If you have labs (blood work) drawn today and your tests are completely normal, you will receive your results only by: [place] (if you have MyChart) OR A paper copy in the mail If you have any lab test that is abnormal or we need to change your treatment, we will call you to review the results.   Testing/Procedures: None   Follow-Up: At [facility], you and your health needs are our priority.  As part of our continuing mission to provide you with exceptional heart care, we have created designated Provider Care Teams.  These Care Teams include your primary Cardiologist (physician) and Advanced Practice Providers (APPs -  Physician Assistants and Nurse Practitioners) who all work together to provide you with the care you need, when you need it.  We recommend signing up for the patient portal called "MyChart".  Sign up information is provided on this After Visit Summary.  MyChart is used to connect with patients for Virtual Visits (Telemedicine).  Patients are able to view lab/test results, encounter notes, upcoming appointments, etc.  Non-urgent messages can be sent to your provider as well.   To learn more about what you can do with MyChart, go to [email or web address].    Your next appointment:   4-6 week(s)  The format for your next appointment:   In Person  Provider:   You may see Nelva Bush, MD or one of the following Advanced Practice Providers on your designated Care Team:   Murray Hodgkins, NP Christell Faith, PA-C Cadence Kathlen Mody, [place]

## 2021-06-03 ENCOUNTER — Ambulatory Visit: Payer: Medicare Other | Admitting: Internal Medicine

## 2021-06-03 ENCOUNTER — Other Ambulatory Visit: Payer: Medicare Other

## 2021-06-11 ENCOUNTER — Other Ambulatory Visit: Payer: Self-pay

## 2021-06-11 ENCOUNTER — Encounter: Payer: Self-pay | Admitting: Internal Medicine

## 2021-06-11 ENCOUNTER — Inpatient Hospital Stay: Payer: Medicare Other | Attending: Internal Medicine

## 2021-06-11 ENCOUNTER — Inpatient Hospital Stay (HOSPITAL_BASED_OUTPATIENT_CLINIC_OR_DEPARTMENT_OTHER): Payer: Medicare Other | Admitting: Internal Medicine

## 2021-06-11 VITALS — BP 125/68 | HR 50 | Temp 99.3°F | Resp 16 | Wt 170.0 lb

## 2021-06-11 DIAGNOSIS — E032 Hypothyroidism due to medicaments and other exogenous substances: Secondary | ICD-10-CM

## 2021-06-11 DIAGNOSIS — D649 Anemia, unspecified: Secondary | ICD-10-CM

## 2021-06-11 DIAGNOSIS — C9000 Multiple myeloma not having achieved remission: Secondary | ICD-10-CM

## 2021-06-11 DIAGNOSIS — C9002 Multiple myeloma in relapse: Secondary | ICD-10-CM | POA: Diagnosis present

## 2021-06-11 LAB — COMPREHENSIVE METABOLIC PANEL
ALT: 15 U/L (ref 0–44)
AST: 19 U/L (ref 15–41)
Albumin: 4.1 g/dL (ref 3.5–5.0)
Alkaline Phosphatase: 102 U/L (ref 38–126)
Anion gap: 9 (ref 5–15)
BUN: 19 mg/dL (ref 8–23)
CO2: 26 mmol/L (ref 22–32)
Calcium: 8.2 mg/dL — ABNORMAL LOW (ref 8.9–10.3)
Chloride: 103 mmol/L (ref 98–111)
Creatinine, Ser: 1.1 mg/dL — ABNORMAL HIGH (ref 0.44–1.00)
GFR, Estimated: 54 mL/min — ABNORMAL LOW (ref >60)
Glucose, Bld: 72 mg/dL (ref 70–99)
Potassium: 3.8 mmol/L (ref 3.5–5.1)
Sodium: 138 mmol/L (ref 135–145)
Total Bilirubin: 0.5 mg/dL (ref 0.3–1.2)
Total Protein: 7.9 g/dL (ref 6.5–8.1)

## 2021-06-11 LAB — CBC WITH DIFFERENTIAL/PLATELET
Abs Immature Granulocytes: 0.01 10*3/uL (ref 0.00–0.07)
Basophils Absolute: 0 10*3/uL (ref 0.0–0.1)
Basophils Relative: 1 %
Eosinophils Absolute: 0.1 10*3/uL (ref 0.0–0.5)
Eosinophils Relative: 2 %
HCT: 36.1 % (ref 36.0–46.0)
Hemoglobin: 11.1 g/dL — ABNORMAL LOW (ref 12.0–15.0)
Immature Granulocytes: 0 %
Lymphocytes Relative: 46 %
Lymphs Abs: 2.2 10*3/uL (ref 0.7–4.0)
MCH: 27.8 pg (ref 26.0–34.0)
MCHC: 30.7 g/dL (ref 30.0–36.0)
MCV: 90.3 fL (ref 80.0–100.0)
Monocytes Absolute: 0.3 10*3/uL (ref 0.1–1.0)
Monocytes Relative: 5 %
Neutro Abs: 2.2 10*3/uL (ref 1.7–7.7)
Neutrophils Relative %: 46 %
Platelets: 214 10*3/uL (ref 150–400)
RBC: 4 MIL/uL (ref 3.87–5.11)
RDW: 16.8 % — ABNORMAL HIGH (ref 11.5–15.5)
WBC: 4.9 10*3/uL (ref 4.0–10.5)
nRBC: 0 % (ref 0.0–0.2)

## 2021-06-11 NOTE — Progress Notes (Signed)
[facility] OFFICE PROGRESS NOTE  Patient Care Team: [facility], [place] as PCP - General (General Practice) End, Harrell Gave, MD as PCP - Cardiology (Cardiology) [name], [name] [name], MD as Consulting Physician (Hematology and Oncology) Ottie Glazier, MD as Consulting Physician (Pulmonary Disease) Gabriel Carina Betsey Holiday, MD as Consulting Physician (Endocrinology)   Cancer Staging  No matching staging information was found for the patient.   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 No morphologic support for residual myeloma disease. Negative for minimal residual disease by MM-MRD flow cytometry; HOLD REVLIMID [leg swelling]; AUG 2021-PET scan negative for myeloma; continue to hold Revlimid  # MARCH 2021-diastolic congestive heart failure [Dr.End]  # BMBx- OCT 2021- BMBx- 5% plasmacytosis-however this appears to be more polyclonal rather than monoclonal-not explain patient worsening anemia.   # OCT 2022- 18th- Dara- Rev-Dex  #November  2021-hyperthyroidism/goiter- [D.Bennett/Dr.Solum]-methimazole. S/p Thyroidectomy [Dr.Kim; [facility]] --------------------------  # 12/12- RIGHT JUGULAR DVT-x 334mn xarelto; finished April 2020; September 2020-EGD/dysphagia; Dr. AnVicente Males# Acute renal failure [Dr.Singh; Proteinuria 1.5gm/day ]; acyclovir/Asprin ------------------------------------------------------------------------------------------------------------   DIAGNOSIS: '[ ]'  MULTIPLE MYELOMA  STAGE: III/HIGH RISK ;GOALS: CONTROL  CURRENT/MOST RECENT THERAPY-maintenance Revlimid- on HOLD    Multiple myeloma ([facility] 11/21/2017 - 04/28/2018 Chemotherapy   Patient is on Treatment Plan : MYELOMA SALVAGE Cyclophosphamide / Carfilzomib / Dexamethasone (CCd) q28d     05/12/2021 -  Chemotherapy   Patient is on Treatment Plan : MYELOMA RELAPSED REFRACTORY Daratumumab SQ + Lenalidomide + Dexamethasone (DaraRd) q28d      INTERVAL HISTORY: Ambulating independently.  Alone.. Brandi [id number].  female pleasant patient above history of relapsed multiple myeloma--currently on daratumumab-Revlimid-dexamethasone [s/p 2 infusions of Dara] is here for follow-up.  In the interim patient was admitted to the hospital for pneumonia.  Patient also incidentally noted to have abdominal wall hematoma.  Patient s/p antibiotics.  Obviously the treatment was interrupted because of hospitalizations.  Patient is currently home.  Denies any worsening shortness of breath or cough.  No fever no chills.  She feels back to baseline. Review of Systems  Constitutional:  Positive for malaise/fatigue. Negative for chills, diaphoresis and fever.  HENT:  Negative for nosebleeds and sore throat.   Eyes:  Negative for double vision.  Respiratory:  Negative for hemoptysis.   Cardiovascular:  Negative for chest pain, palpitations and orthopnea.  Gastrointestinal:  Negative for abdominal pain, blood in stool, constipation, heartburn and melena.  Genitourinary:   Negative for dysuria, frequency and urgency.  Musculoskeletal:  Positive for back pain.  Skin: Negative.  Negative for itching and rash.  Neurological:  Negative for dizziness, tingling, focal weakness, weakness and headaches.  Endo/Heme/Allergies:  Does not bruise/bleed easily.  Psychiatric/Behavioral:  Negative for depression. The patient is not nervous/anxious.    PAST MEDICAL HISTORY :  Past Medical History:  Diagnosis Date   Anxiety    At risk for cardiac dysfunction    CHF (congestive heart failure) (HCC)    COPD (chronic obstructive pulmonary disease) (HCC)    Hypertension    Hyperthyroidism    Hypokalemia    IgA myeloma (HCC)    Multiple myeloma (HCC)    Pneumonia    Red blood cell antibody positive, compatible PRBC difficult to obtain    S/P autologous bone marrow transplantation ([place])     PAST SURGICAL HISTORY :   Past Surgical History:  Procedure Laterality Date   ESOPHAGOGASTRODUODENOSCOPY (EGD) WITH PROPOFOL N/A 03/30/2019   Procedure: ESOPHAGOGASTRODUODENOSCOPY (EGD) WITH PROPOFOL;  Surgeon: Jonathon Bellows, MD;  Location[facility] ENDOSCOPY;  Service: Gastroenterology;  Laterality: N/A;   IR FLUORO GUIDE PORT INSERTION RIGHT  12/16/2017   THYROIDECTOMY N/A     FAMILY HISTORY :   Family History  Problem Relation Age of Onset   Pneumonia Mother    Seizures Father     SOCIAL HISTORY:   Social History   Tobacco Use   Smoking status: Former    Packs/day: 0.25    Types: Cigarettes    Quit date: 03/18/2021    Years since quitting: 0.2   Smokeless tobacco: Never   Tobacco comments:    2 cigarettes QOD  Vaping Use   Vaping Use: Never used  Substance Use Topics   Alcohol use: No   Drug use: No    ALLERGIES:  has No Known Allergies.  MEDICATIONS:  Current Outpatient Medications  Medication Sig Dispense Refill   acyclovir (ZOVIRAX) 400 MG tablet Take 1 tablet (400 mg total) by mouth 2 (two) times daily. 60 tablet 3   albuterol (VENTOLIN HFA) 108 (90 Base)  MCG/ACT inhaler Inhale 2 puffs into the lungs every 6 (six) hours as needed for wheezing or shortness of breath. 8 g 3   aspirin EC 81 MG EC tablet Take 1 tablet (81 mg total) by mouth daily. 90 tablet 3   benzonatate (TESSALON) 100 MG capsule Take 1 capsule (100 mg total) by mouth 3 (three) times daily as needed for cough. 20 capsule 0   buPROPion (WELLBUTRIN) 75 MG tablet Take 75 mg by mouth daily.     calcium-vitamin D (OSCAL WITH D) 500MG-200UNIT (5MCG) tablet Take 1 tablet by mouth 2 (two) times daily. 60 tablet 0   chlorpheniramine-HYDROcodone (TUSSIONEX) 10-8 MG/5ML SUER Take 5 mLs by mouth every 12 (twelve) hours as needed for cough. 140 mL 0   dexamethasone (DECADRON) 4 MG tablet Start 2 days prior to infusion; Take for 2 days. Do not take on the day of infusion. (Patient taking differently: Take 4 mg by mouth 2 (two) times daily. Start 2 days prior to infusion; Take for 2 days. Do not take on the day of infusion.) 60 tablet 3   ergocalciferol (VITAMIN D2) 1.25 MG (50000 UT) capsule Take 1 capsule (50,000 Units total) by mouth once a week. 12 capsule 2   feeding supplement, ENSURE ENLIVE, (ENSURE ENLIVE) LIQD Take 237 mLs by mouth 2 (two) times daily between meals. 60 Bottle 0   furosemide (LASIX) 40 MG tablet Take 1 tablet (40 mg total) by mouth daily. 90 tablet 3   Ipratropium-Albuterol (COMBIVENT) 20-100 MCG/ACT AERS respimat Inhale into the lungs.     levothyroxine (SYNTHROID) 112 MCG tablet Take 112 mcg by mouth daily.  metoprolol succinate (TOPROL-XL) 25 MG 24 hr tablet Take 1.5 tablets (37.5 mg total) by mouth daily. TAKE 1 & 1/2 (ONE & ONE-HALF) TABLETS BY MOUTH ONCE DAILY 135 tablet 0   pantoprazole (PROTONIX) 40 MG tablet Take 1 tablet (40 mg total) by mouth daily. 90 tablet 1   polyethylene glycol (MIRALAX / GLYCOLAX) packet Take 17 g by mouth daily as needed for mild constipation. 14 each 0   potassium chloride SA (KLOR-CON) 20 MEQ tablet Take 1 tablet (20 mEq total) by mouth 2  (two) times daily. 60 tablet 6   spironolactone (ALDACTONE) 25 MG tablet Take 1 tablet by mouth once daily 90 tablet 0   ondansetron (ZOFRAN) 8 MG tablet One pill every 8 hours as needed for nausea/vomitting. (Patient not taking: Reported on 06/11/2021) 40 tablet 1   No current facility-administered medications for this visit.   Facility-Administered Medications Ordered in Other Visits  Medication Dose Route Frequency Provider Last Rate Last Admin   0.9 %  sodium chloride infusion   Intravenous Continuous Monia Sabal, PA-C        PHYSICAL EXAMINATION: ECOG PERFORMANCE STATUS: 1 - Symptomatic but completely ambulatory  BP 125/68   Pulse (!) 50   Temp 99.3 F (37.4 C) (Tympanic)   Resp 16   Wt 170 lb (77.1 kg)   BMI 31.09 kg/m   Filed Weights   06/11/21 1038  Weight: 170 lb (77.1 kg)    Physical Exam HENT:     Head: Normocephalic and atraumatic.  Eyes:     Pupils: Pupils are equal, round, and reactive to light.  Cardiovascular:     Rate and Rhythm: Normal rate and regular rhythm.  Pulmonary:     Effort: Pulmonary effort is normal. No respiratory distress.     Breath sounds: Normal breath sounds. No wheezing.  Abdominal:     General: Bowel sounds are normal. There is no distension.     Palpations: Abdomen is soft. There is no mass.     Tenderness: There is no abdominal tenderness. There is no guarding or rebound.  Musculoskeletal:        General: No tenderness. Normal range of motion.     Cervical back: Normal range of motion and neck supple.  Skin:    General: Skin is warm.  Neurological:     Mental Status: She is alert and oriented to person, place, and time.  Psychiatric:        Mood and Affect: Affect normal.   LABORATORY DATA:  I have reviewed the data as listed    Component Value Date/Time   NA 138 06/11/2021 0942   NA 141 01/25/2020 1530   K 3.8 06/11/2021 0942   CL 103 06/11/2021 0942   CO2 26 06/11/2021 0942   GLUCOSE 72 06/11/2021 0942   BUN 19  06/11/2021 0942   BUN 19 01/25/2020 1530   CREATININE 1.10 (H) 06/11/2021 0942   CALCIUM 8.2 (L) 06/11/2021 0942   PROT 7.9 06/11/2021 0942   ALBUMIN 4.1 06/11/2021 0942   AST 19 06/11/2021 0942   ALT 15 06/11/2021 0942   ALKPHOS 102 06/11/2021 0942   BILITOT 0.5 06/11/2021 0942   GFRNONAA 54 (L) 06/11/2021 0942   GFRAA NOT CALCULATED 04/17/2020 1352    No results found for: SPEP, UPEP  Lab Results  Component Value Date   WBC 4.9 06/11/2021   NEUTROABS 2.2 06/11/2021   HGB 11.1 (L) 06/11/2021   HCT 36.1 06/11/2021   MCV 90.3 06/11/2021  PLT 214 06/11/2021      Chemistry      Component Value Date/Time   NA 138 06/11/2021 0942   NA 141 01/25/2020 1530   K 3.8 06/11/2021 0942   CL 103 06/11/2021 0942   CO2 26 06/11/2021 0942   BUN 19 06/11/2021 0942   BUN 19 01/25/2020 1530   CREATININE 1.10 (H) 06/11/2021 0942      Component Value Date/Time   CALCIUM 8.2 (L) 06/11/2021 0942   ALKPHOS 102 06/11/2021 0942   AST 19 06/11/2021 0942   ALT 15 06/11/2021 0942   BILITOT 0.5 06/11/2021 0942     Results for KIMANH, TEMPLEMAN (MRN [id number]) as of 04/28/2021 16:46  Ref. Range 10/24/2020 13:16 12/24/2020 14:01 01/14/2021 14:13 04/01/2021 14:34 04/28/2021 09:17  Kappa free light chain Latest Ref Range: 3.3 - 19.4 mg/L 23.1 (H) 16.8  12.4   Lambda free light chains Latest Ref Range: 5.7 - 26.3 mg/L 36.6 (H) 41.1 (H)  156.5 (H)   Kappa, lambda light chain ratio Latest Ref Range: 0.26 - 1.65  0.63 0.41  0.08 (L)      RADIOGRAPHIC STUDIES: I have personally reviewed the radiological images as listed and agreed with the findings in the report. No results found.   ASSESSMENT & PLAN:  Multiple myeloma ([place]) # RECURRENT Multiple myeloma stage III [high-risk cytogenetics-status post KRD-VGPR ; status post autologous stem cell transplant on 06/15/18. -OCT 2022 M protein-0.7 g/dL.  Kappa/lambda light chain ratio 0.08.  #Patient currently salvage therapy with daratumumab; lenalidomide [15  mg 3 w; 1-w] dexamethasone. However therapy interrupted for recent hospitalization/pneumonia see below.   #Continue to hold Dara SQ today.  Given patient's abdominal hematoma [? SQ dara injections]-we will switch over to IV daratumumab.  Discussed with patient; would recommend port placement.  For now we will continue-lenalidomide 2 more weeks.  Patient will need a new refill/for next visit.  # Abdominal wall hematoma-noted on CT scan [November 2022]-switch to IV dara-plan to start July 2023.  # Hyperthyroidism-[place]/goiter s/p total thyroidectomy [Dr.Kim-[facility]; [place].H[facility]kin ]- STABLE; check thyroid profile.  #Chronic diastolic CHF-clinically stable at this time.  # Bone lesions /last zometa on 11/27/2017.  Holding Zometa secondary to poor dentition.  #  COVID EVUSHELD PROPHYLAXIS: Given the immunosuppressive effects of treatments I would recommend EVUSHELD prophylaxis.  IM injection x2 [same time]-cutdown risk of Covid infection/next 6 months. Patient is vaccinated to [place]. Sent messg to [facility].  Also on shingles prophylaxis.  # DISPOSITION:  # refer to IR for port placement #  Follow up 3  Weeks-- MD; labs-cbc//cmp;:MM panle; K/l light chain ratio-Dr.B      Orders Placed This Encounter  Procedures   IR IMAGING GUIDED PORT INSERTION    Standing Status:   Future    Standing Expiration Date:   06/11/2022    Order Specific Question:   Reason for Exam (SYMPTOM  OR DIAGNOSIS REQUIRED)    Answer:   IGA myeloma    Order Specific Question:   Preferred Imaging Location?    Answer:   [facility]    All questions were answered. The patient knows to call the clinic with any problems, questions or concerns.      [name], MD 06/12/2021 5:55 AM

## 2021-06-11 NOTE — Assessment & Plan Note (Addendum)
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 -OCT 2022 M protein-0.7 g/dL.  Kappa/lambda light chain ratio 0.08.  #Patient currently salvage therapy with daratumumab; lenalidomide [15 mg 3 w; 1-w] dexamethasone. However therapy interrupted for recent hospitalization/pneumonia see below.   #Continue to hold Dara SQ today.  Given patient's abdominal hematoma [? SQ dara injections]-we will switch over to IV daratumumab.  Discussed with patient; would recommend port placement.  For now we will continue-lenalidomide 2 more weeks.  Patient will need a new refill/for next visit.  # Abdominal wall hematoma-noted on CT scan [November 2022]-switch to IV dara-plan to start July 2023.  # Hyperthyroidism-[place]/goiter s/p total thyroidectomy [Dr.Kim-[facility]; [place].Hodgkin ]- STABLE; check thyroid profile.  #Chronic diastolic CHF-clinically stable at this time.  # Bone lesions /last zometa on 11/27/2017.  Holding Zometa secondary to poor dentition.  #  COVID EVUSHELD PROPHYLAXIS: Given the immunosuppressive effects of treatments I would recommend EVUSHELD prophylaxis.  IM injection x2 [same time]-cutdown risk of Covid infection/next 6 months. Patient is vaccinated to [place]. Sent messg to [facility].  Also on shingles prophylaxis.  # DISPOSITION:  # refer to IR for port placement #  Follow up 3  Weeks-- MD; labs-cbc//cmp;:MM panle; K/l light chain ratio-Dr.B

## 2021-06-11 NOTE — Progress Notes (Signed)
Patient on schedule for Port placement 06/15/2021, called and spoke with patient on phone with pre procedure instructions given. Made aware to be here @ 0900, NPO after MN prior to procedure as well as driver post procedure/recovery/discharge. Stated understanding.

## 2021-06-11 NOTE — Progress Notes (Signed)
Sometimes gets cramps over her body and last few min.

## 2021-06-12 ENCOUNTER — Encounter: Payer: Self-pay | Admitting: Internal Medicine

## 2021-06-12 ENCOUNTER — Other Ambulatory Visit: Payer: Self-pay | Admitting: Student

## 2021-06-12 LAB — KAPPA/LAMBDA LIGHT CHAINS
Kappa free light chain: 7 mg/L (ref 3.3–19.4)
Kappa, lambda light chain ratio: 0.05 — ABNORMAL LOW (ref 0.26–1.65)
Lambda free light chains: 142.2 mg/L — ABNORMAL HIGH (ref 5.7–26.3)

## 2021-06-15 ENCOUNTER — Other Ambulatory Visit: Payer: Self-pay

## 2021-06-15 ENCOUNTER — Encounter: Payer: Self-pay | Admitting: Radiology

## 2021-06-15 ENCOUNTER — Ambulatory Visit
Admission: RE | Admit: 2021-06-15 | Discharge: 2021-06-15 | Disposition: A | Discharge status: Home / Self Care | Payer: Medicare Other | Source: Ambulatory Visit | Attending: Internal Medicine | Admitting: Internal Medicine

## 2021-06-15 DIAGNOSIS — C9000 Multiple myeloma not having achieved remission: Secondary | ICD-10-CM | POA: Insufficient documentation

## 2021-06-15 DIAGNOSIS — Z87891 Personal history of nicotine dependence: Secondary | ICD-10-CM | POA: Diagnosis not present

## 2021-06-15 DIAGNOSIS — Z01818 Encounter for other preprocedural examination: Secondary | ICD-10-CM | POA: Diagnosis present

## 2021-06-15 HISTORY — PX: IR IMAGING GUIDED PORT INSERTION: IMG5740

## 2021-06-15 LAB — MULTIPLE MYELOMA PANEL, SERUM
Albumin SerPl Elph-Mcnc: 3.7 g/dL (ref 2.9–4.4)
Albumin/Glob SerPl: 1.1 (ref 0.7–1.7)
Alpha 1: 0.2 g/dL (ref 0.0–0.4)
Alpha2 Glob SerPl Elph-Mcnc: 0.7 g/dL (ref 0.4–1.0)
B-Globulin SerPl Elph-Mcnc: 1.6 g/dL — ABNORMAL HIGH (ref 0.7–1.3)
Gamma Glob SerPl Elph-Mcnc: 1.1 g/dL (ref 0.4–1.8)
Globulin, Total: 3.5 g/dL (ref 2.2–3.9)
IgA: 683 mg/dL — ABNORMAL HIGH (ref 87–352)
IgG (Immunoglobin G), Serum: 582 mg/dL — ABNORMAL LOW (ref 586–1602)
IgM (Immunoglobulin M), Srm: 18 mg/dL — ABNORMAL LOW (ref 26–217)
M Protein SerPl Elph-Mcnc: 0.5 g/dL — ABNORMAL HIGH
Total Protein ELP: 7.2 g/dL (ref 6.0–8.5)

## 2021-06-15 MED ORDER — FENTANYL CITRATE (PF) 100 MCG/2ML IJ SOLN
INTRAMUSCULAR | Status: DC | PRN
  Administered 2021-06-15: 50 ug via INTRAVENOUS

## 2021-06-15 MED ORDER — MIDAZOLAM HCL 2 MG/2ML IJ SOLN
INTRAMUSCULAR | Status: DC | PRN
  Administered 2021-06-15: 1 mg via INTRAVENOUS

## 2021-06-15 MED ORDER — LIDOCAINE-EPINEPHRINE 1 %-1:100000 IJ SOLN
INTRAMUSCULAR | Status: AC
  Administered 2021-06-15: 10 mL
  Filled 2021-06-15: qty 1

## 2021-06-15 MED ORDER — SODIUM CHLORIDE 0.9 % IV SOLN
INTRAVENOUS | Status: DC
  Filled 2021-06-15: qty 1000

## 2021-06-15 MED ORDER — MIDAZOLAM HCL 2 MG/2ML IJ SOLN
INTRAMUSCULAR | Status: AC
  Filled 2021-06-15: qty 2

## 2021-06-15 MED ORDER — HEPARIN SOD (PORK) LOCK FLUSH 100 UNIT/ML IV SOLN
INTRAVENOUS | Status: AC
  Administered 2021-06-15: 500 [IU]
  Filled 2021-06-15: qty 5

## 2021-06-15 MED ORDER — FENTANYL CITRATE (PF) 100 MCG/2ML IJ SOLN
INTRAMUSCULAR | Status: AC
  Filled 2021-06-15: qty 2

## 2021-06-15 NOTE — Procedures (Signed)
Interventional Radiology Procedure Note  Date of Procedure: 06/15/2021  Procedure: Port placement   Findings:  1. Port placement right chest via right EJ    Complications: No immediate complications noted.   Estimated Blood Loss: minimal  Follow-up and Recommendations: 1. Ready for use   Albin Felling, MD  Vascular & Interventional Radiology  06/15/2021 11:17 AM

## 2021-06-15 NOTE — H&P (Signed)
Chief Complaint: Patient was seen in consultation today for recurrent multiple myeloma at the request of [name],[name]  Referring Physician(s): [name]  Supervising Physician: Juliet Rude  Patient Status: [place] - Out-pt  History of Present Illness: Brandi [name] is a 70 y.o. female with recurrent multiple myeloma who follows with Oncology and was seen 11/17 without medical changes since this visit. The patient has been scheduled for an elective image guided port a catheter placement today for chemotherapy. She has had a previous right sided IJ port in 2019 and has previous tolerated moderate sedation.   The patient has had a H&P performed within the last 30 days, all history, medications, and exam have been reviewed. The patient denies any interval changes since the H&P.  The patient denies any chest pain or shortness of breath and states her breathing is at baseline from recovering from pneumonia. The patient denies any recent fever or chills. The patient denies any history of sleep apnea or chronic oxygen use. She has no known complications to sedation.    Past Medical History:  Diagnosis Date   Anxiety    At risk for cardiac dysfunction    CHF (congestive heart failure) (HCC)    COPD (chronic obstructive pulmonary disease) (HCC)    Hypertension    Hyperthyroidism    Hypokalemia    IgA myeloma (HCC)    Multiple myeloma (HCC)    Pneumonia    Red blood cell antibody positive, compatible PRBC difficult to obtain    S/P autologous bone marrow transplantation [facility])     Past Surgical History:  Procedure Laterality Date   ESOPHAGOGASTRODUODENOSCOPY (EGD) WITH PROPOFOL N/A 03/30/2019   Procedure: ESOPHAGOGASTRODUODENOSCOPY (EGD) WITH PROPOFOL;  Surgeon: Jonathon Bellows, MD;  Location: [facility] ENDOSCOPY;  Service: Gastroenterology;  Laterality: N/A;   IR FLUORO GUIDE PORT INSERTION RIGHT  12/16/2017   THYROIDECTOMY N/A    Allergies: Patient has no known  allergies.  Medications: Prior to Admission medications   Medication Sig Start Date End Date Taking? Authorizing Provider  acyclovir (ZOVIRAX) 400 MG tablet Take 1 tablet (400 mg total) by mouth 2 (two) times daily. 04/28/21  Yes [name], MD  albuterol (VENTOLIN HFA) 108 (90 Base) MCG/ACT inhaler Inhale 2 puffs into the lungs every 6 (six) hours as needed for wheezing or shortness of breath. 04/28/21  Yes [name], MD  aspirin EC 81 MG EC tablet Take 1 tablet (81 mg total) by mouth daily. 01/25/20  Yes Loel Dubonnet, NP  buPROPion (WELLBUTRIN) 75 MG tablet Take 75 mg by mouth daily. 03/24/21  Yes [provider]  calcium-vitamin D (OSCAL WITH D) 500MG-200UNIT (5MCG) tablet Take 1 tablet by mouth 2 (two) times daily. 05/18/21  Yes Borders, Kirt Boys, NP  furosemide (LASIX) 40 MG tablet Take 1 tablet (40 mg total) by mouth daily. 11/12/19  Yes Loel Dubonnet, NP  levothyroxine (SYNTHROID) 112 MCG tablet Take 112 mcg by mouth daily. 03/23/21  Yes [provider]  metoprolol succinate (TOPROL-XL) 25 MG 24 hr tablet Take 1.5 tablets (37.5 mg total) by mouth daily. TAKE 1 & 1/2 (ONE & ONE-HALF) TABLETS BY MOUTH ONCE DAILY 05/29/21 08/27/21 Yes Furth, Cadence H, PA-C  pantoprazole (PROTONIX) 40 MG tablet Take 1 tablet (40 mg total) by mouth daily. 10/27/20  Yes [name], MD  polyethylene glycol (MIRALAX / GLYCOLAX) packet Take 17 g by mouth daily as needed for mild constipation. 04/11/17  Yes Nicholes Mango, MD  potassium  chloride SA (KLOR-CON) 20 MEQ tablet Take 1 tablet (20 mEq total) by mouth 2 (two) times daily. 04/28/21  Yes [name], MD  spironolactone (ALDACTONE) 25 MG tablet Take 1 tablet by mouth once daily 05/11/21  Yes Visser, Jacquelyn D, PA-C  benzonatate (TESSALON) 100 MG capsule Take 1 capsule (100 mg total) by mouth 3 (three) times daily as needed for cough. 05/22/21   Borders, Kirt Boys, NP  chlorpheniramine-HYDROcodone  (TUSSIONEX) 10-8 MG/5ML SUER Take 5 mLs by mouth every 12 (twelve) hours as needed for cough. 05/22/21   [name], MD  dexamethasone (DECADRON) 4 MG tablet Start 2 days prior to infusion; Take for 2 days. Do not take on the day of infusion. Patient taking differently: Take 4 mg by mouth 2 (two) times daily. Start 2 days prior to infusion; Take for 2 days. Do not take on the day of infusion. 04/28/21   [name], MD  ergocalciferol (VITAMIN D2) 1.25 MG (50000 UT) capsule Take 1 capsule (50,000 Units total) by mouth once a week. 06/25/20   [name], MD  feeding supplement, ENSURE ENLIVE, (ENSURE ENLIVE) LIQD Take 237 mLs by mouth 2 (two) times daily between meals. 04/12/17   Nicholes Mango, MD  Ipratropium-Albuterol (COMBIVENT) 20-100 MCG/ACT AERS respimat Inhale into the lungs. 03/13/20 06/11/21  [provider]  ondansetron (ZOFRAN) 8 MG tablet One pill every 8 hours as needed for nausea/vomitting. Patient not taking: Reported on 06/11/2021 04/28/21   [name], MD     Family History  Problem Relation Age of Onset   Pneumonia Mother    Seizures Father     Social History   Socioeconomic History   Marital status: Single    Spouse name: Not on file   Number of children: 3   Years of education: Not on file   Highest education level: Not on file  Occupational History   Not on file  Tobacco Use   Smoking status: Former    Packs/day: 0.25    Types: Cigarettes    Quit date: 03/18/2021    Years since quitting: 0.2   Smokeless tobacco: Never   Tobacco comments:    2 cigarettes QOD  Vaping Use   Vaping Use: Never used  Substance and Sexual Activity   Alcohol use: No   Drug use: No   Sexual activity: Not on file  Other Topics Concern   Not on file  Social History Narrative   Lives at home with family(son): daughter 8 blocks away. States "I'm never alone." Independent at baseline   Social Determinants of Radio broadcast assistant  Strain: Not on file  Food Insecurity: Not on file  Transportation Needs: Not on file  Physical Activity: Not on file  Stress: Not on file  Social Connections: Not on file    Review of Systems: A 12 point ROS discussed and pertinent positives are indicated in the HPI above.  All other systems are negative.  Review of Systems  Vital Signs: BP 136/82   Pulse (!) 51   Temp 98.2 F (36.8 C) (Oral)   Resp 17   Ht '5\' 2"'  (1.575 m)   Wt 170 lb (77.1 kg)   SpO2 100%   BMI 31.09 kg/m   Physical Exam Constitutional:      Appearance: Normal appearance.  HENT:     Head: Normocephalic and atraumatic.  Cardiovascular:     Rate and Rhythm: Regular rhythm. Bradycardia present.  Pulmonary:  Effort: Pulmonary effort is normal. No respiratory distress.  Skin:    General: Skin is warm and dry.  Neurological:     Mental Status: She is alert and oriented to person, place, and time.    Imaging: DG Chest 2 View  Result Date: 05/24/2021 CLINICAL DATA:  Shortness of breath EXAM: CHEST - 2 VIEW COMPARISON:  10/10/2020 FINDINGS: Bibasilar airspace disease likely reflecting atelectasis. No focal consolidation. No pleural effusion or pneumothorax. Heart and mediastinal contours are unremarkable. No acute osseous abnormality. IMPRESSION: No active cardiopulmonary disease. Electronically Signed   By: Kathreen Devoid M.D.   On: 05/24/2021 14:46   CT HEAD WO CONTRAST (5MM)  Result Date: 05/26/2021 CLINICAL DATA:  Stroke suspected, left lateral eye field deficit, multiple myeloma on chemotherapy. EXAM: CT HEAD WITHOUT CONTRAST TECHNIQUE: Contiguous axial images were obtained from the base of the skull through the vertex without intravenous contrast. COMPARISON:  None. FINDINGS: Brain: No evidence of acute infarction, hemorrhage, cerebral edema, mass, mass effect, or midline shift. Ventricles and sulci are within normal limits for age. No extra-axial fluid collection. Vascular: No hyperdense vessel or  unexpected calcification. Skull: Normal. Negative for fracture or focal lesion. Sinuses/Orbits: Mucosal thickening in the ethmoid air cells. Other: The mastoid air cells are well aerated. IMPRESSION: No acute intracranial process. No etiology is seen for the patient's visual deficit. Electronically Signed   By: Merilyn Baba M.D.   On: 05/26/2021 18:17   CT Angio Chest PE W and/or Wo Contrast  Result Date: 05/24/2021 CLINICAL DATA:  Left lateral rib pain, shortness of breath EXAM: CT ANGIOGRAPHY CHEST CT ABDOMEN AND PELVIS WITH CONTRAST TECHNIQUE: Multidetector CT imaging of the chest was performed using the standard protocol during bolus administration of intravenous contrast. Multiplanar CT image reconstructions and MIPs were obtained to evaluate the vascular anatomy. Multidetector CT imaging of the abdomen and pelvis was performed using the standard protocol during bolus administration of intravenous contrast. CONTRAST:  77m OMNIPAQUE IOHEXOL 350 MG/ML SOLN COMPARISON:  None. FINDINGS: CTA CHEST FINDINGS Cardiovascular: Satisfactory opacification of the pulmonary arteries to the segmental level. No evidence of pulmonary embolism. Normal heart size. No pericardial effusion. Thoracic aortic atherosclerosis. Mediastinum/Nodes: No enlarged mediastinal, hilar, or axillary lymph nodes. Thyroid gland, trachea, and esophagus demonstrate no significant findings. Lungs/Pleura: Bilateral lower lobe airspace disease which may reflect atelectasis versus pneumonia, left greater than right. No pleural effusion or pneumothorax. Musculoskeletal: No chest wall abnormality. No acute or significant osseous findings. Review of the MIP images confirms the above findings. CT ABDOMEN and PELVIS FINDINGS Hepatobiliary: 5 mm hypodense lesion in the left hepatic lobe consistent with a small cyst. No other focal hepatic mass. No intrahepatic or extrahepatic biliary ductal dilatation. Normal gallbladder. Pancreas: Unremarkable. No  pancreatic ductal dilatation or surrounding inflammatory changes. Spleen: Normal in size without focal abnormality. Adrenals/Urinary Tract: Adrenal glands are unremarkable. Kidneys are normal, without renal calculi, focal lesion, or hydronephrosis. Bladder is unremarkable. Stomach/Bowel: Stomach is within normal limits. Appendix appears normal. No evidence of bowel wall thickening, distention, or inflammatory changes. Vascular/Lymphatic: Normal caliber abdominal aorta with moderate atherosclerosis. No lymphadenopathy. Reproductive: Uterus and bilateral adnexa are unremarkable. Small calcified uterine fibroid. Other: No abdominal wall hernia or abnormality. No abdominopelvic ascites. Musculoskeletal: No acute osseous abnormality. No aggressive osseous lesion. Grade 1 anterolisthesis of L4 on L5. Bilateral facet arthropathy throughout the thoracolumbar spine. Moderate-severe spinal stenosis at L4-5. Mild osteoarthritis of the hips bilaterally. Expansion of the left rectus abdominus muscle concerning for an intramuscular hematoma. Review  of the MIP images confirms the above findings. IMPRESSION: 1. Expansion of the left rectus abdominus muscle concerning for an intramuscular hematoma. 2. Bilateral lower lobe airspace disease which may reflect atelectasis versus pneumonia, left greater than right. 3. Lumbar spine spondylosis as described above. 4.  Aortic Atherosclerosis (ICD10-I70.0). Electronically Signed   By: Kathreen Devoid M.D.   On: 05/24/2021 15:46   CT ABDOMEN PELVIS W CONTRAST  Result Date: 05/24/2021 CLINICAL DATA:  Left lateral rib pain, shortness of breath EXAM: CT ANGIOGRAPHY CHEST CT ABDOMEN AND PELVIS WITH CONTRAST TECHNIQUE: Multidetector CT imaging of the chest was performed using the standard protocol during bolus administration of intravenous contrast. Multiplanar CT image reconstructions and MIPs were obtained to evaluate the vascular anatomy. Multidetector CT imaging of the abdomen and pelvis was  performed using the standard protocol during bolus administration of intravenous contrast. CONTRAST:  55m OMNIPAQUE IOHEXOL 350 MG/ML SOLN COMPARISON:  None. FINDINGS: CTA CHEST FINDINGS Cardiovascular: Satisfactory opacification of the pulmonary arteries to the segmental level. No evidence of pulmonary embolism. Normal heart size. No pericardial effusion. Thoracic aortic atherosclerosis. Mediastinum/Nodes: No enlarged mediastinal, hilar, or axillary lymph nodes. Thyroid gland, trachea, and esophagus demonstrate no significant findings. Lungs/Pleura: Bilateral lower lobe airspace disease which may reflect atelectasis versus pneumonia, left greater than right. No pleural effusion or pneumothorax. Musculoskeletal: No chest wall abnormality. No acute or significant osseous findings. Review of the MIP images confirms the above findings. CT ABDOMEN and PELVIS FINDINGS Hepatobiliary: 5 mm hypodense lesion in the left hepatic lobe consistent with a small cyst. No other focal hepatic mass. No intrahepatic or extrahepatic biliary ductal dilatation. Normal gallbladder. Pancreas: Unremarkable. No pancreatic ductal dilatation or surrounding inflammatory changes. Spleen: Normal in size without focal abnormality. Adrenals/Urinary Tract: Adrenal glands are unremarkable. Kidneys are normal, without renal calculi, focal lesion, or hydronephrosis. Bladder is unremarkable. Stomach/Bowel: Stomach is within normal limits. Appendix appears normal. No evidence of bowel wall thickening, distention, or inflammatory changes. Vascular/Lymphatic: Normal caliber abdominal aorta with moderate atherosclerosis. No lymphadenopathy. Reproductive: Uterus and bilateral adnexa are unremarkable. Small calcified uterine fibroid. Other: No abdominal wall hernia or abnormality. No abdominopelvic ascites. Musculoskeletal: No acute osseous abnormality. No aggressive osseous lesion. Grade 1 anterolisthesis of L4 on L5. Bilateral facet arthropathy throughout  the thoracolumbar spine. Moderate-severe spinal stenosis at L4-5. Mild osteoarthritis of the hips bilaterally. Expansion of the left rectus abdominus muscle concerning for an intramuscular hematoma. Review of the MIP images confirms the above findings. IMPRESSION: 1. Expansion of the left rectus abdominus muscle concerning for an intramuscular hematoma. 2. Bilateral lower lobe airspace disease which may reflect atelectasis versus pneumonia, left greater than right. 3. Lumbar spine spondylosis as described above. 4.  Aortic Atherosclerosis (ICD10-I70.0). Electronically Signed   By: HKathreen DevoidM.D.   On: 05/24/2021 15:46    Labs:  CBC: Recent Labs    05/25/21 0637 05/26/21 0649 05/27/21 0558 06/11/21 0942  WBC 3.3* 3.4* 6.3 4.9  HGB 10.4* 9.3* 9.6* 11.1*  HCT 32.0* 28.4* 30.1* 36.1  PLT 231 220 262 214    COAGS: Recent Labs    05/25/21 0637  INR 1.2    BMP: Recent Labs    05/26/21 0649 05/27/21 0558 05/29/21 1204 06/11/21 0942  NA 136 139 136 138  K 4.4 4.1 4.3 3.8  CL 105 106 102 103  CO2 '22 24 23 26  ' GLUCOSE 120* 105* 113* 72  BUN 25* 24* 29* 19  CALCIUM 7.5* 7.8* 8.7* 8.2*  CREATININE 0.79 0.84 0.93  1.10*  GFRNONAA >60 >60 >60 54*    LIVER FUNCTION TESTS: Recent Labs    05/25/21 0637 05/26/21 0649 05/27/21 0558 06/11/21 0942  BILITOT 0.8 0.5 0.6 0.5  AST '23 19 28 19  ' ALT '14 12 29 15  ' ALKPHOS 84 71 88 102  PROT 7.8 7.1 7.6 7.9  ALBUMIN 3.2* 3.1* 3.0* 4.1   Assessment and Plan: 70 year old female with recurrent multiple myeloma who follows with Oncology and was seen 11/17 without medical changes since this visit. The patient has been scheduled for an elective image guided port a catheter placement today for chemotherapy. She has had a previous right sided IJ port in 2019 and has previous tolerated moderate sedation.   The patient has been NPO, labs and vitals have been reviewed.  Risks and benefits of image guided port-a-catheter placement was discussed  with the patient including, but not limited to bleeding, infection, pneumothorax, or fibrin sheath development and need for additional procedures.  All of the patient's questions were answered, patient is agreeable to proceed. Consent signed and in chart.   Thank you for this interesting consult.  I greatly enjoyed meeting Brandi [name] and look forward to participating in their care.  A copy of this report was sent to the requesting provider on this date.  Electronically Signed: Hedy Jacob, PA-C 06/15/2021, 9:37 AM   I spent a total of 5 Minutes in face to face in clinical consultation, greater than 50% of which was counseling/coordinating care for

## 2021-06-16 ENCOUNTER — Inpatient Hospital Stay: Payer: Medicare Other | Admitting: Nurse Practitioner

## 2021-06-16 NOTE — Progress Notes (Signed)
Patient did not answer for telephone and unable to leave message due to full vm. Will have schedulers reach out to reschedule.

## 2021-06-25 ENCOUNTER — Other Ambulatory Visit: Payer: Self-pay | Admitting: Internal Medicine

## 2021-06-25 DIAGNOSIS — J209 Acute bronchitis, unspecified: Secondary | ICD-10-CM

## 2021-06-26 ENCOUNTER — Encounter: Payer: Self-pay | Admitting: Internal Medicine

## 2021-06-26 NOTE — Telephone Encounter (Signed)
See previous note from 12/24/20, signing note  

## 2021-06-29 ENCOUNTER — Encounter: Payer: Self-pay | Admitting: Nurse Practitioner

## 2021-06-29 ENCOUNTER — Inpatient Hospital Stay: Payer: Medicare Other | Attending: Nurse Practitioner | Admitting: Nurse Practitioner

## 2021-06-29 ENCOUNTER — Telehealth: Payer: Medicare Other | Admitting: Nurse Practitioner

## 2021-06-29 DIAGNOSIS — D849 Immunodeficiency, unspecified: Secondary | ICD-10-CM | POA: Diagnosis not present

## 2021-06-29 DIAGNOSIS — C9002 Multiple myeloma in relapse: Secondary | ICD-10-CM

## 2021-06-29 DIAGNOSIS — Z298 Encounter for other specified prophylactic measures: Secondary | ICD-10-CM | POA: Insufficient documentation

## 2021-06-29 DIAGNOSIS — Z79899 Other long term (current) drug therapy: Secondary | ICD-10-CM | POA: Insufficient documentation

## 2021-06-29 DIAGNOSIS — Z9225 Personal history of immunosupression therapy: Secondary | ICD-10-CM | POA: Insufficient documentation

## 2021-06-29 DIAGNOSIS — Z87891 Personal history of nicotine dependence: Secondary | ICD-10-CM | POA: Insufficient documentation

## 2021-06-29 DIAGNOSIS — I110 Hypertensive heart disease with heart failure: Secondary | ICD-10-CM | POA: Insufficient documentation

## 2021-06-29 DIAGNOSIS — C9000 Multiple myeloma not having achieved remission: Secondary | ICD-10-CM | POA: Insufficient documentation

## 2021-06-29 DIAGNOSIS — E0500 Thyrotoxicosis with diffuse goiter without thyrotoxic crisis or storm: Secondary | ICD-10-CM | POA: Insufficient documentation

## 2021-06-29 DIAGNOSIS — Z9484 Stem cells transplant status: Secondary | ICD-10-CM | POA: Insufficient documentation

## 2021-06-29 DIAGNOSIS — I5032 Chronic diastolic (congestive) heart failure: Secondary | ICD-10-CM | POA: Insufficient documentation

## 2021-06-29 NOTE — Progress Notes (Signed)
[facility]  Virtual Visit Progress Note for Evusheld   I connected with Brandi [name] on 06/29/21 at 10:20 AM EST by telephone visit and verified that I am speaking with the correct person using two identifiers.   I discussed the limitations, risks, security and privacy concerns of performing an evaluation and management service by telemedicine and the availability of in-person appointments. I also discussed with the patient that there may be a patient responsible charge related to this service. The patient expressed understanding and agreed to proceed.   Other persons participating in the visit and their role in the encounter: none   Patient's location: home  Provider's location: home  Chief Complaint: Evusheld     Patient Care Team: [facility], [place] as PCP - General (General Practice) End, Harrell Gave, MD as PCP - Cardiology (Cardiology) [name], [name] [name], MD as Consulting Physician (Hematology and Oncology) Ottie Glazier, MD as Consulting Physician (Pulmonary Disease) Gabriel Carina Betsey Holiday, MD as Consulting Physician (Endocrinology)   Name of the patient: Brandi [name]  [id number]  May 09, 1951   Date of visit: 06/29/21  History of Presenting Illness- Patient present for evaluation and consideration of Evusheld. Patient is considered moderately to severely immunocompromised based on active cancer treatment or active cancer, history of organ transplant, having received a stem cell transplant within the last 2 years and taking immunosuppression, or active treatment with immunosuppressing medications.   Review of systems- Review of Systems  Constitutional:  Positive for malaise/fatigue. Negative for chills, diaphoresis, fever and weight loss.  HENT:  Negative for congestion, ear discharge, ear pain, nosebleeds, sinus pain and sore throat.   Eyes:  Negative for pain and  redness.  Respiratory:  Negative for cough, hemoptysis, sputum production, shortness of breath, wheezing and stridor.   Cardiovascular:  Negative for chest pain, palpitations and leg swelling.  Gastrointestinal:  Negative for abdominal pain, constipation, diarrhea, nausea and vomiting.  Musculoskeletal:  Negative for falls, joint pain and myalgias.  Skin:  Negative for rash.  Neurological:  Negative for dizziness, loss of consciousness, weakness and headaches.  Psychiatric/Behavioral:  Negative for depression. The patient is not nervous/anxious and does not have insomnia.   All other systems reviewed and are negative.   No Known Allergies  Past Medical History:  Diagnosis Date   Anxiety    At risk for cardiac dysfunction    CHF (congestive heart failure) (HCC)    COPD (chronic obstructive pulmonary disease) (HCC)    Hypertension    Hyperthyroidism    Hypokalemia    IgA myeloma (HCC)    Multiple myeloma (HCC)    Pneumonia    Red blood cell antibody positive, compatible PRBC difficult to obtain    S/P autologous bone marrow transplantation [facility])     Past Surgical History:  Procedure Laterality Date   ESOPHAGOGASTRODUODENOSCOPY (EGD) WITH PROPOFOL N/A 03/30/2019   Procedure: ESOPHAGOGASTRODUODENOSCOPY (EGD) WITH PROPOFOL;  Surgeon: Jonathon Bellows, MD;  Location: [facility] ENDOSCOPY;  Service: Gastroenterology;  Laterality: N/A;   IR FLUORO GUIDE PORT INSERTION RIGHT  12/16/2017   IR IMAGING GUIDED PORT INSERTION  06/15/2021   THYROIDECTOMY N/A     Social History   Socioeconomic History   Marital status: Single    Spouse name: Not on file   Number of children: 3   Years of education: Not on file   Highest education level: Not on file  Occupational History   Not on file  Tobacco  Use   Smoking status: Former    Packs/day: 0.25    Types: Cigarettes    Quit date: 03/18/2021    Years since quitting: 0.2   Smokeless tobacco: Never   Tobacco comments:    2 cigarettes QOD  Vaping  Use   Vaping Use: Never used  Substance and Sexual Activity   Alcohol use: No   Drug use: No   Sexual activity: Not on file  Other Topics Concern   Not on file  Social History Narrative   Lives at home with family(son): daughter 8 blocks away. States "I'm never alone." Independent at baseline   Social Determinants of Radio broadcast assistant Strain: Not on file  Food Insecurity: Not on file  Transportation Needs: Not on file  Physical Activity: Not on file  Stress: Not on file  Social Connections: Not on file  Intimate Partner Violence: Not on file    Immunization History  Administered Date(s) Administered   Fluad Quad(high Dose 65+) 08/29/2020   Influenza, High Dose Seasonal PF 05/26/2017    Family History  Problem Relation Age of Onset   Pneumonia Mother    Seizures Father      Current Outpatient Medications:    acyclovir (ZOVIRAX) 400 MG tablet, Take 1 tablet (400 mg total) by mouth 2 (two) times daily., Disp: 60 tablet, Rfl: 3   albuterol (VENTOLIN HFA) 108 (90 Base) MCG/ACT inhaler, Inhale 2 puffs into the lungs every 6 (six) hours as needed for wheezing or shortness of breath., Disp: 8 g, Rfl: 3   aspirin EC 81 MG EC tablet, Take 1 tablet (81 mg total) by mouth daily., Disp: 90 tablet, Rfl: 3   benzonatate (TESSALON) 100 MG capsule, Take 1 capsule (100 mg total) by mouth 3 (three) times daily as needed for cough., Disp: 20 capsule, Rfl: 0   buPROPion (WELLBUTRIN) 75 MG tablet, Take 75 mg by mouth daily., Disp: , Rfl:    calcium-vitamin D (OSCAL WITH D) 500MG-200UNIT (5MCG) tablet, Take 1 tablet by mouth 2 (two) times daily., Disp: 60 tablet, Rfl: 0   chlorpheniramine-HYDROcodone (TUSSIONEX) 10-8 MG/5ML SUER, Take 5 mLs by mouth every 12 (twelve) hours as needed for cough., Disp: 140 mL, Rfl: 0   dexamethasone (DECADRON) 4 MG tablet, Start 2 days prior to infusion; Take for 2 days. Do not take on the day of infusion. (Patient taking differently: Take 4 mg by mouth 2  (two) times daily. Start 2 days prior to infusion; Take for 2 days. Do not take on the day of infusion.), Disp: 60 tablet, Rfl: 3   ergocalciferol (VITAMIN D2) 1.25 MG (50000 UT) capsule, Take 1 capsule (50,000 Units total) by mouth once a week., Disp: 12 capsule, Rfl: 2   feeding supplement, ENSURE ENLIVE, (ENSURE ENLIVE) LIQD, Take 237 mLs by mouth 2 (two) times daily between meals., Disp: 60 Bottle, Rfl: 0   furosemide (LASIX) 40 MG tablet, Take 1 tablet (40 mg total) by mouth daily., Disp: 90 tablet, Rfl: 3   guaiFENesin-codeine 100-10 MG/5ML syrup, TAKE 5 ML BY MOUTH  THREE TIMES DAILY AS NEEDED FOR COUGH. MAY CAUSE DROWSINESS. DO NOT DRIVE WITH THIS MEDICATION, Disp: 120 mL, Rfl: 0   levothyroxine (SYNTHROID) 112 MCG tablet, Take 112 mcg by mouth daily., Disp: , Rfl:    metoprolol succinate (TOPROL-XL) 25 MG 24 hr tablet, Take 1.5 tablets (37.5 mg total) by mouth daily. TAKE 1 & 1/2 (ONE & ONE-HALF) TABLETS BY MOUTH ONCE DAILY, Disp: 135 tablet,  Rfl: 0   montelukast (SINGULAIR) 10 MG tablet, TAKE 1 TABLET BY MOUTH AT BEDTIME, Disp: 30 tablet, Rfl: 0   pantoprazole (PROTONIX) 40 MG tablet, Take 1 tablet (40 mg total) by mouth daily., Disp: 90 tablet, Rfl: 1   polyethylene glycol (MIRALAX / GLYCOLAX) packet, Take 17 g by mouth daily as needed for mild constipation., Disp: 14 each, Rfl: 0   potassium chloride SA (KLOR-CON) 20 MEQ tablet, Take 1 tablet (20 mEq total) by mouth 2 (two) times daily., Disp: 60 tablet, Rfl: 6   spironolactone (ALDACTONE) 25 MG tablet, Take 1 tablet by mouth once daily, Disp: 90 tablet, Rfl: 0   Ipratropium-Albuterol (COMBIVENT) 20-100 MCG/ACT AERS respimat, Inhale into the lungs., Disp: , Rfl:    ondansetron (ZOFRAN) 8 MG tablet, One pill every 8 hours as needed for nausea/vomitting. (Patient not taking: Reported on 06/11/2021), Disp: 40 tablet, Rfl: 1 No current facility-administered medications for this visit.  Facility-Administered Medications Ordered in Other  Visits:    0.9 %  sodium chloride infusion, , Intravenous, Continuous, Monia Sabal, PA-C  Physical exam: Exam limited due to telemedicine Physical Exam Constitutional:      General: She is not in acute distress. Pulmonary:     Effort: No respiratory distress.  Neurological:     Mental Status: She is alert and oriented to person, place, and time.  Psychiatric:        Mood and Affect: Mood normal.        Behavior: Behavior normal.     Assessment and plan- Patient is a 71 y.o. female who presents to consideration of Evusheld.   We discussed that patient was referred to receive Evusheld (Tixagevimab and Cilgavimab). The FDA has issued an emergency use authorization (EUA) for this injection. Evusheld is a pre-exposure prevention of COVID-19 infection in adults who are at high risk for severe disease. Today we reviewed patient's risk and eligibility for Evusheld. We discussed that consent is voluntary and patient can refuse at any time. An FDA fact sheet will be given to patient prior to injection. We reviewed potential risks, benefits, and alternatives today. We reviewed that some risks and benefits are unknown. Patient verbalizes consent and wishes to proceed.   Advised not to receive a covid vaccine until 2 weeks after receiving Evusheld. Advised that patient should receive Evusheld every 6 months. Advised that receiving Evusheld is not a guarantee in the prevention of getting Covid-19 and that patient should still follow other infection prevention measures.    Visit Diagnosis 1. Immunocompromised ([place])   2. Multiple myeloma in relapse [facility])     Return to clinic  Scheduled for Evusheld asap. RTC in 6 months for virtual visit with me. Add Evusheld to her visit on 07/03/21.    I discussed the assessment and treatment plan with the patient. The patient was provided an opportunity to ask questions and all were answered. The patient agreed with the plan and demonstrated an understanding of  the instructions.   The patient was advised to call back or seek an in-person evaluation if the symptoms worsen or if the condition fails to improve as anticipated.   I spent 20 minutes spent on this telephone encounter.   Beckey Rutter, DNP, AGNP-C [place] [facility] [phone] (clinic)  CC: Dr. Rogue Bussing

## 2021-06-29 NOTE — Progress Notes (Signed)
Patient called/ pre- screened for telephone appointment today with oncologist. No new concerns.

## 2021-07-02 ENCOUNTER — Other Ambulatory Visit: Payer: Self-pay

## 2021-07-02 ENCOUNTER — Ambulatory Visit (INDEPENDENT_AMBULATORY_CARE_PROVIDER_SITE_OTHER): Payer: Medicare Other | Admitting: Nurse Practitioner

## 2021-07-02 ENCOUNTER — Encounter: Payer: Self-pay | Admitting: Nurse Practitioner

## 2021-07-02 VITALS — BP 132/80 | HR 51 | Ht 62.0 in | Wt 174.2 lb

## 2021-07-02 DIAGNOSIS — I5032 Chronic diastolic (congestive) heart failure: Secondary | ICD-10-CM

## 2021-07-02 DIAGNOSIS — I10 Essential (primary) hypertension: Secondary | ICD-10-CM | POA: Diagnosis not present

## 2021-07-02 NOTE — Patient Instructions (Signed)
Medication Instructions:  No changes at this time.   *If you need a refill on your cardiac medications before your next appointment, please call your pharmacy*   Lab Work: None  If you have labs (blood work) drawn today and your tests are completely normal, you will receive your results only by: [place] (if you have MyChart) OR A paper copy in the mail If you have any lab test that is abnormal or we need to change your treatment, we will call you to review the results.   Testing/Procedures: None   Follow-Up: At [facility], you and your health needs are our priority.  As part of our continuing mission to provide you with exceptional heart care, we have created designated Provider Care Teams.  These Care Teams include your primary Cardiologist (physician) and Advanced Practice Providers (APPs -  Physician Assistants and Nurse Practitioners) who all work together to provide you with the care you need, when you need it.  We recommend signing up for the patient portal called "MyChart".  Sign up information is provided on this After Visit Summary.  MyChart is used to connect with patients for Virtual Visits (Telemedicine).  Patients are able to view lab/test results, encounter notes, upcoming appointments, etc.  Non-urgent messages can be sent to your provider as well.   To learn more about what you can do with MyChart, go to [email or web address].    Your next appointment:   3 month(s)  The format for your next appointment:   In Person  Provider:   Cadence Kathlen Mody, PA-C

## 2021-07-02 NOTE — Progress Notes (Signed)
Office Visit    Patient Name: Brandi [name] Date of Encounter: 07/02/2021  Primary Care Provider:  [facility] [place] Primary Cardiologist:  Nelva Bush, MD  Chief Complaint    70 year old female with a history of multiple myeloma, HFpEF, hypertension, lower extremity edema, COPD, prior tobacco abuse, and Graves' disease status post thyroidectomy, who presents for follow-up of CHF.  Past Medical History    Past Medical History:  Diagnosis Date   (HFpEF) heart failure with preserved ejection fraction ([place])    a. 06/2020 Echo: EF 60-65%, no rwma, nl RV size/fxn. Triv MR. Triv TR/AI. Mod elev PASP.   Anxiety    COPD (chronic obstructive pulmonary disease) (HCC)    Hypertension    Hyperthyroidism    Hypokalemia    IgA myeloma (HCC)    Multiple myeloma (HCC)    Pneumonia    Red blood cell antibody positive, compatible PRBC difficult to obtain    S/P autologous bone marrow transplantation [facility])    Past Surgical History:  Procedure Laterality Date   ESOPHAGOGASTRODUODENOSCOPY (EGD) WITH PROPOFOL N/A 03/30/2019   Procedure: ESOPHAGOGASTRODUODENOSCOPY (EGD) WITH PROPOFOL;  Surgeon: Jonathon Bellows, MD;  Location: [facility] ENDOSCOPY;  Service: Gastroenterology;  Laterality: N/A;   IR FLUORO GUIDE PORT INSERTION RIGHT  12/16/2017   IR IMAGING GUIDED PORT INSERTION  06/15/2021   THYROIDECTOMY N/A     Allergies  No Known Allergies  History of Present Illness    70 year old female with the above past medical history including multiple myeloma diagnosed in 2018 status postchemotherapy and autologous stem cell transplant November 2019.  Other history includes HFpEF, hypertension, lower extremity edema, prior tobacco abuse, COPD, and hyperthyroidism/Graves' disease status post thyroidectomy.  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  In February 2021,  patient developed lower extremity edema and Revlimid therapy was held.  Transthoracic echo showed normal LV function with moderately elevated PASP and she has been maintained oral lasix.  In October of this year, Ms. Pinon was seen and reported a 31 pound weight gain.  At the time, she was taking Lasix 3-4 times per month and she was subsequently placed on Lasix 40 mg daily.  She subsequently required admission for pneumonia.  At her last follow-up on November 4, she was still recovering from pneumonia but noted 8 pound weight loss.  Labs were stable at that time and daily Lasix therapy was continued.  Since then, Ms. Mccready notes that her volume status has been stable.  Her weight has been trending about 172 pounds at home, which is up slightly.  She is 174 pounds on our scale today.  Despite slight shifts in weight, she has not been experiencing any dyspnea on exertion or lower extremity edema.  She previously noted abdominal fullness however, this has not returned.  She denies chest pain, palpitations, PND, orthopnea, dizziness, syncope, or early satiety.  She is taking physical therapy once a week and notes that she tolerates this well without symptoms or limitations.  Otherwise, she walks around her house some but has not really been outside since her pneumonia earlier in the fall.  Home Medications    Current Outpatient Medications  Medication Sig Dispense Refill   acyclovir (ZOVIRAX) 400 MG tablet Take 1 tablet (400 mg total) by mouth 2 (two) times daily. 60 tablet 3   aspirin EC 81 MG EC tablet Take 1 tablet (81 mg total)  by mouth daily. 90 tablet 3   buPROPion (WELLBUTRIN) 75 MG tablet Take 75 mg by mouth daily.     calcium-vitamin D (OSCAL WITH D) 500MG-200UNIT (5MCG) tablet Take 1 tablet by mouth 2 (two) times daily. 60 tablet 0   ergocalciferol (VITAMIN D2) 1.25 MG (50000 UT) capsule Take 1 capsule (50,000 Units total) by mouth once a week. 12 capsule 2   feeding supplement, ENSURE  ENLIVE, (ENSURE ENLIVE) LIQD Take 237 mLs by mouth 2 (two) times daily between meals. 60 Bottle 0   furosemide (LASIX) 40 MG tablet Take 1 tablet (40 mg total) by mouth daily. 90 tablet 3   levothyroxine (SYNTHROID) 112 MCG tablet Take 112 mcg by mouth daily.     metoprolol succinate (TOPROL-XL) 25 MG 24 hr tablet Take 1.5 tablets (37.5 mg total) by mouth daily. TAKE 1 & 1/2 (ONE & ONE-HALF) TABLETS BY MOUTH ONCE DAILY 135 tablet 0   montelukast (SINGULAIR) 10 MG tablet TAKE 1 TABLET BY MOUTH AT BEDTIME 30 tablet 0   pantoprazole (PROTONIX) 40 MG tablet Take 1 tablet (40 mg total) by mouth daily. 90 tablet 1   potassium chloride SA (KLOR-CON) 20 MEQ tablet Take 1 tablet (20 mEq total) by mouth 2 (two) times daily. 60 tablet 6   spironolactone (ALDACTONE) 25 MG tablet Take 1 tablet by mouth once daily 90 tablet 0   albuterol (VENTOLIN HFA) 108 (90 Base) MCG/ACT inhaler Inhale 2 puffs into the lungs every 6 (six) hours as needed for wheezing or shortness of breath. (Patient not taking: Reported on 07/02/2021) 8 g 3   benzonatate (TESSALON) 100 MG capsule Take 1 capsule (100 mg total) by mouth 3 (three) times daily as needed for cough. (Patient not taking: Reported on 07/02/2021) 20 capsule 0   chlorpheniramine-HYDROcodone (TUSSIONEX) 10-8 MG/5ML SUER Take 5 mLs by mouth every 12 (twelve) hours as needed for cough. (Patient not taking: Reported on 07/02/2021) 140 mL 0   dexamethasone (DECADRON) 4 MG tablet Start 2 days prior to infusion; Take for 2 days. Do not take on the day of infusion. (Patient not taking: Reported on 07/02/2021) 60 tablet 3   guaiFENesin-codeine 100-10 MG/5ML syrup TAKE 5 ML BY MOUTH  THREE TIMES DAILY AS NEEDED FOR COUGH. MAY CAUSE DROWSINESS. DO NOT DRIVE WITH THIS MEDICATION (Patient not taking: Reported on 07/02/2021) 120 mL 0   Ipratropium-Albuterol (COMBIVENT) 20-100 MCG/ACT AERS respimat Inhale into the lungs. (Patient not taking: Reported on 07/02/2021)     ondansetron (ZOFRAN) 8  MG tablet One pill every 8 hours as needed for nausea/vomitting. (Patient not taking: Reported on 06/11/2021) 40 tablet 1   polyethylene glycol (MIRALAX / GLYCOLAX) packet Take 17 g by mouth daily as needed for mild constipation. (Patient not taking: Reported on 07/02/2021) 14 each 0   No current facility-administered medications for this visit.   Facility-Administered Medications Ordered in Other Visits  Medication Dose Route Frequency Provider Last Rate Last Admin   0.9 %  sodium chloride infusion   Intravenous Continuous Monia Sabal, PA-C         Review of Systems    She denies chest pain, palpitations, dyspnea, pnd, orthopnea, n, v, dizziness, syncope, edema, weight gain, or early satiety.  All other systems reviewed and are otherwise negative except as noted above.  Physical Exam    VS:  BP 132/80 (BP Location: Left Arm, Patient Position: Sitting, Cuff Size: Normal)   Pulse (!) 51   Ht 5' 2" (1.575 m)  Wt 174 lb 3.2 oz (79 kg)   SpO2 98%   BMI 31.86 kg/m  , BMI Body mass index is 31.86 kg/m.     GEN: Well nourished, well developed, in no acute distress. HEENT: normal. Neck: Supple, no JVD, carotid bruits, or masses. Cardiac: RRR, no murmurs, rubs, or gallops. No clubbing, cyanosis, edema.  Radials/PT 2+ and equal bilaterally.  Respiratory:  Respirations regular and unlabored, clear to auscultation bilaterally. GI: Soft, nontender, nondistended, BS + x 4. MS: no deformity or atrophy. Skin: warm and dry, no rash. Neuro:  Strength and sensation are intact. Psych: Normal affect.  Accessory Clinical Findings    ECG personally reviewed by me today -sinus bradycardia, 51, nonspecific ST and T changes- no acute changes.  Lab Results  Component Value Date   WBC 4.9 06/11/2021   HGB 11.1 (L) 06/11/2021   HCT 36.1 06/11/2021   MCV 90.3 06/11/2021   PLT 214 06/11/2021   Lab Results  Component Value Date   CREATININE 1.10 (H) 06/11/2021   BUN 19 06/11/2021   NA 138  06/11/2021   K 3.8 06/11/2021   CL 103 06/11/2021   CO2 26 06/11/2021   Lab Results  Component Value Date   ALT 15 06/11/2021   AST 19 06/11/2021   GGT 143 (H) 03/26/2019   ALKPHOS 102 06/11/2021   BILITOT 0.5 06/11/2021   Assessment & Plan    1.  Chronic heart failure with preserved ejection fraction: EF 60 to 65% by echo in December 2021.  Volume has been stable since her last visit in November, the weight is up slightly.  She is mostly sedentary and says she has "a good appetite."  She is euvolemic on examination today and heart rate and blood pressure are stable.  She remains on Lasix 40 mg daily, spironolactone 25 mg daily, and Toprol-XL 37.5 mg daily.  2.  Essential hypertension: Blood pressure improved compared to last visit-132/80.  Continue current regimen as outlined above.  3.  Multiple myeloma/anemia: Followed closely by oncology with stable labs on November 17.  4.  COPD: No active wheezing and denies dyspnea.  She remains on inhaler therapy.  5.  Disposition: Follow-up in clinic in 3 months or sooner if necessary.   Murray Hodgkins, NP 07/02/2021, 11:08 AM

## 2021-07-03 ENCOUNTER — Inpatient Hospital Stay (HOSPITAL_BASED_OUTPATIENT_CLINIC_OR_DEPARTMENT_OTHER): Payer: Medicare Other | Admitting: Internal Medicine

## 2021-07-03 ENCOUNTER — Inpatient Hospital Stay: Payer: Medicare Other

## 2021-07-03 ENCOUNTER — Telehealth: Payer: Self-pay | Admitting: *Deleted

## 2021-07-03 ENCOUNTER — Other Ambulatory Visit: Payer: Self-pay | Admitting: *Deleted

## 2021-07-03 ENCOUNTER — Encounter: Payer: Self-pay | Admitting: Internal Medicine

## 2021-07-03 DIAGNOSIS — C9002 Multiple myeloma in relapse: Secondary | ICD-10-CM | POA: Diagnosis not present

## 2021-07-03 DIAGNOSIS — C9000 Multiple myeloma not having achieved remission: Secondary | ICD-10-CM | POA: Diagnosis present

## 2021-07-03 DIAGNOSIS — Z9484 Stem cells transplant status: Secondary | ICD-10-CM | POA: Diagnosis not present

## 2021-07-03 DIAGNOSIS — I5032 Chronic diastolic (congestive) heart failure: Secondary | ICD-10-CM | POA: Diagnosis not present

## 2021-07-03 DIAGNOSIS — Z79899 Other long term (current) drug therapy: Secondary | ICD-10-CM | POA: Diagnosis not present

## 2021-07-03 DIAGNOSIS — E0500 Thyrotoxicosis with diffuse goiter without thyrotoxic crisis or storm: Secondary | ICD-10-CM | POA: Diagnosis not present

## 2021-07-03 DIAGNOSIS — Z87891 Personal history of nicotine dependence: Secondary | ICD-10-CM | POA: Diagnosis not present

## 2021-07-03 DIAGNOSIS — I110 Hypertensive heart disease with heart failure: Secondary | ICD-10-CM | POA: Diagnosis not present

## 2021-07-03 DIAGNOSIS — E032 Hypothyroidism due to medicaments and other exogenous substances: Secondary | ICD-10-CM

## 2021-07-03 DIAGNOSIS — D849 Immunodeficiency, unspecified: Secondary | ICD-10-CM

## 2021-07-03 DIAGNOSIS — Z298 Encounter for other specified prophylactic measures: Secondary | ICD-10-CM | POA: Diagnosis not present

## 2021-07-03 DIAGNOSIS — Z9225 Personal history of immunosupression therapy: Secondary | ICD-10-CM | POA: Diagnosis not present

## 2021-07-03 LAB — CBC WITH DIFFERENTIAL/PLATELET
Abs Immature Granulocytes: 0.01 10*3/uL (ref 0.00–0.07)
Basophils Absolute: 0 10*3/uL (ref 0.0–0.1)
Basophils Relative: 0 %
Eosinophils Absolute: 0.4 10*3/uL (ref 0.0–0.5)
Eosinophils Relative: 6 %
HCT: 32.9 % — ABNORMAL LOW (ref 36.0–46.0)
Hemoglobin: 10.4 g/dL — ABNORMAL LOW (ref 12.0–15.0)
Immature Granulocytes: 0 %
Lymphocytes Relative: 63 %
Lymphs Abs: 4.4 10*3/uL — ABNORMAL HIGH (ref 0.7–4.0)
MCH: 28.7 pg (ref 26.0–34.0)
MCHC: 31.6 g/dL (ref 30.0–36.0)
MCV: 90.6 fL (ref 80.0–100.0)
Monocytes Absolute: 0.4 10*3/uL (ref 0.1–1.0)
Monocytes Relative: 5 %
Neutro Abs: 1.8 10*3/uL (ref 1.7–7.7)
Neutrophils Relative %: 26 %
Platelets: 205 10*3/uL (ref 150–400)
RBC: 3.63 MIL/uL — ABNORMAL LOW (ref 3.87–5.11)
RDW: 15.9 % — ABNORMAL HIGH (ref 11.5–15.5)
WBC: 7 10*3/uL (ref 4.0–10.5)
nRBC: 0 % (ref 0.0–0.2)

## 2021-07-03 LAB — COMPREHENSIVE METABOLIC PANEL
ALT: 13 U/L (ref 0–44)
AST: 21 U/L (ref 15–41)
Albumin: 3.7 g/dL (ref 3.5–5.0)
Alkaline Phosphatase: 105 U/L (ref 38–126)
Anion gap: 14 (ref 5–15)
BUN: 10 mg/dL (ref 8–23)
CO2: 22 mmol/L (ref 22–32)
Calcium: 7.5 mg/dL — ABNORMAL LOW (ref 8.9–10.3)
Chloride: 102 mmol/L (ref 98–111)
Creatinine, Ser: 1.02 mg/dL — ABNORMAL HIGH (ref 0.44–1.00)
GFR, Estimated: 60 mL/min — ABNORMAL LOW (ref >60)
Glucose, Bld: 81 mg/dL (ref 70–99)
Potassium: 3 mmol/L — ABNORMAL LOW (ref 3.5–5.1)
Sodium: 138 mmol/L (ref 135–145)
Total Bilirubin: 0.4 mg/dL (ref 0.3–1.2)
Total Protein: 7.8 g/dL (ref 6.5–8.1)

## 2021-07-03 MED ORDER — TIXAGEVIMAB (PART OF EVUSHELD) INJECTION
300.0000 mg | Freq: Once | INTRAMUSCULAR | Status: AC
  Administered 2021-07-03: 300 mg via INTRAMUSCULAR
  Filled 2021-07-03: qty 3

## 2021-07-03 MED ORDER — CILGAVIMAB (PART OF EVUSHELD) INJECTION
300.0000 mg | Freq: Once | INTRAMUSCULAR | Status: AC
  Administered 2021-07-03: 300 mg via INTRAMUSCULAR
  Filled 2021-07-03: qty 3

## 2021-07-03 MED ORDER — MONTELUKAST SODIUM 10 MG PO TABS: 10.0000 mg | ORAL_TABLET | Freq: Every day | ORAL | 0 refills | Status: DC

## 2021-07-03 MED ORDER — LENALIDOMIDE 15 MG PO CAPS: 15.0000 mg | ORAL_CAPSULE | Freq: Every day | ORAL | 1 refills | Status: DC

## 2021-07-03 MED ORDER — EPINEPHRINE 0.3 MG/0.3ML IJ SOAJ: 0.3000 mg | Freq: Once | INTRAMUSCULAR | Status: DC | PRN

## 2021-07-03 MED ORDER — LIDOCAINE-PRILOCAINE 2.5-2.5 % EX CREA: 1.0000 "application " | TOPICAL_CREAM | CUTANEOUS | 1 refills | Status: AC | PRN

## 2021-07-03 NOTE — Progress Notes (Signed)
Evushield administered. Pt observed for 30 minutes post administration per protocol. Vitals stable. No complications or abnormalities noted at administration site. RN educated pt on the importance of notifying the clinic if any complications occur at home and when to seek emergency care. Pt verbalized understanding and all questions answered at this time. Pt stable for discharge.   Tilia Faso [employer]

## 2021-07-03 NOTE — Progress Notes (Signed)
Patient has occasional muscle cramping.

## 2021-07-03 NOTE — Assessment & Plan Note (Addendum)
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 NOV 2022 M protein-0.5 g/dL.  Kappa/lambda light chain ratio 0.08.   # Most recently on salvage therapy with daratumumab; lenalidomide [15 mg 3 w; 1-w] dexamethasone. However therapy interrupted for recent hospitalization/pneumonia see below.   # RE-START REVLIMID 15 mg again. [last dose dec 7th]; res-tart dec 14th- 3 weeks/1 week off  #Continue to hold [employer] pneumoina]; and switch to IV Dara [abdominal hematoma]-plan to start in the next 2 months or so.   # Hyperthyroidism-Graves/goiter s/p total thyroidectomy [Dr.Kim-[facility]; [place]ia.Hodgkin ]- STABLE; pending thyroid profile.  # Chronic diastolic CHF-clinically stable at this time.  # Bone lesions /last zometa on 11/27/2017.  Holding Zometa secondary to poor dentition.  #  COVID EVUSHELD PROPHYLAXIS: Given the immunosuppressive effects of treatments I would recommend EVUSHELD prophylaxis.  IM injection x2 [same time]-cutdown risk of Covid infection/next 6 months; today. Flu host next visit.  Also on shingles prophylaxis.   # DISPOSITION:  # Evusheld today #  Follow up 4  Weeks-- MD; labs-cbc//cmp;:MM panle; K/l light chain ratio-Dr.B

## 2021-07-03 NOTE — Telephone Encounter (Signed)
[employer] called stating that the cycle information needs to be on the prescription sent to them ie days on, days off. Please resubmit prescription with this information

## 2021-07-03 NOTE — Telephone Encounter (Signed)
Resent the rx with instructions

## 2021-07-03 NOTE — Progress Notes (Signed)
[name] OFFICE PROGRESS NOTE  Patient Care Team: [facility], [place] as PCP - General (General Practice) End, Harrell Gave, MD as PCP - Cardiology (Cardiology) [name], [name] [name], MD as Consulting Physician (Hematology and Oncology) Ottie Glazier, MD as Consulting Physician (Pulmonary Disease) Gabriel Carina Betsey Holiday, MD as Consulting Physician (Endocrinology)   Cancer Staging  No matching staging information was found for the patient.   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 No morphologic support for residual myeloma disease. Negative for minimal residual disease by MM-MRD flow cytometry; HOLD REVLIMID [leg swelling]; AUG 2021-PET scan negative for myeloma; continue to hold Revlimid  # MARCH 2021-diastolic congestive heart failure [Dr.End]  # BMBx- OCT 2021- BMBx- 5% plasmacytosis-however this appears to be more polyclonal rather than monoclonal-not explain patient worsening anemia.   # OCT 2022- 18th- Dara- Rev-Dex  #November  2021-hyperthyroidism/goiter- [D.Bennett/Dr.Solum]-methimazole. S/p Thyroidectomy [Dr.Kim; [facility]992] --------------------------  # 12/12- RIGHT JUGULAR DVT-x 338mn xarelto; finished April 2020; September 2020-EGD/dysphagia; Dr. AnVicente Males# Acute renal failure [Dr.Singh; Proteinuria 1.5gm/day ]; acyclovir/Asprin ------------------------------------------------------------------------------------------------------------   DIAGNOSIS: '[ ]'  MULTIPLE MYELOMA  STAGE: III/HIGH RISK ;GOALS: CONTROL  CURRENT/MOST RECENT THERAPY-maintenance Revlimid- on HOLD    Multiple myeloma ([facility] 11/21/2017 - 04/28/2018 Chemotherapy   Patient is on Treatment Plan : MYELOMA SALVAGE Cyclophosphamide / Carfilzomib / Dexamethasone (CCd) q28d     05/12/2021 -  Chemotherapy   Patient is on Treatment Plan : MYELOMA RELAPSED REFRACTORY Daratumumab SQ + Lenalidomide + Dexamethasone (DaraRd) q28d      INTERVAL HISTORY: Ambulating independently.  Alone.. [name].  female pleasant patient above history of relapsed multiple myeloma--currently on daratumumab-Revlimid-dexamethasone [s/p 2 infusions of Dara] is here for follow-up.  In the interim patient also had a Mediport placed.  Patient's current therapy is interrupted because of recent hospitalization abdominal wall hematoma.   Patient however finished her cycle #1 of Revlimid-2 days ago.  No nausea no vomiting no diarrhea.  patient is currently home.  Denies any worsening shortness of breath or cough.  No fever no chills. She feels back to baseline.  Review of Systems  Constitutional:  Positive for malaise/fatigue. Negative for chills, diaphoresis and fever.  HENT:  Negative for nosebleeds and sore throat.   Eyes:  Negative for double vision.  Respiratory:  Negative for hemoptysis.   Cardiovascular:  Negative for chest pain, palpitations and orthopnea.  Gastrointestinal:  Negative for abdominal pain, blood in stool, constipation, heartburn  and melena.  Genitourinary:  Negative for dysuria, frequency and urgency.  Musculoskeletal:  Positive for back pain.  Skin: Negative.  Negative for itching and rash.  Neurological:  Negative for dizziness, tingling, focal weakness, weakness and headaches.  Endo/Heme/Allergies:  Does not bruise/bleed easily.  Psychiatric/Behavioral:  Negative for depression. The patient is not nervous/anxious.    PAST MEDICAL HISTORY :  Past Medical History:  Diagnosis Date   (HFpEF) heart failure with preserved ejection fraction ([place])    a. 06/2020 Echo: EF 60-65%, no rwma, nl RV size/fxn. Triv MR. Triv TR/AI. Mod elev PASP.   Anxiety    COPD (chronic obstructive pulmonary disease) (HCC)    Hypertension    Hyperthyroidism    Hypokalemia    IgA myeloma (HCC)    Multiple myeloma (HCC)    Pneumonia    Red blood cell antibody positive, compatible PRBC difficult to obtain    S/P autologous bone marrow transplantation ([place])     PAST SURGICAL HISTORY :   Past Surgical History:  Procedure Laterality Date   ESOPHAGOGASTRODUODENOSCOPY (EGD) WITH PROPOFOL N/A 03/30/2019   Procedure: ESOPHAGOGASTRODUODENOSCOPY (EGD) WITH PROPOFOL;  Surgeon: Jonathon Bellows, MD;  Location: [facility] ENDOSCOPY;  Service: Gastroenterology;  Laterality: N/A;   IR FLUORO GUIDE PORT INSERTION RIGHT  12/16/2017   IR IMAGING GUIDED PORT INSERTION  06/15/2021   THYROIDECTOMY N/A     FAMILY HISTORY :   Family History  Problem Relation Age of Onset   Pneumonia Mother    Seizures Father     SOCIAL HISTORY:   Social History   Tobacco Use   Smoking status: Former    Packs/day: 0.25    Types: Cigarettes    Quit date: 03/18/2021    Years since quitting: 0.2   Smokeless tobacco: Never   Tobacco comments:    2 cigarettes QOD  Vaping Use   Vaping Use: Never used  Substance Use Topics   Alcohol use: No   Drug use: No    ALLERGIES:  has No Known Allergies.  MEDICATIONS:  Current Outpatient Medications  Medication Sig Dispense  Refill   acyclovir (ZOVIRAX) 400 MG tablet Take 1 tablet (400 mg total) by mouth 2 (two) times daily. 60 tablet 3   aspirin EC 81 MG EC tablet Take 1 tablet (81 mg total) by mouth daily. 90 tablet 3   buPROPion (WELLBUTRIN) 75 MG tablet Take 75 mg by mouth daily.     calcium-vitamin D (OSCAL WITH D) 500MG-200UNIT (5MCG) tablet Take 1 tablet by mouth 2 (two) times daily. 60 tablet 0   ergocalciferol (VITAMIN D2) 1.25 MG (50000 UT) capsule Take 1 capsule (50,000 Units total) by mouth once a week. 12 capsule 2   feeding supplement, ENSURE ENLIVE, (ENSURE ENLIVE) LIQD Take 237 mLs by mouth 2 (two) times daily between meals. 60 Bottle 0   furosemide (LASIX) 40 MG tablet Take 1 tablet (40 mg total) by mouth daily. 90 tablet 3   levothyroxine (SYNTHROID) 112 MCG tablet Take 112 mcg by mouth daily.     lidocaine-prilocaine (EMLA) cream Apply 1 application topically as needed. Apply to port and cover with saran wrap 1-2 hours prior to port access 30 g 1   metoprolol succinate (TOPROL-XL) 25 MG 24 hr tablet Take 1.5 tablets (37.5 mg total) by mouth daily. TAKE 1 & 1/2 (ONE & ONE-HALF) TABLETS BY MOUTH ONCE DAILY 135 tablet 0   pantoprazole (PROTONIX) 40 MG tablet Take 1 tablet (40 mg total) by mouth daily. 90 tablet 1   potassium chloride SA (KLOR-CON) 20 MEQ tablet Take 1 tablet (20 mEq total) by mouth 2 (two) times daily. 60 tablet 6   spironolactone (ALDACTONE) 25 MG tablet Take 1 tablet by mouth once daily 90 tablet 0  albuterol (VENTOLIN HFA) 108 (90 Base) MCG/ACT inhaler Inhale 2 puffs into the lungs every 6 (six) hours as needed for wheezing or shortness of breath. (Patient not taking: Reported on 07/02/2021) 8 g 3   benzonatate (TESSALON) 100 MG capsule Take 1 capsule (100 mg total) by mouth 3 (three) times daily as needed for cough. (Patient not taking: Reported on 07/02/2021) 20 capsule 0   chlorpheniramine-HYDROcodone (TUSSIONEX) 10-8 MG/5ML SUER Take 5 mLs by mouth every 12 (twelve) hours as needed  for cough. (Patient not taking: Reported on 07/02/2021) 140 mL 0   dexamethasone (DECADRON) 4 MG tablet Start 2 days prior to infusion; Take for 2 days. Do not take on the day of infusion. (Patient not taking: Reported on 07/02/2021) 60 tablet 3   guaiFENesin-codeine 100-10 MG/5ML syrup TAKE 5 ML BY MOUTH  THREE TIMES DAILY AS NEEDED FOR COUGH. MAY CAUSE DROWSINESS. DO NOT DRIVE WITH THIS MEDICATION (Patient not taking: Reported on 07/02/2021) 120 mL 0   Ipratropium-Albuterol (COMBIVENT) 20-100 MCG/ACT AERS respimat Inhale into the lungs. (Patient not taking: Reported on 07/02/2021)     montelukast (SINGULAIR) 10 MG tablet Take 1 tablet (10 mg total) by mouth at bedtime. 30 tablet 0   ondansetron (ZOFRAN) 8 MG tablet One pill every 8 hours as needed for nausea/vomitting. (Patient not taking: Reported on 06/11/2021) 40 tablet 1   polyethylene glycol (MIRALAX / GLYCOLAX) packet Take 17 g by mouth daily as needed for mild constipation. (Patient not taking: Reported on 07/02/2021) 14 each 0   No current facility-administered medications for this visit.   Facility-Administered Medications Ordered in Other Visits  Medication Dose Route Frequency Provider Last Rate Last Admin   0.9 %  sodium chloride infusion   Intravenous Continuous Monia Sabal, PA-C        PHYSICAL EXAMINATION: ECOG PERFORMANCE STATUS: 1 - Symptomatic but completely ambulatory  BP 132/81   Pulse (!) 53   Temp 98.6 F (37 C)   Resp 16   Wt 174 lb (78.9 kg)   BMI 31.83 kg/m   Filed Weights   07/03/21 1050  Weight: 174 lb (78.9 kg)    Physical Exam HENT:     Head: Normocephalic and atraumatic.  Eyes:     Pupils: Pupils are equal, round, and reactive to light.  Cardiovascular:     Rate and Rhythm: Normal rate and regular rhythm.  Pulmonary:     Effort: Pulmonary effort is normal. No respiratory distress.     Breath sounds: Normal breath sounds. No wheezing.  Abdominal:     General: Bowel sounds are normal. There is no  distension.     Palpations: Abdomen is soft. There is no mass.     Tenderness: There is no abdominal tenderness. There is no guarding or rebound.  Musculoskeletal:        General: No tenderness. Normal range of motion.     Cervical back: Normal range of motion and neck supple.  Skin:    General: Skin is warm.  Neurological:     Mental Status: She is alert and oriented to person, place, and time.  Psychiatric:        Mood and Affect: Affect normal.   LABORATORY DATA:  I have reviewed the data as listed    Component Value Date/Time   NA 138 07/03/2021 1032   NA 141 01/25/2020 1530   K 3.0 (L) 07/03/2021 1032   CL 102 07/03/2021 1032   CO2 22 07/03/2021 1032   GLUCOSE  81 07/03/2021 1032   BUN 10 07/03/2021 1032   BUN 19 01/25/2020 1530   CREATININE 1.02 (H) 07/03/2021 1032   CALCIUM 7.5 (L) 07/03/2021 1032   PROT 7.8 07/03/2021 1032   ALBUMIN 3.7 07/03/2021 1032   AST 21 07/03/2021 1032   ALT 13 07/03/2021 1032   ALKPHOS 105 07/03/2021 1032   BILITOT 0.4 07/03/2021 1032   GFRNONAA 60 (L) 07/03/2021 1032   GFRAA NOT CALCULATED 04/17/2020 1352    No results found for: SPEP, UPEP  Lab Results  Component Value Date   WBC 7.0 07/03/2021   NEUTROABS 1.8 07/03/2021   HGB 10.4 (L) 07/03/2021   HCT 32.9 (L) 07/03/2021   MCV 90.6 07/03/2021   PLT 205 07/03/2021      Chemistry      Component Value Date/Time   NA 138 07/03/2021 1032   NA 141 01/25/2020 1530   K 3.0 (L) 07/03/2021 1032   CL 102 07/03/2021 1032   CO2 22 07/03/2021 1032   BUN 10 07/03/2021 1032   BUN 19 01/25/2020 1530   CREATININE 1.02 (H) 07/03/2021 1032      Component Value Date/Time   CALCIUM 7.5 (L) 07/03/2021 1032   ALKPHOS 105 07/03/2021 1032   AST 21 07/03/2021 1032   ALT 13 07/03/2021 1032   BILITOT 0.4 07/03/2021 1032     Results for Brandi [name], Brandi [name] (MRN [id number]) as of 04/28/2021 16:46  Ref. Range 10/24/2020 13:16 12/24/2020 14:01 01/14/2021 14:13 04/01/2021 14:34 04/28/2021 09:17  Kappa  free light chain Latest Ref Range: 3.3 - 19.4 mg/L 23.1 (H) 16.8  12.4   Lambda free light chains Latest Ref Range: 5.7 - 26.3 mg/L 36.6 (H) 41.1 (H)  156.5 (H)   Kappa, lambda light chain ratio Latest Ref Range: 0.26 - 1.65  0.63 0.41  0.08 (L)      RADIOGRAPHIC STUDIES: I have personally reviewed the radiological images as listed and agreed with the findings in the report. No results found.   ASSESSMENT & PLAN:  Multiple myeloma ([place]) # RECURRENT Multiple myeloma stage III [high-risk cytogenetics-status post KRD-VGPR ; status post autologous stem cell transplant on 06/15/18. NOV 2022 M protein-0.5 g/dL.  Kappa/lambda light chain ratio 0.08.   # Most recently on salvage therapy with daratumumab; lenalidomide [15 mg 3 w; 1-w] dexamethasone. However therapy interrupted for recent hospitalization/pneumonia see below.   # RE-START REVLIMID 15 mg again. [last dose dec 7th]; res-tart dec 14th- 3 weeks/1 week off  #Continue to hold [employer] pneumoina]; and switch to IV Dara [abdominal hematoma]-plan to start in the next 2 months or so.   # Hyperthyroidism-[place]/goiter s/p total thyroidectomy [Dr.Kim-[facility]; [place]ylia.Hodgkin ]- STABLE; pending thyroid profile.  # Chronic diastolic CHF-clinically stable at this time.  # Bone lesions /last zometa on 11/27/2017.  Holding Zometa secondary to poor dentition.  #  COVID EVUSHELD PROPHYLAXIS: Given the immunosuppressive effects of treatments I would recommend EVUSHELD prophylaxis.  IM injection x2 [same time]-cutdown risk of Covid infection/next 6 months; today. Flu host next visit.  Also on shingles prophylaxis.   # DISPOSITION:  # Evusheld today #  Follow up 4  Weeks-- MD; labs-cbc//cmp;:MM panle; K/l light chain ratio-Dr.B      No orders of the defined types were placed in this encounter.   All questions were answered. The patient knows to call the clinic with any problems, questions or concerns.      [name], MD 07/03/2021 11:38  AM

## 2021-07-04 LAB — THYROID PANEL WITH TSH
Free Thyroxine Index: 2.1 (ref 1.2–4.9)
T3 Uptake Ratio: 25 % (ref 24–39)
T4, Total: 8.4 ug/dL (ref 4.5–12.0)
TSH: 10.7 u[IU]/mL — ABNORMAL HIGH (ref 0.450–4.500)

## 2021-07-06 ENCOUNTER — Telehealth: Payer: Self-pay | Admitting: *Deleted

## 2021-07-06 ENCOUNTER — Encounter: Payer: Self-pay | Admitting: Internal Medicine

## 2021-07-06 LAB — KAPPA/LAMBDA LIGHT CHAINS
Kappa free light chain: 9.9 mg/L (ref 3.3–19.4)
Kappa, lambda light chain ratio: 0.06 — ABNORMAL LOW (ref 0.26–1.65)
Lambda free light chains: 177.6 mg/L — ABNORMAL HIGH (ref 5.7–26.3)

## 2021-07-06 MED ORDER — LEVOTHYROXINE SODIUM 125 MCG PO TABS: 125.0000 ug | ORAL_TABLET | Freq: Every day | ORAL | 4 refills | Status: DC

## 2021-07-06 NOTE — Telephone Encounter (Signed)
-----   Message from Verlon Au, NP sent at 07/06/2021  3:12 PM EST ----- Regarding: RE: tsh results Please find out if patient has been compliant with medication. If so, send prescription for 125 mcg daily and I'll cosign. If not, encourage compliance and taking medication at the same time every time.    ----- Message ----- From: Gloris Ham, RN Sent: 07/06/2021   9:27 AM EST To: Vanice Sarah, CMA, Gloris Ham, RN, # Subject: tsh results                                    Lauren- will you review the tsh results. Looks like patient is on synthroid 112 mcg daily. Does pt need any changes?   ----- Message ----- From: Buel Ream, Lab In [place] Sent: 07/03/2021  10:53 AM EST To: [name], MD

## 2021-07-06 NOTE — Telephone Encounter (Signed)
I spoke with the patient and her daughter. Pt is consistently taking her synthroid 112 mcg daily. She has not missed any doses of her synthroid. I explained to her that the provider needed to make an adjustment to increase her synthroid  dose due to the current lab values.  Pt gave verbal understanding. New script sent to pt's [facility]- [address] for synthroid 125 mcg per v/o Beckey Rutter, NP.

## 2021-07-07 ENCOUNTER — Telehealth: Payer: Self-pay | Admitting: *Deleted

## 2021-07-07 NOTE — Telephone Encounter (Signed)
[employer] called reporting that the patient [employer] survey is flagged and that we need to call [employer] to clear the flag then call [employer] back to let them know the flag has been cleared

## 2021-07-07 NOTE — Telephone Encounter (Signed)
Called [employer] to review why survey was flagged.  The question of "have you reminded patient NOT donate blood while taking Revlimid" was marked no.  Clarification with [employer] that the question should be marked as "yes".  They have correncted the question answer and removed flag.  Contacted [employer] to let them know the flag has been removed.

## 2021-07-07 NOTE — Telephone Encounter (Signed)
Derrill Memo Smith/Andrea will need to handle. Thanks.Nira Conn, Lemmie Evens, Rn

## 2021-07-08 LAB — MULTIPLE MYELOMA PANEL, SERUM
Albumin SerPl Elph-Mcnc: 3.4 g/dL (ref 2.9–4.4)
Albumin/Glob SerPl: 0.9 (ref 0.7–1.7)
Alpha 1: 0.2 g/dL (ref 0.0–0.4)
Alpha2 Glob SerPl Elph-Mcnc: 0.6 g/dL (ref 0.4–1.0)
B-Globulin SerPl Elph-Mcnc: 1.5 g/dL — ABNORMAL HIGH (ref 0.7–1.3)
Gamma Glob SerPl Elph-Mcnc: 1.5 g/dL (ref 0.4–1.8)
Globulin, Total: 3.8 g/dL (ref 2.2–3.9)
IgA: 1066 mg/dL — ABNORMAL HIGH (ref 87–352)
IgG (Immunoglobin G), Serum: 568 mg/dL — ABNORMAL LOW (ref 586–1602)
IgM (Immunoglobulin M), Srm: 15 mg/dL — ABNORMAL LOW (ref 26–217)
M Protein SerPl Elph-Mcnc: 0.7 g/dL — ABNORMAL HIGH
Total Protein ELP: 7.2 g/dL (ref 6.0–8.5)

## 2021-07-10 ENCOUNTER — Telehealth: Payer: Self-pay | Admitting: *Deleted

## 2021-07-10 NOTE — Telephone Encounter (Signed)
Patient called reporting that she is having severe cramps "all over my body" she states that this has been going on since having her Thyroid removed, but it is worse today. She thinks that her calcium level is low and she is asking for pain medicine. Please advise  1 Follow-up Encounter Component Ref Range & Units 7 d ago 1 mo ago 5 mo ago 1 yr ago 4 yr ago  TSH 0.450 - 4.500 uIU/mL 10.700 High   2.285 R, CM  <0.005 Low   <0.005 Low   0.071 Low  R, CM   T4, Total 4.5 - 12.0 ug/dL 8.4   3.8 Low   14.7 High     T3 Uptake Ratio 24 - 39 % 25   19 Low   41 High     Free Thyroxine Index 1.2 - 4.9 2.1   0.7 Low  CM  6.0 High  CM    Comment: (NOTE)  Performed At: [facility]  [address] [place], [place] [id number]  Rush Farmer MD XL:[phone]   Resulting Agency  [facility] [place] [facility] [place] [facility] [place] [facility] [place] [facility]         Specimen Collected: 07/03/21 10:32 Last Resulted: 07/04/21 08:37      1 Follow-up Encounter Component Ref Range & Units 7 d ago  (07/03/21) 4 wk ago  (06/11/21) 1 mo ago  (05/29/21) 1 mo ago  (05/27/21) 1 mo ago  (05/26/21) 1 mo ago  (05/25/21) 1 mo ago  (05/24/21)  Sodium 135 - 145 mmol/L 138  138  136  139  136  133 Low   130 Low    Potassium 3.5 - 5.1 mmol/L 3.0 Low   3.8  4.3  4.1  4.4  3.8  3.5   Chloride 98 - 111 mmol/L 102  103  102  106  105  100  96 Low    CO2 22 - 32 mmol/L 22  26  23  24  22  23  22    Glucose, Bld 70 - 99 mg/dL 81  72 CM  113 High  CM  105 High  CM  120 High  CM  118 High  CM  112 High  CM   Comment: Glucose reference range applies only to samples taken after fasting for at least 8 hours.  BUN 8 - 23 mg/dL 10  19  29  High   24 High   25 High   19  16   Creatinine, Ser 0.44 - 1.00 mg/dL 1.02 High   1.10 High   0.93  0.84  0.79  0.96  0.99   Calcium 8.9 - 10.3 mg/dL 7.5 Low   8.2 Low   8.7 Low   7.8 Low   7.5 Low   7.1 Low   7.3 Low    Total Protein 6.5 - 8.1 g/dL 7.8  7.9   7.6  7.1  7.8  8.6 High    Albumin 3.5 - 5.0  g/dL 3.7  4.1   3.0 Low   3.1 Low   3.2 Low   3.8   AST 15 - 41 U/L 21  19   28  19  23  24    ALT 0 - 44 U/L 13  15   29  12  14  15    Alkaline Phosphatase 38 - 126 U/L 105  102   88  71  84  98   Total  Bilirubin 0.3 - 1.2 mg/dL 0.4  0.5   0.6  0.5  0.8  1.4 High    GFR, Estimated >60 mL/min 60 Low   54 Low  CM  >60 CM  >60 CM  >60 CM  >60 CM  >60 CM   Comment: (NOTE)  Calculated using the CKD-EPI Creatinine Equation (2021)   Anion gap 5 - 15 14  9  CM  11 CM  9 CM  9 CM  10 CM  12 CM   Comment: Performed at [facility], [address] [place], [place] [address]  Resulting Agency  [facility] [place] [facility] [place] [facility] [place] [facility] [place] [facility] [place] [facility] [place] [facility]         Specimen Collected: 07/03/21 10:32 Last Resulted: 07/03/21 11:10

## 2021-07-10 NOTE — Telephone Encounter (Signed)
Per Sharion Dove, NP, patient needs to go to Er for lab check an calcium. Patient informed of this and agrees to go to ER

## 2021-07-26 ENCOUNTER — Encounter: Payer: Self-pay | Admitting: Internal Medicine

## 2021-07-31 ENCOUNTER — Inpatient Hospital Stay: Payer: Medicare Other | Admitting: Internal Medicine

## 2021-07-31 ENCOUNTER — Inpatient Hospital Stay: Payer: Medicare Other | Attending: Internal Medicine

## 2021-07-31 DIAGNOSIS — Z23 Encounter for immunization: Secondary | ICD-10-CM | POA: Insufficient documentation

## 2021-07-31 DIAGNOSIS — C9002 Multiple myeloma in relapse: Secondary | ICD-10-CM | POA: Insufficient documentation

## 2021-07-31 DIAGNOSIS — R252 Cramp and spasm: Secondary | ICD-10-CM | POA: Insufficient documentation

## 2021-08-05 ENCOUNTER — Other Ambulatory Visit: Payer: Self-pay | Admitting: *Deleted

## 2021-08-05 ENCOUNTER — Other Ambulatory Visit: Payer: Self-pay | Admitting: Internal Medicine

## 2021-08-05 DIAGNOSIS — C9002 Multiple myeloma in relapse: Secondary | ICD-10-CM

## 2021-08-06 ENCOUNTER — Telehealth: Payer: Self-pay | Admitting: Internal Medicine

## 2021-08-06 ENCOUNTER — Encounter: Payer: Self-pay | Admitting: Internal Medicine

## 2021-08-06 NOTE — Telephone Encounter (Signed)
[name] called pt and got her a new appt

## 2021-08-06 NOTE — Telephone Encounter (Signed)
Pt called to reschedule appt that she missed on 1-6. Call back at [phone]

## 2021-08-10 NOTE — Telephone Encounter (Signed)
Patient NS 07/31/21 MD appt.  MD would like to wait for patient to be seen at her next scheduled appt on 08/20/21 before sending Revlimid refill.

## 2021-08-11 ENCOUNTER — Encounter: Payer: Self-pay | Admitting: Internal Medicine

## 2021-08-12 ENCOUNTER — Telehealth: Payer: Self-pay | Admitting: *Deleted

## 2021-08-12 NOTE — Telephone Encounter (Addendum)
Call from patient and daughter reporting that patient has a rash and bright redness 1 inch diameter at her port site that was inserted a couple of months ago. They think it is infected. There is no swelling of site nor is it hot to touch. Please advise

## 2021-08-12 NOTE — Telephone Encounter (Signed)
Called pt and offered [facility] appointment. Pt/daughter in agreement. Pt scheduled for tomorrow AM.

## 2021-08-13 ENCOUNTER — Inpatient Hospital Stay: Payer: Medicare Other

## 2021-08-13 ENCOUNTER — Other Ambulatory Visit: Payer: Self-pay

## 2021-08-13 ENCOUNTER — Other Ambulatory Visit: Payer: Self-pay | Admitting: Hospice and Palliative Medicine

## 2021-08-13 ENCOUNTER — Encounter: Payer: Self-pay | Admitting: Internal Medicine

## 2021-08-13 ENCOUNTER — Ambulatory Visit
Admission: RE | Admit: 2021-08-13 | Discharge: 2021-08-13 | Disposition: A | Discharge status: Home / Self Care | Payer: Medicare Other | Source: Ambulatory Visit | Attending: Hospice and Palliative Medicine | Admitting: Hospice and Palliative Medicine

## 2021-08-13 ENCOUNTER — Inpatient Hospital Stay (HOSPITAL_BASED_OUTPATIENT_CLINIC_OR_DEPARTMENT_OTHER): Payer: Medicare Other | Admitting: Hospice and Palliative Medicine

## 2021-08-13 VITALS — BP 122/66 | HR 46 | Temp 99.7°F | Resp 16

## 2021-08-13 DIAGNOSIS — R609 Edema, unspecified: Secondary | ICD-10-CM | POA: Diagnosis not present

## 2021-08-13 DIAGNOSIS — L539 Erythematous condition, unspecified: Secondary | ICD-10-CM

## 2021-08-13 DIAGNOSIS — Z23 Encounter for immunization: Secondary | ICD-10-CM | POA: Diagnosis not present

## 2021-08-13 DIAGNOSIS — C9002 Multiple myeloma in relapse: Secondary | ICD-10-CM

## 2021-08-13 DIAGNOSIS — R21 Rash and other nonspecific skin eruption: Secondary | ICD-10-CM | POA: Diagnosis not present

## 2021-08-13 DIAGNOSIS — R252 Cramp and spasm: Secondary | ICD-10-CM

## 2021-08-13 LAB — CBC WITH DIFFERENTIAL/PLATELET
Abs Immature Granulocytes: 0.02 10*3/uL (ref 0.00–0.07)
Basophils Absolute: 0 10*3/uL (ref 0.0–0.1)
Basophils Relative: 0 %
Eosinophils Absolute: 0.4 10*3/uL (ref 0.0–0.5)
Eosinophils Relative: 7 %
HCT: 33.1 % — ABNORMAL LOW (ref 36.0–46.0)
Hemoglobin: 10.7 g/dL — ABNORMAL LOW (ref 12.0–15.0)
Immature Granulocytes: 0 %
Lymphocytes Relative: 63 %
Lymphs Abs: 3.7 10*3/uL (ref 0.7–4.0)
MCH: 29.4 pg (ref 26.0–34.0)
MCHC: 32.3 g/dL (ref 30.0–36.0)
MCV: 90.9 fL (ref 80.0–100.0)
Monocytes Absolute: 0.3 10*3/uL (ref 0.1–1.0)
Monocytes Relative: 5 %
Neutro Abs: 1.5 10*3/uL — ABNORMAL LOW (ref 1.7–7.7)
Neutrophils Relative %: 25 %
Platelets: 196 10*3/uL (ref 150–400)
RBC: 3.64 MIL/uL — ABNORMAL LOW (ref 3.87–5.11)
RDW: 15 % (ref 11.5–15.5)
WBC: 6 10*3/uL (ref 4.0–10.5)
nRBC: 0 % (ref 0.0–0.2)

## 2021-08-13 LAB — MAGNESIUM: Magnesium: 2 mg/dL (ref 1.7–2.4)

## 2021-08-13 LAB — COMPREHENSIVE METABOLIC PANEL
ALT: 17 U/L (ref 0–44)
AST: 21 U/L (ref 15–41)
Albumin: 4.2 g/dL (ref 3.5–5.0)
Alkaline Phosphatase: 103 U/L (ref 38–126)
Anion gap: 8 (ref 5–15)
BUN: 14 mg/dL (ref 8–23)
CO2: 24 mmol/L (ref 22–32)
Calcium: 7.9 mg/dL — ABNORMAL LOW (ref 8.9–10.3)
Chloride: 105 mmol/L (ref 98–111)
Creatinine, Ser: 1.16 mg/dL — ABNORMAL HIGH (ref 0.44–1.00)
GFR, Estimated: 51 mL/min — ABNORMAL LOW (ref >60)
Glucose, Bld: 92 mg/dL (ref 70–99)
Potassium: 3.8 mmol/L (ref 3.5–5.1)
Sodium: 137 mmol/L (ref 135–145)
Total Bilirubin: 0.5 mg/dL (ref 0.3–1.2)
Total Protein: 8.4 g/dL — ABNORMAL HIGH (ref 6.5–8.1)

## 2021-08-13 MED ORDER — CLOTRIMAZOLE-BETAMETHASONE 1-0.05 % EX CREA: TOPICAL_CREAM | Freq: Two times a day (BID) | CUTANEOUS | 0 refills | Status: DC

## 2021-08-13 MED ORDER — HYDROCODONE-ACETAMINOPHEN 5-325 MG PO TABS: 1.0000 | ORAL_TABLET | Freq: Four times a day (QID) | ORAL | 0 refills | Status: DC | PRN

## 2021-08-13 NOTE — Progress Notes (Signed)
Symptom Management [place] at [facility] [phone] [phone]  Patient Care Team: [facility], [place] as PCP - General (General Practice) End, Harrell Gave, MD as PCP - Cardiology (Cardiology) [name], [name] [name], MD as Consulting Physician (Hematology and Oncology) Ottie Glazier, MD as Consulting Physician (Pulmonary Disease) Gabriel Carina Betsey Holiday, MD as Consulting Physician (Endocrinology)   Name of the patient: Brandi [name]  [id number]  08/24/1950   Date of visit: 08/13/21  Reason for Consult:  Brandi [name] is a 71 year old woman with multiple medical problems including relapsed multiple myeloma status post stem cell transplant on salvage treatment with daratumumab-Revlimid-dexamethasone.  Patient last saw Dr. Rogue Bussing on 07/03/2021 at which time Revlimid was restarted on Revlimid.  Patient was hospitalized in November 2022 for pneumonia.  Patient presents to [facility] today for evaluation of redness around her Port-A-Cath.  Patient reports about two weeks of superficial reddened, scaly rash over her port.  She denies pain or itching.  However, she has had some worsened muscle cramping including some neck pain.  Patient reports similar neck pain to when she previously had a port that developed a DVT requiring removal.  Denies any neurologic complaints. Denies recent fevers or illnesses. Denies any easy bleeding or bruising. Reports good appetite and denies weight loss. Denies chest pain. Denies any nausea, vomiting, constipation, or diarrhea. Denies urinary complaints. Patient offers no further specific complaints today.  PAST MEDICAL HISTORY: Past Medical History:  Diagnosis Date   (HFpEF) heart failure with preserved ejection fraction ([place])    a. 06/2020 Echo: EF 60-65%, no rwma, nl RV size/fxn. Triv MR. Triv TR/AI. Mod elev PASP.   Anxiety     COPD (chronic obstructive pulmonary disease) (HCC)    Hypertension    Hyperthyroidism    Hypokalemia    IgA myeloma (HCC)    Multiple myeloma (HCC)    Pneumonia    Red blood cell antibody positive, compatible PRBC difficult to obtain    S/P autologous bone marrow transplantation ([place])     PAST SURGICAL HISTORY:  Past Surgical History:  Procedure Laterality Date   ESOPHAGOGASTRODUODENOSCOPY (EGD) WITH PROPOFOL N/A 03/30/2019   Procedure: ESOPHAGOGASTRODUODENOSCOPY (EGD) WITH PROPOFOL;  Surgeon: Jonathon Bellows, MD;  Location: [facility] ENDOSCOPY;  Service: Gastroenterology;  Laterality: N/A;   IR FLUORO GUIDE PORT INSERTION RIGHT  12/16/2017   IR IMAGING GUIDED PORT INSERTION  06/15/2021   THYROIDECTOMY N/A     HEMATOLOGY/ONCOLOGY HISTORY:  Oncology History Overview Note  # SEP 2018- MULTIPLE MYELOMA IgALamda [2.5 gm/dl; K/L= 88/1298]; STAGE III [beta 2 microglobulin=5.5] [presented with acute renal failure; anemia; NO hypercalcemia; Skeletal survey-Normal]; BMBx- 45% plasma cells; FISH-POSITIVE 11:14 translocation.[STANDARD-high RISK]/cyto-Normal; SEP 2018- PET- L3 posterior element lesion.   # 9/14- velcade SQ twice weekly/Dex 40 mg/week; OCT 5th 2018-Start R [55m]VD; 3cycles of RVD- PARTAL RESPONSE  # Jan 11th 2019-Dara-Rev-Dex; April 2019- BMBx- plasma cell -by CD-138/IHC-80% [baseline Sep 2018- 85% ]; HOLD transplant [dw Dr.Gasperatto]  # April 29th 2019 2019- carfil-Cyt-Dex; AUG 6th BMBx- 6% plasma cells; VGPR  # Autologous stem cell transplant on 06/15/18 [[facility]/ Dr.Gasperrato]  # may 1st week-2019- Maintenance Revlimid 10 mg 3w/1w;   FEB 10th 2021- [[facility]]Cellular marrow (50%) with normal trilineage hematopoiesis. No morphologic support for residual myeloma disease. Negative for minimal residual disease by MM-MRD flow cytometry; HOLD REVLIMID [leg swelling]; AUG 2021-PET scan negative for myeloma; continue to hold Revlimid  # MARCH 2021-diastolic congestive heart failure  [Dr.End]  #  BMBx- OCT 2021- BMBx- 5% plasmacytosis-however this appears to be more polyclonal rather than monoclonal-not explain patient worsening anemia.   # OCT 2022- 18th- Dara- Rev-Dex  #November 2021-hyperthyroidism/goiter- [D.Bennett/Dr.Solum]-methimazole. S/p Thyroidectomy [Dr.Kim; [facility] [id number]]  --------------------------  # 12/12- RIGHT JUGULAR DVT-x 54mon xarelto; finished April 2020; September 2020-EGD/dysphagia; Dr. AVicente Males # Acute renal failure [Dr.Singh; Proteinuria 1.5gm/day ]; acyclovir/Asprin ------------------------------------------------------------------------------------------------------------   DIAGNOSIS: '[ ]'  MULTIPLE MYELOMA  STAGE: III/HIGH RISK ;GOALS: CONTROL  CURRENT/MOST RECENT THERAPY-maintenance Revlimid- on HOLD    [place]  11/21/2017 - 04/28/2018 Chemotherapy   Patient is on Treatment Plan : MYELOMA SALVAGE Cyclophosphamide / Carfilzomib / Dexamethasone (CCd) q28d     05/12/2021 -  Chemotherapy   Patient is on Treatment Plan : MYELOMA RELAPSED REFRACTORY Daratumumab SQ + Lenalidomide + Dexamethasone (DaraRd) q28d       ALLERGIES:  has No Known Allergies.  MEDICATIONS:  Current Outpatient Medications  Medication Sig Dispense Refill   acyclovir (ZOVIRAX) 400 MG tablet Take 1 tablet (400 mg total) by mouth 2 (two) times daily. 60 tablet 3   albuterol (VENTOLIN HFA) 108 (90 Base) MCG/ACT inhaler Inhale 2 puffs into the lungs every 6 (six) hours as needed for wheezing or shortness of breath. (Patient not taking: Reported on 07/02/2021) 8 g 3   aspirin EC 81 MG EC tablet Take 1 tablet (81 mg total) by mouth daily. 90 tablet 3   benzonatate (TESSALON) 100 MG capsule Take 1 capsule (100 mg total) by mouth 3 (three) times daily as needed for cough. (Patient not taking: Reported on 07/02/2021) 20 capsule 0   buPROPion (WELLBUTRIN) 75 MG tablet Take 75 mg by mouth daily.     calcium-vitamin D (OSCAL WITH D) 500MG-200UNIT (5MCG) tablet Take 1  tablet by mouth 2 (two) times daily. 60 tablet 0   chlorpheniramine-HYDROcodone (TUSSIONEX) 10-8 MG/5ML SUER Take 5 mLs by mouth every 12 (twelve) hours as needed for cough. (Patient not taking: Reported on 07/02/2021) 140 mL 0   dexamethasone (DECADRON) 4 MG tablet Start 2 days prior to infusion; Take for 2 days. Do not take on the day of infusion. (Patient not taking: Reported on 07/02/2021) 60 tablet 3   feeding supplement, ENSURE ENLIVE, (ENSURE ENLIVE) LIQD Take 237 mLs by mouth 2 (two) times daily between meals. 60 Bottle 0   furosemide (LASIX) 40 MG tablet Take 1 tablet (40 mg total) by mouth daily. 90 tablet 3   guaiFENesin-codeine 100-10 MG/5ML syrup TAKE 5 ML BY MOUTH  THREE TIMES DAILY AS NEEDED FOR COUGH. MAY CAUSE DROWSINESS. DO NOT DRIVE WITH THIS MEDICATION (Patient not taking: Reported on 07/02/2021) 120 mL 0   Ipratropium-Albuterol (COMBIVENT) 20-100 MCG/ACT AERS respimat Inhale into the lungs. (Patient not taking: Reported on 07/02/2021)     lenalidomide (REVLIMID) 15 MG capsule Take 1 capsule (15 mg total) by mouth daily. Take 1 capsule daily for 3 weeks and 1 week  off   [employer] Auth # [id number]   Date Obtained 07/03/2021 21 capsule 1   levothyroxine (SYNTHROID) 125 MCG tablet Take 1 tablet (125 mcg total) by mouth daily before breakfast. 30 tablet 4   lidocaine-prilocaine (EMLA) cream Apply 1 application topically as needed. Apply to port and cover with saran wrap 1-2 hours prior to port access 30 g 1   metoprolol succinate (TOPROL-XL) 25 MG 24 hr tablet Take 1.5 tablets (37.5 mg total) by mouth daily. TAKE 1 & 1/2 (ONE & ONE-HALF) TABLETS BY MOUTH ONCE DAILY 135 tablet 0  montelukast (SINGULAIR) 10 MG tablet Take 1 tablet (10 mg total) by mouth at bedtime. 30 tablet 0   ondansetron (ZOFRAN) 8 MG tablet One pill every 8 hours as needed for nausea/vomitting. (Patient not taking: Reported on 06/11/2021) 40 tablet 1   pantoprazole (PROTONIX) 40 MG tablet Take 1 tablet by mouth once daily 90  tablet 0   polyethylene glycol (MIRALAX / GLYCOLAX) packet Take 17 g by mouth daily as needed for mild constipation. (Patient not taking: Reported on 07/02/2021) 14 each 0   potassium chloride SA (KLOR-CON) 20 MEQ tablet Take 1 tablet (20 mEq total) by mouth 2 (two) times daily. 60 tablet 6   spironolactone (ALDACTONE) 25 MG tablet Take 1 tablet by mouth once daily 90 tablet 0   Vitamin D, Ergocalciferol, (DRISDOL) 1.25 MG (50000 UNIT) CAPS capsule Take 1 capsule by mouth once a week 12 capsule 0   No current facility-administered medications for this visit.   Facility-Administered Medications Ordered in Other Visits  Medication Dose Route Frequency Provider Last Rate Last Admin   0.9 %  sodium chloride infusion   Intravenous Continuous Monia Sabal, PA-C        VITAL SIGNS: There were no vitals taken for this visit. There were no vitals filed for this visit.  Estimated body mass index is 31.83 kg/m as calculated from the following:   Height as of 07/02/21: '5\' 2"'  (1.575 m).   Weight as of 07/03/21: 174 lb (78.9 kg).  LABS: CBC:    Component Value Date/Time   WBC 7.0 07/03/2021 1032   HGB 10.4 (L) 07/03/2021 1032   HCT 32.9 (L) 07/03/2021 1032   PLT 205 07/03/2021 1032   MCV 90.6 07/03/2021 1032   NEUTROABS 1.8 07/03/2021 1032   LYMPHSABS 4.4 (H) 07/03/2021 1032   MONOABS 0.4 07/03/2021 1032   EOSABS 0.4 07/03/2021 1032   BASOSABS 0.0 07/03/2021 1032   Comprehensive Metabolic Panel:    Component Value Date/Time   NA 138 07/03/2021 1032   NA 141 01/25/2020 1530   K 3.0 (L) 07/03/2021 1032   CL 102 07/03/2021 1032   CO2 22 07/03/2021 1032   BUN 10 07/03/2021 1032   BUN 19 01/25/2020 1530   CREATININE 1.02 (H) 07/03/2021 1032   GLUCOSE 81 07/03/2021 1032   CALCIUM 7.5 (L) 07/03/2021 1032   AST 21 07/03/2021 1032   ALT 13 07/03/2021 1032   ALKPHOS 105 07/03/2021 1032   BILITOT 0.4 07/03/2021 1032   PROT 7.8 07/03/2021 1032   ALBUMIN 3.7 07/03/2021 1032     RADIOGRAPHIC STUDIES: No results found.  PERFORMANCE STATUS (ECOG) : 1 - Symptomatic but completely ambulatory  Review of Systems Unless otherwise noted, a complete review of systems is negative.  Physical Exam General: NAD Pulmonary: Unlabored Extremities: no edema, no joint deformities Skin: Erythematous, scaly/dermatitis over port.  No fluctuance.  Nontender to touch.  No streaking Neurological: Weakness but otherwise nonfocal     Assessment and Plan- Patient is a 71 y.o. female with multiple medical problems including relapsed multiple myeloma status post stem cell transplant on salvage treatment with daratumumab-Revlimid-dexamethasone.  Patient presents to [facility] for evaluation of a rash over her port   Rash -we will start Lotrisone cream and see if this improves the rash.  We will also send for ultrasound to rule out DVT.    Muscle cramping -we will send for labs today and check CMP and magnesium.  Can consider calcium infusion if needed.  Patient has been taking  occasional Norco for cramping and asked for this to be refilled.  PDMP reviewed.  Rx sent to pharmacy.  Case and plan discussed with Dr. Rogue Bussing who will see patient on 1/26 for evaluation.  Patient knows she can see [place] sooner if needed.   Patient expressed understanding and was in agreement with this plan. She also understands that She can call clinic at any time with any questions, concerns, or complaints.   Thank you for allowing me to participate in the care of this very pleasant patient.   Time Total: 15 minutes  Visit consisted of counseling and education dealing with the complex and emotionally intense issues of symptom management in the setting of serious illness.Greater than 50%  of this time was spent counseling and coordinating care related to the above assessment and plan.  Signed by: Altha Harm, PhD, NP-C

## 2021-08-13 NOTE — Progress Notes (Signed)
Pt presents to [facility] with concern of a rash to her port site. States that rash appeared about 2 weeks ago. Rash appears red/plaque like. Pt denies itching to the rash, but reports itching to the surrounding skin. Pt is also c/o generalized cramping and is asking if blood work can be drawn to check calcium levels, as she has a history of low calcium.

## 2021-08-14 ENCOUNTER — Inpatient Hospital Stay: Payer: Medicare Other

## 2021-08-14 VITALS — BP 117/60 | HR 46 | Temp 98.8°F | Resp 17

## 2021-08-14 DIAGNOSIS — C9002 Multiple myeloma in relapse: Secondary | ICD-10-CM | POA: Diagnosis not present

## 2021-08-14 MED ORDER — SODIUM CHLORIDE 0.9 % IV SOLN
2.0000 g | Freq: Once | INTRAVENOUS | Status: AC
  Administered 2021-08-14: 2 g via INTRAVENOUS
  Filled 2021-08-14: qty 20

## 2021-08-14 NOTE — Progress Notes (Signed)
Being treated for a rash on her port. Peripheral IV access in R AC. Received 2 gm calcium gluconate over 2 hours. At completion pt is discharged to home.

## 2021-08-20 ENCOUNTER — Inpatient Hospital Stay: Payer: Medicare Other

## 2021-08-20 ENCOUNTER — Other Ambulatory Visit: Payer: Self-pay

## 2021-08-20 ENCOUNTER — Encounter: Payer: Self-pay | Admitting: Internal Medicine

## 2021-08-20 ENCOUNTER — Inpatient Hospital Stay (HOSPITAL_BASED_OUTPATIENT_CLINIC_OR_DEPARTMENT_OTHER): Payer: Medicare Other | Admitting: Internal Medicine

## 2021-08-20 ENCOUNTER — Other Ambulatory Visit: Payer: Self-pay | Admitting: *Deleted

## 2021-08-20 DIAGNOSIS — C9002 Multiple myeloma in relapse: Secondary | ICD-10-CM

## 2021-08-20 DIAGNOSIS — Z23 Encounter for immunization: Secondary | ICD-10-CM

## 2021-08-20 DIAGNOSIS — C9000 Multiple myeloma not having achieved remission: Secondary | ICD-10-CM

## 2021-08-20 LAB — COMPREHENSIVE METABOLIC PANEL
ALT: 13 U/L (ref 0–44)
AST: 18 U/L (ref 15–41)
Albumin: 3.9 g/dL (ref 3.5–5.0)
Alkaline Phosphatase: 98 U/L (ref 38–126)
Anion gap: 11 (ref 5–15)
BUN: 15 mg/dL (ref 8–23)
CO2: 24 mmol/L (ref 22–32)
Calcium: 8.4 mg/dL — ABNORMAL LOW (ref 8.9–10.3)
Chloride: 105 mmol/L (ref 98–111)
Creatinine, Ser: 1.15 mg/dL — ABNORMAL HIGH (ref 0.44–1.00)
GFR, Estimated: 52 mL/min — ABNORMAL LOW (ref >60)
Glucose, Bld: 94 mg/dL (ref 70–99)
Potassium: 3.7 mmol/L (ref 3.5–5.1)
Sodium: 140 mmol/L (ref 135–145)
Total Bilirubin: 0.5 mg/dL (ref 0.3–1.2)
Total Protein: 8.5 g/dL — ABNORMAL HIGH (ref 6.5–8.1)

## 2021-08-20 LAB — CBC WITH DIFFERENTIAL/PLATELET
Abs Immature Granulocytes: 0.01 10*3/uL (ref 0.00–0.07)
Basophils Absolute: 0.1 10*3/uL (ref 0.0–0.1)
Basophils Relative: 1 %
Eosinophils Absolute: 0.4 10*3/uL (ref 0.0–0.5)
Eosinophils Relative: 5 %
HCT: 32.6 % — ABNORMAL LOW (ref 36.0–46.0)
Hemoglobin: 10.6 g/dL — ABNORMAL LOW (ref 12.0–15.0)
Immature Granulocytes: 0 %
Lymphocytes Relative: 68 %
Lymphs Abs: 5.2 10*3/uL — ABNORMAL HIGH (ref 0.7–4.0)
MCH: 29.4 pg (ref 26.0–34.0)
MCHC: 32.5 g/dL (ref 30.0–36.0)
MCV: 90.3 fL (ref 80.0–100.0)
Monocytes Absolute: 0.4 10*3/uL (ref 0.1–1.0)
Monocytes Relative: 6 %
Neutro Abs: 1.5 10*3/uL — ABNORMAL LOW (ref 1.7–7.7)
Neutrophils Relative %: 20 %
Platelets: 227 10*3/uL (ref 150–400)
RBC: 3.61 MIL/uL — ABNORMAL LOW (ref 3.87–5.11)
RDW: 15.1 % (ref 11.5–15.5)
WBC: 7.6 10*3/uL (ref 4.0–10.5)
nRBC: 0 % (ref 0.0–0.2)

## 2021-08-20 LAB — TSH: TSH: 8.777 u[IU]/mL — ABNORMAL HIGH (ref 0.350–4.500)

## 2021-08-20 MED ORDER — LENALIDOMIDE 15 MG PO CAPS: 15.0000 mg | ORAL_CAPSULE | Freq: Every day | ORAL | 1 refills | Status: DC

## 2021-08-20 MED ORDER — INFLUENZA VAC A&B SA ADJ QUAD 0.5 ML IM PRSY
0.5000 mL | PREFILLED_SYRINGE | Freq: Once | INTRAMUSCULAR | Status: AC
  Administered 2021-08-20: 0.5 mL via INTRAMUSCULAR
  Filled 2021-08-20: qty 0.5

## 2021-08-20 MED ORDER — DEXAMETHASONE 4 MG PO TABS: ORAL_TABLET | ORAL | 3 refills | Status: DC

## 2021-08-20 NOTE — Progress Notes (Signed)
Pt states she still gets body cramps that come and go.  Pt states she is having some back pain, lower back area x6 months. Mostly when standing, pain goes away when sitting. Seems to be getting worse.

## 2021-08-20 NOTE — Progress Notes (Signed)
[place] OFFICE PROGRESS NOTE  Patient Care Team: Marguerita Merles, MD as PCP - General (Family Medicine) End, Harrell Gave, MD as PCP - Cardiology (Cardiology) [name], [name] [name], MD as Consulting Physician (Hematology and Oncology) Ottie Glazier, MD as Consulting Physician (Pulmonary Disease) Gabriel Carina Betsey Holiday, MD as Consulting Physician (Endocrinology)   Cancer Staging  No matching staging information was found for the patient.   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 No morphologic support for residual myeloma disease. Negative for minimal residual disease by MM-MRD flow cytometry; HOLD REVLIMID [leg swelling]; AUG 2021-PET scan negative for myeloma; continue to hold Revlimid  # MARCH 2021-diastolic congestive heart failure [Dr.End]  # BMBx- OCT 2021- BMBx- 5% plasmacytosis-however this appears to be more polyclonal rather than monoclonal-not explain patient worsening anemia.   # OCT 2022- 18th- Dara- Rev-Dex  #November 2021-hyperthyroidism/goiter-  [D.Bennett/Dr.Solum]-methimazole. S/p Thyroidectomy [Dr.Kim; [facility]G 26389] --------------------------  # 12/12- RIGHT JUGULAR DVT-x 374mn xarelto; finished April 2020; September 2020-EGD/dysphagia; Dr. AnVicente Males# Acute renal failure [Dr.Singh; Proteinuria 1.5gm/day ]; acyclovir/Asprin ------------------------------------------------------------------------------------------------------------   DIAGNOSIS: '[ ]'  MULTIPLE MYELOMA  STAGE: III/HIGH RISK ;GOALS: CONTROL  CURRENT/MOST RECENT THERAPY-maintenance Revlimid- on HOLD    [place] 04/28/2018 Chemotherapy   Patient is on Treatment Plan : MYELOMA SALVAGE Cyclophosphamide / Carfilzomib / Dexamethasone (CCd) q28d     05/12/2021 -  Chemotherapy   Patient is on Treatment Plan : MYELOMA RELAPSED REFRACTORY Daratumumab SQ + Lenalidomide + Dexamethasone (DaraRd) q28d      INTERVAL HISTORY: Ambulating independently.  Alone.. [name].  female pleasant patient above history of relapsed multiple myeloma--currently on daratumumab-Revlimid-dexamethasone [s/p 2 infusions of Dara] is here for follow-up.  In the interim patient also had a Mediport placed.  Patient's current therapy is interrupted because of recent hospitalization abdominal wall hematoma.   Patient however finished her cycle #1 of Revlimid-2 days ago.  No nausea no vomiting no diarrhea.  patient is currently home.  Denies any worsening shortness of breath or cough.  No fever no chills. She feels back to baseline.  Review of Systems  Constitutional:  Positive for malaise/fatigue. Negative for chills, diaphoresis and fever.  HENT:  Negative for nosebleeds and sore throat.   Eyes:  Negative for double vision.  Respiratory:  Negative for hemoptysis.   Cardiovascular:  Negative for chest pain, palpitations and orthopnea.  Gastrointestinal:  Negative for abdominal pain, blood in stool, constipation, heartburn and melena.  Genitourinary:   Negative for dysuria, frequency and urgency.  Musculoskeletal:  Positive for back pain.  Skin: Negative.  Negative for itching and rash.  Neurological:  Negative for dizziness, tingling, focal weakness, weakness and headaches.  Endo/Heme/Allergies:  Does not bruise/bleed easily.  Psychiatric/Behavioral:  Negative for depression. The patient is not nervous/anxious.    PAST MEDICAL HISTORY :  Past Medical History:  Diagnosis Date   (HFpEF) heart failure with preserved ejection fraction ([place])    a. 06/2020 Echo: EF 60-65%, no rwma, nl RV size/fxn. Triv MR. Triv TR/AI. Mod elev PASP.   Anxiety    COPD (chronic obstructive pulmonary disease) (HCC)    Hypertension    Hyperthyroidism    Hypokalemia    IgA myeloma (HCC)    Multiple myeloma (HCC)    Pneumonia    Red blood cell antibody positive, compatible PRBC difficult to obtain    S/P autologous bone marrow transplantation ([place])     PAST SURGICAL HISTORY :   Past Surgical History:  Procedure Laterality Date   ESOPHAGOGASTRODUODENOSCOPY (EGD) WITH PROPOFOL N/A 03/30/2019   Procedure: ESOPHAGOGASTRODUODENOSCOPY (EGD) WITH PROPOFOL;  Surgeon: Jonathon Bellows, MD;  Location: [facility] ENDOSCOPY;  Service: Gastroenterology;  Laterality: N/A;   IR FLUORO GUIDE PORT INSERTION RIGHT  12/16/2017   IR IMAGING GUIDED PORT INSERTION  06/15/2021   THYROIDECTOMY N/A     FAMILY HISTORY :   Family History  Problem Relation Age of Onset   Pneumonia Mother    Seizures Father     SOCIAL HISTORY:   Social History   Tobacco Use   Smoking status: Former    Packs/day: 0.25    Types: Cigarettes    Quit date: 03/18/2021    Years since quitting: 0.5   Smokeless tobacco: Never   Tobacco comments:    2 cigarettes QOD  Vaping Use   Vaping Use: Never used  Substance Use Topics   Alcohol use: No   Drug use: No    ALLERGIES:  has No Known Allergies.  MEDICATIONS:  Current Outpatient Medications  Medication Sig Dispense Refill   acyclovir (ZOVIRAX)  400 MG tablet Take 1 tablet (400 mg total) by mouth 2 (two) times daily. 60 tablet 3   albuterol (VENTOLIN HFA) 108 (90 Base) MCG/ACT inhaler Inhale 2 puffs into the lungs every 6 (six) hours as needed for wheezing or shortness of breath. 8 g 3   aspirin EC 81 MG EC tablet Take 1 tablet (81 mg total) by mouth daily. 90 tablet 3   buPROPion (WELLBUTRIN) 75 MG tablet Take 75 mg by mouth daily.     calcium-vitamin D (OSCAL WITH D) 500MG-200UNIT (5MCG) tablet Take 1 tablet by mouth 2 (two) times daily. 60 tablet 0   clotrimazole-betamethasone (LOTRISONE) cream Apply topically 2 (two) times daily. 30 g 0   feeding supplement, ENSURE ENLIVE, (ENSURE ENLIVE) LIQD Take 237 mLs by mouth 2 (two) times daily between meals. 60 Bottle 0   furosemide (LASIX) 40 MG tablet Take 1 tablet (40 mg total) by mouth daily. (Patient not taking: Reported on 08/30/2021) 90 tablet 3   HYDROcodone-acetaminophen (NORCO) 5-325 MG tablet Take 1 tablet by mouth every 6 (six) hours as needed for moderate pain. 30 tablet 0   levothyroxine (SYNTHROID) 125 MCG tablet Take 1 tablet (125 mcg total) by mouth daily before breakfast. 30 tablet 4   lidocaine-prilocaine (EMLA) cream Apply 1 application topically as needed. Apply to port and cover with saran wrap 1-2 hours prior to port access 30 g 1   metoprolol succinate (TOPROL-XL) 25 MG 24 hr tablet Take 1.5 tablets (37.5 mg total) by mouth daily. TAKE 1 & 1/2 (ONE & ONE-HALF) TABLETS BY MOUTH ONCE DAILY 135 tablet 0   montelukast (  SINGULAIR) 10 MG tablet Take 1 tablet (10 mg total) by mouth at bedtime. 30 tablet 0   pantoprazole (PROTONIX) 40 MG tablet Take 1 tablet by mouth once daily 90 tablet 0   polyethylene glycol (MIRALAX / GLYCOLAX) packet Take 17 g by mouth daily as needed for mild constipation. 14 each 0   potassium chloride SA (KLOR-CON) 20 MEQ tablet Take 1 tablet (20 mEq total) by mouth 2 (two) times daily. 60 tablet 6   spironolactone (ALDACTONE) 25 MG tablet Take 1 tablet by  mouth once daily 90 tablet 0   Vitamin D, Ergocalciferol, (DRISDOL) 1.25 MG (50000 UNIT) CAPS capsule Take 1 capsule by mouth once a week 12 capsule 0   dexamethasone (DECADRON) 4 MG tablet Start 2 days prior to infusion; Take for 2 days. Do not take on the day of infusion. 60 tablet 3   guaiFENesin-codeine 100-10 MG/5ML syrup TAKE 5 ML BY MOUTH  THREE TIMES DAILY AS NEEDED FOR COUGH. MAY CAUSE DROWSINESS. DO NOT DRIVE WITH THIS MEDICATION (Patient not taking: Reported on 09/17/2021) 120 mL 0   lenalidomide (REVLIMID) 15 MG capsule Take 1 capsule (15 mg total) by mouth daily. Take 1 capsule daily for 3 weeks and 1 week  off 21 capsule 1   ondansetron (ZOFRAN) 8 MG tablet One pill every 8 hours as needed for nausea/vomitting. (Patient not taking: Reported on 06/11/2021) 40 tablet 1   No current facility-administered medications for this visit.   Facility-Administered Medications Ordered in Other Visits  Medication Dose Route Frequency Provider Last Rate Last Admin   0.9 %  sodium chloride infusion   Intravenous Continuous Monia Sabal, PA-C        PHYSICAL EXAMINATION: ECOG PERFORMANCE STATUS: 1 - Symptomatic but completely ambulatory  BP 134/77 (BP Location: Left Arm, Patient Position: Sitting, Cuff Size: Normal)    Pulse (!) 44    Temp 98.3 F (36.8 C) (Tympanic)    Ht '5\' 2"'  (1.575 m)    Wt 180 lb (81.6 kg)    SpO2 100%    BMI 32.92 kg/m   Filed Weights   08/20/21 1025  Weight: 180 lb (81.6 kg)    Physical Exam HENT:     Head: Normocephalic and atraumatic.  Eyes:     Pupils: Pupils are equal, round, and reactive to light.  Cardiovascular:     Rate and Rhythm: Normal rate and regular rhythm.  Pulmonary:     Effort: Pulmonary effort is normal. No respiratory distress.     Breath sounds: Normal breath sounds. No wheezing.  Abdominal:     General: Bowel sounds are normal. There is no distension.     Palpations: Abdomen is soft. There is no mass.     Tenderness: There is no  abdominal tenderness. There is no guarding or rebound.  Musculoskeletal:        General: No tenderness. Normal range of motion.     Cervical back: Normal range of motion and neck supple.  Skin:    General: Skin is warm.  Neurological:     Mental Status: She is alert and oriented to person, place, and time.  Psychiatric:        Mood and Affect: Affect normal.   LABORATORY DATA:  I have reviewed the data as listed    Component Value Date/Time   NA 138 09/17/2021 0847   NA 141 01/25/2020 1530   K 3.7 09/17/2021 0847   CL 107 09/17/2021 0847   CO2 22 09/17/2021  0847   GLUCOSE 99 09/17/2021 0847   BUN 12 09/17/2021 0847   BUN 19 01/25/2020 1530   CREATININE 0.99 09/17/2021 0847   CALCIUM 9.0 09/17/2021 0847   PROT 8.6 (H) 09/17/2021 0847   ALBUMIN 3.8 09/17/2021 0847   AST 33 09/17/2021 0847   ALT 15 09/17/2021 0847   ALKPHOS 67 09/17/2021 0847   BILITOT 0.2 (L) 09/17/2021 0847   GFRNONAA >60 09/17/2021 0847   GFRAA NOT CALCULATED 04/17/2020 1352    No results found for: SPEP, UPEP  Lab Results  Component Value Date   WBC 8.1 09/17/2021   NEUTROABS 4.5 09/17/2021   HGB 9.9 (L) 09/17/2021   HCT 31.0 (L) 09/17/2021   MCV 92.0 09/17/2021   PLT 218 09/17/2021      Chemistry      Component Value Date/Time   NA 138 09/17/2021 0847   NA 141 01/25/2020 1530   K 3.7 09/17/2021 0847   CL 107 09/17/2021 0847   CO2 22 09/17/2021 0847   BUN 12 09/17/2021 0847   BUN 19 01/25/2020 1530   CREATININE 0.99 09/17/2021 0847      Component Value Date/Time   CALCIUM 9.0 09/17/2021 0847   ALKPHOS 67 09/17/2021 0847   AST 33 09/17/2021 0847   ALT 15 09/17/2021 0847   BILITOT 0.2 (L) 09/17/2021 0847     Results for JAMARIAH, TONY (MRN [id number]) as of 04/28/2021 16:46  Ref. Range 10/24/2020 13:16 12/24/2020 14:01 01/14/2021 14:13 04/01/2021 14:34 04/28/2021 09:17  Kappa free light chain Latest Ref Range: 3.3 - 19.4 mg/L 23.1 (H) 16.8  12.4   Lambda free light chains Latest Ref  Range: 5.7 - 26.3 mg/L 36.6 (H) 41.1 (H)  156.5 (H)   Kappa, lambda light chain ratio Latest Ref Range: 0.26 - 1.65  0.63 0.41  0.08 (L)      RADIOGRAPHIC STUDIES: I have personally reviewed the radiological images as listed and agreed with the findings in the report. No results found.   ASSESSMENT & PLAN:  Multiple myeloma ([place]) # RECURRENT Multiple myeloma stage III [high-risk cytogenetics-status post KRD-VGPR ; status post autologous stem cell transplant on 06/15/18. NOV 2022 M protein-0.5 g/dL.  Kappa/lambda light chain ratio 0.08.   # Most recently on salvage therapy with daratumumab; lenalidomide [15 mg 3 w; 1-w] dexamethasone. However therapy interrupted for recent hospitalization/pneumonia see below.   # RE-START REVLIMID 15 mg again. [last dose dec 7th]; res-tart dec 14th- 3 weeks/1 week off; continue revlimid single agent- plan to add dara next visit/1 month  # Iatrogenic hypothyroidism [ Graves/goiter s/p total thyroidectomy;aug,2022 ]- STABLE; DEC TSH-10.7; increased to 125 mc/day; check TSH today.  # Chronic diastolic CHF-clinically stable at this time.  # Bone lesions /last zometa on 11/27/2017.  Holding Zometa secondary to poor dentition.  # DISPOSITION:  # ADD TSH today # Flu shot today #  Follow up 4  Weeks-- MD; labs-cbc//cmp;:MM panle; K/l light chain ratio;thyroid profile Dara IV-Dr.B       Orders Placed This Encounter  Procedures   Thyroid Profile    Standing Status:   Future    Number of Occurrences:   1    Standing Expiration Date:   08/20/2022   TSH    Standing Status:   Future    Number of Occurrences:   1    Standing Expiration Date:   08/20/2022     All questions were answered. The patient knows to call the clinic with any problems, questions  or concerns.      [name], MD 09/17/2021 9:23 AM

## 2021-08-20 NOTE — Assessment & Plan Note (Addendum)
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 NOV 2022 M protein-0.5 g/dL.  Kappa/lambda light chain ratio 0.08.   # Most recently on salvage therapy with daratumumab; lenalidomide [15 mg 3 w; 1-w] dexamethasone. However therapy interrupted for recent hospitalization/pneumonia see below.   # RE-START REVLIMID 15 mg again. [last dose dec 7th]; res-tart dec 14th- 3 weeks/1 week off; continue revlimid single agent- plan to add dara next visit/1 month  # Iatrogenic hypothyroidism [ Graves/goiter s/p total thyroidectomy;aug,2022 ]- STABLE; DEC TSH-10.7; increased to 125 mc/day; check TSH today.  # Chronic diastolic CHF-clinically stable at this time.  # Bone lesions /last zometa on 11/27/2017.  Holding Zometa secondary to poor dentition.  # DISPOSITION:  # ADD TSH today # Flu shot today #  Follow up 4  Weeks-- MD; labs-cbc//cmp;:MM panle; K/l light chain ratio;thyroid profile Dara IV-Dr.B

## 2021-08-20 NOTE — Patient Instructions (Addendum)
#  Recommend pneumonia shot/check with your local pharmacist.  # recommend taking pre-medications prior to starting chemo infusion in 4 weeks- statr 2 days before.

## 2021-08-21 LAB — KAPPA/LAMBDA LIGHT CHAINS
Kappa free light chain: 8.5 mg/L (ref 3.3–19.4)
Kappa, lambda light chain ratio: 0.04 — ABNORMAL LOW (ref 0.26–1.65)
Lambda free light chains: 221.3 mg/L — ABNORMAL HIGH (ref 5.7–26.3)

## 2021-08-24 LAB — MULTIPLE MYELOMA PANEL, SERUM
Albumin SerPl Elph-Mcnc: 3.6 g/dL (ref 2.9–4.4)
Albumin/Glob SerPl: 0.9 (ref 0.7–1.7)
Alpha 1: 0.2 g/dL (ref 0.0–0.4)
Alpha2 Glob SerPl Elph-Mcnc: 0.7 g/dL (ref 0.4–1.0)
B-Globulin SerPl Elph-Mcnc: 1.7 g/dL — ABNORMAL HIGH (ref 0.7–1.3)
Gamma Glob SerPl Elph-Mcnc: 1.8 g/dL (ref 0.4–1.8)
Globulin, Total: 4.4 g/dL — ABNORMAL HIGH (ref 2.2–3.9)
IgA: 1268 mg/dL — ABNORMAL HIGH (ref 87–352)
IgG (Immunoglobin G), Serum: 472 mg/dL — ABNORMAL LOW (ref 586–1602)
IgM (Immunoglobulin M), Srm: 11 mg/dL — ABNORMAL LOW (ref 26–217)
M Protein SerPl Elph-Mcnc: 1.1 g/dL — ABNORMAL HIGH
Total Protein ELP: 8 g/dL (ref 6.0–8.5)

## 2021-08-29 ENCOUNTER — Inpatient Hospital Stay
Admission: EM | Admit: 2021-08-29 | Discharge: 2021-08-31 | DRG: 194 | Disposition: A | Discharge status: Home / Self Care | Payer: Medicare Other | Attending: Internal Medicine | Admitting: Internal Medicine

## 2021-08-29 ENCOUNTER — Other Ambulatory Visit: Payer: Self-pay

## 2021-08-29 ENCOUNTER — Inpatient Hospital Stay: Payer: Medicare Other

## 2021-08-29 ENCOUNTER — Emergency Department: Payer: Medicare Other

## 2021-08-29 DIAGNOSIS — J9601 Acute respiratory failure with hypoxia: Secondary | ICD-10-CM

## 2021-08-29 DIAGNOSIS — Z79899 Other long term (current) drug therapy: Secondary | ICD-10-CM

## 2021-08-29 DIAGNOSIS — J189 Pneumonia, unspecified organism: Secondary | ICD-10-CM | POA: Diagnosis not present

## 2021-08-29 DIAGNOSIS — J9621 Acute and chronic respiratory failure with hypoxia: Secondary | ICD-10-CM | POA: Diagnosis present

## 2021-08-29 DIAGNOSIS — I110 Hypertensive heart disease with heart failure: Secondary | ICD-10-CM | POA: Diagnosis present

## 2021-08-29 DIAGNOSIS — E0500 Thyrotoxicosis with diffuse goiter without thyrotoxic crisis or storm: Secondary | ICD-10-CM | POA: Diagnosis present

## 2021-08-29 DIAGNOSIS — J9611 Chronic respiratory failure with hypoxia: Secondary | ICD-10-CM | POA: Diagnosis present

## 2021-08-29 DIAGNOSIS — E039 Hypothyroidism, unspecified: Secondary | ICD-10-CM | POA: Diagnosis present

## 2021-08-29 DIAGNOSIS — C9000 Multiple myeloma not having achieved remission: Secondary | ICD-10-CM | POA: Diagnosis present

## 2021-08-29 DIAGNOSIS — D849 Immunodeficiency, unspecified: Secondary | ICD-10-CM | POA: Diagnosis present

## 2021-08-29 DIAGNOSIS — Z20822 Contact with and (suspected) exposure to covid-19: Secondary | ICD-10-CM | POA: Diagnosis present

## 2021-08-29 DIAGNOSIS — Z9484 Stem cells transplant status: Secondary | ICD-10-CM

## 2021-08-29 DIAGNOSIS — Z79890 Hormone replacement therapy (postmenopausal): Secondary | ICD-10-CM | POA: Diagnosis not present

## 2021-08-29 DIAGNOSIS — E669 Obesity, unspecified: Secondary | ICD-10-CM | POA: Diagnosis present

## 2021-08-29 DIAGNOSIS — R059 Cough, unspecified: Secondary | ICD-10-CM | POA: Diagnosis present

## 2021-08-29 DIAGNOSIS — J180 Bronchopneumonia, unspecified organism: Secondary | ICD-10-CM | POA: Diagnosis not present

## 2021-08-29 DIAGNOSIS — Z87891 Personal history of nicotine dependence: Secondary | ICD-10-CM

## 2021-08-29 DIAGNOSIS — Z6832 Body mass index (BMI) 32.0-32.9, adult: Secondary | ICD-10-CM | POA: Diagnosis not present

## 2021-08-29 DIAGNOSIS — R109 Unspecified abdominal pain: Secondary | ICD-10-CM | POA: Diagnosis present

## 2021-08-29 DIAGNOSIS — J440 Chronic obstructive pulmonary disease with acute lower respiratory infection: Secondary | ICD-10-CM | POA: Diagnosis present

## 2021-08-29 DIAGNOSIS — Z7982 Long term (current) use of aspirin: Secondary | ICD-10-CM | POA: Diagnosis not present

## 2021-08-29 DIAGNOSIS — F419 Anxiety disorder, unspecified: Secondary | ICD-10-CM | POA: Diagnosis present

## 2021-08-29 DIAGNOSIS — I5032 Chronic diastolic (congestive) heart failure: Secondary | ICD-10-CM | POA: Diagnosis present

## 2021-08-29 DIAGNOSIS — I10 Essential (primary) hypertension: Secondary | ICD-10-CM | POA: Diagnosis present

## 2021-08-29 DIAGNOSIS — N179 Acute kidney failure, unspecified: Secondary | ICD-10-CM | POA: Diagnosis present

## 2021-08-29 LAB — RESP PANEL BY RT-PCR (FLU A&B, COVID) ARPGX2
Influenza A by PCR: NEGATIVE
Influenza B by PCR: NEGATIVE
SARS Coronavirus 2 by RT PCR: NEGATIVE

## 2021-08-29 LAB — COMPREHENSIVE METABOLIC PANEL
ALT: 15 U/L (ref 0–44)
AST: 39 U/L (ref 15–41)
Albumin: 3.7 g/dL (ref 3.5–5.0)
Alkaline Phosphatase: 73 U/L (ref 38–126)
Anion gap: 8 (ref 5–15)
BUN: 13 mg/dL (ref 8–23)
CO2: 25 mmol/L (ref 22–32)
Calcium: 7.8 mg/dL — ABNORMAL LOW (ref 8.9–10.3)
Chloride: 104 mmol/L (ref 98–111)
Creatinine, Ser: 1.23 mg/dL — ABNORMAL HIGH (ref 0.44–1.00)
GFR, Estimated: 48 mL/min — ABNORMAL LOW (ref >60)
Glucose, Bld: 100 mg/dL — ABNORMAL HIGH (ref 70–99)
Potassium: 3.8 mmol/L (ref 3.5–5.1)
Sodium: 137 mmol/L (ref 135–145)
Total Bilirubin: 0.4 mg/dL (ref 0.3–1.2)
Total Protein: 8.3 g/dL — ABNORMAL HIGH (ref 6.5–8.1)

## 2021-08-29 LAB — CBC
HCT: 33.2 % — ABNORMAL LOW (ref 36.0–46.0)
Hemoglobin: 10.4 g/dL — ABNORMAL LOW (ref 12.0–15.0)
MCH: 29.1 pg (ref 26.0–34.0)
MCHC: 31.3 g/dL (ref 30.0–36.0)
MCV: 93 fL (ref 80.0–100.0)
Platelets: 182 10*3/uL (ref 150–400)
RBC: 3.57 MIL/uL — ABNORMAL LOW (ref 3.87–5.11)
RDW: 15.3 % (ref 11.5–15.5)
WBC: 7.3 10*3/uL (ref 4.0–10.5)
nRBC: 0 % (ref 0.0–0.2)

## 2021-08-29 LAB — LACTIC ACID, PLASMA: Lactic Acid, Venous: 1.1 mmol/L (ref 0.5–1.9)

## 2021-08-29 LAB — LIPASE, BLOOD: Lipase: 25 U/L (ref 11–51)

## 2021-08-29 MED ORDER — LACTATED RINGERS IV SOLN: INTRAVENOUS | Status: AC

## 2021-08-29 MED ORDER — LEVOTHYROXINE SODIUM 50 MCG PO TABS
125.0000 ug | ORAL_TABLET | Freq: Every day | ORAL | Status: DC
  Administered 2021-08-30 – 2021-08-31 (×2): 125 ug via ORAL
  Filled 2021-08-29 (×2): qty 3

## 2021-08-29 MED ORDER — MORPHINE SULFATE (PF) 4 MG/ML IV SOLN
4.0000 mg | INTRAVENOUS | Status: DC | PRN
  Administered 2021-08-29 (×2): 4 mg via INTRAVENOUS
  Filled 2021-08-29 (×3): qty 1

## 2021-08-29 MED ORDER — HYDROCOD POLI-CHLORPHE POLI ER 10-8 MG/5ML PO SUER
5.0000 mL | Freq: Two times a day (BID) | ORAL | Status: DC | PRN
  Administered 2021-08-30 (×2): 5 mL via ORAL
  Filled 2021-08-29 (×2): qty 5

## 2021-08-29 MED ORDER — SODIUM CHLORIDE 0.9 % IV SOLN: 500.0000 mg | INTRAVENOUS | Status: DC

## 2021-08-29 MED ORDER — METOPROLOL SUCCINATE ER 25 MG PO TB24
37.0000 mg | ORAL_TABLET | Freq: Every day | ORAL | Status: DC
  Filled 2021-08-29: qty 2

## 2021-08-29 MED ORDER — SODIUM CHLORIDE 0.9 % IV SOLN
2.0000 g | INTRAVENOUS | Status: DC
  Administered 2021-08-29 – 2021-08-30 (×2): 2 g via INTRAVENOUS
  Filled 2021-08-29 (×4): qty 20

## 2021-08-29 MED ORDER — ONDANSETRON HCL 4 MG PO TABS: 4.0000 mg | ORAL_TABLET | Freq: Four times a day (QID) | ORAL | Status: DC | PRN

## 2021-08-29 MED ORDER — ASPIRIN EC 81 MG PO TBEC
81.0000 mg | DELAYED_RELEASE_TABLET | Freq: Every day | ORAL | Status: DC
  Administered 2021-08-30 – 2021-08-31 (×2): 81 mg via ORAL
  Filled 2021-08-29 (×2): qty 1

## 2021-08-29 MED ORDER — ONDANSETRON HCL 4 MG/2ML IJ SOLN
4.0000 mg | Freq: Once | INTRAMUSCULAR | Status: AC
  Administered 2021-08-29: 4 mg via INTRAVENOUS
  Filled 2021-08-29: qty 2

## 2021-08-29 MED ORDER — BUPROPION HCL 75 MG PO TABS
75.0000 mg | ORAL_TABLET | Freq: Every day | ORAL | Status: DC
  Administered 2021-08-30 – 2021-08-31 (×2): 75 mg via ORAL
  Filled 2021-08-29 (×2): qty 1

## 2021-08-29 MED ORDER — HEPARIN SODIUM (PORCINE) 5000 UNIT/ML IJ SOLN
5000.0000 [IU] | Freq: Three times a day (TID) | INTRAMUSCULAR | Status: DC
  Administered 2021-08-30 – 2021-08-31 (×5): 5000 [IU] via SUBCUTANEOUS
  Filled 2021-08-29 (×5): qty 1

## 2021-08-29 MED ORDER — SODIUM CHLORIDE 0.9 % IV SOLN: 2.0000 g | INTRAVENOUS | Status: DC

## 2021-08-29 MED ORDER — ALBUTEROL SULFATE HFA 108 (90 BASE) MCG/ACT IN AERS: 2.0000 | INHALATION_SPRAY | Freq: Four times a day (QID) | RESPIRATORY_TRACT | Status: DC | PRN

## 2021-08-29 MED ORDER — SODIUM CHLORIDE 0.9 % IV BOLUS
500.0000 mL | Freq: Once | INTRAVENOUS | Status: AC
  Administered 2021-08-29: 500 mL via INTRAVENOUS

## 2021-08-29 MED ORDER — ACETAMINOPHEN 325 MG PO TABS: 650.0000 mg | ORAL_TABLET | Freq: Four times a day (QID) | ORAL | Status: DC | PRN

## 2021-08-29 MED ORDER — ONDANSETRON HCL 4 MG/2ML IJ SOLN: 4.0000 mg | Freq: Four times a day (QID) | INTRAMUSCULAR | Status: DC | PRN

## 2021-08-29 MED ORDER — SODIUM CHLORIDE 0.9 % IV SOLN
500.0000 mg | INTRAVENOUS | Status: DC
  Administered 2021-08-29 – 2021-08-30 (×2): 500 mg via INTRAVENOUS
  Filled 2021-08-29 (×3): qty 5

## 2021-08-29 MED ORDER — HYDRALAZINE HCL 20 MG/ML IJ SOLN: 5.0000 mg | INTRAMUSCULAR | Status: DC | PRN

## 2021-08-29 MED ORDER — SPIRONOLACTONE 25 MG PO TABS
25.0000 mg | ORAL_TABLET | Freq: Every day | ORAL | Status: DC
Start: 2021-08-30 — End: 2021-08-31
  Administered 2021-08-30 – 2021-08-31 (×2): 25 mg via ORAL
  Filled 2021-08-29 (×2): qty 1

## 2021-08-29 MED ORDER — HYDROCODONE-ACETAMINOPHEN 5-325 MG PO TABS
1.0000 | ORAL_TABLET | Freq: Four times a day (QID) | ORAL | Status: DC | PRN
  Administered 2021-08-30 – 2021-08-31 (×2): 1 via ORAL
  Filled 2021-08-29 (×2): qty 1

## 2021-08-29 MED ORDER — PANTOPRAZOLE SODIUM 40 MG PO TBEC
40.0000 mg | DELAYED_RELEASE_TABLET | Freq: Every day | ORAL | Status: DC
  Administered 2021-08-30 – 2021-08-31 (×2): 40 mg via ORAL
  Filled 2021-08-29 (×2): qty 1

## 2021-08-29 MED ORDER — BENZONATATE 100 MG PO CAPS
100.0000 mg | ORAL_CAPSULE | Freq: Three times a day (TID) | ORAL | Status: DC | PRN
  Administered 2021-08-30 (×2): 100 mg via ORAL
  Filled 2021-08-29 (×2): qty 1

## 2021-08-29 MED ORDER — GUAIFENESIN ER 600 MG PO TB12
600.0000 mg | ORAL_TABLET | Freq: Two times a day (BID) | ORAL | Status: DC | PRN
  Administered 2021-08-30 (×2): 600 mg via ORAL
  Filled 2021-08-29 (×2): qty 1

## 2021-08-29 MED ORDER — LACTATED RINGERS IV SOLN: INTRAVENOUS | Status: DC

## 2021-08-29 MED ORDER — ALBUTEROL SULFATE (2.5 MG/3ML) 0.083% IN NEBU: 2.5000 mg | INHALATION_SOLUTION | RESPIRATORY_TRACT | Status: DC | PRN

## 2021-08-29 MED ORDER — ACETAMINOPHEN 650 MG RE SUPP: 650.0000 mg | Freq: Four times a day (QID) | RECTAL | Status: DC | PRN

## 2021-08-29 MED ORDER — ALBUTEROL SULFATE (2.5 MG/3ML) 0.083% IN NEBU
2.5000 mg | INHALATION_SOLUTION | Freq: Once | RESPIRATORY_TRACT | Status: AC
  Administered 2021-08-29: 2.5 mg via RESPIRATORY_TRACT
  Filled 2021-08-29: qty 3

## 2021-08-29 MED ORDER — MONTELUKAST SODIUM 10 MG PO TABS
10.0000 mg | ORAL_TABLET | Freq: Every day | ORAL | Status: DC
  Administered 2021-08-30 (×2): 10 mg via ORAL
  Filled 2021-08-29 (×2): qty 1

## 2021-08-29 NOTE — ED Notes (Signed)
Pt is c/o abdominal pain in the right upper quadrant that start since yesterday. Pt states she has not had her gallbladder removed nor has had any abdominal surgeries. Pt is c/o of nausea but not vomiting or diarrhea. Pt states she has been urinating and her last BM was yesterday.   RN inspected and palpated abdomen. Pt's abdomen is round and tender in RUQ. Skin is intact and no skin lesions are present. Pt is NAD.

## 2021-08-29 NOTE — Assessment & Plan Note (Signed)
SpO2: 99 % O2 Flow Rate (L/min): 2 L/min Pt is stable attribute to her PNA.  Cont rocephin and azithromycin.

## 2021-08-29 NOTE — ED Notes (Addendum)
RN having trouble obtaining blood cultures. Lab called to help. Pt did not feel comfortable with RN sticking pt for blood.

## 2021-08-29 NOTE — ED Provider Notes (Signed)
[facility] Provider Note    Event Date/Time   First MD Initiated Contact with Patient 08/29/21 1712     (approximate)   History   Abdominal Pain   HPI  Brandi [name] is a 71 y.o. female with history of multiple myeloma currently in relapse on chemotherapy presents to the ER for evaluation of right upper quadrant abdominal pain whenever she coughs.  Has had some chills but no measured fevers.  Denies any shortness of breath denies any chest pain.  Has had some constipation related to her chronic medications but denies any diarrhea.  No vomiting.  No pain with deep inspiration.     Physical Exam   Triage Vital Signs: ED Triage Vitals  Enc Vitals Group     BP      Pulse      Resp      Temp      Temp src      SpO2      Weight      Height      Head Circumference      Peak Flow      Pain Score      Pain Loc      Pain Edu?      Excl. in [place]?     Most recent vital signs: Vitals:   08/29/21 1830 08/29/21 2100  BP: 130/77 113/64  Pulse: (!) 52 (!) 52  Resp: 18 19  Temp:    SpO2: 97% 99%     Constitutional: Alert  Eyes: Conjunctivae are normal.  Head: Atraumatic. Nose: No congestion/rhinnorhea. Mouth/Throat: Mucous membranes are moist.   Neck: Painless ROM.  Cardiovascular:   Good peripheral circulation. Respiratory: Normal respiratory effort.  No retractions.  Gastrointestinal: Soft and nontender.  Musculoskeletal:  no deformity Neurologic:  MAE spontaneously. No gross focal neurologic deficits are appreciated.  Skin:  Skin is warm, dry and intact. No rash noted. Psychiatric: Mood and affect are normal. Speech and behavior are normal.    ED Results / Procedures / Treatments   Labs (all labs ordered are listed, but only abnormal results are displayed) Labs Reviewed  COMPREHENSIVE METABOLIC PANEL - Abnormal; Notable for the following components:      Result Value   Glucose, Bld 100 (*)    Creatinine, Ser 1.23 (*)    Calcium  7.8 (*)    Total Protein 8.3 (*)    GFR, Estimated 48 (*)    All other components within normal limits  CBC - Abnormal; Notable for the following components:   RBC 3.57 (*)    Hemoglobin 10.4 (*)    HCT 33.2 (*)    All other components within normal limits  CULTURE, BLOOD (ROUTINE X 2)  CULTURE, BLOOD (ROUTINE X 2)  RESP PANEL BY RT-PCR (FLU A&B, COVID) ARPGX2  LIPASE, BLOOD  LACTIC ACID, PLASMA  URINALYSIS, ROUTINE W REFLEX MICROSCOPIC     EKG      RADIOLOGY Please see ED Course for my review and interpretation.  I personally reviewed all radiographic images ordered to evaluate for the above acute complaints and reviewed radiology reports and findings.  These findings were personally discussed with the patient.  Please see medical record for radiology report.    PROCEDURES:  Critical Care performed: Yes, see critical care procedure note(s)  Procedures   MEDICATIONS ORDERED IN ED: Medications  morphine (PF) 4 MG/ML injection 4 mg (4 mg Intravenous Given 08/29/21 1818)  lactated ringers infusion (has no  administration in time range)  cefTRIAXone (ROCEPHIN) 2 g in sodium chloride 0.9 % 100 mL IVPB (2 g Intravenous New Bag/Given 08/29/21 2051)  azithromycin (ZITHROMAX) 500 mg in sodium chloride 0.9 % 250 mL IVPB (has no administration in time range)  sodium chloride 0.9 % bolus 500 mL (0 mLs Intravenous Stopped 08/29/21 1903)  ondansetron (ZOFRAN) injection 4 mg (4 mg Intravenous Given 08/29/21 1818)  albuterol (PROVENTIL) (2.5 MG/3ML) 0.083% nebulizer solution 2.5 mg (2.5 mg Nebulization Given 08/29/21 2026)     IMPRESSION / MDM / ASSESSMENT AND PLAN / ED COURSE  I reviewed the triage vital signs and the nursing notes.                              Differential diagnosis includes, but is not limited to, pna, bronchitis, sepsis, anemia, electrolyte abn  Patient presenting with abdominal pain and cough.  The patient will be placed on continuous pulse oximetry and telemetry  for monitoring.  Laboratory evaluation will be sent to evaluate for the above complaints.      Clinical Course as of 08/29/21 2125  Sat Aug 29, 2021  1802 Chest x-ray by my review does not show any evidence pneumothorax possible developing infiltrate.  Will await final radiology report. [PR]  1846 Chest x-ray does show findings concerning for bronchopneumonia however patient without any white count right now no hypoxia still awaiting remainder of blood work result.  I have ordered IV abx and nebulizer. [PR]  2049 Patient patient has been observed in the ER for several hours she states that she is feeling worse.  Having very frequent coughing fits feeling frail having trouble walking due to fatigue and weakness.  She was noted to be having O2 saturations dipped into the high 80s during these coughing fits and come back to 90% on room air therefore she was put on 2 L with some improvement.  Given her presentation I do think that hospitalization is appropriate for IV antibiotics and respiratory care for her acute hypoxia. [PR]    Clinical Course User Index [PR] Merlyn Lot, MD     FINAL CLINICAL IMPRESSION(S) / ED DIAGNOSES   Final diagnoses:  Acute respiratory failure with hypoxia ([place])  Bronchopneumonia     Rx / DC Orders   ED Discharge Orders     None        Note:  This document was prepared using Dragon voice recognition software and may include unintentional dictation errors.    Merlyn Lot, MD 08/29/21 2125

## 2021-08-29 NOTE — ED Notes (Signed)
Patient transported to X-ray 

## 2021-08-29 NOTE — Assessment & Plan Note (Signed)
Cont aldactone and metoprolol and asa 81.

## 2021-08-29 NOTE — ED Notes (Signed)
Pt was given a coke and sandwhich tray.

## 2021-08-29 NOTE — Assessment & Plan Note (Signed)
Lab Results  Component Value Date   CREATININE 1.23 (H) 08/29/2021   CREATININE 1.15 (H) 08/20/2021   CREATININE 1.16 (H) 08/13/2021  avoid renal contrast.  Renally dose meds.

## 2021-08-29 NOTE — Assessment & Plan Note (Signed)
Prn antitussives.

## 2021-08-29 NOTE — H&P (Signed)
History and Physical    Patient: Brandi [name] QJJ:[id number] DOB: 11/01/1950 DOA: 08/29/2021 DOS: the patient was seen and examined on 08/29/2021 PCP: [facility]  Patient coming from: Home  Chief Complaint:  Chief Complaint  Patient presents with   Abdominal Pain    HPI: Brandi [name] is a 71 y.o. female with medical history significant of coming with c/o RUQ pain and epigastric pain and cough. Pt is a poor historian.    Review of Systems: Unable to review all systems due to lack of cooperation from patient. Past Medical History:  Diagnosis Date   (HFpEF) heart failure with preserved ejection fraction ([place])    a. 06/2020 Echo: EF 60-65%, no rwma, nl RV size/fxn. Triv MR. Triv TR/AI. Mod elev PASP.   Anxiety    COPD (chronic obstructive pulmonary disease) (HCC)    Hypertension    Hyperthyroidism    Hypokalemia    IgA myeloma (HCC)    Multiple myeloma (HCC)    Pneumonia    Red blood cell antibody positive, compatible PRBC difficult to obtain    S/P autologous bone marrow transplantation [facility])    Past Surgical History:  Procedure Laterality Date   ESOPHAGOGASTRODUODENOSCOPY (EGD) WITH PROPOFOL N/A 03/30/2019   Procedure: ESOPHAGOGASTRODUODENOSCOPY (EGD) WITH PROPOFOL;  Surgeon: Jonathon Bellows, MD;  Location: [facility] ENDOSCOPY;  Service: Gastroenterology;  Laterality: N/A;   IR FLUORO GUIDE PORT INSERTION RIGHT  12/16/2017   IR IMAGING GUIDED PORT INSERTION  06/15/2021   THYROIDECTOMY N/A    Social History:  reports that she quit smoking about 5 months ago. Her smoking use included cigarettes. She smoked an average of .25 packs per day. She has never used smokeless tobacco. She reports that she does not drink alcohol and does not use drugs.  No Known Allergies  Family History  Problem Relation Age of Onset   Pneumonia Mother    Seizures Father     Prior to Admission medications   Medication Sig Start Date End Date Taking? Authorizing Provider   acyclovir (ZOVIRAX) 400 MG tablet Take 1 tablet (400 mg total) by mouth 2 (two) times daily. 04/28/21   [name], MD  albuterol (VENTOLIN HFA) 108 (90 Base) MCG/ACT inhaler Inhale 2 puffs into the lungs every 6 (six) hours as needed for wheezing or shortness of breath. 04/28/21   [name], MD  aspirin EC 81 MG EC tablet Take 1 tablet (81 mg total) by mouth daily. 01/25/20   Loel Dubonnet, NP  benzonatate (TESSALON) 100 MG capsule Take 1 capsule (100 mg total) by mouth 3 (three) times daily as needed for cough. 05/22/21   Borders, Kirt Boys, NP  buPROPion (WELLBUTRIN) 75 MG tablet Take 75 mg by mouth daily. 03/24/21   [provider]  calcium-vitamin D (OSCAL WITH D) 500MG-200UNIT (5MCG) tablet Take 1 tablet by mouth 2 (two) times daily. 05/18/21   Borders, Kirt Boys, NP  chlorpheniramine-HYDROcodone (TUSSIONEX) 10-8 MG/5ML SUER Take 5 mLs by mouth every 12 (twelve) hours as needed for cough. 05/22/21   [name], MD  clotrimazole-betamethasone (LOTRISONE) cream Apply topically 2 (two) times daily. 08/13/21   Borders, Kirt Boys, NP  dexamethasone (DECADRON) 4 MG tablet Start 2 days prior to infusion; Take for 2 days. Do not take on the day of infusion. 08/20/21   [name], MD  feeding supplement, ENSURE ENLIVE, (ENSURE ENLIVE) LIQD Take 237 mLs by mouth 2 (two) times daily between meals. 04/12/17   Gouru,  Illene Silver, MD  furosemide (LASIX) 40 MG tablet Take 1 tablet (40 mg total) by mouth daily. 11/12/19   Loel Dubonnet, NP  guaiFENesin-codeine 100-10 MG/5ML syrup TAKE 5 ML BY MOUTH  THREE TIMES DAILY AS NEEDED FOR COUGH. MAY CAUSE DROWSINESS. DO NOT DRIVE WITH THIS MEDICATION 06/25/21   [name], MD  HYDROcodone-acetaminophen (NORCO) 5-325 MG tablet Take 1 tablet by mouth every 6 (six) hours as needed for moderate pain. 08/13/21   Borders, Kirt Boys, NP  lenalidomide (REVLIMID) 15 MG capsule Take 1 capsule (15 mg total) by mouth daily. Take  1 capsule daily for 3 weeks and 1 week  off 08/20/21   [name], MD  levothyroxine (SYNTHROID) 125 MCG tablet Take 1 tablet (125 mcg total) by mouth daily before breakfast. 07/06/21   Verlon Au, NP  lidocaine-prilocaine (EMLA) cream Apply 1 application topically as needed. Apply to port and cover with saran wrap 1-2 hours prior to port access 07/03/21   [name], MD  metoprolol succinate (TOPROL-XL) 25 MG 24 hr tablet Take 1.5 tablets (37.5 mg total) by mouth daily. TAKE 1 & 1/2 (ONE & ONE-HALF) TABLETS BY MOUTH ONCE DAILY 05/29/21 08/27/21  Furth, Cadence H, PA-C  montelukast (SINGULAIR) 10 MG tablet Take 1 tablet (10 mg total) by mouth at bedtime. 07/03/21   [name], MD  ondansetron (ZOFRAN) 8 MG tablet One pill every 8 hours as needed for nausea/vomitting. Patient not taking: Reported on 06/11/2021 04/28/21   [name], MD  pantoprazole (PROTONIX) 40 MG tablet Take 1 tablet by mouth once daily 08/06/21   [name], MD  polyethylene glycol (MIRALAX / GLYCOLAX) packet Take 17 g by mouth daily as needed for mild constipation. 04/11/17   Gouru, Illene Silver, MD  potassium chloride SA (KLOR-CON) 20 MEQ tablet Take 1 tablet (20 mEq total) by mouth 2 (two) times daily. 04/28/21   [name], MD  spironolactone (ALDACTONE) 25 MG tablet Take 1 tablet by mouth once daily 05/11/21   Marrianne Mood D, PA-C  Vitamin D, Ergocalciferol, (DRISDOL) 1.25 MG (50000 UNIT) CAPS capsule Take 1 capsule by mouth once a week 08/06/21   [name], MD    Physical Exam: Vitals:   08/29/21 1715 08/29/21 1717 08/29/21 1830 08/29/21 2100  BP:  (!) 141/83 130/77 113/64  Pulse:  (!) 52 (!) 52 (!) 52  Resp:  _0 Temp:  98 F (36.7 C)    TempSrc:  Oral    SpO2:  100% 97% 99%  Weight: 81.6 kg     Height: _1  (1.575 m)     In ed pt is alert,awake and coughing on nasal cannula.  Labs show: Glucose 100/ AKI with creatinine of 1.23 and  normal lft's.  Cbc shows hemoglobin of 10.4. Resp panel negative for flu and covid.  Physical Exam Vitals reviewed.  Constitutional:      General: She is not in acute distress. HENT:     Head: Normocephalic and atraumatic.     Right Ear: External ear normal.     Left Ear: External ear normal.     Mouth/Throat:     Mouth: Mucous membranes are moist.  Eyes:     Extraocular Movements: Extraocular movements intact.  Cardiovascular:     Rate and Rhythm: Normal rate and regular rhythm.     Pulses: Normal pulses.     Heart sounds: Normal heart sounds.  Pulmonary:  Effort: Pulmonary effort is normal.     Breath sounds: Normal breath sounds.  Abdominal:     General: Bowel sounds are normal. There is no distension.     Palpations: Abdomen is soft. There is no mass.     Tenderness: There is no abdominal tenderness. There is no guarding.     Hernia: No hernia is present.  Musculoskeletal:     Right lower leg: No edema.     Left lower leg: No edema.  Lymphadenopathy:     Cervical: No cervical adenopathy.  Skin:    General: Skin is warm.  Neurological:     General: No focal deficit present.     Mental Status: She is alert and oriented to person, place, and time.  Psychiatric:        Mood and Affect: Mood normal.        Behavior: Behavior normal.     Assessment and Plan: * Pneumonia- (present on admission) Continue rocephin and zpak.  We will make pt npo and obtain swallow study.    Abdominal pain- (present on admission) Pt c/o abdominal pain and we will obtain ct abd pelvis noncontrast due to AKI.    Acute on chronic respiratory failure with hypoxia ([place])- (present on admission) SpO2: 99 % O2 Flow Rate (L/min): 2 L/min Pt is stable attribute to her PNA.  Cont rocephin and azithromycin.    Chronic heart failure with preserved ejection fraction (HFpEF) ([place])- (present on admission) Cont aldactone and metoprolol and asa 81.   Essential hypertension- (present on  admission) Blood pressure 113/64, pulse (!) 52, temperature 98 F (36.7 C), temperature source Oral, resp. rate 19, height _0  (1.575 m), weight 81.6 kg, SpO2 99 %. home regimen of metoprolol and aldactone.     Cough in adult patient- (present on admission) Prn antitussives.   ARF (acute renal failure) ([place])- (present on admission) Lab Results  Component Value Date   CREATININE 1.23 (H) 08/29/2021   CREATININE 1.15 (H) 08/20/2021   CREATININE 1.16 (H) 08/13/2021  avoid renal contrast.  Renally dose meds.      Advance Care Planning:   Code Status: Full Code full code   Consults: none   Family Communication: Sheyann Kick (Daughter)  [phone] (Home Phone)  Severity of Illness: The appropriate patient status for this patient is INPATIENT. Inpatient status is judged to be reasonable and necessary in order to provide the required intensity of service to ensure the patient's safety. The patient's presenting symptoms, physical exam findings, and initial radiographic and laboratory data in the context of their chronic comorbidities is felt to place them at high risk for further clinical deterioration. Furthermore, it is not anticipated that the patient will be medically stable for discharge from the hospital within 2 midnights of admission.   * I certify that at the point of admission it is my clinical judgment that the patient will require inpatient hospital care spanning beyond 2 midnights from the point of admission due to high intensity of service, high risk for further deterioration and high frequency of surveillance required.*  Author: Para Skeans, MD 08/29/2021 10:25 PM  For on call review [email or web address].

## 2021-08-29 NOTE — Assessment & Plan Note (Signed)
Blood pressure 113/64, pulse (!) 52, temperature 98 F (36.7 C), temperature source Oral, resp. rate 19, height 5\' 2"  (1.575 m), weight 81.6 kg, SpO2 99 %. home regimen of metoprolol and aldactone.

## 2021-08-29 NOTE — Assessment & Plan Note (Signed)
Pt c/o abdominal pain and we will obtain ct abd pelvis noncontrast due to AKI.

## 2021-08-29 NOTE — Consult Note (Signed)
CODE SEPSIS - PHARMACY COMMUNICATION  **Broad Spectrum Antibiotics should be administered within 1 hour of Sepsis diagnosis**  Time Code Sepsis Called/Page Received: 1900  Antibiotics Ordered: Azithromycin and ceftriaxone  Time of 1st antibiotic administration: 2051  Additional action taken by pharmacy: Message sent to RN @ 2039  If necessary, Name of Provider/Nurse Contacted: Bland Span RN   Alexandrea Westergard Rodriguez-Guzman PharmD, BCPS 08/29/2021 7:15 PM

## 2021-08-29 NOTE — Assessment & Plan Note (Signed)
Continue rocephin and zpak.  We will make pt npo and obtain swallow study.

## 2021-08-29 NOTE — ED Triage Notes (Signed)
Pt states she started with RUQ pain yesterday- pt denies n/v/d- pt states pain is a 8/10- pt denies urinary symptoms- pt states this has happened before and it was pneumonia

## 2021-08-30 ENCOUNTER — Encounter: Payer: Self-pay | Admitting: Internal Medicine

## 2021-08-30 LAB — CBC
HCT: 31.5 % — ABNORMAL LOW (ref 36.0–46.0)
Hemoglobin: 10 g/dL — ABNORMAL LOW (ref 12.0–15.0)
MCH: 29.3 pg (ref 26.0–34.0)
MCHC: 31.7 g/dL (ref 30.0–36.0)
MCV: 92.4 fL (ref 80.0–100.0)
Platelets: 152 10*3/uL (ref 150–400)
RBC: 3.41 MIL/uL — ABNORMAL LOW (ref 3.87–5.11)
RDW: 15.2 % (ref 11.5–15.5)
WBC: 6.9 10*3/uL (ref 4.0–10.5)
nRBC: 0 % (ref 0.0–0.2)

## 2021-08-30 LAB — BASIC METABOLIC PANEL
Anion gap: 7 (ref 5–15)
BUN: 11 mg/dL (ref 8–23)
CO2: 24 mmol/L (ref 22–32)
Calcium: 7.4 mg/dL — ABNORMAL LOW (ref 8.9–10.3)
Chloride: 107 mmol/L (ref 98–111)
Creatinine, Ser: 0.99 mg/dL (ref 0.44–1.00)
GFR, Estimated: >60 mL/min (ref >60)
Glucose, Bld: 91 mg/dL (ref 70–99)
Potassium: 3.8 mmol/L (ref 3.5–5.1)
Sodium: 138 mmol/L (ref 135–145)

## 2021-08-30 NOTE — TOC Transition Note (Addendum)
Transition of Care [facility]) - CM/SW Discharge Note   Patient Details  Name: Brandi [name] MRN: [id number] Date of Birth: 02/21/1951  [facility] CM/SW Contact:  Harriet Masson, RN Phone Number: 08/30/2021, 5:16 PM   Clinical Narrative:    Pt with pt today on recommendations for OT. Offered outpatient sources for services however pt opt to decline. Pt has support system in her single family home and able to obtain all her prescribed medications with no delays. Pt has sufficient transportation from her daughter and able to all medical appointments. Declined any need for DME with no PT recommendations.  TOC remains available to assist with any other discharge needs.   Final next level of care: Home/Self Care Barriers to Discharge: Barriers Resolved   Patient Goals and CMS Choice        Discharge Placement                  Name of family member notified: Spoke directly with pt Patient and family notified of of transfer: 08/30/21  Discharge Plan and Services                                     Social Determinants of Health (SDOH) Interventions     Readmission Risk Interventions Readmission Risk Prevention Plan 05/27/2021  Transportation Screening Complete  Medication Review [employer]) Complete  PCP or Specialist appointment within 3-5 days of discharge Complete  HRI or [place] Complete  SW Recovery Care/Counseling Consult Complete  [place] Not Applicable  Some recent data might be hidden

## 2021-08-30 NOTE — Evaluation (Signed)
Occupational Therapy Evaluation Patient Details Name: Brandi [name] MRN: [id number] DOB: [birth year] Today's Date: 08/30/2021   History of Present Illness Brandi [name] is a 71 y.o. female with medical history significant for recent pneumonia in November 2022, recurrent multiple myeloma s/p autologous bone marrow transplant, pneumonia, hypothyroidism/Graves' disease, hypertension, COPD, chronic diastolic CHF, anxiety, who presented to the hospital with right upper quadrant abdominal pain and cough.   Clinical Impression   Ms. Neu presents with generalized weakness, SOB, limited endurance, and impaired balance. She lives in a single-story home, 3 STE, with her son. She has a home health aide 2 hrs/day, 6 days/wk. The aide assists with bathing, LB dressing, cooking, housecleaning. Pt has been IND in bed mobility, ambulation, toileting. Children do grocery shopping and provide transportation, as pt no longer drives. Pt reports frequently feeling fatigued, that she is able to navigate the 3 steps to get into her house, but that she sometimes needs to stop on the way in/out of the house to "catch her breath." She has home O2 which she uses PRN, usually at 4L. During today's evaluation, pt is able to perform bed mobility, transfers, ambulation, grooming, toileting, all without physical assist, but with increased time/effort and SUPV for safety, given pt's reported "dizziness" and "lightheadedness" in standing and slight sway while ambulating, along with frequent use of walls or furniture for support. Recommend ongoing OT while hospitalized, with DC to home with [place], to assist with strength, balance, endurance and to reduce risk of falls, further functional decline, and rehospitalization. Recommend trial of DME to assist with balance and reduce falls risk.      Recommendations for follow up therapy are one component of a multi-disciplinary discharge planning process, led by the attending physician.   Recommendations may be updated based on patient status, additional functional criteria and insurance authorization.   Follow Up Recommendations  Home health OT    Assistance Recommended at Discharge Intermittent Supervision/Assistance  Patient can return home with the following A little help with bathing/dressing/bathroom;Assistance with cooking/housework;Assist for transportation    Functional Status Assessment  Patient has had a recent decline in their functional status and demonstrates the ability to make significant improvements in function in a reasonable and predictable amount of time.  Equipment Recommendations  Other (comment) (DME for ambulation? (defer to PT))    Recommendations for Other Services       Precautions / Restrictions Precautions Precautions: None Precaution Comments: Implanted port, R chest Restrictions Weight Bearing Restrictions: No      Mobility Bed Mobility Overal bed mobility: Modified Independent                  Transfers Overall transfer level: Needs assistance Equipment used: None Transfers: Sit to/from Stand, Bed to chair/wheelchair/BSC Sit to Stand: Min guard     Step pivot transfers: Supervision     General transfer comment: Pt reports dizziness with standing, transfers      Balance Overall balance assessment: Needs assistance Sitting-balance support: Bilateral upper extremity supported Sitting balance-Leahy Scale: Good     Standing balance support: No upper extremity supported, During functional activity Standing balance-Leahy Scale: Fair Standing balance comment: reports dizziness with transfers and ambulation; displays slight sway; uses walls, furniture for support                           ADL either performed or assessed with clinical judgement   ADL Overall ADL's : Needs assistance/impaired Eating/Feeding: Modified  independent   Grooming: Wash/dry hands;Wash/dry face;Independent;Standing                Lower Body Dressing: Moderate assistance Lower Body Dressing Details (indicate cue type and reason): donning socks Toilet Transfer: Modified Independent;Regular Toilet;Stand-pivot Toilet Transfer Details (indicate cue type and reason): increased time Toileting- Clothing Manipulation and Hygiene: Independent         General ADL Comments: increased time, effort for toileing and grooming     Vision Patient Visual Report: No change from baseline       Perception     Praxis      Pertinent Vitals/Pain Pain Assessment Pain Assessment: No/denies pain     Hand Dominance     Extremity/Trunk Assessment Upper Extremity Assessment Upper Extremity Assessment: Overall WFL for tasks assessed   Lower Extremity Assessment Lower Extremity Assessment: Overall WFL for tasks assessed   Cervical / Trunk Assessment Cervical / Trunk Assessment: Normal   Communication Communication Communication: No difficulties   Cognition Arousal/Alertness: Awake/alert Behavior During Therapy: WFL for tasks assessed/performed, Flat affect Overall Cognitive Status: Within Functional Limits for tasks assessed                                 General Comments: slowed speech pattern     General Comments       Exercises Other Exercises Other Exercises: Educ re: falls prevention, possible use of DME, ECS   Shoulder Instructions      Home Living Family/patient expects to be discharged to:: Private residence Living Arrangements: Children Available Help at Discharge: Family;Available PRN/intermittently Type of Home: House Home Access: Stairs to enter [employer] of Steps: 3 Entrance Stairs-Rails: None Home Layout: One level     Bathroom Shower/Tub: Teacher, early years/pre: Standard     Home Equipment: None   Additional Comments: Pt reports that she does not currently use any AD      Prior Functioning/Environment Prior Level of Function : Needs  assist             Mobility Comments: does not use any DME ADLs Comments: requires assists with bathing, LB dressing, cooking and other housework, does not drive        OT Problem List: Decreased activity tolerance;Decreased knowledge of use of DME or AE;Decreased strength;Impaired balance (sitting and/or standing)      OT Treatment/Interventions: Self-care/ADL training;DME and/or AE instruction;Therapeutic activities;Balance training;Energy conservation;Patient/family education    OT Goals(Current goals can be found in the care plan section) Acute Rehab OT Goals Patient Stated Goal: to have more energy OT Goal Formulation: With patient Time For Goal Achievement: 09/13/21 Potential to Achieve Goals: Good ADL Goals Pt Will Perform Lower Body Dressing: sitting/lateral leans;sit to/from stand;with min assist;with adaptive equipment Pt/caregiver will Perform Home Exercise Program: Increased ROM;Increased strength;Independently (to increase strength, flexibility, and endurance) Additional ADL Goal #1: Pt will identify/demonstrate 2+ energy conservation strategies  OT Frequency: Min 2X/week    Co-evaluation              AM-PAC OT "6 Clicks" Daily Activity     Outcome Measure Help from another person eating meals?: None Help from another person taking care of personal grooming?: None Help from another person toileting, which includes using toliet, bedpan, or urinal?: None Help from another person bathing (including washing, rinsing, drying)?: A Little Help from another person to put on and taking off regular upper body clothing?: None Help  from another person to put on and taking off regular lower body clothing?: A Lot 6 Click Score: 21   End of Session    Activity Tolerance: Patient tolerated treatment well Patient left: in bed;with family/visitor present;with call bell/phone within reach  OT Visit Diagnosis: Unsteadiness on feet (R26.81);Dizziness and giddiness (R42)                 Time: 6816-6196 OT Time Calculation (min): 22 min Charges:  OT General Charges $OT Visit: 1 Visit OT Evaluation $OT Eval Moderate Complexity: 1 Mod OT Treatments $Self Care/Home Management : 8-22 mins Josiah Lobo, PhD, MS, OTR/L 08/30/21, 11:01 AM

## 2021-08-30 NOTE — Evaluation (Signed)
Physical Therapy Evaluation Patient Details Name: JENALEE TREVIZO MRN: [id number] DOB: 27-May-1951 Today's Date: 08/30/2021  History of Present Illness  Pt is a 71 y/o F admitted on 08/29/21 with c/c of RUQ abdominal pain, epigastric pain & cough. Pt found to have PNA on admission. CT of abdomen/pelvis was negative for acute intra-abdominal pathology. PMH: HFpEF, anxiety, COPD, HTN, hyperthyroidism, hypokalemia, IgA myeloma, multiple myelome, PNA, red blood cell antibody +, s/p autologous bone marrow transplant  Clinical Impression  Pt received in bed & agreeable to tx. Pt reports she's independent with mobility but has an aide assist her a couple hours each day, 6 days/week & requires assistance for donning/doffing socks & shoes. On this date, pt performs all mobility independently without AD. Pt is able to ambulate 1 lap around nurses station & room<>stairwell & back with seated rest break in between 2/2 fatigue. Pt negotiates 3 steps to simulate home setup with mod I (pt reports she has B rails). Pt maintains SPO2 >90% on room air throughout session. Encouraged pt to sit in recliner vs lying in bed & to ambulate while in hospital. At this time pt does not demonstrate any acute PT needs, PT to sign off at this time.       Recommendations for follow up therapy are one component of a multi-disciplinary discharge planning process, led by the attending physician.  Recommendations may be updated based on patient status, additional functional criteria and insurance authorization.  Follow Up Recommendations No PT follow up    Assistance Recommended at Discharge None  Patient can return home with the following       Equipment Recommendations None recommended by PT  Recommendations for Other Services       Functional Status Assessment Patient has not had a recent decline in their functional status     Precautions / Restrictions Precautions Precautions: None Precaution Comments: Implanted port, R  chest Restrictions Weight Bearing Restrictions: No      Mobility  Bed Mobility Overal bed mobility: Modified Independent                  Transfers Overall transfer level: Independent Equipment used: None Transfers: Sit to/from Stand Sit to Stand: Independent                Ambulation/Gait Ambulation/Gait assistance: Independent Gait Distance (Feet):  (170 + 150 ft) Assistive device: None Gait Pattern/deviations: Decreased step length - left, Decreased step length - right, Decreased stride length Gait velocity: decreased     General Gait Details: slow, steady gait speed  Stairs Stairs: Yes Stairs assistance: Modified independent (Device/Increase time) Stair Management: One rail Right, Step to pattern (R ascending rail, L descending rail) Number of Stairs: 3    Wheelchair Mobility    Modified Rankin (Stroke Patients Only)       Balance Overall balance assessment: Needs assistance Sitting-balance support: No upper extremity supported Sitting balance-Leahy Scale: Normal     Standing balance support: During functional activity, No upper extremity supported Standing balance-Leahy Scale: Good                               Pertinent Vitals/Pain Pain Assessment Pain Assessment: Faces (abdomen when she coughs) Faces Pain Scale: Hurts little more Pain Location: abdomen when she coughs Pain Descriptors / Indicators: Discomfort Pain Intervention(s): Monitored during session    Home Living Family/patient expects to be discharged to:: Private residence Living Arrangements: Children Available  Help at Discharge: Family;Available PRN/intermittently Type of Home: House Home Access: Stairs to enter Entrance Stairs-Rails: Right;Left;Can reach both Entrance Stairs-Number of Steps: 3   Home Layout: One level Home Equipment: None Additional Comments: Pt reports that she does not currently use any AD    Prior Function Prior Level of Function  : Needs assist;Driving       Physical Assist : ADLs (physical)   ADLs (physical): Bathing;Dressing;IADLs Mobility Comments: Pt reports she's independent without AD, driving, no falls in past 6 months. Pt reports she does minimal walking 2/2 hx of "back problems" ADLs Comments: Reports she has an aide that comes a couple of hours 6 days/week to assist with bathing, dressing, cleaning "whatever I need" - pt reports she needs assistance to don/doff socks & shoes & either aide or family assist with this.     Hand Dominance        Extremity/Trunk Assessment   Upper Extremity Assessment Upper Extremity Assessment: Overall WFL for tasks assessed    Lower Extremity Assessment Lower Extremity Assessment: Overall WFL for tasks assessed    Cervical / Trunk Assessment Cervical / Trunk Assessment: Normal  Communication   Communication: No difficulties  Cognition Arousal/Alertness: Awake/alert Behavior During Therapy: WFL for tasks assessed/performed, Flat affect Overall Cognitive Status: Within Functional Limits for tasks assessed                                          General Comments General comments (skin integrity, edema, etc.): SpO2 >90% on room air. Inquired about incentive spirometer & acapella flutter valve from MD for pt. Encouraged pt to ambulate while in hospital & to sit up in recliner.    Exercises     Assessment/Plan    PT Assessment Patient does not need any further PT services  PT Problem List         PT Treatment Interventions      PT Goals (Current goals can be found in the Care Plan section)  Acute Rehab PT Goals Patient Stated Goal: get better PT Goal Formulation: With patient Time For Goal Achievement: 09/13/21 Potential to Achieve Goals: Good    Frequency       Co-evaluation               AM-PAC PT "6 Clicks" Mobility  Outcome Measure Help needed turning from your back to your side while in a flat bed without using  bedrails?: None Help needed moving from lying on your back to sitting on the side of a flat bed without using bedrails?: None Help needed moving to and from a bed to a chair (including a wheelchair)?: None Help needed standing up from a chair using your arms (e.g., wheelchair or bedside chair)?: None Help needed to walk in hospital room?: None Help needed climbing 3-5 steps with a railing? : None 6 Click Score: 24    End of Session   Activity Tolerance: Patient tolerated treatment well Patient left: in chair;with call bell/phone within reach Nurse Communication: Mobility status      Time: 4097-3532 PT Time Calculation (min) (ACUTE ONLY): 23 min   Charges:   PT Evaluation $PT Eval Low Complexity: 1 Low         Lavone Nian, PT, DPT 08/30/21, 1:26 PM   Waunita Schooner 08/30/2021, 1:24 PM

## 2021-08-30 NOTE — Progress Notes (Signed)
Progress Note    Brandi [name]  GXQ:[id number] DOB: [birth year]  DOA: 08/29/2021 [facility]: [facility]      Brief Narrative:    Medical records reviewed and are as summarized below:  Brandi [name] is a 71 y.o. female with medical history significant for recent pneumonia in November 2022, recurrent multiple myeloma s/p autologous bone marrow transplant, pneumonia, hypothyroidism/Graves' disease, hypertension, COPD, chronic diastolic CHF, anxiety, who presented to the hospital with right upper quadrant abdominal pain and cough.     Assessment/Plan:   Principal Problem:   Pneumonia Active Problems:   ARF (acute renal failure) (HCC)   Cough in adult patient   Essential hypertension   Chronic heart failure with preserved ejection fraction (HFpEF) (HCC)   Acute on chronic respiratory failure with hypoxia (HCC)   Hypothyroidism   Abdominal pain    Body mass index is 32.92 kg/m.  (Obesity)  Community-acquired pneumonia in an immunocompromised patient: Continue IV ceftriaxone and azithromycin.  Follow-up blood cultures.  COPD with chronic hypoxic respiratory failure: She uses about 4 L/min oxygen at home as needed but has not required oxygen at home for months.  Continue bronchodilators.  Chronic diastolic CHF, hypertension: Continue Aldactone and metoprolol  Recurrent multiple myeloma stage III s/p autologous stem cell transplant on 06/15/2018.  On Revlimid as an outpatient  Hypothyroidism/Graves' disease s/p thyroidectomy in August 2022  Diet Order             Diet Heart Room service appropriate? Yes; Fluid consistency: Thin  Diet effective now                               Consultants: None  Procedures: None    Medications:    aspirin EC  81 mg Oral Daily   buPROPion  75 mg Oral Daily   heparin  5,000 Units Subcutaneous Q8H   levothyroxine  125 mcg Oral Q0600   metoprolol succinate  37.5 mg Oral Daily    montelukast  10 mg Oral QHS   pantoprazole  40 mg Oral Daily   spironolactone  25 mg Oral Daily   Continuous Infusions:  azithromycin Stopped (08/29/21 2256)   cefTRIAXone (ROCEPHIN)  IV Stopped (08/29/21 2146)     Anti-infectives (From admission, onward)    Start     Dose/Rate Route Frequency Ordered Stop   08/30/21 0700  cefTRIAXone (ROCEPHIN) 2 g in sodium chloride 0.9 % 100 mL IVPB  Status:  Discontinued        2 g 200 mL/hr over 30 Minutes Intravenous Every 24 hours 08/29/21 2221 08/29/21 2226   08/30/21 0700  azithromycin (ZITHROMAX) 500 mg in sodium chloride 0.9 % 250 mL IVPB  Status:  Discontinued        500 mg 250 mL/hr over 60 Minutes Intravenous Every 24 hours 08/29/21 2221 08/29/21 2226   08/29/21 1915  cefTRIAXone (ROCEPHIN) 2 g in sodium chloride 0.9 % 100 mL IVPB        2 g 200 mL/hr over 30 Minutes Intravenous Every 24 hours 08/29/21 1905 09/03/21 1914   08/29/21 1915  azithromycin (ZITHROMAX) 500 mg in sodium chloride 0.9 % 250 mL IVPB        500 mg 250 mL/hr over 60 Minutes Intravenous Every 24 hours 08/29/21 1905 09/03/21 1914              Family Communication/Anticipated D/C date and plan/Code Status  DVT prophylaxis: heparin injection 5,000 Units Start: 08/29/21 2230 SCDs Start: 08/29/21 2219     Code Status: Full Code  Family Communication: Plan discussed with her husband at the bedside Disposition Plan: Discharge to home in 1 to 2 days   Status is: Inpatient Remains inpatient appropriate because: IV antibiotics               Subjective:   Interval events noted.  She complains of cough which makes her breathing without difficulty.  Her husband and speech therapist were at the bedside.  Objective:    Vitals:   08/29/21 2328 08/30/21 0412 08/30/21 0844 08/30/21 0845  BP: 139/83 (!) 113/57 (!) 97/40 106/60  Pulse: (!) 59 (!) 49 (!) 54 (!) 53  Resp: '16 15 15   ' Temp: 97.8 F (36.6 C) 97.7 F (36.5 C) 98.5 F (36.9 C)    TempSrc: Oral Oral    SpO2: 100% 97% 98%   Weight:      Height:       No data found.   Intake/Output Summary (Last 24 hours) at 08/30/2021 1014 Last data filed at 08/29/2021 2146 Gross per 24 hour  Intake 600 ml  Output --  Net 600 ml   Filed Weights   08/29/21 1715  Weight: 81.6 kg    Exam:  GEN: NAD SKIN: No rash EYES: EOMI ENT: MMM CV: RRR PULM: CTA B ABD: soft, ND, NT, +BS CNS: AAO x 3, non focal EXT: No edema or tenderness        Data Reviewed:   I have personally reviewed following labs and imaging studies:  Labs: Labs show the following:   Basic Metabolic Panel: Recent Labs  Lab 08/29/21 1812 08/30/21 0311  NA 137 138  K 3.8 3.8  CL 104 107  CO2 25 24  GLUCOSE 100* 91  BUN 13 11  CREATININE 1.23* 0.99  CALCIUM 7.8* 7.4*   GFR Estimated Creatinine Clearance: 53.1 mL/min (by C-G formula based on SCr of 0.99 mg/dL). Liver Function Tests: Recent Labs  Lab 08/29/21 1812  AST 39  ALT 15  ALKPHOS 73  BILITOT 0.4  PROT 8.3*  ALBUMIN 3.7   Recent Labs  Lab 08/29/21 1812  LIPASE 25   No results for input(s): AMMONIA in the last 168 hours. Coagulation profile No results for input(s): INR, PROTIME in the last 168 hours.  CBC: Recent Labs  Lab 08/29/21 1812 08/30/21 0311  WBC 7.3 6.9  HGB 10.4* 10.0*  HCT 33.2* 31.5*  MCV 93.0 92.4  PLT 182 152   Cardiac Enzymes: No results for input(s): CKTOTAL, CKMB, CKMBINDEX, TROPONINI in the last 168 hours. BNP (last 3 results) No results for input(s): PROBNP in the last 8760 hours. CBG: No results for input(s): GLUCAP in the last 168 hours. D-Dimer: No results for input(s): DDIMER in the last 72 hours. Hgb A1c: No results for input(s): HGBA1C in the last 72 hours. Lipid Profile: No results for input(s): CHOL, HDL, LDLCALC, TRIG, CHOLHDL, LDLDIRECT in the last 72 hours. Thyroid function studies: No results for input(s): TSH, T4TOTAL, T3FREE, THYROIDAB in the last 72 hours.  Invalid  input(s): FREET3 Anemia work up: No results for input(s): VITAMINB12, FOLATE, FERRITIN, TIBC, IRON, RETICCTPCT in the last 72 hours. Sepsis Labs: Recent Labs  Lab 08/29/21 1812 08/29/21 2029 08/30/21 0311  WBC 7.3  --  6.9  LATICACIDVEN  --  1.1  --     Microbiology Recent Results (from the past 240 hour(s))  Resp  Panel by RT-PCR (Flu A&B, Covid) Nasopharyngeal Swab     Status: None   Collection Time: 08/29/21  7:54 PM   Specimen: Nasopharyngeal Swab; Nasopharyngeal(NP) swabs in vial transport medium  Result Value Ref Range Status   SARS Coronavirus 2 by RT PCR NEGATIVE NEGATIVE Final    Comment: (NOTE) SARS-CoV-2 target nucleic acids are NOT DETECTED.  The SARS-CoV-2 RNA is generally detectable in upper respiratory specimens during the acute phase of infection. The lowest concentration of SARS-CoV-2 viral copies this assay can detect is 138 copies/mL. A negative result does not preclude SARS-Cov-2 infection and should not be used as the sole basis for treatment or other patient management decisions. A negative result may occur with  improper specimen collection/handling, submission of specimen other than nasopharyngeal swab, presence of viral mutation(s) within the areas targeted by this assay, and inadequate number of viral copies(<138 copies/mL). A negative result must be combined with clinical observations, patient history, and epidemiological information. The expected result is Negative.  Fact Sheet for Patients:  EntrepreneurPulse.com.au  Fact Sheet for Healthcare Providers:  IncredibleEmployment.be  This test is no t yet approved or cleared by the [place] FDA and  has been authorized for detection and/or diagnosis of SARS-CoV-2 by FDA under an Emergency Use Authorization (EUA). This EUA will remain  in effect (meaning this test can be used) for the duration of the COVID-19 declaration under Section 564(b)(1) of the Act,  21 U.S.C.section 360bbb-3(b)(1), unless the authorization is terminated  or revoked sooner.       Influenza A by PCR NEGATIVE NEGATIVE Final   Influenza B by PCR NEGATIVE NEGATIVE Final    Comment: (NOTE) The Xpert Xpress SARS-CoV-2/FLU/RSV plus assay is intended as an aid in the diagnosis of influenza from Nasopharyngeal swab specimens and should not be used as a sole basis for treatment. Nasal washings and aspirates are unacceptable for Xpert Xpress SARS-CoV-2/FLU/RSV testing.  Fact Sheet for Patients: EntrepreneurPulse.com.au  Fact Sheet for Healthcare Providers: IncredibleEmployment.be  This test is not yet approved or cleared by the [place] FDA and has been authorized for detection and/or diagnosis of SARS-CoV-2 by FDA under an Emergency Use Authorization (EUA). This EUA will remain in effect (meaning this test can be used) for the duration of the COVID-19 declaration under Section 564(b)(1) of the Act, 21 U.S.C. section 360bbb-3(b)(1), unless the authorization is terminated or revoked.  Performed at [facility], [address] [place], [place] [address]   Blood culture (routine x 2)     Status: None (Preliminary result)   Collection Time: 08/29/21  8:29 PM   Specimen: BLOOD  Result Value Ref Range Status   Specimen Description BLOOD BLOOD RIGHT WRIST  Final   Special Requests   Final    BOTTLES DRAWN AEROBIC AND ANAEROBIC Blood Culture adequate volume   Culture   Final    NO GROWTH < 12 HOURS Performed at [facility], [address], [place], [place] [address]    Report Status PENDING  Incomplete  Blood culture (routine x 2)     Status: None (Preliminary result)   Collection Time: 08/29/21  8:29 PM   Specimen: BLOOD  Result Value Ref Range Status   Specimen Description BLOOD BLOOD RIGHT HAND  Final   Special Requests   Final    BOTTLES DRAWN AEROBIC AND ANAEROBIC Blood Culture adequate volume    Culture   Final    NO GROWTH < 12 HOURS Performed at [facility], [address] [place], [place] [address]    Report Status PENDING  Incomplete    Procedures and diagnostic studies:  CT ABDOMEN PELVIS WO CONTRAST  Result Date: 08/30/2021 CLINICAL DATA:  Abdominal pain, acute, nonlocalized. Acute renal failure EXAM: CT ABDOMEN AND PELVIS WITHOUT CONTRAST TECHNIQUE: Multidetector CT imaging of the abdomen and pelvis was performed following the standard protocol without IV contrast. RADIATION DOSE REDUCTION: This exam was performed according to the departmental dose-optimization program which includes automated exposure control, adjustment of the mA and/or kV according to patient size and/or use of iterative reconstruction technique. COMPARISON:  05/24/2021 FINDINGS: Lower chest: Mild bibasilar atelectasis. Mild coronary artery calcification. Global cardiac size within normal limits. Hepatobiliary: No focal liver abnormality is seen. No gallstones, gallbladder wall thickening, or biliary dilatation. Pancreas: Unremarkable Spleen: Unremarkable Adrenals/Urinary Tract: Adrenal glands are unremarkable. Kidneys are normal, without renal calculi, focal lesion, or hydronephrosis. There is mild pelvic floor relaxation present with development of a small cystocele. The bladder is otherwise unremarkable. Stomach/Bowel: Stomach is within normal limits. Appendix appears normal. No evidence of bowel wall thickening, distention, or inflammatory changes. Vascular/Lymphatic: Extensive aortoiliac atherosclerotic calcification. No aortic aneurysm. No pathologic adenopathy within the abdomen and pelvis. Reproductive: Involuted fibroid within the uterus. The pelvic organs are otherwise unremarkable. Other: No abdominal wall hernia.  Rectum unremarkable. Musculoskeletal: Degenerative changes are seen within the lumbar spine. No acute bone abnormality. No lytic or blastic bone lesion identified. IMPRESSION: No acute  intra-abdominal pathology identified. No definite radiographic explanation for the patient's reported symptoms. Mild coronary artery calcification. Mild pelvic floor relaxation with development of a small cystocele. Aortic Atherosclerosis (ICD10-I70.0). Electronically Signed   By: Fidela Salisbury M.D.   On: 08/30/2021 00:13   DG Chest 2 View  Result Date: 08/29/2021 CLINICAL DATA:  Cough.  Question pulmonary infiltrate. EXAM: CHEST - 2 VIEW COMPARISON:  05/24/2021 FINDINGS: Chronic cardiomegaly and aortic atherosclerosis. Power port in place on the right with the tip in the SVC above the right atrium. There are mild patchy perihilar infiltrates consistent with bronchopneumonia. No dense consolidation or lobar collapse. No effusion. Bony structures unremarkable. IMPRESSION: Hazy/patchy perihilar infiltrates consistent with bronchopneumonia. Electronically Signed   By: Nelson Chimes M.D.   On: 08/29/2021 18:27               LOS: 1 day   Mannie [place]  [facility]   Pager on [email or web address]. If 7PM-7AM, please contact night-coverage at [email or web address]     08/30/2021, 10:14 AM

## 2021-08-30 NOTE — Evaluation (Signed)
Clinical/Bedside Swallow Evaluation Patient Details  Name: Brandi [name] MRN: [id number] Date of Birth: [birth year]  Today's Date: 08/30/2021 Time: SLP Start Time (ACUTE ONLY): 0950 SLP Stop Time (ACUTE ONLY): 1005 SLP Time Calculation (min) (ACUTE ONLY): 15 min  Past Medical History:  Past Medical History:  Diagnosis Date   (HFpEF) heart failure with preserved ejection fraction ([place])    a. 06/2020 Echo: EF 60-65%, no rwma, nl RV size/fxn. Triv MR. Triv TR/AI. Mod elev PASP.   Anxiety    COPD (chronic obstructive pulmonary disease) (HCC)    Hypertension    Hyperthyroidism    Hypokalemia    IgA myeloma (HCC)    Multiple myeloma (HCC)    Pneumonia    Red blood cell antibody positive, compatible PRBC difficult to obtain    S/P autologous bone marrow transplantation [facility])    Past Surgical History:  Past Surgical History:  Procedure Laterality Date   ESOPHAGOGASTRODUODENOSCOPY (EGD) WITH PROPOFOL N/A 03/30/2019   Procedure: ESOPHAGOGASTRODUODENOSCOPY (EGD) WITH PROPOFOL;  Surgeon: Jonathon Bellows, MD;  Location: [facility] ENDOSCOPY;  Service: Gastroenterology;  Laterality: N/A;   IR FLUORO GUIDE PORT INSERTION RIGHT  12/16/2017   IR IMAGING GUIDED PORT INSERTION  06/15/2021   THYROIDECTOMY N/A    HPI:  Per 31 H&P "Brandi [name] is a 71 y.o. female with medical history significant of coming with c/o RUQ pain and epigastric pain and cough." CXR on admission "Hazy/patchy perihilar infiltrates consistent with bronchopneumonia."    Assessment / Plan / Recommendation  Clinical Impression  Pt seen for clinical swallowing evaluation. Pt alert, pleasant, and cooperative. Hoarse and hypophonic  vocal quality appreciated which pt states is baseline since thyroidectomy. Pt denies dysphagia.    Pt given trials of thin liquids (via cup and straw) and solid. Pt able to feed self without difficulty. Pt demonstrated an intact oral swallow. No overt or subtle s/sx pharyngeal dysphagia across  trials. To palpation, pt with seemingly timely swallow initiation and seemingly adequate laryngeal elevation.   Recommend continuation of a regular diet with thin liquids and safe swallowing strategies as outlined below.   SLP to sign off as pt has no acute SLP needs at this time.   Pt and RN made aware of results, recommendations, and SLP POC. Pt also made aware of risks of aspiration/aspiration PNA in setting of COPD. Emphasized importance of slow rate of intake and rest breaks PRN given the likely shortened apneic period during the swallow in pt's with COPD. Pt verbalized understanding/agreement.    SLP Visit Diagnosis: Dysphagia, unspecified (R13.10)    Aspiration Risk  Mild aspiration risk    Diet Recommendation Regular;Thin liquid   Medication Administration:  (as tolerated) Supervision: Patient able to self feed Compensations: Slow rate;Small sips/bites (rest breaks PRN) Postural Changes: Seated upright at 90 degrees    Other  Recommendations Recommended Consults: Consider ENT evaluation (for voice) Oral Care Recommendations: Oral care BID    Recommendations for follow up therapy are one component of a multi-disciplinary discharge planning process, led by the attending physician.  Recommendations may be updated based on patient status, additional functional criteria and insurance authorization.  Follow up Recommendations No SLP follow up      Assistance Recommended at Discharge  (defer to OT/PT)  Functional Status Assessment Patient has not had a recent decline in their functional status         Prognosis Prognosis for Safe Diet Advancement: Good      Swallow Study   General Date of  Onset: 08/29/21 HPI: Per Physician's H&P "Brandi [name] is a 72 y.o. female with medical history significant of coming with c/o RUQ pain and epigastric pain and cough." CXR on admission "Hazy/patchy perihilar infiltrates consistent with bronchopneumonia." Type of Study: Bedside Swallow  Evaluation Previous Swallow Assessment: unknown Diet Prior to this Study: Regular;Thin liquids Temperature Spikes Noted:  (not at present) Respiratory Status: Nasal cannula (2L/min) History of Recent Intubation: No Behavior/Cognition: Alert;Cooperative;Pleasant mood Oral Cavity Assessment: Within Functional Limits Oral Care Completed by SLP: No Oral Cavity - Dentition: Dentures, not available (few lower teeth; dentures were "thrown out" PTA) Vision: Functional for self-feeding    Oral/Motor/Sensory Function Overall Oral Motor/Sensory Function: Within functional limits   Thin Liquid Thin Liquid: Within functional limits Presentation: Cup;Spoon;Self Fed Other Comments: ~4 oz    Solid     Solid: Within functional limits Presentation: Self Fed Other Comments: saltine cracker; presented in halves     Brandi [name], M.S., [facility] [phone] (ASCOM)  Brandi [name] 08/30/2021,10:40 AM

## 2021-08-31 MED ORDER — AMOXICILLIN-POT CLAVULANATE 875-125 MG PO TABS: 1.0000 | ORAL_TABLET | Freq: Two times a day (BID) | ORAL | 0 refills | Status: AC

## 2021-08-31 MED ORDER — AZITHROMYCIN 500 MG PO TABS: 500.0000 mg | ORAL_TABLET | Freq: Every evening | ORAL | 0 refills | Status: AC

## 2021-08-31 NOTE — Discharge Summary (Signed)
Physician Discharge Summary  Brandi [name] WYO:[id number] DOB: 24-Mar-1951 DOA: 08/29/2021  PCP: [facility] [place] date: 08/29/2021 Discharge date: 08/31/2021  Discharge disposition: Home   Recommendations for Outpatient Follow-Up:   Follow-up with PCP or oncologist in 1 week   Discharge Diagnosis:   Principal Problem:   Pneumonia Active Problems:   ARF (acute renal failure) ([place])   Cough in adult patient   Essential hypertension   Chronic heart failure with preserved ejection fraction (HFpEF) ([place])   Acute on chronic respiratory failure with hypoxia ([place])   Hypothyroidism   Abdominal pain    Discharge Condition: Stable.  Diet recommendation:  Diet Order             Diet - low sodium heart healthy           Diet Heart Room service appropriate? Yes; Fluid consistency: Thin  Diet effective now                     Code Status: Full Code     Hospital Course:   Ms. Brandi [name] is a 71 y.o. female with medical history significant for recent pneumonia in November 2022, recurrent multiple myeloma s/p autologous bone marrow transplant, pneumonia, hypothyroidism/Graves' disease, hypertension, COPD, chronic diastolic CHF, anxiety, who presented to the hospital with right upper quadrant abdominal pain and cough.  She was admitted to the hospital for community-acquired pneumonia Immunocompromised patient.  She was treated with empiric IV ceftriaxone and azithromycin.  Her condition is improved and she is tolerating room air.  She only uses oxygen as needed at home.  She is deemed stable for discharge to home today.    Discharge Exam:    Vitals:   08/30/21 1950 08/30/21 2000 08/31/21 0517 08/31/21 0700  BP:  111/70 111/61 108/64  Pulse: 67 (!) 53 (!) 51 (!) 55  Resp:  _0 Temp: 98.2 F (36.8 C) 98.1 F (36.7 C) 98.2 F (36.8 C) 98.2 F (36.8 C)  TempSrc: Oral Oral Oral Oral  SpO2: 99% 90% 96% 95%  Weight:    84.1 kg   Height:         GEN: NAD SKIN: No rash EYES: EOMI ENT: MMM CV: RRR PULM: CTA B ABD: soft, ND, NT, +BS CNS: AAO x 3, non focal EXT: No edema or tenderness   The results of significant diagnostics from this hospitalization (including imaging, microbiology, ancillary and laboratory) are listed below for reference.     Procedures and Diagnostic Studies:   CT ABDOMEN PELVIS WO CONTRAST  Result Date: 08/30/2021 CLINICAL DATA:  Abdominal pain, acute, nonlocalized. Acute renal failure EXAM: CT ABDOMEN AND PELVIS WITHOUT CONTRAST TECHNIQUE: Multidetector CT imaging of the abdomen and pelvis was performed following the standard protocol without IV contrast. RADIATION DOSE REDUCTION: This exam was performed according to the departmental dose-optimization program which includes automated exposure control, adjustment of the mA and/or kV according to patient size and/or use of iterative reconstruction technique. COMPARISON:  05/24/2021 FINDINGS: Lower chest: Mild bibasilar atelectasis. Mild coronary artery calcification. Global cardiac size within normal limits. Hepatobiliary: No focal liver abnormality is seen. No gallstones, gallbladder wall thickening, or biliary dilatation. Pancreas: Unremarkable Spleen: Unremarkable Adrenals/Urinary Tract: Adrenal glands are unremarkable. Kidneys are normal, without renal calculi, focal lesion, or hydronephrosis. There is mild pelvic floor relaxation present with development of a small cystocele. The bladder is otherwise unremarkable. Stomach/Bowel: Stomach is within normal limits. Appendix appears normal. No evidence  of bowel wall thickening, distention, or inflammatory changes. Vascular/Lymphatic: Extensive aortoiliac atherosclerotic calcification. No aortic aneurysm. No pathologic adenopathy within the abdomen and pelvis. Reproductive: Involuted fibroid within the uterus. The pelvic organs are otherwise unremarkable. Other: No abdominal wall hernia.  Rectum  unremarkable. Musculoskeletal: Degenerative changes are seen within the lumbar spine. No acute bone abnormality. No lytic or blastic bone lesion identified. IMPRESSION: No acute intra-abdominal pathology identified. No definite radiographic explanation for the patient's reported symptoms. Mild coronary artery calcification. Mild pelvic floor relaxation with development of a small cystocele. Aortic Atherosclerosis (ICD10-I70.0). Electronically Signed   By: Fidela Salisbury M.D.   On: 08/30/2021 00:13   DG Chest 2 View  Result Date: 08/29/2021 CLINICAL DATA:  Cough.  Question pulmonary infiltrate. EXAM: CHEST - 2 VIEW COMPARISON:  05/24/2021 FINDINGS: Chronic cardiomegaly and aortic atherosclerosis. Power port in place on the right with the tip in the SVC above the right atrium. There are mild patchy perihilar infiltrates consistent with bronchopneumonia. No dense consolidation or lobar collapse. No effusion. Bony structures unremarkable. IMPRESSION: Hazy/patchy perihilar infiltrates consistent with bronchopneumonia. Electronically Signed   By: Nelson Chimes M.D.   On: 08/29/2021 18:27     Labs:   Basic Metabolic Panel: Recent Labs  Lab 08/29/21 1812 08/30/21 0311  NA 137 138  K 3.8 3.8  CL 104 107  CO2 25 24  GLUCOSE 100* 91  BUN 13 11  CREATININE 1.23* 0.99  CALCIUM 7.8* 7.4*   GFR Estimated Creatinine Clearance: 53.9 mL/min (by C-G formula based on SCr of 0.99 mg/dL). Liver Function Tests: Recent Labs  Lab 08/29/21 1812  AST 39  ALT 15  ALKPHOS 73  BILITOT 0.4  PROT 8.3*  ALBUMIN 3.7   Recent Labs  Lab 08/29/21 1812  LIPASE 25   No results for input(s): AMMONIA in the last 168 hours. Coagulation profile No results for input(s): INR, PROTIME in the last 168 hours.  CBC: Recent Labs  Lab 08/29/21 1812 08/30/21 0311  WBC 7.3 6.9  HGB 10.4* 10.0*  HCT 33.2* 31.5*  MCV 93.0 92.4  PLT 182 152   Cardiac Enzymes: No results for input(s): CKTOTAL, CKMB, CKMBINDEX,  TROPONINI in the last 168 hours. BNP: Invalid input(s): POCBNP CBG: No results for input(s): GLUCAP in the last 168 hours. D-Dimer No results for input(s): DDIMER in the last 72 hours. Hgb A1c No results for input(s): HGBA1C in the last 72 hours. Lipid Profile No results for input(s): CHOL, HDL, LDLCALC, TRIG, CHOLHDL, LDLDIRECT in the last 72 hours. Thyroid function studies No results for input(s): TSH, T4TOTAL, T3FREE, THYROIDAB in the last 72 hours.  Invalid input(s): FREET3 Anemia work up No results for input(s): VITAMINB12, FOLATE, FERRITIN, TIBC, IRON, RETICCTPCT in the last 72 hours. Microbiology Recent Results (from the past 240 hour(s))  Resp Panel by RT-PCR (Flu A&B, Covid) Nasopharyngeal Swab     Status: None   Collection Time: 08/29/21  7:54 PM   Specimen: Nasopharyngeal Swab; Nasopharyngeal(NP) swabs in vial transport medium  Result Value Ref Range Status   SARS Coronavirus 2 by RT PCR NEGATIVE NEGATIVE Final    Comment: (NOTE) SARS-CoV-2 target nucleic acids are NOT DETECTED.  The SARS-CoV-2 RNA is generally detectable in upper respiratory specimens during the acute phase of infection. The lowest concentration of SARS-CoV-2 viral copies this assay can detect is 138 copies/mL. A negative result does not preclude SARS-Cov-2 infection and should not be used as the sole basis for treatment or other patient management decisions. A  negative result may occur with  improper specimen collection/handling, submission of specimen other than nasopharyngeal swab, presence of viral mutation(s) within the areas targeted by this assay, and inadequate number of viral copies(<138 copies/mL). A negative result must be combined with clinical observations, patient history, and epidemiological information. The expected result is Negative.  Fact Sheet for Patients:  EntrepreneurPulse.com.au  Fact Sheet for Healthcare Providers:   IncredibleEmployment.be  This test is no t yet approved or cleared by the [place] FDA and  has been authorized for detection and/or diagnosis of SARS-CoV-2 by FDA under an Emergency Use Authorization (EUA). This EUA will remain  in effect (meaning this test can be used) for the duration of the COVID-19 declaration under Section 564(b)(1) of the Act, 21 U.S.C.section 360bbb-3(b)(1), unless the authorization is terminated  or revoked sooner.       Influenza A by PCR NEGATIVE NEGATIVE Final   Influenza B by PCR NEGATIVE NEGATIVE Final    Comment: (NOTE) The Xpert Xpress SARS-CoV-2/FLU/RSV plus assay is intended as an aid in the diagnosis of influenza from Nasopharyngeal swab specimens and should not be used as a sole basis for treatment. Nasal washings and aspirates are unacceptable for Xpert Xpress SARS-CoV-2/FLU/RSV testing.  Fact Sheet for Patients: EntrepreneurPulse.com.au  Fact Sheet for Healthcare Providers: IncredibleEmployment.be  This test is not yet approved or cleared by the [place] FDA and has been authorized for detection and/or diagnosis of SARS-CoV-2 by FDA under an Emergency Use Authorization (EUA). This EUA will remain in effect (meaning this test can be used) for the duration of the COVID-19 declaration under Section 564(b)(1) of the Act, 21 U.S.C. section 360bbb-3(b)(1), unless the authorization is terminated or revoked.  Performed at [facility], [address] [place], [place] [address]   Blood culture (routine x 2)     Status: None (Preliminary result)   Collection Time: 08/29/21  8:29 PM   Specimen: BLOOD  Result Value Ref Range Status   Specimen Description BLOOD BLOOD RIGHT WRIST  Final   Special Requests   Final    BOTTLES DRAWN AEROBIC AND ANAEROBIC Blood Culture adequate volume   Culture   Final    NO GROWTH 2 DAYS Performed at [facility], [address] [place], [place] [address]    Report Status PENDING  Incomplete  Blood culture (routine x 2)     Status: None (Preliminary result)   Collection Time: 08/29/21  8:29 PM   Specimen: BLOOD  Result Value Ref Range Status   Specimen Description BLOOD BLOOD RIGHT HAND  Final   Special Requests   Final    BOTTLES DRAWN AEROBIC AND ANAEROBIC Blood Culture adequate volume   Culture   Final    NO GROWTH 2 DAYS Performed at [facility], [address], [place], [place] [address]    Report Status PENDING  Incomplete     Discharge Instructions:   Discharge Instructions     Diet - low sodium heart healthy   Complete by: As directed    Increase activity slowly   Complete by: As directed       Allergies as of 08/31/2021   No Known Allergies      Medication List     STOP taking these medications    benzonatate 100 MG capsule Commonly known as: TESSALON   chlorpheniramine-HYDROcodone 10-8 MG/5ML Suer Commonly known as: TUSSIONEX       TAKE these medications    acyclovir 400 MG tablet Commonly  known as: ZOVIRAX Take 1 tablet (400 mg total) by mouth 2 (two) times daily.   albuterol 108 (90 Base) MCG/ACT inhaler Commonly known as: VENTOLIN HFA Inhale 2 puffs into the lungs every 6 (six) hours as needed for wheezing or shortness of breath.   amoxicillin-clavulanate 875-125 MG tablet Commonly known as: Augmentin Take 1 tablet by mouth 2 (two) times daily for 5 days.   aspirin 81 MG EC tablet Take 1 tablet (81 mg total) by mouth daily.   azithromycin 500 MG tablet Commonly known as: Zithromax Take 1 tablet (500 mg total) by mouth every evening for 2 days. Take 1 tablet daily for 3 days.   buPROPion 75 MG tablet Commonly known as: WELLBUTRIN Take 75 mg by mouth daily.   calcium-vitamin D 500-5 MG-MCG tablet Commonly known as: OSCAL WITH D Take 1 tablet by mouth 2 (two) times daily.   clotrimazole-betamethasone cream Commonly known as: LOTRISONE Apply  topically 2 (two) times daily.   dexamethasone 4 MG tablet Commonly known as: DECADRON Start 2 days prior to infusion; Take for 2 days. Do not take on the day of infusion.   feeding supplement Liqd Take 237 mLs by mouth 2 (two) times daily between meals.   furosemide 40 MG tablet Commonly known as: Lasix Take 1 tablet (40 mg total) by mouth daily.   guaiFENesin-codeine 100-10 MG/5ML syrup TAKE 5 ML BY MOUTH  THREE TIMES DAILY AS NEEDED FOR COUGH. MAY CAUSE DROWSINESS. DO NOT DRIVE WITH THIS MEDICATION   HYDROcodone-acetaminophen 5-325 MG tablet Commonly known as: Norco Take 1 tablet by mouth every 6 (six) hours as needed for moderate pain.   lenalidomide 15 MG capsule Commonly known as: REVLIMID Take 1 capsule (15 mg total) by mouth daily. Take 1 capsule daily for 3 weeks and 1 week  off   levothyroxine 125 MCG tablet Commonly known as: Synthroid Take 1 tablet (125 mcg total) by mouth daily before breakfast.   lidocaine-prilocaine cream Commonly known as: EMLA Apply 1 application topically as needed. Apply to port and cover with saran wrap 1-2 hours prior to port access   metoprolol succinate 25 MG 24 hr tablet Commonly known as: TOPROL-XL Take 1.5 tablets (37.5 mg total) by mouth daily. TAKE 1 & 1/2 (ONE & ONE-HALF) TABLETS BY MOUTH ONCE DAILY   montelukast 10 MG tablet Commonly known as: SINGULAIR Take 1 tablet (10 mg total) by mouth at bedtime.   ondansetron 8 MG tablet Commonly known as: ZOFRAN One pill every 8 hours as needed for nausea/vomitting.   pantoprazole 40 MG tablet Commonly known as: PROTONIX Take 1 tablet by mouth once daily   polyethylene glycol 17 g packet Commonly known as: MIRALAX / GLYCOLAX Take 17 g by mouth daily as needed for mild constipation.   potassium chloride SA 20 MEQ tablet Commonly known as: KLOR-CON M Take 1 tablet (20 mEq total) by mouth 2 (two) times daily.   spironolactone 25 MG tablet Commonly known as: ALDACTONE Take 1  tablet by mouth once daily   Vitamin D (Ergocalciferol) 1.25 MG (50000 UNIT) Caps capsule Commonly known as: DRISDOL Take 1 capsule by mouth once a week           If you experience worsening of your admission symptoms, develop shortness of breath, life threatening emergency, suicidal or homicidal thoughts you must seek medical attention immediately by calling 911 or calling your MD immediately  if symptoms less severe.   You must read complete instructions/literature along with all  the possible adverse reactions/side effects for all the medicines you take and that have been prescribed to you. Take any new medicines after you have completely understood and accept all the possible adverse reactions/side effects.    Please note   You were cared for by a hospitalist during your hospital stay. If you have any questions about your discharge medications or the care you received while you were in the hospital after you are discharged, you can call the unit and asked to speak with the hospitalist on call if the hospitalist that took care of you is not available. Once you are discharged, your primary care physician will handle any further medical issues. Please note that NO REFILLS for any discharge medications will be authorized once you are discharged, as it is imperative that you return to your primary care physician (or establish a relationship with a primary care physician if you do not have one) for your aftercare needs so that they can reassess your need for medications and monitor your lab values.       Time coordinating discharge: Greater than 30 minutes  Signed:  Hammad Finkler  [facility] 08/31/2021, 2:26 PM   Pager on [email or web address]. If 7PM-7AM, please contact night-coverage at [email or web address]

## 2021-08-31 NOTE — TOC Progression Note (Signed)
Transition of Care [facility]) - Progression Note    Patient Details  Name: Brandi [name] MRN: [id number] Date of Birth: 01-21-1951  [facility]) CM/SW [place], RN Phone Number: 08/31/2021, 10:33 AM  Clinical Narrative:   Patient to be discharged today.  Outpatient therapy recommended, patient declined as she has a supportive family.  No need for DME.        Barriers to Discharge: Barriers Resolved  Expected Discharge Plan and Services           Expected Discharge Date: 08/31/21                                     Social Determinants of Health (SDOH) Interventions    Readmission Risk Interventions Readmission Risk Prevention Plan 05/27/2021  Transportation Screening Complete  Medication Review Press photographer) Complete  PCP or Specialist appointment within 3-5 days of discharge Complete  HRI or [place] Complete  SW Recovery Care/Counseling Consult Complete  Palliative Care Screening Complete  [place] Not Applicable  Some recent data might be hidden

## 2021-09-03 LAB — CULTURE, BLOOD (ROUTINE X 2)
Culture: NO GROWTH
Culture: NO GROWTH
Special Requests: ADEQUATE
Special Requests: ADEQUATE

## 2021-09-08 ENCOUNTER — Telehealth: Payer: Self-pay | Admitting: *Deleted

## 2021-09-08 NOTE — Telephone Encounter (Signed)
[facility] needs Dr. Andre Lefort fax number for medication management prior to eye surgery. PCP has deferred to Oncology.

## 2021-09-08 NOTE — Telephone Encounter (Signed)
Left voice mail with [facility] with fax number.

## 2021-09-09 ENCOUNTER — Other Ambulatory Visit: Payer: Self-pay | Admitting: *Deleted

## 2021-09-09 DIAGNOSIS — C9002 Multiple myeloma in relapse: Secondary | ICD-10-CM

## 2021-09-10 ENCOUNTER — Other Ambulatory Visit: Payer: Self-pay | Admitting: Internal Medicine

## 2021-09-10 DIAGNOSIS — J209 Acute bronchitis, unspecified: Secondary | ICD-10-CM

## 2021-09-11 ENCOUNTER — Encounter: Payer: Self-pay | Admitting: Internal Medicine

## 2021-09-11 ENCOUNTER — Other Ambulatory Visit: Payer: Self-pay | Admitting: *Deleted

## 2021-09-11 MED ORDER — LENALIDOMIDE 15 MG PO CAPS: 15.0000 mg | ORAL_CAPSULE | Freq: Every day | ORAL | 1 refills | Status: DC

## 2021-09-11 NOTE — Telephone Encounter (Signed)
Pt's next survey will be 10/06/21.

## 2021-09-13 ENCOUNTER — Emergency Department: Payer: Medicare Other

## 2021-09-13 ENCOUNTER — Emergency Department
Admission: EM | Admit: 2021-09-13 | Discharge: 2021-09-13 | Disposition: A | Discharge status: Home / Self Care | Payer: Medicare Other | Attending: Emergency Medicine | Admitting: Emergency Medicine

## 2021-09-13 ENCOUNTER — Other Ambulatory Visit: Payer: Self-pay

## 2021-09-13 ENCOUNTER — Encounter: Payer: Self-pay | Admitting: Emergency Medicine

## 2021-09-13 DIAGNOSIS — R0602 Shortness of breath: Secondary | ICD-10-CM | POA: Insufficient documentation

## 2021-09-13 DIAGNOSIS — Z20822 Contact with and (suspected) exposure to covid-19: Secondary | ICD-10-CM | POA: Diagnosis not present

## 2021-09-13 DIAGNOSIS — R197 Diarrhea, unspecified: Secondary | ICD-10-CM | POA: Diagnosis not present

## 2021-09-13 DIAGNOSIS — E860 Dehydration: Secondary | ICD-10-CM

## 2021-09-13 DIAGNOSIS — E876 Hypokalemia: Secondary | ICD-10-CM | POA: Insufficient documentation

## 2021-09-13 DIAGNOSIS — I110 Hypertensive heart disease with heart failure: Secondary | ICD-10-CM | POA: Insufficient documentation

## 2021-09-13 DIAGNOSIS — R059 Cough, unspecified: Secondary | ICD-10-CM | POA: Diagnosis not present

## 2021-09-13 DIAGNOSIS — R531 Weakness: Secondary | ICD-10-CM | POA: Diagnosis present

## 2021-09-13 DIAGNOSIS — R630 Anorexia: Secondary | ICD-10-CM | POA: Insufficient documentation

## 2021-09-13 DIAGNOSIS — E039 Hypothyroidism, unspecified: Secondary | ICD-10-CM | POA: Diagnosis not present

## 2021-09-13 DIAGNOSIS — I509 Heart failure, unspecified: Secondary | ICD-10-CM | POA: Diagnosis not present

## 2021-09-13 LAB — BASIC METABOLIC PANEL
Anion gap: 12 (ref 5–15)
BUN: 15 mg/dL (ref 8–23)
CO2: 24 mmol/L (ref 22–32)
Calcium: 7.5 mg/dL — ABNORMAL LOW (ref 8.9–10.3)
Chloride: 97 mmol/L — ABNORMAL LOW (ref 98–111)
Creatinine, Ser: 1.21 mg/dL — ABNORMAL HIGH (ref 0.44–1.00)
GFR, Estimated: 49 mL/min — ABNORMAL LOW (ref >60)
Glucose, Bld: 104 mg/dL — ABNORMAL HIGH (ref 70–99)
Potassium: 2.9 mmol/L — ABNORMAL LOW (ref 3.5–5.1)
Sodium: 133 mmol/L — ABNORMAL LOW (ref 135–145)

## 2021-09-13 LAB — RESP PANEL BY RT-PCR (FLU A&B, COVID) ARPGX2
Influenza A by PCR: NEGATIVE
Influenza B by PCR: NEGATIVE
SARS Coronavirus 2 by RT PCR: NEGATIVE

## 2021-09-13 LAB — CBC
HCT: 34.2 % — ABNORMAL LOW (ref 36.0–46.0)
Hemoglobin: 11 g/dL — ABNORMAL LOW (ref 12.0–15.0)
MCH: 28.9 pg (ref 26.0–34.0)
MCHC: 32.2 g/dL (ref 30.0–36.0)
MCV: 89.8 fL (ref 80.0–100.0)
Platelets: 159 10*3/uL (ref 150–400)
RBC: 3.81 MIL/uL — ABNORMAL LOW (ref 3.87–5.11)
RDW: 16.3 % — ABNORMAL HIGH (ref 11.5–15.5)
WBC: 3.9 10*3/uL — ABNORMAL LOW (ref 4.0–10.5)
nRBC: 0 % (ref 0.0–0.2)

## 2021-09-13 LAB — BRAIN NATRIURETIC PEPTIDE: B Natriuretic Peptide: 48.3 pg/mL (ref 0.0–100.0)

## 2021-09-13 LAB — TROPONIN I (HIGH SENSITIVITY)
Troponin I (High Sensitivity): 18 ng/L — ABNORMAL HIGH (ref <18)
Troponin I (High Sensitivity): 20 ng/L — ABNORMAL HIGH (ref <18)

## 2021-09-13 LAB — PROTIME-INR
INR: 1 (ref 0.8–1.2)
Prothrombin Time: 13.6 seconds (ref 11.4–15.2)

## 2021-09-13 LAB — MAGNESIUM: Magnesium: 1.6 mg/dL — ABNORMAL LOW (ref 1.7–2.4)

## 2021-09-13 LAB — LACTIC ACID, PLASMA
Lactic Acid, Venous: 1.1 mmol/L (ref 0.5–1.9)
Lactic Acid, Venous: 0.9 mmol/L (ref 0.5–1.9)

## 2021-09-13 MED ORDER — ONDANSETRON HCL 4 MG/2ML IJ SOLN
4.0000 mg | Freq: Once | INTRAMUSCULAR | Status: DC
Start: 2021-09-13 — End: 2021-09-13

## 2021-09-13 MED ORDER — POTASSIUM CHLORIDE 10 MEQ/100ML IV SOLN
10.0000 meq | Freq: Once | INTRAVENOUS | Status: AC
  Administered 2021-09-13: 10 meq via INTRAVENOUS

## 2021-09-13 MED ORDER — SODIUM CHLORIDE 0.9 % IV BOLUS: 1000.0000 mL | Freq: Once | INTRAVENOUS | Status: DC

## 2021-09-13 MED ORDER — SODIUM CHLORIDE 0.9 % IV BOLUS
1000.0000 mL | Freq: Once | INTRAVENOUS | Status: AC
  Administered 2021-09-13: 1000 mL via INTRAVENOUS

## 2021-09-13 MED ORDER — HYDROCODONE-ACETAMINOPHEN 5-325 MG PO TABS: 1.0000 | ORAL_TABLET | Freq: Three times a day (TID) | ORAL | 0 refills | Status: AC | PRN

## 2021-09-13 MED ORDER — HYDROCODONE-ACETAMINOPHEN 5-325 MG PO TABS
1.0000 | ORAL_TABLET | Freq: Once | ORAL | Status: AC
  Administered 2021-09-13: 1 via ORAL
  Filled 2021-09-13: qty 1

## 2021-09-13 NOTE — Discharge Instructions (Addendum)
Your exam, labs, and EKG are all essentially normal.  You did have a decreasing potassium which is likely due to your recent episodes of diarrhea.  Your chest x-ray shows resolving pneumonia.  You should continue with your home meds as prescribed, follow with primary provider for ongoing symptoms.  Return to the ED if needed.

## 2021-09-13 NOTE — ED Notes (Signed)
Pt placed a hosp gown and on cardiac monitor. Warm blankets, pillow and tv control provided.

## 2021-09-13 NOTE — ED Notes (Signed)
RN to bedside to introduce self to pt. Pt in hospital gown, watching TV, CAOx4 and in no acute distress.

## 2021-09-13 NOTE — ED Triage Notes (Signed)
Pt to ED via POV with daughter and is c/o SHOB, diarrhea and weakness. Pt was here last week and diagnosed with pneumonia where she was admitted, she is not getting any better. She is coughing up mucous. Is able to speak in complete clear sentences.

## 2021-09-13 NOTE — ED Notes (Signed)
Pt discharge information reviewed. Pt understands need for follow up care and when to return if symptoms worsen. All questions answered. Pt is alert and oriented with even and regular respirations. Pt iis brought out of department in wheelchair by family friend.

## 2021-09-13 NOTE — ED Provider Notes (Signed)
[facility] Emergency Department Provider Note     Event Date/Time   First MD Initiated Contact with Patient 09/13/21 1505     (approximate)   History   Weakness, Shortness of Breath, and Diarrhea   HPI  Brandi [name] is a 71 y.o. female with a history of hypothyroidism, acute renal failure, hypokalemia, hypertension, and CHF presents to the ED for evaluation of shortness of breath, diarrhea, and weakness.  Patient was admitted last week for a pneumonia, and was treated with Rocephin and azithromycin.  According to the daughter's report, the patient is not getting better, continue to have productive cough.  Patient would not endorse weakness, poor appetite, and diarrhea.  She denies any frank chest pain, abdominal pain, or vomiting.     Physical Exam   Triage Vital Signs: ED Triage Vitals  Enc Vitals Group     BP 09/13/21 1343 122/77     Pulse Rate 09/13/21 1343 79     Resp 09/13/21 1343 18     Temp 09/13/21 1343 98.8 F (37.1 C)     Temp Source 09/13/21 1343 Oral     SpO2 09/13/21 1343 93 %     Weight 09/13/21 1344 185 lb 6.5 oz (84.1 kg)     Height 09/13/21 1344 5\' 2"  (1.575 m)     Head Circumference --      Peak Flow --      Pain Score 09/13/21 1344 9     Pain Loc --      Pain Edu? --      Excl. in [place]? --     Most recent vital signs: Vitals:   09/13/21 1830 09/13/21 1900  BP: 109/79 129/69  Pulse: 60 74  Resp: 19 15  Temp:    SpO2: 98% 100%    General Awake, no distress.  HEENT NCAT. PERRL. EOMI. No rhinorrhea. Mucous membranes are moist.  CV:  Good peripheral perfusion. No LE edema RESP:  Normal effort.  ABD:  No distention. Soft, nontender  ED Results / Procedures / Treatments   Labs (all labs ordered are listed, but only abnormal results are displayed) Labs Reviewed  BASIC METABOLIC PANEL - Abnormal; Notable for the following components:      Result Value   Sodium 133 (*)    Potassium 2.9 (*)    Chloride 97 (*)     Glucose, Bld 104 (*)    Creatinine, Ser 1.21 (*)    Calcium 7.5 (*)    GFR, Estimated 49 (*)    All other components within normal limits  CBC - Abnormal; Notable for the following components:   WBC 3.9 (*)    RBC 3.81 (*)    Hemoglobin 11.0 (*)    HCT 34.2 (*)    RDW 16.3 (*)    All other components within normal limits  MAGNESIUM - Abnormal; Notable for the following components:   Magnesium 1.6 (*)    All other components within normal limits  TROPONIN I (HIGH SENSITIVITY) - Abnormal; Notable for the following components:   Troponin I (High Sensitivity) 18 (*)    All other components within normal limits  TROPONIN I (HIGH SENSITIVITY) - Abnormal; Notable for the following components:   Troponin I (High Sensitivity) 20 (*)    All other components within normal limits  RESP PANEL BY RT-PCR (FLU A&B, COVID) ARPGX2  PROTIME-INR  LACTIC ACID, PLASMA  LACTIC ACID, PLASMA  BRAIN NATRIURETIC PEPTIDE  EKG  Vent. rate 80 BPM PR interval 160 ms QRS duration 98 ms QT/QTcB 414/477 ms P-R-T axes 62 47 76 Prolonged QT interval No STEMI  RADIOLOGY  I personally viewed and evaluated these images as part of my medical decision making, as well as reviewing the written report by the radiologist.  ED Provider Interpretation: no acute findings}  DG Chest 2 View  Result Date: 09/13/2021 CLINICAL DATA:  Recently admitted for pneumonia. Persistent shortness of breath and cough. EXAM: CHEST - 2 VIEW COMPARISON:  Chest radiograph 08/29/2021 FINDINGS: Right IJ power injectable port central venous catheter tip projects just above the level of the superior cavoatrial junction. The heart size and mediastinal contours are within normal limits. Aortic calcifications. Previously described bilateral patchy perihilar infiltrates have improved, though there are persistent interstitial opacities. No new focal consolidation, pleural effusion, or pneumothorax. No acute osseous abnormality. IMPRESSION:  Interval improvement of the bilateral patchy perihilar infiltrates seen on 08/29/2021 with residual mild interstitial opacities, consistent with resolving bronchopneumonia. No new focal consolidation. Electronically Signed   By: Ileana Roup M.D.   On: 09/13/2021 14:22     PROCEDURES:  Critical Care performed: No  Procedures   MEDICATIONS ORDERED IN ED: Medications  potassium chloride 10 mEq in 100 mL IVPB (0 mEq Intravenous Stopped 09/13/21 1826)  sodium chloride 0.9 % bolus 1,000 mL (0 mLs Intravenous Stopped 09/13/21 1917)  HYDROcodone-acetaminophen (NORCO/VICODIN) 5-325 MG per tablet 1 tablet (1 tablet Oral Given 09/13/21 1912)     IMPRESSION / MDM / ASSESSMENT AND PLAN / ED COURSE  I reviewed the triage vital signs and the nursing notes.                              Differential diagnosis includes, but is not limited to, dehydration, electrolyte abnormality, CAP, viral URI, CHF  The patient is on the cardiac monitor to evaluate for evidence of arrhythmia and/or significant heart rate changes.  Patient ED evaluation of generalized weakness and concern for dehydration after several days of diarrhea.  Patient a recent admission for pneumonia, but completed outpatient antibiotics as prescribed.  She denies any ongoing cough or congestion.  Patient presents in no acute distress, with a history of CHF, hypokalemia, presents in no acute distress.  Patient was found to have a slightly decreased potassium at 2.9, and is reassuring lactic acid and BNP.  Troponin is slightly elevated at 20, but likely represents some mild heart strain secondary to her dehydration and resolving pneumonia.  Chest x-ray images reviewed by me reports by radiology confirm resolving and improving opacities from previous.  Viral panel screen is negative at this time, and patient otherwise reports improvement of her symptoms after IV fluid bolus and an IV dose of potassium.  Patient's diagnosis is consistent with  hypokalemia and dehydration.  I discussed with the patient and her daughter the option for admission, but the patient verbalized her desire to discharge home after fluid hydration.  Given patient is stable ED course and improved symptoms subjectively, the patient will be discharged home with prescriptions for hydrocodone. Patient is to follow up with her PCP as needed or otherwise directed. Patient is given ED precautions to return to the ED for any worsening or new symptoms.  FINAL CLINICAL IMPRESSION(S) / ED DIAGNOSES   Final diagnoses:  Hypokalemia  Dehydration     Rx / DC Orders   ED Discharge Orders  Ordered    HYDROcodone-acetaminophen (NORCO) 5-325 MG tablet  3 times daily PRN        09/13/21 1929             Note:  This document was prepared using Dragon voice recognition software and may include unintentional dictation errors.    Melvenia Needles, PA-C 09/13/21 1936    Harvest Dark, MD 09/13/21 [phone]

## 2021-09-17 ENCOUNTER — Inpatient Hospital Stay: Payer: Medicare Other | Attending: Nurse Practitioner

## 2021-09-17 ENCOUNTER — Encounter: Payer: Self-pay | Admitting: Internal Medicine

## 2021-09-17 ENCOUNTER — Inpatient Hospital Stay: Payer: Medicare Other

## 2021-09-17 ENCOUNTER — Inpatient Hospital Stay (HOSPITAL_BASED_OUTPATIENT_CLINIC_OR_DEPARTMENT_OTHER): Payer: Medicare Other | Admitting: Internal Medicine

## 2021-09-17 ENCOUNTER — Other Ambulatory Visit: Payer: Self-pay

## 2021-09-17 DIAGNOSIS — Z79899 Other long term (current) drug therapy: Secondary | ICD-10-CM | POA: Diagnosis not present

## 2021-09-17 DIAGNOSIS — C9002 Multiple myeloma in relapse: Secondary | ICD-10-CM | POA: Diagnosis not present

## 2021-09-17 DIAGNOSIS — C9000 Multiple myeloma not having achieved remission: Secondary | ICD-10-CM

## 2021-09-17 DIAGNOSIS — E038 Other specified hypothyroidism: Secondary | ICD-10-CM | POA: Insufficient documentation

## 2021-09-17 LAB — COMPREHENSIVE METABOLIC PANEL
ALT: 15 U/L (ref 0–44)
AST: 33 U/L (ref 15–41)
Albumin: 3.8 g/dL (ref 3.5–5.0)
Alkaline Phosphatase: 67 U/L (ref 38–126)
Anion gap: 9 (ref 5–15)
BUN: 12 mg/dL (ref 8–23)
CO2: 22 mmol/L (ref 22–32)
Calcium: 9 mg/dL (ref 8.9–10.3)
Chloride: 107 mmol/L (ref 98–111)
Creatinine, Ser: 0.99 mg/dL (ref 0.44–1.00)
GFR, Estimated: >60 mL/min (ref >60)
Glucose, Bld: 99 mg/dL (ref 70–99)
Potassium: 3.7 mmol/L (ref 3.5–5.1)
Sodium: 138 mmol/L (ref 135–145)
Total Bilirubin: 0.2 mg/dL — ABNORMAL LOW (ref 0.3–1.2)
Total Protein: 8.6 g/dL — ABNORMAL HIGH (ref 6.5–8.1)

## 2021-09-17 LAB — CBC WITH DIFFERENTIAL/PLATELET
Abs Immature Granulocytes: 0.04 10*3/uL (ref 0.00–0.07)
Basophils Absolute: 0 10*3/uL (ref 0.0–0.1)
Basophils Relative: 0 %
Eosinophils Absolute: 0 10*3/uL (ref 0.0–0.5)
Eosinophils Relative: 0 %
HCT: 31 % — ABNORMAL LOW (ref 36.0–46.0)
Hemoglobin: 9.9 g/dL — ABNORMAL LOW (ref 12.0–15.0)
Immature Granulocytes: 1 %
Lymphocytes Relative: 36 %
Lymphs Abs: 2.9 10*3/uL (ref 0.7–4.0)
MCH: 29.4 pg (ref 26.0–34.0)
MCHC: 31.9 g/dL (ref 30.0–36.0)
MCV: 92 fL (ref 80.0–100.0)
Monocytes Absolute: 0.6 10*3/uL (ref 0.1–1.0)
Monocytes Relative: 8 %
Neutro Abs: 4.5 10*3/uL (ref 1.7–7.7)
Neutrophils Relative %: 55 %
Platelets: 218 10*3/uL (ref 150–400)
RBC: 3.37 MIL/uL — ABNORMAL LOW (ref 3.87–5.11)
RDW: 16.2 % — ABNORMAL HIGH (ref 11.5–15.5)
WBC: 8.1 10*3/uL (ref 4.0–10.5)
nRBC: 0 % (ref 0.0–0.2)

## 2021-09-17 MED ORDER — LEVOTHYROXINE SODIUM 150 MCG PO TABS: 150.0000 ug | ORAL_TABLET | Freq: Every day | ORAL | 4 refills | Status: DC

## 2021-09-17 MED ORDER — MAGNESIUM OXIDE -MG SUPPLEMENT 400 (240 MG) MG PO TABS: 400.0000 mg | ORAL_TABLET | Freq: Two times a day (BID) | ORAL | 3 refills | Status: DC

## 2021-09-17 NOTE — Assessment & Plan Note (Addendum)
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 JAN 2023-M protein-1.1 g/dL.  Kappa/lambda light chain ratio= 221.   # Most recently on salvage therapy with daratumumab [s/p 2 infusions last October 2022]; lenalidomide [15 mg 3 w; 1-w] dexamethasone. However therapy interrupted for recent repeated hospitalization/pneumonia see below.   #Continue REVLIMID 15 mg again. 3 weeks/1 week off; continue revlimid single agent- HOLD dara see below-regarding repeated infections.  Next visit/1 month.   # Iatrogenic hypothyroidism [ Graves/goiter s/p total thyroidectomy;aug,2022 ]- STABLE; DEC TSH-8; increased to 150 mcg/da. Check thyroid profile penidng today.    # Muscle cramps- add Mag Ox BID; if diarrhea- take one a day.  # Repeated episodes of Pneumonia/ immunocompromised [quit smoking- aug 2022]; consider IVIG infusions.  # Chronic diastolic CHF-clinically stable at this time.  # Bone lesions /last zometa on 11/27/2017.  Holding Zometa secondary to poor dentition.  # DISPOSITION:  # HOLD Dara today #  Follow up 4  Weeks-- MD; labs-cbc//cmp;:MM panle; K/l light chain ratio;port flush- Dara IV-Dr.B   addendum:  okay to hold aspirin 1 week before surgery; and restart aspirin 1 week after postsurgery.  Patient will be informed.  Message sent to staff.

## 2021-09-17 NOTE — Progress Notes (Signed)
[facility] OFFICE PROGRESS NOTE  Patient Care Team: Marguerita Merles, MD as PCP - General (Family Medicine) End, Harrell Gave, MD as PCP - Cardiology (Cardiology) [name], [name] [name], MD as Consulting Physician (Hematology and Oncology) Ottie Glazier, MD as Consulting Physician (Pulmonary Disease) Gabriel Carina Betsey Holiday, MD as Consulting Physician (Endocrinology)   Cancer Staging  No matching staging information was found for the patient.   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 No morphologic support for residual myeloma disease. Negative for minimal residual disease by MM-MRD flow cytometry; HOLD REVLIMID [leg swelling]; AUG 2021-PET scan negative for myeloma; continue to hold Revlimid  # MARCH 2021-diastolic congestive heart failure [Dr.End]  # BMBx- OCT 2021- BMBx- 5% plasmacytosis-however this appears to be more polyclonal rather than monoclonal-not explain patient worsening anemia.   # OCT 2022- 18th- Dara- Rev-Dex  #November 2021-hyperthyroidism/goiter-  [D.Bennett/Dr.Solum]-methimazole. S/p Thyroidectomy [Dr.Kim; [facility]-AUG 28469] --------------------------  # 12/12- RIGHT JUGULAR DVT-x 344mn xarelto; finished April 2020; September 2020-EGD/dysphagia; Dr. AnVicente Males# Acute renal failure [Dr.Singh; Proteinuria 1.5gm/day ]; acyclovir/Asprin ------------------------------------------------------------------------------------------------------------   DIAGNOSIS: '[ ]'  MULTIPLE MYELOMA  STAGE: III/HIGH RISK ;GOALS: CONTROL  CURRENT/MOST RECENT THERAPY-maintenance Revlimid- on HOLD    Multiple myeloma not having achieved remission ([facility] 11/21/2017 - 04/28/2018 Chemotherapy   Patient is on Treatment Plan : MYELOMA SALVAGE Cyclophosphamide / Carfilzomib / Dexamethasone (CCd) q28d     05/12/2021 -  Chemotherapy   Patient is on Treatment Plan : MYELOMA RELAPSED REFRACTORY Daratumumab SQ + Lenalidomide + Dexamethasone (DaraRd) q28d      INTERVAL HISTORY: Ambulating independently.  Alone.  Brandi [id number].o.  female pleasant patient above history of relapsed multiple myeloma--currently on daratumumab-Revlimid-dexamethasone [s/p 2 infusions of Dara] is here for follow-up.  In the interim patient was admitted to the hospital for bilateral basilar pneumonia.  Treated with ceftriaxone antibiotics.  Reevaluation chest x-ray showed improved basilar infiltrates.  Patient overall has improved however has lost significant weight.  Appetite is improving.  She is currently awaiting cataract surgery on March 23.  Currently no fever no chills.  In the interim patient also had a Mediport placed.  Patient currently on Revlimid.  Review of Systems  Constitutional:  Positive for malaise/fatigue. Negative for chills, diaphoresis and fever.  HENT:  Negative for nosebleeds and sore throat.   Eyes:  Negative for double vision.  Respiratory:  Negative for hemoptysis.   Cardiovascular:  Negative for chest pain, palpitations and orthopnea.  Gastrointestinal:   Negative for abdominal pain, blood in stool, constipation, heartburn and melena.  Genitourinary:  Negative for dysuria, frequency and urgency.  Musculoskeletal:  Positive for back pain.  Skin: Negative.  Negative for itching and rash.  Neurological:  Negative  for dizziness, tingling, focal weakness, weakness and headaches.  Endo/Heme/Allergies:  Does not bruise/bleed easily.  Psychiatric/Behavioral:  Negative for depression. The patient is not nervous/anxious.    PAST MEDICAL HISTORY :  Past Medical History:  Diagnosis Date   (HFpEF) heart failure with preserved ejection fraction ([place])    a. 06/2020 Echo: EF 60-65%, no rwma, nl RV size/fxn. Triv MR. Triv TR/AI. Mod elev PASP.   Anxiety    COPD (chronic obstructive pulmonary disease) (HCC)    Hypertension    Hyperthyroidism    Hypokalemia    IgA myeloma (HCC)    Multiple myeloma (HCC)    Pneumonia    Red blood cell antibody positive, compatible PRBC difficult to obtain    S/P autologous bone marrow transplantation ([place])     PAST SURGICAL HISTORY :   Past Surgical History:  Procedure Laterality Date   ESOPHAGOGASTRODUODENOSCOPY (EGD) WITH PROPOFOL N/A 03/30/2019   Procedure: ESOPHAGOGASTRODUODENOSCOPY (EGD) WITH PROPOFOL;  Surgeon: Jonathon Bellows, MD;  Location: [facility] ENDOSCOPY;  Service: Gastroenterology;  Laterality: N/A;   IR FLUORO GUIDE PORT INSERTION RIGHT  12/16/2017   IR IMAGING GUIDED PORT INSERTION  06/15/2021   THYROIDECTOMY N/A     FAMILY HISTORY :   Family History  Problem Relation Age of Onset   Pneumonia Mother    Seizures Father     SOCIAL HISTORY:   Social History   Tobacco Use   Smoking status: Former    Packs/day: 0.25    Types: Cigarettes    Quit date: 03/18/2021    Years since quitting: 0.5   Smokeless tobacco: Never   Tobacco comments:    2 cigarettes QOD  Vaping Use   Vaping Use: Never used  Substance Use Topics   Alcohol use: No   Drug use: No    ALLERGIES:  has No Known  Allergies.  MEDICATIONS:  Current Outpatient Medications  Medication Sig Dispense Refill   acyclovir (ZOVIRAX) 400 MG tablet Take 1 tablet (400 mg total) by mouth 2 (two) times daily. 60 tablet 3   albuterol (VENTOLIN HFA) 108 (90 Base) MCG/ACT inhaler Inhale 2 puffs into the lungs every 6 (six) hours as needed for wheezing or shortness of breath. 8 g 3   aspirin EC 81 MG EC tablet Take 1 tablet (81 mg total) by mouth daily. 90 tablet 3   buPROPion (WELLBUTRIN) 75 MG tablet Take 75 mg by mouth daily.     calcium-vitamin D (OSCAL WITH D) 500MG-200UNIT (5MCG) tablet Take 1 tablet by mouth 2 (two) times daily. 60 tablet 0   clotrimazole-betamethasone (LOTRISONE) cream Apply topically 2 (two) times daily. 30 g 0   dexamethasone (DECADRON) 4 MG tablet Start 2 days prior to infusion; Take for 2 days. Do not take on the day of infusion. 60 tablet 3   feeding supplement, ENSURE ENLIVE, (ENSURE ENLIVE) LIQD Take 237 mLs by mouth 2 (two) times daily between meals. 60 Bottle 0   HYDROcodone-acetaminophen (NORCO) 5-325 MG tablet Take 1 tablet by mouth every 6 (six) hours as needed for moderate pain. 30 tablet 0   lenalidomide (REVLIMID) 15 MG capsule Take 1 capsule (15 mg total) by mouth daily. Take 1 capsule daily for 3 weeks and 1 week  off 21 capsule 1   lidocaine-prilocaine (EMLA) cream Apply 1 application topically as needed. Apply to port and cover with saran wrap 1-2 hours prior to port access 30 g 1   magnesium oxide (MAG-OX) 400 (240 Mg) MG tablet Take 1 tablet (  400 mg total) by mouth 2 (two) times daily. 60 tablet 3   metoprolol succinate (TOPROL-XL) 25 MG 24 hr tablet Take 1.5 tablets (37.5 mg total) by mouth daily. TAKE 1 & 1/2 (ONE & ONE-HALF) TABLETS BY MOUTH ONCE DAILY 135 tablet 0   montelukast (SINGULAIR) 10 MG tablet Take 1 tablet (10 mg total) by mouth at bedtime. 30 tablet 0   pantoprazole (PROTONIX) 40 MG tablet Take 1 tablet by mouth once daily 90 tablet 0   polyethylene glycol (MIRALAX  / GLYCOLAX) packet Take 17 g by mouth daily as needed for mild constipation. 14 each 0   potassium chloride SA (KLOR-CON) 20 MEQ tablet Take 1 tablet (20 mEq total) by mouth 2 (two) times daily. 60 tablet 6   spironolactone (ALDACTONE) 25 MG tablet Take 1 tablet by mouth once daily 90 tablet 0   Vitamin D, Ergocalciferol, (DRISDOL) 1.25 MG (50000 UNIT) CAPS capsule Take 1 capsule by mouth once a week 12 capsule 0   furosemide (LASIX) 40 MG tablet Take 1 tablet (40 mg total) by mouth daily. (Patient not taking: Reported on 08/30/2021) 90 tablet 3   guaiFENesin-codeine 100-10 MG/5ML syrup TAKE 5 ML BY MOUTH  THREE TIMES DAILY AS NEEDED FOR COUGH. MAY CAUSE DROWSINESS. DO NOT DRIVE WITH THIS MEDICATION (Patient not taking: Reported on 09/17/2021) 120 mL 0   levothyroxine (SYNTHROID) 150 MCG tablet Take 1 tablet (150 mcg total) by mouth daily before breakfast. 30 tablet 4   ondansetron (ZOFRAN) 8 MG tablet One pill every 8 hours as needed for nausea/vomitting. (Patient not taking: Reported on 06/11/2021) 40 tablet 1   No current facility-administered medications for this visit.   Facility-Administered Medications Ordered in Other Visits  Medication Dose Route Frequency Provider Last Rate Last Admin   0.9 %  sodium chloride infusion   Intravenous Continuous Monia Sabal, PA-C        PHYSICAL EXAMINATION: ECOG PERFORMANCE STATUS: 1 - Symptomatic but completely ambulatory  BP 136/84    Pulse (!) 47    Temp 98.2 F (36.8 C)    Resp 18    Wt 175 lb 12.8 oz (79.7 kg)    SpO2 99%    BMI 32.15 kg/m   Filed Weights   09/17/21 0900  Weight: 175 lb 12.8 oz (79.7 kg)    Physical Exam HENT:     Head: Normocephalic and atraumatic.  Eyes:     Pupils: Pupils are equal, round, and reactive to light.  Cardiovascular:     Rate and Rhythm: Normal rate and regular rhythm.  Pulmonary:     Effort: Pulmonary effort is normal. No respiratory distress.     Breath sounds: Normal breath sounds. No wheezing.   Abdominal:     General: Bowel sounds are normal. There is no distension.     Palpations: Abdomen is soft. There is no mass.     Tenderness: There is no abdominal tenderness. There is no guarding or rebound.  Musculoskeletal:        General: No tenderness. Normal range of motion.     Cervical back: Normal range of motion and neck supple.  Skin:    General: Skin is warm.  Neurological:     Mental Status: She is alert and oriented to person, place, and time.  Psychiatric:        Mood and Affect: Affect normal.   LABORATORY DATA:  I have reviewed the data as listed    Component Value Date/Time   NA  138 09/17/2021 0847   NA 141 01/25/2020 1530   K 3.7 09/17/2021 0847   CL 107 09/17/2021 0847   CO2 22 09/17/2021 0847   GLUCOSE 99 09/17/2021 0847   BUN 12 09/17/2021 0847   BUN 19 01/25/2020 1530   CREATININE 0.99 09/17/2021 0847   CALCIUM 9.0 09/17/2021 0847   PROT 8.6 (H) 09/17/2021 0847   ALBUMIN 3.8 09/17/2021 0847   AST 33 09/17/2021 0847   ALT 15 09/17/2021 0847   ALKPHOS 67 09/17/2021 0847   BILITOT 0.2 (L) 09/17/2021 0847   GFRNONAA >60 09/17/2021 0847   GFRAA NOT CALCULATED 04/17/2020 1352    No results found for: SPEP, UPEP  Lab Results  Component Value Date   WBC 8.1 09/17/2021   NEUTROABS 4.5 09/17/2021   HGB 9.9 (L) 09/17/2021   HCT 31.0 (L) 09/17/2021   MCV 92.0 09/17/2021   PLT 218 09/17/2021      Chemistry      Component Value Date/Time   NA 138 09/17/2021 0847   NA 141 01/25/2020 1530   K 3.7 09/17/2021 0847   CL 107 09/17/2021 0847   CO2 22 09/17/2021 0847   BUN 12 09/17/2021 0847   BUN 19 01/25/2020 1530   CREATININE 0.99 09/17/2021 0847      Component Value Date/Time   CALCIUM 9.0 09/17/2021 0847   ALKPHOS 67 09/17/2021 0847   AST 33 09/17/2021 0847   ALT 15 09/17/2021 0847   BILITOT 0.2 (L) 09/17/2021 0847        Latest Reference Range & Units Most Recent 12/24/20 14:01 04/01/21 14:34 04/28/21 09:17 06/11/21 09:42 07/03/21 10:32  08/20/21 10:06  M Protein SerPl Elph-Mcnc Not Observed g/dL 1.1 (H) (C) 08/20/21 10:06 Not Observed (C) 0.6 (H) (C) 0.7 (H) (C) 0.5 (H) (C) 0.7 (H) (C) 1.1 (H) (C)  (H): Data is abnormally high (C): Corrected   Latest Reference Range & Units Most Recent 08/29/20 12:56 10/24/20 13:16 12/24/20 14:01 04/01/21 14:34 04/28/21 09:17 06/11/21 09:42 07/03/21 10:32 08/20/21 10:05  Kappa free light chain 3.3 - 19.4 mg/L 8.5 08/20/21 10:05 14.7 23.1 (H) 16.8 12.4 11.7 7.0 9.9 8.5  Lambda free light chains 5.7 - 26.3 mg/L 221.3 (H) 08/20/21 10:05 27.7 (H) 36.6 (H) 41.1 (H) 156.5 (H) 191.3 (H) 142.2 (H) 177.6 (H) 221.3 (H)  Kappa, lambda light chain ratio 0.26 - 1.65  0.04 (L) 08/20/21 10:05 0.53 0.63 0.41 0.08 (L) 0.06 (L) 0.05 (L) 0.06 (L) 0.04 (L)  (H): Data is abnormally high (L): Data is abnormally low  RADIOGRAPHIC STUDIES: I have personally reviewed the radiological images as listed and agreed with the findings in the report. No results found.   ASSESSMENT & PLAN:  Multiple myeloma not having achieved remission ([place]) # RECURRENT Multiple myeloma stage III [high-risk cytogenetics-status post KRD-VGPR ; status post autologous stem cell transplant on 06/15/18. JAN 2023-M protein-1.1 g/dL.  Kappa/lambda light chain ratio= 221.   # Most recently on salvage therapy with daratumumab [s/p 2 infusions last October 2022]; lenalidomide [15 mg 3 w; 1-w] dexamethasone. However therapy interrupted for recent repeated hospitalization/pneumonia see below.   #Continue REVLIMID 15 mg again. 3 weeks/1 week off; continue revlimid single agent- HOLD dara see below-regarding repeated infections.  Next visit/1 month.   # Iatrogenic hypothyroidism [ Graves/goiter s/p total thyroidectomy;aug,2022 ]- STABLE; DEC TSH-8; increased to 150 mcg/da. Check thyroid profile penidng today.    # Muscle cramps- add Mag Ox BID; if diarrhea- take one a day.  # Repeated  episodes of Pneumonia/ immunocompromised [quit smoking- aug  2022]; consider IVIG infusions.  # Chronic diastolic CHF-clinically stable at this time.  # Bone lesions /last zometa on 11/27/2017.  Holding Zometa secondary to poor dentition.  # DISPOSITION:  # HOLD Dara today #  Follow up 4  Weeks-- MD; labs-cbc//cmp;:MM panle; K/l light chain ratio;port flush- Dara IV-Dr.B     Orders Placed This Encounter  Procedures   CBC with Differential/Platelet    Standing Status:   Future    Standing Expiration Date:   09/17/2022   Multiple Myeloma Panel (SPEP&IFE w/QIG)    Standing Status:   Future    Standing Expiration Date:   09/17/2022   Kappa/lambda light chains    Standing Status:   Future    Standing Expiration Date:   09/17/2022   Comprehensive metabolic panel    Standing Status:   Future    Standing Expiration Date:   09/17/2022     All questions were answered. The patient knows to call the clinic with any problems, questions or concerns.      [name], MD 09/17/2021 5:10 PM

## 2021-09-17 NOTE — Progress Notes (Signed)
Patient treated for pneumonia requiring hospital admission.  Feeling better now but still feeling weak.  Reports episodes of diarrhea with last episode 2-3 days ago.  When she was coughing due to pneumonia she was having right side low abdominal pain that has now improved.  Still having muscle cramps that is worsening.    Patient is scheduled for cataract removal on 3/23 [facility]) and needing to know if she can hold aspirin.    Patient has a 10 lb wt loss since 2/19 despite having a good appetite.    Heart rate on office check today is 47.

## 2021-09-18 ENCOUNTER — Telehealth: Payer: Self-pay

## 2021-09-18 LAB — THYROID PANEL
Free Thyroxine Index: 1.7 (ref 1.2–4.9)
T3 Uptake Ratio: 20 % — ABNORMAL LOW (ref 24–39)
T4, Total: 8.3 ug/dL (ref 4.5–12.0)

## 2021-09-18 LAB — KAPPA/LAMBDA LIGHT CHAINS
Kappa free light chain: 7.2 mg/L (ref 3.3–19.4)
Kappa, lambda light chain ratio: 0.03 — ABNORMAL LOW (ref 0.26–1.65)
Lambda free light chains: 215.3 mg/L — ABNORMAL HIGH (ref 5.7–26.3)

## 2021-09-18 NOTE — Telephone Encounter (Signed)
No answer and not able to leave message.  Will call again.

## 2021-09-18 NOTE — Telephone Encounter (Signed)
Patient notified

## 2021-09-18 NOTE — Telephone Encounter (Signed)
-----   Message from [name], MD sent at 09/17/2021  5:11 PM EST ----- Regarding: asprin Hi- please inform patient that okay to hold aspirin 1 week before surgery; and restart aspirin 1 week after postsurgery. Thanks GB

## 2021-09-21 LAB — MULTIPLE MYELOMA PANEL, SERUM
Albumin SerPl Elph-Mcnc: 3.9 g/dL (ref 2.9–4.4)
Albumin/Glob SerPl: 1 (ref 0.7–1.7)
Alpha 1: 0.1 g/dL (ref 0.0–0.4)
Alpha2 Glob SerPl Elph-Mcnc: 0.8 g/dL (ref 0.4–1.0)
B-Globulin SerPl Elph-Mcnc: 1.6 g/dL — ABNORMAL HIGH (ref 0.7–1.3)
Gamma Glob SerPl Elph-Mcnc: 1.7 g/dL (ref 0.4–1.8)
Globulin, Total: 4.2 g/dL — ABNORMAL HIGH (ref 2.2–3.9)
IgA: 1258 mg/dL — ABNORMAL HIGH (ref 87–352)
IgG (Immunoglobin G), Serum: 548 mg/dL — ABNORMAL LOW (ref 586–1602)
IgM (Immunoglobulin M), Srm: 14 mg/dL — ABNORMAL LOW (ref 26–217)
M Protein SerPl Elph-Mcnc: 1.2 g/dL — ABNORMAL HIGH
Total Protein ELP: 8.1 g/dL (ref 6.0–8.5)

## 2021-09-25 ENCOUNTER — Telehealth: Payer: Self-pay | Admitting: *Deleted

## 2021-09-25 NOTE — Telephone Encounter (Signed)
Call returned to patient.  The muscle cramping is worsening despite taking the max ox BID as advised at last visit.  The cramping episodes can be in any muscle but particularly on left chest, ribs, and arm area.  Pain scale with the muscle cramping is 10/10.  ?

## 2021-09-25 NOTE — Telephone Encounter (Signed)
Dr. B spoke to patient and advised to stop Revlimid until next office visit.  If this doesn't help alleviate the muscle cramping give the office a call back next week for [facility] evaluation.   ?

## 2021-09-25 NOTE — Telephone Encounter (Signed)
I had a call from answering service during lunch and I tried to contact patient, but she did not answer and her voice mail was full so I could not leave a message. She told service that she is having severe muscle spasms and wanted a return call. ?

## 2021-09-30 ENCOUNTER — Encounter: Payer: Self-pay | Admitting: Ophthalmology

## 2021-10-02 ENCOUNTER — Encounter: Payer: Self-pay | Admitting: Medical

## 2021-10-02 ENCOUNTER — Other Ambulatory Visit: Payer: Self-pay

## 2021-10-02 ENCOUNTER — Telehealth: Payer: Self-pay | Admitting: Medical

## 2021-10-02 ENCOUNTER — Ambulatory Visit (INDEPENDENT_AMBULATORY_CARE_PROVIDER_SITE_OTHER): Payer: Medicare Other | Admitting: Medical

## 2021-10-02 VITALS — BP 130/88 | HR 45 | Ht 62.0 in | Wt 173.5 lb

## 2021-10-02 DIAGNOSIS — I5032 Chronic diastolic (congestive) heart failure: Secondary | ICD-10-CM

## 2021-10-02 DIAGNOSIS — J449 Chronic obstructive pulmonary disease, unspecified: Secondary | ICD-10-CM

## 2021-10-02 DIAGNOSIS — D649 Anemia, unspecified: Secondary | ICD-10-CM

## 2021-10-02 DIAGNOSIS — I10 Essential (primary) hypertension: Secondary | ICD-10-CM | POA: Diagnosis not present

## 2021-10-02 DIAGNOSIS — C9000 Multiple myeloma not having achieved remission: Secondary | ICD-10-CM | POA: Diagnosis not present

## 2021-10-02 DIAGNOSIS — R001 Bradycardia, unspecified: Secondary | ICD-10-CM | POA: Diagnosis not present

## 2021-10-02 MED ORDER — FUROSEMIDE 40 MG PO TABS
40.0000 mg | ORAL_TABLET | Freq: Every day | ORAL | 3 refills | Status: DC
Start: 2021-10-02 — End: 2023-12-05

## 2021-10-02 MED ORDER — METOPROLOL SUCCINATE ER 25 MG PO TB24: 25.0000 mg | ORAL_TABLET | Freq: Every day | ORAL | 3 refills | Status: DC

## 2021-10-02 MED ORDER — METOPROLOL SUCCINATE ER 25 MG PO TB24
25.0000 mg | ORAL_TABLET | Freq: Every day | ORAL | 3 refills | Status: DC
Start: 2021-10-02 — End: 2021-11-27

## 2021-10-02 NOTE — Telephone Encounter (Signed)
Pharmacy calling to clarify instructions on dosage not clear please assist ?

## 2021-10-02 NOTE — Patient Instructions (Signed)
Medication Instructions:  ?Your physician has recommended you make the following change in your medication:  ? ?DECREASE metoprolol succinate (Toprol-XL) to 25 mg daily  ? ?*If you need a refill on your cardiac medications before your next appointment, please call your pharmacy* ? ? ?Lab Work: ?None ordered ? ?If you have labs (blood work) drawn today and your tests are completely normal, you will receive your results only by: ?MyChart Message (if you have MyChart) OR ?A paper copy in the mail ?If you have any lab test that is abnormal or we need to change your treatment, we will call you to review the results. ? ? ?Testing/Procedures: ?None ordered ? ? ?Follow-Up: ?At [facility], you and your health needs are our priority.  As part of our continuing mission to provide you with exceptional heart care, we have created designated Provider Care Teams.  These Care Teams include your primary Cardiologist (physician) and Advanced Practice Providers (APPs -  Physician Assistants and Nurse Practitioners) who all work together to provide you with the care you need, when you need it. ? ?We recommend signing up for the patient portal called "MyChart".  Sign up information is provided on this After Visit Summary.  MyChart is used to connect with patients for Virtual Visits (Telemedicine).  Patients are able to view lab/test results, encounter notes, upcoming appointments, etc.  Non-urgent messages can be sent to your provider as well.   ?To learn more about what you can do with MyChart, go to [email or web address].   ? ?Your next appointment:   ?1-2 month(s) ? ?The format for your next appointment:   ?In Person ? ?Provider:   ?You may see Nelva Bush, MD or one of the following Advanced Practice Providers on your designated Care Team:   ?Murray Hodgkins, NP ?Christell Faith, PA-C ?Cadence Kathlen Mody, PA-C ? ? ?Other Instructions ?N/A ?

## 2021-10-02 NOTE — Progress Notes (Signed)
Cardiology Office Note:    Date:  10/02/2021   ID:  Brandi [name], DOB [birth year], MRN [id number]  PCP:  Marguerita Merles, MD  [facility] Cardiologist:  Nelva Bush, MD  [place] Electrophysiologist:  None   Referring MD: [facility]*   Chief Complaint: 3 month follow-up  History of Present Illness:    Brandi [name] is a 71 y.o. 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   She was seen by Dr. Rogue Bussing 08/2019 and Revlimid was held in the setting of lower extremity edema.  TTE 07/2019 with preserved LVEF and normal diastolic function.  PASP at least moderately elevated.  During previous myeloma treatment, she had IJ DVT treated with Xarelto.  She was seen by Dr. Saunders Revel 09/26/2019 with 2 to 3 months worsening lower extremity edema and dyspnea, consistently NYHA class IIIb heart failure. She has been seen regularly in clinic for escalation of GDMT. She is now s/p thyroid removal with improvement in her sx associated with her goiter and abnormal thyroid.  At her last office visit, it was noted she had self reduced her diuresis to every other day.  Her diuresis was transiently increased for 3 days for volume overload.  She was instructed to return to daily Lasix at after that time.  CHF education was provided.   Last seen 05/15/21 and was taking lasix 3-4 times a month. Weight was up 139>170lbs. She was started on lasix 6m daily, Toprol, potassium and spironolactone.   Patient was seen 07/02/21 and reported stable weights on Lasix 475mdaily. No changes were made.   Today, EKG shows heart rate of 45bpm, which appears chronic, however she is feeling weak and tired. No chest pain. She has some SOB when she bends over and on exertion, and this is unchanged. This is  unchanged. No LLE, orthopnea, pnd.   Past Medical History:  Diagnosis Date   (HFpEF) heart failure with preserved ejection fraction (HCGalax   a. 06/2020 Echo: EF 60-65%, no rwma, nl RV size/fxn. Triv MR. Triv TR/AI. Mod elev PASP.   Anxiety    COPD (chronic obstructive pulmonary disease) (HCC)    GERD (gastroesophageal reflux disease)    Hypertension    Hyperthyroidism    Hypokalemia    IgA myeloma (HCC)    Multiple myeloma (HCC)    Pneumonia    Red blood cell antibody positive, compatible PRBC difficult to obtain    [facility]    Past Surgical History:  Procedure Laterality Date   ESOPHAGOGASTRODUODENOSCOPY (EGD) WITH PROPOFOL N/A 03/30/2019   Procedure: ESOPHAGOGASTRODUODENOSCOPY (EGD) WITH PROPOFOL;  Surgeon: AnJonathon BellowsMD;  [facility];  Service: Gastroenterology;  Laterality: N/A;   IR FLUORO GUIDE PORT INSERTION RIGHT  12/16/2017   IR IMAGING GUIDED PORT INSERTION  06/15/2021   THYROIDECTOMY N/A     Current Medications: Current Meds  Medication Sig   acyclovir (ZOVIRAX) 400 MG tablet Take 1 tablet (400 mg total) by mouth 2 (two) times daily.   albuterol (VENTOLIN HFA) 108 (90 Base) MCG/ACT inhaler Inhale 2 puffs into the lungs every 6 (six) hours as needed for wheezing or shortness of breath.   aspirin EC 81 MG EC tablet Take 1 tablet (81 mg total) by mouth daily.   buPROPion (WELLBUTRIN) 75 MG tablet  Take 75 mg by mouth daily.   calcium-vitamin D (OSCAL WITH D) 500MG-200UNIT (5MCG) tablet Take 1 tablet by mouth 2 (two) times daily.   dexamethasone (DECADRON) 4 MG tablet Start 2 days prior to infusion; Take for 2 days. Do not take on the day of infusion.   feeding supplement, ENSURE ENLIVE, (ENSURE ENLIVE) LIQD Take 237 mLs by mouth 2 (two) times daily between meals.   guaiFENesin-codeine 100-10 MG/5ML syrup TAKE 5 ML BY MOUTH  THREE TIMES DAILY AS NEEDED FOR COUGH. MAY CAUSE DROWSINESS. DO NOT DRIVE WITH THIS MEDICATION    HYDROcodone-acetaminophen (NORCO) 5-325 MG tablet Take 1 tablet by mouth every 6 (six) hours as needed for moderate pain.   lenalidomide (REVLIMID) 15 MG capsule Take 1 capsule (15 mg total) by mouth daily. Take 1 capsule daily for 3 weeks and 1 week  off   levothyroxine (SYNTHROID) 150 MCG tablet Take 1 tablet (150 mcg total) by mouth daily before breakfast.   lidocaine-prilocaine (EMLA) cream Apply 1 application topically as needed. Apply to port and cover with saran wrap 1-2 hours prior to port access   magnesium oxide (MAG-OX) 400 (240 Mg) MG tablet Take 1 tablet (400 mg total) by mouth 2 (two) times daily.   montelukast (SINGULAIR) 10 MG tablet Take 1 tablet (10 mg total) by mouth at bedtime.   ondansetron (ZOFRAN) 8 MG tablet One pill every 8 hours as needed for nausea/vomitting.   pantoprazole (PROTONIX) 40 MG tablet Take 1 tablet by mouth once daily   polyethylene glycol (MIRALAX / GLYCOLAX) packet Take 17 g by mouth daily as needed for mild constipation.   potassium chloride SA (KLOR-CON) 20 MEQ tablet Take 1 tablet (20 mEq total) by mouth 2 (two) times daily.   spironolactone (ALDACTONE) 25 MG tablet Take 1 tablet by mouth once daily   Vitamin D, Ergocalciferol, (DRISDOL) 1.25 MG (50000 UNIT) CAPS capsule Take 1 capsule by mouth once a week   [DISCONTINUED] furosemide (LASIX) 40 MG tablet Take 1 tablet (40 mg total) by mouth daily.     Allergies:   Patient has no known allergies.   Social History   Socioeconomic History   Marital status: Single    Spouse name: Not on file   Number of children: 3   Years of education: Not on file   Highest education level: Not on file  Occupational History   Not on file  Tobacco Use   Smoking status: Former    Packs/day: 0.25    Types: Cigarettes    Quit date: 03/18/2021    Years since quitting: 0.5   Smokeless tobacco: Never   Tobacco comments:    2 cigarettes QOD  Vaping Use   Vaping Use: Never used  Substance and Sexual Activity    Alcohol use: No   Drug use: No   Sexual activity: Not on file  Other Topics Concern   Not on file  Social History Narrative   Lives at home with family(son): daughter 8 blocks away. States "I'm never alone." Independent at baseline   Social Determinants of Radio broadcast assistant Strain: Not on file  Food Insecurity: Not on file  Transportation Needs: Not on file  Physical Activity: Not on file  Stress: Not on file  Social Connections: Not on file     Family History: The patient's family history includes Pneumonia in her mother; Seizures in her father.  ROS:   Please see the history of present illness.  All other systems reviewed and are negative.  EKGs/Labs/Other Studies Reviewed:    The following studies were reviewed today:  Echo 08/20/19 1. Left ventricular ejection fraction, by visual estimation, is 60 to  65%. The left ventricle has normal function. There is no left ventricular  hypertrophy.   2. The left ventricle has no regional wall motion abnormalities.   3. Global right ventricle has normal systolic function.The right  ventricular size is normal. No increase in right ventricular wall  thickness.   4. Left atrial size was normal.   5. Right atrial size was normal.   6. The mitral valve is normal in structure. Trivial mitral valve  regurgitation.   7. The tricuspid valve is normal in structure.   8. The tricuspid valve is normal in structure. Tricuspid valve  regurgitation is trivial.   9. The aortic valve is normal in structure. Aortic valve regurgitation is  trivial.  10. The pulmonic valve was normal in structure. Pulmonic valve  regurgitation is not visualized.  11. Moderately elevated pulmonary artery systolic pressure.   EKG:  EKG is  ordered today.  The ekg ordered today demonstrates sinus bradycardia, 45bpm, no ST/T wave changes  Recent Labs: 08/20/2021: TSH 8.777 09/13/2021: B Natriuretic Peptide 48.3; Magnesium 1.6 09/17/2021: ALT 15; BUN  12; Creatinine, Ser 0.99; Hemoglobin 9.9; Platelets 218; Potassium 3.7; Sodium 138  Recent Lipid Panel No results found for: CHOL, TRIG, HDL, CHOLHDL, VLDL, LDLCALC, LDLDIRECT  Physical Exam:    VS:  BP 130/88 (BP Location: Left Arm, Patient Position: Sitting, Cuff Size: Normal)    Pulse (!) 45    Ht _0  (1.575 m)    Wt 173 lb 8 oz (78.7 kg)    SpO2 94%    BMI 31.73 kg/m     Wt Readings from Last 3 Encounters:  10/02/21 173 lb 8 oz (78.7 kg)  09/17/21 175 lb 12.8 oz (79.7 kg)  09/13/21 185 lb 6.5 oz (84.1 kg)     GEN:  Well nourished, well developed in no acute distress HEENT: Normal NECK: No JVD; No carotid bruits LYMPHATICS: No lymphadenopathy CARDIAC:  bradycardic, RR, no murmurs, rubs, gallops RESPIRATORY:  Clear to auscultation without rales, wheezing or rhonchi  ABDOMEN: Soft, non-tender, non-distended MUSCULOSKELETAL:  No edema; No deformity  SKIN: Warm and dry NEUROLOGIC:  Alert and oriented x 3 PSYCHIATRIC:  Normal affect   ASSESSMENT:    1. Chronic diastolic heart failure ([place])   2. Essential hypertension   3. Multiple myeloma, remission status unspecified ([place])   4. Sinus bradycardia   5. Anemia, unspecified type   6. Chronic obstructive pulmonary disease, unspecified COPD type ([place])    PLAN:    In order of problems listed above:  HFpEF Patient is euvolemic on exam. Potassium in the ED was low, this was supplemented and follow-up K was normal at 3.7. she is on potassium 83mQ and spironolactone. Continue Toprol and lasix.   HTN BP today is good. Continue current medications.   Multiple Myeloma/anemia She follows with oncology. HGB 9.0 most recently.  COPD No acute exacerbation  Sinus bradycardia EKG with SB with a hear rate of 45bpm. /Bradycardia appears chronic but patient complains of weakness. She is on Toprol 37.542mdaily. I will go down to 2586maily. May need to further decrease in the future.   Disposition: Follow up in 1 month(s) with MD/APP      Signed, Rihanna Marseille H FNinfa MeekerA-C  10/02/2021 12:32 PM    [facility]

## 2021-10-02 NOTE — Telephone Encounter (Signed)
Called and spoke with pharmacy to clarify dosage.  ?

## 2021-10-05 NOTE — Discharge Instructions (Signed)

## 2021-10-07 ENCOUNTER — Encounter
Admission: RE | Disposition: A | Discharge status: Home / Self Care | Payer: Self-pay | Source: Ambulatory Visit | Attending: Ophthalmology

## 2021-10-07 ENCOUNTER — Other Ambulatory Visit: Payer: Self-pay

## 2021-10-07 ENCOUNTER — Ambulatory Visit: Payer: Medicare Other | Admitting: Anesthesiology

## 2021-10-07 ENCOUNTER — Encounter: Payer: Self-pay | Admitting: Ophthalmology

## 2021-10-07 ENCOUNTER — Ambulatory Visit
Admission: RE | Admit: 2021-10-07 | Discharge: 2021-10-07 | Disposition: A | Discharge status: Home / Self Care | Payer: Medicare Other | Source: Ambulatory Visit | Attending: Ophthalmology | Admitting: Ophthalmology

## 2021-10-07 DIAGNOSIS — Z9481 Bone marrow transplant status: Secondary | ICD-10-CM | POA: Insufficient documentation

## 2021-10-07 DIAGNOSIS — I5032 Chronic diastolic (congestive) heart failure: Secondary | ICD-10-CM | POA: Diagnosis not present

## 2021-10-07 DIAGNOSIS — Z79899 Other long term (current) drug therapy: Secondary | ICD-10-CM | POA: Diagnosis not present

## 2021-10-07 DIAGNOSIS — Z79890 Hormone replacement therapy (postmenopausal): Secondary | ICD-10-CM | POA: Diagnosis not present

## 2021-10-07 DIAGNOSIS — J449 Chronic obstructive pulmonary disease, unspecified: Secondary | ICD-10-CM | POA: Diagnosis not present

## 2021-10-07 DIAGNOSIS — Z7952 Long term (current) use of systemic steroids: Secondary | ICD-10-CM | POA: Diagnosis not present

## 2021-10-07 DIAGNOSIS — C9000 Multiple myeloma not having achieved remission: Secondary | ICD-10-CM | POA: Diagnosis not present

## 2021-10-07 DIAGNOSIS — Z87891 Personal history of nicotine dependence: Secondary | ICD-10-CM | POA: Insufficient documentation

## 2021-10-07 DIAGNOSIS — I110 Hypertensive heart disease with heart failure: Secondary | ICD-10-CM | POA: Diagnosis not present

## 2021-10-07 DIAGNOSIS — H2589 Other age-related cataract: Secondary | ICD-10-CM | POA: Insufficient documentation

## 2021-10-07 DIAGNOSIS — K219 Gastro-esophageal reflux disease without esophagitis: Secondary | ICD-10-CM | POA: Diagnosis not present

## 2021-10-07 DIAGNOSIS — F419 Anxiety disorder, unspecified: Secondary | ICD-10-CM | POA: Insufficient documentation

## 2021-10-07 HISTORY — DX: Gastro-esophageal reflux disease without esophagitis: K21.9

## 2021-10-07 SURGERY — PHACOEMULSIFICATION, CATARACT, WITH IOL INSERTION
Anesthesia: Monitor Anesthesia Care | Site: Eye | Laterality: Left

## 2021-10-07 MED ORDER — LACTATED RINGERS IV SOLN: INTRAVENOUS | Status: DC

## 2021-10-07 MED ORDER — SIGHTPATH DOSE#1 NA HYALUR & NA CHOND-NA HYALUR IO KIT
PACK | INTRAOCULAR | Status: DC | PRN
  Administered 2021-10-07: 1 via OPHTHALMIC

## 2021-10-07 MED ORDER — SIGHTPATH DOSE#1 BSS IO SOLN
INTRAOCULAR | Status: DC | PRN
  Administered 2021-10-07: 131 mL via OPHTHALMIC

## 2021-10-07 MED ORDER — ACETAMINOPHEN 500 MG PO TABS: 1000.0000 mg | ORAL_TABLET | Freq: Once | ORAL | Status: DC | PRN

## 2021-10-07 MED ORDER — BUPIVACAINE HCL (PF) 0.75 % IJ SOLN
INTRAMUSCULAR | Status: DC | PRN
  Administered 2021-10-07: 1.5 mL

## 2021-10-07 MED ORDER — ALFENTANIL 500 MCG/ML IJ INJ
INJECTION | INTRAVENOUS | Status: DC | PRN
  Administered 2021-10-07 (×4): 250 ug via INTRAVENOUS

## 2021-10-07 MED ORDER — ACETYLCHOLINE CHLORIDE 20 MG IO SOLR
INTRAOCULAR | Status: DC | PRN
  Administered 2021-10-07: 20 mg via INTRAOCULAR

## 2021-10-07 MED ORDER — TRYPAN BLUE 0.06 % IO SOSY
PREFILLED_SYRINGE | INTRAOCULAR | Status: DC | PRN
  Administered 2021-10-07: 0.5 mL via INTRAOCULAR

## 2021-10-07 MED ORDER — TETRACAINE HCL 0.5 % OP SOLN
1.0000 [drp] | OPHTHALMIC | Status: AC | PRN
  Administered 2021-10-07 (×3): 1 [drp] via OPHTHALMIC

## 2021-10-07 MED ORDER — ONDANSETRON HCL 4 MG/2ML IJ SOLN: 4.0000 mg | Freq: Once | INTRAMUSCULAR | Status: DC | PRN

## 2021-10-07 MED ORDER — MIDAZOLAM HCL 2 MG/2ML IJ SOLN
INTRAMUSCULAR | Status: DC | PRN
  Administered 2021-10-07: 2 mg via INTRAVENOUS

## 2021-10-07 MED ORDER — ARMC OPHTHALMIC DILATING DROPS
1.0000 "application " | OPHTHALMIC | Status: AC | PRN
  Administered 2021-10-07 (×3): 1 via OPHTHALMIC

## 2021-10-07 MED ORDER — LIDOCAINE HCL (PF) 2 % IJ SOLN
INTRAMUSCULAR | Status: DC | PRN
  Administered 2021-10-07: 1 mL via INTRAMUSCULAR

## 2021-10-07 MED ORDER — FENTANYL CITRATE (PF) 100 MCG/2ML IJ SOLN
INTRAMUSCULAR | Status: DC | PRN
  Administered 2021-10-07: 25 ug via INTRAVENOUS

## 2021-10-07 MED ORDER — SIGHTPATH DOSE#1 BSS IO SOLN
INTRAOCULAR | Status: DC | PRN
  Administered 2021-10-07 (×2): 15 mL

## 2021-10-07 MED ORDER — ACETAMINOPHEN 160 MG/5ML PO SOLN: 975.0000 mg | Freq: Once | ORAL | Status: DC | PRN

## 2021-10-07 MED ORDER — LIDOCAINE HCL (PF) 2 % IJ SOLN
INTRAMUSCULAR | Status: DC | PRN
  Administered 2021-10-07: 1.5 mL via INTRADERMAL

## 2021-10-07 MED ORDER — MOXIFLOXACIN HCL 0.5 % OP SOLN
OPHTHALMIC | Status: DC | PRN
  Administered 2021-10-07: 0.2 mL via OPHTHALMIC

## 2021-10-07 MED ORDER — BRIMONIDINE TARTRATE-TIMOLOL 0.2-0.5 % OP SOLN
OPHTHALMIC | Status: DC | PRN
  Administered 2021-10-07: 1 [drp] via OPHTHALMIC

## 2021-10-07 SURGICAL SUPPLY — 17 items
CANNULA ANT/CHMB 27G (MISCELLANEOUS) IMPLANT
CANNULA ANT/CHMB 27GA (MISCELLANEOUS) ×2 IMPLANT
CATARACT SUITE SIGHTPATH (MISCELLANEOUS) ×2 IMPLANT
FEE CATARACT SUITE SIGHTPATH (MISCELLANEOUS) ×1 IMPLANT
GLOVE SRG 8 PF TXTR STRL LF DI (GLOVE) ×1 IMPLANT
GLOVE SURG ENC TEXT LTX SZ7.5 (GLOVE) ×2 IMPLANT
GLOVE SURG UNDER POLY LF SZ8 (GLOVE) ×2
LENS IOL TECNIS EYHANCE 19.0 (Intraocular Lens) ×1 IMPLANT
NDL FILTER BLUNT 18X1 1/2 (NEEDLE) ×1 IMPLANT
NDL RETROBULBAR .5 NSTRL (NEEDLE) ×1 IMPLANT
NEEDLE FILTER BLUNT 18X 1/2SAF (NEEDLE) ×1
NEEDLE FILTER BLUNT 18X1 1/2 (NEEDLE) ×1 IMPLANT
PACK VITRECTOMY ANTERIOR (MISCELLANEOUS) ×1 IMPLANT
SYR 10ML LL (SYRINGE) ×1 IMPLANT
SYR 3ML LL SCALE MARK (SYRINGE) ×2 IMPLANT
TIP IRRIGATON/ASPIRATION (MISCELLANEOUS) ×1 IMPLANT
WATER STERILE IRR 250ML POUR (IV SOLUTION) ×2 IMPLANT

## 2021-10-07 NOTE — Anesthesia Preprocedure Evaluation (Signed)
Anesthesia Evaluation  ?Patient identified by MRN, date of birth, ID band ?Patient awake ? ? ? ?Reviewed: ?Allergy & Precautions, H&P , NPO status , Patient's Chart, lab work & pertinent test results, reviewed documented beta blocker date and time  ? ?Airway ?Mallampati: II ? ?TM Distance: >3 FB ?Neck ROM: full ? ? ? Dental ?no notable dental hx. ? ?  ?Pulmonary ?neg pulmonary ROS, COPD, former smoker,  ?  ?Pulmonary exam normal ?breath sounds clear to auscultation ? ? ? ? ? ? Cardiovascular ?Exercise Tolerance: Good ?hypertension, negative cardio ROS ? ? ?Rhythm:regular Rate:Normal ? ?06/2020 Echo: EF 60-65%, no rwma, nl RV size/fxn. Triv MR. Triv TR/AI. Mod elev PASP. ?  ?Neuro/Psych ?Anxiety negative neurological ROS ? negative psych ROS  ? GI/Hepatic ?negative GI ROS, Neg liver ROS, GERD  ,  ?Endo/Other  ?negative endocrine ROSHypothyroidism  ? Renal/GU ?negative Renal ROS  ?negative genitourinary ?  ?Musculoskeletal ? ? Abdominal ?  ?Peds ? Hematology ?negative hematology ROS ?(+)   ?Anesthesia Other Findings ?Multiple myeloma ? Reproductive/Obstetrics ?negative OB ROS ? ?  ? ? ? ? ? ? ? ? ? ? ? ? ? ?  ?  ? ? ? ? ? ? ? ? ?Anesthesia Physical ?Anesthesia Plan ? ?ASA: 3 ? ?Anesthesia Plan: MAC  ? ?Post-op Pain Management:   ? ?Induction:  ? ?PONV Risk Score and Plan: 2 and Treatment may vary due to age or medical condition, TIVA and Midazolam ? ?Airway Management Planned:  ? ?Additional Equipment:  ? ?Intra-op Plan:  ? ?Post-operative Plan:  ? ?Informed Consent: I have reviewed the patients History and Physical, chart, labs and discussed the procedure including the risks, benefits and alternatives for the proposed anesthesia with the patient or authorized representative who has indicated his/her understanding and acceptance.  ? ? ? ?Dental Advisory Given ? ?Plan Discussed with: CRNA ? ?Anesthesia Plan Comments:   ? ? ? ? ? ? ?Anesthesia Quick Evaluation ? ?

## 2021-10-07 NOTE — Transfer of Care (Signed)
Immediate Anesthesia Transfer of Care Note ? ?Patient: Brandi [name] ? ?Procedure(s) Performed: CATARACT EXTRACTION PHACO AND INTRAOCULAR LENS PLACEMENT (IOC) LEFT VISION BLUE (Left: Eye) ?ANTERIOR VITRECTOMY (Left: Eye) ? ?Patient Location: PACU ? ?Anesthesia Type: MAC ? ?Level of Consciousness: awake, alert  and patient cooperative ? ?Airway and Oxygen Therapy: Patient Spontanous Breathing and Patient connected to supplemental oxygen ? ?Post-op Assessment: Post-op Vital signs reviewed, Patient's Cardiovascular Status Stable, Respiratory Function Stable, Patent Airway and No signs of Nausea or vomiting ? ?Post-op Vital Signs: Reviewed and stable ? ?Complications: No notable events documented. ? ?

## 2021-10-07 NOTE — H&P (Signed)
[place]  ? ?Primary Care Physician:  Marguerita Merles, MD ?Ophthalmologist: Dr. Merleen Nicely ? ?Pre-Procedure History & Physical: ?HPI:  Brandi [name] is a 71 y.o. female here for ophthalmic surgery. ?  ?Past Medical History:  ?Diagnosis Date  ? (HFpEF) heart failure with preserved ejection fraction ([place])   ? a. 06/2020 Echo: EF 60-65%, no rwma, nl RV size/fxn. Triv MR. Triv TR/AI. Mod elev PASP.  ? Anxiety   ? COPD (chronic obstructive pulmonary disease) ([place])   ? GERD (gastroesophageal reflux disease)   ? Hypertension   ? Hyperthyroidism   ? Hypokalemia   ? IgA myeloma ([place])   ? Multiple myeloma ([place])   ? Pneumonia   ? Red blood cell antibody positive, compatible PRBC difficult to obtain   ? S/P autologous bone marrow transplantation ([employer])   ? ? ?Past Surgical History:  ?Procedure Laterality Date  ? ESOPHAGOGASTRODUODENOSCOPY (EGD) WITH PROPOFOL N/A 03/30/2019  ? Procedure: ESOPHAGOGASTRODUODENOSCOPY (EGD) WITH PROPOFOL;  Surgeon: Jonathon Bellows, MD;  [facility];  Service: Gastroenterology;  Laterality: N/A;  ? IR FLUORO GUIDE PORT INSERTION RIGHT  12/16/2017  ? IR IMAGING GUIDED PORT INSERTION  06/15/2021  ? THYROIDECTOMY N/A   ? ? ?Prior to Admission medications   ?Medication Sig Start Date End Date Taking? Authorizing Provider  ?acyclovir (ZOVIRAX) 400 MG tablet Take 1 tablet (400 mg total) by mouth 2 (two) times daily. 04/28/21  Yes [name], MD  ?albuterol (VENTOLIN HFA) 108 (90 Base) MCG/ACT inhaler Inhale 2 puffs into the lungs every 6 (six) hours as needed for wheezing or shortness of breath. 04/28/21  Yes [name], MD  ?aspirin EC 81 MG EC tablet Take 1 tablet (81 mg total) by mouth daily. 01/25/20  Yes Loel Dubonnet, NP  ?calcium-vitamin D (OSCAL WITH D) $Remov'500MG'pWwMKi$ -200UNIT (5MCG) tablet Take 1 tablet by mouth 2 (two) times daily. 05/18/21  Yes Borders, Kirt Boys, NP  ?clotrimazole-betamethasone (LOTRISONE) cream Apply topically 2 (two) times daily. 08/13/21   Yes Borders, Kirt Boys, NP  ?dexamethasone (DECADRON) 4 MG tablet Start 2 days prior to infusion; Take for 2 days. Do not take on the day of infusion. 08/20/21  Yes [name], MD  ?feeding supplement, ENSURE ENLIVE, (ENSURE ENLIVE) LIQD Take 237 mLs by mouth 2 (two) times daily between meals. 04/12/17  Yes Gouru, Illene Silver, MD  ?furosemide (LASIX) 40 MG tablet Take 1 tablet (40 mg total) by mouth daily. 10/02/21  Yes Furth, Cadence H, PA-C  ?guaiFENesin-codeine 100-10 MG/5ML syrup TAKE 5 ML BY MOUTH  THREE TIMES DAILY AS NEEDED FOR COUGH. MAY CAUSE DROWSINESS. DO NOT DRIVE WITH THIS MEDICATION 09/11/21  Yes [name], MD  ?HYDROcodone-acetaminophen (NORCO) 5-325 MG tablet Take 1 tablet by mouth every 6 (six) hours as needed for moderate pain. 08/13/21  Yes Borders, Kirt Boys, NP  ?lenalidomide (REVLIMID) 15 MG capsule Take 1 capsule (15 mg total) by mouth daily. Take 1 capsule daily for 3 weeks and 1 week  off 09/11/21  Yes [name], MD  ?levothyroxine (SYNTHROID) 150 MCG tablet Take 1 tablet (150 mcg total) by mouth daily before breakfast. 09/17/21  Yes [name], MD  ?lidocaine-prilocaine (EMLA) cream Apply 1 application topically as needed. Apply to port and cover with saran wrap 1-2 hours prior to port access 07/03/21  Yes [name], MD  ?magnesium oxide (MAG-OX) 400 (240 Mg) MG tablet Take 1 tablet (400 mg total) by mouth 2 (two) times daily.  09/17/21  Yes [name], MD  ?metoprolol succinate (TOPROL-XL) 25 MG 24 hr tablet Take 1 tablet (25 mg total) by mouth daily. 10/02/21  Yes Furth, Cadence H, PA-C  ?montelukast (SINGULAIR) 10 MG tablet Take 1 tablet (10 mg total) by mouth at bedtime. 07/03/21  Yes [name], MD  ?ondansetron (ZOFRAN) 8 MG tablet One pill every 8 hours as needed for nausea/vomitting. 04/28/21  Yes [name], MD  ?pantoprazole (PROTONIX) 40 MG tablet Take 1 tablet by mouth once daily 08/06/21  Yes [name], MD  ?polyethylene glycol (MIRALAX / GLYCOLAX) packet Take 17 g by mouth daily as needed for mild constipation. 04/11/17  Yes Gouru, Illene Silver, MD  ?potassium chloride SA (KLOR-CON) 20 MEQ tablet Take 1 tablet (20 mEq total) by mouth 2 (two) times daily. 04/28/21  Yes [name], MD  ?spironolactone (ALDACTONE) 25 MG tablet Take 1 tablet by mouth once daily 05/11/21  Yes Marrianne Mood D, PA-C  ?Vitamin D, Ergocalciferol, (DRISDOL) 1.25 MG (50000 UNIT) CAPS capsule Take 1 capsule by mouth once a week 08/06/21  Yes [name], MD  ?buPROPion (WELLBUTRIN) 75 MG tablet Take 75 mg by mouth daily. 03/24/21   [provider]  ? ? ?Allergies as of 09/01/2021  ? (No Known Allergies)  ? ? ?Family History  ?Problem Relation Age of Onset  ? Pneumonia Mother   ? Seizures Father   ? ? ?Social History  ? ?Socioeconomic History  ? Marital status: Single  ?  Spouse name: Not on file  ? Number of children: 3  ? Years of education: Not on file  ? Highest education level: Not on file  ?Occupational History  ? Not on file  ?Tobacco Use  ? Smoking status: Former  ?  Packs/day: 0.25  ?  Types: Cigarettes  ?  Quit date: 03/18/2021  ?  Years since quitting: 0.5  ? Smokeless tobacco: Never  ? Tobacco comments:  ?  2 cigarettes QOD  ?Vaping Use  ? Vaping Use: Never used  ?Substance and Sexual Activity  ? Alcohol use: No  ? Drug use: No  ? Sexual activity: Not on file  ?Other Topics Concern  ? Not on file  ?Social History Narrative  ? Lives at home with family(son): daughter 8 blocks away. States "I'm never alone." Independent at baseline  ? ?Social Determinants of Health  ? ?Financial Resource Strain: Not on file  ?Food Insecurity: Not on file  ?Transportation Needs: Not on file  ?Physical Activity: Not on file  ?Stress: Not on file  ?Social Connections: Not on file  ?Intimate Partner Violence: Not on file  ? ? ?Review of Systems: ?See HPI, otherwise negative ROS ? ?Physical Exam: ?BP 128/77   Pulse 67    Temp 97.7 ?F (36.5 ?C) (Temporal)   Resp 20   Ht _0  (1.6 m)   Wt 78.9 kg   SpO2 96%   BMI 31.83 kg/m?  ?General:   Alert,  pleasant and cooperative in NAD ?Head:  Normocephalic and atraumatic. ?Lungs:  Clear to auscultation.    ?Heart:  Regular rate and rhythm. ? ?Impression/Plan: ?Brandi [name] is here for ophthalmic surgery. ? ?Risks, benefits, limitations, and alternatives regarding ophthalmic surgery have been reviewed with the patient.  Questions have been answered.  All parties agreeable. ? ? Leandrew Koyanagi, MD  10/07/2021, 2:01 PM  ? ?

## 2021-10-07 NOTE — Op Note (Addendum)
OPERATIVE NOTE ? ?DARTHA ROZZELL ?[id number] ?10/07/2021 ? ? ?PREOPERATIVE DIAGNOSIS:  Mature (Total) Cataract Left Eye H25.89 ?  ?POSTOPERATIVE DIAGNOSIS: same ?       ?  ?PROCEDURE:  Phacoemusification with posterior chamber intraocular lens placement of the left eye .  Vision Blue dye was used to stain the lens capsule. ? ?LENS:   ?Implant Name Type Inv. Item Serial No. Manufacturer Lot No. LRB No. Used Action  ?LENS IOL TECNIS EYHANCE 19.0 - [id number] Intraocular Lens LENS IOL TECNIS EYHANCE 19.0 [id number] SIGHT[employer]  Left 1 Implanted  ?   ?  ?ULTRASOUND TIME:   1 minute 21 seconds, CDE 14.56 ? ?SURGEON:  Merleen Nicely MD assisted by Wyonia Hough, MD ?  ?ANESTHESIA:  Retrobulbar block of Xylocaine and Bupivacaine, 3 ccs ? ? ? ? ?  ?COMPLICATIONS:  None. ?  ?DESCRIPTION OF PROCEDURE:  The patient was identified in the holding room and transported to the operating suite and placed in the supine position.  The left eye was identified as the operative eye, and a retrobulbar block was administered under inravenous sedation.  It was then prepped and draped in the usual sterile ophthalmic fashion.   ? ? ? ?A 1 millimeter clear-corneal paracentesis was made at the inferotemporal position.  0.5 ml of preservative-free 1% lidocaine was injected into the anterior chamber. VisionBlue was injected intracamerally as there was no red reflex to visualize the capsulorhexis. ?The anterior chamber was filled with viscoat viscoelastic.  A 2.4 millimeter keratome was used to make a near-clear corneal incision at the 10:30 position. ? ?The anterior chamber was filled with Provisc underneath the Viscoat in a soft-shell technique. A curvilinear capsulorrhexis was made with a cystotome and capsulorrhexis forceps.  Balanced salt solution was used to hydrodissect the nucleus from the capsule.  Viscoat was then placed in the anterior chamber. ?  ?Phacoemulsification was then used in stop and chop fashion to remove the lens  nucleus and epinucleus.  The remaining cortex was then removed using the irrigation and aspiration handpiece. Provisc was then placed into the capsular bag to distend it for lens placement.  A 19.00-diopter lens was then injected into the capsular bag.  The remaining viscoelastic was aspirated. A strand of vitreous was noted coming through the zonules, opposite the main wound. Anterior vitrectomy was used to decapitate the vitreous strand. No posterior capsular rupture was identified. The lens implant was centered. ? ? ?Wounds were hydrated with balanced salt solution.  The anterior chamber was inflated to a physiologic pressure with balanced salt solution. Miochol was injected intracamerally to constrict the pupil. Vigamox 0.2 ml of a '1mg'$  per ml solution was injected into the anterior chamber for a dose of 0.2 mg of intracameral antibiotic at the completion of the case.  No wound leaks were noted.  Topical Timolol and Brimonidine drops were applied to the eye.  The patient was taken to the recovery room in stable condition without complications of anesthesia or surgery. ? ? ?I was present and supervised the entire procedure. ?Eleana Tocco ?10/07/2021, 3:08 PM ? ? ?

## 2021-10-07 NOTE — Anesthesia Postprocedure Evaluation (Signed)
Anesthesia Post Note ? ?Patient: ILINE BUCHINGER ? ?Procedure(s) Performed: CATARACT EXTRACTION PHACO AND INTRAOCULAR LENS PLACEMENT (IOC) LEFT VISION BLUE (Left: Eye) ?ANTERIOR VITRECTOMY (Left: Eye) ? ? ?  ?Patient location during evaluation: PACU ?Anesthesia Type: MAC ?Level of consciousness: awake and alert ?Pain management: pain level controlled ?Vital Signs Assessment: post-procedure vital signs reviewed and stable ?Respiratory status: spontaneous breathing, nonlabored ventilation and respiratory function stable ?Cardiovascular status: stable and blood pressure returned to baseline ?Postop Assessment: no apparent nausea or vomiting ?Anesthetic complications: no ? ? ?No notable events documented. ? ?April Manson ? ? ? ? ? ?

## 2021-10-08 ENCOUNTER — Encounter: Payer: Self-pay | Admitting: Ophthalmology

## 2021-10-09 NOTE — Addendum Note (Signed)
Addended by: Britt Bottom on: 10/09/2021 10:12 AM ? ? Modules accepted: Orders ? ?

## 2021-10-15 ENCOUNTER — Inpatient Hospital Stay: Payer: Medicare Other

## 2021-10-15 ENCOUNTER — Inpatient Hospital Stay (HOSPITAL_BASED_OUTPATIENT_CLINIC_OR_DEPARTMENT_OTHER): Payer: Medicare Other | Admitting: Nurse Practitioner

## 2021-10-15 ENCOUNTER — Other Ambulatory Visit: Payer: Self-pay | Admitting: Pharmacist

## 2021-10-15 ENCOUNTER — Other Ambulatory Visit: Payer: Self-pay

## 2021-10-15 ENCOUNTER — Inpatient Hospital Stay: Payer: Medicare Other | Attending: Nurse Practitioner

## 2021-10-15 ENCOUNTER — Encounter: Payer: Self-pay | Admitting: Nurse Practitioner

## 2021-10-15 VITALS — BP 114/65 | HR 56 | Temp 98.1°F | Resp 16

## 2021-10-15 VITALS — BP 109/81 | HR 52 | Temp 98.5°F | Ht 62.0 in | Wt 177.0 lb

## 2021-10-15 DIAGNOSIS — C9002 Multiple myeloma in relapse: Secondary | ICD-10-CM

## 2021-10-15 DIAGNOSIS — Z79899 Other long term (current) drug therapy: Secondary | ICD-10-CM | POA: Diagnosis not present

## 2021-10-15 DIAGNOSIS — C9000 Multiple myeloma not having achieved remission: Secondary | ICD-10-CM

## 2021-10-15 DIAGNOSIS — Z5112 Encounter for antineoplastic immunotherapy: Secondary | ICD-10-CM | POA: Diagnosis present

## 2021-10-15 DIAGNOSIS — J209 Acute bronchitis, unspecified: Secondary | ICD-10-CM

## 2021-10-15 LAB — CBC WITH DIFFERENTIAL/PLATELET
Abs Immature Granulocytes: 0.02 10*3/uL (ref 0.00–0.07)
Basophils Absolute: 0 10*3/uL (ref 0.0–0.1)
Basophils Relative: 1 %
Eosinophils Absolute: 0.2 10*3/uL (ref 0.0–0.5)
Eosinophils Relative: 3 %
HCT: 31.9 % — ABNORMAL LOW (ref 36.0–46.0)
Hemoglobin: 10.3 g/dL — ABNORMAL LOW (ref 12.0–15.0)
Immature Granulocytes: 0 %
Lymphocytes Relative: 55 %
Lymphs Abs: 4.3 10*3/uL — ABNORMAL HIGH (ref 0.7–4.0)
MCH: 30 pg (ref 26.0–34.0)
MCHC: 32.3 g/dL (ref 30.0–36.0)
MCV: 93 fL (ref 80.0–100.0)
Monocytes Absolute: 0.4 10*3/uL (ref 0.1–1.0)
Monocytes Relative: 5 %
Neutro Abs: 2.8 10*3/uL (ref 1.7–7.7)
Neutrophils Relative %: 36 %
Platelets: 225 10*3/uL (ref 150–400)
RBC: 3.43 MIL/uL — ABNORMAL LOW (ref 3.87–5.11)
RDW: 16.5 % — ABNORMAL HIGH (ref 11.5–15.5)
WBC: 7.7 10*3/uL (ref 4.0–10.5)
nRBC: 0 % (ref 0.0–0.2)

## 2021-10-15 LAB — COMPREHENSIVE METABOLIC PANEL
ALT: 11 U/L (ref 0–44)
AST: 25 U/L (ref 15–41)
Albumin: 4 g/dL (ref 3.5–5.0)
Alkaline Phosphatase: 90 U/L (ref 38–126)
Anion gap: 11 (ref 5–15)
BUN: 18 mg/dL (ref 8–23)
CO2: 22 mmol/L (ref 22–32)
Calcium: 9.2 mg/dL (ref 8.9–10.3)
Chloride: 105 mmol/L (ref 98–111)
Creatinine, Ser: 1.24 mg/dL — ABNORMAL HIGH (ref 0.44–1.00)
GFR, Estimated: 47 mL/min — ABNORMAL LOW (ref >60)
Glucose, Bld: 110 mg/dL — ABNORMAL HIGH (ref 70–99)
Potassium: 4.2 mmol/L (ref 3.5–5.1)
Sodium: 138 mmol/L (ref 135–145)
Total Bilirubin: 0.3 mg/dL (ref 0.3–1.2)
Total Protein: 8.8 g/dL — ABNORMAL HIGH (ref 6.5–8.1)

## 2021-10-15 LAB — MAGNESIUM: Magnesium: 2.1 mg/dL (ref 1.7–2.4)

## 2021-10-15 MED ORDER — DEXAMETHASONE 4 MG PO TABS
20.0000 mg | ORAL_TABLET | Freq: Once | ORAL | Status: AC
  Administered 2021-10-15: 20 mg via ORAL
  Filled 2021-10-15: qty 5

## 2021-10-15 MED ORDER — MONTELUKAST SODIUM 10 MG PO TABS
10.0000 mg | ORAL_TABLET | Freq: Once | ORAL | Status: AC
  Administered 2021-10-15: 10 mg via ORAL
  Filled 2021-10-15: qty 1

## 2021-10-15 MED ORDER — GUAIFENESIN-CODEINE 100-10 MG/5ML PO SOLN: 5.0000 mL | Freq: Two times a day (BID) | ORAL | 0 refills | Status: DC | PRN

## 2021-10-15 MED ORDER — SODIUM CHLORIDE 0.9% FLUSH
10.0000 mL | Freq: Once | INTRAVENOUS | Status: AC
  Administered 2021-10-15: 10 mL via INTRAVENOUS
  Filled 2021-10-15: qty 10

## 2021-10-15 MED ORDER — LENALIDOMIDE 15 MG PO CAPS: 15.0000 mg | ORAL_CAPSULE | Freq: Every day | ORAL | 0 refills | Status: DC

## 2021-10-15 MED ORDER — DARATUMUMAB-HYALURONIDASE-FIHJ 1800-30000 MG-UT/15ML ~~LOC~~ SOLN
1800.0000 mg | Freq: Once | SUBCUTANEOUS | Status: AC
  Administered 2021-10-15: 1800 mg via SUBCUTANEOUS
  Filled 2021-10-15: qty 15

## 2021-10-15 MED ORDER — ACETAMINOPHEN 325 MG PO TABS
650.0000 mg | ORAL_TABLET | Freq: Once | ORAL | Status: AC
  Administered 2021-10-15: 650 mg via ORAL
  Filled 2021-10-15: qty 2

## 2021-10-15 MED ORDER — HEPARIN SOD (PORK) LOCK FLUSH 100 UNIT/ML IV SOLN
500.0000 [IU] | Freq: Once | INTRAVENOUS | Status: AC
  Administered 2021-10-15: 500 [IU] via INTRAVENOUS
  Filled 2021-10-15: qty 5

## 2021-10-15 MED ORDER — DIPHENHYDRAMINE HCL 25 MG PO CAPS
50.0000 mg | ORAL_CAPSULE | Freq: Once | ORAL | Status: AC
  Administered 2021-10-15: 50 mg via ORAL
  Filled 2021-10-15: qty 2

## 2021-10-15 NOTE — Patient Instructions (Addendum)
[facility] CANCER CTR AT [place]  Discharge Instructions: ?Thank you for choosing [facility] to provide your oncology and hematology care.  ?If you have a lab appointment with the [facility], please go directly to the [facility] and check in at the registration area. ? ?Wear comfortable clothing and clothing appropriate for easy access to any Portacath or PICC line.  ? ?We strive to give you quality time with your provider. You may need to reschedule your appointment if you arrive late (15 or more minutes).  Arriving late affects you and other patients whose appointments are after yours.  Also, if you miss three or more appointments without notifying the office, you may be dismissed from the clinic at the provider?s discretion.    ?  ?For prescription refill requests, have your pharmacy contact our office and allow 72 hours for refills to be completed.   ? ?Today you received the following chemotherapy and/or immunotherapy agents Darzalex  ?  ?To help prevent nausea and vomiting after your treatment, we encourage you to take your nausea medication as directed. ? ?BELOW ARE SYMPTOMS THAT SHOULD BE REPORTED IMMEDIATELY: ?*FEVER GREATER THAN 100.4 F (38 ?C) OR HIGHER ?*CHILLS OR SWEATING ?*NAUSEA AND VOMITING THAT IS NOT CONTROLLED WITH YOUR NAUSEA MEDICATION ?*UNUSUAL SHORTNESS OF BREATH ?*UNUSUAL BRUISING OR BLEEDING ?*URINARY PROBLEMS (pain or burning when urinating, or frequent urination) ?*BOWEL PROBLEMS (unusual diarrhea, constipation, pain near the anus) ?TENDERNESS IN MOUTH AND THROAT WITH OR WITHOUT PRESENCE OF ULCERS (sore throat, sores in mouth, or a toothache) ?UNUSUAL RASH, SWELLING OR PAIN  ?UNUSUAL VAGINAL DISCHARGE OR ITCHING  ? ?Items with * indicate a potential emergency and should be followed up as soon as possible or go to the Emergency Department if any problems should occur. ? ?Please show the CHEMOTHERAPY ALERT CARD or IMMUNOTHERAPY ALERT CARD at check-in to the  Emergency Department and triage nurse. ? ?Should you have questions after your visit or need to cancel or reschedule your appointment, please contact [facility] CANCER [place] AT [place]  [phone] and follow the prompts.  Office hours are 8:00 a.m. to 4:30 p.m. Monday - Friday. Please note that voicemails left after 4:00 p.m. may not be returned until the following business day.  We are closed weekends and major holidays. You have access to a nurse at all times for urgent questions. Please call the main number to the clinic [phone] and follow the prompts. ? ?For any non-urgent questions, you may also contact your provider using MyChart. We now offer e-Visits for anyone 48 and older to request care online for non-urgent symptoms. For details visit [email or web address]. ?  ?Also download the MyChart app! Go to the app store, search "MyChart", open the app, select [employer], and log in with your MyChart username and password. ? ?Due to Covid, a mask is required upon entering the hospital/clinic. If you do not have a mask, one will be given to you upon arrival. For doctor visits, patients may have 1 support person aged 48 or older with them. For treatment visits, patients cannot have anyone with them due to current Covid guidelines and our immunocompromised population.  ? ?Daratumumab injection ?What is this medication? ?DARATUMUMAB (dar a toom ue mab) is a monoclonal antibody. It is used to treat multiple myeloma. ?This medicine may be used for other purposes; ask your health care provider or pharmacist if you have questions. ?COMMON BRAND NAME(S): DARZALEX ?What should I tell my care team before I take this  medication? ?They need to know if you have any of these conditions: ?hereditary fructose intolerance ?infection (especially a virus infection such as chickenpox, herpes, or hepatitis B virus) ?lung or breathing disease (asthma, COPD) ?an unusual or allergic reaction to daratumumab, sorbitol,  other medicines, foods, dyes, or preservatives ?pregnant or trying to get pregnant ?breast-feeding ?How should I use this medication? ?This medicine is for infusion into a vein. It is given by a health care professional in a hospital or clinic setting. ?Talk to your pediatrician regarding the use of this medicine in children. Special care may be needed. ?Overdosage: If you think you have taken too much of this medicine contact a poison control center or emergency room at once. ?NOTE: This medicine is only for you. Do not share this medicine with others. ?What if I miss a dose? ?Keep appointments for follow-up doses as directed. It is important not to miss your dose. Call your doctor or health care professional if you are unable to keep an appointment. ?What may interact with this medication? ?Interactions have not been studied. ?This list may not describe all possible interactions. Give your health care provider a list of all the medicines, herbs, non-prescription drugs, or dietary supplements you use. Also tell them if you smoke, drink alcohol, or use illegal drugs. Some items may interact with your medicine. ?What should I watch for while using this medication? ?Your condition will be monitored carefully while you are receiving this medicine. ?This medicine can cause serious allergic reactions. To reduce your risk, your health care provider may give you other medicine to take before receiving this one. Be sure to follow the directions from your health care provider. ?This medicine can affect the results of blood tests to match your blood type. These changes can last for up to 6 months after the final dose. Your healthcare provider will do blood tests to match your blood type before you start treatment. Tell all of your healthcare providers that you are being treated with this medicine before receiving a blood transfusion. ?This medicine can affect the results of some tests used to determine treatment response;  extra tests may be needed to evaluate response. ?Do not become pregnant while taking this medicine or for 3 months after stopping it. Women should inform their health care provider if they wish to become pregnant or think they might be pregnant. There is a potential for serious side effects to an unborn child. Talk to your health care provider for more information. Do not breast-feed an infant while taking this medicine. ?What side effects may I notice from receiving this medication? ?Side effects that you should report to your care team as soon as possible: ?Allergic reactions--skin rash, itching or hives, swelling of the face, lips, or tongue ?Blurred vision ?Infection--fever, chills, cough, sore throat, pain or trouble passing urine ?Infusion reactions--dizziness, fast heartbeat, feeling faint or lightheaded, falls, headache, increase in blood pressure, nausea, vomiting, or wheezing or trouble breathing with loud or whistling sounds ?Unusual bleeding or bruising ?Side effects that usually do not require medical attention (report to your care team if they continue or are bothersome): ?Constipation ?Diarrhea ?Pain, tingling, numbness in the hands or feet ?Swelling of the ankles, feet, hands ?Tiredness ?This list may not describe all possible side effects. Call your doctor for medical advice about side effects. You may report side effects to FDA at 1-800-FDA-1088. ?Where should I keep my medication? ?This drug is given in a hospital or clinic and will not be stored  at home. ?NOTE: This sheet is a summary. It may not cover all possible information. If you have questions about this medicine, talk to your doctor, pharmacist, or health care provider. ?? 2022 Elsevier/Gold Standard (2020-12-18 00:00:00) ? ?

## 2021-10-15 NOTE — Progress Notes (Signed)
[facility] ?OFFICE PROGRESS NOTE ? ?Patient Care Team: ?Marguerita Merles, MD as PCP - General (Family Medicine) ?Nelva Bush, MD as PCP - Cardiology (Cardiology) ?[name], [name] ?[name], MD as Consulting Physician (Hematology and Oncology) ?Ottie Glazier, MD as Consulting Physician (Pulmonary Disease) ?Solum, Betsey Holiday, MD as Consulting Physician (Endocrinology) ? ? Cancer Staging  ?No matching staging information was found for the patient. ? ? ?Oncology History Overview Note  ?# SEP 2018- MULTIPLE MYELOMA IgALamda [2.5 gm/dl; K/L= 88/1298]; STAGE III [beta 2 microglobulin=5.5] [presented with acute renal failure; anemia; NO hypercalcemia; Skeletal survey-Normal]; BMBx- 45% plasma cells; FISH-POSITIVE 11:14 translocation.[STANDARD-high RISK]/cyto-Normal; SEP 2018- PET- L3 posterior element lesion.  ? ?# 9/14- velcade SQ twice weekly/Dex 40 mg/week; OCT 5th 2018-Start R [68m]VD; 3cycles of RVD- PARTAL RESPONSE ? ?# Jan 11th 2019-Dara-Rev-Dex; April 2019- BMBx- plasma cell -by CD-138/IHC-80% [baseline Sep 2018- 85% ]; HOLD transplant [dw Dr.Gasperatto] ? ?# April 29th 2019 2019- carfil-Cyt-Dex; AUG 6th BMBx- 6% plasma cells; VGPR ? 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 ?# may 1st week-2019- Maintenance Revlimid 10 mg 3w/1w;  ? ?FEB 10th 2021- [[facility]]Cellular marrow (50%) with normal trilineage hematopoiesis. No morphologic support for residual myeloma disease. ?Negative for minimal residual disease by MM-MRD flow cytometry; HOLD REVLIMID [leg swelling]; AUG 2021-PET scan negative for myeloma; continue to hold Revlimid ? ?# MARCH 2021-diastolic congestive heart failure [Dr.End] ? ?# BMBx- OCT 2021- BMBx- 5% plasmacytosis-however this appears to be more polyclonal rather than monoclonal-not explain patient worsening anemia.  ? ?# OCT 2022- 18th- Dara- Rev-Dex ? ?#November 2021-hyperthyroidism/goiter-  [D.[facility]/Dr.Solum]-methimazole. S/p Thyroidectomy [Dr.Kim; [facility]-AUG 2022] ? ?-------------------------- ? ?# 12/12- RIGHT JUGULAR DVT-x 377mn xarelto; finished April 2020; September 2020-EGD/dysphagia; Dr. AnVicente Males ?# Acute renal failure [Dr.Singh; Proteinuria 1.5gm/day ]; acyclovir/Asprin ?------------------------------------------------------------------------------------------------------------  ? ?DIAGNOSIS: _0  MULTIPLE MYELOMA ? ?STAGE: III/HIGH RISK ;GOALS: CONTROL ? ?CURRENT/MOST RECENT THERAPY-maintenance Revlimid- on HOLD ? ?  ?Multiple myeloma not having achieved remission ([facility]ood ?11/21/2017 - 04/28/2018 Chemotherapy  ? Patient is on Treatment Plan : MYELOMA SALVAGE Cyclophosphamide / Carfilzomib / Dexamethasone (CCd) q28d  ?   ?05/12/2021 -  Chemotherapy  ? Patient is on Treatment Plan : MYELOMA RELAPSED REFRACTORY Daratumumab SQ + Lenalidomide + Dexamethasone (DaraRd) q28d  ?   ? ?INTERVAL HISTORY: Ambulating independently.  Alone. ? ?Brandi [id number].  female pleasant patient above history of relapsed multiple myeloma--currently on daratumumab-Revlimid-dexamethasone [s/p 3 infusions of Dara] is here for follow-up.  Continues to complain of cough worse at night she is status posthospitalization for bilateral basilar pneumonia and was treated with IV antibiotics.  Reevaluation chest x-ray showed improving basilar infiltrates however not resolved.  She has undergone cataract surgery on 1 eye and awaits the second.  Currently no fevers or chills.  Slowly regaining weight that she had previously lost. Has cough late at night but otherwise feels back to baseline. Muscle cramps have nearly resolved. Denies complaints today.  ? ? ?Review of Systems  ?Constitutional:  Negative for chills, diaphoresis, fever and malaise/fatigue.  ?HENT:  Negative for nosebleeds and sore throat.   ?Eyes:  Negative for double vision.  ?Respiratory:  Positive for cough. Negative for hemoptysis.   ?Cardiovascular:  Negative for  chest pain, palpitations and orthopnea.  ?Gastrointestinal:  Negative for abdominal pain, blood in stool, constipation, heartburn and melena.  ?Genitourinary:  Negative for dysuria, frequency and urgency.  ?Musculoskeletal:  Negative for back pain.  ?  Skin: Negative.  Negative for itching and rash.  ?Neurological:  Negative for dizziness, tingling, focal weakness, weakness and headaches.  ?Endo/Heme/Allergies:  Does not bruise/bleed easily.  ?Psychiatric/Behavioral:  Negative for depression. The patient is not nervous/anxious.   ? ?PAST MEDICAL HISTORY :  ?Past Medical History:  ?Diagnosis Date  ? (HFpEF) heart failure with preserved ejection fraction ([place])   ? a. 06/2020 Echo: EF 60-65%, no rwma, nl RV size/fxn. Triv MR. Triv TR/AI. Mod elev PASP.  ? Anxiety   ? COPD (chronic obstructive pulmonary disease) ([place])   ? GERD (gastroesophageal reflux disease)   ? Hypertension   ? Hyperthyroidism   ? Hypokalemia   ? IgA myeloma ([place])   ? Multiple myeloma ([place])   ? Pneumonia   ? Red blood cell antibody positive, compatible PRBC difficult to obtain   ? S/P autologous bone marrow transplantation ([facility])   ? ? ?PAST SURGICAL HISTORY :   ?Past Surgical History:  ?Procedure Laterality Date  ? ANTERIOR VITRECTOMY Left 10/07/2021  ? Procedure: ANTERIOR VITRECTOMY;  Surgeon: Leandrew Koyanagi, MD;  Location: [facility];  Service: Ophthalmology;  Laterality: Left;  ? CATARACT EXTRACTION W/PHACO Left 10/07/2021  ? Procedure: CATARACT EXTRACTION PHACO AND INTRAOCULAR LENS PLACEMENT ([facility]) LEFT VISION BLUE;  Surgeon: Leandrew Koyanagi, MD;  Location: [facility];  Service: Ophthalmology;  Laterality: Left;  kit for manual incision ?14.56 ?01:21.4  ? ESOPHAGOGASTRODUODENOSCOPY (EGD) WITH PROPOFOL N/A 03/30/2019  ? Procedure: ESOPHAGOGASTRODUODENOSCOPY (EGD) WITH PROPOFOL;  Surgeon: Jonathon Bellows, MD;  [facility] ENDOSCOPY;  Service: Gastroenterology;  Laterality: N/A;  ? IR FLUORO GUIDE PORT INSERTION RIGHT   12/16/2017  ? IR IMAGING GUIDED PORT INSERTION  06/15/2021  ? THYROIDECTOMY N/A   ? ? ?FAMILY HISTORY :   ?Family History  ?Problem Relation Age of Onset  ? Pneumonia Mother   ? Seizures Father   ? ? ?SOCIAL HISTORY:   ?Social History  ? ?Tobacco Use  ? Smoking status: Former  ?  Packs/day: 0.25  ?  Types: Cigarettes  ?  Quit date: 03/18/2021  ?  Years since quitting: 0.5  ? Smokeless tobacco: Never  ? Tobacco comments:  ?  2 cigarettes QOD  ?Vaping Use  ? Vaping Use: Never used  ?Substance Use Topics  ? Alcohol use: No  ? Drug use: No  ? ? ?ALLERGIES:  has No Known Allergies. ? ?MEDICATIONS:  ?Current Outpatient Medications  ?Medication Sig Dispense Refill  ? acyclovir (ZOVIRAX) 400 MG tablet Take 1 tablet (400 mg total) by mouth 2 (two) times daily. 60 tablet 3  ? albuterol (VENTOLIN HFA) 108 (90 Base) MCG/ACT inhaler Inhale 2 puffs into the lungs every 6 (six) hours as needed for wheezing or shortness of breath. 8 g 3  ? aspirin EC 81 MG EC tablet Take 1 tablet (81 mg total) by mouth daily. 90 tablet 3  ? buPROPion (WELLBUTRIN) 75 MG tablet Take 75 mg by mouth daily.    ? calcium-vitamin D (OSCAL WITH D) $Remov'500MG'eVazfO$ -200UNIT (5MCG) tablet Take 1 tablet by mouth 2 (two) times daily. 60 tablet 0  ? clotrimazole-betamethasone (LOTRISONE) cream Apply topically 2 (two) times daily. 30 g 0  ? dexamethasone (DECADRON) 4 MG tablet Start 2 days prior to infusion; Take for 2 days. Do not take on the day of infusion. 60 tablet 3  ? feeding supplement, ENSURE ENLIVE, (ENSURE ENLIVE) LIQD Take 237 mLs by mouth 2 (two) times daily between meals. 60 Bottle 0  ? furosemide (LASIX) 40 MG  tablet Take 1 tablet (40 mg total) by mouth daily. 90 tablet 3  ? HYDROcodone-acetaminophen (NORCO) 5-325 MG tablet Take 1 tablet by mouth every 6 (six) hours as needed for moderate pain. 30 tablet 0  ? lenalidomide (REVLIMID) 15 MG capsule Take 1 capsule (15 mg total) by mouth daily. Take 1 capsule daily for 3 weeks and 1 week  off 21 capsule 1  ?  levothyroxine (SYNTHROID) 150 MCG tablet Take 1 tablet (150 mcg total) by mouth daily before breakfast. 30 tablet 4  ? lidocaine-prilocaine (EMLA) cream Apply 1 application topically as needed. Apply to port and c

## 2021-10-15 NOTE — Progress Notes (Signed)
Pt states she has a cough mainly at night, would like refill of her cough syrup. ? ?Also, having muscle cramps "all over" from time to time. ?

## 2021-10-19 LAB — KAPPA/LAMBDA LIGHT CHAINS
Kappa free light chain: 7.2 mg/L (ref 3.3–19.4)
Kappa, lambda light chain ratio: 0.02 — ABNORMAL LOW (ref 0.26–1.65)
Lambda free light chains: 340.2 mg/L — ABNORMAL HIGH (ref 5.7–26.3)

## 2021-10-19 NOTE — Discharge Instructions (Signed)

## 2021-10-21 ENCOUNTER — Ambulatory Visit
Admission: RE | Admit: 2021-10-21 | Discharge: 2021-10-21 | Disposition: A | Discharge status: Home / Self Care | Payer: Medicare Other | Source: Ambulatory Visit | Attending: Ophthalmology | Admitting: Ophthalmology

## 2021-10-21 ENCOUNTER — Ambulatory Visit: Payer: Medicare Other | Admitting: Anesthesiology

## 2021-10-21 ENCOUNTER — Encounter: Payer: Self-pay | Admitting: Ophthalmology

## 2021-10-21 ENCOUNTER — Encounter
Admission: RE | Disposition: A | Discharge status: Home / Self Care | Payer: Self-pay | Source: Ambulatory Visit | Attending: Ophthalmology

## 2021-10-21 ENCOUNTER — Other Ambulatory Visit: Payer: Self-pay

## 2021-10-21 DIAGNOSIS — E669 Obesity, unspecified: Secondary | ICD-10-CM | POA: Diagnosis not present

## 2021-10-21 DIAGNOSIS — J449 Chronic obstructive pulmonary disease, unspecified: Secondary | ICD-10-CM | POA: Diagnosis not present

## 2021-10-21 DIAGNOSIS — H2511 Age-related nuclear cataract, right eye: Secondary | ICD-10-CM | POA: Insufficient documentation

## 2021-10-21 DIAGNOSIS — F419 Anxiety disorder, unspecified: Secondary | ICD-10-CM | POA: Diagnosis not present

## 2021-10-21 DIAGNOSIS — Z87891 Personal history of nicotine dependence: Secondary | ICD-10-CM | POA: Insufficient documentation

## 2021-10-21 DIAGNOSIS — K219 Gastro-esophageal reflux disease without esophagitis: Secondary | ICD-10-CM | POA: Diagnosis not present

## 2021-10-21 DIAGNOSIS — I110 Hypertensive heart disease with heart failure: Secondary | ICD-10-CM | POA: Insufficient documentation

## 2021-10-21 DIAGNOSIS — I5032 Chronic diastolic (congestive) heart failure: Secondary | ICD-10-CM | POA: Insufficient documentation

## 2021-10-21 DIAGNOSIS — E039 Hypothyroidism, unspecified: Secondary | ICD-10-CM | POA: Insufficient documentation

## 2021-10-21 DIAGNOSIS — Z6832 Body mass index (BMI) 32.0-32.9, adult: Secondary | ICD-10-CM | POA: Insufficient documentation

## 2021-10-21 HISTORY — PX: CATARACT EXTRACTION W/PHACO: SHX586

## 2021-10-21 LAB — MULTIPLE MYELOMA PANEL, SERUM
Albumin SerPl Elph-Mcnc: 3.6 g/dL (ref 2.9–4.4)
Albumin/Glob SerPl: 0.8 (ref 0.7–1.7)
Alpha 1: 0.2 g/dL (ref 0.0–0.4)
Alpha2 Glob SerPl Elph-Mcnc: 0.8 g/dL (ref 0.4–1.0)
B-Globulin SerPl Elph-Mcnc: 2.1 g/dL — ABNORMAL HIGH (ref 0.7–1.3)
Gamma Glob SerPl Elph-Mcnc: 1.7 g/dL (ref 0.4–1.8)
Globulin, Total: 4.8 g/dL — ABNORMAL HIGH (ref 2.2–3.9)
IgA: 1672 mg/dL — ABNORMAL HIGH (ref 87–352)
IgG (Immunoglobin G), Serum: 534 mg/dL — ABNORMAL LOW (ref 586–1602)
IgM (Immunoglobulin M), Srm: 11 mg/dL — ABNORMAL LOW (ref 26–217)
M Protein SerPl Elph-Mcnc: 0.9 g/dL — ABNORMAL HIGH
Total Protein ELP: 8.4 g/dL (ref 6.0–8.5)

## 2021-10-21 SURGERY — PHACOEMULSIFICATION, CATARACT, WITH IOL INSERTION
Anesthesia: Monitor Anesthesia Care | Site: Eye | Laterality: Right

## 2021-10-21 MED ORDER — FENTANYL CITRATE (PF) 100 MCG/2ML IJ SOLN
INTRAMUSCULAR | Status: DC | PRN
  Administered 2021-10-21: 50 ug via INTRAVENOUS

## 2021-10-21 MED ORDER — ONDANSETRON HCL 4 MG/2ML IJ SOLN: 4.0000 mg | Freq: Once | INTRAMUSCULAR | Status: DC | PRN

## 2021-10-21 MED ORDER — SIGHTPATH DOSE#1 BSS IO SOLN
INTRAOCULAR | Status: DC | PRN
  Administered 2021-10-21: 1 mL via INTRAMUSCULAR

## 2021-10-21 MED ORDER — SIGHTPATH DOSE#1 NA HYALUR & NA CHOND-NA HYALUR IO KIT
PACK | INTRAOCULAR | Status: DC | PRN
  Administered 2021-10-21: 1 via OPHTHALMIC

## 2021-10-21 MED ORDER — LACTATED RINGERS IV SOLN: INTRAVENOUS | Status: DC

## 2021-10-21 MED ORDER — MOXIFLOXACIN HCL 0.5 % OP SOLN
OPHTHALMIC | Status: DC | PRN
  Administered 2021-10-21: 0.2 mL via OPHTHALMIC

## 2021-10-21 MED ORDER — SIGHTPATH DOSE#1 BSS IO SOLN
INTRAOCULAR | Status: DC | PRN
  Administered 2021-10-21: 119 mL via OPHTHALMIC

## 2021-10-21 MED ORDER — TETRACAINE HCL 0.5 % OP SOLN
1.0000 [drp] | OPHTHALMIC | Status: DC | PRN
  Administered 2021-10-21 (×3): 1 [drp] via OPHTHALMIC

## 2021-10-21 MED ORDER — ACETAMINOPHEN 10 MG/ML IV SOLN: 1000.0000 mg | Freq: Once | INTRAVENOUS | Status: DC | PRN

## 2021-10-21 MED ORDER — BRIMONIDINE TARTRATE-TIMOLOL 0.2-0.5 % OP SOLN
OPHTHALMIC | Status: DC | PRN
  Administered 2021-10-21: 1 [drp] via OPHTHALMIC

## 2021-10-21 MED ORDER — SIGHTPATH DOSE#1 BSS IO SOLN
INTRAOCULAR | Status: DC | PRN
  Administered 2021-10-21: 15 mL

## 2021-10-21 MED ORDER — MIDAZOLAM HCL 2 MG/2ML IJ SOLN
INTRAMUSCULAR | Status: DC | PRN
  Administered 2021-10-21: 1 mg via INTRAVENOUS

## 2021-10-21 MED ORDER — ARMC OPHTHALMIC DILATING DROPS
1.0000 "application " | OPHTHALMIC | Status: DC | PRN
  Administered 2021-10-21 (×3): 1 via OPHTHALMIC

## 2021-10-21 SURGICAL SUPPLY — 10 items
CATARACT SUITE SIGHTPATH (MISCELLANEOUS) ×2 IMPLANT
DISSECTOR HYDRO NUCLEUS 50X22 (MISCELLANEOUS) ×2 IMPLANT
DRSG TEGADERM 2-3/8X2-3/4 SM (GAUZE/BANDAGES/DRESSINGS) ×2 IMPLANT
FEE CATARACT SUITE SIGHTPATH (MISCELLANEOUS) ×1 IMPLANT
GLOVE SURG GAMMEX PI TX LF 7.5 (GLOVE) ×2 IMPLANT
GLOVE SURG SYN 8.5  E (GLOVE) ×1
GLOVE SURG SYN 8.5 E (GLOVE) ×1 IMPLANT
GLOVE SURG SYN 8.5 PF PI (GLOVE) ×1 IMPLANT
LENS IOL TECNIS EYHANCE 19.0 (Intraocular Lens) ×1 IMPLANT
WATER STERILE IRR 250ML POUR (IV SOLUTION) ×2 IMPLANT

## 2021-10-21 NOTE — Anesthesia Postprocedure Evaluation (Signed)
Anesthesia Post Note ? ?Patient: Brandi [name] ? ?Procedure(s) Performed: CATARACT EXTRACTION PHACO AND INTRAOCULAR LENS PLACEMENT (IOC) RIGHT 11.12 01:48.1 (Right: Eye) ? ? ?  ?Patient location during evaluation: PACU ?Anesthesia Type: MAC ?Level of consciousness: awake and alert ?Pain management: pain level controlled ?Vital Signs Assessment: post-procedure vital signs reviewed and stable ?Respiratory status: spontaneous breathing, nonlabored ventilation, respiratory function stable and patient connected to nasal cannula oxygen ?Cardiovascular status: stable and blood pressure returned to baseline ?Postop Assessment: no apparent nausea or vomiting ?Anesthetic complications: no ? ? ?No notable events documented. ? ?Alexy Heldt A  Tymira Horkey ? ? ? ? ? ?

## 2021-10-21 NOTE — Anesthesia Procedure Notes (Signed)
Procedure Name: [place] ?Date/Time: 10/21/2021 1:22 PM ?Performed by: Mayme Genta, CRNA ?Pre-anesthesia Checklist: Patient identified, Emergency Drugs available, Suction available, Timeout performed and Patient being monitored ?Patient Re-evaluated:Patient Re-evaluated prior to induction ?Oxygen Delivery Method: Nasal cannula ?Placement Confirmation: positive ETCO2 ? ? ? ? ?

## 2021-10-21 NOTE — Transfer of Care (Signed)
Immediate Anesthesia Transfer of Care Note ? ?Patient: Brandi [name] ? ?Procedure(s) Performed: CATARACT EXTRACTION PHACO AND INTRAOCULAR LENS PLACEMENT (IOC) RIGHT 11.12 01:48.1 (Right: Eye) ? ?Patient Location: PACU ? ?Anesthesia Type: MAC ? ?Level of Consciousness: awake, alert  and patient cooperative ? ?Airway and Oxygen Therapy: Patient Spontanous Breathing and Patient connected to supplemental oxygen ? ?Post-op Assessment: Post-op Vital signs reviewed, Patient's Cardiovascular Status Stable, Respiratory Function Stable, Patent Airway and No signs of Nausea or vomiting ? ?Post-op Vital Signs: Reviewed and stable ? ?Complications: No notable events documented. ? ?

## 2021-10-21 NOTE — H&P (Signed)
?[facility]  ? ?Primary Care Physician:  Marguerita Merles, MD ?Ophthalmologist: Dr. Leandrew Koyanagi ? ?Pre-Procedure History & Physical: ?HPI:  Brandi [name] is a 71 y.o. female here for ophthalmic surgery. ?  ?Past Medical History:  ?Diagnosis Date  ? (HFpEF) heart failure with preserved ejection fraction ([place])   ? a. 06/2020 Echo: EF 60-65%, no rwma, nl RV size/fxn. Triv MR. Triv TR/AI. Mod elev PASP.  ? Anxiety   ? COPD (chronic obstructive pulmonary disease) ([place])   ? GERD (gastroesophageal reflux disease)   ? Hypertension   ? Hyperthyroidism   ? Hypokalemia   ? IgA myeloma ([place])   ? Multiple myeloma ([place])   ? Pneumonia   ? Red blood cell antibody positive, compatible PRBC difficult to obtain   ? S/P autologous bone marrow transplantation ([place])   ? ? ?Past Surgical History:  ?Procedure Laterality Date  ? ANTERIOR VITRECTOMY Left 10/07/2021  ? Procedure: ANTERIOR VITRECTOMY;  Surgeon: Leandrew Koyanagi, MD;  Location: [place];  Service: Ophthalmology;  Laterality: Left;  ? CATARACT EXTRACTION W/PHACO Left 10/07/2021  ? Procedure: CATARACT EXTRACTION PHACO AND INTRAOCULAR LENS PLACEMENT (Ord) LEFT VISION BLUE;  Surgeon: Leandrew Koyanagi, MD;  Location: [place];  Service: Ophthalmology;  Laterality: Left;  kit for manual incision ?14.56 ?01:21.4  ? ESOPHAGOGASTRODUODENOSCOPY (EGD) WITH PROPOFOL N/A 03/30/2019  ? Procedure: ESOPHAGOGASTRODUODENOSCOPY (EGD) WITH PROPOFOL;  Surgeon: Jonathon Bellows, MD;  Location: [facility] ENDOSCOPY;  Service: Gastroenterology;  Laterality: N/A;  ? IR FLUORO GUIDE PORT INSERTION RIGHT  12/16/2017  ? IR IMAGING GUIDED PORT INSERTION  06/15/2021  ? THYROIDECTOMY N/A   ? ? ?Prior to Admission medications   ?Medication Sig Start Date End Date Taking? Authorizing Provider  ?acyclovir (ZOVIRAX) 400 MG tablet Take 1 tablet (400 mg total) by mouth 2 (two) times daily. 04/28/21  Yes [name], MD  ?albuterol (VENTOLIN HFA) 108 (90 Base) MCG/ACT  inhaler Inhale 2 puffs into the lungs every 6 (six) hours as needed for wheezing or shortness of breath. 04/28/21  Yes [name], MD  ?aspirin EC 81 MG EC tablet Take 1 tablet (81 mg total) by mouth daily. 01/25/20  Yes Loel Dubonnet, NP  ?buPROPion (WELLBUTRIN) 75 MG tablet Take 75 mg by mouth daily. 03/24/21  Yes [provider]  ?clotrimazole-betamethasone (LOTRISONE) cream Apply topically 2 (two) times daily. 08/13/21  Yes Borders, Kirt Boys, NP  ?dexamethasone (DECADRON) 4 MG tablet Start 2 days prior to infusion; Take for 2 days. Do not take on the day of infusion. 08/20/21  Yes [name], MD  ?feeding supplement, ENSURE ENLIVE, (ENSURE ENLIVE) LIQD Take 237 mLs by mouth 2 (two) times daily between meals. 04/12/17  Yes Gouru, Illene Silver, MD  ?furosemide (LASIX) 40 MG tablet Take 1 tablet (40 mg total) by mouth daily. 10/02/21  Yes Furth, Cadence H, PA-C  ?guaiFENesin-codeine 100-10 MG/5ML syrup Take 5 mLs by mouth every 12 (twelve) hours as needed for cough. May cause drowsiness. Do not drive with this medication. 10/15/21  Yes Verlon Au, NP  ?HYDROcodone-acetaminophen (NORCO) 5-325 MG tablet Take 1 tablet by mouth every 6 (six) hours as needed for moderate pain. 08/13/21  Yes Borders, Kirt Boys, NP  ?lenalidomide (REVLIMID) 15 MG capsule Take 1 capsule (15 mg total) by mouth daily. Take for 21 days, then hold for 7 days. Repeat every 28 days. 10/15/21  Yes [name], MD  ?levothyroxine (SYNTHROID) 150 MCG tablet Take 1 tablet (150 mcg  total) by mouth daily before breakfast. 09/17/21  Yes [name], [name], MD  ?magnesium oxide (MAG-OX) 400 (240 Mg) MG tablet Take 1 tablet (400 mg total) by mouth 2 (two) times daily. 09/17/21  Yes [name], [name], MD  ?metoprolol succinate (TOPROL-XL) 25 MG 24 hr tablet Take 1 tablet (25 mg total) by mouth daily. 10/02/21  Yes Furth, Cadence H, PA-C  ?montelukast (SINGULAIR) 10 MG tablet Take 1 tablet (10 mg total) by mouth at  bedtime. 07/03/21  Yes [name], [name], MD  ?ondansetron (ZOFRAN) 8 MG tablet One pill every 8 hours as needed for nausea/vomitting. 04/28/21  Yes [name], [name], MD  ?pantoprazole (PROTONIX) 40 MG tablet Take 1 tablet by mouth once daily 08/06/21  Yes [name], [name], MD  ?polyethylene glycol (MIRALAX / GLYCOLAX) packet Take 17 g by mouth daily as needed for mild constipation. 04/11/17  Yes Gouru, Aruna, MD  ?potassium chloride SA (KLOR-CON) 20 MEQ tablet Take 1 tablet (20 mEq total) by mouth 2 (two) times daily. 04/28/21  Yes [name], [name], MD  ?spironolactone (ALDACTONE) 25 MG tablet Take 1 tablet by mouth once daily 05/11/21  Yes Visser, Jacquelyn D, PA-C  ?Vitamin D, Ergocalciferol, (DRISDOL) 1.25 MG (50000 UNIT) CAPS capsule Take 1 capsule by mouth once a week 08/06/21  Yes [name], [name], MD  ?calcium-vitamin D (OSCAL WITH D) 500MG-200UNIT (5MCG) tablet Take 1 tablet by mouth 2 (two) times daily. 05/18/21   Borders, Joshua R, NP  ?lidocaine-prilocaine (EMLA) cream Apply 1 application topically as needed. Apply to port and cover with saran wrap 1-2 hours prior to port access 07/03/21   [name], [name], MD  ? ? ?Allergies as of 09/01/2021  ? (No Known Allergies)  ? ? ?Family History  ?Problem Relation Age of Onset  ? Pneumonia Mother   ? Seizures Father   ? ? ?Social History  ? ?Socioeconomic History  ? Marital status: Single  ?  Spouse name: Not on file  ? Number of children: 3  ? Years of education: Not on file  ? Highest education level: Not on file  ?Occupational History  ? Not on file  ?Tobacco Use  ? Smoking status: Former  ?  Packs/day: 0.25  ?  Types: Cigarettes  ?  Quit date: 03/18/2021  ?  Years since quitting: 0.5  ? Smokeless tobacco: Never  ? Tobacco comments:  ?  2 cigarettes QOD  ?Vaping Use  ? Vaping Use: Never used  ?Substance and Sexual Activity  ? Alcohol use: No  ? Drug use: No  ? Sexual activity: Not on file  ?Other Topics Concern  ? Not on file  ?Social  History Narrative  ? Lives at home with family(son): daughter 8 blocks away. States "I'm never alone." Independent at baseline  ? ?Social Determinants of Health  ? ?Financial Resource Strain: Not on file  ?Food Insecurity: Not on file  ?Transportation Needs: Not on file  ?Physical Activity: Not on file  ?Stress: Not on file  ?Social Connections: Not on file  ?Intimate Partner Violence: Not on file  ? ? ?Review of Systems: ?See HPI, otherwise negative ROS ? ?Physical Exam: ?BP (!) 143/76   Pulse (!) 50   Temp (!) 97.2 ?F (36.2 ?C) (Temporal)   Ht 5' 2" (1.575 m)   Wt 79.8 kg   SpO2 99%   BMI 32.19 kg/m?  ?General:   Alert,  pleasant and cooperative in NAD ?Head:  Normocephalic and atraumatic. ?Lungs:  Clear   to auscultation.    ?Heart:  Regular rate and rhythm. ? ?Impression/Plan: ?KERRIN MARKMAN is here for ophthalmic surgery. ? ?Risks, benefits, limitations, and alternatives regarding ophthalmic surgery have been reviewed with the patient.  Questions have been answered.  All parties agreeable. ? ? Leandrew Koyanagi, MD  10/21/2021, 1:10 PM ? ?

## 2021-10-21 NOTE — Anesthesia Preprocedure Evaluation (Signed)
Anesthesia Evaluation  ?Patient identified by MRN, date of birth, ID band ?Patient awake ? ? ? ?Reviewed: ?Allergy & Precautions, H&P , NPO status , Patient's Chart, lab work & pertinent test results, reviewed documented beta blocker date and time  ? ?History of Anesthesia Complications ?Negative for: history of anesthetic complications ? ?Airway ?Mallampati: III ? ?TM Distance: >3 FB ?Neck ROM: Limited ? ? ? Dental ?no notable dental hx. ? ?  ?Pulmonary ?COPD, former smoker,  ?  ?Pulmonary exam normal ?breath sounds clear to auscultation ? ? ? ? ? ? Cardiovascular ?Exercise Tolerance: Good ?hypertension, (-) angina(-) DOE  ?Rhythm:regular Rate:Normal ? ?06/2020 Echo: EF 60-65%, no rwma, nl RV size/fxn. Triv MR. Triv TR/AI. Mod elev PASP. ?  ?Neuro/Psych ?Anxiety   ? GI/Hepatic ?GERD  Controlled,  ?Endo/Other  ?Hypothyroidism  ? Renal/GU ?  ? ?  ?Musculoskeletal ? ? Abdominal ?(+) + obese (BMI 32),   ?Peds ? Hematology ?  ?Anesthesia Other Findings ?Multiple myeloma s/p BMT ? Reproductive/Obstetrics ? ?  ? ? ? ? ? ? ? ? ? ? ? ? ? ?  ?  ? ? ? ? ? ? ? ? ?Anesthesia Physical ? ?Anesthesia Plan ? ?ASA: 3 ? ?Anesthesia Plan: MAC  ? ?Post-op Pain Management:   ? ?Induction: Intravenous ? ?PONV Risk Score and Plan: 2 and Treatment may vary due to age or medical condition, TIVA and Midazolam ? ?Airway Management Planned: Nasal Cannula ? ?Additional Equipment:  ? ?Intra-op Plan:  ? ?Post-operative Plan:  ? ?Informed Consent: I have reviewed the patients History and Physical, chart, labs and discussed the procedure including the risks, benefits and alternatives for the proposed anesthesia with the patient or authorized representative who has indicated his/her understanding and acceptance.  ? ? ? ?Dental Advisory Given ? ?Plan Discussed with: CRNA ? ?Anesthesia Plan Comments:   ? ? ? ? ? ? ?Anesthesia Quick Evaluation ? ?

## 2021-10-21 NOTE — Op Note (Addendum)
LOCATION:  [place] ?  ?PREOPERATIVE DIAGNOSIS:    Nuclear sclerotic cataract right eye. H25.11 ?  ?POSTOPERATIVE DIAGNOSIS:  Nuclear sclerotic cataract right eye.   ?  ?PROCEDURE:  Phacoemusification with posterior chamber intraocular lens placement of the right eye  ? ?ULTRASOUND TIME: Procedure(s): ?CATARACT EXTRACTION PHACO AND INTRAOCULAR LENS PLACEMENT (IOC) RIGHT 11.12 01:48.1 (Right) ? ?LENS:   ?Implant Name Type Inv. Item Serial No. Manufacturer Lot No. LRB No. Used Action  ?LENS IOL TECNIS EYHANCE 19.0 - [id number] Intraocular Lens LENS IOL TECNIS EYHANCE 19.0 [id number] [employer]  Right 1 Implanted  ?   ?  ?  ?SURGEON:  Merleen Nicely, MD ?Assistance surgeon: Wyonia Hough, MD ?  ?ANESTHESIA:  Topical with tetracaine drops and 2% Xylocaine jelly, augmented with 1% preservative-free intracameral lidocaine. ? ?  ?COMPLICATIONS:  None. ?  ?DESCRIPTION OF PROCEDURE:  The patient was identified in the holding room and transported to the operating room and placed in the supine position under the operating microscope.  The right eye was identified as the operative eye and it was prepped and draped in the usual sterile ophthalmic fashion. ?  ?A 1 millimeter clear-corneal paracentesis was made at the superotemporal position.  0.5 ml of preservative-free 1% lidocaine was injected into the anterior chamber. ?The anterior chamber was filled with Viscoat viscoelastic.  A 2.4 millimeter keratome was used to make a near-clear corneal incision at the inferotemporal position.  A curvilinear capsulorrhexis was made with a cystotome and capsulorrhexis forceps.  Balanced salt solution was used to hydrodissect and hydrodelineate the nucleus. ?  ?Phacoemulsification was then used in stop and chop fashion to remove the lens nucleus and epinucleus.  The remaining cortex was then removed using the irrigation and aspiration handpiece. Provisc was then placed into the capsular bag to distend it for lens  placement.  A +19.00 D DIB00 lens was then injected into the capsular bag.  The remaining viscoelastic was aspirated. ?  ?Wounds were hydrated with balanced salt solution.  The anterior chamber was inflated to a physiologic pressure with balanced salt solution.  No wound leaks were noted. Vigamox was injected intracamerally.  Timolol and Brimonidine drops were applied to the eye.  The patient was taken to the recovery room in stable condition without complications of anesthesia or surgery. ? ? ?Aahna Rossa ?10/21/2021, 1:48 PM ? ?

## 2021-10-22 ENCOUNTER — Encounter: Payer: Self-pay | Admitting: Ophthalmology

## 2021-10-23 ENCOUNTER — Inpatient Hospital Stay: Payer: Medicare Other

## 2021-10-23 ENCOUNTER — Inpatient Hospital Stay: Payer: Medicare Other | Admitting: Nurse Practitioner

## 2021-10-28 ENCOUNTER — Other Ambulatory Visit: Payer: Self-pay

## 2021-10-28 MED ORDER — SPIRONOLACTONE 25 MG PO TABS: 25.0000 mg | ORAL_TABLET | Freq: Every day | ORAL | 0 refills | Status: DC

## 2021-11-12 ENCOUNTER — Inpatient Hospital Stay: Payer: Medicare Other | Attending: Nurse Practitioner

## 2021-11-12 ENCOUNTER — Inpatient Hospital Stay: Payer: Medicare Other

## 2021-11-12 ENCOUNTER — Inpatient Hospital Stay (HOSPITAL_BASED_OUTPATIENT_CLINIC_OR_DEPARTMENT_OTHER): Payer: Medicare Other | Admitting: Internal Medicine

## 2021-11-12 ENCOUNTER — Other Ambulatory Visit: Payer: Self-pay | Admitting: Internal Medicine

## 2021-11-12 ENCOUNTER — Encounter: Payer: Self-pay | Admitting: Internal Medicine

## 2021-11-12 DIAGNOSIS — C9000 Multiple myeloma not having achieved remission: Secondary | ICD-10-CM

## 2021-11-12 DIAGNOSIS — C9002 Multiple myeloma in relapse: Secondary | ICD-10-CM | POA: Diagnosis not present

## 2021-11-12 DIAGNOSIS — Z79899 Other long term (current) drug therapy: Secondary | ICD-10-CM | POA: Insufficient documentation

## 2021-11-12 DIAGNOSIS — Z5112 Encounter for antineoplastic immunotherapy: Secondary | ICD-10-CM | POA: Insufficient documentation

## 2021-11-12 DIAGNOSIS — Z95828 Presence of other vascular implants and grafts: Secondary | ICD-10-CM

## 2021-11-12 LAB — CBC WITH DIFFERENTIAL/PLATELET
Abs Immature Granulocytes: 0.03 10*3/uL (ref 0.00–0.07)
Basophils Absolute: 0 10*3/uL (ref 0.0–0.1)
Basophils Relative: 0 %
Eosinophils Absolute: 0 10*3/uL (ref 0.0–0.5)
Eosinophils Relative: 0 %
HCT: 27.3 % — ABNORMAL LOW (ref 36.0–46.0)
Hemoglobin: 8.8 g/dL — ABNORMAL LOW (ref 12.0–15.0)
Immature Granulocytes: 1 %
Lymphocytes Relative: 26 %
Lymphs Abs: 1.7 10*3/uL (ref 0.7–4.0)
MCH: 30.4 pg (ref 26.0–34.0)
MCHC: 32.2 g/dL (ref 30.0–36.0)
MCV: 94.5 fL (ref 80.0–100.0)
Monocytes Absolute: 0.7 10*3/uL (ref 0.1–1.0)
Monocytes Relative: 11 %
Neutro Abs: 4.2 10*3/uL (ref 1.7–7.7)
Neutrophils Relative %: 62 %
Platelets: 184 10*3/uL (ref 150–400)
RBC: 2.89 MIL/uL — ABNORMAL LOW (ref 3.87–5.11)
RDW: 16.8 % — ABNORMAL HIGH (ref 11.5–15.5)
WBC: 6.6 10*3/uL (ref 4.0–10.5)
nRBC: 0 % (ref 0.0–0.2)

## 2021-11-12 LAB — COMPREHENSIVE METABOLIC PANEL
ALT: 17 U/L (ref 0–44)
AST: 22 U/L (ref 15–41)
Albumin: 3.7 g/dL (ref 3.5–5.0)
Alkaline Phosphatase: 77 U/L (ref 38–126)
Anion gap: 9 (ref 5–15)
BUN: 20 mg/dL (ref 8–23)
CO2: 21 mmol/L — ABNORMAL LOW (ref 22–32)
Calcium: 8.4 mg/dL — ABNORMAL LOW (ref 8.9–10.3)
Chloride: 108 mmol/L (ref 98–111)
Creatinine, Ser: 0.95 mg/dL (ref 0.44–1.00)
GFR, Estimated: >60 mL/min (ref >60)
Glucose, Bld: 106 mg/dL — ABNORMAL HIGH (ref 70–99)
Potassium: 3.5 mmol/L (ref 3.5–5.1)
Sodium: 138 mmol/L (ref 135–145)
Total Bilirubin: 0.2 mg/dL — ABNORMAL LOW (ref 0.3–1.2)
Total Protein: 8.2 g/dL — ABNORMAL HIGH (ref 6.5–8.1)

## 2021-11-12 LAB — MAGNESIUM: Magnesium: 2.2 mg/dL (ref 1.7–2.4)

## 2021-11-12 LAB — TSH: TSH: 0.302 u[IU]/mL — ABNORMAL LOW (ref 0.350–4.500)

## 2021-11-12 MED ORDER — DARATUMUMAB-HYALURONIDASE-FIHJ 1800-30000 MG-UT/15ML ~~LOC~~ SOLN
1800.0000 mg | Freq: Once | SUBCUTANEOUS | Status: AC
  Administered 2021-11-12: 1800 mg via SUBCUTANEOUS
  Filled 2021-11-12: qty 15

## 2021-11-12 MED ORDER — DIPHENHYDRAMINE HCL 25 MG PO CAPS
50.0000 mg | ORAL_CAPSULE | Freq: Once | ORAL | Status: AC
  Administered 2021-11-12: 50 mg via ORAL
  Filled 2021-11-12: qty 2

## 2021-11-12 MED ORDER — ACETAMINOPHEN 325 MG PO TABS
650.0000 mg | ORAL_TABLET | Freq: Once | ORAL | Status: AC
  Administered 2021-11-12: 650 mg via ORAL
  Filled 2021-11-12: qty 2

## 2021-11-12 MED ORDER — DEXAMETHASONE 4 MG PO TABS
20.0000 mg | ORAL_TABLET | Freq: Once | ORAL | Status: AC
  Administered 2021-11-12: 20 mg via ORAL
  Filled 2021-11-12: qty 5

## 2021-11-12 MED ORDER — HEPARIN SOD (PORK) LOCK FLUSH 100 UNIT/ML IV SOLN
500.0000 [IU] | Freq: Once | INTRAVENOUS | Status: AC
  Administered 2021-11-12: 500 [IU] via INTRAVENOUS
  Filled 2021-11-12: qty 5

## 2021-11-12 NOTE — Patient Instructions (Signed)
[facility] [place]  Discharge Instructions: ?Thank you for choosing [facility] to provide your oncology and hematology care.  ?If you have a lab appointment with the [facility], please go directly to the [facility] and check in at the registration area. ? ?Wear comfortable clothing and clothing appropriate for easy access to any Portacath or PICC line.  ? ?We strive to give you quality time with your provider. You may need to reschedule your appointment if you arrive late (15 or more minutes).  Arriving late affects you and other patients whose appointments are after yours.  Also, if you miss three or more appointments without notifying the office, you may be dismissed from the clinic at the provider?s discretion.    ?  ?For prescription refill requests, have your pharmacy contact our office and allow 72 hours for refills to be completed.   ? ?Today you received the following chemotherapy and/or immunotherapy agents DARZALEX    ?  ?To help prevent nausea and vomiting after your treatment, we encourage you to take your nausea medication as directed. ? ?BELOW ARE SYMPTOMS THAT SHOULD BE REPORTED IMMEDIATELY: ?*FEVER GREATER THAN 100.4 F (38 ?C) OR HIGHER ?*CHILLS OR SWEATING ?*NAUSEA AND VOMITING THAT IS NOT CONTROLLED WITH YOUR NAUSEA MEDICATION ?*UNUSUAL SHORTNESS OF BREATH ?*UNUSUAL BRUISING OR BLEEDING ?*URINARY PROBLEMS (pain or burning when urinating, or frequent urination) ?*BOWEL PROBLEMS (unusual diarrhea, constipation, pain near the anus) ?TENDERNESS IN MOUTH AND THROAT WITH OR WITHOUT PRESENCE OF ULCERS (sore throat, sores in mouth, or a toothache) ?UNUSUAL RASH, SWELLING OR PAIN  ?UNUSUAL VAGINAL DISCHARGE OR ITCHING  ? ?Items with * indicate a potential emergency and should be followed up as soon as possible or go to the Emergency Department if any problems should occur. ? ?Please show the CHEMOTHERAPY ALERT CARD or IMMUNOTHERAPY ALERT CARD at check-in to  the Emergency Department and triage nurse. ? ?Should you have questions after your visit or need to cancel or reschedule your appointment, please contact [facility] [place] [facility] [place]  [phone] and follow the prompts.  Office hours are 8:00 a.m. to 4:30 p.m. Monday - Friday. Please note that voicemails left after 4:00 p.m. may not be returned until the following business day.  We are closed weekends and major holidays. You have access to a nurse at all times for urgent questions. Please call the main number to the clinic [phone] and follow the prompts. ? ?For any non-urgent questions, you may also contact your provider using MyChart. We now offer e-Visits for anyone 8 and older to request care online for non-urgent symptoms. For details visit [email or web address]. ?  ?Also download the MyChart app! Go to the app store, search "MyChart", open the app, select [facility], and log in with your MyChart username and password. ? ?Due to Covid, a mask is required upon entering the hospital/clinic. If you do not have a mask, one will be given to you upon arrival. For doctor visits, patients may have 1 support person aged 82 or older with them. For treatment visits, patients cannot have anyone with them due to current Covid guidelines and our immunocompromised population.  ? ?Daratumumab injection ?What is this medication? ?DARATUMUMAB (dar a toom ue mab) is a monoclonal antibody. It is used to treat multiple myeloma. ?This medicine may be used for other purposes; ask your health care provider or pharmacist if you have questions. ?COMMON BRAND NAME(S): DARZALEX ?What should I tell my care team before I  take this medication? ?They need to know if you have any of these conditions: ?hereditary fructose intolerance ?infection (especially a virus infection such as chickenpox, herpes, or hepatitis B virus) ?lung or breathing disease (asthma, COPD) ?an unusual or allergic reaction to daratumumab,  sorbitol, other medicines, foods, dyes, or preservatives ?pregnant or trying to get pregnant ?breast-feeding ?How should I use this medication? ?This medicine is for infusion into a vein. It is given by a health care professional in a hospital or clinic setting. ?Talk to your pediatrician regarding the use of this medicine in children. Special care may be needed. ?Overdosage: If you think you have taken too much of this medicine contact a poison control center or emergency room at once. ?NOTE: This medicine is only for you. Do not share this medicine with others. ?What if I miss a dose? ?Keep appointments for follow-up doses as directed. It is important not to miss your dose. Call your doctor or health care professional if you are unable to keep an appointment. ?What may interact with this medication? ?Interactions have not been studied. ?This list may not describe all possible interactions. Give your health care provider a list of all the medicines, herbs, non-prescription drugs, or dietary supplements you use. Also tell them if you smoke, drink alcohol, or use illegal drugs. Some items may interact with your medicine. ?What should I watch for while using this medication? ?Your condition will be monitored carefully while you are receiving this medicine. ?This medicine can cause serious allergic reactions. To reduce your risk, your health care provider may give you other medicine to take before receiving this one. Be sure to follow the directions from your health care provider. ?This medicine can affect the results of blood tests to match your blood type. These changes can last for up to 6 months after the final dose. Your healthcare provider will do blood tests to match your blood type before you start treatment. Tell all of your healthcare providers that you are being treated with this medicine before receiving a blood transfusion. ?This medicine can affect the results of some tests used to determine treatment  response; extra tests may be needed to evaluate response. ?Do not become pregnant while taking this medicine or for 3 months after stopping it. Women should inform their health care provider if they wish to become pregnant or think they might be pregnant. There is a potential for serious side effects to an unborn child. Talk to your health care provider for more information. Do not breast-feed an infant while taking this medicine. ?What side effects may I notice from receiving this medication? ?Side effects that you should report to your care team as soon as possible: ?Allergic reactions--skin rash, itching or hives, swelling of the face, lips, or tongue ?Blurred vision ?Infection--fever, chills, cough, sore throat, pain or trouble passing urine ?Infusion reactions--dizziness, fast heartbeat, feeling faint or lightheaded, falls, headache, increase in blood pressure, nausea, vomiting, or wheezing or trouble breathing with loud or whistling sounds ?Unusual bleeding or bruising ?Side effects that usually do not require medical attention (report to your care team if they continue or are bothersome): ?Constipation ?Diarrhea ?Pain, tingling, numbness in the hands or feet ?Swelling of the ankles, feet, hands ?Tiredness ?This list may not describe all possible side effects. Call your doctor for medical advice about side effects. You may report side effects to FDA at 1-800-FDA-1088. ?Where should I keep my medication? ?This drug is given in a hospital or clinic and will not  be stored at home. ?NOTE: This sheet is a summary. It may not cover all possible information. If you have questions about this medicine, talk to your doctor, pharmacist, or health care provider. ?? 2023 Elsevier/Gold Standard (2020-12-18 00:00:00) ? ?

## 2021-11-12 NOTE — Assessment & Plan Note (Addendum)
#  RECURRENT Multiple myeloma stage III [high-risk cytogenetics-status post KRD-VGPR ; status post autologous?stem cell transplant on 06/15/18. JAN 2023-M protein-1.1 g/dL.  Kappa/lambda light chain ratio= 221.  Patient currently on salvage therapy with daratumumab  lenalidomide [15 mg 3 w; 1-w] dexamethasone.  ? ?#Continue Dara IV infusion weekly; continue REVLIMID 15 mg again. 3 weeks/1 week off; continue. Labs today reviewed;  acceptable for treatment today.  Await myeloma panel from today. ? ?# Iatrogenic hypothyroidism [ Graves/goiter s/p total thyroidectomy;aug,2022 ]- STABLE; JAN TSH-8; increased to 150 mcg/da. Check thyroid profile at next visit.  ? ?# Muscle cramps- add Mag Ox BID; if diarrhea- take one a day. ? ?# Chronic diastolic CHF-clinically stable at this time. ? ?# Bone lesions /last zometa on 11/27/2017.  Holding Zometa secondary to poor dentition. ? ?# IV access: mediport  ? ?:MM panle; K/l light chain ratio q 4 W ? ?# DISPOSITION:  ?#  Dara today ?# 1 weeks- labs- cbc/cmp; dara IV ?#  Follow up 2 Week-- MD; labs-cbc/cmp;;port flush- Dara IV-Dr.B ? ? ?

## 2021-11-12 NOTE — Progress Notes (Signed)
Patient reports muscle cramps has slightly improved.  Hasn't had a right neck cramp that radiates to her hand for the past month.   ?

## 2021-11-12 NOTE — Progress Notes (Signed)
[facility] ?OFFICE PROGRESS NOTE ? ?Patient Care Team: ?Marguerita Merles, MD as PCP - General (Family Medicine) ?Nelva Bush, MD as PCP - Cardiology (Cardiology) ?[name], [name] ?[name], MD as Consulting Physician (Hematology and Oncology) ?Ottie Glazier, MD as Consulting Physician (Pulmonary Disease) ?Solum, Betsey Holiday, MD as Consulting Physician (Endocrinology) ? ? Cancer Staging  ?No matching staging information was found for the patient. ? ? ?Oncology History Overview Note  ?# SEP 2018- MULTIPLE MYELOMA [place] [2.5 gm/dl; K/L= 88/1298]; STAGE III [beta 2 microglobulin=5.5] [presented with acute renal failure; anemia; NO hypercalcemia; Skeletal survey-Normal]; BMBx- 45% plasma cells; FISH-POSITIVE 11:14 translocation.[STANDARD-high RISK]/cyto-Normal; SEP 2018- PET- L3 posterior element lesion.  ? ?# 9/14- velcade SQ twice weekly/Dex 40 mg/week; OCT 5th 2018-Start R [$RemoveBefor'10mg'DsmZsyaJqKiM$ ]VD; 3cycles of RVD- PARTAL RESPONSE ? ?# Jan 11th 2019-Dara-Rev-Dex; April 2019- BMBx- plasma cell -by CD-138/IHC-80% [baseline Sep 2018- 85% ]; HOLD transplant [dw Dr.Gasperatto] ? ?# April 29th 2019 2019- carfil-Cyt-Dex; AUG 6th BMBx- 6% plasma cells; VGPR ? 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 ?# may 1st week-2019- Maintenance Revlimid 10 mg 3w/1w;  ? ?FEB 10th 2021- [[facility]]Cellular marrow (50%) with normal trilineage hematopoiesis. No morphologic support for residual myeloma disease. ?Negative for minimal residual disease by MM-MRD flow cytometry; HOLD REVLIMID [leg swelling]; AUG 2021-PET scan negative for myeloma; continue to hold Revlimid ? ?# MARCH 2021-diastolic congestive heart failure [Dr.End] ? ?# BMBx- OCT 2021- BMBx- 5% plasmacytosis-however this appears to be more polyclonal rather than monoclonal-not explain patient worsening anemia.  ? ?# OCT 2022- 18th- Dara- Rev-Dex ? ?#November 2021-hyperthyroidism/goiter-  [D.[facility]/Dr.Solum]-methimazole. S/p Thyroidectomy [Dr.Kim; [facility]-AUG 2022] ? ?-------------------------- ? ?# 12/12- RIGHT JUGULAR DVT-x 24m on xarelto; finished April 2020; September 2020-EGD/dysphagia; Dr. Vicente Males ? ?# Acute renal failure [Dr.Singh; Proteinuria 1.5gm/day ]; acyclovir/Asprin ?------------------------------------------------------------------------------------------------------------  ? ?DIAGNOSIS: $RemoveBefor'[ ]'AaoBKbPEjGzn$  MULTIPLE MYELOMA ? ?STAGE: III/HIGH RISK ;GOALS: CONTROL ? ?CURRENT/MOST RECENT THERAPY-maintenance Revlimid- on HOLD ? ?  ?Multiple myeloma not having achieved remission ([name])  ?11/21/2017 - 04/28/2018 Chemotherapy  ? Patient is on Treatment Plan : MYELOMA SALVAGE Cyclophosphamide / Carfilzomib / Dexamethasone (CCd) q28d  ? ?  ?  ?05/12/2021 -  Chemotherapy  ? Patient is on Treatment Plan : MYELOMA RELAPSED REFRACTORY Daratumumab SQ + Lenalidomide + Dexamethasone (DaraRd) q28d  ? ?  ?  ? ?INTERVAL HISTORY: Ambulating independently.  Alone. ? ?Brandi [name] 71 y.o.  female pleasant patient above history of relapsed multiple myeloma--currently on daratumumab-Revlimid-dexamethasone  is here for follow-up. ? ?In the interim patient has had no hospital admission.  Appetite is improving. Currently no fever no chills.  Patient is gaining weight.  No infusion reactions. ? ? She is currently s/p cataract surgery on March 23.  She is able to sleep better. ? ?Review of Systems  ?Constitutional:  Positive for malaise/fatigue. Negative for chills, diaphoresis and fever.  ?HENT:  Negative for nosebleeds and sore throat.   ?Eyes:  Negative for double vision.  ?Respiratory:  Negative for hemoptysis.   ?Cardiovascular:  Negative for chest pain, palpitations and orthopnea.  ?Gastrointestinal:  Negative for abdominal pain, blood in stool, constipation, heartburn and melena.  ?Genitourinary:  Negative for dysuria, frequency and urgency.  ?Musculoskeletal:  Positive for back pain.  ?Skin: Negative.  Negative for itching  and rash.  ?Neurological:  Negative for dizziness, tingling, focal weakness, weakness and headaches.  ?Endo/Heme/Allergies:  Does not bruise/bleed easily.  ?Psychiatric/Behavioral:  Negative for depression. The patient is not nervous/anxious.   ? ?  PAST MEDICAL HISTORY :  ?Past Medical History:  ?Diagnosis Date  ?? (HFpEF) heart failure with preserved ejection fraction ([place])   ? a. 06/2020 Echo: EF 60-65%, no rwma, nl RV size/fxn. Triv MR. Triv TR/AI. Mod elev PASP.  ?? Anxiety   ?? COPD (chronic obstructive pulmonary disease) ([place])   ?? GERD (gastroesophageal reflux disease)   ?? Hypertension   ?? Hyperthyroidism   ?? Hypokalemia   ?? IgA myeloma ([place])   ?? Multiple myeloma ([place])   ?? Pneumonia   ?? Red blood cell antibody positive, compatible PRBC difficult to obtain   ?? S/P autologous bone marrow transplantation ([place])   ? ? ?PAST SURGICAL HISTORY :   ?Past Surgical History:  ?Procedure Laterality Date  ?? ANTERIOR VITRECTOMY Left 10/07/2021  ? Procedure: ANTERIOR VITRECTOMY;  Surgeon: Leandrew Koyanagi, MD;  Location: [place] [facility];  Service: Ophthalmology;  Laterality: Left;  ?? CATARACT EXTRACTION W/PHACO Left 10/07/2021  ? Procedure: CATARACT EXTRACTION PHACO AND INTRAOCULAR LENS PLACEMENT ([place]) LEFT VISION BLUE;  Surgeon: Leandrew Koyanagi, MD;  Location: [place];  Service: Ophthalmology;  Laterality: Left;  kit for manual incision ?14.56 ?01:21.4  ?? CATARACT EXTRACTION W/PHACO Right 10/21/2021  ? Procedure: CATARACT EXTRACTION PHACO AND INTRAOCULAR LENS PLACEMENT ([facility]) RIGHT 11.12 01:48.1;  Surgeon: Leandrew Koyanagi, MD;  Location: [facility] [facility];  Service: Ophthalmology;  Laterality: Right;  ?? ESOPHAGOGASTRODUODENOSCOPY (EGD) WITH PROPOFOL N/A 03/30/2019  ? Procedure: ESOPHAGOGASTRODUODENOSCOPY (EGD) WITH PROPOFOL;  Surgeon: Jonathon Bellows, MD;  [facility] ENDOSCOPY;  Service: Gastroenterology;  Laterality: N/A;  ?? IR FLUORO GUIDE PORT INSERTION RIGHT  12/16/2017  ?? IR  IMAGING GUIDED PORT INSERTION  06/15/2021  ?? THYROIDECTOMY N/A   ? ? ?FAMILY HISTORY :   ?Family History  ?Problem Relation Age of Onset  ?? Pneumonia Mother   ?? Seizures Father   ? ? ?SOCIAL HISTORY:   ?Social History  ? ?Tobacco Use  ?? Smoking status: Former  ?  Packs/day: 0.25  ?  Types: Cigarettes  ?  Quit date: 03/18/2021  ?  Years since quitting: 0.6  ?? Smokeless tobacco: Never  ?? Tobacco comments:  ?  2 cigarettes QOD  ?Vaping Use  ?? Vaping Use: Never used  ?Substance Use Topics  ?? Alcohol use: No  ?? Drug use: No  ? ? ?ALLERGIES:  has No Known Allergies. ? ?MEDICATIONS:  ?Current Outpatient Medications  ?Medication Sig Dispense Refill  ?? acyclovir (ZOVIRAX) 400 MG tablet Take 1 tablet (400 mg total) by mouth 2 (two) times daily. 60 tablet 3  ?? albuterol (VENTOLIN HFA) 108 (90 Base) MCG/ACT inhaler Inhale 2 puffs into the lungs every 6 (six) hours as needed for wheezing or shortness of breath. 8 g 3  ?? aspirin EC 81 MG EC tablet Take 1 tablet (81 mg total) by mouth daily. 90 tablet 3  ?? buPROPion (WELLBUTRIN) 75 MG tablet Take 75 mg by mouth daily.    ?? calcium-vitamin D (OSCAL WITH D) $Remov'500MG'BGNXgm$ -200UNIT (5MCG) tablet Take 1 tablet by mouth 2 (two) times daily. 60 tablet 0  ?? clotrimazole-betamethasone (LOTRISONE) cream Apply topically 2 (two) times daily. 30 g 0  ?? dexamethasone (DECADRON) 4 MG tablet Start 2 days prior to infusion; Take for 2 days. Do not take on the day of infusion. 60 tablet 3  ?? feeding supplement, ENSURE ENLIVE, (ENSURE ENLIVE) LIQD Take 237 mLs by mouth 2 (two) times daily between meals. 60 Bottle 0  ?? furosemide (LASIX) 40 MG tablet Take 1 tablet (  40 mg total) by mouth daily. 90 tablet 3  ?? lenalidomide (REVLIMID) 15 MG capsule Take 1 capsule (15 mg total) by mouth daily. Take for 21 days, then hold for 7 days. Repeat every 28 days. 21 capsule 0  ?? levothyroxine (SYNTHROID) 150 MCG tablet Take 1 tablet (150 mcg total) by mouth daily before breakfast. 30 tablet 4  ??  lidocaine-prilocaine (EMLA) cream Apply 1 application topically as needed. Apply to port and cover with saran wrap 1-2 hours prior to port access 30 g 1  ?? magnesium oxide (MAG-OX) 400 (240 Mg) MG tablet Take 1 t

## 2021-11-13 ENCOUNTER — Encounter: Payer: Self-pay | Admitting: Internal Medicine

## 2021-11-13 LAB — KAPPA/LAMBDA LIGHT CHAINS
Kappa free light chain: 5 mg/L (ref 3.3–19.4)
Kappa, lambda light chain ratio: 0.02 — ABNORMAL LOW (ref 0.26–1.65)
Lambda free light chains: 223.6 mg/L — ABNORMAL HIGH (ref 5.7–26.3)

## 2021-11-16 ENCOUNTER — Ambulatory Visit: Payer: Medicare Other | Admitting: Medical

## 2021-11-16 ENCOUNTER — Other Ambulatory Visit: Payer: Self-pay | Admitting: *Deleted

## 2021-11-16 DIAGNOSIS — C9002 Multiple myeloma in relapse: Secondary | ICD-10-CM

## 2021-11-16 LAB — MULTIPLE MYELOMA PANEL, SERUM
Albumin SerPl Elph-Mcnc: 3.4 g/dL (ref 2.9–4.4)
Albumin/Glob SerPl: 0.9 (ref 0.7–1.7)
Alpha 1: 0.1 g/dL (ref 0.0–0.4)
Alpha2 Glob SerPl Elph-Mcnc: 0.6 g/dL (ref 0.4–1.0)
B-Globulin SerPl Elph-Mcnc: 1.4 g/dL — ABNORMAL HIGH (ref 0.7–1.3)
Gamma Glob SerPl Elph-Mcnc: 1.9 g/dL — ABNORMAL HIGH (ref 0.4–1.8)
Globulin, Total: 4.1 g/dL — ABNORMAL HIGH (ref 2.2–3.9)
IgA: 1175 mg/dL — ABNORMAL HIGH (ref 87–352)
IgG (Immunoglobin G), Serum: 382 mg/dL — ABNORMAL LOW (ref 586–1602)
IgM (Immunoglobulin M), Srm: 8 mg/dL — ABNORMAL LOW (ref 26–217)
M Protein SerPl Elph-Mcnc: 1.2 g/dL — ABNORMAL HIGH
Total Protein ELP: 7.5 g/dL (ref 6.0–8.5)

## 2021-11-17 ENCOUNTER — Telehealth: Payer: Self-pay | Admitting: *Deleted

## 2021-11-17 ENCOUNTER — Encounter: Payer: Self-pay | Admitting: Internal Medicine

## 2021-11-17 MED ORDER — LENALIDOMIDE 15 MG PO CAPS: 15.0000 mg | ORAL_CAPSULE | Freq: Every day | ORAL | 0 refills | Status: DC

## 2021-11-17 NOTE — Telephone Encounter (Signed)
Patient called asking for some medicine for muscle spasms. Please advise ?

## 2021-11-18 ENCOUNTER — Other Ambulatory Visit: Payer: Self-pay | Admitting: Internal Medicine

## 2021-11-18 ENCOUNTER — Other Ambulatory Visit: Payer: Self-pay

## 2021-11-18 MED ORDER — TRAMADOL HCL 50 MG PO TABS: 50.0000 mg | ORAL_TABLET | Freq: Two times a day (BID) | ORAL | 0 refills | Status: DC | PRN

## 2021-11-19 ENCOUNTER — Inpatient Hospital Stay: Payer: Medicare Other

## 2021-11-19 VITALS — BP 125/55 | HR 52 | Temp 97.0°F | Resp 17 | Wt 179.0 lb

## 2021-11-19 DIAGNOSIS — Z5112 Encounter for antineoplastic immunotherapy: Secondary | ICD-10-CM | POA: Diagnosis not present

## 2021-11-19 DIAGNOSIS — C9000 Multiple myeloma not having achieved remission: Secondary | ICD-10-CM

## 2021-11-19 LAB — CBC WITH DIFFERENTIAL/PLATELET
Abs Immature Granulocytes: 0.01 10*3/uL (ref 0.00–0.07)
Basophils Absolute: 0 10*3/uL (ref 0.0–0.1)
Basophils Relative: 0 %
Eosinophils Absolute: 0 10*3/uL (ref 0.0–0.5)
Eosinophils Relative: 0 %
HCT: 32.2 % — ABNORMAL LOW (ref 36.0–46.0)
Hemoglobin: 10.4 g/dL — ABNORMAL LOW (ref 12.0–15.0)
Immature Granulocytes: 0 %
Lymphocytes Relative: 22 %
Lymphs Abs: 0.9 10*3/uL (ref 0.7–4.0)
MCH: 30.4 pg (ref 26.0–34.0)
MCHC: 32.3 g/dL (ref 30.0–36.0)
MCV: 94.2 fL (ref 80.0–100.0)
Monocytes Absolute: 0.1 10*3/uL (ref 0.1–1.0)
Monocytes Relative: 4 %
Neutro Abs: 2.9 10*3/uL (ref 1.7–7.7)
Neutrophils Relative %: 74 %
Platelets: 222 10*3/uL (ref 150–400)
RBC: 3.42 MIL/uL — ABNORMAL LOW (ref 3.87–5.11)
RDW: 16.8 % — ABNORMAL HIGH (ref 11.5–15.5)
WBC: 3.9 10*3/uL — ABNORMAL LOW (ref 4.0–10.5)
nRBC: 0 % (ref 0.0–0.2)

## 2021-11-19 LAB — COMPREHENSIVE METABOLIC PANEL
ALT: 28 U/L (ref 0–44)
AST: 32 U/L (ref 15–41)
Albumin: 3.8 g/dL (ref 3.5–5.0)
Alkaline Phosphatase: 80 U/L (ref 38–126)
Anion gap: 11 (ref 5–15)
BUN: 22 mg/dL (ref 8–23)
CO2: 20 mmol/L — ABNORMAL LOW (ref 22–32)
Calcium: 8.5 mg/dL — ABNORMAL LOW (ref 8.9–10.3)
Chloride: 105 mmol/L (ref 98–111)
Creatinine, Ser: 1 mg/dL (ref 0.44–1.00)
GFR, Estimated: >60 mL/min (ref >60)
Glucose, Bld: 128 mg/dL — ABNORMAL HIGH (ref 70–99)
Potassium: 3.9 mmol/L (ref 3.5–5.1)
Sodium: 136 mmol/L (ref 135–145)
Total Bilirubin: 0.5 mg/dL (ref 0.3–1.2)
Total Protein: 8.8 g/dL — ABNORMAL HIGH (ref 6.5–8.1)

## 2021-11-19 MED ORDER — HEPARIN SOD (PORK) LOCK FLUSH 100 UNIT/ML IV SOLN
INTRAVENOUS | Status: AC
  Administered 2021-11-19: 500 [IU] via INTRAVENOUS
  Filled 2021-11-19: qty 5

## 2021-11-19 MED ORDER — HEPARIN SOD (PORK) LOCK FLUSH 100 UNIT/ML IV SOLN
500.0000 [IU] | Freq: Once | INTRAVENOUS | Status: AC
  Filled 2021-11-19: qty 5

## 2021-11-19 MED ORDER — DIPHENHYDRAMINE HCL 25 MG PO CAPS
50.0000 mg | ORAL_CAPSULE | Freq: Once | ORAL | Status: AC
  Administered 2021-11-19: 50 mg via ORAL
  Filled 2021-11-19: qty 2

## 2021-11-19 MED ORDER — DARATUMUMAB-HYALURONIDASE-FIHJ 1800-30000 MG-UT/15ML ~~LOC~~ SOLN
1800.0000 mg | Freq: Once | SUBCUTANEOUS | Status: AC
  Administered 2021-11-19: 1800 mg via SUBCUTANEOUS
  Filled 2021-11-19: qty 15

## 2021-11-19 MED ORDER — ACETAMINOPHEN 325 MG PO TABS
650.0000 mg | ORAL_TABLET | Freq: Once | ORAL | Status: AC
  Administered 2021-11-19: 650 mg via ORAL
  Filled 2021-11-19: qty 2

## 2021-11-19 MED ORDER — SODIUM CHLORIDE 0.9% FLUSH
10.0000 mL | Freq: Once | INTRAVENOUS | Status: AC
  Administered 2021-11-19: 10 mL via INTRAVENOUS
  Filled 2021-11-19: qty 10

## 2021-11-19 MED ORDER — DEXAMETHASONE 4 MG PO TABS
20.0000 mg | ORAL_TABLET | Freq: Once | ORAL | Status: AC
  Administered 2021-11-19: 20 mg via ORAL
  Filled 2021-11-19: qty 5

## 2021-11-19 NOTE — Patient Instructions (Addendum)
[facility]CER CTR AT [place]  Discharge Instructions: ?Thank you for choosing [facility] to provide your oncology and hematology care.  ?If you have a lab appointment with the [facility], please go directly to the [facility] and check in at the registration area. ? ?Wear comfortable clothing and clothing appropriate for easy access to any Portacath or PICC line.  ? ?We strive to give you quality time with your provider. You may need to reschedule your appointment if you arrive late (15 or more minutes).  Arriving late affects you and other patients whose appointments are after yours.  Also, if you miss three or more appointments without notifying the office, you may be dismissed from the clinic at the provider?s discretion.    ?  ?For prescription refill requests, have your pharmacy contact our office and allow 72 hours for refills to be completed.   ? ?Today you received the following chemotherapy and/or immunotherapy agents darzalex ?  ?To help prevent nausea and vomiting after your treatment, we encourage you to take your nausea medication as directed. ? ?BELOW ARE SYMPTOMS THAT SHOULD BE REPORTED IMMEDIATELY: ?*FEVER GREATER THAN 100.4 F (38 ?C) OR HIGHER ?*CHILLS OR SWEATING ?*NAUSEA AND VOMITING THAT IS NOT CONTROLLED WITH YOUR NAUSEA MEDICATION ?*UNUSUAL SHORTNESS OF BREATH ?*UNUSUAL BRUISING OR BLEEDING ?*URINARY PROBLEMS (pain or burning when urinating, or frequent urination) ?*BOWEL PROBLEMS (unusual diarrhea, constipation, pain near the anus) ?TENDERNESS IN MOUTH AND THROAT WITH OR WITHOUT PRESENCE OF ULCERS (sore throat, sores in mouth, or a toothache) ?UNUSUAL RASH, SWELLING OR PAIN  ?UNUSUAL VAGINAL DISCHARGE OR ITCHING  ? ?Items with * indicate a potential emergency and should be followed up as soon as possible or go to the Emergency Department if any problems should occur. ? ?Please show the CHEMOTHERAPY ALERT CARD or IMMUNOTHERAPY ALERT CARD at check-in to the  Emergency Department and triage nurse. ? ?Should you have questions after your visit or need to cancel or reschedule your appointment, please contact [facility] CANCER [place] AT [place]  [phone] and follow the prompts.  Office hours are 8:00 a.m. to 4:30 p.m. Monday - Friday. Please note that voicemails left after 4:00 p.m. may not be returned until the following business day.  We are closed weekends and major holidays. You have access to a nurse at all times for urgent questions. Please call the main number to the clinic [phone] and follow the prompts. ? ?For any non-urgent questions, you may also contact your provider using MyChart. We now offer e-Visits for anyone 80 and older to request care online for non-urgent symptoms. For details visit [email or web address]. ?  ?Also download the MyChart app! Go to the app store, search "MyChart", open the app, select [facility], and log in with your MyChart username and password. ? ?Due to Covid, a mask is required upon entering the hospital/clinic. If you do not have a mask, one will be given to you upon arrival. For doctor visits, patients may have 1 support person aged 67 or older with them. For treatment visits, patients cannot have anyone with them due to current Covid guidelines and our immunocompromised population.  ?

## 2021-11-19 NOTE — Telephone Encounter (Signed)
Tramadol was sent in to the pharm. ?

## 2021-11-20 ENCOUNTER — Encounter: Payer: Self-pay | Admitting: Internal Medicine

## 2021-11-26 ENCOUNTER — Telehealth: Payer: Self-pay | Admitting: *Deleted

## 2021-11-26 ENCOUNTER — Inpatient Hospital Stay (HOSPITAL_BASED_OUTPATIENT_CLINIC_OR_DEPARTMENT_OTHER): Payer: Medicare Other | Admitting: Internal Medicine

## 2021-11-26 ENCOUNTER — Inpatient Hospital Stay: Payer: Medicare Other | Attending: Internal Medicine

## 2021-11-26 ENCOUNTER — Encounter: Payer: Self-pay | Admitting: Internal Medicine

## 2021-11-26 ENCOUNTER — Inpatient Hospital Stay: Payer: Medicare Other

## 2021-11-26 DIAGNOSIS — C9000 Multiple myeloma not having achieved remission: Secondary | ICD-10-CM

## 2021-11-26 DIAGNOSIS — C9002 Multiple myeloma in relapse: Secondary | ICD-10-CM

## 2021-11-26 DIAGNOSIS — Z79899 Other long term (current) drug therapy: Secondary | ICD-10-CM | POA: Insufficient documentation

## 2021-11-26 DIAGNOSIS — Z5112 Encounter for antineoplastic immunotherapy: Secondary | ICD-10-CM | POA: Insufficient documentation

## 2021-11-26 LAB — COMPREHENSIVE METABOLIC PANEL
ALT: 16 U/L (ref 0–44)
AST: 19 U/L (ref 15–41)
Albumin: 3.7 g/dL (ref 3.5–5.0)
Alkaline Phosphatase: 80 U/L (ref 38–126)
Anion gap: 11 (ref 5–15)
BUN: 18 mg/dL (ref 8–23)
CO2: 22 mmol/L (ref 22–32)
Calcium: 9 mg/dL (ref 8.9–10.3)
Chloride: 106 mmol/L (ref 98–111)
Creatinine, Ser: 0.95 mg/dL (ref 0.44–1.00)
GFR, Estimated: >60 mL/min (ref >60)
Glucose, Bld: 123 mg/dL — ABNORMAL HIGH (ref 70–99)
Potassium: 3.5 mmol/L (ref 3.5–5.1)
Sodium: 139 mmol/L (ref 135–145)
Total Bilirubin: 0.4 mg/dL (ref 0.3–1.2)
Total Protein: 8.3 g/dL — ABNORMAL HIGH (ref 6.5–8.1)

## 2021-11-26 LAB — CBC WITH DIFFERENTIAL/PLATELET
Abs Immature Granulocytes: 0.05 10*3/uL (ref 0.00–0.07)
Basophils Absolute: 0 10*3/uL (ref 0.0–0.1)
Basophils Relative: 0 %
Eosinophils Absolute: 0 10*3/uL (ref 0.0–0.5)
Eosinophils Relative: 0 %
HCT: 30.5 % — ABNORMAL LOW (ref 36.0–46.0)
Hemoglobin: 10 g/dL — ABNORMAL LOW (ref 12.0–15.0)
Immature Granulocytes: 1 %
Lymphocytes Relative: 17 %
Lymphs Abs: 1.1 10*3/uL (ref 0.7–4.0)
MCH: 31 pg (ref 26.0–34.0)
MCHC: 32.8 g/dL (ref 30.0–36.0)
MCV: 94.4 fL (ref 80.0–100.0)
Monocytes Absolute: 0.3 10*3/uL (ref 0.1–1.0)
Monocytes Relative: 4 %
Neutro Abs: 4.8 10*3/uL (ref 1.7–7.7)
Neutrophils Relative %: 78 %
Platelets: 288 10*3/uL (ref 150–400)
RBC: 3.23 MIL/uL — ABNORMAL LOW (ref 3.87–5.11)
RDW: 17.3 % — ABNORMAL HIGH (ref 11.5–15.5)
WBC: 6.2 10*3/uL (ref 4.0–10.5)
nRBC: 0 % (ref 0.0–0.2)

## 2021-11-26 LAB — MAGNESIUM: Magnesium: 1.9 mg/dL (ref 1.7–2.4)

## 2021-11-26 LAB — TSH: TSH: 0.279 u[IU]/mL — ABNORMAL LOW (ref 0.350–4.500)

## 2021-11-26 MED ORDER — DARATUMUMAB-HYALURONIDASE-FIHJ 1800-30000 MG-UT/15ML ~~LOC~~ SOLN
1800.0000 mg | Freq: Once | SUBCUTANEOUS | Status: AC
  Administered 2021-11-26: 1800 mg via SUBCUTANEOUS
  Filled 2021-11-26: qty 15

## 2021-11-26 MED ORDER — DIPHENHYDRAMINE HCL 25 MG PO CAPS
50.0000 mg | ORAL_CAPSULE | Freq: Once | ORAL | Status: AC
  Administered 2021-11-26: 50 mg via ORAL
  Filled 2021-11-26: qty 2

## 2021-11-26 MED ORDER — ACETAMINOPHEN 325 MG PO TABS
650.0000 mg | ORAL_TABLET | Freq: Once | ORAL | Status: AC
  Administered 2021-11-26: 650 mg via ORAL
  Filled 2021-11-26: qty 2

## 2021-11-26 MED ORDER — HEPARIN SOD (PORK) LOCK FLUSH 100 UNIT/ML IV SOLN
500.0000 [IU] | Freq: Once | INTRAVENOUS | Status: AC
  Administered 2021-11-26: 500 [IU] via INTRAVENOUS
  Filled 2021-11-26: qty 5

## 2021-11-26 MED ORDER — DEXAMETHASONE 4 MG PO TABS
20.0000 mg | ORAL_TABLET | Freq: Once | ORAL | Status: AC
  Administered 2021-11-26: 20 mg via ORAL
  Filled 2021-11-26: qty 5

## 2021-11-26 NOTE — Telephone Encounter (Signed)
After speaking to Dr. Willette Alma office.  The patient came in today and was talking to Dr. Rogue Bussing about dental issues.  Dr. Rogue Bussing called over and gave the cell phone to the staff to tell Dr. Nicola Girt to call him at his convenience.  The dental staff does not know if Dr. Nicola Girt has spoke to Dr. Rogue Bussing yet but they do have the information to get in touch with Dr. Rogue Bussing about this patient.  Per the staff the patient will need some extractions and she is on blood thinners; that is why they were calling for. ?

## 2021-11-26 NOTE — Assessment & Plan Note (Addendum)
#  RECURRENT Multiple myeloma stage III [high-risk cytogenetics-status post KRD-VGPR ; status post autologous?stem cell transplant on 06/15/18. JAN 2023-M protein-1.1 g/dL.  Kappa/lambda light chain ratio= 221.  Patient currently on salvage therapy with daratumumab  lenalidomide [15 mg 3 w; 1-w] dexamethasone.  ? ?#Continue Dara SQ weekly; continue REVLIMID 15 mg again. 3 weeks/1 week off; continue. Labs today reviewed;  acceptable for treatment today. April 2023- 1.2 gm; K/L ? ?# Iatrogenic hypothyroidism [ Graves/goiter s/p total thyroidectomy;aug,2022 ]- STABLE; JAN TSH-8; continue 150 mcg/day.  Awaiting TSH today. ? ?# Muscle cramps-?  Revlimid.  If worsening consider switching to Pomalyst.  On tramadol q 12 hours prn-STABLE.  ? ?# Chronic diastolic CHF-clinically stable at this time. ? ?# Bone lesions /last zometa on 11/27/2017.  Holding Zometa secondary to poor dentition. ? ?# IV access: mediport  ? ?:MM panle; K/l light chain ratio q 4 W ? ?# DISPOSITION:  ?#  Dara today ?# 1 weeks- labs- cbc/cmp; dara SQ ?#  Follow up 2 Week-- MD;port- labs-cbc/cmp;Thyroid panel; MM panel; K/l light chains--- Dara SQ Dr.B ? ? ?

## 2021-11-26 NOTE — Patient Instructions (Signed)
[facility] CANCER CTR AT [place]  Discharge Instructions: ?Thank you for choosing [facility] to provide your oncology and hematology care.  ?If you have a lab appointment with the [facility], please go directly to the [facility] and check in at the registration area. ? ?Wear comfortable clothing and clothing appropriate for easy access to any Portacath or PICC line.  ? ?We strive to give you quality time with your provider. You may need to reschedule your appointment if you arrive late (15 or more minutes).  Arriving late affects you and other patients whose appointments are after yours.  Also, if you miss three or more appointments without notifying the office, you may be dismissed from the clinic at the provider?s discretion.    ?  ?For prescription refill requests, have your pharmacy contact our office and allow 72 hours for refills to be completed.   ? ?Today you received the following chemotherapy and/or immunotherapy agents: Darzalex Faspro    ?  ?To help prevent nausea and vomiting after your treatment, we encourage you to take your nausea medication as directed. ? ?BELOW ARE SYMPTOMS THAT SHOULD BE REPORTED IMMEDIATELY: ?*FEVER GREATER THAN 100.4 F (38 ?C) OR HIGHER ?*CHILLS OR SWEATING ?*NAUSEA AND VOMITING THAT IS NOT CONTROLLED WITH YOUR NAUSEA MEDICATION ?*UNUSUAL SHORTNESS OF BREATH ?*UNUSUAL BRUISING OR BLEEDING ?*URINARY PROBLEMS (pain or burning when urinating, or frequent urination) ?*BOWEL PROBLEMS (unusual diarrhea, constipation, pain near the anus) ?TENDERNESS IN MOUTH AND THROAT WITH OR WITHOUT PRESENCE OF ULCERS (sore throat, sores in mouth, or a toothache) ?UNUSUAL RASH, SWELLING OR PAIN  ?UNUSUAL VAGINAL DISCHARGE OR ITCHING  ? ?Items with * indicate a potential emergency and should be followed up as soon as possible or go to the Emergency Department if any problems should occur. ? ?Please show the CHEMOTHERAPY ALERT CARD or IMMUNOTHERAPY ALERT CARD at  check-in to the Emergency Department and triage nurse. ? ?Should you have questions after your visit or need to cancel or reschedule your appointment, please contact [facility] CANCER [place] AT [place]  [phone] and follow the prompts.  Office hours are 8:00 a.m. to 4:30 p.m. Monday - Friday. Please note that voicemails left after 4:00 p.m. may not be returned until the following business day.  We are closed weekends and major holidays. You have access to a nurse at all times for urgent questions. Please call the main number to the clinic [phone] and follow the prompts. ? ?For any non-urgent questions, you may also contact your provider using MyChart. We now offer e-Visits for anyone 11 and older to request care online for non-urgent symptoms. For details visit [email or web address]. ?  ?Also download the MyChart app! Go to the app store, search "MyChart", open the app, select [facility], and log in with your MyChart username and password. ? ?Due to Covid, a mask is required upon entering the hospital/clinic. If you do not have a mask, one will be given to you upon arrival. For doctor visits, patients may have 1 support person aged 48 or older with them. For treatment visits, patients cannot have anyone with them due to current Covid guidelines and our immunocompromised population.  ?

## 2021-11-26 NOTE — Progress Notes (Signed)
C/o back pain when she stands, comes and goes but getting to the point she can't do usual activities like before. ?

## 2021-11-26 NOTE — Progress Notes (Signed)
[place] ?OFFICE PROGRESS NOTE ? ?Patient Care Team: ?Marguerita Merles, MD as PCP - General (Family Medicine) ?Nelva Bush, MD as PCP - Cardiology (Cardiology) ?[name], [name] ?[name], MD as Consulting Physician (Hematology and Oncology) ?Ottie Glazier, MD as Consulting Physician (Pulmonary Disease) ?Solum, Betsey Holiday, MD as Consulting Physician (Endocrinology) ? ? Cancer Staging  ?No matching staging information was found for the patient. ? ? ?Oncology History Overview Note  ?# SEP 2018- MULTIPLE MYELOMA IgAL[place]da [2.5 gm/dl; K/L= 88/1298]; STAGE III [beta 2 microglobulin=5.5] [presented with acute renal failure; anemia; NO hypercalcemia; Skeletal survey-Normal]; BMBx- 45% plasma cells; FISH-POSITIVE 11:14 translocation.[STANDARD-high RISK]/cyto-Normal; SEP 2018- PET- L3 posterior element lesion.  ? ?# 9/14- velcade SQ twice weekly/Dex 40 mg/week; OCT 5th 2018-Start R [2m]VD; 3cycles of RVD- PARTAL RESPONSE ? ?# Jan 11th 2019-Dara-Rev-Dex; April 2019- BMBx- plasma cell -by CD-138/IHC-80% [baseline Sep 2018- 85% ]; HOLD transplant [dw Dr.Gasperatto] ? ?# April 29th 2019 2019- carfil-Cyt-Dex; AUG 6th BMBx- 6% plasma cells; VGPR ? 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 ?# may 1st week-2019- Maintenance Revlimid 10 mg 3w/1w;  ? ?FEB 10th 2021- [[facility]]Cellular marrow (50%) with normal trilineage hematopoiesis. No morphologic support for residual myeloma disease. ?Negative for minimal residual disease by MM-MRD flow cytometry; HOLD REVLIMID [leg swelling]; AUG 2021-PET scan negative for myeloma; continue to hold Revlimid ? ?# MARCH 2021-diastolic congestive heart failure [Dr.End] ? ?# BMBx- OCT 2021- BMBx- 5% plasmacytosis-however this appears to be more polyclonal rather than monoclonal-not explain patient worsening anemia.  ? ?# OCT 2022- 18th- Dara- Rev-Dex ? ?#November 2021-hyperthyroidism/goiter-  [D.Bennett/Dr.Solum]-methimazole. S/p Thyroidectomy [Dr.Kim; [facility]G 2022] ? ?-------------------------- ? ?# 12/12- RIGHT JUGULAR DVT-x 316mn xarelto; finished April 2020; September 2020-EGD/dysphagia; Dr. AnVicente Males ?# Acute renal failure [Dr.Singh; Proteinuria 1.5gm/day ]; acyclovir/Asprin ?------------------------------------------------------------------------------------------------------------  ? ?DIAGNOSIS: _0  MULTIPLE MYELOMA ? ?STAGE: III/HIGH RISK ;GOALS: CONTROL ? ?CURRENT/MOST RECENT THERAPY-maintenance Revlimid- on HOLD ? ?  ?Multiple myeloma not having achieved remission ([place] ?11/21/2017 - 04/28/2018 Chemotherapy  ? Patient is on Treatment Plan : MYELOMA SALVAGE Cyclophosphamide / Carfilzomib / Dexamethasone (CCd) q28d  ? ?  ?  ?05/12/2021 -  Chemotherapy  ? Patient is on Treatment Plan : MYELOMA RELAPSED REFRACTORY Daratumumab SQ + Lenalidomide + Dexamethasone (DaraRd) q28d  ? ?  ?  ? ?INTERVAL HISTORY: Ambulating independently.  Alone. ? ?Brandi [id number].  female pleasant patient above history of relapsed multiple myeloma--currently on daratumumab-Revlimid-dexamethasone  is here for follow-up. ? ?In the interim patient called our office for "muscle spasms all over".  Improved since starting the tramadol taking twice a day.  Otherwise no new hospitalizations. ? ? Appetite is improving. Currently no fever no chills.  Patient is gaining weight.  No infusion reactions. ? ? ?Review of Systems  ?Constitutional:  Positive for malaise/fatigue. Negative for chills, diaphoresis and fever.  ?HENT:  Negative for nosebleeds and sore throat.   ?Eyes:  Negative for double vision.  ?Respiratory:  Negative for hemoptysis.   ?Cardiovascular:  Negative for chest pain, palpitations and orthopnea.  ?Gastrointestinal:  Negative for abdominal pain, blood in stool, constipation, heartburn and melena.  ?Genitourinary:  Negative for dysuria, frequency and urgency.  ?Musculoskeletal:  Positive for back pain.  ?Skin:  Negative.  Negative for itching and rash.  ?Neurological:  Negative for dizziness, tingling, focal weakness, weakness and headaches.  ?Endo/Heme/Allergies:  Does not bruise/bleed easily.  ?Psychiatric/Behavioral:  Negative for depression. The patient is  not nervous/anxious.   ? ?PAST MEDICAL HISTORY :  ?Past Medical History:  ?Diagnosis Date  ? (HFpEF) heart failure with preserved ejection fraction ([place])   ? a. 06/2020 Echo: EF 60-65%, no rwma, nl RV size/fxn. Triv MR. Triv TR/AI. Mod elev PASP.  ? Anxiety   ? COPD (chronic obstructive pulmonary disease) ([place])   ? GERD (gastroesophageal reflux disease)   ? Hypertension   ? Hyperthyroidism   ? Hypokalemia   ? IgA myeloma ([place])   ? Multiple myeloma ([place])   ? Pneumonia   ? Red blood cell antibody positive, compatible PRBC difficult to obtain   ? S/P autologous bone marrow transplantation ([place])   ? ? ?PAST SURGICAL HISTORY :   ?Past Surgical History:  ?Procedure Laterality Date  ? ANTERIOR VITRECTOMY Left 10/07/2021  ? Procedure: ANTERIOR VITRECTOMY;  Surgeon: Leandrew Koyanagi, MD;  Location: [place];  Service: Ophthalmology;  Laterality: Left;  ? CATARACT EXTRACTION W/PHACO Left 10/07/2021  ? Procedure: CATARACT EXTRACTION PHACO AND INTRAOCULAR LENS PLACEMENT ([place]) LEFT VISION BLUE;  Surgeon: Leandrew Koyanagi, MD;  Location: [place];  Service: Ophthalmology;  Laterality: Left;  kit for manual incision ?14.56 ?01:21.4  ? CATARACT EXTRACTION W/PHACO Right 10/21/2021  ? Procedure: CATARACT EXTRACTION PHACO AND INTRAOCULAR LENS PLACEMENT ([facility]) RIGHT 11.12 01:48.1;  Surgeon: Leandrew Koyanagi, MD;  Location: [place];  Service: Ophthalmology;  Laterality: Right;  ? ESOPHAGOGASTRODUODENOSCOPY (EGD) WITH PROPOFOL N/A 03/30/2019  ? Procedure: ESOPHAGOGASTRODUODENOSCOPY (EGD) WITH PROPOFOL;  Surgeon: Jonathon Bellows, MD;  Location[facility] END[facility]COPY;  Service: Gastroenterology;  Laterality: N/A;  ? IR FLUORO GUIDE PORT INSERTION RIGHT   12/16/2017  ? IR IMAGING GUIDED PORT INSERTION  06/15/2021  ? THYROIDECTOMY N/A   ? ? ?FAMILY HISTORY :   ?Family History  ?Problem Relation Age of Onset  ? Pneumonia Mother   ? Seizures Father   ? ? ?SOCIAL HISTORY:   ?Social History  ? ?Tobacco Use  ? Smoking status: Former  ?  Packs/day: 0.25  ?  Types: Cigarettes  ?  Quit date: 03/18/2021  ?  Years since quitting: 0.6  ? Smokeless tobacco: Never  ? Tobacco comments:  ?  2 cigarettes QOD  ?Vaping Use  ? Vaping Use: Never used  ?Substance Use Topics  ? Alcohol use: No  ? Drug use: No  ? ? ?ALLERGIES:  has No Known Allergies. ? ?MEDICATIONS:  ?Current Outpatient Medications  ?Medication Sig Dispense Refill  ? acyclovir (ZOVIRAX) 400 MG tablet Take 1 tablet (400 mg total) by mouth 2 (two) times daily. 60 tablet 3  ? albuterol (VENTOLIN HFA) 108 (90 Base) MCG/ACT inhaler Inhale 2 puffs into the lungs every 6 (six) hours as needed for wheezing or shortness of breath. 8 g 3  ? aspirin EC 81 MG EC tablet Take 1 tablet (81 mg total) by mouth daily. 90 tablet 3  ? buPROPion (WELLBUTRIN) 75 MG tablet Take 75 mg by mouth daily.    ? calcium-vitamin D (OSCAL WITH D) 500MG-200UNIT (5MCG) tablet Take 1 tablet by mouth 2 (two) times daily. 60 tablet 0  ? clotrimazole-betamethasone (LOTRISONE) cream Apply topically 2 (two) times daily. 30 g 0  ? dexamethasone (DECADRON) 4 MG tablet Start 2 days prior to infusion; Take for 2 days. Do not take on the day of infusion. 60 tablet 3  ? feeding supplement, ENSURE ENLIVE, (ENSURE ENLIVE) LIQD Take 237 mLs by mouth 2 (two) times daily between meals. 60 Bottle 0  ? furosemide (LASIX) 40  MG tablet Take 1 tablet (40 mg total) by mouth daily. 90 tablet 3  ? guaiFENesin-codeine 100-10 MG/5ML syrup Take 5 mLs by mouth every 12 (twelve) hours as needed for cough. May cause drowsiness. Do not drive with this medication. 120 mL 0  ? HYDROcodone-acetaminophen (NORCO) 5-325 MG tablet Take 1 tablet by mouth every 6 (six) hours as needed for moderate  pain. 30 tablet 0  ? lenalidomide (REVLIMID) 15 MG capsule Take 1 capsule (15 mg total) by mouth daily. Take for 21 days, then hold for 7 days. Repeat every 28 days. 21 capsule 0  ? levothyroxine (SYNTHROID) 150

## 2021-11-27 ENCOUNTER — Encounter: Payer: Self-pay | Admitting: Medical

## 2021-11-27 ENCOUNTER — Ambulatory Visit (INDEPENDENT_AMBULATORY_CARE_PROVIDER_SITE_OTHER): Payer: Medicare Other | Admitting: Medical

## 2021-11-27 VITALS — BP 150/90 | HR 42 | Ht 62.0 in | Wt 181.0 lb

## 2021-11-27 DIAGNOSIS — I10 Essential (primary) hypertension: Secondary | ICD-10-CM | POA: Diagnosis not present

## 2021-11-27 DIAGNOSIS — J449 Chronic obstructive pulmonary disease, unspecified: Secondary | ICD-10-CM | POA: Diagnosis not present

## 2021-11-27 DIAGNOSIS — C9000 Multiple myeloma not having achieved remission: Secondary | ICD-10-CM

## 2021-11-27 DIAGNOSIS — I5032 Chronic diastolic (congestive) heart failure: Secondary | ICD-10-CM

## 2021-11-27 DIAGNOSIS — R001 Bradycardia, unspecified: Secondary | ICD-10-CM

## 2021-11-27 MED ORDER — CARVEDILOL 3.125 MG PO TABS: 3.1250 mg | ORAL_TABLET | Freq: Two times a day (BID) | ORAL | 5 refills | Status: DC

## 2021-11-27 NOTE — Progress Notes (Signed)
?Cardiology Office Note:   ? ?Date:  11/27/2021  ? ?ID:  Brandi [name], DOB Mar 17, 1951, MRN [id number] ? ?PCP:  Marguerita Merles, MD  ?[facility] Cardiologist:  Nelva Bush, MD  ?[facility] Electrophysiologist:  None  ? ?Referring MD: Marguerita Merles, MD  ? ?Chief Complaint: 1-2 month follow-up ? ?History of Present Illness:   ? ?Brandi [name] is a 71 y.o. female with a hx of  multiple myeloma diagnosed 2018 s/p chemotherapy and autologous stem cell transplant 05/2018 maintenance Revlimid therapy, hypertension, lower extremity edema, COPD, tobacco use, and dyspnea who presents today for follow-up. ?  ?She was seen by Dr. Rogue Bussing 08/2019 and Revlimid was held in the setting of lower extremity edema.  TTE 07/2019 with preserved LVEF and normal diastolic function.  PASP at least moderately elevated.  During previous myeloma treatment, she had IJ DVT treated with Xarelto.  She was seen by Dr. Saunders Revel 09/26/2019 with 2 to 3 months worsening lower extremity edema and dyspnea, consistently NYHA class IIIb heart failure. She has been seen regularly in clinic for escalation of GDMT. She is now s/p thyroid removal with improvement in her sx associated with her goiter and abnormal thyroid.  At her last office visit, it was noted she had self reduced her diuresis to every other day.  Her diuresis was transiently increased for 3 days for volume overload.  She was instructed to return to daily Lasix at after that time.  CHF education was provided. ?  ?Last seen 05/15/21 and was taking lasix 3-4 times a month. Weight was up 139>170lbs. She was started on lasix 55m daily, Toprol, potassium and spironolactone.  ?  ?Last seen 10/02/21 and HR was low, and she felt tired and weak. Toprol was decreased to 265mdaily.  ? ?Today, heart rates is 42. She feels normal. Energy is good. She has been on steroids daily so her weight is up, also has some LLE. No chest pain, SOB, orthopnea, pnd. BP is also up today.  ? ?Past Medical  History:  ?Diagnosis Date  ? (HFpEF) heart failure with preserved ejection fraction ([place]  ? a. 06/2020 Echo: EF 60-65%, no rwma, nl RV size/fxn. Triv MR. Triv TR/AI. Mod elev PASP.  ? Anxiety   ? COPD (chronic obstructive pulmonary disease) ([place]  ? GERD (gastroesophageal reflux disease)   ? Hypertension   ? Hyperthyroidism   ? Hypokalemia   ? IgA myeloma ([place]CMer[place]  ? Multiple myeloma ([place]CM[place]  ? Pneumonia   ? Red blood cell antibody positive, compatible PRBC difficult to obtain   ? S/P autologous bone marrow transplantation ([place]CR[place]  ? ? ?Past Surgical History:  ?Procedure Laterality Date  ? ANTERIOR VITRECTOMY Left 10/07/2021  ? Procedure: ANTERIOR VITRECTOMY;  Surgeon: BrLeandrew KoyanagiMD;  [place] Service: Ophthalmology;  Laterality: Left;  ? CATARACT EXTRACTION W/PHACO Left 10/07/2021  ? Procedure: CATARACT EXTRACTION PHACO AND INTRAOCULAR LENS PLACEMENT ([place] AcresLEFT VISION BLUE;  Surgeon: BrLeandrew KoyanagiMD;  [place] Service: Ophthalmology;  Laterality: Left;  kit for manual incision ?14.56 ?01:21.4  ? CATARACT EXTRACTION W/PHACO Right 10/21/2021  ? Procedure: CATARACT EXTRACTION PHACO AND INTRAOCULAR LENS PLACEMENT (IOC) RIGHT 11.12 01:48.1;  Surgeon: BrLeandrew KoyanagiMD;  [place] Service: Ophthalmology;  Laterality: Right;  ? ESOPHAGOGASTRODUODENOSCOPY (EGD) WITH PROPOFOL N/A 03/30/2019  ? Procedure: ESOPHAGOGASTRODUODENOSCOPY (EGD) WITH PROPOFOL;  Surgeon: AnJonathon BellowsMD;  [facility];  Service: Gastroenterology;  Laterality: N/A;  ? IR FLUORO GUIDE PORT INSERTION RIGHT  12/16/2017  ? IR IMAGING GUIDED PORT INSERTION  06/15/2021  ? THYROIDECTOMY N/A   ? ? ?Current Medications: ?Current Meds  ?Medication Sig  ? acyclovir (ZOVIRAX) 400 MG tablet Take 1 tablet (400 mg total) by mouth 2 (two) times daily.  ? albuterol (VENTOLIN HFA) 108 (90 Base) MCG/ACT inhaler Inhale 2 puffs into the lungs every 6 (six) hours as  needed for wheezing or shortness of breath.  ? aspirin EC 81 MG EC tablet Take 1 tablet (81 mg total) by mouth daily.  ? buPROPion (WELLBUTRIN) 75 MG tablet Take 75 mg by mouth daily.  ? calcium-vitamin D (OSCAL WITH D) 500MG-200UNIT (5MCG) tablet Take 1 tablet by mouth 2 (two) times daily.  ? carvedilol (COREG) 3.125 MG tablet Take 1 tablet (3.125 mg total) by mouth 2 (two) times daily with a meal.  ? clotrimazole-betamethasone (LOTRISONE) cream Apply topically 2 (two) times daily.  ? dexamethasone (DECADRON) 4 MG tablet Start 2 days prior to infusion; Take for 2 days. Do not take on the day of infusion.  ? Ensure Plus (ENSURE PLUS) LIQD Take 237 mLs by mouth 3 (three) times a week.  ? furosemide (LASIX) 40 MG tablet Take 1 tablet (40 mg total) by mouth daily.  ? guaiFENesin-codeine 100-10 MG/5ML syrup Take 5 mLs by mouth every 12 (twelve) hours as needed for cough. May cause drowsiness. Do not drive with this medication.  ? HYDROcodone-acetaminophen (NORCO) 5-325 MG tablet Take 1 tablet by mouth every 6 (six) hours as needed for moderate pain.  ? lenalidomide (REVLIMID) 15 MG capsule Take 1 capsule (15 mg total) by mouth daily. Take for 21 days, then hold for 7 days. Repeat every 28 days.  ? levothyroxine (SYNTHROID) 150 MCG tablet Take 1 tablet (150 mcg total) by mouth daily before breakfast.  ? lidocaine-prilocaine (EMLA) cream Apply 1 application topically as needed. Apply to port and cover with saran wrap 1-2 hours prior to port access  ? magnesium oxide (MAG-OX) 400 (240 Mg) MG tablet Take 1 tablet (400 mg total) by mouth 2 (two) times daily.  ? montelukast (SINGULAIR) 10 MG tablet Take 1 tablet (10 mg total) by mouth at bedtime.  ? ondansetron (ZOFRAN) 8 MG tablet One pill every 8 hours as needed for nausea/vomitting.  ? pantoprazole (PROTONIX) 40 MG tablet Take 1 tablet by mouth once daily  ? polyethylene glycol (MIRALAX / GLYCOLAX) packet Take 17 g by mouth daily as needed for mild constipation.  ?  potassium chloride SA (KLOR-CON) 20 MEQ tablet Take 1 tablet (20 mEq total) by mouth 2 (two) times daily.  ? spironolactone (ALDACTONE) 25 MG tablet Take 1 tablet (25 mg total) by mouth daily.  ? traMADol (ULTRAM) 50 MG tablet Take 1 tablet (50 mg total) by mouth every 12 (twelve) hours as needed.  ? Vitamin D, Ergocalciferol, (DRISDOL) 1.25 MG (50000 UNIT) CAPS capsule Take 1 capsule by mouth once a week  ? [DISCONTINUED] metoprolol succinate (TOPROL-XL) 25 MG 24 hr tablet Take 1 tablet (25 mg total) by mouth daily.  ?  ? ?Allergies:   Patient has no known allergies.  ? ?Social History  ? ?Socioeconomic History  ? Marital status: Single  ?  Spouse name: Not on file  ? Number of children: 3  ? Years of education: Not on file  ? Highest education level: Not on file  ?Occupational History  ? Not on file  ?Tobacco Use  ?  Smoking status: Former  ?  Packs/day: 0.25  ?  Types: Cigarettes  ?  Quit date: 03/18/2021  ?  Years since quitting: 0.6  ? Smokeless tobacco: Never  ? Tobacco comments:  ?  2 cigarettes QOD  ?Vaping Use  ? Vaping Use: Never used  ?Substance and Sexual Activity  ? Alcohol use: No  ? Drug use: No  ? Sexual activity: Not on file  ?Other Topics Concern  ? Not on file  ?Social History Narrative  ? Lives at home with family(son): daughter 8 blocks away. States "I'm never alone." Independent at baseline  ? ?Social Determinants of Health  ? ?Financial Resource Strain: Not on file  ?Food Insecurity: Not on file  ?Transportation Needs: Not on file  ?Physical Activity: Not on file  ?Stress: Not on file  ?Social Connections: Not on file  ?  ? ?Family History: ?The patient's family history includes Pneumonia in her mother; Seizures in her father. ? ?ROS:   ?Please see the history of present illness.    ? All other systems reviewed and are negative. ? ?EKGs/Labs/Other Studies Reviewed:   ? ?The following studies were reviewed today: ? ?  ?Echo 08/20/19 ?1. Left ventricular ejection fraction, by visual estimation, is  60 to  ?65%. The left ventricle has normal function. There is no left ventricular  ?hypertrophy.  ? 2. The left ventricle has no regional wall motion abnormalities.  ? 3. Global right ventricle has normal systolic fun

## 2021-11-27 NOTE — Patient Instructions (Signed)
Medication Instructions:  ?Your physician has recommended you make the following change in your medication:  ? ?STOP Metoprolol ? ?START Carvedilol 3.125 mg twice a day. An Rx has been sent to your pharmacy. ? ?*If you need a refill on your cardiac medications before your next appointment, please call your pharmacy* ? ? ?Lab Work: ?None ordered ?If you have labs (blood work) drawn today and your tests are completely normal, you will receive your results only by: ?MyChart Message (if you have MyChart) OR ?A paper copy in the mail ?If you have any lab test that is abnormal or we need to change your treatment, we will call you to review the results. ? ? ?Testing/Procedures: ?None ordered ? ? ?Follow-Up: ?At [facility], you and your health needs are our priority.  As part of our continuing mission to provide you with exceptional heart care, we have created designated Provider Care Teams.  These Care Teams include your primary Cardiologist (physician) and Advanced Practice Providers (APPs -  Physician Assistants and Nurse Practitioners) who all work together to provide you with the care you need, when you need it. ? ?We recommend signing up for the patient portal called "MyChart".  Sign up information is provided on this After Visit Summary.  MyChart is used to connect with patients for Virtual Visits (Telemedicine).  Patients are able to view lab/test results, encounter notes, upcoming appointments, etc.  Non-urgent messages can be sent to your provider as well.   ?To learn more about what you can do with MyChart, go to [email or web address].   ? ?Your next appointment:   ?4 week(s) ? ?The format for your next appointment:   ?In Person ? ?Provider:   ?You may see Nelva Bush, MD or one of the following Advanced Practice Providers on your designated Care Team:   ?Murray Hodgkins, NP ?Christell Faith, PA-C ?Cadence Kathlen Mody, PA-C ? ? ? ?Other Instructions ?N/A ? ?Important Information About Sugar ? ? ? ? ? ? ?

## 2021-11-27 NOTE — Telephone Encounter (Signed)
LM at Dr. Larwance Rote office @ [phone] him to call Dr. B on his cell, # was provided. ?

## 2021-12-03 ENCOUNTER — Other Ambulatory Visit: Payer: Medicare Other

## 2021-12-03 ENCOUNTER — Inpatient Hospital Stay: Payer: Medicare Other

## 2021-12-03 ENCOUNTER — Ambulatory Visit: Payer: Medicare Other

## 2021-12-03 VITALS — BP 163/85 | HR 48 | Temp 96.7°F | Resp 18 | Ht 62.0 in | Wt 186.0 lb

## 2021-12-03 DIAGNOSIS — C9000 Multiple myeloma not having achieved remission: Secondary | ICD-10-CM

## 2021-12-03 DIAGNOSIS — Z5112 Encounter for antineoplastic immunotherapy: Secondary | ICD-10-CM | POA: Diagnosis not present

## 2021-12-03 DIAGNOSIS — Z95828 Presence of other vascular implants and grafts: Secondary | ICD-10-CM

## 2021-12-03 LAB — COMPREHENSIVE METABOLIC PANEL
ALT: 21 U/L (ref 0–44)
AST: 24 U/L (ref 15–41)
Albumin: 3.5 g/dL (ref 3.5–5.0)
Alkaline Phosphatase: 79 U/L (ref 38–126)
Anion gap: 7 (ref 5–15)
BUN: 21 mg/dL (ref 8–23)
CO2: 21 mmol/L — ABNORMAL LOW (ref 22–32)
Calcium: 7.9 mg/dL — ABNORMAL LOW (ref 8.9–10.3)
Chloride: 107 mmol/L (ref 98–111)
Creatinine, Ser: 0.83 mg/dL (ref 0.44–1.00)
GFR, Estimated: >60 mL/min (ref >60)
Glucose, Bld: 127 mg/dL — ABNORMAL HIGH (ref 70–99)
Potassium: 3.7 mmol/L (ref 3.5–5.1)
Sodium: 135 mmol/L (ref 135–145)
Total Bilirubin: 0.2 mg/dL — ABNORMAL LOW (ref 0.3–1.2)
Total Protein: 7.8 g/dL (ref 6.5–8.1)

## 2021-12-03 LAB — CBC WITH DIFFERENTIAL/PLATELET
Abs Immature Granulocytes: 0.05 10*3/uL (ref 0.00–0.07)
Basophils Absolute: 0 10*3/uL (ref 0.0–0.1)
Basophils Relative: 0 %
Eosinophils Absolute: 0 10*3/uL (ref 0.0–0.5)
Eosinophils Relative: 0 %
HCT: 29.6 % — ABNORMAL LOW (ref 36.0–46.0)
Hemoglobin: 9.6 g/dL — ABNORMAL LOW (ref 12.0–15.0)
Immature Granulocytes: 1 %
Lymphocytes Relative: 24 %
Lymphs Abs: 1.2 10*3/uL (ref 0.7–4.0)
MCH: 30.5 pg (ref 26.0–34.0)
MCHC: 32.4 g/dL (ref 30.0–36.0)
MCV: 94 fL (ref 80.0–100.0)
Monocytes Absolute: 0.2 10*3/uL (ref 0.1–1.0)
Monocytes Relative: 3 %
Neutro Abs: 3.6 10*3/uL (ref 1.7–7.7)
Neutrophils Relative %: 72 %
Platelets: 197 10*3/uL (ref 150–400)
RBC: 3.15 MIL/uL — ABNORMAL LOW (ref 3.87–5.11)
RDW: 16.7 % — ABNORMAL HIGH (ref 11.5–15.5)
WBC: 5 10*3/uL (ref 4.0–10.5)
nRBC: 0 % (ref 0.0–0.2)

## 2021-12-03 MED ORDER — DIPHENHYDRAMINE HCL 25 MG PO CAPS
50.0000 mg | ORAL_CAPSULE | Freq: Once | ORAL | Status: AC
  Administered 2021-12-03: 50 mg via ORAL
  Filled 2021-12-03: qty 2

## 2021-12-03 MED ORDER — HEPARIN SOD (PORK) LOCK FLUSH 100 UNIT/ML IV SOLN
500.0000 [IU] | Freq: Once | INTRAVENOUS | Status: AC
  Administered 2021-12-03: 500 [IU] via INTRAVENOUS
  Filled 2021-12-03: qty 5

## 2021-12-03 MED ORDER — DEXAMETHASONE 4 MG PO TABS
20.0000 mg | ORAL_TABLET | Freq: Once | ORAL | Status: AC
  Administered 2021-12-03: 20 mg via ORAL
  Filled 2021-12-03: qty 5

## 2021-12-03 MED ORDER — DARATUMUMAB-HYALURONIDASE-FIHJ 1800-30000 MG-UT/15ML ~~LOC~~ SOLN
1800.0000 mg | Freq: Once | SUBCUTANEOUS | Status: AC
  Administered 2021-12-03: 1800 mg via SUBCUTANEOUS
  Filled 2021-12-03: qty 15

## 2021-12-03 MED ORDER — ACETAMINOPHEN 325 MG PO TABS
650.0000 mg | ORAL_TABLET | Freq: Once | ORAL | Status: AC
  Administered 2021-12-03: 650 mg via ORAL
  Filled 2021-12-03: qty 2

## 2021-12-03 NOTE — Patient Instructions (Signed)
[facility] CANCER CTR AT [place]  Discharge Instructions: ?Thank you for choosing [facility] to provide your oncology and hematology care.  ?If you have a lab appointment with the [facility], please go directly to the [facility] and check in at the registration area. ? ?Wear comfortable clothing and clothing appropriate for easy access to any Portacath or PICC line.  ? ?We strive to give you quality time with your provider. You may need to reschedule your appointment if you arrive late (15 or more minutes).  Arriving late affects you and other patients whose appointments are after yours.  Also, if you miss three or more appointments without notifying the office, you may be dismissed from the clinic at the provider?s discretion.    ?  ?For prescription refill requests, have your pharmacy contact our office and allow 72 hours for refills to be completed.   ? ?Today you received the following chemotherapy and/or immunotherapy agents DARZALEX ?    ?  ?To help prevent nausea and vomiting after your treatment, we encourage you to take your nausea medication as directed. ? ?BELOW ARE SYMPTOMS THAT SHOULD BE REPORTED IMMEDIATELY: ?*FEVER GREATER THAN 100.4 F (38 ?C) OR HIGHER ?*CHILLS OR SWEATING ?*NAUSEA AND VOMITING THAT IS NOT CONTROLLED WITH YOUR NAUSEA MEDICATION ?*UNUSUAL SHORTNESS OF BREATH ?*UNUSUAL BRUISING OR BLEEDING ?*URINARY PROBLEMS (pain or burning when urinating, or frequent urination) ?*BOWEL PROBLEMS (unusual diarrhea, constipation, pain near the anus) ?TENDERNESS IN MOUTH AND THROAT WITH OR WITHOUT PRESENCE OF ULCERS (sore throat, sores in mouth, or a toothache) ?UNUSUAL RASH, SWELLING OR PAIN  ?UNUSUAL VAGINAL DISCHARGE OR ITCHING  ? ?Items with * indicate a potential emergency and should be followed up as soon as possible or go to the Emergency Department if any problems should occur. ? ?Please show the CHEMOTHERAPY ALERT CARD or IMMUNOTHERAPY ALERT CARD at check-in to  the Emergency Department and triage nurse. ? ?Should you have questions after your visit or need to cancel or reschedule your appointment, please contact [facility] CANCER [place] AT [place]  [phone] and follow the prompts.  Office hours are 8:00 a.m. to 4:30 p.m. Monday - Friday. Please note that voicemails left after 4:00 p.m. may not be returned until the following business day.  We are closed weekends and major holidays. You have access to a nurse at all times for urgent questions. Please call the main number to the clinic [phone] and follow the prompts. ? ?For any non-urgent questions, you may also contact your provider using MyChart. We now offer e-Visits for anyone 47 and older to request care online for non-urgent symptoms. For details visit [email or web address]. ?  ?Also download the MyChart app! Go to the app store, search "MyChart", open the app, select [place], and log in with your MyChart username and password. ? ?Due to Covid, a mask is required upon entering the hospital/clinic. If you do not have a mask, one will be given to you upon arrival. For doctor visits, patients may have 1 support person aged 26 or older with them. For treatment visits, patients cannot have anyone with them due to current Covid guidelines and our immunocompromised population.  ? ?Daratumumab injection ?What is this medication? ?DARATUMUMAB (dar a toom ue mab) is a monoclonal antibody. It is used to treat multiple myeloma. ?This medicine may be used for other purposes; ask your health care provider or pharmacist if you have questions. ?COMMON BRAND NAME(S): DARZALEX ?What should I tell my care team before  I take this medication? ?They need to know if you have any of these conditions: ?hereditary fructose intolerance ?infection (especially a virus infection such as chickenpox, herpes, or hepatitis B virus) ?lung or breathing disease (asthma, COPD) ?an unusual or allergic reaction to daratumumab,  sorbitol, other medicines, foods, dyes, or preservatives ?pregnant or trying to get pregnant ?breast-feeding ?How should I use this medication? ?This medicine is for infusion into a vein. It is given by a health care professional in a hospital or clinic setting. ?Talk to your pediatrician regarding the use of this medicine in children. Special care may be needed. ?Overdosage: If you think you have taken too much of this medicine contact a poison control center or emergency room at once. ?NOTE: This medicine is only for you. Do not share this medicine with others. ?What if I miss a dose? ?Keep appointments for follow-up doses as directed. It is important not to miss your dose. Call your doctor or health care professional if you are unable to keep an appointment. ?What may interact with this medication? ?Interactions have not been studied. ?This list may not describe all possible interactions. Give your health care provider a list of all the medicines, herbs, non-prescription drugs, or dietary supplements you use. Also tell them if you smoke, drink alcohol, or use illegal drugs. Some items may interact with your medicine. ?What should I watch for while using this medication? ?Your condition will be monitored carefully while you are receiving this medicine. ?This medicine can cause serious allergic reactions. To reduce your risk, your health care provider may give you other medicine to take before receiving this one. Be sure to follow the directions from your health care provider. ?This medicine can affect the results of blood tests to match your blood type. These changes can last for up to 6 months after the final dose. Your healthcare provider will do blood tests to match your blood type before you start treatment. Tell all of your healthcare providers that you are being treated with this medicine before receiving a blood transfusion. ?This medicine can affect the results of some tests used to determine treatment  response; extra tests may be needed to evaluate response. ?Do not become pregnant while taking this medicine or for 3 months after stopping it. Women should inform their health care provider if they wish to become pregnant or think they might be pregnant. There is a potential for serious side effects to an unborn child. Talk to your health care provider for more information. Do not breast-feed an infant while taking this medicine. ?What side effects may I notice from receiving this medication? ?Side effects that you should report to your care team as soon as possible: ?Allergic reactions--skin rash, itching or hives, swelling of the face, lips, or tongue ?Blurred vision ?Infection--fever, chills, cough, sore throat, pain or trouble passing urine ?Infusion reactions--dizziness, fast heartbeat, feeling faint or lightheaded, falls, headache, increase in blood pressure, nausea, vomiting, or wheezing or trouble breathing with loud or whistling sounds ?Unusual bleeding or bruising ?Side effects that usually do not require medical attention (report to your care team if they continue or are bothersome): ?Constipation ?Diarrhea ?Pain, tingling, numbness in the hands or feet ?Swelling of the ankles, feet, hands ?Tiredness ?This list may not describe all possible side effects. Call your doctor for medical advice about side effects. You may report side effects to FDA at 1-800-FDA-1088. ?Where should I keep my medication? ?This drug is given in a hospital or clinic and will  not be stored at home. ?NOTE: This sheet is a summary. It may not cover all possible information. If you have questions about this medicine, talk to your doctor, pharmacist, or health care provider. ?? 2023 Elsevier/Gold Standard (2020-12-18 00:00:00) ? ?

## 2021-12-07 ENCOUNTER — Emergency Department: Payer: Medicare Other

## 2021-12-07 ENCOUNTER — Emergency Department
Admission: EM | Admit: 2021-12-07 | Discharge: 2021-12-07 | Disposition: A | Discharge status: Home / Self Care | Payer: Medicare Other | Attending: Emergency Medicine | Admitting: Emergency Medicine

## 2021-12-07 ENCOUNTER — Encounter: Payer: Self-pay | Admitting: Emergency Medicine

## 2021-12-07 ENCOUNTER — Other Ambulatory Visit: Payer: Self-pay

## 2021-12-07 DIAGNOSIS — Z20822 Contact with and (suspected) exposure to covid-19: Secondary | ICD-10-CM | POA: Diagnosis not present

## 2021-12-07 DIAGNOSIS — I509 Heart failure, unspecified: Secondary | ICD-10-CM | POA: Insufficient documentation

## 2021-12-07 DIAGNOSIS — J441 Chronic obstructive pulmonary disease with (acute) exacerbation: Secondary | ICD-10-CM | POA: Insufficient documentation

## 2021-12-07 DIAGNOSIS — R059 Cough, unspecified: Secondary | ICD-10-CM | POA: Diagnosis present

## 2021-12-07 DIAGNOSIS — R051 Acute cough: Secondary | ICD-10-CM

## 2021-12-07 LAB — TROPONIN I (HIGH SENSITIVITY): Troponin I (High Sensitivity): 8 ng/L (ref <18)

## 2021-12-07 LAB — RESP PANEL BY RT-PCR (FLU A&B, COVID) ARPGX2
Influenza A by PCR: NEGATIVE
Influenza B by PCR: NEGATIVE
SARS Coronavirus 2 by RT PCR: NEGATIVE

## 2021-12-07 LAB — CBC WITH DIFFERENTIAL/PLATELET
Abs Immature Granulocytes: 0.02 10*3/uL (ref 0.00–0.07)
Basophils Absolute: 0 10*3/uL (ref 0.0–0.1)
Basophils Relative: 1 %
Eosinophils Absolute: 0.2 10*3/uL (ref 0.0–0.5)
Eosinophils Relative: 3 %
HCT: 35.8 % — ABNORMAL LOW (ref 36.0–46.0)
Hemoglobin: 11.5 g/dL — ABNORMAL LOW (ref 12.0–15.0)
Immature Granulocytes: 0 %
Lymphocytes Relative: 37 %
Lymphs Abs: 2.2 10*3/uL (ref 0.7–4.0)
MCH: 29.9 pg (ref 26.0–34.0)
MCHC: 32.1 g/dL (ref 30.0–36.0)
MCV: 93 fL (ref 80.0–100.0)
Monocytes Absolute: 0.3 10*3/uL (ref 0.1–1.0)
Monocytes Relative: 5 %
Neutro Abs: 3.3 10*3/uL (ref 1.7–7.7)
Neutrophils Relative %: 54 %
Platelets: 234 10*3/uL (ref 150–400)
RBC: 3.85 MIL/uL — ABNORMAL LOW (ref 3.87–5.11)
RDW: 16.5 % — ABNORMAL HIGH (ref 11.5–15.5)
WBC: 5.9 10*3/uL (ref 4.0–10.5)
nRBC: 0 % (ref 0.0–0.2)

## 2021-12-07 LAB — BASIC METABOLIC PANEL
Anion gap: 13 (ref 5–15)
BUN: 18 mg/dL (ref 8–23)
CO2: 22 mmol/L (ref 22–32)
Calcium: 8 mg/dL — ABNORMAL LOW (ref 8.9–10.3)
Chloride: 101 mmol/L (ref 98–111)
Creatinine, Ser: 1.1 mg/dL — ABNORMAL HIGH (ref 0.44–1.00)
GFR, Estimated: 54 mL/min — ABNORMAL LOW (ref >60)
Glucose, Bld: 119 mg/dL — ABNORMAL HIGH (ref 70–99)
Potassium: 2.8 mmol/L — ABNORMAL LOW (ref 3.5–5.1)
Sodium: 136 mmol/L (ref 135–145)

## 2021-12-07 MED ORDER — IPRATROPIUM-ALBUTEROL 0.5-2.5 (3) MG/3ML IN SOLN
3.0000 mL | Freq: Once | RESPIRATORY_TRACT | Status: AC
  Administered 2021-12-07: 3 mL via RESPIRATORY_TRACT
  Filled 2021-12-07: qty 3

## 2021-12-07 MED ORDER — PREDNISONE 50 MG PO TABS: 50.0000 mg | ORAL_TABLET | Freq: Every day | ORAL | 0 refills | Status: AC

## 2021-12-07 MED ORDER — ACETAMINOPHEN 500 MG PO TABS
1000.0000 mg | ORAL_TABLET | Freq: Once | ORAL | Status: AC
  Administered 2021-12-07: 1000 mg via ORAL
  Filled 2021-12-07: qty 2

## 2021-12-07 MED ORDER — PREDNISONE 20 MG PO TABS
60.0000 mg | ORAL_TABLET | Freq: Once | ORAL | Status: AC
  Administered 2021-12-07: 60 mg via ORAL
  Filled 2021-12-07: qty 3

## 2021-12-07 NOTE — Discharge Instructions (Signed)
GUPlease use your breathing treatments at home to help with her cough. ? ?Course of steroids/prednisone.  We gave you a dose already for today, please pick up this prescription and start taking it tomorrow. ?

## 2021-12-07 NOTE — ED Triage Notes (Signed)
Pt to ED via POV c/o cough and congestion. Pt states that this has been going on for about 1 week. Pt is currently in NAD.  ?

## 2021-12-07 NOTE — ED Provider Notes (Signed)
? ?[facility] ?Provider Note ? ? ? Event Date/Time  ? First MD Initiated Contact with Patient 12/07/21 1135   ?  (approximate) ? ? ?History  ? ?Cough ? ? ?HPI ? ?Brandi [name] is a 71 y.o. female who presents to the ED for evaluation of Cough ?  ?I reviewed cardiology clinic visit from 10 days ago.  History of multiple myeloma s/p chemo and autologous stem cell transplantation.  COPD and dCHF. ? ?Patient presents to the ED for evaluation of 1 week of a minimally productive cough.  She reports she has not been using her breathing treatments at home because she did not think it was that bad.  She reports no fevers, syncope, abdominal pain or emesis.  Denies chest pain. ? ? ?Physical Exam  ? ?Triage Vital Signs: ?ED Triage Vitals  ?Enc Vitals Group  ?   BP 12/07/21 1014 (!) 138/92  ?   Pulse Rate 12/07/21 1014 98  ?   Resp 12/07/21 1014 18  ?   Temp 12/07/21 1014 99 ?F (37.2 ?C)  ?   Temp Source 12/07/21 1014 Oral  ?   SpO2 12/07/21 1014 94 %  ?   Weight 12/07/21 1011 180 lb (81.6 kg)  ?   Height 12/07/21 1011 '5\' 2"'  (1.575 m)  ?   Head Circumference --   ?   Peak Flow --   ?   Pain Score 12/07/21 1011 0  ?   Pain Loc --   ?   Pain Edu? --   ?   Excl. in [place]? --   ? ? ?Most recent vital signs: ?Vitals:  ? 12/07/21 1014 12/07/21 1338  ?BP: (!) 138/92 140/88  ?Pulse: 98 88  ?Resp: 18 18  ?Temp: 99 ?F (37.2 ?C)   ?SpO2: 94% 97%  ? ? ?General: Awake, no distress.  Well-appearing, pleasant and conversational in full sentences. ?CV:  Good peripheral perfusion.  ?Resp:  Normal effort.  Diffuse and scattered expiratory wheezes ?Abd:  No distention.  ?MSK:  No deformity noted.  ?Neuro:  No focal deficits appreciated. ?Other:   ? ? ?ED Results / Procedures / Treatments  ? ?Labs ?(all labs ordered are listed, but only abnormal results are displayed) ?Labs Reviewed  ?CBC WITH DIFFERENTIAL/PLATELET - Abnormal; Notable for the following components:  ?    Result Value  ? RBC 3.85 (*)   ? Hemoglobin 11.5 (*)    ? HCT 35.8 (*)   ? RDW 16.5 (*)   ? All other components within normal limits  ?BASIC METABOLIC PANEL - Abnormal; Notable for the following components:  ? Potassium 2.8 (*)   ? Glucose, Bld 119 (*)   ? Creatinine, Ser 1.10 (*)   ? Calcium 8.0 (*)   ? GFR, Estimated 54 (*)   ? All other components within normal limits  ?RESP PANEL BY RT-PCR (FLU A&B, COVID) [id number]  ?TROPONIN I (HIGH SENSITIVITY)  ?TROPONIN I (HIGH SENSITIVITY)  ? ? ?EKG ?Sinus rhythm, rate of 89 bpm.  Normal axis.  QTc 476.  No STEMI.  Tremulous baseline makes fine detail difficult to interpret. ? ?RADIOLOGY ?CXR reviewed by me without evidence of acute cardiopulmonary pathology. ? ?Official radiology report(s): ?DG Chest 2 View ? ?Result Date: 12/07/2021 ?CLINICAL DATA:  Provided history: Cough.  Cough and congestion. EXAM: CHEST - 2 VIEW COMPARISON:  Prior chest radiographs 09/13/2021 and earlier. CT chest 05/24/2021. FINDINGS: Right chest infusion port catheter with tip projecting at the  level of the superior cavoatrial junction. Heart size within normal limits. Aortic atherosclerosis. Redemonstrated chronic peribronchial thickening suggesting chronic bronchitis. Minimal atelectasis within the left lung base. No appreciable airspace consolidation or pulmonary edema. No evidence of pleural effusion or pneumothorax. No acute bony abnormality identified. IMPRESSION: No appreciable airspace consolidation or pulmonary edema. Minimal atelectasis within the left lung base. Chronic peribronchial thickening suggesting chronic bronchitis. Aortic Atherosclerosis (ICD10-I70.0). Electronically Signed   By: Kellie Simmering D.O.   On: 12/07/2021 11:21   ? ?PROCEDURES and INTERVENTIONS: ? ?.1-3 Lead EKG Interpretation ?Performed by: Vladimir Crofts, MD ?Authorized by: Vladimir Crofts, MD  ? ?  Interpretation: normal   ?  ECG rate:  90 ?  ECG rate assessment: normal   ?  Rhythm: sinus rhythm   ?  Ectopy: none   ?  Conduction: normal   ? ?Medications  ?ipratropium-albuterol  (DUONEB) 0.5-2.5 (3) MG/3ML nebulizer solution 3 mL (3 mLs Nebulization Given 12/07/21 1239)  ?predniSONE (DELTASONE) tablet 60 mg (60 mg Oral Given 12/07/21 1238)  ?acetaminophen (TYLENOL) tablet 1,000 mg (1,000 mg Oral Given 12/07/21 1239)  ? ? ? ?IMPRESSION / MDM / ASSESSMENT AND PLAN / ED COURSE  ?I reviewed the triage vital signs and the nursing notes. ? ?71 year old female with history of COPD presents to the ED with minimally productive cough, with evidence of a COPD exacerbation.  Looks systemically well without distress, hypoxia or instability.  Wheezing on examination, but no evidence of volume overload.  Blood work with hypokalemia that is repleted orally.  No evidence of leukocytosis, sepsis or systemic illness.  CXR is clear without infiltrate or PTX.  We will provide breathing treatments and start her on steroids.  Anticipate outpatient management. ? ?Clinical Course as of 12/07/21 1553  ?Mon Dec 07, 2021  ?1325 Reassessed.  Feeling better.  We discussed using her nebulizer at home and return precautions for the ED. [DS]  ?  ?Clinical Course User Index ?[DS] Vladimir Crofts, MD  ? ? ? ?FINAL CLINICAL IMPRESSION(S) / ED DIAGNOSES  ? ?Final diagnoses:  ?Acute cough  ?COPD exacerbation ([place])  ? ? ? ?Rx / DC Orders  ? ?ED Discharge Orders   ? ?      Ordered  ?  predniSONE (DELTASONE) 50 MG tablet  Daily       ? 12/07/21 1249  ? ?  ?  ? ?  ? ? ? ?Note:  This document was prepared using Dragon voice recognition software and may include unintentional dictation errors. ?  ?Vladimir Crofts, MD ?12/07/21 1553 ? ?

## 2021-12-10 ENCOUNTER — Inpatient Hospital Stay: Payer: Medicare Other

## 2021-12-10 ENCOUNTER — Encounter: Payer: Self-pay | Admitting: Internal Medicine

## 2021-12-10 ENCOUNTER — Inpatient Hospital Stay (HOSPITAL_BASED_OUTPATIENT_CLINIC_OR_DEPARTMENT_OTHER): Payer: Medicare Other | Admitting: Internal Medicine

## 2021-12-10 VITALS — BP 159/83 | HR 48

## 2021-12-10 DIAGNOSIS — Z5112 Encounter for antineoplastic immunotherapy: Secondary | ICD-10-CM | POA: Diagnosis not present

## 2021-12-10 DIAGNOSIS — C9000 Multiple myeloma not having achieved remission: Secondary | ICD-10-CM

## 2021-12-10 DIAGNOSIS — J209 Acute bronchitis, unspecified: Secondary | ICD-10-CM

## 2021-12-10 LAB — CBC WITH DIFFERENTIAL/PLATELET
Abs Immature Granulocytes: 0.09 10*3/uL — ABNORMAL HIGH (ref 0.00–0.07)
Basophils Absolute: 0 10*3/uL (ref 0.0–0.1)
Basophils Relative: 0 %
Eosinophils Absolute: 0 10*3/uL (ref 0.0–0.5)
Eosinophils Relative: 0 %
HCT: 30.2 % — ABNORMAL LOW (ref 36.0–46.0)
Hemoglobin: 10 g/dL — ABNORMAL LOW (ref 12.0–15.0)
Immature Granulocytes: 1 %
Lymphocytes Relative: 13 %
Lymphs Abs: 1.6 10*3/uL (ref 0.7–4.0)
MCH: 30.5 pg (ref 26.0–34.0)
MCHC: 33.1 g/dL (ref 30.0–36.0)
MCV: 92.1 fL (ref 80.0–100.0)
Monocytes Absolute: 0.5 10*3/uL (ref 0.1–1.0)
Monocytes Relative: 4 %
Neutro Abs: 9.7 10*3/uL — ABNORMAL HIGH (ref 1.7–7.7)
Neutrophils Relative %: 82 %
Platelets: 246 10*3/uL (ref 150–400)
RBC: 3.28 MIL/uL — ABNORMAL LOW (ref 3.87–5.11)
RDW: 16.5 % — ABNORMAL HIGH (ref 11.5–15.5)
WBC: 11.8 10*3/uL — ABNORMAL HIGH (ref 4.0–10.5)
nRBC: 0 % (ref 0.0–0.2)

## 2021-12-10 LAB — COMPREHENSIVE METABOLIC PANEL
ALT: 14 U/L (ref 0–44)
AST: 17 U/L (ref 15–41)
Albumin: 3.3 g/dL — ABNORMAL LOW (ref 3.5–5.0)
Alkaline Phosphatase: 70 U/L (ref 38–126)
Anion gap: 11 (ref 5–15)
BUN: 21 mg/dL (ref 8–23)
CO2: 23 mmol/L (ref 22–32)
Calcium: 8.3 mg/dL — ABNORMAL LOW (ref 8.9–10.3)
Chloride: 103 mmol/L (ref 98–111)
Creatinine, Ser: 0.87 mg/dL (ref 0.44–1.00)
GFR, Estimated: >60 mL/min (ref >60)
Glucose, Bld: 128 mg/dL — ABNORMAL HIGH (ref 70–99)
Potassium: 3.6 mmol/L (ref 3.5–5.1)
Sodium: 137 mmol/L (ref 135–145)
Total Bilirubin: 0.3 mg/dL (ref 0.3–1.2)
Total Protein: 7.8 g/dL (ref 6.5–8.1)

## 2021-12-10 MED ORDER — DIPHENHYDRAMINE HCL 25 MG PO CAPS
50.0000 mg | ORAL_CAPSULE | Freq: Once | ORAL | Status: AC
  Administered 2021-12-10: 50 mg via ORAL
  Filled 2021-12-10: qty 2

## 2021-12-10 MED ORDER — ALBUTEROL SULFATE HFA 108 (90 BASE) MCG/ACT IN AERS: 2.0000 | INHALATION_SPRAY | Freq: Four times a day (QID) | RESPIRATORY_TRACT | 3 refills | Status: DC | PRN

## 2021-12-10 MED ORDER — ACETAMINOPHEN 325 MG PO TABS
650.0000 mg | ORAL_TABLET | Freq: Once | ORAL | Status: AC
  Administered 2021-12-10: 650 mg via ORAL
  Filled 2021-12-10: qty 2

## 2021-12-10 MED ORDER — GUAIFENESIN-CODEINE 100-10 MG/5ML PO SOLN: 5.0000 mL | Freq: Two times a day (BID) | ORAL | 0 refills | Status: DC | PRN

## 2021-12-10 MED ORDER — DARATUMUMAB-HYALURONIDASE-FIHJ 1800-30000 MG-UT/15ML ~~LOC~~ SOLN
1800.0000 mg | Freq: Once | SUBCUTANEOUS | Status: AC
  Administered 2021-12-10: 1800 mg via SUBCUTANEOUS
  Filled 2021-12-10: qty 15

## 2021-12-10 MED ORDER — SODIUM CHLORIDE 0.9 % IV SOLN
20.0000 mg | Freq: Once | INTRAVENOUS | Status: AC
  Administered 2021-12-10: 20 mg via INTRAVENOUS
  Filled 2021-12-10: qty 2

## 2021-12-10 MED ORDER — DEXAMETHASONE 4 MG PO TABS: 20.0000 mg | ORAL_TABLET | Freq: Once | ORAL | Status: DC

## 2021-12-10 MED ORDER — MONTELUKAST SODIUM 10 MG PO TABS: 10.0000 mg | ORAL_TABLET | Freq: Every day | ORAL | 3 refills | Status: DC

## 2021-12-10 MED ORDER — SODIUM CHLORIDE 0.9% FLUSH
10.0000 mL | Freq: Once | INTRAVENOUS | Status: AC
  Administered 2021-12-10: 10 mL via INTRAVENOUS
  Filled 2021-12-10: qty 10

## 2021-12-10 MED ORDER — LEVOTHYROXINE SODIUM 100 MCG PO TABS: 100.0000 ug | ORAL_TABLET | Freq: Every day | ORAL | 3 refills | Status: DC

## 2021-12-10 MED ORDER — HEPARIN SOD (PORK) LOCK FLUSH 100 UNIT/ML IV SOLN
500.0000 [IU] | Freq: Once | INTRAVENOUS | Status: AC
  Administered 2021-12-10: 500 [IU] via INTRAVENOUS
  Filled 2021-12-10: qty 5

## 2021-12-10 NOTE — Assessment & Plan Note (Addendum)
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  Currently recurrent-on salvage therapy.   # Patient currently on salvage therapy with daratumumab  lenalidomide [15 mg 3 w; 1-w] dexamethasone.    #Continue cycle #2-day 15 Dara SQ weekly; continue REVLIMID 15 mg again. 3 weeks/1 week off; continue. Labs today reviewed;  acceptable for treatment today. April 2023- 1.2 gm; lamda LC= 230.  Awaiting repeat labs from today.  #Dry cough- ?  Chest x-ray negative for pneumonia.  Seasonal allergies/pollen-continue Singulair.  Continue inhalers; cough prescription sent.  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   # Muscle cramps-?  Revlimid. improved  On tramadol q 12 hours prn-STABLE.   # Chronic diastolic CHF-clinically stable  # Bone lesions /last zometa on 11/27/2017.  Holding Zometa secondary to poor dentition.  Awaiting to speak to dentistry regarding the patient dental extraction.  # IV access: mediport   :MM panle; K/l light chain ratio q 4 W  # DISPOSITION:  #  Dara today # 1 weeks- labs- cbc/cmp; dara IV #  Follow up on 6/08-- MD;port- labs-cbc/cmp;- Dara IV Dr.B

## 2021-12-10 NOTE — Patient Instructions (Signed)
[facility]CER [facility] AT [place]-MEDICAL ONCOLOGY  Discharge Instructions: °Thank you for choosing [facility] to provide your oncology and hematology care.  ° °If you have a lab appointment with the Cancer Center, please go directly to the Cancer Center and check in at the registration area. °  °Wear comfortable clothing and clothing appropriate for easy access to any Portacath or PICC line.  ° °We strive to give you quality time with your provider. You may need to reschedule your appointment if you arrive late (15 or more minutes).  Arriving late affects you and other patients whose appointments are after yours.  Also, if you miss three or more appointments without notifying the office, you may be dismissed from the clinic at the provider’s discretion.    °  °For prescription refill requests, have your pharmacy contact our office and allow 72 hours for refills to be completed.   ° °Today you received the following chemotherapy and/or immunotherapy agents     °  °To help prevent nausea and vomiting after your treatment, we encourage you to take your nausea medication as directed. ° °BELOW ARE SYMPTOMS THAT SHOULD BE REPORTED IMMEDIATELY: °*FEVER GREATER THAN 100.4 F (38 °C) OR HIGHER °*CHILLS OR SWEATING °*NAUSEA AND VOMITING THAT IS NOT CONTROLLED WITH YOUR NAUSEA MEDICATION °*UNUSUAL SHORTNESS OF BREATH °*UNUSUAL BRUISING OR BLEEDING °*URINARY PROBLEMS (pain or burning when urinating, or frequent urination) °*BOWEL PROBLEMS (unusual diarrhea, constipation, pain near the anus) °TENDERNESS IN MOUTH AND THROAT WITH OR WITHOUT PRESENCE OF ULCERS (sore throat, sores in mouth, or a toothache) °UNUSUAL RASH, SWELLING OR PAIN  °UNUSUAL VAGINAL DISCHARGE OR ITCHING  ° °Items with * indicate a potential emergency and should be followed up as soon as possible or go to the Emergency Department if any problems should occur. ° °Please show the CHEMOTHERAPY ALERT CARD or IMMUNOTHERAPY ALERT CARD at check-in to the  Emergency Department and triage nurse. ° °Should you have questions after your visit or need to cancel or reschedule your appointment, please contact [facility] AT [place]-MEDICAL ONCOLOGY  Dept: [phone]  and follow the prompts.  Office hours are 8:00 a.m. to 4:30 p.m. Monday - Friday. Please note that voicemails left after 4:00 p.m. may not be returned until the following business day.  We are closed weekends and major holidays. You have access to a nurse at all times for urgent questions. Please call the main number to the clinic Dept: [phone] and follow the prompts. ° ° °For any non-urgent questions, you may also contact your provider using MyChart. We now offer e-Visits for anyone 18 and older to request care online for non-urgent symptoms. For details visit [email or web address]. °  °Also download the MyChart app! Go to the app store, search "MyChart", open the app, select [place], and log in with your MyChart username and password. ° °Due to Covid, a mask is required upon entering the hospital/clinic. If you do not have a mask, one will be given to you upon arrival. For doctor visits, patients may have 1 support person aged 18 or older with them. For treatment visits, patients cannot have anyone with them due to current Covid guidelines and our immunocompromised population.  ° °

## 2021-12-10 NOTE — Progress Notes (Signed)
Patient has a decline in appetite with 6 lb wt loss (has not been evaluated by Desmond Lope)  Tries to eat 2 meals a day and drinks meal replacement shakes about 3 days a week with 3-4 shakes on those days.  Needing a refill on cough syrup and inhaler cough better but not gone.  Went to ER on 5/15 due to not feeling well.  COVID test negative and chest x-ray did not show pneumonia.  Does not sleep well at night.  Refill for cough syrup and Albuterol pended for MD review.  In office HR 49, bp 142/75, 02 95%

## 2021-12-10 NOTE — Progress Notes (Signed)
[facility] OFFICE PROGRESS NOTE  Patient Care Team: Marguerita Merles, MD as PCP - General (Family Medicine) End, Harrell Gave, MD as PCP - Cardiology (Cardiology) [name], [name] [name], MD as Consulting Physician (Hematology and Oncology) Ottie Glazier, MD as Consulting Physician (Pulmonary Disease) Gabriel Carina Betsey Holiday, MD as Consulting Physician (Endocrinology)   Cancer Staging  No matching staging information was found for the patient.   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 No morphologic support for residual myeloma disease. Negative for minimal residual disease by MM-MRD flow cytometry; HOLD REVLIMID [leg swelling]; AUG 2021-PET scan negative for myeloma; continue to hold Revlimid  # MARCH 2021-diastolic congestive heart failure [Dr.End]  # BMBx- OCT 2021- BMBx- 5% plasmacytosis-however this appears to be more polyclonal rather than monoclonal-not explain patient worsening anemia.   # OCT 2022- 18th- Dara- Rev-Dex  #November 2021-hyperthyroidism/goiter-  [D.[facility]/Dr.Solum]-methimazole. S/p Thyroidectomy [Dr.Kim; [id number]] --------------------------  # 12/12- RIGHT JUGULAR DVT-x 392mn xarelto; finished April 2020; September 2020-EGD/dysphagia; Dr. AnVicente Males# Acute renal failure [Dr.Singh; Proteinuria 1.5gm/day ]; acyclovir/Asprin ------------------------------------------------------------------------------------------------------------   DIAGNOSIS: '[ ]'  MULTIPLE MYELOMA  STAGE: III/HIGH RISK ;GOALS: CONTROL  CURRENT/MOST RECENT THERAPY-maintenance Revlimid- on HOLD    Multiple myeloma not having achieved remission ([facility] 11/21/2017 - 04/28/2018 Chemotherapy   Patient is on Treatment Plan : MYELOMA SALVAGE Cyclophosphamide / Carfilzomib / Dexamethasone (CCd) q28d      05/12/2021 -  Chemotherapy   Patient is on Treatment Plan : MYELOMA RELAPSED REFRACTORY Daratumumab SQ + Lenalidomide + Dexamethasone (DaraRd) q28d       INTERVAL HISTORY: Ambulating independently.  Alone.  Brandi [id number].  female pleasant patient above history of relapsed multiple myeloma--currently on daratumumab-Revlimid-dexamethasone  is here for follow-up.  Patient was seen in the emergency room on May 15 for shortness of breath cough. COVID test negative and chest x-ray did not show pneumonia.  Does not sleep well at night.  Patient muscle spasm improved since being on tramadol every 12 hours.  Appetite is improving. Currently no fever no chills.  Patient is gaining weight.  No infusion reactions.   Review of Systems  Constitutional:  Positive for malaise/fatigue. Negative for chills, diaphoresis and fever.  HENT:  Negative for nosebleeds and sore throat.   Eyes:  Negative for double vision.  Respiratory:  Negative for hemoptysis.   Cardiovascular:  Negative for chest pain, palpitations and orthopnea.  Gastrointestinal:  Negative for abdominal pain, blood in stool, constipation, heartburn and melena.  Genitourinary:  Negative for dysuria,  frequency and urgency.  Musculoskeletal:  Positive for back pain.  Skin: Negative.  Negative for itching and rash.  Neurological:  Negative for dizziness, tingling, focal weakness, weakness and headaches.  Endo/Heme/Allergies:  Does not bruise/bleed easily.  Psychiatric/Behavioral:  Negative for depression. The patient is not nervous/anxious.    PAST MEDICAL HISTORY :  Past Medical History:  Diagnosis Date   (HFpEF) heart failure with preserved ejection fraction ([place])    a. 06/2020 Echo: EF 60-65%, no rwma, nl RV size/fxn. Triv MR. Triv TR/AI. Mod elev PASP.   Anxiety    COPD (chronic obstructive pulmonary disease) (HCC)    GERD (gastroesophageal reflux disease)    Hypertension    Hyperthyroidism    Hypokalemia    IgA myeloma (HCC)    Multiple myeloma (HCC)    Pneumonia    Red blood cell antibody positive, compatible PRBC difficult to obtain    S/P autologous bone marrow transplantation ([place])     PAST SURGICAL HISTORY :   Past Surgical History:  Procedure Laterality Date   ANTERIOR VITRECTOMY Left 10/07/2021   Procedure: ANTERIOR VITRECTOMY;  Surgeon: Leandrew Koyanagi, MD;  Location: [place] [facility] [facility];  Service: Ophthalmology;  Laterality: Left;   CATARACT EXTRACTION W/PHACO Left 10/07/2021   Procedure: CATARACT EXTRACTION PHACO AND INTRAOCULAR LENS PLACEMENT ([place]) LEFT VISION BLUE;  Surgeon: Leandrew Koyanagi, MD;  Location: [facility];  Service: Ophthalmology;  Laterality: Left;  kit for manual incision 14.56 01:21.4   CATARACT EXTRACTION W/PHACO Right 10/21/2021   Procedure: CATARACT EXTRACTION PHACO AND INTRAOCULAR LENS PLACEMENT ([facility]) RIGHT 11.12 01:48.1;  Surgeon: Leandrew Koyanagi, MD;  Location: [facility];  Service: Ophthalmology;  Laterality: Right;   ESOPHAGOGASTRODUODENOSCOPY (EGD) WITH PROPOFOL N/A 03/30/2019   Procedure: ESOPHAGOGASTRODUODENOSCOPY (EGD) WITH PROPOFOL;  Surgeon: Jonathon Bellows, MD;  Location[facility] ENDOSCOPY;  Service:  Gastroenterology;  Laterality: N/A;   IR FLUORO GUIDE PORT INSERTION RIGHT  12/16/2017   IR IMAGING GUIDED PORT INSERTION  06/15/2021   THYROIDECTOMY N/A     FAMILY HISTORY :   Family History  Problem Relation Age of Onset   Pneumonia Mother    Seizures Father     SOCIAL HISTORY:   Social History   Tobacco Use   Smoking status: Former    Packs/day: 0.25    Types: Cigarettes    Quit date: 03/18/2021    Years since quitting: 0.7   Smokeless tobacco: Never   Tobacco comments:    2 cigarettes QOD  Vaping Use   Vaping Use: Never used  Substance Use Topics   Alcohol use: No   Drug use: No    ALLERGIES:  has No Known Allergies.  MEDICATIONS:  Current Outpatient Medications  Medication Sig Dispense Refill   acyclovir (ZOVIRAX) 400 MG tablet Take 1 tablet (400 mg total) by mouth 2 (two) times daily. 60 tablet 3   aspirin EC 81 MG EC tablet Take 1 tablet (81 mg total) by mouth daily. 90 tablet 3   buPROPion (WELLBUTRIN) 75 MG tablet Take 75 mg by mouth daily.     calcium-vitamin D (OSCAL WITH D) 500MG-200UNIT (5MCG) tablet Take 1 tablet by mouth 2 (two) times daily. 60 tablet 0   carvedilol (COREG) 3.125 MG tablet Take 1 tablet (3.125 mg total) by mouth 2 (two) times daily with a meal. 60 tablet 5   clotrimazole-betamethasone (LOTRISONE) cream Apply topically 2 (two) times daily. 30 g 0   dexamethasone (DECADRON) 4 MG tablet Start 2 days prior to infusion; Take for 2 days. Do not take on the day of infusion. 60 tablet 3   Ensure Plus (ENSURE PLUS) LIQD Take 237 mLs by mouth 3 (three) times a week.  furosemide (LASIX) 40 MG tablet Take 1 tablet (40 mg total) by mouth daily. 90 tablet 3   lenalidomide (REVLIMID) 15 MG capsule Take 1 capsule (15 mg total) by mouth daily. Take for 21 days, then hold for 7 days. Repeat every 28 days. 21 capsule 0   lidocaine-prilocaine (EMLA) cream Apply 1 application topically as needed. Apply to port and cover with saran wrap 1-2 hours prior to  port access 30 g 1   magnesium oxide (MAG-OX) 400 (240 Mg) MG tablet Take 1 tablet (400 mg total) by mouth 2 (two) times daily. 60 tablet 3   ondansetron (ZOFRAN) 8 MG tablet One pill every 8 hours as needed for nausea/vomitting. 40 tablet 1   pantoprazole (PROTONIX) 40 MG tablet Take 1 tablet by mouth once daily 90 tablet 0   polyethylene glycol (MIRALAX / GLYCOLAX) packet Take 17 g by mouth daily as needed for mild constipation. 14 each 0   potassium chloride SA (KLOR-CON) 20 MEQ tablet Take 1 tablet (20 mEq total) by mouth 2 (two) times daily. 60 tablet 6   predniSONE (DELTASONE) 50 MG tablet Take 1 tablet (50 mg total) by mouth daily for 4 days. 4 tablet 0   spironolactone (ALDACTONE) 25 MG tablet Take 1 tablet (25 mg total) by mouth daily. 90 tablet 0   traMADol (ULTRAM) 50 MG tablet Take 1 tablet (50 mg total) by mouth every 12 (twelve) hours as needed. 60 tablet 0   Vitamin D, Ergocalciferol, (DRISDOL) 1.25 MG (50000 UNIT) CAPS capsule Take 1 capsule by mouth once a week 12 capsule 0   albuterol (VENTOLIN HFA) 108 (90 Base) MCG/ACT inhaler Inhale 2 puffs into the lungs every 6 (six) hours as needed for wheezing or shortness of breath. 8 g 3   guaiFENesin-codeine 100-10 MG/5ML syrup Take 5 mLs by mouth every 12 (twelve) hours as needed for cough. May cause drowsiness. Do not drive with this medication. 120 mL 0   HYDROcodone-acetaminophen (NORCO) 5-325 MG tablet Take 1 tablet by mouth every 6 (six) hours as needed for moderate pain. (Patient not taking: Reported on 12/10/2021) 30 tablet 0   levothyroxine (SYNTHROID) 100 MCG tablet Take 1 tablet (100 mcg total) by mouth daily before breakfast. 30 tablet 3   montelukast (SINGULAIR) 10 MG tablet Take 1 tablet (10 mg total) by mouth at bedtime. 30 tablet 3   No current facility-administered medications for this visit.   Facility-Administered Medications Ordered in Other Visits  Medication Dose Route Frequency Provider Last Rate Last Admin   0.9  %  sodium chloride infusion   Intravenous Continuous Monia Sabal, PA-C       daratumumab-hyaluronidase-fihj (DARZALEX FASPRO) 1800-30000 MG-UT/15ML chemo SQ injection 1,800 mg  1,800 mg Subcutaneous Once [name], MD       dexamethasone (DECADRON) tablet 20 mg  20 mg Oral Once [name], MD       heparin lock flush 100 unit/mL  500 Units Intravenous Once [name], MD        PHYSICAL EXAMINATION: ECOG PERFORMANCE STATUS: 1 - Symptomatic but completely ambulatory  BP (!) 142/75   Pulse (!) 49   Temp 98.6 F (37 C)   Resp 18   Wt 180 lb 9.6 oz (81.9 kg)   SpO2 95%   BMI 33.03 kg/m   Filed Weights   12/10/21 0900  Weight: 180 lb 9.6 oz (81.9 kg)    Physical Exam HENT:     Head: Normocephalic and atraumatic.  Eyes:     Pupils: Pupils are equal, round, and reactive to light.  Cardiovascular:     Rate and Rhythm: Normal rate and regular rhythm.  Pulmonary:     Effort: Pulmonary effort is normal. No respiratory distress.     Breath sounds: Normal breath sounds. No wheezing.  Abdominal:     General: Bowel sounds are normal. There is no distension.     Palpations: Abdomen is soft. There is no mass.     Tenderness: There is no abdominal tenderness. There is no guarding or rebound.  Musculoskeletal:        General: No tenderness. Normal range of motion.     Cervical back: Normal range of motion and neck supple.  Skin:    General: Skin is warm.  Neurological:     Mental Status: She is alert and oriented to person, place, and time.  Psychiatric:        Mood and Affect: Affect normal.   LABORATORY DATA:  I have reviewed the data as listed    Component Value Date/Time   NA 137 12/10/2021 0824   NA 141 01/25/2020 1530   K 3.6 12/10/2021 0824   CL 103 12/10/2021 0824   CO2 23 12/10/2021 0824   GLUCOSE 128 (H) 12/10/2021 0824   BUN 21 12/10/2021 0824   BUN 19 01/25/2020 1530   CREATININE 0.87 12/10/2021 0824   CALCIUM 8.3 (L)  12/10/2021 0824   PROT 7.8 12/10/2021 0824   ALBUMIN 3.3 (L) 12/10/2021 0824   AST 17 12/10/2021 0824   ALT 14 12/10/2021 0824   ALKPHOS 70 12/10/2021 0824   BILITOT 0.3 12/10/2021 0824   GFRNONAA >60 12/10/2021 0824   GFRAA NOT CALCULATED 04/17/2020 1352    No results found for: SPEP, UPEP  Lab Results  Component Value Date   WBC 11.8 (H) 12/10/2021   NEUTROABS 9.7 (H) 12/10/2021   HGB 10.0 (L) 12/10/2021   HCT 30.2 (L) 12/10/2021   MCV 92.1 12/10/2021   PLT 246 12/10/2021      Chemistry      Component Value Date/Time   NA 137 12/10/2021 0824   NA 141 01/25/2020 1530   K 3.6 12/10/2021 0824   CL 103 12/10/2021 0824   CO2 23 12/10/2021 0824   BUN 21 12/10/2021 0824   BUN 19 01/25/2020 1530   CREATININE 0.87 12/10/2021 0824      Component Value Date/Time   CALCIUM 8.3 (L) 12/10/2021 0824   ALKPHOS 70 12/10/2021 0824   AST 17 12/10/2021 0824   ALT 14 12/10/2021 0824   BILITOT 0.3 12/10/2021 0824      (H): Data is abnormally high   Latest Reference Range & Units Most Recent 12/24/20 14:01 04/01/21 14:34 04/28/21 09:17 06/11/21 09:42 07/03/21 10:32 08/20/21 10:06  M Protein SerPl Elph-Mcnc Not Observed g/dL 1.1 (H) (C) 08/20/21 10:06 Not Observed (C) 0.6 (H) (C) 0.7 (H) (C) 0.5 (H) (C) 0.7 (H) (C) 1.1 (H) (C)    Latest Reference Range & Units Most Recent 09/17/21 08:47 10/15/21 08:25 11/12/21 08:55  Kappa free light chain 3.3 - 19.4 mg/L 5.0 11/12/21 08:55 7.2 7.2 5.0  Lambda free light chains 5.7 - 26.3 mg/L 223.6 (H) 11/12/21 08:55 215.3 (H) 340.2 (H) 223.6 (H)  Kappa, lambda light chain ratio 0.26 - 1.65  0.02 (L) 11/12/21 08:55 0.03 (L) 0.02 (L) 0.02 (L)  (H): Data is abnormally high (L): Data is abnormally low  RADIOGRAPHIC STUDIES: I have personally reviewed the radiological  images as listed and agreed with the findings in the report. No results found.   ASSESSMENT & PLAN:  Multiple myeloma not having achieved remission ([place]) # RECURRENT Multiple  myeloma stage III [high-risk cytogenetics-status post KRD-VGPR ; status post autologous stem cell transplant on 06/15/18.  Currently recurrent-on salvage therapy.   # Patient currently on salvage therapy with daratumumab  lenalidomide [15 mg 3 w; 1-w] dexamethasone.    #Continue cycle #2-day 15 Dara SQ weekly; continue REVLIMID 15 mg again. 3 weeks/1 week off; continue. Labs today reviewed;  acceptable for treatment today. April 2023- 1.2 gm; lamda LC= 230.  Awaiting repeat labs from today.  #Dry cough- ?  Chest x-ray negative for pneumonia.  Seasonal allergies/pollen-continue Singulair.  Continue inhalers; cough prescription sent.  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   # Muscle cramps-?  Revlimid. improved  On tramadol q 12 hours prn-STABLE.   # Chronic diastolic CHF-clinically stable  # Bone lesions /last zometa on 11/27/2017.  Holding Zometa secondary to poor dentition.  Awaiting to speak to dentistry regarding the patient dental extraction.  # IV access: mediport   :MM panle; K/l light chain ratio q 4 W  # DISPOSITION:  #  Dara today # 1 weeks- labs- cbc/cmp; dara IV #  Follow up on 6/08-- MD;port- labs-cbc/cmp;- Dara IV Dr.B    Orders Placed This Encounter  Procedures   CBC with Differential/Platelet    Standing Status:   Future    Standing Expiration Date:   12/11/2022   Comprehensive metabolic panel    Standing Status:   Future    Standing Expiration Date:   12/11/2022   CBC with Differential/Platelet    Standing Status:   Future    Standing Expiration Date:   12/11/2022   Comprehensive metabolic panel    Standing Status:   Future    Standing Expiration Date:   12/11/2022     All questions were answered. The patient knows to call the clinic with any problems, questions or concerns.      [name], MD 12/10/2021 9:47 AM

## 2021-12-11 LAB — THYROID PANEL WITH TSH
Free Thyroxine Index: 1.1 — ABNORMAL LOW (ref 1.2–4.9)
T3 Uptake Ratio: 23 % — ABNORMAL LOW (ref 24–39)
T4, Total: 4.9 ug/dL (ref 4.5–12.0)
TSH: 2.42 u[IU]/mL (ref 0.450–4.500)

## 2021-12-11 LAB — KAPPA/LAMBDA LIGHT CHAINS
Kappa free light chain: 5.1 mg/L (ref 3.3–19.4)
Kappa, lambda light chain ratio: 0.04 — ABNORMAL LOW (ref 0.26–1.65)
Lambda free light chains: 119.6 mg/L — ABNORMAL HIGH (ref 5.7–26.3)

## 2021-12-14 LAB — MULTIPLE MYELOMA PANEL, SERUM
Albumin SerPl Elph-Mcnc: 3.4 g/dL (ref 2.9–4.4)
Albumin/Glob SerPl: 1 (ref 0.7–1.7)
Alpha 1: 0.2 g/dL (ref 0.0–0.4)
Alpha2 Glob SerPl Elph-Mcnc: 0.9 g/dL (ref 0.4–1.0)
B-Globulin SerPl Elph-Mcnc: 1.6 g/dL — ABNORMAL HIGH (ref 0.7–1.3)
Gamma Glob SerPl Elph-Mcnc: 1.1 g/dL (ref 0.4–1.8)
Globulin, Total: 3.7 g/dL (ref 2.2–3.9)
IgA: 799 mg/dL — ABNORMAL HIGH (ref 87–352)
IgG (Immunoglobin G), Serum: 415 mg/dL — ABNORMAL LOW (ref 586–1602)
IgM (Immunoglobulin M), Srm: <5 mg/dL — ABNORMAL LOW (ref 26–217)
M Protein SerPl Elph-Mcnc: 0.7 g/dL — ABNORMAL HIGH
Total Protein ELP: 7.1 g/dL (ref 6.0–8.5)

## 2021-12-17 ENCOUNTER — Inpatient Hospital Stay: Payer: Medicare Other

## 2021-12-17 VITALS — BP 139/70 | HR 57 | Temp 97.1°F | Resp 16 | Wt 180.3 lb

## 2021-12-17 DIAGNOSIS — Z5112 Encounter for antineoplastic immunotherapy: Secondary | ICD-10-CM | POA: Diagnosis not present

## 2021-12-17 DIAGNOSIS — C9000 Multiple myeloma not having achieved remission: Secondary | ICD-10-CM

## 2021-12-17 LAB — CBC WITH DIFFERENTIAL/PLATELET
Abs Immature Granulocytes: 0.02 10*3/uL (ref 0.00–0.07)
Basophils Absolute: 0 10*3/uL (ref 0.0–0.1)
Basophils Relative: 0 %
Eosinophils Absolute: 0 10*3/uL (ref 0.0–0.5)
Eosinophils Relative: 0 %
HCT: 31.5 % — ABNORMAL LOW (ref 36.0–46.0)
Hemoglobin: 10.3 g/dL — ABNORMAL LOW (ref 12.0–15.0)
Immature Granulocytes: 0 %
Lymphocytes Relative: 17 %
Lymphs Abs: 0.9 10*3/uL (ref 0.7–4.0)
MCH: 30.2 pg (ref 26.0–34.0)
MCHC: 32.7 g/dL (ref 30.0–36.0)
MCV: 92.4 fL (ref 80.0–100.0)
Monocytes Absolute: 0.2 10*3/uL (ref 0.1–1.0)
Monocytes Relative: 4 %
Neutro Abs: 4.1 10*3/uL (ref 1.7–7.7)
Neutrophils Relative %: 79 %
Platelets: 250 10*3/uL (ref 150–400)
RBC: 3.41 MIL/uL — ABNORMAL LOW (ref 3.87–5.11)
RDW: 16.2 % — ABNORMAL HIGH (ref 11.5–15.5)
WBC: 5.1 10*3/uL (ref 4.0–10.5)
nRBC: 0 % (ref 0.0–0.2)

## 2021-12-17 LAB — COMPREHENSIVE METABOLIC PANEL
ALT: 14 U/L (ref 0–44)
AST: 21 U/L (ref 15–41)
Albumin: 3.3 g/dL — ABNORMAL LOW (ref 3.5–5.0)
Alkaline Phosphatase: 69 U/L (ref 38–126)
Anion gap: 10 (ref 5–15)
BUN: 12 mg/dL (ref 8–23)
CO2: 24 mmol/L (ref 22–32)
Calcium: 8.4 mg/dL — ABNORMAL LOW (ref 8.9–10.3)
Chloride: 102 mmol/L (ref 98–111)
Creatinine, Ser: 0.73 mg/dL (ref 0.44–1.00)
GFR, Estimated: >60 mL/min (ref >60)
Glucose, Bld: 120 mg/dL — ABNORMAL HIGH (ref 70–99)
Potassium: 3.9 mmol/L (ref 3.5–5.1)
Sodium: 136 mmol/L (ref 135–145)
Total Bilirubin: 0.4 mg/dL (ref 0.3–1.2)
Total Protein: 7.6 g/dL (ref 6.5–8.1)

## 2021-12-17 MED ORDER — SODIUM CHLORIDE 0.9% FLUSH
10.0000 mL | Freq: Once | INTRAVENOUS | Status: AC
  Administered 2021-12-17: 10 mL via INTRAVENOUS
  Filled 2021-12-17: qty 10

## 2021-12-17 MED ORDER — DARATUMUMAB-HYALURONIDASE-FIHJ 1800-30000 MG-UT/15ML ~~LOC~~ SOLN
1800.0000 mg | Freq: Once | SUBCUTANEOUS | Status: AC
  Administered 2021-12-17: 1800 mg via SUBCUTANEOUS
  Filled 2021-12-17: qty 15

## 2021-12-17 MED ORDER — HEPARIN SOD (PORK) LOCK FLUSH 100 UNIT/ML IV SOLN
500.0000 [IU] | Freq: Once | INTRAVENOUS | Status: AC
  Filled 2021-12-17: qty 5

## 2021-12-17 MED ORDER — HEPARIN SOD (PORK) LOCK FLUSH 100 UNIT/ML IV SOLN
INTRAVENOUS | Status: AC
  Administered 2021-12-17: 500 [IU] via INTRAVENOUS
  Filled 2021-12-17: qty 5

## 2021-12-17 MED ORDER — DEXAMETHASONE 4 MG PO TABS
20.0000 mg | ORAL_TABLET | Freq: Once | ORAL | Status: AC
  Administered 2021-12-17: 20 mg via ORAL
  Filled 2021-12-17: qty 5

## 2021-12-17 MED ORDER — ACETAMINOPHEN 325 MG PO TABS
650.0000 mg | ORAL_TABLET | Freq: Once | ORAL | Status: AC
  Administered 2021-12-17: 650 mg via ORAL
  Filled 2021-12-17: qty 2

## 2021-12-17 MED ORDER — DIPHENHYDRAMINE HCL 25 MG PO CAPS
50.0000 mg | ORAL_CAPSULE | Freq: Once | ORAL | Status: AC
  Administered 2021-12-17: 50 mg via ORAL
  Filled 2021-12-17: qty 2

## 2021-12-23 ENCOUNTER — Other Ambulatory Visit: Payer: Self-pay | Admitting: *Deleted

## 2021-12-23 DIAGNOSIS — C9002 Multiple myeloma in relapse: Secondary | ICD-10-CM

## 2021-12-23 MED ORDER — LENALIDOMIDE 15 MG PO CAPS: 15.0000 mg | ORAL_CAPSULE | Freq: Every day | ORAL | 0 refills | Status: DC

## 2021-12-23 NOTE — Telephone Encounter (Signed)
Remember to have your patient take her survey every six months. Her last survey was on October 19, 2021 and her next survey will be available on March 29, 2022. Your prescription authorization number is [id number]. This number is only valid for 30 days.

## 2021-12-31 ENCOUNTER — Inpatient Hospital Stay: Payer: Medicare Other

## 2021-12-31 ENCOUNTER — Inpatient Hospital Stay (HOSPITAL_BASED_OUTPATIENT_CLINIC_OR_DEPARTMENT_OTHER): Payer: Medicare Other | Admitting: Internal Medicine

## 2021-12-31 ENCOUNTER — Inpatient Hospital Stay: Payer: Medicare Other | Admitting: Nurse Practitioner

## 2021-12-31 ENCOUNTER — Encounter: Payer: Self-pay | Admitting: Internal Medicine

## 2021-12-31 ENCOUNTER — Inpatient Hospital Stay: Payer: Medicare Other | Attending: Nurse Practitioner

## 2021-12-31 DIAGNOSIS — C9000 Multiple myeloma not having achieved remission: Secondary | ICD-10-CM | POA: Diagnosis not present

## 2021-12-31 DIAGNOSIS — Z79899 Other long term (current) drug therapy: Secondary | ICD-10-CM | POA: Diagnosis not present

## 2021-12-31 DIAGNOSIS — Z5112 Encounter for antineoplastic immunotherapy: Secondary | ICD-10-CM | POA: Diagnosis present

## 2021-12-31 LAB — COMPREHENSIVE METABOLIC PANEL
ALT: 11 U/L (ref 0–44)
AST: 18 U/L (ref 15–41)
Albumin: 3.6 g/dL (ref 3.5–5.0)
Alkaline Phosphatase: 80 U/L (ref 38–126)
Anion gap: 11 (ref 5–15)
BUN: 20 mg/dL (ref 8–23)
CO2: 22 mmol/L (ref 22–32)
Calcium: 8.3 mg/dL — ABNORMAL LOW (ref 8.9–10.3)
Chloride: 103 mmol/L (ref 98–111)
Creatinine, Ser: 1.11 mg/dL — ABNORMAL HIGH (ref 0.44–1.00)
GFR, Estimated: 54 mL/min — ABNORMAL LOW (ref >60)
Glucose, Bld: 96 mg/dL (ref 70–99)
Potassium: 4 mmol/L (ref 3.5–5.1)
Sodium: 136 mmol/L (ref 135–145)
Total Bilirubin: 0.6 mg/dL (ref 0.3–1.2)
Total Protein: 7.9 g/dL (ref 6.5–8.1)

## 2021-12-31 LAB — CBC WITH DIFFERENTIAL/PLATELET
Abs Immature Granulocytes: 0.05 10*3/uL (ref 0.00–0.07)
Basophils Absolute: 0 10*3/uL (ref 0.0–0.1)
Basophils Relative: 0 %
Eosinophils Absolute: 0 10*3/uL (ref 0.0–0.5)
Eosinophils Relative: 0 %
HCT: 30.2 % — ABNORMAL LOW (ref 36.0–46.0)
Hemoglobin: 9.7 g/dL — ABNORMAL LOW (ref 12.0–15.0)
Immature Granulocytes: 1 %
Lymphocytes Relative: 31 %
Lymphs Abs: 2.2 10*3/uL (ref 0.7–4.0)
MCH: 29.9 pg (ref 26.0–34.0)
MCHC: 32.1 g/dL (ref 30.0–36.0)
MCV: 93.2 fL (ref 80.0–100.0)
Monocytes Absolute: 0.4 10*3/uL (ref 0.1–1.0)
Monocytes Relative: 6 %
Neutro Abs: 4.4 10*3/uL (ref 1.7–7.7)
Neutrophils Relative %: 62 %
Platelets: 220 10*3/uL (ref 150–400)
RBC: 3.24 MIL/uL — ABNORMAL LOW (ref 3.87–5.11)
RDW: 16.4 % — ABNORMAL HIGH (ref 11.5–15.5)
WBC: 7.1 10*3/uL (ref 4.0–10.5)
nRBC: 0 % (ref 0.0–0.2)

## 2021-12-31 MED ORDER — DEXAMETHASONE 4 MG PO TABS
20.0000 mg | ORAL_TABLET | Freq: Once | ORAL | Status: AC
  Administered 2021-12-31: 20 mg via ORAL
  Filled 2021-12-31: qty 5

## 2021-12-31 MED ORDER — ACETAMINOPHEN 325 MG PO TABS
650.0000 mg | ORAL_TABLET | Freq: Once | ORAL | Status: AC
  Administered 2021-12-31: 650 mg via ORAL
  Filled 2021-12-31: qty 2

## 2021-12-31 MED ORDER — HEPARIN SOD (PORK) LOCK FLUSH 100 UNIT/ML IV SOLN
INTRAVENOUS | Status: AC
  Administered 2021-12-31: 500 [IU]
  Filled 2021-12-31: qty 5

## 2021-12-31 MED ORDER — DIPHENHYDRAMINE HCL 25 MG PO CAPS
50.0000 mg | ORAL_CAPSULE | Freq: Once | ORAL | Status: AC
  Administered 2021-12-31: 50 mg via ORAL
  Filled 2021-12-31: qty 2

## 2021-12-31 MED ORDER — DARATUMUMAB-HYALURONIDASE-FIHJ 1800-30000 MG-UT/15ML ~~LOC~~ SOLN
1800.0000 mg | Freq: Once | SUBCUTANEOUS | Status: AC
  Administered 2021-12-31: 1800 mg via SUBCUTANEOUS
  Filled 2021-12-31: qty 15

## 2021-12-31 NOTE — Progress Notes (Signed)
[place] OFFICE PROGRESS NOTE  Patient Care Team: Marguerita Merles, MD as PCP - General (Family Medicine) End, Harrell Gave, MD as PCP - Cardiology (Cardiology) [name], [name] [name], MD as Consulting Physician (Hematology and Oncology) Ottie Glazier, MD as Consulting Physician (Pulmonary Disease) Gabriel Carina Betsey Holiday, MD as Consulting Physician (Endocrinology)   Cancer Staging  No matching staging information was found for the patient.   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 No morphologic support for residual myeloma disease. Negative for minimal residual disease by MM-MRD flow cytometry; HOLD REVLIMID [leg swelling]; AUG 2021-PET scan negative for myeloma; continue to hold Revlimid  # MARCH 2021-diastolic congestive heart failure [Dr.End]  # BMBx- OCT 2021- BMBx- 5% plasmacytosis-however this appears to be more polyclonal rather than monoclonal-not explain patient worsening anemia.   # OCT 2022- 18th- Dara- Rev-Dex  #November 2021-hyperthyroidism/goiter-  [D.Bennett/Dr.Solum]-methimazole. S/p Thyroidectomy [Dr.Kim; [facility]453] --------------------------  # 12/12- RIGHT JUGULAR DVT-x 372mn xarelto; finished April 2020; September 2020-EGD/dysphagia; Dr. AnVicente Males# Acute renal failure [Dr.Singh; Proteinuria 1.5gm/day ]; acyclovir/Asprin ------------------------------------------------------------------------------------------------------------   DIAGNOSIS: _0  MULTIPLE MYELOMA  STAGE: III/HIGH RISK ;GOALS: CONTROL  CURRENT/MOST RECENT THERAPY-maintenance Revlimid- on HOLD    Multiple myeloma not having achieved remission ([facility] 11/21/2017 - 04/28/2018 Chemotherapy   Patient is on Treatment Plan : MYELOMA SALVAGE Cyclophosphamide / Carfilzomib / Dexamethasone (CCd) q28d     05/12/2021 -  Chemotherapy   Patient is on Treatment Plan : MYELOMA RELAPSED REFRACTORY Daratumumab SQ + Lenalidomide + Dexamethasone (DaraRd) q28d      INTERVAL HISTORY: Ambulating independently.  Alone.  VaALZORA [id number].  female pleasant patient above history of relapsed multiple myeloma--currently on daratumumab-Revlimid-dexamethasone  is here for follow-up.  Patient has not had any hospitalizations since last visit. Patient muscle spasm improved since being on tramadol every 12 hours.    Appetite is improving. Currently no fever no chills.  Patient is gaining weight.  No infusion reactions.  Cough improved.   Review of Systems  Constitutional:  Positive for malaise/fatigue. Negative for chills, diaphoresis and fever.  HENT:  Negative for nosebleeds and sore throat.   Eyes:  Negative for double vision.  Respiratory:  Negative for hemoptysis.   Cardiovascular:  Negative for chest pain, palpitations and orthopnea.  Gastrointestinal:  Negative for abdominal pain, blood in stool, constipation, heartburn and melena.  Genitourinary:  Negative for dysuria, frequency and urgency.  Musculoskeletal:  Positive for back pain.  Skin: Negative.  Negative for  itching and rash.  Neurological:  Negative for dizziness, tingling, focal weakness, weakness and headaches.  Endo/Heme/Allergies:  Does not bruise/bleed easily.  Psychiatric/Behavioral:  Negative for depression. The patient is not nervous/anxious.     PAST  MEDICAL HISTORY :  Past Medical History:  Diagnosis Date  . (HFpEF) heart failure with preserved ejection fraction ([place])    a. 06/2020 Echo: EF 60-65%, no rwma, nl RV size/fxn. Triv MR. Triv TR/AI. Mod elev PASP.  [name] Anxiety   . COPD (chronic obstructive pulmonary disease) ([place])   . GERD (gastroesophageal reflux disease)   . Hypertension   . Hyperthyroidism   . Hypokalemia   . IgA myeloma ([place])   . Multiple myeloma ([place])   . Pneumonia   . Red blood cell antibody positive, compatible PRBC difficult to obtain   . S/P autologous bone marrow transplantation ([place])     PAST SURGICAL HISTORY :   Past Surgical History:  Procedure Laterality Date  . ANTERIOR VITRECTOMY Left 10/07/2021   Procedure: ANTERIOR VITRECTOMY;  Surgeon: Leandrew Koyanagi, MD;  Location: [facility];  Service: Ophthalmology;  Laterality: Left;  . CATARACT EXTRACTION W/PHACO Left 10/07/2021   Procedure: CATARACT EXTRACTION PHACO AND INTRAOCULAR LENS PLACEMENT ([place]) LEFT VISION BLUE;  Surgeon: Leandrew Koyanagi, MD;  Location: [facility] [place];  Service: Ophthalmology;  Laterality: Left;  kit for manual incision 14.56 01:21.4  . CATARACT EXTRACTION W/PHACO Right 10/21/2021   Procedure: CATARACT EXTRACTION PHACO AND INTRAOCULAR LENS PLACEMENT ([facility]) RIGHT 11.12 01:48.1;  Surgeon: Leandrew Koyanagi, MD;  Location: [place];  Service: Ophthalmology;  Laterality: Right;  . ESOPHAGOGASTRODUODENOSCOPY (EGD) WITH PROPOFOL N/A 03/30/2019   Procedure: ESOPHAGOGASTRODUODENOSCOPY (EGD) WITH PROPOFOL;  Surgeon: Jonathon Bellows, MD;  Location: [facility];  Service: Gastroenterology;  Laterality: N/A;  . IR FLUORO GUIDE PORT INSERTION RIGHT  12/16/2017   . IR IMAGING GUIDED PORT INSERTION  06/15/2021  . THYROIDECTOMY N/A     FAMILY HISTORY :   Family History  Problem Relation Age of Onset  . Pneumonia Mother   . Seizures Father     SOCIAL HISTORY:   Social History   Tobacco Use  . Smoking status: Former    Packs/day: 0.25    Types: Cigarettes    Quit date: 03/18/2021    Years since quitting: 0.7  . Smokeless tobacco: Never  . Tobacco comments:    2 cigarettes QOD  Vaping Use  . Vaping Use: Never used  Substance Use Topics  . Alcohol use: No  . Drug use: No    ALLERGIES:  has No Known Allergies.  MEDICATIONS:  Current Outpatient Medications  Medication Sig Dispense Refill  . acyclovir (ZOVIRAX) 400 MG tablet Take 1 tablet (400 mg total) by mouth 2 (two) times daily. 60 tablet 3  . albuterol (VENTOLIN HFA) 108 (90 Base) MCG/ACT inhaler Inhale 2 puffs into the lungs every 6 (six) hours as needed for wheezing or shortness of breath. 8 g 3  . aspirin EC 81 MG EC tablet Take 1 tablet (81 mg total) by mouth daily. 90 tablet 3  . buPROPion (WELLBUTRIN) 75 MG tablet Take 75 mg by mouth daily.    . calcium-vitamin D (OSCAL WITH D) 500MG-200UNIT (5MCG) tablet Take 1 tablet by mouth 2 (two) times daily. 60 tablet 0  . carvedilol (COREG) 3.125 MG tablet Take 1 tablet (3.125 mg total) by mouth 2 (two) times daily with a meal. 60 tablet 5  . clotrimazole-betamethasone (LOTRISONE) cream Apply topically 2 (two) times daily. 30 g 0  . dexamethasone (DECADRON) 4 MG tablet Start 2 days prior to infusion; Take for 2 days. Do not take on the day of infusion. 60 tablet 3  . Ensure Plus (ENSURE PLUS) LIQD Take 237  mLs by mouth 3 (three) times a week.    . furosemide (LASIX) 40 MG tablet Take 1 tablet (40 mg total) by mouth daily. 90 tablet 3  . lenalidomide (REVLIMID) 15 MG capsule Take 1 capsule (15 mg total) by mouth daily. Take for 21 days, then hold for 7 days. Repeat every 28 days. 21 capsule 0  . levothyroxine (SYNTHROID) 100 MCG tablet  Take 1 tablet (100 mcg total) by mouth daily before breakfast. 30 tablet 3  . lidocaine-prilocaine (EMLA) cream Apply 1 application topically as needed. Apply to port and cover with saran wrap 1-2 hours prior to port access 30 g 1  . magnesium oxide (MAG-OX) 400 (240 Mg) MG tablet Take 1 tablet (400 mg total) by mouth 2 (two) times daily. 60 tablet 3  . montelukast (SINGULAIR) 10 MG tablet Take 1 tablet (10 mg total) by mouth at bedtime. 30 tablet 3  . ondansetron (ZOFRAN) 8 MG tablet One pill every 8 hours as needed for nausea/vomitting. 40 tablet 1  . pantoprazole (PROTONIX) 40 MG tablet Take 1 tablet by mouth once daily 90 tablet 0  . polyethylene glycol (MIRALAX / GLYCOLAX) packet Take 17 g by mouth daily as needed for mild constipation. 14 each 0  . potassium chloride SA (KLOR-CON) 20 MEQ tablet Take 1 tablet (20 mEq total) by mouth 2 (two) times daily. 60 tablet 6  . spironolactone (ALDACTONE) 25 MG tablet Take 1 tablet (25 mg total) by mouth daily. 90 tablet 0  . traMADol (ULTRAM) 50 MG tablet Take 1 tablet (50 mg total) by mouth every 12 (twelve) hours as needed. 60 tablet 0  . Vitamin D, Ergocalciferol, (DRISDOL) 1.25 MG (50000 UNIT) CAPS capsule Take 1 capsule by mouth once a week 12 capsule 0  . guaiFENesin-codeine 100-10 MG/5ML syrup Take 5 mLs by mouth every 12 (twelve) hours as needed for cough. May cause drowsiness. Do not drive with this medication. (Patient not taking: Reported on 12/31/2021) 120 mL 0  . HYDROcodone-acetaminophen (NORCO) 5-325 MG tablet Take 1 tablet by mouth every 6 (six) hours as needed for moderate pain. (Patient not taking: Reported on 12/10/2021) 30 tablet 0   No current facility-administered medications for this visit.   Facility-Administered Medications Ordered in Other Visits  Medication Dose Route Frequency Provider Last Rate Last Admin  . 0.9 %  sodium chloride infusion   Intravenous Continuous Monia Sabal, PA-C        PHYSICAL EXAMINATION: ECOG  PERFORMANCE STATUS: 1 - Symptomatic but completely ambulatory  BP 118/71   Pulse (!) 52   Temp 98.4 F (36.9 C)   Resp 16   Wt 177 lb 6.4 oz (80.5 kg)   SpO2 96%   BMI 32.45 kg/m   Filed Weights   12/31/21 1100  Weight: 177 lb 6.4 oz (80.5 kg)     Physical Exam HENT:     Head: Normocephalic and atraumatic.  Eyes:     Pupils: Pupils are equal, round, and reactive to light.  Cardiovascular:     Rate and Rhythm: Normal rate and regular rhythm.  Pulmonary:     Effort: Pulmonary effort is normal. No respiratory distress.     Breath sounds: Normal breath sounds. No wheezing.  Abdominal:     General: Bowel sounds are normal. There is no distension.     Palpations: Abdomen is soft. There is no mass.     Tenderness: There is no abdominal tenderness. There is no guarding or rebound.  Musculoskeletal:  General: No tenderness. Normal range of motion.     Cervical back: Normal range of motion and neck supple.  Skin:    General: Skin is warm.  Neurological:     Mental Status: She is alert and oriented to person, place, and time.  Psychiatric:        Mood and Affect: Affect normal.   LABORATORY DATA:  I have reviewed the data as listed    Component Value Date/Time   NA 136 12/31/2021 1120   NA 141 01/25/2020 1530   K 4.0 12/31/2021 1120   CL 103 12/31/2021 1120   CO2 22 12/31/2021 1120   GLUCOSE 96 12/31/2021 1120   BUN 20 12/31/2021 1120   BUN 19 01/25/2020 1530   CREATININE 1.11 (H) 12/31/2021 1120   CALCIUM 8.3 (L) 12/31/2021 1120   PROT 7.9 12/31/2021 1120   ALBUMIN 3.6 12/31/2021 1120   AST 18 12/31/2021 1120   ALT 11 12/31/2021 1120   ALKPHOS 80 12/31/2021 1120   BILITOT 0.6 12/31/2021 1120   GFRNONAA 54 (L) 12/31/2021 1120   GFRAA NOT CALCULATED 04/17/2020 1352    No results found for: "SPEP", "UPEP"  Lab Results  Component Value Date   WBC 7.1 12/31/2021   NEUTROABS 4.4 12/31/2021   HGB 9.7 (L) 12/31/2021   HCT 30.2 (L) 12/31/2021   MCV 93.2  12/31/2021   PLT 220 12/31/2021      Chemistry      Component Value Date/Time   NA 136 12/31/2021 1120   NA 141 01/25/2020 1530   K 4.0 12/31/2021 1120   CL 103 12/31/2021 1120   CO2 22 12/31/2021 1120   BUN 20 12/31/2021 1120   BUN 19 01/25/2020 1530   CREATININE 1.11 (H) 12/31/2021 1120      Component Value Date/Time   CALCIUM 8.3 (L) 12/31/2021 1120   ALKPHOS 80 12/31/2021 1120   AST 18 12/31/2021 1120   ALT 11 12/31/2021 1120   BILITOT 0.6 12/31/2021 1120      (H): Data is abnormally high   Latest Reference Range & Units Most Recent 12/24/20 14:01 04/01/21 14:34 04/28/21 09:17 06/11/21 09:42 07/03/21 10:32 08/20/21 10:06  M Protein SerPl Elph-Mcnc Not Observed g/dL 1.1 (H) (C) 08/20/21 10:06 Not Observed (C) 0.6 (H) (C) 0.7 (H) (C) 0.5 (H) (C) 0.7 (H) (C) 1.1 (H) (C)    Latest Reference Range & Units Most Recent 09/17/21 08:47 10/15/21 08:25 11/12/21 08:55  Kappa free light chain 3.3 - 19.4 mg/L 5.0 11/12/21 08:55 7.2 7.2 5.0  Lambda free light chains 5.7 - 26.3 mg/L 223.6 (H) 11/12/21 08:55 215.3 (H) 340.2 (H) 223.6 (H)  Kappa, lambda light chain ratio 0.26 - 1.65  0.02 (L) 11/12/21 08:55 0.03 (L) 0.02 (L) 0.02 (L)  (H): Data is abnormally high (L): Data is abnormally low  RADIOGRAPHIC STUDIES: I have personally reviewed the radiological images as listed and agreed with the findings in the report. No results found.   ASSESSMENT & PLAN:  Multiple myeloma not having achieved remission ([place]) # RECURRENT Multiple myeloma stage III [high-risk cytogenetics-status post KRD-VGPR ; status post autologous stem cell transplant on 06/15/18.  Currently recurrent-on salvage therapy.   # Patient currently on salvage therapy with daratumumab  lenalidomide [15 mg 3 w; 1-w] dexamethasone.    #Continue cycle #3-day 15 Dara SQ weekly; continue REVLIMID 15 mg again. 3 weeks/1 week off; continue. Labs today reviewed;  acceptable for treatment today.MAY 2023- 0.7 gm; lamda LC= 119.   Improving.  #  Iatrogenic hypothyroidism [ Graves/goiter s/p total thyroidectomy;aug,2022 ]-  April 2023- TSH-LOW; decrease the dose of Synthroid to 100 mcg/day- MAY 2023- TSH- WNL.  # Muscle cramps-?  Revlimid. improved  On tramadol q 12 hours prn-STABLE.   # Chronic diastolic CHF-clinically STABLE.   # Bone lesions /last zometa on 11/27/2017.  Holding Zometa secondary to poor dentition.  Awaiting to speak to dentistry regarding the patient dental extraction.  # IV access: mediport   :MM panle; K/l light chain ratio q 4 W  # DISPOSITION:  #  Dara today #  Follow up in 2 weeks- MD;port- labs-cbc/cmp;- Dara SQ; MM panel; K/L light chains Dr.B    Orders Placed This Encounter  Procedures  . CBC with Differential/Platelet    Standing Status:   Future    Standing Expiration Date:   01/01/2023  . Comprehensive metabolic panel    Standing Status:   Future    Standing Expiration Date:   01/01/2023  . Kappa/lambda light chains    Standing Status:   Future    Standing Expiration Date:   01/01/2023  . Multiple Myeloma Panel (SPEP&IFE w/QIG)    Standing Status:   Future    Standing Expiration Date:   01/01/2023     All questions were answered. The patient knows to call the clinic with any problems, questions or concerns.      [name], MD 12/31/2021 3:51 PM

## 2021-12-31 NOTE — Progress Notes (Signed)
Still has muscle cramps but much better than they were.  Productive cough is betting better.  Feeling weak/sluggish for a while.

## 2021-12-31 NOTE — Patient Instructions (Signed)
[facility]  Discharge Instructions: ?Thank you for choosing [facility] to provide your oncology and hematology care.  ?If you have a lab appointment with the Cancer Center, please go directly to the Cancer Center and check in at the registration area. ? ?Wear comfortable clothing and clothing appropriate for easy access to any Portacath or PICC line.  ? ?We strive to give you quality time with your provider. You may need to reschedule your appointment if you arrive late (15 or more minutes).  Arriving late affects you and other patients whose appointments are after yours.  Also, if you miss three or more appointments without notifying the office, you may be dismissed from the clinic at the provider?s discretion.    ?  ?For prescription refill requests, have your pharmacy contact our office and allow 72 hours for refills to be completed.   ? ?Today you received the following chemotherapy and/or immunotherapy agents daratumumab    ?  ?To help prevent nausea and vomiting after your treatment, we encourage you to take your nausea medication as directed. ? ?BELOW ARE SYMPTOMS THAT SHOULD BE REPORTED IMMEDIATELY: ?*FEVER GREATER THAN 100.4 F (38 ?C) OR HIGHER ?*CHILLS OR SWEATING ?*NAUSEA AND VOMITING THAT IS NOT CONTROLLED WITH YOUR NAUSEA MEDICATION ?*UNUSUAL SHORTNESS OF BREATH ?*UNUSUAL BRUISING OR BLEEDING ?*URINARY PROBLEMS (pain or burning when urinating, or frequent urination) ?*BOWEL PROBLEMS (unusual diarrhea, constipation, pain near the anus) ?TENDERNESS IN MOUTH AND THROAT WITH OR WITHOUT PRESENCE OF ULCERS (sore throat, sores in mouth, or a toothache) ?UNUSUAL RASH, SWELLING OR PAIN  ?UNUSUAL VAGINAL DISCHARGE OR ITCHING  ? ?Items with * indicate a potential emergency and should be followed up as soon as possible or go to the Emergency Department if any problems should occur. ? ?Please show the CHEMOTHERAPY ALERT CARD or IMMUNOTHERAPY ALERT CARD at check-in to  the Emergency Department and triage nurse. ? ?Should you have questions after your visit or need to cancel or reschedule your appointment, please contact [facility]  [phone] and follow the prompts.  Office hours are 8:00 a.m. to 4:30 p.m. Monday - Friday. Please note that voicemails left after 4:00 p.m. may not be returned until the following business day.  We are closed weekends and major holidays. You have access to a nurse at all times for urgent questions. Please call the main number to the clinic [phone] and follow the prompts. ? ?For any non-urgent questions, you may also contact your provider using MyChart. We now offer e-Visits for anyone 18 and older to request care online for non-urgent symptoms. For details visit [email or web address]. ?  ?Also download the MyChart app! Go to the app store, search "MyChart", open the app, select [facility], and log in with your MyChart username and password. ? ?Due to Covid, a mask is required upon entering the hospital/clinic. If you do not have a mask, one will be given to you upon arrival. For doctor visits, patients may have 1 support person aged 18 or older with them. For treatment visits, patients cannot have anyone with them due to current Covid guidelines and our immunocompromised population.  ? ?Daratumumab injection ?What is this medication? ?DARATUMUMAB (dar a toom ue mab) is a monoclonal antibody. It is used to treat multiple myeloma. ?This medicine may be used for other purposes; ask your health care provider or pharmacist if you have questions. ?COMMON BRAND NAME(S): DARZALEX ?What should I tell my care team before I   take this medication? ?They need to know if you have any of these conditions: ?hereditary fructose intolerance ?infection (especially a virus infection such as chickenpox, herpes, or hepatitis B virus) ?lung or breathing disease (asthma, COPD) ?an unusual or allergic reaction to daratumumab,  sorbitol, other medicines, foods, dyes, or preservatives ?pregnant or trying to get pregnant ?breast-feeding ?How should I use this medication? ?This medicine is for infusion into a vein. It is given by a health care professional in a hospital or clinic setting. ?Talk to your pediatrician regarding the use of this medicine in children. Special care may be needed. ?Overdosage: If you think you have taken too much of this medicine contact a poison control center or emergency room at once. ?NOTE: This medicine is only for you. Do not share this medicine with others. ?What if I miss a dose? ?Keep appointments for follow-up doses as directed. It is important not to miss your dose. Call your doctor or health care professional if you are unable to keep an appointment. ?What may interact with this medication? ?Interactions have not been studied. ?This list may not describe all possible interactions. Give your health care provider a list of all the medicines, herbs, non-prescription drugs, or dietary supplements you use. Also tell them if you smoke, drink alcohol, or use illegal drugs. Some items may interact with your medicine. ?What should I watch for while using this medication? ?Your condition will be monitored carefully while you are receiving this medicine. ?This medicine can cause serious allergic reactions. To reduce your risk, your health care provider may give you other medicine to take before receiving this one. Be sure to follow the directions from your health care provider. ?This medicine can affect the results of blood tests to match your blood type. These changes can last for up to 6 months after the final dose. Your healthcare provider will do blood tests to match your blood type before you start treatment. Tell all of your healthcare providers that you are being treated with this medicine before receiving a blood transfusion. ?This medicine can affect the results of some tests used to determine treatment  response; extra tests may be needed to evaluate response. ?Do not become pregnant while taking this medicine or for 3 months after stopping it. Women should inform their health care provider if they wish to become pregnant or think they might be pregnant. There is a potential for serious side effects to an unborn child. Talk to your health care provider for more information. Do not breast-feed an infant while taking this medicine. ?What side effects may I notice from receiving this medication? ?Side effects that you should report to your care team as soon as possible: ?Allergic reactions--skin rash, itching or hives, swelling of the face, lips, or tongue ?Blurred vision ?Infection--fever, chills, cough, sore throat, pain or trouble passing urine ?Infusion reactions--dizziness, fast heartbeat, feeling faint or lightheaded, falls, headache, increase in blood pressure, nausea, vomiting, or wheezing or trouble breathing with loud or whistling sounds ?Unusual bleeding or bruising ?Side effects that usually do not require medical attention (report to your care team if they continue or are bothersome): ?Constipation ?Diarrhea ?Pain, tingling, numbness in the hands or feet ?Swelling of the ankles, feet, hands ?Tiredness ?This list may not describe all possible side effects. Call your doctor for medical advice about side effects. You may report side effects to FDA at 1-800-FDA-1088. ?Where should I keep my medication? ?This drug is given in a hospital or clinic and will not   be stored at home. NOTE: This sheet is a summary. It may not cover all possible information. If you have questions about this medicine, talk to your doctor, pharmacist, or health care provider.  2023 Elsevier/Gold Standard (2020-12-18 00:00:00)

## 2021-12-31 NOTE — Assessment & Plan Note (Addendum)
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  Currently recurrent-on salvage therapy.   # Patient currently on salvage therapy with daratumumab  lenalidomide [15 mg 3 w; 1-w] dexamethasone.    #Continue cycle #3-day 15 Dara SQ weekly; continue REVLIMID 15 mg again. 3 weeks/1 week off; continue. Labs today reviewed;  acceptable for treatment today.MAY 2023- 0.7 gm; lamda LC= 119.  Improving.  # Iatrogenic hypothyroidism [ Graves/goiter s/p total thyroidectomy;aug,2022 ]-  April 2023- TSH-LOW; decrease the dose of Synthroid to 100 mcg/day- MAY 2023- TSH- WNL.  # Muscle cramps-?  Revlimid. improved  On tramadol q 12 hours prn-STABLE.   # Chronic diastolic CHF-clinically STABLE.   # Bone lesions /last zometa on 11/27/2017.  Holding Zometa secondary to poor dentition.  Awaiting to speak to dentistry regarding the patient dental extraction.  # IV access: mediport   :MM panle; K/l light chain ratio q 4 W  # DISPOSITION:  #  Dara today #  Follow up in 2 weeks- MD;port- labs-cbc/cmp;- Dara SQ; MM panel; K/L light chains Dr.B

## 2022-01-11 ENCOUNTER — Ambulatory Visit: Payer: Medicare Other | Admitting: Medical

## 2022-01-11 NOTE — Progress Notes (Deleted)
Cardiology Office Note:    Date:  01/11/2022   ID:  Brandi [name], DOB Feb 29, 1951, MRN [id number]  PCP:  Marguerita Merles, MD  [facility] Cardiologist:  Nelva Bush, MD  [facility] Electrophysiologist:  None   Referring MD: Marguerita Merles, MD   Chief Complaint: 4 week follow-up  History of Present Illness:    Brandi [name] is a 71 y.o. female with a hx of Brandi [name] is a 71 y.o. female with a hx of  multiple myeloma diagnosed 2018 s/p chemotherapy and autologous stem cell transplant 05/2018 maintenance Revlimid therapy, hypertension, lower extremity edema, COPD, tobacco use, and dyspnea who presents today for follow-up.   She was seen by Dr. Rogue Bussing 08/2019 and Revlimid was held in the setting of lower extremity edema.  TTE 07/2019 with preserved LVEF and normal diastolic function.  PASP at least moderately elevated.  During previous myeloma treatment, she had IJ DVT treated with Xarelto.  She was seen by Dr. Saunders Revel 09/26/2019 with 2 to 3 months worsening lower extremity edema and dyspnea, consistently NYHA class IIIb heart failure. She has been seen regularly in clinic for escalation of GDMT. She is now s/p thyroid removal with improvement in her sx associated with her goiter and abnormal thyroid.  At her last office visit, it was noted she had self reduced her diuresis to every other day.  Her diuresis was transiently increased for 3 days for volume overload.  She was instructed to return to daily Lasix at after that time.  CHF education was provided.   Seen 05/15/21 and was taking lasix 3-4 times a month. Weight was up 139>170lbs. She was started on lasix 27m daily, Toprol, potassium and spironolactone.   Last seen 11/27/20 and HR was 42bpm and Toprol was changed to Coreg.   Today,    Past Medical History:  Diagnosis Date   (HFpEF) heart failure with preserved ejection fraction (HBrutus    a. 06/2020 Echo: EF 60-65%, no rwma, nl RV size/fxn. Triv MR. Triv TR/AI. Mod  elev PASP.   Anxiety    COPD (chronic obstructive pulmonary disease) (HCC)    GERD (gastroesophageal reflux disease)    Hypertension    Hyperthyroidism    Hypokalemia    IgA myeloma (HCC)    Multiple myeloma (HCC)    Pneumonia    Red blood cell antibody positive, compatible PRBC difficult to obtain    S/P autologous bone marrow transplantation ([facility]     Past Surgical History:  Procedure Laterality Date   ANTERIOR VITRECTOMY Left 10/07/2021   Procedure: ANTERIOR VITRECTOMY;  Surgeon: BLeandrew Koyanagi MD;  [place]  Service: Ophthalmology;  Laterality: Left;   CATARACT EXTRACTION W/PHACO Left 10/07/2021   Procedure: CATARACT EXTRACTION PHACO AND INTRAOCULAR LENS PLACEMENT (ICorning LEFT VISION BLUE;  Surgeon: BLeandrew Koyanagi MD;  [place]  Service: Ophthalmology;  Laterality: Left;  kit for manual incision 14.56 01:21.4   CATARACT EXTRACTION W/PHACO Right 10/21/2021   Procedure: CATARACT EXTRACTION PHACO AND INTRAOCULAR LENS PLACEMENT (IOC) RIGHT 11.12 01:48.1;  Surgeon: BLeandrew Koyanagi MD;  [place]  Service: Ophthalmology;  Laterality: Right;   ESOPHAGOGASTRODUODENOSCOPY (EGD) WITH PROPOFOL N/A 03/30/2019   Procedure: ESOPHAGOGASTRODUODENOSCOPY (EGD) WITH PROPOFOL;  Surgeon: AJonathon Bellows MD;  [facility]  Service: Gastroenterology;  Laterality: N/A;   IR FLUORO GUIDE PORT INSERTION RIGHT  12/16/2017   IR IMAGING GUIDED PORT INSERTION  06/15/2021   THYROIDECTOMY N/A     Current  Medications: No outpatient medications have been marked as taking for the 01/11/22 encounter (Appointment) with Kathlen Mody, Marrion Finan H, PA-C.     Allergies:   Patient has no known allergies.   Social History   Socioeconomic History   Marital status: Single    Spouse name: Not on file   Number of children: 3   Years of education: Not on file   Highest education level: Not on file  Occupational History   Not on file   Tobacco Use   Smoking status: Former    Packs/day: 0.25    Types: Cigarettes    Quit date: 03/18/2021    Years since quitting: 0.8   Smokeless tobacco: Never   Tobacco comments:    2 cigarettes QOD  Vaping Use   Vaping Use: Never used  Substance and Sexual Activity   Alcohol use: No   Drug use: No   Sexual activity: Not on file  Other Topics Concern   Not on file  Social History Narrative   Lives at home with family(son): daughter 8 blocks away. States "I'm never alone." Independent at baseline   Social Determinants of Radio broadcast assistant Strain: Not on file  Food Insecurity: Not on file  Transportation Needs: Not on file  Physical Activity: Not on file  Stress: Not on file  Social Connections: Not on file     Family History: The patient's family history includes Pneumonia in her mother; Seizures in her father.  ROS:   Please see the history of present illness.     All other systems reviewed and are negative.  EKGs/Labs/Other Studies Reviewed:    The following studies were reviewed today:  Echo 08/20/19 1. Left ventricular ejection fraction, by visual estimation, is 60 to  65%. The left ventricle has normal function. There is no left ventricular  hypertrophy.   2. The left ventricle has no regional wall motion abnormalities.   3. Global right ventricle has normal systolic function.The right  ventricular size is normal. No increase in right ventricular wall  thickness.   4. Left atrial size was normal.   5. Right atrial size was normal.   6. The mitral valve is normal in structure. Trivial mitral valve  regurgitation.   7. The tricuspid valve is normal in structure.   8. The tricuspid valve is normal in structure. Tricuspid valve  regurgitation is trivial.   9. The aortic valve is normal in structure. Aortic valve regurgitation is  trivial.  10. The pulmonic valve was normal in structure. Pulmonic valve  regurgitation is not visualized.  11.  Moderately elevated pulmonary artery systolic pressure.     EKG:  EKG is *** ordered today.  The ekg ordered today demonstrates ***  Recent Labs: 09/13/2021: B Natriuretic Peptide 48.3 11/26/2021: Magnesium 1.9 12/10/2021: TSH 2.420 12/31/2021: ALT 11; BUN 20; Creatinine, Ser 1.11; Hemoglobin 9.7; Platelets 220; Potassium 4.0; Sodium 136  Recent Lipid Panel No results found for: "CHOL", "TRIG", "HDL", "CHOLHDL", "VLDL", "LDLCALC", "LDLDIRECT"   Risk Assessment/Calculations:   {Does this patient have ATRIAL FIBRILLATION?:[phone]}   Physical Exam:    VS:  There were no vitals taken for this visit.    Wt Readings from Last 3 Encounters:  12/31/21 177 lb 6.4 oz (80.5 kg)  12/17/21 180 lb 5.4 oz (81.8 kg)  12/10/21 180 lb 9.6 oz (81.9 kg)     GEN: *** Well nourished, well developed in no acute distress HEENT: Normal NECK: No JVD; No carotid bruits LYMPHATICS: No  lymphadenopathy CARDIAC: ***RRR, no murmurs, rubs, gallops RESPIRATORY:  Clear to auscultation without rales, wheezing or rhonchi  ABDOMEN: Soft, non-tender, non-distended MUSCULOSKELETAL:  No edema; No deformity  SKIN: Warm and dry NEUROLOGIC:  Alert and oriented x 3 PSYCHIATRIC:  Normal affect   ASSESSMENT:    No diagnosis found. PLAN:    In order of problems listed above:  HFpEF  HTN  Multiple Myeloma/anemia  COPD  Disposition: Follow up {follow up:15908} with ***   Shared Decision Making/Informed Consent   {Are you ordering a CV Procedure (e.g. stress test, cath, DCCV, TEE, etc)?   Press F2        :[id number]}    Signed, Keria Widrig Arlyss Repress  01/11/2022 7:42 AM    [facility]

## 2022-01-12 ENCOUNTER — Encounter: Payer: Self-pay | Admitting: Medical

## 2022-01-14 ENCOUNTER — Inpatient Hospital Stay (HOSPITAL_BASED_OUTPATIENT_CLINIC_OR_DEPARTMENT_OTHER): Payer: Medicare Other | Admitting: Internal Medicine

## 2022-01-14 ENCOUNTER — Inpatient Hospital Stay: Payer: Medicare Other

## 2022-01-14 ENCOUNTER — Telehealth: Payer: Self-pay | Admitting: Internal Medicine

## 2022-01-14 ENCOUNTER — Encounter: Payer: Self-pay | Admitting: Internal Medicine

## 2022-01-14 DIAGNOSIS — J209 Acute bronchitis, unspecified: Secondary | ICD-10-CM | POA: Diagnosis not present

## 2022-01-14 DIAGNOSIS — C9000 Multiple myeloma not having achieved remission: Secondary | ICD-10-CM | POA: Diagnosis not present

## 2022-01-14 DIAGNOSIS — Z5112 Encounter for antineoplastic immunotherapy: Secondary | ICD-10-CM | POA: Diagnosis not present

## 2022-01-14 LAB — COMPREHENSIVE METABOLIC PANEL
ALT: 14 U/L (ref 0–44)
AST: 27 U/L (ref 15–41)
Albumin: 3.7 g/dL (ref 3.5–5.0)
Alkaline Phosphatase: 85 U/L (ref 38–126)
Anion gap: 12 (ref 5–15)
BUN: 13 mg/dL (ref 8–23)
CO2: 19 mmol/L — ABNORMAL LOW (ref 22–32)
Calcium: 8.3 mg/dL — ABNORMAL LOW (ref 8.9–10.3)
Chloride: 106 mmol/L (ref 98–111)
Creatinine, Ser: 1.06 mg/dL — ABNORMAL HIGH (ref 0.44–1.00)
GFR, Estimated: 57 mL/min — ABNORMAL LOW (ref >60)
Glucose, Bld: 147 mg/dL — ABNORMAL HIGH (ref 70–99)
Potassium: 3.7 mmol/L (ref 3.5–5.1)
Sodium: 137 mmol/L (ref 135–145)
Total Bilirubin: 0.5 mg/dL (ref 0.3–1.2)
Total Protein: 8.3 g/dL — ABNORMAL HIGH (ref 6.5–8.1)

## 2022-01-14 LAB — CBC WITH DIFFERENTIAL/PLATELET
Abs Immature Granulocytes: 0.03 10*3/uL (ref 0.00–0.07)
Basophils Absolute: 0 10*3/uL (ref 0.0–0.1)
Basophils Relative: 0 %
Eosinophils Absolute: 0 10*3/uL (ref 0.0–0.5)
Eosinophils Relative: 0 %
HCT: 32.1 % — ABNORMAL LOW (ref 36.0–46.0)
Hemoglobin: 10.1 g/dL — ABNORMAL LOW (ref 12.0–15.0)
Immature Granulocytes: 1 %
Lymphocytes Relative: 29 %
Lymphs Abs: 1.8 10*3/uL (ref 0.7–4.0)
MCH: 29.9 pg (ref 26.0–34.0)
MCHC: 31.5 g/dL (ref 30.0–36.0)
MCV: 95 fL (ref 80.0–100.0)
Monocytes Absolute: 0.1 10*3/uL (ref 0.1–1.0)
Monocytes Relative: 1 %
Neutro Abs: 4.4 10*3/uL (ref 1.7–7.7)
Neutrophils Relative %: 69 %
Platelets: 243 10*3/uL (ref 150–400)
RBC: 3.38 MIL/uL — ABNORMAL LOW (ref 3.87–5.11)
RDW: 15.9 % — ABNORMAL HIGH (ref 11.5–15.5)
Smear Review: NORMAL
WBC: 6.3 10*3/uL (ref 4.0–10.5)
nRBC: 0 % (ref 0.0–0.2)

## 2022-01-14 MED ORDER — DARATUMUMAB-HYALURONIDASE-FIHJ 1800-30000 MG-UT/15ML ~~LOC~~ SOLN
1800.0000 mg | Freq: Once | SUBCUTANEOUS | Status: AC
  Administered 2022-01-14: 1800 mg via SUBCUTANEOUS
  Filled 2022-01-14: qty 15

## 2022-01-14 MED ORDER — ACETAMINOPHEN 325 MG PO TABS
650.0000 mg | ORAL_TABLET | Freq: Once | ORAL | Status: AC
  Administered 2022-01-14: 650 mg via ORAL
  Filled 2022-01-14: qty 2

## 2022-01-14 MED ORDER — GUAIFENESIN-CODEINE 100-10 MG/5ML PO SOLN: 5.0000 mL | Freq: Two times a day (BID) | ORAL | 0 refills | Status: DC | PRN

## 2022-01-14 MED ORDER — DIPHENHYDRAMINE HCL 25 MG PO CAPS: 50.0000 mg | ORAL_CAPSULE | Freq: Once | ORAL | Status: DC

## 2022-01-14 MED ORDER — DEXAMETHASONE 4 MG PO TABS
20.0000 mg | ORAL_TABLET | Freq: Once | ORAL | Status: AC
  Administered 2022-01-14: 20 mg via ORAL
  Filled 2022-01-14: qty 5

## 2022-01-14 MED ORDER — DEXAMETHASONE 4 MG PO TABS: ORAL_TABLET | ORAL | 3 refills | Status: DC

## 2022-01-14 MED ORDER — HEPARIN SOD (PORK) LOCK FLUSH 100 UNIT/ML IV SOLN
500.0000 [IU] | Freq: Once | INTRAVENOUS | Status: AC
  Administered 2022-01-14: 500 [IU] via INTRAVENOUS
  Filled 2022-01-14: qty 5

## 2022-01-14 MED ORDER — DIPHENHYDRAMINE HCL 50 MG/ML IJ SOLN
50.0000 mg | Freq: Once | INTRAMUSCULAR | Status: AC
  Administered 2022-01-14: 50 mg via INTRAVENOUS
  Filled 2022-01-14: qty 1

## 2022-01-14 NOTE — Progress Notes (Unsigned)
Pt states she needs "the little green pill that starts with a d, that she takes with her chemo."  Can she have a refill on the guaifensin?

## 2022-01-14 NOTE — Patient Instructions (Signed)
[facility]  Discharge Instructions: Thank you for choosing [facility] to provide your oncology and hematology care.  If you have a lab appointment with the Cancer Center, please go directly to the Cancer Center and check in at the registration area.  Wear comfortable clothing and clothing appropriate for easy access to any Portacath or PICC line.   We strive to give you quality time with your provider. You may need to reschedule your appointment if you arrive late (15 or more minutes).  Arriving late affects you and other patients whose appointments are after yours.  Also, if you miss three or more appointments without notifying the office, you may be dismissed from the clinic at the provider's discretion.      For prescription refill requests, have your pharmacy contact our office and allow 72 hours for refills to be completed.    Today you received the following chemotherapy and/or immunotherapy agents: Darzalex Faspro      To help prevent nausea and vomiting after your treatment, we encourage you to take your nausea medication as directed.  BELOW ARE SYMPTOMS THAT SHOULD BE REPORTED IMMEDIATELY: *FEVER GREATER THAN 100.4 F (38 C) OR HIGHER *CHILLS OR SWEATING *NAUSEA AND VOMITING THAT IS NOT CONTROLLED WITH YOUR NAUSEA MEDICATION *UNUSUAL SHORTNESS OF BREATH *UNUSUAL BRUISING OR BLEEDING *URINARY PROBLEMS (pain or burning when urinating, or frequent urination) *BOWEL PROBLEMS (unusual diarrhea, constipation, pain near the anus) TENDERNESS IN MOUTH AND THROAT WITH OR WITHOUT PRESENCE OF ULCERS (sore throat, sores in mouth, or a toothache) UNUSUAL RASH, SWELLING OR PAIN  UNUSUAL VAGINAL DISCHARGE OR ITCHING   Items with * indicate a potential emergency and should be followed up as soon as possible or go to the Emergency Department if any problems should occur.  Please show the CHEMOTHERAPY ALERT CARD or IMMUNOTHERAPY ALERT CARD at  check-in to the Emergency Department and triage nurse.  Should you have questions after your visit or need to cancel or reschedule your appointment, please contact [facility]  [phone] and follow the prompts.  Office hours are 8:00 a.m. to 4:30 p.m. Monday - Friday. Please note that voicemails left after 4:00 p.m. may not be returned until the following business day.  We are closed weekends and major holidays. You have access to a nurse at all times for urgent questions. Please call the main number to the clinic [phone] and follow the prompts.  For any non-urgent questions, you may also contact your provider using MyChart. We now offer e-Visits for anyone 18 and older to request care online for non-urgent symptoms. For details visit [email or web address].   Also download the MyChart app! Go to the app store, search "MyChart", open the app, select [facility], and log in with your MyChart username and password.  Masks are optional in the cancer centers. If you would like for your care team to wear a mask while they are taking care of you, please let them know. For doctor visits, patients may have with them one support person who is at least 70 years old. At this time, visitors are not allowed in the infusion area.   

## 2022-01-14 NOTE — Telephone Encounter (Signed)
Varney Biles (patient's daughter) called and got answering service. He mom left her purse in the lobby of cancer center. Has anyone found it? Carollee Sires found it and gave to the patient.

## 2022-01-14 NOTE — Assessment & Plan Note (Signed)
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  Currently recurrent-on salvage therapy.  Stable.  # Patient currently on salvage therapy with daratumumab  lenalidomide [15 mg 3 w; 1-w] dexamethasone.    #Continue cycle #4-day 1 Dara SQ weekly; continue REVLIMID 15 mg again. 3 weeks/1 week off; continue. Labs today reviewed;  acceptable for treatment today.MAY 2023- 0.7 gm; lamda LC= 119. IMPROVING; awaiting labs from today.  # Iatrogenic hypothyroidism [ Graves/goiter s/p total thyroidectomy;aug,2022 ]-  April 2023- TSH-LOW; decrease the dose of Synthroid to 100 mcg/day- MAY 2023- TSH- WNL.  # Muscle cramps-?  Revlimid. improved  On tramadol q 12 hours prn-STABLE.   # Chronic diastolic CHF-clinicallySTABLE.   # Bone lesions /last zometa on 11/27/2017.  Holding Zometa secondary to poor dentition.  Awaiting to speak to dentistry regarding the patient dental extraction.  # IV access: mediport   :MM panle; K/l light chain ratio q 4 W  # DISPOSITION:  #  Dara today #  Follow up in 2 weeks- MD;port- labs-cbc/cmp;- Dara SQ;  Vit D- levels.  Dr.B

## 2022-01-14 NOTE — Progress Notes (Incomplete)
[place] OFFICE PROGRESS NOTE  Patient Care Team: Marguerita Merles, MD as PCP - General (Family Medicine) End, Harrell Gave, MD as PCP - Cardiology (Cardiology) [name], [name] [name], MD as Consulting Physician (Hematology and Oncology) Ottie Glazier, MD as Consulting Physician (Pulmonary Disease) Gabriel Carina Betsey Holiday, MD as Consulting Physician (Endocrinology)   Cancer Staging  No matching staging information was found for the patient.   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 No morphologic support for residual myeloma disease. Negative for minimal residual disease by MM-MRD flow cytometry; HOLD REVLIMID [leg swelling]; AUG 2021-PET scan negative for myeloma; continue to hold Revlimid  # MARCH 2021-diastolic congestive heart failure [Dr.End]  # BMBx- OCT 2021- BMBx- 5% plasmacytosis-however this appears to be more polyclonal rather than monoclonal-not explain patient worsening anemia.   # OCT 2022- 18th- Dara- Rev-Dex  #November 2021-hyperthyroidism/goiter-  [D.Bennett/Dr.Solum]-methimazole. S/p Thyroidectomy [Dr.Kim; [facility] 22952] --------------------------  # 12/12- RIGHT JUGULAR DVT-x 388mn xarelto; finished April 2020; September 2020-EGD/dysphagia; Dr. AnVicente Males# Acute renal failure [Dr.Singh; Proteinuria 1.5gm/day ]; acyclovir/Asprin ------------------------------------------------------------------------------------------------------------   DIAGNOSIS: '[ ]'  MULTIPLE MYELOMA  STAGE: III/HIGH RISK ;GOALS: CONTROL  CURRENT/MOST RECENT THERAPY-maintenance Revlimid- on HOLD    Multiple myeloma not having achieved remission ([facility] 11/21/2017 - 04/28/2018 Chemotherapy   Patient is on Treatment Plan : MYELOMA SALVAGE Cyclophosphamide / Carfilzomib / Dexamethasone (CCd) q28d     05/12/2021 -  Chemotherapy   Patient is on Treatment Plan : MYELOMA RELAPSED REFRACTORY Daratumumab SQ + Lenalidomide + Dexamethasone (DaraRd) q28d      INTERVAL HISTORY: Ambulating independently.  Alone.  VaCELESTINE PRIM89.o.  female pleasant patient above history of relapsed multiple myeloma--currently on daratumumab-Revlimid-dexamethasone  is here for follow-up.  Pt states she needs "the little green pill that starts with a d, that she takes with her chemo."; Can she have a refill on the guaifensin?..[name]  Patient has not had any hospitalizations since last visit. Patient muscle spasm improved since being on tramadol every 12 hours.    Appetite is improving. Currently no fever no chills.  Patient is gaining weight.  No infusion reactions.  Cough improved.   Review of Systems  Constitutional:  Positive for malaise/fatigue. Negative for chills, diaphoresis and fever.  HENT:  Negative for nosebleeds and sore throat.   Eyes:  Negative for double vision.  Respiratory:  Negative for hemoptysis.   Cardiovascular:  Negative for chest pain, palpitations and orthopnea.  Gastrointestinal:  Negative for abdominal pain, blood in stool, constipation, heartburn and melena.   Genitourinary:  Negative for dysuria, frequency and urgency.  Musculoskeletal:  Positive for back pain.  Skin: Negative.  Negative for itching and rash.  Neurological:  Negative for dizziness,  tingling, focal weakness, weakness and headaches.  Endo/Heme/Allergies:  Does not bruise/bleed easily.  Psychiatric/Behavioral:  Negative for depression. The patient is not nervous/anxious.     PAST MEDICAL HISTORY :  Past Medical History:  Diagnosis Date  . (HFpEF) heart failure with preserved ejection fraction ([place])    a. 06/2020 Echo: EF 60-65%, no rwma, nl RV size/fxn. Triv MR. Triv TR/AI. Mod elev PASP.  [name] Anxiety   . COPD (chronic obstructive pulmonary disease) ([place])   . GERD (gastroesophageal reflux disease)   . Hypertension   . Hyperthyroidism   . Hypokalemia   . IgA myeloma ([place])   . Multiple myeloma ([place])   . Pneumonia   . Red blood cell antibody positive, compatible PRBC difficult to obtain   . S/P autologous bone marrow transplantation ([place])     PAST SURGICAL HISTORY :   Past Surgical History:  Procedure Laterality Date  . ANTERIOR VITRECTOMY Left 10/07/2021   Procedure: ANTERIOR VITRECTOMY;  Surgeon: Leandrew Koyanagi, MD;  Location: [place];  Service: Ophthalmology;  Laterality: Left;  . CATARACT EXTRACTION W/PHACO Left 10/07/2021   Procedure: CATARACT EXTRACTION PHACO AND INTRAOCULAR LENS PLACEMENT ([place]) LEFT VISION BLUE;  Surgeon: Leandrew Koyanagi, MD;  Location: [place];  Service: Ophthalmology;  Laterality: Left;  kit for manual incision 14.56 01:21.4  . CATARACT EXTRACTION W/PHACO Right 10/21/2021   Procedure: CATARACT EXTRACTION PHACO AND INTRAOCULAR LENS PLACEMENT ([facility]) RIGHT 11.12 01:48.1;  Surgeon: Leandrew Koyanagi, MD;  Location: [place];  Service: Ophthalmology;  Laterality: Right;  . ESOPHAGOGASTRODUODENOSCOPY (EGD) WITH PROPOFOL N/A 03/30/2019   Procedure: ESOPHAGOGASTRODUODENOSCOPY (EGD) WITH PROPOFOL;  Surgeon:  Jonathon Bellows, MD;  Location: [facility];  Service: Gastroenterology;  Laterality: N/A;  . IR FLUORO GUIDE PORT INSERTION RIGHT  12/16/2017  . IR IMAGING GUIDED PORT INSERTION  06/15/2021  . THYROIDECTOMY N/A     FAMILY HISTORY :   Family History  Problem Relation Age of Onset  . Pneumonia Mother   . Seizures Father     SOCIAL HISTORY:   Social History   Tobacco Use  . Smoking status: Former    Packs/day: 0.25    Types: Cigarettes    Quit date: 03/18/2021    Years since quitting: 0.8  . Smokeless tobacco: Never  . Tobacco comments:    2 cigarettes QOD  Vaping Use  . Vaping Use: Never used  Substance Use Topics  . Alcohol use: No  . Drug use: No    ALLERGIES:  has No Known Allergies.  MEDICATIONS:  Current Outpatient Medications  Medication Sig Dispense Refill  . acyclovir (ZOVIRAX) 400 MG tablet Take 1 tablet (400 mg total) by mouth 2 (two) times daily. 60 tablet 3  . albuterol (VENTOLIN HFA) 108 (90 Base) MCG/ACT inhaler Inhale 2 puffs into the lungs every 6 (six) hours as needed for wheezing or shortness of breath. 8 g 3  . aspirin EC 81 MG EC tablet Take 1 tablet (81 mg total) by mouth daily. 90 tablet 3  . buPROPion (WELLBUTRIN) 75 MG tablet Take 75 mg by mouth daily.    . calcium-vitamin D (OSCAL WITH D) 500MG-200UNIT (5MCG) tablet Take 1 tablet by mouth 2 (two) times daily. 60 tablet 0  . carvedilol (COREG) 3.125 MG tablet Take 1 tablet (3.125 mg total) by mouth 2 (two) times daily with a meal. 60 tablet 5  . clotrimazole-betamethasone (LOTRISONE) cream Apply topically 2 (two) times daily. 30 g 0  . dexamethasone (DECADRON) 4 MG tablet Start  2 days prior to infusion; Take for 2 days. Do not take on the day of infusion. 60 tablet 3  . Ensure Plus (ENSURE PLUS) LIQD Take 237 mLs by mouth 3 (three) times a week.    . furosemide (LASIX) 40 MG tablet Take 1 tablet (40 mg total) by mouth daily. 90 tablet 3  . guaiFENesin-codeine 100-10 MG/5ML syrup Take 5 mLs by mouth  every 12 (twelve) hours as needed for cough. May cause drowsiness. Do not drive with this medication. 120 mL 0  . lenalidomide (REVLIMID) 15 MG capsule Take 1 capsule (15 mg total) by mouth daily. Take for 21 days, then hold for 7 days. Repeat every 28 days. 21 capsule 0  . levothyroxine (SYNTHROID) 100 MCG tablet Take 1 tablet (100 mcg total) by mouth daily before breakfast. 30 tablet 3  . lidocaine-prilocaine (EMLA) cream Apply 1 application topically as needed. Apply to port and cover with saran wrap 1-2 hours prior to port access 30 g 1  . magnesium oxide (MAG-OX) 400 (240 Mg) MG tablet Take 1 tablet (400 mg total) by mouth 2 (two) times daily. 60 tablet 3  . montelukast (SINGULAIR) 10 MG tablet Take 1 tablet (10 mg total) by mouth at bedtime. 30 tablet 3  . ondansetron (ZOFRAN) 8 MG tablet One pill every 8 hours as needed for nausea/vomitting. 40 tablet 1  . pantoprazole (PROTONIX) 40 MG tablet Take 1 tablet by mouth once daily 90 tablet 0  . polyethylene glycol (MIRALAX / GLYCOLAX) packet Take 17 g by mouth daily as needed for mild constipation. 14 each 0  . potassium chloride SA (KLOR-CON) 20 MEQ tablet Take 1 tablet (20 mEq total) by mouth 2 (two) times daily. 60 tablet 6  . spironolactone (ALDACTONE) 25 MG tablet Take 1 tablet (25 mg total) by mouth daily. 90 tablet 0  . traMADol (ULTRAM) 50 MG tablet Take 1 tablet (50 mg total) by mouth every 12 (twelve) hours as needed. 60 tablet 0  . Vitamin D, Ergocalciferol, (DRISDOL) 1.25 MG (50000 UNIT) CAPS capsule Take 1 capsule by mouth once a week 12 capsule 0   No current facility-administered medications for this visit.   Facility-Administered Medications Ordered in Other Visits  Medication Dose Route Frequency Provider Last Rate Last Admin  . 0.9 %  sodium chloride infusion   Intravenous Continuous Monia Sabal, PA-C        PHYSICAL EXAMINATION: ECOG PERFORMANCE STATUS: 1 - Symptomatic but completely ambulatory  BP 140/70 (BP  Location: Right Arm, Patient Position: Sitting, Cuff Size: Normal)   Pulse (!) 55   Temp 98.8 F (37.1 C) (Tympanic)   Ht '5\' 2"'  (1.575 m)   Wt 178 lb 3.2 oz (80.8 kg)   SpO2 100%   BMI 32.59 kg/m   Filed Weights   01/14/22 0952  Weight: 178 lb 3.2 oz (80.8 kg)     Physical Exam HENT:     Head: Normocephalic and atraumatic.  Eyes:     Pupils: Pupils are equal, round, and reactive to light.  Cardiovascular:     Rate and Rhythm: Normal rate and regular rhythm.  Pulmonary:     Effort: Pulmonary effort is normal. No respiratory distress.     Breath sounds: Normal breath sounds. No wheezing.  Abdominal:     General: Bowel sounds are normal. There is no distension.     Palpations: Abdomen is soft. There is no mass.     Tenderness: There is no abdominal tenderness. There is  no guarding or rebound.  Musculoskeletal:        General: No tenderness. Normal range of motion.     Cervical back: Normal range of motion and neck supple.  Skin:    General: Skin is warm.  Neurological:     Mental Status: She is alert and oriented to person, place, and time.  Psychiatric:        Mood and Affect: Affect normal.   LABORATORY DATA:  I have reviewed the data as listed    Component Value Date/Time   NA 137 01/14/2022 0931   NA 141 01/25/2020 1530   K 3.7 01/14/2022 0931   CL 106 01/14/2022 0931   CO2 19 (L) 01/14/2022 0931   GLUCOSE 147 (H) 01/14/2022 0931   BUN 13 01/14/2022 0931   BUN 19 01/25/2020 1530   CREATININE 1.06 (H) 01/14/2022 0931   CALCIUM 8.3 (L) 01/14/2022 0931   PROT 8.3 (H) 01/14/2022 0931   ALBUMIN 3.7 01/14/2022 0931   AST 27 01/14/2022 0931   ALT 14 01/14/2022 0931   ALKPHOS 85 01/14/2022 0931   BILITOT 0.5 01/14/2022 0931   GFRNONAA 57 (L) 01/14/2022 0931   GFRAA NOT CALCULATED 04/17/2020 1352    No results found for: "SPEP", "UPEP"  Lab Results  Component Value Date   WBC 6.3 01/14/2022   NEUTROABS PENDING 01/14/2022   HGB 10.1 (L) 01/14/2022   HCT  32.1 (L) 01/14/2022   MCV 95.0 01/14/2022   PLT 243 01/14/2022      Chemistry      Component Value Date/Time   NA 137 01/14/2022 0931   NA 141 01/25/2020 1530   K 3.7 01/14/2022 0931   CL 106 01/14/2022 0931   CO2 19 (L) 01/14/2022 0931   BUN 13 01/14/2022 0931   BUN 19 01/25/2020 1530   CREATININE 1.06 (H) 01/14/2022 0931      Component Value Date/Time   CALCIUM 8.3 (L) 01/14/2022 0931   ALKPHOS 85 01/14/2022 0931   AST 27 01/14/2022 0931   ALT 14 01/14/2022 0931   BILITOT 0.5 01/14/2022 0931      (H): Data is abnormally high   Latest Reference Range & Units Most Recent 12/24/20 14:01 04/01/21 14:34 04/28/21 09:17 06/11/21 09:42 07/03/21 10:32 08/20/21 10:06  M Protein SerPl Elph-Mcnc Not Observed g/dL 1.1 (H) (C) 08/20/21 10:06 Not Observed (C) 0.6 (H) (C) 0.7 (H) (C) 0.5 (H) (C) 0.7 (H) (C) 1.1 (H) (C)    Latest Reference Range & Units Most Recent 09/17/21 08:47 10/15/21 08:25 11/12/21 08:55  Kappa free light chain 3.3 - 19.4 mg/L 5.0 11/12/21 08:55 7.2 7.2 5.0  Lambda free light chains 5.7 - 26.3 mg/L 223.6 (H) 11/12/21 08:55 215.3 (H) 340.2 (H) 223.6 (H)  Kappa, lambda light chain ratio 0.26 - 1.65  0.02 (L) 11/12/21 08:55 0.03 (L) 0.02 (L) 0.02 (L)  (H): Data is abnormally high (L): Data is abnormally low  RADIOGRAPHIC STUDIES: I have personally reviewed the radiological images as listed and agreed with the findings in the report. No results found.   ASSESSMENT & PLAN:  No problem-specific Assessment & Plan notes found for this encounter.   No orders of the defined types were placed in this encounter.    All questions were answered. The patient knows to call the clinic with any problems, questions or concerns.      [name], MD 01/14/2022 10:13 AM

## 2022-01-15 ENCOUNTER — Encounter: Payer: Self-pay | Admitting: Internal Medicine

## 2022-01-15 LAB — KAPPA/LAMBDA LIGHT CHAINS
Kappa free light chain: 6.9 mg/L (ref 3.3–19.4)
Kappa, lambda light chain ratio: 0.05 — ABNORMAL LOW (ref 0.26–1.65)
Lambda free light chains: 148.2 mg/L — ABNORMAL HIGH (ref 5.7–26.3)

## 2022-01-16 ENCOUNTER — Other Ambulatory Visit: Payer: Self-pay | Admitting: Medical

## 2022-01-20 LAB — MULTIPLE MYELOMA PANEL, SERUM
Albumin SerPl Elph-Mcnc: 3.7 g/dL (ref 2.9–4.4)
Albumin/Glob SerPl: 1 (ref 0.7–1.7)
Alpha 1: 0.2 g/dL (ref 0.0–0.4)
Alpha2 Glob SerPl Elph-Mcnc: 0.8 g/dL (ref 0.4–1.0)
B-Globulin SerPl Elph-Mcnc: 1.6 g/dL — ABNORMAL HIGH (ref 0.7–1.3)
Gamma Glob SerPl Elph-Mcnc: 1.3 g/dL (ref 0.4–1.8)
Globulin, Total: 3.9 g/dL (ref 2.2–3.9)
IgA: 1132 mg/dL — ABNORMAL HIGH (ref 87–352)
IgG (Immunoglobin G), Serum: 515 mg/dL — ABNORMAL LOW (ref 586–1602)
IgM (Immunoglobulin M), Srm: 11 mg/dL — ABNORMAL LOW (ref 26–217)
M Protein SerPl Elph-Mcnc: 0.8 g/dL — ABNORMAL HIGH
Total Protein ELP: 7.6 g/dL (ref 6.0–8.5)

## 2022-01-25 ENCOUNTER — Other Ambulatory Visit: Payer: Self-pay

## 2022-01-25 DIAGNOSIS — C9002 Multiple myeloma in relapse: Secondary | ICD-10-CM

## 2022-01-25 NOTE — Telephone Encounter (Signed)
Refill request for revlemid. Auth:  [id number] for 30 days Obtained: 01/25/2022

## 2022-01-27 ENCOUNTER — Other Ambulatory Visit: Payer: Self-pay | Admitting: Internal Medicine

## 2022-01-27 ENCOUNTER — Encounter: Payer: Self-pay | Admitting: Internal Medicine

## 2022-01-27 MED ORDER — LENALIDOMIDE 15 MG PO CAPS: 15.0000 mg | ORAL_CAPSULE | Freq: Every day | ORAL | 0 refills | Status: DC

## 2022-01-28 ENCOUNTER — Encounter: Payer: Self-pay | Admitting: Internal Medicine

## 2022-01-28 ENCOUNTER — Other Ambulatory Visit: Payer: Self-pay | Admitting: Internal Medicine

## 2022-01-28 ENCOUNTER — Other Ambulatory Visit: Payer: Self-pay

## 2022-01-28 ENCOUNTER — Inpatient Hospital Stay (HOSPITAL_BASED_OUTPATIENT_CLINIC_OR_DEPARTMENT_OTHER): Payer: Medicare Other | Admitting: Internal Medicine

## 2022-01-28 ENCOUNTER — Inpatient Hospital Stay: Payer: Medicare Other | Attending: Nurse Practitioner

## 2022-01-28 ENCOUNTER — Inpatient Hospital Stay: Payer: Medicare Other

## 2022-01-28 DIAGNOSIS — Z5112 Encounter for antineoplastic immunotherapy: Secondary | ICD-10-CM | POA: Diagnosis present

## 2022-01-28 DIAGNOSIS — C9002 Multiple myeloma in relapse: Secondary | ICD-10-CM

## 2022-01-28 DIAGNOSIS — E559 Vitamin D deficiency, unspecified: Secondary | ICD-10-CM | POA: Diagnosis not present

## 2022-01-28 DIAGNOSIS — Z79899 Other long term (current) drug therapy: Secondary | ICD-10-CM | POA: Insufficient documentation

## 2022-01-28 DIAGNOSIS — C9000 Multiple myeloma not having achieved remission: Secondary | ICD-10-CM

## 2022-01-28 DIAGNOSIS — Z79890 Hormone replacement therapy (postmenopausal): Secondary | ICD-10-CM | POA: Insufficient documentation

## 2022-01-28 DIAGNOSIS — Z9481 Bone marrow transplant status: Secondary | ICD-10-CM | POA: Diagnosis not present

## 2022-01-28 DIAGNOSIS — Z9484 Stem cells transplant status: Secondary | ICD-10-CM | POA: Diagnosis not present

## 2022-01-28 DIAGNOSIS — Z7961 Long term (current) use of immunomodulator: Secondary | ICD-10-CM | POA: Insufficient documentation

## 2022-01-28 LAB — COMPREHENSIVE METABOLIC PANEL
ALT: 14 U/L (ref 0–44)
AST: 21 U/L (ref 15–41)
Albumin: 3.9 g/dL (ref 3.5–5.0)
Alkaline Phosphatase: 77 U/L (ref 38–126)
Anion gap: 11 (ref 5–15)
BUN: 16 mg/dL (ref 8–23)
CO2: 22 mmol/L (ref 22–32)
Calcium: 8.3 mg/dL — ABNORMAL LOW (ref 8.9–10.3)
Chloride: 104 mmol/L (ref 98–111)
Creatinine, Ser: 0.98 mg/dL (ref 0.44–1.00)
GFR, Estimated: >60 mL/min (ref >60)
Glucose, Bld: 129 mg/dL — ABNORMAL HIGH (ref 70–99)
Potassium: 3.4 mmol/L — ABNORMAL LOW (ref 3.5–5.1)
Sodium: 137 mmol/L (ref 135–145)
Total Bilirubin: 0.8 mg/dL (ref 0.3–1.2)
Total Protein: 8.3 g/dL — ABNORMAL HIGH (ref 6.5–8.1)

## 2022-01-28 LAB — CBC WITH DIFFERENTIAL/PLATELET
Abs Immature Granulocytes: 0.02 10*3/uL (ref 0.00–0.07)
Basophils Absolute: 0 10*3/uL (ref 0.0–0.1)
Basophils Relative: 0 %
Eosinophils Absolute: 0 10*3/uL (ref 0.0–0.5)
Eosinophils Relative: 0 %
HCT: 31.1 % — ABNORMAL LOW (ref 36.0–46.0)
Hemoglobin: 10 g/dL — ABNORMAL LOW (ref 12.0–15.0)
Immature Granulocytes: 0 %
Lymphocytes Relative: 32 %
Lymphs Abs: 1.4 10*3/uL (ref 0.7–4.0)
MCH: 30 pg (ref 26.0–34.0)
MCHC: 32.2 g/dL (ref 30.0–36.0)
MCV: 93.4 fL (ref 80.0–100.0)
Monocytes Absolute: 0.1 10*3/uL (ref 0.1–1.0)
Monocytes Relative: 2 %
Neutro Abs: 3 10*3/uL (ref 1.7–7.7)
Neutrophils Relative %: 66 %
Platelets: 244 10*3/uL (ref 150–400)
RBC: 3.33 MIL/uL — ABNORMAL LOW (ref 3.87–5.11)
RDW: 16 % — ABNORMAL HIGH (ref 11.5–15.5)
WBC: 4.5 10*3/uL (ref 4.0–10.5)
nRBC: 0 % (ref 0.0–0.2)

## 2022-01-28 LAB — VITAMIN D 25 HYDROXY (VIT D DEFICIENCY, FRACTURES): Vit D, 25-Hydroxy: 42.83 ng/mL (ref 30–100)

## 2022-01-28 MED ORDER — ACETAMINOPHEN 325 MG PO TABS
650.0000 mg | ORAL_TABLET | Freq: Once | ORAL | Status: AC
  Administered 2022-01-28: 650 mg via ORAL
  Filled 2022-01-28: qty 2

## 2022-01-28 MED ORDER — DIPHENHYDRAMINE HCL 25 MG PO CAPS
50.0000 mg | ORAL_CAPSULE | Freq: Once | ORAL | Status: AC
  Administered 2022-01-28: 50 mg via ORAL
  Filled 2022-01-28: qty 2

## 2022-01-28 MED ORDER — DARATUMUMAB-HYALURONIDASE-FIHJ 1800-30000 MG-UT/15ML ~~LOC~~ SOLN
1800.0000 mg | Freq: Once | SUBCUTANEOUS | Status: AC
  Administered 2022-01-28: 1800 mg via SUBCUTANEOUS
  Filled 2022-01-28: qty 15

## 2022-01-28 MED ORDER — LENALIDOMIDE 15 MG PO CAPS: 15.0000 mg | ORAL_CAPSULE | Freq: Every day | ORAL | 0 refills | Status: DC

## 2022-01-28 MED ORDER — DEXAMETHASONE 4 MG PO TABS
20.0000 mg | ORAL_TABLET | Freq: Once | ORAL | Status: AC
  Administered 2022-01-28: 20 mg via ORAL
  Filled 2022-01-28: qty 5

## 2022-01-28 NOTE — Assessment & Plan Note (Addendum)
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  Currently recurrent-on salvage therapy. STABLE.   # Patient currently on salvage therapy with daratumumab  lenalidomide [15 mg 3 w; 1-w] dexamethasone.    # Continue cycle #4-day 1 Dara SQ weekly; continue REVLIMID 15 mg again. 3 weeks/1 week off; continue. Labs today reviewed;  acceptable for treatment today. JUNE 2023- 0.8 gm; lamda LC= 148 IMPROVING; awaiting labs from today.  # Iatrogenic hypothyroidism [ Graves/goiter s/p total thyroidectomy;aug,2022 ]-  April 2023- TSH-LOW; decrease the dose of Synthroid to 100 mcg/day- MAY 2023- TSH- WNL.STABLE.  # Muscle cramps-?  Revlimid. improved  On tramadol q 12 hours prn-STABLE.   # Chronic diastolic CHF-clinicallySTABLE.   # Bone lesions /last zometa on 11/27/2017.  Holding Zometa secondary to poor dentition.  Awaiting to speak to dentistry regarding the patient dental extraction.  # IV access: mediport   :MM panel; K/l light chain ratio q 4 W  # DISPOSITION:  #  Dara today #  Follow up in 2 weeks- MD;port- labs-cbc/cmp;- Dara SQ;  Vit D- levels; MM panel; K/l light chain.  Dr.B

## 2022-01-28 NOTE — Progress Notes (Signed)
[facility] OFFICE PROGRESS NOTE  Patient Care Team: Marguerita Merles, MD as PCP - General (Family Medicine) End, Harrell Gave, MD as PCP - Cardiology (Cardiology) [name], [name] [name], MD as Consulting Physician (Hematology and Oncology) Ottie Glazier, MD as Consulting Physician (Pulmonary Disease) Gabriel Carina Betsey Holiday, MD as Consulting Physician (Endocrinology)   Cancer Staging  No matching staging information was found for the patient.   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 No morphologic support for residual myeloma disease. Negative for minimal residual disease by MM-MRD flow cytometry; HOLD REVLIMID [leg swelling]; AUG 2021-PET scan negative for myeloma; continue to hold Revlimid  # MARCH 2021-diastolic congestive heart failure [Dr.End]  # BMBx- OCT 2021- BMBx- 5% plasmacytosis-however this appears to be more polyclonal rather than monoclonal-not explain patient worsening anemia.   # OCT 2022- 18th- Dara- Rev-Dex  #November 2021-hyperthyroidism/goiter-  [D.Bennett/Dr.Solum]-methimazole. S/p Thyroidectomy [Dr.Kim; [facility]] --------------------------  # 12/12- RIGHT JUGULAR DVT-x 362mn xarelto; finished April 2020; September 2020-EGD/dysphagia; Dr. AnVicente Males# Acute renal failure [Dr.Singh; Proteinuria 1.5gm/day ]; acyclovir/Asprin ------------------------------------------------------------------------------------------------------------   DIAGNOSIS: [ ] MULTIPLE MYELOMA  STAGE: III/HIGH RISK ;GOALS: CONTROL  CURRENT/MOST RECENT THERAPY-maintenance Revlimid- on HOLD    Multiple myeloma not having achieved remission ([facility] 11/21/2017 - 04/28/2018 Chemotherapy   Patient is on Treatment Plan : MYELOMA SALVAGE Cyclophosphamide / Carfilzomib / Dexamethasone (CCd) q28d     05/12/2021 -  Chemotherapy   Patient is on Treatment Plan : MYELOMA RELAPSED REFRACTORY Daratumumab SQ + Lenalidomide + Dexamethasone (DaraRd) q28d      INTERVAL HISTORY: Ambulating independently.  Alone.  [name].o.  female pleasant patient above history of relapsed multiple myeloma--currently on daratumumab-Revlimid-dexamethasone  is here for follow-up.  Patient has not had any hospitalizations.  Appetite is good but no weight loss.  No nausea no vomiting.  Chronic mild muscle spasms needing tramadol every 12 hours also.  Ongoing cough not any worse.  Continues to be needing guaifenesin.  Review of Systems  Constitutional:  Positive for malaise/fatigue. Negative for chills, diaphoresis and fever.  HENT:  Negative for nosebleeds and sore throat.   Eyes:  Negative for double vision.  Respiratory:  Negative for hemoptysis.   Cardiovascular:  Negative for chest pain, palpitations and orthopnea.  Gastrointestinal:  Negative for abdominal pain, blood in stool, constipation, heartburn and melena.  Genitourinary:  Negative for dysuria, frequency and urgency.  Musculoskeletal:  Positive for back pain.  Skin: Negative.  Negative for itching and rash.   Neurological:  Negative for dizziness, tingling, focal weakness, weakness and headaches.  Endo/Heme/Allergies:  Does not bruise/bleed easily.  Psychiatric/Behavioral:  Negative for depression. The patient is not nervous/anxious.     PAST MEDICAL HISTORY :  Past Medical History:  Diagnosis Date  . (HFpEF) heart failure with preserved ejection fraction ([place])    a. 06/2020 Echo: EF 60-65%, no rwma, nl RV size/fxn. Triv MR. Triv TR/AI. Mod elev PASP.  [name] Anxiety   . COPD (chronic obstructive pulmonary disease) ([place])   . GERD (gastroesophageal reflux disease)   . Hypertension   . Hyperthyroidism   . Hypokalemia   . IgA myeloma ([place])   . Multiple myeloma ([place])   . Pneumonia   . Red blood cell antibody positive, compatible PRBC difficult to obtain   . S/P autologous bone marrow transplantation ([place])     PAST SURGICAL HISTORY :   Past Surgical History:  Procedure Laterality Date  . ANTERIOR VITRECTOMY Left 10/07/2021   Procedure: ANTERIOR VITRECTOMY;  Surgeon: Leandrew Koyanagi, MD;  Location: [facility];  Service: Ophthalmology;  Laterality: Left;  . CATARACT EXTRACTION W/PHACO Left 10/07/2021   Procedure: CATARACT EXTRACTION PHACO AND INTRAOCULAR LENS PLACEMENT ([place]) LEFT VISION BLUE;  Surgeon: Leandrew Koyanagi, MD;  Location: [facility];  Service: Ophthalmology;  Laterality: Left;  kit for manual incision 14.56 01:21.4  . CATARACT EXTRACTION W/PHACO Right 10/21/2021   Procedure: CATARACT EXTRACTION PHACO AND INTRAOCULAR LENS PLACEMENT (IOC) RIGHT 11.12 01:48.1;  Surgeon: Leandrew Koyanagi, MD;  Location: [facility];  Service: Ophthalmology;  Laterality: Right;  . ESOPHAGOGASTRODUODENOSCOPY (EGD) WITH PROPOFOL N/A 03/30/2019   Procedure: ESOPHAGOGASTRODUODENOSCOPY (EGD) WITH PROPOFOL;  Surgeon: Jonathon Bellows, MD;  Location: [facility] ENDOSCOPY;  Service: Gastroenterology;  Laterality: N/A;  . IR FLUORO GUIDE PORT INSERTION RIGHT  12/16/2017  . IR IMAGING  GUIDED PORT INSERTION  06/15/2021  . THYROIDECTOMY N/A     FAMILY HISTORY :   Family History  Problem Relation Age of Onset  . Pneumonia Mother   . Seizures Father     SOCIAL HISTORY:   Social History   Tobacco Use  . Smoking status: Former    Packs/day: 0.25    Types: Cigarettes    Quit date: 03/18/2021    Years since quitting: 0.8  . Smokeless tobacco: Never  . Tobacco comments:    2 cigarettes QOD  Vaping Use  . Vaping Use: Never used  Substance Use Topics  . Alcohol use: No  . Drug use: No    ALLERGIES:  has No Known Allergies.  MEDICATIONS:  Current Outpatient Medications  Medication Sig Dispense Refill  . acyclovir (ZOVIRAX) 400 MG tablet Take 1 tablet (400 mg total) by mouth 2 (two) times daily. 60 tablet 3  . albuterol (VENTOLIN HFA) 108 (90 Base) MCG/ACT inhaler Inhale 2 puffs into the lungs every 6 (six) hours as needed for wheezing or shortness of breath. 8 g 3  . aspirin EC 81 MG EC tablet Take 1 tablet (81 mg total) by mouth daily. 90 tablet 3  . buPROPion (WELLBUTRIN) 75 MG tablet Take 75 mg by mouth daily.    . calcium-vitamin D (OSCAL WITH D) 500MG-200UNIT (5MCG) tablet Take 1 tablet by mouth 2 (two) times daily. 60 tablet 0  . carvedilol (COREG) 3.125 MG tablet Take 1 tablet (3.125 mg total) by mouth 2 (two) times daily with a meal. 60 tablet 5  . clotrimazole-betamethasone (LOTRISONE) cream Apply topically 2 (two) times daily. 30 g 0  . dexamethasone (DECADRON) 4 MG tablet Start 2 days prior to infusion; Take for 2 days. Do not take on the day of infusion. 60 tablet 3  . Ensure Plus (ENSURE PLUS) LIQD Take 237 mLs by mouth 3 (  three) times a week.    . furosemide (LASIX) 40 MG tablet Take 1 tablet (40 mg total) by mouth daily. 90 tablet 3  . guaiFENesin-codeine 100-10 MG/5ML syrup Take 5 mLs by mouth every 12 (twelve) hours as needed for cough. May cause drowsiness. Do not drive with this medication. 120 mL 0  . lenalidomide (REVLIMID) 15 MG capsule Take  1 capsule (15 mg total) by mouth daily. Take for 21 days, then hold for 7 days. Repeat every 28 days. 21 capsule 0  . levothyroxine (SYNTHROID) 100 MCG tablet Take 1 tablet (100 mcg total) by mouth daily before breakfast. 30 tablet 3  . lidocaine-prilocaine (EMLA) cream Apply 1 application topically as needed. Apply to port and cover with saran wrap 1-2 hours prior to port access 30 g 1  . magnesium oxide (MAG-OX) 400 (240 Mg) MG tablet Take 1 tablet (400 mg total) by mouth 2 (two) times daily. 60 tablet 3  . montelukast (SINGULAIR) 10 MG tablet Take 1 tablet (10 mg total) by mouth at bedtime. 30 tablet 3  . ondansetron (ZOFRAN) 8 MG tablet One pill every 8 hours as needed for nausea/vomitting. 40 tablet 1  . pantoprazole (PROTONIX) 40 MG tablet Take 1 tablet by mouth once daily 90 tablet 0  . polyethylene glycol (MIRALAX / GLYCOLAX) packet Take 17 g by mouth daily as needed for mild constipation. 14 each 0  . potassium chloride SA (KLOR-CON) 20 MEQ tablet Take 1 tablet (20 mEq total) by mouth 2 (two) times daily. 60 tablet 6  . spironolactone (ALDACTONE) 25 MG tablet Take 1 tablet by mouth once daily 90 tablet 0  . traMADol (ULTRAM) 50 MG tablet Take 1 tablet (50 mg total) by mouth every 12 (twelve) hours as needed. 60 tablet 0  . Vitamin D, Ergocalciferol, (DRISDOL) 1.25 MG (50000 UNIT) CAPS capsule Take 1 capsule by mouth once a week 12 capsule 0   No current facility-administered medications for this visit.   Facility-Administered Medications Ordered in Other Visits  Medication Dose Route Frequency Provider Last Rate Last Admin  . 0.9 %  sodium chloride infusion   Intravenous Continuous Monia Sabal, PA-C        PHYSICAL EXAMINATION: ECOG PERFORMANCE STATUS: 1 - Symptomatic but completely ambulatory  BP (!) 142/85 (BP Location: Left Arm, Patient Position: Sitting, Cuff Size: Normal)   Pulse (!) 49   Temp 98.8 F (37.1 C) (Tympanic)   Ht 5' 2" (1.575 m)   Wt 178 lb 12.8 oz (81.1 kg)    SpO2 100%   BMI 32.70 kg/m   Filed Weights   01/28/22 0950  Weight: 178 lb 12.8 oz (81.1 kg)     Physical Exam HENT:     Head: Normocephalic and atraumatic.  Eyes:     Pupils: Pupils are equal, round, and reactive to light.  Cardiovascular:     Rate and Rhythm: Normal rate and regular rhythm.  Pulmonary:     Effort: Pulmonary effort is normal. No respiratory distress.     Breath sounds: Normal breath sounds. No wheezing.  Abdominal:     General: Bowel sounds are normal. There is no distension.     Palpations: Abdomen is soft. There is no mass.     Tenderness: There is no abdominal tenderness. There is no guarding or rebound.  Musculoskeletal:        General: No tenderness. Normal range of motion.     Cervical back: Normal range of motion and neck supple.  Skin:    General: Skin is warm.  Neurological:     Mental Status: She is alert and oriented to person, place, and time.  Psychiatric:        Mood and Affect: Affect normal.   LABORATORY DATA:  I have reviewed the data as listed    Component Value Date/Time   NA 137 01/28/2022 0932   NA 141 01/25/2020 1530   K 3.4 (L) 01/28/2022 0932   CL 104 01/28/2022 0932   CO2 22 01/28/2022 0932   GLUCOSE 129 (H) 01/28/2022 0932   BUN 16 01/28/2022 0932   BUN 19 01/25/2020 1530   CREATININE 0.98 01/28/2022 0932   CALCIUM 8.3 (L) 01/28/2022 0932   PROT 8.3 (H) 01/28/2022 0932   ALBUMIN 3.9 01/28/2022 0932   AST 21 01/28/2022 0932   ALT 14 01/28/2022 0932   ALKPHOS 77 01/28/2022 0932   BILITOT 0.8 01/28/2022 0932   GFRNONAA >60 01/28/2022 0932   GFRAA NOT CALCULATED 04/17/2020 1352    No results found for: "SPEP", "UPEP"  Lab Results  Component Value Date   WBC 4.5 01/28/2022   NEUTROABS 3.0 01/28/2022   HGB 10.0 (L) 01/28/2022   HCT 31.1 (L) 01/28/2022   MCV 93.4 01/28/2022   PLT 244 01/28/2022      Chemistry      Component Value Date/Time   NA 137 01/28/2022 0932   NA 141 01/25/2020 1530   K 3.4 (L)  01/28/2022 0932   CL 104 01/28/2022 0932   CO2 22 01/28/2022 0932   BUN 16 01/28/2022 0932   BUN 19 01/25/2020 1530   CREATININE 0.98 01/28/2022 0932      Component Value Date/Time   CALCIUM 8.3 (L) 01/28/2022 0932   ALKPHOS 77 01/28/2022 0932   AST 21 01/28/2022 0932   ALT 14 01/28/2022 0932   BILITOT 0.8 01/28/2022 0932      (H): Data is abnormally high   Latest Reference Range & Units Most Recent 12/24/20 14:01 04/01/21 14:34 04/28/21 09:17 06/11/21 09:42 07/03/21 10:32 08/20/21 10:06  M Protein SerPl Elph-Mcnc Not Observed g/dL 1.1 (H) (C) 08/20/21 10:06 Not Observed (C) 0.6 (H) (C) 0.7 (H) (C) 0.5 (H) (C) 0.7 (H) (C) 1.1 (H) (C)    Latest Reference Range & Units Most Recent 09/17/21 08:47 10/15/21 08:25 11/12/21 08:55  Kappa free light chain 3.3 - 19.4 mg/L 5.0 11/12/21 08:55 7.2 7.2 5.0  Lambda free light chains 5.7 - 26.3 mg/L 223.6 (H) 11/12/21 08:55 215.3 (H) 340.2 (H) 223.6 (H)  Kappa, lambda light chain ratio 0.26 - 1.65  0.02 (L) 11/12/21 08:55 0.03 (L) 0.02 (L) 0.02 (L)  (H): Data is abnormally high (L): Data is abnormally low  RADIOGRAPHIC STUDIES: I have personally reviewed the radiological images as listed and agreed with the findings in the report. No results found.   ASSESSMENT & PLAN:  Multiple myeloma not having achieved remission ([place]) # RECURRENT Multiple myeloma stage III [high-risk cytogenetics-status post KRD-VGPR ; status post autologous stem cell transplant on 06/15/18.  Currently recurrent-on salvage therapy. STABLE.   # Patient currently on salvage therapy with daratumumab  lenalidomide [15 mg 3 w; 1-w] dexamethasone.    # Continue cycle #4-day 1 Dara SQ weekly; continue REVLIMID 15 mg again. 3 weeks/1 week off; continue. Labs today reviewed;  acceptable for treatment today. JUNE 2023- 0.8 gm; lamda LC= 148 IMPROVING; awaiting labs from today.  # Iatrogenic hypothyroidism [ Graves/goiter s/p total thyroidectomy;aug,2022 ]-  April 2023- TSH-LOW;  decrease the dose of Synthroid to 100 mcg/day- MAY 2023- TSH- WNL.STABLE.  # Muscle cramps-?  Revlimid. improved  On tramadol q 12 hours prn-STABLE.   # Chronic diastolic CHF-clinicallySTABLE.   # Bone lesions /last zometa on 11/27/2017.  Holding Zometa secondary to poor dentition.  Awaiting to speak to dentistry regarding the patient dental extraction.  # IV access: mediport   :MM panel; K/l light chain ratio q 4 W  # DISPOSITION:  #  Dara today #  Follow up in 2 weeks- MD;port- labs-cbc/cmp;- Dara SQ;  Vit D- levels; MM panel; K/l light chain.  Dr.B     Orders Placed This Encounter  Procedures  . Kappa/lambda light chains    Standing Status:   Future    Standing Expiration Date:   01/29/2023  . Multiple Myeloma Panel (SPEP&IFE w/QIG)    Standing Status:   Future    Standing Expiration Date:   01/29/2023  . CBC with Differential/Platelet    Standing Status:   Future    Standing Expiration Date:   01/29/2023  . Comprehensive metabolic panel    Standing Status:   Future    Standing Expiration Date:   01/29/2023  . VITAMIN D 25 Hydroxy (Vit-D Deficiency, Fractures)    Standing Status:   Future    Standing Expiration Date:   01/29/2023     All questions were answered. The patient knows to call the clinic with any problems, questions or concerns.      [name], MD 01/28/2022 12:11 PM

## 2022-01-28 NOTE — Progress Notes (Signed)
Pt states she needs a refill on the rx she takes before her treatments. She doesn't know the name of it.

## 2022-01-28 NOTE — Addendum Note (Signed)
Addended by: Vanice Sarah on: 01/28/2022 01:56 PM   Modules accepted: Orders

## 2022-01-28 NOTE — Patient Instructions (Signed)
[facility]  Discharge Instructions: Thank you for choosing [facility] to provide your oncology and hematology care.  If you have a lab appointment with the Cancer Center, please go directly to the Cancer Center and check in at the registration area.  Wear comfortable clothing and clothing appropriate for easy access to any Portacath or PICC line.   We strive to give you quality time with your provider. You may need to reschedule your appointment if you arrive late (15 or more minutes).  Arriving late affects you and other patients whose appointments are after yours.  Also, if you miss three or more appointments without notifying the office, you may be dismissed from the clinic at the provider's discretion.      For prescription refill requests, have your pharmacy contact our office and allow 72 hours for refills to be completed.    Today you received the following chemotherapy and/or immunotherapy agents Darzalex      To help prevent nausea and vomiting after your treatment, we encourage you to take your nausea medication as directed.  BELOW ARE SYMPTOMS THAT SHOULD BE REPORTED IMMEDIATELY: *FEVER GREATER THAN 100.4 F (38 C) OR HIGHER *CHILLS OR SWEATING *NAUSEA AND VOMITING THAT IS NOT CONTROLLED WITH YOUR NAUSEA MEDICATION *UNUSUAL SHORTNESS OF BREATH *UNUSUAL BRUISING OR BLEEDING *URINARY PROBLEMS (pain or burning when urinating, or frequent urination) *BOWEL PROBLEMS (unusual diarrhea, constipation, pain near the anus) TENDERNESS IN MOUTH AND THROAT WITH OR WITHOUT PRESENCE OF ULCERS (sore throat, sores in mouth, or a toothache) UNUSUAL RASH, SWELLING OR PAIN  UNUSUAL VAGINAL DISCHARGE OR ITCHING   Items with * indicate a potential emergency and should be followed up as soon as possible or go to the Emergency Department if any problems should occur.  Please show the CHEMOTHERAPY ALERT CARD or IMMUNOTHERAPY ALERT CARD at check-in to  the Emergency Department and triage nurse.  Should you have questions after your visit or need to cancel or reschedule your appointment, please contact [facility]  [phone] and follow the prompts.  Office hours are 8:00 a.m. to 4:30 p.m. Monday - Friday. Please note that voicemails left after 4:00 p.m. may not be returned until the following business day.  We are closed weekends and major holidays. You have access to a nurse at all times for urgent questions. Please call the main number to the clinic [phone] and follow the prompts.  For any non-urgent questions, you may also contact your provider using MyChart. We now offer e-Visits for anyone 18 and older to request care online for non-urgent symptoms. For details visit [email or web address].   Also download the MyChart app! Go to the app store, search "MyChart", open the app, select [facility], and log in with your MyChart username and password.  Masks are optional in the cancer centers. If you would like for your care team to wear a mask while they are taking care of you, please let them know. For doctor visits, patients may have with them one support person who is at least 70 years old. At this time, visitors are not allowed in the infusion area.   

## 2022-01-28 NOTE — Progress Notes (Addendum)
Rx sent to correct pharmacy with updated [employer] auth and date obtained

## 2022-02-02 ENCOUNTER — Encounter: Payer: Self-pay | Admitting: Internal Medicine

## 2022-02-05 ENCOUNTER — Ambulatory Visit: Payer: Medicare Other | Admitting: Medical

## 2022-02-11 ENCOUNTER — Inpatient Hospital Stay (HOSPITAL_BASED_OUTPATIENT_CLINIC_OR_DEPARTMENT_OTHER): Payer: Medicare Other | Admitting: Internal Medicine

## 2022-02-11 ENCOUNTER — Encounter: Payer: Self-pay | Admitting: Internal Medicine

## 2022-02-11 ENCOUNTER — Inpatient Hospital Stay: Payer: Medicare Other

## 2022-02-11 DIAGNOSIS — Z95828 Presence of other vascular implants and grafts: Secondary | ICD-10-CM

## 2022-02-11 DIAGNOSIS — C9000 Multiple myeloma not having achieved remission: Secondary | ICD-10-CM

## 2022-02-11 DIAGNOSIS — Z5112 Encounter for antineoplastic immunotherapy: Secondary | ICD-10-CM | POA: Diagnosis not present

## 2022-02-11 LAB — CBC WITH DIFFERENTIAL/PLATELET
Abs Immature Granulocytes: 0.1 10*3/uL — ABNORMAL HIGH (ref 0.00–0.07)
Basophils Absolute: 0 10*3/uL (ref 0.0–0.1)
Basophils Relative: 0 %
Eosinophils Absolute: 0 10*3/uL (ref 0.0–0.5)
Eosinophils Relative: 0 %
HCT: 30.2 % — ABNORMAL LOW (ref 36.0–46.0)
Hemoglobin: 9.8 g/dL — ABNORMAL LOW (ref 12.0–15.0)
Immature Granulocytes: 2 %
Lymphocytes Relative: 29 %
Lymphs Abs: 1.8 10*3/uL (ref 0.7–4.0)
MCH: 30 pg (ref 26.0–34.0)
MCHC: 32.5 g/dL (ref 30.0–36.0)
MCV: 92.4 fL (ref 80.0–100.0)
Monocytes Absolute: 0.2 10*3/uL (ref 0.1–1.0)
Monocytes Relative: 4 %
Neutro Abs: 4.1 10*3/uL (ref 1.7–7.7)
Neutrophils Relative %: 65 %
Platelets: 275 10*3/uL (ref 150–400)
RBC: 3.27 MIL/uL — ABNORMAL LOW (ref 3.87–5.11)
RDW: 15.9 % — ABNORMAL HIGH (ref 11.5–15.5)
WBC: 6.2 10*3/uL (ref 4.0–10.5)
nRBC: 0 % (ref 0.0–0.2)

## 2022-02-11 LAB — COMPREHENSIVE METABOLIC PANEL
ALT: 12 U/L (ref 0–44)
AST: 21 U/L (ref 15–41)
Albumin: 3.9 g/dL (ref 3.5–5.0)
Alkaline Phosphatase: 66 U/L (ref 38–126)
Anion gap: 10 (ref 5–15)
BUN: 17 mg/dL (ref 8–23)
CO2: 20 mmol/L — ABNORMAL LOW (ref 22–32)
Calcium: 8.6 mg/dL — ABNORMAL LOW (ref 8.9–10.3)
Chloride: 108 mmol/L (ref 98–111)
Creatinine, Ser: 1.14 mg/dL — ABNORMAL HIGH (ref 0.44–1.00)
GFR, Estimated: 52 mL/min — ABNORMAL LOW (ref >60)
Glucose, Bld: 136 mg/dL — ABNORMAL HIGH (ref 70–99)
Potassium: 3.7 mmol/L (ref 3.5–5.1)
Sodium: 138 mmol/L (ref 135–145)
Total Bilirubin: 0.4 mg/dL (ref 0.3–1.2)
Total Protein: 8.2 g/dL — ABNORMAL HIGH (ref 6.5–8.1)

## 2022-02-11 MED ORDER — DEXAMETHASONE 4 MG PO TABS
20.0000 mg | ORAL_TABLET | Freq: Once | ORAL | Status: AC
  Administered 2022-02-11: 20 mg via ORAL
  Filled 2022-02-11: qty 5

## 2022-02-11 MED ORDER — DIPHENHYDRAMINE HCL 25 MG PO CAPS
50.0000 mg | ORAL_CAPSULE | Freq: Once | ORAL | Status: AC
  Administered 2022-02-11: 50 mg via ORAL
  Filled 2022-02-11: qty 2

## 2022-02-11 MED ORDER — HEPARIN SOD (PORK) LOCK FLUSH 100 UNIT/ML IV SOLN
500.0000 [IU] | Freq: Once | INTRAVENOUS | Status: DC
  Filled 2022-02-11: qty 5

## 2022-02-11 MED ORDER — DARATUMUMAB-HYALURONIDASE-FIHJ 1800-30000 MG-UT/15ML ~~LOC~~ SOLN
1800.0000 mg | Freq: Once | SUBCUTANEOUS | Status: AC
  Administered 2022-02-11: 1800 mg via SUBCUTANEOUS
  Filled 2022-02-11: qty 15

## 2022-02-11 MED ORDER — ACETAMINOPHEN 325 MG PO TABS
650.0000 mg | ORAL_TABLET | Freq: Once | ORAL | Status: AC
  Administered 2022-02-11: 650 mg via ORAL
  Filled 2022-02-11: qty 2

## 2022-02-11 NOTE — Addendum Note (Signed)
Addended by: Jonnie Finner D on: 02/11/2022 11:16 AM   Modules accepted: Orders

## 2022-02-11 NOTE — Progress Notes (Signed)
Has [facility] forms to be filled. This is for people who have a need for home and community services.  Pulse is low-47,  BP 141/91, she states this is normal for her. No dizziness or light-headedness.

## 2022-02-11 NOTE — Assessment & Plan Note (Addendum)
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  Currently recurrent-on salvage therapy. STABLE.   # Patient currently on salvage therapy with daratumumab  lenalidomide [15 mg 3 w; 1-w] dexamethasone.    # Continue cycle #5-day 1 Dara SQ weekly; continue REVLIMID 15 mg again. 3 weeks/1 week off; continue. Labs today reviewed;  acceptable for treatment today. JUNE 2023- 0.8 gm; lamda LC= 148- overall STABLE.   STABLE.   # Iatrogenic hypothyroidism [ Graves/goiter s/p total thyroidectomy;aug,2022 ]-  April 2023- TSH-LOW; decrease the dose of Synthroid to 100 mcg/day- MAY 2023- TSH- WNL.STABLE.  # Muscle cramps-?  Revlimid. improved  On tramadol q 12 hours prn-STABLE.   # Chronic diastolic CHF-clinicallySTABLE.   # Bone lesions /last zometa on 11/27/2017.  Holding Zometa secondary to poor dentition.  Awaiting dental evaluation in AUG, 2023; will need hematology clearance prior patient dental extraction.  # IV access: mediport   :MM panel; K/l light chain ratio q 4 W  # DISPOSITION:  #  Dara today #  Follow up in 2 weeks- NP ;port- labs-cbc/cmp;- Dara SQ. #  Follow up in 4 weeks- MD;Dara SQ; 'port- labs-cbc/cmp;- Thryoid profile MM panel; K/l light chain.  Dr.B

## 2022-02-11 NOTE — Patient Instructions (Signed)
[facility]  Discharge Instructions: Thank you for choosing [name] to provide your oncology and hematology care.  If you have a lab appointment with the [name], please go directly to the [name] and check in at the registration area.  Wear comfortable clothing and clothing appropriate for easy access to any Portacath or PICC line.   We strive to give you quality time with your provider. You may need to reschedule your appointment if you arrive late (15 or more minutes).  Arriving late affects you and other patients whose appointments are after yours.  Also, if you miss three or more appointments without notifying the office, you may be dismissed from the clinic at the provider's discretion.      For prescription refill requests, have your pharmacy contact our office and allow 72 hours for refills to be completed.    Today you received the following chemotherapy and/or immunotherapy agents: Darzalex Faspro      To help prevent nausea and vomiting after your treatment, we encourage you to take your nausea medication as directed.  BELOW ARE SYMPTOMS THAT SHOULD BE REPORTED IMMEDIATELY: *FEVER GREATER THAN 100.4 F (38 C) OR HIGHER *CHILLS OR SWEATING *NAUSEA AND VOMITING THAT IS NOT CONTROLLED WITH YOUR NAUSEA MEDICATION *UNUSUAL SHORTNESS OF BREATH *UNUSUAL BRUISING OR BLEEDING *URINARY PROBLEMS (pain or burning when urinating, or frequent urination) *BOWEL PROBLEMS (unusual diarrhea, constipation, pain near the anus) TENDERNESS IN MOUTH AND THROAT WITH OR WITHOUT PRESENCE OF ULCERS (sore throat, sores in mouth, or a toothache) UNUSUAL RASH, SWELLING OR PAIN  UNUSUAL VAGINAL DISCHARGE OR ITCHING   Items with * indicate a potential emergency and should be followed up as soon as possible or go to the Emergency Department if any problems should occur.  Please show the CHEMOTHERAPY ALERT CARD or IMMUNOTHERAPY ALERT CARD at  check-in to the Emergency Department and triage nurse.  Should you have questions after your visit or need to cancel or reschedule your appointment, please contact [facility]  [phone] and follow the prompts.  Office hours are 8:00 a.m. to 4:30 p.m. Monday - Friday. Please note that voicemails left after 4:00 p.m. may not be returned until the following business day.  We are closed weekends and major holidays. You have access to a nurse at all times for urgent questions. Please call the main number to the clinic [phone] and follow the prompts.  For any non-urgent questions, you may also contact your provider using MyChart. We now offer e-Visits for anyone 18 and older to request care online for non-urgent symptoms. For details visit [email or web address].   Also download the MyChart app! Go to the app store, search "MyChart", open the app, select [facility], and log in with your MyChart username and password.  Masks are optional in the cancer centers. If you would like for your care team to wear a mask while they are taking care of you, please let them know. For doctor visits, patients may have with them one support person who is at least 70 years old. At this time, visitors are not allowed in the infusion area.   

## 2022-02-11 NOTE — Progress Notes (Signed)
[place] OFFICE PROGRESS NOTE  Patient Care Team: Marguerita Merles, MD as PCP - General (Family Medicine) End, Harrell Gave, MD as PCP - Cardiology (Cardiology) [name], [name] [name], MD as Consulting Physician (Hematology and Oncology) Ottie Glazier, MD as Consulting Physician (Pulmonary Disease) Gabriel Carina Betsey Holiday, MD as Consulting Physician (Endocrinology)   Cancer Staging  No matching staging information was found for the patient.   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 No morphologic support for residual myeloma disease. Negative for minimal residual disease by MM-MRD flow cytometry; HOLD REVLIMID [leg swelling]; AUG 2021-PET scan negative for myeloma; continue to hold Revlimid  # MARCH 2021-diastolic congestive heart failure [Dr.End]  # BMBx- OCT 2021- BMBx- 5% plasmacytosis-however this appears to be more polyclonal rather than monoclonal-not explain patient worsening anemia.   # OCT 2022- 18th- Dara- Rev-Dex  #November 2021-hyperthyroidism/goiter-  [D.Bennett/Dr.Solum]-methimazole. S/p Thyroidectomy [Dr.Kim; [facility] 29794] --------------------------  # 12/12- RIGHT JUGULAR DVT-x 319mn xarelto; finished April 2020; September 2020-EGD/dysphagia; Dr. AnVicente Males# Acute renal failure [Dr.Singh; Proteinuria 1.5gm/day ]; acyclovir/Asprin ------------------------------------------------------------------------------------------------------------   DIAGNOSIS: '[ ]'  MULTIPLE MYELOMA  STAGE: III/HIGH RISK ;GOALS: CONTROL  CURRENT/MOST RECENT THERAPY-maintenance Revlimid- on HOLD    Multiple myeloma not having achieved remission ([facility] Portal 11/21/2017 - 04/28/2018 Chemotherapy   Patient is on Treatment Plan : MYELOMA SALVAGE Cyclophosphamide / Carfilzomib / Dexamethasone (CCd) q28d     05/12/2021 -  Chemotherapy   Patient is on Treatment Plan : MYELOMA RELAPSED REFRACTORY Daratumumab SQ + Lenalidomide + Dexamethasone (DaraRd) q28d      INTERVAL HISTORY: Ambulating independently.  Alone.  [name]38.o.  female pleasant patient above history of relapsed multiple myeloma--currently on daratumumab-Revlimid-dexamethasone  is here for follow-up.  Patient has not had any hospitalizations.  Appetite is good but no weight loss.  No nausea no vomiting.  Chronic mild muscle spasms needing tramadol every 12 hours also.   Ongoing cough not any worse.  Continues to be needing guaifenesin.  Review of Systems  Constitutional:  Positive for malaise/fatigue. Negative for chills, diaphoresis and fever.  HENT:  Negative for nosebleeds and sore throat.   Eyes:  Negative for double vision.  Respiratory:  Negative for hemoptysis.   Cardiovascular:  Negative for chest pain, palpitations and orthopnea.  Gastrointestinal:  Negative for abdominal pain, blood in stool, constipation, heartburn and melena.  Genitourinary:  Negative for dysuria, frequency and urgency.  Musculoskeletal:  Positive for back pain.  Skin: Negative.  Negative for itching and rash.   Neurological:  Negative for dizziness, tingling, focal weakness, weakness and headaches.  Endo/Heme/Allergies:  Does not bruise/bleed easily.  Psychiatric/Behavioral:  Negative for depression. The patient is not nervous/anxious.     PAST MEDICAL  HISTORY :  Past Medical History:  Diagnosis Date   (HFpEF) heart failure with preserved ejection fraction ([place])    a. 06/2020 Echo: EF 60-65%, no rwma, nl RV size/fxn. Triv MR. Triv TR/AI. Mod elev PASP.   Anxiety    COPD (chronic obstructive pulmonary disease) (HCC)    GERD (gastroesophageal reflux disease)    Hypertension    Hyperthyroidism    Hypokalemia    IgA myeloma (HCC)    Multiple myeloma (HCC)    Pneumonia    Red blood cell antibody positive, compatible PRBC difficult to obtain    S/P autologous bone marrow transplantation ([place])     PAST SURGICAL HISTORY :   Past Surgical History:  Procedure Laterality Date   ANTERIOR VITRECTOMY Left 10/07/2021   Procedure: ANTERIOR VITRECTOMY;  Surgeon: Leandrew Koyanagi, MD;  Location: [facility];  Service: Ophthalmology;  Laterality: Left;   CATARACT EXTRACTION W/PHACO Left 10/07/2021   Procedure: CATARACT EXTRACTION PHACO AND INTRAOCULAR LENS PLACEMENT ([place]) LEFT VISION BLUE;  Surgeon: Leandrew Koyanagi, MD;  Location: [facility];  Service: Ophthalmology;  Laterality: Left;  kit for manual incision 14.56 01:21.4   CATARACT EXTRACTION W/PHACO Right 10/21/2021   Procedure: CATARACT EXTRACTION PHACO AND INTRAOCULAR LENS PLACEMENT ([facility]) RIGHT 11.12 01:48.1;  Surgeon: Leandrew Koyanagi, MD;  Location: [facility];  Service: Ophthalmology;  Laterality: Right;   ESOPHAGOGASTRODUODENOSCOPY (EGD) WITH PROPOFOL N/A 03/30/2019   Procedure: ESOPHAGOGASTRODUODENOSCOPY (EGD) WITH PROPOFOL;  Surgeon: Jonathon Bellows, MD;  Location[facility] END[facility];  Service: Gastroenterology;  Laterality: N/A;   IR FLUORO GUIDE PORT INSERTION RIGHT  12/16/2017   IR IMAGING GUIDED PORT INSERTION   06/15/2021   THYROIDECTOMY N/A     FAMILY HISTORY :   Family History  Problem Relation Age of Onset   Pneumonia Mother    Seizures Father     SOCIAL HISTORY:   Social History   Tobacco Use   Smoking status: Former    Packs/day: 0.25    Types: Cigarettes    Quit date: 03/18/2021    Years since quitting: 0.9   Smokeless tobacco: Never   Tobacco comments:    2 cigarettes QOD  Vaping Use   Vaping Use: Never used  Substance Use Topics   Alcohol use: No   Drug use: No    ALLERGIES:  has No Known Allergies.  MEDICATIONS:  Current Outpatient Medications  Medication Sig Dispense Refill   acyclovir (ZOVIRAX) 400 MG tablet Take 1 tablet (400 mg total) by mouth 2 (two) times daily. 60 tablet 3   albuterol (VENTOLIN HFA) 108 (90 Base) MCG/ACT inhaler Inhale 2 puffs into the lungs every 6 (six) hours as needed for wheezing or shortness of breath. 8 g 3   aspirin EC 81 MG EC tablet Take 1 tablet (81 mg total) by mouth daily. 90 tablet 3   buPROPion (WELLBUTRIN) 75 MG tablet Take 75 mg by mouth daily.     calcium-vitamin D (OSCAL WITH D) 500MG-200UNIT (5MCG) tablet Take 1 tablet by mouth 2 (two) times daily. 60 tablet 0   carvedilol (COREG) 3.125 MG tablet Take 1 tablet (3.125 mg total) by mouth 2 (two) times daily with a meal. 60 tablet 5   clotrimazole-betamethasone (LOTRISONE) cream Apply topically 2 (two) times daily. 30 g 0   dexamethasone (DECADRON) 4 MG tablet Start 2 days prior to infusion; Take for 2 days. Do not take on the day of infusion. 60 tablet 3   Ensure Plus (ENSURE PLUS) LIQD Take 237 mLs  by mouth 3 (three) times a week.     furosemide (LASIX) 40 MG tablet Take 1 tablet (40 mg total) by mouth daily. 90 tablet 3   guaiFENesin-codeine 100-10 MG/5ML syrup Take 5 mLs by mouth every 12 (twelve) hours as needed for cough. May cause drowsiness. Do not drive with this medication. 120 mL 0   lenalidomide (REVLIMID) 15 MG capsule Take 1 capsule (15 mg total) by mouth daily.  Take for 21 days, then hold for 7 days. Repeat every 28 days. 21 capsule 0   levothyroxine (SYNTHROID) 100 MCG tablet Take 1 tablet (100 mcg total) by mouth daily before breakfast. 30 tablet 3   lidocaine-prilocaine (EMLA) cream Apply 1 application topically as needed. Apply to port and cover with saran wrap 1-2 hours prior to port access 30 g 1   magnesium oxide (MAG-OX) 400 (240 Mg) MG tablet Take 1 tablet (400 mg total) by mouth 2 (two) times daily. 60 tablet 3   montelukast (SINGULAIR) 10 MG tablet Take 1 tablet (10 mg total) by mouth at bedtime. 30 tablet 3   ondansetron (ZOFRAN) 8 MG tablet One pill every 8 hours as needed for nausea/vomitting. 40 tablet 1   pantoprazole (PROTONIX) 40 MG tablet Take 1 tablet by mouth once daily 90 tablet 0   polyethylene glycol (MIRALAX / GLYCOLAX) packet Take 17 g by mouth daily as needed for mild constipation. 14 each 0   potassium chloride SA (KLOR-CON) 20 MEQ tablet Take 1 tablet (20 mEq total) by mouth 2 (two) times daily. 60 tablet 6   spironolactone (ALDACTONE) 25 MG tablet Take 1 tablet by mouth once daily 90 tablet 0   traMADol (ULTRAM) 50 MG tablet TAKE 1 TABLET BY MOUTH EVERY 12 HOURS AS NEEDED 60 tablet 0   Vitamin D, Ergocalciferol, (DRISDOL) 1.25 MG (50000 UNIT) CAPS capsule Take 1 capsule by mouth once a week 12 capsule 0   No current facility-administered medications for this visit.   Facility-Administered Medications Ordered in Other Visits  Medication Dose Route Frequency Provider Last Rate Last Admin   0.9 %  sodium chloride infusion   Intravenous Continuous Monia Sabal, PA-C        PHYSICAL EXAMINATION: ECOG PERFORMANCE STATUS: 1 - Symptomatic but completely ambulatory  BP (!) 141/91 (BP Location: Left Arm, Patient Position: Sitting, Cuff Size: Normal)   Pulse (!) 47   Temp 98.2 F (36.8 C) (Tympanic)   Ht '5\' 2"'  (1.575 m)   Wt 176 lb 6.4 oz (80 kg)   SpO2 99%   BMI 32.26 kg/m   Filed Weights   02/11/22 0921  Weight: 176  lb 6.4 oz (80 kg)     Physical Exam HENT:     Head: Normocephalic and atraumatic.  Eyes:     Pupils: Pupils are equal, round, and reactive to light.  Cardiovascular:     Rate and Rhythm: Normal rate and regular rhythm.  Pulmonary:     Effort: Pulmonary effort is normal. No respiratory distress.     Breath sounds: Normal breath sounds. No wheezing.  Abdominal:     General: Bowel sounds are normal. There is no distension.     Palpations: Abdomen is soft. There is no mass.     Tenderness: There is no abdominal tenderness. There is no guarding or rebound.  Musculoskeletal:        General: No tenderness. Normal range of motion.     Cervical back: Normal range of motion and neck supple.  Skin:  General: Skin is warm.  Neurological:     Mental Status: She is alert and oriented to person, place, and time.  Psychiatric:        Mood and Affect: Affect normal.    LABORATORY DATA:  I have reviewed the data as listed    Component Value Date/Time   NA 138 02/11/2022 0910   NA 141 01/25/2020 1530   K 3.7 02/11/2022 0910   CL 108 02/11/2022 0910   CO2 20 (L) 02/11/2022 0910   GLUCOSE 136 (H) 02/11/2022 0910   BUN 17 02/11/2022 0910   BUN 19 01/25/2020 1530   CREATININE 1.14 (H) 02/11/2022 0910   CALCIUM 8.6 (L) 02/11/2022 0910   PROT 8.2 (H) 02/11/2022 0910   ALBUMIN 3.9 02/11/2022 0910   AST 21 02/11/2022 0910   ALT 12 02/11/2022 0910   ALKPHOS 66 02/11/2022 0910   BILITOT 0.4 02/11/2022 0910   GFRNONAA 52 (L) 02/11/2022 0910   GFRAA NOT CALCULATED 04/17/2020 1352    No results found for: "SPEP", "UPEP"  Lab Results  Component Value Date   WBC 6.2 02/11/2022   NEUTROABS 4.1 02/11/2022   HGB 9.8 (L) 02/11/2022   HCT 30.2 (L) 02/11/2022   MCV 92.4 02/11/2022   PLT 275 02/11/2022      Chemistry      Component Value Date/Time   NA 138 02/11/2022 0910   NA 141 01/25/2020 1530   K 3.7 02/11/2022 0910   CL 108 02/11/2022 0910   CO2 20 (L) 02/11/2022 0910   BUN 17  02/11/2022 0910   BUN 19 01/25/2020 1530   CREATININE 1.14 (H) 02/11/2022 0910      Component Value Date/Time   CALCIUM 8.6 (L) 02/11/2022 0910   ALKPHOS 66 02/11/2022 0910   AST 21 02/11/2022 0910   ALT 12 02/11/2022 0910   BILITOT 0.4 02/11/2022 0910      (H): Data is abnormally high   Latest Reference Range & Units Most Recent 12/24/20 14:01 04/01/21 14:34 04/28/21 09:17 06/11/21 09:42 07/03/21 10:32 08/20/21 10:06  M Protein SerPl Elph-Mcnc Not Observed g/dL 1.1 (H) (C) 08/20/21 10:06 Not Observed (C) 0.6 (H) (C) 0.7 (H) (C) 0.5 (H) (C) 0.7 (H) (C) 1.1 (H) (C)    Latest Reference Range & Units Most Recent 09/17/21 08:47 10/15/21 08:25 11/12/21 08:55  Kappa free light chain 3.3 - 19.4 mg/L 5.0 11/12/21 08:55 7.2 7.2 5.0  Lambda free light chains 5.7 - 26.3 mg/L 223.6 (H) 11/12/21 08:55 215.3 (H) 340.2 (H) 223.6 (H)  Kappa, lambda light chain ratio 0.26 - 1.65  0.02 (L) 11/12/21 08:55 0.03 (L) 0.02 (L) 0.02 (L)  (H): Data is abnormally high (L): Data is abnormally low  RADIOGRAPHIC STUDIES: I have personally reviewed the radiological images as listed and agreed with the findings in the report. No results found.   ASSESSMENT & PLAN:  Multiple myeloma not having achieved remission ([place]) # RECURRENT Multiple myeloma stage III [high-risk cytogenetics-status post KRD-VGPR ; status post autologous stem cell transplant on 06/15/18.  Currently recurrent-on salvage therapy. STABLE.   # Patient currently on salvage therapy with daratumumab  lenalidomide [15 mg 3 w; 1-w] dexamethasone.    # Continue cycle #5-day 1 Dara SQ weekly; continue REVLIMID 15 mg again. 3 weeks/1 week off; continue. Labs today reviewed;  acceptable for treatment today. JUNE 2023- 0.8 gm; lamda LC= 148- overall STABLE.   STABLE.   # Iatrogenic hypothyroidism [ Graves/goiter s/p total thyroidectomy;aug,2022 ]-  April 2023- TSH-LOW;  decrease the dose of Synthroid to 100 mcg/day- MAY 2023- TSH- WNL.STABLE.  # Muscle  cramps-?  Revlimid. improved  On tramadol q 12 hours prn-STABLE.   # Chronic diastolic CHF-clinicallySTABLE.   # Bone lesions /last zometa on 11/27/2017.  Holding Zometa secondary to poor dentition.  Awaiting dental evaluation in AUG, 2023; will need hematology clearance prior patient dental extraction.  # IV access: mediport   :MM panel; K/l light chain ratio q 4 W  # DISPOSITION:  #  Dara today #  Follow up in 2 weeks- NP ;port- labs-cbc/cmp;- Dara SQ. #  Follow up in 4 weeks- MD;Dara SQ; 'port- labs-cbc/cmp;- Thryoid profile MM panel; K/l light chain.  Dr.B     Orders Placed This Encounter  Procedures   CBC with Differential/Platelet    Standing Status:   Standing    Number of Occurrences:   2    Standing Expiration Date:   02/12/2023   Comprehensive metabolic panel    Standing Status:   Standing    Number of Occurrences:   2    Standing Expiration Date:   02/12/2023   Thyroid Panel With TSH    Standing Status:   Future    Standing Expiration Date:   02/12/2023   Multiple Myeloma Panel (SPEP&IFE w/QIG)    Standing Status:   Future    Standing Expiration Date:   02/12/2023   Kappa/lambda light chains    Standing Status:   Future    Standing Expiration Date:   02/12/2023     All questions were answered. The patient knows to call the clinic with any problems, questions or concerns.      [name], MD 02/11/2022 9:46 AM

## 2022-02-12 LAB — KAPPA/LAMBDA LIGHT CHAINS
Kappa free light chain: 4.1 mg/L (ref 3.3–19.4)
Kappa, lambda light chain ratio: 0.03 — ABNORMAL LOW (ref 0.26–1.65)
Lambda free light chains: 142.1 mg/L — ABNORMAL HIGH (ref 5.7–26.3)

## 2022-02-12 LAB — VITAMIN D 25 HYDROXY (VIT D DEFICIENCY, FRACTURES): Vit D, 25-Hydroxy: 44.02 ng/mL (ref 30–100)

## 2022-02-15 ENCOUNTER — Other Ambulatory Visit: Payer: Self-pay

## 2022-02-16 LAB — MULTIPLE MYELOMA PANEL, SERUM
Albumin SerPl Elph-Mcnc: 3.4 g/dL (ref 2.9–4.4)
Albumin/Glob SerPl: 0.9 (ref 0.7–1.7)
Alpha 1: 0.2 g/dL (ref 0.0–0.4)
Alpha2 Glob SerPl Elph-Mcnc: 1 g/dL (ref 0.4–1.0)
B-Globulin SerPl Elph-Mcnc: 2.5 g/dL — ABNORMAL HIGH (ref 0.7–1.3)
Gamma Glob SerPl Elph-Mcnc: 0.4 g/dL (ref 0.4–1.8)
Globulin, Total: 4.1 g/dL — ABNORMAL HIGH (ref 2.2–3.9)
IgA: 746 mg/dL — ABNORMAL HIGH (ref 87–352)
IgG (Immunoglobin G), Serum: 481 mg/dL — ABNORMAL LOW (ref 586–1602)
IgM (Immunoglobulin M), Srm: 9 mg/dL — ABNORMAL LOW (ref 26–217)
M Protein SerPl Elph-Mcnc: 0.6 g/dL — ABNORMAL HIGH
Total Protein ELP: 7.5 g/dL (ref 6.0–8.5)

## 2022-02-23 ENCOUNTER — Other Ambulatory Visit: Payer: Self-pay

## 2022-02-25 ENCOUNTER — Ambulatory Visit: Payer: Medicare Other

## 2022-02-25 ENCOUNTER — Encounter: Payer: Self-pay | Admitting: Medical Oncology

## 2022-02-25 ENCOUNTER — Inpatient Hospital Stay (HOSPITAL_BASED_OUTPATIENT_CLINIC_OR_DEPARTMENT_OTHER): Payer: Medicare Other | Admitting: Medical Oncology

## 2022-02-25 ENCOUNTER — Other Ambulatory Visit: Payer: Self-pay

## 2022-02-25 ENCOUNTER — Inpatient Hospital Stay: Payer: Medicare Other

## 2022-02-25 ENCOUNTER — Other Ambulatory Visit: Payer: Medicare Other

## 2022-02-25 ENCOUNTER — Inpatient Hospital Stay: Payer: Medicare Other | Attending: Nurse Practitioner

## 2022-02-25 ENCOUNTER — Ambulatory Visit: Payer: Medicare Other | Admitting: Internal Medicine

## 2022-02-25 VITALS — BP 96/83 | HR 51 | Temp 98.3°F | Resp 18 | Ht 62.0 in | Wt 179.0 lb

## 2022-02-25 DIAGNOSIS — Z95828 Presence of other vascular implants and grafts: Secondary | ICD-10-CM | POA: Diagnosis not present

## 2022-02-25 DIAGNOSIS — Z79899 Other long term (current) drug therapy: Secondary | ICD-10-CM | POA: Diagnosis not present

## 2022-02-25 DIAGNOSIS — Z5112 Encounter for antineoplastic immunotherapy: Secondary | ICD-10-CM | POA: Insufficient documentation

## 2022-02-25 DIAGNOSIS — C9000 Multiple myeloma not having achieved remission: Secondary | ICD-10-CM

## 2022-02-25 DIAGNOSIS — C9002 Multiple myeloma in relapse: Secondary | ICD-10-CM | POA: Diagnosis not present

## 2022-02-25 DIAGNOSIS — E876 Hypokalemia: Secondary | ICD-10-CM | POA: Diagnosis not present

## 2022-02-25 LAB — COMPREHENSIVE METABOLIC PANEL
ALT: 12 U/L (ref 0–44)
AST: 25 U/L (ref 15–41)
Albumin: 3.8 g/dL (ref 3.5–5.0)
Alkaline Phosphatase: 58 U/L (ref 38–126)
Anion gap: 12 (ref 5–15)
BUN: 17 mg/dL (ref 8–23)
CO2: 21 mmol/L — ABNORMAL LOW (ref 22–32)
Calcium: 8.8 mg/dL — ABNORMAL LOW (ref 8.9–10.3)
Chloride: 105 mmol/L (ref 98–111)
Creatinine, Ser: 0.99 mg/dL (ref 0.44–1.00)
GFR, Estimated: >60 mL/min (ref >60)
Glucose, Bld: 141 mg/dL — ABNORMAL HIGH (ref 70–99)
Potassium: 3.5 mmol/L (ref 3.5–5.1)
Sodium: 138 mmol/L (ref 135–145)
Total Bilirubin: 0.6 mg/dL (ref 0.3–1.2)
Total Protein: 7.7 g/dL (ref 6.5–8.1)

## 2022-02-25 LAB — CBC WITH DIFFERENTIAL/PLATELET
Abs Immature Granulocytes: 0.06 10*3/uL (ref 0.00–0.07)
Basophils Absolute: 0 10*3/uL (ref 0.0–0.1)
Basophils Relative: 0 %
Eosinophils Absolute: 0 10*3/uL (ref 0.0–0.5)
Eosinophils Relative: 0 %
HCT: 30 % — ABNORMAL LOW (ref 36.0–46.0)
Hemoglobin: 9.5 g/dL — ABNORMAL LOW (ref 12.0–15.0)
Immature Granulocytes: 1 %
Lymphocytes Relative: 18 %
Lymphs Abs: 1.6 10*3/uL (ref 0.7–4.0)
MCH: 29.8 pg (ref 26.0–34.0)
MCHC: 31.7 g/dL (ref 30.0–36.0)
MCV: 94 fL (ref 80.0–100.0)
Monocytes Absolute: 0.2 10*3/uL (ref 0.1–1.0)
Monocytes Relative: 2 %
Neutro Abs: 6.8 10*3/uL (ref 1.7–7.7)
Neutrophils Relative %: 79 %
Platelets: 241 10*3/uL (ref 150–400)
RBC: 3.19 MIL/uL — ABNORMAL LOW (ref 3.87–5.11)
RDW: 16.4 % — ABNORMAL HIGH (ref 11.5–15.5)
WBC: 8.6 10*3/uL (ref 4.0–10.5)
nRBC: 0 % (ref 0.0–0.2)

## 2022-02-25 MED ORDER — ACETAMINOPHEN 325 MG PO TABS
650.0000 mg | ORAL_TABLET | Freq: Once | ORAL | Status: AC
  Administered 2022-02-25: 650 mg via ORAL
  Filled 2022-02-25: qty 2

## 2022-02-25 MED ORDER — POTASSIUM CHLORIDE CRYS ER 20 MEQ PO TBCR: 20.0000 meq | EXTENDED_RELEASE_TABLET | Freq: Two times a day (BID) | ORAL | 6 refills | Status: DC

## 2022-02-25 MED ORDER — DARATUMUMAB-HYALURONIDASE-FIHJ 1800-30000 MG-UT/15ML ~~LOC~~ SOLN
1800.0000 mg | Freq: Once | SUBCUTANEOUS | Status: AC
  Administered 2022-02-25: 1800 mg via SUBCUTANEOUS
  Filled 2022-02-25: qty 15

## 2022-02-25 MED ORDER — PANTOPRAZOLE SODIUM 40 MG PO TBEC: 40.0000 mg | DELAYED_RELEASE_TABLET | Freq: Every day | ORAL | 0 refills | Status: DC

## 2022-02-25 MED ORDER — ALBUTEROL SULFATE HFA 108 (90 BASE) MCG/ACT IN AERS: 2.0000 | INHALATION_SPRAY | Freq: Four times a day (QID) | RESPIRATORY_TRACT | 3 refills | Status: DC | PRN

## 2022-02-25 MED ORDER — DEXAMETHASONE 4 MG PO TABS
20.0000 mg | ORAL_TABLET | Freq: Once | ORAL | Status: AC
  Administered 2022-02-25: 20 mg via ORAL
  Filled 2022-02-25: qty 5

## 2022-02-25 MED ORDER — DIPHENHYDRAMINE HCL 25 MG PO CAPS
50.0000 mg | ORAL_CAPSULE | Freq: Once | ORAL | Status: AC
  Administered 2022-02-25: 50 mg via ORAL
  Filled 2022-02-25: qty 2

## 2022-02-25 MED ORDER — SODIUM CHLORIDE 0.9% FLUSH
10.0000 mL | Freq: Once | INTRAVENOUS | Status: AC
  Administered 2022-02-25: 10 mL via INTRAVENOUS
  Filled 2022-02-25: qty 10

## 2022-02-25 MED ORDER — HEPARIN SOD (PORK) LOCK FLUSH 100 UNIT/ML IV SOLN
500.0000 [IU] | Freq: Once | INTRAVENOUS | Status: AC
  Administered 2022-02-25: 500 [IU] via INTRAVENOUS
  Filled 2022-02-25: qty 5

## 2022-02-25 NOTE — Patient Instructions (Signed)
[facility] AT [place]  Discharge Instructions: Thank you for choosing [facility] to provide your oncology and hematology care.  If you have a lab appointment with the [facility], please go directly to the [facility] and check in at the registration area.  Wear comfortable clothing and clothing appropriate for easy access to any Portacath or PICC line.   We strive to give you quality time with your provider. You may need to reschedule your appointment if you arrive late (15 or more minutes).  Arriving late affects you and other patients whose appointments are after yours.  Also, if you miss three or more appointments without notifying the office, you may be dismissed from the clinic at the provider's discretion.      For prescription refill requests, have your pharmacy contact our office and allow 72 hours for refills to be completed.    Today you received the following chemotherapy and/or immunotherapy agents DARAZALEX      To help prevent nausea and vomiting after your treatment, we encourage you to take your nausea medication as directed.  BELOW ARE SYMPTOMS THAT SHOULD BE REPORTED IMMEDIATELY: *FEVER GREATER THAN 100.4 F (38 C) OR HIGHER *CHILLS OR SWEATING *NAUSEA AND VOMITING THAT IS NOT CONTROLLED WITH YOUR NAUSEA MEDICATION *UNUSUAL SHORTNESS OF BREATH *UNUSUAL BRUISING OR BLEEDING *URINARY PROBLEMS (pain or burning when urinating, or frequent urination) *BOWEL PROBLEMS (unusual diarrhea, constipation, pain near the anus) TENDERNESS IN MOUTH AND THROAT WITH OR WITHOUT PRESENCE OF ULCERS (sore throat, sores in mouth, or a toothache) UNUSUAL RASH, SWELLING OR PAIN  UNUSUAL VAGINAL DISCHARGE OR ITCHING   Items with * indicate a potential emergency and should be followed up as soon as possible or go to the Emergency Department if any problems should occur.  Please show the CHEMOTHERAPY ALERT CARD or IMMUNOTHERAPY ALERT CARD at check-in to  the Emergency Department and triage nurse.  Should you have questions after your visit or need to cancel or reschedule your appointment, please contact [facility] CANCER [place] AT [place]  [phone] and follow the prompts.  Office hours are 8:00 a.m. to 4:30 p.m. Monday - Friday. Please note that voicemails left after 4:00 p.m. may not be returned until the following business day.  We are closed weekends and major holidays. You have access to a nurse at all times for urgent questions. Please call the main number to the clinic [phone] and follow the prompts.  For any non-urgent questions, you may also contact your provider using MyChart. We now offer e-Visits for anyone 6 and older to request care online for non-urgent symptoms. For details visit [email or web address].   Also download the MyChart app! Go to the app store, search "MyChart", open the app, select [facility], and log in with your MyChart username and password.  Masks are optional in the cancer centers. If you would like for your care team to wear a mask while they are taking care of you, please let them know. For doctor visits, patients may have with them one support person who is at least 70 years old. At this time, visitors are not allowed in the infusion area.   Daratumumab; Hyaluronidase Injection What is this medication? DARATUMUMAB; HYALURONIDASE (dar a toom ue mab; hye al ur ON i dase) treats multiple myeloma, a type of bone marrow cancer. Daratumumab works by blocking a protein that causes cancer cells to grow and multiply. This helps to slow or stop the spread of cancer cells. Hyaluronidase  by increasing the absorption of other medications in the body to help them work better. This medication may also be used treat amyloidosis, a condition that causes the buildup of a protein (amyloid) in your body. It works by reducing the buildup of this protein, which decreases symptoms. It is a combination medication  that contains a monoclonal antibody. This medicine may be used for other purposes; ask your health care provider or pharmacist if you have questions. COMMON BRAND NAME(S): DARZALEX FASPRO What should I tell my care team before I take this medication? They need to know if you have any of these conditions: Heart disease Infection, such as chickenpox, cold sores, herpes, hepatitis B Lung or breathing disease An unusual or allergic reaction to daratumumab, hyaluronidase, other medications, foods, dyes, or preservatives Pregnant or trying to get pregnant Breast-feeding How should I use this medication? This medication is injected under the skin. It is given by your care team in a hospital or clinic setting. Talk to your care team about the use of this medication in children. Special care may be needed. Overdosage: If you think you have taken too much of this medicine contact a poison control center or emergency room at once. NOTE: This medicine is only for you. Do not share this medicine with others. What if I miss a dose? Keep appointments for follow-up doses. It is important not to miss your dose. Call your care team if you are unable to keep an appointment. What may interact with this medication? Interactions have not been studied. This list may not describe all possible interactions. Give your health care provider a list of all the medicines, herbs, non-prescription drugs, or dietary supplements you use. Also tell them if you smoke, drink alcohol, or use illegal drugs. Some items may interact with your medicine. What should I watch for while using this medication? Your condition will be monitored carefully while you are receiving this medication. This medication can cause serious allergic reactions. To reduce your risk, your care team may give you other medication to take before receiving this one. Be sure to follow the directions from your care team. This medication can affect the results of  blood tests to match your blood type. These changes can last for up to 6 months after the final dose. Your care team will do blood tests to match your blood type before you start treatment. Tell all of your care team that you are being treated with this medication before receiving a blood transfusion. This medication can affect the results of some tests used to determine treatment response; extra tests may be needed to evaluate response. Talk to your care team if you wish to become pregnant or think you are pregnant. This medication can cause serious birth defects if taken during pregnancy and for 3 months after the last dose. A reliable form of contraception is recommended while taking this medication and for 3 months after the last dose. Talk to your care team about effective forms of contraception. Do not breast-feed while taking this medication. What side effects may I notice from receiving this medication? Side effects that you should report to your care team as soon as possible: Allergic reactions--skin rash, itching, hives, swelling of the face, lips, tongue, or throat Heart rhythm changes--fast or irregular heartbeat, dizziness, feeling faint or lightheaded, chest pain, trouble breathing Infection--fever, chills, cough, sore throat, wounds that don't heal, pain or trouble when passing urine, general feeling of discomfort or being unwell Infusion reactions--chest pain, shortness   of breath or trouble breathing, feeling faint or lightheaded Sudden eye pain or change in vision such as blurry vision, seeing halos around lights, vision loss Unusual bruising or bleeding Side effects that usually do not require medical attention (report to your care team if they continue or are bothersome): Constipation Diarrhea Fatigue Nausea Pain, tingling, or numbness in the hands or feet Swelling of the ankles, hands, or feet This list may not describe all possible side effects. Call your doctor for medical  advice about side effects. You may report side effects to FDA at 1-800-FDA-1088. Where should I keep my medication? This medication is given in a hospital or clinic. It will not be stored at home. NOTE: This sheet is a summary. It may not cover all possible information. If you have questions about this medicine, talk to your doctor, pharmacist, or health care provider.  2023 Elsevier/Gold Standard (2021-11-04 00:00:00)   

## 2022-02-25 NOTE — Progress Notes (Signed)
[facility] OFFICE PROGRESS NOTE  Patient Care Team: Marguerita Merles, MD as PCP - General (Family Medicine) End, Harrell Gave, MD as PCP - Cardiology (Cardiology) [name], [name] [name], MD as Consulting Physician (Hematology and Oncology) Ottie Glazier, MD as Consulting Physician (Pulmonary Disease) Gabriel Carina Betsey Holiday, MD as Consulting Physician (Endocrinology)   Cancer Staging  No matching staging information was found for the patient.   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 No morphologic support for residual myeloma disease. Negative for minimal residual disease by MM-MRD flow cytometry; HOLD REVLIMID [leg swelling]; AUG 2021-PET scan negative for myeloma; continue to hold Revlimid  # MARCH 2021-diastolic congestive heart failure [Dr.End]  # BMBx- OCT 2021- BMBx- 5% plasmacytosis-however this appears to be more polyclonal rather than monoclonal-not explain patient worsening anemia.   # OCT 2022- 18th- Dara- Rev-Dex  #November 2021-hyperthyroidism/goiter-  [D.Bennett/Dr.Solum]-methimazole. S/p Thyroidectomy [Dr.Kim; [facility]] --------------------------  # 12/12- RIGHT JUGULAR DVT-x 377mn xarelto; finished April 2020; September 2020-EGD/dysphagia; Dr. AnVicente Males# Acute renal failure [Dr.Singh; Proteinuria 1.5gm/day ]; acyclovir/Asprin ------------------------------------------------------------------------------------------------------------   DIAGNOSIS: _0  MULTIPLE MYELOMA  STAGE: III/HIGH RISK ;GOALS: CONTROL  CURRENT/MOST RECENT THERAPY-maintenance Revlimid- on HOLD    Multiple myeloma not having achieved remission ([facility] 11/21/2017 - 04/28/2018 Chemotherapy   Patient is on Treatment Plan : MYELOMA SALVAGE Cyclophosphamide / Carfilzomib / Dexamethasone (CCd) q28d     05/12/2021 -  Chemotherapy   Patient is on Treatment Plan : MYELOMA RELAPSED REFRACTORY Daratumumab SQ + Lenalidomide + Dexamethasone (DaraRd) q28d      INTERVAL HISTORY: Ambulating independently.  Alone.  [name] pleasant patient above history of relapsed multiple myeloma--currently on daratumumab-Revlimid-dexamethasone  is here for follow-up.  She reports that she is doing well. She sees her 5 21ear old great grandson daily which helps keep her active. She has noticed that she does get fatigued but this is not new. She reports tolerating her treatment well. No unintentional weight loss, night sweats, fevers, new pains.   Review of Systems  Constitutional:  Positive for malaise/fatigue. Negative for chills, diaphoresis and fever.  HENT:  Negative for nosebleeds and sore throat.   Eyes:  Negative for double vision.  Respiratory:  Negative for hemoptysis.   Cardiovascular:  Negative for chest pain, palpitations and orthopnea.  Gastrointestinal:  Negative for abdominal pain, blood in stool, constipation, heartburn and melena.  Genitourinary:  Negative for dysuria, frequency and urgency.  Musculoskeletal:  Positive for back pain.  Skin:  Negative.  Negative for itching and rash.  Neurological:  Negative for dizziness, tingling, focal weakness, weakness and headaches.  Endo/Heme/Allergies:  Does not bruise/bleed easily.  Psychiatric/Behavioral:  Negative for depression. The patient is not  nervous/anxious.     PAST MEDICAL HISTORY :  Past Medical History:  Diagnosis Date   (HFpEF) heart failure with preserved ejection fraction ([place])    a. 06/2020 Echo: EF 60-65%, no rwma, nl RV size/fxn. Triv MR. Triv TR/AI. Mod elev PASP.   Anxiety    COPD (chronic obstructive pulmonary disease) (HCC)    GERD (gastroesophageal reflux disease)    Hypertension    Hyperthyroidism    Hypokalemia    IgA myeloma (HCC)    Multiple myeloma (HCC)    Pneumonia    Red blood cell antibody positive, compatible PRBC difficult to obtain    S/P autologous bone marrow transplantation ([place])     PAST SURGICAL HISTORY :   Past Surgical History:  Procedure Laterality Date   ANTERIOR VITRECTOMY Left 10/07/2021   Procedure: ANTERIOR VITRECTOMY;  Surgeon: Leandrew Koyanagi, MD;  Location: [facility];  Service: Ophthalmology;  Laterality: Left;   CATARACT EXTRACTION W/PHACO Left 10/07/2021   Procedure: CATARACT EXTRACTION PHACO AND INTRAOCULAR LENS PLACEMENT ([place]) LEFT VISION BLUE;  Surgeon: Leandrew Koyanagi, MD;  Location: [facility];  Service: Ophthalmology;  Laterality: Left;  kit for manual incision 14.56 01:21.4   CATARACT EXTRACTION W/PHACO Right 10/21/2021   Procedure: CATARACT EXTRACTION PHACO AND INTRAOCULAR LENS PLACEMENT (IOC) RIGHT 11.12 01:48.1;  Surgeon: Leandrew Koyanagi, MD;  Location: [facility];  Service: Ophthalmology;  Laterality: Right;   ESOPHAGOGASTRODUODENOSCOPY (EGD) WITH PROPOFOL N/A 03/30/2019   Procedure: ESOPHAGOGASTRODUODENOSCOPY (EGD) WITH PROPOFOL;  Surgeon: Jonathon Bellows, MD;  Location: [facility];  Service: Gastroenterology;  Laterality: N/A;   IR FLUORO GUIDE PORT INSERTION RIGHT   12/16/2017   IR IMAGING GUIDED PORT INSERTION  06/15/2021   THYROIDECTOMY N/A     FAMILY HISTORY :   Family History  Problem Relation Age of Onset   Pneumonia Mother    Seizures Father     SOCIAL HISTORY:   Social History   Tobacco Use   Smoking status: Former    Packs/day: 0.25    Types: Cigarettes    Quit date: 03/18/2021    Years since quitting: 0.9   Smokeless tobacco: Never   Tobacco comments:    2 cigarettes QOD  Vaping Use   Vaping Use: Never used  Substance Use Topics   Alcohol use: No   Drug use: No    ALLERGIES:  has No Known Allergies.  MEDICATIONS:  Current Outpatient Medications  Medication Sig Dispense Refill   acyclovir (ZOVIRAX) 400 MG tablet Take 1 tablet (400 mg total) by mouth 2 (two) times daily. 60 tablet 3   albuterol (VENTOLIN HFA) 108 (90 Base) MCG/ACT inhaler Inhale 2 puffs into the lungs every 6 (six) hours as needed for wheezing or shortness of breath. 8 g 3   aspirin EC 81 MG EC tablet Take 1 tablet (81 mg total) by mouth daily. 90 tablet 3   buPROPion (WELLBUTRIN) 75 MG tablet Take 75 mg by mouth daily.     calcium-vitamin D (OSCAL WITH D) 500MG-200UNIT (5MCG) tablet Take 1 tablet by mouth 2 (two) times daily. 60 tablet 0   carvedilol (COREG) 3.125 MG tablet Take 1 tablet (3.125 mg total) by mouth 2 (two) times daily with a meal. 60 tablet 5   clotrimazole-betamethasone (LOTRISONE) cream Apply topically 2 (two) times daily. 30 g 0   dexamethasone (DECADRON) 4 MG tablet Start 2 days prior to infusion; Take for 2 days. Do not take on the day of infusion. 60 tablet 3   Ensure  Plus (ENSURE PLUS) LIQD Take 237 mLs by mouth 3 (three) times a week.     furosemide (LASIX) 40 MG tablet Take 1 tablet (40 mg total) by mouth daily. 90 tablet 3   lenalidomide (REVLIMID) 15 MG capsule Take 1 capsule (15 mg total) by mouth daily. Take for 21 days, then hold for 7 days. Repeat every 28 days. 21 capsule 0   levothyroxine (SYNTHROID) 100 MCG tablet Take 1  tablet (100 mcg total) by mouth daily before breakfast. 30 tablet 3   lidocaine-prilocaine (EMLA) cream Apply 1 application topically as needed. Apply to port and cover with saran wrap 1-2 hours prior to port access 30 g 1   loperamide (IMODIUM) 2 MG capsule Take 2 mg by mouth as needed for diarrhea or loose stools.     magnesium oxide (MAG-OX) 400 (240 Mg) MG tablet Take 1 tablet (400 mg total) by mouth 2 (two) times daily. 60 tablet 3   montelukast (SINGULAIR) 10 MG tablet Take 1 tablet (10 mg total) by mouth at bedtime. 30 tablet 3   ondansetron (ZOFRAN) 8 MG tablet One pill every 8 hours as needed for nausea/vomitting. 40 tablet 1   pantoprazole (PROTONIX) 40 MG tablet Take 1 tablet by mouth once daily 90 tablet 0   polyethylene glycol (MIRALAX / GLYCOLAX) packet Take 17 g by mouth daily as needed for mild constipation. 14 each 0   potassium chloride SA (KLOR-CON) 20 MEQ tablet Take 1 tablet (20 mEq total) by mouth 2 (two) times daily. 60 tablet 6   spironolactone (ALDACTONE) 25 MG tablet Take 1 tablet by mouth once daily 90 tablet 0   traMADol (ULTRAM) 50 MG tablet TAKE 1 TABLET BY MOUTH EVERY 12 HOURS AS NEEDED 60 tablet 0   Vitamin D, Ergocalciferol, (DRISDOL) 1.25 MG (50000 UNIT) CAPS capsule Take 1 capsule by mouth once a week 12 capsule 0   No current facility-administered medications for this visit.   Facility-Administered Medications Ordered in Other Visits  Medication Dose Route Frequency Provider Last Rate Last Admin   0.9 %  sodium chloride infusion   Intravenous Continuous Monia Sabal, PA-C       heparin lock flush 100 unit/mL  500 Units Intravenous Once [name], MD        PHYSICAL EXAMINATION: ECOG PERFORMANCE STATUS: 1 - Symptomatic but completely ambulatory  BP 96/83   Pulse (!) 51   Temp 98.3 F (36.8 C)   Resp 18   Ht _0  (1.575 m)   Wt 179 lb (81.2 kg)   BMI 32.74 kg/m   Filed Weights   02/25/22 0929  Weight: 179 lb (81.2 kg)      Physical Exam HENT:     Head: Normocephalic and atraumatic.  Eyes:     Pupils: Pupils are equal, round, and reactive to light.  Cardiovascular:     Rate and Rhythm: Normal rate and regular rhythm.  Pulmonary:     Effort: Pulmonary effort is normal. No respiratory distress.     Breath sounds: Normal breath sounds. No wheezing.  Abdominal:     General: Bowel sounds are normal. There is no distension.     Palpations: Abdomen is soft. There is no mass.     Tenderness: There is no abdominal tenderness. There is no guarding or rebound.  Musculoskeletal:        General: No tenderness. Normal range of motion.     Cervical back: Normal range of motion and neck supple.  Skin:    General: Skin is warm.  Neurological:     Mental Status: She is alert and oriented to person, place, and time.  Psychiatric:        Mood and Affect: Affect normal.    LABORATORY DATA:  I have reviewed the data as listed    Component Value Date/Time   NA 138 02/25/2022 0900   NA 141 01/25/2020 1530   K 3.5 02/25/2022 0900   CL 105 02/25/2022 0900   CO2 21 (L) 02/25/2022 0900   GLUCOSE 141 (H) 02/25/2022 0900   BUN 17 02/25/2022 0900   BUN 19 01/25/2020 1530   CREATININE 0.99 02/25/2022 0900   CALCIUM 8.8 (L) 02/25/2022 0900   PROT 7.7 02/25/2022 0900   ALBUMIN 3.8 02/25/2022 0900   AST 25 02/25/2022 0900   ALT 12 02/25/2022 0900   ALKPHOS 58 02/25/2022 0900   BILITOT 0.6 02/25/2022 0900   GFRNONAA >60 02/25/2022 0900   GFRAA NOT CALCULATED 04/17/2020 1352    No results found for: "SPEP", "UPEP"  Lab Results  Component Value Date   WBC 8.6 02/25/2022   NEUTROABS 6.8 02/25/2022   HGB 9.5 (L) 02/25/2022   HCT 30.0 (L) 02/25/2022   MCV 94.0 02/25/2022   PLT 241 02/25/2022      Chemistry      Component Value Date/Time   NA 138 02/25/2022 0900   NA 141 01/25/2020 1530   K 3.5 02/25/2022 0900   CL 105 02/25/2022 0900   CO2 21 (L) 02/25/2022 0900   BUN 17 02/25/2022 0900   BUN 19  01/25/2020 1530   CREATININE 0.99 02/25/2022 0900      Component Value Date/Time   CALCIUM 8.8 (L) 02/25/2022 0900   ALKPHOS 58 02/25/2022 0900   AST 25 02/25/2022 0900   ALT 12 02/25/2022 0900   BILITOT 0.6 02/25/2022 0900      (H): Data is abnormally high   Latest Reference Range & Units Most Recent 12/24/20 14:01 04/01/21 14:34 04/28/21 09:17 06/11/21 09:42 07/03/21 10:32 08/20/21 10:06  M Protein SerPl Elph-Mcnc Not Observed g/dL 1.1 (H) (C) 08/20/21 10:06 Not Observed (C) 0.6 (H) (C) 0.7 (H) (C) 0.5 (H) (C) 0.7 (H) (C) 1.1 (H) (C)    Latest Reference Range & Units Most Recent 09/17/21 08:47 10/15/21 08:25 11/12/21 08:55  Kappa free light chain 3.3 - 19.4 mg/L 5.0 11/12/21 08:55 7.2 7.2 5.0  Lambda free light chains 5.7 - 26.3 mg/L 223.6 (H) 11/12/21 08:55 215.3 (H) 340.2 (H) 223.6 (H)  Kappa, lambda light chain ratio 0.26 - 1.65  0.02 (L) 11/12/21 08:55 0.03 (L) 0.02 (L) 0.02 (L)  (H): Data is abnormally high (L): Data is abnormally low  RADIOGRAPHIC STUDIES: I have personally reviewed the radiological images as listed and agreed with the findings in the report. No results found.   ASSESSMENT & PLAN:  Encounter Diagnoses  Name Primary?   Multiple myeloma in relapse (HCC) Yes   Hypokalemia    Port-A-Cath in place    Hypocalcemia    Chronic. Stable. Continue current treatment course. Labs reviewed and acceptable for treatment. Dara today. Follow up in 2 weeks or sooner as needed Chronic and improved. Continue current supplementation.  Chronic in nature. Had port flush today.  Chronic and stable. Secondary to medications. Continue calcium rich diet.    No orders of the defined types were placed in this encounter.    All questions were answered. The patient knows to call the clinic with  any problems, questions or concerns.      Hughie Closs, PA-C 02/25/2022 10:02 AM

## 2022-03-09 ENCOUNTER — Other Ambulatory Visit: Payer: Self-pay | Admitting: *Deleted

## 2022-03-09 DIAGNOSIS — C9002 Multiple myeloma in relapse: Secondary | ICD-10-CM

## 2022-03-09 NOTE — Telephone Encounter (Signed)
Authorization Number: [id number] This Authorization is valid for: 30 days Authorization Status: Active Remember to have your patient take her survey every six months. Her last survey was on October 19, 2021 and her next survey will be available on March 29, 2022. Your prescription authorization number is [id number]. This number is only valid for 30 days. Please write this authorization number and the patient's risk category on the patient's prescription.

## 2022-03-10 ENCOUNTER — Encounter: Payer: Self-pay | Admitting: Internal Medicine

## 2022-03-10 ENCOUNTER — Other Ambulatory Visit: Payer: Self-pay | Admitting: *Deleted

## 2022-03-10 MED ORDER — LEVOTHYROXINE SODIUM 100 MCG PO TABS: 100.0000 ug | ORAL_TABLET | Freq: Every day | ORAL | 3 refills | Status: DC

## 2022-03-10 MED ORDER — LENALIDOMIDE 15 MG PO CAPS: 15.0000 mg | ORAL_CAPSULE | Freq: Every day | ORAL | 0 refills | Status: DC

## 2022-03-10 NOTE — Telephone Encounter (Signed)
Thyroid Panel With TSH Order: [id number] Status: Final result    Visible to patient: No (inaccessible in [place])    Next appt: 03/11/2022 at 08:15 AM in Oncology ([facility]-PORT FLUSH)    Dx: Multiple myeloma not having achieved ...    0 Result Notes           Component Ref Range & Units 3 mo ago (12/10/21) 3 mo ago (11/26/21) 3 mo ago (11/12/21) 5 mo ago (09/17/21) 6 mo ago (08/20/21) 8 mo ago (07/03/21) 9 mo ago (05/27/21)  TSH 0.450 - 4.500 uIU/mL 2.420  0.279 Low  R, CM  0.302 Low  R, CM   8.777 High  R, CM  10.700 High   2.285 R, CM   T4, Total 4.5 - 12.0 ug/dL 4.9    8.3   8.4    T3 Uptake Ratio 24 - 39 % 23 Low     20 Low    25    Free Thyroxine Index 1.2 - 4.9 1.1 Low     1.7 CM   2.1 CM    Comment: (NOTE)  Performed At: [facility]  [address] [place], [place] [id number]  Rush Farmer MD OL:[id number]   Resulting Agency  [facility]         Specimen Collected: 12/10/21 08:24 Last Resulted: 12/11/21 14:36

## 2022-03-11 ENCOUNTER — Inpatient Hospital Stay (HOSPITAL_BASED_OUTPATIENT_CLINIC_OR_DEPARTMENT_OTHER): Payer: Medicare Other | Admitting: Internal Medicine

## 2022-03-11 ENCOUNTER — Inpatient Hospital Stay: Payer: Medicare Other

## 2022-03-11 ENCOUNTER — Encounter: Payer: Self-pay | Admitting: Internal Medicine

## 2022-03-11 VITALS — BP 136/97 | HR 97 | Temp 99.2°F | Resp 18 | Wt 176.8 lb

## 2022-03-11 DIAGNOSIS — C9000 Multiple myeloma not having achieved remission: Secondary | ICD-10-CM

## 2022-03-11 DIAGNOSIS — Z5112 Encounter for antineoplastic immunotherapy: Secondary | ICD-10-CM | POA: Diagnosis not present

## 2022-03-11 DIAGNOSIS — Z95828 Presence of other vascular implants and grafts: Secondary | ICD-10-CM

## 2022-03-11 LAB — CBC WITH DIFFERENTIAL/PLATELET
Abs Immature Granulocytes: 0.01 10*3/uL (ref 0.00–0.07)
Basophils Absolute: 0 10*3/uL (ref 0.0–0.1)
Basophils Relative: 0 %
Eosinophils Absolute: 0 10*3/uL (ref 0.0–0.5)
Eosinophils Relative: 0 %
HCT: 32.7 % — ABNORMAL LOW (ref 36.0–46.0)
Hemoglobin: 10.6 g/dL — ABNORMAL LOW (ref 12.0–15.0)
Immature Granulocytes: 0 %
Lymphocytes Relative: 28 %
Lymphs Abs: 1.1 10*3/uL (ref 0.7–4.0)
MCH: 30.5 pg (ref 26.0–34.0)
MCHC: 32.4 g/dL (ref 30.0–36.0)
MCV: 94 fL (ref 80.0–100.0)
Monocytes Absolute: 0 10*3/uL — ABNORMAL LOW (ref 0.1–1.0)
Monocytes Relative: 1 %
Neutro Abs: 2.8 10*3/uL (ref 1.7–7.7)
Neutrophils Relative %: 71 %
Platelets: 199 10*3/uL (ref 150–400)
RBC: 3.48 MIL/uL — ABNORMAL LOW (ref 3.87–5.11)
RDW: 16.7 % — ABNORMAL HIGH (ref 11.5–15.5)
WBC: 3.9 10*3/uL — ABNORMAL LOW (ref 4.0–10.5)
nRBC: 0 % (ref 0.0–0.2)

## 2022-03-11 LAB — COMPREHENSIVE METABOLIC PANEL
ALT: 14 U/L (ref 0–44)
AST: 30 U/L (ref 15–41)
Albumin: 4 g/dL (ref 3.5–5.0)
Alkaline Phosphatase: 73 U/L (ref 38–126)
Anion gap: 14 (ref 5–15)
BUN: 11 mg/dL (ref 8–23)
CO2: 18 mmol/L — ABNORMAL LOW (ref 22–32)
Calcium: 8.7 mg/dL — ABNORMAL LOW (ref 8.9–10.3)
Chloride: 105 mmol/L (ref 98–111)
Creatinine, Ser: 0.96 mg/dL (ref 0.44–1.00)
GFR, Estimated: >60 mL/min (ref >60)
Glucose, Bld: 143 mg/dL — ABNORMAL HIGH (ref 70–99)
Potassium: 3.3 mmol/L — ABNORMAL LOW (ref 3.5–5.1)
Sodium: 137 mmol/L (ref 135–145)
Total Bilirubin: 0.7 mg/dL (ref 0.3–1.2)
Total Protein: 8.6 g/dL — ABNORMAL HIGH (ref 6.5–8.1)

## 2022-03-11 MED ORDER — TRAMADOL HCL 50 MG PO TABS: 50.0000 mg | ORAL_TABLET | Freq: Two times a day (BID) | ORAL | 0 refills | Status: DC | PRN

## 2022-03-11 MED ORDER — DEXAMETHASONE 4 MG PO TABS
20.0000 mg | ORAL_TABLET | Freq: Once | ORAL | Status: AC
  Administered 2022-03-11: 20 mg via ORAL
  Filled 2022-03-11: qty 5

## 2022-03-11 MED ORDER — MONTELUKAST SODIUM 10 MG PO TABS
10.0000 mg | ORAL_TABLET | Freq: Once | ORAL | Status: AC
  Administered 2022-03-11: 10 mg via ORAL
  Filled 2022-03-11: qty 1

## 2022-03-11 MED ORDER — DIPHENHYDRAMINE HCL 25 MG PO CAPS
50.0000 mg | ORAL_CAPSULE | Freq: Once | ORAL | Status: AC
  Administered 2022-03-11: 50 mg via ORAL
  Filled 2022-03-11: qty 2

## 2022-03-11 MED ORDER — HEPARIN SOD (PORK) LOCK FLUSH 100 UNIT/ML IV SOLN
500.0000 [IU] | Freq: Once | INTRAVENOUS | Status: AC
  Administered 2022-03-11: 500 [IU] via INTRAVENOUS
  Filled 2022-03-11: qty 5

## 2022-03-11 MED ORDER — DARATUMUMAB-HYALURONIDASE-FIHJ 1800-30000 MG-UT/15ML ~~LOC~~ SOLN
1800.0000 mg | Freq: Once | SUBCUTANEOUS | Status: AC
  Administered 2022-03-11: 1800 mg via SUBCUTANEOUS
  Filled 2022-03-11: qty 15

## 2022-03-11 MED ORDER — ACETAMINOPHEN 325 MG PO TABS
650.0000 mg | ORAL_TABLET | Freq: Once | ORAL | Status: AC
  Administered 2022-03-11: 650 mg via ORAL
  Filled 2022-03-11: qty 2

## 2022-03-11 NOTE — Progress Notes (Signed)
Pt states at times she has difficulty standing up; states she can not stand for long periods at a time. Does not bother her legs; but causes discomfort in her lower back pain. Discomfort comes at random times with no indication. Not able to bend over.

## 2022-03-11 NOTE — Progress Notes (Signed)
[facility] OFFICE PROGRESS NOTE  Patient Care Team: Marguerita Merles, MD as PCP - General (Family Medicine) End, Harrell Gave, MD as PCP - Cardiology (Cardiology) [name], [name] [name], MD as Consulting Physician (Hematology and Oncology) Ottie Glazier, MD as Consulting Physician (Pulmonary Disease) Gabriel Carina Betsey Holiday, MD as Consulting Physician (Endocrinology)   Cancer Staging  No matching staging information was found for the patient.   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 No morphologic support for residual myeloma disease. Negative for minimal residual disease by MM-MRD flow cytometry; HOLD REVLIMID [leg swelling]; AUG 2021-PET scan negative for myeloma; continue to hold Revlimid  # MARCH 2021-diastolic congestive heart failure [Dr.End]  # BMBx- OCT 2021- BMBx- 5% plasmacytosis-however this appears to be more polyclonal rather than monoclonal-not explain patient worsening anemia.   # OCT 2022- 18th- Dara- Rev-Dex  #November 2021-hyperthyroidism/goiter-  [D.Bennett/Dr.Solum]-methimazole. S/p Thyroidectomy [Dr.Kim; [facility] 25909] --------------------------  # 12/12- RIGHT JUGULAR DVT-x 355mn xarelto; finished April 2020; September 2020-EGD/dysphagia; Dr. AnVicente Males# Acute renal failure [Dr.Singh; Proteinuria 1.5gm/day ]; acyclovir/Asprin ------------------------------------------------------------------------------------------------------------   DIAGNOSIS: _0  MULTIPLE MYELOMA  STAGE: III/HIGH RISK ;GOALS: CONTROL  CURRENT/MOST RECENT THERAPY-maintenance Revlimid- on HOLD    Multiple myel[place] not having achieved remission ([facility] 11/21/2017 - 04/28/2018 Chemotherapy   Patient is on Treatment Plan : MYELOMA SALVAGE Cyclophosphamide / Carfilzomib / Dexamethasone (CCd) q28d     05/12/2021 - 02/25/2022 Chemotherapy   Patient is on Treatment Plan : MYELOMA RELAPSED REFRACTORY Daratumumab SQ + Lenalidomide + Dexamethasone (DaraRd) q28d     05/12/2021 -  Chemotherapy   Patient is on Treatment Plan : MYELOMA RELAPSED REFRACTORY Daratumumab SQ + Lenalidomide + Dexamethasone (DaraRd) q28d      INTERVAL HISTORY: Ambulating independently.  Alone.  [name]51.o.  female pleasant patient above history of relapsed multiple myeloma--currently on daratumumab-Revlimid-dexamethasone  is here for follow-up.  Continues to complain of intermittent cramping/muscle spasm.  Chronic mild muscle spasms needing tramadol every 12 hours also.   Ongoing cough not any worse.  No chest pain or shortness of breath.  Review of Systems  Constitutional:  Positive for malaise/fatigue. Negative for chills, diaphoresis and fever.  HENT:  Negative for nosebleeds and sore throat.   Eyes:  Negative for double vision.  Respiratory:  Negative for hemoptysis.   Cardiovascular:  Negative for chest pain, palpitations and orthopnea.  Gastrointestinal:  Negative for abdominal pain, blood in stool, constipation, heartburn and melena.  Genitourinary:  Negative for dysuria,  frequency and urgency.  Musculoskeletal:  Positive for back pain.  Skin: Negative.  Negative for itching and rash.  Neurological:  Negative for dizziness, tingling, focal weakness, weakness and headaches.  Endo/Heme/Allergies:  Does not bruise/bleed easily.  Psychiatric/Behavioral:  Negative for depression. The patient is not nervous/anxious.     PAST MEDICAL HISTORY :  Past Medical History:  Diagnosis Date   (HFpEF) heart failure with preserved ejection fraction ([place])    a. 06/2020 Echo: EF 60-65%, no rwma, nl RV size/fxn. Triv MR. Triv TR/AI. Mod elev PASP.   Anxiety    COPD (chronic obstructive pulmonary disease) (HCC)    GERD (gastroesophageal reflux disease)    Hypertension    Hyperthyroidism    Hypokalemia    IgA myeloma (HCC)    Multiple myeloma (HCC)    Pneumonia    Red blood cell antibody positive, compatible PRBC difficult to obtain    S/P autologous bone marrow transplantation ([place])     PAST SURGICAL HISTORY :   Past Surgical History:  Procedure Laterality Date   ANTERIOR VITRECTOMY Left 10/07/2021   Procedure: ANTERIOR VITRECTOMY;  Surgeon: Leandrew Koyanagi, MD;  Location: [facility];  Service: Ophthalmology;  Laterality: Left;   CATARACT EXTRACTION W/PHACO Left 10/07/2021   Procedure: CATARACT EXTRACTION PHACO AND INTRAOCULAR LENS PLACEMENT ([place]) LEFT VISION BLUE;  Surgeon: Leandrew Koyanagi, MD;  Location: [facility];  Service: Ophthalmology;  Laterality: Left;  kit for manual incision 14.56 01:21.4   CATARACT EXTRACTION W/PHACO Right 10/21/2021   Procedure: CATARACT EXTRACTION PHACO AND INTRAOCULAR LENS PLACEMENT ([facility]) RIGHT 11.12 01:48.1;  Surgeon: Leandrew Koyanagi, MD;  Location: [facility];  Service: Ophthalmology;  Laterality: Right;   ESOPHAGOGASTRODUODENOSCOPY (EGD) WITH PROPOFOL N/A 03/30/2019   Procedure: ESOPHAGOGASTRODUODENOSCOPY (EGD) WITH PROPOFOL;  Surgeon: Jonathon Bellows, MD;  Location: [facility] ENDOSCOPY;  Service:  Gastroenterology;  Laterality: N/A;   IR FLUORO GUIDE PORT INSERTION RIGHT  12/16/2017   IR IMAGING GUIDED PORT INSERTION  06/15/2021   THYROIDECTOMY N/A     FAMILY HISTORY :   Family History  Problem Relation Age of Onset   Pneumonia Mother    Seizures Father     SOCIAL HISTORY:   Social History   Tobacco Use   Smoking status: Former    Packs/day: 0.25    Types: Cigarettes    Quit date: 03/18/2021    Years since quitting: 0.9   Smokeless tobacco: Never   Tobacco comments:    2 cigarettes QOD  Vaping Use   Vaping Use: Never used  Substance Use Topics   Alcohol use: No   Drug use: No    ALLERGIES:  has No Known Allergies.  MEDICATIONS:  Current Outpatient Medications  Medication Sig Dispense Refill   acyclovir (ZOVIRAX) 400 MG tablet Take 1 tablet (400 mg total) by mouth 2 (two) times daily. 60 tablet 3   albuterol (VENTOLIN HFA) 108 (90 Base) MCG/ACT inhaler Inhale 2 puffs into the lungs every 6 (six) hours as needed for wheezing or shortness of breath. 8 g 3   aspirin EC 81 MG EC tablet Take 1 tablet (81 mg total) by mouth daily. 90 tablet 3   buPROPion (WELLBUTRIN) 75 MG tablet Take 75 mg by mouth daily.     calcium-vitamin D (OSCAL WITH D) $Remov'500MG'wwbkEz$ -200UNIT (5MCG) tablet Take 1 tablet by mouth 2 (two) times daily. 60 tablet 0   carvedilol (COREG) 3.125 MG tablet Take 1 tablet (3.125 mg total) by mouth 2 (two) times daily with a meal. 60 tablet 5   clotrimazole-betamethasone (LOTRISONE) cream Apply topically 2 (two) times daily. 30 g 0   dexamethasone (DECADRON) 4 MG tablet Start 2 days prior to infusion; Take for 2 days. Do not take on the  day of infusion. 60 tablet 3   Ensure Plus (ENSURE PLUS) LIQD Take 237 mLs by mouth 3 (three) times a week.     furosemide (LASIX) 40 MG tablet Take 1 tablet (40 mg total) by mouth daily. 90 tablet 3   lenalidomide (REVLIMID) 15 MG capsule Take 1 capsule (15 mg total) by mouth daily. Take for 21 days, then hold for 7 days. Repeat every  28 days. 21 capsule 0   levothyroxine (SYNTHROID) 100 MCG tablet Take 1 tablet (100 mcg total) by mouth daily before breakfast. 30 tablet 3   lidocaine-prilocaine (EMLA) cream Apply 1 application topically as needed. Apply to port and cover with saran wrap 1-2 hours prior to port access 30 g 1   loperamide (IMODIUM) 2 MG capsule Take 2 mg by mouth as needed for diarrhea or loose stools.     magnesium oxide (MAG-OX) 400 (240 Mg) MG tablet Take 1 tablet (400 mg total) by mouth 2 (two) times daily. 60 tablet 3   montelukast (SINGULAIR) 10 MG tablet Take 1 tablet (10 mg total) by mouth at bedtime. 30 tablet 3   pantoprazole (PROTONIX) 40 MG tablet Take 1 tablet (40 mg total) by mouth daily. 90 tablet 0   polyethylene glycol (MIRALAX / GLYCOLAX) packet Take 17 g by mouth daily as needed for mild constipation. 14 each 0   potassium chloride SA (KLOR-CON M) 20 MEQ tablet Take 1 tablet (20 mEq total) by mouth 2 (two) times daily. 60 tablet 6   spironolactone (ALDACTONE) 25 MG tablet Take 1 tablet by mouth once daily 90 tablet 0   Vitamin D, Ergocalciferol, (DRISDOL) 1.25 MG (50000 UNIT) CAPS capsule Take 1 capsule by mouth once a week 12 capsule 0   ondansetron (ZOFRAN) 8 MG tablet One pill every 8 hours as needed for nausea/vomitting. (Patient not taking: Reported on 03/11/2022) 40 tablet 1   traMADol (ULTRAM) 50 MG tablet Take 1 tablet (50 mg total) by mouth every 12 (twelve) hours as needed. 60 tablet 0   No current facility-administered medications for this visit.   Facility-Administered Medications Ordered in Other Visits  Medication Dose Route Frequency Provider Last Rate Last Admin   0.9 %  sodium chloride infusion   Intravenous Continuous Monia Sabal, PA-C        PHYSICAL EXAMINATION: ECOG PERFORMANCE STATUS: 1 - Symptomatic but completely ambulatory  BP (!) 136/97   Pulse 97   Temp 99.2 F (37.3 C)   Resp 18   Wt 176 lb 12.8 oz (80.2 kg)   SpO2 98%   BMI 32.34 kg/m   Filed  Weights   03/11/22 0913  Weight: 176 lb 12.8 oz (80.2 kg)     Physical Exam HENT:     Head: Normocephalic and atraumatic.  Eyes:     Pupils: Pupils are equal, round, and reactive to light.  Cardiovascular:     Rate and Rhythm: Normal rate and regular rhythm.  Pulmonary:     Effort: Pulmonary effort is normal. No respiratory distress.     Breath sounds: Normal breath sounds. No wheezing.  Abdominal:     General: Bowel sounds are normal. There is no distension.     Palpations: Abdomen is soft. There is no mass.     Tenderness: There is no abdominal tenderness. There is no guarding or rebound.  Musculoskeletal:        General: No tenderness. Normal range of motion.     Cervical back: Normal range of motion and  neck supple.  Skin:    General: Skin is warm.  Neurological:     Mental Status: She is alert and oriented to person, place, and time.  Psychiatric:        Mood and Affect: Affect normal.    LABORATORY DATA:  I have reviewed the data as listed    Component Value Date/Time   NA 137 03/11/2022 0851   NA 141 01/25/2020 1530   K 3.3 (L) 03/11/2022 0851   CL 105 03/11/2022 0851   CO2 18 (L) 03/11/2022 0851   GLUCOSE 143 (H) 03/11/2022 0851   BUN 11 03/11/2022 0851   BUN 19 01/25/2020 1530   CREATININE 0.96 03/11/2022 0851   CALCIUM 8.7 (L) 03/11/2022 0851   PROT 8.6 (H) 03/11/2022 0851   ALBUMIN 4.0 03/11/2022 0851   AST 30 03/11/2022 0851   ALT 14 03/11/2022 0851   ALKPHOS 73 03/11/2022 0851   BILITOT 0.7 03/11/2022 0851   GFRNONAA >60 03/11/2022 0851   GFRAA NOT CALCULATED 04/17/2020 1352    No results found for: "SPEP", "UPEP"  Lab Results  Component Value Date   WBC 3.9 (L) 03/11/2022   NEUTROABS 2.8 03/11/2022   HGB 10.6 (L) 03/11/2022   HCT 32.7 (L) 03/11/2022   MCV 94.0 03/11/2022   PLT 199 03/11/2022      Chemistry      Component Value Date/Time   NA 137 03/11/2022 0851   NA 141 01/25/2020 1530   K 3.3 (L) 03/11/2022 0851   CL 105  03/11/2022 0851   CO2 18 (L) 03/11/2022 0851   BUN 11 03/11/2022 0851   BUN 19 01/25/2020 1530   CREATININE 0.96 03/11/2022 0851      Component Value Date/Time   CALCIUM 8.7 (L) 03/11/2022 0851   ALKPHOS 73 03/11/2022 0851   AST 30 03/11/2022 0851   ALT 14 03/11/2022 0851   BILITOT 0.7 03/11/2022 0851      (H): Data is abnormally high   Latest Reference Range & Units Most Recent 12/24/20 14:01 04/01/21 14:34 04/28/21 09:17 06/11/21 09:42 07/03/21 10:32 08/20/21 10:06  M Protein SerPl Elph-Mcnc Not Observed g/dL 1.1 (H) (C) 08/20/21 10:06 Not Observed (C) 0.6 (H) (C) 0.7 (H) (C) 0.5 (H) (C) 0.7 (H) (C) 1.1 (H) (C)    Latest Reference Range & Units Most Recent 09/17/21 08:47 10/15/21 08:25 11/12/21 08:55  Kappa free light chain 3.3 - 19.4 mg/L 5.0 11/12/21 08:55 7.2 7.2 5.0  Lambda free light chains 5.7 - 26.3 mg/L 223.6 (H) 11/12/21 08:55 215.3 (H) 340.2 (H) 223.6 (H)  Kappa, lambda light chain ratio 0.26 - 1.65  0.02 (L) 11/12/21 08:55 0.03 (L) 0.02 (L) 0.02 (L)  (H): Data is abnormally high (L): Data is abnormally low  RADIOGRAPHIC STUDIES: I have personally reviewed the radiological images as listed and agreed with the findings in the report. No results found.   ASSESSMENT & PLAN:  Multiple myeloma not having achieved remission ([place]) # RECURRENT Multiple myeloma stage III [high-risk cytogenetics-status post KRD-VGPR ; status post autologous stem cell transplant on 06/15/18.  Currently recurrent-on salvage therapy. STABLE.   # Patient currently on salvage therapy with daratumumab  lenalidomide [15 mg 3 w; 1-w] dexamethasone.    # Continue cycle #6-day 1 Dara SQ weekly; continue REVLIMID 15 mg again. 3 weeks/1 week off; continue. Labs today reviewed;  acceptable for treatment today. JULY  2023- 0.6 gm; lamda LC= 146- overall STABLE/improving.   # Iatrogenic hypothyroidism [ Graves/goiter s/p total thyroidectomy;aug,2022 ]-  April 2023- TSH-LOW; decrease the dose of Synthroid to  100 mcg/day- MAY 2023- TSH- WNL.STABLE.  # Muscle cramps-?  Revlimid. improved  On tramadol q 12 hours prn-STABLE.  Refill tramadol.  # Chronic diastolic CHF-clinicallySTABLE.   # Bone lesions /last zometa on 11/27/2017.  Holding Zometa secondary to poor dentition.  Awaiting dental evaluation in AUG, 2023; will need hematology clearance prior patient dental extraction.  # IV access: mediport   :MM panel; K/l light chain ratio q 4 W  # DISPOSITION:  #  Dara today  #  Follow up in 2 weeks- MD ;port- labs-cbc/cmp;MM panel; K/L light chains- Dara SQ.   #  Follow up in 4 weeks- MD;Dara SQ; 'port- labs-cbc/cmp;- Thryoid profile MM panel; K/l light chain.  Dr.B     Orders Placed This Encounter  Procedures   CBC with Differential    Standing Status:   Future    Standing Expiration Date:   03/12/2023   CBC with Differential    Standing Status:   Future    Standing Expiration Date:   03/26/2023   Comprehensive metabolic panel    Standing Status:   Future    Standing Expiration Date:   03/26/2023   CBC with Differential/Platelet    Standing Status:   Future    Standing Expiration Date:   03/12/2023   Comprehensive metabolic panel    Standing Status:   Future    Standing Expiration Date:   03/12/2023   Multiple Myeloma Panel (SPEP&IFE w/QIG)    Standing Status:   Future    Standing Expiration Date:   03/12/2023   Kappa/lambda light chains    Standing Status:   Future    Standing Expiration Date:   03/12/2023     All questions were answered. The patient knows to call the clinic with any problems, questions or concerns.      [name], MD 03/11/2022 11:07 PM

## 2022-03-11 NOTE — Assessment & Plan Note (Addendum)
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  Currently recurrent-on salvage therapy. STABLE.   # Patient currently on salvage therapy with daratumumab  lenalidomide [15 mg 3 w; 1-w] dexamethasone.    # Continue cycle #6-day 1 Dara SQ weekly; continue REVLIMID 15 mg again. 3 weeks/1 week off; continue. Labs today reviewed;  acceptable for treatment today. JULY  2023- 0.6 gm; lamda LC= 146- overall STABLE/improving.   # Iatrogenic hypothyroidism [ Graves/goiter s/p total thyroidectomy;aug,2022 ]-  April 2023- TSH-LOW; decrease the dose of Synthroid to 100 mcg/day- MAY 2023- TSH- WNL.STABLE.  # Muscle cramps-?  Revlimid. improved  On tramadol q 12 hours prn-STABLE.  Refill tramadol.  # Chronic diastolic CHF-clinicallySTABLE.   # Bone lesions /last zometa on 11/27/2017.  Holding Zometa secondary to poor dentition.  Awaiting dental evaluation in AUG, 2023; will need hematology clearance prior patient dental extraction.  # IV access: mediport   :MM panel; K/l light chain ratio q 4 W  # DISPOSITION:  #  Dara today  #  Follow up in 2 weeks- MD ;port- labs-cbc/cmp;MM panel; K/L light chains- Dara SQ.   #  Follow up in 4 weeks- MD;Dara SQ; 'port- labs-cbc/cmp;- Thryoid profile MM panel; K/l light chain.  Dr.B

## 2022-03-11 NOTE — Patient Instructions (Signed)
[facility]  Discharge Instructions: Thank you for choosing [facility] to provide your oncology and hematology care.  If you have a lab appointment with the Cancer Center, please go directly to the Cancer Center and check in at the registration area.  Wear comfortable clothing and clothing appropriate for easy access to any Portacath or PICC line.   We strive to give you quality time with your provider. You may need to reschedule your appointment if you arrive late (15 or more minutes).  Arriving late affects you and other patients whose appointments are after yours.  Also, if you miss three or more appointments without notifying the office, you may be dismissed from the clinic at the provider's discretion.      For prescription refill requests, have your pharmacy contact our office and allow 72 hours for refills to be completed.       To help prevent nausea and vomiting after your treatment, we encourage you to take your nausea medication as directed.  BELOW ARE SYMPTOMS THAT SHOULD BE REPORTED IMMEDIATELY: *FEVER GREATER THAN 100.4 F (38 C) OR HIGHER *CHILLS OR SWEATING *NAUSEA AND VOMITING THAT IS NOT CONTROLLED WITH YOUR NAUSEA MEDICATION *UNUSUAL SHORTNESS OF BREATH *UNUSUAL BRUISING OR BLEEDING *URINARY PROBLEMS (pain or burning when urinating, or frequent urination) *BOWEL PROBLEMS (unusual diarrhea, constipation, pain near the anus) TENDERNESS IN MOUTH AND THROAT WITH OR WITHOUT PRESENCE OF ULCERS (sore throat, sores in mouth, or a toothache) UNUSUAL RASH, SWELLING OR PAIN  UNUSUAL VAGINAL DISCHARGE OR ITCHING   Items with * indicate a potential emergency and should be followed up as soon as possible or go to the Emergency Department if any problems should occur.  Please show the CHEMOTHERAPY ALERT CARD or IMMUNOTHERAPY ALERT CARD at check-in to the Emergency Department and triage nurse.  Should you have questions after your  visit or need to cancel or reschedule your appointment, please contact [facility]  [phone] and follow the prompts.  Office hours are 8:00 a.m. to 4:30 p.m. Monday - Friday. Please note that voicemails left after 4:00 p.m. may not be returned until the following business day.  We are closed weekends and major holidays. You have access to a nurse at all times for urgent questions. Please call the main number to the clinic [phone] and follow the prompts.  For any non-urgent questions, you may also contact your provider using MyChart. We now offer e-Visits for anyone 18 and older to request care online for non-urgent symptoms. For details visit [email or web address].   Also download the MyChart app! Go to the app store, search "MyChart", open the app, select [facility], and log in with your MyChart username and password.  Masks are optional in the cancer centers. If you would like for your care team to wear a mask while they are taking care of you, please let them know. For doctor visits, patients may have with them one support person who is at least 70 years old. At this time, visitors are not allowed in the infusion area.   

## 2022-03-12 ENCOUNTER — Other Ambulatory Visit: Payer: Self-pay

## 2022-03-12 LAB — THYROID PANEL WITH TSH
Free Thyroxine Index: 1.4 (ref 1.2–4.9)
T3 Uptake Ratio: 22 % — ABNORMAL LOW (ref 24–39)
T4, Total: 6.4 ug/dL (ref 4.5–12.0)
TSH: 3.71 u[IU]/mL (ref 0.450–4.500)

## 2022-03-12 LAB — KAPPA/LAMBDA LIGHT CHAINS
Kappa free light chain: 3.6 mg/L (ref 3.3–19.4)
Kappa, lambda light chain ratio: 0.02 — ABNORMAL LOW (ref 0.26–1.65)
Lambda free light chains: 158.1 mg/L — ABNORMAL HIGH (ref 5.7–26.3)

## 2022-03-16 LAB — MULTIPLE MYELOMA PANEL, SERUM
Albumin SerPl Elph-Mcnc: 3.9 g/dL (ref 2.9–4.4)
Albumin/Glob SerPl: 1 (ref 0.7–1.7)
Alpha 1: 0.1 g/dL (ref 0.0–0.4)
Alpha2 Glob SerPl Elph-Mcnc: 0.8 g/dL (ref 0.4–1.0)
B-Globulin SerPl Elph-Mcnc: 1.6 g/dL — ABNORMAL HIGH (ref 0.7–1.3)
Gamma Glob SerPl Elph-Mcnc: 1.5 g/dL (ref 0.4–1.8)
Globulin, Total: 4.1 g/dL — ABNORMAL HIGH (ref 2.2–3.9)
IgA: 1251 mg/dL — ABNORMAL HIGH (ref 87–352)
IgG (Immunoglobin G), Serum: 466 mg/dL — ABNORMAL LOW (ref 586–1602)
IgM (Immunoglobulin M), Srm: 8 mg/dL — ABNORMAL LOW (ref 26–217)
M Protein SerPl Elph-Mcnc: 0.8 g/dL — ABNORMAL HIGH
Total Protein ELP: 8 g/dL (ref 6.0–8.5)

## 2022-03-25 ENCOUNTER — Inpatient Hospital Stay: Payer: Medicare Other

## 2022-03-25 ENCOUNTER — Encounter: Payer: Self-pay | Admitting: Internal Medicine

## 2022-03-25 ENCOUNTER — Inpatient Hospital Stay (HOSPITAL_BASED_OUTPATIENT_CLINIC_OR_DEPARTMENT_OTHER): Payer: Medicare Other | Admitting: Internal Medicine

## 2022-03-25 VITALS — BP 134/80 | HR 59 | Temp 96.9°F | Ht 62.0 in | Wt 178.2 lb

## 2022-03-25 DIAGNOSIS — C9000 Multiple myeloma not having achieved remission: Secondary | ICD-10-CM | POA: Diagnosis not present

## 2022-03-25 DIAGNOSIS — Z5112 Encounter for antineoplastic immunotherapy: Secondary | ICD-10-CM | POA: Diagnosis not present

## 2022-03-25 LAB — CBC WITH DIFFERENTIAL/PLATELET
Abs Immature Granulocytes: 0.03 10*3/uL (ref 0.00–0.07)
Basophils Absolute: 0 10*3/uL (ref 0.0–0.1)
Basophils Relative: 0 %
Eosinophils Absolute: 0 10*3/uL (ref 0.0–0.5)
Eosinophils Relative: 0 %
HCT: 30.9 % — ABNORMAL LOW (ref 36.0–46.0)
Hemoglobin: 9.9 g/dL — ABNORMAL LOW (ref 12.0–15.0)
Immature Granulocytes: 1 %
Lymphocytes Relative: 26 %
Lymphs Abs: 1.2 10*3/uL (ref 0.7–4.0)
MCH: 29.9 pg (ref 26.0–34.0)
MCHC: 32 g/dL (ref 30.0–36.0)
MCV: 93.4 fL (ref 80.0–100.0)
Monocytes Absolute: 0 10*3/uL — ABNORMAL LOW (ref 0.1–1.0)
Monocytes Relative: 1 %
Neutro Abs: 3.2 10*3/uL (ref 1.7–7.7)
Neutrophils Relative %: 72 %
Platelets: 262 10*3/uL (ref 150–400)
RBC: 3.31 MIL/uL — ABNORMAL LOW (ref 3.87–5.11)
RDW: 16.3 % — ABNORMAL HIGH (ref 11.5–15.5)
WBC: 4.5 10*3/uL (ref 4.0–10.5)
nRBC: 0 % (ref 0.0–0.2)

## 2022-03-25 LAB — COMPREHENSIVE METABOLIC PANEL WITH GFR
ALT: 25 U/L (ref 0–44)
AST: 47 U/L — ABNORMAL HIGH (ref 15–41)
Albumin: 3.6 g/dL (ref 3.5–5.0)
Alkaline Phosphatase: 81 U/L (ref 38–126)
Anion gap: 15 (ref 5–15)
BUN: 11 mg/dL (ref 8–23)
CO2: 21 mmol/L — ABNORMAL LOW (ref 22–32)
Calcium: 8.4 mg/dL — ABNORMAL LOW (ref 8.9–10.3)
Chloride: 102 mmol/L (ref 98–111)
Creatinine, Ser: 1.11 mg/dL — ABNORMAL HIGH (ref 0.44–1.00)
GFR, Estimated: 53 mL/min — ABNORMAL LOW
Glucose, Bld: 154 mg/dL — ABNORMAL HIGH (ref 70–99)
Potassium: 3.2 mmol/L — ABNORMAL LOW (ref 3.5–5.1)
Sodium: 138 mmol/L (ref 135–145)
Total Bilirubin: 0.4 mg/dL (ref 0.3–1.2)
Total Protein: 8 g/dL (ref 6.5–8.1)

## 2022-03-25 MED ORDER — ACETAMINOPHEN 325 MG PO TABS
650.0000 mg | ORAL_TABLET | Freq: Once | ORAL | Status: AC
  Administered 2022-03-25: 650 mg via ORAL
  Filled 2022-03-25: qty 2

## 2022-03-25 MED ORDER — DEXAMETHASONE 4 MG PO TABS
20.0000 mg | ORAL_TABLET | Freq: Once | ORAL | Status: AC
  Administered 2022-03-25: 20 mg via ORAL
  Filled 2022-03-25: qty 5

## 2022-03-25 MED ORDER — DARATUMUMAB-HYALURONIDASE-FIHJ 1800-30000 MG-UT/15ML ~~LOC~~ SOLN
1800.0000 mg | Freq: Once | SUBCUTANEOUS | Status: AC
  Administered 2022-03-25: 1800 mg via SUBCUTANEOUS
  Filled 2022-03-25: qty 15

## 2022-03-25 MED ORDER — DIPHENHYDRAMINE HCL 25 MG PO CAPS
50.0000 mg | ORAL_CAPSULE | Freq: Once | ORAL | Status: AC
  Administered 2022-03-25: 50 mg via ORAL
  Filled 2022-03-25: qty 2

## 2022-03-25 MED ORDER — MONTELUKAST SODIUM 10 MG PO TABS
10.0000 mg | ORAL_TABLET | Freq: Once | ORAL | Status: AC
  Administered 2022-03-25: 10 mg via ORAL
  Filled 2022-03-25: qty 1

## 2022-03-25 MED ORDER — HEPARIN SOD (PORK) LOCK FLUSH 100 UNIT/ML IV SOLN
500.0000 [IU] | Freq: Once | INTRAVENOUS | Status: AC
  Administered 2022-03-25: 500 [IU] via INTRAVENOUS
  Filled 2022-03-25: qty 5

## 2022-03-25 MED ORDER — DEXAMETHASONE 4 MG PO TABS
ORAL_TABLET | ORAL | 1 refills | Status: DC
Start: 2022-03-25 — End: 2022-04-22

## 2022-03-25 NOTE — Progress Notes (Signed)
Pt would like rx for her dry cough.

## 2022-03-25 NOTE — Patient Instructions (Signed)
[facility]  Discharge Instructions: Thank you for choosing [facility] to provide your oncology and hematology care.  If you have a lab appointment with the Cancer Center, please go directly to the Cancer Center and check in at the registration area.  Wear comfortable clothing and clothing appropriate for easy access to any Portacath or PICC line.   We strive to give you quality time with your provider. You may need to reschedule your appointment if you arrive late (15 or more minutes).  Arriving late affects you and other patients whose appointments are after yours.  Also, if you miss three or more appointments without notifying the office, you may be dismissed from the clinic at the provider's discretion.      For prescription refill requests, have your pharmacy contact our office and allow 72 hours for refills to be completed.    Today you received the following chemotherapy and/or immunotherapy agents: Darzalex Faspro      To help prevent nausea and vomiting after your treatment, we encourage you to take your nausea medication as directed.  BELOW ARE SYMPTOMS THAT SHOULD BE REPORTED IMMEDIATELY: *FEVER GREATER THAN 100.4 F (38 C) OR HIGHER *CHILLS OR SWEATING *NAUSEA AND VOMITING THAT IS NOT CONTROLLED WITH YOUR NAUSEA MEDICATION *UNUSUAL SHORTNESS OF BREATH *UNUSUAL BRUISING OR BLEEDING *URINARY PROBLEMS (pain or burning when urinating, or frequent urination) *BOWEL PROBLEMS (unusual diarrhea, constipation, pain near the anus) TENDERNESS IN MOUTH AND THROAT WITH OR WITHOUT PRESENCE OF ULCERS (sore throat, sores in mouth, or a toothache) UNUSUAL RASH, SWELLING OR PAIN  UNUSUAL VAGINAL DISCHARGE OR ITCHING   Items with * indicate a potential emergency and should be followed up as soon as possible or go to the Emergency Department if any problems should occur.  Please show the CHEMOTHERAPY ALERT CARD or IMMUNOTHERAPY ALERT CARD at  check-in to the Emergency Department and triage nurse.  Should you have questions after your visit or need to cancel or reschedule your appointment, please contact [facility]  [phone] and follow the prompts.  Office hours are 8:00 a.m. to 4:30 p.m. Monday - Friday. Please note that voicemails left after 4:00 p.m. may not be returned until the following business day.  We are closed weekends and major holidays. You have access to a nurse at all times for urgent questions. Please call the main number to the clinic [phone] and follow the prompts.  For any non-urgent questions, you may also contact your provider using MyChart. We now offer e-Visits for anyone 18 and older to request care online for non-urgent symptoms. For details visit [email or web address].   Also download the MyChart app! Go to the app store, search "MyChart", open the app, select , and log in with your MyChart username and password.  Masks are optional in the cancer centers. If you would like for your care team to wear a mask while they are taking care of you, please let them know. For doctor visits, patients may have with them one support person who is at least 70 years old. At this time, visitors are not allowed in the infusion area.   

## 2022-03-25 NOTE — Assessment & Plan Note (Addendum)
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  Currently recurrent-on salvage therapy.Patient currently on salvage therapy with daratumumab  lenalidomide [15 mg 3 w; 1-w] dexamethasone.    # Continue cycle #6-day 15 Dara SQ q2 weekly; continue REVLIMID 15 mg again. 3 weeks/1 week off; continue. Labs today reviewed;  acceptable for treatment today. JULY  2023- 0.8 gm; lamda LC= 1140-150s- overall STABLE/improving.  Recommend dexamethasone 20 mg; day prior to treatment; in clinic 20 mg with treatment. Will need weekly dex 20 mg.   # Iatrogenic hypothyroidism [ Graves/goiter s/p total thyroidectomy;aug,2022 ]-  April 2023- TSH-LOW; decrease the dose of Synthroid to 100 mcg/day- AUG 2023- TSH- WNL.STABLE.  # Muscle cramps-?  Revlimid. improved  On tramadol q 12 hours prn-STABLE.  Refill tramadol.  # Chronic diastolic CHF-clinicallySTABLE.   # Bone lesions /last zometa on 11/27/2017.  Holding Zometa secondary to poor dentition.  Awaiting dental evaluation in AUG, 2023; will need hematology clearance prior patient dental extraction.appt- OCT 2nd 2023.   # IV access: mediport   :MM panel; K/l light chain ratio q 4 W  # DISPOSITION:  #  Dara today #  Follow up in 4 weeks- MD;Dara SQ; 'port- labs-cbc/cmp;- Thryoid profile MM panel; K/l light chain.  Dr.B

## 2022-03-25 NOTE — Progress Notes (Signed)
[facility] OFFICE PROGRESS NOTE  Patient Care Team: Marguerita Merles, MD as PCP - General (Family Medicine) End, Harrell Gave, MD as PCP - Cardiology (Cardiology) [name], [name] [name], MD as Consulting Physician (Hematology and Oncology) Ottie Glazier, MD as Consulting Physician (Pulmonary Disease) Gabriel Carina Betsey Holiday, MD as Consulting Physician (Endocrinology)   Cancer Staging  No matching staging information was found for the patient.   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 No morphologic support for residual myeloma disease. Negative for minimal residual disease by MM-MRD flow cytometry; HOLD REVLIMID [leg swelling]; AUG 2021-PET scan negative for myeloma; continue to hold Revlimid  # MARCH 2021-diastolic congestive heart failure [Dr.End]  # BMBx- OCT 2021- BMBx- 5% plasmacytosis-however this appears to be more polyclonal rather than monoclonal-not explain patient worsening anemia.   # OCT 2022- 18th- Dara- Rev-Dex  #November 2021-hyperthyroidism/goiter-  [D.Bennett/Dr.Solum]-methimazole. S/p Thyroidectomy [Dr.Kim; [facility]-AUG 25909] --------------------------  # 12/12- RIGHT JUGULAR DVT-x 355mn xarelto; finished April 2020; September 2020-EGD/dysphagia; Dr. AnVicente Males# Acute renal failure [Dr.Singh; Proteinuria 1.5gm/day ]; acyclovir/Asprin ------------------------------------------------------------------------------------------------------------   DIAGNOSIS: _0  MULTIPLE MYELOMA  STAGE: III/HIGH RISK ;GOALS: CONTROL  CURRENT/MOST RECENT THERAPY-maintenance Revlimid- on HOLD    Multiple myel[place] not having achieved remission ([facility] 11/21/2017 - 04/28/2018 Chemotherapy   Patient is on Treatment Plan : MYELOMA SALVAGE Cyclophosphamide / Carfilzomib / Dexamethasone (CCd) q28d     05/12/2021 - 02/25/2022 Chemotherapy   Patient is on Treatment Plan : MYELOMA RELAPSED REFRACTORY Daratumumab SQ + Lenalidomide + Dexamethasone (DaraRd) q28d     05/12/2021 -  Chemotherapy   Patient is on Treatment Plan : MYELOMA RELAPSED REFRACTORY Daratumumab SQ + Lenalidomide + Dexamethasone (DaraRd) q28d      INTERVAL HISTORY: Ambulating independently.  Alone.  Brandi Ren51.o.  female pleasant patient above history of relapsed multiple myeloma--currently on daratumumab-Revlimid-dexamethasone  is here for follow-up.  Continues to complain of intermittent cramping/muscle spasm.  Chronic mild muscle spasms needing tramadol every 12 hours also.   Ongoing cough not any worse.  No chest pain or shortness of breath.  Review of Systems  Constitutional:  Positive for malaise/fatigue. Negative for chills, diaphoresis and fever.  HENT:  Negative for nosebleeds and sore throat.   Eyes:  Negative for double vision.  Respiratory:  Negative for hemoptysis.   Cardiovascular:  Negative for chest pain, palpitations and orthopnea.  Gastrointestinal:  Negative for abdominal pain, blood in stool, constipation, heartburn and melena.  Genitourinary:  Negative for dysuria,  frequency and urgency.  Musculoskeletal:  Positive for back pain.  Skin: Negative.  Negative for itching and rash.  Neurological:  Negative for dizziness, tingling, focal weakness, weakness and headaches.  Endo/Heme/Allergies:  Does not bruise/bleed easily.  Psychiatric/Behavioral:  Negative for depression. The patient is not nervous/anxious.     PAST MEDICAL HISTORY :  Past Medical History:  Diagnosis Date   (HFpEF) heart failure with preserved ejection fraction ([place])    a. 06/2020 Echo: EF 60-65%, no rwma, nl RV size/fxn. Triv MR. Triv TR/AI. Mod elev PASP.   Anxiety    COPD (chronic obstructive pulmonary disease) (HCC)    GERD (gastroesophageal reflux disease)    Hypertension    Hyperthyroidism    Hypokalemia    IgA myeloma (HCC)    Multiple myeloma (HCC)    Pneumonia    Red blood cell antibody positive, compatible PRBC difficult to obtain    S/P autologous bone marrow transplantation ([place])     PAST SURGICAL HISTORY :   Past Surgical History:  Procedure Laterality Date   ANTERIOR VITRECTOMY Left 10/07/2021   Procedure: ANTERIOR VITRECTOMY;  Surgeon: Leandrew Koyanagi, MD;  Location: [facility];  Service: Ophthalmology;  Laterality: Left;   CATARACT EXTRACTION W/PHACO Left 10/07/2021   Procedure: CATARACT EXTRACTION PHACO AND INTRAOCULAR LENS PLACEMENT ([place]) LEFT VISION BLUE;  Surgeon: Leandrew Koyanagi, MD;  Location: [facility];  Service: Ophthalmology;  Laterality: Left;  kit for manual incision 14.56 01:21.4   CATARACT EXTRACTION W/PHACO Right 10/21/2021   Procedure: CATARACT EXTRACTION PHACO AND INTRAOCULAR LENS PLACEMENT ([facility]) RIGHT 11.12 01:48.1;  Surgeon: Leandrew Koyanagi, MD;  Location: [facility];  Service: Ophthalmology;  Laterality: Right;   ESOPHAGOGASTRODUODENOSCOPY (EGD) WITH PROPOFOL N/A 03/30/2019   Procedure: ESOPHAGOGASTRODUODENOSCOPY (EGD) WITH PROPOFOL;  Surgeon: Jonathon Bellows, MD;  Location: [facility];  Service:  Gastroenterology;  Laterality: N/A;   IR FLUORO GUIDE PORT INSERTION RIGHT  12/16/2017   IR IMAGING GUIDED PORT INSERTION  06/15/2021   THYROIDECTOMY N/A     FAMILY HISTORY :   Family History  Problem Relation Age of Onset   Pneumonia Mother    Seizures Father     SOCIAL HISTORY:   Social History   Tobacco Use   Smoking status: Former    Packs/day: 0.25    Types: Cigarettes    Quit date: 03/18/2021    Years since quitting: 1.0   Smokeless tobacco: Never   Tobacco comments:    2 cigarettes QOD  Vaping Use   Vaping Use: Never used  Substance Use Topics   Alcohol use: No   Drug use: No    ALLERGIES:  has No Known Allergies.  MEDICATIONS:  Current Outpatient Medications  Medication Sig Dispense Refill   acyclovir (ZOVIRAX) 400 MG tablet Take 1 tablet (400 mg total) by mouth 2 (two) times daily. 60 tablet 3   albuterol (VENTOLIN HFA) 108 (90 Base) MCG/ACT inhaler Inhale 2 puffs into the lungs every 6 (six) hours as needed for wheezing or shortness of breath. 8 g 3   aspirin EC 81 MG EC tablet Take 1 tablet (81 mg total) by mouth daily. 90 tablet 3   buPROPion (WELLBUTRIN) 75 MG tablet Take 75 mg by mouth daily.     calcium-vitamin D (OSCAL WITH D) 500MG-200UNIT (5MCG) tablet Take 1 tablet by mouth 2 (two) times daily. 60 tablet 0   carvedilol (COREG) 3.125 MG tablet Take 1 tablet (3.125 mg total) by mouth 2 (two) times daily with a meal. 60 tablet 5   dexamethasone (DECADRON) 4 MG tablet Take 5 pills 1 day prior to chemo.  Take in the mornings 60 tablet 1   Ensure Plus (ENSURE PLUS) LIQD Take 237 mLs by mouth 3 (  three) times a week.     furosemide (LASIX) 40 MG tablet Take 1 tablet (40 mg total) by mouth daily. 90 tablet 3   lenalidomide (REVLIMID) 15 MG capsule Take 1 capsule (15 mg total) by mouth daily. Take for 21 days, then hold for 7 days. Repeat every 28 days. 21 capsule 0   levothyroxine (SYNTHROID) 100 MCG tablet Take 1 tablet (100 mcg total) by mouth daily before  breakfast. 30 tablet 3   lidocaine-prilocaine (EMLA) cream Apply 1 application topically as needed. Apply to port and cover with saran wrap 1-2 hours prior to port access 30 g 1   loperamide (IMODIUM) 2 MG capsule Take 2 mg by mouth as needed for diarrhea or loose stools.     magnesium oxide (MAG-OX) 400 (240 Mg) MG tablet Take 1 tablet (400 mg total) by mouth 2 (two) times daily. 60 tablet 3   montelukast (SINGULAIR) 10 MG tablet Take 1 tablet (10 mg total) by mouth at bedtime. 30 tablet 3   ondansetron (ZOFRAN) 8 MG tablet One pill every 8 hours as needed for nausea/vomitting. 40 tablet 1   pantoprazole (PROTONIX) 40 MG tablet Take 1 tablet (40 mg total) by mouth daily. 90 tablet 0   polyethylene glycol (MIRALAX / GLYCOLAX) packet Take 17 g by mouth daily as needed for mild constipation. 14 each 0   potassium chloride SA (KLOR-CON M) 20 MEQ tablet Take 1 tablet (20 mEq total) by mouth 2 (two) times daily. 60 tablet 6   spironolactone (ALDACTONE) 25 MG tablet Take 1 tablet by mouth once daily 90 tablet 0   traMADol (ULTRAM) 50 MG tablet Take 1 tablet (50 mg total) by mouth every 12 (twelve) hours as needed. 60 tablet 0   Vitamin D, Ergocalciferol, (DRISDOL) 1.25 MG (50000 UNIT) CAPS capsule Take 1 capsule by mouth once a week 12 capsule 0   No current facility-administered medications for this visit.   Facility-Administered Medications Ordered in Other Visits  Medication Dose Route Frequency Provider Last Rate Last Admin   0.9 %  sodium chloride infusion   Intravenous Continuous Monia Sabal, PA-C        PHYSICAL EXAMINATION: ECOG PERFORMANCE STATUS: 1 - Symptomatic but completely ambulatory  BP 134/80 (BP Location: Left Arm, Patient Position: Sitting, Cuff Size: Normal)   Pulse (!) 59   Temp (!) 96.9 F (36.1 C) (Tympanic)   Ht _0  (1.575 m)   Wt 178 lb 3.2 oz (80.8 kg)   SpO2 98%   BMI 32.59 kg/m   Filed Weights   03/25/22 1005  Weight: 178 lb 3.2 oz (80.8 kg)      Physical Exam HENT:     Head: Normocephalic and atraumatic.  Eyes:     Pupils: Pupils are equal, round, and reactive to light.  Cardiovascular:     Rate and Rhythm: Normal rate and regular rhythm.  Pulmonary:     Effort: Pulmonary effort is normal. No respiratory distress.     Breath sounds: Normal breath sounds. No wheezing.  Abdominal:     General: Bowel sounds are normal. There is no distension.     Palpations: Abdomen is soft. There is no mass.     Tenderness: There is no abdominal tenderness. There is no guarding or rebound.  Musculoskeletal:        General: No tenderness. Normal range of motion.     Cervical back: Normal range of motion and neck supple.  Skin:    General: Skin is  warm.  Neurological:     Mental Status: She is alert and oriented to person, place, and time.  Psychiatric:        Mood and Affect: Affect normal.    LABORATORY DATA:  I have reviewed the data as listed    Component Value Date/Time   NA 138 03/25/2022 0909   NA 141 01/25/2020 1530   K 3.2 (L) 03/25/2022 0909   CL 102 03/25/2022 0909   CO2 21 (L) 03/25/2022 0909   GLUCOSE 154 (H) 03/25/2022 0909   BUN 11 03/25/2022 0909   BUN 19 01/25/2020 1530   CREATININE 1.11 (H) 03/25/2022 0909   CALCIUM 8.4 (L) 03/25/2022 0909   PROT 8.0 03/25/2022 0909   ALBUMIN 3.6 03/25/2022 0909   AST 47 (H) 03/25/2022 0909   ALT 25 03/25/2022 0909   ALKPHOS 81 03/25/2022 0909   BILITOT 0.4 03/25/2022 0909   GFRNONAA 53 (L) 03/25/2022 0909   GFRAA NOT CALCULATED 04/17/2020 1352    No results found for: "SPEP", "UPEP"  Lab Results  Component Value Date   WBC 4.5 03/25/2022   NEUTROABS 3.2 03/25/2022   HGB 9.9 (L) 03/25/2022   HCT 30.9 (L) 03/25/2022   MCV 93.4 03/25/2022   PLT 262 03/25/2022      Chemistry      Component Value Date/Time   NA 138 03/25/2022 0909   NA 141 01/25/2020 1530   K 3.2 (L) 03/25/2022 0909   CL 102 03/25/2022 0909   CO2 21 (L) 03/25/2022 0909   BUN 11 03/25/2022  0909   BUN 19 01/25/2020 1530   CREATININE 1.11 (H) 03/25/2022 0909      Component Value Date/Time   CALCIUM 8.4 (L) 03/25/2022 0909   ALKPHOS 81 03/25/2022 0909   AST 47 (H) 03/25/2022 0909   ALT 25 03/25/2022 0909   BILITOT 0.4 03/25/2022 0909      (H): Data is abnormally high   Latest Reference Range & Units Most Recent 12/24/20 14:01 04/01/21 14:34 04/28/21 09:17 06/11/21 09:42 07/03/21 10:32 08/20/21 10:06  M Protein SerPl Elph-Mcnc Not Observed g/dL 1.1 (H) (C) 08/20/21 10:06 Not Observed (C) 0.6 (H) (C) 0.7 (H) (C) 0.5 (H) (C) 0.7 (H) (C) 1.1 (H) (C)    Latest Reference Range & Units Most Recent 09/17/21 08:47 10/15/21 08:25 11/12/21 08:55  Kappa free light chain 3.3 - 19.4 mg/L 5.0 11/12/21 08:55 7.2 7.2 5.0  Lambda free light chains 5.7 - 26.3 mg/L 223.6 (H) 11/12/21 08:55 215.3 (H) 340.2 (H) 223.6 (H)  Kappa, lambda light chain ratio 0.26 - 1.65  0.02 (L) 11/12/21 08:55 0.03 (L) 0.02 (L) 0.02 (L)  (H): Data is abnormally high (L): Data is abnormally low  RADIOGRAPHIC STUDIES: I have personally reviewed the radiological images as listed and agreed with the findings in the report. No results found.   ASSESSMENT & PLAN:  Multiple myeloma not having achieved remission ([place]) # RECURRENT Multiple myeloma stage III [high-risk cytogenetics-status post KRD-VGPR ; status post autologous stem cell transplant on 06/15/18.  Currently recurrent-on salvage therapy. STABLE.   # Patient currently on salvage therapy with daratumumab  lenalidomide [15 mg 3 w; 1-w] dexamethasone.   # Continue cycle #6-day 15 Dara SQ weekly; continue REVLIMID 15 mg again. 3 weeks/1 week off; continue. Labs today reviewed;  acceptable for treatment today. JULY  2023- 0.8 gm; lamda LC= 1140-150s- overall STABLE/improving.  Recommend dexamethasone 20 mg; day prior to treatment; in clinic 20 mg with treatment.  # Iatrogenic  hypothyroidism [ Graves/goiter s/p total thyroidectomy;aug,2022 ]-  April 2023- TSH-LOW;  decrease the dose of Synthroid to 100 mcg/day- AUG 2023- TSH- WNL.STABLE.  # Muscle cramps-?  Revlimid. improved  On tramadol q 12 hours prn-STABLE.  Refill tramadol.  # Chronic diastolic CHF-clinicallySTABLE.   # Bone lesions /last zometa on 11/27/2017.  Holding Zometa secondary to poor dentition.  Awaiting dental evaluation in AUG, 2023; will need hematology clearance prior patient dental extraction.appt- OCT 2nd 2023.   # IV access: mediport   :MM panel; K/l light chain ratio q 4 W  # DISPOSITION:  #  Dara today #  Follow up in 4 weeks- MD;Dara SQ; 'port- labs-cbc/cmp;- Thryoid profile MM panel; K/l light chain.  Dr.B     Orders Placed This Encounter  Procedures   Comprehensive metabolic panel    Standing Status:   Future    Standing Expiration Date:   04/23/2023   CBC with Differential    Standing Status:   Future    Standing Expiration Date:   04/23/2023   Comprehensive metabolic panel    Standing Status:   Future    Standing Expiration Date:   04/22/2023   Thyroid Panel With TSH    Standing Status:   Future    Standing Expiration Date:   03/26/2023   Multiple Myeloma Panel (SPEP&IFE w/QIG)    Standing Status:   Future    Standing Expiration Date:   03/26/2023   Kappa/lambda light chains    Standing Status:   Future    Standing Expiration Date:   03/26/2023     All questions were answered. The patient knows to call the clinic with any problems, questions or concerns.      [name], MD 03/25/2022 12:56 PM

## 2022-03-26 ENCOUNTER — Other Ambulatory Visit: Payer: Self-pay

## 2022-03-26 LAB — KAPPA/LAMBDA LIGHT CHAINS
Kappa free light chain: 3.5 mg/L (ref 3.3–19.4)
Kappa, lambda light chain ratio: 0.02 — ABNORMAL LOW (ref 0.26–1.65)
Lambda free light chains: 183.6 mg/L — ABNORMAL HIGH (ref 5.7–26.3)

## 2022-03-30 LAB — MULTIPLE MYELOMA PANEL, SERUM
Albumin SerPl Elph-Mcnc: 3.6 g/dL (ref 2.9–4.4)
Albumin/Glob SerPl: 1 (ref 0.7–1.7)
Alpha 1: 0.2 g/dL (ref 0.0–0.4)
Alpha2 Glob SerPl Elph-Mcnc: 0.9 g/dL (ref 0.4–1.0)
B-Globulin SerPl Elph-Mcnc: 1.5 g/dL — ABNORMAL HIGH (ref 0.7–1.3)
Gamma Glob SerPl Elph-Mcnc: 1.1 g/dL (ref 0.4–1.8)
Globulin, Total: 3.7 g/dL (ref 2.2–3.9)
IgA: 764 mg/dL — ABNORMAL HIGH (ref 87–352)
IgG (Immunoglobin G), Serum: 413 mg/dL — ABNORMAL LOW (ref 586–1602)
IgM (Immunoglobulin M), Srm: 6 mg/dL — ABNORMAL LOW (ref 26–217)
M Protein SerPl Elph-Mcnc: 0.7 g/dL — ABNORMAL HIGH
Total Protein ELP: 7.3 g/dL (ref 6.0–8.5)

## 2022-04-01 ENCOUNTER — Other Ambulatory Visit: Payer: Self-pay

## 2022-04-08 ENCOUNTER — Other Ambulatory Visit: Payer: Self-pay

## 2022-04-08 DIAGNOSIS — C9002 Multiple myeloma in relapse: Secondary | ICD-10-CM

## 2022-04-09 ENCOUNTER — Encounter: Payer: Self-pay | Admitting: Internal Medicine

## 2022-04-09 MED ORDER — LENALIDOMIDE 15 MG PO CAPS: 15.0000 mg | ORAL_CAPSULE | Freq: Every day | ORAL | 0 refills | Status: DC

## 2022-04-12 MED ORDER — LENALIDOMIDE 15 MG PO CAPS: 15.0000 mg | ORAL_CAPSULE | Freq: Every day | ORAL | 0 refills | Status: DC

## 2022-04-12 NOTE — Addendum Note (Signed)
Addended by: Vanice Sarah on: 04/12/2022 10:25 AM   Modules accepted: Orders

## 2022-04-12 NOTE — Telephone Encounter (Signed)
Rx was sent to local [facility].  Resent to [facility] on 04/12/22

## 2022-04-13 ENCOUNTER — Other Ambulatory Visit: Payer: Self-pay | Admitting: Medical

## 2022-04-15 ENCOUNTER — Other Ambulatory Visit: Payer: Self-pay

## 2022-04-20 ENCOUNTER — Other Ambulatory Visit: Payer: Self-pay | Admitting: Internal Medicine

## 2022-04-22 ENCOUNTER — Inpatient Hospital Stay: Payer: Medicare Other | Attending: Nurse Practitioner | Admitting: Internal Medicine

## 2022-04-22 ENCOUNTER — Inpatient Hospital Stay: Payer: Medicare Other

## 2022-04-22 ENCOUNTER — Encounter: Payer: Self-pay | Admitting: Internal Medicine

## 2022-04-22 VITALS — BP 145/67 | HR 55 | Temp 98.6°F | Resp 18 | Ht 62.0 in | Wt 176.4 lb

## 2022-04-22 DIAGNOSIS — C9002 Multiple myeloma in relapse: Secondary | ICD-10-CM | POA: Diagnosis not present

## 2022-04-22 DIAGNOSIS — Z79899 Other long term (current) drug therapy: Secondary | ICD-10-CM | POA: Insufficient documentation

## 2022-04-22 DIAGNOSIS — Z5112 Encounter for antineoplastic immunotherapy: Secondary | ICD-10-CM | POA: Diagnosis not present

## 2022-04-22 DIAGNOSIS — C9000 Multiple myeloma not having achieved remission: Secondary | ICD-10-CM

## 2022-04-22 LAB — CBC WITH DIFFERENTIAL/PLATELET
Abs Immature Granulocytes: 0.04 10*3/uL (ref 0.00–0.07)
Basophils Absolute: 0 10*3/uL (ref 0.0–0.1)
Basophils Relative: 0 %
Eosinophils Absolute: 0 10*3/uL (ref 0.0–0.5)
Eosinophils Relative: 0 %
HCT: 30 % — ABNORMAL LOW (ref 36.0–46.0)
Hemoglobin: 9.9 g/dL — ABNORMAL LOW (ref 12.0–15.0)
Immature Granulocytes: 1 %
Lymphocytes Relative: 22 %
Lymphs Abs: 1.2 10*3/uL (ref 0.7–4.0)
MCH: 30.1 pg (ref 26.0–34.0)
MCHC: 33 g/dL (ref 30.0–36.0)
MCV: 91.2 fL (ref 80.0–100.0)
Monocytes Absolute: 0.2 10*3/uL (ref 0.1–1.0)
Monocytes Relative: 4 %
Neutro Abs: 4.3 10*3/uL (ref 1.7–7.7)
Neutrophils Relative %: 73 %
Platelets: 224 10*3/uL (ref 150–400)
RBC: 3.29 MIL/uL — ABNORMAL LOW (ref 3.87–5.11)
RDW: 16.2 % — ABNORMAL HIGH (ref 11.5–15.5)
WBC: 5.8 10*3/uL (ref 4.0–10.5)
nRBC: 0 % (ref 0.0–0.2)

## 2022-04-22 LAB — COMPREHENSIVE METABOLIC PANEL
ALT: 11 U/L (ref 0–44)
AST: 26 U/L (ref 15–41)
Albumin: 4 g/dL (ref 3.5–5.0)
Alkaline Phosphatase: 69 U/L (ref 38–126)
Anion gap: 10 (ref 5–15)
BUN: 17 mg/dL (ref 8–23)
CO2: 22 mmol/L (ref 22–32)
Calcium: 9.2 mg/dL (ref 8.9–10.3)
Chloride: 106 mmol/L (ref 98–111)
Creatinine, Ser: 0.99 mg/dL (ref 0.44–1.00)
GFR, Estimated: >60 mL/min (ref >60)
Glucose, Bld: 141 mg/dL — ABNORMAL HIGH (ref 70–99)
Potassium: 3.5 mmol/L (ref 3.5–5.1)
Sodium: 138 mmol/L (ref 135–145)
Total Bilirubin: 0.6 mg/dL (ref 0.3–1.2)
Total Protein: 8.5 g/dL — ABNORMAL HIGH (ref 6.5–8.1)

## 2022-04-22 MED ORDER — DEXAMETHASONE 4 MG PO TABS
20.0000 mg | ORAL_TABLET | Freq: Once | ORAL | Status: AC
  Administered 2022-04-22: 20 mg via ORAL
  Filled 2022-04-22: qty 5

## 2022-04-22 MED ORDER — ACETAMINOPHEN 325 MG PO TABS
650.0000 mg | ORAL_TABLET | Freq: Once | ORAL | Status: AC
  Administered 2022-04-22: 650 mg via ORAL
  Filled 2022-04-22: qty 2

## 2022-04-22 MED ORDER — MONTELUKAST SODIUM 10 MG PO TABS
10.0000 mg | ORAL_TABLET | Freq: Once | ORAL | Status: AC
  Administered 2022-04-22: 10 mg via ORAL
  Filled 2022-04-22: qty 1

## 2022-04-22 MED ORDER — HEPARIN SOD (PORK) LOCK FLUSH 100 UNIT/ML IV SOLN
500.0000 [IU] | Freq: Once | INTRAVENOUS | Status: AC
  Administered 2022-04-22: 500 [IU] via INTRAVENOUS
  Filled 2022-04-22: qty 5

## 2022-04-22 MED ORDER — DEXAMETHASONE 4 MG PO TABS: ORAL_TABLET | ORAL | 4 refills | Status: DC

## 2022-04-22 MED ORDER — DARATUMUMAB-HYALURONIDASE-FIHJ 1800-30000 MG-UT/15ML ~~LOC~~ SOLN
1800.0000 mg | Freq: Once | SUBCUTANEOUS | Status: AC
  Administered 2022-04-22: 1800 mg via SUBCUTANEOUS
  Filled 2022-04-22: qty 15

## 2022-04-22 MED ORDER — MONTELUKAST SODIUM 10 MG PO TABS: 10.0000 mg | ORAL_TABLET | Freq: Every day | ORAL | 3 refills | Status: DC

## 2022-04-22 MED ORDER — DIPHENHYDRAMINE HCL 25 MG PO CAPS
50.0000 mg | ORAL_CAPSULE | Freq: Once | ORAL | Status: AC
  Administered 2022-04-22: 50 mg via ORAL
  Filled 2022-04-22: qty 2

## 2022-04-22 NOTE — Patient Instructions (Signed)
[facility]  Discharge Instructions: Thank you for choosing [facility] to provide your oncology and hematology care.  If you have a lab appointment with the Cancer Center, please go directly to the Cancer Center and check in at the registration area.  Wear comfortable clothing and clothing appropriate for easy access to any Portacath or PICC line.   We strive to give you quality time with your provider. You may need to reschedule your appointment if you arrive late (15 or more minutes).  Arriving late affects you and other patients whose appointments are after yours.  Also, if you miss three or more appointments without notifying the office, you may be dismissed from the clinic at the provider's discretion.      For prescription refill requests, have your pharmacy contact our office and allow 72 hours for refills to be completed.    Today you received the following chemotherapy and/or immunotherapy agents Darzalex      To help prevent nausea and vomiting after your treatment, we encourage you to take your nausea medication as directed.  BELOW ARE SYMPTOMS THAT SHOULD BE REPORTED IMMEDIATELY: *FEVER GREATER THAN 100.4 F (38 C) OR HIGHER *CHILLS OR SWEATING *NAUSEA AND VOMITING THAT IS NOT CONTROLLED WITH YOUR NAUSEA MEDICATION *UNUSUAL SHORTNESS OF BREATH *UNUSUAL BRUISING OR BLEEDING *URINARY PROBLEMS (pain or burning when urinating, or frequent urination) *BOWEL PROBLEMS (unusual diarrhea, constipation, pain near the anus) TENDERNESS IN MOUTH AND THROAT WITH OR WITHOUT PRESENCE OF ULCERS (sore throat, sores in mouth, or a toothache) UNUSUAL RASH, SWELLING OR PAIN  UNUSUAL VAGINAL DISCHARGE OR ITCHING   Items with * indicate a potential emergency and should be followed up as soon as possible or go to the Emergency Department if any problems should occur.  Please show the CHEMOTHERAPY ALERT CARD or IMMUNOTHERAPY ALERT CARD at check-in to  the Emergency Department and triage nurse.  Should you have questions after your visit or need to cancel or reschedule your appointment, please contact [facility]  [phone] and follow the prompts.  Office hours are 8:00 a.m. to 4:30 p.m. Monday - Friday. Please note that voicemails left after 4:00 p.m. may not be returned until the following business day.  We are closed weekends and major holidays. You have access to a nurse at all times for urgent questions. Please call the main number to the clinic [phone] and follow the prompts.  For any non-urgent questions, you may also contact your provider using MyChart. We now offer e-Visits for anyone 18 and older to request care online for non-urgent symptoms. For details visit [email or web address].   Also download the MyChart app! Go to the app store, search "MyChart", open the app, select [facility], and log in with your MyChart username and password.  Masks are optional in the cancer centers. If you would like for your care team to wear a mask while they are taking care of you, please let them know. For doctor visits, patients may have with them one support person who is at least 70 years old. At this time, visitors are not allowed in the infusion area.   

## 2022-04-22 NOTE — Progress Notes (Signed)
Low back pain/discomfort with standing not with sitting or walking.  After standing for a few minutes the pain will occur and has to sit down to relieve pain.  After sitting a few seconds the pain goes away.    Occasional right foot "feels heavy" doesn't affect gait.  New right hip pain when standing from sitting.  Refill for singular pended for MD review.

## 2022-04-22 NOTE — Progress Notes (Signed)
[place] OFFICE PROGRESS NOTE  Patient Care Team: Marguerita Merles, MD as PCP - General (Family Medicine) End, Harrell Gave, MD as PCP - Cardiology (Cardiology) [name], [name] [name], MD as Consulting Physician (Hematology and Oncology) Ottie Glazier, MD as Consulting Physician (Pulmonary Disease) Gabriel Carina Betsey Holiday, MD as Consulting Physician (Endocrinology)   Cancer Staging  No matching staging information was found for the patient.   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 No morphologic support for residual myeloma disease. Negative for minimal residual disease by MM-MRD flow cytometry; HOLD REVLIMID [leg swelling]; AUG 2021-PET scan negative for myeloma; continue to hold Revlimid  # MARCH 2021-diastolic congestive heart failure [Dr.End]  # BMBx- OCT 2021- BMBx- 5% plasmacytosis-however this appears to be more polyclonal rather than monoclonal-not explain patient worsening anemia.   # OCT 2022- 18th- Dara- Rev-Dex  #November 2021-hyperthyroidism/goiter-  [D.Bennett/Dr.Solum]-methimazole. S/p Thyroidectomy [Dr.Kim; [facility] 20981] --------------------------  # 12/12- RIGHT JUGULAR DVT-x 348mn xarelto; finished April 2020; September 2020-EGD/dysphagia; Dr. AnVicente Males# Acute renal failure [Dr.Singh; Proteinuria 1.5gm/day ]; acyclovir/Asprin ------------------------------------------------------------------------------------------------------------   DIAGNOSIS: _0  MULTIPLE MYELOMA  STAGE: III/HIGH RISK ;GOALS: CONTROL  CURRENT/MOST RECENT THERAPY-maintenance Revlimid- on HOLD    [place]21[place]2017 - 04/28/2018 Chemotherapy   Patient is on Treatment Plan : MYELOMA SALVAGE Cyclophosphamide / Carfilzomib / Dexamethasone (CCd) q28d     05/12/2021 - 02/25/2022 Chemotherapy   Patient is on Treatment Plan : MYELOMA RELAPSED REFRACTORY Daratumumab SQ + Lenalidomide + Dexamethasone (DaraRd) q28d     05/12/2021 -  Chemotherapy   Patient is on Treatment Plan : MYELOMA RELAPSED REFRACTORY Daratumumab SQ + Lenalidomide + Dexamethasone (DaraRd) q28d      INTERVAL HISTORY: Ambulating independently.  Alone.  Brandi Ren44.[id number].  female pleasant patient above history of relapsed multiple myeloma--currently on daratumumab-Revlimid-dexamethasone  is here for follow-up.  Continues to complain of intermittent cramping/muscle spasm.  Chronic mild muscle spasms needing tramadol every 12 hours also.   Denies any cough.  No chest pain or shortness of breath.  Review of Systems  Constitutional:  Positive for malaise/fatigue. Negative for chills, diaphoresis and fever.  HENT:  Negative for nosebleeds and sore throat.   Eyes:  Negative for double vision.  Respiratory:  Negative for hemoptysis.   Cardiovascular:  Negative for chest pain, palpitations and orthopnea.  Gastrointestinal:  Negative for abdominal pain, blood in stool, constipation, heartburn and melena.  Genitourinary:  Negative for dysuria, frequency  and urgency.  Musculoskeletal:  Positive for back pain.  Skin: Negative.  Negative for itching and rash.  Neurological:  Negative for dizziness, tingling, focal weakness, weakness and headaches.  Endo/Heme/Allergies:  Does not bruise/bleed easily.  Psychiatric/Behavioral:  Negative for depression. The patient is not nervous/anxious.     PAST MEDICAL HISTORY :  Past Medical History:  Diagnosis Date   (HFpEF) heart failure with preserved ejection fraction ([place])    a. 06/2020 Echo: EF 60-65%, no rwma, nl RV size/fxn. Triv MR. Triv TR/AI. Mod elev PASP.   Anxiety    COPD (chronic obstructive pulmonary disease) (HCC)    GERD (gastroesophageal reflux disease)    Hypertension    Hyperthyroidism    Hypokalemia    IgA myeloma (HCC)    Multiple myeloma (HCC)    Pneumonia    Red blood cell antibody positive, compatible PRBC difficult to obtain    S/P autologous bone marrow transplantation ([place])     PAST SURGICAL HISTORY :   Past Surgical History:  Procedure Laterality Date   ANTERIOR VITRECTOMY Left 10/07/2021   Procedure: ANTERIOR VITRECTOMY;  Surgeon: Leandrew Koyanagi, MD;  Location: [facility];  Service: Ophthalmology;  Laterality: Left;   CATARACT EXTRACTION W/PHACO Left 10/07/2021   Procedure: CATARACT EXTRACTION PHACO AND INTRAOCULAR LENS PLACEMENT ([place]) LEFT VISION BLUE;  Surgeon: Leandrew Koyanagi, MD;  Location: [facility];  Service: Ophthalmology;  Laterality: Left;  kit for manual incision 14.56 01:21.4   CATARACT EXTRACTION W/PHACO Right 10/21/2021   Procedure: CATARACT EXTRACTION PHACO AND INTRAOCULAR LENS PLACEMENT ([facility]) RIGHT 11.12 01:48.1;  Surgeon: Leandrew Koyanagi, MD;  Location: [facility];  Service: Ophthalmology;  Laterality: Right;   ESOPHAGOGASTRODUODENOSCOPY (EGD) WITH PROPOFOL N/A 03/30/2019   Procedure: ESOPHAGOGASTRODUODENOSCOPY (EGD) WITH PROPOFOL;  Surgeon: Jonathon Bellows, MD;  Location: [facility] ENDOSCOPY;  Service:  Gastroenterology;  Laterality: N/A;   IR FLUORO GUIDE PORT INSERTION RIGHT  12/16/2017   IR IMAGING GUIDED PORT INSERTION  06/15/2021   THYROIDECTOMY N/A     FAMILY HISTORY :   Family History  Problem Relation Age of Onset   Pneumonia Mother    Seizures Father     SOCIAL HISTORY:   Social History   Tobacco Use   Smoking status: Former    Packs/day: 0.25    Types: Cigarettes    Quit date: 03/18/2021    Years since quitting: 1.0   Smokeless tobacco: Never   Tobacco comments:    2 cigarettes QOD  Vaping Use   Vaping Use: Never used  Substance Use Topics   Alcohol use: No   Drug use: No    ALLERGIES:  has No Known Allergies.  MEDICATIONS:  Current Outpatient Medications  Medication Sig Dispense Refill   acyclovir (ZOVIRAX) 400 MG tablet Take 1 tablet (400 mg total) by mouth 2 (two) times daily. 60 tablet 3   albuterol (VENTOLIN HFA) 108 (90 Base) MCG/ACT inhaler Inhale 2 puffs into the lungs every 6 (six) hours as needed for wheezing or shortness of breath. 8 g 3   aspirin EC 81 MG EC tablet Take 1 tablet (81 mg total) by mouth daily. 90 tablet 3   buPROPion (WELLBUTRIN) 75 MG tablet Take 75 mg by mouth daily.     calcium-vitamin D (OSCAL WITH D) 500MG-200UNIT (5MCG) tablet Take 1 tablet by mouth 2 (two) times daily. 60 tablet 0   carvedilol (COREG) 3.125 MG tablet Take 1 tablet (3.125 mg total) by mouth 2 (two) times daily with a meal. 60 tablet 5   Ensure Plus (ENSURE PLUS) LIQD Take 237 mLs by mouth 3 (three) times a week.     furosemide (LASIX) 40 MG tablet Take 1 tablet (40 mg total) by mouth daily. 90 tablet 3  lenalidomide (REVLIMID) 15 MG capsule Take 1 capsule (15 mg total) by mouth daily. Take for 21 days, then hold for 7 days. Repeat every 28 days. 21 capsule 0   levothyroxine (SYNTHROID) 100 MCG tablet Take 1 tablet (100 mcg total) by mouth daily before breakfast. 30 tablet 3   lidocaine-prilocaine (EMLA) cream Apply 1 application topically as needed. Apply to  port and cover with saran wrap 1-2 hours prior to port access 30 g 1   loperamide (IMODIUM) 2 MG capsule Take 2 mg by mouth as needed for diarrhea or loose stools.     magnesium oxide (MAG-OX) 400 (240 Mg) MG tablet Take 1 tablet (400 mg total) by mouth 2 (two) times daily. 60 tablet 3   ondansetron (ZOFRAN) 8 MG tablet One pill every 8 hours as needed for nausea/vomitting. 40 tablet 1   pantoprazole (PROTONIX) 40 MG tablet Take 1 tablet (40 mg total) by mouth daily. 90 tablet 0   polyethylene glycol (MIRALAX / GLYCOLAX) packet Take 17 g by mouth daily as needed for mild constipation. 14 each 0   potassium chloride SA (KLOR-CON M) 20 MEQ tablet Take 1 tablet (20 mEq total) by mouth 2 (two) times daily. 60 tablet 6   spironolactone (ALDACTONE) 25 MG tablet Take 1 tablet by mouth once daily 90 tablet 3   traMADol (ULTRAM) 50 MG tablet Take 1 tablet (50 mg total) by mouth every 12 (twelve) hours as needed. 60 tablet 0   Vitamin D, Ergocalciferol, (DRISDOL) 1.25 MG (50000 UNIT) CAPS capsule Take 1 capsule by mouth once a week 12 capsule 0   dexamethasone (DECADRON) 4 MG tablet Take dexamethasone 20 mg [5 pills] weekly on Thursdays. # Do not take dexamethasone in the week of chemotherapy. Take in the mornings 60 tablet 4   montelukast (SINGULAIR) 10 MG tablet Take 1 tablet (10 mg total) by mouth at bedtime. 30 tablet 3   No current facility-administered medications for this visit.   Facility-Administered Medications Ordered in Other Visits  Medication Dose Route Frequency Provider Last Rate Last Admin   0.9 %  sodium chloride infusion   Intravenous Continuous Monia Sabal, PA-C       acetaminophen (TYLENOL) tablet 650 mg  650 mg Oral Once [name], MD       daratumumab-hyaluronidase-fihj (DARZALEX FASPRO) 1800-30000 MG-UT/15ML chemo SQ injection 1,800 mg  1,800 mg Subcutaneous Once [name], MD       dexamethasone (DECADRON) tablet 20 mg  20 mg Oral Once [name], MD       diphenhydrAMINE (BENADRYL) capsule 50 mg  50 mg Oral Once [name], MD       montelukast (SINGULAIR) tablet 10 mg  10 mg Oral Once [name], MD        PHYSICAL EXAMINATION: ECOG PERFORMANCE STATUS: 1 - Symptomatic but completely ambulatory  BP (!) 145/67 (BP Location: Right Arm, Patient Position: Sitting)   Pulse (!) 55   Temp 98.6 F (37 C) (Tympanic)   Resp 18   Ht _0  (1.575 m)   Wt 176 lb 6.4 oz (80 kg)   SpO2 98%   BMI 32.26 kg/m   Filed Weights   04/22/22 0900  Weight: 176 lb 6.4 oz (80 kg)     Physical Exam HENT:     Head: Normocephalic and atraumatic.  Eyes:     Pupils: Pupils are equal, round, and reactive to light.  Cardiovascular:     Rate  and Rhythm: Normal rate and regular rhythm.  Pulmonary:     Effort: Pulmonary effort is normal. No respiratory distress.     Breath sounds: Normal breath sounds. No wheezing.  Abdominal:     General: Bowel sounds are normal. There is no distension.     Palpations: Abdomen is soft. There is no mass.     Tenderness: There is no abdominal tenderness. There is no guarding or rebound.  Musculoskeletal:        General: No tenderness. Normal range of motion.     Cervical back: Normal range of motion and neck supple.  Skin:    General: Skin is warm.  Neurological:     Mental Status: She is alert and oriented to person, place, and time.  Psychiatric:        Mood and Affect: Affect normal.    LABORATORY DATA:  I have reviewed the data as listed    Component Value Date/Time   NA 138 04/22/2022 0923   NA 141 01/25/2020 1530   K 3.5 04/22/2022 0923   CL 106 04/22/2022 0923   CO2 22 04/22/2022 0923   GLUCOSE 141 (H) 04/22/2022 0923   BUN 17 04/22/2022 0923   BUN 19 01/25/2020 1530   CREATININE 0.99 04/22/2022 0923   CALCIUM 9.2 04/22/2022 0923   PROT 8.5 (H) 04/22/2022 0923   ALBUMIN 4.0 04/22/2022 0923   AST 26 04/22/2022 0923   ALT 11 04/22/2022 0923   ALKPHOS 69 04/22/2022  0923   BILITOT 0.6 04/22/2022 0923   GFRNONAA >60 04/22/2022 0923   GFRAA NOT CALCULATED 04/17/2020 1352    No results found for: "SPEP", "UPEP"  Lab Results  Component Value Date   WBC 5.8 04/22/2022   NEUTROABS 4.3 04/22/2022   HGB 9.9 (L) 04/22/2022   HCT 30.0 (L) 04/22/2022   MCV 91.2 04/22/2022   PLT 224 04/22/2022      Chemistry      Component Value Date/Time   NA 138 04/22/2022 0923   NA 141 01/25/2020 1530   K 3.5 04/22/2022 0923   CL 106 04/22/2022 0923   CO2 22 04/22/2022 0923   BUN 17 04/22/2022 0923   BUN 19 01/25/2020 1530   CREATININE 0.99 04/22/2022 0923      Component Value Date/Time   CALCIUM 9.2 04/22/2022 0923   ALKPHOS 69 04/22/2022 0923   AST 26 04/22/2022 0923   ALT 11 04/22/2022 0923   BILITOT 0.6 04/22/2022 0923      (H): Data is abnormally high   Latest Reference Range & Units Most Recent 12/24/20 14:01 04/01/21 14:34 04/28/21 09:17 06/11/21 09:42 07/03/21 10:32 08/20/21 10:06  M Protein SerPl Elph-Mcnc Not Observed g/dL 1.1 (H) (C) 08/20/21 10:06 Not Observed (C) 0.6 (H) (C) 0.7 (H) (C) 0.5 (H) (C) 0.7 (H) (C) 1.1 (H) (C)    Latest Reference Range & Units Most Recent 09/17/21 08:47 10/15/21 08:25 11/12/21 08:55  Kappa free light chain 3.3 - 19.4 mg/L 5.0 11/12/21 08:55 7.2 7.2 5.0  Lambda free light chains 5.7 - 26.3 mg/L 223.6 (H) 11/12/21 08:55 215.3 (H) 340.2 (H) 223.6 (H)  Kappa, lambda light chain ratio 0.26 - 1.65  0.02 (L) 11/12/21 08:55 0.03 (L) 0.02 (L) 0.02 (L)  (H): Data is abnormally high (L): Data is abnormally low  RADIOGRAPHIC STUDIES: I have personally reviewed the radiological images as listed and agreed with the findings in the report. No results found.   ASSESSMENT & PLAN:  Multiple myeloma not having  achieved remission ([place]) # RECURRENT Multiple myeloma stage III [high-risk cytogenetics-status post KRD-VGPR ; status post autologous stem cell transplant on 06/15/18.  Currently recurrent-on salvage therapy.Patient  currently on salvage therapy with daratumumab  lenalidomide [15 mg 3 w; 1-w] dexamethasone.    # Continue cycle #7-day 1  Dara SQ q2 weekly; continue REVLIMID 15 mg again. 3 weeks/1 week off; continue. Labs today reviewed;  acceptable for treatment today. JULY  2023- 0.8 gm; lamda LC= 1140-150s- overall STABLE/improving.  Will need weekly dex 20 mg/Thursdays.new  prescription for dexamethasone instructions. Will patient not take dexamethasone on the week of chemotherapy.  # Iatrogenic hypothyroidism [ Graves/goiter s/p total thyroidectomy;aug,2022 ]-  April 2023- TSH-LOW; decrease the dose of Synthroid to 100 mcg/day- AUG 2023- TSH- WNL.STABLE.  # Muscle cramps-?  Revlimid. improved  On tramadol q 12 hours prn-STABLE  Refill tramadol.  # Chronic diastolic CHF-clinicallySTABLE  # Bone lesions /last zometa on 11/27/2017.  Holding Zometa secondary to poor dentition.  Awaiting dental evaluation in AUG, 2023; will need hematology clearance prior patient dental extraction.appt- awaiting evaluation appt on OCT 2nd 2023- STABLE  # IV access: mediport- functional  :MM panel; K/l light chain ratio q 4 W  # DISPOSITION:  #  Dara today #  Follow up in 4 weeks- MD;Dara SQ; 'port- labs-cbc/cmp; MM panel; K/l light chain.  Dr.B     Orders Placed This Encounter  Procedures   Comprehensive metabolic panel    Standing Status:   Future    Standing Expiration Date:   05/21/2023   CBC with Differential    Standing Status:   Future    Standing Expiration Date:   05/21/2023   Comprehensive metabolic panel    Standing Status:   Future    Standing Expiration Date:   05/20/2023   Multiple Myeloma Panel (SPEP&IFE w/QIG)    Standing Status:   Future    Standing Expiration Date:   04/22/2023   Kappa/lambda light chains    Standing Status:   Future    Standing Expiration Date:   04/22/2023     All questions were answered. The patient knows to call the clinic with any problems, questions or concerns.       [name], MD 04/22/2022 10:24 AM

## 2022-04-22 NOTE — Assessment & Plan Note (Addendum)
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  Currently recurrent-on salvage therapy.Patient currently on salvage therapy with daratumumab  lenalidomide [15 mg 3 w; 1-w] dexamethasone.    # Continue cycle #7-day 1  Dara SQ q2 weekly; continue REVLIMID 15 mg again. 3 weeks/1 week off; continue. Labs today reviewed;  acceptable for treatment today. JULY  2023- 0.8 gm; lamda LC= 1140-150s- overall STABLE/improving.  Will need weekly dex 20 mg/Thursdays.new  prescription for dexamethasone instructions. Will patient not take dexamethasone on the week of chemotherapy.  # Iatrogenic hypothyroidism [ Graves/goiter s/p total thyroidectomy;aug,2022 ]-  April 2023- TSH-LOW; decrease the dose of Synthroid to 100 mcg/day- AUG 2023- TSH- WNL.STABLE.  # Muscle cramps-?  Revlimid. improved  On tramadol q 12 hours prn-STABLE  Refill tramadol.  # Chronic diastolic CHF-clinicallySTABLE  # Bone lesions /last zometa on 11/27/2017.  Holding Zometa secondary to poor dentition.  Awaiting dental evaluation in AUG, 2023; will need hematology clearance prior patient dental extraction.appt- awaiting evaluation appt on OCT 2nd 2023- STABLE  # IV access: mediport- functional  :MM panel; K/l light chain ratio q 4 W  # DISPOSITION:  #  Dara today #  Follow up in 4 weeks- MD;Dara SQ; 'port- labs-cbc/cmp; MM panel; K/l light chain.  Dr.B

## 2022-04-22 NOTE — Patient Instructions (Signed)
#   Take dexamethasone 20 mg [5 pills] weekly; Thursdays.   # Do not take dexamethasone in the week of chemotherapy.

## 2022-04-23 ENCOUNTER — Other Ambulatory Visit: Payer: Self-pay

## 2022-04-23 LAB — THYROID PANEL WITH TSH
Free Thyroxine Index: 3.3 (ref 1.2–4.9)
T3 Uptake Ratio: 30 % (ref 24–39)
T4, Total: 11.1 ug/dL (ref 4.5–12.0)
TSH: 2.55 u[IU]/mL (ref 0.450–4.500)

## 2022-04-23 LAB — KAPPA/LAMBDA LIGHT CHAINS
Kappa free light chain: 2.3 mg/L — ABNORMAL LOW (ref 3.3–19.4)
Kappa, lambda light chain ratio: 0.01 — ABNORMAL LOW (ref 0.26–1.65)
Lambda free light chains: 163.5 mg/L — ABNORMAL HIGH (ref 5.7–26.3)

## 2022-04-26 ENCOUNTER — Other Ambulatory Visit: Payer: Self-pay

## 2022-04-27 LAB — MULTIPLE MYELOMA PANEL, SERUM
Albumin SerPl Elph-Mcnc: 3.8 g/dL (ref 2.9–4.4)
Albumin/Glob SerPl: 1 (ref 0.7–1.7)
Alpha 1: 0.1 g/dL (ref 0.0–0.4)
Alpha2 Glob SerPl Elph-Mcnc: 0.8 g/dL (ref 0.4–1.0)
B-Globulin SerPl Elph-Mcnc: 1.4 g/dL — ABNORMAL HIGH (ref 0.7–1.3)
Gamma Glob SerPl Elph-Mcnc: 1.6 g/dL (ref 0.4–1.8)
Globulin, Total: 3.9 g/dL (ref 2.2–3.9)
IgA: 1167 mg/dL — ABNORMAL HIGH (ref 87–352)
IgG (Immunoglobin G), Serum: 482 mg/dL — ABNORMAL LOW (ref 586–1602)
IgM (Immunoglobulin M), Srm: 9 mg/dL — ABNORMAL LOW (ref 26–217)
M Protein SerPl Elph-Mcnc: 1 g/dL — ABNORMAL HIGH
Total Protein ELP: 7.7 g/dL (ref 6.0–8.5)

## 2022-05-11 ENCOUNTER — Other Ambulatory Visit: Payer: Self-pay | Admitting: *Deleted

## 2022-05-11 ENCOUNTER — Other Ambulatory Visit: Payer: Self-pay | Admitting: Internal Medicine

## 2022-05-11 DIAGNOSIS — C9002 Multiple myeloma in relapse: Secondary | ICD-10-CM

## 2022-05-11 MED ORDER — LENALIDOMIDE 15 MG PO CAPS: 15.0000 mg | ORAL_CAPSULE | Freq: Every day | ORAL | 0 refills | Status: DC

## 2022-05-21 ENCOUNTER — Inpatient Hospital Stay: Payer: Medicare Other | Attending: Internal Medicine | Admitting: Internal Medicine

## 2022-05-21 ENCOUNTER — Encounter: Payer: Self-pay | Admitting: Internal Medicine

## 2022-05-21 ENCOUNTER — Inpatient Hospital Stay: Payer: Medicare Other

## 2022-05-21 VITALS — BP 123/68 | HR 75 | Temp 97.0°F | Resp 16 | Wt 179.0 lb

## 2022-05-21 DIAGNOSIS — C9000 Multiple myeloma not having achieved remission: Secondary | ICD-10-CM

## 2022-05-21 DIAGNOSIS — Z5112 Encounter for antineoplastic immunotherapy: Secondary | ICD-10-CM | POA: Insufficient documentation

## 2022-05-21 DIAGNOSIS — Z79899 Other long term (current) drug therapy: Secondary | ICD-10-CM | POA: Insufficient documentation

## 2022-05-21 LAB — COMPREHENSIVE METABOLIC PANEL
ALT: 12 U/L (ref 0–44)
AST: 19 U/L (ref 15–41)
Albumin: 3.5 g/dL (ref 3.5–5.0)
Alkaline Phosphatase: 76 U/L (ref 38–126)
Anion gap: 9 (ref 5–15)
BUN: 10 mg/dL (ref 8–23)
CO2: 23 mmol/L (ref 22–32)
Calcium: 7.9 mg/dL — ABNORMAL LOW (ref 8.9–10.3)
Chloride: 108 mmol/L (ref 98–111)
Creatinine, Ser: 0.87 mg/dL (ref 0.44–1.00)
GFR, Estimated: >60 mL/min (ref >60)
Glucose, Bld: 97 mg/dL (ref 70–99)
Potassium: 3.3 mmol/L — ABNORMAL LOW (ref 3.5–5.1)
Sodium: 140 mmol/L (ref 135–145)
Total Bilirubin: 0.5 mg/dL (ref 0.3–1.2)
Total Protein: 7.5 g/dL (ref 6.5–8.1)

## 2022-05-21 LAB — CBC WITH DIFFERENTIAL/PLATELET
Abs Immature Granulocytes: 0.01 10*3/uL (ref 0.00–0.07)
Basophils Absolute: 0 10*3/uL (ref 0.0–0.1)
Basophils Relative: 1 %
Eosinophils Absolute: 0.4 10*3/uL (ref 0.0–0.5)
Eosinophils Relative: 6 %
HCT: 31.5 % — ABNORMAL LOW (ref 36.0–46.0)
Hemoglobin: 10.2 g/dL — ABNORMAL LOW (ref 12.0–15.0)
Immature Granulocytes: 0 %
Lymphocytes Relative: 49 %
Lymphs Abs: 3.3 10*3/uL (ref 0.7–4.0)
MCH: 30.4 pg (ref 26.0–34.0)
MCHC: 32.4 g/dL (ref 30.0–36.0)
MCV: 93.8 fL (ref 80.0–100.0)
Monocytes Absolute: 0.4 10*3/uL (ref 0.1–1.0)
Monocytes Relative: 6 %
Neutro Abs: 2.5 10*3/uL (ref 1.7–7.7)
Neutrophils Relative %: 38 %
Platelets: 197 10*3/uL (ref 150–400)
RBC: 3.36 MIL/uL — ABNORMAL LOW (ref 3.87–5.11)
RDW: 16.2 % — ABNORMAL HIGH (ref 11.5–15.5)
WBC: 6.6 10*3/uL (ref 4.0–10.5)
nRBC: 0 % (ref 0.0–0.2)

## 2022-05-21 MED ORDER — ACETAMINOPHEN 325 MG PO TABS
650.0000 mg | ORAL_TABLET | Freq: Once | ORAL | Status: AC
  Administered 2022-05-21: 650 mg via ORAL
  Filled 2022-05-21: qty 2

## 2022-05-21 MED ORDER — DIPHENHYDRAMINE HCL 25 MG PO CAPS
50.0000 mg | ORAL_CAPSULE | Freq: Once | ORAL | Status: AC
  Administered 2022-05-21: 50 mg via ORAL
  Filled 2022-05-21: qty 2

## 2022-05-21 MED ORDER — DEXAMETHASONE 4 MG PO TABS
20.0000 mg | ORAL_TABLET | Freq: Once | ORAL | Status: AC
  Administered 2022-05-21: 20 mg via ORAL
  Filled 2022-05-21: qty 5

## 2022-05-21 MED ORDER — DARATUMUMAB-HYALURONIDASE-FIHJ 1800-30000 MG-UT/15ML ~~LOC~~ SOLN
1800.0000 mg | Freq: Once | SUBCUTANEOUS | Status: AC
  Administered 2022-05-21: 1800 mg via SUBCUTANEOUS
  Filled 2022-05-21: qty 15

## 2022-05-21 MED ORDER — MONTELUKAST SODIUM 10 MG PO TABS
10.0000 mg | ORAL_TABLET | Freq: Once | ORAL | Status: AC
  Administered 2022-05-21: 10 mg via ORAL
  Filled 2022-05-21: qty 1

## 2022-05-21 NOTE — Assessment & Plan Note (Addendum)
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  Currently recurrent-on salvage therapy.Patient currently on salvage therapy with daratumumab  lenalidomide [15 mg 3 w; 1-w] dexamethasone.    # Continue cycle #8 day 1  Dara SQ q2 weekly; continue REVLIMID 15 mg again. 3 weeks/1 week off; continue. Labs today reviewed;  acceptable for treatment today. SEP   2023- 0.2gm; SEP 2023-lamda LC= 150s-160s- overall STABLE/improving.  Continue weekly dex 20 mg/Thursdays. STABLE.   # Iatrogenic hypothyroidism [ Graves/goiter s/p total thyroidectomy;aug,2022 ]-  April 2023- TSH-LOW; decrease the dose of Synthroid to 100 mcg/day- AUG 2023- TSH- WNL.STABLE.  # Muscle cramps-?  Revlimid. improved  On tramadol q 12 hours prn-Refill tramadol.STABLE    # Chronic diastolic CHF-clinicallySTABLE  # Bone lesions /last zometa on 11/27/2017.  Holding Zometa secondary to poor dentition.  Awaiting dental evaluation in AUG, 2023; will need hematology clearance prior patient dental extraction.appt- awaiting evaluation appt on OCT 2nd 2023- STABLE  # IV access: mediport- functional  :MM panel; K/l light chain ratio q 4 W  5W-thx giving # DISPOSITION:  #  Dara today #  Follow up in 5 weeks- MD;Dara SQ; 'port- labs-cbc/cmp; MM panel; K/l light chain.  Dr.B

## 2022-05-21 NOTE — Progress Notes (Signed)
[facility] OFFICE PROGRESS NOTE  Patient Care Team: Marguerita Merles, MD as PCP - General (Family Medicine) End, Harrell Gave, MD as PCP - Cardiology (Cardiology) [name], [name] [name], MD as Consulting Physician (Hematology and Oncology) Ottie Glazier, MD as Consulting Physician (Pulmonary Disease) Gabriel Carina Betsey Holiday, MD as Consulting Physician (Endocrinology)   Cancer Staging  No matching staging information was found for the patient.   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 No morphologic support for residual myeloma disease. Negative for minimal residual disease by MM-MRD flow cytometry; HOLD REVLIMID [leg swelling]; AUG 2021-PET scan negative for myeloma; continue to hold Revlimid  # MARCH 2021-diastolic congestive heart failure [Dr.End]  # BMBx- OCT 2021- BMBx- 5% plasmacytosis-however this appears to be more polyclonal rather than monoclonal-not explain patient worsening anemia.   # OCT 2022- 18th- Dara- Rev-Dex  #November 2021-hyperthyroidism/goiter-  [D.Bennett/Dr.Solum]-methimazole. S/p Thyroidectomy [Dr.Kim; [facility]24] --------------------------  # 12/12- RIGHT JUGULAR DVT-x 375mn xarelto; finished April 2020; September 2020-EGD/dysphagia; Dr. AnVicente Males# Acute renal failure [Dr.Singh; Proteinuria 1.5gm/day ]; acyclovir/Asprin ------------------------------------------------------------------------------------------------------------   DIAGNOSIS: _0  MULTIPLE MYELOMA  STAGE: III/HIGH RISK ;GOALS: CONTROL  CURRENT/MOST RECENT THERAPY-maintenance Revlimid- on HOLD    [place]21[place] - 04/28/2018 Chemotherapy   Patient is on Treatment Plan : MYELOMA SALVAGE Cyclophosphamide / Carfilzomib / Dexamethasone (CCd) q28d     05/12/2021 - 02/25/2022 Chemotherapy   Patient is on Treatment Plan : MYELOMA RELAPSED REFRACTORY Daratumumab SQ + Lenalidomide + Dexamethasone (DaraRd) q28d     05/12/2021 -  Chemotherapy   Patient is on Treatment Plan : MYELOMA RELAPSED REFRACTORY Daratumumab SQ + Lenalidomide + Dexamethasone (DaraRd) q28d      INTERVAL HISTORY: Ambulating independently.  Alone.  [name]4.o.  female pleasant patient above history of relapsed multiple myeloma--currently on daratumumab-Revlimid-dexamethasone  is here for follow-up.  Patient here for oncology follow-up appointment, concerns of SOB only with exertion. Not any worse. Denies any cough.  On legs swelling.   Continues to complain of intermittent cramping/muscle spasm.  Chronic mild muscle spasms needing tramadol every 12 hours also.   Review of Systems  Constitutional:  Positive for malaise/fatigue. Negative for chills, diaphoresis and fever.  HENT:  Negative for nosebleeds and sore throat.   Eyes:  Negative for double vision.  Respiratory:  Negative for hemoptysis.   Cardiovascular:  Negative for chest pain, palpitations and orthopnea.  Gastrointestinal:  Negative for abdominal pain, blood in stool,  constipation, heartburn and melena.  Genitourinary:  Negative for dysuria, frequency and urgency.  Musculoskeletal:  Positive for back pain.  Skin: Negative.  Negative for itching and rash.  Neurological:  Negative for dizziness, tingling, focal weakness,  weakness and headaches.  Endo/Heme/Allergies:  Does not bruise/bleed easily.  Psychiatric/Behavioral:  Negative for depression. The patient is not nervous/anxious.     PAST MEDICAL HISTORY :  Past Medical History:  Diagnosis Date   (HFpEF) heart failure with preserved ejection fraction ([place])    a. 06/2020 Echo: EF 60-65%, no rwma, nl RV size/fxn. Triv MR. Triv TR/AI. Mod elev PASP.   Anxiety    COPD (chronic obstructive pulmonary disease) (HCC)    GERD (gastroesophageal reflux disease)    Hypertension    Hyperthyroidism    Hypokalemia    IgA myeloma (HCC)    Multiple myeloma (HCC)    Pneumonia    Red blood cell antibody positive, compatible PRBC difficult to obtain    S/P autologous bone marrow transplantation ([place])     PAST SURGICAL HISTORY :   Past Surgical History:  Procedure Laterality Date   ANTERIOR VITRECTOMY Left 10/07/2021   Procedure: ANTERIOR VITRECTOMY;  Surgeon: Leandrew Koyanagi, MD;  Location: [place];  Service: Ophthalmology;  Laterality: Left;   CATARACT EXTRACTION W/PHACO Left 10/07/2021   Procedure: CATARACT EXTRACTION PHACO AND INTRAOCULAR LENS PLACEMENT ([place]) LEFT VISION BLUE;  Surgeon: Leandrew Koyanagi, MD;  Location: [place];  Service: Ophthalmology;  Laterality: Left;  kit for manual incision 14.56 01:21.4   CATARACT EXTRACTION W/PHACO Right 10/21/2021   Procedure: CATARACT EXTRACTION PHACO AND INTRAOCULAR LENS PLACEMENT ([facility]) RIGHT 11.12 01:48.1;  Surgeon: Leandrew Koyanagi, MD;  Location: [place];  Service: Ophthalmology;  Laterality: Right;   ESOPHAGOGASTRODUODENOSCOPY (EGD) WITH PROPOFOL N/A 03/30/2019   Procedure: ESOPHAGOGASTRODUODENOSCOPY (EGD) WITH  PROPOFOL;  Surgeon: Jonathon Bellows, MD;  Location: [facility];  Service: Gastroenterology;  Laterality: N/A;   IR FLUORO GUIDE PORT INSERTION RIGHT  12/16/2017   IR IMAGING GUIDED PORT INSERTION  06/15/2021   THYROIDECTOMY N/A     FAMILY HISTORY :   Family History  Problem Relation Age of Onset   Pneumonia Mother    Seizures Father     SOCIAL HISTORY:   Social History   Tobacco Use   Smoking status: Former    Packs/day: 0.25    Types: Cigarettes    Quit date: 03/18/2021    Years since quitting: 1.1   Smokeless tobacco: Never   Tobacco comments:    2 cigarettes QOD  Vaping Use   Vaping Use: Never used  Substance Use Topics   Alcohol use: No   Drug use: No    ALLERGIES:  has No Known Allergies.  MEDICATIONS:  Current Outpatient Medications  Medication Sig Dispense Refill   acyclovir (ZOVIRAX) 400 MG tablet Take 1 tablet (400 mg total) by mouth 2 (two) times daily. 60 tablet 3   albuterol (VENTOLIN HFA) 108 (90 Base) MCG/ACT inhaler Inhale 2 puffs into the lungs every 6 (six) hours as needed for wheezing or shortness of breath. 8 g 3   aspirin EC 81 MG EC tablet Take 1 tablet (81 mg total) by mouth daily. 90 tablet 3   buPROPion (WELLBUTRIN) 75 MG tablet Take 75 mg by mouth daily.     calcium-vitamin D (OSCAL WITH D) 500MG-200UNIT (5MCG) tablet Take 1 tablet by mouth 2 (two) times daily. 60 tablet 0   carvedilol (COREG) 3.125 MG tablet Take 1 tablet (3.125 mg total) by mouth 2 (two) times daily with a meal. 60 tablet 5   dexamethasone (DECADRON) 4 MG tablet Take dexamethasone 20 mg [5 pills] weekly on Thursdays. # Do not take dexamethasone in the week of  chemotherapy. Take in the mornings 60 tablet 4   Ensure Plus (ENSURE PLUS) LIQD Take 237 mLs by mouth 3 (three) times a week.     furosemide (LASIX) 40 MG tablet Take 1 tablet (40 mg total) by mouth daily. 90 tablet 3   lenalidomide (REVLIMID) 15 MG capsule Take 1 capsule (15 mg total) by mouth daily. Take for 21 days, then  hold for 7 days. Repeat every 28 days. 21 capsule 0   levothyroxine (SYNTHROID) 100 MCG tablet TAKE 1 TABLET BY MOUTH BEFORE BREAKFAST 90 tablet 0   lidocaine-prilocaine (EMLA) cream Apply 1 application topically as needed. Apply to port and cover with saran wrap 1-2 hours prior to port access 30 g 1   loperamide (IMODIUM) 2 MG capsule Take 2 mg by mouth as needed for diarrhea or loose stools.     magnesium oxide (MAG-OX) 400 (240 Mg) MG tablet Take 1 tablet (400 mg total) by mouth 2 (two) times daily. 60 tablet 3   montelukast (SINGULAIR) 10 MG tablet Take 1 tablet (10 mg total) by mouth at bedtime. 30 tablet 3   ondansetron (ZOFRAN) 8 MG tablet One pill every 8 hours as needed for nausea/vomitting. 40 tablet 1   pantoprazole (PROTONIX) 40 MG tablet Take 1 tablet (40 mg total) by mouth daily. 90 tablet 0   polyethylene glycol (MIRALAX / GLYCOLAX) packet Take 17 g by mouth daily as needed for mild constipation. 14 each 0   potassium chloride SA (KLOR-CON M) 20 MEQ tablet Take 1 tablet (20 mEq total) by mouth 2 (two) times daily. 60 tablet 6   spironolactone (ALDACTONE) 25 MG tablet Take 1 tablet by mouth once daily 90 tablet 3   traMADol (ULTRAM) 50 MG tablet Take 1 tablet (50 mg total) by mouth every 12 (twelve) hours as needed. 60 tablet 0   Vitamin D, Ergocalciferol, (DRISDOL) 1.25 MG (50000 UNIT) CAPS capsule Take 1 capsule by mouth once a week 12 capsule 0   No current facility-administered medications for this visit.   Facility-Administered Medications Ordered in Other Visits  Medication Dose Route Frequency Provider Last Rate Last Admin   0.9 %  sodium chloride infusion   Intravenous Continuous Monia Sabal, PA-C       daratumumab-hyaluronidase-fihj (DARZALEX FASPRO) 1800-30000 MG-UT/15ML chemo SQ injection 1,800 mg  1,800 mg Subcutaneous Once [name], MD        PHYSICAL EXAMINATION: ECOG PERFORMANCE STATUS: 1 - Symptomatic but completely ambulatory  BP 123/68 (Patient  Position: Sitting)   Pulse 75   Temp (!) 97 F (36.1 C) (Tympanic)   Resp 16   Wt 179 lb (81.2 kg)   SpO2 100%   BMI 32.74 kg/m   Filed Weights   05/21/22 0929  Weight: 179 lb (81.2 kg)     Physical Exam HENT:     Head: Normocephalic and atraumatic.  Eyes:     Pupils: Pupils are equal, round, and reactive to light.  Cardiovascular:     Rate and Rhythm: Normal rate and regular rhythm.  Pulmonary:     Effort: Pulmonary effort is normal. No respiratory distress.     Breath sounds: Normal breath sounds. No wheezing.  Abdominal:     General: Bowel sounds are normal. There is no distension.     Palpations: Abdomen is soft. There is no mass.     Tenderness: There is no abdominal tenderness. There is no guarding or rebound.  Musculoskeletal:        General:  No tenderness. Normal range of motion.     Cervical back: Normal range of motion and neck supple.  Skin:    General: Skin is warm.  Neurological:     Mental Status: She is alert and oriented to person, place, and time.  Psychiatric:        Mood and Affect: Affect normal.    LABORATORY DATA:  I have reviewed the data as listed    Component Value Date/Time   NA 140 05/21/2022 0914   NA 141 01/25/2020 1530   K 3.3 (L) 05/21/2022 0914   CL 108 05/21/2022 0914   CO2 23 05/21/2022 0914   GLUCOSE 97 05/21/2022 0914   BUN 10 05/21/2022 0914   BUN 19 01/25/2020 1530   CREATININE 0.87 05/21/2022 0914   CALCIUM 7.9 (L) 05/21/2022 0914   PROT 7.5 05/21/2022 0914   ALBUMIN 3.5 05/21/2022 0914   AST 19 05/21/2022 0914   ALT 12 05/21/2022 0914   ALKPHOS 76 05/21/2022 0914   BILITOT 0.5 05/21/2022 0914   GFRNONAA >60 05/21/2022 0914   GFRAA NOT CALCULATED 04/17/2020 1352    No results found for: "SPEP", "UPEP"  Lab Results  Component Value Date   WBC 6.6 05/21/2022   NEUTROABS 2.5 05/21/2022   HGB 10.2 (L) 05/21/2022   HCT 31.5 (L) 05/21/2022   MCV 93.8 05/21/2022   PLT 197 05/21/2022      Chemistry       Component Value Date/Time   NA 140 05/21/2022 0914   NA 141 01/25/2020 1530   K 3.3 (L) 05/21/2022 0914   CL 108 05/21/2022 0914   CO2 23 05/21/2022 0914   BUN 10 05/21/2022 0914   BUN 19 01/25/2020 1530   CREATININE 0.87 05/21/2022 0914      Component Value Date/Time   CALCIUM 7.9 (L) 05/21/2022 0914   ALKPHOS 76 05/21/2022 0914   AST 19 05/21/2022 0914   ALT 12 05/21/2022 0914   BILITOT 0.5 05/21/2022 0914      (H): Data is abnormally high   Latest Reference Range & Units Most Recent 12/24/20 14:01 04/01/21 14:34 04/28/21 09:17 06/11/21 09:42 07/03/21 10:32 08/20/21 10:06  M Protein SerPl Elph-Mcnc Not Observed g/dL 1.1 (H) (C) 08/20/21 10:06 Not Observed (C) 0.6 (H) (C) 0.7 (H) (C) 0.5 (H) (C) 0.7 (H) (C) 1.1 (H) (C)    Latest Reference Range & Units Most Recent 09/17/21 08:47 10/15/21 08:25 11/12/21 08:55  Kappa free light chain 3.3 - 19.4 mg/L 5.0 11/12/21 08:55 7.2 7.2 5.0  Lambda free light chains 5.7 - 26.3 mg/L 223.6 (H) 11/12/21 08:55 215.3 (H) 340.2 (H) 223.6 (H)  Kappa, lambda light chain ratio 0.26 - 1.65  0.02 (L) 11/12/21 08:55 0.03 (L) 0.02 (L) 0.02 (L)  (H): Data is abnormally high (L): Data is abnormally low  RADIOGRAPHIC STUDIES: I have personally reviewed the radiological images as listed and agreed with the findings in the report. No results found.   ASSESSMENT & PLAN:  Multiple myeloma not having achieved remission ([place]) # RECURRENT Multiple myeloma stage III [high-risk cytogenetics-status post KRD-VGPR ; status post autologous stem cell transplant on 06/15/18.  Currently recurrent-on salvage therapy.Patient currently on salvage therapy with daratumumab  lenalidomide [15 mg 3 w; 1-w] dexamethasone.    # Continue cycle #8 day 1  Dara SQ q2 weekly; continue REVLIMID 15 mg again. 3 weeks/1 week off; continue. Labs today reviewed;  acceptable for treatment today. SEP   2023- 0.2gm; SEP 2023-lamda LC= 150s-160s- overall  STABLE/improving.  Continue weekly dex  20 mg/Thursdays. STABLE.   # Iatrogenic hypothyroidism [ Graves/goiter s/p total thyroidectomy;aug,2022 ]-  April 2023- TSH-LOW; decrease the dose of Synthroid to 100 mcg/day- AUG 2023- TSH- WNL.STABLE.  # Muscle cramps-?  Revlimid. improved  On tramadol q 12 hours prn-Refill tramadol.STABLE    # Chronic diastolic CHF-clinicallySTABLE  # Bone lesions /last zometa on 11/27/2017.  Holding Zometa secondary to poor dentition.  Awaiting dental evaluation in AUG, 2023; will need hematology clearance prior patient dental extraction.appt- awaiting evaluation appt on OCT 2nd 2023- STABLE  # IV access: mediport- functional  :MM panel; K/l light chain ratio q 4 W  5W-thx giving # DISPOSITION:  #  Dara today #  Follow up in 5 weeks- MD;Dara SQ; 'port- labs-cbc/cmp; MM panel; K/l light chain.  Dr.B      Orders Placed This Encounter  Procedures   Comprehensive metabolic panel    Standing Status:   Future    Standing Expiration Date:   06/26/2023   CBC with Differential    Standing Status:   Future    Standing Expiration Date:   06/26/2023   Comprehensive metabolic panel    Standing Status:   Future    Standing Expiration Date:   06/25/2023   Multiple Myeloma Panel (SPEP&IFE w/QIG)    Standing Status:   Future    Standing Expiration Date:   05/22/2023   Kappa/lambda light chains    Standing Status:   Future    Standing Expiration Date:   05/22/2023     All questions were answered. The patient knows to call the clinic with any problems, questions or concerns.      [name], MD 05/21/2022 10:11 AM

## 2022-05-21 NOTE — Patient Instructions (Signed)
[facility]  Discharge Instructions: Thank you for choosing [facility] to provide your oncology and hematology care.  If you have a lab appointment with the Cancer Center, please go directly to the Cancer Center and check in at the registration area.  Wear comfortable clothing and clothing appropriate for easy access to any Portacath or PICC line.   We strive to give you quality time with your provider. You may need to reschedule your appointment if you arrive late (15 or more minutes).  Arriving late affects you and other patients whose appointments are after yours.  Also, if you miss three or more appointments without notifying the office, you may be dismissed from the clinic at the provider's discretion.      For prescription refill requests, have your pharmacy contact our office and allow 72 hours for refills to be completed.    Today you received the following chemotherapy and/or immunotherapy agents: Darzalex Faspro      To help prevent nausea and vomiting after your treatment, we encourage you to take your nausea medication as directed.  BELOW ARE SYMPTOMS THAT SHOULD BE REPORTED IMMEDIATELY: *FEVER GREATER THAN 100.4 F (38 C) OR HIGHER *CHILLS OR SWEATING *NAUSEA AND VOMITING THAT IS NOT CONTROLLED WITH YOUR NAUSEA MEDICATION *UNUSUAL SHORTNESS OF BREATH *UNUSUAL BRUISING OR BLEEDING *URINARY PROBLEMS (pain or burning when urinating, or frequent urination) *BOWEL PROBLEMS (unusual diarrhea, constipation, pain near the anus) TENDERNESS IN MOUTH AND THROAT WITH OR WITHOUT PRESENCE OF ULCERS (sore throat, sores in mouth, or a toothache) UNUSUAL RASH, SWELLING OR PAIN  UNUSUAL VAGINAL DISCHARGE OR ITCHING   Items with * indicate a potential emergency and should be followed up as soon as possible or go to the Emergency Department if any problems should occur.  Please show the CHEMOTHERAPY ALERT CARD or IMMUNOTHERAPY ALERT CARD at  check-in to the Emergency Department and triage nurse.  Should you have questions after your visit or need to cancel or reschedule your appointment, please contact [facility]  [phone] and follow the prompts.  Office hours are 8:00 a.m. to 4:30 p.m. Monday - Friday. Please note that voicemails left after 4:00 p.m. may not be returned until the following business day.  We are closed weekends and major holidays. You have access to a nurse at all times for urgent questions. Please call the main number to the clinic [phone] and follow the prompts.  For any non-urgent questions, you may also contact your provider using MyChart. We now offer e-Visits for anyone 18 and older to request care online for non-urgent symptoms. For details visit [email or web address].   Also download the MyChart app! Go to the app store, search "MyChart", open the app, select [facility], and log in with your MyChart username and password.  Masks are optional in the cancer centers. If you would like for your care team to wear a mask while they are taking care of you, please let them know. For doctor visits, patients may have with them one support person who is at least 70 years old. At this time, visitors are not allowed in the infusion area.   

## 2022-05-21 NOTE — Progress Notes (Signed)
Patient here for oncology follow-up appointment, concerns of SOB   °

## 2022-05-22 ENCOUNTER — Other Ambulatory Visit: Payer: Self-pay

## 2022-05-24 LAB — KAPPA/LAMBDA LIGHT CHAINS
Kappa free light chain: 4 mg/L (ref 3.3–19.4)
Kappa, lambda light chain ratio: 0.02 — ABNORMAL LOW (ref 0.26–1.65)
Lambda free light chains: 234.5 mg/L — ABNORMAL HIGH (ref 5.7–26.3)

## 2022-05-26 LAB — MULTIPLE MYELOMA PANEL, SERUM
Albumin SerPl Elph-Mcnc: 3.5 g/dL (ref 2.9–4.4)
Albumin/Glob SerPl: 1.1 (ref 0.7–1.7)
Alpha 1: 0.1 g/dL (ref 0.0–0.4)
Alpha2 Glob SerPl Elph-Mcnc: 0.8 g/dL (ref 0.4–1.0)
B-Globulin SerPl Elph-Mcnc: 2.3 g/dL — ABNORMAL HIGH (ref 0.7–1.3)
Gamma Glob SerPl Elph-Mcnc: 0.3 g/dL — ABNORMAL LOW (ref 0.4–1.8)
Globulin, Total: 3.5 g/dL (ref 2.2–3.9)
IgA: 701 mg/dL — ABNORMAL HIGH (ref 87–352)
IgG (Immunoglobin G), Serum: 364 mg/dL — ABNORMAL LOW (ref 586–1602)
IgM (Immunoglobulin M), Srm: 6 mg/dL — ABNORMAL LOW (ref 26–217)
M Protein SerPl Elph-Mcnc: 0.9 g/dL — ABNORMAL HIGH
Total Protein ELP: 7 g/dL (ref 6.0–8.5)

## 2022-05-27 ENCOUNTER — Other Ambulatory Visit: Payer: Self-pay

## 2022-06-14 ENCOUNTER — Other Ambulatory Visit: Payer: Self-pay | Admitting: *Deleted

## 2022-06-14 DIAGNOSIS — C9002 Multiple myeloma in relapse: Secondary | ICD-10-CM

## 2022-06-14 MED ORDER — LENALIDOMIDE 15 MG PO CAPS: 15.0000 mg | ORAL_CAPSULE | Freq: Every day | ORAL | 0 refills | Status: DC

## 2022-06-21 ENCOUNTER — Other Ambulatory Visit: Payer: Self-pay | Admitting: Internal Medicine

## 2022-06-25 ENCOUNTER — Inpatient Hospital Stay: Payer: Medicare Other

## 2022-06-25 ENCOUNTER — Inpatient Hospital Stay: Payer: Medicare Other | Attending: Nurse Practitioner | Admitting: Internal Medicine

## 2022-06-25 ENCOUNTER — Encounter: Payer: Self-pay | Admitting: Internal Medicine

## 2022-06-25 VITALS — BP 132/82 | HR 50 | Temp 98.6°F | Resp 20 | Wt 180.4 lb

## 2022-06-25 DIAGNOSIS — C9000 Multiple myeloma not having achieved remission: Secondary | ICD-10-CM | POA: Diagnosis not present

## 2022-06-25 DIAGNOSIS — Z79899 Other long term (current) drug therapy: Secondary | ICD-10-CM | POA: Insufficient documentation

## 2022-06-25 DIAGNOSIS — Z5112 Encounter for antineoplastic immunotherapy: Secondary | ICD-10-CM | POA: Insufficient documentation

## 2022-06-25 DIAGNOSIS — Z23 Encounter for immunization: Secondary | ICD-10-CM | POA: Diagnosis not present

## 2022-06-25 DIAGNOSIS — Z95828 Presence of other vascular implants and grafts: Secondary | ICD-10-CM

## 2022-06-25 LAB — CBC WITH DIFFERENTIAL/PLATELET
Abs Immature Granulocytes: 0.01 10*3/uL (ref 0.00–0.07)
Basophils Absolute: 0 10*3/uL (ref 0.0–0.1)
Basophils Relative: 0 %
Eosinophils Absolute: 0.3 10*3/uL (ref 0.0–0.5)
Eosinophils Relative: 6 %
HCT: 32.6 % — ABNORMAL LOW (ref 36.0–46.0)
Hemoglobin: 10.3 g/dL — ABNORMAL LOW (ref 12.0–15.0)
Immature Granulocytes: 0 %
Lymphocytes Relative: 46 %
Lymphs Abs: 2.4 10*3/uL (ref 0.7–4.0)
MCH: 29.9 pg (ref 26.0–34.0)
MCHC: 31.6 g/dL (ref 30.0–36.0)
MCV: 94.5 fL (ref 80.0–100.0)
Monocytes Absolute: 0.1 10*3/uL (ref 0.1–1.0)
Monocytes Relative: 3 %
Neutro Abs: 2.3 10*3/uL (ref 1.7–7.7)
Neutrophils Relative %: 45 %
Platelets: 201 10*3/uL (ref 150–400)
RBC: 3.45 MIL/uL — ABNORMAL LOW (ref 3.87–5.11)
RDW: 17.4 % — ABNORMAL HIGH (ref 11.5–15.5)
WBC: 5.2 10*3/uL (ref 4.0–10.5)
nRBC: 0 % (ref 0.0–0.2)

## 2022-06-25 LAB — COMPREHENSIVE METABOLIC PANEL
ALT: 14 U/L (ref 0–44)
AST: 20 U/L (ref 15–41)
Albumin: 3.8 g/dL (ref 3.5–5.0)
Alkaline Phosphatase: 68 U/L (ref 38–126)
Anion gap: 11 (ref 5–15)
BUN: 20 mg/dL (ref 8–23)
CO2: 27 mmol/L (ref 22–32)
Calcium: 9.1 mg/dL (ref 8.9–10.3)
Chloride: 104 mmol/L (ref 98–111)
Creatinine, Ser: 0.88 mg/dL (ref 0.44–1.00)
GFR, Estimated: >60 mL/min (ref >60)
Glucose, Bld: 105 mg/dL — ABNORMAL HIGH (ref 70–99)
Potassium: 3 mmol/L — ABNORMAL LOW (ref 3.5–5.1)
Sodium: 142 mmol/L (ref 135–145)
Total Bilirubin: 0.7 mg/dL (ref 0.3–1.2)
Total Protein: 7.3 g/dL (ref 6.5–8.1)

## 2022-06-25 MED ORDER — DARATUMUMAB-HYALURONIDASE-FIHJ 1800-30000 MG-UT/15ML ~~LOC~~ SOLN
1800.0000 mg | Freq: Once | SUBCUTANEOUS | Status: AC
  Administered 2022-06-25: 1800 mg via SUBCUTANEOUS
  Filled 2022-06-25: qty 15

## 2022-06-25 MED ORDER — INFLUENZA VAC A&B SA ADJ QUAD 0.5 ML IM PRSY
0.5000 mL | PREFILLED_SYRINGE | Freq: Once | INTRAMUSCULAR | Status: AC
  Administered 2022-06-25: 0.5 mL via INTRAMUSCULAR
  Filled 2022-06-25: qty 0.5

## 2022-06-25 MED ORDER — HEPARIN SOD (PORK) LOCK FLUSH 100 UNIT/ML IV SOLN
500.0000 [IU] | Freq: Once | INTRAVENOUS | Status: AC
  Administered 2022-06-25: 500 [IU] via INTRAVENOUS
  Filled 2022-06-25: qty 5

## 2022-06-25 MED ORDER — MONTELUKAST SODIUM 10 MG PO TABS
10.0000 mg | ORAL_TABLET | Freq: Once | ORAL | Status: AC
  Administered 2022-06-25: 10 mg via ORAL

## 2022-06-25 MED ORDER — DIPHENHYDRAMINE HCL 25 MG PO CAPS
50.0000 mg | ORAL_CAPSULE | Freq: Once | ORAL | Status: AC
  Administered 2022-06-25: 50 mg via ORAL
  Filled 2022-06-25: qty 2

## 2022-06-25 MED ORDER — BENZONATATE 100 MG PO CAPS: 100.0000 mg | ORAL_CAPSULE | Freq: Two times a day (BID) | ORAL | 0 refills | Status: DC | PRN

## 2022-06-25 MED ORDER — ACETAMINOPHEN 325 MG PO TABS
650.0000 mg | ORAL_TABLET | Freq: Once | ORAL | Status: AC
  Administered 2022-06-25: 650 mg via ORAL
  Filled 2022-06-25: qty 2

## 2022-06-25 MED ORDER — DEXAMETHASONE 4 MG PO TABS
20.0000 mg | ORAL_TABLET | Freq: Once | ORAL | Status: AC
  Administered 2022-06-25: 20 mg via ORAL
  Filled 2022-06-25: qty 5

## 2022-06-25 NOTE — Progress Notes (Signed)
[facility] OFFICE PROGRESS NOTE  Patient Care Team: Marguerita Merles, MD as PCP - General (Family Medicine) End, Harrell Gave, MD as PCP - Cardiology (Cardiology) [name], [name] [name], MD as Consulting Physician (Hematology and Oncology) Ottie Glazier, MD as Consulting Physician (Pulmonary Disease) Gabriel Carina Betsey Holiday, MD as Consulting Physician (Endocrinology)   Cancer Staging  No matching staging information was found for the patient.   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 No morphologic support for residual myeloma disease. Negative for minimal residual disease by MM-MRD flow cytometry; HOLD REVLIMID [leg swelling]; AUG 2021-PET scan negative for myeloma; continue to hold Revlimid  # MARCH 2021-diastolic congestive heart failure [Dr.End]  # BMBx- OCT 2021- BMBx- 5% plasmacytosis-however this appears to be more polyclonal rather than monoclonal-not explain patient worsening anemia.   # OCT 2022- 18th- Dara- Rev-Dex  #November 2021-hyperthyroidism/goiter-  [D.Bennett/Dr.Solum]-methimazole. S/p Thyroidectomy [Dr.Kim; [facility]17] --------------------------  # 12/12- RIGHT JUGULAR DVT-x 345mn xarelto; finished April 2020; September 2020-EGD/dysphagia; Dr. AnVicente Males# Acute renal failure [Dr.Singh; Proteinuria 1.5gm/day ]; acyclovir/Asprin ------------------------------------------------------------------------------------------------------------   DIAGNOSIS: _0  MULTIPLE MYELOMA  STAGE: III/HIGH RISK ;GOALS: CONTROL  CURRENT/MOST RECENT THERAPY-maintenance Revlimid- on HOLD    Multiple myeloma not having achieved remission ([facility] 11/21/2017 - 04/28/2018 Chemotherapy   Patient is on Treatment Plan : MYELOMA SALVAGE Cyclophosphamide / Carfilzomib / Dexamethasone (CCd) q28d     05/12/2021 - 02/25/2022 Chemotherapy   Patient is on Treatment Plan : MYELOMA RELAPSED REFRACTORY Daratumumab SQ + Lenalidomide + Dexamethasone (DaraRd) q28d     05/12/2021 -  Chemotherapy   Patient is on Treatment Plan : MYELOMA RELAPSED REFRACTORY Daratumumab SQ + Lenalidomide + Dexamethasone (DaraRd) q28d      INTERVAL HISTORY: Ambulating independently.  Alone.  [name]7.o.  female pleasant patient above history of relapsed multiple myeloma--currently on daratumumab-Revlimid-dexamethasone  is here for follow-up.  Patient states she has been having trouble with her back and is also having some trouble sleeping. Patient states she has a real bad cough and is requesting a prescription for cough medicine.   Continues to complain of intermittent cramping/muscle spasm.  Chronic mild muscle spasms needing tramadol every 12 hours also.   Review of Systems  Constitutional:  Positive for malaise/fatigue. Negative for chills, diaphoresis and fever.  HENT:  Negative for nosebleeds and sore throat.   Eyes:  Negative for double vision.  Respiratory:  Negative for hemoptysis.   Cardiovascular:  Negative for chest pain, palpitations and orthopnea.   Gastrointestinal:  Negative for abdominal pain, blood in stool, constipation, heartburn and melena.  Genitourinary:  Negative for dysuria, frequency and urgency.  Musculoskeletal:  Positive for back pain.  Skin: Negative.  Negative for itching  and rash.  Neurological:  Negative for dizziness, tingling, focal weakness, weakness and headaches.  Endo/Heme/Allergies:  Does not bruise/bleed easily.  Psychiatric/Behavioral:  Negative for depression. The patient is not nervous/anxious.     PAST MEDICAL HISTORY :  Past Medical History:  Diagnosis Date   (HFpEF) heart failure with preserved ejection fraction ([place])    a. 06/2020 Echo: EF 60-65%, no rwma, nl RV size/fxn. Triv MR. Triv TR/AI. Mod elev PASP.   Anxiety    COPD (chronic obstructive pulmonary disease) (HCC)    GERD (gastroesophageal reflux disease)    Hypertension    Hyperthyroidism    Hypokalemia    IgA myeloma (HCC)    Multiple myeloma (HCC)    Pneumonia    Red blood cell antibody positive, compatible PRBC difficult to obtain    S/P autologous bone marrow transplantation ([place])     PAST SURGICAL HISTORY :   Past Surgical History:  Procedure Laterality Date   ANTERIOR VITRECTOMY Left 10/07/2021   Procedure: ANTERIOR VITRECTOMY;  Surgeon: Leandrew Koyanagi, MD;  Location: [facility];  Service: Ophthalmology;  Laterality: Left;   CATARACT EXTRACTION W/PHACO Left 10/07/2021   Procedure: CATARACT EXTRACTION PHACO AND INTRAOCULAR LENS PLACEMENT ([place]) LEFT VISION BLUE;  Surgeon: Leandrew Koyanagi, MD;  Location: [facility];  Service: Ophthalmology;  Laterality: Left;  kit for manual incision 14.56 01:21.4   CATARACT EXTRACTION W/PHACO Right 10/21/2021   Procedure: CATARACT EXTRACTION PHACO AND INTRAOCULAR LENS PLACEMENT ([facility]) RIGHT 11.12 01:48.1;  Surgeon: Leandrew Koyanagi, MD;  Location: [facility];  Service: Ophthalmology;  Laterality: Right;   ESOPHAGOGASTRODUODENOSCOPY (EGD) WITH PROPOFOL N/A  03/30/2019   Procedure: ESOPHAGOGASTRODUODENOSCOPY (EGD) WITH PROPOFOL;  Surgeon: Jonathon Bellows, MD;  Location: [facility] ENDOSCOPY;  Service: Gastroenterology;  Laterality: N/A;   IR FLUORO GUIDE PORT INSERTION RIGHT  12/16/2017   IR IMAGING GUIDED PORT INSERTION  06/15/2021   THYROIDECTOMY N/A     FAMILY HISTORY :   Family History  Problem Relation Age of Onset   Pneumonia Mother    Seizures Father     SOCIAL HISTORY:   Social History   Tobacco Use   Smoking status: Former    Packs/day: 0.25    Types: Cigarettes    Quit date: 03/18/2021    Years since quitting: 1.2   Smokeless tobacco: Never   Tobacco comments:    2 cigarettes QOD  Vaping Use   Vaping Use: Never used  Substance Use Topics   Alcohol use: No   Drug use: No    ALLERGIES:  has No Known Allergies.  MEDICATIONS:  Current Outpatient Medications  Medication Sig Dispense Refill   acyclovir (ZOVIRAX) 400 MG tablet Take 1 tablet (400 mg total) by mouth 2 (two) times daily. 60 tablet 3   albuterol (VENTOLIN HFA) 108 (90 Base) MCG/ACT inhaler Inhale 2 puffs into the lungs every 6 (six) hours as needed for wheezing or shortness of breath. 8 g 3   aspirin EC 81 MG EC tablet Take 1 tablet (81 mg total) by mouth daily. 90 tablet 3   benzonatate (TESSALON) 100 MG capsule Take 1 capsule (100 mg total) by mouth 2 (two) times daily as needed for cough. 40 capsule 0   buPROPion (WELLBUTRIN) 75 MG tablet Take 75 mg by mouth daily.     calcium-vitamin D (OSCAL WITH D) 500MG-200UNIT (5MCG) tablet Take 1 tablet by mouth 2 (two) times daily. 60 tablet 0   carvedilol (COREG) 3.125 MG tablet Take 1 tablet (3.125 mg total)  by mouth 2 (two) times daily with a meal. 60 tablet 5   dexamethasone (DECADRON) 4 MG tablet Take dexamethasone 20 mg [5 pills] weekly on Thursdays. # Do not take dexamethasone in the week of chemotherapy. Take in the mornings 60 tablet 4   Ensure Plus (ENSURE PLUS) LIQD Take 237 mLs by mouth 3 (three) times a week.      furosemide (LASIX) 40 MG tablet Take 1 tablet (40 mg total) by mouth daily. 90 tablet 3   lenalidomide (REVLIMID) 15 MG capsule Take 1 capsule (15 mg total) by mouth daily. Take for 21 days, then hold for 7 days. Repeat every 28 days. 21 capsule 0   levothyroxine (SYNTHROID) 100 MCG tablet TAKE 1 TABLET BY MOUTH BEFORE BREAKFAST 90 tablet 0   lidocaine-prilocaine (EMLA) cream Apply 1 application topically as needed. Apply to port and cover with saran wrap 1-2 hours prior to port access 30 g 1   loperamide (IMODIUM) 2 MG capsule Take 2 mg by mouth as needed for diarrhea or loose stools.     magnesium oxide (MAG-OX) 400 (240 Mg) MG tablet Take 1 tablet (400 mg total) by mouth 2 (two) times daily. 60 tablet 3   montelukast (SINGULAIR) 10 MG tablet Take 1 tablet (10 mg total) by mouth at bedtime. 30 tablet 3   ondansetron (ZOFRAN) 8 MG tablet One pill every 8 hours as needed for nausea/vomitting. 40 tablet 1   pantoprazole (PROTONIX) 40 MG tablet Take 1 tablet (40 mg total) by mouth daily. 90 tablet 0   polyethylene glycol (MIRALAX / GLYCOLAX) packet Take 17 g by mouth daily as needed for mild constipation. 14 each 0   potassium chloride SA (KLOR-CON M) 20 MEQ tablet Take 1 tablet (20 mEq total) by mouth 2 (two) times daily. 60 tablet 6   spironolactone (ALDACTONE) 25 MG tablet Take 1 tablet by mouth once daily 90 tablet 3   traMADol (ULTRAM) 50 MG tablet TAKE 1 TABLET BY MOUTH EVERY 12 HOURS AS NEEDED 60 tablet 0   Vitamin D, Ergocalciferol, (DRISDOL) 1.25 MG (50000 UNIT) CAPS capsule Take 1 capsule by mouth once a week 12 capsule 0   No current facility-administered medications for this visit.   Facility-Administered Medications Ordered in Other Visits  Medication Dose Route Frequency Provider Last Rate Last Admin   0.9 %  sodium chloride infusion   Intravenous Continuous Monia Sabal, PA-C        PHYSICAL EXAMINATION: ECOG PERFORMANCE STATUS: 1 - Symptomatic but completely ambulatory  BP  132/82   Pulse (!) 50   Temp 98.6 F (37 C)   Resp 20   Wt 180 lb 6.4 oz (81.8 kg)   SpO2 100%   BMI 33.00 kg/m   Filed Weights   06/25/22 1024  Weight: 180 lb 6.4 oz (81.8 kg)     Physical Exam HENT:     Head: Normocephalic and atraumatic.  Eyes:     Pupils: Pupils are equal, round, and reactive to light.  Cardiovascular:     Rate and Rhythm: Normal rate and regular rhythm.  Pulmonary:     Effort: Pulmonary effort is normal. No respiratory distress.     Breath sounds: Normal breath sounds. No wheezing.  Abdominal:     General: Bowel sounds are normal. There is no distension.     Palpations: Abdomen is soft. There is no mass.     Tenderness: There is no abdominal tenderness. There is no guarding or rebound.  Musculoskeletal:  General: No tenderness. Normal range of motion.     Cervical back: Normal range of motion and neck supple.  Skin:    General: Skin is warm.  Neurological:     Mental Status: She is alert and oriented to person, place, and time.  Psychiatric:        Mood and Affect: Affect normal.    LABORATORY DATA:  I have reviewed the data as listed    Component Value Date/Time   NA 142 06/25/2022 0937   NA 141 01/25/2020 1530   K 3.0 (L) 06/25/2022 0937   CL 104 06/25/2022 0937   CO2 27 06/25/2022 0937   GLUCOSE 105 (H) 06/25/2022 0937   BUN 20 06/25/2022 0937   BUN 19 01/25/2020 1530   CREATININE 0.88 06/25/2022 0937   CALCIUM 9.1 06/25/2022 0937   PROT 7.3 06/25/2022 0937   ALBUMIN 3.8 06/25/2022 0937   AST 20 06/25/2022 0937   ALT 14 06/25/2022 0937   ALKPHOS 68 06/25/2022 0937   BILITOT 0.7 06/25/2022 0937   GFRNONAA >60 06/25/2022 0937   GFRAA NOT CALCULATED 04/17/2020 1352    No results found for: "SPEP", "UPEP"  Lab Results  Component Value Date   WBC 5.2 06/25/2022   NEUTROABS 2.3 06/25/2022   HGB 10.3 (L) 06/25/2022   HCT 32.6 (L) 06/25/2022   MCV 94.5 06/25/2022   PLT 201 06/25/2022      Chemistry      Component  Value Date/Time   NA 142 06/25/2022 0937   NA 141 01/25/2020 1530   K 3.0 (L) 06/25/2022 0937   CL 104 06/25/2022 0937   CO2 27 06/25/2022 0937   BUN 20 06/25/2022 0937   BUN 19 01/25/2020 1530   CREATININE 0.88 06/25/2022 0937      Component Value Date/Time   CALCIUM 9.1 06/25/2022 0937   ALKPHOS 68 06/25/2022 0937   AST 20 06/25/2022 0937   ALT 14 06/25/2022 0937   BILITOT 0.7 06/25/2022 0937      (H): Data is abnormally high   Latest Reference Range & Units Most Recent 12/24/20 14:01 04/01/21 14:34 04/28/21 09:17 06/11/21 09:42 07/03/21 10:32 08/20/21 10:06  M Protein SerPl Elph-Mcnc Not Observed g/dL 1.1 (H) (C) 08/20/21 10:06 Not Observed (C) 0.6 (H) (C) 0.7 (H) (C) 0.5 (H) (C) 0.7 (H) (C) 1.1 (H) (C)    Latest Reference Range & Units Most Recent 09/17/21 08:47 10/15/21 08:25 11/12/21 08:55  Kappa free light chain 3.3 - 19.4 mg/L 5.0 11/12/21 08:55 7.2 7.2 5.0  Lambda free light chains 5.7 - 26.3 mg/L 223.6 (H) 11/12/21 08:55 215.3 (H) 340.2 (H) 223.6 (H)  Kappa, lambda light chain ratio 0.26 - 1.65  0.02 (L) 11/12/21 08:55 0.03 (L) 0.02 (L) 0.02 (L)  (H): Data is abnormally high (L): Data is abnormally low  RADIOGRAPHIC STUDIES: I have personally reviewed the radiological images as listed and agreed with the findings in the report. No results found.   ASSESSMENT & PLAN:  Multiple myeloma not having achieved remission ([place]) # RECURRENT Multiple myeloma stage III [high-risk cytogenetics-status post KRD-VGPR ; status post autologous stem cell transplant on 06/15/18.  Currently recurrent-on salvage therapy.Patient currently on salvage therapy with daratumumab  lenalidomide [15 mg 3 w; 1-w] dexamethasone.    # Continue cycle- Dara SQ  continue REVLIMID 15 mg again. 3 weeks/1 week off; continue. Labs today reviewed;  acceptable for treatment today. SEP   2023- 0.2gm; SEP 2023-lamda LC= 240.overall slight worse. Monitor for now.  Continue weekly dex 20 mg/Thursdays. Overall  STABLE-clinically.  # Iatrogenic hypothyroidism [ Graves/goiter s/p total thyroidectomy;aug,2022 ]-  April 2023- TSH-LOW; decrease the dose of Synthroid to 100 mcg/day- AUG 2023- TSH- WNL.STABLE.  # cough- dry [await Bone survey]- ordered Tessalon Perles.  # Worsening lower back discomfort x 6 months- check bone survey.  If negative would recommend evaluation with MRI/orthopedics.  # Muscle cramps-?  Revlimid. improved  On tramadol q 12 hours prn-Refill tramadol.STABLE    # Chronic diastolic CHF-clinically- STABLE  # Hypokalemia- K 3.0- recommend compliance with Kdur BID.   # Bone lesions /last zometa on 11/27/2017.  Holding Zometa secondary to poor dentition.  Awaiting dental evaluation in AUG, 2023; will need hematology clearance prior patient dental extraction.appt- awaiting evaluation appt on OCT 2nd 2023- STABLE  # Insomnia- recommend restart melatonin 1 to 2 mg nightly as needed.  Would not recommend anxiolytic or sleep medication.  # IV access: mediport- functional  :MM panel; K/l light chain ratio q 4 W  # Vaccination: Flu shot today; recommend COVID-  # DISPOSITION:  # bone survey #  Dara today # # flu shot today #  Follow up in 4  weeks- MD;Dara SQ; 'port- labs-cbc/cmp; MM panel; K/l light chain.  Dr.B      Orders Placed This Encounter  Procedures   DG Bone Survey Met    Standing Status:   Future    Standing Expiration Date:   06/26/2023    Order Specific Question:   Reason for Exam (SYMPTOM  OR DIAGNOSIS REQUIRED)    Answer:   multiple myeloma    Order Specific Question:   Preferred imaging location?    Answer:   [facility]   Comprehensive metabolic panel    Standing Status:   Future    Standing Expiration Date:   07/24/2023   CBC with Differential    Standing Status:   Future    Standing Expiration Date:   07/24/2023   Comprehensive metabolic panel    Standing Status:   Future    Standing Expiration Date:   07/23/2023   Multiple Myeloma Panel  (SPEP&IFE w/QIG)    Standing Status:   Future    Standing Expiration Date:   06/25/2023   Kappa/lambda light chains    Standing Status:   Future    Standing Expiration Date:   06/25/2023     All questions were answered. The patient knows to call the clinic with any problems, questions or concerns.      [name], MD 06/25/2022 12:29 PM

## 2022-06-25 NOTE — Patient Instructions (Signed)
[facility] CTR AT [place]  Discharge Instructions: Thank you for choosing [facility] to provide your oncology and hematology care.  If you have a lab appointment with the [facility], please go directly to the [facility] and check in at the registration area.  Wear comfortable clothing and clothing appropriate for easy access to any Portacath or PICC line.   We strive to give you quality time with your provider. You may need to reschedule your appointment if you arrive late (15 or more minutes).  Arriving late affects you and other patients whose appointments are after yours.  Also, if you miss three or more appointments without notifying the office, you may be dismissed from the clinic at the provider's discretion.      For prescription refill requests, have your pharmacy contact our office and allow 72 hours for refills to be completed.    Today you received the following chemotherapy and/or immunotherapy agents- darzalex faspro      To help prevent nausea and vomiting after your treatment, we encourage you to take your nausea medication as directed.  BELOW ARE SYMPTOMS THAT SHOULD BE REPORTED IMMEDIATELY: *FEVER GREATER THAN 100.4 F (38 C) OR HIGHER *CHILLS OR SWEATING *NAUSEA AND VOMITING THAT IS NOT CONTROLLED WITH YOUR NAUSEA MEDICATION *UNUSUAL SHORTNESS OF BREATH *UNUSUAL BRUISING OR BLEEDING *URINARY PROBLEMS (pain or burning when urinating, or frequent urination) *BOWEL PROBLEMS (unusual diarrhea, constipation, pain near the anus) TENDERNESS IN MOUTH AND THROAT WITH OR WITHOUT PRESENCE OF ULCERS (sore throat, sores in mouth, or a toothache) UNUSUAL RASH, SWELLING OR PAIN  UNUSUAL VAGINAL DISCHARGE OR ITCHING   Items with * indicate a potential emergency and should be followed up as soon as possible or go to the Emergency Department if any problems should occur.  Please show the CHEMOTHERAPY ALERT CARD or IMMUNOTHERAPY ALERT CARD at  check-in to the Emergency Department and triage nurse.  Should you have questions after your visit or need to cancel or reschedule your appointment, please contact [facility] CANCER [place] AT [place]  [phone] and follow the prompts.  Office hours are 8:00 a.m. to 4:30 p.m. Monday - Friday. Please note that voicemails left after 4:00 p.m. may not be returned until the following business day.  We are closed weekends and major holidays. You have access to a nurse at all times for urgent questions. Please call the main number to the clinic [phone] and follow the prompts.  For any non-urgent questions, you may also contact your provider using MyChart. We now offer e-Visits for anyone 33 and older to request care online for non-urgent symptoms. For details visit [email or web address].   Also download the MyChart app! Go to the app store, search "MyChart", open the app, select [facility], and log in with your MyChart username and password.  Masks are optional in the cancer centers. If you would like for your care team to wear a mask while they are taking care of you, please let them know. For doctor visits, patients may have with them one support person who is at least 70 years old. At this time, visitors are not allowed in the infusion area.

## 2022-06-25 NOTE — Assessment & Plan Note (Addendum)
#  RECURRENT Multiple myeloma stage III [high-risk cytogenetics-status post KRD-VGPR ; status post autologous stem cell transplant on 06/15/18.  Currently recurrent-on salvage therapy.Patient currently on salvage therapy with daratumumab  lenalidomide [15 mg 3 w; 1-w] dexamethasone.    # Continue cycle- Dara SQ  continue REVLIMID 15 mg again. 3 weeks/1 week off; continue. Labs today reviewed;  acceptable for treatment today. SEP   2023- 0.2gm; SEP 2023-lamda LC= 240.overall slight worse. Monitor for now.   Continue weekly dex 20 mg/Thursdays. Overall STABLE-clinically.  # Iatrogenic hypothyroidism [ Graves/goiter s/p total thyroidectomy;aug,2022 ]-  April 2023- TSH-LOW; decrease the dose of Synthroid to 100 mcg/day- AUG 2023- TSH- WNL.STABLE.  # cough- dry [await Bone survey]- ordered Tessalon Perles.  # Worsening lower back discomfort x 6 months- check bone survey.  If negative would recommend evaluation with MRI/orthopedics.  # Muscle cramps-?  Revlimid. improved  On tramadol q 12 hours prn-Refill tramadol.STABLE    # Chronic diastolic CHF-clinically- STABLE  # Hypokalemia- K 3.0- recommend compliance with Kdur BID.   # Bone lesions /last zometa on 11/27/2017.  Holding Zometa secondary to poor dentition.  Awaiting dental evaluation in AUG, 2023; will need hematology clearance prior patient dental extraction.appt- awaiting evaluation appt on OCT 2nd 2023- STABLE  # Insomnia- recommend restart melatonin 1 to 2 mg nightly as needed.  Would not recommend anxiolytic or sleep medication.  # IV access: mediport- functional  :MM panel; K/l light chain ratio q 4 W  # Vaccination: Flu shot today; recommend COVID-  # DISPOSITION:  # bone survey #  Dara today # # flu shot today #  Follow up in 4  weeks- MD;Dara SQ; 'port- labs-cbc/cmp; MM panel; K/l light chain.  Dr.B

## 2022-06-25 NOTE — Progress Notes (Signed)
Patient states she has been having trouble with her back and is also having some trouble sleeping.  Patient states she has a real bad cough and is requesting a prescription for cough medicine.

## 2022-06-26 ENCOUNTER — Other Ambulatory Visit: Payer: Self-pay

## 2022-06-28 ENCOUNTER — Other Ambulatory Visit: Payer: Self-pay

## 2022-06-28 LAB — KAPPA/LAMBDA LIGHT CHAINS
Kappa free light chain: 4.2 mg/L (ref 3.3–19.4)
Kappa, lambda light chain ratio: 0.02 — ABNORMAL LOW (ref 0.26–1.65)
Lambda free light chains: 226.7 mg/L — ABNORMAL HIGH (ref 5.7–26.3)

## 2022-06-29 LAB — MULTIPLE MYELOMA PANEL, SERUM
Albumin SerPl Elph-Mcnc: 3.6 g/dL (ref 2.9–4.4)
Albumin/Glob SerPl: 1.2 (ref 0.7–1.7)
Alpha 1: 0.1 g/dL (ref 0.0–0.4)
Alpha2 Glob SerPl Elph-Mcnc: 0.7 g/dL (ref 0.4–1.0)
B-Globulin SerPl Elph-Mcnc: 1.3 g/dL (ref 0.7–1.3)
Gamma Glob SerPl Elph-Mcnc: 1.1 g/dL (ref 0.4–1.8)
Globulin, Total: 3.2 g/dL (ref 2.2–3.9)
IgA: 647 mg/dL — ABNORMAL HIGH (ref 87–352)
IgG (Immunoglobin G), Serum: 391 mg/dL — ABNORMAL LOW (ref 586–1602)
IgM (Immunoglobulin M), Srm: 6 mg/dL — ABNORMAL LOW (ref 26–217)
M Protein SerPl Elph-Mcnc: 0.7 g/dL — ABNORMAL HIGH
Total Protein ELP: 6.8 g/dL (ref 6.0–8.5)

## 2022-07-09 ENCOUNTER — Other Ambulatory Visit: Payer: Self-pay | Admitting: *Deleted

## 2022-07-09 DIAGNOSIS — C9002 Multiple myeloma in relapse: Secondary | ICD-10-CM

## 2022-07-09 MED ORDER — LENALIDOMIDE 15 MG PO CAPS: 15.0000 mg | ORAL_CAPSULE | Freq: Every day | ORAL | 0 refills | Status: DC

## 2022-07-11 ENCOUNTER — Other Ambulatory Visit: Payer: Self-pay | Admitting: Internal Medicine

## 2022-07-16 ENCOUNTER — Other Ambulatory Visit: Payer: Self-pay

## 2022-07-23 ENCOUNTER — Inpatient Hospital Stay (HOSPITAL_BASED_OUTPATIENT_CLINIC_OR_DEPARTMENT_OTHER): Payer: Medicare Other | Admitting: Internal Medicine

## 2022-07-23 ENCOUNTER — Inpatient Hospital Stay: Payer: Medicare Other

## 2022-07-23 ENCOUNTER — Encounter: Payer: Self-pay | Admitting: Internal Medicine

## 2022-07-23 VITALS — BP 140/86 | HR 77 | Temp 97.9°F | Resp 20 | Wt 178.5 lb

## 2022-07-23 DIAGNOSIS — Z5112 Encounter for antineoplastic immunotherapy: Secondary | ICD-10-CM | POA: Diagnosis not present

## 2022-07-23 DIAGNOSIS — C9000 Multiple myeloma not having achieved remission: Secondary | ICD-10-CM | POA: Diagnosis not present

## 2022-07-23 DIAGNOSIS — Z95828 Presence of other vascular implants and grafts: Secondary | ICD-10-CM

## 2022-07-23 LAB — COMPREHENSIVE METABOLIC PANEL
ALT: 13 U/L (ref 0–44)
AST: 19 U/L (ref 15–41)
Albumin: 3.9 g/dL (ref 3.5–5.0)
Alkaline Phosphatase: 63 U/L (ref 38–126)
Anion gap: 10 (ref 5–15)
BUN: 15 mg/dL (ref 8–23)
CO2: 23 mmol/L (ref 22–32)
Calcium: 8.5 mg/dL — ABNORMAL LOW (ref 8.9–10.3)
Chloride: 107 mmol/L (ref 98–111)
Creatinine, Ser: 0.86 mg/dL (ref 0.44–1.00)
GFR, Estimated: >60 mL/min (ref >60)
Glucose, Bld: 109 mg/dL — ABNORMAL HIGH (ref 70–99)
Potassium: 3.2 mmol/L — ABNORMAL LOW (ref 3.5–5.1)
Sodium: 140 mmol/L (ref 135–145)
Total Bilirubin: 0.5 mg/dL (ref 0.3–1.2)
Total Protein: 8.2 g/dL — ABNORMAL HIGH (ref 6.5–8.1)

## 2022-07-23 LAB — CBC WITH DIFFERENTIAL/PLATELET
Abs Immature Granulocytes: 0.02 10*3/uL (ref 0.00–0.07)
Basophils Absolute: 0 10*3/uL (ref 0.0–0.1)
Basophils Relative: 1 %
Eosinophils Absolute: 0.2 10*3/uL (ref 0.0–0.5)
Eosinophils Relative: 3 %
HCT: 32.1 % — ABNORMAL LOW (ref 36.0–46.0)
Hemoglobin: 10.5 g/dL — ABNORMAL LOW (ref 12.0–15.0)
Immature Granulocytes: 0 %
Lymphocytes Relative: 50 %
Lymphs Abs: 2.8 10*3/uL (ref 0.7–4.0)
MCH: 30.7 pg (ref 26.0–34.0)
MCHC: 32.7 g/dL (ref 30.0–36.0)
MCV: 93.9 fL (ref 80.0–100.0)
Monocytes Absolute: 0.3 10*3/uL (ref 0.1–1.0)
Monocytes Relative: 5 %
Neutro Abs: 2.3 10*3/uL (ref 1.7–7.7)
Neutrophils Relative %: 41 %
Platelets: 201 10*3/uL (ref 150–400)
RBC: 3.42 MIL/uL — ABNORMAL LOW (ref 3.87–5.11)
RDW: 16.8 % — ABNORMAL HIGH (ref 11.5–15.5)
WBC: 5.7 10*3/uL (ref 4.0–10.5)
nRBC: 0 % (ref 0.0–0.2)

## 2022-07-23 MED ORDER — ACETAMINOPHEN 325 MG PO TABS
650.0000 mg | ORAL_TABLET | Freq: Once | ORAL | Status: AC
  Administered 2022-07-23: 650 mg via ORAL
  Filled 2022-07-23: qty 2

## 2022-07-23 MED ORDER — HEPARIN SOD (PORK) LOCK FLUSH 100 UNIT/ML IV SOLN
500.0000 [IU] | Freq: Once | INTRAVENOUS | Status: AC
  Administered 2022-07-23: 500 [IU] via INTRAVENOUS
  Filled 2022-07-23: qty 5

## 2022-07-23 MED ORDER — MONTELUKAST SODIUM 10 MG PO TABS
10.0000 mg | ORAL_TABLET | Freq: Once | ORAL | Status: AC
  Administered 2022-07-23: 10 mg via ORAL
  Filled 2022-07-23: qty 1

## 2022-07-23 MED ORDER — DARATUMUMAB-HYALURONIDASE-FIHJ 1800-30000 MG-UT/15ML ~~LOC~~ SOLN
1800.0000 mg | Freq: Once | SUBCUTANEOUS | Status: AC
  Administered 2022-07-23: 1800 mg via SUBCUTANEOUS
  Filled 2022-07-23: qty 15

## 2022-07-23 MED ORDER — DIPHENHYDRAMINE HCL 25 MG PO CAPS
50.0000 mg | ORAL_CAPSULE | Freq: Once | ORAL | Status: AC
  Administered 2022-07-23: 50 mg via ORAL
  Filled 2022-07-23: qty 2

## 2022-07-23 MED ORDER — DEXAMETHASONE 4 MG PO TABS
20.0000 mg | ORAL_TABLET | Freq: Once | ORAL | Status: AC
  Administered 2022-07-23: 20 mg via ORAL
  Filled 2022-07-23: qty 5

## 2022-07-23 NOTE — Progress Notes (Signed)
[facility] OFFICE PROGRESS NOTE  Patient Care Team: Marguerita Merles, MD as PCP - General (Family Medicine) End, Harrell Gave, MD as PCP - Cardiology (Cardiology) [name], [name] [name], MD as Consulting Physician (Hematology and Oncology) Ottie Glazier, MD as Consulting Physician (Pulmonary Disease) Gabriel Carina Betsey Holiday, MD as Consulting Physician (Endocrinology)   Cancer Staging  No matching staging information was found for the patient.   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 No morphologic support for residual myeloma disease. Negative for minimal residual disease by MM-MRD flow cytometry; HOLD REVLIMID [leg swelling]; AUG 2021-PET scan negative for myeloma; continue to hold Revlimid  # MARCH 2021-diastolic congestive heart failure [Dr.End]  # BMBx- OCT 2021- BMBx- 5% plasmacytosis-however this appears to be more polyclonal rather than monoclonal-not explain patient worsening anemia.   # OCT 2022- 18th- Dara- Rev-Dex  #November 2021-hyperthyroidism/goiter-  [D.Bennett/Dr.Solum]-methimazole. S/p Thyroidectomy [Dr.Kim; [facility] 23818] --------------------------  # 12/12- RIGHT JUGULAR DVT-x 359mn xarelto; finished April 2020; September 2020-EGD/dysphagia; Dr. AnVicente Males# Acute renal failure [Dr.Singh; Proteinuria 1.5gm/day ]; acyclovir/Asprin ------------------------------------------------------------------------------------------------------------   DIAGNOSIS: _0  MULTIPLE MYELOMA  STAGE: III/HIGH RISK ;GOALS: CONTROL      Multiple myeloma not having achieved remission ([facility] 11/21/2017 - 04/28/2018 Chemotherapy   Patient is on Treatment Plan : MYELOMA SALVAGE Cyclophosphamide / Carfilzomib / Dexamethasone (CCd) q28d     05/12/2021 - 02/25/2022 Chemotherapy   Patient is on Treatment Plan : MYELOMA RELAPSED REFRACTORY Daratumumab SQ + Lenalidomide + Dexamethasone (DaraRd) q28d     05/12/2021 -  Chemotherapy   Patient is on Treatment Plan : MYELOMA RELAPSED REFRACTORY Daratumumab SQ + Lenalidomide + Dexamethasone (DaraRd) q28d      INTERVAL HISTORY: Ambulating independently.  Alone.  [name]  female pleasant patient above history of relapsed multiple myeloma--currently on daratumumab-Revlimid-dexamethasone  is here for follow-up.  Patient states she is having trouble with her lower back when she stands up.   Continues to complain of intermittent cramping/muscle spasm.  Chronic mild muscle spasms needing tramadol every 12 hours also.  Denies any falls.  Denies any incontinence.  Review of Systems  Constitutional:  Positive for malaise/fatigue. Negative for chills, diaphoresis and fever.  HENT:  Negative for nosebleeds and sore throat.   Eyes:  Negative for double vision.  Respiratory:  Negative for hemoptysis.   Cardiovascular:  Negative for chest pain, palpitations and orthopnea.  Gastrointestinal:  Negative for abdominal pain, blood in stool, constipation, heartburn and melena.  Genitourinary:  Negative for dysuria,  frequency and urgency.  Musculoskeletal:  Positive for back pain.  Skin: Negative.  Negative for itching and rash.  Neurological:  Negative for dizziness, tingling, focal weakness, weakness and headaches.  Endo/Heme/Allergies:  Does not bruise/bleed easily.  Psychiatric/Behavioral:  Negative for depression. The patient is not nervous/anxious.     PAST MEDICAL HISTORY :  Past Medical History:  Diagnosis Date   (HFpEF) heart failure with preserved ejection fraction ([place])    a. 06/2020 Echo: EF 60-65%, no rwma, nl RV size/fxn. Triv MR. Triv TR/AI. Mod elev PASP.   Anxiety    COPD (chronic obstructive pulmonary disease) (HCC)    GERD (gastroesophageal reflux disease)    Hypertension    Hyperthyroidism    Hypokalemia    IgA myeloma (HCC)    Multiple myeloma (HCC)    Pneumonia    Red blood cell antibody positive, compatible PRBC difficult to obtain    S/P autologous bone marrow transplantation ([place])     PAST SURGICAL HISTORY :   Past Surgical History:  Procedure Laterality Date   ANTERIOR VITRECTOMY Left 10/07/2021   Procedure: ANTERIOR VITRECTOMY;  Surgeon: Leandrew Koyanagi, MD;  Location: [facility];  Service: Ophthalmology;  Laterality: Left;   CATARACT EXTRACTION W/PHACO Left 10/07/2021   Procedure: CATARACT EXTRACTION PHACO AND INTRAOCULAR LENS PLACEMENT ([place]) LEFT VISION BLUE;  Surgeon: Leandrew Koyanagi, MD;  Location: [facility];  Service: Ophthalmology;  Laterality: Left;  kit for manual incision 14.56 01:21.4   CATARACT EXTRACTION W/PHACO Right 10/21/2021   Procedure: CATARACT EXTRACTION PHACO AND INTRAOCULAR LENS PLACEMENT ([facility]) RIGHT 11.12 01:48.1;  Surgeon: Leandrew Koyanagi, MD;  Location: [facility];  Service: Ophthalmology;  Laterality: Right;   ESOPHAGOGASTRODUODENOSCOPY (EGD) WITH PROPOFOL N/A 03/30/2019   Procedure: ESOPHAGOGASTRODUODENOSCOPY (EGD) WITH PROPOFOL;  Surgeon: Jonathon Bellows, MD;  Location: [facility]COPY;  Service:  Gastroenterology;  Laterality: N/A;   IR FLUORO GUIDE PORT INSERTION RIGHT  12/16/2017   IR IMAGING GUIDED PORT INSERTION  06/15/2021   THYROIDECTOMY N/A     FAMILY HISTORY :   Family History  Problem Relation Age of Onset   Pneumonia Mother    Seizures Father     SOCIAL HISTORY:   Social History   Tobacco Use   Smoking status: Former    Packs/day: 0.25    Types: Cigarettes    Quit date: 03/18/2021    Years since quitting: 1.3   Smokeless tobacco: Never   Tobacco comments:    2 cigarettes QOD  Vaping Use   Vaping Use: Never used  Substance Use Topics   Alcohol use: No   Drug use: No    ALLERGIES:  has No Known Allergies.  MEDICATIONS:  Current Outpatient Medications  Medication Sig Dispense Refill   acyclovir (ZOVIRAX) 400 MG tablet Take 1 tablet (400 mg total) by mouth 2 (two) times daily. 60 tablet 3   albuterol (VENTOLIN HFA) 108 (90 Base) MCG/ACT inhaler Inhale 2 puffs into the lungs every 6 (six) hours as needed for wheezing or shortness of breath. 8 g 3   aspirin EC 81 MG EC tablet Take 1 tablet (81 mg total) by mouth daily. 90 tablet 3   benzonatate (TESSALON) 100 MG capsule Take 1 capsule (100 mg total) by mouth 2 (two) times daily as needed for cough. 40 capsule 0   buPROPion (WELLBUTRIN) 75 MG tablet Take 75 mg by mouth daily.     calcium-vitamin D (OSCAL WITH D) 500MG-200UNIT (5MCG) tablet Take 1 tablet by mouth 2 (two) times daily. 60 tablet 0   carvedilol (COREG) 3.125 MG tablet Take 1 tablet (3.125 mg total) by mouth 2 (two) times daily with a meal. 60 tablet 5   dexamethasone (DECADRON) 4  MG tablet Take dexamethasone 20 mg [5 pills] weekly on Thursdays. # Do not take dexamethasone in the week of chemotherapy. Take in the mornings 60 tablet 4   Ensure Plus (ENSURE PLUS) LIQD Take 237 mLs by mouth 3 (three) times a week.     furosemide (LASIX) 40 MG tablet Take 1 tablet (40 mg total) by mouth daily. 90 tablet 3   lenalidomide (REVLIMID) 15 MG capsule Take 1  capsule (15 mg total) by mouth daily. Take for 21 days, then hold for 7 days. Repeat every 28 days. 21 capsule 0   levothyroxine (SYNTHROID) 100 MCG tablet TAKE 1 TABLET BY MOUTH BEFORE BREAKFAST 90 tablet 0   lidocaine-prilocaine (EMLA) cream Apply 1 application topically as needed. Apply to port and cover with saran wrap 1-2 hours prior to port access 30 g 1   loperamide (IMODIUM) 2 MG capsule Take 2 mg by mouth as needed for diarrhea or loose stools.     magnesium oxide (MAG-OX) 400 (240 Mg) MG tablet Take 1 tablet (400 mg total) by mouth 2 (two) times daily. 60 tablet 3   montelukast (SINGULAIR) 10 MG tablet Take 1 tablet (10 mg total) by mouth at bedtime. 30 tablet 3   ondansetron (ZOFRAN) 8 MG tablet One pill every 8 hours as needed for nausea/vomitting. 40 tablet 1   pantoprazole (PROTONIX) 40 MG tablet Take 1 tablet (40 mg total) by mouth daily. 90 tablet 0   polyethylene glycol (MIRALAX / GLYCOLAX) packet Take 17 g by mouth daily as needed for mild constipation. 14 each 0   potassium chloride SA (KLOR-CON M) 20 MEQ tablet Take 1 tablet (20 mEq total) by mouth 2 (two) times daily. 60 tablet 6   spironolactone (ALDACTONE) 25 MG tablet Take 1 tablet by mouth once daily 90 tablet 3   traMADol (ULTRAM) 50 MG tablet TAKE 1 TABLET BY MOUTH EVERY 12 HOURS AS NEEDED 60 tablet 0   Vitamin D, Ergocalciferol, (DRISDOL) 1.25 MG (50000 UNIT) CAPS capsule Take 1 capsule by mouth once a week 12 capsule 0   No current facility-administered medications for this visit.   Facility-Administered Medications Ordered in Other Visits  Medication Dose Route Frequency Provider Last Rate Last Admin   0.9 %  sodium chloride infusion   Intravenous Continuous Monia Sabal, PA-C        PHYSICAL EXAMINATION: ECOG PERFORMANCE STATUS: 1 - Symptomatic but completely ambulatory  BP (!) 140/86   Pulse 77   Temp 97.9 F (36.6 C)   Resp 20   Wt 178 lb 8 oz (81 kg)   SpO2 99%   BMI 32.65 kg/m   Filed Weights    07/23/22 1038  Weight: 178 lb 8 oz (81 kg)     Physical Exam HENT:     Head: Normocephalic and atraumatic.  Eyes:     Pupils: Pupils are equal, round, and reactive to light.  Cardiovascular:     Rate and Rhythm: Normal rate and regular rhythm.  Pulmonary:     Effort: Pulmonary effort is normal. No respiratory distress.     Breath sounds: Normal breath sounds. No wheezing.  Abdominal:     General: Bowel sounds are normal. There is no distension.     Palpations: Abdomen is soft. There is no mass.     Tenderness: There is no abdominal tenderness. There is no guarding or rebound.  Musculoskeletal:        General: No tenderness. Normal range of motion.  Cervical back: Normal range of motion and neck supple.  Skin:    General: Skin is warm.  Neurological:     Mental Status: She is alert and oriented to person, place, and time.  Psychiatric:        Mood and Affect: Affect normal.    LABORATORY DATA:  I have reviewed the data as listed    Component Value Date/Time   NA 140 07/23/2022 1021   NA 141 01/25/2020 1530   K 3.2 (L) 07/23/2022 1021   CL 107 07/23/2022 1021   CO2 23 07/23/2022 1021   GLUCOSE 109 (H) 07/23/2022 1021   BUN 15 07/23/2022 1021   BUN 19 01/25/2020 1530   CREATININE 0.86 07/23/2022 1021   CALCIUM 8.5 (L) 07/23/2022 1021   PROT 8.2 (H) 07/23/2022 1021   ALBUMIN 3.9 07/23/2022 1021   AST 19 07/23/2022 1021   ALT 13 07/23/2022 1021   ALKPHOS 63 07/23/2022 1021   BILITOT 0.5 07/23/2022 1021   GFRNONAA >60 07/23/2022 1021   GFRAA NOT CALCULATED 04/17/2020 1352    No results found for: "SPEP", "UPEP"  Lab Results  Component Value Date   WBC 5.7 07/23/2022   NEUTROABS 2.3 07/23/2022   HGB 10.5 (L) 07/23/2022   HCT 32.1 (L) 07/23/2022   MCV 93.9 07/23/2022   PLT 201 07/23/2022      Chemistry      Component Value Date/Time   NA 140 07/23/2022 1021   NA 141 01/25/2020 1530   K 3.2 (L) 07/23/2022 1021   CL 107 07/23/2022 1021   CO2 23  07/23/2022 1021   BUN 15 07/23/2022 1021   BUN 19 01/25/2020 1530   CREATININE 0.86 07/23/2022 1021      Component Value Date/Time   CALCIUM 8.5 (L) 07/23/2022 1021   ALKPHOS 63 07/23/2022 1021   AST 19 07/23/2022 1021   ALT 13 07/23/2022 1021   BILITOT 0.5 07/23/2022 1021      (H): Data is abnormally high   Latest Reference Range & Units Most Recent 12/24/20 14:01 04/01/21 14:34 04/28/21 09:17 06/11/21 09:42 07/03/21 10:32 08/20/21 10:06  M Protein SerPl Elph-Mcnc Not Observed g/dL 1.1 (H) (C) 08/20/21 10:06 Not Observed (C) 0.6 (H) (C) 0.7 (H) (C) 0.5 (H) (C) 0.7 (H) (C) 1.1 (H) (C)    Latest Reference Range & Units Most Recent 09/17/21 08:47 10/15/21 08:25 11/12/21 08:55  Kappa free light chain 3.3 - 19.4 mg/L 5.0 11/12/21 08:55 7.2 7.2 5.0  Lambda free light chains 5.7 - 26.3 mg/L 223.6 (H) 11/12/21 08:55 215.3 (H) 340.2 (H) 223.6 (H)  Kappa, lambda light chain ratio 0.26 - 1.65  0.02 (L) 11/12/21 08:55 0.03 (L) 0.02 (L) 0.02 (L)  (H): Data is abnormally high (L): Data is abnormally low  RADIOGRAPHIC STUDIES: I have personally reviewed the radiological images as listed and agreed with the findings in the report. No results found.   ASSESSMENT & PLAN:  Multiple myeloma not having achieved remission ([place]) # RECURRENT Multiple myeloma stage III [high-risk cytogenetics-status post KRD-VGPR ; status post autologous stem cell transplant on 06/15/18.  Currently recurrent-on salvage therapy.Patient currently on salvage therapy with daratumumab  lenalidomide [15 mg 3 w; 1-w] dexamethasone.    # Continue cycle- Dara SQ  continue REVLIMID 15 mg again. 3 weeks/1 week off; continue. Labs today reviewed;  acceptable for treatment today. NOV  2023- 0.2gm; NOV 2023-lamda LC= 240/[facility]- however- Monitor for now.   Continue weekly dex 20 mg/Thursdays.   # Iatrogenic  hypothyroidism [ Graves/goiter s/p total thyroidectomy;aug,2022 ]-  April 2023- TSH-LOW; decrease the dose of Synthroid to 100  mcg/day- AUG 2023- TSH- WNL.STABLE.  # Worsening lower back discomfort x 6 months- check bone survey.  If negative would recommend evaluation with MRI/orthopedics. Reminded pt to get the get imaging done ASAP.  # Muscle cramps-?  Revlimid. improved  On tramadol q 12 hours prn-Refill tramadol.STABLE    # Chronic diastolic CHF-clinically- STABLE  # Hypokalemia- K 3.2 recommend compliance with Kdur BID. STABLE  # Bone lesions /last zometa on 11/27/2017.  Holding Zometa secondary to poor dentition.  Dental extraction appointment appt-on Jan 26th.   # Insomnia- recommend restart melatonin 1 to 2 mg nightly as needed.   STABLE.   # IV access: mediport- functional  :MM panel; K/l light chain ratio q 4 W  # Vaccination: Flu shot s/p;  recommend COVID-  6 w- given dental extrac- on 1/26.   # DISPOSITION:  # bone survey as ordered- please remind pt.  #  Dara today #  Follow up in 6 weeks- MD;Dara SQ; 'port- labs-cbc/cmp; MM panel; K/l light chain.  Dr.B      Orders Placed This Encounter  Procedures   Comprehensive metabolic panel    Standing Status:   Future    Standing Expiration Date:   09/04/2023   CBC with Differential    Standing Status:   Future    Standing Expiration Date:   09/04/2023   Comprehensive metabolic panel    Standing Status:   Future    Standing Expiration Date:   09/03/2023   Kappa/lambda light chains    Standing Status:   Future    Standing Expiration Date:   07/24/2023   Multiple Myeloma Panel (SPEP&IFE w/QIG)    Standing Status:   Future    Standing Expiration Date:   07/24/2023     All questions were answered. The patient knows to call the clinic with any problems, questions or concerns.      [name], MD 07/23/2022 11:20 AM

## 2022-07-23 NOTE — Progress Notes (Signed)
Patient states she is having trouble with her lower back when she stands up.

## 2022-07-23 NOTE — Assessment & Plan Note (Addendum)
#  RECURRENT Multiple myeloma stage III [high-risk cytogenetics-status post KRD-VGPR ; status post autologous stem cell transplant on 06/15/18.  Currently recurrent-on salvage therapy.Patient currently on salvage therapy with daratumumab  lenalidomide [15 mg 3 w; 1-w] dexamethasone.    # Continue cycle- Dara SQ  continue REVLIMID 15 mg again. 3 weeks/1 week off; continue. Labs today reviewed;  acceptable for treatment today. NOV  2023- 0.2gm; NOV 2023-lamda LC= 240/[facility]- however- Monitor for now.   Continue weekly dex 20 mg/Thursdays.  Reviewed with the patient that her malignancy is incurable/terminal.  However patient has been living with her malignancy for 5 years.  And given the multiple lines of option down the line-life expectancy would be in the order of years.  # Iatrogenic hypothyroidism [ Graves/goiter s/p total thyroidectomy;aug,2022 ]-  April 2023- TSH-LOW; decrease the dose of Synthroid to 100 mcg/day- AUG 2023- TSH- WNL.STABLE.  # Worsening lower back discomfort x 6 months- check bone survey.  If negative would recommend evaluation with MRI/orthopedics. Reminded pt to get the get imaging done ASAP.  # Muscle cramps-?  Revlimid. improved  On tramadol q 12 hours prn-Refill tramadol.STABLE    # Chronic diastolic CHF-clinically- STABLE  # Hypokalemia- K 3.2 recommend compliance with Kdur BID. STABLE  # Bone lesions /last zometa on 11/27/2017.  Holding Zometa secondary to poor dentition.  Dental extraction appointment appt-on Jan 26th.   # Insomnia- recommend restart melatonin 1 to 2 mg nightly as needed.   STABLE.   # IV access: mediport- functional  :MM panel; K/l light chain ratio q 4 W  # Vaccination: Flu shot s/p;  recommend COVID-  6 w- given dental extrac- on 1/26.   # DISPOSITION:  # bone survey as ordered- please remind pt.  #  Dara today #  Follow up in 6 weeks- MD;Dara SQ; 'port- labs-cbc/cmp; MM panel; K/l light chain.  Dr.B

## 2022-07-24 ENCOUNTER — Other Ambulatory Visit: Payer: Self-pay

## 2022-07-26 ENCOUNTER — Other Ambulatory Visit: Payer: Self-pay

## 2022-07-27 LAB — KAPPA/LAMBDA LIGHT CHAINS
Kappa free light chain: 4.5 mg/L (ref 3.3–19.4)
Kappa, lambda light chain ratio: 0.02 — ABNORMAL LOW (ref 0.26–1.65)
Lambda free light chains: 256.2 mg/L — ABNORMAL HIGH (ref 5.7–26.3)

## 2022-07-30 LAB — MULTIPLE MYELOMA PANEL, SERUM
Albumin SerPl Elph-Mcnc: 3.6 g/dL (ref 2.9–4.4)
Albumin/Glob SerPl: 1 (ref 0.7–1.7)
Alpha 1: 0.1 g/dL (ref 0.0–0.4)
Alpha2 Glob SerPl Elph-Mcnc: 0.8 g/dL (ref 0.4–1.0)
B-Globulin SerPl Elph-Mcnc: 1.4 g/dL — ABNORMAL HIGH (ref 0.7–1.3)
Gamma Glob SerPl Elph-Mcnc: 1.5 g/dL (ref 0.4–1.8)
Globulin, Total: 3.9 g/dL (ref 2.2–3.9)
IgA: 1135 mg/dL — ABNORMAL HIGH (ref 87–352)
IgG (Immunoglobin G), Serum: 387 mg/dL — ABNORMAL LOW (ref 586–1602)
IgM (Immunoglobulin M), Srm: 11 mg/dL — ABNORMAL LOW (ref 26–217)
M Protein SerPl Elph-Mcnc: 0.8 g/dL — ABNORMAL HIGH
Total Protein ELP: 7.5 g/dL (ref 6.0–8.5)

## 2022-08-12 ENCOUNTER — Other Ambulatory Visit: Payer: Self-pay

## 2022-08-12 DIAGNOSIS — C9002 Multiple myeloma in relapse: Secondary | ICD-10-CM

## 2022-08-13 MED ORDER — LENALIDOMIDE 15 MG PO CAPS: 15.0000 mg | ORAL_CAPSULE | Freq: Every day | ORAL | 0 refills | Status: DC

## 2022-08-15 ENCOUNTER — Other Ambulatory Visit: Payer: Self-pay

## 2022-08-27 ENCOUNTER — Encounter: Payer: Self-pay | Admitting: Internal Medicine

## 2022-08-27 ENCOUNTER — Other Ambulatory Visit: Payer: Self-pay | Admitting: Internal Medicine

## 2022-09-01 ENCOUNTER — Other Ambulatory Visit: Payer: Self-pay | Admitting: *Deleted

## 2022-09-01 NOTE — Telephone Encounter (Signed)
Prescription was already sent 08/27/22

## 2022-09-03 ENCOUNTER — Inpatient Hospital Stay: Payer: 59 | Admitting: Certified Registered"

## 2022-09-03 ENCOUNTER — Other Ambulatory Visit: Payer: Self-pay

## 2022-09-03 ENCOUNTER — Encounter: Payer: Self-pay | Admitting: Internal Medicine

## 2022-09-03 ENCOUNTER — Encounter
Admission: EM | Disposition: A | Discharge status: Skilled Nursing Facility | Payer: Self-pay | Source: Ambulatory Visit | Attending: Osteopathic Medicine

## 2022-09-03 ENCOUNTER — Inpatient Hospital Stay: Payer: 59

## 2022-09-03 ENCOUNTER — Inpatient Hospital Stay
Admission: EM | Admit: 2022-09-03 | Discharge: 2022-09-10 | DRG: 486 | Disposition: A | Discharge status: Skilled Nursing Facility | Payer: 59 | Source: Ambulatory Visit | Attending: Internal Medicine | Admitting: Internal Medicine

## 2022-09-03 ENCOUNTER — Emergency Department: Payer: 59

## 2022-09-03 ENCOUNTER — Inpatient Hospital Stay: Payer: 59 | Attending: Nurse Practitioner | Admitting: Internal Medicine

## 2022-09-03 DIAGNOSIS — I5032 Chronic diastolic (congestive) heart failure: Secondary | ICD-10-CM | POA: Diagnosis present

## 2022-09-03 DIAGNOSIS — Z87891 Personal history of nicotine dependence: Secondary | ICD-10-CM

## 2022-09-03 DIAGNOSIS — I10 Essential (primary) hypertension: Secondary | ICD-10-CM | POA: Diagnosis not present

## 2022-09-03 DIAGNOSIS — E038 Other specified hypothyroidism: Secondary | ICD-10-CM | POA: Diagnosis not present

## 2022-09-03 DIAGNOSIS — M79662 Pain in left lower leg: Secondary | ICD-10-CM

## 2022-09-03 DIAGNOSIS — E871 Hypo-osmolality and hyponatremia: Secondary | ICD-10-CM | POA: Diagnosis present

## 2022-09-03 DIAGNOSIS — E876 Hypokalemia: Secondary | ICD-10-CM | POA: Diagnosis present

## 2022-09-03 DIAGNOSIS — M94262 Chondromalacia, left knee: Secondary | ICD-10-CM | POA: Diagnosis present

## 2022-09-03 DIAGNOSIS — Z9484 Stem cells transplant status: Secondary | ICD-10-CM | POA: Diagnosis not present

## 2022-09-03 DIAGNOSIS — C9000 Multiple myeloma not having achieved remission: Secondary | ICD-10-CM | POA: Diagnosis present

## 2022-09-03 DIAGNOSIS — M00162 Pneumococcal arthritis, left knee: Secondary | ICD-10-CM | POA: Diagnosis not present

## 2022-09-03 DIAGNOSIS — K5900 Constipation, unspecified: Secondary | ICD-10-CM | POA: Diagnosis not present

## 2022-09-03 DIAGNOSIS — Z9221 Personal history of antineoplastic chemotherapy: Secondary | ICD-10-CM

## 2022-09-03 DIAGNOSIS — Z79899 Other long term (current) drug therapy: Secondary | ICD-10-CM

## 2022-09-03 DIAGNOSIS — Z7982 Long term (current) use of aspirin: Secondary | ICD-10-CM

## 2022-09-03 DIAGNOSIS — E890 Postprocedural hypothyroidism: Secondary | ICD-10-CM | POA: Diagnosis present

## 2022-09-03 DIAGNOSIS — B953 Streptococcus pneumoniae as the cause of diseases classified elsewhere: Secondary | ICD-10-CM | POA: Diagnosis present

## 2022-09-03 DIAGNOSIS — E039 Hypothyroidism, unspecified: Secondary | ICD-10-CM | POA: Diagnosis present

## 2022-09-03 DIAGNOSIS — Z9481 Bone marrow transplant status: Secondary | ICD-10-CM | POA: Diagnosis not present

## 2022-09-03 DIAGNOSIS — I110 Hypertensive heart disease with heart failure: Secondary | ICD-10-CM | POA: Diagnosis present

## 2022-09-03 DIAGNOSIS — M79605 Pain in left leg: Secondary | ICD-10-CM

## 2022-09-03 DIAGNOSIS — M7989 Other specified soft tissue disorders: Secondary | ICD-10-CM | POA: Insufficient documentation

## 2022-09-03 DIAGNOSIS — M65862 Other synovitis and tenosynovitis, left lower leg: Secondary | ICD-10-CM | POA: Diagnosis present

## 2022-09-03 DIAGNOSIS — Z79890 Hormone replacement therapy (postmenopausal): Secondary | ICD-10-CM | POA: Diagnosis not present

## 2022-09-03 DIAGNOSIS — K219 Gastro-esophageal reflux disease without esophagitis: Secondary | ICD-10-CM | POA: Diagnosis present

## 2022-09-03 DIAGNOSIS — F419 Anxiety disorder, unspecified: Secondary | ICD-10-CM | POA: Diagnosis present

## 2022-09-03 DIAGNOSIS — M0010 Pneumococcal arthritis, unspecified joint: Secondary | ICD-10-CM | POA: Diagnosis present

## 2022-09-03 DIAGNOSIS — I38 Endocarditis, valve unspecified: Secondary | ICD-10-CM | POA: Diagnosis not present

## 2022-09-03 DIAGNOSIS — M009 Pyogenic arthritis, unspecified: Secondary | ICD-10-CM | POA: Diagnosis not present

## 2022-09-03 DIAGNOSIS — J449 Chronic obstructive pulmonary disease, unspecified: Secondary | ICD-10-CM | POA: Diagnosis present

## 2022-09-03 HISTORY — PX: KNEE ARTHROSCOPY: SHX127

## 2022-09-03 LAB — CBC WITH DIFFERENTIAL/PLATELET
Abs Immature Granulocytes: 0.03 10*3/uL (ref 0.00–0.07)
Abs Immature Granulocytes: 0.03 10*3/uL (ref 0.00–0.07)
Basophils Absolute: 0 10*3/uL (ref 0.0–0.1)
Basophils Absolute: 0 10*3/uL (ref 0.0–0.1)
Basophils Relative: 0 %
Basophils Relative: 0 %
Eosinophils Absolute: 0.2 10*3/uL (ref 0.0–0.5)
Eosinophils Absolute: 0.2 10*3/uL (ref 0.0–0.5)
Eosinophils Relative: 2 %
Eosinophils Relative: 2 %
HCT: 28.4 % — ABNORMAL LOW (ref 36.0–46.0)
HCT: 30.2 % — ABNORMAL LOW (ref 36.0–46.0)
Hemoglobin: 9.2 g/dL — ABNORMAL LOW (ref 12.0–15.0)
Hemoglobin: 9.5 g/dL — ABNORMAL LOW (ref 12.0–15.0)
Immature Granulocytes: 0 %
Immature Granulocytes: 0 %
Lymphocytes Relative: 32 %
Lymphocytes Relative: 21 %
Lymphs Abs: 3.4 10*3/uL (ref 0.7–4.0)
Lymphs Abs: 2.1 10*3/uL (ref 0.7–4.0)
MCH: 30.4 pg (ref 26.0–34.0)
MCH: 29.8 pg (ref 26.0–34.0)
MCHC: 32.4 g/dL (ref 30.0–36.0)
MCHC: 31.5 g/dL (ref 30.0–36.0)
MCV: 93.7 fL (ref 80.0–100.0)
MCV: 94.7 fL (ref 80.0–100.0)
Monocytes Absolute: 0.6 10*3/uL (ref 0.1–1.0)
Monocytes Absolute: 0.6 10*3/uL (ref 0.1–1.0)
Monocytes Relative: 5 %
Monocytes Relative: 6 %
Neutro Abs: 6.4 10*3/uL (ref 1.7–7.7)
Neutro Abs: 7 10*3/uL (ref 1.7–7.7)
Neutrophils Relative %: 61 %
Neutrophils Relative %: 71 %
Platelets: 165 10*3/uL (ref 150–400)
Platelets: 170 10*3/uL (ref 150–400)
RBC: 3.03 MIL/uL — ABNORMAL LOW (ref 3.87–5.11)
RBC: 3.19 MIL/uL — ABNORMAL LOW (ref 3.87–5.11)
RDW: 15.9 % — ABNORMAL HIGH (ref 11.5–15.5)
RDW: 16 % — ABNORMAL HIGH (ref 11.5–15.5)
WBC: 10.7 10*3/uL — ABNORMAL HIGH (ref 4.0–10.5)
WBC: 10 10*3/uL (ref 4.0–10.5)
nRBC: 0 % (ref 0.0–0.2)
nRBC: 0 % (ref 0.0–0.2)

## 2022-09-03 LAB — SYNOVIAL CELL COUNT + DIFF, W/ CRYSTALS
Crystals, Fluid: NONE SEEN
Eosinophils-Synovial: 0 %
Lymphocytes-Synovial Fld: 4 %
Monocyte-Macrophage-Synovial Fluid: 0 %
Neutrophil, Synovial: 96 %
Other Cells-SYN: 0
WBC, Synovial: 72482 /mm3 — ABNORMAL HIGH (ref 0–200)

## 2022-09-03 LAB — BASIC METABOLIC PANEL
Anion gap: 12 (ref 5–15)
BUN: 9 mg/dL (ref 8–23)
CO2: 22 mmol/L (ref 22–32)
Calcium: 8.3 mg/dL — ABNORMAL LOW (ref 8.9–10.3)
Chloride: 96 mmol/L — ABNORMAL LOW (ref 98–111)
Creatinine, Ser: 0.9 mg/dL (ref 0.44–1.00)
GFR, Estimated: >60 mL/min (ref >60)
Glucose, Bld: 111 mg/dL — ABNORMAL HIGH (ref 70–99)
Potassium: 3.5 mmol/L (ref 3.5–5.1)
Sodium: 130 mmol/L — ABNORMAL LOW (ref 135–145)

## 2022-09-03 LAB — GLUCOSE, RANDOM: Glucose, Bld: 97 mg/dL (ref 70–99)

## 2022-09-03 LAB — COMPREHENSIVE METABOLIC PANEL
ALT: 20 U/L (ref 0–44)
AST: 21 U/L (ref 15–41)
Albumin: 3.5 g/dL (ref 3.5–5.0)
Alkaline Phosphatase: 92 U/L (ref 38–126)
Anion gap: 14 (ref 5–15)
BUN: 9 mg/dL (ref 8–23)
CO2: 20 mmol/L — ABNORMAL LOW (ref 22–32)
Calcium: 8.1 mg/dL — ABNORMAL LOW (ref 8.9–10.3)
Chloride: 97 mmol/L — ABNORMAL LOW (ref 98–111)
Creatinine, Ser: 0.83 mg/dL (ref 0.44–1.00)
GFR, Estimated: >60 mL/min (ref >60)
Glucose, Bld: 108 mg/dL — ABNORMAL HIGH (ref 70–99)
Potassium: 3.2 mmol/L — ABNORMAL LOW (ref 3.5–5.1)
Sodium: 131 mmol/L — ABNORMAL LOW (ref 135–145)
Total Bilirubin: 0.7 mg/dL (ref 0.3–1.2)
Total Protein: 8.8 g/dL — ABNORMAL HIGH (ref 6.5–8.1)

## 2022-09-03 LAB — LACTIC ACID, PLASMA
Lactic Acid, Venous: 1.6 mmol/L (ref 0.5–1.9)
Lactic Acid, Venous: 1.3 mmol/L (ref 0.5–1.9)

## 2022-09-03 SURGERY — ARTHROSCOPY, KNEE
Anesthesia: General | Site: Knee | Laterality: Left

## 2022-09-03 MED ORDER — CHLORHEXIDINE GLUCONATE 0.12 % MT SOLN
15.0000 mL | Freq: Once | OROMUCOSAL | Status: AC
  Administered 2022-09-03: 15 mL via OROMUCOSAL

## 2022-09-03 MED ORDER — MORPHINE SULFATE (PF) 4 MG/ML IV SOLN: 4.0000 mg | INTRAVENOUS | Status: DC | PRN

## 2022-09-03 MED ORDER — PHENYLEPHRINE HCL-NACL 20-0.9 MG/250ML-% IV SOLN
INTRAVENOUS | Status: AC
  Filled 2022-09-03: qty 250

## 2022-09-03 MED ORDER — EPINEPHRINE PF 1 MG/ML IJ SOLN
INTRAMUSCULAR | Status: AC
  Filled 2022-09-03: qty 3

## 2022-09-03 MED ORDER — SPIRONOLACTONE 25 MG PO TABS
25.0000 mg | ORAL_TABLET | Freq: Every day | ORAL | Status: DC
  Administered 2022-09-03 – 2022-09-10 (×8): 25 mg via ORAL
  Filled 2022-09-03 (×8): qty 1

## 2022-09-03 MED ORDER — LIDOCAINE HCL (CARDIAC) PF 100 MG/5ML IV SOSY
PREFILLED_SYRINGE | INTRAVENOUS | Status: DC | PRN
  Administered 2022-09-03: 100 mg via INTRAVENOUS

## 2022-09-03 MED ORDER — ACETAMINOPHEN 650 MG RE SUPP: 650.0000 mg | Freq: Four times a day (QID) | RECTAL | Status: AC | PRN

## 2022-09-03 MED ORDER — DEXMEDETOMIDINE HCL IN NACL 80 MCG/20ML IV SOLN
INTRAVENOUS | Status: AC
  Filled 2022-09-03: qty 20

## 2022-09-03 MED ORDER — BUPIVACAINE HCL (PF) 0.25 % IJ SOLN
INTRAMUSCULAR | Status: AC
  Filled 2022-09-03: qty 30

## 2022-09-03 MED ORDER — HEPARIN SODIUM (PORCINE) 5000 UNIT/ML IJ SOLN
5000.0000 [IU] | Freq: Three times a day (TID) | INTRAMUSCULAR | Status: DC
  Administered 2022-09-04 – 2022-09-06 (×5): 5000 [IU] via SUBCUTANEOUS
  Filled 2022-09-03 (×5): qty 1

## 2022-09-03 MED ORDER — OXYCODONE HCL 5 MG/5ML PO SOLN: 5.0000 mg | Freq: Once | ORAL | Status: DC | PRN

## 2022-09-03 MED ORDER — SODIUM CHLORIDE 0.9 % IV SOLN
2.0000 g | Freq: Two times a day (BID) | INTRAVENOUS | Status: DC
  Administered 2022-09-04: 2 g via INTRAVENOUS
  Filled 2022-09-03 (×2): qty 12.5

## 2022-09-03 MED ORDER — SODIUM CHLORIDE 0.9 % IV SOLN
2.0000 g | Freq: Once | INTRAVENOUS | Status: AC
  Administered 2022-09-03: 2 g via INTRAVENOUS
  Filled 2022-09-03: qty 12.5

## 2022-09-03 MED ORDER — METOCLOPRAMIDE HCL 5 MG PO TABS: 5.0000 mg | ORAL_TABLET | Freq: Three times a day (TID) | ORAL | Status: DC | PRN

## 2022-09-03 MED ORDER — LIDOCAINE HCL (PF) 1 % IJ SOLN
5.0000 mL | Freq: Once | INTRAMUSCULAR | Status: AC
  Administered 2022-09-03: 5 mL via INTRADERMAL
  Filled 2022-09-03: qty 5

## 2022-09-03 MED ORDER — FENTANYL CITRATE (PF) 100 MCG/2ML IJ SOLN: 25.0000 ug | INTRAMUSCULAR | Status: DC | PRN

## 2022-09-03 MED ORDER — MONTELUKAST SODIUM 10 MG PO TABS
10.0000 mg | ORAL_TABLET | Freq: Every day | ORAL | Status: DC
  Administered 2022-09-03 – 2022-09-09 (×7): 10 mg via ORAL
  Filled 2022-09-03 (×7): qty 1

## 2022-09-03 MED ORDER — METOCLOPRAMIDE HCL 5 MG/ML IJ SOLN: 5.0000 mg | Freq: Three times a day (TID) | INTRAMUSCULAR | Status: DC | PRN

## 2022-09-03 MED ORDER — SEVOFLURANE IN SOLN
RESPIRATORY_TRACT | Status: AC
  Filled 2022-09-03: qty 250

## 2022-09-03 MED ORDER — LACTATED RINGERS IV SOLN: INTRAVENOUS | Status: DC

## 2022-09-03 MED ORDER — MORPHINE SULFATE (PF) 4 MG/ML IV SOLN
INTRAVENOUS | Status: AC
  Filled 2022-09-03: qty 1

## 2022-09-03 MED ORDER — FENTANYL CITRATE (PF) 100 MCG/2ML IJ SOLN
INTRAMUSCULAR | Status: AC
  Filled 2022-09-03: qty 2

## 2022-09-03 MED ORDER — ONDANSETRON HCL 4 MG/2ML IJ SOLN
INTRAMUSCULAR | Status: AC
  Filled 2022-09-03: qty 2

## 2022-09-03 MED ORDER — DEXMEDETOMIDINE HCL IN NACL 80 MCG/20ML IV SOLN
INTRAVENOUS | Status: DC | PRN
  Administered 2022-09-03: 8 ug via BUCCAL

## 2022-09-03 MED ORDER — CARVEDILOL 3.125 MG PO TABS
3.1250 mg | ORAL_TABLET | Freq: Two times a day (BID) | ORAL | Status: DC
  Administered 2022-09-04 – 2022-09-10 (×13): 3.125 mg via ORAL
  Filled 2022-09-03 (×13): qty 1

## 2022-09-03 MED ORDER — PHENYLEPHRINE HCL (PRESSORS) 10 MG/ML IV SOLN
INTRAVENOUS | Status: DC | PRN
  Administered 2022-09-03: 160 ug via INTRAVENOUS
  Administered 2022-09-03: 80 ug via INTRAVENOUS
  Administered 2022-09-03 (×2): 100 ug via INTRAVENOUS

## 2022-09-03 MED ORDER — OXYCODONE HCL 5 MG PO TABS: 5.0000 mg | ORAL_TABLET | Freq: Once | ORAL | Status: DC | PRN

## 2022-09-03 MED ORDER — DOCUSATE SODIUM 100 MG PO CAPS
100.0000 mg | ORAL_CAPSULE | Freq: Two times a day (BID) | ORAL | Status: DC
  Administered 2022-09-03 – 2022-09-07 (×8): 100 mg via ORAL
  Filled 2022-09-03 (×8): qty 1

## 2022-09-03 MED ORDER — MORPHINE SULFATE (PF) 4 MG/ML IV SOLN
4.0000 mg | INTRAVENOUS | Status: DC | PRN
  Administered 2022-09-03: 4 mg via INTRAVENOUS
  Filled 2022-09-03: qty 1

## 2022-09-03 MED ORDER — ACETAMINOPHEN 325 MG PO TABS
650.0000 mg | ORAL_TABLET | Freq: Four times a day (QID) | ORAL | Status: AC | PRN
  Administered 2022-09-04 – 2022-09-07 (×2): 650 mg via ORAL
  Filled 2022-09-03 (×2): qty 2

## 2022-09-03 MED ORDER — SUCCINYLCHOLINE CHLORIDE 200 MG/10ML IV SOSY
PREFILLED_SYRINGE | INTRAVENOUS | Status: DC | PRN
  Administered 2022-09-03: 100 mg via INTRAVENOUS

## 2022-09-03 MED ORDER — LIDOCAINE HCL (PF) 2 % IJ SOLN
INTRAMUSCULAR | Status: AC
  Filled 2022-09-03: qty 5

## 2022-09-03 MED ORDER — VANCOMYCIN HCL 1250 MG/250ML IV SOLN
1250.0000 mg | INTRAVENOUS | Status: DC
  Administered 2022-09-03: 1750 mg via INTRAVENOUS
  Administered 2022-09-04 – 2022-09-05 (×2): 1250 mg via INTRAVENOUS
  Filled 2022-09-03 (×3): qty 250

## 2022-09-03 MED ORDER — SODIUM CHLORIDE 0.9% FLUSH
10.0000 mL | INTRAVENOUS | Status: DC | PRN
  Administered 2022-09-03: 10 mL via INTRAVENOUS
  Filled 2022-09-03: qty 10

## 2022-09-03 MED ORDER — ONDANSETRON HCL 4 MG/2ML IJ SOLN
4.0000 mg | Freq: Once | INTRAMUSCULAR | Status: AC
  Administered 2022-09-03: 4 mg via INTRAVENOUS
  Filled 2022-09-03: qty 2

## 2022-09-03 MED ORDER — SENNOSIDES-DOCUSATE SODIUM 8.6-50 MG PO TABS: 1.0000 | ORAL_TABLET | Freq: Every evening | ORAL | Status: DC | PRN

## 2022-09-03 MED ORDER — ORAL CARE MOUTH RINSE: 15.0000 mL | Freq: Once | OROMUCOSAL | Status: AC

## 2022-09-03 MED ORDER — CHLORHEXIDINE GLUCONATE 0.12 % MT SOLN
OROMUCOSAL | Status: AC
  Filled 2022-09-03: qty 15

## 2022-09-03 MED ORDER — OXYCODONE HCL 5 MG PO TABS
5.0000 mg | ORAL_TABLET | Freq: Once | ORAL | Status: AC
  Administered 2022-09-03: 5 mg via ORAL
  Filled 2022-09-03: qty 1

## 2022-09-03 MED ORDER — MORPHINE SULFATE (PF) 2 MG/ML IV SOLN
2.0000 mg | INTRAVENOUS | Status: DC | PRN
  Administered 2022-09-03 – 2022-09-04 (×3): 2 mg via INTRAVENOUS
  Filled 2022-09-03 (×3): qty 1

## 2022-09-03 MED ORDER — ONDANSETRON HCL 4 MG PO TABS: 4.0000 mg | ORAL_TABLET | Freq: Four times a day (QID) | ORAL | Status: DC | PRN

## 2022-09-03 MED ORDER — VANCOMYCIN HCL 1750 MG/350ML IV SOLN
1750.0000 mg | Freq: Once | INTRAVENOUS | Status: DC
  Filled 2022-09-03 (×2): qty 350

## 2022-09-03 MED ORDER — FENTANYL CITRATE (PF) 100 MCG/2ML IJ SOLN
INTRAMUSCULAR | Status: DC | PRN
  Administered 2022-09-03 (×3): 50 ug via INTRAVENOUS

## 2022-09-03 MED ORDER — IPRATROPIUM-ALBUTEROL 0.5-2.5 (3) MG/3ML IN SOLN: 3.0000 mL | Freq: Four times a day (QID) | RESPIRATORY_TRACT | Status: AC | PRN

## 2022-09-03 MED ORDER — NEOMYCIN-POLYMYXIN B GU 40-200000 IR SOLN
Status: AC
  Filled 2022-09-03: qty 20

## 2022-09-03 MED ORDER — PROPOFOL 10 MG/ML IV BOLUS
INTRAVENOUS | Status: DC | PRN
  Administered 2022-09-03: 20 mg via INTRAVENOUS
  Administered 2022-09-03: 50 mg via INTRAVENOUS
  Administered 2022-09-03: 100 mg via INTRAVENOUS
  Administered 2022-09-03: 40 mg via INTRAVENOUS

## 2022-09-03 MED ORDER — HEPARIN SOD (PORK) LOCK FLUSH 100 UNIT/ML IV SOLN
500.0000 [IU] | Freq: Once | INTRAVENOUS | Status: DC
  Filled 2022-09-03: qty 5

## 2022-09-03 MED ORDER — NEOMYCIN-POLYMYXIN B GU 40-200000 IR SOLN
Status: DC | PRN
  Administered 2022-09-03: 9036 mL

## 2022-09-03 MED ORDER — PROPOFOL 10 MG/ML IV BOLUS
INTRAVENOUS | Status: AC
  Filled 2022-09-03: qty 20

## 2022-09-03 MED ORDER — LEVOTHYROXINE SODIUM 50 MCG PO TABS
100.0000 ug | ORAL_TABLET | Freq: Every day | ORAL | Status: DC
  Administered 2022-09-04 – 2022-09-10 (×7): 100 ug via ORAL
  Filled 2022-09-03 (×7): qty 2

## 2022-09-03 MED ORDER — ONDANSETRON HCL 4 MG/2ML IJ SOLN: 4.0000 mg | Freq: Four times a day (QID) | INTRAMUSCULAR | Status: DC | PRN

## 2022-09-03 MED ORDER — PHENYLEPHRINE HCL (PRESSORS) 10 MG/ML IV SOLN
INTRAVENOUS | Status: AC
  Filled 2022-09-03: qty 1

## 2022-09-03 MED ORDER — ONDANSETRON HCL 4 MG PO TABS: 4.0000 mg | ORAL_TABLET | Freq: Four times a day (QID) | ORAL | Status: AC | PRN

## 2022-09-03 MED ORDER — ROPIVACAINE HCL 5 MG/ML IJ SOLN
INTRAMUSCULAR | Status: AC
  Filled 2022-09-03: qty 20

## 2022-09-03 MED ORDER — ONDANSETRON HCL 4 MG/2ML IJ SOLN: 4.0000 mg | Freq: Four times a day (QID) | INTRAMUSCULAR | Status: AC | PRN

## 2022-09-03 MED ORDER — METHYLPREDNISOLONE ACETATE 40 MG/ML IJ SUSP
INTRAMUSCULAR | Status: AC
  Filled 2022-09-03: qty 1

## 2022-09-03 MED ORDER — BUPROPION HCL 75 MG PO TABS
75.0000 mg | ORAL_TABLET | Freq: Every day | ORAL | Status: DC
  Administered 2022-09-03 – 2022-09-10 (×8): 75 mg via ORAL
  Filled 2022-09-03 (×8): qty 1

## 2022-09-03 MED ORDER — HYDRALAZINE HCL 20 MG/ML IJ SOLN: 5.0000 mg | Freq: Three times a day (TID) | INTRAMUSCULAR | Status: AC | PRN

## 2022-09-03 SURGICAL SUPPLY — 39 items
ADAPTER IRRIG TUBE 2 SPIKE SOL (ADAPTER) ×2 IMPLANT
BLADE FULL RADIUS 3.5 (BLADE) IMPLANT
BLADE INCISOR PLUS 4.5 (BLADE) ×1 IMPLANT
BLADE SHAVER 4.5 DBL SERAT CV (CUTTER) ×1 IMPLANT
BLADE SURG SZ11 CARB STEEL (BLADE) ×1 IMPLANT
BRUSH SCRUB EZ  4% CHG (MISCELLANEOUS) ×2
BRUSH SCRUB EZ 4% CHG (MISCELLANEOUS) ×2 IMPLANT
CHLORAPREP W/TINT 26 (MISCELLANEOUS) ×1 IMPLANT
COOLER POLAR GLACIER W/PUMP (MISCELLANEOUS) IMPLANT
DRAPE ARTHRO LIMB 89X125 STRL (DRAPES) ×1 IMPLANT
GAUZE SPONGE 4X4 12PLY STRL (GAUZE/BANDAGES/DRESSINGS) ×1 IMPLANT
GAUZE XEROFORM 1X8 LF (GAUZE/BANDAGES/DRESSINGS) ×1 IMPLANT
GLOVE BIOGEL PI IND STRL 8.5 (GLOVE) ×1 IMPLANT
GLOVE SURG ORTHO 8.5 STRL (GLOVE) ×1 IMPLANT
GOWN STRL REUS W/ TWL LRG LVL3 (GOWN DISPOSABLE) ×1 IMPLANT
GOWN STRL REUS W/ TWL XL LVL3 (GOWN DISPOSABLE) ×1 IMPLANT
GOWN STRL REUS W/TWL LRG LVL3 (GOWN DISPOSABLE) ×1
GOWN STRL REUS W/TWL XL LVL3 (GOWN DISPOSABLE) ×1
IV LACTATED RINGER IRRG 3000ML (IV SOLUTION) ×2
IV LR IRRIG 3000ML ARTHROMATIC (IV SOLUTION) ×2 IMPLANT
KIT TURNOVER KIT A (KITS) ×1 IMPLANT
MANIFOLD NEPTUNE II (INSTRUMENTS) ×2 IMPLANT
MAT ABSORB  FLUID 56X50 GRAY (MISCELLANEOUS) ×1
MAT ABSORB FLUID 56X50 GRAY (MISCELLANEOUS) ×1 IMPLANT
NDL SPNL 20GX3.5 QUINCKE YW (NEEDLE) ×1 IMPLANT
NEEDLE SPNL 20GX3.5 QUINCKE YW (NEEDLE) ×1 IMPLANT
PACK ARTHROSCOPY KNEE (MISCELLANEOUS) ×1 IMPLANT
PAD ABD DERMACEA PRESS 5X9 (GAUZE/BANDAGES/DRESSINGS) ×2 IMPLANT
PAD WRAPON POLAR KNEE (MISCELLANEOUS) IMPLANT
SLEEVE REMOTE CONTROL 5X12 (DRAPES) IMPLANT
SPONGE T-LAP 18X18 ~~LOC~~+RFID (SPONGE) ×1 IMPLANT
SUT ETHILON 4-0 (SUTURE) ×1
SUT ETHILON 4-0 FS2 18XMFL BLK (SUTURE) ×1
SUTURE ETHLN 4-0 FS2 18XMF BLK (SUTURE) ×1 IMPLANT
TRAP FLUID SMOKE EVACUATOR (MISCELLANEOUS) ×1 IMPLANT
TUBING INFLOW SET DBFLO PUMP (TUBING) ×1 IMPLANT
WAND WEREWOLF FLOW 90D (MISCELLANEOUS) IMPLANT
WATER STERILE IRR 500ML POUR (IV SOLUTION) ×1 IMPLANT
WRAPON POLAR PAD KNEE (MISCELLANEOUS)

## 2022-09-03 NOTE — Op Note (Signed)
  PATIENT:  Brandi [name]  PRE-OPERATIVE DIAGNOSIS:  SEPTIC LEFT KNEE  POST-OPERATIVE DIAGNOSIS:  Same  PROCEDURE:  LEFT ARTHROSCOPIC KNEE DEBRIDEMENT  SURGEON:  Kurtis Bushman, MD  ANESTHESIA:   General  PREOPERATIVE INDICATIONS:  Brandi [name]  72 y.o. female with a diagnosis of SEPTIC LEFT KNEE who failed conservative management and elected for surgical management.    The risks benefits and alternatives were discussed with the patient preoperatively including the risks of infection, bleeding, nerve injury, knee stiffness, persistent pain, osteoarthritis and the need for further surgery. Medical  risks include DVT and pulmonary embolism, myocardial infarction, stroke, pneumonia, respiratory failure and death. The patient understood these risks and wished to proceed.   OPERATIVE FINDINGS: The suprapatellar pouch, medial and lateral gutters were inspected and found to be free of any loose bodies. The undersurface of the patella and landing zone were inspected and grade 3 chondromalacia was noted. There was no evidence of lateral subluxation. The medial compartment showed degenerative fraying of the posterior horn, then anterior horn was intact and stable to probing. Grade 3 chondromalacia was identified in the medial compartment.  The notch was inspected and the ACL and PCL were intact and stable to probing. The lateral compartment was entered and minimal degenerative changes were identified. The lateral meniscus had degenerative tearing along the posterior horn. The anterior horn was intact a stable.  OPERATIVE PROCEDURE: Patient was met in the preoperative area. The operative extremity was signed with my initials according the hospital's correct site of surgery protocol.  The patient was brought to the operating room where they was placed supine on the operative table. General anesthesia was administered. The patient was prepped and draped in a sterile fashion.  A timeout was performed  to verify the patient's name, date of birth, medical record number, correct site of surgery correct procedure to be performed. It was also used to verify the patient received antibiotics that all appropriate instruments, and radiographic studies were available in the room. Once all in attendance were in agreement, the case began.  Proposed arthroscopy incisions were drawn out with a surgical marker. An 11 blade was used to establish an inferior lateral and inferomedial portals. The inferomedial portal was created using a 18-gauge spinal needle under direct visualization. A superior/lateral portal was created for outflow.  A full diagnostic examination of the knee was performed, please see findings for a complete list of results.  Patient had the extensive synovitis treated with the shaver and electrocautery.  The knee was then copiously lavaged with 9 L of GU laced saline. All arthroscopic instruments were removed. The 3 arthroscopy portals were closed with 4-0 nylon. A dry sterile and compressive dressing was applied. The patient was brought to the PACU in stable condition. I was scrubbed and present for the entire case and all sharp and instrument counts were correct at the conclusion the case. I spoke with the patient's family postoperatively to let them know the case was performed without complication and the patient was stable in the recovery room.  Kurtis Bushman, MD

## 2022-09-03 NOTE — H&P (Addendum)
History and Physical   Brandi [name] [id number] DOB: 11/08/1950 DOA: 09/03/2022  PCP: Marguerita Merles, MD Patient coming from: Oncology clinic  I have personally briefly reviewed patient's old medical records in [place].  Chief Concern: Left knee pain  HPI: Brandi [name] is a 72 year old female with history of multiple myeloma, who presents from oncology clinic for chief concerns of 3 days of worsening left knee pain.  Initial vitals in the ED showed temperature of 99.6, respiration rate of 21, heart rate 83, blood pressure 137/82, SpO2 100% on room air.  Serum sodium is 130, potassium 3.5, chloride 96, bicarb 22, BUN of 9, serum creatinine of 0.90, EGFR greater than 60, nonfasting blood glucose 111, WBC 10,000, hemoglobin 9.5, platelets of 170.  Lactic acid is 1.6.  ED treatment joint aspiration, using lidocaine 5 mg IV one-time dose, ondansetron 4 mg IV, cefepime.  EDP consulted orthopedic surgery, Dr. Harlow Mares.  Per EDP, orthopedic service plans to take patient to the OR on day of admission. ------------------------ At bedside, she is able to tell me her name, age, current location, current calendar year.   She describes the pain as sharp, 10/10, started three days ago. She denies trauma to her person. She denies nausea, vomiting, swelling. She endorses left calf pain that started three days ago. She endorses fever at home. She denies cough, shortness of breath.   Social history: She denies tobacco and etoh use.  ROS: Constitutional: no weight change, no fever ENT/Mouth: no sore throat, no rhinorrhea Eyes: no eye pain, no vision changes Cardiovascular: no chest pain, no dyspnea,  no edema, no palpitations Respiratory: no cough, no sputum, no wheezing Gastrointestinal: no nausea, no vomiting, no diarrhea, no constipation Genitourinary: no urinary incontinence, no dysuria, no hematuria Musculoskeletal: no arthralgias, no myalgias Skin: no skin lesions, no  pruritus, Neuro: + weakness, no loss of consciousness, no syncope Psych: no anxiety, no depression, + decrease appetite Heme/Lymph: no bruising, no bleeding  ED Course: Discussed with emergency medicine provider, patient requiring hospitalization for chief concerns of septic arthritis of the left knee.  Assessment/Plan  Principal Problem:   Septic arthritis of knee, left (HCC) Active Problems:   Essential hypertension   S/P autologous bone marrow transplantation (HCC)   Chronic heart failure with preserved ejection fraction (HFpEF) (HCC)   Hyponatremia   Hypothyroidism   COPD (chronic obstructive pulmonary disease) (HCC)   GERD (gastroesophageal reflux disease)   Leg swelling   Assessment and Plan:  * Septic arthritis of knee, left (HCC) - Body fluid from left knee aspirate culture ordered  - Orthopedic surgery, Dr. Harlow Mares has been consulted for joint washout - Continue with vancomycin and cefepime - Symptomatic support: Acetaminophen 650 mg p.o. every 6 hours as needed for mild pain, fever, headache; morphine 2 mg IV to 4 hours as needed for moderate pain, 4 doses ordered; morphine 4 mg IV every 4 hours as needed for severe pain, 4 doses ordered - AM team to reevaluate patient at bedside for continued opioid pain requirements - Admit to MedSurg, inpatient  Leg swelling - Left leg swelling with calf tenderness, started 3 days ago inpatient with multiple myeloma - Ultrasound of the left lower extremity ordered to assess for DVT  COPD (chronic obstructive pulmonary disease) ([place]) Patient is not in acute exacerbation - DuoNebs every 6 hours as needed for shortness of breath and wheezing  Hypothyroidism - Levothyroxine 100 mcg daily resumed before breakfast  Hyponatremia - Appears mild and asymptomatic -  Sodium chloride 75 mL/h, 1 day ordered - Repeat BMP in a.m.  Chronic heart failure with preserved ejection fraction (HFpEF) (HCC) Patient not in acute exacerbation -  Strict I's and O's  Essential hypertension - Coreg 3.125 mg p.o. twice daily resumed - Hydralazine 5 mg every 8 hours as needed for SBP greater than 180, 4 days ordered  Pending med reconciliation  Chart reviewed.   DVT prophylaxis: heparin 5000 units at 09/04/22 Code Status: full code Diet: NPO pending surgery Family Communication: no Disposition Plan: pending clinical course no Consults called: orthopedic, Dr. Harlow Mares Admission status: med-surg, inpatient  Past Medical History:  Diagnosis Date   (HFpEF) heart failure with preserved ejection fraction ([place])    a. 06/2020 Echo: EF 60-65%, no rwma, nl RV size/fxn. Triv MR. Triv TR/AI. Mod elev PASP.   Anxiety    COPD (chronic obstructive pulmonary disease) (HCC)    GERD (gastroesophageal reflux disease)    Hypertension    Hyperthyroidism    Hypokalemia    IgA myeloma (HCC)    Multiple myeloma (HCC)    Pneumonia    Red blood cell antibody positive, compatible PRBC difficult to obtain    S/P autologous bone marrow transplantation [facility])    Past Surgical History:  Procedure Laterality Date   ANTERIOR VITRECTOMY Left 10/07/2021   Procedure: ANTERIOR VITRECTOMY;  Surgeon: Leandrew Koyanagi, MD;  Location: [place];  Service: Ophthalmology;  Laterality: Left;   CATARACT EXTRACTION W/PHACO Left 10/07/2021   Procedure: CATARACT EXTRACTION PHACO AND INTRAOCULAR LENS PLACEMENT ([place]) LEFT VISION BLUE;  Surgeon: Leandrew Koyanagi, MD;  Location: [place];  Service: Ophthalmology;  Laterality: Left;  kit for manual incision 14.56 01:21.4   CATARACT EXTRACTION W/PHACO Right 10/21/2021   Procedure: CATARACT EXTRACTION PHACO AND INTRAOCULAR LENS PLACEMENT ([facility]) RIGHT 11.12 01:48.1;  Surgeon: Leandrew Koyanagi, MD;  Location: [place];  Service: Ophthalmology;  Laterality: Right;   ESOPHAGOGASTRODUODENOSCOPY (EGD) WITH PROPOFOL N/A 03/30/2019   Procedure: ESOPHAGOGASTRODUODENOSCOPY (EGD) WITH PROPOFOL;   Surgeon: Jonathon Bellows, MD;  Location[facility] ENDOSCOPY;  Service: Gastroenterology;  Laterality: N/A;   IR FLUORO GUIDE PORT INSERTION RIGHT  12/16/2017   IR IMAGING GUIDED PORT INSERTION  06/15/2021   THYROIDECTOMY N/A    Social History:  reports that she quit smoking about 17 months ago. Her smoking use included cigarettes. She smoked an average of .25 packs per day. She has never used smokeless tobacco. She reports that she does not drink alcohol and does not use drugs.  No Known Allergies Family History  Problem Relation Age of Onset   Pneumonia Mother    Seizures Father    Family history: Family history reviewed and not pertinent  Prior to Admission medications   Medication Sig Start Date End Date Taking? Authorizing Provider  acyclovir (ZOVIRAX) 400 MG tablet Take 1 tablet (400 mg total) by mouth 2 (two) times daily. 04/28/21   [name], MD  albuterol (VENTOLIN HFA) 108 (90 Base) MCG/ACT inhaler Inhale 2 puffs into the lungs every 6 (six) hours as needed for wheezing or shortness of breath. 02/25/22   Hughie Closs, PA-C  aspirin EC 81 MG EC tablet Take 1 tablet (81 mg total) by mouth daily. 01/25/20   Loel Dubonnet, NP  benzonatate (TESSALON) 100 MG capsule Take 1 capsule (100 mg total) by mouth 2 (two) times daily as needed for cough. 06/25/22   [name], MD  buPROPion (WELLBUTRIN) 75 MG tablet Take 75 mg by mouth daily.  03/24/21   [provider]  calcium-vitamin D (OSCAL WITH D) 500MG-200UNIT (5MCG) tablet Take 1 tablet by mouth 2 (two) times daily. 05/18/21   Borders, Kirt Boys, NP  carvedilol (COREG) 3.125 MG tablet Take 1 tablet (3.125 mg total) by mouth 2 (two) times daily with a meal. 11/27/21   Furth, Cadence H, PA-C  dexamethasone (DECADRON) 4 MG tablet Take dexamethasone 20 mg [5 pills] weekly on Thursdays. # Do not take dexamethasone in the week of chemotherapy. Take in the mornings 04/22/22   [name], MD  Ensure Plus (ENSURE  PLUS) LIQD Take 237 mLs by mouth 3 (three) times a week.    [provider]  furosemide (LASIX) 40 MG tablet Take 1 tablet (40 mg total) by mouth daily. Patient not taking: Reported on 09/03/2022 10/02/21   Furth, Cadence H, PA-C  lenalidomide (REVLIMID) 15 MG capsule Take 1 capsule (15 mg total) by mouth daily. Take for 21 days, then hold for 7 days. Repeat every 28 days. 08/13/22   [name], MD  levothyroxine (SYNTHROID) 100 MCG tablet TAKE 1 TABLET BY MOUTH BEFORE BREAKFAST 05/11/22   [name], MD  lidocaine-prilocaine (EMLA) cream Apply 1 application topically as needed. Apply to port and cover with saran wrap 1-2 hours prior to port access 07/03/21   [name], MD  loperamide (IMODIUM) 2 MG capsule Take 2 mg by mouth as needed for diarrhea or loose stools. Patient not taking: Reported on 09/03/2022    [provider]  magnesium oxide (MAG-OX) 400 (240 Mg) MG tablet Take 1 tablet (400 mg total) by mouth 2 (two) times daily. 09/17/21   [name], MD  montelukast (SINGULAIR) 10 MG tablet Take 1 tablet (10 mg total) by mouth at bedtime. 04/22/22   [name], MD  ondansetron (ZOFRAN) 8 MG tablet One pill every 8 hours as needed for nausea/vomitting. 04/28/21   [name], MD  pantoprazole (PROTONIX) 40 MG tablet Take 1 tablet (40 mg total) by mouth daily. 02/25/22 09/03/22  Hughie Closs, PA-C  polyethylene glycol (MIRALAX / Floria Raveling) packet Take 17 g by mouth daily as needed for mild constipation. 04/11/17   Gouru, Illene Silver, MD  potassium chloride SA (KLOR-CON M) 20 MEQ tablet Take 1 tablet (20 mEq total) by mouth 2 (two) times daily. 02/25/22   Hughie Closs, PA-C  spironolactone (ALDACTONE) 25 MG tablet Take 1 tablet by mouth once daily 04/14/22   End, Harrell Gave, MD  traMADol (ULTRAM) 50 MG tablet TAKE 1 TABLET BY MOUTH EVERY 12 HOURS AS NEEDED 08/27/22   [name], MD  Vitamin D, Ergocalciferol, (DRISDOL)  1.25 MG (50000 UNIT) CAPS capsule Take 1 capsule by mouth once a week 07/12/22   Hughie Closs, PA-C   Physical Exam: Vitals:   09/03/22 1230 09/03/22 1245 09/03/22 1300 09/03/22 1557  BP:    130/76  Pulse: 83 88 83 80  Resp: 15 (!) 26 (!) 21 18  Temp:    98.5 F (36.9 C)  TempSrc:    Temporal  SpO2: 99% 100% 99% 99%  Weight:    77.6 kg  Height:    5' 2"$  (1.575 m)   Constitutional: appears age appropriate, NAD, calm, comfortable Eyes: PERRL, lids and conjunctivae normal ENMT: Mucous membranes are moist. Posterior pharynx clear of any exudate or lesions. Age-appropriate dentition. Hearing appropriate Neck: normal, supple, no masses, no thyromegaly Respiratory: clear to auscultation bilaterally, no wheezing, no crackles. Normal respiratory  effort. No accessory muscle use.  Cardiovascular: Regular rate and rhythm, no murmurs / rubs / gallops. + left lower extremity edema. 2+ pedal pulses. No carotid bruits.  Abdomen: obese abdomen, no tenderness, no masses palpated, no hepatosplenomegaly. Bowel sounds positive.  Musculoskeletal: no clubbing / cyanosis. No joint deformity upper and lower extremities. Good ROM, no contractures, no atrophy. Normal muscle tone. Left lower extremity swelling and pain at the left knee and calf Skin: no rashes, lesions, ulcers. No induration Neurologic: Sensation intact. Strength 5/5 in all 4.  Psychiatric: Normal judgment and insight. Alert and oriented x 3. Normal mood.   EKG: Not indicated at this time  X-rays on Admission: I personally reviewed and I agree with radiologist reading as below.  DG Knee Complete 4 Views Left  Result Date: 09/03/2022 CLINICAL DATA:  Left knee pain. Evaluate for fracture and infection. EXAM: LEFT KNEE - COMPLETE 4+ VIEW COMPARISON:  Bone survey 04/08/2017 FINDINGS: Left knee is located with a moderate sized suprapatellar joint effusion. Mild medial joint space narrowing. Osteophytosis in the medial knee compartment and  patellofemoral compartment. Old fracture involving the proximal fibula. Atherosclerotic calcifications in the left lower extremity. IMPRESSION: 1. No acute bone abnormality in the left knee. 2. Moderate-sized joint effusion. 3. 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 4. Old fracture involving the proximal fibula. Electronically Signed   By: Markus Daft M.D.   On: 09/03/2022 12:50    Labs on Admission: I have personally reviewed following labs  CBC: Recent Labs  Lab 09/03/22 0947 09/03/22 1147  WBC 10.7* 10.0  NEUTROABS 6.4 7.0  HGB 9.2* 9.5*  HCT 28.4* 30.2*  MCV 93.7 94.7  PLT 165 [id number]   Basic Metabolic Panel: Recent Labs  Lab 09/03/22 0947 09/03/22 1147  NA 131* 130*  K 3.2* 3.5  CL 97* 96*  CO2 20* 22  GLUCOSE 108* 111*  BUN 9 9  CREATININE 0.83 0.90  CALCIUM 8.1* 8.3*   GFR: Estimated Creatinine Clearance: 56.1 mL/min (by C-G formula based on SCr of 0.9 mg/dL).  Liver Function Tests: Recent Labs  Lab 09/03/22 0947  AST 21  ALT 20  ALKPHOS 92  BILITOT 0.7  PROT 8.8*  ALBUMIN 3.5   Urine analysis:    Component Value Date/Time   COLORURINE STRAW (A) 05/25/2021 0049   APPEARANCEUR CLEAR (A) 05/25/2021 0049   LABSPEC 1.008 05/25/2021 0049   PHURINE 5.0 05/25/2021 [place] 05/25/2021 0049   [place] (A) 05/25/2021 0049   BILIRUBINUR NEGATIVE 05/25/2021 [place] 05/25/2021 0049   PROTEINUR 30 (A) 05/25/2021 0049   NITRITE NEGATIVE 05/25/2021 0049   LEUKOCYTESUR NEGATIVE 05/25/2021 0049   This document was prepared using Dragon Voice Recognition software and may include unintentional dictation errors.  Dr. Tobie Poet [facility]  If 7PM-7AM, please contact overnight-coverage provider If 7AM-7PM, please contact day coverage provider [email or web address]  09/03/2022, 4:02 PM

## 2022-09-03 NOTE — ED Triage Notes (Signed)
Pt to ED from the cancer center for possible infection in her left knee. Pt's knee is swollen and painful.  Pt states that the swelling has been there for 3 days. Pt states that she is currently on chemo. Pt states that she takes the chemo pill, last took it yesterday. Her cancer doctor told her not to take it today dur to possibility of infection. Pt is currently in NAD.

## 2022-09-03 NOTE — Transfer of Care (Signed)
Immediate Anesthesia Transfer of Care Note  Patient: Brandi [name]  Procedure(s) Performed: ARTHROSCOPY KNEE DEBRIDEMENT (Left: Knee)  Patient Location: PACU  Anesthesia Type:General  Level of Consciousness: awake, alert , and oriented  Airway & Oxygen Therapy: Patient Spontanous Breathing  Post-op Assessment: Report given to RN and Post -op Vital signs reviewed and stable  Post vital signs: Reviewed and stable  Last Vitals:  Vitals Value Taken Time  BP 115/79 09/03/22 1735  Temp    Pulse 80 09/03/22 1736  Resp 11 09/03/22 1736  SpO2 97 % 09/03/22 1736  Vitals shown include unvalidated device data.  Last Pain:  Vitals:   09/03/22 1557  TempSrc: Temporal  PainSc: 3          Complications: No notable events documented.

## 2022-09-03 NOTE — Assessment & Plan Note (Signed)
-   Body fluid from left knee aspirate culture ordered  - Orthopedic surgery, Dr. Harlow Mares has been consulted for joint washout - Continue with vancomycin and cefepime - Symptomatic support: Acetaminophen 650 mg p.o. every 6 hours as needed for mild pain, fever, headache; morphine 2 mg IV to 4 hours as needed for moderate pain, 4 doses ordered; morphine 4 mg IV every 4 hours as needed for severe pain, 4 doses ordered - AM team to reevaluate patient at bedside for continued opioid pain requirements - Admit to MedSurg, inpatient

## 2022-09-03 NOTE — Progress Notes (Signed)
Pt given 5 mg oxycodone for pain in L LE. Severe pain and swelling in that leg. Port remains accessed. RN to transport patient to the ED for evaluation and work up. Daughter and patient agreeable to plan.

## 2022-09-03 NOTE — Hospital Course (Addendum)
Brandi [name] is a 72 year old female with history of multiple myeloma, who presents from oncology clinic for chief concerns of 3 days of worsening left knee pain. 02/09: to ED, admitted to hospitalist service, to OR for arthroscopic joint washout w/ Dr. Harlow Mares, continued on vancomycin and cefepime pending joint fluid cultures. DVT ruled out.  02/10: PT recs for SNF but pt states she is not interested in this at this time. Awaiting Cx from joint aspirate. Pain control.  02/11: Cx NG<24h from fluid culture, surgical culture still pending   Consultants:  Orthopedics  Procedures: 09/03/2022 joint aspiration in ED  09/03/2022 arthroscopic L knee debridement       ASSESSMENT & PLAN:   Principal Problem:   Septic arthritis of knee, left ([place]) Active Problems:   Multiple myeloma not having achieved remission ([place])   Essential hypertension   S/P autologous bone marrow transplantation ([place])   Chronic heart failure with preserved ejection fraction (HFpEF) (HCC)   Hyponatremia   Hypothyroidism   COPD (chronic obstructive pulmonary disease) (HCC)   GERD (gastroesophageal reflux disease)   Leg swelling  Septic arthritis of knee, left (HCC) Leg swelling DVT r/o Body fluid from left knee aspirate culture collected 02/09 no growth so far, deep wound culture also collected from surgery later on 02/09 is pending.   Continue vancomycin and cefepime - today 09/05/22 is Day 3 Pain control: Acetaminophen 650 mg p.o. every 6 hours as needed for mild pain, fever, headache; oxycodone started yesterday 10 mg q4h prn mod-severe pain and morphine for breakthrough pain, increased interval today on po meds to q6h prn and plan to continue to taper dose/frequency  Ortho following, recs: up w/ therapy, no restrictions, recommend ID consult, dressing change tomorrow  Hypokalemia Replaced Monitor BMP  Hypothyroidism Levothyroxine 100 mcg daily resumed before breakfast  Essential hypertension Coreg  3.125 mg p.o. twice daily resumed Hydralazine 5 mg every 8 hours as needed for SBP greater than 180, 4 days ordered  Chronic heart failure with preserved ejection fraction (HFpEF) (HCC) Patient not in acute exacerbation Strict I's and O's  COPD (chronic obstructive pulmonary disease) ([place]) Patient is not in acute exacerbation DuoNebs every 6 hours as needed for shortness of breath and wheezing  Hyponatremia - resolved w /IV fluids  BMP in a.m.    DVT prophylaxis: heparin Pertinent IV fluids/nutrition: no continuous IV fluids, toelrating diet  Central lines / invasive devices: none  Code Status: FULL CODE  Current Admission Status: inpatient  TOC needs / Dispo plan: SNF vs HH  Barriers to discharge / significant pending items: pain control, culture results, possible placement

## 2022-09-03 NOTE — Assessment & Plan Note (Signed)
-   Coreg 3.125 mg p.o. twice daily resumed - Hydralazine 5 mg every 8 hours as needed for SBP greater than 180, 4 days ordered

## 2022-09-03 NOTE — Anesthesia Postprocedure Evaluation (Signed)
Anesthesia Post Note  Patient: PRIM AMOAH  Procedure(s) Performed: ARTHROSCOPY KNEE DEBRIDEMENT (Left: Knee)  Patient location during evaluation: PACU Anesthesia Type: General Level of consciousness: awake and alert Pain management: pain level controlled Vital Signs Assessment: post-procedure vital signs reviewed and stable Respiratory status: spontaneous breathing, nonlabored ventilation, respiratory function stable and patient connected to nasal cannula oxygen Cardiovascular status: blood pressure returned to baseline and stable Postop Assessment: no apparent nausea or vomiting Anesthetic complications: no   No notable events documented.   Last Vitals:  Vitals:   09/03/22 1800 09/03/22 1808  BP: (!) 141/82   Pulse: 84 84  Resp: 13 15  Temp:  37.2 C  SpO2: 97% 98%    Last Pain:  Vitals:   09/03/22 1808  TempSrc:   PainSc: 0-No pain                 Ilene Qua

## 2022-09-03 NOTE — Anesthesia Procedure Notes (Signed)
Procedure Name: Intubation Date/Time: 09/03/2022 4:30 PM  Performed by: [name], CRNAPre-anesthesia Checklist: Patient identified, Patient being monitored, Timeout performed, Emergency Drugs available and Suction available Patient Re-evaluated:Patient Re-evaluated prior to induction Oxygen Delivery Method: Circle system utilized Preoxygenation: Pre-oxygenation with 100% oxygen Induction Type: IV induction Ventilation: Mask ventilation without difficulty Laryngoscope Size: 3 and McGraph Grade View: Grade I Tube type: Oral Tube size: 6.5 mm Number of attempts: 1 Airway Equipment and Method: Stylet Placement Confirmation: ETT inserted through vocal cords under direct vision, positive ETCO2 and breath sounds checked- equal and bilateral Secured at: 21 cm Tube secured with: Tape Dental Injury: Teeth and Oropharynx as per pre-operative assessment  Comments: Atrumatic intubation, smooth intubation. No complications noted.

## 2022-09-03 NOTE — Consult Note (Signed)
PHARMACY -  BRIEF ANTIBIOTIC NOTE   Pharmacy has received consult(s) for vancomycin from an ED provider. Patient is also ordered cefepime. The patient's profile has been reviewed for ht/wt/allergies/indication/available labs.    One time order(s) placed for  --Vancomycin 1.75 g IV  Further antibiotics/pharmacy consults should be ordered by admitting physician if indicated.                       Thank you, Benita Gutter 09/03/2022  3:24 PM

## 2022-09-03 NOTE — ED Provider Notes (Signed)
[facility] Provider Note    Event Date/Time   First MD Initiated Contact with Patient 09/03/22 1150     (approximate)   History   Knee Pain   HPI  Brandi [name] is a 72 y.o. female Struve multiple myeloma on chemotherapy presents to the ER from oncology clinic due to 3 days of worsening left knee pain and swelling.  Denies any trauma.  This she may have slept on it wrong.  Does feel warm.  Has had some chills but no measured fever.  Unable to bend it secondary to pain.  Denies any history of gout.     Physical Exam   Triage Vital Signs: ED Triage Vitals  Enc Vitals Group     BP 09/03/22 1142 (!) 144/73     Pulse Rate 09/03/22 1142 87     Resp 09/03/22 1142 16     Temp 09/03/22 1142 99.6 F (37.6 C)     Temp Source 09/03/22 1142 Oral     SpO2 09/03/22 1142 100 %     Weight 09/03/22 1143 171 lb (77.6 kg)     Height 09/03/22 1143 5' 2"$  (1.575 m)     Head Circumference --      Peak Flow --      Pain Score 09/03/22 1143 8     Pain Loc --      Pain Edu? --      Excl. in [place]? --     Most recent vital signs: Vitals:   09/03/22 1245 09/03/22 1300  BP:    Pulse: 88 83  Resp: (!) 26 (!) 21  Temp:    SpO2: 100% 99%     Constitutional: Alert  Eyes: Conjunctivae are normal.  Head: Atraumatic. Nose: No congestion/rhinnorhea. Mouth/Throat: Mucous membranes are moist.   Neck: Painless ROM.  Cardiovascular:   Good peripheral circulation. Respiratory: Normal respiratory effort.  No retractions.  Gastrointestinal: Soft and nontender.  Musculoskeletal: No pitting edema left knee is swollen with effusion.  Unable to passively or actively range joint secondary to pain.  Neurovascular intact distally. Neurologic:  MAE spontaneously. No gross focal neurologic deficits are appreciated.  Skin:  Skin is warm, dry and intact. No rash noted. Psychiatric: Mood and affect are normal. Speech and behavior are normal.    ED Results / Procedures /  Treatments   Labs (all labs ordered are listed, but only abnormal results are displayed) Labs Reviewed  CBC WITH DIFFERENTIAL/PLATELET - Abnormal; Notable for the following components:      Result Value   RBC 3.19 (*)    Hemoglobin 9.5 (*)    HCT 30.2 (*)    RDW 16.0 (*)    All other components within normal limits  BASIC METABOLIC PANEL - Abnormal; Notable for the following components:   Sodium 130 (*)    Chloride 96 (*)    Glucose, Bld 111 (*)    Calcium 8.3 (*)    All other components within normal limits  SYNOVIAL CELL COUNT + DIFF, W/ CRYSTALS - Abnormal; Notable for the following components:   Color, Synovial YELLOW (*)    Appearance-Synovial CLOUDY (*)    WBC, Synovial 72,482 (*)    All other components within normal limits  BODY FLUID CULTURE W GRAM STAIN  LACTIC ACID, PLASMA  LACTIC ACID, PLASMA  GLUCOSE, RANDOM  GLUCOSE, BODY FLUID OTHER            LD, BODY FLUID (OTHER)  HIV ANTIBODY (  ROUTINE TESTING W REFLEX)     EKG     RADIOLOGY Please see ED Course for my review and interpretation.  I personally reviewed all radiographic images ordered to evaluate for the above acute complaints and reviewed radiology reports and findings.  These findings were personally discussed with the patient.  Please see medical record for radiology report.    PROCEDURES:  Critical Care performed: No  .Joint Aspiration/Arthrocentesis  Date/Time: 09/03/2022 1:14 PM  Performed by: Merlyn Lot, MD Authorized by: Merlyn Lot, MD   Consent:    Consent obtained:  Verbal Location:    Location:  Knee   Knee:  L knee Anesthesia:    Anesthesia method:  Local infiltration   Local anesthetic:  Lidocaine 1% w/o epi Procedure details:    Preparation: Patient was prepped and draped in usual sterile fashion     Needle gauge:  20 G   Approach:  Lateral   Aspirate amount:  20   Aspirate characteristics:  Purulent and cloudy   Steroid injected: no     Specimen  collected: yes   Post-procedure details:    Dressing:  Adhesive bandage   Procedure completion:  Tolerated    MEDICATIONS ORDERED IN ED: Medications  morphine (PF) 4 MG/ML injection 4 mg (4 mg Intravenous Given 09/03/22 1343)  ceFEPIme (MAXIPIME) 2 g in sodium chloride 0.9 % 100 mL IVPB (has no administration in time range)  acetaminophen (TYLENOL) tablet 650 mg (has no administration in time range)    Or  acetaminophen (TYLENOL) suppository 650 mg (has no administration in time range)  ondansetron (ZOFRAN) tablet 4 mg (has no administration in time range)    Or  ondansetron (ZOFRAN) injection 4 mg (has no administration in time range)  heparin injection 5,000 Units (has no administration in time range)  senna-docusate (Senokot-S) tablet 1 tablet (has no administration in time range)  lidocaine (PF) (XYLOCAINE) 1 % injection 5 mL (5 mLs Intradermal Given by Other 09/03/22 1257)  ondansetron (ZOFRAN) injection 4 mg (4 mg Intravenous Given 09/03/22 1344)     IMPRESSION / MDM / [facility] / ED COURSE  I reviewed the triage vital signs and the nursing notes.                              Differential diagnosis includes, but is not limited to, gout, septic arthritis, inflammatory arthritis, fracture, cellulitis, DVT  Patient presenting to the ER for evaluation of symptoms as described above.  Based on symptoms, risk factors and considered above differential, this presenting complaint could reflect a potentially life-threatening illness therefore the patient will be placed on continuous pulse oximetry and telemetry for monitoring.  Laboratory evaluation will be sent to evaluate for the above complaints.  X-ray left knee on my review and interpretation without evidence of fracture.  Will plan arthrocentesis.  No sign of overlying cellulitis.    Clinical Course as of 09/03/22 1509  Fri Sep 03, 2022  1312 Patient without leukocytosis or lactate.  She is not septic but arthrocentesis  with drew 20 cc of green purulent cloudy joint fluid. [PR]  1448 Synovial cell count is concerning for septic arthritis.  Will consult orthopedics. [PR]  1501 Case in consultation with Dr. Harlow Mares of Ortho will plan to take 2 OR for washout.  Have ordered IV antibiotics.  Will consult hospitalist for admission. [PR]    Clinical Course User Index [PR] Merlyn Lot, MD  FINAL CLINICAL IMPRESSION(S) / ED DIAGNOSES   Final diagnoses:  Pyogenic arthritis of left knee joint, due to unspecified organism [facility])     Rx / DC Orders   ED Discharge Orders     None        Note:  This document was prepared using Dragon voice recognition software and may include unintentional dictation errors.    Merlyn Lot, MD 09/03/22 [id number]

## 2022-09-03 NOTE — Assessment & Plan Note (Signed)
-   Appears mild and asymptomatic - Sodium chloride 75 mL/h, 1 day ordered - Repeat BMP in a.m.

## 2022-09-03 NOTE — Assessment & Plan Note (Addendum)
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  Currently on salvage therapy with daratumumab  lenalidomide [15 mg 3 w; 1-w] dexamethasone.  December 2023-lambda light chain 256; M protein 0.8-rising  # Hold current therapy given acute issues see below; and also discussed with patient and daughter that patient's multiple myeloma labs getting worse concerning for progression/refractory disease.  Once acute issues resolved-patient will need repeat bone marrow biopsy; imaging with PET scan.   # Left knee pain/swelling/fever- [last 3 days]- ? Septic arthritis/immunocompromised host.  Discussed the need for arthrocentesis and possible need for IV antibiotics.  Recommend evaluation emergency room.  Patient received oxycodone 5 mg x 1 prior to leaving the clinic.  # Iatrogenic hypothyroidism [ Graves/goiter s/p total thyroidectomy;aug,2022 ]-currently on Synthroid 100 mcg/day-September 2023 TSH normal.  Monitor closely.  # Worsening lower back discomfort x 6 months-again reminded patient to get bone survey.  # Muscle cramps-?  Revlimid. improved  On tramadol q 12 hours prn-Refill tramadol.await above workup  # Chronic diastolic CHF-clinically-await above workup  # Hypokalemia- K 3.2 recommend compliance with Kdur BID.  Stable  # Bone lesions /last zometa on 11/27/2017.  Holding Zometa secondary to poor dentition.  Dental extraction appointment appt-on Jan 26th.   # Insomnia- recommend restart melatonin 1 to 2 mg nightly as needed.   STABLE.   # IV access: mediport- functional  :MM panel; K/l light chain ratio q 4 W  # Vaccination: Flu shot s/p;  recommend COVID-  6 w- given dental extrac- on 1/26.   # DISPOSITION:  # ER today # HOLD Dara today #  follow up in 2-3 weeks- MD; labs- cbc/cmp- Dr.B

## 2022-09-03 NOTE — Assessment & Plan Note (Addendum)
-   Left leg swelling with calf tenderness, started 3 days ago inpatient with multiple myeloma - Ultrasound of the left lower extremity ordered to assess for DVT

## 2022-09-03 NOTE — Progress Notes (Signed)
Pt woke up this morning with leg pain and increased swelling in lower left leg and knee.  Pt reports pain 8 on pain scale.  Pt reports she is not taking lasix regularly, reports hasn't taken in about a month because of increased urine frequency. Pt reports she is taking spironolactone 69m daily.  Pt states needs refill on wellbutrin.  Pt temp is 101 today, pt denies feeling bad with exception to left leg.

## 2022-09-03 NOTE — Progress Notes (Signed)
[facility] OFFICE PROGRESS NOTE  Patient Care Team: Marguerita Merles, MD as PCP - General (Family Medicine) End, Harrell Gave, MD as PCP - Cardiology (Cardiology) [name], [name] [name], MD as Consulting Physician (Hematology and Oncology) Ottie Glazier, MD as Consulting Physician (Pulmonary Disease) Gabriel Carina Betsey Holiday, MD as Consulting Physician (Endocrinology)   Cancer Staging  No matching staging information was found for the patient.   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 No morphologic support for residual myeloma disease. Negative for minimal residual disease by MM-MRD flow cytometry; HOLD REVLIMID [leg swelling]; AUG 2021-PET scan negative for myeloma; continue to hold Revlimid  # MARCH 2021-diastolic congestive heart failure [Dr.End]  # BMBx- OCT 2021- BMBx- 5% plasmacytosis-however this appears to be more polyclonal rather than monoclonal-not explain patient worsening anemia.   # OCT 2022- 18th- Dara- Rev-Dex  #November 2021-hyperthyroidism/goiter-  [D.Bennett/Dr.Solum]-methimazole. S/p Thyroidectomy [Dr.Kim; [facility]-AUG [id number] --------------------------  # 12/12- RIGHT JUGULAR DVT-x 396mn xarelto; finished April 2020; September 2020-EGD/dysphagia; Dr. AnVicente Males# Acute renal failure [Dr.Singh; Proteinuria 1.5gm/day ]; acyclovir/Asprin ------------------------------------------------------------------------------------------------------------   DIAGNOSIS: [ ]$  MULTIPLE MYELOMA  STAGE: III/HIGH RISK ;GOALS: CONTROL      [place] achieved [place] 11/21/2017 - 04/28/2018 Chemotherapy   Patient is on Treatment Plan : MYELOMA SALVAGE Cyclophosphamide / Carfilzomib / Dexamethasone (CCd) q28d     05/12/2021 - 02/25/2022 Chemotherapy   Patient is on Treatment Plan : MYELOMA RELAPSED REFRACTORY Daratumumab SQ + Lenalidomide + Dexamethasone (DaraRd) q28d     05/12/2021 -  Chemotherapy   Patient is on Treatment Plan : MYELOMA RELAPSED REFRACTORY Daratumumab SQ + Lenalidomide + Dexamethasone (DaraRd) q28d      INTERVAL HISTORY: Ambulating independently.  Alone.  Brandi Ren81.o.  female pleasant patient above history of relapsed multiple myeloma--currently on daratumumab-Revlimid-dexamethasone  is here for follow-up.  Pt woke up this morning with leg pain and increased swelling in lower left leg and knee. Pt reports pain 8 on pain scale.   Pt reports she is not taking lasix regularly, reports hasn't taken in about a month because of increased urine frequency. Pt reports she is taking spironolactone 259maily.   Pt temp is 101 today.  Denies any cough denies any chills.  Denies any nausea or vomiting.   Review of Systems  Constitutional:  Positive for malaise/fatigue. Negative for chills, diaphoresis and fever.  HENT:  Negative for nosebleeds and sore throat.   Eyes:  Negative for double vision.  Respiratory:  Negative for hemoptysis.   Cardiovascular:  Negative for chest pain, palpitations and orthopnea.   Gastrointestinal:  Negative for abdominal pain, blood in stool, constipation, heartburn and melena.  Genitourinary:  Negative for dysuria, frequency  and urgency.  Musculoskeletal:  Positive for back pain.  Skin: Negative.  Negative for itching and rash.  Neurological:  Negative for dizziness, tingling, focal weakness, weakness and headaches.  Endo/Heme/Allergies:  Does not bruise/bleed easily.  Psychiatric/Behavioral:  Negative for depression. The patient is not nervous/anxious.     PAST MEDICAL HISTORY :  Past Medical History:  Diagnosis Date   (HFpEF) heart failure with preserved ejection fraction ([place])    a. 06/2020 Echo: EF 60-65%, no rwma, nl RV size/fxn. Triv MR. Triv TR/AI. Mod elev PASP.   Anxiety    COPD (chronic obstructive pulmonary disease) (HCC)    GERD (gastroesophageal reflux disease)    Hypertension    Hyperthyroidism    Hypokalemia    IgA myeloma (HCC)    Multiple myeloma (HCC)    Pneumonia    Red blood cell antibody positive, compatible PRBC difficult to obtain    S/P autologous bone marrow transplantation ([place])     PAST SURGICAL HISTORY :   Past Surgical History:  Procedure Laterality Date   ANTERIOR VITRECTOMY Left 10/07/2021   Procedure: ANTERIOR VITRECTOMY;  Surgeon: Leandrew Koyanagi, MD;  Location: [facility];  Service: Ophthalmology;  Laterality: Left;   CATARACT EXTRACTION W/PHACO Left 10/07/2021   Procedure: CATARACT EXTRACTION PHACO AND INTRAOCULAR LENS PLACEMENT ([place]) LEFT VISION BLUE;  Surgeon: Leandrew Koyanagi, MD;  Location: [facility];  Service: Ophthalmology;  Laterality: Left;  kit for manual incision 14.56 01:21.4   CATARACT EXTRACTION W/PHACO Right 10/21/2021   Procedure: CATARACT EXTRACTION PHACO AND INTRAOCULAR LENS PLACEMENT ([facility]) RIGHT 11.12 01:48.1;  Surgeon: Leandrew Koyanagi, MD;  Location: [facility];  Service: Ophthalmology;  Laterality: Right;   ESOPHAGOGASTRODUODENOSCOPY (EGD) WITH PROPOFOL N/A  03/30/2019   Procedure: ESOPHAGOGASTRODUODENOSCOPY (EGD) WITH PROPOFOL;  Surgeon: Jonathon Bellows, MD;  Location: [facility] END[facility];  Service: Gastroenterology;  Laterality: N/A;   IR FLUORO GUIDE PORT INSERTION RIGHT  12/16/2017   IR IMAGING GUIDED PORT INSERTION  06/15/2021   THYROIDECTOMY N/A     FAMILY HISTORY :   Family History  Problem Relation Age of Onset   Pneumonia Mother    Seizures Father     SOCIAL HISTORY:   Social History   Tobacco Use   Smoking status: Former    Packs/day: 0.25    Types: Cigarettes    Quit date: 03/18/2021    Years since quitting: 1.4   Smokeless tobacco: Never   Tobacco comments:    2 cigarettes QOD  Vaping Use   Vaping Use: Never used  Substance Use Topics   Alcohol use: No   Drug use: No    ALLERGIES:  has No Known Allergies.  MEDICATIONS:  No current facility-administered medications for this visit.   Current Outpatient Medications  Medication Sig Dispense Refill   acyclovir (ZOVIRAX) 400 MG tablet Take 1 tablet (400 mg total) by mouth 2 (two) times daily. 60 tablet 3   albuterol (VENTOLIN HFA) 108 (90 Base) MCG/ACT inhaler Inhale 2 puffs into the lungs every 6 (six) hours as needed for wheezing or shortness of breath. 8 g 3   aspirin EC 81 MG EC tablet Take 1 tablet (81 mg total) by mouth daily. 90 tablet 3   benzonatate (TESSALON) 100 MG capsule Take 1 capsule (100 mg total) by mouth 2 (two) times daily as needed for cough. 40 capsule 0   buPROPion (WELLBUTRIN) 75 MG tablet Take 75 mg by mouth daily.     calcium-vitamin D (OSCAL WITH D) 500MG-200UNIT (5MCG) tablet  Take 1 tablet by mouth 2 (two) times daily. 60 tablet 0   carvedilol (COREG) 3.125 MG tablet Take 1 tablet (3.125 mg total) by mouth 2 (two) times daily with a meal. 60 tablet 5   dexamethasone (DECADRON) 4 MG tablet Take dexamethasone 20 mg [5 pills] weekly on Thursdays. # Do not take dexamethasone in the week of chemotherapy. Take in the mornings 60 tablet 4   Ensure Plus  (ENSURE PLUS) LIQD Take 237 mLs by mouth 3 (three) times a week.     lenalidomide (REVLIMID) 15 MG capsule Take 1 capsule (15 mg total) by mouth daily. Take for 21 days, then hold for 7 days. Repeat every 28 days. 21 capsule 0   levothyroxine (SYNTHROID) 100 MCG tablet TAKE 1 TABLET BY MOUTH BEFORE BREAKFAST 90 tablet 0   lidocaine-prilocaine (EMLA) cream Apply 1 application topically as needed. Apply to port and cover with saran wrap 1-2 hours prior to port access 30 g 1   magnesium oxide (MAG-OX) 400 (240 Mg) MG tablet Take 1 tablet (400 mg total) by mouth 2 (two) times daily. 60 tablet 3   montelukast (SINGULAIR) 10 MG tablet Take 1 tablet (10 mg total) by mouth at bedtime. 30 tablet 3   ondansetron (ZOFRAN) 8 MG tablet One pill every 8 hours as needed for nausea/vomitting. 40 tablet 1   pantoprazole (PROTONIX) 40 MG tablet Take 1 tablet (40 mg total) by mouth daily. 90 tablet 0   polyethylene glycol (MIRALAX / GLYCOLAX) packet Take 17 g by mouth daily as needed for mild constipation. 14 each 0   potassium chloride SA (KLOR-CON M) 20 MEQ tablet Take 1 tablet (20 mEq total) by mouth 2 (two) times daily. 60 tablet 6   spironolactone (ALDACTONE) 25 MG tablet Take 1 tablet by mouth once daily 90 tablet 3   traMADol (ULTRAM) 50 MG tablet TAKE 1 TABLET BY MOUTH EVERY 12 HOURS AS NEEDED 60 tablet 0   Vitamin D, Ergocalciferol, (DRISDOL) 1.25 MG (50000 UNIT) CAPS capsule Take 1 capsule by mouth once a week 12 capsule 0   furosemide (LASIX) 40 MG tablet Take 1 tablet (40 mg total) by mouth daily. (Patient not taking: Reported on 09/03/2022) 90 tablet 3   loperamide (IMODIUM) 2 MG capsule Take 2 mg by mouth as needed for diarrhea or loose stools. (Patient not taking: Reported on 09/03/2022)     Facility-Administered Medications Ordered in Other Visits  Medication Dose Route Frequency Provider Last Rate Last Admin   0.9 %  sodium chloride infusion   Intravenous Continuous Monia Sabal, PA-C         PHYSICAL EXAMINATION:   BP 132/69 (BP Location: Left Arm, Patient Position: Sitting)   Pulse 85   Temp (!) 101 F (38.3 C) (Tympanic)   Resp 16   SpO2 100%   Filed Weights   Patient in wheelchair.  Accompanied by daughter.  Left knee swelling/warmth and erythema noted.  Positive for tenderness.  Physical Exam HENT:     Head: Normocephalic and atraumatic.  Eyes:     Pupils: Pupils are equal, round, and reactive to light.  Cardiovascular:     Rate and Rhythm: Normal rate and regular rhythm.  Pulmonary:     Effort: Pulmonary effort is normal. No respiratory distress.     Breath sounds: Normal breath sounds. No wheezing.  Abdominal:     General: Bowel sounds are normal. There is no distension.     Palpations: Abdomen is soft. There is no  mass.     Tenderness: There is no abdominal tenderness. There is no guarding or rebound.  Musculoskeletal:        General: No tenderness. Normal range of motion.     Cervical back: Normal range of motion and neck supple.  Skin:    General: Skin is warm.  Neurological:     Mental Status: She is alert and oriented to person, place, and time.  Psychiatric:        Mood and Affect: Affect normal.    LABORATORY DATA:  I have reviewed the data as listed    Component Value Date/Time   NA 130 (L) 09/03/2022 1147   NA 141 01/25/2020 1530   K 3.5 09/03/2022 1147   CL 96 (L) 09/03/2022 1147   CO2 22 09/03/2022 1147   GLUCOSE 111 (H) 09/03/2022 1147   BUN 9 09/03/2022 1147   BUN 19 01/25/2020 1530   CREATININE 0.90 09/03/2022 1147   CALCIUM 8.3 (L) 09/03/2022 1147   PROT 8.8 (H) 09/03/2022 0947   ALBUMIN 3.5 09/03/2022 0947   AST 21 09/03/2022 0947   ALT 20 09/03/2022 0947   ALKPHOS 92 09/03/2022 0947   BILITOT 0.7 09/03/2022 0947   GFRNONAA >60 09/03/2022 1147   GFRAA NOT CALCULATED 04/17/2020 1352    No results found for: "SPEP", "UPEP"  Lab Results  Component Value Date   WBC 10.0 09/03/2022   NEUTROABS 7.0 09/03/2022    HGB 9.5 (L) 09/03/2022   HCT 30.2 (L) 09/03/2022   MCV 94.7 09/03/2022   PLT 170 09/03/2022      Chemistry      Component Value Date/Time   NA 130 (L) 09/03/2022 1147   NA 141 01/25/2020 1530   K 3.5 09/03/2022 1147   CL 96 (L) 09/03/2022 1147   CO2 22 09/03/2022 1147   BUN 9 09/03/2022 1147   BUN 19 01/25/2020 1530   CREATININE 0.90 09/03/2022 1147      Component Value Date/Time   CALCIUM 8.3 (L) 09/03/2022 1147   ALKPHOS 92 09/03/2022 0947   AST 21 09/03/2022 0947   ALT 20 09/03/2022 0947   BILITOT 0.7 09/03/2022 0947      (H): Data is abnormally high   Latest Reference Range & Units Most Recent 12/24/20 14:01 04/01/21 14:34 04/28/21 09:17 06/11/21 09:42 07/03/21 10:32 08/20/21 10:06  M Protein SerPl Elph-Mcnc Not Observed g/dL 1.1 (H) (C) 08/20/21 10:06 Not Observed (C) 0.6 (H) (C) 0.7 (H) (C) 0.5 (H) (C) 0.7 (H) (C) 1.1 (H) (C)    Latest Reference Range & Units Most Recent 09/17/21 08:47 10/15/21 08:25 11/12/21 08:55  Kappa free light chain 3.3 - 19.4 mg/L 5.0 11/12/21 08:55 7.2 7.2 5.0  Lambda free light chains 5.7 - 26.3 mg/L 223.6 (H) 11/12/21 08:55 215.3 (H) 340.2 (H) 223.6 (H)  Kappa, lambda light chain ratio 0.26 - 1.65  0.02 (L) 11/12/21 08:55 0.03 (L) 0.02 (L) 0.02 (L)  (H): Data is abnormally high (L): Data is abnormally low  RADIOGRAPHIC STUDIES: I have personally reviewed the radiological images as listed and agreed with the findings in the report. DG Knee Complete 4 Views Left  Result Date: 09/03/2022 CLINICAL DATA:  Left knee pain. Evaluate for fracture and infection. EXAM: LEFT KNEE - COMPLETE 4+ VIEW COMPARISON:  Bone survey 04/08/2017 FINDINGS: Left knee is located with a moderate sized suprapatellar joint effusion. Mild medial joint space narrowing. Osteophytosis in the medial knee compartment and patellofemoral compartment. Old fracture involving the proximal fibula.  Atherosclerotic calcifications in the left lower extremity. IMPRESSION: 1. No acute  bone abnormality in the left knee. 2. Moderate-sized joint effusion. 3. 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 4. Old fracture involving the proximal fibula. Electronically Signed   By: Markus Daft M.D.   On: 09/03/2022 12:50     ASSESSMENT & PLAN:  Multiple myeloma not having achieved remission ([place]) # RECURRENT Multiple myeloma stage III [high-risk cytogenetics-status post KRD-VGPR ; status post autologous stem cell transplant on 06/15/18.  Currently on salvage therapy with daratumumab  lenalidomide [15 mg 3 w; 1-w] dexamethasone.  December 2023-lambda light chain 256; M protein 0.8-rising  # Hold current therapy given acute issues see below; and also discussed with patient and daughter that patient's multiple myeloma labs getting worse concerning for progression/refractory disease.  Once acute issues resolved-patient will need repeat bone marrow biopsy; imaging with PET scan.   # Left knee pain/swelling/fever- [last 3 days]- ? Septic arthritis/immunocompromised host.  Discussed the need for arthrocentesis and possible need for IV antibiotics.  Recommend evaluation emergency room.  Patient received oxycodone 5 mg x 1 prior to leaving the clinic.  # Iatrogenic hypothyroidism [ Graves/goiter s/p total thyroidectomy;aug,2022 ]-currently on Synthroid 100 mcg/day-September 2023 TSH normal.  Monitor closely.  # Worsening lower back discomfort x 6 months-again reminded patient to get bone survey.  # Muscle cramps-?  Revlimid. improved  On tramadol q 12 hours prn-Refill tramadol.await above workup  # Chronic diastolic CHF-clinically-await above workup  # Hypokalemia- K 3.2 recommend compliance with Kdur BID.  Stable  # Bone lesions /last zometa on 11/27/2017.  Holding Zometa secondary to poor dentition.  Dental extraction appointment appt-on Jan 26th.   # Insomnia- recommend restart melatonin 1 to 2 mg nightly as needed.   STABLE.   # IV access: mediport- functional  :MM panel; K/l light chain ratio  q 4 W  # Vaccination: Flu shot s/p;  recommend COVID-  6 w- given dental extrac- on 1/26.   # DISPOSITION:  # ER today # HOLD Dara today #  follow up in 2-3 weeks- MD; labs- cbc/cmp- Dr.B     Orders Placed This Encounter  Procedures   CBC with Differential    Standing Status:   Future    Standing Expiration Date:   09/03/2023   Comprehensive metabolic panel    Standing Status:   Future    Standing Expiration Date:   09/03/2023   CBC with Differential    Standing Status:   Future    Standing Expiration Date:   09/03/2023   Comprehensive metabolic panel    Standing Status:   Future    Standing Expiration Date:   09/03/2023     All questions were answered. The patient knows to call the clinic with any problems, questions or concerns.      [name], MD 09/03/2022 1:01 PM

## 2022-09-03 NOTE — Consult Note (Signed)
ORTHOPAEDIC CONSULTATION  REQUESTING PHYSICIAN: Cox, Amy N, DO  Chief Complaint: left knee pain  HPI: Brandi [name] is a 72 y.o. female who complains of left knee pain that has been increasing. She has a PMH including multiple myeloma on chemotherapy. The pain is sharp in character. The pain is severe and 10/10. The pain is worse with movement and better with rest. Denies any numbness, tingling or constitutional symptoms.  Past Medical History:  Diagnosis Date   (HFpEF) heart failure with preserved ejection fraction ([place])    a. 06/2020 Echo: EF 60-65%, no rwma, nl RV size/fxn. Triv MR. Triv TR/AI. Mod elev PASP.   Anxiety    COPD (chronic obstructive pulmonary disease) (HCC)    GERD (gastroesophageal reflux disease)    Hypertension    Hyperthyroidism    Hypokalemia    IgA myeloma (HCC)    Multiple myeloma (HCC)    Pneumonia    Red blood cell antibody positive, compatible PRBC difficult to obtain    S/P autologous bone marrow transplantation [facility])    Past Surgical History:  Procedure Laterality Date   ANTERIOR VITRECTOMY Left 10/07/2021   Procedure: ANTERIOR VITRECTOMY;  Surgeon: Leandrew Koyanagi, MD;  Location: [place];  Service: Ophthalmology;  Laterality: Left;   CATARACT EXTRACTION W/PHACO Left 10/07/2021   Procedure: CATARACT EXTRACTION PHACO AND INTRAOCULAR LENS PLACEMENT ([place]) LEFT VISION BLUE;  Surgeon: Leandrew Koyanagi, MD;  Location: [place];  Service: Ophthalmology;  Laterality: Left;  kit for manual incision 14.56 01:21.4   CATARACT EXTRACTION W/PHACO Right 10/21/2021   Procedure: CATARACT EXTRACTION PHACO AND INTRAOCULAR LENS PLACEMENT ([facility]) RIGHT 11.12 01:48.1;  Surgeon: Leandrew Koyanagi, MD;  Location: [place];  Service: Ophthalmology;  Laterality: Right;   ESOPHAGOGASTRODUODENOSCOPY (EGD) WITH PROPOFOL N/A 03/30/2019   Procedure: ESOPHAGOGASTRODUODENOSCOPY (EGD) WITH PROPOFOL;  Surgeon: Jonathon Bellows, MD;  [facility] ENDOSCOPY;  Service: Gastroenterology;  Laterality: N/A;   IR FLUORO GUIDE PORT INSERTION RIGHT  12/16/2017   IR IMAGING GUIDED PORT INSERTION  06/15/2021   THYROIDECTOMY N/A    Social History   Socioeconomic History   Marital status: Single    Spouse name: Not on file   Number of children: 3   Years of education: Not on file   Highest education level: Not on file  Occupational History   Not on file  Tobacco Use   Smoking status: Former    Packs/day: 0.25    Types: Cigarettes    Quit date: 03/18/2021    Years since quitting: 1.4   Smokeless tobacco: Never   Tobacco comments:    2 cigarettes QOD  Vaping Use   Vaping Use: Never used  Substance and Sexual Activity   Alcohol use: No   Drug use: No   Sexual activity: Not Currently  Other Topics Concern   Not on file  Social History Narrative   Lives at home with family(son): daughter 8 blocks away. States "I'm never alone." Independent at baseline   Social Determinants of Radio broadcast assistant Strain: Not on file  Food Insecurity: Not on file  Transportation Needs: Not on file  Physical Activity: Not on file  Stress: Not on file  Social Connections: Not on file   Family History  Problem Relation Age of Onset   Pneumonia Mother    Seizures Father    No Known Allergies Prior to Admission medications   Medication Sig Start Date End Date Taking? Authorizing Provider  acyclovir (ZOVIRAX) 400 MG tablet Take  1 tablet (400 mg total) by mouth 2 (two) times daily. 04/28/21  Yes [name], MD  albuterol (VENTOLIN HFA) 108 (90 Base) MCG/ACT inhaler Inhale 2 puffs into the lungs every 6 (six) hours as needed for wheezing or shortness of breath. 02/25/22  Yes Hughie Closs, PA-C  aspirin EC 81 MG EC tablet Take 1 tablet (81 mg total) by mouth daily. 01/25/20  Yes Loel Dubonnet, NP  buPROPion (WELLBUTRIN) 75 MG tablet Take 75 mg by mouth daily. 03/24/21  Yes [provider]  calcium-vitamin D  (OSCAL WITH D) 500MG-200UNIT (5MCG) tablet Take 1 tablet by mouth 2 (two) times daily. 05/18/21  Yes Borders, Kirt Boys, NP  carvedilol (COREG) 3.125 MG tablet Take 1 tablet (3.125 mg total) by mouth 2 (two) times daily with a meal. 11/27/21  Yes Furth, Cadence H, PA-C  dexamethasone (DECADRON) 4 MG tablet Take dexamethasone 20 mg [5 pills] weekly on Thursdays. # Do not take dexamethasone in the week of chemotherapy. Take in the mornings 04/22/22  Yes [name], MD  levothyroxine (SYNTHROID) 100 MCG tablet TAKE 1 TABLET BY MOUTH BEFORE BREAKFAST 05/11/22  Yes [name], MD  magnesium oxide (MAG-OX) 400 (240 Mg) MG tablet Take 1 tablet (400 mg total) by mouth 2 (two) times daily. 09/17/21  Yes [name], MD  montelukast (SINGULAIR) 10 MG tablet Take 1 tablet (10 mg total) by mouth at bedtime. 04/22/22  Yes [name], MD  ondansetron (ZOFRAN) 8 MG tablet One pill every 8 hours as needed for nausea/vomitting. 04/28/21  Yes [name], MD  pantoprazole (PROTONIX) 40 MG tablet Take 1 tablet (40 mg total) by mouth daily. 02/25/22 09/03/22 Yes Covington, Sarah M, PA-C  potassium chloride SA (KLOR-CON M) 20 MEQ tablet Take 1 tablet (20 mEq total) by mouth 2 (two) times daily. 02/25/22  Yes Covington, Sarah M, PA-C  spironolactone (ALDACTONE) 25 MG tablet Take 1 tablet by mouth once daily 04/14/22  Yes End, Harrell Gave, MD  traMADol (ULTRAM) 50 MG tablet TAKE 1 TABLET BY MOUTH EVERY 12 HOURS AS NEEDED 08/27/22  Yes [name], MD  Vitamin D, Ergocalciferol, (DRISDOL) 1.25 MG (50000 UNIT) CAPS capsule Take 1 capsule by mouth once a week 07/12/22  Yes Covington, Sarah M, PA-C  benzonatate (TESSALON) 100 MG capsule Take 1 capsule (100 mg total) by mouth 2 (two) times daily as needed for cough. 06/25/22   [name], MD  furosemide (LASIX) 40 MG tablet Take 1 tablet (40 mg total) by mouth daily. 10/02/21   Furth, Cadence H, PA-C  lenalidomide (REVLIMID)  15 MG capsule Take 1 capsule (15 mg total) by mouth daily. Take for 21 days, then hold for 7 days. Repeat every 28 days. 08/13/22   [name], MD  lidocaine-prilocaine (EMLA) cream Apply 1 application topically as needed. Apply to port and cover with saran wrap 1-2 hours prior to port access 07/03/21   [name], MD  polyethylene glycol (MIRALAX / GLYCOLAX) packet Take 17 g by mouth daily as needed for mild constipation. 04/11/17   Nicholes Mango, MD   DG Knee Complete 4 Views Left  Result Date: 09/03/2022 CLINICAL DATA:  Left knee pain. Evaluate for fracture and infection. EXAM: LEFT KNEE - COMPLETE 4+ VIEW COMPARISON:  Bone survey 04/08/2017 FINDINGS: Left knee is located with a moderate sized suprapatellar joint effusion. Mild medial joint space narrowing. Osteophytosis in the medial knee compartment and patellofemoral compartment. Old fracture involving  the proximal fibula. Atherosclerotic calcifications in the left lower extremity. IMPRESSION: 1. No acute bone abnormality in the left knee. 2. Moderate-sized joint effusion. 3. 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 4. Old fracture involving the proximal fibula. Electronically Signed   By: Markus Daft M.D.   On: 09/03/2022 12:50    Positive ROS: All other systems have been reviewed and were otherwise negative with the exception of those mentioned in the HPI and as above.  Physical Exam: General: Alert, no acute distress Cardiovascular: No pedal edema Respiratory: No cyanosis, no use of accessory musculature GI: No organomegaly, abdomen is soft and non-tender Skin: No lesions in the area of chief complaint Neurologic: Sensation intact distally Psychiatric: Patient is competent for consent with normal mood and affect Lymphatic: No axillary or cervical lymphadenopathy  MUSCULOSKELETAL: large effusion, tender throughout, +warmth. Compartments soft. Good cap refill. Motor and sensory intact distally.  Assessment: Left knee septic  arthritis   Plan: Plan arthroscopic knee irrigation and debridement.  The diagnosis, risks, benefits and alternatives to treatment are all discussed in detail with the patient and family. Risks include but are not limited to bleeding, infection, deep vein thrombosis, pulmonary embolism, nerve or vascular injury, non-union, repeat operation, persistent pain, weakness, stiffness and death. She understands and is eager to proceed.     Lovell Sheehan, MD    09/03/2022 4:05 PM

## 2022-09-03 NOTE — Assessment & Plan Note (Signed)
Patient is not in acute exacerbation - DuoNebs every 6 hours as needed for shortness of breath and wheezing

## 2022-09-03 NOTE — Consult Note (Signed)
Pharmacy Antibiotic Note  Brandi [name] is a 72 y.o. female with PMH including multiple myeloma on chemotherapy admitted on 09/03/2022 with  septic arthritis of left knee .  Pharmacy has been consulted for vancomycin and cefepime dosing.  Plan:  Cefepime 2 g IV q12h  Vancomycin 1.75 g IV LD followed by maintenance regimen of vancomycin 1.25 g IV q24h --Calculated AUC: 525, Cmin: 13.4 --Vd 0.72 (BMI 31 - borderline), Scr 0.9 --Daily Scr per protocol --Levels at steady state or as clinically indicated  Height: 5' 2"$  (157.5 cm) Weight: 77.6 kg (171 lb) IBW/kg (Calculated) : 50.1  Temp (24hrs), Avg:100.3 F (37.9 C), Min:99.6 F (37.6 C), Max:101 F (38.3 C)  Recent Labs  Lab 09/03/22 0947 09/03/22 1147  WBC 10.7* 10.0  CREATININE 0.83 0.90  LATICACIDVEN  --  1.6    Estimated Creatinine Clearance: 56.1 mL/min (by C-G formula based on SCr of 0.9 mg/dL).    No Known Allergies  Antimicrobials this admission: Cefepime 2/9 >>  Vancomycin 2/9 >>   Dose adjustments this admission: N/A  Microbiology results: Lt knee synovial fluid: pending  Thank you for allowing pharmacy to be a part of this patient's care.  Benita Gutter 09/03/2022 3:31 PM

## 2022-09-03 NOTE — Assessment & Plan Note (Signed)
-   Levothyroxine 100 mcg daily resumed before breakfast

## 2022-09-03 NOTE — Assessment & Plan Note (Addendum)
Patient not in acute exacerbation - Strict I's and O's

## 2022-09-03 NOTE — ED Notes (Signed)
Dr. Quentin Cornwall at bedside aspirating left knee.

## 2022-09-03 NOTE — Anesthesia Preprocedure Evaluation (Signed)
Anesthesia Evaluation  Patient identified by MRN, date of birth, ID band Patient awake    Reviewed: Allergy & Precautions, NPO status , Patient's Chart, lab work & pertinent test results  History of Anesthesia Complications Negative for: history of anesthetic complications  Airway Mallampati: II  TM Distance: >3 FB Neck ROM: full    Dental no notable dental hx.    Pulmonary COPD,  COPD inhaler, former smoker   Pulmonary exam normal        Cardiovascular hypertension, On Medications and On Home Beta Blockers +CHF  Normal cardiovascular exam  Echo 2021 IMPRESSIONS     1. Left ventricular ejection fraction, by visual estimation, is 60 to  65%. The left ventricle has normal function. There is no left ventricular  hypertrophy.   2. The left ventricle has no regional wall motion abnormalities.   3. Global right ventricle has normal systolic function.The right  ventricular size is normal. No increase in right ventricular wall  thickness.   4. Left atrial size was normal.   5. Right atrial size was normal.   6. The mitral valve is normal in structure. Trivial mitral valve  regurgitation.   7. The tricuspid valve is normal in structure.   8. The tricuspid valve is normal in structure. Tricuspid valve  regurgitation is trivial.   9. The aortic valve is normal in structure. Aortic valve regurgitation is  trivial.  10. The pulmonic valve was normal in structure. Pulmonic valve  regurgitation is not visualized.  11. Moderately elevated pulmonary artery systolic pressure.     Neuro/Psych  PSYCHIATRIC DISORDERS Anxiety     negative neurological ROS     GI/Hepatic Neg liver ROS,GERD  Medicated,,  Endo/Other  Hypothyroidism    Renal/GU      Musculoskeletal   Abdominal   Peds  Hematology negative hematology ROS (+)   Anesthesia Other Findings Past Medical History: No date: (HFpEF) heart failure with preserved ejection  fraction ([place])     Comment:  a. 06/2020 Echo: EF 60-65%, no rwma, nl RV size/fxn.               Triv MR. Triv TR/AI. Mod elev PASP. No date: Anxiety No date: COPD (chronic obstructive pulmonary disease) (HCC) No date: GERD (gastroesophageal reflux disease) No date: Hypertension No date: Hyperthyroidism No date: Hypokalemia No date: IgA myeloma (HCC) No date: Multiple myeloma ([place]) No date: Pneumonia No date: Red blood cell antibody positive, compatible PRBC difficult  to obtain No date: S/P autologous bone marrow transplantation [facility])  Past Surgical History: 10/07/2021: ANTERIOR VITRECTOMY; Left     Comment:  Procedure: ANTERIOR VITRECTOMY;  Surgeon: Leandrew Koyanagi, MD;  Location: [place];  Service:               Ophthalmology;  Laterality: Left; 10/07/2021: CATARACT EXTRACTION W/PHACO; Left     Comment:  Procedure: CATARACT EXTRACTION PHACO AND INTRAOCULAR               LENS PLACEMENT ([place]) LEFT VISION BLUE;  Surgeon:               Leandrew Koyanagi, MD;  Location: [place];              Service: Ophthalmology;  Laterality: Left;  kit for               manual incision 14.56 01:21.4 10/21/2021: CATARACT EXTRACTION W/PHACO; Right  Comment:  Procedure: CATARACT EXTRACTION PHACO AND INTRAOCULAR               LENS PLACEMENT ([place]) RIGHT 11.12 01:48.1;  Surgeon:               Leandrew Koyanagi, MD;  Location: [place];              Service: Ophthalmology;  Laterality: Right; 03/30/2019: ESOPHAGOGASTRODUODENOSCOPY (EGD) WITH PROPOFOL; N/A     Comment:  Procedure: ESOPHAGOGASTRODUODENOSCOPY (EGD) WITH               PROPOFOL;  Surgeon: Jonathon Bellows, MD;  Location: [facility]               ENDOSCOPY;  Service: Gastroenterology;  Laterality: N/A; 12/16/2017: IR FLUORO GUIDE PORT INSERTION RIGHT 06/15/2021: IR IMAGING GUIDED PORT INSERTION No date: THYROIDECTOMY; N/A  BMI    Body Mass Index: 31.28 kg/m       Reproductive/Obstetrics negative OB ROS                             Anesthesia Physical Anesthesia Plan  ASA: 3  Anesthesia Plan: General ETT   Post-op Pain Management: Ofirmev IV (intra-op)* and Toradol IV (intra-op)*   Induction: Intravenous  PONV Risk Score and Plan: 3 and Ondansetron, Dexamethasone and Treatment may vary due to age or medical condition  Airway Management Planned: Oral ETT  Additional Equipment:   Intra-op Plan:   Post-operative Plan: Extubation in OR  Informed Consent: I have reviewed the patients History and Physical, chart, labs and discussed the procedure including the risks, benefits and alternatives for the proposed anesthesia with the patient or authorized representative who has indicated his/her understanding and acceptance.     Dental Advisory Given  Plan Discussed with: Anesthesiologist, CRNA and Surgeon  Anesthesia Plan Comments: (Patient consented for risks of anesthesia including but not limited to:  - adverse reactions to medications - damage to eyes, teeth, lips or other oral mucosa - nerve damage due to positioning  - sore throat or hoarseness - Damage to heart, brain, nerves, lungs, other parts of body or loss of life  Patient voiced understanding.)        Anesthesia Quick Evaluation

## 2022-09-04 ENCOUNTER — Other Ambulatory Visit: Payer: Self-pay

## 2022-09-04 ENCOUNTER — Encounter: Payer: Self-pay | Admitting: Orthopedic Surgery

## 2022-09-04 DIAGNOSIS — E038 Other specified hypothyroidism: Secondary | ICD-10-CM

## 2022-09-04 DIAGNOSIS — I10 Essential (primary) hypertension: Secondary | ICD-10-CM

## 2022-09-04 DIAGNOSIS — I5032 Chronic diastolic (congestive) heart failure: Secondary | ICD-10-CM

## 2022-09-04 DIAGNOSIS — K219 Gastro-esophageal reflux disease without esophagitis: Secondary | ICD-10-CM | POA: Diagnosis not present

## 2022-09-04 DIAGNOSIS — M7989 Other specified soft tissue disorders: Secondary | ICD-10-CM

## 2022-09-04 DIAGNOSIS — C9000 Multiple myeloma not having achieved remission: Secondary | ICD-10-CM

## 2022-09-04 DIAGNOSIS — J449 Chronic obstructive pulmonary disease, unspecified: Secondary | ICD-10-CM

## 2022-09-04 DIAGNOSIS — M009 Pyogenic arthritis, unspecified: Secondary | ICD-10-CM | POA: Diagnosis not present

## 2022-09-04 DIAGNOSIS — Z9481 Bone marrow transplant status: Secondary | ICD-10-CM

## 2022-09-04 DIAGNOSIS — E871 Hypo-osmolality and hyponatremia: Secondary | ICD-10-CM

## 2022-09-04 LAB — CBC
HCT: 26.5 % — ABNORMAL LOW (ref 36.0–46.0)
Hemoglobin: 8.4 g/dL — ABNORMAL LOW (ref 12.0–15.0)
MCH: 30.2 pg (ref 26.0–34.0)
MCHC: 31.7 g/dL (ref 30.0–36.0)
MCV: 95.3 fL (ref 80.0–100.0)
Platelets: 142 10*3/uL — ABNORMAL LOW (ref 150–400)
RBC: 2.78 MIL/uL — ABNORMAL LOW (ref 3.87–5.11)
RDW: 15.9 % — ABNORMAL HIGH (ref 11.5–15.5)
WBC: 7.6 10*3/uL (ref 4.0–10.5)
nRBC: 0 % (ref 0.0–0.2)

## 2022-09-04 LAB — BASIC METABOLIC PANEL
Anion gap: 9 (ref 5–15)
BUN: 8 mg/dL (ref 8–23)
CO2: 21 mmol/L — ABNORMAL LOW (ref 22–32)
Calcium: 7.7 mg/dL — ABNORMAL LOW (ref 8.9–10.3)
Chloride: 105 mmol/L (ref 98–111)
Creatinine, Ser: 0.84 mg/dL (ref 0.44–1.00)
GFR, Estimated: >60 mL/min (ref >60)
Glucose, Bld: 105 mg/dL — ABNORMAL HIGH (ref 70–99)
Potassium: 3.6 mmol/L (ref 3.5–5.1)
Sodium: 135 mmol/L (ref 135–145)

## 2022-09-04 LAB — HIV ANTIBODY (ROUTINE TESTING W REFLEX): HIV Screen 4th Generation wRfx: NONREACTIVE

## 2022-09-04 MED ORDER — OXYCODONE HCL 5 MG PO TABS
10.0000 mg | ORAL_TABLET | ORAL | Status: DC | PRN
  Administered 2022-09-04 (×2): 10 mg via ORAL
  Filled 2022-09-04 (×2): qty 2

## 2022-09-04 MED ORDER — ENSURE ENLIVE PO LIQD
237.0000 mL | Freq: Three times a day (TID) | ORAL | Status: DC
  Administered 2022-09-04 – 2022-09-10 (×15): 237 mL via ORAL

## 2022-09-04 MED ORDER — ADULT MULTIVITAMIN W/MINERALS CH
1.0000 | ORAL_TABLET | Freq: Every day | ORAL | Status: DC
  Administered 2022-09-05 – 2022-09-10 (×6): 1 via ORAL
  Filled 2022-09-04 (×6): qty 1

## 2022-09-04 MED ORDER — CHLORHEXIDINE GLUCONATE CLOTH 2 % EX PADS
6.0000 | MEDICATED_PAD | Freq: Every day | CUTANEOUS | Status: DC
  Administered 2022-09-05 – 2022-09-10 (×6): 6 via TOPICAL

## 2022-09-04 MED ORDER — SODIUM CHLORIDE 0.9 % IV SOLN
2.0000 g | Freq: Three times a day (TID) | INTRAVENOUS | Status: DC
  Administered 2022-09-04 – 2022-09-05 (×3): 2 g via INTRAVENOUS
  Filled 2022-09-04 (×4): qty 12.5

## 2022-09-04 MED ORDER — MORPHINE SULFATE (PF) 2 MG/ML IV SOLN: 2.0000 mg | INTRAVENOUS | Status: DC | PRN

## 2022-09-04 NOTE — Progress Notes (Signed)
PROGRESS NOTE    Brandi [name]   [id number] DOB: Oct 02, 1950  DOA: 09/03/2022 Date of Service: 09/04/22 PCP: Brandi Merles, MD     Brief Narrative / Hospital Course:  Brandi [name] is a 72 year old female with history of multiple myeloma, who presents from oncology clinic for chief concerns of 3 days of worsening left knee pain. 02/09: to ED, admitted to hospitalist service, to OR for arthroscopic joint washout w/ Dr. Harlow Mares, continued on vancomycin and cefepime pending joint fluid cultures. DVT ruled out.  02/10: PT recs for SNF but pt states she is not interested in this at this time. Awaiting Cx from joint aspirate. Pain control.   Consultants:  Orthopedics  Procedures: 09/03/2022 joint aspiration in ED  09/03/2022 arthroscopic L knee debridement       ASSESSMENT & PLAN:   Principal Problem:   Septic arthritis of knee, left ([place]) Active Problems:   Multiple myeloma not having achieved remission ([place])   Essential hypertension   S/P autologous bone marrow transplantation ([place])   Chronic heart failure with preserved ejection fraction (HFpEF) (HCC)   Hyponatremia   Hypothyroidism   COPD (chronic obstructive pulmonary disease) (HCC)   GERD (gastroesophageal reflux disease)   Leg swelling  Septic arthritis of knee, left (HCC) Leg swelling DVT r/o Body fluid from left knee aspirate culture pending  S/p joint washout Continue vancomycin and cefepime Symptomatic support: Acetaminophen 650 mg p.o. every 6 hours as needed for mild pain, fever, headache; ocyxodone started today 10 mg q4h prn mod-severe pain and morphine for breakthrough pain, will plan to taper down dose/frequency tomorrow.   Hypothyroidism Levothyroxine 100 mcg daily resumed before breakfast  Essential hypertension Coreg 3.125 mg p.o. twice daily resumed Hydralazine 5 mg every 8 hours as needed for SBP greater than 180, 4 days ordered  Chronic heart failure with preserved ejection fraction  (HFpEF) (HCC) Patient not in acute exacerbation Strict I's and O's  COPD (chronic obstructive pulmonary disease) ([place]) Patient is not in acute exacerbation DuoNebs every 6 hours as needed for shortness of breath and wheezing  Hyponatremia - resolved w /IV fluids  BMP in a.m.    DVT prophylaxis: heparin Pertinent IV fluids/nutrition: no continuous IV fluids, toelrating diet  Central lines / invasive devices: none  Code Status: FULL CODE  Current Admission Status: inpatient  TOC needs / Dispo plan: SNF vs HH  Barriers to discharge / significant pending items: pain control, culture results, possible placement              Subjective / Brief ROS:  Patient reports severe pain in L knee persists, upset at PT for making her hurt worse  Denies CP/SOB.   Denies new weakness.  Tolerating diet.  Reports no concerns w/ urination/defecation.   Family Communication: none at this time     Objective Findings:  Vitals:   09/03/22 2050 09/03/22 2330 09/04/22 0447 09/04/22 0800  BP: 124/60 132/73 110/65 113/71  Pulse: 84 80 83 80  Resp: 18 18 18 17  $ Temp: 98.3 F (36.8 C) 99.2 F (37.3 C) 99.3 F (37.4 C) 98.1 F (36.7 C)  TempSrc:      SpO2: 96% 97% 99% 100%  Weight:      Height:        Intake/Output Summary (Last 24 hours) at 09/04/2022 1155 Last data filed at 09/04/2022 0700 Gross per 24 hour  Intake 670 ml  Output 675 ml  Net -5 ml   Filed Weights   09/03/22  1143 09/03/22 1557  Weight: 77.6 kg 77.6 kg    Examination:  Physical Exam Constitutional:      General: She is not in acute distress.    Appearance: Normal appearance. She is obese. She is not ill-appearing.  Cardiovascular:     Rate and Rhythm: Normal rate and regular rhythm.     Heart sounds: Murmur heard.  Pulmonary:     Effort: Pulmonary effort is normal. No respiratory distress.     Breath sounds: Normal breath sounds.  Abdominal:     General: Abdomen is flat. Bowel sounds are normal.      Palpations: Abdomen is soft.  Musculoskeletal:     Comments: LLE was in cold compressive device and is bandaged - these were not disturbed, no edema or erythema extending proximally   Skin:    General: Skin is warm and dry.  Neurological:     General: No focal deficit present.     Mental Status: She is alert and oriented to person, place, and time. Mental status is at baseline.  Psychiatric:        Behavior: Behavior normal.        Thought Content: Thought content normal.     Comments: Angry but not agitated          Scheduled Medications:   buPROPion  75 mg Oral Daily   carvedilol  3.125 mg Oral BID WC   docusate sodium  100 mg Oral BID   heparin  5,000 Units Subcutaneous Q8H   levothyroxine  100 mcg Oral Q0600   montelukast  10 mg Oral QHS   spironolactone  25 mg Oral Daily    Continuous Infusions:  ceFEPime (MAXIPIME) IV 2 g (09/04/22 0526)   vancomycin      PRN Medications:  acetaminophen **OR** acetaminophen, hydrALAZINE, ipratropium-albuterol, metoCLOPramide **OR** metoCLOPramide (REGLAN) injection, morphine injection, ondansetron **OR** ondansetron (ZOFRAN) IV, ondansetron **OR** ondansetron (ZOFRAN) IV, oxyCODONE, senna-docusate  Antimicrobials from admission:  Anti-infectives (From admission, onward)    Start     Dose/Rate Route Frequency Ordered Stop   09/04/22 1700  vancomycin (VANCOREADY) IVPB 1250 mg/250 mL        1,250 mg 166.7 mL/hr over 90 Minutes Intravenous Every 24 hours 09/03/22 1536     09/04/22 0600  ceFEPIme (MAXIPIME) 2 g in sodium chloride 0.9 % 100 mL IVPB        2 g 200 mL/hr over 30 Minutes Intravenous Every 12 hours 09/03/22 1537     09/03/22 1600  vancomycin (VANCOREADY) IVPB 1750 mg/350 mL  Status:  Discontinued        1,750 mg 175 mL/hr over 120 Minutes Intravenous  Once 09/03/22 1524 09/03/22 1852   09/03/22 1500  ceFEPIme (MAXIPIME) 2 g in sodium chloride 0.9 % 100 mL IVPB        2 g 200 mL/hr over 30 Minutes Intravenous  Once  09/03/22 1453 09/03/22 1651           Data Reviewed:  I have personally reviewed the following...  CBC: Recent Labs  Lab 09/03/22 0947 09/03/22 1147 09/04/22 0604  WBC 10.7* 10.0 7.6  NEUTROABS 6.4 7.0  --   HGB 9.2* 9.5* 8.4*  HCT 28.4* 30.2* 26.5*  MCV 93.7 94.7 95.3  PLT 165 170 [id number]*   Basic Metabolic Panel: Recent Labs  Lab 09/03/22 0947 09/03/22 1147 09/03/22 1910 09/04/22 0604  NA 131* 130*  --  135  K 3.2* 3.5  --  3.6  CL 97* 96*  --  105  CO2 20* 22  --  21*  GLUCOSE 108* 111* 97 105*  BUN 9 9  --  8  CREATININE 0.83 0.90  --  0.84  CALCIUM 8.1* 8.3*  --  7.7*   GFR: Estimated Creatinine Clearance: 60.1 mL/min (by C-G formula based on SCr of 0.84 mg/dL). Liver Function Tests: Recent Labs  Lab 09/03/22 0947  AST 21  ALT 20  ALKPHOS 92  BILITOT 0.7  PROT 8.8*  ALBUMIN 3.5   No results for input(s): "LIPASE", "AMYLASE" in the last 168 hours. No results for input(s): "AMMONIA" in the last 168 hours. Coagulation Profile: No results for input(s): "INR", "PROTIME" in the last 168 hours. Cardiac Enzymes: No results for input(s): "CKTOTAL", "CKMB", "CKMBINDEX", "TROPONINI" in the last 168 hours. BNP (last 3 results) No results for input(s): "PROBNP" in the last 8760 hours. HbA1C: No results for input(s): "HGBA1C" in the last 72 hours. CBG: No results for input(s): "GLUCAP" in the last 168 hours. Lipid Profile: No results for input(s): "CHOL", "HDL", "LDLCALC", "TRIG", "CHOLHDL", "LDLDIRECT" in the last 72 hours. Thyroid Function Tests: No results for input(s): "TSH", "T4TOTAL", "FREET4", "T3FREE", "THYROIDAB" in the last 72 hours. Anemia Panel: No results for input(s): "VITAMINB12", "FOLATE", "FERRITIN", "TIBC", "IRON", "RETICCTPCT" in the last 72 hours. Most Recent Urinalysis On File:     Component Value Date/Time   COLORURINE STRAW (A) 05/25/2021 0049   APPEARANCEUR CLEAR (A) 05/25/2021 0049   LABSPEC 1.008 05/25/2021 0049   PHURINE 5.0  05/25/2021 0049   GLUCOSEU NEGATIVE 05/25/2021 0049   HGBUR MODERATE (A) 05/25/2021 0049   BILIRUBINUR NEGATIVE 05/25/2021 [place] 05/25/2021 0049   PROTEINUR 30 (A) 05/25/2021 0049   NITRITE NEGATIVE 05/25/2021 0049   LEUKOCYTESUR NEGATIVE 05/25/2021 0049   Sepsis Labs: @LABRCNTIP$ (procalcitonin:4,lacticidven:4) Microbiology: Recent Results (from the past 240 hour(s))  Body fluid culture w Gram Stain     Status: None (Preliminary result)   Collection Time: 09/03/22  1:05 PM   Specimen: KNEE; Body Fluid  Result Value Ref Range Status   Specimen Description   Final    KNEE Performed at [facility], [address] [place], [place] [address]    Special Requests   Final    Immunocompromised Performed at [facility], [address] [place], [place] [address]    Gram Stain   Final    RARE WBC PRESENT, PREDOMINANTLY PMN NO ORGANISMS SEEN    Culture   Final    NO GROWTH < 24 HOURS Performed at [facility], [place] [address] [place],  [address]    Report Status PENDING  Incomplete      Radiology Studies last 3 days: US Venous Img Lower Unilateral Left (DVT)  Result Date: 09/03/2022 CLINICAL DATA:  Pain and swelling EXAM: Left LOWER EXTREMITY VENOUS DOPPLER ULTRASOUND TECHNIQUE: Gray-scale sonography with compression, as well as color and duplex ultrasound, were performed to evaluate the deep venous system(s) from the level of the common femoral vein through the popliteal and proximal calf veins. COMPARISON:  None Available. FINDINGS: VENOUS Normal compressibility of the common femoral, superficial femoral, and popliteal veins, as well as the visualized calf veins. Visualized portions of profunda femoral vein and great saphenous vein unremarkable. No filling defects to suggest DVT on grayscale or color Doppler imaging. Doppler waveforms show normal direction of venous flow, normal respiratory plasticity and response to augmentation.  Limited views of the contralateral common femoral vein are unremarkable. OTHER None. Limitations: none  IMPRESSION: Negative. Electronically Signed   By: Donavan Foil M.D.   On: 09/03/2022 22:39   DG Knee Complete 4 Views Left  Result Date: 09/03/2022 CLINICAL DATA:  Left knee pain. Evaluate for fracture and infection. EXAM: LEFT KNEE - COMPLETE 4+ VIEW COMPARISON:  Bone survey 04/08/2017 FINDINGS: Left knee is located with a moderate sized suprapatellar joint effusion. Mild medial joint space narrowing. Osteophytosis in the medial knee compartment and patellofemoral compartment. Old fracture involving the proximal fibula. Atherosclerotic calcifications in the left lower extremity. IMPRESSION: 1. No acute bone abnormality in the left knee. 2. Moderate-sized joint effusion. 3. 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 4. Old fracture involving the proximal fibula. Electronically Signed   By: Markus Daft M.D.   On: 09/03/2022 12:50             LOS: 1 day       Emeterio Reeve, DO [facility] 09/04/2022, 11:55 AM    Dictation software may have been used to generate the above note. Typos may occur and escape review in typed/dictated notes. Please contact Dr Sheppard Coil directly for clarity if needed.  Staff may message me via secure chat in [place]  but this may not receive an immediate response,  please page me for urgent matters!  If 7PM-7AM, please contact night coverage [email or web address]

## 2022-09-04 NOTE — Plan of Care (Signed)

## 2022-09-04 NOTE — Consult Note (Signed)
Pharmacy Antibiotic Note  Brandi [name] is a 72 y.o. female with PMH including multiple myeloma on chemotherapy admitted on 09/03/2022 with  septic arthritis of left knee .  Pharmacy has been consulted for vancomycin and cefepime dosing.  Plan: Based on CrCl adjusted cefepime dose to 2 g q8H.   Vancomycin 1.75 g IV LD followed by maintenance regimen of vancomycin 1.25 g IV q24h --Calculated AUC: 493, Cmin: 12.2 --Vd 0.72 (BMI 31 - borderline), Scr 0.84 --Daily Scr per protocol --Levels at steady state or as clinically indicated  Height: 5' 2"$  (157.5 cm) Weight: 77.6 kg (171 lb 1.2 oz) IBW/kg (Calculated) : 50.1  Temp (24hrs), Avg:98.8 F (37.1 C), Min:98.1 F (36.7 C), Max:99.3 F (37.4 C)  Recent Labs  Lab 09/03/22 0947 09/03/22 1147 09/03/22 1910 09/04/22 0604  WBC 10.7* 10.0  --  7.6  CREATININE 0.83 0.90  --  0.84  LATICACIDVEN  --  1.6 1.3  --      Estimated Creatinine Clearance: 60.1 mL/min (by C-G formula based on SCr of 0.84 mg/dL).    No Known Allergies  Antimicrobials this admission: Cefepime 2/9 >>  Vancomycin 2/9 >>   Dose adjustments this admission: N/A  Microbiology results: Lt knee synovial fluid: pending  Thank you for allowing pharmacy to be a part of this patient's care.  Oswald Hillock 09/04/2022 12:42 PM

## 2022-09-04 NOTE — Plan of Care (Signed)
  Problem: Clinical Measurements: Goal: Will remain free from infection Outcome: Progressing   Problem: Activity: Goal: Risk for activity intolerance will decrease Outcome: Progressing   Problem: Pain Managment: Goal: General experience of comfort will improve Outcome: Progressing   Problem: Safety: Goal: Ability to remain free from injury will improve Outcome: Progressing   Problem: Skin Integrity: Goal: Risk for impaired skin integrity will decrease Outcome: Progressing   

## 2022-09-04 NOTE — Progress Notes (Signed)
Initial Nutrition Assessment  DOCUMENTATION CODES:   Not applicable  INTERVENTION:  Will liberalize diet to 2 gram sodium to allow more options on menu. If appetite/intake is decreased, consider liberalizing further to regular diet.  Provide Ensure Enlive po TID, each supplement provides 350 kcal and 20 grams of protein. Pt prefers vanilla.  Provide multivitamin with minerals po daily.  NUTRITION DIAGNOSIS:   Increased nutrient needs related to catabolic illness (multiple myeloma, COPD, HF) as evidenced by estimated needs.  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  Body fluid from left knee aspirate culture pending.  RD working remotely. Spoke with patient over the phone. She reports her appetite is good and unchanged from baseline. She reports eating 2 good meals per day. She may have chicken, beef, or fish with vegetables. She occasionally has a snack of chips or cookies but not daily. She also drinks 2-3 bottles of Ensure Plus vanilla daily. Pt denies any food allergies or intolerances. Denies nausea, emesis, abdominal pain, or difficulty chewing/swallowing. Pt would like to continue drinking Ensure 2-3 times daily while admitted. She would benefit from liberalization of diet to 2 gram sodium to allow more options on the menu. If appetite/intake are decreased, consider liberalizing further to regular diet.  Pt reports her  UBW recently has been 179 lbs. Per review of chart pt was 81.8 kg on 06/25/22. Current weight is 77.6 kg (171.08 lbs). She has lost 4.2 kg or 5.1% weight over almost 2.5 months, which is not significant for time frame. Pt reports her weight is unchanged from baseline, so recommend continuing to monitor. Current wt may be impacted by edema as pt documented to have non-pitting edema.  Medications reviewed and include: Colace, levothyroxine, spironolactone, cefepime, vancomycin  Labs reviewed: CO2 21 Vitamin D 44.02 WNL 02/11/22  UOP: 650 mL + 2 occurrences unmeasured UOP  I/O: -5 mL since admission  Unable to determine if pt meets criteria for malnutrition at this time. Recommend obtaining nutrition-focused physical exam at follow-up to better assess nutritional status.  NUTRITION - FOCUSED PHYSICAL EXAM:  Unable to complete at this time as RD is working remotely.  Diet Order:   Diet Order             Diet Heart Room service appropriate? Yes; Fluid consistency: Thin  Diet effective now                  EDUCATION NEEDS:   No education needs have been identified at this time  Skin:  Skin Assessment: Skin Integrity Issues: Skin Integrity Issues:: Incisions Incisions: closed incision left leg  Last BM:  Unknown  Height:   Ht Readings from Last 1 Encounters:  09/03/22 5' 2"$  (1.575 m)   Weight:   Wt Readings from Last 1 Encounters:  09/03/22 77.6 kg   Ideal Body Weight:  50 kg  BMI:  Body mass index is 31.29 kg/m.  Estimated Nutritional Needs:   Kcal:  1900-2100  Protein:  95-105 grams  Fluid:  1.9-2.1 L/day  Loanne Drilling, MS, RD, LDN, CNSC Pager number available on Amion

## 2022-09-04 NOTE — Evaluation (Signed)
Physical Therapy Evaluation Patient Details Name: Brandi [name] MRN: [id number] DOB: April 09, 1951 Today's Date: 09/04/2022  History of Present Illness  Pt is a 72 year old female with PMH that includes COPD, CHF, HTN, and multiple myeloma who presents from oncology clinic for chief concerns of 3 days of worsening left knee pain. MD assessment includes hyponatremia and septic left knee joint with pt now s/p left arthroscopic knee debridement.   Clinical Impression  Pt was pleasant and motivated to participate during the session and put forth good effort throughout. Pt required min A to manage her LLE during sit to sup and required significant effort and min A to come to standing.  Pt was able to take a max of 2-3 very small, shuffling steps at the EOB with very little ability to WB through her LLE.  Only with heavy cuing for step-to pattern was she able to shuffle with max UE support through the walker while sliding her R foot.  Pt will benefit from PT services in a SNF setting upon discharge to safely address deficits listed in patient problem list for decreased caregiver assistance and eventual return to PLOF.         Recommendations for follow up therapy are one component of a multi-disciplinary discharge planning process, led by the attending physician.  Recommendations may be updated based on patient status, additional functional criteria and insurance authorization.  Follow Up Recommendations Skilled nursing-short term rehab (<3 hours/day) Can patient physically be transported by private vehicle: No    Assistance Recommended at Discharge Frequent or constant Supervision/Assistance  Patient can return home with the following  A lot of help with walking and/or transfers;A little help with bathing/dressing/bathroom    Equipment Recommendations Other (comment) (TBD at next venue of care)  Recommendations for Other Services       Functional Status Assessment Patient has had a recent  decline in their functional status and demonstrates the ability to make significant improvements in function in a reasonable and predictable amount of time.     Precautions / Restrictions Precautions Precautions: Fall Restrictions Weight Bearing Restrictions: Yes LLE Weight Bearing: Weight bearing as tolerated      Mobility  Bed Mobility Overal bed mobility: Needs Assistance Bed Mobility: Supine to Sit, Sit to Supine     Supine to sit: Supervision Sit to supine: Min assist   General bed mobility comments: Extra time and effort during sup to sit with pt using BUEs to assist LLE out of bed, required min A to manage LLE back into bed    Transfers Overall transfer level: Needs assistance Equipment used: Rolling walker (2 wheels) Transfers: Sit to/from Stand Sit to Stand: Min assist, From elevated surface           General transfer comment: Mod verbal cues for sequencing and min A to come to standing from an elevated EOB    Ambulation/Gait Ambulation/Gait assistance: Min assist Gait Distance (Feet): 1 Feet Assistive device: Rolling walker (2 wheels) Gait Pattern/deviations: Step-to pattern, Decreased stance time - left, Decreased step length - right, Shuffle Gait velocity: decreased     General Gait Details: Pt only able to take 2-3 very small, shuffling steps at the EOB with min A to guide the RW and max verbal cues for sequencing and safe use of the RW  Stairs            Wheelchair Mobility    Modified Rankin (Stroke Patients Only)       Balance Overall balance assessment:  Needs assistance   Sitting balance-Leahy Scale: Good     Standing balance support: Bilateral upper extremity supported, During functional activity Standing balance-Leahy Scale: Fair                               Pertinent Vitals/Pain Pain Assessment Pain Assessment: 0-10 Pain Score: 8  Pain Location: L knee Pain Descriptors / Indicators: Aching, Sore Pain  Intervention(s): Repositioned, Premedicated before session, Monitored during session, Ice applied    Home Living Family/patient expects to be discharged to:: Private residence Living Arrangements: Children Available Help at Discharge: Family;Available 24 hours/day Type of Home: House Home Access: Stairs to enter Entrance Stairs-Rails: Right;Left (too wide for both) Entrance Stairs-Number of Steps: 3   Home Layout: One level Home Equipment: Grab bars - tub/shower;Grab bars - toilet;Shower seat Additional Comments: Pt lives with son    Prior Function Prior Level of Function : Independent/Modified Independent             Mobility Comments: Ind amb community distances without an AD, no fall history ADLs Comments: Ind with ADLs     Hand Dominance        Extremity/Trunk Assessment   Upper Extremity Assessment Upper Extremity Assessment: Overall WFL for tasks assessed    Lower Extremity Assessment Lower Extremity Assessment: Generalized weakness;LLE deficits/detail LLE: Unable to fully assess due to pain       Communication   Communication: No difficulties  Cognition Arousal/Alertness: Awake/alert Behavior During Therapy: WFL for tasks assessed/performed Overall Cognitive Status: Within Functional Limits for tasks assessed                                          General Comments      Exercises Total Joint Exercises Ankle Circles/Pumps: AROM, Strengthening, Both, 10 reps Quad Sets: Strengthening, Both, 10 reps Gluteal Sets: Strengthening, Both, 10 reps Hip ABduction/ADduction: AAROM, Strengthening, Left, 5 reps Straight Leg Raises: AAROM, Strengthening, Left, 5 reps Long Arc Quad: AROM, Strengthening, Both, 5 reps, 10 reps (very small amplitude on the LLE) Knee Flexion: AROM, Strengthening, Both, 5 reps, 10 reps (very small amplitude on the LLE)   Assessment/Plan    PT Assessment Patient needs continued PT services  PT Problem List Decreased  strength;Decreased range of motion;Decreased activity tolerance;Decreased balance;Decreased mobility;Decreased knowledge of use of DME       PT Treatment Interventions DME instruction;Gait training;Stair training;Functional mobility training;Therapeutic activities;Therapeutic exercise;Balance training;Patient/family education    PT Goals (Current goals can be found in the Care Plan section)  Acute Rehab PT Goals Patient Stated Goal: To walk and get around better PT Goal Formulation: With patient Time For Goal Achievement: 09/17/22 Potential to Achieve Goals: Good    Frequency Min 2X/week     Co-evaluation               AM-PAC PT "6 Clicks" Mobility  Outcome Measure Help needed turning from your back to your side while in a flat bed without using bedrails?: A Little Help needed moving from lying on your back to sitting on the side of a flat bed without using bedrails?: A Little Help needed moving to and from a bed to a chair (including a wheelchair)?: A Lot Help needed standing up from a chair using your arms (e.g., wheelchair or bedside chair)?: A Lot Help needed to walk in  hospital room?: Total Help needed climbing 3-5 steps with a railing? : Total 6 Click Score: 12    End of Session Equipment Utilized During Treatment: Gait belt Activity Tolerance: Patient limited by pain Patient left: in bed;with call bell/phone within reach;with bed alarm set;with SCD's reapplied;Other (comment) (SCD to RLE, polar care to LLE) Nurse Communication: Mobility status PT Visit Diagnosis: Other abnormalities of gait and mobility (R26.89);Muscle weakness (generalized) (M62.81);Pain Pain - Right/Left: Left Pain - part of body: Knee    Time: [id number] PT Time Calculation (min) (ACUTE ONLY): 36 min   Charges:   PT Evaluation $PT Eval Moderate Complexity: 1 Mod PT Treatments $Therapeutic Exercise: 8-22 mins      D. Scott Wynne Rozak PT, DPT 09/04/22, 11:34 AM

## 2022-09-05 DIAGNOSIS — K219 Gastro-esophageal reflux disease without esophagitis: Secondary | ICD-10-CM | POA: Diagnosis not present

## 2022-09-05 DIAGNOSIS — M009 Pyogenic arthritis, unspecified: Secondary | ICD-10-CM | POA: Diagnosis not present

## 2022-09-05 DIAGNOSIS — E038 Other specified hypothyroidism: Secondary | ICD-10-CM | POA: Diagnosis not present

## 2022-09-05 DIAGNOSIS — J449 Chronic obstructive pulmonary disease, unspecified: Secondary | ICD-10-CM | POA: Diagnosis not present

## 2022-09-05 LAB — CBC
HCT: 27 % — ABNORMAL LOW (ref 36.0–46.0)
Hemoglobin: 8.5 g/dL — ABNORMAL LOW (ref 12.0–15.0)
MCH: 29.5 pg (ref 26.0–34.0)
MCHC: 31.5 g/dL (ref 30.0–36.0)
MCV: 93.8 fL (ref 80.0–100.0)
Platelets: 124 10*3/uL — ABNORMAL LOW (ref 150–400)
RBC: 2.88 MIL/uL — ABNORMAL LOW (ref 3.87–5.11)
RDW: 15.6 % — ABNORMAL HIGH (ref 11.5–15.5)
WBC: 5.6 10*3/uL (ref 4.0–10.5)
nRBC: 0 % (ref 0.0–0.2)

## 2022-09-05 LAB — BASIC METABOLIC PANEL
Anion gap: 7 (ref 5–15)
BUN: 10 mg/dL (ref 8–23)
CO2: 22 mmol/L (ref 22–32)
Calcium: 8.2 mg/dL — ABNORMAL LOW (ref 8.9–10.3)
Chloride: 106 mmol/L (ref 98–111)
Creatinine, Ser: 0.93 mg/dL (ref 0.44–1.00)
GFR, Estimated: >60 mL/min (ref >60)
Glucose, Bld: 105 mg/dL — ABNORMAL HIGH (ref 70–99)
Potassium: 3 mmol/L — ABNORMAL LOW (ref 3.5–5.1)
Sodium: 135 mmol/L (ref 135–145)

## 2022-09-05 MED ORDER — POLYETHYLENE GLYCOL 3350 17 G PO PACK
17.0000 g | PACK | Freq: Every day | ORAL | Status: DC | PRN
  Administered 2022-09-05 – 2022-09-06 (×2): 17 g via ORAL
  Filled 2022-09-05 (×2): qty 1

## 2022-09-05 MED ORDER — OXYCODONE HCL 5 MG PO TABS
10.0000 mg | ORAL_TABLET | Freq: Four times a day (QID) | ORAL | Status: AC | PRN
  Administered 2022-09-05 – 2022-09-07 (×7): 10 mg via ORAL
  Filled 2022-09-05 (×7): qty 2

## 2022-09-05 MED ORDER — SODIUM CHLORIDE 0.9 % IV SOLN
2.0000 g | Freq: Two times a day (BID) | INTRAVENOUS | Status: AC
  Administered 2022-09-05 – 2022-09-06 (×3): 2 g via INTRAVENOUS
  Filled 2022-09-05 (×3): qty 12.5

## 2022-09-05 MED ORDER — POTASSIUM CHLORIDE CRYS ER 20 MEQ PO TBCR
40.0000 meq | EXTENDED_RELEASE_TABLET | Freq: Two times a day (BID) | ORAL | Status: AC
  Administered 2022-09-05 (×2): 40 meq via ORAL
  Filled 2022-09-05 (×2): qty 2

## 2022-09-05 MED ORDER — MORPHINE SULFATE (PF) 2 MG/ML IV SOLN
2.0000 mg | INTRAVENOUS | Status: AC | PRN
  Administered 2022-09-05 – 2022-09-07 (×4): 2 mg via INTRAVENOUS
  Filled 2022-09-05 (×4): qty 1

## 2022-09-05 NOTE — Progress Notes (Signed)
PROGRESS NOTE    Brandi [name]   [id number] DOB: 1951-03-01  DOA: 09/03/2022 Date of Service: 09/06/22 PCP: Marguerita Merles, MD     Brief Narrative / Hospital Course:  Brandi [name] is a 71 year old female with history of multiple myeloma, who presents from oncology clinic for chief concerns of 3 days of worsening left knee pain. 02/09: to ED, admitted to hospitalist service, to OR for arthroscopic joint washout w/ Dr. Harlow Mares, continued on vancomycin and cefepime pending joint fluid cultures. DVT ruled out.  02/10: PT recs for SNF but pt states she is not interested in this at this time. Awaiting Cx from joint aspirate. Pain control.  02/11: Cx (+)strep pneumo, reincubated. Ortho recs for ID consult   Consultants:  Orthopedics  Procedures: 09/03/2022 joint aspiration in ED  09/03/2022 arthroscopic L knee debridement       ASSESSMENT & PLAN:   Principal Problem:   Septic arthritis of knee, left ([place]) Active Problems:   Multiple myeloma not having achieved remission ([place])   Essential hypertension   S/P autologous bone marrow transplantation ([place])   Chronic heart failure with preserved ejection fraction (HFpEF) (HCC)   Hyponatremia   Hypothyroidism   COPD (chronic obstructive pulmonary disease) (HCC)   GERD (gastroesophageal reflux disease)   Leg swelling  Septic arthritis of knee, left (HCC) Leg swelling DVT r/o Body fluid from left knee aspirate culture collected 02/09 no growth so far, deep wound culture also collected from surgery later on 02/09 is pending.   Continue vancomycin and cefepime - today 09/05/22 is Day 3 Pain control: Acetaminophen 650 mg p.o. every 6 hours as needed for mild pain, fever, headache; oxycodone started yesterday 10 mg q4h prn mod-severe pain and morphine for breakthrough pain, increased interval today on po meds to q6h prn and plan to continue to taper dose/frequency  Ortho following, recs: up w/ therapy, no restrictions, recommend  ID consult, dressing change tomorrow  Hypokalemia Replaced Monitor BMP  Hypothyroidism Levothyroxine 100 mcg daily resumed before breakfast  Essential hypertension Coreg 3.125 mg p.o. twice daily resumed Hydralazine 5 mg every 8 hours as needed for SBP greater than 180, 4 days ordered  Chronic heart failure with preserved ejection fraction (HFpEF) (HCC) Patient not in acute exacerbation Strict I's and O's  COPD (chronic obstructive pulmonary disease) ([place]) Patient is not in acute exacerbation DuoNebs every 6 hours as needed for shortness of breath and wheezing  Hyponatremia - resolved w /IV fluids  BMP in a.m.    DVT prophylaxis: heparin Pertinent IV fluids/nutrition: no continuous IV fluids, toelrating diet  Central lines / invasive devices: none  Code Status: FULL CODE  Current Admission Status: inpatient  TOC needs / Dispo plan: SNF vs HH  Barriers to discharge / significant pending items: pain control, culture results, possible placement              Subjective / Brief ROS:  Patient reports severe pain in L knee persists, upset at PT for making her hurt worse  Denies CP/SOB.   Denies new weakness.  Tolerating diet.  Reports no concerns w/ urination/defecation.   Family Communication: none at this time     Objective Findings:  Vitals:   09/05/22 0817 09/05/22 1652 09/06/22 0041 09/06/22 0735  BP: 102/62 110/62 117/63 100/64  Pulse: 74 78 72 70  Resp: 18 18 17 15  $ Temp: 99.4 F (37.4 C) 99 F (37.2 C) 98.8 F (37.1 C) 98.7 F (37.1 C)  TempSrc:  Oral Oral  SpO2: 97% 98% 97% 99%  Weight:      Height:        Intake/Output Summary (Last 24 hours) at 09/06/2022 0844 Last data filed at 09/06/2022 0300 Gross per 24 hour  Intake 1020 ml  Output 900 ml  Net 120 ml   Filed Weights   09/03/22 1143 09/03/22 1557  Weight: 77.6 kg 77.6 kg    Examination:  Physical Exam Constitutional:      General: She is not in acute distress.     Appearance: Normal appearance. She is obese. She is not ill-appearing.  Cardiovascular:     Rate and Rhythm: Normal rate and regular rhythm.     Heart sounds: Murmur heard.  Pulmonary:     Effort: Pulmonary effort is normal. No respiratory distress.     Breath sounds: Normal breath sounds.  Abdominal:     General: Abdomen is flat. Bowel sounds are normal.     Palpations: Abdomen is soft.  Musculoskeletal:     Comments: LLE was in cold compressive device and is bandaged - these were not disturbed, no edema or erythema extending proximally   Skin:    General: Skin is warm and dry.  Neurological:     General: No focal deficit present.     Mental Status: She is alert and oriented to person, place, and time. Mental status is at baseline.  Psychiatric:        Behavior: Behavior normal.        Thought Content: Thought content normal.     Comments: Angry but not agitated          Scheduled Medications:   buPROPion  75 mg Oral Daily   carvedilol  3.125 mg Oral BID WC   Chlorhexidine Gluconate Cloth  6 each Topical Daily   docusate sodium  100 mg Oral BID   feeding supplement  237 mL Oral TID BM   heparin  5,000 Units Subcutaneous Q8H   levothyroxine  100 mcg Oral Q0600   montelukast  10 mg Oral QHS   multivitamin with minerals  1 tablet Oral Daily   spironolactone  25 mg Oral Daily    Continuous Infusions:  ceFEPime (MAXIPIME) IV 2 g (09/06/22 0505)   vancomycin Stopped (09/05/22 1843)    PRN Medications:  acetaminophen **OR** acetaminophen, hydrALAZINE, ipratropium-albuterol, metoCLOPramide **OR** metoCLOPramide (REGLAN) injection, morphine injection, ondansetron **OR** ondansetron (ZOFRAN) IV, ondansetron **OR** ondansetron (ZOFRAN) IV, oxyCODONE, polyethylene glycol, senna-docusate  Antimicrobials from admission:  Anti-infectives (From admission, onward)    Start     Dose/Rate Route Frequency Ordered Stop   09/05/22 1752  ceFEPIme (MAXIPIME) 2 g in sodium chloride 0.9  % 100 mL IVPB        2 g 200 mL/hr over 30 Minutes Intravenous Every 12 hours 09/05/22 0737     09/04/22 1700  vancomycin (VANCOREADY) IVPB 1250 mg/250 mL        1,250 mg 166.7 mL/hr over 90 Minutes Intravenous Every 24 hours 09/03/22 1536     09/04/22 1400  ceFEPIme (MAXIPIME) 2 g in sodium chloride 0.9 % 100 mL IVPB  Status:  Discontinued        2 g 200 mL/hr over 30 Minutes Intravenous Every 8 hours 09/04/22 1242 09/05/22 0738   09/04/22 0600  ceFEPIme (MAXIPIME) 2 g in sodium chloride 0.9 % 100 mL IVPB  Status:  Discontinued        2 g 200 mL/hr over 30 Minutes Intravenous Every 12  hours 09/03/22 1537 09/04/22 1242   09/03/22 1600  vancomycin (VANCOREADY) IVPB 1750 mg/350 mL  Status:  Discontinued        1,750 mg 175 mL/hr over 120 Minutes Intravenous  Once 09/03/22 1524 09/03/22 1852   09/03/22 1500  ceFEPIme (MAXIPIME) 2 g in sodium chloride 0.9 % 100 mL IVPB        2 g 200 mL/hr over 30 Minutes Intravenous  Once 09/03/22 1453 09/03/22 1651           Data Reviewed:  I have personally reviewed the following...  CBC: Recent Labs  Lab 09/03/22 0947 09/03/22 1147 09/04/22 0604 09/05/22 0341  WBC 10.7* 10.0 7.6 5.6  NEUTROABS 6.4 7.0  --   --   HGB 9.2* 9.5* 8.4* 8.5*  HCT 28.4* 30.2* 26.5* 27.0*  MCV 93.7 94.7 95.3 93.8  PLT 165 170 142* [id number]*   Basic Metabolic Panel: Recent Labs  Lab 09/03/22 0947 09/03/22 1147 09/03/22 1910 09/04/22 0604 09/05/22 0341 09/06/22 0504  NA 131* 130*  --  135 135 135  K 3.2* 3.5  --  3.6 3.0* 3.9  CL 97* 96*  --  105 106 106  CO2 20* 22  --  21* 22 20*  GLUCOSE 108* 111* 97 105* 105* 99  BUN 9 9  --  8 10 9  $ CREATININE 0.83 0.90  --  0.84 0.93 0.76  CALCIUM 8.1* 8.3*  --  7.7* 8.2* 8.2*   GFR: Estimated Creatinine Clearance: 63.1 mL/min (by C-G formula based on SCr of 0.76 mg/dL). Liver Function Tests: Recent Labs  Lab 09/03/22 0947  AST 21  ALT 20  ALKPHOS 92  BILITOT 0.7  PROT 8.8*  ALBUMIN 3.5   No results  for input(s): "LIPASE", "AMYLASE" in the last 168 hours. No results for input(s): "AMMONIA" in the last 168 hours. Coagulation Profile: No results for input(s): "INR", "PROTIME" in the last 168 hours. Cardiac Enzymes: No results for input(s): "CKTOTAL", "CKMB", "CKMBINDEX", "TROPONINI" in the last 168 hours. BNP (last 3 results) No results for input(s): "PROBNP" in the last 8760 hours. HbA1C: No results for input(s): "HGBA1C" in the last 72 hours. CBG: No results for input(s): "GLUCAP" in the last 168 hours. Lipid Profile: No results for input(s): "CHOL", "HDL", "LDLCALC", "TRIG", "CHOLHDL", "LDLDIRECT" in the last 72 hours. Thyroid Function Tests: No results for input(s): "TSH", "T4TOTAL", "FREET4", "T3FREE", "THYROIDAB" in the last 72 hours. Anemia Panel: No results for input(s): "VITAMINB12", "FOLATE", "FERRITIN", "TIBC", "IRON", "RETICCTPCT" in the last 72 hours. Most Recent Urinalysis On File:     Component Value Date/Time   COLORURINE STRAW (A) 05/25/2021 0049   APPEARANCEUR CLEAR (A) 05/25/2021 0049   LABSPEC 1.008 05/25/2021 0049   PHURINE 5.0 05/25/2021 0049   GLUCOSEU NEGATIVE 05/25/2021 0049   HGBUR MODERATE (A) 05/25/2021 0049   BILIRUBINUR NEGATIVE 05/25/2021 [place] 05/25/2021 0049   PROTEINUR 30 (A) 05/25/2021 0049   NITRITE NEGATIVE 05/25/2021 0049   LEUKOCYTESUR NEGATIVE 05/25/2021 0049   Sepsis Labs: @LABRCNTIP$ (procalcitonin:4,lacticidven:4) Microbiology: Recent Results (from the past 240 hour(s))  Body fluid culture w Gram Stain     Status: None (Preliminary result)   Collection Time: 09/03/22  1:05 PM   Specimen: KNEE; Body Fluid  Result Value Ref Range Status   Specimen Description   Final    KNEE Performed at [facility], [address], [place], [place] [address]    Special Requests   Final    Immunocompromised Performed  at [facility], [address] [place], [place] [address]    Gram Stain   Final     RARE WBC PRESENT, PREDOMINANTLY PMN NO ORGANISMS SEEN    Culture   Final    RARE STREPTOCOCCUS PNEUMONIAE CULTURE REINCUBATED FOR BETTER GROWTH CRITICAL RESULT CALLED TO, READ BACK BY AND VERIFIED WITH: RN SHON.C AT 1145 ON 09/05/2022 BY T.SAAD. Performed at [facility], [place] [address] [place], [place] [address]    Report Status PENDING  Incomplete      Radiology Studies last 3 days: US Venous Img Lower Unilateral Left (DVT)  Result Date: 09/03/2022 CLINICAL DATA:  Pain and swelling EXAM: Left LOWER EXTREMITY VENOUS DOPPLER ULTRASOUND TECHNIQUE: Gray-scale sonography with compression, as well as color and duplex ultrasound, were performed to evaluate the deep venous system(s) from the level of the common femoral vein through the popliteal and proximal calf veins. COMPARISON:  None Available. FINDINGS: VENOUS Normal compressibility of the common femoral, superficial femoral, and popliteal veins, as well as the visualized calf veins. Visualized portions of profunda femoral vein and great saphenous vein unremarkable. No filling defects to suggest DVT on grayscale or color Doppler imaging. Doppler waveforms show normal direction of venous flow, normal respiratory plasticity and response to augmentation. Limited views of the contralateral common femoral vein are unremarkable. OTHER None. Limitations: none IMPRESSION: Negative. Electronically Signed   By: Donavan Foil M.D.   On: 09/03/2022 22:39   DG Knee Complete 4 Views Left  Result Date: 09/03/2022 CLINICAL DATA:  Left knee pain. Evaluate for fracture and infection. EXAM: LEFT KNEE - COMPLETE 4+ VIEW COMPARISON:  Bone survey 04/08/2017 FINDINGS: Left knee is located with a moderate sized suprapatellar joint effusion. Mild medial joint space narrowing. Osteophytosis in the medial knee compartment and patellofemoral compartment. Old fracture involving the proximal fibula. Atherosclerotic calcifications in the left lower extremity. IMPRESSION:  1. No acute bone abnormality in the left knee. 2. Moderate-sized joint effusion. 3. 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 4. Old fracture involving the proximal fibula. Electronically Signed   By: Markus Daft M.D.   On: 09/03/2022 12:50             LOS: 3 days       Emeterio Reeve, DO [facility] 09/06/2022, 8:44 AM    Dictation software may have been used to generate the above note. Typos may occur and escape review in typed/dictated notes. Please contact Dr Sheppard Coil directly for clarity if needed.  Staff may message me via secure chat in [place]  but this may not receive an immediate response,  please page me for urgent matters!  If 7PM-7AM, please contact night coverage [email or web address]

## 2022-09-05 NOTE — Plan of Care (Signed)

## 2022-09-05 NOTE — TOC Initial Note (Signed)
Transition of Care [facility]) - Initial/Assessment Note    Patient Details  Name: Brandi [name] MRN: [id number] Date of Birth: 12-19-1950  Transition of Care [facility]) CM/SW Contact:    Valente David, RN Phone Number: 09/05/2022, 12:55 PM  Clinical Narrative:                  Patient lives at home, son stays with her.  She is a patient of Dr. Lennox Grumbles for primary care, obtains medications from [facility] on [address].  Denies having DME currently in the home.  Aware that there are recommendations for SNF for short term rehab, she does not agree, stating she want to go home.  Advised of benefits of rehab center, state she has a big support system (adult children, friends, neighbors) that will be available in the home.  Advised to discuss with family prior to making final decision.  State daughter will be here today, she will discuss at that time.  TOC will continue to be involved for discharge planning.   Expected Discharge Plan: Skilled Nursing Facility Barriers to Discharge: Continued Medical Work up   Patient Goals and CMS Choice Patient states their goals for this hospitalization and ongoing recovery are:: Want to go home instead of SNF CMS Medicare.gov Compare Post Acute Care list provided to:: Patient Choice offered to / list presented to : Patient      Expected Discharge Plan and Services       Living arrangements for the past 2 months: Single Family Home                                      Prior Living Arrangements/Services Living arrangements for the past 2 months: Single Family Home Lives with:: Adult Children   Do you feel safe going back to the place where you live?: Yes      Need for Family Participation in Patient Care: Yes (Comment) Care giver support system in place?: Yes (comment)   Criminal Activity/Legal Involvement Pertinent to Current Situation/Hospitalization: No - Comment as needed  Activities of Daily Living Home Assistive Devices/Equipment:  None ADL Screening (condition at time of admission) Patient's cognitive ability adequate to safely complete daily activities?: Yes Is the patient deaf or have difficulty hearing?: No Does the patient have difficulty seeing, even when wearing glasses/contacts?: No Does the patient have difficulty concentrating, remembering, or making decisions?: No Patient able to express need for assistance with ADLs?: Yes Does the patient have difficulty dressing or bathing?: Yes Independently performs ADLs?: Yes (appropriate for developmental age) Does the patient have difficulty walking or climbing stairs?: Yes Weakness of Legs: Left Weakness of Arms/Hands: None  Permission Sought/Granted   Permission granted to share information with : Yes, Verbal Permission Granted              Emotional Assessment   Attitude/Demeanor/Rapport: Engaged Affect (typically observed): Calm Orientation: : Oriented to Self, Oriented to Place, Oriented to  Time, Oriented to Situation   Psych Involvement: No (comment)  Admission diagnosis:  Septic arthritis of knee, left (HCC) [M00.9] Pyogenic arthritis of left knee joint, due to unspecified organism [facility]) [M00.9] Patient Active Problem List   Diagnosis Date Noted   Septic arthritis of knee, left ([place]) 09/03/2022   Leg swelling 09/03/2022   Abdominal pain 08/29/2021   Pneumonia 05/24/2021   Hypothyroidism 05/24/2021   Pleuritic chest pain    Acute on chronic respiratory failure  with hypoxia (HCC)    Hyponatremia    Leukopenia    COPD (chronic obstructive pulmonary disease) (HCC)    GERD (gastroesophageal reflux disease) 03/19/2021   Graves' disease 03/19/2021   Lobar pneumonia ([place]) 10/10/2020   Sepsis ([place]) 10/10/2020   Chronic heart failure with preserved ejection fraction (HFpEF) ([place]) 12/14/2019   S/P autologous bone marrow transplantation ([place]) 06/29/2018   Essential hypertension 05/29/2018   At risk for cardiac dysfunction 05/26/2018   Hypokalemia  05/01/2018   Encounter for monitoring cardiotoxic drug therapy 11/08/2017   Red blood cell antibody positive, compatible PRBC difficult to obtain 10/21/2017   Cough in adult patient 07/29/2017   Hip pain, acute, right 07/29/2017   Multiple myeloma not having achieved remission ([place]) 04/08/2017   ARF (acute renal failure) ([place]) 04/05/2017   PCP:  Marguerita Merles, MD Pharmacy:   [facility] [address]), [place]) [place] [address] Phone: [phone] Fax: [phone]  [facility],  - [address] [place] [address] Phone: [phone] Fax: [phone]     Social Determinants of Health (SDOH) Social History: [place]: No Food Insecurity (09/04/2022)  Housing: Low Risk  (09/04/2022)  Transportation Needs: No Transportation Needs (09/04/2022)  Utilities: Not At Risk (09/04/2022)  Tobacco Use: Medium Risk (09/04/2022)   SDOH Interventions:     Readmission Risk Interventions    05/27/2021   10:42 AM  Readmission Risk Prevention Plan  Transportation Screening Complete  Medication Review ([place]) Complete  PCP or Specialist appointment within 3-5 days of discharge Complete  HRI or [place] Complete  SW Recovery Care/Counseling Consult Complete  Palliative Care Screening Complete  [place] Not Applicable

## 2022-09-05 NOTE — Progress Notes (Signed)
   09/05/22 1000  Spiritual Encounters  Type of Visit Initial  Care provided to: Patient  Referral source Patient request  Reason for visit Advance directives  OnCall Visit Yes  Spiritual Framework  Presenting Themes Courage hope and growth;Coping tools  Patient Stress Factors Health changes   Chaplain responded to [facility] consult to provide AD information. Packet given, explained. Patient will advise nurse when completed to Chaplain services can be called. Patient also shared the emotional pain of walking through present situation. Chaplain intervened by providing a listening ear and suggesting some coping tools.

## 2022-09-05 NOTE — Progress Notes (Signed)
Subjective:  Patient reports pain as moderate.  No other complaints.  Objective:   VITALS:   Vitals:   09/04/22 1821 09/04/22 1947 09/04/22 2349 09/05/22 0817  BP: 102/62 (!) 108/55 104/61 102/62  Pulse: 72 80 77 74  Resp:   18 18  Temp:   98.2 F (36.8 C) 99.4 F (37.4 C)  TempSrc:      SpO2:   98% 97%  Weight:      Height:        PHYSICAL EXAM:  Sensation intact distally Dorsiflexion/Plantar flexion intact Incision: dressing C/D/I No cellulitis present Compartment soft  LABS  Results for orders placed or performed during the hospital encounter of 09/03/22 (from the past 24 hour(s))  CBC     Status: Abnormal   Collection Time: 09/05/22  3:41 AM  Result Value Ref Range   WBC 5.6 4.0 - 10.5 K/uL   RBC 2.88 (L) 3.87 - 5.11 MIL/uL   Hemoglobin 8.5 (L) 12.0 - 15.0 g/dL   HCT 27.0 (L) 36.0 - 46.0 %   MCV 93.8 80.0 - 100.0 fL   MCH 29.5 26.0 - 34.0 pg   MCHC 31.5 30.0 - 36.0 g/dL   RDW 15.6 (H) 11.5 - 15.5 %   Platelets 124 (L) 150 - 400 K/uL   nRBC 0.0 0.0 - 0.2 %  Basic metabolic panel     Status: Abnormal   Collection Time: 09/05/22  3:41 AM  Result Value Ref Range   Sodium 135 135 - 145 mmol/L   Potassium 3.0 (L) 3.5 - 5.1 mmol/L   Chloride 106 98 - 111 mmol/L   CO2 22 22 - 32 mmol/L   Glucose, Bld 105 (H) 70 - 99 mg/dL   BUN 10 8 - 23 mg/dL   Creatinine, Ser 0.93 0.44 - 1.00 mg/dL   Calcium 8.2 (L) 8.9 - 10.3 mg/dL   GFR, Estimated >60 >60 mL/min   Anion gap 7 5 - 15    US Venous Img Lower Unilateral Left (DVT)  Result Date: 09/03/2022 CLINICAL DATA:  Pain and swelling EXAM: Left LOWER EXTREMITY VENOUS DOPPLER ULTRASOUND TECHNIQUE: Gray-scale sonography with compression, as well as color and duplex ultrasound, were performed to evaluate the deep venous system(s) from the level of the common femoral vein through the popliteal and proximal calf veins. COMPARISON:  None Available. FINDINGS: VENOUS Normal compressibility of the common femoral, superficial  femoral, and popliteal veins, as well as the visualized calf veins. Visualized portions of profunda femoral vein and great saphenous vein unremarkable. No filling defects to suggest DVT on grayscale or color Doppler imaging. Doppler waveforms show normal direction of venous flow, normal respiratory plasticity and response to augmentation. Limited views of the contralateral common femoral vein are unremarkable. OTHER None. Limitations: none IMPRESSION: Negative. Electronically Signed   By: Donavan Foil M.D.   On: 09/03/2022 22:39   DG Knee Complete 4 Views Left  Result Date: 09/03/2022 CLINICAL DATA:  Left knee pain. Evaluate for fracture and infection. EXAM: LEFT KNEE - COMPLETE 4+ VIEW COMPARISON:  Bone survey 04/08/2017 FINDINGS: Left knee is located with a moderate sized suprapatellar joint effusion. Mild medial joint space narrowing. Osteophytosis in the medial knee compartment and patellofemoral compartment. Old fracture involving the proximal fibula. Atherosclerotic calcifications in the left lower extremity. IMPRESSION: 1. No acute bone abnormality in the left knee. 2. Moderate-sized joint effusion. 3. 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 4. Old fracture involving the proximal fibula. Electronically Signed   By: Quita Skye  Anselm Pancoast M.D.   On: 09/03/2022 12:50    Assessment/Plan: 2 Days Post-Op   Principal Problem:   Septic arthritis of knee, left (HCC) Active Problems:   Multiple myeloma not having achieved remission ([place])   Essential hypertension   S/P autologous bone marrow transplantation ([place])   Chronic heart failure with preserved ejection fraction (HFpEF) (HCC)   Hyponatremia   Hypothyroidism   COPD (chronic obstructive pulmonary disease) (HCC)   GERD (gastroesophageal reflux disease)   Leg swelling   Up with therapy, no restrictions on weight bearing or range of motion Follow cultures Recommend ID consult for antibiotic choice and duration Dressing change tomorrow Continue polar care  and elevation    Lovell Sheehan , MD 09/05/2022, 11:05 AM

## 2022-09-06 ENCOUNTER — Other Ambulatory Visit
Admission: RE | Admit: 2022-09-06 | Discharge: 2022-09-06 | Disposition: A | Discharge status: Home / Self Care | Payer: 59 | Source: Ambulatory Visit | Attending: Internal Medicine | Admitting: Internal Medicine

## 2022-09-06 ENCOUNTER — Inpatient Hospital Stay: Payer: 59

## 2022-09-06 DIAGNOSIS — J449 Chronic obstructive pulmonary disease, unspecified: Secondary | ICD-10-CM | POA: Diagnosis not present

## 2022-09-06 DIAGNOSIS — M009 Pyogenic arthritis, unspecified: Secondary | ICD-10-CM | POA: Diagnosis not present

## 2022-09-06 DIAGNOSIS — K219 Gastro-esophageal reflux disease without esophagitis: Secondary | ICD-10-CM | POA: Diagnosis not present

## 2022-09-06 DIAGNOSIS — E038 Other specified hypothyroidism: Secondary | ICD-10-CM | POA: Diagnosis not present

## 2022-09-06 LAB — GLUCOSE, BODY FLUID OTHER: Glucose, Body Fluid Other: 6 mg/dL

## 2022-09-06 LAB — BASIC METABOLIC PANEL
Anion gap: 9 (ref 5–15)
BUN: 9 mg/dL (ref 8–23)
CO2: 20 mmol/L — ABNORMAL LOW (ref 22–32)
Calcium: 8.2 mg/dL — ABNORMAL LOW (ref 8.9–10.3)
Chloride: 106 mmol/L (ref 98–111)
Creatinine, Ser: 0.76 mg/dL (ref 0.44–1.00)
GFR, Estimated: >60 mL/min (ref >60)
Glucose, Bld: 99 mg/dL (ref 70–99)
Potassium: 3.9 mmol/L (ref 3.5–5.1)
Sodium: 135 mmol/L (ref 135–145)

## 2022-09-06 LAB — LD, BODY FLUID (OTHER): LD, Body Fluid: 2213 IU/L

## 2022-09-06 LAB — KAPPA/LAMBDA LIGHT CHAINS
Kappa free light chain: 6.7 mg/L (ref 3.3–19.4)
Kappa, lambda light chain ratio: 0.02 — ABNORMAL LOW (ref 0.26–1.65)
Lambda free light chains: 287 mg/L — ABNORMAL HIGH (ref 5.7–26.3)

## 2022-09-06 LAB — MRSA NEXT GEN BY PCR, NASAL: MRSA by PCR Next Gen: NOT DETECTED

## 2022-09-06 MED ORDER — SODIUM CHLORIDE 0.9 % IV SOLN
2.0000 g | INTRAVENOUS | Status: DC
  Administered 2022-09-07 – 2022-09-10 (×4): 2 g via INTRAVENOUS
  Filled 2022-09-06 (×4): qty 20

## 2022-09-06 MED ORDER — ENOXAPARIN SODIUM 40 MG/0.4ML IJ SOSY
40.0000 mg | PREFILLED_SYRINGE | INTRAMUSCULAR | Status: DC
  Administered 2022-09-06 – 2022-09-09 (×4): 40 mg via SUBCUTANEOUS
  Filled 2022-09-06 (×4): qty 0.4

## 2022-09-06 NOTE — Consult Note (Signed)
NAME: Brandi [name]  DOB: 1951/02/11  MRN: [id number]  Date/Time: 09/06/2022 1:23 PM  REQUESTING PROVIDER: Dr.Alexander Subjective:  REASON FOR CONSULT: strep pneumo septic arthritis  Chart reviewed and spoke to the patient ? Brandi [name] is a 72 y.o. 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 Pt denied any trauma Had some fever No intraatricular steroid injection  Vitals in the ED  09/03/22 10:21  BP 132/69  Temp 101 F (38.3 C) !  Pulse Rate 85  Resp 16  SpO2 100 %     Latest Reference Range & Units 09/03/22 11:47  WBC 4.0 - 10.5 K/uL 10.0  Hemoglobin 12.0 - 15.0 g/dL 9.5 (L)  HCT 36.0 - 46.0 % 30.2 (L)  Platelets 150 - 400 K/uL 170  Creatinine 0.44 - 1.00 mg/dL 0.90   EDPaspirated the left knee and analysis was  Latest Reference Range & Units 09/03/22 13:05  Color, Synovial YELLOW  YELLOW !  Appearance-Synovial CLEAR  CLOUDY !  Crystals, Fluid  NO CRYSTALS SEEN  WBC, Synovial 0 - 200 /cu mm 72,482 (H)  Neutrophil, Synovial % 96   Fluid culture positive for strep pneumo Underwent arthroscopic knee debridement on 09/03/22 Pt is currently on vanco and cefepime I am asked to see the patient for septic arthritis Past Medical History:  Diagnosis Date   (HFpEF) heart failure with preserved ejection fraction ([place])    a. 06/2020 Echo: EF 60-65%, no rwma, nl RV size/fxn. Triv MR. Triv TR/AI. Mod elev PASP.   Anxiety    COPD (chronic obstructive pulmonary disease) (HCC)    GERD (gastroesophageal reflux disease)    Hypertension    Hyperthyroidism    Hypokalemia    IgA myeloma (HCC)    Multiple myeloma (HCC)    Pneumonia    Red blood cell  antibody positive, compatible PRBC difficult to obtain    S/P autologous bone marrow transplantation [facility])     Past Surgical History:  Procedure Laterality Date   ANTERIOR VITRECTOMY Left 10/07/2021   Procedure: ANTERIOR VITRECTOMY;  Surgeon: Leandrew Koyanagi, MD;  Location: [place];  Service: Ophthalmology;  Laterality: Left;   CATARACT EXTRACTION W/PHACO Left 10/07/2021   Procedure: CATARACT EXTRACTION PHACO AND INTRAOCULAR LENS PLACEMENT ([place]) LEFT VISION BLUE;  Surgeon: Leandrew Koyanagi, MD;  Location: [place];  Service: Ophthalmology;  Laterality: Left;  kit for manual incision 14.56 01:21.4   CATARACT EXTRACTION W/PHACO Right 10/21/2021   Procedure: CATARACT EXTRACTION PHACO AND INTRAOCULAR LENS PLACEMENT (IOC) RIGHT 11.12 01:48.1;  Surgeon: Leandrew Koyanagi, MD;  Location: [place];  Service: Ophthalmology;  Laterality: Right;   ESOPHAGOGASTRODUODENOSCOPY (EGD) WITH PROPOFOL N/A 03/30/2019   Procedure: ESOPHAGOGASTRODUODENOSCOPY (EGD) WITH PROPOFOL;  Surgeon: Jonathon Bellows, MD;  Location: [facility] ENDOSCOPY;  Service: Gastroenterology;  Laterality: N/A;   IR FLUORO GUIDE PORT INSERTION RIGHT  12/16/2017   IR IMAGING GUIDED PORT INSERTION  06/15/2021   KNEE ARTHROSCOPY Left 09/03/2022   Procedure: ARTHROSCOPY KNEE DEBRIDEMENT;  Surgeon: Lovell Sheehan, MD;  Location: [facility];  Service: Orthopedics;  Laterality: Left;   THYROIDECTOMY N/A     Social History   Socioeconomic History   Marital status: Single    Spouse name:  Not on file   Number of children: 3   Years of education: Not on file   Highest education level: Not on file  Occupational History   Not on file  Tobacco Use   Smoking status: Former    Packs/day: 0.25    Types: Cigarettes    Quit date: 03/18/2021    Years since quitting: 1.4   Smokeless tobacco: Never   Tobacco comments:    2 cigarettes QOD  Vaping Use   Vaping Use: Never used  Substance and Sexual Activity    Alcohol use: No   Drug use: No   Sexual activity: Not Currently  Other Topics Concern   Not on file  Social History Narrative   Lives at home with family(son): daughter 8 blocks away. States "I'm never alone." Independent at baseline   Social Determinants of Health   Financial Resource Strain: Not on file  Food Insecurity: No Food Insecurity (09/04/2022)   Hunger Vital Sign    Worried About Running Out of Food in the Last Year: Never true    Ran Out of Food in the Last Year: Never true  Transportation Needs: No Transportation Needs (09/04/2022)   PRAPARE - Hydrologist (Medical): No    Lack of Transportation (Non-Medical): No  Physical Activity: Not on file  Stress: Not on file  Social Connections: Not on file  Intimate Partner Violence: Not At Risk (09/04/2022)   Humiliation, Afraid, Rape, and Kick questionnaire    Fear of Current or Ex-Partner: No    Emotionally Abused: No    Physically Abused: No    Sexually Abused: No    Family History  Problem Relation Age of Onset   Pneumonia Mother    Seizures Father    No Known Allergies I? Current Facility-Administered Medications  Medication Dose Route Frequency Provider Last Rate Last Admin   acetaminophen (TYLENOL) tablet 650 mg  650 mg Oral Q6H PRN Cox, Amy N, DO   650 mg at 09/04/22 1949   Or   acetaminophen (TYLENOL) suppository 650 mg  650 mg Rectal Q6H PRN Cox, Amy N, DO       buPROPion (WELLBUTRIN) tablet 75 mg  75 mg Oral Daily Cox, Amy N, DO   75 mg at 09/06/22 0917   carvedilol (COREG) tablet 3.125 mg  3.125 mg Oral BID WC Cox, Amy N, DO   3.125 mg at 09/06/22 0758   ceFEPIme (MAXIPIME) 2 g in sodium chloride 0.9 % 100 mL IVPB  2 g Intravenous Q12H Benita Gutter, RPH 200 mL/hr at 09/06/22 0505 2 g at 09/06/22 0505   Chlorhexidine Gluconate Cloth 2 % PADS 6 each  6 each Topical Daily Emeterio Reeve, DO   6 each at 09/06/22 1000   docusate sodium (COLACE) capsule 100 mg  100 mg Oral BID  Cox, Amy N, DO   100 mg at 09/06/22 0917   feeding supplement (ENSURE ENLIVE / ENSURE PLUS) liquid 237 mL  237 mL Oral TID BM Emeterio Reeve, DO   237 mL at 09/06/22 0917   heparin injection 5,000 Units  5,000 Units Subcutaneous Q8H Cox, Amy N, DO   5,000 Units at 09/06/22 0507   hydrALAZINE (APRESOLINE) injection 5 mg  5 mg Intravenous Q8H PRN Cox, Amy N, DO       ipratropium-albuterol (DUONEB) 0.5-2.5 (3) MG/3ML nebulizer solution 3 mL  3 mL Nebulization Q6H PRN Cox, Amy N, DO       levothyroxine (  SYNTHROID) tablet 100 mcg  100 mcg Oral Q0600 Cox, Amy N, DO   100 mcg at 09/06/22 0506   metoCLOPramide (REGLAN) tablet 5-10 mg  5-10 mg Oral Q8H PRN Cox, Amy N, DO       Or   metoCLOPramide (REGLAN) injection 5-10 mg  5-10 mg Intravenous Q8H PRN Cox, Amy N, DO       montelukast (SINGULAIR) tablet 10 mg  10 mg Oral QHS Cox, Amy N, DO   10 mg at 09/05/22 2155   morphine (PF) 2 MG/ML injection 2 mg  2 mg Intravenous Q4H PRN Emeterio Reeve, DO   2 mg at 09/06/22 1234   multivitamin with minerals tablet 1 tablet  1 tablet Oral Daily Emeterio Reeve, DO   1 tablet at 09/06/22 0917   ondansetron (ZOFRAN) tablet 4 mg  4 mg Oral Q6H PRN Cox, Amy N, DO       Or   ondansetron (ZOFRAN) injection 4 mg  4 mg Intravenous Q6H PRN Cox, Amy N, DO       ondansetron (ZOFRAN) tablet 4 mg  4 mg Oral Q6H PRN Cox, Amy N, DO       Or   ondansetron (ZOFRAN) injection 4 mg  4 mg Intravenous Q6H PRN Cox, Amy N, DO       oxyCODONE (Oxy IR/ROXICODONE) immediate release tablet 10 mg  10 mg Oral Q6H PRN Emeterio Reeve, DO   10 mg at 09/06/22 0758   polyethylene glycol (MIRALAX / GLYCOLAX) packet 17 g  17 g Oral Daily PRN Emeterio Reeve, DO   17 g at 09/05/22 [id number]   senna-docusate (Senokot-S) tablet 1 tablet  1 tablet Oral QHS PRN Cox, Amy N, DO       spironolactone (ALDACTONE) tablet 25 mg  25 mg Oral Daily Cox, Amy N, DO   25 mg at 09/06/22 [id number]   vancomycin (VANCOREADY) IVPB 1250 mg/250 mL  1,250 mg  Intravenous Q24H Cox, Amy N, DO   Stopped at 09/05/22 1843   Facility-Administered Medications Ordered in Other Encounters  Medication Dose Route Frequency Provider Last Rate Last Admin   0.9 %  sodium chloride infusion   Intravenous Continuous Monia Sabal, PA-C         Abtx:  Anti-infectives (From admission, onward)    Start     Dose/Rate Route Frequency Ordered Stop   09/05/22 1752  ceFEPIme (MAXIPIME) 2 g in sodium chloride 0.9 % 100 mL IVPB        2 g 200 mL/hr over 30 Minutes Intravenous Every 12 hours 09/05/22 0737     09/04/22 1700  vancomycin (VANCOREADY) IVPB 1250 mg/250 mL        1,250 mg 166.7 mL/hr over 90 Minutes Intravenous Every 24 hours 09/03/22 1536     09/04/22 1400  ceFEPIme (MAXIPIME) 2 g in sodium chloride 0.9 % 100 mL IVPB  Status:  Discontinued        2 g 200 mL/hr over 30 Minutes Intravenous Every 8 hours 09/04/22 1242 09/05/22 0738   09/04/22 0600  ceFEPIme (MAXIPIME) 2 g in sodium chloride 0.9 % 100 mL IVPB  Status:  Discontinued        2 g 200 mL/hr over 30 Minutes Intravenous Every 12 hours 09/03/22 1537 09/04/22 1242   09/03/22 1600  vancomycin (VANCOREADY) IVPB 1750 mg/350 mL  Status:  Discontinued        1,750 mg 175 mL/hr over 120 Minutes Intravenous  Once 09/03/22 1524 09/03/22 1852  09/03/22 1500  ceFEPIme (MAXIPIME) 2 g in sodium chloride 0.9 % 100 mL IVPB        2 g 200 mL/hr over 30 Minutes Intravenous  Once 09/03/22 1453 09/03/22 1651       REVIEW OF SYSTEMS:  Const:  fever, chills, negative weight loss Eyes: negative diplopia or visual changes, negative eye pain ENT: negative coryza, negative sore throat Resp: mild copd- Cards: negative for chest pain, palpitations, lower extremity edema GU: negative for frequency, dysuria and hematuria GI: Negative for abdominal pain, diarrhea, bleeding, constipation Skin: negative for rash and pruritus Heme: negative for easy bruising and gum/nose bleeding MS: left knee pain, left calf  pain Neurolo:negative for headaches, dizziness, vertigo, memory problems  Psych: negative for feelings of anxiety, depression  Endocrine: hypothyroidism Allergy/Immunology- negative for any medication or food allergies ?  Objective:  VITALS:  BP 100/64 (BP Location: Right Arm)   Pulse 70   Temp 98.7 F (37.1 C) (Oral)   Resp 15   Ht 5' 2"$  (1.575 m)   Wt 77.6 kg   SpO2 99%   BMI 31.29 kg/m  Pertinent Labs Lab Results CBC    Component Value Date/Time   WBC 5.6 09/05/2022 0341   RBC 2.88 (L) 09/05/2022 0341   HGB 8.5 (L) 09/05/2022 0341   HCT 27.0 (L) 09/05/2022 0341   PLT 124 (L) 09/05/2022 0341   MCV 93.8 09/05/2022 0341   MCH 29.5 09/05/2022 0341   MCHC 31.5 09/05/2022 0341   RDW 15.6 (H) 09/05/2022 0341   LYMPHSABS 2.1 09/03/2022 1147   MONOABS 0.6 09/03/2022 1147   EOSABS 0.2 09/03/2022 1147   BASOSABS 0.0 09/03/2022 1147       Latest Ref Rng & Units 09/06/2022    5:04 AM 09/05/2022    3:41 AM 09/04/2022    6:04 AM  CMP  Glucose 70 - 99 mg/dL 99  105  105   BUN 8 - 23 mg/dL 9  10  8   $ Creatinine 0.44 - 1.00 mg/dL 0.76  0.93  0.84   Sodium 135 - 145 mmol/L 135  135  135   Potassium 3.5 - 5.1 mmol/L 3.9  3.0  3.6   Chloride 98 - 111 mmol/L 106  106  105   CO2 22 - 32 mmol/L 20  22  21   $ Calcium 8.9 - 10.3 mg/dL 8.2  8.2  7.7       Microbiology: Recent Results (from the past 240 hour(s))  Body fluid culture w Gram Stain     Status: None (Preliminary result)   Collection Time: 09/03/22  1:05 PM   Specimen: KNEE; Body Fluid  Result Value Ref Range Status   Specimen Description   Final    KNEE Performed at [facility], [address], [place], [place] [address]    Special Requests   Final    Immunocompromised Performed at [facility], [address] [place], [place] [address]    Gram Stain   Final    RARE WBC PRESENT, PREDOMINANTLY PMN NO ORGANISMS SEEN    Culture   Final    RARE STREPTOCOCCUS PNEUMONIAE SUSCEPTIBILITIES TO  FOLLOW CRITICAL RESULT CALLED TO, READ BACK BY AND VERIFIED WITH: RN SHON.C AT 1145 ON 09/05/2022 BY T.SAAD. Performed at [facility], [place] [address], [place], [place] [address]    Report Status PENDING  Incomplete  MRSA Next Gen by PCR, Nasal     Status: None   Collection  Time: 09/06/22  9:23 AM   Specimen: Nasal Mucosa; Nasal Swab  Result Value Ref Range Status   MRSA by PCR Next Gen NOT DETECTED NOT DETECTED Final    Comment: (NOTE) The GeneXpert MRSA Assay (FDA approved for NASAL specimens only), is one component of a comprehensive MRSA colonization surveillance program. It is not intended to diagnose MRSA infection nor to guide or monitor treatment for MRSA infections. Test performance is not FDA approved in patients less than 42 years old. Performed at [facility], [address] [place], [place] [address]     IMAGING RESULTS:  Xray left knee Moderate effusion Also reported old fracture of fibula I have personally reviewed the films ? Impression/Recommendation ?streptococcus pneumoniae septic arthritis of the left knee is a patient with Myeloma Previous h/o pneumococcus bacteremia in 2022 S/p Arthroscopic debridement of left knee Source not clear, but because of myeloma the risk for pneumococcal infection is increased- also she has COPD No blood culture sent on admission to look for bacteremia No Cxr done Will get both Currently on vanco and cefepime- will DC vanco Change cefepime to ceftriaxone Await MIC/susceptibility   Multiple myeloma- on chemo  Hypothyroidism secondary to total thyroidectomy for Grave's disease  Discussed the management with patient in detail Discussed with care team ? ? ___________________________________________________ Discussed with patient, requesting provider Note:  This document was prepared using Dragon voice recognition software and may include unintentional dictation errors.

## 2022-09-06 NOTE — Progress Notes (Signed)
PROGRESS NOTE    Brandi [name]   [id number] DOB: [birth year]  DOA: 09/03/2022 Date of Service: 09/06/22 PCP: Brandi Merles, MD     Brief Narrative / Hospital Course:  Brandi [name] is a 72 year old female with history of multiple myeloma, who presents from oncology clinic for chief concerns of 3 days of worsening left knee pain. 02/09: to ED, admitted to hospitalist service, to OR for arthroscopic joint washout w/ Brandi [name]. Harlow [name], continued on vancomycin and cefepime pending joint fluid cultures. DVT ruled out.  02/10: PT recs for SNF but pt states she is not interested in this at this time. Awaiting Cx from joint aspirate. Pain control.  02/11: Cx (+)strep pneumo, reincubated. Ortho recs for ID consult  02/12: ID to see today.   Consultants:  Orthopedics  Procedures: 09/03/2022 joint aspiration in ED  09/03/2022 arthroscopic L knee debridement       ASSESSMENT & PLAN:   Principal Problem:   Septic arthritis of knee, left ([place]) Active Problems:   Multiple myeloma not having achieved remission ([place])   Essential hypertension   S/P autologous bone marrow transplantation ([place])   Chronic heart failure with preserved ejection fraction (HFpEF) (HCC)   Hyponatremia   Hypothyroidism   COPD (chronic obstructive pulmonary disease) (HCC)   GERD (gastroesophageal reflux disease)   Leg swelling  Septic arthritis of knee, left (HCC) DVT r/o Body fluid from left knee aspirate (+) strep pneumo   Continue vancomycin and cefepime pending ID consult - today 09/06/22 is Day 4 Pain control: Acetaminophen 650 mg p.o. every 6 hours as needed for mild pain, fever, headache; oxycodone started yesterday 10 mg q4h prn mod-severe pain and morphine for breakthrough pain, increased interval today on po meds to q6h prn and plan to continue to taper dose/frequency   Hypokalemia Replaced Monitor BMP  Hypothyroidism Levothyroxine 100 mcg daily resumed before breakfast  Essential  hypertension Coreg 3.125 mg p.o. twice daily resumed Hydralazine 5 mg every 8 hours as needed for SBP greater than 180, 4 days ordered  Chronic heart failure with preserved ejection fraction (HFpEF) (HCC) Patient not in acute exacerbation Strict I's and O's  COPD (chronic obstructive pulmonary disease) ([place]) Patient is not in acute exacerbation DuoNebs every 6 hours as needed for shortness of breath and wheezing  Hyponatremia - resolved w /IV fluids  BMP in a.m.    DVT prophylaxis: lovenox  Pertinent IV fluids/nutrition: no continuous IV fluids, tolerating diet  Central lines / invasive devices: none  Code Status: FULL CODE  Current Admission Status: inpatient  TOC needs / Dispo plan: SNF vs HH  Barriers to discharge / significant pending items: pain control, culture results, possible placement              Subjective / Brief ROS:  Patient reports severe pain in L knee persists, it's a bit better than yesterday Denies CP/SOB.   Denies new weakness.  Tolerating diet.  Reports no concerns w/ urination/defecation.  Reports does not want to go to SNF, "I have lots of grand kids that can take care of me at home"   Family Communication: none at this time     Objective Findings:  Vitals:   09/05/22 0817 09/05/22 1652 09/06/22 0041 09/06/22 0735  BP: 102/62 110/62 117/63 100/64  Pulse: 74 78 72 70  Resp: 18 18 17 15  $ Temp: 99.4 F (37.4 C) 99 F (37.2 C) 98.8 F (37.1 C) 98.7 F (37.1 C)  TempSrc:   Oral Oral  SpO2: 97% 98% 97% 99%  Weight:      Height:        Intake/Output Summary (Last 24 hours) at 09/06/2022 1324 Last data filed at 09/06/2022 0800 Gross per 24 hour  Intake 780 ml  Output 1600 ml  Net -820 ml   Filed Weights   09/03/22 1143 09/03/22 1557  Weight: 77.6 kg 77.6 kg    Examination:  Physical Exam Constitutional:      General: She is not in acute distress.    Appearance: Normal appearance. She is obese. She is not ill-appearing.   Cardiovascular:     Rate and Rhythm: Normal rate and regular rhythm.     Heart sounds: Murmur heard.  Pulmonary:     Effort: Pulmonary effort is normal. No respiratory distress.     Breath sounds: Normal breath sounds.  Abdominal:     General: Abdomen is flat. Bowel sounds are normal.     Palpations: Abdomen is soft.  Musculoskeletal:     Comments: LLE was in cold compressive device and is bandaged - these were not disturbed, no edema or erythema extending proximally   Skin:    General: Skin is warm and dry.  Neurological:     General: No focal deficit present.     Mental Status: She is alert and oriented to person, place, and time. Mental status is at baseline.  Psychiatric:        Mood and Affect: Mood normal.        Behavior: Behavior normal.        Thought Content: Thought content normal.          Scheduled Medications:   buPROPion  75 mg Oral Daily   carvedilol  3.125 mg Oral BID WC   Chlorhexidine Gluconate Cloth  6 each Topical Daily   docusate sodium  100 mg Oral BID   enoxaparin (LOVENOX) injection  40 mg Subcutaneous Q24H   feeding supplement  237 mL Oral TID BM   levothyroxine  100 mcg Oral Q0600   montelukast  10 mg Oral QHS   multivitamin with minerals  1 tablet Oral Daily   spironolactone  25 mg Oral Daily    Continuous Infusions:  ceFEPime (MAXIPIME) IV 2 g (09/06/22 0505)   vancomycin Stopped (09/05/22 1843)    PRN Medications:  acetaminophen **OR** acetaminophen, hydrALAZINE, ipratropium-albuterol, metoCLOPramide **OR** metoCLOPramide (REGLAN) injection, morphine injection, ondansetron **OR** ondansetron (ZOFRAN) IV, ondansetron **OR** ondansetron (ZOFRAN) IV, oxyCODONE, polyethylene glycol, senna-docusate  Antimicrobials from admission:  Anti-infectives (From admission, onward)    Start     Dose/Rate Route Frequency Ordered Stop   09/05/22 1752  ceFEPIme (MAXIPIME) 2 g in sodium chloride 0.9 % 100 mL IVPB        2 g 200 mL/hr over 30 Minutes  Intravenous Every 12 hours 09/05/22 0737     09/04/22 1700  vancomycin (VANCOREADY) IVPB 1250 mg/250 mL        1,250 mg 166.7 mL/hr over 90 Minutes Intravenous Every 24 hours 09/03/22 1536     09/04/22 1400  ceFEPIme (MAXIPIME) 2 g in sodium chloride 0.9 % 100 mL IVPB  Status:  Discontinued        2 g 200 mL/hr over 30 Minutes Intravenous Every 8 hours 09/04/22 1242 09/05/22 0738   09/04/22 0600  ceFEPIme (MAXIPIME) 2 g in sodium chloride 0.9 % 100 mL IVPB  Status:  Discontinued        2 g 200 mL/hr over 30 Minutes Intravenous  Every 12 hours 09/03/22 1537 09/04/22 1242   09/03/22 1600  vancomycin (VANCOREADY) IVPB 1750 mg/350 mL  Status:  Discontinued        1,750 mg 175 mL/hr over 120 Minutes Intravenous  Once 09/03/22 1524 09/03/22 1852   09/03/22 1500  ceFEPIme (MAXIPIME) 2 g in sodium chloride 0.9 % 100 mL IVPB        2 g 200 mL/hr over 30 Minutes Intravenous  Once 09/03/22 1453 09/03/22 1651           Data Reviewed:  I have personally reviewed the following...  CBC: Recent Labs  Lab 09/03/22 0947 09/03/22 1147 09/04/22 0604 09/05/22 0341  WBC 10.7* 10.0 7.6 5.6  NEUTROABS 6.4 7.0  --   --   HGB 9.2* 9.5* 8.4* 8.5*  HCT 28.4* 30.2* 26.5* 27.0*  MCV 93.7 94.7 95.3 93.8  PLT 165 170 142* [id number]*   Basic Metabolic Panel: Recent Labs  Lab 09/03/22 0947 09/03/22 1147 09/03/22 1910 09/04/22 0604 09/05/22 0341 09/06/22 0504  NA 131* 130*  --  135 135 135  K 3.2* 3.5  --  3.6 3.0* 3.9  CL 97* 96*  --  105 106 106  CO2 20* 22  --  21* 22 20*  GLUCOSE 108* 111* 97 105* 105* 99  BUN 9 9  --  8 10 9  $ CREATININE 0.83 0.90  --  0.84 0.93 0.76  CALCIUM 8.1* 8.3*  --  7.7* 8.2* 8.2*   GFR: Estimated Creatinine Clearance: 63.1 mL/min (by C-G formula based on SCr of 0.76 mg/dL). Liver Function Tests: Recent Labs  Lab 09/03/22 0947  AST 21  ALT 20  ALKPHOS 92  BILITOT 0.7  PROT 8.8*  ALBUMIN 3.5   No results for input(s): "LIPASE", "AMYLASE" in the last 168  hours. No results for input(s): "AMMONIA" in the last 168 hours. Coagulation Profile: No results for input(s): "INR", "PROTIME" in the last 168 hours. Cardiac Enzymes: No results for input(s): "CKTOTAL", "CKMB", "CKMBINDEX", "TROPONINI" in the last 168 hours. BNP (last 3 results) No results for input(s): "PROBNP" in the last 8760 hours. HbA1C: No results for input(s): "HGBA1C" in the last 72 hours. CBG: No results for input(s): "GLUCAP" in the last 168 hours. Lipid Profile: No results for input(s): "CHOL", "HDL", "LDLCALC", "TRIG", "CHOLHDL", "LDLDIRECT" in the last 72 hours. Thyroid Function Tests: No results for input(s): "TSH", "T4TOTAL", "FREET4", "T3FREE", "THYROIDAB" in the last 72 hours. Anemia Panel: No results for input(s): "VITAMINB12", "FOLATE", "FERRITIN", "TIBC", "IRON", "RETICCTPCT" in the last 72 hours. Most Recent Urinalysis On File:     Component Value Date/Time   COLORURINE STRAW (A) 05/25/2021 0049   APPEARANCEUR CLEAR (A) 05/25/2021 0049   LABSPEC 1.008 05/25/2021 0049   PHURINE 5.0 05/25/2021 0049   GLUCOSEU NEGATIVE 05/25/2021 0049   HGBUR MODERATE (A) 05/25/2021 0049   BILIRUBINUR NEGATIVE 05/25/2021 [place] 05/25/2021 0049   PROTEINUR 30 (A) 05/25/2021 0049   NITRITE NEGATIVE 05/25/2021 0049   LEUKOCYTESUR NEGATIVE 05/25/2021 0049   Sepsis Labs: @LABRCNTIP$ (procalcitonin:4,lacticidven:4) Microbiology: Recent Results (from the past 240 hour(s))  Body fluid culture w Gram Stain     Status: None (Preliminary result)   Collection Time: 09/03/22  1:05 PM   Specimen: KNEE; Body Fluid  Result Value Ref Range Status   Specimen Description   Final    KNEE Performed at [facility], [address] [place], [place] [address]    Special Requests   Final  Immunocompromised Performed at [facility], [address] [place], [place] [address]    Gram Stain   Final    RARE WBC PRESENT, PREDOMINANTLY PMN NO ORGANISMS  SEEN    Culture   Final    RARE STREPTOCOCCUS PNEUMONIAE SUSCEPTIBILITIES TO FOLLOW CRITICAL RESULT CALLED TO, READ BACK BY AND VERIFIED WITH: RN SHON.C AT 1145 ON 09/05/2022 BY T.SAAD. Performed at [facility], [place] [address] [place], [place] [address]    Report Status PENDING  Incomplete  MRSA Next Gen by PCR, Nasal     Status: None   Collection Time: 09/06/22  9:23 AM   Specimen: Nasal Mucosa; Nasal Swab  Result Value Ref Range Status   MRSA by PCR Next Gen NOT DETECTED NOT DETECTED Final    Comment: (NOTE) The GeneXpert MRSA Assay (FDA approved for NASAL specimens only), is one component of a comprehensive MRSA colonization surveillance program. It is not intended to diagnose MRSA infection nor to guide or monitor treatment for MRSA infections. Test performance is not FDA approved in patients less than 41 years old. Performed at [facility], [address], [place], [place] [address]       Radiology Studies last 3 days: US Venous Img Lower Unilateral Left (DVT)  Result Date: 09/03/2022 CLINICAL DATA:  Pain and swelling EXAM: Left LOWER EXTREMITY VENOUS DOPPLER ULTRASOUND TECHNIQUE: Gray-scale sonography with compression, as well as color and duplex ultrasound, were performed to evaluate the deep venous system(s) from the level of the common femoral vein through the popliteal and proximal calf veins. COMPARISON:  None Available. FINDINGS: VENOUS Normal compressibility of the common femoral, superficial femoral, and popliteal veins, as well as the visualized calf veins. Visualized portions of profunda femoral vein and great saphenous vein unremarkable. No filling defects to suggest DVT on grayscale or color Doppler imaging. Doppler waveforms show normal direction of venous flow, normal respiratory plasticity and response to augmentation. Limited views of the contralateral common femoral vein are unremarkable. OTHER None. Limitations: none IMPRESSION: Negative.  Electronically Signed   By: Donavan Foil M.D.   On: 09/03/2022 22:39   DG Knee Complete 4 Views Left  Result Date: 09/03/2022 CLINICAL DATA:  Left knee pain. Evaluate for fracture and infection. EXAM: LEFT KNEE - COMPLETE 4+ VIEW COMPARISON:  Bone survey 04/08/2017 FINDINGS: Left knee is located with a moderate sized suprapatellar joint effusion. Mild medial joint space narrowing. Osteophytosis in the medial knee compartment and patellofemoral compartment. Old fracture involving the proximal fibula. Atherosclerotic calcifications in the left lower extremity. IMPRESSION: 1. No acute bone abnormality in the left knee. 2. Moderate-sized joint effusion. 3. 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 4. Old fracture involving the proximal fibula. Electronically Signed   By: Markus Daft M.D.   On: 09/03/2022 12:50             LOS: 3 days       Emeterio Reeve, DO [facility] 09/06/2022, 1:24 PM    Dictation software may have been used to generate the above note. Typos may occur and escape review in typed/dictated notes. Please contact Brandi [name] [place] directly for clarity if needed.  Staff may message me via secure chat in [place]  but this may not receive an immediate response,  please page me for urgent matters!  If 7PM-7AM, please contact night coverage [email or web address]

## 2022-09-06 NOTE — Care Management Important Message (Signed)
Important Message  Patient Details  Name: Brandi [name] MRN: [id number] Date of Birth: 1951-06-08   Medicare Important Message Given:  Yes     Dannette Barbara 09/06/2022, 12:43 PM

## 2022-09-06 NOTE — Progress Notes (Signed)
Physical Therapy Treatment Patient Details Name: Brandi [name] MRN: [id number] DOB: 09-20-1950 Today's Date: 09/06/2022   History of Present Illness Pt is a 72 year old female with PMH that includes COPD, CHF, HTN, and multiple myeloma who presents from oncology clinic for chief concerns of 3 days of worsening left knee pain. MD assessment includes hyponatremia and septic left knee joint with pt now s/p left arthroscopic knee debridement.    PT Comments    Pt reported 10/10 L knee pain despite being pre-medicated for pain and declined all but below supine therex.  Pt education provided on benefits of mobility, L knee ROM, and time in chair but pt continued to decline any movement of her LLE.  Pt required significant encouragement even to participate with below therex but ended up putting forth fair effort.  Pt educated on benefits of SNF prior to returning home for patient and caregiver safety with pt adamantly refusing rehab.  Pt will benefit from PT services in a SNF setting upon discharge to safely address deficits listed in patient problem list for decreased caregiver assistance and eventual return to PLOF.    Recommendations for follow up therapy are one component of a multi-disciplinary discharge planning process, led by the attending physician.  Recommendations may be updated based on patient status, additional functional criteria and insurance authorization.  Follow Up Recommendations  Skilled nursing-short term rehab (<3 hours/day) Can patient physically be transported by private vehicle: No   Assistance Recommended at Discharge Frequent or constant Supervision/Assistance  Patient can return home with the following Two people to help with walking and/or transfers;A lot of help with bathing/dressing/bathroom;Assistance with cooking/housework;Assist for transportation;Help with stairs or ramp for entrance   Equipment Recommendations  Wheelchair (measurements PT)    Recommendations  for Other Services       Precautions / Restrictions Precautions Precautions: Fall Restrictions Weight Bearing Restrictions: Yes LLE Weight Bearing: Weight bearing as tolerated     Mobility  Bed Mobility               General bed mobility comments: Pt refused all but below therex secondary to L knee pain    Transfers                        Ambulation/Gait                   Stairs             Wheelchair Mobility    Modified Rankin (Stroke Patients Only)       Balance                                            Cognition Arousal/Alertness: Awake/alert Behavior During Therapy: WFL for tasks assessed/performed Overall Cognitive Status: Within Functional Limits for tasks assessed                                          Exercises Total Joint Exercises Ankle Circles/Pumps: AROM, Strengthening, Both, 10 reps (manual resistance on the RLE) Quad Sets: Strengthening, Both, 10 reps Gluteal Sets: Strengthening, Both, 10 reps Heel Slides: AROM, Strengthening, Right, 5 reps Hip ABduction/ADduction: AAROM, Strengthening, Right, 10 reps Straight Leg Raises: AAROM, Strengthening, Right, 10 reps Bridges: Strengthening, Right, 5 reps (  small amplitude) Other Exercises Other Exercises: Pt education provided on physiological benefits of activity and time OOB    General Comments        Pertinent Vitals/Pain Pain Assessment Pain Assessment: 0-10 Pain Score: 10-Worst pain ever Pain Location: L knee Pain Descriptors / Indicators: Aching, Sore Pain Intervention(s): Premedicated before session, Monitored during session, Ice applied    Home Living                          Prior Function            PT Goals (current goals can now be found in the care plan section) Progress towards PT goals: Not progressing toward goals - comment (limited by L knee pain)    Frequency    Min 2X/week      PT  Plan Current plan remains appropriate    Co-evaluation              AM-PAC PT "6 Clicks" Mobility   Outcome Measure  Help needed turning from your back to your side while in a flat bed without using bedrails?: A Little Help needed moving from lying on your back to sitting on the side of a flat bed without using bedrails?: A Little Help needed moving to and from a bed to a chair (including a wheelchair)?: A Lot Help needed standing up from a chair using your arms (e.g., wheelchair or bedside chair)?: A Lot Help needed to walk in hospital room?: Total Help needed climbing 3-5 steps with a railing? : Total 6 Click Score: 12    End of Session   Activity Tolerance: Patient limited by pain Patient left: in bed;with call bell/phone within reach;with bed alarm set;with SCD's reapplied Nurse Communication: Mobility status PT Visit Diagnosis: Other abnormalities of gait and mobility (R26.89);Muscle weakness (generalized) (M62.81);Pain Pain - Right/Left: Left Pain - part of body: Knee     Time: [id number] PT Time Calculation (min) (ACUTE ONLY): 19 min  Charges:  $Therapeutic Exercise: 8-22 mins                    D. Royetta Asal PT, DPT 09/06/22, 4:04 PM

## 2022-09-06 NOTE — TOC CM/SW Note (Signed)
    Durable Medical Equipment  (From admission, onward)           Start     Ordered   09/06/22 1524  For home use only DME standard manual wheelchair with seat cushion  Once       Comments: Patient suffers from septic arthritis which impairs their ability to perform daily activities like bathing, dressing, grooming, and toileting in the home.  A cane, crutch, or walker will not resolve issue with performing activities of daily living. A wheelchair will allow patient to safely perform daily activities. Patient can safely propel the wheelchair in the home or has a caregiver who can provide assistance. Length of need 6 months . Accessories: elevating leg rests (ELRs), wheel locks, extensions and anti-tippers.   09/06/22 1523

## 2022-09-06 NOTE — Progress Notes (Signed)
  Subjective:  Patient reports pain as moderate.  No other complaints  Objective:   VITALS:   Vitals:   09/05/22 1652 09/06/22 0041 09/06/22 0735 09/06/22 1616  BP: 110/62 117/63 100/64 134/60  Pulse: 78 72 70 73  Resp: '18 17 15 17  '$ Temp: 99 F (37.2 C) 98.8 F (37.1 C) 98.7 F (37.1 C) 99.2 F (37.3 C)  TempSrc:  Oral Oral   SpO2: 98% 97% 99% 100%  Weight:      Height:        PHYSICAL EXAM:  Sensation intact distally Dorsiflexion/Plantar flexion intact Incision: dressing C/D/I No cellulitis present Compartment soft  LABS  Results for orders placed or performed during the hospital encounter of 09/03/22 (from the past 24 hour(s))  Basic metabolic panel     Status: Abnormal   Collection Time: 09/06/22  5:04 AM  Result Value Ref Range   Sodium 135 135 - 145 mmol/L   Potassium 3.9 3.5 - 5.1 mmol/L   Chloride 106 98 - 111 mmol/L   CO2 20 (L) 22 - 32 mmol/L   Glucose, Bld 99 70 - 99 mg/dL   BUN 9 8 - 23 mg/dL   Creatinine, Ser 0.76 0.44 - 1.00 mg/dL   Calcium 8.2 (L) 8.9 - 10.3 mg/dL   GFR, Estimated >60 >60 mL/min   Anion gap 9 5 - 15  MRSA Next Gen by PCR, Nasal     Status: None   Collection Time: 09/06/22  9:23 AM   Specimen: Nasal Mucosa; Nasal Swab  Result Value Ref Range   MRSA by PCR Next Gen NOT DETECTED NOT DETECTED    No results found.  Assessment/Plan: 3 Days Post-Op   Principal Problem:   Septic arthritis of knee, left (HCC) Active Problems:   Multiple myeloma not having achieved remission (HCC)   Essential hypertension   S/P autologous bone marrow transplantation ([place])   Chronic heart failure with preserved ejection fraction (HFpEF) (HCC)   Hyponatremia   Hypothyroidism   COPD (chronic obstructive pulmonary disease) (HCC)   GERD (gastroesophageal reflux disease)   Leg swelling   Up with therapy Dressing changed OK for shower RTC in 12 to 14 days for suture removal   Lovell Sheehan , MD 09/06/2022, 4:44 PM

## 2022-09-06 NOTE — TOC Progression Note (Addendum)
Transition of Care [facility]) - Progression Note    Patient Details  Name: Brandi [name] MRN: [id number] Date of Birth: May 08, 1951  Transition of Care [facility]) CM/SW [place], LCSW Phone Number: 09/06/2022, 1:26 PM  Clinical Narrative:   Met with patient and her children at bedside. They confirmed preference for returning home with home health. Will start search. They do not have DME at home. Will check with PT to see what they might recommend.  2:50 pm: [facility] is reviewing referral for PT, OT, RN.  3:23 pm: [facility] has accepted referral. Start of care Thursday 2/15.  4:26 pm: Patient agreeable to wheelchair. Ordered through [employer].  Expected Discharge Plan: [place] Barriers to Discharge: Continued Medical Work up  Expected Discharge Plan and Services       Living arrangements for the past 2 months: Single Family Home                                       Social Determinants of Health (SDOH) Interventions SDOH Screenings   Food Insecurity: No Food Insecurity (09/04/2022)  Housing: Low Risk  (09/04/2022)  Transportation Needs: No Transportation Needs (09/04/2022)  Utilities: Not At Risk (09/04/2022)  Tobacco Use: Medium Risk (09/04/2022)    Readmission Risk Interventions    05/27/2021   10:42 AM  Readmission Risk Prevention Plan  Transportation Screening Complete  Medication Review ([place]) Complete  PCP or Specialist appointment within 3-5 days of discharge Complete  HRI or [place] Complete  SW Recovery Care/Counseling Consult Complete  Palliative Care Screening Complete  [place] Not Applicable

## 2022-09-07 ENCOUNTER — Inpatient Hospital Stay (HOSPITAL_COMMUNITY)
Admission: RE | Admit: 2022-09-07 | Discharge: 2022-09-07 | Disposition: A | Discharge status: Home / Self Care | Payer: 59 | Source: Ambulatory Visit | Attending: Infectious Diseases | Admitting: Infectious Diseases

## 2022-09-07 DIAGNOSIS — I38 Endocarditis, valve unspecified: Secondary | ICD-10-CM

## 2022-09-07 DIAGNOSIS — K219 Gastro-esophageal reflux disease without esophagitis: Secondary | ICD-10-CM | POA: Diagnosis not present

## 2022-09-07 DIAGNOSIS — J449 Chronic obstructive pulmonary disease, unspecified: Secondary | ICD-10-CM | POA: Diagnosis not present

## 2022-09-07 DIAGNOSIS — M009 Pyogenic arthritis, unspecified: Secondary | ICD-10-CM | POA: Diagnosis not present

## 2022-09-07 DIAGNOSIS — E038 Other specified hypothyroidism: Secondary | ICD-10-CM | POA: Diagnosis not present

## 2022-09-07 LAB — BODY FLUID CULTURE W GRAM STAIN

## 2022-09-07 LAB — ECHOCARDIOGRAM COMPLETE
AR max vel: 2.78 cm2
AV Area VTI: 2.66 cm2
AV Area mean vel: 2.19 cm2
AV Mean grad: 3 mmHg
AV Peak grad: 6.1 mmHg
Ao pk vel: 1.23 m/s
Area-P 1/2: 3.99 cm2
Calc EF: 51.3 %
Height: 62 in
MV VTI: 2.29 cm2
S' Lateral: 3 cm
Single Plane A2C EF: 51.4 %
Single Plane A4C EF: 46.4 %
Weight: 2737.23 oz

## 2022-09-07 MED ORDER — BISACODYL 10 MG RE SUPP
10.0000 mg | Freq: Every day | RECTAL | Status: DC | PRN
  Administered 2022-09-10: 10 mg via RECTAL
  Filled 2022-09-07: qty 1

## 2022-09-07 MED ORDER — SENNOSIDES-DOCUSATE SODIUM 8.6-50 MG PO TABS
2.0000 | ORAL_TABLET | Freq: Two times a day (BID) | ORAL | Status: AC
  Administered 2022-09-07 – 2022-09-08 (×3): 2 via ORAL
  Filled 2022-09-07 (×3): qty 2

## 2022-09-07 MED ORDER — POLYETHYLENE GLYCOL 3350 17 G PO PACK
17.0000 g | PACK | Freq: Two times a day (BID) | ORAL | Status: DC
  Administered 2022-09-07 – 2022-09-10 (×7): 17 g via ORAL
  Filled 2022-09-07 (×7): qty 1

## 2022-09-07 MED ORDER — OXYCODONE HCL 5 MG PO TABS
5.0000 mg | ORAL_TABLET | ORAL | Status: DC | PRN
  Administered 2022-09-07 – 2022-09-10 (×6): 5 mg via ORAL
  Filled 2022-09-07 (×8): qty 1

## 2022-09-07 MED ORDER — MORPHINE SULFATE (PF) 2 MG/ML IV SOLN
2.0000 mg | INTRAVENOUS | Status: DC | PRN
  Administered 2022-09-08 (×2): 2 mg via INTRAVENOUS
  Filled 2022-09-07 (×2): qty 1

## 2022-09-07 NOTE — Progress Notes (Signed)
*  PRELIMINARY RESULTS* Echocardiogram 2D Echocardiogram has been performed.  Brandi [name] 09/07/2022, 2:36 PM

## 2022-09-07 NOTE — Progress Notes (Signed)
  Subjective:  Patient reports pain as mild.    Objective:   VITALS:   Vitals:   09/06/22 0735 09/06/22 1616 09/06/22 2349 09/07/22 0822  BP: 100/64 134/60 114/71 130/78  Pulse: 70 73 74 64  Resp: '15 17 18 17  '$ Temp: 98.7 F (37.1 C) 99.2 F (37.3 C) 98.7 F (37.1 C) 98 F (36.7 C)  TempSrc: Oral   Oral  SpO2: 99% 100% 97% 99%  Weight:      Height:        PHYSICAL EXAM:  Sensation intact distally Dorsiflexion/Plantar flexion intact Incision: dressing C/D/I No cellulitis present Compartment soft  LABS  Results for orders placed or performed during the hospital encounter of 09/03/22 (from the past 24 hour(s))  Culture, blood (Routine X 2) w Reflex to ID Panel     Status: None (Preliminary result)   Collection Time: 09/06/22  2:00 PM   Specimen: BLOOD  Result Value Ref Range   Specimen Description BLOOD BLOOD RIGHT ARM RAC    Special Requests      BOTTLES DRAWN AEROBIC AND ANAEROBIC Blood Culture adequate volume   Culture      NO GROWTH < 12 HOURS Performed at [facility], [address] [place], [place] [address]    Report Status PENDING   Culture, blood (Routine X 2) w Reflex to ID Panel     Status: None (Preliminary result)   Collection Time: 09/06/22  2:10 PM   Specimen: BLOOD  Result Value Ref Range   Specimen Description BLOOD BLOOD RIGHT ARM RT THUMB    Special Requests      BOTTLES DRAWN AEROBIC ONLY Blood Culture results may not be optimal due to an inadequate volume of blood received in culture bottles   Culture      NO GROWTH < 12 HOURS Performed at [facility], [address], [place], [place] [address]    Report Status PENDING     DG Chest 2 View  Result Date: 09/06/2022 CLINICAL DATA:  Pneumococcus infection. EXAM: CHEST - 2 VIEW COMPARISON:  Chest radiograph Dec 07, 2021. FINDINGS: Accessed right chest Port-A-Cath with tip at the superior cavoatrial junction. The heart size and mediastinal contours are within normal  limits. Aortic atherosclerosis. Similar chronic peribronchial thickening. Low lung volumes with bibasilar atelectasis. No pleural effusion. No pneumothorax. No acute osseous abnormality. IMPRESSION: 1. Low lung volumes with bibasilar atelectasis. 2. Similar chronic peribronchial thickening. Electronically Signed   By: Dahlia Bailiff M.D.   On: 09/06/2022 17:01    Assessment/Plan: 4 Days Post-Op   Principal Problem:   Septic arthritis of knee, left (HCC) Active Problems:   Multiple myeloma not having achieved remission ([place])   Essential hypertension   S/P autologous bone marrow transplantation ([place])   Chronic heart failure with preserved ejection fraction (HFpEF) (HCC)   Hyponatremia   Hypothyroidism   COPD (chronic obstructive pulmonary disease) (HCC)   GERD (gastroesophageal reflux disease)   Leg swelling   Up with therapy RTC 7 to 10 days for suture removal OK for DC from orthopedic standpoint   Lovell Sheehan , MD 09/07/2022, 11:42 AM

## 2022-09-07 NOTE — Progress Notes (Signed)
Nutrition Follow-up  DOCUMENTATION CODES:   Not applicable  INTERVENTION:   -Continue Ensure Enlive po TID, each supplement provides 350 kcal and 20 grams of protein -Continue MVI with minerals daily -Case discussed with MD and LPN regarding possibility of increasing bowel regimen; last recorded BM on 09/03/22  NUTRITION DIAGNOSIS:   Increased nutrient needs related to catabolic illness (multiple myeloma, COPD, HF) as evidenced by estimated needs.  Ongoing  GOAL:   Patient will meet greater than or equal to 90% of their needs  Progressing   MONITOR:   PO intake, Supplement acceptance, Labs, Weight trends, I & O's, Skin  REASON FOR ASSESSMENT:   Consult Assessment of nutrition requirement/status  ASSESSMENT:   72 year old female with PMHx of multiple myeloma s/p autologous stem cell transplant 06/15/2018 with recurrent currently on salvage therapy, HTN, anxiety, HFpEF, hx thyroidectomy, COPD, GERD admitted from Oncology clinic with concern of 3 days of worsening left knee pain found to have septic arthritis of knee s/p left arthroscopic knee debridement on 09/03/22.  2/9- s/p PROCEDURE:  LEFT ARTHROSCOPIC KNEE DEBRIDEMENT   Reviewed I/O's: -1.2 L x 24 hours and -1.9 L since admission  UOP: 1.3 L x 24 hours  Pt sitting up in bed, being assisted with chair transfer by nurse and nursing student. Observed breakfast tray- pt consumed 75% of meal and a few sips of Ensure.    Reviewed wt hx; 5.1% wt loss over the past 2 months, which is not significant for time frame.   Noted last BM on 09/03/22 and colace was ordered at that time.   Per TOC notes, recommending SNF, but pt desires to return home with home health services.   Medications reviewed and include colace and aldactone.   Labs reviewed.   NUTRITION - FOCUSED PHYSICAL EXAM:  Flowsheet Row Most Recent Value  Orbital Region No depletion  Upper Arm Region No depletion  Thoracic and Lumbar Region No depletion  Buccal  Region No depletion  Temple Region No depletion  Clavicle Bone Region No depletion  Clavicle and Acromion Bone Region No depletion  Scapular Bone Region No depletion  Dorsal Hand No depletion  Patellar Region No depletion  Anterior Thigh Region No depletion  Posterior Calf Region No depletion  Edema (RD Assessment) None  Hair Reviewed  Eyes Reviewed  Mouth Reviewed  Skin Reviewed  Nails Reviewed       Diet Order:   Diet Order             Diet 2 gram sodium Room service appropriate? Yes; Fluid consistency: Thin  Diet effective now                   EDUCATION NEEDS:   No education needs have been identified at this time  Skin:  Skin Assessment: Skin Integrity Issues: Skin Integrity Issues:: Incisions Incisions: closed incision left leg  Last BM:  09/03/22  Height:   Ht Readings from Last 1 Encounters:  09/03/22 5' 2"$  (1.575 m)    Weight:   Wt Readings from Last 1 Encounters:  09/03/22 77.6 kg    Ideal Body Weight:  50 kg  BMI:  Body mass index is 31.29 kg/m.  Estimated Nutritional Needs:   Kcal:  [id number]  Protein:  100-115 grams  Fluid:  > 1.7 L    Loistine Chance, RD, LDN, [place] Registered Dietitian II Certified Diabetes Care and Education Specialist Please refer to [facility] for RD and/or RD on-call/weekend/after hours pager

## 2022-09-07 NOTE — Progress Notes (Signed)
PROGRESS NOTE    Brandi [name]   [id number] DOB: 1951/06/07  DOA: 09/03/2022 Date of Service: 09/07/22 PCP: Marguerita Merles, MD     Brief Narrative / Hospital Course:  Brandi [name] is a 72 year old female with history of multiple myeloma, who presents from oncology clinic for chief concerns of 3 days of worsening left knee pain. 02/09: to ED, admitted to hospitalist service, to OR for arthroscopic joint washout w/ Dr. Harlow Mares, continued on vancomycin and cefepime pending joint fluid cultures. DVT ruled out.  02/10: PT recs for SNF but pt states she is not interested in this at this time. Awaiting Cx from joint aspirate. Pain control.  02/11: Cx (+)strep pneumo, reincubated. Ortho recs for ID consult  02/12: ID consult - d/c vanc, change cefepime to ceftriaxone, obtain BCx, CXR, TTE 02/13: Per ortho, ok to d/c from their standpoint. Working w/ PT. Await BCx (NG <12h) and TTE and ID recs.    Consultants:  Orthopedics Infectious Disease   Procedures: 09/03/2022 joint aspiration in ED  09/03/2022 arthroscopic L knee debridement       ASSESSMENT & PLAN:   Principal Problem:   Septic arthritis of knee, left (HCC) Active Problems:   Multiple myeloma not having achieved remission (HCC)   Essential hypertension   S/P autologous bone marrow transplantation (HCC)   Chronic heart failure with preserved ejection fraction (HFpEF) (HCC)   Hyponatremia   Hypothyroidism   COPD (chronic obstructive pulmonary disease) (HCC)   GERD (gastroesophageal reflux disease)   Leg swelling  Septic arthritis of knee, left (HCC) DVT r/o Body fluid from left knee aspirate (+) strep pneumo   Had been on vancomycin and cefepime x4 days pending ID consult  today 09/07/22 is Day 5 total abx, vanc has been d/c, cefepime changed to ceftriaxone pending MIC/susceptibility per ID - appreciate recs  Myeloma complicates strep pneumo picture: CXR and BCx sent, TTE ordered  Pain control: Acetaminophen  650 mg p.o. every 6 hours as needed for mild pain, fever, headache; oxycodone previously at 10 mg q4h, then q6h, tapering down today to oxycodone 5 mg q4h, morphine for breakthrough but hopefully can come off this soon  OK to d/c per ortho, follow in clinic in 7-10 days for suture removal (02/20-02/23)  Constipation Bowel regimen increased today   Hypokalemia Replaced Monitor BMP  Hypothyroidism Levothyroxine 100 mcg daily resumed before breakfast  Essential hypertension Coreg 3.125 mg p.o. twice daily resumed Hydralazine 5 mg every 8 hours as needed for SBP greater than 180, 4 days ordered  Chronic heart failure with preserved ejection fraction (HFpEF) ([place]) Patient not in acute exacerbation Strict I's and O's  COPD (chronic obstructive pulmonary disease) ([place]) Patient is not in acute exacerbation DuoNebs every 6 hours as needed for shortness of breath and wheezing  Hyponatremia - resolved w /IV fluids  BMP in a.m.    DVT prophylaxis: lovenox  Pertinent IV fluids/nutrition: no continuous IV fluids, tolerating diet  Central lines / invasive devices: none  Code Status: FULL CODE  Current Admission Status: inpatient  TOC needs / Dispo plan: SNF vs HH - pt has declined SNF, TOC following, PT/OT to work w/ her while she is here  Barriers to discharge / significant pending items: pain control, ID recs, Echo and BCx pending              Subjective / Brief ROS:  Patient reports improved pain today Denies CP/SOB.   Denies new weakness.    Family Communication:  none at this time     Objective Findings:  Vitals:   09/06/22 0735 09/06/22 1616 09/06/22 2349 09/07/22 0822  BP: 100/64 134/60 114/71 130/78  Pulse: 70 73 74 64  Resp: 15 17 18 17  $ Temp: 98.7 F (37.1 C) 99.2 F (37.3 C) 98.7 F (37.1 C) 98 F (36.7 C)  TempSrc: Oral   Oral  SpO2: 99% 100% 97% 99%  Weight:      Height:        Intake/Output Summary (Last 24 hours) at 09/07/2022 1247 Last  data filed at 09/07/2022 [id number] Gross per 24 hour  Intake 340 ml  Output 800 ml  Net -460 ml   Filed Weights   09/03/22 1143 09/03/22 1557  Weight: 77.6 kg 77.6 kg    Examination:  Physical Exam Constitutional:      General: She is not in acute distress.    Appearance: Normal appearance. She is obese. She is not ill-appearing.  Cardiovascular:     Rate and Rhythm: Normal rate and regular rhythm.     Heart sounds: Murmur heard.  Pulmonary:     Effort: Pulmonary effort is normal. No respiratory distress.     Breath sounds: Normal breath sounds.  Abdominal:     General: Abdomen is flat. Bowel sounds are normal.     Palpations: Abdomen is soft.  Musculoskeletal:     Right lower leg: No edema.     Left lower leg: No edema.     Comments: LLE was in cold compressive device and is bandaged - these were not disturbed, no edema or erythema extending proximally. Pt was transferring from side of bed to lying down   Skin:    General: Skin is warm and dry.  Neurological:     General: No focal deficit present.     Mental Status: She is alert and oriented to person, place, and time. Mental status is at baseline.  Psychiatric:        Mood and Affect: Mood normal.        Behavior: Behavior normal.        Thought Content: Thought content normal.          Scheduled Medications:   buPROPion  75 mg Oral Daily   carvedilol  3.125 mg Oral BID WC   Chlorhexidine Gluconate Cloth  6 each Topical Daily   enoxaparin (LOVENOX) injection  40 mg Subcutaneous Q24H   feeding supplement  237 mL Oral TID BM   levothyroxine  100 mcg Oral Q0600   montelukast  10 mg Oral QHS   multivitamin with minerals  1 tablet Oral Daily   polyethylene glycol  17 g Oral BID   senna-docusate  2 tablet Oral BID   spironolactone  25 mg Oral Daily    Continuous Infusions:  cefTRIAXone (ROCEPHIN)  IV Stopped (09/07/22 0540)    PRN Medications:  acetaminophen **OR** acetaminophen, bisacodyl, hydrALAZINE,  ipratropium-albuterol, metoCLOPramide **OR** metoCLOPramide (REGLAN) injection, morphine injection, ondansetron **OR** ondansetron (ZOFRAN) IV, ondansetron **OR** ondansetron (ZOFRAN) IV, oxyCODONE  Antimicrobials from admission:  Anti-infectives (From admission, onward)    Start     Dose/Rate Route Frequency Ordered Stop   09/07/22 0600  cefTRIAXone (ROCEPHIN) 2 g in sodium chloride 0.9 % 100 mL IVPB        2 g 200 mL/hr over 30 Minutes Intravenous Every 24 hours 09/06/22 1352     09/05/22 1752  ceFEPIme (MAXIPIME) 2 g in sodium chloride 0.9 % 100 mL IVPB  2 g 200 mL/hr over 30 Minutes Intravenous Every 12 hours 09/05/22 0737 09/06/22 1758   09/04/22 1700  vancomycin (VANCOREADY) IVPB 1250 mg/250 mL  Status:  Discontinued        1,250 mg 166.7 mL/hr over 90 Minutes Intravenous Every 24 hours 09/03/22 1536 09/06/22 1332   09/04/22 1400  ceFEPIme (MAXIPIME) 2 g in sodium chloride 0.9 % 100 mL IVPB  Status:  Discontinued        2 g 200 mL/hr over 30 Minutes Intravenous Every 8 hours 09/04/22 1242 09/05/22 0738   09/04/22 0600  ceFEPIme (MAXIPIME) 2 g in sodium chloride 0.9 % 100 mL IVPB  Status:  Discontinued        2 g 200 mL/hr over 30 Minutes Intravenous Every 12 hours 09/03/22 1537 09/04/22 1242   09/03/22 1600  vancomycin (VANCOREADY) IVPB 1750 mg/350 mL  Status:  Discontinued        1,750 mg 175 mL/hr over 120 Minutes Intravenous  Once 09/03/22 1524 09/03/22 1852   09/03/22 1500  ceFEPIme (MAXIPIME) 2 g in sodium chloride 0.9 % 100 mL IVPB        2 g 200 mL/hr over 30 Minutes Intravenous  Once 09/03/22 1453 09/03/22 1651           Data Reviewed:  I have personally reviewed the following...  CBC: Recent Labs  Lab 09/03/22 0947 09/03/22 1147 09/04/22 0604 09/05/22 0341  WBC 10.7* 10.0 7.6 5.6  NEUTROABS 6.4 7.0  --   --   HGB 9.2* 9.5* 8.4* 8.5*  HCT 28.4* 30.2* 26.5* 27.0*  MCV 93.7 94.7 95.3 93.8  PLT 165 170 142* [id number]*   Basic Metabolic Panel: Recent  Labs  Lab 09/03/22 0947 09/03/22 1147 09/03/22 1910 09/04/22 0604 09/05/22 0341 09/06/22 0504  NA 131* 130*  --  135 135 135  K 3.2* 3.5  --  3.6 3.0* 3.9  CL 97* 96*  --  105 106 106  CO2 20* 22  --  21* 22 20*  GLUCOSE 108* 111* 97 105* 105* 99  BUN 9 9  --  8 10 9  $ CREATININE 0.83 0.90  --  0.84 0.93 0.76  CALCIUM 8.1* 8.3*  --  7.7* 8.2* 8.2*   GFR: Estimated Creatinine Clearance: 63.1 mL/min (by C-G formula based on SCr of 0.76 mg/dL). Liver Function Tests: Recent Labs  Lab 09/03/22 0947  AST 21  ALT 20  ALKPHOS 92  BILITOT 0.7  PROT 8.8*  ALBUMIN 3.5   No results for input(s): "LIPASE", "AMYLASE" in the last 168 hours. No results for input(s): "AMMONIA" in the last 168 hours. Coagulation Profile: No results for input(s): "INR", "PROTIME" in the last 168 hours. Cardiac Enzymes: No results for input(s): "CKTOTAL", "CKMB", "CKMBINDEX", "TROPONINI" in the last 168 hours. BNP (last 3 results) No results for input(s): "PROBNP" in the last 8760 hours. HbA1C: No results for input(s): "HGBA1C" in the last 72 hours. CBG: No results for input(s): "GLUCAP" in the last 168 hours. Lipid Profile: No results for input(s): "CHOL", "HDL", "LDLCALC", "TRIG", "CHOLHDL", "LDLDIRECT" in the last 72 hours. Thyroid Function Tests: No results for input(s): "TSH", "T4TOTAL", "FREET4", "T3FREE", "THYROIDAB" in the last 72 hours. Anemia Panel: No results for input(s): "VITAMINB12", "FOLATE", "FERRITIN", "TIBC", "IRON", "RETICCTPCT" in the last 72 hours. Most Recent Urinalysis On File:     Component Value Date/Time   COLORURINE STRAW (A) 05/25/2021 [place] (A) 05/25/2021 0049   LABSPEC 1.008 05/25/2021 0049  PHURINE 5.0 05/25/2021 0049   GLUCOSEU NEGATIVE 05/25/2021 0049   HGBUR MODERATE (A) 05/25/2021 0049   BILIRUBINUR NEGATIVE 05/25/2021 [place] 05/25/2021 0049   PROTEINUR 30 (A) 05/25/2021 0049   NITRITE NEGATIVE 05/25/2021 0049    LEUKOCYTESUR NEGATIVE 05/25/2021 0049   Sepsis Labs: @LABRCNTIP$ (procalcitonin:4,lacticidven:4) Microbiology: Recent Results (from the past 240 hour(s))  Body fluid culture w Gram Stain     Status: None   Collection Time: 09/03/22  1:05 PM   Specimen: KNEE; Body Fluid  Result Value Ref Range Status   Specimen Description   Final    KNEE Performed at [facility], [address] [place], [place] [address]    Special Requests   Final    Immunocompromised Performed at [facility], [address] [place], [place] [address]    Gram Stain   Final    RARE WBC PRESENT, PREDOMINANTLY PMN NO ORGANISMS SEEN    Culture   Final    RARE STREPTOCOCCUS PNEUMONIAE CRITICAL RESULT CALLED TO, READ BACK BY AND VERIFIED WITH: RN SHON.C AT 1145 ON 09/05/2022 BY T.SAAD. Performed at [facility], [place] [address] [place], [place] [address]    Report Status 09/07/2022 FINAL  Final   Organism ID, Bacteria STREPTOCOCCUS PNEUMONIAE  Final      Susceptibility   Streptococcus pneumoniae - MIC*    ERYTHROMYCIN <=0.12 SENSITIVE Sensitive     LEVOFLOXACIN 0.5 SENSITIVE Sensitive     VANCOMYCIN 0.5 SENSITIVE Sensitive     PENICILLIN (meningitis) <=0.06 SENSITIVE Sensitive     PENO - penicillin <=0.06      PENICILLIN (non-meningitis) <=0.06 SENSITIVE Sensitive     PENICILLIN (oral) <=0.06 SENSITIVE Sensitive     CEFTRIAXONE (non-meningitis) <=0.12 SENSITIVE Sensitive     CEFTRIAXONE (meningitis) <=0.12 SENSITIVE Sensitive     * RARE STREPTOCOCCUS PNEUMONIAE  Aerobic/Anaerobic Culture w Gram Stain (surgical/deep wound)     Status: None (Preliminary result)   Collection Time: 09/03/22  4:47 PM   Specimen: Wound  Result Value Ref Range Status   Specimen Description   Final    WOUND Performed at [facility], [address] [place], [place] [address]    Special Requests   Final    LEFT KNEE FLUID Performed at [facility], [address] [place],  [place] [address]    Gram Stain   Final    ABUNDANT WBC PRESENT, PREDOMINANTLY PMN NO ORGANISMS SEEN    Culture   Final    NO GROWTH < 24 HOURS Performed at [facility] [address], [place],  [address]    Report Status PENDING  Incomplete  MRSA Next Gen by PCR, Nasal     Status: None   Collection Time: 09/06/22  9:23 AM   Specimen: Nasal Mucosa; Nasal Swab  Result Value Ref Range Status   MRSA by PCR Next Gen NOT DETECTED NOT DETECTED Final    Comment: (NOTE) The GeneXpert MRSA Assay (FDA approved for NASAL specimens only), is one component of a comprehensive MRSA colonization surveillance program. It is not intended to diagnose MRSA infection nor to guide or monitor treatment for MRSA infections. Test performance is not FDA approved in patients less than 37 years old. Performed at [facility], [address] [place],  [address]   Culture, blood (Routine X 2) w Reflex to ID Panel     Status: None (Preliminary result)   Collection Time: 09/06/22  2:00 PM  Specimen: BLOOD  Result Value Ref Range Status   Specimen Description BLOOD BLOOD RIGHT ARM RAC  Final   Special Requests   Final    BOTTLES DRAWN AEROBIC AND ANAEROBIC Blood Culture adequate volume   Culture   Final    NO GROWTH < 12 HOURS Performed at [facility], [address] [place], [place] [address]    Report Status PENDING  Incomplete  Culture, blood (Routine X 2) w Reflex to ID Panel     Status: None (Preliminary result)   Collection Time: 09/06/22  2:10 PM   Specimen: BLOOD  Result Value Ref Range Status   Specimen Description BLOOD BLOOD RIGHT ARM RT THUMB  Final   Special Requests   Final    BOTTLES DRAWN AEROBIC ONLY Blood Culture results may not be optimal due to an inadequate volume of blood received in culture bottles   Culture   Final    NO GROWTH < 12 HOURS Performed at [facility], [address] [place], [place] [address]    Report Status PENDING   Incomplete      Radiology Studies last 3 days: DG Chest 2 View  Result Date: 09/06/2022 CLINICAL DATA:  Pneumococcus infection. EXAM: CHEST - 2 VIEW COMPARISON:  Chest radiograph Dec 07, 2021. FINDINGS: Accessed right chest Port-A-Cath with tip at the superior cavoatrial junction. The heart size and mediastinal contours are within normal limits. Aortic atherosclerosis. Similar chronic peribronchial thickening. Low lung volumes with bibasilar atelectasis. No pleural effusion. No pneumothorax. No acute osseous abnormality. IMPRESSION: 1. Low lung volumes with bibasilar atelectasis. 2. Similar chronic peribronchial thickening. Electronically Signed   By: Dahlia Bailiff M.D.   On: 09/06/2022 17:01   US Venous Img Lower Unilateral Left (DVT)  Result Date: 09/03/2022 CLINICAL DATA:  Pain and swelling EXAM: Left LOWER EXTREMITY VENOUS DOPPLER ULTRASOUND TECHNIQUE: Gray-scale sonography with compression, as well as color and duplex ultrasound, were performed to evaluate the deep venous system(s) from the level of the common femoral vein through the popliteal and proximal calf veins. COMPARISON:  None Available. FINDINGS: VENOUS Normal compressibility of the common femoral, superficial femoral, and popliteal veins, as well as the visualized calf veins. Visualized portions of profunda femoral vein and great saphenous vein unremarkable. No filling defects to suggest DVT on grayscale or color Doppler imaging. Doppler waveforms show normal direction of venous flow, normal respiratory plasticity and response to augmentation. Limited views of the contralateral common femoral vein are unremarkable. OTHER None. Limitations: none IMPRESSION: Negative. Electronically Signed   By: Donavan Foil M.D.   On: 09/03/2022 22:39             LOS: 4 days       Emeterio Reeve, DO [facility] 09/07/2022, 12:47 PM    Dictation software may have been used to generate the above note. Typos may occur and  escape review in typed/dictated notes. Please contact Dr Sheppard Coil directly for clarity if needed.  Staff may message me via secure chat in [place]  but this may not receive an immediate response,  please page me for urgent matters!  If 7PM-7AM, please contact night coverage [email or web address]

## 2022-09-07 NOTE — Plan of Care (Signed)
  Problem: Education: Goal: Knowledge of General Education information will improve Description: Including pain rating scale, medication(s)/side effects and non-pharmacologic comfort measures Outcome: Progressing   Problem: Health Behavior/Discharge Planning: Goal: Ability to manage health-related needs will improve Outcome: Progressing   Problem: Clinical Measurements: Goal: Ability to maintain clinical measurements within normal limits will improve Outcome: Progressing Goal: Will remain free from infection Outcome: Progressing Goal: Diagnostic test results will improve Outcome: Progressing Goal: Cardiovascular complication will be avoided Outcome: Progressing   Problem: Activity: Goal: Risk for activity intolerance will decrease Outcome: Progressing   Problem: Nutrition: Goal: Adequate nutrition will be maintained Outcome: Progressing   Problem: Coping: Goal: Level of anxiety will decrease Outcome: Progressing   Problem: Elimination: Goal: Will not experience complications related to bowel motility Outcome: Progressing Goal: Will not experience complications related to urinary retention Outcome: Progressing   Problem: Pain Managment: Goal: General experience of comfort will improve Outcome: Progressing   Problem: Safety: Goal: Ability to remain free from injury will improve Outcome: Progressing

## 2022-09-07 NOTE — TOC Progression Note (Addendum)
Transition of Care [facility]) - Progression Note    Patient Details  Name: Brandi [name] MRN: [id number] Date of Birth: April 08, 1951  Transition of Care [facility]) CM/SW [place], LCSW Phone Number: 09/07/2022, 9:46 AM  Clinical Narrative:  Ordered RW through [employer].   11:05 am: [employer] said patient would have to privately for RW. [employer] said there should be no insurance issue for them to provide RW so they will do so today.  Expected Discharge Plan: [place] Barriers to Discharge: Continued Medical Work up  Expected Discharge Plan and Services       Living arrangements for the past 2 months: Single Family Home                                       Social Determinants of Health (SDOH) Interventions SDOH Screenings   Food Insecurity: No Food Insecurity (09/04/2022)  Housing: Low Risk  (09/04/2022)  Transportation Needs: No Transportation Needs (09/04/2022)  Utilities: Not At Risk (09/04/2022)  Tobacco Use: Medium Risk (09/04/2022)    Readmission Risk Interventions    05/27/2021   10:42 AM  Readmission Risk Prevention Plan  Transportation Screening Complete  Medication Review ([place]) Complete  PCP or Specialist appointment within 3-5 days of discharge Complete  HRI or [place] Complete  SW Recovery Care/Counseling Consult Complete  Palliative Care Screening Complete  [place] Not Applicable

## 2022-09-08 DIAGNOSIS — M0010 Pneumococcal arthritis, unspecified joint: Secondary | ICD-10-CM | POA: Diagnosis present

## 2022-09-08 DIAGNOSIS — M00162 Pneumococcal arthritis, left knee: Secondary | ICD-10-CM | POA: Diagnosis not present

## 2022-09-08 MED ORDER — MORPHINE SULFATE (PF) 2 MG/ML IV SOLN
2.0000 mg | INTRAVENOUS | Status: DC | PRN
  Administered 2022-09-08 – 2022-09-10 (×9): 2 mg via INTRAVENOUS
  Filled 2022-09-08 (×10): qty 1

## 2022-09-08 NOTE — Plan of Care (Signed)

## 2022-09-08 NOTE — Plan of Care (Signed)
  Problem: Education: Goal: Knowledge of General Education information will improve Description: Including pain rating scale, medication(s)/side effects and non-pharmacologic comfort measures Outcome: Progressing   Problem: Health Behavior/Discharge Planning: Goal: Ability to manage health-related needs will improve Outcome: Progressing   Problem: Clinical Measurements: Goal: Ability to maintain clinical measurements within normal limits will improve Outcome: Progressing Goal: Diagnostic test results will improve Outcome: Progressing Goal: Respiratory complications will improve Outcome: Progressing Goal: Cardiovascular complication will be avoided Outcome: Progressing   Problem: Nutrition: Goal: Adequate nutrition will be maintained Outcome: Progressing   Problem: Coping: Goal: Level of anxiety will decrease Outcome: Progressing   Problem: Pain Managment: Goal: General experience of comfort will improve Outcome: Progressing

## 2022-09-08 NOTE — Progress Notes (Addendum)
Progress Note   Patient: Brandi [name] [id number] DOB: 1950-11-04 DOA: 09/03/2022     5 DOS: the patient was seen and examined on 09/08/2022    Brief Narrative / Hospital Course:  Brandi [name] is a 72 year old female with history of multiple myeloma, who presents from oncology clinic for chief concerns of 3 days of worsening left knee pain. 02/09: to ED, admitted to hospitalist service, to OR for arthroscopic joint washout w/ Dr. Harlow Mares, continued on vancomycin and cefepime pending joint fluid cultures. DVT ruled out.  02/10: PT recs for SNF but pt states she is not interested in this at this time. Awaiting Cx from joint aspirate. Pain control.  02/11: Cx (+)strep pneumo, reincubated. Ortho recs for ID consult  02/12: ID consult - d/c vanc, change cefepime to ceftriaxone, obtain BCx, CXR, TTE 02/13: Per ortho, ok to d/c from their standpoint. Working w/ PT. Await BCx (NG for 2 days, 2D echocardiogram shows no definite evidence of endocarditis, though native valvular disease is present. If clinical  concern persists, transesophageal echocardiogram should be considered.  02/14.  Continues to complain of pain in her left knee.  Discussed with ID physician who recommends 4 weeks of IV Rocephin 2 g daily.  End date 10/01/22   Consultants:  Orthopedics Infectious Disease    Procedures: 09/03/2022 joint aspiration in ED  09/03/2022 arthroscopic L knee debridement    Assessment and Plan:  Principal Problem:   Septic arthritis of knee, left ([place]) Active Problems:   Multiple myeloma not having achieved remission (HCC)   Essential hypertension   S/P autologous bone marrow transplantation ([place])   Chronic heart failure with preserved ejection fraction (HFpEF) (HCC)   Hyponatremia   Hypothyroidism   COPD (chronic obstructive pulmonary disease) (HCC)   GERD (gastroesophageal reflux disease)   Leg swelling   Septic arthritis of knee, left (HCC) DVT r/o Body fluid from left knee  aspirate (+) strep pneumo   Had been on vancomycin and cefepime x 5 days Started on Rocephin 2 g IV daily. Appreciate ID input and she recommends 4 weeks of IV Rocephin for treatment of patient's septic arthritis. Pain not adequately controled per patient.  Acetaminophen 650 mg p.o. every 6 hours as needed for mild pain, fever, headache; oxycodone previously at 10 mg q4h, then q6h, tapering down today to oxycodone 5 mg q4h, change morphine to 2 mg IV every 3 hours for breakthrough but hopefully can come off this soon  OK to d/c per ortho, follow in clinic in 7-10 days for suture removal (02/20-02/23)   Constipation Bowel regimen increased today    Hypokalemia Replaced Monitor BMP   Hypothyroidism Levothyroxine 100 mcg daily resumed before breakfast   Essential hypertension Coreg 3.125 mg p.o. twice daily resumed Blood pressure is stable   Chronic heart failure with preserved ejection fraction (HFpEF) ([place]) Patient not in acute exacerbation Strict I's and O's   COPD (chronic obstructive pulmonary disease) ([place]) Patient is not in acute exacerbation DuoNebs every 6 hours as needed for shortness of breath and wheezing   Hyponatremia - resolved w /IV fluids  BMP in a.m.        Physical Exam: Vitals:   09/07/22 0822 09/07/22 1641 09/07/22 2347 09/08/22 0729  BP: 130/78 122/67 119/64 130/69  Pulse: 64 68 72 70  Resp: 17 17 15 19  $ Temp: 98 F (36.7 C) 98.7 F (37.1 C) 97.9 F (36.6 C) 98.7 F (37.1 C)  TempSrc: Oral Oral    SpO2: 99%  97% 98%  Weight:      Height:       Physical Exam Vitals and nursing note reviewed.  Constitutional:      Appearance: Normal appearance.  HENT:     Head: Normocephalic.     Nose: Nose normal.     Mouth/Throat:     Mouth: Mucous membranes are moist.  Eyes:     Conjunctiva/sclera: Conjunctivae normal.  Cardiovascular:     Rate and Rhythm: Normal rate and regular rhythm.  Pulmonary:     Effort: Pulmonary effort is normal.      Breath sounds: Normal breath sounds.  Abdominal:     General: Abdomen is flat.     Palpations: Abdomen is soft.  Musculoskeletal:     Cervical back: Normal range of motion and neck supple.     Comments: Decreased range of motion left knee  Skin:    General: Skin is warm and dry.  Neurological:     Mental Status: She is alert and oriented to person, place, and time.  Psychiatric:        Mood and Affect: Mood normal.        Behavior: Behavior normal.     Data Reviewed:  There are no new results to review at this time.  Family Communication: Discussed plan of care with patient and her significant other at the bedside.  Disposition: Status is: Inpatient Remains inpatient appropriate because: Continues to require IV antibiotics for septic arthritis  Planned Discharge Destination: Home with Home Health    Time spent: 35 minutes minutes  Author: Collier Bullock, MD 09/08/2022 3:00 PM  For on call review [email or web address].

## 2022-09-08 NOTE — Progress Notes (Signed)
PHARMACY CONSULT NOTE FOR:  OUTPATIENT  PARENTERAL ANTIBIOTIC THERAPY (OPAT)  Indication: S. Pneumoniae septic arthritis of knee Regimen: Ceftriaxone 2gm IV q24h End date: 10/01/2022  Weekly labs: CBC/diff, CMP Fax weekly lab results promptly to [phone]   IV antibiotic discharge orders are pended. To discharging provider:  please sign these orders via discharge navigator,  Select New Orders & click on the button choice - Manage This Unsigned Work.     Thank you for allowing pharmacy to be a part of this patient's care.  Doreene Eland, PharmD, BCPS, BCIDP Work Cell: [phone] 09/08/2022 1:12 PM

## 2022-09-08 NOTE — Treatment Plan (Signed)
Diagnosis: Septic arthritis of the Left knee due to Pneumococcus Baseline Creatinine <1    No Known Allergies  OPAT Orders Discharge antibiotics: Cefreiaxone 2 grams IV every 24 hours  Duration: 4 weeks End Date: 10/01/22  Infuse thru PORT-  Port care  Labs weekly while on IV antibiotics: _X_ CBC with differential __ BMP_X_ CMP   Fax weekly lab results  promptly to [phone]  Clinic Follow Up Appt:09/23/22 at 10.30Am with Dr.Danon Lograsso   Call [phone] with any questions or concerns

## 2022-09-08 NOTE — TOC Progression Note (Signed)
Transition of Care [facility]) - Progression Note    Patient Details  Name: Brandi [name] MRN: [id number] Date of Birth: [birth year]  Transition of Care [facility]) CM/SW [place], LCSW Phone Number: 09/08/2022, 12:53 PM  Clinical Narrative:  [facility] can no longer accept home health referral due to staffing. Checking with other agencies.   Expected Discharge Plan: [place] Barriers to Discharge: Continued Medical Work up  Expected Discharge Plan and Services       Living arrangements for the past 2 months: Single Family Home                                       Social Determinants of Health (SDOH) Interventions SDOH Screenings   Food Insecurity: No Food Insecurity (09/04/2022)  Housing: Low Risk  (09/04/2022)  Transportation Needs: No Transportation Needs (09/04/2022)  Utilities: Not At Risk (09/04/2022)  Tobacco Use: Medium Risk (09/04/2022)    Readmission Risk Interventions    05/27/2021   10:42 AM  Readmission Risk Prevention Plan  Transportation Screening Complete  Medication Review ([place]) Complete  PCP or Specialist appointment within 3-5 days of discharge Complete  HRI or [place] Complete  SW Recovery Care/Counseling Consult Complete  Palliative Care Screening Complete  [place] Not Applicable

## 2022-09-08 NOTE — Progress Notes (Signed)
Physical Therapy Treatment Patient Details Name: Brandi [name] MRN: [id number] DOB: [birth year] Today's Date: 09/08/2022   History of Present Illness Pt is a 72 year old female with PMH that includes COPD, CHF, HTN, and multiple myeloma who presents from oncology clinic for chief concerns of 3 days of worsening left knee pain. MD assessment includes hyponatremia and septic left knee joint with pt now s/p left arthroscopic knee debridement.    PT Comments    Pt required encouragement and education on physiological benefits of activity and ended up putting in good effort during the session. Pt required significant assist to manage her LLE during bed mobility tasks along with extra time and very slow, incremental movements to tolerate the activity.  Pt was able to stand this session and with extensive cuing for step-to gait pattern was able to take a few small, shuffling steps at the EOB.  Pt will benefit from PT services in a SNF setting upon discharge to safely address deficits listed in patient problem list for decreased caregiver assistance and eventual return to PLOF.    Recommendations for follow up therapy are one component of a multi-disciplinary discharge planning process, led by the attending physician.  Recommendations may be updated based on patient status, additional functional criteria and insurance authorization.  Follow Up Recommendations  Skilled nursing-short term rehab (<3 hours/day) Can patient physically be transported by private vehicle: No   Assistance Recommended at Discharge    Patient can return home with the following Two people to help with walking and/or transfers;A lot of help with bathing/dressing/bathroom;Assistance with cooking/housework;Assist for transportation;Help with stairs or ramp for entrance   Equipment Recommendations  Wheelchair (measurements PT);Rolling walker (2 wheels)    Recommendations for Other Services       Precautions / Restrictions  Precautions Precautions: Fall Restrictions Weight Bearing Restrictions: Yes LLE Weight Bearing: Weight bearing as tolerated     Mobility  Bed Mobility Overal bed mobility: Needs Assistance Bed Mobility: Supine to Sit, Sit to Supine     Supine to sit: Mod assist Sit to supine: Mod assist, +2 for physical assistance   General bed mobility comments: Mod A to manage the LLE during sup to sit and +2 mod A for BLE and trunk control during sit to sup    Transfers Overall transfer level: Needs assistance Equipment used: Rolling walker (2 wheels) Transfers: Sit to/from Stand Sit to Stand: Mod assist, +2 physical assistance           General transfer comment: Mod verbal cues for BUE and L foot positioning and to encourage use of LLE    Ambulation/Gait Ambulation/Gait assistance: Min assist Gait Distance (Feet): 1 Feet Assistive device: Rolling walker (2 wheels) Gait Pattern/deviations: Step-to pattern, Decreased stance time - left, Decreased step length - right, Shuffle, Trunk flexed Gait velocity: decreased     General Gait Details: Pt only able to take 2-3 very small, shuffling steps at the EOB with min A to guide the RW and max verbal cues for step-to sequencing for pain control   Stairs             Wheelchair Mobility    Modified Rankin (Stroke Patients Only)       Balance Overall balance assessment: Needs assistance   Sitting balance-Leahy Scale: Good     Standing balance support: Bilateral upper extremity supported, During functional activity, Reliant on assistive device for balance Standing balance-Leahy Scale: Fair  Cognition Arousal/Alertness: Awake/alert Behavior During Therapy: WFL for tasks assessed/performed Overall Cognitive Status: Within Functional Limits for tasks assessed                                          Exercises Total Joint Exercises Ankle Circles/Pumps: AROM,  Strengthening, Both, 10 reps (manual resistance on the RLE) Quad Sets: Strengthening, Both, 10 reps Gluteal Sets: Strengthening, Both, 10 reps Hip ABduction/ADduction: AAROM, Strengthening, Both, 5 reps Straight Leg Raises: AAROM, Strengthening, Both, 5 reps Long Arc Quad: AROM, Strengthening, Both, 10 reps Knee Flexion: AROM, Strengthening, Both, 10 reps Other Exercises Other Exercises: Pt education provided on physiological benefits of activity and time OOB Other Exercises: Pt and family education on LLE positioning to encourage L knee ext PROM    General Comments        Pertinent Vitals/Pain Pain Assessment Pain Assessment: 0-10 Pain Score: 7  Pain Location: L knee Pain Descriptors / Indicators: Aching, Sore Pain Intervention(s): Repositioned, Premedicated before session, Monitored during session    Home Living                          Prior Function            PT Goals (current goals can now be found in the care plan section) Progress towards PT goals: Progressing toward goals    Frequency    Min 2X/week      PT Plan Current plan remains appropriate    Co-evaluation              AM-PAC PT "6 Clicks" Mobility   Outcome Measure  Help needed turning from your back to your side while in a flat bed without using bedrails?: A Lot Help needed moving from lying on your back to sitting on the side of a flat bed without using bedrails?: A Lot Help needed moving to and from a bed to a chair (including a wheelchair)?: A Lot Help needed standing up from a chair using your arms (e.g., wheelchair or bedside chair)?: A Lot Help needed to walk in hospital room?: Total Help needed climbing 3-5 steps with a railing? : Total 6 Click Score: 10    End of Session Equipment Utilized During Treatment: Gait belt Activity Tolerance: Patient limited by pain Patient left: in bed;with call bell/phone within reach;with bed alarm set;with SCD's reapplied;with  family/visitor present Nurse Communication: Mobility status PT Visit Diagnosis: Other abnormalities of gait and mobility (R26.89);Muscle weakness (generalized) (M62.81);Pain Pain - Right/Left: Left Pain - part of body: Knee     Time: [id number] PT Time Calculation (min) (ACUTE ONLY): 33 min  Charges:  $Therapeutic Exercise: 8-22 mins $Therapeutic Activity: 8-22 mins                    D. Scott Sireen Halk PT, DPT 09/08/22, 2:50 PM

## 2022-09-08 NOTE — Progress Notes (Signed)
ID Left knee septic arthritis due to Strep Pneumo ( pneumococcus) Multiple myeloma Chemo [facility] Discussed with patient on the phone regarding need for IV antibiotic on discharge for total of 4 weeks OPAT note placed Will follow her as OP

## 2022-09-09 DIAGNOSIS — M00162 Pneumococcal arthritis, left knee: Secondary | ICD-10-CM | POA: Diagnosis not present

## 2022-09-09 NOTE — Plan of Care (Signed)

## 2022-09-09 NOTE — TOC Progression Note (Addendum)
Transition of Care [facility]) - Progression Note    Patient Details  Name: DORISE EGELER MRN: [id number] Date of Birth: Mar 17, 1951  Transition of Care [facility]) CM/SW [place], LCSW Phone Number: 09/09/2022, 9:17 AM  Clinical Narrative:  [facility], and [facility] are unable to accept home health referral. [facility] can accept but cannot start until Sunday or Monday. Left messages with [facility] liaisons.  9:42 am: [facility] are unable to accept. [facility] liaison patient's referral information. [place] liaison is aware.  10:55 am: Updated patient and family member at bedside. [place] liaison has arranged for a nurse through [employer] until [place] can start services.  Expected Discharge Plan: [place] Barriers to Discharge: Continued Medical Work up  Expected Discharge Plan and Services       Living arrangements for the past 2 months: Single Family Home                                       Social Determinants of Health (SDOH) Interventions SDOH Screenings   Food Insecurity: No Food Insecurity (09/04/2022)  Housing: Low Risk  (09/04/2022)  Transportation Needs: No Transportation Needs (09/04/2022)  Utilities: Not At Risk (09/04/2022)  Tobacco Use: Medium Risk (09/04/2022)    Readmission Risk Interventions    05/27/2021   10:42 AM  Readmission Risk Prevention Plan  Transportation Screening Complete  Medication Review ([place]) Complete  PCP or Specialist appointment within 3-5 days of discharge Complete  HRI or [place] Complete  SW Recovery Care/Counseling Consult Complete  Palliative Care Screening Complete  [place] Not Applicable

## 2022-09-09 NOTE — Progress Notes (Signed)
. Progress Note   Patient: Brandi [name] [id number] DOB: 1951-03-25 DOA: 09/03/2022     6 DOS: the patient was seen and examined on 09/09/2022    Brief Narrative / Hospital Course:  Brandi [name] is a 72 year old female with history of multiple myeloma, who presents from oncology clinic for chief concerns of 3 days of worsening left knee pain. 02/09: to ED, admitted to hospitalist service, to OR for arthroscopic joint washout w/ Dr. Harlow Mares, continued on vancomycin and cefepime pending joint fluid cultures. DVT ruled out.  02/10: PT recs for SNF but pt states she is not interested in this at this time. Awaiting Cx from joint aspirate. Pain control.  02/11: Cx (+)strep pneumo, reincubated. Ortho recs for ID consult  02/12: ID consult - d/c vanc, change cefepime to ceftriaxone, obtain BCx, CXR, TTE 02/13: Per ortho, ok to d/c from their standpoint. Working w/ PT. Await BCx (NG for 2 days, 2D echocardiogram shows no definite evidence of endocarditis, though native valvular disease is present. If clinical  concern persists, transesophageal echocardiogram should be considered.  02/14.  Continues to complain of pain in her left knee.  Discussed with ID physician who recommends 4 weeks of IV Rocephin 2 g daily.  End date 10/01/22 2/15 : Patient seen and examined at bedside.  Sitting in the chair.  Son updated about the progress.  No overnight events.   Consultants:  Orthopedics Infectious Disease    Procedures: 09/03/2022 joint aspiration in ED  09/03/2022 arthroscopic L knee debridement   Assessment and Plan:  Principal Problem:   Septic arthritis of knee, left ([place]) Active Problems:   Multiple myeloma not having achieved remission (HCC)   Essential hypertension   S/P autologous bone marrow transplantation ([place])   Chronic heart failure with preserved ejection fraction (HFpEF) (HCC)   Hyponatremia   Hypothyroidism   COPD (chronic obstructive pulmonary disease) (HCC)   GERD  (gastroesophageal reflux disease)   Leg swelling   Septic arthritis of knee, left  DVT r/o Body fluid from left knee aspirate (+) strep pneumo   Had been on vancomycin and cefepime x 5 days Started on Rocephin 2 g IV daily. Appreciate ID input and she recommends 4 weeks of IV Rocephin for treatment of patient's septic arthritis. Pain not adequately controled per patient.  Acetaminophen 650 mg p.o. every 6 hours as needed for mild pain, fever, headache; oxycodone previously at 10 mg q4h, then q6h, tapering down today to oxycodone 5 mg q4h, change morphine to 2 mg IV every 3 hours for breakthrough but hopefully can come off this soon  OK to d/c per ortho, follow in clinic in 7-10 days for suture removal (02/20-02/23)   Constipation Bowel regimen increased today    Hypokalemia Replaced Monitor BMP   Hypothyroidism Levothyroxine 100 mcg daily resumed before breakfast   Essential hypertension Coreg 3.125 mg p.o. twice daily resumed Blood pressure is stable   Chronic heart failure with preserved ejection fraction (HFpEF) ([place]) Patient not in acute exacerbation Strict I's and O's   COPD (chronic obstructive pulmonary disease) ([place]) Patient is not in acute exacerbation DuoNebs every 6 hours as needed for shortness of breath and wheezing   Hyponatremia - resolved w /IV fluids  BMP in a.m.        Physical Exam: Vitals:   09/08/22 0729 09/08/22 1533 09/08/22 2320 09/09/22 0823  BP: 130/69 121/74 120/62 113/64  Pulse: 70 73 74 69  Resp: 19 15 15 16  $ Temp: 98.7 F (  37.1 C) 98.5 F (36.9 C) 98.6 F (37 C) 98.9 F (37.2 C)  TempSrc:      SpO2: 98% 100% 96% 98%  Weight:      Height:       Physical Exam Vitals and nursing note reviewed.  Constitutional:      Appearance: Normal appearance.  HENT:     Head: Normocephalic.     Nose: Nose normal.     Mouth/Throat:     Mouth: Mucous membranes are moist.  Eyes:     Conjunctiva/sclera: Conjunctivae normal.   Cardiovascular:     Rate and Rhythm: Normal rate and regular rhythm.  Pulmonary:     Effort: Pulmonary effort is normal.     Breath sounds: Normal breath sounds.  Abdominal:     General: Abdomen is flat.     Palpations: Abdomen is soft.  Musculoskeletal:     Cervical back: Normal range of motion and neck supple.     Comments: Decreased range of motion left knee  Skin:    General: Skin is warm and dry.  Neurological:     Mental Status: She is alert and oriented to person, place, and time.  Psychiatric:        Mood and Affect: Mood normal.        Behavior: Behavior normal.     Data Reviewed:  There are no new results to review at this time.  Family Communication: Discussed plan of care with patient and her significant other at the bedside.  Disposition: Status is: Inpatient Remains inpatient appropriate because: Continues to require IV antibiotics for septic arthritis  Planned Discharge Destination: Home with Home Health    Time spent: 35 minutes minutes  Author: Oran Rein, MD 09/09/2022 2:47 PM  For on call review [email or web address].

## 2022-09-09 NOTE — Care Management Important Message (Signed)
Important Message  Patient Details  Name: Brandi [name] MRN: [id number] Date of Birth: 06/01/1951   Medicare Important Message Given:  Yes     Juliann Pulse A Jewelz Kobus 09/09/2022, 11:18 AM

## 2022-09-09 NOTE — Care Management Important Message (Signed)
Important Message  Patient Details  Name: Brandi [name] MRN: [id number] Date of Birth: 1951/01/30   Medicare Important Message Given:  Yes     Juliann Pulse A Helon Wisinski 09/09/2022, 11:05 AM

## 2022-09-09 NOTE — Progress Notes (Signed)
Physical Therapy Treatment Patient Details Name: Brandi [name] MRN: [id number] DOB: March 16, 1951 Today's Date: 09/09/2022   History of Present Illness Pt is a 72 year old female with PMH that includes COPD, CHF, HTN, and multiple myeloma who presents from oncology clinic for chief concerns of 3 days of worsening left knee pain. MD assessment includes hyponatremia and septic left knee joint with pt now s/p left arthroscopic knee debridement.    PT Comments    Pt continues to be extremely pain limited/hesitant with any and all L LE activity.  She showed decent effort but needed constant encouragement and regular rest breaks due to pain and general hesitancy.  She tolerated only 30 - 35* of flexion with L heel slides before yelling out in pain - unable to lift LLE against gravity.  She did manage to actually take a few real steps for the first time since surgery but still needed excessive cuing and encouragement to do a highly effortful, guarded few feet with very limited tolerance for much L LE WBing.  Pt's son present and encouraging throughout the session to motivate getting home, however despite taking a few steps today her mobility thus far she has not shown the ability to safely manage at home, PT continues to recommend STR at d/c.  Recommendations for follow up therapy are one component of a multi-disciplinary discharge planning process, led by the attending physician.  Recommendations may be updated based on patient status, additional functional criteria and insurance authorization.  Follow Up Recommendations  Skilled nursing-short term rehab (<3 hours/day) Can patient physically be transported by private vehicle: No   Assistance Recommended at Discharge Frequent or constant Supervision/Assistance  Patient can return home with the following Two people to help with walking and/or transfers;A lot of help with bathing/dressing/bathroom;Assistance with cooking/housework;Assist for  transportation;Help with stairs or ramp for entrance   Equipment Recommendations  Wheelchair (measurements PT);Rolling walker (2 wheels)    Recommendations for Other Services       Precautions / Restrictions Precautions Precautions: Fall Restrictions LLE Weight Bearing: Weight bearing as tolerated     Mobility  Bed Mobility Overal bed mobility: Needs Assistance Bed Mobility: Supine to Sit     Supine to sit: Min assist     General bed mobility comments: Pt better able to tolerate transition to sitting EOB.  Still very slow and guarded, light assist to help manage L LE    Transfers Overall transfer level: Needs assistance Equipment used: Rolling walker (2 wheels) Transfers: Sit to/from Stand Sit to Stand: Min assist, Mod assist           General transfer comment: heavy cuing and reinforcement, but with extra time and encouragement she did manage to attain standing from elevated bed with only min assist but pt very hesitant to really take much weight through the LLE.    Ambulation/Gait Ambulation/Gait assistance: Min assist, Mod assist Gait Distance (Feet): 4 Feet Assistive device: Rolling walker (2 wheels)         General Gait Details: Pt able to show improved effort and distance with ambulation today but ultimately remains very guarded, slow, hesitant and needing essentially constant cuing to keep trying as she clearly was not comfortable with the effort.  Pt able to occasionally WBing on the L enough to clear R foot for a few (exceedingly small) steps but mostly simply shuffling/sliding R LE with heavy UE reliance on walker.   Stairs             Wheelchair  Mobility    Modified Rankin (Stroke Patients Only)       Balance Overall balance assessment: Needs assistance Sitting-balance support: No upper extremity supported Sitting balance-Leahy Scale: Good     Standing balance support: Bilateral upper extremity supported, During functional activity,  Reliant on assistive device for balance Standing balance-Leahy Scale: Fair Standing balance comment: exceedingly reliant on UEs, especially when needing to do any WBing on the R.                            Cognition Arousal/Alertness: Awake/alert Behavior During Therapy: WFL for tasks assessed/performed Overall Cognitive Status: Within Functional Limits for tasks assessed                                          Exercises General Exercises - Lower Extremity Ankle Circles/Pumps: AROM, 10 reps Quad Sets: Strengthening, 10 reps Short Arc Quad:  (attempted, pt does not tolerate the initial positioning) Heel Slides: AAROM, 5 reps (pt calling out in pain at ~30* flexion, struggled with much AROM for this) Hip ABduction/ADduction: AROM, Strengthening, 10 reps Straight Leg Raises: AAROM, 5 reps, PROM (very little against gravity assist)    General Comments        Pertinent Vitals/Pain Pain Assessment Pain Assessment: 0-10 Pain Score: 9  Pain Location: L knee    Home Living                          Prior Function            PT Goals (current goals can now be found in the care plan section) Progress towards PT goals: Progressing toward goals    Frequency    Min 2X/week      PT Plan Current plan remains appropriate    Co-evaluation              AM-PAC PT "6 Clicks" Mobility   Outcome Measure  Help needed turning from your back to your side while in a flat bed without using bedrails?: A Little Help needed moving from lying on your back to sitting on the side of a flat bed without using bedrails?: A Little Help needed moving to and from a bed to a chair (including a wheelchair)?: A Lot Help needed standing up from a chair using your arms (e.g., wheelchair or bedside chair)?: A Lot Help needed to walk in hospital room?: Total Help needed climbing 3-5 steps with a railing? : Total 6 Click Score: 12    End of Session  Equipment Utilized During Treatment: Gait belt Activity Tolerance: Patient limited by pain Patient left: in bed;with call bell/phone within reach;with bed alarm set;with SCD's reapplied;with family/visitor present Nurse Communication: Mobility status PT Visit Diagnosis: Other abnormalities of gait and mobility (R26.89);Muscle weakness (generalized) (M62.81);Pain Pain - Right/Left: Left Pain - part of body: Knee     Time: [id number] PT Time Calculation (min) (ACUTE ONLY): 44 min  Charges:  $Gait Training: 8-22 mins $Therapeutic Exercise: 8-22 mins $Therapeutic Activity: 8-22 mins                     Kreg Shropshire, DPT 09/09/2022, 12:50 PM

## 2022-09-09 NOTE — Consult Note (Signed)
NAME: Brandi [name]  DOB: 05/23/1951  MRN: [id number]  Date/Time: 09/09/2022 1:45 PM  REQUESTING PROVIDER: Dr.Alexander Subjective:  REASON FOR CONSULT: strep pneumo septic arthritis   ? Brandi [name] is a 72 y.o. with a history of recurrent multiple myeloma s/p Stem cell transplant 2019, currently on salvage Rx with daratumumab/lenalidomide/dexa followed by [facility] onc,  hypothyroidism secondary to total thyroidectomy for Graves in 2022, strep pneumo bacteremia and pneumonia is March 2022 Presented to the ED from onc clinic on 2/9 with left knee pain and swelling of 3 days duration. Pt denied any trauma Had some fever No intraatricular steroid injection  Vitals in the ED  09/03/22 10:21  BP 132/69  Temp 101 F (38.3 C) !  Pulse Rate 85  Resp 16  SpO2 100 %     Latest Reference Range & Units 09/03/22 11:47  WBC 4.0 - 10.5 K/uL 10.0  Hemoglobin 12.0 - 15.0 g/dL 9.5 (L)  HCT 36.0 - 46.0 % 30.2 (L)  Platelets 150 - 400 K/uL 170  Creatinine 0.44 - 1.00 mg/dL 0.90   EDPaspirated the left knee and analysis was  Latest Reference Range & Units 09/03/22 13:05  Color, Synovial YELLOW  YELLOW !  Appearance-Synovial CLEAR  CLOUDY !  Crystals, Fluid  NO CRYSTALS SEEN  WBC, Synovial 0 - 200 /cu mm 72,482 (H)  Neutrophil, Synovial % 96   Fluid culture positive for strep pneumo Underwent arthroscopic knee debridement on 09/03/22 Pt is currently on ceftriaxone I am asked to see the patient for septic arthritis Past Medical History:  Diagnosis Date   (HFpEF) heart failure with preserved ejection fraction ([place])    a. 06/2020 Echo: EF 60-65%, no rwma, nl RV size/fxn. Triv MR. Triv TR/AI. Mod elev PASP.   Anxiety    COPD (chronic obstructive pulmonary disease) (HCC)    GERD (gastroesophageal reflux disease)    Hypertension    Hyperthyroidism    Hypokalemia    IgA myeloma (HCC)    Multiple myeloma (HCC)    Pneumonia    Red blood cell antibody positive, compatible PRBC difficult to  obtain    S/P autologous bone marrow transplantation [facility])     Past Surgical History:  Procedure Laterality Date   ANTERIOR VITRECTOMY Left 10/07/2021   Procedure: ANTERIOR VITRECTOMY;  Surgeon: Leandrew Koyanagi, MD;  Location: [place];  Service: Ophthalmology;  Laterality: Left;   CATARACT EXTRACTION W/PHACO Left 10/07/2021   Procedure: CATARACT EXTRACTION PHACO AND INTRAOCULAR LENS PLACEMENT ([place]) LEFT VISION BLUE;  Surgeon: Leandrew Koyanagi, MD;  Location: [place];  Service: Ophthalmology;  Laterality: Left;  kit for manual incision 14.56 01:21.4   CATARACT EXTRACTION W/PHACO Right 10/21/2021   Procedure: CATARACT EXTRACTION PHACO AND INTRAOCULAR LENS PLACEMENT ([facility]) RIGHT 11.12 01:48.1;  Surgeon: Leandrew Koyanagi, MD;  Location: [place];  Service: Ophthalmology;  Laterality: Right;   ESOPHAGOGASTRODUODENOSCOPY (EGD) WITH PROPOFOL N/A 03/30/2019   Procedure: ESOPHAGOGASTRODUODENOSCOPY (EGD) WITH PROPOFOL;  Surgeon: Jonathon Bellows, MD;  Location: [facility] ENDOSCOPY;  Service: Gastroenterology;  Laterality: N/A;   IR FLUORO GUIDE PORT INSERTION RIGHT  12/16/2017   IR IMAGING GUIDED PORT INSERTION  06/15/2021   KNEE ARTHROSCOPY Left 09/03/2022   Procedure: ARTHROSCOPY KNEE DEBRIDEMENT;  Surgeon: Lovell Sheehan, MD;  Location: [facility];  Service: Orthopedics;  Laterality: Left;   THYROIDECTOMY N/A     Social History   Socioeconomic History   Marital status: Single    Spouse name: Not on file   Number of children:  3   Years of education: Not on file   Highest education level: Not on file  Occupational History   Not on file  Tobacco Use   Smoking status: Former    Packs/day: 0.25    Types: Cigarettes    Quit date: 03/18/2021    Years since quitting: 1.4   Smokeless tobacco: Never   Tobacco comments:    2 cigarettes QOD  Vaping Use   Vaping Use: Never used  Substance and Sexual Activity   Alcohol use: No   Drug use: No   Sexual activity:  Not Currently  Other Topics Concern   Not on file  Social History Narrative   Lives at home with family(son): daughter 8 blocks away. States "I'm never alone." Independent at baseline   Social Determinants of Health   Financial Resource Strain: Not on file  Food Insecurity: No Food Insecurity (09/04/2022)   Hunger Vital Sign    Worried About Running Out of Food in the Last Year: Never true    Ran Out of Food in the Last Year: Never true  Transportation Needs: No Transportation Needs (09/04/2022)   PRAPARE - Hydrologist (Medical): No    Lack of Transportation (Non-Medical): No  Physical Activity: Not on file  Stress: Not on file  Social Connections: Not on file  Intimate Partner Violence: Not At Risk (09/04/2022)   Humiliation, Afraid, Rape, and Kick questionnaire    Fear of Current or Ex-Partner: No    Emotionally Abused: No    Physically Abused: No    Sexually Abused: No    Family History  Problem Relation Age of Onset   Pneumonia Mother    Seizures Father    No Known Allergies I? Current Facility-Administered Medications  Medication Dose Route Frequency Provider Last Rate Last Admin   bisacodyl (DULCOLAX) suppository 10 mg  10 mg Rectal Daily PRN Emeterio Reeve, DO       buPROPion [facility]) tablet 75 mg  75 mg Oral Daily Cox, Amy N, DO   75 mg at 09/09/22 0839   carvedilol (COREG) tablet 3.125 mg  3.125 mg Oral BID WC Cox, Amy N, DO   3.125 mg at 09/09/22 0839   cefTRIAXone (ROCEPHIN) 2 g in sodium chloride 0.9 % 100 mL IVPB  2 g Intravenous Q24H Tsosie Billing, MD   Stopped at 09/09/22 0602   Chlorhexidine Gluconate Cloth 2 % PADS 6 each  6 each Topical Daily Emeterio Reeve, DO   6 each at 09/09/22 0919   enoxaparin (LOVENOX) injection 40 mg  40 mg Subcutaneous Q24H Emeterio Reeve, DO   40 mg at 09/08/22 2253   feeding supplement (ENSURE ENLIVE / ENSURE PLUS) liquid 237 mL  237 mL Oral TID BM Emeterio Reeve, DO   237 mL  at 09/09/22 1339   levothyroxine (SYNTHROID) tablet 100 mcg  100 mcg Oral Q0600 Cox, Amy N, DO   100 mcg at 09/09/22 0526   metoCLOPramide (REGLAN) tablet 5-10 mg  5-10 mg Oral Q8H PRN Cox, Amy N, DO       Or   metoCLOPramide (REGLAN) injection 5-10 mg  5-10 mg Intravenous Q8H PRN Cox, Amy N, DO       montelukast (SINGULAIR) tablet 10 mg  10 mg Oral QHS Cox, Amy N, DO   10 mg at 09/08/22 2253   morphine (PF) 2 MG/ML injection 2 mg  2 mg Intravenous Q3H PRN Agbata, Tochukwu, MD   2 mg at  09/09/22 1128   multivitamin with minerals tablet 1 tablet  1 tablet Oral Daily Emeterio Reeve, DO   1 tablet at 09/09/22 0839   ondansetron (ZOFRAN) tablet 4 mg  4 mg Oral Q6H PRN Cox, Amy N, DO       Or   ondansetron (ZOFRAN) injection 4 mg  4 mg Intravenous Q6H PRN Cox, Amy N, DO       oxyCODONE (Oxy IR/ROXICODONE) immediate release tablet 5 mg  5 mg Oral Q4H PRN Emeterio Reeve, DO   5 mg at 09/08/22 2008   polyethylene glycol (MIRALAX / GLYCOLAX) packet 17 g  17 g Oral BID Emeterio Reeve, DO   17 g at 09/09/22 [id number]   spironolactone (ALDACTONE) tablet 25 mg  25 mg Oral Daily Cox, Amy N, DO   25 mg at 09/09/22 [id number]   Facility-Administered Medications Ordered in Other Encounters  Medication Dose Route Frequency Provider Last Rate Last Admin   0.9 %  sodium chloride infusion   Intravenous Continuous Monia Sabal, PA-C         Abtx:  Anti-infectives (From admission, onward)    Start     Dose/Rate Route Frequency Ordered Stop   09/07/22 0600  cefTRIAXone (ROCEPHIN) 2 g in sodium chloride 0.9 % 100 mL IVPB        2 g 200 mL/hr over 30 Minutes Intravenous Every 24 hours 09/06/22 1352     09/05/22 1752  ceFEPIme (MAXIPIME) 2 g in sodium chloride 0.9 % 100 mL IVPB        2 g 200 mL/hr over 30 Minutes Intravenous Every 12 hours 09/05/22 0737 09/06/22 1758   09/04/22 1700  vancomycin (VANCOREADY) IVPB 1250 mg/250 mL  Status:  Discontinued        1,250 mg 166.7 mL/hr over 90 Minutes Intravenous  Every 24 hours 09/03/22 1536 09/06/22 1332   09/04/22 1400  ceFEPIme (MAXIPIME) 2 g in sodium chloride 0.9 % 100 mL IVPB  Status:  Discontinued        2 g 200 mL/hr over 30 Minutes Intravenous Every 8 hours 09/04/22 1242 09/05/22 0738   09/04/22 0600  ceFEPIme (MAXIPIME) 2 g in sodium chloride 0.9 % 100 mL IVPB  Status:  Discontinued        2 g 200 mL/hr over 30 Minutes Intravenous Every 12 hours 09/03/22 1537 09/04/22 1242   09/03/22 1600  vancomycin (VANCOREADY) IVPB 1750 mg/350 mL  Status:  Discontinued        1,750 mg 175 mL/hr over 120 Minutes Intravenous  Once 09/03/22 1524 09/03/22 1852   09/03/22 1500  ceFEPIme (MAXIPIME) 2 g in sodium chloride 0.9 % 100 mL IVPB        2 g 200 mL/hr over 30 Minutes Intravenous  Once 09/03/22 1453 09/03/22 1651       REVIEW OF SYSTEMS:  Const:  fever, chills, negative weight loss Eyes: negative diplopia or visual changes, negative eye pain ENT: negative coryza, negative sore throat Resp: mild copd- Cards: negative for chest pain, palpitations, lower extremity edema GU: negative for frequency, dysuria and hematuria GI: Negative for abdominal pain, diarrhea, bleeding, constipation Skin: negative for rash and pruritus Heme: negative for easy bruising and gum/nose bleeding MS: left knee pain, left calf pain Neurolo:negative for headaches, dizziness, vertigo, memory problems  Psych: negative for feelings of anxiety, depression  Endocrine: hypothyroidism Allergy/Immunology- negative for any medication or food allergies ?  Objective:  VITALS:  BP 113/64 (BP Location: Right Arm)  Pulse 69   Temp 98.9 F (37.2 C)   Resp 16   Ht 5' 2"$  (1.575 m)   Wt 77.6 kg   SpO2 98%   BMI 31.29 kg/m   O/e awake and alert HEENT - N Lungs CTA Hss1s2 Abd soft MSK- left knee swollen Tender to movt Cns non focal Abd soft   Pertinent Labs Lab Results CBC    Component Value Date/Time   WBC 5.6 09/05/2022 0341   RBC 2.88 (L) 09/05/2022 0341    HGB 8.5 (L) 09/05/2022 0341   HCT 27.0 (L) 09/05/2022 0341   PLT 124 (L) 09/05/2022 0341   MCV 93.8 09/05/2022 0341   MCH 29.5 09/05/2022 0341   MCHC 31.5 09/05/2022 0341   RDW 15.6 (H) 09/05/2022 0341   LYMPHSABS 2.1 09/03/2022 1147   MONOABS 0.6 09/03/2022 1147   EOSABS 0.2 09/03/2022 1147   BASOSABS 0.0 09/03/2022 1147       Latest Ref Rng & Units 09/06/2022    5:04 AM 09/05/2022    3:41 AM 09/04/2022    6:04 AM  CMP  Glucose 70 - 99 mg/dL 99  105  105   BUN 8 - 23 mg/dL 9  10  8   $ Creatinine 0.44 - 1.00 mg/dL 0.76  0.93  0.84   Sodium 135 - 145 mmol/L 135  135  135   Potassium 3.5 - 5.1 mmol/L 3.9  3.0  3.6   Chloride 98 - 111 mmol/L 106  106  105   CO2 22 - 32 mmol/L 20  22  21   $ Calcium 8.9 - 10.3 mg/dL 8.2  8.2  7.7       Microbiology: Recent Results (from the past 240 hour(s))  Body fluid culture w Gram Stain     Status: None   Collection Time: 09/03/22  1:05 PM   Specimen: KNEE; Body Fluid  Result Value Ref Range Status   Specimen Description   Final    KNEE Performed at [facility], [address] [place], [place] [address]    Special Requests   Final    Immunocompromised Performed at [facility], [address] [place], [place] [address]    Gram Stain   Final    RARE WBC PRESENT, PREDOMINANTLY PMN NO ORGANISMS SEEN    Culture   Final    RARE STREPTOCOCCUS PNEUMONIAE CRITICAL RESULT CALLED TO, READ BACK BY AND VERIFIED WITH: RN SHON.C AT 1145 ON 09/05/2022 BY T.SAAD. Performed at [facility], [place] [address], [place],  [address]    Report Status 09/07/2022 FINAL  Final   Organism ID, Bacteria STREPTOCOCCUS PNEUMONIAE  Final      Susceptibility   Streptococcus pneumoniae - MIC*    ERYTHROMYCIN <=0.12 SENSITIVE Sensitive     LEVOFLOXACIN 0.5 SENSITIVE Sensitive     VANCOMYCIN 0.5 SENSITIVE Sensitive     PENICILLIN (meningitis) <=0.06 SENSITIVE Sensitive     PENO - penicillin <=0.06      PENICILLIN  (non-meningitis) <=0.06 SENSITIVE Sensitive     PENICILLIN (oral) <=0.06 SENSITIVE Sensitive     CEFTRIAXONE (non-meningitis) <=0.12 SENSITIVE Sensitive     CEFTRIAXONE (meningitis) <=0.12 SENSITIVE Sensitive     * RARE STREPTOCOCCUS PNEUMONIAE  Aerobic/Anaerobic Culture w Gram Stain (surgical/deep wound)     Status: None (Preliminary result)   Collection Time: 09/03/22  4:47 PM   Specimen: Wound  Result Value Ref Range Status   Specimen Description   Final  WOUND Performed at [facility], [address] [place], [place] [address]    Special Requests   Final    LEFT KNEE FLUID Performed at [facility], [address] [place], [place] [address]    Gram Stain   Final    ABUNDANT WBC PRESENT, PREDOMINANTLY PMN NO ORGANISMS SEEN    Culture   Final    RARE STREPTOCOCCUS PNEUMONIAE SUSCEPTIBILITIES TO FOLLOW Performed at [facility], [place] [address], [place], [place] [address]    Report Status PENDING  Incomplete  MRSA Next Gen by PCR, Nasal     Status: None   Collection Time: 09/06/22  9:23 AM   Specimen: Nasal Mucosa; Nasal Swab  Result Value Ref Range Status   MRSA by PCR Next Gen NOT DETECTED NOT DETECTED Final    Comment: (NOTE) The GeneXpert MRSA Assay (FDA approved for NASAL specimens only), is one component of a comprehensive MRSA colonization surveillance program. It is not intended to diagnose MRSA infection nor to guide or monitor treatment for MRSA infections. Test performance is not FDA approved in patients less than 72 years old. Performed at [facility], [address] [place], [place] [address]   Culture, blood (Routine X 2) w Reflex to ID Panel     Status: None (Preliminary result)   Collection Time: 09/06/22  2:00 PM   Specimen: BLOOD  Result Value Ref Range Status   Specimen Description BLOOD BLOOD RIGHT ARM RAC  Final   Special Requests   Final    BOTTLES DRAWN AEROBIC AND ANAEROBIC Blood Culture adequate volume    Culture   Final    NO GROWTH 3 DAYS Performed at [facility], [address], [place], [place] [address]    Report Status PENDING  Incomplete  Culture, blood (Routine X 2) w Reflex to ID Panel     Status: None (Preliminary result)   Collection Time: 09/06/22  2:10 PM   Specimen: BLOOD  Result Value Ref Range Status   Specimen Description BLOOD BLOOD RIGHT ARM RT THUMB  Final   Special Requests   Final    BOTTLES DRAWN AEROBIC ONLY Blood Culture results may not be optimal due to an inadequate volume of blood received in culture bottles   Culture   Final    NO GROWTH 3 DAYS Performed at [facility], [address] [place], [place] [address]    Report Status PENDING  Incomplete    IMAGING RESULTS:  Xray left knee Moderate effusion Also reported old fracture of fibula I have personally reviewed the films ? Impression/Recommendation ?streptococcus pneumoniae septic arthritis of the left knee is a patient with Myeloma Previous h/o pneumococcus bacteremia in 2022 S/p Arthroscopic debridement of left knee Source not clear, but because of myeloma the risk for pneumococcal infection is increased- also she has COPD No blood culture sent on admission to look for bacteremia Cxr done on 2/13- No pneumonia Continue ceftriaxone for total of 4 weeks    Multiple myeloma- on chemo Has PORT  Hypothyroidism secondary to total thyroidectomy for Grave's disease  Discussed the management with patient in detail Discussed with care team ?

## 2022-09-10 LAB — MULTIPLE MYELOMA PANEL, SERUM
Albumin SerPl Elph-Mcnc: 3.3 g/dL (ref 2.9–4.4)
Albumin/Glob SerPl: 0.8 (ref 0.7–1.7)
Alpha 1: 0.5 g/dL — ABNORMAL HIGH (ref 0.0–0.4)
Alpha2 Glob SerPl Elph-Mcnc: 1 g/dL (ref 0.4–1.0)
B-Globulin SerPl Elph-Mcnc: 1.4 g/dL — ABNORMAL HIGH (ref 0.7–1.3)
Gamma Glob SerPl Elph-Mcnc: 1.7 g/dL (ref 0.4–1.8)
Globulin, Total: 4.6 g/dL — ABNORMAL HIGH (ref 2.2–3.9)
IgA: 1391 mg/dL — ABNORMAL HIGH (ref 87–352)
IgG (Immunoglobin G), Serum: 372 mg/dL — ABNORMAL LOW (ref 586–1602)
IgM (Immunoglobulin M), Srm: <5 mg/dL — ABNORMAL LOW (ref 26–217)
M Protein SerPl Elph-Mcnc: 0.9 g/dL — ABNORMAL HIGH
Total Protein ELP: 7.9 g/dL (ref 6.0–8.5)

## 2022-09-10 MED ORDER — CEFTRIAXONE IV (FOR PTA / DISCHARGE USE ONLY): 2.0000 g | INTRAVENOUS | 0 refills | Status: AC

## 2022-09-10 MED ORDER — ALTEPLASE 2 MG IJ SOLR
2.0000 mg | Freq: Once | INTRAMUSCULAR | Status: AC
  Administered 2022-09-10: 2 mg
  Filled 2022-09-10: qty 2

## 2022-09-10 NOTE — Plan of Care (Signed)

## 2022-09-10 NOTE — Plan of Care (Signed)
  Problem: Clinical Measurements: Goal: Will remain free from infection Outcome: Progressing Goal: Diagnostic test results will improve Outcome: Progressing   Problem: Activity: Goal: Risk for activity intolerance will decrease Outcome: Progressing   Problem: Elimination: Goal: Will not experience complications related to bowel motility Outcome: Progressing   Problem: Pain Managment: Goal: General experience of comfort will improve Outcome: Progressing   Problem: Safety: Goal: Ability to remain free from injury will improve Outcome: Progressing   Problem: Skin Integrity: Goal: Risk for impaired skin integrity will decrease Outcome: Progressing

## 2022-09-10 NOTE — Plan of Care (Signed)
  Problem: Education: Goal: Knowledge of General Education information will improve Description: Including pain rating scale, medication(s)/side effects and non-pharmacologic comfort measures 09/10/2022 0754 by Alferd Apa, RN Outcome: Progressing 09/10/2022 0736 by Alferd Apa, RN Outcome: Progressing   Problem: Health Behavior/Discharge Planning: Goal: Ability to manage health-related needs will improve 09/10/2022 0754 by Alferd Apa, RN Outcome: Progressing 09/10/2022 0736 by Alferd Apa, RN Outcome: Progressing   Problem: Clinical Measurements: Goal: Ability to maintain clinical measurements within normal limits will improve 09/10/2022 0754 by Alferd Apa, RN Outcome: Progressing 09/10/2022 0736 by Alferd Apa, RN Outcome: Progressing Goal: Will remain free from infection 09/10/2022 0754 by Alferd Apa, RN Outcome: Progressing 09/10/2022 0736 by Alferd Apa, RN Outcome: Progressing Goal: Diagnostic test results will improve 09/10/2022 0754 by Alferd Apa, RN Outcome: Progressing 09/10/2022 0736 by Alferd Apa, RN Outcome: Progressing Goal: Respiratory complications will improve 09/10/2022 0754 by Alferd Apa, RN Outcome: Progressing 09/10/2022 0736 by Alferd Apa, RN Outcome: Progressing Goal: Cardiovascular complication will be avoided 09/10/2022 0754 by Alferd Apa, RN Outcome: Progressing 09/10/2022 0736 by Alferd Apa, RN Outcome: Progressing   Problem: Activity: Goal: Risk for activity intolerance will decrease 09/10/2022 0754 by Alferd Apa, RN Outcome: Progressing 09/10/2022 0736 by Alferd Apa, RN Outcome: Progressing   Problem: Nutrition: Goal: Adequate nutrition will be maintained 09/10/2022 0754 by Alferd Apa, RN Outcome: Progressing 09/10/2022 0736 by Alferd Apa, RN Outcome: Progressing   Problem: Coping: Goal: Level of anxiety will decrease 09/10/2022 0754 by Alferd Apa, RN Outcome: Progressing 09/10/2022 0736 by Alferd Apa, RN Outcome: Progressing   Problem: Elimination: Goal: Will not experience complications related to bowel motility 09/10/2022 0754 by Alferd Apa, RN Outcome: Progressing 09/10/2022 0736 by Alferd Apa, RN Outcome: Progressing Goal: Will not experience complications related to urinary retention 09/10/2022 0754 by Alferd Apa, RN Outcome: Progressing 09/10/2022 0736 by Alferd Apa, RN Outcome: Progressing   Problem: Pain Managment: Goal: General experience of comfort will improve 09/10/2022 0754 by Alferd Apa, RN Outcome: Progressing 09/10/2022 0736 by Alferd Apa, RN Outcome: Progressing   Problem: Safety: Goal: Ability to remain free from injury will improve 09/10/2022 0754 by Alferd Apa, RN Outcome: Progressing 09/10/2022 0736 by Alferd Apa, RN Outcome: Progressing   Problem: Skin Integrity: Goal: Risk for impaired skin integrity will decrease 09/10/2022 0754 by Alferd Apa, RN Outcome: Progressing 09/10/2022 0736 by Alferd Apa, RN Outcome: Progressing

## 2022-09-10 NOTE — Progress Notes (Signed)
Port re-accessed for home antibiotic infusions. No blood return noted. Pt reports line usually gives blood return. Needle placement double checked, tPA ordered to de-clot port.

## 2022-09-10 NOTE — Discharge Summary (Signed)
Physician Discharge Summary   Patient: Brandi [name] MRN: [id number] DOB: 1951-05-14  Admit date:     09/03/2022  Discharge date: 09/10/22  Discharge Physician: Oran Rein   PCP: Marguerita Merles, MD   Recommendations at discharge:    Follow up with ortho in 10 days for suture removal Follow up with PCP in 2 weeks  Discharge Diagnoses: Principal Problem:   Septic arthritis due to Streptococcus pneumoniae ([place]) Active Problems:   Multiple myeloma not having achieved remission ([place])   Essential hypertension   S/P autologous bone marrow transplantation ([place])   Chronic heart failure with preserved ejection fraction (HFpEF) (HCC)   Hyponatremia   Hypothyroidism   COPD (chronic obstructive pulmonary disease) (HCC)   GERD (gastroesophageal reflux disease)   Septic arthritis of knee, left (HCC)   Leg swelling  Resolved Problems:   * No resolved hospital problems. *  Hospital Course: Brandi [name] is a 72 year old female with history of multiple myeloma, who presents from oncology clinic for chief concerns of 3 days of worsening left knee pain. 02/09: to ED, admitted to hospitalist service, to OR for arthroscopic joint washout w/ Dr. Harlow [name], continued on vancomycin and cefepime pending joint fluid cultures. DVT ruled out.  02/10: PT recs for SNF but pt states she is not interested in this at this time. Awaiting Cx from joint aspirate. Pain control.  02/11: Cx (+)strep pneumo, reincubated. Ortho recs for ID consult  02/12: ID consult - d/c vanc, change cefepime to ceftriaxone, obtain BCx, CXR, TTE 02/13: Per ortho, ok to d/c from their standpoint. Working w/ PT. Await BCx (NG <12h) and TTE and ID recs.   2/16 : Patient seen and examined this morning no overnight events.  Patient planned to be discharged to home with follow-up with Ortho for removal of sutures.  Consultants:  Orthopedics Infectious Disease   Procedures: 09/03/2022 joint aspiration in ED  09/03/2022  arthroscopic L knee debridement   ASSESSMENT & PLAN:   Principal Problem:   Septic arthritis of knee, left (HCC) Active Problems:   Multiple myeloma not having achieved remission (HCC)   Essential hypertension   S/P autologous bone marrow transplantation (HCC)   Chronic heart failure with preserved ejection fraction (HFpEF) (HCC)   Hyponatremia   Hypothyroidism   COPD (chronic obstructive pulmonary disease) (HCC)   GERD (gastroesophageal reflux disease)   Leg swelling  Septic arthritis of knee, left (HCC) DVT r/o Body fluid from left knee aspirate (+) strep pneumo   Had been on vancomycin and cefepime x4 days pending ID consult  today 09/07/22 is Day 5 total abx, vanc has been d/c, cefepime changed to ceftriaxone pending MIC/susceptibility per ID - appreciate recs  Myeloma complicates strep pneumo picture: CXR and BCx sent, TTE ordered  Pain control: Acetaminophen 650 mg p.o. every 6 hours as needed for mild pain, fever, headache; oxycodone previously at 10 mg q4h, then q6h, tapering down today to oxycodone 5 mg q4h, morphine for breakthrough but hopefully can come off this soon  OK to d/c per ortho, follow in clinic in 7-10 days for suture removal (02/20-02/23)  Constipation Bowel regimen increased today   Hypokalemia Replaced Monitor BMP  Hypothyroidism Levothyroxine 100 mcg daily resumed before breakfast  Essential hypertension Coreg 3.125 mg p.o. twice daily resumed Hydralazine 5 mg every 8 hours as needed for SBP greater than 180, 4 days ordered  Chronic heart failure with preserved ejection fraction (HFpEF) ([place]) Patient not in acute exacerbation Strict I's and O's  COPD (chronic obstructive pulmonary disease) ([place]) Patient is not in acute exacerbation DuoNebs every 6 hours as needed for shortness of breath and wheezing  Hyponatremia - resolved w /IV fluids  BMP in a.m.    DVT prophylaxis: lovenox  Pertinent IV fluids/nutrition: no continuous IV fluids,  tolerating diet  Central lines / invasive devices: none  Code Status: FULL CODE  Current Admission Status: inpatient  TOC needs / Dispo plan: SNF vs HH - pt has declined SNF, TOC following, PT/OT to work w/ her while she is here  Barriers to discharge / significant pending items: pain control, ID recs, Echo and BCx pending       Pain control - [place] was reviewed. and patient was instructed, not to drive, operate heavy machinery, perform activities at heights, swimming or participation in water activities or provide baby-sitting services while on Pain, Sleep and Anxiety Medications; until their outpatient Physician has advised to do so again. Also recommended to not to take more than prescribed Pain, Sleep and Anxiety Medications.  Consultants: ortho and ID  Procedures performed: Wash out Disposition: Home Diet recommendation:  Discharge Diet Orders (From admission, onward)     Start     Ordered   09/10/22 0000  Diet - low sodium heart healthy        09/10/22 1355           Cardiac diet DISCHARGE MEDICATION: Allergies as of 09/10/2022   No Known Allergies      Medication List     TAKE these medications    acyclovir 400 MG tablet Commonly known as: ZOVIRAX Take 1 tablet (400 mg total) by mouth 2 (two) times daily.   albuterol 108 (90 Base) MCG/ACT inhaler Commonly known as: VENTOLIN HFA Inhale 2 puffs into the lungs every 6 (six) hours as needed for wheezing or shortness of breath.   aspirin EC 81 MG tablet Take 1 tablet (81 mg total) by mouth daily.   benzonatate 100 MG capsule Commonly known as: TESSALON Take 1 capsule (100 mg total) by mouth 2 (two) times daily as needed for cough.   buPROPion 75 MG tablet Commonly known as: WELLBUTRIN Take 75 mg by mouth daily.   calcium-vitamin D 500-5 MG-MCG tablet Commonly known as: OSCAL WITH D Take 1 tablet by mouth 2 (two) times daily.   carvedilol 3.125 MG  tablet Commonly known as: Coreg Take 1 tablet (3.125 mg total) by mouth 2 (two) times daily with a meal.   cefTRIAXone  IVPB Commonly known as: ROCEPHIN Inject 2 g into the vein daily for 22 days. Indication:  S. Pneumoniae septic arthritis of knee Last Day of Therapy: 10/01/2022 Labs - Once weekly:  CBC/D and CMP Fax weekly lab results  promptly to [phone] [id number] Adminsiter via Portacath Method of administration: IV Push Method of administration may be changed at the discretion of home infusion pharmacist based upon assessment of the patient and/or caregiver's ability to self-administer the medication ordered.   dexamethasone 4 MG tablet Commonly known as: DECADRON Take dexamethasone 20 mg [5 pills] weekly on Thursdays. # Do not take dexamethasone in the week of chemotherapy. Take in the mornings   furosemide 40 MG tablet Commonly known as: Lasix Take 1 tablet (40 mg total) by mouth daily.   lenalidomide 15 MG capsule Commonly known as: REVLIMID Take 1 capsule (15 mg total) by mouth daily. Take for 21 days, then hold for 7 days. Repeat every 28 days.  levothyroxine 100 MCG tablet Commonly known as: SYNTHROID TAKE 1 TABLET BY MOUTH BEFORE BREAKFAST   lidocaine-prilocaine cream Commonly known as: EMLA Apply 1 application topically as needed. Apply to port and cover with saran wrap 1-2 hours prior to port access   magnesium oxide 400 (240 Mg) MG tablet Commonly known as: MAG-OX Take 1 tablet (400 mg total) by mouth 2 (two) times daily.   montelukast 10 MG tablet Commonly known as: SINGULAIR Take 1 tablet (10 mg total) by mouth at bedtime.   ondansetron 8 MG tablet Commonly known as: ZOFRAN One pill every 8 hours as needed for nausea/vomitting.   pantoprazole 40 MG tablet Commonly known as: PROTONIX Take 1 tablet (40 mg total) by mouth daily.   polyethylene glycol 17 g packet Commonly known as: MIRALAX / GLYCOLAX Take 17 g by mouth daily as needed for mild  constipation.   potassium chloride SA 20 MEQ tablet Commonly known as: KLOR-CON M Take 1 tablet (20 mEq total) by mouth 2 (two) times daily.   spironolactone 25 MG tablet Commonly known as: ALDACTONE Take 1 tablet by mouth once daily   traMADol 50 MG tablet Commonly known as: ULTRAM TAKE 1 TABLET BY MOUTH EVERY 12 HOURS AS NEEDED   Vitamin D (Ergocalciferol) 1.25 MG (50000 UNIT) Caps capsule Commonly known as: DRISDOL Take 1 capsule by mouth once a week               Durable Medical Equipment  (From admission, onward)           Start     Ordered   09/07/22 0914  For home use only DME Walker rolling  Once       Question Answer Comment  Walker: With [place] Wheels   Patient needs a walker to treat with the following condition Septic arthritis ([place])      09/07/22 0914   09/06/22 1524  For home use only DME standard manual wheelchair with seat cushion  Once       Comments: Patient suffers from septic arthritis which impairs their ability to perform daily activities like bathing, dressing, grooming, and toileting in the home.  A cane, crutch, or walker will not resolve issue with performing activities of daily living. A wheelchair will allow patient to safely perform daily activities. Patient can safely propel the wheelchair in the home or has a caregiver who can provide assistance. Length of need 6 months . Accessories: elevating leg rests (ELRs), wheel locks, extensions and anti-tippers.   09/06/22 1523              Discharge Care Instructions  (From admission, onward)           Start     Ordered   09/10/22 0000  Change dressing on IV access line weekly and PRN  (Home infusion instructions - [facility] )        09/10/22 1353            Follow-up Information     [facility].   Specialty: Home Health Services Why: They will follow up with you for your home health needs. Start of care Sunday or Monday (2/18 or  2/19). Contact information: [place] [address] [phone]                Discharge Exam: Filed Weights   09/03/22 1143 09/03/22 1557  Weight: 77.6 kg 77.6 kg     Condition  at discharge: good  The results of significant diagnostics from this hospitalization (including imaging, microbiology, ancillary and laboratory) are listed below for reference.   Imaging Studies: ECHOCARDIOGRAM COMPLETE  Result Date: 09/07/2022    ECHOCARDIOGRAM REPORT   Patient Name:   MARZEE KEADY Date of Exam: 09/07/2022 Medical Rec #:  [id number]         Height:       62.0 in Accession #:    [id number]        Weight:       171.1 lb Date of Birth:  [birth year]         BSA:          1.789 m Patient Age:    45 years          BP:           130/78 mmHg Patient Gender: F                 HR:           69 bpm. Exam Location:  [facility] Procedure: 2D Echo, Cardiac Doppler and Color Doppler Indications:     Endodarditis  History:         Patient has prior history of Echocardiogram examinations, most                  recent 08/20/2019. COPD; Risk Factors:Hypertension.                  Endocarditis.  Sonographer:     Wenda Low Referring Phys:  [id number] Tsosie Billing Diagnosing Phys: Nelva Bush MD IMPRESSIONS  1. Left ventricular ejection fraction, by estimation, is 50 to 55%. The left ventricle has low normal function. The left ventricle has no regional wall motion abnormalities. Left ventricular diastolic parameters were normal.  2. Right ventricular systolic function is normal. The right ventricular size is mildly enlarged.  3. The mitral valve is grossly normal. Mild mitral valve regurgitation. No evidence of mitral stenosis.  4. Tricuspid valve regurgitation is mild to moderate.  5. The aortic valve was not well visualized. There is mild thickening of the aortic valve. Aortic valve regurgitation is not visualized. Aortic valve sclerosis is present, with no evidence of aortic  valve stenosis. Conclusion(s)/Recommendation(s): There is no definite evidence of endocarditis, though native valvular disease is present. If clinical concern persists, transesophageal echocardiogram should be considered. FINDINGS  Left Ventricle: Left ventricular ejection fraction, by estimation, is 50 to 55%. The left ventricle has low normal function. The left ventricle has no regional wall motion abnormalities. The left ventricular internal cavity size was normal in size. There is no left ventricular hypertrophy. Left ventricular diastolic parameters were normal. Right Ventricle: The right ventricular size is mildly enlarged. No increase in right ventricular wall thickness. Right ventricular systolic function is normal. Left Atrium: Left atrial size was normal in size. Right Atrium: Right atrial size was normal in size. Pericardium: There is no evidence of pericardial effusion. Mitral Valve: The mitral valve is grossly normal. Mild mitral valve regurgitation. No evidence of mitral valve stenosis. MV peak gradient, 4.0 mmHg. The mean mitral valve gradient is 1.0 mmHg. Tricuspid Valve: The tricuspid valve is not well visualized. Tricuspid valve regurgitation is mild to moderate. Aortic Valve: The aortic valve was not well visualized. There is mild thickening of the aortic valve. Aortic valve regurgitation is not visualized. Aortic valve sclerosis is present, with no evidence of aortic valve stenosis. Aortic valve mean gradient measures 3.0 mmHg.  Aortic valve peak gradient measures 6.1 mmHg. Aortic valve area, by VTI measures 2.66 cm. Pulmonic Valve: The pulmonic valve was normal in structure. Pulmonic valve regurgitation is mild. No evidence of pulmonic stenosis. Aorta: The aortic root is normal in size and structure. Pulmonary Artery: The pulmonary artery is not well seen. Venous: The inferior vena cava was not well visualized. IAS/Shunts: No atrial level shunt detected by color flow Doppler.  LEFT VENTRICLE  PLAX 2D LVIDd:         4.80 cm     Diastology LVIDs:         3.00 cm     LV e' medial:    12.10 cm/s LV PW:         0.90 cm     LV E/e' medial:  5.8 LV IVS:        1.00 cm     LV e' lateral:   10.60 cm/s LVOT diam:     2.00 cm     LV E/e' lateral: 6.6 LV SV:         68 LV SV Index:   38 LVOT Area:     3.14 cm  LV Volumes (MOD) LV vol d, MOD A2C: 79.5 ml LV vol d, MOD A4C: 68.2 ml LV vol s, MOD A2C: 38.6 ml LV vol s, MOD A4C: 36.6 ml LV SV MOD A2C:     40.8 ml LV SV MOD A4C:     68.2 ml LV SV MOD BP:      39.9 ml RIGHT VENTRICLE RV Basal diam:  4.40 cm RV Mid diam:    3.00 cm RV S prime:     12.80 cm/s TAPSE (M-mode): 2.2 cm LEFT ATRIUM             Index        RIGHT ATRIUM           Index LA diam:        3.30 cm 1.84 cm/m   RA Area:     17.40 cm LA Vol (A2C):   48.1 ml 26.89 ml/m  RA Volume:   45.30 ml  25.32 ml/m LA Vol (A4C):   40.1 ml 22.42 ml/m LA Biplane Vol: 44.9 ml 25.10 ml/m  AORTIC VALVE                    PULMONIC VALVE AV Area (Vmax):    2.78 cm     PV Vmax:       0.82 m/s AV Area (Vmean):   2.19 cm     PV Peak grad:  2.7 mmHg AV Area (VTI):     2.66 cm AV Vmax:           123.00 cm/s AV Vmean:          85.700 cm/s AV VTI:            0.255 m AV Peak Grad:      6.1 mmHg AV Mean Grad:      3.0 mmHg LVOT Vmax:         109.00 cm/s LVOT Vmean:        59.700 cm/s LVOT VTI:          0.216 m LVOT/AV VTI ratio: 0.85  AORTA Ao Root diam: 3.30 cm MITRAL VALVE               TRICUSPID VALVE MV Area (PHT): 3.99 cm    TR Peak grad:   27.0 mmHg  MV Area VTI:   2.29 cm    TR Vmax:        260.00 cm/s MV Peak grad:  4.0 mmHg MV Mean grad:  1.0 mmHg    SHUNTS MV Vmax:       0.99 m/s    Systemic VTI:  0.22 m MV Vmean:      54.1 cm/s   Systemic Diam: 2.00 cm MV Decel Time: 190 msec MV E velocity: 69.90 cm/s MV A velocity: 79.80 cm/s MV E/A ratio:  0.88 Nelva Bush MD Electronically signed by Nelva Bush MD Signature Date/Time: 09/07/2022/6:56:35 PM    Final    DG Chest 2 View  Result Date:  09/06/2022 CLINICAL DATA:  Pneumococcus infection. EXAM: CHEST - 2 VIEW COMPARISON:  Chest radiograph Dec 07, 2021. FINDINGS: Accessed right chest Port-A-Cath with tip at the superior cavoatrial junction. The heart size and mediastinal contours are within normal limits. Aortic atherosclerosis. Similar chronic peribronchial thickening. Low lung volumes with bibasilar atelectasis. No pleural effusion. No pneumothorax. No acute osseous abnormality. IMPRESSION: 1. Low lung volumes with bibasilar atelectasis. 2. Similar chronic peribronchial thickening. Electronically Signed   By: Dahlia Bailiff M.D.   On: 09/06/2022 17:01   US Venous Img Lower Unilateral Left (DVT)  Result Date: 09/03/2022 CLINICAL DATA:  Pain and swelling EXAM: Left LOWER EXTREMITY VENOUS DOPPLER ULTRASOUND TECHNIQUE: Gray-scale sonography with compression, as well as color and duplex ultrasound, were performed to evaluate the deep venous system(s) from the level of the common femoral vein through the popliteal and proximal calf veins. COMPARISON:  None Available. FINDINGS: VENOUS Normal compressibility of the common femoral, superficial femoral, and popliteal veins, as well as the visualized calf veins. Visualized portions of profunda femoral vein and great saphenous vein unremarkable. No filling defects to suggest DVT on grayscale or color Doppler imaging. Doppler waveforms show normal direction of venous flow, normal respiratory plasticity and response to augmentation. Limited views of the contralateral common femoral vein are unremarkable. OTHER None. Limitations: none IMPRESSION: Negative. Electronically Signed   By: Donavan Foil M.D.   On: 09/03/2022 22:39   DG Knee Complete 4 Views Left  Result Date: 09/03/2022 CLINICAL DATA:  Left knee pain. Evaluate for fracture and infection. EXAM: LEFT KNEE - COMPLETE 4+ VIEW COMPARISON:  Bone survey 04/08/2017 FINDINGS: Left knee is located with a moderate sized suprapatellar joint effusion. Mild  medial joint space narrowing. Osteophytosis in the medial knee compartment and patellofemoral compartment. Old fracture involving the proximal fibula. Atherosclerotic calcifications in the left lower extremity. IMPRESSION: 1. No acute bone abnormality in the left knee. 2. Moderate-sized joint effusion. 3. 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 4. Old fracture involving the proximal fibula. Electronically Signed   By: Markus Daft M.D.   On: 09/03/2022 12:50    Microbiology: Results for orders placed or performed during the hospital encounter of 09/03/22  Body fluid culture w Gram Stain     Status: None   Collection Time: 09/03/22  1:05 PM   Specimen: KNEE; Body Fluid  Result Value Ref Range Status   Specimen Description   Final    KNEE Performed at [facility], [address], [place], [place] [address]    Special Requests   Final    Immunocompromised Performed at [facility], [address] [place], [place] [address]    Gram Stain   Final    RARE WBC PRESENT, PREDOMINANTLY PMN NO ORGANISMS SEEN    Culture   Final  RARE STREPTOCOCCUS PNEUMONIAE CRITICAL RESULT CALLED TO, READ BACK BY AND VERIFIED WITH: RN SHON.C AT 1145 ON 09/05/2022 BY T.SAAD. Performed at [facility], [place] [address], [place], [place] [address]    Report Status 09/07/2022 FINAL  Final   Organism ID, Bacteria STREPTOCOCCUS PNEUMONIAE  Final      Susceptibility   Streptococcus pneumoniae - MIC*    ERYTHROMYCIN <=0.12 SENSITIVE Sensitive     LEVOFLOXACIN 0.5 SENSITIVE Sensitive     VANCOMYCIN 0.5 SENSITIVE Sensitive     PENICILLIN (meningitis) <=0.06 SENSITIVE Sensitive     PENO - penicillin <=0.06      PENICILLIN (non-meningitis) <=0.06 SENSITIVE Sensitive     PENICILLIN (oral) <=0.06 SENSITIVE Sensitive     CEFTRIAXONE (non-meningitis) <=0.12 SENSITIVE Sensitive     CEFTRIAXONE (meningitis) <=0.12 SENSITIVE Sensitive     * RARE STREPTOCOCCUS PNEUMONIAE  MRSA Next Gen by PCR, Nasal      Status: None   Collection Time: 09/06/22  9:23 AM   Specimen: Nasal Mucosa; Nasal Swab  Result Value Ref Range Status   MRSA by PCR Next Gen NOT DETECTED NOT DETECTED Final    Comment: (NOTE) The GeneXpert MRSA Assay (FDA approved for NASAL specimens only), is one component of a comprehensive MRSA colonization surveillance program. It is not intended to diagnose MRSA infection nor to guide or monitor treatment for MRSA infections. Test performance is not FDA approved in patients less than 18 years old. Performed at [facility], [address] [place], [place] [address]   Culture, blood (Routine X 2) w Reflex to ID Panel     Status: None (Preliminary result)   Collection Time: 09/06/22  2:00 PM   Specimen: BLOOD  Result Value Ref Range Status   Specimen Description BLOOD BLOOD RIGHT ARM RAC  Final   Special Requests   Final    BOTTLES DRAWN AEROBIC AND ANAEROBIC Blood Culture adequate volume   Culture   Final    NO GROWTH 4 DAYS Performed at [facility], [address] [place], [place] [address]    Report Status PENDING  Incomplete  Culture, blood (Routine X 2) w Reflex to ID Panel     Status: None (Preliminary result)   Collection Time: 09/06/22  2:10 PM   Specimen: BLOOD  Result Value Ref Range Status   Specimen Description BLOOD BLOOD RIGHT ARM RT THUMB  Final   Special Requests   Final    BOTTLES DRAWN AEROBIC ONLY Blood Culture results may not be optimal due to an inadequate volume of blood received in culture bottles   Culture   Final    NO GROWTH 4 DAYS Performed at [facility], [address] [place], [place] [address]    Report Status PENDING  Incomplete    Labs: CBC: Recent Labs  Lab 09/04/22 0604 09/05/22 0341  WBC 7.6 5.6  HGB 8.4* 8.5*  HCT 26.5* 27.0*  MCV 95.3 93.8  PLT 142* [id number]*   Basic Metabolic Panel: Recent Labs  Lab 09/03/22 1910 09/04/22 0604 09/05/22 0341 09/06/22 0504  NA  --  135 135 135  K  --   3.6 3.0* 3.9  CL  --  105 106 106  CO2  --  21* 22 20*  GLUCOSE 97 105* 105* 99  BUN  --  8 10 9  $ CREATININE  --  0.84 0.93 0.76  CALCIUM  --  7.7* 8.2* 8.2*   Liver Function Tests: No results for input(s): "  AST", "ALT", "ALKPHOS", "BILITOT", "PROT", "ALBUMIN" in the last 168 hours. CBG: No results for input(s): "GLUCAP" in the last 168 hours.  Discharge time spent: greater than 30 minutes.  Signed: Oran Rein, MD [facility] 09/10/2022

## 2022-09-10 NOTE — Plan of Care (Signed)
Problem: Education: Goal: Knowledge of General Education information will improve Description: Including pain rating scale, medication(s)/side effects and non-pharmacologic comfort measures 09/10/2022 1454 by Alferd Apa, RN Outcome: Adequate for Discharge 09/10/2022 0754 by Alferd Apa, RN Outcome: Progressing 09/10/2022 0736 by Alferd Apa, RN Outcome: Progressing   Problem: Health Behavior/Discharge Planning: Goal: Ability to manage health-related needs will improve 09/10/2022 1454 by Alferd Apa, RN Outcome: Adequate for Discharge 09/10/2022 0754 by Alferd Apa, RN Outcome: Progressing 09/10/2022 0736 by Alferd Apa, RN Outcome: Progressing   Problem: Clinical Measurements: Goal: Ability to maintain clinical measurements within normal limits will improve 09/10/2022 1454 by Alferd Apa, RN Outcome: Adequate for Discharge 09/10/2022 0754 by Alferd Apa, RN Outcome: Progressing 09/10/2022 0736 by Alferd Apa, RN Outcome: Progressing Goal: Will remain free from infection 09/10/2022 1454 by Alferd Apa, RN Outcome: Adequate for Discharge 09/10/2022 0754 by Alferd Apa, RN Outcome: Progressing 09/10/2022 0736 by Alferd Apa, RN Outcome: Progressing Goal: Diagnostic test results will improve 09/10/2022 1454 by Alferd Apa, RN Outcome: Adequate for Discharge 09/10/2022 0754 by Alferd Apa, RN Outcome: Progressing 09/10/2022 0736 by Alferd Apa, RN Outcome: Progressing Goal: Respiratory complications will improve 09/10/2022 1454 by Alferd Apa, RN Outcome: Adequate for Discharge 09/10/2022 0754 by Alferd Apa, RN Outcome: Progressing 09/10/2022 0736 by Alferd Apa, RN Outcome: Progressing Goal: Cardiovascular complication will be avoided 09/10/2022 1454 by Alferd Apa, RN Outcome: Adequate for Discharge 09/10/2022 0754 by Alferd Apa, RN Outcome: Progressing 09/10/2022 0736 by Alferd Apa, RN Outcome: Progressing   Problem: Activity: Goal:  Risk for activity intolerance will decrease 09/10/2022 1454 by Alferd Apa, RN Outcome: Adequate for Discharge 09/10/2022 0754 by Alferd Apa, RN Outcome: Progressing 09/10/2022 0736 by Alferd Apa, RN Outcome: Progressing   Problem: Nutrition: Goal: Adequate nutrition will be maintained 09/10/2022 1454 by Alferd Apa, RN Outcome: Adequate for Discharge 09/10/2022 0754 by Alferd Apa, RN Outcome: Progressing 09/10/2022 0736 by Alferd Apa, RN Outcome: Progressing   Problem: Coping: Goal: Level of anxiety will decrease 09/10/2022 1454 by Alferd Apa, RN Outcome: Adequate for Discharge 09/10/2022 0754 by Alferd Apa, RN Outcome: Progressing 09/10/2022 0736 by Alferd Apa, RN Outcome: Progressing   Problem: Elimination: Goal: Will not experience complications related to bowel motility 09/10/2022 1454 by Alferd Apa, RN Outcome: Adequate for Discharge 09/10/2022 0754 by Alferd Apa, RN Outcome: Progressing 09/10/2022 0736 by Alferd Apa, RN Outcome: Progressing Goal: Will not experience complications related to urinary retention 09/10/2022 1454 by Alferd Apa, RN Outcome: Adequate for Discharge 09/10/2022 0754 by Alferd Apa, RN Outcome: Progressing 09/10/2022 0736 by Alferd Apa, RN Outcome: Progressing   Problem: Pain Managment: Goal: General experience of comfort will improve 09/10/2022 1454 by Alferd Apa, RN Outcome: Adequate for Discharge 09/10/2022 0754 by Alferd Apa, RN Outcome: Progressing 09/10/2022 0736 by Alferd Apa, RN Outcome: Progressing   Problem: Safety: Goal: Ability to remain free from injury will improve 09/10/2022 1454 by Alferd Apa, RN Outcome: Adequate for Discharge 09/10/2022 0754 by Alferd Apa, RN Outcome: Progressing 09/10/2022 0736 by Alferd Apa, RN Outcome: Progressing   Problem: Skin Integrity: Goal: Risk for impaired skin integrity will decrease 09/10/2022 1454 by Alferd Apa, RN Outcome: Adequate for  Discharge 09/10/2022 0754 by Alferd Apa, RN Outcome: Progressing 09/10/2022 0736 by Alferd Apa, RN  Outcome: Progressing   

## 2022-09-10 NOTE — TOC Progression Note (Signed)
Transition of Care [facility]) - Progression Note    Patient Details  Name: Brandi [name] MRN: [id number] Date of Birth: 08-12-1950  Transition of Care [facility]) CM/SW [place], RN Phone Number: 09/10/2022, 10:47 AM  Clinical Narrative:    Confirmed with Pam with [employer] that [facility] will provide a nurse until [facility] can start with the patient Pam is scheduled to come to the hospital to do the teaching today to prepare for DC   Expected Discharge Plan: [place] Barriers to Discharge: Continued Medical Work up  Expected Discharge Plan and Services       Living arrangements for the past 2 months: Single Family Home                                       Social Determinants of Health (SDOH) Interventions SDOH Screenings   Food Insecurity: No Food Insecurity (09/04/2022)  Housing: Low Risk  (09/04/2022)  Transportation Needs: No Transportation Needs (09/04/2022)  Utilities: Not At Risk (09/04/2022)  Tobacco Use: Medium Risk (09/04/2022)    Readmission Risk Interventions    05/27/2021   10:42 AM  Readmission Risk Prevention Plan  Transportation Screening Complete  Medication Review ([place]) Complete  PCP or Specialist appointment within 3-5 days of discharge Complete  HRI or [place] Complete  SW Recovery Care/Counseling Consult Complete  Palliative Care Screening Complete  [place] Not Applicable

## 2022-09-11 LAB — AEROBIC/ANAEROBIC CULTURE W GRAM STAIN (SURGICAL/DEEP WOUND)

## 2022-09-11 LAB — CULTURE, BLOOD (ROUTINE X 2)
Culture: NO GROWTH
Culture: NO GROWTH
Special Requests: ADEQUATE

## 2022-09-13 ENCOUNTER — Other Ambulatory Visit: Payer: Self-pay

## 2022-09-13 DIAGNOSIS — C9002 Multiple myeloma in relapse: Secondary | ICD-10-CM

## 2022-09-13 NOTE — Telephone Encounter (Signed)
Recent hospital admission with dc on 09/10/22

## 2022-09-14 ENCOUNTER — Telehealth: Payer: Self-pay | Admitting: Internal Medicine

## 2022-09-14 NOTE — Telephone Encounter (Signed)
Will hold off refilling lenalidomide given the concerns of progression; and also recent admission to the hospital for septic arthritis.    Please inform patient that we are holding off lenalidomide for now. Will discuss further at next visit.  GB

## 2022-09-15 ENCOUNTER — Other Ambulatory Visit: Payer: Self-pay | Admitting: *Deleted

## 2022-09-15 DIAGNOSIS — C9002 Multiple myeloma in relapse: Secondary | ICD-10-CM

## 2022-09-15 NOTE — Telephone Encounter (Signed)
MD does not want to refill at this time

## 2022-09-17 ENCOUNTER — Other Ambulatory Visit: Payer: Self-pay

## 2022-09-23 ENCOUNTER — Ambulatory Visit: Payer: 59 | Attending: Infectious Diseases | Admitting: Infectious Diseases

## 2022-09-23 ENCOUNTER — Encounter: Payer: Self-pay | Admitting: Infectious Diseases

## 2022-09-23 VITALS — BP 102/68 | HR 75 | Temp 98.0°F

## 2022-09-23 DIAGNOSIS — Z79624 Long term (current) use of inhibitors of nucleotide synthesis: Secondary | ICD-10-CM | POA: Insufficient documentation

## 2022-09-23 DIAGNOSIS — C9000 Multiple myeloma not having achieved remission: Secondary | ICD-10-CM | POA: Insufficient documentation

## 2022-09-23 DIAGNOSIS — Z79899 Other long term (current) drug therapy: Secondary | ICD-10-CM | POA: Insufficient documentation

## 2022-09-23 DIAGNOSIS — K219 Gastro-esophageal reflux disease without esophagitis: Secondary | ICD-10-CM | POA: Diagnosis not present

## 2022-09-23 DIAGNOSIS — Z87891 Personal history of nicotine dependence: Secondary | ICD-10-CM | POA: Insufficient documentation

## 2022-09-23 DIAGNOSIS — M00162 Pneumococcal arthritis, left knee: Secondary | ICD-10-CM | POA: Diagnosis present

## 2022-09-23 DIAGNOSIS — Z7982 Long term (current) use of aspirin: Secondary | ICD-10-CM | POA: Insufficient documentation

## 2022-09-23 DIAGNOSIS — E890 Postprocedural hypothyroidism: Secondary | ICD-10-CM | POA: Insufficient documentation

## 2022-09-23 DIAGNOSIS — Z9484 Stem cells transplant status: Secondary | ICD-10-CM | POA: Diagnosis not present

## 2022-09-23 DIAGNOSIS — E0500 Thyrotoxicosis with diffuse goiter without thyrotoxic crisis or storm: Secondary | ICD-10-CM | POA: Insufficient documentation

## 2022-09-23 DIAGNOSIS — I110 Hypertensive heart disease with heart failure: Secondary | ICD-10-CM | POA: Diagnosis not present

## 2022-09-23 DIAGNOSIS — J449 Chronic obstructive pulmonary disease, unspecified: Secondary | ICD-10-CM | POA: Diagnosis not present

## 2022-09-23 DIAGNOSIS — Z9481 Bone marrow transplant status: Secondary | ICD-10-CM | POA: Diagnosis not present

## 2022-09-23 DIAGNOSIS — M00262 Other streptococcal arthritis, left knee: Secondary | ICD-10-CM

## 2022-09-23 DIAGNOSIS — I5032 Chronic diastolic (congestive) heart failure: Secondary | ICD-10-CM | POA: Diagnosis not present

## 2022-09-23 DIAGNOSIS — F419 Anxiety disorder, unspecified: Secondary | ICD-10-CM | POA: Diagnosis not present

## 2022-09-23 MED ORDER — AMOXICILLIN 500 MG PO CAPS: 1000.0000 mg | ORAL_CAPSULE | Freq: Three times a day (TID) | ORAL | 1 refills | Status: DC

## 2022-09-23 NOTE — Patient Instructions (Addendum)
You are here for follow up of pneumococcus left knee infection- you are on Iv ceftriaxone- you will complete 4 weeks of the antibiotic on 3/8. You have an appt with Dr.B tomorrow for MM- check ESR/CRP/after Iv you will take a week or 2 of amoxicillin- you need to see Dr,Bowers  [facility] [phone]  [address] [place], [place] [address] You need to get Prevnar 20- you can check with Dr.Miles your PCP

## 2022-09-23 NOTE — Progress Notes (Signed)
NAME: Brandi [name]  DOB: 1951-04-04  MRN: [id number]  Date/Time: 09/23/2022 11:15 AM  Subjective:  Daughter is with her Follow up after recent hospitalization between 2/9-2/16/24 for pneumococcus septic arthritis left knee? Brandi [name] is a 72 y.o. with a history of recurrent multiple myeloma s/p Stem cell transplant 2019, currently on salvage Rx with daratumumab/lenalidomide/dexa followed by [facility] onc,  hypothyroidism secondary to total thyroidectomy for Graves in 2022, strep pneumo bacteremia and pneumonia is March 2022 was in [facility] recently for left knee pain and swelling. In the ED she underwent aspiration of the knee and that had 72K wbc , 96% N. Fluid sent for culture- no blood culture sent  she was taken to OR on2/9/24 and underwent Arthroscopic I/D. Synovial fluid culture positive for strep pnemo She was discharged home ot complete 4 weeks of IV ceftriaxone thru port She is doing better Getting home PT once a week Is waking with a walker Swelling and pain better  Past Medical History:  Diagnosis Date   (HFpEF) heart failure with preserved ejection fraction ([place])    a. 06/2020 Echo: EF 60-65%, no rwma, nl RV size/fxn. Triv MR. Triv TR/AI. Mod elev PASP.   Anxiety    COPD (chronic obstructive pulmonary disease) (HCC)    GERD (gastroesophageal reflux disease)    Hypertension    Hyperthyroidism    Hypokalemia    IgA myeloma (HCC)    Multiple myeloma (HCC)    Pneumonia    Red blood cell antibody positive, compatible PRBC difficult to obtain    S/P autologous bone marrow transplantation [facility])     Past Surgical History:  Procedure Laterality Date   ANTERIOR VITRECTOMY Left 10/07/2021   Procedure: ANTERIOR VITRECTOMY;  Surgeon: Leandrew Koyanagi, MD;  Location: [place];  Service: Ophthalmology;  Laterality: Left;   CATARACT EXTRACTION W/PHACO Left 10/07/2021   Procedure: CATARACT EXTRACTION PHACO AND INTRAOCULAR LENS PLACEMENT ([place]) LEFT VISION BLUE;  Surgeon:  Leandrew Koyanagi, MD;  Location: [place];  Service: Ophthalmology;  Laterality: Left;  kit for manual incision 14.56 01:21.4   CATARACT EXTRACTION W/PHACO Right 10/21/2021   Procedure: CATARACT EXTRACTION PHACO AND INTRAOCULAR LENS PLACEMENT (IOC) RIGHT 11.12 01:48.1;  Surgeon: Leandrew Koyanagi, MD;  Location: [place];  Service: Ophthalmology;  Laterality: Right;   ESOPHAGOGASTRODUODENOSCOPY (EGD) WITH PROPOFOL N/A 03/30/2019   Procedure: ESOPHAGOGASTRODUODENOSCOPY (EGD) WITH PROPOFOL;  Surgeon: Jonathon Bellows, MD;  Location: [facility] ENDOSCOPY;  Service: Gastroenterology;  Laterality: N/A;   IR FLUORO GUIDE PORT INSERTION RIGHT  12/16/2017   IR IMAGING GUIDED PORT INSERTION  06/15/2021   KNEE ARTHROSCOPY Left 09/03/2022   Procedure: ARTHROSCOPY KNEE DEBRIDEMENT;  Surgeon: Lovell Sheehan, MD;  Location: [facility] ORS;  Service: Orthopedics;  Laterality: Left;   THYROIDECTOMY N/A     Social History   Socioeconomic History   Marital status: Single    Spouse name: Not on file   Number of children: 3   Years of education: Not on file   Highest education level: Not on file  Occupational History   Not on file  Tobacco Use   Smoking status: Former    Packs/day: 0.25    Types: Cigarettes    Quit date: 03/18/2021    Years since quitting: 1.5   Smokeless tobacco: Never   Tobacco comments:    2 cigarettes QOD  Vaping Use   Vaping Use: Never used  Substance and Sexual Activity   Alcohol use: No   Drug use: No   Sexual  activity: Not Currently  Other Topics Concern   Not on file  Social History Narrative   Lives at home with family(son): daughter 8 blocks away. States "I'm never alone." Independent at baseline   Social Determinants of Health   Financial Resource Strain: Not on file  Food Insecurity: No Food Insecurity (09/04/2022)   Hunger Vital Sign    Worried About Running Out of Food in the Last Year: Never true    Ran Out of Food in the Last Year: Never true   Transportation Needs: No Transportation Needs (09/04/2022)   PRAPARE - Hydrologist (Medical): No    Lack of Transportation (Non-Medical): No  Physical Activity: Not on file  Stress: Not on file  Social Connections: Not on file  Intimate Partner Violence: Not At Risk (09/04/2022)   Humiliation, Afraid, Rape, and Kick questionnaire    Fear of Current or Ex-Partner: No    Emotionally Abused: No    Physically Abused: No    Sexually Abused: No    Family History  Problem Relation Age of Onset   Pneumonia Mother    Seizures Father    No Known Allergies I? Current Outpatient Medications  Medication Sig Dispense Refill   acyclovir (ZOVIRAX) 400 MG tablet Take 1 tablet (400 mg total) by mouth 2 (two) times daily. 60 tablet 3   albuterol (VENTOLIN HFA) 108 (90 Base) MCG/ACT inhaler Inhale 2 puffs into the lungs every 6 (six) hours as needed for wheezing or shortness of breath. 8 g 3   aspirin EC 81 MG EC tablet Take 1 tablet (81 mg total) by mouth daily. 90 tablet 3   benzonatate (TESSALON) 100 MG capsule Take 1 capsule (100 mg total) by mouth 2 (two) times daily as needed for cough. 40 capsule 0   buPROPion (WELLBUTRIN) 75 MG tablet Take 75 mg by mouth daily.     calcium-vitamin D (OSCAL WITH D) '500MG'$ -200UNIT (5MCG) tablet Take 1 tablet by mouth 2 (two) times daily. 60 tablet 0   carvedilol (COREG) 3.125 MG tablet Take 1 tablet (3.125 mg total) by mouth 2 (two) times daily with a meal. 60 tablet 5   cefTRIAXone (ROCEPHIN) IVPB Inject 2 g into the vein daily for 22 days. Indication:  S. Pneumoniae septic arthritis of knee Last Day of Therapy: 10/01/2022 Labs - Once weekly:  CBC/D and CMP Fax weekly lab results  promptly to [phone] [id number] Adminsiter via Portacath Method of administration: IV Push Method of administration may be changed at the discretion of home infusion pharmacist based upon assessment of the patient and/or caregiver's ability to self-administer the  medication ordered. 23 Units 0   dexamethasone (DECADRON) 4 MG tablet Take dexamethasone 20 mg [5 pills] weekly on Thursdays. # Do not take dexamethasone in the week of chemotherapy. Take in the mornings 60 tablet 4   furosemide (LASIX) 40 MG tablet Take 1 tablet (40 mg total) by mouth daily. 90 tablet 3   lenalidomide (REVLIMID) 15 MG capsule Take 1 capsule (15 mg total) by mouth daily. Take for 21 days, then hold for 7 days. Repeat every 28 days. 21 capsule 0   levothyroxine (SYNTHROID) 100 MCG tablet TAKE 1 TABLET BY MOUTH BEFORE BREAKFAST 90 tablet 0   lidocaine-prilocaine (EMLA) cream Apply 1 application topically as needed. Apply to port and cover with saran wrap 1-2 hours prior to port access 30 g 1   magnesium oxide (MAG-OX) 400 (240 Mg) MG tablet Take 1 tablet (400  mg total) by mouth 2 (two) times daily. 60 tablet 3   montelukast (SINGULAIR) 10 MG tablet Take 1 tablet (10 mg total) by mouth at bedtime. 30 tablet 3   ondansetron (ZOFRAN) 8 MG tablet One pill every 8 hours as needed for nausea/vomitting. 40 tablet 1   polyethylene glycol (MIRALAX / GLYCOLAX) packet Take 17 g by mouth daily as needed for mild constipation. 14 each 0   potassium chloride SA (KLOR-CON M) 20 MEQ tablet Take 1 tablet (20 mEq total) by mouth 2 (two) times daily. 60 tablet 6   spironolactone (ALDACTONE) 25 MG tablet Take 1 tablet by mouth once daily 90 tablet 3   traMADol (ULTRAM) 50 MG tablet TAKE 1 TABLET BY MOUTH EVERY 12 HOURS AS NEEDED 60 tablet 0   Vitamin D, Ergocalciferol, (DRISDOL) 1.25 MG (50000 UNIT) CAPS capsule Take 1 capsule by mouth once a week 12 capsule 0   pantoprazole (PROTONIX) 40 MG tablet Take 1 tablet (40 mg total) by mouth daily. 90 tablet 0   No current facility-administered medications for this visit.   Facility-Administered Medications Ordered in Other Visits  Medication Dose Route Frequency Provider Last Rate Last Admin   0.9 %  sodium chloride infusion   Intravenous Continuous Monia Sabal, PA-C         Abtx:  Anti-infectives (From admission, onward)    None       REVIEW OF SYSTEMS:  Const: negative fever, negative chills, negative weight loss Eyes: negative diplopia or visual changes, negative eye pain ENT: negative coryza, negative sore throat Resp: negative cough, hemoptysis, dyspnea Cards: negative for chest pain, palpitations, lower extremity edema GU: negative for frequency, dysuria and hematuria GI: Negative for abdominal pain, diarrhea, bleeding, constipation Skin: negative for rash and pruritus Heme: negative for easy bruising and gum/nose bleeding MS: as above Neurolo:negative for headaches, dizziness, vertigo, memory problems  Psych: negative for feelings of anxiety, depression  Endocrine: negative for thyroid, diabetes Allergy/Immunology- negative for any medication or food allergies  Objective:  VITALS:  BP 102/68   Pulse 75   Temp 98 F (36.7 C) (Temporal)  LDA port PHYSICAL EXAM:  General: Alert, cooperative, no distress, appears stated age.  Head: Normocephalic, without obvious abnormality, atraumatic. Eyes: Conjunctivae clear, anicteric sclerae. Pupils are equal ENT Nares normal. No drainage or sinus tenderness. Lips, mucosa, and tongue normal. No Thrush Neck: Supple, symmetrical, no adenopathy, thyroid: non tender no carotid bruit and no JVD. Lungs: Clear to auscultation bilaterally. No Wheezing or Rhonchi. No rales. Heart: Regular rate and rhythm, no murmur, rub or gallop. PORT in place Abdomen: did not examine Bowel sounds normal. No masses Extremities: left knee- three sites of arthroscopic entry has suture Swelling much better No erythema or warmth  Able to extend the joint-but not completely  Skin: No rashes or lesions. Or bruising Lymph: Cervical, supraclavicular normal. Neurologic: Grossly non-focal Pertinent Labs Lab Results 09/21/22 Wbc 7.5 Hb 8.6 Plt 336 Cr  1.01  ? Impression/Recommendation ?streptococcus pneumoniae septic arthritis of the left knee  S/p Arthroscopic debridement of left kneeOn No blood culture sent on admission to look for bacteremia . Because of MM and previous h/o strep pneumo bacteremia in 2022 decided to give 4 weeks of IV  ceftriaxone which she will complete on 10/01/22. After that plan to give amoxicillin 1 gram PO tid for 1-2 weeks ESR/CRP needed- will get it at oncology  Previous h/o pneumococcus bacteremia in 2022  pt has not received pneumonia vaccine- she will  need prevnar 20 after she completes antibiotic   Multiple myeloma- on chemo   Hypothyroidism secondary to total thyroidectomy for Grave's disease ? ? ___________________________________________________ Discussed with patient, and her daughter Informed her oncologist Dr.B  Note:  This document was prepared using Dragon voice recognition software and may include unintentional dictation errors.

## 2022-09-24 ENCOUNTER — Other Ambulatory Visit: Payer: Self-pay | Admitting: *Deleted

## 2022-09-24 ENCOUNTER — Inpatient Hospital Stay (HOSPITAL_BASED_OUTPATIENT_CLINIC_OR_DEPARTMENT_OTHER): Payer: 59 | Admitting: Internal Medicine

## 2022-09-24 ENCOUNTER — Inpatient Hospital Stay: Payer: 59 | Attending: Nurse Practitioner

## 2022-09-24 ENCOUNTER — Telehealth: Payer: Self-pay

## 2022-09-24 ENCOUNTER — Encounter: Payer: Self-pay | Admitting: Internal Medicine

## 2022-09-24 VITALS — BP 127/80 | HR 65 | Temp 97.8°F | Resp 16 | Wt 173.0 lb

## 2022-09-24 DIAGNOSIS — C9002 Multiple myeloma in relapse: Secondary | ICD-10-CM | POA: Insufficient documentation

## 2022-09-24 DIAGNOSIS — C9000 Multiple myeloma not having achieved remission: Secondary | ICD-10-CM

## 2022-09-24 DIAGNOSIS — N179 Acute kidney failure, unspecified: Secondary | ICD-10-CM

## 2022-09-24 DIAGNOSIS — E876 Hypokalemia: Secondary | ICD-10-CM | POA: Diagnosis not present

## 2022-09-24 DIAGNOSIS — E559 Vitamin D deficiency, unspecified: Secondary | ICD-10-CM | POA: Diagnosis not present

## 2022-09-24 DIAGNOSIS — Z9484 Stem cells transplant status: Secondary | ICD-10-CM | POA: Diagnosis not present

## 2022-09-24 LAB — COMPREHENSIVE METABOLIC PANEL
ALT: 9 U/L (ref 0–44)
AST: 18 U/L (ref 15–41)
Albumin: 3.3 g/dL — ABNORMAL LOW (ref 3.5–5.0)
Alkaline Phosphatase: 88 U/L (ref 38–126)
Anion gap: 13 (ref 5–15)
BUN: 20 mg/dL (ref 8–23)
CO2: 23 mmol/L (ref 22–32)
Calcium: 8.6 mg/dL — ABNORMAL LOW (ref 8.9–10.3)
Chloride: 102 mmol/L (ref 98–111)
Creatinine, Ser: 0.92 mg/dL (ref 0.44–1.00)
GFR, Estimated: >60 mL/min (ref >60)
Glucose, Bld: 127 mg/dL — ABNORMAL HIGH (ref 70–99)
Potassium: 3.4 mmol/L — ABNORMAL LOW (ref 3.5–5.1)
Sodium: 138 mmol/L (ref 135–145)
Total Bilirubin: 0.3 mg/dL (ref 0.3–1.2)
Total Protein: 8.7 g/dL — ABNORMAL HIGH (ref 6.5–8.1)

## 2022-09-24 LAB — CBC WITH DIFFERENTIAL/PLATELET
Abs Immature Granulocytes: 0.02 10*3/uL (ref 0.00–0.07)
Basophils Absolute: 0 10*3/uL (ref 0.0–0.1)
Basophils Relative: 0 %
Eosinophils Absolute: 0.1 10*3/uL (ref 0.0–0.5)
Eosinophils Relative: 1 %
HCT: 26.7 % — ABNORMAL LOW (ref 36.0–46.0)
Hemoglobin: 8.4 g/dL — ABNORMAL LOW (ref 12.0–15.0)
Immature Granulocytes: 0 %
Lymphocytes Relative: 50 %
Lymphs Abs: 3.6 10*3/uL (ref 0.7–4.0)
MCH: 30.1 pg (ref 26.0–34.0)
MCHC: 31.5 g/dL (ref 30.0–36.0)
MCV: 95.7 fL (ref 80.0–100.0)
Monocytes Absolute: 0.3 10*3/uL (ref 0.1–1.0)
Monocytes Relative: 4 %
Neutro Abs: 3.2 10*3/uL (ref 1.7–7.7)
Neutrophils Relative %: 45 %
Platelets: 236 10*3/uL (ref 150–400)
RBC: 2.79 MIL/uL — ABNORMAL LOW (ref 3.87–5.11)
RDW: 15.7 % — ABNORMAL HIGH (ref 11.5–15.5)
WBC: 7.3 10*3/uL (ref 4.0–10.5)
nRBC: 0 % (ref 0.0–0.2)

## 2022-09-24 LAB — C-REACTIVE PROTEIN: CRP: 0.5 mg/dL (ref <1.0)

## 2022-09-24 LAB — SEDIMENTATION RATE: Sed Rate: 126 mm/hr — ABNORMAL HIGH (ref 0–30)

## 2022-09-24 MED ORDER — SODIUM CHLORIDE 0.9% FLUSH
10.0000 mL | INTRAVENOUS | Status: DC | PRN
  Administered 2022-09-24: 10 mL via INTRAVENOUS
  Filled 2022-09-24: qty 10

## 2022-09-24 MED ORDER — HEPARIN SOD (PORK) LOCK FLUSH 100 UNIT/ML IV SOLN
500.0000 [IU] | Freq: Once | INTRAVENOUS | Status: AC
  Administered 2022-09-24: 500 [IU] via INTRAVENOUS
  Filled 2022-09-24: qty 5

## 2022-09-24 NOTE — Telephone Encounter (Signed)
Scheduling request for ASAP bone marrow bx faxed to specialty scheduling.

## 2022-09-24 NOTE — Assessment & Plan Note (Addendum)
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    # Currently on salvage therapy with daratumumab  lenalidomide [15 mg 3 w; 1-w] dexamethasone.  FEB 2024-lambda light chain 284 M protein 0.9-rising lambda light chain ratio. Discontinue Revlimid; and dara.   # I had a long discussion discussed with patient and daughter that patient's multiple myeloma labs getting worse concerning for progression/refractory disease.  Discussed that patient needs repeat bone marrow biopsy; imaging with PET scan. Currently no evidence of any   # Left knee septic arthritis/immunocompromised host- [s/p Dr.Bowers; ID]; currently-S/p evaluation with ID-on ceftriaxone until March 8th. And then x 2 weeks.   # Iatrogenic hypothyroidism [ Graves/goiter s/p total thyroidectomy;aug,2022 ]-currently on Synthroid 100 mcg/day-September 2023 TSH normal.  Monitor closely.  # Worsening lower back discomfort x 6 months-see above.   # Muscle cramps-?  Revlimid. improved  On tramadol q 12 hours prn-Refill tramadol.await above workup  # Chronic diastolic CHF- stable.   # Hypokalemia- K 3.4 recommend compliance with Kdur BID.  Stable  # Bone lesions /last zometa on 11/27/2017.  Holding Zometa secondary to poor dentition.  Dental extraction appointment appt-on Jan 26th.   # Insomnia- recommend restart melatonin 1 to 2 mg nightly as needed.   Stable  # IV access: mediport- functional  # Vaccination: Flu shot s/p;  recommend COVID; recommend Pneumoani vacc after acute issues.   6 w- given dental extrac- on 1/26. :MM panel; K/l light chain ratio q 4 W  # DISPOSITION:  # De-access;  # PET scan ASAP # Bone marrow ASAP.  # follow up in 3 weeks- MD; labs- cbc/cmp; hold tube; vit D 25-OH- Dr.B

## 2022-09-24 NOTE — Telephone Encounter (Signed)
Patient aware that bmb scheduled for Tues 3/12 at 8:30 am with arrive at 7:30 am

## 2022-09-24 NOTE — Progress Notes (Signed)
Pt will complete IV antibiotics for infection in left knee. Management by infectious disease.

## 2022-09-24 NOTE — Progress Notes (Signed)
[facility] OFFICE PROGRESS NOTE  Patient Care Team: Marguerita Merles, MD as PCP - General (Family Medicine) End, Harrell Gave, MD as PCP - Cardiology (Cardiology) [name], [name] [name], MD as Consulting Physician (Hematology and Oncology) Ottie Glazier, MD as Consulting Physician (Pulmonary Disease) Gabriel Carina Betsey Holiday, MD as Consulting Physician (Endocrinology)   Cancer Staging  No matching staging information was found for the patient.  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 No morphologic support for residual myeloma disease. Negative for minimal residual disease by MM-MRD flow cytometry; HOLD REVLIMID [leg swelling]; AUG 2021-PET scan negative for myeloma; continue to hold Revlimid  # MARCH 2021-diastolic congestive heart failure [Dr.End]  # BMBx- OCT 2021- BMBx- 5% plasmacytosis-however this appears to be more polyclonal rather than monoclonal-not explain patient worsening anemia.   # OCT 2022- 18th- Dara- Rev-Dex  #November 2021-hyperthyroidism/goiter-  [D.Bennett/Dr.Solum]-methimazole. S/p Thyroidectomy [Dr.Kim; [facility] [id number]  --------------------------  # 12/12- RIGHT JUGULAR DVT-x 71mon xarelto; finished April 2020; September 2020-EGD/dysphagia; Dr. AVicente Males # Acute renal failure [Dr.Singh; Proteinuria 1.5gm/day ]; acyclovir/Asprin ------------------------------------------------------------------------------------------------------------   DIAGNOSIS: '[ ]'$  MULTIPLE MYELOMA  STAGE: III/HIGH RISK ;GOALS: CONTROL      Multiple myeloma not having achieved remission ([place]  11/21/2017 - 04/28/2018 Chemotherapy   Patient is on Treatment Plan : MYELOMA SALVAGE Cyclophosphamide / Carfilzomib / Dexamethasone (CCd) q28d     05/12/2021 - 02/25/2022 Chemotherapy   Patient is on Treatment Plan : MYELOMA RELAPSED REFRACTORY Daratumumab SQ + Lenalidomide + Dexamethasone (DaraRd) q28d     05/12/2021 -  Chemotherapy   Patient is on Treatment Plan : MYELOMA RELAPSED REFRACTORY Daratumumab SQ + Lenalidomide + Dexamethasone (DaraRd) q28d      INTERVAL HISTORY: Ambulating independently.  Alone.  Brandi [id number].  female pleasant patient above history of relapsed multiple myeloma--currently on daratumumab-Revlimid-dexamethasone  is here for follow-up.  In the interim patient was admitted to the hospital for left knee septic arthritis multiple streptococcal infection.  S/p evaluation with ID-on ceftriaxone until March 8th.    Pt reports she is not taking lasix regularly, reports hasn't taken in about a month because of increased urine frequency. Pt reports she is taking spironolactone '25mg'$  daily.   Pt temp is 101 today.  Denies any cough denies any chills.  Denies any nausea or vomiting.   Review of Systems  Constitutional:  Positive for malaise/fatigue. Negative for chills, diaphoresis and fever.  HENT:  Negative for nosebleeds and sore throat.   Eyes:  Negative for double vision.  Respiratory:  Negative for hemoptysis.   Cardiovascular:   Negative for chest pain, palpitations and orthopnea.  Gastrointestinal:  Negative for abdominal pain, blood in stool, constipation, heartburn and melena.  Genitourinary:  Negative for  dysuria, frequency and urgency.  Musculoskeletal:  Positive for back pain.  Skin: Negative.  Negative for itching and rash.  Neurological:  Negative for dizziness, tingling, focal weakness, weakness and headaches.  Endo/Heme/Allergies:  Does not bruise/bleed easily.  Psychiatric/Behavioral:  Negative for depression. The patient is not nervous/anxious.     PAST MEDICAL HISTORY :  Past Medical History:  Diagnosis Date   (HFpEF) heart failure with preserved ejection fraction ([place])    a. 06/2020 Echo: EF 60-65%, no rwma, nl RV size/fxn. Triv MR. Triv TR/AI. Mod elev PASP.   Anxiety    COPD (chronic obstructive pulmonary disease) (HCC)    GERD (gastroesophageal reflux disease)    Hypertension    Hyperthyroidism    Hypokalemia    IgA myeloma (HCC)    Multiple myeloma (HCC)    Pneumonia    Red blood cell antibody positive, compatible PRBC difficult to obtain    S/P autologous bone marrow transplantation ([place])     PAST SURGICAL HISTORY :   Past Surgical History:  Procedure Laterality Date   ANTERIOR VITRECTOMY Left 10/07/2021   Procedure: ANTERIOR VITRECTOMY;  Surgeon: Leandrew Koyanagi, MD;  Location: [facility];  Service: Ophthalmology;  Laterality: Left;   CATARACT EXTRACTION W/PHACO Left 10/07/2021   Procedure: CATARACT EXTRACTION PHACO AND INTRAOCULAR LENS PLACEMENT ([place]) LEFT VISION BLUE;  Surgeon: Leandrew Koyanagi, MD;  Location: [facility];  Service: Ophthalmology;  Laterality: Left;  kit for manual incision 14.56 01:21.4   CATARACT EXTRACTION W/PHACO Right 10/21/2021   Procedure: CATARACT EXTRACTION PHACO AND INTRAOCULAR LENS PLACEMENT ([facility]) RIGHT 11.12 01:48.1;  Surgeon: Leandrew Koyanagi, MD;  Location: [facility];  Service: Ophthalmology;  Laterality: Right;    ESOPHAGOGASTRODUODENOSCOPY (EGD) WITH PROPOFOL N/A 03/30/2019   Procedure: ESOPHAGOGASTRODUODENOSCOPY (EGD) WITH PROPOFOL;  Surgeon: Jonathon Bellows, MD;  Location: [facility] ENDOSCOPY;  Service: Gastroenterology;  Laterality: N/A;   IR FLUORO GUIDE PORT INSERTION RIGHT  12/16/2017   IR IMAGING GUIDED PORT INSERTION  06/15/2021   KNEE ARTHROSCOPY Left 09/03/2022   Procedure: ARTHROSCOPY KNEE DEBRIDEMENT;  Surgeon: Lovell Sheehan, MD;  Location: [facility];  Service: Orthopedics;  Laterality: Left;   THYROIDECTOMY N/A     FAMILY HISTORY :   Family History  Problem Relation Age of Onset   Pneumonia Mother    Seizures Father     SOCIAL HISTORY:   Social History   Tobacco Use   Smoking status: Former    Packs/day: 0.25    Types: Cigarettes    Quit date: 03/18/2021    Years since quitting: 1.5   Smokeless tobacco: Never   Tobacco comments:    2 cigarettes QOD  Vaping Use   Vaping Use: Never used  Substance Use Topics   Alcohol use: No   Drug use: No    ALLERGIES:  has No Known Allergies.  MEDICATIONS:  Current Outpatient Medications  Medication Sig Dispense Refill   acyclovir (ZOVIRAX) 400 MG tablet Take 1 tablet (400 mg total) by mouth 2 (two) times daily. 60 tablet 3   albuterol (VENTOLIN HFA) 108 (90 Base) MCG/ACT inhaler Inhale 2 puffs into the lungs every 6 (six) hours as needed for wheezing or shortness of breath. 8 g 3   aspirin EC 81 MG EC tablet Take 1 tablet (81 mg total) by mouth daily. 90 tablet 3   benzonatate (TESSALON) 100 MG capsule Take 1 capsule (100 mg total) by mouth 2 (two) times daily as needed for cough. 40 capsule 0   buPROPion [facility])  75 MG tablet Take 75 mg by mouth daily.     calcium-vitamin D (OSCAL WITH D) '500MG'$ -200UNIT (5MCG) tablet Take 1 tablet by mouth 2 (two) times daily. 60 tablet 0   carvedilol (COREG) 3.125 MG tablet Take 1 tablet (3.125 mg total) by mouth 2 (two) times daily with a meal. 60 tablet 5   cefTRIAXone (ROCEPHIN) IVPB Inject 2 g into  the vein daily for 22 days. Indication:  S. Pneumoniae septic arthritis of knee Last Day of Therapy: 10/01/2022 Labs - Once weekly:  CBC/D and CMP Fax weekly lab results  promptly to [phone] [id number] [facility] via Portacath Method of administration: IV Push Method of administration may be changed at the discretion of home infusion pharmacist based upon assessment of the patient and/or caregiver's ability to self-administer the medication ordered. 23 Units 0   furosemide (LASIX) 40 MG tablet Take 1 tablet (40 mg total) by mouth daily. 90 tablet 3   lenalidomide (REVLIMID) 15 MG capsule Take 1 capsule (15 mg total) by mouth daily. Take for 21 days, then hold for 7 days. Repeat every 28 days. 21 capsule 0   levothyroxine (SYNTHROID) 100 MCG tablet TAKE 1 TABLET BY MOUTH BEFORE BREAKFAST 90 tablet 0   lidocaine-prilocaine (EMLA) cream Apply 1 application topically as needed. Apply to port and cover with saran wrap 1-2 hours prior to port access 30 g 1   magnesium oxide (MAG-OX) 400 (240 Mg) MG tablet Take 1 tablet (400 mg total) by mouth 2 (two) times daily. 60 tablet 3   montelukast (SINGULAIR) 10 MG tablet Take 1 tablet (10 mg total) by mouth at bedtime. 30 tablet 3   ondansetron (ZOFRAN) 8 MG tablet One pill every 8 hours as needed for nausea/vomitting. 40 tablet 1   polyethylene glycol (MIRALAX / GLYCOLAX) packet Take 17 g by mouth daily as needed for mild constipation. 14 each 0   potassium chloride SA (KLOR-CON M) 20 MEQ tablet Take 1 tablet (20 mEq total) by mouth 2 (two) times daily. 60 tablet 6   spironolactone (ALDACTONE) 25 MG tablet Take 1 tablet by mouth once daily 90 tablet 3   traMADol (ULTRAM) 50 MG tablet TAKE 1 TABLET BY MOUTH EVERY 12 HOURS AS NEEDED 60 tablet 0   Vitamin D, Ergocalciferol, (DRISDOL) 1.25 MG (50000 UNIT) CAPS capsule Take 1 capsule by mouth once a week 12 capsule 0   [START ON 10/01/2022] amoxicillin (AMOXIL) 500 MG capsule Take 2 capsules (1,000 mg total) by mouth 3  (three) times daily. (Patient not taking: Reported on 09/24/2022) 42 capsule 1   pantoprazole (PROTONIX) 40 MG tablet Take 1 tablet (40 mg total) by mouth daily. 90 tablet 0   No current facility-administered medications for this visit.   Facility-Administered Medications Ordered in Other Visits  Medication Dose Route Frequency Provider Last Rate Last Admin   0.9 %  sodium chloride infusion   Intravenous Continuous Monia Sabal, PA-C       sodium chloride flush (NS) 0.9 % injection 10 mL  10 mL Intravenous PRN [name], MD   10 mL at 09/24/22 1004    PHYSICAL EXAMINATION:   BP 127/80 (BP Location: Left Arm, Patient Position: Sitting)   Pulse 65   Temp 97.8 F (36.6 C) (Tympanic)   Resp 16   Wt 173 lb (78.5 kg)   BMI 31.64 kg/m   Filed Weights   09/24/22 1000  Weight: 173 lb (78.5 kg)    Patient in wheelchair.  Accompanied by  daughter.  Left knee swelling/warmth and erythema noted.  Positive for tenderness.  Physical Exam HENT:     Head: Normocephalic and atraumatic.  Eyes:     Pupils: Pupils are equal, round, and reactive to light.  Cardiovascular:     Rate and Rhythm: Normal rate and regular rhythm.  Pulmonary:     Effort: Pulmonary effort is normal. No respiratory distress.     Breath sounds: Normal breath sounds. No wheezing.  Abdominal:     General: Bowel sounds are normal. There is no distension.     Palpations: Abdomen is soft. There is no mass.     Tenderness: There is no abdominal tenderness. There is no guarding or rebound.  Musculoskeletal:        General: No tenderness. Normal range of motion.     Cervical back: Normal range of motion and neck supple.  Skin:    General: Skin is warm.  Neurological:     Mental Status: She is alert and oriented to person, place, and time.  Psychiatric:        Mood and Affect: Affect normal.    LABORATORY DATA:  I have reviewed the data as listed    Component Value Date/Time   NA 138 09/24/2022 0957    NA 141 01/25/2020 1530   K 3.4 (L) 09/24/2022 0957   CL 102 09/24/2022 0957   CO2 23 09/24/2022 0957   GLUCOSE 127 (H) 09/24/2022 0957   BUN 20 09/24/2022 0957   BUN 19 01/25/2020 1530   CREATININE 0.92 09/24/2022 0957   CALCIUM 8.6 (L) 09/24/2022 0957   PROT 8.7 (H) 09/24/2022 0957   ALBUMIN 3.3 (L) 09/24/2022 0957   AST 18 09/24/2022 0957   ALT 9 09/24/2022 0957   ALKPHOS 88 09/24/2022 0957   BILITOT 0.3 09/24/2022 0957   GFRNONAA >60 09/24/2022 0957   GFRAA NOT CALCULATED 04/17/2020 1352    No results found for: "SPEP", "UPEP"  Lab Results  Component Value Date   WBC 7.3 09/24/2022   NEUTROABS 3.2 09/24/2022   HGB 8.4 (L) 09/24/2022   HCT 26.7 (L) 09/24/2022   MCV 95.7 09/24/2022   PLT 236 09/24/2022      Chemistry      Component Value Date/Time   NA 138 09/24/2022 0957   NA 141 01/25/2020 1530   K 3.4 (L) 09/24/2022 0957   CL 102 09/24/2022 0957   CO2 23 09/24/2022 0957   BUN 20 09/24/2022 0957   BUN 19 01/25/2020 1530   CREATININE 0.92 09/24/2022 0957      Component Value Date/Time   CALCIUM 8.6 (L) 09/24/2022 0957   ALKPHOS 88 09/24/2022 0957   AST 18 09/24/2022 0957   ALT 9 09/24/2022 0957   BILITOT 0.3 09/24/2022 0957      (H): Data is abnormally high   Latest Reference Range & Units Most Recent 12/24/20 14:01 04/01/21 14:34 04/28/21 09:17 06/11/21 09:42 07/03/21 10:32 08/20/21 10:06  M Protein SerPl Elph-Mcnc Not Observed g/dL 1.1 (H) (C) 08/20/21 10:06 Not Observed (C) 0.6 (H) (C) 0.7 (H) (C) 0.5 (H) (C) 0.7 (H) (C) 1.1 (H) (C)    Latest Reference Range & Units Most Recent 09/17/21 08:47 10/15/21 08:25 11/12/21 08:55  Kappa free light chain 3.3 - 19.4 mg/L 5.0 11/12/21 08:55 7.2 7.2 5.0  Lambda free light chains 5.7 - 26.3 mg/L 223.6 (H) 11/12/21 08:55 215.3 (H) 340.2 (H) 223.6 (H)  Kappa, lambda light chain ratio 0.26 - 1.65  0.02 (L) 11/12/21  08:55 0.03 (L) 0.02 (L) 0.02 (L)  (H): Data is abnormally high (L): Data is abnormally  low  RADIOGRAPHIC STUDIES: I have personally reviewed the radiological images as listed and agreed with the findings in the report. No results found.   ASSESSMENT & PLAN:  Multiple myeloma not having achieved remission ([place]) # RECURRENT Multiple myeloma stage III [high-risk cytogenetics-status post KRD-VGPR ; status post autologous stem cell transplant on 06/15/18.    # Currently on salvage therapy with daratumumab  lenalidomide [15 mg 3 w; 1-w] dexamethasone.  FEB 2024-lambda light chain 284 M protein 0.9-rising lambda light chain ratio. Discontinue Revlimid; and dara.   # I had a long discussion discussed with patient and daughter that patient's multiple myeloma labs getting worse concerning for progression/refractory disease.  Discussed that patient needs repeat bone marrow biopsy; imaging with PET scan.  # Left knee septic arthritis/immunocompromised host- [s/p Dr.Bowers; ID]; currently-S/p evaluation with ID-on ceftriaxone until March 8th. And then x 2 weeks.   # Iatrogenic hypothyroidism [ Graves/goiter s/p total thyroidectomy;aug,2022 ]-currently on Synthroid 100 mcg/day-September 2023 TSH normal.  Monitor closely.  # Worsening lower back discomfort x 6 months-see above.   # Muscle cramps-?  Revlimid. improved  On tramadol q 12 hours prn-Refill tramadol.await above workup  # Chronic diastolic CHF- stable.   # Hypokalemia- K 3.4 recommend compliance with Kdur BID.  Stable  # Bone lesions /last zometa on 11/27/2017.  Holding Zometa secondary to poor dentition.  Dental extraction appointment appt-on Jan 26th.   # Insomnia- recommend restart melatonin 1 to 2 mg nightly as needed.   Stable  # IV access: mediport- functional  # Vaccination: Flu shot s/p;  recommend COVID; recommend Pneumoani vacc after acute issues.   6 w- given dental extrac- on 1/26. :MM panel; K/l light chain ratio q 4 W  # DISPOSITION:  # PET scan  # Bone marrow ASAP.  #  follow up in 2-3 weeks- MD; labs-  cbc/cmp- Dr.B     Orders Placed This Encounter  Procedures   NM PET Image Restage (PS) Whole Body (F-18 FDG)    Standing Status:   Future    Standing Expiration Date:   09/24/2023    Order Specific Question:   If indicated for the ordered procedure, I authorize the administration of a radiopharmaceutical per Radiology protocol    Answer:   Yes    Order Specific Question:   Preferred imaging location?    Answer:   [facility]   CT BONE MARROW BIOPSY & ASPIRATION    Standing Status:   Future    Standing Expiration Date:   09/24/2023    Order Specific Question:   Reason for Exam (SYMPTOM  OR DIAGNOSIS REQUIRED)    Answer:   multiple myeloma recurrence    Order Specific Question:   Preferred imaging location?    Answer:   [facility]    Order Specific Question:   Radiology Contrast Protocol - do NOT remove file path    Answer:   [email or web address]   CBC with Differential (Cancer Center Only)    Standing Status:   Future    Standing Expiration Date:   09/24/2023   CMP ([facility] only)    Standing Status:   Future    Standing Expiration Date:   09/24/2023   VITAMIN D 25 Hydroxy (Vit-D Deficiency, Fractures)    Standing Status:   Future    Standing Expiration Date:   09/24/2023   Sample to Blood  Bank    Standing Status:   Future    Standing Expiration Date:   09/24/2023     All questions were answered. The patient knows to call the clinic with any problems, questions or concerns.      [name], MD 09/24/2022 11:19 AM

## 2022-09-24 NOTE — Progress Notes (Signed)
Patient had recent knee surgery and on IV antibiotics for infection.  Will be finished with IV abx until 10/01/22 then will take oral abx for 2 weeks.  New right hand neuropathy and funny feeling on bottom of right foot.

## 2022-09-25 ENCOUNTER — Other Ambulatory Visit: Payer: Self-pay

## 2022-09-30 ENCOUNTER — Other Ambulatory Visit: Payer: Self-pay

## 2022-09-30 ENCOUNTER — Ambulatory Visit
Admission: RE | Admit: 2022-09-30 | Discharge: 2022-09-30 | Disposition: A | Discharge status: Home / Self Care | Payer: 59 | Source: Ambulatory Visit | Attending: Internal Medicine | Admitting: Internal Medicine

## 2022-09-30 ENCOUNTER — Emergency Department
Admission: EM | Admit: 2022-09-30 | Discharge: 2022-09-30 | Disposition: A | Discharge status: Home / Self Care | Payer: 59 | Attending: Emergency Medicine | Admitting: Emergency Medicine

## 2022-09-30 DIAGNOSIS — Z452 Encounter for adjustment and management of vascular access device: Secondary | ICD-10-CM | POA: Insufficient documentation

## 2022-09-30 DIAGNOSIS — I10 Essential (primary) hypertension: Secondary | ICD-10-CM | POA: Diagnosis not present

## 2022-09-30 DIAGNOSIS — C9000 Multiple myeloma not having achieved remission: Secondary | ICD-10-CM

## 2022-09-30 DIAGNOSIS — J449 Chronic obstructive pulmonary disease, unspecified: Secondary | ICD-10-CM | POA: Diagnosis not present

## 2022-09-30 DIAGNOSIS — R9089 Other abnormal findings on diagnostic imaging of central nervous system: Secondary | ICD-10-CM | POA: Insufficient documentation

## 2022-09-30 DIAGNOSIS — Z789 Other specified health status: Secondary | ICD-10-CM

## 2022-09-30 DIAGNOSIS — Z20822 Contact with and (suspected) exposure to covid-19: Secondary | ICD-10-CM | POA: Diagnosis not present

## 2022-09-30 DIAGNOSIS — E039 Hypothyroidism, unspecified: Secondary | ICD-10-CM | POA: Diagnosis not present

## 2022-09-30 LAB — RESP PANEL BY RT-PCR (RSV, FLU A&B, COVID)  RVPGX2
Influenza A by PCR: NEGATIVE
Influenza B by PCR: NEGATIVE
Resp Syncytial Virus by PCR: NEGATIVE
SARS Coronavirus 2 by RT PCR: NEGATIVE

## 2022-09-30 LAB — GLUCOSE, CAPILLARY: Glucose-Capillary: 92 mg/dL (ref 70–99)

## 2022-09-30 MED ORDER — FLUDEOXYGLUCOSE F - 18 (FDG) INJECTION
9.4200 | Freq: Once | INTRAVENOUS | Status: AC | PRN
  Administered 2022-09-30: 9.42 via INTRAVENOUS

## 2022-09-30 NOTE — ED Notes (Signed)
Pt to ED with bleeding at accessed port site today following antibiotic administration over the last 3 weeks. Pt reports pain at the site x 1 week

## 2022-09-30 NOTE — ED Triage Notes (Addendum)
Pt to ED via POV from home. Pt reports she noticed bleeding and stinging at port site about 32mns PTA. Pt is currently on chemo tx.Pt denies CP, SOB or swelling.  Pt also requesting COVID test due to possible exposure.

## 2022-09-30 NOTE — ED Provider Notes (Signed)
[facility] Emergency Department Provider Note     Event Date/Time   First MD Initiated Contact with Patient 09/30/22 1959     (approximate)   History   Vascular Access Problem   HPI  Brandi [name] is a 72 y.o. female with a history of multiple myeloma, hypothyroidism, hypertension, COPD, current chemotherapy, presents to the ED after noticing some mild bleeding and stinging at her port site.  Patient denies any fevers, chest pain, shortness of breath, or swelling.  Patient also has a secondary request for COVID testing secondary to possible exposure.  Physical Exam   Triage Vital Signs: ED Triage Vitals  Enc Vitals Group     BP 09/30/22 1814 135/66     Pulse Rate 09/30/22 1814 71     Resp 09/30/22 1814 18     Temp 09/30/22 1814 98.8 F (37.1 C)     Temp Source 09/30/22 1814 Oral     SpO2 09/30/22 1814 99 %     Weight --      Height --      Head Circumference --      Peak Flow --      Pain Score 09/30/22 1818 0     Pain Loc --      Pain Edu? --      Excl. in [place]? --     Most recent vital signs: Vitals:   09/30/22 1814  BP: 135/66  Pulse: 71  Resp: 18  Temp: 98.8 F (37.1 C)  SpO2: 99%    General Awake, no distress. NAD HEENT NCAT. PERRL. EOMI. No rhinorrhea. Mucous membranes are moist.  CV:  Good peripheral perfusion.  RESP:  Normal effort.  ABD:  No distention.  SKIN  chemotherapy port noted to the upper right anterior chest, which has been evaluated and changed by the IV team nurse, at the time of assessment.  No active bleeding at this time.   ED Results / Procedures / Treatments   Labs (all labs ordered are listed, but only abnormal results are displayed) Labs Reviewed  RESP PANEL BY RT-PCR (RSV, FLU A&B, COVID)  [id number]     EKG   RADIOLOGY   No results found.   PROCEDURES:  Critical Care performed: No  Procedures   MEDICATIONS ORDERED IN ED: Medications - No data to display   IMPRESSION / MDM /  [place] / ED COURSE  I reviewed the triage vital signs and the nursing notes.                              Differential diagnosis includes, but is not limited to, vascular access problem, port clot, port malfunction, port dislodgment  Patient's presentation is most consistent with acute, uncomplicated illness.  Patient's diagnosis is consistent with local cord access malfunction.  IV team was consulted after the patient arrived in the ED, and they have changed out the port without difficulty.  Normal normal function has been reported.  Patient is to follow up with her primary provider as needed or otherwise directed. Patient is given ED precautions to return to the ED for any worsening or new symptoms.  FINAL CLINICAL IMPRESSION(S) / ED DIAGNOSES   Final diagnoses:  Problem with vascular access     Rx / DC Orders   ED Discharge Orders     None        Note:  This document was  prepared using Systems analyst and may include unintentional dictation errors.    Melvenia Needles, PA-C 09/30/22 2303    Nance Pear, MD 09/30/22 [phone]

## 2022-09-30 NOTE — Discharge Instructions (Addendum)
Follow-up with your primary provider as needed.

## 2022-10-04 ENCOUNTER — Other Ambulatory Visit (HOSPITAL_COMMUNITY): Payer: Self-pay | Admitting: Student

## 2022-10-04 DIAGNOSIS — C9000 Multiple myeloma not having achieved remission: Secondary | ICD-10-CM

## 2022-10-04 NOTE — H&P (Signed)
Chief Complaint: Patient was seen in consultation today for multiple myeloma recurrence at the request of [name],[name]  Referring Physician(s): [name]  Supervising Physician: Ruthann Cancer  Patient Status: [place] - Out-pt  History of Present Illness: Brandi [name] is a 72 y.o. female treated and followed by oncology for multiple myeloma. She was initially diagnosed September 2018  and received treatment. 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 Multiple myeloma labs have been increasing with concern for progression/refractory disease. Pt has been referred to IR for bone marrow biopsy.   Pt denies fever, chills, CP, loss of appetite, weakness or fatigue.  She endorses intermittent SOB and leg swelling.  She is NPO per order.   Past Medical History:  Diagnosis Date   (HFpEF) heart failure with preserved ejection fraction ([place])    a. 06/2020 Echo: EF 60-65%, no rwma, nl RV size/fxn. Triv MR. Triv TR/AI. Mod elev PASP.   Anxiety    COPD (chronic obstructive pulmonary disease) (HCC)    GERD (gastroesophageal reflux disease)    Hypertension    Hyperthyroidism    Hypokalemia    IgA myeloma (HCC)    Multiple myeloma (HCC)    Pneumonia    Red blood cell antibody positive, compatible PRBC difficult to obtain    S/P autologous bone marrow transplantation [facility])     Past Surgical History:  Procedure Laterality Date   ANTERIOR VITRECTOMY Left 10/07/2021   Procedure: ANTERIOR VITRECTOMY;  Surgeon: Leandrew Koyanagi, MD;  Location: [place];  Service: Ophthalmology;  Laterality: Left;   CATARACT EXTRACTION W/PHACO Left 10/07/2021   Procedure: CATARACT EXTRACTION PHACO AND INTRAOCULAR LENS PLACEMENT ([place]) LEFT VISION BLUE;  Surgeon: Leandrew Koyanagi, MD;  Location: [place];  Service: Ophthalmology;  Laterality: Left;  kit for manual incision 14.56 01:21.4   CATARACT EXTRACTION W/PHACO Right 10/21/2021    Procedure: CATARACT EXTRACTION PHACO AND INTRAOCULAR LENS PLACEMENT ([facility]) RIGHT 11.12 01:48.1;  Surgeon: Leandrew Koyanagi, MD;  Location: [place];  Service: Ophthalmology;  Laterality: Right;   ESOPHAGOGASTRODUODENOSCOPY (EGD) WITH PROPOFOL N/A 03/30/2019   Procedure: ESOPHAGOGASTRODUODENOSCOPY (EGD) WITH PROPOFOL;  Surgeon: Jonathon Bellows, MD;  Location: [facility] ENDOSCOPY;  Service: Gastroenterology;  Laterality: N/A;   IR FLUORO GUIDE PORT INSERTION RIGHT  12/16/2017   IR IMAGING GUIDED PORT INSERTION  06/15/2021   KNEE ARTHROSCOPY Left 09/03/2022   Procedure: ARTHROSCOPY KNEE DEBRIDEMENT;  Surgeon: Lovell Sheehan, MD;  Location: [facility]S;  Service: Orthopedics;  Laterality: Left;   THYROIDECTOMY N/A     Allergies: Patient has no known allergies.  Medications: Prior to Admission medications   Medication Sig Start Date End Date Taking? Authorizing Provider  acyclovir (ZOVIRAX) 400 MG tablet Take 1 tablet (400 mg total) by mouth 2 (two) times daily. 04/28/21   [name], MD  albuterol (VENTOLIN HFA) 108 (90 Base) MCG/ACT inhaler Inhale 2 puffs into the lungs every 6 (six) hours as needed for wheezing or shortness of breath. 02/25/22   Hughie Closs, PA-C  amoxicillin (AMOXIL) 500 MG capsule Take 2 capsules (1,000 mg total) by mouth 3 (three) times daily. Patient not taking: Reported on 09/24/2022 10/01/22   Tsosie Billing, MD  aspirin EC 81 MG EC tablet Take 1 tablet (81 mg total) by mouth daily. 01/25/20   Loel Dubonnet, NP  benzonatate (TESSALON) 100 MG capsule Take 1 capsule (100 mg total) by mouth 2 (two) times daily as needed for cough. 06/25/22   [name], MD  buPROPion (WELLBUTRIN) 75 MG tablet Take 75 mg by mouth daily. 03/24/21   [provider]  calcium-vitamin D (OSCAL WITH D) '500MG'$ -200UNIT (5MCG) tablet Take 1 tablet by mouth 2 (two) times daily. 05/18/21   Borders, Kirt Boys, NP  carvedilol (COREG) 3.125 MG tablet Take 1 tablet  (3.125 mg total) by mouth 2 (two) times daily with a meal. 11/27/21   Furth, Cadence H, PA-C  furosemide (LASIX) 40 MG tablet Take 1 tablet (40 mg total) by mouth daily. 10/02/21   Furth, Cadence H, PA-C  lenalidomide (REVLIMID) 15 MG capsule Take 1 capsule (15 mg total) by mouth daily. Take for 21 days, then hold for 7 days. Repeat every 28 days. 08/13/22   [name], MD  levothyroxine (SYNTHROID) 100 MCG tablet TAKE 1 TABLET BY MOUTH BEFORE BREAKFAST 05/11/22   [name], MD  lidocaine-prilocaine (EMLA) cream Apply 1 application topically as needed. Apply to port and cover with saran wrap 1-2 hours prior to port access 07/03/21   [name], MD  magnesium oxide (MAG-OX) 400 (240 Mg) MG tablet Take 1 tablet (400 mg total) by mouth 2 (two) times daily. 09/17/21   [name], MD  montelukast (SINGULAIR) 10 MG tablet Take 1 tablet (10 mg total) by mouth at bedtime. 04/22/22   [name], MD  ondansetron (ZOFRAN) 8 MG tablet One pill every 8 hours as needed for nausea/vomitting. 04/28/21   [name], MD  pantoprazole (PROTONIX) 40 MG tablet Take 1 tablet (40 mg total) by mouth daily. 02/25/22 09/03/22  Hughie Closs, PA-C  polyethylene glycol (MIRALAX / Floria Raveling) packet Take 17 g by mouth daily as needed for mild constipation. 04/11/17   Gouru, Illene Silver, MD  potassium chloride SA (KLOR-CON M) 20 MEQ tablet Take 1 tablet (20 mEq total) by mouth 2 (two) times daily. 02/25/22   Hughie Closs, PA-C  spironolactone (ALDACTONE) 25 MG tablet Take 1 tablet by mouth once daily 04/14/22   End, Harrell Gave, MD  traMADol (ULTRAM) 50 MG tablet TAKE 1 TABLET BY MOUTH EVERY 12 HOURS AS NEEDED 08/27/22   [name], MD  Vitamin D, Ergocalciferol, (DRISDOL) 1.25 MG (50000 UNIT) CAPS capsule Take 1 capsule by mouth once a week 07/12/22   Hughie Closs, PA-C     Family History  Problem Relation Age of Onset   Pneumonia Mother    Seizures Father      Social History   Socioeconomic History   Marital status: Single    Spouse name: Not on file   Number of children: 3   Years of education: Not on file   Highest education level: Not on file  Occupational History   Not on file  Tobacco Use   Smoking status: Former    Packs/day: 0.25    Types: Cigarettes    Quit date: 03/18/2021    Years since quitting: 1.5   Smokeless tobacco: Never   Tobacco comments:    2 cigarettes QOD  Vaping Use   Vaping Use: Never used  Substance and Sexual Activity   Alcohol use: No   Drug use: No   Sexual activity: Not Currently  Other Topics Concern   Not on file  Social History Narrative   Lives at home with family(son): daughter 8 blocks away. States "I'm never alone." Independent at baseline   Social Determinants of Health   Financial Resource Strain: Not on file  Food Insecurity: No Food Insecurity (09/04/2022)   Hunger  Vital Sign    Worried About Charity fundraiser in the Last Year: Never true    Ran Out of Food in the Last Year: Never true  Transportation Needs: No Transportation Needs (09/04/2022)   PRAPARE - Hydrologist (Medical): No    Lack of Transportation (Non-Medical): No  Physical Activity: Not on file  Stress: Not on file  Social Connections: Not on file   Review of Systems: A 12 point ROS discussed and pertinent positives are indicated in the HPI above.  All other systems are negative.  Review of Systems  Constitutional:  Negative for appetite change, chills, fatigue and fever.  Respiratory:  Positive for shortness of breath.   Cardiovascular:  Positive for leg swelling. Negative for chest pain.  Gastrointestinal:  Positive for vomiting. Negative for abdominal pain and nausea.  Neurological:  Negative for dizziness, weakness and headaches.    Vital Signs: BP (!) 142/83   Pulse 71   Temp 98.7 F (37.1 C) (Oral)   Resp 20   Ht '5\' 2"'$  (1.575 m)   Wt 170 lb (77.1 kg)   SpO2 98%   BMI  31.09 kg/m     Physical Exam Vitals reviewed.  Constitutional:      General: She is not in acute distress.    Appearance: Normal appearance. She is not ill-appearing.  HENT:     Head: Normocephalic and atraumatic.     Mouth/Throat:     Mouth: Mucous membranes are moist.  Eyes:     Extraocular Movements: Extraocular movements intact.     Pupils: Pupils are equal, round, and reactive to light.  Cardiovascular:     Rate and Rhythm: Normal rate and regular rhythm.     Pulses: Normal pulses.     Heart sounds: Normal heart sounds. No murmur heard. Pulmonary:     Effort: Pulmonary effort is normal. No respiratory distress.     Breath sounds: Normal breath sounds.  Abdominal:     General: Bowel sounds are normal. There is no distension.     Palpations: Abdomen is soft.     Tenderness: There is no abdominal tenderness. There is no guarding.  Musculoskeletal:     Right lower leg: No edema.     Left lower leg: No edema.  Skin:    General: Skin is warm and dry.  Neurological:     Mental Status: She is alert and oriented to person, place, and time.  Psychiatric:        Mood and Affect: Mood normal.        Behavior: Behavior normal.        Thought Content: Thought content normal.        Judgment: Judgment normal.     Imaging: NM PET Image Restage (PS) Whole Body (F-18 FDG)  Result Date: 10/02/2022 CLINICAL DATA:  Subsequent treatment strategy for multiple myeloma. EXAM: NUCLEAR MEDICINE PET WHOLE BODY TECHNIQUE: PET-CT 03/17/2020. MCi F-18 FDG was injected intravenously. Full-ring PET imaging was performed from the head to foot after the radiotracer. CT data was obtained and used for attenuation correction and anatomic localization. Fasting blood glucose: 92 mg/dl COMPARISON:  PET-CT 03/17/2020. FINDINGS: Mediastinal blood pool activity: SUV max 2.6 HEAD/NECK: Mild hypermetabolism in the region of the cervical spinal cord (SUVmax = 3.9) . no hypermetabolic activity in the scalp. No  hypermetabolic cervical lymph nodes. Incidental CT findings: none CHEST: No hypermetabolic mediastinal or hilar nodes. No suspicious pulmonary nodules on the CT  scan. Incidental CT findings: Right internal jugular single-lumen Port-A-Cath with tip terminating in the distal superior vena cava. Atherosclerotic calcifications in the thoracic aorta as well as the left main, left anterior descending, left circumflex and right coronary arteries. Extensive scarring or atelectasis in the left lower lobe. ABDOMEN/PELVIS: Mild hypermetabolism (SUVmax = 5.4) in the distal spinal cord, likely near the level of the conus medullaris. No abnormal hypermetabolic activity within the liver, pancreas, adrenal glands, or spleen. No hypermetabolic lymph nodes in the abdomen or pelvis. Incidental CT findings: Atherosclerosis throughout the abdominal aorta and pelvic vasculature. 1.4 cm calcified lesion in the posterior aspect of the uterine body, likely a calcified fibroid. SKELETON: No focal hypermetabolic activity to suggest myelomatous lesions in the skeleton. Incidental CT findings: none EXTREMITIES: No abnormal hypermetabolic activity in the bones of the lower extremities.Hypermetabolism associated with the left knee joint. Incidental CT findings: none IMPRESSION: 1. No hypermetabolic myelomatous lesions in the skeleton. 2. Areas of low-level hypermetabolism associated with the cervical spinal cord and the lower spinal cord likely at the level of the conus medullaris. This is of uncertain etiology and significance, and unlikely to be related to underlying multiple myeloma. However, further evaluation is recommended. As the hypermetabolism appears more pronounced in the region of the conus medullaris, initial imaging of the lumbar spine with and without IV gadolinium is recommended at this time for further evaluation. 3. Hypermetabolism associated with the left knee joint, presumably reflective of arthritis. 4. Additional incidental  findings, as above. Electronically Signed   By: Vinnie Langton M.D.   On: 10/02/2022 09:58   ECHOCARDIOGRAM COMPLETE  Result Date: 09/07/2022    ECHOCARDIOGRAM REPORT   Patient Name:   Brandi [name] Date of Exam: 09/07/2022 Medical Rec #:  [id number]         Height:       62.0 in Accession #:    [id number]        Weight:       171.1 lb Date of Birth:  [birth year]         BSA:          1.789 m Patient Age:    69 years          BP:           130/78 mmHg Patient Gender: F                 HR:           69 bpm. Exam Location:  [facility] Procedure: 2D Echo, Cardiac Doppler and Color Doppler Indications:     Endodarditis  History:         Patient has prior history of Echocardiogram examinations, most                  recent 08/20/2019. COPD; Risk Factors:Hypertension.                  Endocarditis.  Sonographer:     Wenda Low Referring Phys:  [id number] Tsosie Billing Diagnosing Phys: Nelva Bush MD IMPRESSIONS  1. Left ventricular ejection fraction, by estimation, is 50 to 55%. The left ventricle has low normal function. The left ventricle has no regional wall motion abnormalities. Left ventricular diastolic parameters were normal.  2. Right ventricular systolic function is normal. The right ventricular size is mildly enlarged.  3. The mitral valve is grossly normal. Mild mitral valve regurgitation. No evidence of mitral stenosis.  4. Tricuspid valve regurgitation is mild to moderate.  5. The aortic valve was not well visualized. There is mild thickening of the aortic valve. Aortic valve regurgitation is not visualized. Aortic valve sclerosis is present, with no evidence of aortic valve stenosis. Conclusion(s)/Recommendation(s): There is no definite evidence of endocarditis, though native valvular disease is present. If clinical concern persists, transesophageal echocardiogram should be considered. FINDINGS  Left Ventricle: Left ventricular ejection fraction, by estimation, is 50 to 55%. The left ventricle  has low normal function. The left ventricle has no regional wall motion abnormalities. The left ventricular internal cavity size was normal in size. There is no left ventricular hypertrophy. Left ventricular diastolic parameters were normal. Right Ventricle: The right ventricular size is mildly enlarged. No increase in right ventricular wall thickness. Right ventricular systolic function is normal. Left Atrium: Left atrial size was normal in size. Right Atrium: Right atrial size was normal in size. Pericardium: There is no evidence of pericardial effusion. Mitral Valve: The mitral valve is grossly normal. Mild mitral valve regurgitation. No evidence of mitral valve stenosis. MV peak gradient, 4.0 mmHg. The mean mitral valve gradient is 1.0 mmHg. Tricuspid Valve: The tricuspid valve is not well visualized. Tricuspid valve regurgitation is mild to moderate. Aortic Valve: The aortic valve was not well visualized. There is mild thickening of the aortic valve. Aortic valve regurgitation is not visualized. Aortic valve sclerosis is present, with no evidence of aortic valve stenosis. Aortic valve mean gradient measures 3.0 mmHg. Aortic valve peak gradient measures 6.1 mmHg. Aortic valve area, by VTI measures 2.66 cm. Pulmonic Valve: The pulmonic valve was normal in structure. Pulmonic valve regurgitation is mild. No evidence of pulmonic stenosis. Aorta: The aortic root is normal in size and structure. Pulmonary Artery: The pulmonary artery is not well seen. Venous: The inferior vena cava was not well visualized. IAS/Shunts: No atrial level shunt detected by color flow Doppler.  LEFT VENTRICLE PLAX 2D LVIDd:         4.80 cm     Diastology LVIDs:         3.00 cm     LV e' medial:    12.10 cm/s LV PW:         0.90 cm     LV E/e' medial:  5.8 LV IVS:        1.00 cm     LV e' lateral:   10.60 cm/s LVOT diam:     2.00 cm     LV E/e' lateral: 6.6 LV SV:         68 LV SV Index:   38 LVOT Area:     3.14 cm  LV Volumes (MOD) LV  vol d, MOD A2C: 79.5 ml LV vol d, MOD A4C: 68.2 ml LV vol s, MOD A2C: 38.6 ml LV vol s, MOD A4C: 36.6 ml LV SV MOD A2C:     40.8 ml LV SV MOD A4C:     68.2 ml LV SV MOD BP:      39.9 ml RIGHT VENTRICLE RV Basal diam:  4.40 cm RV Mid diam:    3.00 cm RV S prime:     12.80 cm/s TAPSE (M-mode): 2.2 cm LEFT ATRIUM             Index        RIGHT ATRIUM           Index LA diam:        3.30 cm 1.84 cm/m   RA Area:     17.40 cm LA Vol (  A2C):   48.1 ml 26.89 ml/m  RA Volume:   45.30 ml  25.32 ml/m LA Vol (A4C):   40.1 ml 22.42 ml/m LA Biplane Vol: 44.9 ml 25.10 ml/m  AORTIC VALVE                    PULMONIC VALVE AV Area (Vmax):    2.78 cm     PV Vmax:       0.82 m/s AV Area (Vmean):   2.19 cm     PV Peak grad:  2.7 mmHg AV Area (VTI):     2.66 cm AV Vmax:           123.00 cm/s AV Vmean:          85.700 cm/s AV VTI:            0.255 m AV Peak Grad:      6.1 mmHg AV Mean Grad:      3.0 mmHg LVOT Vmax:         109.00 cm/s LVOT Vmean:        59.700 cm/s LVOT VTI:          0.216 m LVOT/AV VTI ratio: 0.85  AORTA Ao Root diam: 3.30 cm MITRAL VALVE               TRICUSPID VALVE MV Area (PHT): 3.99 cm    TR Peak grad:   27.0 mmHg MV Area VTI:   2.29 cm    TR Vmax:        260.00 cm/s MV Peak grad:  4.0 mmHg MV Mean grad:  1.0 mmHg    SHUNTS MV Vmax:       0.99 m/s    Systemic VTI:  0.22 m MV Vmean:      54.1 cm/s   Systemic Diam: 2.00 cm MV Decel Time: 190 msec MV E velocity: 69.90 cm/s MV A velocity: 79.80 cm/s MV E/A ratio:  0.88 Harrell Gave End MD Electronically signed by Nelva Bush MD Signature Date/Time: 09/07/2022/6:56:35 PM    Final    DG Chest 2 View  Result Date: 09/06/2022 CLINICAL DATA:  Pneumococcus infection. EXAM: CHEST - 2 VIEW COMPARISON:  Chest radiograph Dec 07, 2021. FINDINGS: Accessed right chest Port-A-Cath with tip at the superior cavoatrial junction. The heart size and mediastinal contours are within normal limits. Aortic atherosclerosis. Similar chronic peribronchial thickening. Low lung  volumes with bibasilar atelectasis. No pleural effusion. No pneumothorax. No acute osseous abnormality. IMPRESSION: 1. Low lung volumes with bibasilar atelectasis. 2. Similar chronic peribronchial thickening. Electronically Signed   By: Dahlia Bailiff M.D.   On: 09/06/2022 17:01    Labs:  CBC: Recent Labs    09/04/22 0604 09/05/22 0341 09/24/22 0957 10/05/22 0811  WBC 7.6 5.6 7.3 6.2  HGB 8.4* 8.5* 8.4* 8.0*  HCT 26.5* 27.0* 26.7* 25.3*  PLT 142* 124* 236 178    COAGS: No results for input(s): "INR", "APTT" in the last 8760 hours.  BMP: Recent Labs    09/04/22 0604 09/05/22 0341 09/06/22 0504 09/24/22 0957  NA 135 135 135 138  K 3.6 3.0* 3.9 3.4*  CL 105 106 106 102  CO2 21* 22 20* 23  GLUCOSE 105* 105* 99 127*  BUN '8 10 9 20  '$ CALCIUM 7.7* 8.2* 8.2* 8.6*  CREATININE 0.84 0.93 0.76 0.92  GFRNONAA >60 >60 >60 >60    LIVER FUNCTION TESTS: Recent Labs    06/25/22 0937 07/23/22 1021 09/03/22 0947 09/24/22 0957  BILITOT 0.7 0.5 0.7 0.3  AST '20 19 21 18  '$ ALT '14 13 20 9  '$ ALKPHOS 68 63 92 88  PROT 7.3 8.2* 8.8* 8.7*  ALBUMIN 3.8 3.9 3.5 3.3*    TUMOR MARKERS: No results for input(s): "AFPTM", "CEA", "CA199", "CHROMGRNA" in the last 8760 hours.  Assessment and Plan:  72 yo female with PMHx significant for anxiety, COPD, HTN, multiple myeloma s/p autologous bone marrow transplantation 2019 presents to IR for bone marrow biopsy with concern for disease recurrence.   Pt resting in bed with family member at bedside.  She is A&O, calm and pleasant.  She is in no distress.  Risks and benefits of bone marrow biopsy and aspiration with moderate sedation was discussed with the patient and/or patient's family including, but not limited to bleeding, infection, damage to adjacent structures or low yield requiring additional tests.  All of the questions were answered and there is agreement to proceed.  Consent signed and in chart.  Thank you for this interesting  consult.  I greatly enjoyed meeting Brandi [name] and look forward to participating in their care.  A copy of this report was sent to the requesting provider on this date.  Electronically Signed: Tyson Alias, NP 10/05/2022, 8:29 AM   I spent a total of 20 minutes in face to face in clinical consultation, greater than 50% of which was counseling/coordinating care for multiple myeloma recurrence.

## 2022-10-04 NOTE — Progress Notes (Signed)
Patient for CT Bone Marrow Biopsy on Tues 10/05/2022, I called and spoke with the patient on the phone and gave pre-procedure instructions. Pt was made aware to be here at 7:30a, NPO after MN prior to procedure as well as driver post procedure/recovery/discharge. Pt stated understanding.  Called 10/04/2022

## 2022-10-05 ENCOUNTER — Ambulatory Visit
Admission: RE | Admit: 2022-10-05 | Discharge: 2022-10-05 | Disposition: A | Discharge status: Home / Self Care | Payer: 59 | Source: Ambulatory Visit | Attending: Internal Medicine | Admitting: Internal Medicine

## 2022-10-05 ENCOUNTER — Other Ambulatory Visit: Payer: Self-pay

## 2022-10-05 DIAGNOSIS — C9000 Multiple myeloma not having achieved remission: Secondary | ICD-10-CM | POA: Diagnosis present

## 2022-10-05 DIAGNOSIS — D469 Myelodysplastic syndrome, unspecified: Secondary | ICD-10-CM | POA: Insufficient documentation

## 2022-10-05 LAB — CBC WITH DIFFERENTIAL/PLATELET
Abs Immature Granulocytes: 0.01 10*3/uL (ref 0.00–0.07)
Basophils Absolute: 0 10*3/uL (ref 0.0–0.1)
Basophils Relative: 0 %
Eosinophils Absolute: 0.1 10*3/uL (ref 0.0–0.5)
Eosinophils Relative: 2 %
HCT: 25.3 % — ABNORMAL LOW (ref 36.0–46.0)
Hemoglobin: 8 g/dL — ABNORMAL LOW (ref 12.0–15.0)
Immature Granulocytes: 0 %
Lymphocytes Relative: 58 %
Lymphs Abs: 3.6 10*3/uL (ref 0.7–4.0)
MCH: 30.1 pg (ref 26.0–34.0)
MCHC: 31.6 g/dL (ref 30.0–36.0)
MCV: 95.1 fL (ref 80.0–100.0)
Monocytes Absolute: 0.3 10*3/uL (ref 0.1–1.0)
Monocytes Relative: 5 %
Neutro Abs: 2.2 10*3/uL (ref 1.7–7.7)
Neutrophils Relative %: 35 %
Platelets: 178 10*3/uL (ref 150–400)
RBC: 2.66 MIL/uL — ABNORMAL LOW (ref 3.87–5.11)
RDW: 15.9 % — ABNORMAL HIGH (ref 11.5–15.5)
WBC: 6.2 10*3/uL (ref 4.0–10.5)
nRBC: 0 % (ref 0.0–0.2)

## 2022-10-05 MED ORDER — FENTANYL CITRATE (PF) 100 MCG/2ML IJ SOLN
INTRAMUSCULAR | Status: AC
  Filled 2022-10-05: qty 2

## 2022-10-05 MED ORDER — FENTANYL CITRATE (PF) 100 MCG/2ML IJ SOLN
INTRAMUSCULAR | Status: AC | PRN
  Administered 2022-10-05: 50 ug via INTRAVENOUS

## 2022-10-05 MED ORDER — LIDOCAINE HCL (PF) 1 % IJ SOLN
10.0000 mL | Freq: Once | INTRAMUSCULAR | Status: AC
  Administered 2022-10-05: 10 mL
  Filled 2022-10-05: qty 10

## 2022-10-05 MED ORDER — HEPARIN SOD (PORK) LOCK FLUSH 100 UNIT/ML IV SOLN
INTRAVENOUS | Status: AC
  Filled 2022-10-05: qty 5

## 2022-10-05 MED ORDER — MIDAZOLAM HCL 2 MG/2ML IJ SOLN
INTRAMUSCULAR | Status: AC
  Filled 2022-10-05: qty 4

## 2022-10-05 MED ORDER — MIDAZOLAM HCL 2 MG/2ML IJ SOLN
INTRAMUSCULAR | Status: AC | PRN
  Administered 2022-10-05: 1 mg via INTRAVENOUS

## 2022-10-05 MED ORDER — SODIUM CHLORIDE 0.9 % IV SOLN: INTRAVENOUS | Status: DC

## 2022-10-05 NOTE — Procedures (Signed)
Interventional Radiology Procedure Note  Procedure: CT guided aspirate and core biopsy of right iliac bone  Complications: None  Recommendations: - Bedrest supine x 1 hrs - Hydrocodone PRN  Pain - Follow biopsy results   Norena Bratton, MD   

## 2022-10-07 LAB — SURGICAL PATHOLOGY

## 2022-10-12 ENCOUNTER — Inpatient Hospital Stay: Payer: 59

## 2022-10-12 ENCOUNTER — Other Ambulatory Visit (HOSPITAL_COMMUNITY): Payer: Self-pay

## 2022-10-12 ENCOUNTER — Telehealth: Payer: Self-pay

## 2022-10-12 ENCOUNTER — Encounter: Payer: Self-pay | Admitting: Internal Medicine

## 2022-10-12 ENCOUNTER — Other Ambulatory Visit: Payer: Self-pay | Admitting: Internal Medicine

## 2022-10-12 ENCOUNTER — Ambulatory Visit
Admission: RE | Admit: 2022-10-12 | Discharge: 2022-10-12 | Disposition: A | Discharge status: Home / Self Care | Payer: 59 | Source: Ambulatory Visit | Attending: Internal Medicine | Admitting: Internal Medicine

## 2022-10-12 ENCOUNTER — Inpatient Hospital Stay (HOSPITAL_BASED_OUTPATIENT_CLINIC_OR_DEPARTMENT_OTHER): Payer: 59 | Admitting: Internal Medicine

## 2022-10-12 ENCOUNTER — Telehealth: Payer: Self-pay | Admitting: Pharmacist

## 2022-10-12 VITALS — BP 123/73 | HR 78 | Temp 97.8°F | Resp 18 | Wt 172.8 lb

## 2022-10-12 DIAGNOSIS — C9000 Multiple myeloma not having achieved remission: Secondary | ICD-10-CM

## 2022-10-12 DIAGNOSIS — Z95828 Presence of other vascular implants and grafts: Secondary | ICD-10-CM

## 2022-10-12 DIAGNOSIS — C9002 Multiple myeloma in relapse: Secondary | ICD-10-CM | POA: Diagnosis not present

## 2022-10-12 LAB — CMP (CANCER CENTER ONLY)
ALT: 10 U/L (ref 0–44)
AST: 24 U/L (ref 15–41)
Albumin: 3.5 g/dL (ref 3.5–5.0)
Alkaline Phosphatase: 78 U/L (ref 38–126)
Anion gap: 10 (ref 5–15)
BUN: 10 mg/dL (ref 8–23)
CO2: 22 mmol/L (ref 22–32)
Calcium: 8.5 mg/dL — ABNORMAL LOW (ref 8.9–10.3)
Chloride: 109 mmol/L (ref 98–111)
Creatinine: 0.8 mg/dL (ref 0.44–1.00)
GFR, Estimated: >60 mL/min (ref >60)
Glucose, Bld: 122 mg/dL — ABNORMAL HIGH (ref 70–99)
Potassium: 3.2 mmol/L — ABNORMAL LOW (ref 3.5–5.1)
Sodium: 141 mmol/L (ref 135–145)
Total Bilirubin: 0.4 mg/dL (ref 0.3–1.2)
Total Protein: 8.2 g/dL — ABNORMAL HIGH (ref 6.5–8.1)

## 2022-10-12 LAB — CBC WITH DIFFERENTIAL (CANCER CENTER ONLY)
Abs Immature Granulocytes: 0.01 10*3/uL (ref 0.00–0.07)
Basophils Absolute: 0 10*3/uL (ref 0.0–0.1)
Basophils Relative: 0 %
Eosinophils Absolute: 0.1 10*3/uL (ref 0.0–0.5)
Eosinophils Relative: 1 %
HCT: 26.8 % — ABNORMAL LOW (ref 36.0–46.0)
Hemoglobin: 8.5 g/dL — ABNORMAL LOW (ref 12.0–15.0)
Immature Granulocytes: 0 %
Lymphocytes Relative: 62 %
Lymphs Abs: 3.9 10*3/uL (ref 0.7–4.0)
MCH: 30.2 pg (ref 26.0–34.0)
MCHC: 31.7 g/dL (ref 30.0–36.0)
MCV: 95.4 fL (ref 80.0–100.0)
Monocytes Absolute: 0.3 10*3/uL (ref 0.1–1.0)
Monocytes Relative: 4 %
Neutro Abs: 2.1 10*3/uL (ref 1.7–7.7)
Neutrophils Relative %: 33 %
Platelet Count: 217 10*3/uL (ref 150–400)
RBC: 2.81 MIL/uL — ABNORMAL LOW (ref 3.87–5.11)
RDW: 16.4 % — ABNORMAL HIGH (ref 11.5–15.5)
WBC Count: 6.4 10*3/uL (ref 4.0–10.5)
nRBC: 0 % (ref 0.0–0.2)

## 2022-10-12 LAB — SAMPLE TO BLOOD BANK

## 2022-10-12 LAB — VITAMIN D 25 HYDROXY (VIT D DEFICIENCY, FRACTURES): Vit D, 25-Hydroxy: 50.77 ng/mL (ref 30–100)

## 2022-10-12 MED ORDER — GADOBUTROL 1 MMOL/ML IV SOLN
7.5000 mL | Freq: Once | INTRAVENOUS | Status: AC | PRN
  Administered 2022-10-12: 7.5 mL via INTRAVENOUS

## 2022-10-12 MED ORDER — HEPARIN SOD (PORK) LOCK FLUSH 100 UNIT/ML IV SOLN
500.0000 [IU] | Freq: Once | INTRAVENOUS | Status: AC
  Administered 2022-10-12: 500 [IU] via INTRAVENOUS
  Filled 2022-10-12: qty 5

## 2022-10-12 MED ORDER — VITAMIN D (ERGOCALCIFEROL) 1.25 MG (50000 UNIT) PO CAPS: 50000.0000 [IU] | ORAL_CAPSULE | ORAL | 0 refills | Status: DC

## 2022-10-12 MED ORDER — SODIUM CHLORIDE 0.9% FLUSH
10.0000 mL | Freq: Once | INTRAVENOUS | Status: AC
  Administered 2022-10-12: 10 mL via INTRAVENOUS
  Filled 2022-10-12: qty 10

## 2022-10-12 MED ORDER — POMALIDOMIDE 4 MG PO CAPS: 4.0000 mg | ORAL_CAPSULE | Freq: Every day | ORAL | 0 refills | Status: DC

## 2022-10-12 MED ORDER — TRAMADOL HCL 50 MG PO TABS: 50.0000 mg | ORAL_TABLET | Freq: Two times a day (BID) | ORAL | 0 refills | Status: DC | PRN

## 2022-10-12 NOTE — Telephone Encounter (Signed)
Oral Oncology Patient Advocate Encounter  New authorization   Received notification that prior authorization for Pomalyst is required.   PA submitted on 10/12/22  Key [id number]  Status is pending     Berdine Addison, Tibbie Patient [place]  [phone] (phone) [phone] (fax) 10/12/2022 12:52 PM

## 2022-10-12 NOTE — Progress Notes (Signed)
DISCONTINUE ON PATHWAY REGIMEN - Multiple Myeloma and Other Plasma Cell Dyscrasias     A cycle is every 28 days:     Daratumumab      Lenalidomide      Dexamethasone   **Always confirm dose/schedule in your pharmacy ordering system**  REASON: Disease Progression PRIOR TREATMENT: [id number]: DaraRd (Daratumumab 16 mg/kg + Lenalidomide 25 mg + Dexamethasone 40 mg) Until Progression or Unacceptable Toxicity TREATMENT RESPONSE: Progressive Disease (PD)  START ON PATHWAY REGIMEN - Multiple Myeloma and Other Plasma Cell Dyscrasias     Cycle 1: A cycle is 28 days:     Cyclophosphamide      Dexamethasone      Carfilzomib      Carfilzomib    Cycles 2 through 6: A cycle is every 28 days:     Cyclophosphamide      Dexamethasone      Carfilzomib   **Always confirm dose/schedule in your pharmacy ordering system**  Patient Characteristics: Multiple Myeloma, Relapsed / Refractory, Second through Fourth Lines of Therapy, Fit or Candidate for Triplet Therapy, Lenalidomide-Refractory or Lenalidomide-based Regimen Not Preferred, Not a Candidate for Anti-CD38 Antibody Disease Classification: Multiple Myeloma R-ISS Staging: III Therapeutic Status: Relapsed Line of Therapy: Third Line Anti-CD38 Antibody Candidacy: Not a Candidate for Anti-CD38 Antibody Lenalidomide-based Regimen Preference/Candidacy: Lenalidomide-Refractory Intent of Therapy: Non-Curative / Palliative Intent, Discussed with Patient

## 2022-10-12 NOTE — Assessment & Plan Note (Addendum)
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  Most recently on salvage therapy with daratumumab  lenalidomide [15 mg 3 w; 1-w] dexamethasone.  FEB 2024-lambda light chain 284 M protein 0.9-rising lambda light chain ratio. Discontinue Revlimid; and dara-given the progressive disease.   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  Check NGS panel. MARCH 2024- PET scan: No skeletal lesions noted.  However cervical spinal cord /conus medullaris-mild uptake noted. ?  Significance-recommend MRI of the lumbar spine with and without contrast for further evaluation.  If significant abnormality noted on MRI-would consider lumbar tap.  # Recommend start salvage chemotherapy with carfilzomib-dexamethasone and Pomalyst [4 mg-Take 21 days on, 7 days off, repeat every 28 days. ].  Discussed with patient and family.  Also discussed with Dr.Gasperratto.  Consider CAR-T therapy/collection-  MARCH 2024- Left ventricular ejection fraction, by estimation, is 50 to 55%.   # Left knee septic arthritis/immunocompromised host- [s/p Dr.Bowers; ID]; currently-S/p evaluation with ID-s/p ceftriaxone until March 8th; amoxicillin for 2 more days. Dicussed with Dr.Ravishankar.   # Hypokalemia- recommend compliance with Kdur BID.   # Iatrogenic hypothyroidism [ Graves/goiter s/p total thyroidectomy;aug,2022 ]-currently on Synthroid 100 mcg/day-September 2023 TSH normal.  Monitor closely.  # Worsening lower back discomfort x 6 months-see above.  MRI lumbar spine as above.  Continue tramadol.  # Chronic diastolic CHF- stable.   # Bone lesions /last zometa on 11/27/2017.    Dental extraction appointment appt-on Jan 26th.  PET scan evidence of any bone  lesions.-Hold Zometa for now,,  # Insomnia- recommend restart melatonin 1 to 2 mg nightly as needed.   Stable  # IV access: mediport- functional  # Vaccination: Flu shot s/p;  recommend COVID; recommend Pneumoani vacc after acute issues.   6 w- given dental extrac- on 1/26. :MM panel; K/l light chain ratio q 4 W  # DISPOSITION:  # MRI lumbar spine STAT # follow up in 3 weeks- MD; labs- cbc/cmp; hold tube; CRP; as per IS- Carfilzomib-Dex- Dr.B  # I reviewed the blood work- with the patient in detail; also reviewed the imaging independently [as summarized above]; and with the patient in detail.

## 2022-10-12 NOTE — Progress Notes (Signed)
[name] OFFICE PROGRESS NOTE  Patient Care Team: Marguerita Merles, MD as PCP - General (Family Medicine) End, Harrell Gave, MD as PCP - Cardiology (Cardiology) [name], [name] [name], MD as Consulting Physician (Hematology and Oncology) Ottie Glazier, MD as Consulting Physician (Pulmonary Disease) Gabriel Carina Betsey Holiday, MD as Consulting Physician (Endocrinology)   Cancer Staging  No matching staging information was found for the patient.  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 No morphologic support for residual myeloma disease. Negative for minimal residual disease by MM-MRD flow cytometry; HOLD REVLIMID [leg swelling]; AUG 2021-PET scan negative for myeloma; continue to hold Revlimid  # MARCH 2021-diastolic congestive heart failure [Dr.End]  # BMBx- OCT 2021- BMBx- 5% plasmacytosis-however this appears to be more polyclonal rather than monoclonal-not explain patient worsening anemia.   # OCT 2022- 18th- Dara- Rev-Dex; MARCH, 2024- PROGRESSION bone marrow biopsy  70% plasma cells; PET scan no bone lesions-however conus medullaris uptake-MRI lumbar spine-   #November 2021-hyperthyroidism/goiter- [D.Bennett/Dr.Solum]-methimazole. S/p Thyroidectomy [Dr.Kim; [facility] [id number]  --------------------------  # 12/12- RIGHT JUGULAR DVT-x 36m on xarelto; finished April 2020; September 2020-EGD/dysphagia; Dr. Vicente Males  # Acute renal failure [Dr.Singh; Proteinuria 1.5gm/day ]; acyclovir/Asprin ------------------------------------------------------------------------------------------------------------   DIAGNOSIS: [ ]  MULTIPLE MYELOMA  STAGE: III/HIGH RISK ;GOALS: CONTROL      Multiple myeloma not having achieved remission ([place])  11/21/2017 - 04/28/2018 Chemotherapy   Patient is on Treatment Plan : MYELOMA SALVAGE Cyclophosphamide / Carfilzomib / Dexamethasone (CCd) q28d     05/12/2021 - 02/25/2022 Chemotherapy   Patient is on Treatment Plan : MYELOMA RELAPSED REFRACTORY Daratumumab SQ + Lenalidomide + Dexamethasone (DaraRd) q28d     05/12/2021 - 07/23/2022 Chemotherapy   Patient is on Treatment Plan : MYELOMA RELAPSED REFRACTORY Daratumumab SQ + Lenalidomide + Dexamethasone (DaraRd) q28d     11/02/2022 -  Chemotherapy   Patient is on Treatment Plan : MYELOMA RELAPSED/ REFRACTORY Carfilzomib D1,8,15 (20/27) + Pomalidomide + Dexamethasone (KPd) q28d      INTERVAL HISTORY: Ambulating independently.  Alone.  Brandi [name] 72 y.o.  female pleasant patient above history of relapsed multiple myeloma--most recently on daratumumab-Revlimid-dexamethasone  is here for follow-up-reviewed use of the bone marrow biopsy/PET scan.   Patient continues to be on antibiotics-amoxicillin x 2 more days for septic arthritis of the left knee.  Patient went to ER on 3/7 with port bleeding.  Left knee pain, 6/10 pain (achy) scale today.  Tramadol was helping with the knee pain but will now need a refill (pended for Md review).     Occasional SOBr with bending that does bother her.    Bottom of feet feel like something is stuck to the bottom.    Review of Systems  Constitutional:  Positive for  malaise/fatigue. Negative for chills, diaphoresis and fever.  HENT:  Negative for nosebleeds and sore throat.   Eyes:  Negative for double vision.  Respiratory:  Negative for hemoptysis.   Cardiovascular:  Negative for chest pain, palpitations and orthopnea.  Gastrointestinal:  Negative for abdominal pain, blood in stool, constipation, heartburn and melena.  Genitourinary:  Negative for dysuria, frequency and urgency.  Musculoskeletal:  Positive for back pain.  Skin: Negative.  Negative for itching and rash.  Neurological:  Negative for dizziness, tingling, focal weakness, weakness and headaches.  Endo/Heme/Allergies:  Does not bruise/bleed easily.  Psychiatric/Behavioral:  Negative for depression. The patient is not nervous/anxious.     PAST MEDICAL HISTORY :  Past Medical History:  Diagnosis Date   (HFpEF) heart failure with preserved ejection fraction ([place])    a. 06/2020 Echo: EF 60-65%, no rwma, nl RV size/fxn. Triv MR. Triv TR/AI. Mod elev PASP.   Anxiety    COPD (chronic obstructive pulmonary disease) (HCC)    GERD (gastroesophageal reflux disease)    Hypertension    Hyperthyroidism    Hypokalemia    IgA myeloma (HCC)    Multiple myeloma (HCC)    Pneumonia    Red blood cell antibody positive, compatible PRBC difficult to obtain    S/P autologous bone marrow transplantation ([place])     PAST SURGICAL HISTORY :   Past Surgical History:  Procedure Laterality Date   ANTERIOR VITRECTOMY Left 10/07/2021   Procedure: ANTERIOR VITRECTOMY;  Surgeon: Leandrew Koyanagi, MD;  Location: [facility];  Service: Ophthalmology;  Laterality: Left;   CATARACT EXTRACTION W/PHACO Left 10/07/2021   Procedure: CATARACT EXTRACTION PHACO AND INTRAOCULAR LENS PLACEMENT ([place]) LEFT VISION BLUE;  Surgeon: Leandrew Koyanagi, MD;  Location: [facility];  Service:  Ophthalmology;  Laterality: Left;  kit for manual incision 14.56 01:21.4   CATARACT EXTRACTION W/PHACO Right 10/21/2021   Procedure: CATARACT EXTRACTION PHACO AND INTRAOCULAR LENS PLACEMENT ([facility]) RIGHT 11.12 01:48.1;  Surgeon: Leandrew Koyanagi, MD;  Location: [facility];  Service: Ophthalmology;  Laterality: Right;   ESOPHAGOGASTRODUODENOSCOPY (EGD) WITH PROPOFOL N/A 03/30/2019   Procedure: ESOPHAGOGASTRODUODENOSCOPY (EGD) WITH PROPOFOL;  Surgeon: Jonathon Bellows, MD;  Location: [facility] ENDOSCOPY;  Service: Gastroenterology;  Laterality: N/A;   IR FLUORO GUIDE PORT INSERTION RIGHT  12/16/2017   IR IMAGING GUIDED PORT INSERTION  06/15/2021   KNEE ARTHROSCOPY Left 09/03/2022   Procedure: ARTHROSCOPY KNEE DEBRIDEMENT;  Surgeon: Lovell Sheehan, MD;  Location: [facility];  Service: Orthopedics;  Laterality: Left;   THYROIDECTOMY N/A     FAMILY HISTORY :   Family History  Problem Relation Age of Onset   Pneumonia Mother    Seizures Father     SOCIAL HISTORY:   Social History   Tobacco Use   Smoking status: Former    Packs/day: .25    Types: Cigarettes    Quit date: 03/18/2021    Years since quitting: 1.5   Smokeless tobacco: Never   Tobacco comments:    2 cigarettes QOD  Vaping Use   Vaping Use: Never used  Substance Use Topics   Alcohol use: No   Drug use: No    ALLERGIES:  has No Known Allergies.  MEDICATIONS:  Current Outpatient Medications  Medication Sig Dispense Refill   albuterol (VENTOLIN HFA) 108 (90 Base) MCG/ACT inhaler Inhale 2 puffs into the lungs every 6 (six) hours as needed for wheezing or shortness of breath. 8 g 3   aspirin EC 81 MG EC tablet Take 1 tablet (81  mg total) by mouth daily. 90 tablet 3   buPROPion (WELLBUTRIN) 75 MG tablet Take 75 mg by mouth daily.     calcium-vitamin D (OSCAL WITH D) 500MG -200UNIT (5MCG) tablet Take 1 tablet by mouth 2 (two) times daily. 60 tablet 0   carvedilol (COREG) 3.125 MG tablet Take 1 tablet (3.125 mg total) by mouth  2 (two) times daily with a meal. 60 tablet 5   furosemide (LASIX) 40 MG tablet Take 1 tablet (40 mg total) by mouth daily. 90 tablet 3   levothyroxine (SYNTHROID) 100 MCG tablet TAKE 1 TABLET BY MOUTH BEFORE BREAKFAST 90 tablet 0   lidocaine-prilocaine (EMLA) cream Apply 1 application topically as needed. Apply to port and cover with saran wrap 1-2 hours prior to port access 30 g 1   magnesium oxide (MAG-OX) 400 (240 Mg) MG tablet Take 1 tablet (400 mg total) by mouth 2 (two) times daily. 60 tablet 3   montelukast (SINGULAIR) 10 MG tablet Take 1 tablet (10 mg total) by mouth at bedtime. 30 tablet 3   pantoprazole (PROTONIX) 40 MG tablet Take 1 tablet (40 mg total) by mouth daily. 90 tablet 0   potassium chloride SA (KLOR-CON M) 20 MEQ tablet Take 1 tablet (20 mEq total) by mouth 2 (two) times daily. 60 tablet 6   spironolactone (ALDACTONE) 25 MG tablet Take 1 tablet by mouth once daily 90 tablet 3   acyclovir (ZOVIRAX) 400 MG tablet Take 1 tablet (400 mg total) by mouth 2 (two) times daily. (Patient not taking: Reported on 10/05/2022) 60 tablet 3   amoxicillin (AMOXIL) 500 MG capsule Take 2 capsules (1,000 mg total) by mouth 3 (three) times daily. (Patient not taking: Reported on 10/12/2022) 42 capsule 1   benzonatate (TESSALON) 100 MG capsule Take 1 capsule (100 mg total) by mouth 2 (two) times daily as needed for cough. (Patient not taking: Reported on 10/12/2022) 40 capsule 0   ondansetron (ZOFRAN) 8 MG tablet One pill every 8 hours as needed for nausea/vomitting. (Patient not taking: Reported on 10/05/2022) 40 tablet 1   polyethylene glycol (MIRALAX / GLYCOLAX) packet Take 17 g by mouth daily as needed for mild constipation. (Patient not taking: Reported on 10/05/2022) 14 each 0   traMADol (ULTRAM) 50 MG tablet Take 1 tablet (50 mg total) by mouth every 12 (twelve) hours as needed. 60 tablet 0   Vitamin D, Ergocalciferol, (DRISDOL) 1.25 MG (50000 UNIT) CAPS capsule Take 1 capsule (50,000 Units total)  by mouth once a week. 12 capsule 0   No current facility-administered medications for this visit.   Facility-Administered Medications Ordered in Other Visits  Medication Dose Route Frequency Provider Last Rate Last Admin   0.9 %  sodium chloride infusion   Intravenous Continuous Monia Sabal, PA-C        PHYSICAL EXAMINATION:   BP 123/73 (BP Location: Right Arm, Patient Position: Sitting)   Pulse 78   Temp 97.8 F (36.6 C) (Tympanic)   Resp 18   Wt 172 lb 12.8 oz (78.4 kg)   SpO2 97%   BMI 31.61 kg/m   Filed Weights   10/12/22 1000  Weight: 172 lb 12.8 oz (78.4 kg)     Patient in wheelchair.  Accompanied by daughter.  Left knee swelling/warmth and erythema noted.  Positive for tenderness.  Physical Exam HENT:     Head: Normocephalic and atraumatic.  Eyes:     Pupils: Pupils are equal, round, and reactive to light.  Cardiovascular:  Rate and Rhythm: Normal rate and regular rhythm.  Pulmonary:     Effort: Pulmonary effort is normal. No respiratory distress.     Breath sounds: Normal breath sounds. No wheezing.  Abdominal:     General: Bowel sounds are normal. There is no distension.     Palpations: Abdomen is soft. There is no mass.     Tenderness: There is no abdominal tenderness. There is no guarding or rebound.  Musculoskeletal:        General: No tenderness. Normal range of motion.     Cervical back: Normal range of motion and neck supple.  Skin:    General: Skin is warm.  Neurological:     Mental Status: She is alert and oriented to person, place, and time.  Psychiatric:        Mood and Affect: Affect normal.    LABORATORY DATA:  I have reviewed the data as listed    Component Value Date/Time   NA 141 10/12/2022 0940   NA 141 01/25/2020 1530   K 3.2 (L) 10/12/2022 0940   CL 109 10/12/2022 0940   CO2 22 10/12/2022 0940   GLUCOSE 122 (H) 10/12/2022 0940   BUN 10 10/12/2022 0940   BUN 19 01/25/2020 1530   CREATININE 0.80 10/12/2022 0940    CALCIUM 8.5 (L) 10/12/2022 0940   PROT 8.2 (H) 10/12/2022 0940   ALBUMIN 3.5 10/12/2022 0940   AST 24 10/12/2022 0940   ALT 10 10/12/2022 0940   ALKPHOS 78 10/12/2022 0940   BILITOT 0.4 10/12/2022 0940   GFRNONAA >60 10/12/2022 0940   GFRAA NOT CALCULATED 04/17/2020 1352    No results found for: "SPEP", "UPEP"  Lab Results  Component Value Date   WBC 6.4 10/12/2022   NEUTROABS 2.1 10/12/2022   HGB 8.5 (L) 10/12/2022   HCT 26.8 (L) 10/12/2022   MCV 95.4 10/12/2022   PLT 217 10/12/2022      Chemistry      Component Value Date/Time   NA 141 10/12/2022 0940   NA 141 01/25/2020 1530   K 3.2 (L) 10/12/2022 0940   CL 109 10/12/2022 0940   CO2 22 10/12/2022 0940   BUN 10 10/12/2022 0940   BUN 19 01/25/2020 1530   CREATININE 0.80 10/12/2022 0940      Component Value Date/Time   CALCIUM 8.5 (L) 10/12/2022 0940   ALKPHOS 78 10/12/2022 0940   AST 24 10/12/2022 0940   ALT 10 10/12/2022 0940   BILITOT 0.4 10/12/2022 0940      (H): Data is abnormally high   Latest Reference Range & Units Most Recent 12/24/20 14:01 04/01/21 14:34 04/28/21 09:17 06/11/21 09:42 07/03/21 10:32 08/20/21 10:06  M Protein SerPl Elph-Mcnc Not Observed g/dL 1.1 (H) (C) 08/20/21 10:06 Not Observed (C) 0.6 (H) (C) 0.7 (H) (C) 0.5 (H) (C) 0.7 (H) (C) 1.1 (H) (C)    Latest Reference Range & Units Most Recent 09/17/21 08:47 10/15/21 08:25 11/12/21 08:55  Kappa free light chain 3.3 - 19.4 mg/L 5.0 11/12/21 08:55 7.2 7.2 5.0  Lambda free light chains 5.7 - 26.3 mg/L 223.6 (H) 11/12/21 08:55 215.3 (H) 340.2 (H) 223.6 (H)  Kappa, lambda light chain ratio 0.26 - 1.65  0.02 (L) 11/12/21 08:55 0.03 (L) 0.02 (L) 0.02 (L)  (H): Data is abnormally high (L): Data is abnormally low  RADIOGRAPHIC STUDIES: I have personally reviewed the radiological images as listed and agreed with the findings in the report. No results found.   ASSESSMENT & PLAN:  Multiple myeloma not having achieved remission ([place]) # RECURRENT  Multiple myeloma stage III [high-risk cytogenetics-status post KRD-VGPR ; status post autologous stem cell transplant on 06/15/18.  Most recently on salvage therapy with daratumumab  lenalidomide [15 mg 3 w; 1-w] dexamethasone.  FEB 2024-lambda light chain 284 M protein 0.9-rising lambda light chain ratio. Discontinue Revlimid; and dara-given the progressive disease.   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  Check NGS panel. MARCH 2024- PET scan: No skeletal lesions noted.  However cervical spinal cord /conus medullaris-mild uptake noted. ?  Significance-recommend MRI of the lumbar spine with and without contrast for further evaluation.  If significant abnormality noted on MRI-would consider lumbar tap.  # Recommend start salvage chemotherapy with carfilzomib-dexamethasone and Pomalyst [4 mg-Take 21 days on, 7 days off, repeat every 28 days. ].  Discussed with patient and family.  Also discussed with Dr.Gasperratto.  Consider CAR-T therapy/collection-  MARCH 2024- Left ventricular ejection fraction, by estimation, is 50 to 55%.   # Left knee septic arthritis/immunocompromised host- [s/p Dr.Bowers; ID]; currently-S/p evaluation with ID-s/p ceftriaxone until March 8th; amoxicillin for 2 more days. Dicussed with Dr.Ravishankar.   # Hypokalemia- recommend compliance with Kdur BID.   # Iatrogenic hypothyroidism [ Graves/goiter s/p total thyroidectomy;aug,2022 ]-currently on Synthroid 100 mcg/day-September 2023 TSH normal.  Monitor closely.  # Worsening lower back discomfort x 6 months-see above.  MRI lumbar spine as above.  Continue tramadol.  # Chronic diastolic CHF- stable.   # Bone lesions /last zometa on 11/27/2017.    Dental extraction appointment appt-on Jan 26th.  PET scan evidence of any bone lesions.-Hold Zometa  for now,,  # Insomnia- recommend restart melatonin 1 to 2 mg nightly as needed.   Stable  # IV access: mediport- functional  # Vaccination: Flu shot s/p;  recommend COVID; recommend Pneumoani vacc after acute issues.   6 w- given dental extrac- on 1/26. :MM panel; K/l light chain ratio q 4 W  # DISPOSITION:  # MRI lumbar spine STAT # follow up in 3 weeks- MD; labs- cbc/cmp; hold tube; CRP; as per IS- Carfilzomib-Dex- Dr.B  # I reviewed the blood work- with the patient in detail; also reviewed the imaging independently [as summarized above]; and with the patient in detail.       Orders Placed This Encounter  Procedures   MR LUMBAR SPINE W WO CONTRAST    Standing Status:   Future    Standing Expiration Date:   10/12/2023    Order Specific Question:   If indicated for the ordered procedure, I authorize the administration of contrast media per Radiology protocol    Answer:   Yes    Order Specific Question:   What is the patient's sedation requirement?    Answer:   No Sedation    Order Specific Question:   Does the patient have a pacemaker or implanted devices?    Answer:   No    Order Specific Question:   Use SRS Protocol?    Answer:   No    Order Specific Question:   Preferred imaging location?    Answer:   [facility] (table limit - 550lbs)   CBC with Differential (Cancer Center Only)    Standing Status:   Future    Standing Expiration Date:   10/04/2023   CMP [facility]  only)    Standing Status:   Future    Standing Expiration Date:   10/12/2023   C-reactive protein    Standing Status:   Future    Standing Expiration Date:   10/12/2023   CBC with Differential (Cancer Center Only)    Standing Status:   Future    Standing Expiration Date:   11/02/2023   CMP ([facility] only)    Standing Status:   Future    Standing Expiration Date:   11/02/2023   Sample to Blood Bank    Standing Status:   Future    Standing Expiration Date:   10/12/2023     All questions were  answered. The patient knows to call the clinic with any problems, questions or concerns.      [name], MD 10/12/2022 12:40 PM

## 2022-10-12 NOTE — Progress Notes (Signed)
Patient went to ER on 3/7 with port bleeding.  Left knee pain, 6/10 pain (achy) scale today.  Tramadol was helping with the knee pain but will now need a refill (pended for Md review).    Occasional SOBr with bending that does bother her.  Bottom of feet feel like something is stuck to the bottom.  Also will need a refill on Vitamin D if she is to continue (pended for Md review).  Now taking oral Amoxicillin and has 2 more days of rx.  Concerned with feeling of retaining fluid in abdomen.

## 2022-10-12 NOTE — Progress Notes (Signed)
DISCONTINUE ON PATHWAY REGIMEN - Multiple Myeloma and Other Plasma Cell Dyscrasias     Cycle 1: A cycle is 28 days:     Cyclophosphamide      Dexamethasone      Carfilzomib      Carfilzomib    Cycles 2 through 6: A cycle is every 28 days:     Cyclophosphamide      Dexamethasone      Carfilzomib   **Always confirm dose/schedule in your pharmacy ordering system**  REASON: Other Reason PRIOR TREATMENT: [id number]: KCd (Carfilzomib 20/36 mg/m2 + Cyclophosphamide 500 mg PO + Dexamethasone 40 mg PO) q28 Days for up to 6 Cycles TREATMENT RESPONSE: Unable to Evaluate  START ON PATHWAY REGIMEN - Multiple Myeloma and Other Plasma Cell Dyscrasias     A cycle is every 28 days:     Carfilzomib      Carfilzomib      Carfilzomib      Pomalidomide      Dexamethasone   **Always confirm dose/schedule in your pharmacy ordering system**  Patient Characteristics: Multiple Myeloma, Relapsed / Refractory, Second through Fourth Lines of Therapy, Fit or Candidate for Triplet Therapy, Lenalidomide-Refractory or Lenalidomide-based Regimen Not Preferred, Not a Candidate for Anti-CD38 Antibody Disease Classification: Multiple Myeloma R-ISS Staging: III Therapeutic Status: Relapsed Line of Therapy: Second Line Anti-CD38 Antibody Candidacy: Not a Candidate for Anti-CD38 Antibody Lenalidomide-based Regimen Preference/Candidacy: Lenalidomide-Refractory Intent of Therapy: Non-Curative / Palliative Intent, Discussed with Patient

## 2022-10-12 NOTE — Telephone Encounter (Signed)
Oral Oncology Pharmacist Encounter  Received new prescription for Pomalyst (pomalidomide) for the treatment of progressive multiple myeloma in conjunction with carfilzomib and dexamethasone , planned duration until disease control or unacceptable drug toxicity. Planned start 11/02/22.  CMP from 10/12/22 assessed, no relevant lab abnormalities. Prescription dose and frequency assessed.   Current medication list in Epic reviewed, no relevant DDIs with pomalidomide identified.  Evaluated chart and no patient barriers to medication adherence identified.   Prescription has been e-scribed to [facility] for benefits analysis and approval.  Oral Oncology Clinic will continue to follow for insurance authorization, copayment issues, initial counseling and start date.   Darl Pikes, PharmD, BCPS, BCOP, CPP Hematology/Oncology Clinical Pharmacist Practitioner [place]/DB/AP [facility] [phone]  10/12/2022 12:55 PM

## 2022-10-12 NOTE — Telephone Encounter (Signed)
Oral Oncology Patient Advocate Encounter  Prior Authorization for Pomalyst has been approved.    PA# [id number]  Effective dates: 10/12/22 through 07/26/23  Patients co-pay is $0.00.    Berdine Addison, [facility] Patient [place]  [phone] (phone) [phone] (fax) 10/12/2022 1:11 PM

## 2022-10-13 ENCOUNTER — Telehealth: Payer: Self-pay | Admitting: Internal Medicine

## 2022-10-13 ENCOUNTER — Encounter (HOSPITAL_COMMUNITY): Payer: Self-pay | Admitting: Internal Medicine

## 2022-10-13 DIAGNOSIS — C9000 Multiple myeloma not having achieved remission: Secondary | ICD-10-CM

## 2022-10-13 NOTE — Telephone Encounter (Signed)
Per chat:  I ordered Prevnar 20 vaccination for this patient in about 3 weeks. Please check if the order is appropriate. BC-please schedule. Thanks   Appointment scheduled and patient notified via voicemail

## 2022-10-13 NOTE — Telephone Encounter (Signed)
Discussed with Dr. Gertie Exon CAR-T therapy with the patient-relapsed myeloma.  However given septic arthritis risk of infection/logistics-could be an issue.  I discussed with the patient-MRI lumbar spine negative for acute process.  As per recommendations-would recommend C-spine and T-spine MRI ASAP  Patient also recommended to call [facility] to discuss her CAR-T options. Patient does have Dr.G's number.   Patient will keep appointment as planned with [place] in April- for car-dex-Pom.  Please schedule MRIs; ASAP follow up as planned.

## 2022-10-13 NOTE — Telephone Encounter (Signed)
Prescription and supporting documentation have been sent to [facility] for processing.    Berdine Addison, [facility] Patient [place]  [phone] (phone) [phone] (fax) 10/13/2022 9:48 AM

## 2022-10-14 ENCOUNTER — Ambulatory Visit: Payer: 59 | Attending: Infectious Diseases | Admitting: Infectious Diseases

## 2022-10-14 ENCOUNTER — Encounter: Payer: Self-pay | Admitting: Infectious Diseases

## 2022-10-14 VITALS — BP 125/79 | HR 64 | Temp 96.3°F

## 2022-10-14 DIAGNOSIS — C9000 Multiple myeloma not having achieved remission: Secondary | ICD-10-CM | POA: Insufficient documentation

## 2022-10-14 DIAGNOSIS — E890 Postprocedural hypothyroidism: Secondary | ICD-10-CM | POA: Diagnosis not present

## 2022-10-14 DIAGNOSIS — M25662 Stiffness of left knee, not elsewhere classified: Secondary | ICD-10-CM | POA: Insufficient documentation

## 2022-10-14 DIAGNOSIS — Z23 Encounter for immunization: Secondary | ICD-10-CM | POA: Diagnosis present

## 2022-10-14 DIAGNOSIS — M00861 Arthritis due to other bacteria, right knee: Secondary | ICD-10-CM | POA: Diagnosis not present

## 2022-10-14 DIAGNOSIS — E0500 Thyrotoxicosis with diffuse goiter without thyrotoxic crisis or storm: Secondary | ICD-10-CM | POA: Diagnosis not present

## 2022-10-14 NOTE — Patient Instructions (Signed)
You are here for follow up of pneumococcus - today you got the vaccine prevnar 20- you can follow with Dr.B for chemo

## 2022-10-14 NOTE — Progress Notes (Signed)
NAME: Brandi [name]  DOB: 18-Apr-1951  MRN: [id number]  Date/Time: 10/14/2022 11:15 AM  Subjective:  Daughter is with her Patient is here for follow-up. She recent delay had pneumococcal septic arthritis of the left knee and completed 4 weeks of IV antibiotic followed by a week of p.o. amoxicillin.  She is doing much better.  She is ambulating better She has got some stiffness in the left knee Brandi [name] is a 72 y.o. with a history of recurrent multiple myeloma s/p Stem cell transplant 2019, currently on salvage Rx with daratumumab/lenalidomide/dexa followed by [facility] onc,  hypothyroidism secondary to total thyroidectomy for Graves in 2022,   Today she is here to receive pneumococcal conjugate vaccine 20 as well  Past Medical History:  Diagnosis Date   (HFpEF) heart failure with preserved ejection fraction ([place])    a. 06/2020 Echo: EF 60-65%, no rwma, nl RV size/fxn. Triv MR. Triv TR/AI. Mod elev PASP.   Anxiety    COPD (chronic obstructive pulmonary disease) (HCC)    GERD (gastroesophageal reflux disease)    Hypertension    Hyperthyroidism    Hypokalemia    IgA myeloma (HCC)    Multiple myeloma (HCC)    Pneumonia    Red blood cell antibody positive, compatible PRBC difficult to obtain    S/P autologous bone marrow transplantation [facility])     Past Surgical History:  Procedure Laterality Date   ANTERIOR VITRECTOMY Left 10/07/2021   Procedure: ANTERIOR VITRECTOMY;  Surgeon: Leandrew Koyanagi, MD;  Location: [place];  Service: Ophthalmology;  Laterality: Left;   CATARACT EXTRACTION W/PHACO Left 10/07/2021   Procedure: CATARACT EXTRACTION PHACO AND INTRAOCULAR LENS PLACEMENT ([place]) LEFT VISION BLUE;  Surgeon: Leandrew Koyanagi, MD;  Location: [place];  Service: Ophthalmology;  Laterality: Left;  kit for manual incision 14.56 01:21.4   CATARACT EXTRACTION W/PHACO Right 10/21/2021   Procedure: CATARACT EXTRACTION PHACO AND INTRAOCULAR LENS PLACEMENT ([facility])  RIGHT 11.12 01:48.1;  Surgeon: Leandrew Koyanagi, MD;  Location: [place];  Service: Ophthalmology;  Laterality: Right;   ESOPHAGOGASTRODUODENOSCOPY (EGD) WITH PROPOFOL N/A 03/30/2019   Procedure: ESOPHAGOGASTRODUODENOSCOPY (EGD) WITH PROPOFOL;  Surgeon: Jonathon Bellows, MD;  Location: [facility];  Service: Gastroenterology;  Laterality: N/A;   IR FLUORO GUIDE PORT INSERTION RIGHT  12/16/2017   IR IMAGING GUIDED PORT INSERTION  06/15/2021   KNEE ARTHROSCOPY Left 09/03/2022   Procedure: ARTHROSCOPY KNEE DEBRIDEMENT;  Surgeon: Lovell Sheehan, MD;  Location: [facility];  Service: Orthopedics;  Laterality: Left;   THYROIDECTOMY N/A     Social History   Socioeconomic History   Marital status: Single    Spouse name: Not on file   Number of children: 3   Years of education: Not on file   Highest education level: Not on file  Occupational History   Not on file  Tobacco Use   Smoking status: Former    Packs/day: .25    Types: Cigarettes    Quit date: 03/18/2021    Years since quitting: 1.5   Smokeless tobacco: Never   Tobacco comments:    2 cigarettes QOD  Vaping Use   Vaping Use: Never used  Substance and Sexual Activity   Alcohol use: No   Drug use: No   Sexual activity: Not Currently  Other Topics Concern   Not on file  Social History Narrative   Lives at home with family(son): daughter 8 blocks away. States "I'm never alone." Independent at baseline   Social Determinants of Health  Financial Resource Strain: Not on file  Food Insecurity: No Food Insecurity (09/04/2022)   Hunger Vital Sign    Worried About Running Out of Food in the Last Year: Never true    Ran Out of Food in the Last Year: Never true  Transportation Needs: No Transportation Needs (09/04/2022)   PRAPARE - Hydrologist (Medical): No    Lack of Transportation (Non-Medical): No  Physical Activity: Not on file  Stress: Not on file  Social Connections: Not on file   Intimate Partner Violence: Not At Risk (09/04/2022)   Humiliation, Afraid, Rape, and Kick questionnaire    Fear of Current or Ex-Partner: No    Emotionally Abused: No    Physically Abused: No    Sexually Abused: No    Family History  Problem Relation Age of Onset   Pneumonia Mother    Seizures Father    No Known Allergies I? Current Outpatient Medications  Medication Sig Dispense Refill   acyclovir (ZOVIRAX) 400 MG tablet Take 1 tablet (400 mg total) by mouth 2 (two) times daily. 60 tablet 3   albuterol (VENTOLIN HFA) 108 (90 Base) MCG/ACT inhaler Inhale 2 puffs into the lungs every 6 (six) hours as needed for wheezing or shortness of breath. 8 g 3   amoxicillin (AMOXIL) 500 MG capsule Take 2 capsules (1,000 mg total) by mouth 3 (three) times daily. 42 capsule 1   aspirin EC 81 MG EC tablet Take 1 tablet (81 mg total) by mouth daily. 90 tablet 3   benzonatate (TESSALON) 100 MG capsule Take 1 capsule (100 mg total) by mouth 2 (two) times daily as needed for cough. 40 capsule 0   buPROPion (WELLBUTRIN) 75 MG tablet Take 75 mg by mouth daily.     calcium-vitamin D (OSCAL WITH D) 500MG -200UNIT (5MCG) tablet Take 1 tablet by mouth 2 (two) times daily. 60 tablet 0   carvedilol (COREG) 3.125 MG tablet Take 1 tablet (3.125 mg total) by mouth 2 (two) times daily with a meal. 60 tablet 5   furosemide (LASIX) 40 MG tablet Take 1 tablet (40 mg total) by mouth daily. 90 tablet 3   levothyroxine (SYNTHROID) 100 MCG tablet TAKE 1 TABLET BY MOUTH BEFORE BREAKFAST 90 tablet 0   lidocaine-prilocaine (EMLA) cream Apply 1 application topically as needed. Apply to port and cover with saran wrap 1-2 hours prior to port access 30 g 1   magnesium oxide (MAG-OX) 400 (240 Mg) MG tablet Take 1 tablet (400 mg total) by mouth 2 (two) times daily. 60 tablet 3   montelukast (SINGULAIR) 10 MG tablet Take 1 tablet (10 mg total) by mouth at bedtime. 30 tablet 3   ondansetron (ZOFRAN) 8 MG tablet One pill every 8 hours  as needed for nausea/vomitting. 40 tablet 1   polyethylene glycol (MIRALAX / GLYCOLAX) packet Take 17 g by mouth daily as needed for mild constipation. 14 each 0   pomalidomide (POMALYST) 4 MG capsule Take 1 capsule (4 mg total) by mouth daily. Take for 21 days, then old for 7 days. Repeat every 28 days. 21 capsule 0   potassium chloride SA (KLOR-CON M) 20 MEQ tablet Take 1 tablet (20 mEq total) by mouth 2 (two) times daily. 60 tablet 6   spironolactone (ALDACTONE) 25 MG tablet Take 1 tablet by mouth once daily 90 tablet 3   traMADol (ULTRAM) 50 MG tablet Take 1 tablet (50 mg total) by mouth every 12 (twelve) hours as needed. [place]  tablet 0   Vitamin D, Ergocalciferol, (DRISDOL) 1.25 MG (50000 UNIT) CAPS capsule Take 1 capsule (50,000 Units total) by mouth once a week. 12 capsule 0   pantoprazole (PROTONIX) 40 MG tablet Take 1 tablet (40 mg total) by mouth daily. 90 tablet 0   No current facility-administered medications for this visit.   Facility-Administered Medications Ordered in Other Visits  Medication Dose Route Frequency Provider Last Rate Last Admin   0.9 %  sodium chloride infusion   Intravenous Continuous Monia Sabal, PA-C         Abtx:  Anti-infectives (From admission, onward)    None       REVIEW OF SYSTEMS:  Const: negative fever, negative chills, negative weight loss Eyes: negative diplopia or visual changes, negative eye pain ENT: negative coryza, negative sore throat Resp: negative cough, hemoptysis, dyspnea Cards: negative for chest pain, palpitations, lower extremity edema GU: negative for frequency, dysuria and hematuria GI: Negative for abdominal pain, diarrhea, bleeding, constipation Skin: negative for rash and pruritus Heme: negative for easy bruising and gum/nose bleeding MS: Left knee stiffness Neurolo:negative for headaches, dizziness, vertigo, memory problems  Psych: negative for feelings of anxiety, depression  Endocrine: Thyroid  problem Allergy/Immunology- negative for any medication or food allergies  Objective:  VITALS:  BP 125/79   Pulse 64   Temp (!) 96.3 F (35.7 C) (Temporal)  LDA port PHYSICAL EXAM:  General: Alert, cooperative, no distress, appears stated age.  Head: Normocephalic, without obvious abnormality, atraumatic. Eyes: Conjunctivae clear, anicteric sclerae. Pupils are equal ENT Nares normal. No drainage or sinus tenderness. Lips, mucosa, and tongue normal. No Thrush Neck: Supple, symmetrical, no adenopathy, thyroid: non tender no carotid bruit and no JVD. Lungs: Clear to auscultation bilaterally. No Wheezing or Rhonchi. No rales. Heart: Regular rate and rhythm, no murmur, rub or gallop. PORT in place Abdomen: did not examine  Extremities: left knee-the laparoscopic scars have healed very well.  She ambulated well in the corridor swelling much better No erythema or warmth   Skin: No rashes or lesions. Or bruising Lymph: Cervical, supraclavicular normal. Neurologic: Grossly non-focal Pertinent Labs Lab Results 09/21/22 Wbc 7.5 Hb 8.6 Plt 336 Cr 1.01  ? Impression/Recommendation ?streptococcus pneumoniae septic arthritis of the left knee  S/p Arthroscopic debridement of left knee Treated with 4 weeks of IV  ceftriaxone which she completed on 10/01/22.  After that she took 1 more week of amoxicillin.   ESR 120 which is likely due to multiple myeloma/CRP less than 1  Previous h/o pneumococcus bacteremia in 2022  pt has not received pneumonia vaccine-today she got Prevnar 20 vaccine Multiple myeloma-chemo to be restarted soon   hypothyroidism secondary to total thyroidectomy for Grave's disease ? ? ___________________________________________________ Discussed with patient, and her daughter in great detail. Follow-up as needed  Note:  This document was prepared using Dragon voice recognition software and may include unintentional dictation errors.

## 2022-10-18 ENCOUNTER — Encounter (HOSPITAL_COMMUNITY): Payer: Self-pay | Admitting: Internal Medicine

## 2022-10-18 ENCOUNTER — Telehealth: Payer: Self-pay | Admitting: Internal Medicine

## 2022-10-18 NOTE — Telephone Encounter (Signed)
pt called to req phone call from MD.pt states thats he is supposed to be having teeth pulled tomorrow and was instructed to notify him.

## 2022-10-19 ENCOUNTER — Ambulatory Visit
Admission: RE | Admit: 2022-10-19 | Discharge: 2022-10-19 | Disposition: A | Discharge status: Home / Self Care | Payer: 59 | Source: Ambulatory Visit | Attending: Internal Medicine | Admitting: Internal Medicine

## 2022-10-19 ENCOUNTER — Ambulatory Visit: Payer: 59

## 2022-10-19 DIAGNOSIS — C9000 Multiple myeloma not having achieved remission: Secondary | ICD-10-CM | POA: Diagnosis present

## 2022-10-19 MED ORDER — GADOBUTROL 1 MMOL/ML IV SOLN
7.5000 mL | Freq: Once | INTRAVENOUS | Status: AC | PRN
  Administered 2022-10-19: 7.5 mL via INTRAVENOUS

## 2022-10-26 ENCOUNTER — Other Ambulatory Visit: Payer: Self-pay

## 2022-10-27 ENCOUNTER — Encounter: Payer: Self-pay | Admitting: Internal Medicine

## 2022-10-29 ENCOUNTER — Other Ambulatory Visit: Payer: Self-pay

## 2022-10-31 ENCOUNTER — Other Ambulatory Visit: Payer: Self-pay

## 2022-11-01 MED FILL — Dexamethasone Sodium Phosphate Inj 100 MG/10ML: INTRAMUSCULAR | Qty: 2 | Status: AC

## 2022-11-02 ENCOUNTER — Telehealth: Payer: Self-pay | Admitting: *Deleted

## 2022-11-02 ENCOUNTER — Inpatient Hospital Stay: Payer: 59 | Admitting: Pharmacist

## 2022-11-02 ENCOUNTER — Inpatient Hospital Stay: Payer: 59

## 2022-11-02 ENCOUNTER — Ambulatory Visit: Payer: 59 | Admitting: Pharmacist

## 2022-11-02 ENCOUNTER — Inpatient Hospital Stay (HOSPITAL_BASED_OUTPATIENT_CLINIC_OR_DEPARTMENT_OTHER): Payer: 59 | Admitting: Internal Medicine

## 2022-11-02 ENCOUNTER — Inpatient Hospital Stay: Payer: 59 | Attending: Nurse Practitioner

## 2022-11-02 VITALS — BP 117/73 | HR 76 | Temp 98.0°F | Resp 18 | Wt 173.0 lb

## 2022-11-02 DIAGNOSIS — D649 Anemia, unspecified: Secondary | ICD-10-CM | POA: Diagnosis not present

## 2022-11-02 DIAGNOSIS — C9002 Multiple myeloma in relapse: Secondary | ICD-10-CM | POA: Insufficient documentation

## 2022-11-02 DIAGNOSIS — I110 Hypertensive heart disease with heart failure: Secondary | ICD-10-CM | POA: Diagnosis not present

## 2022-11-02 DIAGNOSIS — Z5112 Encounter for antineoplastic immunotherapy: Secondary | ICD-10-CM | POA: Diagnosis present

## 2022-11-02 DIAGNOSIS — C9000 Multiple myeloma not having achieved remission: Secondary | ICD-10-CM

## 2022-11-02 LAB — CMP (CANCER CENTER ONLY)
ALT: 10 U/L (ref 0–44)
AST: 18 U/L (ref 15–41)
Albumin: 3.8 g/dL (ref 3.5–5.0)
Alkaline Phosphatase: 77 U/L (ref 38–126)
Anion gap: 10 (ref 5–15)
BUN: 18 mg/dL (ref 8–23)
CO2: 22 mmol/L (ref 22–32)
Calcium: 8.4 mg/dL — ABNORMAL LOW (ref 8.9–10.3)
Chloride: 106 mmol/L (ref 98–111)
Creatinine: 0.98 mg/dL (ref 0.44–1.00)
GFR, Estimated: >60 mL/min (ref >60)
Glucose, Bld: 104 mg/dL — ABNORMAL HIGH (ref 70–99)
Potassium: 3.8 mmol/L (ref 3.5–5.1)
Sodium: 138 mmol/L (ref 135–145)
Total Bilirubin: 0.3 mg/dL (ref 0.3–1.2)
Total Protein: 8.4 g/dL — ABNORMAL HIGH (ref 6.5–8.1)

## 2022-11-02 LAB — CBC WITH DIFFERENTIAL (CANCER CENTER ONLY)
Abs Immature Granulocytes: 0.02 10*3/uL (ref 0.00–0.07)
Basophils Absolute: 0 10*3/uL (ref 0.0–0.1)
Basophils Relative: 0 %
Eosinophils Absolute: 0.1 10*3/uL (ref 0.0–0.5)
Eosinophils Relative: 1 %
HCT: 27.9 % — ABNORMAL LOW (ref 36.0–46.0)
Hemoglobin: 8.9 g/dL — ABNORMAL LOW (ref 12.0–15.0)
Immature Granulocytes: 0 %
Lymphocytes Relative: 57 %
Lymphs Abs: 4.3 10*3/uL — ABNORMAL HIGH (ref 0.7–4.0)
MCH: 30.6 pg (ref 26.0–34.0)
MCHC: 31.9 g/dL (ref 30.0–36.0)
MCV: 95.9 fL (ref 80.0–100.0)
Monocytes Absolute: 0.3 10*3/uL (ref 0.1–1.0)
Monocytes Relative: 4 %
Neutro Abs: 2.9 10*3/uL (ref 1.7–7.7)
Neutrophils Relative %: 38 %
Platelet Count: 220 10*3/uL (ref 150–400)
RBC: 2.91 MIL/uL — ABNORMAL LOW (ref 3.87–5.11)
RDW: 16.6 % — ABNORMAL HIGH (ref 11.5–15.5)
Smear Review: NORMAL
WBC Count: 7.6 10*3/uL (ref 4.0–10.5)
nRBC: 0 % (ref 0.0–0.2)

## 2022-11-02 LAB — C-REACTIVE PROTEIN: CRP: <0.5 mg/dL (ref <1.0)

## 2022-11-02 LAB — SAMPLE TO BLOOD BANK

## 2022-11-02 MED ORDER — DEXTROSE 5 % IV SOLN
30.0000 mg | Freq: Once | INTRAVENOUS | Status: AC
  Administered 2022-11-02: 30 mg via INTRAVENOUS
  Filled 2022-11-02: qty 15

## 2022-11-02 MED ORDER — SODIUM CHLORIDE 0.9 % IV SOLN
Freq: Once | INTRAVENOUS | Status: DC
  Filled 2022-11-02: qty 250

## 2022-11-02 MED ORDER — SODIUM CHLORIDE 0.9 % IV SOLN
Freq: Once | INTRAVENOUS | Status: AC
  Filled 2022-11-02: qty 250

## 2022-11-02 MED ORDER — SODIUM CHLORIDE 0.9 % IV SOLN
20.0000 mg | Freq: Once | INTRAVENOUS | Status: AC
  Administered 2022-11-02: 20 mg via INTRAVENOUS
  Filled 2022-11-02: qty 20

## 2022-11-02 MED ORDER — DEXTROSE 5 % IV SOLN
20.0000 mg/m2 | Freq: Once | INTRAVENOUS | Status: DC
  Filled 2022-11-02: qty 20

## 2022-11-02 MED ORDER — HEPARIN SOD (PORK) LOCK FLUSH 100 UNIT/ML IV SOLN
500.0000 [IU] | Freq: Once | INTRAVENOUS | Status: AC | PRN
  Administered 2022-11-02: 500 [IU]
  Filled 2022-11-02: qty 5

## 2022-11-02 NOTE — Assessment & Plan Note (Addendum)
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  Most recently progressed on salvage therapy with daratumumab  lenalidomide [15 mg 3 w; 1-w] dexamethasone. plasma cell myeloma by approximately 70% of the overall cellular marrow;  FEB 2024-lambda light chain 284 Bone marrow-70% plasma cells. M Protein 0.9-rising lambda light chain ratio. Discontinue Revlimid; and dara-given the progressive disease.  MARCH 2024- PET scan: No skeletal lesions noted.  However cervical spinal cord /conus medullaris-mild uptake noted. ?  Significance-recommend MRI of the lumbar spine with and without contrast for further evaluation.MRI-cervical/thoracic/lumbar spine-negative for any lesions in the spinal cord.  # Recommend start salvage chemotherapy with carfilzomib-dexamethasone [days 08/02/13 every 28 days] and Pomalyst [4 mg-Take 21 days on, 7 days off, repeat every 28 days. ]. Discussed with patient and family.  Also discussed with Dr.Gasperratto.  Consider CAR-T therapy/collection-  MARCH 2024- Left ventricular ejection fraction, by estimation, is 50 to 55%.   # Left knee septic arthritis/immunocompromised host- [s/p Dr.Bowers; ID]; currently-S/p evaluation with ID-s/p antibioics- stable.   # Hypokalemia- continue with Kdur BID- stable.   # Iatrogenic hypothyroidism [ Graves/goiter s/p total thyroidectomy;aug,2022 ]-currently on Synthroid 100 mcg/day-September 2023 TSH normal.  stable.   # Worsening lower back discomfort x 6 months-see above.  MRI lumbar spine as above.  Continue tramadol.  # Chronic diastolic CHF-stable.   # Bone lesions /last zometa on 11/27/2017.    Dental extraction appointment appt-on Jan 26th.  PET scan evidence of any bone lesions.-Hold Zometa for now. stable.   # Insomnia- recommend restart melatonin 1 to 2 mg nightly as needed.  stable.   # IV access: mediport- functional  # Vaccination: Flu shot  s/p;  s/p Pneumonia vaccination [march 2024].   6 w- given dental extrac- on 1/26. :MM panel; K/l light chain ratio q 4 W  # DISPOSITION:  # chemo today. # as per IS-  #1 week- MD; labs- cbc/cmp; hold tube; Carfilzomib-Dex- # 2 week- MD; labs- cbc/cmp;hold tube;  Carfilzomib-Dex- Dr.B

## 2022-11-02 NOTE — Telephone Encounter (Signed)
Called pt's daughter Brandi [name] to let her know I faxed [facility] Form [phone] for Attestation of Medical Need for Personal Care Services to Fax #  [phone].

## 2022-11-02 NOTE — Progress Notes (Signed)
Oral Chemotherapy Clinic [facility]  [phone] [phone]  Patient Care Team: Leanna Sato, MD as PCP - General (Family Medicine) End, Cristal Deer, MD as PCP - Cardiology (Cardiology) Eddie Candle, [name] Earna Coder, MD as Consulting Physician (Hematology and Oncology) Vida Rigger, MD as Consulting Physician (Pulmonary Disease) Tedd Sias Marlana Salvage, MD as Consulting Physician (Endocrinology)   Name of the patient: Brandi [name]  [id number]  22-Jul-1951   Date of visit: 11/02/22  HPI: Patient is a 72 y.o. female with progressive multiple myeloma. The plan is for the patient to switch to treatment with carfilzomib, pomalidomide, and dexamethasone starting today 11/02/22.  Reason for Consult: Pomalidomide oral chemotherapy education.   PAST MEDICAL HISTORY: Past Medical History:  Diagnosis Date   (HFpEF) heart failure with preserved ejection fraction (HCC)    a. 06/2020 Echo: EF 60-65%, no rwma, nl RV size/fxn. Triv MR. Triv TR/AI. Mod elev PASP.   Anxiety    COPD (chronic obstructive pulmonary disease) (HCC)    GERD (gastroesophageal reflux disease)    Hypertension    Hyperthyroidism    Hypokalemia    IgA myeloma (HCC)    Multiple myeloma (HCC)    Pneumonia    Red blood cell antibody positive, compatible PRBC difficult to obtain    S/P autologous bone marrow transplantation [facility])     HEMATOLOGY/ONCOLOGY HISTORY:  Oncology History Overview Note  # SEP 2018- MULTIPLE MYELOMA IgALamda [2.5 gm/dl; K/L= 15/1761]; STAGE III [beta 2 microglobulin=5.5] [presented with acute renal failure; anemia; NO hypercalcemia; Skeletal survey-Normal]; BMBx- 45% plasma cells; FISH-POSITIVE 11:14 translocation.[STANDARD-high RISK]/cyto-Normal; SEP 2018- PET- L3 posterior element lesion.   # 9/14- velcade SQ twice weekly/Dex 40 mg/week; OCT 5th 2018-Start R [10mg ]VD; 3cycles of RVD- PARTAL RESPONSE  #  Jan 11th 2019-Dara-Rev-Dex; April 2019- BMBx- plasma cell -by CD-138/IHC-80% [baseline Sep 2018- 85% ]; HOLD transplant [dw Dr.Gasperatto]  # April 29th 2019 2019- carfil-Cyt-Dex; AUG 6th BMBx- 6% plasma cells; VGPR  # Autologous stem cell transplant on 06/15/18 [[facility]/ Dr.Gasperrato]  # may 1st week-2019- Maintenance Revlimid 10 mg 3w/1w;   FEB 10th 2021- [[facility]]Cellular marrow (50%) with normal trilineage hematopoiesis. No morphologic support for residual myeloma disease. Negative for minimal residual disease by MM-MRD flow cytometry; HOLD REVLIMID [leg swelling]; AUG 2021-PET scan negative for myeloma; continue to hold Revlimid  # MARCH 2021-diastolic congestive heart failure [Dr.End]  # BMBx- OCT 2021- BMBx- 5% plasmacytosis-however this appears to be more polyclonal rather than monoclonal-not explain patient worsening anemia.   # OCT 2022- 18th- Dara- Rev-Dex; MARCH, 2024- PROGRESSION bone marrow biopsy 70% plasma cells; PET scan no bone lesions-however conus medullaris uptake-MRI lumbar spine-   #November 2021-hyperthyroidism/goiter- [D.Bennett/Dr.Solum]-methimazole. S/p Thyroidectomy [Dr.Kim; [facility]-AUG 2022]  --------------------------  # 12/12- RIGHT JUGULAR DVT-x 69m on xarelto; finished April 2020; September 2020-EGD/dysphagia; Dr. Tobi Bastos  # Acute renal failure [Dr.Singh; Proteinuria 1.5gm/day ]; acyclovir/Asprin ------------------------------------------------------------------------------------------------------------   DIAGNOSIS: [ ]  MULTIPLE MYELOMA  STAGE: III/HIGH RISK ;GOALS: CONTROL      Multiple myeloma not having achieved remission  11/21/2017 - 04/28/2018 Chemotherapy   Patient is on Treatment Plan : MYELOMA SALVAGE Cyclophosphamide / Carfilzomib / Dexamethasone (CCd) q28d     05/12/2021 - 02/25/2022 Chemotherapy   Patient is on Treatment Plan : MYELOMA RELAPSED REFRACTORY Daratumumab SQ + Lenalidomide + Dexamethasone (DaraRd) q28d     05/12/2021 - 07/23/2022  Chemotherapy   Patient is on Treatment Plan : MYELOMA RELAPSED REFRACTORY Daratumumab SQ + Lenalidomide + Dexamethasone (DaraRd) q28d  11/02/2022 -  Chemotherapy   Patient is on Treatment Plan : MYELOMA RELAPSED/ REFRACTORY Carfilzomib D1,8,15 (20/27) + Pomalidomide + Dexamethasone (KPd) q28d       ALLERGIES:  has No Known Allergies.  MEDICATIONS:  Current Outpatient Medications  Medication Sig Dispense Refill   acyclovir (ZOVIRAX) 400 MG tablet Take 1 tablet (400 mg total) by mouth 2 (two) times daily. 60 tablet 3   albuterol (VENTOLIN HFA) 108 (90 Base) MCG/ACT inhaler Inhale 2 puffs into the lungs every 6 (six) hours as needed for wheezing or shortness of breath. 8 g 3   amoxicillin (AMOXIL) 500 MG capsule Take 2 capsules (1,000 mg total) by mouth 3 (three) times daily. 42 capsule 1   aspirin EC 81 MG EC tablet Take 1 tablet (81 mg total) by mouth daily. 90 tablet 3   benzonatate (TESSALON) 100 MG capsule Take 1 capsule (100 mg total) by mouth 2 (two) times daily as needed for cough. 40 capsule 0   buPROPion (WELLBUTRIN) 75 MG tablet Take 75 mg by mouth daily.     calcium-vitamin D (OSCAL WITH D) 500MG -200UNIT (5MCG) tablet Take 1 tablet by mouth 2 (two) times daily. 60 tablet 0   carvedilol (COREG) 3.125 MG tablet Take 1 tablet (3.125 mg total) by mouth 2 (two) times daily with a meal. 60 tablet 5   furosemide (LASIX) 40 MG tablet Take 1 tablet (40 mg total) by mouth daily. 90 tablet 3   levothyroxine (SYNTHROID) 100 MCG tablet TAKE 1 TABLET BY MOUTH BEFORE BREAKFAST 90 tablet 0   lidocaine-prilocaine (EMLA) cream Apply 1 application topically as needed. Apply to port and cover with saran wrap 1-2 hours prior to port access 30 g 1   magnesium oxide (MAG-OX) 400 (240 Mg) MG tablet Take 1 tablet (400 mg total) by mouth 2 (two) times daily. 60 tablet 3   montelukast (SINGULAIR) 10 MG tablet Take 1 tablet (10 mg total) by mouth at bedtime. 30 tablet 3   ondansetron (ZOFRAN) 8 MG tablet One  pill every 8 hours as needed for nausea/vomitting. 40 tablet 1   pantoprazole (PROTONIX) 40 MG tablet Take 1 tablet (40 mg total) by mouth daily. 90 tablet 0   polyethylene glycol (MIRALAX / GLYCOLAX) packet Take 17 g by mouth daily as needed for mild constipation. 14 each 0   pomalidomide (POMALYST) 4 MG capsule Take 1 capsule (4 mg total) by mouth daily. Take for 21 days, then old for 7 days. Repeat every 28 days. 21 capsule 0   potassium chloride SA (KLOR-CON M) 20 MEQ tablet Take 1 tablet (20 mEq total) by mouth 2 (two) times daily. 60 tablet 6   spironolactone (ALDACTONE) 25 MG tablet Take 1 tablet by mouth once daily 90 tablet 3   traMADol (ULTRAM) 50 MG tablet Take 1 tablet (50 mg total) by mouth every 12 (twelve) hours as needed. 60 tablet 0   Vitamin D, Ergocalciferol, (DRISDOL) 1.25 MG (50000 UNIT) CAPS capsule Take 1 capsule (50,000 Units total) by mouth once a week. 12 capsule 0   No current facility-administered medications for this visit.   Facility-Administered Medications Ordered in Other Visits  Medication Dose Route Frequency Provider Last Rate Last Admin   0.9 %  sodium chloride infusion   Intravenous Continuous Ralene Muskraturpin, Pamela, PA-C        VITAL SIGNS: There were no vitals taken for this visit. There were no vitals filed for this visit.  Estimated body mass index is 31.61 kg/m as  calculated from the following:   Height as of 10/05/22: 5\' 2"  (1.575 m).   Weight as of 10/12/22: 78.4 kg (172 lb 12.8 oz).  LABS: CBC:    Component Value Date/Time   WBC 6.4 10/12/2022 0940   WBC 6.2 10/05/2022 0811   HGB 8.5 (L) 10/12/2022 0940   HCT 26.8 (L) 10/12/2022 0940   PLT 217 10/12/2022 0940   MCV 95.4 10/12/2022 0940   NEUTROABS 2.1 10/12/2022 0940   LYMPHSABS 3.9 10/12/2022 0940   MONOABS 0.3 10/12/2022 0940   EOSABS 0.1 10/12/2022 0940   BASOSABS 0.0 10/12/2022 0940   Comprehensive Metabolic Panel:    Component Value Date/Time   NA 141 10/12/2022 0940   NA 141  01/25/2020 1530   K 3.2 (L) 10/12/2022 0940   CL 109 10/12/2022 0940   CO2 22 10/12/2022 0940   BUN 10 10/12/2022 0940   BUN 19 01/25/2020 1530   CREATININE 0.80 10/12/2022 0940   GLUCOSE 122 (H) 10/12/2022 0940   CALCIUM 8.5 (L) 10/12/2022 0940   AST 24 10/12/2022 0940   ALT 10 10/12/2022 0940   ALKPHOS 78 10/12/2022 0940   BILITOT 0.4 10/12/2022 0940   PROT 8.2 (H) 10/12/2022 0940   ALBUMIN 3.5 10/12/2022 0940     Present during today's visit: patient and her daughter  Start plan: pomalidomde to start today along with in office treatment   Patient Education I spoke with patient for overview of new oral chemotherapy medication: pomalidomide   Administration: Counseled patient on administration, dosing, side effects, monitoring, drug-food interactions, safe handling, storage, and disposal. Patient will take pomalidomide 1 capsule (4 mg total) by mouth daily. Take for 21 days, then old for 7 days. Repeat every 28 days.  **Patient knows to continue to take her aspirin 81mg  daily.  Side Effects: Side effects include but not limited to: fatigue, constipation or diarrhea, decreased wbc/hgb/plt, nausea.    Drug-drug Interactions (DDI): No relevant DDIs with pomalidomide  Adherence: After discussion with patient no patient barriers to medication adherence identified.  Reviewed with patient importance of keeping a medication schedule and plan for any missed doses.  Ms. Hashimoto voiced understanding and appreciation. All questions answered. Medication handout provided.  Provided patient with Oral Chemotherapy Navigation Clinic phone number. Patient knows to call the office with questions or concerns. Oral Chemotherapy Navigation Clinic will continue to follow.  Patient expressed understanding and was in agreement with this plan. She also understands that She can call clinic at any time with any questions, concerns, or complaints.   Medication Access Issues: No issue patient has  medication in hand and fills at [facility]  Follow-up plan: RTC in one week  Thank you for allowing me to participate in the care of this patient.   Time Total: 20 mins  Visit consisted of counseling and education on dealing with issues of symptom management in the setting of serious and potentially life-threatening illness.Greater than 50%  of this time was spent counseling and coordinating care related to the above assessment and plan.  Signed by: Remi Haggard, PharmD, BCPS, Nolon Bussing, CPP Hematology/Oncology Clinical Pharmacist Practitioner /DB/AP Oral Chemotherapy Navigation Clinic [phone]  11/02/2022 9:31 AM

## 2022-11-02 NOTE — Progress Notes (Signed)
Appetite is good. She brought her new pomalyst tabs with her today. Feels tired once in a while. Denies pain. Patient and her daughter have been unable to get in touch with Dr Ebony Cargo at [facility].

## 2022-11-02 NOTE — Progress Notes (Signed)
[facility] OFFICE PROGRESS NOTE  Patient Care Team: Leanna SatoMiles, Linda M, MD as PCP - General (Family Medicine) End, Cristal Deerhristopher, MD as PCP - Cardiology (Cardiology) Eddie CandleGasparetto, Cristina, [name] Earna CoderBrahmanday, Juanmiguel Defelice R, MD as Consulting Physician (Hematology and Oncology) Vida RiggerAleskerov, Fuad, MD as Consulting Physician (Pulmonary Disease) Tedd SiasSolum, Marlana SalvageAnna M, MD as Consulting Physician (Endocrinology)   Cancer Staging  No matching staging information was found for the patient.  Oncology History Overview Note  # SEP 2018- MULTIPLE MYELOMA IgALamda [2.5 gm/dl; K/L= 16/1096]88/1298]; STAGE III [beta 2 microglobulin=5.5] [presented with acute renal failure; anemia; NO hypercalcemia; Skeletal survey-Normal]; BMBx- 45% plasma cells; FISH-POSITIVE 11:14 translocation.[STANDARD-high RISK]/cyto-Normal; SEP 2018- PET- L3 posterior element lesion.   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 No morphologic support for residual myeloma disease. Negative for minimal residual disease by MM-MRD flow cytometry; HOLD REVLIMID [leg swelling]; AUG 2021-PET scan negative for myeloma; continue to hold Revlimid  # MARCH 2021-diastolic congestive heart failure [Dr.End]  # BMBx- OCT 2021- BMBx- 5% plasmacytosis-however this appears to be more polyclonal rather than monoclonal-not explain patient worsening anemia.   # OCT 2022- 18th- Dara- Rev-Dex; MARCH, 2024- PROGRESSION bone marrow biopsy  70% plasma cells; PET scan no bone lesions-however conus medullaris uptake-MRI lumbar spine-   #November 2021-hyperthyroidism/goiter- [D.Bennett/Dr.Solum]-methimazole. S/p Thyroidectomy [Dr.Kim; [facility]-AUG 2022]  --------------------------  # 12/12- RIGHT JUGULAR DVT-x 7926m on xarelto; finished April 2020; September 2020-EGD/dysphagia; Dr. Tobi BastosAnna  # Acute renal failure [Dr.Singh; Proteinuria 1.5gm/day ]; acyclovir/Asprin ------------------------------------------------------------------------------------------------------------   DIAGNOSIS: [ ]  MULTIPLE MYELOMA  STAGE: III/HIGH RISK ;GOALS: CONTROL      Multiple myeloma not having achieved remission  11/21/2017 - 04/28/2018 Chemotherapy   Patient is on Treatment Plan : MYELOMA SALVAGE Cyclophosphamide / Carfilzomib / Dexamethasone (CCd) q28d     05/12/2021 - 02/25/2022 Chemotherapy   Patient is on Treatment Plan : MYELOMA RELAPSED REFRACTORY Daratumumab SQ + Lenalidomide + Dexamethasone (DaraRd) q28d     05/12/2021 - 07/23/2022 Chemotherapy   Patient is on Treatment Plan : MYELOMA RELAPSED REFRACTORY Daratumumab SQ + Lenalidomide + Dexamethasone (DaraRd) q28d     11/02/2022 -  Chemotherapy   Patient is on Treatment Plan : MYELOMA RELAPSED/ REFRACTORY Carfilzomib D1,8,15 (20/27) + Pomalidomide + Dexamethasone (KPd) q28d      INTERVAL HISTORY: Ambulating independently.  Alone.  Brandi [name] 72 y.o.  female pleasant patient above history of relapsed multiple myeloma--most recently on daratumumab-Revlimid-dexamethasone  is here for follow-up and review results of MRI spine.;  And proceed with Carfil-Pom-Dex.   Patient is currently s/p antibiotics for her septic arthritis.  Patient has not had any further hospitalizations.  Apetite is good. She brought her new pomalyst tabs with her today. Feels tired once in a while. Denies pain. Patient and her daughter have been unable to get in [facility].    Occasional SOBr  with bending that does bother her. Bottom of feet feel like something is stuck to the bottom.    Review of Systems  Constitutional:  Positive for  malaise/fatigue. Negative for chills, diaphoresis and fever.  HENT:  Negative for nosebleeds and sore throat.   Eyes:  Negative for double vision.  Respiratory:  Negative for hemoptysis.   Cardiovascular:  Negative for chest pain, palpitations and orthopnea.  Gastrointestinal:  Negative for abdominal pain, blood in stool, constipation, heartburn and melena.  Genitourinary:  Negative for dysuria, frequency and urgency.  Musculoskeletal:  Positive for back pain.  Skin: Negative.  Negative for itching and rash.  Neurological:  Negative for dizziness, tingling, focal weakness, weakness and headaches.  Endo/Heme/Allergies:  Does not bruise/bleed easily.  Psychiatric/Behavioral:  Negative for depression. The patient is not nervous/anxious.     PAST MEDICAL HISTORY :  Past Medical History:  Diagnosis Date   (HFpEF) heart failure with preserved ejection fraction (HCC)    a. 06/2020 Echo: EF 60-65%, no rwma, nl RV size/fxn. Triv MR. Triv TR/AI. Mod elev PASP.   Anxiety    COPD (chronic obstructive pulmonary disease) (HCC)    GERD (gastroesophageal reflux disease)    Hypertension    Hyperthyroidism    Hypokalemia    IgA myeloma (HCC)    Multiple myeloma (HCC)    Pneumonia    Red blood cell antibody positive, compatible PRBC difficult to obtain    S/P autologous bone marrow transplantation (HCC)     PAST SURGICAL HISTORY :   Past Surgical History:  Procedure Laterality Date   ANTERIOR VITRECTOMY Left 10/07/2021   Procedure: ANTERIOR VITRECTOMY;  Surgeon: Lockie Mola, MD;  Location: [facility];  Service: Ophthalmology;  Laterality: Left;   CATARACT EXTRACTION W/PHACO Left 10/07/2021   Procedure: CATARACT EXTRACTION PHACO AND INTRAOCULAR LENS PLACEMENT (IOC) LEFT VISION BLUE;  Surgeon: Lockie Mola, MD;  Location: [facility];  Service: Ophthalmology;  Laterality: Left;  kit for manual incision 14.56 01:21.4   CATARACT EXTRACTION W/PHACO Right 10/21/2021   Procedure: CATARACT EXTRACTION PHACO AND INTRAOCULAR LENS PLACEMENT (IOC) RIGHT 11.12 01:48.1;  Surgeon: Lockie Mola, MD;  Location: [facility];  Service: Ophthalmology;  Laterality: Right;   ESOPHAGOGASTRODUODENOSCOPY (EGD) WITH PROPOFOL N/A 03/30/2019   Procedure: ESOPHAGOGASTRODUODENOSCOPY (EGD) WITH PROPOFOL;  Surgeon: Wyline Mood, MD;  Location: [facility] END[facility];  Service: Gastroenterology;  Laterality: N/A;   IR FLUORO GUIDE PORT INSERTION RIGHT  12/16/2017   IR IMAGING GUIDED PORT INSERTION  06/15/2021   KNEE ARTHROSCOPY Left 09/03/2022   Procedure: ARTHROSCOPY KNEE DEBRIDEMENT;  Surgeon: Lyndle Herrlich, MD;  Location: [facility] ORS;  Service: Orthopedics;  Laterality: Left;   THYROIDECTOMY N/A     FAMILY HISTORY :   Family History  Problem Relation Age of Onset   Pneumonia Mother    Seizures Father     SOCIAL HISTORY:   Social History   Tobacco Use   Smoking status: Former    Packs/day: .25    Types: Cigarettes    Quit date: 03/18/2021    Years since quitting: 1.6   Smokeless tobacco: Never   Tobacco comments:    2 cigarettes QOD  Vaping Use   Vaping Use: Never used  Substance Use Topics   Alcohol use: No   Drug use: No    ALLERGIES:  has No Known Allergies.  MEDICATIONS:  Current Outpatient Medications  Medication Sig Dispense Refill   acyclovir (ZOVIRAX) 400 MG tablet Take 1 tablet (400 mg total) by mouth 2 (two) times daily. 60 tablet 3   albuterol (VENTOLIN HFA) 108 (90 Base) MCG/ACT inhaler Inhale 2 puffs into the lungs every 6 (six) hours as  needed for wheezing or shortness of breath. 8 g 3   aspirin EC 81 MG EC tablet Take 1 tablet (81 mg total) by mouth daily. 90 tablet 3   calcium-vitamin D (OSCAL WITH D) 500MG -200UNIT ( ) tablet Take 1 tablet by mouth 2 (two) times daily. 60 tablet 0   carvedilol  (COREG) 3.125 MG tablet Take 1 tablet (3.125 mg total) by mouth 2 (two) times daily with a meal. 60 tablet 5   furosemide (LASIX) 40 MG tablet Take 1 tablet (40 mg total) by mouth daily. 90 tablet 3   levothyroxine (SYNTHROID) 100 MCG tablet TAKE 1 TABLET BY MOUTH BEFORE BREAKFAST 90 tablet 0   lidocaine-prilocaine (EMLA) cream Apply 1 application topically as needed. Apply to port and cover with saran wrap 1-2 hours prior to port access 30 g 1   magnesium oxide (MAG-OX) 400 (240 Mg) MG tablet Take 1 tablet (400 mg total) by mouth 2 (two) times daily. 60 tablet 3   montelukast (SINGULAIR) 10 MG tablet Take 1 tablet (10 mg total) by mouth at bedtime. 30 tablet 3   pantoprazole (PROTONIX) 40 MG tablet Take 1 tablet (40 mg total) by mouth daily. 90 tablet 0   polyethylene glycol (MIRALAX / GLYCOLAX) packet Take 17 g by mouth daily as needed for mild constipation. 14 each 0   potassium chloride SA (KLOR-CON M) 20 MEQ tablet Take 1 tablet (20 mEq total) by mouth 2 (two) times daily. 60 tablet 6   spironolactone (ALDACTONE) 25 MG tablet Take 1 tablet by mouth once daily 90 tablet 3   traMADol (ULTRAM) 50 MG tablet Take 1 tablet (50 mg total) by mouth every 12 (twelve) hours as needed. 60 tablet 0   Vitamin D, Ergocalciferol, (DRISDOL) 1.25 MG (50000 UNIT) CAPS capsule Take 1 capsule (50,000 Units total) by mouth once a week. 12 capsule 0   ondansetron (ZOFRAN) 8 MG tablet One pill every 8 hours as needed for nausea/vomitting. (Patient not taking: Reported on 11/02/2022) 40 tablet 1   pomalidomide (POMALYST) 4 MG capsule Take 1 capsule (4 mg total) by mouth daily. Take for 21 days, then old for 7 days. Repeat every 28 days. (Patient not taking: Reported on 11/02/2022) 21 capsule 0   No current facility-administered medications for this visit.   Facility-Administered Medications Ordered in Other Visits  Medication Dose Route Frequency Provider Last Rate Last Admin   0.9 %  sodium chloride infusion    Intravenous Continuous Ralene Muskrat, PA-C       0.9 %  sodium chloride infusion   Intravenous Once Earna Coder, MD       carfilzomib (KYPROLIS) 40 mg in dextrose 5 % 50 mL chemo infusion  20 mg/m2 (Treatment Plan Recorded) Intravenous Once Earna Coder, MD       dexamethasone (DECADRON) 20 mg in sodium chloride 0.9 % 50 mL IVPB  20 mg Intravenous Once Earna Coder, MD 208 mL/hr at 11/02/22 1053 20 mg at 11/02/22 1053   heparin lock flush 100 unit/mL  500 Units Intracatheter Once PRN Earna Coder, MD        PHYSICAL EXAMINATION:   BP 117/73 (BP Location: Left Arm, Patient Position: Sitting)   Pulse 76   Temp 98 F (36.7 C) (Tympanic)   Resp 18   Wt 173 lb (78.5 kg)   BMI 31.64 kg/m   Filed Weights   11/02/22 1008  Weight: 173 lb (78.5 kg)      Patient in wheelchair.  Accompanied by daughter.  Left knee swelling/warmth and erythema noted.  Positive for tenderness.  Physical Exam HENT:     Head: Normocephalic and atraumatic.  Eyes:     Pupils: Pupils are equal, round, and reactive to light.  Cardiovascular:     Rate and Rhythm: Normal rate and regular rhythm.  Pulmonary:     Effort: Pulmonary effort is normal. No respiratory distress.     Breath sounds: Normal breath sounds. No wheezing.  Abdominal:     General: Bowel sounds are normal. There is no distension.     Palpations: Abdomen is soft. There is no mass.     Tenderness: There is no abdominal tenderness. There is no guarding or rebound.  Musculoskeletal:        General: No tenderness. Normal range of motion.     Cervical back: Normal range of motion and neck supple.  Skin:    General: Skin is warm.  Neurological:     Mental Status: She is alert and oriented to person, place, and time.  Psychiatric:        Mood and Affect: Affect normal.    LABORATORY DATA:  I have reviewed the data as listed    Component Value Date/Time   NA 138 11/02/2022 0938   NA 141 01/25/2020 1530    K 3.8 11/02/2022 0938   CL 106 11/02/2022 0938   CO2 22 11/02/2022 0938   GLUCOSE 104 (H) 11/02/2022 0938   BUN 18 11/02/2022 0938   BUN 19 01/25/2020 1530   CREATININE 0.98 11/02/2022 0938   CALCIUM 8.4 (L) 11/02/2022 0938   PROT 8.4 (H) 11/02/2022 0938   ALBUMIN 3.8 11/02/2022 0938   AST 18 11/02/2022 0938   ALT 10 11/02/2022 0938   ALKPHOS 77 11/02/2022 0938   BILITOT 0.3 11/02/2022 0938   GFRNONAA >60 11/02/2022 0938   GFRAA NOT CALCULATED 04/17/2020 1352    No results found for: "SPEP", "UPEP"  Lab Results  Component Value Date   WBC 7.6 11/02/2022   NEUTROABS 2.9 11/02/2022   HGB 8.9 (L) 11/02/2022   HCT 27.9 (L) 11/02/2022   MCV 95.9 11/02/2022   PLT 220 11/02/2022      Chemistry      Component Value Date/Time   NA 138 11/02/2022 0938   NA 141 01/25/2020 1530   K 3.8 11/02/2022 0938   CL 106 11/02/2022 0938   CO2 22 11/02/2022 0938   BUN 18 11/02/2022 0938   BUN 19 01/25/2020 1530   CREATININE 0.98 11/02/2022 0938      Component Value Date/Time   CALCIUM 8.4 (L) 11/02/2022 0938   ALKPHOS 77 11/02/2022 0938   AST 18 11/02/2022 0938   ALT 10 11/02/2022 0938   BILITOT 0.3 11/02/2022 0938      (H): Data is abnormally high   Latest Reference Range & Units Most Recent 12/24/20 14:01 04/01/21 14:34 04/28/21 09:17 06/11/21 09:42 07/03/21 10:32 08/20/21 10:06  M Protein SerPl Elph-Mcnc Not Observed g/dL 1.1 (H) (C) 4/85/46 27:03 Not Observed (C) 0.6 (H) (C) 0.7 (H) (C) 0.5 (H) (C) 0.7 (H) (C) 1.1 (H) (C)    Latest Reference Range & Units Most Recent 09/17/21 08:47 10/15/21 08:25 11/12/21 08:55  Kappa free light chain 3.3 - 19.4 mg/L 5.0 11/12/21 08:55 7.2 7.2 5.0  Lambda free light chains 5.7 - 26.3 mg/L 223.6 (H) 11/12/21 08:55 215.3 (H) 340.2 (H) 223.6 (H)  Kappa, lambda light chain ratio 0.26 - 1.65  0.02 (L) 11/12/21 08:55  0.03 (L) 0.02 (L) 0.02 (L)  (H): Data is abnormally high (L): Data is abnormally low  RADIOGRAPHIC STUDIES: I have personally  reviewed the radiological images as listed and agreed with the findings in the report. No results found.   ASSESSMENT & PLAN:  Multiple myeloma not having achieved remission (HCC) # RECURRENT Multiple myeloma stage III [high-risk cytogenetics-status post KRD-VGPR ; status post autologous stem cell transplant on 06/15/18.  Most recently progressed on salvage therapy with daratumumab  lenalidomide [15 mg 3 w; 1-w] dexamethasone. plasma cell myeloma by approximately 70% of the overall cellular marrow;  FEB 2024-lambda light chain 284 Bone marrow-70% plasma cells. M Protein 0.9-rising lambda light chain ratio. Discontinue Revlimid; and dara-given the progressive disease.  MARCH 2024- PET scan: No skeletal lesions noted.  However cervical spinal cord /conus medullaris-mild uptake noted. ?  Significance-recommend MRI of the lumbar spine with and without contrast for further evaluation.MRI-cervical/thoracic/lumbar spine-negative for any lesions in the spinal cord.  # Recommend start salvage chemotherapy with carfilzomib-dexamethasone [days 08/02/13 every 28 days] and Pomalyst [4 mg-Take 21 days on, 7 days off, repeat every 28 days. ]. Discussed with patient and family.  Also discussed with Dr.Gasperratto.  Consider CAR-T therapy/collection-  MARCH 2024- Left ventricular ejection fraction, by estimation, is 50 to 55%.   # Left knee septic arthritis/immunocompromised host- [s/p Dr.Bowers; ID]; currently-S/p evaluation with ID-s/p antibioics- stable.   # Hypokalemia- continue with Kdur BID- stable.   # Iatrogenic hypothyroidism [ Graves/goiter s/p total thyroidectomy;aug,2022 ]-currently on Synthroid 100 mcg/day-September 2023 TSH normal.  stable.   # Worsening lower back discomfort x 6 months-see above.  MRI lumbar spine as above.  Continue tramadol.  # Chronic diastolic CHF-stable.   # Bone lesions /last zometa on 11/27/2017.    Dental extraction appointment appt-on Jan 26th.  PET scan evidence of any bone  lesions.-Hold Zometa for now. stable.   # Insomnia- recommend restart melatonin 1 to 2 mg nightly as needed.  stable.   # IV access: mediport- functional  # Vaccination: Flu shot s/p;  s/p Pneumonia vaccination [march 2024].   6 w- given dental extrac- on 1/26. :MM panel; K/l light chain ratio q 4 W  # DISPOSITION:  # chemo today. # as per IS-  #1 week- MD; labs- cbc/cmp; hold tube; Carfilzomib-Dex- # 2 week- MD; labs- cbc/cmp;hold tube;  Carfilzomib-Dex- Dr.B      Orders Placed This Encounter  Procedures   CBC with Differential (Cancer Center Only)    Standing Status:   Future    Standing Expiration Date:   11/09/2023   CMP (Cancer Center only)    Standing Status:   Future    Standing Expiration Date:   11/09/2023   CBC with Differential (Cancer Center Only)    Standing Status:   Future    Standing Expiration Date:   11/16/2023   CMP (Cancer Center only)    Standing Status:   Future    Standing Expiration Date:   11/16/2023   Sample to Blood Bank    Standing Status:   Future    Standing Expiration Date:   11/02/2023   Sample to Blood Bank    Standing Status:   Future    Standing Expiration Date:   11/02/2023     All questions were answered. The patient knows to call the clinic with any problems, questions or concerns.      Earna Coder, MD 11/02/2022 11:02 AM

## 2022-11-02 NOTE — Progress Notes (Signed)
Pharmacy only had 30mg  Kyprolis instead of 40mg  - MD ok to give 30mg  today

## 2022-11-02 NOTE — Patient Instructions (Signed)
[facility] [place] [facility]  Discharge Instructions: Thank you for choosing [facility] to provide your oncology and hematology care.  If you have a lab appointment with the Cancer Center, please go directly to the Cancer Center and check in at the registration area.  Wear comfortable clothing and clothing appropriate for easy access to any Portacath or PICC line.   We strive to give you quality time with your provider. You may need to reschedule your appointment if you arrive late (15 or more minutes).  Arriving late affects you and other patients whose appointments are after yours.  Also, if you miss three or more appointments without notifying the office, you may be dismissed from the clinic at the provider's discretion.      For prescription refill requests, have your pharmacy contact our office and allow 72 hours for refills to be completed.    Today you received the following chemotherapy and/or immunotherapy agents Kyprolis      To help prevent nausea and vomiting after your treatment, we encourage you to take your nausea medication as directed.  BELOW ARE SYMPTOMS THAT SHOULD BE REPORTED IMMEDIATELY: *FEVER GREATER THAN 100.4 F (38 C) OR HIGHER *CHILLS OR SWEATING *NAUSEA AND VOMITING THAT IS NOT CONTROLLED WITH YOUR NAUSEA MEDICATION *UNUSUAL SHORTNESS OF BREATH *UNUSUAL BRUISING OR BLEEDING *URINARY PROBLEMS (pain or burning when urinating, or frequent urination) *BOWEL PROBLEMS (unusual diarrhea, constipation, pain near the anus) TENDERNESS IN MOUTH AND THROAT WITH OR WITHOUT PRESENCE OF ULCERS (sore throat, sores in mouth, or a toothache) UNUSUAL RASH, SWELLING OR PAIN  UNUSUAL VAGINAL DISCHARGE OR ITCHING   Items with * indicate a potential emergency and should be followed up as soon as possible or go to the Emergency Department if any problems should occur.  Please show the CHEMOTHERAPY ALERT CARD or IMMUNOTHERAPY ALERT CARD at check-in to  the Emergency Department and triage nurse.  Should you have questions after your visit or need to cancel or reschedule your appointment, please contact [facility] [place] [facility]  [phone] and follow the prompts.  Office hours are 8:00 a.m. to 4:30 p.m. Monday - Friday. Please note that voicemails left after 4:00 p.m. may not be returned until the following business day.  We are closed weekends and major holidays. You have access to a nurse at all times for urgent questions. Please call the main number to the clinic [phone] and follow the prompts.  For any non-urgent questions, you may also contact your provider using MyChart. We now offer e-Visits for anyone 18 and older to request care online for non-urgent symptoms. For details visit [email or web address].   Also download the MyChart app! Go to the app store, search "MyChart", open the app, select [facility], and log in with your MyChart username and password.    

## 2022-11-03 ENCOUNTER — Other Ambulatory Visit: Payer: Self-pay

## 2022-11-09 ENCOUNTER — Inpatient Hospital Stay: Payer: 59

## 2022-11-09 ENCOUNTER — Inpatient Hospital Stay (HOSPITAL_BASED_OUTPATIENT_CLINIC_OR_DEPARTMENT_OTHER): Payer: 59 | Admitting: Internal Medicine

## 2022-11-09 VITALS — BP 145/80 | HR 80 | Temp 99.0°F | Wt 176.0 lb

## 2022-11-09 DIAGNOSIS — C9000 Multiple myeloma not having achieved remission: Secondary | ICD-10-CM

## 2022-11-09 DIAGNOSIS — D649 Anemia, unspecified: Secondary | ICD-10-CM

## 2022-11-09 DIAGNOSIS — Z5112 Encounter for antineoplastic immunotherapy: Secondary | ICD-10-CM | POA: Diagnosis not present

## 2022-11-09 LAB — CMP (CANCER CENTER ONLY)
ALT: 41 U/L (ref 0–44)
AST: 62 U/L — ABNORMAL HIGH (ref 15–41)
Albumin: 3.4 g/dL — ABNORMAL LOW (ref 3.5–5.0)
Alkaline Phosphatase: 84 U/L (ref 38–126)
Anion gap: 10 (ref 5–15)
BUN: 12 mg/dL (ref 8–23)
CO2: 22 mmol/L (ref 22–32)
Calcium: 8 mg/dL — ABNORMAL LOW (ref 8.9–10.3)
Chloride: 107 mmol/L (ref 98–111)
Creatinine: 0.83 mg/dL (ref 0.44–1.00)
GFR, Estimated: >60 mL/min (ref >60)
Glucose, Bld: 102 mg/dL — ABNORMAL HIGH (ref 70–99)
Potassium: 3.2 mmol/L — ABNORMAL LOW (ref 3.5–5.1)
Sodium: 139 mmol/L (ref 135–145)
Total Bilirubin: 0.4 mg/dL (ref 0.3–1.2)
Total Protein: 7.9 g/dL (ref 6.5–8.1)

## 2022-11-09 LAB — CBC WITH DIFFERENTIAL (CANCER CENTER ONLY)
Abs Immature Granulocytes: 0.01 10*3/uL (ref 0.00–0.07)
Basophils Absolute: 0 10*3/uL (ref 0.0–0.1)
Basophils Relative: 0 %
Eosinophils Absolute: 0.1 10*3/uL (ref 0.0–0.5)
Eosinophils Relative: 3 %
HCT: 26.9 % — ABNORMAL LOW (ref 36.0–46.0)
Hemoglobin: 8.5 g/dL — ABNORMAL LOW (ref 12.0–15.0)
Immature Granulocytes: 0 %
Lymphocytes Relative: 51 %
Lymphs Abs: 2.9 10*3/uL (ref 0.7–4.0)
MCH: 30.2 pg (ref 26.0–34.0)
MCHC: 31.6 g/dL (ref 30.0–36.0)
MCV: 95.7 fL (ref 80.0–100.0)
Monocytes Absolute: 0.2 10*3/uL (ref 0.1–1.0)
Monocytes Relative: 4 %
Neutro Abs: 2.4 10*3/uL (ref 1.7–7.7)
Neutrophils Relative %: 42 %
Platelet Count: 185 10*3/uL (ref 150–400)
RBC: 2.81 MIL/uL — ABNORMAL LOW (ref 3.87–5.11)
RDW: 16.8 % — ABNORMAL HIGH (ref 11.5–15.5)
Smear Review: NORMAL
WBC Count: 5.7 10*3/uL (ref 4.0–10.5)
nRBC: 0 % (ref 0.0–0.2)

## 2022-11-09 LAB — SAMPLE TO BLOOD BANK

## 2022-11-09 MED ORDER — TRAMADOL HCL 50 MG PO TABS: 50.0000 mg | ORAL_TABLET | Freq: Two times a day (BID) | ORAL | 0 refills | Status: DC | PRN

## 2022-11-09 MED ORDER — MONTELUKAST SODIUM 10 MG PO TABS: 10.0000 mg | ORAL_TABLET | Freq: Every day | ORAL | 3 refills | Status: DC

## 2022-11-09 MED ORDER — HEPARIN SOD (PORK) LOCK FLUSH 100 UNIT/ML IV SOLN
500.0000 [IU] | Freq: Once | INTRAVENOUS | Status: AC | PRN
  Administered 2022-11-09: 500 [IU]
  Filled 2022-11-09: qty 5

## 2022-11-09 MED ORDER — SODIUM CHLORIDE 0.9 % IV SOLN
20.0000 mg | Freq: Once | INTRAVENOUS | Status: AC
  Administered 2022-11-09: 20 mg via INTRAVENOUS
  Filled 2022-11-09: qty 20

## 2022-11-09 MED ORDER — DEXTROSE 5 % IV SOLN
27.0000 mg/m2 | Freq: Once | INTRAVENOUS | Status: AC
  Administered 2022-11-09: 50 mg via INTRAVENOUS
  Filled 2022-11-09: qty 15

## 2022-11-09 MED ORDER — SODIUM CHLORIDE 0.9 % IV SOLN
Freq: Once | INTRAVENOUS | Status: AC
  Filled 2022-11-09: qty 250

## 2022-11-09 NOTE — Patient Instructions (Signed)
[facility] [place] [facility]  Discharge Instructions: Thank you for choosing [facility] to provide your oncology and hematology care.  If you have a lab appointment with the Cancer Center, please go directly to the Cancer Center and check in at the registration area.  Wear comfortable clothing and clothing appropriate for easy access to any Portacath or PICC line.   We strive to give you quality time with your provider. You may need to reschedule your appointment if you arrive late (15 or more minutes).  Arriving late affects you and other patients whose appointments are after yours.  Also, if you miss three or more appointments without notifying the office, you may be dismissed from the clinic at the provider's discretion.      For prescription refill requests, have your pharmacy contact our office and allow 72 hours for refills to be completed.    Today you received the following chemotherapy and/or immunotherapy agents Kyprolis      To help prevent nausea and vomiting after your treatment, we encourage you to take your nausea medication as directed.  BELOW ARE SYMPTOMS THAT SHOULD BE REPORTED IMMEDIATELY: *FEVER GREATER THAN 100.4 F (38 C) OR HIGHER *CHILLS OR SWEATING *NAUSEA AND VOMITING THAT IS NOT CONTROLLED WITH YOUR NAUSEA MEDICATION *UNUSUAL SHORTNESS OF BREATH *UNUSUAL BRUISING OR BLEEDING *URINARY PROBLEMS (pain or burning when urinating, or frequent urination) *BOWEL PROBLEMS (unusual diarrhea, constipation, pain near the anus) TENDERNESS IN MOUTH AND THROAT WITH OR WITHOUT PRESENCE OF ULCERS (sore throat, sores in mouth, or a toothache) UNUSUAL RASH, SWELLING OR PAIN  UNUSUAL VAGINAL DISCHARGE OR ITCHING   Items with * indicate a potential emergency and should be followed up as soon as possible or go to the Emergency Department if any problems should occur.  Please show the CHEMOTHERAPY ALERT CARD or IMMUNOTHERAPY ALERT CARD at check-in to  the Emergency Department and triage nurse.  Should you have questions after your visit or need to cancel or reschedule your appointment, please contact [facility] [place] [facility]  [phone] and follow the prompts.  Office hours are 8:00 a.m. to 4:30 p.m. Monday - Friday. Please note that voicemails left after 4:00 p.m. may not be returned until the following business day.  We are closed weekends and major holidays. You have access to a nurse at all times for urgent questions. Please call the main number to the clinic [phone] and follow the prompts.  For any non-urgent questions, you may also contact your provider using MyChart. We now offer e-Visits for anyone 18 and older to request care online for non-urgent symptoms. For details visit [email or web address].   Also download the MyChart app! Go to the app store, search "MyChart", open the app, select [facility], and log in with your MyChart username and password.    

## 2022-11-09 NOTE — Progress Notes (Signed)
[facility] OFFICE PROGRESS NOTE  Patient Care Team: Leanna Sato, MD as PCP - General (Family Medicine) End, Cristal Deer, MD as PCP - Cardiology (Cardiology) Eddie Candle, [name] Earna Coder, MD as Consulting Physician (Hematology and Oncology) Vida Rigger, MD as Consulting Physician (Pulmonary Disease) Tedd Sias Marlana Salvage, MD as Consulting Physician (Endocrinology)   Cancer Staging  No matching staging information was found for the patient.  Oncology History Overview Note  # SEP 2018- MULTIPLE MYELOMA IgALamda [2.5 gm/dl; K/L= 29/5621]; STAGE III [beta 2 microglobulin=5.5] [presented with acute renal failure; anemia; NO hypercalcemia; Skeletal survey-Normal]; BMBx- 45% plasma cells; FISH-POSITIVE 11:14 translocation.[STANDARD-high RISK]/cyto-Normal; SEP 2018- PET- L3 posterior element lesion.   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 No morphologic support for residual myeloma disease. Negative for minimal residual disease by MM-MRD flow cytometry; HOLD REVLIMID [leg swelling]; AUG 2021-PET scan negative for myeloma; continue to hold Revlimid  # MARCH 2021-diastolic congestive heart failure [Dr.End]  # BMBx- OCT 2021- BMBx- 5% plasmacytosis-however this appears to be more polyclonal rather than monoclonal-not explain patient worsening anemia.   # OCT 2022- 18th- Dara- Rev-Dex; MARCH, 2024- PROGRESSION bone marrow biopsy  70% plasma cells; PET scan no bone lesions-however conus medullaris uptake-MRI lumbar spine-   #November 2021-hyperthyroidism/goiter- [D.Bennett/Dr.Solum]-methimazole. S/p Thyroidectomy [Dr.Kim; [facility]-AUG 2022]  --------------------------  # 12/12- RIGHT JUGULAR DVT-x 23m on xarelto; finished April 2020; September 2020-EGD/dysphagia; Dr. Tobi Bastos  # Acute renal failure [Dr.Singh; Proteinuria 1.5gm/day ]; acyclovir/Asprin ------------------------------------------------------------------------------------------------------------   DIAGNOSIS:  MULTIPLE MYELOMA  STAGE: III/HIGH RISK ;GOALS: CONTROL      Multiple myeloma not having achieved remission  11/21/2017 - 04/28/2018 Chemotherapy   Patient is on Treatment Plan : MYELOMA SALVAGE Cyclophosphamide / Carfilzomib / Dexamethasone (CCd) q28d     05/12/2021 - 02/25/2022 Chemotherapy   Patient is on Treatment Plan : MYELOMA RELAPSED REFRACTORY Daratumumab SQ + Lenalidomide + Dexamethasone (DaraRd) q28d     05/12/2021 - 07/23/2022 Chemotherapy   Patient is on Treatment Plan : MYELOMA RELAPSED REFRACTORY Daratumumab SQ + Lenalidomide + Dexamethasone (DaraRd) q28d     11/02/2022 -  Chemotherapy   Patient is on Treatment Plan : MYELOMA RELAPSED/ REFRACTORY Carfilzomib D1,8,15 (20/27) + Pomalidomide + Dexamethasone (KPd) q28d      INTERVAL HISTORY: Ambulating independently.  Alone.  Brandi [name] 72 y.o.  female pleasant patient above history of relapsed multiple myeloma--currently on Carfil-Pom-Dex.   Complains of sneezing; and coughing.No dyspnea or swelling in the legs.    Denies pain.  Patient taking tramadol as needed for leg cramps.  Patient unfortunately has not been unable to get in touch with Dr Ebony Cargo at [facility].     Review of Systems  Constitutional:  Positive for malaise/fatigue. Negative for chills, diaphoresis and fever.  HENT:  Negative for nosebleeds and sore throat.   Eyes:  Negative for double vision.  Respiratory:   Negative for hemoptysis.   Cardiovascular:  Negative for chest pain, palpitations and orthopnea.  Gastrointestinal:  Negative for abdominal pain, blood in stool,  constipation, heartburn and melena.  Genitourinary:  Negative for dysuria, frequency and urgency.  Musculoskeletal:  Positive for back pain.  Skin: Negative.  Negative for itching and rash.  Neurological:  Negative for dizziness, tingling, focal weakness, weakness and headaches.  Endo/Heme/Allergies:  Does not bruise/bleed easily.  Psychiatric/Behavioral:  Negative for depression. The patient is not nervous/anxious.     PAST MEDICAL HISTORY :  Past Medical History:  Diagnosis Date   (HFpEF) heart failure with preserved ejection fraction (HCC)    a. 06/2020 Echo: EF 60-65%, no rwma, nl RV size/fxn. Triv MR. Triv TR/AI. Mod elev PASP.   Anxiety    COPD (chronic obstructive pulmonary disease) (HCC)    GERD (gastroesophageal reflux disease)    Hypertension    Hyperthyroidism    Hypokalemia    IgA myeloma (HCC)    Multiple myeloma (HCC)    Pneumonia    Red blood cell antibody positive, compatible PRBC difficult to obtain    S/P autologous bone marrow transplantation (HCC)     PAST SURGICAL HISTORY :   Past Surgical History:  Procedure Laterality Date   ANTERIOR VITRECTOMY Left 10/07/2021   Procedure: ANTERIOR VITRECTOMY;  Surgeon: Lockie Mola, MD;  Location: [facility];  Service: Ophthalmology;  Laterality: Left;   CATARACT EXTRACTION W/PHACO Left 10/07/2021   Procedure: CATARACT EXTRACTION PHACO AND INTRAOCULAR LENS PLACEMENT (IOC) LEFT VISION BLUE;  Surgeon: Lockie Mola, MD;  Location: [facility];  Service: Ophthalmology;  Laterality: Left;  kit for manual incision 14.56 01:21.4   CATARACT EXTRACTION W/PHACO Right 10/21/2021   Procedure: CATARACT EXTRACTION PHACO AND INTRAOCULAR LENS PLACEMENT (IOC) RIGHT 11.12 01:48.1;  Surgeon: Lockie Mola, MD;  Location: [facility];   Service: Ophthalmology;  Laterality: Right;   ESOPHAGOGASTRODUODENOSCOPY (EGD) WITH PROPOFOL N/A 03/30/2019   Procedure: ESOPHAGOGASTRODUODENOSCOPY (EGD) WITH PROPOFOL;  Surgeon: Wyline Mood, MD;  Location: [facility];  Service: Gastroenterology;  Laterality: N/A;   IR FLUORO GUIDE PORT INSERTION RIGHT  12/16/2017   IR IMAGING GUIDED PORT INSERTION  06/15/2021   KNEE ARTHROSCOPY Left 09/03/2022   Procedure: ARTHROSCOPY KNEE DEBRIDEMENT;  Surgeon: Lyndle Herrlich, MD;  Location: [facility];  Service: Orthopedics;  Laterality: Left;   THYROIDECTOMY N/A     FAMILY HISTORY :   Family History  Problem Relation Age of Onset   Pneumonia Mother    Seizures Father     SOCIAL HISTORY:   Social History   Tobacco Use   Smoking status: Former    Packs/day: .25    Types: Cigarettes    Quit date: 03/18/2021    Years since quitting: 1.6   Smokeless tobacco: Never   Tobacco comments:    2 cigarettes QOD  Vaping Use   Vaping Use: Never used  Substance Use Topics   Alcohol use: No   Drug use: No    ALLERGIES:  has No Known Allergies.  MEDICATIONS:  Current Outpatient Medications  Medication Sig Dispense Refill   acyclovir (ZOVIRAX) 400 MG tablet Take 1 tablet (400 mg total) by mouth 2 (two) times daily. 60 tablet 3   albuterol (VENTOLIN HFA) 108 (90 Base) MCG/ACT inhaler Inhale 2 puffs into the lungs every 6 (six) hours as needed for wheezing or shortness of breath. 8 g 3   aspirin EC 81 MG EC tablet Take 1 tablet (81 mg total) by mouth daily. 90 tablet 3   calcium-vitamin D (OSCAL WITH D) 500MG -200UNIT ( ) tablet Take 1 tablet by mouth 2 (two) times daily. 60 tablet  0   carvedilol (COREG) 3.125 MG tablet Take 1 tablet (3.125 mg total) by mouth 2 (two) times daily with a meal. 60 tablet 5   furosemide (LASIX) 40 MG tablet Take 1 tablet (40 mg total) by mouth daily. 90 tablet 3   levothyroxine (SYNTHROID) 100 MCG tablet TAKE 1 TABLET BY MOUTH BEFORE BREAKFAST 90 tablet 0    lidocaine-prilocaine (EMLA) cream Apply 1 application topically as needed. Apply to port and cover with saran wrap 1-2 hours prior to port access 30 g 1   magnesium oxide (MAG-OX) 400 (240 Mg) MG tablet Take 1 tablet (400 mg total) by mouth 2 (two) times daily. 60 tablet 3   montelukast (SINGULAIR) 10 MG tablet Take 1 tablet (10 mg total) by mouth at bedtime. 30 tablet 3   ondansetron (ZOFRAN) 8 MG tablet One pill every 8 hours as needed for nausea/vomitting. (Patient not taking: Reported on 11/02/2022) 40 tablet 1   pantoprazole (PROTONIX) 40 MG tablet Take 1 tablet (40 mg total) by mouth daily. 90 tablet 0   polyethylene glycol (MIRALAX / GLYCOLAX) packet Take 17 g by mouth daily as needed for mild constipation. 14 each 0   pomalidomide (POMALYST) 4 MG capsule Take 1 capsule (4 mg total) by mouth daily. Take for 21 days, then old for 7 days. Repeat every 28 days. (Patient not taking: Reported on 11/02/2022) 21 capsule 0   potassium chloride SA (KLOR-CON M) 20 MEQ tablet Take 1 tablet (20 mEq total) by mouth 2 (two) times daily. 60 tablet 6   spironolactone (ALDACTONE) 25 MG tablet Take 1 tablet by mouth once daily 90 tablet 3   traMADol (ULTRAM) 50 MG tablet Take 1 tablet (50 mg total) by mouth every 12 (twelve) hours as needed. 60 tablet 0   Vitamin D, Ergocalciferol, (DRISDOL) 1.25 MG (50000 UNIT) CAPS capsule Take 1 capsule (50,000 Units total) by mouth once a week. 12 capsule 0   No current facility-administered medications for this visit.   Facility-Administered Medications Ordered in Other Visits  Medication Dose Route Frequency Provider Last Rate Last Admin   0.9 %  sodium chloride infusion   Intravenous Continuous Ralene Muskrat, PA-C       0.9 %  sodium chloride infusion   Intravenous Once Earna Coder, MD       carfilzomib (KYPROLIS) 50 mg in dextrose 5 % 50 mL chemo infusion  27 mg/m2 (Treatment Plan Recorded) Intravenous Once Earna Coder, MD       dexamethasone  (DECADRON) 20 mg in sodium chloride 0.9 % 50 mL IVPB  20 mg Intravenous Once Louretta Shorten R, MD       heparin lock flush 100 unit/mL  500 Units Intracatheter Once PRN Earna Coder, MD        PHYSICAL EXAMINATION:   BP (!) 145/80 (BP Location: Left Arm, Patient Position: Sitting)   Pulse 80   Temp 99 F (37.2 C) (Tympanic)   Wt 176 lb (79.8 kg)   SpO2 99%   BMI 32.19 kg/m   Filed Weights   11/09/22 0918  Weight: 176 lb (79.8 kg)      Patient in wheelchair.  Accompanied by daughter.  Left knee swelling/warmth and erythema noted.  Positive for tenderness.  Physical Exam HENT:     Head: Normocephalic and atraumatic.  Eyes:     Pupils: Pupils are equal, round, and reactive to light.  Cardiovascular:     Rate and Rhythm: Normal rate and regular rhythm.  Pulmonary:     Effort: Pulmonary effort is normal. No respiratory distress.     Breath sounds: Normal breath sounds. No wheezing.  Abdominal:     General: Bowel sounds are normal. There is no distension.     Palpations: Abdomen is soft. There is no mass.     Tenderness: There is no abdominal tenderness. There is no guarding or rebound.  Musculoskeletal:        General: No tenderness. Normal range of motion.     Cervical back: Normal range of motion and neck supple.  Skin:    General: Skin is warm.  Neurological:     Mental Status: She is alert and oriented to person, place, and time.  Psychiatric:        Mood and Affect: Affect normal.    LABORATORY DATA:  I have reviewed the data as listed    Component Value Date/Time   NA 139 11/09/2022 0901   NA 141 01/25/2020 1530   K 3.2 (L) 11/09/2022 0901   CL 107 11/09/2022 0901   CO2 22 11/09/2022 0901   GLUCOSE 102 (H) 11/09/2022 0901   BUN 12 11/09/2022 0901   BUN 19 01/25/2020 1530   CREATININE 0.83 11/09/2022 0901   CALCIUM 8.0 (L) 11/09/2022 0901   PROT 7.9 11/09/2022 0901   ALBUMIN 3.4 (L) 11/09/2022 0901   AST 62 (H) 11/09/2022 0901   ALT 41  11/09/2022 0901   ALKPHOS 84 11/09/2022 0901   BILITOT 0.4 11/09/2022 0901   GFRNONAA >60 11/09/2022 0901   GFRAA NOT CALCULATED 04/17/2020 1352    No results found for: "SPEP", "UPEP"  Lab Results  Component Value Date   WBC 5.7 11/09/2022   NEUTROABS 2.4 11/09/2022   HGB 8.5 (L) 11/09/2022   HCT 26.9 (L) 11/09/2022   MCV 95.7 11/09/2022   PLT 185 11/09/2022      Chemistry      Component Value Date/Time   NA 139 11/09/2022 0901   NA 141 01/25/2020 1530   K 3.2 (L) 11/09/2022 0901   CL 107 11/09/2022 0901   CO2 22 11/09/2022 0901   BUN 12 11/09/2022 0901   BUN 19 01/25/2020 1530   CREATININE 0.83 11/09/2022 0901      Component Value Date/Time   CALCIUM 8.0 (L) 11/09/2022 0901   ALKPHOS 84 11/09/2022 0901   AST 62 (H) 11/09/2022 0901   ALT 41 11/09/2022 0901   BILITOT 0.4 11/09/2022 0901      (H): Data is abnormally high   Latest Reference Range & Units Most Recent 12/24/20 14:01 04/01/21 14:34 04/28/21 09:17 06/11/21 09:42 07/03/21 10:32 08/20/21 10:06  M Protein SerPl Elph-Mcnc Not Observed g/dL 1.1 (H) (C) 1/61/09 60:45 Not Observed (C) 0.6 (H) (C) 0.7 (H) (C) 0.5 (H) (C) 0.7 (H) (C) 1.1 (H) (C)    Latest Reference Range & Units Most Recent 09/17/21 08:47 10/15/21 08:25 11/12/21 08:55  Kappa free light chain 3.3 - 19.4 mg/L 5.0 11/12/21 08:55 7.2 7.2 5.0  Lambda free light chains 5.7 - 26.3 mg/L 223.6 (H) 11/12/21 08:55 215.3 (H) 340.2 (H) 223.6 (H)  Kappa, lambda light chain ratio 0.26 - 1.65  0.02 (L) 11/12/21 08:55 0.03 (L) 0.02 (L) 0.02 (L)  (H): Data is abnormally high (L): Data is abnormally low  RADIOGRAPHIC STUDIES: I have personally reviewed the radiological images as listed and agreed with the findings in the report. No results found.   ASSESSMENT & PLAN:  Multiple myeloma not having achieved remission (  HCC) # RECURRENT Multiple myeloma stage III [high-risk cytogenetics-status post KRD-VGPR ; status post autologous stem cell transplant on  06/15/18.  Most recently progressed on salvage therapy with daratumumab  lenalidomide [15 mg 3 w; 1-w] dexamethasone. plasma cell myeloma by approximately 70% of the overall cellular marrow;  FEB 2024-lambda light chain 284 Bone marrow-70% plasma cells. M Protein 0.9-rising lambda light chain ratio. Discontinue Revlimid; and dara-given the progressive disease.  MARCH 2024- PET scan: No skeletal lesions noted. APRIl 2024- MRI-cervical/thoracic/lumbar spine-negative for any lesions in the spinal cord. ONchemotherapy with carfilzomib-dexamethasone [days 08/02/13 every 28 days] and Pomalyst [4 mg-Take 21 days on, 7 days off, repeat every 28 days. ].   # Proceed with cycle number 1 day 8 chemo today. Labs today reviewed;  acceptable for treatment today.  Also discussed with Dr.Gasperratto.  Consider CAR-T therapy/collection-  MARCH 2024- Left ventricular ejection fraction, by estimation, is 50 to 55%  # Hypokalemia: mild- recommend Kdur TID.   # Seasonal allergies: recommend Singulair; continue 3-4 times a day.  # Left knee septic arthritis/immunocompromised host- [s/p Dr.Bowers; ID]; currently-S/p evaluation with ID-s/p antibioics- continue tramadol. stable;   # Iatrogenic hypothyroidism [ Graves/goiter s/p total thyroidectomy;aug,2022 ]-currently on Synthroid 100 mcg/day-September 2023 TSH normal.  stable;   # Worsening lower back discomfort x 6 months-see above.  MRI lumbar spine as above.  Continue tramadol.stable;   # Chronic diastolic CHFstable;   # Bone lesions /last zometa on 11/27/2017.    Dental extraction appointment appt-on Jan 26th.  PET scan evidence of any bone lesions.-Hold Zometa for now. stable;   # Insomnia- recommend restart melatonin 1 to 2 mg nightly as needed. stable;   # IV access: mediport- functional  # Vaccination: Flu shot s/p;  s/p Pneumonia vaccination [march 2024].   6 w- given dental extrac- on 1/26. :MM panel; K/l light chain ratio q 4 W  # DISPOSITION:  # chemo  today. # as per IS-  #1 week- MD; labs- cbc/cmp; hold tube; Carfilzomib-Dex- # 3 week- MD; labs- cbc/cmp;hold tube;  Carfilzomib-Dex- Dr.B      Orders Placed This Encounter  Procedures   CBC with Differential (Cancer Center Only)    Standing Status:   Future    Standing Expiration Date:   11/30/2023   CMP (Cancer Center only)    Standing Status:   Future    Standing Expiration Date:   11/30/2023   CBC with Differential (Cancer Center Only)    Standing Status:   Standing    Number of Occurrences:   2    Standing Expiration Date:   11/09/2023   CMP (Cancer Center only)    Standing Status:   Standing    Number of Occurrences:   2    Standing Expiration Date:   11/09/2023     All questions were answered. The patient knows to call the clinic with any problems, questions or concerns.      Earna Coder, MD 11/09/2022 10:17 AM

## 2022-11-09 NOTE — Progress Notes (Signed)
Patient here for follow up, she voices no concerns this morning.

## 2022-11-09 NOTE — Assessment & Plan Note (Addendum)
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  Most recently progressed on salvage therapy with daratumumab  lenalidomide [15 mg 3 w; 1-w] dexamethasone. plasma cell myeloma by approximately 70% of the overall cellular marrow;  FEB 2024-lambda light chain 284 Bone marrow-70% plasma cells. M Protein 0.9-rising lambda light chain ratio. Discontinue Revlimid; and dara-given the progressive disease.  MARCH 2024- PET scan: No skeletal lesions noted. APRIl 2024- MRI-cervical/thoracic/lumbar spine-negative for any lesions in the spinal cord. ONchemotherapy with carfilzomib-dexamethasone [days 08/02/13 every 28 days] and Pomalyst [4 mg-Take 21 days on, 7 days off, repeat every 28 days. ].   # Proceed with cycle number 1 day 8 chemo today. Labs today reviewed;  acceptable for treatment today.  Also discussed with Dr.Gasperratto.  Consider CAR-T therapy/collection-  MARCH 2024- Left ventricular ejection fraction, by estimation, is 50 to 55%  # Hypokalemia: mild- recommend Kdur TID.   # Seasonal allergies: recommend Singulair; continue 3-4 times a day.  # Left knee septic arthritis/immunocompromised host- [s/p Dr.Bowers; ID]; currently-S/p evaluation with ID-s/p antibioics- continue tramadol. stable;   # Iatrogenic hypothyroidism [ Graves/goiter s/p total thyroidectomy;aug,2022 ]-currently on Synthroid 100 mcg/day-September 2023 TSH normal.  stable;   # Worsening lower back discomfort x 6 months-see above.  MRI lumbar spine as above.  Continue tramadol.stable;   # Chronic diastolic CHFstable;   # Bone lesions /last zometa on 11/27/2017.    Dental extraction appointment appt-on Jan 26th.  PET scan evidence of any bone lesions.-Hold Zometa for now. stable;   # Insomnia- recommend restart melatonin 1 to 2 mg nightly as needed. stable;   # IV access: mediport- functional  # Vaccination: Flu shot s/p;  s/p Pneumonia  vaccination [march 2024].   6 w- given dental extrac- on 1/26. :MM panel; K/l light chain ratio q 4 W  # DISPOSITION:  # chemo today. # as per IS-  #1 week- MD; labs- cbc/cmp; hold tube; Carfilzomib-Dex- # 3 week- MD; labs- cbc/cmp;hold tube;  Carfilzomib-Dex- Dr.B

## 2022-11-10 ENCOUNTER — Other Ambulatory Visit: Payer: Self-pay

## 2022-11-15 MED FILL — Dexamethasone Sodium Phosphate Inj 100 MG/10ML: INTRAMUSCULAR | Qty: 2 | Status: AC

## 2022-11-16 ENCOUNTER — Inpatient Hospital Stay: Payer: 59

## 2022-11-16 ENCOUNTER — Inpatient Hospital Stay (HOSPITAL_BASED_OUTPATIENT_CLINIC_OR_DEPARTMENT_OTHER): Payer: 59 | Admitting: Internal Medicine

## 2022-11-16 ENCOUNTER — Other Ambulatory Visit: Payer: Self-pay

## 2022-11-16 ENCOUNTER — Encounter: Payer: Self-pay | Admitting: Internal Medicine

## 2022-11-16 VITALS — BP 170/83 | HR 85 | Temp 98.4°F | Ht 62.0 in | Wt 176.2 lb

## 2022-11-16 DIAGNOSIS — C9000 Multiple myeloma not having achieved remission: Secondary | ICD-10-CM

## 2022-11-16 DIAGNOSIS — D649 Anemia, unspecified: Secondary | ICD-10-CM

## 2022-11-16 DIAGNOSIS — R059 Cough, unspecified: Secondary | ICD-10-CM | POA: Diagnosis not present

## 2022-11-16 DIAGNOSIS — Z5112 Encounter for antineoplastic immunotherapy: Secondary | ICD-10-CM | POA: Diagnosis not present

## 2022-11-16 LAB — CMP (CANCER CENTER ONLY)
ALT: 29 U/L (ref 0–44)
AST: 40 U/L (ref 15–41)
Albumin: 3.7 g/dL (ref 3.5–5.0)
Alkaline Phosphatase: 89 U/L (ref 38–126)
Anion gap: 10 (ref 5–15)
BUN: 18 mg/dL (ref 8–23)
CO2: 22 mmol/L (ref 22–32)
Calcium: 9.1 mg/dL (ref 8.9–10.3)
Chloride: 107 mmol/L (ref 98–111)
Creatinine: 0.88 mg/dL (ref 0.44–1.00)
GFR, Estimated: >60 mL/min (ref >60)
Glucose, Bld: 86 mg/dL (ref 70–99)
Potassium: 3.9 mmol/L (ref 3.5–5.1)
Sodium: 139 mmol/L (ref 135–145)
Total Bilirubin: 0.4 mg/dL (ref 0.3–1.2)
Total Protein: 8.5 g/dL — ABNORMAL HIGH (ref 6.5–8.1)

## 2022-11-16 LAB — CBC WITH DIFFERENTIAL (CANCER CENTER ONLY)
Abs Immature Granulocytes: 0.05 10*3/uL (ref 0.00–0.07)
Basophils Absolute: 0 10*3/uL (ref 0.0–0.1)
Basophils Relative: 0 %
Eosinophils Absolute: 0.2 10*3/uL (ref 0.0–0.5)
Eosinophils Relative: 3 %
HCT: 29.1 % — ABNORMAL LOW (ref 36.0–46.0)
Hemoglobin: 9.1 g/dL — ABNORMAL LOW (ref 12.0–15.0)
Immature Granulocytes: 1 %
Lymphocytes Relative: 49 %
Lymphs Abs: 2.8 10*3/uL (ref 0.7–4.0)
MCH: 30.1 pg (ref 26.0–34.0)
MCHC: 31.3 g/dL (ref 30.0–36.0)
MCV: 96.4 fL (ref 80.0–100.0)
Monocytes Absolute: 0.3 10*3/uL (ref 0.1–1.0)
Monocytes Relative: 5 %
Neutro Abs: 2.4 10*3/uL (ref 1.7–7.7)
Neutrophils Relative %: 42 %
Platelet Count: 180 10*3/uL (ref 150–400)
RBC: 3.02 MIL/uL — ABNORMAL LOW (ref 3.87–5.11)
RDW: 16.8 % — ABNORMAL HIGH (ref 11.5–15.5)
WBC Count: 5.6 10*3/uL (ref 4.0–10.5)
nRBC: 0 % (ref 0.0–0.2)

## 2022-11-16 LAB — SAMPLE TO BLOOD BANK

## 2022-11-16 LAB — BRAIN NATRIURETIC PEPTIDE: B Natriuretic Peptide: 47.4 pg/mL (ref 0.0–100.0)

## 2022-11-16 MED ORDER — HEPARIN SOD (PORK) LOCK FLUSH 100 UNIT/ML IV SOLN
250.0000 [IU] | Freq: Once | INTRAVENOUS | Status: DC | PRN
  Filled 2022-11-16: qty 5

## 2022-11-16 MED ORDER — SODIUM CHLORIDE 0.9 % IV SOLN
Freq: Once | INTRAVENOUS | Status: AC
  Filled 2022-11-16: qty 250

## 2022-11-16 MED ORDER — BENZONATATE 100 MG PO CAPS: 100.0000 mg | ORAL_CAPSULE | Freq: Three times a day (TID) | ORAL | 0 refills | Status: DC | PRN

## 2022-11-16 MED ORDER — DEXTROSE 5 % IV SOLN
27.0000 mg/m2 | Freq: Once | INTRAVENOUS | Status: AC
  Administered 2022-11-16: 50 mg via INTRAVENOUS
  Filled 2022-11-16: qty 15

## 2022-11-16 MED ORDER — SODIUM CHLORIDE 0.9 % IV SOLN
20.0000 mg | Freq: Once | INTRAVENOUS | Status: AC
  Administered 2022-11-16: 20 mg via INTRAVENOUS
  Filled 2022-11-16: qty 20

## 2022-11-16 MED ORDER — HEPARIN SOD (PORK) LOCK FLUSH 100 UNIT/ML IV SOLN
500.0000 [IU] | Freq: Once | INTRAVENOUS | Status: AC | PRN
  Administered 2022-11-16: 500 [IU]
  Filled 2022-11-16: qty 5

## 2022-11-16 NOTE — Assessment & Plan Note (Addendum)
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  Most recently progressed on salvage therapy with daratumumab  lenalidomide [15 mg 3 w; 1-w] dexamethasone. plasma cell myeloma by approximately 70% of the overall cellular marrow;  FEB 2024-lambda light chain 284 Bone marrow-70% plasma cells. M Protein 0.9-rising lambda light chain ratio. Discontinue Revlimid; and dara-given the progressive disease.  MARCH 2024- PET scan: No skeletal lesions noted. APRIl 2024- MRI-cervical/thoracic/lumbar spine-negative for any lesions in the spinal cord. ONchemotherapy with carfilzomib-dexamethasone [days 08/02/13 every 28 days] and Pomalyst [4 mg-Take 21 days on, 7 days off, repeat every 28 days. ].   # Proceed with cycle number 1 day 8 chemo today. Labs today reviewed;  acceptable for treatment today.  Also discussed with Dr.Gasperratto.  Consider CAR-T therapy/collection-  MARCH 2024- Left ventricular ejection fraction, by estimation, is 50 to 55%  # Hypokalemia: mild- recommend Kdur TID; hypocalcemia- 8.0- vit D march 2024- 50- recommend ca+vit D BID.   # cough/ mild dyspnea- Seasonal allergies vs others- : continue Singulair/zyrtec; continue abuterol inhlaer  q 2-3 hours. Also continue nebs twice a day. Also check CXR' BNP; tessolon pearls prn.   # Left knee septic arthritis/immunocompromised host- [s/p Dr.Bowers; ID]; currently-S/p evaluation with ID-s/p antibioics- continue tramadol. Stable.   # Iatrogenic hypothyroidism [ Graves/goiter s/p total thyroidectomy;aug,2022 ]-currently on Synthroid 100 mcg/day-September 2023 TSH normal.  Stable.  # Lower back discomfort x 6 months-see above.  MRI lumbar spine as above.  Continue tramadol.stable;   # Bone lesions /last zometa on 11/27/2017.    Dental extraction appointment appt-on Jan 26th.  PET scan evidence of any bone lesions.-Hold Zometa for now. stable;   # Insomnia-  recommend restart melatonin 1 to 2 mg nightly as needed. Stable.  # IV access: mediport- functional  # Vaccination: Flu shot s/p;  s/p Pneumonia vaccination [march 2024].   6 w- given dental extrac- on 1/26. :MM panel; K/l light chain ratio q 4 W  # DISPOSITION:  # add BNP; # ordered CXR # chemo today. # as per IS-  # 2  week- MD; labs- cbc/cmp; hold tube; MM panel; K/L light chains; Carfilzomib-Dex- # 3 week- MD; labs- cbc/cmp;hold tube;  Carfilzomib-Dex- Dr.B

## 2022-11-16 NOTE — Patient Instructions (Signed)

## 2022-11-16 NOTE — Progress Notes (Signed)
[facility] OFFICE PROGRESS NOTE  Patient Care Team: Leanna Sato, MD as PCP - General (Family Medicine) End, Cristal Deer, MD as PCP - Cardiology (Cardiology) Eddie Candle, [name] Earna Coder, MD as Consulting Physician (Hematology and Oncology) Vida Rigger, MD as Consulting Physician (Pulmonary Disease) Tedd Sias Marlana Salvage, MD as Consulting Physician (Endocrinology)   Cancer Staging  No matching staging information was found for the patient.  Oncology History Overview Note  # SEP 2018- MULTIPLE MYELOMA IgALamda [2.5 gm/dl; K/L= 16/1096]; STAGE III [beta 2 microglobulin=5.5] [presented with acute renal failure; anemia; NO hypercalcemia; Skeletal survey-Normal]; BMBx- 45% plasma cells; FISH-POSITIVE 11:14 translocation.[STANDARD-high RISK]/cyto-Normal; SEP 2018- PET- L3 posterior element lesion.   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 No morphologic support for residual myeloma disease. Negative for minimal residual disease by MM-MRD flow cytometry; HOLD REVLIMID [leg swelling]; AUG 2021-PET scan negative for myeloma; continue to hold Revlimid  # MARCH 2021-diastolic congestive heart failure [Dr.End]  # BMBx- OCT 2021- BMBx- 5% plasmacytosis-however this appears to be more polyclonal rather than monoclonal-not explain patient worsening anemia.   # OCT 2022- 18th- Dara- Rev-Dex; MARCH, 2024- PROGRESSION bone marrow biopsy  70% plasma cells; PET scan no bone lesions-however conus medullaris uptake-MRI lumbar spine-   #November 2021-hyperthyroidism/goiter- [D.Bennett/Dr.Solum]-methimazole. S/p Thyroidectomy [Dr.Kim; [facility]-AUG 2022]  --------------------------  # 12/12- RIGHT JUGULAR DVT-x 65m on xarelto; finished April 2020; September 2020-EGD/dysphagia; Dr. Tobi Bastos  # Acute renal failure [Dr.Singh; Proteinuria 1.5gm/day ]; acyclovir/Asprin ------------------------------------------------------------------------------------------------------------   DIAGNOSIS: [ ]  MULTIPLE MYELOMA  STAGE: III/HIGH RISK ;GOALS: CONTROL      Multiple myeloma not having achieved remission  11/21/2017 - 04/28/2018 Chemotherapy   Patient is on Treatment Plan : MYELOMA SALVAGE Cyclophosphamide / Carfilzomib / Dexamethasone (CCd) q28d     05/12/2021 - 02/25/2022 Chemotherapy   Patient is on Treatment Plan : MYELOMA RELAPSED REFRACTORY Daratumumab SQ + Lenalidomide + Dexamethasone (DaraRd) q28d     05/12/2021 - 07/23/2022 Chemotherapy   Patient is on Treatment Plan : MYELOMA RELAPSED REFRACTORY Daratumumab SQ + Lenalidomide + Dexamethasone (DaraRd) q28d     11/02/2022 -  Chemotherapy   Patient is on Treatment Plan : MYELOMA RELAPSED/ REFRACTORY Carfilzomib D1,8,15 (20/27) + Pomalidomide + Dexamethasone (KPd) q28d      INTERVAL HISTORY: Ambulating independently.  Alone.  Brandi [name] 72 y.o.  female pleasant patient above history of relapsed multiple myeloma--currently on Carfil-Pom-Dex.   Complains of sneezing; and coughing. mild dyspnea and cough- on singulair/ claritin.    Denies pain.  Patient taking tramadol as needed for leg cramps.  Patient unfortunately has not been unable to get in touch with Dr Ebony Cargo at [facility].    Review of Systems  Constitutional:  Positive for malaise/fatigue. Negative for chills, diaphoresis and fever.  HENT:  Negative for nosebleeds and sore throat.   Eyes:  Negative for double vision.   Respiratory:  Positive for cough and shortness of breath. Negative for hemoptysis.   Cardiovascular:  Negative for chest pain, palpitations and orthopnea.  Gastrointestinal:  Negative for abdominal pain, blood in stool, constipation, heartburn and melena.  Genitourinary:  Negative for dysuria, frequency and urgency.  Musculoskeletal:  Positive for back pain.  Skin: Negative.  Negative for itching and rash.  Neurological:  Negative for dizziness, tingling, focal weakness, weakness and headaches.  Endo/Heme/Allergies:  Does not bruise/bleed easily.  Psychiatric/Behavioral:  Negative for depression. The patient is not nervous/anxious.     PAST MEDICAL HISTORY :  Past Medical History:  Diagnosis Date   (HFpEF) heart failure with preserved ejection fraction    a. 06/2020 Echo: EF 60-65%, no rwma, nl RV size/fxn. Triv MR. Triv TR/AI. Mod elev PASP.   Anxiety    COPD (chronic obstructive pulmonary disease)    GERD (gastroesophageal reflux disease)    Hypertension    Hyperthyroidism    Hypokalemia    IgA myeloma    Multiple myeloma    Pneumonia    Red blood cell antibody positive, compatible PRBC difficult to obtain    S/P autologous bone marrow transplantation     PAST SURGICAL HISTORY :   Past Surgical History:  Procedure Laterality Date   ANTERIOR VITRECTOMY Left 10/07/2021   Procedure: ANTERIOR VITRECTOMY;  Surgeon: Lockie Mola, MD;  [facility];  Service: Ophthalmology;  Laterality: Left;   CATARACT EXTRACTION W/PHACO Left 10/07/2021   Procedure: CATARACT EXTRACTION PHACO AND INTRAOCULAR LENS PLACEMENT (IOC) LEFT VISION BLUE;  Surgeon: Lockie Mola, MD;  Location: [facility];  Service: Ophthalmology;  Laterality: Left;  kit for manual incision 14.56 01:21.4   CATARACT EXTRACTION W/PHACO Right 10/21/2021   Procedure: CATARACT EXTRACTION PHACO AND INTRAOCULAR LENS PLACEMENT (IOC) RIGHT 11.12 01:48.1;  Surgeon: Lockie Mola, MD;   Location: [facility];  Service: Ophthalmology;  Laterality: Right;   ESOPHAGOGASTRODUODENOSCOPY (EGD) WITH PROPOFOL N/A 03/30/2019   Procedure: ESOPHAGOGASTRODUODENOSCOPY (EGD) WITH PROPOFOL;  Surgeon: Wyline Mood, MD;  Location: [facility] END[facility]COPY;  Service: Gastroenterology;  Laterality: N/A;   IR FLUORO GUIDE PORT INSERTION RIGHT  12/16/2017   IR IMAGING GUIDED PORT INSERTION  06/15/2021   KNEE ARTHROSCOPY Left 09/03/2022   Procedure: ARTHROSCOPY KNEE DEBRIDEMENT;  Surgeon: Lyndle Herrlich, MD;  Location: [facility] ORS;  Service: Orthopedics;  Laterality: Left;   THYROIDECTOMY N/A     FAMILY HISTORY :   Family History  Problem Relation Age of Onset   Pneumonia Mother    Seizures Father     SOCIAL HISTORY:   Social History   Tobacco Use   Smoking status: Former    Packs/day: .25    Types: Cigarettes    Quit date: 03/18/2021    Years since quitting: 1.6   Smokeless tobacco: Never   Tobacco comments:    2 cigarettes QOD  Vaping Use   Vaping Use: Never used  Substance Use Topics   Alcohol use: No   Drug use: No    ALLERGIES:  has No Known Allergies.  MEDICATIONS:  Current Outpatient Medications  Medication Sig Dispense Refill   acyclovir (ZOVIRAX) 400 MG tablet Take 1 tablet (400 mg total) by mouth 2 (two) times daily. 60 tablet 3   albuterol (VENTOLIN HFA) 108 (90 Base) MCG/ACT inhaler Inhale 2 puffs into the lungs every 6 (six) hours as needed for wheezing or shortness of breath. 8 g 3   aspirin EC 81 MG EC tablet Take 1 tablet (81 mg total) by mouth daily. 90 tablet 3   benzonatate (TESSALON) 100 MG capsule Take 1 capsule (100 mg total) by mouth 3 (three) times daily  as needed for cough. 60 capsule 0   calcium-vitamin D (OSCAL WITH D) 500MG -200UNIT ( ) tablet Take 1 tablet by mouth 2 (two) times daily. 60 tablet 0   carvedilol (COREG) 3.125 MG tablet Take 1 tablet (3.125 mg total) by mouth 2 (two) times daily with a meal. 60 tablet 5   furosemide (LASIX) 40 MG tablet  Take 1 tablet (40 mg total) by mouth daily. 90 tablet 3   levothyroxine (SYNTHROID) 100 MCG tablet TAKE 1 TABLET BY MOUTH BEFORE BREAKFAST 90 tablet 0   lidocaine-prilocaine (EMLA) cream Apply 1 application topically as needed. Apply to port and cover with saran wrap 1-2 hours prior to port access 30 g 1   magnesium oxide (MAG-OX) 400 (240 Mg) MG tablet Take 1 tablet (400 mg total) by mouth 2 (two) times daily. 60 tablet 3   montelukast (SINGULAIR) 10 MG tablet Take 1 tablet (10 mg total) by mouth at bedtime. 30 tablet 3   ondansetron (ZOFRAN) 8 MG tablet One pill every 8 hours as needed for nausea/vomitting. 40 tablet 1   polyethylene glycol (MIRALAX / GLYCOLAX) packet Take 17 g by mouth daily as needed for mild constipation. 14 each 0   pomalidomide (POMALYST) 4 MG capsule Take 1 capsule (4 mg total) by mouth daily. Take for 21 days, then old for 7 days. Repeat every 28 days. 21 capsule 0   potassium chloride SA (KLOR-CON M) 20 MEQ tablet Take 1 tablet (20 mEq total) by mouth 2 (two) times daily. 60 tablet 6   spironolactone (ALDACTONE) 25 MG tablet Take 1 tablet by mouth once daily 90 tablet 3   traMADol (ULTRAM) 50 MG tablet Take 1 tablet (50 mg total) by mouth every 12 (twelve) hours as needed. 60 tablet 0   Vitamin D, Ergocalciferol, (DRISDOL) 1.25 MG (50000 UNIT) CAPS capsule Take 1 capsule (50,000 Units total) by mouth once a week. 12 capsule 0   pantoprazole (PROTONIX) 40 MG tablet Take 1 tablet (40 mg total) by mouth daily. 90 tablet 0   No current facility-administered medications for this visit.   Facility-Administered Medications Ordered in Other Visits  Medication Dose Route Frequency Provider Last Rate Last Admin   0.9 %  sodium chloride infusion   Intravenous Continuous Ralene Muskrat, PA-C        PHYSICAL EXAMINATION:   BP (!) 170/83 (BP Location: Right Arm, Patient Position: Sitting, Cuff Size: Large)   Pulse 85   Temp 98.4 F (36.9 C) (Tympanic)   Ht 5\' 2"  (1.575 m)    Wt 176 lb 3.2 oz (79.9 kg)   SpO2 100%   BMI 32.23 kg/m   Filed Weights   11/16/22 0826  Weight: 176 lb 3.2 oz (79.9 kg)   Bilateral coarse breath sounds.   Physical Exam HENT:     Head: Normocephalic and atraumatic.  Eyes:     Pupils: Pupils are equal, round, and reactive to light.  Cardiovascular:     Rate and Rhythm: Normal rate and regular rhythm.  Pulmonary:     Effort: Pulmonary effort is normal. No respiratory distress.     Breath sounds: Normal breath sounds. No wheezing.  Abdominal:     General: Bowel sounds are normal. There is no distension.     Palpations: Abdomen is soft. There is no mass.     Tenderness: There is no abdominal tenderness. There is no guarding or rebound.  Musculoskeletal:        General: No tenderness. Normal range of motion.  Cervical back: Normal range of motion and neck supple.  Skin:    General: Skin is warm.  Neurological:     Mental Status: She is alert and oriented to person, place, and time.  Psychiatric:        Mood and Affect: Affect normal.    LABORATORY DATA:  I have reviewed the data as listed    Component Value Date/Time   NA 139 11/09/2022 0901   NA 141 01/25/2020 1530   K 3.2 (L) 11/09/2022 0901   CL 107 11/09/2022 0901   CO2 22 11/09/2022 0901   GLUCOSE 102 (H) 11/09/2022 0901   BUN 12 11/09/2022 0901   BUN 19 01/25/2020 1530   CREATININE 0.83 11/09/2022 0901   CALCIUM 8.0 (L) 11/09/2022 0901   PROT 7.9 11/09/2022 0901   ALBUMIN 3.4 (L) 11/09/2022 0901   AST 62 (H) 11/09/2022 0901   ALT 41 11/09/2022 0901   ALKPHOS 84 11/09/2022 0901   BILITOT 0.4 11/09/2022 0901   GFRNONAA >60 11/09/2022 0901   GFRAA NOT CALCULATED 04/17/2020 1352    No results found for: "SPEP", "UPEP"  Lab Results  Component Value Date   WBC 5.6 11/16/2022   NEUTROABS 2.4 11/16/2022   HGB 9.1 (L) 11/16/2022   HCT 29.1 (L) 11/16/2022   MCV 96.4 11/16/2022   PLT 180 11/16/2022      Chemistry      Component Value Date/Time    NA 139 11/09/2022 0901   NA 141 01/25/2020 1530   K 3.2 (L) 11/09/2022 0901   CL 107 11/09/2022 0901   CO2 22 11/09/2022 0901   BUN 12 11/09/2022 0901   BUN 19 01/25/2020 1530   CREATININE 0.83 11/09/2022 0901      Component Value Date/Time   CALCIUM 8.0 (L) 11/09/2022 0901   ALKPHOS 84 11/09/2022 0901   AST 62 (H) 11/09/2022 0901   ALT 41 11/09/2022 0901   BILITOT 0.4 11/09/2022 0901      (H): Data is abnormally high   Latest Reference Range & Units Most Recent 12/24/20 14:01 04/01/21 14:34 04/28/21 09:17 06/11/21 09:42 07/03/21 10:32 08/20/21 10:06  M Protein SerPl Elph-Mcnc Not Observed g/dL 1.1 (H) (C) 1/61/09 60:45 Not Observed (C) 0.6 (H) (C) 0.7 (H) (C) 0.5 (H) (C) 0.7 (H) (C) 1.1 (H) (C)    Latest Reference Range & Units Most Recent 09/17/21 08:47 10/15/21 08:25 11/12/21 08:55  Kappa free light chain 3.3 - 19.4 mg/L 5.0 11/12/21 08:55 7.2 7.2 5.0  Lambda free light chains 5.7 - 26.3 mg/L 223.6 (H) 11/12/21 08:55 215.3 (H) 340.2 (H) 223.6 (H)  Kappa, lambda light chain ratio 0.26 - 1.65  0.02 (L) 11/12/21 08:55 0.03 (L) 0.02 (L) 0.02 (L)  (H): Data is abnormally high (L): Data is abnormally low  RADIOGRAPHIC STUDIES: I have personally reviewed the radiological images as listed and agreed with the findings in the report. No results found.   ASSESSMENT & PLAN:  Multiple myeloma not having achieved remission (HCC) # RECURRENT Multiple myeloma stage III [high-risk cytogenetics-status post KRD-VGPR ; status post autologous stem cell transplant on 06/15/18.  Most recently progressed on salvage therapy with daratumumab  lenalidomide [15 mg 3 w; 1-w] dexamethasone. plasma cell myeloma by approximately 70% of the overall cellular marrow;  FEB 2024-lambda light chain 284 Bone marrow-70% plasma cells. M Protein 0.9-rising lambda light chain ratio. Discontinue Revlimid; and dara-given the progressive disease.  MARCH 2024- PET scan: No skeletal lesions noted. APRIl 2024-  MRI-cervical/thoracic/lumbar spine-negative for  any lesions in the spinal cord. ONchemotherapy with carfilzomib-dexamethasone [days 08/02/13 every 28 days] and Pomalyst [4 mg-Take 21 days on, 7 days off, repeat every 28 days. ].   # Proceed with cycle number 1 day 8 chemo today. Labs today reviewed;  acceptable for treatment today.  Also discussed with Dr.Gasperratto.  Consider CAR-T therapy/collection-  MARCH 2024- Left ventricular ejection fraction, by estimation, is 50 to 55%  # Hypokalemia: mild- recommend Kdur TID; hypocalcemia- 8.0- vit D march 2024- 50- recommend ca+vit D BID.   # cough/ mild dyspnea- Seasonal allergies vs others- : continue Singulair/zyrtec; continue abuterol inhlaer  q 2-3 hours. Also continue nebs twice a day. Also check CXR' BNP; tessolon pearls prn.   # Left knee septic arthritis/immunocompromised host- [s/p Dr.Bowers; ID]; currently-S/p evaluation with ID-s/p antibioics- continue tramadol. Stable.   # Iatrogenic hypothyroidism [ Graves/goiter s/p total thyroidectomy;aug,2022 ]-currently on Synthroid 100 mcg/day-September 2023 TSH normal.  Stable.  # Lower back discomfort x 6 months-see above.  MRI lumbar spine as above.  Continue tramadol.stable;   # Bone lesions /last zometa on 11/27/2017.    Dental extraction appointment appt-on Jan 26th.  PET scan evidence of any bone lesions.-Hold Zometa for now. stable;   # Insomnia- recommend restart melatonin 1 to 2 mg nightly as needed. Stable.  # IV access: mediport- functional  # Vaccination: Flu shot s/p;  s/p Pneumonia vaccination [march 2024].   6 w- given dental extrac- on 1/26. :MM panel; K/l light chain ratio q 4 W  # DISPOSITION:  # add BNP; # ordered CXR # chemo today. # as per IS-  # 2  week- MD; labs- cbc/cmp; hold tube; MM panel; K/L light chains; Carfilzomib-Dex- # 3 week- MD; labs- cbc/cmp;hold tube;  Carfilzomib-Dex- Dr.B      Orders Placed This Encounter  Procedures   DG Chest 2 View     Standing Status:   Future    Standing Expiration Date:   11/16/2023    Order Specific Question:   Reason for Exam (SYMPTOM  OR DIAGNOSIS REQUIRED)    Answer:   cough/shortness of breath    Order Specific Question:   Preferred imaging location?    Answer:    [facility]   CBC with Differential (Cancer Center Only)    Standing Status:   Future    Standing Expiration Date:   12/07/2023   CMP (Cancer Center only)    Standing Status:   Future    Standing Expiration Date:   12/07/2023   Brain natriuretic peptide    Standing Status:   Future    Number of Occurrences:   1    Standing Expiration Date:   11/16/2023   Kappa/lambda light chains    Standing Status:   Future    Standing Expiration Date:   11/16/2023   Multiple Myeloma Panel (SPEP&IFE w/QIG)    Standing Status:   Future    Standing Expiration Date:   11/16/2023   Sample to Blood Bank    Standing Status:   Future    Standing Expiration Date:   11/16/2023   Sample to Blood Bank    Standing Status:   Future    Standing Expiration Date:   11/16/2023     All questions were answered. The patient knows to call the clinic with any problems, questions or concerns.      Earna Coder, MD 11/16/2022 9:44 AM

## 2022-11-16 NOTE — Patient Instructions (Signed)
[facility]  Discharge Instructions: Thank you for choosing [facility] to provide your oncology and hematology care.  If you have a lab appointment with the Cancer Center, please go directly to the Cancer Center and check in at the registration area.  Wear comfortable clothing and clothing appropriate for easy access to any Portacath or PICC line.   We strive to give you quality time with your provider. You may need to reschedule your appointment if you arrive late (15 or more minutes).  Arriving late affects you and other patients whose appointments are after yours.  Also, if you miss three or more appointments without notifying the office, you may be dismissed from the clinic at the provider's discretion.      For prescription refill requests, have your pharmacy contact our office and allow 72 hours for refills to be completed.    Today you received the following chemotherapy and/or immunotherapy agents Kyprolis      To help prevent nausea and vomiting after your treatment, we encourage you to take your nausea medication as directed.  BELOW ARE SYMPTOMS THAT SHOULD BE REPORTED IMMEDIATELY: *FEVER GREATER THAN 100.4 F (38 C) OR HIGHER *CHILLS OR SWEATING *NAUSEA AND VOMITING THAT IS NOT CONTROLLED WITH YOUR NAUSEA MEDICATION *UNUSUAL SHORTNESS OF BREATH *UNUSUAL BRUISING OR BLEEDING *URINARY PROBLEMS (pain or burning when urinating, or frequent urination) *BOWEL PROBLEMS (unusual diarrhea, constipation, pain near the anus) TENDERNESS IN MOUTH AND THROAT WITH OR WITHOUT PRESENCE OF ULCERS (sore throat, sores in mouth, or a toothache) UNUSUAL RASH, SWELLING OR PAIN  UNUSUAL VAGINAL DISCHARGE OR ITCHING   Items with * indicate a potential emergency and should be followed up as soon as possible or go to the Emergency Department if any problems should occur.  Please show the CHEMOTHERAPY ALERT CARD or IMMUNOTHERAPY ALERT CARD at check-in to  the Emergency Department and triage nurse.  Should you have questions after your visit or need to cancel or reschedule your appointment, please contact [facility]  [phone] and follow the prompts.  Office hours are 8:00 a.m. to 4:30 p.m. Monday - Friday. Please note that voicemails left after 4:00 p.m. may not be returned until the following business day.  We are closed weekends and major holidays. You have access to a nurse at all times for urgent questions. Please call the main number to the clinic [phone] and follow the prompts.  For any non-urgent questions, you may also contact your provider using MyChart. We now offer e-Visits for anyone 18 and older to request care online for non-urgent symptoms. For details visit [email or web address].   Also download the MyChart app! Go to the app store, search "MyChart", open the app, select [place], and log in with your MyChart username and password.    

## 2022-11-16 NOTE — Progress Notes (Signed)
C/O SOB, but no more than usual.

## 2022-11-17 ENCOUNTER — Other Ambulatory Visit: Payer: Self-pay

## 2022-11-30 ENCOUNTER — Inpatient Hospital Stay: Payer: 59

## 2022-11-30 ENCOUNTER — Inpatient Hospital Stay: Payer: 59 | Attending: Nurse Practitioner | Admitting: Internal Medicine

## 2022-11-30 ENCOUNTER — Ambulatory Visit
Admission: RE | Admit: 2022-11-30 | Discharge: 2022-11-30 | Disposition: A | Discharge status: Home / Self Care | Payer: 59 | Source: Ambulatory Visit | Attending: Internal Medicine | Admitting: Internal Medicine

## 2022-11-30 ENCOUNTER — Ambulatory Visit
Admission: RE | Admit: 2022-11-30 | Discharge: 2022-11-30 | Disposition: A | Discharge status: Home / Self Care | Payer: 59 | Attending: Internal Medicine | Admitting: Internal Medicine

## 2022-11-30 ENCOUNTER — Encounter: Payer: Self-pay | Admitting: Internal Medicine

## 2022-11-30 VITALS — BP 159/74 | HR 49 | Temp 98.4°F | Ht 62.0 in | Wt 180.3 lb

## 2022-11-30 DIAGNOSIS — C9000 Multiple myeloma not having achieved remission: Secondary | ICD-10-CM | POA: Insufficient documentation

## 2022-11-30 DIAGNOSIS — C9002 Multiple myeloma in relapse: Secondary | ICD-10-CM | POA: Diagnosis present

## 2022-11-30 DIAGNOSIS — R059 Cough, unspecified: Secondary | ICD-10-CM

## 2022-11-30 LAB — CMP (CANCER CENTER ONLY)
ALT: 17 U/L (ref 0–44)
AST: 20 U/L (ref 15–41)
Albumin: 3.8 g/dL (ref 3.5–5.0)
Alkaline Phosphatase: 98 U/L (ref 38–126)
Anion gap: 14 (ref 5–15)
BUN: 13 mg/dL (ref 8–23)
CO2: 19 mmol/L — ABNORMAL LOW (ref 22–32)
Calcium: 8.8 mg/dL — ABNORMAL LOW (ref 8.9–10.3)
Chloride: 106 mmol/L (ref 98–111)
Creatinine: 0.98 mg/dL (ref 0.44–1.00)
GFR, Estimated: >60 mL/min (ref >60)
Glucose, Bld: 114 mg/dL — ABNORMAL HIGH (ref 70–99)
Potassium: 3.8 mmol/L (ref 3.5–5.1)
Sodium: 139 mmol/L (ref 135–145)
Total Bilirubin: 0.3 mg/dL (ref 0.3–1.2)
Total Protein: 8.1 g/dL (ref 6.5–8.1)

## 2022-11-30 LAB — CBC WITH DIFFERENTIAL (CANCER CENTER ONLY)
Abs Immature Granulocytes: 0.02 10*3/uL (ref 0.00–0.07)
Basophils Absolute: 0 10*3/uL (ref 0.0–0.1)
Basophils Relative: 0 %
Eosinophils Absolute: 0.3 10*3/uL (ref 0.0–0.5)
Eosinophils Relative: 7 %
HCT: 28.5 % — ABNORMAL LOW (ref 36.0–46.0)
Hemoglobin: 8.9 g/dL — ABNORMAL LOW (ref 12.0–15.0)
Immature Granulocytes: 0 %
Lymphocytes Relative: 55 %
Lymphs Abs: 2.6 10*3/uL (ref 0.7–4.0)
MCH: 30.1 pg (ref 26.0–34.0)
MCHC: 31.2 g/dL (ref 30.0–36.0)
MCV: 96.3 fL (ref 80.0–100.0)
Monocytes Absolute: 0.2 10*3/uL (ref 0.1–1.0)
Monocytes Relative: 4 %
Neutro Abs: 1.6 10*3/uL — ABNORMAL LOW (ref 1.7–7.7)
Neutrophils Relative %: 34 %
Platelet Count: 307 10*3/uL (ref 150–400)
RBC: 2.96 MIL/uL — ABNORMAL LOW (ref 3.87–5.11)
RDW: 16.7 % — ABNORMAL HIGH (ref 11.5–15.5)
Smear Review: NORMAL
WBC Count: 4.8 10*3/uL (ref 4.0–10.5)
nRBC: 0 % (ref 0.0–0.2)

## 2022-11-30 LAB — SAMPLE TO BLOOD BANK

## 2022-11-30 MED ORDER — HEPARIN SOD (PORK) LOCK FLUSH 100 UNIT/ML IV SOLN
500.0000 [IU] | Freq: Once | INTRAVENOUS | Status: AC
  Administered 2022-11-30: 500 [IU] via INTRAVENOUS
  Filled 2022-11-30: qty 5

## 2022-11-30 MED ORDER — METHYLPREDNISOLONE 4 MG PO TBPK: ORAL_TABLET | ORAL | 1 refills | Status: DC

## 2022-11-30 MED ORDER — SODIUM CHLORIDE 0.9% FLUSH
10.0000 mL | INTRAVENOUS | Status: DC | PRN
  Administered 2022-11-30: 10 mL via INTRAVENOUS
  Filled 2022-11-30: qty 10

## 2022-11-30 MED FILL — Dexamethasone Sodium Phosphate Inj 100 MG/10ML: INTRAMUSCULAR | Qty: 2 | Status: AC

## 2022-11-30 NOTE — Progress Notes (Signed)
C/o of sinus symptoms, cough, mucus with some blood, rt ear stopped up, taking nyquil and zyrtec x 3 weeks not getting better.  C/o hot flashes, yesterday she past out, placed O2 on her and a fan and came to, gets hot from waist up.  Would like printed AVS today.

## 2022-11-30 NOTE — Progress Notes (Signed)
[facility] OFFICE PROGRESS NOTE  Patient Care Team: Leanna Sato, MD as PCP - General (Family Medicine) End, Cristal Deer, MD as PCP - Cardiology (Cardiology) Eddie Candle, [name] Earna Coder, MD as Consulting Physician (Hematology and Oncology) Vida Rigger, MD as Consulting Physician (Pulmonary Disease) Tedd Sias Marlana Salvage, MD as Consulting Physician (Endocrinology)   Cancer Staging  No matching staging information was found for the patient.  Oncology History Overview Note  # SEP 2018- MULTIPLE MYELOMA IgALamda [2.5 gm/dl; K/L= 16/1096]; STAGE III [beta 2 microglobulin=5.5] [presented with acute renal failure; anemia; NO hypercalcemia; Skeletal survey-Normal]; BMBx- 45% plasma cells; FISH-POSITIVE 11:14 translocation.[STANDARD-high RISK]/cyto-Normal; SEP 2018- PET- L3 posterior element lesion.   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 No morphologic support for residual myeloma disease. Negative for minimal residual disease by MM-MRD flow cytometry; HOLD REVLIMID [leg swelling]; AUG 2021-PET scan negative for myeloma; continue to hold Revlimid  # MARCH 2021-diastolic congestive heart failure [Dr.End]  # BMBx- OCT 2021- BMBx- 5% plasmacytosis-however this appears to be more polyclonal rather than monoclonal-not explain patient worsening anemia.   # OCT 2022- 18th- Dara- Rev-Dex; MARCH, 2024- PROGRESSION bone marrow biopsy  70% plasma cells; PET scan no bone lesions-however conus medullaris uptake-MRI lumbar spine-   #November 2021-hyperthyroidism/goiter- [D.Bennett/Dr.Solum]-methimazole. S/p Thyroidectomy [Dr.Kim; [facility]-AUG 2022]  --------------------------  # 12/12- RIGHT JUGULAR DVT-x 45m on xarelto; finished April 2020; September 2020-EGD/dysphagia; Dr. Tobi Bastos  # Acute renal failure [Dr.Singh; Proteinuria 1.5gm/day ]; acyclovir/Asprin ------------------------------------------------------------------------------------------------------------   DIAGNOSIS: [ ]  MULTIPLE MYELOMA  STAGE: III/HIGH RISK ;GOALS: CONTROL      Multiple myeloma not having achieved remission (HCC)  11/21/2017 - 04/28/2018 Chemotherapy   Patient is on Treatment Plan : MYELOMA SALVAGE Cyclophosphamide / Carfilzomib / Dexamethasone (CCd) q28d     05/12/2021 - 02/25/2022 Chemotherapy   Patient is on Treatment Plan : MYELOMA RELAPSED REFRACTORY Daratumumab SQ + Lenalidomide + Dexamethasone (DaraRd) q28d     05/12/2021 - 07/23/2022 Chemotherapy   Patient is on Treatment Plan : MYELOMA RELAPSED REFRACTORY Daratumumab SQ + Lenalidomide + Dexamethasone (DaraRd) q28d     11/02/2022 -  Chemotherapy   Patient is on Treatment Plan : MYELOMA RELAPSED/ REFRACTORY Carfilzomib D1,8,15 (20/27) + Pomalidomide + Dexamethasone (KPd) q28d      INTERVAL HISTORY: Ambulating independently.  Alone.  Brandi [name] 72 y.o.  female pleasant patient above history of relapsed multiple myeloma--currently on Carfil-Pom-Dex.   S/p evaluation with Dr Ebony Cargo at [facility].   Patient complains of of sinus symptoms, cough, mucus with some blood, rt ear stopped up, taking nyquil and zyrtec x 3 weeks not getting better.  Unfortunately patient has not taken any of her recommended measures discussed at last visit.   Also complains of c/o hot flashes, yesterday she pre-syncope yesterday-  placed O2. Back to baseline.    Denies any worsening pain.  Patient taking  tramadol as needed for leg cramps.  Patient unfortunately has not been unable to get in touch with    Review of Systems  Constitutional:  Positive for malaise/fatigue. Negative for chills, diaphoresis and fever.  HENT:  Negative for nosebleeds and sore throat.   Eyes:  Negative for double vision.  Respiratory:  Positive for cough and shortness of breath. Negative for hemoptysis.   Cardiovascular:  Negative for chest pain, palpitations and orthopnea.  Gastrointestinal:  Negative for abdominal pain, blood in stool, constipation, heartburn and melena.  Genitourinary:  Negative for dysuria, frequency and urgency.  Musculoskeletal:  Positive for back pain.  Skin: Negative.  Negative for itching and rash.  Neurological:  Negative for dizziness, tingling, focal weakness, weakness and headaches.  Endo/Heme/Allergies:  Does not bruise/bleed easily.  Psychiatric/Behavioral:  Negative for depression. The patient is not nervous/anxious.     PAST MEDICAL HISTORY :  Past Medical History:  Diagnosis Date   (HFpEF) heart failure with preserved ejection fraction (HCC)    a. 06/2020 Echo: EF 60-65%, no rwma, nl RV size/fxn. Triv MR. Triv TR/AI. Mod elev PASP.   Anxiety    COPD (chronic obstructive pulmonary disease) (HCC)    GERD (gastroesophageal reflux disease)    Hypertension    Hyperthyroidism    Hypokalemia    IgA myeloma (HCC)    Multiple myeloma (HCC)    Pneumonia    Red blood cell antibody positive, compatible PRBC difficult to obtain    S/P autologous bone marrow transplantation (HCC)     PAST SURGICAL HISTORY :   Past Surgical History:  Procedure Laterality Date   ANTERIOR VITRECTOMY Left 10/07/2021   Procedure: ANTERIOR VITRECTOMY;  Surgeon: Lockie Mola, MD;  Location[facility];  Service: Ophthalmology;  Laterality: Left;   CATARACT EXTRACTION W/PHACO Left 10/07/2021   Procedure: CATARACT EXTRACTION PHACO AND INTRAOCULAR LENS PLACEMENT (IOC) LEFT VISION BLUE;   Surgeon: Lockie Mola, MD;  Location: [facility];  Service: Ophthalmology;  Laterality: Left;  kit for manual incision 14.56 01:21.4   CATARACT EXTRACTION W/PHACO Right 10/21/2021   Procedure: CATARACT EXTRACTION PHACO AND INTRAOCULAR LENS PLACEMENT (IOC) RIGHT 11.12 01:48.1;  Surgeon: Lockie Mola, MD;  Location: [facility];  Service: Ophthalmology;  Laterality: Right;   ESOPHAGOGASTRODUODENOSCOPY (EGD) WITH PROPOFOL N/A 03/30/2019   Procedure: ESOPHAGOGASTRODUODENOSCOPY (EGD) WITH PROPOFOL;  Surgeon: Wyline Mood, MD;  Location[facility];  Service: Gastroenterology;  Laterality: N/A;   IR FLUORO GUIDE PORT INSERTION RIGHT  12/16/2017   IR IMAGING GUIDED PORT INSERTION  06/15/2021   KNEE ARTHROSCOPY Left 09/03/2022   Procedure: ARTHROSCOPY KNEE DEBRIDEMENT;  Surgeon: Lyndle Herrlich, MD;  Location: [facility] ORS;  Service: Orthopedics;  Laterality: Left;   THYROIDECTOMY N/A     FAMILY HISTORY :   Family History  Problem Relation Age of Onset   Pneumonia Mother    Seizures Father     SOCIAL HISTORY:   Social History   Tobacco Use   Smoking status: Former    Packs/day: .25    Types: Cigarettes    Quit date: 03/18/2021    Years since quitting: 1.7   Smokeless tobacco: Never   Tobacco comments:    2 cigarettes QOD  Vaping Use   Vaping Use: Never used  Substance Use Topics   Alcohol use: No   Drug use: No    ALLERGIES:  has No Known Allergies.  MEDICATIONS:  Current Outpatient Medications  Medication Sig Dispense Refill   acyclovir (ZOVIRAX) 400 MG tablet Take 1 tablet (400 mg total) by mouth 2 (two) times daily. 60 tablet 3   albuterol (VENTOLIN HFA) 108 (90 Base) MCG/ACT inhaler Inhale 2  puffs into the lungs every 6 (six) hours as needed for wheezing or shortness of breath. 8 g 3   aspirin EC 81 MG EC tablet Take 1 tablet (81 mg total) by mouth daily. 90 tablet 3   benzonatate (TESSALON) 100 MG capsule Take 1 capsule (100 mg total) by mouth  3 (three) times daily as needed for cough. 60 capsule 0   calcium-vitamin D (OSCAL WITH D) 500MG -200UNIT ( ) tablet Take 1 tablet by mouth 2 (two) times daily. 60 tablet 0   carvedilol (COREG) 3.125 MG tablet Take 1 tablet (3.125 mg total) by mouth 2 (two) times daily with a meal. 60 tablet 5   furosemide (LASIX) 40 MG tablet Take 1 tablet (40 mg total) by mouth daily. 90 tablet 3   levothyroxine (SYNTHROID) 100 MCG tablet TAKE 1 TABLET BY MOUTH BEFORE BREAKFAST 90 tablet 0   lidocaine-prilocaine (EMLA) cream Apply 1 application topically as needed. Apply to port and cover with saran wrap 1-2 hours prior to port access 30 g 1   magnesium oxide (MAG-OX) 400 (240 Mg) MG tablet Take 1 tablet (400 mg total) by mouth 2 (two) times daily. 60 tablet 3   methylPREDNISolone (MEDROL DOSEPAK) 4 MG TBPK tablet Use as directed. 21 tablet 1   montelukast (SINGULAIR) 10 MG tablet Take 1 tablet (10 mg total) by mouth at bedtime. 30 tablet 3   ondansetron (ZOFRAN) 8 MG tablet One pill every 8 hours as needed for nausea/vomitting. 40 tablet 1   polyethylene glycol (MIRALAX / GLYCOLAX) packet Take 17 g by mouth daily as needed for mild constipation. 14 each 0   pomalidomide (POMALYST) 4 MG capsule Take 1 capsule (4 mg total) by mouth daily. Take for 21 days, then old for 7 days. Repeat every 28 days. 21 capsule 0   potassium chloride SA (KLOR-CON M) 20 MEQ tablet Take 1 tablet (20 mEq total) by mouth 2 (two) times daily. 60 tablet 6   spironolactone (ALDACTONE) 25 MG tablet Take 1 tablet by mouth once daily 90 tablet 3   traMADol (ULTRAM) 50 MG tablet Take 1 tablet (50 mg total) by mouth every 12 (twelve) hours as needed. 60 tablet 0   Vitamin D, Ergocalciferol, (DRISDOL) 1.25 MG (50000 UNIT) CAPS capsule Take 1 capsule (50,000 Units total) by mouth once a week. 12 capsule 0   pantoprazole (PROTONIX) 40 MG tablet Take 1 tablet (40 mg total) by mouth daily. 90 tablet 0   No current facility-administered medications  for this visit.   Facility-Administered Medications Ordered in Other Visits  Medication Dose Route Frequency Provider Last Rate Last Admin   0.9 %  sodium chloride infusion   Intravenous Continuous Ralene Muskrat, PA-C        PHYSICAL EXAMINATION:   BP (!) 159/74 (BP Location: Left Arm, Patient Position: Sitting, Cuff Size: Normal)   Pulse (!) 49   Temp 98.4 F (36.9 C) (Tympanic)   Ht 5\' 2"  (1.575 m)   Wt 180 lb 4.8 oz (81.8 kg)   SpO2 97%   BMI 32.98 kg/m   Filed Weights   11/30/22 0927  Weight: 180 lb 4.8 oz (81.8 kg)   Bilateral coarse breath sounds.   Physical Exam HENT:     Head: Normocephalic and atraumatic.  Eyes:     Pupils: Pupils are equal, round, and reactive to light.  Cardiovascular:     Rate and Rhythm: Normal rate and regular rhythm.  Pulmonary:     Effort: Pulmonary effort is  normal. No respiratory distress.     Breath sounds: Normal breath sounds. No wheezing.  Abdominal:     General: Bowel sounds are normal. There is no distension.     Palpations: Abdomen is soft. There is no mass.     Tenderness: There is no abdominal tenderness. There is no guarding or rebound.  Musculoskeletal:        General: No tenderness. Normal range of motion.     Cervical back: Normal range of motion and neck supple.  Skin:    General: Skin is warm.  Neurological:     Mental Status: She is alert and oriented to person, place, and time.  Psychiatric:        Mood and Affect: Affect normal.    LABORATORY DATA:  I have reviewed the data as listed    Component Value Date/Time   NA 139 11/30/2022 0933   NA 141 01/25/2020 1530   K 3.8 11/30/2022 0933   CL 106 11/30/2022 0933   CO2 19 (L) 11/30/2022 0933   GLUCOSE 114 (H) 11/30/2022 0933   BUN 13 11/30/2022 0933   BUN 19 01/25/2020 1530   CREATININE 0.98 11/30/2022 0933   CALCIUM 8.8 (L) 11/30/2022 0933   PROT 8.1 11/30/2022 0933   ALBUMIN 3.8 11/30/2022 0933   AST 20 11/30/2022 0933   ALT 17 11/30/2022 0933    ALKPHOS 98 11/30/2022 0933   BILITOT 0.3 11/30/2022 0933   GFRNONAA >60 11/30/2022 0933   GFRAA NOT CALCULATED 04/17/2020 1352    No results found for: "SPEP", "UPEP"  Lab Results  Component Value Date   WBC 4.8 11/30/2022   NEUTROABS 1.6 (L) 11/30/2022   HGB 8.9 (L) 11/30/2022   HCT 28.5 (L) 11/30/2022   MCV 96.3 11/30/2022   PLT 307 11/30/2022      Chemistry      Component Value Date/Time   NA 139 11/30/2022 0933   NA 141 01/25/2020 1530   K 3.8 11/30/2022 0933   CL 106 11/30/2022 0933   CO2 19 (L) 11/30/2022 0933   BUN 13 11/30/2022 0933   BUN 19 01/25/2020 1530   CREATININE 0.98 11/30/2022 0933      Component Value Date/Time   CALCIUM 8.8 (L) 11/30/2022 0933   ALKPHOS 98 11/30/2022 0933   AST 20 11/30/2022 0933   ALT 17 11/30/2022 0933   BILITOT 0.3 11/30/2022 0933      (H): Data is abnormally high   Latest Reference Range & Units Most Recent 12/24/20 14:01 04/01/21 14:34 04/28/21 09:17 06/11/21 09:42 07/03/21 10:32 08/20/21 10:06  M Protein SerPl Elph-Mcnc Not Observed g/dL 1.1 (H) (C) 09/07/06 65:78 Not Observed (C) 0.6 (H) (C) 0.7 (H) (C) 0.5 (H) (C) 0.7 (H) (C) 1.1 (H) (C)    Latest Reference Range & Units Most Recent 09/17/21 08:47 10/15/21 08:25 11/12/21 08:55  Kappa free light chain 3.3 - 19.4 mg/L 5.0 11/12/21 08:55 7.2 7.2 5.0  Lambda free light chains 5.7 - 26.3 mg/L 223.6 (H) 11/12/21 08:55 215.3 (H) 340.2 (H) 223.6 (H)  Kappa, lambda light chain ratio 0.26 - 1.65  0.02 (L) 11/12/21 08:55 0.03 (L) 0.02 (L) 0.02 (L)  (H): Data is abnormally high (L): Data is abnormally low  RADIOGRAPHIC STUDIES: I have personally reviewed the radiological images as listed and agreed with the findings in the report. DG Chest 2 View  Result Date: 11/30/2022 CLINICAL DATA:  Productive cough, shortness of breath. EXAM: CHEST - 2 VIEW COMPARISON:  September 06, 2022.  FINDINGS: Stable cardiomediastinal silhouette. Right internal jugular Port-A-Cath is unchanged in  position. Hypoinflation of the lungs is noted with minimal bibasilar subsegmental atelectasis. Bony thorax is unremarkable. IMPRESSION: Hypoinflation of the lungs with minimal bibasilar subsegmental atelectasis. Electronically Signed   By: Lupita Raider M.D.   On: 11/30/2022 12:07     ASSESSMENT & PLAN:  Multiple myeloma not having achieved remission (HCC) # RECURRENT Multiple myeloma stage III [high-risk cytogenetics-status post KRD-VGPR ; status post autologous stem cell transplant on 06/15/18.  Most recently progressed on salvage therapy with daratumumab  lenalidomide [15 mg 3 w; 1-w] dexamethasone. plasma cell myeloma by approximately 70% of the overall cellular marrow;  FEB 2024-lambda light chain 284 Bone marrow-70% plasma cells. M Protein 0.9-rising lambda light chain ratio. Discontinue Revlimid; and dara-given the progressive disease.  MARCH 2024- PET scan: No skeletal lesions noted. APRIl 2024- MRI-cervical/thoracic/lumbar spine-negative for any lesions in the spinal cord. ONchemotherapy with carfilzomib-dexamethasone [days 08/02/13 every 28 days] and Pomalyst [4 mg-Take 21 days on, 7 days off, repeat every 28 days. ].   # HOLD with cycle cycle number 2-day 1 chemotherapy today. HOLD pomalyst-until next visit. SEE BELOW [acute bronchitis] Labs today reviewed; reviewed the note from Dr.Gasperratto.  Consider CAR-T therapy/collection-  MARCH 2024- Left ventricular ejection fraction, by estimation, is 50 to 55%  # Hypokalemia: mild- recommend Kdur TID; hypocalcemia- 8.0- vit D march 2024- 50- recommend ca+vit D BID.   # cough/ mild dyspnea- Seasonal allergies vs others-acute bronchitis/pneumonia continue Singulair/zyrtec; continue abuterol inhlaer  q 2-3 hours. Also continue nebs twice a day. Also check CXR; BNP-WNL; tessolon pearls prn. Again re-iterated the importance of above measures. Also start medrol dose pack.  Hold off antibiotic at this time.  # Left knee septic arthritis/immunocompromised  host- [s/p Dr.Bowers; ID]; currently-S/p evaluation with ID-s/p antibioics- continue tramadol. Stable.   # Iatrogenic hypothyroidism [ Graves/goiter s/p total thyroidectomy;aug,2022 ]-currently on Synthroid 100 mcg/day-September 2023 TSH normal.  Stable.  # Lower back discomfort x 6 months-see above.  MRI lumbar spine as above.  Continue tramadol. Stable.  # Bone lesions /last zometa on 11/27/2017.    Dental extraction appointment appt-on Jan 26th.  PET scan evidence of any bone lesions.-Hold Zometa for now. Stable.  # Insomnia- recommend restart melatonin 1 to 2 mg nightly as needed. Stable.  # IV access: mediport- functional  # Vaccination: Flu shot s/p;  s/p Pneumonia vaccination [march 2024].   6 w- given dental extrac- on 1/26. :MM panel; K/l light chain ratio q 4 W  # DISPOSITION:   # ordered CXR # HOLD chemo today; this week # as per IS-  # 1 week- MD; labs- cbc/cmp; hold tube; Carfilzomib-Dex- # 2  week- MD; labs- cbc/cmp;hold tube;  Carfilzomib-Dex- Dr.B  Addendum: Chest x-ray negative for pneumonia.  Proceed with above recommendations/symptom management.  Follow-up as planned.  Patient informed.      Orders Placed This Encounter  Procedures   CBC with Differential (Cancer Center Only)    Standing Status:   Future    Standing Expiration Date:   12/07/2023   CMP (Cancer Center only)    Standing Status:   Future    Standing Expiration Date:   12/07/2023   Sample to Blood Bank    Standing Status:   Future    Standing Expiration Date:   11/30/2023     All questions were answered. The patient knows to call the clinic with any problems, questions or concerns.  Earna Coder, MD 11/30/2022 1:01 PM

## 2022-11-30 NOTE — Assessment & Plan Note (Addendum)
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  Most recently progressed on salvage therapy with daratumumab  lenalidomide [15 mg 3 w; 1-w] dexamethasone. plasma cell myeloma by approximately 70% of the overall cellular marrow;  FEB 2024-lambda light chain 284 Bone marrow-70% plasma cells. M Protein 0.9-rising lambda light chain ratio. Discontinue Revlimid; and dara-given the progressive disease.  MARCH 2024- PET scan: No skeletal lesions noted. APRIl 2024- MRI-cervical/thoracic/lumbar spine-negative for any lesions in the spinal cord. ONchemotherapy with carfilzomib-dexamethasone [days 08/02/13 every 28 days] and Pomalyst [4 mg-Take 21 days on, 7 days off, repeat every 28 days. ].   # HOLD with cycle cycle number 2-day 1 chemotherapy today. HOLD pomalyst-until next visit. SEE BELOW [acute bronchitis] Labs today reviewed; reviewed the note from Dr.Gasperratto.  Consider CAR-T therapy/collection-  MARCH 2024- Left ventricular ejection fraction, by estimation, is 50 to 55%  # Hypokalemia: mild- recommend Kdur TID; hypocalcemia- 8.0- vit D march 2024- 50- recommend ca+vit D BID.   # cough/ mild dyspnea- Seasonal allergies vs others-acute bronchitis/pneumonia continue Singulair/zyrtec; continue abuterol inhlaer  q 2-3 hours. Also continue nebs twice a day. Also check CXR; BNP-WNL; tessolon pearls prn. Again re-iterated the importance of above measures. Also start medrol dose pack.  Hold off antibiotic at this time.  # Left knee septic arthritis/immunocompromised host- [s/p Dr.Bowers; ID]; currently-S/p evaluation with ID-s/p antibioics- continue tramadol. Stable.   # Iatrogenic hypothyroidism [ Graves/goiter s/p total thyroidectomy;aug,2022 ]-currently on Synthroid 100 mcg/day-September 2023 TSH normal.  Stable.  # Lower back discomfort x 6 months-see above.  MRI lumbar spine as above.  Continue tramadol.  Stable.  # Bone lesions /last zometa on 11/27/2017.    Dental extraction appointment appt-on Jan 26th.  PET scan evidence of any bone lesions.-Hold Zometa for now. Stable.  # Insomnia- recommend restart melatonin 1 to 2 mg nightly as needed. Stable.  # IV access: mediport- functional  # Vaccination: Flu shot s/p;  s/p Pneumonia vaccination [march 2024].   6 w- given dental extrac- on 1/26. :MM panel; K/l light chain ratio q 4 W  # DISPOSITION:   # ordered CXR # HOLD chemo today; this week # as per IS-  # 1 week- MD; labs- cbc/cmp; hold tube; Carfilzomib-Dex- # 2  week- MD; labs- cbc/cmp;hold tube;  Carfilzomib-Dex- Dr.B  Addendum: Chest x-ray negative for pneumonia.  Proceed with above recommendations/symptom management.  Follow-up as planned.  Patient informed.

## 2022-11-30 NOTE — Patient Instructions (Addendum)
#   Recommend Singulair as planned  # Please get a chest x-ray as recommended  # Also start Medrol Dosepak as recommended.  # Recommend using the nebulizer 3 times a day.  # Do not take Pomalyst until next visit.

## 2022-12-01 ENCOUNTER — Other Ambulatory Visit: Payer: Self-pay

## 2022-12-01 LAB — KAPPA/LAMBDA LIGHT CHAINS
Kappa free light chain: 6.9 mg/L (ref 3.3–19.4)
Kappa, lambda light chain ratio: 0.04 — ABNORMAL LOW (ref 0.26–1.65)
Lambda free light chains: 192.2 mg/L — ABNORMAL HIGH (ref 5.7–26.3)

## 2022-12-05 LAB — MULTIPLE MYELOMA PANEL, SERUM
Albumin SerPl Elph-Mcnc: 3.5 g/dL (ref 2.9–4.4)
Albumin/Glob SerPl: 0.9 (ref 0.7–1.7)
Alpha 1: 0.2 g/dL (ref 0.0–0.4)
Alpha2 Glob SerPl Elph-Mcnc: 0.8 g/dL (ref 0.4–1.0)
B-Globulin SerPl Elph-Mcnc: 1.5 g/dL — ABNORMAL HIGH (ref 0.7–1.3)
Gamma Glob SerPl Elph-Mcnc: 1.6 g/dL (ref 0.4–1.8)
Globulin, Total: 4.1 g/dL — ABNORMAL HIGH (ref 2.2–3.9)
IgA: 1204 mg/dL — ABNORMAL HIGH (ref 87–352)
IgG (Immunoglobin G), Serum: 338 mg/dL — ABNORMAL LOW (ref 586–1602)
IgM (Immunoglobulin M), Srm: 9 mg/dL — ABNORMAL LOW (ref 26–217)
M Protein SerPl Elph-Mcnc: 1.3 g/dL — ABNORMAL HIGH
Total Protein ELP: 7.6 g/dL (ref 6.0–8.5)

## 2022-12-06 MED FILL — Dexamethasone Sodium Phosphate Inj 100 MG/10ML: INTRAMUSCULAR | Qty: 2 | Status: AC

## 2022-12-07 ENCOUNTER — Ambulatory Visit: Payer: 59

## 2022-12-07 ENCOUNTER — Inpatient Hospital Stay: Payer: 59

## 2022-12-07 ENCOUNTER — Ambulatory Visit: Payer: 59 | Admitting: Internal Medicine

## 2022-12-07 ENCOUNTER — Other Ambulatory Visit: Payer: 59

## 2022-12-07 ENCOUNTER — Inpatient Hospital Stay (HOSPITAL_BASED_OUTPATIENT_CLINIC_OR_DEPARTMENT_OTHER): Payer: 59 | Admitting: Internal Medicine

## 2022-12-07 VITALS — BP 137/84 | HR 73 | Temp 99.5°F | Ht 62.0 in | Wt 176.0 lb

## 2022-12-07 DIAGNOSIS — R059 Cough, unspecified: Secondary | ICD-10-CM

## 2022-12-07 DIAGNOSIS — Z95828 Presence of other vascular implants and grafts: Secondary | ICD-10-CM

## 2022-12-07 DIAGNOSIS — C9002 Multiple myeloma in relapse: Secondary | ICD-10-CM | POA: Diagnosis not present

## 2022-12-07 DIAGNOSIS — C9000 Multiple myeloma not having achieved remission: Secondary | ICD-10-CM

## 2022-12-07 LAB — CBC WITH DIFFERENTIAL (CANCER CENTER ONLY)
Abs Immature Granulocytes: 0.05 10*3/uL (ref 0.00–0.07)
Basophils Absolute: 0 10*3/uL (ref 0.0–0.1)
Basophils Relative: 0 %
Eosinophils Absolute: 0.3 10*3/uL (ref 0.0–0.5)
Eosinophils Relative: 6 %
HCT: 30.2 % — ABNORMAL LOW (ref 36.0–46.0)
Hemoglobin: 9.7 g/dL — ABNORMAL LOW (ref 12.0–15.0)
Immature Granulocytes: 1 %
Lymphocytes Relative: 19 %
Lymphs Abs: 1.1 10*3/uL (ref 0.7–4.0)
MCH: 31.4 pg (ref 26.0–34.0)
MCHC: 32.1 g/dL (ref 30.0–36.0)
MCV: 97.7 fL (ref 80.0–100.0)
Monocytes Absolute: 0.3 10*3/uL (ref 0.1–1.0)
Monocytes Relative: 5 %
Neutro Abs: 3.7 10*3/uL (ref 1.7–7.7)
Neutrophils Relative %: 69 %
Platelet Count: 279 10*3/uL (ref 150–400)
RBC: 3.09 MIL/uL — ABNORMAL LOW (ref 3.87–5.11)
RDW: 17.3 % — ABNORMAL HIGH (ref 11.5–15.5)
WBC Count: 5.5 10*3/uL (ref 4.0–10.5)
nRBC: 0 % (ref 0.0–0.2)

## 2022-12-07 LAB — CMP (CANCER CENTER ONLY)
ALT: 10 U/L (ref 0–44)
AST: 16 U/L (ref 15–41)
Albumin: 3.9 g/dL (ref 3.5–5.0)
Alkaline Phosphatase: 84 U/L (ref 38–126)
Anion gap: 12 (ref 5–15)
BUN: 20 mg/dL (ref 8–23)
CO2: 26 mmol/L (ref 22–32)
Calcium: 9 mg/dL (ref 8.9–10.3)
Chloride: 100 mmol/L (ref 98–111)
Creatinine: 0.75 mg/dL (ref 0.44–1.00)
GFR, Estimated: >60 mL/min (ref >60)
Glucose, Bld: 103 mg/dL — ABNORMAL HIGH (ref 70–99)
Potassium: 3.5 mmol/L (ref 3.5–5.1)
Sodium: 138 mmol/L (ref 135–145)
Total Bilirubin: 0.5 mg/dL (ref 0.3–1.2)
Total Protein: 8 g/dL (ref 6.5–8.1)

## 2022-12-07 MED ORDER — HEPARIN SOD (PORK) LOCK FLUSH 100 UNIT/ML IV SOLN
500.0000 [IU] | Freq: Once | INTRAVENOUS | Status: AC
  Administered 2022-12-07: 500 [IU] via INTRAVENOUS
  Filled 2022-12-07: qty 5

## 2022-12-07 MED ORDER — VITAMIN D (ERGOCALCIFEROL) 1.25 MG (50000 UNIT) PO CAPS: 50000.0000 [IU] | ORAL_CAPSULE | ORAL | 0 refills | Status: DC

## 2022-12-07 MED ORDER — AZITHROMYCIN 250 MG PO TABS: ORAL_TABLET | ORAL | 0 refills | Status: DC

## 2022-12-07 MED ORDER — PREDNISONE 20 MG PO TABS: 20.0000 mg | ORAL_TABLET | Freq: Every day | ORAL | 0 refills | Status: DC

## 2022-12-07 NOTE — Progress Notes (Signed)
C/o cough, congestion, sinus congestion, hard to breathe at times x3 weeks.

## 2022-12-07 NOTE — Progress Notes (Signed)
[facility] OFFICE PROGRESS NOTE  Patient Care Team: Leanna Sato, MD as PCP - General (Family Medicine) End, Cristal Deer, MD as PCP - Cardiology (Cardiology) Eddie Candle, [name] Earna Coder, MD as Consulting Physician (Hematology and Oncology) Vida Rigger, MD as Consulting Physician (Pulmonary Disease) Tedd Sias Marlana Salvage, MD as Consulting Physician (Endocrinology)   Cancer Staging  No matching staging information was found for the patient.  Oncology History Overview Note  # SEP 2018- MULTIPLE MYELOMA IgALamda [2.5 gm/dl; K/L= 16/1096]; STAGE III [beta 2 microglobulin=5.5] [presented with acute renal failure; anemia; NO hypercalcemia; Skeletal survey-Normal]; BMBx- 45% plasma cells; FISH-POSITIVE 11:14 translocation.[STANDARD-high RISK]/cyto-Normal; SEP 2018- PET- L3 posterior element lesion.   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 No morphologic support for residual myeloma disease. Negative for minimal residual disease by MM-MRD flow cytometry; HOLD REVLIMID [leg swelling]; AUG 2021-PET scan negative for myeloma; continue to hold Revlimid  # MARCH 2021-diastolic congestive heart failure [Dr.End]  # BMBx- OCT 2021- BMBx- 5% plasmacytosis-however this appears to be more polyclonal rather than monoclonal-not explain patient worsening anemia.   # OCT 2022- 18th- Dara- Rev-Dex; MARCH, 2024- PROGRESSION bone marrow biopsy  70% plasma cells; PET scan no bone lesions-however conus medullaris uptake-MRI lumbar spine-   #November 2021-hyperthyroidism/goiter- [D.Bennett/Dr.Solum]-methimazole. S/p Thyroidectomy [Dr.Kim; [facility]-AUG 2022]  --------------------------  # 12/12- RIGHT JUGULAR DVT-x 69m on xarelto; finished April 2020; September 2020-EGD/dysphagia; Dr. Tobi Bastos  # Acute renal failure [Dr.Singh; Proteinuria 1.5gm/day ]; acyclovir/Asprin ------------------------------------------------------------------------------------------------------------   DIAGNOSIS: [ ]  MULTIPLE MYELOMA  STAGE: III/HIGH RISK ;GOALS: CONTROL      Multiple myeloma not having achieved remission (HCC)  11/21/2017 - 04/28/2018 Chemotherapy   Patient is on Treatment Plan : MYELOMA SALVAGE Cyclophosphamide / Carfilzomib / Dexamethasone (CCd) q28d     05/12/2021 - 02/25/2022 Chemotherapy   Patient is on Treatment Plan : MYELOMA RELAPSED REFRACTORY Daratumumab SQ + Lenalidomide + Dexamethasone (DaraRd) q28d     05/12/2021 - 07/23/2022 Chemotherapy   Patient is on Treatment Plan : MYELOMA RELAPSED REFRACTORY Daratumumab SQ + Lenalidomide + Dexamethasone (DaraRd) q28d     11/02/2022 - 11/16/2022 Chemotherapy   Patient is on Treatment Plan : MYELOMA RELAPSED/ REFRACTORY Carfilzomib D1,8,15 (20/27) + Pomalidomide + Dexamethasone (KPd) q28d     12/14/2022 -  Chemotherapy   Patient is on Treatment Plan : MYELOMA Carfilzomib + Dexamethasone + Isatuximab q28d      INTERVAL HISTORY: Ambulating independently.  Alone.  Brandi [name] 72 y.o.  female pleasant patient above history of relapsed multiple myeloma--currently on Carfil-Pom-Dex.   Patient treatment was held last week because of ongoing cough bronchitis-like symptoms.  Treated with antibiotics/steroids.   However patient continues to c/o cough, congestion, sinus congestion, hard to breathe at times x3 weeks.    Denies any worsening pain.  Patient taking tramadol as needed for leg cramps.   Patient unfortunately has not been unable to get in touch with    Review of Systems  Constitutional:  Positive for malaise/fatigue. Negative for chills, diaphoresis and fever.  HENT:  Negative for nosebleeds and sore throat.   Eyes:  Negative for double vision.  Respiratory:  Positive for cough and shortness of breath. Negative for hemoptysis.   Cardiovascular:  Negative for chest pain, palpitations and orthopnea.  Gastrointestinal:  Negative for abdominal pain, blood in stool, constipation, heartburn and melena.  Genitourinary:  Negative for dysuria, frequency and urgency.  Musculoskeletal:  Positive for back pain.  Skin: Negative.  Negative for itching and rash.  Neurological:  Negative for dizziness, tingling, focal weakness, weakness and headaches.  Endo/Heme/Allergies:  Does not bruise/bleed easily.  Psychiatric/Behavioral:  Negative for depression. The patient is not nervous/anxious.     PAST MEDICAL HISTORY :  Past Medical History:  Diagnosis Date   (HFpEF) heart failure with preserved ejection fraction (HCC)    a. 06/2020 Echo: EF 60-65%, no rwma, nl RV size/fxn. Triv MR. Triv TR/AI. Mod elev PASP.   Anxiety    COPD (chronic obstructive pulmonary disease) (HCC)    GERD (gastroesophageal reflux disease)    Hypertension    Hyperthyroidism    Hypokalemia    IgA myeloma (HCC)    Multiple myeloma (HCC)    Pneumonia    Red blood cell antibody positive, compatible PRBC difficult to obtain    S/P autologous bone marrow transplantation (HCC)     PAST SURGICAL HISTORY :   Past Surgical History:  Procedure Laterality Date   ANTERIOR VITRECTOMY Left 10/07/2021   Procedure: ANTERIOR VITRECTOMY;  Surgeon: Lockie Mola, MD;  Location: [facility];  Service: Ophthalmology;  Laterality: Left;   CATARACT EXTRACTION W/PHACO Left 10/07/2021   Procedure: CATARACT EXTRACTION PHACO AND INTRAOCULAR LENS PLACEMENT (IOC) LEFT VISION BLUE;  Surgeon: Lockie Mola, MD;   Location: [facility];  Service: Ophthalmology;  Laterality: Left;  kit for manual incision 14.56 01:21.4   CATARACT EXTRACTION W/PHACO Right 10/21/2021   Procedure: CATARACT EXTRACTION PHACO AND INTRAOCULAR LENS PLACEMENT (IOC) RIGHT 11.12 01:48.1;  Surgeon: Lockie Mola, MD;  Location: [facility];  Service: Ophthalmology;  Laterality: Right;   ESOPHAGOGASTRODUODENOSCOPY (EGD) WITH PROPOFOL N/A 03/30/2019   Procedure: ESOPHAGOGASTRODUODENOSCOPY (EGD) WITH PROPOFOL;  Surgeon: Wyline Mood, MD;  Location: [facility];  Service: Gastroenterology;  Laterality: N/A;   IR FLUORO GUIDE PORT INSERTION RIGHT  12/16/2017   IR IMAGING GUIDED PORT INSERTION  06/15/2021   KNEE ARTHROSCOPY Left 09/03/2022   Procedure: ARTHROSCOPY KNEE DEBRIDEMENT;  Surgeon: Lyndle Herrlich, MD;  Location: [facility];  Service: Orthopedics;  Laterality: Left;   THYROIDECTOMY N/A     FAMILY HISTORY :   Family History  Problem Relation Age of Onset   Pneumonia Mother    Seizures Father     SOCIAL HISTORY:   Social History   Tobacco Use   Smoking status: Former    Packs/day: .25    Types: Cigarettes    Quit date: 03/18/2021    Years since quitting: 1.7   Smokeless tobacco: Never   Tobacco comments:    2 cigarettes QOD  Vaping Use   Vaping Use: Never used  Substance Use Topics   Alcohol use: No   Drug use: No    ALLERGIES:  has No Known Allergies.  MEDICATIONS:  Current Outpatient Medications  Medication Sig Dispense Refill   albuterol (VENTOLIN HFA) 108 (90 Base) MCG/ACT inhaler Inhale 2 puffs into the lungs every 6 (six) hours as needed for wheezing or shortness of breath. 8 g 3   aspirin EC 81 MG EC tablet Take 1 tablet (81 mg  total) by mouth daily. 90 tablet 3   azithromycin (ZITHROMAX) 250 MG tablet Take 2 on day 1; and then 1 pill once a day. 6 each 0   calcium-vitamin D (OSCAL WITH D) 500MG -200UNIT ( ) tablet Take 1 tablet by mouth 2 (two) times daily. 60 tablet 0    carvedilol (COREG) 3.125 MG tablet Take 1 tablet (3.125 mg total) by mouth 2 (two) times daily with a meal. 60 tablet 5   furosemide (LASIX) 40 MG tablet Take 1 tablet (40 mg total) by mouth daily. 90 tablet 3   levothyroxine (SYNTHROID) 100 MCG tablet TAKE 1 TABLET BY MOUTH BEFORE BREAKFAST 90 tablet 0   lidocaine-prilocaine (EMLA) cream Apply 1 application topically as needed. Apply to port and cover with saran wrap 1-2 hours prior to port access 30 g 1   magnesium oxide (MAG-OX) 400 (240 Mg) MG tablet Take 1 tablet (400 mg total) by mouth 2 (two) times daily. 60 tablet 3   methylPREDNISolone (MEDROL DOSEPAK) 4 MG TBPK tablet Use as directed. 21 tablet 1   montelukast (SINGULAIR) 10 MG tablet Take 1 tablet (10 mg total) by mouth at bedtime. 30 tablet 3   ondansetron (ZOFRAN) 8 MG tablet One pill every 8 hours as needed for nausea/vomitting. 40 tablet 1   polyethylene glycol (MIRALAX / GLYCOLAX) packet Take 17 g by mouth daily as needed for mild constipation. 14 each 0   pomalidomide (POMALYST) 4 MG capsule Take 1 capsule (4 mg total) by mouth daily. Take for 21 days, then old for 7 days. Repeat every 28 days. 21 capsule 0   potassium chloride SA (KLOR-CON M) 20 MEQ tablet Take 1 tablet (20 mEq total) by mouth 2 (two) times daily. 60 tablet 6   predniSONE (DELTASONE) 20 MG tablet Take 1 tablet (20 mg total) by mouth daily with breakfast. Take 3 pills once a day for 4 days; then 2 pills once a day for 4 days then 1 pill a day. Once a day with food. 30 tablet 0   spironolactone (ALDACTONE) 25 MG tablet Take 1 tablet by mouth once daily 90 tablet 3   traMADol (ULTRAM) 50 MG tablet Take 1 tablet (50 mg total) by mouth every 12 (twelve) hours as needed. 60 tablet 0   acyclovir (ZOVIRAX) 400 MG tablet Take 1 tablet by mouth twice daily 60 tablet 0   benzonatate (TESSALON) 100 MG capsule TAKE 1 CAPSULE BY MOUTH TWICE DAILY AS NEEDED FOR COUGH 40 capsule 0   pantoprazole (PROTONIX) 40 MG tablet Take 1  tablet (40 mg total) by mouth daily. 90 tablet 0   Vitamin D, Ergocalciferol, (DRISDOL) 1.25 MG (50000 UNIT) CAPS capsule Take 1 capsule (50,000 Units total) by mouth once a week. 12 capsule 0   No current facility-administered medications for this visit.   Facility-Administered Medications Ordered in Other Visits  Medication Dose Route Frequency Provider Last Rate Last Admin   0.9 %  sodium chloride infusion   Intravenous Continuous Ralene Muskrat, PA-C        PHYSICAL EXAMINATION:   BP 137/84 (BP Location: Left Arm, Patient Position: Sitting, Cuff Size: Normal)   Pulse 73   Temp 99.5 F (37.5 C) (Tympanic)   Ht 5\' 2"  (1.575 m)   Wt 176 lb (79.8 kg)   SpO2 97%   BMI 32.19 kg/m   Filed Weights   12/07/22 0934  Weight: 176 lb (79.8 kg)   Bilateral coarse breath sounds.   Physical Exam HENT:  Head: Normocephalic and atraumatic.  Eyes:     Pupils: Pupils are equal, round, and reactive to light.  Cardiovascular:     Rate and Rhythm: Normal rate and regular rhythm.  Pulmonary:     Effort: Pulmonary effort is normal. No respiratory distress.     Breath sounds: Normal breath sounds. No wheezing.  Abdominal:     General: Bowel sounds are normal. There is no distension.     Palpations: Abdomen is soft. There is no mass.     Tenderness: There is no abdominal tenderness. There is no guarding or rebound.  Musculoskeletal:        General: No tenderness. Normal range of motion.     Cervical back: Normal range of motion and neck supple.  Skin:    General: Skin is warm.  Neurological:     Mental Status: She is alert and oriented to person, place, and time.  Psychiatric:        Mood and Affect: Affect normal.    LABORATORY DATA:  I have reviewed the data as listed    Component Value Date/Time   NA 138 12/07/2022 0937   NA 141 01/25/2020 1530   K 3.5 12/07/2022 0937   CL 100 12/07/2022 0937   CO2 26 12/07/2022 0937   GLUCOSE 103 (H) 12/07/2022 0937   BUN 20 12/07/2022  0937   BUN 19 01/25/2020 1530   CREATININE 0.75 12/07/2022 0937   CALCIUM 9.0 12/07/2022 0937   PROT 8.0 12/07/2022 0937   ALBUMIN 3.9 12/07/2022 0937   AST 16 12/07/2022 0937   ALT 10 12/07/2022 0937   ALKPHOS 84 12/07/2022 0937   BILITOT 0.5 12/07/2022 0937   GFRNONAA >60 12/07/2022 0937   GFRAA NOT CALCULATED 04/17/2020 1352    No results found for: "SPEP", "UPEP"  Lab Results  Component Value Date   WBC 5.5 12/07/2022   NEUTROABS 3.7 12/07/2022   HGB 9.7 (L) 12/07/2022   HCT 30.2 (L) 12/07/2022   MCV 97.7 12/07/2022   PLT 279 12/07/2022      Chemistry      Component Value Date/Time   NA 138 12/07/2022 0937   NA 141 01/25/2020 1530   K 3.5 12/07/2022 0937   CL 100 12/07/2022 0937   CO2 26 12/07/2022 0937   BUN 20 12/07/2022 0937   BUN 19 01/25/2020 1530   CREATININE 0.75 12/07/2022 0937      Component Value Date/Time   CALCIUM 9.0 12/07/2022 0937   ALKPHOS 84 12/07/2022 0937   AST 16 12/07/2022 0937   ALT 10 12/07/2022 0937   BILITOT 0.5 12/07/2022 0937      (H): Data is abnormally high   Latest Reference Range & Units Most Recent 12/24/20 14:01 04/01/21 14:34 04/28/21 09:17 06/11/21 09:42 07/03/21 10:32 08/20/21 10:06  M Protein SerPl Elph-Mcnc Not Observed g/dL 1.1 (H) (C) 1/61/09 60:45 Not Observed (C) 0.6 (H) (C) 0.7 (H) (C) 0.5 (H) (C) 0.7 (H) (C) 1.1 (H) (C)    Latest Reference Range & Units Most Recent 09/17/21 08:47 10/15/21 08:25 11/12/21 08:55  Kappa free light chain 3.3 - 19.4 mg/L 5.0 11/12/21 08:55 7.2 7.2 5.0  Lambda free light chains 5.7 - 26.3 mg/L 223.6 (H) 11/12/21 08:55 215.3 (H) 340.2 (H) 223.6 (H)  Kappa, lambda light chain ratio 0.26 - 1.65  0.02 (L) 11/12/21 08:55 0.03 (L) 0.02 (L) 0.02 (L)  (H): Data is abnormally high (L): Data is abnormally low  RADIOGRAPHIC STUDIES: I have personally reviewed the  radiological images as listed and agreed with the findings in the report. No results found.   ASSESSMENT & PLAN:  Multiple  myeloma not having achieved remission (HCC) # RECURRENT Multiple myeloma stage III [high-risk cytogenetics-status post KRD-VGPR ; status post autologous stem cell transplant on 06/15/18.  Most recently progressed on salvage therapy with daratumumab  lenalidomide [15 mg 3 w; 1-w] dexamethasone. plasma cell myeloma by approximately 70% of the overall cellular marrow;  FEB 2024-lambda light chain 284 Bone marrow-70% plasma cells. M Protein 0.9-rising lambda light chain ratio. Discontinue Revlimid; and dara-given the progressive disease.  MARCH 2024- PET scan: No skeletal lesions noted. APRIl 2024- MRI-cervical/thoracic/lumbar spine-negative for any lesions in the spinal cord. ONchemotherapy with carfilzomib-dexamethasone [days 08/02/13 every 28 days] and Pomalyst [4 mg-Take 21 days on, 7 days off, repeat every 28 days. ].    # HOLD with cycle cycle number 2-day 1 chemotherapy today. HOLD pomalyst-until next visit. SEE BELOW [acute bronchitis] Labs today reviewed; reviewed the note from Dr.Gasperratto.  Consider CAR-T therapy/collection-  MARCH 2024- Left ventricular ejection fraction, by estimation, is 50 to 55%  # Discontinue current therapy-given lack of significant response-recommend starting isatuximab-dexamethasone carfilzomib.  Will plan to start after IVIG infusions/given the frequent sinus infection/bronchitis.  # Cough/ mild dyspnea- Seasonal allergies vs others-acute bronchitis- continue Singulair/zyrtec; continue abuterol inhlaer  q 2-3 hours. Also continue nebs twice a day. MAY 2024- CXR- NEG; ; BNP-WNL; tessolon pearls prn. Start azithromycin x5 days; and prednisone.  IgG levels less than 400.  Also recommend IVIG infusions 400 mg/kg dose once a month to prevent immunosuppression.  # Hypokalemia: mild- recommend Kdur TID; hypocalcemia- 8.0- vit D march 2024- 50- recommend ca+vit D BID.   # Left knee septic arthritis/immunocompromised host- [s/p Dr.Bowers; ID]; currently-S/p evaluation with ID-s/p  antibioics- continue tramadol. Stable.   # Iatrogenic hypothyroidism [ Graves/goiter s/p total thyroidectomy;aug,2022 ]-currently on Synthroid 100 mcg/day-September 2023 TSH normal.  Stable.  # Lower back discomfort x 6 months-see above.  MRI lumbar spine as above.  Continue tramadol. Stable.  # Bone lesions /last zometa on 11/27/2017.    Dental extraction appointment appt-on Jan 26th.  PET scan evidence of any bone lesions.-Hold Zometa for now. Stable.  # Insomnia- recommend restart melatonin 1 to 2 mg nightly as needed. Stable.  # IV access: mediport- functional  # Vaccination: Flu shot s/p;  s/p Pneumonia vaccination [march 2024].   6 w- given dental extrac- on 1/26. :MM panel; K/l light chain ratio q 4 W  # DISPOSITION:  # ordered CXR # HOLD chemo today; cancel next week infusion # in 1 week- MD; labs- cbc/bmp;IVIG infusion- Dr.B      Orders Placed This Encounter  Procedures   DG Chest 2 View    Standing Status:   Future    Standing Expiration Date:   12/07/2023    Order Specific Question:   Reason for Exam (SYMPTOM  OR DIAGNOSIS REQUIRED)    Answer:   cough sob    Order Specific Question:   Preferred imaging location?    Answer:   [facility]   Basic metabolic panel    Standing Status:   Future    Standing Expiration Date:   12/07/2023   Pretreatment RBC phenotype    Obtain prior to isatuximab-irfc treatment.    Standing Status:   Future    Standing Expiration Date:   12/13/2023    Order Specific Question:   Medication to be given:    Answer:  Isatuximab-irfc     All questions were answered. The patient knows to call the clinic with any problems, questions or concerns.      Earna Coder, MD 12/13/2022 2:57 PM

## 2022-12-07 NOTE — Assessment & Plan Note (Addendum)
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  Most recently progressed on salvage therapy with daratumumab  lenalidomide [15 mg 3 w; 1-w] dexamethasone. plasma cell myeloma by approximately 70% of the overall cellular marrow;  FEB 2024-lambda light chain 284 Bone marrow-70% plasma cells. M Protein 0.9-rising lambda light chain ratio. Discontinue Revlimid; and dara-given the progressive disease.  MARCH 2024- PET scan: No skeletal lesions noted. APRIl 2024- MRI-cervical/thoracic/lumbar spine-negative for any lesions in the spinal cord. ONchemotherapy with carfilzomib-dexamethasone [days 08/02/13 every 28 days] and Pomalyst [4 mg-Take 21 days on, 7 days off, repeat every 28 days. ].    # HOLD with cycle cycle number 2-day 1 chemotherapy today. HOLD pomalyst-until next visit. SEE BELOW [acute bronchitis] Labs today reviewed; reviewed the note from Dr.Gasperratto.  Consider CAR-T therapy/collection-  MARCH 2024- Left ventricular ejection fraction, by estimation, is 50 to 55%  # Discontinue current therapy-given lack of significant response-recommend starting isatuximab-dexamethasone carfilzomib.  Will plan to start after IVIG infusions/given the frequent sinus infection/bronchitis.  # Cough/ mild dyspnea- Seasonal allergies vs others-acute bronchitis- continue Singulair/zyrtec; continue abuterol inhlaer  q 2-3 hours. Also continue nebs twice a day. MAY 2024- CXR- NEG; ; BNP-WNL; tessolon pearls prn. Start azithromycin x5 days; and prednisone.  IgG levels less than 400.  Also recommend IVIG infusions 400 mg/kg dose once a month to prevent immunosuppression.  # Hypokalemia: mild- recommend Kdur TID; hypocalcemia- 8.0- vit D march 2024- 50- recommend ca+vit D BID.   # Left knee septic arthritis/immunocompromised host- [s/p Dr.Bowers; ID]; currently-S/p evaluation with ID-s/p antibioics- continue tramadol. Stable.   #  Iatrogenic hypothyroidism [ Graves/goiter s/p total thyroidectomy;aug,2022 ]-currently on Synthroid 100 mcg/day-September 2023 TSH normal.  Stable.  # Lower back discomfort x 6 months-see above.  MRI lumbar spine as above.  Continue tramadol. Stable.  # Bone lesions /last zometa on 11/27/2017.    Dental extraction appointment appt-on Jan 26th.  PET scan evidence of any bone lesions.-Hold Zometa for now. Stable.  # Insomnia- recommend restart melatonin 1 to 2 mg nightly as needed. Stable.  # IV access: mediport- functional  # Vaccination: Flu shot s/p;  s/p Pneumonia vaccination [march 2024].   6 w- given dental extrac- on 1/26. :MM panel; K/l light chain ratio q 4 W  # DISPOSITION:  # ordered CXR # HOLD chemo today; cancel next week infusion # in 1 week- MD; labs- cbc/bmp;IVIG infusion- Dr.B

## 2022-12-08 ENCOUNTER — Other Ambulatory Visit: Payer: Self-pay

## 2022-12-11 ENCOUNTER — Other Ambulatory Visit: Payer: Self-pay | Admitting: Internal Medicine

## 2022-12-11 DIAGNOSIS — C9002 Multiple myeloma in relapse: Secondary | ICD-10-CM

## 2022-12-13 ENCOUNTER — Encounter: Payer: Self-pay | Admitting: Internal Medicine

## 2022-12-13 NOTE — Progress Notes (Signed)
DISCONTINUE ON PATHWAY REGIMEN - Multiple Myeloma and Other Plasma Cell Dyscrasias     A cycle is every 28 days:     Carfilzomib      Carfilzomib      Carfilzomib      Pomalidomide      Dexamethasone   **Always confirm dose/schedule in your pharmacy ordering system**  REASON: Disease Progression PRIOR TREATMENT: [id number]: KPd (Once Weekly Carfilzomib 20/27 mg/m2 + Pomalidomide + Dexamethasone) q28 Days Until Progression or Unacceptable Toxicity TREATMENT RESPONSE: Progressive Disease (PD)  START ON PATHWAY REGIMEN - Multiple Myeloma and Other Plasma Cell Dyscrasias     Cycle 1: A cycle is 28 days:     Dexamethasone      Isatuximab-irfc      Carfilzomib      Carfilzomib    Cycles 2 and beyond: A cycle is every 28 days:     Dexamethasone      Isatuximab-irfc      Carfilzomib   **Always confirm dose/schedule in your pharmacy ordering system**  Patient Characteristics: Multiple Myeloma, Relapsed / Refractory, Second through Fourth Lines of Therapy, Fit or Candidate for Triplet Therapy, Lenalidomide-Refractory or Lenalidomide-based Regimen Not Preferred, Candidate for Anti-CD38 Antibody Disease Classification: Multiple Myeloma Therapeutic Status: Relapsed R2-ISS Staging: III Line of Therapy: Third Line Anti-CD38 Antibody Candidacy: Candidate for Anti-CD38 Antibody Lenalidomide-based Regimen Preference/Candidacy: Lenalidomide-Refractory Intent of Therapy: Non-Curative / Palliative Intent, Discussed with Patient

## 2022-12-14 ENCOUNTER — Other Ambulatory Visit: Payer: 59

## 2022-12-14 ENCOUNTER — Ambulatory Visit: Payer: 59

## 2022-12-14 ENCOUNTER — Ambulatory Visit: Payer: 59 | Admitting: Internal Medicine

## 2022-12-14 ENCOUNTER — Other Ambulatory Visit: Payer: Self-pay

## 2022-12-14 DIAGNOSIS — C9000 Multiple myeloma not having achieved remission: Secondary | ICD-10-CM

## 2022-12-15 ENCOUNTER — Inpatient Hospital Stay (HOSPITAL_BASED_OUTPATIENT_CLINIC_OR_DEPARTMENT_OTHER): Payer: 59 | Admitting: Internal Medicine

## 2022-12-15 ENCOUNTER — Encounter: Payer: Self-pay | Admitting: Internal Medicine

## 2022-12-15 ENCOUNTER — Inpatient Hospital Stay: Payer: 59

## 2022-12-15 VITALS — BP 127/76 | HR 76 | Temp 98.2°F | Ht 62.0 in | Wt 173.0 lb

## 2022-12-15 DIAGNOSIS — Z95828 Presence of other vascular implants and grafts: Secondary | ICD-10-CM

## 2022-12-15 DIAGNOSIS — C9000 Multiple myeloma not having achieved remission: Secondary | ICD-10-CM

## 2022-12-15 DIAGNOSIS — C9002 Multiple myeloma in relapse: Secondary | ICD-10-CM | POA: Diagnosis not present

## 2022-12-15 LAB — CBC WITH DIFFERENTIAL (CANCER CENTER ONLY)
Abs Immature Granulocytes: 0.13 10*3/uL — ABNORMAL HIGH (ref 0.00–0.07)
Basophils Absolute: 0 10*3/uL (ref 0.0–0.1)
Basophils Relative: 1 %
Eosinophils Absolute: 0.5 10*3/uL (ref 0.0–0.5)
Eosinophils Relative: 7 %
HCT: 27.6 % — ABNORMAL LOW (ref 36.0–46.0)
Hemoglobin: 8.7 g/dL — ABNORMAL LOW (ref 12.0–15.0)
Immature Granulocytes: 2 %
Lymphocytes Relative: 32 %
Lymphs Abs: 2 10*3/uL (ref 0.7–4.0)
MCH: 30.6 pg (ref 26.0–34.0)
MCHC: 31.5 g/dL (ref 30.0–36.0)
MCV: 97.2 fL (ref 80.0–100.0)
Monocytes Absolute: 0.3 10*3/uL (ref 0.1–1.0)
Monocytes Relative: 4 %
Neutro Abs: 3.4 10*3/uL (ref 1.7–7.7)
Neutrophils Relative %: 54 %
Platelet Count: 296 10*3/uL (ref 150–400)
RBC: 2.84 MIL/uL — ABNORMAL LOW (ref 3.87–5.11)
RDW: 16.6 % — ABNORMAL HIGH (ref 11.5–15.5)
WBC Count: 6.3 10*3/uL (ref 4.0–10.5)
nRBC: 0 % (ref 0.0–0.2)

## 2022-12-15 LAB — BASIC METABOLIC PANEL
Anion gap: 10 (ref 5–15)
BUN: 17 mg/dL (ref 8–23)
CO2: 23 mmol/L (ref 22–32)
Calcium: 8.3 mg/dL — ABNORMAL LOW (ref 8.9–10.3)
Chloride: 106 mmol/L (ref 98–111)
Creatinine, Ser: 0.81 mg/dL (ref 0.44–1.00)
GFR, Estimated: >60 mL/min (ref >60)
Glucose, Bld: 116 mg/dL — ABNORMAL HIGH (ref 70–99)
Potassium: 3.6 mmol/L (ref 3.5–5.1)
Sodium: 139 mmol/L (ref 135–145)

## 2022-12-15 LAB — SAMPLE TO BLOOD BANK

## 2022-12-15 MED ORDER — SODIUM CHLORIDE 0.9% FLUSH
10.0000 mL | Freq: Once | INTRAVENOUS | Status: AC
  Administered 2022-12-15: 10 mL via INTRAVENOUS
  Filled 2022-12-15: qty 10

## 2022-12-15 MED ORDER — HEPARIN SOD (PORK) LOCK FLUSH 100 UNIT/ML IV SOLN
500.0000 [IU] | Freq: Once | INTRAVENOUS | Status: AC
  Administered 2022-12-15: 500 [IU] via INTRAVENOUS
  Filled 2022-12-15: qty 5

## 2022-12-15 NOTE — Assessment & Plan Note (Addendum)
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  Most recently progressed on salvage therapy with daratumumab  lenalidomide [15 mg 3 w; 1-w] dexamethasone. plasma cell myeloma by approximately 70% of the overall cellular marrow;  FEB 2024-lambda light chain 284 Bone marrow-70% plasma cells. M Protein 0.9-rising lambda light chain ratio. Discontinue Revlimid; and dara-given the progressive disease.  MARCH 2024- PET scan: No skeletal lesions noted. APRIl 2024- MRI-cervical/thoracic/lumbar spine-negative for any lesions in the spinal cord. ONchemotherapy with carfilzomib-dexamethasone [days 08/02/13 every 28 days] and Pomalyst [4 mg-Take 21 days on, 7 days off, repeat every 28 days. ].   # Given frequent infection [sino-pul]- HOLD chemo now. Plan to start Isa-Carfil-Dex in 2 weeks or so.  MARCH 2024- Left ventricular ejection fraction, by estimation, is 50 to 55%  # Frequent sinus/acute bronchitis infection-status post antibiotics improved.  IgG levels less than 400. Proceed with IVIG infusions 400 mg/kg dose once a month to prevent immunosuppression.  # Hypokalemia: mild- recommend Kdur TID; hypocalcemia- 8.0- vit D march 2024- 50- recommend ca+vit D BID.   # Left knee septic arthritis/immunocompromised host- [s/p Dr.Bowers; ID]; currently-S/p evaluation with ID-s/p antibioics- continue tramadol. Stable.  # Iatrogenic hypothyroidism [ Graves/goiter s/p total thyroidectomy;aug,2022 ]-currently on Synthroid 100 mcg/day-September 2023 TSH normal.  Stable.  # Lower back discomfort x 6 months-see above.  MRI lumbar spine as above.  Continue tramadol. Stable.  # Bone lesions /last zometa on 11/27/2017.    Dental extraction appointment appt-on Jan 26th.  PET scan evidence of any bone lesions.-Hold Zometa for now. Stable.  # IV access: mediport- functional  # Vaccination: Flu shot s/p;  s/p Pneumonia vaccination  [march 2024].   6 w- given dental extrac- on 1/26. :MM panel; K/l light chain ratio q 4 W  # DISPOSITION:  # IVIG as planned tomorrow.  # as per IS-  follow up on June 3rd MD; labs-cbc/cmp; MM panel; K-L light chains; [new] Isatux-Carfil-Dex; D-2 carfil #  as per IS-  D-8;MD- labs- cbc/bmp;  and d-9 - Dr.B

## 2022-12-15 NOTE — Progress Notes (Signed)
[facility] OFFICE PROGRESS NOTE  Patient Care Team: Leanna Sato, MD as PCP - General (Family Medicine) End, Cristal Deer, MD as PCP - Cardiology (Cardiology) Eddie Candle, [name] Earna Coder, MD as Consulting Physician (Hematology and Oncology) Vida Rigger, MD as Consulting Physician (Pulmonary Disease) Tedd Sias Marlana Salvage, MD as Consulting Physician (Endocrinology)   Cancer Staging  No matching staging information was found for the patient.  Oncology History Overview Note  # SEP 2018- MULTIPLE MYELOMA IgALamda [2.5 gm/dl; K/L= 16/1096]; STAGE III [beta 2 microglobulin=5.5] [presented with acute renal failure; anemia; NO hypercalcemia; Skeletal survey-Normal]; BMBx- 45% plasma cells; FISH-POSITIVE 11:14 translocation.[STANDARD-high RISK]/cyto-Normal; SEP 2018- PET- L3 posterior element lesion.   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 No morphologic support for residual myeloma disease. Negative for minimal residual disease by MM-MRD flow cytometry; HOLD REVLIMID [leg swelling]; AUG 2021-PET scan negative for myeloma; continue to hold Revlimid  # MARCH 2021-diastolic congestive heart failure [Dr.End]  # BMBx- OCT 2021- BMBx- 5% plasmacytosis-however this appears to be more polyclonal rather than monoclonal-not explain patient worsening anemia.   # OCT 2022- 18th- Dara- Rev-Dex; MARCH, 2024- PROGRESSION bone marrow biopsy  70% plasma cells; PET scan no bone lesions-however conus medullaris uptake-MRI lumbar spine-   #November 2021-hyperthyroidism/goiter- [D.Bennett/Dr.Solum]-methimazole. S/p Thyroidectomy [Dr.Kim; [facility]-AUG 2022]  --------------------------  # 12/12- RIGHT JUGULAR DVT-x 67m on xarelto; finished April 2020; September 2020-EGD/dysphagia; Dr. Tobi Bastos  # Acute renal failure [Dr.Singh; Proteinuria 1.5gm/day ]; acyclovir/Asprin ------------------------------------------------------------------------------------------------------------   DIAGNOSIS: [ ]  MULTIPLE MYELOMA  STAGE: III/HIGH RISK ;GOALS: CONTROL      Multiple myeloma not having achieved remission (HCC)  11/21/2017 - 04/28/2018 Chemotherapy   Patient is on Treatment Plan : MYELOMA SALVAGE Cyclophosphamide / Carfilzomib / Dexamethasone (CCd) q28d     05/12/2021 - 02/25/2022 Chemotherapy   Patient is on Treatment Plan : MYELOMA RELAPSED REFRACTORY Daratumumab SQ + Lenalidomide + Dexamethasone (DaraRd) q28d     05/12/2021 - 07/23/2022 Chemotherapy   Patient is on Treatment Plan : MYELOMA RELAPSED REFRACTORY Daratumumab SQ + Lenalidomide + Dexamethasone (DaraRd) q28d     11/02/2022 - 11/16/2022 Chemotherapy   Patient is on Treatment Plan : MYELOMA RELAPSED/ REFRACTORY Carfilzomib D1,8,15 (20/27) + Pomalidomide + Dexamethasone (KPd) q28d     12/27/2022 -  Chemotherapy   Patient is on Treatment Plan : MYELOMA Carfilzomib + Dexamethasone + Isatuximab q28d      INTERVAL HISTORY: Ambulating in a wheel chair.   Alone.  Brandi [name] 72 y.o.  female pleasant patient above history of relapsed multiple myeloma--currently on Carfil-Pom-Dex.   Patient treatment was held last 2 week because of ongoing cough bronchitis-like symptoms.  Treated with antibiotics/steroids.   Symptoms currently improved. denies any worsening pain.  Patient taking tramadol as needed for leg cramps.    Review of Systems  Constitutional:  Positive for malaise/fatigue.  Negative for chills, diaphoresis and fever.  HENT:  Negative for nosebleeds and sore throat.   Eyes:  Negative for double vision.  Respiratory:  Positive for cough  and shortness of breath. Negative for hemoptysis.   Cardiovascular:  Negative for chest pain, palpitations and orthopnea.  Gastrointestinal:  Negative for abdominal pain, blood in stool, constipation, heartburn and melena.  Genitourinary:  Negative for dysuria, frequency and urgency.  Musculoskeletal:  Positive for back pain.  Skin: Negative.  Negative for itching and rash.  Neurological:  Negative for dizziness, tingling, focal weakness, weakness and headaches.  Endo/Heme/Allergies:  Does not bruise/bleed easily.  Psychiatric/Behavioral:  Negative for depression. The patient is not nervous/anxious.     PAST MEDICAL HISTORY :  Past Medical History:  Diagnosis Date   (HFpEF) heart failure with preserved ejection fraction (HCC)    a. 06/2020 Echo: EF 60-65%, no rwma, nl RV size/fxn. Triv MR. Triv TR/AI. Mod elev PASP.   Anxiety    COPD (chronic obstructive pulmonary disease) (HCC)    GERD (gastroesophageal reflux disease)    Hypertension    Hyperthyroidism    Hypokalemia    IgA myeloma (HCC)    Multiple myeloma (HCC)    Pneumonia    Red blood cell antibody positive, compatible PRBC difficult to obtain    S/P autologous bone marrow transplantation (HCC)     PAST SURGICAL HISTORY :   Past Surgical History:  Procedure Laterality Date   ANTERIOR VITRECTOMY Left 10/07/2021   Procedure: ANTERIOR VITRECTOMY;  Surgeon: Lockie Mola, MD;  Location: [facility];  Service: Ophthalmology;  Laterality: Left;   CATARACT EXTRACTION W/PHACO Left 10/07/2021   Procedure: CATARACT EXTRACTION PHACO AND INTRAOCULAR LENS PLACEMENT (IOC) LEFT VISION BLUE;  Surgeon: Lockie Mola, MD;  Location: [facility];  Service: Ophthalmology;  Laterality: Left;  kit for manual incision 14.56 01:21.4   CATARACT EXTRACTION  W/PHACO Right 10/21/2021   Procedure: CATARACT EXTRACTION PHACO AND INTRAOCULAR LENS PLACEMENT (IOC) RIGHT 11.12 01:48.1;  Surgeon: Lockie Mola, MD;  Location: [facility];  Service: Ophthalmology;  Laterality: Right;   ESOPHAGOGASTRODUODENOSCOPY (EGD) WITH PROPOFOL N/A 03/30/2019   Procedure: ESOPHAGOGASTRODUODENOSCOPY (EGD) WITH PROPOFOL;  Surgeon: Wyline Mood, MD;  Location: [facility];  Service: Gastroenterology;  Laterality: N/A;   IR FLUORO GUIDE PORT INSERTION RIGHT  12/16/2017   IR IMAGING GUIDED PORT INSERTION  06/15/2021   KNEE ARTHROSCOPY Left 09/03/2022   Procedure: ARTHROSCOPY KNEE DEBRIDEMENT;  Surgeon: Lyndle Herrlich, MD;  Location: [facility];  Service: Orthopedics;  Laterality: Left;   THYROIDECTOMY N/A     FAMILY HISTORY :   Family History  Problem Relation Age of Onset   Pneumonia Mother    Seizures Father     SOCIAL HISTORY:   Social History   Tobacco Use   Smoking status: Former    Packs/day: .25    Types: Cigarettes    Quit date: 03/18/2021    Years since quitting: 1.7   Smokeless tobacco: Never   Tobacco comments:    2 cigarettes QOD  Vaping Use   Vaping Use: Never used  Substance Use Topics   Alcohol use: No   Drug use: No    ALLERGIES:  has No Known Allergies.  MEDICATIONS:  Current Outpatient Medications  Medication Sig Dispense Refill   acyclovir (ZOVIRAX) 400 MG tablet Take 1 tablet by mouth twice daily 60 tablet 0   albuterol (VENTOLIN HFA) 108 (90 Base) MCG/ACT inhaler Inhale 2 puffs into the lungs every 6 (six) hours as needed for wheezing or shortness of breath. 8 g 3   aspirin EC 81 MG EC tablet Take 1 tablet (81 mg total) by mouth daily. 90  tablet 3   benzonatate (TESSALON) 100 MG capsule TAKE 1 CAPSULE BY MOUTH TWICE DAILY AS NEEDED FOR COUGH 40 capsule 0   calcium-vitamin D (OSCAL WITH D) 500MG -200UNIT ( ) tablet Take 1 tablet by mouth 2 (two) times daily. 60 tablet 0   carvedilol (COREG) 3.125 MG tablet Take 1  tablet (3.125 mg total) by mouth 2 (two) times daily with a meal. 60 tablet 5   furosemide (LASIX) 40 MG tablet Take 1 tablet (40 mg total) by mouth daily. 90 tablet 3   levothyroxine (SYNTHROID) 100 MCG tablet TAKE 1 TABLET BY MOUTH BEFORE BREAKFAST 90 tablet 0   lidocaine-prilocaine (EMLA) cream Apply 1 application topically as needed. Apply to port and cover with saran wrap 1-2 hours prior to port access 30 g 1   magnesium oxide (MAG-OX) 400 (240 Mg) MG tablet Take 1 tablet (400 mg total) by mouth 2 (two) times daily. 60 tablet 3   methylPREDNISolone (MEDROL DOSEPAK) 4 MG TBPK tablet Use as directed. 21 tablet 1   montelukast (SINGULAIR) 10 MG tablet Take 1 tablet (10 mg total) by mouth at bedtime. 30 tablet 3   ondansetron (ZOFRAN) 8 MG tablet One pill every 8 hours as needed for nausea/vomitting. 40 tablet 1   polyethylene glycol (MIRALAX / GLYCOLAX) packet Take 17 g by mouth daily as needed for mild constipation. 14 each 0   pomalidomide (POMALYST) 4 MG capsule Take 1 capsule (4 mg total) by mouth daily. Take for 21 days, then old for 7 days. Repeat every 28 days. 21 capsule 0   potassium chloride SA (KLOR-CON M) 20 MEQ tablet Take 1 tablet (20 mEq total) by mouth 2 (two) times daily. 60 tablet 6   spironolactone (ALDACTONE) 25 MG tablet Take 1 tablet by mouth once daily 90 tablet 3   traMADol (ULTRAM) 50 MG tablet Take 1 tablet (50 mg total) by mouth every 12 (twelve) hours as needed. 60 tablet 0   Vitamin D, Ergocalciferol, (DRISDOL) 1.25 MG (50000 UNIT) CAPS capsule Take 1 capsule (50,000 Units total) by mouth once a week. 12 capsule 0   pantoprazole (PROTONIX) 40 MG tablet Take 1 tablet (40 mg total) by mouth daily. 90 tablet 0   No current facility-administered medications for this visit.   Facility-Administered Medications Ordered in Other Visits  Medication Dose Route Frequency Provider Last Rate Last Admin   0.9 %  sodium chloride infusion   Intravenous Continuous Ralene Muskrat,  PA-C        PHYSICAL EXAMINATION:   BP 127/76 (BP Location: Left Arm, Patient Position: Sitting, Cuff Size: Normal)   Pulse 76   Temp 98.2 F (36.8 C) (Tympanic)   Ht 5\' 2"  (1.575 m)   Wt 173 lb (78.5 kg)   SpO2 98%   BMI 31.64 kg/m   Filed Weights   12/15/22 0826  Weight: 173 lb (78.5 kg)   Bilateral coarse breath sounds.   Physical Exam HENT:     Head: Normocephalic and atraumatic.  Eyes:     Pupils: Pupils are equal, round, and reactive to light.  Cardiovascular:     Rate and Rhythm: Normal rate and regular rhythm.  Pulmonary:     Effort: Pulmonary effort is normal. No respiratory distress.     Breath sounds: Normal breath sounds. No wheezing.  Abdominal:     General: Bowel sounds are normal. There is no distension.     Palpations: Abdomen is soft. There is no mass.     Tenderness: There  is no abdominal tenderness. There is no guarding or rebound.  Musculoskeletal:        General: No tenderness. Normal range of motion.     Cervical back: Normal range of motion and neck supple.  Skin:    General: Skin is warm.  Neurological:     Mental Status: She is alert and oriented to person, place, and time.  Psychiatric:        Mood and Affect: Affect normal.    LABORATORY DATA:  I have reviewed the data as listed    Component Value Date/Time   NA 139 12/15/2022 0828   NA 141 01/25/2020 1530   K 3.6 12/15/2022 0828   CL 106 12/15/2022 0828   CO2 23 12/15/2022 0828   GLUCOSE 116 (H) 12/15/2022 0828   BUN 17 12/15/2022 0828   BUN 19 01/25/2020 1530   CREATININE 0.81 12/15/2022 0828   CREATININE 0.75 12/07/2022 0937   CALCIUM 8.3 (L) 12/15/2022 0828   PROT 8.0 12/07/2022 0937   ALBUMIN 3.9 12/07/2022 0937   AST 16 12/07/2022 0937   ALT 10 12/07/2022 0937   ALKPHOS 84 12/07/2022 0937   BILITOT 0.5 12/07/2022 0937   GFRNONAA >60 12/15/2022 0828   GFRNONAA >60 12/07/2022 0937   GFRAA NOT CALCULATED 04/17/2020 1352    No results found for: "SPEP",  "UPEP"  Lab Results  Component Value Date   WBC 6.3 12/15/2022   NEUTROABS 3.4 12/15/2022   HGB 8.7 (L) 12/15/2022   HCT 27.6 (L) 12/15/2022   MCV 97.2 12/15/2022   PLT 296 12/15/2022      Chemistry      Component Value Date/Time   NA 139 12/15/2022 0828   NA 141 01/25/2020 1530   K 3.6 12/15/2022 0828   CL 106 12/15/2022 0828   CO2 23 12/15/2022 0828   BUN 17 12/15/2022 0828   BUN 19 01/25/2020 1530   CREATININE 0.81 12/15/2022 0828   CREATININE 0.75 12/07/2022 0937      Component Value Date/Time   CALCIUM 8.3 (L) 12/15/2022 0828   ALKPHOS 84 12/07/2022 0937   AST 16 12/07/2022 0937   ALT 10 12/07/2022 0937   BILITOT 0.5 12/07/2022 0937      (H): Data is abnormally high   Latest Reference Range & Units Most Recent 12/24/20 14:01 04/01/21 14:34 04/28/21 09:17 06/11/21 09:42 07/03/21 10:32 08/20/21 10:06  M Protein SerPl Elph-Mcnc Not Observed g/dL 1.1 (H) (C) 10/17/38 10:27 Not Observed (C) 0.6 (H) (C) 0.7 (H) (C) 0.5 (H) (C) 0.7 (H) (C) 1.1 (H) (C)    Latest Reference Range & Units Most Recent 09/17/21 08:47 10/15/21 08:25 11/12/21 08:55  Kappa free light chain 3.3 - 19.4 mg/L 5.0 11/12/21 08:55 7.2 7.2 5.0  Lambda free light chains 5.7 - 26.3 mg/L 223.6 (H) 11/12/21 08:55 215.3 (H) 340.2 (H) 223.6 (H)  Kappa, lambda light chain ratio 0.26 - 1.65  0.02 (L) 11/12/21 08:55 0.03 (L) 0.02 (L) 0.02 (L)  (H): Data is abnormally high (L): Data is abnormally low  RADIOGRAPHIC STUDIES: I have personally reviewed the radiological images as listed and agreed with the findings in the report. No results found.   ASSESSMENT & PLAN:  Multiple myeloma not having achieved remission (HCC) # RECURRENT Multiple myeloma stage III [high-risk cytogenetics-status post KRD-VGPR ; status post autologous stem cell transplant on 06/15/18.  Most recently progressed on salvage therapy with daratumumab  lenalidomide [15 mg 3 w; 1-w] dexamethasone. plasma cell myeloma by approximately 70% of  the  overall cellular marrow;  FEB 2024-lambda light chain 284 Bone marrow-70% plasma cells. M Protein 0.9-rising lambda light chain ratio. Discontinue Revlimid; and dara-given the progressive disease.  MARCH 2024- PET scan: No skeletal lesions noted. APRIl 2024- MRI-cervical/thoracic/lumbar spine-negative for any lesions in the spinal cord. ONchemotherapy with carfilzomib-dexamethasone [days 08/02/13 every 28 days] and Pomalyst [4 mg-Take 21 days on, 7 days off, repeat every 28 days. ].   # Given frequent infection [sino-pul]- HOLD chemo now. Plan to start Isa-Carfil-Dex in 2 weeks or so.  MARCH 2024- Left ventricular ejection fraction, by estimation, is 50 to 55%  # Frequent sinus/acute bronchitis infection-status post antibiotics improved.  IgG levels less than 400. Proceed with IVIG infusions 400 mg/kg dose once a month to prevent immunosuppression.  # Hypokalemia: mild- recommend Kdur TID; hypocalcemia- 8.0- vit D march 2024- 50- recommend ca+vit D BID.   # Left knee septic arthritis/immunocompromised host- [s/p Dr.Bowers; ID]; currently-S/p evaluation with ID-s/p antibioics- continue tramadol. Stable.  # Iatrogenic hypothyroidism [ Graves/goiter s/p total thyroidectomy;aug,2022 ]-currently on Synthroid 100 mcg/day-September 2023 TSH normal.  Stable.  # Lower back discomfort x 6 months-see above.  MRI lumbar spine as above.  Continue tramadol. Stable.  # Bone lesions /last zometa on 11/27/2017.    Dental extraction appointment appt-on Jan 26th.  PET scan evidence of any bone lesions.-Hold Zometa for now. Stable.  # IV access: mediport- functional  # Vaccination: Flu shot s/p;  s/p Pneumonia vaccination [march 2024].   6 w- given dental extrac- on 1/26. :MM panel; K/l light chain ratio q 4 W  # DISPOSITION:  # IVIG as planned tomorrow.  # as per IS-  follow up on June 3rd MD; labs-cbc/cmp; MM panel; K-L light chains; [new] Isatux-Carfil-Dex; D-2 carfil #  as per IS-  D-8;MD- labs- cbc/bmp;  and  d-9 - Dr.B      Orders Placed This Encounter  Procedures   CBC with Differential (Cancer Center Only)    Standing Status:   Future    Standing Expiration Date:   12/27/2023   CMP (Cancer Center only)    Standing Status:   Future    Standing Expiration Date:   12/27/2023   CBC with Differential (Cancer Center Only)    Standing Status:   Future    Standing Expiration Date:   01/03/2024   CMP (Cancer Center only)    Standing Status:   Future    Standing Expiration Date:   01/03/2024   Kappa/lambda light chains    Standing Status:   Future    Standing Expiration Date:   12/15/2023   Multiple Myeloma Panel (SPEP&IFE w/QIG)    Standing Status:   Future    Standing Expiration Date:   12/15/2023     All questions were answered. The patient knows to call the clinic with any problems, questions or concerns.      Earna Coder, MD 12/15/2022 10:13 AM

## 2022-12-15 NOTE — Progress Notes (Signed)
No concerns today 

## 2022-12-16 ENCOUNTER — Inpatient Hospital Stay: Payer: 59

## 2022-12-16 VITALS — BP 118/57 | HR 69 | Temp 99.0°F | Resp 16

## 2022-12-16 DIAGNOSIS — E876 Hypokalemia: Secondary | ICD-10-CM

## 2022-12-16 DIAGNOSIS — C9000 Multiple myeloma not having achieved remission: Secondary | ICD-10-CM

## 2022-12-16 DIAGNOSIS — C9002 Multiple myeloma in relapse: Secondary | ICD-10-CM | POA: Diagnosis not present

## 2022-12-16 MED ORDER — HEPARIN SOD (PORK) LOCK FLUSH 100 UNIT/ML IV SOLN
500.0000 [IU] | Freq: Once | INTRAVENOUS | Status: AC | PRN
  Administered 2022-12-16: 500 [IU]
  Filled 2022-12-16: qty 5

## 2022-12-16 MED ORDER — ACETAMINOPHEN 325 MG PO TABS
650.0000 mg | ORAL_TABLET | Freq: Once | ORAL | Status: AC
  Administered 2022-12-16: 650 mg via ORAL
  Filled 2022-12-16: qty 2

## 2022-12-16 MED ORDER — DEXTROSE 5 % IV SOLN
Freq: Once | INTRAVENOUS | Status: AC
  Filled 2022-12-16: qty 250

## 2022-12-16 MED ORDER — SODIUM CHLORIDE 0.9% FLUSH
10.0000 mL | Freq: Once | INTRAVENOUS | Status: AC | PRN
  Administered 2022-12-16: 10 mL
  Filled 2022-12-16: qty 10

## 2022-12-16 MED ORDER — IMMUNE GLOBULIN (HUMAN) 10 GM/100ML IV SOLN
400.0000 mg/kg | Freq: Once | INTRAVENOUS | Status: AC
  Administered 2022-12-16: 25 g via INTRAVENOUS
  Filled 2022-12-16: qty 250

## 2022-12-16 MED ORDER — DIPHENHYDRAMINE HCL 25 MG PO CAPS
25.0000 mg | ORAL_CAPSULE | Freq: Once | ORAL | Status: AC
  Administered 2022-12-16: 25 mg via ORAL
  Filled 2022-12-16: qty 1

## 2022-12-16 NOTE — Patient Instructions (Signed)

## 2022-12-17 ENCOUNTER — Other Ambulatory Visit: Payer: Self-pay

## 2022-12-23 ENCOUNTER — Telehealth: Payer: Self-pay | Admitting: *Deleted

## 2022-12-23 DIAGNOSIS — C9000 Multiple myeloma not having achieved remission: Secondary | ICD-10-CM

## 2022-12-23 MED ORDER — POMALIDOMIDE 4 MG PO CAPS
4.0000 mg | ORAL_CAPSULE | Freq: Every day | ORAL | 0 refills | Status: DC
Start: 2022-12-23 — End: 2023-05-11

## 2022-12-23 NOTE — Telephone Encounter (Signed)
Refill on pomalyst sent to [facility].

## 2022-12-24 ENCOUNTER — Other Ambulatory Visit: Payer: Self-pay

## 2022-12-24 MED FILL — Dexamethasone Sodium Phosphate Inj 100 MG/10ML: INTRAMUSCULAR | Qty: 2 | Status: AC

## 2022-12-27 ENCOUNTER — Inpatient Hospital Stay (HOSPITAL_BASED_OUTPATIENT_CLINIC_OR_DEPARTMENT_OTHER): Payer: 59 | Admitting: Internal Medicine

## 2022-12-27 ENCOUNTER — Inpatient Hospital Stay: Payer: 59

## 2022-12-27 ENCOUNTER — Inpatient Hospital Stay: Payer: 59 | Attending: Nurse Practitioner

## 2022-12-27 ENCOUNTER — Encounter: Payer: Self-pay | Admitting: Internal Medicine

## 2022-12-27 VITALS — BP 117/79 | HR 57 | Temp 98.0°F | Ht 62.0 in | Wt 171.7 lb

## 2022-12-27 VITALS — BP 111/70 | HR 75 | Temp 98.3°F | Resp 16

## 2022-12-27 DIAGNOSIS — Z5112 Encounter for antineoplastic immunotherapy: Secondary | ICD-10-CM | POA: Diagnosis present

## 2022-12-27 DIAGNOSIS — Z79620 Long term (current) use of immunosuppressive biologic: Secondary | ICD-10-CM | POA: Diagnosis not present

## 2022-12-27 DIAGNOSIS — C9000 Multiple myeloma not having achieved remission: Secondary | ICD-10-CM

## 2022-12-27 DIAGNOSIS — Z87891 Personal history of nicotine dependence: Secondary | ICD-10-CM | POA: Insufficient documentation

## 2022-12-27 DIAGNOSIS — C9002 Multiple myeloma in relapse: Secondary | ICD-10-CM | POA: Diagnosis present

## 2022-12-27 LAB — CBC WITH DIFFERENTIAL (CANCER CENTER ONLY)
Abs Immature Granulocytes: 0.01 10*3/uL (ref 0.00–0.07)
Basophils Absolute: 0 10*3/uL (ref 0.0–0.1)
Basophils Relative: 1 %
Eosinophils Absolute: 0.3 10*3/uL (ref 0.0–0.5)
Eosinophils Relative: 5 %
HCT: 29.8 % — ABNORMAL LOW (ref 36.0–46.0)
Hemoglobin: 9.4 g/dL — ABNORMAL LOW (ref 12.0–15.0)
Immature Granulocytes: 0 %
Lymphocytes Relative: 64 %
Lymphs Abs: 3.5 10*3/uL (ref 0.7–4.0)
MCH: 30.5 pg (ref 26.0–34.0)
MCHC: 31.5 g/dL (ref 30.0–36.0)
MCV: 96.8 fL (ref 80.0–100.0)
Monocytes Absolute: 0.4 10*3/uL (ref 0.1–1.0)
Monocytes Relative: 8 %
Neutro Abs: 1.2 10*3/uL — ABNORMAL LOW (ref 1.7–7.7)
Neutrophils Relative %: 22 %
Platelet Count: 254 10*3/uL (ref 150–400)
RBC: 3.08 MIL/uL — ABNORMAL LOW (ref 3.87–5.11)
RDW: 16.1 % — ABNORMAL HIGH (ref 11.5–15.5)
WBC Count: 5.5 10*3/uL (ref 4.0–10.5)
nRBC: 0 % (ref 0.0–0.2)

## 2022-12-27 LAB — CMP (CANCER CENTER ONLY)
ALT: 8 U/L (ref 0–44)
AST: 15 U/L (ref 15–41)
Albumin: 3.5 g/dL (ref 3.5–5.0)
Alkaline Phosphatase: 64 U/L (ref 38–126)
Anion gap: 9 (ref 5–15)
BUN: 15 mg/dL (ref 8–23)
CO2: 24 mmol/L (ref 22–32)
Calcium: 8.5 mg/dL — ABNORMAL LOW (ref 8.9–10.3)
Chloride: 106 mmol/L (ref 98–111)
Creatinine: 0.86 mg/dL (ref 0.44–1.00)
GFR, Estimated: >60 mL/min
Glucose, Bld: 107 mg/dL — ABNORMAL HIGH (ref 70–99)
Potassium: 3.4 mmol/L — ABNORMAL LOW (ref 3.5–5.1)
Sodium: 139 mmol/L (ref 135–145)
Total Bilirubin: 0.4 mg/dL (ref 0.3–1.2)
Total Protein: 8.1 g/dL (ref 6.5–8.1)

## 2022-12-27 LAB — TYPE AND SCREEN
ABO/RH(D): O POS
Antibody Screen: NEGATIVE

## 2022-12-27 MED ORDER — FAMOTIDINE 20 MG IN NS 100 ML IVPB
20.0000 mg | Freq: Once | INTRAVENOUS | Status: AC
  Administered 2022-12-27: 20 mg via INTRAVENOUS
  Filled 2022-12-27: qty 100

## 2022-12-27 MED ORDER — TRAMADOL HCL 50 MG PO TABS
50.0000 mg | ORAL_TABLET | Freq: Two times a day (BID) | ORAL | 0 refills | Status: DC | PRN
Start: 2022-12-27 — End: 2023-03-23

## 2022-12-27 MED ORDER — MONTELUKAST SODIUM 10 MG PO TABS
10.0000 mg | ORAL_TABLET | Freq: Once | ORAL | Status: AC
  Administered 2022-12-27: 10 mg via ORAL
  Filled 2022-12-27: qty 1

## 2022-12-27 MED ORDER — HEPARIN SOD (PORK) LOCK FLUSH 100 UNIT/ML IV SOLN
INTRAVENOUS | Status: AC
  Administered 2022-12-27: 500 [IU]
  Filled 2022-12-27: qty 5

## 2022-12-27 MED ORDER — ACETAMINOPHEN 325 MG PO TABS
650.0000 mg | ORAL_TABLET | Freq: Once | ORAL | Status: AC
  Administered 2022-12-27: 650 mg via ORAL
  Filled 2022-12-27: qty 2

## 2022-12-27 MED ORDER — SODIUM CHLORIDE 0.9 % IV SOLN
10.0000 mg/kg | Freq: Once | INTRAVENOUS | Status: AC
  Administered 2022-12-27: 800 mg via INTRAVENOUS
  Filled 2022-12-27: qty 25

## 2022-12-27 MED ORDER — DEXTROSE 5 % IV SOLN
20.0000 mg/m2 | Freq: Once | INTRAVENOUS | Status: AC
  Administered 2022-12-27: 40 mg via INTRAVENOUS
  Filled 2022-12-27: qty 15

## 2022-12-27 MED ORDER — DIPHENHYDRAMINE HCL 50 MG/ML IJ SOLN
50.0000 mg | Freq: Once | INTRAMUSCULAR | Status: AC
  Administered 2022-12-27: 50 mg via INTRAVENOUS
  Filled 2022-12-27: qty 1

## 2022-12-27 MED ORDER — FAMOTIDINE IN NACL 20-0.9 MG/50ML-% IV SOLN: 20.0000 mg | Freq: Once | INTRAVENOUS | Status: DC

## 2022-12-27 MED ORDER — SODIUM CHLORIDE 0.9 % IV SOLN
Freq: Once | INTRAVENOUS | Status: AC
  Filled 2022-12-27: qty 250

## 2022-12-27 MED ORDER — HEPARIN SOD (PORK) LOCK FLUSH 100 UNIT/ML IV SOLN
500.0000 [IU] | Freq: Once | INTRAVENOUS | Status: AC | PRN
  Filled 2022-12-27: qty 5

## 2022-12-27 MED ORDER — SODIUM CHLORIDE 0.9 % IV SOLN
20.0000 mg | Freq: Once | INTRAVENOUS | Status: AC
  Administered 2022-12-27: 20 mg via INTRAVENOUS
  Filled 2022-12-27: qty 20

## 2022-12-27 MED FILL — Dexamethasone Sodium Phosphate Inj 100 MG/10ML: INTRAMUSCULAR | Qty: 2 | Status: AC

## 2022-12-27 NOTE — Patient Instructions (Signed)
[facility]  Discharge Instructions: Thank you for choosing [facility] to provide your oncology and hematology care.  If you have a lab appointment with the Cancer Center, please go directly to the Cancer Center and check in at the registration area.  Wear comfortable clothing and clothing appropriate for easy access to any Portacath or PICC line.   We strive to give you quality time with your provider. You may need to reschedule your appointment if you arrive late (15 or more minutes).  Arriving late affects you and other patients whose appointments are after yours.  Also, if you miss three or more appointments without notifying the office, you may be dismissed from the clinic at the provider's discretion.      For prescription refill requests, have your pharmacy contact our office and allow 72 hours for refills to be completed.    Today you received the following chemotherapy and/or immunotherapy agents Kyprolis, Sarclisa      To help prevent nausea and vomiting after your treatment, we encourage you to take your nausea medication as directed.  BELOW ARE SYMPTOMS THAT SHOULD BE REPORTED IMMEDIATELY: *FEVER GREATER THAN 100.4 F (38 C) OR HIGHER *CHILLS OR SWEATING *NAUSEA AND VOMITING THAT IS NOT CONTROLLED WITH YOUR NAUSEA MEDICATION *UNUSUAL SHORTNESS OF BREATH *UNUSUAL BRUISING OR BLEEDING *URINARY PROBLEMS (pain or burning when urinating, or frequent urination) *BOWEL PROBLEMS (unusual diarrhea, constipation, pain near the anus) TENDERNESS IN MOUTH AND THROAT WITH OR WITHOUT PRESENCE OF ULCERS (sore throat, sores in mouth, or a toothache) UNUSUAL RASH, SWELLING OR PAIN  UNUSUAL VAGINAL DISCHARGE OR ITCHING   Items with * indicate a potential emergency and should be followed up as soon as possible or go to the Emergency Department if any problems should occur.  Please show the CHEMOTHERAPY ALERT CARD or IMMUNOTHERAPY ALERT CARD at  check-in to the Emergency Department and triage nurse.  Should you have questions after your visit or need to cancel or reschedule your appointment, please contact [facility]  [phone] and follow the prompts.  Office hours are 8:00 a.m. to 4:30 p.m. Monday - Friday. Please note that voicemails left after 4:00 p.m. may not be returned until the following business day.  We are closed weekends and major holidays. You have access to a nurse at all times for urgent questions. Please call the main number to the clinic [phone] and follow the prompts.  For any non-urgent questions, you may also contact your provider using MyChart. We now offer e-Visits for anyone 80 and older to request care online for non-urgent symptoms. For details visit [email or web address].   Also download the MyChart app! Go to the app store, search "MyChart", open the app, select Day, and log in with your MyChart username and password.   Isatuximab Injection What is this medication? ISATUXIMAB (EYE sa TUX i mab) treats a type of bone marrow cancer (multiple myeloma). It works by blocking a protein that causes cancer cells to grow and multiply. This helps slow or stop the spread of cancer cells. This medicine may be used for other purposes; ask your health care provider or pharmacist if you have questions. COMMON BRAND NAME(S): SARCLISA What should I tell my care team before I take this medication? They need to know if you have any of these conditions: Heart disease Infection especially a viral infection, such as chickenpox, cold sores, herpes An unusual or allergic reaction to isatuximab, other  medications, foods, dyes, or preservatives Pregnant or trying to get pregnant Breast-feeding How should I use this medication? This medication is injected into a vein. It is given by your care team in a hospital or clinic setting. Talk to your care team about the use of this  medication in children. Special care may be needed. Overdosage: If you think you have taken too much of this medicine contact a poison control center or emergency room at once. NOTE: This medicine is only for you. Do not share this medicine with others. What if I miss a dose? Keep appointments for follow-up doses. It is important not to miss your dose. Call your care team if you are unable to keep an appointment. What may interact with this medication? Interactions have not been studied. This list may not describe all possible interactions. Give your health care provider a list of all the medicines, herbs, non-prescription drugs, or dietary supplements you use. Also tell them if you smoke, drink alcohol, or use illegal drugs. Some items may interact with your medicine. What should I watch for while using this medication? Your condition will be monitored carefully while you are receiving this medication. You may need blood work done while you are taking this medication. This medication may increase your risk of getting an infection. Call your care team for advice if you get a fever, chills, sore throat, or other symptoms of a cold or flu. Do not treat yourself. Try to avoid being around people who are sick. If you have not had the measles or chickenpox vaccines, tell your care team right away if you are around someone with these viruses. Talk to your care team about your risk of cancer. You may be more at risk for certain types of cancers if you take this medication. This medication can affect the results of blood tests to match your blood type. Your care team will do blood tests to match your blood type before you start treatment. Tell your care team that you are being treated with this medication before receiving a blood transfusion. Do not become pregnant while taking this medicine or for at least 5 months after stopping it. Inform your doctor if you wish to become pregnant or think you might be  pregnant. There is a potential for serious side effects to an unborn child. Talk to your care team for more information. Do not breast-feed while taking this medicine. What side effects may I notice from receiving this medication? Side effects that you should report to your care team as soon as possible: Allergic reactions--skin rash, itching, hives, swelling of the face, lips, tongue, or throat Heart failure--shortness of breath, swelling of the angles, feet, or hands, sudden weight gain, unusual weakness or fatigue Infection--fever, chills, cough, or sore throat Infusion reactions--chest pain, shortness of breath or trouble breathing, feeling faint or lightheaded Side effects that usually do not require medical attention (report these to your care team if they continue or are bothersome): Back pain Diarrhea Increase in blood pressure Trouble sleeping Vomiting This list may not describe all possible side effects. Call your doctor for medical advice about side effects. You may report side effects to FDA at 1-800-FDA-1088. Where should I keep my medication? This medication is given in a hospital or clinic. It will not be stored at home. NOTE: This sheet is a summary. It may not cover all possible information. If you have questions about this medicine, talk to your doctor, pharmacist, or health care provider.  2024 Elsevier/Gold Standard (2021-06-12 00:00:00)

## 2022-12-27 NOTE — Progress Notes (Signed)
[facility] OFFICE PROGRESS NOTE  Patient Care Team: Leanna Sato, MD as PCP - General (Family Medicine) End, Cristal Deer, MD as PCP - Cardiology (Cardiology) Eddie Candle, [name] Earna Coder, MD as Consulting Physician (Hematology and Oncology) Vida Rigger, MD as Consulting Physician (Pulmonary Disease) Tedd Sias Marlana Salvage, MD as Consulting Physician (Endocrinology)   Cancer Staging  No matching staging information was found for the patient.  Oncology History Overview Note  # SEP 2018- MULTIPLE MYELOMA IgALamda [2.5 gm/dl; K/L= 02/6577]; STAGE III [beta 2 microglobulin=5.5] [presented with acute renal failure; anemia; NO hypercalcemia; Skeletal survey-Normal]; BMBx- 45% plasma cells; FISH-POSITIVE 11:14 translocation.[STANDARD-high RISK]/cyto-Normal; SEP 2018- PET- L3 posterior element lesion.   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 No morphologic support for residual myeloma disease. Negative for minimal residual disease by MM-MRD flow cytometry; HOLD REVLIMID [leg swelling]; AUG 2021-PET scan negative for myeloma; continue to hold Revlimid  # MARCH 2021-diastolic congestive heart failure [Dr.End]  # BMBx- OCT 2021- BMBx- 5% plasmacytosis-however this appears to be more polyclonal rather than monoclonal-not explain patient worsening anemia.   # OCT 2022- 18th- Dara- Rev-Dex; MARCH, 2024- PROGRESSION bone marrow biopsy  70% plasma cells; PET scan no bone lesions-however conus medullaris uptake-MRI lumbar spine-   # MAY 2024- DISCONTINUE carfilzomib-dexamethasone- Pomalyst; [rising M-protein; MAY 2024- 1.3. ]   # start 12/27/2022 -Isa-Carfil-Dex   #November 2021-hyperthyroidism/goiter- [D.Bennett/Dr.Solum]-methimazole. S/p Thyroidectomy [Dr.Kim; [facility]-AUG 2022]  --------------------------  # 12/12- RIGHT JUGULAR DVT-x 80m on xarelto; finished April 2020; September 2020-EGD/dysphagia; Dr. Tobi Bastos  # Acute renal failure [Dr.Singh; Proteinuria 1.5gm/day ]; acyclovir/Asprin ------------------------------------------------------------------------------------------------------------   DIAGNOSIS: [ ]  MULTIPLE MYELOMA  STAGE: III/HIGH RISK ;GOALS: CONTROL      Multiple myeloma not having achieved remission (HCC)  11/21/2017 - 04/28/2018 Chemotherapy   Patient is on Treatment Plan : MYELOMA SALVAGE Cyclophosphamide / Carfilzomib / Dexamethasone (CCd) q28d     05/12/2021 - 02/25/2022 Chemotherapy   Patient is on Treatment Plan : MYELOMA RELAPSED REFRACTORY Daratumumab SQ + Lenalidomide + Dexamethasone (DaraRd) q28d     05/12/2021 - 07/23/2022 Chemotherapy   Patient is on Treatment Plan : MYELOMA RELAPSED REFRACTORY Daratumumab SQ + Lenalidomide + Dexamethasone (DaraRd) q28d     11/02/2022 - 11/16/2022 Chemotherapy   Patient is on Treatment Plan : MYELOMA RELAPSED/ REFRACTORY Carfilzomib D1,8,15 (20/27) + Pomalidomide + Dexamethasone (KPd) q28d     12/27/2022 -  Chemotherapy   Patient is on Treatment Plan : MYELOMA Carfilzomib + Dexamethasone + Isatuximab q28d      INTERVAL HISTORY: Ambulating in a wheel chair.   Alone.  Brandi [name] 72 y.o.  female pleasant patient above history of relapsed multiple myeloma--currently noted to have progression on Carfil-Pom-Dex.   Patient treatments on  hold  because of ongoing cough bronchitis-like symptoms.  Treated with antibiotics/steroids-s/p IVIGx2 .  Symptoms currently  improved. denies any worsening pain.  Patient taking tramadol as needed for leg cramps.    Review of Systems  Constitutional:  Positive for malaise/fatigue. Negative for chills, diaphoresis and  fever.  HENT:  Negative for nosebleeds and sore throat.   Eyes:  Negative for double vision.  Respiratory:  Positive for cough and shortness of breath. Negative for hemoptysis.   Cardiovascular:  Negative for chest pain, palpitations and orthopnea.  Gastrointestinal:  Negative for abdominal pain, blood in stool, constipation, heartburn and melena.  Genitourinary:  Negative for dysuria, frequency and urgency.  Musculoskeletal:  Positive for back pain.  Skin: Negative.  Negative for itching and rash.  Neurological:  Negative for dizziness, tingling, focal weakness, weakness and headaches.  Endo/Heme/Allergies:  Does not bruise/bleed easily.  Psychiatric/Behavioral:  Negative for depression. The patient is not nervous/anxious.     PAST MEDICAL HISTORY :  Past Medical History:  Diagnosis Date   (HFpEF) heart failure with preserved ejection fraction (HCC)    a. 06/2020 Echo: EF 60-65%, no rwma, nl RV size/fxn. Triv MR. Triv TR/AI. Mod elev PASP.   Anxiety    COPD (chronic obstructive pulmonary disease) (HCC)    GERD (gastroesophageal reflux disease)    Hypertension    Hyperthyroidism    Hypokalemia    IgA myeloma (HCC)    Multiple myeloma (HCC)    Pneumonia    Red blood cell antibody positive, compatible PRBC difficult to obtain    S/P autologous bone marrow transplantation (HCC)     PAST SURGICAL HISTORY :   Past Surgical History:  Procedure Laterality Date   ANTERIOR VITRECTOMY Left 10/07/2021   Procedure: ANTERIOR VITRECTOMY;  Surgeon: Lockie Mola, MD;  Location: [facility];  Service: Ophthalmology;  Laterality: Left;   CATARACT EXTRACTION W/PHACO Left 10/07/2021   Procedure: CATARACT EXTRACTION PHACO AND INTRAOCULAR LENS PLACEMENT (IOC) LEFT VISION BLUE;  Surgeon:  Lockie Mola, MD;  Location: [facility];  Service: Ophthalmology;  Laterality: Left;  kit for manual incision 14.56 01:21.4   CATARACT EXTRACTION W/PHACO Right 10/21/2021   Procedure: CATARACT EXTRACTION PHACO AND INTRAOCULAR LENS PLACEMENT (IOC) RIGHT 11.12 01:48.1;  Surgeon: Lockie Mola, MD;  Location: [facility];  Service: Ophthalmology;  Laterality: Right;   ESOPHAGOGASTRODUODENOSCOPY (EGD) WITH PROPOFOL N/A 03/30/2019   Procedure: ESOPHAGOGASTRODUODENOSCOPY (EGD) WITH PROPOFOL;  Surgeon: Wyline Mood, MD;  Location: [facility];  Service: Gastroenterology;  Laterality: N/A;   IR FLUORO GUIDE PORT INSERTION RIGHT  12/16/2017   IR IMAGING GUIDED PORT INSERTION  06/15/2021   KNEE ARTHROSCOPY Left 09/03/2022   Procedure: ARTHROSCOPY KNEE DEBRIDEMENT;  Surgeon: Lyndle Herrlich, MD;  Location: [facility] ORS;  Service: Orthopedics;  Laterality: Left;   THYROIDECTOMY N/A     FAMILY HISTORY :   Family History  Problem Relation Age of Onset   Pneumonia Mother    Seizures Father     SOCIAL HISTORY:   Social History   Tobacco Use   Smoking status: Former    Packs/day: .25    Types: Cigarettes    Quit date: 03/18/2021    Years since quitting: 1.7   Smokeless tobacco: Never   Tobacco comments:    2 cigarettes QOD  Vaping Use   Vaping Use: Never used  Substance Use Topics   Alcohol use: No   Drug use: No    ALLERGIES:  has No Known Allergies.  MEDICATIONS:  Current Outpatient Medications  Medication Sig Dispense Refill   acyclovir (ZOVIRAX) 400 MG tablet Take 1 tablet by mouth twice daily 60 tablet 0   albuterol (VENTOLIN HFA) 108 (90 Base) MCG/ACT inhaler Inhale 2 puffs into the lungs every 6 (six) hours as needed for wheezing or  shortness of breath. 8 g 3   aspirin EC 81 MG EC tablet Take 1 tablet (81 mg total) by mouth daily. 90 tablet 3   benzonatate (TESSALON) 100 MG capsule TAKE 1 CAPSULE BY MOUTH TWICE DAILY AS NEEDED FOR COUGH 40 capsule 0    calcium-vitamin D (OSCAL WITH D) 500MG -200UNIT ( ) tablet Take 1 tablet by mouth 2 (two) times daily. 60 tablet 0   carvedilol (COREG) 3.125 MG tablet Take 1 tablet (3.125 mg total) by mouth 2 (two) times daily with a meal. 60 tablet 5   furosemide (LASIX) 40 MG tablet Take 1 tablet (40 mg total) by mouth daily. 90 tablet 3   levothyroxine (SYNTHROID) 100 MCG tablet TAKE 1 TABLET BY MOUTH BEFORE BREAKFAST 90 tablet 0   lidocaine-prilocaine (EMLA) cream Apply 1 application topically as needed. Apply to port and cover with saran wrap 1-2 hours prior to port access 30 g 1   magnesium oxide (MAG-OX) 400 (240 Mg) MG tablet Take 1 tablet (400 mg total) by mouth 2 (two) times daily. 60 tablet 3   methylPREDNISolone (MEDROL DOSEPAK) 4 MG TBPK tablet Use as directed. 21 tablet 1   montelukast (SINGULAIR) 10 MG tablet Take 1 tablet (10 mg total) by mouth at bedtime. 30 tablet 3   ondansetron (ZOFRAN) 8 MG tablet One pill every 8 hours as needed for nausea/vomitting. 40 tablet 1   polyethylene glycol (MIRALAX / GLYCOLAX) packet Take 17 g by mouth daily as needed for mild constipation. 14 each 0   pomalidomide (POMALYST) 4 MG capsule Take 1 capsule (4 mg total) by mouth daily. Take for 21 days, then old for 7 days. Repeat every 28 days. 21 capsule 0   potassium chloride SA (KLOR-CON M) 20 MEQ tablet Take 1 tablet (20 mEq total) by mouth 2 (two) times daily. 60 tablet 6   spironolactone (ALDACTONE) 25 MG tablet Take 1 tablet by mouth once daily 90 tablet 3   Vitamin D, Ergocalciferol, (DRISDOL) 1.25 MG (50000 UNIT) CAPS capsule Take 1 capsule (50,000 Units total) by mouth once a week. 12 capsule 0   pantoprazole (PROTONIX) 40 MG tablet Take 1 tablet (40 mg total) by mouth daily. 90 tablet 0   traMADol (ULTRAM) 50 MG tablet Take 1 tablet (50 mg total) by mouth every 12 (twelve) hours as needed. 60 tablet 0   No current facility-administered medications for this visit.   Facility-Administered Medications  Ordered in Other Visits  Medication Dose Route Frequency Provider Last Rate Last Admin   0.9 %  sodium chloride infusion   Intravenous Continuous Ralene Muskrat, PA-C        PHYSICAL EXAMINATION:   BP 117/79 (BP Location: Left Arm, Patient Position: Sitting, Cuff Size: Normal)   Pulse (!) 57   Temp 98 F (36.7 C)   Ht 5\' 2"  (1.575 m)   Wt 171 lb 11.2 oz (77.9 kg)   SpO2 100%   BMI 31.40 kg/m   Filed Weights   12/27/22 0840  Weight: 171 lb 11.2 oz (77.9 kg)   Bilateral coarse breath sounds.   Physical Exam HENT:     Head: Normocephalic and atraumatic.  Eyes:     Pupils: Pupils are equal, round, and reactive to light.  Cardiovascular:     Rate and Rhythm: Normal rate and regular rhythm.  Pulmonary:     Effort: Pulmonary effort is normal. No respiratory distress.     Breath sounds: Normal breath sounds. No wheezing.  Abdominal:  General: Bowel sounds are normal. There is no distension.     Palpations: Abdomen is soft. There is no mass.     Tenderness: There is no abdominal tenderness. There is no guarding or rebound.  Musculoskeletal:        General: No tenderness. Normal range of motion.     Cervical back: Normal range of motion and neck supple.  Skin:    General: Skin is warm.  Neurological:     Mental Status: She is alert and oriented to person, place, and time.  Psychiatric:        Mood and Affect: Affect normal.    LABORATORY DATA:  I have reviewed the data as listed    Component Value Date/Time   NA 139 12/27/2022 0849   NA 141 01/25/2020 1530   K 3.4 (L) 12/27/2022 0849   CL 106 12/27/2022 0849   CO2 24 12/27/2022 0849   GLUCOSE 107 (H) 12/27/2022 0849   BUN 15 12/27/2022 0849   BUN 19 01/25/2020 1530   CREATININE 0.86 12/27/2022 0849   CALCIUM 8.5 (L) 12/27/2022 0849   PROT 8.1 12/27/2022 0849   ALBUMIN 3.5 12/27/2022 0849   AST 15 12/27/2022 0849   ALT 8 12/27/2022 0849   ALKPHOS 64 12/27/2022 0849   BILITOT 0.4 12/27/2022 0849   GFRNONAA  >60 12/27/2022 0849   GFRAA NOT CALCULATED 04/17/2020 1352    No results found for: "SPEP", "UPEP"  Lab Results  Component Value Date   WBC 5.5 12/27/2022   NEUTROABS 1.2 (L) 12/27/2022   HGB 9.4 (L) 12/27/2022   HCT 29.8 (L) 12/27/2022   MCV 96.8 12/27/2022   PLT 254 12/27/2022      Chemistry      Component Value Date/Time   NA 139 12/27/2022 0849   NA 141 01/25/2020 1530   K 3.4 (L) 12/27/2022 0849   CL 106 12/27/2022 0849   CO2 24 12/27/2022 0849   BUN 15 12/27/2022 0849   BUN 19 01/25/2020 1530   CREATININE 0.86 12/27/2022 0849      Component Value Date/Time   CALCIUM 8.5 (L) 12/27/2022 0849   ALKPHOS 64 12/27/2022 0849   AST 15 12/27/2022 0849   ALT 8 12/27/2022 0849   BILITOT 0.4 12/27/2022 0849      (H): Data is abnormally high   Latest Reference Range & Units Most Recent 12/24/20 14:01 04/01/21 14:34 04/28/21 09:17 06/11/21 09:42 07/03/21 10:32 08/20/21 10:06  M Protein SerPl Elph-Mcnc Not Observed g/dL 1.1 (H) (C) 9/60/45 40:98 Not Observed (C) 0.6 (H) (C) 0.7 (H) (C) 0.5 (H) (C) 0.7 (H) (C) 1.1 (H) (C)    Latest Reference Range & Units Most Recent 09/17/21 08:47 10/15/21 08:25 11/12/21 08:55  Kappa free light chain 3.3 - 19.4 mg/L 5.0 11/12/21 08:55 7.2 7.2 5.0  Lambda free light chains 5.7 - 26.3 mg/L 223.6 (H) 11/12/21 08:55 215.3 (H) 340.2 (H) 223.6 (H)  Kappa, lambda light chain ratio 0.26 - 1.65  0.02 (L) 11/12/21 08:55 0.03 (L) 0.02 (L) 0.02 (L)  (H): Data is abnormally high (L): Data is abnormally low  RADIOGRAPHIC STUDIES: I have personally reviewed the radiological images as listed and agreed with the findings in the report. No results found.   ASSESSMENT & PLAN:  Multiple myeloma not having achieved remission (HCC) # RECURRENT Multiple myeloma stage III [high-risk cytogenetics-status post KRD-VGPR ; status post autologous stem cell transplant on 06/15/18.  Most recently progressed on salvage therapy with daratumumab  lenalidomide [15  mg 3 w;  1-w] dexamethasone. plasma cell myeloma by approximately 70% of the overall cellular marrow;  FEB 2024-lambda light chain 284 Bone marrow-70% plasma cells. M Protein 0.9-rising lambda light chain ratio. Discontinue Revlimid; and dara-given the progressive disease.  MARCH 2024- PET scan: No skeletal lesions noted. APRIl 2024- MRI-cervical/thoracic/lumbar spine-negative for any lesions in the spinal cord. DISCONTINUE carfilzomib-dexamethasone- [rising M-protein; MAY 2024- 1.3. ] Pomalyst; start 12/27/2022-Isa-Carfil-Dex [[facility]]  #  12/27/2022-Isa-Dex; d-2carfil weekly weeklyx4q 28 days  [[facility]] [facility] 2024- Left ventricular ejection fraction, by estimation, is 50 to 55%  # Frequent sinus/acute bronchitis infection-status post antibiotics improved. Septic arthritis-[April 2024-; Dr.Bowers] -IgG levels less than 400.s/p with IVIG infusions 400 mg/kg x2 - no infections- stable.  # Hypokalemia: mild- recommend Kdur TID; hypocalcemia- 8.0- vit D march 2024- 50- recommend ca+vit D BID.   # Left knee septic arthritis/immunocompromised host- [s/p Dr.Bowers; ID]; currently-S/p evaluation with ID-s/p antibioics- continue tramadol. Stable.  # Iatrogenic hypothyroidism [ Graves/goiter s/p total thyroidectomy;aug,2022 ]-currently on Synthroid 100 mcg/day-September 2023 TSH normal.  Stable.  # Lower back discomfort x 6 months-see above.  MRI lumbar spine as above.  Continue tramadol.  Stable.  # Bone lesions /last zometa on 11/27/2017.    Dental extraction appointment appt-on Jan 26th.  PET scan evidence of any bone lesions.-Hold Zometa for now.  Stable.  # IV access: mediport- functional  # Vaccination: Flu shot s/p;  s/p Pneumonia vaccination [march 2024].   6 w- given dental extrac- on 1/26. :MM panel; K/l light chain ratio q 4 W D-isa-dex;car1&2-car; weekly x 4 q 28 day cycle  # DISPOSITION:  # Q 4 weeks; MM panel; K/L light chain ratio # as per IS-  chemo  # As per IS- MD; labs; infusion appt-  Dr.B      Orders Placed This Encounter  Procedures   CBC with Differential (Cancer Center Only)    Standing Status:   Future    Standing Expiration Date:   01/10/2024   CMP (Cancer Center only)    Standing Status:   Future    Standing Expiration Date:   01/10/2024   CBC with Differential (Cancer Center Only)    Standing Status:   Future    Standing Expiration Date:   01/17/2024   Kappa/lambda light chains    Standing Status:   Future    Standing Expiration Date:   12/27/2023   Multiple Myeloma Panel (SPEP&IFE w/QIG)    Standing Status:   Future    Standing Expiration Date:   12/27/2023     All questions were answered. The patient knows to call the clinic with any problems, questions or concerns.      Earna Coder, MD 12/27/2022 10:08 AM

## 2022-12-27 NOTE — Assessment & Plan Note (Addendum)
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  Most recently progressed on salvage therapy with daratumumab  lenalidomide [15 mg 3 w; 1-w] dexamethasone. plasma cell myeloma by approximately 70% of the overall cellular marrow;  FEB 2024-lambda light chain 284 Bone marrow-70% plasma cells. M Protein 0.9-rising lambda light chain ratio. Discontinue Revlimid; and dara-given the progressive disease.  MARCH 2024- PET scan: No skeletal lesions noted. APRIl 2024- MRI-cervical/thoracic/lumbar spine-negative for any lesions in the spinal cord. DISCONTINUE carfilzomib-dexamethasone- [rising M-protein; MAY 2024- 1.3. ] Pomalyst; start 12/27/2022-Isa-Carfil-Dex [[facility]]  #  12/27/2022-Isa-Dex; d-2carfil weekly weeklyx4q 28 days  [[facility]] [facility] 2024- Left ventricular ejection fraction, by estimation, is 50 to 55%  # Frequent sinus/acute bronchitis infection-status post antibiotics improved. Septic arthritis-[April 2024-; Dr.Bowers] -IgG levels less than 400.s/p with IVIG infusions 400 mg/kg x2 - no infections- stable.  # Hypokalemia: mild- recommend Kdur TID; hypocalcemia- 8.0- vit D march 2024- 50- recommend ca+vit D BID.   # Left knee septic arthritis/immunocompromised host- [s/p Dr.Bowers; ID]; currently-S/p evaluation with ID-s/p antibioics- continue tramadol. Stable.  # Iatrogenic hypothyroidism [ Graves/goiter s/p total thyroidectomy;aug,2022 ]-currently on Synthroid 100 mcg/day-September 2023 TSH normal.  Stable.  # Lower back discomfort x 6 months-see above.  MRI lumbar spine as above.  Continue tramadol.  Stable.  # Bone lesions /last zometa on 11/27/2017.    Dental extraction appointment appt-on Jan 26th.  PET scan evidence of any bone lesions.-Hold Zometa for now.  Stable.  # IV access: mediport- functional  # Vaccination: Flu shot s/p;  s/p Pneumonia vaccination [march 2024].   6 w- given dental extrac- on  1/26. :MM panel; K/l light chain ratio q 4 W D-isa-dex;car1&2-car; weekly x 4 q 28 day cycle  # DISPOSITION:  # Q 4 weeks; MM panel; K/L light chain ratio # as per IS-  chemo  # As per IS- MD; labs; infusion appt- Dr.B

## 2022-12-27 NOTE — Progress Notes (Signed)
Needs refill of tramadol

## 2022-12-27 NOTE — Patient Instructions (Signed)
#   DO NOT TAKE POMLAYST/cancer medication as we changed your treatments.

## 2022-12-28 ENCOUNTER — Other Ambulatory Visit: Payer: Self-pay

## 2022-12-28 ENCOUNTER — Inpatient Hospital Stay: Payer: 59

## 2022-12-28 VITALS — BP 145/81 | HR 56 | Temp 98.1°F | Resp 16

## 2022-12-28 DIAGNOSIS — C9000 Multiple myeloma not having achieved remission: Secondary | ICD-10-CM

## 2022-12-28 DIAGNOSIS — Z5112 Encounter for antineoplastic immunotherapy: Secondary | ICD-10-CM | POA: Diagnosis not present

## 2022-12-28 LAB — KAPPA/LAMBDA LIGHT CHAINS
Kappa free light chain: 3.2 mg/L — ABNORMAL LOW (ref 3.3–19.4)
Kappa, lambda light chain ratio: 0.01 — ABNORMAL LOW (ref 0.26–1.65)
Lambda free light chains: 307.6 mg/L — ABNORMAL HIGH (ref 5.7–26.3)

## 2022-12-28 MED ORDER — SODIUM CHLORIDE 0.9% FLUSH
10.0000 mL | INTRAVENOUS | Status: DC | PRN
  Administered 2022-12-28: 10 mL
  Filled 2022-12-28: qty 10

## 2022-12-28 MED ORDER — SODIUM CHLORIDE 0.9 % IV SOLN
Freq: Once | INTRAVENOUS | Status: AC
  Filled 2022-12-28: qty 250

## 2022-12-28 MED ORDER — SODIUM CHLORIDE 0.9 % IV SOLN
20.0000 mg | Freq: Once | INTRAVENOUS | Status: AC
  Administered 2022-12-28: 20 mg via INTRAVENOUS
  Filled 2022-12-28: qty 20

## 2022-12-28 MED ORDER — DEXTROSE 5 % IV SOLN
20.0000 mg/m2 | Freq: Once | INTRAVENOUS | Status: AC
  Administered 2022-12-28: 40 mg via INTRAVENOUS
  Filled 2022-12-28: qty 15

## 2022-12-28 MED ORDER — HEPARIN SOD (PORK) LOCK FLUSH 100 UNIT/ML IV SOLN
500.0000 [IU] | Freq: Once | INTRAVENOUS | Status: AC | PRN
  Administered 2022-12-28: 500 [IU]
  Filled 2022-12-28: qty 5

## 2022-12-28 NOTE — Patient Instructions (Signed)
[facility] [place] [facility]  Discharge Instructions: Thank you for choosing [facility] to provide your oncology and hematology care.  If you have a lab appointment with the Cancer Center, please go directly to the Cancer Center and check in at the registration area.  Wear comfortable clothing and clothing appropriate for easy access to any Portacath or PICC line.   We strive to give you quality time with your provider. You may need to reschedule your appointment if you arrive late (15 or more minutes).  Arriving late affects you and other patients whose appointments are after yours.  Also, if you miss three or more appointments without notifying the office, you may be dismissed from the clinic at the provider's discretion.      For prescription refill requests, have your pharmacy contact our office and allow 72 hours for refills to be completed.     To help prevent nausea and vomiting after your treatment, we encourage you to take your nausea medication as directed.  BELOW ARE SYMPTOMS THAT SHOULD BE REPORTED IMMEDIATELY: *FEVER GREATER THAN 100.4 F (38 C) OR HIGHER *CHILLS OR SWEATING *NAUSEA AND VOMITING THAT IS NOT CONTROLLED WITH YOUR NAUSEA MEDICATION *UNUSUAL SHORTNESS OF BREATH *UNUSUAL BRUISING OR BLEEDING *URINARY PROBLEMS (pain or burning when urinating, or frequent urination) *BOWEL PROBLEMS (unusual diarrhea, constipation, pain near the anus) TENDERNESS IN MOUTH AND THROAT WITH OR WITHOUT PRESENCE OF ULCERS (sore throat, sores in mouth, or a toothache) UNUSUAL RASH, SWELLING OR PAIN  UNUSUAL VAGINAL DISCHARGE OR ITCHING   Items with * indicate a potential emergency and should be followed up as soon as possible or go to the Emergency Department if any problems should occur.  Please show the CHEMOTHERAPY ALERT CARD or IMMUNOTHERAPY ALERT CARD at check-in to the Emergency Department and triage nurse.  Should you have questions after your visit  or need to cancel or reschedule your appointment, please contact [facility] [place] [facility]  [phone] and follow the prompts.  Office hours are 8:00 a.m. to 4:30 p.m. Monday - Friday. Please note that voicemails left after 4:00 p.m. may not be returned until the following business day.  We are closed weekends and major holidays. You have access to a nurse at all times for urgent questions. Please call the main number to the clinic [phone] and follow the prompts.  For any non-urgent questions, you may also contact your provider using MyChart. We now offer e-Visits for anyone 18 and older to request care online for non-urgent symptoms. For details visit [email or web address].   Also download the MyChart app! Go to the app store, search "MyChart", open the app, select [place], and log in with your MyChart username and password.    

## 2022-12-31 MED FILL — Dexamethasone Sodium Phosphate Inj 100 MG/10ML: INTRAMUSCULAR | Qty: 2 | Status: AC

## 2023-01-01 ENCOUNTER — Other Ambulatory Visit: Payer: Self-pay

## 2023-01-03 ENCOUNTER — Inpatient Hospital Stay: Payer: 59

## 2023-01-03 ENCOUNTER — Other Ambulatory Visit: Payer: Self-pay | Admitting: Internal Medicine

## 2023-01-03 ENCOUNTER — Inpatient Hospital Stay (HOSPITAL_BASED_OUTPATIENT_CLINIC_OR_DEPARTMENT_OTHER): Payer: 59 | Admitting: Internal Medicine

## 2023-01-03 ENCOUNTER — Other Ambulatory Visit: Payer: Self-pay

## 2023-01-03 ENCOUNTER — Encounter: Payer: Self-pay | Admitting: Internal Medicine

## 2023-01-03 VITALS — BP 127/63 | HR 54

## 2023-01-03 DIAGNOSIS — C9000 Multiple myeloma not having achieved remission: Secondary | ICD-10-CM | POA: Diagnosis not present

## 2023-01-03 DIAGNOSIS — Z5112 Encounter for antineoplastic immunotherapy: Secondary | ICD-10-CM | POA: Diagnosis not present

## 2023-01-03 LAB — CBC WITH DIFFERENTIAL (CANCER CENTER ONLY)
Abs Immature Granulocytes: 0.01 10*3/uL (ref 0.00–0.07)
Basophils Absolute: 0.1 10*3/uL (ref 0.0–0.1)
Basophils Relative: 1 %
Eosinophils Absolute: 0.2 10*3/uL (ref 0.0–0.5)
Eosinophils Relative: 3 %
HCT: 29.7 % — ABNORMAL LOW (ref 36.0–46.0)
Hemoglobin: 9.5 g/dL — ABNORMAL LOW (ref 12.0–15.0)
Immature Granulocytes: 0 %
Lymphocytes Relative: 50 %
Lymphs Abs: 3.2 10*3/uL (ref 0.7–4.0)
MCH: 30.5 pg (ref 26.0–34.0)
MCHC: 32 g/dL (ref 30.0–36.0)
MCV: 95.5 fL (ref 80.0–100.0)
Monocytes Absolute: 0.3 10*3/uL (ref 0.1–1.0)
Monocytes Relative: 4 %
Neutro Abs: 2.7 10*3/uL (ref 1.7–7.7)
Neutrophils Relative %: 42 %
Platelet Count: 172 10*3/uL (ref 150–400)
RBC: 3.11 MIL/uL — ABNORMAL LOW (ref 3.87–5.11)
RDW: 16 % — ABNORMAL HIGH (ref 11.5–15.5)
WBC Count: 6.5 10*3/uL (ref 4.0–10.5)
nRBC: 0 % (ref 0.0–0.2)

## 2023-01-03 LAB — CMP (CANCER CENTER ONLY)
ALT: 9 U/L (ref 0–44)
AST: 13 U/L — ABNORMAL LOW (ref 15–41)
Albumin: 3.5 g/dL (ref 3.5–5.0)
Alkaline Phosphatase: 59 U/L (ref 38–126)
Anion gap: 8 (ref 5–15)
BUN: 12 mg/dL (ref 8–23)
CO2: 22 mmol/L (ref 22–32)
Calcium: 8.1 mg/dL — ABNORMAL LOW (ref 8.9–10.3)
Chloride: 109 mmol/L (ref 98–111)
Creatinine: 0.86 mg/dL (ref 0.44–1.00)
GFR, Estimated: >60 mL/min (ref >60)
Glucose, Bld: 86 mg/dL (ref 70–99)
Potassium: 3.6 mmol/L (ref 3.5–5.1)
Sodium: 139 mmol/L (ref 135–145)
Total Bilirubin: 0.5 mg/dL (ref 0.3–1.2)
Total Protein: 7.5 g/dL (ref 6.5–8.1)

## 2023-01-03 LAB — MULTIPLE MYELOMA PANEL, SERUM
Albumin SerPl Elph-Mcnc: 3.3 g/dL (ref 2.9–4.4)
Albumin/Glob SerPl: 0.8 (ref 0.7–1.7)
Alpha 1: 0.1 g/dL (ref 0.0–0.4)
Alpha2 Glob SerPl Elph-Mcnc: 0.7 g/dL (ref 0.4–1.0)
B-Globulin SerPl Elph-Mcnc: 1.5 g/dL — ABNORMAL HIGH (ref 0.7–1.3)
Gamma Glob SerPl Elph-Mcnc: 2 g/dL — ABNORMAL HIGH (ref 0.4–1.8)
Globulin, Total: 4.4 g/dL — ABNORMAL HIGH (ref 2.2–3.9)
IgA: 1433 mg/dL — ABNORMAL HIGH (ref 87–352)
IgG (Immunoglobin G), Serum: 613 mg/dL (ref 586–1602)
IgM (Immunoglobulin M), Srm: 7 mg/dL — ABNORMAL LOW (ref 26–217)
M Protein SerPl Elph-Mcnc: 1.5 g/dL — ABNORMAL HIGH
Total Protein ELP: 7.7 g/dL (ref 6.0–8.5)

## 2023-01-03 LAB — TSH: TSH: 33.684 u[IU]/mL — ABNORMAL HIGH (ref 0.350–4.500)

## 2023-01-03 MED ORDER — SODIUM CHLORIDE 0.9 % IV SOLN
10.0000 mg/kg | Freq: Once | INTRAVENOUS | Status: AC
  Administered 2023-01-03: 800 mg via INTRAVENOUS
  Filled 2023-01-03: qty 25

## 2023-01-03 MED ORDER — SODIUM CHLORIDE 0.9 % IV SOLN
20.0000 mg | Freq: Once | INTRAVENOUS | Status: AC
  Administered 2023-01-03: 20 mg via INTRAVENOUS
  Filled 2023-01-03: qty 20

## 2023-01-03 MED ORDER — HEPARIN SOD (PORK) LOCK FLUSH 100 UNIT/ML IV SOLN
500.0000 [IU] | Freq: Once | INTRAVENOUS | Status: AC | PRN
  Administered 2023-01-03: 500 [IU]
  Filled 2023-01-03: qty 5

## 2023-01-03 MED ORDER — SODIUM CHLORIDE 0.9 % IV SOLN
Freq: Once | INTRAVENOUS | Status: AC
  Filled 2023-01-03: qty 250

## 2023-01-03 MED ORDER — ACETAMINOPHEN 325 MG PO TABS
650.0000 mg | ORAL_TABLET | Freq: Once | ORAL | Status: AC
  Administered 2023-01-03: 650 mg via ORAL
  Filled 2023-01-03: qty 2

## 2023-01-03 MED ORDER — DEXTROSE 5 % IV SOLN
56.0000 mg/m2 | Freq: Once | INTRAVENOUS | Status: AC
  Administered 2023-01-03: 100 mg via INTRAVENOUS
  Filled 2023-01-03: qty 50

## 2023-01-03 MED ORDER — FAMOTIDINE IN NACL 20-0.9 MG/50ML-% IV SOLN
20.0000 mg | Freq: Once | INTRAVENOUS | Status: AC
  Administered 2023-01-03: 20 mg via INTRAVENOUS
  Filled 2023-01-03: qty 50

## 2023-01-03 MED ORDER — MONTELUKAST SODIUM 10 MG PO TABS
10.0000 mg | ORAL_TABLET | Freq: Once | ORAL | Status: AC
  Administered 2023-01-03: 10 mg via ORAL
  Filled 2023-01-03: qty 1

## 2023-01-03 MED ORDER — DIPHENHYDRAMINE HCL 50 MG/ML IJ SOLN
50.0000 mg | Freq: Once | INTRAMUSCULAR | Status: AC
  Administered 2023-01-03: 50 mg via INTRAVENOUS
  Filled 2023-01-03: qty 1

## 2023-01-03 MED FILL — Dexamethasone Sodium Phosphate Inj 100 MG/10ML: INTRAMUSCULAR | Qty: 2 | Status: AC

## 2023-01-03 NOTE — Addendum Note (Signed)
Addended by: Lesle Chris on: 01/03/2023 09:19 AM   Modules accepted: Orders

## 2023-01-03 NOTE — Assessment & Plan Note (Signed)
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  Most recently progressed on salvage therapy with daratumumab  lenalidomide [15 mg 3 w; 1-w] dexamethasone. plasma cell myeloma by approximately 70% of the overall cellular marrow;  FEB 2024-lambda light chain 284 Bone marrow-70% plasma cells. M Protein 0.9-rising lambda light chain ratio. Discontinue Revlimid; and dara-given the progressive disease.  MARCH 2024- PET scan: No skeletal lesions noted. APRIl 2024- MRI-cervical/thoracic/lumbar spine-negative for any lesions in the spinal cord. DISCONTINUEPomalyst;- carfilzomib-dexamethasone- [rising M-protein; MAY 2024- 1.3. ] start 12/27/2022-Isa-Carfil-Dex [[facility]]  #  12/27/2022-Isa-Dex; d-2carfil weekly weeklyx4q 28 days  [[facility]] [facility] 2024- Left ventricular ejection fraction, by estimation, is 50 to 55%.   # Frequent sinus/acute bronchitis infection-status post antibiotics improved. Septic arthritis-[April 2024-; Dr.Bowers] -IgG levels less than 400.s/p with IVIG infusions 400 mg/kg x2 - no infections- stable.  # Hypokalemia: mild- recommend Kdur TID; hypocalcemia- 8.0- vit D march 2024- 50- recommend ca+vit D BID.   # Left knee septic arthritis/immunocompromised host- [s/p Dr.Bowers; ID]; currently-S/p evaluation with ID-s/p antibioics- continue tramadol. Stable.  # Iatrogenic hypothyroidism [ Graves/goiter s/p total thyroidectomy;aug,2022 ]-currently on Synthroid 100 mcg/day-September 2023 TSH normal. Stable.  # Lower back discomfort x 6 months-see above.  MRI lumbar spine as above.  Continue tramadol.  Stable.  # Bone lesions /last zometa on 11/27/2017.    Dental extraction appointment appt-on Jan 26th.  PET scan evidence of any bone lesions.-Hold Zometa for now.   Stable.  # IV access: mediport- functional  # Vaccination: Flu shot s/p;  s/p Pneumonia vaccination [march 2024].   7 w- given dental extrac- on  1/26. :MM panel; K/l light chain ratio q 4 W D-isa-dex;car1&2-car; weekly x 4 q 28 day cycle  # DISPOSITION:  # ADD TSH to labs today-  # as per IS-  chemo  today # As per IS- MD; labs; infusion appt- Dr.B

## 2023-01-03 NOTE — Progress Notes (Signed)
[facility] OFFICE PROGRESS NOTE  Patient Care Team: Leanna Sato, MD as PCP - General (Family Medicine) End, Cristal Deer, MD as PCP - Cardiology (Cardiology) Eddie Candle, [name] Earna Coder, MD as Consulting Physician (Hematology and Oncology) Vida Rigger, MD as Consulting Physician (Pulmonary Disease) Tedd Sias Marlana Salvage, MD as Consulting Physician (Endocrinology)   Cancer Staging  No matching staging information was found for the patient.  Oncology History Overview Note  # SEP 2018- MULTIPLE MYELOMA IgALamda [2.5 gm/dl; K/L= 82/9562]; STAGE III [beta 2 microglobulin=5.5] [presented with acute renal failure; anemia; NO hypercalcemia; Skeletal survey-Normal]; BMBx- 45% plasma cells; FISH-POSITIVE 11:14 translocation.[STANDARD-high RISK]/cyto-Normal; SEP 2018- PET- L3 posterior element lesion.   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 No morphologic support for residual myeloma disease. Negative for minimal residual disease by MM-MRD flow cytometry; HOLD REVLIMID [leg swelling]; AUG 2021-PET scan negative for myeloma; continue to hold Revlimid  # MARCH 2021-diastolic congestive heart failure [Dr.End]  # BMBx- OCT 2021- BMBx- 5% plasmacytosis-however this appears to be more polyclonal rather than monoclonal-not explain patient worsening anemia.   # OCT 2022- 18th- Dara- Rev-Dex; MARCH, 2024- PROGRESSION bone marrow biopsy  70% plasma cells; PET scan no bone lesions-however conus medullaris uptake-MRI lumbar spine-   # MAY 2024- DISCONTINUE carfilzomib-dexamethasone- Pomalyst; [rising M-protein; MAY 2024- 1.3. ]   # start 12/27/2022 -Isa-Carfil-Dex   #November 2021-hyperthyroidism/goiter- [D.Bennett/Dr.Solum]-methimazole. S/p Thyroidectomy [Dr.Kim; [facility]-AUG 2022]  --------------------------  # 12/12- RIGHT JUGULAR DVT-x 40m on xarelto; finished April 2020; September 2020-EGD/dysphagia; Dr. Tobi Bastos  # Acute renal failure [Dr.Singh; Proteinuria 1.5gm/day ]; acyclovir/Asprin ------------------------------------------------------------------------------------------------------------   DIAGNOSIS: [ ]  MULTIPLE MYELOMA  STAGE: III/HIGH RISK ;GOALS: CONTROL      Multiple myeloma not having achieved remission (HCC)  11/21/2017 - 04/28/2018 Chemotherapy   Patient is on Treatment Plan : MYELOMA SALVAGE Cyclophosphamide / Carfilzomib / Dexamethasone (CCd) q28d     05/12/2021 - 02/25/2022 Chemotherapy   Patient is on Treatment Plan : MYELOMA RELAPSED REFRACTORY Daratumumab SQ + Lenalidomide + Dexamethasone (DaraRd) q28d     05/12/2021 - 07/23/2022 Chemotherapy   Patient is on Treatment Plan : MYELOMA RELAPSED REFRACTORY Daratumumab SQ + Lenalidomide + Dexamethasone (DaraRd) q28d     11/02/2022 - 11/16/2022 Chemotherapy   Patient is on Treatment Plan : MYELOMA RELAPSED/ REFRACTORY Carfilzomib D1,8,15 (20/27) + Pomalidomide + Dexamethasone (KPd) q28d     12/27/2022 -  Chemotherapy   Patient is on Treatment Plan : MYELOMA Carfilzomib + Dexamethasone + Isatuximab q28d      INTERVAL HISTORY: Ambulating  independently.   Alone.  Brandi [name] 72 y.o.  female pleasant patient above history of relapsed multiple myeloma--currently  Brandi [name]- Carfil-Dex is here for a follow up.    No infections. Denies any worsening pain.  Patient taking tramadol as needed for leg cramps.    Review of Systems  Constitutional:  Positive for  malaise/fatigue. Negative for chills, diaphoresis and fever.  HENT:  Negative for nosebleeds and sore throat.   Eyes:  Negative for double vision.  Respiratory:  Negative for hemoptysis.   Cardiovascular:  Negative for chest pain, palpitations and orthopnea.  Gastrointestinal:  Negative for abdominal pain, blood in stool, constipation, heartburn and melena.  Genitourinary:  Negative for dysuria, frequency and urgency.  Musculoskeletal:  Positive for back pain.  Skin: Negative.  Negative for itching and rash.  Neurological:  Negative for dizziness, tingling, focal weakness, weakness and headaches.  Endo/Heme/Allergies:  Does not bruise/bleed easily.  Psychiatric/Behavioral:  Negative for depression. The patient is not nervous/anxious.     PAST MEDICAL HISTORY :  Past Medical History:  Diagnosis Date   (HFpEF) heart failure with preserved ejection [name] (HCC)    a. 06/2020 Echo: EF 60-65%, no rwma, nl RV size/fxn. Triv MR. Triv TR/AI. Mod elev PASP.   Anxiety    COPD (chronic obstructive pulmonary disease) (HCC)    GERD (gastroesophageal reflux disease)    Hypertension    Hyperthyroidism    Hypokalemia    IgA myeloma (HCC)    Multiple myeloma (HCC)    Pneumonia    Red blood cell antibody positive, compatible PRBC difficult to obtain    S/P autologous bone marrow transplantation (HCC)     PAST SURGICAL HISTORY :   Past Surgical History:  Procedure Laterality Date   ANTERIOR VITRECTOMY Left 10/07/2021   Procedure: ANTERIOR VITRECTOMY;  Surgeon: Lockie Mola, MD;  Location: [facility];  Service: Ophthalmology;  Laterality: Left;   CATARACT EXTRACTION W/PHACO Left 10/07/2021   Procedure: CATARACT EXTRACTION PHACO AND INTRAOCULAR LENS PLACEMENT (IOC) LEFT VISION BLUE;  Surgeon: Lockie Mola, MD;  Location: [facility];  Service: Ophthalmology;  Laterality: Left;  kit for manual incision 14.56 01:21.4   CATARACT EXTRACTION W/PHACO Right 10/21/2021    Procedure: CATARACT EXTRACTION PHACO AND INTRAOCULAR LENS PLACEMENT (IOC) RIGHT 11.12 01:48.1;  Surgeon: Lockie Mola, MD;  Location: [facility];  Service: Ophthalmology;  Laterality: Right;   ESOPHAGOGASTRODUODENOSCOPY (EGD) WITH PROPOFOL N/A 03/30/2019   Procedure: ESOPHAGOGASTRODUODENOSCOPY (EGD) WITH PROPOFOL;  Surgeon: Wyline Mood, MD;  Location: [facility];  Service: Gastroenterology;  Laterality: N/A;   IR FLUORO GUIDE PORT INSERTION RIGHT  12/16/2017   IR IMAGING GUIDED PORT INSERTION  06/15/2021   KNEE ARTHROSCOPY Left 09/03/2022   Procedure: ARTHROSCOPY KNEE DEBRIDEMENT;  Surgeon: Lyndle Herrlich, MD;  Location: [facility] ORS;  Service: Orthopedics;  Laterality: Left;   THYROIDECTOMY N/A     FAMILY HISTORY :   Family History  Problem Relation Age of Onset   Pneumonia Mother    Seizures Father     SOCIAL HISTORY:   Social History   Tobacco Use   Smoking status: Former    Packs/day: .25    Types: Cigarettes    Quit date: 03/18/2021    Years since quitting: 1.7   Smokeless tobacco: Never   Tobacco comments:    2 cigarettes QOD  Vaping Use   Vaping Use: Never used  Substance Use Topics   Alcohol use: No   Drug use: No    ALLERGIES:  has No Known Allergies.  MEDICATIONS:  Current Outpatient Medications  Medication Sig Dispense Refill   acyclovir (ZOVIRAX) 400 MG tablet Take 1 tablet by mouth twice daily 60 tablet 0   albuterol (VENTOLIN HFA) 108 (90 Base) MCG/ACT inhaler Inhale 2 puffs into the lungs every 6 (six) hours as needed for wheezing or shortness of breath. 8 g 3   aspirin EC 81 MG EC tablet Take 1 tablet (81 mg total) by mouth daily. 90 tablet 3  benzonatate (TESSALON) 100 MG capsule TAKE 1 CAPSULE BY MOUTH TWICE DAILY AS NEEDED FOR COUGH 40 capsule 0   calcium-vitamin D (OSCAL WITH D) 500MG -200UNIT ( ) tablet Take 1 tablet by mouth 2 (two) times daily. 60 tablet 0   carvedilol (COREG) 3.125 MG tablet Take 1 tablet (3.125 mg total) by  mouth 2 (two) times daily with a meal. 60 tablet 5   furosemide (LASIX) 40 MG tablet Take 1 tablet (40 mg total) by mouth daily. 90 tablet 3   levothyroxine (SYNTHROID) 100 MCG tablet TAKE 1 TABLET BY MOUTH BEFORE BREAKFAST 90 tablet 0   lidocaine-prilocaine (EMLA) cream Apply 1 application topically as needed. Apply to port and cover with saran wrap 1-2 hours prior to port access 30 g 1   magnesium oxide (MAG-OX) 400 (240 Mg) MG tablet Take 1 tablet (400 mg total) by mouth 2 (two) times daily. 60 tablet 3   methylPREDNISolone (MEDROL DOSEPAK) 4 MG TBPK tablet Use as directed. 21 tablet 1   montelukast (SINGULAIR) 10 MG tablet Take 1 tablet (10 mg total) by mouth at bedtime. 30 tablet 3   ondansetron (ZOFRAN) 8 MG tablet One pill every 8 hours as needed for nausea/vomitting. 40 tablet 1   polyethylene glycol (MIRALAX / GLYCOLAX) packet Take 17 g by mouth daily as needed for mild constipation. 14 each 0   pomalidomide (POMALYST) 4 MG capsule Take 1 capsule (4 mg total) by mouth daily. Take for 21 days, then old for 7 days. Repeat every 28 days. 21 capsule 0   potassium chloride SA (KLOR-CON M) 20 MEQ tablet Take 1 tablet (20 mEq total) by mouth 2 (two) times daily. 60 tablet 6   spironolactone (ALDACTONE) 25 MG tablet Take 1 tablet by mouth once daily 90 tablet 3   traMADol (ULTRAM) 50 MG tablet Take 1 tablet (50 mg total) by mouth every 12 (twelve) hours as needed. 60 tablet 0   Vitamin D, Ergocalciferol, (DRISDOL) 1.25 MG (50000 UNIT) CAPS capsule Take 1 capsule (50,000 Units total) by mouth once a week. 12 capsule 0   pantoprazole (PROTONIX) 40 MG tablet Take 1 tablet (40 mg total) by mouth daily. 90 tablet 0   No current facility-administered medications for this visit.   Facility-Administered Medications Ordered in Other Visits  Medication Dose Route Frequency Provider Last Rate Last Admin   0.9 %  sodium chloride infusion   Intravenous Continuous Ralene Muskrat, PA-C       0.9 %  sodium  chloride infusion   Intravenous Once Louretta Shorten R, MD       0.9 %  sodium chloride infusion   Intravenous Once Earna Coder, MD       acetaminophen (TYLENOL) tablet 650 mg  650 mg Oral Once Earna Coder, MD       carfilzomib (KYPROLIS) 100 mg in dextrose 5 % 100 mL chemo infusion  56 mg/m2 (Treatment Plan Recorded) Intravenous Once Earna Coder, MD       dexamethasone (DECADRON) 20 mg in sodium chloride 0.9 % 50 mL IVPB  20 mg Intravenous Once Earna Coder, MD       diphenhydrAMINE (BENADRYL) injection 50 mg  50 mg Intravenous Once Earna Coder, MD       famotidine (PEPCID) IVPB 20 mg premix  20 mg Intravenous Once Earna Coder, MD       isatuximab-irfc (SARCLISA) 800 mg in sodium chloride 0.9 % 210 mL (3.2 mg/mL) chemo infusion  10 mg/kg (  Treatment Plan Recorded) Intravenous Once Earna Coder, MD       montelukast (SINGULAIR) tablet 10 mg  10 mg Oral Once Earna Coder, MD        PHYSICAL EXAMINATION:   BP 128/85 (BP Location: Left Arm, Patient Position: Sitting, Cuff Size: Large)   Pulse 74   Temp 98.4 F (36.9 C) (Tympanic)   Ht 5\' 2"  (1.575 m)   Wt 171 lb 3.2 oz (77.7 kg)   SpO2 98%   BMI 31.31 kg/m   Filed Weights   01/03/23 0805  Weight: 171 lb 3.2 oz (77.7 kg)   Bilateral coarse breath sounds.   Physical Exam HENT:     Head: Normocephalic and atraumatic.  Eyes:     Pupils: Pupils are equal, round, and reactive to light.  Cardiovascular:     Rate and Rhythm: Normal rate and regular rhythm.  Pulmonary:     Effort: Pulmonary effort is normal. No respiratory distress.     Breath sounds: Normal breath sounds. No wheezing.  Abdominal:     General: Bowel sounds are normal. There is no distension.     Palpations: Abdomen is soft. There is no mass.     Tenderness: There is no abdominal tenderness. There is no guarding or rebound.  Musculoskeletal:        General: No tenderness. Normal range of  motion.     Cervical back: Normal range of motion and neck supple.  Skin:    General: Skin is warm.  Neurological:     Mental Status: She is alert and oriented to person, place, and time.  Psychiatric:        Mood and Affect: Affect normal.    LABORATORY DATA:  I have reviewed the data as listed    Component Value Date/Time   NA 139 01/03/2023 0803   NA 141 01/25/2020 1530   K 3.6 01/03/2023 0803   CL 109 01/03/2023 0803   CO2 22 01/03/2023 0803   GLUCOSE 86 01/03/2023 0803   BUN 12 01/03/2023 0803   BUN 19 01/25/2020 1530   CREATININE 0.86 01/03/2023 0803   CALCIUM 8.1 (L) 01/03/2023 0803   PROT 7.5 01/03/2023 0803   ALBUMIN 3.5 01/03/2023 0803   AST 13 (L) 01/03/2023 0803   ALT 9 01/03/2023 0803   ALKPHOS 59 01/03/2023 0803   BILITOT 0.5 01/03/2023 0803   GFRNONAA >60 01/03/2023 0803   GFRAA NOT CALCULATED 04/17/2020 1352    No results found for: "SPEP", "UPEP"  Lab Results  Component Value Date   WBC 6.5 01/03/2023   NEUTROABS 2.7 01/03/2023   HGB 9.5 (L) 01/03/2023   HCT 29.7 (L) 01/03/2023   MCV 95.5 01/03/2023   PLT 172 01/03/2023      Chemistry      Component Value Date/Time   NA 139 01/03/2023 0803   NA 141 01/25/2020 1530   K 3.6 01/03/2023 0803   CL 109 01/03/2023 0803   CO2 22 01/03/2023 0803   BUN 12 01/03/2023 0803   BUN 19 01/25/2020 1530   CREATININE 0.86 01/03/2023 0803      Component Value Date/Time   CALCIUM 8.1 (L) 01/03/2023 0803   ALKPHOS 59 01/03/2023 0803   AST 13 (L) 01/03/2023 0803   ALT 9 01/03/2023 0803   BILITOT 0.5 01/03/2023 0803      (H): Data is abnormally high   Latest Reference Range & Units Most Recent 12/24/20 14:01 04/01/21 14:34 04/28/21 09:17 06/11/21 09:42 07/03/21  10:32 08/20/21 10:06  M Protein SerPl Elph-Mcnc Not Observed g/dL 1.1 (H) (C) 08/13/12 78:29 Not Observed (C) 0.6 (H) (C) 0.7 (H) (C) 0.5 (H) (C) 0.7 (H) (C) 1.1 (H) (C)    Latest Reference Range & Units Most Recent 09/17/21 08:47 10/15/21 08:25  11/12/21 08:55  Kappa free light chain 3.3 - 19.4 mg/L 5.0 11/12/21 08:55 7.2 7.2 5.0  Lambda free light chains 5.7 - 26.3 mg/L 223.6 (H) 11/12/21 08:55 215.3 (H) 340.2 (H) 223.6 (H)  Kappa, lambda light chain ratio 0.26 - 1.65  0.02 (L) 11/12/21 08:55 0.03 (L) 0.02 (L) 0.02 (L)  (H): Data is abnormally high (L): Data is abnormally low  RADIOGRAPHIC STUDIES: I have personally reviewed the radiological images as listed and agreed with the findings in the report. No results found.   ASSESSMENT & PLAN:  Multiple myeloma not having achieved remission (HCC) # RECURRENT Multiple myeloma stage III [high-risk cytogenetics-status post KRD-VGPR ; status post autologous stem cell transplant on 06/15/18.  Most recently progressed on salvage therapy with daratumumab  lenalidomide [15 mg 3 w; 1-w] dexamethasone. plasma cell myeloma by approximately 70% of the overall cellular marrow;  FEB 2024-lambda light chain 284 Bone marrow-70% plasma cells. M Protein 0.9-rising lambda light chain ratio. Discontinue Revlimid; and dara-given the progressive disease.  MARCH 2024- PET scan: No skeletal lesions noted. APRIl 2024- MRI-cervical/thoracic/lumbar spine-negative for any lesions in the spinal cord. DISCONTINUEPomalyst;- carfilzomib-dexamethasone- [rising M-protein; MAY 2024- 1.3. ] start 12/27/2022-Isa-Carfil-Dex [[facility]]  #  12/27/2022-Isa-Dex; d-2carfil weekly weeklyx4q 28 days  [[facility]] [facility] 2024- Left ventricular ejection [name], by estimation, is 50 to 55%.   # Frequent sinus/acute bronchitis infection-status post antibiotics improved. Septic arthritis-[April 2024-; Dr.Bowers] -IgG levels less than 400.s/p with IVIG infusions 400 mg/kg x2 - no infections- stable.  # Hypokalemia: mild- recommend Kdur TID; hypocalcemia- 8.0- vit D march 2024- 50- recommend ca+vit D BID.   # Left knee septic arthritis/immunocompromised host- [s/p Dr.Bowers; ID]; currently-S/p evaluation with ID-s/p antibioics- continue tramadol.  Stable.  # Iatrogenic hypothyroidism [ Graves/goiter s/p total thyroidectomy;aug,2022 ]-currently on Synthroid 100 mcg/day-September 2023 TSH normal. Stable.  # Lower back discomfort x 6 months-see above.  MRI lumbar spine as above.  Continue tramadol.  Stable.  # Bone lesions /last zometa on 11/27/2017.    Dental extraction appointment appt-on Jan 26th.  PET scan evidence of any bone lesions.-Hold Zometa for now.   Stable.  # IV access: mediport- functional  # Vaccination: Flu shot s/p;  s/p Pneumonia vaccination [march 2024].   7 w- given dental extrac- on 1/26. :MM panel; K/l light chain ratio q 4 W D-isa-dex;car1&2-car; weekly x 4 q 28 day cycle  # DISPOSITION:  # ADD TSH to labs today-  # as per IS-  chemo  today # As per IS- MD; labs; infusion appt- Dr.B   No orders of the defined types were placed in this encounter.  All questions were answered. The patient knows to call the clinic with any problems, questions or concerns.      Earna Coder, MD 01/03/2023 9:17 AM

## 2023-01-03 NOTE — Patient Instructions (Signed)
[facility] [place] [place] [facility]  Discharge Instructions: Thank you for choosing [facility] to provide your oncology and hematology care.  If you have a lab appointment with the Cancer Center, please go directly to the Cancer Center and check in at the registration area.  Wear comfortable clothing and clothing appropriate for easy access to any Portacath or PICC line.   We strive to give you quality time with your provider. You may need to reschedule your appointment if you arrive late (15 or more minutes).  Arriving late affects you and other patients whose appointments are after yours.  Also, if you miss three or more appointments without notifying the office, you may be dismissed from the clinic at the provider's discretion.      For prescription refill requests, have your pharmacy contact our office and allow 72 hours for refills to be completed.     To help prevent nausea and vomiting after your treatment, we encourage you to take your nausea medication as directed.  BELOW ARE SYMPTOMS THAT SHOULD BE REPORTED IMMEDIATELY: *FEVER GREATER THAN 100.4 F (38 C) OR HIGHER *CHILLS OR SWEATING *NAUSEA AND VOMITING THAT IS NOT CONTROLLED WITH YOUR NAUSEA MEDICATION *UNUSUAL SHORTNESS OF BREATH *UNUSUAL BRUISING OR BLEEDING *URINARY PROBLEMS (pain or burning when urinating, or frequent urination) *BOWEL PROBLEMS (unusual diarrhea, constipation, pain near the anus) TENDERNESS IN MOUTH AND THROAT WITH OR WITHOUT PRESENCE OF ULCERS (sore throat, sores in mouth, or a toothache) UNUSUAL RASH, SWELLING OR PAIN  UNUSUAL VAGINAL DISCHARGE OR ITCHING   Items with * indicate a potential emergency and should be followed up as soon as possible or go to the Emergency Department if any problems should occur.  Please show the CHEMOTHERAPY ALERT CARD or IMMUNOTHERAPY ALERT CARD at check-in to the Emergency Department and triage nurse.  Should you have questions after your visit  or need to cancel or reschedule your appointment, please contact [facility] [place] [facility]  [phone] and follow the prompts.  Office hours are 8:00 a.m. to 4:30 p.m. Monday - Friday. Please note that voicemails left after 4:00 p.m. may not be returned until the following business day.  We are closed weekends and major holidays. You have access to a nurse at all times for urgent questions. Please call the main number to the clinic [phone] and follow the prompts.  For any non-urgent questions, you may also contact your provider using MyChart. We now offer e-Visits for anyone 18 and older to request care online for non-urgent symptoms. For details visit [email or web address].   Also download the MyChart app! Go to the app store, search "MyChart", open the app, select [facility], and log in with your MyChart username and password.    

## 2023-01-03 NOTE — Progress Notes (Signed)
No concerns today 

## 2023-01-04 ENCOUNTER — Inpatient Hospital Stay: Payer: 59

## 2023-01-04 ENCOUNTER — Inpatient Hospital Stay: Payer: 59 | Admitting: Internal Medicine

## 2023-01-04 VITALS — BP 134/58 | HR 60 | Temp 97.9°F | Resp 16

## 2023-01-04 DIAGNOSIS — C9000 Multiple myeloma not having achieved remission: Secondary | ICD-10-CM

## 2023-01-04 DIAGNOSIS — Z5112 Encounter for antineoplastic immunotherapy: Secondary | ICD-10-CM | POA: Diagnosis not present

## 2023-01-04 MED ORDER — SODIUM CHLORIDE 0.9 % IV SOLN
20.0000 mg | Freq: Once | INTRAVENOUS | Status: AC
  Administered 2023-01-04: 20 mg via INTRAVENOUS
  Filled 2023-01-04: qty 2

## 2023-01-04 MED ORDER — SODIUM CHLORIDE 0.9 % IV SOLN
Freq: Once | INTRAVENOUS | Status: AC
  Filled 2023-01-04: qty 250

## 2023-01-04 MED ORDER — DEXTROSE 5 % IV SOLN
56.0000 mg/m2 | Freq: Once | INTRAVENOUS | Status: AC
  Administered 2023-01-04: 100 mg via INTRAVENOUS
  Filled 2023-01-04: qty 30

## 2023-01-04 NOTE — Patient Instructions (Signed)
[facility] [place] [facility]  Discharge Instructions: Thank you for choosing [facility] to provide your oncology and hematology care.  If you have a lab appointment with the Cancer Center, please go directly to the Cancer Center and check in at the registration area.  Wear comfortable clothing and clothing appropriate for easy access to any Portacath or PICC line.   We strive to give you quality time with your provider. You may need to reschedule your appointment if you arrive late (15 or more minutes).  Arriving late affects you and other patients whose appointments are after yours.  Also, if you miss three or more appointments without notifying the office, you may be dismissed from the clinic at the provider's discretion.      For prescription refill requests, have your pharmacy contact our office and allow 72 hours for refills to be completed.    Today you received the following chemotherapy and/or immunotherapy agents Kyprolis      To help prevent nausea and vomiting after your treatment, we encourage you to take your nausea medication as directed.  BELOW ARE SYMPTOMS THAT SHOULD BE REPORTED IMMEDIATELY: *FEVER GREATER THAN 100.4 F (38 C) OR HIGHER *CHILLS OR SWEATING *NAUSEA AND VOMITING THAT IS NOT CONTROLLED WITH YOUR NAUSEA MEDICATION *UNUSUAL SHORTNESS OF BREATH *UNUSUAL BRUISING OR BLEEDING *URINARY PROBLEMS (pain or burning when urinating, or frequent urination) *BOWEL PROBLEMS (unusual diarrhea, constipation, pain near the anus) TENDERNESS IN MOUTH AND THROAT WITH OR WITHOUT PRESENCE OF ULCERS (sore throat, sores in mouth, or a toothache) UNUSUAL RASH, SWELLING OR PAIN  UNUSUAL VAGINAL DISCHARGE OR ITCHING   Items with * indicate a potential emergency and should be followed up as soon as possible or go to the Emergency Department if any problems should occur.  Please show the CHEMOTHERAPY ALERT CARD or IMMUNOTHERAPY ALERT CARD at check-in to  the Emergency Department and triage nurse.  Should you have questions after your visit or need to cancel or reschedule your appointment, please contact [facility] [place] [facility]  [phone] and follow the prompts.  Office hours are 8:00 a.m. to 4:30 p.m. Monday - Friday. Please note that voicemails left after 4:00 p.m. may not be returned until the following business day.  We are closed weekends and major holidays. You have access to a nurse at all times for urgent questions. Please call the main number to the clinic [phone] and follow the prompts.  For any non-urgent questions, you may also contact your provider using MyChart. We now offer e-Visits for anyone 18 and older to request care online for non-urgent symptoms. For details visit [email or web address].   Also download the MyChart app! Go to the app store, search "MyChart", open the app, select , and log in with your MyChart username and password.    

## 2023-01-07 MED FILL — Dexamethasone Sodium Phosphate Inj 100 MG/10ML: INTRAMUSCULAR | Qty: 2 | Status: AC

## 2023-01-10 ENCOUNTER — Encounter: Payer: Self-pay | Admitting: Internal Medicine

## 2023-01-10 ENCOUNTER — Ambulatory Visit: Payer: 59 | Admitting: Internal Medicine

## 2023-01-10 ENCOUNTER — Inpatient Hospital Stay: Payer: 59

## 2023-01-10 ENCOUNTER — Ambulatory Visit: Payer: 59

## 2023-01-10 ENCOUNTER — Inpatient Hospital Stay (HOSPITAL_BASED_OUTPATIENT_CLINIC_OR_DEPARTMENT_OTHER): Payer: 59 | Admitting: Internal Medicine

## 2023-01-10 ENCOUNTER — Other Ambulatory Visit: Payer: 59

## 2023-01-10 VITALS — BP 125/68 | HR 69 | Temp 98.5°F | Ht 62.0 in | Wt 172.2 lb

## 2023-01-10 DIAGNOSIS — C9000 Multiple myeloma not having achieved remission: Secondary | ICD-10-CM

## 2023-01-10 DIAGNOSIS — Z5112 Encounter for antineoplastic immunotherapy: Secondary | ICD-10-CM | POA: Diagnosis not present

## 2023-01-10 LAB — CMP (CANCER CENTER ONLY)
ALT: 10 U/L (ref 0–44)
AST: 13 U/L — ABNORMAL LOW (ref 15–41)
Albumin: 3.6 g/dL (ref 3.5–5.0)
Alkaline Phosphatase: 54 U/L (ref 38–126)
Anion gap: 9 (ref 5–15)
BUN: 14 mg/dL (ref 8–23)
CO2: 22 mmol/L (ref 22–32)
Calcium: 8.2 mg/dL — ABNORMAL LOW (ref 8.9–10.3)
Chloride: 108 mmol/L (ref 98–111)
Creatinine: 0.79 mg/dL (ref 0.44–1.00)
GFR, Estimated: >60 mL/min (ref >60)
Glucose, Bld: 88 mg/dL (ref 70–99)
Potassium: 3.5 mmol/L (ref 3.5–5.1)
Sodium: 139 mmol/L (ref 135–145)
Total Bilirubin: 0.7 mg/dL (ref 0.3–1.2)
Total Protein: 6.8 g/dL (ref 6.5–8.1)

## 2023-01-10 LAB — CBC WITH DIFFERENTIAL (CANCER CENTER ONLY)
Abs Immature Granulocytes: 0.01 10*3/uL (ref 0.00–0.07)
Basophils Absolute: 0 10*3/uL (ref 0.0–0.1)
Basophils Relative: 0 %
Eosinophils Absolute: 0.2 10*3/uL (ref 0.0–0.5)
Eosinophils Relative: 5 %
HCT: 28.8 % — ABNORMAL LOW (ref 36.0–46.0)
Hemoglobin: 9.2 g/dL — ABNORMAL LOW (ref 12.0–15.0)
Immature Granulocytes: 0 %
Lymphocytes Relative: 33 %
Lymphs Abs: 1.2 10*3/uL (ref 0.7–4.0)
MCH: 30.3 pg (ref 26.0–34.0)
MCHC: 31.9 g/dL (ref 30.0–36.0)
MCV: 94.7 fL (ref 80.0–100.0)
Monocytes Absolute: 0.3 10*3/uL (ref 0.1–1.0)
Monocytes Relative: 8 %
Neutro Abs: 2.1 10*3/uL (ref 1.7–7.7)
Neutrophils Relative %: 54 %
Platelet Count: 124 10*3/uL — ABNORMAL LOW (ref 150–400)
RBC: 3.04 MIL/uL — ABNORMAL LOW (ref 3.87–5.11)
RDW: 15.9 % — ABNORMAL HIGH (ref 11.5–15.5)
WBC Count: 3.8 10*3/uL — ABNORMAL LOW (ref 4.0–10.5)
nRBC: 0 % (ref 0.0–0.2)

## 2023-01-10 MED ORDER — SODIUM CHLORIDE 0.9 % IV SOLN
20.0000 mg | Freq: Once | INTRAVENOUS | Status: AC
  Administered 2023-01-10: 20 mg via INTRAVENOUS
  Filled 2023-01-10: qty 20

## 2023-01-10 MED ORDER — SODIUM CHLORIDE 0.9 % IV SOLN
Freq: Once | INTRAVENOUS | Status: DC
  Filled 2023-01-10: qty 250

## 2023-01-10 MED ORDER — SODIUM CHLORIDE 0.9 % IV SOLN
Freq: Once | INTRAVENOUS | Status: AC
  Filled 2023-01-10: qty 250

## 2023-01-10 MED ORDER — FAMOTIDINE IN NACL 20-0.9 MG/50ML-% IV SOLN
20.0000 mg | Freq: Once | INTRAVENOUS | Status: AC
  Administered 2023-01-10: 20 mg via INTRAVENOUS
  Filled 2023-01-10: qty 50

## 2023-01-10 MED ORDER — HEPARIN SOD (PORK) LOCK FLUSH 100 UNIT/ML IV SOLN
500.0000 [IU] | Freq: Once | INTRAVENOUS | Status: AC | PRN
  Administered 2023-01-10: 500 [IU]
  Filled 2023-01-10: qty 5

## 2023-01-10 MED ORDER — MONTELUKAST SODIUM 10 MG PO TABS
10.0000 mg | ORAL_TABLET | Freq: Once | ORAL | Status: AC
  Administered 2023-01-10: 10 mg via ORAL
  Filled 2023-01-10: qty 1

## 2023-01-10 MED ORDER — DEXTROSE 5 % IV SOLN
56.0000 mg/m2 | Freq: Once | INTRAVENOUS | Status: AC
  Administered 2023-01-10: 100 mg via INTRAVENOUS
  Filled 2023-01-10: qty 30

## 2023-01-10 MED ORDER — SODIUM CHLORIDE 0.9 % IV SOLN
10.0000 mg/kg | Freq: Once | INTRAVENOUS | Status: AC
  Administered 2023-01-10: 800 mg via INTRAVENOUS
  Filled 2023-01-10: qty 25

## 2023-01-10 MED ORDER — ACETAMINOPHEN 325 MG PO TABS
650.0000 mg | ORAL_TABLET | Freq: Once | ORAL | Status: AC
  Administered 2023-01-10: 650 mg via ORAL
  Filled 2023-01-10: qty 2

## 2023-01-10 MED ORDER — DIPHENHYDRAMINE HCL 50 MG/ML IJ SOLN
50.0000 mg | Freq: Once | INTRAMUSCULAR | Status: AC
  Administered 2023-01-10: 50 mg via INTRAVENOUS
  Filled 2023-01-10: qty 1

## 2023-01-10 MED FILL — Dexamethasone Sodium Phosphate Inj 100 MG/10ML: INTRAMUSCULAR | Qty: 2 | Status: AC

## 2023-01-10 NOTE — Progress Notes (Signed)
[facility] OFFICE PROGRESS NOTE  Patient Care Team: Leanna Sato, MD as PCP - General (Family Medicine) End, Cristal Deer, MD as PCP - Cardiology (Cardiology) Eddie Candle, [name] Earna Coder, MD as Consulting Physician (Hematology and Oncology) Vida Rigger, MD as Consulting Physician (Pulmonary Disease) Tedd Sias Marlana Salvage, MD as Consulting Physician (Endocrinology)   Cancer Staging  No matching staging information was found for the patient.  Oncology History Overview Note  # SEP 2018- MULTIPLE MYELOMA IgALamda [2.5 gm/dl; K/L= 82/9562]; STAGE III [beta 2 microglobulin=5.5] [presented with acute renal failure; anemia; NO hypercalcemia; Skeletal survey-Normal]; BMBx- 45% plasma cells; FISH-POSITIVE 11:14 translocation.[STANDARD-high RISK]/cyto-Normal; SEP 2018- PET- L3 posterior element lesion.   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 No morphologic support for residual myeloma disease. Negative for minimal residual disease by MM-MRD flow cytometry; HOLD REVLIMID [leg swelling]; AUG 2021-PET scan negative for myeloma; continue to hold Revlimid  # MARCH 2021-diastolic congestive heart failure [Dr.End]  # BMBx- OCT 2021- BMBx- 5% plasmacytosis-however this appears to be more polyclonal rather than monoclonal-not explain patient worsening anemia.   # OCT 2022- 18th- Dara- Rev-Dex; MARCH, 2024- PROGRESSION bone marrow biopsy  70% plasma cells; PET scan no bone lesions-however conus medullaris uptake-MRI lumbar spine-   # MAY 2024- DISCONTINUE carfilzomib-dexamethasone- Pomalyst; [rising M-protein; MAY 2024- 1.3. ]   # start 12/27/2022 -Isa-Carfil-Dex   #November 2021-hyperthyroidism/goiter- [D.Bennett/Dr.Solum]-methimazole. S/p Thyroidectomy [Dr.Kim; [facility]-AUG 2022]  --------------------------  # 12/12- RIGHT JUGULAR DVT-x 40m on xarelto; finished April 2020; September 2020-EGD/dysphagia; Dr. Tobi Bastos  # Acute renal failure [Dr.Singh; Proteinuria 1.5gm/day ]; acyclovir/Asprin ------------------------------------------------------------------------------------------------------------   DIAGNOSIS: [ ]  MULTIPLE MYELOMA  STAGE: III/HIGH RISK ;GOALS: CONTROL      Multiple myeloma not having achieved remission (HCC)  11/21/2017 - 04/28/2018 Chemotherapy   Patient is on Treatment Plan : MYELOMA SALVAGE Cyclophosphamide / Carfilzomib / Dexamethasone (CCd) q28d     05/12/2021 - 02/25/2022 Chemotherapy   Patient is on Treatment Plan : MYELOMA RELAPSED REFRACTORY Daratumumab SQ + Lenalidomide + Dexamethasone (DaraRd) q28d     05/12/2021 - 07/23/2022 Chemotherapy   Patient is on Treatment Plan : MYELOMA RELAPSED REFRACTORY Daratumumab SQ + Lenalidomide + Dexamethasone (DaraRd) q28d     11/02/2022 - 11/16/2022 Chemotherapy   Patient is on Treatment Plan : MYELOMA RELAPSED/ REFRACTORY Carfilzomib D1,8,15 (20/27) + Pomalidomide + Dexamethasone (KPd) q28d     12/27/2022 -  Chemotherapy   Patient is on Treatment Plan : MYELOMA Carfilzomib + Dexamethasone + Isatuximab q28d      INTERVAL HISTORY: Ambulating  independently.   Alone.  Brandi [name] 72 y.o.  female pleasant patient above history of relapsed multiple myeloma--currently  [name]- Carfil-Dex is here for a follow up.    No infections. Denies any worsening pain.  Patient taking tramadol as needed for leg cramps.    Review of Systems  Constitutional:  Positive for  malaise/fatigue. Negative for chills, diaphoresis and fever.  HENT:  Negative for nosebleeds and sore throat.   Eyes:  Negative for double vision.  Respiratory:  Negative for hemoptysis.   Cardiovascular:  Negative for chest pain, palpitations and orthopnea.  Gastrointestinal:  Negative for abdominal pain, blood in stool, constipation, heartburn and melena.  Genitourinary:  Negative for dysuria, frequency and urgency.  Musculoskeletal:  Positive for back pain.  Skin: Negative.  Negative for itching and rash.  Neurological:  Negative for dizziness, tingling, focal weakness, weakness and headaches.  Endo/Heme/Allergies:  Does not bruise/bleed easily.  Psychiatric/Behavioral:  Negative for depression. The patient is not nervous/anxious.     PAST MEDICAL HISTORY :  Past Medical History:  Diagnosis Date   (HFpEF) heart failure with preserved ejection fraction (HCC)    a. 06/2020 Echo: EF 60-65%, no rwma, nl RV size/fxn. Triv MR. Triv TR/AI. Mod elev PASP.   Anxiety    COPD (chronic obstructive pulmonary disease) (HCC)    GERD (gastroesophageal reflux disease)    Hypertension    Hyperthyroidism    Hypokalemia    IgA myeloma (HCC)    Multiple myeloma (HCC)    Pneumonia    Red blood cell antibody positive, compatible PRBC difficult to obtain    S/P autologous bone marrow transplantation (HCC)     PAST SURGICAL HISTORY :   Past Surgical History:  Procedure Laterality Date   ANTERIOR VITRECTOMY Left 10/07/2021   Procedure: ANTERIOR VITRECTOMY;  Surgeon: Lockie Mola, MD;  Location: [facility];  Service: Ophthalmology;  Laterality: Left;   CATARACT EXTRACTION W/PHACO Left 10/07/2021   Procedure: CATARACT EXTRACTION PHACO AND INTRAOCULAR LENS PLACEMENT (IOC) LEFT VISION BLUE;  Surgeon: Lockie Mola, MD;  Location: [facility];  Service: Ophthalmology;  Laterality: Left;  kit for manual incision 14.56 01:21.4   CATARACT EXTRACTION W/PHACO Right 10/21/2021    Procedure: CATARACT EXTRACTION PHACO AND INTRAOCULAR LENS PLACEMENT (IOC) RIGHT 11.12 01:48.1;  Surgeon: Lockie Mola, MD;  Location: [facility];  Service: Ophthalmology;  Laterality: Right;   ESOPHAGOGASTRODUODENOSCOPY (EGD) WITH PROPOFOL N/A 03/30/2019   Procedure: ESOPHAGOGASTRODUODENOSCOPY (EGD) WITH PROPOFOL;  Surgeon: Wyline Mood, MD;  Location: [facility];  Service: Gastroenterology;  Laterality: N/A;   IR FLUORO GUIDE PORT INSERTION RIGHT  12/16/2017   IR IMAGING GUIDED PORT INSERTION  06/15/2021   KNEE ARTHROSCOPY Left 09/03/2022   Procedure: ARTHROSCOPY KNEE DEBRIDEMENT;  Surgeon: Lyndle Herrlich, MD;  Location: [facility] ORS;  Service: Orthopedics;  Laterality: Left;   THYROIDECTOMY N/A     FAMILY HISTORY :   Family History  Problem Relation Age of Onset   Pneumonia Mother    Seizures Father     SOCIAL HISTORY:   Social History   Tobacco Use   Smoking status: Former    Packs/day: .25    Types: Cigarettes    Quit date: 03/18/2021    Years since quitting: 1.8   Smokeless tobacco: Never   Tobacco comments:    2 cigarettes QOD  Vaping Use   Vaping Use: Never used  Substance Use Topics   Alcohol use: No   Drug use: No    ALLERGIES:  has No Known Allergies.  MEDICATIONS:  Current Outpatient Medications  Medication Sig Dispense Refill   acyclovir (ZOVIRAX) 400 MG tablet Take 1 tablet by mouth twice daily 60 tablet 0   albuterol (VENTOLIN HFA) 108 (90 Base) MCG/ACT inhaler Inhale 2 puffs into the lungs every 6 (six) hours as needed for wheezing or shortness of breath. 8 g 3   aspirin EC 81 MG EC tablet Take 1 tablet (81 mg total) by mouth daily. 90 tablet 3  benzonatate (TESSALON) 100 MG capsule TAKE 1 CAPSULE BY MOUTH TWICE DAILY AS NEEDED FOR COUGH 40 capsule 0   calcium-vitamin D (OSCAL WITH D) 500MG -200UNIT ( ) tablet Take 1 tablet by mouth 2 (two) times daily. 60 tablet 0   carvedilol (COREG) 3.125 MG tablet Take 1 tablet (3.125 mg total) by  mouth 2 (two) times daily with a meal. 60 tablet 5   furosemide (LASIX) 40 MG tablet Take 1 tablet (40 mg total) by mouth daily. 90 tablet 3   levothyroxine (SYNTHROID) 100 MCG tablet TAKE 1 TABLET BY MOUTH BEFORE BREAKFAST 90 tablet 0   lidocaine-prilocaine (EMLA) cream Apply 1 application topically as needed. Apply to port and cover with saran wrap 1-2 hours prior to port access 30 g 1   magnesium oxide (MAG-OX) 400 (240 Mg) MG tablet Take 1 tablet (400 mg total) by mouth 2 (two) times daily. 60 tablet 3   methylPREDNISolone (MEDROL DOSEPAK) 4 MG TBPK tablet Use as directed. 21 tablet 1   montelukast (SINGULAIR) 10 MG tablet Take 1 tablet (10 mg total) by mouth at bedtime. 30 tablet 3   ondansetron (ZOFRAN) 8 MG tablet One pill every 8 hours as needed for nausea/vomitting. 40 tablet 1   polyethylene glycol (MIRALAX / GLYCOLAX) packet Take 17 g by mouth daily as needed for mild constipation. 14 each 0   pomalidomide (POMALYST) 4 MG capsule Take 1 capsule (4 mg total) by mouth daily. Take for 21 days, then old for 7 days. Repeat every 28 days. 21 capsule 0   potassium chloride SA (KLOR-CON M) 20 MEQ tablet Take 1 tablet (20 mEq total) by mouth 2 (two) times daily. 60 tablet 6   spironolactone (ALDACTONE) 25 MG tablet Take 1 tablet by mouth once daily 90 tablet 3   traMADol (ULTRAM) 50 MG tablet Take 1 tablet (50 mg total) by mouth every 12 (twelve) hours as needed. 60 tablet 0   Vitamin D, Ergocalciferol, (DRISDOL) 1.25 MG (50000 UNIT) CAPS capsule Take 1 capsule (50,000 Units total) by mouth once a week. 12 capsule 0   pantoprazole (PROTONIX) 40 MG tablet Take 1 tablet (40 mg total) by mouth daily. 90 tablet 0   No current facility-administered medications for this visit.   Facility-Administered Medications Ordered in Other Visits  Medication Dose Route Frequency Provider Last Rate Last Admin   0.9 %  sodium chloride infusion   Intravenous Continuous Ralene Muskrat, PA-C       0.9 %  sodium  chloride infusion   Intravenous Once Earna Coder, MD        PHYSICAL EXAMINATION:   BP 125/68 (BP Location: Left Arm, Patient Position: Sitting, Cuff Size: Large)   Pulse 69   Temp 98.5 F (36.9 C) (Tympanic)   Ht 5\' 2"  (1.575 m)   Wt 172 lb 3.2 oz (78.1 kg)   SpO2 99%   BMI 31.50 kg/m   Filed Weights   01/10/23 1106  Weight: 172 lb 3.2 oz (78.1 kg)   Bilateral coarse breath sounds.   Physical Exam HENT:     Head: Normocephalic and atraumatic.  Eyes:     Pupils: Pupils are equal, round, and reactive to light.  Cardiovascular:     Rate and Rhythm: Normal rate and regular rhythm.  Pulmonary:     Effort: Pulmonary effort is normal. No respiratory distress.     Breath sounds: Normal breath sounds. No wheezing.  Abdominal:     General: Bowel sounds are normal. There is  no distension.     Palpations: Abdomen is soft. There is no mass.     Tenderness: There is no abdominal tenderness. There is no guarding or rebound.  Musculoskeletal:        General: No tenderness. Normal range of motion.     Cervical back: Normal range of motion and neck supple.  Skin:    General: Skin is warm.  Neurological:     Mental Status: She is alert and oriented to person, place, and time.  Psychiatric:        Mood and Affect: Affect normal.    LABORATORY DATA:  I have reviewed the data as listed    Component Value Date/Time   NA 139 01/10/2023 1036   NA 141 01/25/2020 1530   K 3.5 01/10/2023 1036   CL 108 01/10/2023 1036   CO2 22 01/10/2023 1036   GLUCOSE 88 01/10/2023 1036   BUN 14 01/10/2023 1036   BUN 19 01/25/2020 1530   CREATININE 0.79 01/10/2023 1036   CALCIUM 8.2 (L) 01/10/2023 1036   PROT 6.8 01/10/2023 1036   ALBUMIN 3.6 01/10/2023 1036   AST 13 (L) 01/10/2023 1036   ALT 10 01/10/2023 1036   ALKPHOS 54 01/10/2023 1036   BILITOT 0.7 01/10/2023 1036   GFRNONAA >60 01/10/2023 1036   GFRAA NOT CALCULATED 04/17/2020 1352    No results found for: "SPEP",  "UPEP"  Lab Results  Component Value Date   WBC 3.8 (L) 01/10/2023   NEUTROABS 2.1 01/10/2023   HGB 9.2 (L) 01/10/2023   HCT 28.8 (L) 01/10/2023   MCV 94.7 01/10/2023   PLT 124 (L) 01/10/2023      Chemistry      Component Value Date/Time   NA 139 01/10/2023 1036   NA 141 01/25/2020 1530   K 3.5 01/10/2023 1036   CL 108 01/10/2023 1036   CO2 22 01/10/2023 1036   BUN 14 01/10/2023 1036   BUN 19 01/25/2020 1530   CREATININE 0.79 01/10/2023 1036      Component Value Date/Time   CALCIUM 8.2 (L) 01/10/2023 1036   ALKPHOS 54 01/10/2023 1036   AST 13 (L) 01/10/2023 1036   ALT 10 01/10/2023 1036   BILITOT 0.7 01/10/2023 1036      (H): Data is abnormally high   Latest Reference Range & Units Most Recent 12/24/20 14:01 04/01/21 14:34 04/28/21 09:17 06/11/21 09:42 07/03/21 10:32 08/20/21 10:06  M Protein SerPl Elph-Mcnc Not Observed g/dL 1.1 (H) (C) 0/45/40 98:11 Not Observed (C) 0.6 (H) (C) 0.7 (H) (C) 0.5 (H) (C) 0.7 (H) (C) 1.1 (H) (C)    Latest Reference Range & Units Most Recent 09/17/21 08:47 10/15/21 08:25 11/12/21 08:55  Kappa free light chain 3.3 - 19.4 mg/L 5.0 11/12/21 08:55 7.2 7.2 5.0  Lambda free light chains 5.7 - 26.3 mg/L 223.6 (H) 11/12/21 08:55 215.3 (H) 340.2 (H) 223.6 (H)  Kappa, lambda light chain ratio 0.26 - 1.65  0.02 (L) 11/12/21 08:55 0.03 (L) 0.02 (L) 0.02 (L)  (H): Data is abnormally high (L): Data is abnormally low  RADIOGRAPHIC STUDIES: I have personally reviewed the radiological images as listed and agreed with the findings in the report. No results found.   ASSESSMENT & PLAN:  Multiple myeloma not having achieved remission (HCC) # RECURRENT Multiple myeloma stage III [high-risk cytogenetics-status post KRD-VGPR ; status post autologous stem cell transplant on 06/15/18.  Most recently progressed on salvage therapy with daratumumab  lenalidomide [15 mg 3 w; 1-w] dexamethasone. plasma cell  myeloma by approximately 70% of the overall cellular  marrow;  FEB 2024-lambda light chain 284 Bone marrow-70% plasma cells. M Protein 0.9-rising lambda light chain ratio. Discontinue Revlimid; and dara-given the progressive disease.  MARCH 2024- PET scan: No skeletal lesions noted. APRIl 2024- MRI-cervical/thoracic/lumbar spine-negative for any lesions in the spinal cord. DISCONTINUEPomalyst;- carfilzomib-dexamethasone- [rising M-protein; MAY 2024- 1.3. ] start 12/27/2022-Isa-Carfil-Dex [[facility]]  #  12/27/2022-Isa-Dex; d-2carfil weekly weeklyx4q 28 days  [[facility]] [facility] 2024- Left ventricular ejection fraction, by estimation, is 50 to 55%.  Will need to repeat 2 D echo after cycle #3.   # Frequent sinus/acute bronchitis infection-status post antibiotics improved. Septic arthritis-[April 2024-; Dr.Bowers] -IgG levels less than 400.s/p with IVIG infusions 400 mg/kg x2 - no infections- stable.  # Hypokalemia: mild- recommend Kdur TID; hypocalcemia- 8.0- vit D march 2024- 50- recommend ca+vit D BID- stable.   # Left knee septic arthritis/immunocompromised host- [s/p Dr.Bowers; ID]; currently-S/p evaluation with ID-s/p antibioics- continue tramadol. stable.   # Iatrogenic hypothyroidism [ Graves/goiter s/p total thyroidectomy;aug,2022 ]-currently on Synthroid 100 mcg/day-MARCH 2024- TSH- 33; recommend  taking the pills fasting- Stable.   # Lower back discomfort x 6 months-see above.  MRI lumbar spine as above.  Continue tramadol.  Stable.  # Bone lesions /last zometa on 11/27/2017.    Dental extraction appointment appt-on Jan 26th.  PET scan evidence of any bone lesions.-Hold Zometa for now. Stable.  # IV access: mediport- functional  # Vaccination: Flu shot s/p;  s/p Pneumonia vaccination [march 2024].   6 w- given dental extrac- on 1/26. :MM panel; K/l light chain ratio q 4 W D-isa-dex;car1&2-car; weekly x 4 q 28 day cycle  # DISPOSITION:  # as per IS-  chemo  today # as per IS-  # 2 weeks- APP- labs- chemo; D-2 chemo; # 3 weeks- labs- chemo; D-2  chemo #  4 weeks- labs-:MM panel; K/l light chain ratio; chemo; D-2 chemo - Dr.B   Orders Placed This Encounter  Procedures   CBC with Differential (Cancer Center Only)    Standing Status:   Future    Standing Expiration Date:   01/24/2024   CMP (Cancer Center only)    Standing Status:   Future    Standing Expiration Date:   01/24/2024   CBC with Differential (Cancer Center Only)    Standing Status:   Future    Standing Expiration Date:   01/31/2024   CMP (Cancer Center only)    Standing Status:   Future    Standing Expiration Date:   01/31/2024   CBC with Differential (Cancer Center Only)    Standing Status:   Future    Standing Expiration Date:   02/07/2024   CMP (Cancer Center only)    Standing Status:   Future    Standing Expiration Date:   02/07/2024   All questions were answered. The patient knows to call the clinic with any problems, questions or concerns.      Earna Coder, MD 01/10/2023 4:07 PM

## 2023-01-10 NOTE — Patient Instructions (Signed)
[facility]  Discharge Instructions: Thank you for choosing [facility] to provide your oncology and hematology care.  If you have a lab appointment with the Cancer Center, please go directly to the Cancer Center and check in at the registration area.  Wear comfortable clothing and clothing appropriate for easy access to any Portacath or PICC line.   We strive to give you quality time with your provider. You may need to reschedule your appointment if you arrive late (15 or more minutes).  Arriving late affects you and other patients whose appointments are after yours.  Also, if you miss three or more appointments without notifying the office, you may be dismissed from the clinic at the provider's discretion.      For prescription refill requests, have your pharmacy contact our office and allow 72 hours for refills to be completed.     To help prevent nausea and vomiting after your treatment, we encourage you to take your nausea medication as directed.  BELOW ARE SYMPTOMS THAT SHOULD BE REPORTED IMMEDIATELY: *FEVER GREATER THAN 100.4 F (38 C) OR HIGHER *CHILLS OR SWEATING *NAUSEA AND VOMITING THAT IS NOT CONTROLLED WITH YOUR NAUSEA MEDICATION *UNUSUAL SHORTNESS OF BREATH *UNUSUAL BRUISING OR BLEEDING *URINARY PROBLEMS (pain or burning when urinating, or frequent urination) *BOWEL PROBLEMS (unusual diarrhea, constipation, pain near the anus) TENDERNESS IN MOUTH AND THROAT WITH OR WITHOUT PRESENCE OF ULCERS (sore throat, sores in mouth, or a toothache) UNUSUAL RASH, SWELLING OR PAIN  UNUSUAL VAGINAL DISCHARGE OR ITCHING   Items with * indicate a potential emergency and should be followed up as soon as possible or go to the Emergency Department if any problems should occur.  Please show the CHEMOTHERAPY ALERT CARD or IMMUNOTHERAPY ALERT CARD at check-in to the Emergency Department and triage nurse.  Should you have questions after your visit  or need to cancel or reschedule your appointment, please contact [facility]  [phone] and follow the prompts.  Office hours are 8:00 a.m. to 4:30 p.m. Monday - Friday. Please note that voicemails left after 4:00 p.m. may not be returned until the following business day.  We are closed weekends and major holidays. You have access to a nurse at all times for urgent questions. Please call the main number to the clinic [phone] and follow the prompts.  For any non-urgent questions, you may also contact your provider using MyChart. We now offer e-Visits for anyone 18 and older to request care online for non-urgent symptoms. For details visit [email or web address].   Also download the MyChart app! Go to the app store, search "MyChart", open the app, select [facility], and log in with your MyChart username and password.    

## 2023-01-10 NOTE — Progress Notes (Signed)
No concerns today 

## 2023-01-10 NOTE — Assessment & Plan Note (Addendum)
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  Most recently progressed on salvage therapy with daratumumab  lenalidomide [15 mg 3 w; 1-w] dexamethasone. plasma cell myeloma by approximately 70% of the overall cellular marrow;  FEB 2024-lambda light chain 284 Bone marrow-70% plasma cells. M Protein 0.9-rising lambda light chain ratio. Discontinue Revlimid; and dara-given the progressive disease.  MARCH 2024- PET scan: No skeletal lesions noted. APRIl 2024- MRI-cervical/thoracic/lumbar spine-negative for any lesions in the spinal cord. DISCONTINUEPomalyst;- carfilzomib-dexamethasone- [rising M-protein; MAY 2024- 1.3. ] start 12/27/2022-Isa-Carfil-Dex [[facility]]  #  12/27/2022-Isa-Dex; d-2carfil weekly weeklyx4q 28 days  [[facility]] [facility] 2024- Left ventricular ejection fraction, by estimation, is 50 to 55%.  Will need to repeat 2 D echo after cycle #3.   # Frequent sinus/acute bronchitis infection-status post antibiotics improved. Septic arthritis-[April 2024-; Dr.Bowers] -IgG levels less than 400.s/p with IVIG infusions 400 mg/kg x2 - no infections- stable.  # Hypokalemia: mild- recommend Kdur TID; hypocalcemia- 8.0- vit D march 2024- 50- recommend ca+vit D BID- stable.   # Left knee septic arthritis/immunocompromised host- [s/p Dr.Bowers; ID]; currently-S/p evaluation with ID-s/p antibioics- continue tramadol. stable.   # Iatrogenic hypothyroidism [ Graves/goiter s/p total thyroidectomy;aug,2022 ]-currently on Synthroid 100 mcg/day-MARCH 2024- TSH- 33; recommend  taking the pills fasting- Stable.   # Lower back discomfort x 6 months-see above.  MRI lumbar spine as above.  Continue tramadol.  Stable.  # Bone lesions /last zometa on 11/27/2017.    Dental extraction appointment appt-on Jan 26th.  PET scan evidence of any bone lesions.-Hold Zometa for now. Stable.  # IV access: mediport- functional  # Vaccination: Flu  shot s/p;  s/p Pneumonia vaccination [march 2024].   7 w- given dental extrac- on 1/26. :MM panel; K/l light chain ratio q 4 W D-isa-dex;car1&2-car; weekly x 4 q 28 day cycle  # DISPOSITION:  # as per IS-  chemo  today # as per IS-  # 2 weeks- APP- labs- chemo; D-2 chemo; # 3 weeks- labs- chemo; D-2 chemo #  4 weeks- labs-:MM panel; K/l light chain ratio; chemo; D-2 chemo - Dr.B

## 2023-01-11 ENCOUNTER — Ambulatory Visit: Payer: 59 | Admitting: Internal Medicine

## 2023-01-11 ENCOUNTER — Inpatient Hospital Stay: Payer: 59

## 2023-01-11 ENCOUNTER — Other Ambulatory Visit: Payer: 59

## 2023-01-11 VITALS — BP 135/85 | HR 72 | Temp 98.1°F | Resp 18

## 2023-01-11 DIAGNOSIS — Z5112 Encounter for antineoplastic immunotherapy: Secondary | ICD-10-CM | POA: Diagnosis not present

## 2023-01-11 DIAGNOSIS — C9000 Multiple myeloma not having achieved remission: Secondary | ICD-10-CM

## 2023-01-11 MED ORDER — SODIUM CHLORIDE 0.9 % IV SOLN
20.0000 mg | Freq: Once | INTRAVENOUS | Status: AC
  Administered 2023-01-11: 20 mg via INTRAVENOUS
  Filled 2023-01-11: qty 20

## 2023-01-11 MED ORDER — HEPARIN SOD (PORK) LOCK FLUSH 100 UNIT/ML IV SOLN
500.0000 [IU] | Freq: Once | INTRAVENOUS | Status: AC | PRN
  Administered 2023-01-11: 500 [IU]
  Filled 2023-01-11: qty 5

## 2023-01-11 MED ORDER — SODIUM CHLORIDE 0.9 % IV SOLN
Freq: Once | INTRAVENOUS | Status: AC
  Filled 2023-01-11: qty 250

## 2023-01-11 MED ORDER — DEXTROSE 5 % IV SOLN
56.0000 mg/m2 | Freq: Once | INTRAVENOUS | Status: AC
  Administered 2023-01-11: 100 mg via INTRAVENOUS
  Filled 2023-01-11: qty 30

## 2023-01-11 MED ORDER — SODIUM CHLORIDE 0.9% FLUSH
10.0000 mL | INTRAVENOUS | Status: DC | PRN
  Administered 2023-01-11: 10 mL
  Filled 2023-01-11: qty 10

## 2023-01-11 NOTE — Patient Instructions (Signed)
[place] [facility]  Discharge Instructions: Thank you for choosing [facility] to provide your oncology and hematology care.  If you have a lab appointment with the Cancer Center, please go directly to the Cancer Center and check in at the registration area.  Wear comfortable clothing and clothing appropriate for easy access to any Portacath or PICC line.   We strive to give you quality time with your provider. You may need to reschedule your appointment if you arrive late (15 or more minutes).  Arriving late affects you and other patients whose appointments are after yours.  Also, if you miss three or more appointments without notifying the office, you may be dismissed from the clinic at the provider's discretion.      For prescription refill requests, have your pharmacy contact our office and allow 72 hours for refills to be completed.    Today you received the following chemotherapy and/or immunotherapy agents KYPHOLIS      To help prevent nausea and vomiting after your treatment, we encourage you to take your nausea medication as directed.  BELOW ARE SYMPTOMS THAT SHOULD BE REPORTED IMMEDIATELY: *FEVER GREATER THAN 100.4 F (38 C) OR HIGHER *CHILLS OR SWEATING *NAUSEA AND VOMITING THAT IS NOT CONTROLLED WITH YOUR NAUSEA MEDICATION *UNUSUAL SHORTNESS OF BREATH *UNUSUAL BRUISING OR BLEEDING *URINARY PROBLEMS (pain or burning when urinating, or frequent urination) *BOWEL PROBLEMS (unusual diarrhea, constipation, pain near the anus) TENDERNESS IN MOUTH AND THROAT WITH OR WITHOUT PRESENCE OF ULCERS (sore throat, sores in mouth, or a toothache) UNUSUAL RASH, SWELLING OR PAIN  UNUSUAL VAGINAL DISCHARGE OR ITCHING   Items with * indicate a potential emergency and should be followed up as soon as possible or go to the Emergency Department if any problems should occur.  Please show the CHEMOTHERAPY ALERT CARD or IMMUNOTHERAPY ALERT CARD at check-in to  the Emergency Department and triage nurse.  Should you have questions after your visit or need to cancel or reschedule your appointment, please contact [place] [facility]  [phone] and follow the prompts.  Office hours are 8:00 a.m. to 4:30 p.m. Monday - Friday. Please note that voicemails left after 4:00 p.m. may not be returned until the following business day.  We are closed weekends and major holidays. You have access to a nurse at all times for urgent questions. Please call the main number to the clinic [phone] and follow the prompts.  For any non-urgent questions, you may also contact your provider using MyChart. We now offer e-Visits for anyone 82 and older to request care online for non-urgent symptoms. For details visit [email or web address].   Also download the MyChart app! Go to the app store, search "MyChart", open the app, select [place], and log in with your MyChart username and password.  Carfilzomib Injection What is this medication? CARFILZOMIB (kar FILZ oh mib) treats multiple myeloma, a type of bone marrow cancer. It works by blocking a protein that causes cancer cells to grow and multiply. This helps to slow or stop the spread of cancer cells. This medicine may be used for other purposes; ask your health care provider or pharmacist if you have questions. COMMON BRAND NAME(S): KYPROLIS What should I tell my care team before I take this medication? They need to know if you have any of these conditions: Heart disease History of blood clots Irregular heartbeat Kidney disease Liver disease Lung or breathing disease An unusual or allergic reaction to carfilzomib,  or other medications, foods, dyes, or preservatives If you or your partner are pregnant or trying to get pregnant Breastfeeding How should I use this medication? This medication is injected into a vein. It is given by your care team in a hospital or clinic setting. Talk to  your care team about the use of this medication in children. Special care may be needed. Overdosage: If you think you have taken too much of this medicine contact a poison control center or emergency room at once. NOTE: This medicine is only for you. Do not share this medicine with others. What if I miss a dose? Keep appointments for follow-up doses. It is important not to miss your dose. Call your care team if you are unable to keep an appointment. What may interact with this medication? Interactions are not expected. This list may not describe all possible interactions. Give your health care provider a list of all the medicines, herbs, non-prescription drugs, or dietary supplements you use. Also tell them if you smoke, drink alcohol, or use illegal drugs. Some items may interact with your medicine. What should I watch for while using this medication? Your condition will be monitored carefully while you are receiving this medication. You may need blood work while taking this medication. Check with your care team if you have severe diarrhea, nausea, and vomiting, or if you sweat a lot. The loss of too much body fluid may make it dangerous for you to take this medication. This medication may affect your coordination, reaction time, or judgment. Do not drive or operate machinery until you know how this medication affects you. Sit up or stand slowly to reduce the risk of dizzy or fainting spells. Drinking alcohol with this medication can increase the risk of these side effects. Talk to your care team if you may be pregnant. Serious birth defects can occur if you take this medication during pregnancy and for 6 months after the last dose. You will need a negative pregnancy test before starting this medication. Contraception is recommended while taking this medication and for 6 months after the last dose. Your care team can help you find an option that works for you. If your partner can get pregnant, use a  condom during sex while taking this medication and for 3 months after the last dose. Do not breastfeed while taking this medication and for 2 weeks after the last dose. This medication may cause infertility. Talk to your care team if you are concerned about your fertility. What side effects may I notice from receiving this medication? Side effects that you should report to your care team as soon as possible: Allergic reactions--skin rash, itching, hives, swelling of the face, lips, tongue, or throat Bleeding--bloody or black, tar-like stools, vomiting blood or brown material that looks like coffee grounds, red or dark brown urine, small red or purple spots on skin, unusual bruising or bleeding Blood clot--pain, swelling, or warmth in the leg, shortness of breath, chest pain Dizziness, loss of balance or coordination, confusion or trouble speaking Heart attack--pain or tightness in the chest, shoulders, arms, or jaw, nausea, shortness of breath, cold or clammy skin, feeling faint or lightheaded Heart failure--shortness of breath, swelling of the ankles, feet, or hands, sudden weight gain, unusual weakness or fatigue Heart rhythm changes--fast or irregular heartbeat, dizziness, feeling faint or lightheaded, chest pain, trouble breathing Increase in blood pressure Infection--fever, chills, cough, sore throat, wounds that don't heal, pain or trouble when passing urine, general feeling of discomfort  or being unwell Infusion reactions--chest pain, shortness of breath or trouble breathing, feeling faint or lightheaded Kidney injury--decrease in the amount of urine, swelling of the ankles, hands, or feet Liver injury--right upper belly pain, loss of appetite, nausea, light-colored stool, dark yellow or brown urine, yellowing skin or eyes, unusual weakness or fatigue Lung injury--shortness of breath or trouble breathing, cough, spitting up blood, chest pain, fever Pulmonary hypertension--shortness of  breath, chest pain, fast or irregular heartbeat, feeling faint or lightheaded, fatigue, swelling of the ankles or feet Stomach pain, bloody diarrhea, pale skin, unusual weakness or fatigue, decrease in the amount of urine, which may be signs of hemolytic uremic syndrome Sudden and severe headache, confusion, change in vision, seizures, which may be signs of posterior reversible encephalopathy syndrome (PRES) TTP--purple spots on the skin or inside the mouth, pale skin, yellowing skin or eyes, unusual weakness or fatigue, fever, fast or irregular heartbeat, confusion, change in vision, trouble speaking, trouble walking Tumor lysis syndrome (TLS)--nausea, vomiting, diarrhea, decrease in the amount of urine, dark urine, unusual weakness or fatigue, confusion, muscle pain or cramps, fast or irregular heartbeat, joint pain Side effects that usually do not require medical attention (report to your care team if they continue or are bothersome): Diarrhea Fatigue Nausea Trouble sleeping This list may not describe all possible side effects. Call your doctor for medical advice about side effects. You may report side effects to FDA at 1-800-FDA-1088. Where should I keep my medication? This medication is given in a hospital or clinic. It will not be stored at home. NOTE: This sheet is a summary. It may not cover all possible information. If you have questions about this medicine, talk to your doctor, pharmacist, or health care provider.  2024 Elsevier/Gold Standard (2021-12-10 00:00:00)

## 2023-01-13 ENCOUNTER — Other Ambulatory Visit: Payer: Self-pay

## 2023-01-17 ENCOUNTER — Inpatient Hospital Stay: Payer: 59

## 2023-01-17 ENCOUNTER — Ambulatory Visit: Payer: 59

## 2023-01-17 ENCOUNTER — Other Ambulatory Visit: Payer: Self-pay | Admitting: Internal Medicine

## 2023-01-17 ENCOUNTER — Ambulatory Visit: Payer: 59 | Admitting: Internal Medicine

## 2023-01-17 VITALS — BP 136/89 | HR 56 | Temp 97.1°F | Resp 18 | Ht 62.0 in | Wt 171.5 lb

## 2023-01-17 DIAGNOSIS — Z5112 Encounter for antineoplastic immunotherapy: Secondary | ICD-10-CM | POA: Diagnosis not present

## 2023-01-17 DIAGNOSIS — C9000 Multiple myeloma not having achieved remission: Secondary | ICD-10-CM

## 2023-01-17 LAB — CBC WITH DIFFERENTIAL (CANCER CENTER ONLY)
Abs Immature Granulocytes: 0.01 10*3/uL (ref 0.00–0.07)
Basophils Absolute: 0 10*3/uL (ref 0.0–0.1)
Basophils Relative: 0 %
Eosinophils Absolute: 0.1 10*3/uL (ref 0.0–0.5)
Eosinophils Relative: 2 %
HCT: 29.7 % — ABNORMAL LOW (ref 36.0–46.0)
Hemoglobin: 9.6 g/dL — ABNORMAL LOW (ref 12.0–15.0)
Immature Granulocytes: 0 %
Lymphocytes Relative: 31 %
Lymphs Abs: 1.1 10*3/uL (ref 0.7–4.0)
MCH: 30.5 pg (ref 26.0–34.0)
MCHC: 32.3 g/dL (ref 30.0–36.0)
MCV: 94.3 fL (ref 80.0–100.0)
Monocytes Absolute: 0.4 10*3/uL (ref 0.1–1.0)
Monocytes Relative: 13 %
Neutro Abs: 1.9 10*3/uL (ref 1.7–7.7)
Neutrophils Relative %: 54 %
Platelet Count: 116 10*3/uL — ABNORMAL LOW (ref 150–400)
RBC: 3.15 MIL/uL — ABNORMAL LOW (ref 3.87–5.11)
RDW: 16.2 % — ABNORMAL HIGH (ref 11.5–15.5)
WBC Count: 3.5 10*3/uL — ABNORMAL LOW (ref 4.0–10.5)
nRBC: 0 % (ref 0.0–0.2)

## 2023-01-17 LAB — CMP (CANCER CENTER ONLY)
ALT: 9 U/L (ref 0–44)
AST: 13 U/L — ABNORMAL LOW (ref 15–41)
Albumin: 3.8 g/dL (ref 3.5–5.0)
Alkaline Phosphatase: 57 U/L (ref 38–126)
Anion gap: 9 (ref 5–15)
BUN: 18 mg/dL (ref 8–23)
CO2: 22 mmol/L (ref 22–32)
Calcium: 8.1 mg/dL — ABNORMAL LOW (ref 8.9–10.3)
Chloride: 108 mmol/L (ref 98–111)
Creatinine: 0.81 mg/dL (ref 0.44–1.00)
GFR, Estimated: >60 mL/min (ref >60)
Glucose, Bld: 85 mg/dL (ref 70–99)
Potassium: 3.5 mmol/L (ref 3.5–5.1)
Sodium: 139 mmol/L (ref 135–145)
Total Bilirubin: 0.7 mg/dL (ref 0.3–1.2)
Total Protein: 6.6 g/dL (ref 6.5–8.1)

## 2023-01-17 MED ORDER — FAMOTIDINE IN NACL 20-0.9 MG/50ML-% IV SOLN
20.0000 mg | Freq: Once | INTRAVENOUS | Status: AC
  Administered 2023-01-17: 20 mg via INTRAVENOUS
  Filled 2023-01-17: qty 50

## 2023-01-17 MED ORDER — ACETAMINOPHEN 325 MG PO TABS
650.0000 mg | ORAL_TABLET | Freq: Once | ORAL | Status: AC
  Administered 2023-01-17: 650 mg via ORAL
  Filled 2023-01-17: qty 2

## 2023-01-17 MED ORDER — SODIUM CHLORIDE 0.9 % IV SOLN
Freq: Once | INTRAVENOUS | Status: AC
  Filled 2023-01-17: qty 250

## 2023-01-17 MED ORDER — HEPARIN SOD (PORK) LOCK FLUSH 100 UNIT/ML IV SOLN
500.0000 [IU] | Freq: Once | INTRAVENOUS | Status: AC | PRN
  Administered 2023-01-17: 500 [IU]
  Filled 2023-01-17: qty 5

## 2023-01-17 MED ORDER — SODIUM CHLORIDE 0.9% FLUSH
10.0000 mL | Freq: Once | INTRAVENOUS | Status: AC
  Administered 2023-01-17: 10 mL via INTRAVENOUS
  Filled 2023-01-17: qty 10

## 2023-01-17 MED ORDER — SODIUM CHLORIDE 0.9 % IV SOLN
10.0000 mg/kg | Freq: Once | INTRAVENOUS | Status: AC
  Administered 2023-01-17: 800 mg via INTRAVENOUS
  Filled 2023-01-17: qty 25

## 2023-01-17 MED ORDER — MONTELUKAST SODIUM 10 MG PO TABS
10.0000 mg | ORAL_TABLET | Freq: Once | ORAL | Status: AC
  Administered 2023-01-17: 10 mg via ORAL
  Filled 2023-01-17: qty 1

## 2023-01-17 MED ORDER — DIPHENHYDRAMINE HCL 50 MG/ML IJ SOLN
50.0000 mg | Freq: Once | INTRAMUSCULAR | Status: AC
  Administered 2023-01-17: 50 mg via INTRAVENOUS
  Filled 2023-01-17: qty 1

## 2023-01-17 MED ORDER — SODIUM CHLORIDE 0.9 % IV SOLN
20.0000 mg | Freq: Once | INTRAVENOUS | Status: AC
  Administered 2023-01-17: 20 mg via INTRAVENOUS
  Filled 2023-01-17: qty 20

## 2023-01-17 NOTE — Patient Instructions (Signed)
[place] [facility]  Discharge Instructions: Thank you for choosing [facility] to provide your oncology and hematology care.  If you have a lab appointment with the Cancer Center, please go directly to the Cancer Center and check in at the registration area.  Wear comfortable clothing and clothing appropriate for easy access to any Portacath or PICC line.   We strive to give you quality time with your provider. You may need to reschedule your appointment if you arrive late (15 or more minutes).  Arriving late affects you and other patients whose appointments are after yours.  Also, if you miss three or more appointments without notifying the office, you may be dismissed from the clinic at the provider's discretion.      For prescription refill requests, have your pharmacy contact our office and allow 72 hours for refills to be completed.    Today you received the following chemotherapy and/or immunotherapy agents sarclisa      To help prevent nausea and vomiting after your treatment, we encourage you to take your nausea medication as directed.  BELOW ARE SYMPTOMS THAT SHOULD BE REPORTED IMMEDIATELY: *FEVER GREATER THAN 100.4 F (38 C) OR HIGHER *CHILLS OR SWEATING *NAUSEA AND VOMITING THAT IS NOT CONTROLLED WITH YOUR NAUSEA MEDICATION *UNUSUAL SHORTNESS OF BREATH *UNUSUAL BRUISING OR BLEEDING *URINARY PROBLEMS (pain or burning when urinating, or frequent urination) *BOWEL PROBLEMS (unusual diarrhea, constipation, pain near the anus) TENDERNESS IN MOUTH AND THROAT WITH OR WITHOUT PRESENCE OF ULCERS (sore throat, sores in mouth, or a toothache) UNUSUAL RASH, SWELLING OR PAIN  UNUSUAL VAGINAL DISCHARGE OR ITCHING   Items with * indicate a potential emergency and should be followed up as soon as possible or go to the Emergency Department if any problems should occur.  Please show the CHEMOTHERAPY ALERT CARD or IMMUNOTHERAPY ALERT CARD at check-in to  the Emergency Department and triage nurse.  Should you have questions after your visit or need to cancel or reschedule your appointment, please contact [place] [facility]  [phone] and follow the prompts.  Office hours are 8:00 a.m. to 4:30 p.m. Monday - Friday. Please note that voicemails left after 4:00 p.m. may not be returned until the following business day.  We are closed weekends and major holidays. You have access to a nurse at all times for urgent questions. Please call the main number to the clinic [phone] and follow the prompts.  For any non-urgent questions, you may also contact your provider using MyChart. We now offer e-Visits for anyone 40 and older to request care online for non-urgent symptoms. For details visit [email or web address].   Also download the MyChart app! Go to the app store, search "MyChart", open the app, select [place], and log in with your MyChart username and password.

## 2023-01-18 LAB — KAPPA/LAMBDA LIGHT CHAINS
Kappa free light chain: 1.3 mg/L — ABNORMAL LOW (ref 3.3–19.4)
Kappa, lambda light chain ratio: 0.07 — ABNORMAL LOW (ref 0.26–1.65)
Lambda free light chains: 18.7 mg/L (ref 5.7–26.3)

## 2023-01-20 ENCOUNTER — Other Ambulatory Visit: Payer: Self-pay | Admitting: *Deleted

## 2023-01-20 DIAGNOSIS — C9000 Multiple myeloma not having achieved remission: Secondary | ICD-10-CM

## 2023-01-21 LAB — MULTIPLE MYELOMA PANEL, SERUM
Albumin SerPl Elph-Mcnc: 3.7 g/dL (ref 2.9–4.4)
Albumin/Glob SerPl: 1.5 (ref 0.7–1.7)
Alpha 1: 0.1 g/dL (ref 0.0–0.4)
Alpha2 Glob SerPl Elph-Mcnc: 0.6 g/dL (ref 0.4–1.0)
B-Globulin SerPl Elph-Mcnc: 1.3 g/dL (ref 0.7–1.3)
Gamma Glob SerPl Elph-Mcnc: 0.6 g/dL (ref 0.4–1.8)
Globulin, Total: 2.6 g/dL (ref 2.2–3.9)
IgA: 331 mg/dL (ref 87–352)
IgG (Immunoglobin G), Serum: 503 mg/dL — ABNORMAL LOW (ref 586–1602)
IgM (Immunoglobulin M), Srm: <5 mg/dL — ABNORMAL LOW (ref 26–217)
M Protein SerPl Elph-Mcnc: 0.2 g/dL — ABNORMAL HIGH
Total Protein ELP: 6.3 g/dL (ref 6.0–8.5)

## 2023-01-24 ENCOUNTER — Inpatient Hospital Stay: Payer: 59

## 2023-01-24 ENCOUNTER — Inpatient Hospital Stay (HOSPITAL_BASED_OUTPATIENT_CLINIC_OR_DEPARTMENT_OTHER): Payer: 59 | Admitting: Physician Assistant

## 2023-01-24 ENCOUNTER — Inpatient Hospital Stay: Payer: 59 | Attending: Nurse Practitioner

## 2023-01-24 ENCOUNTER — Encounter: Payer: Self-pay | Admitting: Physician Assistant

## 2023-01-24 VITALS — BP 136/73 | HR 62 | Temp 98.9°F | Resp 18 | Ht 62.0 in | Wt 170.9 lb

## 2023-01-24 VITALS — BP 136/73 | HR 62 | Temp 98.9°F | Resp 18 | Wt 170.0 lb

## 2023-01-24 DIAGNOSIS — C9002 Multiple myeloma in relapse: Secondary | ICD-10-CM | POA: Diagnosis present

## 2023-01-24 DIAGNOSIS — C9000 Multiple myeloma not having achieved remission: Secondary | ICD-10-CM

## 2023-01-24 DIAGNOSIS — Z5112 Encounter for antineoplastic immunotherapy: Secondary | ICD-10-CM | POA: Insufficient documentation

## 2023-01-24 DIAGNOSIS — E032 Hypothyroidism due to medicaments and other exogenous substances: Secondary | ICD-10-CM | POA: Diagnosis not present

## 2023-01-24 DIAGNOSIS — E876 Hypokalemia: Secondary | ICD-10-CM | POA: Insufficient documentation

## 2023-01-24 LAB — CMP (CANCER CENTER ONLY)
ALT: 9 U/L (ref 0–44)
AST: 12 U/L — ABNORMAL LOW (ref 15–41)
Albumin: 4 g/dL (ref 3.5–5.0)
Alkaline Phosphatase: 57 U/L (ref 38–126)
Anion gap: 8 (ref 5–15)
BUN: 20 mg/dL (ref 8–23)
CO2: 23 mmol/L (ref 22–32)
Calcium: 7.8 mg/dL — ABNORMAL LOW (ref 8.9–10.3)
Chloride: 106 mmol/L (ref 98–111)
Creatinine: 0.87 mg/dL (ref 0.44–1.00)
GFR, Estimated: >60 mL/min (ref >60)
Glucose, Bld: 94 mg/dL (ref 70–99)
Potassium: 3.9 mmol/L (ref 3.5–5.1)
Sodium: 137 mmol/L (ref 135–145)
Total Bilirubin: 0.6 mg/dL (ref 0.3–1.2)
Total Protein: 6.8 g/dL (ref 6.5–8.1)

## 2023-01-24 LAB — CBC WITH DIFFERENTIAL (CANCER CENTER ONLY)
Abs Immature Granulocytes: 0.02 10*3/uL (ref 0.00–0.07)
Basophils Absolute: 0 10*3/uL (ref 0.0–0.1)
Basophils Relative: 1 %
Eosinophils Absolute: 0 10*3/uL (ref 0.0–0.5)
Eosinophils Relative: 1 %
HCT: 30.9 % — ABNORMAL LOW (ref 36.0–46.0)
Hemoglobin: 9.7 g/dL — ABNORMAL LOW (ref 12.0–15.0)
Immature Granulocytes: 1 %
Lymphocytes Relative: 30 %
Lymphs Abs: 1.1 10*3/uL (ref 0.7–4.0)
MCH: 29.8 pg (ref 26.0–34.0)
MCHC: 31.4 g/dL (ref 30.0–36.0)
MCV: 94.8 fL (ref 80.0–100.0)
Monocytes Absolute: 0.3 10*3/uL (ref 0.1–1.0)
Monocytes Relative: 9 %
Neutro Abs: 2.2 10*3/uL (ref 1.7–7.7)
Neutrophils Relative %: 58 %
Platelet Count: 238 10*3/uL (ref 150–400)
RBC: 3.26 MIL/uL — ABNORMAL LOW (ref 3.87–5.11)
RDW: 15.9 % — ABNORMAL HIGH (ref 11.5–15.5)
WBC Count: 3.7 10*3/uL — ABNORMAL LOW (ref 4.0–10.5)
nRBC: 0 % (ref 0.0–0.2)

## 2023-01-24 MED ORDER — DEXTROSE 5 % IV SOLN
56.0000 mg/m2 | Freq: Once | INTRAVENOUS | Status: AC
  Administered 2023-01-24: 100 mg via INTRAVENOUS
  Filled 2023-01-24: qty 30

## 2023-01-24 MED ORDER — FAMOTIDINE IN NACL 20-0.9 MG/50ML-% IV SOLN
20.0000 mg | Freq: Once | INTRAVENOUS | Status: AC
  Administered 2023-01-24: 20 mg via INTRAVENOUS
  Filled 2023-01-24: qty 50

## 2023-01-24 MED ORDER — ACETAMINOPHEN 325 MG PO TABS
650.0000 mg | ORAL_TABLET | Freq: Once | ORAL | Status: AC
  Administered 2023-01-24: 650 mg via ORAL
  Filled 2023-01-24: qty 2

## 2023-01-24 MED ORDER — DIPHENHYDRAMINE HCL 50 MG/ML IJ SOLN
50.0000 mg | Freq: Once | INTRAMUSCULAR | Status: AC
  Administered 2023-01-24: 50 mg via INTRAVENOUS
  Filled 2023-01-24: qty 1

## 2023-01-24 MED ORDER — HEPARIN SOD (PORK) LOCK FLUSH 100 UNIT/ML IV SOLN
500.0000 [IU] | Freq: Once | INTRAVENOUS | Status: AC | PRN
  Administered 2023-01-24: 500 [IU]
  Filled 2023-01-24: qty 5

## 2023-01-24 MED ORDER — SODIUM CHLORIDE 0.9 % IV SOLN
Freq: Once | INTRAVENOUS | Status: AC
  Filled 2023-01-24: qty 250

## 2023-01-24 MED ORDER — SODIUM CHLORIDE 0.9 % IV SOLN
10.0000 mg/kg | Freq: Once | INTRAVENOUS | Status: AC
  Administered 2023-01-24: 800 mg via INTRAVENOUS
  Filled 2023-01-24: qty 25

## 2023-01-24 MED ORDER — SODIUM CHLORIDE 0.9 % IV SOLN
20.0000 mg | Freq: Once | INTRAVENOUS | Status: AC
  Administered 2023-01-24: 20 mg via INTRAVENOUS
  Filled 2023-01-24: qty 2

## 2023-01-24 MED FILL — Dexamethasone Sodium Phosphate Inj 100 MG/10ML: INTRAMUSCULAR | Qty: 2 | Status: AC

## 2023-01-24 NOTE — Progress Notes (Signed)
[facility] PROGRESS NOTE  Patient Care Team: Leanna Sato, MD as PCP - General (Family Medicine) End, Cristal Deer, MD as PCP - Cardiology (Cardiology) Eddie Candle, [name] Earna Coder, MD as Consulting Physician (Hematology and Oncology) Vida Rigger, MD as Consulting Physician (Pulmonary Disease) Tedd Sias Marlana Salvage, MD as Consulting Physician (Endocrinology)   Cancer Staging  No matching staging information was found for the patient.  Oncology History Overview Note  # SEP 2018- MULTIPLE MYELOMA IgALamda [2.5 gm/dl; K/L= 16/1096]; STAGE III [beta 2 microglobulin=5.5] [presented with acute renal failure; anemia; NO hypercalcemia; Skeletal survey-Normal]; BMBx- 45% plasma cells; FISH-POSITIVE 11:14 translocation.[STANDARD-high RISK]/cyto-Normal; SEP 2018- PET- L3 posterior element lesion.   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 No morphologic support for residual myeloma disease. Negative for minimal residual disease by MM-MRD flow cytometry; HOLD REVLIMID [leg swelling]; AUG 2021-PET scan negative for myeloma; continue to hold Revlimid  # MARCH 2021-diastolic congestive heart failure [Dr.End]  # BMBx- OCT 2021- BMBx- 5% plasmacytosis-however this appears to be more polyclonal rather than monoclonal-not explain patient worsening anemia.   # OCT 2022- 18th- Dara- Rev-Dex; MARCH, 2024- PROGRESSION bone marrow biopsy 70%  plasma cells; PET scan no bone lesions-however conus medullaris uptake-MRI lumbar spine-   # MAY 2024- DISCONTINUE carfilzomib-dexamethasone- Pomalyst; [rising M-protein; MAY 2024- 1.3. ]   # start 12/27/2022 -Isa-Carfil-Dex   #November 2021-hyperthyroidism/goiter- [D.Bennett/Dr.Solum]-methimazole. S/p Thyroidectomy [Dr.Kim; [facility]-AUG 2022]  --------------------------  # 12/12- RIGHT JUGULAR DVT-x 51m on xarelto; finished April 2020; September 2020-EGD/dysphagia; Dr. Tobi Bastos  # Acute renal failure [Dr.Singh; Proteinuria 1.5gm/day ]; acyclovir/Asprin ------------------------------------------------------------------------------------------------------------   DIAGNOSIS: [ ]  MULTIPLE MYELOMA  STAGE: III/HIGH RISK ;GOALS: CONTROL      Multiple myeloma not having achieved remission (HCC)  11/21/2017 - 04/28/2018 Chemotherapy   Patient is on Treatment Plan : MYELOMA SALVAGE Cyclophosphamide / Carfilzomib / Dexamethasone (CCd) q28d     05/12/2021 - 02/25/2022 Chemotherapy   Patient is on Treatment Plan : MYELOMA RELAPSED REFRACTORY Daratumumab SQ + Lenalidomide + Dexamethasone (DaraRd) q28d     05/12/2021 - 07/23/2022 Chemotherapy   Patient is on Treatment Plan : MYELOMA RELAPSED REFRACTORY Daratumumab SQ + Lenalidomide + Dexamethasone (DaraRd) q28d     11/02/2022 - 11/16/2022 Chemotherapy   Patient is on Treatment Plan : MYELOMA RELAPSED/ REFRACTORY Carfilzomib D1,8,15 (20/27) + Pomalidomide + Dexamethasone (KPd) q28d     12/27/2022 -  Chemotherapy   Patient is on Treatment Plan : MYELOMA Carfilzomib + Dexamethasone + Isatuximab q28d      INTERVAL HISTORY:  Brandi [name] 72 y.o.  female who presents for continued management of relapsed multiple myeloma. She was last seen by Dr. Donneta Romberg on 01/10/2023. She presents today for Cycle 2, Day 1 of Isa- Carfil-Dex.  Brandi [name] reports she is tolerating her treatment without any significant toxicities. She reports her energy levels are stable. She  does have some fatigue but continues that mainly to her age. She is able to complete all her ADLs on her own. She reports her appetite are  weight are stable. She denies nausea, vomiting or abdominal pain. Her bowel habits are unchanged without recurrent episodes of diarrhea or constipation. She denies any new bone or back pain. She denies easy bruising or signs of active bleeding. She reports some dyspnea with heavy exertion. She denies fevers, chills, sweats, chest pain or cough. She has no other complaints. Rest of the ROS is below.      Review of Systems  Constitutional:  Positive for malaise/fatigue. Negative for chills, diaphoresis and fever.  HENT:  Negative for nosebleeds and sore throat.   Eyes:  Negative for double vision.  Respiratory:  Negative for hemoptysis.   Cardiovascular:  Negative for chest pain, palpitations and orthopnea.  Gastrointestinal:  Negative for abdominal pain, blood in stool, constipation, heartburn and melena.  Genitourinary:  Negative for dysuria, frequency and urgency.  Musculoskeletal:  Positive for back pain.  Skin: Negative.  Negative for itching and rash.  Neurological:  Negative for dizziness, tingling, focal weakness, weakness and headaches.  Endo/Heme/Allergies:  Does not bruise/bleed easily.  Psychiatric/Behavioral:  Negative for depression. The patient is not nervous/anxious.     PAST MEDICAL HISTORY :  Past Medical History:  Diagnosis Date   (HFpEF) heart failure with preserved ejection fraction (HCC)    a. 06/2020 Echo: EF 60-65%, no rwma, nl RV size/fxn. Triv MR. Triv TR/AI. Mod elev PASP.   Anxiety    COPD (chronic obstructive pulmonary disease) (HCC)    GERD (gastroesophageal reflux disease)    Hypertension    Hyperthyroidism    Hypokalemia    IgA myeloma (HCC)    Multiple myeloma (HCC)    Pneumonia    Red blood cell antibody positive, compatible PRBC difficult to obtain    S/P autologous bone marrow transplantation (HCC)     PAST  SURGICAL HISTORY :   Past Surgical History:  Procedure Laterality Date   ANTERIOR VITRECTOMY Left 10/07/2021   Procedure: ANTERIOR VITRECTOMY;  Surgeon: Lockie Mola, MD;  Location: [facility];  Service: Ophthalmology;  Laterality: Left;   CATARACT EXTRACTION W/PHACO Left 10/07/2021   Procedure: CATARACT EXTRACTION PHACO AND INTRAOCULAR LENS PLACEMENT (IOC) LEFT VISION BLUE;  Surgeon: Lockie Mola, MD;  Location: [facility];  Service: Ophthalmology;  Laterality: Left;  kit for manual incision 14.56 01:21.4   CATARACT EXTRACTION W/PHACO Right 10/21/2021   Procedure: CATARACT EXTRACTION PHACO AND INTRAOCULAR LENS PLACEMENT (IOC) RIGHT 11.12 01:48.1;  Surgeon: Lockie Mola, MD;  Location: [facility];  Service: Ophthalmology;  Laterality: Right;   ESOPHAGOGASTRODUODENOSCOPY (EGD) WITH PROPOFOL N/A 03/30/2019   Procedure: ESOPHAGOGASTRODUODENOSCOPY (EGD) WITH PROPOFOL;  Surgeon: Wyline Mood, MD;  [facility];  Service: Gastroenterology;  Laterality: N/A;   IR FLUORO GUIDE PORT INSERTION RIGHT  12/16/2017   IR IMAGING GUIDED PORT INSERTION  06/15/2021   KNEE ARTHROSCOPY Left 09/03/2022   Procedure: ARTHROSCOPY KNEE DEBRIDEMENT;  Surgeon: Lyndle Herrlich, MD;  Location: [facility] ORS;  Service: Orthopedics;  Laterality: Left;   THYROIDECTOMY N/A     FAMILY HISTORY :   Family History  Problem Relation Age of Onset   Pneumonia Mother    Seizures Father     SOCIAL HISTORY:   Social History   Tobacco Use   Smoking status: Former    Packs/day: .25    Types: Cigarettes    Quit date: 03/18/2021    Years since quitting: 1.8   Smokeless tobacco: Never   Tobacco comments:    2 cigarettes QOD  Vaping Use   Vaping Use:  Never used  Substance Use Topics   Alcohol use: No   Drug use: No    ALLERGIES:  has No Known Allergies.  MEDICATIONS:  Current Outpatient Medications  Medication Sig Dispense Refill   acyclovir (ZOVIRAX) 400 MG  tablet Take 1 tablet by mouth twice daily 60 tablet 0   albuterol (VENTOLIN HFA) 108 (90 Base) MCG/ACT inhaler Inhale 2 puffs into the lungs every 6 (six) hours as needed for wheezing or shortness of breath. 8 g 3   aspirin EC 81 MG EC tablet Take 1 tablet (81 mg total) by mouth daily. 90 tablet 3   benzonatate (TESSALON) 100 MG capsule TAKE 1 CAPSULE BY MOUTH TWICE DAILY AS NEEDED FOR COUGH 40 capsule 0   calcium-vitamin D (OSCAL WITH D) 500MG -200UNIT ( ) tablet Take 1 tablet by mouth 2 (two) times daily. 60 tablet 0   carvedilol (COREG) 3.125 MG tablet Take 1 tablet (3.125 mg total) by mouth 2 (two) times daily with a meal. 60 tablet 5   furosemide (LASIX) 40 MG tablet Take 1 tablet (40 mg total) by mouth daily. 90 tablet 3   levothyroxine (SYNTHROID) 100 MCG tablet TAKE 1 TABLET BY MOUTH BEFORE BREAKFAST 90 tablet 0   lidocaine-prilocaine (EMLA) cream Apply 1 application topically as needed. Apply to port and cover with saran wrap 1-2 hours prior to port access 30 g 1   magnesium oxide (MAG-OX) 400 (240 Mg) MG tablet Take 1 tablet (400 mg total) by mouth 2 (two) times daily. 60 tablet 3   methylPREDNISolone (MEDROL DOSEPAK) 4 MG TBPK tablet Use as directed. 21 tablet 1   montelukast (SINGULAIR) 10 MG tablet Take 1 tablet (10 mg total) by mouth at bedtime. 30 tablet 3   ondansetron (ZOFRAN) 8 MG tablet One pill every 8 hours as needed for nausea/vomitting. 40 tablet 1   polyethylene glycol (MIRALAX / GLYCOLAX) packet Take 17 g by mouth daily as needed for mild constipation. 14 each 0   pomalidomide (POMALYST) 4 MG capsule Take 1 capsule (4 mg total) by mouth daily. Take for 21 days, then old for 7 days. Repeat every 28 days. 21 capsule 0   potassium chloride SA (KLOR-CON M) 20 MEQ tablet Take 1 tablet (20 mEq total) by mouth 2 (two) times daily. 60 tablet 6   spironolactone (ALDACTONE) 25 MG tablet Take 1 tablet by mouth once daily 90 tablet 3   traMADol (ULTRAM) 50 MG tablet Take 1 tablet (50  mg total) by mouth every 12 (twelve) hours as needed. 60 tablet 0   Vitamin D, Ergocalciferol, (DRISDOL) 1.25 MG (50000 UNIT) CAPS capsule Take 1 capsule (50,000 Units total) by mouth once a week. 12 capsule 0   pantoprazole (PROTONIX) 40 MG tablet Take 1 tablet (40 mg total) by mouth daily. 90 tablet 0   No current facility-administered medications for this visit.   Facility-Administered Medications Ordered in Other Visits  Medication Dose Route Frequency Provider Last Rate Last Admin   0.9 %  sodium chloride infusion   Intravenous Continuous Ralene Muskrat, PA-C       carfilzomib (KYPROLIS) 100 mg in dextrose 5 % 100 mL chemo infusion  56 mg/m2 (Treatment Plan Recorded) Intravenous Once Earna Coder, MD       dexamethasone (DECADRON) 20 mg in sodium chloride 0.9 % 50 mL IVPB  20 mg Intravenous Once Earna Coder, MD 208 mL/hr at 01/24/23 1046 20 mg at 01/24/23 1046   heparin lock flush 100 unit/mL  500  Units Intracatheter Once PRN Earna Coder, MD       isatuximab-[facility]) 800 mg in sodium chloride 0.9 % 210 mL (3.2 mg/mL) chemo infusion  10 mg/kg (Treatment Plan Recorded) Intravenous Once Earna Coder, MD        PHYSICAL EXAMINATION:   BP 136/73   Pulse 62   Temp 98.9 F (37.2 C) (Tympanic)   Resp 18   Wt 170 lb (77.1 kg)   BMI 31.09 kg/m   Filed Weights   01/24/23 0941  Weight: 170 lb (77.1 kg)    Physical Exam HENT:     Head: Normocephalic and atraumatic.  Eyes:     Pupils: Pupils are equal, round, and reactive to light.  Cardiovascular:     Rate and Rhythm: Normal rate and regular rhythm.  Pulmonary:     Effort: Pulmonary effort is normal. No respiratory distress.     Breath sounds: Normal breath sounds. No wheezing.  Abdominal:     General: Bowel sounds are normal.     Palpations: Abdomen is soft.  Musculoskeletal:        General: No tenderness. Normal range of motion.     Cervical back: Normal range of motion.  Skin:     General: Skin is warm.  Neurological:     Mental Status: She is alert and oriented to person, place, and time.  Psychiatric:        Mood and Affect: Mood and affect normal.        Behavior: Behavior normal.    LABORATORY DATA:  I have reviewed the data as listed    Component Value Date/Time   NA 137 01/24/2023 0908   NA 141 01/25/2020 1530   K 3.9 01/24/2023 0908   CL 106 01/24/2023 0908   CO2 23 01/24/2023 0908   GLUCOSE 94 01/24/2023 0908   BUN 20 01/24/2023 0908   BUN 19 01/25/2020 1530   CREATININE 0.87 01/24/2023 0908   CALCIUM 7.8 (L) 01/24/2023 0908   PROT 6.8 01/24/2023 0908   ALBUMIN 4.0 01/24/2023 0908   AST 12 (L) 01/24/2023 0908   ALT 9 01/24/2023 0908   ALKPHOS 57 01/24/2023 0908   BILITOT 0.6 01/24/2023 0908   GFRNONAA >60 01/24/2023 0908   GFRAA NOT CALCULATED 04/17/2020 1352    No results found for: "SPEP", "UPEP"  Lab Results  Component Value Date   WBC 3.7 (L) 01/24/2023   NEUTROABS 2.2 01/24/2023   HGB 9.7 (L) 01/24/2023   HCT 30.9 (L) 01/24/2023   MCV 94.8 01/24/2023   PLT 238 01/24/2023      Chemistry      Component Value Date/Time   NA 137 01/24/2023 0908   NA 141 01/25/2020 1530   K 3.9 01/24/2023 0908   CL 106 01/24/2023 0908   CO2 23 01/24/2023 0908   BUN 20 01/24/2023 0908   BUN 19 01/25/2020 1530   CREATININE 0.87 01/24/2023 0908      Component Value Date/Time   CALCIUM 7.8 (L) 01/24/2023 0908   ALKPHOS 57 01/24/2023 0908   AST 12 (L) 01/24/2023 0908   ALT 9 01/24/2023 0908   BILITOT 0.6 01/24/2023 0908      Latest Reference Range & Units 01/17/23 08:47  IgG (Immunoglobin G), Serum 586 - 1,602 mg/dL 161 (L)  IgM (Immunoglobulin M), Srm 26 - 217 mg/dL <5 (L)  IgA 87 - 096 mg/dL 045  Kappa free light chain 3.3 - 19.4 mg/L 1.3 (L)  Lambda free light  chains 5.7 - 26.3 mg/L 18.7  Kappa, lambda light chain ratio 0.26 - 1.65  0.07 (L)     RADIOGRAPHIC STUDIES: I have personally reviewed the radiological images as listed  and agreed with the findings in the report. No results found.   ASSESSMENT & PLAN:  Brandi [name] is a 72 y.o. female who presents for a follow up for continued management of multiple myeloma.   #Multiple myeloma not having achieved remission, stage III [high-risk cytogenetics-status post KRD-VGPR ; status post autologous stem cell transplant on 06/15/18.   --Most recently progressed on salvage therapy with daratumumab  lenalidomide [15 mg 3 w; 1-w] dexamethasone. plasma cell myeloma by approximately 70% of the overall cellular marrow;   --FEB 2024-lambda light chain 284 Bone marrow-70% plasma cells. M Protein 0.9-rising lambda light chain ratio. Discontinue Revlimid; and dara-given the progressive disease.   --MARCH 2024- PET scan: No skeletal lesions noted.  --APRIl 2024- MRI-cervical/thoracic/lumbar spine-negative for any lesions in the spinal cord. DISCONTINUE Pomalyst;- carfilzomib-dexamethasone- [rising M-protein; MAY 2024- 1.3. ] --Started new regimen with Isa-Carfil-Dex on 12/27/2022.  PLAN: -Due for Cycle 2, Day 1 of Isa/Carfil/Dex today -Labs from today were reviewed and adequate for treatment. WBC 3.7, Hgb 9.7, Plt 238K, Creatinine and LFTs normal.  -Reviewed most recent myeloma labs from 01/16/2022 that showed improvement of M-protein from 1.5 to 0.2. Lambda light chains improved from 307.6 to 18.7.  -Proceed with treatment today without any dose modifications -Continue with weekly labs and treatment. RTC in 4 weeks prior to Cycle 3, Day 1 for a toxicity check.  -Need to repeat echo after Cycle 3.   #Frequent sinus/acute bronchitis infections: --Improved with antibiotic therapy.  --Hospitalized from septic arthritis of left knee in Feb 2024 ---IgG levels less than 400.s/p with IVIG infusions 400 mg/kg x2  --No infectous symptoms today, monitor for now.   #Hypokalemia/hypocalcemia: --Potassium level is 3.9, calcium level 7.8 today --Continue on Potassium chloride 20 mEq  BID --Continue Ca+Vita D supplementation BID.   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  --Need to repeat TSH levels at next visit.   # Lower back discomfort x 6 months:   --MRI lumbar spine from 10/12/2022 showed no new suspicious osseous lesions. There is multilevel degenerative changes. -- Continue tramadol as needed    # Bone lesions /last zometa on 11/27/2017.     --Dental extraction appointment appt-on Jan 26th.   --PET scan from 09/30/2022 showed no evidence of any bone lesions. --Hold Zometa for now.     DISPOSITION: -1 week- labs- chemo; D-2 chemo; -2 weeks- labs-:MM panel; K/l light chain ratio; chemo; D-2 chemo  -4 weeks- MD visit (Dr. B), labs- chemo; D-2 chemo -5 weeks-labs- chemo; D-2 chemo; -6 weeks:  labs-:MM panel; K/l light chain ratio; chemo; D-2 chemo   All questions were answered. The patient knows to call the clinic with any problems, questions or concerns.   I have spent a total of 30 minutes minutes of face-to-face and non-face-to-face time, preparing to see the patient, performing a medically appropriate examination, counseling and educating the patient, documenting clinical information in the electronic health record,  and care coordination.   Georga Kaufmann PA-C Dept of Hematology and Oncology [facility]

## 2023-01-24 NOTE — Patient Instructions (Signed)
[facility] [place] [facility]  Discharge Instructions: Thank you for choosing [facility] to provide your oncology and hematology care.  If you have a lab appointment with the Cancer Center, please go directly to the Cancer Center and check in at the registration area.  Wear comfortable clothing and clothing appropriate for easy access to any Portacath or PICC line.   We strive to give you quality time with your provider. You may need to reschedule your appointment if you arrive late (15 or more minutes).  Arriving late affects you and other patients whose appointments are after yours.  Also, if you miss three or more appointments without notifying the office, you may be dismissed from the clinic at the provider's discretion.      For prescription refill requests, have your pharmacy contact our office and allow 72 hours for refills to be completed.     To help prevent nausea and vomiting after your treatment, we encourage you to take your nausea medication as directed.  BELOW ARE SYMPTOMS THAT SHOULD BE REPORTED IMMEDIATELY: *FEVER GREATER THAN 100.4 F (38 C) OR HIGHER *CHILLS OR SWEATING *NAUSEA AND VOMITING THAT IS NOT CONTROLLED WITH YOUR NAUSEA MEDICATION *UNUSUAL SHORTNESS OF BREATH *UNUSUAL BRUISING OR BLEEDING *URINARY PROBLEMS (pain or burning when urinating, or frequent urination) *BOWEL PROBLEMS (unusual diarrhea, constipation, pain near the anus) TENDERNESS IN MOUTH AND THROAT WITH OR WITHOUT PRESENCE OF ULCERS (sore throat, sores in mouth, or a toothache) UNUSUAL RASH, SWELLING OR PAIN  UNUSUAL VAGINAL DISCHARGE OR ITCHING   Items with * indicate a potential emergency and should be followed up as soon as possible or go to the Emergency Department if any problems should occur.  Please show the CHEMOTHERAPY ALERT CARD or IMMUNOTHERAPY ALERT CARD at check-in to the Emergency Department and triage nurse.  Should you have questions after your visit  or need to cancel or reschedule your appointment, please contact [facility] [place] [facility]  [phone] and follow the prompts.  Office hours are 8:00 a.m. to 4:30 p.m. Monday - Friday. Please note that voicemails left after 4:00 p.m. may not be returned until the following business day.  We are closed weekends and major holidays. You have access to a nurse at all times for urgent questions. Please call the main number to the clinic [phone] and follow the prompts.  For any non-urgent questions, you may also contact your provider using MyChart. We now offer e-Visits for anyone 18 and older to request care online for non-urgent symptoms. For details visit [email or web address].   Also download the MyChart app! Go to the app store, search "MyChart", open the app, select [facility], and log in with your MyChart username and password.    

## 2023-01-25 ENCOUNTER — Ambulatory Visit: Payer: 59

## 2023-01-25 ENCOUNTER — Other Ambulatory Visit: Payer: Self-pay | Admitting: *Deleted

## 2023-01-25 ENCOUNTER — Inpatient Hospital Stay: Payer: 59

## 2023-01-25 VITALS — BP 137/71 | HR 55 | Temp 97.8°F | Resp 19

## 2023-01-25 DIAGNOSIS — C9000 Multiple myeloma not having achieved remission: Secondary | ICD-10-CM

## 2023-01-25 DIAGNOSIS — Z5112 Encounter for antineoplastic immunotherapy: Secondary | ICD-10-CM | POA: Diagnosis not present

## 2023-01-25 MED ORDER — DEXTROSE 5 % IV SOLN
56.0000 mg/m2 | Freq: Once | INTRAVENOUS | Status: AC
  Administered 2023-01-25: 100 mg via INTRAVENOUS
  Filled 2023-01-25: qty 30

## 2023-01-25 MED ORDER — SODIUM CHLORIDE 0.9 % IV SOLN
Freq: Once | INTRAVENOUS | Status: AC
  Filled 2023-01-25: qty 250

## 2023-01-25 MED ORDER — SODIUM CHLORIDE 0.9 % IV SOLN
20.0000 mg | Freq: Once | INTRAVENOUS | Status: AC
  Administered 2023-01-25: 20 mg via INTRAVENOUS
  Filled 2023-01-25: qty 20

## 2023-01-25 MED ORDER — HEPARIN SOD (PORK) LOCK FLUSH 100 UNIT/ML IV SOLN
500.0000 [IU] | Freq: Once | INTRAVENOUS | Status: AC | PRN
  Administered 2023-01-25: 500 [IU]
  Filled 2023-01-25: qty 5

## 2023-01-25 NOTE — Patient Instructions (Signed)
[facility] [place] [facility]  Discharge Instructions: Thank you for choosing [facility] to provide your oncology and hematology care.  If you have a lab appointment with the Cancer Center, please go directly to the Cancer Center and check in at the registration area.  Wear comfortable clothing and clothing appropriate for easy access to any Portacath or PICC line.   We strive to give you quality time with your provider. You may need to reschedule your appointment if you arrive late (15 or more minutes).  Arriving late affects you and other patients whose appointments are after yours.  Also, if you miss three or more appointments without notifying the office, you may be dismissed from the clinic at the provider's discretion.      For prescription refill requests, have your pharmacy contact our office and allow 72 hours for refills to be completed.    Today you received the following chemotherapy and/or immunotherapy agents- kyprolis      To help prevent nausea and vomiting after your treatment, we encourage you to take your nausea medication as directed.  BELOW ARE SYMPTOMS THAT SHOULD BE REPORTED IMMEDIATELY: *FEVER GREATER THAN 100.4 F (38 C) OR HIGHER *CHILLS OR SWEATING *NAUSEA AND VOMITING THAT IS NOT CONTROLLED WITH YOUR NAUSEA MEDICATION *UNUSUAL SHORTNESS OF BREATH *UNUSUAL BRUISING OR BLEEDING *URINARY PROBLEMS (pain or burning when urinating, or frequent urination) *BOWEL PROBLEMS (unusual diarrhea, constipation, pain near the anus) TENDERNESS IN MOUTH AND THROAT WITH OR WITHOUT PRESENCE OF ULCERS (sore throat, sores in mouth, or a toothache) UNUSUAL RASH, SWELLING OR PAIN  UNUSUAL VAGINAL DISCHARGE OR ITCHING   Items with * indicate a potential emergency and should be followed up as soon as possible or go to the Emergency Department if any problems should occur.  Please show the CHEMOTHERAPY ALERT CARD or IMMUNOTHERAPY ALERT CARD at check-in to  the Emergency Department and triage nurse.  Should you have questions after your visit or need to cancel or reschedule your appointment, please contact [facility] [place] [facility]  [phone] and follow the prompts.  Office hours are 8:00 a.m. to 4:30 p.m. Monday - Friday. Please note that voicemails left after 4:00 p.m. may not be returned until the following business day.  We are closed weekends and major holidays. You have access to a nurse at all times for urgent questions. Please call the main number to the clinic [phone] and follow the prompts.  For any non-urgent questions, you may also contact your provider using MyChart. We now offer e-Visits for anyone 18 and older to request care online for non-urgent symptoms. For details visit [email or web address].   Also download the MyChart app! Go to the app store, search "MyChart", open the app, select [facility], and log in with your MyChart username and password.    

## 2023-01-31 ENCOUNTER — Other Ambulatory Visit: Payer: Self-pay | Admitting: Nurse Practitioner

## 2023-01-31 ENCOUNTER — Telehealth: Payer: Self-pay

## 2023-01-31 ENCOUNTER — Inpatient Hospital Stay: Payer: 59

## 2023-01-31 DIAGNOSIS — C9000 Multiple myeloma not having achieved remission: Secondary | ICD-10-CM

## 2023-01-31 NOTE — Progress Notes (Signed)
Patient arrived late for appointment. D8C2 deferred to 7/10. D9C2 deferred to 7/11. Patient will have lab and chemo on same day.

## 2023-01-31 NOTE — Telephone Encounter (Signed)
Spoke with patient to verify patient taking supplementation for Calcium and Vitamin D. Per patient she takes Tums for calcium but unsure the dosing, and Vitamin D daily also unsure of dosing seeing she is not at home. Advised per Brandi [name] that patient needs to start taking 2000 of calcium daily and 1-2,000 of Vitamin D daily. Patient verbalized understanding and will start new dosing.

## 2023-01-31 NOTE — Telephone Encounter (Signed)
-----   Message from Alinda Dooms, NP sent at 01/31/2023  2:50 PM EDT ----- Please check that patient is taking oral calcium and vitamin d supplements. If so, how much and often. Will need to increase.   ----- Message ----- From: Leory Plowman, Lab In [place] Sent: 01/24/2023   9:29 AM EDT To: Earna Coder, MD

## 2023-02-01 ENCOUNTER — Inpatient Hospital Stay: Payer: 59

## 2023-02-01 ENCOUNTER — Other Ambulatory Visit: Payer: Self-pay

## 2023-02-01 MED FILL — Dexamethasone Sodium Phosphate Inj 100 MG/10ML: INTRAMUSCULAR | Qty: 2 | Status: AC

## 2023-02-02 ENCOUNTER — Inpatient Hospital Stay: Payer: 59

## 2023-02-02 VITALS — BP 149/86 | HR 67 | Temp 97.3°F | Resp 18 | Wt 170.2 lb

## 2023-02-02 DIAGNOSIS — Z5112 Encounter for antineoplastic immunotherapy: Secondary | ICD-10-CM | POA: Diagnosis not present

## 2023-02-02 DIAGNOSIS — C9000 Multiple myeloma not having achieved remission: Secondary | ICD-10-CM

## 2023-02-02 DIAGNOSIS — E876 Hypokalemia: Secondary | ICD-10-CM

## 2023-02-02 LAB — CBC WITH DIFFERENTIAL (CANCER CENTER ONLY)
Abs Immature Granulocytes: 0 10*3/uL (ref 0.00–0.07)
Basophils Absolute: 0 10*3/uL (ref 0.0–0.1)
Basophils Relative: 0 %
Eosinophils Absolute: 0 10*3/uL (ref 0.0–0.5)
Eosinophils Relative: 1 %
HCT: 31.3 % — ABNORMAL LOW (ref 36.0–46.0)
Hemoglobin: 9.8 g/dL — ABNORMAL LOW (ref 12.0–15.0)
Immature Granulocytes: 0 %
Lymphocytes Relative: 27 %
Lymphs Abs: 0.9 10*3/uL (ref 0.7–4.0)
MCH: 30 pg (ref 26.0–34.0)
MCHC: 31.3 g/dL (ref 30.0–36.0)
MCV: 95.7 fL (ref 80.0–100.0)
Monocytes Absolute: 0.3 10*3/uL (ref 0.1–1.0)
Monocytes Relative: 10 %
Neutro Abs: 2 10*3/uL (ref 1.7–7.7)
Neutrophils Relative %: 62 %
Platelet Count: 113 10*3/uL — ABNORMAL LOW (ref 150–400)
RBC: 3.27 MIL/uL — ABNORMAL LOW (ref 3.87–5.11)
RDW: 16.1 % — ABNORMAL HIGH (ref 11.5–15.5)
WBC Count: 3.3 10*3/uL — ABNORMAL LOW (ref 4.0–10.5)
nRBC: 0 % (ref 0.0–0.2)

## 2023-02-02 LAB — CMP (CANCER CENTER ONLY)
ALT: 10 U/L (ref 0–44)
AST: 17 U/L (ref 15–41)
Albumin: 4 g/dL (ref 3.5–5.0)
Alkaline Phosphatase: 59 U/L (ref 38–126)
Anion gap: 12 (ref 5–15)
BUN: 16 mg/dL (ref 8–23)
CO2: 20 mmol/L — ABNORMAL LOW (ref 22–32)
Calcium: 8.3 mg/dL — ABNORMAL LOW (ref 8.9–10.3)
Chloride: 108 mmol/L (ref 98–111)
Creatinine: 0.8 mg/dL (ref 0.44–1.00)
GFR, Estimated: >60 mL/min (ref >60)
Glucose, Bld: 108 mg/dL — ABNORMAL HIGH (ref 70–99)
Potassium: 3.1 mmol/L — ABNORMAL LOW (ref 3.5–5.1)
Sodium: 140 mmol/L (ref 135–145)
Total Bilirubin: 0.7 mg/dL (ref 0.3–1.2)
Total Protein: 6.8 g/dL (ref 6.5–8.1)

## 2023-02-02 MED ORDER — SODIUM CHLORIDE 0.9 % IV SOLN
Freq: Once | INTRAVENOUS | Status: AC
  Filled 2023-02-02: qty 250

## 2023-02-02 MED ORDER — POTASSIUM CHLORIDE IN NACL 20-0.9 MEQ/L-% IV SOLN
Freq: Once | INTRAVENOUS | Status: AC
  Filled 2023-02-02: qty 1000

## 2023-02-02 MED ORDER — DEXTROSE 5 % IV SOLN
56.0000 mg/m2 | Freq: Once | INTRAVENOUS | Status: AC
  Administered 2023-02-02: 100 mg via INTRAVENOUS
  Filled 2023-02-02: qty 30

## 2023-02-02 MED ORDER — HEPARIN SOD (PORK) LOCK FLUSH 100 UNIT/ML IV SOLN
500.0000 [IU] | Freq: Once | INTRAVENOUS | Status: AC | PRN
  Administered 2023-02-02: 500 [IU]
  Filled 2023-02-02: qty 5

## 2023-02-02 MED ORDER — SODIUM CHLORIDE 0.9 % IV SOLN
20.0000 mg | Freq: Once | INTRAVENOUS | Status: AC
  Administered 2023-02-02: 20 mg via INTRAVENOUS
  Filled 2023-02-02: qty 20
  Filled 2023-02-02: qty 2

## 2023-02-02 MED FILL — Dexamethasone Sodium Phosphate Inj 100 MG/10ML: INTRAMUSCULAR | Qty: 2 | Status: AC

## 2023-02-02 NOTE — Progress Notes (Signed)
Patients potassium today was 3.1. Notified Rao, MD and 20 meq of K over an hour was ordered. Verified with Smith Robert, MD ok to run 1 L bag over an hour at 984ml/hr. Pt received treatment and was stable at discharge.

## 2023-02-02 NOTE — Patient Instructions (Addendum)
[facility] [place] [facility]  Discharge Instructions: Thank you for choosing [facility] to provide your oncology and hematology care.  If you have a lab appointment with the Cancer Center, please go directly to the Cancer Center and check in at the registration area.  Wear comfortable clothing and clothing appropriate for easy access to any Portacath or PICC line.   We strive to give you quality time with your provider. You may need to reschedule your appointment if you arrive late (15 or more minutes).  Arriving late affects you and other patients whose appointments are after yours.  Also, if you miss three or more appointments without notifying the office, you may be dismissed from the clinic at the provider's discretion.      For prescription refill requests, have your pharmacy contact our office and allow 72 hours for refills to be completed.    Today you received the following chemotherapy and/or immunotherapy agents- kyprolis      To help prevent nausea and vomiting after your treatment, we encourage you to take your nausea medication as directed.  BELOW ARE SYMPTOMS THAT SHOULD BE REPORTED IMMEDIATELY: *FEVER GREATER THAN 100.4 F (38 C) OR HIGHER *CHILLS OR SWEATING *NAUSEA AND VOMITING THAT IS NOT CONTROLLED WITH YOUR NAUSEA MEDICATION *UNUSUAL SHORTNESS OF BREATH *UNUSUAL BRUISING OR BLEEDING *URINARY PROBLEMS (pain or burning when urinating, or frequent urination) *BOWEL PROBLEMS (unusual diarrhea, constipation, pain near the anus) TENDERNESS IN MOUTH AND THROAT WITH OR WITHOUT PRESENCE OF ULCERS (sore throat, sores in mouth, or a toothache) UNUSUAL RASH, SWELLING OR PAIN  UNUSUAL VAGINAL DISCHARGE OR ITCHING   Items with * indicate a potential emergency and should be followed up as soon as possible or go to the Emergency Department if any problems should occur.  Please show the CHEMOTHERAPY ALERT CARD or IMMUNOTHERAPY ALERT CARD at check-in to  the Emergency Department and triage nurse.  Should you have questions after your visit or need to cancel or reschedule your appointment, please contact [facility] [place] [facility]  [phone] and follow the prompts.  Office hours are 8:00 a.m. to 4:30 p.m. Monday - Friday. Please note that voicemails left after 4:00 p.m. may not be returned until the following business day.  We are closed weekends and major holidays. You have access to a nurse at all times for urgent questions. Please call the main number to the clinic [phone] and follow the prompts.  For any non-urgent questions, you may also contact your provider using MyChart. We now offer e-Visits for anyone 18 and older to request care online for non-urgent symptoms. For details visit [email or web address].   Also download the MyChart app! Go to the app store, search "MyChart", open the app, select [place], and log in with your MyChart username and password.    

## 2023-02-03 ENCOUNTER — Inpatient Hospital Stay: Payer: 59

## 2023-02-03 VITALS — BP 139/74 | HR 55 | Temp 98.4°F | Resp 18

## 2023-02-03 DIAGNOSIS — C9000 Multiple myeloma not having achieved remission: Secondary | ICD-10-CM

## 2023-02-03 DIAGNOSIS — Z5112 Encounter for antineoplastic immunotherapy: Secondary | ICD-10-CM | POA: Diagnosis not present

## 2023-02-03 LAB — KAPPA/LAMBDA LIGHT CHAINS
Kappa free light chain: 1.3 mg/L — ABNORMAL LOW (ref 3.3–19.4)
Kappa, lambda light chain ratio: 0.12 — ABNORMAL LOW (ref 0.26–1.65)
Lambda free light chains: 10.6 mg/L (ref 5.7–26.3)

## 2023-02-03 MED ORDER — HEPARIN SOD (PORK) LOCK FLUSH 100 UNIT/ML IV SOLN
500.0000 [IU] | Freq: Once | INTRAVENOUS | Status: AC | PRN
  Administered 2023-02-03: 500 [IU]
  Filled 2023-02-03: qty 5

## 2023-02-03 MED ORDER — SODIUM CHLORIDE 0.9 % IV SOLN
Freq: Once | INTRAVENOUS | Status: AC
  Filled 2023-02-03: qty 250

## 2023-02-03 MED ORDER — SODIUM CHLORIDE 0.9% FLUSH
10.0000 mL | INTRAVENOUS | Status: DC | PRN
  Administered 2023-02-03: 10 mL
  Filled 2023-02-03: qty 10

## 2023-02-03 MED ORDER — SODIUM CHLORIDE 0.9 % IV SOLN
20.0000 mg | Freq: Once | INTRAVENOUS | Status: AC
  Administered 2023-02-03: 20 mg via INTRAVENOUS
  Filled 2023-02-03: qty 20

## 2023-02-03 MED ORDER — DEXTROSE 5 % IV SOLN
56.0000 mg/m2 | Freq: Once | INTRAVENOUS | Status: AC
  Administered 2023-02-03: 100 mg via INTRAVENOUS
  Filled 2023-02-03: qty 30

## 2023-02-03 NOTE — Patient Instructions (Signed)
[facility] [employer] [facility]  Discharge Instructions: Thank you for choosing [facility] to provide your oncology and hematology care.  If you have a lab appointment with the Cancer Center, please go directly to the Cancer Center and check in at the registration area.  Wear comfortable clothing and clothing appropriate for easy access to any Portacath or PICC line.   We strive to give you quality time with your provider. You may need to reschedule your appointment if you arrive late (15 or more minutes).  Arriving late affects you and other patients whose appointments are after yours.  Also, if you miss three or more appointments without notifying the office, you may be dismissed from the clinic at the provider's discretion.      For prescription refill requests, have your pharmacy contact our office and allow 72 hours for refills to be completed.    Today you received the following chemotherapy and/or immunotherapy agents- kyprolis      To help prevent nausea and vomiting after your treatment, we encourage you to take your nausea medication as directed.  BELOW ARE SYMPTOMS THAT SHOULD BE REPORTED IMMEDIATELY: *FEVER GREATER THAN 100.4 F (38 C) OR HIGHER *CHILLS OR SWEATING *NAUSEA AND VOMITING THAT IS NOT CONTROLLED WITH YOUR NAUSEA MEDICATION *UNUSUAL SHORTNESS OF BREATH *UNUSUAL BRUISING OR BLEEDING *URINARY PROBLEMS (pain or burning when urinating, or frequent urination) *BOWEL PROBLEMS (unusual diarrhea, constipation, pain near the anus) TENDERNESS IN MOUTH AND THROAT WITH OR WITHOUT PRESENCE OF ULCERS (sore throat, sores in mouth, or a toothache) UNUSUAL RASH, SWELLING OR PAIN  UNUSUAL VAGINAL DISCHARGE OR ITCHING   Items with * indicate a potential emergency and should be followed up as soon as possible or go to the Emergency Department if any problems should occur.  Please show the CHEMOTHERAPY ALERT CARD or IMMUNOTHERAPY ALERT CARD at check-in to  the Emergency Department and triage nurse.  Should you have questions after your visit or need to cancel or reschedule your appointment, please contact [name] [place] [facility]  [phone] and follow the prompts.  Office hours are 8:00 a.m. to 4:30 p.m. Monday - Friday. Please note that voicemails left after 4:00 p.m. may not be returned until the following business day.  We are closed weekends and major holidays. You have access to a nurse at all times for urgent questions. Please call the main number to the clinic [phone] and follow the prompts.  For any non-urgent questions, you may also contact your provider using MyChart. We now offer e-Visits for anyone 18 and older to request care online for non-urgent symptoms. For details visit [email or web address].   Also download the MyChart app! Go to the app store, search "MyChart", open the app, select [place], and log in with your MyChart username and password.    

## 2023-02-07 ENCOUNTER — Ambulatory Visit: Payer: 59

## 2023-02-07 ENCOUNTER — Other Ambulatory Visit: Payer: 59

## 2023-02-07 LAB — MULTIPLE MYELOMA PANEL, SERUM
Albumin SerPl Elph-Mcnc: 3.5 g/dL (ref 2.9–4.4)
Albumin/Glob SerPl: 1.4 (ref 0.7–1.7)
Alpha 1: 0.1 g/dL (ref 0.0–0.4)
Alpha2 Glob SerPl Elph-Mcnc: 0.7 g/dL (ref 0.4–1.0)
B-Globulin SerPl Elph-Mcnc: 1.3 g/dL (ref 0.7–1.3)
Gamma Glob SerPl Elph-Mcnc: 0.4 g/dL (ref 0.4–1.8)
Globulin, Total: 2.6 g/dL (ref 2.2–3.9)
IgA: 153 mg/dL (ref 64–422)
IgG (Immunoglobin G), Serum: 481 mg/dL — ABNORMAL LOW (ref 586–1602)
IgM (Immunoglobulin M), Srm: 6 mg/dL — ABNORMAL LOW (ref 26–217)
M Protein SerPl Elph-Mcnc: 0.4 g/dL — ABNORMAL HIGH
Total Protein ELP: 6.1 g/dL (ref 6.0–8.5)

## 2023-02-08 ENCOUNTER — Ambulatory Visit: Payer: 59

## 2023-02-09 ENCOUNTER — Inpatient Hospital Stay: Payer: 59

## 2023-02-09 VITALS — BP 123/75 | HR 60 | Temp 98.6°F

## 2023-02-09 DIAGNOSIS — C9000 Multiple myeloma not having achieved remission: Secondary | ICD-10-CM

## 2023-02-09 DIAGNOSIS — Z5112 Encounter for antineoplastic immunotherapy: Secondary | ICD-10-CM | POA: Diagnosis not present

## 2023-02-09 LAB — CMP (CANCER CENTER ONLY)
ALT: 10 U/L (ref 0–44)
AST: 13 U/L — ABNORMAL LOW (ref 15–41)
Albumin: 3.7 g/dL (ref 3.5–5.0)
Alkaline Phosphatase: 58 U/L (ref 38–126)
Anion gap: 8 (ref 5–15)
BUN: 18 mg/dL (ref 8–23)
CO2: 21 mmol/L — ABNORMAL LOW (ref 22–32)
Calcium: 7.7 mg/dL — ABNORMAL LOW (ref 8.9–10.3)
Chloride: 109 mmol/L (ref 98–111)
Creatinine: 0.84 mg/dL (ref 0.44–1.00)
GFR, Estimated: >60 mL/min (ref >60)
Glucose, Bld: 90 mg/dL (ref 70–99)
Potassium: 3.3 mmol/L — ABNORMAL LOW (ref 3.5–5.1)
Sodium: 138 mmol/L (ref 135–145)
Total Bilirubin: 0.7 mg/dL (ref 0.3–1.2)
Total Protein: 6.3 g/dL — ABNORMAL LOW (ref 6.5–8.1)

## 2023-02-09 LAB — CBC WITH DIFFERENTIAL (CANCER CENTER ONLY)
Abs Immature Granulocytes: 0.02 10*3/uL (ref 0.00–0.07)
Basophils Absolute: 0 10*3/uL (ref 0.0–0.1)
Basophils Relative: 0 %
Eosinophils Absolute: 0 10*3/uL (ref 0.0–0.5)
Eosinophils Relative: 0 %
HCT: 29.8 % — ABNORMAL LOW (ref 36.0–46.0)
Hemoglobin: 9.4 g/dL — ABNORMAL LOW (ref 12.0–15.0)
Immature Granulocytes: 1 %
Lymphocytes Relative: 33 %
Lymphs Abs: 1.3 10*3/uL (ref 0.7–4.0)
MCH: 29.7 pg (ref 26.0–34.0)
MCHC: 31.5 g/dL (ref 30.0–36.0)
MCV: 94 fL (ref 80.0–100.0)
Monocytes Absolute: 0.3 10*3/uL (ref 0.1–1.0)
Monocytes Relative: 8 %
Neutro Abs: 2.3 10*3/uL (ref 1.7–7.7)
Neutrophils Relative %: 58 %
Platelet Count: 85 10*3/uL — ABNORMAL LOW (ref 150–400)
RBC: 3.17 MIL/uL — ABNORMAL LOW (ref 3.87–5.11)
RDW: 16.4 % — ABNORMAL HIGH (ref 11.5–15.5)
WBC Count: 4 10*3/uL (ref 4.0–10.5)
nRBC: 0 % (ref 0.0–0.2)

## 2023-02-09 MED ORDER — SODIUM CHLORIDE 0.9 % IV SOLN
10.0000 mg/kg | Freq: Once | INTRAVENOUS | Status: AC
  Administered 2023-02-09: 800 mg via INTRAVENOUS
  Filled 2023-02-09: qty 15

## 2023-02-09 MED ORDER — DEXTROSE 5 % IV SOLN
56.0000 mg/m2 | Freq: Once | INTRAVENOUS | Status: AC
  Administered 2023-02-09: 100 mg via INTRAVENOUS
  Filled 2023-02-09: qty 30

## 2023-02-09 MED ORDER — FAMOTIDINE IN NACL 20-0.9 MG/50ML-% IV SOLN
20.0000 mg | Freq: Once | INTRAVENOUS | Status: AC
  Administered 2023-02-09: 20 mg via INTRAVENOUS

## 2023-02-09 MED ORDER — SODIUM CHLORIDE 0.9 % IV SOLN
20.0000 mg | Freq: Once | INTRAVENOUS | Status: AC
  Administered 2023-02-09: 20 mg via INTRAVENOUS
  Filled 2023-02-09: qty 20

## 2023-02-09 MED ORDER — SODIUM CHLORIDE 0.9% FLUSH
10.0000 mL | INTRAVENOUS | Status: DC | PRN
  Administered 2023-02-09: 10 mL
  Filled 2023-02-09: qty 10

## 2023-02-09 MED ORDER — ACETAMINOPHEN 325 MG PO TABS
650.0000 mg | ORAL_TABLET | Freq: Once | ORAL | Status: AC
  Administered 2023-02-09: 650 mg via ORAL
  Filled 2023-02-09: qty 2

## 2023-02-09 MED ORDER — SODIUM CHLORIDE 0.9 % IV SOLN
Freq: Once | INTRAVENOUS | Status: AC
  Filled 2023-02-09: qty 250

## 2023-02-09 MED ORDER — DIPHENHYDRAMINE HCL 50 MG/ML IJ SOLN
50.0000 mg | Freq: Once | INTRAMUSCULAR | Status: AC
  Administered 2023-02-09: 50 mg via INTRAVENOUS
  Filled 2023-02-09: qty 1

## 2023-02-09 MED ORDER — HEPARIN SOD (PORK) LOCK FLUSH 100 UNIT/ML IV SOLN
500.0000 [IU] | Freq: Once | INTRAVENOUS | Status: AC | PRN
  Administered 2023-02-09: 500 [IU]
  Filled 2023-02-09: qty 5

## 2023-02-09 NOTE — Progress Notes (Signed)
Tolerated carfilzomib & isatuximab well today. Port left accessed for apt tomorrow 7/18

## 2023-02-09 NOTE — Patient Instructions (Signed)
[facility]  Discharge Instructions: Thank you for choosing [facility] to provide your oncology and hematology care.  If you have a lab appointment with the Cancer Center, please go directly to the Cancer Center and check in at the registration area.  Wear comfortable clothing and clothing appropriate for easy access to any Portacath or PICC line.   We strive to give you quality time with your provider. You may need to reschedule your appointment if you arrive late (15 or more minutes).  Arriving late affects you and other patients whose appointments are after yours.  Also, if you miss three or more appointments without notifying the office, you may be dismissed from the clinic at the provider's discretion.      For prescription refill requests, have your pharmacy contact our office and allow 72 hours for refills to be completed.    Today you received the following chemotherapy and/or immunotherapy agents: carfilzomib/ isatuximab    To help prevent nausea and vomiting after your treatment, we encourage you to take your nausea medication as directed.  BELOW ARE SYMPTOMS THAT SHOULD BE REPORTED IMMEDIATELY: *FEVER GREATER THAN 100.4 F (38 C) OR HIGHER *CHILLS OR SWEATING *NAUSEA AND VOMITING THAT IS NOT CONTROLLED WITH YOUR NAUSEA MEDICATION *UNUSUAL SHORTNESS OF BREATH *UNUSUAL BRUISING OR BLEEDING *URINARY PROBLEMS (pain or burning when urinating, or frequent urination) *BOWEL PROBLEMS (unusual diarrhea, constipation, pain near the anus) TENDERNESS IN MOUTH AND THROAT WITH OR WITHOUT PRESENCE OF ULCERS (sore throat, sores in mouth, or a toothache) UNUSUAL RASH, SWELLING OR PAIN  UNUSUAL VAGINAL DISCHARGE OR ITCHING   Items with * indicate a potential emergency and should be followed up as soon as possible or go to the Emergency Department if any problems should occur.  Please show the CHEMOTHERAPY ALERT CARD or IMMUNOTHERAPY ALERT CARD  at check-in to the Emergency Department and triage nurse.  Should you have questions after your visit or need to cancel or reschedule your appointment, please contact [facility]  [phone] and follow the prompts.  Office hours are 8:00 a.m. to 4:30 p.m. Monday - Friday. Please note that voicemails left after 4:00 p.m. may not be returned until the following business day.  We are closed weekends and major holidays. You have access to a nurse at all times for urgent questions. Please call the main number to the clinic [phone] and follow the prompts.  For any non-urgent questions, you may also contact your provider using MyChart. We now offer e-Visits for anyone 43 and older to request care online for non-urgent symptoms. For details visit [email or web address].   Also download the MyChart app! Go to the app store, search "MyChart", open the app, select , and log in with your MyChart username and password.

## 2023-02-09 NOTE — Progress Notes (Signed)
Patient here for carfilzomib & isatuximab. K is 3.3 today and PLT 85. MD notified. Ok to proceed with PLT, no K today.

## 2023-02-09 NOTE — Progress Notes (Signed)
OK to proceed with chemo with platelet count of 85. No potassium today.

## 2023-02-09 NOTE — Patient Instructions (Signed)

## 2023-02-09 NOTE — Progress Notes (Signed)
Spoke with Dietra about the [employer] and the [employer], she will be in tomorrow to turn in proof of income.

## 2023-02-10 ENCOUNTER — Inpatient Hospital Stay: Payer: 59

## 2023-02-10 ENCOUNTER — Encounter: Payer: Self-pay | Admitting: Internal Medicine

## 2023-02-10 ENCOUNTER — Other Ambulatory Visit: Payer: Self-pay | Admitting: Internal Medicine

## 2023-02-10 VITALS — BP 150/76 | HR 71 | Temp 98.8°F | Resp 18

## 2023-02-10 DIAGNOSIS — C9000 Multiple myeloma not having achieved remission: Secondary | ICD-10-CM

## 2023-02-10 DIAGNOSIS — Z5112 Encounter for antineoplastic immunotherapy: Secondary | ICD-10-CM | POA: Diagnosis not present

## 2023-02-10 MED ORDER — SODIUM CHLORIDE 0.9 % IV SOLN
Freq: Once | INTRAVENOUS | Status: AC
  Filled 2023-02-10: qty 250

## 2023-02-10 MED ORDER — DEXTROSE 5 % IV SOLN
56.0000 mg/m2 | Freq: Once | INTRAVENOUS | Status: AC
  Administered 2023-02-10: 100 mg via INTRAVENOUS
  Filled 2023-02-10: qty 30

## 2023-02-10 MED ORDER — SODIUM CHLORIDE 0.9% FLUSH
10.0000 mL | INTRAVENOUS | Status: DC | PRN
  Filled 2023-02-10: qty 10

## 2023-02-10 MED ORDER — HEPARIN SOD (PORK) LOCK FLUSH 100 UNIT/ML IV SOLN
500.0000 [IU] | Freq: Once | INTRAVENOUS | Status: DC | PRN
  Filled 2023-02-10: qty 5

## 2023-02-10 MED ORDER — SODIUM CHLORIDE 0.9 % IV SOLN
20.0000 mg | Freq: Once | INTRAVENOUS | Status: AC
  Administered 2023-02-10: 20 mg via INTRAVENOUS
  Filled 2023-02-10: qty 20

## 2023-02-10 NOTE — Patient Instructions (Signed)
[facility] [place] [facility]  Discharge Instructions: Thank you for choosing [facility] to provide your oncology and hematology care.  If you have a lab appointment with the Cancer Center, please go directly to the Cancer Center and check in at the registration area.  Wear comfortable clothing and clothing appropriate for easy access to any Portacath or PICC line.   We strive to give you quality time with your provider. You may need to reschedule your appointment if you arrive late (15 or more minutes).  Arriving late affects you and other patients whose appointments are after yours.  Also, if you miss three or more appointments without notifying the office, you may be dismissed from the clinic at the provider's discretion.      For prescription refill requests, have your pharmacy contact our office and allow 72 hours for refills to be completed.    Today you received the following chemotherapy and/or immunotherapy agents Kyprolis      To help prevent nausea and vomiting after your treatment, we encourage you to take your nausea medication as directed.  BELOW ARE SYMPTOMS THAT SHOULD BE REPORTED IMMEDIATELY: *FEVER GREATER THAN 100.4 F (38 C) OR HIGHER *CHILLS OR SWEATING *NAUSEA AND VOMITING THAT IS NOT CONTROLLED WITH YOUR NAUSEA MEDICATION *UNUSUAL SHORTNESS OF BREATH *UNUSUAL BRUISING OR BLEEDING *URINARY PROBLEMS (pain or burning when urinating, or frequent urination) *BOWEL PROBLEMS (unusual diarrhea, constipation, pain near the anus) TENDERNESS IN MOUTH AND THROAT WITH OR WITHOUT PRESENCE OF ULCERS (sore throat, sores in mouth, or a toothache) UNUSUAL RASH, SWELLING OR PAIN  UNUSUAL VAGINAL DISCHARGE OR ITCHING   Items with * indicate a potential emergency and should be followed up as soon as possible or go to the Emergency Department if any problems should occur.  Please show the CHEMOTHERAPY ALERT CARD or IMMUNOTHERAPY ALERT CARD at check-in to  the Emergency Department and triage nurse.  Should you have questions after your visit or need to cancel or reschedule your appointment, please contact [facility] [place] [facility]  [phone] and follow the prompts.  Office hours are 8:00 a.m. to 4:30 p.m. Monday - Friday. Please note that voicemails left after 4:00 p.m. may not be returned until the following business day.  We are closed weekends and major holidays. You have access to a nurse at all times for urgent questions. Please call the main number to the clinic [phone] and follow the prompts.  For any non-urgent questions, you may also contact your provider using MyChart. We now offer e-Visits for anyone 18 and older to request care online for non-urgent symptoms. For details visit [email or web address].   Also download the MyChart app! Go to the app store, search "MyChart", open the app, select [facility], and log in with your MyChart username and password.    

## 2023-02-14 ENCOUNTER — Other Ambulatory Visit: Payer: Self-pay

## 2023-02-21 ENCOUNTER — Encounter: Payer: Self-pay | Admitting: Internal Medicine

## 2023-02-22 ENCOUNTER — Encounter: Payer: Self-pay | Admitting: Internal Medicine

## 2023-02-22 ENCOUNTER — Inpatient Hospital Stay (HOSPITAL_BASED_OUTPATIENT_CLINIC_OR_DEPARTMENT_OTHER): Payer: 59 | Admitting: Internal Medicine

## 2023-02-22 VITALS — BP 137/76 | HR 91 | Temp 99.3°F | Ht 62.0 in | Wt 172.2 lb

## 2023-02-22 DIAGNOSIS — C9000 Multiple myeloma not having achieved remission: Secondary | ICD-10-CM

## 2023-02-22 DIAGNOSIS — Z5181 Encounter for therapeutic drug level monitoring: Secondary | ICD-10-CM | POA: Diagnosis not present

## 2023-02-22 DIAGNOSIS — Z5112 Encounter for antineoplastic immunotherapy: Secondary | ICD-10-CM | POA: Diagnosis not present

## 2023-02-22 DIAGNOSIS — Z79899 Other long term (current) drug therapy: Secondary | ICD-10-CM | POA: Diagnosis not present

## 2023-02-22 MED FILL — Dexamethasone Sodium Phosphate Inj 100 MG/10ML: INTRAMUSCULAR | Qty: 2 | Status: AC

## 2023-02-22 NOTE — Progress Notes (Signed)
[facility] OFFICE PROGRESS NOTE  Patient Care Team: Leanna Sato, MD as PCP - General (Family Medicine) End, Cristal Deer, MD as PCP - Cardiology (Cardiology) Eddie Candle, [name] Earna Coder, MD as Consulting Physician (Hematology and Oncology) Vida Rigger, MD as Consulting Physician (Pulmonary Disease) Tedd Sias Marlana Salvage, MD as Consulting Physician (Endocrinology)   Cancer Staging  No matching staging information was found for the patient.   Oncology History Overview Note  # SEP 2018- MULTIPLE MYELOMA IgALamda [2.5 gm/dl; K/L= 16/1096]; STAGE III [beta 2 microglobulin=5.5] [presented with acute renal failure; anemia; NO hypercalcemia; Skeletal survey-Normal]; BMBx- 45% plasma cells; FISH-POSITIVE 11:14 translocation.[STANDARD-high RISK]/cyto-Normal; SEP 2018- PET- L3 posterior element lesion.   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 No morphologic support for residual myeloma disease. Negative for minimal residual disease by MM-MRD flow cytometry; HOLD REVLIMID [leg swelling]; AUG 2021-PET scan negative for myeloma; continue to hold Revlimid  # MARCH 2021-diastolic congestive heart failure [Dr.End]  # BMBx- OCT 2021- BMBx- 5% plasmacytosis-however this appears to be more polyclonal rather than monoclonal-not explain patient worsening anemia.   # OCT 2022- 18th- Dara- Rev-Dex; MARCH, 2024- PROGRESSION bone marrow biopsy  70% plasma cells; PET scan no bone lesions-however conus medullaris uptake-MRI lumbar spine-   # MAY 2024- DISCONTINUE carfilzomib-dexamethasone- Pomalyst; [rising M-protein; MAY 2024- 1.3. ]   # start 12/27/2022 -Isa-Carfil-Dex   #November 2021-hyperthyroidism/goiter- [D.Bennett/Dr.Solum]-methimazole. S/p Thyroidectomy [Dr.Kim; [facility]-AUG 2022]  --------------------------  # 12/12- RIGHT JUGULAR DVT-x 52m on xarelto; finished April 2020; September 2020-EGD/dysphagia; Dr. Tobi Bastos  # Acute renal failure [Dr.Singh; Proteinuria 1.5gm/day ]; acyclovir/Asprin ------------------------------------------------------------------------------------------------------------   DIAGNOSIS: [ ]  MULTIPLE MYELOMA  STAGE: III/HIGH RISK ;GOALS: CONTROL      Multiple myeloma not having achieved remission (HCC)  11/21/2017 - 04/28/2018 Chemotherapy   Patient is on Treatment Plan : MYELOMA SALVAGE Cyclophosphamide / Carfilzomib / Dexamethasone (CCd) q28d     05/12/2021 - 02/25/2022 Chemotherapy   Patient is on Treatment Plan : MYELOMA RELAPSED REFRACTORY Daratumumab SQ + Lenalidomide + Dexamethasone (DaraRd) q28d     05/12/2021 - 07/23/2022 Chemotherapy   Patient is on Treatment Plan : MYELOMA RELAPSED REFRACTORY Daratumumab SQ + Lenalidomide + Dexamethasone (DaraRd) q28d     11/02/2022 - 11/16/2022 Chemotherapy   Patient is on Treatment Plan : MYELOMA RELAPSED/ REFRACTORY Carfilzomib D1,8,15 (20/27) + Pomalidomide + Dexamethasone (KPd) q28d     12/27/2022 -  Chemotherapy   Patient is on Treatment Plan : MYELOMA Carfilzomib + Dexamethasone + Isatuximab q28d      INTERVAL HISTORY: Ambulating  independently.   Alone.  Brandi [name] 72 y.o.  female pleasant patient above history of relapsed multiple myeloma--currently  [name]- Carfil-Dex is here for a follow up.    Cough improved; mild swelling in legs.  No infections. Denies any worsening pain.  Patient taking tramadol as needed for leg cramps.    Review of  Systems  Constitutional:  Positive for malaise/fatigue. Negative for chills, diaphoresis and fever.  HENT:  Negative for nosebleeds and sore throat.  Eyes:  Negative for double vision.  Respiratory:  Negative for hemoptysis.   Cardiovascular:  Negative for chest pain, palpitations and orthopnea.  Gastrointestinal:  Negative for abdominal pain, blood in stool, constipation, heartburn and melena.  Genitourinary:  Negative for dysuria, frequency and urgency.  Musculoskeletal:  Positive for back pain.  Skin: Negative.  Negative for itching and rash.  Neurological:  Negative for dizziness, tingling, focal weakness, weakness and headaches.  Endo/Heme/Allergies:  Does not bruise/bleed easily.  Psychiatric/Behavioral:  Negative for depression. The patient is not nervous/anxious.     PAST MEDICAL HISTORY :  Past Medical History:  Diagnosis Date   (HFpEF) heart failure with preserved ejection fraction (HCC)    a. 06/2020 Echo: EF 60-65%, no rwma, nl RV size/fxn. Triv MR. Triv TR/AI. Mod elev PASP.   Anxiety    COPD (chronic obstructive pulmonary disease) (HCC)    GERD (gastroesophageal reflux disease)    Hypertension    Hyperthyroidism    Hypokalemia    IgA myeloma (HCC)    Multiple myeloma (HCC)    Pneumonia    Red blood cell antibody positive, compatible PRBC difficult to obtain    S/P autologous bone marrow transplantation (HCC)     PAST SURGICAL HISTORY :   Past Surgical History:  Procedure Laterality Date   ANTERIOR VITRECTOMY Left 10/07/2021   Procedure: ANTERIOR VITRECTOMY;  Surgeon: Lockie Mola, MD;  Location: [facility];  Service: Ophthalmology;  Laterality: Left;   CATARACT EXTRACTION W/PHACO Left 10/07/2021   Procedure: CATARACT EXTRACTION PHACO AND INTRAOCULAR LENS PLACEMENT (IOC) LEFT VISION BLUE;  Surgeon: Lockie Mola, MD;  Location: [facility];  Service: Ophthalmology;  Laterality: Left;  kit for manual incision 14.56 01:21.4    CATARACT EXTRACTION W/PHACO Right 10/21/2021   Procedure: CATARACT EXTRACTION PHACO AND INTRAOCULAR LENS PLACEMENT (IOC) RIGHT 11.12 01:48.1;  Surgeon: Lockie Mola, MD;  Location: [facility];  Service: Ophthalmology;  Laterality: Right;   ESOPHAGOGASTRODUODENOSCOPY (EGD) WITH PROPOFOL N/A 03/30/2019   Procedure: ESOPHAGOGASTRODUODENOSCOPY (EGD) WITH PROPOFOL;  Surgeon: Wyline Mood, MD;  Location: [facility];  Service: Gastroenterology;  Laterality: N/A;   IR FLUORO GUIDE PORT INSERTION RIGHT  12/16/2017   IR IMAGING GUIDED PORT INSERTION  06/15/2021   KNEE ARTHROSCOPY Left 09/03/2022   Procedure: ARTHROSCOPY KNEE DEBRIDEMENT;  Surgeon: Lyndle Herrlich, MD;  Location: [facility] OR[facility];  Service: Orthopedics;  Laterality: Left;   THYROIDECTOMY N/A     FAMILY HISTORY :   Family History  Problem Relation Age of Onset   Pneumonia Mother    Seizures Father     SOCIAL HISTORY:   Social History   Tobacco Use   Smoking status: Former    Current packs/day: 0.00    Types: Cigarettes    Quit date: 03/18/2021    Years since quitting: 1.9   Smokeless tobacco: Never   Tobacco comments:    2 cigarettes QOD  Vaping Use   Vaping status: Never Used  Substance Use Topics   Alcohol use: No   Drug use: No    ALLERGIES:  has No Known Allergies.  MEDICATIONS:  Current Outpatient Medications  Medication Sig Dispense Refill   acyclovir (ZOVIRAX) 400 MG tablet Take 1 tablet by mouth twice daily 60 tablet 0   albuterol (VENTOLIN HFA) 108 (90 Base) MCG/ACT inhaler Inhale 2 puffs into the lungs every 6 (six) hours as needed for wheezing or shortness of breath. 8 g 3   aspirin EC 81 MG EC tablet Take 1 tablet (81  mg total) by mouth daily. 90 tablet 3   calcium-vitamin D (OSCAL WITH D) 500MG -200UNIT ( ) tablet Take 1 tablet by mouth 2 (two) times daily. 60 tablet 0   carvedilol (COREG) 3.125 MG tablet Take 1 tablet (3.125 mg total) by mouth 2 (two) times daily with a meal. 60 tablet 5    furosemide (LASIX) 40 MG tablet Take 1 tablet (40 mg total) by mouth daily. 90 tablet 3   levothyroxine (SYNTHROID) 100 MCG tablet TAKE 1 TABLET BY MOUTH BEFORE BREAKFAST 90 tablet 0   lidocaine-prilocaine (EMLA) cream Apply 1 application topically as needed. Apply to port and cover with saran wrap 1-2 hours prior to port access 30 g 1   magnesium oxide (MAG-OX) 400 (240 Mg) MG tablet Take 1 tablet (400 mg total) by mouth 2 (two) times daily. 60 tablet 3   methylPREDNISolone (MEDROL DOSEPAK) 4 MG TBPK tablet Use as directed. 21 tablet 1   montelukast (SINGULAIR) 10 MG tablet Take 1 tablet (10 mg total) by mouth at bedtime. 30 tablet 3   ondansetron (ZOFRAN) 8 MG tablet One pill every 8 hours as needed for nausea/vomitting. 40 tablet 1   polyethylene glycol (MIRALAX / GLYCOLAX) packet Take 17 g by mouth daily as needed for mild constipation. 14 each 0   pomalidomide (POMALYST) 4 MG capsule Take 1 capsule (4 mg total) by mouth daily. Take for 21 days, then old for 7 days. Repeat every 28 days. 21 capsule 0   potassium chloride SA (KLOR-CON M) 20 MEQ tablet Take 1 tablet (20 mEq total) by mouth 2 (two) times daily. 60 tablet 6   spironolactone (ALDACTONE) 25 MG tablet Take 1 tablet by mouth once daily 90 tablet 3   traMADol (ULTRAM) 50 MG tablet Take 1 tablet (50 mg total) by mouth every 12 (twelve) hours as needed. 60 tablet 0   Vitamin D, Ergocalciferol, (DRISDOL) 1.25 MG (50000 UNIT) CAPS capsule Take 1 capsule (50,000 Units total) by mouth once a week. 12 capsule 0   pantoprazole (PROTONIX) 40 MG tablet Take 1 tablet (40 mg total) by mouth daily. 90 tablet 0   No current facility-administered medications for this visit.   Facility-Administered Medications Ordered in Other Visits  Medication Dose Route Frequency Provider Last Rate Last Admin   0.9 %  sodium chloride infusion   Intravenous Continuous Ralene Muskrat, PA-C        PHYSICAL EXAMINATION:   BP 137/76 (BP Location: Left Arm, Patient  Position: Sitting, Cuff Size: Large)   Pulse 91   Temp 99.3 F (37.4 C) (Tympanic)   Ht 5\' 2"  (1.575 m)   Wt 172 lb 3.2 oz (78.1 kg)   SpO2 100%   BMI 31.50 kg/m   Filed Weights   02/22/23 1527  Weight: 172 lb 3.2 oz (78.1 kg)    Bilateral coarse breath sounds.   Physical Exam HENT:     Head: Normocephalic and atraumatic.  Eyes:     Pupils: Pupils are equal, round, and reactive to light.  Cardiovascular:     Rate and Rhythm: Normal rate and regular rhythm.  Pulmonary:     Effort: Pulmonary effort is normal. No respiratory distress.     Breath sounds: Normal breath sounds. No wheezing.  Abdominal:     General: Bowel sounds are normal. There is no distension.     Palpations: Abdomen is soft. There is no mass.     Tenderness: There is no abdominal tenderness. There is no guarding or rebound.  Musculoskeletal:        General: No tenderness. Normal range of motion.     Cervical back: Normal range of motion and neck supple.  Skin:    General: Skin is warm.  Neurological:     Mental Status: She is alert and oriented to person, place, and time.  Psychiatric:        Mood and Affect: Affect normal.    LABORATORY DATA:  I have reviewed the data as listed    Component Value Date/Time   NA 138 02/09/2023 0935   NA 141 01/25/2020 1530   K 3.3 (L) 02/09/2023 0935   CL 109 02/09/2023 0935   CO2 21 (L) 02/09/2023 0935   GLUCOSE 90 02/09/2023 0935   BUN 18 02/09/2023 0935   BUN 19 01/25/2020 1530   CREATININE 0.84 02/09/2023 0935   CALCIUM 7.7 (L) 02/09/2023 0935   PROT 6.3 (L) 02/09/2023 0935   ALBUMIN 3.7 02/09/2023 0935   AST 13 (L) 02/09/2023 0935   ALT 10 02/09/2023 0935   ALKPHOS 58 02/09/2023 0935   BILITOT 0.7 02/09/2023 0935   GFRNONAA >60 02/09/2023 0935   GFRAA NOT CALCULATED 04/17/2020 1352    No results found for: "SPEP", "UPEP"  Lab Results  Component Value Date   WBC 4.0 02/09/2023   NEUTROABS 2.3 02/09/2023   HGB 9.4 (L) 02/09/2023   HCT 29.8 (L)  02/09/2023   MCV 94.0 02/09/2023   PLT 85 (L) 02/09/2023      Chemistry      Component Value Date/Time   NA 138 02/09/2023 0935   NA 141 01/25/2020 1530   K 3.3 (L) 02/09/2023 0935   CL 109 02/09/2023 0935   CO2 21 (L) 02/09/2023 0935   BUN 18 02/09/2023 0935   BUN 19 01/25/2020 1530   CREATININE 0.84 02/09/2023 0935      Component Value Date/Time   CALCIUM 7.7 (L) 02/09/2023 0935   ALKPHOS 58 02/09/2023 0935   AST 13 (L) 02/09/2023 0935   ALT 10 02/09/2023 0935   BILITOT 0.7 02/09/2023 0935      (H): Data is abnormally high   Latest Reference Range & Units Most Recent 12/24/20 14:01 04/01/21 14:34 04/28/21 09:17 06/11/21 09:42 07/03/21 10:32 08/20/21 10:06  M Protein SerPl Elph-Mcnc Not Observed g/dL 1.1 (H) (C) 1/61/09 60:45 Not Observed (C) 0.6 (H) (C) 0.7 (H) (C) 0.5 (H) (C) 0.7 (H) (C) 1.1 (H) (C)    Latest Reference Range & Units Most Recent 09/17/21 08:47 10/15/21 08:25 11/12/21 08:55  Kappa free light chain 3.3 - 19.4 mg/L 5.0 11/12/21 08:55 7.2 7.2 5.0  Lambda free light chains 5.7 - 26.3 mg/L 223.6 (H) 11/12/21 08:55 215.3 (H) 340.2 (H) 223.6 (H)  Kappa, lambda light chain ratio 0.26 - 1.65  0.02 (L) 11/12/21 08:55 0.03 (L) 0.02 (L) 0.02 (L)  (H): Data is abnormally high (L): Data is abnormally low  RADIOGRAPHIC STUDIES: I have personally reviewed the radiological images as listed and agreed with the findings in the report. No results found.   ASSESSMENT & PLAN:  Multiple myeloma not having achieved remission (HCC) # RECURRENT Multiple myeloma stage III [high-risk cytogenetics-status post KRD-VGPR ; status post autologous stem cell transplant on 06/15/18.  Most recently progressed on salvage therapy with daratumumab  lenalidomide [15 mg 3 w; 1-w] dexamethasone. plasma cell myeloma by approximately 70% of the overall cellular marrow;  FEB 2024-lambda light chain 284 Bone marrow-70% plasma cells. M Protein 0.9-rising lambda light chain ratio. Discontinue  Revlimid;  and dara-given the progressive disease.  MARCH 2024- PET scan: No skeletal lesions noted. APRIl 2024- MRI-cervical/thoracic/lumbar spine-negative for any lesions in the spinal cord. DISCONTINUEPomalyst;- carfilzomib-dexamethasone- [rising M-protein; MAY 2024- 1.3. ] start 12/27/2022-Isa-Carfil-Dex [[facility]]  #  12/27/2022-Isa-Dex; d-2carfil weekly weeklyx 4 q 28 days  [[facility]] MARCH 2024-Echo [[facility]] Left ventricular EF 50 to 55%.  #  Partial response noted.   Proceed with cycle #3 tomorrow. Will repeat 2 D echo.   # Hypokalemia: mild-  continue Kdur TID; hypocalcemia- 8.0- vit D march 2024- 50- recommend ca+vit D BID- stable.   # Iatrogenic hypothyroidism [ Graves/goiter s/p total thyroidectomy;aug,2022 ]-currently on Synthroid 100 mcg/day-MARCH 2024- TSH- 33; recommend  taking the pills fasting- stable. Awaiting repeat labs  # Lower back discomfort x 6 months-see above.  MRI lumbar spine as above.  Continue tramadol.  Stable.  # Bone lesions /last zometa on 11/27/2017.  S/p   Dental extraction PET scan evidence of any bone lesions.-Hold Zometa for now. stable.   # IV access: mediport- functional  # Vaccination: Flu shot s/p;  s/p Pneumonia vaccination [march 2024].   6 w- given dental extrac- on 1/26. :MM panel; K/l light chain ratio q 4 W D-isa-dex;car1&2-car; weekly x 4 q 28 day cycle  # DISPOSITION:  # 2 D echo ASAP # as per IS-  chemo tomorrow # as per IS-  # 1 week- MD- labs- chemo; D-2 chemo; # 2 week- MD labs-:MM panel; K/l light chain ratio; chemo; D-2 chemo - Dr.B   Orders Placed This Encounter  Procedures   ECHOCARDIOGRAM COMPLETE    Standing Status:   Future    Standing Expiration Date:   02/22/2024    Order Specific Question:   Where should this test be performed    Answer:   [facility]    Order Specific Question:   Please indicate who you request to read the nuc med / echo results.    Answer:   [facility]    Order Specific Question:   Perflutren DEFINITY (image  enhancing agent) should be administered unless hypersensitivity or allergy exist    Answer:   Administer Perflutren    Order Specific Question:   Reason for exam-Echo    Answer:   Chemo  Z09   All questions were answered. The patient knows to call the clinic with any problems, questions or concerns.      Earna Coder, MD 02/22/2023 4:18 PM

## 2023-02-22 NOTE — Assessment & Plan Note (Addendum)
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  Most recently progressed on salvage therapy with daratumumab  lenalidomide [15 mg 3 w; 1-w] dexamethasone. plasma cell myeloma by approximately 70% of the overall cellular marrow;  FEB 2024-lambda light chain 284 Bone marrow-70% plasma cells. M Protein 0.9-rising lambda light chain ratio. Discontinue Revlimid; and dara-given the progressive disease.  MARCH 2024- PET scan: No skeletal lesions noted. APRIl 2024- MRI-cervical/thoracic/lumbar spine-negative for any lesions in the spinal cord. DISCONTINUEPomalyst;- carfilzomib-dexamethasone- [rising M-protein; MAY 2024- 1.3. ] start 12/27/2022-Isa-Carfil-Dex [[facility]]  #  12/27/2022-Isa-Dex; d-2carfil weekly weeklyx 4 q 28 days  [[facility]] MARCH 2024-Echo [[facility]] Left ventricular EF 50 to 55%.  #  Partial response noted.   Proceed with cycle #3 tomorrow. Will repeat 2 D echo.   # Hypokalemia: mild-  continue Kdur TID; hypocalcemia- 8.0- vit D march 2024- 50- recommend ca+vit D BID- stable.   # Iatrogenic hypothyroidism [ Graves/goiter s/p total thyroidectomy;aug,2022 ]-currently on Synthroid 100 mcg/day-MARCH 2024- TSH- 33; recommend  taking the pills fasting- stable. Awaiting repeat labs  # Lower back discomfort x 6 months-see above.  MRI lumbar spine as above.  Continue tramadol.  Stable.  # Bone lesions /last zometa on 11/27/2017.  S/p   Dental extraction PET scan evidence of any bone lesions.-Hold Zometa for now. stable.   # IV access: mediport- functional  # Vaccination: Flu shot s/p;  s/p Pneumonia vaccination [march 2024].   6 w- given dental extrac- on 1/26. :MM panel; K/l light chain ratio q 4 W D-isa-dex;car1&2-car; weekly x 4 q 28 day cycle  # DISPOSITION:  # 2 D echo ASAP # as per IS-  chemo tomorrow # as per IS-  # 1 week- MD- labs- chemo; D-2 chemo; # 2 week- MD labs-:MM panel; K/l light chain ratio;  chemo; D-2 chemo - Dr.B

## 2023-02-22 NOTE — Progress Notes (Signed)
Enrolled [employer] into the [employer] and the [employer].

## 2023-02-22 NOTE — Progress Notes (Signed)
No concerns today 

## 2023-02-23 ENCOUNTER — Inpatient Hospital Stay: Payer: 59

## 2023-02-23 ENCOUNTER — Inpatient Hospital Stay: Payer: 59 | Admitting: Internal Medicine

## 2023-02-23 VITALS — BP 127/76 | HR 70 | Temp 97.1°F | Resp 16

## 2023-02-23 DIAGNOSIS — C9000 Multiple myeloma not having achieved remission: Secondary | ICD-10-CM

## 2023-02-23 DIAGNOSIS — Z5112 Encounter for antineoplastic immunotherapy: Secondary | ICD-10-CM | POA: Diagnosis not present

## 2023-02-23 LAB — CBC WITH DIFFERENTIAL (CANCER CENTER ONLY)
Abs Immature Granulocytes: 0.03 10*3/uL (ref 0.00–0.07)
Basophils Absolute: 0 10*3/uL (ref 0.0–0.1)
Basophils Relative: 0 %
Eosinophils Absolute: 0 10*3/uL (ref 0.0–0.5)
Eosinophils Relative: 0 %
HCT: 32.2 % — ABNORMAL LOW (ref 36.0–46.0)
Hemoglobin: 9.8 g/dL — ABNORMAL LOW (ref 12.0–15.0)
Immature Granulocytes: 0 %
Lymphocytes Relative: 30 %
Lymphs Abs: 2 10*3/uL (ref 0.7–4.0)
MCH: 29.4 pg (ref 26.0–34.0)
MCHC: 30.4 g/dL (ref 30.0–36.0)
MCV: 96.7 fL (ref 80.0–100.0)
Monocytes Absolute: 0.4 10*3/uL (ref 0.1–1.0)
Monocytes Relative: 6 %
Neutro Abs: 4.2 10*3/uL (ref 1.7–7.7)
Neutrophils Relative %: 64 %
Platelet Count: 282 10*3/uL (ref 150–400)
RBC: 3.33 MIL/uL — ABNORMAL LOW (ref 3.87–5.11)
RDW: 15.7 % — ABNORMAL HIGH (ref 11.5–15.5)
WBC Count: 6.7 10*3/uL (ref 4.0–10.5)
nRBC: 0 % (ref 0.0–0.2)

## 2023-02-23 LAB — CMP (CANCER CENTER ONLY)
ALT: 10 U/L (ref 0–44)
AST: 12 U/L — ABNORMAL LOW (ref 15–41)
Albumin: 4 g/dL (ref 3.5–5.0)
Alkaline Phosphatase: 65 U/L (ref 38–126)
Anion gap: 8 (ref 5–15)
BUN: 22 mg/dL (ref 8–23)
CO2: 20 mmol/L — ABNORMAL LOW (ref 22–32)
Calcium: 8.3 mg/dL — ABNORMAL LOW (ref 8.9–10.3)
Chloride: 108 mmol/L (ref 98–111)
Creatinine: 1.12 mg/dL — ABNORMAL HIGH (ref 0.44–1.00)
GFR, Estimated: 53 mL/min — ABNORMAL LOW (ref >60)
Glucose, Bld: 94 mg/dL (ref 70–99)
Potassium: 3.5 mmol/L (ref 3.5–5.1)
Sodium: 136 mmol/L (ref 135–145)
Total Bilirubin: 0.6 mg/dL (ref 0.3–1.2)
Total Protein: 6.9 g/dL (ref 6.5–8.1)

## 2023-02-23 MED ORDER — SODIUM CHLORIDE 0.9 % IV SOLN
10.0000 mg/kg | Freq: Once | INTRAVENOUS | Status: AC
  Administered 2023-02-23: 800 mg via INTRAVENOUS
  Filled 2023-02-23: qty 25

## 2023-02-23 MED ORDER — DIPHENHYDRAMINE HCL 50 MG/ML IJ SOLN
50.0000 mg | Freq: Once | INTRAMUSCULAR | Status: AC
  Administered 2023-02-23: 50 mg via INTRAVENOUS
  Filled 2023-02-23: qty 1

## 2023-02-23 MED ORDER — DEXTROSE 5 % IV SOLN
56.0000 mg/m2 | Freq: Once | INTRAVENOUS | Status: AC
  Administered 2023-02-23: 100 mg via INTRAVENOUS
  Filled 2023-02-23: qty 30

## 2023-02-23 MED ORDER — ACETAMINOPHEN 325 MG PO TABS
650.0000 mg | ORAL_TABLET | Freq: Once | ORAL | Status: AC
  Administered 2023-02-23: 650 mg via ORAL
  Filled 2023-02-23: qty 2

## 2023-02-23 MED ORDER — SODIUM CHLORIDE 0.9 % IV SOLN
Freq: Once | INTRAVENOUS | Status: AC
  Filled 2023-02-23: qty 250

## 2023-02-23 MED ORDER — SODIUM CHLORIDE 0.9 % IV SOLN
20.0000 mg | Freq: Once | INTRAVENOUS | Status: AC
  Administered 2023-02-23: 20 mg via INTRAVENOUS
  Filled 2023-02-23: qty 20

## 2023-02-23 MED ORDER — FAMOTIDINE IN NACL 20-0.9 MG/50ML-% IV SOLN
20.0000 mg | Freq: Once | INTRAVENOUS | Status: AC
  Administered 2023-02-23: 20 mg via INTRAVENOUS
  Filled 2023-02-23: qty 50

## 2023-02-23 MED ORDER — HEPARIN SOD (PORK) LOCK FLUSH 100 UNIT/ML IV SOLN
500.0000 [IU] | Freq: Once | INTRAVENOUS | Status: DC | PRN
  Filled 2023-02-23: qty 5

## 2023-02-23 NOTE — Patient Instructions (Signed)
[facility]  Discharge Instructions: Thank you for choosing [facility] to provide your oncology and hematology care.  If you have a lab appointment with the Cancer Center, please go directly to the Cancer Center and check in at the registration area.  Wear comfortable clothing and clothing appropriate for easy access to any Portacath or PICC line.   We strive to give you quality time with your provider. You may need to reschedule your appointment if you arrive late (15 or more minutes).  Arriving late affects you and other patients whose appointments are after yours.  Also, if you miss three or more appointments without notifying the office, you may be dismissed from the clinic at the provider's discretion.      For prescription refill requests, have your pharmacy contact our office and allow 72 hours for refills to be completed.    Today you received the following chemotherapy and/or immunotherapy agents Sarclisa & Kyprolis      To help prevent nausea and vomiting after your treatment, we encourage you to take your nausea medication as directed.  BELOW ARE SYMPTOMS THAT SHOULD BE REPORTED IMMEDIATELY: *FEVER GREATER THAN 100.4 F (38 C) OR HIGHER *CHILLS OR SWEATING *NAUSEA AND VOMITING THAT IS NOT CONTROLLED WITH YOUR NAUSEA MEDICATION *UNUSUAL SHORTNESS OF BREATH *UNUSUAL BRUISING OR BLEEDING *URINARY PROBLEMS (pain or burning when urinating, or frequent urination) *BOWEL PROBLEMS (unusual diarrhea, constipation, pain near the anus) TENDERNESS IN MOUTH AND THROAT WITH OR WITHOUT PRESENCE OF ULCERS (sore throat, sores in mouth, or a toothache) UNUSUAL RASH, SWELLING OR PAIN  UNUSUAL VAGINAL DISCHARGE OR ITCHING   Items with * indicate a potential emergency and should be followed up as soon as possible or go to the Emergency Department if any problems should occur.  Please show the CHEMOTHERAPY ALERT CARD or IMMUNOTHERAPY ALERT CARD at  check-in to the Emergency Department and triage nurse.  Should you have questions after your visit or need to cancel or reschedule your appointment, please contact [facility] [facility]  [phone] and follow the prompts.  Office hours are 8:00 a.m. to 4:30 p.m. Monday - Friday. Please note that voicemails left after 4:00 p.m. may not be returned until the following business day.  We are closed weekends and major holidays. You have access to a nurse at all times for urgent questions. Please call the main number to the clinic [phone] and follow the prompts.  For any non-urgent questions, you may also contact your provider using MyChart. We now offer e-Visits for anyone 52 and older to request care online for non-urgent symptoms. For details visit [email or web address].   Also download the MyChart app! Go to the app store, search "MyChart", open the app, select Center, and log in with your MyChart username and password.

## 2023-02-24 ENCOUNTER — Inpatient Hospital Stay: Payer: 59 | Attending: Nurse Practitioner

## 2023-02-24 VITALS — BP 123/69 | HR 59 | Temp 97.9°F

## 2023-02-24 DIAGNOSIS — Z79620 Long term (current) use of immunosuppressive biologic: Secondary | ICD-10-CM | POA: Insufficient documentation

## 2023-02-24 DIAGNOSIS — Z5112 Encounter for antineoplastic immunotherapy: Secondary | ICD-10-CM | POA: Diagnosis present

## 2023-02-24 DIAGNOSIS — Z87891 Personal history of nicotine dependence: Secondary | ICD-10-CM | POA: Diagnosis not present

## 2023-02-24 DIAGNOSIS — C9000 Multiple myeloma not having achieved remission: Secondary | ICD-10-CM

## 2023-02-24 DIAGNOSIS — C9002 Multiple myeloma in relapse: Secondary | ICD-10-CM | POA: Diagnosis present

## 2023-02-24 MED ORDER — HEPARIN SOD (PORK) LOCK FLUSH 100 UNIT/ML IV SOLN
500.0000 [IU] | Freq: Once | INTRAVENOUS | Status: AC | PRN
  Administered 2023-02-24: 500 [IU]
  Filled 2023-02-24: qty 5

## 2023-02-24 MED ORDER — SODIUM CHLORIDE 0.9 % IV SOLN
20.0000 mg | Freq: Once | INTRAVENOUS | Status: AC
  Administered 2023-02-24: 20 mg via INTRAVENOUS
  Filled 2023-02-24: qty 20

## 2023-02-24 MED ORDER — SODIUM CHLORIDE 0.9 % IV SOLN
Freq: Once | INTRAVENOUS | Status: AC
  Filled 2023-02-24: qty 250

## 2023-02-24 MED ORDER — DEXTROSE 5 % IV SOLN
56.0000 mg/m2 | Freq: Once | INTRAVENOUS | Status: AC
  Administered 2023-02-24: 100 mg via INTRAVENOUS
  Filled 2023-02-24: qty 30

## 2023-02-24 MED ORDER — HEPARIN SOD (PORK) LOCK FLUSH 100 UNIT/ML IV SOLN
INTRAVENOUS | Status: AC
  Filled 2023-02-24: qty 5

## 2023-02-24 NOTE — Patient Instructions (Signed)
[facility] [place] [facility]  Discharge Instructions: Thank you for choosing [facility] to provide your oncology and hematology care.  If you have a lab appointment with the Cancer Center, please go directly to the Cancer Center and check in at the registration area.  Wear comfortable clothing and clothing appropriate for easy access to any Portacath or PICC line.   We strive to give you quality time with your provider. You may need to reschedule your appointment if you arrive late (15 or more minutes).  Arriving late affects you and other patients whose appointments are after yours.  Also, if you miss three or more appointments without notifying the office, you may be dismissed from the clinic at the provider's discretion.      For prescription refill requests, have your pharmacy contact our office and allow 72 hours for refills to be completed.    Today you received the following chemotherapy and/or immunotherapy agents Kyprolis      To help prevent nausea and vomiting after your treatment, we encourage you to take your nausea medication as directed.  BELOW ARE SYMPTOMS THAT SHOULD BE REPORTED IMMEDIATELY: *FEVER GREATER THAN 100.4 F (38 C) OR HIGHER *CHILLS OR SWEATING *NAUSEA AND VOMITING THAT IS NOT CONTROLLED WITH YOUR NAUSEA MEDICATION *UNUSUAL SHORTNESS OF BREATH *UNUSUAL BRUISING OR BLEEDING *URINARY PROBLEMS (pain or burning when urinating, or frequent urination) *BOWEL PROBLEMS (unusual diarrhea, constipation, pain near the anus) TENDERNESS IN MOUTH AND THROAT WITH OR WITHOUT PRESENCE OF ULCERS (sore throat, sores in mouth, or a toothache) UNUSUAL RASH, SWELLING OR PAIN  UNUSUAL VAGINAL DISCHARGE OR ITCHING   Items with * indicate a potential emergency and should be followed up as soon as possible or go to the Emergency Department if any problems should occur.  Please show the CHEMOTHERAPY ALERT CARD or IMMUNOTHERAPY ALERT CARD at check-in to  the Emergency Department and triage nurse.  Should you have questions after your visit or need to cancel or reschedule your appointment, please contact [facility] [place] [facility]  [phone] and follow the prompts.  Office hours are 8:00 a.m. to 4:30 p.m. Monday - Friday. Please note that voicemails left after 4:00 p.m. may not be returned until the following business day.  We are closed weekends and major holidays. You have access to a nurse at all times for urgent questions. Please call the main number to the clinic [phone] and follow the prompts.  For any non-urgent questions, you may also contact your provider using MyChart. We now offer e-Visits for anyone 18 and older to request care online for non-urgent symptoms. For details visit [email or web address].   Also download the MyChart app! Go to the app store, search "MyChart", open the app, select [facility], and log in with your MyChart username and password.    

## 2023-02-25 ENCOUNTER — Encounter: Payer: Self-pay | Admitting: Internal Medicine

## 2023-02-28 ENCOUNTER — Encounter: Payer: Self-pay | Admitting: Internal Medicine

## 2023-03-01 ENCOUNTER — Encounter: Payer: Self-pay | Admitting: Internal Medicine

## 2023-03-01 MED FILL — Dexamethasone Sodium Phosphate Inj 100 MG/10ML: INTRAMUSCULAR | Qty: 2 | Status: AC

## 2023-03-02 ENCOUNTER — Inpatient Hospital Stay: Payer: 59

## 2023-03-02 ENCOUNTER — Telehealth: Payer: Self-pay | Admitting: *Deleted

## 2023-03-02 ENCOUNTER — Other Ambulatory Visit: Payer: Self-pay

## 2023-03-02 ENCOUNTER — Inpatient Hospital Stay: Payer: 59 | Admitting: Internal Medicine

## 2023-03-02 ENCOUNTER — Encounter: Payer: Self-pay | Admitting: Internal Medicine

## 2023-03-02 ENCOUNTER — Other Ambulatory Visit: Payer: Self-pay | Admitting: *Deleted

## 2023-03-02 VITALS — BP 140/72 | HR 60 | Temp 98.4°F | Resp 19

## 2023-03-02 DIAGNOSIS — C9000 Multiple myeloma not having achieved remission: Secondary | ICD-10-CM

## 2023-03-02 DIAGNOSIS — Z5112 Encounter for antineoplastic immunotherapy: Secondary | ICD-10-CM | POA: Diagnosis not present

## 2023-03-02 LAB — CBC WITH DIFFERENTIAL (CANCER CENTER ONLY)
Abs Immature Granulocytes: 0.03 K/uL (ref 0.00–0.07)
Basophils Absolute: 0 K/uL (ref 0.0–0.1)
Basophils Relative: 0 %
Eosinophils Absolute: 0 K/uL (ref 0.0–0.5)
Eosinophils Relative: 1 %
HCT: 32.9 % — ABNORMAL LOW (ref 36.0–46.0)
Hemoglobin: 10.1 g/dL — ABNORMAL LOW (ref 12.0–15.0)
Immature Granulocytes: 1 %
Lymphocytes Relative: 32 %
Lymphs Abs: 1.3 K/uL (ref 0.7–4.0)
MCH: 29.5 pg (ref 26.0–34.0)
MCHC: 30.7 g/dL (ref 30.0–36.0)
MCV: 96.2 fL (ref 80.0–100.0)
Monocytes Absolute: 0.5 K/uL (ref 0.1–1.0)
Monocytes Relative: 12 %
Neutro Abs: 2.2 K/uL (ref 1.7–7.7)
Neutrophils Relative %: 54 %
Platelet Count: 65 K/uL — ABNORMAL LOW (ref 150–400)
RBC: 3.42 MIL/uL — ABNORMAL LOW (ref 3.87–5.11)
RDW: 15.9 % — ABNORMAL HIGH (ref 11.5–15.5)
WBC Count: 4.1 K/uL (ref 4.0–10.5)
nRBC: 0.7 % — ABNORMAL HIGH (ref 0.0–0.2)

## 2023-03-02 LAB — CMP (CANCER CENTER ONLY)
ALT: 10 U/L (ref 0–44)
AST: 13 U/L — ABNORMAL LOW (ref 15–41)
Albumin: 4.1 g/dL (ref 3.5–5.0)
Alkaline Phosphatase: 54 U/L (ref 38–126)
Anion gap: 6 (ref 5–15)
BUN: 20 mg/dL (ref 8–23)
CO2: 21 mmol/L — ABNORMAL LOW (ref 22–32)
Calcium: 8 mg/dL — ABNORMAL LOW (ref 8.9–10.3)
Chloride: 111 mmol/L (ref 98–111)
Creatinine: 0.78 mg/dL (ref 0.44–1.00)
GFR, Estimated: >60 mL/min (ref >60)
Glucose, Bld: 101 mg/dL — ABNORMAL HIGH (ref 70–99)
Potassium: 3.3 mmol/L — ABNORMAL LOW (ref 3.5–5.1)
Sodium: 138 mmol/L (ref 135–145)
Total Bilirubin: 0.4 mg/dL (ref 0.3–1.2)
Total Protein: 6.4 g/dL — ABNORMAL LOW (ref 6.5–8.1)

## 2023-03-02 LAB — TSH: TSH: 24.954 u[IU]/mL — ABNORMAL HIGH (ref 0.350–4.500)

## 2023-03-02 MED ORDER — LEVOTHYROXINE SODIUM 150 MCG PO TABS: 150.0000 ug | ORAL_TABLET | Freq: Every day | ORAL | 3 refills | Status: DC

## 2023-03-02 MED ORDER — DEXTROSE 5 % IV SOLN
56.0000 mg/m2 | Freq: Once | INTRAVENOUS | Status: AC
  Administered 2023-03-02: 100 mg via INTRAVENOUS
  Filled 2023-03-02: qty 30

## 2023-03-02 MED ORDER — SODIUM CHLORIDE 0.9 % IV SOLN
Freq: Once | INTRAVENOUS | Status: AC
  Filled 2023-03-02: qty 250

## 2023-03-02 MED ORDER — SODIUM CHLORIDE 0.9 % IV SOLN
20.0000 mg | Freq: Once | INTRAVENOUS | Status: AC
  Administered 2023-03-02: 20 mg via INTRAVENOUS
  Filled 2023-03-02: qty 20

## 2023-03-02 MED ORDER — HEPARIN SOD (PORK) LOCK FLUSH 100 UNIT/ML IV SOLN
500.0000 [IU] | Freq: Once | INTRAVENOUS | Status: AC | PRN
  Administered 2023-03-02: 500 [IU]
  Filled 2023-03-02: qty 5

## 2023-03-02 NOTE — Assessment & Plan Note (Addendum)
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  Most recently progressed on salvage therapy with daratumumab  lenalidomide [15 mg 3 w; 1-w] dexamethasone. plasma cell myeloma by approximately 70% of the overall cellular marrow;  FEB 2024-lambda light chain 284 Bone marrow-70% plasma cells. M Protein 0.9-rising lambda light chain ratio. Discontinue Revlimid; and dara-given the progressive disease.  MARCH 2024- PET scan: No skeletal lesions noted. APRIl 2024- MRI-cervical/thoracic/lumbar spine-negative for any lesions in the spinal cord. DISCONTINUEPomalyst;- carfilzomib-dexamethasone- [rising M-protein; MAY 2024- 1.3. ] start 12/27/2022-Isa-Carfil-Dex [[facility]]  #  12/27/2022-Isa-Dex; d-2carfil weekly 4 q 28 days  [[facility]] MARCH 2024-Echo [[facility]] Left ventricular EF 50 to 55%.  #  Partial response noted.   Proceed with cycle #3; day-8  today. Awaiting on 2 D echo- 8/13.   # Hypokalemia: mild-  continue Kdur TID; hypocalcemia- 8.0- vit D march 2024- 50- continie Ergo 50k weekly. REcommend ca BID stable.   # Iatrogenic hypothyroidism [ Graves/goiter s/p total thyroidectomy;aug,2022 ]-currently on Synthroid 100 mcg/day-MARCH 2024- TSH- 33; recommend  taking the pills fasting- stable. Awaiting repeat labs  # Lower back discomfort x 6 months-see above.  MRI lumbar spine as above.  Continue tramadol.  Stable.  # Bone lesions /last zometa on 11/27/2017.  S/p   Dental extraction PET scan evidence of any bone lesions.-Hold Zometa for now. stable.   # IV access: mediport- functional  # Vaccination: Flu shot s/p;  s/p Pneumonia vaccination [march 2024]- stable.   6 w- given dental extrac- on 1/26. :MM panel; K/l light chain ratio q 4 W D-isa-dex;car1&2-car; weekly x 4 q 28 day cycle  # DISPOSITION: # ADD TSH to labs today.   # as per IS-  chemo tomorrow # as per IS-  as planned # 1 week- MD labs-:MM panel; K/l light  chain ratio; chemo; D-2 chemo - Dr.B

## 2023-03-02 NOTE — Progress Notes (Signed)
Pt.notified

## 2023-03-02 NOTE — Patient Instructions (Signed)
[facility]  Discharge Instructions: Thank you for choosing [facility] to provide your oncology and hematology care.  If you have a lab appointment with the Cancer Center, please go directly to the Cancer Center and check in at the registration area.  Wear comfortable clothing and clothing appropriate for easy access to any Portacath or PICC line.   We strive to give you quality time with your provider. You may need to reschedule your appointment if you arrive late (15 or more minutes).  Arriving late affects you and other patients whose appointments are after yours.  Also, if you miss three or more appointments without notifying the office, you may be dismissed from the clinic at the provider's discretion.      For prescription refill requests, have your pharmacy contact our office and allow 72 hours for refills to be completed.    Today you received the following chemotherapy and/or immunotherapy agents- kyprolis      To help prevent nausea and vomiting after your treatment, we encourage you to take your nausea medication as directed.  BELOW ARE SYMPTOMS THAT SHOULD BE REPORTED IMMEDIATELY: *FEVER GREATER THAN 100.4 F (38 C) OR HIGHER *CHILLS OR SWEATING *NAUSEA AND VOMITING THAT IS NOT CONTROLLED WITH YOUR NAUSEA MEDICATION *UNUSUAL SHORTNESS OF BREATH *UNUSUAL BRUISING OR BLEEDING *URINARY PROBLEMS (pain or burning when urinating, or frequent urination) *BOWEL PROBLEMS (unusual diarrhea, constipation, pain near the anus) TENDERNESS IN MOUTH AND THROAT WITH OR WITHOUT PRESENCE OF ULCERS (sore throat, sores in mouth, or a toothache) UNUSUAL RASH, SWELLING OR PAIN  UNUSUAL VAGINAL DISCHARGE OR ITCHING   Items with * indicate a potential emergency and should be followed up as soon as possible or go to the Emergency Department if any problems should occur.  Please show the CHEMOTHERAPY ALERT CARD or IMMUNOTHERAPY ALERT CARD at check-in to  the Emergency Department and triage nurse.  Should you have questions after your visit or need to cancel or reschedule your appointment, please contact [facility]  [phone] and follow the prompts.  Office hours are 8:00 a.m. to 4:30 p.m. Monday - Friday. Please note that voicemails left after 4:00 p.m. may not be returned until the following business day.  We are closed weekends and major holidays. You have access to a nurse at all times for urgent questions. Please call the main number to the clinic [phone] and follow the prompts.  For any non-urgent questions, you may also contact your provider using MyChart. We now offer e-Visits for anyone 18 and older to request care online for non-urgent symptoms. For details visit [email or web address].   Also download the MyChart app! Go to the app store, search "MyChart", open the app, select [facility], and log in with your MyChart username and password.    

## 2023-03-02 NOTE — Progress Notes (Signed)
No concerns today 

## 2023-03-02 NOTE — Telephone Encounter (Signed)
Inform pt of  the need to increase her thyroid medication. New prescription called into pharmacy.

## 2023-03-02 NOTE — Progress Notes (Signed)
[facility] OFFICE PROGRESS NOTE  Patient Care Team: Leanna Sato, MD as PCP - General (Family Medicine) End, Cristal Deer, MD as PCP - Cardiology (Cardiology) Eddie Candle, [name] Earna Coder, MD as Consulting Physician (Hematology and Oncology) Vida Rigger, MD as Consulting Physician (Pulmonary Disease) Tedd Sias Marlana Salvage, MD as Consulting Physician (Endocrinology)   Cancer Staging  No matching staging information was found for the patient.   Oncology History Overview Note  # SEP 2018- MULTIPLE MYELOMA IgALamda [2.5 gm/dl; K/L= 63/0160]; STAGE III [beta 2 microglobulin=5.5] [presented with acute renal failure; anemia; NO hypercalcemia; Skeletal survey-Normal]; BMBx- 45% plasma cells; FISH-POSITIVE 11:14 translocation.[STANDARD-high RISK]/cyto-Normal; SEP 2018- PET- L3 posterior element lesion.   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 No morphologic support for residual myeloma disease. Negative for minimal residual disease by MM-MRD flow cytometry; HOLD REVLIMID [leg swelling]; AUG 2021-PET scan negative for myeloma; continue to hold Revlimid  # MARCH 2021-diastolic congestive heart failure [Dr.End]  # BMBx- OCT 2021- BMBx- 5% plasmacytosis-however this appears to be more polyclonal rather than monoclonal-not explain patient worsening anemia.   # OCT 2022- 18th- Dara- Rev-Dex; MARCH, 2024- PROGRESSION bone marrow biopsy  70% plasma cells; PET scan no bone lesions-however conus medullaris uptake-MRI lumbar spine-   # MAY 2024- DISCONTINUE carfilzomib-dexamethasone- Pomalyst; [rising M-protein; MAY 2024- 1.3. ]   # start 12/27/2022 -Isa-Carfil-Dex   #November 2021-hyperthyroidism/goiter- [D.Bennett/Dr.Solum]-methimazole. S/p Thyroidectomy [Dr.Kim; [facility]-AUG 2022]  --------------------------  # 12/12- RIGHT JUGULAR DVT-x 26m on xarelto; finished April 2020; September 2020-EGD/dysphagia; Dr. Tobi Bastos  # Acute renal failure [Dr.Singh; Proteinuria 1.5gm/day ]; acyclovir/Asprin ------------------------------------------------------------------------------------------------------------   DIAGNOSIS: [ ]  MULTIPLE MYELOMA  STAGE: III/HIGH RISK ;GOALS: CONTROL      Multiple myeloma not having achieved remission (HCC)  11/21/2017 - 04/28/2018 Chemotherapy   Patient is on Treatment Plan : MYELOMA SALVAGE Cyclophosphamide / Carfilzomib / Dexamethasone (CCd) q28d     05/12/2021 - 02/25/2022 Chemotherapy   Patient is on Treatment Plan : MYELOMA RELAPSED REFRACTORY Daratumumab SQ + Lenalidomide + Dexamethasone (DaraRd) q28d     05/12/2021 - 07/23/2022 Chemotherapy   Patient is on Treatment Plan : MYELOMA RELAPSED REFRACTORY Daratumumab SQ + Lenalidomide + Dexamethasone (DaraRd) q28d     11/02/2022 - 11/16/2022 Chemotherapy   Patient is on Treatment Plan : MYELOMA RELAPSED/ REFRACTORY Carfilzomib D1,8,15 (20/27) + Pomalidomide + Dexamethasone (KPd) q28d     12/27/2022 -  Chemotherapy   Patient is on Treatment Plan : MYELOMA Carfilzomib + Dexamethasone + Isatuximab q28d      INTERVAL HISTORY: Ambulating  independently.   Alone.  Brandi [name] 72 y.o.  female pleasant patient above history of relapsed multiple myeloma--currently  [name]- Carfil-Dex is here for a follow up.    Cough improved;  no swelling in legs.  No infections. Denies any worsening pain.  Patient taking tramadol as needed for leg cramps.    Review of Systems   Constitutional:  Positive for malaise/fatigue. Negative for chills, diaphoresis and fever.  HENT:  Negative for nosebleeds and sore  throat.   Eyes:  Negative for double vision.  Respiratory:  Negative for hemoptysis.   Cardiovascular:  Negative for chest pain, palpitations and orthopnea.  Gastrointestinal:  Negative for abdominal pain, blood in stool, constipation, heartburn and melena.  Genitourinary:  Negative for dysuria, frequency and urgency.  Musculoskeletal:  Positive for back pain.  Skin: Negative.  Negative for itching and rash.  Neurological:  Negative for dizziness, tingling, focal weakness, weakness and headaches.  Endo/Heme/Allergies:  Does not bruise/bleed easily.  Psychiatric/Behavioral:  Negative for depression. The patient is not nervous/anxious.     PAST MEDICAL HISTORY :  Past Medical History:  Diagnosis Date   (HFpEF) heart failure with preserved ejection fraction (HCC)    a. 06/2020 Echo: EF 60-65%, no rwma, nl RV size/fxn. Triv MR. Triv TR/AI. Mod elev PASP.   Anxiety    COPD (chronic obstructive pulmonary disease) (HCC)    GERD (gastroesophageal reflux disease)    Hypertension    Hyperthyroidism    Hypokalemia    IgA myeloma (HCC)    Multiple myeloma (HCC)    Pneumonia    Red blood cell antibody positive, compatible PRBC difficult to obtain    S/P autologous bone marrow transplantation (HCC)     PAST SURGICAL HISTORY :   Past Surgical History:  Procedure Laterality Date   ANTERIOR VITRECTOMY Left 10/07/2021   Procedure: ANTERIOR VITRECTOMY;  Surgeon: Lockie Mola, MD;  [facility];  Service: Ophthalmology;  Laterality: Left;   CATARACT EXTRACTION W/PHACO Left 10/07/2021   Procedure: CATARACT EXTRACTION PHACO AND INTRAOCULAR LENS PLACEMENT (IOC) LEFT VISION BLUE;  Surgeon: Lockie Mola, MD;  Location: [facility];  Service: Ophthalmology;  Laterality: Left;  kit for manual incision 14.56 01:21.4   CATARACT  EXTRACTION W/PHACO Right 10/21/2021   Procedure: CATARACT EXTRACTION PHACO AND INTRAOCULAR LENS PLACEMENT (IOC) RIGHT 11.12 01:48.1;  Surgeon: Lockie Mola, MD;  Location: [facility];  Service: Ophthalmology;  Laterality: Right;   ESOPHAGOGASTRODUODENOSCOPY (EGD) WITH PROPOFOL N/A 03/30/2019   Procedure: ESOPHAGOGASTRODUODENOSCOPY (EGD) WITH PROPOFOL;  Surgeon: Wyline Mood, MD;  Location: [facility];  Service: Gastroenterology;  Laterality: N/A;   IR FLUORO GUIDE PORT INSERTION RIGHT  12/16/2017   IR IMAGING GUIDED PORT INSERTION  06/15/2021   KNEE ARTHROSCOPY Left 09/03/2022   Procedure: ARTHROSCOPY KNEE DEBRIDEMENT;  Surgeon: Lyndle Herrlich, MD;  Location: [facility] OR[facility];  Service: Orthopedics;  Laterality: Left;   THYROIDECTOMY N/A     FAMILY HISTORY :   Family History  Problem Relation Age of Onset   Pneumonia Mother    Seizures Father     SOCIAL HISTORY:   Social History   Tobacco Use   Smoking status: Former    Current packs/day: 0.00    Types: Cigarettes    Quit date: 03/18/2021    Years since quitting: 1.9   Smokeless tobacco: Never   Tobacco comments:    2 cigarettes QOD  Vaping Use   Vaping status: Never Used  Substance Use Topics   Alcohol use: No   Drug use: No    ALLERGIES:  has No Known Allergies.  MEDICATIONS:  Current Outpatient Medications  Medication Sig Dispense Refill   acyclovir (ZOVIRAX) 400 MG tablet Take 1 tablet by mouth twice daily 60 tablet 0   albuterol (VENTOLIN HFA) 108 (90 Base) MCG/ACT inhaler Inhale 2 puffs into the lungs every 6 (six) hours as needed for wheezing or shortness of breath. 8 g 3   aspirin EC 81 MG EC tablet Take  1 tablet (81 mg total) by mouth daily. 90 tablet 3   calcium-vitamin D (OSCAL WITH D) 500MG -200UNIT ( ) tablet Take 1 tablet by mouth 2 (two) times daily. 60 tablet 0   carvedilol (COREG) 3.125 MG tablet Take 1 tablet (3.125 mg total) by mouth 2 (two) times daily with a meal. 60 tablet 5   furosemide  (LASIX) 40 MG tablet Take 1 tablet (40 mg total) by mouth daily. 90 tablet 3   levothyroxine (SYNTHROID) 100 MCG tablet TAKE 1 TABLET BY MOUTH BEFORE BREAKFAST 90 tablet 0   lidocaine-prilocaine (EMLA) cream Apply 1 application topically as needed. Apply to port and cover with saran wrap 1-2 hours prior to port access 30 g 1   magnesium oxide (MAG-OX) 400 (240 Mg) MG tablet Take 1 tablet (400 mg total) by mouth 2 (two) times daily. 60 tablet 3   methylPREDNISolone (MEDROL DOSEPAK) 4 MG TBPK tablet Use as directed. 21 tablet 1   montelukast (SINGULAIR) 10 MG tablet Take 1 tablet (10 mg total) by mouth at bedtime. 30 tablet 3   ondansetron (ZOFRAN) 8 MG tablet One pill every 8 hours as needed for nausea/vomitting. 40 tablet 1   polyethylene glycol (MIRALAX / GLYCOLAX) packet Take 17 g by mouth daily as needed for mild constipation. 14 each 0   pomalidomide (POMALYST) 4 MG capsule Take 1 capsule (4 mg total) by mouth daily. Take for 21 days, then old for 7 days. Repeat every 28 days. 21 capsule 0   potassium chloride SA (KLOR-CON M) 20 MEQ tablet Take 1 tablet (20 mEq total) by mouth 2 (two) times daily. 60 tablet 6   spironolactone (ALDACTONE) 25 MG tablet Take 1 tablet by mouth once daily 90 tablet 3   traMADol (ULTRAM) 50 MG tablet Take 1 tablet (50 mg total) by mouth every 12 (twelve) hours as needed. 60 tablet 0   Vitamin D, Ergocalciferol, (DRISDOL) 1.25 MG (50000 UNIT) CAPS capsule Take 1 capsule (50,000 Units total) by mouth once a week. 12 capsule 0   pantoprazole (PROTONIX) 40 MG tablet Take 1 tablet (40 mg total) by mouth daily. 90 tablet 0   No current facility-administered medications for this visit.   Facility-Administered Medications Ordered in Other Visits  Medication Dose Route Frequency Provider Last Rate Last Admin   0.9 %  sodium chloride infusion   Intravenous Continuous Ralene Muskrat, PA-C       carfilzomib (KYPROLIS) 100 mg in dextrose 5 % 100 mL chemo infusion  56 mg/m2  (Treatment Plan Recorded) Intravenous Once Earna Coder, MD        PHYSICAL EXAMINATION:   BP 130/85 (BP Location: Left Arm, Patient Position: Sitting, Cuff Size: Normal)   Pulse 75   Temp 99.2 F (37.3 C) (Tympanic)   Ht 5\' 2"  (1.575 m)   Wt 172 lb (78 kg)   SpO2 100%   BMI 31.46 kg/m   Filed Weights   03/02/23 0815  Weight: 172 lb (78 kg)    Physical Exam HENT:     Head: Normocephalic and atraumatic.  Eyes:     Pupils: Pupils are equal, round, and reactive to light.  Cardiovascular:     Rate and Rhythm: Normal rate and regular rhythm.  Pulmonary:     Effort: Pulmonary effort is normal. No respiratory distress.     Breath sounds: Normal breath sounds. No wheezing.  Abdominal:     General: Bowel sounds are normal. There is no distension.     Palpations:  Abdomen is soft. There is no mass.     Tenderness: There is no abdominal tenderness. There is no guarding or rebound.  Musculoskeletal:        General: No tenderness. Normal range of motion.     Cervical back: Normal range of motion and neck supple.  Skin:    General: Skin is warm.  Neurological:     Mental Status: She is alert and oriented to person, place, and time.  Psychiatric:        Mood and Affect: Affect normal.    LABORATORY DATA:  I have reviewed the data as listed    Component Value Date/Time   NA 138 03/02/2023 0821   NA 141 01/25/2020 1530   K 3.3 (L) 03/02/2023 0821   CL 111 03/02/2023 0821   CO2 21 (L) 03/02/2023 0821   GLUCOSE 101 (H) 03/02/2023 0821   BUN 20 03/02/2023 0821   BUN 19 01/25/2020 1530   CREATININE 0.78 03/02/2023 0821   CALCIUM 8.0 (L) 03/02/2023 0821   PROT 6.4 (L) 03/02/2023 0821   ALBUMIN 4.1 03/02/2023 0821   AST 13 (L) 03/02/2023 0821   ALT 10 03/02/2023 0821   ALKPHOS 54 03/02/2023 0821   BILITOT 0.4 03/02/2023 0821   GFRNONAA >60 03/02/2023 0821   GFRAA NOT CALCULATED 04/17/2020 1352    No results found for: "SPEP", "UPEP"  Lab Results  Component  Value Date   WBC 4.1 03/02/2023   NEUTROABS 2.2 03/02/2023   HGB 10.1 (L) 03/02/2023   HCT 32.9 (L) 03/02/2023   MCV 96.2 03/02/2023   PLT 65 (L) 03/02/2023      Chemistry      Component Value Date/Time   NA 138 03/02/2023 0821   NA 141 01/25/2020 1530   K 3.3 (L) 03/02/2023 0821   CL 111 03/02/2023 0821   CO2 21 (L) 03/02/2023 0821   BUN 20 03/02/2023 0821   BUN 19 01/25/2020 1530   CREATININE 0.78 03/02/2023 0821      Component Value Date/Time   CALCIUM 8.0 (L) 03/02/2023 0821   ALKPHOS 54 03/02/2023 0821   AST 13 (L) 03/02/2023 0821   ALT 10 03/02/2023 0821   BILITOT 0.4 03/02/2023 0821      (H): Data is abnormally high   Latest Reference Range & Units Most Recent 12/24/20 14:01 04/01/21 14:34 04/28/21 09:17 06/11/21 09:42 07/03/21 10:32 08/20/21 10:06  M Protein SerPl Elph-Mcnc Not Observed g/dL 1.1 (H) (C) 1/61/09 60:45 Not Observed (C) 0.6 (H) (C) 0.7 (H) (C) 0.5 (H) (C) 0.7 (H) (C) 1.1 (H) (C)    Latest Reference Range & Units Most Recent 09/17/21 08:47 10/15/21 08:25 11/12/21 08:55  Kappa free light chain 3.3 - 19.4 mg/L 5.0 11/12/21 08:55 7.2 7.2 5.0  Lambda free light chains 5.7 - 26.3 mg/L 223.6 (H) 11/12/21 08:55 215.3 (H) 340.2 (H) 223.6 (H)  Kappa, lambda light chain ratio 0.26 - 1.65  0.02 (L) 11/12/21 08:55 0.03 (L) 0.02 (L) 0.02 (L)  (H): Data is abnormally high (L): Data is abnormally low  RADIOGRAPHIC STUDIES: I have personally reviewed the radiological images as listed and agreed with the findings in the report. No results found.   ASSESSMENT & PLAN:  Multiple myeloma not having achieved remission (HCC) # RECURRENT Multiple myeloma stage III [high-risk cytogenetics-status post KRD-VGPR ; status post autologous stem cell transplant on 06/15/18.  Most recently progressed on salvage therapy with daratumumab  lenalidomide [15 mg 3 w; 1-w] dexamethasone. plasma cell myeloma by  approximately 70% of the overall cellular marrow;  FEB 2024-lambda light  chain 284 Bone marrow-70% plasma cells. M Protein 0.9-rising lambda light chain ratio. Discontinue Revlimid; and dara-given the progressive disease.  MARCH 2024- PET scan: No skeletal lesions noted. APRIl 2024- MRI-cervical/thoracic/lumbar spine-negative for any lesions in the spinal cord. DISCONTINUEPomalyst;- carfilzomib-dexamethasone- [rising M-protein; MAY 2024- 1.3. ] start 12/27/2022-Isa-Carfil-Dex [[facility]]  #  12/27/2022-Isa-Dex; d-2carfil weekly 4 q 28 days  [[facility]] MARCH 2024-Echo [[facility]] Left ventricular EF 50 to 55%.  #  Partial response noted.   Proceed with cycle #3; day-8  today. Awaiting on 2 D echo- 8/13.   # Hypokalemia: mild-  continue Kdur TID; hypocalcemia- 8.0- vit D march 2024- 50- continie Ergo 50k weekly. REcommend ca BID stable.   # Iatrogenic hypothyroidism [ Graves/goiter s/p total thyroidectomy;aug,2022 ]-currently on Synthroid 100 mcg/day-MARCH 2024- TSH- 33; recommend  taking the pills fasting- stable. Awaiting repeat labs  # Lower back discomfort x 6 months-see above.  MRI lumbar spine as above.  Continue tramadol.  Stable.  # Bone lesions /last zometa on 11/27/2017.  S/p   Dental extraction PET scan evidence of any bone lesions.-Hold Zometa for now. stable.   # IV access: mediport- functional  # Vaccination: Flu shot s/p;  s/p Pneumonia vaccination [march 2024]- stable.   6 w- given dental extrac- on 1/26. :MM panel; K/l light chain ratio q 4 W D-isa-dex;car1&2-car; weekly x 4 q 28 day cycle  # DISPOSITION: # ADD TSH to labs today.   # as per IS-  chemo tomorrow # as per IS-  as planned # 1 week- MD labs-:MM panel; K/l light chain ratio; chemo; D-2 chemo - Dr.B   Orders Placed This Encounter  Procedures   TSH    Standing Status:   Future    Number of Occurrences:   1    Standing Expiration Date:   03/01/2024   All questions were answered. The patient knows to call the clinic with any problems, questions or concerns.      Earna Coder, MD 03/02/2023  10:05 AM

## 2023-03-03 ENCOUNTER — Inpatient Hospital Stay: Payer: 59

## 2023-03-03 VITALS — BP 147/96 | HR 57 | Temp 97.9°F | Resp 18

## 2023-03-03 DIAGNOSIS — C9000 Multiple myeloma not having achieved remission: Secondary | ICD-10-CM

## 2023-03-03 DIAGNOSIS — Z5112 Encounter for antineoplastic immunotherapy: Secondary | ICD-10-CM | POA: Diagnosis not present

## 2023-03-03 MED ORDER — SODIUM CHLORIDE 0.9 % IV SOLN
Freq: Once | INTRAVENOUS | Status: AC
  Filled 2023-03-03: qty 250

## 2023-03-03 MED ORDER — SODIUM CHLORIDE 0.9 % IV SOLN
20.0000 mg | Freq: Once | INTRAVENOUS | Status: AC
  Administered 2023-03-03: 20 mg via INTRAVENOUS
  Filled 2023-03-03: qty 20

## 2023-03-03 MED ORDER — DEXTROSE 5 % IV SOLN
56.0000 mg/m2 | Freq: Once | INTRAVENOUS | Status: AC
  Administered 2023-03-03: 100 mg via INTRAVENOUS
  Filled 2023-03-03: qty 30

## 2023-03-03 MED ORDER — HEPARIN SOD (PORK) LOCK FLUSH 100 UNIT/ML IV SOLN
500.0000 [IU] | Freq: Once | INTRAVENOUS | Status: AC | PRN
  Administered 2023-03-03: 500 [IU]
  Filled 2023-03-03: qty 5

## 2023-03-03 NOTE — Patient Instructions (Signed)
[facility] [place] [facility]  Discharge Instructions: Thank you for choosing [facility] to provide your oncology and hematology care.  If you have a lab appointment with the Cancer Center, please go directly to the Cancer Center and check in at the registration area.  Wear comfortable clothing and clothing appropriate for easy access to any Portacath or PICC line.   We strive to give you quality time with your provider. You may need to reschedule your appointment if you arrive late (15 or more minutes).  Arriving late affects you and other patients whose appointments are after yours.  Also, if you miss three or more appointments without notifying the office, you may be dismissed from the clinic at the provider's discretion.      For prescription refill requests, have your pharmacy contact our office and allow 72 hours for refills to be completed.    Today you received the following chemotherapy and/or immunotherapy agents Kyprolis      To help prevent nausea and vomiting after your treatment, we encourage you to take your nausea medication as directed.  BELOW ARE SYMPTOMS THAT SHOULD BE REPORTED IMMEDIATELY: *FEVER GREATER THAN 100.4 F (38 C) OR HIGHER *CHILLS OR SWEATING *NAUSEA AND VOMITING THAT IS NOT CONTROLLED WITH YOUR NAUSEA MEDICATION *UNUSUAL SHORTNESS OF BREATH *UNUSUAL BRUISING OR BLEEDING *URINARY PROBLEMS (pain or burning when urinating, or frequent urination) *BOWEL PROBLEMS (unusual diarrhea, constipation, pain near the anus) TENDERNESS IN MOUTH AND THROAT WITH OR WITHOUT PRESENCE OF ULCERS (sore throat, sores in mouth, or a toothache) UNUSUAL RASH, SWELLING OR PAIN  UNUSUAL VAGINAL DISCHARGE OR ITCHING   Items with * indicate a potential emergency and should be followed up as soon as possible or go to the Emergency Department if any problems should occur.  Please show the CHEMOTHERAPY ALERT CARD or IMMUNOTHERAPY ALERT CARD at check-in to  the Emergency Department and triage nurse.  Should you have questions after your visit or need to cancel or reschedule your appointment, please contact [facility] [place] [facility]  [phone] and follow the prompts.  Office hours are 8:00 a.m. to 4:30 p.m. Monday - Friday. Please note that voicemails left after 4:00 p.m. may not be returned until the following business day.  We are closed weekends and major holidays. You have access to a nurse at all times for urgent questions. Please call the main number to the clinic [phone] and follow the prompts.  For any non-urgent questions, you may also contact your provider using MyChart. We now offer e-Visits for anyone 18 and older to request care online for non-urgent symptoms. For details visit [email or web address].   Also download the MyChart app! Go to the app store, search "MyChart", open the app, select [facility], and log in with your MyChart username and password.    

## 2023-03-08 ENCOUNTER — Ambulatory Visit
Admission: RE | Admit: 2023-03-08 | Discharge: 2023-03-08 | Disposition: A | Discharge status: Home / Self Care | Payer: 59 | Source: Ambulatory Visit | Attending: Internal Medicine | Admitting: Internal Medicine

## 2023-03-08 DIAGNOSIS — Z5181 Encounter for therapeutic drug level monitoring: Secondary | ICD-10-CM

## 2023-03-08 DIAGNOSIS — I110 Hypertensive heart disease with heart failure: Secondary | ICD-10-CM | POA: Insufficient documentation

## 2023-03-08 DIAGNOSIS — C9000 Multiple myeloma not having achieved remission: Secondary | ICD-10-CM | POA: Insufficient documentation

## 2023-03-08 DIAGNOSIS — J449 Chronic obstructive pulmonary disease, unspecified: Secondary | ICD-10-CM | POA: Insufficient documentation

## 2023-03-08 DIAGNOSIS — I509 Heart failure, unspecified: Secondary | ICD-10-CM | POA: Insufficient documentation

## 2023-03-08 DIAGNOSIS — Z79899 Other long term (current) drug therapy: Secondary | ICD-10-CM | POA: Diagnosis not present

## 2023-03-08 DIAGNOSIS — I361 Nonrheumatic tricuspid (valve) insufficiency: Secondary | ICD-10-CM | POA: Diagnosis not present

## 2023-03-08 DIAGNOSIS — I371 Nonrheumatic pulmonary valve insufficiency: Secondary | ICD-10-CM | POA: Insufficient documentation

## 2023-03-08 LAB — ECHOCARDIOGRAM COMPLETE
AR max vel: 2.24 cm2
AV Area VTI: 2.27 cm2
AV Area mean vel: 2.12 cm2
AV Mean grad: 2 mmHg
AV Peak grad: 5.1 mmHg
Ao pk vel: 1.13 m/s
Area-P 1/2: 2.43 cm2
Calc EF: 50.1 %
MV VTI: 1.78 cm2
Radius: 0.4 cm
S' Lateral: 3.7 cm
Single Plane A2C EF: 50.7 %
Single Plane A4C EF: 51.5 %

## 2023-03-08 NOTE — Progress Notes (Signed)
*  PRELIMINARY RESULTS* Echocardiogram 2D Echocardiogram has been performed.  Carolyne Fiscal 03/08/2023, 11:08 AM

## 2023-03-09 ENCOUNTER — Ambulatory Visit: Payer: 59 | Admitting: Internal Medicine

## 2023-03-09 ENCOUNTER — Ambulatory Visit: Payer: 59

## 2023-03-09 ENCOUNTER — Inpatient Hospital Stay: Payer: 59

## 2023-03-09 ENCOUNTER — Encounter: Payer: Self-pay | Admitting: Internal Medicine

## 2023-03-09 ENCOUNTER — Inpatient Hospital Stay (HOSPITAL_BASED_OUTPATIENT_CLINIC_OR_DEPARTMENT_OTHER): Payer: 59 | Admitting: Internal Medicine

## 2023-03-09 VITALS — BP 154/86 | HR 76 | Temp 98.9°F | Ht 62.0 in | Wt 175.2 lb

## 2023-03-09 DIAGNOSIS — C9000 Multiple myeloma not having achieved remission: Secondary | ICD-10-CM

## 2023-03-09 DIAGNOSIS — Z5112 Encounter for antineoplastic immunotherapy: Secondary | ICD-10-CM | POA: Diagnosis not present

## 2023-03-09 LAB — CBC WITH DIFFERENTIAL (CANCER CENTER ONLY)
Abs Immature Granulocytes: 0.01 10*3/uL (ref 0.00–0.07)
Basophils Absolute: 0 10*3/uL (ref 0.0–0.1)
Basophils Relative: 0 %
Eosinophils Absolute: 0 10*3/uL (ref 0.0–0.5)
Eosinophils Relative: 1 %
HCT: 30.2 % — ABNORMAL LOW (ref 36.0–46.0)
Hemoglobin: 9.3 g/dL — ABNORMAL LOW (ref 12.0–15.0)
Immature Granulocytes: 0 %
Lymphocytes Relative: 32 %
Lymphs Abs: 1.1 10*3/uL (ref 0.7–4.0)
MCH: 30 pg (ref 26.0–34.0)
MCHC: 30.8 g/dL (ref 30.0–36.0)
MCV: 97.4 fL (ref 80.0–100.0)
Monocytes Absolute: 0.3 10*3/uL (ref 0.1–1.0)
Monocytes Relative: 8 %
Neutro Abs: 1.9 10*3/uL (ref 1.7–7.7)
Neutrophils Relative %: 59 %
Platelet Count: 87 10*3/uL — ABNORMAL LOW (ref 150–400)
RBC: 3.1 MIL/uL — ABNORMAL LOW (ref 3.87–5.11)
RDW: 16.3 % — ABNORMAL HIGH (ref 11.5–15.5)
WBC Count: 3.3 10*3/uL — ABNORMAL LOW (ref 4.0–10.5)
nRBC: 0.6 % — ABNORMAL HIGH (ref 0.0–0.2)

## 2023-03-09 LAB — CMP (CANCER CENTER ONLY)
ALT: 13 U/L (ref 0–44)
AST: 15 U/L (ref 15–41)
Albumin: 3.7 g/dL (ref 3.5–5.0)
Alkaline Phosphatase: 59 U/L (ref 38–126)
Anion gap: 7 (ref 5–15)
BUN: 17 mg/dL (ref 8–23)
CO2: 21 mmol/L — ABNORMAL LOW (ref 22–32)
Calcium: 7.9 mg/dL — ABNORMAL LOW (ref 8.9–10.3)
Chloride: 112 mmol/L — ABNORMAL HIGH (ref 98–111)
Creatinine: 0.7 mg/dL (ref 0.44–1.00)
GFR, Estimated: >60 mL/min (ref >60)
Glucose, Bld: 89 mg/dL (ref 70–99)
Potassium: 3.4 mmol/L — ABNORMAL LOW (ref 3.5–5.1)
Sodium: 140 mmol/L (ref 135–145)
Total Bilirubin: 0.7 mg/dL (ref 0.3–1.2)
Total Protein: 6 g/dL — ABNORMAL LOW (ref 6.5–8.1)

## 2023-03-09 MED ORDER — DIPHENHYDRAMINE HCL 50 MG/ML IJ SOLN
50.0000 mg | Freq: Once | INTRAMUSCULAR | Status: AC
  Administered 2023-03-09: 50 mg via INTRAVENOUS
  Filled 2023-03-09: qty 1

## 2023-03-09 MED ORDER — SODIUM CHLORIDE 0.9 % IV SOLN
20.0000 mg | Freq: Once | INTRAVENOUS | Status: AC
  Administered 2023-03-09: 20 mg via INTRAVENOUS
  Filled 2023-03-09: qty 20

## 2023-03-09 MED ORDER — SODIUM CHLORIDE 0.9 % IV SOLN
Freq: Once | INTRAVENOUS | Status: AC
  Filled 2023-03-09: qty 250

## 2023-03-09 MED ORDER — SODIUM CHLORIDE 0.9 % IV SOLN
10.0000 mg/kg | Freq: Once | INTRAVENOUS | Status: AC
  Administered 2023-03-09: 800 mg via INTRAVENOUS
  Filled 2023-03-09: qty 25

## 2023-03-09 MED ORDER — HEPARIN SOD (PORK) LOCK FLUSH 100 UNIT/ML IV SOLN
500.0000 [IU] | Freq: Once | INTRAVENOUS | Status: AC | PRN
  Administered 2023-03-09: 500 [IU]
  Filled 2023-03-09: qty 5

## 2023-03-09 MED ORDER — SODIUM CHLORIDE 0.9% FLUSH
10.0000 mL | INTRAVENOUS | Status: DC | PRN
  Administered 2023-03-09: 10 mL
  Filled 2023-03-09: qty 10

## 2023-03-09 MED ORDER — FAMOTIDINE IN NACL 20-0.9 MG/50ML-% IV SOLN
20.0000 mg | Freq: Once | INTRAVENOUS | Status: AC
  Administered 2023-03-09: 20 mg via INTRAVENOUS
  Filled 2023-03-09: qty 50

## 2023-03-09 MED ORDER — DEXTROSE 5 % IV SOLN
56.0000 mg/m2 | Freq: Once | INTRAVENOUS | Status: AC
  Administered 2023-03-09: 100 mg via INTRAVENOUS
  Filled 2023-03-09: qty 30

## 2023-03-09 MED ORDER — ACETAMINOPHEN 325 MG PO TABS
650.0000 mg | ORAL_TABLET | Freq: Once | ORAL | Status: AC
  Administered 2023-03-09: 650 mg via ORAL
  Filled 2023-03-09: qty 2

## 2023-03-09 MED FILL — Dexamethasone Sodium Phosphate Inj 100 MG/10ML: INTRAMUSCULAR | Qty: 2 | Status: AC

## 2023-03-09 NOTE — Progress Notes (Signed)
Had an echo done at [facility] yesterday.  Pt states sometimes it feels like something is stuck on the bottom of her rt foot.  Some trouble sleeping but not taking a sleep aid.

## 2023-03-09 NOTE — Patient Instructions (Signed)
[facility]  Discharge Instructions: Thank you for choosing [facility] to provide your oncology and hematology care.  If you have a lab appointment with the Cancer Center, please go directly to the Cancer Center and check in at the registration area.  Wear comfortable clothing and clothing appropriate for easy access to any Portacath or PICC line.   We strive to give you quality time with your provider. You may need to reschedule your appointment if you arrive late (15 or more minutes).  Arriving late affects you and other patients whose appointments are after yours.  Also, if you miss three or more appointments without notifying the office, you may be dismissed from the clinic at the provider's discretion.      For prescription refill requests, have your pharmacy contact our office and allow 72 hours for refills to be completed.    Today you received the following chemotherapy and/or immunotherapy agents: carfilzomib/ isatuximab    To help prevent nausea and vomiting after your treatment, we encourage you to take your nausea medication as directed.  BELOW ARE SYMPTOMS THAT SHOULD BE REPORTED IMMEDIATELY: *FEVER GREATER THAN 100.4 F (38 C) OR HIGHER *CHILLS OR SWEATING *NAUSEA AND VOMITING THAT IS NOT CONTROLLED WITH YOUR NAUSEA MEDICATION *UNUSUAL SHORTNESS OF BREATH *UNUSUAL BRUISING OR BLEEDING *URINARY PROBLEMS (pain or burning when urinating, or frequent urination) *BOWEL PROBLEMS (unusual diarrhea, constipation, pain near the anus) TENDERNESS IN MOUTH AND THROAT WITH OR WITHOUT PRESENCE OF ULCERS (sore throat, sores in mouth, or a toothache) UNUSUAL RASH, SWELLING OR PAIN  UNUSUAL VAGINAL DISCHARGE OR ITCHING   Items with * indicate a potential emergency and should be followed up as soon as possible or go to the Emergency Department if any problems should occur.  Please show the CHEMOTHERAPY ALERT CARD or IMMUNOTHERAPY ALERT CARD  at check-in to the Emergency Department and triage nurse.  Should you have questions after your visit or need to cancel or reschedule your appointment, please contact [facility]  [phone] and follow the prompts.  Office hours are 8:00 a.m. to 4:30 p.m. Monday - Friday. Please note that voicemails left after 4:00 p.m. may not be returned until the following business day.  We are closed weekends and major holidays. You have access to a nurse at all times for urgent questions. Please call the main number to the clinic [phone] and follow the prompts.  For any non-urgent questions, you may also contact your provider using MyChart. We now offer e-Visits for anyone 43 and older to request care online for non-urgent symptoms. For details visit [email or web address].   Also download the MyChart app! Go to the app store, search "MyChart", open the app, select , and log in with your MyChart username and password.

## 2023-03-09 NOTE — Assessment & Plan Note (Addendum)
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  Most recently progressed on salvage therapy with daratumumab  lenalidomide [15 mg 3 w; 1-w] dexamethasone. plasma cell myeloma by approximately 70% of the overall cellular marrow;  FEB 2024-lambda light chain 284 Bone marrow-70% plasma cells. M Protein 0.9-rising lambda light chain ratio. Discontinue Revlimid; and dara-given the progressive disease.  MARCH 2024- PET scan: No skeletal lesions noted. APRIl 2024- MRI-cervical/thoracic/lumbar spine-negative for any lesions in the spinal cord. DISCONTINUEPomalyst;- carfilzomib-dexamethasone- [rising M-protein; MAY 2024- 1.3. ] start 12/27/2022-Isa-Carfil-Dex [[facility]]  #  12/27/2022-Isa-Dex; d-2carfil weekly 4 q 28 days  [[facility]] MARCH 2024-Echo [[facility]] Left ventricular EF 50 to 55%; AUG 13th, 2024- EF-45-50%.  #  Partial response noted.  Proceed with cycle #3; day-15  today.   # Hypokalemia: mild-  continue Kdur TID; hypocalcemia- 8.0- vit D march 2024- 50- continie Ergo 50k weekly. REcommend ca BID stable.   # Iatrogenic hypothyroidism [ Graves/goiter s/p total thyroidectomy;aug,2022 ]-currently on Synthroid 150 mcg/day-[increased in AUG ]2024-  again recommend  taking the pills fasting- stable. Will repeat labs in sep 2024.   # Lower back discomfort x 6 months-see above.  MRI lumbar spine as above.  Continue tramadol.  Stable.  # Bone lesions /last zometa on 11/27/2017.  S/p   Dental extraction PET scan evidence of any bone lesions.-Hold Zometa for now. stable.   # IV access: mediport- functional  # Vaccination: Flu shot s/p;  s/p Pneumonia vaccination [march 2024]- stable.   6 w- given dental extrac- on 1/26. :MM panel; K/l light chain ratio q 4 W D-isa-dex;car1&2-car; weekly x 3  q 28 day cycle  # DISPOSITION: # as per IS-  chemo tomorrow # as per IS-  as planned # 2 week- MD labs-:D-1 isa-dex-car D-2 car # 3  week- MD labs-:MM panel; K/l light chain ratio;D-1 isa-dex-car D-2 car-Dr.B

## 2023-03-09 NOTE — Progress Notes (Signed)
Treatment today ok with PLT 87

## 2023-03-09 NOTE — Progress Notes (Signed)
[facility] OFFICE PROGRESS NOTE  Patient Care Team: Leanna Sato, MD as PCP - General (Family Medicine) End, Cristal Deer, MD as PCP - Cardiology (Cardiology) Eddie Candle, [name] Earna Coder, MD as Consulting Physician (Hematology and Oncology) Vida Rigger, MD as Consulting Physician (Pulmonary Disease) Tedd Sias Marlana Salvage, MD as Consulting Physician (Endocrinology)   Cancer Staging  No matching staging information was found for the patient.   Oncology History Overview Note  # SEP 2018- MULTIPLE MYELOMA IgALamda [2.5 gm/dl; K/L= 40/9811]; STAGE III [beta 2 microglobulin=5.5] [presented with acute renal failure; anemia; NO hypercalcemia; Skeletal survey-Normal]; BMBx- 45% plasma cells; FISH-POSITIVE 11:14 translocation.[STANDARD-high RISK]/cyto-Normal; SEP 2018- PET- L3 posterior element lesion.   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 No morphologic support for residual myeloma disease. Negative for minimal residual disease by MM-MRD flow cytometry; HOLD REVLIMID [leg swelling]; AUG 2021-PET scan negative for myeloma; continue to hold Revlimid  # MARCH 2021-diastolic congestive heart failure [Dr.End]  # BMBx- OCT 2021- BMBx- 5% plasmacytosis-however this appears to be more polyclonal rather than monoclonal-not explain patient worsening anemia.   # OCT 2022- 18th- Dara- Rev-Dex; MARCH, 2024- PROGRESSION bone marrow biopsy  70% plasma cells; PET scan no bone lesions-however conus medullaris uptake-MRI lumbar spine-   # MAY 2024- DISCONTINUE carfilzomib-dexamethasone- Pomalyst; [rising M-protein; MAY 2024- 1.3. ]   # start 12/27/2022 -Isa-Carfil-Dex   #November 2021-hyperthyroidism/goiter- [D.Bennett/Dr.Solum]-methimazole. S/p Thyroidectomy [Dr.Kim; [facility]-AUG 2022]  --------------------------  # 12/12- RIGHT JUGULAR DVT-x 91m on xarelto; finished April 2020; September 2020-EGD/dysphagia; Dr. Tobi Bastos  # Acute renal failure [Dr.Singh; Proteinuria 1.5gm/day ]; acyclovir/Asprin ------------------------------------------------------------------------------------------------------------   DIAGNOSIS: [ ]  MULTIPLE MYELOMA  STAGE: III/HIGH RISK ;GOALS: CONTROL      Multiple myeloma not having achieved remission (HCC)  11/21/2017 - 04/28/2018 Chemotherapy   Patient is on Treatment Plan : MYELOMA SALVAGE Cyclophosphamide / Carfilzomib / Dexamethasone (CCd) q28d     05/12/2021 - 02/25/2022 Chemotherapy   Patient is on Treatment Plan : MYELOMA RELAPSED REFRACTORY Daratumumab SQ + Lenalidomide + Dexamethasone (DaraRd) q28d     05/12/2021 - 07/23/2022 Chemotherapy   Patient is on Treatment Plan : MYELOMA RELAPSED REFRACTORY Daratumumab SQ + Lenalidomide + Dexamethasone (DaraRd) q28d     11/02/2022 - 11/16/2022 Chemotherapy   Patient is on Treatment Plan : MYELOMA RELAPSED/ REFRACTORY Carfilzomib D1,8,15 (20/27) + Pomalidomide + Dexamethasone (KPd) q28d     12/27/2022 -  Chemotherapy   Patient is on Treatment Plan : MYELOMA Carfilzomib + Dexamethasone + Isatuximab q28d      INTERVAL HISTORY: Ambulating  independently.   Alone.  Brandi [name] 72 y.o.  female pleasant patient above history of relapsed multiple myeloma--currently  [name]- Carfil-Dex is here for a follow up/ and review the results of the 2d echo done at [facility] yesterday.   Pt states sometimes it feels like something is stuck on the bottom of her rt foot.   Some  trouble sleeping but not taking a sleep aid.   Cough improved;  mild swelling in legs.  No  infections. Denies any worsening pain.  Patient taking tramadol as needed for leg cramps.    Review of Systems  Constitutional:  Positive for malaise/fatigue. Negative for chills, diaphoresis and fever.  HENT:  Negative for nosebleeds and sore throat.   Eyes:  Negative for double vision.  Respiratory:  Negative for hemoptysis.   Cardiovascular:  Negative for chest pain, palpitations and orthopnea.  Gastrointestinal:  Negative for abdominal pain, blood in stool, constipation, heartburn and melena.  Genitourinary:  Negative for dysuria, frequency and urgency.  Musculoskeletal:  Positive for back pain.  Skin: Negative.  Negative for itching and rash.  Neurological:  Negative for dizziness, tingling, focal weakness, weakness and headaches.  Endo/Heme/Allergies:  Does not bruise/bleed easily.  Psychiatric/Behavioral:  Negative for depression. The patient is not nervous/anxious.     PAST MEDICAL HISTORY :  Past Medical History:  Diagnosis Date   (HFpEF) heart failure with preserved ejection fraction (HCC)    a. 06/2020 Echo: EF 60-65%, no rwma, nl RV size/fxn. Triv MR. Triv TR/AI. Mod elev PASP.   Anxiety    COPD (chronic obstructive pulmonary disease) (HCC)    GERD (gastroesophageal reflux disease)    Hypertension    Hyperthyroidism    Hypokalemia    IgA myeloma (HCC)    Multiple myeloma (HCC)    Pneumonia    Red blood cell antibody positive, compatible PRBC difficult to obtain    S/P autologous bone marrow transplantation (HCC)     PAST SURGICAL HISTORY :   Past Surgical History:  Procedure Laterality Date   ANTERIOR VITRECTOMY Left 10/07/2021   Procedure: ANTERIOR VITRECTOMY;  Surgeon: Lockie Mola, MD;  Location: [facility];  Service: Ophthalmology;  Laterality: Left;   CATARACT EXTRACTION W/PHACO Left 10/07/2021   Procedure: CATARACT EXTRACTION PHACO AND INTRAOCULAR LENS  PLACEMENT (IOC) LEFT VISION BLUE;  Surgeon: Lockie Mola, MD;  Location: [facility];  Service: Ophthalmology;  Laterality: Left;  kit for manual incision 14.56 01:21.4   CATARACT EXTRACTION W/PHACO Right 10/21/2021   Procedure: CATARACT EXTRACTION PHACO AND INTRAOCULAR LENS PLACEMENT (IOC) RIGHT 11.12 01:48.1;  Surgeon: Lockie Mola, MD;  Location: [facility];  Service: Ophthalmology;  Laterality: Right;   ESOPHAGOGASTRODUODENOSCOPY (EGD) WITH PROPOFOL N/A 03/30/2019   Procedure: ESOPHAGOGASTRODUODENOSCOPY (EGD) WITH PROPOFOL;  Surgeon: Wyline Mood, MD;  Location: [facility];  Service: Gastroenterology;  Laterality: N/A;   IR FLUORO GUIDE PORT INSERTION RIGHT  12/16/2017   IR IMAGING GUIDED PORT INSERTION  06/15/2021   KNEE ARTHROSCOPY Left 09/03/2022   Procedure: ARTHROSCOPY KNEE DEBRIDEMENT;  Surgeon: Lyndle Herrlich, MD;  Location: [facility] OR[facility];  Service: Orthopedics;  Laterality: Left;   THYROIDECTOMY N/A     FAMILY HISTORY :   Family History  Problem Relation Age of Onset   Pneumonia Mother    Seizures Father     SOCIAL HISTORY:   Social History   Tobacco Use   Smoking status: Former    Current packs/day: 0.00    Types: Cigarettes    Quit date: 03/18/2021    Years since quitting: 1.9   Smokeless tobacco: Never   Tobacco comments:    2 cigarettes QOD  Vaping Use   Vaping status: Never Used  Substance Use Topics   Alcohol use: No   Drug use: No    ALLERGIES:  has No Known Allergies.  MEDICATIONS:  Current Outpatient Medications  Medication Sig Dispense Refill   acyclovir (ZOVIRAX) 400 MG tablet Take 1 tablet by mouth twice daily 60 tablet 0  albuterol (VENTOLIN HFA) 108 (90 Base) MCG/ACT inhaler Inhale 2 puffs into the lungs every 6 (six) hours as needed for wheezing or shortness of breath. 8 g 3   aspirin EC 81 MG EC tablet Take 1 tablet (81 mg total) by mouth daily. 90 tablet 3   calcium-vitamin D (OSCAL WITH D) 500MG -200UNIT ( )  tablet Take 1 tablet by mouth 2 (two) times daily. 60 tablet 0   carvedilol (COREG) 3.125 MG tablet Take 1 tablet (3.125 mg total) by mouth 2 (two) times daily with a meal. 60 tablet 5   furosemide (LASIX) 40 MG tablet Take 1 tablet (40 mg total) by mouth daily. 90 tablet 3   levothyroxine (SYNTHROID) 150 MCG tablet Take 1 tablet (150 mcg total) by mouth daily before breakfast. Take before eating or drinking anything. DO Not  eat or drink until after an hour later after taking medication. 30 tablet 3   lidocaine-prilocaine (EMLA) cream Apply 1 application topically as needed. Apply to port and cover with saran wrap 1-2 hours prior to port access 30 g 1   magnesium oxide (MAG-OX) 400 (240 Mg) MG tablet Take 1 tablet (400 mg total) by mouth 2 (two) times daily. 60 tablet 3   methylPREDNISolone (MEDROL DOSEPAK) 4 MG TBPK tablet Use as directed. 21 tablet 1   montelukast (SINGULAIR) 10 MG tablet Take 1 tablet (10 mg total) by mouth at bedtime. 30 tablet 3   ondansetron (ZOFRAN) 8 MG tablet One pill every 8 hours as needed for nausea/vomitting. 40 tablet 1   polyethylene glycol (MIRALAX / GLYCOLAX) packet Take 17 g by mouth daily as needed for mild constipation. 14 each 0   pomalidomide (POMALYST) 4 MG capsule Take 1 capsule (4 mg total) by mouth daily. Take for 21 days, then old for 7 days. Repeat every 28 days. 21 capsule 0   potassium chloride SA (KLOR-CON M) 20 MEQ tablet Take 1 tablet (20 mEq total) by mouth 2 (two) times daily. 60 tablet 6   spironolactone (ALDACTONE) 25 MG tablet Take 1 tablet by mouth once daily 90 tablet 3   traMADol (ULTRAM) 50 MG tablet Take 1 tablet (50 mg total) by mouth every 12 (twelve) hours as needed. 60 tablet 0   Vitamin D, Ergocalciferol, (DRISDOL) 1.25 MG (50000 UNIT) CAPS capsule Take 1 capsule (50,000 Units total) by mouth once a week. 12 capsule 0   pantoprazole (PROTONIX) 40 MG tablet Take 1 tablet (40 mg total) by mouth daily. 90 tablet 0   No current  facility-administered medications for this visit.   Facility-Administered Medications Ordered in Other Visits  Medication Dose Route Frequency Provider Last Rate Last Admin   0.9 %  sodium chloride infusion   Intravenous Continuous Ralene Muskrat, PA-C        PHYSICAL EXAMINATION:   BP (!) 154/86 (BP Location: Left Arm, Patient Position: Sitting, Cuff Size: Normal)   Pulse 76   Temp 98.9 F (37.2 C) (Tympanic)   Ht 5\' 2"  (1.575 m)   Wt 175 lb 3.2 oz (79.5 kg)   SpO2 100%   BMI 32.04 kg/m   Filed Weights   03/09/23 0853  Weight: 175 lb 3.2 oz (79.5 kg)   Mild leg swelling.   Physical Exam HENT:     Head: Normocephalic and atraumatic.  Eyes:     Pupils: Pupils are equal, round, and reactive to light.  Cardiovascular:     Rate and Rhythm: Normal rate and regular rhythm.  Pulmonary:  Effort: Pulmonary effort is normal. No respiratory distress.     Breath sounds: Normal breath sounds. No wheezing.  Abdominal:     General: Bowel sounds are normal. There is no distension.     Palpations: Abdomen is soft. There is no mass.     Tenderness: There is no abdominal tenderness. There is no guarding or rebound.  Musculoskeletal:        General: No tenderness. Normal range of motion.     Cervical back: Normal range of motion and neck supple.  Skin:    General: Skin is warm.  Neurological:     Mental Status: She is alert and oriented to person, place, and time.  Psychiatric:        Mood and Affect: Affect normal.    LABORATORY DATA:  I have reviewed the data as listed    Component Value Date/Time   NA 138 03/02/2023 0821   NA 141 01/25/2020 1530   K 3.3 (L) 03/02/2023 0821   CL 111 03/02/2023 0821   CO2 21 (L) 03/02/2023 0821   GLUCOSE 101 (H) 03/02/2023 0821   BUN 20 03/02/2023 0821   BUN 19 01/25/2020 1530   CREATININE 0.78 03/02/2023 0821   CALCIUM 8.0 (L) 03/02/2023 0821   PROT 6.4 (L) 03/02/2023 0821   ALBUMIN 4.1 03/02/2023 0821   AST 13 (L) 03/02/2023 0821    ALT 10 03/02/2023 0821   ALKPHOS 54 03/02/2023 0821   BILITOT 0.4 03/02/2023 0821   GFRNONAA >60 03/02/2023 0821   GFRAA NOT CALCULATED 04/17/2020 1352    No results found for: "SPEP", "UPEP"  Lab Results  Component Value Date   WBC 3.3 (L) 03/09/2023   NEUTROABS 1.9 03/09/2023   HGB 9.3 (L) 03/09/2023   HCT 30.2 (L) 03/09/2023   MCV 97.4 03/09/2023   PLT 87 (L) 03/09/2023      Chemistry      Component Value Date/Time   NA 138 03/02/2023 0821   NA 141 01/25/2020 1530   K 3.3 (L) 03/02/2023 0821   CL 111 03/02/2023 0821   CO2 21 (L) 03/02/2023 0821   BUN 20 03/02/2023 0821   BUN 19 01/25/2020 1530   CREATININE 0.78 03/02/2023 0821      Component Value Date/Time   CALCIUM 8.0 (L) 03/02/2023 0821   ALKPHOS 54 03/02/2023 0821   AST 13 (L) 03/02/2023 0821   ALT 10 03/02/2023 0821   BILITOT 0.4 03/02/2023 0821      (H): Data is abnormally high   Latest Reference Range & Units Most Recent 12/24/20 14:01 04/01/21 14:34 04/28/21 09:17 06/11/21 09:42 07/03/21 10:32 08/20/21 10:06  M Protein SerPl Elph-Mcnc Not Observed g/dL 1.1 (H) (C) 09/07/06 65:78 Not Observed (C) 0.6 (H) (C) 0.7 (H) (C) 0.5 (H) (C) 0.7 (H) (C) 1.1 (H) (C)    Latest Reference Range & Units Most Recent 09/17/21 08:47 10/15/21 08:25 11/12/21 08:55  Kappa free light chain 3.3 - 19.4 mg/L 5.0 11/12/21 08:55 7.2 7.2 5.0  Lambda free light chains 5.7 - 26.3 mg/L 223.6 (H) 11/12/21 08:55 215.3 (H) 340.2 (H) 223.6 (H)  Kappa, lambda light chain ratio 0.26 - 1.65  0.02 (L) 11/12/21 08:55 0.03 (L) 0.02 (L) 0.02 (L)  (H): Data is abnormally high (L): Data is abnormally low  RADIOGRAPHIC STUDIES: I have personally reviewed the radiological images as listed and agreed with the findings in the report. ECHOCARDIOGRAM COMPLETE  Result Date: 03/08/2023    ECHOCARDIOGRAM REPORT   Patient Name:  [name] Date of Exam: 03/08/2023 Medical Rec #:  [id number]         Height:       62.0 in Accession #:     [id number]        Weight:       172.0 lb Date of Birth:  05-10-1951         BSA:          1.793 m Patient Age:    72 years          BP:           147/96 mmHg Patient Gender: F                 HR:           57 bpm. Exam Location:  [facility] Procedure: 2D Echo, 3D Echo, Cardiac Doppler, Color Doppler and Strain Analysis Indications:     Chemo  History:         Patient has prior history of Echocardiogram examinations, most                  recent 09/07/2022. CHF, COPD; Risk Factors:Hypertension.                  Multiple myeloma not having achieved remission.  Sonographer:     Mikki Harbor Referring Phys:  [id number] Earna Coder Diagnosing Phys: Yvonne Kendall MD  Sonographer Comments: Global longitudinal strain was attempted. IMPRESSIONS  1. Left ventricular ejection fraction, by estimation, is 45 to 50%. The left ventricle has mildly decreased function. The left ventricle demonstrates global hypokinesis. Left ventricular diastolic parameters are consistent with Grade I diastolic dysfunction (impaired relaxation). The average left ventricular global longitudinal strain is -13.3 %. The global longitudinal strain is abnormal.  2. Right ventricular systolic function is normal. The right ventricular size is mildly enlarged. There is normal pulmonary artery systolic pressure.  3. The mitral valve is normal in structure. Mild to moderate mitral valve regurgitation. No evidence of mitral stenosis.  4. Tricuspid valve regurgitation is moderate.  5. The aortic valve is tricuspid. Aortic valve regurgitation is trivial. No aortic stenosis is present.  6. Pulmonic valve regurgitation is moderate.  7. The inferior vena cava is normal in size with greater than 50% respiratory variability, suggesting right atrial pressure of 3 mmHg. FINDINGS  Left Ventricle: Left ventricular ejection fraction, by estimation, is 45 to 50%. The left ventricle has mildly decreased function. The left ventricle demonstrates global hypokinesis. The  average left ventricular global longitudinal strain is -13.3 %. The global longitudinal strain is abnormal. The left ventricular internal cavity size was normal in size. There is no left ventricular hypertrophy. Left ventricular diastolic parameters are consistent with Grade I diastolic dysfunction (impaired relaxation). Right Ventricle: The right ventricular size is mildly enlarged. No increase in right ventricular wall thickness. Right ventricular systolic function is normal. There is normal pulmonary artery systolic pressure. The tricuspid regurgitant velocity is 2.42  m/s, and with an assumed right atrial pressure of 3 mmHg, the estimated right ventricular systolic pressure is 26.4 mmHg. Left Atrium: Left atrial size was normal in size. Right Atrium: Right atrial size was normal in size. Pericardium: There is no evidence of pericardial effusion. Mitral Valve: The mitral valve is normal in structure. Mild to moderate mitral valve regurgitation. No evidence of mitral valve stenosis. MV peak gradient, 3.4 mmHg. The mean mitral valve gradient is 1.0 mmHg. Tricuspid Valve: The tricuspid valve is not well  visualized. Tricuspid valve regurgitation is moderate. Aortic Valve: The aortic valve is tricuspid. Aortic valve regurgitation is trivial. No aortic stenosis is present. Aortic valve mean gradient measures 2.0 mmHg. Aortic valve peak gradient measures 5.1 mmHg. Aortic valve area, by VTI measures 2.27 cm. Pulmonic Valve: The pulmonic valve was normal in structure. Pulmonic valve regurgitation is moderate. No evidence of pulmonic stenosis. Aorta: The aortic root is normal in size and structure. Venous: The inferior vena cava is normal in size with greater than 50% respiratory variability, suggesting right atrial pressure of 3 mmHg. IAS/Shunts: No atrial level shunt detected by color flow Doppler.  LEFT VENTRICLE PLAX 2D LVIDd:         5.00 cm     Diastology LVIDs:         3.70 cm     LV e' medial:    6.20 cm/s LV PW:          0.78 cm     LV E/e' medial:  10.4 LV IVS:        0.90 cm     LV e' lateral:   7.29 cm/s LVOT diam:     1.90 cm     LV E/e' lateral: 8.8 LV SV:         58 LV SV Index:   32          2D Longitudinal Strain LVOT Area:     2.84 cm    2D Strain GLS Avg:     -13.3 %  LV Volumes (MOD) LV vol d, MOD A2C: 76.5 ml LV vol d, MOD A4C: 87.7 ml LV vol s, MOD A2C: 37.7 ml LV vol s, MOD A4C: 42.5 ml LV SV MOD A2C:     38.8 ml LV SV MOD A4C:     87.7 ml LV SV MOD BP:      41.3 ml RIGHT VENTRICLE RV Basal diam:  3.90 cm RV Mid diam:    3.00 cm RV S prime:     13.40 cm/s TAPSE (M-mode): 2.3 cm LEFT ATRIUM             Index        RIGHT ATRIUM           Index LA diam:        3.30 cm 1.84 cm/m   RA Area:     16.50 cm LA Vol (A2C):   58.8 ml 32.79 ml/m  RA Volume:   42.60 ml  23.76 ml/m LA Vol (A4C):   42.6 ml 23.76 ml/m LA Biplane Vol: 51.1 ml 28.50 ml/m  AORTIC VALVE                    PULMONIC VALVE AV Area (Vmax):    2.24 cm     PV Vmax:       0.77 m/s AV Area (Vmean):   2.12 cm     PV Peak grad:  2.4 mmHg AV Area (VTI):     2.27 cm AV Vmax:           113.00 cm/s AV Vmean:          71.400 cm/s AV VTI:            0.253 m AV Peak Grad:      5.1 mmHg AV Mean Grad:      2.0 mmHg LVOT Vmax:         89.40 cm/s LVOT Vmean:  53.500 cm/s LVOT VTI:          0.203 m LVOT/AV VTI ratio: 0.80  AORTA Ao Root diam: 3.30 cm MITRAL VALVE               TRICUSPID VALVE MV Area (PHT): 2.43 cm    TR Peak grad:   23.4 mmHg MV Area VTI:   1.78 cm    TR Vmax:        242.00 cm/s MV Peak grad:  3.4 mmHg MV Mean grad:  1.0 mmHg    SHUNTS MV Vmax:       0.92 m/s    Systemic VTI:  0.20 m MV Vmean:      47.8 cm/s   Systemic Diam: 1.90 cm MV Decel Time: 312 msec MR PISA:        1.01 cm MR PISA Radius: 0.40 cm MV E velocity: 64.50 cm/s MV A velocity: 74.90 cm/s MV E/A ratio:  0.86 Christopher End MD Electronically signed by Yvonne Kendall MD Signature Date/Time: 03/08/2023/5:21:35 PM    Final      ASSESSMENT & PLAN:  Multiple myeloma not  having achieved remission (HCC) # RECURRENT Multiple myeloma stage III [high-risk cytogenetics-status post KRD-VGPR ; status post autologous stem cell transplant on 06/15/18.  Most recently progressed on salvage therapy with daratumumab  lenalidomide [15 mg 3 w; 1-w] dexamethasone. plasma cell myeloma by approximately 70% of the overall cellular marrow;  FEB 2024-lambda light chain 284 Bone marrow-70% plasma cells. M Protein 0.9-rising lambda light chain ratio. Discontinue Revlimid; and dara-given the progressive disease.  MARCH 2024- PET scan: No skeletal lesions noted. APRIl 2024- MRI-cervical/thoracic/lumbar spine-negative for any lesions in the spinal cord. DISCONTINUEPomalyst;- carfilzomib-dexamethasone- [rising M-protein; MAY 2024- 1.3. ] start 12/27/2022-Isa-Carfil-Dex [[facility]]  #  12/27/2022-Isa-Dex; d-2carfil weekly 4 q 28 days  [[facility]] MARCH 2024-Echo [[facility]] Left ventricular EF 50 to 55%; AUG 13th, 2024- EF-45-50%.  #  Partial response noted.   Proceed with cycle #3; day-8  today.   # Hypokalemia: mild-  continue Kdur TID; hypocalcemia- 8.0- vit D march 2024- 50- continie Ergo 50k weekly. REcommend ca BID stable.   # Iatrogenic hypothyroidism [ Graves/goiter s/p total thyroidectomy;aug,2022 ]-currently on Synthroid 100 mcg/day-MARCH 2024- TSH- 33; recommend  taking the pills fasting- stable. Awaiting repeat labs  # Lower back discomfort x 6 months-see above.  MRI lumbar spine as above.  Continue tramadol.  Stable.  # Bone lesions /last zometa on 11/27/2017.  S/p   Dental extraction PET scan evidence of any bone lesions.-Hold Zometa for now. stable.   # IV access: mediport- functional  # Vaccination: Flu shot s/p;  s/p Pneumonia vaccination [march 2024]- stable.   6 w- given dental extrac- on 1/26. :MM panel; K/l light chain ratio q 4 W D-isa-dex;car1&2-car; weekly x 4 q 28 day cycle  # DISPOSITION: # ADD TSH to labs today.   # as per IS-  chemo tomorrow # as per IS-  as planned # 1 week-  MD labs-:MM panel; K/l light chain ratio; chemo; D-2 chemo - Dr.B   No orders of the defined types were placed in this encounter.  All questions were answered. The patient knows to call the clinic with any problems, questions or concerns.      Earna Coder, MD 03/09/2023 9:34 AM

## 2023-03-10 ENCOUNTER — Other Ambulatory Visit: Payer: Self-pay

## 2023-03-10 ENCOUNTER — Ambulatory Visit: Payer: 59

## 2023-03-10 ENCOUNTER — Inpatient Hospital Stay: Payer: 59

## 2023-03-10 VITALS — BP 163/90 | HR 62 | Temp 98.0°F

## 2023-03-10 DIAGNOSIS — C9000 Multiple myeloma not having achieved remission: Secondary | ICD-10-CM

## 2023-03-10 DIAGNOSIS — Z5112 Encounter for antineoplastic immunotherapy: Secondary | ICD-10-CM | POA: Diagnosis not present

## 2023-03-10 MED ORDER — SODIUM CHLORIDE 0.9 % IV SOLN
Freq: Once | INTRAVENOUS | Status: AC
  Filled 2023-03-10: qty 250

## 2023-03-10 MED ORDER — HEPARIN SOD (PORK) LOCK FLUSH 100 UNIT/ML IV SOLN
500.0000 [IU] | Freq: Once | INTRAVENOUS | Status: AC | PRN
  Administered 2023-03-10: 500 [IU]
  Filled 2023-03-10: qty 5

## 2023-03-10 MED ORDER — DEXTROSE 5 % IV SOLN
56.0000 mg/m2 | Freq: Once | INTRAVENOUS | Status: AC
  Administered 2023-03-10: 100 mg via INTRAVENOUS
  Filled 2023-03-10: qty 30

## 2023-03-10 MED ORDER — SODIUM CHLORIDE 0.9 % IV SOLN
20.0000 mg | Freq: Once | INTRAVENOUS | Status: AC
  Administered 2023-03-10: 20 mg via INTRAVENOUS
  Filled 2023-03-10: qty 20

## 2023-03-10 NOTE — Patient Instructions (Signed)
[facility]  Discharge Instructions: Thank you for choosing [facility] to provide your oncology and hematology care.  If you have a lab appointment with the Cancer Center, please go directly to the Cancer Center and check in at the registration area.  Wear comfortable clothing and clothing appropriate for easy access to any Portacath or PICC line.   We strive to give you quality time with your provider. You may need to reschedule your appointment if you arrive late (15 or more minutes).  Arriving late affects you and other patients whose appointments are after yours.  Also, if you miss three or more appointments without notifying the office, you may be dismissed from the clinic at the provider's discretion.      For prescription refill requests, have your pharmacy contact our office and allow 72 hours for refills to be completed.    Today you received the following chemotherapy and/or immunotherapy agents- kyprolis      To help prevent nausea and vomiting after your treatment, we encourage you to take your nausea medication as directed.  BELOW ARE SYMPTOMS THAT SHOULD BE REPORTED IMMEDIATELY: *FEVER GREATER THAN 100.4 F (38 C) OR HIGHER *CHILLS OR SWEATING *NAUSEA AND VOMITING THAT IS NOT CONTROLLED WITH YOUR NAUSEA MEDICATION *UNUSUAL SHORTNESS OF BREATH *UNUSUAL BRUISING OR BLEEDING *URINARY PROBLEMS (pain or burning when urinating, or frequent urination) *BOWEL PROBLEMS (unusual diarrhea, constipation, pain near the anus) TENDERNESS IN MOUTH AND THROAT WITH OR WITHOUT PRESENCE OF ULCERS (sore throat, sores in mouth, or a toothache) UNUSUAL RASH, SWELLING OR PAIN  UNUSUAL VAGINAL DISCHARGE OR ITCHING   Items with * indicate a potential emergency and should be followed up as soon as possible or go to the Emergency Department if any problems should occur.  Please show the CHEMOTHERAPY ALERT CARD or IMMUNOTHERAPY ALERT CARD at check-in to  the Emergency Department and triage nurse.  Should you have questions after your visit or need to cancel or reschedule your appointment, please contact [facility]  [phone] and follow the prompts.  Office hours are 8:00 a.m. to 4:30 p.m. Monday - Friday. Please note that voicemails left after 4:00 p.m. may not be returned until the following business day.  We are closed weekends and major holidays. You have access to a nurse at all times for urgent questions. Please call the main number to the clinic [phone] and follow the prompts.  For any non-urgent questions, you may also contact your provider using MyChart. We now offer e-Visits for anyone 18 and older to request care online for non-urgent symptoms. For details visit [email or web address].   Also download the MyChart app! Go to the app store, search "MyChart", open the app, select [facility], and log in with your MyChart username and password.    

## 2023-03-13 LAB — MULTIPLE MYELOMA PANEL, SERUM
Albumin SerPl Elph-Mcnc: 3.5 g/dL (ref 2.9–4.4)
Albumin/Glob SerPl: 2 — ABNORMAL HIGH (ref 0.7–1.7)
Alpha 1: 0.1 g/dL (ref 0.0–0.4)
Alpha2 Glob SerPl Elph-Mcnc: 0.4 g/dL (ref 0.4–1.0)
B-Globulin SerPl Elph-Mcnc: 1 g/dL (ref 0.7–1.3)
Gamma Glob SerPl Elph-Mcnc: 0.3 g/dL — ABNORMAL LOW (ref 0.4–1.8)
Globulin, Total: 1.8 g/dL — ABNORMAL LOW (ref 2.2–3.9)
IgA: 30 mg/dL — ABNORMAL LOW (ref 64–422)
IgG (Immunoglobin G), Serum: 373 mg/dL — ABNORMAL LOW (ref 586–1602)
IgM (Immunoglobulin M), Srm: <5 mg/dL — ABNORMAL LOW (ref 26–217)
M Protein SerPl Elph-Mcnc: 0.1 g/dL — ABNORMAL HIGH
Total Protein ELP: 5.3 g/dL — ABNORMAL LOW (ref 6.0–8.5)

## 2023-03-17 ENCOUNTER — Encounter: Payer: Self-pay | Admitting: Internal Medicine

## 2023-03-20 ENCOUNTER — Other Ambulatory Visit: Payer: Self-pay | Admitting: Internal Medicine

## 2023-03-22 MED FILL — Dexamethasone Sodium Phosphate Inj 100 MG/10ML: INTRAMUSCULAR | Qty: 2 | Status: AC

## 2023-03-23 ENCOUNTER — Inpatient Hospital Stay (HOSPITAL_BASED_OUTPATIENT_CLINIC_OR_DEPARTMENT_OTHER): Payer: 59 | Admitting: Internal Medicine

## 2023-03-23 ENCOUNTER — Inpatient Hospital Stay: Payer: 59

## 2023-03-23 ENCOUNTER — Encounter: Payer: Self-pay | Admitting: Internal Medicine

## 2023-03-23 VITALS — BP 134/84 | HR 80 | Temp 98.9°F | Ht 62.0 in | Wt 171.0 lb

## 2023-03-23 VITALS — BP 135/76 | HR 70 | Temp 98.6°F | Resp 18

## 2023-03-23 DIAGNOSIS — C9002 Multiple myeloma in relapse: Secondary | ICD-10-CM

## 2023-03-23 DIAGNOSIS — C9000 Multiple myeloma not having achieved remission: Secondary | ICD-10-CM

## 2023-03-23 DIAGNOSIS — Z5112 Encounter for antineoplastic immunotherapy: Secondary | ICD-10-CM | POA: Diagnosis not present

## 2023-03-23 LAB — CBC WITH DIFFERENTIAL (CANCER CENTER ONLY)
Abs Immature Granulocytes: 0.03 10*3/uL (ref 0.00–0.07)
Basophils Absolute: 0 10*3/uL (ref 0.0–0.1)
Basophils Relative: 0 %
Eosinophils Absolute: 0 10*3/uL (ref 0.0–0.5)
Eosinophils Relative: 1 %
HCT: 32.4 % — ABNORMAL LOW (ref 36.0–46.0)
Hemoglobin: 10.1 g/dL — ABNORMAL LOW (ref 12.0–15.0)
Immature Granulocytes: 1 %
Lymphocytes Relative: 29 %
Lymphs Abs: 1.4 10*3/uL (ref 0.7–4.0)
MCH: 30.4 pg (ref 26.0–34.0)
MCHC: 31.2 g/dL (ref 30.0–36.0)
MCV: 97.6 fL (ref 80.0–100.0)
Monocytes Absolute: 0.4 10*3/uL (ref 0.1–1.0)
Monocytes Relative: 8 %
Neutro Abs: 3 10*3/uL (ref 1.7–7.7)
Neutrophils Relative %: 61 %
Platelet Count: 259 10*3/uL (ref 150–400)
RBC: 3.32 MIL/uL — ABNORMAL LOW (ref 3.87–5.11)
RDW: 15 % (ref 11.5–15.5)
WBC Count: 4.8 10*3/uL (ref 4.0–10.5)
nRBC: 0 % (ref 0.0–0.2)

## 2023-03-23 LAB — CMP (CANCER CENTER ONLY)
ALT: 12 U/L (ref 0–44)
AST: 17 U/L (ref 15–41)
Albumin: 4.1 g/dL (ref 3.5–5.0)
Alkaline Phosphatase: 72 U/L (ref 38–126)
Anion gap: 7 (ref 5–15)
BUN: 20 mg/dL (ref 8–23)
CO2: 20 mmol/L — ABNORMAL LOW (ref 22–32)
Calcium: 8.4 mg/dL — ABNORMAL LOW (ref 8.9–10.3)
Chloride: 110 mmol/L (ref 98–111)
Creatinine: 0.97 mg/dL (ref 0.44–1.00)
GFR, Estimated: >60 mL/min (ref >60)
Glucose, Bld: 126 mg/dL — ABNORMAL HIGH (ref 70–99)
Potassium: 3.3 mmol/L — ABNORMAL LOW (ref 3.5–5.1)
Sodium: 137 mmol/L (ref 135–145)
Total Bilirubin: <0.1 mg/dL — ABNORMAL LOW (ref 0.3–1.2)
Total Protein: 6.8 g/dL (ref 6.5–8.1)

## 2023-03-23 MED ORDER — SODIUM CHLORIDE 0.9 % IV SOLN
10.0000 mg/kg | Freq: Once | INTRAVENOUS | Status: AC
  Administered 2023-03-23: 800 mg via INTRAVENOUS
  Filled 2023-03-23: qty 25

## 2023-03-23 MED ORDER — FAMOTIDINE IN NACL 20-0.9 MG/50ML-% IV SOLN
20.0000 mg | Freq: Once | INTRAVENOUS | Status: AC
  Administered 2023-03-23: 20 mg via INTRAVENOUS
  Filled 2023-03-23: qty 50

## 2023-03-23 MED ORDER — ACETAMINOPHEN 325 MG PO TABS
650.0000 mg | ORAL_TABLET | Freq: Once | ORAL | Status: AC
  Administered 2023-03-23: 650 mg via ORAL
  Filled 2023-03-23: qty 2

## 2023-03-23 MED ORDER — DIPHENHYDRAMINE HCL 50 MG/ML IJ SOLN
50.0000 mg | Freq: Once | INTRAMUSCULAR | Status: AC
  Administered 2023-03-23: 50 mg via INTRAVENOUS
  Filled 2023-03-23: qty 1

## 2023-03-23 MED ORDER — TRAMADOL HCL 50 MG PO TABS
50.0000 mg | ORAL_TABLET | Freq: Two times a day (BID) | ORAL | 0 refills | Status: DC | PRN
Start: 2023-03-23 — End: 2023-07-08

## 2023-03-23 MED ORDER — VITAMIN D (ERGOCALCIFEROL) 1.25 MG (50000 UNIT) PO CAPS: 50000.0000 [IU] | ORAL_CAPSULE | ORAL | 0 refills | Status: DC

## 2023-03-23 MED ORDER — PANTOPRAZOLE SODIUM 40 MG PO TBEC: 40.0000 mg | DELAYED_RELEASE_TABLET | Freq: Every day | ORAL | 0 refills | Status: DC

## 2023-03-23 MED ORDER — SODIUM CHLORIDE 0.9 % IV SOLN
Freq: Once | INTRAVENOUS | Status: AC
  Filled 2023-03-23: qty 250

## 2023-03-23 MED ORDER — DEXTROSE 5 % IV SOLN
56.0000 mg/m2 | Freq: Once | INTRAVENOUS | Status: AC
  Administered 2023-03-23: 100 mg via INTRAVENOUS
  Filled 2023-03-23: qty 30

## 2023-03-23 MED ORDER — SODIUM CHLORIDE 0.9 % IV SOLN
20.0000 mg | Freq: Once | INTRAVENOUS | Status: AC
  Administered 2023-03-23: 20 mg via INTRAVENOUS
  Filled 2023-03-23: qty 20

## 2023-03-23 MED ORDER — HEPARIN SOD (PORK) LOCK FLUSH 100 UNIT/ML IV SOLN
500.0000 [IU] | Freq: Once | INTRAVENOUS | Status: AC | PRN
  Administered 2023-03-23: 500 [IU]
  Filled 2023-03-23: qty 5

## 2023-03-23 MED FILL — Dexamethasone Sodium Phosphate Inj 100 MG/10ML: INTRAMUSCULAR | Qty: 2 | Status: AC

## 2023-03-23 NOTE — Patient Instructions (Signed)
[place] [facility]  Discharge Instructions: Thank you for choosing [facility] to provide your oncology and hematology care.  If you have a lab appointment with the Cancer Center, please go directly to the Cancer Center and check in at the registration area.  Wear comfortable clothing and clothing appropriate for easy access to any Portacath or PICC line.   We strive to give you quality time with your provider. You may need to reschedule your appointment if you arrive late (15 or more minutes).  Arriving late affects you and other patients whose appointments are after yours.  Also, if you miss three or more appointments without notifying the office, you may be dismissed from the clinic at the provider's discretion.      For prescription refill requests, have your pharmacy contact our office and allow 72 hours for refills to be completed.    Today you received the following chemotherapy and/or immunotherapy agents sarclisa and kyprolis      To help prevent nausea and vomiting after your treatment, we encourage you to take your nausea medication as directed.  BELOW ARE SYMPTOMS THAT SHOULD BE REPORTED IMMEDIATELY: *FEVER GREATER THAN 100.4 F (38 C) OR HIGHER *CHILLS OR SWEATING *NAUSEA AND VOMITING THAT IS NOT CONTROLLED WITH YOUR NAUSEA MEDICATION *UNUSUAL SHORTNESS OF BREATH *UNUSUAL BRUISING OR BLEEDING *URINARY PROBLEMS (pain or burning when urinating, or frequent urination) *BOWEL PROBLEMS (unusual diarrhea, constipation, pain near the anus) TENDERNESS IN MOUTH AND THROAT WITH OR WITHOUT PRESENCE OF ULCERS (sore throat, sores in mouth, or a toothache) UNUSUAL RASH, SWELLING OR PAIN  UNUSUAL VAGINAL DISCHARGE OR ITCHING   Items with * indicate a potential emergency and should be followed up as soon as possible or go to the Emergency Department if any problems should occur.  Please show the CHEMOTHERAPY ALERT CARD or IMMUNOTHERAPY ALERT CARD at  check-in to the Emergency Department and triage nurse.  Should you have questions after your visit or need to cancel or reschedule your appointment, please contact [place] [facility]  [phone] and follow the prompts.  Office hours are 8:00 a.m. to 4:30 p.m. Monday - Friday. Please note that voicemails left after 4:00 p.m. may not be returned until the following business day.  We are closed weekends and major holidays. You have access to a nurse at all times for urgent questions. Please call the main number to the clinic [phone] and follow the prompts.  For any non-urgent questions, you may also contact your provider using MyChart. We now offer e-Visits for anyone 86 and older to request care online for non-urgent symptoms. For details visit [email or web address].   Also download the MyChart app! Go to the app store, search "MyChart", open the app, select [place], and log in with your MyChart username and password.

## 2023-03-23 NOTE — Assessment & Plan Note (Addendum)
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  Most recently progressed on salvage therapy with daratumumab  lenalidomide [15 mg 3 w; 1-w] dexamethasone. plasma cell myeloma by approximately 70% of the overall cellular marrow;  FEB 2024-lambda light chain 284 Bone marrow-70% plasma cells. M Protein 0.9-rising lambda light chain ratio. Discontinue Revlimid; and dara-given the progressive disease.  MARCH 2024- PET scan: No skeletal lesions noted. APRIl 2024- MRI-cervical/thoracic/lumbar spine-negative for any lesions in the spinal cord. DISCONTINUEPomalyst;- carfilzomib-dexamethasone- [rising M-protein; MAY 2024- 1.3. ] start 12/27/2022-Isa-Carfil-Dex [[facility]]  #  12/27/2022-Isa-Dex; d-2carfil weekly q 28 days  [[facility]] MARCH 2024-Echo [[facility]] Left ventricular EF 50 to 55%; AUG 13th, 2024- EF-45-50%.  #  Partial response noted.  Proceed with cycle #4 day-1  today.   # Electrolyte: Hypokalemia: mild-  continue Kdur TID; hypocalcemia- 8.0- vit D march 2024- 50- continie Ergo 50k weekly. REcommend ca BID stable. Recheck Vit D levels  # Iatrogenic hypothyroidism [ Graves/goiter s/p total thyroidectomy;aug,2022 ]-currently on Synthroid 150 mcg/day-[increased in AUG ]2024-  again recommend  taking the pills fasting- stable. Will repeat labs in sep 2024.   # Lower back discomfort x 6 months-see above.  MRI lumbar spine as above.  Continue tramadol.  Stable.  # Bone lesions /last zometa on 11/27/2017.  S/p   Dental extraction PET scan evidence of any bone lesions.-Hold Zometa for now.  Stable.  # Infection prophylaxis: Acyclovir; asprin- add IVIG infusion [IgG < 400]  # IV access: mediport- functional  # Vaccination: Flu shot s/p;  s/p Pneumonia vaccination [march 2024]- stable.   6 w- given dental extrac- on 1/26. :MM panel; K/l light chain ratio q 4 W  D-isa-dex;car1&2-car; weekly x 3; week 4-OFF;  q 28 day cycle; NO  zometa- dental issues-ok; but hypocalemia  Vit D levels # DISPOSITION: # as per IS-  chemo today # as per IS-  as planned # 1 week- MD labs-cbc;bmp; Vit D levels; D-1 isa-dex-car D-2 car # 2 week- MD labs-:cbc;bmp;MM panel; K/l light chain ratio;D-1 isa-dex-car D-2 car # in 3 weeks- ONLY IVIG infusion-Dr.B

## 2023-03-23 NOTE — Progress Notes (Signed)
Needs refills on vit d, pantoprazole and tramadol, pending.  Pt has stuffy nose, no fever.

## 2023-03-23 NOTE — Progress Notes (Signed)
[facility] OFFICE PROGRESS NOTE  Patient Care Team: Leanna Sato, MD as PCP - General (Family Medicine) End, Cristal Deer, MD as PCP - Cardiology (Cardiology) Eddie Candle, [name] Earna Coder, MD as Consulting Physician (Hematology and Oncology) Vida Rigger, MD as Consulting Physician (Pulmonary Disease) Tedd Sias Marlana Salvage, MD as Consulting Physician (Endocrinology)   Cancer Staging  No matching staging information was found for the patient.   Oncology History Overview Note  # SEP 2018- MULTIPLE MYELOMA IgALamda [2.5 gm/dl; K/L= 16/1096]; STAGE III [beta 2 microglobulin=5.5] [presented with acute renal failure; anemia; NO hypercalcemia; Skeletal survey-Normal]; BMBx- 45% plasma cells; FISH-POSITIVE 11:14 translocation.[STANDARD-high RISK]/cyto-Normal; SEP 2018- PET- L3 posterior element lesion.   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 No morphologic support for residual myeloma disease. Negative for minimal residual disease by MM-MRD flow cytometry; HOLD REVLIMID [leg swelling]; AUG 2021-PET scan negative for myeloma; continue to hold Revlimid  # MARCH 2021-diastolic congestive heart failure [Dr.End]  # BMBx- OCT 2021- BMBx- 5% plasmacytosis-however this appears to be more polyclonal rather than monoclonal-not explain patient worsening anemia.   # OCT 2022- 18th- Dara- Rev-Dex; MARCH, 2024- PROGRESSION bone marrow biopsy  70% plasma cells; PET scan no bone lesions-however conus medullaris uptake-MRI lumbar spine-   # MAY 2024- DISCONTINUE carfilzomib-dexamethasone- Pomalyst; [rising M-protein; MAY 2024- 1.3. ]   # start 12/27/2022 -Isa-Carfil-Dex   #November 2021-hyperthyroidism/goiter- [D.Bennett/Dr.Solum]-methimazole. S/p Thyroidectomy [Dr.Kim; [facility]-AUG 2022]  --------------------------  # 12/12- RIGHT JUGULAR DVT-x 39m on xarelto; finished April 2020; September 2020-EGD/dysphagia; Dr. Tobi Bastos  # Acute renal failure [Dr.Singh; Proteinuria 1.5gm/day ]; acyclovir/Asprin ------------------------------------------------------------------------------------------------------------   DIAGNOSIS: [ ]  MULTIPLE MYELOMA  STAGE: III/HIGH RISK ;GOALS: CONTROL      Multiple myeloma not having achieved remission (HCC)  11/21/2017 - 04/28/2018 Chemotherapy   Patient is on Treatment Plan : MYELOMA SALVAGE Cyclophosphamide / Carfilzomib / Dexamethasone (CCd) q28d     05/12/2021 - 02/25/2022 Chemotherapy   Patient is on Treatment Plan : MYELOMA RELAPSED REFRACTORY Daratumumab SQ + Lenalidomide + Dexamethasone (DaraRd) q28d     05/12/2021 - 07/23/2022 Chemotherapy   Patient is on Treatment Plan : MYELOMA RELAPSED REFRACTORY Daratumumab SQ + Lenalidomide + Dexamethasone (DaraRd) q28d     11/02/2022 - 11/16/2022 Chemotherapy   Patient is on Treatment Plan : MYELOMA RELAPSED/ REFRACTORY Carfilzomib D1,8,15 (20/27) + Pomalidomide + Dexamethasone (KPd) q28d     12/27/2022 -  Chemotherapy   Patient is on Treatment Plan : MYELOMA Carfilzomib + Dexamethasone + Isatuximab q28d      INTERVAL HISTORY: Ambulating  independently.   Alone.  Brandi [name] 72 y.o.  female pleasant patient above history of relapsed multiple myeloma--currently  [name]- Carfil-Dex is here for a follow up.  Mild swelling in legs. Complains of sinus stuffiness. No fevers or chills. No infections.   Pt states sometimes it feels like something is stuck on the  bottom of her rt foot.   Denies any worsening pain.  Patient taking tramadol as needed for upper and leg cramps.  Review of Systems  Constitutional:  Positive for malaise/fatigue. Negative for chills, diaphoresis and fever.  HENT:  Negative for nosebleeds and sore throat.   Eyes:  Negative for double vision.  Respiratory:  Negative for hemoptysis.   Cardiovascular:  Negative for chest pain, palpitations and orthopnea.  Gastrointestinal:  Negative for abdominal pain, blood in stool, constipation, heartburn and melena.  Genitourinary:  Negative for dysuria, frequency and urgency.  Musculoskeletal:  Positive for back pain.  Skin: Negative.  Negative for itching and rash.  Neurological:  Negative for dizziness, tingling, focal weakness, weakness and headaches.  Endo/Heme/Allergies:  Does not bruise/bleed easily.  Psychiatric/Behavioral:  Negative for depression. The patient is not nervous/anxious.     PAST MEDICAL HISTORY :  Past Medical History:  Diagnosis Date   (HFpEF) heart failure with preserved ejection fraction (HCC)    a. 06/2020 Echo: EF 60-65%, no rwma, nl RV size/fxn. Triv MR. Triv TR/AI. Mod elev PASP.   Anxiety    COPD (chronic obstructive pulmonary disease) (HCC)    GERD (gastroesophageal reflux disease)    Hypertension    Hyperthyroidism    Hypokalemia    IgA myeloma (HCC)    Multiple myeloma (HCC)    Pneumonia    Red blood cell antibody positive, compatible PRBC difficult to obtain    S/P autologous bone marrow transplantation (HCC)     PAST SURGICAL HISTORY :   Past Surgical History:  Procedure Laterality Date   ANTERIOR VITRECTOMY Left 10/07/2021   Procedure: ANTERIOR VITRECTOMY;  Surgeon: Lockie Mola, MD;  Location: [facility];  Service: Ophthalmology;  Laterality: Left;   CATARACT EXTRACTION W/PHACO Left 10/07/2021   Procedure: CATARACT EXTRACTION PHACO AND INTRAOCULAR LENS PLACEMENT (IOC) LEFT VISION BLUE;  Surgeon: Lockie Mola,  MD;  Location: [facility];  Service: Ophthalmology;  Laterality: Left;  kit for manual incision 14.56 01:21.4   CATARACT EXTRACTION W/PHACO Right 10/21/2021   Procedure: CATARACT EXTRACTION PHACO AND INTRAOCULAR LENS PLACEMENT (IOC) RIGHT 11.12 01:48.1;  Surgeon: Lockie Mola, MD;  Location: [facility];  Service: Ophthalmology;  Laterality: Right;   ESOPHAGOGASTRODUODENOSCOPY (EGD) WITH PROPOFOL N/A 03/30/2019   Procedure: ESOPHAGOGASTRODUODENOSCOPY (EGD) WITH PROPOFOL;  Surgeon: Wyline Mood, MD;  Location: [facility];  Service: Gastroenterology;  Laterality: N/A;   IR FLUORO GUIDE PORT INSERTION RIGHT  12/16/2017   IR IMAGING GUIDED PORT INSERTION  06/15/2021   KNEE ARTHROSCOPY Left 09/03/2022   Procedure: ARTHROSCOPY KNEE DEBRIDEMENT;  Surgeon: Lyndle Herrlich, MD;  Location: [facility] OR[facility];  Service: Orthopedics;  Laterality: Left;   THYROIDECTOMY N/A     FAMILY HISTORY :   Family History  Problem Relation Age of Onset   Pneumonia Mother    Seizures Father     SOCIAL HISTORY:   Social History   Tobacco Use   Smoking status: Former    Current packs/day: 0.00    Types: Cigarettes    Quit date: 03/18/2021    Years since quitting: 2.0   Smokeless tobacco: Never   Tobacco comments:    2 cigarettes QOD  Vaping Use   Vaping status: Never Used  Substance Use Topics   Alcohol use: No   Drug use: No    ALLERGIES:  has No Known Allergies.  MEDICATIONS:  Current Outpatient Medications  Medication Sig Dispense Refill   acyclovir (ZOVIRAX) 400 MG tablet Take 1 tablet by mouth twice daily 60 tablet 0   albuterol (VENTOLIN HFA) 108 (90 Base) MCG/ACT inhaler Inhale 2 puffs into the lungs every  6 (six) hours as needed for wheezing or shortness of breath. 8 g 3   aspirin EC 81 MG EC tablet Take 1 tablet (81 mg total) by mouth daily. 90 tablet 3   carvedilol (COREG) 3.125 MG tablet Take 1 tablet (3.125 mg total) by mouth 2 (two) times daily with a meal. 60 tablet 5    furosemide (LASIX) 40 MG tablet Take 1 tablet (40 mg total) by mouth daily. 90 tablet 3   levothyroxine (SYNTHROID) 150 MCG tablet Take 1 tablet (150 mcg total) by mouth daily before breakfast. Take before eating or drinking anything. DO Not  eat or drink until after an hour later after taking medication. 30 tablet 3   lidocaine-prilocaine (EMLA) cream Apply 1 application topically as needed. Apply to port and cover with saran wrap 1-2 hours prior to port access 30 g 1   magnesium oxide (MAG-OX) 400 (240 Mg) MG tablet Take 1 tablet (400 mg total) by mouth 2 (two) times daily. 60 tablet 3   methylPREDNISolone (MEDROL DOSEPAK) 4 MG TBPK tablet Use as directed. 21 tablet 1   montelukast (SINGULAIR) 10 MG tablet Take 1 tablet (10 mg total) by mouth at bedtime. 30 tablet 3   ondansetron (ZOFRAN) 8 MG tablet One pill every 8 hours as needed for nausea/vomitting. 40 tablet 1   polyethylene glycol (MIRALAX / GLYCOLAX) packet Take 17 g by mouth daily as needed for mild constipation. 14 each 0   pomalidomide (POMALYST) 4 MG capsule Take 1 capsule (4 mg total) by mouth daily. Take for 21 days, then old for 7 days. Repeat every 28 days. 21 capsule 0   potassium chloride SA (KLOR-CON M) 20 MEQ tablet Take 1 tablet (20 mEq total) by mouth 2 (two) times daily. 60 tablet 6   spironolactone (ALDACTONE) 25 MG tablet Take 1 tablet by mouth once daily 90 tablet 3   calcium-vitamin D (OSCAL WITH D) 500MG -200UNIT ( ) tablet Take 1 tablet by mouth 2 (two) times daily. (Patient not taking: Reported on 03/23/2023) 60 tablet 0   pantoprazole (PROTONIX) 40 MG tablet Take 1 tablet (40 mg total) by mouth daily. 90 tablet 0   traMADol (ULTRAM) 50 MG tablet Take 1 tablet (50 mg total) by mouth every 12 (twelve) hours as needed. 60 tablet 0   Vitamin D, Ergocalciferol, (DRISDOL) 1.25 MG (50000 UNIT) CAPS capsule Take 1 capsule (50,000 Units total) by mouth once a week. 12 capsule 0   No current facility-administered  medications for this visit.   Facility-Administered Medications Ordered in Other Visits  Medication Dose Route Frequency Provider Last Rate Last Admin   0.9 %  sodium chloride infusion   Intravenous Continuous Ralene Muskrat, PA-C       0.9 %  sodium chloride infusion   Intravenous Once Earna Coder, MD       carfilzomib (KYPROLIS) 100 mg in dextrose 5 % 100 mL chemo infusion  56 mg/m2 (Treatment Plan Recorded) Intravenous Once Earna Coder, MD       dexamethasone (DECADRON) 20 mg in sodium chloride 0.9 % 50 mL IVPB  20 mg Intravenous Once Earna Coder, MD 208 mL/hr at 03/23/23 0921 20 mg at 03/23/23 1610   diphenhydrAMINE (BENADRYL) injection 50 mg  50 mg Intravenous Once Earna Coder, MD       famotidine (PEPCID) IVPB 20 mg premix  20 mg Intravenous Once Earna Coder, MD       isatuximab-irfc (SARCLISA) 800 mg in sodium chloride  0.9 % 210 mL (3.2 mg/mL) chemo infusion  10 mg/kg (Treatment Plan Recorded) Intravenous Once Earna Coder, MD        PHYSICAL EXAMINATION:   BP 134/84 (BP Location: Left Arm, Patient Position: Sitting, Cuff Size: Normal)   Pulse 80   Temp 98.9 F (37.2 C) (Tympanic)   Ht 5\' 2"  (1.575 m)   Wt 171 lb (77.6 kg)   SpO2 100%   BMI 31.28 kg/m   Filed Weights   03/23/23 0807  Weight: 171 lb (77.6 kg)   Mild leg swelling.   Physical Exam HENT:     Head: Normocephalic and atraumatic.  Eyes:     Pupils: Pupils are equal, round, and reactive to light.  Cardiovascular:     Rate and Rhythm: Normal rate and regular rhythm.  Pulmonary:     Effort: Pulmonary effort is normal. No respiratory distress.     Breath sounds: Normal breath sounds. No wheezing.  Abdominal:     General: Bowel sounds are normal. There is no distension.     Palpations: Abdomen is soft. There is no mass.     Tenderness: There is no abdominal tenderness. There is no guarding or rebound.  Musculoskeletal:        General: No tenderness.  Normal range of motion.     Cervical back: Normal range of motion and neck supple.  Skin:    General: Skin is warm.  Neurological:     Mental Status: She is alert and oriented to person, place, and time.  Psychiatric:        Mood and Affect: Affect normal.    LABORATORY DATA:  I have reviewed the data as listed    Component Value Date/Time   NA 137 03/23/2023 0803   NA 141 01/25/2020 1530   K 3.3 (L) 03/23/2023 0803   CL 110 03/23/2023 0803   CO2 20 (L) 03/23/2023 0803   GLUCOSE 126 (H) 03/23/2023 0803   BUN 20 03/23/2023 0803   BUN 19 01/25/2020 1530   CREATININE 0.97 03/23/2023 0803   CALCIUM 8.4 (L) 03/23/2023 0803   PROT 6.8 03/23/2023 0803   ALBUMIN 4.1 03/23/2023 0803   AST 17 03/23/2023 0803   ALT 12 03/23/2023 0803   ALKPHOS 72 03/23/2023 0803   BILITOT <0.1 (L) 03/23/2023 0803   GFRNONAA >60 03/23/2023 0803   GFRAA NOT CALCULATED 04/17/2020 1352    No results found for: "SPEP", "UPEP"  Lab Results  Component Value Date   WBC 4.8 03/23/2023   NEUTROABS 3.0 03/23/2023   HGB 10.1 (L) 03/23/2023   HCT 32.4 (L) 03/23/2023   MCV 97.6 03/23/2023   PLT 259 03/23/2023      Chemistry      Component Value Date/Time   NA 137 03/23/2023 0803   NA 141 01/25/2020 1530   K 3.3 (L) 03/23/2023 0803   CL 110 03/23/2023 0803   CO2 20 (L) 03/23/2023 0803   BUN 20 03/23/2023 0803   BUN 19 01/25/2020 1530   CREATININE 0.97 03/23/2023 0803      Component Value Date/Time   CALCIUM 8.4 (L) 03/23/2023 0803   ALKPHOS 72 03/23/2023 0803   AST 17 03/23/2023 0803   ALT 12 03/23/2023 0803   BILITOT <0.1 (L) 03/23/2023 0803      (H): Data is abnormally high   Latest Reference Range & Units Most Recent 12/24/20 14:01 04/01/21 14:34 04/28/21 09:17 06/11/21 09:42 07/03/21 10:32 08/20/21 10:06  M Protein SerPl Elph-Mcnc Not  Observed g/dL 1.1 (H) (C) 11/02/79 19:14 Not Observed (C) 0.6 (H) (C) 0.7 (H) (C) 0.5 (H) (C) 0.7 (H) (C) 1.1 (H) (C)    Latest Reference Range & Units  Most Recent 09/17/21 08:47 10/15/21 08:25 11/12/21 08:55  Kappa free light chain 3.3 - 19.4 mg/L 5.0 11/12/21 08:55 7.2 7.2 5.0  Lambda free light chains 5.7 - 26.3 mg/L 223.6 (H) 11/12/21 08:55 215.3 (H) 340.2 (H) 223.6 (H)  Kappa, lambda light chain ratio 0.26 - 1.65  0.02 (L) 11/12/21 08:55 0.03 (L) 0.02 (L) 0.02 (L)  (H): Data is abnormally high (L): Data is abnormally low  RADIOGRAPHIC STUDIES: I have personally reviewed the radiological images as listed and agreed with the findings in the report. No results found.   ASSESSMENT & PLAN:  Multiple myeloma not having achieved remission (HCC) # RECURRENT Multiple myeloma stage III [high-risk cytogenetics-status post KRD-VGPR ; status post autologous stem cell transplant on 06/15/18.  Most recently progressed on salvage therapy with daratumumab  lenalidomide [15 mg 3 w; 1-w] dexamethasone. plasma cell myeloma by approximately 70% of the overall cellular marrow;  FEB 2024-lambda light chain 284 Bone marrow-70% plasma cells. M Protein 0.9-rising lambda light chain ratio. Discontinue Revlimid; and dara-given the progressive disease.  MARCH 2024- PET scan: No skeletal lesions noted. APRIl 2024- MRI-cervical/thoracic/lumbar spine-negative for any lesions in the spinal cord. DISCONTINUEPomalyst;- carfilzomib-dexamethasone- [rising M-protein; MAY 2024- 1.3. ] start 12/27/2022-Isa-Carfil-Dex [[facility]]  #  12/27/2022-Isa-Dex; d-2carfil weekly q 28 days  [[facility]] MARCH 2024-Echo [[facility]] Left ventricular EF 50 to 55%; AUG 13th, 2024- EF-45-50%.  #  Partial response noted.  Proceed with cycle #4 day-1  today.   # Electrolyte: Hypokalemia: mild-  continue Kdur TID; hypocalcemia- 8.0- vit D march 2024- 50- continie Ergo 50k weekly. REcommend ca BID stable. Recheck Vit D levels  # Iatrogenic hypothyroidism [ Graves/goiter s/p total thyroidectomy;aug,2022 ]-currently on Synthroid 150 mcg/day-[increased in AUG ]2024-  again recommend  taking the pills fasting- stable.  Will repeat labs in sep 2024.   # Lower back discomfort x 6 months-see above.  MRI lumbar spine as above.  Continue tramadol.  Stable.  # Bone lesions /last zometa on 11/27/2017.  S/p   Dental extraction PET scan evidence of any bone lesions.-Hold Zometa for now.  Stable.  # Infection prophylaxis: Acyclovir; asprin- add IVIG infusion [IgG < 400]  # IV access: mediport- functional  # Vaccination: Flu shot s/p;  s/p Pneumonia vaccination [march 2024]- stable.   6 w- given dental extrac- on 1/26. :MM panel; K/l light chain ratio q 4 W  D-isa-dex;car1&2-car; weekly x 3; week 4-OFF;  q 28 day cycle; NO zometa- dental issues-ok; but hypocalemia  Vit D levels # DISPOSITION: # as per IS-  chemo today # as per IS-  as planned # 1 week- MD labs-cbc;bmp; Vit D levels; D-1 isa-dex-car D-2 car # 2 week- MD labs-:cbc;bmp;MM panel; K/l light chain ratio;D-1 isa-dex-car D-2 car # in 3 weeks- ONLY IVIG infusion-Dr.B   Orders Placed This Encounter  Procedures   CBC with Differential (Cancer Center Only)    Standing Status:   Future    Standing Expiration Date:   04/05/2024   CMP (Cancer Center only)    Standing Status:   Future    Standing Expiration Date:   04/05/2024   VITAMIN D 25 Hydroxy (Vit-D Deficiency, Fractures)    Standing Status:   Future    Standing Expiration Date:   03/22/2024   Multiple Myeloma Panel (SPEP&IFE w/QIG)  Standing Status:   Future    Standing Expiration Date:   03/22/2024   Kappa/lambda light chains    Standing Status:   Future    Standing Expiration Date:   03/22/2024   All questions were answered. The patient knows to call the clinic with any problems, questions or concerns.      Earna Coder, MD 03/23/2023 9:27 AM

## 2023-03-24 ENCOUNTER — Encounter: Payer: Self-pay | Admitting: Internal Medicine

## 2023-03-24 ENCOUNTER — Inpatient Hospital Stay: Payer: 59

## 2023-03-24 VITALS — BP 135/73 | HR 68 | Temp 99.1°F | Resp 17

## 2023-03-24 DIAGNOSIS — Z5112 Encounter for antineoplastic immunotherapy: Secondary | ICD-10-CM | POA: Diagnosis not present

## 2023-03-24 DIAGNOSIS — C9000 Multiple myeloma not having achieved remission: Secondary | ICD-10-CM

## 2023-03-24 LAB — KAPPA/LAMBDA LIGHT CHAINS
Kappa free light chain: 1.6 mg/L — ABNORMAL LOW (ref 3.3–19.4)
Kappa, lambda light chain ratio: 0.22 — ABNORMAL LOW (ref 0.26–1.65)
Lambda free light chains: 7.4 mg/L (ref 5.7–26.3)

## 2023-03-24 MED ORDER — DEXTROSE 5 % IV SOLN
56.0000 mg/m2 | Freq: Once | INTRAVENOUS | Status: AC
  Administered 2023-03-24: 100 mg via INTRAVENOUS
  Filled 2023-03-24: qty 30

## 2023-03-24 MED ORDER — SODIUM CHLORIDE 0.9 % IV SOLN
Freq: Once | INTRAVENOUS | Status: AC
  Filled 2023-03-24: qty 250

## 2023-03-24 MED ORDER — HEPARIN SOD (PORK) LOCK FLUSH 100 UNIT/ML IV SOLN
500.0000 [IU] | Freq: Once | INTRAVENOUS | Status: AC | PRN
  Administered 2023-03-24: 500 [IU]
  Filled 2023-03-24: qty 5

## 2023-03-24 MED ORDER — SODIUM CHLORIDE 0.9 % IV SOLN
20.0000 mg | Freq: Once | INTRAVENOUS | Status: AC
  Administered 2023-03-24: 20 mg via INTRAVENOUS
  Filled 2023-03-24: qty 20

## 2023-03-24 NOTE — Patient Instructions (Signed)
[facility]  Discharge Instructions: Thank you for choosing [facility] to provide your oncology and hematology care.  If you have a lab appointment with the Cancer Center, please go directly to the Cancer Center and check in at the registration area.  Wear comfortable clothing and clothing appropriate for easy access to any Portacath or PICC line.   We strive to give you quality time with your provider. You may need to reschedule your appointment if you arrive late (15 or more minutes).  Arriving late affects you and other patients whose appointments are after yours.  Also, if you miss three or more appointments without notifying the office, you may be dismissed from the clinic at the provider's discretion.      For prescription refill requests, have your pharmacy contact our office and allow 72 hours for refills to be completed.    Today you received the following chemotherapy and/or immunotherapy agents- kyprolis      To help prevent nausea and vomiting after your treatment, we encourage you to take your nausea medication as directed.  BELOW ARE SYMPTOMS THAT SHOULD BE REPORTED IMMEDIATELY: *FEVER GREATER THAN 100.4 F (38 C) OR HIGHER *CHILLS OR SWEATING *NAUSEA AND VOMITING THAT IS NOT CONTROLLED WITH YOUR NAUSEA MEDICATION *UNUSUAL SHORTNESS OF BREATH *UNUSUAL BRUISING OR BLEEDING *URINARY PROBLEMS (pain or burning when urinating, or frequent urination) *BOWEL PROBLEMS (unusual diarrhea, constipation, pain near the anus) TENDERNESS IN MOUTH AND THROAT WITH OR WITHOUT PRESENCE OF ULCERS (sore throat, sores in mouth, or a toothache) UNUSUAL RASH, SWELLING OR PAIN  UNUSUAL VAGINAL DISCHARGE OR ITCHING   Items with * indicate a potential emergency and should be followed up as soon as possible or go to the Emergency Department if any problems should occur.  Please show the CHEMOTHERAPY ALERT CARD or IMMUNOTHERAPY ALERT CARD at check-in to  the Emergency Department and triage nurse.  Should you have questions after your visit or need to cancel or reschedule your appointment, please contact [facility]  [phone] and follow the prompts.  Office hours are 8:00 a.m. to 4:30 p.m. Monday - Friday. Please note that voicemails left after 4:00 p.m. may not be returned until the following business day.  We are closed weekends and major holidays. You have access to a nurse at all times for urgent questions. Please call the main number to the clinic [phone] and follow the prompts.  For any non-urgent questions, you may also contact your provider using MyChart. We now offer e-Visits for anyone 18 and older to request care online for non-urgent symptoms. For details visit [email or web address].   Also download the MyChart app! Go to the app store, search "MyChart", open the app, select [facility], and log in with your MyChart username and password.    

## 2023-03-25 ENCOUNTER — Other Ambulatory Visit: Payer: Self-pay

## 2023-03-29 NOTE — Assessment & Plan Note (Addendum)
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  Most recently progressed on salvage therapy with daratumumab  lenalidomide [15 mg 3 w; 1-w] dexamethasone. plasma cell myeloma by approximately 70% of the overall cellular marrow;  FEB 2024-lambda light chain 284 Bone marrow-70% plasma cells. M Protein 0.9-rising lambda light chain ratio. Discontinue Revlimid; and dara-given the progressive disease.  MARCH 2024- PET scan: No skeletal lesions noted. APRIl 2024- MRI-cervical/thoracic/lumbar spine-negative for any lesions in the spinal cord. DISCONTINUEPomalyst;- carfilzomib-dexamethasone- [rising M-protein; MAY 2024- 1.3. ] start 12/27/2022-Isa-Carfil-Dex [[facility]]  #  12/27/2022-Isa-Dex; d-2carfil weekly q 28 days  [[facility]] MARCH 2024-Echo [[facility]] Left ventricular EF 50 to 55%; AUG 13th, 2024- EF-45-50%.  #  Partial response noted.  Proceed with cycle #4 day-8  today.   # Cough/ intermittent dyspnea- no fevers or chills-recommend robitussin; add BNP to labs today; and HOLD off any Lasix ; continue nebs/inhaler.   # Electrolyte: Hypokalemia: mild-  continue Kdur TID; hypocalcemia- 8.0- vit D march 2024- 50- continie Ergo 50k weekly. REcommend ca BID stable. Recheck Vit D levels  # Iatrogenic hypothyroidism [ Graves/goiter s/p total thyroidectomy;aug,2022 ]-currently on Synthroid 150 mcg/day-[increased in AUG ]2024-  again recommend  taking the pills fasting- stable. Will repeat labs in sep 2024.   # Lower back discomfort x 6 months-see above.  MRI lumbar spine as above.  Continue tramadol.  Stable.  # Bone lesions /last zometa on 11/27/2017.  S/p   Dental extraction PET scan evidence of any bone lesions.-Hold Zometa for now.  Stable.  # Infection prophylaxis: Acyclovir; asprin- add IVIG infusion [IgG < 400]  # IV access: mediport- functional  # Vaccination: Flu shot s/p;  s/p Pneumonia vaccination [march 2024]-  stable.   6 w- given dental extrac- on 1/26. :MM panel; K/l light chain ratio q 4 W  D-isa-dex;car1&2-car; weekly x 3; week 4-OFF;  q 28 day cycle; NO zometa- dental issues-ok; but hypocalemia  Vit D levels # DISPOSITION: # ADD BNP today- # as per IS-  chemo today; and tomorrow  # as per IS-  as planned # 1 week- MD labs-cbc;bmp;  D-1 isa-dex-car D-2 car # in 2  weeks ONLY IVIG infusion- # 3  week- MD labs-:cbc;bmp;;D-1 isa-dex-car D-2 car-  Dr.B

## 2023-03-29 NOTE — Progress Notes (Signed)
[facility] OFFICE PROGRESS NOTE  Patient Care Team: Leanna Sato, MD as PCP - General (Family Medicine) End, Cristal Deer, MD as PCP - Cardiology (Cardiology) Eddie Candle, [name] Earna Coder, MD as Consulting Physician (Hematology and Oncology) Vida Rigger, MD as Consulting Physician (Pulmonary Disease) Tedd Sias Marlana Salvage, MD as Consulting Physician (Endocrinology)   Cancer Staging  No matching staging information was found for the patient.   Oncology History Overview Note  # SEP 2018- MULTIPLE MYELOMA IgALamda [2.5 gm/dl; K/L= 16/1096]; STAGE III [beta 2 microglobulin=5.5] [presented with acute renal failure; anemia; NO hypercalcemia; Skeletal survey-Normal]; BMBx- 45% plasma cells; FISH-POSITIVE 11:14 translocation.[STANDARD-high RISK]/cyto-Normal; SEP 2018- PET- L3 posterior element lesion.   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 No morphologic support for residual myeloma disease. Negative for minimal residual disease by MM-MRD flow cytometry; HOLD REVLIMID [leg swelling]; AUG 2021-PET scan negative for myeloma; continue to hold Revlimid  # MARCH 2021-diastolic congestive heart failure [Dr.End]  # BMBx- OCT 2021- BMBx- 5% plasmacytosis-however this appears to be more polyclonal rather than monoclonal-not explain patient worsening anemia.   # OCT 2022- 18th- Dara- Rev-Dex; MARCH, 2024- PROGRESSION bone marrow biopsy  70% plasma cells; PET scan no bone lesions-however conus medullaris uptake-MRI lumbar spine-   # MAY 2024- DISCONTINUE carfilzomib-dexamethasone- Pomalyst; [rising M-protein; MAY 2024- 1.3. ]   # start 12/27/2022 -Isa-Carfil-Dex   #November 2021-hyperthyroidism/goiter- [D.Bennett/Dr.Solum]-methimazole. S/p Thyroidectomy [Dr.Kim; [facility]-AUG 2022]  --------------------------  # 12/12- RIGHT JUGULAR DVT-x 6m on xarelto; finished April 2020; September 2020-EGD/dysphagia; Dr. Tobi Bastos  # Acute renal failure [Dr.Singh; Proteinuria 1.5gm/day ]; acyclovir/Asprin ------------------------------------------------------------------------------------------------------------   DIAGNOSIS: [ ]  MULTIPLE MYELOMA  STAGE: III/HIGH RISK ;GOALS: CONTROL      Multiple myeloma not having achieved remission (HCC)  11/21/2017 - 04/28/2018 Chemotherapy   Patient is on Treatment Plan : MYELOMA SALVAGE Cyclophosphamide / Carfilzomib / Dexamethasone (CCd) q28d     05/12/2021 - 02/25/2022 Chemotherapy   Patient is on Treatment Plan : MYELOMA RELAPSED REFRACTORY Daratumumab SQ + Lenalidomide + Dexamethasone (DaraRd) q28d     05/12/2021 - 07/23/2022 Chemotherapy   Patient is on Treatment Plan : MYELOMA RELAPSED REFRACTORY Daratumumab SQ + Lenalidomide + Dexamethasone (DaraRd) q28d     11/02/2022 - 11/16/2022 Chemotherapy   Patient is on Treatment Plan : MYELOMA RELAPSED/ REFRACTORY Carfilzomib D1,8,15 (20/27) + Pomalidomide + Dexamethasone (KPd) q28d     12/27/2022 -  Chemotherapy   Patient is on Treatment Plan : MYELOMA Carfilzomib + Dexamethasone + Isatuximab q28d      INTERVAL HISTORY: Ambulating  independently.   Alone.  Brandi [name] 72 y.o.  female pleasant patient above history of relapsed multiple myeloma--currently  [name]- Carfil-Dex is here for a follow up.  Patient complains of coughing x1 week, feeling more SOB. Feeling like a pressure in center of chest. Mild swelling in legs. Complains of sinus stuffiness.    No fevers or chills. No infections. Denies any worsening pain.  Patient taking tramadol as needed for upper and leg cramps. Mild tingling in  feet.    Review of Systems  Constitutional:  Positive for malaise/fatigue. Negative for chills, diaphoresis and fever.  HENT:  Negative for nosebleeds and sore throat.   Eyes:  Negative for double vision.  Respiratory:  Negative for hemoptysis.   Cardiovascular:  Negative for chest pain, palpitations and orthopnea.  Gastrointestinal:  Negative for abdominal pain, blood in stool, constipation, heartburn and melena.  Genitourinary:  Negative for dysuria, frequency and urgency.  Musculoskeletal:  Positive for back pain.  Skin: Negative.  Negative for itching and rash.  Neurological:  Negative for dizziness, tingling, focal weakness, weakness and headaches.  Endo/Heme/Allergies:  Does not bruise/bleed easily.  Psychiatric/Behavioral:  Negative for depression. The patient is not nervous/anxious.     PAST MEDICAL HISTORY :  Past Medical History:  Diagnosis Date   (HFpEF) heart failure with preserved ejection fraction (HCC)    a. 06/2020 Echo: EF 60-65%, no rwma, nl RV size/fxn. Triv MR. Triv TR/AI. Mod elev PASP.   Anxiety    COPD (chronic obstructive pulmonary disease) (HCC)    GERD (gastroesophageal reflux disease)    Hypertension    Hyperthyroidism    Hypokalemia    IgA myeloma (HCC)    Multiple myeloma (HCC)    Pneumonia    Red blood cell antibody positive, compatible PRBC difficult to obtain    S/P autologous bone marrow transplantation (HCC)     PAST SURGICAL HISTORY :   Past Surgical History:  Procedure Laterality Date   ANTERIOR VITRECTOMY Left 10/07/2021   Procedure: ANTERIOR VITRECTOMY;  Surgeon: Lockie Mola, MD;  Location: [facility];  Service: Ophthalmology;  Laterality: Left;   CATARACT EXTRACTION W/PHACO Left 10/07/2021   Procedure: CATARACT EXTRACTION PHACO AND INTRAOCULAR LENS PLACEMENT (IOC) LEFT VISION  BLUE;  Surgeon: Lockie Mola, MD;  Location: [facility];  Service: Ophthalmology;  Laterality: Left;  kit for manual incision 14.56 01:21.4   CATARACT EXTRACTION W/PHACO Right 10/21/2021   Procedure: CATARACT EXTRACTION PHACO AND INTRAOCULAR LENS PLACEMENT (IOC) RIGHT 11.12 01:48.1;  Surgeon: Lockie Mola, MD;  Location: [facility];  Service: Ophthalmology;  Laterality: Right;   ESOPHAGOGASTRODUODENOSCOPY (EGD) WITH PROPOFOL N/A 03/30/2019   Procedure: ESOPHAGOGASTRODUODENOSCOPY (EGD) WITH PROPOFOL;  Surgeon: Wyline Mood, MD;  Location: [facility];  Service: Gastroenterology;  Laterality: N/A;   IR FLUORO GUIDE PORT INSERTION RIGHT  12/16/2017   IR IMAGING GUIDED PORT INSERTION  06/15/2021   KNEE ARTHROSCOPY Left 09/03/2022   Procedure: ARTHROSCOPY KNEE DEBRIDEMENT;  Surgeon: Lyndle Herrlich, MD;  Location: [facility];  Service: Orthopedics;  Laterality: Left;   THYROIDECTOMY N/A     FAMILY HISTORY :   Family History  Problem Relation Age of Onset   Pneumonia Mother    Seizures Father     SOCIAL HISTORY:   Social History   Tobacco Use   Smoking status: Former    Current packs/day: 0.00    Types: Cigarettes    Quit date: 03/18/2021    Years since quitting: 2.0   Smokeless tobacco: Never   Tobacco comments:    2 cigarettes QOD  Vaping Use   Vaping status: Never Used  Substance Use Topics   Alcohol use: No   Drug use: No    ALLERGIES:  has No Known Allergies.  MEDICATIONS:  Current Outpatient Medications  Medication Sig Dispense Refill   acyclovir (ZOVIRAX) 400 MG tablet Take 1 tablet by mouth twice daily 60 tablet 0   albuterol (VENTOLIN HFA) 108 (90 Base) MCG/ACT inhaler Inhale 2 puffs  into the lungs every 6 (six) hours as needed for wheezing or shortness of breath. 8 g 3   aspirin EC 81 MG EC tablet Take 1 tablet (81 mg total) by mouth daily. 90 tablet 3   carvedilol (COREG) 3.125 MG tablet Take 1 tablet (3.125 mg total) by mouth 2 (two)  times daily with a meal. 60 tablet 5   furosemide (LASIX) 40 MG tablet Take 1 tablet (40 mg total) by mouth daily. 90 tablet 3   levothyroxine (SYNTHROID) 150 MCG tablet Take 1 tablet (150 mcg total) by mouth daily before breakfast. Take before eating or drinking anything. DO Not  eat or drink until after an hour later after taking medication. 30 tablet 3   lidocaine-prilocaine (EMLA) cream Apply 1 application topically as needed. Apply to port and cover with saran wrap 1-2 hours prior to port access 30 g 1   magnesium oxide (MAG-OX) 400 (240 Mg) MG tablet Take 1 tablet (400 mg total) by mouth 2 (two) times daily. 60 tablet 3   methylPREDNISolone (MEDROL DOSEPAK) 4 MG TBPK tablet Use as directed. 21 tablet 1   montelukast (SINGULAIR) 10 MG tablet Take 1 tablet (10 mg total) by mouth at bedtime. 30 tablet 3   ondansetron (ZOFRAN) 8 MG tablet One pill every 8 hours as needed for nausea/vomitting. 40 tablet 1   pantoprazole (PROTONIX) 40 MG tablet Take 1 tablet (40 mg total) by mouth daily. 90 tablet 0   polyethylene glycol (MIRALAX / GLYCOLAX) packet Take 17 g by mouth daily as needed for mild constipation. 14 each 0   pomalidomide (POMALYST) 4 MG capsule Take 1 capsule (4 mg total) by mouth daily. Take for 21 days, then old for 7 days. Repeat every 28 days. 21 capsule 0   potassium chloride SA (KLOR-CON M) 20 MEQ tablet Take 1 tablet (20 mEq total) by mouth 2 (two) times daily. 60 tablet 6   spironolactone (ALDACTONE) 25 MG tablet Take 1 tablet by mouth once daily 90 tablet 3   traMADol (ULTRAM) 50 MG tablet Take 1 tablet (50 mg total) by mouth every 12 (twelve) hours as needed. 60 tablet 0   Vitamin D, Ergocalciferol, (DRISDOL) 1.25 MG (50000 UNIT) CAPS capsule Take 1 capsule (50,000 Units total) by mouth once a week. 12 capsule 0   calcium-vitamin D (OSCAL WITH D) 500MG -200UNIT ( ) tablet Take 1 tablet by mouth 2 (two) times daily. (Patient not taking: Reported on 03/23/2023) 60 tablet 0   No  current facility-administered medications for this visit.   Facility-Administered Medications Ordered in Other Visits  Medication Dose Route Frequency Provider Last Rate Last Admin   0.9 %  sodium chloride infusion   Intravenous Continuous Ralene Muskrat, PA-C       0.9 %  sodium chloride infusion   Intravenous Once Earna Coder, MD       carfilzomib (KYPROLIS) 100 mg in dextrose 5 % 100 mL chemo infusion  56 mg/m2 (Treatment Plan Recorded) Intravenous Once Earna Coder, MD       dexamethasone (DECADRON) 20 mg in sodium chloride 0.9 % 50 mL IVPB  20 mg Intravenous Once Louretta Shorten R, MD 208 mL/hr at 03/30/23 1031 20 mg at 03/30/23 1031    PHYSICAL EXAMINATION:   BP 126/82 (BP Location: Right Arm, Patient Position: Sitting, Cuff Size: Large)   Pulse (!) 112   Temp 98.7 F (37.1 C) (Tympanic)   Ht 5\' 2"  (1.575 m)   Wt 171 lb 12.8 oz (  77.9 kg)   SpO2 100%   BMI 31.42 kg/m   Filed Weights   03/30/23 0910  Weight: 171 lb 12.8 oz (77.9 kg)    Mild leg swelling.   Physical Exam HENT:     Head: Normocephalic and atraumatic.  Eyes:     Pupils: Pupils are equal, round, and reactive to light.  Cardiovascular:     Rate and Rhythm: Normal rate and regular rhythm.  Pulmonary:     Effort: Pulmonary effort is normal. No respiratory distress.     Breath sounds: Normal breath sounds. No wheezing.  Abdominal:     General: Bowel sounds are normal. There is no distension.     Palpations: Abdomen is soft. There is no mass.     Tenderness: There is no abdominal tenderness. There is no guarding or rebound.  Musculoskeletal:        General: No tenderness. Normal range of motion.     Cervical back: Normal range of motion and neck supple.  Skin:    General: Skin is warm.  Neurological:     Mental Status: She is alert and oriented to person, place, and time.  Psychiatric:        Mood and Affect: Affect normal.    LABORATORY DATA:  I have reviewed the data as  listed    Component Value Date/Time   NA 136 03/30/2023 0918   NA 141 01/25/2020 1530   K 3.7 03/30/2023 0918   CL 107 03/30/2023 0918   CO2 20 (L) 03/30/2023 0918   GLUCOSE 116 (H) 03/30/2023 0918   BUN 21 03/30/2023 0918   BUN 19 01/25/2020 1530   CREATININE 0.92 03/30/2023 0918   CALCIUM 8.2 (L) 03/30/2023 0918   PROT 6.7 03/30/2023 0918   ALBUMIN 4.0 03/30/2023 0918   AST 26 03/30/2023 0918   ALT 16 03/30/2023 0918   ALKPHOS 76 03/30/2023 0918   BILITOT 0.3 03/30/2023 0918   GFRNONAA >60 03/30/2023 0918   GFRAA NOT CALCULATED 04/17/2020 1352    No results found for: "SPEP", "UPEP"  Lab Results  Component Value Date   WBC 4.2 03/30/2023   NEUTROABS 2.1 03/30/2023   HGB 10.5 (L) 03/30/2023   HCT 34.2 (L) 03/30/2023   MCV 97.4 03/30/2023   PLT 81 (L) 03/30/2023      Chemistry      Component Value Date/Time   NA 136 03/30/2023 0918   NA 141 01/25/2020 1530   K 3.7 03/30/2023 0918   CL 107 03/30/2023 0918   CO2 20 (L) 03/30/2023 0918   BUN 21 03/30/2023 0918   BUN 19 01/25/2020 1530   CREATININE 0.92 03/30/2023 0918      Component Value Date/Time   CALCIUM 8.2 (L) 03/30/2023 0918   ALKPHOS 76 03/30/2023 0918   AST 26 03/30/2023 0918   ALT 16 03/30/2023 0918   BILITOT 0.3 03/30/2023 0918      (H): Data is abnormally high   Latest Reference Range & Units Most Recent 12/24/20 14:01 04/01/21 14:34 04/28/21 09:17 06/11/21 09:42 07/03/21 10:32 08/20/21 10:06  M Protein SerPl Elph-Mcnc Not Observed g/dL 1.1 (H) (C) 1/61/09 60:45 Not Observed (C) 0.6 (H) (C) 0.7 (H) (C) 0.5 (H) (C) 0.7 (H) (C) 1.1 (H) (C)    Latest Reference Range & Units Most Recent 09/17/21 08:47 10/15/21 08:25 11/12/21 08:55  Kappa free light chain 3.3 - 19.4 mg/L 5.0 11/12/21 08:55 7.2 7.2 5.0  Lambda free light chains 5.7 - 26.3 mg/L 223.6 (H)  11/12/21 08:55 215.3 (H) 340.2 (H) 223.6 (H)  Kappa, lambda light chain ratio 0.26 - 1.65  0.02 (L) 11/12/21 08:55 0.03 (L) 0.02 (L) 0.02 (L)  (H):  Data is abnormally high (L): Data is abnormally low  RADIOGRAPHIC STUDIES: I have personally reviewed the radiological images as listed and agreed with the findings in the report. No results found.   ASSESSMENT & PLAN:  Multiple myeloma not having achieved remission (HCC) # RECURRENT Multiple myeloma stage III [high-risk cytogenetics-status post KRD-VGPR ; status post autologous stem cell transplant on 06/15/18.  Most recently progressed on salvage therapy with daratumumab  lenalidomide [15 mg 3 w; 1-w] dexamethasone. plasma cell myeloma by approximately 70% of the overall cellular marrow;  FEB 2024-lambda light chain 284 Bone marrow-70% plasma cells. M Protein 0.9-rising lambda light chain ratio. Discontinue Revlimid; and dara-given the progressive disease.  MARCH 2024- PET scan: No skeletal lesions noted. APRIl 2024- MRI-cervical/thoracic/lumbar spine-negative for any lesions in the spinal cord. DISCONTINUEPomalyst;- carfilzomib-dexamethasone- [rising M-protein; MAY 2024- 1.3. ] start 12/27/2022-Isa-Carfil-Dex [[facility]]  #  12/27/2022-Isa-Dex; d-2carfil weekly q 28 days  [[facility]] MARCH 2024-Echo [[facility]] Left ventricular EF 50 to 55%; AUG 13th, 2024- EF-45-50%.  #  Partial response noted.  Proceed with cycle #4 day-8  today.   # Cough/ intermittent dyspnea- no fevers or chills-recommend robitussin; add BNP to labs today; and HOLD off any Lasix ; continue nebs/inhaler.   # Electrolyte: Hypokalemia: mild-  continue Kdur TID; hypocalcemia- 8.0- vit D march 2024- 50- continie Ergo 50k weekly. REcommend ca BID stable. Recheck Vit D levels  # Iatrogenic hypothyroidism [ Graves/goiter s/p total thyroidectomy;aug,2022 ]-currently on Synthroid 150 mcg/day-[increased in AUG ]2024-  again recommend  taking the pills fasting- stable. Will repeat labs in sep 2024.   # Lower back discomfort x 6 months-see above.  MRI lumbar spine as above.  Continue tramadol.  Stable.  # Bone lesions /last zometa on 11/27/2017.  S/p    Dental extraction PET scan evidence of any bone lesions.-Hold Zometa for now.  Stable.  # Infection prophylaxis: Acyclovir; asprin- add IVIG infusion [IgG < 400]  # IV access: mediport- functional  # Vaccination: Flu shot s/p;  s/p Pneumonia vaccination [march 2024]- stable.   6 w- given dental extrac- on 1/26. :MM panel; K/l light chain ratio q 4 W  D-isa-dex;car1&2-car; weekly x 3; week 4-OFF;  q 28 day cycle; NO zometa- dental issues-ok; but hypocalemia  Vit D levels # DISPOSITION: # ADD BNP today- # as per IS-  chemo today; and tomorrow  # as per IS-  as planned # 1 week- MD labs-cbc;bmp;  D-1 isa-dex-car D-2 car # in 2  weeks ONLY IVIG infusion- # 3  week- MD labs-:cbc;bmp;;D-1 isa-dex-car D-2 car-  Dr.B   Orders Placed This Encounter  Procedures   CBC with Differential (Cancer Center Only)    Standing Status:   Future    Standing Expiration Date:   04/18/2024   CMP (Cancer Center only)    Standing Status:   Future    Standing Expiration Date:   04/18/2024   Brain natriuretic peptide    Standing Status:   Future    Number of Occurrences:   1    Standing Expiration Date:   03/29/2024   All questions were answered. The patient knows to call the clinic with any problems, questions or concerns.      Earna Coder, MD 03/30/2023 10:41 AM

## 2023-03-30 ENCOUNTER — Inpatient Hospital Stay: Payer: 59

## 2023-03-30 ENCOUNTER — Inpatient Hospital Stay (HOSPITAL_BASED_OUTPATIENT_CLINIC_OR_DEPARTMENT_OTHER): Payer: 59 | Admitting: Internal Medicine

## 2023-03-30 ENCOUNTER — Inpatient Hospital Stay: Payer: 59 | Attending: Nurse Practitioner

## 2023-03-30 ENCOUNTER — Other Ambulatory Visit: Payer: Self-pay

## 2023-03-30 ENCOUNTER — Encounter: Payer: Self-pay | Admitting: Internal Medicine

## 2023-03-30 VITALS — BP 126/82 | HR 112 | Temp 98.7°F | Ht 62.0 in | Wt 171.8 lb

## 2023-03-30 DIAGNOSIS — R600 Localized edema: Secondary | ICD-10-CM | POA: Diagnosis not present

## 2023-03-30 DIAGNOSIS — Z79620 Long term (current) use of immunosuppressive biologic: Secondary | ICD-10-CM | POA: Diagnosis not present

## 2023-03-30 DIAGNOSIS — Z9189 Other specified personal risk factors, not elsewhere classified: Secondary | ICD-10-CM

## 2023-03-30 DIAGNOSIS — C9000 Multiple myeloma not having achieved remission: Secondary | ICD-10-CM

## 2023-03-30 DIAGNOSIS — E032 Hypothyroidism due to medicaments and other exogenous substances: Secondary | ICD-10-CM | POA: Diagnosis not present

## 2023-03-30 DIAGNOSIS — C9002 Multiple myeloma in relapse: Secondary | ICD-10-CM | POA: Insufficient documentation

## 2023-03-30 DIAGNOSIS — Z23 Encounter for immunization: Secondary | ICD-10-CM | POA: Insufficient documentation

## 2023-03-30 DIAGNOSIS — Z5112 Encounter for antineoplastic immunotherapy: Secondary | ICD-10-CM | POA: Insufficient documentation

## 2023-03-30 LAB — CBC WITH DIFFERENTIAL (CANCER CENTER ONLY)
Abs Immature Granulocytes: 0.02 10*3/uL (ref 0.00–0.07)
Basophils Absolute: 0 10*3/uL (ref 0.0–0.1)
Basophils Relative: 1 %
Eosinophils Absolute: 0.1 10*3/uL (ref 0.0–0.5)
Eosinophils Relative: 2 %
HCT: 34.2 % — ABNORMAL LOW (ref 36.0–46.0)
Hemoglobin: 10.5 g/dL — ABNORMAL LOW (ref 12.0–15.0)
Immature Granulocytes: 1 %
Lymphocytes Relative: 39 %
Lymphs Abs: 1.6 10*3/uL (ref 0.7–4.0)
MCH: 29.9 pg (ref 26.0–34.0)
MCHC: 30.7 g/dL (ref 30.0–36.0)
MCV: 97.4 fL (ref 80.0–100.0)
Monocytes Absolute: 0.3 10*3/uL (ref 0.1–1.0)
Monocytes Relative: 8 %
Neutro Abs: 2.1 10*3/uL (ref 1.7–7.7)
Neutrophils Relative %: 49 %
Platelet Count: 81 10*3/uL — ABNORMAL LOW (ref 150–400)
RBC: 3.51 MIL/uL — ABNORMAL LOW (ref 3.87–5.11)
RDW: 15.5 % (ref 11.5–15.5)
WBC Count: 4.2 10*3/uL (ref 4.0–10.5)
nRBC: 0.7 % — ABNORMAL HIGH (ref 0.0–0.2)

## 2023-03-30 LAB — CMP (CANCER CENTER ONLY)
ALT: 16 U/L (ref 0–44)
AST: 26 U/L (ref 15–41)
Albumin: 4 g/dL (ref 3.5–5.0)
Alkaline Phosphatase: 76 U/L (ref 38–126)
Anion gap: 9 (ref 5–15)
BUN: 21 mg/dL (ref 8–23)
CO2: 20 mmol/L — ABNORMAL LOW (ref 22–32)
Calcium: 8.2 mg/dL — ABNORMAL LOW (ref 8.9–10.3)
Chloride: 107 mmol/L (ref 98–111)
Creatinine: 0.92 mg/dL (ref 0.44–1.00)
GFR, Estimated: >60 mL/min (ref >60)
Glucose, Bld: 116 mg/dL — ABNORMAL HIGH (ref 70–99)
Potassium: 3.7 mmol/L (ref 3.5–5.1)
Sodium: 136 mmol/L (ref 135–145)
Total Bilirubin: 0.3 mg/dL (ref 0.3–1.2)
Total Protein: 6.7 g/dL (ref 6.5–8.1)

## 2023-03-30 LAB — BRAIN NATRIURETIC PEPTIDE: B Natriuretic Peptide: 17.5 pg/mL (ref 0.0–100.0)

## 2023-03-30 LAB — VITAMIN D 25 HYDROXY (VIT D DEFICIENCY, FRACTURES): Vit D, 25-Hydroxy: 55.73 ng/mL (ref 30–100)

## 2023-03-30 MED ORDER — SODIUM CHLORIDE 0.9 % IV SOLN
Freq: Once | INTRAVENOUS | Status: AC
  Filled 2023-03-30: qty 250

## 2023-03-30 MED ORDER — HEPARIN SOD (PORK) LOCK FLUSH 100 UNIT/ML IV SOLN
500.0000 [IU] | Freq: Once | INTRAVENOUS | Status: AC | PRN
  Administered 2023-03-30: 500 [IU]
  Filled 2023-03-30: qty 5

## 2023-03-30 MED ORDER — DEXTROSE 5 % IV SOLN
56.0000 mg/m2 | Freq: Once | INTRAVENOUS | Status: AC
  Administered 2023-03-30: 100 mg via INTRAVENOUS
  Filled 2023-03-30: qty 30

## 2023-03-30 MED ORDER — SODIUM CHLORIDE 0.9 % IV SOLN
20.0000 mg | Freq: Once | INTRAVENOUS | Status: AC
  Administered 2023-03-30: 20 mg via INTRAVENOUS
  Filled 2023-03-30: qty 20

## 2023-03-30 MED FILL — Dexamethasone Sodium Phosphate Inj 100 MG/10ML: INTRAMUSCULAR | Qty: 2 | Status: AC

## 2023-03-30 NOTE — Progress Notes (Signed)
Coughing x1 week, feeling more SOB. Feeling like a pressure in center of chest.

## 2023-03-30 NOTE — Patient Instructions (Signed)

## 2023-03-31 ENCOUNTER — Inpatient Hospital Stay: Payer: 59

## 2023-03-31 ENCOUNTER — Encounter: Payer: Self-pay | Admitting: Internal Medicine

## 2023-03-31 ENCOUNTER — Other Ambulatory Visit: Payer: Self-pay

## 2023-03-31 VITALS — BP 107/80 | HR 57 | Temp 98.6°F | Resp 18

## 2023-03-31 DIAGNOSIS — Z5112 Encounter for antineoplastic immunotherapy: Secondary | ICD-10-CM | POA: Diagnosis not present

## 2023-03-31 DIAGNOSIS — C9000 Multiple myeloma not having achieved remission: Secondary | ICD-10-CM

## 2023-03-31 LAB — KAPPA/LAMBDA LIGHT CHAINS
Kappa free light chain: 3 mg/L — ABNORMAL LOW (ref 3.3–19.4)
Kappa, lambda light chain ratio: 0.51 (ref 0.26–1.65)
Lambda free light chains: 5.9 mg/L (ref 5.7–26.3)

## 2023-03-31 MED ORDER — SODIUM CHLORIDE 0.9 % IV SOLN
Freq: Once | INTRAVENOUS | Status: AC
  Filled 2023-03-31: qty 250

## 2023-03-31 MED ORDER — HEPARIN SOD (PORK) LOCK FLUSH 100 UNIT/ML IV SOLN
500.0000 [IU] | Freq: Once | INTRAVENOUS | Status: AC | PRN
  Administered 2023-03-31: 500 [IU]
  Filled 2023-03-31: qty 5

## 2023-03-31 MED ORDER — SODIUM CHLORIDE 0.9 % IV SOLN
20.0000 mg | Freq: Once | INTRAVENOUS | Status: AC
  Administered 2023-03-31: 20 mg via INTRAVENOUS
  Filled 2023-03-31: qty 20

## 2023-03-31 MED ORDER — DEXTROSE 5 % IV SOLN
56.0000 mg/m2 | Freq: Once | INTRAVENOUS | Status: AC
  Administered 2023-03-31: 100 mg via INTRAVENOUS
  Filled 2023-03-31: qty 30

## 2023-03-31 MED ORDER — SODIUM CHLORIDE 0.9% FLUSH
10.0000 mL | INTRAVENOUS | Status: DC | PRN
  Administered 2023-03-31: 10 mL
  Filled 2023-03-31: qty 10

## 2023-03-31 NOTE — Patient Instructions (Signed)
[facility]  Discharge Instructions: Thank you for choosing [facility] to provide your oncology and hematology care.  If you have a lab appointment with the Cancer Center, please go directly to the Cancer Center and check in at the registration area.  Wear comfortable clothing and clothing appropriate for easy access to any Portacath or PICC line.   We strive to give you quality time with your provider. You may need to reschedule your appointment if you arrive late (15 or more minutes).  Arriving late affects you and other patients whose appointments are after yours.  Also, if you miss three or more appointments without notifying the office, you may be dismissed from the clinic at the provider's discretion.      For prescription refill requests, have your pharmacy contact our office and allow 72 hours for refills to be completed.    Today you received the following chemotherapy and/or immunotherapy agents- kyprolis      To help prevent nausea and vomiting after your treatment, we encourage you to take your nausea medication as directed.  BELOW ARE SYMPTOMS THAT SHOULD BE REPORTED IMMEDIATELY: *FEVER GREATER THAN 100.4 F (38 C) OR HIGHER *CHILLS OR SWEATING *NAUSEA AND VOMITING THAT IS NOT CONTROLLED WITH YOUR NAUSEA MEDICATION *UNUSUAL SHORTNESS OF BREATH *UNUSUAL BRUISING OR BLEEDING *URINARY PROBLEMS (pain or burning when urinating, or frequent urination) *BOWEL PROBLEMS (unusual diarrhea, constipation, pain near the anus) TENDERNESS IN MOUTH AND THROAT WITH OR WITHOUT PRESENCE OF ULCERS (sore throat, sores in mouth, or a toothache) UNUSUAL RASH, SWELLING OR PAIN  UNUSUAL VAGINAL DISCHARGE OR ITCHING   Items with * indicate a potential emergency and should be followed up as soon as possible or go to the Emergency Department if any problems should occur.  Please show the CHEMOTHERAPY ALERT CARD or IMMUNOTHERAPY ALERT CARD at check-in to  the Emergency Department and triage nurse.  Should you have questions after your visit or need to cancel or reschedule your appointment, please contact [facility]  [phone] and follow the prompts.  Office hours are 8:00 a.m. to 4:30 p.m. Monday - Friday. Please note that voicemails left after 4:00 p.m. may not be returned until the following business day.  We are closed weekends and major holidays. You have access to a nurse at all times for urgent questions. Please call the main number to the clinic [phone] and follow the prompts.  For any non-urgent questions, you may also contact your provider using MyChart. We now offer e-Visits for anyone 18 and older to request care online for non-urgent symptoms. For details visit [email or web address].   Also download the MyChart app! Go to the app store, search "MyChart", open the app, select [facility], and log in with your MyChart username and password.    

## 2023-04-03 LAB — MULTIPLE MYELOMA PANEL, SERUM
Albumin SerPl Elph-Mcnc: 3.4 g/dL (ref 2.9–4.4)
Albumin/Glob SerPl: 1.4 (ref 0.7–1.7)
Alpha 1: 0.2 g/dL (ref 0.0–0.4)
Alpha2 Glob SerPl Elph-Mcnc: 0.7 g/dL (ref 0.4–1.0)
B-Globulin SerPl Elph-Mcnc: 1.2 g/dL (ref 0.7–1.3)
Gamma Glob SerPl Elph-Mcnc: 0.3 g/dL — ABNORMAL LOW (ref 0.4–1.8)
Globulin, Total: 2.5 g/dL (ref 2.2–3.9)
IgA: 33 mg/dL — ABNORMAL LOW (ref 64–422)
IgG (Immunoglobin G), Serum: 387 mg/dL — ABNORMAL LOW (ref 586–1602)
IgM (Immunoglobulin M), Srm: <5 mg/dL — ABNORMAL LOW (ref 26–217)
M Protein SerPl Elph-Mcnc: 0.1 g/dL — ABNORMAL HIGH
Total Protein ELP: 5.9 g/dL — ABNORMAL LOW (ref 6.0–8.5)

## 2023-04-04 MED FILL — Dexamethasone Sodium Phosphate Inj 100 MG/10ML: INTRAMUSCULAR | Qty: 2 | Status: AC

## 2023-04-05 ENCOUNTER — Inpatient Hospital Stay: Payer: 59

## 2023-04-05 ENCOUNTER — Encounter: Payer: Self-pay | Admitting: Internal Medicine

## 2023-04-05 ENCOUNTER — Inpatient Hospital Stay (HOSPITAL_BASED_OUTPATIENT_CLINIC_OR_DEPARTMENT_OTHER): Payer: 59 | Admitting: Internal Medicine

## 2023-04-05 VITALS — BP 140/80 | HR 75 | Temp 99.0°F | Resp 18 | Ht 62.0 in | Wt 170.0 lb

## 2023-04-05 DIAGNOSIS — C9000 Multiple myeloma not having achieved remission: Secondary | ICD-10-CM

## 2023-04-05 DIAGNOSIS — Z5112 Encounter for antineoplastic immunotherapy: Secondary | ICD-10-CM | POA: Diagnosis not present

## 2023-04-05 LAB — CBC WITH DIFFERENTIAL (CANCER CENTER ONLY)
Abs Immature Granulocytes: 0.03 10*3/uL (ref 0.00–0.07)
Basophils Absolute: 0 10*3/uL (ref 0.0–0.1)
Basophils Relative: 0 %
Eosinophils Absolute: 0.1 10*3/uL (ref 0.0–0.5)
Eosinophils Relative: 2 %
HCT: 30.6 % — ABNORMAL LOW (ref 36.0–46.0)
Hemoglobin: 9.5 g/dL — ABNORMAL LOW (ref 12.0–15.0)
Immature Granulocytes: 1 %
Lymphocytes Relative: 28 %
Lymphs Abs: 0.9 10*3/uL (ref 0.7–4.0)
MCH: 30 pg (ref 26.0–34.0)
MCHC: 31 g/dL (ref 30.0–36.0)
MCV: 96.5 fL (ref 80.0–100.0)
Monocytes Absolute: 0.2 10*3/uL (ref 0.1–1.0)
Monocytes Relative: 7 %
Neutro Abs: 2 10*3/uL (ref 1.7–7.7)
Neutrophils Relative %: 62 %
Platelet Count: 73 10*3/uL — ABNORMAL LOW (ref 150–400)
RBC: 3.17 MIL/uL — ABNORMAL LOW (ref 3.87–5.11)
RDW: 15 % (ref 11.5–15.5)
WBC Count: 3.2 10*3/uL — ABNORMAL LOW (ref 4.0–10.5)
nRBC: 1.2 % — ABNORMAL HIGH (ref 0.0–0.2)

## 2023-04-05 LAB — CMP (CANCER CENTER ONLY)
ALT: 23 U/L (ref 0–44)
AST: 15 U/L (ref 15–41)
Albumin: 3.6 g/dL (ref 3.5–5.0)
Alkaline Phosphatase: 72 U/L (ref 38–126)
Anion gap: 8 (ref 5–15)
BUN: 19 mg/dL (ref 8–23)
CO2: 20 mmol/L — ABNORMAL LOW (ref 22–32)
Calcium: 7.8 mg/dL — ABNORMAL LOW (ref 8.9–10.3)
Chloride: 111 mmol/L (ref 98–111)
Creatinine: 0.8 mg/dL (ref 0.44–1.00)
GFR, Estimated: >60 mL/min (ref >60)
Glucose, Bld: 99 mg/dL (ref 70–99)
Potassium: 3.3 mmol/L — ABNORMAL LOW (ref 3.5–5.1)
Sodium: 139 mmol/L (ref 135–145)
Total Bilirubin: 0.4 mg/dL (ref 0.3–1.2)
Total Protein: 6 g/dL — ABNORMAL LOW (ref 6.5–8.1)

## 2023-04-05 MED ORDER — SODIUM CHLORIDE 0.9 % IV SOLN
10.0000 mg/kg | Freq: Once | INTRAVENOUS | Status: AC
  Administered 2023-04-05: 800 mg via INTRAVENOUS
  Filled 2023-04-05: qty 25

## 2023-04-05 MED ORDER — DEXTROSE 5 % IV SOLN
56.0000 mg/m2 | Freq: Once | INTRAVENOUS | Status: AC
  Administered 2023-04-05: 100 mg via INTRAVENOUS
  Filled 2023-04-05: qty 30

## 2023-04-05 MED ORDER — DIPHENHYDRAMINE HCL 50 MG/ML IJ SOLN
50.0000 mg | Freq: Once | INTRAMUSCULAR | Status: AC
  Administered 2023-04-05: 50 mg via INTRAVENOUS
  Filled 2023-04-05: qty 1

## 2023-04-05 MED ORDER — SODIUM CHLORIDE 0.9 % IV SOLN
20.0000 mg | Freq: Once | INTRAVENOUS | Status: AC
  Administered 2023-04-05: 20 mg via INTRAVENOUS
  Filled 2023-04-05: qty 20

## 2023-04-05 MED ORDER — ACETAMINOPHEN 325 MG PO TABS
650.0000 mg | ORAL_TABLET | Freq: Once | ORAL | Status: AC
  Administered 2023-04-05: 650 mg via ORAL
  Filled 2023-04-05: qty 2

## 2023-04-05 MED ORDER — FAMOTIDINE IN NACL 20-0.9 MG/50ML-% IV SOLN
20.0000 mg | Freq: Once | INTRAVENOUS | Status: AC
  Administered 2023-04-05: 20 mg via INTRAVENOUS
  Filled 2023-04-05: qty 50

## 2023-04-05 MED ORDER — SODIUM CHLORIDE 0.9 % IV SOLN
Freq: Once | INTRAVENOUS | Status: AC
  Filled 2023-04-05: qty 250

## 2023-04-05 MED ORDER — SODIUM CHLORIDE 0.9% FLUSH
10.0000 mL | INTRAVENOUS | Status: DC | PRN
  Administered 2023-04-05: 10 mL via INTRAVENOUS
  Filled 2023-04-05: qty 10

## 2023-04-05 MED FILL — Dexamethasone Sodium Phosphate Inj 100 MG/10ML: INTRAMUSCULAR | Qty: 2 | Status: AC

## 2023-04-05 NOTE — Progress Notes (Signed)
[facility] OFFICE PROGRESS NOTE  Patient Care Team: Leanna Sato, MD as PCP - General (Family Medicine) End, Cristal Deer, MD as PCP - Cardiology (Cardiology) Eddie Candle, [name] Earna Coder, MD as Consulting Physician (Hematology and Oncology) Vida Rigger, MD as Consulting Physician (Pulmonary Disease) Tedd Sias Marlana Salvage, MD as Consulting Physician (Endocrinology)   Cancer Staging  No matching staging information was found for the patient.   Oncology History Overview Note  # SEP 2018- MULTIPLE MYELOMA IgALamda [2.5 gm/dl; K/L= 14/7829]; STAGE III [beta 2 microglobulin=5.5] [presented with acute renal failure; anemia; NO hypercalcemia; Skeletal survey-Normal]; BMBx- 45% plasma cells; FISH-POSITIVE 11:14 translocation.[STANDARD-high RISK]/cyto-Normal; SEP 2018- PET- L3 posterior element lesion.   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 No morphologic support for residual myeloma disease. Negative for minimal residual disease by MM-MRD flow cytometry; HOLD REVLIMID [leg swelling]; AUG 2021-PET scan negative for myeloma; continue to hold Revlimid  # MARCH 2021-diastolic congestive heart failure [Dr.End]  # BMBx- OCT 2021- BMBx- 5% plasmacytosis-however this appears to be more polyclonal rather than monoclonal-not explain patient worsening anemia.   # OCT 2022- 18th- Dara- Rev-Dex; MARCH, 2024- PROGRESSION bone marrow biopsy  70% plasma cells; PET scan no bone lesions-however conus medullaris uptake-MRI lumbar spine-   # MAY 2024- DISCONTINUE carfilzomib-dexamethasone- Pomalyst; [rising M-protein; MAY 2024- 1.3. ]   # start 12/27/2022 -Isa-Carfil-Dex   #November 2021-hyperthyroidism/goiter- [D.Bennett/Dr.Solum]-methimazole. S/p Thyroidectomy [Dr.Kim; [facility]-AUG 2022]  --------------------------  # 12/12- RIGHT JUGULAR DVT-x 59m on xarelto; finished April 2020; September 2020-EGD/dysphagia; Dr. Tobi Bastos  # Acute renal failure [Dr.Singh; Proteinuria 1.5gm/day ]; acyclovir/Asprin ------------------------------------------------------------------------------------------------------------   DIAGNOSIS: [ ]  MULTIPLE MYELOMA  STAGE: III/HIGH RISK ;GOALS: CONTROL      Multiple myeloma not having achieved remission (HCC)  11/21/2017 - 04/28/2018 Chemotherapy   Patient is on Treatment Plan : MYELOMA SALVAGE Cyclophosphamide / Carfilzomib / Dexamethasone (CCd) q28d     05/12/2021 - 02/25/2022 Chemotherapy   Patient is on Treatment Plan : MYELOMA RELAPSED REFRACTORY Daratumumab SQ + Lenalidomide + Dexamethasone (DaraRd) q28d     05/12/2021 - 07/23/2022 Chemotherapy   Patient is on Treatment Plan : MYELOMA RELAPSED REFRACTORY Daratumumab SQ + Lenalidomide + Dexamethasone (DaraRd) q28d     11/02/2022 - 11/16/2022 Chemotherapy   Patient is on Treatment Plan : MYELOMA RELAPSED/ REFRACTORY Carfilzomib D1,8,15 (20/27) + Pomalidomide + Dexamethasone (KPd) q28d     12/27/2022 -  Chemotherapy   Patient is on Treatment Plan : MYELOMA Carfilzomib + Dexamethasone + Isatuximab q28d      INTERVAL HISTORY: Ambulating  independently.   Alone.  Brandi [name] 72 y.o.  female pleasant patient above history of relapsed multiple myeloma--currently  [name]- Carfil-Dex is here for a follow up.  Patient denies any pain. Appetite is good. Has been having episodes of standing up too quickly and immediately having a warm sensation through her body.    Does have dyspnea and bending over makes it hard to breathe. Has some fatigue.   No fevers or chills. No infections. Denies any  worsening pain.  Patient taking tramadol as needed for upper and leg cramps. Mild tingling in feet.    Review of Systems  Constitutional:  Positive for malaise/fatigue. Negative for chills, diaphoresis and fever.  HENT:  Negative for nosebleeds and sore throat.   Eyes:  Negative for double vision.  Respiratory:  Negative for hemoptysis.   Cardiovascular:  Negative for chest pain, palpitations and orthopnea.  Gastrointestinal:  Negative for abdominal pain, blood in stool, constipation, heartburn and melena.  Genitourinary:  Negative for dysuria, frequency and urgency.  Musculoskeletal:  Positive for back pain.  Skin: Negative.  Negative for itching and rash.  Neurological:  Negative for dizziness, tingling, focal weakness, weakness and headaches.  Endo/Heme/Allergies:  Does not bruise/bleed easily.  Psychiatric/Behavioral:  Negative for depression. The patient is not nervous/anxious.     PAST MEDICAL HISTORY :  Past Medical History:  Diagnosis Date   (HFpEF) heart failure with preserved ejection fraction (HCC)    a. 06/2020 Echo: EF 60-65%, no rwma, nl RV size/fxn. Triv MR. Triv TR/AI. Mod elev PASP.   Anxiety    COPD (chronic obstructive pulmonary disease) (HCC)    GERD (gastroesophageal reflux disease)    Hypertension    Hyperthyroidism    Hypokalemia    IgA myeloma (HCC)    Multiple myeloma (HCC)    Pneumonia    Red blood cell antibody positive, compatible PRBC difficult to obtain    S/P autologous bone marrow transplantation (HCC)     PAST SURGICAL HISTORY :   Past Surgical History:  Procedure Laterality Date   ANTERIOR VITRECTOMY Left 10/07/2021   Procedure: ANTERIOR VITRECTOMY;  Surgeon: Lockie Mola, MD;  Location: [facility];  Service: Ophthalmology;  Laterality: Left;   CATARACT EXTRACTION W/PHACO Left 10/07/2021    Procedure: CATARACT EXTRACTION PHACO AND INTRAOCULAR LENS PLACEMENT (IOC) LEFT VISION BLUE;  Surgeon: Lockie Mola, MD;  Location: [facility];  Service: Ophthalmology;  Laterality: Left;  kit for manual incision 14.56 01:21.4   CATARACT EXTRACTION W/PHACO Right 10/21/2021   Procedure: CATARACT EXTRACTION PHACO AND INTRAOCULAR LENS PLACEMENT (IOC) RIGHT 11.12 01:48.1;  Surgeon: Lockie Mola, MD;  Location: [facility];  Service: Ophthalmology;  Laterality: Right;   ESOPHAGOGASTRODUODENOSCOPY (EGD) WITH PROPOFOL N/A 03/30/2019   Procedure: ESOPHAGOGASTRODUODENOSCOPY (EGD) WITH PROPOFOL;  Surgeon: Wyline Mood, MD;  Location: [facility] END[facility];  Service: Gastroenterology;  Laterality: N/A;   IR FLUORO GUIDE PORT INSERTION RIGHT  12/16/2017   IR IMAGING GUIDED PORT INSERTION  06/15/2021   KNEE ARTHROSCOPY Left 09/03/2022   Procedure: ARTHROSCOPY KNEE DEBRIDEMENT;  Surgeon: Lyndle Herrlich, MD;  Location: [facility] ORS;  Service: Orthopedics;  Laterality: Left;   THYROIDECTOMY N/A     FAMILY HISTORY :   Family History  Problem Relation Age of Onset   Pneumonia Mother    Seizures Father     SOCIAL HISTORY:   Social History   Tobacco Use   Smoking status: Former    Current packs/day: 0.00    Types: Cigarettes    Quit date: 03/18/2021    Years since quitting: 2.0   Smokeless tobacco: Never   Tobacco comments:    2 cigarettes QOD  Vaping Use   Vaping status: Never Used  Substance Use Topics   Alcohol use: No   Drug use: No    ALLERGIES:  has No Known Allergies.  MEDICATIONS:  Current Outpatient Medications  Medication Sig Dispense Refill   acyclovir (ZOVIRAX) 400 MG tablet Take 1 tablet by mouth twice daily  60 tablet 0   albuterol (VENTOLIN HFA) 108 (90 Base) MCG/ACT inhaler Inhale 2 puffs into the lungs every 6 (six) hours as needed for wheezing or shortness of breath. 8 g 3   aspirin EC 81 MG EC tablet Take 1 tablet (81 mg total) by mouth daily. 90 tablet 3    calcium-vitamin D (OSCAL WITH D) 500MG -200UNIT ( ) tablet Take 1 tablet by mouth 2 (two) times daily. 60 tablet 0   carvedilol (COREG) 3.125 MG tablet Take 1 tablet (3.125 mg total) by mouth 2 (two) times daily with a meal. 60 tablet 5   furosemide (LASIX) 40 MG tablet Take 1 tablet (40 mg total) by mouth daily. 90 tablet 3   levothyroxine (SYNTHROID) 150 MCG tablet Take 1 tablet (150 mcg total) by mouth daily before breakfast. Take before eating or drinking anything. DO Not  eat or drink until after an hour later after taking medication. 30 tablet 3   lidocaine-prilocaine (EMLA) cream Apply 1 application topically as needed. Apply to port and cover with saran wrap 1-2 hours prior to port access 30 g 1   magnesium oxide (MAG-OX) 400 (240 Mg) MG tablet Take 1 tablet (400 mg total) by mouth 2 (two) times daily. 60 tablet 3   methylPREDNISolone (MEDROL DOSEPAK) 4 MG TBPK tablet Use as directed. 21 tablet 1   montelukast (SINGULAIR) 10 MG tablet Take 1 tablet (10 mg total) by mouth at bedtime. 30 tablet 3   ondansetron (ZOFRAN) 8 MG tablet One pill every 8 hours as needed for nausea/vomitting. 40 tablet 1   pantoprazole (PROTONIX) 40 MG tablet Take 1 tablet (40 mg total) by mouth daily. 90 tablet 0   polyethylene glycol (MIRALAX / GLYCOLAX) packet Take 17 g by mouth daily as needed for mild constipation. 14 each 0   pomalidomide (POMALYST) 4 MG capsule Take 1 capsule (4 mg total) by mouth daily. Take for 21 days, then old for 7 days. Repeat every 28 days. 21 capsule 0   potassium chloride SA (KLOR-CON M) 20 MEQ tablet Take 1 tablet (20 mEq total) by mouth 2 (two) times daily. 60 tablet 6   spironolactone (ALDACTONE) 25 MG tablet Take 1 tablet by mouth once daily 90 tablet 3   traMADol (ULTRAM) 50 MG tablet Take 1 tablet (50 mg total) by mouth every 12 (twelve) hours as needed. 60 tablet 0   Vitamin D, Ergocalciferol, (DRISDOL) 1.25 MG (50000 UNIT) CAPS capsule Take 1 capsule (50,000 Units total) by  mouth once a week. 12 capsule 0   No current facility-administered medications for this visit.   Facility-Administered Medications Ordered in Other Visits  Medication Dose Route Frequency Provider Last Rate Last Admin   0.9 %  sodium chloride infusion   Intravenous Continuous Ralene Muskrat, PA-C       0.9 %  sodium chloride infusion   Intravenous Once Earna Coder, MD       acetaminophen (TYLENOL) tablet 650 mg  650 mg Oral Once Earna Coder, MD       carfilzomib (KYPROLIS) 100 mg in dextrose 5 % 100 mL chemo infusion  56 mg/m2 (Treatment Plan Recorded) Intravenous Once Earna Coder, MD       dexamethasone (DECADRON) 20 mg in sodium chloride 0.9 % 50 mL IVPB  20 mg Intravenous Once Earna Coder, MD 208 mL/hr at 04/05/23 0928 20 mg at 04/05/23 0928   diphenhydrAMINE (BENADRYL) injection 50 mg  50 mg Intravenous Once Earna Coder, MD  famotidine (PEPCID) IVPB 20 mg premix  20 mg Intravenous Once Earna Coder, MD       isatuximab-irfc (SARCLISA) 800 mg in sodium chloride 0.9 % 210 mL (3.2 mg/mL) chemo infusion  10 mg/kg (Treatment Plan Recorded) Intravenous Once Earna Coder, MD       sodium chloride flush (NS) 0.9 % injection 10 mL  10 mL Intravenous PRN Earna Coder, MD   10 mL at 04/05/23 0817    PHYSICAL EXAMINATION:   BP (!) 140/80 (BP Location: Left Arm, Patient Position: Sitting)   Pulse 75   Temp 99 F (37.2 C) (Tympanic)   Resp 18   Ht 5\' 2"  (1.575 m)   Wt 170 lb (77.1 kg)   SpO2 100%   BMI 31.09 kg/m   Filed Weights   04/05/23 0838  Weight: 170 lb (77.1 kg)    Mild leg swelling.   Physical Exam HENT:     Head: Normocephalic and atraumatic.  Eyes:     Pupils: Pupils are equal, round, and reactive to light.  Cardiovascular:     Rate and Rhythm: Normal rate and regular rhythm.  Pulmonary:     Effort: Pulmonary effort is normal. No respiratory distress.     Breath sounds: Normal breath  sounds. No wheezing.  Abdominal:     General: Bowel sounds are normal. There is no distension.     Palpations: Abdomen is soft. There is no mass.     Tenderness: There is no abdominal tenderness. There is no guarding or rebound.  Musculoskeletal:        General: No tenderness. Normal range of motion.     Cervical back: Normal range of motion and neck supple.  Skin:    General: Skin is warm.  Neurological:     Mental Status: She is alert and oriented to person, place, and time.  Psychiatric:        Mood and Affect: Affect normal.    LABORATORY DATA:  I have reviewed the data as listed    Component Value Date/Time   NA 139 04/05/2023 0817   NA 141 01/25/2020 1530   K 3.3 (L) 04/05/2023 0817   CL 111 04/05/2023 0817   CO2 20 (L) 04/05/2023 0817   GLUCOSE 99 04/05/2023 0817   BUN 19 04/05/2023 0817   BUN 19 01/25/2020 1530   CREATININE 0.80 04/05/2023 0817   CALCIUM 7.8 (L) 04/05/2023 0817   PROT 6.0 (L) 04/05/2023 0817   ALBUMIN 3.6 04/05/2023 0817   AST 15 04/05/2023 0817   ALT 23 04/05/2023 0817   ALKPHOS 72 04/05/2023 0817   BILITOT 0.4 04/05/2023 0817   GFRNONAA >60 04/05/2023 0817   GFRAA NOT CALCULATED 04/17/2020 1352    No results found for: "SPEP", "UPEP"  Lab Results  Component Value Date   WBC 3.2 (L) 04/05/2023   NEUTROABS 2.0 04/05/2023   HGB 9.5 (L) 04/05/2023   HCT 30.6 (L) 04/05/2023   MCV 96.5 04/05/2023   PLT 73 (L) 04/05/2023      Chemistry      Component Value Date/Time   NA 139 04/05/2023 0817   NA 141 01/25/2020 1530   K 3.3 (L) 04/05/2023 0817   CL 111 04/05/2023 0817   CO2 20 (L) 04/05/2023 0817   BUN 19 04/05/2023 0817   BUN 19 01/25/2020 1530   CREATININE 0.80 04/05/2023 0817      Component Value Date/Time   CALCIUM 7.8 (L) 04/05/2023 0817   ALKPHOS  72 04/05/2023 0817   AST 15 04/05/2023 0817   ALT 23 04/05/2023 0817   BILITOT 0.4 04/05/2023 0817      (H): Data is abnormally high   Latest Reference Range & Units Most  Recent 12/24/20 14:01 04/01/21 14:34 04/28/21 09:17 06/11/21 09:42 07/03/21 10:32 08/20/21 10:06  M Protein SerPl Elph-Mcnc Not Observed g/dL 1.1 (H) (C) 8/46/96 29:52 Not Observed (C) 0.6 (H) (C) 0.7 (H) (C) 0.5 (H) (C) 0.7 (H) (C) 1.1 (H) (C)    Latest Reference Range & Units Most Recent 09/17/21 08:47 10/15/21 08:25 11/12/21 08:55  Kappa free light chain 3.3 - 19.4 mg/L 5.0 11/12/21 08:55 7.2 7.2 5.0  Lambda free light chains 5.7 - 26.3 mg/L 223.6 (H) 11/12/21 08:55 215.3 (H) 340.2 (H) 223.6 (H)  Kappa, lambda light chain ratio 0.26 - 1.65  0.02 (L) 11/12/21 08:55 0.03 (L) 0.02 (L) 0.02 (L)  (H): Data is abnormally high (L): Data is abnormally low  RADIOGRAPHIC STUDIES: I have personally reviewed the radiological images as listed and agreed with the findings in the report. No results found.   ASSESSMENT & PLAN:  Multiple myeloma not having achieved remission (HCC) # RECURRENT Multiple myeloma stage III [high-risk cytogenetics-status post KRD-VGPR ; status post autologous stem cell transplant on 06/15/18.  Most recently progressed on salvage therapy with daratumumab  lenalidomide [15 mg 3 w; 1-w] dexamethasone. plasma cell myeloma by approximately 70% of the overall cellular marrow;  FEB 2024-lambda light chain 284 Bone marrow-70% plasma cells. M Protein 0.9-rising lambda light chain ratio. Discontinue Revlimid; and dara-given the progressive disease.  MARCH 2024- PET scan: No skeletal lesions noted. APRIl 2024- MRI-cervical/thoracic/lumbar spine-negative for any lesions in the spinal cord. DISCONTINUEPomalyst;- carfilzomib-dexamethasone- [rising M-protein; MAY 2024- 1.3. ] start 12/27/2022-Isa-Carfil-Dex [[facility]]  #  12/27/2022-Isa-Dex; d-2carfil weekly q 28 days  [[facility]] MARCH 2024-Echo [[facility]] Left ventricular EF 50 to 55%; AUG 13th, 2024- EF-45-50%.   #  Partial response noted.  Proceed with cycle #4 day-15  today. Stable.   # Cough/ intermittent dyspnea- no fevers or chills-recommend  robitussin; AUG BNP- wnl;  and HOLD off any Lasix ; continue nebs/inhaler. Stable.   # Electrolyte: Hypokalemia: mild-  continue Kdur TID; hypocalcemia- 8.0- vit D SEP 2024- 50- continie Ergo 50k weekly. REcommend ca BID stable.   # Iatrogenic hypothyroidism [ Graves/goiter s/p total thyroidectomy;aug,2022 ]-currently on Synthroid 150 mcg/day-[increased in AUG  ]2024-  again recommend  taking the pills fasting- stable. Will repeat labs in sep 2024.  Stable.   # Lower back discomfort x 6 months-see above.  MRI lumbar spine as above.  Continue tramadol.  Stable.  # Bone lesions /last zometa on 11/27/2017.  S/p   Dental extraction PET scan evidence of any bone lesions.-Hold Zometa for now.  Stable.  # Infection prophylaxis: Acyclovir; asprin- add IVIG infusion [IgG < 400]- plan  IVIG- 9/17.   # IV access: mediport- functional  # Vaccination: Flu shot s/p;  s/p Pneumonia vaccination [march 2024]- stable.   6 w- given dental extrac- on 1/26. :MM panel; K/l light chain ratio q 4 W  D-isa-dex;car1&2-car; weekly x 3; week 4-OFF;  q 28 day cycle; NO zometa- dental issues-ok; but hypocalemia  Vit D levels # DISPOSITION: # as per IS-  chemo today; and tomorrow  # as per IS-  as planned # 1 week IVIG # 2 week- MD labs-cbc;bmp; thyroid profile; MM panel; K/l light chains-  D-1 isa-dex-car D-2 car # 3  week- MD labs-:cbc;bmp;;D-1 isa-dex-car D-2  car-  Dr.B   Orders Placed This Encounter  Procedures   CBC with Differential (Cancer Center Only)    Standing Status:   Future    Standing Expiration Date:   04/25/2024   CMP (Cancer Center only)    Standing Status:   Future    Standing Expiration Date:   04/25/2024   Thyroid Panel With TSH    Standing Status:   Future    Standing Expiration Date:   04/04/2024   Multiple Myeloma Panel (SPEP&IFE w/QIG)    Standing Status:   Future    Standing Expiration Date:   04/04/2024   Kappa/lambda light chains    Standing Status:   Future    Standing  Expiration Date:   04/04/2024   All questions were answered. The patient knows to call the clinic with any problems, questions or concerns.      Earna Coder, MD 04/05/2023 9:34 AM

## 2023-04-05 NOTE — Patient Instructions (Signed)

## 2023-04-05 NOTE — Progress Notes (Signed)
Denies any pain. Appetite is good. Has been having episodes of standing up too quickly and immediately having a warm sensation through her body. Does have dyspnea and bending over makes it hard to breathe. Has some fatigue.

## 2023-04-05 NOTE — Assessment & Plan Note (Signed)
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  Most recently progressed on salvage therapy with daratumumab  lenalidomide [15 mg 3 w; 1-w] dexamethasone. plasma cell myeloma by approximately 70% of the overall cellular marrow;  FEB 2024-lambda light chain 284 Bone marrow-70% plasma cells. M Protein 0.9-rising lambda light chain ratio. Discontinue Revlimid; and dara-given the progressive disease.  MARCH 2024- PET scan: No skeletal lesions noted. APRIl 2024- MRI-cervical/thoracic/lumbar spine-negative for any lesions in the spinal cord. DISCONTINUEPomalyst;- carfilzomib-dexamethasone- [rising M-protein; MAY 2024- 1.3. ] start 12/27/2022-Isa-Carfil-Dex [[facility]]  #  12/27/2022-Isa-Dex; d-2carfil weekly q 28 days  [[facility]] MARCH 2024-Echo [[facility]] Left ventricular EF 50 to 55%; AUG 13th, 2024- EF-45-50%.   #  Partial response noted.  Proceed with cycle #4 day-15  today. Stable.   # Cough/ intermittent dyspnea- no fevers or chills-recommend robitussin; AUG BNP- wnl;  and HOLD off any Lasix ; continue nebs/inhaler. Stable.   # Electrolyte: Hypokalemia: mild-  continue Kdur TID; hypocalcemia- 8.0- vit D SEP 2024- 50- continie Ergo 50k weekly. REcommend ca BID stable.   # Iatrogenic hypothyroidism [ Graves/goiter s/p total thyroidectomy;aug,2022 ]-currently on Synthroid 150 mcg/day-[increased in AUG  ]2024-  again recommend  taking the pills fasting- stable. Will repeat labs in sep 2024.  Stable.   # Lower back discomfort x 6 months-see above.  MRI lumbar spine as above.  Continue tramadol.  Stable.  # Bone lesions /last zometa on 11/27/2017.  S/p   Dental extraction PET scan evidence of any bone lesions.-Hold Zometa for now.  Stable.  # Infection prophylaxis: Acyclovir; asprin- add IVIG infusion [IgG < 400]- plan  IVIG- 9/17.   # IV access: mediport- functional  # Vaccination: Flu shot s/p;  s/p Pneumonia vaccination  [march 2024]- stable.   6 w- given dental extrac- on 1/26. :MM panel; K/l light chain ratio q 4 W  D-isa-dex;car1&2-car; weekly x 3; week 4-OFF;  q 28 day cycle; NO zometa- dental issues-ok; but hypocalemia  Vit D levels # DISPOSITION: # as per IS-  chemo today; and tomorrow  # as per IS-  as planned # 1 week IVIG # 2 week- MD labs-cbc;bmp; thyroid profile; MM panel; K/l light chains-  D-1 isa-dex-car D-2 car # 3  week- MD labs-:cbc;bmp;;D-1 isa-dex-car D-2 car-  Dr.B

## 2023-04-06 ENCOUNTER — Other Ambulatory Visit: Payer: Self-pay

## 2023-04-06 ENCOUNTER — Inpatient Hospital Stay: Payer: 59

## 2023-04-06 VITALS — BP 135/75 | HR 83 | Temp 99.2°F | Resp 20

## 2023-04-06 DIAGNOSIS — Z5112 Encounter for antineoplastic immunotherapy: Secondary | ICD-10-CM | POA: Diagnosis not present

## 2023-04-06 DIAGNOSIS — C9000 Multiple myeloma not having achieved remission: Secondary | ICD-10-CM

## 2023-04-06 MED ORDER — HEPARIN SOD (PORK) LOCK FLUSH 100 UNIT/ML IV SOLN
500.0000 [IU] | Freq: Once | INTRAVENOUS | Status: AC
  Administered 2023-04-06: 500 [IU] via INTRAVENOUS
  Filled 2023-04-06: qty 5

## 2023-04-06 MED ORDER — SODIUM CHLORIDE 0.9 % IV SOLN
Freq: Once | INTRAVENOUS | Status: AC
  Filled 2023-04-06: qty 250

## 2023-04-06 MED ORDER — DEXTROSE 5 % IV SOLN
56.0000 mg/m2 | Freq: Once | INTRAVENOUS | Status: AC
  Administered 2023-04-06: 100 mg via INTRAVENOUS
  Filled 2023-04-06: qty 30

## 2023-04-06 MED ORDER — SODIUM CHLORIDE 0.9% FLUSH
10.0000 mL | INTRAVENOUS | Status: DC | PRN
  Administered 2023-04-06: 10 mL
  Filled 2023-04-06: qty 10

## 2023-04-06 MED ORDER — SODIUM CHLORIDE 0.9 % IV SOLN
20.0000 mg | Freq: Once | INTRAVENOUS | Status: AC
  Administered 2023-04-06: 20 mg via INTRAVENOUS
  Filled 2023-04-06: qty 20

## 2023-04-06 MED ORDER — HEPARIN SOD (PORK) LOCK FLUSH 100 UNIT/ML IV SOLN
500.0000 [IU] | Freq: Once | INTRAVENOUS | Status: AC | PRN
  Administered 2023-04-06: 500 [IU]
  Filled 2023-04-06: qty 5

## 2023-04-06 NOTE — Patient Instructions (Signed)
[facility]  Discharge Instructions: Thank you for choosing [facility] to provide your oncology and hematology care.  If you have a lab appointment with the Cancer Center, please go directly to the Cancer Center and check in at the registration area.  Wear comfortable clothing and clothing appropriate for easy access to any Portacath or PICC line.   We strive to give you quality time with your provider. You may need to reschedule your appointment if you arrive late (15 or more minutes).  Arriving late affects you and other patients whose appointments are after yours.  Also, if you miss three or more appointments without notifying the office, you may be dismissed from the clinic at the provider's discretion.      For prescription refill requests, have your pharmacy contact our office and allow 72 hours for refills to be completed.    Today you received the following chemotherapy and/or immunotherapy agents carfilzomib      To help prevent nausea and vomiting after your treatment, we encourage you to take your nausea medication as directed.  BELOW ARE SYMPTOMS THAT SHOULD BE REPORTED IMMEDIATELY: *FEVER GREATER THAN 100.4 F (38 C) OR HIGHER *CHILLS OR SWEATING *NAUSEA AND VOMITING THAT IS NOT CONTROLLED WITH YOUR NAUSEA MEDICATION *UNUSUAL SHORTNESS OF BREATH *UNUSUAL BRUISING OR BLEEDING *URINARY PROBLEMS (pain or burning when urinating, or frequent urination) *BOWEL PROBLEMS (unusual diarrhea, constipation, pain near the anus) TENDERNESS IN MOUTH AND THROAT WITH OR WITHOUT PRESENCE OF ULCERS (sore throat, sores in mouth, or a toothache) UNUSUAL RASH, SWELLING OR PAIN  UNUSUAL VAGINAL DISCHARGE OR ITCHING   Items with * indicate a potential emergency and should be followed up as soon as possible or go to the Emergency Department if any problems should occur.  Please show the CHEMOTHERAPY ALERT CARD or IMMUNOTHERAPY ALERT CARD at check-in  to the Emergency Department and triage nurse.  Should you have questions after your visit or need to cancel or reschedule your appointment, please contact [name]  [phone] and follow the prompts.  Office hours are 8:00 a.m. to 4:30 p.m. Monday - Friday. Please note that voicemails left after 4:00 p.m. may not be returned until the following business day.  We are closed weekends and major holidays. You have access to a nurse at all times for urgent questions. Please call the main number to the clinic [phone] and follow the prompts.  For any non-urgent questions, you may also contact your provider using MyChart. We now offer e-Visits for anyone 23 and older to request care online for non-urgent symptoms. For details visit [email or web address].   Also download the MyChart app! Go to the app store, search "MyChart", open the app, select , and log in with your MyChart username and password.

## 2023-04-13 ENCOUNTER — Inpatient Hospital Stay: Payer: 59

## 2023-04-13 VITALS — BP 141/67 | HR 97 | Temp 98.7°F | Resp 18 | Ht 62.0 in | Wt 172.0 lb

## 2023-04-13 DIAGNOSIS — E876 Hypokalemia: Secondary | ICD-10-CM

## 2023-04-13 DIAGNOSIS — C9000 Multiple myeloma not having achieved remission: Secondary | ICD-10-CM

## 2023-04-13 DIAGNOSIS — Z5112 Encounter for antineoplastic immunotherapy: Secondary | ICD-10-CM | POA: Diagnosis not present

## 2023-04-13 MED ORDER — DEXTROSE 5 % IV SOLN
Freq: Once | INTRAVENOUS | Status: AC
  Filled 2023-04-13: qty 250

## 2023-04-13 MED ORDER — HEPARIN SOD (PORK) LOCK FLUSH 100 UNIT/ML IV SOLN
500.0000 [IU] | Freq: Once | INTRAVENOUS | Status: AC | PRN
  Administered 2023-04-13: 500 [IU]
  Filled 2023-04-13: qty 5

## 2023-04-13 MED ORDER — IMMUNE GLOBULIN (HUMAN) 10 GM/100ML IV SOLN
400.0000 mg/kg | Freq: Once | INTRAVENOUS | Status: AC
  Administered 2023-04-13: 30 g via INTRAVENOUS
  Filled 2023-04-13: qty 300

## 2023-04-13 MED ORDER — ACETAMINOPHEN 325 MG PO TABS
650.0000 mg | ORAL_TABLET | Freq: Once | ORAL | Status: AC
  Administered 2023-04-13: 650 mg via ORAL
  Filled 2023-04-13: qty 2

## 2023-04-13 MED ORDER — DIPHENHYDRAMINE HCL 25 MG PO CAPS
25.0000 mg | ORAL_CAPSULE | Freq: Once | ORAL | Status: AC
  Administered 2023-04-13: 25 mg via ORAL
  Filled 2023-04-13: qty 1

## 2023-04-13 NOTE — Patient Instructions (Signed)
[facility]  Discharge Instructions: Thank you for choosing [facility] to provide your oncology and hematology care.  If you have a lab appointment with the Cancer Center, please go directly to the Cancer Center and check in at the registration area.  Wear comfortable clothing and clothing appropriate for easy access to any Portacath or PICC line.   We strive to give you quality time with your provider. You may need to reschedule your appointment if you arrive late (15 or more minutes).  Arriving late affects you and other patients whose appointments are after yours.  Also, if you miss three or more appointments without notifying the office, you may be dismissed from the clinic at the provider's discretion.      For prescription refill requests, have your pharmacy contact our office and allow 72 hours for refills to be completed.    Today you received the following chemotherapy and/or immunotherapy agents IVIG      To help prevent nausea and vomiting after your treatment, we encourage you to take your nausea medication as directed.  BELOW ARE SYMPTOMS THAT SHOULD BE REPORTED IMMEDIATELY: *FEVER GREATER THAN 100.4 F (38 C) OR HIGHER *CHILLS OR SWEATING *NAUSEA AND VOMITING THAT IS NOT CONTROLLED WITH YOUR NAUSEA MEDICATION *UNUSUAL SHORTNESS OF BREATH *UNUSUAL BRUISING OR BLEEDING *URINARY PROBLEMS (pain or burning when urinating, or frequent urination) *BOWEL PROBLEMS (unusual diarrhea, constipation, pain near the anus) TENDERNESS IN MOUTH AND THROAT WITH OR WITHOUT PRESENCE OF ULCERS (sore throat, sores in mouth, or a toothache) UNUSUAL RASH, SWELLING OR PAIN  UNUSUAL VAGINAL DISCHARGE OR ITCHING   Items with * indicate a potential emergency and should be followed up as soon as possible or go to the Emergency Department if any problems should occur.  Please show the CHEMOTHERAPY ALERT CARD or IMMUNOTHERAPY ALERT CARD at check-in to the  Emergency Department and triage nurse.  Should you have questions after your visit or need to cancel or reschedule your appointment, please contact [facility]  [phone] and follow the prompts.  Office hours are 8:00 a.m. to 4:30 p.m. Monday - Friday. Please note that voicemails left after 4:00 p.m. may not be returned until the following business day.  We are closed weekends and major holidays. You have access to a nurse at all times for urgent questions. Please call the main number to the clinic [phone] and follow the prompts.  For any non-urgent questions, you may also contact your provider using MyChart. We now offer e-Visits for anyone 65 and older to request care online for non-urgent symptoms. For details visit [email or web address].   Also download the MyChart app! Go to the app store, search "MyChart", open the app, select [place], and log in with your MyChart username and password.   Immune Globulin Injection What is this medication? IMMUNE GLOBULIN (im MUNE  GLOB yoo lin) helps to prevent or reduce the severity of certain infections in patients who are at risk. This medicine is collected from the pooled blood of many donors. It is used to treat immune system problems, thrombocytopenia, and Kawasaki syndrome. This medicine may be used for other purposes; ask your health care provider or pharmacist if you have questions. This medicine may be used for other purposes; ask your health care provider or pharmacist if you have questions. COMMON BRAND NAME(S): ASCENIV, Baygam, BIVIGAM, Carimune, Carimune NF, cutaquig, Cuvitru, Flebogamma, Flebogamma DIF, GamaSTAN, GamaSTAN S/D, Gamimune N, Gammagard, Gammagard  S/D, Gammaked, Gammaplex, Gammar-P IV, Gamunex, Gamunex-C, Hizentra, Iveegam, Iveegam EN, Octagam, Panglobulin, Panglobulin NF, panzyga, Polygam S/D, Privigen, Sandoglobulin, Venoglobulin-S, Vigam, Vivaglobulin, Xembify What should I tell my  care team before I take this medication? They need to know if you have any of these conditions: diabetes extremely low or no immune antibodies in the blood heart disease history of blood clots hyperprolinemia infection in the blood, sepsis kidney disease recently received or scheduled to receive a vaccination an unusual or allergic reaction to human immune globulin, albumin, maltose, sucrose, other medicines, foods, dyes, or preservatives pregnant or trying to get pregnant breast-feeding How should I use this medication? This medicine is for injection into a muscle or infusion into a vein or skin. It is usually given by a health care professional in a hospital or clinic setting. In rare cases, some brands of this medicine might be given at home. You will be taught how to give this medicine. Use exactly as directed. Take your medicine at regular intervals. Do not take your medicine more often than directed. Talk to your pediatrician regarding the use of this medicine in children. While this drug may be prescribed for selected conditions, precautions do apply. Overdosage: If you think you have taken too much of this medicine contact a poison control center or emergency room at once. NOTE: This medicine is only for you. Do not share this medicine with others. Overdosage: If you think you have taken too much of this medicine contact a poison control center or emergency room at once. NOTE: This medicine is only for you. Do not share this medicine with others. What if I miss a dose? It is important not to miss your dose. Call your doctor or health care professional if you are unable to keep an appointment. If you give yourself the medicine and you miss a dose, take it as soon as you can. If it is almost time for your next dose, take only that dose. Do not take double or extra doses. What may interact with this medication? aspirin and aspirin-like medicines cisplatin cyclosporine medicines for  infection like acyclovir, adefovir, amphotericin B, bacitracin, cidofovir, foscarnet, ganciclovir, gentamicin, pentamidine, vancomycin NSAIDS, medicines for pain and inflammation, like ibuprofen or naproxen pamidronate vaccines zoledronic acid This list may not describe all possible interactions. Give your health care provider a list of all the medicines, herbs, non-prescription drugs, or dietary supplements you use. Also tell them if you smoke, drink alcohol, or use illegal drugs. Some items may interact with your medicine. This list may not describe all possible interactions. Give your health care provider a list of all the medicines, herbs, non-prescription drugs, or dietary supplements you use. Also tell them if you smoke, drink alcohol, or use illegal drugs. Some items may interact with your medicine. What should I watch for while using this medication? Your condition will be monitored carefully while you are receiving this medicine. This medicine is made from pooled blood donations of many different people. It may be possible to pass an infection in this medicine. However, the donors are screened for infections and all products are tested for HIV and hepatitis. The medicine is treated to kill most or all bacteria and viruses. Talk to your doctor about the risks and benefits of this medicine. Do not have vaccinations for at least 14 days before, or until at least 3 months after receiving this medicine. What side effects may I notice from receiving this medication? Side effects that you should report to  your doctor or health care professional as soon as possible: allergic reactions like skin rash, itching or hives, swelling of the face, lips, or tongue blue colored lips or skin breathing problems chest pain or tightness fever signs and symptoms of aseptic meningitis such as stiff neck; sensitivity to light; headache; drowsiness; fever; nausea; vomiting; rash signs and symptoms of a blood clot  such as chest pain; shortness of breath; pain, swelling, or warmth in the leg signs and symptoms of hemolytic anemia such as fast heartbeat; tiredness; dark yellow or brown urine; or yellowing of the eyes or skin signs and symptoms of kidney injury like trouble passing urine or change in the amount of urine sudden weight gain swelling of the ankles, feet, hands Side effects that usually do not require medical attention (report to your doctor or health care professional if they continue or are bothersome): diarrhea flushing headache increased sweating joint pain muscle cramps muscle pain nausea pain, redness, or irritation at site where injected tiredness This list may not describe all possible side effects. Call your doctor for medical advice about side effects. You may report side effects to FDA at 1-800-FDA-1088. This list may not describe all possible side effects. Call your doctor for medical advice about side effects. You may report side effects to FDA at 1-800-FDA-1088. Where should I keep my medication? Keep out of the reach of children. This drug is usually given in a hospital or clinic and will not be stored at home. In rare cases, some brands of this medicine may be given at home. If you are using this medicine at home, you will be instructed on how to store this medicine. Throw away any unused medicine after the expiration date on the label. NOTE: This sheet is a summary. It may not cover all possible information. If you have questions about this medicine, talk to your doctor, pharmacist, or health care provider.  2024 [employer]vier/Gold Standard (2019-02-14 00:00:00)

## 2023-04-14 ENCOUNTER — Encounter: Payer: Self-pay | Admitting: Internal Medicine

## 2023-04-18 MED FILL — Dexamethasone Sodium Phosphate Inj 100 MG/10ML: INTRAMUSCULAR | Qty: 2 | Status: AC

## 2023-04-19 ENCOUNTER — Inpatient Hospital Stay: Payer: 59

## 2023-04-19 ENCOUNTER — Inpatient Hospital Stay (HOSPITAL_BASED_OUTPATIENT_CLINIC_OR_DEPARTMENT_OTHER): Payer: 59 | Admitting: Internal Medicine

## 2023-04-19 VITALS — BP 136/86 | HR 72 | Temp 96.6°F | Resp 20 | Wt 171.2 lb

## 2023-04-19 DIAGNOSIS — C9000 Multiple myeloma not having achieved remission: Secondary | ICD-10-CM

## 2023-04-19 DIAGNOSIS — Z5112 Encounter for antineoplastic immunotherapy: Secondary | ICD-10-CM | POA: Diagnosis not present

## 2023-04-19 LAB — CBC WITH DIFFERENTIAL (CANCER CENTER ONLY)
Abs Immature Granulocytes: 0.01 10*3/uL (ref 0.00–0.07)
Basophils Absolute: 0 10*3/uL (ref 0.0–0.1)
Basophils Relative: 1 %
Eosinophils Absolute: 0 10*3/uL (ref 0.0–0.5)
Eosinophils Relative: 1 %
HCT: 32.1 % — ABNORMAL LOW (ref 36.0–46.0)
Hemoglobin: 9.9 g/dL — ABNORMAL LOW (ref 12.0–15.0)
Immature Granulocytes: 0 %
Lymphocytes Relative: 47 %
Lymphs Abs: 1.4 10*3/uL (ref 0.7–4.0)
MCH: 29.6 pg (ref 26.0–34.0)
MCHC: 30.8 g/dL (ref 30.0–36.0)
MCV: 96.1 fL (ref 80.0–100.0)
Monocytes Absolute: 0.3 10*3/uL (ref 0.1–1.0)
Monocytes Relative: 11 %
Neutro Abs: 1.2 10*3/uL — ABNORMAL LOW (ref 1.7–7.7)
Neutrophils Relative %: 40 %
Platelet Count: 246 10*3/uL (ref 150–400)
RBC: 3.34 MIL/uL — ABNORMAL LOW (ref 3.87–5.11)
RDW: 15.2 % (ref 11.5–15.5)
WBC Count: 2.9 10*3/uL — ABNORMAL LOW (ref 4.0–10.5)
nRBC: 0 % (ref 0.0–0.2)

## 2023-04-19 LAB — CMP (CANCER CENTER ONLY)
ALT: 11 U/L (ref 0–44)
AST: 16 U/L (ref 15–41)
Albumin: 4.1 g/dL (ref 3.5–5.0)
Alkaline Phosphatase: 67 U/L (ref 38–126)
Anion gap: 7 (ref 5–15)
BUN: 15 mg/dL (ref 8–23)
CO2: 22 mmol/L (ref 22–32)
Calcium: 8.5 mg/dL — ABNORMAL LOW (ref 8.9–10.3)
Chloride: 110 mmol/L (ref 98–111)
Creatinine: 0.89 mg/dL (ref 0.44–1.00)
GFR, Estimated: >60 mL/min (ref >60)
Glucose, Bld: 96 mg/dL (ref 70–99)
Potassium: 3.6 mmol/L (ref 3.5–5.1)
Sodium: 139 mmol/L (ref 135–145)
Total Bilirubin: 0.6 mg/dL (ref 0.3–1.2)
Total Protein: 7.2 g/dL (ref 6.5–8.1)

## 2023-04-19 MED ORDER — DEXTROSE 5 % IV SOLN
56.0000 mg/m2 | Freq: Once | INTRAVENOUS | Status: AC
  Administered 2023-04-19: 100 mg via INTRAVENOUS
  Filled 2023-04-19: qty 30

## 2023-04-19 MED ORDER — SODIUM CHLORIDE 0.9 % IV SOLN
20.0000 mg | Freq: Once | INTRAVENOUS | Status: AC
  Administered 2023-04-19: 20 mg via INTRAVENOUS
  Filled 2023-04-19: qty 20

## 2023-04-19 MED ORDER — DIPHENHYDRAMINE HCL 50 MG/ML IJ SOLN
50.0000 mg | Freq: Once | INTRAMUSCULAR | Status: AC
  Administered 2023-04-19: 50 mg via INTRAVENOUS
  Filled 2023-04-19: qty 1

## 2023-04-19 MED ORDER — ACETAMINOPHEN 325 MG PO TABS
650.0000 mg | ORAL_TABLET | Freq: Once | ORAL | Status: AC
  Administered 2023-04-19: 650 mg via ORAL
  Filled 2023-04-19: qty 2

## 2023-04-19 MED ORDER — SODIUM CHLORIDE 0.9 % IV SOLN
Freq: Once | INTRAVENOUS | Status: AC
  Filled 2023-04-19: qty 250

## 2023-04-19 MED ORDER — INFLUENZA VAC A&B SURF ANT ADJ 0.5 ML IM SUSY
0.5000 mL | PREFILLED_SYRINGE | Freq: Once | INTRAMUSCULAR | Status: AC
  Administered 2023-04-19: 0.5 mL via INTRAMUSCULAR
  Filled 2023-04-19: qty 0.5

## 2023-04-19 MED ORDER — HEPARIN SOD (PORK) LOCK FLUSH 100 UNIT/ML IV SOLN
500.0000 [IU] | Freq: Once | INTRAVENOUS | Status: AC | PRN
  Administered 2023-04-19: 500 [IU]
  Filled 2023-04-19: qty 5

## 2023-04-19 MED ORDER — FAMOTIDINE IN NACL 20-0.9 MG/50ML-% IV SOLN
20.0000 mg | Freq: Once | INTRAVENOUS | Status: AC
  Administered 2023-04-19: 20 mg via INTRAVENOUS
  Filled 2023-04-19: qty 50

## 2023-04-19 MED ORDER — SODIUM CHLORIDE 0.9 % IV SOLN
10.0000 mg/kg | Freq: Once | INTRAVENOUS | Status: AC
  Administered 2023-04-19: 800 mg via INTRAVENOUS
  Filled 2023-04-19: qty 25

## 2023-04-19 MED FILL — Dexamethasone Sodium Phosphate Inj 100 MG/10ML: INTRAMUSCULAR | Qty: 2 | Status: AC

## 2023-04-19 NOTE — Progress Notes (Signed)
[facility] OFFICE PROGRESS NOTE  Patient Care Team: Leanna Sato, MD as PCP - General (Family Medicine) End, Cristal Deer, MD as PCP - Cardiology (Cardiology) Eddie Candle, [name] Earna Coder, MD as Consulting Physician (Hematology and Oncology) Vida Rigger, MD as Consulting Physician (Pulmonary Disease) Tedd Sias Marlana Salvage, MD as Consulting Physician (Endocrinology)   Cancer Staging  No matching staging information was found for the patient.   Oncology History Overview Note  # SEP 2018- MULTIPLE MYELOMA IgALamda [2.5 gm/dl; K/L= 30/8657]; STAGE III [beta 2 microglobulin=5.5] [presented with acute renal failure; anemia; NO hypercalcemia; Skeletal survey-Normal]; BMBx- 45% plasma cells; FISH-POSITIVE 11:14 translocation.[STANDARD-high RISK]/cyto-Normal; SEP 2018- PET- L3 posterior element lesion.   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 No morphologic support for residual myeloma disease. Negative for minimal residual disease by MM-MRD flow cytometry; HOLD REVLIMID [leg swelling]; AUG 2021-PET scan negative for myeloma; continue to hold Revlimid  # MARCH 2021-diastolic congestive heart failure [Dr.End]  # BMBx- OCT 2021- BMBx- 5% plasmacytosis-however this appears to be more polyclonal rather than monoclonal-not explain patient worsening anemia.   # OCT 2022- 18th- Dara- Rev-Dex; MARCH, 2024- PROGRESSION bone marrow biopsy  70% plasma cells; PET scan no bone lesions-however conus medullaris uptake-MRI lumbar spine-   # MAY 2024- DISCONTINUE carfilzomib-dexamethasone- Pomalyst; [rising M-protein; MAY 2024- 1.3. ]   # start 12/27/2022 -Isa-Carfil-Dex   #November 2021-hyperthyroidism/goiter- [D.Bennett/Dr.Solum]-methimazole. S/p Thyroidectomy [Dr.Kim; [facility]-AUG 2022]  --------------------------  # 12/12- RIGHT JUGULAR DVT-x 20m on xarelto; finished April 2020; September 2020-EGD/dysphagia; Dr. Tobi Bastos  # Acute renal failure [Dr.Singh; Proteinuria 1.5gm/day ]; acyclovir/Asprin ------------------------------------------------------------------------------------------------------------   DIAGNOSIS: [ ]  MULTIPLE MYELOMA  STAGE: III/HIGH RISK ;GOALS: CONTROL      Multiple myeloma not having achieved remission (HCC)  11/21/2017 - 04/28/2018 Chemotherapy   Patient is on Treatment Plan : MYELOMA SALVAGE Cyclophosphamide / Carfilzomib / Dexamethasone (CCd) q28d     05/12/2021 - 02/25/2022 Chemotherapy   Patient is on Treatment Plan : MYELOMA RELAPSED REFRACTORY Daratumumab SQ + Lenalidomide + Dexamethasone (DaraRd) q28d     05/12/2021 - 07/23/2022 Chemotherapy   Patient is on Treatment Plan : MYELOMA RELAPSED REFRACTORY Daratumumab SQ + Lenalidomide + Dexamethasone (DaraRd) q28d     11/02/2022 - 11/16/2022 Chemotherapy   Patient is on Treatment Plan : MYELOMA RELAPSED/ REFRACTORY Carfilzomib D1,8,15 (20/27) + Pomalidomide + Dexamethasone (KPd) q28d     12/27/2022 -  Chemotherapy   Patient is on Treatment Plan : MYELOMA Carfilzomib + Dexamethasone + Isatuximab q28d      INTERVAL HISTORY: Ambulating  independently.   Alone.  Brandi [name] 72 y.o.  female pleasant patient above history of relapsed multiple myeloma--currently  [name]- Carfil-Dex is here for a follow up.  Patient denies any pain. Appetite is good. Mild dyspnea - not any worse. Mild fatigue.   No fevers or chills. No infections. Denies any worsening pain.   Patient taking tramadol as needed for upper and leg cramps. Mild tingling in feet.    Review of Systems  Constitutional:  Positive for malaise/fatigue. Negative for chills, diaphoresis and fever.  HENT:  Negative for nosebleeds and sore throat.   Eyes:  Negative for double vision.  Respiratory:  Negative for hemoptysis.   Cardiovascular:  Negative for chest pain, palpitations and orthopnea.  Gastrointestinal:  Negative for abdominal pain, blood in stool, constipation, heartburn and melena.  Genitourinary:  Negative for dysuria, frequency and urgency.  Musculoskeletal:  Positive for back pain.  Skin: Negative.  Negative for itching and rash.  Neurological:  Negative for dizziness, tingling, focal weakness, weakness and headaches.  Endo/Heme/Allergies:  Does not bruise/bleed easily.  Psychiatric/Behavioral:  Negative for depression. The patient is not nervous/anxious.     PAST MEDICAL HISTORY :  Past Medical History:  Diagnosis Date   (HFpEF) heart failure with preserved ejection fraction (HCC)    a. 06/2020 Echo: EF 60-65%, no rwma, nl RV size/fxn. Triv MR. Triv TR/AI. Mod elev PASP.   Anxiety    COPD (chronic obstructive pulmonary disease) (HCC)    GERD (gastroesophageal reflux disease)    Hypertension    Hyperthyroidism    Hypokalemia    IgA myeloma (HCC)    Multiple myeloma (HCC)    Pneumonia    Red blood cell antibody positive, compatible PRBC difficult to obtain    S/P autologous bone marrow transplantation (HCC)     PAST SURGICAL HISTORY :   Past Surgical History:  Procedure Laterality Date   ANTERIOR VITRECTOMY Left 10/07/2021   Procedure: ANTERIOR VITRECTOMY;  Surgeon: Lockie Mola, MD;  Location: [facility];  Service: Ophthalmology;  Laterality: Left;   CATARACT EXTRACTION W/PHACO Left 10/07/2021   Procedure: CATARACT EXTRACTION PHACO AND INTRAOCULAR LENS PLACEMENT (IOC) LEFT VISION BLUE;  Surgeon: Lockie Mola, MD;  Location: [facility];  Service: Ophthalmology;  Laterality: Left;  kit for manual incision 14.56 01:21.4   CATARACT EXTRACTION W/PHACO Right 10/21/2021   Procedure: CATARACT EXTRACTION PHACO AND INTRAOCULAR LENS PLACEMENT (IOC) RIGHT 11.12 01:48.1;  Surgeon: Lockie Mola, MD;  Location: [facility];  Service: Ophthalmology;  Laterality: Right;   ESOPHAGOGASTRODUODENOSCOPY (EGD) WITH PROPOFOL N/A 03/30/2019   Procedure: ESOPHAGOGASTRODUODENOSCOPY (EGD) WITH PROPOFOL;  Surgeon: Wyline Mood, MD;  Location: [facility] ENDOSCOPY;  Service: Gastroenterology;  Laterality: N/A;   IR FLUORO GUIDE PORT INSERTION RIGHT  12/16/2017   IR IMAGING GUIDED PORT INSERTION  06/15/2021   KNEE ARTHROSCOPY Left 09/03/2022   Procedure: ARTHROSCOPY KNEE DEBRIDEMENT;  Surgeon: Lyndle Herrlich, MD;  Location: [facility] ORS;  Service: Orthopedics;  Laterality: Left;   THYROIDECTOMY N/A     FAMILY HISTORY :   Family History  Problem Relation Age of Onset   Pneumonia Mother    Seizures Father     SOCIAL HISTORY:   Social History   Tobacco Use   Smoking status: Former    Current packs/day: 0.00    Types: Cigarettes    Quit date: 03/18/2021    Years since quitting: 2.0   Smokeless tobacco: Never   Tobacco comments:    2 cigarettes QOD  Vaping Use   Vaping status: Never Used  Substance Use Topics   Alcohol use: No   Drug use: No    ALLERGIES:  has No Known Allergies.  MEDICATIONS:  Current Outpatient Medications  Medication Sig Dispense Refill   acyclovir (ZOVIRAX) 400 MG tablet Take 1 tablet by mouth twice daily 60 tablet 0   albuterol (VENTOLIN HFA) 108 (90 Base) MCG/ACT inhaler Inhale 2 puffs into the lungs every 6 (six) hours as needed for  wheezing or shortness of breath. 8 g 3   aspirin EC 81 MG EC tablet Take 1 tablet (81 mg total) by mouth daily. 90 tablet 3   calcium-vitamin D (OSCAL WITH D) 500MG -200UNIT ( ) tablet Take 1 tablet by mouth 2 (two) times daily. 60 tablet 0   carvedilol (COREG) 3.125 MG  tablet Take 1 tablet (3.125 mg total) by mouth 2 (two) times daily with a meal. 60 tablet 5   furosemide (LASIX) 40 MG tablet Take 1 tablet (40 mg total) by mouth daily. 90 tablet 3   levothyroxine (SYNTHROID) 150 MCG tablet Take 1 tablet (150 mcg total) by mouth daily before breakfast. Take before eating or drinking anything. DO Not  eat or drink until after an hour later after taking medication. 30 tablet 3   lidocaine-prilocaine (EMLA) cream Apply 1 application topically as needed. Apply to port and cover with saran wrap 1-2 hours prior to port access 30 g 1   magnesium oxide (MAG-OX) 400 (240 Mg) MG tablet Take 1 tablet (400 mg total) by mouth 2 (two) times daily. 60 tablet 3   methylPREDNISolone (MEDROL DOSEPAK) 4 MG TBPK tablet Use as directed. 21 tablet 1   montelukast (SINGULAIR) 10 MG tablet Take 1 tablet (10 mg total) by mouth at bedtime. 30 tablet 3   ondansetron (ZOFRAN) 8 MG tablet One pill every 8 hours as needed for nausea/vomitting. 40 tablet 1   pantoprazole (PROTONIX) 40 MG tablet Take 1 tablet (40 mg total) by mouth daily. 90 tablet 0   polyethylene glycol (MIRALAX / GLYCOLAX) packet Take 17 g by mouth daily as needed for mild constipation. 14 each 0   pomalidomide (POMALYST) 4 MG capsule Take 1 capsule (4 mg total) by mouth daily. Take for 21 days, then old for 7 days. Repeat every 28 days. 21 capsule 0   potassium chloride SA (KLOR-CON M) 20 MEQ tablet Take 1 tablet (20 mEq total) by mouth 2 (two) times daily. 60 tablet 6   spironolactone (ALDACTONE) 25 MG tablet Take 1 tablet by mouth once daily 90 tablet 3   traMADol (ULTRAM) 50 MG tablet Take 1 tablet (50 mg total) by mouth every 12 (twelve) hours as needed. 60 tablet 0   Vitamin D, Ergocalciferol, (DRISDOL) 1.25 MG (50000 UNIT) CAPS capsule Take 1 capsule (50,000 Units total) by mouth once a week. 12 capsule 0   No current facility-administered medications for this visit.   Facility-Administered Medications Ordered in Other  Visits  Medication Dose Route Frequency Provider Last Rate Last Admin   0.9 %  sodium chloride infusion   Intravenous Continuous Ralene Muskrat, PA-C       0.9 %  sodium chloride infusion   Intravenous Once Louretta Shorten R, MD       0.9 %  sodium chloride infusion   Intravenous Once Earna Coder, MD       acetaminophen (TYLENOL) tablet 650 mg  650 mg Oral Once Earna Coder, MD       carfilzomib (KYPROLIS) 100 mg in dextrose 5 % 100 mL chemo infusion  56 mg/m2 (Treatment Plan Recorded) Intravenous Once Earna Coder, MD       dexamethasone (DECADRON) 20 mg in sodium chloride 0.9 % 50 mL IVPB  20 mg Intravenous Once Louretta Shorten R, MD       diphenhydrAMINE (BENADRYL) injection 50 mg  50 mg Intravenous Once Earna Coder, MD       famotidine (PEPCID) IVPB 20 mg premix  20 mg Intravenous Once Earna Coder, MD       isatuximab-[facility]) 800 mg in sodium chloride 0.9 % 210 mL (3.2 mg/mL) chemo infusion  10 mg/kg (Treatment Plan Recorded) Intravenous Once Earna Coder, MD        PHYSICAL EXAMINATION:   BP 136/86   Pulse 72   Temp (!) 96.6 F (35.9 C)   Resp 20   Wt 171 lb 3.2 oz (77.7 kg)   SpO2 100%   BMI 31.31 kg/m   Filed Weights   04/19/23 0950  Weight: 171 lb 3.2 oz (77.7 kg)     Mild leg swelling.   Physical Exam HENT:     Head: Normocephalic and atraumatic.  Eyes:     Pupils: Pupils are equal, round, and reactive to light.  Cardiovascular:     Rate and Rhythm: Normal rate and regular rhythm.  Pulmonary:     Effort: Pulmonary effort is normal. No respiratory distress.     Breath sounds: Normal breath sounds. No wheezing.  Abdominal:     General: Bowel sounds are normal. There is no distension.     Palpations: Abdomen is soft. There is no mass.     Tenderness: There is no abdominal tenderness. There is no guarding or rebound.  Musculoskeletal:        General: No tenderness. Normal range of motion.      Cervical back: Normal range of motion and neck supple.  Skin:    General: Skin is warm.  Neurological:     Mental Status: She is alert and oriented to person, place, and time.  Psychiatric:        Mood and Affect: Affect normal.    LABORATORY DATA:  I have reviewed the data as listed    Component Value Date/Time   NA 139 04/19/2023 0917   NA 141 01/25/2020 1530   K 3.6 04/19/2023 0917   CL 110 04/19/2023 0917   CO2 22 04/19/2023 0917   GLUCOSE 96 04/19/2023 0917   BUN 15 04/19/2023 0917   BUN 19 01/25/2020 1530   CREATININE 0.89 04/19/2023 0917   CALCIUM 8.5 (L) 04/19/2023 0917   PROT 7.2 04/19/2023 0917   ALBUMIN 4.1 04/19/2023 0917   AST 16 04/19/2023 0917   ALT 11 04/19/2023 0917   ALKPHOS 67 04/19/2023 0917   BILITOT 0.6 04/19/2023 0917   GFRNONAA >60 04/19/2023 0917   GFRAA NOT CALCULATED 04/17/2020 1352    No results found for: "SPEP", "UPEP"  Lab Results  Component Value Date   WBC 2.9 (L) 04/19/2023   NEUTROABS 1.2 (L) 04/19/2023   HGB 9.9 (L) 04/19/2023   HCT 32.1 (L) 04/19/2023   MCV 96.1 04/19/2023   PLT 246 04/19/2023      Chemistry      Component Value Date/Time   NA 139 04/19/2023 0917   NA 141 01/25/2020 1530   K 3.6 04/19/2023 0917   CL 110 04/19/2023 0917   CO2 22 04/19/2023 0917   BUN 15 04/19/2023 0917   BUN 19 01/25/2020 1530   CREATININE 0.89 04/19/2023 0917      Component Value Date/Time   CALCIUM 8.5 (L) 04/19/2023 0917   ALKPHOS 67 04/19/2023 0917   AST 16 04/19/2023 0917   ALT 11 04/19/2023 0917   BILITOT 0.6 04/19/2023 0917      (H): Data is abnormally high   Latest Reference Range & Units Most Recent 12/24/20 14:01 04/01/21 14:34 04/28/21 09:17 06/11/21 09:42 07/03/21  10:32 08/20/21 10:06  M Protein SerPl Elph-Mcnc Not Observed g/dL 1.1 (H) (C) 2/72/53 66:44 Not Observed (C) 0.6 (H) (C) 0.7 (H) (C) 0.5 (H) (C) 0.7 (H) (C) 1.1 (H) (C)    Latest Reference Range & Units Most Recent 09/17/21 08:47 10/15/21 08:25 11/12/21  08:55  Kappa free light chain 3.3 - 19.4 mg/L 5.0 11/12/21 08:55 7.2 7.2 5.0  Lambda free light chains 5.7 - 26.3 mg/L 223.6 (H) 11/12/21 08:55 215.3 (H) 340.2 (H) 223.6 (H)  Kappa, lambda light chain ratio 0.26 - 1.65  0.02 (L) 11/12/21 08:55 0.03 (L) 0.02 (L) 0.02 (L)  (H): Data is abnormally high (L): Data is abnormally low  RADIOGRAPHIC STUDIES: I have personally reviewed the radiological images as listed and agreed with the findings in the report. No results found.   ASSESSMENT & PLAN:  Multiple myeloma not having achieved remission (HCC) # RECURRENT Multiple myeloma stage III [high-risk cytogenetics-status post KRD-VGPR ; status post autologous stem cell transplant on 06/15/18.  Most recently progressed on salvage therapy with daratumumab  lenalidomide [15 mg 3 w; 1-w] dexamethasone. plasma cell myeloma by approximately 70% of the overall cellular marrow;  FEB 2024-lambda light chain 284 Bone marrow-70% plasma cells. M Protein 0.9-rising lambda light chain ratio. Discontinue Revlimid; and dara-given the progressive disease.  MARCH 2024- PET scan: No skeletal lesions noted. APRIl 2024- MRI-cervical/thoracic/lumbar spine-negative for any lesions in the spinal cord. DISCONTINUEPomalyst;- carfilzomib-dexamethasone- [rising M-protein; MAY 2024- 1.3. ] start 12/27/2022-Isa-Carfil-Dex [[facility]]  #  12/27/2022-Isa-Dex; d-2carfil weekly q 28 days  [[facility]] MARCH 2024-Echo [[facility]] Left ventricular EF 50 to 55%; AUG 13th, 2024- EF-45-50%.   #  Partial response noted.  Proceed with cycle #5 day-1 today. Stable.   # Cough/ intermittent dyspnea- no fevers or chills-recommend robitussin; AUG BNP- wnl;  and HOLD off any Lasix ; continue nebs/inhaler. Stable.   # Electrolyte: Hypokalemia: mild-  continue Kdur TID; hypocalcemia- 8.0- vit D SEP 2024- 50- continie Ergo 50k weekly. REcommend ca BID  Stable.   # Iatrogenic hypothyroidism [ Graves/goiter s/p total thyroidectomy;aug,2022 ]-currently on Synthroid 150  mcg/day-[increased in AUG  ]2024-  again recommend  taking the pills fasting- stable.  sep 2024- Vit D 54. .  Stable.   # Lower back discomfort x 6 months-see above.  MRI lumbar spine as above.  Continue tramadol.  Stable.   # Bone lesions /last zometa on 11/27/2017.  S/p   Dental extraction PET scan evidence of any bone lesions.-Hold Zometa for now.  Stable.   # Infection prophylaxis: Acyclovir; asprin- add IVIG infusion [IgG < 400]- plan  IVIG- q 4 w #1 on- 9/17-  Stable. .   # IV access: mediport- functional  # Vaccination: Flu shot today [sep 2024] s/p Pneumonia vaccination [march 2024]- stable.   6 w- given dental extrac- on 1/26. :MM panel; K/l light chain ratio q 4 W  D-isa-dex;car1&2-car; weekly x 3; week 4-OFF;  q 28 day cycle; NO zometa- dental issues-ok; but hypocalemia  # DISPOSITION: # Flu shot today # as per IS-  chemo today; and tomorrow  # as per IS-  as planned # 1 week- MD labs-cbc;bmp;  D-1 isa-dex-car D-2 car # 2 week- MD labs-:cbc;bmp;D-1 isa-dex-car D-2 car-  # 3 week- NO labs- NO MD; ONLY  IVIG Dr.B   Orders Placed This Encounter  Procedures   CBC with Differential (Cancer Center Only)    Standing Status:   Future    Standing Expiration Date:   05/02/2024  CMP (Cancer Center only)    Standing Status:   Future    Standing Expiration Date:   05/02/2024   All questions were answered. The patient knows to call the clinic with any problems, questions or concerns.      Earna Coder, MD 04/19/2023 10:52 AM

## 2023-04-19 NOTE — Assessment & Plan Note (Addendum)
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  Most recently progressed on salvage therapy with daratumumab  lenalidomide [15 mg 3 w; 1-w] dexamethasone. plasma cell myeloma by approximately 70% of the overall cellular marrow;  FEB 2024-lambda light chain 284 Bone marrow-70% plasma cells. M Protein 0.9-rising lambda light chain ratio. Discontinue Revlimid; and dara-given the progressive disease.  MARCH 2024- PET scan: No skeletal lesions noted. APRIl 2024- MRI-cervical/thoracic/lumbar spine-negative for any lesions in the spinal cord. DISCONTINUEPomalyst;- carfilzomib-dexamethasone- [rising M-protein; MAY 2024- 1.3. ] start 12/27/2022-Isa-Carfil-Dex [[facility]]  #  12/27/2022-Isa-Dex; d-2carfil weekly q 28 days  [[facility]] MARCH 2024-Echo [[facility]] Left ventricular EF 50 to 55%; AUG 13th, 2024- EF-45-50%.   #  Partial response noted.  Proceed with cycle #5 day-1 today. Stable.   # Cough/ intermittent dyspnea- no fevers or chills-recommend robitussin; AUG BNP- wnl;  and HOLD off any Lasix ; continue nebs/inhaler. Stable.   # Electrolyte: Hypokalemia: mild-  continue Kdur TID; hypocalcemia- 8.0- vit D SEP 2024- 50- continie Ergo 50k weekly. REcommend ca BID  Stable.   # Iatrogenic hypothyroidism [ Graves/goiter s/p total thyroidectomy;aug,2022 ]-currently on Synthroid 150 mcg/day-[increased in AUG  ]2024-  again recommend  taking the pills fasting- stable.  sep 2024- Vit D 54. .  Stable.   # Lower back discomfort x 6 months-see above.  MRI lumbar spine as above.  Continue tramadol.  Stable.   # Bone lesions /last zometa on 11/27/2017.  S/p   Dental extraction PET scan evidence of any bone lesions.-Hold Zometa for now.  Stable.   # Infection prophylaxis: Acyclovir; asprin- add IVIG infusion [IgG < 400]- plan  IVIG- q 4 w #1 on- 9/17-  Stable. .   # IV access: mediport- functional  # Vaccination: Flu shot today [sep 2024]  s/p Pneumonia vaccination [march 2024]- stable.   6 w- given dental extrac- on 1/26. :MM panel; K/l light chain ratio q 4 W  D-isa-dex;car1&2-car; weekly x 3; week 4-OFF;  q 28 day cycle; NO zometa- dental issues-ok; but hypocalemia  # DISPOSITION: # Flu shot today # as per IS-  chemo today; and tomorrow  # as per IS-  as planned # 1 week- MD labs-cbc;bmp;  D-1 isa-dex-car D-2 car # 2 week- MD labs-:cbc;bmp;D-1 isa-dex-car D-2 car-  # 3 week- NO labs- NO MD; ONLY  IVIG Dr.B

## 2023-04-19 NOTE — Patient Instructions (Signed)
[place] [facility]  Discharge Instructions: Thank you for choosing [facility] to provide your oncology and hematology care.  If you have a lab appointment with the Cancer Center, please go directly to the Cancer Center and check in at the registration area.  Wear comfortable clothing and clothing appropriate for easy access to any Portacath or PICC line.   We strive to give you quality time with your provider. You may need to reschedule your appointment if you arrive late (15 or more minutes).  Arriving late affects you and other patients whose appointments are after yours.  Also, if you miss three or more appointments without notifying the office, you may be dismissed from the clinic at the provider's discretion.      For prescription refill requests, have your pharmacy contact our office and allow 72 hours for refills to be completed.    Today you received the following chemotherapy and/or immunotherapy agents sarclisa and kyprolis      To help prevent nausea and vomiting after your treatment, we encourage you to take your nausea medication as directed.  BELOW ARE SYMPTOMS THAT SHOULD BE REPORTED IMMEDIATELY: *FEVER GREATER THAN 100.4 F (38 C) OR HIGHER *CHILLS OR SWEATING *NAUSEA AND VOMITING THAT IS NOT CONTROLLED WITH YOUR NAUSEA MEDICATION *UNUSUAL SHORTNESS OF BREATH *UNUSUAL BRUISING OR BLEEDING *URINARY PROBLEMS (pain or burning when urinating, or frequent urination) *BOWEL PROBLEMS (unusual diarrhea, constipation, pain near the anus) TENDERNESS IN MOUTH AND THROAT WITH OR WITHOUT PRESENCE OF ULCERS (sore throat, sores in mouth, or a toothache) UNUSUAL RASH, SWELLING OR PAIN  UNUSUAL VAGINAL DISCHARGE OR ITCHING   Items with * indicate a potential emergency and should be followed up as soon as possible or go to the Emergency Department if any problems should occur.  Please show the CHEMOTHERAPY ALERT CARD or IMMUNOTHERAPY ALERT CARD at  check-in to the Emergency Department and triage nurse.  Should you have questions after your visit or need to cancel or reschedule your appointment, please contact [place] [facility]  [phone] and follow the prompts.  Office hours are 8:00 a.m. to 4:30 p.m. Monday - Friday. Please note that voicemails left after 4:00 p.m. may not be returned until the following business day.  We are closed weekends and major holidays. You have access to a nurse at all times for urgent questions. Please call the main number to the clinic [phone] and follow the prompts.  For any non-urgent questions, you may also contact your provider using MyChart. We now offer e-Visits for anyone 70 and older to request care online for non-urgent symptoms. For details visit [email or web address].   Also download the MyChart app! Go to the app store, search "MyChart", open the app, select [place], and log in with your MyChart username and password.

## 2023-04-19 NOTE — Progress Notes (Signed)
Patient has no concerns 

## 2023-04-20 ENCOUNTER — Inpatient Hospital Stay: Payer: 59

## 2023-04-20 VITALS — BP 148/69 | HR 60 | Temp 98.9°F | Resp 16

## 2023-04-20 DIAGNOSIS — C9000 Multiple myeloma not having achieved remission: Secondary | ICD-10-CM

## 2023-04-20 DIAGNOSIS — Z5112 Encounter for antineoplastic immunotherapy: Secondary | ICD-10-CM | POA: Diagnosis not present

## 2023-04-20 LAB — THYROID PANEL WITH TSH
Free Thyroxine Index: 1.6 (ref 1.2–4.9)
T3 Uptake Ratio: 21 % — ABNORMAL LOW (ref 24–39)
T4, Total: 7.8 ug/dL (ref 4.5–12.0)
TSH: 11 u[IU]/mL — ABNORMAL HIGH (ref 0.450–4.500)

## 2023-04-20 MED ORDER — SODIUM CHLORIDE 0.9 % IV SOLN
20.0000 mg | Freq: Once | INTRAVENOUS | Status: AC
  Administered 2023-04-20: 20 mg via INTRAVENOUS
  Filled 2023-04-20: qty 20

## 2023-04-20 MED ORDER — SODIUM CHLORIDE 0.9% FLUSH
10.0000 mL | INTRAVENOUS | Status: DC | PRN
  Administered 2023-04-20: 10 mL
  Filled 2023-04-20: qty 10

## 2023-04-20 MED ORDER — DEXTROSE 5 % IV SOLN
56.0000 mg/m2 | Freq: Once | INTRAVENOUS | Status: AC
  Administered 2023-04-20: 100 mg via INTRAVENOUS
  Filled 2023-04-20: qty 30

## 2023-04-20 MED ORDER — SODIUM CHLORIDE 0.9 % IV SOLN
Freq: Once | INTRAVENOUS | Status: AC
  Filled 2023-04-20: qty 250

## 2023-04-20 MED ORDER — HEPARIN SOD (PORK) LOCK FLUSH 100 UNIT/ML IV SOLN
500.0000 [IU] | Freq: Once | INTRAVENOUS | Status: AC | PRN
  Administered 2023-04-20: 500 [IU]
  Filled 2023-04-20: qty 5

## 2023-04-20 NOTE — Patient Instructions (Signed)
[facility]  Discharge Instructions: Thank you for choosing [facility] to provide your oncology and hematology care.  If you have a lab appointment with the Cancer Center, please go directly to the Cancer Center and check in at the registration area.  Wear comfortable clothing and clothing appropriate for easy access to any Portacath or PICC line.   We strive to give you quality time with your provider. You may need to reschedule your appointment if you arrive late (15 or more minutes).  Arriving late affects you and other patients whose appointments are after yours.  Also, if you miss three or more appointments without notifying the office, you may be dismissed from the clinic at the provider's discretion.      For prescription refill requests, have your pharmacy contact our office and allow 72 hours for refills to be completed.    Today you received the following chemotherapy and/or immunotherapy agents- kyprolis      To help prevent nausea and vomiting after your treatment, we encourage you to take your nausea medication as directed.  BELOW ARE SYMPTOMS THAT SHOULD BE REPORTED IMMEDIATELY: *FEVER GREATER THAN 100.4 F (38 C) OR HIGHER *CHILLS OR SWEATING *NAUSEA AND VOMITING THAT IS NOT CONTROLLED WITH YOUR NAUSEA MEDICATION *UNUSUAL SHORTNESS OF BREATH *UNUSUAL BRUISING OR BLEEDING *URINARY PROBLEMS (pain or burning when urinating, or frequent urination) *BOWEL PROBLEMS (unusual diarrhea, constipation, pain near the anus) TENDERNESS IN MOUTH AND THROAT WITH OR WITHOUT PRESENCE OF ULCERS (sore throat, sores in mouth, or a toothache) UNUSUAL RASH, SWELLING OR PAIN  UNUSUAL VAGINAL DISCHARGE OR ITCHING   Items with * indicate a potential emergency and should be followed up as soon as possible or go to the Emergency Department if any problems should occur.  Please show the CHEMOTHERAPY ALERT CARD or IMMUNOTHERAPY ALERT CARD at check-in to  the Emergency Department and triage nurse.  Should you have questions after your visit or need to cancel or reschedule your appointment, please contact [facility]  [phone] and follow the prompts.  Office hours are 8:00 a.m. to 4:30 p.m. Monday - Friday. Please note that voicemails left after 4:00 p.m. may not be returned until the following business day.  We are closed weekends and major holidays. You have access to a nurse at all times for urgent questions. Please call the main number to the clinic [phone] and follow the prompts.  For any non-urgent questions, you may also contact your provider using MyChart. We now offer e-Visits for anyone 48 and older to request care online for non-urgent symptoms. For details visit [email or web address].   Also download the MyChart app! Go to the app store, search "MyChart", open the app, select , and log in with your MyChart username and password.

## 2023-04-21 ENCOUNTER — Other Ambulatory Visit: Payer: Self-pay

## 2023-04-21 ENCOUNTER — Encounter: Payer: Self-pay | Admitting: Internal Medicine

## 2023-04-22 LAB — MULTIPLE MYELOMA PANEL, SERUM
Albumin SerPl Elph-Mcnc: 2.8 g/dL — ABNORMAL LOW (ref 2.9–4.4)
Albumin/Glob SerPl: 0.8 (ref 0.7–1.7)
Alpha 1: 0.4 g/dL (ref 0.0–0.4)
Alpha2 Glob SerPl Elph-Mcnc: 0.8 g/dL (ref 0.4–1.0)
B-Globulin SerPl Elph-Mcnc: 0.9 g/dL (ref 0.7–1.3)
Gamma Glob SerPl Elph-Mcnc: 1.7 g/dL (ref 0.4–1.8)
Globulin, Total: 3.7 g/dL (ref 2.2–3.9)
IgA: 88 mg/dL (ref 64–422)
IgG (Immunoglobin G), Serum: 2056 mg/dL — ABNORMAL HIGH (ref 586–1602)
IgM (Immunoglobulin M), Srm: 44 mg/dL (ref 26–217)
M Protein SerPl Elph-Mcnc: 1.6 g/dL — ABNORMAL HIGH
Total Protein ELP: 6.5 g/dL (ref 6.0–8.5)

## 2023-04-23 LAB — KAPPA/LAMBDA LIGHT CHAINS
Kappa free light chain: 1.9 mg/L — ABNORMAL LOW (ref 3.3–19.4)
Kappa, lambda light chain ratio: 0.26 (ref 0.26–1.65)
Lambda free light chains: 7.2 mg/L (ref 5.7–26.3)

## 2023-04-25 MED FILL — Dexamethasone Sodium Phosphate Inj 100 MG/10ML: INTRAMUSCULAR | Qty: 2 | Status: AC

## 2023-04-26 ENCOUNTER — Inpatient Hospital Stay: Payer: 59

## 2023-04-26 ENCOUNTER — Inpatient Hospital Stay: Payer: 59 | Admitting: Internal Medicine

## 2023-04-26 NOTE — Assessment & Plan Note (Deleted)
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  Most recently progressed on salvage therapy with daratumumab  lenalidomide [15 mg 3 w; 1-w] dexamethasone. plasma cell myeloma by approximately 70% of the overall cellular marrow;  FEB 2024-lambda light chain 284 Bone marrow-70% plasma cells. M Protein 0.9-rising lambda light chain ratio. Discontinue Revlimid; and dara-given the progressive disease.  MARCH 2024- PET scan: No skeletal lesions noted. APRIl 2024- MRI-cervical/thoracic/lumbar spine-negative for any lesions in the spinal cord. DISCONTINUEPomalyst;- carfilzomib-dexamethasone- [rising M-protein; MAY 2024- 1.3. ] start 12/27/2022-Isa-Carfil-Dex [[facility]]  #  12/27/2022-Isa-Dex; d-2carfil weekly q 28 days  [[facility]] MARCH 2024-Echo [[facility]] Left ventricular EF 50 to 55%; AUG 13th, 2024- EF-45-50%.   #  Partial response noted.  Proceed with cycle #5 day-1 today. Stable.   # Cough/ intermittent dyspnea- no fevers or chills-recommend robitussin; AUG BNP- wnl;  and HOLD off any Lasix ; continue nebs/inhaler. Stable.   # Electrolyte: Hypokalemia: mild-  continue Kdur TID; hypocalcemia- 8.0- vit D SEP 2024- 50- continie Ergo 50k weekly. REcommend ca BID  Stable.   # Iatrogenic hypothyroidism [ Graves/goiter s/p total thyroidectomy;aug,2022 ]-currently on Synthroid 150 mcg/day-[increased in AUG  ]2024-  again recommend  taking the pills fasting- stable.  sep 2024- Vit D 54. .  Stable.   # Lower back discomfort x 6 months-see above.  MRI lumbar spine as above.  Continue tramadol.  Stable.   # Bone lesions /last zometa on 11/27/2017.  S/p   Dental extraction PET scan evidence of any bone lesions.-Hold Zometa for now.  Stable.   # Infection prophylaxis: Acyclovir; asprin- add IVIG infusion [IgG < 400]- plan  IVIG- q 4 w #1 on- 9/17-  Stable. .   # IV access: mediport- functional  # Vaccination: Flu shot today [sep 2024]  s/p Pneumonia vaccination [march 2024]- stable.   6 w- given dental extrac- on 1/26. :MM panel; K/l light chain ratio q 4 W  D-isa-dex;car1&2-car; weekly x 3; week 4-OFF;  q 28 day cycle; NO zometa- dental issues-ok; but hypocalemia  # DISPOSITION: # Flu shot today # as per IS-  chemo today; and tomorrow  # as per IS-  as planned # 1 week- MD labs-cbc;bmp;  D-1 isa-dex-car D-2 car # 2 week- MD labs-:cbc;bmp;D-1 isa-dex-car D-2 car-  # 3 week- NO labs- NO MD; ONLY  IVIG Dr.B

## 2023-04-26 NOTE — Progress Notes (Deleted)
[facility] OFFICE PROGRESS NOTE  Patient Care Team: Leanna Sato, MD as PCP - General (Family Medicine) End, Cristal Deer, MD as PCP - Cardiology (Cardiology) Eddie Candle, [name] Earna Coder, MD as Consulting Physician (Hematology and Oncology) Vida Rigger, MD as Consulting Physician (Pulmonary Disease) Tedd Sias Marlana Salvage, MD as Consulting Physician (Endocrinology)   Cancer Staging  No matching staging information was found for the patient.   Oncology History Overview Note  # SEP 2018- MULTIPLE MYELOMA IgALamda [2.5 gm/dl; K/L= 16/1096]; STAGE III [beta 2 microglobulin=5.5] [presented with acute renal failure; anemia; NO hypercalcemia; Skeletal survey-Normal]; BMBx- 45% plasma cells; FISH-POSITIVE 11:14 translocation.[STANDARD-high RISK]/cyto-Normal; SEP 2018- PET- L3 posterior element lesion.   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 No morphologic support for residual myeloma disease. Negative for minimal residual disease by MM-MRD flow cytometry; HOLD REVLIMID [leg swelling]; AUG 2021-PET scan negative for myeloma; continue to hold Revlimid  # MARCH 2021-diastolic congestive heart failure [Dr.End]  # BMBx- OCT 2021- BMBx- 5% plasmacytosis-however this appears to be more polyclonal rather than monoclonal-not explain patient worsening anemia.   # OCT 2022- 18th- Dara- Rev-Dex; MARCH, 2024- PROGRESSION bone marrow biopsy  70% plasma cells; PET scan no bone lesions-however conus medullaris uptake-MRI lumbar spine-   # MAY 2024- DISCONTINUE carfilzomib-dexamethasone- Pomalyst; [rising M-protein; MAY 2024- 1.3. ]   # start 12/27/2022 -Isa-Carfil-Dex   #November 2021-hyperthyroidism/goiter- [D.Bennett/Dr.Solum]-methimazole. S/p Thyroidectomy [Dr.Kim; [facility]-AUG 2022]  --------------------------  # 12/12- RIGHT JUGULAR DVT-x 59m on xarelto; finished April 2020; September 2020-EGD/dysphagia; Dr. Tobi Bastos  # Acute renal failure [Dr.Singh; Proteinuria 1.5gm/day ]; acyclovir/Asprin ------------------------------------------------------------------------------------------------------------   DIAGNOSIS: [ ]  MULTIPLE MYELOMA  STAGE: III/HIGH RISK ;GOALS: CONTROL      Multiple myeloma not having achieved remission (HCC)  11/21/2017 - 04/28/2018 Chemotherapy   Patient is on Treatment Plan : MYELOMA SALVAGE Cyclophosphamide / Carfilzomib / Dexamethasone (CCd) q28d     05/12/2021 - 02/25/2022 Chemotherapy   Patient is on Treatment Plan : MYELOMA RELAPSED REFRACTORY Daratumumab SQ + Lenalidomide + Dexamethasone (DaraRd) q28d     05/12/2021 - 07/23/2022 Chemotherapy   Patient is on Treatment Plan : MYELOMA RELAPSED REFRACTORY Daratumumab SQ + Lenalidomide + Dexamethasone (DaraRd) q28d     11/02/2022 - 11/16/2022 Chemotherapy   Patient is on Treatment Plan : MYELOMA RELAPSED/ REFRACTORY Carfilzomib D1,8,15 (20/27) + Pomalidomide + Dexamethasone (KPd) q28d     12/27/2022 -  Chemotherapy   Patient is on Treatment Plan : MYELOMA Carfilzomib + Dexamethasone + Isatuximab q28d      INTERVAL HISTORY: Ambulating  independently.   Alone.  Brandi [name] 72 y.o.  female pleasant patient above history of relapsed multiple myeloma--currently  [name]- Carfil-Dex is here for a follow up.  Patient denies any pain. Appetite is good. Mild dyspnea - not any worse. Mild fatigue.   No fevers or chills. No infections. Denies any worsening pain.   Patient taking tramadol as needed for upper and leg cramps. Mild tingling in feet.    Review of Systems  Constitutional:  Positive for malaise/fatigue. Negative for chills, diaphoresis and fever.  HENT:  Negative for nosebleeds and sore throat.   Eyes:  Negative for double vision.  Respiratory:  Negative for hemoptysis.   Cardiovascular:  Negative for chest pain, palpitations and orthopnea.  Gastrointestinal:  Negative for abdominal pain, blood in stool, constipation, heartburn and melena.  Genitourinary:  Negative for dysuria, frequency and urgency.  Musculoskeletal:  Positive for back pain.  Skin: Negative.  Negative for itching and rash.  Neurological:  Negative for dizziness, tingling, focal weakness, weakness and headaches.  Endo/Heme/Allergies:  Does not bruise/bleed easily.  Psychiatric/Behavioral:  Negative for depression. The patient is not nervous/anxious.     PAST MEDICAL HISTORY :  Past Medical History:  Diagnosis Date  . (HFpEF) heart failure with preserved ejection fraction (HCC)    a. 06/2020 Echo: EF 60-65%, no rwma, nl RV size/fxn. Triv MR. Triv TR/AI. Mod elev PASP.  [name] Anxiety   . COPD (chronic obstructive pulmonary disease) (HCC)   . GERD (gastroesophageal reflux disease)   . Hypertension   . Hyperthyroidism   . Hypokalemia   . IgA myeloma (HCC)   . Multiple myeloma (HCC)   . Pneumonia   . Red blood cell antibody positive, compatible PRBC difficult to obtain   . S/P autologous bone marrow transplantation (HCC)     PAST SURGICAL HISTORY :   Past Surgical History:  Procedure Laterality Date  . ANTERIOR VITRECTOMY Left 10/07/2021   Procedure: ANTERIOR VITRECTOMY;  Surgeon: Lockie Mola, MD;  Location: [facility];  Service: Ophthalmology;  Laterality: Left;  . CATARACT EXTRACTION W/PHACO Left 10/07/2021   Procedure: CATARACT EXTRACTION PHACO AND INTRAOCULAR LENS PLACEMENT (IOC) LEFT VISION BLUE;  Surgeon: Lockie Mola, MD;  Location:  [facility];  Service: Ophthalmology;  Laterality: Left;  kit for manual incision 14.56 01:21.4  . CATARACT EXTRACTION W/PHACO Right 10/21/2021   Procedure: CATARACT EXTRACTION PHACO AND INTRAOCULAR LENS PLACEMENT (IOC) RIGHT 11.12 01:48.1;  Surgeon: Lockie Mola, MD;  Location: [facility];  Service: Ophthalmology;  Laterality: Right;  . ESOPHAGOGASTRODUODENOSCOPY (EGD) WITH PROPOFOL N/A 03/30/2019   Procedure: ESOPHAGOGASTRODUODENOSCOPY (EGD) WITH PROPOFOL;  Surgeon: Wyline Mood, MD;  Location: [facility];  Service: Gastroenterology;  Laterality: N/A;  . IR FLUORO GUIDE PORT INSERTION RIGHT  12/16/2017  . IR IMAGING GUIDED PORT INSERTION  06/15/2021  . KNEE ARTHROSCOPY Left 09/03/2022   Procedure: ARTHROSCOPY KNEE DEBRIDEMENT;  Surgeon: Lyndle Herrlich, MD;  Location: [facility];  Service: Orthopedics;  Laterality: Left;  . THYROIDECTOMY N/A     FAMILY HISTORY :   Family History  Problem Relation Age of Onset  . Pneumonia Mother   . Seizures Father     SOCIAL HISTORY:   Social History   Tobacco Use  . Smoking status: Former    Current packs/day: 0.00    Types: Cigarettes    Quit date: 03/18/2021    Years since quitting: 2.1  . Smokeless tobacco: Never  . Tobacco comments:    2 cigarettes QOD  Vaping Use  . Vaping status: Never Used  Substance Use Topics  . Alcohol use: No  . Drug use: No    ALLERGIES:  has No Known Allergies.  MEDICATIONS:  Current Outpatient Medications  Medication Sig Dispense Refill  . acyclovir (ZOVIRAX) 400 MG tablet Take 1 tablet by mouth twice daily 60 tablet 0  . albuterol (VENTOLIN HFA) 108 (90 Base) MCG/ACT inhaler Inhale 2 puffs into the lungs every 6 (six) hours as needed for  wheezing or shortness of breath. 8 g 3  . aspirin EC 81 MG EC tablet Take 1 tablet (81 mg total) by mouth daily. 90 tablet 3  . calcium-vitamin D (OSCAL WITH D) 500MG -200UNIT ( ) tablet Take 1 tablet by mouth 2 (two) times daily. 60 tablet 0   . carvedilol (COREG) 3.125 MG tablet Take 1 tablet (3.125 mg total) by mouth 2 (two) times daily with a meal. 60 tablet 5  . furosemide (LASIX) 40 MG tablet Take 1 tablet (40 mg total) by mouth daily. 90 tablet 3  . levothyroxine (SYNTHROID) 150 MCG tablet Take 1 tablet (150 mcg total) by mouth daily before breakfast. Take before eating or drinking anything. DO Not  eat or drink until after an hour later after taking medication. 30 tablet 3  . lidocaine-prilocaine (EMLA) cream Apply 1 application topically as needed. Apply to port and cover with saran wrap 1-2 hours prior to port access 30 g 1  . magnesium oxide (MAG-OX) 400 (240 Mg) MG tablet Take 1 tablet (400 mg total) by mouth 2 (two) times daily. 60 tablet 3  . methylPREDNISolone (MEDROL DOSEPAK) 4 MG TBPK tablet Use as directed. 21 tablet 1  . montelukast (SINGULAIR) 10 MG tablet Take 1 tablet (10 mg total) by mouth at bedtime. 30 tablet 3  . ondansetron (ZOFRAN) 8 MG tablet One pill every 8 hours as needed for nausea/vomitting. 40 tablet 1  . pantoprazole (PROTONIX) 40 MG tablet Take 1 tablet (40 mg total) by mouth daily. 90 tablet 0  . polyethylene glycol (MIRALAX / GLYCOLAX) packet Take 17 g by mouth daily as needed for mild constipation. 14 each 0  . pomalidomide (POMALYST) 4 MG capsule Take 1 capsule (4 mg total) by mouth daily. Take for 21 days, then old for 7 days. Repeat every 28 days. 21 capsule 0  . potassium chloride SA (KLOR-CON M) 20 MEQ tablet Take 1 tablet (20 mEq total) by mouth 2 (two) times daily. 60 tablet 6  . spironolactone (ALDACTONE) 25 MG tablet Take 1 tablet by mouth once daily 90 tablet 3  . traMADol (ULTRAM) 50 MG tablet Take 1 tablet (50 mg total) by mouth every 12 (twelve) hours as needed. 60 tablet 0  . Vitamin D, Ergocalciferol, (DRISDOL) 1.25 MG (50000 UNIT) CAPS capsule Take 1 capsule (50,000 Units total) by mouth once a week. 12 capsule 0   No current facility-administered medications for this visit.    Facility-Administered Medications Ordered in Other Visits  Medication Dose Route Frequency Provider Last Rate Last Admin  . 0.9 %  sodium chloride infusion   Intravenous Continuous Ralene Muskrat, PA-C        PHYSICAL EXAMINATION:   There were no vitals taken for this visit.  There were no vitals filed for this visit.    Mild leg swelling.   Physical Exam HENT:     Head: Normocephalic and atraumatic.  Eyes:     Pupils: Pupils are equal, round, and reactive to light.  Cardiovascular:     Rate and Rhythm: Normal rate and regular rhythm.  Pulmonary:     Effort: Pulmonary effort is normal. No respiratory distress.     Breath sounds: Normal breath sounds. No wheezing.  Abdominal:     General: Bowel sounds are normal. There is no distension.     Palpations: Abdomen is soft. There is no mass.     Tenderness: There is no abdominal tenderness. There is no guarding or rebound.  Musculoskeletal:  General: No tenderness. Normal range of motion.     Cervical back: Normal range of motion and neck supple.  Skin:    General: Skin is warm.  Neurological:     Mental Status: She is alert and oriented to person, place, and time.  Psychiatric:        Mood and Affect: Affect normal.   LABORATORY DATA:  I have reviewed the data as listed    Component Value Date/Time   NA 139 04/19/2023 0917   NA 141 01/25/2020 1530   K 3.6 04/19/2023 0917   CL 110 04/19/2023 0917   CO2 22 04/19/2023 0917   GLUCOSE 96 04/19/2023 0917   BUN 15 04/19/2023 0917   BUN 19 01/25/2020 1530   CREATININE 0.89 04/19/2023 0917   CALCIUM 8.5 (L) 04/19/2023 0917   PROT 7.2 04/19/2023 0917   ALBUMIN 4.1 04/19/2023 0917   AST 16 04/19/2023 0917   ALT 11 04/19/2023 0917   ALKPHOS 67 04/19/2023 0917   BILITOT 0.6 04/19/2023 0917   GFRNONAA >60 04/19/2023 0917   GFRAA NOT CALCULATED 04/17/2020 1352    No results found for: "SPEP", "UPEP"  Lab Results  Component Value Date   WBC 2.9 (L) 04/19/2023    NEUTROABS 1.2 (L) 04/19/2023   HGB 9.9 (L) 04/19/2023   HCT 32.1 (L) 04/19/2023   MCV 96.1 04/19/2023   PLT 246 04/19/2023      Chemistry      Component Value Date/Time   NA 139 04/19/2023 0917   NA 141 01/25/2020 1530   K 3.6 04/19/2023 0917   CL 110 04/19/2023 0917   CO2 22 04/19/2023 0917   BUN 15 04/19/2023 0917   BUN 19 01/25/2020 1530   CREATININE 0.89 04/19/2023 0917      Component Value Date/Time   CALCIUM 8.5 (L) 04/19/2023 0917   ALKPHOS 67 04/19/2023 0917   AST 16 04/19/2023 0917   ALT 11 04/19/2023 0917   BILITOT 0.6 04/19/2023 0917      (H): Data is abnormally high   Latest Reference Range & Units Most Recent 12/24/20 14:01 04/01/21 14:34 04/28/21 09:17 06/11/21 09:42 07/03/21 10:32 08/20/21 10:06  M Protein SerPl Elph-Mcnc Not Observed g/dL 1.1 (H) (C) 04/08/77 29:56 Not Observed (C) 0.6 (H) (C) 0.7 (H) (C) 0.5 (H) (C) 0.7 (H) (C) 1.1 (H) (C)    Latest Reference Range & Units Most Recent 09/17/21 08:47 10/15/21 08:25 11/12/21 08:55  Kappa free light chain 3.3 - 19.4 mg/L 5.0 11/12/21 08:55 7.2 7.2 5.0  Lambda free light chains 5.7 - 26.3 mg/L 223.6 (H) 11/12/21 08:55 215.3 (H) 340.2 (H) 223.6 (H)  Kappa, lambda light chain ratio 0.26 - 1.65  0.02 (L) 11/12/21 08:55 0.03 (L) 0.02 (L) 0.02 (L)  (H): Data is abnormally high (L): Data is abnormally low  RADIOGRAPHIC STUDIES: I have personally reviewed the radiological images as listed and agreed with the findings in the report. No results found.   ASSESSMENT & PLAN:  No problem-specific Assessment & Plan notes found for this encounter.   No orders of the defined types were placed in this encounter.  All questions were answered. The patient knows to call the clinic with any problems, questions or concerns.      Earna Coder, MD 04/26/2023 9:39 AM

## 2023-04-27 ENCOUNTER — Other Ambulatory Visit: Payer: Self-pay | Admitting: Internal Medicine

## 2023-04-27 ENCOUNTER — Inpatient Hospital Stay: Payer: 59

## 2023-04-27 DIAGNOSIS — C9000 Multiple myeloma not having achieved remission: Secondary | ICD-10-CM

## 2023-04-28 ENCOUNTER — Encounter: Payer: Self-pay | Admitting: Internal Medicine

## 2023-04-29 ENCOUNTER — Other Ambulatory Visit: Payer: Self-pay

## 2023-05-02 MED FILL — Dexamethasone Sodium Phosphate Inj 100 MG/10ML: INTRAMUSCULAR | Qty: 2 | Status: AC

## 2023-05-03 ENCOUNTER — Inpatient Hospital Stay (HOSPITAL_BASED_OUTPATIENT_CLINIC_OR_DEPARTMENT_OTHER): Payer: 59 | Admitting: Internal Medicine

## 2023-05-03 ENCOUNTER — Encounter: Payer: Self-pay | Admitting: Internal Medicine

## 2023-05-03 ENCOUNTER — Inpatient Hospital Stay: Payer: 59 | Attending: Nurse Practitioner

## 2023-05-03 ENCOUNTER — Ambulatory Visit: Payer: 59

## 2023-05-03 ENCOUNTER — Inpatient Hospital Stay: Payer: 59

## 2023-05-03 ENCOUNTER — Other Ambulatory Visit: Payer: 59

## 2023-05-03 ENCOUNTER — Ambulatory Visit: Payer: 59 | Admitting: Internal Medicine

## 2023-05-03 VITALS — BP 141/71 | HR 63 | Temp 98.2°F | Resp 19

## 2023-05-03 DIAGNOSIS — C9000 Multiple myeloma not having achieved remission: Secondary | ICD-10-CM

## 2023-05-03 DIAGNOSIS — Z79620 Long term (current) use of immunosuppressive biologic: Secondary | ICD-10-CM | POA: Diagnosis not present

## 2023-05-03 DIAGNOSIS — Z5112 Encounter for antineoplastic immunotherapy: Secondary | ICD-10-CM | POA: Diagnosis present

## 2023-05-03 DIAGNOSIS — Z87891 Personal history of nicotine dependence: Secondary | ICD-10-CM | POA: Diagnosis not present

## 2023-05-03 DIAGNOSIS — E032 Hypothyroidism due to medicaments and other exogenous substances: Secondary | ICD-10-CM | POA: Insufficient documentation

## 2023-05-03 DIAGNOSIS — C9002 Multiple myeloma in relapse: Secondary | ICD-10-CM | POA: Insufficient documentation

## 2023-05-03 DIAGNOSIS — Z9484 Stem cells transplant status: Secondary | ICD-10-CM | POA: Insufficient documentation

## 2023-05-03 LAB — CMP (CANCER CENTER ONLY)
ALT: 9 U/L (ref 0–44)
AST: 14 U/L — ABNORMAL LOW (ref 15–41)
Albumin: 3.9 g/dL (ref 3.5–5.0)
Alkaline Phosphatase: 67 U/L (ref 38–126)
Anion gap: 7 (ref 5–15)
BUN: 13 mg/dL (ref 8–23)
CO2: 21 mmol/L — ABNORMAL LOW (ref 22–32)
Calcium: 8.1 mg/dL — ABNORMAL LOW (ref 8.9–10.3)
Chloride: 109 mmol/L (ref 98–111)
Creatinine: 0.84 mg/dL (ref 0.44–1.00)
GFR, Estimated: >60 mL/min (ref >60)
Glucose, Bld: 94 mg/dL (ref 70–99)
Potassium: 3.7 mmol/L (ref 3.5–5.1)
Sodium: 137 mmol/L (ref 135–145)
Total Bilirubin: 0.6 mg/dL (ref 0.3–1.2)
Total Protein: 6.8 g/dL (ref 6.5–8.1)

## 2023-05-03 LAB — CBC WITH DIFFERENTIAL (CANCER CENTER ONLY)
Abs Immature Granulocytes: 0.03 10*3/uL (ref 0.00–0.07)
Basophils Absolute: 0 10*3/uL (ref 0.0–0.1)
Basophils Relative: 1 %
Eosinophils Absolute: 0.1 10*3/uL (ref 0.0–0.5)
Eosinophils Relative: 2 %
HCT: 31 % — ABNORMAL LOW (ref 36.0–46.0)
Hemoglobin: 9.6 g/dL — ABNORMAL LOW (ref 12.0–15.0)
Immature Granulocytes: 1 %
Lymphocytes Relative: 41 %
Lymphs Abs: 1.5 10*3/uL (ref 0.7–4.0)
MCH: 29.6 pg (ref 26.0–34.0)
MCHC: 31 g/dL (ref 30.0–36.0)
MCV: 95.7 fL (ref 80.0–100.0)
Monocytes Absolute: 0.4 10*3/uL (ref 0.1–1.0)
Monocytes Relative: 10 %
Neutro Abs: 1.7 10*3/uL (ref 1.7–7.7)
Neutrophils Relative %: 45 %
Platelet Count: 240 10*3/uL (ref 150–400)
RBC: 3.24 MIL/uL — ABNORMAL LOW (ref 3.87–5.11)
RDW: 15.4 % (ref 11.5–15.5)
WBC Count: 3.7 10*3/uL — ABNORMAL LOW (ref 4.0–10.5)
nRBC: 0 % (ref 0.0–0.2)

## 2023-05-03 MED ORDER — SODIUM CHLORIDE 0.9 % IV SOLN
Freq: Once | INTRAVENOUS | Status: AC
  Filled 2023-05-03: qty 250

## 2023-05-03 MED ORDER — MAGNESIUM OXIDE -MG SUPPLEMENT 400 (240 MG) MG PO TABS: 400.0000 mg | ORAL_TABLET | Freq: Two times a day (BID) | ORAL | 3 refills | Status: DC

## 2023-05-03 MED ORDER — DEXTROSE 5 % IV SOLN
56.0000 mg/m2 | Freq: Once | INTRAVENOUS | Status: AC
  Administered 2023-05-03: 100 mg via INTRAVENOUS
  Filled 2023-05-03: qty 30

## 2023-05-03 MED ORDER — SODIUM CHLORIDE 0.9 % IV SOLN
20.0000 mg | Freq: Once | INTRAVENOUS | Status: AC
  Administered 2023-05-03: 20 mg via INTRAVENOUS
  Filled 2023-05-03: qty 2
  Filled 2023-05-03: qty 20

## 2023-05-03 MED ORDER — HEPARIN SOD (PORK) LOCK FLUSH 100 UNIT/ML IV SOLN
500.0000 [IU] | Freq: Once | INTRAVENOUS | Status: AC | PRN
  Administered 2023-05-03: 500 [IU]
  Filled 2023-05-03: qty 5

## 2023-05-03 MED FILL — Dexamethasone Sodium Phosphate Inj 100 MG/10ML: INTRAMUSCULAR | Qty: 1 | Status: AC

## 2023-05-03 NOTE — Patient Instructions (Signed)
[facility]  Discharge Instructions: Thank you for choosing [facility] to provide your oncology and hematology care.  If you have a lab appointment with the Cancer Center, please go directly to the Cancer Center and check in at the registration area.  Wear comfortable clothing and clothing appropriate for easy access to any Portacath or PICC line.   We strive to give you quality time with your provider. You may need to reschedule your appointment if you arrive late (15 or more minutes).  Arriving late affects you and other patients whose appointments are after yours.  Also, if you miss three or more appointments without notifying the office, you may be dismissed from the clinic at the provider's discretion.      For prescription refill requests, have your pharmacy contact our office and allow 72 hours for refills to be completed.    Today you received the following chemotherapy and/or immunotherapy agents- kyprolis      To help prevent nausea and vomiting after your treatment, we encourage you to take your nausea medication as directed.  BELOW ARE SYMPTOMS THAT SHOULD BE REPORTED IMMEDIATELY: *FEVER GREATER THAN 100.4 F (38 C) OR HIGHER *CHILLS OR SWEATING *NAUSEA AND VOMITING THAT IS NOT CONTROLLED WITH YOUR NAUSEA MEDICATION *UNUSUAL SHORTNESS OF BREATH *UNUSUAL BRUISING OR BLEEDING *URINARY PROBLEMS (pain or burning when urinating, or frequent urination) *BOWEL PROBLEMS (unusual diarrhea, constipation, pain near the anus) TENDERNESS IN MOUTH AND THROAT WITH OR WITHOUT PRESENCE OF ULCERS (sore throat, sores in mouth, or a toothache) UNUSUAL RASH, SWELLING OR PAIN  UNUSUAL VAGINAL DISCHARGE OR ITCHING   Items with * indicate a potential emergency and should be followed up as soon as possible or go to the Emergency Department if any problems should occur.  Please show the CHEMOTHERAPY ALERT CARD or IMMUNOTHERAPY ALERT CARD at check-in to  the Emergency Department and triage nurse.  Should you have questions after your visit or need to cancel or reschedule your appointment, please contact [facility]  [phone] and follow the prompts.  Office hours are 8:00 a.m. to 4:30 p.m. Monday - Friday. Please note that voicemails left after 4:00 p.m. may not be returned until the following business day.  We are closed weekends and major holidays. You have access to a nurse at all times for urgent questions. Please call the main number to the clinic [phone] and follow the prompts.  For any non-urgent questions, you may also contact your provider using MyChart. We now offer e-Visits for anyone 18 and older to request care online for non-urgent symptoms. For details visit [email or web address].   Also download the MyChart app! Go to the app store, search "MyChart", open the app, select [facility], and log in with your MyChart username and password.    

## 2023-05-03 NOTE — Progress Notes (Signed)
Dx with glaucoma b/l.

## 2023-05-03 NOTE — Progress Notes (Signed)
[facility] OFFICE PROGRESS NOTE  Patient Care Team: Brandi Sato, MD as PCP - General (Family Medicine) End, Brandi Deer, MD as PCP - Cardiology (Cardiology) Brandi Candle, [name] Brandi Coder, MD as Consulting Physician (Hematology and Oncology) Brandi Rigger, MD as Consulting Physician (Pulmonary Disease) Brandi Sias Marlana Salvage, MD as Consulting Physician (Endocrinology)   Cancer Staging  No matching staging information was found for the patient.   Oncology History Overview Note  # SEP 2018- MULTIPLE MYELOMA IgALamda [2.5 gm/dl; K/L= 16/1096]; STAGE III [beta 2 microglobulin=5.5] [presented with acute renal failure; anemia; NO hypercalcemia; Skeletal survey-Normal]; BMBx- 45% plasma cells; FISH-POSITIVE 11:14 translocation.[STANDARD-high RISK]/cyto-Normal; SEP 2018- PET- L3 posterior element lesion.   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 No morphologic support for residual myeloma disease. Negative for minimal residual disease by MM-MRD flow cytometry; HOLD REVLIMID [leg swelling]; AUG 2021-PET scan negative for myeloma; continue to hold Revlimid  # MARCH 2021-diastolic congestive heart failure [Brandi Brandi [name]]  # BMBx- OCT 2021- BMBx- 5% plasmacytosis-however this appears to be more polyclonal rather than monoclonal-not explain patient worsening anemia.   # OCT 2022- 18th- Dara- Rev-Dex; MARCH, 2024- PROGRESSION bone marrow biopsy  70% plasma cells; PET scan no bone lesions-however conus medullaris uptake-MRI lumbar spine-   # MAY 2024- DISCONTINUE carfilzomib-dexamethasone- Pomalyst; [rising M-protein; MAY 2024- 1.3. ]   # start 12/27/2022 -Isa-Carfil-Dex   #November 2021-hyperthyroidism/goiter- [Brandi Brandi [name]/Brandi Brandi [name]]-methimazole. S/p Thyroidectomy [Brandi Brandi [name]; Brandi Brandi [name] 2022]  --------------------------  # 12/12- RIGHT JUGULAR DVT-x 56m on xarelto; finished April 2020; September 2020-EGD/dysphagia; Dr. Tobi Brandi [name]  # Acute renal failure [Brandi Brandi [name]; Proteinuria 1.5gm/day ]; acyclovir/Asprin ------------------------------------------------------------------------------------------------------------   DIAGNOSIS: [ ]  MULTIPLE MYELOMA  STAGE: III/HIGH RISK ;GOALS: CONTROL      Multiple myeloma not having achieved remission (HCC)  11/21/2017 - 04/28/2018 Chemotherapy   Patient is on Treatment Plan : MYELOMA Brandi [name] Cyclophosphamide / Carfilzomib / Dexamethasone (CCd) q28d     05/12/2021 - 02/25/2022 Chemotherapy   Patient is on Treatment Plan : MYELOMA RELAPSED REFRACTORY Daratumumab SQ + Lenalidomide + Dexamethasone (DaraRd) q28d     05/12/2021 - 07/23/2022 Chemotherapy   Patient is on Treatment Plan : MYELOMA RELAPSED REFRACTORY Daratumumab SQ + Lenalidomide + Dexamethasone (DaraRd) q28d     11/02/2022 - 11/16/2022 Chemotherapy   Patient is on Treatment Plan : MYELOMA RELAPSED/ REFRACTORY Carfilzomib D1,8,15 (20/27) + Pomalidomide + Dexamethasone (KPd) q28d     12/27/2022 -  Chemotherapy   Patient is on Treatment Plan : MYELOMA Carfilzomib + Dexamethasone + Isatuximab q28d      INTERVAL HISTORY: Ambulating  independently.   Alone.  [name] 72 y.o.  female pleasant patient above history of relapsed multiple myeloma--currently  Brandi Brandi [name]- Carfil-Dex is here for a follow up.  Patient noted to have Glaucoma BIL- L > R [Brandi Brandi [name]; Brandi Brandi [name]] on [name] drops.   Patient denies any pain. Appetite is good. Mild dyspnea - not any worse.  Mild fatigue.   No fevers or chills. No infections. Denies any worsening pain.  Patient taking tramadol as needed for  upper and leg cramps. Mild tingling in feet.    Review of Systems  Constitutional:  Positive for malaise/fatigue. Negative for chills, diaphoresis and fever.  HENT:  Negative for nosebleeds and sore throat.   Eyes:  Negative for double vision.  Respiratory:  Negative for hemoptysis.   Cardiovascular:  Negative for chest pain, palpitations and orthopnea.  Gastrointestinal:  Negative for abdominal pain, blood in stool, constipation, heartburn and melena.  Genitourinary:  Negative for dysuria, frequency and urgency.  Musculoskeletal:  Positive for back pain.  Skin: Negative.  Negative for itching and rash.  Neurological:  Negative for dizziness, tingling, focal weakness, weakness and headaches.  Endo/Heme/Allergies:  Does not bruise/bleed easily.  Psychiatric/Behavioral:  Negative for depression. The patient is not nervous/anxious.     PAST MEDICAL HISTORY :  Past Medical History:  Diagnosis Date   (HFpEF) heart failure with preserved ejection Brandi [name] (HCC)    a. 06/2020 Echo: EF 60-65%, no rwma, nl RV size/fxn. Triv MR. Triv TR/AI. Mod elev PASP.   Anxiety    COPD (chronic obstructive pulmonary disease) (HCC)    GERD (gastroesophageal reflux disease)    Hypertension    Hyperthyroidism    Hypokalemia    IgA myeloma (HCC)    Multiple myeloma (HCC)    Pneumonia    Red blood cell antibody positive, compatible PRBC difficult to obtain    S/P autologous bone marrow transplantation (HCC)     PAST SURGICAL HISTORY :   Past Surgical History:  Procedure Laterality Date   ANTERIOR VITRECTOMY Left 10/07/2021   Procedure: ANTERIOR VITRECTOMY;  Surgeon: Brandi Mola, MD;  [name];  Service: Ophthalmology;  Laterality: Left;   CATARACT EXTRACTION W/PHACO Left 10/07/2021   Procedure: CATARACT EXTRACTION PHACO AND INTRAOCULAR LENS PLACEMENT (IOC)  LEFT VISION BLUE;  Surgeon: Brandi Mola, MD;  Location: [facility];  Service: Ophthalmology;  Laterality: Left;  kit for manual incision 14.56 01:21.4   CATARACT EXTRACTION W/PHACO Right 10/21/2021   Procedure: CATARACT EXTRACTION PHACO AND INTRAOCULAR LENS PLACEMENT (IOC) RIGHT 11.12 01:48.1;  Surgeon: Brandi Mola, MD;  Location: [name];  Service: Ophthalmology;  Laterality: Right;   ESOPHAGOGASTRODUODENOSCOPY (EGD) WITH PROPOFOL N/A 03/30/2019   Procedure: ESOPHAGOGASTRODUODENOSCOPY (EGD) WITH PROPOFOL;  Surgeon: Wyline Mood, MD;  Location: [name];  Service: Gastroenterology;  Laterality: N/A;   IR FLUORO GUIDE PORT INSERTION RIGHT  12/16/2017   IR IMAGING GUIDED PORT INSERTION  06/15/2021   KNEE ARTHROSCOPY Left 09/03/2022   Procedure: ARTHROSCOPY KNEE DEBRIDEMENT;  Surgeon: Lyndle Herrlich, MD;  Location: [facility];  Service: Orthopedics;  Laterality: Left;   THYROIDECTOMY N/A     FAMILY HISTORY :   Family History  Problem Relation Age of Onset   Pneumonia Mother    Seizures Father     SOCIAL HISTORY:   Social History   Tobacco Use   Smoking status: Former    Current packs/day: 0.00    Types: Cigarettes    Quit date: 03/18/2021    Years since quitting: 2.1   Smokeless tobacco: Never   Tobacco comments:    2 cigarettes QOD  Vaping Use   Vaping status: Never Used  Substance Use Topics   Alcohol use: No   Drug use: No    ALLERGIES:  has No Known Allergies.  MEDICATIONS:  Current Outpatient Medications  Medication Sig Dispense Refill   acyclovir (ZOVIRAX) 400 MG tablet Take 1 tablet by mouth twice daily 60 tablet 0   albuterol (VENTOLIN HFA) 108 (  90 Base) MCG/ACT inhaler Inhale 2 puffs into the lungs every 6 (six) hours as needed for wheezing or shortness of breath. 8 g 3   aspirin EC 81 MG EC tablet Take 1 tablet (81 mg total) by mouth daily. 90 tablet 3   calcium-vitamin D (OSCAL WITH D) 500MG -200UNIT ( ) tablet Take 1  tablet by mouth 2 (two) times daily. 60 tablet 0   carvedilol (COREG) 3.125 MG tablet Take 1 tablet (3.125 mg total) by mouth 2 (two) times daily with a meal. 60 tablet 5   furosemide (LASIX) 40 MG tablet Take 1 tablet (40 mg total) by mouth daily. 90 tablet 3   levothyroxine (SYNTHROID) 150 MCG tablet Take 1 tablet (150 mcg total) by mouth daily before breakfast. Take before eating or drinking anything. DO Not  eat or drink until after an hour later after taking medication. 30 tablet 3   lidocaine-prilocaine (EMLA) cream Apply 1 application topically as needed. Apply to port and cover with saran wrap 1-2 hours prior to port access 30 g 1   montelukast (SINGULAIR) 10 MG tablet Take 1 tablet (10 mg total) by mouth at bedtime. 30 tablet 3   ondansetron (ZOFRAN) 8 MG tablet One pill every 8 hours as needed for nausea/vomitting. 40 tablet 1   pantoprazole (PROTONIX) 40 MG tablet Take 1 tablet (40 mg total) by mouth daily. 90 tablet 0   polyethylene glycol (MIRALAX / GLYCOLAX) packet Take 17 g by mouth daily as needed for mild constipation. 14 each 0   pomalidomide (POMALYST) 4 MG capsule Take 1 capsule (4 mg total) by mouth daily. Take for 21 days, then old for 7 days. Repeat every 28 days. 21 capsule 0   potassium chloride SA (KLOR-CON M) 20 MEQ tablet Take 1 tablet (20 mEq total) by mouth 2 (two) times daily. 60 tablet 6   spironolactone (ALDACTONE) 25 MG tablet Take 1 tablet by mouth once daily 90 tablet 3   traMADol (ULTRAM) 50 MG tablet Take 1 tablet (50 mg total) by mouth every 12 (twelve) hours as needed. 60 tablet 0   Vitamin D, Ergocalciferol, (DRISDOL) 1.25 MG (50000 UNIT) CAPS capsule Take 1 capsule (50,000 Units total) by mouth once a week. 12 capsule 0   magnesium oxide (MAG-OX) 400 (240 Mg) MG tablet Take 1 tablet (400 mg total) by mouth 2 (two) times daily. 60 tablet 3   No current facility-administered medications for this visit.   Facility-Administered Medications Ordered in Other  Visits  Medication Dose Route Frequency Provider Last Rate Last Admin   0.9 %  sodium chloride infusion   Intravenous Continuous Ralene Muskrat, PA-C       carfilzomib (KYPROLIS) 100 mg in dextrose 5 % 100 mL chemo infusion  56 mg/m2 (Treatment Plan Recorded) Intravenous Once Brandi Coder, MD 300 mL/hr at 05/03/23 1250 100 mg at 05/03/23 1250    PHYSICAL EXAMINATION:   BP (!) 143/79 (BP Location: Left Arm, Patient Position: Sitting, Cuff Size: Normal)   Pulse 69   Temp 98.2 F (36.8 C) (Tympanic)   Wt 173 lb (78.5 kg)   SpO2 98%   BMI 31.64 kg/m   Filed Weights   05/03/23 1049  Weight: 173 lb (78.5 kg)      Mild leg swelling.   Physical Exam HENT:     Head: Normocephalic and atraumatic.  Eyes:     Pupils: Pupils are equal, round, and reactive to light.  Cardiovascular:     Rate and Rhythm:  Normal rate and regular rhythm.  Pulmonary:     Effort: Pulmonary effort is normal. No respiratory distress.     Breath sounds: Normal breath sounds. No wheezing.  Abdominal:     General: Bowel sounds are normal. There is no distension.     Palpations: Abdomen is soft. There is no mass.     Tenderness: There is no abdominal tenderness. There is no guarding or rebound.  Musculoskeletal:        General: No tenderness. Normal range of motion.     Cervical back: Normal range of motion and neck supple.  Skin:    General: Skin is warm.  Neurological:     Mental Status: She is alert and oriented to person, place, and time.  Psychiatric:        Mood and Affect: Affect normal.    LABORATORY DATA:  I have reviewed the data as listed    Component Value Date/Time   NA 137 05/03/2023 1022   NA 141 01/25/2020 1530   K 3.7 05/03/2023 1022   CL 109 05/03/2023 1022   CO2 21 (L) 05/03/2023 1022   GLUCOSE 94 05/03/2023 1022   BUN 13 05/03/2023 1022   BUN 19 01/25/2020 1530   CREATININE 0.84 05/03/2023 1022   CALCIUM 8.1 (L) 05/03/2023 1022   PROT 6.8 05/03/2023 1022    ALBUMIN 3.9 05/03/2023 1022   AST 14 (L) 05/03/2023 1022   ALT 9 05/03/2023 1022   ALKPHOS 67 05/03/2023 1022   BILITOT 0.6 05/03/2023 1022   GFRNONAA >60 05/03/2023 1022   GFRAA NOT CALCULATED 04/17/2020 1352    No results found for: "SPEP", "UPEP"  Lab Results  Component Value Date   WBC 3.7 (L) 05/03/2023   NEUTROABS 1.7 05/03/2023   HGB 9.6 (L) 05/03/2023   HCT 31.0 (L) 05/03/2023   MCV 95.7 05/03/2023   PLT 240 05/03/2023      Chemistry      Component Value Date/Time   NA 137 05/03/2023 1022   NA 141 01/25/2020 1530   K 3.7 05/03/2023 1022   CL 109 05/03/2023 1022   CO2 21 (L) 05/03/2023 1022   BUN 13 05/03/2023 1022   BUN 19 01/25/2020 1530   CREATININE 0.84 05/03/2023 1022      Component Value Date/Time   CALCIUM 8.1 (L) 05/03/2023 1022   ALKPHOS 67 05/03/2023 1022   AST 14 (L) 05/03/2023 1022   ALT 9 05/03/2023 1022   BILITOT 0.6 05/03/2023 1022      (H): Data is abnormally high   Latest Reference Range & Units Most Recent 12/24/20 14:01 04/01/21 14:34 04/28/21 09:17 06/11/21 09:42 07/03/21 10:32 08/20/21 10:06  M Protein SerPl Elph-Mcnc Not Observed g/dL 1.1 (H) (C) 10/01/63 78:46 Not Observed (C) 0.6 (H) (C) 0.7 (H) (C) 0.5 (H) (C) 0.7 (H) (C) 1.1 (H) (C)    Latest Reference Range & Units Most Recent 09/17/21 08:47 10/15/21 08:25 11/12/21 08:55  Kappa free light chain 3.3 - 19.4 mg/L 5.0 11/12/21 08:55 7.2 7.2 5.0  Lambda free light chains 5.7 - 26.3 mg/L 223.6 (H) 11/12/21 08:55 215.3 (H) 340.2 (H) 223.6 (H)  Kappa, lambda light chain ratio 0.26 - 1.65  0.02 (L) 11/12/21 08:55 0.03 (L) 0.02 (L) 0.02 (L)  (H): Data is abnormally high (L): Data is abnormally low  RADIOGRAPHIC STUDIES: I have personally reviewed the radiological images as listed and agreed with the findings in the report. No results found.   ASSESSMENT & PLAN:  Multiple  myeloma not having achieved remission (HCC) # RECURRENT Multiple myeloma stage III [high-risk cytogenetics-status  post KRD-VGPR ; status post autologous stem cell transplant on 06/15/18.  Most recently progressed on Brandi [name] therapy with daratumumab  lenalidomide [15 mg 3 w; 1-w] dexamethasone. plasma cell myeloma by approximately 70% of the overall cellular marrow;  FEB 2024-lambda light chain 284 Bone marrow-70% plasma cells. M Protein 0.9-rising lambda light chain ratio. Discontinue Revlimid; and dara-given the progressive disease.  MARCH 2024- PET scan: No skeletal lesions noted. APRIl 2024- MRI-cervical/thoracic/lumbar spine-negative for any lesions in the spinal cord. DISCONTINUEPomalyst;- carfilzomib-dexamethasone- [rising M-protein; MAY 2024- 1.3. ]start 12/27/2022-Isa-Carfil-Dex [Brandi [name]]  #  12/27/2022-Isa-Dex; d-2carfil weekly q 28 days  [Brandi [name]] MARCH 2024-Echo [[facility]] Left ventricular EF 50 to 55%; AUG 13th, 2024- EF-45-50%. Monitor for now.   # Partial response noted.  Proceed with cycle #5 day-8 today [decrease dexamethasone 20 weekly- starting 05/04/23- sec to glaucoma]. Reviewed that M protein elevated; but suspect spurious- will re-check-myeloma panel kappa lambda light chain today.  # Electrolyte: Hypokalemia: mild-  continue Kdur TID; hypocalcemia- 8.0- vit D SEP 2024- 50- continie Ergo 50k weekly. REcommend ca BID  Stable.   # Iatrogenic hypothyroidism [ Graves/goiter s/p total thyroidectomy;aug,2022 ]-currently on Synthroid 150 mcg/day-[increased in AUG, 2024-  again recommend  taking the pills fasting- stable.  sep 2024- Vit D 54. stable  # Glaucoma BIL- L > R [Brandi Brandi [name]; [place] [name]] on [name] drops. Will decrease the steroids.  Will discuss with ophthalmology.  # Lower back discomfort x 6 months-see above.  MRI lumbar spine as above.  Continue tramadol. stable  # Bone lesions /last zometa on 11/27/2017.  S/p   Dental extraction PET scan evidence of any bone lesions.-Hold Zometa for now.stable  # Infection prophylaxis: Acyclovir; asprin- add IVIG infusion [IgG < 400]- plan  IVIG- q 4 w #1 on- 9/17-  stable  # IV access: mediport- functional  # Vaccination: Flu shot today [sep 2024] s/p Pneumonia vaccination [march 2024]- stable.   6 w- given dental extrac- on 1/26. :MM panel; K/l light chain ratio q 4 W  D-isa-dex;car1&2-car; weekly x 3; week 4-OFF;  q 28 day cycle; NO zometa- dental issues-ok; but hypocalemia  # DISPOSITION: # ask chemo RN to draw- today- MM panel; K/l lamda light chains- # as per IS-  chemo today; and tomorrow  # as per IS-  as planned # 1 week- MD labs-cbc;bmp;  D-1 isa-dex-car D-2 car-Dr.B   Orders Placed This Encounter  Procedures   Kappa/lambda light chains    Standing Status:   Future    Number of Occurrences:   1    Standing Expiration Date:   05/02/2024   Multiple Myeloma Panel (SPEP&IFE w/QIG)    Standing Status:   Future    Number of Occurrences:   1    Standing Expiration Date:   05/02/2024   All questions were answered. The patient knows to call the clinic with any problems, questions or concerns.      Brandi Coder, MD 05/03/2023 12:51 PM End

## 2023-05-03 NOTE — Assessment & Plan Note (Addendum)
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  Most recently progressed on salvage therapy with daratumumab  lenalidomide [15 mg 3 w; 1-w] dexamethasone. plasma cell myeloma by approximately 70% of the overall cellular marrow;  FEB 2024-lambda light chain 284 Bone marrow-70% plasma cells. M Protein 0.9-rising lambda light chain ratio. Discontinue Revlimid; and dara-given the progressive disease.  MARCH 2024- PET scan: No skeletal lesions noted. APRIl 2024- MRI-cervical/thoracic/lumbar spine-negative for any lesions in the spinal cord. DISCONTINUEPomalyst;- carfilzomib-dexamethasone- [rising M-protein; MAY 2024- 1.3. ]start 12/27/2022-Isa-Carfil-Dex [[facility]]  #  12/27/2022-Isa-Dex; d-2carfil weekly q 28 days  [[facility]] MARCH 2024-Echo [[facility]] Left ventricular EF 50 to 55%; AUG 13th, 2024- EF-45-50%. Monitor for now.   # Partial response noted.  Proceed with cycle #5 day-8 today [decrease dexamethasone 20 weekly- starting 05/04/23- sec to glaucoma]. Reviewed that M protein elevated; but suspect spurious- will re-check-myeloma panel kappa lambda light chain today.  # Electrolyte: Hypokalemia: mild-  continue Kdur TID; hypocalcemia- 8.0- vit D SEP 2024- 50- continie Ergo 50k weekly. REcommend ca BID  Stable.   # Iatrogenic hypothyroidism [ Graves/goiter s/p total thyroidectomy;aug,2022 ]-currently on Synthroid 150 mcg/day-[increased in AUG, 2024-  again recommend  taking the pills fasting- stable.  sep 2024- Vit D 54. stable  # Glaucoma BIL- L > R [Dr.King; [facility]] on eye drops. Will decrease the steroids.  Will discuss with ophthalmology.  # Lower back discomfort x 6 months-see above.  MRI lumbar spine as above.  Continue tramadol. stable  # Bone lesions /last zometa on 11/27/2017.  S/p   Dental extraction PET scan evidence of any bone lesions.-Hold Zometa for now.stable  # Infection prophylaxis: Acyclovir;  asprin- add IVIG infusion [IgG < 400]- plan  IVIG- q 4 w #1 on- 9/17- stable  # IV access: mediport- functional  # Vaccination: Flu shot today [sep 2024] s/p Pneumonia vaccination [march 2024]- stable.   6 w- given dental extrac- on 1/26. :MM panel; K/l light chain ratio q 4 W  D-isa-dex;car1&2-car; weekly x 3; week 4-OFF;  q 28 day cycle; NO zometa- dental issues-ok; but hypocalemia  # DISPOSITION: # ask chemo RN to draw- today- MM panel; K/l lamda light chains- # as per IS-  chemo today; and tomorrow  # as per IS-  as planned # 1 week- MD labs-cbc;bmp;  D-1 isa-dex-car D-2 car-Dr.B

## 2023-05-04 ENCOUNTER — Inpatient Hospital Stay: Payer: 59

## 2023-05-04 ENCOUNTER — Ambulatory Visit: Payer: 59

## 2023-05-04 VITALS — BP 136/75 | HR 57 | Temp 98.3°F | Resp 17

## 2023-05-04 DIAGNOSIS — C9000 Multiple myeloma not having achieved remission: Secondary | ICD-10-CM

## 2023-05-04 DIAGNOSIS — Z5112 Encounter for antineoplastic immunotherapy: Secondary | ICD-10-CM | POA: Diagnosis not present

## 2023-05-04 LAB — KAPPA/LAMBDA LIGHT CHAINS
Kappa free light chain: 3.5 mg/L (ref 3.3–19.4)
Kappa, lambda light chain ratio: 0.52 (ref 0.26–1.65)
Lambda free light chains: 6.7 mg/L (ref 5.7–26.3)

## 2023-05-04 MED ORDER — SODIUM CHLORIDE 0.9 % IV SOLN
10.0000 mg | Freq: Once | INTRAVENOUS | Status: AC
  Administered 2023-05-04: 10 mg via INTRAVENOUS
  Filled 2023-05-04: qty 10

## 2023-05-04 MED ORDER — SODIUM CHLORIDE 0.9 % IV SOLN
Freq: Once | INTRAVENOUS | Status: AC
  Filled 2023-05-04: qty 250

## 2023-05-04 MED ORDER — HEPARIN SOD (PORK) LOCK FLUSH 100 UNIT/ML IV SOLN
500.0000 [IU] | Freq: Once | INTRAVENOUS | Status: AC | PRN
  Administered 2023-05-04: 500 [IU]
  Filled 2023-05-04: qty 5

## 2023-05-04 MED ORDER — DEXTROSE 5 % IV SOLN
56.0000 mg/m2 | Freq: Once | INTRAVENOUS | Status: AC
  Administered 2023-05-04: 100 mg via INTRAVENOUS
  Filled 2023-05-04: qty 50

## 2023-05-04 NOTE — Patient Instructions (Signed)
[facility]  Discharge Instructions: Thank you for choosing [facility] to provide your oncology and hematology care.  If you have a lab appointment with the Cancer Center, please go directly to the Cancer Center and check in at the registration area.  Wear comfortable clothing and clothing appropriate for easy access to any Portacath or PICC line.   We strive to give you quality time with your provider. You may need to reschedule your appointment if you arrive late (15 or more minutes).  Arriving late affects you and other patients whose appointments are after yours.  Also, if you miss three or more appointments without notifying the office, you may be dismissed from the clinic at the provider's discretion.      For prescription refill requests, have your pharmacy contact our office and allow 72 hours for refills to be completed.    Today you received the following chemotherapy and/or immunotherapy agents- kyprolis      To help prevent nausea and vomiting after your treatment, we encourage you to take your nausea medication as directed.  BELOW ARE SYMPTOMS THAT SHOULD BE REPORTED IMMEDIATELY: *FEVER GREATER THAN 100.4 F (38 C) OR HIGHER *CHILLS OR SWEATING *NAUSEA AND VOMITING THAT IS NOT CONTROLLED WITH YOUR NAUSEA MEDICATION *UNUSUAL SHORTNESS OF BREATH *UNUSUAL BRUISING OR BLEEDING *URINARY PROBLEMS (pain or burning when urinating, or frequent urination) *BOWEL PROBLEMS (unusual diarrhea, constipation, pain near the anus) TENDERNESS IN MOUTH AND THROAT WITH OR WITHOUT PRESENCE OF ULCERS (sore throat, sores in mouth, or a toothache) UNUSUAL RASH, SWELLING OR PAIN  UNUSUAL VAGINAL DISCHARGE OR ITCHING   Items with * indicate a potential emergency and should be followed up as soon as possible or go to the Emergency Department if any problems should occur.  Please show the CHEMOTHERAPY ALERT CARD or IMMUNOTHERAPY ALERT CARD at check-in to  the Emergency Department and triage nurse.  Should you have questions after your visit or need to cancel or reschedule your appointment, please contact [facility]  [phone] and follow the prompts.  Office hours are 8:00 a.m. to 4:30 p.m. Monday - Friday. Please note that voicemails left after 4:00 p.m. may not be returned until the following business day.  We are closed weekends and major holidays. You have access to a nurse at all times for urgent questions. Please call the main number to the clinic [phone] and follow the prompts.  For any non-urgent questions, you may also contact your provider using MyChart. We now offer e-Visits for anyone 18 and older to request care online for non-urgent symptoms. For details visit [email or web address].   Also download the MyChart app! Go to the app store, search "MyChart", open the app, select [facility], and log in with your MyChart username and password.    

## 2023-05-05 LAB — MULTIPLE MYELOMA PANEL, SERUM
Albumin SerPl Elph-Mcnc: 3.3 g/dL (ref 2.9–4.4)
Albumin/Glob SerPl: 1.6 (ref 0.7–1.7)
Alpha 1: 0.1 g/dL (ref 0.0–0.4)
Alpha2 Glob SerPl Elph-Mcnc: 0.5 g/dL (ref 0.4–1.0)
B-Globulin SerPl Elph-Mcnc: 1 g/dL (ref 0.7–1.3)
Gamma Glob SerPl Elph-Mcnc: 0.5 g/dL (ref 0.4–1.8)
Globulin, Total: 2.2 g/dL (ref 2.2–3.9)
IgA: 32 mg/dL — ABNORMAL LOW (ref 64–422)
IgG (Immunoglobin G), Serum: 646 mg/dL (ref 586–1602)
IgM (Immunoglobulin M), Srm: <5 mg/dL — ABNORMAL LOW (ref 26–217)
Total Protein ELP: 5.5 g/dL — ABNORMAL LOW (ref 6.0–8.5)

## 2023-05-09 ENCOUNTER — Encounter: Payer: Self-pay | Admitting: Internal Medicine

## 2023-05-10 ENCOUNTER — Inpatient Hospital Stay: Payer: 59

## 2023-05-10 VITALS — BP 177/80 | HR 58 | Temp 98.6°F | Resp 18 | Ht 62.0 in | Wt 176.6 lb

## 2023-05-10 DIAGNOSIS — Z5112 Encounter for antineoplastic immunotherapy: Secondary | ICD-10-CM | POA: Diagnosis not present

## 2023-05-10 DIAGNOSIS — C9000 Multiple myeloma not having achieved remission: Secondary | ICD-10-CM

## 2023-05-10 DIAGNOSIS — E876 Hypokalemia: Secondary | ICD-10-CM

## 2023-05-10 MED ORDER — DEXTROSE 5 % IV SOLN
Freq: Once | INTRAVENOUS | Status: AC
  Filled 2023-05-10: qty 250

## 2023-05-10 MED ORDER — IMMUNE GLOBULIN (HUMAN) 20 GM/200ML IV SOLN
400.0000 mg/kg | Freq: Once | INTRAVENOUS | Status: AC
  Administered 2023-05-10: 30 g via INTRAVENOUS
  Filled 2023-05-10: qty 300

## 2023-05-10 MED ORDER — HEPARIN SOD (PORK) LOCK FLUSH 100 UNIT/ML IV SOLN
500.0000 [IU] | Freq: Once | INTRAVENOUS | Status: AC | PRN
  Administered 2023-05-10: 500 [IU]
  Filled 2023-05-10: qty 5

## 2023-05-10 MED ORDER — IMMUNE GLOBULIN (HUMAN) 10 GM/100ML IV SOLN
400.0000 mg/kg | Freq: Once | INTRAVENOUS | Status: DC
  Filled 2023-05-10: qty 250

## 2023-05-10 MED ORDER — ACETAMINOPHEN 325 MG PO TABS
650.0000 mg | ORAL_TABLET | Freq: Once | ORAL | Status: AC
  Administered 2023-05-10: 650 mg via ORAL
  Filled 2023-05-10: qty 2

## 2023-05-10 MED ORDER — DIPHENHYDRAMINE HCL 25 MG PO CAPS
25.0000 mg | ORAL_CAPSULE | Freq: Once | ORAL | Status: AC
  Administered 2023-05-10: 25 mg via ORAL
  Filled 2023-05-10: qty 1

## 2023-05-10 MED FILL — Dexamethasone Sodium Phosphate Inj 100 MG/10ML: INTRAMUSCULAR | Qty: 1 | Status: AC

## 2023-05-10 NOTE — Patient Instructions (Signed)

## 2023-05-11 ENCOUNTER — Inpatient Hospital Stay (HOSPITAL_BASED_OUTPATIENT_CLINIC_OR_DEPARTMENT_OTHER): Payer: 59 | Admitting: Internal Medicine

## 2023-05-11 ENCOUNTER — Inpatient Hospital Stay: Payer: 59

## 2023-05-11 ENCOUNTER — Encounter: Payer: Self-pay | Admitting: Internal Medicine

## 2023-05-11 VITALS — BP 159/84 | HR 71 | Temp 98.7°F | Ht 62.0 in | Wt 175.8 lb

## 2023-05-11 VITALS — BP 163/84 | HR 64 | Temp 99.1°F | Resp 18

## 2023-05-11 DIAGNOSIS — Z79899 Other long term (current) drug therapy: Secondary | ICD-10-CM | POA: Diagnosis not present

## 2023-05-11 DIAGNOSIS — C9000 Multiple myeloma not having achieved remission: Secondary | ICD-10-CM

## 2023-05-11 DIAGNOSIS — Z5181 Encounter for therapeutic drug level monitoring: Secondary | ICD-10-CM

## 2023-05-11 DIAGNOSIS — Z5112 Encounter for antineoplastic immunotherapy: Secondary | ICD-10-CM | POA: Diagnosis not present

## 2023-05-11 LAB — CBC WITH DIFFERENTIAL (CANCER CENTER ONLY)
Abs Immature Granulocytes: 0.01 10*3/uL (ref 0.00–0.07)
Basophils Absolute: 0 10*3/uL (ref 0.0–0.1)
Basophils Relative: 1 %
Eosinophils Absolute: 0.2 10*3/uL (ref 0.0–0.5)
Eosinophils Relative: 7 %
HCT: 31.1 % — ABNORMAL LOW (ref 36.0–46.0)
Hemoglobin: 9.8 g/dL — ABNORMAL LOW (ref 12.0–15.0)
Immature Granulocytes: 0 %
Lymphocytes Relative: 32 %
Lymphs Abs: 1 10*3/uL (ref 0.7–4.0)
MCH: 29.4 pg (ref 26.0–34.0)
MCHC: 31.5 g/dL (ref 30.0–36.0)
MCV: 93.4 fL (ref 80.0–100.0)
Monocytes Absolute: 0.3 10*3/uL (ref 0.1–1.0)
Monocytes Relative: 11 %
Neutro Abs: 1.6 10*3/uL — ABNORMAL LOW (ref 1.7–7.7)
Neutrophils Relative %: 49 %
Platelet Count: 101 10*3/uL — ABNORMAL LOW (ref 150–400)
RBC: 3.33 MIL/uL — ABNORMAL LOW (ref 3.87–5.11)
RDW: 15.5 % (ref 11.5–15.5)
WBC Count: 3.2 10*3/uL — ABNORMAL LOW (ref 4.0–10.5)
nRBC: 0 % (ref 0.0–0.2)

## 2023-05-11 LAB — CMP (CANCER CENTER ONLY)
ALT: 10 U/L (ref 0–44)
AST: 13 U/L — ABNORMAL LOW (ref 15–41)
Albumin: 3.9 g/dL (ref 3.5–5.0)
Alkaline Phosphatase: 52 U/L (ref 38–126)
Anion gap: 6 (ref 5–15)
BUN: 16 mg/dL (ref 8–23)
CO2: 22 mmol/L (ref 22–32)
Calcium: 8.1 mg/dL — ABNORMAL LOW (ref 8.9–10.3)
Chloride: 109 mmol/L (ref 98–111)
Creatinine: 0.81 mg/dL (ref 0.44–1.00)
GFR, Estimated: >60 mL/min (ref >60)
Glucose, Bld: 101 mg/dL — ABNORMAL HIGH (ref 70–99)
Potassium: 3.9 mmol/L (ref 3.5–5.1)
Sodium: 137 mmol/L (ref 135–145)
Total Bilirubin: 0.6 mg/dL (ref 0.3–1.2)
Total Protein: 7.2 g/dL (ref 6.5–8.1)

## 2023-05-11 MED ORDER — DIPHENHYDRAMINE HCL 50 MG/ML IJ SOLN
50.0000 mg | Freq: Once | INTRAMUSCULAR | Status: AC
  Administered 2023-05-11: 50 mg via INTRAVENOUS
  Filled 2023-05-11: qty 1

## 2023-05-11 MED ORDER — DEXTROSE 5 % IV SOLN
56.0000 mg/m2 | Freq: Once | INTRAVENOUS | Status: AC
  Administered 2023-05-11: 100 mg via INTRAVENOUS
  Filled 2023-05-11: qty 30

## 2023-05-11 MED ORDER — SODIUM CHLORIDE 0.9 % IV SOLN
Freq: Once | INTRAVENOUS | Status: AC
  Filled 2023-05-11: qty 250

## 2023-05-11 MED ORDER — FAMOTIDINE IN NACL 20-0.9 MG/50ML-% IV SOLN
20.0000 mg | Freq: Once | INTRAVENOUS | Status: AC
  Administered 2023-05-11: 20 mg via INTRAVENOUS
  Filled 2023-05-11: qty 50

## 2023-05-11 MED ORDER — ACETAMINOPHEN 325 MG PO TABS
650.0000 mg | ORAL_TABLET | Freq: Once | ORAL | Status: AC
  Administered 2023-05-11: 650 mg via ORAL
  Filled 2023-05-11: qty 2

## 2023-05-11 MED ORDER — HEPARIN SOD (PORK) LOCK FLUSH 100 UNIT/ML IV SOLN
500.0000 [IU] | Freq: Once | INTRAVENOUS | Status: AC | PRN
  Administered 2023-05-11: 500 [IU]
  Filled 2023-05-11: qty 5

## 2023-05-11 MED ORDER — SODIUM CHLORIDE 0.9 % IV SOLN: 4.0000 mg | Freq: Once | INTRAVENOUS | Status: DC

## 2023-05-11 MED ORDER — DEXAMETHASONE SODIUM PHOSPHATE 10 MG/ML IJ SOLN
4.0000 mg | Freq: Once | INTRAMUSCULAR | Status: AC
  Administered 2023-05-11: 4 mg via INTRAVENOUS
  Filled 2023-05-11: qty 1

## 2023-05-11 MED ORDER — SODIUM CHLORIDE 0.9 % IV SOLN
10.0000 mg/kg | Freq: Once | INTRAVENOUS | Status: AC
  Administered 2023-05-11: 800 mg via INTRAVENOUS
  Filled 2023-05-11: qty 25

## 2023-05-11 MED FILL — Dexamethasone Sodium Phosphate Inj 100 MG/10ML: INTRAMUSCULAR | Qty: 1 | Status: AC

## 2023-05-11 NOTE — Patient Instructions (Signed)
[place] [facility]  Discharge Instructions: Thank you for choosing [facility] to provide your oncology and hematology care.  If you have a lab appointment with the Cancer Center, please go directly to the Cancer Center and check in at the registration area.  Wear comfortable clothing and clothing appropriate for easy access to any Portacath or PICC line.   We strive to give you quality time with your provider. You may need to reschedule your appointment if you arrive late (15 or more minutes).  Arriving late affects you and other patients whose appointments are after yours.  Also, if you miss three or more appointments without notifying the office, you may be dismissed from the clinic at the provider's discretion.      For prescription refill requests, have your pharmacy contact our office and allow 72 hours for refills to be completed.    Today you received the following chemotherapy and/or immunotherapy agents sarclisa and kyprolis      To help prevent nausea and vomiting after your treatment, we encourage you to take your nausea medication as directed.  BELOW ARE SYMPTOMS THAT SHOULD BE REPORTED IMMEDIATELY: *FEVER GREATER THAN 100.4 F (38 C) OR HIGHER *CHILLS OR SWEATING *NAUSEA AND VOMITING THAT IS NOT CONTROLLED WITH YOUR NAUSEA MEDICATION *UNUSUAL SHORTNESS OF BREATH *UNUSUAL BRUISING OR BLEEDING *URINARY PROBLEMS (pain or burning when urinating, or frequent urination) *BOWEL PROBLEMS (unusual diarrhea, constipation, pain near the anus) TENDERNESS IN MOUTH AND THROAT WITH OR WITHOUT PRESENCE OF ULCERS (sore throat, sores in mouth, or a toothache) UNUSUAL RASH, SWELLING OR PAIN  UNUSUAL VAGINAL DISCHARGE OR ITCHING   Items with * indicate a potential emergency and should be followed up as soon as possible or go to the Emergency Department if any problems should occur.  Please show the CHEMOTHERAPY ALERT CARD or IMMUNOTHERAPY ALERT CARD at  check-in to the Emergency Department and triage nurse.  Should you have questions after your visit or need to cancel or reschedule your appointment, please contact [place] [facility]  [phone] and follow the prompts.  Office hours are 8:00 a.m. to 4:30 p.m. Monday - Friday. Please note that voicemails left after 4:00 p.m. may not be returned until the following business day.  We are closed weekends and major holidays. You have access to a nurse at all times for urgent questions. Please call the main number to the clinic [phone] and follow the prompts.  For any non-urgent questions, you may also contact your provider using MyChart. We now offer e-Visits for anyone 85 and older to request care online for non-urgent symptoms. For details visit [email or web address].   Also download the MyChart app! Go to the app store, search "MyChart", open the app, select [place], and log in with your MyChart username and password.

## 2023-05-11 NOTE — Progress Notes (Signed)
No questions/concerns today.

## 2023-05-11 NOTE — Progress Notes (Signed)
[facility] OFFICE PROGRESS NOTE  Patient Care Team: Leanna Sato, MD as PCP - General (Family Medicine) End, Cristal Deer, MD as PCP - Cardiology (Cardiology) Eddie Candle, [name] Earna Coder, MD as Consulting Physician (Hematology and Oncology) Vida Rigger, MD as Consulting Physician (Pulmonary Disease) Tedd Sias Marlana Salvage, MD as Consulting Physician (Endocrinology)   Cancer Staging  No matching staging information was found for the patient.   Oncology History Overview Note  # SEP 2018- MULTIPLE MYELOMA IgALamda [2.5 gm/dl; K/L= 41/6606]; STAGE III [beta 2 microglobulin=5.5] [presented with acute renal failure; anemia; NO hypercalcemia; Skeletal survey-Normal]; BMBx- 45% plasma cells; FISH-POSITIVE 11:14 translocation.[STANDARD-high RISK]/cyto-Normal; SEP 2018- PET- L3 posterior element lesion.   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 No morphologic support for residual myeloma disease. Negative for minimal residual disease by MM-MRD flow cytometry; HOLD REVLIMID [leg swelling]; AUG 2021-PET scan negative for myeloma; continue to hold Revlimid  # MARCH 2021-diastolic congestive heart failure [Dr.End]  # BMBx- OCT 2021- BMBx- 5% plasmacytosis-however this appears to be more polyclonal rather than monoclonal-not explain patient worsening anemia.   # OCT 2022- 18th- Dara- Rev-Dex; MARCH, 2024- PROGRESSION bone marrow biopsy  70% plasma cells; PET scan no bone lesions-however conus medullaris uptake-MRI lumbar spine-   # MAY 2024- DISCONTINUE carfilzomib-dexamethasone- Pomalyst; [rising M-protein; MAY 2024- 1.3. ]   # start 12/27/2022 -Isa-Carfil-Dex;  [decrease dexamethasone 20 weekly- starting 05/04/23- sec to glaucoma].  #November 2021-hyperthyroidism/goiter- [D.Bennett/Dr.Solum]-methimazole. S/p Thyroidectomy [Dr.Kim; [facility]-AUG 2022]  --------------------------  # 12/12- RIGHT JUGULAR DVT-x 34m on xarelto; finished April 2020; September 2020-EGD/dysphagia; Dr. Tobi Bastos  # Acute renal failure [Dr.Singh; Proteinuria 1.5gm/day ]; acyclovir/Asprin ------------------------------------------------------------------------------------------------------------   DIAGNOSIS: [ ]  MULTIPLE MYELOMA  STAGE: III/HIGH RISK ;GOALS: CONTROL      Multiple myeloma not having achieved remission (HCC)  11/21/2017 - 04/28/2018 Chemotherapy   Patient is on Treatment Plan : MYELOMA SALVAGE Cyclophosphamide / Carfilzomib / Dexamethasone (CCd) q28d     05/12/2021 - 02/25/2022 Chemotherapy   Patient is on Treatment Plan : MYELOMA RELAPSED REFRACTORY Daratumumab SQ + Lenalidomide + Dexamethasone (DaraRd) q28d     05/12/2021 - 07/23/2022 Chemotherapy   Patient is on Treatment Plan : MYELOMA RELAPSED REFRACTORY Daratumumab SQ + Lenalidomide + Dexamethasone (DaraRd) q28d     11/02/2022 - 11/16/2022 Chemotherapy   Patient is on Treatment Plan : MYELOMA RELAPSED/ REFRACTORY Carfilzomib D1,8,15 (20/27) + Pomalidomide + Dexamethasone (KPd) q28d     12/27/2022 -  Chemotherapy   Patient is on Treatment Plan : MYELOMA Carfilzomib + Dexamethasone + Isatuximab q28d      INTERVAL HISTORY: Ambulating  independently.   Alone.  Brandi [name] 72 y.o.  female pleasant patient above history of relapsed multiple myeloma--currently  [name]- Carfil-Dex is here for a follow up.  Patient noted to have Glaucoma BIL- L > R [Dr.King; [facility]] on eye drops.  Awaiting re-check this week. Vision not any worse; and denies any eye pain.   Patient denies any pain. Appetite is good. No  dyspnea . Mild  fatigue.  No fevers or chills. No infections. Denies any worsening pain.  Patient taking tramadol as needed for upper and leg cramps. Mild tingling in feet.    Review of Systems  Constitutional:  Positive for malaise/fatigue. Negative for chills, diaphoresis and fever.  HENT:  Negative for nosebleeds and sore throat.   Eyes:  Negative for double vision.  Respiratory:  Negative for hemoptysis.   Cardiovascular:  Negative for chest pain, palpitations and orthopnea.  Gastrointestinal:  Negative for abdominal pain, blood in stool, constipation, heartburn and melena.  Genitourinary:  Negative for dysuria, frequency and urgency.  Musculoskeletal:  Positive for back pain.  Skin: Negative.  Negative for itching and rash.  Neurological:  Negative for dizziness, tingling, focal weakness, weakness and headaches.  Endo/Heme/Allergies:  Does not bruise/bleed easily.  Psychiatric/Behavioral:  Negative for depression. The patient is not nervous/anxious.     PAST MEDICAL HISTORY :  Past Medical History:  Diagnosis Date   (HFpEF) heart failure with preserved ejection fraction (HCC)    a. 06/2020 Echo: EF 60-65%, no rwma, nl RV size/fxn. Triv MR. Triv TR/AI. Mod elev PASP.   Anxiety    COPD (chronic obstructive pulmonary disease) (HCC)    GERD (gastroesophageal reflux disease)    Hypertension    Hyperthyroidism    Hypokalemia    IgA myeloma (HCC)    Multiple myeloma (HCC)    Pneumonia    Red blood cell antibody positive, compatible PRBC difficult to obtain    S/P autologous bone marrow transplantation (HCC)     PAST SURGICAL HISTORY :   Past Surgical History:  Procedure Laterality Date   ANTERIOR VITRECTOMY Left 10/07/2021   Procedure: ANTERIOR VITRECTOMY;  Surgeon: Lockie Mola, MD;  Location: [facility];  Service: Ophthalmology;   Laterality: Left;   CATARACT EXTRACTION W/PHACO Left 10/07/2021   Procedure: CATARACT EXTRACTION PHACO AND INTRAOCULAR LENS PLACEMENT (IOC) LEFT VISION BLUE;  Surgeon: Lockie Mola, MD;  Location: [facility];  Service: Ophthalmology;  Laterality: Left;  kit for manual incision 14.56 01:21.4   CATARACT EXTRACTION W/PHACO Right 10/21/2021   Procedure: CATARACT EXTRACTION PHACO AND INTRAOCULAR LENS PLACEMENT (IOC) RIGHT 11.12 01:48.1;  Surgeon: Lockie Mola, MD;  Location: [facility];  Service: Ophthalmology;  Laterality: Right;   ESOPHAGOGASTRODUODENOSCOPY (EGD) WITH PROPOFOL N/A 03/30/2019   Procedure: ESOPHAGOGASTRODUODENOSCOPY (EGD) WITH PROPOFOL;  Surgeon: Wyline Mood, MD;  Location: [facility];  Service: Gastroenterology;  Laterality: N/A;   IR FLUORO GUIDE PORT INSERTION RIGHT  12/16/2017   IR IMAGING GUIDED PORT INSERTION  06/15/2021   KNEE ARTHROSCOPY Left 09/03/2022   Procedure: ARTHROSCOPY KNEE DEBRIDEMENT;  Surgeon: Lyndle Herrlich, MD;  Location: [facility] ORS;  Service: Orthopedics;  Laterality: Left;   THYROIDECTOMY N/A     FAMILY HISTORY :   Family History  Problem Relation Age of Onset   Pneumonia Mother    Seizures Father     SOCIAL HISTORY:   Social History   Tobacco Use   Smoking status: Former    Current packs/day: 0.00    Types: Cigarettes    Quit date: 03/18/2021    Years since quitting: 2.1   Smokeless tobacco: Never   Tobacco comments:    2 cigarettes QOD  Vaping Use   Vaping status: Never Used  Substance Use Topics   Alcohol use: No   Drug use: No    ALLERGIES:  has No Known Allergies.  MEDICATIONS:  Current Outpatient Medications  Medication Sig Dispense Refill   acyclovir (ZOVIRAX)  400 MG tablet Take 1 tablet by mouth twice daily 60 tablet 0   albuterol (VENTOLIN HFA) 108 (90 Base) MCG/ACT inhaler Inhale 2 puffs into the lungs every 6 (six) hours as needed for wheezing or shortness of breath. 8 g 3   aspirin EC 81  MG EC tablet Take 1 tablet (81 mg total) by mouth daily. 90 tablet 3   calcium-vitamin D (OSCAL WITH D) 500MG -200UNIT ( ) tablet Take 1 tablet by mouth 2 (two) times daily. 60 tablet 0   carvedilol (COREG) 3.125 MG tablet Take 1 tablet (3.125 mg total) by mouth 2 (two) times daily with a meal. 60 tablet 5   furosemide (LASIX) 40 MG tablet Take 1 tablet (40 mg total) by mouth daily. 90 tablet 3   levothyroxine (SYNTHROID) 150 MCG tablet Take 1 tablet (150 mcg total) by mouth daily before breakfast. Take before eating or drinking anything. DO Not  eat or drink until after an hour later after taking medication. 30 tablet 3   lidocaine-prilocaine (EMLA) cream Apply 1 application topically as needed. Apply to port and cover with saran wrap 1-2 hours prior to port access 30 g 1   magnesium oxide (MAG-OX) 400 (240 Mg) MG tablet Take 1 tablet (400 mg total) by mouth 2 (two) times daily. 60 tablet 3   montelukast (SINGULAIR) 10 MG tablet Take 1 tablet (10 mg total) by mouth at bedtime. 30 tablet 3   ondansetron (ZOFRAN) 8 MG tablet One pill every 8 hours as needed for nausea/vomitting. 40 tablet 1   pantoprazole (PROTONIX) 40 MG tablet Take 1 tablet (40 mg total) by mouth daily. 90 tablet 0   polyethylene glycol (MIRALAX / GLYCOLAX) packet Take 17 g by mouth daily as needed for mild constipation. 14 each 0   potassium chloride SA (KLOR-CON M) 20 MEQ tablet Take 1 tablet (20 mEq total) by mouth 2 (two) times daily. 60 tablet 6   spironolactone (ALDACTONE) 25 MG tablet Take 1 tablet by mouth once daily 90 tablet 3   traMADol (ULTRAM) 50 MG tablet Take 1 tablet (50 mg total) by mouth every 12 (twelve) hours as needed. 60 tablet 0   Vitamin D, Ergocalciferol, (DRISDOL) 1.25 MG (50000 UNIT) CAPS capsule Take 1 capsule (50,000 Units total) by mouth once a week. 12 capsule 0   No current facility-administered medications for this visit.   Facility-Administered Medications Ordered in Other Visits  Medication  Dose Route Frequency Provider Last Rate Last Admin   0.9 %  sodium chloride infusion   Intravenous Continuous Ralene Muskrat, PA-C       0.9 %  sodium chloride infusion   Intravenous Once Earna Coder, MD       carfilzomib (KYPROLIS) 100 mg in dextrose 5 % 100 mL chemo infusion  56 mg/m2 (Treatment Plan Recorded) Intravenous Once Earna Coder, MD       dexamethasone (DECADRON) injection 4 mg  4 mg Intravenous Once Earna Coder, MD       isatuximab-irfc (SARCLISA) 800 mg in sodium chloride 0.9 % 210 mL (3.2 mg/mL) chemo infusion  10 mg/kg (Treatment Plan Recorded) Intravenous Once Earna Coder, MD        PHYSICAL EXAMINATION:   BP (!) 159/84 (BP Location: Right Arm, Patient Position: Sitting, Cuff Size: Large)   Pulse 71   Temp 98.7 F (37.1 C) (Tympanic)   Ht 5\' 2"  (1.575 m)   Wt 175 lb 12.8 oz (79.7 kg)   SpO2 100%  BMI 32.15 kg/m   Filed Weights   05/11/23 0825  Weight: 175 lb 12.8 oz (79.7 kg)      Mild leg swelling.   Physical Exam HENT:     Head: Normocephalic and atraumatic.  Eyes:     Pupils: Pupils are equal, round, and reactive to light.  Cardiovascular:     Rate and Rhythm: Normal rate and regular rhythm.  Pulmonary:     Effort: Pulmonary effort is normal. No respiratory distress.     Breath sounds: Normal breath sounds. No wheezing.  Abdominal:     General: Bowel sounds are normal. There is no distension.     Palpations: Abdomen is soft. There is no mass.     Tenderness: There is no abdominal tenderness. There is no guarding or rebound.  Musculoskeletal:        General: No tenderness. Normal range of motion.     Cervical back: Normal range of motion and neck supple.  Skin:    General: Skin is warm.  Neurological:     Mental Status: She is alert and oriented to person, place, and time.  Psychiatric:        Mood and Affect: Affect normal.    LABORATORY DATA:  I have reviewed the data as listed    Component Value  Date/Time   NA 137 05/11/2023 0812   NA 141 01/25/2020 1530   K 3.9 05/11/2023 0812   CL 109 05/11/2023 0812   CO2 22 05/11/2023 0812   GLUCOSE 101 (H) 05/11/2023 0812   BUN 16 05/11/2023 0812   BUN 19 01/25/2020 1530   CREATININE 0.81 05/11/2023 0812   CALCIUM 8.1 (L) 05/11/2023 0812   PROT 7.2 05/11/2023 0812   ALBUMIN 3.9 05/11/2023 0812   AST 13 (L) 05/11/2023 0812   ALT 10 05/11/2023 0812   ALKPHOS 52 05/11/2023 0812   BILITOT 0.6 05/11/2023 0812   GFRNONAA >60 05/11/2023 0812   GFRAA NOT CALCULATED 04/17/2020 1352    No results found for: "SPEP", "UPEP"  Lab Results  Component Value Date   WBC 3.2 (L) 05/11/2023   NEUTROABS 1.6 (L) 05/11/2023   HGB 9.8 (L) 05/11/2023   HCT 31.1 (L) 05/11/2023   MCV 93.4 05/11/2023   PLT 101 (L) 05/11/2023      Chemistry      Component Value Date/Time   NA 137 05/11/2023 0812   NA 141 01/25/2020 1530   K 3.9 05/11/2023 0812   CL 109 05/11/2023 0812   CO2 22 05/11/2023 0812   BUN 16 05/11/2023 0812   BUN 19 01/25/2020 1530   CREATININE 0.81 05/11/2023 0812      Component Value Date/Time   CALCIUM 8.1 (L) 05/11/2023 0812   ALKPHOS 52 05/11/2023 0812   AST 13 (L) 05/11/2023 0812   ALT 10 05/11/2023 0812   BILITOT 0.6 05/11/2023 0812      (H): Data is abnormally high   Latest Reference Range & Units Most Recent 12/24/20 14:01 04/01/21 14:34 04/28/21 09:17 06/11/21 09:42 07/03/21 10:32 08/20/21 10:06  M Protein SerPl Elph-Mcnc Not Observed g/dL 1.1 (H) (C) 2/53/66 44:03 Not Observed (C) 0.6 (H) (C) 0.7 (H) (C) 0.5 (H) (C) 0.7 (H) (C) 1.1 (H) (C)    Latest Reference Range & Units Most Recent 09/17/21 08:47 10/15/21 08:25 11/12/21 08:55  Kappa free light chain 3.3 - 19.4 mg/L 5.0 11/12/21 08:55 7.2 7.2 5.0  Lambda free light chains 5.7 - 26.3 mg/L 223.6 (H) 11/12/21 08:55 215.3 (H)  340.2 (H) 223.6 (H)  Kappa, lambda light chain ratio 0.26 - 1.65  0.02 (L) 11/12/21 08:55 0.03 (L) 0.02 (L) 0.02 (L)  (H): Data is abnormally  high (L): Data is abnormally low  RADIOGRAPHIC STUDIES: I have personally reviewed the radiological images as listed and agreed with the findings in the report. No results found.   ASSESSMENT & PLAN:  Multiple myeloma not having achieved remission (HCC) # RECURRENT Multiple myeloma stage III [high-risk cytogenetics-status post KRD-VGPR ; status post autologous stem cell transplant on 06/15/18.  Most recently progressed on salvage therapy with daratumumab  lenalidomide [15 mg 3 w; 1-w] dexamethasone. plasma cell myeloma by approximately 70% of the overall cellular marrow;  FEB 2024-lambda light chain 284 Bone marrow-70% plasma cells. M Protein 0.9-rising lambda light chain ratio. Discontinue Revlimid; and dara-given the progressive disease.  MARCH 2024- PET scan: No skeletal lesions noted. APRIl 2024- MRI-cervical/thoracic/lumbar spine-negative for any lesions in the spinal cord. DISCONTINUEPomalyst;- carfilzomib-dexamethasone- [rising M-protein; MAY 2024- 1.3. ]start 12/27/2022-Isa-Carfil-Dex [[facility]]  #  12/27/2022-Isa-Dex; d-2carfil weekly q 28 days  [[facility]] MARCH 2024-Echo [[facility]] Left ventricular EF 50 to 55%; AUG 13th, 2024- EF-45-50%. Monitor for now. Will get 2 d echo.   # Partial response noted.  Proceed with cycle #5 day-15 today [decrease dexamethasone 8 mg weekly- starting 05/04/23- sec to glaucoma]. OCT 2024- myeloma panel kappa lambda light chain today= WNL. Will repeat bone marrow biopsy. Will give a break from chemo to re-assess response to chemo/ and galucoma.   # Electrolyte: Hypokalemia: mild-  continue Kdur TID; hypocalcemia- 8.0- vit D SEP 2024- 50- continie Ergo 50k weekly. REcommend ca BID  Stable.   # Iatrogenic hypothyroidism [ Graves/goiter s/p total thyroidectomy;aug,2022 ]-currently on Synthroid 150 mcg/day-[increased in AUG, 2024-  again recommend  taking the pills fasting- stable.  sep 2024- Vit D 54. stable  # Glaucoma BIL- L > R [Dr.King; [facility]] on eye drops. Will  decrease the steroids.  Discussed with ophthalmology.  # Lower back discomfort x 6 months-see above.  MRI lumbar spine as above.  Continue tramadol. stable  # Bone lesions /last zometa on 11/27/2017.  S/p   Dental extraction PET scan evidence of any bone lesions.-Hold Zometa for now.stable  # Infection prophylaxis: Acyclovir; asprin- add IVIG infusion [IgG < 400]- plan  IVIG- q 4 w #1 on- 9/17- stable  # IV access: mediport- functional  # Vaccination: Flu shot today [sep 2024] s/p Pneumonia vaccination [march 2024]- stable.   6 w- given dental extrac- on 1/26. :MM panel; K/l light chain ratio q 4 W  D-isa-dex;car1&2-car; weekly x 3; week 4-OFF;  q 28 day cycle; NO zometa- dental issues-ok; but hypocalemia  # DISPOSITION: # as per IS-  chemo today; and tomorrow  # bone marrow biopsy in 2 weeks. . # 2 D echo ASAP # in 4 weeks MD labs-cbc;bmp; MM panel; k/l light chains; No Chemo---Dr.B   Orders Placed This Encounter  Procedures   CT BONE MARROW BIOPSY & ASPIRATION    Standing Status:   Future    Standing Expiration Date:   05/10/2024    Order Specific Question:   Reason for Exam (SYMPTOM  OR DIAGNOSIS REQUIRED)    Answer:   multipl myeloma    Order Specific Question:   Preferred imaging location?    Answer:   [facility]    Order Specific Question:   Radiology Contrast Protocol - do NOT remove file path    Answer:   [email or web address]  CBC with Differential (Cancer Center Only)    Standing Status:   Future    Standing Expiration Date:   05/10/2024   Basic metabolic panel    Standing Status:   Future    Standing Expiration Date:   05/10/2024   Multiple Myeloma Panel (SPEP&IFE w/QIG)    Standing Status:   Future    Standing Expiration Date:   05/10/2024   Kappa/lambda light chains    Standing Status:   Future    Standing Expiration Date:   05/10/2024   ECHOCARDIOGRAM COMPLETE    Standing Status:   Future    Standing Expiration Date:    05/10/2024    Order Specific Question:   Where should this test be performed    Answer:   [facility]    Order Specific Question:   Perflutren DEFINITY (image enhancing agent) should be administered unless hypersensitivity or allergy exist    Answer:   Administer Perflutren    Order Specific Question:   Reason for exam-Echo    Answer:   Chemo  Z09   All questions were answered. The patient knows to call the clinic with any problems, questions or concerns.      Earna Coder, MD 05/11/2023 9:58 AM End

## 2023-05-11 NOTE — Assessment & Plan Note (Signed)
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  Most recently progressed on salvage therapy with daratumumab  lenalidomide [15 mg 3 w; 1-w] dexamethasone. plasma cell myeloma by approximately 70% of the overall cellular marrow;  FEB 2024-lambda light chain 284 Bone marrow-70% plasma cells. M Protein 0.9-rising lambda light chain ratio. Discontinue Revlimid; and dara-given the progressive disease.  MARCH 2024- PET scan: No skeletal lesions noted. APRIl 2024- MRI-cervical/thoracic/lumbar spine-negative for any lesions in the spinal cord. DISCONTINUEPomalyst;- carfilzomib-dexamethasone- [rising M-protein; MAY 2024- 1.3. ]start 12/27/2022-Isa-Carfil-Dex [[facility]]  #  12/27/2022-Isa-Dex; d-2carfil weekly q 28 days  [[facility]] MARCH 2024-Echo [[facility]] Left ventricular EF 50 to 55%; AUG 13th, 2024- EF-45-50%. Monitor for now. Will get 2 d echo.   # Partial response noted.  Proceed with cycle #5 day-15 today [decrease dexamethasone 8 mg weekly- starting 05/04/23- sec to glaucoma]. OCT 2024- myeloma panel kappa lambda light chain today= WNL. Will repeat bone marrow biopsy. Will give a break from chemo to re-assess response to chemo/ and galucoma.   # Electrolyte: Hypokalemia: mild-  continue Kdur TID; hypocalcemia- 8.0- vit D SEP 2024- 50- continie Ergo 50k weekly. REcommend ca BID  Stable.   # Iatrogenic hypothyroidism [ Graves/goiter s/p total thyroidectomy;aug,2022 ]-currently on Synthroid 150 mcg/day-[increased in AUG, 2024-  again recommend  taking the pills fasting- stable.  sep 2024- Vit D 54. stable  # Glaucoma BIL- L > R [Dr.King; [facility]] on eye drops. Will decrease the steroids.  Discussed with ophthalmology.  # Lower back discomfort x 6 months-see above.  MRI lumbar spine as above.  Continue tramadol. stable  # Bone lesions /last zometa on 11/27/2017.  S/p   Dental extraction PET scan evidence of any bone  lesions.-Hold Zometa for now.stable  # Infection prophylaxis: Acyclovir; asprin- add IVIG infusion [IgG < 400]- plan  IVIG- q 4 w #1 on- 9/17- stable  # IV access: mediport- functional  # Vaccination: Flu shot today [sep 2024] s/p Pneumonia vaccination [march 2024]- stable.   6 w- given dental extrac- on 1/26. :MM panel; K/l light chain ratio q 4 W  D-isa-dex;car1&2-car; weekly x 3; week 4-OFF;  q 28 day cycle; NO zometa- dental issues-ok; but hypocalemia  # DISPOSITION: # as per IS-  chemo today; and tomorrow  # bone marrow biopsy in 2 weeks. . # 2 D echo ASAP # in 4 weeks MD labs-cbc;bmp; MM panel; k/l light chains; No Chemo---Dr.B

## 2023-05-12 ENCOUNTER — Inpatient Hospital Stay: Payer: 59

## 2023-05-12 ENCOUNTER — Telehealth: Payer: Self-pay | Admitting: *Deleted

## 2023-05-12 VITALS — BP 116/64 | HR 50 | Temp 97.5°F | Resp 22

## 2023-05-12 DIAGNOSIS — C9000 Multiple myeloma not having achieved remission: Secondary | ICD-10-CM

## 2023-05-12 DIAGNOSIS — Z5112 Encounter for antineoplastic immunotherapy: Secondary | ICD-10-CM | POA: Diagnosis not present

## 2023-05-12 MED ORDER — HEPARIN SOD (PORK) LOCK FLUSH 100 UNIT/ML IV SOLN
500.0000 [IU] | Freq: Once | INTRAVENOUS | Status: AC | PRN
  Administered 2023-05-12: 500 [IU]
  Filled 2023-05-12: qty 5

## 2023-05-12 MED ORDER — DEXTROSE 5 % IV SOLN
56.0000 mg/m2 | Freq: Once | INTRAVENOUS | Status: AC
  Administered 2023-05-12: 100 mg via INTRAVENOUS
  Filled 2023-05-12: qty 30

## 2023-05-12 MED ORDER — SODIUM CHLORIDE 0.9% FLUSH
10.0000 mL | INTRAVENOUS | Status: DC | PRN
  Administered 2023-05-12: 10 mL
  Filled 2023-05-12: qty 10

## 2023-05-12 MED ORDER — DEXAMETHASONE SODIUM PHOSPHATE 10 MG/ML IJ SOLN
4.0000 mg | Freq: Once | INTRAMUSCULAR | Status: AC
  Administered 2023-05-12: 4 mg via INTRAVENOUS
  Filled 2023-05-12: qty 1

## 2023-05-12 MED ORDER — SODIUM CHLORIDE 0.9 % IV SOLN
Freq: Once | INTRAVENOUS | Status: AC
  Filled 2023-05-12: qty 250

## 2023-05-12 NOTE — Patient Instructions (Signed)
[place] [facility]  Discharge Instructions: Thank you for choosing [facility] to provide your oncology and hematology care.  If you have a lab appointment with the Cancer Center, please go directly to the Cancer Center and check in at the registration area.  Wear comfortable clothing and clothing appropriate for easy access to any Portacath or PICC line.   We strive to give you quality time with your provider. You may need to reschedule your appointment if you arrive late (15 or more minutes).  Arriving late affects you and other patients whose appointments are after yours.  Also, if you miss three or more appointments without notifying the office, you may be dismissed from the clinic at the provider's discretion.      For prescription refill requests, have your pharmacy contact our office and allow 72 hours for refills to be completed.    Today you received the following chemotherapy and/or immunotherapy agents Kyprolis      To help prevent nausea and vomiting after your treatment, we encourage you to take your nausea medication as directed.  BELOW ARE SYMPTOMS THAT SHOULD BE REPORTED IMMEDIATELY: *FEVER GREATER THAN 100.4 F (38 C) OR HIGHER *CHILLS OR SWEATING *NAUSEA AND VOMITING THAT IS NOT CONTROLLED WITH YOUR NAUSEA MEDICATION *UNUSUAL SHORTNESS OF BREATH *UNUSUAL BRUISING OR BLEEDING *URINARY PROBLEMS (pain or burning when urinating, or frequent urination) *BOWEL PROBLEMS (unusual diarrhea, constipation, pain near the anus) TENDERNESS IN MOUTH AND THROAT WITH OR WITHOUT PRESENCE OF ULCERS (sore throat, sores in mouth, or a toothache) UNUSUAL RASH, SWELLING OR PAIN  UNUSUAL VAGINAL DISCHARGE OR ITCHING   Items with * indicate a potential emergency and should be followed up as soon as possible or go to the Emergency Department if any problems should occur.  Please show the CHEMOTHERAPY ALERT CARD or IMMUNOTHERAPY ALERT CARD at check-in to  the Emergency Department and triage nurse.  Should you have questions after your visit or need to cancel or reschedule your appointment, please contact [place] [facility]  [phone] and follow the prompts.  Office hours are 8:00 a.m. to 4:30 p.m. Monday - Friday. Please note that voicemails left after 4:00 p.m. may not be returned until the following business day.  We are closed weekends and major holidays. You have access to a nurse at all times for urgent questions. Please call the main number to the clinic [phone] and follow the prompts.  For any non-urgent questions, you may also contact your provider using MyChart. We now offer e-Visits for anyone 34 and older to request care online for non-urgent symptoms. For details visit [email or web address].   Also download the MyChart app! Go to the app store, search "MyChart", open the app, select [place], and log in with your MyChart username and password.

## 2023-05-12 NOTE — Telephone Encounter (Signed)
Spoke with patient while in clinic regarding a bone marrow biopsy. She has agreed to [facility] Oct 30 th Arrival time 7:30am. Procedure will be done at [facility], beside the ED. Do not eat or drink after midnight. Will need a driver due to receiving moderate sedation. Understands the above directions.

## 2023-05-13 ENCOUNTER — Other Ambulatory Visit: Payer: Self-pay

## 2023-05-18 ENCOUNTER — Ambulatory Visit
Admission: RE | Admit: 2023-05-18 | Discharge: 2023-05-18 | Disposition: A | Discharge status: Home / Self Care | Payer: 59 | Source: Ambulatory Visit | Attending: Internal Medicine | Admitting: Internal Medicine

## 2023-05-18 DIAGNOSIS — F419 Anxiety disorder, unspecified: Secondary | ICD-10-CM | POA: Insufficient documentation

## 2023-05-18 DIAGNOSIS — Z5181 Encounter for therapeutic drug level monitoring: Secondary | ICD-10-CM | POA: Insufficient documentation

## 2023-05-18 DIAGNOSIS — Z79899 Other long term (current) drug therapy: Secondary | ICD-10-CM | POA: Insufficient documentation

## 2023-05-18 DIAGNOSIS — I10 Essential (primary) hypertension: Secondary | ICD-10-CM | POA: Diagnosis not present

## 2023-05-18 DIAGNOSIS — J449 Chronic obstructive pulmonary disease, unspecified: Secondary | ICD-10-CM | POA: Diagnosis not present

## 2023-05-18 LAB — ECHOCARDIOGRAM COMPLETE
AR max vel: 1.95 cm2
AV Area VTI: 2.22 cm2
AV Area mean vel: 1.86 cm2
AV Mean grad: 2 mm[Hg]
AV Peak grad: 4.6 mm[Hg]
Ao pk vel: 1.07 m/s
Area-P 1/2: 2.74 cm2
S' Lateral: 2.5 cm

## 2023-05-18 NOTE — Progress Notes (Signed)
*  PRELIMINARY RESULTS* Echocardiogram 2D Echocardiogram has been performed.  Cristela Blue 05/18/2023, 10:50 AM

## 2023-05-24 ENCOUNTER — Other Ambulatory Visit (HOSPITAL_COMMUNITY): Payer: Self-pay | Admitting: Student

## 2023-05-24 DIAGNOSIS — C9000 Multiple myeloma not having achieved remission: Secondary | ICD-10-CM

## 2023-05-24 NOTE — Progress Notes (Signed)
Patient for IR Bone Marrow Biopsy on Wed 05/25/2023, I called and spoke with the patient on the phone and gave pre-procedure instructions. Pt was made aware to be here at 7:30a, NPO after MN prior to procedure as well as driver post procedure/recovery/discharge. Pt stated understanding.  Called 05/24/2023

## 2023-05-24 NOTE — H&P (Signed)
Chief Complaint: Patient was seen in consultation today for bone marrow biopsy with aspiration; multiple myeloma.  Referring Physician(s): Earna Coder  Supervising Physician: Pernell [name]  Patient Status: [facility] - Out-pt  History of Present Illness: Brandi [name] is a 72 y.o. female with a medical history significant for heart failure, anxiety, COPD, HTN and multiple myeloma. 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 She is familiar to IR from several bone marrow biopsies and port placements/removalss. She experienced disease progression on salvage therapy and is now on a new line of therapy. Her oncology team has requested a bone marrow biopsy with aspiration to assess treatment response.   Past Medical History:  Diagnosis Date   (HFpEF) heart failure with preserved ejection fraction (HCC)    a. 06/2020 Echo: EF 60-65%, no rwma, nl RV size/fxn. Triv MR. Triv TR/AI. Mod elev PASP.   Anxiety    COPD (chronic obstructive pulmonary disease) (HCC)    GERD (gastroesophageal reflux disease)    Hypertension    Hyperthyroidism    Hypokalemia    IgA myeloma (HCC)    Multiple myeloma (HCC)    Pneumonia    Red blood cell antibody positive, compatible PRBC difficult to obtain    S/P autologous bone marrow transplantation [facility])     Past Surgical History:  Procedure Laterality Date   ANTERIOR VITRECTOMY Left 10/07/2021   Procedure: ANTERIOR VITRECTOMY;  Surgeon: Lockie Mola, MD;  Location: [facility];  Service: Ophthalmology;  Laterality: Left;   CATARACT EXTRACTION W/PHACO Left 10/07/2021   Procedure: CATARACT EXTRACTION PHACO AND INTRAOCULAR LENS PLACEMENT (IOC) LEFT VISION BLUE;  Surgeon: Lockie Mola, MD;  Location: [facility];  Service: Ophthalmology;  Laterality: Left;  kit for manual incision 14.56 01:21.4   CATARACT EXTRACTION W/PHACO Right 10/21/2021   Procedure: CATARACT EXTRACTION PHACO AND  INTRAOCULAR LENS PLACEMENT (IOC) RIGHT 11.12 01:48.1;  Surgeon: Lockie Mola, MD;  Location: [facility];  Service: Ophthalmology;  Laterality: Right;   ESOPHAGOGASTRODUODENOSCOPY (EGD) WITH PROPOFOL N/A 03/30/2019   Procedure: ESOPHAGOGASTRODUODENOSCOPY (EGD) WITH PROPOFOL;  Surgeon: Wyline Mood, MD;  Location: [facility];  Service: Gastroenterology;  Laterality: N/A;   IR FLUORO GUIDE PORT INSERTION RIGHT  12/16/2017   IR IMAGING GUIDED PORT INSERTION  06/15/2021   KNEE ARTHROSCOPY Left 09/03/2022   Procedure: ARTHROSCOPY KNEE DEBRIDEMENT;  Surgeon: Lyndle Herrlich, MD;  Location: [facility] ORS;  Service: Orthopedics;  Laterality: Left;   THYROIDECTOMY N/A     Allergies: Patient has no known allergies.  Medications: Prior to Admission medications   Medication Sig Start Date End Date Taking? Authorizing Provider  acyclovir (ZOVIRAX) 400 MG tablet Take 1 tablet by mouth twice daily 12/13/22   Earna Coder, MD  albuterol (VENTOLIN HFA) 108 (90 Base) MCG/ACT inhaler Inhale 2 puffs into the lungs every 6 (six) hours as needed for wheezing or shortness of breath. 02/25/22   Rushie Chestnut, PA-C  aspirin EC 81 MG EC tablet Take 1 tablet (81 mg total) by mouth daily. 01/25/20   Alver Sorrow, NP  brimonidine (ALPHAGAN P) 0.1 % SOLN Place 1 drop into both eyes 2 (two) times daily.    [provider]  Brinzolamide-Brimonidine (SIMBRINZA) 1-0.2 % SUSP Apply 1 drop to eye in the morning and at bedtime. I drop both eyes twice a day    [provider]  calcium-vitamin D (OSCAL WITH D) 500MG -200UNIT ( ) tablet Take 1 tablet by mouth 2 (two) times  daily. 05/18/21   Borders, Daryl Eastern, NP  carvedilol (COREG) 3.125 MG tablet Take 1 tablet (3.125 mg total) by mouth 2 (two) times daily with a meal. 11/27/21   Furth, Cadence H, PA-C  furosemide (LASIX) 40 MG tablet Take 1 tablet (40 mg total) by mouth daily. 10/02/21   Furth, Cadence H, PA-C  Latanoprostene Bunod  (VYZULTA) 0.024 % SOLN Apply 1 drop to eye at bedtime. 1 drop Left eye every night    [provider]  levothyroxine (SYNTHROID) 150 MCG tablet Take 1 tablet (150 mcg total) by mouth daily before breakfast. Take before eating or drinking anything. DO Not  eat or drink until after an hour later after taking medication. 03/02/23   Earna Coder, MD  lidocaine-prilocaine (EMLA) cream Apply 1 application topically as needed. Apply to port and cover with saran wrap 1-2 hours prior to port access 07/03/21   Earna Coder, MD  magnesium oxide (MAG-OX) 400 (240 Mg) MG tablet Take 1 tablet (400 mg total) by mouth 2 (two) times daily. 05/03/23   Earna Coder, MD  montelukast (SINGULAIR) 10 MG tablet Take 1 tablet (10 mg total) by mouth at bedtime. 11/09/22   Earna Coder, MD  ondansetron (ZOFRAN) 8 MG tablet One pill every 8 hours as needed for nausea/vomitting. 04/28/21   Earna Coder, MD  pantoprazole (PROTONIX) 40 MG tablet Take 1 tablet (40 mg total) by mouth daily. 03/23/23 06/21/23  Earna Coder, MD  polyethylene glycol (MIRALAX / GLYCOLAX) packet Take 17 g by mouth daily as needed for mild constipation. 04/11/17   Gouru, Deanna Artis, MD  potassium chloride SA (KLOR-CON M) 20 MEQ tablet Take 1 tablet (20 mEq total) by mouth 2 (two) times daily. 02/25/22   Rushie Chestnut, PA-C  spironolactone (ALDACTONE) 25 MG tablet Take 1 tablet by mouth once daily 04/14/22   End, Cristal Deer, MD  traMADol (ULTRAM) 50 MG tablet Take 1 tablet (50 mg total) by mouth every 12 (twelve) hours as needed. 03/23/23   Earna Coder, MD  Vitamin D, Ergocalciferol, (DRISDOL) 1.25 MG (50000 UNIT) CAPS capsule Take 1 capsule (50,000 Units total) by mouth once a week. 03/23/23   Earna Coder, MD     Family History  Problem Relation Age of Onset   Pneumonia Mother    Seizures Father     Social History   Socioeconomic History   Marital status: Single    Spouse name:  Not on file   Number of children: 3   Years of education: Not on file   Highest education level: Not on file  Occupational History   Not on file  Tobacco Use   Smoking status: Former    Current packs/day: 0.00    Types: Cigarettes    Quit date: 03/18/2021    Years since quitting: 2.1   Smokeless tobacco: Never   Tobacco comments:    2 cigarettes QOD  Vaping Use   Vaping status: Never Used  Substance and Sexual Activity   Alcohol use: No   Drug use: No   Sexual activity: Not Currently  Other Topics Concern   Not on file  Social History Narrative   Lives at home with family(son): daughter 8 blocks away. States "I'm never alone." Independent at baseline   Social Determinants of Health   Financial Resource Strain: Not on file  Food Insecurity: No Food Insecurity (09/04/2022)   Hunger Vital Sign    Worried About Running Out of Food  in the Last Year: Never true    Ran Out of Food in the Last Year: Never true  Transportation Needs: No Transportation Needs (09/04/2022)   PRAPARE - Administrator, Civil Service (Medical): No    Lack of Transportation (Non-Medical): No  Physical Activity: Not on file  Stress: Not on file  Social Connections: Not on file    Review of Systems: A 12 point ROS discussed and pertinent positives are indicated in the HPI above.  All other systems are negative.  Review of Systems  Constitutional:  Positive for fatigue. Negative for appetite change.  Respiratory:  Negative for cough and shortness of breath.   Cardiovascular:  Negative for chest pain and leg swelling.  Gastrointestinal:  Negative for abdominal pain, diarrhea, nausea and vomiting.  Genitourinary:  Negative for flank pain.  Musculoskeletal:  Negative for back pain.  Neurological:  Negative for dizziness and headaches.    Vital Signs: BP (!) 159/90   Pulse 67   Temp 98.2 F (36.8 C)   Resp 20   Ht 5\' 2"  (1.575 m)   Wt 168 lb (76.2 kg)   SpO2 97%   BMI 30.73 kg/m    Physical Exam Constitutional:      General: She is not in acute distress.    Appearance: She is not ill-appearing.  HENT:     Mouth/Throat:     Mouth: Mucous membranes are moist.     Pharynx: Oropharynx is clear.  Cardiovascular:     Rate and Rhythm: Normal rate and regular rhythm.     Pulses: Normal pulses.  Pulmonary:     Effort: Pulmonary effort is normal.  Abdominal:     Palpations: Abdomen is soft.     Tenderness: There is no abdominal tenderness.  Musculoskeletal:     Right lower leg: No edema.     Left lower leg: No edema.  Skin:    General: Skin is warm and dry.  Neurological:     Mental Status: She is alert and oriented to person, place, and time.  Psychiatric:        Mood and Affect: Mood normal.        Behavior: Behavior normal.        Thought Content: Thought content normal.        Judgment: Judgment normal.     Imaging: ECHOCARDIOGRAM COMPLETE  Result Date: 05/18/2023    ECHOCARDIOGRAM REPORT   Patient Name:   ELLANORA TANIS Date of Exam: 05/18/2023 Medical Rec #:  [id number]         Height:       62.0 in Accession #:    [id number]        Weight:       175.8 lb Date of Birth:  1950-10-02         BSA:          1.810 m Patient Age:    72 years          BP:           116/64 mmHg Patient Gender: F                 HR:           50 bpm. Exam Location:  [facility] Procedure: 2D Echo, Color Doppler and Cardiac Doppler Indications:     Chemo Z09  History:         Patient has prior history of Echocardiogram examinations, most  recent 03/08/2023. COPD; Risk Factors:Hypertension. Anxiety.  Sonographer:     Cristela Blue Referring Phys:  [id number] Earna Coder Diagnosing Phys: Julien Nordmann MD  Sonographer Comments: Global longitudinal strain was attempted. IMPRESSIONS  1. Left ventricular ejection fraction, by estimation, is 45 to 50%. The left ventricle has mildly decreased function. The left ventricle demonstrates global hypokinesis. Left ventricular diastolic  parameters are consistent with Grade I diastolic dysfunction (impaired relaxation). The average left ventricular global longitudinal strain is -12.3 %.  2. Right ventricular systolic function is normal. The right ventricular size is normal.  3. The mitral valve is normal in structure. Mild mitral valve regurgitation. No evidence of mitral stenosis.  4. The aortic valve is normal in structure. Aortic valve regurgitation is not visualized. Aortic valve sclerosis is present, with no evidence of aortic valve stenosis.  5. The inferior vena cava is normal in size with greater than 50% respiratory variability, suggesting right atrial pressure of 3 mmHg. FINDINGS  Left Ventricle: Left ventricular ejection fraction, by estimation, is 45 to 50%. The left ventricle has mildly decreased function. The left ventricle demonstrates global hypokinesis. The average left ventricular global longitudinal strain is -12.3 %. The left ventricular internal cavity size was normal in size. There is no left ventricular hypertrophy. Left ventricular diastolic parameters are consistent with Grade I diastolic dysfunction (impaired relaxation). Right Ventricle: The right ventricular size is normal. No increase in right ventricular wall thickness. Right ventricular systolic function is normal. Left Atrium: Left atrial size was normal in size. Right Atrium: Right atrial size was normal in size. Pericardium: There is no evidence of pericardial effusion. Mitral Valve: The mitral valve is normal in structure. Mild mitral valve regurgitation. No evidence of mitral valve stenosis. Tricuspid Valve: The tricuspid valve is normal in structure. Tricuspid valve regurgitation is mild . No evidence of tricuspid stenosis. Aortic Valve: The aortic valve is normal in structure. Aortic valve regurgitation is not visualized. Aortic valve sclerosis is present, with no evidence of aortic valve stenosis. Aortic valve mean gradient measures 2.0 mmHg. Aortic valve peak  gradient measures 4.6 mmHg. Aortic valve area, by VTI measures 2.22 cm. Pulmonic Valve: The pulmonic valve was normal in structure. Pulmonic valve regurgitation is not visualized. No evidence of pulmonic stenosis. Aorta: The aortic root is normal in size and structure. Venous: The inferior vena cava is normal in size with greater than 50% respiratory variability, suggesting right atrial pressure of 3 mmHg. IAS/Shunts: No atrial level shunt detected by color flow Doppler.  LEFT VENTRICLE PLAX 2D LVIDd:         3.40 cm   Diastology LVIDs:         2.50 cm   LV e' medial:    3.48 cm/s LV PW:         1.00 cm   LV E/e' medial:  14.6 LV IVS:        1.30 cm   LV e' lateral:   5.00 cm/s LVOT diam:     2.00 cm   LV E/e' lateral: 10.2 LV SV:         35 LV SV Index:   19        2D Longitudinal Strain LVOT Area:     3.14 cm  2D Strain GLS Avg:     -12.3 %  RIGHT VENTRICLE RV Basal diam:  1.80 cm RV Mid diam:    1.70 cm LEFT ATRIUM  Index        RIGHT ATRIUM           Index LA diam:        2.70 cm 1.49 cm/m   RA Area:     10.10 cm LA Vol (A2C):   39.9 ml 22.05 ml/m  RA Volume:   19.70 ml  10.89 ml/m LA Vol (A4C):   18.6 ml 10.28 ml/m LA Biplane Vol: 27.5 ml 15.20 ml/m  AORTIC VALVE AV Area (Vmax):    1.95 cm AV Area (Vmean):   1.86 cm AV Area (VTI):     2.22 cm AV Vmax:           107.00 cm/s AV Vmean:          72.800 cm/s AV VTI:            0.157 m AV Peak Grad:      4.6 mmHg AV Mean Grad:      2.0 mmHg LVOT Vmax:         66.30 cm/s LVOT Vmean:        43.000 cm/s LVOT VTI:          0.111 m LVOT/AV VTI ratio: 0.71  AORTA Ao Root diam: 2.90 cm MITRAL VALVE MV Area (PHT): 2.74 cm    SHUNTS MV Decel Time: 277 msec    Systemic VTI:  0.11 m MV E velocity: 50.80 cm/s  Systemic Diam: 2.00 cm MV A velocity: 84.90 cm/s MV E/A ratio:  0.60 Julien Nordmann MD Electronically signed by Julien Nordmann MD Signature Date/Time: 05/18/2023/11:23:04 AM    Final     Labs:  CBC: Recent Labs    04/05/23 0817 04/19/23 0917  05/03/23 1022 05/11/23 0812  WBC 3.2* 2.9* 3.7* 3.2*  HGB 9.5* 9.9* 9.6* 9.8*  HCT 30.6* 32.1* 31.0* 31.1*  PLT 73* 246 240 101*    COAGS: No results for input(s): "INR", "APTT" in the last 8760 hours.  BMP: Recent Labs    04/05/23 0817 04/19/23 0917 05/03/23 1022 05/11/23 0812  NA 139 139 137 137  K 3.3* 3.6 3.7 3.9  CL 111 110 109 109  CO2 20* 22 21* 22  GLUCOSE 99 96 94 101*  BUN 19 15 13 16   CALCIUM 7.8* 8.5* 8.1* 8.1*  CREATININE 0.80 0.89 0.84 0.81  GFRNONAA >60 >60 >60 >60    LIVER FUNCTION TESTS: Recent Labs    04/05/23 0817 04/19/23 0917 05/03/23 1022 05/11/23 0812  BILITOT 0.4 0.6 0.6 0.6  AST 15 16 14* 13*  ALT 23 11 9 10   ALKPHOS 72 67 67 52  PROT 6.0* 7.2 6.8 7.2  ALBUMIN 3.6 4.1 3.9 3.9    TUMOR MARKERS: No results for input(s): "AFPTM", "CEA", "CA199", "CHROMGRNA" in the last 8760 hours.  Assessment and Plan:  Multiple myeloma: Brandi [name], 72 year old female, presents today to the [facility] Interventional Radiology department for an image-guided bone marrow biopsy with aspiration.   Risks and benefits of this procedure were discussed with the patient and/or patient's family including, but not limited to bleeding, infection, damage to adjacent structures or low yield requiring additional tests.  All of the questions were answered and there is agreement to proceed. She has been NPO. She is a full code.   Consent signed and in chart.  Thank you for this interesting consult.  I greatly enjoyed meeting Brandi [name] and look forward to participating in their care.  A copy of this report  was sent to the requesting provider on this date.  Electronically Signed: [name], AGACNP-BC [phone] 05/25/2023, 8:26 AM   I spent a total of  30 Minutes   in face to face in clinical consultation, greater than 50% of which was counseling/coordinating care for bone marrow biopsy with aspiration.

## 2023-05-25 ENCOUNTER — Ambulatory Visit
Admission: RE | Admit: 2023-05-25 | Discharge: 2023-05-25 | Disposition: A | Discharge status: Home / Self Care | Payer: 59 | Source: Ambulatory Visit | Attending: Internal Medicine | Admitting: Internal Medicine

## 2023-05-25 DIAGNOSIS — I110 Hypertensive heart disease with heart failure: Secondary | ICD-10-CM | POA: Insufficient documentation

## 2023-05-25 DIAGNOSIS — J449 Chronic obstructive pulmonary disease, unspecified: Secondary | ICD-10-CM | POA: Diagnosis not present

## 2023-05-25 DIAGNOSIS — D649 Anemia, unspecified: Secondary | ICD-10-CM | POA: Insufficient documentation

## 2023-05-25 DIAGNOSIS — I5032 Chronic diastolic (congestive) heart failure: Secondary | ICD-10-CM | POA: Insufficient documentation

## 2023-05-25 DIAGNOSIS — D72819 Decreased white blood cell count, unspecified: Secondary | ICD-10-CM | POA: Diagnosis not present

## 2023-05-25 DIAGNOSIS — Z1379 Encounter for other screening for genetic and chromosomal anomalies: Secondary | ICD-10-CM | POA: Diagnosis not present

## 2023-05-25 DIAGNOSIS — F419 Anxiety disorder, unspecified: Secondary | ICD-10-CM | POA: Diagnosis not present

## 2023-05-25 DIAGNOSIS — Z87891 Personal history of nicotine dependence: Secondary | ICD-10-CM | POA: Diagnosis not present

## 2023-05-25 DIAGNOSIS — C9000 Multiple myeloma not having achieved remission: Secondary | ICD-10-CM | POA: Diagnosis present

## 2023-05-25 DIAGNOSIS — Z9481 Bone marrow transplant status: Secondary | ICD-10-CM | POA: Insufficient documentation

## 2023-05-25 HISTORY — PX: IR BONE MARROW BIOPSY & ASPIRATION: IMG5727

## 2023-05-25 LAB — CBC WITH DIFFERENTIAL/PLATELET
Abs Immature Granulocytes: 0.02 10*3/uL (ref 0.00–0.07)
Basophils Absolute: 0 10*3/uL (ref 0.0–0.1)
Basophils Relative: 1 %
Eosinophils Absolute: 0.1 10*3/uL (ref 0.0–0.5)
Eosinophils Relative: 4 %
HCT: 33.9 % — ABNORMAL LOW (ref 36.0–46.0)
Hemoglobin: 10.6 g/dL — ABNORMAL LOW (ref 12.0–15.0)
Immature Granulocytes: 1 %
Lymphocytes Relative: 34 %
Lymphs Abs: 1.2 10*3/uL (ref 0.7–4.0)
MCH: 29 pg (ref 26.0–34.0)
MCHC: 31.3 g/dL (ref 30.0–36.0)
MCV: 92.9 fL (ref 80.0–100.0)
Monocytes Absolute: 0.5 10*3/uL (ref 0.1–1.0)
Monocytes Relative: 13 %
Neutro Abs: 1.6 10*3/uL — ABNORMAL LOW (ref 1.7–7.7)
Neutrophils Relative %: 47 %
Platelets: 274 10*3/uL (ref 150–400)
RBC: 3.65 MIL/uL — ABNORMAL LOW (ref 3.87–5.11)
RDW: 14.9 % (ref 11.5–15.5)
WBC: 3.5 10*3/uL — ABNORMAL LOW (ref 4.0–10.5)
nRBC: 0 % (ref 0.0–0.2)

## 2023-05-25 MED ORDER — LIDOCAINE 1 % OPTIME INJ - NO CHARGE
8.0000 mL | Freq: Once | INTRAMUSCULAR | Status: AC
  Administered 2023-05-25: 8 mL via INTRADERMAL
  Filled 2023-05-25: qty 8

## 2023-05-25 MED ORDER — MIDAZOLAM HCL 2 MG/2ML IJ SOLN
INTRAMUSCULAR | Status: AC
  Filled 2023-05-25: qty 2

## 2023-05-25 MED ORDER — HEPARIN SOD (PORK) LOCK FLUSH 100 UNIT/ML IV SOLN
INTRAVENOUS | Status: AC
  Filled 2023-05-25: qty 5

## 2023-05-25 MED ORDER — FENTANYL CITRATE (PF) 100 MCG/2ML IJ SOLN
INTRAMUSCULAR | Status: AC | PRN
  Administered 2023-05-25: 50 ug via INTRAVENOUS

## 2023-05-25 MED ORDER — FENTANYL CITRATE (PF) 100 MCG/2ML IJ SOLN
INTRAMUSCULAR | Status: AC
  Filled 2023-05-25: qty 2

## 2023-05-25 MED ORDER — MIDAZOLAM HCL 5 MG/5ML IJ SOLN
INTRAMUSCULAR | Status: AC | PRN
Start: 2023-05-25 — End: 2023-05-25
  Administered 2023-05-25: 1 mg via INTRAVENOUS

## 2023-05-25 MED ORDER — SODIUM CHLORIDE 0.9 % IV SOLN: INTRAVENOUS | Status: DC

## 2023-05-25 NOTE — Progress Notes (Signed)
Patient clinically stable post IR BMB per Dr Juliette Alcide, tolerated. Vitals stable pre and post procedure. Received Versed 1 mg along with Fentanyl 50 mcg IV for procedure. Denies complaints post procedure. Report given to [facility] RN post procedure/specials/17

## 2023-06-02 LAB — SURGICAL PATHOLOGY

## 2023-06-03 ENCOUNTER — Encounter (HOSPITAL_COMMUNITY): Payer: Self-pay

## 2023-06-06 ENCOUNTER — Encounter (HOSPITAL_COMMUNITY): Payer: Self-pay

## 2023-06-10 ENCOUNTER — Other Ambulatory Visit: Payer: Self-pay

## 2023-06-10 ENCOUNTER — Encounter: Payer: Self-pay | Admitting: Nurse Practitioner

## 2023-06-10 ENCOUNTER — Inpatient Hospital Stay: Payer: 59 | Attending: Nurse Practitioner

## 2023-06-10 ENCOUNTER — Inpatient Hospital Stay (HOSPITAL_BASED_OUTPATIENT_CLINIC_OR_DEPARTMENT_OTHER): Payer: 59 | Admitting: Nurse Practitioner

## 2023-06-10 VITALS — BP 140/64 | HR 80 | Temp 98.9°F | Wt 167.0 lb

## 2023-06-10 DIAGNOSIS — E876 Hypokalemia: Secondary | ICD-10-CM | POA: Insufficient documentation

## 2023-06-10 DIAGNOSIS — C9000 Multiple myeloma not having achieved remission: Secondary | ICD-10-CM | POA: Diagnosis present

## 2023-06-10 LAB — CBC WITH DIFFERENTIAL (CANCER CENTER ONLY)
Abs Immature Granulocytes: 0.02 10*3/uL (ref 0.00–0.07)
Basophils Absolute: 0 10*3/uL (ref 0.0–0.1)
Basophils Relative: 0 %
Eosinophils Absolute: 0.1 10*3/uL (ref 0.0–0.5)
Eosinophils Relative: 2 %
HCT: 30.2 % — ABNORMAL LOW (ref 36.0–46.0)
Hemoglobin: 9.1 g/dL — ABNORMAL LOW (ref 12.0–15.0)
Immature Granulocytes: 0 %
Lymphocytes Relative: 29 %
Lymphs Abs: 1.5 10*3/uL (ref 0.7–4.0)
MCH: 27.3 pg (ref 26.0–34.0)
MCHC: 30.1 g/dL (ref 30.0–36.0)
MCV: 90.7 fL (ref 80.0–100.0)
Monocytes Absolute: 0.3 10*3/uL (ref 0.1–1.0)
Monocytes Relative: 6 %
Neutro Abs: 3.2 10*3/uL (ref 1.7–7.7)
Neutrophils Relative %: 63 %
Platelet Count: 338 10*3/uL (ref 150–400)
RBC: 3.33 MIL/uL — ABNORMAL LOW (ref 3.87–5.11)
RDW: 15.6 % — ABNORMAL HIGH (ref 11.5–15.5)
WBC Count: 5.2 10*3/uL (ref 4.0–10.5)
nRBC: 0 % (ref 0.0–0.2)

## 2023-06-10 LAB — BASIC METABOLIC PANEL
Anion gap: 10 (ref 5–15)
BUN: 9 mg/dL (ref 8–23)
CO2: 22 mmol/L (ref 22–32)
Calcium: 8.3 mg/dL — ABNORMAL LOW (ref 8.9–10.3)
Chloride: 108 mmol/L (ref 98–111)
Creatinine, Ser: 0.66 mg/dL (ref 0.44–1.00)
GFR, Estimated: >60 mL/min (ref >60)
Glucose, Bld: 99 mg/dL (ref 70–99)
Potassium: 3.2 mmol/L — ABNORMAL LOW (ref 3.5–5.1)
Sodium: 140 mmol/L (ref 135–145)

## 2023-06-10 LAB — IRON AND TIBC
Iron: 34 ug/dL (ref 28–170)
Saturation Ratios: 9 % — ABNORMAL LOW (ref 10.4–31.8)
TIBC: 391 ug/dL (ref 250–450)
UIBC: 357 ug/dL

## 2023-06-10 LAB — FERRITIN: Ferritin: 149 ng/mL (ref 11–307)

## 2023-06-10 MED ORDER — VITAMIN D (ERGOCALCIFEROL) 1.25 MG (50000 UNIT) PO CAPS: 50000.0000 [IU] | ORAL_CAPSULE | ORAL | 0 refills | Status: DC

## 2023-06-10 MED ORDER — HEPARIN SOD (PORK) LOCK FLUSH 100 UNIT/ML IV SOLN
500.0000 [IU] | Freq: Once | INTRAVENOUS | Status: AC | PRN
  Administered 2023-06-10: 500 [IU]
  Filled 2023-06-10: qty 5

## 2023-06-10 MED ORDER — CALCIUM CARBONATE 600 MG PO TABS: 1200.0000 mg | ORAL_TABLET | Freq: Two times a day (BID) | ORAL | 1 refills | Status: DC

## 2023-06-10 MED ORDER — SODIUM CHLORIDE 0.9% FLUSH
10.0000 mL | Freq: Once | INTRAVENOUS | Status: AC | PRN
  Administered 2023-06-10: 10 mL
  Filled 2023-06-10: qty 10

## 2023-06-10 NOTE — Progress Notes (Signed)
[facility] OFFICE PROGRESS NOTE  Patient Care Team: Leanna Sato, MD as PCP - General (Family Medicine) End, Cristal Deer, MD as PCP - Cardiology (Cardiology) Eddie Candle, [name] Earna Coder, MD as Consulting Physician (Hematology and Oncology) Vida Rigger, MD as Consulting Physician (Pulmonary Disease) Tedd Sias Marlana Salvage, MD as Consulting Physician (Endocrinology)   Cancer Staging  No matching staging information was found for the patient.   Oncology History Overview Note  # SEP 2018- MULTIPLE MYELOMA IgALamda [2.5 gm/dl; K/L= 93/2355]; STAGE III [beta 2 microglobulin=5.5] [presented with acute renal failure; anemia; NO hypercalcemia; Skeletal survey-Normal]; BMBx- 45% plasma cells; FISH-POSITIVE 11:14 translocation.[STANDARD-high RISK]/cyto-Normal; SEP 2018- PET- L3 posterior element lesion.   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 No morphologic support for residual myeloma disease. Negative for minimal residual disease by MM-MRD flow cytometry; HOLD REVLIMID [leg swelling]; AUG 2021-PET scan negative for myeloma; continue to hold Revlimid  # MARCH 2021-diastolic congestive heart failure [Dr.End]  # BMBx- OCT 2021- BMBx- 5% plasmacytosis-however this appears to be more polyclonal rather than monoclonal-not explain patient worsening anemia.   # OCT 2022- 18th- Dara- Rev-Dex; MARCH, 2024- PROGRESSION bone marrow biopsy  70% plasma cells; PET scan no bone lesions-however conus medullaris uptake-MRI lumbar spine-   # MAY 2024- DISCONTINUE carfilzomib-dexamethasone- Pomalyst; [rising M-protein; MAY 2024- 1.3. ]   # start 12/27/2022 -Isa-Carfil-Dex;  [decrease dexamethasone 20 weekly- starting 05/04/23- sec to glaucoma].  #November 2021-hyperthyroidism/goiter- [D.Bennett/Dr.Solum]-methimazole. S/p Thyroidectomy [Dr.Kim; [facility]-AUG 2022]  --------------------------  # 12/12- RIGHT JUGULAR DVT-x 3m on xarelto; finished April 2020; September 2020-EGD/dysphagia; Dr. Tobi Bastos  # Acute renal failure [Dr.Singh; Proteinuria 1.5gm/day ]; acyclovir/Asprin ------------------------------------------------------------------------------------------------------------   DIAGNOSIS: [ ]  MULTIPLE MYELOMA  STAGE: III/HIGH RISK ;GOALS: CONTROL      Multiple myeloma not having achieved remission (HCC)  11/21/2017 - 04/28/2018 Chemotherapy   Patient is on Treatment Plan : MYELOMA SALVAGE Cyclophosphamide / Carfilzomib / Dexamethasone (CCd) q28d     05/12/2021 - 02/25/2022 Chemotherapy   Patient is on Treatment Plan : MYELOMA RELAPSED REFRACTORY Daratumumab SQ + Lenalidomide + Dexamethasone (DaraRd) q28d     05/12/2021 - 07/23/2022 Chemotherapy   Patient is on Treatment Plan : MYELOMA RELAPSED REFRACTORY Daratumumab SQ + Lenalidomide + Dexamethasone (DaraRd) q28d     11/02/2022 - 11/16/2022 Chemotherapy   Patient is on Treatment Plan : MYELOMA RELAPSED/ REFRACTORY Carfilzomib D1,8,15 (20/27) + Pomalidomide + Dexamethasone (KPd) q28d     12/27/2022 -  Chemotherapy   Patient is on Treatment Plan : MYELOMA Carfilzomib + Dexamethasone + Isatuximab q28d      INTERVAL HISTORY: Ambulating  independently.   Alone.  Brandi [name] 72 y.o.  female pleasant patient above history of relapsed multiple myeloma--currently  Isa- Carfil-Dex, underwent bone marrow on 05/25/23, who returns to clinic for follow up. She is feeling somewhat stronger since  being on her chemotherapy holiday. She has not seen results of bone marrow. Weight is down but she feels that appetite has improved and she's pleased that her weight is up  overall. She has chronic pain that isn't any worse. Continues eye drops for glaucoma. Vision is unchanged and she denies pain.   Review of Systems  Constitutional:  Positive for malaise/fatigue. Negative for chills, diaphoresis and fever.  HENT:  Negative for nosebleeds and sore throat.   Eyes:  Negative for double vision.  Respiratory:  Negative for hemoptysis.   Cardiovascular:  Negative for chest pain, palpitations and orthopnea.  Gastrointestinal:  Negative for abdominal pain, blood in stool, constipation, heartburn and melena.  Genitourinary:  Negative for dysuria, flank pain, frequency, hematuria and urgency.  Musculoskeletal:  Positive for joint pain. Negative for back pain, falls and myalgias.  Skin: Negative.  Negative for itching and rash.  Neurological:  Negative for dizziness, tingling, focal weakness, weakness and headaches.  Endo/Heme/Allergies:  Does not bruise/bleed easily.  Psychiatric/Behavioral:  Negative for depression. The patient is not nervous/anxious.    PAST MEDICAL HISTORY :  Past Medical History:  Diagnosis Date   (HFpEF) heart failure with preserved ejection fraction (HCC)    a. 06/2020 Echo: EF 60-65%, no rwma, nl RV size/fxn. Triv MR. Triv TR/AI. Mod elev PASP.   Anxiety    COPD (chronic obstructive pulmonary disease) (HCC)    GERD (gastroesophageal reflux disease)    Hypertension    Hyperthyroidism    Hypokalemia    IgA myeloma (HCC)    Multiple myeloma (HCC)    Pneumonia    Red blood cell antibody positive, compatible PRBC difficult to obtain    S/P autologous bone marrow transplantation (HCC)     PAST SURGICAL HISTORY :   Past Surgical History:  Procedure Laterality Date   ANTERIOR VITRECTOMY Left 10/07/2021   Procedure: ANTERIOR VITRECTOMY;  Surgeon: Lockie Mola, MD;   Location: [facility];  Service: Ophthalmology;  Laterality: Left;   CATARACT EXTRACTION W/PHACO Left 10/07/2021   Procedure: CATARACT EXTRACTION PHACO AND INTRAOCULAR LENS PLACEMENT (IOC) LEFT VISION BLUE;  Surgeon: Lockie Mola, MD;  Location: [facility];  Service: Ophthalmology;  Laterality: Left;  kit for manual incision 14.56 01:21.4   CATARACT EXTRACTION W/PHACO Right 10/21/2021   Procedure: CATARACT EXTRACTION PHACO AND INTRAOCULAR LENS PLACEMENT (IOC) RIGHT 11.12 01:48.1;  Surgeon: Lockie Mola, MD;  Location: [facility];  Service: Ophthalmology;  Laterality: Right;   ESOPHAGOGASTRODUODENOSCOPY (EGD) WITH PROPOFOL N/A 03/30/2019   Procedure: ESOPHAGOGASTRODUODENOSCOPY (EGD) WITH PROPOFOL;  Surgeon: Wyline Mood, MD;  Location: [facility];  Service: Gastroenterology;  Laterality: N/A;   IR BONE MARROW BIOPSY & ASPIRATION  05/25/2023   IR FLUORO GUIDE PORT INSERTION RIGHT  12/16/2017   IR IMAGING GUIDED PORT INSERTION  06/15/2021   KNEE ARTHROSCOPY Left 09/03/2022   Procedure: ARTHROSCOPY KNEE DEBRIDEMENT;  Surgeon: Lyndle Herrlich, MD;  Location: [facility];  Service: Orthopedics;  Laterality: Left;   THYROIDECTOMY N/A     FAMILY HISTORY :   Family History  Problem Relation Age of Onset   Pneumonia Mother    Seizures Father     SOCIAL HISTORY:   Social History   Tobacco Use   Smoking status: Former    Current packs/day: 0.00    Types: Cigarettes    Quit date: 03/18/2021    Years since quitting: 2.2   Smokeless tobacco: Never   Tobacco comments:    2 cigarettes QOD  Vaping Use   Vaping status: Never Used  Substance Use Topics   Alcohol use: No   Drug use: No    ALLERGIES:  has No Known Allergies.  MEDICATIONS:  Current Outpatient Medications  Medication Sig Dispense Refill   acyclovir (ZOVIRAX) 400 MG tablet Take 1 tablet by mouth twice daily 60 tablet 0   albuterol (VENTOLIN HFA) 108 (90 Base) MCG/ACT inhaler Inhale 2 puffs  into the lungs every 6 (six) hours as needed for wheezing or shortness of breath. 8 g 3   aspirin EC 81 MG EC tablet Take 1 tablet (81 mg total) by mouth daily. 90 tablet 3   brimonidine (ALPHAGAN P) 0.1 % SOLN Place 1 drop into both eyes 2 (two) times daily.     Brinzolamide-Brimonidine (SIMBRINZA) 1-0.2 % SUSP Apply 1 drop to eye in the morning and at bedtime. I drop both eyes twice a day     calcium-vitamin D (OSCAL WITH D) 500MG -200UNIT ( ) tablet Take 1 tablet by mouth 2 (two) times daily. 60 tablet 0   carvedilol (COREG) 3.125 MG tablet Take 1 tablet (3.125 mg total) by mouth 2 (two) times daily with a meal. 60 tablet 5   furosemide (LASIX) 40 MG tablet Take 1 tablet (40 mg total) by mouth daily. 90 tablet 3   Latanoprostene Bunod (VYZULTA) 0.024 % SOLN Apply 1 drop to eye at bedtime. 1 drop Left eye every night     levothyroxine (SYNTHROID) 150 MCG tablet Take 1 tablet (150 mcg total) by mouth daily before breakfast. Take before eating or drinking anything. DO Not  eat or drink until after an hour later after taking medication. 30 tablet 3   lidocaine-prilocaine (EMLA) cream Apply 1 application topically as needed. Apply to port and cover with saran wrap 1-2 hours prior to port access 30 g 1   magnesium oxide (MAG-OX) 400 (240 Mg) MG tablet Take 1 tablet (400 mg total) by mouth 2 (two) times daily. 60 tablet 3   montelukast (SINGULAIR) 10 MG tablet Take 1 tablet (10 mg total) by mouth at bedtime. 30 tablet 3   ondansetron (ZOFRAN) 8 MG tablet One pill every 8 hours as needed for nausea/vomitting. 40 tablet 1   pantoprazole (PROTONIX) 40 MG tablet Take 1 tablet (40 mg total) by mouth daily. 90 tablet 0   polyethylene glycol (MIRALAX / GLYCOLAX) packet Take 17 g by mouth daily as needed for mild constipation. 14 each 0   potassium chloride SA (KLOR-CON M) 20 MEQ tablet Take 1 tablet (20 mEq total) by mouth 2 (two) times daily. 60 tablet 6   spironolactone (ALDACTONE) 25 MG tablet Take 1  tablet by mouth once daily 90 tablet 3   traMADol (ULTRAM) 50 MG tablet Take 1 tablet (50 mg total) by mouth every 12 (twelve) hours as needed. 60 tablet 0   Vitamin D, Ergocalciferol, (DRISDOL) 1.25 MG (50000 UNIT) CAPS capsule Take 1 capsule (50,000 Units total) by mouth once a week. 12 capsule 0   No current facility-administered medications for this visit.   Facility-Administered Medications Ordered in Other Visits  Medication Dose Route Frequency Provider Last Rate Last Admin   0.9 %  sodium chloride infusion   Intravenous Continuous Ralene Muskrat, PA-C        PHYSICAL EXAMINATION: BP (!) 140/64 (BP Location: Right Arm, Patient Position: Sitting)   Pulse 80   Temp 98.9 F (37.2 C) (Tympanic)   Wt 167 lb (75.8 kg)   SpO2 100%   BMI 30.54 kg/m   Filed Weights   06/10/23 1017  Weight: 167 lb (75.8 kg)   Physical Exam Vitals reviewed.  Constitutional:      Appearance: She is not  ill-appearing.     Comments: unaccompanied  HENT:     Head: Normocephalic and atraumatic.  Cardiovascular:     Rate and Rhythm: Normal rate and regular rhythm.  Pulmonary:     Effort: Pulmonary effort is normal. No respiratory distress.     Breath sounds: No wheezing.  Abdominal:     General: There is no distension.     Palpations: Abdomen is soft.     Tenderness: There is no abdominal tenderness. There is no guarding or rebound.  Musculoskeletal:        General: No tenderness. Normal range of motion.     Cervical back: No bony tenderness.     Thoracic back: No bony tenderness.     Lumbar back: No bony tenderness.     Comments: ambulating  Lymphadenopathy:     Cervical: No cervical adenopathy.  Skin:    General: Skin is warm.     Coloration: Skin is not pale.  Neurological:     Mental Status: She is alert and oriented to person, place, and time.  Psychiatric:        Mood and Affect: Mood and affect normal.        Behavior: Behavior normal.    LABORATORY DATA:  I have reviewed the  data as listed    Component Value Date/Time   NA 140 06/10/2023 0955   NA 141 01/25/2020 1530   K 3.2 (L) 06/10/2023 0955   CL 108 06/10/2023 0955   CO2 22 06/10/2023 0955   GLUCOSE 99 06/10/2023 0955   BUN 9 06/10/2023 0955   BUN 19 01/25/2020 1530   CREATININE 0.66 06/10/2023 0955   CREATININE 0.81 05/11/2023 0812   CALCIUM 8.3 (L) 06/10/2023 0955   PROT 7.2 05/11/2023 0812   ALBUMIN 3.9 05/11/2023 0812   AST 13 (L) 05/11/2023 0812   ALT 10 05/11/2023 0812   ALKPHOS 52 05/11/2023 0812   BILITOT 0.6 05/11/2023 0812   GFRNONAA >60 06/10/2023 0955   GFRNONAA >60 05/11/2023 0812   GFRAA NOT CALCULATED 04/17/2020 1352   Lab Results  Component Value Date   WBC 5.2 06/10/2023   NEUTROABS 3.2 06/10/2023   HGB 9.1 (L) 06/10/2023   HCT 30.2 (L) 06/10/2023   MCV 90.7 06/10/2023   PLT 338 06/10/2023      Chemistry      Component Value Date/Time   NA 140 06/10/2023 0955   NA 141 01/25/2020 1530   K 3.2 (L) 06/10/2023 0955   CL 108 06/10/2023 0955   CO2 22 06/10/2023 0955   BUN 9 06/10/2023 0955   BUN 19 01/25/2020 1530   CREATININE 0.66 06/10/2023 0955   CREATININE 0.81 05/11/2023 0812      Component Value Date/Time   CALCIUM 8.1 (L) 05/11/2023 0812   ALKPHOS 52 05/11/2023 0812   AST 13 (L) 05/11/2023 0812   ALT 10 05/11/2023 0812   BILITOT 0.6 05/11/2023 0812     05/03/23- SPEP&IFE Component Ref Range & Units 1 mo ago (05/03/23) 1 mo ago (04/19/23) 2 mo ago (03/30/23) 3 mo ago (03/09/23) 4 mo ago (02/02/23) 4 mo ago (01/17/23) 5 mo ago (12/27/22)  IgG (Immunoglobin G), Serum 586 - 1,602 mg/dL 409 8,119 High  147 Low  373 Low  481 Low  503 Low  613  IgA 64 - 422 mg/dL 32 Low  88 33 Low  CM 30 Low  CM 153 331 R 1,433 High  R, CM  Comment: Result confirmed on concentration.  IgM (Immunoglobulin M), Srm 26 - 217 mg/dL <5 Low  44 <5 Low  CM <5 Low  CM 6 Low  CM <5 Low  CM 7 Low  CM  Comment: Result confirmed on concentration.  Total Protein ELP 6.0 - 8.5 g/dL 5.5 Low   VC 6.5 VC 5.9 Low  VC 5.3 Low  VC 6.1 VC 6.3 VC 7.7 VC  Albumin SerPl Elph-Mcnc 2.9 - 4.4 g/dL 3.3 VC 2.8 Low  VC 3.4 VC 3.5 VC 3.5 VC 3.7 VC 3.3 VC  Alpha 1 0.0 - 0.4 g/dL 0.1 VC 0.4 VC 0.2 VC 0.1 VC 0.1 VC 0.1 VC 0.1 VC  Alpha2 Glob SerPl Elph-Mcnc 0.4 - 1.0 g/dL 0.5 VC 0.8 VC 0.7 VC 0.4 VC 0.7 VC 0.6 VC 0.7 VC  B-Globulin SerPl Elph-Mcnc 0.7 - 1.3 g/dL 1.0 VC 0.9 VC 1.2 VC 1.0 VC 1.3 VC 1.3 VC 1.5 High  VC  Gamma Glob SerPl Elph-Mcnc 0.4 - 1.8 g/dL 0.5 VC 1.7 VC 0.3 Low  VC 0.3 Low  VC 0.4 VC 0.6 VC 2.0 High  VC  M Protein SerPl Elph-Mcnc Not Observed g/dL Not Observed VC 1.6 High  VC 0.1 High  VC 0.1 High  VC 0.4 High  VC, CM 0.2 High  VC, CM 1.5 High  VC  Globulin, Total 2.2 - 3.9 g/dL 2.2 VC 3.7 VC 2.5 VC 1.8 Low  VC 2.6 VC 2.6 VC 4.4 High  VC  Albumin/Glob SerPl 0.7 - 1.7 1.6 VC 0.8 VC 1.4 VC 2.0 High  VC 1.4 VC 1.5 VC 0.8 VC  IFE 1 Comment Abnormal  VC Comment Abnormal  VC, CM Comment Abnormal  VC, CM Comment Abnormal  VC, CM Comment Abnormal  VC, CM Comment Abnormal  VC, CM Comment Abnormal  VC, CM  Comment: (NOTE) Immunofixation shows IgA monoclonal protein with lambda light chain specificity. Monoclonal bands detected are faint. Suggest retesting in 4-6 months.   Component Ref Range & Units 1 mo ago (05/03/23) 1 mo ago (04/19/23) 2 mo ago (03/30/23) 2 mo ago (03/23/23) 4 mo ago (02/02/23) 4 mo ago (01/17/23) 5 mo ago (12/27/22)  Kappa free light chain 3.3 - 19.4 mg/L 3.5 1.9 Low  3.0 Low  1.6 Low  1.3 Low  1.3 Low  3.2 Low   Lambda free light chains 5.7 - 26.3 mg/L 6.7 7.2 5.9 7.4 10.6 18.7 307.6 High   Kappa, lambda light chain ratio 0.26 - 1.65 0.52 0.26 CM 0.51 CM 0.22 Low  CM 0.12 Low  CM 0.07 Low  CM 0.01 Low     05/25/23- Bone Marrow Report DIAGNOSIS:   BONE MARROW, ASPIRATE, CLOT, CORE:  -Very focal minimal residual lambda restricted plasma cells consistent with recurrent/residual known plasma cell myeloma   PERIPHERAL BLOOD:  -Normochromic normocytic anemia   -Borderline leukopenia   Note: The patient's history of plasma cell myeloma is noted.  There is very focal clusters of lambda restricted plasma cells seen only on areas of the biopsy.  Increased plasma cells are not identified on aspirate smear slides.  In addition, the plasma cells on the clot section are all  scattered polyclonal plasma cells.  The findings are consistent with focal minimal residual lambda restricted plasma cell neoplasm.   RADIOGRAPHIC STUDIES: I have personally reviewed the radiological images as listed and agreed with the findings in the report. No results found.   ASSESSMENT & PLAN:   Multiple myeloma not having achieved remission (HCC) # RECURRENT Multiple myeloma stage  III [high-risk cytogenetics-status post KRD-VGPR ; status post autologous stem cell transplant on 06/15/18.  Most recently progressed on salvage therapy with daratumumab  lenalidomide [15 mg 3 w; 1-w] dexamethasone. plasma cell myeloma by approximately 70% of the overall cellular marrow;  FEB 2024-lambda light chain 284 Bone marrow-70% plasma cells. M Protein 0.9-rising lambda light chain ratio. Discontinue Revlimid; and dara-given the progressive disease.  MARCH 2024- PET scan: No skeletal lesions noted. APRIl 2024- MRI-cervical/thoracic/lumbar spine-negative for any lesions in the spinal cord. DISCONTINUEPomalyst;- carfilzomib-dexamethasone- [rising M-protein; MAY 2024- 1.3] start 12/27/2022-Isa-Carfil-Dex [[facility]]. 12/27/2022-Isa-Dex; d-2 carfil weekly q 28 days  [[facility]] MARCH 2024-Echo [[facility]] Left ventricular EF 50 to 55%; AUG 13th, 2024- EF-45-50%.   # 05/18/23- Echo- LVEF 45-50%. Stable. OCT 2024- myeloma panel kappa lambda light chain today= WNL. On treatment holiday d/t glaucoma and assess response. Bone marrow biopsy from 05/25/23 shows continued improvement in disease. Plan to continue treatment holiday and will have her follow up with Dr Donneta Romberg with labs, no treatment, after Thanksgiving. Patient in  agreement.   # Electrolyte:  Hypokalemia: mild- Continue Kdur TID hypocalcemia- 8.3- vit D SEP 2024- 50-  Continue Ergo 50k weekly. Restart Calcium 1200 mg twice a day.     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 Take pills on empty stomach. TSH September 2024 was 11- improved from 24 in August 2024. Repeat at next visit. Take medication daily on empty stomach.    # Glaucoma BIL- L > R [Dr. Brooke Dare; [facility]] on eye drops. Followed by ophthalmology. Vision changes are stable.    # Lower back discomfort x 6 months-see above.  MRI lumbar spine as above.  Continue tramadol. stable   # Bone lesions /last zometa on 11/27/2017. S/p Dental extraction PET scan evidence of any bone lesions.Hold Zometa for now.stable   # Infection prophylaxis: Acyclovir; asprin- add IVIG infusion [IgG < 400]- plan  IVIG- q 4 w #1 on- 9/17- stable   # IV access: mediport- functional   # Vaccination: Flu shot [sep 2024] s/p Pneumonia vaccination [march 2024]- stable.    6 w- given dental extrac- on 1/26. :MM panel; K/l light chain ratio q 4 W   D-isa-dex;car1&2-car; weekly x 3; week 4-OFF;  q 28 day cycle; NO zometa- dental issues-ok; but hypocalemia   # DISPOSITION: 3-4 weeks- Labs (cbc, bmp, MM Panel, K/L Light chains, TSH), Dr Donneta Romberg, No chemo- la  No problem-specific Assessment & Plan notes found for this encounter.  No orders of the defined types were placed in this encounter.  All questions were answered. The patient knows to call the clinic with any problems, questions or concerns.     Alinda Dooms, NP 06/10/2023  End

## 2023-06-11 LAB — KAPPA/LAMBDA LIGHT CHAINS
Kappa free light chain: 2.2 mg/L — ABNORMAL LOW (ref 3.3–19.4)
Kappa, lambda light chain ratio: 0.22 — ABNORMAL LOW (ref 0.26–1.65)
Lambda free light chains: 10.2 mg/L (ref 5.7–26.3)

## 2023-06-12 ENCOUNTER — Other Ambulatory Visit: Payer: Self-pay

## 2023-06-15 ENCOUNTER — Encounter: Payer: Self-pay | Admitting: Internal Medicine

## 2023-06-16 LAB — MULTIPLE MYELOMA PANEL, SERUM
Albumin SerPl Elph-Mcnc: 4 g/dL (ref 2.9–4.4)
Albumin/Glob SerPl: 1.3 (ref 0.7–1.7)
Alpha 1: 0.3 g/dL (ref 0.0–0.4)
Alpha2 Glob SerPl Elph-Mcnc: 1 g/dL (ref 0.4–1.0)
B-Globulin SerPl Elph-Mcnc: 1.4 g/dL — ABNORMAL HIGH (ref 0.7–1.3)
Gamma Glob SerPl Elph-Mcnc: 0.5 g/dL (ref 0.4–1.8)
Globulin, Total: 3.2 g/dL (ref 2.2–3.9)
IgA: 68 mg/dL (ref 64–422)
IgG (Immunoglobin G), Serum: 723 mg/dL (ref 586–1602)
IgM (Immunoglobulin M), Srm: <5 mg/dL — ABNORMAL LOW (ref 26–217)
Total Protein ELP: 7.2 g/dL (ref 6.0–8.5)

## 2023-07-08 ENCOUNTER — Encounter: Payer: Self-pay | Admitting: Internal Medicine

## 2023-07-08 ENCOUNTER — Inpatient Hospital Stay: Payer: 59 | Attending: Nurse Practitioner

## 2023-07-08 ENCOUNTER — Inpatient Hospital Stay (HOSPITAL_BASED_OUTPATIENT_CLINIC_OR_DEPARTMENT_OTHER): Payer: 59 | Attending: Nurse Practitioner | Admitting: Internal Medicine

## 2023-07-08 VITALS — BP 150/88 | HR 64 | Temp 97.5°F | Resp 16 | Wt 166.0 lb

## 2023-07-08 DIAGNOSIS — C9002 Multiple myeloma in relapse: Secondary | ICD-10-CM | POA: Diagnosis not present

## 2023-07-08 DIAGNOSIS — C9000 Multiple myeloma not having achieved remission: Secondary | ICD-10-CM | POA: Insufficient documentation

## 2023-07-08 DIAGNOSIS — Z87891 Personal history of nicotine dependence: Secondary | ICD-10-CM | POA: Diagnosis not present

## 2023-07-08 LAB — CBC WITH DIFFERENTIAL/PLATELET
Abs Immature Granulocytes: 0.01 10*3/uL (ref 0.00–0.07)
Basophils Absolute: 0 10*3/uL (ref 0.0–0.1)
Basophils Relative: 1 %
Eosinophils Absolute: 0.1 10*3/uL (ref 0.0–0.5)
Eosinophils Relative: 2 %
HCT: 31.9 % — ABNORMAL LOW (ref 36.0–46.0)
Hemoglobin: 9.7 g/dL — ABNORMAL LOW (ref 12.0–15.0)
Immature Granulocytes: 0 %
Lymphocytes Relative: 43 %
Lymphs Abs: 2.1 10*3/uL (ref 0.7–4.0)
MCH: 26.1 pg (ref 26.0–34.0)
MCHC: 30.4 g/dL (ref 30.0–36.0)
MCV: 85.8 fL (ref 80.0–100.0)
Monocytes Absolute: 0.3 10*3/uL (ref 0.1–1.0)
Monocytes Relative: 6 %
Neutro Abs: 2.3 10*3/uL (ref 1.7–7.7)
Neutrophils Relative %: 48 %
Platelets: 243 10*3/uL (ref 150–400)
RBC: 3.72 MIL/uL — ABNORMAL LOW (ref 3.87–5.11)
RDW: 15 % (ref 11.5–15.5)
WBC: 4.8 10*3/uL (ref 4.0–10.5)
nRBC: 0 % (ref 0.0–0.2)

## 2023-07-08 LAB — BASIC METABOLIC PANEL - CANCER CENTER ONLY
Anion gap: 9 (ref 5–15)
BUN: 11 mg/dL (ref 8–23)
CO2: 21 mmol/L — ABNORMAL LOW (ref 22–32)
Calcium: 8.2 mg/dL — ABNORMAL LOW (ref 8.9–10.3)
Chloride: 110 mmol/L (ref 98–111)
Creatinine: 0.79 mg/dL (ref 0.44–1.00)
GFR, Estimated: >60 mL/min (ref >60)
Glucose, Bld: 105 mg/dL — ABNORMAL HIGH (ref 70–99)
Potassium: 3.3 mmol/L — ABNORMAL LOW (ref 3.5–5.1)
Sodium: 140 mmol/L (ref 135–145)

## 2023-07-08 MED ORDER — MAGNESIUM OXIDE -MG SUPPLEMENT 400 (240 MG) MG PO TABS: 400.0000 mg | ORAL_TABLET | Freq: Two times a day (BID) | ORAL | 3 refills | Status: DC

## 2023-07-08 MED ORDER — TRAMADOL HCL 50 MG PO TABS: 50.0000 mg | ORAL_TABLET | Freq: Two times a day (BID) | ORAL | 1 refills | Status: DC | PRN

## 2023-07-08 MED ORDER — ACYCLOVIR 400 MG PO TABS: 400.0000 mg | ORAL_TABLET | Freq: Two times a day (BID) | ORAL | 6 refills | Status: DC

## 2023-07-08 NOTE — Progress Notes (Signed)
Patient is needing refills on her Magnesium, acyclovir, and tramadol.

## 2023-07-08 NOTE — Progress Notes (Signed)
[name] OFFICE PROGRESS NOTE  Patient Care Team: Leanna Sato, MD as PCP - General (Family Medicine) End, Cristal Deer, MD as PCP - Cardiology (Cardiology) Eddie Candle, [name] Earna Coder, MD as Consulting Physician (Hematology and Oncology) Vida Rigger, MD as Consulting Physician (Pulmonary Disease) Tedd Sias Marlana Salvage, MD as Consulting Physician (Endocrinology)   Cancer Staging  Multiple myeloma not having achieved remission [facility]) Staging form: Plasma Cell Myeloma and Plasma Cell Disorders, AJCC 8th Edition - Clinical: No stage assigned - Unsigned - Clinical: No stage assigned - Unsigned   Oncology History Overview Note  # SEP 2018- MULTIPLE MYELOMA IgALamda [2.5 gm/dl; K/L= 56/2130]; STAGE III [beta 2 microglobulin=5.5] [presented with acute renal failure; anemia; NO hypercalcemia; Skeletal survey-Normal]; BMBx- 45% plasma cells; FISH-POSITIVE 11:14 translocation.[STANDARD-high RISK]/cyto-Normal; SEP 2018- PET- L3 posterior element lesion.   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 No morphologic support for residual myeloma disease. Negative for minimal residual disease by MM-MRD flow cytometry; HOLD REVLIMID [leg swelling]; AUG 2021-PET scan negative for myeloma; continue to hold Revlimid  # MARCH 2021-diastolic congestive heart failure [Dr.End]  # BMBx- OCT 2021- BMBx- 5% plasmacytosis-however this appears to be  more polyclonal rather than monoclonal-not explain patient worsening anemia.   # OCT 2022- 18th- Dara- Rev-Dex; MARCH, 2024- PROGRESSION bone marrow biopsy 70% plasma cells; PET scan no bone lesions-however conus medullaris uptake-MRI lumbar spine-   # MAY 2024- DISCONTINUE carfilzomib-dexamethasone- Pomalyst; [rising M-protein; MAY 2024- 1.3. ]   # start 12/27/2022 -Isa-Carfil-Dex;  [decrease dexamethasone 20 weekly- starting 05/04/23- sec to glaucoma].   # OCT 24th, 2024- Bone marrow Biopsy: The findings are consistent with  focal minimal residual lambda restricted plasma cell neoplasm- HOLD treatment sec to glaucoma; and drop in EF AUG 2024- 45-50% [March 2024- 50-55%]  #November 2021-hyperthyroidism/goiter- [D.Bennett/Dr.Solum]-methimazole. S/p Thyroidectomy [Dr.Kim; [facility]-AUG 2022]  --------------------------  # 12/12- RIGHT JUGULAR DVT-x 13m on xarelto; finished April 2020; September 2020-EGD/dysphagia; Dr. Tobi Bastos  # Acute renal failure [Dr.Singh; Proteinuria 1.5gm/day ]; acyclovir/Asprin ------------------------------------------------------------------------------------------------------------   DIAGNOSIS: [ ]  MULTIPLE MYELOMA  STAGE: III/HIGH RISK ;GOALS: CONTROL      Multiple myeloma not having achieved remission (HCC)  11/21/2017 - 04/28/2018 Chemotherapy   Patient is on Treatment Plan : MYELOMA SALVAGE Cyclophosphamide / Carfilzomib / Dexamethasone (CCd) q28d     05/12/2021 - 02/25/2022 Chemotherapy   Patient is on Treatment Plan : MYELOMA RELAPSED REFRACTORY Daratumumab SQ + Lenalidomide + Dexamethasone (DaraRd) q28d     05/12/2021 - 07/23/2022 Chemotherapy   Patient is on Treatment Plan : MYELOMA RELAPSED REFRACTORY Daratumumab SQ + Lenalidomide + Dexamethasone (DaraRd) q28d     11/02/2022 - 11/16/2022 Chemotherapy   Patient is on Treatment Plan : MYELOMA RELAPSED/ REFRACTORY Carfilzomib D1,8,15 (20/27) + Pomalidomide + Dexamethasone (KPd) q28d     12/27/2022 -  Chemotherapy    Patient is on Treatment Plan : MYELOMA Carfilzomib + Dexamethasone + Isatuximab q28d      INTERVAL HISTORY: Ambulating  independently.   Alone.  [name] 72 y.o.  female pleasant patient above history of relapsed multiple myeloma--most recently on  Isa- Carfil-Dex is here for a follow up/elevated blood in the bone marrow biopsy.  Treatments on hold because of recent glaucoma- ? Steroid induced. Vision not any worse; and denies any eye pain.   Patient denies any pain. Appetite is good. No  dyspnea . Mild fatigue.  No fevers or chills. No infections. Denies any worsening pain.  Patient taking tramadol as needed for upper and leg cramps. Mild tingling in feet.    Review of Systems  Constitutional:  Positive for malaise/fatigue. Negative for chills, diaphoresis and fever.  HENT:  Negative for nosebleeds and sore throat.   Eyes:  Negative for double vision.  Respiratory:  Negative for hemoptysis.   Cardiovascular:  Negative for chest pain, palpitations and orthopnea.  Gastrointestinal:  Negative for abdominal pain, blood in stool, constipation, heartburn and melena.  Genitourinary:  Negative for dysuria, frequency and urgency.  Musculoskeletal:  Positive for back pain.  Skin: Negative.  Negative for itching and rash.  Neurological:  Negative for dizziness, tingling, focal weakness, weakness and headaches.  Endo/Heme/Allergies:  Does not bruise/bleed easily.  Psychiatric/Behavioral:  Negative for depression. The patient is not nervous/anxious.     PAST MEDICAL HISTORY :  Past Medical History:  Diagnosis Date   (HFpEF) heart failure with preserved ejection fraction (HCC)    a. 06/2020 Echo: EF 60-65%, no rwma, nl RV size/fxn. Triv MR. Triv TR/AI. Mod elev PASP.   Anxiety    COPD (chronic obstructive pulmonary disease) (HCC)    GERD (gastroesophageal reflux disease)    Hypertension    Hyperthyroidism    Hypokalemia    IgA myeloma (HCC)    Multiple myeloma (HCC)    Pneumonia     Red blood cell antibody positive, compatible PRBC difficult to obtain    S/P autologous bone marrow transplantation (HCC)     PAST SURGICAL HISTORY :   Past Surgical History:  Procedure Laterality Date   ANTERIOR VITRECTOMY Left 10/07/2021   Procedure: ANTERIOR VITRECTOMY;  Surgeon: Lockie Mola, MD;  Location: [name];  Service: Ophthalmology;  Laterality: Left;   CATARACT EXTRACTION W/PHACO Left 10/07/2021   Procedure: CATARACT EXTRACTION PHACO AND INTRAOCULAR LENS PLACEMENT (IOC) LEFT VISION BLUE;  Surgeon: Lockie Mola, MD;  Location[facility];  Service: Ophthalmology;  Laterality: Left;  kit for manual incision 14.56 01:21.4   CATARACT EXTRACTION W/PHACO Right 10/21/2021   Procedure: CATARACT EXTRACTION PHACO AND INTRAOCULAR LENS PLACEMENT (IOC) RIGHT 11.12 01:48.1;  Surgeon: Lockie Mola, MD;  Location: [facility];  Service: Ophthalmology;  Laterality: Right;   ESOPHAGOGASTRODUODENOSCOPY (EGD) WITH PROPOFOL N/A 03/30/2019   Procedure: ESOPHAGOGASTRODUODENOSCOPY (EGD) WITH PROPOFOL;  Surgeon: Wyline Mood, MD;  Location: [name];  Service: Gastroenterology;  Laterality: N/A;   IR BONE MARROW BIOPSY & ASPIRATION  05/25/2023   IR FLUORO GUIDE PORT INSERTION RIGHT  12/16/2017   IR IMAGING GUIDED PORT INSERTION  06/15/2021   KNEE ARTHROSCOPY Left 09/03/2022   Procedure: ARTHROSCOPY KNEE DEBRIDEMENT;  Surgeon: Lyndle Herrlich, MD;  Location: [facility];  Service: Orthopedics;  Laterality: Left;   THYROIDECTOMY N/A     FAMILY HISTORY :   Family History  Problem Relation Age of Onset   Pneumonia Mother    Seizures Father     SOCIAL HISTORY:   Social History   Tobacco Use   Smoking status: Former    Current packs/day: 0.00    Types: Cigarettes    Quit date:  03/18/2021    Years since quitting: 2.3   Smokeless tobacco: Never   Tobacco comments:    2 cigarettes QOD  Vaping Use   Vaping status: Never Used  Substance Use  Topics   Alcohol use: No   Drug use: No    ALLERGIES:  has no known allergies.  MEDICATIONS:  Current Outpatient Medications  Medication Sig Dispense Refill   acyclovir (ZOVIRAX) 400 MG tablet Take 1 tablet (400 mg total) by mouth 2 (two) times daily. 60 tablet 6   albuterol (VENTOLIN HFA) 108 (90 Base) MCG/ACT inhaler Inhale 2 puffs into the lungs every 6 (six) hours as needed for wheezing or shortness of breath. 8 g 3   aspirin EC 81 MG EC tablet Take 1 tablet (81 mg total) by mouth daily. 90 tablet 3   brimonidine (ALPHAGAN P) 0.1 % SOLN Place 1 drop into both eyes 2 (two) times daily.     Brinzolamide-Brimonidine (SIMBRINZA) 1-0.2 % SUSP Apply 1 drop to eye in the morning and at bedtime. I drop both eyes twice a day     calcium carbonate (CALCIUM 600 HIGH POTENCY) 600 MG TABS tablet Take 2 tablets (1,200 mg total) by mouth 2 (two) times daily with a meal. 120 tablet 1   carvedilol (COREG) 3.125 MG tablet Take 1 tablet (3.125 mg total) by mouth 2 (two) times daily with a meal. 60 tablet 5   furosemide (LASIX) 40 MG tablet Take 1 tablet (40 mg total) by mouth daily. 90 tablet 3   Latanoprostene Bunod (VYZULTA) 0.024 % SOLN Apply 1 drop to eye at bedtime. 1 drop Left eye every night     levothyroxine (SYNTHROID) 150 MCG tablet Take 1 tablet (150 mcg total) by mouth daily before breakfast. Take before eating or drinking anything. DO Not  eat or drink until after an hour later after taking medication. 30 tablet 3   lidocaine-prilocaine (EMLA) cream Apply 1 application topically as needed. Apply to port and cover with saran wrap 1-2 hours prior to port access 30 g 1   magnesium oxide (MAG-OX) 400 (240 Mg) MG tablet Take 1 tablet (400 mg total) by mouth 2 (two) times daily. 60 tablet 3   montelukast (SINGULAIR) 10 MG tablet Take 1 tablet (10 mg total) by mouth at bedtime. 30 tablet 3   ondansetron (ZOFRAN) 8 MG tablet One pill every 8 hours as needed for nausea/vomitting. 40 tablet 1    pantoprazole (PROTONIX) 40 MG tablet Take 1 tablet (40 mg total) by mouth daily. 90 tablet 0   polyethylene glycol (MIRALAX / GLYCOLAX) packet Take 17 g by mouth daily as needed for mild constipation. 14 each 0   potassium chloride SA (KLOR-CON M) 20 MEQ tablet Take 1 tablet (20 mEq total) by mouth 2 (two) times daily. 60 tablet 6   spironolactone (ALDACTONE) 25 MG tablet Take 1 tablet by mouth once daily 90 tablet 3   traMADol (ULTRAM) 50 MG tablet Take 1 tablet (50 mg total) by mouth every 12 (twelve) hours as needed. 60 tablet 1   Vitamin D, Ergocalciferol, (DRISDOL) 1.25 MG (50000 UNIT) CAPS capsule Take 1 capsule (50,000 Units total) by mouth once a week. 12 capsule 0   No current facility-administered medications for this visit.   Facility-Administered Medications Ordered in Other Visits  Medication Dose Route Frequency Provider Last Rate Last Admin   0.9 %  sodium chloride infusion   Intravenous Continuous Ralene Muskrat, PA-C  PHYSICAL EXAMINATION:   BP (!) 150/88 (BP Location: Right Arm, Patient Position: Sitting)   Pulse 64   Temp (!) 97.5 F (36.4 C) (Tympanic)   Resp 16   Wt 166 lb (75.3 kg)   SpO2 100%   BMI 30.36 kg/m   Filed Weights   07/08/23 0955  Weight: 166 lb (75.3 kg)      Mild leg swelling.   Physical Exam HENT:     Head: Normocephalic and atraumatic.  Eyes:     Pupils: Pupils are equal, round, and reactive to light.  Cardiovascular:     Rate and Rhythm: Normal rate and regular rhythm.  Pulmonary:     Effort: Pulmonary effort is normal. No respiratory distress.     Breath sounds: Normal breath sounds. No wheezing.  Abdominal:     General: Bowel sounds are normal. There is no distension.     Palpations: Abdomen is soft. There is no mass.     Tenderness: There is no abdominal tenderness. There is no guarding or rebound.  Musculoskeletal:        General: No tenderness. Normal range of motion.     Cervical back: Normal range of motion and  neck supple.  Skin:    General: Skin is warm.  Neurological:     Mental Status: She is alert and oriented to person, place, and time.  Psychiatric:        Mood and Affect: Affect normal.    LABORATORY DATA:  I have reviewed the data as listed    Component Value Date/Time   NA 140 07/08/2023 0939   NA 141 01/25/2020 1530   K 3.3 (L) 07/08/2023 0939   CL 110 07/08/2023 0939   CO2 21 (L) 07/08/2023 0939   GLUCOSE 105 (H) 07/08/2023 0939   BUN 11 07/08/2023 0939   BUN 19 01/25/2020 1530   CREATININE 0.79 07/08/2023 0939   CALCIUM 8.2 (L) 07/08/2023 0939   PROT 7.2 05/11/2023 0812   ALBUMIN 3.9 05/11/2023 0812   AST 13 (L) 05/11/2023 0812   ALT 10 05/11/2023 0812   ALKPHOS 52 05/11/2023 0812   BILITOT 0.6 05/11/2023 0812   GFRNONAA >60 07/08/2023 0939   GFRAA NOT CALCULATED 04/17/2020 1352    No results found for: "SPEP", "UPEP"  Lab Results  Component Value Date   WBC 4.8 07/08/2023   NEUTROABS 2.3 07/08/2023   HGB 9.7 (L) 07/08/2023   HCT 31.9 (L) 07/08/2023   MCV 85.8 07/08/2023   PLT 243 07/08/2023      Chemistry      Component Value Date/Time   NA 140 07/08/2023 0939   NA 141 01/25/2020 1530   K 3.3 (L) 07/08/2023 0939   CL 110 07/08/2023 0939   CO2 21 (L) 07/08/2023 0939   BUN 11 07/08/2023 0939   BUN 19 01/25/2020 1530   CREATININE 0.79 07/08/2023 0939      Component Value Date/Time   CALCIUM 8.2 (L) 07/08/2023 0939   ALKPHOS 52 05/11/2023 0812   AST 13 (L) 05/11/2023 0812   ALT 10 05/11/2023 0812   BILITOT 0.6 05/11/2023 0812      (H): Data is abnormally high   Latest Reference Range & Units Most Recent 12/24/20 14:01 04/01/21 14:34 04/28/21 09:17 06/11/21 09:42 07/03/21 10:32 08/20/21 10:06  M Protein SerPl Elph-Mcnc Not Observed g/dL 1.1 (H) (C) 1/61/09 60:45 Not Observed (C) 0.6 (H) (C) 0.7 (H) (C) 0.5 (H) (C) 0.7 (H) (C) 1.1 (H) (C)  Latest Reference Range & Units Most Recent 09/17/21 08:47 10/15/21 08:25 11/12/21 08:55  Kappa free  light chain 3.3 - 19.4 mg/L 5.0 11/12/21 08:55 7.2 7.2 5.0  Lambda free light chains 5.7 - 26.3 mg/L 223.6 (H) 11/12/21 08:55 215.3 (H) 340.2 (H) 223.6 (H)  Kappa, lambda light chain ratio 0.26 - 1.65  0.02 (L) 11/12/21 08:55 0.03 (L) 0.02 (L) 0.02 (L)  (H): Data is abnormally high (L): Data is abnormally low  RADIOGRAPHIC STUDIES: I have personally reviewed the radiological images as listed and agreed with the findings in the report. No results found.   ASSESSMENT & PLAN:  Multiple myeloma not having achieved remission (HCC) # RECURRENT Multiple myeloma stage III [high-risk cytogenetics-status post KRD-VGPR ; status post autologous stem cell transplant on 06/15/18.   MAY 2024- 1.3. ] start 12/27/2022-Isa-Carfil-Dex [[facility]] OCT 24th, 2024- Bone marrow Biopsy: The findings are consistent with  focal minimal residual lambda restricted plasma cell neoplasm. HOLD since - oct 15th, 2024.   # Most recently on patient 12/27/2022-Isa-Dex; d-2carfil weekly q 28 days-currently on hold since May 10, 2023.  -Because of good response to therapy/ and Worsening glaucoma/Drop in EF-  MARCH 2024-Echo [[facility]] Left ventricular EF 50 to 55%; AUG 13th/OCT 23rd, 2024- EF-45-50%.  Recommend monitoring for now.  Continue maintenance therapy-at subsequent visit without steroids.   # Anemia-Hb 9-10-  I ron sat- 8%- Recommend gentle iron [iron biglycinate; 28 mg ] 1 pill a day.  This pill is unlikely to cause stomach upset or cause constipation.  Hold venofer for now.  # Electrolyte: Hypokalemia: mild-  continue Kdur TID; hypocalcemia- 8.0- vit D SEP 2024- 50- continie Ergo 50k weekly. REcommend ca BID    sep 2024- Vit D 54.   # Iatrogenic hypothyroidism [ Graves/goiter s/p total thyroidectomy;aug,2022 ]-currently on Synthroid 150 mcg/day-[increased in AUG, 2024-  again recommend  taking the pills fasting-stable.   # Glaucoma BIL- L > R [Dr.King; [facility]] - ? Steroid induced on eye drops. decreased the steroids.   Discussed with ophthalmology- HOLD chemo for now. .  # Lower back discomfort x 6 months-see above.  MRI lumbar spine as above.  Continue tramadol- refilled . stable  # Bone lesions /last zometa on 11/27/2017.  S/p   Dental extraction PET scan evidence of any bone lesions.- consider Zometa  at next visit.   # Infection prophylaxis: Acyclovir; asprin- add IVIG infusion [IgG < 400]- plan  IVIG- q 4 w #1 on- 9/17- stable; refilled   # IV access: mediport- functional  # Vaccination: Flu shot today [sep 2024] s/p Pneumonia vaccination [march 2024]- stable.   6 w- given dental extrac- on 1/26. :MM panel; K/l light chain ratio q 4 W  D-isa-dex;car1&2-car; weekly x 3; week 4-OFF;  q 28 day cycle; NO zometa- dental issues-ok; but hypocalemia  # DISPOSITION: # in 4 weeks MD port/labs-cbc;cmp; MM panel; k/l light chains;iron studies; ferritin-; vit D 25-OH- thyroid profile- No Chemo; possible Zometa- -Dr.B   Orders Placed This Encounter  Procedures   CBC with Differential ([name] Only)    Standing Status:   Future    Expected Date:   08/08/2023    Expiration Date:   07/07/2024   CMP ([name] only)    Standing Status:   Future    Expected Date:   08/08/2023    Expiration Date:   07/07/2024   Multiple Myeloma Panel (SPEP&IFE w/QIG)    Standing Status:   Future    Expected Date:  08/08/2023    Expiration Date:   07/07/2024   Kappa/lambda light chains    Standing Status:   Future    Expected Date:   08/08/2023    Expiration Date:   07/07/2024   Iron and TIBC    Standing Status:   Future    Expected Date:   08/08/2023    Expiration Date:   07/07/2024   Ferritin    Standing Status:   Future    Expected Date:   08/08/2023    Expiration Date:   07/07/2024   VITAMIN D 25 Hydroxy (Vit-D Deficiency, Fractures)    Standing Status:   Future    Expected Date:   08/08/2023    Expiration Date:   07/07/2024   Thyroid Panel With TSH    Standing Status:   Future    Expected Date:    08/08/2023    Expiration Date:   07/07/2024   All questions were answered. The patient knows to call the clinic with any problems, questions or concerns.      Earna Coder, MD 07/08/2023 12:08 PM End

## 2023-07-08 NOTE — Assessment & Plan Note (Addendum)
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   MAY 2024- 1.3. ] start 12/27/2022-Isa-Carfil-Dex [[facility]] OCT 24th, 2024- Bone marrow Biopsy: The findings are consistent with  focal minimal residual lambda restricted plasma cell neoplasm. HOLD since - oct 15th, 2024.   # Most recently on patient 12/27/2022-Isa-Dex; d-2carfil weekly q 28 days-currently on hold since May 10, 2023.  -Because of good response to therapy/ and Worsening glaucoma/Drop in EF-  MARCH 2024-Echo [[facility]] Left ventricular EF 50 to 55%; AUG 13th/OCT 23rd, 2024- EF-45-50%.  Recommend monitoring for now.  Connsider maintenance therapy-at subsequent visit without steroids.   # Anemia-Hb 9-10-  I ron sat- 8%- Recommend gentle iron [iron biglycinate; 28 mg ] 1 pill a day.  This pill is unlikely to cause stomach upset or cause constipation.  Hold venofer for now.  # Electrolyte: Hypokalemia: mild-  continue Kdur TID; hypocalcemia- 8.0- vit D SEP 2024- 50- continie Ergo 50k weekly. REcommend ca BID    sep 2024- Vit D 54.   # Iatrogenic hypothyroidism [ Graves/goiter s/p total thyroidectomy;aug,2022 ]-currently on Synthroid 150 mcg/day-[increased in AUG, 2024-  again recommend  taking the pills fasting-stable.   # Glaucoma BIL- L > R [Dr.King; [facility]] - ? Steroid induced on eye drops. decreased the steroids.  Discussed with ophthalmology- HOLD chemo for now. .  # Lower back discomfort x 6 months-see above.  MRI lumbar spine as above.  Continue tramadol- refilled . stable  # Bone lesions /last zometa on 11/27/2017.  S/p   Dental extraction PET scan evidence of any bone lesions.- consider Zometa  at next visit.   # Infection prophylaxis: Acyclovir; asprin- add IVIG infusion [IgG < 400]- plan  IVIG- q 4 w #1 on- 9/17- stable; refilled   # IV access: mediport- functional  # Vaccination: Flu shot today [sep 2024] s/p Pneumonia vaccination  [march 2024]- stable.   6 w- given dental extrac- on 1/26. :MM panel; K/l light chain ratio q 4 W  D-isa-dex;car1&2-car; weekly x 3; week 4-OFF;  q 28 day cycle; NO zometa- dental issues-ok; but hypocalemia  # DISPOSITION: # in 4 weeks MD port/labs-cbc;cmp; MM panel; k/l light chains;iron studies; ferritin-; vit D 25-OH- thyroid profile- No Chemo; possible Zometa- -Dr.B

## 2023-07-08 NOTE — Patient Instructions (Signed)
#  Recommend gentle iron [iron biglycinate; 28 mg ] 1 pill a day.  This pill is unlikely to cause stomach upset or cause constipation.  

## 2023-07-09 ENCOUNTER — Other Ambulatory Visit: Payer: Self-pay

## 2023-07-10 LAB — THYROID PANEL WITH TSH
Free Thyroxine Index: 1.3 (ref 1.2–4.9)
T3 Uptake Ratio: 20 % — ABNORMAL LOW (ref 24–39)
T4, Total: 6.6 ug/dL (ref 4.5–12.0)
TSH: 17.9 u[IU]/mL — ABNORMAL HIGH (ref 0.450–4.500)

## 2023-07-11 LAB — KAPPA/LAMBDA LIGHT CHAINS
Kappa free light chain: 3.5 mg/L (ref 3.3–19.4)
Kappa, lambda light chain ratio: 0.18 — ABNORMAL LOW (ref 0.26–1.65)
Lambda free light chains: 19.5 mg/L (ref 5.7–26.3)

## 2023-07-15 LAB — MULTIPLE MYELOMA PANEL, SERUM
Albumin SerPl Elph-Mcnc: 3.8 g/dL (ref 2.9–4.4)
Albumin/Glob SerPl: 1.6 (ref 0.7–1.7)
Alpha 1: 0.1 g/dL (ref 0.0–0.4)
Alpha2 Glob SerPl Elph-Mcnc: 0.6 g/dL (ref 0.4–1.0)
B-Globulin SerPl Elph-Mcnc: 1.1 g/dL (ref 0.7–1.3)
Gamma Glob SerPl Elph-Mcnc: 0.5 g/dL (ref 0.4–1.8)
Globulin, Total: 2.4 g/dL (ref 2.2–3.9)
IgA: 111 mg/dL (ref 64–422)
IgG (Immunoglobin G), Serum: 578 mg/dL — ABNORMAL LOW (ref 586–1602)
IgM (Immunoglobulin M), Srm: 5 mg/dL — ABNORMAL LOW (ref 26–217)
Total Protein ELP: 6.2 g/dL (ref 6.0–8.5)

## 2023-07-26 ENCOUNTER — Encounter: Payer: Self-pay | Admitting: Internal Medicine

## 2023-07-28 ENCOUNTER — Encounter: Payer: Self-pay | Admitting: Internal Medicine

## 2023-08-05 ENCOUNTER — Inpatient Hospital Stay: Payer: 59

## 2023-08-05 ENCOUNTER — Encounter: Payer: Self-pay | Admitting: Nurse Practitioner

## 2023-08-05 ENCOUNTER — Encounter: Payer: Self-pay | Admitting: Internal Medicine

## 2023-08-05 ENCOUNTER — Inpatient Hospital Stay (HOSPITAL_BASED_OUTPATIENT_CLINIC_OR_DEPARTMENT_OTHER): Payer: 59 | Attending: Nurse Practitioner | Admitting: Nurse Practitioner

## 2023-08-05 ENCOUNTER — Inpatient Hospital Stay: Payer: 59 | Attending: Nurse Practitioner

## 2023-08-05 VITALS — BP 141/91 | HR 56 | Temp 98.2°F | Wt 165.0 lb

## 2023-08-05 DIAGNOSIS — C9002 Multiple myeloma in relapse: Secondary | ICD-10-CM | POA: Diagnosis present

## 2023-08-05 DIAGNOSIS — E876 Hypokalemia: Secondary | ICD-10-CM

## 2023-08-05 DIAGNOSIS — Z87891 Personal history of nicotine dependence: Secondary | ICD-10-CM | POA: Insufficient documentation

## 2023-08-05 DIAGNOSIS — C9000 Multiple myeloma not having achieved remission: Secondary | ICD-10-CM | POA: Diagnosis not present

## 2023-08-05 DIAGNOSIS — Z5181 Encounter for therapeutic drug level monitoring: Secondary | ICD-10-CM | POA: Diagnosis not present

## 2023-08-05 DIAGNOSIS — Z7983 Long term (current) use of bisphosphonates: Secondary | ICD-10-CM

## 2023-08-05 LAB — CMP (CANCER CENTER ONLY)
ALT: 9 U/L (ref 0–44)
AST: 17 U/L (ref 15–41)
Albumin: 4.2 g/dL (ref 3.5–5.0)
Alkaline Phosphatase: 64 U/L (ref 38–126)
Anion gap: 10 (ref 5–15)
BUN: 14 mg/dL (ref 8–23)
CO2: 21 mmol/L — ABNORMAL LOW (ref 22–32)
Calcium: 8.5 mg/dL — ABNORMAL LOW (ref 8.9–10.3)
Chloride: 109 mmol/L (ref 98–111)
Creatinine: 0.66 mg/dL (ref 0.44–1.00)
GFR, Estimated: >60 mL/min (ref >60)
Glucose, Bld: 117 mg/dL — ABNORMAL HIGH (ref 70–99)
Potassium: 3.1 mmol/L — ABNORMAL LOW (ref 3.5–5.1)
Sodium: 140 mmol/L (ref 135–145)
Total Bilirubin: 0.4 mg/dL (ref 0.0–1.2)
Total Protein: 6.8 g/dL (ref 6.5–8.1)

## 2023-08-05 LAB — CBC WITH DIFFERENTIAL (CANCER CENTER ONLY)
Abs Immature Granulocytes: 0.01 10*3/uL (ref 0.00–0.07)
Basophils Absolute: 0 10*3/uL (ref 0.0–0.1)
Basophils Relative: 1 %
Eosinophils Absolute: 0.1 10*3/uL (ref 0.0–0.5)
Eosinophils Relative: 2 %
HCT: 31.5 % — ABNORMAL LOW (ref 36.0–46.0)
Hemoglobin: 9.6 g/dL — ABNORMAL LOW (ref 12.0–15.0)
Immature Granulocytes: 0 %
Lymphocytes Relative: 42 %
Lymphs Abs: 2.1 10*3/uL (ref 0.7–4.0)
MCH: 25.7 pg — ABNORMAL LOW (ref 26.0–34.0)
MCHC: 30.5 g/dL (ref 30.0–36.0)
MCV: 84.2 fL (ref 80.0–100.0)
Monocytes Absolute: 0.3 10*3/uL (ref 0.1–1.0)
Monocytes Relative: 6 %
Neutro Abs: 2.4 10*3/uL (ref 1.7–7.7)
Neutrophils Relative %: 49 %
Platelet Count: 223 10*3/uL (ref 150–400)
RBC: 3.74 MIL/uL — ABNORMAL LOW (ref 3.87–5.11)
RDW: 15.7 % — ABNORMAL HIGH (ref 11.5–15.5)
WBC Count: 4.9 10*3/uL (ref 4.0–10.5)
nRBC: 0 % (ref 0.0–0.2)

## 2023-08-05 LAB — IRON AND TIBC
Iron: 36 ug/dL (ref 28–170)
Saturation Ratios: 8 % — ABNORMAL LOW (ref 10.4–31.8)
TIBC: 468 ug/dL — ABNORMAL HIGH (ref 250–450)
UIBC: 432 ug/dL

## 2023-08-05 LAB — VITAMIN D 25 HYDROXY (VIT D DEFICIENCY, FRACTURES): Vit D, 25-Hydroxy: 62.16 ng/mL (ref 30–100)

## 2023-08-05 LAB — FERRITIN: Ferritin: 19 ng/mL (ref 11–307)

## 2023-08-05 MED ORDER — LEVOTHYROXINE SODIUM 150 MCG PO TABS: 150.0000 ug | ORAL_TABLET | Freq: Every day | ORAL | 0 refills | Status: DC

## 2023-08-05 MED ORDER — CALCIUM CARBONATE 600 MG PO TABS: 1200.0000 mg | ORAL_TABLET | Freq: Two times a day (BID) | ORAL | 2 refills | Status: AC

## 2023-08-05 MED ORDER — POTASSIUM CHLORIDE CRYS ER 20 MEQ PO TBCR: 60.0000 meq | EXTENDED_RELEASE_TABLET | Freq: Every day | ORAL | 2 refills | Status: DC

## 2023-08-05 NOTE — Progress Notes (Signed)
 [facility] OFFICE PROGRESS NOTE  Patient Care Team: Buren Rock HERO, MD as PCP - General (Family Medicine) End, Lonni, MD as PCP - Cardiology (Cardiology) Guinevere File, [name] Rennie Cindy SAUNDERS, MD as Consulting Physician (Hematology and Oncology) Parris Manna, MD as Consulting Physician (Pulmonary Disease) Damian Therisa HERO, MD as Consulting Physician (Endocrinology)   Cancer Staging  Multiple myeloma not having achieved remission [facility]) Staging form: Plasma Cell Myeloma and Plasma Cell Disorders, AJCC 8th Edition - Clinical: No stage assigned - Unsigned - Clinical: No stage assigned - Unsigned   Oncology History Overview Note  # SEP 2018- MULTIPLE MYELOMA IgALamda [2.5 gm/dl; K/L= 05/8700]; STAGE III [beta 2 microglobulin=5.5] [presented with acute renal failure; anemia; NO hypercalcemia; Skeletal survey-Normal]; BMBx- 45% plasma cells; FISH-POSITIVE 11:14 translocation.[STANDARD-high RISK]/cyto-Normal; SEP 2018- PET- L3 posterior element lesion.   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 No morphologic support for residual myeloma disease. Negative for minimal residual disease by MM-MRD flow cytometry; HOLD REVLIMID  [leg swelling]; AUG 2021-PET scan negative for myeloma; continue to hold Revlimid   # MARCH 2021-diastolic congestive heart failure [Dr.End]  # BMBx- OCT 2021- BMBx- 5% plasmacytosis-however this appears to be  more polyclonal rather than monoclonal-not explain patient worsening anemia.   # OCT 2022- 18th- Dara- Rev-Dex; MARCH, 2024- PROGRESSION bone marrow biopsy 70% plasma cells; PET scan no bone lesions-however conus medullaris uptake-MRI lumbar spine-   # MAY 2024- DISCONTINUE carfilzomib -dexamethasone - Pomalyst ; [rising M-protein; MAY 2024- 1.3. ]   # start 12/27/2022 -Isa-Carfil-Dex;  [decrease dexamethasone  20 weekly- starting 05/04/23- sec to glaucoma].   # OCT 24th, 2024- Bone marrow Biopsy: The findings are consistent with  focal minimal residual lambda restricted plasma cell neoplasm- HOLD treatment sec to glaucoma; and drop in EF AUG 2024- 45-50% [March 2024- 50-55%]  #November 2021-hyperthyroidism/goiter- [D.Bennett/Dr.Solum]-methimazole . S/p Thyroidectomy [Dr.Kim; [facility]-AUG 2022]  --------------------------  # 12/12- RIGHT JUGULAR DVT-x 16m on xarelto ; finished April 2020; September 2020-EGD/dysphagia; Dr. Therisa  # Acute renal failure [Dr.Singh; Proteinuria 1.5gm/day ]; acyclovir Mickeal ------------------------------------------------------------------------------------------------------------   DIAGNOSIS: [ ]  MULTIPLE MYELOMA  STAGE: III/HIGH RISK ;GOALS: CONTROL      Multiple myeloma not having achieved remission (HCC)  11/21/2017 - 04/28/2018 Chemotherapy   Patient is on Treatment Plan : MYELOMA SALVAGE Cyclophosphamide  / Carfilzomib  / Dexamethasone  (CCd) q28d     05/12/2021 - 02/25/2022 Chemotherapy   Patient is on Treatment Plan : MYELOMA RELAPSED REFRACTORY Daratumumab  SQ + Lenalidomide  + Dexamethasone  (DaraRd) q28d     05/12/2021 - 07/23/2022 Chemotherapy   Patient is on Treatment Plan : MYELOMA RELAPSED REFRACTORY Daratumumab  SQ + Lenalidomide  + Dexamethasone  (DaraRd) q28d     11/02/2022 - 11/16/2022 Chemotherapy   Patient is on Treatment Plan : MYELOMA RELAPSED/ REFRACTORY Carfilzomib  D1,8,15 (20/27) + Pomalidomide  + Dexamethasone  (KPd) q28d     12/27/2022 -  Chemotherapy    Patient is on Treatment Plan : MYELOMA Carfilzomib  + Dexamethasone  + Isatuximab  q28d      INTERVAL HISTORY: Ambulating  independently.   Alone.  Brandi [name] 73 y.o.  female pleasant patient above history of relapsed multiple myeloma--currently on Isa- Carfil-Dex, underwent bone marrow on 05/25/23, on chemo holiday, who returns to clinic for follow up. She feels good and denies complaints. Reports mixed compliance with medications. Vision is unchanged and she denies pain.   Review of Systems  Constitutional:  Positive for malaise/fatigue. Negative for chills, diaphoresis and fever.  HENT:  Negative for nosebleeds and sore throat.   Eyes:  Negative for double vision.  Respiratory:  Negative for hemoptysis.   Cardiovascular:  Negative for chest pain, palpitations and orthopnea.  Gastrointestinal:  Negative for abdominal pain, blood in stool, constipation, heartburn and melena.  Genitourinary:  Negative for dysuria, flank pain, frequency, hematuria and urgency.  Musculoskeletal:  Positive for joint pain. Negative for back pain, falls and myalgias.  Skin: Negative.  Negative for itching and rash.  Neurological:  Negative for dizziness, tingling, focal weakness, weakness and headaches.  Endo/Heme/Allergies:  Does not bruise/bleed easily.  Psychiatric/Behavioral:  Negative for depression. The patient is not nervous/anxious.    PAST MEDICAL HISTORY :  Past Medical History:  Diagnosis Date   (HFpEF) heart failure with preserved ejection fraction (HCC)    a. 06/2020 Echo: EF 60-65%, no rwma, nl RV size/fxn. Triv MR. Triv TR/AI. Mod elev PASP.   Anxiety    COPD (chronic obstructive pulmonary disease) (HCC)    GERD (gastroesophageal reflux disease)    Hypertension    Hyperthyroidism    Hypokalemia    IgA myeloma (HCC)    Multiple myeloma (HCC)    Pneumonia    Red blood cell antibody positive, compatible PRBC difficult to obtain    S/P autologous bone marrow transplantation (HCC)      PAST SURGICAL HISTORY :   Past Surgical History:  Procedure Laterality Date   ANTERIOR VITRECTOMY Left 10/07/2021   Procedure: ANTERIOR VITRECTOMY;  Surgeon: Mittie Gaskin, MD;  Location: [facility];  Service: Ophthalmology;  Laterality: Left;   CATARACT EXTRACTION W/PHACO Left 10/07/2021   Procedure: CATARACT EXTRACTION PHACO AND INTRAOCULAR LENS PLACEMENT (IOC) LEFT VISION BLUE;  Surgeon: Mittie Gaskin, MD;  Location: [facility];  Service: Ophthalmology;  Laterality: Left;  kit for manual incision 14.56 01:21.4   CATARACT EXTRACTION W/PHACO Right 10/21/2021   Procedure: CATARACT EXTRACTION PHACO AND INTRAOCULAR LENS PLACEMENT (IOC) RIGHT 11.12 01:48.1;  Surgeon: Mittie Gaskin, MD;  Location: [facility];  Service: Ophthalmology;  Laterality: Right;   ESOPHAGOGASTRODUODENOSCOPY (EGD) WITH PROPOFOL  N/A 03/30/2019   Procedure: ESOPHAGOGASTRODUODENOSCOPY (EGD) WITH PROPOFOL ;  Surgeon: Therisa Bi, MD;  Location: [facility];  Service: Gastroenterology;  Laterality: N/A;   IR BONE MARROW BIOPSY & ASPIRATION  05/25/2023   IR FLUORO GUIDE PORT INSERTION RIGHT  12/16/2017   IR IMAGING GUIDED PORT INSERTION  06/15/2021   KNEE ARTHROSCOPY Left 09/03/2022   Procedure: ARTHROSCOPY KNEE DEBRIDEMENT;  Surgeon: Leora Lynwood SAUNDERS, MD;  Location: [facility] ORS;  Service: Orthopedics;  Laterality: Left;   THYROIDECTOMY N/A     FAMILY HISTORY :   Family History  Problem Relation Age of Onset   Pneumonia Mother    Seizures Father     SOCIAL HISTORY:   Social History   Tobacco Use   Smoking status: Former    Current packs/day: 0.00    Types: Cigarettes    Quit date: 03/18/2021    Years since quitting: 2.3   Smokeless tobacco: Never   Tobacco comments:    2 cigarettes QOD  Vaping Use   Vaping status: Never Used  Substance  Use Topics   Alcohol use: No   Drug use: No    ALLERGIES:  has no known allergies.  MEDICATIONS:  Current Outpatient  Medications  Medication Sig Dispense Refill   acyclovir  (ZOVIRAX ) 400 MG tablet Take 1 tablet (400 mg total) by mouth 2 (two) times daily. 60 tablet 6   albuterol  (VENTOLIN  HFA) 108 (90 Base) MCG/ACT inhaler Inhale 2 puffs into the lungs every 6 (six) hours as needed for wheezing or shortness of breath. 8 g 3   aspirin  EC 81 MG EC tablet Take 1 tablet (81 mg total) by mouth daily. 90 tablet 3   brimonidine  (ALPHAGAN  P) 0.1 % SOLN Place 1 drop into both eyes 2 (two) times daily.     Brinzolamide -Brimonidine  (SIMBRINZA) 1-0.2 % SUSP Apply 1 drop to eye in the morning and at bedtime. I drop both eyes twice a day     carvedilol  (COREG ) 3.125 MG tablet Take 1 tablet (3.125 mg total) by mouth 2 (two) times daily with a meal. 60 tablet 5   furosemide  (LASIX ) 40 MG tablet Take 1 tablet (40 mg total) by mouth daily. 90 tablet 3   Latanoprostene Bunod (VYZULTA) 0.024 % SOLN Apply 1 drop to eye at bedtime. 1 drop Left eye every night     lidocaine -prilocaine  (EMLA ) cream Apply 1 application topically as needed. Apply to port and cover with saran wrap 1-2 hours prior to port access 30 g 1   magnesium  oxide (MAG-OX) 400 (240 Mg) MG tablet Take 1 tablet (400 mg total) by mouth 2 (two) times daily. 60 tablet 3   montelukast  (SINGULAIR ) 10 MG tablet Take 1 tablet (10 mg total) by mouth at bedtime. 30 tablet 3   ondansetron  (ZOFRAN ) 8 MG tablet One pill every 8 hours as needed for nausea/vomitting. 40 tablet 1   polyethylene glycol (MIRALAX  / GLYCOLAX ) packet Take 17 g by mouth daily as needed for mild constipation. 14 each 0   spironolactone  (ALDACTONE ) 25 MG tablet Take 1 tablet by mouth once daily 90 tablet 3   traMADol  (ULTRAM ) 50 MG tablet Take 1 tablet (50 mg total) by mouth every 12 (twelve) hours as needed. 60 tablet 1   Vitamin D , Ergocalciferol , (DRISDOL ) 1.25 MG (50000 UNIT) CAPS capsule Take 1 capsule (50,000 Units total) by mouth once a week. 12 capsule 0   calcium  carbonate (CALCIUM  600 HIGH POTENCY)  600 MG TABS tablet Take 2 tablets (1,200 mg total) by mouth 2 (two) times daily. 120 tablet 2   levothyroxine  (SYNTHROID ) 150 MCG tablet Take 1 tablet (150 mcg total) by mouth daily. 90 tablet 0   pantoprazole  (PROTONIX ) 40 MG tablet Take 1 tablet (40 mg total) by mouth daily. 90 tablet 0   potassium chloride  SA (KLOR-CON  M) 20 MEQ tablet Take 3 tablets (60 mEq total) by mouth daily. For low potassium 90 tablet 2   No current facility-administered medications for this visit.   Facility-Administered Medications Ordered in Other Visits  Medication Dose Route Frequency Provider Last Rate Last Admin   0.9 %  sodium chloride  infusion   Intravenous Continuous Brandi Males, PA-C        PHYSICAL EXAMINATION: BP (!) 141/91 (BP Location: Left Arm, Patient Position: Sitting, Cuff Size: Normal)   Pulse (!) 56   Temp 98.2 F (36.8 C) (Tympanic)   Wt 165 lb (74.8 kg)   SpO2 100%   BMI 30.18 kg/m   Filed Weights   08/05/23 0934  Weight: 165 lb (74.8 kg)  Physical Exam Vitals reviewed.  Constitutional:      Appearance: She is not ill-appearing.     Comments: unaccompanied  HENT:     Head: Normocephalic and atraumatic.  Cardiovascular:     Rate and Rhythm: Normal rate and regular rhythm.  Pulmonary:     Effort: Pulmonary effort is normal. No respiratory distress.     Breath sounds: No wheezing.  Abdominal:     General: There is no distension.     Palpations: Abdomen is soft.     Tenderness: There is no abdominal tenderness. There is no guarding or rebound.  Musculoskeletal:        General: No tenderness. Normal range of motion.     Cervical back: No bony tenderness.     Thoracic back: No bony tenderness.     Lumbar back: No bony tenderness.     Comments: ambulating  Lymphadenopathy:     Cervical: No cervical adenopathy.  Skin:    General: Skin is warm.     Coloration: Skin is not pale.  Neurological:     Mental Status: She is alert and oriented to person, place, and time.   Psychiatric:        Mood and Affect: Mood and affect normal.        Behavior: Behavior normal.    LABORATORY DATA:  I have reviewed the data as listed    Component Value Date/Time   NA 140 08/05/2023 0916   NA 141 01/25/2020 1530   K 3.1 (L) 08/05/2023 0916   CL 109 08/05/2023 0916   CO2 21 (L) 08/05/2023 0916   GLUCOSE 117 (H) 08/05/2023 0916   BUN 14 08/05/2023 0916   BUN 19 01/25/2020 1530   CREATININE 0.66 08/05/2023 0916   CALCIUM  8.5 (L) 08/05/2023 0916   PROT 6.8 08/05/2023 0916   ALBUMIN 4.2 08/05/2023 0916   AST 17 08/05/2023 0916   ALT 9 08/05/2023 0916   ALKPHOS 64 08/05/2023 0916   BILITOT 0.4 08/05/2023 0916   GFRNONAA >60 08/05/2023 0916   GFRAA NOT CALCULATED 04/17/2020 1352   Lab Results  Component Value Date   WBC 4.9 08/05/2023   NEUTROABS 2.4 08/05/2023   HGB 9.6 (L) 08/05/2023   HCT 31.5 (L) 08/05/2023   MCV 84.2 08/05/2023   PLT 223 08/05/2023      Chemistry      Component Value Date/Time   NA 140 08/05/2023 0916   NA 141 01/25/2020 1530   K 3.1 (L) 08/05/2023 0916   CL 109 08/05/2023 0916   CO2 21 (L) 08/05/2023 0916   BUN 14 08/05/2023 0916   BUN 19 01/25/2020 1530   CREATININE 0.66 08/05/2023 0916      Component Value Date/Time   CALCIUM  8.5 (L) 08/05/2023 0916   ALKPHOS 64 08/05/2023 0916   AST 17 08/05/2023 0916   ALT 9 08/05/2023 0916   BILITOT 0.4 08/05/2023 0916      05/25/23- Bone Marrow Report DIAGNOSIS:   BONE MARROW, ASPIRATE, CLOT, CORE:  -Very focal minimal residual lambda restricted plasma cells consistent with recurrent/residual known plasma cell myeloma   PERIPHERAL BLOOD:  -Normochromic normocytic anemia  -Borderline leukopenia   Note: The patient's history of plasma cell myeloma is noted.  There is very focal clusters of lambda restricted plasma cells seen only on areas of the biopsy.  Increased plasma cells are not identified on aspirate smear slides.  In addition, the plasma cells on the clot section are  all  scattered  polyclonal plasma cells.  The findings are consistent with focal minimal residual lambda restricted plasma cell neoplasm.   RADIOGRAPHIC STUDIES: I have personally reviewed the radiological images as listed and agreed with the findings in the report. No results found.   ASSESSMENT & PLAN:   Multiple myeloma not having achieved remission (HCC) # RECURRENT Multiple myeloma stage III [high-risk cytogenetics-status post KRD-VGPR ; status post autologous stem cell transplant on 06/15/18.   MAY 2024- 1.3. ] start 12/27/2022-Isa-Carfil-Dex [[facility]] OCT 24th, 2024- Bone marrow Biopsy: The findings are consistent with  focal minimal residual lambda restricted plasma cell neoplasm. HOLD since - oct 15th, 2024.    # Most recently on patient 12/27/2022-Isa-Dex; d-2carfil weekly q 28 days-currently on hold since May 10, 2023.  -Because of good response to therapy/ and Worsening glaucoma/Drop in EF-  MARCH 2024-Echo [[facility]] Left ventricular EF 50 to 55%; AUG 13th/OCT 23rd, 2024- EF-45-50%.  Recommend monitoring for now.  Continue maintenance therapy- at subsequent visit without steroids.  # Electrolyte:  Hypokalemia: mild- Continue Kdur TID. Refilled. Encouraged compliance.  hypocalcemia- 8.5- vit D SEP 2024- 50-  Continue Ergo 50k weekly. Restart Calcium  1200 mg twice a day.  Refilled. Encouraged compliance.    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 TSH September 2024 was 11- improved from 24 in August 2024. Refilled. Poor compliance.    # Glaucoma BIL- L > R [Dr. Myrna; [facility]] on eye drops. Followed by ophthalmology. Vision changes are stable.    # Lower back discomfort x 6 months-see above.  MRI lumbar spine as above.  Continue tramadol . stable   # Bone lesions /last zometa  on 11/27/2017. S/p Dental extraction PET scan evidence of any bone lesions. Hold Zometa  for now.stable   #  Infection prophylaxis: Acyclovir ; asprin- add IVIG infusion [IgG < 400]- plan  IVIG- q 4 w #1 on- 9/17- stable   # IV access: mediport- functional   # Vaccination: Flu shot [sep 2024] s/p Pneumonia vaccination [march 2024]- stable.    6 w- given dental extrac- on 1/26. :MM panel; K/l light chain ratio q 4 W   D-isa-dex;car1&2-car; weekly x 3; week 4-OFF;  q 28 day cycle; NO zometa - dental issues-ok; but hypocalemia   DISPOSITION: Zometa  held today. Deaccess.  4 weeks- port/labs (cbc, cmp, MM panel, K/L light chains, TSH), Dr Rennie, +/- zometa -  la  No problem-specific Assessment & Plan notes found for this encounter.  No orders of the defined types were placed in this encounter.  All questions were answered. The patient knows to call the clinic with any problems, questions or concerns.     Tinnie KANDICE Dawn, NP 08/05/2023

## 2023-08-06 ENCOUNTER — Other Ambulatory Visit: Payer: Self-pay

## 2023-08-06 LAB — THYROID PANEL WITH TSH
Free Thyroxine Index: 1.4 (ref 1.2–4.9)
T3 Uptake Ratio: 23 % — ABNORMAL LOW (ref 24–39)
T4, Total: 6.3 ug/dL (ref 4.5–12.0)
TSH: 14.2 u[IU]/mL — ABNORMAL HIGH (ref 0.450–4.500)

## 2023-08-08 LAB — KAPPA/LAMBDA LIGHT CHAINS
Kappa free light chain: 4.8 mg/L (ref 3.3–19.4)
Kappa, lambda light chain ratio: 0.18 — ABNORMAL LOW (ref 0.26–1.65)
Lambda free light chains: 27 mg/L — ABNORMAL HIGH (ref 5.7–26.3)

## 2023-08-12 LAB — MULTIPLE MYELOMA PANEL, SERUM
Albumin SerPl Elph-Mcnc: 4.1 g/dL (ref 2.9–4.4)
Albumin/Glob SerPl: 1.8 — ABNORMAL HIGH (ref 0.7–1.7)
Alpha 1: 0.1 g/dL (ref 0.0–0.4)
Alpha2 Glob SerPl Elph-Mcnc: 0.5 g/dL (ref 0.4–1.0)
B-Globulin SerPl Elph-Mcnc: 1.1 g/dL (ref 0.7–1.3)
Gamma Glob SerPl Elph-Mcnc: 0.5 g/dL (ref 0.4–1.8)
Globulin, Total: 2.3 g/dL (ref 2.2–3.9)
IgA: 172 mg/dL (ref 64–422)
IgG (Immunoglobin G), Serum: 582 mg/dL — ABNORMAL LOW (ref 586–1602)
IgM (Immunoglobulin M), Srm: <5 mg/dL — ABNORMAL LOW (ref 26–217)
Total Protein ELP: 6.4 g/dL (ref 6.0–8.5)

## 2023-08-17 ENCOUNTER — Telehealth: Payer: Self-pay | Admitting: Nurse Practitioner

## 2023-08-17 ENCOUNTER — Other Ambulatory Visit: Payer: Self-pay | Admitting: Nurse Practitioner

## 2023-08-17 DIAGNOSIS — R7989 Other specified abnormal findings of blood chemistry: Secondary | ICD-10-CM

## 2023-08-17 MED ORDER — LEVOTHYROXINE SODIUM 175 MCG PO TABS: 175.0000 ug | ORAL_TABLET | Freq: Every day | ORAL | 1 refills | Status: DC

## 2023-08-17 NOTE — Telephone Encounter (Signed)
TSH persistently elevated. Patient has reported poor compliance. Called her to revisit recommendations for electrolytes and levothyroxine medications. Left vm to return call.

## 2023-08-17 NOTE — Progress Notes (Signed)
Patient returned called. Reviewed dosing of medications. She says she has been compliant with thyroid medication, therefore we will increase dose to 175 mcg.

## 2023-08-22 ENCOUNTER — Telehealth: Payer: Self-pay | Admitting: *Deleted

## 2023-08-22 NOTE — Telephone Encounter (Signed)
Pt wants the community alternative program for the pt. I told her that we have 7 to 10 days and daughter wants call back to say it wen through

## 2023-08-24 ENCOUNTER — Emergency Department: Payer: 59

## 2023-08-24 ENCOUNTER — Encounter: Payer: Self-pay | Admitting: Internal Medicine

## 2023-08-24 ENCOUNTER — Other Ambulatory Visit: Payer: Self-pay

## 2023-08-24 ENCOUNTER — Telehealth: Payer: Self-pay | Admitting: *Deleted

## 2023-08-24 ENCOUNTER — Encounter: Payer: Self-pay | Admitting: *Deleted

## 2023-08-24 ENCOUNTER — Emergency Department
Admission: EM | Admit: 2023-08-24 | Discharge: 2023-08-24 | Discharge status: Against Medical Advice | Payer: 59 | Attending: Physician Assistant | Admitting: Physician Assistant

## 2023-08-24 DIAGNOSIS — J101 Influenza due to other identified influenza virus with other respiratory manifestations: Secondary | ICD-10-CM | POA: Insufficient documentation

## 2023-08-24 DIAGNOSIS — Z5321 Procedure and treatment not carried out due to patient leaving prior to being seen by health care provider: Secondary | ICD-10-CM | POA: Diagnosis not present

## 2023-08-24 DIAGNOSIS — R059 Cough, unspecified: Secondary | ICD-10-CM | POA: Diagnosis present

## 2023-08-24 DIAGNOSIS — Z20822 Contact with and (suspected) exposure to covid-19: Secondary | ICD-10-CM | POA: Diagnosis not present

## 2023-08-24 LAB — CBC
HCT: 34.4 % — ABNORMAL LOW (ref 36.0–46.0)
Hemoglobin: 10.8 g/dL — ABNORMAL LOW (ref 12.0–15.0)
MCH: 25.8 pg — ABNORMAL LOW (ref 26.0–34.0)
MCHC: 31.4 g/dL (ref 30.0–36.0)
MCV: 82.3 fL (ref 80.0–100.0)
Platelets: 153 10*3/uL (ref 150–400)
RBC: 4.18 MIL/uL (ref 3.87–5.11)
RDW: 16.1 % — ABNORMAL HIGH (ref 11.5–15.5)
WBC: 7.3 10*3/uL (ref 4.0–10.5)
nRBC: 0 % (ref 0.0–0.2)

## 2023-08-24 LAB — RESP PANEL BY RT-PCR (RSV, FLU A&B, COVID)  RVPGX2
Influenza A by PCR: POSITIVE — AB
Influenza B by PCR: NEGATIVE
Resp Syncytial Virus by PCR: NEGATIVE
SARS Coronavirus 2 by RT PCR: NEGATIVE

## 2023-08-24 LAB — BASIC METABOLIC PANEL
Anion gap: 14 (ref 5–15)
BUN: 16 mg/dL (ref 8–23)
CO2: 18 mmol/L — ABNORMAL LOW (ref 22–32)
Calcium: 8.3 mg/dL — ABNORMAL LOW (ref 8.9–10.3)
Chloride: 102 mmol/L (ref 98–111)
Creatinine, Ser: 0.78 mg/dL (ref 0.44–1.00)
GFR, Estimated: >60 mL/min (ref >60)
Glucose, Bld: 101 mg/dL — ABNORMAL HIGH (ref 70–99)
Potassium: 3.6 mmol/L (ref 3.5–5.1)
Sodium: 134 mmol/L — ABNORMAL LOW (ref 135–145)

## 2023-08-24 NOTE — ED Provider Triage Note (Signed)
Emergency Medicine Provider Triage Evaluation Note  Brandi [name] , a 73 y.o. female  was evaluated in triage.  Pt complains of dif breathing, wears 4L o2 at thome.  Review of Systems  Positive:  Negative:   Physical Exam  BP 113/66 (BP Location: Left Arm)   Pulse 96   Temp 98.6 F (37 C) (Oral)   Resp (!) 22   SpO2 96%  Gen:   Awake, no distress   Resp:  Normal effort  MSK:   Moves extremities without difficulty  Other:    Medical Decision Making  Medically screening exam initiated at 12:31 PM.  Appropriate orders placed.  FANNY AGAN was informed that the remainder of the evaluation will be completed by another provider, this initial triage assessment does not replace that evaluation, and the importance of remaining in the ED until their evaluation is complete.     Faythe Ghee, PA-C 08/24/23 1231

## 2023-08-24 NOTE — ED Notes (Signed)
Pt with wet cough, spo2 96% on arrival to triage, decreased to 88% on RA.  Placed on 2L [place]. Pt has home O2 which she uses prn up to 4L [place] per pt.

## 2023-08-24 NOTE — ED Notes (Signed)
Patient called x3 with no answer from lobby.

## 2023-08-24 NOTE — Telephone Encounter (Signed)
I called the daughter and told her that all Brandi [name] only needed NPI and she did the paper

## 2023-08-24 NOTE — Telephone Encounter (Signed)
I called the daughter to  see what parts of form needs to be filled. The daughter gaveme the person that hel;ps with papers and it was [place] and [phone]. I called her  Darol Destine said that if she has NPI for Dr. Vevelyn Pat then she will it it out . The thanked me to helping so the pt. Will get her needs done

## 2023-08-24 NOTE — ED Triage Notes (Signed)
Pt reports that she thinks she has the flu.  She was in close contact with someone that had the flu and she has had URI with cough and cold and chills and body aches for over a week.

## 2023-08-25 DIAGNOSIS — R531 Weakness: Secondary | ICD-10-CM | POA: Insufficient documentation

## 2023-08-25 DIAGNOSIS — Z5321 Procedure and treatment not carried out due to patient leaving prior to being seen by health care provider: Secondary | ICD-10-CM | POA: Diagnosis not present

## 2023-08-25 NOTE — ED Provider Triage Note (Signed)
Emergency Medicine Provider Triage Evaluation Note  Brandi [name] , a 73 y.o. female  was evaluated in triage.  Pt complains of feeling weak and tired. She has multiple myeloma. She was here yesterday and stayed about 5 hours, but felt so bad she just went home before being seen.[name]  Physical Exam  BP 106/66 (BP Location: Right Arm)   Pulse 81   Temp 97.7 F (36.5 C) (Oral)   Resp 18   Ht 5\' 2"  (1.575 m)   Wt 75 kg   SpO2 96%   BMI 30.24 kg/m  Gen:   Awake, no distress   Resp:  Normal effort  MSK:   Moves extremities without difficulty  Other:    Medical Decision Making  Medically screening exam initiated at 3:39 PM.  Appropriate orders placed.  [name] was informed that the remainder of the evaluation will be completed by another provider, this initial triage assessment does not replace that evaluation, and the importance of remaining in the ED until their evaluation is complete.  Influenza A positive yesterday. No additional testing ordered.   [name], FNP 08/25/23 1542

## 2023-08-25 NOTE — ED Triage Notes (Signed)
Arrives today, + flu yesterday.  States  I feel weak. I have no appetite.    AAOx3.  Skin warm and dry. NAD

## 2023-08-26 ENCOUNTER — Emergency Department
Admission: EM | Admit: 2023-08-26 | Discharge: 2023-08-26 | Discharge status: Against Medical Advice | Payer: 59 | Attending: Emergency Medicine | Admitting: Emergency Medicine

## 2023-08-26 NOTE — ED Notes (Signed)
 No answer when called several times from lobby

## 2023-08-27 ENCOUNTER — Encounter: Payer: Self-pay | Admitting: Internal Medicine

## 2023-08-31 ENCOUNTER — Other Ambulatory Visit: Payer: Self-pay | Admitting: *Deleted

## 2023-08-31 DIAGNOSIS — C9000 Multiple myeloma not having achieved remission: Secondary | ICD-10-CM

## 2023-09-02 ENCOUNTER — Inpatient Hospital Stay: Payer: 59

## 2023-09-02 ENCOUNTER — Other Ambulatory Visit: Payer: Self-pay

## 2023-09-02 ENCOUNTER — Inpatient Hospital Stay: Payer: 59 | Admitting: Internal Medicine

## 2023-09-06 ENCOUNTER — Telehealth: Payer: Self-pay | Admitting: *Deleted

## 2023-09-06 NOTE — Telephone Encounter (Signed)
Called the daughter and let her know that the form was filled out, md signed and I sent the meds per the request that Dr. Donneta Romberg  gave the meds and last note.the fax went through

## 2023-09-07 ENCOUNTER — Telehealth: Payer: Self-pay | Admitting: *Deleted

## 2023-09-07 NOTE — Telephone Encounter (Signed)
I got a message that Alecia told me there was 1 thing that was not right and she asked to call me and I fixed it and sent message to [facility] that check mark is now yes,

## 2023-09-12 ENCOUNTER — Encounter: Payer: Self-pay | Admitting: Internal Medicine

## 2023-09-12 ENCOUNTER — Inpatient Hospital Stay: Payer: 59 | Attending: Nurse Practitioner

## 2023-09-12 ENCOUNTER — Inpatient Hospital Stay (HOSPITAL_BASED_OUTPATIENT_CLINIC_OR_DEPARTMENT_OTHER): Payer: 59 | Admitting: Internal Medicine

## 2023-09-12 VITALS — BP 126/50 | HR 82 | Temp 97.2°F | Resp 16 | Wt 159.0 lb

## 2023-09-12 DIAGNOSIS — C9000 Multiple myeloma not having achieved remission: Secondary | ICD-10-CM

## 2023-09-12 DIAGNOSIS — Z79899 Other long term (current) drug therapy: Secondary | ICD-10-CM | POA: Diagnosis not present

## 2023-09-12 DIAGNOSIS — C9002 Multiple myeloma in relapse: Secondary | ICD-10-CM | POA: Diagnosis present

## 2023-09-12 DIAGNOSIS — Z87891 Personal history of nicotine dependence: Secondary | ICD-10-CM | POA: Diagnosis not present

## 2023-09-12 DIAGNOSIS — Z5181 Encounter for therapeutic drug level monitoring: Secondary | ICD-10-CM

## 2023-09-12 DIAGNOSIS — E032 Hypothyroidism due to medicaments and other exogenous substances: Secondary | ICD-10-CM | POA: Diagnosis not present

## 2023-09-12 LAB — CMP (CANCER CENTER ONLY)
ALT: 7 U/L (ref 0–44)
AST: 11 U/L — ABNORMAL LOW (ref 15–41)
Albumin: 3.9 g/dL (ref 3.5–5.0)
Alkaline Phosphatase: 82 U/L (ref 38–126)
Anion gap: 8 (ref 5–15)
BUN: 15 mg/dL (ref 8–23)
CO2: 24 mmol/L (ref 22–32)
Calcium: 8.8 mg/dL — ABNORMAL LOW (ref 8.9–10.3)
Chloride: 106 mmol/L (ref 98–111)
Creatinine: 0.87 mg/dL (ref 0.44–1.00)
GFR, Estimated: >60 mL/min (ref >60)
Glucose, Bld: 89 mg/dL (ref 70–99)
Potassium: 4 mmol/L (ref 3.5–5.1)
Sodium: 138 mmol/L (ref 135–145)
Total Bilirubin: 0.5 mg/dL (ref 0.0–1.2)
Total Protein: 7.2 g/dL (ref 6.5–8.1)

## 2023-09-12 LAB — CBC WITH DIFFERENTIAL (CANCER CENTER ONLY)
Abs Immature Granulocytes: 0.01 10*3/uL (ref 0.00–0.07)
Basophils Absolute: 0 10*3/uL (ref 0.0–0.1)
Basophils Relative: 1 %
Eosinophils Absolute: 0.1 10*3/uL (ref 0.0–0.5)
Eosinophils Relative: 2 %
HCT: 30.7 % — ABNORMAL LOW (ref 36.0–46.0)
Hemoglobin: 9.6 g/dL — ABNORMAL LOW (ref 12.0–15.0)
Immature Granulocytes: 0 %
Lymphocytes Relative: 46 %
Lymphs Abs: 2.3 10*3/uL (ref 0.7–4.0)
MCH: 25.9 pg — ABNORMAL LOW (ref 26.0–34.0)
MCHC: 31.3 g/dL (ref 30.0–36.0)
MCV: 82.7 fL (ref 80.0–100.0)
Monocytes Absolute: 0.4 10*3/uL (ref 0.1–1.0)
Monocytes Relative: 8 %
Neutro Abs: 2.1 10*3/uL (ref 1.7–7.7)
Neutrophils Relative %: 43 %
Platelet Count: 334 10*3/uL (ref 150–400)
RBC: 3.71 MIL/uL — ABNORMAL LOW (ref 3.87–5.11)
RDW: 17.1 % — ABNORMAL HIGH (ref 11.5–15.5)
WBC Count: 4.9 10*3/uL (ref 4.0–10.5)
nRBC: 0 % (ref 0.0–0.2)

## 2023-09-12 LAB — TSH: TSH: 5.217 u[IU]/mL — ABNORMAL HIGH (ref 0.350–4.500)

## 2023-09-12 MED ORDER — HEPARIN SOD (PORK) LOCK FLUSH 100 UNIT/ML IV SOLN
500.0000 [IU] | Freq: Once | INTRAVENOUS | Status: AC
  Administered 2023-09-12: 500 [IU] via INTRAVENOUS
  Filled 2023-09-12: qty 5

## 2023-09-12 NOTE — Assessment & Plan Note (Addendum)
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   MAY 2024- 1.3. ] start 12/27/2022-Isa-Carfil-Dex [[facility]] OCT 24th, 2024- Bone marrow Biopsy: The findings are consistent with  focal minimal residual lambda restricted plasma cell neoplasm.  Most recently on patient 12/27/2022-Isa-Dex; d-2 carfil weekly q 28 days-currently on HOLD ince May 10, 2023.  -Because of good response to therapy/ and Worsening glaucoma/Drop in EF.   # JAN 2025- M-protein negative; kappa lambda light chain ratio-1.8 lambda light chain 24.   # Recommend restarting therapy without steroids- in next few weeks.   # DROP in EF:  MARCH 2024-Echo [[facility]] Left ventricular EF 50 to 55%; AUG 13th/OCT 23rd, 2024- EF-45-50%.  Recommend monitoring for now.   # Anemia-Hb 9-10-  I ron sat- 8%; ferritin- 19 Proceed with venofer at next visit.    # Electrolyte: Hypokalemia: mild-  continue Kdur TID; hypocalcemia- 8.0- vit D SEP 2024- 50- continie Ergo 50k weekly. REcommend ca BID    sep 2024- Vit D 54. Stable.   # Iatrogenic hypothyroidism [ Graves/goiter s/p total thyroidectomy;aug,2022 ]-currently on Synthroid 150 mcg/day-[increased in AUG, 2024-    # Glaucoma BIL- L > R [Dr.King; [facility]] - ? Steroid induced on eye drops. decreased the steroids.  Discussed with ophthalmology- HOLD chemo for now. .  # Lower back discomfort x 6 months-see above.  MRI lumbar spine as above.  Continue tramadol- refilled . stable  # Bone lesions /last zometa on 11/27/2017.  S/p   Dental extraction PET scan evidence of any bone lesions.- consider Zometa  at next visit.   # Infection prophylaxis: Acyclovir; asprin- add IVIG infusion [IgG < 400]- plan  IVIG- q 4 w #1 on- 9/17- stable; refilled   # IV access: mediport- functional  # Vaccination: Flu shot today [sep 2024] s/p Pneumonia vaccination [march 2024]- stable.  Recommend RSV vaccination.   6 w- given  dental extrac- on 1/26. :MM panel; K/l light chain ratio q 4 W  D-isa-dex;car1&2-car; weekly x 3; week 4-OFF;  q 28 day cycle; NO zometa- dental issues-ok; but hypocalemia  # DISPOSITION: # 2 d ECHO ASAP # in 4 weeks MD port/labs-cbc;cmp; MM panel; k/l light chains; thyroid profile- Chemo; possible Zometa; venofer  # D-2 chemo #  5 weeks MD port/labs-cbc;cmp;  Chemo;D-2 chemo # 6  weeks MD port/labs-cbc;cmp; Chemo;D-2 chemo- Dr.B

## 2023-09-12 NOTE — Patient Instructions (Signed)
 Recommend RSV vaccination

## 2023-09-12 NOTE — Progress Notes (Signed)
[name] OFFICE PROGRESS NOTE  Patient Care Team: Leanna Sato, MD as PCP - General (Family Medicine) End, Cristal Deer, MD as PCP - Cardiology (Cardiology) Eddie Candle, [name] Earna Coder, MD as Consulting Physician (Hematology and Oncology) Vida Rigger, MD as Consulting Physician (Pulmonary Disease) Tedd Sias Marlana Salvage, MD as Consulting Physician (Endocrinology)   Cancer Staging  Multiple myeloma not having achieved remission [name]) Staging form: Plasma Cell Myeloma and Plasma Cell Disorders, AJCC 8th Edition - Clinical: No stage assigned - Unsigned - Clinical: No stage assigned - Unsigned   Oncology History Overview Note  # SEP 2018- MULTIPLE MYELOMA IgALamda [2.5 gm/dl; K/L= 16/1096]; STAGE III [beta 2 microglobulin=5.5] [presented with acute renal failure; anemia; NO hypercalcemia; Skeletal survey-Normal]; BMBx- 45% plasma cells; FISH-POSITIVE 11:14 translocation.[STANDARD-high RISK]/cyto-Normal; SEP 2018- PET- L3 posterior element lesion.   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 No morphologic support for residual myeloma disease. Negative for minimal residual disease by MM-MRD flow cytometry; HOLD REVLIMID [leg swelling]; AUG 2021-PET scan negative for myeloma; continue to hold Revlimid  # MARCH 2021-diastolic congestive heart failure [Dr.End]  # BMBx- OCT 2021- BMBx- 5% plasmacytosis-however this appears to be  more polyclonal rather than monoclonal-not explain patient worsening anemia.   # OCT 2022- 18th- Dara- Rev-Dex; MARCH, 2024- PROGRESSION bone marrow biopsy 70% plasma cells; PET scan no bone lesions-however conus medullaris uptake-MRI lumbar spine-   # MAY 2024- DISCONTINUE carfilzomib-dexamethasone- Pomalyst; [rising M-protein; MAY 2024- 1.3. ]   # start 12/27/2022 -Isa-Carfil-Dex;  [decrease dexamethasone 20 weekly- starting 05/04/23- sec to glaucoma].   # OCT 24th, 2024- Bone marrow Biopsy: The findings are consistent with  focal minimal residual lambda restricted plasma cell neoplasm- HOLD treatment sec to glaucoma; and drop in EF AUG 2024- 45-50% [March 2024- 50-55%]  #November 2021-hyperthyroidism/goiter- [D.Bennett/Dr.Solum]-methimazole. S/p Thyroidectomy [Dr.Kim; [facility]-AUG 2022]  --------------------------  # 12/12- RIGHT JUGULAR DVT-x 52m on xarelto; finished April 2020; September 2020-EGD/dysphagia; Dr. Tobi Bastos  # Acute renal failure [Dr.Singh; Proteinuria 1.5gm/day ]; acyclovir/Asprin ------------------------------------------------------------------------------------------------------------   DIAGNOSIS: [ ]  MULTIPLE MYELOMA  STAGE: III/HIGH RISK ;GOALS: CONTROL      Multiple myeloma not having achieved remission (HCC)  11/21/2017 - 04/28/2018 Chemotherapy   Patient is on Treatment Plan : MYELOMA SALVAGE Cyclophosphamide / Carfilzomib / Dexamethasone (CCd) q28d     05/12/2021 - 02/25/2022 Chemotherapy   Patient is on Treatment Plan : MYELOMA RELAPSED REFRACTORY Daratumumab SQ + Lenalidomide + Dexamethasone (DaraRd) q28d     05/12/2021 - 07/23/2022 Chemotherapy   Patient is on Treatment Plan : MYELOMA RELAPSED REFRACTORY Daratumumab SQ + Lenalidomide + Dexamethasone (DaraRd) q28d     11/02/2022 - 11/16/2022 Chemotherapy   Patient is on Treatment Plan : MYELOMA RELAPSED/ REFRACTORY Carfilzomib D1,8,15 (20/27) + Pomalidomide + Dexamethasone (KPd) q28d     12/27/2022 -  Chemotherapy    Patient is on Treatment Plan : MYELOMA Carfilzomib + Dexamethasone + Isatuximab q28d      INTERVAL HISTORY: Ambulating  independently.   Alone.  [name] 73 y.o.  female pleasant patient above history of relapsed multiple myeloma--most recently on  Isa- Carfil-Dexcurrently on HOLD sec to Glaucoma [? Steroid induced] is here for a follow up.  Pt in for follow up, reports recently had "flu and pneumonia appx 3 weeks ago- hospitalized. Feels better now.    Patient status post evaluation with ophthalmology.  States her vision not any worse; and denies any eye pain.   Patient denies any pain. Appetite is good. No  dyspnea . Mild fatigue.  No fevers or chills. No infections. Denies any worsening pain.  Patient taking tramadol as needed for upper and leg cramps. Mild tingling in feet.    Review of Systems  Constitutional:  Positive for malaise/fatigue. Negative for chills, diaphoresis and fever.  HENT:  Negative for nosebleeds and sore throat.   Eyes:  Negative for double vision.  Respiratory:  Negative for hemoptysis.   Cardiovascular:  Negative for chest pain, palpitations and orthopnea.  Gastrointestinal:  Negative for abdominal pain, blood in stool, constipation, heartburn and melena.  Genitourinary:  Negative for dysuria, frequency and urgency.  Musculoskeletal:  Positive for back pain.  Skin: Negative.  Negative for itching and rash.  Neurological:  Negative for dizziness, tingling, focal weakness, weakness and headaches.  Endo/Heme/Allergies:  Does not bruise/bleed easily.  Psychiatric/Behavioral:  Negative for depression. The patient is not nervous/anxious.     PAST MEDICAL HISTORY :  Past Medical History:  Diagnosis Date   (HFpEF) heart failure with preserved ejection fraction (HCC)    a. 06/2020 Echo: EF 60-65%, no rwma, nl RV size/fxn. Triv MR. Triv TR/AI. Mod elev PASP.   Anxiety    COPD (chronic obstructive pulmonary disease) (HCC)    GERD (gastroesophageal reflux  disease)    Hypertension    Hyperthyroidism    Hypokalemia    IgA myeloma (HCC)    Multiple myeloma (HCC)    Pneumonia    Red blood cell antibody positive, compatible PRBC difficult to obtain    S/P autologous bone marrow transplantation (HCC)     PAST SURGICAL HISTORY :   Past Surgical History:  Procedure Laterality Date   ANTERIOR VITRECTOMY Left 10/07/2021   Procedure: ANTERIOR VITRECTOMY;  Surgeon: Lockie Mola, MD;  Location: [facility];  Service: Ophthalmology;  Laterality: Left;   CATARACT EXTRACTION W/PHACO Left 10/07/2021   Procedure: CATARACT EXTRACTION PHACO AND INTRAOCULAR LENS PLACEMENT (IOC) LEFT VISION BLUE;  Surgeon: Lockie Mola, MD;  Location: [facility];  Service: Ophthalmology;  Laterality: Left;  kit for manual incision 14.56 01:21.4   CATARACT EXTRACTION W/PHACO Right 10/21/2021   Procedure: CATARACT EXTRACTION PHACO AND INTRAOCULAR LENS PLACEMENT (IOC) RIGHT 11.12 01:48.1;  Surgeon: Lockie Mola, MD;  Location: [facility];  Service: Ophthalmology;  Laterality: Right;   ESOPHAGOGASTRODUODENOSCOPY (EGD) WITH PROPOFOL N/A 03/30/2019   Procedure: ESOPHAGOGASTRODUODENOSCOPY (EGD) WITH PROPOFOL;  Surgeon: Wyline Mood, MD;  Location[name];  Service: Gastroenterology;  Laterality: N/A;   IR BONE MARROW BIOPSY & ASPIRATION  05/25/2023   IR FLUORO GUIDE PORT INSERTION RIGHT  12/16/2017   IR IMAGING GUIDED PORT INSERTION  06/15/2021   KNEE ARTHROSCOPY Left 09/03/2022   Procedure: ARTHROSCOPY KNEE DEBRIDEMENT;  Surgeon: Lyndle Herrlich, MD;  Location: [facility];  Service: Orthopedics;  Laterality: Left;   THYROIDECTOMY N/A     FAMILY HISTORY :   Family History  Problem Relation Age of Onset   Pneumonia Mother    Seizures Father     SOCIAL HISTORY:   Social History  Tobacco Use   Smoking status: Former    Current packs/day: 0.00    Types: Cigarettes    Quit date: 03/18/2021    Years since quitting: 2.4    Smokeless tobacco: Never   Tobacco comments:    2 cigarettes QOD  Vaping Use   Vaping status: Never Used  Substance Use Topics   Alcohol use: No   Drug use: No    ALLERGIES:  has no known allergies.  MEDICATIONS:  Current Outpatient Medications  Medication Sig Dispense Refill   acyclovir (ZOVIRAX) 400 MG tablet Take 1 tablet (400 mg total) by mouth 2 (two) times daily. 60 tablet 6   albuterol (VENTOLIN HFA) 108 (90 Base) MCG/ACT inhaler Inhale 2 puffs into the lungs every 6 (six) hours as needed for wheezing or shortness of breath. 8 g 3   aspirin EC 81 MG EC tablet Take 1 tablet (81 mg total) by mouth daily. 90 tablet 3   brimonidine (ALPHAGAN P) 0.1 % SOLN Place 1 drop into both eyes 2 (two) times daily.     Brinzolamide-Brimonidine (SIMBRINZA) 1-0.2 % SUSP Apply 1 drop to eye in the morning and at bedtime. I drop both eyes twice a day     calcium carbonate (CALCIUM 600 HIGH POTENCY) 600 MG TABS tablet Take 2 tablets (1,200 mg total) by mouth 2 (two) times daily. 120 tablet 2   carvedilol (COREG) 3.125 MG tablet Take 1 tablet (3.125 mg total) by mouth 2 (two) times daily with a meal. 60 tablet 5   furosemide (LASIX) 40 MG tablet Take 1 tablet (40 mg total) by mouth daily. 90 tablet 3   Latanoprostene Bunod (VYZULTA) 0.024 % SOLN Apply 1 drop to eye at bedtime. 1 drop Left eye every night     levothyroxine (SYNTHROID) 175 MCG tablet Take 1 tablet (175 mcg total) by mouth daily. For thyroid 90 tablet 1   lidocaine-prilocaine (EMLA) cream Apply 1 application topically as needed. Apply to port and cover with saran wrap 1-2 hours prior to port access 30 g 1   magnesium oxide (MAG-OX) 400 (240 Mg) MG tablet Take 1 tablet (400 mg total) by mouth 2 (two) times daily. 60 tablet 3   montelukast (SINGULAIR) 10 MG tablet Take 1 tablet (10 mg total) by mouth at bedtime. 30 tablet 3   ondansetron (ZOFRAN) 8 MG tablet One pill every 8 hours as needed for nausea/vomitting. 40 tablet 1   pantoprazole  (PROTONIX) 40 MG tablet Take 1 tablet (40 mg total) by mouth daily. 90 tablet 0   polyethylene glycol (MIRALAX / GLYCOLAX) packet Take 17 g by mouth daily as needed for mild constipation. 14 each 0   potassium chloride SA (KLOR-CON M) 20 MEQ tablet Take 3 tablets (60 mEq total) by mouth daily. For low potassium 90 tablet 2   spironolactone (ALDACTONE) 25 MG tablet Take 1 tablet by mouth once daily 90 tablet 3   traMADol (ULTRAM) 50 MG tablet Take 1 tablet (50 mg total) by mouth every 12 (twelve) hours as needed. 60 tablet 1   Vitamin D, Ergocalciferol, (DRISDOL) 1.25 MG (50000 UNIT) CAPS capsule Take 1 capsule (50,000 Units total) by mouth once a week. 12 capsule 0   No current facility-administered medications for this visit.   Facility-Administered Medications Ordered in Other Visits  Medication Dose Route Frequency Provider Last Rate Last Admin   0.9 %  sodium chloride infusion   Intravenous Continuous Ralene Muskrat, PA-C  PHYSICAL EXAMINATION:   BP (!) 126/50 (BP Location: Left Arm, Patient Position: Sitting)   Pulse 82   Temp (!) 97.2 F (36.2 C) (Tympanic)   Resp 16   Wt 159 lb (72.1 kg)   SpO2 100%   BMI 29.08 kg/m   Filed Weights   09/12/23 0954  Weight: 159 lb (72.1 kg)       Mild leg swelling.   Physical Exam HENT:     Head: Normocephalic and atraumatic.  Eyes:     Pupils: Pupils are equal, round, and reactive to light.  Cardiovascular:     Rate and Rhythm: Normal rate and regular rhythm.  Pulmonary:     Effort: Pulmonary effort is normal. No respiratory distress.     Breath sounds: Normal breath sounds. No wheezing.  Abdominal:     General: Bowel sounds are normal. There is no distension.     Palpations: Abdomen is soft. There is no mass.     Tenderness: There is no abdominal tenderness. There is no guarding or rebound.  Musculoskeletal:        General: No tenderness. Normal range of motion.     Cervical back: Normal range of motion and neck  supple.  Skin:    General: Skin is warm.  Neurological:     Mental Status: She is alert and oriented to person, place, and time.  Psychiatric:        Mood and Affect: Affect normal.    LABORATORY DATA:  I have reviewed the data as listed    Component Value Date/Time   NA 138 09/12/2023 0929   NA 141 01/25/2020 1530   K 4.0 09/12/2023 0929   CL 106 09/12/2023 0929   CO2 24 09/12/2023 0929   GLUCOSE 89 09/12/2023 0929   BUN 15 09/12/2023 0929   BUN 19 01/25/2020 1530   CREATININE 0.87 09/12/2023 0929   CALCIUM 8.8 (L) 09/12/2023 0929   PROT 7.2 09/12/2023 0929   ALBUMIN 3.9 09/12/2023 0929   AST 11 (L) 09/12/2023 0929   ALT 7 09/12/2023 0929   ALKPHOS 82 09/12/2023 0929   BILITOT 0.5 09/12/2023 0929   GFRNONAA >60 09/12/2023 0929   GFRAA NOT CALCULATED 04/17/2020 1352    No results found for: "SPEP", "UPEP"  Lab Results  Component Value Date   WBC 4.9 09/12/2023   NEUTROABS 2.1 09/12/2023   HGB 9.6 (L) 09/12/2023   HCT 30.7 (L) 09/12/2023   MCV 82.7 09/12/2023   PLT 334 09/12/2023      Chemistry      Component Value Date/Time   NA 138 09/12/2023 0929   NA 141 01/25/2020 1530   K 4.0 09/12/2023 0929   CL 106 09/12/2023 0929   CO2 24 09/12/2023 0929   BUN 15 09/12/2023 0929   BUN 19 01/25/2020 1530   CREATININE 0.87 09/12/2023 0929      Component Value Date/Time   CALCIUM 8.8 (L) 09/12/2023 0929   ALKPHOS 82 09/12/2023 0929   AST 11 (L) 09/12/2023 0929   ALT 7 09/12/2023 0929   BILITOT 0.5 09/12/2023 0929      (H): Data is abnormally high   Latest Reference Range & Units Most Recent 12/24/20 14:01 04/01/21 14:34 04/28/21 09:17 06/11/21 09:42 07/03/21 10:32 08/20/21 10:06  M Protein SerPl Elph-Mcnc Not Observed g/dL 1.1 (H) (C) 11/02/79 19:14 Not Observed (C) 0.6 (H) (C) 0.7 (H) (C) 0.5 (H) (C) 0.7 (H) (C) 1.1 (H) (C)    Latest Reference Range &  Units Most Recent 09/17/21 08:47 10/15/21 08:25 11/12/21 08:55  Kappa free light chain 3.3 - 19.4 mg/L  5.0 11/12/21 08:55 7.2 7.2 5.0  Lambda free light chains 5.7 - 26.3 mg/L 223.6 (H) 11/12/21 08:55 215.3 (H) 340.2 (H) 223.6 (H)  Kappa, lambda light chain ratio 0.26 - 1.65  0.02 (L) 11/12/21 08:55 0.03 (L) 0.02 (L) 0.02 (L)  (H): Data is abnormally high (L): Data is abnormally low  RADIOGRAPHIC STUDIES: I have personally reviewed the radiological images as listed and agreed with the findings in the report. No results found.   ASSESSMENT & PLAN:  Multiple myeloma not having achieved remission (HCC) # RECURRENT Multiple myeloma stage III [high-risk cytogenetics-status post KRD-VGPR ; status post autologous stem cell transplant on 06/15/18.   MAY 2024- 1.3. ] start 12/27/2022-Isa-Carfil-Dex [[facility]] OCT 24th, 2024- Bone marrow Biopsy: The findings are consistent with  focal minimal residual lambda restricted plasma cell neoplasm.  Most recently on patient 12/27/2022-Isa-Dex; d-2 carfil weekly q 28 days-currently on HOLD ince May 10, 2023.  -Because of good response to therapy/ and Worsening glaucoma/Drop in EF.   # JAN 2025- M-protein negative; kappa lambda light chain ratio-1.8 lambda light chain 24.   # Recommend restarting therapy without steroids- in next few weeks.   # DROP in EF:  MARCH 2024-Echo [[facility]] Left ventricular EF 50 to 55%; AUG 13th/OCT 23rd, 2024- EF-45-50%.  Recommend monitoring for now.   # Anemia-Hb 9-10-  I ron sat- 8%; ferritin- 19 Proceed with venofer at next visit.    # Electrolyte: Hypokalemia: mild-  continue Kdur TID; hypocalcemia- 8.0- vit D SEP 2024- 50- continie Ergo 50k weekly. REcommend ca BID    sep 2024- Vit D 54. Stable.   # Iatrogenic hypothyroidism [ Graves/goiter s/p total thyroidectomy;aug,2022 ]-currently on Synthroid 150 mcg/day-[increased in AUG, 2024-    # Glaucoma BIL- L > R [Dr.King; [facility]] - ? Steroid induced on eye drops. decreased the steroids.  Discussed with ophthalmology- HOLD chemo for now. .  # Lower back discomfort x 6 months-see  above.  MRI lumbar spine as above.  Continue tramadol- refilled . stable  # Bone lesions /last zometa on 11/27/2017.  S/p   Dental extraction PET scan evidence of any bone lesions.- consider Zometa  at next visit.   # Infection prophylaxis: Acyclovir; asprin- add IVIG infusion [IgG < 400]- plan  IVIG- q 4 w #1 on- 9/17- stable; refilled   # IV access: mediport- functional  # Vaccination: Flu shot today [sep 2024] s/p Pneumonia vaccination [march 2024]- stable.  Recommend RSV vaccination.   6 w- given dental extrac- on 1/26. :MM panel; K/l light chain ratio q 4 W  D-isa-dex;car1&2-car; weekly x 3; week 4-OFF;  q 28 day cycle; NO zometa- dental issues-ok; but hypocalemia  # DISPOSITION: # 2 d ECHO ASAP # in 4 weeks MD port/labs-cbc;cmp; MM panel; k/l light chains; thyroid profile- Chemo; possible Zometa; venofer  # D-2 chemo #  5 weeks MD port/labs-cbc;cmp;  Chemo;D-2 chemo # 6  weeks MD port/labs-cbc;cmp; Chemo;D-2 chemo- Dr.B   Orders Placed This Encounter  Procedures   CBC with Differential ([name] Only)    Standing Status:   Future    Expected Date:   10/10/2023    Expiration Date:   10/09/2024   CMP ([name] only)    Standing Status:   Future    Expected Date:   10/10/2023    Expiration Date:   10/09/2024   CBC with Differential (  [name] Only)    Standing Status:   Future    Expected Date:   10/17/2023    Expiration Date:   10/16/2024   CMP ([name] only)    Standing Status:   Future    Expected Date:   10/17/2023    Expiration Date:   10/16/2024   CBC with Differential ([name] Only)    Standing Status:   Future    Expected Date:   10/24/2023    Expiration Date:   10/23/2024   CMP ([name] only)    Standing Status:   Future    Expected Date:   10/24/2023    Expiration Date:   10/23/2024   Kappa/lambda light chains    Standing Status:   Future    Expected Date:   10/10/2023    Expiration Date:   09/11/2024   Multiple Myeloma Panel (SPEP&IFE  w/QIG)    Standing Status:   Future    Expected Date:   10/10/2023    Expiration Date:   09/11/2024   Thyroid Panel With TSH    Standing Status:   Future    Expected Date:   10/10/2023    Expiration Date:   09/11/2024   ECHOCARDIOGRAM COMPLETE    Standing Status:   Future    Expiration Date:   09/11/2024    Where should this test be performed:   [facility]    Please indicate who you request to read the nuc med / echo results.:   [name]    Perflutren DEFINITY (image enhancing agent) should be administered unless hypersensitivity or allergy exist:   Administer Perflutren    Reason for exam-Echo:   Chemo  Z09   All questions were answered. The patient knows to call the clinic with any problems, questions or concerns.      Earna Coder, MD 09/12/2023 10:44 AM End

## 2023-09-12 NOTE — Progress Notes (Signed)
Pt in for follow up, reports recently had "flu and pneumonia" .

## 2023-09-13 ENCOUNTER — Other Ambulatory Visit: Payer: Self-pay

## 2023-09-13 LAB — KAPPA/LAMBDA LIGHT CHAINS
Kappa free light chain: 5.7 mg/L (ref 3.3–19.4)
Kappa, lambda light chain ratio: 0.13 — ABNORMAL LOW (ref 0.26–1.65)
Lambda free light chains: 43.6 mg/L — ABNORMAL HIGH (ref 5.7–26.3)

## 2023-09-14 LAB — MULTIPLE MYELOMA PANEL, SERUM
Albumin SerPl Elph-Mcnc: 3.9 g/dL (ref 2.9–4.4)
Albumin/Glob SerPl: 1.6 (ref 0.7–1.7)
Alpha 1: 0.1 g/dL (ref 0.0–0.4)
Alpha2 Glob SerPl Elph-Mcnc: 0.8 g/dL (ref 0.4–1.0)
B-Globulin SerPl Elph-Mcnc: 1.2 g/dL (ref 0.7–1.3)
Gamma Glob SerPl Elph-Mcnc: 0.5 g/dL (ref 0.4–1.8)
Globulin, Total: 2.6 g/dL (ref 2.2–3.9)
IgA: 247 mg/dL (ref 64–422)
IgG (Immunoglobin G), Serum: 551 mg/dL — ABNORMAL LOW (ref 586–1602)
IgM (Immunoglobulin M), Srm: 10 mg/dL — ABNORMAL LOW (ref 26–217)
M Protein SerPl Elph-Mcnc: 0.3 g/dL — ABNORMAL HIGH
Total Protein ELP: 6.5 g/dL (ref 6.0–8.5)

## 2023-09-19 ENCOUNTER — Ambulatory Visit
Admission: RE | Admit: 2023-09-19 | Discharge: 2023-09-19 | Disposition: A | Discharge status: Home / Self Care | Payer: 59 | Source: Ambulatory Visit | Attending: Internal Medicine

## 2023-09-19 DIAGNOSIS — J449 Chronic obstructive pulmonary disease, unspecified: Secondary | ICD-10-CM | POA: Diagnosis not present

## 2023-09-19 DIAGNOSIS — F419 Anxiety disorder, unspecified: Secondary | ICD-10-CM | POA: Diagnosis not present

## 2023-09-19 DIAGNOSIS — Z79899 Other long term (current) drug therapy: Secondary | ICD-10-CM | POA: Insufficient documentation

## 2023-09-19 DIAGNOSIS — C9000 Multiple myeloma not having achieved remission: Secondary | ICD-10-CM | POA: Insufficient documentation

## 2023-09-19 DIAGNOSIS — I081 Rheumatic disorders of both mitral and tricuspid valves: Secondary | ICD-10-CM | POA: Insufficient documentation

## 2023-09-19 DIAGNOSIS — I10 Essential (primary) hypertension: Secondary | ICD-10-CM | POA: Diagnosis not present

## 2023-09-19 DIAGNOSIS — Z5181 Encounter for therapeutic drug level monitoring: Secondary | ICD-10-CM | POA: Insufficient documentation

## 2023-09-19 LAB — ECHOCARDIOGRAM COMPLETE
AR max vel: 2.48 cm2
AV Area VTI: 2.68 cm2
AV Area mean vel: 2.4 cm2
AV Mean grad: 3 mm[Hg]
AV Peak grad: 4.8 mm[Hg]
Ao pk vel: 1.1 m/s
Area-P 1/2: 3.97 cm2
MV VTI: 2.09 cm2
S' Lateral: 3 cm

## 2023-09-27 ENCOUNTER — Other Ambulatory Visit: Payer: Self-pay | Admitting: Nurse Practitioner

## 2023-09-27 ENCOUNTER — Other Ambulatory Visit: Payer: Self-pay | Admitting: Internal Medicine

## 2023-09-27 DIAGNOSIS — C9000 Multiple myeloma not having achieved remission: Secondary | ICD-10-CM

## 2023-09-29 ENCOUNTER — Other Ambulatory Visit: Payer: Self-pay | Admitting: *Deleted

## 2023-09-29 ENCOUNTER — Encounter: Payer: Self-pay | Admitting: Internal Medicine

## 2023-09-29 DIAGNOSIS — C9000 Multiple myeloma not having achieved remission: Secondary | ICD-10-CM

## 2023-09-29 MED ORDER — TRAMADOL HCL 50 MG PO TABS
50.0000 mg | ORAL_TABLET | Freq: Two times a day (BID) | ORAL | 0 refills | Status: DC | PRN
Start: 2023-09-29 — End: 2023-11-22

## 2023-09-29 MED ORDER — TRAMADOL HCL 50 MG PO TABS: 50.0000 mg | ORAL_TABLET | Freq: Two times a day (BID) | ORAL | 0 refills | Status: DC | PRN

## 2023-09-29 NOTE — Telephone Encounter (Signed)
 Tramadol refill

## 2023-09-30 ENCOUNTER — Encounter: Payer: Self-pay | Admitting: Internal Medicine

## 2023-10-10 ENCOUNTER — Encounter: Payer: Self-pay | Admitting: Internal Medicine

## 2023-10-10 ENCOUNTER — Inpatient Hospital Stay: Payer: 59

## 2023-10-10 ENCOUNTER — Other Ambulatory Visit: Payer: Self-pay | Admitting: Internal Medicine

## 2023-10-10 ENCOUNTER — Inpatient Hospital Stay: Payer: 59 | Attending: Nurse Practitioner | Admitting: Internal Medicine

## 2023-10-10 VITALS — BP 130/51 | HR 71 | Temp 98.6°F | Resp 16 | Ht 62.0 in | Wt 157.4 lb

## 2023-10-10 DIAGNOSIS — C9002 Multiple myeloma in relapse: Secondary | ICD-10-CM | POA: Diagnosis present

## 2023-10-10 DIAGNOSIS — C9000 Multiple myeloma not having achieved remission: Secondary | ICD-10-CM

## 2023-10-10 DIAGNOSIS — Z5112 Encounter for antineoplastic immunotherapy: Secondary | ICD-10-CM | POA: Insufficient documentation

## 2023-10-10 DIAGNOSIS — E032 Hypothyroidism due to medicaments and other exogenous substances: Secondary | ICD-10-CM | POA: Insufficient documentation

## 2023-10-10 DIAGNOSIS — J449 Chronic obstructive pulmonary disease, unspecified: Secondary | ICD-10-CM

## 2023-10-10 DIAGNOSIS — Z79620 Long term (current) use of immunosuppressive biologic: Secondary | ICD-10-CM | POA: Insufficient documentation

## 2023-10-10 DIAGNOSIS — R112 Nausea with vomiting, unspecified: Secondary | ICD-10-CM | POA: Insufficient documentation

## 2023-10-10 DIAGNOSIS — E876 Hypokalemia: Secondary | ICD-10-CM | POA: Insufficient documentation

## 2023-10-10 LAB — CBC WITH DIFFERENTIAL (CANCER CENTER ONLY)
Abs Immature Granulocytes: 0 10*3/uL (ref 0.00–0.07)
Basophils Absolute: 0 10*3/uL (ref 0.0–0.1)
Basophils Relative: 1 %
Eosinophils Absolute: 0.1 10*3/uL (ref 0.0–0.5)
Eosinophils Relative: 2 %
HCT: 30.5 % — ABNORMAL LOW (ref 36.0–46.0)
Hemoglobin: 9.4 g/dL — ABNORMAL LOW (ref 12.0–15.0)
Immature Granulocytes: 0 %
Lymphocytes Relative: 58 %
Lymphs Abs: 3.5 10*3/uL (ref 0.7–4.0)
MCH: 25.5 pg — ABNORMAL LOW (ref 26.0–34.0)
MCHC: 30.8 g/dL (ref 30.0–36.0)
MCV: 82.7 fL (ref 80.0–100.0)
Monocytes Absolute: 0.3 10*3/uL (ref 0.1–1.0)
Monocytes Relative: 6 %
Neutro Abs: 1.9 10*3/uL (ref 1.7–7.7)
Neutrophils Relative %: 33 %
Platelet Count: 243 10*3/uL (ref 150–400)
RBC: 3.69 MIL/uL — ABNORMAL LOW (ref 3.87–5.11)
RDW: 15.8 % — ABNORMAL HIGH (ref 11.5–15.5)
WBC Count: 5.9 10*3/uL (ref 4.0–10.5)
nRBC: 0 % (ref 0.0–0.2)

## 2023-10-10 LAB — CMP (CANCER CENTER ONLY)
ALT: 8 U/L (ref 0–44)
AST: 13 U/L — ABNORMAL LOW (ref 15–41)
Albumin: 4 g/dL (ref 3.5–5.0)
Alkaline Phosphatase: 69 U/L (ref 38–126)
Anion gap: 7 (ref 5–15)
BUN: 17 mg/dL (ref 8–23)
CO2: 23 mmol/L (ref 22–32)
Calcium: 8.7 mg/dL — ABNORMAL LOW (ref 8.9–10.3)
Chloride: 108 mmol/L (ref 98–111)
Creatinine: 0.77 mg/dL (ref 0.44–1.00)
GFR, Estimated: >60 mL/min (ref >60)
Glucose, Bld: 97 mg/dL (ref 70–99)
Potassium: 3.6 mmol/L (ref 3.5–5.1)
Sodium: 138 mmol/L (ref 135–145)
Total Bilirubin: 0.5 mg/dL (ref 0.0–1.2)
Total Protein: 6.9 g/dL (ref 6.5–8.1)

## 2023-10-10 MED ORDER — HEPARIN SOD (PORK) LOCK FLUSH 100 UNIT/ML IV SOLN
500.0000 [IU] | Freq: Once | INTRAVENOUS | Status: AC | PRN
  Administered 2023-10-10: 500 [IU]
  Filled 2023-10-10: qty 5

## 2023-10-10 MED ORDER — FAMOTIDINE IN NACL 20-0.9 MG/50ML-% IV SOLN
20.0000 mg | Freq: Once | INTRAVENOUS | Status: AC
  Administered 2023-10-10: 20 mg via INTRAVENOUS

## 2023-10-10 MED ORDER — ALBUTEROL SULFATE HFA 108 (90 BASE) MCG/ACT IN AERS: 2.0000 | INHALATION_SPRAY | Freq: Four times a day (QID) | RESPIRATORY_TRACT | 3 refills | Status: AC | PRN

## 2023-10-10 MED ORDER — IRON SUCROSE 20 MG/ML IV SOLN
200.0000 mg | Freq: Once | INTRAVENOUS | Status: AC
  Administered 2023-10-10: 200 mg via INTRAVENOUS
  Filled 2023-10-10: qty 10

## 2023-10-10 MED ORDER — SODIUM CHLORIDE 0.9 % IV SOLN
Freq: Once | INTRAVENOUS | Status: AC
  Filled 2023-10-10: qty 250

## 2023-10-10 MED ORDER — ISATUXIMAB-IRFC CHEMO INJECTION 100 MG/5ML
10.0000 mg/kg | Freq: Once | INTRAVENOUS | Status: AC
  Administered 2023-10-10: 800 mg via INTRAVENOUS
  Filled 2023-10-10: qty 25

## 2023-10-10 MED ORDER — DIPHENHYDRAMINE HCL 50 MG/ML IJ SOLN
50.0000 mg | Freq: Once | INTRAMUSCULAR | Status: AC
  Administered 2023-10-10: 50 mg via INTRAVENOUS

## 2023-10-10 MED ORDER — ACETAMINOPHEN 325 MG PO TABS
650.0000 mg | ORAL_TABLET | Freq: Once | ORAL | Status: AC
Start: 2023-10-10 — End: 2023-10-10
  Administered 2023-10-10: 650 mg via ORAL

## 2023-10-10 MED ORDER — DEXTROSE 5 % IV SOLN
56.0000 mg/m2 | Freq: Once | INTRAVENOUS | Status: AC
  Administered 2023-10-10: 100 mg via INTRAVENOUS
  Filled 2023-10-10: qty 30

## 2023-10-10 NOTE — Progress Notes (Signed)
 [facility] OFFICE PROGRESS NOTE  Patient Care Team: Leanna Sato, MD as PCP - General (Family Medicine) End, Cristal Deer, MD as PCP - Cardiology (Cardiology) Eddie Candle, [name] Earna Coder, MD as Consulting Physician (Hematology and Oncology) Vida Rigger, MD as Consulting Physician (Pulmonary Disease) Tedd Sias Marlana Salvage, MD as Consulting Physician (Endocrinology)   Cancer Staging  Multiple myeloma not having achieved remission [facility]) Staging form: Plasma Cell Myeloma and Plasma Cell Disorders, AJCC 8th Edition - Clinical: No stage assigned - Unsigned - Clinical: No stage assigned - Unsigned   Oncology History Overview Note  # SEP 2018- MULTIPLE MYELOMA IgALamda [2.5 gm/dl; K/L= 84/1324]; STAGE III [beta 2 microglobulin=5.5] [presented with acute renal failure; anemia; NO hypercalcemia; Skeletal survey-Normal]; BMBx- 45% plasma cells; FISH-POSITIVE 11:14 translocation.[STANDARD-high RISK]/cyto-Normal; SEP 2018- PET- L3 posterior element lesion.   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 No morphologic support for residual myeloma disease. Negative for minimal residual disease by MM-MRD flow cytometry; HOLD REVLIMID [leg swelling]; AUG 2021-PET scan negative for myeloma; continue to hold Revlimid  # MARCH 2021-diastolic congestive heart failure [Dr.End]  # BMBx- OCT 2021- BMBx- 5% plasmacytosis-however this appears to be  more polyclonal rather than monoclonal-not explain patient worsening anemia.   # OCT 2022- 18th- Dara- Rev-Dex; MARCH, 2024- PROGRESSION bone marrow biopsy 70% plasma cells; PET scan no bone lesions-however conus medullaris uptake-MRI lumbar spine-   # MAY 2024- DISCONTINUE carfilzomib-dexamethasone- Pomalyst; [rising M-protein; MAY 2024- 1.3. ]   # start 12/27/2022 -Isa-Carfil-Dex;  [decrease dexamethasone 20 weekly- starting 05/04/23- sec to glaucoma].   # OCT 24th, 2024- Bone marrow Biopsy: The findings are consistent with  focal minimal residual lambda restricted plasma cell neoplasm- HOLD treatment sec to glaucoma; and drop in EF AUG 2024- 45-50% [March 2024- 50-55%]  #November 2021-hyperthyroidism/goiter- [D.Bennett/Dr.Solum]-methimazole. S/p Thyroidectomy [Dr.Kim; [facility]-AUG 2022]  --------------------------  # 12/12- RIGHT JUGULAR DVT-x 51m on xarelto; finished April 2020; September 2020-EGD/dysphagia; Dr. Tobi Bastos  # Acute renal failure [Dr.Singh; Proteinuria 1.5gm/day ]; acyclovir/Asprin ------------------------------------------------------------------------------------------------------------   DIAGNOSIS: [ ]  MULTIPLE MYELOMA  STAGE: III/HIGH RISK ;GOALS: CONTROL      Multiple myeloma not having achieved remission (HCC)  11/21/2017 - 04/28/2018 Chemotherapy   Patient is on Treatment Plan : MYELOMA SALVAGE Cyclophosphamide / Carfilzomib / Dexamethasone (CCd) q28d     05/12/2021 - 02/25/2022 Chemotherapy   Patient is on Treatment Plan : MYELOMA RELAPSED REFRACTORY Daratumumab SQ + Lenalidomide + Dexamethasone (DaraRd) q28d     05/12/2021 - 07/23/2022 Chemotherapy   Patient is on Treatment Plan : MYELOMA RELAPSED REFRACTORY Daratumumab SQ + Lenalidomide + Dexamethasone (DaraRd) q28d     11/02/2022 - 11/16/2022 Chemotherapy   Patient is on Treatment Plan : MYELOMA RELAPSED/ REFRACTORY Carfilzomib D1,8,15 (20/27) + Pomalidomide + Dexamethasone (KPd) q28d     12/27/2022 -  Chemotherapy    Patient is on Treatment Plan : MYELOMA Carfilzomib + Dexamethasone + Isatuximab q28d      INTERVAL HISTORY: Ambulating  independently.   Alone.  Brandi [name] 73 y.o.  female pleasant patient above history of relapsed multiple myeloma--most recently on  Isa- Carfil-Dex currently on HOLD sec to Glaucoma [? Steroid induced] is here for a follow up.  Pt in for follow up, reports recently had "flu and pneumonia appx 6-8 weeks ago- hospitalized. Feels better now.    Patient status post evaluation with ophthalmology.  States her vision not any worse; and denies any eye pain.   Patient denies any pain. Appetite is good. No  dyspnea . Mild fatigue.  No fevers or chills. No infections. Denies any worsening pain.  Patient taking tramadol as needed for upper and leg cramps. Mild tingling in feet.    Review of Systems  Constitutional:  Positive for malaise/fatigue. Negative for chills, diaphoresis and fever.  HENT:  Negative for nosebleeds and sore throat.   Eyes:  Negative for double vision.  Respiratory:  Negative for hemoptysis.   Cardiovascular:  Negative for chest pain, palpitations and orthopnea.  Gastrointestinal:  Negative for abdominal pain, blood in stool, constipation, heartburn and melena.  Genitourinary:  Negative for dysuria, frequency and urgency.  Musculoskeletal:  Positive for back pain.  Skin: Negative.  Negative for itching and rash.  Neurological:  Negative for dizziness, tingling, focal weakness, weakness and headaches.  Endo/Heme/Allergies:  Does not bruise/bleed easily.  Psychiatric/Behavioral:  Negative for depression. The patient is not nervous/anxious.     PAST MEDICAL HISTORY :  Past Medical History:  Diagnosis Date   (HFpEF) heart failure with preserved ejection fraction (HCC)    a. 06/2020 Echo: EF 60-65%, no rwma, nl RV size/fxn. Triv MR. Triv TR/AI. Mod elev PASP.   Anxiety    COPD (chronic obstructive pulmonary disease) (HCC)    GERD (gastroesophageal reflux  disease)    Hypertension    Hyperthyroidism    Hypokalemia    IgA myeloma (HCC)    Multiple myeloma (HCC)    Pneumonia    Red blood cell antibody positive, compatible PRBC difficult to obtain    S/P autologous bone marrow transplantation (HCC)     PAST SURGICAL HISTORY :   Past Surgical History:  Procedure Laterality Date   ANTERIOR VITRECTOMY Left 10/07/2021   Procedure: ANTERIOR VITRECTOMY;  Surgeon: Lockie Mola, MD;  Location: [facility];  Service: Ophthalmology;  Laterality: Left;   CATARACT EXTRACTION W/PHACO Left 10/07/2021   Procedure: CATARACT EXTRACTION PHACO AND INTRAOCULAR LENS PLACEMENT (IOC) LEFT VISION BLUE;  Surgeon: Lockie Mola, MD;  Location: [facility];  Service: Ophthalmology;  Laterality: Left;  kit for manual incision 14.56 01:21.4   CATARACT EXTRACTION W/PHACO Right 10/21/2021   Procedure: CATARACT EXTRACTION PHACO AND INTRAOCULAR LENS PLACEMENT (IOC) RIGHT 11.12 01:48.1;  Surgeon: Lockie Mola, MD;  Location: [facility];  Service: Ophthalmology;  Laterality: Right;   ESOPHAGOGASTRODUODENOSCOPY (EGD) WITH PROPOFOL N/A 03/30/2019   Procedure: ESOPHAGOGASTRODUODENOSCOPY (EGD) WITH PROPOFOL;  Surgeon: Wyline Mood, MD;  Location: [facility];  Service: Gastroenterology;  Laterality: N/A;   IR BONE MARROW BIOPSY & ASPIRATION  05/25/2023   IR FLUORO GUIDE PORT INSERTION RIGHT  12/16/2017   IR IMAGING GUIDED PORT INSERTION  06/15/2021   KNEE ARTHROSCOPY Left 09/03/2022   Procedure: ARTHROSCOPY KNEE DEBRIDEMENT;  Surgeon: Lyndle Herrlich, MD;  Location: [facility] ORS;  Service: Orthopedics;  Laterality: Left;   THYROIDECTOMY N/A     FAMILY HISTORY :   Family History  Problem Relation Age of Onset   Pneumonia Mother    Seizures Father     SOCIAL HISTORY:   Social History  Tobacco Use   Smoking status: Former    Current packs/day: 0.00    Types: Cigarettes    Quit date: 03/18/2021    Years since quitting: 2.5    Smokeless tobacco: Never   Tobacco comments:    2 cigarettes QOD  Vaping Use   Vaping status: Never Used  Substance Use Topics   Alcohol use: No   Drug use: No    ALLERGIES:  has no known allergies.  MEDICATIONS:  Current Outpatient Medications  Medication Sig Dispense Refill   acyclovir (ZOVIRAX) 400 MG tablet Take 1 tablet (400 mg total) by mouth 2 (two) times daily. 60 tablet 6   aspirin EC 81 MG EC tablet Take 1 tablet (81 mg total) by mouth daily. 90 tablet 3   brimonidine (ALPHAGAN P) 0.1 % SOLN Place 1 drop into both eyes 2 (two) times daily.     calcium carbonate (CALCIUM 600 HIGH POTENCY) 600 MG TABS tablet Take 2 tablets (1,200 mg total) by mouth 2 (two) times daily. 120 tablet 2   furosemide (LASIX) 40 MG tablet Take 1 tablet (40 mg total) by mouth daily. 90 tablet 3   levothyroxine (SYNTHROID) 175 MCG tablet Take 1 tablet (175 mcg total) by mouth daily. For thyroid 90 tablet 1   lidocaine-prilocaine (EMLA) cream Apply 1 application topically as needed. Apply to port and cover with saran wrap 1-2 hours prior to port access 30 g 1   magnesium oxide (MAG-OX) 400 (240 Mg) MG tablet Take 1 tablet (400 mg total) by mouth 2 (two) times daily. 60 tablet 3   montelukast (SINGULAIR) 10 MG tablet Take 1 tablet (10 mg total) by mouth at bedtime. 30 tablet 3   polyethylene glycol (MIRALAX / GLYCOLAX) packet Take 17 g by mouth daily as needed for mild constipation. 14 each 0   potassium chloride SA (KLOR-CON M) 20 MEQ tablet Take 3 tablets (60 mEq total) by mouth daily. For low potassium 90 tablet 2   spironolactone (ALDACTONE) 25 MG tablet Take 1 tablet by mouth once daily 90 tablet 3   traMADol (ULTRAM) 50 MG tablet Take 1 tablet (50 mg total) by mouth every 12 (twelve) hours as needed. 60 tablet 0   Vitamin D, Ergocalciferol, (DRISDOL) 1.25 MG (50000 UNIT) CAPS capsule Take 1 capsule by mouth once a week 12 capsule 0   albuterol (VENTOLIN HFA) 108 (90 Base) MCG/ACT inhaler Inhale 2  puffs into the lungs every 6 (six) hours as needed for wheezing or shortness of breath. 8 g 3   Brinzolamide-Brimonidine (SIMBRINZA) 1-0.2 % SUSP Apply 1 drop to eye in the morning and at bedtime. I drop both eyes twice a day (Patient not taking: Reported on 10/10/2023)     carvedilol (COREG) 3.125 MG tablet Take 1 tablet (3.125 mg total) by mouth 2 (two) times daily with a meal. 60 tablet 5   Latanoprostene Bunod (VYZULTA) 0.024 % SOLN Apply 1 drop to eye at bedtime. 1 drop Left eye every night (Patient not taking: Reported on 10/10/2023)     ondansetron (ZOFRAN) 8 MG tablet One pill every 8 hours as needed for nausea/vomitting. (Patient not taking: Reported on 10/10/2023) 40 tablet 1   pantoprazole (PROTONIX) 40 MG tablet Take 1 tablet (40 mg total) by mouth daily. 90 tablet 0   No current facility-administered medications for this visit.   Facility-Administered Medications Ordered in Other Visits  Medication Dose Route Frequency Provider Last Rate Last Admin   0.9 %  sodium chloride  infusion   Intravenous Continuous Ralene Muskrat, PA-C       0.9 %  sodium chloride infusion   Intravenous Once Earna Coder, MD       carfilzomib (KYPROLIS) 100 mg in dextrose 5 % 100 mL chemo infusion  56 mg/m2 (Treatment Plan Recorded) Intravenous Once Earna Coder, MD       famotidine (PEPCID) IVPB 20 mg premix  20 mg Intravenous Once Louretta Shorten R, MD 200 mL/hr at 10/10/23 1114 20 mg at 10/10/23 1114   isatuximab-irfc (SARCLISA) 800 mg in sodium chloride 0.9 % 210 mL (3.2 mg/mL) chemo infusion  10 mg/kg (Treatment Plan Recorded) Intravenous Once Earna Coder, MD        PHYSICAL EXAMINATION:   BP (!) 130/51 (BP Location: Right Arm, Patient Position: Sitting, Cuff Size: Large)   Pulse 71   Temp 98.6 F (37 C) (Tympanic)   Resp 16   Ht 5\' 2"  (1.575 m)   Wt 157 lb 6.4 oz (71.4 kg)   SpO2 99%   BMI 28.79 kg/m   Filed Weights   10/10/23 0916  Weight: 157 lb 6.4 oz (71.4  kg)        Mild leg swelling.   Physical Exam HENT:     Head: Normocephalic and atraumatic.  Eyes:     Pupils: Pupils are equal, round, and reactive to light.  Cardiovascular:     Rate and Rhythm: Normal rate and regular rhythm.  Pulmonary:     Effort: Pulmonary effort is normal. No respiratory distress.     Breath sounds: Normal breath sounds. No wheezing.  Abdominal:     General: Bowel sounds are normal. There is no distension.     Palpations: Abdomen is soft. There is no mass.     Tenderness: There is no abdominal tenderness. There is no guarding or rebound.  Musculoskeletal:        General: No tenderness. Normal range of motion.     Cervical back: Normal range of motion and neck supple.  Skin:    General: Skin is warm.  Neurological:     Mental Status: She is alert and oriented to person, place, and time.  Psychiatric:        Mood and Affect: Affect normal.    LABORATORY DATA:  I have reviewed the data as listed    Component Value Date/Time   NA 138 10/10/2023 0906   NA 141 01/25/2020 1530   K 3.6 10/10/2023 0906   CL 108 10/10/2023 0906   CO2 23 10/10/2023 0906   GLUCOSE 97 10/10/2023 0906   BUN 17 10/10/2023 0906   BUN 19 01/25/2020 1530   CREATININE 0.77 10/10/2023 0906   CALCIUM 8.7 (L) 10/10/2023 0906   PROT 6.9 10/10/2023 0906   ALBUMIN 4.0 10/10/2023 0906   AST 13 (L) 10/10/2023 0906   ALT 8 10/10/2023 0906   ALKPHOS 69 10/10/2023 0906   BILITOT 0.5 10/10/2023 0906   GFRNONAA >60 10/10/2023 0906   GFRAA NOT CALCULATED 04/17/2020 1352    No results found for: "SPEP", "UPEP"  Lab Results  Component Value Date   WBC 5.9 10/10/2023   NEUTROABS 1.9 10/10/2023   HGB 9.4 (L) 10/10/2023   HCT 30.5 (L) 10/10/2023   MCV 82.7 10/10/2023   PLT 243 10/10/2023      Chemistry      Component Value Date/Time   NA 138 10/10/2023 0906   NA 141 01/25/2020 1530   K 3.6 10/10/2023 0906  CL 108 10/10/2023 0906   CO2 23 10/10/2023 0906   BUN 17  10/10/2023 0906   BUN 19 01/25/2020 1530   CREATININE 0.77 10/10/2023 0906      Component Value Date/Time   CALCIUM 8.7 (L) 10/10/2023 0906   ALKPHOS 69 10/10/2023 0906   AST 13 (L) 10/10/2023 0906   ALT 8 10/10/2023 0906   BILITOT 0.5 10/10/2023 0906      (H): Data is abnormally high   Latest Reference Range & Units Most Recent 12/24/20 14:01 04/01/21 14:34 04/28/21 09:17 06/11/21 09:42 07/03/21 10:32 08/20/21 10:06  M Protein SerPl Elph-Mcnc Not Observed g/dL 1.1 (H) (C) 6/57/84 69:62 Not Observed (C) 0.6 (H) (C) 0.7 (H) (C) 0.5 (H) (C) 0.7 (H) (C) 1.1 (H) (C)    Latest Reference Range & Units Most Recent 09/17/21 08:47 10/15/21 08:25 11/12/21 08:55  Kappa free light chain 3.3 - 19.4 mg/L 5.0 11/12/21 08:55 7.2 7.2 5.0  Lambda free light chains 5.7 - 26.3 mg/L 223.6 (H) 11/12/21 08:55 215.3 (H) 340.2 (H) 223.6 (H)  Kappa, lambda light chain ratio 0.26 - 1.65  0.02 (L) 11/12/21 08:55 0.03 (L) 0.02 (L) 0.02 (L)  (H): Data is abnormally high (L): Data is abnormally low  RADIOGRAPHIC STUDIES: I have personally reviewed the radiological images as listed and agreed with the findings in the report. No results found.   ASSESSMENT & PLAN:  Multiple myeloma not having achieved remission (HCC) # RECURRENT Multiple myeloma stage III [high-risk cytogenetics-status post KRD-VGPR ; status post autologous stem cell transplant on 06/15/18.   MAY 2024- 1.3. ] start 12/27/2022-Isa-Carfil-Dex [[facility]] OCT 24th, 2024- Bone marrow Biopsy: The findings are consistent with  focal minimal residual lambda restricted plasma cell neoplasm.  Most recently on patient 12/27/2022-Isa-Dex; d-2 carfil weekly q 28 days-currently on HOLD ince May 10, 2023.  -Because of good response to therapy/ and Worsening glaucoma/Drop in EF.   # FEB 2025- M-protein -0.3 ; kappa lambda light chain ratio-1.8 lambda light chain 24-slowly rising.  Recommend restarting therapy with reduced steroids.  FEB 2025- Ejection fraction  improved.   # DROP in EF:  MARCH 2024-Echo [[facility]] Left ventricular EF 50 to 55%; AUG 13th/OCT 23rd, 2024- EF-45-50%.  FEB 26th, 2025- Left ventricular ejection fraction, by estimation, is 50 to 55%. The left ventricle has low normal function Recommend monitoring for now.   # Anemia-Hb 9-10-  I ron sat- 8%; ferritin- 19 Proceed with venofer at next visit.    # Electrolyte: Hypokalemia: mild-  continue Kdur TID; hypocalcemia- 8.0- vit D SEP 2024- 50- continie Ergo 50k weekly. REcommend ca BID    sep 2024- Vit D 54. Stable.   # Iatrogenic hypothyroidism [ Graves/goiter s/p total thyroidectomy;aug,2022 ]-currently on Synthroid 150 mcg/day-[increased in AUG, 2024-    # Glaucoma BIL- L > R [Dr.King; [facility]] - ? Steroid induced on eye drops. decreased the steroids.  Discussed with ophthalmology- HOLD chemo for now. .  # Lower back discomfort x 6 months-see above.  MRI lumbar spine as above.  Continue tramadol- refilled . stable  # Bone lesions /last zometa on 11/27/2017.  S/p   Dental extraction PET scan evidence of any bone lesions.- consider Zometa  at next visit.   # Infection prophylaxis: Acyclovir; asprin- add IVIG infusion [IgG < 400]- plan  IVIG- q 4 w #1 on- 9/17- stable; refilled   # IV access: mediport- functional  # Vaccination: Flu shot today [sep 2024] s/p Pneumonia vaccination [march 2024]- stable.  Recommend  RSV vaccination.   6 w- given dental extrac- on 1/26. :MM panel; K/l light chain ratio q 4 W  D-isa-dex;car1&2-car; weekly x 3; week 4-OFF;  q 28 day cycle; NO zometa- dental issues-ok; but hypocalemia  # DISPOSITION: # Venofer; HOLD Zometa # chemo today; and tomorrow. # follow up as planned- 3/24; 3/31 # add-  appt for 4/7; 4/14-  # add cbc.bmp;weekly; and in 4 weeks- MM panel/k-l light chains- Dr.B   Orders Placed This Encounter  Procedures   CBC with Differential (Cancer Center Only)    Standing Status:   Future    Expected Date:   11/07/2023    Expiration  Date:   11/06/2024   CMP (Cancer Center only)    Standing Status:   Future    Expected Date:   11/07/2023    Expiration Date:   11/06/2024   Multiple Myeloma Panel (SPEP&IFE w/QIG)    Standing Status:   Future    Expected Date:   11/07/2023    Expiration Date:   10/09/2024   Kappa/lambda light chains    Standing Status:   Future    Expected Date:   11/07/2023    Expiration Date:   10/09/2024   All questions were answered. The patient knows to call the clinic with any problems, questions or concerns.      Earna Coder, MD 10/10/2023 11:19 AM End

## 2023-10-10 NOTE — Progress Notes (Signed)
 Refill albuterol inhaler, pended.  Had the flu and pneumonia since last visit.

## 2023-10-10 NOTE — Patient Instructions (Signed)

## 2023-10-10 NOTE — Assessment & Plan Note (Addendum)
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   MAY 2024- 1.3. ] start 12/27/2022-Isa-Carfil-Dex [[facility]] OCT 24th, 2024- Bone marrow Biopsy: The findings are consistent with  focal minimal residual lambda restricted plasma cell neoplasm.  Most recently on patient 12/27/2022-Isa-Dex; d-2 carfil weekly q 28 days-currently on HOLD ince May 10, 2023.  -Because of good response to therapy/ and Worsening glaucoma/Drop in EF.   # FEB 2025- M-protein -0.3 ; kappa lambda light chain ratio-1.8 lambda light chain 24-slowly rising.  Recommend restarting therapy with reduced steroids.  FEB 2025- Ejection fraction improved.   # DROP in EF:  MARCH 2024-Echo [[facility]] Left ventricular EF 50 to 55%; AUG 13th/OCT 23rd, 2024- EF-45-50%.  FEB 26th, 2025- Left ventricular ejection fraction, by estimation, is 50 to 55%. The left ventricle has low normal function Recommend monitoring for now.   # Anemia-Hb 9-10-  I ron sat- 8%; ferritin- 19 Proceed with venofer at next visit.    # Electrolyte: Hypokalemia: mild-  continue Kdur TID; hypocalcemia- 8.0- vit D SEP 2024- 50- continie Ergo 50k weekly. REcommend ca BID    sep 2024- Vit D 54. Stable.   # Iatrogenic hypothyroidism [ Graves/goiter s/p total thyroidectomy;aug,2022 ]-currently on Synthroid 150 mcg/day-[increased in AUG, 2024-    # Glaucoma BIL- L > R [Dr.King; [place] [facility]] - ? Steroid induced on eye drops. decreased the steroids.  Discussed with ophthalmology- HOLD chemo for now. .  # Lower back discomfort x 6 months-see above.  MRI lumbar spine as above.  Continue tramadol- refilled . stable  # Bone lesions /last zometa on 11/27/2017.  S/p   Dental extraction PET scan evidence of any bone lesions.- consider Zometa  at next visit.   # Infection prophylaxis: Acyclovir; asprin- add IVIG infusion [IgG < 400]- plan  IVIG- q 4 w #1 on- 9/17- stable; refilled   # IV access:  mediport- functional  # Vaccination: Flu shot today [sep 2024] s/p Pneumonia vaccination [march 2024]- stable.  Recommend RSV vaccination.   6 w- given dental extrac- on 1/26. :MM panel; K/l light chain ratio q 4 W  D-isa-dex;car1&2-car; weekly x 3; week 4-OFF;  q 28 day cycle; NO zometa- dental issues-ok; but hypocalemia  # DISPOSITION: # Venofer; HOLD Zometa # chemo today; and tomorrow. # follow up as planned- 3/24; 3/31 # add-  appt for 4/7; 4/14-  # add cbc.bmp;weekly; and in 4 weeks- MM panel/k-l light chains- Dr.B

## 2023-10-11 ENCOUNTER — Inpatient Hospital Stay: Payer: 59

## 2023-10-11 ENCOUNTER — Inpatient Hospital Stay (HOSPITAL_BASED_OUTPATIENT_CLINIC_OR_DEPARTMENT_OTHER): Admitting: Hospice and Palliative Medicine

## 2023-10-11 ENCOUNTER — Encounter: Payer: Self-pay | Admitting: Internal Medicine

## 2023-10-11 VITALS — BP 134/83 | HR 90 | Temp 98.2°F | Resp 17

## 2023-10-11 DIAGNOSIS — Z5112 Encounter for antineoplastic immunotherapy: Secondary | ICD-10-CM | POA: Diagnosis not present

## 2023-10-11 DIAGNOSIS — C9000 Multiple myeloma not having achieved remission: Secondary | ICD-10-CM

## 2023-10-11 DIAGNOSIS — E222 Syndrome of inappropriate secretion of antidiuretic hormone: Secondary | ICD-10-CM

## 2023-10-11 DIAGNOSIS — E876 Hypokalemia: Secondary | ICD-10-CM

## 2023-10-11 LAB — BASIC METABOLIC PANEL - CANCER CENTER ONLY
Anion gap: 8 (ref 5–15)
BUN: 16 mg/dL (ref 8–23)
CO2: 21 mmol/L — ABNORMAL LOW (ref 22–32)
Calcium: 8.1 mg/dL — ABNORMAL LOW (ref 8.9–10.3)
Chloride: 109 mmol/L (ref 98–111)
Creatinine: 0.74 mg/dL (ref 0.44–1.00)
GFR, Estimated: >60 mL/min (ref >60)
Glucose, Bld: 92 mg/dL (ref 70–99)
Potassium: 3.2 mmol/L — ABNORMAL LOW (ref 3.5–5.1)
Sodium: 138 mmol/L (ref 135–145)

## 2023-10-11 LAB — KAPPA/LAMBDA LIGHT CHAINS
Kappa free light chain: 4.4 mg/L (ref 3.3–19.4)
Kappa, lambda light chain ratio: 0.06 — ABNORMAL LOW (ref 0.26–1.65)
Lambda free light chains: 69.8 mg/L — ABNORMAL HIGH (ref 5.7–26.3)

## 2023-10-11 LAB — MAGNESIUM: Magnesium: 1.9 mg/dL (ref 1.7–2.4)

## 2023-10-11 LAB — THYROID PANEL WITH TSH
Free Thyroxine Index: 3.6 (ref 1.2–4.9)
T3 Uptake Ratio: 30 % (ref 24–39)
T4, Total: 11.9 ug/dL (ref 4.5–12.0)
TSH: 0.672 u[IU]/mL (ref 0.450–4.500)

## 2023-10-11 MED ORDER — POTASSIUM CHLORIDE 20 MEQ/100ML IV SOLN
20.0000 meq | Freq: Once | INTRAVENOUS | Status: AC
  Administered 2023-10-11: 20 meq via INTRAVENOUS

## 2023-10-11 MED ORDER — SODIUM CHLORIDE 0.9 % IV SOLN
INTRAVENOUS | Status: AC
  Filled 2023-10-11: qty 250

## 2023-10-11 MED ORDER — ONDANSETRON HCL 8 MG PO TABS: ORAL_TABLET | ORAL | 1 refills | Status: DC

## 2023-10-11 MED ORDER — HEPARIN SOD (PORK) LOCK FLUSH 100 UNIT/ML IV SOLN
500.0000 [IU] | Freq: Once | INTRAVENOUS | Status: AC
Start: 2023-10-11 — End: 2023-10-11
  Administered 2023-10-11: 500 [IU] via INTRAVENOUS
  Filled 2023-10-11: qty 5

## 2023-10-11 MED ORDER — SODIUM CHLORIDE 0.9 % IV SOLN
INTRAVENOUS | Status: AC
Start: 2023-10-11 — End: 2023-10-11
  Filled 2023-10-11 (×2): qty 250

## 2023-10-11 NOTE — Progress Notes (Unsigned)
 Patient here today for day 2 of kyprolis. Pt stated she had vomited multiple times throughout the night and had not eaten in about 24 hours. She also said she had a headache 6/10 and she had already taken 2 aleve. Donneta Romberg, MD notified. Per Donneta Romberg, MD to hold treatment today due to vomiting and headache. Josh Borders, NP came to assess patient and ordered 500 ml NS and 20 meq of K to be given. Fluids and K given. Pt stable at discharge and educated if she continued to have symptoms to call the cancer center.

## 2023-10-11 NOTE — Progress Notes (Signed)
 Symptom Management Clinic [facility] at [facility] [address] [phone] [phone]  Patient Care Team: Leanna Sato, MD as PCP - General (Family Medicine) End, Brandi Deer, MD as PCP - Cardiology (Cardiology) Eddie Candle, [name] Earna Coder, MD as Consulting Physician (Hematology and Oncology) Vida Rigger, MD as Consulting Physician (Pulmonary Disease) Solum, Marlana Salvage, MD as Consulting Physician (Endocrinology)   NAME OF PATIENT: Brandi [name]  [id number]  1950/08/13   DATE OF VISIT: 10/11/23  REASON FOR CONSULT: Brandi [name] is a 73 y.o. female with multiple medical problems including relapsed multiple myeloma.  Status post stem cell transplant.  Most recently on treatment with carfilzomib, dex, and isatuximab.    INTERVAL HISTORY: Patient saw Dr. Donneta Romberg yesterday.  With here for treatment today I was an add-on to [facility] for evaluation of nausea/vomiting.  Patient states that she started having nausea and vomiting last night.  Probably vomited 5 times overnight.  Has not had any nausea or vomiting today but does endorse mild headache and dry mouth.  Denies dizziness when standing.  She has been drinking fluids and is keeping those down.  Denies fever or chills or any other symptomatic complaints or concerns today.  Denies any neurologic complaints. Denies recent fevers or illnesses. Denies any easy bleeding or bruising. Reports fair appetite and denies weight loss. Denies chest pain. Denies urinary complaints. Patient offers no further specific complaints today.   PAST MEDICAL HISTORY: Past Medical History:  Diagnosis Date   (HFpEF) heart failure with preserved ejection fraction (HCC)    a. 06/2020 Echo: EF 60-65%, no rwma, nl RV size/fxn. Triv MR. Triv TR/AI. Mod elev PASP.   Anxiety    COPD (chronic obstructive pulmonary disease) (HCC)    GERD (gastroesophageal reflux  disease)    Hypertension    Hyperthyroidism    Hypokalemia    IgA myeloma (HCC)    Multiple myeloma (HCC)    Pneumonia    Red blood cell antibody positive, compatible PRBC difficult to obtain    S/P autologous bone marrow transplantation (HCC)     PAST SURGICAL HISTORY:  Past Surgical History:  Procedure Laterality Date   ANTERIOR VITRECTOMY Left 10/07/2021   Procedure: ANTERIOR VITRECTOMY;  Surgeon: Lockie Mola, MD;  Location: [facility];  Service: Ophthalmology;  Laterality: Left;   CATARACT EXTRACTION W/PHACO Left 10/07/2021   Procedure: CATARACT EXTRACTION PHACO AND INTRAOCULAR LENS PLACEMENT (IOC) LEFT VISION BLUE;  Surgeon: Lockie Mola, MD;  Location: [facility];  Service: Ophthalmology;  Laterality: Left;  kit for manual incision 14.56 01:21.4   CATARACT EXTRACTION W/PHACO Right 10/21/2021   Procedure: CATARACT EXTRACTION PHACO AND INTRAOCULAR LENS PLACEMENT (IOC) RIGHT 11.12 01:48.1;  Surgeon: Lockie Mola, MD;  Location: [facility];  Service: Ophthalmology;  Laterality: Right;   ESOPHAGOGASTRODUODENOSCOPY (EGD) WITH PROPOFOL N/A 03/30/2019   Procedure: ESOPHAGOGASTRODUODENOSCOPY (EGD) WITH PROPOFOL;  Surgeon: Wyline Mood, MD;  Location: [facility];  Service: Gastroenterology;  Laterality: N/A;   IR BONE MARROW BIOPSY & ASPIRATION  05/25/2023   IR FLUORO GUIDE PORT INSERTION RIGHT  12/16/2017   IR IMAGING GUIDED PORT INSERTION  06/15/2021   KNEE ARTHROSCOPY Left 09/03/2022   Procedure: ARTHROSCOPY KNEE DEBRIDEMENT;  Surgeon: Lyndle Herrlich, MD;  Location: [facility] ORS;  Service: Orthopedics;  Laterality: Left;   THYROIDECTOMY N/A     HEMATOLOGY/ONCOLOGY HISTORY:  Oncology History Overview Note  # SEP 2018- MULTIPLE MYELOMA IgALamda [2.5 gm/dl; K/L= 78/4696]; STAGE III Garey Ham  2 microglobulin=5.5] [presented with acute renal failure; anemia; NO hypercalcemia; Skeletal survey-Normal]; BMBx- 45% plasma cells; FISH-POSITIVE 11:14  translocation.[STANDARD-high RISK]/cyto-Normal; SEP 2018- PET- L3 posterior element lesion.   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 No morphologic support for residual myeloma disease. Negative for minimal residual disease by MM-MRD flow cytometry; HOLD REVLIMID [leg swelling]; AUG 2021-PET scan negative for myeloma; continue to hold Revlimid  # MARCH 2021-diastolic congestive heart failure [Dr.End]  # BMBx- OCT 2021- BMBx- 5% plasmacytosis-however this appears to be more polyclonal rather than monoclonal-not explain patient worsening anemia.   # OCT 2022- 18th- Dara- Rev-Dex; MARCH, 2024- PROGRESSION bone marrow biopsy 70% plasma cells; PET scan no bone lesions-however conus medullaris uptake-MRI lumbar spine-   # MAY 2024- DISCONTINUE carfilzomib-dexamethasone- Pomalyst; [rising M-protein; MAY 2024- 1.3. ]   # start 12/27/2022 -Isa-Carfil-Dex;  [decrease dexamethasone 20 weekly- starting 05/04/23- sec to glaucoma].   # OCT 24th, 2024- Bone marrow Biopsy: The findings are consistent with  focal minimal residual lambda restricted plasma cell neoplasm- HOLD treatment sec to glaucoma; and drop in EF AUG 2024- 45-50% [March 2024- 50-55%]  #November 2021-hyperthyroidism/goiter- [D.Bennett/Dr.Solum]-methimazole. S/p Thyroidectomy [Dr.Kim; [facility]-AUG 2022]  --------------------------  # 12/12- RIGHT JUGULAR DVT-x 34m on xarelto; finished April 2020; September 2020-EGD/dysphagia; Dr. Tobi Bastos  # Acute renal  failure [Dr.Singh; Proteinuria 1.5gm/day ]; acyclovir/Asprin ------------------------------------------------------------------------------------------------------------   DIAGNOSIS: [ ]  MULTIPLE MYELOMA  STAGE: III/HIGH RISK ;GOALS: CONTROL      Multiple myeloma not having achieved remission (HCC)  11/21/2017 - 04/28/2018 Chemotherapy   Patient is on Treatment Plan : MYELOMA SALVAGE Cyclophosphamide / Carfilzomib / Dexamethasone (CCd) q28d     05/12/2021 - 02/25/2022 Chemotherapy   Patient is on Treatment Plan : MYELOMA RELAPSED REFRACTORY Daratumumab SQ + Lenalidomide + Dexamethasone (DaraRd) q28d     05/12/2021 - 07/23/2022 Chemotherapy   Patient is on Treatment Plan : MYELOMA RELAPSED REFRACTORY Daratumumab SQ + Lenalidomide + Dexamethasone (DaraRd) q28d     11/02/2022 - 11/16/2022 Chemotherapy   Patient is on Treatment Plan : MYELOMA RELAPSED/ REFRACTORY Carfilzomib D1,8,15 (20/27) + Pomalidomide + Dexamethasone (KPd) q28d     12/27/2022 -  Chemotherapy   Patient is on Treatment Plan : MYELOMA Carfilzomib + Dexamethasone + Isatuximab q28d       ALLERGIES:  has no known allergies.  MEDICATIONS:  Current Outpatient Medications  Medication Sig Dispense Refill   acyclovir (ZOVIRAX) 400 MG tablet Take 1 tablet (400 mg total) by mouth 2 (two) times daily. 60 tablet 6   albuterol (VENTOLIN HFA) 108 (90 Base) MCG/ACT inhaler Inhale 2 puffs into the lungs every 6 (six) hours as needed for wheezing or shortness of breath. 8 g 3   aspirin EC 81 MG EC tablet Take 1 tablet (81 mg total) by mouth daily. 90 tablet 3   brimonidine (ALPHAGAN P) 0.1 % SOLN Place 1 drop into both eyes 2 (two) times daily.     Brinzolamide-Brimonidine (SIMBRINZA) 1-0.2 % SUSP Apply 1 drop to eye in the morning and at bedtime. I drop both eyes twice a  day (Patient not taking: Reported on 10/10/2023)     calcium carbonate (CALCIUM 600 HIGH POTENCY) 600 MG TABS tablet Take 2 tablets (1,200 mg total) by mouth 2 (two) times  daily. 120 tablet 2   carvedilol (COREG) 3.125 MG tablet Take 1 tablet (3.125 mg total) by mouth 2 (two) times daily with a meal. 60 tablet 5   furosemide (LASIX) 40 MG tablet Take 1 tablet (40 mg total) by mouth daily. 90 tablet 3   Latanoprostene Bunod (VYZULTA) 0.024 % SOLN Apply 1 drop to eye at bedtime. 1 drop Left eye every night (Patient not taking: Reported on 10/10/2023)     levothyroxine (SYNTHROID) 175 MCG tablet Take 1 tablet (175 mcg total) by mouth daily. For thyroid 90 tablet 1   lidocaine-prilocaine (EMLA) cream Apply 1 application topically as needed. Apply to port and cover with saran wrap 1-2 hours prior to port access 30 g 1   magnesium oxide (MAG-OX) 400 (240 Mg) MG tablet Take 1 tablet (400 mg total) by mouth 2 (two) times daily. 60 tablet 3   montelukast (SINGULAIR) 10 MG tablet Take 1 tablet (10 mg total) by mouth at bedtime. 30 tablet 3   ondansetron (ZOFRAN) 8 MG tablet One pill every 8 hours as needed for nausea/vomitting. (Patient not taking: Reported on 10/10/2023) 40 tablet 1   pantoprazole (PROTONIX) 40 MG tablet Take 1 tablet (40 mg total) by mouth daily. 90 tablet 0   polyethylene glycol (MIRALAX / GLYCOLAX) packet Take 17 g by mouth daily as needed for mild constipation. 14 each 0   potassium chloride SA (KLOR-CON M) 20 MEQ tablet Take 3 tablets (60 mEq total) by mouth daily. For low potassium 90 tablet 2   spironolactone (ALDACTONE) 25 MG tablet Take 1 tablet by mouth once daily 90 tablet 3   traMADol (ULTRAM) 50 MG tablet Take 1 tablet (50 mg total) by mouth every 12 (twelve) hours as needed. 60 tablet 0   Vitamin D, Ergocalciferol, (DRISDOL) 1.25 MG (50000 UNIT) CAPS capsule Take 1 capsule by mouth once a week 12 capsule 0   No current facility-administered medications for this visit.   Facility-Administered Medications Ordered in Other Visits  Medication Dose Route Frequency Provider Last Rate Last Admin   0.9 %  sodium chloride infusion   Intravenous  Continuous Ralene Muskrat, PA-C        VITAL SIGNS: There were no vitals taken for this visit. There were no vitals filed for this visit.  Estimated body mass index is 28.79 kg/m as calculated from the following:   Height as of 10/10/23: 5\' 2"  (1.575 m).   Weight as of 10/10/23: 157 lb 6.4 oz (71.4 kg).  LABS: CBC:    Component Value Date/Time   WBC 5.9 10/10/2023 0906   WBC 7.3 08/24/2023 1233   HGB 9.4 (L) 10/10/2023 0906   HCT 30.5 (L) 10/10/2023 0906   PLT 243 10/10/2023 0906   MCV 82.7 10/10/2023 0906   NEUTROABS 1.9 10/10/2023 0906   LYMPHSABS 3.5 10/10/2023 0906   MONOABS 0.3 10/10/2023 0906   EOSABS 0.1 10/10/2023 0906   BASOSABS 0.0 10/10/2023 0906   Comprehensive Metabolic Panel:    Component Value Date/Time   NA 138 10/10/2023 0906   NA 141 01/25/2020 1530   K 3.6 10/10/2023 0906   CL 108 10/10/2023 0906   CO2 23 10/10/2023 0906   BUN 17 10/10/2023 0906   BUN 19 01/25/2020 1530   CREATININE 0.77 10/10/2023 0906  GLUCOSE 97 10/10/2023 0906   CALCIUM 8.7 (L) 10/10/2023 0906   AST 13 (L) 10/10/2023 0906   ALT 8 10/10/2023 0906   ALKPHOS 69 10/10/2023 0906   BILITOT 0.5 10/10/2023 0906   PROT 6.9 10/10/2023 0906   ALBUMIN 4.0 10/10/2023 0906    RADIOGRAPHIC STUDIES: ECHOCARDIOGRAM COMPLETE Result Date: 09/19/2023    ECHOCARDIOGRAM REPORT   Patient Name:   EMALENE WELTE Date of Exam: 09/19/2023 Medical Rec #:  [id number]         Height:       62.0 in Accession #:    [id number]        Weight:       159.0 lb Date of Birth:  06/06/1951         BSA:          1.734 m Patient Age:    72 years          BP:           126/50 mmHg Patient Gender: F                 HR:           82 bpm. Exam Location:  [facility] Procedure: 2D Echo, Cardiac Doppler, Color Doppler, Strain Analysis and 3D Echo            (Both Spectral and Color Flow Doppler were utilized during            procedure). Indications:     Chemo Z09  History:         Patient has prior history of Echocardiogram  examinations, most                  recent 05/18/2023. COPD; Risk Factors:Hypertension. Anxiety.  Sonographer:     Cristela Blue Referring Phys:  [id number] Earna Coder Diagnosing Phys: Lorine Bears MD  Sonographer Comments: Global longitudinal strain was attempted. IMPRESSIONS  1. Left ventricular ejection fraction, by estimation, is 50 to 55%. The left ventricle has low normal function. The left ventricle has no regional wall motion abnormalities. Left ventricular diastolic parameters were normal. The average left ventricular  global longitudinal strain is -16.3 %. The global longitudinal strain is normal.  2. Right ventricular systolic function is normal. The right ventricular size is normal. There is normal pulmonary artery systolic pressure.  3. The mitral valve is normal in structure. Mild mitral valve regurgitation. No evidence of mitral stenosis.  4. Tricuspid valve regurgitation is mild to moderate.  5. The aortic valve is normal in structure. Aortic valve regurgitation is not visualized. Aortic valve sclerosis is present, with no evidence of aortic valve stenosis.  6. The inferior vena cava is normal in size with greater than 50% respiratory variability, suggesting right atrial pressure of 3 mmHg. FINDINGS  Left Ventricle: Left ventricular ejection fraction, by estimation, is 50 to 55%. The left ventricle has low normal function. The left ventricle has no regional wall motion abnormalities. The average left ventricular global longitudinal strain is -16.3 %. Strain was performed and the global longitudinal strain is normal. 3D ejection fraction reviewed and evaluated as part of the interpretation. Alternate measurement of EF is felt to be most reflective of LV function. The left ventricular internal cavity size was normal in size. There is no left ventricular hypertrophy. Left ventricular diastolic parameters were normal. Right Ventricle: The right ventricular size is normal. No increase in right  ventricular wall thickness. Right ventricular systolic function is normal. There  is normal pulmonary artery systolic pressure. The tricuspid regurgitant velocity is 2.71 m/s, and  with an assumed right atrial pressure of 5 mmHg, the estimated right ventricular systolic pressure is 34.4 mmHg. Left Atrium: Left atrial size was normal in size. Right Atrium: Right atrial size was normal in size. Pericardium: There is no evidence of pericardial effusion. Mitral Valve: The mitral valve is normal in structure. Mild mitral valve regurgitation. No evidence of mitral valve stenosis. MV peak gradient, 3.2 mmHg. The mean mitral valve gradient is 1.0 mmHg. Tricuspid Valve: The tricuspid valve is normal in structure. Tricuspid valve regurgitation is mild to moderate. No evidence of tricuspid stenosis. Aortic Valve: The aortic valve is normal in structure. Aortic valve regurgitation is not visualized. Aortic valve sclerosis is present, with no evidence of aortic valve stenosis. Aortic valve mean gradient measures 3.0 mmHg. Aortic valve peak gradient measures 4.8 mmHg. Aortic valve area, by VTI measures 2.68 cm. Pulmonic Valve: The pulmonic valve was normal in structure. Pulmonic valve regurgitation is mild. No evidence of pulmonic stenosis. Aorta: The aortic root is normal in size and structure. Venous: The inferior vena cava is normal in size with greater than 50% respiratory variability, suggesting right atrial pressure of 3 mmHg. IAS/Shunts: No atrial level shunt detected by color flow Doppler. Additional Comments: 3D was performed not requiring image post processing on an independent workstation and was indeterminate.  LEFT VENTRICLE PLAX 2D LVIDd:         4.60 cm   Diastology LVIDs:         3.00 cm   LV e' medial:    7.72 cm/s LV PW:         0.90 cm   LV E/e' medial:  10.0 LV IVS:        1.00 cm   LV e' lateral:   9.46 cm/s LVOT diam:     2.00 cm   LV E/e' lateral: 8.1 LV SV:         63 LV SV Index:   36        2D  Longitudinal Strain LVOT Area:     3.14 cm  2D Strain GLS Avg:     -16.3 %  RIGHT VENTRICLE RV S prime:     11.00 cm/s TAPSE (M-mode): 1.8 cm LEFT ATRIUM           Index        RIGHT ATRIUM           Index LA diam:      3.00 cm 1.73 cm/m   RA Area:     13.40 cm LA Vol (A2C): 37.5 ml 21.63 ml/m  RA Volume:   30.20 ml  17.42 ml/m LA Vol (A4C): 42.3 ml 24.39 ml/m  AORTIC VALVE AV Area (Vmax):    2.48 cm AV Area (Vmean):   2.40 cm AV Area (VTI):     2.68 cm AV Vmax:           110.00 cm/s AV Vmean:          76.300 cm/s AV VTI:            0.233 m AV Peak Grad:      4.8 mmHg AV Mean Grad:      3.0 mmHg LVOT Vmax:         87.00 cm/s LVOT Vmean:        58.400 cm/s LVOT VTI:          0.199 m LVOT/AV VTI  ratio: 0.85  AORTA Ao Root diam: 3.10 cm MITRAL VALVE               TRICUSPID VALVE MV Area (PHT): 3.97 cm    TR Peak grad:   29.4 mmHg MV Area VTI:   2.09 cm    TR Vmax:        271.00 cm/s MV Peak grad:  3.2 mmHg MV Mean grad:  1.0 mmHg    SHUNTS MV Vmax:       0.90 m/s    Systemic VTI:  0.20 m MV Vmean:      42.2 cm/s   Systemic Diam: 2.00 cm MV Decel Time: 191 msec MV E velocity: 77.00 cm/s MV A velocity: 70.20 cm/s MV E/A ratio:  1.10 Lorine Bears MD Electronically signed by Lorine Bears MD Signature Date/Time: 09/19/2023/10:53:07 AM    Final     PERFORMANCE STATUS (ECOG) : 1 - Symptomatic but completely ambulatory  Review of Systems Unless otherwise noted, a complete review of systems is negative.  Physical Exam General: NAD Cardiovascular: regular rate and rhythm Pulmonary: clear ant fields Abdomen: soft, nontender, + bowel sounds GU: no suprapubic tenderness Extremities: no edema, no joint deformities Skin: no rashes Neurological: Weakness but otherwise nonfocal  IMPRESSION/PLAN: Multiple myeloma -  carfilzomib, dex, and isatuximab.  Nausea/vomiting -unclear etiology.  However, symptoms seem to have improved today.  Will refill ondansetron should she need it in the future as she did  not have it available.  Proceed with gentle IV fluids.  Hypokalemia -likely due to GI losses.  Patient is on oral supplement.  Will replete with IV K today.   Patient expressed understanding and was in agreement with this plan. She also understands that She can call clinic at any time with any questions, concerns, or complaints.   Thank you for allowing me to participate in the care of this very pleasant patient.   Time Total: 15 minutes  Visit consisted of counseling and education dealing with the complex and emotionally intense issues of symptom management in the setting of serious illness.Greater than 50%  of this time was spent counseling and coordinating care related to the above assessment and plan.  Signed by: Laurette Schimke, PhD, NP-C

## 2023-10-12 ENCOUNTER — Other Ambulatory Visit: Payer: Self-pay

## 2023-10-12 LAB — MULTIPLE MYELOMA PANEL, SERUM
Albumin SerPl Elph-Mcnc: 3.6 g/dL (ref 2.9–4.4)
Albumin/Glob SerPl: 1.3 (ref 0.7–1.7)
Alpha 1: 0.2 g/dL (ref 0.0–0.4)
Alpha2 Glob SerPl Elph-Mcnc: 0.8 g/dL (ref 0.4–1.0)
B-Globulin SerPl Elph-Mcnc: 1.2 g/dL (ref 0.7–1.3)
Gamma Glob SerPl Elph-Mcnc: 0.8 g/dL (ref 0.4–1.8)
Globulin, Total: 2.9 g/dL (ref 2.2–3.9)
IgA: 314 mg/dL (ref 64–422)
IgG (Immunoglobin G), Serum: 513 mg/dL — ABNORMAL LOW (ref 586–1602)
IgM (Immunoglobulin M), Srm: <5 mg/dL — ABNORMAL LOW (ref 26–217)
M Protein SerPl Elph-Mcnc: 0.2 g/dL — ABNORMAL HIGH
Total Protein ELP: 6.5 g/dL (ref 6.0–8.5)

## 2023-10-17 ENCOUNTER — Inpatient Hospital Stay: Payer: 59

## 2023-10-17 ENCOUNTER — Inpatient Hospital Stay (HOSPITAL_BASED_OUTPATIENT_CLINIC_OR_DEPARTMENT_OTHER): Payer: 59 | Admitting: Internal Medicine

## 2023-10-17 ENCOUNTER — Other Ambulatory Visit: Payer: Self-pay | Admitting: Internal Medicine

## 2023-10-17 ENCOUNTER — Encounter: Payer: Self-pay | Admitting: Internal Medicine

## 2023-10-17 VITALS — BP 131/62 | HR 58 | Temp 97.8°F | Resp 18

## 2023-10-17 VITALS — BP 108/64 | HR 59 | Temp 97.2°F | Resp 16 | Wt 157.0 lb

## 2023-10-17 DIAGNOSIS — C9000 Multiple myeloma not having achieved remission: Secondary | ICD-10-CM | POA: Diagnosis not present

## 2023-10-17 DIAGNOSIS — Z5112 Encounter for antineoplastic immunotherapy: Secondary | ICD-10-CM | POA: Diagnosis not present

## 2023-10-17 LAB — CBC WITH DIFFERENTIAL (CANCER CENTER ONLY)
Abs Immature Granulocytes: 0.02 10*3/uL (ref 0.00–0.07)
Basophils Absolute: 0 10*3/uL (ref 0.0–0.1)
Basophils Relative: 1 %
Eosinophils Absolute: 0.2 10*3/uL (ref 0.0–0.5)
Eosinophils Relative: 3 %
HCT: 30.6 % — ABNORMAL LOW (ref 36.0–46.0)
Hemoglobin: 9.6 g/dL — ABNORMAL LOW (ref 12.0–15.0)
Immature Granulocytes: 0 %
Lymphocytes Relative: 50 %
Lymphs Abs: 2.6 10*3/uL (ref 0.7–4.0)
MCH: 25.9 pg — ABNORMAL LOW (ref 26.0–34.0)
MCHC: 31.4 g/dL (ref 30.0–36.0)
MCV: 82.5 fL (ref 80.0–100.0)
Monocytes Absolute: 0.4 10*3/uL (ref 0.1–1.0)
Monocytes Relative: 7 %
Neutro Abs: 2.1 10*3/uL (ref 1.7–7.7)
Neutrophils Relative %: 39 %
Platelet Count: 139 10*3/uL — ABNORMAL LOW (ref 150–400)
RBC: 3.71 MIL/uL — ABNORMAL LOW (ref 3.87–5.11)
RDW: 16.1 % — ABNORMAL HIGH (ref 11.5–15.5)
WBC Count: 5.2 10*3/uL (ref 4.0–10.5)
nRBC: 0 % (ref 0.0–0.2)

## 2023-10-17 LAB — CMP (CANCER CENTER ONLY)
ALT: 8 U/L (ref 0–44)
AST: 15 U/L (ref 15–41)
Albumin: 3.9 g/dL (ref 3.5–5.0)
Alkaline Phosphatase: 62 U/L (ref 38–126)
Anion gap: 10 (ref 5–15)
BUN: 14 mg/dL (ref 8–23)
CO2: 21 mmol/L — ABNORMAL LOW (ref 22–32)
Calcium: 8.1 mg/dL — ABNORMAL LOW (ref 8.9–10.3)
Chloride: 108 mmol/L (ref 98–111)
Creatinine: 0.78 mg/dL (ref 0.44–1.00)
GFR, Estimated: >60 mL/min (ref >60)
Glucose, Bld: 99 mg/dL (ref 70–99)
Potassium: 3.3 mmol/L — ABNORMAL LOW (ref 3.5–5.1)
Sodium: 139 mmol/L (ref 135–145)
Total Bilirubin: 0.5 mg/dL (ref 0.0–1.2)
Total Protein: 6.7 g/dL (ref 6.5–8.1)

## 2023-10-17 MED ORDER — SODIUM CHLORIDE 0.9 % IV SOLN
Freq: Once | INTRAVENOUS | Status: AC
  Filled 2023-10-17: qty 250

## 2023-10-17 MED ORDER — SODIUM CHLORIDE 0.9 % IV SOLN
Freq: Once | INTRAVENOUS | Status: DC
  Filled 2023-10-17: qty 250

## 2023-10-17 MED ORDER — HEPARIN SOD (PORK) LOCK FLUSH 100 UNIT/ML IV SOLN
500.0000 [IU] | Freq: Once | INTRAVENOUS | Status: AC | PRN
Start: 2023-10-17 — End: 2023-10-17
  Administered 2023-10-17: 500 [IU]
  Filled 2023-10-17: qty 5

## 2023-10-17 MED ORDER — DEXTROSE 5 % IV SOLN
56.0000 mg/m2 | Freq: Once | INTRAVENOUS | Status: AC
  Administered 2023-10-17: 100 mg via INTRAVENOUS
  Filled 2023-10-17: qty 30

## 2023-10-17 MED ORDER — DEXAMETHASONE SODIUM PHOSPHATE 10 MG/ML IJ SOLN: 5.0000 mg | Freq: Once | INTRAMUSCULAR | Status: DC

## 2023-10-17 MED ORDER — DEXAMETHASONE SODIUM PHOSPHATE 10 MG/ML IJ SOLN
5.0000 mg | Freq: Once | INTRAMUSCULAR | Status: AC
  Administered 2023-10-17: 5 mg via INTRAVENOUS

## 2023-10-17 MED ORDER — SODIUM CHLORIDE 0.9 % IV SOLN
Freq: Once | INTRAVENOUS | Status: AC
Start: 2023-10-17 — End: 2023-10-17
  Filled 2023-10-17: qty 250

## 2023-10-17 MED ORDER — DEXTROSE 5 % IV SOLN: 56.0000 mg/m2 | Freq: Once | INTRAVENOUS | Status: DC

## 2023-10-17 NOTE — Progress Notes (Signed)
 Patient is doing ok, she has a little bit of fatigue, sluggish. No new questions or concerns.

## 2023-10-17 NOTE — Patient Instructions (Signed)

## 2023-10-17 NOTE — Assessment & Plan Note (Addendum)
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   MAY 2024- 1.3. ] start 12/27/2022-Isa-Carfil-Dex [[facility]] OCT 24th, 2024- Bone marrow Biopsy: The findings are consistent with  focal minimal residual lambda restricted plasma cell neoplasm.  Most recently on patient 12/27/2022-Isa-Dex; d-2 carfil weekly q 28 days-currently on HOLD ince May 10, 2023.  -Because of good response to therapy/ and Worsening glaucoma/Drop in EF.   # FEB 2025- M-protein -0.3 ; kappa lambda light chain ratio-1.8 lambda light chain 24-slowly rising.  Recommend restarting therapy with reduced steroids.  FEB 2025- Ejection fraction improved.  # DROP in EF:  MARCH 2024-Echo [[facility]] Left ventricular EF 50 to 55%; AUG 13th/OCT 23rd, 2024- EF-45-50%.  FEB 26th, 2025- Left ventricular ejection fraction, by estimation, is 50 to 55%. The left ventricle has low normal function Recommend monitoring for now. Stable.   # Anemia-Hb 9-10- JAn 2025- I ron sat- 8%; ferritin- 19 Proceed with venofer-   # Electrolyte: Hypokalemia: mild-  continue Kdur TID; hypocalcemia- 8.0- vit D SEP 2024- 50- continie Ergo 50k weekly. REcommend ca BID    sep 2024- Vit D 54. Stable.   # Iatrogenic hypothyroidism [ Graves/goiter s/p total thyroidectomy;aug,2022 ]-currently on Synthroid 150 mcg/day-[increased in AUG, 2024-   Stable.   # Glaucoma BIL- L > R [Dr.King; [facility]] - ? Steroid induced on eye drops. decreased the steroids.  Discussed with ophthalmology- monitor for now.   # Lower back discomfort x 6 months-see above.  MRI lumbar spine as above.  Continue tramadol- refilled . stable  # Bone lesions /last zometa on 11/27/2017.  S/p   Dental extraction PET scan evidence of any bone lesions.- consider Zometa  at next visit.   # Infection prophylaxis: Acyclovir; asprin- add IVIG infusion [IgG < 400]- plan  IVIG- q 4 w #1 on- 9/17-stable;   # IV access:  mediport- functional  # Vaccination: Flu shot today [sep 2024] s/p Pneumonia vaccination [march 2024]- stable.  Recommend RSV vaccination.   6 w- given dental extrac- on 1/26. :MM panel; K/l light chain ratio q 4 W  D-isa-dex;car1&2-car; weekly x 3; week 4-OFF;  q 28 day cycle; NO zometa- dental issues-ok; but hypocalemia  # DISPOSITION: #  chemo today; and tomorrow. # follow up as planned- 3/31/- add venofer; 4/14- add venofer; # add-  appt for until 4/22-  # add cbc.bmp;weekly; and in 4 weeks- MM panel/k-l light chains- Dr.B

## 2023-10-17 NOTE — Progress Notes (Signed)
 [facility] OFFICE PROGRESS NOTE  Patient Care Team: Leanna Sato, MD as PCP - General (Family Medicine) End, Cristal Deer, MD as PCP - Cardiology (Cardiology) Eddie Candle, [name] Earna Coder, MD as Consulting Physician (Hematology and Oncology) Vida Rigger, MD as Consulting Physician (Pulmonary Disease) Tedd Sias Marlana Salvage, MD as Consulting Physician (Endocrinology)   Cancer Staging  Multiple myeloma not having achieved remission [facility]) Staging form: Plasma Cell Myeloma and Plasma Cell Disorders, AJCC 8th Edition - Clinical: No stage assigned - Unsigned - Clinical: No stage assigned - Unsigned   Oncology History Overview Note  # SEP 2018- MULTIPLE MYELOMA IgALamda [2.5 gm/dl; K/L= 16/1096]; STAGE III [beta 2 microglobulin=5.5] [presented with acute renal failure; anemia; NO hypercalcemia; Skeletal survey-Normal]; BMBx- 45% plasma cells; FISH-POSITIVE 11:14 translocation.[STANDARD-high RISK]/cyto-Normal; SEP 2018- PET- L3 posterior element lesion.   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 No morphologic support for residual myeloma disease. Negative for minimal residual disease by MM-MRD flow cytometry; HOLD REVLIMID [leg swelling]; AUG 2021-PET scan negative for myeloma; continue to hold Revlimid  # MARCH 2021-diastolic congestive heart failure [Dr.End]  # BMBx- OCT 2021- BMBx- 5% plasmacytosis-however this appears to be  more polyclonal rather than monoclonal-not explain patient worsening anemia.   # OCT 2022- 18th- Dara- Rev-Dex; MARCH, 2024- PROGRESSION bone marrow biopsy 70% plasma cells; PET scan no bone lesions-however conus medullaris uptake-MRI lumbar spine-   # MAY 2024- DISCONTINUE carfilzomib-dexamethasone- Pomalyst; [rising M-protein; MAY 2024- 1.3. ]   # start 12/27/2022 -Isa-Carfil-Dex;  [decrease dexamethasone 20 weekly- starting 05/04/23- sec to glaucoma].   # OCT 24th, 2024- Bone marrow Biopsy: The findings are consistent with  focal minimal residual lambda restricted plasma cell neoplasm- HOLD treatment sec to glaucoma; and drop in EF AUG 2024- 45-50% [March 2024- 50-55%]  #November 2021-hyperthyroidism/goiter- [D.Bennett/Dr.Solum]-methimazole. S/p Thyroidectomy [Dr.Kim; [facility]-AUG 2022]  --------------------------  # 12/12- RIGHT JUGULAR DVT-x 70m on xarelto; finished April 2020; September 2020-EGD/dysphagia; Dr. Tobi Bastos  # Acute renal failure [Dr.Singh; Proteinuria 1.5gm/day ]; acyclovir/Asprin ------------------------------------------------------------------------------------------------------------   DIAGNOSIS: [ ]  MULTIPLE MYELOMA  STAGE: III/HIGH RISK ;GOALS: CONTROL      Multiple myeloma not having achieved remission (HCC)  11/21/2017 - 04/28/2018 Chemotherapy   Patient is on Treatment Plan : MYELOMA SALVAGE Cyclophosphamide / Carfilzomib / Dexamethasone (CCd) q28d     05/12/2021 - 02/25/2022 Chemotherapy   Patient is on Treatment Plan : MYELOMA RELAPSED REFRACTORY Daratumumab SQ + Lenalidomide + Dexamethasone (DaraRd) q28d     05/12/2021 - 07/23/2022 Chemotherapy   Patient is on Treatment Plan : MYELOMA RELAPSED REFRACTORY Daratumumab SQ + Lenalidomide + Dexamethasone (DaraRd) q28d     11/02/2022 - 11/16/2022 Chemotherapy   Patient is on Treatment Plan : MYELOMA RELAPSED/ REFRACTORY Carfilzomib D1,8,15 (20/27) + Pomalidomide + Dexamethasone (KPd) q28d     12/27/2022 -  Chemotherapy    Patient is on Treatment Plan : MYELOMA Carfilzomib + Dexamethasone + Isatuximab q28d      INTERVAL HISTORY: Ambulating  independently.   Alone.  Brandi [name] 73 y.o.  female pleasant patient above history of relapsed multiple myeloma--most recently on  Isa- Carfil-Dex currently on HOLD sec to Glaucoma [? Steroid induced] is here for a follow up.  With last infusion- patient has nasuea and vomiting. Also had headaches- currently resolved. Patient is doing ok, she has a little bit of fatigue, sluggish. Otherwise, no new questions or concerns.    Patient status post evaluation with ophthalmology.  States her vision not any worse; and denies any eye pain.  Appetite is good. No  dyspnea .  No fevers or chills. No infections. Denies any worsening pain.  Patient taking tramadol as needed for upper and leg cramps. Mild tingling in feet.    Review of Systems  Constitutional:  Positive for malaise/fatigue. Negative for chills, diaphoresis and fever.  HENT:  Negative for nosebleeds and sore throat.   Eyes:  Negative for double vision.  Respiratory:  Negative for hemoptysis.   Cardiovascular:  Negative for chest pain, palpitations and orthopnea.  Gastrointestinal:  Negative for abdominal pain, blood in stool, constipation, heartburn and melena.  Genitourinary:  Negative for dysuria, frequency and urgency.  Musculoskeletal:  Positive for back pain.  Skin: Negative.  Negative for itching and rash.  Neurological:  Negative for dizziness, tingling, focal weakness, weakness and headaches.  Endo/Heme/Allergies:  Does not bruise/bleed easily.  Psychiatric/Behavioral:  Negative for depression. The patient is not nervous/anxious.     PAST MEDICAL HISTORY :  Past Medical History:  Diagnosis Date   (HFpEF) heart failure with preserved ejection fraction (HCC)    a. 06/2020 Echo: EF 60-65%, no rwma, nl RV size/fxn. Triv MR. Triv TR/AI. Mod elev PASP.   Anxiety    COPD (chronic obstructive pulmonary  disease) (HCC)    GERD (gastroesophageal reflux disease)    Hypertension    Hyperthyroidism    Hypokalemia    IgA myeloma (HCC)    Multiple myeloma (HCC)    Pneumonia    Red blood cell antibody positive, compatible PRBC difficult to obtain    S/P autologous bone marrow transplantation (HCC)     PAST SURGICAL HISTORY :   Past Surgical History:  Procedure Laterality Date   ANTERIOR VITRECTOMY Left 10/07/2021   Procedure: ANTERIOR VITRECTOMY;  Surgeon: Lockie Mola, MD;  Location: [facility];  Service: Ophthalmology;  Laterality: Left;   CATARACT EXTRACTION W/PHACO Left 10/07/2021   Procedure: CATARACT EXTRACTION PHACO AND INTRAOCULAR LENS PLACEMENT (IOC) LEFT VISION BLUE;  Surgeon: Lockie Mola, MD;  Location: [facility];  Service: Ophthalmology;  Laterality: Left;  kit for manual incision 14.56 01:21.4   CATARACT EXTRACTION W/PHACO Right 10/21/2021   Procedure: CATARACT EXTRACTION PHACO AND INTRAOCULAR LENS PLACEMENT (IOC) RIGHT 11.12 01:48.1;  Surgeon: Lockie Mola, MD;  Location: [facility];  Service: Ophthalmology;  Laterality: Right;   ESOPHAGOGASTRODUODENOSCOPY (EGD) WITH PROPOFOL N/A 03/30/2019   Procedure: ESOPHAGOGASTRODUODENOSCOPY (EGD) WITH PROPOFOL;  Surgeon: Wyline Mood, MD;  Location: [facility];  Service: Gastroenterology;  Laterality: N/A;   IR BONE MARROW BIOPSY & ASPIRATION  05/25/2023   IR FLUORO GUIDE PORT INSERTION RIGHT  12/16/2017   IR IMAGING GUIDED PORT INSERTION  06/15/2021   KNEE ARTHROSCOPY Left 09/03/2022   Procedure: ARTHROSCOPY KNEE DEBRIDEMENT;  Surgeon: Lyndle Herrlich, MD;  Location: [facility] ORS;  Service: Orthopedics;  Laterality: Left;   THYROIDECTOMY N/A     FAMILY HISTORY :   Family History  Problem Relation Age of Onset   Pneumonia Mother    Seizures Father     SOCIAL HISTORY:  Social History   Tobacco Use   Smoking status: Former    Current packs/day: 0.00    Types: Cigarettes    Quit  date: 03/18/2021    Years since quitting: 2.5   Smokeless tobacco: Never   Tobacco comments:    2 cigarettes QOD  Vaping Use   Vaping status: Never Used  Substance Use Topics   Alcohol use: No   Drug use: No    ALLERGIES:  has no known allergies.  MEDICATIONS:  Current Outpatient Medications  Medication Sig Dispense Refill   acyclovir (ZOVIRAX) 400 MG tablet Take 1 tablet (400 mg total) by mouth 2 (two) times daily. 60 tablet 6   albuterol (VENTOLIN HFA) 108 (90 Base) MCG/ACT inhaler Inhale 2 puffs into the lungs every 6 (six) hours as needed for wheezing or shortness of breath. 8 g 3   aspirin EC 81 MG EC tablet Take 1 tablet (81 mg total) by mouth daily. 90 tablet 3   brimonidine (ALPHAGAN P) 0.1 % SOLN Place 1 drop into both eyes 2 (two) times daily.     Brinzolamide-Brimonidine (SIMBRINZA) 1-0.2 % SUSP Apply 1 drop to eye in the morning and at bedtime. I drop both eyes twice a day (Patient not taking: Reported on 10/10/2023)     calcium carbonate (CALCIUM 600 HIGH POTENCY) 600 MG TABS tablet Take 2 tablets (1,200 mg total) by mouth 2 (two) times daily. 120 tablet 2   carvedilol (COREG) 3.125 MG tablet Take 1 tablet (3.125 mg total) by mouth 2 (two) times daily with a meal. 60 tablet 5   furosemide (LASIX) 40 MG tablet Take 1 tablet (40 mg total) by mouth daily. 90 tablet 3   Latanoprostene Bunod (VYZULTA) 0.024 % SOLN Apply 1 drop to eye at bedtime. 1 drop Left eye every night (Patient not taking: Reported on 10/10/2023)     levothyroxine (SYNTHROID) 175 MCG tablet Take 1 tablet (175 mcg total) by mouth daily. For thyroid 90 tablet 1   lidocaine-prilocaine (EMLA) cream Apply 1 application topically as needed. Apply to port and cover with saran wrap 1-2 hours prior to port access 30 g 1   magnesium oxide (MAG-OX) 400 (240 Mg) MG tablet Take 1 tablet (400 mg total) by mouth 2 (two) times daily. 60 tablet 3   montelukast (SINGULAIR) 10 MG tablet Take 1 tablet (10 mg total) by mouth at  bedtime. 30 tablet 3   ondansetron (ZOFRAN) 8 MG tablet One pill every 8 hours as needed for nausea/vomitting. 40 tablet 1   pantoprazole (PROTONIX) 40 MG tablet Take 1 tablet (40 mg total) by mouth daily. 90 tablet 0   polyethylene glycol (MIRALAX / GLYCOLAX) packet Take 17 g by mouth daily as needed for mild constipation. 14 each 0   potassium chloride SA (KLOR-CON M) 20 MEQ tablet Take 3 tablets (60 mEq total) by mouth daily. For low potassium 90 tablet 2   spironolactone (ALDACTONE) 25 MG tablet Take 1 tablet by mouth once daily 90 tablet 3   traMADol (ULTRAM) 50 MG tablet Take 1 tablet (50 mg total) by mouth every 12 (twelve) hours as needed. 60 tablet 0   Vitamin D, Ergocalciferol, (DRISDOL) 1.25 MG (50000 UNIT) CAPS capsule Take 1 capsule by mouth once a week 12 capsule 0   No current facility-administered medications for this visit.   Facility-Administered Medications Ordered in Other Visits  Medication Dose Route Frequency Provider Last Rate Last Admin   0.9 %  sodium chloride infusion  Intravenous Continuous Ralene Muskrat, PA-C        PHYSICAL EXAMINATION:   BP 108/64 (BP Location: Right Arm, Patient Position: Sitting, Cuff Size: Normal)   Pulse (!) 59   Temp (!) 97.2 F (36.2 C) (Tympanic)   Resp 16   Wt 157 lb (71.2 kg)   SpO2 100%   BMI 28.72 kg/m   Filed Weights   10/17/23 0900  Weight: 157 lb (71.2 kg)        Mild leg swelling.   Physical Exam HENT:     Head: Normocephalic and atraumatic.  Eyes:     Pupils: Pupils are equal, round, and reactive to light.  Cardiovascular:     Rate and Rhythm: Normal rate and regular rhythm.  Pulmonary:     Effort: Pulmonary effort is normal. No respiratory distress.     Breath sounds: Normal breath sounds. No wheezing.  Abdominal:     General: Bowel sounds are normal. There is no distension.     Palpations: Abdomen is soft. There is no mass.     Tenderness: There is no abdominal tenderness. There is no guarding or  rebound.  Musculoskeletal:        General: No tenderness. Normal range of motion.     Cervical back: Normal range of motion and neck supple.  Skin:    General: Skin is warm.  Neurological:     Mental Status: She is alert and oriented to person, place, and time.  Psychiatric:        Mood and Affect: Affect normal.    LABORATORY DATA:  I have reviewed the data as listed    Component Value Date/Time   NA 139 10/17/2023 0846   NA 141 01/25/2020 1530   K 3.3 (L) 10/17/2023 0846   CL 108 10/17/2023 0846   CO2 21 (L) 10/17/2023 0846   GLUCOSE 99 10/17/2023 0846   BUN 14 10/17/2023 0846   BUN 19 01/25/2020 1530   CREATININE 0.78 10/17/2023 0846   CALCIUM 8.1 (L) 10/17/2023 0846   PROT 6.7 10/17/2023 0846   ALBUMIN 3.9 10/17/2023 0846   AST 15 10/17/2023 0846   ALT 8 10/17/2023 0846   ALKPHOS 62 10/17/2023 0846   BILITOT 0.5 10/17/2023 0846   GFRNONAA >60 10/17/2023 0846   GFRAA NOT CALCULATED 04/17/2020 1352    No results found for: "SPEP", "UPEP"  Lab Results  Component Value Date   WBC 5.2 10/17/2023   NEUTROABS 2.1 10/17/2023   HGB 9.6 (L) 10/17/2023   HCT 30.6 (L) 10/17/2023   MCV 82.5 10/17/2023   PLT 139 (L) 10/17/2023      Chemistry      Component Value Date/Time   NA 139 10/17/2023 0846   NA 141 01/25/2020 1530   K 3.3 (L) 10/17/2023 0846   CL 108 10/17/2023 0846   CO2 21 (L) 10/17/2023 0846   BUN 14 10/17/2023 0846   BUN 19 01/25/2020 1530   CREATININE 0.78 10/17/2023 0846      Component Value Date/Time   CALCIUM 8.1 (L) 10/17/2023 0846   ALKPHOS 62 10/17/2023 0846   AST 15 10/17/2023 0846   ALT 8 10/17/2023 0846   BILITOT 0.5 10/17/2023 0846      (H): Data is abnormally high   Latest Reference Range & Units Most Recent 12/24/20 14:01 04/01/21 14:34 04/28/21 09:17 06/11/21 09:42 07/03/21 10:32 08/20/21 10:06  M Protein SerPl Elph-Mcnc Not Observed g/dL 1.1 (H) (C) 1/61/09 60:45 Not Observed (C) 0.6 (H) (C)  0.7 (H) (C) 0.5 (H) (C) 0.7 (H) (C)  1.1 (H) (C)    Latest Reference Range & Units Most Recent 09/17/21 08:47 10/15/21 08:25 11/12/21 08:55  Kappa free light chain 3.3 - 19.4 mg/L 5.0 11/12/21 08:55 7.2 7.2 5.0  Lambda free light chains 5.7 - 26.3 mg/L 223.6 (H) 11/12/21 08:55 215.3 (H) 340.2 (H) 223.6 (H)  Kappa, lambda light chain ratio 0.26 - 1.65  0.02 (L) 11/12/21 08:55 0.03 (L) 0.02 (L) 0.02 (L)  (H): Data is abnormally high (L): Data is abnormally low  RADIOGRAPHIC STUDIES: I have personally reviewed the radiological images as listed and agreed with the findings in the report. No results found.   ASSESSMENT & PLAN:  Multiple myeloma not having achieved remission (HCC) # RECURRENT Multiple myeloma stage III [high-risk cytogenetics-status post KRD-VGPR ; status post autologous stem cell transplant on 06/15/18.   MAY 2024- 1.3. ] start 12/27/2022-Isa-Carfil-Dex [[facility]] OCT 24th, 2024- Bone marrow Biopsy: The findings are consistent with  focal minimal residual lambda restricted plasma cell neoplasm.  Most recently on patient 12/27/2022-Isa-Dex; d-2 carfil weekly q 28 days-currently on HOLD ince May 10, 2023.  -Because of good response to therapy/ and Worsening glaucoma/Drop in EF.   # FEB 2025- M-protein -0.3 ; kappa lambda light chain ratio-1.8 lambda light chain 24-slowly rising.  Recommend restarting therapy with reduced steroids.  FEB 2025- Ejection fraction improved.  # DROP in EF:  MARCH 2024-Echo [[facility]] Left ventricular EF 50 to 55%; AUG 13th/OCT 23rd, 2024- EF-45-50%.  FEB 26th, 2025- Left ventricular ejection fraction, by estimation, is 50 to 55%. The left ventricle has low normal function Recommend monitoring for now. Stable.   # Anemia-Hb 9-10- JAn 2025- I ron sat- 8%; ferritin- 19 Proceed with venofer-   # Electrolyte: Hypokalemia: mild-  continue Kdur TID; hypocalcemia- 8.0- vit D SEP 2024- 50- continie Ergo 50k weekly. REcommend ca BID    sep 2024- Vit D 54. Stable.   # Iatrogenic hypothyroidism [ Graves/goiter  s/p total thyroidectomy;aug,2022 ]-currently on Synthroid 150 mcg/day-[increased in AUG, 2024-   Stable.   # Glaucoma BIL- L > R [Dr.King; [facility]] - ? Steroid induced on eye drops. decreased the steroids.  Discussed with ophthalmology- monitor for now.   # Lower back discomfort x 6 months-see above.  MRI lumbar spine as above.  Continue tramadol- refilled . stable  # Bone lesions /last zometa on 11/27/2017.  S/p   Dental extraction PET scan evidence of any bone lesions.- consider Zometa  at next visit.   # Infection prophylaxis: Acyclovir; asprin- add IVIG infusion [IgG < 400]- plan  IVIG- q 4 w #1 on- 9/17-stable;   # IV access: mediport- functional  # Vaccination: Flu shot today [sep 2024] s/p Pneumonia vaccination [march 2024]- stable.  Recommend RSV vaccination.   6 w- given dental extrac- on 1/26. :MM panel; K/l light chain ratio q 4 W  D-isa-dex;car1&2-car; weekly x 3; week 4-OFF;  q 28 day cycle; NO zometa- dental issues-ok; but hypocalemia  # DISPOSITION: #  chemo today; and tomorrow. # follow up as planned- 3/31/- add venofer; 4/14- add venofer; # add-  appt for until 4/22-  # add cbc.bmp;weekly; and in 4 weeks- MM panel/k-l light chains- Dr.B   Orders Placed This Encounter  Procedures   CBC with Differential (Cancer Center Only)    Standing Status:   Future    Expected Date:   11/14/2023    Expiration Date:   11/13/2024   CMP (Cancer  Center only)    Standing Status:   Future    Expected Date:   11/14/2023    Expiration Date:   11/13/2024   All questions were answered. The patient knows to call the clinic with any problems, questions or concerns.      Earna Coder, MD 10/17/2023 10:08 AM End

## 2023-10-17 NOTE — Progress Notes (Signed)
 New orders in place-]GB

## 2023-10-18 ENCOUNTER — Inpatient Hospital Stay: Payer: 59

## 2023-10-18 ENCOUNTER — Other Ambulatory Visit: Payer: Self-pay

## 2023-10-18 VITALS — BP 152/80 | HR 65 | Temp 97.6°F | Resp 18

## 2023-10-18 DIAGNOSIS — C9000 Multiple myeloma not having achieved remission: Secondary | ICD-10-CM

## 2023-10-18 DIAGNOSIS — Z5112 Encounter for antineoplastic immunotherapy: Secondary | ICD-10-CM | POA: Diagnosis not present

## 2023-10-18 MED ORDER — DEXTROSE 5 % IV SOLN
56.0000 mg/m2 | Freq: Once | INTRAVENOUS | Status: AC
  Administered 2023-10-18: 100 mg via INTRAVENOUS
  Filled 2023-10-18: qty 30

## 2023-10-18 MED ORDER — HEPARIN SOD (PORK) LOCK FLUSH 100 UNIT/ML IV SOLN
500.0000 [IU] | Freq: Once | INTRAVENOUS | Status: AC | PRN
Start: 2023-10-18 — End: 2023-10-18
  Administered 2023-10-18: 500 [IU]
  Filled 2023-10-18: qty 5

## 2023-10-18 MED ORDER — SODIUM CHLORIDE 0.9 % IV SOLN
Freq: Once | INTRAVENOUS | Status: AC
  Filled 2023-10-18: qty 250

## 2023-10-18 MED ORDER — DEXAMETHASONE SODIUM PHOSPHATE 10 MG/ML IJ SOLN
5.0000 mg | Freq: Once | INTRAMUSCULAR | Status: AC
  Administered 2023-10-18: 5 mg via INTRAVENOUS
  Filled 2023-10-18: qty 1

## 2023-10-24 ENCOUNTER — Inpatient Hospital Stay (HOSPITAL_BASED_OUTPATIENT_CLINIC_OR_DEPARTMENT_OTHER): Payer: 59 | Admitting: Internal Medicine

## 2023-10-24 ENCOUNTER — Encounter: Payer: Self-pay | Admitting: Internal Medicine

## 2023-10-24 ENCOUNTER — Inpatient Hospital Stay

## 2023-10-24 ENCOUNTER — Inpatient Hospital Stay: Payer: 59

## 2023-10-24 VITALS — BP 135/78 | HR 50 | Temp 98.0°F | Resp 20 | Ht 62.0 in | Wt 157.6 lb

## 2023-10-24 VITALS — BP 163/92 | HR 55 | Temp 96.5°F | Resp 19

## 2023-10-24 DIAGNOSIS — Z5112 Encounter for antineoplastic immunotherapy: Secondary | ICD-10-CM | POA: Diagnosis not present

## 2023-10-24 DIAGNOSIS — C9000 Multiple myeloma not having achieved remission: Secondary | ICD-10-CM

## 2023-10-24 DIAGNOSIS — E876 Hypokalemia: Secondary | ICD-10-CM

## 2023-10-24 LAB — CMP (CANCER CENTER ONLY)
ALT: 7 U/L (ref 0–44)
AST: 13 U/L — ABNORMAL LOW (ref 15–41)
Albumin: 3.6 g/dL (ref 3.5–5.0)
Alkaline Phosphatase: 60 U/L (ref 38–126)
Anion gap: 7 (ref 5–15)
BUN: 13 mg/dL (ref 8–23)
CO2: 23 mmol/L (ref 22–32)
Calcium: 8.2 mg/dL — ABNORMAL LOW (ref 8.9–10.3)
Chloride: 112 mmol/L — ABNORMAL HIGH (ref 98–111)
Creatinine: 0.64 mg/dL (ref 0.44–1.00)
GFR, Estimated: >60 mL/min (ref >60)
Glucose, Bld: 89 mg/dL (ref 70–99)
Potassium: 3.1 mmol/L — ABNORMAL LOW (ref 3.5–5.1)
Sodium: 142 mmol/L (ref 135–145)
Total Bilirubin: 0.8 mg/dL (ref 0.0–1.2)
Total Protein: 6.3 g/dL — ABNORMAL LOW (ref 6.5–8.1)

## 2023-10-24 LAB — CBC WITH DIFFERENTIAL (CANCER CENTER ONLY)
Abs Immature Granulocytes: 0.01 10*3/uL (ref 0.00–0.07)
Basophils Absolute: 0 10*3/uL (ref 0.0–0.1)
Basophils Relative: 0 %
Eosinophils Absolute: 0.1 10*3/uL (ref 0.0–0.5)
Eosinophils Relative: 3 %
HCT: 30.2 % — ABNORMAL LOW (ref 36.0–46.0)
Hemoglobin: 9.5 g/dL — ABNORMAL LOW (ref 12.0–15.0)
Immature Granulocytes: 0 %
Lymphocytes Relative: 43 %
Lymphs Abs: 1.5 10*3/uL (ref 0.7–4.0)
MCH: 26.2 pg (ref 26.0–34.0)
MCHC: 31.5 g/dL (ref 30.0–36.0)
MCV: 83.2 fL (ref 80.0–100.0)
Monocytes Absolute: 0.4 10*3/uL (ref 0.1–1.0)
Monocytes Relative: 12 %
Neutro Abs: 1.4 10*3/uL — ABNORMAL LOW (ref 1.7–7.7)
Neutrophils Relative %: 42 %
Platelet Count: 144 10*3/uL — ABNORMAL LOW (ref 150–400)
RBC: 3.63 MIL/uL — ABNORMAL LOW (ref 3.87–5.11)
RDW: 16.7 % — ABNORMAL HIGH (ref 11.5–15.5)
WBC Count: 3.5 10*3/uL — ABNORMAL LOW (ref 4.0–10.5)
nRBC: 0 % (ref 0.0–0.2)

## 2023-10-24 MED ORDER — DIPHENHYDRAMINE HCL 50 MG/ML IJ SOLN
50.0000 mg | Freq: Once | INTRAMUSCULAR | Status: AC
  Administered 2023-10-24: 50 mg via INTRAVENOUS
  Filled 2023-10-24: qty 1

## 2023-10-24 MED ORDER — DEXTROSE 5 % IV SOLN
56.0000 mg/m2 | Freq: Once | INTRAVENOUS | Status: AC
  Administered 2023-10-24: 100 mg via INTRAVENOUS
  Filled 2023-10-24: qty 30

## 2023-10-24 MED ORDER — FAMOTIDINE IN NACL 20-0.9 MG/50ML-% IV SOLN
20.0000 mg | Freq: Once | INTRAVENOUS | Status: AC
  Administered 2023-10-24: 20 mg via INTRAVENOUS
  Filled 2023-10-24: qty 50

## 2023-10-24 MED ORDER — SODIUM CHLORIDE 0.9 % IV SOLN
Freq: Once | INTRAVENOUS | Status: AC
  Filled 2023-10-24: qty 250

## 2023-10-24 MED ORDER — HEPARIN SOD (PORK) LOCK FLUSH 100 UNIT/ML IV SOLN
500.0000 [IU] | Freq: Once | INTRAVENOUS | Status: AC | PRN
  Administered 2023-10-24: 500 [IU]
  Filled 2023-10-24: qty 5

## 2023-10-24 MED ORDER — SODIUM CHLORIDE 0.9% FLUSH
10.0000 mL | Freq: Once | INTRAVENOUS | Status: AC | PRN
  Administered 2023-10-24: 10 mL
  Filled 2023-10-24: qty 10

## 2023-10-24 MED ORDER — SODIUM CHLORIDE 0.9 % IV SOLN
10.0000 mg/kg | Freq: Once | INTRAVENOUS | Status: AC
  Administered 2023-10-24: 800 mg via INTRAVENOUS
  Filled 2023-10-24: qty 25

## 2023-10-24 MED ORDER — ACETAMINOPHEN 325 MG PO TABS
650.0000 mg | ORAL_TABLET | Freq: Once | ORAL | Status: AC
  Administered 2023-10-24: 650 mg via ORAL
  Filled 2023-10-24: qty 2

## 2023-10-24 MED ORDER — IRON SUCROSE 20 MG/ML IV SOLN
200.0000 mg | Freq: Once | INTRAVENOUS | Status: AC
  Administered 2023-10-24: 200 mg via INTRAVENOUS

## 2023-10-24 MED ORDER — DEXAMETHASONE SODIUM PHOSPHATE 10 MG/ML IJ SOLN
5.0000 mg | Freq: Once | INTRAMUSCULAR | Status: AC
  Administered 2023-10-24: 5 mg via INTRAVENOUS
  Filled 2023-10-24: qty 1

## 2023-10-24 NOTE — Assessment & Plan Note (Addendum)
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   MAY 2024- 1.3. ] start 12/27/2022-Isa-Carfil-Dex [[facility]] OCT 24th, 2024- Bone marrow Biopsy: The findings are consistent with  focal minimal residual lambda restricted plasma cell neoplasm.  Most recently on patient 12/27/2022-Isa-Dex; d-2 carfil weekly q 28 days-currently on HOLD ince May 10, 2023.  -Because of good response to therapy/ and Worsening glaucoma/Drop in EF.   # MARCH 2025- M-protein -0.3 ; kappa lambda light chain ratio-1.8 lambda light chain 63-slowly rising.  FEB 2025- Ejection fraction improved.  # Proceed with Isa-Dex-Carfil- Labs-CBC/chemistries were reviewed with the patient.  # DROP in EF:  MARCH 2024-Echo [[facility]] Left ventricular EF 50 to 55%; AUG 13th/OCT 23rd, 2024- EF-45-50%.  FEB 26th, 2025- Left ventricular ejection fraction, by estimation, is 50 to 55%. The left ventricle has low normal function Recommend monitoring for now. Stable.   # Anemia-Hb 9-10- JAn 2025- I ron sat- 8%; ferritin- 19 Proceed with venofer- Stable.   # Electrolyte: Hypokalemia: mild-  continue Kdur TID; hypocalcemia- 8.0- vit D SEP 2024- 50- continie Ergo 50k weekly. REcommend ca BID    sep 2024- Vit D 54. Stable.   # Iatrogenic hypothyroidism [ Graves/goiter s/p total thyroidectomy;aug,2022 ]-currently on Synthroid 150 mcg/day-[increased in AUG, 2024-   Stable.   # Glaucoma BIL- L > R [Dr.King; [facility]] - ? Steroid induced on eye drops. decreased the steroids.  Discussed with ophthalmology- monitor for now.   # Lower back discomfort x 6 months-see above.  MRI lumbar spine as above.  Continue tramadol- refilled . stable  # Bone lesions /last zometa on 11/27/2017.  S/p   Dental extraction PET scan evidence of any bone lesions.- consider Zometa  at next visit.   # Infection prophylaxis: Acyclovir; asprin- add IVIG infusion [IgG < 400]- plan  IVIG- q 4 w  #1 on- 9/17-stable;   # IV access: mediport- functional  # Vaccination: Flu shot today [sep 2024] s/p Pneumonia vaccination [march 2024]- stable.  Recommend RSV vaccination.   6 w- given dental extrac- on 1/26. :MM panel; K/l light chain ratio q 4 W  D-isa-dex;car1&2-car; weekly x 3; week 4-OFF;  q 28 day cycle; NO zometa- dental issues-ok; but hypocalemia  # DISPOSITION: #  chemo today;  and tomorrow. Today- venofer- # follow up as planned- 3/31/- add venofer; 4/14- add venofer; # add-  appt for until 4/30  # add cbc.bmp;weekly; and in 4 weeks- MM panel/k-l light chains- Dr.B

## 2023-10-24 NOTE — Progress Notes (Signed)
 Per Dr. Donneta Romberg,   OK to continue with Sarclisa dose of 800 mg despite weight loss.   Sharen Hones, PharmD, BCPS Clinical Pharmacist

## 2023-10-24 NOTE — Progress Notes (Signed)
 [facility] OFFICE PROGRESS NOTE  Patient Care Team: Leanna Sato, MD as PCP - General (Family Medicine) End, Cristal Deer, MD as PCP - Cardiology (Cardiology) Eddie Candle, [name] Earna Coder, MD as Consulting Physician (Hematology and Oncology) Vida Rigger, MD as Consulting Physician (Pulmonary Disease) Tedd Sias Marlana Salvage, MD as Consulting Physician (Endocrinology)   Cancer Staging  Multiple myeloma not having achieved remission [facility]) Staging form: Plasma Cell Myeloma and Plasma Cell Disorders, AJCC 8th Edition - Clinical: No stage assigned - Unsigned - Clinical: No stage assigned - Unsigned   Oncology History Overview Note  # SEP 2018- MULTIPLE MYELOMA IgALamda [2.5 gm/dl; K/L= 62/1308]; STAGE III [beta 2 microglobulin=5.5] [presented with acute renal failure; anemia; NO hypercalcemia; Skeletal survey-Normal]; BMBx- 45% plasma cells; FISH-POSITIVE 11:14 translocation.[STANDARD-high RISK]/cyto-Normal; SEP 2018- PET- L3 posterior element lesion.   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 No morphologic support for residual myeloma disease. Negative for minimal residual disease by MM-MRD flow cytometry; HOLD REVLIMID [leg swelling]; AUG 2021-PET scan negative for myeloma; continue to hold Revlimid  # MARCH 2021-diastolic congestive heart failure [Dr.End]  # BMBx- OCT 2021- BMBx- 5% plasmacytosis-however this appears to be  more polyclonal rather than monoclonal-not explain patient worsening anemia.   # OCT 2022- 18th- Dara- Rev-Dex; MARCH, 2024- PROGRESSION bone marrow biopsy 70% plasma cells; PET scan no bone lesions-however conus medullaris uptake-MRI lumbar spine-   # MAY 2024- DISCONTINUE carfilzomib-dexamethasone- Pomalyst; [rising M-protein; MAY 2024- 1.3. ]   # start 12/27/2022 -Isa-Carfil-Dex;  [decrease dexamethasone 20 weekly- starting 05/04/23- sec to glaucoma].   # OCT 24th, 2024- Bone marrow Biopsy: The findings are consistent with  focal minimal residual lambda restricted plasma cell neoplasm- HOLD treatment sec to glaucoma; and drop in EF AUG 2024- 45-50% [March 2024- 50-55%]  #November 2021-hyperthyroidism/goiter- [D.Bennett/Dr.Solum]-methimazole. S/p Thyroidectomy [Dr.Kim; [facility]-AUG 2022]  --------------------------  # 12/12- RIGHT JUGULAR DVT-x 11m on xarelto; finished April 2020; September 2020-EGD/dysphagia; Dr. Tobi Bastos  # Acute renal failure [Dr.Singh; Proteinuria 1.5gm/day ]; acyclovir/Asprin ------------------------------------------------------------------------------------------------------------   DIAGNOSIS: [ ]  MULTIPLE MYELOMA  STAGE: III/HIGH RISK ;GOALS: CONTROL      Multiple myeloma not having achieved remission (HCC)  11/21/2017 - 04/28/2018 Chemotherapy   Patient is on Treatment Plan : MYELOMA SALVAGE Cyclophosphamide / Carfilzomib / Dexamethasone (CCd) q28d     05/12/2021 - 02/25/2022 Chemotherapy   Patient is on Treatment Plan : MYELOMA RELAPSED REFRACTORY Daratumumab SQ + Lenalidomide + Dexamethasone (DaraRd) q28d     05/12/2021 - 07/23/2022 Chemotherapy   Patient is on Treatment Plan : MYELOMA RELAPSED REFRACTORY Daratumumab SQ + Lenalidomide + Dexamethasone (DaraRd) q28d     11/02/2022 - 11/16/2022 Chemotherapy   Patient is on Treatment Plan : MYELOMA RELAPSED/ REFRACTORY Carfilzomib D1,8,15 (20/27) + Pomalidomide + Dexamethasone (KPd) q28d     12/27/2022 -  Chemotherapy    Patient is on Treatment Plan : MYELOMA Carfilzomib + Dexamethasone + Isatuximab q28d      INTERVAL HISTORY: Ambulating  independently.   Alone.  Brandi [name] 73 y.o.  female pleasant patient above history of relapsed multiple myeloma--most recently on  Isa- Carfil-Dex  is here for a follow up.   No fevers or chills. No infections. Denies any worsening pain.  Patient taking tramadol as needed for upper and leg cramps. Mild tingling in feet.    Review of Systems  Constitutional:  Positive for malaise/fatigue. Negative for chills, diaphoresis and fever.  HENT:  Negative for nosebleeds and sore throat.   Eyes:  Negative for double vision.  Respiratory:  Negative for hemoptysis.   Cardiovascular:  Negative for chest pain, palpitations and orthopnea.  Gastrointestinal:  Negative for abdominal pain, blood in stool, constipation, heartburn and melena.  Genitourinary:  Negative for dysuria, frequency and urgency.  Musculoskeletal:  Positive for back pain.  Skin: Negative.  Negative for itching and rash.  Neurological:  Negative for dizziness, tingling, focal weakness, weakness and headaches.  Endo/Heme/Allergies:  Does not bruise/bleed easily.  Psychiatric/Behavioral:  Negative for depression. The patient is not nervous/anxious.     PAST MEDICAL HISTORY :  Past Medical History:  Diagnosis Date   (HFpEF) heart failure with preserved ejection fraction (HCC)    a. 06/2020 Echo: EF 60-65%, no rwma, nl RV size/fxn. Triv MR. Triv TR/AI. Mod elev PASP.   Anxiety    COPD (chronic obstructive pulmonary disease) (HCC)    GERD (gastroesophageal reflux disease)    Hypertension    Hyperthyroidism    Hypokalemia    IgA myeloma (HCC)    Multiple myeloma (HCC)    Pneumonia    Red blood cell antibody positive, compatible PRBC difficult to obtain    S/P autologous bone marrow transplantation (HCC)     PAST SURGICAL HISTORY :   Past Surgical History:  Procedure Laterality Date   ANTERIOR  VITRECTOMY Left 10/07/2021   Procedure: ANTERIOR VITRECTOMY;  Surgeon: Lockie Mola, MD;  Location: [facility];  Service: Ophthalmology;  Laterality: Left;   CATARACT EXTRACTION W/PHACO Left 10/07/2021   Procedure: CATARACT EXTRACTION PHACO AND INTRAOCULAR LENS PLACEMENT (IOC) LEFT VISION BLUE;  Surgeon: Lockie Mola, MD;  Location: [facility];  Service: Ophthalmology;  Laterality: Left;  kit for manual incision 14.56 01:21.4   CATARACT EXTRACTION W/PHACO Right 10/21/2021   Procedure: CATARACT EXTRACTION PHACO AND INTRAOCULAR LENS PLACEMENT (IOC) RIGHT 11.12 01:48.1;  Surgeon: Lockie Mola, MD;  Location: [facility];  Service: Ophthalmology;  Laterality: Right;   ESOPHAGOGASTRODUODENOSCOPY (EGD) WITH PROPOFOL N/A 03/30/2019   Procedure: ESOPHAGOGASTRODUODENOSCOPY (EGD) WITH PROPOFOL;  Surgeon: Wyline Mood, MD;  Location: [facility] ENDOSCOPY;  Service: Gastroenterology;  Laterality: N/A;   IR BONE MARROW BIOPSY & ASPIRATION  05/25/2023   IR FLUORO GUIDE PORT INSERTION RIGHT  12/16/2017   IR IMAGING GUIDED PORT INSERTION  06/15/2021   KNEE ARTHROSCOPY Left 09/03/2022   Procedure: ARTHROSCOPY KNEE DEBRIDEMENT;  Surgeon: Lyndle Herrlich, MD;  Location: [facility] ORS;  Service: Orthopedics;  Laterality: Left;   THYROIDECTOMY N/A     FAMILY HISTORY :   Family History  Problem Relation Age of Onset   Pneumonia Mother    Seizures Father     SOCIAL HISTORY:   Social History   Tobacco Use   Smoking status: Former    Current packs/day: 0.00    Types: Cigarettes    Quit date: 03/18/2021    Years since quitting: 2.6   Smokeless tobacco: Never   Tobacco comments:    2 cigarettes QOD  Vaping Use   Vaping status: Never Used  Substance Use Topics   Alcohol  use: No   Drug use: No    ALLERGIES:  has no known allergies.  MEDICATIONS:  Current Outpatient Medications  Medication Sig Dispense Refill   acyclovir (ZOVIRAX) 400 MG tablet Take 1 tablet (400 mg  total) by mouth 2 (two) times daily. 60 tablet 6   albuterol (VENTOLIN HFA) 108 (90 Base) MCG/ACT inhaler Inhale 2 puffs into the lungs every 6 (six) hours as needed for wheezing or shortness of breath. 8 g 3   aspirin EC 81 MG EC tablet Take 1 tablet (81 mg total) by mouth daily. 90 tablet 3   brimonidine (ALPHAGAN P) 0.1 % SOLN Place 1 drop into both eyes 2 (two) times daily.     calcium carbonate (CALCIUM 600 HIGH POTENCY) 600 MG TABS tablet Take 2 tablets (1,200 mg total) by mouth 2 (two) times daily. 120 tablet 2   carvedilol (COREG) 3.125 MG tablet Take 1 tablet (3.125 mg total) by mouth 2 (two) times daily with a meal. 60 tablet 5   furosemide (LASIX) 40 MG tablet Take 1 tablet (40 mg total) by mouth daily. 90 tablet 3   levothyroxine (SYNTHROID) 175 MCG tablet Take 1 tablet (175 mcg total) by mouth daily. For thyroid 90 tablet 1   lidocaine-prilocaine (EMLA) cream Apply 1 application topically as needed. Apply to port and cover with saran wrap 1-2 hours prior to port access 30 g 1   magnesium oxide (MAG-OX) 400 (240 Mg) MG tablet Take 1 tablet (400 mg total) by mouth 2 (two) times daily. 60 tablet 3   montelukast (SINGULAIR) 10 MG tablet Take 1 tablet (10 mg total) by mouth at bedtime. 30 tablet 3   ondansetron (ZOFRAN) 8 MG tablet One pill every 8 hours as needed for nausea/vomitting. 40 tablet 1   polyethylene glycol (MIRALAX / GLYCOLAX) packet Take 17 g by mouth daily as needed for mild constipation. 14 each 0   potassium chloride SA (KLOR-CON M) 20 MEQ tablet Take 3 tablets (60 mEq total) by mouth daily. For low potassium 90 tablet 2   spironolactone (ALDACTONE) 25 MG tablet Take 1 tablet by mouth once daily 90 tablet 3   traMADol (ULTRAM) 50 MG tablet Take 1 tablet (50 mg total) by mouth every 12 (twelve) hours as needed. 60 tablet 0   Vitamin D, Ergocalciferol, (DRISDOL) 1.25 MG (50000 UNIT) CAPS capsule Take 1 capsule by mouth once a week 12 capsule 0   Brinzolamide-Brimonidine  (SIMBRINZA) 1-0.2 % SUSP Apply 1 drop to eye in the morning and at bedtime. I drop both eyes twice a day (Patient not taking: Reported on 10/24/2023)     Latanoprostene Bunod (VYZULTA) 0.024 % SOLN Apply 1 drop to eye at bedtime. 1 drop Left eye every night (Patient not taking: Reported on 10/24/2023)     pantoprazole (PROTONIX) 40 MG tablet Take 1 tablet (40 mg total) by mouth daily. 90 tablet 0   No current facility-administered medications for this visit.   Facility-Administered Medications Ordered in Other Visits  Medication Dose Route Frequency Provider Last Rate Last Admin   0.9 %  sodium chloride infusion   Intravenous Continuous Ralene Muskrat, PA-C        PHYSICAL EXAMINATION:   BP 135/78 (BP Location: Right Arm, Patient Position: Sitting, Cuff Size: Normal)   Pulse (!) 50   Temp 98 F (36.7 C) (Tympanic)   Resp 20   Ht 5\' 2"  (1.575 m)   Wt 157 lb 9.6 oz (71.5 kg)   SpO2 100%  BMI 28.83 kg/m   Filed Weights   10/24/23 0907  Weight: 157 lb 9.6 oz (71.5 kg)        Mild leg swelling.   Physical Exam HENT:     Head: Normocephalic and atraumatic.  Eyes:     Pupils: Pupils are equal, round, and reactive to light.  Cardiovascular:     Rate and Rhythm: Normal rate and regular rhythm.  Pulmonary:     Effort: Pulmonary effort is normal. No respiratory distress.     Breath sounds: Normal breath sounds. No wheezing.  Abdominal:     General: Bowel sounds are normal. There is no distension.     Palpations: Abdomen is soft. There is no mass.     Tenderness: There is no abdominal tenderness. There is no guarding or rebound.  Musculoskeletal:        General: No tenderness. Normal range of motion.     Cervical back: Normal range of motion and neck supple.  Skin:    General: Skin is warm.  Neurological:     Mental Status: She is alert and oriented to person, place, and time.  Psychiatric:        Mood and Affect: Affect normal.    LABORATORY DATA:  I have reviewed  the data as listed    Component Value Date/Time   NA 142 10/24/2023 0917   NA 141 01/25/2020 1530   K 3.1 (L) 10/24/2023 0917   CL 112 (H) 10/24/2023 0917   CO2 23 10/24/2023 0917   GLUCOSE 89 10/24/2023 0917   BUN 13 10/24/2023 0917   BUN 19 01/25/2020 1530   CREATININE 0.64 10/24/2023 0917   CALCIUM 8.2 (L) 10/24/2023 0917   PROT 6.3 (L) 10/24/2023 0917   ALBUMIN 3.6 10/24/2023 0917   AST 13 (L) 10/24/2023 0917   ALT 7 10/24/2023 0917   ALKPHOS 60 10/24/2023 0917   BILITOT 0.8 10/24/2023 0917   GFRNONAA >60 10/24/2023 0917   GFRAA NOT CALCULATED 04/17/2020 1352    No results found for: "SPEP", "UPEP"  Lab Results  Component Value Date   WBC 3.5 (L) 10/24/2023   NEUTROABS 1.4 (L) 10/24/2023   HGB 9.5 (L) 10/24/2023   HCT 30.2 (L) 10/24/2023   MCV 83.2 10/24/2023   PLT 144 (L) 10/24/2023      Chemistry      Component Value Date/Time   NA 142 10/24/2023 0917   NA 141 01/25/2020 1530   K 3.1 (L) 10/24/2023 0917   CL 112 (H) 10/24/2023 0917   CO2 23 10/24/2023 0917   BUN 13 10/24/2023 0917   BUN 19 01/25/2020 1530   CREATININE 0.64 10/24/2023 0917      Component Value Date/Time   CALCIUM 8.2 (L) 10/24/2023 0917   ALKPHOS 60 10/24/2023 0917   AST 13 (L) 10/24/2023 0917   ALT 7 10/24/2023 0917   BILITOT 0.8 10/24/2023 0917      (H): Data is abnormally high   Latest Reference Range & Units Most Recent 12/24/20 14:01 04/01/21 14:34 04/28/21 09:17 06/11/21 09:42 07/03/21 10:32 08/20/21 10:06  M Protein SerPl Elph-Mcnc Not Observed g/dL 1.1 (H) (C) 0/98/11 91:47 Not Observed (C) 0.6 (H) (C) 0.7 (H) (C) 0.5 (H) (C) 0.7 (H) (C) 1.1 (H) (C)    Latest Reference Range & Units Most Recent 09/17/21 08:47 10/15/21 08:25 11/12/21 08:55  Kappa free light chain 3.3 - 19.4 mg/L 5.0 11/12/21 08:55 7.2 7.2 5.0  Lambda free light chains 5.7 - 26.3 mg/L  223.6 (H) 11/12/21 08:55 215.3 (H) 340.2 (H) 223.6 (H)  Kappa, lambda light chain ratio 0.26 - 1.65  0.02 (L) 11/12/21 08:55  0.03 (L) 0.02 (L) 0.02 (L)  (H): Data is abnormally high (L): Data is abnormally low  RADIOGRAPHIC STUDIES: I have personally reviewed the radiological images as listed and agreed with the findings in the report. No results found.   ASSESSMENT & PLAN:  Multiple myeloma not having achieved remission (HCC) # RECURRENT Multiple myeloma stage III [high-risk cytogenetics-status post KRD-VGPR ; status post autologous stem cell transplant on 06/15/18.   MAY 2024- 1.3. ] start 12/27/2022-Isa-Carfil-Dex [[facility]] OCT 24th, 2024- Bone marrow Biopsy: The findings are consistent with  focal minimal residual lambda restricted plasma cell neoplasm.  Most recently on patient 12/27/2022-Isa-Dex; d-2 carfil weekly q 28 days-currently on HOLD ince May 10, 2023.  -Because of good response to therapy/ and Worsening glaucoma/Drop in EF.   # MARCH 2025- M-protein -0.3 ; kappa lambda light chain ratio-1.8 lambda light chain 63-slowly rising.  FEB 2025- Ejection fraction improved.  # Proceed with Isa-Dex-Carfil- Labs-CBC/chemistries were reviewed with the patient.  # DROP in EF:  MARCH 2024-Echo [[facility]] Left ventricular EF 50 to 55%; AUG 13th/OCT 23rd, 2024- EF-45-50%.  FEB 26th, 2025- Left ventricular ejection fraction, by estimation, is 50 to 55%. The left ventricle has low normal function Recommend monitoring for now. Stable.   # Anemia-Hb 9-10- JAn 2025- I ron sat- 8%; ferritin- 19 Proceed with venofer- Stable.   # Electrolyte: Hypokalemia: mild-  continue Kdur TID; hypocalcemia- 8.0- vit D SEP 2024- 50- continie Ergo 50k weekly. REcommend ca BID    sep 2024- Vit D 54. Stable.   # Iatrogenic hypothyroidism [ Graves/goiter s/p total thyroidectomy;aug,2022 ]-currently on Synthroid 150 mcg/day-[increased in AUG, 2024-   Stable.   # Glaucoma BIL- L > R [Dr.King; [facility]] - ? Steroid induced on eye drops. decreased the steroids.  Discussed with ophthalmology- monitor for now.   # Lower back discomfort x 6  months-see above.  MRI lumbar spine as above.  Continue tramadol- refilled . stable  # Bone lesions /last zometa on 11/27/2017.  S/p   Dental extraction PET scan evidence of any bone lesions.- consider Zometa  at next visit.   # Infection prophylaxis: Acyclovir; asprin- add IVIG infusion [IgG < 400]- plan  IVIG- q 4 w #1 on- 9/17-stable;   # IV access: mediport- functional  # Vaccination: Flu shot today [sep 2024] s/p Pneumonia vaccination [march 2024]- stable.  Recommend RSV vaccination.   6 w- given dental extrac- on 1/26. :MM panel; K/l light chain ratio q 4 W  D-isa-dex;car1&2-car; weekly x 3; week 4-OFF;  q 28 day cycle; NO zometa- dental issues-ok; but hypocalemia  # DISPOSITION: #  chemo today;  and tomorrow. Today- venofer- # follow up as planned- 3/31/- add venofer; 4/14- add venofer; # add-  appt for until 4/30  # add cbc.bmp;weekly; and in 4 weeks- MM panel/k-l light chains- Dr.B   Orders Placed This Encounter  Procedures   CBC with Differential (Cancer Center Only)    Standing Status:   Future    Expected Date:   11/22/2023    Expiration Date:   11/21/2024   CMP (Cancer Center only)    Standing Status:   Future    Expected Date:   11/22/2023    Expiration Date:   11/21/2024   CBC with Differential (Cancer Center Only)    Standing Status:   Future  Expected Date:   10/31/2023    Expiration Date:   10/23/2024   Basic metabolic panel with GFR    Standing Status:   Future    Expected Date:   10/31/2023    Expiration Date:   10/23/2024   All questions were answered. The patient knows to call the clinic with any problems, questions or concerns.      Earna Coder, MD 10/24/2023 10:16 AM End

## 2023-10-24 NOTE — Progress Notes (Signed)
 No concerns today

## 2023-10-25 ENCOUNTER — Other Ambulatory Visit: Payer: Self-pay

## 2023-10-25 ENCOUNTER — Inpatient Hospital Stay: Payer: 59 | Attending: Nurse Practitioner

## 2023-10-25 VITALS — BP 142/92 | HR 55 | Temp 98.4°F | Resp 20

## 2023-10-25 DIAGNOSIS — C9002 Multiple myeloma in relapse: Secondary | ICD-10-CM | POA: Insufficient documentation

## 2023-10-25 DIAGNOSIS — Z5112 Encounter for antineoplastic immunotherapy: Secondary | ICD-10-CM | POA: Diagnosis not present

## 2023-10-25 DIAGNOSIS — C9000 Multiple myeloma not having achieved remission: Secondary | ICD-10-CM

## 2023-10-25 MED ORDER — SODIUM CHLORIDE 0.9 % IV SOLN
Freq: Once | INTRAVENOUS | Status: AC
  Filled 2023-10-25: qty 250

## 2023-10-25 MED ORDER — DEXAMETHASONE SODIUM PHOSPHATE 10 MG/ML IJ SOLN
5.0000 mg | Freq: Once | INTRAMUSCULAR | Status: AC
  Administered 2023-10-25: 5 mg via INTRAVENOUS
  Filled 2023-10-25: qty 1

## 2023-10-25 MED ORDER — DEXTROSE 5 % IV SOLN
56.0000 mg/m2 | Freq: Once | INTRAVENOUS | Status: AC
  Administered 2023-10-25: 100 mg via INTRAVENOUS
  Filled 2023-10-25: qty 30

## 2023-10-25 MED ORDER — SODIUM CHLORIDE 0.9% FLUSH
10.0000 mL | INTRAVENOUS | Status: DC | PRN
  Filled 2023-10-25: qty 10

## 2023-10-25 MED ORDER — HEPARIN SOD (PORK) LOCK FLUSH 100 UNIT/ML IV SOLN
500.0000 [IU] | Freq: Once | INTRAVENOUS | Status: AC | PRN
  Administered 2023-10-25: 500 [IU]
  Filled 2023-10-25: qty 5

## 2023-10-25 NOTE — Patient Instructions (Signed)
 [facility] - A DEPT OF [facility]  Discharge Instructions: Thank you for choosing [facility] to provide your oncology and hematology care.  If you have a lab appointment with the Cancer Center, please go directly to the Cancer Center and check in at the registration area.  Wear comfortable clothing and clothing appropriate for easy access to any Portacath or PICC line.   We strive to give you quality time with your provider. You may need to reschedule your appointment if you arrive late (15 or more minutes).  Arriving late affects you and other patients whose appointments are after yours.  Also, if you miss three or more appointments without notifying the office, you may be dismissed from the clinic at the provider's discretion.      For prescription refill requests, have your pharmacy contact our office and allow 72 hours for refills to be completed.    Today you received the following chemotherapy and/or immunotherapy agents Kyprolis      To help prevent nausea and vomiting after your treatment, we encourage you to take your nausea medication as directed.  BELOW ARE SYMPTOMS THAT SHOULD BE REPORTED IMMEDIATELY: *FEVER GREATER THAN 100.4 F (38 C) OR HIGHER *CHILLS OR SWEATING *NAUSEA AND VOMITING THAT IS NOT CONTROLLED WITH YOUR NAUSEA MEDICATION *UNUSUAL SHORTNESS OF BREATH *UNUSUAL BRUISING OR BLEEDING *URINARY PROBLEMS (pain or burning when urinating, or frequent urination) *BOWEL PROBLEMS (unusual diarrhea, constipation, pain near the anus) TENDERNESS IN MOUTH AND THROAT WITH OR WITHOUT PRESENCE OF ULCERS (sore throat, sores in mouth, or a toothache) UNUSUAL RASH, SWELLING OR PAIN  UNUSUAL VAGINAL DISCHARGE OR ITCHING   Items with * indicate a potential emergency and should be followed up as soon as possible or go to the Emergency Department if any problems should occur.  Please show the CHEMOTHERAPY ALERT CARD or IMMUNOTHERAPY  ALERT CARD at check-in to the Emergency Department and triage nurse.  Should you have questions after your visit or need to cancel or reschedule your appointment, please contact [facility] - A DEPT OF [facility]  [phone] and follow the prompts.  Office hours are 8:00 a.m. to 4:30 p.m. Monday - Friday. Please note that voicemails left after 4:00 p.m. may not be returned until the following business day.  We are closed weekends and major holidays. You have access to a nurse at all times for urgent questions. Please call the main number to the clinic [phone] and follow the prompts.  For any non-urgent questions, you may also contact your provider using MyChart. We now offer e-Visits for anyone 36 and older to request care online for non-urgent symptoms. For details visit [email or web address].   Also download the MyChart app! Go to the app store, search "MyChart", open the app, select , and log in with your MyChart username and password.

## 2023-10-31 ENCOUNTER — Inpatient Hospital Stay

## 2023-10-31 DIAGNOSIS — C9000 Multiple myeloma not having achieved remission: Secondary | ICD-10-CM

## 2023-10-31 DIAGNOSIS — Z5112 Encounter for antineoplastic immunotherapy: Secondary | ICD-10-CM | POA: Diagnosis not present

## 2023-10-31 LAB — BASIC METABOLIC PANEL WITH GFR
Anion gap: 4 — ABNORMAL LOW (ref 5–15)
BUN: 13 mg/dL (ref 8–23)
CO2: 21 mmol/L — ABNORMAL LOW (ref 22–32)
Calcium: 7.9 mg/dL — ABNORMAL LOW (ref 8.9–10.3)
Chloride: 112 mmol/L — ABNORMAL HIGH (ref 98–111)
Creatinine, Ser: 0.7 mg/dL (ref 0.44–1.00)
GFR, Estimated: >60 mL/min (ref >60)
Glucose, Bld: 89 mg/dL (ref 70–99)
Potassium: 3.2 mmol/L — ABNORMAL LOW (ref 3.5–5.1)
Sodium: 137 mmol/L (ref 135–145)

## 2023-10-31 LAB — CBC WITH DIFFERENTIAL (CANCER CENTER ONLY)
Abs Immature Granulocytes: 0.01 10*3/uL (ref 0.00–0.07)
Basophils Absolute: 0 10*3/uL (ref 0.0–0.1)
Basophils Relative: 0 %
Eosinophils Absolute: 0.1 10*3/uL (ref 0.0–0.5)
Eosinophils Relative: 3 %
HCT: 32.8 % — ABNORMAL LOW (ref 36.0–46.0)
Hemoglobin: 10 g/dL — ABNORMAL LOW (ref 12.0–15.0)
Immature Granulocytes: 0 %
Lymphocytes Relative: 34 %
Lymphs Abs: 1.1 10*3/uL (ref 0.7–4.0)
MCH: 26.2 pg (ref 26.0–34.0)
MCHC: 30.5 g/dL (ref 30.0–36.0)
MCV: 86.1 fL (ref 80.0–100.0)
Monocytes Absolute: 0.4 10*3/uL (ref 0.1–1.0)
Monocytes Relative: 13 %
Neutro Abs: 1.6 10*3/uL — ABNORMAL LOW (ref 1.7–7.7)
Neutrophils Relative %: 50 %
Platelet Count: 104 10*3/uL — ABNORMAL LOW (ref 150–400)
RBC: 3.81 MIL/uL — ABNORMAL LOW (ref 3.87–5.11)
RDW: 17.9 % — ABNORMAL HIGH (ref 11.5–15.5)
WBC Count: 3.2 10*3/uL — ABNORMAL LOW (ref 4.0–10.5)
nRBC: 0 % (ref 0.0–0.2)

## 2023-11-07 ENCOUNTER — Encounter: Payer: Self-pay | Admitting: Internal Medicine

## 2023-11-07 ENCOUNTER — Inpatient Hospital Stay: Admitting: Internal Medicine

## 2023-11-07 ENCOUNTER — Other Ambulatory Visit: Payer: Self-pay | Admitting: Internal Medicine

## 2023-11-07 ENCOUNTER — Inpatient Hospital Stay

## 2023-11-07 DIAGNOSIS — C9000 Multiple myeloma not having achieved remission: Secondary | ICD-10-CM

## 2023-11-07 NOTE — Progress Notes (Deleted)
  [facility] OFFICE PROGRESS NOTE  Patient Care Team: Leanna Sato, MD as PCP - General (Family Medicine) End, Cristal Deer, MD as PCP - Cardiology (Cardiology) Eddie Candle, [name] Earna Coder, MD as Consulting Physician (Hematology and Oncology) Vida Rigger, MD as Consulting Physician (Pulmonary Disease) Tedd Sias Marlana Salvage, MD as Consulting Physician (Endocrinology)   Cancer Staging  Multiple myeloma not having achieved remission [facility]) Staging form: Plasma Cell Myeloma and Plasma Cell Disorders, AJCC 8th Edition - Clinical: No stage assigned - Unsigned - Clinical: No stage assigned - Unsigned   Oncology History Overview Note  # SEP 2018- MULTIPLE MYELOMA IgALamda [2.5 gm/dl; K/L= 16/1096]; STAGE III [beta 2 microglobulin=5.5] [presented with acute renal failure; anemia; NO hypercalcemia; Skeletal survey-Normal]; BMBx- 45% plasma cells; FISH-POSITIVE 11:14 translocation.[STANDARD-high RISK]/cyto-Normal; SEP 2018- PET- L3 posterior element lesion.   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 No morphologic support for residual myeloma disease. Negative for minimal residual disease by MM-MRD flow cytometry; HOLD REVLIMID [leg swelling]; AUG 2021-PET scan negative for myeloma; continue to hold Revlimid  # MARCH 2021-diastolic congestive heart failure [Dr.End]  # BMBx- OCT 2021- BMBx- 5% plasmacytosis-however this appears to be  more polyclonal rather than monoclonal-not explain patient worsening anemia.   # OCT 2022- 18th- Dara- Rev-Dex; MARCH, 2024- PROGRESSION bone marrow biopsy 70% plasma cells; PET scan no bone lesions-however conus medullaris uptake-MRI lumbar spine-   # MAY 2024- DISCONTINUE carfilzomib-dexamethasone- Pomalyst; [rising M-protein; MAY 2024- 1.3. ]   # start 12/27/2022 -Isa-Carfil-Dex;  [decrease dexamethasone 20 weekly- starting 05/04/23- sec to glaucoma].   # OCT 24th, 2024- Bone marrow Biopsy: The findings are consistent with  focal minimal residual lambda restricted plasma cell neoplasm- HOLD treatment sec to glaucoma; and drop in EF AUG 2024- 45-50% [March 2024- 50-55%]  #November 2021-hyperthyroidism/goiter- [D.Bennett/Dr.Solum]-methimazole. S/p Thyroidectomy [Dr.Kim; [facility]-AUG 2022]  --------------------------  # 12/12- RIGHT JUGULAR DVT-x 7m on xarelto; finished April 2020; September 2020-EGD/dysphagia; Dr. Tobi Bastos  # Acute renal failure [Dr.Singh; Proteinuria 1.5gm/day ]; acyclovir/Asprin ------------------------------------------------------------------------------------------------------------   DIAGNOSIS: [ ]  MULTIPLE MYELOMA  STAGE: III/HIGH RISK ;GOALS: CONTROL      Multiple myeloma not having achieved remission (HCC)  11/21/2017 - 04/28/2018 Chemotherapy   Patient is on Treatment Plan : MYELOMA SALVAGE Cyclophosphamide / Carfilzomib / Dexamethasone (CCd) q28d     05/12/2021 - 02/25/2022 Chemotherapy   Patient is on Treatment Plan : MYELOMA RELAPSED REFRACTORY Daratumumab SQ + Lenalidomide + Dexamethasone (DaraRd) q28d     05/12/2021 - 07/23/2022 Chemotherapy   Patient is on Treatment Plan : MYELOMA RELAPSED REFRACTORY Daratumumab SQ + Lenalidomide + Dexamethasone (DaraRd) q28d     11/02/2022 - 11/16/2022 Chemotherapy   Patient is on Treatment Plan : MYELOMA RELAPSED/ REFRACTORY Carfilzomib D1,8,15 (20/27) + Pomalidomide + Dexamethasone (KPd) q28d     12/27/2022 -  Chemotherapy    Patient is on Treatment Plan : MYELOMA Carfilzomib + Dexamethasone + Isatuximab q28d      INTERVAL HISTORY: Ambulating  independently.   Alone.  Brandi [name] 73 y.o.  female pleasant patient above history of relapsed multiple myeloma--most recently on  Isa- Carfil-Dex  is here for a follow up.   No fevers or chills. No infections. Denies any worsening pain.  Patient taking tramadol as needed for upper and leg cramps. Mild tingling in feet.    Review of Systems  Constitutional:  Positive for malaise/fatigue. Negative for chills, diaphoresis and fever.  HENT:  Negative for nosebleeds and sore throat.   Eyes:  Negative for double vision.  Respiratory:  Negative for hemoptysis.   Cardiovascular:  Negative for chest pain, palpitations and orthopnea.  Gastrointestinal:  Negative for abdominal pain, blood in stool, constipation, heartburn and melena.  Genitourinary:  Negative for dysuria, frequency and urgency.  Musculoskeletal:  Positive for back pain.  Skin: Negative.  Negative for itching and rash.  Neurological:  Negative for dizziness, tingling, focal weakness, weakness and headaches.  Endo/Heme/Allergies:  Does not bruise/bleed easily.  Psychiatric/Behavioral:  Negative for depression. The patient is not nervous/anxious.     PAST MEDICAL HISTORY :  Past Medical History:  Diagnosis Date  . (HFpEF) heart failure with preserved ejection fraction (HCC)    a. 06/2020 Echo: EF 60-65%, no rwma, nl RV size/fxn. Triv MR. Triv TR/AI. Mod elev PASP.  Aaron Aas Anxiety   . COPD (chronic obstructive pulmonary disease) (HCC)   . GERD (gastroesophageal reflux disease)   . Hypertension   . Hyperthyroidism   . Hypokalemia   . IgA myeloma (HCC)   . Multiple myeloma (HCC)   . Pneumonia   . Red blood cell antibody positive, compatible PRBC difficult to obtain   . S/P autologous bone marrow transplantation (HCC)     PAST SURGICAL HISTORY :   Past Surgical History:  Procedure Laterality Date  .  ANTERIOR VITRECTOMY Left 10/07/2021   Procedure: ANTERIOR VITRECTOMY;  Surgeon: Annell Kidney, MD;  Location: [facility];  Service: Ophthalmology;  Laterality: Left;  . CATARACT EXTRACTION W/PHACO Left 10/07/2021   Procedure: CATARACT EXTRACTION PHACO AND INTRAOCULAR LENS PLACEMENT (IOC) LEFT VISION BLUE;  Surgeon: Annell Kidney, MD;  Location: [facility];  Service: Ophthalmology;  Laterality: Left;  kit for manual incision 14.56 01:21.4  . CATARACT EXTRACTION W/PHACO Right 10/21/2021   Procedure: CATARACT EXTRACTION PHACO AND INTRAOCULAR LENS PLACEMENT (IOC) RIGHT 11.12 01:48.1;  Surgeon: Annell Kidney, MD;  Location: [facility];  Service: Ophthalmology;  Laterality: Right;  . ESOPHAGOGASTRODUODENOSCOPY (EGD) WITH PROPOFOL N/A 03/30/2019   Procedure: ESOPHAGOGASTRODUODENOSCOPY (EGD) WITH PROPOFOL;  Surgeon: Luke Salaam, MD;  Location[facility];  Service: Gastroenterology;  Laterality: N/A;  . IR BONE MARROW BIOPSY & ASPIRATION  05/25/2023  . IR FLUORO GUIDE PORT INSERTION RIGHT  12/16/2017  . IR IMAGING GUIDED PORT INSERTION  06/15/2021  . KNEE ARTHROSCOPY Left 09/03/2022   Procedure: ARTHROSCOPY KNEE DEBRIDEMENT;  Surgeon: Jerlyn Moons, MD;  Location: [facility] ORS;  Service: Orthopedics;  Laterality: Left;  . THYROIDECTOMY N/A     FAMILY HISTORY :   Family History  Problem Relation Age of Onset  . Pneumonia Mother   . Seizures Father     SOCIAL HISTORY:   Social History   Tobacco Use  . Smoking status: Former    Current packs/day: 0.00    Types: Cigarettes    Quit date: 03/18/2021    Years since quitting: 2.6  . Smokeless tobacco: Never  . Tobacco comments:    2 cigarettes QOD  Vaping Use  . Vaping status: Never Used  Substance Use Topics  . Alcohol  use: No  . Drug use: No    ALLERGIES:  has no known allergies.  MEDICATIONS:  Current Outpatient Medications  Medication Sig Dispense Refill  . acyclovir (ZOVIRAX) 400 MG  tablet Take 1 tablet (400 mg total) by mouth 2 (two) times daily. 60 tablet 6  . albuterol (VENTOLIN HFA) 108 (90 Base) MCG/ACT inhaler Inhale 2 puffs into the lungs every 6 (six) hours as needed for wheezing or shortness of breath. 8 g 3  . aspirin EC 81 MG EC tablet Take 1 tablet (81 mg total) by mouth daily. 90 tablet 3  . brimonidine (ALPHAGAN P) 0.1 % SOLN Place 1 drop into both eyes 2 (two) times daily.    . Brinzolamide-Brimonidine (SIMBRINZA) 1-0.2 % SUSP Apply 1 drop to eye in the morning and at bedtime. I drop both eyes twice a day (Patient not taking: Reported on 10/24/2023)    . calcium carbonate (CALCIUM 600 HIGH POTENCY) 600 MG TABS tablet Take 2 tablets (1,200 mg total) by mouth 2 (two) times daily. 120 tablet 2  . carvedilol (COREG) 3.125 MG tablet Take 1 tablet (3.125 mg total) by mouth 2 (two) times daily with a meal. 60 tablet 5  . furosemide (LASIX) 40 MG tablet Take 1 tablet (40 mg total) by mouth daily. 90 tablet 3  . Latanoprostene Bunod (VYZULTA) 0.024 % SOLN Apply 1 drop to eye at bedtime. 1 drop Left eye every night (Patient not taking: Reported on 10/24/2023)    . levothyroxine (SYNTHROID) 175 MCG tablet Take 1 tablet (175 mcg total) by mouth daily. For thyroid 90 tablet 1  . lidocaine-prilocaine (EMLA) cream Apply 1 application topically as needed. Apply to port and cover with saran wrap 1-2 hours prior to port access 30 g 1  . magnesium oxide (MAG-OX) 400 (240 Mg) MG tablet Take 1 tablet (400 mg total) by mouth 2 (two) times daily. 60 tablet 3  . montelukast (SINGULAIR) 10 MG tablet Take 1 tablet (10 mg total) by mouth at bedtime. 30 tablet 3  . ondansetron (ZOFRAN) 8 MG tablet One pill every 8 hours as needed for nausea/vomitting. 40 tablet 1  . pantoprazole (PROTONIX) 40 MG tablet Take 1 tablet (40 mg total) by mouth daily. 90 tablet 0  . polyethylene glycol (MIRALAX / GLYCOLAX) packet Take 17 g by mouth daily as needed for mild constipation. 14 each 0  . potassium  chloride SA (KLOR-CON M) 20 MEQ tablet Take 3 tablets (60 mEq total) by mouth daily. For low potassium 90 tablet 2  . spironolactone (ALDACTONE) 25 MG tablet Take 1 tablet by mouth once daily 90 tablet 3  . traMADol (ULTRAM) 50 MG tablet Take 1 tablet (50 mg total) by mouth every 12 (twelve) hours as needed. 60 tablet 0  . Vitamin D, Ergocalciferol, (DRISDOL) 1.25 MG (50000 UNIT) CAPS capsule Take 1 capsule by mouth once a week 12 capsule 0   No current facility-administered medications for this visit.   Facility-Administered Medications Ordered in Other Visits  Medication Dose Route Frequency Provider Last Rate Last Admin  . 0.9 %  sodium chloride infusion   Intravenous Continuous Geary Kells, PA-C        PHYSICAL EXAMINATION:   There were no vitals taken for this visit.  There were no vitals filed for this visit.       Mild leg swelling.   Physical Exam HENT:     Head: Normocephalic and atraumatic.  Eyes:     Pupils: Pupils are equal,  round, and reactive to light.  Cardiovascular:     Rate and Rhythm: Normal rate and regular rhythm.  Pulmonary:     Effort: Pulmonary effort is normal. No respiratory distress.     Breath sounds: Normal breath sounds. No wheezing.  Abdominal:     General: Bowel sounds are normal. There is no distension.     Palpations: Abdomen is soft. There is no mass.     Tenderness: There is no abdominal tenderness. There is no guarding or rebound.  Musculoskeletal:        General: No tenderness. Normal range of motion.     Cervical back: Normal range of motion and neck supple.  Skin:    General: Skin is warm.  Neurological:     Mental Status: She is alert and oriented to person, place, and time.  Psychiatric:        Mood and Affect: Affect normal.   LABORATORY DATA:  I have reviewed the data as listed    Component Value Date/Time   NA 137 10/31/2023 0934   NA 141 01/25/2020 1530   K 3.2 (L) 10/31/2023 0934   CL 112 (H) 10/31/2023 0934    CO2 21 (L) 10/31/2023 0934   GLUCOSE 89 10/31/2023 0934   BUN 13 10/31/2023 0934   BUN 19 01/25/2020 1530   CREATININE 0.70 10/31/2023 0934   CREATININE 0.64 10/24/2023 0917   CALCIUM 7.9 (L) 10/31/2023 0934   PROT 6.3 (L) 10/24/2023 0917   ALBUMIN 3.6 10/24/2023 0917   AST 13 (L) 10/24/2023 0917   ALT 7 10/24/2023 0917   ALKPHOS 60 10/24/2023 0917   BILITOT 0.8 10/24/2023 0917   GFRNONAA >60 10/31/2023 0934   GFRNONAA >60 10/24/2023 0917   GFRAA NOT CALCULATED 04/17/2020 1352    No results found for: "SPEP", "UPEP"  Lab Results  Component Value Date   WBC 3.2 (L) 10/31/2023   NEUTROABS 1.6 (L) 10/31/2023   HGB 10.0 (L) 10/31/2023   HCT 32.8 (L) 10/31/2023   MCV 86.1 10/31/2023   PLT 104 (L) 10/31/2023      Chemistry      Component Value Date/Time   NA 137 10/31/2023 0934   NA 141 01/25/2020 1530   K 3.2 (L) 10/31/2023 0934   CL 112 (H) 10/31/2023 0934   CO2 21 (L) 10/31/2023 0934   BUN 13 10/31/2023 0934   BUN 19 01/25/2020 1530   CREATININE 0.70 10/31/2023 0934   CREATININE 0.64 10/24/2023 0917      Component Value Date/Time   CALCIUM 7.9 (L) 10/31/2023 0934   ALKPHOS 60 10/24/2023 0917   AST 13 (L) 10/24/2023 0917   ALT 7 10/24/2023 0917   BILITOT 0.8 10/24/2023 0917      (H): Data is abnormally high   Latest Reference Range & Units Most Recent 12/24/20 14:01 04/01/21 14:34 04/28/21 09:17 06/11/21 09:42 07/03/21 10:32 08/20/21 10:06  M Protein SerPl Elph-Mcnc Not Observed g/dL 1.1 (H) (C) 9/60/45 40:98 Not Observed (C) 0.6 (H) (C) 0.7 (H) (C) 0.5 (H) (C) 0.7 (H) (C) 1.1 (H) (C)    Latest Reference Range & Units Most Recent 09/17/21 08:47 10/15/21 08:25 11/12/21 08:55  Kappa free light chain 3.3 - 19.4 mg/L 5.0 11/12/21 08:55 7.2 7.2 5.0  Lambda free light chains 5.7 - 26.3 mg/L 223.6 (H) 11/12/21 08:55 215.3 (H) 340.2 (H) 223.6 (H)  Kappa, lambda light chain ratio 0.26 - 1.65  0.02 (L) 11/12/21 08:55 0.03 (L) 0.02 (L) 0.02 (L)  (H): Data  is abnormally  high (L): Data is abnormally low  RADIOGRAPHIC STUDIES: I have personally reviewed the radiological images as listed and agreed with the findings in the report. No results found.   ASSESSMENT & PLAN:  No problem-specific Assessment & Plan notes found for this encounter.   No orders of the defined types were placed in this encounter.  All questions were answered. The patient knows to call the clinic with any problems, questions or concerns.      [name], MD 11/07/2023 8:38 AM End

## 2023-11-07 NOTE — Assessment & Plan Note (Deleted)
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   MAY 2024- 1.3. ] start 12/27/2022-Isa-Carfil-Dex [[facility]] OCT 24th, 2024- Bone marrow Biopsy: The findings are consistent with  focal minimal residual lambda restricted plasma cell neoplasm.  Most recently on patient 12/27/2022-Isa-Dex; d-2 carfil weekly q 28 days-currently on HOLD ince May 10, 2023.  -Because of good response to therapy/ and Worsening glaucoma/Drop in EF.   # MARCH 2025- M-protein -0.3 ; kappa lambda light chain ratio-1.8 lambda light chain 63-slowly rising.  FEB 2025- Ejection fraction improved.  # Proceed with Isa-Dex-Carfil- Labs-CBC/chemistries were reviewed with the patient.  # DROP in EF:  MARCH 2024-Echo [[facility]] Left ventricular EF 50 to 55%; AUG 13th/OCT 23rd, 2024- EF-45-50%.  FEB 26th, 2025- Left ventricular ejection fraction, by estimation, is 50 to 55%. The left ventricle has low normal function Recommend monitoring for now. Stable.   # Anemia-Hb 9-10- JAn 2025- I ron sat- 8%; ferritin- 19 Proceed with venofer- Stable.   # Electrolyte: Hypokalemia: mild-  continue Kdur TID; hypocalcemia- 8.0- vit D SEP 2024- 50- continie Ergo 50k weekly. REcommend ca BID    sep 2024- Vit D 54. Stable.   # Iatrogenic hypothyroidism [ Graves/goiter s/p total thyroidectomy;aug,2022 ]-currently on Synthroid 150 mcg/day-[increased in AUG, 2024-   Stable.   # Glaucoma BIL- L > R [Dr.King; [facility]] - ? Steroid induced on eye drops. decreased the steroids.  Discussed with ophthalmology- monitor for now.   # Lower back discomfort x 6 months-see above.  MRI lumbar spine as above.  Continue tramadol- refilled . stable  # Bone lesions /last zometa on 11/27/2017.  S/p   Dental extraction PET scan evidence of any bone lesions.- consider Zometa  at next visit.   # Infection prophylaxis: Acyclovir; asprin- add IVIG infusion [IgG < 400]- plan  IVIG- q 4 w  #1 on- 9/17-stable;   # IV access: mediport- functional  # Vaccination: Flu shot today [sep 2024] s/p Pneumonia vaccination [march 2024]- stable.  Recommend RSV vaccination.   6 w- given dental extrac- on 1/26. :MM panel; K/l light chain ratio q 4 W  D-isa-dex;car1&2-car; weekly x 3; week 4-OFF;  q 28 day cycle; NO zometa- dental issues-ok; but hypocalemia  # DISPOSITION: #  chemo today;  and tomorrow. Today- venofer- # follow up as planned- 3/31/- add venofer; 4/14- add venofer; # add-  appt for until 4/30  # add cbc.bmp;weekly; and in 4 weeks- MM panel/k-l light chains- Dr.B

## 2023-11-08 ENCOUNTER — Ambulatory Visit

## 2023-11-08 ENCOUNTER — Other Ambulatory Visit: Payer: Self-pay

## 2023-11-08 ENCOUNTER — Inpatient Hospital Stay

## 2023-11-14 ENCOUNTER — Inpatient Hospital Stay

## 2023-11-14 ENCOUNTER — Other Ambulatory Visit

## 2023-11-14 ENCOUNTER — Inpatient Hospital Stay (HOSPITAL_BASED_OUTPATIENT_CLINIC_OR_DEPARTMENT_OTHER): Admitting: Internal Medicine

## 2023-11-14 ENCOUNTER — Ambulatory Visit

## 2023-11-14 ENCOUNTER — Ambulatory Visit: Admitting: Internal Medicine

## 2023-11-14 ENCOUNTER — Encounter: Payer: Self-pay | Admitting: Internal Medicine

## 2023-11-14 VITALS — BP 150/70 | HR 64

## 2023-11-14 VITALS — BP 152/82 | HR 69 | Temp 99.0°F | Resp 20 | Ht 62.0 in | Wt 156.8 lb

## 2023-11-14 DIAGNOSIS — C9000 Multiple myeloma not having achieved remission: Secondary | ICD-10-CM

## 2023-11-14 DIAGNOSIS — Z5112 Encounter for antineoplastic immunotherapy: Secondary | ICD-10-CM | POA: Diagnosis not present

## 2023-11-14 LAB — CMP (CANCER CENTER ONLY)
ALT: 8 U/L (ref 0–44)
AST: 16 U/L (ref 15–41)
Albumin: 3.9 g/dL (ref 3.5–5.0)
Alkaline Phosphatase: 78 U/L (ref 38–126)
Anion gap: 10 (ref 5–15)
BUN: 14 mg/dL (ref 8–23)
CO2: 22 mmol/L (ref 22–32)
Calcium: 8.1 mg/dL — ABNORMAL LOW (ref 8.9–10.3)
Chloride: 110 mmol/L (ref 98–111)
Creatinine: 0.53 mg/dL (ref 0.44–1.00)
GFR, Estimated: >60 mL/min (ref >60)
Glucose, Bld: 103 mg/dL — ABNORMAL HIGH (ref 70–99)
Potassium: 2.8 mmol/L — ABNORMAL LOW (ref 3.5–5.1)
Sodium: 142 mmol/L (ref 135–145)
Total Bilirubin: 0.5 mg/dL (ref 0.0–1.2)
Total Protein: 6.5 g/dL (ref 6.5–8.1)

## 2023-11-14 LAB — CBC WITH DIFFERENTIAL (CANCER CENTER ONLY)
Abs Immature Granulocytes: 0.01 10*3/uL (ref 0.00–0.07)
Basophils Absolute: 0 10*3/uL (ref 0.0–0.1)
Basophils Relative: 0 %
Eosinophils Absolute: 0.1 10*3/uL (ref 0.0–0.5)
Eosinophils Relative: 1 %
HCT: 33.1 % — ABNORMAL LOW (ref 36.0–46.0)
Hemoglobin: 10.4 g/dL — ABNORMAL LOW (ref 12.0–15.0)
Immature Granulocytes: 0 %
Lymphocytes Relative: 35 %
Lymphs Abs: 1.7 10*3/uL (ref 0.7–4.0)
MCH: 27.5 pg (ref 26.0–34.0)
MCHC: 31.4 g/dL (ref 30.0–36.0)
MCV: 87.6 fL (ref 80.0–100.0)
Monocytes Absolute: 0.3 10*3/uL (ref 0.1–1.0)
Monocytes Relative: 7 %
Neutro Abs: 2.7 10*3/uL (ref 1.7–7.7)
Neutrophils Relative %: 57 %
Platelet Count: 206 10*3/uL (ref 150–400)
RBC: 3.78 MIL/uL — ABNORMAL LOW (ref 3.87–5.11)
RDW: 16.8 % — ABNORMAL HIGH (ref 11.5–15.5)
WBC Count: 4.9 10*3/uL (ref 4.0–10.5)
nRBC: 0 % (ref 0.0–0.2)

## 2023-11-14 MED ORDER — SODIUM CHLORIDE 0.9 % IV SOLN
Freq: Once | INTRAVENOUS | Status: AC
  Filled 2023-11-14: qty 250

## 2023-11-14 MED ORDER — DIPHENHYDRAMINE HCL 50 MG/ML IJ SOLN
50.0000 mg | Freq: Once | INTRAMUSCULAR | Status: AC
  Administered 2023-11-14: 50 mg via INTRAVENOUS
  Filled 2023-11-14: qty 1

## 2023-11-14 MED ORDER — DEXAMETHASONE SODIUM PHOSPHATE 10 MG/ML IJ SOLN
5.0000 mg | Freq: Once | INTRAMUSCULAR | Status: AC
Start: 2023-11-14 — End: 2023-11-14
  Administered 2023-11-14: 5 mg via INTRAVENOUS
  Filled 2023-11-14: qty 1

## 2023-11-14 MED ORDER — ACETAMINOPHEN 325 MG PO TABS
650.0000 mg | ORAL_TABLET | Freq: Once | ORAL | Status: AC
  Administered 2023-11-14: 650 mg via ORAL
  Filled 2023-11-14: qty 2

## 2023-11-14 MED ORDER — SODIUM CHLORIDE 0.9 % IV SOLN
10.0000 mg/kg | Freq: Once | INTRAVENOUS | Status: AC
  Administered 2023-11-14: 800 mg via INTRAVENOUS
  Filled 2023-11-14: qty 40

## 2023-11-14 MED ORDER — FAMOTIDINE IN NACL 20-0.9 MG/50ML-% IV SOLN
20.0000 mg | Freq: Once | INTRAVENOUS | Status: AC
  Administered 2023-11-14: 20 mg via INTRAVENOUS
  Filled 2023-11-14: qty 50

## 2023-11-14 MED ORDER — DEXTROSE 5 % IV SOLN
56.0000 mg/m2 | Freq: Once | INTRAVENOUS | Status: AC
  Administered 2023-11-14: 100 mg via INTRAVENOUS
  Filled 2023-11-14: qty 30

## 2023-11-14 MED ORDER — HEPARIN SOD (PORK) LOCK FLUSH 100 UNIT/ML IV SOLN
500.0000 [IU] | Freq: Once | INTRAVENOUS | Status: AC | PRN
  Administered 2023-11-14: 500 [IU]
  Filled 2023-11-14: qty 5

## 2023-11-14 NOTE — Progress Notes (Signed)
 No concerns today

## 2023-11-14 NOTE — Assessment & Plan Note (Addendum)
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   MAY 2024- 1.3. ]  currently on Isa-Dex; d-2 carfil weekly q 28 days.  # MARCH 2025- M-protein -0.3 ; kappa lambda light chain ratio-1.8 lambda light chain 63-slowly RISINSG - monitor for now. FEB 2025- Ejection fraction improved.  # Proceed with Isa-Dex-Carfil- Labs-CBC/chemistries were reviewed with the patient.  # DROP in EF:  MARCH 2024-Echo [[facility]] Left ventricular EF 50 to 55%; AUG 13th/OCT 23rd, 2024- EF-45-50%.  FEB 26th, 2025- Left ventricular ejection fraction, by estimation, is 50 to 55%. The left ventricle has low normal function Recommend monitoring for now. Stable.   # Anemia-Hb 9-10- JAn 2025- I ron sat- 8%; ferritin- 19 on prn venofer - Stable.   # Electrolyte: Hypokalemia: mild-  continue compliance Kdur TID-; hypocalcemia- 8.0- vit D SEP 2024- 50- continie Ergo 50k weekly. continue ca BID    sep 2024- Vit D 54. Stable.   # Iatrogenic hypothyroidism [ Graves/goiter s/p total thyroidectomy;aug,2022 ]-currently on Synthroid  150 mcg/day-[increased in AUG, 2024-   Stable.   # Glaucoma BIL- L > R [Dr.King; [place] [facility]] - ? Steroid induced on eye drops. decreased the steroids.  Discussed with ophthalmology- monitor for now.  Stable.  # Lower back discomfort x 6 months-see above.  MRI lumbar spine as above.  Continue tramadol - refilled . stable  # Bone lesions /last zometa  on 11/27/2017.  S/p   Dental extraction PET scan evidence of any bone lesions.- consider Zometa   at next visit.   # Infection prophylaxis: Acyclovir ; asprin- add IVIG infusion [IgG < 400]- plan  IVIG- q 4 w #1 on- 9/17- stable   # IV access: mediport- functional  # Vaccination: Flu shot today [sep 2024] s/p Pneumonia vaccination [march 2024]- stable.  Recommend RSV vaccination.   6 w- given dental extrac- on 1/26. :MM panel; K/l light chain ratio q 4  W  D-isa-dex;car1&2-car; weekly x 3; week 4-OFF;  q 28 day cycle; NO zometa - dental issues-ok; but hypocalemia  PS:  # DISPOSITION: #  chemo today;  and tomorrow. Today- venofer - # follow up as planned-     # add-  appt for until 5/19-5/20 # add cbc.bmp;weekly; and in 4 weeks- MM panel/k-l light chains- Dr.B

## 2023-11-14 NOTE — Progress Notes (Signed)
 [name] OFFICE PROGRESS NOTE  Patient Care Team: [name], MD as PCP - General (Family Medicine) End, [name], MD as PCP - Cardiology (Cardiology) [name], [name] [name], MD as Consulting Physician (Hematology and Oncology) [name], MD as Consulting Physician (Pulmonary Disease) [name], MD as Consulting Physician (Endocrinology)   Cancer Staging  Multiple myeloma not having achieved remission [facility]) Staging form: Plasma Cell Myeloma and Plasma Cell Disorders, AJCC 8th Edition - Clinical: No stage assigned - Unsigned - Clinical: No stage assigned - Unsigned   Oncology History Overview Note  # SEP 2018- MULTIPLE MYELOMA IgALamda [2.5 gm/dl; K/L= 38/7564]; STAGE III [beta 2 microglobulin=5.5] [presented with acute renal failure; anemia; NO hypercalcemia; Skeletal survey-Normal]; BMBx- 45% plasma cells; FISH-POSITIVE 11:14 translocation.[STANDARD-high RISK]/cyto-Normal; SEP 2018- PET- L3 posterior element lesion.   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 No morphologic support for residual myeloma disease. Negative for minimal residual disease by MM-MRD flow cytometry; HOLD REVLIMID  [leg swelling]; AUG 2021-PET scan negative for myeloma; continue to hold Revlimid   # MARCH 2021-diastolic congestive heart failure [Dr.End]  # BMBx- OCT 2021- BMBx- 5% plasmacytosis-however this appears to be  more polyclonal rather than monoclonal-not explain patient worsening anemia.   # OCT 2022- 18th- Dara- Rev-Dex; MARCH, 2024- PROGRESSION bone marrow biopsy 70% plasma cells; PET scan no bone lesions-however conus medullaris uptake-MRI lumbar spine-   # MAY 2024- DISCONTINUE carfilzomib -dexamethasone - Pomalyst ; [rising M-protein; MAY 2024- 1.3. ]   # start 12/27/2022 -Isa-Carfil-Dex;  [decrease dexamethasone  20 weekly- starting 05/04/23- sec to glaucoma].   # OCT 24th, 2024- Bone marrow Biopsy: The findings are consistent with  focal minimal residual lambda restricted plasma cell neoplasm- HOLD treatment sec to glaucoma; and drop in EF AUG 2024- 45-50% [March 2024- 50-55%]  # MARCH 16th, 2025- re-started Isa-Carfil-Dex;  #November 2021-hyperthyroidism/goiter- [D.Bennett/Dr.Solum]-methimazole . S/p Thyroidectomy [Dr.Kim; [facility]-AUG 2022]  --------------------------  # 12/12- RIGHT JUGULAR DVT-x 52m on xarelto ; finished April 2020; September 2020-EGD/dysphagia; Dr. Antony Baumgartner  # Acute renal failure [Dr.Singh; Proteinuria 1.5gm/day ]; Gomez Lathe Lavell Portugal ------------------------------------------------------------------------------------------------------------   DIAGNOSIS: [ ]  MULTIPLE MYELOMA  STAGE: III/HIGH RISK ;GOALS: CONTROL      Multiple myeloma not having achieved remission (HCC)  11/21/2017 - 04/28/2018 Chemotherapy   Patient is on Treatment Plan : MYELOMA SALVAGE Cyclophosphamide  / Carfilzomib  / Dexamethasone  (CCd) q28d     05/12/2021 - 02/25/2022 Chemotherapy   Patient is on Treatment Plan : MYELOMA RELAPSED REFRACTORY Daratumumab  SQ + Lenalidomide  + Dexamethasone  (DaraRd) q28d     05/12/2021 - 07/23/2022 Chemotherapy   Patient is on Treatment Plan : MYELOMA RELAPSED REFRACTORY Daratumumab  SQ + Lenalidomide  + Dexamethasone  (DaraRd) q28d     11/02/2022 - 11/16/2022 Chemotherapy   Patient is on Treatment Plan : MYELOMA RELAPSED/ REFRACTORY Carfilzomib  D1,8,15 (20/27) + Pomalidomide  +  Dexamethasone  (KPd) q28d     12/27/2022 -  Chemotherapy   Patient is on Treatment Plan : MYELOMA Carfilzomib  + Dexamethasone  + Isatuximab  q28d      INTERVAL HISTORY: Ambulating  independently.  Alone.  Brandi [name] 73 y.o.  female pleasant patient above history of relapsed multiple myeloma--most recently on  Isa- Carfil-Dex  is here for a follow up.  No fevers or chills. No infections. Denies any worsening pain.  Patient taking tramadol  as needed for upper and leg cramps. Mild tingling in feet.    Review of Systems  Constitutional:  Positive for malaise/fatigue. Negative for chills, diaphoresis and fever.  HENT:  Negative for nosebleeds and sore throat.   Eyes:  Negative for double vision.  Respiratory:  Negative for hemoptysis.   Cardiovascular:  Negative for chest pain, palpitations and orthopnea.  Gastrointestinal:  Negative for abdominal pain, blood in stool, constipation, heartburn and melena.  Genitourinary:  Negative for dysuria, frequency and urgency.  Musculoskeletal:  Positive for back pain.  Skin: Negative.  Negative for itching and rash.  Neurological:  Negative for dizziness, tingling, focal weakness, weakness and headaches.  Endo/Heme/Allergies:  Does not bruise/bleed easily.  Psychiatric/Behavioral:  Negative for depression. The patient is not nervous/anxious.     PAST MEDICAL HISTORY :  Past Medical History:  Diagnosis Date   (HFpEF) heart failure with preserved ejection fraction (HCC)    a. 06/2020 Echo: EF 60-65%, no rwma, nl RV size/fxn. Triv MR. Triv TR/AI. Mod elev PASP.   Anxiety    COPD (chronic obstructive pulmonary disease) (HCC)    GERD (gastroesophageal reflux disease)    Hypertension    Hyperthyroidism    Hypokalemia    IgA myeloma (HCC)    Multiple myeloma (HCC)    Pneumonia    Red blood cell antibody positive, compatible PRBC difficult to obtain    S/P autologous bone marrow transplantation (HCC)     PAST SURGICAL HISTORY :   Past  Surgical History:  Procedure Laterality Date   ANTERIOR VITRECTOMY Left 10/07/2021   Procedure: ANTERIOR VITRECTOMY;  Surgeon: Annell Kidney, MD;  Location: [facility];  Service: Ophthalmology;  Laterality: Left;   CATARACT EXTRACTION W/PHACO Left 10/07/2021   Procedure: CATARACT EXTRACTION PHACO AND INTRAOCULAR LENS PLACEMENT (IOC) LEFT VISION BLUE;  Surgeon: Annell Kidney, MD;  Location: [facility];  Service: Ophthalmology;  Laterality: Left;  kit for manual incision 14.56 01:21.4   CATARACT EXTRACTION W/PHACO Right 10/21/2021   Procedure: CATARACT EXTRACTION PHACO AND INTRAOCULAR LENS PLACEMENT (IOC) RIGHT 11.12 01:48.1;  Surgeon: Annell Kidney, MD;  Location: [facility];  Service: Ophthalmology;  Laterality: Right;   ESOPHAGOGASTRODUODENOSCOPY (EGD) WITH PROPOFOL  N/A 03/30/2019   Procedure: ESOPHAGOGASTRODUODENOSCOPY (EGD) WITH PROPOFOL ;  Surgeon: Luke Salaam, MD;  Location: [facility];  Service: Gastroenterology;  Laterality: N/A;   IR BONE MARROW BIOPSY & ASPIRATION  05/25/2023   IR FLUORO GUIDE PORT INSERTION RIGHT  12/16/2017   IR IMAGING GUIDED PORT INSERTION  06/15/2021   KNEE ARTHROSCOPY Left 09/03/2022   Procedure: ARTHROSCOPY KNEE DEBRIDEMENT;  Surgeon: Jerlyn Moons, MD;  Location: [facility] ORS;  Service: Orthopedics;  Laterality: Left;   THYROIDECTOMY N/A     FAMILY HISTORY :   Family History  Problem Relation Age of Onset   Pneumonia Mother    Seizures Father     SOCIAL HISTORY:   Social History   Tobacco Use   Smoking status: Former    Current packs/day: 0.00    Types: Cigarettes    Quit date: 03/18/2021    Years since quitting: 2.6   Smokeless tobacco: Never   Tobacco comments:    2 cigarettes QOD  Vaping Use   Vaping status: Never Used  Substance Use Topics   Alcohol use: No   Drug use: No    ALLERGIES:  has no known allergies.  MEDICATIONS:  Current Outpatient Medications  Medication Sig Dispense Refill    acyclovir  (ZOVIRAX ) 400 MG tablet Take 1 tablet (400 mg total) by mouth 2 (two) times daily. 60 tablet 6   albuterol  (VENTOLIN  HFA) 108 (90 Base) MCG/ACT inhaler Inhale 2 puffs into the lungs every 6 (six) hours as needed for wheezing or shortness of breath. 8 g 3   aspirin  EC 81 MG EC tablet Take 1 tablet (81 mg total) by mouth daily. 90 tablet 3   brimonidine  (ALPHAGAN  P) 0.1 % SOLN Place 1 drop into both eyes 2 (two) times daily.     calcium  carbonate (CALCIUM  600 HIGH POTENCY) 600 MG TABS tablet Take 2 tablets (1,200 mg total) by mouth 2 (two) times daily. 120 tablet 2   carvedilol  (COREG ) 3.125 MG tablet Take 1 tablet (3.125 mg total) by mouth 2 (two) times daily with a meal. 60 tablet 5   furosemide  (LASIX ) 40 MG tablet Take 1 tablet (40 mg total) by mouth daily. 90 tablet 3   levothyroxine  (SYNTHROID ) 175 MCG tablet Take 1 tablet (175 mcg total) by mouth daily. For thyroid  90 tablet 1   lidocaine -prilocaine  (EMLA ) cream Apply 1 application topically as needed. Apply to port and cover with saran wrap 1-2 hours prior to port access 30 g 1   magnesium  oxide (MAG-OX) 400 (240 Mg) MG tablet Take 1 tablet (400 mg total) by mouth 2 (two) times daily. 60 tablet 3   montelukast  (SINGULAIR ) 10 MG tablet Take 1 tablet (10 mg total) by mouth at bedtime. 30 tablet 3   ondansetron  (ZOFRAN ) 8 MG tablet One pill every 8 hours as needed for nausea/vomitting. 40 tablet 1   polyethylene glycol (MIRALAX  / GLYCOLAX ) packet Take 17 g by mouth daily as needed for mild constipation. 14 each 0   potassium chloride  SA (KLOR-CON  M) 20 MEQ tablet Take 3 tablets (60 mEq total) by mouth daily. For low potassium 90 tablet 2   spironolactone  (ALDACTONE ) 25 MG tablet Take 1 tablet by mouth once daily 90 tablet 3   traMADol  (ULTRAM ) 50 MG tablet Take 1 tablet (50 mg total) by mouth every 12 (twelve) hours as needed. 60 tablet 0   Vitamin D , Ergocalciferol , (DRISDOL ) 1.25 MG (50000 UNIT) CAPS capsule Take 1 capsule by mouth  once a week 12 capsule 0   Brinzolamide-Brimonidine  (SIMBRINZA) 1-0.2 % SUSP Apply 1 drop to eye in the morning and at bedtime. I drop both eyes twice a day (Patient not taking: Reported on 10/10/2023)     Latanoprostene Bunod (VYZULTA) 0.024 % SOLN Apply 1 drop to eye at bedtime. 1 drop Left eye every night (Patient not taking: Reported on 10/10/2023)     pantoprazole  (PROTONIX ) 40 MG tablet Take 1 tablet (40 mg total) by mouth daily. 90 tablet 0   No current facility-administered medications for this visit.   Facility-Administered Medications Ordered in Other Visits  Medication Dose Route Frequency Provider Last Rate Last Admin   0.9 %  sodium chloride  infusion   Intravenous Continuous Geary Kells, PA-C        PHYSICAL EXAMINATION:   BP (!) 152/82 (BP Location: Right Arm, Patient Position: Sitting, Cuff Size: Normal)   Pulse 69   Temp 99 F (37.2 C) (Tympanic)   Resp 20   Ht 5\' 2"  (1.575 m)   Wt 156 lb 12.8 oz (  71.1 kg)   SpO2 100%   BMI 28.68 kg/m   Filed Weights   11/14/23 0844  Weight: 156 lb 12.8 oz (71.1 kg)         Mild leg swelling.   Physical Exam HENT:     Head: Normocephalic and atraumatic.  Eyes:     Pupils: Pupils are equal, round, and reactive to light.  Cardiovascular:     Rate and Rhythm: Normal rate and regular rhythm.  Pulmonary:     Effort: Pulmonary effort is normal. No respiratory distress.     Breath sounds: Normal breath sounds. No wheezing.  Abdominal:     General: Bowel sounds are normal. There is no distension.     Palpations: Abdomen is soft. There is no mass.     Tenderness: There is no abdominal tenderness. There is no guarding or rebound.  Musculoskeletal:        General: No tenderness. Normal range of motion.     Cervical back: Normal range of motion and neck supple.  Skin:    General: Skin is warm.  Neurological:     Mental Status: She is alert and oriented to person, place, and time.  Psychiatric:        Mood and Affect:  Affect normal.    LABORATORY DATA:  I have reviewed the data as listed    Component Value Date/Time   NA 142 11/14/2023 0831   NA 141 01/25/2020 1530   K 2.8 (L) 11/14/2023 0831   CL 110 11/14/2023 0831   CO2 22 11/14/2023 0831   GLUCOSE 103 (H) 11/14/2023 0831   BUN 14 11/14/2023 0831   BUN 19 01/25/2020 1530   CREATININE 0.53 11/14/2023 0831   CALCIUM  8.1 (L) 11/14/2023 0831   PROT 6.5 11/14/2023 0831   ALBUMIN 3.9 11/14/2023 0831   AST 16 11/14/2023 0831   ALT 8 11/14/2023 0831   ALKPHOS 78 11/14/2023 0831   BILITOT 0.5 11/14/2023 0831   GFRNONAA >60 11/14/2023 0831   GFRAA NOT CALCULATED 04/17/2020 1352    No results found for: "SPEP", "UPEP"  Lab Results  Component Value Date   WBC 4.9 11/14/2023   NEUTROABS 2.7 11/14/2023   HGB 10.4 (L) 11/14/2023   HCT 33.1 (L) 11/14/2023   MCV 87.6 11/14/2023   PLT 206 11/14/2023      Chemistry      Component Value Date/Time   NA 142 11/14/2023 0831   NA 141 01/25/2020 1530   K 2.8 (L) 11/14/2023 0831   CL 110 11/14/2023 0831   CO2 22 11/14/2023 0831   BUN 14 11/14/2023 0831   BUN 19 01/25/2020 1530   CREATININE 0.53 11/14/2023 0831      Component Value Date/Time   CALCIUM  8.1 (L) 11/14/2023 0831   ALKPHOS 78 11/14/2023 0831   AST 16 11/14/2023 0831   ALT 8 11/14/2023 0831   BILITOT 0.5 11/14/2023 0831      (H): Data is abnormally high   Latest Reference Range & Units Most Recent 12/24/20 14:01 04/01/21 14:34 04/28/21 09:17 06/11/21 09:42 07/03/21 10:32 08/20/21 10:06  M Protein SerPl Elph-Mcnc Not Observed g/dL 1.1 (H) (C) 1/61/09 60:45 Not Observed (C) 0.6 (H) (C) 0.7 (H) (C) 0.5 (H) (C) 0.7 (H) (C) 1.1 (H) (C)    Latest Reference Range & Units Most Recent 09/17/21 08:47 10/15/21 08:25 11/12/21 08:55  Kappa free light chain 3.3 - 19.4 mg/L 5.0 11/12/21 08:55 7.2 7.2 5.0  Lambda free light chains 5.7 -  26.3 mg/L 223.6 (H) 11/12/21 08:55 215.3 (H) 340.2 (H) 223.6 (H)  Kappa, lambda light chain ratio 0.26 -  1.65  0.02 (L) 11/12/21 08:55 0.03 (L) 0.02 (L) 0.02 (L)  (H): Data is abnormally high (L): Data is abnormally low  RADIOGRAPHIC STUDIES: I have personally reviewed the radiological images as listed and agreed with the findings in the report. No results found.   ASSESSMENT & PLAN:  Multiple myeloma not having achieved remission (HCC) # RECURRENT Multiple myeloma stage III [high-risk cytogenetics-status post KRD-VGPR ; status post autologous stem cell transplant on 06/15/18.   MAY 2024- 1.3. ]  currently on Isa-Dex; d-2 carfil weekly q 28 days.  # MARCH 2025- M-protein -0.3 ; kappa lambda light chain ratio-1.8 lambda light chain 63-slowly RISINSG - monitor for now. FEB 2025- Ejection fraction improved.  # Proceed with Isa-Dex-Carfil- Labs-CBC/chemistries were reviewed with the patient.  # DROP in EF:  MARCH 2024-Echo [[facility]] Left ventricular EF 50 to 55%; AUG 13th/OCT 23rd, 2024- EF-45-50%.  FEB 26th, 2025- Left ventricular ejection fraction, by estimation, is 50 to 55%. The left ventricle has low normal function Recommend monitoring for now. Stable.   # Anemia-Hb 9-10- JAn 2025- I ron sat- 8%; ferritin- 19 on prn venofer - Stable.   # Electrolyte: Hypokalemia: mild-  continue compliance Kdur TID-; hypocalcemia- 8.0- vit D SEP 2024- 50- continie Ergo 50k weekly. continue ca BID    sep 2024- Vit D 54. Stable.   # Iatrogenic hypothyroidism [ Graves/goiter s/p total thyroidectomy;aug,2022 ]-currently on Synthroid  150 mcg/day-[increased in AUG, 2024-   Stable.   # Glaucoma BIL- L > R [Dr.King; [facility]] - ? Steroid induced on eye drops. decreased the steroids.  Discussed with ophthalmology- monitor for now.  Stable.  # Lower back discomfort x 6 months-see above.  MRI lumbar spine as above.  Continue tramadol - refilled . stable  # Bone lesions /last zometa  on 11/27/2017.  S/p   Dental extraction PET scan evidence of any bone lesions.- consider Zometa   at next visit.   # Infection  prophylaxis: Acyclovir ; asprin- add IVIG infusion [IgG < 400]- plan  IVIG- q 4 w #1 on- 9/17- stable   # IV access: mediport- functional  # Vaccination: Flu shot today [sep 2024] s/p Pneumonia vaccination [march 2024]- stable.  Recommend RSV vaccination.   6 w- given dental extrac- on 1/26. :MM panel; K/l light chain ratio q 4 W  D-isa-dex;car1&2-car; weekly x 3; week 4-OFF;  q 28 day cycle; NO zometa - dental issues-ok; but hypocalemia  PS:  # DISPOSITION: #  chemo today;  and tomorrow. Today- venofer - # follow up as planned-     # add-  appt for until 5/19-5/20 # add cbc.bmp;weekly; and in 4 weeks- MM panel/k-l light chains- Dr.B   Orders Placed This Encounter  Procedures   CBC with Differential (Cancer Center Only)    Standing Status:   Future    Expected Date:   12/12/2023    Expiration Date:   12/11/2024   CMP (Cancer Center only)    Standing Status:   Future    Expected Date:   12/12/2023    Expiration Date:   12/11/2024   All questions were answered. The patient knows to call the clinic with any problems, questions or concerns.      [name], MD 11/14/2023 9:49 AM End

## 2023-11-15 ENCOUNTER — Inpatient Hospital Stay

## 2023-11-15 ENCOUNTER — Encounter: Payer: Self-pay | Admitting: Internal Medicine

## 2023-11-15 ENCOUNTER — Other Ambulatory Visit: Payer: Self-pay

## 2023-11-15 VITALS — BP 160/76 | HR 57 | Temp 97.6°F | Resp 16

## 2023-11-15 DIAGNOSIS — C9000 Multiple myeloma not having achieved remission: Secondary | ICD-10-CM

## 2023-11-15 DIAGNOSIS — Z5112 Encounter for antineoplastic immunotherapy: Secondary | ICD-10-CM | POA: Diagnosis not present

## 2023-11-15 MED ORDER — HEPARIN SOD (PORK) LOCK FLUSH 100 UNIT/ML IV SOLN
500.0000 [IU] | Freq: Once | INTRAVENOUS | Status: AC | PRN
Start: 2023-11-15 — End: 2023-11-15
  Administered 2023-11-15: 500 [IU]
  Filled 2023-11-15: qty 5

## 2023-11-15 MED ORDER — DEXTROSE 5 % IV SOLN
56.0000 mg/m2 | Freq: Once | INTRAVENOUS | Status: AC
  Administered 2023-11-15: 100 mg via INTRAVENOUS
  Filled 2023-11-15: qty 30

## 2023-11-15 MED ORDER — SODIUM CHLORIDE 0.9 % IV SOLN
Freq: Once | INTRAVENOUS | Status: AC
Start: 2023-11-15 — End: 2023-11-15
  Filled 2023-11-15: qty 250

## 2023-11-15 MED ORDER — DEXAMETHASONE SODIUM PHOSPHATE 10 MG/ML IJ SOLN
5.0000 mg | Freq: Once | INTRAMUSCULAR | Status: AC
  Administered 2023-11-15: 5 mg via INTRAVENOUS
  Filled 2023-11-15: qty 1

## 2023-11-15 MED ORDER — SODIUM CHLORIDE 0.9 % IV SOLN
Freq: Once | INTRAVENOUS | Status: AC
  Filled 2023-11-15: qty 250

## 2023-11-15 NOTE — Patient Instructions (Signed)
 [facility] - A DEPT OF [facility]  Discharge Instructions: Thank you for choosing [facility] to provide your oncology and hematology care.  If you have a lab appointment with the Cancer Center, please go directly to the Cancer Center and check in at the registration area.  Wear comfortable clothing and clothing appropriate for easy access to any Portacath or PICC line.   We strive to give you quality time with your provider. You may need to reschedule your appointment if you arrive late (15 or more minutes).  Arriving late affects you and other patients whose appointments are after yours.  Also, if you miss three or more appointments without notifying the office, you may be dismissed from the clinic at the provider's discretion.      For prescription refill requests, have your pharmacy contact our office and allow 72 hours for refills to be completed.    Today you received the following chemotherapy and/or immunotherapy agents Kyprolis      To help prevent nausea and vomiting after your treatment, we encourage you to take your nausea medication as directed.  BELOW ARE SYMPTOMS THAT SHOULD BE REPORTED IMMEDIATELY: *FEVER GREATER THAN 100.4 F (38 C) OR HIGHER *CHILLS OR SWEATING *NAUSEA AND VOMITING THAT IS NOT CONTROLLED WITH YOUR NAUSEA MEDICATION *UNUSUAL SHORTNESS OF BREATH *UNUSUAL BRUISING OR BLEEDING *URINARY PROBLEMS (pain or burning when urinating, or frequent urination) *BOWEL PROBLEMS (unusual diarrhea, constipation, pain near the anus) TENDERNESS IN MOUTH AND THROAT WITH OR WITHOUT PRESENCE OF ULCERS (sore throat, sores in mouth, or a toothache) UNUSUAL RASH, SWELLING OR PAIN  UNUSUAL VAGINAL DISCHARGE OR ITCHING   Items with * indicate a potential emergency and should be followed up as soon as possible or go to the Emergency Department if any problems should occur.  Please show the CHEMOTHERAPY ALERT CARD or IMMUNOTHERAPY  ALERT CARD at check-in to the Emergency Department and triage nurse.  Should you have questions after your visit or need to cancel or reschedule your appointment, please contact [facility] - A DEPT OF [facility]  [phone] and follow the prompts.  Office hours are 8:00 a.m. to 4:30 p.m. Monday - Friday. Please note that voicemails left after 4:00 p.m. may not be returned until the following business day.  We are closed weekends and major holidays. You have access to a nurse at all times for urgent questions. Please call the main number to the clinic [phone] and follow the prompts.  For any non-urgent questions, you may also contact your provider using MyChart. We now offer e-Visits for anyone 36 and older to request care online for non-urgent symptoms. For details visit [email or web address].   Also download the MyChart app! Go to the app store, search "MyChart", open the app, select , and log in with your MyChart username and password.

## 2023-11-16 ENCOUNTER — Encounter: Payer: Self-pay | Admitting: Internal Medicine

## 2023-11-22 ENCOUNTER — Inpatient Hospital Stay (HOSPITAL_BASED_OUTPATIENT_CLINIC_OR_DEPARTMENT_OTHER): Admitting: Internal Medicine

## 2023-11-22 ENCOUNTER — Inpatient Hospital Stay

## 2023-11-22 ENCOUNTER — Encounter: Payer: Self-pay | Admitting: Internal Medicine

## 2023-11-22 VITALS — BP 154/81 | HR 61 | Temp 98.4°F | Resp 20 | Ht 62.0 in | Wt 157.3 lb

## 2023-11-22 DIAGNOSIS — C9000 Multiple myeloma not having achieved remission: Secondary | ICD-10-CM | POA: Diagnosis not present

## 2023-11-22 DIAGNOSIS — Z5112 Encounter for antineoplastic immunotherapy: Secondary | ICD-10-CM | POA: Diagnosis not present

## 2023-11-22 DIAGNOSIS — E876 Hypokalemia: Secondary | ICD-10-CM

## 2023-11-22 LAB — CMP (CANCER CENTER ONLY)
ALT: 8 U/L (ref 0–44)
AST: 13 U/L — ABNORMAL LOW (ref 15–41)
Albumin: 3.8 g/dL (ref 3.5–5.0)
Alkaline Phosphatase: 71 U/L (ref 38–126)
Anion gap: 8 (ref 5–15)
BUN: 13 mg/dL (ref 8–23)
CO2: 21 mmol/L — ABNORMAL LOW (ref 22–32)
Calcium: 8 mg/dL — ABNORMAL LOW (ref 8.9–10.3)
Chloride: 111 mmol/L (ref 98–111)
Creatinine: 0.61 mg/dL (ref 0.44–1.00)
GFR, Estimated: >60 mL/min (ref >60)
Glucose, Bld: 110 mg/dL — ABNORMAL HIGH (ref 70–99)
Potassium: 3.1 mmol/L — ABNORMAL LOW (ref 3.5–5.1)
Sodium: 140 mmol/L (ref 135–145)
Total Bilirubin: 0.5 mg/dL (ref 0.0–1.2)
Total Protein: 6.4 g/dL — ABNORMAL LOW (ref 6.5–8.1)

## 2023-11-22 LAB — CBC WITH DIFFERENTIAL (CANCER CENTER ONLY)
Abs Immature Granulocytes: 0.01 10*3/uL (ref 0.00–0.07)
Basophils Absolute: 0 10*3/uL (ref 0.0–0.1)
Basophils Relative: 0 %
Eosinophils Absolute: 0.1 10*3/uL (ref 0.0–0.5)
Eosinophils Relative: 3 %
HCT: 33.7 % — ABNORMAL LOW (ref 36.0–46.0)
Hemoglobin: 10.7 g/dL — ABNORMAL LOW (ref 12.0–15.0)
Immature Granulocytes: 0 %
Lymphocytes Relative: 32 %
Lymphs Abs: 1.4 10*3/uL (ref 0.7–4.0)
MCH: 27.5 pg (ref 26.0–34.0)
MCHC: 31.8 g/dL (ref 30.0–36.0)
MCV: 86.6 fL (ref 80.0–100.0)
Monocytes Absolute: 0.5 10*3/uL (ref 0.1–1.0)
Monocytes Relative: 11 %
Neutro Abs: 2.4 10*3/uL (ref 1.7–7.7)
Neutrophils Relative %: 54 %
Platelet Count: 110 10*3/uL — ABNORMAL LOW (ref 150–400)
RBC: 3.89 MIL/uL (ref 3.87–5.11)
RDW: 16.6 % — ABNORMAL HIGH (ref 11.5–15.5)
WBC Count: 4.5 10*3/uL (ref 4.0–10.5)
nRBC: 0 % (ref 0.0–0.2)

## 2023-11-22 MED ORDER — DEXTROSE 5 % IV SOLN
56.0000 mg/m2 | Freq: Once | INTRAVENOUS | Status: AC
  Administered 2023-11-22: 100 mg via INTRAVENOUS
  Filled 2023-11-22: qty 30

## 2023-11-22 MED ORDER — SODIUM CHLORIDE 0.9 % IV SOLN
Freq: Once | INTRAVENOUS | Status: AC
  Filled 2023-11-22: qty 250

## 2023-11-22 MED ORDER — TRAMADOL HCL 50 MG PO TABS
50.0000 mg | ORAL_TABLET | Freq: Two times a day (BID) | ORAL | 0 refills | Status: DC | PRN
Start: 2023-11-22 — End: 2023-12-15

## 2023-11-22 MED ORDER — LEVOTHYROXINE SODIUM 175 MCG PO TABS
175.0000 ug | ORAL_TABLET | Freq: Every day | ORAL | 1 refills | Status: DC
Start: 2023-11-22 — End: 2024-05-01

## 2023-11-22 MED ORDER — IRON SUCROSE 20 MG/ML IV SOLN
200.0000 mg | Freq: Once | INTRAVENOUS | Status: AC
  Administered 2023-11-22: 200 mg via INTRAVENOUS

## 2023-11-22 MED ORDER — DEXAMETHASONE SODIUM PHOSPHATE 10 MG/ML IJ SOLN
5.0000 mg | Freq: Once | INTRAMUSCULAR | Status: AC
  Administered 2023-11-22: 5 mg via INTRAVENOUS
  Filled 2023-11-22: qty 1

## 2023-11-22 MED ORDER — HEPARIN SOD (PORK) LOCK FLUSH 100 UNIT/ML IV SOLN
500.0000 [IU] | Freq: Once | INTRAVENOUS | Status: AC | PRN
  Administered 2023-11-22: 500 [IU]
  Filled 2023-11-22: qty 5

## 2023-11-22 NOTE — Patient Instructions (Signed)
 [facility] - A DEPT OF [facility]  Discharge Instructions: Thank you for choosing [facility] to provide your oncology and hematology care.  If you have a lab appointment with the Cancer Center, please go directly to the Cancer Center and check in at the registration area.  Wear comfortable clothing and clothing appropriate for easy access to any Portacath or PICC line.   We strive to give you quality time with your provider. You may need to reschedule your appointment if you arrive late (15 or more minutes).  Arriving late affects you and other patients whose appointments are after yours.  Also, if you miss three or more appointments without notifying the office, you may be dismissed from the clinic at the provider's discretion.      For prescription refill requests, have your pharmacy contact our office and allow 72 hours for refills to be completed.    Today you received the following chemotherapy and/or immunotherapy agents VENOFER  and KYPROLIS       To help prevent nausea and vomiting after your treatment, we encourage you to take your nausea medication as directed.  BELOW ARE SYMPTOMS THAT SHOULD BE REPORTED IMMEDIATELY: *FEVER GREATER THAN 100.4 F (38 C) OR HIGHER *CHILLS OR SWEATING *NAUSEA AND VOMITING THAT IS NOT CONTROLLED WITH YOUR NAUSEA MEDICATION *UNUSUAL SHORTNESS OF BREATH *UNUSUAL BRUISING OR BLEEDING *URINARY PROBLEMS (pain or burning when urinating, or frequent urination) *BOWEL PROBLEMS (unusual diarrhea, constipation, pain near the anus) TENDERNESS IN MOUTH AND THROAT WITH OR WITHOUT PRESENCE OF ULCERS (sore throat, sores in mouth, or a toothache) UNUSUAL RASH, SWELLING OR PAIN  UNUSUAL VAGINAL DISCHARGE OR ITCHING   Items with * indicate a potential emergency and should be followed up as soon as possible or go to the Emergency Department if any problems should occur.  Please show the CHEMOTHERAPY ALERT CARD or  IMMUNOTHERAPY ALERT CARD at check-in to the Emergency Department and triage nurse.  Should you have questions after your visit or need to cancel or reschedule your appointment, please contact [facility] - A DEPT OF [facility]  [phone] and follow the prompts.  Office hours are 8:00 a.m. to 4:30 p.m. Monday - Friday. Please note that voicemails left after 4:00 p.m. may not be returned until the following business day.  We are closed weekends and major holidays. You have access to a nurse at all times for urgent questions. Please call the main number to the clinic [phone] and follow the prompts.  For any non-urgent questions, you may also contact your provider using MyChart. We now offer e-Visits for anyone 41 and older to request care online for non-urgent symptoms. For details visit [email or web address].   Also download the MyChart app! Go to the app store, search "MyChart", open the app, select [facility], and log in with your MyChart username and password.  Iron  Sucrose Injection What is this medication? IRON  SUCROSE (EYE ern SOO krose) treats low levels of iron  (iron  deficiency anemia) in people with kidney disease. Iron  is a mineral that plays an important role in making red blood cells, which carry oxygen from your lungs to the rest of your body. This medicine may be used for other purposes; ask your health care provider or pharmacist if you have questions. COMMON BRAND NAME(S): Venofer  What should I tell my care team before I take this medication? They need to know if you have any of  these conditions: Anemia not caused by low iron  levels Heart disease High levels of iron  in the blood Kidney disease Liver disease An unusual or allergic reaction to iron , other medications, foods, dyes, or preservatives Pregnant or trying to get pregnant Breastfeeding How should I use this medication? This medication is infused into a vein. It is given by  your care team in a hospital or clinic setting. Talk to your care team about the use of this medication in children. While it may be prescribed for children as young as 2 years for selected conditions, precautions do apply. Overdosage: If you think you have taken too much of this medicine contact a poison control center or emergency room at once. NOTE: This medicine is only for you. Do not share this medicine with others. What if I miss a dose? Keep appointments for follow-up doses. It is important not to miss your dose. Call your care team if you are unable to keep an appointment. What may interact with this medication? Do not take this medication with any of the following: Deferoxamine Dimercaprol  Other iron  products This medication may also interact with the following: Chloramphenicol Deferasirox This list may not describe all possible interactions. Give your health care provider a list of all the medicines, herbs, non-prescription drugs, or dietary supplements you use. Also tell them if you smoke, drink alcohol, or use illegal drugs. Some items may interact with your medicine. What should I watch for while using this medication? Visit your care team for regular checks on your progress. Tell your care team if your symptoms do not start to get better or if they get worse. You may need blood work done while you are taking this medication. You may need to eat more foods that contain iron . Talk to your care team. Foods that contain iron  include whole grains or cereals, dried fruits, beans, peas, leafy green vegetables, and organ meats (liver, kidney). What side effects may I notice from receiving this medication? Side effects that you should report to your care team as soon as possible: Allergic reactions--skin rash, itching, hives, swelling of the face, lips, tongue, or throat Low blood pressure--dizziness, feeling faint or lightheaded, blurry vision Shortness of breath Side effects that  usually do not require medical attention (report to your care team if they continue or are bothersome): Flushing Headache Joint pain Muscle pain Nausea Pain, redness, or irritation at injection site This list may not describe all possible side effects. Call your doctor for medical advice about side effects. You may report side effects to FDA at 1-800-FDA-1088. Where should I keep my medication? This medication is given in a hospital or clinic. It will not be stored at home. NOTE: This sheet is a summary. It may not cover all possible information. If you have questions about this medicine, talk to your doctor, pharmacist, or health care provider.  2024 Elsevier/Gold Standard (2023-03-02 00:00:00)  Carfilzomib  Injection What is this medication? CARFILZOMIB  (kar FILZ oh mib) treats multiple myeloma, a type of bone marrow cancer. It works by blocking a protein that causes cancer cells to grow and multiply. This helps to slow or stop the spread of cancer cells. This medicine may be used for other purposes; ask your health care provider or pharmacist if you have questions. COMMON BRAND NAME(S): KYPROLIS  What should I tell my care team before I take this medication? They need to know if you have any of these conditions: Heart disease History of blood clots Irregular heartbeat Kidney disease Liver  disease Lung or breathing disease An unusual or allergic reaction to carfilzomib , or other medications, foods, dyes, or preservatives If you or your partner are pregnant or trying to get pregnant Breastfeeding How should I use this medication? This medication is injected into a vein. It is given by your care team in a hospital or clinic setting. Talk to your care team about the use of this medication in children. Special care may be needed. Overdosage: If you think you have taken too much of this medicine contact a poison control center or emergency room at once. NOTE: This medicine is only for  you. Do not share this medicine with others. What if I miss a dose? Keep appointments for follow-up doses. It is important not to miss your dose. Call your care team if you are unable to keep an appointment. What may interact with this medication? Interactions are not expected. This list may not describe all possible interactions. Give your health care provider a list of all the medicines, herbs, non-prescription drugs, or dietary supplements you use. Also tell them if you smoke, drink alcohol, or use illegal drugs. Some items may interact with your medicine. What should I watch for while using this medication? Your condition will be monitored carefully while you are receiving this medication. You may need blood work while taking this medication. Check with your care team if you have severe diarrhea, nausea, and vomiting, or if you sweat a lot. The loss of too much body fluid may make it dangerous for you to take this medication. This medication may affect your coordination, reaction time, or judgment. Do not drive or operate machinery until you know how this medication affects you. Sit up or stand slowly to reduce the risk of dizzy or fainting spells. Drinking alcohol with this medication can increase the risk of these side effects. Talk to your care team if you may be pregnant. Serious birth defects can occur if you take this medication during pregnancy and for 6 months after the last dose. You will need a negative pregnancy test before starting this medication. Contraception is recommended while taking this medication and for 6 months after the last dose. Your care team can help you find an option that works for you. If your partner can get pregnant, use a condom during sex while taking this medication and for 3 months after the last dose. Do not breastfeed while taking this medication and for 2 weeks after the last dose. This medication may cause infertility. Talk to your care team if you are  concerned about your fertility. What side effects may I notice from receiving this medication? Side effects that you should report to your care team as soon as possible: Allergic reactions--skin rash, itching, hives, swelling of the face, lips, tongue, or throat Bleeding--bloody or black, tar-like stools, vomiting blood or brown material that looks like coffee grounds, red or dark brown urine, small red or purple spots on skin, unusual bruising or bleeding Blood clot--pain, swelling, or warmth in the leg, shortness of breath, chest pain Dizziness, loss of balance or coordination, confusion or trouble speaking Heart attack--pain or tightness in the chest, shoulders, arms, or jaw, nausea, shortness of breath, cold or clammy skin, feeling faint or lightheaded Heart failure--shortness of breath, swelling of the ankles, feet, or hands, sudden weight gain, unusual weakness or fatigue Heart rhythm changes--fast or irregular heartbeat, dizziness, feeling faint or lightheaded, chest pain, trouble breathing Increase in blood pressure Infection--fever, chills, cough, sore throat, wounds that  don't heal, pain or trouble when passing urine, general feeling of discomfort or being unwell Infusion reactions--chest pain, shortness of breath or trouble breathing, feeling faint or lightheaded Kidney injury--decrease in the amount of urine, swelling of the ankles, hands, or feet Liver injury--right upper belly pain, loss of appetite, nausea, light-colored stool, dark yellow or brown urine, yellowing skin or eyes, unusual weakness or fatigue Lung injury--shortness of breath or trouble breathing, cough, spitting up blood, chest pain, fever Pulmonary hypertension--shortness of breath, chest pain, fast or irregular heartbeat, feeling faint or lightheaded, fatigue, swelling of the ankles or feet Stomach pain, bloody diarrhea, pale skin, unusual weakness or fatigue, decrease in the amount of urine, which may be signs of  hemolytic uremic syndrome Sudden and severe headache, confusion, change in vision, seizures, which may be signs of posterior reversible encephalopathy syndrome (PRES) TTP--purple spots on the skin or inside the mouth, pale skin, yellowing skin or eyes, unusual weakness or fatigue, fever, fast or irregular heartbeat, confusion, change in vision, trouble speaking, trouble walking Tumor lysis syndrome (TLS)--nausea, vomiting, diarrhea, decrease in the amount of urine, dark urine, unusual weakness or fatigue, confusion, muscle pain or cramps, fast or irregular heartbeat, joint pain Side effects that usually do not require medical attention (report to your care team if they continue or are bothersome): Diarrhea Fatigue Nausea Trouble sleeping This list may not describe all possible side effects. Call your doctor for medical advice about side effects. You may report side effects to FDA at 1-800-FDA-1088. Where should I keep my medication? This medication is given in a hospital or clinic. It will not be stored at home. NOTE: This sheet is a summary. It may not cover all possible information. If you have questions about this medicine, talk to your doctor, pharmacist, or health care provider.  2024 Elsevier/Gold Standard (2021-12-10 00:00:00)

## 2023-11-22 NOTE — Progress Notes (Signed)
 Refill tramadol  and levothyroxine , pended.  C/o shoulder pain/arm at times since she had a pneumonia injection last year.

## 2023-11-22 NOTE — Progress Notes (Signed)
 [facility] OFFICE PROGRESS NOTE  Patient Care Team: [name], MD as PCP - General (Family Medicine) End, [name], MD as PCP - Cardiology (Cardiology) [name], [name] [name], MD as Consulting Physician (Hematology and Oncology) [name], MD as Consulting Physician (Pulmonary Disease) [name], MD as Consulting Physician (Endocrinology)   Cancer Staging  Multiple myeloma not having achieved remission [facility] Staging form: Plasma Cell Myeloma and Plasma Cell Disorders, AJCC 8th Edition - Clinical: No stage assigned - Unsigned - Clinical: No stage assigned - Unsigned   Oncology History Overview Note  # SEP 2018- MULTIPLE MYELOMA IgALamda [2.5 gm/dl; K/L= 16/1096]; STAGE III [beta 2 microglobulin=5.5] [presented with acute renal failure; anemia; NO hypercalcemia; Skeletal survey-Normal]; BMBx- 45% plasma cells; FISH-POSITIVE 11:14 translocation.[STANDARD-high RISK]/cyto-Normal; SEP 2018- PET- L3 posterior element lesion.   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 No morphologic support for residual myeloma disease. Negative for minimal residual disease by MM-MRD flow cytometry; HOLD REVLIMID  [leg swelling]; AUG 2021-PET scan negative for myeloma; continue to hold Revlimid   # MARCH 2021-diastolic congestive heart failure [Dr.End]  # BMBx- OCT 2021- BMBx- 5% plasmacytosis-however this appears to be  more polyclonal rather than monoclonal-not explain patient worsening anemia.   # OCT 2022- 18th- Dara- Rev-Dex; MARCH, 2024- PROGRESSION bone marrow biopsy 70% plasma cells; PET scan no bone lesions-however conus medullaris uptake-MRI lumbar spine-   # MAY 2024- DISCONTINUE carfilzomib -dexamethasone - Pomalyst ; [rising M-protein; MAY 2024- 1.3. ]   # start 12/27/2022 -Isa-Carfil-Dex;  [decrease dexamethasone  20 weekly- starting 05/04/23- sec to glaucoma].   # OCT 24th, 2024- Bone marrow Biopsy: The findings are consistent with  focal minimal residual lambda restricted plasma cell neoplasm- HOLD treatment sec to glaucoma; and drop in EF AUG 2024- 45-50% [March 2024- 50-55%]  # MARCH 16th, 2025- re-started Isa-Carfil-Dex;  #November 2021-hyperthyroidism/goiter- [D.Bennett/Dr.Solum]-methimazole . S/p Thyroidectomy [Dr.Kim; [facility]-AUG 2022]  --------------------------  # 12/12- RIGHT JUGULAR DVT-x 13m on xarelto ; finished April 2020; September 2020-EGD/dysphagia; Dr. Antony Baumgartner  # Acute renal failure [Dr.Singh; Proteinuria 1.5gm/day ]; acyclovir Lavell Portugal ------------------------------------------------------------------------------------------------------------   DIAGNOSIS: [ ]  MULTIPLE MYELOMA  STAGE: III/HIGH RISK ;GOALS: CONTROL      Multiple myeloma not having achieved remission (HCC)  11/21/2017 - 04/28/2018 Chemotherapy   Patient is on Treatment Plan : MYELOMA SALVAGE Cyclophosphamide  / Carfilzomib  / Dexamethasone  (CCd) q28d     05/12/2021 - 02/25/2022 Chemotherapy   Patient is on Treatment Plan : MYELOMA RELAPSED REFRACTORY Daratumumab  SQ + Lenalidomide  + Dexamethasone  (DaraRd) q28d     05/12/2021 - 07/23/2022 Chemotherapy   Patient is on Treatment Plan : MYELOMA RELAPSED REFRACTORY Daratumumab  SQ + Lenalidomide  + Dexamethasone  (DaraRd) q28d     11/02/2022 - 11/16/2022 Chemotherapy   Patient is on Treatment Plan : MYELOMA RELAPSED/ REFRACTORY Carfilzomib  D1,8,15 (20/27) + Pomalidomide  +  Dexamethasone  (KPd) q28d     12/27/2022 -  Chemotherapy   Patient is on Treatment Plan : MYELOMA Carfilzomib  + Dexamethasone  + Isatuximab  q28d      INTERVAL HISTORY: Ambulating  independently.  Alone.  Brandi [name] 73 y.o.  female pleasant patient above history of relapsed multiple myeloma--most recently on  Isa- Carfil-Dex  is here for a follow up.  No fevers or chills. No infections. Denies any worsening pain.  Patient taking tramadol  as needed for upper and leg cramps. Mild tingling in feet.    Review of Systems  Constitutional:  Positive for malaise/fatigue. Negative for chills, diaphoresis and fever.  HENT:  Negative for nosebleeds and sore throat.   Eyes:  Negative for double vision.  Respiratory:  Negative for hemoptysis.   Cardiovascular:  Negative for chest pain, palpitations and orthopnea.  Gastrointestinal:  Negative for abdominal pain, blood in stool, constipation, heartburn and melena.  Genitourinary:  Negative for dysuria, frequency and urgency.  Musculoskeletal:  Positive for back pain.  Skin: Negative.  Negative for itching and rash.  Neurological:  Negative for dizziness, tingling, focal weakness, weakness and headaches.  Endo/Heme/Allergies:  Does not bruise/bleed easily.  Psychiatric/Behavioral:  Negative for depression. The patient is not nervous/anxious.     PAST MEDICAL HISTORY :  Past Medical History:  Diagnosis Date   (HFpEF) heart failure with preserved ejection fraction (HCC)    a. 06/2020 Echo: EF 60-65%, no rwma, nl RV size/fxn. Triv MR. Triv TR/AI. Mod elev PASP.   Anxiety    COPD (chronic obstructive pulmonary disease) (HCC)    GERD (gastroesophageal reflux disease)    Hypertension    Hyperthyroidism    Hypokalemia    IgA myeloma (HCC)    Multiple myeloma (HCC)    Pneumonia    Red blood cell antibody positive, compatible PRBC difficult to obtain    S/P autologous bone marrow transplantation (HCC)     PAST SURGICAL HISTORY :   Past  Surgical History:  Procedure Laterality Date   ANTERIOR VITRECTOMY Left 10/07/2021   Procedure: ANTERIOR VITRECTOMY;  Surgeon: Annell Kidney, MD;  Location: [facility];  Service: Ophthalmology;  Laterality: Left;   CATARACT EXTRACTION W/PHACO Left 10/07/2021   Procedure: CATARACT EXTRACTION PHACO AND INTRAOCULAR LENS PLACEMENT (IOC) LEFT VISION BLUE;  Surgeon: Annell Kidney, MD;  Location: [facility];  Service: Ophthalmology;  Laterality: Left;  kit for manual incision 14.56 01:21.4   CATARACT EXTRACTION W/PHACO Right 10/21/2021   Procedure: CATARACT EXTRACTION PHACO AND INTRAOCULAR LENS PLACEMENT (IOC) RIGHT 11.12 01:48.1;  Surgeon: Annell Kidney, MD;  Location: [facility];  Service: Ophthalmology;  Laterality: Right;   ESOPHAGOGASTRODUODENOSCOPY (EGD) WITH PROPOFOL  N/A 03/30/2019   Procedure: ESOPHAGOGASTRODUODENOSCOPY (EGD) WITH PROPOFOL ;  Surgeon: Luke Salaam, MD;  Location: [facility];  Service: Gastroenterology;  Laterality: N/A;   IR BONE MARROW BIOPSY & ASPIRATION  05/25/2023   IR FLUORO GUIDE PORT INSERTION RIGHT  12/16/2017   IR IMAGING GUIDED PORT INSERTION  06/15/2021   KNEE ARTHROSCOPY Left 09/03/2022   Procedure: ARTHROSCOPY KNEE DEBRIDEMENT;  Surgeon: Jerlyn Moons, MD;  Location: [facility] ORS;  Service: Orthopedics;  Laterality: Left;   THYROIDECTOMY N/A     FAMILY HISTORY :   Family History  Problem Relation Age of Onset   Pneumonia Mother    Seizures Father     SOCIAL HISTORY:   Social History   Tobacco Use   Smoking status: Former    Current packs/day: 0.00    Types: Cigarettes    Quit date: 03/18/2021    Years since quitting: 2.6   Smokeless tobacco: Never   Tobacco comments:    2 cigarettes QOD  Vaping Use   Vaping status: Never Used  Substance Use Topics   Alcohol use: No   Drug use: No    ALLERGIES:  has no known allergies.  MEDICATIONS:  Current Outpatient Medications  Medication Sig Dispense Refill    acyclovir  (ZOVIRAX ) 400 MG tablet Take 1 tablet (400 mg total) by mouth 2 (two) times daily. 60 tablet 6   albuterol  (VENTOLIN  HFA) 108 (90 Base) MCG/ACT inhaler Inhale 2 puffs into the lungs every 6 (six) hours as needed for wheezing or shortness of breath. 8 g 3   aspirin  EC 81 MG EC tablet Take 1 tablet (81 mg total) by mouth daily. 90 tablet 3   brimonidine  (ALPHAGAN  P) 0.1 % SOLN Place 1 drop into both eyes 2 (two) times daily.     Brinzolamide-Brimonidine  (SIMBRINZA) 1-0.2 % SUSP Apply 1 drop to eye in the morning and at bedtime. I drop both eyes twice a day     calcium  carbonate (CALCIUM  600 HIGH POTENCY) 600 MG TABS tablet Take 2 tablets (1,200 mg total) by mouth 2 (two) times daily. 120 tablet 2   carvedilol  (COREG ) 3.125 MG tablet Take 1 tablet (3.125 mg total) by mouth 2 (two) times daily with a meal. 60 tablet 5   furosemide  (LASIX ) 40 MG tablet Take 1 tablet (40 mg total) by mouth daily. 90 tablet 3   lidocaine -prilocaine  (EMLA ) cream Apply 1 application topically as needed. Apply to port and cover with saran wrap 1-2 hours prior to port access 30 g 1   magnesium  oxide (MAG-OX) 400 (240 Mg) MG tablet Take 1 tablet (400 mg total) by mouth 2 (two) times daily. 60 tablet 3   montelukast  (SINGULAIR ) 10 MG tablet Take 1 tablet (10 mg total) by mouth at bedtime. 30 tablet 3   ondansetron  (ZOFRAN ) 8 MG tablet One pill every 8 hours as needed for nausea/vomitting. 40 tablet 1   polyethylene glycol (MIRALAX  / GLYCOLAX ) packet Take 17 g by mouth daily as needed for mild constipation. 14 each 0   potassium chloride  SA (KLOR-CON  M) 20 MEQ tablet Take 3 tablets (60 mEq total) by mouth daily. For low potassium 90 tablet 2   spironolactone  (ALDACTONE ) 25 MG tablet Take 1 tablet by mouth once daily 90 tablet 3   Vitamin D , Ergocalciferol , (DRISDOL ) 1.25 MG (50000 UNIT) CAPS capsule Take 1 capsule by mouth once a week 12 capsule 0   Latanoprostene Bunod (VYZULTA) 0.024 % SOLN Apply 1 drop to eye at  bedtime. 1 drop Left eye every night (Patient not taking: Reported on 11/22/2023)     levothyroxine  (SYNTHROID ) 175 MCG tablet Take 1 tablet (175 mcg total) by mouth daily. For thyroid  90 tablet 1   pantoprazole  (PROTONIX ) 40 MG tablet Take 1 tablet (40 mg total) by mouth daily. 90 tablet 0   traMADol  (ULTRAM ) 50 MG tablet Take 1 tablet (50 mg total) by mouth every 12 (twelve) hours as needed. 60 tablet 0   No current facility-administered medications for this visit.   Facility-Administered Medications Ordered in Other Visits  Medication Dose Route Frequency Provider Last Rate Last Admin   0.9 %  sodium chloride  infusion   Intravenous Continuous Geary Kells, PA-C       0.9 %  sodium chloride  infusion   Intravenous Once Hurbert Duran R, MD       carfilzomib  (KYPROLIS ) 100 mg in dextrose  5 % 100 mL chemo infusion  56 mg/m2 (Treatment Plan Recorded) Intravenous Once Jaeveon Ashland R, MD       dexamethasone  (DECADRON ) injection 5  mg  5 mg Intravenous Once Samaad Hashem R, MD       heparin  lock flush 100 unit/mL  500 Units Intracatheter Once PRN Candela Krul R, MD        PHYSICAL EXAMINATION:   BP (!) 154/81 (BP Location: Left Arm, Patient Position: Sitting, Cuff Size: Normal)   Pulse 61   Temp 98.4 F (36.9 C) (Tympanic)   Resp 20   Ht 5\' 2"  (1.575 m)   Wt 157 lb 4.8 oz (71.4 kg)   SpO2 100%   BMI 28.77 kg/m   Filed Weights   11/22/23 0908  Weight: 157 lb 4.8 oz (71.4 kg)         Mild leg swelling.   Physical Exam HENT:     Head: Normocephalic and atraumatic.  Eyes:     Pupils: Pupils are equal, round, and reactive to light.  Cardiovascular:     Rate and Rhythm: Normal rate and regular rhythm.  Pulmonary:     Effort: Pulmonary effort is normal. No respiratory distress.     Breath sounds: Normal breath sounds. No wheezing.  Abdominal:     General: Bowel sounds are normal. There is no distension.     Palpations: Abdomen is soft. There is no  mass.     Tenderness: There is no abdominal tenderness. There is no guarding or rebound.  Musculoskeletal:        General: No tenderness. Normal range of motion.     Cervical back: Normal range of motion and neck supple.  Skin:    General: Skin is warm.  Neurological:     Mental Status: She is alert and oriented to person, place, and time.  Psychiatric:        Mood and Affect: Affect normal.    LABORATORY DATA:  I have reviewed the data as listed    Component Value Date/Time   NA 140 11/22/2023 0910   NA 141 01/25/2020 1530   K 3.1 (L) 11/22/2023 0910   CL 111 11/22/2023 0910   CO2 21 (L) 11/22/2023 0910   GLUCOSE 110 (H) 11/22/2023 0910   BUN 13 11/22/2023 0910   BUN 19 01/25/2020 1530   CREATININE 0.61 11/22/2023 0910   CALCIUM  8.0 (L) 11/22/2023 0910   PROT 6.4 (L) 11/22/2023 0910   ALBUMIN 3.8 11/22/2023 0910   AST 13 (L) 11/22/2023 0910   ALT 8 11/22/2023 0910   ALKPHOS 71 11/22/2023 0910   BILITOT 0.5 11/22/2023 0910   GFRNONAA >60 11/22/2023 0910   GFRAA NOT CALCULATED 04/17/2020 1352    No results found for: "SPEP", "UPEP"  Lab Results  Component Value Date   WBC 4.5 11/22/2023   NEUTROABS 2.4 11/22/2023   HGB 10.7 (L) 11/22/2023   HCT 33.7 (L) 11/22/2023   MCV 86.6 11/22/2023   PLT 110 (L) 11/22/2023      Chemistry      Component Value Date/Time   NA 140 11/22/2023 0910   NA 141 01/25/2020 1530   K 3.1 (L) 11/22/2023 0910   CL 111 11/22/2023 0910   CO2 21 (L) 11/22/2023 0910   BUN 13 11/22/2023 0910   BUN 19 01/25/2020 1530   CREATININE 0.61 11/22/2023 0910      Component Value Date/Time   CALCIUM  8.0 (L) 11/22/2023 0910   ALKPHOS 71 11/22/2023 0910   AST 13 (L) 11/22/2023 0910   ALT 8 11/22/2023 0910   BILITOT 0.5 11/22/2023 0910      (H): Data is abnormally  high   Latest Reference Range & Units Most Recent 12/24/20 14:01 04/01/21 14:34 04/28/21 09:17 06/11/21 09:42 07/03/21 10:32 08/20/21 10:06  M Protein SerPl Elph-Mcnc Not Observed  g/dL 1.1 (H) (C) 4/78/29 56:21 Not Observed (C) 0.6 (H) (C) 0.7 (H) (C) 0.5 (H) (C) 0.7 (H) (C) 1.1 (H) (C)    Latest Reference Range & Units Most Recent 09/17/21 08:47 10/15/21 08:25 11/12/21 08:55  Kappa free light chain 3.3 - 19.4 mg/L 5.0 11/12/21 08:55 7.2 7.2 5.0  Lambda free light chains 5.7 - 26.3 mg/L 223.6 (H) 11/12/21 08:55 215.3 (H) 340.2 (H) 223.6 (H)  Kappa, lambda light chain ratio 0.26 - 1.65  0.02 (L) 11/12/21 08:55 0.03 (L) 0.02 (L) 0.02 (L)  (H): Data is abnormally high (L): Data is abnormally low  RADIOGRAPHIC STUDIES: I have personally reviewed the radiological images as listed and agreed with the findings in the report. No results found.   ASSESSMENT & PLAN:  Multiple myeloma not having achieved remission (HCC) # RECURRENT Multiple myeloma stage III [high-risk cytogenetics-status post KRD-VGPR ; status post autologous stem cell transplant on 06/15/18.   MAY 2024- 1.3. ]  currently on Isa-Dex; d-2 carfil weekly q 28 days.  # MARCH 2025- M-protein -0.3 ; kappa lambda light chain ratio-1.8 lambda light chain 63-slowly RISINSG - monitor for now. FEB 2025- Ejection fraction improved. Awaiting re-evaluation at [facility] in MAY 2025.   # Proceed with Isa-Dex-Carfil- Labs-CBC/chemistries were reviewed with the patient.  # DROP in EF:  MARCH 2024-Echo [[facility]] Left ventricular EF 50 to 55%; AUG 13th/OCT 23rd, 2024- EF-45-50%.  FEB 26th, 2025- Left ventricular ejection fraction, by estimation, is 50 to 55%. The left ventricle has low normal function Recommend monitoring for now. Stable.   # Anemia-Hb 9-10- JAn 2025- I ron sat- 8%; ferritin- 19 on prn venofer - Stable.   # Electrolyte: Hypokalemia: mild-  continue compliance Kdur TID-; hypocalcemia- 8.0- vit D SEP 2024- 50- continie Ergo 50k weekly. continue ca BID    sep 2024- Vit D 54. Stable.   # Iatrogenic hypothyroidism [ Graves/goiter s/p total thyroidectomy;aug,2022 ]-currently on Synthroid  150 mcg/day-[increased in AUG, 2024-   Stable.   # Glaucoma BIL- L > R [Dr.King; [facility]] - ? Steroid induced on eye drops. decreased the steroids.  Discussed with ophthalmology- monitor for now. Stable.   # Lower back discomfort x 6 months-see above.  MRI lumbar spine as above.  Continue tramadol - refilled . stable  # Bone lesions /last zometa  on 11/27/2017.  S/p   Dental extraction PET scan evidence of any bone lesions.- consider Zometa   at next visit.   # Infection prophylaxis: Acyclovir ; asprin- add IVIG infusion [IgG < 400]- plan  IVIG- q 4 w #1 on- 9/17- Stable.   # IV access: mediport- functional  # ACP: Patient has  incurable cancer.  However patient is currently doing well. Patient interested in continue current scope of therapy; and full code.  # Vaccination: Flu shot today [sep 2024] s/p Pneumonia vaccination [march 2024]- stable.  Recommend RSV vaccination.   6 w- given dental extrac- on 1/26. :MM panel; K/l light chain ratio q 4 W  D-isa-dex;car1&2-car; weekly x 3; week 4-OFF;  q 28 day cycle; NO zometa - dental issues-ok; but hypocalemia  PS:  # DISPOSITION: #  chemo today;  and tomorrow. Today- venofer - # follow up as planned-     # add-  appt for until 5/26-5/27 [APP or X- MD] # add cbc.bmp;weekly; and in 4 weeks- MM panel/k-l  light chains- Dr.B  # 40 minutes face-to-face with the patient discussing the above plan of care; more than 50% of time spent on prognosis/ natural history; counseling and coordination.    Orders Placed This Encounter  Procedures   CBC with Differential (Cancer Center Only)    Standing Status:   Future    Expected Date:   12/20/2023    Expiration Date:   12/19/2024   CMP (Cancer Center only)    Standing Status:   Future    Expected Date:   12/20/2023    Expiration Date:   12/19/2024   CBC with Differential (Cancer Center Only)    Standing Status:   Standing    Number of Occurrences:   1    Expiration Date:   11/21/2024   Basic Metabolic Panel - Cancer Center Only     Standing Status:   Standing    Number of Occurrences:   1    Expiration Date:   11/21/2024   Multiple Myeloma Panel (SPEP&IFE w/QIG)    Standing Status:   Standing    Number of Occurrences:   1    Expiration Date:   11/21/2024   Kappa/lambda light chains    Standing Status:   Standing    Number of Occurrences:   1    Expiration Date:   11/21/2024   All questions were answered. The patient knows to call the clinic with any problems, questions or concerns.      [name], MD 11/22/2023 10:46 AM End

## 2023-11-22 NOTE — Assessment & Plan Note (Addendum)
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   MAY 2024- 1.3. ]  currently on Isa-Dex; d-2 carfil weekly q 28 days.  # MARCH 2025- M-protein -0.3 ; kappa lambda light chain ratio-1.8 lambda light chain 63-slowly RISINSG - monitor for now. FEB 2025- Ejection fraction improved. Awaiting re-evaluation at [facility] in MAY 2025.   # Proceed with Isa-Dex-Carfil- Labs-CBC/chemistries were reviewed with the patient.  # DROP in EF:  MARCH 2024-Echo [[facility]] Left ventricular EF 50 to 55%; AUG 13th/OCT 23rd, 2024- EF-45-50%.  FEB 26th, 2025- Left ventricular ejection fraction, by estimation, is 50 to 55%. The left ventricle has low normal function Recommend monitoring for now. Stable.   # Anemia-Hb 9-10- JAn 2025- I ron sat- 8%; ferritin- 19 on prn venofer - Stable.   # Electrolyte: Hypokalemia: mild-  continue compliance Kdur TID-; hypocalcemia- 8.0- vit D SEP 2024- 50- continie Ergo 50k weekly. continue ca BID    sep 2024- Vit D 54. Stable.   # Iatrogenic hypothyroidism [ Graves/goiter s/p total thyroidectomy;aug,2022 ]-currently on Synthroid  150 mcg/day-[increased in AUG, 2024-  Stable.   # Glaucoma BIL- L > R [Dr.King; [facility]] - ? Steroid induced on eye drops. decreased the steroids.  Discussed with ophthalmology- monitor for now. Stable.   # Lower back discomfort x 6 months-see above.  MRI lumbar spine as above.  Continue tramadol - refilled . stable  # Bone lesions /last zometa  on 11/27/2017.  S/p   Dental extraction PET scan evidence of any bone lesions.- consider Zometa   at next visit.   # Infection prophylaxis: Acyclovir ; asprin- add IVIG infusion [IgG < 400]- plan  IVIG- q 4 w #1 on- 9/17- Stable.   # IV access: mediport- functional  # ACP: Patient has  incurable cancer.  However patient is currently doing well. Patient interested in continue current scope of therapy; and full code.  # Vaccination:  Flu shot today [sep 2024] s/p Pneumonia vaccination [march 2024]- stable.  Recommend RSV vaccination.   6 w- given dental extrac- on 1/26. :MM panel; K/l light chain ratio q 4 W  D-isa-dex;car1&2-car; weekly x 3; week 4-OFF;  q 28 day cycle; NO zometa - dental issues-ok; but hypocalemia  PS:  # DISPOSITION: #  chemo today;  and tomorrow. Today- venofer - # follow up as planned-     # add-  appt for until 5/26-5/27 [APP or X- MD] # add cbc.bmp;weekly; and in 4 weeks- MM panel/k-l light chains- Dr.B  # 40 minutes face-to-face with the patient discussing the above plan of care; more than 50% of time spent on prognosis/ natural history; counseling and coordination.

## 2023-11-23 ENCOUNTER — Other Ambulatory Visit: Payer: Self-pay

## 2023-11-23 ENCOUNTER — Inpatient Hospital Stay

## 2023-11-23 ENCOUNTER — Encounter: Payer: Self-pay | Admitting: Internal Medicine

## 2023-11-23 VITALS — BP 147/87 | HR 68 | Temp 96.7°F | Resp 18

## 2023-11-23 DIAGNOSIS — Z5112 Encounter for antineoplastic immunotherapy: Secondary | ICD-10-CM | POA: Diagnosis not present

## 2023-11-23 DIAGNOSIS — C9000 Multiple myeloma not having achieved remission: Secondary | ICD-10-CM

## 2023-11-23 MED ORDER — DEXAMETHASONE SODIUM PHOSPHATE 10 MG/ML IJ SOLN
5.0000 mg | Freq: Once | INTRAMUSCULAR | Status: AC
Start: 2023-11-23 — End: 2023-11-23
  Administered 2023-11-23: 5 mg via INTRAVENOUS

## 2023-11-23 MED ORDER — DEXTROSE 5 % IV SOLN
56.0000 mg/m2 | Freq: Once | INTRAVENOUS | Status: AC
  Administered 2023-11-23: 100 mg via INTRAVENOUS
  Filled 2023-11-23: qty 30

## 2023-11-23 MED ORDER — SODIUM CHLORIDE 0.9 % IV SOLN
Freq: Once | INTRAVENOUS | Status: AC
Start: 2023-11-23 — End: 2023-11-23
  Filled 2023-11-23: qty 250

## 2023-11-23 MED ORDER — HEPARIN SOD (PORK) LOCK FLUSH 100 UNIT/ML IV SOLN
500.0000 [IU] | Freq: Once | INTRAVENOUS | Status: AC | PRN
  Administered 2023-11-23: 500 [IU]
  Filled 2023-11-23: qty 5

## 2023-11-23 MED ORDER — SODIUM CHLORIDE 0.9 % IV SOLN
Freq: Once | INTRAVENOUS | Status: AC
  Filled 2023-11-23: qty 250

## 2023-11-25 ENCOUNTER — Inpatient Hospital Stay
Admission: EM | Admit: 2023-11-25 | Discharge: 2023-12-06 | DRG: 853 | Disposition: A | Discharge status: Home Health | Attending: Internal Medicine | Admitting: Internal Medicine

## 2023-11-25 ENCOUNTER — Emergency Department

## 2023-11-25 ENCOUNTER — Other Ambulatory Visit: Payer: Self-pay

## 2023-11-25 DIAGNOSIS — R6520 Severe sepsis without septic shock: Secondary | ICD-10-CM | POA: Diagnosis present

## 2023-11-25 DIAGNOSIS — J440 Chronic obstructive pulmonary disease with acute lower respiratory infection: Secondary | ICD-10-CM | POA: Diagnosis present

## 2023-11-25 DIAGNOSIS — E872 Acidosis, unspecified: Secondary | ICD-10-CM | POA: Diagnosis present

## 2023-11-25 DIAGNOSIS — J449 Chronic obstructive pulmonary disease, unspecified: Secondary | ICD-10-CM | POA: Diagnosis present

## 2023-11-25 DIAGNOSIS — J189 Pneumonia, unspecified organism: Secondary | ICD-10-CM | POA: Diagnosis present

## 2023-11-25 DIAGNOSIS — Z9481 Bone marrow transplant status: Secondary | ICD-10-CM | POA: Diagnosis not present

## 2023-11-25 DIAGNOSIS — D696 Thrombocytopenia, unspecified: Secondary | ICD-10-CM | POA: Diagnosis present

## 2023-11-25 DIAGNOSIS — Z836 Family history of other diseases of the respiratory system: Secondary | ICD-10-CM

## 2023-11-25 DIAGNOSIS — D84821 Immunodeficiency due to drugs: Secondary | ICD-10-CM | POA: Diagnosis present

## 2023-11-25 DIAGNOSIS — E890 Postprocedural hypothyroidism: Secondary | ICD-10-CM | POA: Diagnosis present

## 2023-11-25 DIAGNOSIS — A4159 Other Gram-negative sepsis: Secondary | ICD-10-CM | POA: Diagnosis present

## 2023-11-25 DIAGNOSIS — A4151 Sepsis due to Escherichia coli [E. coli]: Secondary | ICD-10-CM | POA: Diagnosis present

## 2023-11-25 DIAGNOSIS — I110 Hypertensive heart disease with heart failure: Secondary | ICD-10-CM | POA: Diagnosis present

## 2023-11-25 DIAGNOSIS — I96 Gangrene, not elsewhere classified: Secondary | ICD-10-CM | POA: Diagnosis present

## 2023-11-25 DIAGNOSIS — D649 Anemia, unspecified: Secondary | ICD-10-CM | POA: Diagnosis not present

## 2023-11-25 DIAGNOSIS — L03116 Cellulitis of left lower limb: Secondary | ICD-10-CM | POA: Diagnosis present

## 2023-11-25 DIAGNOSIS — L0390 Cellulitis, unspecified: Secondary | ICD-10-CM

## 2023-11-25 DIAGNOSIS — Z8619 Personal history of other infectious and parasitic diseases: Secondary | ICD-10-CM

## 2023-11-25 DIAGNOSIS — A419 Sepsis, unspecified organism: Secondary | ICD-10-CM | POA: Diagnosis not present

## 2023-11-25 DIAGNOSIS — K219 Gastro-esophageal reflux disease without esophagitis: Secondary | ICD-10-CM | POA: Diagnosis present

## 2023-11-25 DIAGNOSIS — F419 Anxiety disorder, unspecified: Secondary | ICD-10-CM | POA: Diagnosis present

## 2023-11-25 DIAGNOSIS — Z9841 Cataract extraction status, right eye: Secondary | ICD-10-CM

## 2023-11-25 DIAGNOSIS — T63301A Toxic effect of unspecified spider venom, accidental (unintentional), initial encounter: Secondary | ICD-10-CM | POA: Diagnosis present

## 2023-11-25 DIAGNOSIS — Z79890 Hormone replacement therapy (postmenopausal): Secondary | ICD-10-CM

## 2023-11-25 DIAGNOSIS — Z1612 Extended spectrum beta lactamase (ESBL) resistance: Secondary | ICD-10-CM | POA: Diagnosis present

## 2023-11-25 DIAGNOSIS — E162 Hypoglycemia, unspecified: Secondary | ICD-10-CM | POA: Diagnosis not present

## 2023-11-25 DIAGNOSIS — B9689 Other specified bacterial agents as the cause of diseases classified elsewhere: Secondary | ICD-10-CM | POA: Diagnosis not present

## 2023-11-25 DIAGNOSIS — Z9842 Cataract extraction status, left eye: Secondary | ICD-10-CM

## 2023-11-25 DIAGNOSIS — Z9889 Other specified postprocedural states: Secondary | ICD-10-CM

## 2023-11-25 DIAGNOSIS — L0291 Cutaneous abscess, unspecified: Secondary | ICD-10-CM

## 2023-11-25 DIAGNOSIS — B9620 Unspecified Escherichia coli [E. coli] as the cause of diseases classified elsewhere: Secondary | ICD-10-CM | POA: Diagnosis not present

## 2023-11-25 DIAGNOSIS — Z79899 Other long term (current) drug therapy: Secondary | ICD-10-CM

## 2023-11-25 DIAGNOSIS — A4902 Methicillin resistant Staphylococcus aureus infection, unspecified site: Secondary | ICD-10-CM

## 2023-11-25 DIAGNOSIS — I5032 Chronic diastolic (congestive) heart failure: Secondary | ICD-10-CM | POA: Diagnosis present

## 2023-11-25 DIAGNOSIS — L02416 Cutaneous abscess of left lower limb: Secondary | ICD-10-CM | POA: Diagnosis present

## 2023-11-25 DIAGNOSIS — I10 Essential (primary) hypertension: Secondary | ICD-10-CM | POA: Diagnosis not present

## 2023-11-25 DIAGNOSIS — I700 Atherosclerosis of aorta: Secondary | ICD-10-CM | POA: Diagnosis present

## 2023-11-25 DIAGNOSIS — Z8701 Personal history of pneumonia (recurrent): Secondary | ICD-10-CM

## 2023-11-25 DIAGNOSIS — B9562 Methicillin resistant Staphylococcus aureus infection as the cause of diseases classified elsewhere: Secondary | ICD-10-CM | POA: Diagnosis not present

## 2023-11-25 DIAGNOSIS — C9002 Multiple myeloma in relapse: Secondary | ICD-10-CM

## 2023-11-25 DIAGNOSIS — M1712 Unilateral primary osteoarthritis, left knee: Secondary | ICD-10-CM | POA: Diagnosis present

## 2023-11-25 DIAGNOSIS — Z9484 Stem cells transplant status: Secondary | ICD-10-CM | POA: Diagnosis not present

## 2023-11-25 DIAGNOSIS — T451X5A Adverse effect of antineoplastic and immunosuppressive drugs, initial encounter: Secondary | ICD-10-CM | POA: Diagnosis present

## 2023-11-25 DIAGNOSIS — Z961 Presence of intraocular lens: Secondary | ICD-10-CM | POA: Diagnosis present

## 2023-11-25 DIAGNOSIS — A4102 Sepsis due to Methicillin resistant Staphylococcus aureus: Secondary | ICD-10-CM | POA: Diagnosis present

## 2023-11-25 DIAGNOSIS — E876 Hypokalemia: Secondary | ICD-10-CM | POA: Diagnosis not present

## 2023-11-25 DIAGNOSIS — Z95828 Presence of other vascular implants and grafts: Secondary | ICD-10-CM

## 2023-11-25 DIAGNOSIS — Z820 Family history of epilepsy and other diseases of the nervous system: Secondary | ICD-10-CM

## 2023-11-25 DIAGNOSIS — Z7982 Long term (current) use of aspirin: Secondary | ICD-10-CM

## 2023-11-25 DIAGNOSIS — Z87891 Personal history of nicotine dependence: Secondary | ICD-10-CM

## 2023-11-25 DIAGNOSIS — L02426 Furuncle of left lower limb: Secondary | ICD-10-CM | POA: Diagnosis present

## 2023-11-25 HISTORY — DX: Sepsis, unspecified organism: A41.9

## 2023-11-25 LAB — URINALYSIS, ROUTINE W REFLEX MICROSCOPIC
Bilirubin Urine: NEGATIVE
Glucose, UA: NEGATIVE mg/dL
Ketones, ur: NEGATIVE mg/dL
Nitrite: NEGATIVE
Protein, ur: 30 mg/dL — AB
Specific Gravity, Urine: >1.046 — ABNORMAL HIGH (ref 1.005–1.030)
pH: 5 (ref 5.0–8.0)

## 2023-11-25 LAB — COMPREHENSIVE METABOLIC PANEL WITH GFR
ALT: 10 U/L (ref 0–44)
AST: 18 U/L (ref 15–41)
Albumin: 3.8 g/dL (ref 3.5–5.0)
Alkaline Phosphatase: 64 U/L (ref 38–126)
Anion gap: 13 (ref 5–15)
BUN: 19 mg/dL (ref 8–23)
CO2: 18 mmol/L — ABNORMAL LOW (ref 22–32)
Calcium: 7.7 mg/dL — ABNORMAL LOW (ref 8.9–10.3)
Chloride: 105 mmol/L (ref 98–111)
Creatinine, Ser: 0.91 mg/dL (ref 0.44–1.00)
GFR, Estimated: >60 mL/min (ref >60)
Glucose, Bld: 90 mg/dL (ref 70–99)
Potassium: 2.4 mmol/L — CL (ref 3.5–5.1)
Sodium: 136 mmol/L (ref 135–145)
Total Bilirubin: 1.5 mg/dL — ABNORMAL HIGH (ref 0.0–1.2)
Total Protein: 6.6 g/dL (ref 6.5–8.1)

## 2023-11-25 LAB — MRSA NEXT GEN BY PCR, NASAL: MRSA by PCR Next Gen: NOT DETECTED

## 2023-11-25 LAB — CBC WITH DIFFERENTIAL/PLATELET
Abs Immature Granulocytes: 0.01 10*3/uL (ref 0.00–0.07)
Basophils Absolute: 0 10*3/uL (ref 0.0–0.1)
Basophils Relative: 0 %
Eosinophils Absolute: 0 10*3/uL (ref 0.0–0.5)
Eosinophils Relative: 0 %
HCT: 36.3 % (ref 36.0–46.0)
Hemoglobin: 11.6 g/dL — ABNORMAL LOW (ref 12.0–15.0)
Immature Granulocytes: 0 %
Lymphocytes Relative: 17 %
Lymphs Abs: 0.6 10*3/uL — ABNORMAL LOW (ref 0.7–4.0)
MCH: 27.8 pg (ref 26.0–34.0)
MCHC: 32 g/dL (ref 30.0–36.0)
MCV: 87.1 fL (ref 80.0–100.0)
Monocytes Absolute: 0.4 10*3/uL (ref 0.1–1.0)
Monocytes Relative: 11 %
Neutro Abs: 2.4 10*3/uL (ref 1.7–7.7)
Neutrophils Relative %: 72 %
Platelets: 63 10*3/uL — ABNORMAL LOW (ref 150–400)
RBC: 4.17 MIL/uL (ref 3.87–5.11)
RDW: 16.9 % — ABNORMAL HIGH (ref 11.5–15.5)
Smear Review: DECREASED
WBC Morphology: INCREASED
WBC: 3.5 10*3/uL — ABNORMAL LOW (ref 4.0–10.5)
nRBC: 1.4 % — ABNORMAL HIGH (ref 0.0–0.2)

## 2023-11-25 LAB — TROPONIN I (HIGH SENSITIVITY)
Troponin I (High Sensitivity): 23 ng/L — ABNORMAL HIGH (ref <18)
Troponin I (High Sensitivity): 18 ng/L — ABNORMAL HIGH (ref <18)

## 2023-11-25 LAB — LACTIC ACID, PLASMA
Lactic Acid, Venous: 3.5 mmol/L (ref 0.5–1.9)
Lactic Acid, Venous: 2.1 mmol/L (ref 0.5–1.9)

## 2023-11-25 LAB — POTASSIUM: Potassium: 3.6 mmol/L (ref 3.5–5.1)

## 2023-11-25 LAB — LIPASE, BLOOD: Lipase: 21 U/L (ref 11–51)

## 2023-11-25 LAB — GROUP A STREP BY PCR: Group A Strep by PCR: NOT DETECTED

## 2023-11-25 LAB — PHOSPHORUS: Phosphorus: 2.2 mg/dL — ABNORMAL LOW (ref 2.5–4.6)

## 2023-11-25 LAB — MAGNESIUM: Magnesium: 1.5 mg/dL — ABNORMAL LOW (ref 1.7–2.4)

## 2023-11-25 MED ORDER — BRINZOLAMIDE 1 % OP SUSP
1.0000 [drp] | Freq: Three times a day (TID) | OPHTHALMIC | Status: DC
  Administered 2023-11-26: 1 [drp] via OPHTHALMIC
  Filled 2023-11-25: qty 10

## 2023-11-25 MED ORDER — VANCOMYCIN HCL IN DEXTROSE 1-5 GM/200ML-% IV SOLN
1000.0000 mg | INTRAVENOUS | Status: AC
  Administered 2023-11-26 – 2023-11-29 (×4): 1000 mg via INTRAVENOUS
  Filled 2023-11-25 (×4): qty 200

## 2023-11-25 MED ORDER — IOHEXOL 350 MG/ML SOLN
110.0000 mL | Freq: Once | INTRAVENOUS | Status: AC | PRN
  Administered 2023-11-25: 110 mL via INTRAVENOUS

## 2023-11-25 MED ORDER — K PHOS MONO-SOD PHOS DI & MONO 155-852-130 MG PO TABS
500.0000 mg | ORAL_TABLET | Freq: Once | ORAL | Status: DC
  Filled 2023-11-25: qty 2

## 2023-11-25 MED ORDER — SODIUM CHLORIDE 0.9 % IV BOLUS
1000.0000 mL | Freq: Once | INTRAVENOUS | Status: AC
  Administered 2023-11-25: 1000 mL via INTRAVENOUS

## 2023-11-25 MED ORDER — MONTELUKAST SODIUM 10 MG PO TABS
10.0000 mg | ORAL_TABLET | Freq: Every day | ORAL | Status: DC
  Administered 2023-11-26 – 2023-12-05 (×10): 10 mg via ORAL
  Filled 2023-11-25 (×10): qty 1

## 2023-11-25 MED ORDER — ALBUTEROL SULFATE (2.5 MG/3ML) 0.083% IN NEBU: 3.0000 mL | INHALATION_SOLUTION | Freq: Four times a day (QID) | RESPIRATORY_TRACT | Status: DC | PRN

## 2023-11-25 MED ORDER — ACETAMINOPHEN 500 MG PO TABS
1000.0000 mg | ORAL_TABLET | Freq: Once | ORAL | Status: AC
  Administered 2023-11-25: 1000 mg via ORAL
  Filled 2023-11-25: qty 2

## 2023-11-25 MED ORDER — LACTATED RINGERS IV BOLUS (SEPSIS)
1000.0000 mL | Freq: Once | INTRAVENOUS | Status: AC
  Administered 2023-11-25: 1000 mL via INTRAVENOUS

## 2023-11-25 MED ORDER — FENTANYL CITRATE PF 50 MCG/ML IJ SOSY
25.0000 ug | PREFILLED_SYRINGE | Freq: Once | INTRAMUSCULAR | Status: AC
  Administered 2023-11-25: 25 ug via INTRAVENOUS
  Filled 2023-11-25: qty 1

## 2023-11-25 MED ORDER — BRIMONIDINE TARTRATE 0.2 % OP SOLN
1.0000 [drp] | Freq: Three times a day (TID) | OPHTHALMIC | Status: DC
  Administered 2023-11-26: 1 [drp] via OPHTHALMIC
  Filled 2023-11-25: qty 5

## 2023-11-25 MED ORDER — VANCOMYCIN HCL IN DEXTROSE 1-5 GM/200ML-% IV SOLN
1000.0000 mg | Freq: Once | INTRAVENOUS | Status: AC
  Administered 2023-11-25: 1000 mg via INTRAVENOUS
  Filled 2023-11-25: qty 200

## 2023-11-25 MED ORDER — TRAMADOL HCL 50 MG PO TABS
50.0000 mg | ORAL_TABLET | Freq: Two times a day (BID) | ORAL | Status: DC | PRN
  Administered 2023-11-26 – 2023-11-30 (×6): 50 mg via ORAL
  Filled 2023-11-25 (×6): qty 1

## 2023-11-25 MED ORDER — VANCOMYCIN HCL 500 MG/100ML IV SOLN
500.0000 mg | Freq: Once | INTRAVENOUS | Status: AC
  Administered 2023-11-25: 500 mg via INTRAVENOUS
  Filled 2023-11-25 (×2): qty 100

## 2023-11-25 MED ORDER — PANTOPRAZOLE SODIUM 40 MG PO TBEC
40.0000 mg | DELAYED_RELEASE_TABLET | Freq: Every day | ORAL | Status: DC
  Administered 2023-11-25 – 2023-12-06 (×12): 40 mg via ORAL
  Filled 2023-11-25 (×12): qty 1

## 2023-11-25 MED ORDER — ONDANSETRON HCL 4 MG/2ML IJ SOLN: 4.0000 mg | Freq: Four times a day (QID) | INTRAMUSCULAR | Status: DC | PRN

## 2023-11-25 MED ORDER — POLYETHYLENE GLYCOL 3350 17 G PO PACK: 17.0000 g | PACK | Freq: Every day | ORAL | Status: DC | PRN

## 2023-11-25 MED ORDER — SODIUM CHLORIDE 0.9 % IV SOLN: INTRAVENOUS | Status: DC

## 2023-11-25 MED ORDER — SODIUM CHLORIDE 0.9 % IV SOLN
2.0000 g | Freq: Once | INTRAVENOUS | Status: DC
  Filled 2023-11-25: qty 20

## 2023-11-25 MED ORDER — BRIMONIDINE TARTRATE 0.15 % OP SOLN: 1.0000 [drp] | Freq: Two times a day (BID) | OPHTHALMIC | Status: DC

## 2023-11-25 MED ORDER — ONDANSETRON HCL 4 MG PO TABS: 4.0000 mg | ORAL_TABLET | Freq: Four times a day (QID) | ORAL | Status: DC | PRN

## 2023-11-25 MED ORDER — ENOXAPARIN SODIUM 40 MG/0.4ML IJ SOSY
40.0000 mg | PREFILLED_SYRINGE | INTRAMUSCULAR | Status: DC
  Administered 2023-11-26 – 2023-11-29 (×4): 40 mg via SUBCUTANEOUS
  Filled 2023-11-25 (×4): qty 0.4

## 2023-11-25 MED ORDER — POTASSIUM CHLORIDE CRYS ER 20 MEQ PO TBCR
40.0000 meq | EXTENDED_RELEASE_TABLET | ORAL | Status: DC
  Administered 2023-11-25 (×2): 40 meq via ORAL
  Filled 2023-11-25 (×2): qty 2

## 2023-11-25 MED ORDER — PIPERACILLIN-TAZOBACTAM 3.375 G IVPB
3.3750 g | Freq: Once | INTRAVENOUS | Status: AC
  Administered 2023-11-25: 3.375 g via INTRAVENOUS
  Filled 2023-11-25: qty 50

## 2023-11-25 MED ORDER — CEFAZOLIN SODIUM-DEXTROSE 1-4 GM/50ML-% IV SOLN
1.0000 g | Freq: Three times a day (TID) | INTRAVENOUS | Status: DC
  Administered 2023-11-25 – 2023-11-27 (×5): 1 g via INTRAVENOUS
  Filled 2023-11-25 (×9): qty 50

## 2023-11-25 MED ORDER — MAGNESIUM SULFATE 2 GM/50ML IV SOLN
2.0000 g | Freq: Once | INTRAVENOUS | Status: AC
  Administered 2023-11-25: 2 g via INTRAVENOUS
  Filled 2023-11-25: qty 50

## 2023-11-25 MED ORDER — POTASSIUM CHLORIDE 20 MEQ PO PACK
40.0000 meq | PACK | ORAL | Status: AC
  Administered 2023-11-25 (×2): 40 meq via ORAL
  Filled 2023-11-25 (×2): qty 2

## 2023-11-25 MED ORDER — SODIUM CHLORIDE 0.9 % IV BOLUS
1000.0000 mL | Freq: Once | INTRAVENOUS | Status: AC
Start: 2023-11-25 — End: 2023-11-25
  Administered 2023-11-25: 1000 mL via INTRAVENOUS

## 2023-11-25 MED ORDER — LACTATED RINGERS IV BOLUS (SEPSIS)
250.0000 mL | Freq: Once | INTRAVENOUS | Status: AC
  Administered 2023-11-25: 250 mL via INTRAVENOUS

## 2023-11-25 MED ORDER — LEVOTHYROXINE SODIUM 50 MCG PO TABS
175.0000 ug | ORAL_TABLET | Freq: Every day | ORAL | Status: DC
  Administered 2023-11-26 – 2023-11-30 (×5): 175 ug via ORAL
  Filled 2023-11-25 (×2): qty 1
  Filled 2023-11-25: qty 4
  Filled 2023-11-25 (×2): qty 1

## 2023-11-25 MED ORDER — MIDODRINE HCL 5 MG PO TABS
5.0000 mg | ORAL_TABLET | Freq: Three times a day (TID) | ORAL | Status: DC
  Administered 2023-11-25 – 2023-11-28 (×9): 5 mg via ORAL
  Filled 2023-11-25 (×9): qty 1

## 2023-11-25 MED ORDER — LACTATED RINGERS IV BOLUS (SEPSIS): 1000.0000 mL | Freq: Once | INTRAVENOUS | Status: DC

## 2023-11-25 MED ORDER — ASPIRIN 81 MG PO TBEC
81.0000 mg | DELAYED_RELEASE_TABLET | Freq: Every day | ORAL | Status: DC
  Administered 2023-11-25 – 2023-12-06 (×12): 81 mg via ORAL
  Filled 2023-11-25 (×12): qty 1

## 2023-11-25 MED ORDER — ONDANSETRON HCL 4 MG/2ML IJ SOLN
4.0000 mg | Freq: Once | INTRAMUSCULAR | Status: AC
  Administered 2023-11-25: 4 mg via INTRAVENOUS
  Filled 2023-11-25: qty 2

## 2023-11-25 MED ORDER — POTASSIUM CHLORIDE 10 MEQ/100ML IV SOLN
10.0000 meq | INTRAVENOUS | Status: AC
Start: 2023-11-25 — End: 2023-11-25
  Administered 2023-11-25 (×2): 10 meq via INTRAVENOUS
  Filled 2023-11-25 (×2): qty 100

## 2023-11-25 NOTE — ED Notes (Signed)
 Patient cleaned of bowel incontinence, new depend and chux applied.

## 2023-11-25 NOTE — Consult Note (Signed)
 Pharmacy Antibiotic Note  ASSESSMENT: 73 y.o. female with PMH including multiple myeloma currently on chemotherapy, HFpEF, HTN, hypothyroidism is presenting with sepsis in the setting of cellulitis/wound infection. CT shows edema and swelling in left thigh area without evidence of abscess. Pharmacy has been consulted to manage vancomycin  dosing. Patient also receiving cefazolin .  Patient measurements: Height: 5\' 2"  (157.5 cm) Weight: 71.2 kg (157 lb) IBW/kg (Calculated) : 50.1  Vital signs: Temp: 99.8 F (37.7 C) (05/02 1020) Temp Source: Rectal (05/02 1020) BP: 85/55 (05/02 1215) Pulse Rate: 108 (05/02 1215) Recent Labs  Lab 11/22/23 0910 11/25/23 0703  WBC 4.5 3.5*  CREATININE 0.61 0.91   Estimated Creatinine Clearance: 52.4 mL/min (by C-G formula based on SCr of 0.91 mg/dL).  Allergies: No Known Allergies  Antimicrobials this admission: Zosyn  5/2 x 1 Vancomycin  5/2 >> Cefazolin  5/2 >>  Dose adjustments this admission: N/A  Microbiology results: 5/2 BCx: processing 5/2 GAS PCR: processing  5/2 MRSA PCR: processing  PLAN: Administer vancomycin  1500mg  IV as a loading dose, followed by vancomycin  1000mg  IV q24H thereafter eAUC 469, Cmax 31, Cmin 12 Scr 0.91, IBW, Vd 0.72 L/kg Initiate cefazolin  1g IV q8H Follow up culture results to assess for antibiotic optimization. Monitor renal function to assess for any necessary antibiotic dosing changes.   Thank you for allowing pharmacy to be a part of this patient's care.  Will M. Alva Jewels, PharmD Clinical Pharmacist 11/25/2023 12:54 PM

## 2023-11-25 NOTE — ED Notes (Signed)
Placed patient on 2L [place].

## 2023-11-25 NOTE — ED Notes (Signed)
 Dr. Raeann Buhl made aware of patient low BP & ordered

## 2023-11-25 NOTE — ED Notes (Addendum)
 MD Aleta Hutch about pt BP continuing to be low after receiving midodrine . MD also asked about changing her PO potassium to the powder you mix with water as she gagged and spit out the PO from earlier.

## 2023-11-25 NOTE — ED Notes (Signed)
 Pt  educated that we need a urine specimen. Pt understands at this time.

## 2023-11-25 NOTE — ED Provider Notes (Signed)
 [facility] Provider Note    Event Date/Time   First MD Initiated Contact with Patient 11/25/23 [phone]     (approximate)   History   Weakness and Nausea   HPI  Brandi [name] is a 73 y.o. female  with multiple myeloma who underwent infusion on 4/30 who comes in for weakness.  Patient has had some increased weakness and nausea after her cancer treatment.  No vomiting or diarrhea.  Patient does report some generalized abdominal pain.  Unclear exactly the when this began.  Does report some shortness of breath as well.  She also points to her left upper thigh and does report some pain at this area.  She is not sure if she was bit by a spider   Physical Exam   Triage Vital Signs: ED Triage Vitals  Encounter Vitals Group     BP 11/25/23 0647 118/68     Systolic BP Percentile --      Diastolic BP Percentile --      Pulse Rate 11/25/23 0647 (!) 116     Resp 11/25/23 0647 (!) 29     Temp 11/25/23 0647 98 F (36.7 C)     Temp Source 11/25/23 0647 Oral     SpO2 11/25/23 0647 100 %     Weight 11/25/23 0649 157 lb (71.2 kg)     Height 11/25/23 0649 5\' 2"  (1.575 m)     Head Circumference --      Peak Flow --      Pain Score 11/25/23 0649 8     Pain Loc --      Pain Education --      Exclude from Growth Chart --     Most recent vital signs: Vitals:   11/25/23 0647  BP: 118/68  Pulse: (!) 116  Resp: (!) 29  Temp: 98 F (36.7 C)  SpO2: 100%     General: Awake, no distress.  CV:  Good peripheral perfusion.  Resp:  Increased work of breathing Abd:  No distention.  Other:  Patient has a lesion noted on the left upper thigh posterior surface.  There is some surrounding redness.  No obvious crepitus felt.  Her abdomen feels tender throughout   ED Results / Procedures / Treatments   Labs (all labs ordered are listed, but only abnormal results are displayed) Labs Reviewed  CBC WITH DIFFERENTIAL/PLATELET - Abnormal; Notable for the following  components:      Result Value   WBC 3.5 (*)    Hemoglobin 11.6 (*)    RDW 16.9 (*)    Platelets 63 (*)    nRBC 1.4 (*)    Lymphs Abs 0.6 (*)    All other components within normal limits  COMPREHENSIVE METABOLIC PANEL WITH GFR - Abnormal; Notable for the following components:   Potassium 2.4 (*)    CO2 18 (*)    Calcium  7.7 (*)    Total Bilirubin 1.5 (*)    All other components within normal limits  MAGNESIUM  - Abnormal; Notable for the following components:   Magnesium  1.5 (*)    All other components within normal limits  LACTIC ACID, PLASMA - Abnormal; Notable for the following components:   Lactic Acid, Venous 3.5 (*)    All other components within normal limits  TROPONIN I (HIGH SENSITIVITY) - Abnormal; Notable for the following components:   Troponin I (High Sensitivity) 23 (*)    All other components within normal limits  CULTURE, BLOOD (  ROUTINE X 2)  CULTURE, BLOOD (ROUTINE X 2)  LIPASE, BLOOD  LACTIC ACID, PLASMA  URINALYSIS, ROUTINE W REFLEX MICROSCOPIC     EKG  My interpretation of EKG:  Sinus tachy rate of 111, no st elevation, no twi, normal intervals.   RADIOLOGY I have reviewed the CT personally interpreted no air in thigh or abscess noted    PROCEDURES:  Critical Care performed: Yes, see critical care procedure note(s)  .Critical Care  Performed by: Lubertha Rush, MD Authorized by: Lubertha Rush, MD   Critical care provider statement:    Critical care time (minutes):  30   Critical care was necessary to treat or prevent imminent or life-threatening deterioration of the following conditions:  Sepsis   Critical care was time spent personally by me on the following activities:  Development of treatment plan with patient or surrogate, discussions with consultants, evaluation of patient's response to treatment, examination of patient, ordering and review of laboratory studies, ordering and review of radiographic studies, ordering and performing treatments  and interventions, pulse oximetry, re-evaluation of patient's condition and review of old charts .1-3 Lead EKG Interpretation  Performed by: Lubertha Rush, MD Authorized by: Lubertha Rush, MD     Interpretation: abnormal     ECG rate:  116   ECG rate assessment: tachycardic     Rhythm: sinus tachycardia     Ectopy: none     Conduction: normal      MEDICATIONS ORDERED IN ED: Medications  lactated ringers  bolus 1,000 mL (has no administration in time range)    And  lactated ringers  bolus 1,000 mL (has no administration in time range)    And  lactated ringers  bolus 250 mL (has no administration in time range)  cefTRIAXone  (ROCEPHIN ) 2 g in sodium chloride  0.9 % 100 mL IVPB (has no administration in time range)  vancomycin  (VANCOCIN ) IVPB 1000 mg/200 mL premix (has no administration in time range)  magnesium  sulfate IVPB 2 g 50 mL (has no administration in time range)  potassium chloride  10 mEq in 100 mL IVPB (has no administration in time range)  sodium chloride  0.9 % bolus 1,000 mL (1,000 mLs Intravenous New Bag/Given 11/25/23 0736)  ondansetron  (ZOFRAN ) injection 4 mg (4 mg Intravenous Given 11/25/23 0744)  fentaNYL  (SUBLIMAZE ) injection 25 mcg (25 mcg Intravenous Given 11/25/23 0743)     IMPRESSION / MDM / ASSESSMENT AND PLAN / ED COURSE  I reviewed the triage vital signs and the nursing notes.   Patient's presentation is most consistent with acute presentation with potential threat to life or bodily function.   Patient comes in with concerns for abdominal pain patient is tachycardic.  Labs were ordered to evaluate for dehydration, AKI, electrolyte abnormalities.  Patient is lactate is elevated at 3.5.  White count low at 3.5.  Patient now meets sepsis criteria.  I am concerned about cellulitis of her left leg so I have started patient on broad-spectrum antibiotics for this.  Patient also noted to have low potassium and low magnesium  which I will begin to replete.  Troponin slightly  elevated but suspect that is more likely demand at this time.  Patient given a full 30 cc/kg fluid resuscitation.   I will order CT imaging given she reports abdominal pain to evaluate for acute intra-abdominal pathology as well as a CT of her femur just to ensure no evidence of gas, abscess although seems more consistent with cellulitis but does have a lesion that she is not  sure if she could have had a bug bite out.  It has been there for a few days and getting progressively worse so doubt necrotizing fasciitis.  No skin changes to suggest that, no crepitus.  I will get CT PE given patient is tachycardic, respiratory rate elevated, in the setting of known cancer.  Patient will require admission to the hospital after CT imaging  1. Moderate medial and posteromedial and mild anteromedial proximal  to mid left thigh subcutaneous fat edema and swelling. Moderate  thickening of the regional left thigh skin. No walled-off fluid  collection is seen to indicate an abscess.  2. Mild focal thickening/irregularity of the skin at the posterior  aspect of the proximal to mid left thigh. This may represent the  region of the reported posterior thigh weeping wound.  3. No high-grade fascial thickening, subcutaneous air, or  perifascial air is seen to suggest CT evidence of necrotizing  fasciitis.  4. No definite destructive lytic lesion is seen within the left  femur in this patient with history of multiple myeloma.  5. 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   MPRESSION: 1. No CT evidence for acute pulmonary embolus. 2. No acute findings in the abdomen or pelvis. Specifically, no findings to explain the patient's history of abdominal pain. 3. Endometrial stripe appears thickened at 13 mm on sagittal imaging. Correlation for abnormal vaginal bleeding recommended. Follow-up pelvic ultrasound recommended to further evaluate as endometrial neoplasm  could have this appearance. 4. Calcified uterine fibroids. 5. Two 2 mm left lung nodules. No follow-up needed if patient is low-risk (and has no known or suspected primary neoplasm). Non-contrast chest CT can be considered in 12 months if patient is high-risk. This recommendation follows the consensus statement: Guidelines for Management of Incidental Pulmonary Nodules Detected on CT Images: From the Fleischner Society 2017; Radiology 2017; 284:228-243. 6.  Aortic Atherosclerosis (ICD10-I70.0).    Discussed with patient need to follow-up for lung nodules.  She denies any vaginal bleeding and discussed with patient following up for pelvic ultrasound.  11:17 AM  Patient's lactate is downtrending.  Her heart rate still remains slightly elevated her maps her blood pressures have been over 65.  She did have an episode of some increasing shortness of breath but she does report using oxygen as needed at home she was placed on 3 L.  Not really see any significant edema on repeat x-ray.  Given her maps over 82 we will start off with discussed with the hospitalist for admission although if her pressures continue to drop she may require ICU but at this time patient seems to be improving with fluids, antibiotics.  The patient is on the cardiac monitor to evaluate for evidence of arrhythmia and/or significant heart rate changes.      FINAL CLINICAL IMPRESSION(S) / ED DIAGNOSES   Final diagnoses:  Sepsis, due to unspecified organism, unspecified whether acute organ dysfunction present (HCC)  Cellulitis of left lower extremity  Hypomagnesemia  Hypokalemia     Rx / DC Orders   ED Discharge Orders     None        Note:  This document was prepared using Dragon voice recognition software and may include unintentional dictation errors.   Lubertha Rush, MD 11/25/23 [phone]

## 2023-11-25 NOTE — Consult Note (Signed)
 Pharmacy Consult Note - Electrolytes  Sodium (mmol/L)  Date Value  11/25/2023 136  01/25/2020 141   Potassium (mmol/L)  Date Value  11/25/2023 2.4 (LL)   Magnesium  (mg/dL)  Date Value  16/04/9603 1.5 (L)   Calcium  (mg/dL)  Date Value  54/03/8118 7.7 (L)   Albumin (g/dL)  Date Value  14/78/2956 3.8   Phosphorus (mg/dL)  Date Value  21/30/8657 2.2 (L)   Corrected Ca: 7.9 mg/dL  ASSESSMENT: 73 y.o. female with PMH including multiple myeloma who last received chemotherapy on 4/30 presents with sepsis. Pharmacy has been consulted to monitor and replace electrolytes. Patient's renal function is currently stable.  Lab Results  Component Value Date   CREATININE 0.91 11/25/2023   CREATININE 0.61 11/22/2023   CREATININE 0.53 11/14/2023    mIVF: NS @ 125 mL/hr  Pertinent medications: N/A  Goal of Therapy:  Electrolytes WNL   PLAN: K 2.4 >> care team ordered potassium chloride  10mEq IV q1H x 2 along with potassium chloride  40mEq PO q2H x 3 for a total of 140 mEq repletion Mg 1.5 >> care team ordered magnesium  sulfate 2g IV x 1 Phos 2.2 >> K-Phos Neutral tabs 500mg  PO x 1 No other electrolyte replacement currently warranted Potassium recheck timed for tonight at 2000 Check other electrolytes with next AM labs   Thank you for allowing pharmacy to be a part of this patient's care.  Will M. Alva Jewels, PharmD Clinical Pharmacist 11/25/2023 12:36 PM

## 2023-11-25 NOTE — H&P (Signed)
 History and Physical    Brandi [name] ZDG:[id number] DOB: 04-Jan-1951 DOA: 11/25/2023  PCP: [name], MD (Confirm with patient/family/NH records and if not entered, this has to be entered at [facility] point of entry) Patient coming from: Home  I have personally briefly reviewed patient's old medical records in [facility]  Chief Complaint: Nausea, abd pain, left leg swelling and pain  HPI: Brandi [name] is a 73 y.o. female with medical history significant of MM on chemotherapy, chronic HFpEF, HTN, hypothyroidism, presented with GI symptoms including nauseous vomiting abdominal pain.  She also complains about worsening of left leg swelling pain and drainage.  Patient has history of multiple myeloma since 2018 and has been on chemotherapy, last chemo was this past Tuesday.  Started on Wednesday, patient started to feel nauseous and cramping-like lower abdominal pain, with multiple vomiting on Wednesday but no more vomiting since Thursday.  Location of abdominal pain is rather poorly defined.  No exacerbating factors.  2 days ago she also started to notice " a boil" in the left inner thigh, gradually getting bigger and ruptured yesterday with thick yellowish pus coming out.  She has had some chills but no fever.  No chest pain shortness of breath no diarrhea denies any urinary symptoms.  Denied any active bleeding.   ED Course: Afebrile, tachycardia heart rate 110, blood pressure was 70/40 responded to IV fluid improved to 90/70, not hypoxic.  CTA negative for PE, CT abdomen pelvis showed no acute finding to explain patient symptoms, suspicious signs for endometrial stripe thickening, but patient denied any active vaginal bleeding.  CT of bilateral thighs also identified cellulitis like changes on the posterior and medium side of upper one third thigh on the left, no collective fluid identified.  Blood work showed WBC 7.5, hemoglobin 11.6, platelet 63, sodium 136 potassium 2.4 bicarb 18 BUN  19 creatinine 0.9 glucose 90 magnesium  1.5  Patient was given IV bolus 2.0 L and vancomycin  x 1.  Review of Systems: As per HPI otherwise 14 point review of systems negative.    Past Medical History:  Diagnosis Date   (HFpEF) heart failure with preserved ejection fraction (HCC)    a. 06/2020 Echo: EF 60-65%, no rwma, nl RV size/fxn. Triv MR. Triv TR/AI. Mod elev PASP.   Anxiety    COPD (chronic obstructive pulmonary disease) (HCC)    GERD (gastroesophageal reflux disease)    Hypertension    Hyperthyroidism    Hypokalemia    IgA myeloma (HCC)    Multiple myeloma (HCC)    Pneumonia    Red blood cell antibody positive, compatible PRBC difficult to obtain    S/P autologous bone marrow transplantation [facility])     Past Surgical History:  Procedure Laterality Date   ANTERIOR VITRECTOMY Left 10/07/2021   Procedure: ANTERIOR VITRECTOMY;  Surgeon: Annell Kidney, MD;  Location: [facility];  Service: Ophthalmology;  Laterality: Left;   CATARACT EXTRACTION W/PHACO Left 10/07/2021   Procedure: CATARACT EXTRACTION PHACO AND INTRAOCULAR LENS PLACEMENT (IOC) LEFT VISION BLUE;  Surgeon: Annell Kidney, MD;  Location: [facility];  Service: Ophthalmology;  Laterality: Left;  kit for manual incision 14.56 01:21.4   CATARACT EXTRACTION W/PHACO Right 10/21/2021   Procedure: CATARACT EXTRACTION PHACO AND INTRAOCULAR LENS PLACEMENT (IOC) RIGHT 11.12 01:48.1;  Surgeon: Annell Kidney, MD;  Location: [facility];  Service: Ophthalmology;  Laterality: Right;   ESOPHAGOGASTRODUODENOSCOPY (EGD) WITH PROPOFOL  N/A 03/30/2019   Procedure: ESOPHAGOGASTRODUODENOSCOPY (EGD) WITH PROPOFOL ;  Surgeon: Antony Baumgartner,  Lenton Rail, MD;  Location: [facility] END[facility]COPY;  Service: Gastroenterology;  Laterality: N/A;   IR BONE MARROW BIOPSY & ASPIRATION  05/25/2023   IR FLUORO GUIDE PORT INSERTION RIGHT  12/16/2017   IR IMAGING GUIDED PORT INSERTION  06/15/2021   KNEE ARTHROSCOPY Left 09/03/2022    Procedure: ARTHROSCOPY KNEE DEBRIDEMENT;  Surgeon: Jerlyn Moons, MD;  Location: [facility] ORS;  Service: Orthopedics;  Laterality: Left;   THYROIDECTOMY N/A      reports that she quit smoking about 2 years ago. Her smoking use included cigarettes. She has never used smokeless tobacco. She reports that she does not drink alcohol and does not use drugs.  No Known Allergies  Family History  Problem Relation Age of Onset   Pneumonia Mother    Seizures Father      Prior to Admission medications   Medication Sig Start Date End Date Taking? Authorizing Provider  acyclovir  (ZOVIRAX ) 400 MG tablet Take 1 tablet (400 mg total) by mouth 2 (two) times daily. 07/08/23   [name], [name], MD  albuterol  (VENTOLIN  HFA) 108 (90 Base) MCG/ACT inhaler Inhale 2 puffs into the lungs every 6 (six) hours as needed for wheezing or shortness of breath. 10/10/23   [name], [name], MD  aspirin  EC 81 MG EC tablet Take 1 tablet (81 mg total) by mouth daily. 01/25/20   Walker, Caitlin S, NP  brimonidine  (ALPHAGAN  P) 0.1 % SOLN Place 1 drop into both eyes 2 (two) times daily.    [provider]  Brinzolamide -Brimonidine  (SIMBRINZA) 1-0.2 % SUSP Apply 1 drop to eye in the morning and at bedtime. I drop both eyes twice a day    [provider]  calcium  carbonate (CALCIUM  600 HIGH POTENCY) 600 MG TABS tablet Take 2 tablets (1,200 mg total) by mouth 2 (two) times daily. 08/05/23   Nelda Balsam, NP  carvedilol  (COREG ) 3.125 MG tablet Take 1 tablet (3.125 mg total) by mouth 2 (two) times daily with a meal. 11/27/21   Furth, Cadence H, PA-C  furosemide  (LASIX ) 40 MG tablet Take 1 tablet (40 mg total) by mouth daily. 10/02/21   Furth, Cadence H, PA-C  Latanoprostene Bunod (VYZULTA) 0.024 % SOLN Apply 1 drop to eye at bedtime. 1 drop Left eye every night Patient not taking: Reported on 11/22/2023    [provider]  levothyroxine  (SYNTHROID ) 175 MCG tablet Take 1 tablet (175 mcg total) by mouth  daily. For thyroid  11/22/23   [name], [name], MD  lidocaine -prilocaine  (EMLA ) cream Apply 1 application topically as needed. Apply to port and cover with saran wrap 1-2 hours prior to port access 07/03/21   [name], [name], MD  magnesium  oxide (MAG-OX) 400 (240 Mg) MG tablet Take 1 tablet (400 mg total) by mouth 2 (two) times daily. 07/08/23   [name], [name], MD  montelukast  (SINGULAIR ) 10 MG tablet Take 1 tablet (10 mg total) by mouth at bedtime. 11/09/22   [name], [name], MD  ondansetron  (ZOFRAN ) 8 MG tablet One pill every 8 hours as needed for nausea/vomitting. 10/11/23   Borders, Carlene Che, NP  pantoprazole  (PROTONIX ) 40 MG tablet Take 1 tablet (40 mg total) by mouth daily. 03/23/23 09/12/23  [name], [name], MD  polyethylene glycol (MIRALAX  / GLYCOLAX ) packet Take 17 g by mouth daily as needed for mild constipation. 04/11/17   Gouru, Aruna, MD  potassium chloride  SA (KLOR-CON  M) 20 MEQ tablet Take 3 tablets (60 mEq total) by mouth daily. For low potassium 08/05/23  Nelda Balsam, NP  spironolactone  (ALDACTONE ) 25 MG tablet Take 1 tablet by mouth once daily 04/14/22   End, [name], MD  traMADol  (ULTRAM ) 50 MG tablet Take 1 tablet (50 mg total) by mouth every 12 (twelve) hours as needed. 11/22/23   [name], MD  Vitamin D , Ergocalciferol , (DRISDOL ) 1.25 MG (50000 UNIT) CAPS capsule Take 1 capsule by mouth once a week 09/30/23   [name], MD    Physical Exam: Vitals:   11/25/23 1045 11/25/23 1100 11/25/23 1130 11/25/23 1215  BP: (!) 70/43 94/68 94/60  (!) 85/55  Pulse: (!) 116 (!) 110 (!) 111 (!) 108  Resp: (!) 30 (!) 23 (!) 23 (!) 21  Temp:      TempSrc:      SpO2: 99% 100% 98% 99%  Weight:      Height:        Constitutional: NAD, calm, comfortable Vitals:   11/25/23 1045 11/25/23 1100 11/25/23 1130 11/25/23 1215  BP: (!) 70/43 94/68 94/60  (!) 85/55  Pulse: (!) 116 (!) 110 (!) 111 (!) 108  Resp: (!) 30 (!) 23 (!) 23 (!) 21  Temp:       TempSrc:      SpO2: 99% 100% 98% 99%  Weight:      Height:       Eyes: PERRL, lids and conjunctivae normal ENMT: Mucous membranes are moist. Posterior pharynx clear of any exudate or lesions.Normal dentition.  Neck: normal, supple, no masses, no thyromegaly Respiratory: clear to auscultation bilaterally, no wheezing, no crackles. Normal respiratory effort. No accessory muscle use.  Cardiovascular: Regular rate and rhythm, no murmurs / rubs / gallops. No extremity edema. 2+ pedal pulses. No carotid bruits.  Abdomen: no tenderness, no masses palpated. No hepatosplenomegaly. Bowel sounds positive.  Musculoskeletal: no clubbing / cyanosis. No joint deformity upper and lower extremities. Good ROM, no contractures. Normal muscle tone.  Skin: Marked diarrhea-year-old erythematous and maculopapular rash on the left upper one third medium side of the thigh, draining thick yellowish pus, positive tenderness but no significant undulation palpated Neurologic: CN 2-12 grossly intact. Sensation intact, DTR normal. Strength 5/5 in all 4.  Psychiatric: Normal judgment and insight. Alert and oriented x 3. Normal mood.     Labs on Admission: I have personally reviewed following labs and imaging studies  CBC: Recent Labs  Lab 11/22/23 0910 11/25/23 0703  WBC 4.5 3.5*  NEUTROABS 2.4 2.4  HGB 10.7* 11.6*  HCT 33.7* 36.3  MCV 86.6 87.1  PLT 110* 63*   Basic Metabolic Panel: Recent Labs  Lab 11/22/23 0910 11/25/23 0703 11/25/23 0929  NA 140 136  --   K 3.1* 2.4*  --   CL 111 105  --   CO2 21* 18*  --   GLUCOSE 110* 90  --   BUN 13 19  --   CREATININE 0.61 0.91  --   CALCIUM  8.0* 7.7*  --   MG  --  1.5*  --   PHOS  --   --  2.2*   GFR: Estimated Creatinine Clearance: 52.4 mL/min (by C-G formula based on SCr of 0.91 mg/dL). Liver Function Tests: Recent Labs  Lab 11/22/23 0910 11/25/23 0703  AST 13* 18  ALT 8 10  ALKPHOS 71 64  BILITOT 0.5 1.5*  PROT 6.4* 6.6  ALBUMIN 3.8 3.8    Recent Labs  Lab 11/25/23 0703  LIPASE 21   No results for input(s): "AMMONIA" in the last 168 hours. Coagulation Profile:  No results for input(s): "INR", "PROTIME" in the last 168 hours. Cardiac Enzymes: No results for input(s): "CKTOTAL", "CKMB", "CKMBINDEX", "TROPONINI" in the last 168 hours. BNP (last 3 results) No results for input(s): "PROBNP" in the last 8760 hours. HbA1C: No results for input(s): "HGBA1C" in the last 72 hours. CBG: No results for input(s): "GLUCAP" in the last 168 hours. Lipid Profile: No results for input(s): "CHOL", "HDL", "LDLCALC", "TRIG", "CHOLHDL", "LDLDIRECT" in the last 72 hours. Thyroid  Function Tests: No results for input(s): "TSH", "T4TOTAL", "FREET4", "T3FREE", "THYROIDAB" in the last 72 hours. Anemia Panel: No results for input(s): "VITAMINB12", "FOLATE", "FERRITIN", "TIBC", "IRON ", "RETICCTPCT" in the last 72 hours. Urine analysis:    Component Value Date/Time   COLORURINE STRAW (A) 05/25/2021 0049   APPEARANCEUR CLEAR (A) 05/25/2021 0049   LABSPEC 1.008 05/25/2021 0049   PHURINE 5.0 05/25/2021 0049   GLUCOSEU NEGATIVE 05/25/2021 0049   HGBUR MODERATE (A) 05/25/2021 0049   BILIRUBINUR NEGATIVE 05/25/2021 0049   KETONESUR NEGATIVE 05/25/2021 0049   PROTEINUR 30 (A) 05/25/2021 0049   NITRITE NEGATIVE 05/25/2021 0049   LEUKOCYTESUR NEGATIVE 05/25/2021 0049    Radiological Exams on Admission: DG Chest Portable 1 View Result Date: 11/25/2023 CLINICAL DATA:  Shortness of breath, weakness, nausea EXAM: PORTABLE CHEST 1 VIEW COMPARISON:  08/24/2023 FINDINGS: Power injectable right Port-A-Cath tip: SVC. Atherosclerotic calcification of the aortic arch. Upper zone pulmonary vascular prominence raises the possibility of pulmonary venous hypertension. Upper normal heart size. Suspected linear/bandlike subsegmental atelectasis or scarring at the left lung base. Deep sulcus on the left but without compelling findings of pneumothorax. IMPRESSION: 1.  Upper zone pulmonary vascular prominence raises the possibility of pulmonary venous hypertension. 2. Suspected linear/bandlike subsegmental atelectasis or scarring at the left lung base. 3. Aortic Atherosclerosis (ICD10-I70.0). Electronically Signed   By: Freida Jes M.D.   On: 11/25/2023 11:17   CT FEMUR LEFT W CONTRAST Result Date: 11/25/2023 CLINICAL DATA:  Cellulitis. Tender. Evaluate for necrotizing fasciitis or abscess. Increased weakness and nausea after cancer treatment 3 days ago. Weeping wound to back of left thigh. History of multiple myeloma. EXAM: CT OF THE LOWER LEFT EXTREMITY WITH CONTRAST TECHNIQUE: Multidetector CT imaging of the lower left extremity was performed according to the standard protocol following intravenous contrast administration. RADIATION DOSE REDUCTION: This exam was performed according to the departmental dose-optimization program which includes automated exposure control, adjustment of the mA and/or kV according to patient size and/or use of iterative reconstruction technique. CONTRAST:  OMNIPAQUE  IOHEXOL  350 MG/ML SOLN COMPARISON:  Left knee radiographs 09/03/2022, pelvis and left hip radiographs 12/05/2017 FINDINGS: Bones/Joint/Cartilage No definite destructive lytic lesion is seen within the left femur in this patient with history of multiple myeloma. Moderate left femoroacetabular joint space narrowing and moderate superolateral left acetabular degenerative osteophytosis. Severe pubic symphysis joint space narrowing with subchondral sclerosis and mild superior osteophytosis. On coronal images there is partial visualization of the right knee with moderate to severe medial current joint space narrowing and subchondral sclerosis and mild to moderate peripheral osteophytosis. No acute fracture dislocation.  No cortical erosion. Ligaments Suboptimally assessed by CT. Muscles and Tendons Mild fatty infiltration of the semimembranosus muscle. Inflammatory fat stranding  at the posteromedial border of the cm cyst muscle (axial series 7, images 106 through 166, with trace fluid just posteromedial to the muscle in this region. Similar moderate inflammatory stranding/edema within the fat posterior and medial to the gracilis muscle. No air is seen tracking along the fascial planes to indicate necrotizing fasciitis.  Soft tissues Moderate medial and posteromedial and mild anteromedial proximal to mid left thigh subcutaneous fat edema and swelling. Moderate thickening of the medial and posteromedial proximal to mid left thigh skin. No walled-off fluid collection is seen to indicate an abscess. Mild focal thickening/irregularity of the skin at the posterior aspect of the proximal to mid left thigh (axial series 7, image 111 and sagittal series 11, image 117). This may represent the region of the reported posterior thigh weeping wound. Moderate to high-grade atherosclerotic calcifications. IMPRESSION: 1. Moderate medial and posteromedial and mild anteromedial proximal to mid left thigh subcutaneous fat edema and swelling. Moderate thickening of the regional left thigh skin. No walled-off fluid collection is seen to indicate an abscess. 2. Mild focal thickening/irregularity of the skin at the posterior aspect of the proximal to mid left thigh. This may represent the region of the reported posterior thigh weeping wound. 3. No high-grade fascial thickening, subcutaneous air, or perifascial air is seen to suggest CT evidence of necrotizing fasciitis. 4. No definite destructive lytic lesion is seen within the left femur in this patient with history of multiple myeloma. 5. 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 Electronically Signed   By: Bertina Broccoli M.D.   On: 11/25/2023 11:08   CT ABDOMEN PELVIS W CONTRAST Result Date: 11/25/2023 CLINICAL DATA:  Increased weakness with nausea after cancer treatment. Worsening abdominal pain. Personal  history of multiple myeloma. EXAM: CT ANGIOGRAPHY CHEST CT ABDOMEN AND PELVIS WITH CONTRAST TECHNIQUE: Multidetector CT imaging of the chest was performed using the standard protocol during bolus administration of intravenous contrast. Multiplanar CT image reconstructions and MIPs were obtained to evaluate the vascular anatomy. Multidetector CT imaging of the abdomen and pelvis was performed using the standard protocol during bolus administration of intravenous contrast. RADIATION DOSE REDUCTION: This exam was performed according to the departmental dose-optimization program which includes automated exposure control, adjustment of the mA and/or kV according to patient size and/or use of iterative reconstruction technique. CONTRAST:  OMNIPAQUE  IOHEXOL  350 MG/ML SOLN COMPARISON:  PET-CT 09/30/2022. FINDINGS: CTA CHEST FINDINGS Cardiovascular: The heart is enlarged. Coronary artery calcification is evident. Moderate atherosclerotic calcification is noted in the wall of the thoracic aorta. No thoracic aortic aneurysm or dissection. There is no filling defect within the opacified pulmonary arteries to suggest the presence of an acute pulmonary embolus. Right Port-A-Cath tip is positioned in the distal SVC. Mediastinum/Nodes: No mediastinal lymphadenopathy. There is no hilar lymphadenopathy. The esophagus has normal imaging features. There is no axillary lymphadenopathy. Lungs/Pleura: Dependent atelectasis noted in the lung bases, right greater than left. 2 mm left upper lobe nodule identified on image 35/8. 2 mm left lower lobe nodule identified on 71/8. No pulmonary edema or pleural effusion. Musculoskeletal: No overtly suspicious lytic or sclerotic osseous abnormality in this patient with a reported history of multiple myeloma. Review of the MIP images confirms the above findings. CT ABDOMEN and PELVIS FINDINGS Hepatobiliary: No suspicious focal abnormality within the liver parenchyma. Scattered tiny  hypodensities in the liver parenchyma are too small to characterize but are statistically most likely benign. No followup imaging is recommended. There is no evidence for gallstones, gallbladder wall thickening, or pericholecystic fluid. No intrahepatic or extrahepatic biliary dilation. Pancreas: No focal mass lesion. No dilatation of the main duct. No intraparenchymal cyst. No peripancreatic edema. Spleen: No splenomegaly. No suspicious focal mass lesion. Adrenals/Urinary Tract: No adrenal nodule or mass. Kidneys unremarkable. No evidence for hydroureter. The urinary bladder appears normal for  the degree of distention. Stomach/Bowel: Stomach is unremarkable. No gastric wall thickening. No evidence of outlet obstruction. Duodenum is normally positioned as is the ligament of Treitz. No small bowel wall thickening. No small bowel dilatation. The terminal ileum is normal. The appendix is normal. No gross colonic mass. No colonic wall thickening. Vascular/Lymphatic: There is advanced atherosclerotic calcification of the abdominal aorta without aneurysm. There is no gastrohepatic or hepatoduodenal ligament lymphadenopathy. No retroperitoneal or mesenteric lymphadenopathy. No pelvic sidewall lymphadenopathy. Reproductive: Calcified uterine fibroids evident. Endometrial stripe appears thickened at 13 mm on sagittal imaging. There is no adnexal mass. Other: No intraperitoneal free fluid. Musculoskeletal: No worrisome lytic or sclerotic osseous abnormality. Degenerative changes noted lumbar spine. Review of the MIP images confirms the above findings. IMPRESSION: 1. No CT evidence for acute pulmonary embolus. 2. No acute findings in the abdomen or pelvis. Specifically, no findings to explain the patient's history of abdominal pain. 3. Endometrial stripe appears thickened at 13 mm on sagittal imaging. Correlation for abnormal vaginal bleeding recommended. Follow-up pelvic ultrasound recommended to further evaluate as  endometrial neoplasm could have this appearance. 4. Calcified uterine fibroids. 5. Two 2 mm left lung nodules. No follow-up needed if patient is low-risk (and has no known or suspected primary neoplasm). Non-contrast chest CT can be considered in 12 months if patient is high-risk. This recommendation follows the consensus statement: Guidelines for Management of Incidental Pulmonary Nodules Detected on CT Images: From the Fleischner Society 2017; Radiology 2017; 284:228-243. 6.  Aortic Atherosclerosis (ICD10-I70.0). Electronically Signed   By: Donnal Fusi M.D.   On: 11/25/2023 10:04   CT Angio Chest PE W and/or Wo Contrast Result Date: 11/25/2023 CLINICAL DATA:  Increased weakness with nausea after cancer treatment. Worsening abdominal pain. Personal history of multiple myeloma. EXAM: CT ANGIOGRAPHY CHEST CT ABDOMEN AND PELVIS WITH CONTRAST TECHNIQUE: Multidetector CT imaging of the chest was performed using the standard protocol during bolus administration of intravenous contrast. Multiplanar CT image reconstructions and MIPs were obtained to evaluate the vascular anatomy. Multidetector CT imaging of the abdomen and pelvis was performed using the standard protocol during bolus administration of intravenous contrast. RADIATION DOSE REDUCTION: This exam was performed according to the departmental dose-optimization program which includes automated exposure control, adjustment of the mA and/or kV according to patient size and/or use of iterative reconstruction technique. CONTRAST:  OMNIPAQUE  IOHEXOL  350 MG/ML SOLN COMPARISON:  PET-CT 09/30/2022. FINDINGS: CTA CHEST FINDINGS Cardiovascular: The heart is enlarged. Coronary artery calcification is evident. Moderate atherosclerotic calcification is noted in the wall of the thoracic aorta. No thoracic aortic aneurysm or dissection. There is no filling defect within the opacified pulmonary arteries to suggest the presence of an acute pulmonary embolus. Right  Port-A-Cath tip is positioned in the distal SVC. Mediastinum/Nodes: No mediastinal lymphadenopathy. There is no hilar lymphadenopathy. The esophagus has normal imaging features. There is no axillary lymphadenopathy. Lungs/Pleura: Dependent atelectasis noted in the lung bases, right greater than left. 2 mm left upper lobe nodule identified on image 35/8. 2 mm left lower lobe nodule identified on 71/8. No pulmonary edema or pleural effusion. Musculoskeletal: No overtly suspicious lytic or sclerotic osseous abnormality in this patient with a reported history of multiple myeloma. Review of the MIP images confirms the above findings. CT ABDOMEN and PELVIS FINDINGS Hepatobiliary: No suspicious focal abnormality within the liver parenchyma. Scattered tiny hypodensities in the liver parenchyma are too small to characterize but are statistically most likely benign. No followup imaging is recommended. There is no evidence for  gallstones, gallbladder wall thickening, or pericholecystic fluid. No intrahepatic or extrahepatic biliary dilation. Pancreas: No focal mass lesion. No dilatation of the main duct. No intraparenchymal cyst. No peripancreatic edema. Spleen: No splenomegaly. No suspicious focal mass lesion. Adrenals/Urinary Tract: No adrenal nodule or mass. Kidneys unremarkable. No evidence for hydroureter. The urinary bladder appears normal for the degree of distention. Stomach/Bowel: Stomach is unremarkable. No gastric wall thickening. No evidence of outlet obstruction. Duodenum is normally positioned as is the ligament of Treitz. No small bowel wall thickening. No small bowel dilatation. The terminal ileum is normal. The appendix is normal. No gross colonic mass. No colonic wall thickening. Vascular/Lymphatic: There is advanced atherosclerotic calcification of the abdominal aorta without aneurysm. There is no gastrohepatic or hepatoduodenal ligament lymphadenopathy. No retroperitoneal or mesenteric lymphadenopathy. No  pelvic sidewall lymphadenopathy. Reproductive: Calcified uterine fibroids evident. Endometrial stripe appears thickened at 13 mm on sagittal imaging. There is no adnexal mass. Other: No intraperitoneal free fluid. Musculoskeletal: No worrisome lytic or sclerotic osseous abnormality. Degenerative changes noted lumbar spine. Review of the MIP images confirms the above findings. IMPRESSION: 1. No CT evidence for acute pulmonary embolus. 2. No acute findings in the abdomen or pelvis. Specifically, no findings to explain the patient's history of abdominal pain. 3. Endometrial stripe appears thickened at 13 mm on sagittal imaging. Correlation for abnormal vaginal bleeding recommended. Follow-up pelvic ultrasound recommended to further evaluate as endometrial neoplasm could have this appearance. 4. Calcified uterine fibroids. 5. Two 2 mm left lung nodules. No follow-up needed if patient is low-risk (and has no known or suspected primary neoplasm). Non-contrast chest CT can be considered in 12 months if patient is high-risk. This recommendation follows the consensus statement: Guidelines for Management of Incidental Pulmonary Nodules Detected on CT Images: From the Fleischner Society 2017; Radiology 2017; 284:228-243. 6.  Aortic Atherosclerosis (ICD10-I70.0). Electronically Signed   By: Donnal Fusi M.D.   On: 11/25/2023 10:04   CT HEAD WO CONTRAST ( ) Result Date: 11/25/2023 CLINICAL DATA:  73 year old female with headache and fever. Recent cancer treatment. Multiple myeloma. EXAM: CT HEAD WITHOUT CONTRAST TECHNIQUE: Contiguous axial images were obtained from the base of the skull through the vertex without intravenous contrast. RADIATION DOSE REDUCTION: This exam was performed according to the departmental dose-optimization program which includes automated exposure control, adjustment of the mA and/or kV according to patient size and/or use of iterative reconstruction technique. COMPARISON:  Head CT 05/26/2021.  FINDINGS: Brain: Cerebral volume is stable, normal for age. No midline shift, ventriculomegaly, mass effect, evidence of mass lesion, intracranial hemorrhage or evidence of cortically based acute infarction. Stable bilateral basal ganglia vascular calcifications. Confluent and extensive bilateral cerebral white matter hypodensity is chronic but progressed since 2022, fairly symmetric. No suspicion of vasogenic edema at this time. Vascular: No suspicious intracranial vascular hyperdensity. Calcified atherosclerosis at the skull base. Skull: Stable. No acute or suspicious osseous abnormality identified. Sinuses/Orbits: Subtotal opacification of the left tympanic cavity and mastoids, new since 2022. Other Visualized paranasal sinuses and mastoids are stable and well aerated. Other: No acute orbit or scalp soft tissue finding. Negative visible nasopharynx. IMPRESSION: 1. Left middle ear and mastoid opacification is new since 2022. Consider with Acute Otitis Media with mastoid effusion. 2. 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 Electronically Signed   By: Marlise Simpers M.D.   On: 11/25/2023 09:30    EKG: Independently reviewed.  Sinus tachycardia, flattened T waves on  multiple leads probably nonspecific.  Assessment/Plan Principal Problem:   Sepsis (HCC) Active Problems:   Sepsis due to cellulitis [facility])  (please populate well all problems here in Problem List. (For example, if patient is on BP meds at home and you resume or decide to hold them, it is a problem that needs to be her. Same for CAD, COPD, HLD and so on)  Sepsis without acute endorgan damage - Sepsis evidenced by leukopenia, hypotension and elevated lactic acid level, source of infection is left upper thigh cellulitis/abscess with spontaneous drainage - MRSA screening, continue vancomycin , add Ancef  - Continue IV fluid resuscitation, echocardiogram  showed normal LVEF recently.  Hypokalemia - Secondary to intractable nauseous vomiting likely from chemotherapy - P.o. replacement - Check magnesium  level  Thrombocytopenia - Likely sec to sepsis and chemotherapy - Trend CBC  HTN - Hold off Coreg  spironolactone  and Lasix   History of asthma/COPD - No acute concern  History of multiple myeloma - Relapsed, continue chemotherapy and outpatient follow-up this oncology  DVT prophylaxis: Lovenox  Code Status: Full code Family Communication: None at bedside Disposition Plan: Patient sick with sepsis requiring IV fluid resuscitation and IV antibiotics, expect more than 2 midnight hospital stay Consults called: None Admission status: PCU admit   [place] MD [facility] Pager [phone]  11/25/2023, 12:31 PM

## 2023-11-25 NOTE — ED Notes (Signed)
 RN to bedside to introduce self to pt. Pt is resting. Pt is high falls risk with no precautions in place. This RN placed all fall precautions. Pt is alert and oriented and in no distress and no complaints at this time.

## 2023-11-25 NOTE — ED Notes (Signed)
 Dr. Raeann Buhl made aware of patient low BP and ordered midodrine  PO

## 2023-11-25 NOTE — ED Triage Notes (Addendum)
 BIB ems for increased weakness and nausea after cancer treatment on Tuesday.  Per pt abd pain worse this morning.   Last BM about an hr pta Denies vomiting or diarrhea   Per ems pt has weeping wound to back of left thigh, per pt "it's been there a couple of days" unsure what caused it  Pt a&ox4, nadn

## 2023-11-25 NOTE — Consult Note (Signed)
 WOC Nurse Consult Note: Reason for Consult: left leg wound Wound type: no wound in images Redness; MD notes lesion left upper thigh, unsure if envenomation  Pressure Injury POA: NA Measurement: see nursing flow sheets Wound WUJ:[id number] Drainage (amount, consistency, odor) see nursing flow sheets Periwound: see nursing flow sheet Dressing procedure/placement/frequency: Cover with foam if draining. Change every other day   Re consult if needed, will not follow at this time. Thanks  Marwan Lipe [employer], RN,CWOCN, CNS, CWON-AP [phone])

## 2023-11-25 NOTE — Consult Note (Signed)
 CODE SEPSIS - PHARMACY COMMUNICATION  **Broad Spectrum Antibiotics should be administered within 1 hour of Sepsis diagnosis**  Time Code Sepsis Called/Page Received: 1610  Antibiotics Ordered: Zosyn  and vancomycin   Time of 1st antibiotic administration: 0916  Additional action taken by pharmacy: N/A  Ramonita Burow ,PharmD Clinical Pharmacist  11/25/2023  8:19 AM

## 2023-11-25 NOTE — Progress Notes (Signed)
 Elink following for sepsis protocol.

## 2023-11-25 NOTE — ED Notes (Signed)
 Patient back from CT.

## 2023-11-26 DIAGNOSIS — J189 Pneumonia, unspecified organism: Secondary | ICD-10-CM

## 2023-11-26 DIAGNOSIS — L03116 Cellulitis of left lower limb: Secondary | ICD-10-CM | POA: Diagnosis not present

## 2023-11-26 DIAGNOSIS — A419 Sepsis, unspecified organism: Secondary | ICD-10-CM | POA: Diagnosis not present

## 2023-11-26 DIAGNOSIS — R6520 Severe sepsis without septic shock: Secondary | ICD-10-CM

## 2023-11-26 LAB — GLUCOSE, CAPILLARY
Glucose-Capillary: 64 mg/dL — ABNORMAL LOW (ref 70–99)
Glucose-Capillary: 64 mg/dL — ABNORMAL LOW (ref 70–99)
Glucose-Capillary: 67 mg/dL — ABNORMAL LOW (ref 70–99)
Glucose-Capillary: 79 mg/dL (ref 70–99)
Glucose-Capillary: 92 mg/dL (ref 70–99)

## 2023-11-26 LAB — CBC
HCT: 26.7 % — ABNORMAL LOW (ref 36.0–46.0)
Hemoglobin: 8.5 g/dL — ABNORMAL LOW (ref 12.0–15.0)
MCH: 27.9 pg (ref 26.0–34.0)
MCHC: 31.8 g/dL (ref 30.0–36.0)
MCV: 87.5 fL (ref 80.0–100.0)
Platelets: 22 10*3/uL — CL (ref 150–400)
RBC: 3.05 MIL/uL — ABNORMAL LOW (ref 3.87–5.11)
RDW: 17.8 % — ABNORMAL HIGH (ref 11.5–15.5)
WBC: 9.7 10*3/uL (ref 4.0–10.5)
nRBC: 0.3 % — ABNORMAL HIGH (ref 0.0–0.2)

## 2023-11-26 LAB — BASIC METABOLIC PANEL WITH GFR
Anion gap: 7 (ref 5–15)
BUN: 25 mg/dL — ABNORMAL HIGH (ref 8–23)
CO2: 22 mmol/L (ref 22–32)
Calcium: 6.5 mg/dL — ABNORMAL LOW (ref 8.9–10.3)
Chloride: 109 mmol/L (ref 98–111)
Creatinine, Ser: 0.9 mg/dL (ref 0.44–1.00)
GFR, Estimated: >60 mL/min (ref >60)
Glucose, Bld: 91 mg/dL (ref 70–99)
Potassium: 3.1 mmol/L — ABNORMAL LOW (ref 3.5–5.1)
Sodium: 138 mmol/L (ref 135–145)

## 2023-11-26 LAB — CBC WITH DIFFERENTIAL/PLATELET
Abs Immature Granulocytes: 0.4 10*3/uL — ABNORMAL HIGH (ref 0.00–0.07)
Basophils Absolute: 0.1 10*3/uL (ref 0.0–0.1)
Basophils Relative: 1 %
Eosinophils Absolute: 0 10*3/uL (ref 0.0–0.5)
Eosinophils Relative: 0 %
HCT: 30.4 % — ABNORMAL LOW (ref 36.0–46.0)
Hemoglobin: 9.5 g/dL — ABNORMAL LOW (ref 12.0–15.0)
Immature Granulocytes: 4 %
Lymphocytes Relative: 4 %
Lymphs Abs: 0.4 10*3/uL — ABNORMAL LOW (ref 0.7–4.0)
MCH: 27.4 pg (ref 26.0–34.0)
MCHC: 31.3 g/dL (ref 30.0–36.0)
MCV: 87.6 fL (ref 80.0–100.0)
Monocytes Absolute: 0.4 10*3/uL (ref 0.1–1.0)
Monocytes Relative: 4 %
Neutro Abs: 9.4 10*3/uL — ABNORMAL HIGH (ref 1.7–7.7)
Neutrophils Relative %: 87 %
Platelets: 27 10*3/uL — CL (ref 150–400)
RBC: 3.47 MIL/uL — ABNORMAL LOW (ref 3.87–5.11)
RDW: 17.7 % — ABNORMAL HIGH (ref 11.5–15.5)
Smear Review: NORMAL
WBC: 10.7 10*3/uL — ABNORMAL HIGH (ref 4.0–10.5)
nRBC: 0.2 % (ref 0.0–0.2)

## 2023-11-26 LAB — BLOOD GAS, VENOUS
Acid-base deficit: 8.8 mmol/L — ABNORMAL HIGH (ref 0.0–2.0)
Bicarbonate: 15.6 mmol/L — ABNORMAL LOW (ref 20.0–28.0)
O2 Saturation: 94.5 %
Patient temperature: 37
pCO2, Ven: 29 mmHg — ABNORMAL LOW (ref 44–60)
pH, Ven: 7.34 (ref 7.25–7.43)
pO2, Ven: 67 mmHg — ABNORMAL HIGH (ref 32–45)

## 2023-11-26 LAB — MAGNESIUM
Magnesium: 1.8 mg/dL (ref 1.7–2.4)
Magnesium: 2.1 mg/dL (ref 1.7–2.4)

## 2023-11-26 LAB — COMPREHENSIVE METABOLIC PANEL WITH GFR
ALT: 9 U/L (ref 0–44)
AST: 12 U/L — ABNORMAL LOW (ref 15–41)
Albumin: 2.7 g/dL — ABNORMAL LOW (ref 3.5–5.0)
Alkaline Phosphatase: 44 U/L (ref 38–126)
Anion gap: 8 (ref 5–15)
BUN: 24 mg/dL — ABNORMAL HIGH (ref 8–23)
CO2: 16 mmol/L — ABNORMAL LOW (ref 22–32)
Calcium: 6.1 mg/dL — CL (ref 8.9–10.3)
Chloride: 113 mmol/L — ABNORMAL HIGH (ref 98–111)
Creatinine, Ser: 0.99 mg/dL (ref 0.44–1.00)
GFR, Estimated: >60 mL/min (ref >60)
Glucose, Bld: 62 mg/dL — ABNORMAL LOW (ref 70–99)
Potassium: 5.1 mmol/L (ref 3.5–5.1)
Sodium: 137 mmol/L (ref 135–145)
Total Bilirubin: 0.9 mg/dL (ref 0.0–1.2)
Total Protein: 4.8 g/dL — ABNORMAL LOW (ref 6.5–8.1)

## 2023-11-26 LAB — CBG MONITORING, ED
Glucose-Capillary: 67 mg/dL — ABNORMAL LOW (ref 70–99)
Glucose-Capillary: 71 mg/dL (ref 70–99)

## 2023-11-26 LAB — PHOSPHORUS: Phosphorus: 2.9 mg/dL (ref 2.5–4.6)

## 2023-11-26 LAB — LACTIC ACID, PLASMA: Lactic Acid, Venous: 2.2 mmol/L (ref 0.5–1.9)

## 2023-11-26 MED ORDER — DEXTROSE 50 % IV SOLN
INTRAVENOUS | Status: AC
Start: 2023-11-26 — End: 2023-11-26
  Filled 2023-11-26: qty 50

## 2023-11-26 MED ORDER — SODIUM BICARBONATE 8.4 % IV SOLN
INTRAVENOUS | Status: DC
  Filled 2023-11-26 (×5): qty 1000

## 2023-11-26 MED ORDER — CALCIUM GLUCONATE-NACL 2-0.675 GM/100ML-% IV SOLN
2.0000 g | Freq: Once | INTRAVENOUS | Status: AC
  Administered 2023-11-26: 2000 mg via INTRAVENOUS
  Filled 2023-11-26: qty 100

## 2023-11-26 MED ORDER — DEXTROSE 50 % IV SOLN
12.5000 g | Freq: Once | INTRAVENOUS | Status: AC
  Administered 2023-11-26: 12.5 g via INTRAVENOUS
  Filled 2023-11-26: qty 50

## 2023-11-26 MED ORDER — MIDODRINE HCL 5 MG PO TABS
10.0000 mg | ORAL_TABLET | Freq: Once | ORAL | Status: AC
  Administered 2023-11-26: 10 mg via ORAL
  Filled 2023-11-26: qty 2

## 2023-11-26 MED ORDER — SODIUM CHLORIDE 0.9 % IV BOLUS
1000.0000 mL | Freq: Once | INTRAVENOUS | Status: AC
  Administered 2023-11-26: 1000 mL via INTRAVENOUS

## 2023-11-26 MED ORDER — POTASSIUM CHLORIDE CRYS ER 20 MEQ PO TBCR
40.0000 meq | EXTENDED_RELEASE_TABLET | Freq: Once | ORAL | Status: AC
  Administered 2023-11-26: 40 meq via ORAL
  Filled 2023-11-26: qty 2

## 2023-11-26 MED ORDER — SODIUM BICARBONATE 8.4 % IV SOLN
50.0000 meq | Freq: Once | INTRAVENOUS | Status: AC
  Administered 2023-11-26: 50 meq via INTRAVENOUS
  Filled 2023-11-26: qty 50

## 2023-11-26 MED ORDER — SODIUM BICARBONATE 8.4 % IV SOLN
INTRAVENOUS | Status: DC
  Filled 2023-11-26: qty 1000
  Filled 2023-11-26: qty 150

## 2023-11-26 MED ORDER — AZITHROMYCIN 250 MG PO TABS
500.0000 mg | ORAL_TABLET | Freq: Every day | ORAL | Status: AC
  Administered 2023-11-26 – 2023-11-30 (×5): 500 mg via ORAL
  Filled 2023-11-26 (×2): qty 2
  Filled 2023-11-26: qty 1
  Filled 2023-11-26 (×2): qty 2

## 2023-11-26 MED ORDER — DEXTROSE 50 % IV SOLN
1.0000 | Freq: Once | INTRAVENOUS | Status: AC
  Administered 2023-11-26: 50 mL via INTRAVENOUS

## 2023-11-26 NOTE — Consult Note (Signed)
 Pharmacy Consult Note - Electrolytes  Sodium (mmol/L)  Date Value  11/26/2023 137  01/25/2020 141   Potassium (mmol/L)  Date Value  11/26/2023 5.1   Magnesium  (mg/dL)  Date Value  66/44/0347 2.1   Calcium  (mg/dL)  Date Value  42/59/5638 6.1 (LL)   Albumin (g/dL)  Date Value  75/64/3329 2.7 (L)   Phosphorus (mg/dL)  Date Value  51/88/4166 2.9   Corrected Ca: 7.1 mg/dL    (Ca 6.1  Alb 2.7)  ASSESSMENT: 73 y.o. female with PMH including multiple myeloma who last received chemotherapy on 4/30 presents with sepsis, intractable nauseous vomiting likely from chemotherapy .  Hx: chronic HFpEF, HTN, hypothyroidism .  Pharmacy has been consulted to monitor and replace electrolytes. Patient's renal function is currently stable.  Lab Results  Component Value Date   CREATININE 0.99 11/26/2023   CREATININE 0.91 11/25/2023   CREATININE 0.61 11/22/2023    mIVF: sodium bicarb @ 125 ml/hr Diet: heart Pertinent medications: N/A  Goal of Therapy:  Electrolytes WNL   PLAN: 5/3 0018 Corrected Calcium = 7.1    NP ordered Calcium  gluconate 2 gm IV x1 F/u BMP @ 1200 Check other electrolytes with next AM labs   Thank you for allowing pharmacy to be a part of this patient's care.  Thomasine Flick PharmD Clinical Pharmacist 11/26/2023

## 2023-11-26 NOTE — ED Notes (Signed)
 Delay in ancef  due to pharmacy. Pharmacy messaged.

## 2023-11-26 NOTE — Significant Event (Addendum)
 CROSS COVER NOTE  NAME: Brandi [name] MRN: [id number] DOB : 1951-01-29 ATTENDING PHYSICIAN: [place], MD    Date of Service   11/26/2023   HPI/Events of Note   Message received from RN, ongoing soft blood pressures Patient admitted with cellulitis left leg, CT without findings of abscess or concerns for fourniers   Interventions   Assessment/Plan: Vitals:   11/26/23 0445 11/26/23 0446 11/26/23 0500 11/26/23 0515  BP: (!) 86/60  (!) 90/51 (!) 86/62  Pulse: 72  73 74  Temp:  98.2 F (36.8 C)    Resp: 17  17 17   Height:      Weight:      SpO2: 100%  100% 100%  TempSrc:  Oral    BMI (Calculated):        Patient aler t and oriented,  Endorses diarrhea and nausea, no chest pain or SOB SR on monitor   Latest Reference Range & Units 11/26/23 00:09  pH, Ven 7.25 - 7.43  7.34  pCO2, Ven 44 - 60 mmHg 29 (L)  pO2, Ven 32 - 45 mmHg 67 (H)  Acid-base deficit 0.0 - 2.0 mmol/L 8.8 (H)  Bicarbonate 20.0 - 28.0 mmol/L 15.6 (L)  O2 Saturation % 94.5  Patient temperature  37.0  Collection site  VEIN  (L): Data is abnormally low (H): Data is abnormally high   Latest Reference Range & Units 11/26/23 00:18  COMPREHENSIVE METABOLIC PANEL WITH GFR  Rpt !!  Sodium 135 - 145 mmol/L 137  Potassium 3.5 - 5.1 mmol/L 5.1  Chloride 98 - 111 mmol/L 113 (H)  CO2 22 - 32 mmol/L 16 (L)  Glucose 70 - 99 mg/dL 62 (L)  BUN 8 - 23 mg/dL 24 (H)  Creatinine 7.82 - 1.00 mg/dL 9.56  Calcium  8.9 - 10.3 mg/dL 6.1 (LL)  Anion gap 5 - 15  8  Magnesium  1.7 - 2.4 mg/dL 1.8  Alkaline Phosphatase 38 - 126 U/L 44  Albumin 3.5 - 5.0 g/dL 2.7 (L)  AST 15 - 41 U/L 12 (L)  ALT 0 - 44 U/L 9  Total Protein 6.5 - 8.1 g/dL 4.8 (L)  Total Bilirubin 0.0 - 1.2 mg/dL 0.9  GFR, Estimated >21 mL/min >60  !!: Data is critical (LL): Data is critically low (H): Data is abnormally high (L): Data is abnormally low Rpt: View report in Results Review for more information  Latest Reference Range & Units  11/26/23 01:36  WBC 4.0 - 10.5 K/uL 10.7 (H)  RBC 3.87 - 5.11 MIL/uL 3.47 (L)  Hemoglobin 12.0 - 15.0 g/dL 9.5 (L)  HCT 30.8 - 65.7 % 30.4 (L)  MCV 80.0 - 100.0 fL 87.6  MCH 26.0 - 34.0 pg 27.4  MCHC 30.0 - 36.0 g/dL 84.6  RDW 96.2 - 95.2 % 17.7 (H)  Platelets 150 - 400 K/uL 27 (LL)  nRBC 0.0 - 0.2 % 0.2  Neutrophils % 87  Lymphocytes % 4  Monocytes Relative % 4  Eosinophil % 0  Basophil % 1  Immature Granulocytes % 4  NEUT# 1.7 - 7.7 K/uL 9.4 (H)  Lymphs Abs 0.7 - 4.0 K/uL 0.4 (L)  Monocyte # 0.1 - 1.0 K/uL 0.4  Eosinophils Absolute 0.0 - 0.5 K/uL 0.0  Basophils Absolute 0.0 - 0.1 K/uL 0.1  Abs Immature Granulocytes 0.00 - 0.07 K/uL 0.40 (H)  Burr Cells  PRESENT  Ovalocytes  PRESENT  WBC Morphology  MORPHOLOGY UNREMARKABLE  (LL): Data is critically low (  H): Data is abnormally high (L): Data is abnormally low  Metabolic acidosis with bicarb deficit  -1 amp bicarb IVP and started on d5 with 3 amps bicarb at 125 for 8 hours  Hypocalcemia -2 amps calcium  gluconate  Acute on chronic thrombocytopenia drop 63 to 27  Likely diluted, no evidence of bleed - monitor  Hypotension resolved        Kip Peon NP [facility] Cross Cover 7pm-7am - check amion for availability Pager [phone]

## 2023-11-26 NOTE — Progress Notes (Signed)
 Progress Note   Patient: Brandi [name] ZOX:[id number] DOB: Dec 16, 1950 DOA: 11/25/2023     1 DOS: the patient was seen and examined on 11/26/2023   Brief hospital course:  73 y.o. female with medical history significant of MM on chemotherapy, chronic HFpEF, HTN, hypothyroidism, presented with GI symptoms including nauseous vomiting abdominal pain.  She also complains about worsening of left leg swelling pain and drainage.   Assessment and Plan:  Severe sepsis - Hypotension, tachycardia, elevated lactate 2.2 on presentation with known abscess given concern for severe sepsis.  Requiring multiple IV fluid boluses to maintain MAP greater than 65.  No vasopressor medications required at this time.  Blood pressure appears to be stable.  Continue to monitor closely.  Blood cultures drawn.  Broad-spectrum antibiotic coverage on board.  Left upper thigh cellulitis - CT scan noting mostly medial left thigh subcutaneous edema, but no walled off fluid collection to indicate abscess.  Likely etiology of patient's septic appearance.  Broad-spectrum antibiotic coverage on board with cefazolin  1 g every 8 hours as well as vancomycin .  Possible pneumonia - Patient with course cough and purulent sputum production this morning.  Given concern for pneumonia.  Interestingly CT performed 5/1 showing no acute findings in the chest.  Will order sputum, strep pneumo/Legionella urine antigens, rapid viral swabs.  Multiple myeloma (POA) - Relapse.  Receiving chemotherapy in outpatient setting.  Follows up with oncology in outpatient setting.  Likely immunocompromise secondary to chemotherapy.  Hypokalemia - Showing improvement after replenishment.  Thrombocytopenia - Likely multifactorial given chemotherapy and sepsis.  Will repeat CBC in AM.  Hypertension - Holding antihypertensives given concern for sepsis.  Asthma/COPD - No active wheezing appreciated.  Nebulizers on board.     Subjective: Patient  resting, this morning.  States she feels much better than when she came in.  Complaining of coarse cough with thick purulent sputum production.  Left upper thigh pain and tenderness with redness as well.  Denies shortness of breath, chest pain, nausea, vomiting, abdominal pain.  Blood pressure appears improved after IV fluid resuscitation.  Physical Exam: Vitals:   11/26/23 1032 11/26/23 1130 11/26/23 1200 11/26/23 1230  BP:  (!) 106/57 104/76 (!) 97/58  Pulse:  89 86 88  Resp:    17  Temp: 97.7 F (36.5 C)     TempSrc: Oral     SpO2:  100% 100% 100%  Weight:      Height:        GENERAL:  Alert, pleasant, no acute distress, disheveled HEENT:  EOMI CARDIOVASCULAR:  RRR, no murmurs appreciated RESPIRATORY: Coarse cough, purulent sputum production, bilateral rhonchi GASTROINTESTINAL:  Soft, nontender, nondistended EXTREMITIES:  No LE edema bilaterally NEURO:  No new focal deficits appreciated SKIN:  No rashes noted PSYCH:  Appropriate mood and affect     Data Reviewed:  No new imaging reviewed  Labs: CBC: Recent Labs  Lab 11/22/23 0910 11/25/23 0703 11/26/23 0136 11/26/23 0520  WBC 4.5 3.5* 10.7* 9.7  NEUTROABS 2.4 2.4 9.4*  --   HGB 10.7* 11.6* 9.5* 8.5*  HCT 33.7* 36.3 30.4* 26.7*  MCV 86.6 87.1 87.6 87.5  PLT 110* 63* 27* 22*   Basic Metabolic Panel: Recent Labs  Lab 11/22/23 0910 11/25/23 0703 11/25/23 0929 11/25/23 2004 11/26/23 0018 11/26/23 0520  NA 140 136  --   --  137  --   K 3.1* 2.4*  --  3.6 5.1  --   CL 111 105  --   --  113*  --   CO2 21* 18*  --   --  16*  --   GLUCOSE 110* 90  --   --  62*  --   BUN 13 19  --   --  24*  --   CREATININE 0.61 0.91  --   --  0.99  --   CALCIUM  8.0* 7.7*  --   --  6.1*  --   MG  --  1.5*  --   --  1.8 2.1  PHOS  --   --  2.2*  --   --  2.9   Liver Function Tests: Recent Labs  Lab 11/22/23 0910 11/25/23 0703 11/26/23 0018  AST 13* 18 12*  ALT 8 10 9   ALKPHOS 71 64 44  BILITOT 0.5 1.5* 0.9  PROT  6.4* 6.6 4.8*  ALBUMIN 3.8 3.8 2.7*   CBG: Recent Labs  Lab 11/26/23 0741 11/26/23 1137  GLUCAP 67* 71    Scheduled Meds:  aspirin  EC  81 mg Oral Daily   brinzolamide   1 drop Both Eyes TID   And   brimonidine   1 drop Both Eyes TID   enoxaparin  (LOVENOX ) injection  40 mg Subcutaneous Q24H   levothyroxine   175 mcg Oral Daily   midodrine   5 mg Oral TID WC   montelukast   10 mg Oral QHS   pantoprazole   40 mg Oral Daily   Continuous Infusions:   ceFAZolin  (ANCEF ) IV Stopped (11/26/23 1032)   sodium bicarbonate 150 mEq in dextrose  5 % 1,150 mL infusion Stopped (11/26/23 1412)   vancomycin  Stopped (11/26/23 1224)   PRN Meds:.albuterol , ondansetron  **OR** ondansetron  (ZOFRAN ) IV, polyethylene glycol, traMADol   Family Communication: None at bedside  Disposition: Status is: Inpatient Remains inpatient appropriate because: Sepsis with left leg cellulitis and possible pneumonia     Time spent: 51 minutes  Author: Jodeane Mulligan, DO 11/26/2023 2:34 PM  For on call review [email or web address].

## 2023-11-26 NOTE — ED Notes (Signed)
 Pt had large BM, purewick removed. Brief, bedding and gown changed. Pt turned in bed at this time

## 2023-11-26 NOTE — ED Notes (Signed)
 Pt pressure continues to be low after fluids and midodrine . House NP notified.

## 2023-11-26 NOTE — ED Notes (Signed)
 BGL was 67, given OJ this AM and amp of D50. MD notified via secure chat

## 2023-11-26 NOTE — Consult Note (Signed)
 Pharmacy Consult Note - Electrolytes  Sodium (mmol/L)  Date Value  11/26/2023 138  01/25/2020 141   Potassium (mmol/L)  Date Value  11/26/2023 3.1 (L)   Magnesium  (mg/dL)  Date Value  42/59/5638 2.1   Calcium  (mg/dL)  Date Value  75/64/3329 6.5 (L)   Albumin (g/dL)  Date Value  51/88/4166 2.7 (L)   Phosphorus (mg/dL)  Date Value  01/23/1600 2.9   Corrected Ca: 7.5 mg/dL    (Ca 6.5  Alb 2.7)  ASSESSMENT: 73 y.o. female with PMH including multiple myeloma who last received chemotherapy on 4/30 presents with sepsis, intractable nauseous vomiting likely from chemotherapy .  Hx: chronic HFpEF, HTN, hypothyroidism .  Pharmacy has been consulted to monitor and replace electrolytes. Patient's renal function is currently stable.  Lab Results  Component Value Date   CREATININE 0.90 11/26/2023   CREATININE 0.99 11/26/2023   CREATININE 0.91 11/25/2023    mIVF: sodium bicarb @ 125 ml/hr Diet: heart Pertinent medications: N/A  Goal of Therapy:  Electrolytes WNL   PLAN: 5/3 @1412 :  Corrected Calcium = 7.5    Will order Calcium  gluconate 2 gm IV x1 K 3.1     Will order KCL 40 meq po x 1 Check other electrolytes with next AM labs   Thank you for allowing pharmacy to be a part of this patient's care.  Thomasine Flick PharmD Clinical Pharmacist 11/26/2023

## 2023-11-26 NOTE — ED Notes (Addendum)
 This RN and Tech cleaned pt. Pt had loose stool. Pt has large wound with redness and heat to left inner thigh and is very painful. Appears to be "dimpling" up the posteriod side of her left thigh.

## 2023-11-26 NOTE — Hospital Course (Signed)
 73 y.o. female with medical history significant of MM on chemotherapy, chronic HFpEF, HTN, hypothyroidism, presented with GI symptoms including nauseous vomiting abdominal pain.  She also complains about worsening of left leg swelling pain and drainage.    Assessment and Plan:   Severe sepsis - Hypotension, tachycardia, elevated lactate 2.2 on presentation with known abscess given concern for severe sepsis.  Requiring multiple IV fluid boluses to maintain MAP greater than 65.  No vasopressor medications required at this time.  Blood pressure appears to be stable.  Continue to monitor closely.  Blood cultures drawn.  Broad-spectrum antibiotic coverage on board.   Left upper thigh cellulitis - CT scan noting mostly medial left thigh subcutaneous edema, but no walled off fluid collection to indicate abscess.  Likely etiology of patient's septic appearance.  Broad-spectrum antibiotic coverage on board with cefazolin  1 g every 8 hours as well as vancomycin .   Possible pneumonia - Patient with course cough and purulent sputum production this morning.  Given concern for pneumonia.  Interestingly CT performed 5/1 showing no acute findings in the chest.  Will order sputum, strep pneumo/Legionella urine antigens, rapid viral swabs.   Multiple myeloma (POA) - Relapse.  Receiving chemotherapy in outpatient setting.  Follows up with oncology in outpatient setting.  Likely immunocompromise secondary to chemotherapy.   Hypokalemia - Showing improvement after replenishment.   Thrombocytopenia - Likely multifactorial given chemotherapy and sepsis.  Will repeat CBC in AM.   Hypertension - Holding antihypertensives given concern for sepsis.   Asthma/COPD - No active wheezing appreciated.  Nebulizers on board.

## 2023-11-27 DIAGNOSIS — L0390 Cellulitis, unspecified: Secondary | ICD-10-CM

## 2023-11-27 DIAGNOSIS — A419 Sepsis, unspecified organism: Secondary | ICD-10-CM | POA: Diagnosis not present

## 2023-11-27 DIAGNOSIS — J189 Pneumonia, unspecified organism: Secondary | ICD-10-CM | POA: Diagnosis not present

## 2023-11-27 LAB — CBC
HCT: 24.3 % — ABNORMAL LOW (ref 36.0–46.0)
Hemoglobin: 8 g/dL — ABNORMAL LOW (ref 12.0–15.0)
MCH: 28.1 pg (ref 26.0–34.0)
MCHC: 32.9 g/dL (ref 30.0–36.0)
MCV: 85.3 fL (ref 80.0–100.0)
Platelets: 32 10*3/uL — ABNORMAL LOW (ref 150–400)
RBC: 2.85 MIL/uL — ABNORMAL LOW (ref 3.87–5.11)
RDW: 18 % — ABNORMAL HIGH (ref 11.5–15.5)
WBC: 10.7 10*3/uL — ABNORMAL HIGH (ref 4.0–10.5)
nRBC: 0.4 % — ABNORMAL HIGH (ref 0.0–0.2)

## 2023-11-27 LAB — BASIC METABOLIC PANEL WITH GFR
Anion gap: 9 (ref 5–15)
BUN: 23 mg/dL (ref 8–23)
CO2: 22 mmol/L (ref 22–32)
Calcium: 7.2 mg/dL — ABNORMAL LOW (ref 8.9–10.3)
Chloride: 109 mmol/L (ref 98–111)
Creatinine, Ser: 0.82 mg/dL (ref 0.44–1.00)
GFR, Estimated: >60 mL/min (ref >60)
Glucose, Bld: 70 mg/dL (ref 70–99)
Potassium: 3.7 mmol/L (ref 3.5–5.1)
Sodium: 140 mmol/L (ref 135–145)

## 2023-11-27 LAB — GLUCOSE, CAPILLARY
Glucose-Capillary: 74 mg/dL (ref 70–99)
Glucose-Capillary: 83 mg/dL (ref 70–99)
Glucose-Capillary: 82 mg/dL (ref 70–99)
Glucose-Capillary: 77 mg/dL (ref 70–99)
Glucose-Capillary: 88 mg/dL (ref 70–99)

## 2023-11-27 LAB — EXPECTORATED SPUTUM ASSESSMENT W GRAM STAIN, RFLX TO RESP C

## 2023-11-27 LAB — STREP PNEUMONIAE URINARY ANTIGEN: Strep Pneumo Urinary Antigen: NEGATIVE

## 2023-11-27 LAB — MAGNESIUM: Magnesium: 2.2 mg/dL (ref 1.7–2.4)

## 2023-11-27 MED ORDER — SODIUM CHLORIDE 0.9% FLUSH: 10.0000 mL | INTRAVENOUS | Status: DC | PRN

## 2023-11-27 MED ORDER — GUAIFENESIN-DM 100-10 MG/5ML PO SYRP
5.0000 mL | ORAL_SOLUTION | ORAL | Status: DC | PRN
  Administered 2023-11-27 – 2023-12-05 (×10): 5 mL via ORAL
  Filled 2023-11-27 (×11): qty 10

## 2023-11-27 MED ORDER — CHLORHEXIDINE GLUCONATE CLOTH 2 % EX PADS
6.0000 | MEDICATED_PAD | Freq: Every day | CUTANEOUS | Status: DC
  Administered 2023-11-27 – 2023-12-01 (×5): 6 via TOPICAL

## 2023-11-27 MED ORDER — SODIUM CHLORIDE 0.9 % IV SOLN
2.0000 g | INTRAVENOUS | Status: DC
  Administered 2023-11-27 – 2023-11-30 (×4): 2 g via INTRAVENOUS
  Filled 2023-11-27 (×4): qty 20

## 2023-11-27 MED ORDER — BLISTEX MEDICATED EX OINT
1.0000 | TOPICAL_OINTMENT | CUTANEOUS | Status: DC | PRN
  Administered 2023-11-27: 1 via TOPICAL
  Filled 2023-11-27: qty 6.3

## 2023-11-27 NOTE — Plan of Care (Signed)

## 2023-11-27 NOTE — Progress Notes (Signed)
 Progress Note   Patient: Brandi [name] MWU:[id number] DOB: Feb 11, 1951 DOA: 11/25/2023     2 DOS: the patient was seen and examined on 11/27/2023   Brief hospital course:  73 y.o. female with medical history significant of MM on chemotherapy, chronic HFpEF, HTN, hypothyroidism, presented with GI symptoms including nauseous vomiting abdominal pain.  She also complains about worsening of left leg swelling pain and drainage.    Assessment and Plan:   Severe sepsis - Hypotension, tachycardia, elevated lactate 2.2 on presentation with known abscess given concern for severe sepsis.  Requiring multiple IV fluid boluses to maintain MAP greater than 65.  No vasopressor medications required at this time.  Blood pressure appears to be improving.  Continue to monitor closely.  Blood cultures drawn.  Broad-spectrum antibiotic coverage on board.   Left upper thigh cellulitis - CT scan noting mostly medial left thigh subcutaneous edema, but no walled off fluid collection to indicate abscess.  Likely etiology of patient's septic appearance.  Broad-spectrum antibiotic coverage on board initially cefazolin  1 g every 8 hours as well as vancomycin .  Transitioning to ceftriaxone  2 g every 24 hours secondary to pneumonia coverage.  Small weeping area with induration, daily dressing change.  Will order wound care consult.   Possible pneumonia - Patient with course cough and purulent sputum production this morning.  Given concern for pneumonia.  Interestingly CT performed 5/1 showing no acute findings in the chest.  Pending sputum culture.  Strep pneumo urine antigen negative.  Ceftriaxone  2 g every 24 hours plus azithromycin  500 mg p.o. daily on board.  Nebulizers.  Supplemental O2 as needed.   Multiple myeloma (POA) - Relapse.  Receiving chemotherapy in outpatient setting.  Follows up with oncology in outpatient setting.  Likely immunocompromise secondary to chemotherapy.   Hypokalemia - Showing improvement after  replenishment.  Will recheck BMP and mag in AM.   Thrombocytopenia - Likely multifactorial given chemotherapy and sepsis.  Showing improvement.  Will repeat CBC in AM.   Hypertension - Holding antihypertensives given concern for sepsis.   Asthma/COPD - No active wheezing appreciated.  Nebulizers on board.   Subjective: Patient resting comfortably this morning.  Feeling improved from yesterday.  Still having significant left medial/posterior thigh pain and tenderness.  Also continues to have coarse productive cough.  Denies fever, nausea, vomiting, chest pain, abdominal pain.  Physical Exam: Vitals:   11/27/23 0000 11/27/23 0612 11/27/23 0838 11/27/23 1220  BP: 118/62 (!) 124/106 122/77 118/72  Pulse: 81 78 81 76  Resp:  18 20 (!) 21  Temp: 98.6 F (37 C) 98.7 F (37.1 C) 97.8 F (36.6 C) 98.6 F (37 C)  TempSrc: Oral Oral Oral Oral  SpO2: 98% 100% 98% 100%  Weight:      Height:        GENERAL:  Alert, pleasant, no acute distress HEENT:  EOMI CARDIOVASCULAR:  RRR, no murmurs appreciated RESPIRATORY: Coarse cough, purulent sputum production, bilateral rhonchi GASTROINTESTINAL:  Soft, nontender, nondistended EXTREMITIES:  No LE edema bilaterally NEURO:  No new focal deficits appreciated SKIN: Medial/posterior left thigh indurated with erythema and small weeping blister, markedly tender PSYCH:  Appropriate mood and affect    Data Reviewed:  No new images reviewed  Labs: CBC: Recent Labs  Lab 11/22/23 0910 11/25/23 0703 11/26/23 0136 11/26/23 0520 11/27/23 0525  WBC 4.5 3.5* 10.7* 9.7 10.7*  NEUTROABS 2.4 2.4 9.4*  --   --   HGB 10.7* 11.6* 9.5* 8.5* 8.0*  HCT 33.7* 36.3  30.4* 26.7* 24.3*  MCV 86.6 87.1 87.6 87.5 85.3  PLT 110* 63* 27* 22* 32*   Basic Metabolic Panel: Recent Labs  Lab 11/22/23 0910 11/25/23 0703 11/25/23 0929 11/25/23 2004 11/26/23 0018 11/26/23 0520 11/26/23 1412 11/27/23 0525  NA 140 136  --   --  137  --  138 140  K 3.1* 2.4*   --  3.6 5.1  --  3.1* 3.7  CL 111 105  --   --  113*  --  109 109  CO2 21* 18*  --   --  16*  --  22 22  GLUCOSE 110* 90  --   --  62*  --  91 70  BUN 13 19  --   --  24*  --  25* 23  CREATININE 0.61 0.91  --   --  0.99  --  0.90 0.82  CALCIUM  8.0* 7.7*  --   --  6.1*  --  6.5* 7.2*  MG  --  1.5*  --   --  1.8 2.1  --  2.2  PHOS  --   --  2.2*  --   --  2.9  --   --    Liver Function Tests: Recent Labs  Lab 11/22/23 0910 11/25/23 0703 11/26/23 0018  AST 13* 18 12*  ALT 8 10 9   ALKPHOS 71 64 44  BILITOT 0.5 1.5* 0.9  PROT 6.4* 6.6 4.8*  ALBUMIN 3.8 3.8 2.7*   CBG: Recent Labs  Lab 11/26/23 2032 11/26/23 2356 11/27/23 0351 11/27/23 0836 11/27/23 1220  GLUCAP 67* 79 74 83 82    Scheduled Meds:  aspirin  EC  81 mg Oral Daily   azithromycin   500 mg Oral Daily   brinzolamide   1 drop Both Eyes TID   And   brimonidine   1 drop Both Eyes TID   Chlorhexidine  Gluconate Cloth  6 each Topical Daily   enoxaparin  (LOVENOX ) injection  40 mg Subcutaneous Q24H   levothyroxine   175 mcg Oral Daily   midodrine   5 mg Oral TID WC   montelukast   10 mg Oral QHS   pantoprazole   40 mg Oral Daily   Continuous Infusions:  cefTRIAXone  (ROCEPHIN )  IV 2 g (11/27/23 1008)   vancomycin  1,000 mg (11/27/23 1022)   PRN Meds:.albuterol , guaiFENesin -dextromethorphan, lip balm, ondansetron  **OR** ondansetron  (ZOFRAN ) IV, polyethylene glycol, sodium chloride  flush, traMADol   Family Communication: None at bedside  Disposition: Status is: Inpatient Remains inpatient appropriate because: Sepsis with cellulitis and pneumonia     Time spent: 39 minutes  Author: Jodeane Mulligan, DO 11/27/2023 2:24 PM  For on call review [email or web address].

## 2023-11-27 NOTE — Consult Note (Signed)
 Pharmacy Consult Note - Electrolytes  Sodium (mmol/L)  Date Value  11/27/2023 140  01/25/2020 141   Potassium (mmol/L)  Date Value  11/27/2023 3.7   Magnesium  (mg/dL)  Date Value  16/04/9603 2.2   Calcium  (mg/dL)  Date Value  54/03/8118 7.2 (L)   Albumin (g/dL)  Date Value  14/78/2956 2.7 (L)   Phosphorus (mg/dL)  Date Value  21/30/8657 2.9     Corrected Ca:   8.2 mg/dL    (Ca 7.2  Alb 2.7)  ASSESSMENT: 73 y.o. female with PMH including multiple myeloma who last received chemotherapy on 4/30 presents with sepsis, intractable nauseous vomiting likely from chemotherapy .  Hx: chronic HFpEF, HTN, hypothyroidism .  Pharmacy has been consulted to monitor and replace electrolytes. Patient's renal function is currently stable.  Lab Results  Component Value Date   CREATININE 0.82 11/27/2023   CREATININE 0.90 11/26/2023   CREATININE 0.99 11/26/2023    mIVF: none Diet: heart Pertinent medications: N/A  Goal of Therapy:  Electrolytes WNL   PLAN:  No electrolyte replacement at this time Check other electrolytes with next AM labs   Thank you for allowing pharmacy to be a part of this patient's care.  Thomasine Flick PharmD Clinical Pharmacist 11/27/2023

## 2023-11-28 ENCOUNTER — Inpatient Hospital Stay

## 2023-11-28 ENCOUNTER — Inpatient Hospital Stay: Admitting: Internal Medicine

## 2023-11-28 DIAGNOSIS — A419 Sepsis, unspecified organism: Secondary | ICD-10-CM | POA: Diagnosis not present

## 2023-11-28 DIAGNOSIS — J189 Pneumonia, unspecified organism: Secondary | ICD-10-CM | POA: Diagnosis not present

## 2023-11-28 DIAGNOSIS — L0390 Cellulitis, unspecified: Secondary | ICD-10-CM | POA: Diagnosis not present

## 2023-11-28 DIAGNOSIS — E162 Hypoglycemia, unspecified: Secondary | ICD-10-CM

## 2023-11-28 LAB — CBC
HCT: 26.1 % — ABNORMAL LOW (ref 36.0–46.0)
Hemoglobin: 8.4 g/dL — ABNORMAL LOW (ref 12.0–15.0)
MCH: 27.7 pg (ref 26.0–34.0)
MCHC: 32.2 g/dL (ref 30.0–36.0)
MCV: 86.1 fL (ref 80.0–100.0)
Platelets: 78 10*3/uL — ABNORMAL LOW (ref 150–400)
RBC: 3.03 MIL/uL — ABNORMAL LOW (ref 3.87–5.11)
RDW: 17.9 % — ABNORMAL HIGH (ref 11.5–15.5)
WBC: 6.5 10*3/uL (ref 4.0–10.5)
nRBC: 0.5 % — ABNORMAL HIGH (ref 0.0–0.2)

## 2023-11-28 LAB — GLUCOSE, CAPILLARY
Glucose-Capillary: 68 mg/dL — ABNORMAL LOW (ref 70–99)
Glucose-Capillary: 124 mg/dL — ABNORMAL HIGH (ref 70–99)
Glucose-Capillary: 86 mg/dL (ref 70–99)
Glucose-Capillary: 92 mg/dL (ref 70–99)
Glucose-Capillary: 84 mg/dL (ref 70–99)

## 2023-11-28 LAB — RENAL FUNCTION PANEL
Albumin: 2.8 g/dL — ABNORMAL LOW (ref 3.5–5.0)
Anion gap: 13 (ref 5–15)
BUN: 16 mg/dL (ref 8–23)
CO2: 20 mmol/L — ABNORMAL LOW (ref 22–32)
Calcium: 7.3 mg/dL — ABNORMAL LOW (ref 8.9–10.3)
Chloride: 105 mmol/L (ref 98–111)
Creatinine, Ser: 0.7 mg/dL (ref 0.44–1.00)
GFR, Estimated: >60 mL/min (ref >60)
Glucose, Bld: 66 mg/dL — ABNORMAL LOW (ref 70–99)
Phosphorus: 2.9 mg/dL (ref 2.5–4.6)
Potassium: 3.6 mmol/L (ref 3.5–5.1)
Sodium: 138 mmol/L (ref 135–145)

## 2023-11-28 LAB — MAGNESIUM: Magnesium: 2.2 mg/dL (ref 1.7–2.4)

## 2023-11-28 MED ORDER — MIDODRINE HCL 5 MG PO TABS
2.5000 mg | ORAL_TABLET | Freq: Three times a day (TID) | ORAL | Status: AC
Start: 2023-11-28 — End: 2023-11-29
  Administered 2023-11-28 – 2023-11-29 (×3): 2.5 mg via ORAL
  Filled 2023-11-28 (×3): qty 1

## 2023-11-28 NOTE — Evaluation (Signed)
 Physical Therapy Evaluation Patient Details Name: Brandi [name] MRN: [id number] DOB: 1951/06/24 Today's Date: 11/28/2023  History of Present Illness  Pt is a 73 y.o. female presenting to hospital 11/25/23 with c/o weakness, nausea, generalized abdominal pain; also L upper thigh pain.  Pt admitted with sepsis, L upper thigh cellulitis/abscess with spontaneous drainage, hypokalemia, thrombocyptopenia.  PMH includes multiple myeloma on chemotherapy, chronic HFpEF, htn.  Clinical Impression  PT/OT co-evaluation performed.  Prior to recent medical concerns, pt reports being independent with ambulation; lives with her daughter (who provides assist as needed) and grandson in 1 level home with 3 STE B railings; no recent falls reported; pt's assist levels vary depending on her fatigue levels with chemo treatment.  Of note, pt reports h/o hot flashes and when this happens she will pass out.  Currently pt is mod assist semi-supine to sitting EOB; mod assist x2 1st trial standing (pt appearing hesitant to WB through L LE d/t L thigh pain); CGA x2 to stand up 2nd trial up to RW; and CGA x2 to take steps bed to recliner with RW use (limited d/t pt fatigue).  Pt would currently benefit from skilled PT to address noted impairments and functional limitations (see below for any additional details).  Upon hospital discharge, pt would benefit from ongoing therapy.  Anticipate pt may need to utilize manual w/c initially upon hospital discharge d/t impaired strength, endurance, and impaired functional mobility.    If plan is discharge home, recommend the following: A little help with walking and/or transfers;A little help with bathing/dressing/bathroom;Assistance with cooking/housework;Assist for transportation;Help with stairs or ramp for entrance   Can travel by private vehicle        Equipment Recommendations Other (comment) (Pt has RW, BSC, and manual w/c at home already)  Recommendations for Other Services        Functional Status Assessment Patient has had a recent decline in their functional status and demonstrates the ability to make significant improvements in function in a reasonable and predictable amount of time.     Precautions / Restrictions Precautions Precautions: Fall Precaution/Restrictions Comments: pt states she gets "hot flashes" and will "pass out" at home Restrictions Weight Bearing Restrictions Per Provider Order: No      Mobility  Bed Mobility Overal bed mobility: Needs Assistance Bed Mobility: Supine to Sit     Supine to sit: Mod assist, HOB elevated     General bed mobility comments: assist for trunk and scooting to EOB    Transfers Overall transfer level: Needs assistance Equipment used: Rolling walker (2 wheels) Transfers: Sit to/from Stand, Bed to chair/wheelchair/BSC Sit to Stand: Mod assist, Contact guard assist, +2 physical assistance   Step pivot transfers: Contact guard assist, +2 safety/equipment, +2 physical assistance       General transfer comment: Mod assist x2 to stand from bed 1st trial (pt hesitant to WB through L LE d/t painful L thigh); 2nd standing attempt CGA x2; able to perform stand step turn bed to recliner with RW CGA x2; vc's for UE placement during transfers    Ambulation/Gait               General Gait Details: Deferred (pt reports not being able to do more activity at this time)  Careers information officer     Tilt Bed    Modified Rankin (Stroke Patients Only)       Balance Overall balance assessment: Needs assistance Sitting-balance  support: Feet supported, Single extremity supported Sitting balance-Leahy Scale: Good Sitting balance - Comments: steady static sitting   Standing balance support: Bilateral upper extremity supported, Reliant on assistive device for balance Standing balance-Leahy Scale: Fair Standing balance comment: steady static standing with B UE support on RW                              Pertinent Vitals/Pain Pain Assessment Pain Assessment: 0-10 Pain Score: 4  Pain Location: L thigh Pain Descriptors / Indicators: Aching, Discomfort, Dull Pain Intervention(s): Limited activity within patient's tolerance, Monitored during session, Premedicated before session, Repositioned    Home Living Family/patient expects to be discharged to:: Private residence Living Arrangements: Children (daughter and grandson) Available Help at Discharge: Family Type of Home: House Home Access: Stairs to enter Entrance Stairs-Rails: Right;Left;Can reach both Entrance Stairs-Number of Steps: 3   Home Layout: One level Home Equipment: Grab bars - tub/shower;Grab bars - toilet;Rolling Walker (2 wheels);Wheelchair - manual;Cane - quad;BSC/3in1;Shower seat Additional Comments: Pt's daughter able to assist pt as needed.    Prior Function Prior Level of Function : Independent/Modified Independent             Mobility Comments: Independent with ambulation; no recent falls reported ADLs Comments: Pt's daughter assists with IADLs/ADLs as needed based on fatigue levels with chemo tx     Extremity/Trunk Assessment   Upper Extremity Assessment Upper Extremity Assessment: Generalized weakness    Lower Extremity Assessment Lower Extremity Assessment: Generalized weakness       Communication   Communication Communication: No apparent difficulties    Cognition Arousal: Alert Behavior During Therapy: WFL for tasks assessed/performed   PT - Cognitive impairments: No apparent impairments                         Following commands: Intact       Cueing Cueing Techniques: Verbal cues, Gestural cues, Tactile cues     General Comments General comments (skin integrity, edema, etc.): RN present end of session to check L LE cellulitis and address purewick.  Pt keeping nasal cannula close by but did not use during session (96% or greater on room air).  Pt's  friend present during session.    Exercises     Assessment/Plan    PT Assessment Patient needs continued PT services  PT Problem List Decreased strength;Decreased activity tolerance;Decreased balance;Decreased mobility;Decreased skin integrity;Pain       PT Treatment Interventions DME instruction;Gait training;Stair training;Functional mobility training;Therapeutic activities;Therapeutic exercise;Balance training;Patient/family education    PT Goals (Current goals can be found in the Care Plan section)  Acute Rehab PT Goals Patient Stated Goal: to improve strength and functional mobility PT Goal Formulation: With patient Time For Goal Achievement: 12/12/23 Potential to Achieve Goals: Good    Frequency Min 2X/week     Co-evaluation PT/OT/SLP Co-Evaluation/Treatment: Yes Reason for Co-Treatment: For patient/therapist safety;To address functional/ADL transfers PT goals addressed during session: Mobility/safety with mobility;Balance;Proper use of DME OT goals addressed during session: Proper use of Adaptive equipment and DME;ADL's and self-care       AM-PAC PT "6 Clicks" Mobility  Outcome Measure Help needed turning from your back to your side while in a flat bed without using bedrails?: None Help needed moving from lying on your back to sitting on the side of a flat bed without using bedrails?: A Lot Help needed moving to and from a bed to a  chair (including a wheelchair)?: A Little Help needed standing up from a chair using your arms (e.g., wheelchair or bedside chair)?: A Little Help needed to walk in hospital room?: A Lot Help needed climbing 3-5 steps with a railing? : A Lot 6 Click Score: 16    End of Session   Activity Tolerance: Patient limited by fatigue Patient left: in chair;with call bell/phone within reach;with chair alarm set;with family/visitor present Nurse Communication: Mobility status;Precautions;Other (comment) (pt's SpO2 sats during session) PT Visit  Diagnosis: Other abnormalities of gait and mobility (R26.89);Muscle weakness (generalized) (M62.81);Pain Pain - Right/Left: Left Pain - part of body: Hip    Time: 8295-6213 PT Time Calculation (min) (ACUTE ONLY): 34 min   Charges:   PT Evaluation $PT Eval Low Complexity: 1 Low   PT General Charges $$ ACUTE PT VISIT: 1 Visit        Amador Junes, PT 11/28/23, 2:07 PM

## 2023-11-28 NOTE — Consult Note (Addendum)
 Pharmacy Antibiotic Note  ASSESSMENT: 73 y.o. female with PMH including multiple myeloma currently on chemotherapy, HFpEF, HTN, hypothyroidism is presenting with sepsis in the setting of cellulitis/wound infection. CT shows edema and swelling in left thigh area without evidence of abscess. Pharmacy has been consulted to manage vancomycin  dosing. Patient also receiving ceftriaxone .  11/28/23: -Patient is afebrile, SBP 110-120, WBC is trending from 10.7 >> 6.5, Scr 0.70 (baseline 0.60-0.70). Past cultures wound cultures show growth of S. Pneumoniae sensitive to PCN. -Messaged Dr. Macarthur Savory to follow-up on duration of therapy. Planning for 5 days of vancomycin  therapy then re-assess for an abscess, if no abscess, planning on DC vancomycin .  Patient measurements: Height: 5\' 2"  (157.5 cm) Weight: 71.2 kg (157 lb) IBW/kg (Calculated) : 50.1  Vital signs: Temp: 97.9 F (36.6 C) (05/05 0756) Temp Source: Oral (05/05 0522) BP: 147/85 (05/05 0756) Pulse Rate: 70 (05/05 0756) Recent Labs  Lab 11/26/23 0520 11/26/23 1412 11/27/23 0525 11/28/23 0545  WBC 9.7  --  10.7* 6.5  CREATININE  --  0.90 0.82 0.70   Estimated Creatinine Clearance: 59.6 mL/min (by C-G formula based on SCr of 0.7 mg/dL).  Allergies: No Known Allergies  Antimicrobials this admission:  Vancomycin  5/2 >> Ceftriaxone  5/2 >> Zosyn  5/2 x 1 Cefazolin  5/2 > 5/4   Dose adjustments this admission: N/A  Microbiology results: 5/2 Bcx: NGTDx3d 5/2 GAS PCR: not detected 5/2 MRSA PCR: not detected  PLAN: Continue vancomycin  1000mg  IV q24H eAUC 417, Cmax 28.9, Cmin 9.9 Scr 0.80 (rounded up from 0.70), IBW, Vd 0.72 L/kg Continue ceftriaxone  2g IV q24h If continuing vancomycin  tomorrow, will order peak/trough with next dose. Follow up culture results to assess for antibiotic optimization. Monitor renal function to assess for any necessary antibiotic dosing changes.   Thank you for allowing pharmacy to be a part of this  patient's care.  Ara Knee, PharmD Candidate 11/28/2023 8:39 AM

## 2023-11-28 NOTE — Progress Notes (Signed)
 Progress Note   Patient: Brandi [name] WUJ:[id number] DOB: February 15, 1951 DOA: 11/25/2023     3 DOS: the patient was seen and examined on 11/28/2023   Brief hospital course:  73 y.o. female with medical history significant of MM on chemotherapy, chronic HFpEF, HTN, hypothyroidism, presented with GI symptoms including nauseous vomiting abdominal pain.  She also complains about worsening of left leg swelling pain and drainage.    Assessment and Plan:   Severe sepsis - Hypotension, tachycardia, elevated lactate 2.2 on presentation with known abscess given concern for severe sepsis.  Requiring multiple IV fluid boluses to maintain MAP greater than 65.  No vasopressor medications required at this time.  Blood pressure appears to be improving.  Continue to monitor closely.  Blood cultures drawn.  Broad-spectrum antibiotic coverage on board.  Appears to be resolving.  Weaning back midodrine  but was initiated on presentation.   Left upper thigh cellulitis - CT scan noting mostly medial left thigh subcutaneous edema, but no walled off fluid collection to indicate abscess.  Likely etiology of patient's septic appearance.  Broad-spectrum antibiotic coverage on board initially cefazolin  and vancomycin .  Transitioning to ceftriaxone  2 g every 24 hours secondary to pneumonia coverage on 5/4.  Small weeping area with induration, daily dressing change.  Will order wound care consult.   Possible pneumonia - Patient with course cough and purulent sputum production this morning.  Given concern for pneumonia.  Interestingly CT performed 5/1 showing no acute findings in the chest.  Pending sputum culture.  Strep pneumo urine antigen negative.  Ceftriaxone  2 g every 24 hours plus azithromycin  500 mg p.o. daily on board.  Nebulizers.  Supplemental O2 as needed.  No hypoxia.  Showing improvement.   Multiple myeloma (POA) - Relapse.  Receiving chemotherapy in outpatient setting.  Follows up with oncology in outpatient  setting.  Likely immunocompromise secondary to chemotherapy.   Hypokalemia - Showing improvement after replenishment.  Will recheck BMP and mag in AM.   Thrombocytopenia - Likely multifactorial given chemotherapy and sepsis.  Showing improvement.  Will repeat CBC in AM.   Hypertension - Holding antihypertensives given concern for sepsis.   Asthma/COPD - No active wheezing appreciated.  Nebulizers on board.  Hypoglycemia - Noted in the last 2 mornings.  Patient asymptomatic this morning.  Was able to drink juice with resolution.  Continue to monitor closely.  Likely exacerbated by infection.   Subjective: Patient feeling improved this morning.  Afebrile, cough improved.  Eager to work with physical therapy later this morning.  Denies any fever, chest pain, nausea, vomiting, abdominal pain.  Still has pain in her left upper thigh.  Physical Exam: Vitals:   11/28/23 0014 11/28/23 0522 11/28/23 0756 11/28/23 1210  BP: 123/73 134/87 (!) 147/85 131/79  Pulse: 78 79 70 78  Resp:  18    Temp: 99 F (37.2 C) 98.2 F (36.8 C) 97.9 F (36.6 C) 98.3 F (36.8 C)  TempSrc: Oral Oral    SpO2: 100% 99% 100% 99%  Weight:      Height:        GENERAL:  Alert, pleasant, no acute distress HEENT:  EOMI CARDIOVASCULAR:  RRR, no murmurs appreciated RESPIRATORY: Improved cough, bilateral rhonchi GASTROINTESTINAL:  Soft, nontender, nondistended EXTREMITIES:  No LE edema bilaterally NEURO:  No new focal deficits appreciated SKIN: Medial/posterior left thigh indurated with erythema and small weeping blister, markedly tender PSYCH:  Appropriate mood and affect    Data Reviewed:  No new imaging to review  Labs: CBC: Recent Labs  Lab 11/22/23 0910 11/25/23 0703 11/26/23 0136 11/26/23 0520 11/27/23 0525 11/28/23 0545  WBC 4.5 3.5* 10.7* 9.7 10.7* 6.5  NEUTROABS 2.4 2.4 9.4*  --   --   --   HGB 10.7* 11.6* 9.5* 8.5* 8.0* 8.4*  HCT 33.7* 36.3 30.4* 26.7* 24.3* 26.1*  MCV 86.6 87.1  87.6 87.5 85.3 86.1  PLT 110* 63* 27* 22* 32* 78*   Basic Metabolic Panel: Recent Labs  Lab 11/25/23 0703 11/25/23 0929 11/25/23 2004 11/26/23 0018 11/26/23 0520 11/26/23 1412 11/27/23 0525 11/28/23 0545  NA 136  --   --  137  --  138 140 138  K 2.4*  --  3.6 5.1  --  3.1* 3.7 3.6  CL 105  --   --  113*  --  109 109 105  CO2 18*  --   --  16*  --  22 22 20*  GLUCOSE 90  --   --  62*  --  91 70 66*  BUN 19  --   --  24*  --  25* 23 16  CREATININE 0.91  --   --  0.99  --  0.90 0.82 0.70  CALCIUM  7.7*  --   --  6.1*  --  6.5* 7.2* 7.3*  MG 1.5*  --   --  1.8 2.1  --  2.2 2.2  PHOS  --  2.2*  --   --  2.9  --   --  2.9   Liver Function Tests: Recent Labs  Lab 11/22/23 0910 11/25/23 0703 11/26/23 0018 11/28/23 0545  AST 13* 18 12*  --   ALT 8 10 9   --   ALKPHOS 71 64 44  --   BILITOT 0.5 1.5* 0.9  --   PROT 6.4* 6.6 4.8*  --   ALBUMIN 3.8 3.8 2.7* 2.8*   CBG: Recent Labs  Lab 11/27/23 1626 11/27/23 2048 11/28/23 0754 11/28/23 1043 11/28/23 1214  GLUCAP 77 88 68* 124* 86    Scheduled Meds:  aspirin  EC  81 mg Oral Daily   azithromycin   500 mg Oral Daily   Chlorhexidine  Gluconate Cloth  6 each Topical Daily   enoxaparin  (LOVENOX ) injection  40 mg Subcutaneous Q24H   levothyroxine   175 mcg Oral Daily   midodrine   2.5 mg Oral TID WC   montelukast   10 mg Oral QHS   pantoprazole   40 mg Oral Daily   Continuous Infusions:  cefTRIAXone  (ROCEPHIN )  IV 2 g (11/28/23 0805)   vancomycin  1,000 mg (11/28/23 0941)   PRN Meds:.albuterol , guaiFENesin -dextromethorphan, lip balm, ondansetron  **OR** ondansetron  (ZOFRAN ) IV, polyethylene glycol, sodium chloride  flush, traMADol   Family Communication: Family at bedside  Disposition: Status is: Inpatient Remains inpatient appropriate because: Sepsis with cellulitis and pneumonia     Time spent: 35 minutes  Author: Jodeane Mulligan, DO 11/28/2023 12:53 PM  For on call review [email or web address].

## 2023-11-28 NOTE — Consult Note (Signed)
 Pharmacy Consult Note - Electrolytes  Sodium (mmol/L)  Date Value  11/28/2023 138  01/25/2020 141   Potassium (mmol/L)  Date Value  11/28/2023 3.6   Magnesium  (mg/dL)  Date Value  40/04/2724 2.2   Calcium  (mg/dL)  Date Value  36/64/4034 7.3 (L)   Albumin (g/dL)  Date Value  74/25/9563 2.8 (L)   Phosphorus (mg/dL)  Date Value  87/56/4332 2.9     Corrected Ca:   8.3 mg/dL    (Ca 7.3  Alb 2.8)  ASSESSMENT: 73 y.o. female with PMH including multiple myeloma who last received chemotherapy on 4/30 presents with sepsis, intractable nauseous vomiting likely from chemotherapy .  Hx: chronic HFpEF, HTN, hypothyroidism. Pharmacy has been consulted to monitor and replace electrolytes. Patient's renal function is currently stable.  Lab Results  Component Value Date   CREATININE 0.70 11/28/2023   CREATININE 0.82 11/27/2023   CREATININE 0.90 11/26/2023    mIVF: none Diet: heart Pertinent medications: N/A  Goal of Therapy:  Electrolytes WNL   PLAN:  Electrolytes remain stable. Pharmacy will sign off. Please re-consult if needed.   Thank you for allowing pharmacy to be a part of this patient's care.  Ara Knee, PharmD Candidate 11/28/2023

## 2023-11-28 NOTE — Evaluation (Signed)
 Occupational Therapy Evaluation Patient Details Name: Brandi [name] MRN: [id number] DOB: [birth year] Today's Date: 11/28/2023   History of Present Illness   Pt is a 73 y.o. female admitted with severe sepsis in the setting of L upper thigh cellulitis, GI symptoms including nausea, vomiting and abdominal pain, possible pneumonia, hypokalemia, thrombocytopenia. PMH significant for MM on chemotherapy, chronic HFpEF, HTN, & hypothyroidism     Clinical Impressions Prior to hospital admission, pt was independent for mobility with no falls. Pt lives with family who provide assist for ADLs/IADLs as needed, pt reporting that fatigue levels vary depending on chemo tx. Pt currently requires MAX A for LB ADLs (likely close to baseline), MOD A +1 for bed mobility, CGA +2 for step pivot transfer to recliner. Required MOD A +2 for first STS attempt, found to be incontinent of bowel, MAX A for pericare in standing. Pt would benefit from skilled OT services to address noted impairments and functional limitations (see below for any additional details) in order to maximize safety and independence while minimizing falls risk and caregiver burden. Anticipate the need for follow up [facility] OT services upon acute hospital DC.      If plan is discharge home, recommend the following:   A little help with walking and/or transfers;A little help with bathing/dressing/bathroom;Assistance with cooking/housework;Assist for transportation     Functional Status Assessment   Patient has had a recent decline in their functional status and demonstrates the ability to make significant improvements in function in a reasonable and predictable amount of time.     Equipment Recommendations   None recommended by OT      Precautions/Restrictions   Precautions Precautions: Fall Precaution/Restrictions Comments: pt states she gets "hot flashes" and will "pass out" at home Restrictions Weight Bearing Restrictions Per  Provider Order: No     Mobility Bed Mobility Overal bed mobility: Needs Assistance Bed Mobility: Supine to Sit     Supine to sit: Mod assist, HOB elevated          Transfers Overall transfer level: Needs assistance Equipment used: Rolling walker (2 wheels) Transfers: Sit to/from Stand, Bed to chair/wheelchair/BSC Sit to Stand: Mod assist, +2 physical assistance     Step pivot transfers: Contact guard assist, +2 safety/equipment, +2 physical assistance     General transfer comment: first attempt to stand from EOB MOD A +2 (pt hesitant to weight bear on L side due to painful L thigh). second STS attempt with CGA +2, able to perform step pivot transfer with RW with CGA +2. Cues for hand placement to rise from bed      Balance Overall balance assessment: Needs assistance Sitting-balance support: Feet supported, Single extremity supported Sitting balance-Leahy Scale: Good     Standing balance support: No upper extremity supported, Reliant on assistive device for balance Standing balance-Leahy Scale: Fair                             ADL either performed or assessed with clinical judgement   ADL Overall ADL's : Needs assistance/impaired                     Lower Body Dressing: Maximal assistance;Sit to/from stand Lower Body Dressing Details (indicate cue type and reason): pt unable to reach both feet, max A to thread over feet and hips. Toilet Transfer: Librarian, academic for safety/equipment;Ambulation;Rolling walker (2 wheels) Toilet Transfer Details (indicate cue type and reason): clinical judgement;  simulated to recliner Toileting- Clothing Manipulation and Hygiene: Maximal assistance;+2 for safety/equipment;Sit to/from stand Toileting - Clothing Manipulation Details (indicate cue type and reason): pt incontinent of bowel and bladder (purewick). max A for pericare in standing     Functional mobility during ADLs: Contact guard assist;+2 for  safety/equipment;Rolling walker (2 wheels) General ADL Comments: Pt seems close to functional baseline, family assists with ADLs at home.      Pertinent Vitals/Pain Pain Assessment Pain Assessment: 0-10 Pain Score: 4  Pain Descriptors / Indicators: Aching, Discomfort, Dull Pain Intervention(s): Limited activity within patient's tolerance, Monitored during session, Premedicated before session     Extremity/Trunk Assessment Upper Extremity Assessment Upper Extremity Assessment: Generalized weakness   Lower Extremity Assessment Lower Extremity Assessment: Generalized weakness       Communication Communication Communication: No apparent difficulties   Cognition Arousal: Alert Behavior During Therapy: WFL for tasks assessed/performed                                 Following commands: Intact       Cueing  General Comments   Cueing Techniques: Verbal cues;Gestural cues;Tactile cues  RN present end of session to check L LE cellulitis and address purewick.  Pt keeping nasal cannula close by but did not use during session (96% or greater on room air).           Home Living Family/patient expects to be discharged to:: Private residence Living Arrangements: Children (daughter and grandson) Available Help at Discharge: Family Type of Home: House Home Access: Stairs to enter Secretary/administrator of Steps: 3 Entrance Stairs-Rails: Can reach both Home Layout: One level     Bathroom Shower/Tub: Tub/shower unit;Sponge bathes at baseline   [employer]: Handicapped height     Home Equipment: Grab bars - tub/shower;Grab bars - toilet;Rolling Walker (2 wheels);Wheelchair - manual;Cane - quad;BSC/3in1;Shower seat   Additional Comments: Pt's daughter able to assist pt as needed.      Prior Functioning/Environment Prior Level of Function : Independent/Modified Independent             Mobility Comments: independent, no falls ADLs Comments: daughter  assists with IADLs/ADLs as needed based on fatigue levels with chemo tx    OT Problem List: Decreased strength;Decreased activity tolerance;Impaired balance (sitting and/or standing);Pain;Decreased knowledge of precautions;Decreased knowledge of use of DME or AE   OT Treatment/Interventions: Self-care/ADL training;Therapeutic exercise;Neuromuscular education;Energy conservation;DME and/or AE instruction;Therapeutic activities;Patient/family education;Balance training      OT Goals(Current goals can be found in the care plan section)   Acute Rehab OT Goals OT Goal Formulation: With patient Time For Goal Achievement: 12/12/23 Potential to Achieve Goals: Good   OT Frequency:  Min 2X/week    Co-evaluation PT/OT/SLP Co-Evaluation/Treatment: Yes Reason for Co-Treatment: For patient/therapist safety;To address functional/ADL transfers PT goals addressed during session: Mobility/safety with mobility;Balance OT goals addressed during session: Proper use of Adaptive equipment and DME;ADL's and self-care      AM-PAC OT "6 Clicks" Daily Activity     Outcome Measure Help from another person eating meals?: None Help from another person taking care of personal grooming?: None Help from another person toileting, which includes using toliet, bedpan, or urinal?: A Lot Help from another person bathing (including washing, rinsing, drying)?: A Lot Help from another person to put on and taking off regular upper body clothing?: A Little Help from another person to put on and taking off regular lower body clothing?:  A Lot 6 Click Score: 17   End of Session Equipment Utilized During Treatment: Rolling walker (2 wheels);Oxygen Nurse Communication: Mobility status  Activity Tolerance: Patient tolerated treatment well Patient left: in chair;with call bell/phone within reach;with chair alarm set;with family/visitor present  OT Visit Diagnosis: Muscle weakness (generalized) (M62.81);Other abnormalities  of gait and mobility (R26.89);Unsteadiness on feet (R26.81)                Time: 6213-0865 OT Time Calculation (min): 34 min Charges:  OT General Charges $OT Visit: 1 Visit OT Evaluation $OT Eval Low Complexity: 1 Low OT Treatments $Self Care/Home Management : 8-22 mins Cerita Rabelo L. Ann Bohne, OTR/L  11/28/23, 3:26 PM

## 2023-11-28 NOTE — Care Management Important Message (Signed)
 Important Message  Patient Details  Name: Brandi [name] MRN: [id number] Date of Birth: August 18, 1950   Important Message Given:  Yes - Medicare IM     Anise Kerns 11/28/2023, 3:03 PM

## 2023-11-29 ENCOUNTER — Inpatient Hospital Stay

## 2023-11-29 DIAGNOSIS — E876 Hypokalemia: Secondary | ICD-10-CM | POA: Diagnosis not present

## 2023-11-29 DIAGNOSIS — A419 Sepsis, unspecified organism: Secondary | ICD-10-CM | POA: Diagnosis not present

## 2023-11-29 DIAGNOSIS — L03116 Cellulitis of left lower limb: Secondary | ICD-10-CM

## 2023-11-29 DIAGNOSIS — J189 Pneumonia, unspecified organism: Secondary | ICD-10-CM | POA: Diagnosis not present

## 2023-11-29 LAB — BASIC METABOLIC PANEL WITH GFR
Anion gap: 9 (ref 5–15)
BUN: 8 mg/dL (ref 8–23)
CO2: 23 mmol/L (ref 22–32)
Calcium: 7.3 mg/dL — ABNORMAL LOW (ref 8.9–10.3)
Chloride: 107 mmol/L (ref 98–111)
Creatinine, Ser: 0.55 mg/dL (ref 0.44–1.00)
GFR, Estimated: >60 mL/min (ref >60)
Glucose, Bld: 75 mg/dL (ref 70–99)
Potassium: 3.4 mmol/L — ABNORMAL LOW (ref 3.5–5.1)
Sodium: 139 mmol/L (ref 135–145)

## 2023-11-29 LAB — GLUCOSE, CAPILLARY
Glucose-Capillary: 100 mg/dL — ABNORMAL HIGH (ref 70–99)
Glucose-Capillary: 102 mg/dL — ABNORMAL HIGH (ref 70–99)
Glucose-Capillary: 115 mg/dL — ABNORMAL HIGH (ref 70–99)
Glucose-Capillary: 70 mg/dL (ref 70–99)
Glucose-Capillary: 82 mg/dL (ref 70–99)
Glucose-Capillary: 83 mg/dL (ref 70–99)
Glucose-Capillary: 86 mg/dL (ref 70–99)

## 2023-11-29 LAB — CULTURE, RESPIRATORY W GRAM STAIN

## 2023-11-29 LAB — LEGIONELLA PNEUMOPHILA SEROGP 1 UR AG: L. pneumophila Serogp 1 Ur Ag: NEGATIVE

## 2023-11-29 LAB — CBC
HCT: 27.2 % — ABNORMAL LOW (ref 36.0–46.0)
Hemoglobin: 8.8 g/dL — ABNORMAL LOW (ref 12.0–15.0)
MCH: 27.9 pg (ref 26.0–34.0)
MCHC: 32.4 g/dL (ref 30.0–36.0)
MCV: 86.3 fL (ref 80.0–100.0)
Platelets: 136 10*3/uL — ABNORMAL LOW (ref 150–400)
RBC: 3.15 MIL/uL — ABNORMAL LOW (ref 3.87–5.11)
RDW: 17.5 % — ABNORMAL HIGH (ref 11.5–15.5)
WBC: 3.3 10*3/uL — ABNORMAL LOW (ref 4.0–10.5)
nRBC: 0.6 % — ABNORMAL HIGH (ref 0.0–0.2)

## 2023-11-29 LAB — MAGNESIUM: Magnesium: 2.3 mg/dL (ref 1.7–2.4)

## 2023-11-29 MED ORDER — ORAL CARE MOUTH RINSE: 15.0000 mL | OROMUCOSAL | Status: DC | PRN

## 2023-11-29 MED ORDER — IOHEXOL 300 MG/ML  SOLN
100.0000 mL | Freq: Once | INTRAMUSCULAR | Status: AC | PRN
  Administered 2023-11-29: 100 mL via INTRAVENOUS

## 2023-11-29 NOTE — Progress Notes (Signed)
 Progress Note   Patient: Brandi [name] ONG:[id number] DOB: 08-19-1950 DOA: 11/25/2023     4 DOS: the patient was seen and examined on 11/29/2023   Brief hospital course:  73 y.o. female with medical history significant of MM on chemotherapy, chronic HFpEF, HTN, hypothyroidism, presented with GI symptoms including nauseous vomiting abdominal pain.  She also complains about worsening of left leg swelling pain and drainage.    Assessment and Plan:   Severe sepsis - Hypotension, tachycardia, elevated lactate 2.2 on presentation with known abscess given concern for severe sepsis.  Requiring multiple IV fluid boluses to maintain MAP greater than 65.  No vasopressor medications required at this time.  Blood pressure appears to be improving.  Continue to monitor closely.  Blood cultures drawn.  Broad-spectrum antibiotic coverage on board.  Appears to be resolving.  No longer requiring midodrine .  Left upper thigh cellulitis - CT scan on presentation noting mostly medial left thigh subcutaneous edema, but no walled off fluid collection to indicate abscess.  Likely etiology of patient's sepsis.  Broad-spectrum antibiotic coverage on board initially cefazolin  and vancomycin .  Transitioning to ceftriaxone  2 g every 24 hours secondary to pneumonia coverage on 5/4.  Small weeping area with induration, daily dressing change.  Will order wound care consult.  Given continued induration induration and tenderness, will repeat CT of the area to assess for developed underlying abscess.  If abscess is present, will likely need to get general surgery involved for I&D.   Possible pneumonia - Patient with course cough and purulent sputum production this morning.  Given concern for pneumonia.  Interestingly CT performed 5/1 showing no acute findings in the chest.  Pending sputum culture.  Strep pneumo urine antigen negative.  Ceftriaxone  2 g every 24 hours plus azithromycin  500 mg p.o. daily on board.  Nebulizers.   Supplemental O2 as needed.  No hypoxia.  Showing improvement.   Multiple myeloma (POA) - Relapse.  Receiving chemotherapy in outpatient setting.  Follows up with oncology in outpatient setting.  Likely immunocompromise secondary to chemotherapy.   Hypokalemia - Showing improvement after replenishment.  Not quite at goal.  Replenishment ordered again this morning.  Will recheck BMP and mag in AM.   Thrombocytopenia - Likely multifactorial given chemotherapy and sepsis.  Showing improvement.  Will repeat CBC in AM.   Hypertension - Holding antihypertensives given concern for sepsis.  Could consider restarting if blood pressure becomes elevated.   Asthma/COPD - No active wheezing appreciated.  Nebulizers on board.   Hypoglycemia - Previously happened in AM.  Was able to drink juice with resolution.  Continue to monitor closely.  Likely exacerbated by infection.   Subjective: Patient feeling improved this morning.  States she was able to tolerate standing, walking with physical therapy which is an improvement from her presentation.  Still has a lingering cough but denies any fever, purulent sputum, chest pain, shortness of breath, nausea, vomiting, abdominal pain.  Still has frank tenderness, redness, swelling in her medial/posterior left thigh.  Fresh bandage applied this morning.  Physical Exam: Vitals:   11/29/23 0019 11/29/23 0404 11/29/23 0759 11/29/23 1052  BP: (!) 136/95 139/73 (!) 149/83 116/82  Pulse: 73 70 72 72  Resp: 18 20 17 17   Temp: 97.9 F (36.6 C) 98.8 F (37.1 C) 98.6 F (37 C) 98.9 F (37.2 C)  TempSrc: Oral     SpO2: 97% 98% 97% 99%  Weight:      Height:  GENERAL:  Alert, pleasant, no acute distress HEENT:  EOMI CARDIOVASCULAR:  RRR, no murmurs appreciated RESPIRATORY: Improved cough, bilateral rhonchi GASTROINTESTINAL:  Soft, nontender, nondistended EXTREMITIES:  No LE edema bilaterally NEURO:  No new focal deficits appreciated SKIN:  Medial/posterior left thigh indurated with erythema and small weeping blister, markedly tender but improved PSYCH:  Appropriate mood and affect    Data Reviewed:  No new imaging to review  Labs: CBC: Recent Labs  Lab 11/25/23 0703 11/26/23 0136 11/26/23 0520 11/27/23 0525 11/28/23 0545 11/29/23 0458  WBC 3.5* 10.7* 9.7 10.7* 6.5 3.3*  NEUTROABS 2.4 9.4*  --   --   --   --   HGB 11.6* 9.5* 8.5* 8.0* 8.4* 8.8*  HCT 36.3 30.4* 26.7* 24.3* 26.1* 27.2*  MCV 87.1 87.6 87.5 85.3 86.1 86.3  PLT 63* 27* 22* 32* 78* 136*   Basic Metabolic Panel: Recent Labs  Lab 11/25/23 0929 11/25/23 2004 11/26/23 0018 11/26/23 0520 11/26/23 1412 11/27/23 0525 11/28/23 0545 11/29/23 0458  NA  --   --  137  --  138 140 138 139  K  --    < > 5.1  --  3.1* 3.7 3.6 3.4*  CL  --   --  113*  --  109 109 105 107  CO2  --   --  16*  --  22 22 20* 23  GLUCOSE  --   --  62*  --  91 70 66* 75  BUN  --   --  24*  --  25* 23 16 8   CREATININE  --   --  0.99  --  0.90 0.82 0.70 0.55  CALCIUM   --   --  6.1*  --  6.5* 7.2* 7.3* 7.3*  MG  --   --  1.8 2.1  --  2.2 2.2 2.3  PHOS 2.2*  --   --  2.9  --   --  2.9  --    < > = values in this interval not displayed.   Liver Function Tests: Recent Labs  Lab 11/25/23 0703 11/26/23 0018 11/28/23 0545  AST 18 12*  --   ALT 10 9  --   ALKPHOS 64 44  --   BILITOT 1.5* 0.9  --   PROT 6.6 4.8*  --   ALBUMIN 3.8 2.7* 2.8*   CBG: Recent Labs  Lab 11/28/23 2040 11/29/23 0018 11/29/23 0400 11/29/23 0800 11/29/23 1154  GLUCAP 84 83 82 70 102*    Scheduled Meds:  aspirin  EC  81 mg Oral Daily   azithromycin   500 mg Oral Daily   Chlorhexidine  Gluconate Cloth  6 each Topical Daily   enoxaparin  (LOVENOX ) injection  40 mg Subcutaneous Q24H   levothyroxine   175 mcg Oral Daily   montelukast   10 mg Oral QHS   pantoprazole   40 mg Oral Daily   Continuous Infusions:  cefTRIAXone  (ROCEPHIN )  IV 2 g (11/29/23 0823)   PRN Meds:.albuterol ,  guaiFENesin -dextromethorphan, iohexol , lip balm, ondansetron  **OR** ondansetron  (ZOFRAN ) IV, mouth rinse, polyethylene glycol, sodium chloride  flush, traMADol   Family Communication: Family at bedside  Disposition: Status is: Inpatient Remains inpatient appropriate because: Sepsis, left thigh cellulitis, pneumonia     Time spent: 40 minutes  Author: Jodeane Mulligan, DO 11/29/2023 12:23 PM  For on call review [email or web address].

## 2023-11-29 NOTE — Plan of Care (Signed)

## 2023-11-29 NOTE — Plan of Care (Signed)

## 2023-11-30 ENCOUNTER — Encounter: Payer: Self-pay | Admitting: Internal Medicine

## 2023-11-30 ENCOUNTER — Other Ambulatory Visit: Payer: Self-pay

## 2023-11-30 DIAGNOSIS — L0291 Cutaneous abscess, unspecified: Secondary | ICD-10-CM

## 2023-11-30 DIAGNOSIS — I10 Essential (primary) hypertension: Secondary | ICD-10-CM | POA: Diagnosis not present

## 2023-11-30 DIAGNOSIS — C9002 Multiple myeloma in relapse: Secondary | ICD-10-CM | POA: Diagnosis not present

## 2023-11-30 DIAGNOSIS — L0390 Cellulitis, unspecified: Secondary | ICD-10-CM

## 2023-11-30 DIAGNOSIS — I5032 Chronic diastolic (congestive) heart failure: Secondary | ICD-10-CM | POA: Diagnosis not present

## 2023-11-30 DIAGNOSIS — B9562 Methicillin resistant Staphylococcus aureus infection as the cause of diseases classified elsewhere: Secondary | ICD-10-CM

## 2023-11-30 DIAGNOSIS — L02416 Cutaneous abscess of left lower limb: Secondary | ICD-10-CM | POA: Diagnosis not present

## 2023-11-30 DIAGNOSIS — L03116 Cellulitis of left lower limb: Secondary | ICD-10-CM | POA: Diagnosis not present

## 2023-11-30 DIAGNOSIS — A419 Sepsis, unspecified organism: Secondary | ICD-10-CM | POA: Diagnosis not present

## 2023-11-30 HISTORY — DX: Cellulitis, unspecified: L03.90

## 2023-11-30 LAB — BASIC METABOLIC PANEL WITH GFR
Anion gap: 7 (ref 5–15)
BUN: <5 mg/dL — ABNORMAL LOW (ref 8–23)
CO2: 26 mmol/L (ref 22–32)
Calcium: 7.8 mg/dL — ABNORMAL LOW (ref 8.9–10.3)
Chloride: 105 mmol/L (ref 98–111)
Creatinine, Ser: 0.71 mg/dL (ref 0.44–1.00)
GFR, Estimated: >60 mL/min
Glucose, Bld: 83 mg/dL (ref 70–99)
Potassium: 3.5 mmol/L (ref 3.5–5.1)
Sodium: 138 mmol/L (ref 135–145)

## 2023-11-30 LAB — CBC
HCT: 32.1 % — ABNORMAL LOW (ref 36.0–46.0)
Hemoglobin: 10.2 g/dL — ABNORMAL LOW (ref 12.0–15.0)
MCH: 27.3 pg (ref 26.0–34.0)
MCHC: 31.8 g/dL (ref 30.0–36.0)
MCV: 85.8 fL (ref 80.0–100.0)
Platelets: 234 K/uL (ref 150–400)
RBC: 3.74 MIL/uL — ABNORMAL LOW (ref 3.87–5.11)
RDW: 17.7 % — ABNORMAL HIGH (ref 11.5–15.5)
WBC: 4.2 K/uL (ref 4.0–10.5)
nRBC: 0 % (ref 0.0–0.2)

## 2023-11-30 LAB — CULTURE, BLOOD (ROUTINE X 2)
Culture: NO GROWTH
Culture: NO GROWTH
Special Requests: ADEQUATE
Special Requests: ADEQUATE

## 2023-11-30 LAB — GLUCOSE, CAPILLARY
Glucose-Capillary: 87 mg/dL (ref 70–99)
Glucose-Capillary: 92 mg/dL (ref 70–99)
Glucose-Capillary: 90 mg/dL (ref 70–99)
Glucose-Capillary: 96 mg/dL (ref 70–99)

## 2023-11-30 LAB — MAGNESIUM: Magnesium: 2 mg/dL (ref 1.7–2.4)

## 2023-11-30 MED ORDER — LINEZOLID 600 MG PO TABS
600.0000 mg | ORAL_TABLET | Freq: Two times a day (BID) | ORAL | Status: DC
  Administered 2023-11-30 – 2023-12-06 (×12): 600 mg via ORAL
  Filled 2023-11-30 (×12): qty 1

## 2023-11-30 MED ORDER — ENOXAPARIN SODIUM 40 MG/0.4ML IJ SOSY: 40.0000 mg | PREFILLED_SYRINGE | INTRAMUSCULAR | Status: DC

## 2023-11-30 MED ORDER — PIPERACILLIN-TAZOBACTAM 3.375 G IVPB
3.3750 g | Freq: Three times a day (TID) | INTRAVENOUS | Status: DC
  Administered 2023-11-30 – 2023-12-05 (×14): 3.375 g via INTRAVENOUS
  Filled 2023-11-30 (×14): qty 50

## 2023-11-30 MED ORDER — METOPROLOL TARTRATE 5 MG/5ML IV SOLN
5.0000 mg | Freq: Once | INTRAVENOUS | Status: AC
  Administered 2023-11-30: 5 mg via INTRAVENOUS
  Filled 2023-11-30: qty 5

## 2023-11-30 NOTE — Consult Note (Signed)
 NAME: LATIQUA [name]  DOB: [birth year]  MRN: [id number]  Date/Time: 11/30/2023 1:52 PM  REQUESTING PROVIDER: Dr. Farrel Hones Subjective:  REASON FOR CONSULT: Cellulitis left thigh ? Brandi [name] is a 73 y.o. female with a history of history of recurrent multiple myeloma s/p Stem cell transplant 2019, COPD, strep pneumoniae spetic arthritis left knee in 2024,currently on salvage Rx with carfilzomib  and dexamethasone  which she last received on 11/23/23 ollowed by [facility] onc,  hypothyroidism secondary to total thyroidectomy for Graves in 2022, strep pneumo bacteremia and pneumonia is March 2022 presented to the hospital on 11/25/2023 with painful boil on the left thigh which had been present for 2 to 3 days.  She noted whether it was a spider bite. Patient was also having some weakness and some nausea and some vague abdominal pain.  She did not have any fever or chills But she had a lot of warmth in her left thigh Vitals in the ED was BP of 118/68, temperature 98, pulse 116, respiratory 29 and sats of 100%. WBC was 3.5, Hb 11.6, platelets 63 and creatinine of 0.91. Blood culture was sent Patient was also complaining of cough with some productive white sputum. Patient was started on ceftriaxone , azithromycin  and vancomycin .Chest x-ray showed upper zone pulmonary vascular prominence raising the possibility of pulmonary venous hypertension with bandlike atelectasis at the left lung base. As patient continued to have severe left thigh pain a CT of theLeft thigh was obtained yesterday that showed persistent subcutaneous edema and fat stranding extending along the left proximal to mid medial thigh with underlying skin thickening I am asked to see the patient for management of the left thigh condition Past Medical History:  Diagnosis Date   (HFpEF) heart failure with preserved ejection fraction (HCC)    a. 06/2020 Echo: EF 60-65%, no rwma, nl RV size/fxn. Triv MR. Triv TR/AI. Mod elev PASP.   Anxiety    COPD  (chronic obstructive pulmonary disease) (HCC)    GERD (gastroesophageal reflux disease)    Hypertension    Hyperthyroidism    Hypokalemia    IgA myeloma (HCC)    Multiple myeloma (HCC)    Pneumonia    Red blood cell antibody positive, compatible PRBC difficult to obtain    S/P autologous bone marrow transplantation [facility])     Past Surgical History:  Procedure Laterality Date   ANTERIOR VITRECTOMY Left 10/07/2021   Procedure: ANTERIOR VITRECTOMY;  Surgeon: Annell Kidney, MD;  Location: [facility];  Service: Ophthalmology;  Laterality: Left;   CATARACT EXTRACTION W/PHACO Left 10/07/2021   Procedure: CATARACT EXTRACTION PHACO AND INTRAOCULAR LENS PLACEMENT (IOC) LEFT VISION BLUE;  Surgeon: Annell Kidney, MD;  Location: [facility];  Service: Ophthalmology;  Laterality: Left;  kit for manual incision 14.56 01:21.4   CATARACT EXTRACTION W/PHACO Right 10/21/2021   Procedure: CATARACT EXTRACTION PHACO AND INTRAOCULAR LENS PLACEMENT (IOC) RIGHT 11.12 01:48.1;  Surgeon: Annell Kidney, MD;  Location: [facility];  Service: Ophthalmology;  Laterality: Right;   ESOPHAGOGASTRODUODENOSCOPY (EGD) WITH PROPOFOL  N/A 03/30/2019   Procedure: ESOPHAGOGASTRODUODENOSCOPY (EGD) WITH PROPOFOL ;  Surgeon: Luke Salaam, MD;  Location: [facility] ENDOSCOPY;  Service: Gastroenterology;  Laterality: N/A;   IR BONE MARROW BIOPSY & ASPIRATION  05/25/2023   IR FLUORO GUIDE PORT INSERTION RIGHT  12/16/2017   IR IMAGING GUIDED PORT INSERTION  06/15/2021   KNEE ARTHROSCOPY Left 09/03/2022   Procedure: ARTHROSCOPY KNEE DEBRIDEMENT;  Surgeon: Jerlyn Moons, MD;  Location: [facility] ORS;  Service: Orthopedics;  Laterality: Left;  THYROIDECTOMY N/A     Social History   Socioeconomic History   Marital status: Single    Spouse name: Not on file   Number of children: 3   Years of education: Not on file   Highest education level: Not on file  Occupational History   Not on file  Tobacco Use    Smoking status: Former    Current packs/day: 0.00    Types: Cigarettes    Quit date: 03/18/2021    Years since quitting: 2.7   Smokeless tobacco: Never   Tobacco comments:    2 cigarettes QOD  Vaping Use   Vaping status: Never Used  Substance and Sexual Activity   Alcohol use: No   Drug use: No   Sexual activity: Not Currently  Other Topics Concern   Not on file  Social History Narrative   Lives at home with family(son): daughter 8 blocks away. States "I'm never alone." Independent at baseline   Social Drivers of Corporate investment banker Strain: Not on file  Food Insecurity: No Food Insecurity (09/04/2022)   Hunger Vital Sign    Worried About Running Out of Food in the Last Year: Never true    Ran Out of Food in the Last Year: Never true  Transportation Needs: No Transportation Needs (09/04/2022)   PRAPARE - Administrator, Civil Service (Medical): No    Lack of Transportation (Non-Medical): No  Physical Activity: Not on file  Stress: Not on file  Social Connections: Not on file  Intimate Partner Violence: Not At Risk (11/26/2023)   Humiliation, Afraid, Rape, and Kick questionnaire    Fear of Current or Ex-Partner: No    Emotionally Abused: No    Physically Abused: No    Sexually Abused: No    Family History  Problem Relation Age of Onset   Pneumonia Mother    Seizures Father    No Known Allergies I? Current Facility-Administered Medications  Medication Dose Route Frequency Provider Last Rate Last Admin   albuterol  (PROVENTIL ) (2.5 MG/3ML) 0.083% nebulizer solution 3 mL  3 mL Inhalation Q6H PRN [place], MD       aspirin  EC tablet 81 mg  81 mg Oral Daily Antoniette Batty T, MD   81 mg at 11/30/23 0853   cefTRIAXone  (ROCEPHIN ) 2 g in sodium chloride  0.9 % 100 mL IVPB  2 g Intravenous Q24H Jodeane Mulligan, DO 200 mL/hr at 11/30/23 0852 2 g at 11/30/23 1610   Chlorhexidine  Gluconate Cloth 2 % PADS 6 each  6 each Topical Daily Jodeane Mulligan, DO   6 each  at 11/30/23 1021   enoxaparin  (LOVENOX ) injection 40 mg  40 mg Subcutaneous Q24H Antoniette Batty T, MD   40 mg at 11/29/23 1836   guaiFENesin -dextromethorphan (ROBITUSSIN DM) 100-10 MG/5ML syrup 5 mL  5 mL Oral Q4H PRN Elisabeth Guild, NP   5 mL at 11/30/23 9604   levothyroxine  (SYNTHROID ) tablet 175 mcg  175 mcg Oral Daily Antoniette Batty T, MD   175 mcg at 11/30/23 0613   lip balm (BLISTEX) ointment 1 Application  1 Application Topical PRN Elisabeth Guild, NP   1 Application at 11/27/23 1009   montelukast  (SINGULAIR ) tablet 10 mg  10 mg Oral QHS Antoniette Batty T, MD   10 mg at 11/29/23 2154   ondansetron  (ZOFRAN ) tablet 4 mg  4 mg Oral Q6H PRN [place], MD       Or   ondansetron  (ZOFRAN )  injection 4 mg  4 mg Intravenous Q6H PRN [place], MD       Oral care mouth rinse  15 mL Mouth Rinse PRN Gideon Kussmaul, NP       pantoprazole  (PROTONIX ) EC tablet 40 mg  40 mg Oral Daily Antoniette Batty T, MD   40 mg at 11/30/23 4098   polyethylene glycol (MIRALAX  / GLYCOLAX ) packet 17 g  17 g Oral Daily PRN [place], MD       sodium chloride  flush (NS) 0.9 % injection 10-40 mL  10-40 mL Intracatheter PRN Jodeane Mulligan, DO       traMADol  (ULTRAM ) tablet 50 mg  50 mg Oral Q12H PRN [place], MD   50 mg at 11/29/23 2341     Abtx:  Anti-infectives (From admission, onward)    Start     Dose/Rate Route Frequency Ordered Stop   11/27/23 0900  cefTRIAXone  (ROCEPHIN ) 2 g in sodium chloride  0.9 % 100 mL IVPB        2 g 200 mL/hr over 30 Minutes Intravenous Every 24 hours 11/27/23 0747 12/02/23 0859   11/26/23 1445  azithromycin  (ZITHROMAX ) tablet 500 mg        500 mg Oral Daily 11/26/23 1444 11/30/23 0853   11/26/23 1000  vancomycin  (VANCOCIN ) IVPB 1000 mg/200 mL premix        1,000 mg 200 mL/hr over 60 Minutes Intravenous Every 24 hours 11/25/23 1303 11/29/23 1232   11/25/23 1700  ceFAZolin  (ANCEF ) IVPB 1 g/50 mL premix  Status:  Discontinued        1 g 100 mL/hr over 30 Minutes Intravenous  Every 8 hours 11/25/23 1307 11/27/23 0747   11/25/23 1330  vancomycin  (VANCOREADY) IVPB 500 mg/100 mL        500 mg 100 mL/hr over 60 Minutes Intravenous  Once 11/25/23 1247 11/25/23 1523   11/25/23 0845  piperacillin -tazobactam (ZOSYN ) IVPB 3.375 g        3.375 g 100 mL/hr over 30 Minutes Intravenous  Once 11/25/23 0830 11/25/23 0934   11/25/23 0830  cefTRIAXone  (ROCEPHIN ) 2 g in sodium chloride  0.9 % 100 mL IVPB  Status:  Discontinued        2 g 200 mL/hr over 30 Minutes Intravenous Once 11/25/23 0817 11/25/23 0830   11/25/23 0830  vancomycin  (VANCOCIN ) IVPB 1000 mg/200 mL premix        1,000 mg 200 mL/hr over 60 Minutes Intravenous  Once 11/25/23 0817 11/25/23 1059       REVIEW OF SYSTEMS:  Const: negative fever, negative chills, negative weight loss Eyes: negative diplopia or visual changes, negative eye pain ENT: negative coryza, negative sore throat Resp:  cough, h productive of white sputum, some shortness of breath Cards: negative for chest pain, palpitations, lower extremity edema GU: negative for frequency, dysuria and hematuria GI: Negative for abdominal pain, diarrhea, bleeding, constipation Skin: negative for rash and pruritus Heme: negative for easy bruising and gum/nose bleeding MS: Left thigh pain, left knee pain, left shoulder pain Neurolo:negative for headaches, dizziness, vertigo, memory problems  Psych: negative for feelings of anxiety, depression  Endocrine:-Hypothyroidism  allergy/Immunology- negative for any medication or food allergies ?  Objective:  VITALS:  BP (!) 147/85 (BP Location: Right Arm)   Pulse 77   Temp 98.8 F (37.1 C)   Resp 19   Ht 5\' 2"  (1.575 m)   Wt 71.2 kg   SpO2 98%   BMI 28.72 kg/m  PHYSICAL EXAM:  General: Alert, cooperative, no distress, appears stated age.  Head: Normocephalic, without obvious abnormality, atraumatic. Eyes: Conjunctivae clear, anicteric sclerae. Pupils are equal ENT Nares normal. No drainage or sinus  tenderness. Lips, mucosa, and tongue normal. No Thrush Neck: Supple, symmetrical, no adenopathy, thyroid : non tender no carotid bruit and no JVD. Back: No CVA tenderness. Lungs: Bilateral air entry Heart: Regular rate and rhythm, no murmur, rub or gallop. Abdomen: Soft, non-tender,not distended. Bowel sounds normal. No masses Extremities: Left upper thigh severe tenderness, erythema, induration with superficial scars skin peeling and purulent discharge from the center of the macerated area 11/30/23   11/25/23   Skin: No rashes or lesions. Or bruising Lymph: Cervical, supraclavicular normal. Neurologic: Grossly non-focal Pertinent Labs Lab Results CBC    Component Value Date/Time   WBC 4.2 11/30/2023 0635   RBC 3.74 (L) 11/30/2023 0635   HGB 10.2 (L) 11/30/2023 0635   HGB 10.7 (L) 11/22/2023 0910   HCT 32.1 (L) 11/30/2023 0635   PLT 234 11/30/2023 0635   PLT 110 (L) 11/22/2023 0910   MCV 85.8 11/30/2023 0635   MCH 27.3 11/30/2023 0635   MCHC 31.8 11/30/2023 0635   RDW 17.7 (H) 11/30/2023 0635   LYMPHSABS 0.4 (L) 11/26/2023 0136   MONOABS 0.4 11/26/2023 0136   EOSABS 0.0 11/26/2023 0136   BASOSABS 0.1 11/26/2023 0136       Latest Ref Rng & Units 11/30/2023    6:35 AM 11/29/2023    4:58 AM 11/28/2023    5:45 AM  CMP  Glucose 70 - 99 mg/dL 83  75  66   BUN 8 - 23 mg/dL 5  8  16    Creatinine 0.44 - 1.00 mg/dL 1.61  0.96  0.45   Sodium 135 - 145 mmol/L 138  139  138   Potassium 3.5 - 5.1 mmol/L 3.5  3.4  3.6   Chloride 98 - 111 mmol/L 105  107  105   CO2 22 - 32 mmol/L 26  23  20    Calcium  8.9 - 10.3 mg/dL 7.8  7.3  7.3       Microbiology: Recent Results (from the past 240 hours)  Blood culture (routine x 2)     Status: None   Collection Time: 11/25/23  7:03 AM   Specimen: BLOOD  Result Value Ref Range Status   Specimen Description BLOOD RIGHT ANTECUBITAL  Final   Special Requests   Final    BOTTLES DRAWN AEROBIC AND ANAEROBIC Blood Culture adequate volume    Culture   Final    NO GROWTH 5 DAYS Performed at [facility], [address] [place], [place] [address]    Report Status 11/30/2023 FINAL  Final  Blood culture (routine x 2)     Status: None   Collection Time: 11/25/23  9:29 AM   Specimen: BLOOD  Result Value Ref Range Status   Specimen Description BLOOD BLOOD RIGHT HAND  Final   Special Requests   Final    BOTTLES DRAWN AEROBIC AND ANAEROBIC Blood Culture adequate volume   Culture   Final    NO GROWTH 5 DAYS Performed at [facility], [address], [place], [place] [address]    Report Status 11/30/2023 FINAL  Final  MRSA Next Gen by PCR, Nasal     Status: None   Collection Time: 11/25/23 12:59 PM   Specimen: Nasal Mucosa; Nasal Swab  Result Value Ref Range Status   MRSA by PCR Next Gen  NOT DETECTED NOT DETECTED Final    Comment: (NOTE) The GeneXpert MRSA Assay (FDA approved for NASAL specimens only), is one component of a comprehensive MRSA colonization surveillance program. It is not intended to diagnose MRSA infection nor to guide or monitor treatment for MRSA infections. Test performance is not FDA approved in patients less than 19 years old. Performed at [facility], [address] [place], [place] [address]   Group A Strep by PCR     Status: None   Collection Time: 11/25/23 12:59 PM   Specimen: Nasal Mucosa; Sterile Swab  Result Value Ref Range Status   Group A Strep by PCR NOT DETECTED NOT DETECTED Final    Comment: Performed at [facility], [address] [place], [place] [address]  Expectorated Sputum Assessment w Gram Stain, Rflx to Resp Cult     Status: None   Collection Time: 11/27/23 12:15 PM   Specimen: Sputum  Result Value Ref Range Status   Specimen Description SPUTUM  Final   Special Requests NONE  Final   Sputum evaluation   Final    THIS SPECIMEN IS ACCEPTABLE FOR SPUTUM CULTURE Performed at [facility], [address], [place], [place]  [address]    Report Status 11/27/2023 FINAL  Final  Culture, Respiratory w Gram Stain     Status: None   Collection Time: 11/27/23 12:15 PM   Specimen: SPU  Result Value Ref Range Status   Specimen Description   Final    SPUTUM Performed at [facility], [address] [place], [place] [address]    Special Requests   Final    NONE Reflexed from [phone] [id number] Performed at [facility], [address] [place], [place] [address]    Gram Stain   Final    RARE WBC PRESENT, PREDOMINANTLY PMN FEW GRAM POSITIVE COCCI    Culture   Final    Normal respiratory flora-no Staph aureus or Pseudomonas seen Performed at [facility], [address], [place], [place] [address]    Report Status 11/29/2023 FINAL  Final    IMAGING RESULTS: CT thigh reviewed.  Persistent subcutaneous edema and fat stranding extending along the left proximal to mid medial thigh with overlying skin thickening.  I have personally reviewed the films ? Impression/Recommendation Pleural cellulitis with abscess on the left upper thigh. Likely organism is MRSA Cultures taken from the superficial purulent discharge Would recommend surgical consult as patient likely needs I&D he. Currently on ceftriaxone  and azithromycin . Add linezolid and change ceftriaxone  to Zosyn .  Multiple myeloma status post stem cell transplantation in 2019 and recurrence now on 7 which chemotherapy History of recurrent strep pneumoniae bacteremia and history of left knee septic arthritis  Anemia  Acquired hypothyroidism following thyroidectomy for Graves' On Synthroid   I have personally spent  -75--minutes involved in face-to-face and non-face-to-face activities for this patient on the day of the visit. Professional time spent includes the following activities: Preparing to see the patient (review of tests), Obtaining and/or reviewing separately obtained history (admission/discharge record), Performing a medically appropriate  examination and/or evaluation , Ordering medications/tests/procedures, referring and communicating with other health care professionals, Documenting clinical information in the EMR, Independently interpreting results (not separately reported), Communicating results to the patient/family/caregiver, Counseling and educating the patient/ and Care coordination with hospitalist and surgeon. This consult involved complex antimicrobial management  ________________________________________________  Note:  This document was prepared using Dragon voice recognition software and may include unintentional dictation errors.

## 2023-11-30 NOTE — Assessment & Plan Note (Addendum)
 11-30-2023 not convinced she has pneumonia. Pt does have hx of COPD. On IV rocephin . Not wheezing or hypoxic.  12-01-2023 stable   12-02-2023 stable  12-03-2023 stable  12-04-2023 stable  12-05-2023 stable  12-06-2023 stable.

## 2023-11-30 NOTE — Assessment & Plan Note (Signed)
Treated and resolved °

## 2023-11-30 NOTE — Consult Note (Signed)
 [facility] SURGICAL CONSULTATION NOTE (initial) - cpt: 99254   HISTORY OF PRESENT ILLNESS (HPI):  73 y.o. female immune compromised for treatment of multiple myeloma on chemotherapy was admitted May 2 for evaluation of possible infected bite of the proximal medial left thigh. Patient reports gradually worsening process despite IV antibiotics.  Currently draining/weeping from the proximal medial thigh wound with increasing pain and tenderness.  Surgery is consulted by hospitalist physician Dr. 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  Dr. Fawn Hooks consulted today and initiated linezolid.  PAST MEDICAL HISTORY (PMH):  Past Medical History:  Diagnosis Date   (HFpEF) heart failure with preserved ejection fraction (HCC)    a. 06/2020 Echo: EF 60-65%, no rwma, nl RV size/fxn. Triv MR. Triv TR/AI. Mod elev PASP.   Anxiety    COPD (chronic obstructive pulmonary disease) (HCC)    GERD (gastroesophageal reflux disease)    Hypertension    Hyperthyroidism    Hypokalemia    IgA myeloma (HCC)    Multiple myeloma (HCC)    Pneumonia    Red blood cell antibody positive, compatible PRBC difficult to obtain    S/P autologous bone marrow transplantation (HCC)      PAST SURGICAL HISTORY (PSH):  Past Surgical History:  Procedure Laterality Date   ANTERIOR VITRECTOMY Left 10/07/2021   Procedure: ANTERIOR VITRECTOMY;  Surgeon: Annell Kidney, MD;  Location: [facility];  Service: Ophthalmology;  Laterality: Left;   CATARACT EXTRACTION W/PHACO Left 10/07/2021   Procedure: CATARACT EXTRACTION PHACO AND INTRAOCULAR LENS PLACEMENT (IOC) LEFT VISION BLUE;  Surgeon: Annell Kidney, MD;  Location: [name];  Service: Ophthalmology;  Laterality: Left;  kit for manual incision 14.56 01:21.4   CATARACT EXTRACTION W/PHACO Right 10/21/2021   Procedure: CATARACT EXTRACTION PHACO AND INTRAOCULAR LENS PLACEMENT  (IOC) RIGHT 11.12 01:48.1;  Surgeon: Annell Kidney, MD;  Location: [facility];  Service: Ophthalmology;  Laterality: Right;   ESOPHAGOGASTRODUODENOSCOPY (EGD) WITH PROPOFOL  N/A 03/30/2019   Procedure: ESOPHAGOGASTRODUODENOSCOPY (EGD) WITH PROPOFOL ;  Surgeon: Luke Salaam, MD;  Location: [facility];  Service: Gastroenterology;  Laterality: N/A;   IR BONE MARROW BIOPSY & ASPIRATION  05/25/2023   IR FLUORO GUIDE PORT INSERTION RIGHT  12/16/2017   IR IMAGING GUIDED PORT INSERTION  06/15/2021   KNEE ARTHROSCOPY Left 09/03/2022   Procedure: ARTHROSCOPY KNEE DEBRIDEMENT;  Surgeon: Jerlyn Moons, MD;  Location: [facility];  Service: Orthopedics;  Laterality: Left;   THYROIDECTOMY N/A      MEDICATIONS:  Prior to Admission medications   Medication Sig Start Date End Date Taking? Authorizing Provider  albuterol  (VENTOLIN  HFA) 108 (90 Base) MCG/ACT inhaler Inhale 2 puffs into the lungs every 6 (six) hours as needed for wheezing or shortness of breath. 10/10/23  Yes [name], MD  aspirin  EC 81 MG EC tablet Take 1 tablet (81 mg total) by mouth daily. 01/25/20  Yes Clearnce Curia, NP  Brinzolamide -Brimonidine  (SIMBRINZA) 1-0.2 % SUSP Apply 1 drop to eye in the morning and at bedtime. I drop both eyes twice a day   Yes [provider]  calcium  carbonate (CALCIUM  600 HIGH POTENCY) 600 MG TABS tablet Take 2 tablets (1,200 mg total) by mouth 2 (two) times daily. 08/05/23  Yes Nelda Balsam, NP  furosemide  (LASIX ) 40 MG tablet Take 1 tablet (40 mg total) by mouth daily. Patient taking differently: Take 40 mg by mouth daily as needed for edema or fluid. 10/02/21  Yes Furth, Cadence  H, PA-C  levothyroxine  (SYNTHROID ) 175 MCG tablet Take 1 tablet (175 mcg total) by mouth daily. For thyroid  11/22/23  Yes [name], [name], MD  lidocaine -prilocaine  (EMLA ) cream Apply 1 application topically as needed. Apply to port and cover with saran wrap 1-2 hours prior to port access 07/03/21   Yes [name], [name], MD  magnesium  oxide (MAG-OX) 400 (240 Mg) MG tablet Take 1 tablet (400 mg total) by mouth 2 (two) times daily. 07/08/23  Yes [name], [name], MD  montelukast  (SINGULAIR ) 10 MG tablet Take 1 tablet (10 mg total) by mouth at bedtime. 11/09/22  Yes [name], [name], MD  ondansetron  (ZOFRAN ) 8 MG tablet One pill every 8 hours as needed for nausea/vomitting. Patient taking differently: Take 8 mg by mouth every 8 (eight) hours as needed for nausea or vomiting. One pill every 8 hours as needed for nausea/vomitting. 10/11/23  Yes Borders, Carlene Che, NP  pantoprazole  (PROTONIX ) 40 MG tablet Take 1 tablet (40 mg total) by mouth daily. 03/23/23 11/25/23 Yes [name], [name], MD  polyethylene glycol (MIRALAX  / GLYCOLAX ) packet Take 17 g by mouth daily as needed for mild constipation. 04/11/17  Yes Gouru, Aruna, MD  potassium chloride  SA (KLOR-CON  M) 20 MEQ tablet Take 3 tablets (60 mEq total) by mouth daily. For low potassium 08/05/23  Yes Nelda Balsam, NP  spironolactone  (ALDACTONE ) 25 MG tablet Take 1 tablet by mouth once daily Patient taking differently: Take 25 mg by mouth daily as needed. 04/14/22  Yes End, [name], MD  traMADol  (ULTRAM ) 50 MG tablet Take 1 tablet (50 mg total) by mouth every 12 (twelve) hours as needed. Patient taking differently: Take 50 mg by mouth every 12 (twelve) hours as needed for moderate pain (pain score 4-6). 11/22/23  Yes [name], MD  Vitamin D , Ergocalciferol , (DRISDOL ) 1.25 MG (50000 UNIT) CAPS capsule Take 1 capsule by mouth once a week 09/30/23  Yes [name], MD     ALLERGIES:  No Known Allergies   SOCIAL HISTORY:  Social History   Socioeconomic History   Marital status: Single    Spouse name: Not on file   Number of children: 3   Years of education: Not on file   Highest education level: Not on file  Occupational History   Not on file  Tobacco Use   Smoking status: Former    Current packs/day:  0.00    Types: Cigarettes    Quit date: 03/18/2021    Years since quitting: 2.7   Smokeless tobacco: Never   Tobacco comments:    2 cigarettes QOD  Vaping Use   Vaping status: Never Used  Substance and Sexual Activity   Alcohol use: No   Drug use: No   Sexual activity: Not Currently  Other Topics Concern   Not on file  Social History Narrative   Lives at home with family(son): daughter 8 blocks away. States "I'm never alone." Independent at baseline   Social Drivers of Corporate investment banker Strain: Not on file  Food Insecurity: No Food Insecurity (09/04/2022)   Hunger Vital Sign    Worried About Running Out of Food in the Last Year: Never true    Ran Out of Food in the Last Year: Never true  Transportation Needs: No Transportation Needs (09/04/2022)   PRAPARE - Administrator, Civil Service (Medical): No    Lack of Transportation (Non-Medical): No  Physical Activity: Not on file  Stress: Not on file  Social Connections: Not on file  Intimate Partner Violence: Not At Risk (11/26/2023)   Humiliation, Afraid, Rape, and Kick questionnaire    Fear of Current or Ex-Partner: No    Emotionally Abused: No    Physically Abused: No    Sexually Abused: No     FAMILY HISTORY:  Family History  Problem Relation Age of Onset   Pneumonia Mother    Seizures Father       REVIEW OF SYSTEMS:  Review of Systems  All other systems reviewed and are negative.   VITAL SIGNS:  Temp:  [98.6 F (37 C)-98.9 F (37.2 C)] 98.7 F (37.1 C) (05/07 1443) Pulse Rate:  [66-148] 77 (05/07 1443) Resp:  [18-19] 18 (05/07 1443) BP: (131-166)/(80-124) 151/93 (05/07 1443) SpO2:  [95 %-99 %] 95 % (05/07 1443)     Height: 5\' 2"  (157.5 cm) Weight: 71.2 kg BMI (Calculated): 28.71   INTAKE/OUTPUT:  05/06 0701 - 05/07 0700 In: 0  Out: 1300 [Urine:1300]  PHYSICAL EXAM:  Physical Exam Blood pressure (!) 151/93, pulse 77, temperature 98.7 F (37.1 C), resp. rate 18, height 5\' 2"  (1.575  m), weight 71.2 kg, SpO2 95%. Last Weight  Most recent update: 11/25/2023  6:49 AM    Weight  71.2 kg (157 lb)             CONSTITUTIONAL: Well developed, and nourished, appropriately responsive and aware without distress.   EYES: Sclera non-icteric.   EARS, NOSE, MOUTH AND THROAT:  The oropharynx is clear. Oral mucosa is pink and moist.     Hearing is intact to voice.  NECK: Trachea is midline, and there is no jugular venous distension.  RESPIRATORY:  Lungs are clear, and breath sounds are equal bilaterally.   Normal respiratory effort without pathologic use of accessory muscles. CARDIOVASCULAR: Heart is regular in rate and rhythm.   Well perfused.  GI: The abdomen is  soft, nontender, and nondistended.  GU: Foley being placed currently. MUSCULOSKELETAL:  Symmetrical muscle tone appreciated in all four extremities. Warm without edema.  SKIN: Skin turgor is normal. No pathologic skin lesions appreciated.  Left medial proximal thigh: Exquisitely tender to the slightest palpation.  Weeping drainage from the central aspect of the necrotized dermis.\  NEUROLOGIC:  Motor and sensation appear grossly normal.  Cranial nerves are grossly without defect. PSYCH:  Alert and oriented to person, place and time. Affect is appropriate for situation.  Data Reviewed I have personally reviewed what is currently available of the patient's imaging, recent labs and medical records.    Labs:     Latest Ref Rng & Units 11/30/2023    6:35 AM 11/29/2023    4:58 AM 11/28/2023    5:45 AM  CBC  WBC 4.0 - 10.5 K/uL 4.2  3.3  6.5   Hemoglobin 12.0 - 15.0 g/dL 40.9  8.8  8.4   Hematocrit 36.0 - 46.0 % 32.1  27.2  26.1   Platelets 150 - 400 K/uL 234  136  78       Latest Ref Rng & Units 11/30/2023    6:35 AM 11/29/2023    4:58 AM 11/28/2023    5:45 AM  CMP  Glucose 70 - 99 mg/dL 83  75  66   BUN 8 - 23 mg/dL 5  8  16    Creatinine 0.44 - 1.00 mg/dL 8.11  9.14  7.82   Sodium 135 - 145 mmol/L 138  139  138    Potassium 3.5 - 5.1  mmol/L 3.5  3.4  3.6   Chloride 98 - 111 mmol/L 105  107  105   CO2 22 - 32 mmol/L 26  23  20    Calcium  8.9 - 10.3 mg/dL 7.8  7.3  7.3      Imaging studies: I have reviewed the CT imaging as noted/reported below:  CLINICAL DATA:  Sepsis.  Left upper thigh cellulitis.   EXAM: CT OF THE LOWER LEFT EXTREMITY WITH CONTRAST   TECHNIQUE: Multidetector CT imaging of the lower left extremity was performed according to the standard protocol following intravenous contrast administration.   RADIATION DOSE REDUCTION: This exam was performed according to the departmental dose-optimization program which includes automated exposure control, adjustment of the mA and/or kV according to patient size and/or use of iterative reconstruction technique.   CONTRAST:  OMNIPAQUE  IOHEXOL  300 MG/ML  SOLN   COMPARISON:  CT left femur dated 11/25/2023.   FINDINGS: Bones/Joint/Cartilage   No acute fracture or dislocation. No destructive osseous lesion. Moderate degenerative changes of the left hip with joint space narrowing and marginal osteophytosis. Degenerative changes of the pubic symphysis. Moderate degenerative changes of the left knee.   Ligaments   Ligaments are suboptimally evaluated by CT.   Soft tissue and Muscles   Persistent subcutaneous edema and fat stranding extending along the left proximal to mid medial thigh with overlying skin thickening. Increased subcutaneous edema and fat stranding along the left hip and thigh with overlying skin thickening. There is increased intramuscular edema within the left adductor compartment musculature with similar to slightly decreased stranding/edema extending along the left gracilis muscle. No discrete loculated fluid collection identified. No soft tissue gas.   Irregularity of the skin along the posterior proximal to mid left thigh is again noted and could reflect a wound.   Mild fatty infiltration of the  semimembranosus muscle is again noted. Moderate atherosclerotic vascular calcifications.   IMPRESSION: 1. Persistent subcutaneous edema and fat stranding extending along the left proximal to mid medial thigh with overlying skin thickening. Increased subcutaneous edema and fat stranding along the left hip and thigh with overlying skin thickening. These findings are compatible with cellulitis. No loculated fluid collection. 2. Increased intramuscular edema in the left adductor compartment musculature with similar to slightly decreased stranding/edema extending along the left gracilis muscle, concerning for myositis. No discrete intramuscular collection identified.     Electronically Signed   By: Mannie Seek M.D.   On: 11/29/2023 15:58  Last 24 hrs: No results found.   Assessment/Plan:  73 y.o. female with , complicated by pertinent comorbidities and immune compromise:  Patient Active Problem List   Diagnosis Date Noted   Severe sepsis (HCC) 11/25/2023   Hypothyroidism 05/24/2021   Leukopenia    COPD (chronic obstructive pulmonary disease) (HCC)    GERD (gastroesophageal reflux disease) 03/19/2021   Graves' disease 03/19/2021   Cellulitis of left thigh 10/10/2020   Chronic heart failure with preserved ejection fraction (HFpEF) (HCC) 12/14/2019   S/P autologous bone marrow transplantation (HCC) 06/29/2018   Essential hypertension 05/29/2018   Red blood cell antibody positive, compatible PRBC difficult to obtain 10/21/2017   Multiple myeloma not having achieved remission (HCC) 04/08/2017    - Agree with change of antibiotics.  - Will initiate I&D/excisional debridement first thing in the morning, with n.p.o. postmidnight.  - We discussed the role of postoperative wound care, and potential sizable wound and likely need for repetitive excisional debridements.  I believe she understands, considering the degree of her  pain and the prolonged nature of this current illness she is  anxious to proceed.  Unfortunately due to her n.p.o. status I do not believe the risk of anesthesia is worth taking in light of getting her into the OR first thing in the a.m.  - DVT prophylaxis  All of the above findings and recommendations were discussed with the patient and family(if present), and all of patient's questions were answered to their expressed satisfaction. Face-to-face time spent with the patient and accompanying care providers(if present) was 45 minutes, spent counseling, educating, and coordinating care of the patient.   Thank you for the opportunity to participate in this patient's care.   -- [name], M.D., FACS 11/30/2023, 4:17 PM

## 2023-11-30 NOTE — Progress Notes (Signed)
 PROGRESS NOTE    Brandi [name]  UJW:[id number] DOB: 1950/08/06 DOA: 11/25/2023 PCP: [name], MD  Subjective: Patient seen and examined.  She still has significant edema, induration, erythema of the left medial thigh.  While her lab work looks improved since admission, her left thigh is still quite erythematous and edematous.  Will ask infectious disease for some antibiotic intake.  Her repeat CT femur was negative for any abscess.   Hospital Course: HPI: Brandi [name] is a 73 y.o. female with medical history significant of MM on chemotherapy, chronic HFpEF, HTN, hypothyroidism, presented with GI symptoms including nauseous vomiting abdominal pain.  She also complains about worsening of left leg swelling pain and drainage.   Patient has history of multiple myeloma since 2018 and has been on chemotherapy, last chemo was this past Tuesday.  Started on Wednesday, patient started to feel nauseous and cramping-like lower abdominal pain, with multiple vomiting on Wednesday but no more vomiting since Thursday.  Location of abdominal pain is rather poorly defined.  No exacerbating factors.  2 days ago she also started to notice " a boil" in the left inner thigh, gradually getting bigger and ruptured yesterday with thick yellowish pus coming out.  She has had some chills but no fever.  No chest pain shortness of breath no diarrhea denies any urinary symptoms.  Denied any active bleeding.  Significant Events: Admitted 11/25/2023 cellulitis   Significant Labs: WBC 3.5, HgB 11.6, plt 63 Na 136, K 2.4, CO2 of 18, BUN 19, Scr 0.91. glu 90 Ca 7.7, TP 6.6, alb 3.8, AST 18, ALT 10, alk phos 64 Mg 1.5  Significant Imaging Studies: CT abd/pelvis, CTPA No CT evidence for acute pulmonary embolus. 2. No acute findings in the abdomen or pelvis. Specifically, no findings to explain the patient's history of abdominal pain. 3. Endometrial stripe appears thickened at 13 mm on sagittal imaging. Correlation  for abnormal vaginal bleeding recommended. Follow-up pelvic ultrasound recommended to further evaluate as endometrial neoplasm could have this appearance. 4. Calcified uterine fibroids. 5. Two 2 mm left lung nodules.  CT left femur Moderate medial and posteromedial and mild anteromedial proximal to mid left thigh subcutaneous fat edema and swelling. Moderate thickening of the regional left thigh skin. No walled-off fluid collection is seen to indicate an abscess. 2. Mild focal thickening/irregularity of the skin at the posterior aspect of the proximal to mid left thigh. This may represent the region of the reported posterior thigh weeping wound. 3. No high-grade fascial thickening, subcutaneous air, or perifascial air is seen to suggest CT evidence of necrotizing fasciitis. 4. No definite destructive lytic lesion is seen within the left femur in this patient with history of multiple myeloma. 5. 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 CT head Left middle ear and mastoid opacification is new since 2022. Consider with Acute Otitis Media with mastoid effusion. 2. 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 CXR Upper zone pulmonary vascular prominence raises the possibility of pulmonary venous hypertension. 2. Suspected linear/bandlike subsegmental atelectasis or scarring at the left lung base. 3. Aortic Atherosclerosis 11-29-2023 CT left femur Persistent subcutaneous edema and fat stranding extending along the left proximal to mid medial thigh with overlying skin thickening. Increased subcutaneous edema and fat stranding along the left hip and thigh with overlying skin thickening. These findings are compatible with cellulitis. No loculated fluid collection. 2. Increased intramuscular edema in  the left adductor  compartment musculature with similar to slightly  decreased stranding/edema extending along the left gracilis muscle, concerning for myositis. No discrete intramuscular collection identified  Antibiotic Therapy: Anti-infectives (From admission, onward)    Start     Dose/Rate Route Frequency Ordered Stop   11/27/23 0900  cefTRIAXone  (ROCEPHIN ) 2 g in sodium chloride  0.9 % 100 mL IVPB        2 g 200 mL/hr over 30 Minutes Intravenous Every 24 hours 11/27/23 0747 12/02/23 0859   11/26/23 1445  azithromycin  (ZITHROMAX ) tablet 500 mg        500 mg Oral Daily 11/26/23 1444 12/01/23 0959   11/26/23 1000  vancomycin  (VANCOCIN ) IVPB 1000 mg/200 mL premix        1,000 mg 200 mL/hr over 60 Minutes Intravenous Every 24 hours 11/25/23 1303 11/29/23 1232   11/25/23 1700  ceFAZolin  (ANCEF ) IVPB 1 g/50 mL premix  Status:  Discontinued        1 g 100 mL/hr over 30 Minutes Intravenous Every 8 hours 11/25/23 1307 11/27/23 0747   11/25/23 1330  vancomycin  (VANCOREADY) IVPB 500 mg/100 mL        500 mg 100 mL/hr over 60 Minutes Intravenous  Once 11/25/23 1247 11/25/23 1523   11/25/23 0845  piperacillin -tazobactam (ZOSYN ) IVPB 3.375 g        3.375 g 100 mL/hr over 30 Minutes Intravenous  Once 11/25/23 0830 11/25/23 0934   11/25/23 0830  cefTRIAXone  (ROCEPHIN ) 2 g in sodium chloride  0.9 % 100 mL IVPB  Status:  Discontinued        2 g 200 mL/hr over 30 Minutes Intravenous Once 11/25/23 0817 11/25/23 0830   11/25/23 0830  vancomycin  (VANCOCIN ) IVPB 1000 mg/200 mL premix        1,000 mg 200 mL/hr over 60 Minutes Intravenous  Once 11/25/23 0817 11/25/23 1059       Procedures:   Consultants:     Assessment and Plan: * Severe sepsis (HCC) On admission through 11-29-2023 Hypotension, tachycardia, elevated lactate 2.2 on presentation with known abscess given concern for severe sepsis.  Requiring multiple IV fluid boluses to maintain MAP greater than 65.  No vasopressor medications required at this time.  Blood pressure appears to be improving.  Continue to  monitor closely.  Blood cultures drawn.  Broad-spectrum antibiotic coverage on board.  Appears to be resolving.  No longer requiring midodrine .   11-30-2023 resolved.   Cellulitis of left thigh On admission through 11-29-2023 CT scan on presentation noting mostly medial left thigh subcutaneous edema, but no walled off fluid collection to indicate abscess.  Likely etiology of patient's sepsis.  Broad-spectrum antibiotic coverage on board initially cefazolin  and vancomycin .  Transitioning to ceftriaxone  2 g every 24 hours secondary to pneumonia coverage on 5/4.  Small weeping area with induration, daily dressing change.  Will order wound care consult.  Given continued induration induration and tenderness, will repeat CT of the area to assess for developed underlying abscess.  If abscess is present, will likely need to get general surgery involved for I&D.    11-30-2023 repeat CT femur yesterday still shows no abscess. Pt has been on IV rocephin /vanco for 5 days now. While her labs are improved, pt still with clinically significant cellulitis. Will ask ID consult to see patient to see if there are any abx changes that need to be made.  I don't see good anaerobic coverage right now but for a medial thigh cellulitis, I'm not sure what organisms  to target.  COPD (chronic obstructive pulmonary disease) (HCC) 11-30-2023 not convinced she has pneumonia. Pt does have hx of COPD. On IV rocephin . Not wheezing or hypoxic.  Chronic heart failure with preserved ejection fraction (HFpEF) (HCC) 11-30-2023 chronic. Not hypoxic. Lying flat in bed.  Essential hypertension 11-30-2023 stable. Pt with some swelling in left leg. Still restart low dose lasix  along with kcl supplementation.  Hypokalemia-resolved as of 11/30/2023 Treated and resolved.  DVT prophylaxis: enoxaparin  (LOVENOX ) injection 40 mg Start: 11/25/23 1800     Code Status: Full Code Family Communication: no family at bedside Disposition Plan: SNF vs  home.  Reason for continuing need for hospitalization: remains on IV ABX. Awaiting ID consult  Objective: Vitals:   11/30/23 0752 11/30/23 1136 11/30/23 1432 11/30/23 1443  BP: (!) 144/80 (!) 147/85  (!) 151/93  Pulse: 66 77  77  Resp: 18 19  18   Temp: 98.6 F (37 C) 98.8 F (37.1 C)  98.7 F (37.1 C)  TempSrc:      SpO2: 99% 98% 99% 95%  Weight:      Height:        Intake/Output Summary (Last 24 hours) at 11/30/2023 1451 Last data filed at 11/30/2023 0406 Gross per 24 hour  Intake 0 ml  Output 1300 ml  Net -1300 ml   Filed Weights   11/25/23 0649  Weight: 71.2 kg    Examination:  Physical Exam Vitals and nursing note reviewed.  Constitutional:      General: She is not in acute distress.    Appearance: She is not toxic-appearing or diaphoretic.  HENT:     Head: Normocephalic and atraumatic.     Nose: Nose normal.  Cardiovascular:     Rate and Rhythm: Normal rate and regular rhythm.     Comments: No JVD. Lying flat on hospital bed. Pulmonary:     Effort: Pulmonary effort is normal. No respiratory distress.     Breath sounds: Normal breath sounds. No wheezing or rales.  Abdominal:     General: Bowel sounds are normal.     Palpations: Abdomen is soft.  Skin:    Capillary Refill: Capillary refill takes less than 2 seconds.     Comments: Erythema, induration of left medial inner thigh.  Neurological:     Mental Status: She is alert and oriented to person, place, and time.     Data Reviewed: I have personally reviewed following labs and imaging studies  CBC: Recent Labs  Lab 11/25/23 0703 11/26/23 0136 11/26/23 0520 11/27/23 0525 11/28/23 0545 11/29/23 0458 11/30/23 0635  WBC 3.5* 10.7* 9.7 10.7* 6.5 3.3* 4.2  NEUTROABS 2.4 9.4*  --   --   --   --   --   HGB 11.6* 9.5* 8.5* 8.0* 8.4* 8.8* 10.2*  HCT 36.3 30.4* 26.7* 24.3* 26.1* 27.2* 32.1*  MCV 87.1 87.6 87.5 85.3 86.1 86.3 85.8  PLT 63* 27* 22* 32* 78* 136* 234   Basic Metabolic Panel: Recent Labs   Lab 11/25/23 0929 11/25/23 2004 11/26/23 0520 11/26/23 1412 11/27/23 0525 11/28/23 0545 11/29/23 0458 11/30/23 0635  NA  --    < >  --  138 140 138 139 138  K  --    < >  --  3.1* 3.7 3.6 3.4* 3.5  CL  --    < >  --  109 109 105 107 105  CO2  --    < >  --  22 22 20* 23 26  GLUCOSE  --    < >  --  91 70 66* 75 83  BUN  --    < >  --  25* 23 16 8  <5*  CREATININE  --    < >  --  0.90 0.82 0.70 0.55 0.71  CALCIUM   --    < >  --  6.5* 7.2* 7.3* 7.3* 7.8*  MG  --    < > 2.1  --  2.2 2.2 2.3 2.0  PHOS 2.2*  --  2.9  --   --  2.9  --   --    < > = values in this interval not displayed.   GFR: Estimated Creatinine Clearance: 59.6 mL/min (by C-G formula based on SCr of 0.71 mg/dL). Liver Function Tests: Recent Labs  Lab 11/25/23 0703 11/26/23 0018 11/28/23 0545  AST 18 12*  --   ALT 10 9  --   ALKPHOS 64 44  --   BILITOT 1.5* 0.9  --   PROT 6.6 4.8*  --   ALBUMIN 3.8 2.7* 2.8*   Recent Labs  Lab 11/25/23 0703  LIPASE 21   BNP (last 3 results) Recent Labs    03/30/23 0918  BNP 17.5   CBG: Recent Labs  Lab 11/29/23 2014 11/29/23 2352 11/30/23 0406 11/30/23 0752 11/30/23 1136  GLUCAP 100* 86 87 92 90   Sepsis Labs: Recent Labs  Lab 11/25/23 0703 11/25/23 0929 11/26/23 0520  LATICACIDVEN 3.5* 2.1* 2.2*    Recent Results (from the past 240 hours)  Blood culture (routine x 2)     Status: None   Collection Time: 11/25/23  7:03 AM   Specimen: BLOOD  Result Value Ref Range Status   Specimen Description BLOOD RIGHT ANTECUBITAL  Final   Special Requests   Final    BOTTLES DRAWN AEROBIC AND ANAEROBIC Blood Culture adequate volume   Culture   Final    NO GROWTH 5 DAYS Performed at [facility], [address], [place], [place] [address]    Report Status 11/30/2023 FINAL  Final  Blood culture (routine x 2)     Status: None   Collection Time: 11/25/23  9:29 AM   Specimen: BLOOD  Result Value Ref Range Status   Specimen Description BLOOD BLOOD  RIGHT HAND  Final   Special Requests   Final    BOTTLES DRAWN AEROBIC AND ANAEROBIC Blood Culture adequate volume   Culture   Final    NO GROWTH 5 DAYS Performed at [facility], [address] [place], [place] [address]    Report Status 11/30/2023 FINAL  Final  MRSA Next Gen by PCR, Nasal     Status: None   Collection Time: 11/25/23 12:59 PM   Specimen: Nasal Mucosa; Nasal Swab  Result Value Ref Range Status   MRSA by PCR Next Gen NOT DETECTED NOT DETECTED Final    Comment: (NOTE) The GeneXpert MRSA Assay (FDA approved for NASAL specimens only), is one component of a comprehensive MRSA colonization surveillance program. It is not intended to diagnose MRSA infection nor to guide or monitor treatment for MRSA infections. Test performance is not FDA approved in patients less than 76 years old. Performed at [facility], [address] [place], [place] [address]   Group A Strep by PCR     Status: None   Collection Time: 11/25/23 12:59 PM   Specimen: Nasal Mucosa; Sterile Swab  Result Value Ref Range Status   Group A  Strep by PCR NOT DETECTED NOT DETECTED Final    Comment: Performed at [facility], [address] [place], [place] [address]  Expectorated Sputum Assessment w Gram Stain, Rflx to Resp Cult     Status: None   Collection Time: 11/27/23 12:15 PM   Specimen: Sputum  Result Value Ref Range Status   Specimen Description SPUTUM  Final   Special Requests NONE  Final   Sputum evaluation   Final    THIS SPECIMEN IS ACCEPTABLE FOR SPUTUM CULTURE Performed at [facility], [address], [place], [place] [address]    Report Status 11/27/2023 FINAL  Final  Culture, Respiratory w Gram Stain     Status: None   Collection Time: 11/27/23 12:15 PM   Specimen: SPU  Result Value Ref Range Status   Specimen Description   Final    SPUTUM Performed at [facility], [address], [place], [place] [address]    Special Requests    Final    NONE Reflexed from [id number] Performed at [facility], [address] [place], [place] [address]    Gram Stain   Final    RARE WBC PRESENT, PREDOMINANTLY PMN FEW GRAM POSITIVE COCCI    Culture   Final    Normal respiratory flora-no Staph aureus or Pseudomonas seen Performed at [facility], [address] [place], [place] [address]    Report Status 11/29/2023 FINAL  Final     Radiology Studies: CT FEMUR LEFT W CONTRAST Result Date: 11/29/2023 CLINICAL DATA:  Sepsis.  Left upper thigh cellulitis. EXAM: CT OF THE LOWER LEFT EXTREMITY WITH CONTRAST TECHNIQUE: Multidetector CT imaging of the lower left extremity was performed according to the standard protocol following intravenous contrast administration. RADIATION DOSE REDUCTION: This exam was performed according to the departmental dose-optimization program which includes automated exposure control, adjustment of the mA and/or kV according to patient size and/or use of iterative reconstruction technique. CONTRAST:  OMNIPAQUE  IOHEXOL  300 MG/ML  SOLN COMPARISON:  CT left femur dated 11/25/2023. FINDINGS: Bones/Joint/Cartilage No acute fracture or dislocation. No destructive osseous lesion. Moderate degenerative changes of the left hip with joint space narrowing and marginal osteophytosis. Degenerative changes of the pubic symphysis. Moderate degenerative changes of the left knee. Ligaments Ligaments are suboptimally evaluated by CT. Soft tissue and Muscles Persistent subcutaneous edema and fat stranding extending along the left proximal to mid medial thigh with overlying skin thickening. Increased subcutaneous edema and fat stranding along the left hip and thigh with overlying skin thickening. There is increased intramuscular edema within the left adductor compartment musculature with similar to slightly decreased stranding/edema extending along the left gracilis muscle. No discrete loculated fluid collection identified.  No soft tissue gas. Irregularity of the skin along the posterior proximal to mid left thigh is again noted and could reflect a wound. Mild fatty infiltration of the semimembranosus muscle is again noted. Moderate atherosclerotic vascular calcifications. IMPRESSION: 1. Persistent subcutaneous edema and fat stranding extending along the left proximal to mid medial thigh with overlying skin thickening. Increased subcutaneous edema and fat stranding along the left hip and thigh with overlying skin thickening. These findings are compatible with cellulitis. No loculated fluid collection. 2. Increased intramuscular edema in the left adductor compartment musculature with similar to slightly decreased stranding/edema extending along the left gracilis muscle, concerning for myositis. No discrete intramuscular collection identified. Electronically Signed   By: Mannie Seek M.D.   On: 11/29/2023 15:58    Scheduled Meds:  aspirin  EC  81 mg Oral Daily   Chlorhexidine  Gluconate Cloth  6 each Topical Daily   enoxaparin  (LOVENOX ) injection  40 mg Subcutaneous Q24H   levothyroxine   175 mcg Oral Daily   montelukast   10 mg Oral QHS   pantoprazole   40 mg Oral Daily   Continuous Infusions:  cefTRIAXone  (ROCEPHIN )  IV 2 g (11/30/23 0852)     LOS: 5 days   Time spent: 50 minutes  Unk Garb, DO  [facility]  11/30/2023, 2:51 PM

## 2023-11-30 NOTE — Progress Notes (Signed)
 Occupational Therapy Treatment Patient Details Name: Brandi [name] MRN: [id number] DOB: 10-21-1950 Today's Date: 11/30/2023   History of present illness Pt is a 73 y.o. female admitted with severe sepsis in the setting of L upper thigh cellulitis, GI symptoms including nausea, vomiting and abdominal pain, possible pneumonia, hypokalemia, thrombocytopenia. PMH significant for MM on chemotherapy, chronic HFpEF, HTN, & hypothyroidism   OT comments  Patient supine in bed with HOB elevated.  Therapist facilitated supine to sit EOB with SBA with increased time.  Therapist facilitated sit to stand transfer to RW level with skilled instruction and cuing on hand placement to reduce risk of falls.  Facilitated step pivot transfer to chair utilizing RW with skilled cuing for safety awareness with RW management and for safe hand placement.  Therapist provided education and encouragement on patient incorporating ankle pumps and BUE AROM while seated to support improving activity tolerance required for ADL task engagement with patient verbalizing understanding and agreement.  Skilled interventions to increase patient's functional engagement and independence in ADLs and functional mobility directly related to ADLs in order to increase able to transition safety to least restrictive environment with reduced burden of care.  At end of session, patient seated in recliner chair (patient requested recliner chair to remain in upright position with feet on the floor).  Call button within reach, chair alarm on and in place, and tray table within reach.  Patient making progress toward goals, discharge recommendation remains appropriate.       If plan is discharge home, recommend the following:  A little help with walking and/or transfers;A little help with bathing/dressing/bathroom;Assistance with cooking/housework;Assist for transportation   Equipment Recommendations  None recommended by OT    Recommendations for Other  Services      Precautions / Restrictions Precautions Precautions: Fall Restrictions Weight Bearing Restrictions Per Provider Order: No       Mobility Bed Mobility Overal bed mobility: Needs Assistance Bed Mobility: Supine to Sit     Supine to sit: Supervision          Transfers Overall transfer level: Needs assistance Equipment used: Rolling walker (2 wheels) Transfers: Sit to/from Stand, Bed to chair/wheelchair/BSC Sit to Stand: Contact guard assist     Step pivot transfers: Contact guard assist     General transfer comment: patient able to complete sit to stand transfer with CGA/SBA and Step Pivot transfer at RW level with CGA/SBA with cuing required for safety awareness for hand placement and RW management.     Balance Overall balance assessment: Needs assistance Sitting-balance support: Feet supported Sitting balance-Leahy Scale: Good     Standing balance support: Bilateral upper extremity supported, Reliant on assistive device for balance Standing balance-Leahy Scale: Fair                             ADL either performed or assessed with clinical judgement   ADL Overall ADL's : Needs assistance/impaired                         Toilet Transfer: Contact guard assist;Ambulation;Rolling walker (2 wheels) Toilet Transfer Details (indicate cue type and reason): clinical judgement; simulated to recliner         Functional mobility during ADLs: Contact guard assist;Rolling walker (2 wheels);Cueing for safety      Extremity/Trunk Assessment Upper Extremity Assessment Upper Extremity Assessment: Generalized weakness  Vision       Perception     Praxis     Communication Communication Communication: No apparent difficulties   Cognition Arousal: Alert Behavior During Therapy: WFL for tasks assessed/performed Cognition: No apparent impairments                               Following commands:  Intact        Cueing   Cueing Techniques: Verbal cues, Tactile cues  Exercises Other Exercises Other Exercises: Provided education on ankle pumps and AROM for BUEs while seated in chair to support increasing activity tolerance with patient verbalizing understanding.    Shoulder Instructions       General Comments      Pertinent Vitals/ Pain       Pain Assessment Pain Assessment: Faces Faces Pain Scale: Hurts little more Pain Location: L thigh Pain Intervention(s): Limited activity within patient's tolerance, Monitored during session  Home Living                                          Prior Functioning/Environment              Frequency  Min 2X/week        Progress Toward Goals  OT Goals(current goals can now be found in the care plan section)  Progress towards OT goals: Progressing toward goals     Plan      Co-evaluation      Reason for Co-Treatment: For patient/therapist safety;To address functional/ADL transfers PT goals addressed during session: Mobility/safety with mobility;Balance OT goals addressed during session: ADL's and self-care      AM-PAC OT "6 Clicks" Daily Activity     Outcome Measure   Help from another person eating meals?: None Help from another person taking care of personal grooming?: None Help from another person toileting, which includes using toliet, bedpan, or urinal?: A Little Help from another person bathing (including washing, rinsing, drying)?: A Little Help from another person to put on and taking off regular upper body clothing?: A Little Help from another person to put on and taking off regular lower body clothing?: A Little 6 Click Score: 20    End of Session Equipment Utilized During Treatment: Gait belt;Rolling walker (2 wheels)  OT Visit Diagnosis: Muscle weakness (generalized) (M62.81);Other abnormalities of gait and mobility (R26.89);Unsteadiness on feet (R26.81)   Activity Tolerance  Patient tolerated treatment well   Patient Left in chair;with call bell/phone within reach;with chair alarm set   Nurse Communication Mobility status        Time: 1610-9604 OT Time Calculation (min): 22 min  Charges: OT General Charges $OT Visit: 1 Visit OT Treatments $Therapeutic Activity: 8-22 mins  Brandi [name] OTR/L   Lucas Rushing 11/30/2023, 2:56 PM

## 2023-11-30 NOTE — Progress Notes (Signed)
 PT Cancellation Note  Patient Details Name: Brandi [name] MRN: [id number] DOB: 03-May-1951   Cancelled Treatment:    Reason Eval/Treat Not Completed: Other (comment) (Attempted to work with patient thrice, pt with other provider or requesting rest. Will attempt again at later date/time.)  4:34 PM, 11/30/23 Dawn Eth, PT, DPT Physical Therapist - [facility]  [phone] (ASCOM)    [name] 11/30/2023, 4:34 PM

## 2023-11-30 NOTE — Assessment & Plan Note (Addendum)
 On admission through 11-29-2023 CT scan on presentation noting mostly medial left thigh subcutaneous edema, but no walled off fluid collection to indicate abscess.  Likely etiology of patient's sepsis.  Broad-spectrum antibiotic coverage on board initially cefazolin  and vancomycin .  Transitioning to ceftriaxone  2 g every 24 hours secondary to pneumonia coverage on 5/4.  Small weeping area with induration, daily dressing change.  Will order wound care consult.  Given continued induration induration and tenderness, will repeat CT of the area to assess for developed underlying abscess.  If abscess is present, will likely need to get general surgery involved for I&D.    11-30-2023 repeat CT femur yesterday still shows no abscess. Pt has been on IV rocephin /vanco for 5 days now. While her labs are improved, pt still with clinically significant cellulitis. Will ask ID consult to see patient to see if there are any abx changes that need to be made.  I don't see good anaerobic coverage right now but for a medial thigh cellulitis, I'm not sure what organisms to target.  12-01-2023 s/p I&D in OR today. 8 x 8 x 2 cm abscess/necrotic tissue was drained/debrided in OR. Cultures taken. IV dilaudid  prn ordered. Remains on IV zosyn  and po zyvox .   12-02-2023 continue with zyvox  and IV zosyn .  Left thigh still numb from Exparel /lidocaine  that was injected into wound edges. Afebrile. Intra-op wound cx growing staph aureus and GNR.  General surgery approved starting chemical DVT prophylaxis today.  Awaiting ID recs. Continue prn IV dilaudid .  12-03-2023 remains on IV zosyn /po zyvox . Awaiting final cultures. Will need home health RN for wound care. And ambulatory referral to wound care clinic.  12-04-2023 Operative cultures growing MRSA, Morganella and E. Coli.  On IV zosyn /PO Zyvox  which covers all organisms. Pt has port-a-cath. So conceivably, she could get IV Zosyn  at home if ID thinks this is appropriate. Will await ID  to return on Monday to decide on home abx regimen.  12-05-2023 ID thinks pt can go home on IV Invanz and po zyvox . Will await final OPAT orders.  *update OPAT Orders Discharge antibiotics: Ertapenem 1 gram IV every 24hrs  AND PO linezolid   Duration: 7 days End Date: 12/12/23   Pt has PORT- port care   Labs weekly while on IV antibiotics: _X_ CBC with differential   _X_ CMP  *update. General surgery wants pt go to home on wound vac and continue this as outpatient.   12-06-2023 wound vac placed yesterday. Ordered by surgery. Ready for DC today as soon as home wound vac machine is delivered.

## 2023-11-30 NOTE — H&P (View-Only) (Signed)
 [facility] SURGICAL CONSULTATION NOTE (initial) - cpt: [id number]   HISTORY OF PRESENT ILLNESS (HPI):  73 y.o. female immune compromised for treatment of multiple myeloma on chemotherapy was admitted May 2 for evaluation of possible infected bite of the proximal medial left thigh. Patient reports gradually worsening process despite IV antibiotics.  Currently draining/weeping from the proximal medial thigh wound with increasing pain and tenderness.  Surgery is consulted by hospitalist physician Dr. 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  Dr. Fawn Hooks consulted today and initiated linezolid.  PAST MEDICAL HISTORY (PMH):  Past Medical History:  Diagnosis Date   (HFpEF) heart failure with preserved ejection fraction (HCC)    a. 06/2020 Echo: EF 60-65%, no rwma, nl RV size/fxn. Triv MR. Triv TR/AI. Mod elev PASP.   Anxiety    COPD (chronic obstructive pulmonary disease) (HCC)    GERD (gastroesophageal reflux disease)    Hypertension    Hyperthyroidism    Hypokalemia    IgA myeloma (HCC)    Multiple myeloma (HCC)    Pneumonia    Red blood cell antibody positive, compatible PRBC difficult to obtain    S/P autologous bone marrow transplantation (HCC)      PAST SURGICAL HISTORY (PSH):  Past Surgical History:  Procedure Laterality Date   ANTERIOR VITRECTOMY Left 10/07/2021   Procedure: ANTERIOR VITRECTOMY;  Surgeon: Annell Kidney, MD;  Location: [facility];  Service: Ophthalmology;  Laterality: Left;   CATARACT EXTRACTION W/PHACO Left 10/07/2021   Procedure: CATARACT EXTRACTION PHACO AND INTRAOCULAR LENS PLACEMENT (IOC) LEFT VISION BLUE;  Surgeon: Annell Kidney, MD;  Location: [name];  Service: Ophthalmology;  Laterality: Left;  kit for manual incision 14.56 01:21.4   CATARACT EXTRACTION W/PHACO Right 10/21/2021   Procedure: CATARACT EXTRACTION PHACO AND INTRAOCULAR LENS PLACEMENT  (IOC) RIGHT 11.12 01:48.1;  Surgeon: Annell Kidney, MD;  Location: [facility];  Service: Ophthalmology;  Laterality: Right;   ESOPHAGOGASTRODUODENOSCOPY (EGD) WITH PROPOFOL  N/A 03/30/2019   Procedure: ESOPHAGOGASTRODUODENOSCOPY (EGD) WITH PROPOFOL ;  Surgeon: Luke Salaam, MD;  Location: [facility];  Service: Gastroenterology;  Laterality: N/A;   IR BONE MARROW BIOPSY & ASPIRATION  05/25/2023   IR FLUORO GUIDE PORT INSERTION RIGHT  12/16/2017   IR IMAGING GUIDED PORT INSERTION  06/15/2021   KNEE ARTHROSCOPY Left 09/03/2022   Procedure: ARTHROSCOPY KNEE DEBRIDEMENT;  Surgeon: Jerlyn Moons, MD;  Location: [facility];  Service: Orthopedics;  Laterality: Left;   THYROIDECTOMY N/A      MEDICATIONS:  Prior to Admission medications   Medication Sig Start Date End Date Taking? Authorizing Provider  albuterol  (VENTOLIN  HFA) 108 (90 Base) MCG/ACT inhaler Inhale 2 puffs into the lungs every 6 (six) hours as needed for wheezing or shortness of breath. 10/10/23  Yes [name], MD  aspirin  EC 81 MG EC tablet Take 1 tablet (81 mg total) by mouth daily. 01/25/20  Yes Clearnce Curia, NP  Brinzolamide -Brimonidine  (SIMBRINZA) 1-0.2 % SUSP Apply 1 drop to eye in the morning and at bedtime. I drop both eyes twice a day   Yes [provider]  calcium  carbonate (CALCIUM  600 HIGH POTENCY) 600 MG TABS tablet Take 2 tablets (1,200 mg total) by mouth 2 (two) times daily. 08/05/23  Yes Nelda Balsam, NP  furosemide  (LASIX ) 40 MG tablet Take 1 tablet (40 mg total) by mouth daily. Patient taking differently: Take 40 mg by mouth daily as needed for edema or fluid. 10/02/21  Yes Furth, Cadence  H, PA-C  levothyroxine  (SYNTHROID ) 175 MCG tablet Take 1 tablet (175 mcg total) by mouth daily. For thyroid  11/22/23  Yes [name], [name], MD  lidocaine -prilocaine  (EMLA ) cream Apply 1 application topically as needed. Apply to port and cover with saran wrap 1-2 hours prior to port access 07/03/21   Yes [name], [name], MD  magnesium  oxide (MAG-OX) 400 (240 Mg) MG tablet Take 1 tablet (400 mg total) by mouth 2 (two) times daily. 07/08/23  Yes [name], [name], MD  montelukast  (SINGULAIR ) 10 MG tablet Take 1 tablet (10 mg total) by mouth at bedtime. 11/09/22  Yes [name], [name], MD  ondansetron  (ZOFRAN ) 8 MG tablet One pill every 8 hours as needed for nausea/vomitting. Patient taking differently: Take 8 mg by mouth every 8 (eight) hours as needed for nausea or vomiting. One pill every 8 hours as needed for nausea/vomitting. 10/11/23  Yes Borders, Carlene Che, NP  pantoprazole  (PROTONIX ) 40 MG tablet Take 1 tablet (40 mg total) by mouth daily. 03/23/23 11/25/23 Yes [name], [name], MD  polyethylene glycol (MIRALAX  / GLYCOLAX ) packet Take 17 g by mouth daily as needed for mild constipation. 04/11/17  Yes Gouru, Aruna, MD  potassium chloride  SA (KLOR-CON  M) 20 MEQ tablet Take 3 tablets (60 mEq total) by mouth daily. For low potassium 08/05/23  Yes Nelda Balsam, NP  spironolactone  (ALDACTONE ) 25 MG tablet Take 1 tablet by mouth once daily Patient taking differently: Take 25 mg by mouth daily as needed. 04/14/22  Yes End, [name], MD  traMADol  (ULTRAM ) 50 MG tablet Take 1 tablet (50 mg total) by mouth every 12 (twelve) hours as needed. Patient taking differently: Take 50 mg by mouth every 12 (twelve) hours as needed for moderate pain (pain score 4-6). 11/22/23  Yes [name], MD  Vitamin D , Ergocalciferol , (DRISDOL ) 1.25 MG (50000 UNIT) CAPS capsule Take 1 capsule by mouth once a week 09/30/23  Yes [name], MD     ALLERGIES:  No Known Allergies   SOCIAL HISTORY:  Social History   Socioeconomic History   Marital status: Single    Spouse name: Not on file   Number of children: 3   Years of education: Not on file   Highest education level: Not on file  Occupational History   Not on file  Tobacco Use   Smoking status: Former    Current packs/day:  0.00    Types: Cigarettes    Quit date: 03/18/2021    Years since quitting: 2.7   Smokeless tobacco: Never   Tobacco comments:    2 cigarettes QOD  Vaping Use   Vaping status: Never Used  Substance and Sexual Activity   Alcohol use: No   Drug use: No   Sexual activity: Not Currently  Other Topics Concern   Not on file  Social History Narrative   Lives at home with family(son): daughter 8 blocks away. States "I'm never alone." Independent at baseline   Social Drivers of Corporate investment banker Strain: Not on file  Food Insecurity: No Food Insecurity (09/04/2022)   Hunger Vital Sign    Worried About Running Out of Food in the Last Year: Never true    Ran Out of Food in the Last Year: Never true  Transportation Needs: No Transportation Needs (09/04/2022)   PRAPARE - Administrator, Civil Service (Medical): No    Lack of Transportation (Non-Medical): No  Physical Activity: Not on file  Stress: Not on file  Social Connections: Not on file  Intimate Partner Violence: Not At Risk (11/26/2023)   Humiliation, Afraid, Rape, and Kick questionnaire    Fear of Current or Ex-Partner: No    Emotionally Abused: No    Physically Abused: No    Sexually Abused: No     FAMILY HISTORY:  Family History  Problem Relation Age of Onset   Pneumonia Mother    Seizures Father       REVIEW OF SYSTEMS:  Review of Systems  All other systems reviewed and are negative.   VITAL SIGNS:  Temp:  [98.6 F (37 C)-98.9 F (37.2 C)] 98.7 F (37.1 C) (05/07 1443) Pulse Rate:  [66-148] 77 (05/07 1443) Resp:  [18-19] 18 (05/07 1443) BP: (131-166)/(80-124) 151/93 (05/07 1443) SpO2:  [95 %-99 %] 95 % (05/07 1443)     Height: 5\' 2"  (157.5 cm) Weight: 71.2 kg BMI (Calculated): 28.71   INTAKE/OUTPUT:  05/06 0701 - 05/07 0700 In: 0  Out: 1300 [Urine:1300]  PHYSICAL EXAM:  Physical Exam Blood pressure (!) 151/93, pulse 77, temperature 98.7 F (37.1 C), resp. rate 18, height 5\' 2"  (1.575  m), weight 71.2 kg, SpO2 95%. Last Weight  Most recent update: 11/25/2023  6:49 AM    Weight  71.2 kg (157 lb)             CONSTITUTIONAL: Well developed, and nourished, appropriately responsive and aware without distress.   EYES: Sclera non-icteric.   EARS, NOSE, MOUTH AND THROAT:  The oropharynx is clear. Oral mucosa is pink and moist.     Hearing is intact to voice.  NECK: Trachea is midline, and there is no jugular venous distension.  RESPIRATORY:  Lungs are clear, and breath sounds are equal bilaterally.   Normal respiratory effort without pathologic use of accessory muscles. CARDIOVASCULAR: Heart is regular in rate and rhythm.   Well perfused.  GI: The abdomen is  soft, nontender, and nondistended.  GU: Foley being placed currently. MUSCULOSKELETAL:  Symmetrical muscle tone appreciated in all four extremities. Warm without edema.  SKIN: Skin turgor is normal. No pathologic skin lesions appreciated.  Left medial proximal thigh: Exquisitely tender to the slightest palpation.  Weeping drainage from the central aspect of the necrotized dermis.\  NEUROLOGIC:  Motor and sensation appear grossly normal.  Cranial nerves are grossly without defect. PSYCH:  Alert and oriented to person, place and time. Affect is appropriate for situation.  Data Reviewed I have personally reviewed what is currently available of the patient's imaging, recent labs and medical records.    Labs:     Latest Ref Rng & Units 11/30/2023    6:35 AM 11/29/2023    4:58 AM 11/28/2023    5:45 AM  CBC  WBC 4.0 - 10.5 K/uL 4.2  3.3  6.5   Hemoglobin 12.0 - 15.0 g/dL 40.9  8.8  8.4   Hematocrit 36.0 - 46.0 % 32.1  27.2  26.1   Platelets 150 - 400 K/uL 234  136  78       Latest Ref Rng & Units 11/30/2023    6:35 AM 11/29/2023    4:58 AM 11/28/2023    5:45 AM  CMP  Glucose 70 - 99 mg/dL 83  75  66   BUN 8 - 23 mg/dL 5  8  16    Creatinine 0.44 - 1.00 mg/dL 8.11  9.14  7.82   Sodium 135 - 145 mmol/L 138  139  138    Potassium 3.5 - 5.1  mmol/L 3.5  3.4  3.6   Chloride 98 - 111 mmol/L 105  107  105   CO2 22 - 32 mmol/L 26  23  20    Calcium  8.9 - 10.3 mg/dL 7.8  7.3  7.3      Imaging studies: I have reviewed the CT imaging as noted/reported below:  CLINICAL DATA:  Sepsis.  Left upper thigh cellulitis.   EXAM: CT OF THE LOWER LEFT EXTREMITY WITH CONTRAST   TECHNIQUE: Multidetector CT imaging of the lower left extremity was performed according to the standard protocol following intravenous contrast administration.   RADIATION DOSE REDUCTION: This exam was performed according to the departmental dose-optimization program which includes automated exposure control, adjustment of the mA and/or kV according to patient size and/or use of iterative reconstruction technique.   CONTRAST:  OMNIPAQUE  IOHEXOL  300 MG/ML  SOLN   COMPARISON:  CT left femur dated 11/25/2023.   FINDINGS: Bones/Joint/Cartilage   No acute fracture or dislocation. No destructive osseous lesion. Moderate degenerative changes of the left hip with joint space narrowing and marginal osteophytosis. Degenerative changes of the pubic symphysis. Moderate degenerative changes of the left knee.   Ligaments   Ligaments are suboptimally evaluated by CT.   Soft tissue and Muscles   Persistent subcutaneous edema and fat stranding extending along the left proximal to mid medial thigh with overlying skin thickening. Increased subcutaneous edema and fat stranding along the left hip and thigh with overlying skin thickening. There is increased intramuscular edema within the left adductor compartment musculature with similar to slightly decreased stranding/edema extending along the left gracilis muscle. No discrete loculated fluid collection identified. No soft tissue gas.   Irregularity of the skin along the posterior proximal to mid left thigh is again noted and could reflect a wound.   Mild fatty infiltration of the  semimembranosus muscle is again noted. Moderate atherosclerotic vascular calcifications.   IMPRESSION: 1. Persistent subcutaneous edema and fat stranding extending along the left proximal to mid medial thigh with overlying skin thickening. Increased subcutaneous edema and fat stranding along the left hip and thigh with overlying skin thickening. These findings are compatible with cellulitis. No loculated fluid collection. 2. Increased intramuscular edema in the left adductor compartment musculature with similar to slightly decreased stranding/edema extending along the left gracilis muscle, concerning for myositis. No discrete intramuscular collection identified.     Electronically Signed   By: Mannie Seek M.D.   On: 11/29/2023 15:58  Last 24 hrs: No results found.   Assessment/Plan:  73 y.o. female with , complicated by pertinent comorbidities and immune compromise:  Patient Active Problem List   Diagnosis Date Noted   Severe sepsis (HCC) 11/25/2023   Hypothyroidism 05/24/2021   Leukopenia    COPD (chronic obstructive pulmonary disease) (HCC)    GERD (gastroesophageal reflux disease) 03/19/2021   Graves' disease 03/19/2021   Cellulitis of left thigh 10/10/2020   Chronic heart failure with preserved ejection fraction (HFpEF) (HCC) 12/14/2019   S/P autologous bone marrow transplantation (HCC) 06/29/2018   Essential hypertension 05/29/2018   Red blood cell antibody positive, compatible PRBC difficult to obtain 10/21/2017   Multiple myeloma not having achieved remission (HCC) 04/08/2017    - Agree with change of antibiotics.  - Will initiate I&D/excisional debridement first thing in the morning, with n.p.o. postmidnight.  - We discussed the role of postoperative wound care, and potential sizable wound and likely need for repetitive excisional debridements.  I believe she understands, considering the degree of her  pain and the prolonged nature of this current illness she is  anxious to proceed.  Unfortunately due to her n.p.o. status I do not believe the risk of anesthesia is worth taking in light of getting her into the OR first thing in the a.m.  - DVT prophylaxis  All of the above findings and recommendations were discussed with the patient and family(if present), and all of patient's questions were answered to their expressed satisfaction. Face-to-face time spent with the patient and accompanying care providers(if present) was 45 minutes, spent counseling, educating, and coordinating care of the patient.   Thank you for the opportunity to participate in this patient's care.   -- [name], M.D., FACS 11/30/2023, 4:17 PM

## 2023-11-30 NOTE — Assessment & Plan Note (Addendum)
 11-30-2023 chronic. Not hypoxic. Lying flat in bed.  12-01-2023 stable  12-02-2023 stable  12-03-2023 stable  12-04-2023 stable  12-05-2023 stable  12-06-2023 stable.

## 2023-11-30 NOTE — Assessment & Plan Note (Signed)
 On admission through 11-29-2023 Hypotension, tachycardia, elevated lactate 2.2 on presentation with known abscess given concern for severe sepsis.  Requiring multiple IV fluid boluses to maintain MAP greater than 65.  No vasopressor medications required at this time.  Blood pressure appears to be improving.  Continue to monitor closely.  Blood cultures drawn.  Broad-spectrum antibiotic coverage on board.  Appears to be resolving.  No longer requiring midodrine .   11-30-2023 resolved.

## 2023-11-30 NOTE — Plan of Care (Signed)

## 2023-11-30 NOTE — Assessment & Plan Note (Addendum)
 11-30-2023 stable. Pt with some swelling in left leg. Still restart low dose lasix  along with kcl supplementation.  12-01-2023 hold lasix  as her left leg swelling likely due to abscess/cellulitis.  12-02-2023 stable  12-03-2023 stable  12-04-2023 stable  12-06-2023 stable.  12-05-2023 stable

## 2023-11-30 NOTE — Subjective & Objective (Addendum)
 Patient seen and examined.  Pt without any pain in left thigh. Pt is getting out of bed and walking to bathroom. Pt has port-a-cath.  Pt states she has had IV abx at home before via port-a-cath and that her dtr knows how to give IV abx at home.  Have messaged with ID and surgery about pt going home with IV ABX and home health.

## 2023-11-30 NOTE — Hospital Course (Addendum)
 HPI: Brandi [name] is a 73 y.o. female with medical history significant of MM on chemotherapy, chronic HFpEF, HTN, hypothyroidism, presented with GI symptoms including nauseous vomiting abdominal pain.  She also complains about worsening of left leg swelling pain and drainage.   Patient has history of multiple myeloma since 2018 and has been on chemotherapy, last chemo was this past Tuesday.  Started on Wednesday, patient started to feel nauseous and cramping-like lower abdominal pain, with multiple vomiting on Wednesday but no more vomiting since Thursday.  Location of abdominal pain is rather poorly defined.  No exacerbating factors.  2 days ago she also started to notice " a boil" in the left inner thigh, gradually getting bigger and ruptured yesterday with thick yellowish pus coming out.  She has had some chills but no fever.  No chest pain shortness of breath no diarrhea denies any urinary symptoms.  Denied any active bleeding.  Significant Events: Admitted 11/25/2023 cellulitis 12-01-2023 operative I&D left thigh abscess 12-04-2023 operative cultures growing MRSA, morganella, E. Coli  Significant Labs: WBC 3.5, HgB 11.6, plt 63 Na 136, K 2.4, CO2 of 18, BUN 19, Scr 0.91. glu 90 Ca 7.7, TP 6.6, alb 3.8, AST 18, ALT 10, alk phos 64 Mg 1.5  Significant Imaging Studies: CT abd/pelvis, CTPA No CT evidence for acute pulmonary embolus. 2. No acute findings in the abdomen or pelvis. Specifically, no findings to explain the patient's history of abdominal pain. 3. Endometrial stripe appears thickened at 13 mm on sagittal imaging. Correlation for abnormal vaginal bleeding recommended. Follow-up pelvic ultrasound recommended to further evaluate as endometrial neoplasm could have this appearance. 4. Calcified uterine fibroids. 5. Two 2 mm left lung nodules.  CT left femur Moderate medial and posteromedial and mild anteromedial proximal to mid left thigh subcutaneous fat edema and swelling. Moderate  thickening of the regional left thigh skin. No walled-off fluid collection is seen to indicate an abscess. 2. Mild focal thickening/irregularity of the skin at the posterior aspect of the proximal to mid left thigh. This may represent the region of the reported posterior thigh weeping wound. 3. No high-grade fascial thickening, subcutaneous air, or perifascial air is seen to suggest CT evidence of necrotizing fasciitis. 4. No definite destructive lytic lesion is seen within the left femur in this patient with history of multiple myeloma. 5. 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 CT head Left middle ear and mastoid opacification is new since 2022. Consider with Acute Otitis Media with mastoid effusion. 2. 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 CXR Upper zone pulmonary vascular prominence raises the possibility of pulmonary venous hypertension. 2. Suspected linear/bandlike subsegmental atelectasis or scarring at the left lung base. 3. Aortic Atherosclerosis 11-29-2023 CT left femur Persistent subcutaneous edema and fat stranding extending along the left proximal to mid medial thigh with overlying skin thickening. Increased subcutaneous edema and fat stranding along the left hip and thigh with overlying skin thickening. These findings are compatible with cellulitis. No loculated fluid collection. 2. Increased intramuscular edema in the left adductor  compartment musculature with similar to slightly decreased stranding/edema extending along the left gracilis muscle, concerning for myositis. No discrete intramuscular collection identified  Antibiotic Therapy: Anti-infectives (From admission, onward)    Start     Dose/Rate Route Frequency Ordered Stop   11/27/23 0900  cefTRIAXone  (ROCEPHIN ) 2 g in sodium chloride  0.9 % 100 mL IVPB  2 g 200 mL/hr over  30 Minutes Intravenous Every 24 hours 11/27/23 0747 12/02/23 0859   11/26/23 1445  azithromycin  (ZITHROMAX ) tablet 500 mg        500 mg Oral Daily 11/26/23 1444 12/01/23 0959   11/26/23 1000  vancomycin  (VANCOCIN ) IVPB 1000 mg/200 mL premix        1,000 mg 200 mL/hr over 60 Minutes Intravenous Every 24 hours 11/25/23 1303 11/29/23 1232   11/25/23 1700  ceFAZolin  (ANCEF ) IVPB 1 g/50 mL premix  Status:  Discontinued        1 g 100 mL/hr over 30 Minutes Intravenous Every 8 hours 11/25/23 1307 11/27/23 0747   11/25/23 1330  vancomycin  (VANCOREADY) IVPB 500 mg/100 mL        500 mg 100 mL/hr over 60 Minutes Intravenous  Once 11/25/23 1247 11/25/23 1523   11/25/23 0845  piperacillin -tazobactam (ZOSYN ) IVPB 3.375 g        3.375 g 100 mL/hr over 30 Minutes Intravenous  Once 11/25/23 0830 11/25/23 0934   11/25/23 0830  cefTRIAXone  (ROCEPHIN ) 2 g in sodium chloride  0.9 % 100 mL IVPB  Status:  Discontinued        2 g 200 mL/hr over 30 Minutes Intravenous Once 11/25/23 0817 11/25/23 0830   11/25/23 0830  vancomycin  (VANCOCIN ) IVPB 1000 mg/200 mL premix        1,000 mg 200 mL/hr over 60 Minutes Intravenous  Once 11/25/23 0817 11/25/23 1059       Procedures: 12-01-2023 I&D left medial thigh abscess  Consultants: ID General Surgery

## 2023-11-30 NOTE — Progress Notes (Signed)
 Pt's heart rate was sustaining at 140s to 150s bpm. Went to the pt's room, pt was asleep and easily arousable. Pt complained of 8/10 pain on her left thigh, where the abscess is. PRN Tramadol  given. NP Ouma made aware via secure chat.   0007H: 12L EKG done. NP Ouma at bedside. 0015H: Metoprolol  5mg  IV given. HR went down to 136 bpm. BP is 145/107. NP Ouma made aware. Pt's pain scale is at 6/10 now.

## 2023-12-01 ENCOUNTER — Inpatient Hospital Stay: Admitting: Anesthesiology

## 2023-12-01 ENCOUNTER — Encounter
Admission: EM | Disposition: A | Discharge status: Home Health | Payer: Self-pay | Source: Home / Self Care | Attending: Internal Medicine

## 2023-12-01 ENCOUNTER — Encounter: Payer: Self-pay | Admitting: Internal Medicine

## 2023-12-01 ENCOUNTER — Other Ambulatory Visit: Payer: Self-pay

## 2023-12-01 DIAGNOSIS — C9002 Multiple myeloma in relapse: Secondary | ICD-10-CM | POA: Diagnosis not present

## 2023-12-01 DIAGNOSIS — L0291 Cutaneous abscess, unspecified: Secondary | ICD-10-CM

## 2023-12-01 DIAGNOSIS — L02416 Cutaneous abscess of left lower limb: Secondary | ICD-10-CM | POA: Diagnosis not present

## 2023-12-01 DIAGNOSIS — A419 Sepsis, unspecified organism: Secondary | ICD-10-CM | POA: Diagnosis not present

## 2023-12-01 DIAGNOSIS — I10 Essential (primary) hypertension: Secondary | ICD-10-CM | POA: Diagnosis not present

## 2023-12-01 DIAGNOSIS — L03116 Cellulitis of left lower limb: Secondary | ICD-10-CM | POA: Diagnosis not present

## 2023-12-01 HISTORY — PX: INCISION AND DRAINAGE ABSCESS: SHX5864

## 2023-12-01 LAB — GLUCOSE, CAPILLARY
Glucose-Capillary: 77 mg/dL (ref 70–99)
Glucose-Capillary: 79 mg/dL (ref 70–99)
Glucose-Capillary: 85 mg/dL (ref 70–99)

## 2023-12-01 SURGERY — INCISION AND DRAINAGE, ABSCESS
Anesthesia: General | Site: Thigh | Laterality: Left

## 2023-12-01 MED ORDER — FENTANYL CITRATE (PF) 100 MCG/2ML IJ SOLN
INTRAMUSCULAR | Status: AC
  Filled 2023-12-01: qty 2

## 2023-12-01 MED ORDER — ACETAMINOPHEN 10 MG/ML IV SOLN: 1000.0000 mg | Freq: Once | INTRAVENOUS | Status: DC | PRN

## 2023-12-01 MED ORDER — OXYCODONE HCL 5 MG/5ML PO SOLN: 5.0000 mg | Freq: Once | ORAL | Status: AC | PRN

## 2023-12-01 MED ORDER — ROCURONIUM BROMIDE 10 MG/ML (PF) SYRINGE
PREFILLED_SYRINGE | INTRAVENOUS | Status: AC
  Filled 2023-12-01: qty 10

## 2023-12-01 MED ORDER — OXYCODONE HCL 5 MG PO TABS
5.0000 mg | ORAL_TABLET | ORAL | Status: DC | PRN
  Filled 2023-12-01: qty 1

## 2023-12-01 MED ORDER — PROPOFOL 500 MG/50ML IV EMUL
INTRAVENOUS | Status: DC | PRN
Start: 2023-12-01 — End: 2023-12-01
  Administered 2023-12-01: 80 ug/kg/min via INTRAVENOUS

## 2023-12-01 MED ORDER — PROPOFOL 10 MG/ML IV BOLUS
INTRAVENOUS | Status: DC | PRN
  Administered 2023-12-01: 70 mg via INTRAVENOUS
  Administered 2023-12-01 (×2): 30 mg via INTRAVENOUS

## 2023-12-01 MED ORDER — LIDOCAINE HCL (PF) 2 % IJ SOLN
INTRAMUSCULAR | Status: AC
  Filled 2023-12-01: qty 5

## 2023-12-01 MED ORDER — PROPOFOL 10 MG/ML IV BOLUS
INTRAVENOUS | Status: AC
  Filled 2023-12-01: qty 20

## 2023-12-01 MED ORDER — BUPIVACAINE LIPOSOME 1.3 % IJ SUSP
INTRAMUSCULAR | Status: AC
  Filled 2023-12-01: qty 20

## 2023-12-01 MED ORDER — DEXAMETHASONE SODIUM PHOSPHATE 10 MG/ML IJ SOLN
INTRAMUSCULAR | Status: AC
  Filled 2023-12-01: qty 1

## 2023-12-01 MED ORDER — BUPIVACAINE-EPINEPHRINE 0.25% -1:200000 IJ SOLN
INTRAMUSCULAR | Status: DC | PRN
  Administered 2023-12-01: 50 mL

## 2023-12-01 MED ORDER — FENTANYL CITRATE (PF) 100 MCG/2ML IJ SOLN
25.0000 ug | INTRAMUSCULAR | Status: DC | PRN
  Administered 2023-12-01: 25 ug via INTRAVENOUS

## 2023-12-01 MED ORDER — VASHE WOUND IRRIGATION OPTIME
TOPICAL | Status: DC | PRN
  Administered 2023-12-01: 34 [oz_av]

## 2023-12-01 MED ORDER — PROPOFOL 10 MG/ML IV BOLUS
INTRAVENOUS | Status: AC
Start: 2023-12-01 — End: ?
  Filled 2023-12-01: qty 40

## 2023-12-01 MED ORDER — HYDROMORPHONE HCL 1 MG/ML IJ SOLN
1.0000 mg | INTRAMUSCULAR | Status: DC | PRN
  Administered 2023-12-05 (×2): 1 mg via INTRAVENOUS
  Filled 2023-12-01 (×2): qty 1

## 2023-12-01 MED ORDER — BUPIVACAINE-EPINEPHRINE (PF) 0.25% -1:200000 IJ SOLN
INTRAMUSCULAR | Status: AC
  Filled 2023-12-01: qty 30

## 2023-12-01 MED ORDER — OXYCODONE HCL 5 MG PO TABS
5.0000 mg | ORAL_TABLET | Freq: Once | ORAL | Status: AC | PRN
  Administered 2023-12-01: 5 mg via ORAL

## 2023-12-01 MED ORDER — SODIUM CHLORIDE 0.9 % IR SOLN
Status: DC | PRN
  Administered 2023-12-01: 3000 mL

## 2023-12-01 MED ORDER — OXYCODONE HCL 5 MG PO TABS
ORAL_TABLET | ORAL | Status: AC
  Filled 2023-12-01: qty 1

## 2023-12-01 MED ORDER — ACETAMINOPHEN 10 MG/ML IV SOLN
INTRAVENOUS | Status: AC
  Filled 2023-12-01: qty 100

## 2023-12-01 MED ORDER — OXYCODONE HCL 5 MG PO TABS
10.0000 mg | ORAL_TABLET | ORAL | Status: DC | PRN
  Administered 2023-12-01 – 2023-12-05 (×6): 10 mg via ORAL
  Filled 2023-12-01 (×6): qty 2

## 2023-12-01 MED ORDER — ONDANSETRON HCL 4 MG/2ML IJ SOLN
INTRAMUSCULAR | Status: AC
  Filled 2023-12-01: qty 2

## 2023-12-01 MED ORDER — FENTANYL CITRATE (PF) 100 MCG/2ML IJ SOLN
INTRAMUSCULAR | Status: DC | PRN
  Administered 2023-12-01 (×3): 25 ug via INTRAVENOUS
  Administered 2023-12-01 (×2): 12.5 ug via INTRAVENOUS

## 2023-12-01 MED ORDER — ONDANSETRON HCL 4 MG/2ML IJ SOLN: 4.0000 mg | Freq: Once | INTRAMUSCULAR | Status: DC | PRN

## 2023-12-01 MED ORDER — ENOXAPARIN SODIUM 40 MG/0.4ML IJ SOSY
40.0000 mg | PREFILLED_SYRINGE | INTRAMUSCULAR | Status: DC
  Administered 2023-12-02 – 2023-12-05 (×4): 40 mg via SUBCUTANEOUS
  Filled 2023-12-01 (×4): qty 0.4

## 2023-12-01 MED ORDER — LACTATED RINGERS IV SOLN: INTRAVENOUS | Status: DC | PRN

## 2023-12-01 MED ORDER — ACETAMINOPHEN 10 MG/ML IV SOLN
INTRAVENOUS | Status: DC | PRN
  Administered 2023-12-01: 1000 mg via INTRAVENOUS

## 2023-12-01 SURGICAL SUPPLY — 21 items
BLADE SURG 15 STRL LF DISP TIS (BLADE) ×1 IMPLANT
CLEANSER WND VASHE 34 (WOUND CARE) IMPLANT
DRAPE LAPAROTOMY 77X122 PED (DRAPES) ×1 IMPLANT
ELECTRODE REM PT RTRN 9FT ADLT (ELECTROSURGICAL) ×1 IMPLANT
GAUZE SPONGE 4X4 12PLY STRL (GAUZE/BANDAGES/DRESSINGS) IMPLANT
GLOVE ORTHO TXT STRL SZ7.5 (GLOVE) ×1 IMPLANT
GOWN STRL REUS W/ TWL LRG LVL3 (GOWN DISPOSABLE) ×1 IMPLANT
GOWN STRL REUS W/ TWL XL LVL3 (GOWN DISPOSABLE) ×1 IMPLANT
GRADUATE 1200CC STRL 31836 (MISCELLANEOUS) IMPLANT
KIT TURNOVER KIT A (KITS) ×1 IMPLANT
MANIFOLD NEPTUNE II (INSTRUMENTS) ×1 IMPLANT
NDL HYPO 22X1.5 SAFETY MO (MISCELLANEOUS) IMPLANT
NEEDLE HYPO 22X1.5 SAFETY MO (MISCELLANEOUS) ×1 IMPLANT
PACK BASIN MINOR ARMC (MISCELLANEOUS) ×1 IMPLANT
PAD ABD DERMACEA PRESS 5X9 (GAUZE/BANDAGES/DRESSINGS) IMPLANT
SOLUTION PREP PVP 2OZ (MISCELLANEOUS) ×1 IMPLANT
SPONGE T-LAP 18X18 ~~LOC~~+RFID (SPONGE) IMPLANT
SWAB CULTURE AMIES ANAERIB BLU (MISCELLANEOUS) IMPLANT
SYR 10ML LL (SYRINGE) IMPLANT
TIP FAN IRRIG PULSAVAC PLUS (DISPOSABLE) IMPLANT
TRAP FLUID SMOKE EVACUATOR (MISCELLANEOUS) ×1 IMPLANT

## 2023-12-01 NOTE — Interval H&P Note (Signed)
 History and Physical Interval Note:  12/01/2023 7:20 AM  Brandi [name]  has presented today for surgery, with the diagnosis of necrotizing cellulitis left medial/proximal thigh..  The various methods of treatment have been discussed with the patient and family. After consideration of risks, benefits and other options for treatment, the patient has consented to  Procedure(s): INCISION AND DRAINAGE, ABSCESS (Left) as a surgical intervention.  The patient's history has been reviewed, patient examined, no change in status, stable for surgery.  I have reviewed the patient's chart and labs.  Questions were answered to the patient's satisfaction.   The left thigh is marked.   [name]

## 2023-12-01 NOTE — Assessment & Plan Note (Addendum)
 12-01-2023 immunosuppressed due to MM.  12-02-2023 stable  12-03-2023 stable  12-04-2023 stable  12-05-2023 stable  12-06-2023 stable. Will need to delay any chemo until after her IV ABX are completed.  She asked that I notify her oncologist about her admission. I have sent secure chat to Dr. Valentine Gasmen.

## 2023-12-01 NOTE — Progress Notes (Signed)
 PROGRESS NOTE    REOLA SCHUENKE  ZOX:[id number] DOB: 04-22-1951 DOA: 11/25/2023 PCP: [name], MD  Subjective: Patient seen and examined.  Went to OR today. Had 8x8x2 abscess/necrotic tissue removed from left medial thigh.   Hospital Course: HPI: Brandi [name] is a 73 y.o. female with medical history significant of MM on chemotherapy, chronic HFpEF, HTN, hypothyroidism, presented with GI symptoms including nauseous vomiting abdominal pain.  She also complains about worsening of left leg swelling pain and drainage.   Patient has history of multiple myeloma since 2018 and has been on chemotherapy, last chemo was this past Tuesday.  Started on Wednesday, patient started to feel nauseous and cramping-like lower abdominal pain, with multiple vomiting on Wednesday but no more vomiting since Thursday.  Location of abdominal pain is rather poorly defined.  No exacerbating factors.  2 days ago she also started to notice " a boil" in the left inner thigh, gradually getting bigger and ruptured yesterday with thick yellowish pus coming out.  She has had some chills but no fever.  No chest pain shortness of breath no diarrhea denies any urinary symptoms.  Denied any active bleeding.  Significant Events: Admitted 11/25/2023 cellulitis   Significant Labs: WBC 3.5, HgB 11.6, plt 63 Na 136, K 2.4, CO2 of 18, BUN 19, Scr 0.91. glu 90 Ca 7.7, TP 6.6, alb 3.8, AST 18, ALT 10, alk phos 64 Mg 1.5  Significant Imaging Studies: CT abd/pelvis, CTPA No CT evidence for acute pulmonary embolus. 2. No acute findings in the abdomen or pelvis. Specifically, no findings to explain the patient's history of abdominal pain. 3. Endometrial stripe appears thickened at 13 mm on sagittal imaging. Correlation for abnormal vaginal bleeding recommended. Follow-up pelvic ultrasound recommended to further evaluate as endometrial neoplasm could have this appearance. 4. Calcified uterine fibroids. 5. Two 2 mm left lung  nodules.  CT left femur Moderate medial and posteromedial and mild anteromedial proximal to mid left thigh subcutaneous fat edema and swelling. Moderate thickening of the regional left thigh skin. No walled-off fluid collection is seen to indicate an abscess. 2. Mild focal thickening/irregularity of the skin at the posterior aspect of the proximal to mid left thigh. This may represent the region of the reported posterior thigh weeping wound. 3. No high-grade fascial thickening, subcutaneous air, or perifascial air is seen to suggest CT evidence of necrotizing fasciitis. 4. No definite destructive lytic lesion is seen within the left femur in this patient with history of multiple myeloma. 5. 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 CT head Left middle ear and mastoid opacification is new since 2022. Consider with Acute Otitis Media with mastoid effusion. 2. 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 CXR Upper zone pulmonary vascular prominence raises the possibility of pulmonary venous hypertension. 2. Suspected linear/bandlike subsegmental atelectasis or scarring at the left lung base. 3. Aortic Atherosclerosis 11-29-2023 CT left femur Persistent subcutaneous edema and fat stranding extending along the left proximal to mid medial thigh with overlying skin thickening. Increased subcutaneous edema and fat stranding along the left hip and thigh with overlying skin thickening. These findings are compatible with cellulitis. No loculated fluid collection. 2. Increased intramuscular edema in the left adductor  compartment musculature with similar to slightly decreased stranding/edema extending along the left gracilis muscle, concerning for myositis. No discrete intramuscular collection identified  Antibiotic Therapy: Anti-infectives (From admission, onward)  Start      Dose/Rate Route Frequency Ordered Stop   11/27/23 0900  cefTRIAXone  (ROCEPHIN ) 2 g in sodium chloride  0.9 % 100 mL IVPB        2 g 200 mL/hr over 30 Minutes Intravenous Every 24 hours 11/27/23 0747 12/02/23 0859   11/26/23 1445  azithromycin  (ZITHROMAX ) tablet 500 mg        500 mg Oral Daily 11/26/23 1444 12/01/23 0959   11/26/23 1000  vancomycin  (VANCOCIN ) IVPB 1000 mg/200 mL premix        1,000 mg 200 mL/hr over 60 Minutes Intravenous Every 24 hours 11/25/23 1303 11/29/23 1232   11/25/23 1700  ceFAZolin  (ANCEF ) IVPB 1 g/50 mL premix  Status:  Discontinued        1 g 100 mL/hr over 30 Minutes Intravenous Every 8 hours 11/25/23 1307 11/27/23 0747   11/25/23 1330  vancomycin  (VANCOREADY) IVPB 500 mg/100 mL        500 mg 100 mL/hr over 60 Minutes Intravenous  Once 11/25/23 1247 11/25/23 1523   11/25/23 0845  piperacillin -tazobactam (ZOSYN ) IVPB 3.375 g        3.375 g 100 mL/hr over 30 Minutes Intravenous  Once 11/25/23 0830 11/25/23 0934   11/25/23 0830  cefTRIAXone  (ROCEPHIN ) 2 g in sodium chloride  0.9 % 100 mL IVPB  Status:  Discontinued        2 g 200 mL/hr over 30 Minutes Intravenous Once 11/25/23 0817 11/25/23 0830   11/25/23 0830  vancomycin  (VANCOCIN ) IVPB 1000 mg/200 mL premix        1,000 mg 200 mL/hr over 60 Minutes Intravenous  Once 11/25/23 0817 11/25/23 1059       Procedures: 12-01-2023 I&D left medial thigh abscess  Consultants: ID General Surgery    Assessment and Plan: * Sepsis (HCC) On admission through 11-29-2023 Hypotension, tachycardia, elevated lactate 2.2 on presentation with known abscess given concern for severe sepsis.  Requiring multiple IV fluid boluses to maintain MAP greater than 65.  No vasopressor medications required at this time.  Blood pressure appears to be improving.  Continue to monitor closely.  Blood cultures drawn.  Broad-spectrum antibiotic coverage on board.  Appears to be resolving.  No longer requiring midodrine .   11-30-2023  resolved.   Cellulitis of left thigh On admission through 11-29-2023 CT scan on presentation noting mostly medial left thigh subcutaneous edema, but no walled off fluid collection to indicate abscess.  Likely etiology of patient's sepsis.  Broad-spectrum antibiotic coverage on board initially cefazolin  and vancomycin .  Transitioning to ceftriaxone  2 g every 24 hours secondary to pneumonia coverage on 5/4.  Small weeping area with induration, daily dressing change.  Will order wound care consult.  Given continued induration induration and tenderness, will repeat CT of the area to assess for developed underlying abscess.  If abscess is present, will likely need to get general surgery involved for I&D.    11-30-2023 repeat CT femur yesterday still shows no abscess. Pt has been on IV rocephin /vanco for 5 days now. While her labs are improved, pt still with clinically significant cellulitis. Will ask ID consult to see patient to see if there are any abx changes that need to be made.  I don't see good anaerobic coverage right now but for a medial thigh cellulitis, I'm not sure what organisms to target.  12-01-2023 s/p I&D in OR today. 8 x 8 x 2 cm abscess/necrotic tissue was drained/debrided in OR. Cultures taken. IV dilaudid prn ordered. Remains on IV  zosyn  and po zyvox.   COPD (chronic obstructive pulmonary disease) (HCC) 11-30-2023 not convinced she has pneumonia. Pt does have hx of COPD. On IV rocephin . Not wheezing or hypoxic.  12-01-2023 stable   Chronic heart failure with preserved ejection fraction (HFpEF) (HCC) 11-30-2023 chronic. Not hypoxic. Lying flat in bed.  12-01-2023 stable  Essential hypertension 11-30-2023 stable. Pt with some swelling in left leg. Still restart low dose lasix  along with kcl supplementation.  12-01-2023 hold lasix  as her left leg swelling likely due to abscess/cellulitis.  Hypokalemia-resolved as of 11/30/2023 Treated and resolved.  Multiple myeloma in relapse  (HCC) 12-01-2023 immunosuppressed due to MM.  DVT prophylaxis: enoxaparin  (LOVENOX ) injection 40 mg Start: 12/01/23 1800    Code Status: Full Code Family Communication: no family at bedside Disposition Plan: unknown Reason for continuing need for hospitalization: remains on IV ABX. Just had surgery today.  Objective: Vitals:   12/01/23 0916 12/01/23 0924 12/01/23 0930 12/01/23 0951  BP: 129/81  (!) 151/82 139/80  Pulse: 60 64 61 66  Resp: 16 11 14 18   Temp: 98.2 F (36.8 C)   97.7 F (36.5 C)  TempSrc:    Oral  SpO2: 97% 97% 97% 97%  Weight:      Height:        Intake/Output Summary (Last 24 hours) at 12/01/2023 1524 Last data filed at 12/01/2023 0843 Gross per 24 hour  Intake 300 ml  Output 700 ml  Net -400 ml   Filed Weights   11/25/23 0649 12/01/23 0702  Weight: 71.2 kg 71.2 kg    Examination:  Physical Exam Vitals and nursing note reviewed.  Constitutional:      General: She is not in acute distress.    Appearance: She is not toxic-appearing or diaphoretic.  HENT:     Head: Normocephalic and atraumatic.  Cardiovascular:     Rate and Rhythm: Normal rate.     Pulses: Normal pulses.  Pulmonary:     Effort: Pulmonary effort is normal.     Breath sounds: Normal breath sounds.  Abdominal:     General: Abdomen is flat. Bowel sounds are normal.  Skin:    Capillary Refill: Capillary refill takes less than 2 seconds.     Comments: Left thigh in bulky dressing  Neurological:     General: No focal deficit present.     Mental Status: She is alert and oriented to person, place, and time.     Data Reviewed: I have personally reviewed following labs and imaging studies  CBC: Recent Labs  Lab 11/25/23 0703 11/26/23 0136 11/26/23 0520 11/27/23 0525 11/28/23 0545 11/29/23 0458 11/30/23 0635  WBC 3.5* 10.7* 9.7 10.7* 6.5 3.3* 4.2  NEUTROABS 2.4 9.4*  --   --   --   --   --   HGB 11.6* 9.5* 8.5* 8.0* 8.4* 8.8* 10.2*  HCT 36.3 30.4* 26.7* 24.3* 26.1* 27.2*  32.1*  MCV 87.1 87.6 87.5 85.3 86.1 86.3 85.8  PLT 63* 27* 22* 32* 78* 136* 234   Basic Metabolic Panel: Recent Labs  Lab 11/25/23 0929 11/25/23 2004 11/26/23 0520 11/26/23 1412 11/27/23 0525 11/28/23 0545 11/29/23 0458 11/30/23 0635  NA  --    < >  --  138 140 138 139 138  K  --    < >  --  3.1* 3.7 3.6 3.4* 3.5  CL  --    < >  --  109 109 105 107 105  CO2  --    < >  --  22 22 20* 23 26  GLUCOSE  --    < >  --  91 70 66* 75 83  BUN  --    < >  --  25* 23 16 8  <5*  CREATININE  --    < >  --  0.90 0.82 0.70 0.55 0.71  CALCIUM   --    < >  --  6.5* 7.2* 7.3* 7.3* 7.8*  MG  --    < > 2.1  --  2.2 2.2 2.3 2.0  PHOS 2.2*  --  2.9  --   --  2.9  --   --    < > = values in this interval not displayed.   GFR: Estimated Creatinine Clearance: 59.6 mL/min (by C-G formula based on SCr of 0.71 mg/dL). Liver Function Tests: Recent Labs  Lab 11/25/23 0703 11/26/23 0018 11/28/23 0545  AST 18 12*  --   ALT 10 9  --   ALKPHOS 64 44  --   BILITOT 1.5* 0.9  --   PROT 6.6 4.8*  --   ALBUMIN 3.8 2.7* 2.8*   Recent Labs  Lab 11/25/23 0703  LIPASE 21   BNP (last 3 results) Recent Labs    03/30/23 0918  BNP 17.5   CBG: Recent Labs  Lab 11/30/23 0752 11/30/23 1136 11/30/23 2022 12/01/23 0027 12/01/23 0320  GLUCAP 92 90 96 79 77   Sepsis Labs: Recent Labs  Lab 11/25/23 0703 11/25/23 0929 11/26/23 0520  LATICACIDVEN 3.5* 2.1* 2.2*    Recent Results (from the past 240 hours)  Blood culture (routine x 2)     Status: None   Collection Time: 11/25/23  7:03 AM   Specimen: BLOOD  Result Value Ref Range Status   Specimen Description BLOOD RIGHT ANTECUBITAL  Final   Special Requests   Final    BOTTLES DRAWN AEROBIC AND ANAEROBIC Blood Culture adequate volume   Culture   Final    NO GROWTH 5 DAYS Performed at [facility], [address] [place], [place] [address]    Report Status 11/30/2023 FINAL  Final  Blood culture (routine x 2)     Status: None    Collection Time: 11/25/23  9:29 AM   Specimen: BLOOD  Result Value Ref Range Status   Specimen Description BLOOD BLOOD RIGHT HAND  Final   Special Requests   Final    BOTTLES DRAWN AEROBIC AND ANAEROBIC Blood Culture adequate volume   Culture   Final    NO GROWTH 5 DAYS Performed at [facility], [address], [place], [place] [address]    Report Status 11/30/2023 FINAL  Final  MRSA Next Gen by PCR, Nasal     Status: None   Collection Time: 11/25/23 12:59 PM   Specimen: Nasal Mucosa; Nasal Swab  Result Value Ref Range Status   MRSA by PCR Next Gen NOT DETECTED NOT DETECTED Final    Comment: (NOTE) The GeneXpert MRSA Assay (FDA approved for NASAL specimens only), is one component of a comprehensive MRSA colonization surveillance program. It is not intended to diagnose MRSA infection nor to guide or monitor treatment for MRSA infections. Test performance is not FDA approved in patients less than 85 years old. Performed at [facility], [address] [place], [place] [address]   Group A Strep by PCR     Status: None   Collection Time: 11/25/23 12:59 PM   Specimen: Nasal Mucosa; Sterile Swab  Result Value Ref  Range Status   Group A Strep by PCR NOT DETECTED NOT DETECTED Final    Comment: Performed at [facility] [address] [place], [place] [address]  Expectorated Sputum Assessment w Gram Stain, Rflx to Resp Cult     Status: None   Collection Time: 11/27/23 12:15 PM   Specimen: Sputum  Result Value Ref Range Status   Specimen Description SPUTUM  Final   Special Requests NONE  Final   Sputum evaluation   Final    THIS SPECIMEN IS ACCEPTABLE FOR SPUTUM CULTURE Performed at [facility], [address], [place], [place] [address]    Report Status 11/27/2023 FINAL  Final  Culture, Respiratory w Gram Stain     Status: None   Collection Time: 11/27/23 12:15 PM   Specimen: SPU  Result Value Ref Range Status   Specimen Description    Final    SPUTUM Performed at [facility], [address], [place], [place] [address]    Special Requests   Final    NONE Reflexed from [id number] Performed at [facility], [address] [place], [place] [address]    Gram Stain   Final    RARE WBC PRESENT, PREDOMINANTLY PMN FEW GRAM POSITIVE COCCI    Culture   Final    Normal respiratory flora-no Staph aureus or Pseudomonas seen Performed at [facility], [address], [place], [place] [address]    Report Status 11/29/2023 FINAL  Final  Aerobic Culture w Gram Stain (superficial specimen)     Status: None (Preliminary result)   Collection Time: 11/30/23  4:00 PM   Specimen: Wound  Result Value Ref Range Status   Specimen Description   Final    WOUND Performed at [facility], [address], [place], [place] [address]    Special Requests   Final     THIGH Performed at [facility], [address] [place], [place] [address]    Gram Stain NO WBC SEEN RARE GRAM NEGATIVE RODS   Final   Culture   Final    ABUNDANT GRAM NEGATIVE RODS ABUNDANT STAPHYLOCOCCUS AUREUS CULTURE REINCUBATED FOR BETTER GROWTH Performed at [facility], [address], [place], [place] [address]    Report Status PENDING  Incomplete  Aerobic/Anaerobic Culture w Gram Stain (surgical/deep wound)     Status: None (Preliminary result)   Collection Time: 12/01/23  8:19 AM   Specimen: Wound; Abscess  Result Value Ref Range Status   Specimen Description   Final    WOUND Performed at [facility], [address] [place], [place] [address]    Special Requests   Final    NONE Performed at [facility], [address] [place], [place] [address]    Gram Stain   Final    NO WBC SEEN RARE GRAM POSITIVE COCCI IN PAIRS Performed at [facility], [address], [place], [place] [address]    Culture PENDING  Incomplete   Report Status PENDING  Incomplete    Scheduled Meds:   aspirin  EC  81 mg Oral Daily   Chlorhexidine  Gluconate Cloth  6 each Topical Daily   enoxaparin  (LOVENOX ) injection  40 mg Subcutaneous Q24H   linezolid  600 mg Oral Q12H   montelukast   10 mg Oral QHS   pantoprazole   40 mg Oral Daily   Continuous Infusions:  piperacillin -tazobactam (ZOSYN )  IV 3.375 g (12/01/23 1320)     LOS:  6 days   Time spent: 55 minutes  Unk Garb, DO  [facility]  12/01/2023, 3:24 PM

## 2023-12-01 NOTE — Plan of Care (Signed)
  Problem: Education: Goal: Knowledge of General Education information will improve Description: Including pain rating scale, medication(s)/side effects and non-pharmacologic comfort measures Outcome: Progressing   Problem: Clinical Measurements: Goal: Will remain free from infection Outcome: Progressing   Problem: Clinical Measurements: Goal: Respiratory complications will improve Outcome: Progressing   Problem: Clinical Measurements: Goal: Cardiovascular complication will be avoided Outcome: Progressing   Problem: Elimination: Goal: Will not experience complications related to bowel motility Outcome: Progressing   Problem: Elimination: Goal: Will not experience complications related to urinary retention Outcome: Progressing   Problem: Pain Managment: Goal: General experience of comfort will improve and/or be controlled Outcome: Progressing   Problem: Safety: Goal: Ability to remain free from injury will improve Outcome: Progressing

## 2023-12-01 NOTE — Anesthesia Postprocedure Evaluation (Signed)
 Anesthesia Post Note  Patient: Brandi [name]  Procedure(s) Performed: INCISION AND DRAINAGE, ABSCESS (Left: Thigh)  Patient location during evaluation: PACU Anesthesia Type: General Level of consciousness: awake and alert Pain management: pain level controlled Vital Signs Assessment: post-procedure vital signs reviewed and stable Respiratory status: spontaneous breathing, nonlabored ventilation, respiratory function stable and patient connected to nasal cannula oxygen Cardiovascular status: blood pressure returned to baseline and stable Postop Assessment: no apparent nausea or vomiting Anesthetic complications: no   No notable events documented.   Last Vitals:  Vitals:   12/01/23 0930 12/01/23 0951  BP: (!) 151/82 139/80  Pulse: 61 66  Resp: 14 18  Temp:  36.5 C  SpO2: 97% 97%    Last Pain:  Vitals:   12/01/23 0951  TempSrc: Oral  PainSc:                  Lattie Poli

## 2023-12-01 NOTE — Transfer of Care (Signed)
 Immediate Anesthesia Transfer of Care Note  Patient: Brandi [name]  Procedure(s) Performed: INCISION AND DRAINAGE, ABSCESS (Left: Thigh)  Patient Location: PACU  Anesthesia Type:MAC  Level of Consciousness: drowsy and patient cooperative  Airway & Oxygen Therapy: Patient Spontanous Breathing and Patient connected to face mask oxygen  Post-op Assessment: Report given to RN and Post -op Vital signs reviewed and stable  Post vital signs: Reviewed and stable  Last Vitals:  Vitals Value Taken Time  BP 118/72 12/01/23 0852  Temp    Pulse 73 12/01/23 0854  Resp 20 12/01/23 0854  SpO2 98 % 12/01/23 0854  Vitals shown include unfiled device data.  Last Pain:  Vitals:   12/01/23 0702  TempSrc: Temporal  PainSc: 0-No pain      Patients Stated Pain Goal: 0 (12/01/23 0865)  Complications: No notable events documented.

## 2023-12-01 NOTE — Op Note (Signed)
 Incision and drainage/excisional debridement of skin subcutaneous tissues and fascia left proximal/medial thigh  Pre-operative Diagnosis: Necrotizing cellulitis left proximal medial thigh  Post-operative Diagnosis: same.   Surgeon: [name], M.D., FACS  Anesthesia: MAC  Findings: Extensive multilobulated soft tissue necrosis, seropurulent edematous soft tissue dissection in various directions from the dermal necrotic presentation.   Estimated Blood Loss: 25 mL         Specimens: Skin subcutaneous tissues and drainage.          Complications: none              Procedure Details  The patient was seen again in the Holding Room. The benefits, complications, treatment options, and expected outcomes were discussed with the patient. The risks of bleeding, infection, recurrence of symptoms, failure to resolve symptoms, unanticipated injury, prosthetic placement, prosthetic infection, any of which could require further surgery were reviewed with the patient. The likelihood of improving the patient's symptoms with return to their baseline status is hopeful, however she will have a large open wound to deal with dressing changes, and IV antibiotics to ward off persisting or recurring infection..  The patient and/or family concurred with the proposed plan, giving informed consent.  The patient was taken to Operating Room, identified and the procedure verified.    Prior to the induction of general anesthesia, antibiotic prophylaxis was administered. VTE prophylaxis was in place.  MAC, propofol  sedation was then administered and tolerated well. After the induction, the patient was positioned in the lithotomy position yellowfin stirrups and the left medial proximal thigh was prepped with Betadine and draped in the sterile fashion.  A Time Out was held and the above information confirmed.  Sequential excisions of the obvious necrotic dermis was completed, fluid and soft tissues obtained for culture  and sensitivity.  After complete unroofing of the clearly necrotic dermis blunt tissue dissection was completed with fingers to identify all the necrotic foci, extension of the necrotic/inflammatory process, and determining the degrees of undermining and the extent of the infection.  This clearly extended down to the gracilis and the and adductors.  I opened up the muscle fascia to ensure that there was no contained infection under this barrier.  I then proceeded with sequential excisions of additional necrotic soft tissue. I then utilized pulse irrigation with 3 L of normal saline solution to cleanse the wound. The wound measured from anterior to posterior 8 cm.  Its depth was 2.8 cm.  The length longitudinally was also 8 cm.  It was a depth of 2 cm.  Distal undermining along the Casillas fascia was approximately 5 cm.  And proximal undermining along the adductors was approximately 2 cm.  I then locally infiltrated the surrounding perimeter of the wound with a mixture of Exparel  with quarter percent Marcaine  with epinephrine .  A total of 50 mL was utilized.  The wound was then cleansed with just under a liter of Vashe.  I then moistened a 4 inch wide Kling with Vashe and packed the wound.  2 rolls were utilized.  This was then covered with 4 x 4 gauze and an ABD and secured with Medipore tape. This is the resulting wound prior utilization of the Pulsavac.    She tolerated the procedure well and I have seen her in her room postoperatively and she appears to be having much better pain control now.  Will initiate dressing changes with normal saline wet to moist.  I will continue to follow with you.  [name]  M.D., [facility] 12/01/2023 2:14 PM

## 2023-12-01 NOTE — Anesthesia Preprocedure Evaluation (Signed)
 Anesthesia Evaluation  Patient identified by MRN, date of birth, ID band Patient awake    Reviewed: Allergy & Precautions, NPO status , Patient's Chart, lab work & pertinent test results  History of Anesthesia Complications Negative for: history of anesthetic complications  Airway Mallampati: II  TM Distance: >3 FB Neck ROM: full    Dental  (+) Edentulous Upper, Edentulous Lower   Pulmonary neg sleep apnea, COPD,  COPD inhaler, Patient abstained from smoking.Not current smoker, former smoker Rare inhaler use; has oxygen at home but rarely uses it.   Pulmonary exam normal breath sounds clear to auscultation       Cardiovascular Exercise Tolerance: Good METShypertension, On Medications and On Home Beta Blockers +CHF  (-) CAD and (-) Past MI Normal cardiovascular exam(-) dysrhythmias + Valvular Problems/Murmurs  Rhythm:Regular Rate:Normal  TTE 2025:  1. Left ventricular ejection fraction, by estimation, is 50 to 55%. The  left ventricle has low normal function. The left ventricle has no regional  wall motion abnormalities. Left ventricular diastolic parameters were  normal. The average left ventricular   global longitudinal strain is -16.3 %. The global longitudinal strain is  normal.   2. Right ventricular systolic function is normal. The right ventricular  size is normal. There is normal pulmonary artery systolic pressure.   3. The mitral valve is normal in structure. Mild mitral valve  regurgitation. No evidence of mitral stenosis.   4. Tricuspid valve regurgitation is mild to moderate.   5. The aortic valve is normal in structure. Aortic valve regurgitation is  not visualized. Aortic valve sclerosis is present, with no evidence of  aortic valve stenosis.   6. The inferior vena cava is normal in size with greater than 50%  respiratory variability, suggesting right atrial pressure of 3 mmHg.      Neuro/Psych  PSYCHIATRIC  DISORDERS Anxiety     negative neurological ROS     GI/Hepatic Neg liver ROS,GERD  Medicated,,  Endo/Other  neg diabetesHypothyroidism    Renal/GU negative Renal ROS     Musculoskeletal   Abdominal   Peds  Hematology negative hematology ROS (+)   Anesthesia Other Findings Past Medical History: No date: (HFpEF) heart failure with preserved ejection fraction (HCC)     Comment:  a. 06/2020 Echo: EF 60-65%, no rwma, nl RV size/fxn.               Triv MR. Triv TR/AI. Mod elev PASP. No date: Anxiety No date: COPD (chronic obstructive pulmonary disease) (HCC) No date: GERD (gastroesophageal reflux disease) No date: Hypertension No date: Hyperthyroidism No date: Hypokalemia No date: IgA myeloma (HCC) No date: Multiple myeloma (HCC) No date: Pneumonia No date: Red blood cell antibody positive, compatible PRBC difficult  to obtain No date: S/P autologous bone marrow transplantation [facility])  Past Surgical History: 10/07/2021: ANTERIOR VITRECTOMY; Left     Comment:  Procedure: ANTERIOR VITRECTOMY;  Surgeon: Annell Kidney, MD;  Location: [facility];  Service:               Ophthalmology;  Laterality: Left; 10/07/2021: CATARACT EXTRACTION W/PHACO; Left     Comment:  Procedure: CATARACT EXTRACTION PHACO AND INTRAOCULAR               LENS PLACEMENT (IOC) LEFT VISION BLUE;  Surgeon:               Annell Kidney, MD;  Location: [facility];              Service: Ophthalmology;  Laterality: Left;  kit for               manual incision 14.56 01:21.4 10/21/2021: CATARACT EXTRACTION W/PHACO; Right     Comment:  Procedure: CATARACT EXTRACTION PHACO AND INTRAOCULAR               LENS PLACEMENT (IOC) RIGHT 11.12 01:48.1;  Surgeon:               Annell Kidney, MD;  Location: [facility];              Service: Ophthalmology;  Laterality: Right; 03/30/2019: ESOPHAGOGASTRODUODENOSCOPY (EGD) WITH PROPOFOL ; N/A     Comment:  Procedure:  ESOPHAGOGASTRODUODENOSCOPY (EGD) WITH               PROPOFOL ;  Surgeon: Luke Salaam, MD;  Location: [facility]               ENDOSCOPY;  Service: Gastroenterology;  Laterality: N/A; 12/16/2017: IR FLUORO GUIDE PORT INSERTION RIGHT 06/15/2021: IR IMAGING GUIDED PORT INSERTION No date: THYROIDECTOMY; N/A  BMI    Body Mass Index: 31.28 kg/m      Reproductive/Obstetrics negative OB ROS                             Anesthesia Physical Anesthesia Plan  ASA: 3  Anesthesia Plan: General   Post-op Pain Management: Minimal or no pain anticipated   Induction: Intravenous  PONV Risk Score and Plan: 2 and Propofol  infusion, TIVA, Ondansetron , Midazolam  and Treatment may vary due to age or medical condition  Airway Management Planned: Nasal Cannula  Additional Equipment: None  Intra-op Plan:   Post-operative Plan:   Informed Consent: I have reviewed the patients History and Physical, chart, labs and discussed the procedure including the risks, benefits and alternatives for the proposed anesthesia with the patient or authorized representative who has indicated his/her understanding and acceptance.     Dental advisory given  Plan Discussed with: CRNA and Surgeon  Anesthesia Plan Comments: (Discussed risks of anesthesia with patient, including possibility of difficulty with spontaneous ventilation under anesthesia necessitating airway intervention, PONV, and rare risks such as cardiac or respiratory or neurological events, and allergic reactions. Discussed the role of CRNA in patient's perioperative care. Patient understands.)        Anesthesia Quick Evaluation

## 2023-12-01 NOTE — Progress Notes (Signed)
 Date of Admission:  11/25/2023     ID: Brandi [name] is a 73 y.o. female Principal Problem:   Sepsis (HCC) Active Problems:   Essential hypertension   Chronic heart failure with preserved ejection fraction (HFpEF) (HCC)   Cellulitis of left thigh   COPD (chronic obstructive pulmonary disease) (HCC)   Necrotizing cellulitis   Abscess   Multiple myeloma in relapse (HCC)    Subjective: Doing well Underwent debridement of thigh infection  Medications:   aspirin  EC  81 mg Oral Daily   Chlorhexidine  Gluconate Cloth  6 each Topical Daily   enoxaparin  (LOVENOX ) injection  40 mg Subcutaneous Q24H   linezolid  600 mg Oral Q12H   montelukast   10 mg Oral QHS   pantoprazole   40 mg Oral Daily    Objective: Vital signs in last 24 hours: Patient Vitals for the past 24 hrs:  BP Temp Temp src Pulse Resp SpO2 Height Weight  12/01/23 0951 139/80 97.7 F (36.5 C) Oral 66 18 97 % -- --  12/01/23 0930 (!) 151/82 -- -- 61 14 97 % -- --  12/01/23 0924 -- -- -- 64 11 97 % -- --  12/01/23 0916 129/81 98.2 F (36.8 C) -- 60 16 97 % -- --  12/01/23 0915 129/81 -- -- 68 18 96 % -- --  12/01/23 0900 120/74 99.5 F (37.5 C) -- 87 (!) 22 100 % -- --  12/01/23 0853 118/72 99.5 F (37.5 C) -- 70 16 98 % -- --  12/01/23 0702 (!) 146/79 (!) 97.2 F (36.2 C) Temporal 65 16 99 % 5\' 2"  (1.575 m) 71.2 kg  12/01/23 0335 (!) 150/82 98.7 F (37.1 C) Oral 67 18 100 % -- --  12/01/23 0026 (!) 159/87 98.5 F (36.9 C) Oral 75 18 96 % -- --  11/30/23 2019 (!) 143/80 98.4 F (36.9 C) Oral 79 18 98 % -- --      PHYSICAL EXAM:  General: Alert, cooperative, no distress, appears stated age.  tion bilaterally. No Wheezing or Rhonchi. No rales. Heart: Regular rate and rhythm, no murmur, rub or gallop. Abdomen: Soft, non-tender,not distended. Bowel sounds normal. No masses Extremities: left thigh surgical dresisng Picture reviewed     Skin: No rashes or lesions. Or bruising Lymph: Cervical,  supraclavicular normal. Neurologic: Grossly non-focal  Lab Results    Latest Ref Rng & Units 11/30/2023    6:35 AM 11/29/2023    4:58 AM 11/28/2023    5:45 AM  CBC  WBC 4.0 - 10.5 K/uL 4.2  3.3  6.5   Hemoglobin 12.0 - 15.0 g/dL 16.1  8.8  8.4   Hematocrit 36.0 - 46.0 % 32.1  27.2  26.1   Platelets 150 - 400 K/uL 234  136  78        Latest Ref Rng & Units 11/30/2023    6:35 AM 11/29/2023    4:58 AM 11/28/2023    5:45 AM  CMP  Glucose 70 - 99 mg/dL 83  75  66   BUN 8 - 23 mg/dL 5  8  16    Creatinine 0.44 - 1.00 mg/dL 0.96  0.45  4.09   Sodium 135 - 145 mmol/L 138  139  138   Potassium 3.5 - 5.1 mmol/L 3.5  3.4  3.6   Chloride 98 - 111 mmol/L 105  107  105   CO2 22 - 32 mmol/L 26  23  20    Calcium  8.9 - 10.3 mg/dL  7.8  7.3  7.3       Microbiology: Wound culture staph aureus    Assessment/Plan: Purulent  cellulitis with abscess of the left upper thigh. Likely organism is MRSA S/P debridement On  linezolid and  Zosyn .   Multiple myeloma status post stem cell transplantation in 2019 and recurrence now on  chemotherapy History of recurrent strep pneumoniae bacteremia and history of left knee septic arthritis   Anemia   Acquired hypothyroidism following thyroidectomy for Graves' On Synthroid    Discussed the management with the patient and care team

## 2023-12-02 ENCOUNTER — Encounter: Payer: Self-pay | Admitting: Surgery

## 2023-12-02 DIAGNOSIS — L03116 Cellulitis of left lower limb: Secondary | ICD-10-CM | POA: Diagnosis not present

## 2023-12-02 DIAGNOSIS — I5032 Chronic diastolic (congestive) heart failure: Secondary | ICD-10-CM | POA: Diagnosis not present

## 2023-12-02 DIAGNOSIS — I10 Essential (primary) hypertension: Secondary | ICD-10-CM | POA: Diagnosis not present

## 2023-12-02 DIAGNOSIS — B9689 Other specified bacterial agents as the cause of diseases classified elsewhere: Secondary | ICD-10-CM

## 2023-12-02 DIAGNOSIS — B9620 Unspecified Escherichia coli [E. coli] as the cause of diseases classified elsewhere: Secondary | ICD-10-CM

## 2023-12-02 DIAGNOSIS — L02416 Cutaneous abscess of left lower limb: Secondary | ICD-10-CM | POA: Diagnosis not present

## 2023-12-02 DIAGNOSIS — Z1612 Extended spectrum beta lactamase (ESBL) resistance: Secondary | ICD-10-CM

## 2023-12-02 DIAGNOSIS — J449 Chronic obstructive pulmonary disease, unspecified: Secondary | ICD-10-CM | POA: Diagnosis not present

## 2023-12-02 DIAGNOSIS — B9562 Methicillin resistant Staphylococcus aureus infection as the cause of diseases classified elsewhere: Secondary | ICD-10-CM | POA: Diagnosis not present

## 2023-12-02 DIAGNOSIS — C9002 Multiple myeloma in relapse: Secondary | ICD-10-CM | POA: Diagnosis not present

## 2023-12-02 LAB — CBC WITH DIFFERENTIAL/PLATELET
Abs Immature Granulocytes: 0.39 10*3/uL — ABNORMAL HIGH (ref 0.00–0.07)
Basophils Absolute: 0 10*3/uL (ref 0.0–0.1)
Basophils Relative: 0 %
Eosinophils Absolute: 0.1 10*3/uL (ref 0.0–0.5)
Eosinophils Relative: 1 %
HCT: 30.3 % — ABNORMAL LOW (ref 36.0–46.0)
Hemoglobin: 9.2 g/dL — ABNORMAL LOW (ref 12.0–15.0)
Immature Granulocytes: 7 %
Lymphocytes Relative: 16 %
Lymphs Abs: 0.9 10*3/uL (ref 0.7–4.0)
MCH: 26.7 pg (ref 26.0–34.0)
MCHC: 30.4 g/dL (ref 30.0–36.0)
MCV: 88.1 fL (ref 80.0–100.0)
Monocytes Absolute: 0.3 10*3/uL (ref 0.1–1.0)
Monocytes Relative: 6 %
Neutro Abs: 3.8 10*3/uL (ref 1.7–7.7)
Neutrophils Relative %: 70 %
Platelets: 330 10*3/uL (ref 150–400)
RBC: 3.44 MIL/uL — ABNORMAL LOW (ref 3.87–5.11)
RDW: 18.2 % — ABNORMAL HIGH (ref 11.5–15.5)
Smear Review: NORMAL
WBC: 5.5 10*3/uL (ref 4.0–10.5)
nRBC: 0 % (ref 0.0–0.2)

## 2023-12-02 LAB — BASIC METABOLIC PANEL WITH GFR
Anion gap: 9 (ref 5–15)
BUN: 7 mg/dL — ABNORMAL LOW (ref 8–23)
CO2: 23 mmol/L (ref 22–32)
Calcium: 7.7 mg/dL — ABNORMAL LOW (ref 8.9–10.3)
Chloride: 106 mmol/L (ref 98–111)
Creatinine, Ser: 0.87 mg/dL (ref 0.44–1.00)
GFR, Estimated: >60 mL/min (ref >60)
Glucose, Bld: 93 mg/dL (ref 70–99)
Potassium: 3.7 mmol/L (ref 3.5–5.1)
Sodium: 138 mmol/L (ref 135–145)

## 2023-12-02 LAB — GLUCOSE, CAPILLARY
Glucose-Capillary: 105 mg/dL — ABNORMAL HIGH (ref 70–99)
Glucose-Capillary: 81 mg/dL (ref 70–99)
Glucose-Capillary: 86 mg/dL (ref 70–99)
Glucose-Capillary: 88 mg/dL (ref 70–99)
Glucose-Capillary: 92 mg/dL (ref 70–99)
Glucose-Capillary: 95 mg/dL (ref 70–99)
Glucose-Capillary: 99 mg/dL (ref 70–99)

## 2023-12-02 MED ORDER — CHLORHEXIDINE GLUCONATE CLOTH 2 % EX PADS
6.0000 | MEDICATED_PAD | Freq: Every day | CUTANEOUS | Status: DC
  Administered 2023-12-02 – 2023-12-06 (×5): 6 via TOPICAL

## 2023-12-02 NOTE — Progress Notes (Signed)
 Physical Therapy Treatment Patient Details Name: Brandi [name] MRN: [id number] DOB: [birth year] Today's Date: 12/02/2023   History of Present Illness Pt is a 73 y.o. female admitted with severe sepsis in the setting of L upper thigh cellulitis, GI symptoms including nausea, vomiting and abdominal pain, possible pneumonia, hypokalemia, thrombocytopenia.  S/p I&D/excision debridement of skin subcutaneous tissues and fascia L proximal/medial thigh d/t necrotizing cellulitis L proximal medial thigh 12/01/23.  PMH significant for MM on chemotherapy, chronic HFpEF, HTN, & hypothyroidism.    PT Comments  OT present with pt upon PT arrival; partial PT/OT co-treatment performed.  During session pt was SBA semi-supine to sitting EOB; CGA to stand from bed up to RW (bed linens changed); CGA to ambulate 40 feet with RW use; and min assist to lay back down in bed.  No c/o pain throughout session.  Will continue to focus on strengthening and progressive functional mobility during hospitalization.   If plan is discharge home, recommend the following: A little help with walking and/or transfers;A little help with bathing/dressing/bathroom;Assistance with cooking/housework;Assist for transportation;Help with stairs or ramp for entrance   Can travel by private vehicle        Equipment Recommendations  Other (comment) (Pt has RW, BSC, and manual w/c at home already)    Recommendations for Other Services       Precautions / Restrictions Precautions Precautions: Fall Recall of Precautions/Restrictions: Intact Precaution/Restrictions Comments: pt states she gets "hot flashes" and will "pass out" at home Restrictions Weight Bearing Restrictions Per Provider Order: No     Mobility  Bed Mobility Overal bed mobility: Needs Assistance Bed Mobility: Supine to Sit, Sit to Supine     Supine to sit: Supervision, HOB elevated Sit to supine: Min assist, HOB elevated (assist for L LE)   General bed mobility  comments: pt able to scoot up in bed on own    Transfers Overall transfer level: Needs assistance Equipment used: Rolling walker (2 wheels) Transfers: Sit to/from Stand Sit to Stand: Contact guard assist           General transfer comment: mild increased effort to stand from bed; vc's for UE placement    Ambulation/Gait Ambulation/Gait assistance: Contact guard assist Gait Distance (Feet): 40 Feet Assistive device: Rolling walker (2 wheels) Gait Pattern/deviations: Step-through pattern, Decreased step length - right, Decreased step length - left Gait velocity: decreased     General Gait Details: antalgic; decreased stance time L LE; steady with RW use   Stairs             Wheelchair Mobility     Tilt Bed    Modified Rankin (Stroke Patients Only)       Balance Overall balance assessment: Needs assistance Sitting-balance support: No upper extremity supported, Feet supported Sitting balance-Leahy Scale: Good Sitting balance - Comments: steady reaching within BOS   Standing balance support: Bilateral upper extremity supported, Reliant on assistive device for balance Standing balance-Leahy Scale: Fair Standing balance comment: steady static standing with B UE support on RW                            Communication Communication Communication: No apparent difficulties  Cognition Arousal: Alert Behavior During Therapy: WFL for tasks assessed/performed   PT - Cognitive impairments: No apparent impairments                         Following commands: Intact  Cueing Cueing Techniques: Verbal cues, Tactile cues  Exercises      General Comments General comments (skin integrity, edema, etc.): VSS (HR and SpO2 on room air) during session.  Nursing cleared pt for participation in physical therapy.  Pt agreeable to therapy session.      Pertinent Vitals/Pain Pain Assessment Pain Assessment: No/denies pain Pain Location: pt does  report L thigh pain today (but not during session however) Pain Intervention(s): Limited activity within patient's tolerance, Monitored during session, Repositioned    Home Living                          Prior Function            PT Goals (current goals can now be found in the care plan section) Acute Rehab PT Goals Patient Stated Goal: to improve strength and functional mobility PT Goal Formulation: With patient Time For Goal Achievement: 12/12/23 Potential to Achieve Goals: Good Progress towards PT goals: Progressing toward goals    Frequency    Min 2X/week      PT Plan      Co-evaluation PT/OT/SLP Co-Evaluation/Treatment: Yes Reason for Co-Treatment: For patient/therapist safety;To address functional/ADL transfers (d/t pt reported tolerance) PT goals addressed during session: Mobility/safety with mobility;Balance OT goals addressed during session: ADL's and self-care      AM-PAC PT "6 Clicks" Mobility   Outcome Measure  Help needed turning from your back to your side while in a flat bed without using bedrails?: None Help needed moving from lying on your back to sitting on the side of a flat bed without using bedrails?: A Little Help needed moving to and from a bed to a chair (including a wheelchair)?: A Little Help needed standing up from a chair using your arms (e.g., wheelchair or bedside chair)?: A Little Help needed to walk in hospital room?: A Little Help needed climbing 3-5 steps with a railing? : A Lot 6 Click Score: 18    End of Session Equipment Utilized During Treatment: Gait belt Activity Tolerance: Patient tolerated treatment well Patient left: in bed;with call bell/phone within reach;with bed alarm set Nurse Communication: Mobility status;Precautions PT Visit Diagnosis: Other abnormalities of gait and mobility (R26.89);Muscle weakness (generalized) (M62.81);Pain Pain - Right/Left: Left Pain - part of body: Hip     Time:  2841-3244 PT Time Calculation (min) (ACUTE ONLY): 23 min  Charges:    $Therapeutic Activity: 8-22 mins PT General Charges $$ ACUTE PT VISIT: 1 Visit                     Amador Junes, PT 12/02/23, 6:09 PM

## 2023-12-02 NOTE — Progress Notes (Signed)
 Date of Admission:  11/25/2023     ID: Brandi [name] is a 73 y.o. female Principal Problem:   Sepsis (HCC) Active Problems:   Essential hypertension   Chronic heart failure with preserved ejection fraction (HFpEF) (HCC)   Cellulitis of left thigh   COPD (chronic obstructive pulmonary disease) (HCC)   Necrotizing cellulitis   Abscess   Multiple myeloma in relapse (HCC)    Subjective: Doing well Walked in the room, sat in the chair Pain left thigh almost resolved  Medications:   aspirin  EC  81 mg Oral Daily   Chlorhexidine  Gluconate Cloth  6 each Topical Q0600   enoxaparin  (LOVENOX ) injection  40 mg Subcutaneous Q24H   linezolid   600 mg Oral Q12H   montelukast   10 mg Oral QHS   pantoprazole   40 mg Oral Daily    Objective: Vital signs in last 24 hours: Patient Vitals for the past 24 hrs:  BP Temp Pulse Resp SpO2  12/02/23 1216 127/72 98 F (36.7 C) 67 18 100 %  12/02/23 0829 107/62 98 F (36.7 C) 75 18 99 %  12/02/23 0457 135/64 98.5 F (36.9 C) 65 18 97 %  12/02/23 0008 113/67 98.7 F (37.1 C) 81 18 98 %  12/01/23 2017 (!) 147/72 98.5 F (36.9 C) 66 20 98 %      PHYSICAL EXAM:  General: Alert, cooperative, no distress, appears stated age.  RS- b/l air entry Heart: Regular rate and rhythm, no murmur, rub or gallop. Abdomen: Soft, non-tender,not distended. Bowel sounds normal. No masses Extremities: left thigh surgical dresisng Picture reviewed     Skin: No rashes or lesions. Or bruising Lymph: Cervical, supraclavicular normal. Neurologic: Grossly non-focal  Lab Results    Latest Ref Rng & Units 12/02/2023    6:38 AM 11/30/2023    6:35 AM 11/29/2023    4:58 AM  CBC  WBC 4.0 - 10.5 K/uL 5.5  4.2  3.3   Hemoglobin 12.0 - 15.0 g/dL 9.2  16.1  8.8   Hematocrit 36.0 - 46.0 % 30.3  32.1  27.2   Platelets 150 - 400 K/uL 330  234  136        Latest Ref Rng & Units 12/02/2023    6:38 AM 11/30/2023    6:35 AM 11/29/2023    4:58 AM  CMP  Glucose 70 - 99  mg/dL 93  83  75   BUN 8 - 23 mg/dL 7  <5  8   Creatinine 0.96 - 1.00 mg/dL 0.45  4.09  8.11   Sodium 135 - 145 mmol/L 138  138  139   Potassium 3.5 - 5.1 mmol/L 3.7  3.5  3.4   Chloride 98 - 111 mmol/L 106  105  107   CO2 22 - 32 mmol/L 23  26  23    Calcium  8.9 - 10.3 mg/dL 7.7  7.8  7.3       Microbiology: Wound culture staph aureus Susceptibility pending   Assessment/Plan: Purulent  cellulitis with abscess of the left upper thigh. Likely organism is MRSA S/P debridement On  linezolid  and  Zosyn .will deescalate once culture is finalized   Multiple myeloma status post stem cell transplantation in 2019 and recurrence now on  chemotherapy History of recurrent strep pneumoniae bacteremia and history of left knee septic arthritis   Anemia   Acquired hypothyroidism following thyroidectomy for Graves' On Synthroid    Discussed the management with the patient and care team ID will not see  her this weekend- on call ID available by phone for urgent issues- call if needed

## 2023-12-02 NOTE — TOC Progression Note (Signed)
 Transition of Care [facility]) - Progression Note    Patient Details  Name: Brandi [name] MRN: [id number] Date of Birth: August 31, 1950  Transition [facility] CM/SW Contact  Baird Bombard, RN Phone Number: 12/02/2023, 12:56 PM  Clinical Narrative:    Referral sent and accepted by Verdis Glade from [facility].         Expected Discharge Plan and Services                                               Social Determinants of Health (SDOH) Interventions SDOH Screenings   Food Insecurity: No Food Insecurity (09/04/2022)  Housing: Low Risk  (09/04/2022)  Transportation Needs: No Transportation Needs (09/04/2022)  Utilities: Not At Risk (09/04/2022)  Depression (PHQ2-9): Low Risk  (10/14/2022)  Tobacco Use: Medium Risk (12/01/2023)    Readmission Risk Interventions    05/27/2021   10:42 AM  Readmission Risk Prevention Plan  Transportation Screening Complete  Medication Review (RN Care Manager) Complete  PCP or Specialist appointment within 3-5 days of discharge Complete  HRI or Home Care Consult Complete  SW Recovery Care/Counseling Consult Complete  Palliative Care Screening Complete  Skilled Nursing Facility Not Applicable

## 2023-12-02 NOTE — Plan of Care (Signed)
  Problem: Education: Goal: Knowledge of General Education information will improve Description: Including pain rating scale, medication(s)/side effects and non-pharmacologic comfort measures Outcome: Progressing   Problem: Clinical Measurements: Goal: Respiratory complications will improve Outcome: Progressing   Problem: Clinical Measurements: Goal: Cardiovascular complication will be avoided Outcome: Progressing   Problem: Nutrition: Goal: Adequate nutrition will be maintained Outcome: Progressing   Problem: Pain Managment: Goal: General experience of comfort will improve and/or be controlled Outcome: Progressing   Problem: Safety: Goal: Ability to remain free from injury will improve Outcome: Progressing

## 2023-12-02 NOTE — Progress Notes (Signed)
 PROGRESS NOTE    Brandi [name]  ZOX:[id number] DOB: 23-Feb-1951 DOA: 11/25/2023 PCP: [name], MD  Subjective: Patient seen and examined.  Left thigh still numb from Exparel /lidocaine  that was injected into wound edges. Afebrile.  Intra-op wound cx growing staph aureus and GNR.  General surgery approved starting chemical DVT prophylaxis today.   Hospital Course: HPI: Brandi [name] is a 73 y.o. female with medical history significant of MM on chemotherapy, chronic HFpEF, HTN, hypothyroidism, presented with GI symptoms including nauseous vomiting abdominal pain.  She also complains about worsening of left leg swelling pain and drainage.   Patient has history of multiple myeloma since 2018 and has been on chemotherapy, last chemo was this past Tuesday.  Started on Wednesday, patient started to feel nauseous and cramping-like lower abdominal pain, with multiple vomiting on Wednesday but no more vomiting since Thursday.  Location of abdominal pain is rather poorly defined.  No exacerbating factors.  2 days ago she also started to notice " a boil" in the left inner thigh, gradually getting bigger and ruptured yesterday with thick yellowish pus coming out.  She has had some chills but no fever.  No chest pain shortness of breath no diarrhea denies any urinary symptoms.  Denied any active bleeding.  Significant Events: Admitted 11/25/2023 cellulitis   Significant Labs: WBC 3.5, HgB 11.6, plt 63 Na 136, K 2.4, CO2 of 18, BUN 19, Scr 0.91. glu 90 Ca 7.7, TP 6.6, alb 3.8, AST 18, ALT 10, alk phos 64 Mg 1.5  Significant Imaging Studies: CT abd/pelvis, CTPA No CT evidence for acute pulmonary embolus. 2. No acute findings in the abdomen or pelvis. Specifically, no findings to explain the patient's history of abdominal pain. 3. Endometrial stripe appears thickened at 13 mm on sagittal imaging. Correlation for abnormal vaginal bleeding recommended. Follow-up pelvic ultrasound recommended to  further evaluate as endometrial neoplasm could have this appearance. 4. Calcified uterine fibroids. 5. Two 2 mm left lung nodules.  CT left femur Moderate medial and posteromedial and mild anteromedial proximal to mid left thigh subcutaneous fat edema and swelling. Moderate thickening of the regional left thigh skin. No walled-off fluid collection is seen to indicate an abscess. 2. Mild focal thickening/irregularity of the skin at the posterior aspect of the proximal to mid left thigh. This may represent the region of the reported posterior thigh weeping wound. 3. No high-grade fascial thickening, subcutaneous air, or perifascial air is seen to suggest CT evidence of necrotizing fasciitis. 4. No definite destructive lytic lesion is seen within the left femur in this patient with history of multiple myeloma. 5. 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 CT head Left middle ear and mastoid opacification is new since 2022. Consider with Acute Otitis Media with mastoid effusion. 2. 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 CXR Upper zone pulmonary vascular prominence raises the possibility of pulmonary venous hypertension. 2. Suspected linear/bandlike subsegmental atelectasis or scarring at the left lung base. 3. Aortic Atherosclerosis 11-29-2023 CT left femur Persistent subcutaneous edema and fat stranding extending along the left proximal to mid medial thigh with overlying skin thickening. Increased subcutaneous edema and fat stranding along the left hip and thigh with overlying skin thickening. These findings are compatible with cellulitis. No loculated fluid collection. 2. Increased intramuscular edema in the left adductor  compartment musculature with similar to slightly decreased stranding/edema extending along the left gracilis muscle,  concerning for myositis.  No discrete intramuscular collection identified  Antibiotic Therapy: Anti-infectives (From admission, onward)    Start     Dose/Rate Route Frequency Ordered Stop   11/27/23 0900  cefTRIAXone  (ROCEPHIN ) 2 g in sodium chloride  0.9 % 100 mL IVPB        2 g 200 mL/hr over 30 Minutes Intravenous Every 24 hours 11/27/23 0747 12/02/23 0859   11/26/23 1445  azithromycin  (ZITHROMAX ) tablet 500 mg        500 mg Oral Daily 11/26/23 1444 12/01/23 0959   11/26/23 1000  vancomycin  (VANCOCIN ) IVPB 1000 mg/200 mL premix        1,000 mg 200 mL/hr over 60 Minutes Intravenous Every 24 hours 11/25/23 1303 11/29/23 1232   11/25/23 1700  ceFAZolin  (ANCEF ) IVPB 1 g/50 mL premix  Status:  Discontinued        1 g 100 mL/hr over 30 Minutes Intravenous Every 8 hours 11/25/23 1307 11/27/23 0747   11/25/23 1330  vancomycin  (VANCOREADY) IVPB 500 mg/100 mL        500 mg 100 mL/hr over 60 Minutes Intravenous  Once 11/25/23 1247 11/25/23 1523   11/25/23 0845  piperacillin -tazobactam (ZOSYN ) IVPB 3.375 g        3.375 g 100 mL/hr over 30 Minutes Intravenous  Once 11/25/23 0830 11/25/23 0934   11/25/23 0830  cefTRIAXone  (ROCEPHIN ) 2 g in sodium chloride  0.9 % 100 mL IVPB  Status:  Discontinued        2 g 200 mL/hr over 30 Minutes Intravenous Once 11/25/23 0817 11/25/23 0830   11/25/23 0830  vancomycin  (VANCOCIN ) IVPB 1000 mg/200 mL premix        1,000 mg 200 mL/hr over 60 Minutes Intravenous  Once 11/25/23 0817 11/25/23 1059       Procedures: 12-01-2023 I&D left medial thigh abscess  Consultants: ID General Surgery    Assessment and Plan: * Sepsis (HCC) On admission through 11-29-2023 Hypotension, tachycardia, elevated lactate 2.2 on presentation with known abscess given concern for severe sepsis.  Requiring multiple IV fluid boluses to maintain MAP greater than 65.  No vasopressor medications required at this time.  Blood pressure appears to be improving.  Continue to monitor closely.  Blood cultures drawn.   Broad-spectrum antibiotic coverage on board.  Appears to be resolving.  No longer requiring midodrine .   11-30-2023 resolved.   Cellulitis and abscess of left lower extremity On admission through 11-29-2023 CT scan on presentation noting mostly medial left thigh subcutaneous edema, but no walled off fluid collection to indicate abscess.  Likely etiology of patient's sepsis.  Broad-spectrum antibiotic coverage on board initially cefazolin  and vancomycin .  Transitioning to ceftriaxone  2 g every 24 hours secondary to pneumonia coverage on 5/4.  Small weeping area with induration, daily dressing change.  Will order wound care consult.  Given continued induration induration and tenderness, will repeat CT of the area to assess for developed underlying abscess.  If abscess is present, will likely need to get general surgery involved for I&D.    11-30-2023 repeat CT femur yesterday still shows no abscess. Pt has been on IV rocephin /vanco for 5 days now. While her labs are improved, pt still with clinically significant cellulitis. Will ask ID consult to see patient to see if there are any abx changes that need to be made.  I don't see good anaerobic coverage right now but for a medial thigh cellulitis, I'm not sure what organisms to target.  12-01-2023 s/p I&D in OR today.  8 x 8 x 2 cm abscess/necrotic tissue was drained/debrided in OR. Cultures taken. IV dilaudid  prn ordered. Remains on IV zosyn  and po zyvox .   12-02-2023 continue with zyvox  and IV zosyn .  Left thigh still numb from Exparel /lidocaine  that was injected into wound edges. Afebrile. Intra-op wound cx growing staph aureus and GNR.  General surgery approved starting chemical DVT prophylaxis today.  Awaiting ID recs. Continue prn IV dilaudid .  COPD (chronic obstructive pulmonary disease) (HCC) 11-30-2023 not convinced she has pneumonia. Pt does have hx of COPD. On IV rocephin . Not wheezing or hypoxic.  12-01-2023 stable   12-02-2023  stable  Chronic heart failure with preserved ejection fraction (HFpEF) (HCC) 11-30-2023 chronic. Not hypoxic. Lying flat in bed.  12-01-2023 stable  12-02-2023 stable  Essential hypertension 11-30-2023 stable. Pt with some swelling in left leg. Still restart low dose lasix  along with kcl supplementation.  12-01-2023 hold lasix  as her left leg swelling likely due to abscess/cellulitis.  12-02-2023 stable  Hypokalemia-resolved as of 11/30/2023 Treated and resolved.  Multiple myeloma in relapse (HCC) 12-01-2023 immunosuppressed due to MM.  12-02-2023 stable   DVT prophylaxis: enoxaparin  (LOVENOX ) injection 40 mg Start: 12/02/23 1800    Code Status: Full Code Family Communication: no family at bedside. Pt is decisional. Disposition Plan: return home Reason for continuing need for hospitalization: remains on IV ABX. Needs to mobilize. PT consult pending.  Objective: Vitals:   12/02/23 0008 12/02/23 0457 12/02/23 0829 12/02/23 1216  BP: 113/67 135/64 107/62 127/72  Pulse: 81 65 75 67  Resp: 18 18 18 18   Temp: 98.7 F (37.1 C) 98.5 F (36.9 C) 98 F (36.7 C) 98 F (36.7 C)  TempSrc:      SpO2: 98% 97% 99% 100%  Weight:      Height:        Intake/Output Summary (Last 24 hours) at 12/02/2023 1326 Last data filed at 12/02/2023 1100 Gross per 24 hour  Intake 474 ml  Output 500 ml  Net -26 ml   Filed Weights   11/25/23 0649 12/01/23 0702  Weight: 71.2 kg 71.2 kg    Examination:  Physical Exam Vitals and nursing note reviewed.  Constitutional:      General: She is not in acute distress.    Appearance: Normal appearance. She is not toxic-appearing or diaphoretic.  HENT:     Head: Normocephalic and atraumatic.  Eyes:     General: No scleral icterus. Cardiovascular:     Rate and Rhythm: Normal rate and regular rhythm.  Pulmonary:     Effort: Pulmonary effort is normal. No respiratory distress.     Breath sounds: Normal breath sounds.  Abdominal:     General:  Abdomen is flat. Bowel sounds are normal.     Palpations: Abdomen is soft.  Musculoskeletal:     Comments: Bulky dressing on left medial thigh  Skin:    General: Skin is warm and dry.     Capillary Refill: Capillary refill takes less than 2 seconds.  Neurological:     General: No focal deficit present.     Mental Status: She is alert and oriented to person, place, and time.     Data Reviewed: I have personally reviewed following labs and imaging studies  CBC: Recent Labs  Lab 11/26/23 0136 11/26/23 0520 11/27/23 0525 11/28/23 0545 11/29/23 0458 11/30/23 0635 12/02/23 0638  WBC 10.7*   < > 10.7* 6.5 3.3* 4.2 5.5  NEUTROABS 9.4*  --   --   --   --   --  3.8  HGB 9.5*   < > 8.0* 8.4* 8.8* 10.2* 9.2*  HCT 30.4*   < > 24.3* 26.1* 27.2* 32.1* 30.3*  MCV 87.6   < > 85.3 86.1 86.3 85.8 88.1  PLT 27*   < > 32* 78* 136* 234 330   < > = values in this interval not displayed.   Basic Metabolic Panel: Recent Labs  Lab 11/26/23 0520 11/26/23 1412 11/27/23 0525 11/28/23 0545 11/29/23 0458 11/30/23 0635 12/02/23 0638  NA  --    < > 140 138 139 138 138  K  --    < > 3.7 3.6 3.4* 3.5 3.7  CL  --    < > 109 105 107 105 106  CO2  --    < > 22 20* 23 26 23   GLUCOSE  --    < > 70 66* 75 83 93  BUN  --    < > 23 16 8  <5* 7*  CREATININE  --    < > 0.82 0.70 0.55 0.71 0.87  CALCIUM   --    < > 7.2* 7.3* 7.3* 7.8* 7.7*  MG 2.1  --  2.2 2.2 2.3 2.0  --   PHOS 2.9  --   --  2.9  --   --   --    < > = values in this interval not displayed.   GFR: Estimated Creatinine Clearance: 54.8 mL/min (by C-G formula based on SCr of 0.87 mg/dL). Liver Function Tests: Recent Labs  Lab 11/26/23 0018 11/28/23 0545  AST 12*  --   ALT 9  --   ALKPHOS 44  --   BILITOT 0.9  --   PROT 4.8*  --   ALBUMIN 2.7* 2.8*   BNP (last 3 results) Recent Labs    03/30/23 0918  BNP 17.5   CBG: Recent Labs  Lab 12/01/23 2027 12/02/23 0009 12/02/23 0456 12/02/23 0826 12/02/23 1212  GLUCAP 85 105*  99 81 92   Sepsis Labs: Recent Labs  Lab 11/26/23 0520  LATICACIDVEN 2.2*    Recent Results (from the past 240 hours)  Blood culture (routine x 2)     Status: None   Collection Time: 11/25/23  7:03 AM   Specimen: BLOOD  Result Value Ref Range Status   Specimen Description BLOOD RIGHT ANTECUBITAL  Final   Special Requests   Final    BOTTLES DRAWN AEROBIC AND ANAEROBIC Blood Culture adequate volume   Culture   Final    NO GROWTH 5 DAYS Performed at [facility], [address] [place], [place] [address]    Report Status 11/30/2023 FINAL  Final  Blood culture (routine x 2)     Status: None   Collection Time: 11/25/23  9:29 AM   Specimen: BLOOD  Result Value Ref Range Status   Specimen Description BLOOD BLOOD RIGHT HAND  Final   Special Requests   Final    BOTTLES DRAWN AEROBIC AND ANAEROBIC Blood Culture adequate volume   Culture   Final    NO GROWTH 5 DAYS Performed at [facility], [address], [place], [place] [address]    Report Status 11/30/2023 FINAL  Final  MRSA Next Gen by PCR, Nasal     Status: None   Collection Time: 11/25/23 12:59 PM   Specimen: Nasal Mucosa; Nasal Swab  Result Value Ref Range Status   MRSA by PCR Next Gen NOT DETECTED NOT DETECTED Final    Comment: (NOTE) The  GeneXpert MRSA Assay (FDA approved for NASAL specimens only), is one component of a comprehensive MRSA colonization surveillance program. It is not intended to diagnose MRSA infection nor to guide or monitor treatment for MRSA infections. Test performance is not FDA approved in patients less than 38 years old. Performed at [facility], [address] [place], [place] [address]   Group A Strep by PCR     Status: None   Collection Time: 11/25/23 12:59 PM   Specimen: Nasal Mucosa; Sterile Swab  Result Value Ref Range Status   Group A Strep by PCR NOT DETECTED NOT DETECTED Final    Comment: Performed at [facility], [address]  [place], [place] [address]  Expectorated Sputum Assessment w Gram Stain, Rflx to Resp Cult     Status: None   Collection Time: 11/27/23 12:15 PM   Specimen: Sputum  Result Value Ref Range Status   Specimen Description SPUTUM  Final   Special Requests NONE  Final   Sputum evaluation   Final    THIS SPECIMEN IS ACCEPTABLE FOR SPUTUM CULTURE Performed at [facility], [address] [place], [place] [address]    Report Status 11/27/2023 FINAL  Final  Culture, Respiratory w Gram Stain     Status: None   Collection Time: 11/27/23 12:15 PM   Specimen: SPU  Result Value Ref Range Status   Specimen Description   Final    SPUTUM Performed at [facility], [address] [place], [place] [address]    Special Requests   Final    NONE Reflexed from [id number] Performed at [facility], [address] [place], [place] [address]    Gram Stain   Final    RARE WBC PRESENT, PREDOMINANTLY PMN FEW GRAM POSITIVE COCCI    Culture   Final    Normal respiratory flora-no Staph aureus or Pseudomonas seen Performed at [facility], [address], [place], [place] [address]    Report Status 11/29/2023 FINAL  Final  Aerobic Culture w Gram Stain (superficial specimen)     Status: None (Preliminary result)   Collection Time: 11/30/23  4:00 PM   Specimen: Wound  Result Value Ref Range Status   Specimen Description   Final    WOUND Performed at [facility], [address] [place], [place] [address]    Special Requests   Final     THIGH Performed at [facility], [address] [place], [place] [address]    Gram Stain NO WBC SEEN RARE GRAM NEGATIVE RODS   Final   Culture   Final    ABUNDANT STAPHYLOCOCCUS AUREUS ABUNDANT GRAM NEGATIVE RODS CULTURE REINCUBATED FOR BETTER GROWTH Performed at [facility], [address] [place], [place] [address]    Report Status PENDING  Incomplete  Aerobic/Anaerobic Culture w Gram Stain (surgical/deep  wound)     Status: None (Preliminary result)   Collection Time: 12/01/23  8:19 AM   Specimen: Wound; Abscess  Result Value Ref Range Status   Specimen Description   Final    WOUND Performed at [facility], [address] [place], [place] [address]    Special Requests   Final    NONE Performed at [facility], [address] [place], [place] [address]    Gram Stain NO WBC SEEN RARE GRAM POSITIVE COCCI IN PAIRS   Final   Culture   Final    CULTURE REINCUBATED FOR BETTER  GROWTH Performed at [facility], [address], [place], [place] [address]    Report Status PENDING  Incomplete    Scheduled Meds:  aspirin  EC  81 mg Oral Daily   Chlorhexidine  Gluconate Cloth  6 each Topical Q0600   enoxaparin  (LOVENOX ) injection  40 mg Subcutaneous Q24H   linezolid   600 mg Oral Q12H   montelukast   10 mg Oral QHS   pantoprazole   40 mg Oral Daily   Continuous Infusions:  piperacillin -tazobactam (ZOSYN )  IV 3.375 g (12/02/23 0512)     LOS: 7 days   Time spent: 50 minutes  Unk Garb, DO  [facility]  12/02/2023, 1:26 PM

## 2023-12-02 NOTE — Progress Notes (Signed)
 CC: Left Medial Thigh Necrotizing Cellulitis Subjective: Patient seen sitting up in bed, doing well. She reports her pain is minimal. She is tolerating diet. She reports nursing starff changed her bandages this morning.   Objective: Vital signs in last 24 hours: Temp:  [98 F (36.7 C)-98.7 F (37.1 C)] 98 F (36.7 C) (05/09 1216) Pulse Rate:  [65-81] 67 (05/09 1216) Resp:  [18-20] 18 (05/09 1216) BP: (107-147)/(62-72) 127/72 (05/09 1216) SpO2:  [97 %-100 %] 100 % (05/09 1216) Last BM Date : 11/29/23  Intake/Output from previous day: 05/08 0701 - 05/09 0700 In: 654 [P.O.:354; I.V.:300] Out: 500 [Urine:500] Intake/Output this shift: Total I/O In: 120 [P.O.:120] Out: -   Physical exam:  Wound with some induration around it but no surrounding erythema, dressing in place with some serous drainage on it, no concern for purulent discharge or necrotic tissue.   Lab Results: CBC  Recent Labs    11/30/23 0635 12/02/23 0638  WBC 4.2 5.5  HGB 10.2* 9.2*  HCT 32.1* 30.3*  PLT 234 330   BMET Recent Labs    11/30/23 0635 12/02/23 0638  NA 138 138  K 3.5 3.7  CL 105 106  CO2 26 23  GLUCOSE 83 93  BUN <5* 7*  CREATININE 0.71 0.87  CALCIUM  7.8* 7.7*   PT/INR No results for input(s): "LABPROT", "INR" in the last 72 hours. ABG No results for input(s): "PHART", "HCO3" in the last 72 hours.  Invalid input(s): "PCO2", "PO2"  Studies/Results: No results found.  Anti-infectives: Anti-infectives (From admission, onward)    Start     Dose/Rate Route Frequency Ordered Stop   11/30/23 2200  piperacillin -tazobactam (ZOSYN ) IVPB 3.375 g        3.375 g 12.5 mL/hr over 240 Minutes Intravenous Every 8 hours 11/30/23 1703     11/30/23 1800  linezolid  (ZYVOX ) tablet 600 mg        600 mg Oral Every 12 hours 11/30/23 1547     11/27/23 0900  cefTRIAXone  (ROCEPHIN ) 2 g in sodium chloride  0.9 % 100 mL IVPB  Status:  Discontinued        2 g 200 mL/hr over 30 Minutes Intravenous Every  24 hours 11/27/23 0747 11/30/23 1703   11/26/23 1445  azithromycin  (ZITHROMAX ) tablet 500 mg        500 mg Oral Daily 11/26/23 1444 11/30/23 0853   11/26/23 1000  vancomycin  (VANCOCIN ) IVPB 1000 mg/200 mL premix        1,000 mg 200 mL/hr over 60 Minutes Intravenous Every 24 hours 11/25/23 1303 11/29/23 1232   11/25/23 1700  ceFAZolin  (ANCEF ) IVPB 1 g/50 mL premix  Status:  Discontinued        1 g 100 mL/hr over 30 Minutes Intravenous Every 8 hours 11/25/23 1307 11/27/23 0747   11/25/23 1330  vancomycin  (VANCOREADY) IVPB 500 mg/100 mL        500 mg 100 mL/hr over 60 Minutes Intravenous  Once 11/25/23 1247 11/25/23 1523   11/25/23 0845  piperacillin -tazobactam (ZOSYN ) IVPB 3.375 g        3.375 g 100 mL/hr over 30 Minutes Intravenous  Once 11/25/23 0830 11/25/23 0934   11/25/23 0830  cefTRIAXone  (ROCEPHIN ) 2 g in sodium chloride  0.9 % 100 mL IVPB  Status:  Discontinued        2 g 200 mL/hr over 30 Minutes Intravenous Once 11/25/23 0817 11/25/23 0830   11/25/23 0830  vancomycin  (VANCOCIN ) IVPB 1000 mg/200 mL premix  1,000 mg 200 mL/hr over 60 Minutes Intravenous  Once 11/25/23 0817 11/25/23 1059       Assessment/Plan:  Patient s/p debridement of left medial thigh, no concern for ongoing necrosis, wound appears well. Continue local wound care with gauze and abd pads. Continue antibiotics per ID recommendations.   Severa Daniels, M.D. [facility]

## 2023-12-02 NOTE — Progress Notes (Signed)
 Occupational Therapy Treatment Patient Details Name: Brandi [name] MRN: [id number] DOB: 1951-05-20 Today's Date: 12/02/2023   History of present illness Pt is a 73 y.o. female admitted with severe sepsis in the setting of L upper thigh cellulitis, GI symptoms including nausea, vomiting and abdominal pain, possible pneumonia, hypokalemia, thrombocytopenia. PMH significant for MM on chemotherapy, chronic HFpEF, HTN, & hypothyroidism   OT comments  Chart reviewed to date, greeted pt in room, semi supine in bed, agreeable to participation in OT with encouragement. Tx session targeted improving functional activity tolerance in preparation for ADL tasks. Bed mobility completed with supervision, SET UP for grooming tasks, STS with CGA, amb in room with RW with CGA approx 20'. Linen change provided as bed appeared soiled. Pt is making progress towards goals, discharge recommendation remains appropriate.       If plan is discharge home, recommend the following:  A little help with walking and/or transfers;A little help with bathing/dressing/bathroom;Assistance with cooking/housework;Assist for transportation   Equipment Recommendations  None recommended by OT    Recommendations for Other Services      Precautions / Restrictions Precautions Precautions: Fall Precaution/Restrictions Comments: pt states she gets "hot flashes" and will "pass out" at home Restrictions Weight Bearing Restrictions Per Provider Order: No       Mobility Bed Mobility Overal bed mobility: Needs Assistance Bed Mobility: Supine to Sit     Supine to sit: Supervision, HOB elevated          Transfers Overall transfer level: Needs assistance Equipment used: Rolling walker (2 wheels) Transfers: Sit to/from Stand, Bed to chair/wheelchair/BSC Sit to Stand: Contact guard assist                 Balance Overall balance assessment: Needs assistance Sitting-balance support: Feet supported Sitting  balance-Leahy Scale: Good     Standing balance support: Bilateral upper extremity supported, Reliant on assistive device for balance Standing balance-Leahy Scale: Fair                             ADL either performed or assessed with clinical judgement   ADL Overall ADL's : Needs assistance/impaired     Grooming: Set up;Sitting                   Toilet Transfer: Contact guard assist;Ambulation;Rolling walker (2 wheels) Toilet Transfer Details (indicate cue type and reason): simulated with RW Toileting- Clothing Manipulation and Hygiene: +2 for safety/equipment;Sit to/from stand;Moderate assistance Toileting - Clothing Manipulation Details (indicate cue type and reason): anticipate     Functional mobility during ADLs: Contact guard assist;Rolling walker (2 wheels);Cueing for safety      Extremity/Trunk Assessment              Vision       Perception     Praxis     Communication Communication Communication: No apparent difficulties   Cognition Arousal: Alert Behavior During Therapy: WFL for tasks assessed/performed Cognition: No apparent impairments                               Following commands: Intact        Cueing   Cueing Techniques: Verbal cues, Tactile cues  Exercises Other Exercises Other Exercises: edu re: role of OT, importance of continued progression of mobility    Shoulder Instructions       General Comments vss throughout  Pertinent Vitals/ Pain       Pain Assessment Pain Assessment: No/denies pain Pain Location: pt does report L thigh pain today, not during session however  Home Living                                          Prior Functioning/Environment              Frequency  Min 2X/week        Progress Toward Goals  OT Goals(current goals can now be found in the care plan section)  Progress towards OT goals: Progressing toward goals  Acute Rehab OT Goals Time  For Goal Achievement: 12/12/23  Plan      Co-evaluation    PT/OT/SLP Co-Evaluation/Treatment: Yes Reason for Co-Treatment: For patient/therapist safety;To address functional/ADL transfers (due to pt reported tolerance)   OT goals addressed during session: ADL's and self-care      AM-PAC OT "6 Clicks" Daily Activity     Outcome Measure   Help from another person eating meals?: None Help from another person taking care of personal grooming?: None Help from another person toileting, which includes using toliet, bedpan, or urinal?: A Little Help from another person bathing (including washing, rinsing, drying)?: A Little Help from another person to put on and taking off regular upper body clothing?: A Little Help from another person to put on and taking off regular lower body clothing?: A Little 6 Click Score: 20    End of Session Equipment Utilized During Treatment: Gait belt;Rolling walker (2 wheels)  OT Visit Diagnosis: Muscle weakness (generalized) (M62.81);Other abnormalities of gait and mobility (R26.89);Unsteadiness on feet (R26.81)   Activity Tolerance Patient tolerated treatment well   Patient Left Other (comment) (seated on edge of bed in care of PT)   Nurse Communication Mobility status        Time: 1610-9604 OT Time Calculation (min): 29 min  Charges: OT General Charges $OT Visit: 1 Visit OT Treatments $Therapeutic Activity: 8-22 mins  Gerre Kraft, OTD OTR/L  12/02/23, 3:41 PM

## 2023-12-03 DIAGNOSIS — L03116 Cellulitis of left lower limb: Secondary | ICD-10-CM | POA: Diagnosis not present

## 2023-12-03 DIAGNOSIS — I5032 Chronic diastolic (congestive) heart failure: Secondary | ICD-10-CM | POA: Diagnosis not present

## 2023-12-03 DIAGNOSIS — C9002 Multiple myeloma in relapse: Secondary | ICD-10-CM | POA: Diagnosis not present

## 2023-12-03 DIAGNOSIS — I10 Essential (primary) hypertension: Secondary | ICD-10-CM | POA: Diagnosis not present

## 2023-12-03 LAB — AEROBIC CULTURE W GRAM STAIN (SUPERFICIAL SPECIMEN): Gram Stain: NONE SEEN

## 2023-12-03 LAB — GLUCOSE, CAPILLARY: Glucose-Capillary: 102 mg/dL — ABNORMAL HIGH (ref 70–99)

## 2023-12-03 NOTE — Progress Notes (Signed)
 PROGRESS NOTE    Brandi [name]  GNF:[id number] DOB: Mar 29, 1951 DOA: 11/25/2023 PCP: [name], MD  Subjective: Patient seen and examined.  Left thigh still numb from intra-operative Exparel  injection Stable on Zosyn /Zyvox .  Able to walk 40 feet with PT. Pt lives with dtr. She will need home health RN for wound dressing changes.   Hospital Course: HPI: Brandi [name] is a 73 y.o. female with medical history significant of MM on chemotherapy, chronic HFpEF, HTN, hypothyroidism, presented with GI symptoms including nauseous vomiting abdominal pain.  She also complains about worsening of left leg swelling pain and drainage.   Patient has history of multiple myeloma since 2018 and has been on chemotherapy, last chemo was this past Tuesday.  Started on Wednesday, patient started to feel nauseous and cramping-like lower abdominal pain, with multiple vomiting on Wednesday but no more vomiting since Thursday.  Location of abdominal pain is rather poorly defined.  No exacerbating factors.  2 days ago she also started to notice " a boil" in the left inner thigh, gradually getting bigger and ruptured yesterday with thick yellowish pus coming out.  She has had some chills but no fever.  No chest pain shortness of breath no diarrhea denies any urinary symptoms.  Denied any active bleeding.  Significant Events: Admitted 11/25/2023 cellulitis   Significant Labs: WBC 3.5, HgB 11.6, plt 63 Na 136, K 2.4, CO2 of 18, BUN 19, Scr 0.91. glu 90 Ca 7.7, TP 6.6, alb 3.8, AST 18, ALT 10, alk phos 64 Mg 1.5  Significant Imaging Studies: CT abd/pelvis, CTPA No CT evidence for acute pulmonary embolus. 2. No acute findings in the abdomen or pelvis. Specifically, no findings to explain the patient's history of abdominal pain. 3. Endometrial stripe appears thickened at 13 mm on sagittal imaging. Correlation for abnormal vaginal bleeding recommended. Follow-up pelvic ultrasound recommended to further evaluate  as endometrial neoplasm could have this appearance. 4. Calcified uterine fibroids. 5. Two 2 mm left lung nodules.  CT left femur Moderate medial and posteromedial and mild anteromedial proximal to mid left thigh subcutaneous fat edema and swelling. Moderate thickening of the regional left thigh skin. No walled-off fluid collection is seen to indicate an abscess. 2. Mild focal thickening/irregularity of the skin at the posterior aspect of the proximal to mid left thigh. This may represent the region of the reported posterior thigh weeping wound. 3. No high-grade fascial thickening, subcutaneous air, or perifascial air is seen to suggest CT evidence of necrotizing fasciitis. 4. No definite destructive lytic lesion is seen within the left femur in this patient with history of multiple myeloma. 5. 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 CT head Left middle ear and mastoid opacification is new since 2022. Consider with Acute Otitis Media with mastoid effusion. 2. 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 CXR Upper zone pulmonary vascular prominence raises the possibility of pulmonary venous hypertension. 2. Suspected linear/bandlike subsegmental atelectasis or scarring at the left lung base. 3. Aortic Atherosclerosis 11-29-2023 CT left femur Persistent subcutaneous edema and fat stranding extending along the left proximal to mid medial thigh with overlying skin thickening. Increased subcutaneous edema and fat stranding along the left hip and thigh with overlying skin thickening. These findings are compatible with cellulitis. No loculated fluid collection. 2. Increased intramuscular edema in the left adductor  compartment musculature with similar to slightly decreased stranding/edema extending along the left  gracilis muscle, concerning for myositis. No discrete  intramuscular collection identified  Antibiotic Therapy: Anti-infectives (From admission, onward)    Start     Dose/Rate Route Frequency Ordered Stop   11/27/23 0900  cefTRIAXone  (ROCEPHIN ) 2 g in sodium chloride  0.9 % 100 mL IVPB        2 g 200 mL/hr over 30 Minutes Intravenous Every 24 hours 11/27/23 0747 12/02/23 0859   11/26/23 1445  azithromycin  (ZITHROMAX ) tablet 500 mg        500 mg Oral Daily 11/26/23 1444 12/01/23 0959   11/26/23 1000  vancomycin  (VANCOCIN ) IVPB 1000 mg/200 mL premix        1,000 mg 200 mL/hr over 60 Minutes Intravenous Every 24 hours 11/25/23 1303 11/29/23 1232   11/25/23 1700  ceFAZolin  (ANCEF ) IVPB 1 g/50 mL premix  Status:  Discontinued        1 g 100 mL/hr over 30 Minutes Intravenous Every 8 hours 11/25/23 1307 11/27/23 0747   11/25/23 1330  vancomycin  (VANCOREADY) IVPB 500 mg/100 mL        500 mg 100 mL/hr over 60 Minutes Intravenous  Once 11/25/23 1247 11/25/23 1523   11/25/23 0845  piperacillin -tazobactam (ZOSYN ) IVPB 3.375 g        3.375 g 100 mL/hr over 30 Minutes Intravenous  Once 11/25/23 0830 11/25/23 0934   11/25/23 0830  cefTRIAXone  (ROCEPHIN ) 2 g in sodium chloride  0.9 % 100 mL IVPB  Status:  Discontinued        2 g 200 mL/hr over 30 Minutes Intravenous Once 11/25/23 0817 11/25/23 0830   11/25/23 0830  vancomycin  (VANCOCIN ) IVPB 1000 mg/200 mL premix        1,000 mg 200 mL/hr over 60 Minutes Intravenous  Once 11/25/23 0817 11/25/23 1059       Procedures: 12-01-2023 I&D left medial thigh abscess  Consultants: ID General Surgery    Assessment and Plan: * Sepsis (HCC) On admission through 11-29-2023 Hypotension, tachycardia, elevated lactate 2.2 on presentation with known abscess given concern for severe sepsis.  Requiring multiple IV fluid boluses to maintain MAP greater than 65.  No vasopressor medications required at this time.  Blood pressure appears to be improving.  Continue to monitor closely.  Blood cultures drawn.   Broad-spectrum antibiotic coverage on board.  Appears to be resolving.  No longer requiring midodrine .   11-30-2023 resolved.   Cellulitis and abscess of left lower extremity On admission through 11-29-2023 CT scan on presentation noting mostly medial left thigh subcutaneous edema, but no walled off fluid collection to indicate abscess.  Likely etiology of patient's sepsis.  Broad-spectrum antibiotic coverage on board initially cefazolin  and vancomycin .  Transitioning to ceftriaxone  2 g every 24 hours secondary to pneumonia coverage on 5/4.  Small weeping area with induration, daily dressing change.  Will order wound care consult.  Given continued induration induration and tenderness, will repeat CT of the area to assess for developed underlying abscess.  If abscess is present, will likely need to get general surgery involved for I&D.    11-30-2023 repeat CT femur yesterday still shows no abscess. Pt has been on IV rocephin /vanco for 5 days now. While her labs are improved, pt still with clinically significant cellulitis. Will ask ID consult to see patient to see if there are any abx changes that need to be made.  I don't see good anaerobic coverage right now but for a medial thigh cellulitis, I'm not sure what organisms to target.  12-01-2023 s/p I&D in  OR today. 8 x 8 x 2 cm abscess/necrotic tissue was drained/debrided in OR. Cultures taken. IV dilaudid  prn ordered. Remains on IV zosyn  and po zyvox .   12-02-2023 continue with zyvox  and IV zosyn .  Left thigh still numb from Exparel /lidocaine  that was injected into wound edges. Afebrile. Intra-op wound cx growing staph aureus and GNR.  General surgery approved starting chemical DVT prophylaxis today.  Awaiting ID recs. Continue prn IV dilaudid .  12-03-2023 remains on IV zosyn /po zyvox . Awaiting final cultures. Will need home health RN for wound care. And ambulatory referral to wound care clinic.  COPD (chronic obstructive pulmonary disease)  (HCC) 11-30-2023 not convinced she has pneumonia. Pt does have hx of COPD. On IV rocephin . Not wheezing or hypoxic.  12-01-2023 stable   12-02-2023 stable  12-03-2023 stable  Chronic heart failure with preserved ejection fraction (HFpEF) (HCC) 11-30-2023 chronic. Not hypoxic. Lying flat in bed.  12-01-2023 stable  12-02-2023 stable  12-03-2023 stable  Essential hypertension 11-30-2023 stable. Pt with some swelling in left leg. Still restart low dose lasix  along with kcl supplementation.  12-01-2023 hold lasix  as her left leg swelling likely due to abscess/cellulitis.  12-02-2023 stable  12-03-2023 stable  Hypokalemia-resolved as of 11/30/2023 Treated and resolved.  Multiple myeloma in relapse (HCC) 12-01-2023 immunosuppressed due to MM.  12-02-2023 stable  12-03-2023 stable   DVT prophylaxis: enoxaparin  (LOVENOX ) injection 40 mg Start: 12/02/23 1800    Code Status: Full Code Family Communication: no family at bedside. Pt is decisional Disposition Plan: return home Reason for continuing need for hospitalization: remains on IV Abx.  Objective: Vitals:   12/02/23 1951 12/02/23 2357 12/03/23 0408 12/03/23 0805  BP: 114/61 117/64 139/67 134/77  Pulse: 77 67 (!) 57 (!) 55  Resp: 16 18 18 15   Temp: 98.4 F (36.9 C) 98.3 F (36.8 C) 98.4 F (36.9 C) 98.7 F (37.1 C)  TempSrc:  Oral  Oral  SpO2: 99% 100% 98% 99%  Weight:      Height:        Intake/Output Summary (Last 24 hours) at 12/03/2023 1112 Last data filed at 12/03/2023 0146 Gross per 24 hour  Intake 470.04 ml  Output --  Net 470.04 ml   Filed Weights   11/25/23 0649 12/01/23 0702  Weight: 71.2 kg 71.2 kg    Examination:  Physical Exam Vitals and nursing note reviewed.  Constitutional:      General: She is not in acute distress.    Appearance: She is normal weight. She is not toxic-appearing or diaphoretic.  HENT:     Head: Normocephalic and atraumatic.     Nose: Nose normal.   Cardiovascular:     Rate and Rhythm: Normal rate and regular rhythm.  Pulmonary:     Effort: Pulmonary effort is normal.     Breath sounds: Normal breath sounds.  Abdominal:     General: Bowel sounds are normal. There is no distension.     Palpations: Abdomen is soft.  Skin:    Capillary Refill: Capillary refill takes less than 2 seconds.     Comments: Left thigh with bulky dressing  Neurological:     Mental Status: She is alert and oriented to person, place, and time.     Data Reviewed: I have personally reviewed following labs and imaging studies  CBC: Recent Labs  Lab 11/27/23 0525 11/28/23 0545 11/29/23 0458 11/30/23 0635 12/02/23 0638  WBC 10.7* 6.5 3.3* 4.2 5.5  NEUTROABS  --   --   --   --  3.8  HGB 8.0* 8.4* 8.8* 10.2* 9.2*  HCT 24.3* 26.1* 27.2* 32.1* 30.3*  MCV 85.3 86.1 86.3 85.8 88.1  PLT 32* 78* 136* 234 330   Basic Metabolic Panel: Recent Labs  Lab 11/27/23 0525 11/28/23 0545 11/29/23 0458 11/30/23 0635 12/02/23 0638  NA 140 138 139 138 138  K 3.7 3.6 3.4* 3.5 3.7  CL 109 105 107 105 106  CO2 22 20* 23 26 23   GLUCOSE 70 66* 75 83 93  BUN 23 16 8  <5* 7*  CREATININE 0.82 0.70 0.55 0.71 0.87  CALCIUM  7.2* 7.3* 7.3* 7.8* 7.7*  MG 2.2 2.2 2.3 2.0  --   PHOS  --  2.9  --   --   --    GFR: Estimated Creatinine Clearance: 54.8 mL/min (by C-G formula based on SCr of 0.87 mg/dL). Liver Function Tests: Recent Labs  Lab 11/28/23 0545  ALBUMIN 2.8*   BNP (last 3 results) Recent Labs    03/30/23 0918  BNP 17.5   CBG: Recent Labs  Lab 12/02/23 1212 12/02/23 1852 12/02/23 2009 12/02/23 2356 12/03/23 0356  GLUCAP 92 86 95 88 102*    Recent Results (from the past 240 hours)  Blood culture (routine x 2)     Status: None   Collection Time: 11/25/23  7:03 AM   Specimen: BLOOD  Result Value Ref Range Status   Specimen Description BLOOD RIGHT ANTECUBITAL  Final   Special Requests   Final    BOTTLES DRAWN AEROBIC AND ANAEROBIC Blood Culture  adequate volume   Culture   Final    NO GROWTH 5 DAYS Performed at [facility], [address] [place], [place] [address]    Report Status 11/30/2023 FINAL  Final  Blood culture (routine x 2)     Status: None   Collection Time: 11/25/23  9:29 AM   Specimen: BLOOD  Result Value Ref Range Status   Specimen Description BLOOD BLOOD RIGHT HAND  Final   Special Requests   Final    BOTTLES DRAWN AEROBIC AND ANAEROBIC Blood Culture adequate volume   Culture   Final    NO GROWTH 5 DAYS Performed at [facility], [address], [place], [place] [address]    Report Status 11/30/2023 FINAL  Final  MRSA Next Gen by PCR, Nasal     Status: None   Collection Time: 11/25/23 12:59 PM   Specimen: Nasal Mucosa; Nasal Swab  Result Value Ref Range Status   MRSA by PCR Next Gen NOT DETECTED NOT DETECTED Final    Comment: (NOTE) The GeneXpert MRSA Assay (FDA approved for NASAL specimens only), is one component of a comprehensive MRSA colonization surveillance program. It is not intended to diagnose MRSA infection nor to guide or monitor treatment for MRSA infections. Test performance is not FDA approved in patients less than 27 years old. Performed at [facility], [address] [place], [place] [address]   Group A Strep by PCR     Status: None   Collection Time: 11/25/23 12:59 PM   Specimen: Nasal Mucosa; Sterile Swab  Result Value Ref Range Status   Group A Strep by PCR NOT DETECTED NOT DETECTED Final    Comment: Performed at [facility], [address] [place], [place] [address]  Expectorated Sputum Assessment w Gram Stain, Rflx to Resp Cult     Status: None   Collection Time: 11/27/23 12:15 PM   Specimen: Sputum  Result Value Ref Range Status  Specimen Description SPUTUM  Final   Special Requests NONE  Final   Sputum evaluation   Final    THIS SPECIMEN IS ACCEPTABLE FOR SPUTUM CULTURE Performed at [facility], [address]  [place], [place] [address]    Report Status 11/27/2023 FINAL  Final  Culture, Respiratory w Gram Stain     Status: None   Collection Time: 11/27/23 12:15 PM   Specimen: SPU  Result Value Ref Range Status   Specimen Description   Final    SPUTUM Performed at [facility], [address] [place], [place] [address]    Special Requests   Final    NONE Reflexed from [id number] Performed at [facility], [address] [place], [place] [address]    Gram Stain   Final    RARE WBC PRESENT, PREDOMINANTLY PMN FEW GRAM POSITIVE COCCI    Culture   Final    Normal respiratory flora-no Staph aureus or Pseudomonas seen Performed at [facility], [address] [place], [place] [address]    Report Status 11/29/2023 FINAL  Final  Aerobic Culture w Gram Stain (superficial specimen)     Status: None (Preliminary result)   Collection Time: 11/30/23  4:00 PM   Specimen: Wound  Result Value Ref Range Status   Specimen Description   Final    WOUND Performed at [facility], [address], [place], [place] [address]    Special Requests   Final     THIGH Performed at [facility], [address] [place], [place] [address]    Gram Stain NO WBC SEEN RARE GRAM NEGATIVE RODS   Final   Culture   Final    ABUNDANT STAPHYLOCOCCUS AUREUS ABUNDANT GRAM NEGATIVE RODS CULTURE REINCUBATED FOR BETTER GROWTH Performed at [facility], [address], [place], [place] [address]    Report Status PENDING  Incomplete  Aerobic/Anaerobic Culture w Gram Stain (surgical/deep wound)     Status: None (Preliminary result)   Collection Time: 12/01/23  8:19 AM   Specimen: Wound; Abscess  Result Value Ref Range Status   Specimen Description   Final    WOUND Performed at [facility], [address], [place], [place] [address]    Special Requests   Final    NONE Performed at [facility], [address] [place], [place] [address]    Gram Stain  NO WBC SEEN RARE GRAM POSITIVE COCCI IN PAIRS   Final   Culture   Final    ABUNDANT STAPHYLOCOCCUS AUREUS SUSCEPTIBILITIES TO FOLLOW CULTURE REINCUBATED FOR BETTER GROWTH Performed at [facility], [address], [place], [place] [address]    Report Status PENDING  Incomplete     Scheduled Meds:  aspirin  EC  81 mg Oral Daily   Chlorhexidine  Gluconate Cloth  6 each Topical Q0600   enoxaparin  (LOVENOX ) injection  40 mg Subcutaneous Q24H   linezolid   600 mg Oral Q12H   montelukast   10 mg Oral QHS   pantoprazole   40 mg Oral Daily   Continuous Infusions:  piperacillin -tazobactam (ZOSYN )  IV 3.375 g (12/03/23 0539)     LOS: 8 days   Time spent: 45 minutes  Unk Garb, DO  [facility]  12/03/2023, 11:12 AM

## 2023-12-04 DIAGNOSIS — I10 Essential (primary) hypertension: Secondary | ICD-10-CM | POA: Diagnosis not present

## 2023-12-04 DIAGNOSIS — I5032 Chronic diastolic (congestive) heart failure: Secondary | ICD-10-CM | POA: Diagnosis not present

## 2023-12-04 DIAGNOSIS — C9002 Multiple myeloma in relapse: Secondary | ICD-10-CM | POA: Diagnosis not present

## 2023-12-04 DIAGNOSIS — L03116 Cellulitis of left lower limb: Secondary | ICD-10-CM | POA: Diagnosis not present

## 2023-12-04 NOTE — Progress Notes (Signed)
 PROGRESS NOTE    Brandi [name]  ZOX:[id number] DOB: [birth year] DOA: 11/25/2023 PCP: [name], MD  Subjective: Patient seen and examined.  Pt without any pain in left thigh. Pt is getting out of bed and walking to bathroom.  Pt has port-a-cath.  Operative cultures growing MRSA, Morganella and E. Coli.  On IV zosyn /PO Zyvox  which covers all organisms.   Hospital Course: HPI: Brandi [name] is a 73 y.o. female with medical history significant of MM on chemotherapy, chronic HFpEF, HTN, hypothyroidism, presented with GI symptoms including nauseous vomiting abdominal pain.  She also complains about worsening of left leg swelling pain and drainage.   Patient has history of multiple myeloma since 2018 and has been on chemotherapy, last chemo was this past Tuesday.  Started on Wednesday, patient started to feel nauseous and cramping-like lower abdominal pain, with multiple vomiting on Wednesday but no more vomiting since Thursday.  Location of abdominal pain is rather poorly defined.  No exacerbating factors.  2 days ago she also started to notice " a boil" in the left inner thigh, gradually getting bigger and ruptured yesterday with thick yellowish pus coming out.  She has had some chills but no fever.  No chest pain shortness of breath no diarrhea denies any urinary symptoms.  Denied any active bleeding.  Significant Events: Admitted 11/25/2023 cellulitis 12-01-2023 operative I&D left thigh abscess 12-04-2023 operative cultures growing MRSA, morganella, E. Coli  Significant Labs: WBC 3.5, HgB 11.6, plt 63 Na 136, K 2.4, CO2 of 18, BUN 19, Scr 0.91. glu 90 Ca 7.7, TP 6.6, alb 3.8, AST 18, ALT 10, alk phos 64 Mg 1.5  Significant Imaging Studies: CT abd/pelvis, CTPA No CT evidence for acute pulmonary embolus. 2. No acute findings in the abdomen or pelvis. Specifically, no findings to explain the patient's history of abdominal pain. 3. Endometrial stripe appears thickened at 13 mm on  sagittal imaging. Correlation for abnormal vaginal bleeding recommended. Follow-up pelvic ultrasound recommended to further evaluate as endometrial neoplasm could have this appearance. 4. Calcified uterine fibroids. 5. Two 2 mm left lung nodules.  CT left femur Moderate medial and posteromedial and mild anteromedial proximal to mid left thigh subcutaneous fat edema and swelling. Moderate thickening of the regional left thigh skin. No walled-off fluid collection is seen to indicate an abscess. 2. Mild focal thickening/irregularity of the skin at the posterior aspect of the proximal to mid left thigh. This may represent the region of the reported posterior thigh weeping wound. 3. No high-grade fascial thickening, subcutaneous air, or perifascial air is seen to suggest CT evidence of necrotizing fasciitis. 4. No definite destructive lytic lesion is seen within the left femur in this patient with history of multiple myeloma. 5. 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 CT head Left middle ear and mastoid opacification is new since 2022. Consider with Acute Otitis Media with mastoid effusion. 2. 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 CXR Upper zone pulmonary vascular prominence raises the possibility of pulmonary venous hypertension. 2. Suspected linear/bandlike subsegmental atelectasis or scarring at the left lung base. 3. Aortic Atherosclerosis 11-29-2023 CT left femur Persistent subcutaneous edema and fat stranding extending along the left proximal to mid medial thigh with overlying skin thickening. Increased subcutaneous edema and fat stranding along the left hip and thigh with overlying skin thickening. These findings are compatible with cellulitis. No loculated fluid collection. 2. Increased  intramuscular edema in the left adductor  compartment musculature  with similar to slightly decreased stranding/edema extending along the left gracilis muscle, concerning for myositis. No discrete intramuscular collection identified  Antibiotic Therapy: Anti-infectives (From admission, onward)    Start     Dose/Rate Route Frequency Ordered Stop   11/27/23 0900  cefTRIAXone  (ROCEPHIN ) 2 g in sodium chloride  0.9 % 100 mL IVPB        2 g 200 mL/hr over 30 Minutes Intravenous Every 24 hours 11/27/23 0747 12/02/23 0859   11/26/23 1445  azithromycin  (ZITHROMAX ) tablet 500 mg        500 mg Oral Daily 11/26/23 1444 12/01/23 0959   11/26/23 1000  vancomycin  (VANCOCIN ) IVPB 1000 mg/200 mL premix        1,000 mg 200 mL/hr over 60 Minutes Intravenous Every 24 hours 11/25/23 1303 11/29/23 1232   11/25/23 1700  ceFAZolin  (ANCEF ) IVPB 1 g/50 mL premix  Status:  Discontinued        1 g 100 mL/hr over 30 Minutes Intravenous Every 8 hours 11/25/23 1307 11/27/23 0747   11/25/23 1330  vancomycin  (VANCOREADY) IVPB 500 mg/100 mL        500 mg 100 mL/hr over 60 Minutes Intravenous  Once 11/25/23 1247 11/25/23 1523   11/25/23 0845  piperacillin -tazobactam (ZOSYN ) IVPB 3.375 g        3.375 g 100 mL/hr over 30 Minutes Intravenous  Once 11/25/23 0830 11/25/23 0934   11/25/23 0830  cefTRIAXone  (ROCEPHIN ) 2 g in sodium chloride  0.9 % 100 mL IVPB  Status:  Discontinued        2 g 200 mL/hr over 30 Minutes Intravenous Once 11/25/23 0817 11/25/23 0830   11/25/23 0830  vancomycin  (VANCOCIN ) IVPB 1000 mg/200 mL premix        1,000 mg 200 mL/hr over 60 Minutes Intravenous  Once 11/25/23 0817 11/25/23 1059       Procedures: 12-01-2023 I&D left medial thigh abscess  Consultants: ID General Surgery    Assessment and Plan: * Sepsis (HCC) On admission through 11-29-2023 Hypotension, tachycardia, elevated lactate 2.2 on presentation with known abscess given concern for severe sepsis.  Requiring multiple IV fluid boluses to maintain MAP greater than 65.  No vasopressor medications  required at this time.  Blood pressure appears to be improving.  Continue to monitor closely.  Blood cultures drawn.  Broad-spectrum antibiotic coverage on board.  Appears to be resolving.  No longer requiring midodrine .   11-30-2023 resolved.   Cellulitis and abscess of left lower extremity On admission through 11-29-2023 CT scan on presentation noting mostly medial left thigh subcutaneous edema, but no walled off fluid collection to indicate abscess.  Likely etiology of patient's sepsis.  Broad-spectrum antibiotic coverage on board initially cefazolin  and vancomycin .  Transitioning to ceftriaxone  2 g every 24 hours secondary to pneumonia coverage on 5/4.  Small weeping area with induration, daily dressing change.  Will order wound care consult.  Given continued induration induration and tenderness, will repeat CT of the area to assess for developed underlying abscess.  If abscess is present, will likely need to get general surgery involved for I&D.    11-30-2023 repeat CT femur yesterday still shows no abscess. Pt has been on IV rocephin /vanco for 5 days now. While her labs are improved, pt still with clinically significant cellulitis. Will ask ID consult to see patient to see if there are any abx changes that need to be made.  I don't see good anaerobic coverage right  now but for a medial thigh cellulitis, I'm not sure what organisms to target.  12-01-2023 s/p I&D in OR today. 8 x 8 x 2 cm abscess/necrotic tissue was drained/debrided in OR. Cultures taken. IV dilaudid  prn ordered. Remains on IV zosyn  and po zyvox .   12-02-2023 continue with zyvox  and IV zosyn .  Left thigh still numb from Exparel /lidocaine  that was injected into wound edges. Afebrile. Intra-op wound cx growing staph aureus and GNR.  General surgery approved starting chemical DVT prophylaxis today.  Awaiting ID recs. Continue prn IV dilaudid .  12-03-2023 remains on IV zosyn /po zyvox . Awaiting final cultures. Will need home health RN  for wound care. And ambulatory referral to wound care clinic.  12-04-2023 Operative cultures growing MRSA, Morganella and E. Coli.  On IV zosyn /PO Zyvox  which covers all organisms. Pt has port-a-cath. So conceivably, she could get IV Zosyn  at home if ID thinks this is appropriate. Will await ID to return on Monday to decide on home abx regimen.  COPD (chronic obstructive pulmonary disease) (HCC) 11-30-2023 not convinced she has pneumonia. Pt does have hx of COPD. On IV rocephin . Not wheezing or hypoxic.  12-01-2023 stable   12-02-2023 stable  12-03-2023 stable  12-04-2023 stable  Chronic heart failure with preserved ejection fraction (HFpEF) (HCC) 11-30-2023 chronic. Not hypoxic. Lying flat in bed.  12-01-2023 stable  12-02-2023 stable  12-03-2023 stable  12-04-2023 stable  Essential hypertension 11-30-2023 stable. Pt with some swelling in left leg. Still restart low dose lasix  along with kcl supplementation.  12-01-2023 hold lasix  as her left leg swelling likely due to abscess/cellulitis.  12-02-2023 stable  12-03-2023 stable  12-04-2023 stable  Hypokalemia-resolved as of 11/30/2023 Treated and resolved.  Multiple myeloma in relapse (HCC) 12-01-2023 immunosuppressed due to MM.  12-02-2023 stable  12-03-2023 stable  12-04-2023 stable  DVT prophylaxis: enoxaparin  (LOVENOX ) injection 40 mg Start: 12/02/23 1800    Code Status: Full Code Family Communication: no family at bedside. Pt is decisional. Disposition Plan: return home Reason for continuing need for hospitalization: remains on IV ABX. Awaiting ID to see patient on Monday to decide on home abx regimen. Otherwise medically stable for DC.  Objective: Vitals:   12/03/23 1948 12/04/23 0009 12/04/23 0307 12/04/23 0746  BP: 139/68 (!) 142/60 (!) 147/78 133/66  Pulse: 62 (!) 56 (!) 59 (!) 55  Resp: 18 16  14   Temp: 97.7 F (36.5 C) 98.1 F (36.7 C) 98.1 F (36.7 C) 97.7 F (36.5 C)  TempSrc: Oral Oral  Oral   SpO2: 98% 98% 98% 99%  Weight:      Height:        Intake/Output Summary (Last 24 hours) at 12/04/2023 0911 Last data filed at 12/04/2023 0750 Gross per 24 hour  Intake 630 ml  Output 900 ml  Net -270 ml   Filed Weights   11/25/23 0649 12/01/23 0702  Weight: 71.2 kg 71.2 kg    Examination:  Physical Exam Vitals and nursing note reviewed.  Constitutional:      General: She is not in acute distress.    Appearance: She is normal weight. She is not toxic-appearing or diaphoretic.  HENT:     Head: Normocephalic and atraumatic.     Nose: Nose normal.  Cardiovascular:     Rate and Rhythm: Normal rate and regular rhythm.  Pulmonary:     Effort: Pulmonary effort is normal.     Breath sounds: Normal breath sounds.  Abdominal:     General: Bowel sounds are normal.  Palpations: Abdomen is soft.  Musculoskeletal:     Right lower leg: No edema.     Left lower leg: No edema.  Skin:    General: Skin is warm and dry.     Capillary Refill: Capillary refill takes less than 2 seconds.     Comments: Dressing overlying left thigh wound  Neurological:     General: No focal deficit present.     Mental Status: She is alert and oriented to person, place, and time.     Data Reviewed: I have personally reviewed following labs and imaging studies  CBC: Recent Labs  Lab 11/28/23 0545 11/29/23 0458 11/30/23 0635 12/02/23 0638  WBC 6.5 3.3* 4.2 5.5  NEUTROABS  --   --   --  3.8  HGB 8.4* 8.8* 10.2* 9.2*  HCT 26.1* 27.2* 32.1* 30.3*  MCV 86.1 86.3 85.8 88.1  PLT 78* 136* 234 330   Basic Metabolic Panel: Recent Labs  Lab 11/28/23 0545 11/29/23 0458 11/30/23 0635 12/02/23 0638  NA 138 139 138 138  K 3.6 3.4* 3.5 3.7  CL 105 107 105 106  CO2 20* 23 26 23   GLUCOSE 66* 75 83 93  BUN 16 8 <5* 7*  CREATININE 0.70 0.55 0.71 0.87  CALCIUM  7.3* 7.3* 7.8* 7.7*  MG 2.2 2.3 2.0  --   PHOS 2.9  --   --   --    GFR: Estimated Creatinine Clearance: 54.8 mL/min (by C-G  formula based on SCr of 0.87 mg/dL). Liver Function Tests: Recent Labs  Lab 11/28/23 0545  ALBUMIN 2.8*   BNP (last 3 results) Recent Labs    03/30/23 0918  BNP 17.5   CBG: Recent Labs  Lab 12/02/23 1212 12/02/23 1852 12/02/23 2009 12/02/23 2356 12/03/23 0356  GLUCAP 92 86 95 88 102*    Recent Results (from the past 240 hours)  Blood culture (routine x 2)     Status: None   Collection Time: 11/25/23  7:03 AM   Specimen: BLOOD  Result Value Ref Range Status   Specimen Description BLOOD RIGHT ANTECUBITAL  Final   Special Requests   Final    BOTTLES DRAWN AEROBIC AND ANAEROBIC Blood Culture adequate volume   Culture   Final    NO GROWTH 5 DAYS Performed at [facility], [address] [place], [place] [address]    Report Status 11/30/2023 FINAL  Final  Blood culture (routine x 2)     Status: None   Collection Time: 11/25/23  9:29 AM   Specimen: BLOOD  Result Value Ref Range Status   Specimen Description BLOOD BLOOD RIGHT HAND  Final   Special Requests   Final    BOTTLES DRAWN AEROBIC AND ANAEROBIC Blood Culture adequate volume   Culture   Final    NO GROWTH 5 DAYS Performed at [facility], [address] [place], [place] [address]    Report Status 11/30/2023 FINAL  Final  MRSA Next Gen by PCR, Nasal     Status: None   Collection Time: 11/25/23 12:59 PM   Specimen: Nasal Mucosa; Nasal Swab  Result Value Ref Range Status   MRSA by PCR Next Gen NOT DETECTED NOT DETECTED Final    Comment: (NOTE) The GeneXpert MRSA Assay (FDA approved for NASAL specimens only), is one component of a comprehensive MRSA colonization surveillance program. It is not intended to diagnose MRSA infection nor to guide or monitor treatment for MRSA infections. Test performance is not FDA approved in  patients less than 41 years old. Performed at [facility], [address] [place], [place] [address]   Group A Strep by PCR     Status: None   Collection  Time: 11/25/23 12:59 PM   Specimen: Nasal Mucosa; Sterile Swab  Result Value Ref Range Status   Group A Strep by PCR NOT DETECTED NOT DETECTED Final    Comment: Performed at [facility], [address] [place], [place] [address]  Expectorated Sputum Assessment w Gram Stain, Rflx to Resp Cult     Status: None   Collection Time: 11/27/23 12:15 PM   Specimen: Sputum  Result Value Ref Range Status   Specimen Description SPUTUM  Final   Special Requests NONE  Final   Sputum evaluation   Final    THIS SPECIMEN IS ACCEPTABLE FOR SPUTUM CULTURE Performed at [facility], [address] [place], [place] [address]    Report Status 11/27/2023 FINAL  Final  Culture, Respiratory w Gram Stain     Status: None   Collection Time: 11/27/23 12:15 PM   Specimen: SPU  Result Value Ref Range Status   Specimen Description   Final    SPUTUM Performed at [facility], [address] [place], [place] [address]    Special Requests   Final    NONE Reflexed from [id number] Performed at [facility], [address] [place], [place] [address]    Gram Stain   Final    RARE WBC PRESENT, PREDOMINANTLY PMN FEW GRAM POSITIVE COCCI    Culture   Final    Normal respiratory flora-no Staph aureus or Pseudomonas seen Performed at [facility], [address] [place], [place] [address]    Report Status 11/29/2023 FINAL  Final  Aerobic Culture w Gram Stain (superficial specimen)     Status: None   Collection Time: 11/30/23  4:00 PM   Specimen: Wound  Result Value Ref Range Status   Specimen Description   Final    WOUND Performed at [facility], [address] [place], [place] [address]    Special Requests   Final     THIGH Performed at [facility], [address] [place], [place] [address]    Gram Stain   Final    NO WBC SEEN RARE GRAM NEGATIVE RODS Performed at [facility], [address], [place], [place] [address]     Culture   Final    ABUNDANT METHICILLIN RESISTANT STAPHYLOCOCCUS AUREUS ABUNDANT ESCHERICHIA COLI Confirmed Extended Spectrum Beta-Lactamase Producer (ESBL).  In bloodstream infections from ESBL organisms, carbapenems are preferred over piperacillin /tazobactam. They are shown to have a lower risk of mortality. MODERATE MORGANELLA MORGANII    Report Status 12/03/2023 FINAL  Final   Organism ID, Bacteria METHICILLIN RESISTANT STAPHYLOCOCCUS AUREUS  Final   Organism ID, Bacteria ESCHERICHIA COLI  Final   Organism ID, Bacteria MORGANELLA MORGANII  Final      Susceptibility   Escherichia coli - MIC*    AMPICILLIN >=32 RESISTANT Resistant     CEFEPIME  16 RESISTANT Resistant     CEFTAZIDIME RESISTANT Resistant     CEFTRIAXONE  >=64 RESISTANT Resistant     CIPROFLOXACIN >=4 RESISTANT Resistant     GENTAMICIN <=1 SENSITIVE Sensitive     IMIPENEM <=0.25 SENSITIVE Sensitive     TRIMETH/SULFA >=320 RESISTANT Resistant     AMPICILLIN/SULBACTAM 4 SENSITIVE Sensitive     PIP/TAZO <=4 SENSITIVE Sensitive ug/mL    * ABUNDANT  ESCHERICHIA COLI   Morganella morganii - MIC*    AMPICILLIN >=32 RESISTANT Resistant     CEFTAZIDIME <=1 SENSITIVE Sensitive     CIPROFLOXACIN <=0.25 SENSITIVE Sensitive     GENTAMICIN <=1 SENSITIVE Sensitive     IMIPENEM 2 SENSITIVE Sensitive     TRIMETH/SULFA <=20 SENSITIVE Sensitive     AMPICILLIN/SULBACTAM >=32 RESISTANT Resistant     PIP/TAZO <=4 SENSITIVE Sensitive ug/mL    * MODERATE MORGANELLA MORGANII   Methicillin resistant staphylococcus aureus - MIC*    CIPROFLOXACIN >=8 RESISTANT Resistant     ERYTHROMYCIN >=8 RESISTANT Resistant     GENTAMICIN <=0.5 SENSITIVE Sensitive     OXACILLIN >=4 RESISTANT Resistant     TETRACYCLINE <=1 SENSITIVE Sensitive     VANCOMYCIN  1 SENSITIVE Sensitive     TRIMETH/SULFA >=320 RESISTANT Resistant     CLINDAMYCIN <=0.25 SENSITIVE Sensitive     RIFAMPIN <=0.5 SENSITIVE Sensitive     Inducible Clindamycin NEGATIVE Sensitive      LINEZOLID  2 SENSITIVE Sensitive     * ABUNDANT METHICILLIN RESISTANT STAPHYLOCOCCUS AUREUS  Aerobic/Anaerobic Culture w Gram Stain (surgical/deep wound)     Status: None (Preliminary result)   Collection Time: 12/01/23  8:19 AM   Specimen: Wound; Abscess  Result Value Ref Range Status   Specimen Description   Final    WOUND Performed at [facility], [address], [place], [place] [address]    Special Requests   Final    NONE Performed at [facility], [address] [place], [place] [address]    Gram Stain NO WBC SEEN RARE GRAM POSITIVE COCCI IN PAIRS   Final   Culture   Final    ABUNDANT METHICILLIN RESISTANT STAPHYLOCOCCUS AUREUS FEW ESCHERICHIA COLI Confirmed Extended Spectrum Beta-Lactamase Producer (ESBL).  In bloodstream infections from ESBL organisms, carbapenems are preferred over piperacillin /tazobactam. They are shown to have a lower risk of mortality. HOLDING FOR POSSIBLE ANAEROBE Performed at [facility], [address] [place], [place] [address]    Report Status PENDING  Incomplete   Organism ID, Bacteria METHICILLIN RESISTANT STAPHYLOCOCCUS AUREUS  Final   Organism ID, Bacteria ESCHERICHIA COLI  Final      Susceptibility   Escherichia coli - MIC*    AMPICILLIN >=32 RESISTANT Resistant     CEFEPIME  16 RESISTANT Resistant     CEFTAZIDIME RESISTANT Resistant     CEFTRIAXONE  >=64 RESISTANT Resistant     CIPROFLOXACIN >=4 RESISTANT Resistant     GENTAMICIN <=1 SENSITIVE Sensitive     IMIPENEM <=0.25 SENSITIVE Sensitive     TRIMETH/SULFA >=320 RESISTANT Resistant     AMPICILLIN/SULBACTAM 8 SENSITIVE Sensitive     PIP/TAZO <=4 SENSITIVE Sensitive ug/mL    * FEW ESCHERICHIA COLI   Methicillin resistant staphylococcus aureus - MIC*    CIPROFLOXACIN >=8 RESISTANT Resistant     ERYTHROMYCIN >=8 RESISTANT Resistant     GENTAMICIN <=0.5 SENSITIVE Sensitive     OXACILLIN >=4 RESISTANT Resistant     TETRACYCLINE <=1 SENSITIVE Sensitive      VANCOMYCIN  <=0.5 SENSITIVE Sensitive     TRIMETH/SULFA >=320 RESISTANT Resistant     CLINDAMYCIN <=0.25 SENSITIVE Sensitive     RIFAMPIN <=0.5 SENSITIVE Sensitive     Inducible Clindamycin NEGATIVE Sensitive     LINEZOLID  2 SENSITIVE Sensitive     * ABUNDANT METHICILLIN RESISTANT STAPHYLOCOCCUS AUREUS    Scheduled Meds:  aspirin  EC  81 mg Oral Daily   Chlorhexidine  Gluconate Cloth  6 each Topical  Q0600   enoxaparin  (LOVENOX ) injection  40 mg Subcutaneous Q24H   linezolid   600 mg Oral Q12H   montelukast   10 mg Oral QHS   pantoprazole   40 mg Oral Daily   Continuous Infusions:  piperacillin -tazobactam (ZOSYN )  IV 3.375 g (12/04/23 0536)     LOS: 9 days   Time spent: 45 minutes  Unk Garb, DO  [facility]  12/04/2023, 9:11 AM

## 2023-12-04 NOTE — Plan of Care (Signed)

## 2023-12-05 ENCOUNTER — Telehealth (HOSPITAL_COMMUNITY): Payer: Self-pay | Admitting: Pharmacy Technician

## 2023-12-05 ENCOUNTER — Other Ambulatory Visit: Payer: Self-pay

## 2023-12-05 ENCOUNTER — Other Ambulatory Visit (HOSPITAL_COMMUNITY): Payer: Self-pay

## 2023-12-05 DIAGNOSIS — B9562 Methicillin resistant Staphylococcus aureus infection as the cause of diseases classified elsewhere: Secondary | ICD-10-CM | POA: Diagnosis not present

## 2023-12-05 DIAGNOSIS — I5032 Chronic diastolic (congestive) heart failure: Secondary | ICD-10-CM | POA: Diagnosis not present

## 2023-12-05 DIAGNOSIS — I10 Essential (primary) hypertension: Secondary | ICD-10-CM | POA: Diagnosis not present

## 2023-12-05 DIAGNOSIS — L02416 Cutaneous abscess of left lower limb: Secondary | ICD-10-CM | POA: Diagnosis not present

## 2023-12-05 DIAGNOSIS — C9002 Multiple myeloma in relapse: Secondary | ICD-10-CM | POA: Diagnosis not present

## 2023-12-05 DIAGNOSIS — L03116 Cellulitis of left lower limb: Secondary | ICD-10-CM | POA: Diagnosis not present

## 2023-12-05 LAB — CBC WITH DIFFERENTIAL/PLATELET
Abs Immature Granulocytes: 0.04 10*3/uL (ref 0.00–0.07)
Basophils Absolute: 0 10*3/uL (ref 0.0–0.1)
Basophils Relative: 1 %
Eosinophils Absolute: 0.1 10*3/uL (ref 0.0–0.5)
Eosinophils Relative: 1 %
HCT: 32.1 % — ABNORMAL LOW (ref 36.0–46.0)
Hemoglobin: 10.1 g/dL — ABNORMAL LOW (ref 12.0–15.0)
Immature Granulocytes: 1 %
Lymphocytes Relative: 29 %
Lymphs Abs: 1.3 10*3/uL (ref 0.7–4.0)
MCH: 28 pg (ref 26.0–34.0)
MCHC: 31.5 g/dL (ref 30.0–36.0)
MCV: 88.9 fL (ref 80.0–100.0)
Monocytes Absolute: 0.2 10*3/uL (ref 0.1–1.0)
Monocytes Relative: 5 %
Neutro Abs: 2.7 10*3/uL (ref 1.7–7.7)
Neutrophils Relative %: 63 %
Platelets: 403 10*3/uL — ABNORMAL HIGH (ref 150–400)
RBC: 3.61 MIL/uL — ABNORMAL LOW (ref 3.87–5.11)
RDW: 17.6 % — ABNORMAL HIGH (ref 11.5–15.5)
Smear Review: NORMAL
WBC: 4.4 10*3/uL (ref 4.0–10.5)
nRBC: 0 % (ref 0.0–0.2)

## 2023-12-05 LAB — BASIC METABOLIC PANEL WITH GFR
Anion gap: 8 (ref 5–15)
BUN: 9 mg/dL (ref 8–23)
CO2: 23 mmol/L (ref 22–32)
Calcium: 8.1 mg/dL — ABNORMAL LOW (ref 8.9–10.3)
Chloride: 108 mmol/L (ref 98–111)
Creatinine, Ser: 0.86 mg/dL (ref 0.44–1.00)
GFR, Estimated: >60 mL/min (ref >60)
Glucose, Bld: 92 mg/dL (ref 70–99)
Potassium: 3.6 mmol/L (ref 3.5–5.1)
Sodium: 139 mmol/L (ref 135–145)

## 2023-12-05 LAB — AEROBIC/ANAEROBIC CULTURE W GRAM STAIN (SURGICAL/DEEP WOUND): Gram Stain: NONE SEEN

## 2023-12-05 MED ORDER — ERTAPENEM IV (FOR PTA / DISCHARGE USE ONLY): 1.0000 g | INTRAVENOUS | 0 refills | Status: AC

## 2023-12-05 MED ORDER — SODIUM CHLORIDE 0.9% FLUSH
10.0000 mL | INTRAVENOUS | 0 refills | Status: AC | PRN
Start: 2023-12-05 — End: 2024-01-04
  Filled 2023-12-05: qty 300, 30d supply, fill #0

## 2023-12-05 MED ORDER — SPIRONOLACTONE 25 MG PO TABS: 25.0000 mg | ORAL_TABLET | Freq: Every day | ORAL | Status: DC | PRN

## 2023-12-05 MED ORDER — LINEZOLID 600 MG PO TABS
600.0000 mg | ORAL_TABLET | Freq: Two times a day (BID) | ORAL | 0 refills | Status: AC
  Filled 2023-12-05: qty 14, 7d supply, fill #0

## 2023-12-05 MED ORDER — SODIUM CHLORIDE 0.9 % IV SOLN: 1.0000 g | INTRAVENOUS | Status: DC

## 2023-12-05 MED ORDER — OXYCODONE HCL 10 MG PO TABS
10.0000 mg | ORAL_TABLET | Freq: Every day | ORAL | 0 refills | Status: DC | PRN
  Filled 2023-12-05: qty 30, 30d supply, fill #0

## 2023-12-05 MED ORDER — OXYCODONE HCL 5 MG PO TABS
5.0000 mg | ORAL_TABLET | Freq: Once | ORAL | Status: AC
  Administered 2023-12-05: 5 mg via ORAL
  Filled 2023-12-05: qty 1

## 2023-12-05 MED ORDER — ONDANSETRON HCL 8 MG PO TABS
8.0000 mg | ORAL_TABLET | Freq: Three times a day (TID) | ORAL | 0 refills | Status: AC | PRN
  Filled 2023-12-05: qty 30, 10d supply, fill #0

## 2023-12-05 MED ORDER — SODIUM CHLORIDE 0.9 % IV SOLN
1.0000 g | INTRAVENOUS | Status: DC
  Administered 2023-12-05 – 2023-12-06 (×2): 1 g via INTRAVENOUS
  Filled 2023-12-05 (×2): qty 1000

## 2023-12-05 MED ORDER — FUROSEMIDE 40 MG PO TABS: 40.0000 mg | ORAL_TABLET | Freq: Every day | ORAL | Status: DC | PRN

## 2023-12-05 NOTE — Treatment Plan (Signed)
 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

## 2023-12-05 NOTE — Consult Note (Signed)
 WOC Nurse Consult Note: Reason for Consult: Apply VAC Wound type: Left Medial Thigh Necrotizing Cellulitis.  Pressure Injury POA: NA Measurement: 9 cm x 5 cm x 3 cm Wound bed: 90% red, 10% yellow. Drainage (amount, consistency, odor) Moderate amount, no odor, serous. Periwound: Intact. Dressing procedure/placement/frequency: Removed old NPWT dressing Cleansed wound with normal saline Periwound skin protected with window framed with drape  Filled wound with 1 piece of black foam  Sealed NPWT dressing at HG/13mmHG  Patient received IV/PO pain medication per bedside nurse prior to dressing change Patient tolerated procedure well.  WOC nurse will continue to provide NPWT dressing changed due to the complexity of the dressing change.Change twice a week.  The pt will be DC soon. The case manager is working to have a IT consultant at home. Already discussed the case with medical team in [facility].  Daughter was present at the end of the procedure.  WOC team will follow [facility] if the pt stays at the hospital.   Please reconsult if further assistance is needed. Thank-you,  Rachel Budds BSN, RN, [employer], WOC  (Pager: [phone])

## 2023-12-05 NOTE — Progress Notes (Addendum)
 PROGRESS NOTE    Brandi [name]  ZOX:[id number] DOB: 1951-01-15 DOA: 11/25/2023 PCP: [name], MD  Subjective: Patient seen and examined.  Pt without any pain in left thigh. Pt is getting out of bed and walking to bathroom. Pt has port-a-cath.  Pt states she has had IV abx at home before via port-a-cath and that her dtr knows how to give IV abx at home.  Have messaged with ID and surgery about pt going home with IV ABX and home health.     Hospital Course: HPI: Brandi [name] is a 73 y.o. female with medical history significant of MM on chemotherapy, chronic HFpEF, HTN, hypothyroidism, presented with GI symptoms including nauseous vomiting abdominal pain.  She also complains about worsening of left leg swelling pain and drainage.   Patient has history of multiple myeloma since 2018 and has been on chemotherapy, last chemo was this past Tuesday.  Started on Wednesday, patient started to feel nauseous and cramping-like lower abdominal pain, with multiple vomiting on Wednesday but no more vomiting since Thursday.  Location of abdominal pain is rather poorly defined.  No exacerbating factors.  2 days ago she also started to notice " a boil" in the left inner thigh, gradually getting bigger and ruptured yesterday with thick yellowish pus coming out.  She has had some chills but no fever.  No chest pain shortness of breath no diarrhea denies any urinary symptoms.  Denied any active bleeding.  Significant Events: Admitted 11/25/2023 cellulitis 12-01-2023 operative I&D left thigh abscess 12-04-2023 operative cultures growing MRSA, morganella, E. Coli  Significant Labs: WBC 3.5, HgB 11.6, plt 63 Na 136, K 2.4, CO2 of 18, BUN 19, Scr 0.91. glu 90 Ca 7.7, TP 6.6, alb 3.8, AST 18, ALT 10, alk phos 64 Mg 1.5  Significant Imaging Studies: CT abd/pelvis, CTPA No CT evidence for acute pulmonary embolus. 2. No acute findings in the abdomen or pelvis. Specifically, no findings to explain the  patient's history of abdominal pain. 3. Endometrial stripe appears thickened at 13 mm on sagittal imaging. Correlation for abnormal vaginal bleeding recommended. Follow-up pelvic ultrasound recommended to further evaluate as endometrial neoplasm could have this appearance. 4. Calcified uterine fibroids. 5. Two 2 mm left lung nodules.  CT left femur Moderate medial and posteromedial and mild anteromedial proximal to mid left thigh subcutaneous fat edema and swelling. Moderate thickening of the regional left thigh skin. No walled-off fluid collection is seen to indicate an abscess. 2. Mild focal thickening/irregularity of the skin at the posterior aspect of the proximal to mid left thigh. This may represent the region of the reported posterior thigh weeping wound. 3. No high-grade fascial thickening, subcutaneous air, or perifascial air is seen to suggest CT evidence of necrotizing fasciitis. 4. No definite destructive lytic lesion is seen within the left femur in this patient with history of multiple myeloma. 5. 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 CT head Left middle ear and mastoid opacification is new since 2022. Consider with Acute Otitis Media with mastoid effusion. 2. 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 CXR Upper zone pulmonary vascular prominence raises the possibility of pulmonary venous hypertension. 2. Suspected linear/bandlike subsegmental atelectasis or scarring at the left lung base. 3. Aortic Atherosclerosis 11-29-2023 CT left femur Persistent subcutaneous edema and fat stranding extending along the left proximal to mid medial thigh with overlying skin thickening. Increased subcutaneous edema  and fat stranding along the left hip and thigh with overlying skin thickening. These findings are compatible with cellulitis. No loculated fluid  collection. 2. Increased intramuscular edema in the left adductor  compartment musculature with similar to slightly decreased stranding/edema extending along the left gracilis muscle, concerning for myositis. No discrete intramuscular collection identified  Antibiotic Therapy: Anti-infectives (From admission, onward)    Start     Dose/Rate Route Frequency Ordered Stop   11/27/23 0900  cefTRIAXone  (ROCEPHIN ) 2 g in sodium chloride  0.9 % 100 mL IVPB        2 g 200 mL/hr over 30 Minutes Intravenous Every 24 hours 11/27/23 0747 12/02/23 0859   11/26/23 1445  azithromycin  (ZITHROMAX ) tablet 500 mg        500 mg Oral Daily 11/26/23 1444 12/01/23 0959   11/26/23 1000  vancomycin  (VANCOCIN ) IVPB 1000 mg/200 mL premix        1,000 mg 200 mL/hr over 60 Minutes Intravenous Every 24 hours 11/25/23 1303 11/29/23 1232   11/25/23 1700  ceFAZolin  (ANCEF ) IVPB 1 g/50 mL premix  Status:  Discontinued        1 g 100 mL/hr over 30 Minutes Intravenous Every 8 hours 11/25/23 1307 11/27/23 0747   11/25/23 1330  vancomycin  (VANCOREADY) IVPB 500 mg/100 mL        500 mg 100 mL/hr over 60 Minutes Intravenous  Once 11/25/23 1247 11/25/23 1523   11/25/23 0845  piperacillin -tazobactam (ZOSYN ) IVPB 3.375 g        3.375 g 100 mL/hr over 30 Minutes Intravenous  Once 11/25/23 0830 11/25/23 0934   11/25/23 0830  cefTRIAXone  (ROCEPHIN ) 2 g in sodium chloride  0.9 % 100 mL IVPB  Status:  Discontinued        2 g 200 mL/hr over 30 Minutes Intravenous Once 11/25/23 0817 11/25/23 0830   11/25/23 0830  vancomycin  (VANCOCIN ) IVPB 1000 mg/200 mL premix        1,000 mg 200 mL/hr over 60 Minutes Intravenous  Once 11/25/23 0817 11/25/23 1059       Procedures: 12-01-2023 I&D left medial thigh abscess  Consultants: ID General Surgery    Assessment and Plan: * Sepsis (HCC) On admission through 11-29-2023 Hypotension, tachycardia, elevated lactate 2.2 on presentation with known abscess given concern for severe sepsis.   Requiring multiple IV fluid boluses to maintain MAP greater than 65.  No vasopressor medications required at this time.  Blood pressure appears to be improving.  Continue to monitor closely.  Blood cultures drawn.  Broad-spectrum antibiotic coverage on board.  Appears to be resolving.  No longer requiring midodrine .   11-30-2023 resolved.   Cellulitis and abscess of left lower extremity On admission through 11-29-2023 CT scan on presentation noting mostly medial left thigh subcutaneous edema, but no walled off fluid collection to indicate abscess.  Likely etiology of patient's sepsis.  Broad-spectrum antibiotic coverage on board initially cefazolin  and vancomycin .  Transitioning to ceftriaxone  2 g every 24 hours secondary to pneumonia coverage on 5/4.  Small weeping area with induration, daily dressing change.  Will order wound care consult.  Given continued induration induration and tenderness, will repeat CT of the area to assess for developed underlying abscess.  If abscess is present, will likely need to get general surgery involved for I&D.    11-30-2023 repeat CT femur yesterday still shows no abscess. Pt has been on IV rocephin /vanco for 5 days now. While her labs are improved, pt still with clinically significant cellulitis. Will ask ID  consult to see patient to see if there are any abx changes that need to be made.  I don't see good anaerobic coverage right now but for a medial thigh cellulitis, I'm not sure what organisms to target.  12-01-2023 s/p I&D in OR today. 8 x 8 x 2 cm abscess/necrotic tissue was drained/debrided in OR. Cultures taken. IV dilaudid  prn ordered. Remains on IV zosyn  and po zyvox .   12-02-2023 continue with zyvox  and IV zosyn .  Left thigh still numb from Exparel /lidocaine  that was injected into wound edges. Afebrile. Intra-op wound cx growing staph aureus and GNR.  General surgery approved starting chemical DVT prophylaxis today.  Awaiting ID recs. Continue prn IV  dilaudid .  12-03-2023 remains on IV zosyn /po zyvox . Awaiting final cultures. Will need home health RN for wound care. And ambulatory referral to wound care clinic.  12-04-2023 Operative cultures growing MRSA, Morganella and E. Coli.  On IV zosyn /PO Zyvox  which covers all organisms. Pt has port-a-cath. So conceivably, she could get IV Zosyn  at home if ID thinks this is appropriate. Will await ID to return on Monday to decide on home abx regimen.  12-05-2023 ID thinks pt can go home on IV Invanz and po zyvox . Will await final OPAT orders.  *update OPAT Orders Discharge antibiotics: Ertapenem 1 gram IV every 24hrs  AND PO linezolid   Duration: 7 days End Date: 12/12/23   Pt has PORT- port care   Labs weekly while on IV antibiotics: _X_ CBC with differential   _X_ CMP  COPD (chronic obstructive pulmonary disease) (HCC) 11-30-2023 not convinced she has pneumonia. Pt does have hx of COPD. On IV rocephin . Not wheezing or hypoxic.  12-01-2023 stable   12-02-2023 stable  12-03-2023 stable  12-04-2023 stable  12-05-2023 stable  Chronic heart failure with preserved ejection fraction (HFpEF) (HCC) 11-30-2023 chronic. Not hypoxic. Lying flat in bed.  12-01-2023 stable  12-02-2023 stable  12-03-2023 stable  12-04-2023 stable  12-05-2023 stable  Essential hypertension 11-30-2023 stable. Pt with some swelling in left leg. Still restart low dose lasix  along with kcl supplementation.  12-01-2023 hold lasix  as her left leg swelling likely due to abscess/cellulitis.  12-02-2023 stable  12-03-2023 stable  12-04-2023 stable  12-05-2023 stable  Hypokalemia-resolved as of 11/30/2023 Treated and resolved.  Multiple myeloma in relapse (HCC) 12-01-2023 immunosuppressed due to MM.  12-02-2023 stable  12-03-2023 stable  12-04-2023 stable  12-05-2023 stable   DVT prophylaxis: enoxaparin  (LOVENOX ) injection 40 mg Start: 12/02/23 1800    Code Status: Full Code Family  Communication: no family at bedside. She is decisional. Disposition Plan: return home Reason for continuing need for hospitalization: medically stable for DC.  Objective: Vitals:   12/04/23 1951 12/04/23 2344 12/05/23 0359 12/05/23 0748  BP: (!) 141/76 (!) 145/81 (!) 147/66 (!) 140/69  Pulse: 63 64 (!) 57 (!) 51  Resp:   17 18  Temp: 97.9 F (36.6 C) 97.8 F (36.6 C) 98.3 F (36.8 C) 98.4 F (36.9 C)  TempSrc: Oral Oral Oral Oral  SpO2: 99% 94% 98% 100%  Weight:      Height:        Intake/Output Summary (Last 24 hours) at 12/05/2023 1018 Last data filed at 12/05/2023 0428 Gross per 24 hour  Intake 270 ml  Output --  Net 270 ml   Filed Weights   11/25/23 0649 12/01/23 0702  Weight: 71.2 kg 71.2 kg    Examination:  Physical Exam Vitals and nursing note reviewed.  Constitutional:  General: She is not in acute distress.    Appearance: She is normal weight. She is not toxic-appearing or diaphoretic.  HENT:     Head: Normocephalic and atraumatic.     Nose: Nose normal.  Eyes:     General: No scleral icterus. Cardiovascular:     Rate and Rhythm: Normal rate and regular rhythm.  Pulmonary:     Effort: Pulmonary effort is normal.     Breath sounds: Normal breath sounds.  Abdominal:     General: Abdomen is flat. Bowel sounds are normal.     Palpations: Abdomen is soft.  Skin:    General: Skin is warm and dry.     Capillary Refill: Capillary refill takes less than 2 seconds.  Neurological:     General: No focal deficit present.     Mental Status: She is alert and oriented to person, place, and time.     Data Reviewed: I have personally reviewed following labs and imaging studies  CBC: Recent Labs  Lab 11/29/23 0458 11/30/23 0635 12/02/23 0638 12/05/23 0500  WBC 3.3* 4.2 5.5 4.4  NEUTROABS  --   --  3.8 2.7  HGB 8.8* 10.2* 9.2* 10.1*  HCT 27.2* 32.1* 30.3* 32.1*  MCV 86.3 85.8 88.1 88.9  PLT 136* 234 330 403*   Basic Metabolic Panel: Recent Labs   Lab 11/29/23 0458 11/30/23 0635 12/02/23 0638 12/05/23 0500  NA 139 138 138 139  K 3.4* 3.5 3.7 3.6  CL 107 105 106 108  CO2 23 26 23 23   GLUCOSE 75 83 93 92  BUN 8 <5* 7* 9  CREATININE 0.55 0.71 0.87 0.86  CALCIUM  7.3* 7.8* 7.7* 8.1*  MG 2.3 2.0  --   --    GFR: Estimated Creatinine Clearance: 55.4 mL/min (by C-G formula based on SCr of 0.86 mg/dL). BNP (last 3 results) Recent Labs    03/30/23 0918  BNP 17.5   CBG: Recent Labs  Lab 12/02/23 1212 12/02/23 1852 12/02/23 2009 12/02/23 2356 12/03/23 0356  GLUCAP 92 86 95 88 102*    Recent Results (from the past 240 hours)  MRSA Next Gen by PCR, Nasal     Status: None   Collection Time: 11/25/23 12:59 PM   Specimen: Nasal Mucosa; Nasal Swab  Result Value Ref Range Status   MRSA by PCR Next Gen NOT DETECTED NOT DETECTED Final    Comment: (NOTE) The GeneXpert MRSA Assay (FDA approved for NASAL specimens only), is one component of a comprehensive MRSA colonization surveillance program. It is not intended to diagnose MRSA infection nor to guide or monitor treatment for MRSA infections. Test performance is not FDA approved in patients less than 72 years old. Performed at [facility], [address] [place], [place] [address]   Group A Strep by PCR     Status: None   Collection Time: 11/25/23 12:59 PM   Specimen: Nasal Mucosa; Sterile Swab  Result Value Ref Range Status   Group A Strep by PCR NOT DETECTED NOT DETECTED Final    Comment: Performed at [facility], [address] [place], [place] [address]  Expectorated Sputum Assessment w Gram Stain, Rflx to Resp Cult     Status: None   Collection Time: 11/27/23 12:15 PM   Specimen: Sputum  Result Value Ref Range Status   Specimen Description SPUTUM  Final   Special Requests NONE  Final   Sputum evaluation   Final    THIS SPECIMEN IS ACCEPTABLE FOR  SPUTUM CULTURE Performed at [facility], [address] [place], [place]  [address]    Report Status 11/27/2023 FINAL  Final  Culture, Respiratory w Gram Stain     Status: None   Collection Time: 11/27/23 12:15 PM   Specimen: SPU  Result Value Ref Range Status   Specimen Description   Final    SPUTUM Performed at [facility], [address], [place], [place] [address]    Special Requests   Final    NONE Reflexed from [phone] Performed at [facility], [address] [place], [place] [address]    Gram Stain   Final    RARE WBC PRESENT, PREDOMINANTLY PMN FEW GRAM POSITIVE COCCI    Culture   Final    Normal respiratory flora-no Staph aureus or Pseudomonas seen Performed at [facility], [address] [place], [place] [address]    Report Status 11/29/2023 FINAL  Final  Aerobic Culture w Gram Stain (superficial specimen)     Status: None   Collection Time: 11/30/23  4:00 PM   Specimen: Wound  Result Value Ref Range Status   Specimen Description   Final    WOUND Performed at [facility], [address], [place], [place] [address]    Special Requests   Final     THIGH Performed at [facility], [address] [place], [place] [address]    Gram Stain   Final    NO WBC SEEN RARE GRAM NEGATIVE RODS Performed at [facility], [address], [place], [place] [address]    Culture   Final    ABUNDANT METHICILLIN RESISTANT STAPHYLOCOCCUS AUREUS ABUNDANT ESCHERICHIA COLI Confirmed Extended Spectrum Beta-Lactamase Producer (ESBL).  In bloodstream infections from ESBL organisms, carbapenems are preferred over piperacillin /tazobactam. They are shown to have a lower risk of mortality. MODERATE MORGANELLA MORGANII    Report Status 12/03/2023 FINAL  Final   Organism ID, Bacteria METHICILLIN RESISTANT STAPHYLOCOCCUS AUREUS  Final   Organism ID, Bacteria ESCHERICHIA COLI  Final   Organism ID, Bacteria MORGANELLA MORGANII  Final      Susceptibility   Escherichia coli - MIC*    AMPICILLIN >=32 RESISTANT  Resistant     CEFEPIME  16 RESISTANT Resistant     CEFTAZIDIME RESISTANT Resistant     CEFTRIAXONE  >=64 RESISTANT Resistant     CIPROFLOXACIN >=4 RESISTANT Resistant     GENTAMICIN <=1 SENSITIVE Sensitive     IMIPENEM <=0.25 SENSITIVE Sensitive     TRIMETH/SULFA >=320 RESISTANT Resistant     AMPICILLIN/SULBACTAM 4 SENSITIVE Sensitive     PIP/TAZO <=4 SENSITIVE Sensitive ug/mL    * ABUNDANT ESCHERICHIA COLI   Morganella morganii - MIC*    AMPICILLIN >=32 RESISTANT Resistant     CEFTAZIDIME <=1 SENSITIVE Sensitive     CIPROFLOXACIN <=0.25 SENSITIVE Sensitive     GENTAMICIN <=1 SENSITIVE Sensitive     IMIPENEM 2 SENSITIVE Sensitive     TRIMETH/SULFA <=20 SENSITIVE Sensitive     AMPICILLIN/SULBACTAM >=32 RESISTANT Resistant     PIP/TAZO <=4 SENSITIVE Sensitive ug/mL    * MODERATE MORGANELLA MORGANII   Methicillin resistant staphylococcus aureus - MIC*    CIPROFLOXACIN >=8 RESISTANT Resistant     ERYTHROMYCIN >=8 RESISTANT Resistant     GENTAMICIN <=0.5 SENSITIVE Sensitive     OXACILLIN >=4 RESISTANT Resistant     TETRACYCLINE <=1 SENSITIVE Sensitive     VANCOMYCIN  1 SENSITIVE Sensitive     TRIMETH/SULFA >=320 RESISTANT Resistant  CLINDAMYCIN <=0.25 SENSITIVE Sensitive     RIFAMPIN <=0.5 SENSITIVE Sensitive     Inducible Clindamycin NEGATIVE Sensitive     LINEZOLID  2 SENSITIVE Sensitive     * ABUNDANT METHICILLIN RESISTANT STAPHYLOCOCCUS AUREUS  Aerobic/Anaerobic Culture w Gram Stain (surgical/deep wound)     Status: None   Collection Time: 12/01/23  8:19 AM   Specimen: Wound; Abscess  Result Value Ref Range Status   Specimen Description   Final    WOUND Performed at [facility], [address] [place], [place] [address]    Special Requests   Final    NONE Performed at [facility], [address] [place], [place] [address]    Gram Stain NO WBC SEEN RARE GRAM POSITIVE COCCI IN PAIRS   Final   Culture   Final    ABUNDANT METHICILLIN RESISTANT  STAPHYLOCOCCUS AUREUS FEW ESCHERICHIA COLI Confirmed Extended Spectrum Beta-Lactamase Producer (ESBL).  In bloodstream infections from ESBL organisms, carbapenems are preferred over piperacillin /tazobactam. They are shown to have a lower risk of mortality. FEW BACTEROIDES SPECIES BETA LACTAMASE POSITIVE Performed at [facility], [address], [place], [place] [address]    Report Status 12/05/2023 FINAL  Final   Organism ID, Bacteria METHICILLIN RESISTANT STAPHYLOCOCCUS AUREUS  Final   Organism ID, Bacteria ESCHERICHIA COLI  Final      Susceptibility   Escherichia coli - MIC*    AMPICILLIN >=32 RESISTANT Resistant     CEFEPIME  16 RESISTANT Resistant     CEFTAZIDIME RESISTANT Resistant     CEFTRIAXONE  >=64 RESISTANT Resistant     CIPROFLOXACIN >=4 RESISTANT Resistant     GENTAMICIN <=1 SENSITIVE Sensitive     IMIPENEM <=0.25 SENSITIVE Sensitive     TRIMETH/SULFA >=320 RESISTANT Resistant     AMPICILLIN/SULBACTAM 8 SENSITIVE Sensitive     PIP/TAZO <=4 SENSITIVE Sensitive ug/mL    * FEW ESCHERICHIA COLI   Methicillin resistant staphylococcus aureus - MIC*    CIPROFLOXACIN >=8 RESISTANT Resistant     ERYTHROMYCIN >=8 RESISTANT Resistant     GENTAMICIN <=0.5 SENSITIVE Sensitive     OXACILLIN >=4 RESISTANT Resistant     TETRACYCLINE <=1 SENSITIVE Sensitive     VANCOMYCIN  <=0.5 SENSITIVE Sensitive     TRIMETH/SULFA >=320 RESISTANT Resistant     CLINDAMYCIN <=0.25 SENSITIVE Sensitive     RIFAMPIN <=0.5 SENSITIVE Sensitive     Inducible Clindamycin NEGATIVE Sensitive     LINEZOLID  2 SENSITIVE Sensitive     * ABUNDANT METHICILLIN RESISTANT STAPHYLOCOCCUS AUREUS     Radiology Studies: No results found.  Scheduled Meds:  aspirin  EC  81 mg Oral Daily   Chlorhexidine  Gluconate Cloth  6 each Topical Q0600   enoxaparin  (LOVENOX ) injection  40 mg Subcutaneous Q24H   linezolid   600 mg Oral Q12H   montelukast   10 mg Oral QHS   pantoprazole   40 mg Oral Daily   Continuous  Infusions:  ertapenem 1 g (12/05/23 0823)     LOS: 10 days   Time spent: 50 minutes  Unk Garb, DO  [facility]  12/05/2023, 10:18 AM

## 2023-12-05 NOTE — Progress Notes (Addendum)
 PHARMACY CONSULT NOTE FOR:  OUTPATIENT  PARENTERAL ANTIBIOTIC THERAPY (OPAT)  Indication: Necrotizing skin infection Regimen: Ertapenem 1g IV daily and PO Linezolid  600 mg twice daily End date: 12/12/23  IV antibiotic discharge orders are pended. To discharging provider:  please sign these orders via discharge navigator,  Select New Orders & click on the button choice - Manage This Unsigned Work.     Thank you for allowing pharmacy to be a part of this patient's care.  Brandi [name] Brandi [name] 12/05/2023, 10:26 AM

## 2023-12-05 NOTE — Progress Notes (Signed)
 CC: Left Medial Thigh Necrotizing Cellulitis Subjective: Patient seen sitting up in bed, doing well. She reports her pain is minimal. She is tolerating diet. She reports nursing starff changed her bandages this morning.   Objective: Vital signs in last 24 hours: Temp:  [97.5 F (36.4 C)-98.4 F (36.9 C)] 97.9 F (36.6 C) (05/12 1116) Pulse Rate:  [51-84] 55 (05/12 1116) Resp:  [14-18] 16 (05/12 1116) BP: (134-147)/(66-85) 134/76 (05/12 1116) SpO2:  [94 %-100 %] 100 % (05/12 1116) Last BM Date : 12/04/23  Intake/Output from previous day: 05/11 0701 - 05/12 0700 In: 270 [P.O.:120; IV Piggyback:150] Out: 400 [Urine:400] Intake/Output this shift: Total I/O In: 120 [P.O.:120] Out: 300 [Urine:300]  Physical exam:  With Dr. Francee Inch I took down wound. The wound is stellate, still some induration around the edges of the wound with some reactive erythema but no cellulitis. Wound base is red with some evidence of blood on bandage from middle of wound, no active bleeding. Area is sensate, there are no pockets of necrotic tissue or purulence.   Lab Results: CBC  Recent Labs    12/05/23 0500  WBC 4.4  HGB 10.1*  HCT 32.1*  PLT 403*   BMET Recent Labs    12/05/23 0500  NA 139  K 3.6  CL 108  CO2 23  GLUCOSE 92  BUN 9  CREATININE 0.86  CALCIUM  8.1*   PT/INR No results for input(s): "LABPROT", "INR" in the last 72 hours. ABG No results for input(s): "PHART", "HCO3" in the last 72 hours.  Invalid input(s): "PCO2", "PO2"  Studies/Results: No results found.  Anti-infectives: Anti-infectives (From admission, onward)    Start     Dose/Rate Route Frequency Ordered Stop   12/06/23 0000  ertapenem 1 g in sodium chloride  0.9 % 100 mL        1 g Intravenous Every 24 hours 12/05/23 1018     12/05/23 0900  ertapenem (INVANZ) 1 g in sodium chloride  0.9 % 100 mL IVPB        1 g 200 mL/hr over 30 Minutes Intravenous Every 24 hours 12/05/23 0745     12/05/23 0000  ertapenem  (INVANZ) IVPB        1 g Intravenous Every 24 hours 12/05/23 1018 12/12/23 2359   12/05/23 0000  linezolid  (ZYVOX ) 600 MG tablet        600 mg Oral 2 times daily 12/05/23 0825 12/12/23 2359   11/30/23 2200  piperacillin -tazobactam (ZOSYN ) IVPB 3.375 g  Status:  Discontinued        3.375 g 12.5 mL/hr over 240 Minutes Intravenous Every 8 hours 11/30/23 1703 12/05/23 0745   11/30/23 1800  linezolid  (ZYVOX ) tablet 600 mg        600 mg Oral Every 12 hours 11/30/23 1547     11/27/23 0900  cefTRIAXone  (ROCEPHIN ) 2 g in sodium chloride  0.9 % 100 mL IVPB  Status:  Discontinued        2 g 200 mL/hr over 30 Minutes Intravenous Every 24 hours 11/27/23 0747 11/30/23 1703   11/26/23 1445  azithromycin  (ZITHROMAX ) tablet 500 mg        500 mg Oral Daily 11/26/23 1444 11/30/23 0853   11/26/23 1000  vancomycin  (VANCOCIN ) IVPB 1000 mg/200 mL premix        1,000 mg 200 mL/hr over 60 Minutes Intravenous Every 24 hours 11/25/23 1303 11/29/23 1232   11/25/23 1700  ceFAZolin  (ANCEF ) IVPB 1 g/50 mL premix  Status:  Discontinued  1 g 100 mL/hr over 30 Minutes Intravenous Every 8 hours 11/25/23 1307 11/27/23 0747   11/25/23 1330  vancomycin  (VANCOREADY) IVPB 500 mg/100 mL        500 mg 100 mL/hr over 60 Minutes Intravenous  Once 11/25/23 1247 11/25/23 1523   11/25/23 0845  piperacillin -tazobactam (ZOSYN ) IVPB 3.375 g        3.375 g 100 mL/hr over 30 Minutes Intravenous  Once 11/25/23 0830 11/25/23 0934   11/25/23 0830  cefTRIAXone  (ROCEPHIN ) 2 g in sodium chloride  0.9 % 100 mL IVPB  Status:  Discontinued        2 g 200 mL/hr over 30 Minutes Intravenous Once 11/25/23 0817 11/25/23 0830   11/25/23 0830  vancomycin  (VANCOCIN ) IVPB 1000 mg/200 mL premix        1,000 mg 200 mL/hr over 60 Minutes Intravenous  Once 11/25/23 0817 11/25/23 1059       Assessment/Plan:  Patient with left thigh wound.  There is no ongoing necrosis of the tissue although there is induration surrounding the outside.  No further  debridement is warranted.  I discussed with the patient that if she continues with wet-to-dry dressings that she would have to change it every day.  However, the wound is amenable to a wound VAC.  This way she would only need to change it less frequently and home health could do this when they are there.  I have discussed this with the primary team and they are planning to get this placed.  Appreciate ID assistance for antibiotic regimen.  Once wound VAC is placed patient can follow-up with Dr. Ofilia Benton.  Severa Daniels, M.D. [facility]

## 2023-12-05 NOTE — TOC Progression Note (Signed)
 Transition of Care [facility]) - Progression Note    Patient Details  Name: Brandi [name] MRN: [id number] Date of Birth: 1951/03/28  Transition [facility] CM/SW Contact  Baird Bombard, RN Phone Number: 12/05/2023, 4:28 PM  Clinical Narrative:    Spoke with patient's daughter, Beauford Bounds, to advised her HH was arranged with [facility] for PT/OT/ HH/ HHA. She stated she had spoken with Pam from [employer]. She was aslo advised of MD recommendation ro wound vac and that wound vac is processing and may be delivered by tomorrow.          Expected Discharge Plan and Services         Expected Discharge Date: 12/05/23                                     Social Determinants of Health (SDOH) Interventions SDOH Screenings   Food Insecurity: No Food Insecurity (09/04/2022)  Housing: Low Risk  (09/04/2022)  Transportation Needs: No Transportation Needs (09/04/2022)  Utilities: Not At Risk (09/04/2022)  Depression (PHQ2-9): Low Risk  (10/14/2022)  Tobacco Use: Medium Risk (12/01/2023)    Readmission Risk Interventions    05/27/2021   10:42 AM  Readmission Risk Prevention Plan  Transportation Screening Complete  Medication Review (RN Care Manager) Complete  PCP or Specialist appointment within 3-5 days of discharge Complete  HRI or Home Care Consult Complete  SW Recovery Care/Counseling Consult Complete  Palliative Care Screening Complete  Skilled Nursing Facility Not Applicable

## 2023-12-05 NOTE — Plan of Care (Signed)
  Problem: Coping: Goal: Level of anxiety will decrease Outcome: Progressing   Problem: Elimination: Goal: Will not experience complications related to bowel motility Outcome: Progressing Goal: Will not experience complications related to urinary retention Outcome: Progressing   Problem: Pain Managment: Goal: General experience of comfort will improve and/or be controlled Outcome: Progressing   Problem: Safety: Goal: Ability to remain free from injury will improve Outcome: Progressing   Problem: Skin Integrity: Goal: Risk for impaired skin integrity will decrease Outcome: Progressing   Problem: Clinical Measurements: Goal: Ability to avoid or minimize complications of infection will improve Outcome: Progressing   Problem: Skin Integrity: Goal: Skin integrity will improve Outcome: Progressing

## 2023-12-05 NOTE — Telephone Encounter (Signed)
 Patient Product/process development scientist completed.    The patient is insured through [facility]. Patient has Medicare and is not eligible for a copay card, but may be able to apply for patient assistance or Medicare RX Payment Plan (Patient Must reach out to their plan, if eligible for payment plan), if available.    Ran test claim for linezolid (Zyvox) 600 mg and the current 7 day co-pay is $0.00.   This test claim was processed through [facility]- copay amounts may vary at other pharmacies due to pharmacy/plan contracts, or as the patient moves through the different stages of their insurance plan.     Roland Earl, CPHT Pharmacy Technician III Certified Patient Advocate [facility] Patient Advocate Team Direct Number: [phone]  Fax: [phone]

## 2023-12-05 NOTE — Discharge Summary (Addendum)
 [facility] Phys[facility] Discharge Summary   Patient name: Brandi [name]  Admit date:     11/25/2023  Discharge date: 12/05/2023  Attending Physician: Frank [place] [[id number]]  Discharge Physician: Unk Garb   PCP: [name], MD  Admitted From: Home   Disposition:  Home  Recommendations for Outpatient Follow-up:  Follow up with PCP in 1-2 weeks Ambulatory referral made to wound care clinic Please follow up on the following pending results:  Home Health:Yes PT/OT/RN/nurse aide Equipment/Devices: None  Discharge Condition:Stable CODE STATUS:FULL Diet recommendation: Heart Healthy Fluid Restriction: None  Hospital Summary: HPI: SHAKIERRA STRIKE is a 73 y.o. female with medical history significant of MM on chemotherapy, chronic HFpEF, HTN, hypothyroidism, presented with GI symptoms including nauseous vomiting abdominal pain.  She also complains about worsening of left leg swelling pain and drainage.   Patient has history of multiple myeloma since 2018 and has been on chemotherapy, last chemo was this past Tuesday.  Started on Wednesday, patient started to feel nauseous and cramping-like lower abdominal pain, with multiple vomiting on Wednesday but no more vomiting since Thursday.  Location of abdominal pain is rather poorly defined.  No exacerbating factors.  2 days ago she also started to notice " a boil" in the left inner thigh, gradually getting bigger and ruptured yesterday with thick yellowish pus coming out.  She has had some chills but no fever.  No chest pain shortness of breath no diarrhea denies any urinary symptoms.  Denied any active bleeding.  Significant Events: Admitted 11/25/2023 cellulitis 12-01-2023 operative I&D left thigh abscess 12-04-2023 operative cultures growing MRSA, morganella, E. Coli  Significant Labs: WBC 3.5, HgB 11.6, plt 63 Na 136, K 2.4, CO2 of 18, BUN 19, Scr 0.91. glu 90 Ca 7.7, TP 6.6, alb 3.8, AST 18, ALT 10, alk phos 64 Mg  1.5  Significant Imaging Studies: CT abd/pelvis, CTPA No CT evidence for acute pulmonary embolus. 2. No acute findings in the abdomen or pelvis. Specifically, no findings to explain the patient's history of abdominal pain. 3. Endometrial stripe appears thickened at 13 mm on sagittal imaging. Correlation for abnormal vaginal bleeding recommended. Follow-up pelvic ultrasound recommended to further evaluate as endometrial neoplasm could have this appearance. 4. Calcified uterine fibroids. 5. Two 2 mm left lung nodules.  CT left femur Moderate medial and posteromedial and mild anteromedial proximal to mid left thigh subcutaneous fat edema and swelling. Moderate thickening of the regional left thigh skin. No walled-off fluid collection is seen to indicate an abscess. 2. Mild focal thickening/irregularity of the skin at the posterior aspect of the proximal to mid left thigh. This may represent the region of the reported posterior thigh weeping wound. 3. No high-grade fascial thickening, subcutaneous air, or perifascial air is seen to suggest CT evidence of necrotizing fasciitis. 4. No definite destructive lytic lesion is seen within the left femur in this patient with history of multiple myeloma. 5. 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 CT head Left middle ear and mastoid opacification is new since 2022. Consider with Acute Otitis Media with mastoid effusion. 2. 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 CXR Upper zone pulmonary vascular prominence raises the possibility of pulmonary venous hypertension. 2. Suspected linear/bandlike subsegmental atelectasis or scarring at the left lung base. 3. Aortic Atherosclerosis 11-29-2023 CT left femur Persistent subcutaneous edema and fat stranding extending along the left proximal to mid medial  thigh with overlying skin  thickening. Increased subcutaneous edema and fat stranding along the left hip and thigh with overlying skin thickening. These findings are compatible with cellulitis. No loculated fluid collection. 2. Increased intramuscular edema in the left adductor  compartment musculature with similar to slightly decreased stranding/edema extending along the left gracilis muscle, concerning for myositis. No discrete intramuscular collection identified  Antibiotic Therapy: Anti-infectives (From admission, onward)    Start     Dose/Rate Route Frequency Ordered Stop   11/27/23 0900  cefTRIAXone  (ROCEPHIN ) 2 g in sodium chloride  0.9 % 100 mL IVPB        2 g 200 mL/hr over 30 Minutes Intravenous Every 24 hours 11/27/23 0747 12/02/23 0859   11/26/23 1445  azithromycin  (ZITHROMAX ) tablet 500 mg        500 mg Oral Daily 11/26/23 1444 12/01/23 0959   11/26/23 1000  vancomycin  (VANCOCIN ) IVPB 1000 mg/200 mL premix        1,000 mg 200 mL/hr over 60 Minutes Intravenous Every 24 hours 11/25/23 1303 11/29/23 1232   11/25/23 1700  ceFAZolin  (ANCEF ) IVPB 1 g/50 mL premix  Status:  Discontinued        1 g 100 mL/hr over 30 Minutes Intravenous Every 8 hours 11/25/23 1307 11/27/23 0747   11/25/23 1330  vancomycin  (VANCOREADY) IVPB 500 mg/100 mL        500 mg 100 mL/hr over 60 Minutes Intravenous  Once 11/25/23 1247 11/25/23 1523   11/25/23 0845  piperacillin -tazobactam (ZOSYN ) IVPB 3.375 g        3.375 g 100 mL/hr over 30 Minutes Intravenous  Once 11/25/23 0830 11/25/23 0934   11/25/23 0830  cefTRIAXone  (ROCEPHIN ) 2 g in sodium chloride  0.9 % 100 mL IVPB  Status:  Discontinued        2 g 200 mL/hr over 30 Minutes Intravenous Once 11/25/23 0817 11/25/23 0830   11/25/23 0830  vancomycin  (VANCOCIN ) IVPB 1000 mg/200 mL premix        1,000 mg 200 mL/hr over 60 Minutes Intravenous  Once 11/25/23 0817 11/25/23 1059       Procedures: 12-01-2023 I&D left medial thigh abscess  Consultants: ID General  Surgery   Hospital Course by Problem: * Sepsis (HCC) On admission through 11-29-2023 Hypotension, tachycardia, elevated lactate 2.2 on presentation with known abscess given concern for severe sepsis.  Requiring multiple IV fluid boluses to maintain MAP greater than 65.  No vasopressor medications required at this time.  Blood pressure appears to be improving.  Continue to monitor closely.  Blood cultures drawn.  Broad-spectrum antibiotic coverage on board.  Appears to be resolving.  No longer requiring midodrine .   11-30-2023 resolved.   Cellulitis and abscess of left lower extremity On admission through 11-29-2023 CT scan on presentation noting mostly medial left thigh subcutaneous edema, but no walled off fluid collection to indicate abscess.  Likely etiology of patient's sepsis.  Broad-spectrum antibiotic coverage on board initially cefazolin  and vancomycin .  Transitioning to ceftriaxone  2 g every 24 hours secondary to pneumonia coverage on 5/4.  Small weeping area with induration, daily dressing change.  Will order wound care consult.  Given continued induration induration and tenderness, will repeat CT of the area to assess for developed underlying abscess.  If abscess is present, will likely need to get general surgery involved for I&D.    11-30-2023 repeat CT femur yesterday still shows no abscess. Pt has been on IV rocephin /vanco for 5 days now. While her labs are improved, pt  still with clinically significant cellulitis. Will ask ID consult to see patient to see if there are any abx changes that need to be made.  I don't see good anaerobic coverage right now but for a medial thigh cellulitis, I'm not sure what organisms to target.  12-01-2023 s/p I&D in OR today. 8 x 8 x 2 cm abscess/necrotic tissue was drained/debrided in OR. Cultures taken. IV dilaudid  prn ordered. Remains on IV zosyn  and po zyvox .   12-02-2023 continue with zyvox  and IV zosyn .  Left thigh still numb from Exparel /lidocaine   that was injected into wound edges. Afebrile. Intra-op wound cx growing staph aureus and GNR.  General surgery approved starting chemical DVT prophylaxis today.  Awaiting ID recs. Continue prn IV dilaudid .  12-03-2023 remains on IV zosyn /po zyvox . Awaiting final cultures. Will need home health RN for wound care. And ambulatory referral to wound care clinic.  12-04-2023 Operative cultures growing MRSA, Morganella and E. Coli.  On IV zosyn /PO Zyvox  which covers all organisms. Pt has port-a-cath. So conceivably, she could get IV Zosyn  at home if ID thinks this is appropriate. Will await ID to return on Monday to decide on home abx regimen.  12-05-2023 ID thinks pt can go home on IV Invanz and po zyvox . Will await final OPAT orders.  *update OPAT Orders Discharge antibiotics: Ertapenem 1 gram IV every 24hrs  AND PO linezolid   Duration: 7 days End Date: 12/12/23   Pt has PORT- port care   Labs weekly while on IV antibiotics: _X_ CBC with differential   _X_ CMP  *update. General surgery wants pt go to home on wound vac and continue this as outpatient.   COPD (chronic obstructive pulmonary disease) (HCC) 11-30-2023 not convinced she has pneumonia. Pt does have hx of COPD. On IV rocephin . Not wheezing or hypoxic.  12-01-2023 stable   12-02-2023 stable  12-03-2023 stable  12-04-2023 stable  12-05-2023 stable  Chronic heart failure with preserved ejection fraction (HFpEF) (HCC) 11-30-2023 chronic. Not hypoxic. Lying flat in bed.  12-01-2023 stable  12-02-2023 stable  12-03-2023 stable  12-04-2023 stable  12-05-2023 stable  Essential hypertension 11-30-2023 stable. Pt with some swelling in left leg. Still restart low dose lasix  along with kcl supplementation.  12-01-2023 hold lasix  as her left leg swelling likely due to abscess/cellulitis.  12-02-2023 stable  12-03-2023 stable  12-04-2023 stable  12-05-2023 stable  Hypokalemia-resolved as of 11/30/2023 Treated  and resolved.  Multiple myeloma in relapse (HCC) 12-01-2023 immunosuppressed due to MM.  12-02-2023 stable  12-03-2023 stable  12-04-2023 stable  12-05-2023 stable    Discharge Diagnoses:  Principal Problem:   Sepsis (HCC) Active Problems:   Cellulitis and abscess of left lower extremity   Essential hypertension   Chronic heart failure with preserved ejection fraction (HFpEF) (HCC)   COPD (chronic obstructive pulmonary disease) (HCC)   Necrotizing cellulitis   Abscess   Multiple myeloma in relapse [facility])   Discharge Instructions  Discharge Instructions     [facility] pharmacist to adjust dose for Vancomycin , Aminoglycosides and other anti-infective therapies as requested by physician.   Complete by: As directed    [facility] to provide Cath Flo 2mg    Complete by: As directed    Administer for PICC line occlusion and as ordered by physician for other access device issues.   Ambulatory referral to Wound Clinic   Complete by: As directed    Left thigh wound, s/p I&D of abscess   Anaphylaxis Kit: Provided to treat any  anaphylactic reaction to the medication being provided to the patient if First Dose or when requested by physician   Complete by: As directed    Epinephrine  1mg /ml vial / amp: Administer 0.3mg  (0.23ml) subcutaneously once for moderate to severe anaphylaxis, nurse to call physician and pharmacy when reaction occurs and call 911 if needed for immediate care   Diphenhydramine  50mg /ml IV vial: Administer 25-50mg  IV/IM PRN for first dose reaction, rash, itching, mild reaction, nurse to call physician and pharmacy when reaction occurs   Sodium Chloride  0.9% NS 500ml IV: Administer if needed for hypovolemic blood pressure drop or as ordered by physician after call to physician with anaphylactic reaction   Call MD for:  difficulty breathing, headache or visual disturbances   Complete by: As directed    Call MD for:  extreme fatigue   Complete by:  As directed    Call MD for:  hives   Complete by: As directed    Call MD for:  persistant dizziness or light-headedness   Complete by: As directed    Call MD for:  persistant nausea and vomiting   Complete by: As directed    Call MD for:  redness, tenderness, or signs of infection (pain, swelling, redness, odor or green/yellow discharge around incision site)   Complete by: As directed    Call MD for:  severe uncontrolled pain   Complete by: As directed    Call MD for:  temperature >100.4   Complete by: As directed    Change dressing on IV access line weekly and PRN   Complete by: As directed    Diet - low sodium heart healthy   Complete by: As directed    Discharge instructions   Complete by: As directed    1. Follow up with your primary care provider in 1-2 weeks following discharge from hospital. 2. Ambulatory referral made to wound care clinic.   Discharge wound care:   Complete by: As directed    Wound: left medial thigh Wound care  Daily  Comments: Dressing change: every day using Kling roll, wet with normal saline solution (NSS). Remove old,  Replace packing damp to wet with NSS, fill the wound to skin edges, and cover with ABD-like absorbant pad.  Secure with limited tape, and underwear.   Flush IV access with Sodium Chloride  0.9% and Heparin  10 units/ml or 100 units/ml   Complete by: As directed    Home infusion instructions - [facility]   Complete by: As directed    Instructions: Flush IV access with Sodium Chloride  0.9% and Heparin  10units/ml or 100units/ml   Change dressing on IV access line: Weekly and PRN   Instructions Cath Flo 2mg : Administer for PICC Line occlusion and as ordered by physician for other access device   [facility] pharmacist to adjust dose for: Vancomycin , Aminoglycosides and other anti-infective therapies as requested by physician   Increase activity slowly   Complete by: As directed    Method of administration may be  changed at the discretion of home infusion pharmacist based upon assessment of the patient and/or caregiver's ability to self-administer the medication ordered   Complete by: As directed    Outpatient Parenteral Antibiotic Therapy Information Antibiotic: Ertapenem (Invanz) IVPB; Indications for use: SSTI w/ ESBL Organism; End Date: 12/12/2023   Complete by: As directed    Antibiotic: Ertapenem Janeane Mealy) IVPB   Indications for use: SSTI w/ ESBL Organism   End Date: 12/12/2023      Allergies  as of 12/05/2023   No Known Allergies      Medication List     STOP taking these medications    potassium chloride  SA 20 MEQ tablet Commonly known as: KLOR-CON  M       TAKE these medications    albuterol  108 (90 Base) MCG/ACT inhaler Commonly known as: VENTOLIN  HFA Inhale 2 puffs into the lungs every 6 (six) hours as needed for wheezing or shortness of breath.   aspirin  EC 81 MG tablet Take 1 tablet (81 mg total) by mouth daily.   calcium  carbonate 600 MG Tabs tablet Commonly known as: Calcium  600 High Potency Take 2 tablets (1,200 mg total) by mouth 2 (two) times daily.   ertapenem 1 g in sodium chloride  0.9 % 100 mL Inject 1 g into the vein daily. Start taking on: Dec 06, 2023   ertapenem IVPB Commonly known as: INVANZ Inject 1 g into the vein daily for 7 days. Indication:  ESBL SSTI First Dose: Yes Last Day of Therapy:  12/12/23 Labs - Once weekly:  CBC/D and CMP Method of administration: Mini-Bag Plus / Gravity Method of administration may be changed at the discretion of home infusion pharmacist based upon assessment of the patient and/or caregiver's ability to self-administer the medication ordered. Fax weekly lab results  promptly to [phone]   furosemide  40 MG tablet Commonly known as: Lasix  Take 1 tablet (40 mg total) by mouth daily as needed for edema or fluid.   levothyroxine  175 MCG tablet Commonly known as: Synthroid  Take 1 tablet (175 mcg total) by mouth  daily. For thyroid    lidocaine -prilocaine  cream Commonly known as: EMLA  Apply 1 application topically as needed. Apply to port and cover with saran wrap 1-2 hours prior to port access   linezolid  600 MG tablet Commonly known as: ZYVOX  Take 1 tablet (600 mg total) by mouth 2 (two) times daily for 7 days. Take through 12/12/2023.   magnesium  oxide 400 (240 Mg) MG tablet Commonly known as: MAG-OX Take 1 tablet (400 mg total) by mouth 2 (two) times daily.   montelukast  10 MG tablet Commonly known as: SINGULAIR  Take 1 tablet (10 mg total) by mouth at bedtime.   ondansetron  8 MG tablet Commonly known as: ZOFRAN  Take 1 tablet (8 mg total) by mouth every 8 (eight) hours as needed for nausea or vomiting. What changed:  how much to take how to take this when to take this reasons to take this additional instructions   Oxycodone  HCl 10 MG Tabs Take 1 tablet (10 mg total) by mouth daily as needed (prior to dressing changes).   pantoprazole  40 MG tablet Commonly known as: PROTONIX  Take 1 tablet (40 mg total) by mouth daily.   polyethylene glycol 17 g packet Commonly known as: MIRALAX  / GLYCOLAX  Take 17 g by mouth daily as needed for mild constipation.   Simbrinza 1-0.2 % Susp Generic drug: Brinzolamide -Brimonidine  Apply 1 drop to eye in the morning and at bedtime. I drop both eyes twice a day   sodium chloride  flush 0.9 % Soln injection 10-40 mLs by Intracatheter route as needed (flush).   spironolactone  25 MG tablet Commonly known as: ALDACTONE  Take 1 tablet (25 mg total) by mouth daily as needed (edema). What changed:  when to take this reasons to take this   traMADol  50 MG tablet Commonly known as: ULTRAM  Take 1 tablet (50 mg total) by mouth every 12 (twelve) hours as needed. What changed: reasons to take this   Vitamin  D (Ergocalciferol ) 1.25 MG (50000 UNIT) Caps capsule Commonly known as: DRISDOL  Take 1 capsule by mouth once a week               Discharge  Care Instructions  (From admission, onward)           Start     Ordered   12/05/23 0000  Change dressing on IV access line weekly and PRN  (Home infusion instructions - [facility] )        12/05/23 1018   12/05/23 0000  Discharge wound care:       Comments: Wound: left medial thigh Wound care  Daily  Comments: Dressing change: every day using Kling roll, wet with normal saline solution (NSS). Remove old,  Replace packing damp to wet with NSS, fill the wound to skin edges, and cover with ABD-like absorbant pad.  Secure with limited tape, and underwear.   12/05/23 1021            No Known Allergies  Discharge Exam: Vitals:   12/05/23 0748 12/05/23 1116  BP: (!) 140/69 134/76  Pulse: (!) 51 (!) 55  Resp: 18 16  Temp: 98.4 F (36.9 C) 97.9 F (36.6 C)  SpO2: 100% 100%    Physical Exam Vitals and nursing note reviewed.  Constitutional:      General: She is not in acute distress.    Appearance: She is normal weight. She is not toxic-appearing or diaphoretic.  HENT:     Head: Normocephalic and atraumatic.     Nose: Nose normal.  Eyes:     General: No scleral icterus. Cardiovascular:     Rate and Rhythm: Normal rate and regular rhythm.  Pulmonary:     Effort: Pulmonary effort is normal.     Breath sounds: Normal breath sounds.  Abdominal:     General: Abdomen is flat. Bowel sounds are normal.     Palpations: Abdomen is soft.  Skin:    General: Skin is warm and dry.     Capillary Refill: Capillary refill takes less than 2 seconds.  Neurological:     General: No focal deficit present.     Mental Status: She is alert and oriented to person, place, and time.     The results of significant diagnostics from this hospitalization (including imaging, microbiology, ancillary and laboratory) are listed below for reference.    Microbiology: Recent Results (from the past 240 hours)  Expectorated Sputum Assessment w Gram Stain, Rflx to Resp Cult     Status:  None   Collection Time: 11/27/23 12:15 PM   Specimen: Sputum  Result Value Ref Range Status   Specimen Description SPUTUM  Final   Special Requests NONE  Final   Sputum evaluation   Final    THIS SPECIMEN IS ACCEPTABLE FOR SPUTUM CULTURE Performed at [facility], [address], [place], [place] [address]    Report Status 11/27/2023 FINAL  Final  Culture, Respiratory w Gram Stain     Status: None   Collection Time: 11/27/23 12:15 PM   Specimen: SPU  Result Value Ref Range Status   Specimen Description   Final    SPUTUM Performed at [facility], [address] [place], [place] [address]    Special Requests   Final    NONE Reflexed from [id number] Performed at [facility], [address] [place], [place] [address]    Gram Stain   Final    RARE WBC PRESENT, PREDOMINANTLY  PMN FEW GRAM POSITIVE COCCI    Culture   Final    Normal respiratory flora-no Staph aureus or Pseudomonas seen Performed at [facility], [address] [place], [place] [address]    Report Status 11/29/2023 FINAL  Final  Aerobic Culture w Gram Stain (superficial specimen)     Status: None   Collection Time: 11/30/23  4:00 PM   Specimen: Wound  Result Value Ref Range Status   Specimen Description   Final    WOUND Performed at [facility], [address] [place], [place] [address]    Special Requests   Final     THIGH Performed at [facility], [address] [place], [place] [address]    Gram Stain   Final    NO WBC SEEN RARE GRAM NEGATIVE RODS Performed at [facility], [address] [place], [place] [address]    Culture   Final    ABUNDANT METHICILLIN RESISTANT STAPHYLOCOCCUS AUREUS ABUNDANT ESCHERICHIA COLI Confirmed Extended Spectrum Beta-Lactamase Producer (ESBL).  In bloodstream infections from ESBL organisms, carbapenems are preferred over piperacillin /tazobactam. They are shown to have a lower risk of mortality. MODERATE  MORGANELLA MORGANII    Report Status 12/03/2023 FINAL  Final   Organism ID, Bacteria METHICILLIN RESISTANT STAPHYLOCOCCUS AUREUS  Final   Organism ID, Bacteria ESCHERICHIA COLI  Final   Organism ID, Bacteria MORGANELLA MORGANII  Final      Susceptibility   Escherichia coli - MIC*    AMPICILLIN >=32 RESISTANT Resistant     CEFEPIME  16 RESISTANT Resistant     CEFTAZIDIME RESISTANT Resistant     CEFTRIAXONE  >=64 RESISTANT Resistant     CIPROFLOXACIN >=4 RESISTANT Resistant     GENTAMICIN <=1 SENSITIVE Sensitive     IMIPENEM <=0.25 SENSITIVE Sensitive     TRIMETH/SULFA >=320 RESISTANT Resistant     AMPICILLIN/SULBACTAM 4 SENSITIVE Sensitive     PIP/TAZO <=4 SENSITIVE Sensitive ug/mL    * ABUNDANT ESCHERICHIA COLI   Morganella morganii - MIC*    AMPICILLIN >=32 RESISTANT Resistant     CEFTAZIDIME <=1 SENSITIVE Sensitive     CIPROFLOXACIN <=0.25 SENSITIVE Sensitive     GENTAMICIN <=1 SENSITIVE Sensitive     IMIPENEM 2 SENSITIVE Sensitive     TRIMETH/SULFA <=20 SENSITIVE Sensitive     AMPICILLIN/SULBACTAM >=32 RESISTANT Resistant     PIP/TAZO <=4 SENSITIVE Sensitive ug/mL    * MODERATE MORGANELLA MORGANII   Methicillin resistant staphylococcus aureus - MIC*    CIPROFLOXACIN >=8 RESISTANT Resistant     ERYTHROMYCIN >=8 RESISTANT Resistant     GENTAMICIN <=0.5 SENSITIVE Sensitive     OXACILLIN >=4 RESISTANT Resistant     TETRACYCLINE <=1 SENSITIVE Sensitive     VANCOMYCIN  1 SENSITIVE Sensitive     TRIMETH/SULFA >=320 RESISTANT Resistant     CLINDAMYCIN <=0.25 SENSITIVE Sensitive     RIFAMPIN <=0.5 SENSITIVE Sensitive     Inducible Clindamycin NEGATIVE Sensitive     LINEZOLID  2 SENSITIVE Sensitive     * ABUNDANT METHICILLIN RESISTANT STAPHYLOCOCCUS AUREUS  Aerobic/Anaerobic Culture w Gram Stain (surgical/deep wound)     Status: None   Collection Time: 12/01/23  8:19 AM   Specimen: Wound; Abscess  Result Value Ref Range Status   Specimen Description   Final    WOUND Performed at  [facility], [address], [place], [place] [address]    Special Requests   Final    NONE Performed at [facility], [address] [place], [place] [address]    Gram Stain NO WBC SEEN RARE GRAM POSITIVE COCCI IN PAIRS   Final   Culture   Final    ABUNDANT METHICILLIN RESISTANT STAPHYLOCOCCUS AUREUS FEW ESCHERICHIA COLI Confirmed Extended Spectrum Beta-Lactamase Producer (ESBL).  In bloodstream infections from ESBL organisms, carbapenems are preferred over piperacillin /tazobactam. They are shown to have a lower risk of mortality. FEW BACTEROIDES SPECIES BETA LACTAMASE POSITIVE Performed at [facility], [address] [place], [place] [address]    Report Status 12/05/2023 FINAL  Final   Organism ID, Bacteria METHICILLIN RESISTANT STAPHYLOCOCCUS AUREUS  Final   Organism ID, Bacteria ESCHERICHIA COLI  Final      Susceptibility   Escherichia coli - MIC*    AMPICILLIN >=32 RESISTANT Resistant     CEFEPIME  16 RESISTANT Resistant     CEFTAZIDIME RESISTANT Resistant     CEFTRIAXONE  >=64 RESISTANT Resistant     CIPROFLOXACIN >=4 RESISTANT Resistant     GENTAMICIN <=1 SENSITIVE Sensitive     IMIPENEM <=0.25 SENSITIVE Sensitive     TRIMETH/SULFA >=320 RESISTANT Resistant     AMPICILLIN/SULBACTAM 8 SENSITIVE Sensitive     PIP/TAZO <=4 SENSITIVE Sensitive ug/mL    * FEW ESCHERICHIA COLI   Methicillin resistant staphylococcus aureus - MIC*    CIPROFLOXACIN >=8 RESISTANT Resistant     ERYTHROMYCIN >=8 RESISTANT Resistant     GENTAMICIN <=0.5 SENSITIVE Sensitive     OXACILLIN >=4 RESISTANT Resistant     TETRACYCLINE <=1 SENSITIVE Sensitive     VANCOMYCIN  <=0.5 SENSITIVE Sensitive     TRIMETH/SULFA >=320 RESISTANT Resistant     CLINDAMYCIN <=0.25 SENSITIVE Sensitive     RIFAMPIN <=0.5 SENSITIVE Sensitive     Inducible Clindamycin NEGATIVE Sensitive     LINEZOLID  2 SENSITIVE Sensitive     * ABUNDANT METHICILLIN RESISTANT STAPHYLOCOCCUS AUREUS      Labs: BNP (last 3 results) Recent Labs    03/30/23 0918  BNP 17.5   Basic Metabolic Panel: Recent Labs  Lab 11/29/23 0458 11/30/23 0635 12/02/23 0638 12/05/23 0500  NA 139 138 138 139  K 3.4* 3.5 3.7 3.6  CL 107 105 106 108  CO2 23 26 23 23   GLUCOSE 75 83 93 92  BUN 8 <5* 7* 9  CREATININE 0.55 0.71 0.87 0.86  CALCIUM  7.3* 7.8* 7.7* 8.1*  MG 2.3 2.0  --   --    CBC: Recent Labs  Lab 11/29/23 0458 11/30/23 0635 12/02/23 0638 12/05/23 0500  WBC 3.3* 4.2 5.5 4.4  NEUTROABS  --   --  3.8 2.7  HGB 8.8* 10.2* 9.2* 10.1*  HCT 27.2* 32.1* 30.3* 32.1*  MCV 86.3 85.8 88.1 88.9  PLT 136* 234 330 403*   CBG: Recent Labs  Lab 12/02/23 1212 12/02/23 1852 12/02/23 2009 12/02/23 2356 12/03/23 0356  GLUCAP 92 86 95 88 102*   Urinalysis    Component Value Date/Time   COLORURINE YELLOW (A) 11/25/2023 1700   APPEARANCEUR HAZY (A) 11/25/2023 1700   LABSPEC >1.046 (H) 11/25/2023 1700   PHURINE 5.0 11/25/2023 1700   GLUCOSEU NEGATIVE 11/25/2023 1700   HGBUR MODERATE (A) 11/25/2023 1700   BILIRUBINUR NEGATIVE 11/25/2023 1700   KETONESUR NEGATIVE 11/25/2023 1700   PROTEINUR 30 (A) 11/25/2023 1700   NITRITE NEGATIVE 11/25/2023 1700   LEUKOCYTESUR SMALL (A) 11/25/2023 1700   Sepsis Labs Recent Labs  Lab 11/29/23 0458 11/30/23 0635 12/02/23 0638 12/05/23 0500  WBC 3.3* 4.2 5.5 4.4    Procedures/Studies: CT  FEMUR LEFT W CONTRAST Result Date: 11/29/2023 CLINICAL DATA:  Sepsis.  Left upper thigh cellulitis. EXAM: CT OF THE LOWER LEFT EXTREMITY WITH CONTRAST TECHNIQUE: Multidetector CT imaging of the lower left extremity was performed according to the standard protocol following intravenous contrast administration. RADIATION DOSE REDUCTION: This exam was performed according to the departmental dose-optimization program which includes automated exposure control, adjustment of the mA and/or kV according to patient size and/or use of iterative reconstruction technique.  CONTRAST:  OMNIPAQUE  IOHEXOL  300 MG/ML  SOLN COMPARISON:  CT left femur dated 11/25/2023. FINDINGS: Bones/Joint/Cartilage No acute fracture or dislocation. No destructive osseous lesion. Moderate degenerative changes of the left hip with joint space narrowing and marginal osteophytosis. Degenerative changes of the pubic symphysis. Moderate degenerative changes of the left knee. Ligaments Ligaments are suboptimally evaluated by CT. Soft tissue and Muscles Persistent subcutaneous edema and fat stranding extending along the left proximal to mid medial thigh with overlying skin thickening. Increased subcutaneous edema and fat stranding along the left hip and thigh with overlying skin thickening. There is increased intramuscular edema within the left adductor compartment musculature with similar to slightly decreased stranding/edema extending along the left gracilis muscle. No discrete loculated fluid collection identified. No soft tissue gas. Irregularity of the skin along the posterior proximal to mid left thigh is again noted and could reflect a wound. Mild fatty infiltration of the semimembranosus muscle is again noted. Moderate atherosclerotic vascular calcifications. IMPRESSION: 1. Persistent subcutaneous edema and fat stranding extending along the left proximal to mid medial thigh with overlying skin thickening. Increased subcutaneous edema and fat stranding along the left hip and thigh with overlying skin thickening. These findings are compatible with cellulitis. No loculated fluid collection. 2. Increased intramuscular edema in the left adductor compartment musculature with similar to slightly decreased stranding/edema extending along the left gracilis muscle, concerning for myositis. No discrete intramuscular collection identified. Electronically Signed   By: Mannie Seek M.D.   On: 11/29/2023 15:58   DG Chest Portable 1 View Result Date: 11/25/2023 CLINICAL DATA:  Shortness of breath, weakness,  nausea EXAM: PORTABLE CHEST 1 VIEW COMPARISON:  08/24/2023 FINDINGS: Power injectable right Port-A-Cath tip: SVC. Atherosclerotic calcification of the aortic arch. Upper zone pulmonary vascular prominence raises the possibility of pulmonary venous hypertension. Upper normal heart size. Suspected linear/bandlike subsegmental atelectasis or scarring at the left lung base. Deep sulcus on the left but without compelling findings of pneumothorax. IMPRESSION: 1. Upper zone pulmonary vascular prominence raises the possibility of pulmonary venous hypertension. 2. Suspected linear/bandlike subsegmental atelectasis or scarring at the left lung base. 3. Aortic Atherosclerosis (ICD10-I70.0). Electronically Signed   By: Freida Jes M.D.   On: 11/25/2023 11:17   CT FEMUR LEFT W CONTRAST Result Date: 11/25/2023 CLINICAL DATA:  Cellulitis. Tender. Evaluate for necrotizing fasciitis or abscess. Increased weakness and nausea after cancer treatment 3 days ago. Weeping wound to back of left thigh. History of multiple myeloma. EXAM: CT OF THE LOWER LEFT EXTREMITY WITH CONTRAST TECHNIQUE: Multidetector CT imaging of the lower left extremity was performed according to the standard protocol following intravenous contrast administration. RADIATION DOSE REDUCTION: This exam was performed according to the departmental dose-optimization program which includes automated exposure control, adjustment of the mA and/or kV according to patient size and/or use of iterative reconstruction technique. CONTRAST:  OMNIPAQUE  IOHEXOL  350 MG/ML SOLN COMPARISON:  Left knee radiographs 09/03/2022, pelvis and left hip radiographs 12/05/2017 FINDINGS: Bones/Joint/Cartilage No definite destructive lytic lesion is seen within the left femur in this  patient with history of multiple myeloma. Moderate left femoroacetabular joint space narrowing and moderate superolateral left acetabular degenerative osteophytosis. Severe pubic symphysis joint space  narrowing with subchondral sclerosis and mild superior osteophytosis. On coronal images there is partial visualization of the right knee with moderate to severe medial current joint space narrowing and subchondral sclerosis and mild to moderate peripheral osteophytosis. No acute fracture dislocation.  No cortical erosion. Ligaments Suboptimally assessed by CT. Muscles and Tendons Mild fatty infiltration of the semimembranosus muscle. Inflammatory fat stranding at the posteromedial border of the cm cyst muscle (axial series 7, images 106 through 166, with trace fluid just posteromedial to the muscle in this region. Similar moderate inflammatory stranding/edema within the fat posterior and medial to the gracilis muscle. No air is seen tracking along the fascial planes to indicate necrotizing fasciitis. Soft tissues Moderate medial and posteromedial and mild anteromedial proximal to mid left thigh subcutaneous fat edema and swelling. Moderate thickening of the medial and posteromedial proximal to mid left thigh skin. No walled-off fluid collection is seen to indicate an abscess. Mild focal thickening/irregularity of the skin at the posterior aspect of the proximal to mid left thigh (axial series 7, image 111 and sagittal series 11, image 117). This may represent the region of the reported posterior thigh weeping wound. Moderate to high-grade atherosclerotic calcifications. IMPRESSION: 1. Moderate medial and posteromedial and mild anteromedial proximal to mid left thigh subcutaneous fat edema and swelling. Moderate thickening of the regional left thigh skin. No walled-off fluid collection is seen to indicate an abscess. 2. Mild focal thickening/irregularity of the skin at the posterior aspect of the proximal to mid left thigh. This may represent the region of the reported posterior thigh weeping wound. 3. No high-grade fascial thickening, subcutaneous air, or perifascial air is seen to suggest CT evidence of necrotizing  fasciitis. 4. No definite destructive lytic lesion is seen within the left femur in this patient with history of multiple myeloma. 5. 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 Electronically Signed   By: Bertina Broccoli M.D.   On: 11/25/2023 11:08   CT ABDOMEN PELVIS W CONTRAST Result Date: 11/25/2023 CLINICAL DATA:  Increased weakness with nausea after cancer treatment. Worsening abdominal pain. Personal history of multiple myeloma. EXAM: CT ANGIOGRAPHY CHEST CT ABDOMEN AND PELVIS WITH CONTRAST TECHNIQUE: Multidetector CT imaging of the chest was performed using the standard protocol during bolus administration of intravenous contrast. Multiplanar CT image reconstructions and MIPs were obtained to evaluate the vascular anatomy. Multidetector CT imaging of the abdomen and pelvis was performed using the standard protocol during bolus administration of intravenous contrast. RADIATION DOSE REDUCTION: This exam was performed according to the departmental dose-optimization program which includes automated exposure control, adjustment of the mA and/or kV according to patient size and/or use of iterative reconstruction technique. CONTRAST:  OMNIPAQUE  IOHEXOL  350 MG/ML SOLN COMPARISON:  PET-CT 09/30/2022. FINDINGS: CTA CHEST FINDINGS Cardiovascular: The heart is enlarged. Coronary artery calcification is evident. Moderate atherosclerotic calcification is noted in the wall of the thoracic aorta. No thoracic aortic aneurysm or dissection. There is no filling defect within the opacified pulmonary arteries to suggest the presence of an acute pulmonary embolus. Right Port-A-Cath tip is positioned in the distal SVC. Mediastinum/Nodes: No mediastinal lymphadenopathy. There is no hilar lymphadenopathy. The esophagus has normal imaging features. There is no axillary lymphadenopathy. Lungs/Pleura: Dependent atelectasis noted in the lung bases, right greater than left. 2 mm  left upper lobe nodule identified on image 35/8.  2 mm left lower lobe nodule identified on 71/8. No pulmonary edema or pleural effusion. Musculoskeletal: No overtly suspicious lytic or sclerotic osseous abnormality in this patient with a reported history of multiple myeloma. Review of the MIP images confirms the above findings. CT ABDOMEN and PELVIS FINDINGS Hepatobiliary: No suspicious focal abnormality within the liver parenchyma. Scattered tiny hypodensities in the liver parenchyma are too small to characterize but are statistically most likely benign. No followup imaging is recommended. There is no evidence for gallstones, gallbladder wall thickening, or pericholecystic fluid. No intrahepatic or extrahepatic biliary dilation. Pancreas: No focal mass lesion. No dilatation of the main duct. No intraparenchymal cyst. No peripancreatic edema. Spleen: No splenomegaly. No suspicious focal mass lesion. Adrenals/Urinary Tract: No adrenal nodule or mass. Kidneys unremarkable. No evidence for hydroureter. The urinary bladder appears normal for the degree of distention. Stomach/Bowel: Stomach is unremarkable. No gastric wall thickening. No evidence of outlet obstruction. Duodenum is normally positioned as is the ligament of Treitz. No small bowel wall thickening. No small bowel dilatation. The terminal ileum is normal. The appendix is normal. No gross colonic mass. No colonic wall thickening. Vascular/Lymphatic: There is advanced atherosclerotic calcification of the abdominal aorta without aneurysm. There is no gastrohepatic or hepatoduodenal ligament lymphadenopathy. No retroperitoneal or mesenteric lymphadenopathy. No pelvic sidewall lymphadenopathy. Reproductive: Calcified uterine fibroids evident. Endometrial stripe appears thickened at 13 mm on sagittal imaging. There is no adnexal mass. Other: No intraperitoneal free fluid. Musculoskeletal: No worrisome lytic or sclerotic osseous abnormality. Degenerative changes  noted lumbar spine. Review of the MIP images confirms the above findings. IMPRESSION: 1. No CT evidence for acute pulmonary embolus. 2. No acute findings in the abdomen or pelvis. Specifically, no findings to explain the patient's history of abdominal pain. 3. Endometrial stripe appears thickened at 13 mm on sagittal imaging. Correlation for abnormal vaginal bleeding recommended. Follow-up pelvic ultrasound recommended to further evaluate as endometrial neoplasm could have this appearance. 4. Calcified uterine fibroids. 5. Two 2 mm left lung nodules. No follow-up needed if patient is low-risk (and has no known or suspected primary neoplasm). Non-contrast chest CT can be considered in 12 months if patient is high-risk. This recommendation follows the consensus statement: Guidelines for Management of Incidental Pulmonary Nodules Detected on CT Images: From the Fleischner Society 2017; Radiology 2017; 284:228-243. 6.  Aortic Atherosclerosis (ICD10-I70.0). Electronically Signed   By: Donnal Fusi M.D.   On: 11/25/2023 10:04   CT Angio Chest PE W and/or Wo Contrast Result Date: 11/25/2023 CLINICAL DATA:  Increased weakness with nausea after cancer treatment. Worsening abdominal pain. Personal history of multiple myeloma. EXAM: CT ANGIOGRAPHY CHEST CT ABDOMEN AND PELVIS WITH CONTRAST TECHNIQUE: Multidetector CT imaging of the chest was performed using the standard protocol during bolus administration of intravenous contrast. Multiplanar CT image reconstructions and MIPs were obtained to evaluate the vascular anatomy. Multidetector CT imaging of the abdomen and pelvis was performed using the standard protocol during bolus administration of intravenous contrast. RADIATION DOSE REDUCTION: This exam was performed according to the departmental dose-optimization program which includes automated exposure control, adjustment of the mA and/or kV according to patient size and/or use of iterative reconstruction technique.  CONTRAST:  OMNIPAQUE  IOHEXOL  350 MG/ML SOLN COMPARISON:  PET-CT 09/30/2022. FINDINGS: CTA CHEST FINDINGS Cardiovascular: The heart is enlarged. Coronary artery calcification is evident. Moderate atherosclerotic calcification is noted in the wall of the thoracic aorta. No thoracic aortic aneurysm or dissection. There is no filling defect within the opacified pulmonary arteries to suggest the  presence of an acute pulmonary embolus. Right Port-A-Cath tip is positioned in the distal SVC. Mediastinum/Nodes: No mediastinal lymphadenopathy. There is no hilar lymphadenopathy. The esophagus has normal imaging features. There is no axillary lymphadenopathy. Lungs/Pleura: Dependent atelectasis noted in the lung bases, right greater than left. 2 mm left upper lobe nodule identified on image 35/8. 2 mm left lower lobe nodule identified on 71/8. No pulmonary edema or pleural effusion. Musculoskeletal: No overtly suspicious lytic or sclerotic osseous abnormality in this patient with a reported history of multiple myeloma. Review of the MIP images confirms the above findings. CT ABDOMEN and PELVIS FINDINGS Hepatobiliary: No suspicious focal abnormality within the liver parenchyma. Scattered tiny hypodensities in the liver parenchyma are too small to characterize but are statistically most likely benign. No followup imaging is recommended. There is no evidence for gallstones, gallbladder wall thickening, or pericholecystic fluid. No intrahepatic or extrahepatic biliary dilation. Pancreas: No focal mass lesion. No dilatation of the main duct. No intraparenchymal cyst. No peripancreatic edema. Spleen: No splenomegaly. No suspicious focal mass lesion. Adrenals/Urinary Tract: No adrenal nodule or mass. Kidneys unremarkable. No evidence for hydroureter. The urinary bladder appears normal for the degree of distention. Stomach/Bowel: Stomach is unremarkable. No gastric wall thickening. No evidence of outlet obstruction. Duodenum is  normally positioned as is the ligament of Treitz. No small bowel wall thickening. No small bowel dilatation. The terminal ileum is normal. The appendix is normal. No gross colonic mass. No colonic wall thickening. Vascular/Lymphatic: There is advanced atherosclerotic calcification of the abdominal aorta without aneurysm. There is no gastrohepatic or hepatoduodenal ligament lymphadenopathy. No retroperitoneal or mesenteric lymphadenopathy. No pelvic sidewall lymphadenopathy. Reproductive: Calcified uterine fibroids evident. Endometrial stripe appears thickened at 13 mm on sagittal imaging. There is no adnexal mass. Other: No intraperitoneal free fluid. Musculoskeletal: No worrisome lytic or sclerotic osseous abnormality. Degenerative changes noted lumbar spine. Review of the MIP images confirms the above findings. IMPRESSION: 1. No CT evidence for acute pulmonary embolus. 2. No acute findings in the abdomen or pelvis. Specifically, no findings to explain the patient's history of abdominal pain. 3. Endometrial stripe appears thickened at 13 mm on sagittal imaging. Correlation for abnormal vaginal bleeding recommended. Follow-up pelvic ultrasound recommended to further evaluate as endometrial neoplasm could have this appearance. 4. Calcified uterine fibroids. 5. Two 2 mm left lung nodules. No follow-up needed if patient is low-risk (and has no known or suspected primary neoplasm). Non-contrast chest CT can be considered in 12 months if patient is high-risk. This recommendation follows the consensus statement: Guidelines for Management of Incidental Pulmonary Nodules Detected on CT Images: From the Fleischner Society 2017; Radiology 2017; 284:228-243. 6.  Aortic Atherosclerosis (ICD10-I70.0). Electronically Signed   By: Donnal Fusi M.D.   On: 11/25/2023 10:04   CT HEAD WO CONTRAST ( ) Result Date: 11/25/2023 CLINICAL DATA:  73 year old female with headache and fever. Recent cancer treatment. Multiple myeloma.  EXAM: CT HEAD WITHOUT CONTRAST TECHNIQUE: Contiguous axial images were obtained from the base of the skull through the vertex without intravenous contrast. RADIATION DOSE REDUCTION: This exam was performed according to the departmental dose-optimization program which includes automated exposure control, adjustment of the mA and/or kV according to patient size and/or use of iterative reconstruction technique. COMPARISON:  Head CT 05/26/2021. FINDINGS: Brain: Cerebral volume is stable, normal for age. No midline shift, ventriculomegaly, mass effect, evidence of mass lesion, intracranial hemorrhage or evidence of cortically based acute infarction. Stable bilateral basal ganglia vascular calcifications. Confluent and extensive bilateral cerebral white matter  hypodensity is chronic but progressed since 2022, fairly symmetric. No suspicion of vasogenic edema at this time. Vascular: No suspicious intracranial vascular hyperdensity. Calcified atherosclerosis at the skull base. Skull: Stable. No acute or suspicious osseous abnormality identified. Sinuses/Orbits: Subtotal opacification of the left tympanic cavity and mastoids, new since 2022. Other Visualized paranasal sinuses and mastoids are stable and well aerated. Other: No acute orbit or scalp soft tissue finding. Negative visible nasopharynx. IMPRESSION: 1. Left middle ear and mastoid opacification is new since 2022. Consider with Acute Otitis Media with mastoid effusion. 2. 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 Electronically Signed   By: Marlise Simpers M.D.   On: 11/25/2023 09:30    Time coordinating discharge: 50 mins  SIGNED:  Unk Garb, DO [facility] 12/05/23, 4:26 PM

## 2023-12-05 NOTE — TOC Progression Note (Addendum)
 Transition of Care [facility]) - Progression Note    Patient Details  Name: Brandi [name] MRN: [id number] Date of Birth: 04/29/1951  [facility] of [facility]) CM/SW Contact  Baird Bombard, RN Phone Number: 12/05/2023, 10:26 AM  Clinical Narrative:    Message retrieved from Pam at [place].  Returned call to [employer] No answer. Left a message.   Spoke with Verdis Glade at [facility] regarding HH. Patient can still be seen by [facility] at discharge.   12:02pm Spoke with Pam from [employer]. She will be here at [facility] to teach infusion to patient's daughter. Pam stated she has spoken with patient to advised of time teaching will occur. Nurse made aware patient's daughter has to be taught prior to discharge.          Expected Discharge Plan and Services                                               Social Determinants of Health (SDOH) Interventions SDOH Screenings   Food Insecurity: No Food Insecurity (09/04/2022)  Housing: Low Risk  (09/04/2022)  Transportation Needs: No Transportation Needs (09/04/2022)  Utilities: Not At Risk (09/04/2022)  Depression (PHQ2-9): Low Risk  (10/14/2022)  Tobacco Use: Medium Risk (12/01/2023)    Readmission Risk Interventions    05/27/2021   10:42 AM  Readmission Risk Prevention Plan  Transportation Screening Complete  Medication Review (RN Care Manager) Complete  PCP or Specialist appointment within 3-5 days of discharge Complete  HRI or Home Care Consult Complete  SW Recovery Care/Counseling Consult Complete  Palliative Care Screening Complete  Skilled Nursing Facility Not Applicable

## 2023-12-05 NOTE — Progress Notes (Signed)
 PT Cancellation Note  Patient Details Name: Brandi [name] MRN: [id number] DOB: 05/05/1951   Cancelled Treatment:    Reason Eval/Treat Not Completed: Other (comment).  Attempted to see pt 2x's this morning but pt declined both attempts (1st time pt requesting to eat her breakfast; 2nd time pt stating she needed to call her daughter to discuss discharge plans).  Amador Junes, PT 12/05/23, 4:41 PM

## 2023-12-05 NOTE — Discharge Instructions (Signed)
 Follow up with PCP and pick up medications from pharmacy. Make sure to take all antibiotics. Do not double up on narcotic/pain medication or drink alcohol during this time. Do not drive while taking narcotic medication. Call 911 or return to ER for life threatening issues or other concerns.

## 2023-12-06 DIAGNOSIS — A419 Sepsis, unspecified organism: Secondary | ICD-10-CM | POA: Diagnosis not present

## 2023-12-06 DIAGNOSIS — I5032 Chronic diastolic (congestive) heart failure: Secondary | ICD-10-CM | POA: Diagnosis not present

## 2023-12-06 DIAGNOSIS — J449 Chronic obstructive pulmonary disease, unspecified: Secondary | ICD-10-CM | POA: Diagnosis not present

## 2023-12-06 DIAGNOSIS — A4902 Methicillin resistant Staphylococcus aureus infection, unspecified site: Secondary | ICD-10-CM

## 2023-12-06 DIAGNOSIS — L03116 Cellulitis of left lower limb: Secondary | ICD-10-CM | POA: Diagnosis not present

## 2023-12-06 HISTORY — DX: Methicillin resistant Staphylococcus aureus infection, unspecified site: A49.02

## 2023-12-06 NOTE — Progress Notes (Signed)
 Per order pt's chest port is being left accessed for home health/daughter to administer IV abx. Pt's wound vac and supplies are also present and being sent home. D/c meds and flushes at bedside as well. Pt's family notified of need to come pick patient up. She states she will be here in  about 30 mins.

## 2023-12-06 NOTE — Plan of Care (Signed)

## 2023-12-06 NOTE — TOC Transition Note (Signed)
 Transition of Care [facility]) - Discharge Note   Patient Details  Name: Brandi [name] MRN: [id number] Date of Birth: 11-26-1950  Transition of Care [facility] CM/SW Contact:  Dequane Strahan C Jayah Balthazar, RN Phone Number: 12/06/2023, 1:16 PM   Clinical Narrative:    Spoke with patient's daughter Beauford Bounds. She inquired increasing patient's CAP hours. She was advised to contact DSS and patient's PCP for medicaid hours. Call disconnected.   Per primary nurse wound vac has been delivered and  Beauford Bounds is on the way to transport patient home. Verdis Glade from [place] and Pam from [place] notified.   TOC signing off.           Patient Goals and CMS Choice            Discharge Placement                       Discharge Plan and Services Additional resources added to the After Visit Summary for                                       Social Drivers of Health (SDOH) Interventions SDOH Screenings   Food Insecurity: No Food Insecurity (09/04/2022)  Housing: Low Risk  (09/04/2022)  Transportation Needs: No Transportation Needs (09/04/2022)  Utilities: Not At Risk (09/04/2022)  Depression (PHQ2-9): Low Risk  (10/14/2022)  Tobacco Use: Medium Risk (12/01/2023)     Readmission Risk Interventions    05/27/2021   10:42 AM  Readmission Risk Prevention Plan  Transportation Screening Complete  Medication Review (RN Care Manager) Complete  PCP or Specialist appointment within 3-5 days of discharge Complete  HRI or Home Care Consult Complete  SW Recovery Care/Counseling Consult Complete  Palliative Care Screening Complete  Skilled Nursing Facility Not Applicable

## 2023-12-06 NOTE — Discharge Summary (Signed)
 [facility] Physician Discharge Summary   Patient name: Brandi [name]  Admit date:     11/25/2023  Discharge date: 12/06/2023  Attending Physician: Frank [place] [[id number]]  Discharge Physician: Unk Garb   PCP: [name], MD  Admitted From: Home   Disposition:  Home  Recommendations for Outpatient Follow-up:  Follow up with PCP in 1-2 weeks Ambulatory referral made to wound care clinic Please follow up on the following pending results:  Home Health:Yes PT/OT/RN/nurse aide Equipment/Devices: None  Discharge Condition:Stable CODE STATUS:FULL Diet recommendation: Heart Healthy Fluid Restriction: None  Hospital Summary: HPI: Brandi [name] is a 73 y.o. female with medical history significant of MM on chemotherapy, chronic HFpEF, HTN, hypothyroidism, presented with GI symptoms including nauseous vomiting abdominal pain.  She also complains about worsening of left leg swelling pain and drainage.   Patient has history of multiple myeloma since 2018 and has been on chemotherapy, last chemo was this past Tuesday.  Started on Wednesday, patient started to feel nauseous and cramping-like lower abdominal pain, with multiple vomiting on Wednesday but no more vomiting since Thursday.  Location of abdominal pain is rather poorly defined.  No exacerbating factors.  2 days ago she also started to notice " a boil" in the left inner thigh, gradually getting bigger and ruptured yesterday with thick yellowish pus coming out.  She has had some chills but no fever.  No chest pain shortness of breath no diarrhea denies any urinary symptoms.  Denied any active bleeding.  Significant Events: Admitted 11/25/2023 cellulitis 12-01-2023 operative I&D left thigh abscess 12-04-2023 operative cultures growing MRSA, morganella, E. Coli  Significant Labs: WBC 3.5, HgB 11.6, plt 63 Na 136, K 2.4, CO2 of 18, BUN 19, Scr 0.91. glu 90 Ca 7.7, TP 6.6, alb 3.8, AST 18, ALT 10, alk phos 64 Mg  1.5  Significant Imaging Studies: CT abd/pelvis, CTPA No CT evidence for acute pulmonary embolus. 2. No acute findings in the abdomen or pelvis. Specifically, no findings to explain the patient's history of abdominal pain. 3. Endometrial stripe appears thickened at 13 mm on sagittal imaging. Correlation for abnormal vaginal bleeding recommended. Follow-up pelvic ultrasound recommended to further evaluate as endometrial neoplasm could have this appearance. 4. Calcified uterine fibroids. 5. Two 2 mm left lung nodules.  CT left femur Moderate medial and posteromedial and mild anteromedial proximal to mid left thigh subcutaneous fat edema and swelling. Moderate thickening of the regional left thigh skin. No walled-off fluid collection is seen to indicate an abscess. 2. Mild focal thickening/irregularity of the skin at the posterior aspect of the proximal to mid left thigh. This may represent the region of the reported posterior thigh weeping wound. 3. No high-grade fascial thickening, subcutaneous air, or perifascial air is seen to suggest CT evidence of necrotizing fasciitis. 4. No definite destructive lytic lesion is seen within the left femur in this patient with history of multiple myeloma. 5. 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 CT head Left middle ear and mastoid opacification is new since 2022. Consider with Acute Otitis Media with mastoid effusion. 2. 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 CXR Upper zone pulmonary vascular prominence raises the possibility of pulmonary venous hypertension. 2. Suspected linear/bandlike subsegmental atelectasis or scarring at the left lung base. 3. Aortic Atherosclerosis 11-29-2023 CT left femur Persistent subcutaneous edema and fat stranding extending along the left proximal to mid medial  thigh with overlying skin  thickening. Increased subcutaneous edema and fat stranding along the left hip and thigh with overlying skin thickening. These findings are compatible with cellulitis. No loculated fluid collection. 2. Increased intramuscular edema in the left adductor  compartment musculature with similar to slightly decreased stranding/edema extending along the left gracilis muscle, concerning for myositis. No discrete intramuscular collection identified  Antibiotic Therapy: Anti-infectives (From admission, onward)    Start     Dose/Rate Route Frequency Ordered Stop   11/27/23 0900  cefTRIAXone  (ROCEPHIN ) 2 g in sodium chloride  0.9 % 100 mL IVPB        2 g 200 mL/hr over 30 Minutes Intravenous Every 24 hours 11/27/23 0747 12/02/23 0859   11/26/23 1445  azithromycin  (ZITHROMAX ) tablet 500 mg        500 mg Oral Daily 11/26/23 1444 12/01/23 0959   11/26/23 1000  vancomycin  (VANCOCIN ) IVPB 1000 mg/200 mL premix        1,000 mg 200 mL/hr over 60 Minutes Intravenous Every 24 hours 11/25/23 1303 11/29/23 1232   11/25/23 1700  ceFAZolin  (ANCEF ) IVPB 1 g/50 mL premix  Status:  Discontinued        1 g 100 mL/hr over 30 Minutes Intravenous Every 8 hours 11/25/23 1307 11/27/23 0747   11/25/23 1330  vancomycin  (VANCOREADY) IVPB 500 mg/100 mL        500 mg 100 mL/hr over 60 Minutes Intravenous  Once 11/25/23 1247 11/25/23 1523   11/25/23 0845  piperacillin -tazobactam (ZOSYN ) IVPB 3.375 g        3.375 g 100 mL/hr over 30 Minutes Intravenous  Once 11/25/23 0830 11/25/23 0934   11/25/23 0830  cefTRIAXone  (ROCEPHIN ) 2 g in sodium chloride  0.9 % 100 mL IVPB  Status:  Discontinued        2 g 200 mL/hr over 30 Minutes Intravenous Once 11/25/23 0817 11/25/23 0830   11/25/23 0830  vancomycin  (VANCOCIN ) IVPB 1000 mg/200 mL premix        1,000 mg 200 mL/hr over 60 Minutes Intravenous  Once 11/25/23 0817 11/25/23 1059       Procedures: 12-01-2023 I&D left medial thigh abscess  Consultants: ID General  Surgery   Hospital Course by Problem: * Sepsis (HCC) On admission through 11-29-2023 Hypotension, tachycardia, elevated lactate 2.2 on presentation with known abscess given concern for severe sepsis.  Requiring multiple IV fluid boluses to maintain MAP greater than 65.  No vasopressor medications required at this time.  Blood pressure appears to be improving.  Continue to monitor closely.  Blood cultures drawn.  Broad-spectrum antibiotic coverage on board.  Appears to be resolving.  No longer requiring midodrine .   11-30-2023 resolved.   Cellulitis and abscess of left lower extremity On admission through 11-29-2023 CT scan on presentation noting mostly medial left thigh subcutaneous edema, but no walled off fluid collection to indicate abscess.  Likely etiology of patient's sepsis.  Broad-spectrum antibiotic coverage on board initially cefazolin  and vancomycin .  Transitioning to ceftriaxone  2 g every 24 hours secondary to pneumonia coverage on 5/4.  Small weeping area with induration, daily dressing change.  Will order wound care consult.  Given continued induration induration and tenderness, will repeat CT of the area to assess for developed underlying abscess.  If abscess is present, will likely need to get general surgery involved for I&D.    11-30-2023 repeat CT femur yesterday still shows no abscess. Pt has been on IV rocephin /vanco for 5 days now. While her labs are improved, pt  still with clinically significant cellulitis. Will ask ID consult to see patient to see if there are any abx changes that need to be made.  I don't see good anaerobic coverage right now but for a medial thigh cellulitis, I'm not sure what organisms to target.  12-01-2023 s/p I&D in OR today. 8 x 8 x 2 cm abscess/necrotic tissue was drained/debrided in OR. Cultures taken. IV dilaudid  prn ordered. Remains on IV zosyn  and po zyvox .   12-02-2023 continue with zyvox  and IV zosyn .  Left thigh still numb from Exparel /lidocaine   that was injected into wound edges. Afebrile. Intra-op wound cx growing staph aureus and GNR.  General surgery approved starting chemical DVT prophylaxis today.  Awaiting ID recs. Continue prn IV dilaudid .  12-03-2023 remains on IV zosyn /po zyvox . Awaiting final cultures. Will need home health RN for wound care. And ambulatory referral to wound care clinic.  12-04-2023 Operative cultures growing MRSA, Morganella and E. Coli.  On IV zosyn /PO Zyvox  which covers all organisms. Pt has port-a-cath. So conceivably, she could get IV Zosyn  at home if ID thinks this is appropriate. Will await ID to return on Monday to decide on home abx regimen.  12-05-2023 ID thinks pt can go home on IV Invanz and po zyvox . Will await final OPAT orders.  *update OPAT Orders Discharge antibiotics: Ertapenem 1 gram IV every 24hrs  AND PO linezolid   Duration: 7 days End Date: 12/12/23   Pt has PORT- port care   Labs weekly while on IV antibiotics: _X_ CBC with differential   _X_ CMP  *update. General surgery wants pt go to home on wound vac and continue this as outpatient.   12-06-2023 wound vac placed yesterday. Ordered by surgery. Ready for DC today as soon as home wound vac machine is delivered.  COPD (chronic obstructive pulmonary disease) (HCC) 11-30-2023 not convinced she has pneumonia. Pt does have hx of COPD. On IV rocephin . Not wheezing or hypoxic.  12-01-2023 stable   12-02-2023 stable  12-03-2023 stable  12-04-2023 stable  12-05-2023 stable  12-06-2023 stable.  Chronic heart failure with preserved ejection fraction (HFpEF) (HCC) 11-30-2023 chronic. Not hypoxic. Lying flat in bed.  12-01-2023 stable  12-02-2023 stable  12-03-2023 stable  12-04-2023 stable  12-05-2023 stable  12-06-2023 stable.  Essential hypertension 11-30-2023 stable. Pt with some swelling in left leg. Still restart low dose lasix  along with kcl supplementation.  12-01-2023 hold lasix  as her left leg  swelling likely due to abscess/cellulitis.  12-02-2023 stable  12-03-2023 stable  12-04-2023 stable  12-06-2023 stable.  12-05-2023 stable  Hypokalemia-resolved as of 11/30/2023 Treated and resolved.  Multiple myeloma in relapse (HCC) 12-01-2023 immunosuppressed due to MM.  12-02-2023 stable  12-03-2023 stable  12-04-2023 stable  12-05-2023 stable  12-06-2023 stable. Will need to delay any chemo until after her IV ABX are completed.  She asked that I notify her oncologist about her admission. I have sent secure chat to Dr. Valentine Gasmen.     Discharge Diagnoses:  Principal Problem:   Sepsis (HCC) Active Problems:   Cellulitis and abscess of left lower extremity   Essential hypertension   Chronic heart failure with preserved ejection fraction (HFpEF) (HCC)   COPD (chronic obstructive pulmonary disease) (HCC)   Necrotizing cellulitis   Abscess   Multiple myeloma in relapse [facility])   Discharge Instructions  Discharge Instructions     [facility] pharmacist to adjust dose for Vancomycin , Aminoglycosides and other anti-infective therapies as requested by physician.   Complete by: As  directed    [facility] to provide Cath Flo 2mg    Complete by: As directed    Administer for PICC line occlusion and as ordered by physician for other access device issues.   Ambulatory referral to Wound Clinic   Complete by: As directed    Left thigh wound, s/p I&D of abscess   Anaphylaxis Kit: Provided to treat any anaphylactic reaction to the medication being provided to the patient if First Dose or when requested by physician   Complete by: As directed    Epinephrine  1mg /ml vial / amp: Administer 0.3mg  (0.18ml) subcutaneously once for moderate to severe anaphylaxis, nurse to call physician and pharmacy when reaction occurs and call 911 if needed for immediate care   Diphenhydramine  50mg /ml IV vial: Administer 25-50mg  IV/IM PRN for first dose reaction, rash, itching,  mild reaction, nurse to call physician and pharmacy when reaction occurs   Sodium Chloride  0.9% NS 500ml IV: Administer if needed for hypovolemic blood pressure drop or as ordered by physician after call to physician with anaphylactic reaction   Call MD for:  difficulty breathing, headache or visual disturbances   Complete by: As directed    Call MD for:  extreme fatigue   Complete by: As directed    Call MD for:  hives   Complete by: As directed    Call MD for:  persistant dizziness or light-headedness   Complete by: As directed    Call MD for:  persistant nausea and vomiting   Complete by: As directed    Call MD for:  redness, tenderness, or signs of infection (pain, swelling, redness, odor or green/yellow discharge around incision site)   Complete by: As directed    Call MD for:  severe uncontrolled pain   Complete by: As directed    Call MD for:  temperature >100.4   Complete by: As directed    Change dressing on IV access line weekly and PRN   Complete by: As directed    Diet - low sodium heart healthy   Complete by: As directed    Discharge instructions   Complete by: As directed    1. Follow up with your primary care provider in 1-2 weeks following discharge from hospital. 2. Ambulatory referral made to wound care clinic.   Discharge wound care:   Complete by: As directed    Wound: left medial thigh Wound care  Daily  Comments: Dressing change: every day using Kling roll, wet with normal saline solution (NSS). Remove old,  Replace packing damp to wet with NSS, fill the wound to skin edges, and cover with ABD-like absorbant pad.  Secure with limited tape, and underwear.   Flush IV access with Sodium Chloride  0.9% and Heparin  10 units/ml or 100 units/ml   Complete by: As directed    Home infusion instructions - [facility]   Complete by: As directed    Instructions: Flush IV access with Sodium Chloride  0.9% and Heparin  10units/ml or 100units/ml   Change dressing on  IV access line: Weekly and PRN   Instructions Cath Flo 2mg : Administer for PICC Line occlusion and as ordered by physician for other access device   [facility] pharmacist to adjust dose for: Vancomycin , Aminoglycosides and other anti-infective therapies as requested by physician   Increase activity slowly   Complete by: As directed    Method of administration may be changed at the discretion of home infusion pharmacist based upon assessment of the patient and/or caregiver's  ability to self-administer the medication ordered   Complete by: As directed    Outpatient Parenteral Antibiotic Therapy Information Antibiotic: Ertapenem (Invanz) IVPB; Indications for use: SSTI w/ ESBL Organism; End Date: 12/12/2023   Complete by: As directed    Antibiotic: Ertapenem (Invanz) IVPB   Indications for use: SSTI w/ ESBL Organism   End Date: 12/12/2023      Allergies as of 12/06/2023   No Known Allergies      Medication List     STOP taking these medications    potassium chloride  SA 20 MEQ tablet Commonly known as: KLOR-CON  M       TAKE these medications    albuterol  108 (90 Base) MCG/ACT inhaler Commonly known as: VENTOLIN  HFA Inhale 2 puffs into the lungs every 6 (six) hours as needed for wheezing or shortness of breath.   aspirin  EC 81 MG tablet Take 1 tablet (81 mg total) by mouth daily.   calcium  carbonate 600 MG Tabs tablet Commonly known as: Calcium  600 High Potency Take 2 tablets (1,200 mg total) by mouth 2 (two) times daily.   ertapenem 1 g in sodium chloride  0.9 % 100 mL Inject 1 g into the vein daily.   ertapenem IVPB Commonly known as: INVANZ Inject 1 g into the vein daily for 7 days. Indication:  ESBL SSTI First Dose: Yes Last Day of Therapy:  12/12/23 Labs - Once weekly:  CBC/D and CMP Method of administration: Mini-Bag Plus / Gravity Method of administration may be changed at the discretion of home infusion pharmacist based upon assessment of the patient  and/or caregiver's ability to self-administer the medication ordered. Fax weekly lab results  promptly to [phone]   furosemide  40 MG tablet Commonly known as: Lasix  Take 1 tablet (40 mg total) by mouth daily as needed for edema or fluid.   levothyroxine  175 MCG tablet Commonly known as: Synthroid  Take 1 tablet (175 mcg total) by mouth daily. For thyroid    lidocaine -prilocaine  cream Commonly known as: EMLA  Apply 1 application topically as needed. Apply to port and cover with saran wrap 1-2 hours prior to port access   linezolid  600 MG tablet Commonly known as: ZYVOX  Take 1 tablet (600 mg total) by mouth 2 (two) times daily for 7 days. Take through 12/12/2023.   magnesium  oxide 400 (240 Mg) MG tablet Commonly known as: MAG-OX Take 1 tablet (400 mg total) by mouth 2 (two) times daily.   montelukast  10 MG tablet Commonly known as: SINGULAIR  Take 1 tablet (10 mg total) by mouth at bedtime.   ondansetron  8 MG tablet Commonly known as: ZOFRAN  Take 1 tablet (8 mg total) by mouth every 8 (eight) hours as needed for nausea or vomiting. What changed:  how much to take how to take this when to take this reasons to take this additional instructions   Oxycodone  HCl 10 MG Tabs Take 1 tablet (10 mg total) by mouth daily as needed (prior to dressing changes).   pantoprazole  40 MG tablet Commonly known as: PROTONIX  Take 1 tablet (40 mg total) by mouth daily.   polyethylene glycol 17 g packet Commonly known as: MIRALAX  / GLYCOLAX  Take 17 g by mouth daily as needed for mild constipation.   Simbrinza 1-0.2 % Susp Generic drug: Brinzolamide -Brimonidine  Apply 1 drop to eye in the morning and at bedtime. I drop both eyes twice a day   sodium chloride  flush 0.9 % Soln injection 10-40 mLs by Intracatheter route as needed (flush).   spironolactone  25 MG  tablet Commonly known as: ALDACTONE  Take 1 tablet (25 mg total) by mouth daily as needed (edema). What changed:  when to take  this reasons to take this   traMADol  50 MG tablet Commonly known as: ULTRAM  Take 1 tablet (50 mg total) by mouth every 12 (twelve) hours as needed. What changed: reasons to take this   Vitamin D  (Ergocalciferol ) 1.25 MG (50000 UNIT) Caps capsule Commonly known as: DRISDOL  Take 1 capsule by mouth once a week               Discharge Care Instructions  (From admission, onward)           Start     Ordered   12/05/23 0000  Change dressing on IV access line weekly and PRN  (Home infusion instructions - [facility] )        12/05/23 1018   12/05/23 0000  Discharge wound care:       Comments: Wound: left medial thigh Wound care  Daily  Comments: Dressing change: every day using Kling roll, wet with normal saline solution (NSS). Remove old,  Replace packing damp to wet with NSS, fill the wound to skin edges, and cover with ABD-like absorbant pad.  Secure with limited tape, and underwear.   12/05/23 1021            No Known Allergies  Discharge Exam: Vitals:   12/06/23 0853 12/06/23 1122  BP: 131/71 127/79  Pulse: 67 69  Resp:    Temp: 98.8 F (37.1 C) 99 F (37.2 C)  SpO2: 98% 97%    Physical Exam Vitals and nursing note reviewed.  Constitutional:      General: She is not in acute distress.    Appearance: She is normal weight. She is not toxic-appearing or diaphoretic.  HENT:     Head: Normocephalic and atraumatic.     Nose: Nose normal.  Cardiovascular:     Rate and Rhythm: Normal rate and regular rhythm.  Pulmonary:     Effort: Pulmonary effort is normal.     Breath sounds: Normal breath sounds.  Abdominal:     General: Bowel sounds are normal.     Palpations: Abdomen is soft.  Skin:    General: Skin is warm and dry.     Capillary Refill: Capillary refill takes less than 2 seconds.     Comments: Wound vac dressing intact left medial thigh.  Neurological:     Mental Status: She is alert and oriented to person, place, and time.      The results of significant diagnostics from this hospitalization (including imaging, microbiology, ancillary and laboratory) are listed below for reference.    Microbiology: Recent Results (from the past 240 hours)  Expectorated Sputum Assessment w Gram Stain, Rflx to Resp Cult     Status: None   Collection Time: 11/27/23 12:15 PM   Specimen: Sputum  Result Value Ref Range Status   Specimen Description SPUTUM  Final   Special Requests NONE  Final   Sputum evaluation   Final    THIS SPECIMEN IS ACCEPTABLE FOR SPUTUM CULTURE Performed at [facility], [address], [place], [place] [address]    Report Status 11/27/2023 FINAL  Final  Culture, Respiratory w Gram Stain     Status: None   Collection Time: 11/27/23 12:15 PM   Specimen: SPU  Result Value Ref Range Status   Specimen Description   Final    SPUTUM Performed at [facility],  [address], [place], [place] [address]    Special Requests   Final    NONE Reflexed from [id number] Performed at [facility], [address] [place], [place] [address]    Gram Stain   Final    RARE WBC PRESENT, PREDOMINANTLY PMN FEW GRAM POSITIVE COCCI    Culture   Final    Normal respiratory flora-no Staph aureus or Pseudomonas seen Performed at [facility], [address], [place], [place] [address]    Report Status 11/29/2023 FINAL  Final  Aerobic Culture w Gram Stain (superficial specimen)     Status: None   Collection Time: 11/30/23  4:00 PM   Specimen: Wound  Result Value Ref Range Status   Specimen Description   Final    WOUND Performed at [facility], [address] [place], [place] [address]    Special Requests   Final     THIGH Performed at [facility], [address] [place], [place] [address]    Gram Stain   Final    NO WBC SEEN RARE GRAM NEGATIVE RODS Performed at [facility], [address], [place], [place] [address]    Culture   Final     ABUNDANT METHICILLIN RESISTANT STAPHYLOCOCCUS AUREUS ABUNDANT ESCHERICHIA COLI Confirmed Extended Spectrum Beta-Lactamase Producer (ESBL).  In bloodstream infections from ESBL organisms, carbapenems are preferred over piperacillin /tazobactam. They are shown to have a lower risk of mortality. MODERATE MORGANELLA MORGANII    Report Status 12/03/2023 FINAL  Final   Organism ID, Bacteria METHICILLIN RESISTANT STAPHYLOCOCCUS AUREUS  Final   Organism ID, Bacteria ESCHERICHIA COLI  Final   Organism ID, Bacteria MORGANELLA MORGANII  Final      Susceptibility   Escherichia coli - MIC*    AMPICILLIN >=32 RESISTANT Resistant     CEFEPIME  16 RESISTANT Resistant     CEFTAZIDIME RESISTANT Resistant     CEFTRIAXONE  >=64 RESISTANT Resistant     CIPROFLOXACIN >=4 RESISTANT Resistant     GENTAMICIN <=1 SENSITIVE Sensitive     IMIPENEM <=0.25 SENSITIVE Sensitive     TRIMETH/SULFA >=320 RESISTANT Resistant     AMPICILLIN/SULBACTAM 4 SENSITIVE Sensitive     PIP/TAZO <=4 SENSITIVE Sensitive ug/mL    * ABUNDANT ESCHERICHIA COLI   Morganella morganii - MIC*    AMPICILLIN >=32 RESISTANT Resistant     CEFTAZIDIME <=1 SENSITIVE Sensitive     CIPROFLOXACIN <=0.25 SENSITIVE Sensitive     GENTAMICIN <=1 SENSITIVE Sensitive     IMIPENEM 2 SENSITIVE Sensitive     TRIMETH/SULFA <=20 SENSITIVE Sensitive     AMPICILLIN/SULBACTAM >=32 RESISTANT Resistant     PIP/TAZO <=4 SENSITIVE Sensitive ug/mL    * MODERATE MORGANELLA MORGANII   Methicillin resistant staphylococcus aureus - MIC*    CIPROFLOXACIN >=8 RESISTANT Resistant     ERYTHROMYCIN >=8 RESISTANT Resistant     GENTAMICIN <=0.5 SENSITIVE Sensitive     OXACILLIN >=4 RESISTANT Resistant     TETRACYCLINE <=1 SENSITIVE Sensitive     VANCOMYCIN  1 SENSITIVE Sensitive     TRIMETH/SULFA >=320 RESISTANT Resistant     CLINDAMYCIN <=0.25 SENSITIVE Sensitive     RIFAMPIN <=0.5 SENSITIVE Sensitive     Inducible Clindamycin NEGATIVE Sensitive     LINEZOLID  2 SENSITIVE  Sensitive     * ABUNDANT METHICILLIN RESISTANT STAPHYLOCOCCUS AUREUS  Aerobic/Anaerobic Culture w Gram Stain (surgical/deep wound)     Status: None   Collection Time: 12/01/23  8:19 AM   Specimen: Wound;  Abscess  Result Value Ref Range Status   Specimen Description   Final    WOUND Performed at [facility], [address], [place], [place] [address]    Special Requests   Final    NONE Performed at [facility], [address] [place], [place] [address]    Gram Stain NO WBC SEEN RARE GRAM POSITIVE COCCI IN PAIRS   Final   Culture   Final    ABUNDANT METHICILLIN RESISTANT STAPHYLOCOCCUS AUREUS FEW ESCHERICHIA COLI Confirmed Extended Spectrum Beta-Lactamase Producer (ESBL).  In bloodstream infections from ESBL organisms, carbapenems are preferred over piperacillin /tazobactam. They are shown to have a lower risk of mortality. FEW BACTEROIDES SPECIES BETA LACTAMASE POSITIVE Performed at [facility], [address] [place], [place] [address]    Report Status 12/05/2023 FINAL  Final   Organism ID, Bacteria METHICILLIN RESISTANT STAPHYLOCOCCUS AUREUS  Final   Organism ID, Bacteria ESCHERICHIA COLI  Final      Susceptibility   Escherichia coli - MIC*    AMPICILLIN >=32 RESISTANT Resistant     CEFEPIME  16 RESISTANT Resistant     CEFTAZIDIME RESISTANT Resistant     CEFTRIAXONE  >=64 RESISTANT Resistant     CIPROFLOXACIN >=4 RESISTANT Resistant     GENTAMICIN <=1 SENSITIVE Sensitive     IMIPENEM <=0.25 SENSITIVE Sensitive     TRIMETH/SULFA >=320 RESISTANT Resistant     AMPICILLIN/SULBACTAM 8 SENSITIVE Sensitive     PIP/TAZO <=4 SENSITIVE Sensitive ug/mL    * FEW ESCHERICHIA COLI   Methicillin resistant staphylococcus aureus - MIC*    CIPROFLOXACIN >=8 RESISTANT Resistant     ERYTHROMYCIN >=8 RESISTANT Resistant     GENTAMICIN <=0.5 SENSITIVE Sensitive     OXACILLIN >=4 RESISTANT Resistant     TETRACYCLINE <=1 SENSITIVE Sensitive     VANCOMYCIN  <=0.5  SENSITIVE Sensitive     TRIMETH/SULFA >=320 RESISTANT Resistant     CLINDAMYCIN <=0.25 SENSITIVE Sensitive     RIFAMPIN <=0.5 SENSITIVE Sensitive     Inducible Clindamycin NEGATIVE Sensitive     LINEZOLID  2 SENSITIVE Sensitive     * ABUNDANT METHICILLIN RESISTANT STAPHYLOCOCCUS AUREUS     Labs: BNP (last 3 results) Recent Labs    03/30/23 0918  BNP 17.5   Basic Metabolic Panel: Recent Labs  Lab 11/30/23 0635 12/02/23 0638 12/05/23 0500  NA 138 138 139  K 3.5 3.7 3.6  CL 105 106 108  CO2 26 23 23   GLUCOSE 83 93 92  BUN <5* 7* 9  CREATININE 0.71 0.87 0.86  CALCIUM  7.8* 7.7* 8.1*  MG 2.0  --   --    CBC: Recent Labs  Lab 11/30/23 0635 12/02/23 0638 12/05/23 0500  WBC 4.2 5.5 4.4  NEUTROABS  --  3.8 2.7  HGB 10.2* 9.2* 10.1*  HCT 32.1* 30.3* 32.1*  MCV 85.8 88.1 88.9  PLT 234 330 403*   CBG: Recent Labs  Lab 12/02/23 1212 12/02/23 1852 12/02/23 2009 12/02/23 2356 12/03/23 0356  GLUCAP 92 86 95 88 102*   Urinalysis    Component Value Date/Time   COLORURINE YELLOW (A) 11/25/2023 1700   APPEARANCEUR HAZY (A) 11/25/2023 1700   LABSPEC >1.046 (H) 11/25/2023 1700   PHURINE 5.0 11/25/2023 1700   GLUCOSEU NEGATIVE 11/25/2023 1700   HGBUR MODERATE (A) 11/25/2023 1700   BILIRUBINUR NEGATIVE 11/25/2023 1700   KETONESUR NEGATIVE 11/25/2023 1700   PROTEINUR 30 (A) 11/25/2023 1700   NITRITE NEGATIVE 11/25/2023 1700   LEUKOCYTESUR SMALL (A) 11/25/2023  1700   Sepsis Labs Recent Labs  Lab 11/30/23 0635 12/02/23 0638 12/05/23 0500  WBC 4.2 5.5 4.4    Procedures/Studies: CT FEMUR LEFT W CONTRAST Result Date: 11/29/2023 CLINICAL DATA:  Sepsis.  Left upper thigh cellulitis. EXAM: CT OF THE LOWER LEFT EXTREMITY WITH CONTRAST TECHNIQUE: Multidetector CT imaging of the lower left extremity was performed according to the standard protocol following intravenous contrast administration. RADIATION DOSE REDUCTION: This exam was performed according to the departmental  dose-optimization program which includes automated exposure control, adjustment of the mA and/or kV according to patient size and/or use of iterative reconstruction technique. CONTRAST:  OMNIPAQUE  IOHEXOL  300 MG/ML  SOLN COMPARISON:  CT left femur dated 11/25/2023. FINDINGS: Bones/Joint/Cartilage No acute fracture or dislocation. No destructive osseous lesion. Moderate degenerative changes of the left hip with joint space narrowing and marginal osteophytosis. Degenerative changes of the pubic symphysis. Moderate degenerative changes of the left knee. Ligaments Ligaments are suboptimally evaluated by CT. Soft tissue and Muscles Persistent subcutaneous edema and fat stranding extending along the left proximal to mid medial thigh with overlying skin thickening. Increased subcutaneous edema and fat stranding along the left hip and thigh with overlying skin thickening. There is increased intramuscular edema within the left adductor compartment musculature with similar to slightly decreased stranding/edema extending along the left gracilis muscle. No discrete loculated fluid collection identified. No soft tissue gas. Irregularity of the skin along the posterior proximal to mid left thigh is again noted and could reflect a wound. Mild fatty infiltration of the semimembranosus muscle is again noted. Moderate atherosclerotic vascular calcifications. IMPRESSION: 1. Persistent subcutaneous edema and fat stranding extending along the left proximal to mid medial thigh with overlying skin thickening. Increased subcutaneous edema and fat stranding along the left hip and thigh with overlying skin thickening. These findings are compatible with cellulitis. No loculated fluid collection. 2. Increased intramuscular edema in the left adductor compartment musculature with similar to slightly decreased stranding/edema extending along the left gracilis muscle, concerning for myositis. No discrete intramuscular collection identified.  Electronically Signed   By: Mannie Seek M.D.   On: 11/29/2023 15:58   DG Chest Portable 1 View Result Date: 11/25/2023 CLINICAL DATA:  Shortness of breath, weakness, nausea EXAM: PORTABLE CHEST 1 VIEW COMPARISON:  08/24/2023 FINDINGS: Power injectable right Port-A-Cath tip: SVC. Atherosclerotic calcification of the aortic arch. Upper zone pulmonary vascular prominence raises the possibility of pulmonary venous hypertension. Upper normal heart size. Suspected linear/bandlike subsegmental atelectasis or scarring at the left lung base. Deep sulcus on the left but without compelling findings of pneumothorax. IMPRESSION: 1. Upper zone pulmonary vascular prominence raises the possibility of pulmonary venous hypertension. 2. Suspected linear/bandlike subsegmental atelectasis or scarring at the left lung base. 3. Aortic Atherosclerosis (ICD10-I70.0). Electronically Signed   By: Freida Jes M.D.   On: 11/25/2023 11:17   CT FEMUR LEFT W CONTRAST Result Date: 11/25/2023 CLINICAL DATA:  Cellulitis. Tender. Evaluate for necrotizing fasciitis or abscess. Increased weakness and nausea after cancer treatment 3 days ago. Weeping wound to back of left thigh. History of multiple myeloma. EXAM: CT OF THE LOWER LEFT EXTREMITY WITH CONTRAST TECHNIQUE: Multidetector CT imaging of the lower left extremity was performed according to the standard protocol following intravenous contrast administration. RADIATION DOSE REDUCTION: This exam was performed according to the departmental dose-optimization program which includes automated exposure control, adjustment of the mA and/or kV according to patient size and/or use of iterative reconstruction technique. CONTRAST:  110mL OMNIPAQUE  IOHEXOL  350 MG/ML SOLN COMPARISON:  Left knee radiographs 09/03/2022, pelvis and left hip radiographs 12/05/2017 FINDINGS: Bones/Joint/Cartilage No definite destructive lytic lesion is seen within the left femur in this patient with history of  multiple myeloma. Moderate left femoroacetabular joint space narrowing and moderate superolateral left acetabular degenerative osteophytosis. Severe pubic symphysis joint space narrowing with subchondral sclerosis and mild superior osteophytosis. On coronal images there is partial visualization of the right knee with moderate to severe medial current joint space narrowing and subchondral sclerosis and mild to moderate peripheral osteophytosis. No acute fracture dislocation.  No cortical erosion. Ligaments Suboptimally assessed by CT. Muscles and Tendons Mild fatty infiltration of the semimembranosus muscle. Inflammatory fat stranding at the posteromedial border of the cm cyst muscle (axial series 7, images 106 through 166, with trace fluid just posteromedial to the muscle in this region. Similar moderate inflammatory stranding/edema within the fat posterior and medial to the gracilis muscle. No air is seen tracking along the fascial planes to indicate necrotizing fasciitis. Soft tissues Moderate medial and posteromedial and mild anteromedial proximal to mid left thigh subcutaneous fat edema and swelling. Moderate thickening of the medial and posteromedial proximal to mid left thigh skin. No walled-off fluid collection is seen to indicate an abscess. Mild focal thickening/irregularity of the skin at the posterior aspect of the proximal to mid left thigh (axial series 7, image 111 and sagittal series 11, image 117). This may represent the region of the reported posterior thigh weeping wound. Moderate to high-grade atherosclerotic calcifications. IMPRESSION: 1. Moderate medial and posteromedial and mild anteromedial proximal to mid left thigh subcutaneous fat edema and swelling. Moderate thickening of the regional left thigh skin. No walled-off fluid collection is seen to indicate an abscess. 2. Mild focal thickening/irregularity of the skin at the posterior aspect of the proximal to mid left thigh. This may represent  the region of the reported posterior thigh weeping wound. 3. No high-grade fascial thickening, subcutaneous air, or perifascial air is seen to suggest CT evidence of necrotizing fasciitis. 4. No definite destructive lytic lesion is seen within the left femur in this patient with history of multiple myeloma. 5. 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 Electronically Signed   By: Bertina Broccoli M.D.   On: 11/25/2023 11:08   CT ABDOMEN PELVIS W CONTRAST Result Date: 11/25/2023 CLINICAL DATA:  Increased weakness with nausea after cancer treatment. Worsening abdominal pain. Personal history of multiple myeloma. EXAM: CT ANGIOGRAPHY CHEST CT ABDOMEN AND PELVIS WITH CONTRAST TECHNIQUE: Multidetector CT imaging of the chest was performed using the standard protocol during bolus administration of intravenous contrast. Multiplanar CT image reconstructions and MIPs were obtained to evaluate the vascular anatomy. Multidetector CT imaging of the abdomen and pelvis was performed using the standard protocol during bolus administration of intravenous contrast. RADIATION DOSE REDUCTION: This exam was performed according to the departmental dose-optimization program which includes automated exposure control, adjustment of the mA and/or kV according to patient size and/or use of iterative reconstruction technique. CONTRAST:  OMNIPAQUE  IOHEXOL  350 MG/ML SOLN COMPARISON:  PET-CT 09/30/2022. FINDINGS: CTA CHEST FINDINGS Cardiovascular: The heart is enlarged. Coronary artery calcification is evident. Moderate atherosclerotic calcification is noted in the wall of the thoracic aorta. No thoracic aortic aneurysm or dissection. There is no filling defect within the opacified pulmonary arteries to suggest the presence of an acute pulmonary embolus. Right Port-A-Cath tip is positioned in the distal SVC. Mediastinum/Nodes: No mediastinal lymphadenopathy. There is no hilar  lymphadenopathy. The esophagus has normal imaging features. There is  no axillary lymphadenopathy. Lungs/Pleura: Dependent atelectasis noted in the lung bases, right greater than left. 2 mm left upper lobe nodule identified on image 35/8. 2 mm left lower lobe nodule identified on 71/8. No pulmonary edema or pleural effusion. Musculoskeletal: No overtly suspicious lytic or sclerotic osseous abnormality in this patient with a reported history of multiple myeloma. Review of the MIP images confirms the above findings. CT ABDOMEN and PELVIS FINDINGS Hepatobiliary: No suspicious focal abnormality within the liver parenchyma. Scattered tiny hypodensities in the liver parenchyma are too small to characterize but are statistically most likely benign. No followup imaging is recommended. There is no evidence for gallstones, gallbladder wall thickening, or pericholecystic fluid. No intrahepatic or extrahepatic biliary dilation. Pancreas: No focal mass lesion. No dilatation of the main duct. No intraparenchymal cyst. No peripancreatic edema. Spleen: No splenomegaly. No suspicious focal mass lesion. Adrenals/Urinary Tract: No adrenal nodule or mass. Kidneys unremarkable. No evidence for hydroureter. The urinary bladder appears normal for the degree of distention. Stomach/Bowel: Stomach is unremarkable. No gastric wall thickening. No evidence of outlet obstruction. Duodenum is normally positioned as is the ligament of Treitz. No small bowel wall thickening. No small bowel dilatation. The terminal ileum is normal. The appendix is normal. No gross colonic mass. No colonic wall thickening. Vascular/Lymphatic: There is advanced atherosclerotic calcification of the abdominal aorta without aneurysm. There is no gastrohepatic or hepatoduodenal ligament lymphadenopathy. No retroperitoneal or mesenteric lymphadenopathy. No pelvic sidewall lymphadenopathy. Reproductive: Calcified uterine fibroids evident. Endometrial stripe appears thickened  at 13 mm on sagittal imaging. There is no adnexal mass. Other: No intraperitoneal free fluid. Musculoskeletal: No worrisome lytic or sclerotic osseous abnormality. Degenerative changes noted lumbar spine. Review of the MIP images confirms the above findings. IMPRESSION: 1. No CT evidence for acute pulmonary embolus. 2. No acute findings in the abdomen or pelvis. Specifically, no findings to explain the patient's history of abdominal pain. 3. Endometrial stripe appears thickened at 13 mm on sagittal imaging. Correlation for abnormal vaginal bleeding recommended. Follow-up pelvic ultrasound recommended to further evaluate as endometrial neoplasm could have this appearance. 4. Calcified uterine fibroids. 5. Two 2 mm left lung nodules. No follow-up needed if patient is low-risk (and has no known or suspected primary neoplasm). Non-contrast chest CT can be considered in 12 months if patient is high-risk. This recommendation follows the consensus statement: Guidelines for Management of Incidental Pulmonary Nodules Detected on CT Images: From the Fleischner Society 2017; Radiology 2017; 284:228-243. 6.  Aortic Atherosclerosis (ICD10-I70.0). Electronically Signed   By: Donnal Fusi M.D.   On: 11/25/2023 10:04   CT Angio Chest PE W and/or Wo Contrast Result Date: 11/25/2023 CLINICAL DATA:  Increased weakness with nausea after cancer treatment. Worsening abdominal pain. Personal history of multiple myeloma. EXAM: CT ANGIOGRAPHY CHEST CT ABDOMEN AND PELVIS WITH CONTRAST TECHNIQUE: Multidetector CT imaging of the chest was performed using the standard protocol during bolus administration of intravenous contrast. Multiplanar CT image reconstructions and MIPs were obtained to evaluate the vascular anatomy. Multidetector CT imaging of the abdomen and pelvis was performed using the standard protocol during bolus administration of intravenous contrast. RADIATION DOSE REDUCTION: This exam was performed according to the  departmental dose-optimization program which includes automated exposure control, adjustment of the mA and/or kV according to patient size and/or use of iterative reconstruction technique. CONTRAST:  OMNIPAQUE  IOHEXOL  350 MG/ML SOLN COMPARISON:  PET-CT 09/30/2022. FINDINGS: CTA CHEST FINDINGS Cardiovascular: The heart is enlarged. Coronary artery calcification is evident. Moderate atherosclerotic calcification is noted in  the wall of the thoracic aorta. No thoracic aortic aneurysm or dissection. There is no filling defect within the opacified pulmonary arteries to suggest the presence of an acute pulmonary embolus. Right Port-A-Cath tip is positioned in the distal SVC. Mediastinum/Nodes: No mediastinal lymphadenopathy. There is no hilar lymphadenopathy. The esophagus has normal imaging features. There is no axillary lymphadenopathy. Lungs/Pleura: Dependent atelectasis noted in the lung bases, right greater than left. 2 mm left upper lobe nodule identified on image 35/8. 2 mm left lower lobe nodule identified on 71/8. No pulmonary edema or pleural effusion. Musculoskeletal: No overtly suspicious lytic or sclerotic osseous abnormality in this patient with a reported history of multiple myeloma. Review of the MIP images confirms the above findings. CT ABDOMEN and PELVIS FINDINGS Hepatobiliary: No suspicious focal abnormality within the liver parenchyma. Scattered tiny hypodensities in the liver parenchyma are too small to characterize but are statistically most likely benign. No followup imaging is recommended. There is no evidence for gallstones, gallbladder wall thickening, or pericholecystic fluid. No intrahepatic or extrahepatic biliary dilation. Pancreas: No focal mass lesion. No dilatation of the main duct. No intraparenchymal cyst. No peripancreatic edema. Spleen: No splenomegaly. No suspicious focal mass lesion. Adrenals/Urinary Tract: No adrenal nodule or mass. Kidneys unremarkable. No evidence for  hydroureter. The urinary bladder appears normal for the degree of distention. Stomach/Bowel: Stomach is unremarkable. No gastric wall thickening. No evidence of outlet obstruction. Duodenum is normally positioned as is the ligament of Treitz. No small bowel wall thickening. No small bowel dilatation. The terminal ileum is normal. The appendix is normal. No gross colonic mass. No colonic wall thickening. Vascular/Lymphatic: There is advanced atherosclerotic calcification of the abdominal aorta without aneurysm. There is no gastrohepatic or hepatoduodenal ligament lymphadenopathy. No retroperitoneal or mesenteric lymphadenopathy. No pelvic sidewall lymphadenopathy. Reproductive: Calcified uterine fibroids evident. Endometrial stripe appears thickened at 13 mm on sagittal imaging. There is no adnexal mass. Other: No intraperitoneal free fluid. Musculoskeletal: No worrisome lytic or sclerotic osseous abnormality. Degenerative changes noted lumbar spine. Review of the MIP images confirms the above findings. IMPRESSION: 1. No CT evidence for acute pulmonary embolus. 2. No acute findings in the abdomen or pelvis. Specifically, no findings to explain the patient's history of abdominal pain. 3. Endometrial stripe appears thickened at 13 mm on sagittal imaging. Correlation for abnormal vaginal bleeding recommended. Follow-up pelvic ultrasound recommended to further evaluate as endometrial neoplasm could have this appearance. 4. Calcified uterine fibroids. 5. Two 2 mm left lung nodules. No follow-up needed if patient is low-risk (and has no known or suspected primary neoplasm). Non-contrast chest CT can be considered in 12 months if patient is high-risk. This recommendation follows the consensus statement: Guidelines for Management of Incidental Pulmonary Nodules Detected on CT Images: From the Fleischner Society 2017; Radiology 2017; 284:228-243. 6.  Aortic Atherosclerosis (ICD10-I70.0). Electronically Signed   By: Donnal Fusi M.D.   On: 11/25/2023 10:04   CT HEAD WO CONTRAST ( ) Result Date: 11/25/2023 CLINICAL DATA:  73 year old female with headache and fever. Recent cancer treatment. Multiple myeloma. EXAM: CT HEAD WITHOUT CONTRAST TECHNIQUE: Contiguous axial images were obtained from the base of the skull through the vertex without intravenous contrast. RADIATION DOSE REDUCTION: This exam was performed according to the departmental dose-optimization program which includes automated exposure control, adjustment of the mA and/or kV according to patient size and/or use of iterative reconstruction technique. COMPARISON:  Head CT 05/26/2021. FINDINGS: Brain: Cerebral volume is stable, normal for age. No midline shift, ventriculomegaly, mass effect, evidence  of mass lesion, intracranial hemorrhage or evidence of cortically based acute infarction. Stable bilateral basal ganglia vascular calcifications. Confluent and extensive bilateral cerebral white matter hypodensity is chronic but progressed since 2022, fairly symmetric. No suspicion of vasogenic edema at this time. Vascular: No suspicious intracranial vascular hyperdensity. Calcified atherosclerosis at the skull base. Skull: Stable. No acute or suspicious osseous abnormality identified. Sinuses/Orbits: Subtotal opacification of the left tympanic cavity and mastoids, new since 2022. Other Visualized paranasal sinuses and mastoids are stable and well aerated. Other: No acute orbit or scalp soft tissue finding. Negative visible nasopharynx. IMPRESSION: 1. Left middle ear and mastoid opacification is new since 2022. Consider with Acute Otitis Media with mastoid effusion. 2. 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 Electronically Signed   By: Marlise Simpers M.D.   On: 11/25/2023 09:30    Time coordinating discharge: 50 mins  SIGNED:  Unk Garb, DO [facility] 12/06/23,  12:26 PM

## 2023-12-06 NOTE — TOC Progression Note (Signed)
 Transition of Care [facility]) - Progression Note    Patient Details  Name: ADAIJAH PARVEEN MRN: [id number] Date of Birth: 1951/07/14  [facility] of [facility] CM/SW Contact  Baird Bombard, RN Phone Number: 12/06/2023, 12:16 PM  Clinical Narrative:    Attempt to reach [place] from [facility] regarding. No answer. Left a message.         Expected Discharge Plan and Services         Expected Discharge Date: 12/05/23                                     Social Determinants of Health (SDOH) Interventions SDOH Screenings   Food Insecurity: No Food Insecurity (09/04/2022)  Housing: Low Risk  (09/04/2022)  Transportation Needs: No Transportation Needs (09/04/2022)  Utilities: Not At Risk (09/04/2022)  Depression (PHQ2-9): Low Risk  (10/14/2022)  Tobacco Use: Medium Risk (12/01/2023)    Readmission Risk Interventions    05/27/2021   10:42 AM  Readmission Risk Prevention Plan  Transportation Screening Complete  Medication Review (RN Care Manager) Complete  PCP or Specialist appointment within 3-5 days of discharge Complete  HRI or Home Care Consult Complete  SW Recovery Care/Counseling Consult Complete  Palliative Care Screening Complete  Skilled Nursing Facility Not Applicable

## 2023-12-06 NOTE — Progress Notes (Signed)
 PROGRESS NOTE    Brandi [name]  ZOX:[id number] DOB: [birth year] DOA: 11/25/2023 PCP: [name], MD  Subjective: Patient seen and examined.   ID and surgery decided yesterday that pt should have wound vac. Discharge delayed due to short notice in trying to obtain home wound vac. Pt wants to go home today. She asked that I notify her oncologist about her admission. I have sent secure chat to Dr. Valentine Gasmen.    Hospital Course: HPI: Brandi [name] is a 73 y.o. female with medical history significant of MM on chemotherapy, chronic HFpEF, HTN, hypothyroidism, presented with GI symptoms including nauseous vomiting abdominal pain.  She also complains about worsening of left leg swelling pain and drainage.   Patient has history of multiple myeloma since 2018 and has been on chemotherapy, last chemo was this past Tuesday.  Started on Wednesday, patient started to feel nauseous and cramping-like lower abdominal pain, with multiple vomiting on Wednesday but no more vomiting since Thursday.  Location of abdominal pain is rather poorly defined.  No exacerbating factors.  2 days ago she also started to notice " a boil" in the left inner thigh, gradually getting bigger and ruptured yesterday with thick yellowish pus coming out.  She has had some chills but no fever.  No chest pain shortness of breath no diarrhea denies any urinary symptoms.  Denied any active bleeding.  Significant Events: Admitted 11/25/2023 cellulitis 12-01-2023 operative I&D left thigh abscess 12-04-2023 operative cultures growing MRSA, morganella, E. Coli  Significant Labs: WBC 3.5, HgB 11.6, plt 63 Na 136, K 2.4, CO2 of 18, BUN 19, Scr 0.91. glu 90 Ca 7.7, TP 6.6, alb 3.8, AST 18, ALT 10, alk phos 64 Mg 1.5  Significant Imaging Studies: CT abd/pelvis, CTPA No CT evidence for acute pulmonary embolus. 2. No acute findings in the abdomen or pelvis. Specifically, no findings to explain the patient's history of abdominal pain. 3.  Endometrial stripe appears thickened at 13 mm on sagittal imaging. Correlation for abnormal vaginal bleeding recommended. Follow-up pelvic ultrasound recommended to further evaluate as endometrial neoplasm could have this appearance. 4. Calcified uterine fibroids. 5. Two 2 mm left lung nodules.  CT left femur Moderate medial and posteromedial and mild anteromedial proximal to mid left thigh subcutaneous fat edema and swelling. Moderate thickening of the regional left thigh skin. No walled-off fluid collection is seen to indicate an abscess. 2. Mild focal thickening/irregularity of the skin at the posterior aspect of the proximal to mid left thigh. This may represent the region of the reported posterior thigh weeping wound. 3. No high-grade fascial thickening, subcutaneous air, or perifascial air is seen to suggest CT evidence of necrotizing fasciitis. 4. No definite destructive lytic lesion is seen within the left femur in this patient with history of multiple myeloma. 5. 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 CT head Left middle ear and mastoid opacification is new since 2022. Consider with Acute Otitis Media with mastoid effusion. 2. 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 CXR Upper zone pulmonary vascular prominence raises the possibility of pulmonary venous hypertension. 2. Suspected linear/bandlike subsegmental atelectasis or scarring at the left lung base. 3. Aortic Atherosclerosis 11-29-2023 CT left femur Persistent subcutaneous edema and fat stranding extending along the left proximal to mid medial thigh with overlying skin thickening. Increased subcutaneous edema and fat stranding along the left hip and thigh with overlying skin thickening. These  findings are compatible with cellulitis. No loculated fluid collection. 2. Increased intramuscular edema  in the left adductor  compartment musculature with similar to slightly decreased stranding/edema extending along the left gracilis muscle, concerning for myositis. No discrete intramuscular collection identified  Antibiotic Therapy: Anti-infectives (From admission, onward)    Start     Dose/Rate Route Frequency Ordered Stop   11/27/23 0900  cefTRIAXone  (ROCEPHIN ) 2 g in sodium chloride  0.9 % 100 mL IVPB        2 g 200 mL/hr over 30 Minutes Intravenous Every 24 hours 11/27/23 0747 12/02/23 0859   11/26/23 1445  azithromycin  (ZITHROMAX ) tablet 500 mg        500 mg Oral Daily 11/26/23 1444 12/01/23 0959   11/26/23 1000  vancomycin  (VANCOCIN ) IVPB 1000 mg/200 mL premix        1,000 mg 200 mL/hr over 60 Minutes Intravenous Every 24 hours 11/25/23 1303 11/29/23 1232   11/25/23 1700  ceFAZolin  (ANCEF ) IVPB 1 g/50 mL premix  Status:  Discontinued        1 g 100 mL/hr over 30 Minutes Intravenous Every 8 hours 11/25/23 1307 11/27/23 0747   11/25/23 1330  vancomycin  (VANCOREADY) IVPB 500 mg/100 mL        500 mg 100 mL/hr over 60 Minutes Intravenous  Once 11/25/23 1247 11/25/23 1523   11/25/23 0845  piperacillin -tazobactam (ZOSYN ) IVPB 3.375 g        3.375 g 100 mL/hr over 30 Minutes Intravenous  Once 11/25/23 0830 11/25/23 0934   11/25/23 0830  cefTRIAXone  (ROCEPHIN ) 2 g in sodium chloride  0.9 % 100 mL IVPB  Status:  Discontinued        2 g 200 mL/hr over 30 Minutes Intravenous Once 11/25/23 0817 11/25/23 0830   11/25/23 0830  vancomycin  (VANCOCIN ) IVPB 1000 mg/200 mL premix        1,000 mg 200 mL/hr over 60 Minutes Intravenous  Once 11/25/23 0817 11/25/23 1059       Procedures: 12-01-2023 I&D left medial thigh abscess  Consultants: ID General Surgery    Assessment and Plan: * Sepsis (HCC) On admission through 11-29-2023 Hypotension, tachycardia, elevated lactate 2.2 on presentation with known abscess given concern for severe sepsis.  Requiring multiple IV fluid boluses to maintain MAP  greater than 65.  No vasopressor medications required at this time.  Blood pressure appears to be improving.  Continue to monitor closely.  Blood cultures drawn.  Broad-spectrum antibiotic coverage on board.  Appears to be resolving.  No longer requiring midodrine .   11-30-2023 resolved.   Cellulitis and abscess of left lower extremity On admission through 11-29-2023 CT scan on presentation noting mostly medial left thigh subcutaneous edema, but no walled off fluid collection to indicate abscess.  Likely etiology of patient's sepsis.  Broad-spectrum antibiotic coverage on board initially cefazolin  and vancomycin .  Transitioning to ceftriaxone  2 g every 24 hours secondary to pneumonia coverage on 5/4.  Small weeping area with induration, daily dressing change.  Will order wound care consult.  Given continued induration induration and tenderness, will repeat CT of the area to assess for developed underlying abscess.  If abscess is present, will likely need to get general surgery involved for I&D.    11-30-2023 repeat CT femur yesterday still shows no abscess. Pt has been on IV rocephin /vanco for 5 days now. While her labs are improved, pt still with clinically significant cellulitis. Will ask ID consult to see patient to see if there are any abx changes that need  to be made.  I don't see good anaerobic coverage right now but for a medial thigh cellulitis, I'm not sure what organisms to target.  12-01-2023 s/p I&D in OR today. 8 x 8 x 2 cm abscess/necrotic tissue was drained/debrided in OR. Cultures taken. IV dilaudid  prn ordered. Remains on IV zosyn  and po zyvox .   12-02-2023 continue with zyvox  and IV zosyn .  Left thigh still numb from Exparel /lidocaine  that was injected into wound edges. Afebrile. Intra-op wound cx growing staph aureus and GNR.  General surgery approved starting chemical DVT prophylaxis today.  Awaiting ID recs. Continue prn IV dilaudid .  12-03-2023 remains on IV zosyn /po zyvox .  Awaiting final cultures. Will need home health RN for wound care. And ambulatory referral to wound care clinic.  12-04-2023 Operative cultures growing MRSA, Morganella and E. Coli.  On IV zosyn /PO Zyvox  which covers all organisms. Pt has port-a-cath. So conceivably, she could get IV Zosyn  at home if ID thinks this is appropriate. Will await ID to return on Monday to decide on home abx regimen.  12-05-2023 ID thinks pt can go home on IV Invanz and po zyvox . Will await final OPAT orders.  *update OPAT Orders Discharge antibiotics: Ertapenem 1 gram IV every 24hrs  AND PO linezolid   Duration: 7 days End Date: 12/12/23   Pt has PORT- port care   Labs weekly while on IV antibiotics: _X_ CBC with differential   _X_ CMP  *update. General surgery wants pt go to home on wound vac and continue this as outpatient.   12-06-2023 wound vac placed yesterday. Ordered by surgery. Ready for DC today as soon as home wound vac machine is delivered.  COPD (chronic obstructive pulmonary disease) (HCC) 11-30-2023 not convinced she has pneumonia. Pt does have hx of COPD. On IV rocephin . Not wheezing or hypoxic.  12-01-2023 stable   12-02-2023 stable  12-03-2023 stable  12-04-2023 stable  12-05-2023 stable  12-06-2023 stable.  Chronic heart failure with preserved ejection fraction (HFpEF) (HCC) 11-30-2023 chronic. Not hypoxic. Lying flat in bed.  12-01-2023 stable  12-02-2023 stable  12-03-2023 stable  12-04-2023 stable  12-05-2023 stable  12-06-2023 stable.  Essential hypertension 11-30-2023 stable. Pt with some swelling in left leg. Still restart low dose lasix  along with kcl supplementation.  12-01-2023 hold lasix  as her left leg swelling likely due to abscess/cellulitis.  12-02-2023 stable  12-03-2023 stable  12-04-2023 stable  12-06-2023 stable.  12-05-2023 stable  Hypokalemia-resolved as of 11/30/2023 Treated and resolved.  Multiple myeloma in relapse  (HCC) 12-01-2023 immunosuppressed due to MM.  12-02-2023 stable  12-03-2023 stable  12-04-2023 stable  12-05-2023 stable  12-06-2023 stable. Will need to delay any chemo until after her IV ABX are completed.  She asked that I notify her oncologist about her admission. I have sent secure chat to Dr. Valentine Gasmen.   DVT prophylaxis: enoxaparin  (LOVENOX ) injection 40 mg Start: 12/02/23 1800    Code Status: Full Code Family Communication: no family at bedside. Pt is decisional. Disposition Plan: return home Reason for continuing need for hospitalization: stable for DC.  Objective: Vitals:   12/06/23 0000 12/06/23 0355 12/06/23 0853 12/06/23 1122  BP: (!) 149/78 114/67 131/71 127/79  Pulse: 62 61 67 69  Resp:      Temp: 98.3 F (36.8 C) 97.7 F (36.5 C) 98.8 F (37.1 C) 99 F (37.2 C)  TempSrc: Oral Oral Oral Oral  SpO2: 95% 97% 98% 97%  Weight:      Height:  Intake/Output Summary (Last 24 hours) at 12/06/2023 1224 Last data filed at 12/06/2023 1049 Gross per 24 hour  Intake 619.17 ml  Output 400 ml  Net 219.17 ml   Filed Weights   11/25/23 0649 12/01/23 0702  Weight: 71.2 kg 71.2 kg    Examination:  Physical Exam Vitals and nursing note reviewed.  Constitutional:      General: She is not in acute distress.    Appearance: She is normal weight. She is not toxic-appearing or diaphoretic.  HENT:     Head: Normocephalic and atraumatic.     Nose: Nose normal.  Cardiovascular:     Rate and Rhythm: Normal rate and regular rhythm.  Pulmonary:     Effort: Pulmonary effort is normal.     Breath sounds: Normal breath sounds.  Abdominal:     General: Bowel sounds are normal.     Palpations: Abdomen is soft.  Skin:    General: Skin is warm and dry.     Capillary Refill: Capillary refill takes less than 2 seconds.     Comments: Wound vac dressing intact left medial thigh.  Neurological:     Mental Status: She is alert and oriented to person, place, and time.      Data Reviewed: I have personally reviewed following labs and imaging studies  CBC: Recent Labs  Lab 11/30/23 0635 12/02/23 0638 12/05/23 0500  WBC 4.2 5.5 4.4  NEUTROABS  --  3.8 2.7  HGB 10.2* 9.2* 10.1*  HCT 32.1* 30.3* 32.1*  MCV 85.8 88.1 88.9  PLT 234 330 403*   Basic Metabolic Panel: Recent Labs  Lab 11/30/23 0635 12/02/23 0638 12/05/23 0500  NA 138 138 139  K 3.5 3.7 3.6  CL 105 106 108  CO2 26 23 23   GLUCOSE 83 93 92  BUN <5* 7* 9  CREATININE 0.71 0.87 0.86  CALCIUM  7.8* 7.7* 8.1*  MG 2.0  --   --    GFR: Estimated Creatinine Clearance: 55.4 mL/min (by C-G formula based on SCr of 0.86 mg/dL). BNP (last 3 results) Recent Labs    03/30/23 0918  BNP 17.5   CBG: Recent Labs  Lab 12/02/23 1212 12/02/23 1852 12/02/23 2009 12/02/23 2356 12/03/23 0356  GLUCAP 92 86 95 88 102*   Recent Results (from the past 240 hours)  Expectorated Sputum Assessment w Gram Stain, Rflx to Resp Cult     Status: None   Collection Time: 11/27/23 12:15 PM   Specimen: Sputum  Result Value Ref Range Status   Specimen Description SPUTUM  Final   Special Requests NONE  Final   Sputum evaluation   Final    THIS SPECIMEN IS ACCEPTABLE FOR SPUTUM CULTURE Performed at [facility], [address], [place], [place] [address]    Report Status 11/27/2023 FINAL  Final  Culture, Respiratory w Gram Stain     Status: None   Collection Time: 11/27/23 12:15 PM   Specimen: SPU  Result Value Ref Range Status   Specimen Description   Final    SPUTUM Performed at [facility], [address], [place], [place] [address]    Special Requests   Final    NONE Reflexed from [id number] Performed at [facility], [address] [place], [place] [address]    Gram Stain   Final    RARE WBC PRESENT, PREDOMINANTLY PMN FEW GRAM POSITIVE COCCI    Culture   Final    Normal respiratory flora-no Staph aureus or Pseudomonas  seen Performed at [facility]  [facility], [address] [place], [place] [address]    Report Status 11/29/2023 FINAL  Final  Aerobic Culture w Gram Stain (superficial specimen)     Status: None   Collection Time: 11/30/23  4:00 PM   Specimen: Wound  Result Value Ref Range Status   Specimen Description   Final    WOUND Performed at [facility], [address] [place], [place] [address]    Special Requests   Final     THIGH Performed at [facility], [address] [place], [place] [address]    Gram Stain   Final    NO WBC SEEN RARE GRAM NEGATIVE RODS Performed at [facility], [address] [place], [place] [address]    Culture   Final    ABUNDANT METHICILLIN RESISTANT STAPHYLOCOCCUS AUREUS ABUNDANT ESCHERICHIA COLI Confirmed Extended Spectrum Beta-Lactamase Producer (ESBL).  In bloodstream infections from ESBL organisms, carbapenems are preferred over piperacillin /tazobactam. They are shown to have a lower risk of mortality. MODERATE MORGANELLA MORGANII    Report Status 12/03/2023 FINAL  Final   Organism ID, Bacteria METHICILLIN RESISTANT STAPHYLOCOCCUS AUREUS  Final   Organism ID, Bacteria ESCHERICHIA COLI  Final   Organism ID, Bacteria MORGANELLA MORGANII  Final      Susceptibility   Escherichia coli - MIC*    AMPICILLIN >=32 RESISTANT Resistant     CEFEPIME  16 RESISTANT Resistant     CEFTAZIDIME RESISTANT Resistant     CEFTRIAXONE  >=64 RESISTANT Resistant     CIPROFLOXACIN >=4 RESISTANT Resistant     GENTAMICIN <=1 SENSITIVE Sensitive     IMIPENEM <=0.25 SENSITIVE Sensitive     TRIMETH/SULFA >=320 RESISTANT Resistant     AMPICILLIN/SULBACTAM 4 SENSITIVE Sensitive     PIP/TAZO <=4 SENSITIVE Sensitive ug/mL    * ABUNDANT ESCHERICHIA COLI   Morganella morganii - MIC*    AMPICILLIN >=32 RESISTANT Resistant     CEFTAZIDIME <=1 SENSITIVE Sensitive     CIPROFLOXACIN <=0.25 SENSITIVE Sensitive     GENTAMICIN <=1 SENSITIVE Sensitive     IMIPENEM 2 SENSITIVE Sensitive      TRIMETH/SULFA <=20 SENSITIVE Sensitive     AMPICILLIN/SULBACTAM >=32 RESISTANT Resistant     PIP/TAZO <=4 SENSITIVE Sensitive ug/mL    * MODERATE MORGANELLA MORGANII   Methicillin resistant staphylococcus aureus - MIC*    CIPROFLOXACIN >=8 RESISTANT Resistant     ERYTHROMYCIN >=8 RESISTANT Resistant     GENTAMICIN <=0.5 SENSITIVE Sensitive     OXACILLIN >=4 RESISTANT Resistant     TETRACYCLINE <=1 SENSITIVE Sensitive     VANCOMYCIN  1 SENSITIVE Sensitive     TRIMETH/SULFA >=320 RESISTANT Resistant     CLINDAMYCIN <=0.25 SENSITIVE Sensitive     RIFAMPIN <=0.5 SENSITIVE Sensitive     Inducible Clindamycin NEGATIVE Sensitive     LINEZOLID  2 SENSITIVE Sensitive     * ABUNDANT METHICILLIN RESISTANT STAPHYLOCOCCUS AUREUS  Aerobic/Anaerobic Culture w Gram Stain (surgical/deep wound)     Status: None   Collection Time: 12/01/23  8:19 AM   Specimen: Wound; Abscess  Result Value Ref Range Status   Specimen Description   Final    WOUND Performed at [facility], [address], [place], [place] [address]    Special Requests   Final    NONE Performed at [facility], [address] [place], [place] [address]    Gram Stain NO WBC SEEN RARE GRAM POSITIVE COCCI IN PAIRS   Final  Culture   Final    ABUNDANT METHICILLIN RESISTANT STAPHYLOCOCCUS AUREUS FEW ESCHERICHIA COLI Confirmed Extended Spectrum Beta-Lactamase Producer (ESBL).  In bloodstream infections from ESBL organisms, carbapenems are preferred over piperacillin /tazobactam. They are shown to have a lower risk of mortality. FEW BACTEROIDES SPECIES BETA LACTAMASE POSITIVE Performed at [facility], [address] [place], [place] [address]    Report Status 12/05/2023 FINAL  Final   Organism ID, Bacteria METHICILLIN RESISTANT STAPHYLOCOCCUS AUREUS  Final   Organism ID, Bacteria ESCHERICHIA COLI  Final      Susceptibility   Escherichia coli - MIC*    AMPICILLIN >=32 RESISTANT Resistant     CEFEPIME  16  RESISTANT Resistant     CEFTAZIDIME RESISTANT Resistant     CEFTRIAXONE  >=64 RESISTANT Resistant     CIPROFLOXACIN >=4 RESISTANT Resistant     GENTAMICIN <=1 SENSITIVE Sensitive     IMIPENEM <=0.25 SENSITIVE Sensitive     TRIMETH/SULFA >=320 RESISTANT Resistant     AMPICILLIN/SULBACTAM 8 SENSITIVE Sensitive     PIP/TAZO <=4 SENSITIVE Sensitive ug/mL    * FEW ESCHERICHIA COLI   Methicillin resistant staphylococcus aureus - MIC*    CIPROFLOXACIN >=8 RESISTANT Resistant     ERYTHROMYCIN >=8 RESISTANT Resistant     GENTAMICIN <=0.5 SENSITIVE Sensitive     OXACILLIN >=4 RESISTANT Resistant     TETRACYCLINE <=1 SENSITIVE Sensitive     VANCOMYCIN  <=0.5 SENSITIVE Sensitive     TRIMETH/SULFA >=320 RESISTANT Resistant     CLINDAMYCIN <=0.25 SENSITIVE Sensitive     RIFAMPIN <=0.5 SENSITIVE Sensitive     Inducible Clindamycin NEGATIVE Sensitive     LINEZOLID  2 SENSITIVE Sensitive     * ABUNDANT METHICILLIN RESISTANT STAPHYLOCOCCUS AUREUS     Radiology Studies: No results found.  Scheduled Meds:  aspirin  EC  81 mg Oral Daily   Chlorhexidine  Gluconate Cloth  6 each Topical Q0600   enoxaparin  (LOVENOX ) injection  40 mg Subcutaneous Q24H   linezolid   600 mg Oral Q12H   montelukast   10 mg Oral QHS   pantoprazole   40 mg Oral Daily   Continuous Infusions:  ertapenem 1 g (12/06/23 0939)     LOS: 11 days   Time spent: 50 minutes  Unk Garb, DO  [facility]  12/06/2023, 12:24 PM

## 2023-12-06 NOTE — Progress Notes (Signed)
 Late entry- patient seen on 5/12  Date of Admission:  11/25/2023     ID: Brandi [name] is a 73 y.o. female Principal Problem:   Sepsis (HCC) Active Problems:   Essential hypertension   Chronic heart failure with preserved ejection fraction (HFpEF) (HCC)   Cellulitis and abscess of left lower extremity   COPD (chronic obstructive pulmonary disease) (HCC)   Necrotizing cellulitis   Abscess   Multiple myeloma in relapse (HCC)  Pt seen with surgical team   Subjective: She is doing better  Medications:   aspirin  EC  81 mg Oral Daily   Chlorhexidine  Gluconate Cloth  6 each Topical Q0600   enoxaparin  (LOVENOX ) injection  40 mg Subcutaneous Q24H   linezolid   600 mg Oral Q12H   montelukast   10 mg Oral QHS   pantoprazole   40 mg Oral Daily    Objective: Vital signs in last 24 hours: Patient Vitals for the past 24 hrs:  BP Temp Temp src Pulse SpO2  12/06/23 1122 127/79 99 F (37.2 C) Oral 69 97 %  12/06/23 0853 131/71 98.8 F (37.1 C) Oral 67 98 %  12/06/23 0355 114/67 97.7 F (36.5 C) Oral 61 97 %  12/06/23 0000 (!) 149/78 98.3 F (36.8 C) Oral 62 95 %  12/05/23 1939 (!) 106/90 -- -- 76 95 %  12/05/23 1700 129/68 97.6 F (36.4 C) -- (!) 57 98 %      PHYSICAL EXAM:  General: Alert, cooperative, no distress, appears stated age.  RS- b/l air entry Heart: Regular rate and rhythm, no murmur, rub or gallop. Abdomen: Soft, non-tender,not distended. Bowel sounds normal. No masses Extremities: left thigh dressing removed   Irregular shaped surgical wound with fat layer exposed- no purulence  Skin: No rashes or lesions. Or bruising Lymph: Cervical, supraclavicular normal. Neurologic: Grossly non-focal  Lab Results    Latest Ref Rng & Units 12/05/2023    5:00 AM 12/02/2023    6:38 AM 11/30/2023    6:35 AM  CBC  WBC 4.0 - 10.5 K/uL 4.4  5.5  4.2   Hemoglobin 12.0 - 15.0 g/dL 16.1  9.2  09.6   Hematocrit 36.0 - 46.0 % 32.1  30.3  32.1   Platelets 150 - 400 K/uL 403  330   234        Latest Ref Rng & Units 12/05/2023    5:00 AM 12/02/2023    6:38 AM 11/30/2023    6:35 AM  CMP  Glucose 70 - 99 mg/dL 92  93  83   BUN 8 - 23 mg/dL 9  7  <5   Creatinine 0.45 - 1.00 mg/dL 4.09  8.11  9.14   Sodium 135 - 145 mmol/L 139  138  138   Potassium 3.5 - 5.1 mmol/L 3.6  3.7  3.5   Chloride 98 - 111 mmol/L 108  106  105   CO2 22 - 32 mmol/L 23  23  26    Calcium  8.9 - 10.3 mg/dL 8.1  7.7  7.8       Microbiology: Wound culture staph aureus And ESBL ecoli and anerobes   Assessment/Plan: Purulent  cellulitis with abscess of the left upper thigh. S/P debridement On  linezolid  and  Zosyn  MRSA, ESBL ecoli and anerobes Will continue PO linezolid  and do IV ertapenem  1gram  for 1 week on discharge Will benefit from wound vac    Multiple myeloma status post stem cell transplantation in 2019 and recurrence now on  chemotherapy History of recurrent strep pneumoniae bacteremia and history of left knee septic arthritis   Anemia   Acquired hypothyroidism following thyroidectomy for Graves' On Synthroid    Discussed the management with the patient and surgeon and care   Will follow her as OP OPAT orders written

## 2023-12-08 ENCOUNTER — Inpatient Hospital Stay: Admitting: Infectious Diseases

## 2023-12-09 ENCOUNTER — Telehealth: Payer: Self-pay | Admitting: *Deleted

## 2023-12-09 NOTE — Telephone Encounter (Signed)
 Stephanie from [facility] and she is OT.  She is asking for her to get OT 1 time a week for 4 weeks.  She would like a call back [phone] and let their know if it is okay for the OT to be done.

## 2023-12-12 ENCOUNTER — Telehealth: Payer: Self-pay | Admitting: *Deleted

## 2023-12-12 ENCOUNTER — Other Ambulatory Visit: Payer: Self-pay | Admitting: Internal Medicine

## 2023-12-12 ENCOUNTER — Inpatient Hospital Stay

## 2023-12-12 ENCOUNTER — Encounter: Payer: Self-pay | Admitting: Internal Medicine

## 2023-12-12 ENCOUNTER — Inpatient Hospital Stay: Admitting: Internal Medicine

## 2023-12-12 NOTE — Progress Notes (Signed)
 Cycle number 7-day 8-chemo discontinued because of infection issues.  Will continue chemo holiday consider IVIG infusions to help infection. GB

## 2023-12-12 NOTE — Telephone Encounter (Signed)
 Asked if it is okay to leave a message for Brandi [name] and the family member says yes.  So when she gets done being seen in infectious disease tomorrow she needs to come over to the cancer center so that we can get the needle out and flush it with her heparin .  She needs to have more antibiotics she still needs to come over and will have to take the port needle out [facility] at [facility] and then put a new one into her as far as the needle itself.  She says she would tell Brandi [name] when she gets home.

## 2023-12-12 NOTE — Telephone Encounter (Signed)
 Brandi [name] is ok with the OT for the pt.

## 2023-12-12 NOTE — Telephone Encounter (Signed)
 Brandi [name] with [facility] has called and she wanted to know if we wanted to take the needle out because she is going to be taking her last antibiotic today.  I told her that if she is going to have the last 1 then yes as long as you have the heparin .  She says they did not have any.  I asked Brandi [name] how long the needle has been in.  She says over 7 days because she has been here 8 days.  Just said that the daughter said that it did get changed before they left from the hospital.  And Brandi [name] said that she would not be able to take it out because her last antibiotic is going to be later in the evening and they will be there, I sent a message to the infectious doctor send out the antibiotics and orders for all that.  She states that they do not have any thing for the port needle or saline or heparin  in their office.  I asked that they come over to the cancer center after they have been seen in Infectious Disease.  I also told Brandi [name] is it okay to add her on at 12:00.  She says she is going to talk to [facility] first.

## 2023-12-12 NOTE — Telephone Encounter (Signed)
 Patient was discharged from hospital with her port needle accessed so that IV antibiotics could be administered at home. Hopefully patient's last antibiotic will be Monday night (tonight). Needle has been accessed for 8 days now.  Pt will come by the Cancer Center tomorrow to have needle flushed and removed. Pt is seeing ID doctor prior to coming to [facility] tomorrow. If patient tells us  she is going to need to continue IV antibiotics then we need to de-acess current needle and put in a new needle with bio patch.

## 2023-12-12 NOTE — Telephone Encounter (Signed)
 Says she is taking all the medicines that she is given her and she kept talking about a pill that I did not see anything that was a oral pill for cancer it was just meds for the days she gets treatment and protonix  and pain pill.  Wanted to know if she could come and get her treatment while she is got that wound VAC .  I told her that I would send a message to Dr. Harlene Lie team about this

## 2023-12-13 ENCOUNTER — Ambulatory Visit: Attending: Infectious Diseases | Admitting: Infectious Diseases

## 2023-12-13 ENCOUNTER — Inpatient Hospital Stay

## 2023-12-13 ENCOUNTER — Encounter: Payer: Self-pay | Admitting: Infectious Diseases

## 2023-12-13 VITALS — BP 126/86 | HR 72 | Temp 97.9°F

## 2023-12-13 DIAGNOSIS — B9562 Methicillin resistant Staphylococcus aureus infection as the cause of diseases classified elsewhere: Secondary | ICD-10-CM | POA: Diagnosis not present

## 2023-12-13 DIAGNOSIS — E890 Postprocedural hypothyroidism: Secondary | ICD-10-CM | POA: Insufficient documentation

## 2023-12-13 DIAGNOSIS — L02416 Cutaneous abscess of left lower limb: Secondary | ICD-10-CM | POA: Diagnosis present

## 2023-12-13 DIAGNOSIS — A4902 Methicillin resistant Staphylococcus aureus infection, unspecified site: Secondary | ICD-10-CM

## 2023-12-13 DIAGNOSIS — Z792 Long term (current) use of antibiotics: Secondary | ICD-10-CM | POA: Insufficient documentation

## 2023-12-13 DIAGNOSIS — Z1612 Extended spectrum beta lactamase (ESBL) resistance: Secondary | ICD-10-CM | POA: Diagnosis not present

## 2023-12-13 DIAGNOSIS — C9002 Multiple myeloma in relapse: Secondary | ICD-10-CM | POA: Diagnosis not present

## 2023-12-13 DIAGNOSIS — B9620 Unspecified Escherichia coli [E. coli] as the cause of diseases classified elsewhere: Secondary | ICD-10-CM | POA: Diagnosis not present

## 2023-12-13 DIAGNOSIS — L03116 Cellulitis of left lower limb: Secondary | ICD-10-CM | POA: Insufficient documentation

## 2023-12-13 DIAGNOSIS — D649 Anemia, unspecified: Secondary | ICD-10-CM | POA: Diagnosis not present

## 2023-12-13 DIAGNOSIS — Z79890 Hormone replacement therapy (postmenopausal): Secondary | ICD-10-CM | POA: Diagnosis not present

## 2023-12-13 DIAGNOSIS — Z9484 Stem cells transplant status: Secondary | ICD-10-CM | POA: Insufficient documentation

## 2023-12-13 DIAGNOSIS — Z87891 Personal history of nicotine dependence: Secondary | ICD-10-CM | POA: Insufficient documentation

## 2023-12-13 DIAGNOSIS — L0390 Cellulitis, unspecified: Secondary | ICD-10-CM

## 2023-12-13 DIAGNOSIS — L0291 Cutaneous abscess, unspecified: Secondary | ICD-10-CM

## 2023-12-13 NOTE — Patient Instructions (Signed)
 You are here for the left thigh infeciton - had surgery- you have completed 2 weeks total antibiotic- we can stop it. Today we can get CBC with diff and cmp at oncology Follow up with Dr.Rodenberg

## 2023-12-13 NOTE — Progress Notes (Signed)
 NAME: Brandi [name]  DOB: January 28, 1951  MRN: [id number]  Date/Time: 12/13/2023 10:58 AM  Subjective:  Here for follow up Daughter with her  Was recently hospitalized between 5/2-5/13 for left thigh soft tissue infection due to MRSA and ESBL ecoli ? Brandi [name] is a 73 y.o. with a history of multiple myeloma versus stem cell transplant in 2019, COPD, strep pneumo bacteremia septic arthritis of the left knee in 2024 currently on salvage treatment with carfilzomib  and dexamethasone  which she last received on November 23, 2023, hypothyroidism secondary to total thyroidectomy for Graves' disease in 2022 presented to the hospital recently with painful boil in the left thigh which initially thought was due to secondary to spider bite.  She then underwent debridement on 12/01/2023 by Dr. Ofilia Benton.  The culture was positive for MRSA and ESBL E. coli and anaerobes.  She was sent home on IV ertapenem  and p.o. linezolid  to complete 1 week of treatment she did yesterday. She has a wound VAC on the left thigh As per the daughter the wound is doing very well She does not have any fever or chills Pain is much better Daughter was concerned that her boyfriend has a wound on his left buttock and could be the source for her MRSA infection.  Past Medical History:  Diagnosis Date   (HFpEF) heart failure with preserved ejection fraction (HCC)    a. 06/2020 Echo: EF 60-65%, no rwma, nl RV size/fxn. Triv MR. Triv TR/AI. Mod elev PASP.   Anxiety    COPD (chronic obstructive pulmonary disease) (HCC)    GERD (gastroesophageal reflux disease)    Hypertension    Hyperthyroidism    Hypokalemia    IgA myeloma (HCC)    Multiple myeloma (HCC)    Pneumonia    Red blood cell antibody positive, compatible PRBC difficult to obtain    S/P autologous bone marrow transplantation [facility])     Past Surgical History:  Procedure Laterality Date   ANTERIOR VITRECTOMY Left 10/07/2021   Procedure: ANTERIOR VITRECTOMY;  Surgeon:  Annell Kidney, MD;  Location: [facility];  Service: Ophthalmology;  Laterality: Left;   CATARACT EXTRACTION W/PHACO Left 10/07/2021   Procedure: CATARACT EXTRACTION PHACO AND INTRAOCULAR LENS PLACEMENT (IOC) LEFT VISION BLUE;  Surgeon: Annell Kidney, MD;  Location: [facility];  Service: Ophthalmology;  Laterality: Left;  kit for manual incision 14.56 01:21.4   CATARACT EXTRACTION W/PHACO Right 10/21/2021   Procedure: CATARACT EXTRACTION PHACO AND INTRAOCULAR LENS PLACEMENT (IOC) RIGHT 11.12 01:48.1;  Surgeon: Annell Kidney, MD;  Location: [facility];  Service: Ophthalmology;  Laterality: Right;   ESOPHAGOGASTRODUODENOSCOPY (EGD) WITH PROPOFOL  N/A 03/30/2019   Procedure: ESOPHAGOGASTRODUODENOSCOPY (EGD) WITH PROPOFOL ;  Surgeon: Luke Salaam, MD;  Location: [facility];  Service: Gastroenterology;  Laterality: N/A;   INCISION AND DRAINAGE ABSCESS Left 12/01/2023   Procedure: INCISION AND DRAINAGE, ABSCESS;  Surgeon: [name], MD;  Location: [facility] ORS;  Service: General;  Laterality: Left;   IR BONE MARROW BIOPSY & ASPIRATION  05/25/2023   IR FLUORO GUIDE PORT INSERTION RIGHT  12/16/2017   IR IMAGING GUIDED PORT INSERTION  06/15/2021   KNEE ARTHROSCOPY Left 09/03/2022   Procedure: ARTHROSCOPY KNEE DEBRIDEMENT;  Surgeon: Jerlyn Moons, MD;  Location: [facility] ORS;  Service: Orthopedics;  Laterality: Left;   THYROIDECTOMY N/A     Social History   Socioeconomic History   Marital status: Single    Spouse name: Not on file   Number of children: 3   Years of education:  Not on file   Highest education level: Not on file  Occupational History   Not on file  Tobacco Use   Smoking status: Former    Current packs/day: 0.00    Types: Cigarettes    Quit date: 03/18/2021    Years since quitting: 2.7   Smokeless tobacco: Never   Tobacco comments:    2 cigarettes QOD  Vaping Use   Vaping status: Never Used  Substance and Sexual Activity   Alcohol  use: No   Drug use: No   Sexual activity: Not Currently  Other Topics Concern   Not on file  Social History Narrative   Lives at home with family(son): daughter 8 blocks away. States "I'm never alone." Independent at baseline   Social Drivers of Corporate investment banker Strain: Not on file  Food Insecurity: No Food Insecurity (09/04/2022)   Hunger Vital Sign    Worried About Running Out of Food in the Last Year: Never true    Ran Out of Food in the Last Year: Never true  Transportation Needs: No Transportation Needs (09/04/2022)   PRAPARE - Administrator, Civil Service (Medical): No    Lack of Transportation (Non-Medical): No  Physical Activity: Not on file  Stress: Not on file  Social Connections: Not on file  Intimate Partner Violence: Not At Risk (11/26/2023)   Humiliation, Afraid, Rape, and Kick questionnaire    Fear of Current or Ex-Partner: No    Emotionally Abused: No    Physically Abused: No    Sexually Abused: No    Family History  Problem Relation Age of Onset   Pneumonia Mother    Seizures Father    No Known Allergies I? Current Outpatient Medications  Medication Sig Dispense Refill   albuterol  (VENTOLIN  HFA) 108 (90 Base) MCG/ACT inhaler Inhale 2 puffs into the lungs every 6 (six) hours as needed for wheezing or shortness of breath. 8 g 3   aspirin  EC 81 MG EC tablet Take 1 tablet (81 mg total) by mouth daily. 90 tablet 3   Brinzolamide -Brimonidine  (SIMBRINZA) 1-0.2 % SUSP Apply 1 drop to eye in the morning and at bedtime. I drop both eyes twice a day     calcium  carbonate (CALCIUM  600 HIGH POTENCY) 600 MG TABS tablet Take 2 tablets (1,200 mg total) by mouth 2 (two) times daily. 120 tablet 2   ertapenem  1 g in sodium chloride  0.9 % 100 mL Inject 1 g into the vein daily.     furosemide  (LASIX ) 40 MG tablet Take 1 tablet (40 mg total) by mouth daily as needed for edema or fluid.     levothyroxine  (SYNTHROID ) 175 MCG tablet Take 1 tablet (175 mcg total)  by mouth daily. For thyroid  90 tablet 1   lidocaine -prilocaine  (EMLA ) cream Apply 1 application topically as needed. Apply to port and cover with saran wrap 1-2 hours prior to port access 30 g 1   magnesium  oxide (MAG-OX) 400 (240 Mg) MG tablet Take 1 tablet (400 mg total) by mouth 2 (two) times daily. 60 tablet 3   montelukast  (SINGULAIR ) 10 MG tablet Take 1 tablet (10 mg total) by mouth at bedtime. 30 tablet 3   ondansetron  (ZOFRAN ) 8 MG tablet Take 1 tablet (8 mg total) by mouth every 8 (eight) hours as needed for nausea or vomiting. 30 tablet 0   Oxycodone  HCl 10 MG TABS Take 1 tablet (10 mg total) by mouth daily as needed (prior to dressing changes).  30 tablet 0   polyethylene glycol (MIRALAX  / GLYCOLAX ) packet Take 17 g by mouth daily as needed for mild constipation. 14 each 0   sodium chloride  flush (NS) 0.9 % SOLN 10-40 mLs by Intracatheter route as needed (flush). 300 mL 0   spironolactone  (ALDACTONE ) 25 MG tablet Take 1 tablet (25 mg total) by mouth daily as needed (edema).     traMADol  (ULTRAM ) 50 MG tablet Take 1 tablet (50 mg total) by mouth every 12 (twelve) hours as needed. (Patient taking differently: Take 50 mg by mouth every 12 (twelve) hours as needed for moderate pain (pain score 4-6).) 60 tablet 0   Vitamin D , Ergocalciferol , (DRISDOL ) 1.25 MG (50000 UNIT) CAPS capsule Take 1 capsule by mouth once a week 12 capsule 0   pantoprazole  (PROTONIX ) 40 MG tablet Take 1 tablet (40 mg total) by mouth daily. 90 tablet 0   No current facility-administered medications for this visit.     Abtx:  Anti-infectives (From admission, onward)    None       REVIEW OF SYSTEMS:  Const: negative fever, negative chills, negative weight loss Eyes: negative diplopia or visual changes, negative eye pain ENT: negative coryza, negative sore throat Resp: negative cough, hemoptysis, dyspnea Cards: negative for chest pain, palpitations, lower extremity edema GU: negative for frequency, dysuria and  hematuria GI: Negative for abdominal pain, diarrhea, bleeding, constipation Skin: negative for rash and pruritus Heme: negative for easy bruising and gum/nose bleeding MS: Left leg pain and swelling better Wound VAC Neurolo:negative for headaches, dizziness, vertigo, memory problems  Psych: negative for feelings of anxiety, depression  Endocrine: negative for thyroid , diabetes Allergy/Immunology- negative for any medication or food allergies  Objective:  VITALS:  BP 126/86   Pulse 72   Temp 97.9 F (36.6 C) (Temporal)   SpO2 95%   PHYSICAL EXAM:  General: Alert, cooperative, no distress, appears stated age.  Head: Normocephalic, without obvious abnormality, atraumatic. Eyes: Conjunctivae clear, anicteric sclerae. Pupils are equal ENT Nares normal. No drainage or sinus tenderness. Lips, mucosa, and tongue normal. No Thrush Neck: Supple, symmetrical, no adenopathy, thyroid : non tender no carotid bruit and no JVD. Back: No CVA tenderness. Lungs: Clear to auscultation bilaterally. No Wheezing or Rhonchi. No rales. Heart: Regular rate and rhythm, no murmur, rub or gallop. Abdomen: Soft, non-tender,not distended. Bowel sounds normal. No masses Extremities: Left thigh wound VAC not removed skin: No rashes or lesions. Or bruising Lymph: Cervical, supraclavicular normal. Neurologic: Grossly non-focal Pertinent Labs Lab Results CBC    Component Value Date/Time   WBC 4.4 12/05/2023 0500   RBC 3.61 (L) 12/05/2023 0500   HGB 10.1 (L) 12/05/2023 0500   HGB 10.7 (L) 11/22/2023 0910   HCT 32.1 (L) 12/05/2023 0500   PLT 403 (H) 12/05/2023 0500   PLT 110 (L) 11/22/2023 0910   MCV 88.9 12/05/2023 0500   MCH 28.0 12/05/2023 0500   MCHC 31.5 12/05/2023 0500   RDW 17.6 (H) 12/05/2023 0500   LYMPHSABS 1.3 12/05/2023 0500   MONOABS 0.2 12/05/2023 0500   EOSABS 0.1 12/05/2023 0500   BASOSABS 0.0 12/05/2023 0500       Latest Ref Rng & Units 12/05/2023    5:00 AM 12/02/2023    6:38 AM  11/30/2023    6:35 AM  CMP  Glucose 70 - 99 mg/dL 92  93  83   BUN 8 - 23 mg/dL 9  7  <5   Creatinine 4.09 - 1.00 mg/dL 8.11  9.14  7.82  Sodium 135 - 145 mmol/L 139  138  138   Potassium 3.5 - 5.1 mmol/L 3.6  3.7  3.5   Chloride 98 - 111 mmol/L 108  106  105   CO2 22 - 32 mmol/L 23  23  26    Calcium  8.9 - 10.3 mg/dL 8.1  7.7  7.8     ? Impression/Recommendation Purulent cellulitis with abscess of the left thigh.  Status post debridement On linezolid  and ertapenem  she completed yesterday total of 2 weeks She has a wound VAC She did ask her nurse to send a picture She does not need any further antibiotics  Multiple myeloma status post stem cell transplant in 2019 with recurrence hence her chemotherapy now.  The chemotherapy is on hold till the infection heals  History of recurrent strep pneumo bacteremia and history of left knee septic arthritis  Anemia  Acquired hypothyroidism following thyroidectomy for Graves' disease.  On Synthroid .  Discussed the management with the patient and her daughter She will follow-up with Dr. Ofilia Benton Also discussed with the patient on how to prevent MRSA infection and her partner has to get checked and need to follow infection prevention protocol  Patient is discharged from the clinic.  Will see her as needed  ________________________________________________  Note:  This document was prepared using Dragon voice recognition software and may include unintentional dictation errors.

## 2023-12-14 ENCOUNTER — Ambulatory Visit

## 2023-12-15 ENCOUNTER — Ambulatory Visit (INDEPENDENT_AMBULATORY_CARE_PROVIDER_SITE_OTHER): Admitting: Surgery

## 2023-12-15 ENCOUNTER — Telehealth: Payer: Self-pay | Admitting: *Deleted

## 2023-12-15 ENCOUNTER — Encounter: Payer: Self-pay | Admitting: Surgery

## 2023-12-15 VITALS — BP 129/83 | HR 69 | Temp 98.2°F | Ht 62.0 in | Wt 150.1 lb

## 2023-12-15 DIAGNOSIS — L0390 Cellulitis, unspecified: Secondary | ICD-10-CM

## 2023-12-15 DIAGNOSIS — L03116 Cellulitis of left lower limb: Secondary | ICD-10-CM | POA: Diagnosis not present

## 2023-12-15 DIAGNOSIS — L02416 Cutaneous abscess of left lower limb: Secondary | ICD-10-CM | POA: Diagnosis not present

## 2023-12-15 DIAGNOSIS — S71102A Unspecified open wound, left thigh, initial encounter: Secondary | ICD-10-CM | POA: Insufficient documentation

## 2023-12-15 DIAGNOSIS — S71102D Unspecified open wound, left thigh, subsequent encounter: Secondary | ICD-10-CM

## 2023-12-15 HISTORY — DX: Unspecified open wound, left thigh, initial encounter: S71.102A

## 2023-12-15 NOTE — Progress Notes (Signed)
 [facility] POST-OP OFFICE VISIT  12/15/2023  HPI: Brandi [name] is a 73 y.o. female had surgery on Dec 01, 2023, now s/p excisional debridement of left medial proximal thigh with history of necrotizing cellulitis.  Currently utilizing a wound VAC, with nurses coming out to change 3 times a week.  She denies pain with the exception of the removal of the adhesive.  She takes oxycodone  30 minutes prior to her dressing changes to help with this.  She is anxious to get back on her chemotherapy regimen.  However she currently denies any fevers, chills, nausea or vomiting.  She reports her bowel habits are regular.  And has been followed up by infectious disease.  Vital signs: BP 129/83   Pulse 69   Temp 98.2 F (36.8 C) (Oral)   Ht 5\' 2"  (1.575 m)   Wt 150 lb 1.6 oz (68.1 kg)   SpO2 98%   BMI 27.45 kg/m    Physical Exam: Constitutional: She appears well.  Skin: Treated with wound VAC change yesterday.  Patient reports it is progressing well.  Her daughter shows us  photos of her progress.  She does have bilateral lower extremity ankle edema which is symmetrical.  Assessment/Plan: This is a 73 y.o. female as above.  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   Abscess 11/30/2023   Multiple myeloma in relapse (HCC) 11/30/2023   Sepsis (HCC) 11/25/2023   Hypothyroidism 05/24/2021   COPD (chronic obstructive pulmonary disease) (HCC)    GERD (gastroesophageal reflux disease) 03/19/2021   Graves' disease 03/19/2021   Cellulitis and abscess of left lower extremity 10/10/2020   Chronic heart failure with preserved ejection fraction (HFpEF) (HCC) 12/14/2019   S/P autologous bone marrow transplantation (HCC) 06/29/2018   Essential hypertension 05/29/2018   Red blood cell antibody positive, compatible PRBC difficult to obtain 10/21/2017   Multiple myeloma  not having achieved remission (HCC) 04/08/2017    - Will continue negative pressure wound therapy until the wound has no sufficient depth warranted.  We discussed the dilemma between resuming her chemotherapy and slowing down her wound progress, and also making her vulnerable to recurrent infection.  We will navigate these challenging waters in the months to come.  I anticipate no sufficient change to warrant reevaluation in less than a month.  But will be glad to see her in the interval should this change.   [name] M.D., FACS 12/15/2023, 2:29 PM

## 2023-12-15 NOTE — Telephone Encounter (Signed)
 Lenon Radar had called over to see if the DME rollator is been signed and sent back.  I told her that I checked in the computer as well as with the team of Dr. Valentine Gasmen and they do not have the order for the rollator.  I called her and let her know and I gave her the fax #[phone].

## 2023-12-15 NOTE — Patient Instructions (Signed)
Please call the office if you have any questions or concerns. 

## 2023-12-16 ENCOUNTER — Other Ambulatory Visit: Payer: Self-pay

## 2023-12-20 ENCOUNTER — Other Ambulatory Visit

## 2023-12-20 ENCOUNTER — Ambulatory Visit: Admitting: Nurse Practitioner

## 2023-12-20 ENCOUNTER — Ambulatory Visit

## 2023-12-21 ENCOUNTER — Ambulatory Visit

## 2023-12-27 ENCOUNTER — Telehealth: Payer: Self-pay

## 2023-12-27 NOTE — Telephone Encounter (Signed)
 Received call from home health nurse. Patient's wound measures 7 cm x 2.3 cm x 0.2 cm deep. Need to discontinue wound vac due to shallowness of the wound. Therapy changed to wet to dry three times a week as needed. Will reschedule patient to be seen earlier.

## 2024-01-10 ENCOUNTER — Other Ambulatory Visit: Payer: Self-pay | Admitting: Internal Medicine

## 2024-01-12 ENCOUNTER — Encounter: Admitting: Surgery

## 2024-01-17 ENCOUNTER — Encounter: Admitting: Surgery

## 2024-01-18 ENCOUNTER — Other Ambulatory Visit: Payer: Self-pay

## 2024-01-31 ENCOUNTER — Encounter: Admitting: Surgery

## 2024-02-01 ENCOUNTER — Other Ambulatory Visit: Payer: Self-pay

## 2024-02-02 ENCOUNTER — Ambulatory Visit (INDEPENDENT_AMBULATORY_CARE_PROVIDER_SITE_OTHER): Admitting: Surgery

## 2024-02-02 ENCOUNTER — Encounter: Payer: Self-pay | Admitting: Surgery

## 2024-02-02 VITALS — BP 137/75 | HR 49 | Temp 98.6°F | Ht 62.0 in | Wt 151.2 lb

## 2024-02-02 DIAGNOSIS — S71102D Unspecified open wound, left thigh, subsequent encounter: Secondary | ICD-10-CM

## 2024-02-02 DIAGNOSIS — L02416 Cutaneous abscess of left lower limb: Secondary | ICD-10-CM | POA: Diagnosis not present

## 2024-02-02 DIAGNOSIS — Z09 Encounter for follow-up examination after completed treatment for conditions other than malignant neoplasm: Secondary | ICD-10-CM | POA: Diagnosis not present

## 2024-02-02 NOTE — Progress Notes (Signed)
 [facility] POST-OP OFFICE VISIT  02/02/2024  HPI: Brandi [name] is a 73 y.o. female had surgery on Dec 01, 2023, now s/p excisional debridement of left medial proximal thigh with history of necrotizing cellulitis.  She denies pain.  She is anxious to get back on her chemotherapy regimen.  She denies any fevers, chills, nausea or vomiting.  She reports her bowel habits are regular.  And has been followed up by infectious disease.  She has had no further drainage from her wound, however she still keeps it dressed.  Vital signs: BP 137/75   Pulse (!) 49   Temp 98.6 F (37 C) (Oral)   Ht 5' 2 (1.575 m)   Wt 151 lb 3.2 oz (68.6 kg)   SpO2 99%   BMI 27.65 kg/m    Physical Exam: Constitutional: She appears well.  She wears a protective patch over her left eye, for her recent history of retinal detachment.  Skin: Her proximal left thigh wound is now clean well-healed scar.  No need for any further wound care.  No contraindications for resuming her chemotherapy.  Assessment/Plan: This is a 73 y.o. female as above.  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   Abscess 11/30/2023   Multiple myeloma in relapse (HCC) 11/30/2023   Sepsis (HCC) 11/25/2023   Hypothyroidism 05/24/2021   COPD (chronic obstructive pulmonary disease) (HCC)    GERD (gastroesophageal reflux disease) 03/19/2021   Graves' disease 03/19/2021   Cellulitis and abscess of left lower extremity 10/10/2020   Chronic heart failure with preserved ejection fraction (HFpEF) (HCC) 12/14/2019   S/P autologous bone marrow transplantation (HCC) 06/29/2018   Essential hypertension 05/29/2018   Red blood cell antibody positive, compatible PRBC difficult to obtain 10/21/2017   Multiple myeloma not having achieved remission (HCC) 04/08/2017    - Left thigh  wound completely healed, no evidence of residual unhealed areas, inflammation or infection.  No contraindications to resuming chemotherapy as needed.  No further follow-up with me needed.  Glad to assist this pleasant lady in any way in the future should the need arise. I personally spent a total of 20 minutes in the care of the patient today including preparing to see the patient, getting/reviewing separately obtained history, performing a medically appropriate exam/evaluation, counseling and educating, and documenting clinical information in the EHR.   Honor Leghorn M.D., FACS 02/02/2024, 9:08 AM

## 2024-02-10 ENCOUNTER — Telehealth: Payer: Self-pay | Admitting: *Deleted

## 2024-02-10 NOTE — Telephone Encounter (Signed)
 Patient had a wound on the thigh had surgery and per Dr. Lane did the surgery and his last message says Her proximal left thigh wound is now clean well-healed scar.  The patient wants to get back on treatment since her wound is all healed up

## 2024-02-13 ENCOUNTER — Other Ambulatory Visit: Payer: Self-pay | Admitting: *Deleted

## 2024-02-13 ENCOUNTER — Other Ambulatory Visit: Payer: Self-pay | Admitting: Internal Medicine

## 2024-02-13 DIAGNOSIS — C9000 Multiple myeloma not having achieved remission: Secondary | ICD-10-CM

## 2024-02-13 NOTE — Progress Notes (Signed)
 Start on 7/28- # [facility] CBC CMP- quantitative immunoglobulins-multiple myeloma panel kappa lambda light chain ratio Chemo- day-1, 2 # [facility] CBC CMP-  Chemo- day-8,9 # [facility] CBC CMP-  Chemo- day-15, 16  # multiple myeloma panel kappa lambda light chain ratio q 4 weeks.   GB

## 2024-02-14 ENCOUNTER — Other Ambulatory Visit: Payer: Self-pay

## 2024-02-15 ENCOUNTER — Other Ambulatory Visit: Payer: Self-pay

## 2024-02-16 ENCOUNTER — Encounter: Payer: Self-pay | Admitting: Internal Medicine

## 2024-02-20 ENCOUNTER — Encounter: Payer: Self-pay | Admitting: Internal Medicine

## 2024-02-20 ENCOUNTER — Inpatient Hospital Stay: Attending: Internal Medicine

## 2024-02-20 ENCOUNTER — Inpatient Hospital Stay (HOSPITAL_BASED_OUTPATIENT_CLINIC_OR_DEPARTMENT_OTHER): Admitting: Internal Medicine

## 2024-02-20 ENCOUNTER — Inpatient Hospital Stay

## 2024-02-20 VITALS — BP 159/62 | HR 50 | Temp 98.9°F | Resp 20 | Ht 62.0 in | Wt 155.6 lb

## 2024-02-20 VITALS — BP 108/89 | HR 50

## 2024-02-20 DIAGNOSIS — C9000 Multiple myeloma not having achieved remission: Secondary | ICD-10-CM | POA: Insufficient documentation

## 2024-02-20 DIAGNOSIS — Z79620 Long term (current) use of immunosuppressive biologic: Secondary | ICD-10-CM | POA: Insufficient documentation

## 2024-02-20 DIAGNOSIS — Z5112 Encounter for antineoplastic immunotherapy: Secondary | ICD-10-CM | POA: Insufficient documentation

## 2024-02-20 LAB — CBC WITH DIFFERENTIAL (CANCER CENTER ONLY)
Abs Immature Granulocytes: 0.01 K/uL (ref 0.00–0.07)
Basophils Absolute: 0 K/uL (ref 0.0–0.1)
Basophils Relative: 1 %
Eosinophils Absolute: 0.1 K/uL (ref 0.0–0.5)
Eosinophils Relative: 2 %
HCT: 33.9 % — ABNORMAL LOW (ref 36.0–46.0)
Hemoglobin: 11.1 g/dL — ABNORMAL LOW (ref 12.0–15.0)
Immature Granulocytes: 0 %
Lymphocytes Relative: 51 %
Lymphs Abs: 2.7 K/uL (ref 0.7–4.0)
MCH: 29.8 pg (ref 26.0–34.0)
MCHC: 32.7 g/dL (ref 30.0–36.0)
MCV: 91.1 fL (ref 80.0–100.0)
Monocytes Absolute: 0.3 K/uL (ref 0.1–1.0)
Monocytes Relative: 6 %
Neutro Abs: 2.1 K/uL (ref 1.7–7.7)
Neutrophils Relative %: 40 %
Platelet Count: 186 K/uL (ref 150–400)
RBC: 3.72 MIL/uL — ABNORMAL LOW (ref 3.87–5.11)
RDW: 13.3 % (ref 11.5–15.5)
Smear Review: NORMAL
WBC Count: 5.2 K/uL (ref 4.0–10.5)
nRBC: 0 % (ref 0.0–0.2)

## 2024-02-20 LAB — CMP (CANCER CENTER ONLY)
ALT: 11 U/L (ref 0–44)
AST: 16 U/L (ref 15–41)
Albumin: 3.8 g/dL (ref 3.5–5.0)
Alkaline Phosphatase: 66 U/L (ref 38–126)
Anion gap: 9 (ref 5–15)
BUN: 14 mg/dL (ref 8–23)
CO2: 22 mmol/L (ref 22–32)
Calcium: 8.1 mg/dL — ABNORMAL LOW (ref 8.9–10.3)
Chloride: 109 mmol/L (ref 98–111)
Creatinine: 0.6 mg/dL (ref 0.44–1.00)
GFR, Estimated: >60 mL/min (ref >60)
Glucose, Bld: 95 mg/dL (ref 70–99)
Potassium: 3.4 mmol/L — ABNORMAL LOW (ref 3.5–5.1)
Sodium: 140 mmol/L (ref 135–145)
Total Bilirubin: 0.5 mg/dL (ref 0.0–1.2)
Total Protein: 6.4 g/dL — ABNORMAL LOW (ref 6.5–8.1)

## 2024-02-20 MED ORDER — DEXAMETHASONE SODIUM PHOSPHATE 10 MG/ML IJ SOLN
5.0000 mg | Freq: Once | INTRAMUSCULAR | Status: AC
  Administered 2024-02-20: 5 mg via INTRAVENOUS
  Filled 2024-02-20: qty 1

## 2024-02-20 MED ORDER — SPIRONOLACTONE 25 MG PO TABS: 25.0000 mg | ORAL_TABLET | Freq: Every day | ORAL | 1 refills | Status: DC | PRN

## 2024-02-20 MED ORDER — TRAMADOL HCL 50 MG PO TABS: 50.0000 mg | ORAL_TABLET | Freq: Two times a day (BID) | ORAL | 0 refills | Status: DC | PRN

## 2024-02-20 MED ORDER — SODIUM CHLORIDE 0.9 % IV SOLN
Freq: Once | INTRAVENOUS | Status: AC
Start: 2024-02-20 — End: 2024-02-20
  Filled 2024-02-20: qty 250

## 2024-02-20 MED ORDER — DIPHENHYDRAMINE HCL 50 MG/ML IJ SOLN
50.0000 mg | Freq: Once | INTRAMUSCULAR | Status: AC
  Administered 2024-02-20: 50 mg via INTRAVENOUS
  Filled 2024-02-20: qty 1

## 2024-02-20 MED ORDER — SODIUM CHLORIDE 0.9 % IV SOLN: 10.0000 mg/kg | Freq: Once | INTRAVENOUS | Status: DC

## 2024-02-20 MED ORDER — HEPARIN SOD (PORK) LOCK FLUSH 100 UNIT/ML IV SOLN
500.0000 [IU] | Freq: Once | INTRAVENOUS | Status: AC | PRN
Start: 2024-02-20 — End: 2024-02-20
  Administered 2024-02-20: 500 [IU]
  Filled 2024-02-20: qty 5

## 2024-02-20 MED ORDER — SODIUM CHLORIDE 0.9 % IV SOLN
10.0000 mg/kg | Freq: Once | INTRAVENOUS | Status: AC
  Administered 2024-02-20: 700 mg via INTRAVENOUS
  Filled 2024-02-20: qty 25

## 2024-02-20 MED ORDER — ACETAMINOPHEN 325 MG PO TABS
650.0000 mg | ORAL_TABLET | Freq: Once | ORAL | Status: AC
  Administered 2024-02-20: 650 mg via ORAL
  Filled 2024-02-20: qty 2

## 2024-02-20 MED ORDER — ACYCLOVIR 400 MG PO TABS: 400.0000 mg | ORAL_TABLET | Freq: Two times a day (BID) | ORAL | 2 refills | Status: AC

## 2024-02-20 MED ORDER — DEXTROSE 5 % IV SOLN
56.0000 mg/m2 | Freq: Once | INTRAVENOUS | Status: AC
  Administered 2024-02-20: 100 mg via INTRAVENOUS
  Filled 2024-02-20: qty 30

## 2024-02-20 MED ORDER — FAMOTIDINE IN NACL 20-0.9 MG/50ML-% IV SOLN
20.0000 mg | Freq: Once | INTRAVENOUS | Status: AC
  Administered 2024-02-20: 20 mg via INTRAVENOUS
  Filled 2024-02-20: qty 50

## 2024-02-20 NOTE — Progress Notes (Signed)
 Patient has no concerns

## 2024-02-20 NOTE — Progress Notes (Signed)
 [facility] OFFICE PROGRESS NOTE  Patient Care Team: Buren Rock HERO, MD as PCP - General (Family Medicine) End, Lonni, MD as PCP - Cardiology (Cardiology) Guinevere File, [name] Rennie Cindy SAUNDERS, MD as Consulting Physician (Oncology) Parris Manna, MD as Consulting Physician (Pulmonary Disease) Damian Therisa HERO, MD as Consulting Physician (Endocrinology)   Cancer Staging  Multiple myeloma not having achieved remission [facility]) Staging form: Plasma Cell Myeloma and Plasma Cell Disorders, AJCC 8th Edition - Clinical: No stage assigned - Unsigned - Clinical: No stage assigned - Unsigned   Oncology History Overview Note  # SEP 2018- MULTIPLE MYELOMA IgALamda [2.5 gm/dl; K/L= 05/8700]; STAGE III [beta 2 microglobulin=5.5] [presented with acute renal failure; anemia; NO hypercalcemia; Skeletal survey-Normal]; BMBx- 45% plasma cells; FISH-POSITIVE 11:14 translocation.[STANDARD-high RISK]/cyto-Normal; SEP 2018- PET- L3 posterior element lesion.   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 No morphologic support for residual myeloma disease. Negative for minimal residual disease by MM-MRD flow cytometry; HOLD REVLIMID  [leg swelling]; AUG 2021-PET scan negative for myeloma; continue to hold Revlimid   # MARCH 2021-diastolic congestive heart failure [Dr.End]  # BMBx- OCT 2021- BMBx- 5% plasmacytosis-however this appears to be more polyclonal  rather than monoclonal-not explain patient worsening anemia.   # OCT 2022- 18th- Dara- Rev-Dex; MARCH, 2024- PROGRESSION bone marrow biopsy 70% plasma cells; PET scan no bone lesions-however conus medullaris uptake-MRI lumbar spine-   # MAY 2024- DISCONTINUE carfilzomib -dexamethasone - Pomalyst ; [rising M-protein; MAY 2024- 1.3. ]   # start 12/27/2022 -Isa-Carfil-Dex;  [decrease dexamethasone  20 weekly- starting 05/04/23- sec to glaucoma].   # OCT 24th, 2024- Bone marrow Biopsy: The findings are consistent with  focal minimal residual lambda restricted plasma cell neoplasm- HOLD treatment sec to glaucoma; and drop in EF AUG 2024- 45-50% [March 2024- 50-55%]  # MARCH 16th, 2025- re-started Isa-Carfil-Dex;  #November 2021-hyperthyroidism/goiter- [D.Bennett/Dr.Solum]-methimazole . S/p Thyroidectomy [Dr.Kim; [facility]-AUG 2022]  --------------------------  # 12/12- RIGHT JUGULAR DVT-x 71m on xarelto ; finished April 2020; September 2020-EGD/dysphagia; Dr. Therisa  # Acute renal failure [Dr.Singh; Proteinuria 1.5gm/day ]; [name] ------------------------------------------------------------------------------------------------------------   DIAGNOSIS: [ ]  MULTIPLE MYELOMA  STAGE: III/HIGH RISK ;GOALS: CONTROL      Multiple myeloma not having achieved remission (HCC)  11/21/2017 - 04/28/2018 Chemotherapy   Patient is on Treatment Plan : MYELOMA SALVAGE Cyclophosphamide  / Carfilzomib  / Dexamethasone  (CCd) q28d     05/12/2021 - 02/25/2022 Chemotherapy   Patient is on Treatment Plan : MYELOMA RELAPSED REFRACTORY Daratumumab  SQ + Lenalidomide  + Dexamethasone  (DaraRd) q28d     05/12/2021 - 07/23/2022 Chemotherapy   Patient is on Treatment Plan : MYELOMA RELAPSED REFRACTORY Daratumumab  SQ + Lenalidomide  + Dexamethasone  (DaraRd) q28d     11/02/2022 - 11/16/2022 Chemotherapy   Patient is on Treatment Plan : MYELOMA RELAPSED/ REFRACTORY Carfilzomib  D1,8,15 (20/27) + Pomalidomide  + Dexamethasone  (KPd) q28d      12/27/2022 -  Chemotherapy   Patient is on Treatment Plan : MYELOMA Carfilzomib  + Dexamethasone  + Isatuximab  q28d      INTERVAL HISTORY: Ambulating  independently.   Alone.  Brandi [name] 73 y.o.  female pleasant patient above history of relapsed multiple myeloma--most recently on  Isa- Carfil-Dex  is here for a follow up.  Patient's chemotherapy was interrupted-because of thigh infection needing wound VAC.  Last treatment was approximately 3 months ago in  May 2025.  No fevers or chills. No infections. Denies any worsening pain.  Patient taking tramadol  as needed for upper and leg cramps. Mild tingling in feet.  No   Review of Systems  Constitutional:  Positive for malaise/fatigue. Negative for chills, diaphoresis and fever.  HENT:  Negative for nosebleeds and sore throat.   Eyes:  Negative for double vision.  Respiratory:  Negative for hemoptysis.   Cardiovascular:  Negative for chest pain, palpitations and orthopnea.  Gastrointestinal:  Negative for abdominal pain, blood in stool, constipation, heartburn and melena.  Genitourinary:  Negative for dysuria, frequency and urgency.  Musculoskeletal:  Positive for back pain.  Skin: Negative.  Negative for itching and rash.  Neurological:  Negative for dizziness, tingling, focal weakness, weakness and headaches.  Endo/Heme/Allergies:  Does not bruise/bleed easily.  Psychiatric/Behavioral:  Negative for depression. The patient is not nervous/anxious.     PAST MEDICAL HISTORY :  Past Medical History:  Diagnosis Date   (HFpEF) heart failure with preserved ejection fraction (HCC)    a. 06/2020 Echo: EF 60-65%, no rwma, nl RV size/fxn. Triv MR. Triv TR/AI. Mod elev PASP.   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   Open wound of left thigh 12/15/2023   Pneumonia    Red blood cell antibody positive, compatible PRBC difficult to obtain    S/P autologous bone marrow transplantation (HCC)    Sepsis (HCC) 11/25/2023    PAST SURGICAL HISTORY :   Past Surgical History:  Procedure Laterality Date   ANTERIOR VITRECTOMY Left 10/07/2021   Procedure: ANTERIOR VITRECTOMY;  Surgeon: Mittie Gaskin, MD;  Location: [facility];  Service: Ophthalmology;  Laterality: Left;   CATARACT EXTRACTION W/PHACO Left 10/07/2021   Procedure: CATARACT EXTRACTION PHACO AND INTRAOCULAR LENS PLACEMENT (IOC) LEFT VISION BLUE;  Surgeon: Mittie Gaskin, MD;  Location: [facility];  Service: Ophthalmology;  Laterality: Left;  kit for manual incision 14.56 01:21.4   CATARACT EXTRACTION W/PHACO Right 10/21/2021   Procedure: CATARACT EXTRACTION PHACO AND INTRAOCULAR LENS PLACEMENT (IOC) RIGHT 11.12 01:48.1;  Surgeon: Mittie Gaskin, MD;  Location: [facility];  Service: Ophthalmology;  Laterality: Right;   ESOPHAGOGASTRODUODENOSCOPY (EGD) WITH PROPOFOL  N/A 03/30/2019   Procedure: ESOPHAGOGASTRODUODENOSCOPY (EGD) WITH PROPOFOL ;  Surgeon: Therisa Bi, MD;  Location: [facility];  Service: Gastroenterology;  Laterality: N/A;   INCISION AND DRAINAGE ABSCESS Left 12/01/2023   Procedure: INCISION AND DRAINAGE, ABSCESS;  Surgeon: Lane Shope, MD;  Location: [facility] ORS;  Service: General;  Laterality: Left;   IR BONE MARROW BIOPSY & ASPIRATION  05/25/2023   IR FLUORO GUIDE PORT INSERTION RIGHT  12/16/2017   IR IMAGING GUIDED PORT INSERTION  06/15/2021   KNEE ARTHROSCOPY Left 09/03/2022   Procedure: ARTHROSCOPY KNEE DEBRIDEMENT;  Surgeon: Leora Lynwood SAUNDERS, MD;  Location: [facility] ORS;  Service: Orthopedics;  Laterality: Left;   THYROIDECTOMY N/A     FAMILY HISTORY :   Family History  Problem Relation  Age of Onset   Pneumonia Mother    Seizures  Father     SOCIAL HISTORY:   Social History   Tobacco Use   Smoking status: Former    Current packs/day: 0.00    Types: Cigarettes    Quit date: 03/18/2021    Years since quitting: 2.9   Smokeless tobacco: Never   Tobacco comments:    2 cigarettes QOD  Vaping Use   Vaping status: Never Used  Substance Use Topics   Alcohol use: No   Drug use: No    ALLERGIES:  has no known allergies.  MEDICATIONS:  Current Outpatient Medications  Medication Sig Dispense Refill   acyclovir  (ZOVIRAX ) 400 MG tablet Take 1 tablet (400 mg total) by mouth 2 (two) times daily. 120 tablet 2   albuterol  (VENTOLIN  HFA) 108 (90 Base) MCG/ACT inhaler Inhale 2 puffs into the lungs every 6 (six) hours as needed for wheezing or shortness of breath. 8 g 3   aspirin  EC 81 MG EC tablet Take 1 tablet (81 mg total) by mouth daily. 90 tablet 3   Brinzolamide -Brimonidine  (SIMBRINZA) 1-0.2 % SUSP Apply 1 drop to eye in the morning and at bedtime. I drop both eyes twice a day     calcium  carbonate (CALCIUM  600 HIGH POTENCY) 600 MG TABS tablet Take 2 tablets (1,200 mg total) by mouth 2 (two) times daily. 120 tablet 2   ertapenem  1 g in sodium chloride  0.9 % 100 mL Inject 1 g into the vein daily.     furosemide  (LASIX ) 40 MG tablet Take 1 tablet (40 mg total) by mouth daily as needed for edema or fluid.     levothyroxine  (SYNTHROID ) 175 MCG tablet Take 1 tablet (175 mcg total) by mouth daily. For thyroid  90 tablet 1   lidocaine -prilocaine  (EMLA ) cream Apply 1 application topically as needed. Apply to port and cover with saran wrap 1-2 hours prior to port access 30 g 1   magnesium  oxide (MAG-OX) 400 (240 Mg) MG tablet Take 1 tablet (400 mg total) by mouth 2 (two) times daily. 60 tablet 3   montelukast  (SINGULAIR ) 10 MG tablet Take 1 tablet (10 mg total) by mouth at bedtime. 30 tablet 3   pantoprazole  (PROTONIX ) 40 MG tablet Take 1 tablet (40 mg total) by mouth daily. 90 tablet 0   polyethylene glycol (MIRALAX  / GLYCOLAX )  packet Take 17 g by mouth daily as needed for mild constipation. 14 each 0   spironolactone  (ALDACTONE ) 25 MG tablet Take 1 tablet (25 mg total) by mouth daily as needed (edema). 30 tablet 1   traMADol  (ULTRAM ) 50 MG tablet Take 1 tablet (50 mg total) by mouth every 12 (twelve) hours as needed. 60 tablet 0   Vitamin D , Ergocalciferol , (DRISDOL ) 1.25 MG (50000 UNIT) CAPS capsule Take 1 capsule by mouth once a week 12 capsule 0   No current facility-administered medications for this visit.   Facility-Administered Medications Ordered in Other Visits  Medication Dose Route Frequency Provider Last Rate Last Admin   carfilzomib  (KYPROLIS ) 100 mg in dextrose  5 % 100 mL chemo infusion  56 mg/m2 (Treatment Plan Recorded) Intravenous Once Bryna Razavi R, MD       heparin  lock flush 100 unit/mL  500 Units Intracatheter Once PRN Cabell Lazenby R, MD        PHYSICAL EXAMINATION:   BP (!) 159/62 (BP Location: Left Arm, Patient Position: Sitting) Comment: re check  Pulse (!) 50   Temp 98.9 F (37.2 C)  Resp 20   Ht 5' 2 (1.575 m)   Wt 155 lb 9.6 oz (70.6 kg)   SpO2 100%   BMI 28.46 kg/m   Filed Weights   02/20/24 0834  Weight: 155 lb 9.6 oz (70.6 kg)         Mild leg swelling.   Physical Exam HENT:     Head: Normocephalic and atraumatic.  Eyes:     Pupils: Pupils are equal, round, and reactive to light.  Cardiovascular:     Rate and Rhythm: Normal rate and regular rhythm.  Pulmonary:     Effort: Pulmonary effort is normal. No respiratory distress.     Breath sounds: Normal breath sounds. No wheezing.  Abdominal:     General: Bowel sounds are normal. There is no distension.     Palpations: Abdomen is soft. There is no mass.     Tenderness: There is no abdominal tenderness. There is no guarding or rebound.  Musculoskeletal:        General: No tenderness. Normal range of motion.     Cervical back: Normal range of motion and neck supple.  Skin:    General: Skin is  warm.  Neurological:     Mental Status: She is alert and oriented to person, place, and time.  Psychiatric:        Mood and Affect: Affect normal.    LABORATORY DATA:  I have reviewed the data as listed    Component Value Date/Time   NA 140 02/20/2024 0818   NA 141 01/25/2020 1530   K 3.4 (L) 02/20/2024 0818   CL 109 02/20/2024 0818   CO2 22 02/20/2024 0818   GLUCOSE 95 02/20/2024 0818   BUN 14 02/20/2024 0818   BUN 19 01/25/2020 1530   CREATININE 0.60 02/20/2024 0818   CALCIUM  8.1 (L) 02/20/2024 0818   PROT 6.4 (L) 02/20/2024 0818   ALBUMIN 3.8 02/20/2024 0818   AST 16 02/20/2024 0818   ALT 11 02/20/2024 0818   ALKPHOS 66 02/20/2024 0818   BILITOT 0.5 02/20/2024 0818   GFRNONAA >60 02/20/2024 0818   GFRAA NOT CALCULATED 04/17/2020 1352    No results found for: SPEP, UPEP  Lab Results  Component Value Date   WBC 5.2 02/20/2024   NEUTROABS 2.1 02/20/2024   HGB 11.1 (L) 02/20/2024   HCT 33.9 (L) 02/20/2024   MCV 91.1 02/20/2024   PLT 186 02/20/2024      Chemistry      Component Value Date/Time   NA 140 02/20/2024 0818   NA 141 01/25/2020 1530   K 3.4 (L) 02/20/2024 0818   CL 109 02/20/2024 0818   CO2 22 02/20/2024 0818   BUN 14 02/20/2024 0818   BUN 19 01/25/2020 1530   CREATININE 0.60 02/20/2024 0818      Component Value Date/Time   CALCIUM  8.1 (L) 02/20/2024 0818   ALKPHOS 66 02/20/2024 0818   AST 16 02/20/2024 0818   ALT 11 02/20/2024 0818   BILITOT 0.5 02/20/2024 0818      (H): Data is abnormally high   Latest Reference Range & Units Most Recent 12/24/20 14:01 04/01/21 14:34 04/28/21 09:17 06/11/21 09:42 07/03/21 10:32 08/20/21 10:06  M Protein SerPl Elph-Mcnc Not Observed g/dL 1.1 (H) (C) 8/73/76 89:93 Not Observed (C) 0.6 (H) (C) 0.7 (H) (C) 0.5 (H) (C) 0.7 (H) (C) 1.1 (H) (C)    Latest Reference Range & Units Most Recent 09/17/21 08:47 10/15/21 08:25 11/12/21 08:55  Kappa free light chain 3.3 -  19.4 mg/L 5.0 11/12/21 08:55 7.2 7.2 5.0   Lambda free light chains 5.7 - 26.3 mg/L 223.6 (H) 11/12/21 08:55 215.3 (H) 340.2 (H) 223.6 (H)  Kappa, lambda light chain ratio 0.26 - 1.65  0.02 (L) 11/12/21 08:55 0.03 (L) 0.02 (L) 0.02 (L)  (H): Data is abnormally high (L): Data is abnormally low  RADIOGRAPHIC STUDIES: I have personally reviewed the radiological images as listed and agreed with the findings in the report. No results found.   ASSESSMENT & PLAN:  Multiple myeloma not having achieved remission (HCC) # RECURRENT Multiple myeloma stage III [high-risk cytogenetics-status post KRD-VGPR ; status post autologous stem cell transplant on 06/15/18.   MAY 2024- 1.3. ]  currently on Isa-Dex; d-2 carfil weekly q 28 days. # MARCH 2025- M-protein -0.3 ; kappa lambda light chain ratio-1.8 lambda light chain 63-slowly RISING FEB 2025- Ejection fraction improved. However chemotherapy was interrupted-because of thigh infection needing wound VAC.  Last treatment was approximately 3 months ago in  May 2025.  # S/p  re-evaluation at [facility] in June 2025- Labs c/w early progression. No evidence of end organ damage,  recommend continuing current therapy for now with the plan of repeating PET/CT within the next  months to r/o new osseous lesions. Based on PET/CT results will determine if patient needs to change therapy.  # For now proceed with with Isa-Dex-Carfil- Labs-CBC/chemistries were reviewed with the patient.    # DROP in EF:  MARCH 2024-Echo [[facility]] Left ventricular EF 50 to 55%; AUG 13th/OCT 23rd, 2024- EF-45-50%.  FEB 26th, 2025- Left ventricular ejection fraction, by estimation, is 50 to 55%. The left ventricle has low normal function Recommend monitoring for now. Stable.   # Anemia-Hb 9-10- JAn 2025- I ron sat- 8%; ferritin- 19 on prn venofer - Stable.   # Electrolyte: Hypokalemia: mild-  continue compliance Kdur TID-; hypocalcemia- 8.0- vit D SEP 2024- 50- continie Ergo 50k weekly. continue ca BID    sep 2024- Vit D 54. Stable.   #  Iatrogenic hypothyroidism [ Graves/goiter s/p total thyroidectomy;aug,2022 ]-currently on Synthroid  150 mcg/day-[increased in AUG, 2024-  Stable.   # MAY 2025- thigh infection s/p wound VAC [Dr.Rodenberg] - improved- Quan Ig- 500.   # Glaucoma BIL- L > R [Dr.King; [facility]] - ? Steroid induced on eye drops. decreased the steroids.  Discussed with ophthalmology- monitor for now. stable  # Lower back discomfort x 6 months-see above.  MRI lumbar spine as above.  Continue tramadol - refilled . stable  # Bone lesions /last zometa  on 11/27/2017.  S/p   Dental extraction PET scan evidence of any bone lesions.- consider Zometa   at next visit.   # Infection prophylaxis: Acyclovir ; asprin- add IVIG infusion [IgG < 400]- plan  IVIG- q 4 w #1 on- 9/17- Stable.   # IV access: mediport- functional  # ACP: Patient has  incurable cancer.  However patient is currently doing well. Patient interested in continue current scope of therapy; and full code.  # Vaccination: Flu shot today [sep 2024] s/p Pneumonia vaccination [march 2024]- stable.  Recommend RSV vaccination.   6 w- given dental extrac- on 1/26. :MM panel; K/l light chain ratio q 4 W  D-isa-dex;car1&2-car; weekly x 3; week 4-OFF;  q 28 day cycle; NO zometa - dental issues-ok; but hypocalemia  PS:  # DISPOSITION: # order standing orders for 4 weeks- MM panel/k-l light chains- # weekly port-cbc/cmp # chemo today;  and tomorrow.  # follow up as per IS-  Dr.B  Orders Placed This Encounter  Procedures   Multiple Myeloma Panel (SPEP&IFE w/QIG)    Standing Status:   Standing    Number of Occurrences:   15    Expiration Date:   02/19/2025   Kappa/lambda light chains    Standing Status:   Standing    Number of Occurrences:   15    Expiration Date:   02/19/2025   All questions were answered. The patient knows to call the clinic with any problems, questions or concerns.      Cindy JONELLE Joe, MD 02/20/2024 11:37 AM End

## 2024-02-20 NOTE — Progress Notes (Signed)
 Upper thigh wound completely healed. Has PCP appt with Dr Buren on Aug 8th.

## 2024-02-20 NOTE — Progress Notes (Signed)
 Dose change to 700mg  based on 10mg /kg ok per Dr Rennie

## 2024-02-20 NOTE — Assessment & Plan Note (Addendum)
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   MAY 2024- 1.3. ]  currently on Isa-Dex; d-2 carfil weekly q 28 days. # MARCH 2025- M-protein -0.3 ; kappa lambda light chain ratio-1.8 lambda light chain 63-slowly RISING FEB 2025- Ejection fraction improved. However chemotherapy was interrupted-because of thigh infection needing wound VAC.  Last treatment was approximately 3 months ago in  May 2025.  # S/p  re-evaluation at [facility] in June 2025- Labs c/w early progression. No evidence of end organ damage,  recommend continuing current therapy for now with the plan of repeating PET/CT within the next  months to r/o new osseous lesions. Based on PET/CT results will determine if patient needs to change therapy.  # For now proceed with with Isa-Dex-Carfil- Labs-CBC/chemistries were reviewed with the patient.    # DROP in EF:  MARCH 2024-Echo [[facility]] Left ventricular EF 50 to 55%; AUG 13th/OCT 23rd, 2024- EF-45-50%.  FEB 26th, 2025- Left ventricular ejection fraction, by estimation, is 50 to 55%. The left ventricle has low normal function Recommend monitoring for now. Stable.   # Anemia-Hb 9-10- JAn 2025- I ron sat- 8%; ferritin- 19 on prn venofer - Stable.   # Electrolyte: Hypokalemia: mild-  continue compliance Kdur TID-; hypocalcemia- 8.0- vit D SEP 2024- 50- continie Ergo 50k weekly. continue ca BID    sep 2024- Vit D 54. Stable.   # Iatrogenic hypothyroidism [ Graves/goiter s/p total thyroidectomy;aug,2022 ]-currently on Synthroid  150 mcg/day-[increased in AUG, 2024-  Stable.   # MAY 2025- thigh infection s/p wound VAC [Dr.Rodenberg] - improved- Quan Ig- 500.   # Glaucoma BIL- L > R [Dr.King;  eye] - ? Steroid induced on eye drops. decreased the steroids.  Discussed with ophthalmology- monitor for now. stable  # Lower back discomfort x 6 months-see above.  MRI lumbar spine as above.  Continue  tramadol - refilled . stable  # Bone lesions /last zometa  on 11/27/2017.  S/p   Dental extraction PET scan evidence of any bone lesions.- consider Zometa   at next visit.   # Infection prophylaxis: Acyclovir ; asprin- add IVIG infusion [IgG < 400]- plan  IVIG- q 4 w #1 on- 9/17- Stable.   # IV access: mediport- functional  # ACP: Patient has  incurable cancer.  However patient is currently doing well. Patient interested in continue current scope of therapy; and full code.  # Vaccination: Flu shot today [sep 2024] s/p Pneumonia vaccination [march 2024]- stable.  Recommend RSV vaccination.   6 w- given dental extrac- on 1/26. :MM panel; K/l light chain ratio q 4 W  D-isa-dex;car1&2-car; weekly x 3; week 4-OFF;  q 28 day cycle; NO zometa - dental issues-ok; but hypocalemia  PS:  # DISPOSITION: # order standing orders for 4 weeks- MM panel/k-l light chains- # weekly port-cbc/cmp # chemo today;  and tomorrow.  # follow up as per IS-  Dr.B

## 2024-02-21 ENCOUNTER — Inpatient Hospital Stay

## 2024-02-21 VITALS — BP 137/74 | HR 49 | Temp 98.2°F | Resp 16

## 2024-02-21 DIAGNOSIS — Z5112 Encounter for antineoplastic immunotherapy: Secondary | ICD-10-CM | POA: Diagnosis not present

## 2024-02-21 DIAGNOSIS — C9000 Multiple myeloma not having achieved remission: Secondary | ICD-10-CM

## 2024-02-21 LAB — KAPPA/LAMBDA LIGHT CHAINS
Kappa free light chain: 4.3 mg/L (ref 3.3–19.4)
Kappa, lambda light chain ratio: 0.05 — ABNORMAL LOW (ref 0.26–1.65)
Lambda free light chains: 92.2 mg/L — ABNORMAL HIGH (ref 5.7–26.3)

## 2024-02-21 MED ORDER — HEPARIN SOD (PORK) LOCK FLUSH 100 UNIT/ML IV SOLN
500.0000 [IU] | Freq: Once | INTRAVENOUS | Status: AC | PRN
Start: 2024-02-21 — End: 2024-02-21
  Administered 2024-02-21: 500 [IU]
  Filled 2024-02-21: qty 5

## 2024-02-21 MED ORDER — DEXTROSE 5 % IV SOLN
56.0000 mg/m2 | Freq: Once | INTRAVENOUS | Status: AC
  Administered 2024-02-21: 100 mg via INTRAVENOUS
  Filled 2024-02-21: qty 30

## 2024-02-21 MED ORDER — DEXAMETHASONE SODIUM PHOSPHATE 10 MG/ML IJ SOLN
5.0000 mg | Freq: Once | INTRAMUSCULAR | Status: AC
  Administered 2024-02-21: 5 mg via INTRAVENOUS
  Filled 2024-02-21: qty 1

## 2024-02-21 MED ORDER — SODIUM CHLORIDE 0.9 % IV SOLN
Freq: Once | INTRAVENOUS | Status: AC
  Filled 2024-02-21: qty 250

## 2024-02-21 MED ORDER — SODIUM CHLORIDE 0.9% FLUSH
10.0000 mL | INTRAVENOUS | Status: DC | PRN
  Administered 2024-02-21: 10 mL
  Filled 2024-02-21: qty 10

## 2024-02-21 MED ORDER — SODIUM CHLORIDE 0.9 % IV SOLN
Freq: Once | INTRAVENOUS | Status: AC
Start: 2024-02-21 — End: 2024-02-21
  Filled 2024-02-21: qty 250

## 2024-02-21 NOTE — Patient Instructions (Signed)
 [facility] - A DEPT OF [facility]  Discharge Instructions: Thank you for choosing [facility] to provide your oncology and hematology care.  If you have a lab appointment with the Cancer Center, please go directly to the Cancer Center and check in at the registration area.  Wear comfortable clothing and clothing appropriate for easy access to any Portacath or PICC line.   We strive to give you quality time with your provider. You may need to reschedule your appointment if you arrive late (15 or more minutes).  Arriving late affects you and other patients whose appointments are after yours.  Also, if you miss three or more appointments without notifying the office, you may be dismissed from the clinic at the provider's discretion.      For prescription refill requests, have your pharmacy contact our office and allow 72 hours for refills to be completed.    Today you received the following chemotherapy and/or immunotherapy agents kyprolis      To help prevent nausea and vomiting after your treatment, we encourage you to take your nausea medication as directed.  BELOW ARE SYMPTOMS THAT SHOULD BE REPORTED IMMEDIATELY: *FEVER GREATER THAN 100.4 F (38 C) OR HIGHER *CHILLS OR SWEATING *NAUSEA AND VOMITING THAT IS NOT CONTROLLED WITH YOUR NAUSEA MEDICATION *UNUSUAL SHORTNESS OF BREATH *UNUSUAL BRUISING OR BLEEDING *URINARY PROBLEMS (pain or burning when urinating, or frequent urination) *BOWEL PROBLEMS (unusual diarrhea, constipation, pain near the anus) TENDERNESS IN MOUTH AND THROAT WITH OR WITHOUT PRESENCE OF ULCERS (sore throat, sores in mouth, or a toothache) UNUSUAL RASH, SWELLING OR PAIN  UNUSUAL VAGINAL DISCHARGE OR ITCHING   Items with * indicate a potential emergency and should be followed up as soon as possible or go to the Emergency Department if any problems should occur.  Please show the CHEMOTHERAPY ALERT CARD or IMMUNOTHERAPY  ALERT CARD at check-in to the Emergency Department and triage nurse.  Should you have questions after your visit or need to cancel or reschedule your appointment, please contact [facility] - A DEPT OF [facility]  [phone] and follow the prompts.  Office hours are 8:00 a.m. to 4:30 p.m. Monday - Friday. Please note that voicemails left after 4:00 p.m. may not be returned until the following business day.  We are closed weekends and major holidays. You have access to a nurse at all times for urgent questions. Please call the main number to the clinic [phone] and follow the prompts.  For any non-urgent questions, you may also contact your provider using MyChart. We now offer e-Visits for anyone 63 and older to request care online for non-urgent symptoms. For details visit [email or web address].   Also download the MyChart app! Go to the app store, search "MyChart", open the app, select [place], and log in with your MyChart username and password.

## 2024-02-22 LAB — MULTIPLE MYELOMA PANEL, SERUM
Albumin SerPl Elph-Mcnc: 3.6 g/dL (ref 2.9–4.4)
Albumin/Glob SerPl: 1.4 (ref 0.7–1.7)
Alpha 1: 0.1 g/dL (ref 0.0–0.4)
Alpha2 Glob SerPl Elph-Mcnc: 0.6 g/dL (ref 0.4–1.0)
B-Globulin SerPl Elph-Mcnc: 1.2 g/dL (ref 0.7–1.3)
Gamma Glob SerPl Elph-Mcnc: 0.7 g/dL (ref 0.4–1.8)
Globulin, Total: 2.6 g/dL (ref 2.2–3.9)
IgA: 325 mg/dL (ref 64–422)
IgG (Immunoglobin G), Serum: 443 mg/dL — ABNORMAL LOW (ref 586–1602)
IgM (Immunoglobulin M), Srm: <5 mg/dL — ABNORMAL LOW (ref 26–217)
M Protein SerPl Elph-Mcnc: 0.2 g/dL — ABNORMAL HIGH
Total Protein ELP: 6.2 g/dL (ref 6.0–8.5)

## 2024-02-22 LAB — IMMUNOGLOBULINS A/E/G/M, SERUM
IgA: 327 mg/dL (ref 64–422)
IgE (Immunoglobulin E), Serum: 45 [IU]/mL (ref 6–495)
IgG (Immunoglobin G), Serum: 478 mg/dL — ABNORMAL LOW (ref 586–1602)
IgM (Immunoglobulin M), Srm: <5 mg/dL — ABNORMAL LOW (ref 26–217)

## 2024-02-27 ENCOUNTER — Inpatient Hospital Stay

## 2024-02-27 ENCOUNTER — Inpatient Hospital Stay (HOSPITAL_BASED_OUTPATIENT_CLINIC_OR_DEPARTMENT_OTHER): Admitting: Internal Medicine

## 2024-02-27 ENCOUNTER — Encounter: Payer: Self-pay | Admitting: Internal Medicine

## 2024-02-27 ENCOUNTER — Inpatient Hospital Stay: Attending: Internal Medicine

## 2024-02-27 ENCOUNTER — Other Ambulatory Visit: Payer: Self-pay | Admitting: Internal Medicine

## 2024-02-27 VITALS — BP 150/80 | HR 52 | Temp 97.8°F | Resp 20 | Ht 62.0 in | Wt 155.5 lb

## 2024-02-27 VITALS — BP 189/80 | HR 50 | Resp 18

## 2024-02-27 DIAGNOSIS — Z79620 Long term (current) use of immunosuppressive biologic: Secondary | ICD-10-CM | POA: Insufficient documentation

## 2024-02-27 DIAGNOSIS — C9002 Multiple myeloma in relapse: Secondary | ICD-10-CM | POA: Diagnosis not present

## 2024-02-27 DIAGNOSIS — I5032 Chronic diastolic (congestive) heart failure: Secondary | ICD-10-CM

## 2024-02-27 DIAGNOSIS — Z5112 Encounter for antineoplastic immunotherapy: Secondary | ICD-10-CM | POA: Insufficient documentation

## 2024-02-27 DIAGNOSIS — C9000 Multiple myeloma not having achieved remission: Secondary | ICD-10-CM | POA: Diagnosis not present

## 2024-02-27 LAB — CMP (CANCER CENTER ONLY)
ALT: 18 U/L (ref 0–44)
AST: 22 U/L (ref 15–41)
Albumin: 4.1 g/dL (ref 3.5–5.0)
Alkaline Phosphatase: 71 U/L (ref 38–126)
Anion gap: 7 (ref 5–15)
BUN: 9 mg/dL (ref 8–23)
CO2: 21 mmol/L — ABNORMAL LOW (ref 22–32)
Calcium: 8 mg/dL — ABNORMAL LOW (ref 8.9–10.3)
Chloride: 111 mmol/L (ref 98–111)
Creatinine: 0.81 mg/dL (ref 0.44–1.00)
GFR, Estimated: >60 mL/min (ref >60)
Glucose, Bld: 91 mg/dL (ref 70–99)
Potassium: 3.3 mmol/L — ABNORMAL LOW (ref 3.5–5.1)
Sodium: 139 mmol/L (ref 135–145)
Total Bilirubin: 0.6 mg/dL (ref 0.0–1.2)
Total Protein: 6.8 g/dL (ref 6.5–8.1)

## 2024-02-27 LAB — CBC WITH DIFFERENTIAL (CANCER CENTER ONLY)
Abs Immature Granulocytes: 0.02 K/uL (ref 0.00–0.07)
Basophils Absolute: 0 K/uL (ref 0.0–0.1)
Basophils Relative: 0 %
Eosinophils Absolute: 0.1 K/uL (ref 0.0–0.5)
Eosinophils Relative: 2 %
HCT: 35.8 % — ABNORMAL LOW (ref 36.0–46.0)
Hemoglobin: 11.8 g/dL — ABNORMAL LOW (ref 12.0–15.0)
Immature Granulocytes: 0 %
Lymphocytes Relative: 50 %
Lymphs Abs: 2.6 K/uL (ref 0.7–4.0)
MCH: 29.4 pg (ref 26.0–34.0)
MCHC: 33 g/dL (ref 30.0–36.0)
MCV: 89.1 fL (ref 80.0–100.0)
Monocytes Absolute: 0.4 K/uL (ref 0.1–1.0)
Monocytes Relative: 8 %
Neutro Abs: 2.1 K/uL (ref 1.7–7.7)
Neutrophils Relative %: 40 %
Platelet Count: 97 K/uL — ABNORMAL LOW (ref 150–400)
RBC: 4.02 MIL/uL (ref 3.87–5.11)
RDW: 13.4 % (ref 11.5–15.5)
WBC Count: 5.3 K/uL (ref 4.0–10.5)
nRBC: 0 % (ref 0.0–0.2)

## 2024-02-27 MED ORDER — DEXTROSE 5 % IV SOLN
56.0000 mg/m2 | Freq: Once | INTRAVENOUS | Status: AC
  Administered 2024-02-27: 100 mg via INTRAVENOUS
  Filled 2024-02-27: qty 30

## 2024-02-27 MED ORDER — SODIUM CHLORIDE 0.9 % IV SOLN
Freq: Once | INTRAVENOUS | Status: AC
Start: 2024-02-27 — End: 2024-02-27
  Filled 2024-02-27: qty 250

## 2024-02-27 MED ORDER — SODIUM CHLORIDE 0.9 % IV SOLN
Freq: Once | INTRAVENOUS | Status: AC
  Filled 2024-02-27: qty 250

## 2024-02-27 MED ORDER — DEXAMETHASONE SODIUM PHOSPHATE 10 MG/ML IJ SOLN
5.0000 mg | Freq: Once | INTRAMUSCULAR | Status: AC
  Administered 2024-02-27: 5 mg via INTRAVENOUS
  Filled 2024-02-27: qty 1

## 2024-02-27 NOTE — Progress Notes (Signed)
 [facility] OFFICE PROGRESS NOTE  Patient Care Team: [place] HERO, MD as PCP - General (Family Medicine) End, Lonni, MD as PCP - Cardiology (Cardiology) Guinevere File, [name] Rennie Cindy SAUNDERS, MD as Consulting Physician (Oncology) Parris Manna, MD as Consulting Physician (Pulmonary Disease) Damian Therisa HERO, MD as Consulting Physician (Endocrinology)   Cancer Staging  Multiple myeloma not having achieved remission [facility]) Staging form: Plasma Cell Myeloma and Plasma Cell Disorders, AJCC 8th Edition - Clinical: No stage assigned - Unsigned - Clinical: No stage assigned - Unsigned   Oncology History Overview Note  # SEP 2018- MULTIPLE MYELOMA IgALamda [2.5 gm/dl; K/L= 05/8700]; STAGE III [beta 2 microglobulin=5.5] [presented with acute renal failure; anemia; NO hypercalcemia; Skeletal survey-Normal]; BMBx- 45% plasma cells; FISH-POSITIVE 11:14 translocation.[STANDARD-high RISK]/cyto-Normal; SEP 2018- PET- L3 posterior element lesion.   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 No morphologic support for residual myeloma disease. Negative for minimal residual disease by MM-MRD flow cytometry; HOLD REVLIMID  [leg swelling]; AUG 2021-PET scan negative for myeloma; continue to hold Revlimid   # MARCH 2021-diastolic congestive heart failure [Dr.End]  # BMBx- OCT 2021- BMBx- 5% plasmacytosis-however this appears to be more polyclonal  rather than monoclonal-not explain patient worsening anemia.   # OCT 2022- 18th- Dara- Rev-Dex; MARCH, 2024- PROGRESSION bone marrow biopsy 70% plasma cells; PET scan no bone lesions-however conus medullaris uptake-MRI lumbar spine-   # MAY 2024- DISCONTINUE carfilzomib -dexamethasone - Pomalyst ; [rising M-protein; MAY 2024- 1.3. ]   # start 12/27/2022 -Isa-Carfil-Dex;  [decrease dexamethasone  20 weekly- starting 05/04/23- sec to glaucoma].   # OCT 24th, 2024- Bone marrow Biopsy: The findings are consistent with  focal minimal residual lambda restricted plasma cell neoplasm- HOLD treatment sec to glaucoma; and drop in EF AUG 2024- 45-50% [March 2024- 50-55%]  # MARCH 16th, 2025- re-started Isa-Carfil-Dex;  #November 2021-hyperthyroidism/goiter- [D.Bennett/Dr.Solum]-methimazole . S/p Thyroidectomy [Dr.Kim; [facility]-AUG 2022]  --------------------------  # 12/12- RIGHT JUGULAR DVT-x 69m on xarelto ; finished April 2020; September 2020-EGD/dysphagia; Dr. Therisa  # Acute renal failure [Dr.Singh; Proteinuria 1.5gm/day ]; acyclovir Mickeal ------------------------------------------------------------------------------------------------------------   DIAGNOSIS: [ ]  MULTIPLE MYELOMA  STAGE: III/HIGH RISK ;GOALS: CONTROL      Multiple myeloma not having achieved remission (HCC)  11/21/2017 - 04/28/2018 Chemotherapy   Patient is on Treatment Plan : MYELOMA SALVAGE Cyclophosphamide  / Carfilzomib  / Dexamethasone  (CCd) q28d     05/12/2021 - 02/25/2022 Chemotherapy   Patient is on Treatment Plan : MYELOMA RELAPSED REFRACTORY Daratumumab  SQ + Lenalidomide  + Dexamethasone  (DaraRd) q28d     05/12/2021 - 07/23/2022 Chemotherapy   Patient is on Treatment Plan : MYELOMA RELAPSED REFRACTORY Daratumumab  SQ + Lenalidomide  + Dexamethasone  (DaraRd) q28d     11/02/2022 - 11/16/2022 Chemotherapy   Patient is on Treatment Plan : MYELOMA RELAPSED/ REFRACTORY Carfilzomib  D1,8,15 (20/27) + Pomalidomide  + Dexamethasone  (KPd) q28d      12/27/2022 -  Chemotherapy   Patient is on Treatment Plan : MYELOMA Carfilzomib  + Dexamethasone  + Isatuximab  q28d      INTERVAL HISTORY: Ambulating  independently.   Alone.  Brandi [name] 73 y.o.  female pleasant patient above history of relapsed multiple myeloma--most recently on  Isa- Carfil-Dex  is here for a follow up.  Patient is currently restarted back on chemotherapy however had to stop about 3 months-because of infection  No fevers or chills. No infections. Denies any worsening pain.  Patient taking tramadol  as needed for upper and leg cramps. Mild tingling in feet.  No difficulty breathing.   Review of Systems  Constitutional:  Positive for malaise/fatigue. Negative for chills, diaphoresis and fever.  HENT:  Negative for nosebleeds and sore throat.   Eyes:  Negative for double vision.  Respiratory:  Negative for hemoptysis.   Cardiovascular:  Negative for chest pain, palpitations and orthopnea.  Gastrointestinal:  Negative for abdominal pain, blood in stool, constipation, heartburn and melena.  Genitourinary:  Negative for dysuria, frequency and urgency.  Musculoskeletal:  Positive for back pain.  Skin: Negative.  Negative for itching and rash.  Neurological:  Negative for dizziness, tingling, focal weakness, weakness and headaches.  Endo/Heme/Allergies:  Does not bruise/bleed easily.  Psychiatric/Behavioral:  Negative for depression. The patient is not nervous/anxious.     PAST MEDICAL HISTORY :  Past Medical History:  Diagnosis Date   (HFpEF) heart failure with preserved ejection fraction (HCC)    a. 06/2020 Echo: EF 60-65%, no rwma, nl RV size/fxn. Triv MR. Triv TR/AI. Mod elev PASP.   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   Open wound of left thigh 12/15/2023   Pneumonia    Red blood cell antibody positive, compatible PRBC difficult to obtain    S/P autologous bone marrow transplantation (HCC)    Sepsis (HCC) 11/25/2023    PAST SURGICAL HISTORY :   Past Surgical History:  Procedure Laterality Date   ANTERIOR VITRECTOMY Left 10/07/2021   Procedure: ANTERIOR VITRECTOMY;  Surgeon: Mittie Gaskin, MD;  Location: [facility];  Service: Ophthalmology;  Laterality: Left;   CATARACT EXTRACTION W/PHACO Left 10/07/2021   Procedure: CATARACT EXTRACTION PHACO AND INTRAOCULAR LENS PLACEMENT (IOC) LEFT VISION BLUE;  Surgeon: Mittie Gaskin, MD;  Location: [facility];  Service: Ophthalmology;  Laterality: Left;  kit for manual incision 14.56 01:21.4   CATARACT EXTRACTION W/PHACO Right 10/21/2021   Procedure: CATARACT EXTRACTION PHACO AND INTRAOCULAR LENS PLACEMENT (IOC) RIGHT 11.12 01:48.1;  Surgeon: Mittie Gaskin, MD;  Location: [facility];  Service: Ophthalmology;  Laterality: Right;   ESOPHAGOGASTRODUODENOSCOPY (EGD) WITH PROPOFOL  N/A 03/30/2019   Procedure: ESOPHAGOGASTRODUODENOSCOPY (EGD) WITH PROPOFOL ;  Surgeon: Therisa Bi, MD;  Location: [facility] END[facility];  Service: Gastroenterology;  Laterality: N/A;   INCISION AND DRAINAGE ABSCESS Left 12/01/2023   Procedure: INCISION AND DRAINAGE, ABSCESS;  Surgeon: Lane Shope, MD;  Location: [facility] ORS;  Service: General;  Laterality: Left;   IR BONE MARROW BIOPSY & ASPIRATION  05/25/2023   IR FLUORO GUIDE PORT INSERTION RIGHT  12/16/2017   IR IMAGING GUIDED PORT INSERTION  06/15/2021   KNEE ARTHROSCOPY Left 09/03/2022   Procedure: ARTHROSCOPY KNEE DEBRIDEMENT;  Surgeon: Leora Lynwood SAUNDERS, MD;  Location: [facility] ORS;  Service: Orthopedics;  Laterality: Left;   THYROIDECTOMY N/A     FAMILY HISTORY :   Family History  Problem Relation Age of Onset  Pneumonia Mother    Seizures Father     SOCIAL  HISTORY:   Social History   Tobacco Use   Smoking status: Former    Current packs/day: 0.00    Types: Cigarettes    Quit date: 03/18/2021    Years since quitting: 2.9   Smokeless tobacco: Never   Tobacco comments:    2 cigarettes QOD  Vaping Use   Vaping status: Never Used  Substance Use Topics   Alcohol use: No   Drug use: No    ALLERGIES:  has no known allergies.  MEDICATIONS:  Current Outpatient Medications  Medication Sig Dispense Refill   acyclovir  (ZOVIRAX ) 400 MG tablet Take 1 tablet (400 mg total) by mouth 2 (two) times daily. 120 tablet 2   albuterol  (VENTOLIN  HFA) 108 (90 Base) MCG/ACT inhaler Inhale 2 puffs into the lungs every 6 (six) hours as needed for wheezing or shortness of breath. 8 g 3   aspirin  EC 81 MG EC tablet Take 1 tablet (81 mg total) by mouth daily. 90 tablet 3   Brinzolamide -Brimonidine  (SIMBRINZA) 1-0.2 % SUSP Apply 1 drop to eye in the morning and at bedtime. I drop both eyes twice a day     Calcium  Carb-Cholecalciferol 600-10 MG-MCG TABS Take 1 tablet by mouth daily.     calcium  carbonate (CALCIUM  600 HIGH POTENCY) 600 MG TABS tablet Take 2 tablets (1,200 mg total) by mouth 2 (two) times daily. 120 tablet 2   carvedilol  (COREG ) 3.125 MG tablet Take 3.125 mg by mouth.     ertapenem  1 g in sodium chloride  0.9 % 100 mL Inject 1 g into the vein daily.     furosemide  (LASIX ) 40 MG tablet Take 1 tablet (40 mg total) by mouth daily as needed for edema or fluid.     levothyroxine  (SYNTHROID ) 175 MCG tablet Take 1 tablet (175 mcg total) by mouth daily. For thyroid  90 tablet 1   lidocaine -prilocaine  (EMLA ) cream Apply 1 application topically as needed. Apply to port and cover with saran wrap 1-2 hours prior to port access 30 g 1   magnesium  oxide (MAG-OX) 400 (240 Mg) MG tablet Take 1 tablet (400 mg total) by mouth 2 (two) times daily. 60 tablet 3   montelukast  (SINGULAIR ) 10 MG tablet Take 1 tablet (10 mg total) by mouth at bedtime. 30 tablet 3   pantoprazole   (PROTONIX ) 40 MG tablet Take 1 tablet (40 mg total) by mouth daily. 90 tablet 0   polyethylene glycol (MIRALAX  / GLYCOLAX ) packet Take 17 g by mouth daily as needed for mild constipation. 14 each 0   spironolactone  (ALDACTONE ) 25 MG tablet Take 1 tablet (25 mg total) by mouth daily as needed (edema). 30 tablet 1   traMADol  (ULTRAM ) 50 MG tablet Take 1 tablet (50 mg total) by mouth every 12 (twelve) hours as needed. 60 tablet 0   Vitamin D , Ergocalciferol , (DRISDOL ) 1.25 MG (50000 UNIT) CAPS capsule Take 1 capsule by mouth once a week 12 capsule 0   No current facility-administered medications for this visit.    PHYSICAL EXAMINATION:   BP (!) 150/80 Comment: re check  Pulse (!) 52   Temp 97.8 F (36.6 C)   Resp 20   Ht 5' 2 (1.575 m)   Wt 155 lb 8 oz (70.5 kg)   SpO2 100%   BMI 28.44 kg/m   Filed Weights   02/27/24 0840  Weight: 155 lb 8 oz (70.5 kg)  Mild leg swelling.   Physical Exam HENT:     Head: Normocephalic and atraumatic.  Eyes:     Pupils: Pupils are equal, round, and reactive to light.  Cardiovascular:     Rate and Rhythm: Normal rate and regular rhythm.  Pulmonary:     Effort: Pulmonary effort is normal. No respiratory distress.     Breath sounds: Normal breath sounds. No wheezing.  Abdominal:     General: Bowel sounds are normal. There is no distension.     Palpations: Abdomen is soft. There is no mass.     Tenderness: There is no abdominal tenderness. There is no guarding or rebound.  Musculoskeletal:        General: No tenderness. Normal range of motion.     Cervical back: Normal range of motion and neck supple.  Skin:    General: Skin is warm.  Neurological:     Mental Status: She is alert and oriented to person, place, and time.  Psychiatric:        Mood and Affect: Affect normal.    LABORATORY DATA:  I have reviewed the data as listed    Component Value Date/Time   NA 139 02/27/2024 0847   NA 141 01/25/2020 1530   K 3.3 (L)  02/27/2024 0847   CL 111 02/27/2024 0847   CO2 21 (L) 02/27/2024 0847   GLUCOSE 91 02/27/2024 0847   BUN 9 02/27/2024 0847   BUN 19 01/25/2020 1530   CREATININE 0.81 02/27/2024 0847   CALCIUM  8.0 (L) 02/27/2024 0847   PROT 6.8 02/27/2024 0847   ALBUMIN 4.1 02/27/2024 0847   AST 22 02/27/2024 0847   ALT 18 02/27/2024 0847   ALKPHOS 71 02/27/2024 0847   BILITOT 0.6 02/27/2024 0847   GFRNONAA >60 02/27/2024 0847   GFRAA NOT CALCULATED 04/17/2020 1352    No results found for: SPEP, UPEP  Lab Results  Component Value Date   WBC 5.2 02/20/2024   NEUTROABS 2.1 02/20/2024   HGB 11.1 (L) 02/20/2024   HCT 33.9 (L) 02/20/2024   MCV 91.1 02/20/2024   PLT 186 02/20/2024      Chemistry      Component Value Date/Time   NA 139 02/27/2024 0847   NA 141 01/25/2020 1530   K 3.3 (L) 02/27/2024 0847   CL 111 02/27/2024 0847   CO2 21 (L) 02/27/2024 0847   BUN 9 02/27/2024 0847   BUN 19 01/25/2020 1530   CREATININE 0.81 02/27/2024 0847      Component Value Date/Time   CALCIUM  8.0 (L) 02/27/2024 0847   ALKPHOS 71 02/27/2024 0847   AST 22 02/27/2024 0847   ALT 18 02/27/2024 0847   BILITOT 0.6 02/27/2024 0847      (H): Data is abnormally high   Latest Reference Range & Units Most Recent 12/24/20 14:01 04/01/21 14:34 04/28/21 09:17 06/11/21 09:42 07/03/21 10:32 08/20/21 10:06  M Protein SerPl Elph-Mcnc Not Observed g/dL 1.1 (H) (C) 8/73/76 89:93 Not Observed (C) 0.6 (H) (C) 0.7 (H) (C) 0.5 (H) (C) 0.7 (H) (C) 1.1 (H) (C)    Latest Reference Range & Units Most Recent 09/17/21 08:47 10/15/21 08:25 11/12/21 08:55  Kappa free light chain 3.3 - 19.4 mg/L 5.0 11/12/21 08:55 7.2 7.2 5.0  Lambda free light chains 5.7 - 26.3 mg/L 223.6 (H) 11/12/21 08:55 215.3 (H) 340.2 (H) 223.6 (H)  Kappa, lambda light chain ratio 0.26 - 1.65  0.02 (L) 11/12/21 08:55 0.03 (L) 0.02 (L) 0.02 (L)  (H):  Data is abnormally high (L): Data is abnormally low  RADIOGRAPHIC STUDIES: I have personally  reviewed the radiological images as listed and agreed with the findings in the report. No results found.   ASSESSMENT & PLAN:  Multiple myeloma not having achieved remission (HCC) # RECURRENT Multiple myeloma stage III [high-risk cytogenetics-status post KRD-VGPR ; status post autologous stem cell transplant on 06/15/18.   MAY 2024- 1.3. ]  currently on Isa-Dex; d-2 carfil weekly q 28 days. # MARCH 2025- M-protein -0.3 ; kappa lambda light chain ratio-1.8 lambda light chain 63-slowly RISING FEB 2025- Ejection fraction improved. However chemotherapy was interrupted-because of thigh infection needing wound VAC.  Last treatment was approximately 3 months ago in  May 2025.  # S/p  re-evaluation at [facility] in June 2025- Labs c/w early progression. No evidence of end organ damage, will repeat PET/CT- bone marrow in next 1-2 months based on the response to MM protein.  # For now proceed with with Isa-Dex-Carfil- Labs-CBC/chemistries were reviewed with the patient.  Order 2 d echo.   # DROP in EF:  MARCH 2024-Echo [[facility]] Left ventricular EF 50 to 55%; AUG 13th/OCT 23rd, 2024- EF-45-50%.  FEB 26th, 2025- Left ventricular ejection fraction, by estimation, is 50 to 55%. The left ventricle has low normal function Recommend monitoring for now. Stable.   # Anemia-Hb 9-10- JAn 2025- I ron sat- 8%; ferritin- 19 on prn venofer - Stable.   # Electrolyte: Hypokalemia: mild-  continue compliance Kdur TID-; hypocalcemia- 8.0- vit D SEP 2024- 50- continie Ergo 50k weekly. continue ca BID    sep 2024- Vit D 54. Stable.   # Iatrogenic hypothyroidism [ Graves/goiter s/p total thyroidectomy;aug,2022 ]-currently on Synthroid  150 mcg/day-[increased in AUG, 2024-  Stable.   # MAY 2025- thigh infection s/p wound VAC [Dr.Rodenberg] - improved- Quan Ig- 500.   # Glaucoma BIL- L > R [Dr.King; [place] [facility]] - ? Steroid induced on eye drops. decreased the steroids.  Discussed with ophthalmology- monitor for now. stable  # Lower  back discomfort x 6 months-see above.  MRI lumbar spine as above.  Continue tramadol - refilled . stable  # Bone lesions /last zometa  on 11/27/2017.  S/p   Dental extraction PET scan evidence of any bone lesions.- consider Zometa   at next visit.   # Infection prophylaxis: Acyclovir ; asprin- add IVIG infusion [IgG < 400]- plan  IVIG- q 4 w #1 on- 9/17- Stable.   # IV access: mediport- functional  # ACP: Patient has  incurable cancer.  However patient is currently doing well. Patient interested in continue current scope of therapy; and full code.   6 w- given dental extrac- on 1/26. :MM panel; K/l light chain ratio q 4 W  ; NO zometa - dental issues-ok; but hypocalemia  Starting aug 25th- q2w-MD: D-isa-dex;car1&2-car; weekly x 3; week 4-OFF;  q 28 day cycle PS:  # DISPOSITION: # Repeat BP # 2 d echo ASAP # order standing orders for 4 weeks- MM panel/k-l light chains- # weekly port-cbc/cmp # chemo today;  and tomorrow.  # follow up as per IS-  Dr.B    Orders Placed This Encounter  Procedures   CBC with Differential (Cancer Center Only)    Standing Status:   Future    Expected Date:   03/19/2024    Expiration Date:   03/19/2025   CMP (Cancer Center only)    Standing Status:   Future    Expected Date:   03/19/2024    Expiration Date:  03/19/2025   CBC with Differential (Cancer Center Only)    Standing Status:   Future    Expected Date:   03/26/2024    Expiration Date:   03/26/2025   CMP (Cancer Center only)    Standing Status:   Future    Expected Date:   03/26/2024    Expiration Date:   03/26/2025   CBC with Differential (Cancer Center Only)    Standing Status:   Future    Expected Date:   04/02/2024    Expiration Date:   04/02/2025   CMP (Cancer Center only)    Standing Status:   Future    Expected Date:   04/02/2024    Expiration Date:   04/02/2025   ECHOCARDIOGRAM COMPLETE    Standing Status:   Future    Expiration Date:   02/26/2025    Where should this test be performed:   [place]  [facility]    Perflutren DEFINITY (image enhancing agent) should be administered unless hypersensitivity or allergy exist:   Administer Perflutren    Reason for exam-Echo:   Chemo  Z09   All questions were answered. The patient knows to call the clinic with any problems, questions or concerns.      Cindy JONELLE Joe, MD 02/27/2024 9:36 AM End

## 2024-02-27 NOTE — Progress Notes (Signed)
 Pt aware of elevated BP.  Pt reports having an MD appt scheduled this week to discuss high BP.  Pt has no complaints.  Other vitals WNL.  Pt will return tomorrow for second day of treatment.  Pt advised to check BP if she starts feeling poorly.  Pt understands.

## 2024-02-27 NOTE — Progress Notes (Signed)
 Patient has no concerns

## 2024-02-27 NOTE — Patient Instructions (Signed)
 [facility] - A DEPT OF [facility]  Discharge Instructions: Thank you for choosing [facility] to provide your oncology and hematology care.  If you have a lab appointment with the Cancer Center, please go directly to the Cancer Center and check in at the registration area.  Wear comfortable clothing and clothing appropriate for easy access to any Portacath or PICC line.   We strive to give you quality time with your provider. You may need to reschedule your appointment if you arrive late (15 or more minutes).  Arriving late affects you and other patients whose appointments are after yours.  Also, if you miss three or more appointments without notifying the office, you may be dismissed from the clinic at the provider's discretion.      For prescription refill requests, have your pharmacy contact our office and allow 72 hours for refills to be completed.    Today you received the following chemotherapy and/or immunotherapy agents kyprolis      To help prevent nausea and vomiting after your treatment, we encourage you to take your nausea medication as directed.  BELOW ARE SYMPTOMS THAT SHOULD BE REPORTED IMMEDIATELY: *FEVER GREATER THAN 100.4 F (38 C) OR HIGHER *CHILLS OR SWEATING *NAUSEA AND VOMITING THAT IS NOT CONTROLLED WITH YOUR NAUSEA MEDICATION *UNUSUAL SHORTNESS OF BREATH *UNUSUAL BRUISING OR BLEEDING *URINARY PROBLEMS (pain or burning when urinating, or frequent urination) *BOWEL PROBLEMS (unusual diarrhea, constipation, pain near the anus) TENDERNESS IN MOUTH AND THROAT WITH OR WITHOUT PRESENCE OF ULCERS (sore throat, sores in mouth, or a toothache) UNUSUAL RASH, SWELLING OR PAIN  UNUSUAL VAGINAL DISCHARGE OR ITCHING   Items with * indicate a potential emergency and should be followed up as soon as possible or go to the Emergency Department if any problems should occur.  Please show the CHEMOTHERAPY ALERT CARD or IMMUNOTHERAPY  ALERT CARD at check-in to the Emergency Department and triage nurse.  Should you have questions after your visit or need to cancel or reschedule your appointment, please contact [facility] - A DEPT OF [facility]  [phone] and follow the prompts.  Office hours are 8:00 a.m. to 4:30 p.m. Monday - Friday. Please note that voicemails left after 4:00 p.m. may not be returned until the following business day.  We are closed weekends and major holidays. You have access to a nurse at all times for urgent questions. Please call the main number to the clinic [phone] and follow the prompts.  For any non-urgent questions, you may also contact your provider using MyChart. We now offer e-Visits for anyone 63 and older to request care online for non-urgent symptoms. For details visit [email or web address].   Also download the MyChart app! Go to the app store, search "MyChart", open the app, select [place], and log in with your MyChart username and password.

## 2024-02-27 NOTE — Assessment & Plan Note (Addendum)
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   MAY 2024- 1.3. ]  currently on Isa-Dex; d-2 carfil weekly q 28 days. # MARCH 2025- M-protein -0.3 ; kappa lambda light chain ratio-1.8 lambda light chain 63-slowly RISING FEB 2025- Ejection fraction improved. However chemotherapy was interrupted-because of thigh infection needing wound VAC.  Last treatment was approximately 3 months ago in  May 2025.  # S/p  re-evaluation at [facility] in June 2025- Labs c/w early progression. No evidence of end organ damage, will repeat PET/CT- bone marrow in next 1-2 months based on the response to MM protein.  # For now proceed with with Isa-Dex-Carfil- Labs-CBC/chemistries were reviewed with the patient.  Order 2 d echo.   # DROP in EF:  MARCH 2024-Echo [[facility]] Left ventricular EF 50 to 55%; AUG 13th/OCT 23rd, 2024- EF-45-50%.  FEB 26th, 2025- Left ventricular ejection fraction, by estimation, is 50 to 55%. The left ventricle has low normal function Recommend monitoring for now. Stable.   # Anemia-Hb 9-10- JAn 2025- I ron sat- 8%; ferritin- 19 on prn venofer - Stable.   # Electrolyte: Hypokalemia: mild-  continue compliance Kdur TID-; hypocalcemia- 8.0- vit D SEP 2024- 50- continie Ergo 50k weekly. continue ca BID    sep 2024- Vit D 54. Stable.   # Iatrogenic hypothyroidism [ Graves/goiter s/p total thyroidectomy;aug,2022 ]-currently on Synthroid  150 mcg/day-[increased in AUG, 2024-  Stable.   # MAY 2025- thigh infection s/p wound VAC [Dr.Rodenberg] - improved- Quan Ig- 500.   # Glaucoma BIL- L > R [Dr.King; [place] [facility]] - ? Steroid induced on eye drops. decreased the steroids.  Discussed with ophthalmology- monitor for now. stable  # Lower back discomfort x 6 months-see above.  MRI lumbar spine as above.  Continue tramadol - refilled . stable  # Bone lesions /last zometa  on 11/27/2017.  S/p   Dental extraction PET scan  evidence of any bone lesions.- consider Zometa   at next visit.   # Infection prophylaxis: Acyclovir ; asprin- add IVIG infusion [IgG < 400]- plan  IVIG- q 4 w #1 on- 9/17- Stable.   # IV access: mediport- functional  # ACP: Patient has  incurable cancer.  However patient is currently doing well. Patient interested in continue current scope of therapy; and full code.   6 w- given dental extrac- on 1/26. :MM panel; K/l light chain ratio q 4 W  ; NO zometa - dental issues-ok; but hypocalemia  Starting aug 25th- q2w-MD: D-isa-dex;car1&2-car; weekly x 3; week 4-OFF;  q 28 day cycle PS:  # DISPOSITION: # Repeat BP # 2 d echo ASAP # order standing orders for 4 weeks- MM panel/k-l light chains- # weekly port-cbc/cmp # chemo today;  and tomorrow.  # follow up as per IS-  Dr.B

## 2024-02-28 ENCOUNTER — Other Ambulatory Visit: Payer: Self-pay

## 2024-02-28 ENCOUNTER — Inpatient Hospital Stay

## 2024-02-28 VITALS — BP 159/84 | HR 54 | Temp 97.6°F | Resp 18

## 2024-02-28 DIAGNOSIS — C9000 Multiple myeloma not having achieved remission: Secondary | ICD-10-CM

## 2024-02-28 DIAGNOSIS — Z5112 Encounter for antineoplastic immunotherapy: Secondary | ICD-10-CM | POA: Diagnosis not present

## 2024-02-28 MED ORDER — SODIUM CHLORIDE 0.9 % IV SOLN
Freq: Once | INTRAVENOUS | Status: AC
Start: 2024-02-28 — End: 2024-02-28
  Filled 2024-02-28: qty 250

## 2024-02-28 MED ORDER — DEXAMETHASONE SODIUM PHOSPHATE 10 MG/ML IJ SOLN
5.0000 mg | Freq: Once | INTRAMUSCULAR | Status: AC
  Administered 2024-02-28: 5 mg via INTRAVENOUS
  Filled 2024-02-28: qty 1

## 2024-02-28 MED ORDER — SODIUM CHLORIDE 0.9 % IV SOLN
Freq: Once | INTRAVENOUS | Status: AC
  Filled 2024-02-28: qty 250

## 2024-02-28 MED ORDER — DEXTROSE 5 % IV SOLN
56.0000 mg/m2 | Freq: Once | INTRAVENOUS | Status: AC
  Administered 2024-02-28: 100 mg via INTRAVENOUS
  Filled 2024-02-28: qty 30

## 2024-02-28 NOTE — Patient Instructions (Signed)
 [facility] - A DEPT OF [facility]  Discharge Instructions: Thank you for choosing [facility] to provide your oncology and hematology care.  If you have a lab appointment with the Cancer Center, please go directly to the Cancer Center and check in at the registration area.  Wear comfortable clothing and clothing appropriate for easy access to any Portacath or PICC line.   We strive to give you quality time with your provider. You may need to reschedule your appointment if you arrive late (15 or more minutes).  Arriving late affects you and other patients whose appointments are after yours.  Also, if you miss three or more appointments without notifying the office, you may be dismissed from the clinic at the provider's discretion.      For prescription refill requests, have your pharmacy contact our office and allow 72 hours for refills to be completed.    Today you received the following chemotherapy and/or immunotherapy agents Kyprolis      To help prevent nausea and vomiting after your treatment, we encourage you to take your nausea medication as directed.  BELOW ARE SYMPTOMS THAT SHOULD BE REPORTED IMMEDIATELY: *FEVER GREATER THAN 100.4 F (38 C) OR HIGHER *CHILLS OR SWEATING *NAUSEA AND VOMITING THAT IS NOT CONTROLLED WITH YOUR NAUSEA MEDICATION *UNUSUAL SHORTNESS OF BREATH *UNUSUAL BRUISING OR BLEEDING *URINARY PROBLEMS (pain or burning when urinating, or frequent urination) *BOWEL PROBLEMS (unusual diarrhea, constipation, pain near the anus) TENDERNESS IN MOUTH AND THROAT WITH OR WITHOUT PRESENCE OF ULCERS (sore throat, sores in mouth, or a toothache) UNUSUAL RASH, SWELLING OR PAIN  UNUSUAL VAGINAL DISCHARGE OR ITCHING   Items with * indicate a potential emergency and should be followed up as soon as possible or go to the Emergency Department if any problems should occur.  Please show the CHEMOTHERAPY ALERT CARD or IMMUNOTHERAPY  ALERT CARD at check-in to the Emergency Department and triage nurse.  Should you have questions after your visit or need to cancel or reschedule your appointment, please contact [facility] - A DEPT OF [facility]  [phone] and follow the prompts.  Office hours are 8:00 a.m. to 4:30 p.m. Monday - Friday. Please note that voicemails left after 4:00 p.m. may not be returned until the following business day.  We are closed weekends and major holidays. You have access to a nurse at all times for urgent questions. Please call the main number to the clinic [phone] and follow the prompts.  For any non-urgent questions, you may also contact your provider using MyChart. We now offer e-Visits for anyone 36 and older to request care online for non-urgent symptoms. For details visit [email or web address].   Also download the MyChart app! Go to the app store, search "MyChart", open the app, select , and log in with your MyChart username and password.

## 2024-03-05 ENCOUNTER — Inpatient Hospital Stay

## 2024-03-05 ENCOUNTER — Inpatient Hospital Stay (HOSPITAL_BASED_OUTPATIENT_CLINIC_OR_DEPARTMENT_OTHER): Admitting: Internal Medicine

## 2024-03-05 ENCOUNTER — Other Ambulatory Visit: Payer: Self-pay

## 2024-03-05 ENCOUNTER — Encounter: Payer: Self-pay | Admitting: Internal Medicine

## 2024-03-05 VITALS — BP 147/77 | HR 65 | Temp 98.3°F | Resp 16 | Ht 62.0 in | Wt 157.6 lb

## 2024-03-05 VITALS — BP 130/67 | HR 68 | Resp 18

## 2024-03-05 DIAGNOSIS — Z5112 Encounter for antineoplastic immunotherapy: Secondary | ICD-10-CM | POA: Diagnosis not present

## 2024-03-05 DIAGNOSIS — C9000 Multiple myeloma not having achieved remission: Secondary | ICD-10-CM

## 2024-03-05 LAB — CMP (CANCER CENTER ONLY)
ALT: 15 U/L (ref 0–44)
AST: 17 U/L (ref 15–41)
Albumin: 3.7 g/dL (ref 3.5–5.0)
Alkaline Phosphatase: 66 U/L (ref 38–126)
Anion gap: 9 (ref 5–15)
BUN: 16 mg/dL (ref 8–23)
CO2: 20 mmol/L — ABNORMAL LOW (ref 22–32)
Calcium: 8.3 mg/dL — ABNORMAL LOW (ref 8.9–10.3)
Chloride: 110 mmol/L (ref 98–111)
Creatinine: 0.81 mg/dL (ref 0.44–1.00)
GFR, Estimated: >60 mL/min (ref >60)
Glucose, Bld: 94 mg/dL (ref 70–99)
Potassium: 3.4 mmol/L — ABNORMAL LOW (ref 3.5–5.1)
Sodium: 139 mmol/L (ref 135–145)
Total Bilirubin: 0.6 mg/dL (ref 0.0–1.2)
Total Protein: 6.7 g/dL (ref 6.5–8.1)

## 2024-03-05 LAB — TSH: TSH: 18.183 u[IU]/mL — ABNORMAL HIGH (ref 0.350–4.500)

## 2024-03-05 LAB — CBC WITH DIFFERENTIAL (CANCER CENTER ONLY)
Abs Immature Granulocytes: 0.01 K/uL (ref 0.00–0.07)
Basophils Absolute: 0 K/uL (ref 0.0–0.1)
Basophils Relative: 0 %
Eosinophils Absolute: 0.1 K/uL (ref 0.0–0.5)
Eosinophils Relative: 1 %
HCT: 36.3 % (ref 36.0–46.0)
Hemoglobin: 12.2 g/dL (ref 12.0–15.0)
Immature Granulocytes: 0 %
Lymphocytes Relative: 37 %
Lymphs Abs: 1.7 K/uL (ref 0.7–4.0)
MCH: 30.1 pg (ref 26.0–34.0)
MCHC: 33.6 g/dL (ref 30.0–36.0)
MCV: 89.6 fL (ref 80.0–100.0)
Monocytes Absolute: 0.4 K/uL (ref 0.1–1.0)
Monocytes Relative: 10 %
Neutro Abs: 2.4 K/uL (ref 1.7–7.7)
Neutrophils Relative %: 52 %
Platelet Count: 163 K/uL (ref 150–400)
RBC: 4.05 MIL/uL (ref 3.87–5.11)
RDW: 14.5 % (ref 11.5–15.5)
WBC Count: 4.6 K/uL (ref 4.0–10.5)
nRBC: 0.4 % — ABNORMAL HIGH (ref 0.0–0.2)

## 2024-03-05 MED ORDER — SODIUM CHLORIDE 0.9 % IV SOLN
Freq: Once | INTRAVENOUS | Status: AC
  Filled 2024-03-05: qty 250

## 2024-03-05 MED ORDER — FAMOTIDINE IN NACL 20-0.9 MG/50ML-% IV SOLN
20.0000 mg | Freq: Once | INTRAVENOUS | Status: AC
  Administered 2024-03-05 (×2): 20 mg via INTRAVENOUS
  Filled 2024-03-05: qty 50

## 2024-03-05 MED ORDER — SODIUM CHLORIDE 0.9 % IV SOLN
10.0000 mg/kg | Freq: Once | INTRAVENOUS | Status: AC
  Administered 2024-03-05 (×2): 700 mg via INTRAVENOUS
  Filled 2024-03-05: qty 25

## 2024-03-05 MED ORDER — DIPHENHYDRAMINE HCL 50 MG/ML IJ SOLN
50.0000 mg | Freq: Once | INTRAMUSCULAR | Status: AC
  Administered 2024-03-05 (×2): 50 mg via INTRAVENOUS
  Filled 2024-03-05: qty 1

## 2024-03-05 MED ORDER — ACETAMINOPHEN 325 MG PO TABS
650.0000 mg | ORAL_TABLET | Freq: Once | ORAL | Status: AC
  Administered 2024-03-05 (×2): 650 mg via ORAL
  Filled 2024-03-05: qty 2

## 2024-03-05 MED ORDER — DEXTROSE 5 % IV SOLN
56.0000 mg/m2 | Freq: Once | INTRAVENOUS | Status: AC
  Administered 2024-03-05 (×2): 100 mg via INTRAVENOUS
  Filled 2024-03-05: qty 30

## 2024-03-05 MED ORDER — DEXAMETHASONE SODIUM PHOSPHATE 10 MG/ML IJ SOLN
5.0000 mg | Freq: Once | INTRAMUSCULAR | Status: AC
  Administered 2024-03-05 (×2): 5 mg via INTRAVENOUS
  Filled 2024-03-05: qty 1

## 2024-03-05 NOTE — Progress Notes (Signed)
 [facility] OFFICE PROGRESS NOTE  Patient Care Team: Buren Rock HERO, MD as PCP - General (Family Medicine) End, Lonni, MD as PCP - Cardiology (Cardiology) Guinevere File, [name] Rennie Cindy SAUNDERS, MD as Consulting Physician (Oncology) Parris Manna, MD as Consulting Physician (Pulmonary Disease) Damian Therisa HERO, MD as Consulting Physician (Endocrinology)   Cancer Staging  Multiple myeloma not having achieved remission [facility]) Staging form: Plasma Cell Myeloma and Plasma Cell Disorders, AJCC 8th Edition - Clinical: No stage assigned - Unsigned - Clinical: No stage assigned - Unsigned   Oncology History Overview Note  # SEP 2018- MULTIPLE MYELOMA IgALamda [2.5 gm/dl; K/L= 05/8700]; STAGE III [beta 2 microglobulin=5.5] [presented with acute renal failure; anemia; NO hypercalcemia; Skeletal survey-Normal]; BMBx- 45% plasma cells; FISH-POSITIVE 11:14 translocation.[STANDARD-high RISK]/cyto-Normal; SEP 2018- PET- L3 posterior element lesion.   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 No morphologic support for residual myeloma disease. Negative for minimal residual disease by MM-MRD flow cytometry; HOLD REVLIMID  [leg swelling]; AUG 2021-PET scan negative for myeloma; continue to hold Revlimid   # MARCH 2021-diastolic congestive heart failure [Dr.End]  # BMBx- OCT 2021- BMBx- 5% plasmacytosis-however this appears to be more polyclonal  rather than monoclonal-not explain patient worsening anemia.   # OCT 2022- 18th- Dara- Rev-Dex; MARCH, 2024- PROGRESSION bone marrow biopsy 70% plasma cells; PET scan no bone lesions-however conus medullaris uptake-MRI lumbar spine-   # MAY 2024- DISCONTINUE carfilzomib -dexamethasone - Pomalyst ; [rising M-protein; MAY 2024- 1.3. ]   # start 12/27/2022 -Isa-Carfil-Dex;  [decrease dexamethasone  20 weekly- starting 05/04/23- sec to glaucoma].   # OCT 24th, 2024- Bone marrow Biopsy: The findings are consistent with  focal minimal residual lambda restricted plasma cell neoplasm- HOLD treatment sec to glaucoma; and drop in EF AUG 2024- 45-50% [March 2024- 50-55%]  # MARCH 16th, 2025- re-started Isa-Carfil-Dex;  #November 2021-hyperthyroidism/goiter- [D.Bennett/Dr.Solum]-methimazole . S/p Thyroidectomy [Dr.Kim; [facility]-AUG 2022]  --------------------------  # 12/12- RIGHT JUGULAR DVT-x 66m on xarelto ; finished April 2020; September 2020-EGD/dysphagia; Dr. Therisa  # Acute renal failure [Dr.Singh; Proteinuria 1.5gm/day ]; acyclovir Mickeal ------------------------------------------------------------------------------------------------------------   DIAGNOSIS: [ ]  MULTIPLE MYELOMA  STAGE: III/HIGH RISK ;GOALS: CONTROL      Multiple myeloma not having achieved remission (HCC)  11/21/2017 - 04/28/2018 Chemotherapy   Patient is on Treatment Plan : MYELOMA SALVAGE Cyclophosphamide  / Carfilzomib  / Dexamethasone  (CCd) q28d     05/12/2021 - 02/25/2022 Chemotherapy   Patient is on Treatment Plan : MYELOMA RELAPSED REFRACTORY Daratumumab  SQ + Lenalidomide  + Dexamethasone  (DaraRd) q28d     05/12/2021 - 07/23/2022 Chemotherapy   Patient is on Treatment Plan : MYELOMA RELAPSED REFRACTORY Daratumumab  SQ + Lenalidomide  + Dexamethasone  (DaraRd) q28d     11/02/2022 - 11/16/2022 Chemotherapy   Patient is on Treatment Plan : MYELOMA RELAPSED/ REFRACTORY Carfilzomib  D1,8,15 (20/27) + Pomalidomide  + Dexamethasone  (KPd) q28d      12/27/2022 -  Chemotherapy   Patient is on Treatment Plan : MYELOMA Carfilzomib  + Dexamethasone  + Isatuximab  q28d      INTERVAL HISTORY: Ambulating  independently.   Alone.  Brandi [name] 73 y.o.  female pleasant patient above history of relapsed multiple myeloma--currently re-started back after 3 months break [JULY 28th, 2025]  Isa- Carfil-Dex  is here for a follow up.  No fevers or chills. No infections. Denies any worsening pain.  Patient taking tramadol  as needed for upper and leg cramps. Mild tingling in feet.  No difficulty breathing. No worsening leg swelling.    Review of Systems  Constitutional:  Positive for malaise/fatigue. Negative for chills, diaphoresis and fever.  HENT:  Negative for nosebleeds and sore throat.   Eyes:  Negative for double vision.  Respiratory:  Negative for hemoptysis.   Cardiovascular:  Negative for chest pain, palpitations and orthopnea.  Gastrointestinal:  Negative for abdominal pain, blood in stool, constipation, heartburn and melena.  Genitourinary:  Negative for dysuria, frequency and urgency.  Musculoskeletal:  Positive for back pain.  Skin: Negative.  Negative for itching and rash.  Neurological:  Negative for dizziness, tingling, focal weakness, weakness and headaches.  Endo/Heme/Allergies:  Does not bruise/bleed easily.  Psychiatric/Behavioral:  Negative for depression. The patient is not nervous/anxious.     PAST MEDICAL HISTORY :  Past Medical History:  Diagnosis Date   (HFpEF) heart failure with preserved ejection fraction (HCC)    a. 06/2020 Echo: EF 60-65%, no rwma, nl RV size/fxn. Triv MR. Triv TR/AI. Mod elev PASP.   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   Open wound of left thigh 12/15/2023   Pneumonia    Red blood cell antibody positive, compatible PRBC difficult to obtain    S/P autologous bone marrow transplantation (HCC)    Sepsis (HCC) 11/25/2023    PAST SURGICAL HISTORY :   Past Surgical History:  Procedure Laterality Date   ANTERIOR VITRECTOMY Left 10/07/2021   Procedure: ANTERIOR VITRECTOMY;  Surgeon: Mittie Gaskin, MD;  Location: [facility];  Service: Ophthalmology;  Laterality: Left;   CATARACT EXTRACTION W/PHACO Left 10/07/2021   Procedure: CATARACT EXTRACTION PHACO AND INTRAOCULAR LENS PLACEMENT (IOC) LEFT VISION BLUE;  Surgeon: Mittie Gaskin, MD;  Location: [facility];  Service: Ophthalmology;  Laterality: Left;  kit for manual incision 14.56 01:21.4   CATARACT EXTRACTION W/PHACO Right 10/21/2021   Procedure: CATARACT EXTRACTION PHACO AND INTRAOCULAR LENS PLACEMENT (IOC) RIGHT 11.12 01:48.1;  Surgeon: Mittie Gaskin, MD;  Location: [facility];  Service: Ophthalmology;  Laterality: Right;   ESOPHAGOGASTRODUODENOSCOPY (EGD) WITH PROPOFOL  N/A 03/30/2019   Procedure: ESOPHAGOGASTRODUODENOSCOPY (EGD) WITH PROPOFOL ;  Surgeon: Therisa Bi, MD;  Location: [facility] END[facility];  Service: Gastroenterology;  Laterality: N/A;   INCISION AND DRAINAGE ABSCESS Left 12/01/2023   Procedure: INCISION AND DRAINAGE, ABSCESS;  Surgeon: Lane Shope, MD;  Location: [facility] ORS;  Service: General;  Laterality: Left;   IR BONE MARROW BIOPSY & ASPIRATION  05/25/2023   IR FLUORO GUIDE PORT INSERTION RIGHT  12/16/2017   IR IMAGING GUIDED PORT INSERTION  06/15/2021   KNEE ARTHROSCOPY Left 09/03/2022   Procedure: ARTHROSCOPY KNEE DEBRIDEMENT;  Surgeon: Leora Lynwood SAUNDERS, MD;  Location: [facility] ORS;  Service: Orthopedics;  Laterality: Left;   THYROIDECTOMY N/A     FAMILY HISTORY :   Family History  Problem Relation Age of Onset   Pneumonia Mother  Seizures Father     SOCIAL HISTORY:   Social History    Tobacco Use   Smoking status: Former    Current packs/day: 0.00    Types: Cigarettes    Quit date: 03/18/2021    Years since quitting: 2.9   Smokeless tobacco: Never   Tobacco comments:    2 cigarettes QOD  Vaping Use   Vaping status: Never Used  Substance Use Topics   Alcohol use: No   Drug use: No    ALLERGIES:  has no known allergies.  MEDICATIONS:  Current Outpatient Medications  Medication Sig Dispense Refill   acyclovir  (ZOVIRAX ) 400 MG tablet Take 1 tablet (400 mg total) by mouth 2 (two) times daily. 120 tablet 2   albuterol  (VENTOLIN  HFA) 108 (90 Base) MCG/ACT inhaler Inhale 2 puffs into the lungs every 6 (six) hours as needed for wheezing or shortness of breath. 8 g 3   aspirin  EC 81 MG EC tablet Take 1 tablet (81 mg total) by mouth daily. 90 tablet 3   Brinzolamide -Brimonidine  (SIMBRINZA) 1-0.2 % SUSP Apply 1 drop to eye in the morning and at bedtime. I drop both eyes twice a day     Calcium  Carb-Cholecalciferol 600-10 MG-MCG TABS Take 1 tablet by mouth daily.     calcium  carbonate (CALCIUM  600 HIGH POTENCY) 600 MG TABS tablet Take 2 tablets (1,200 mg total) by mouth 2 (two) times daily. 120 tablet 2   furosemide  (LASIX ) 40 MG tablet Take 1 tablet (40 mg total) by mouth daily as needed for edema or fluid.     levothyroxine  (SYNTHROID ) 175 MCG tablet Take 1 tablet (175 mcg total) by mouth daily. For thyroid  90 tablet 1   lidocaine -prilocaine  (EMLA ) cream Apply 1 application topically as needed. Apply to port and cover with saran wrap 1-2 hours prior to port access 30 g 1   magnesium  oxide (MAG-OX) 400 (240 Mg) MG tablet Take 1 tablet (400 mg total) by mouth 2 (two) times daily. 60 tablet 3   montelukast  (SINGULAIR ) 10 MG tablet Take 1 tablet (10 mg total) by mouth at bedtime. 30 tablet 3   NIFEdipine (ADALAT CC) 30 MG 24 hr tablet Take 30 mg by mouth daily.     pantoprazole  (PROTONIX ) 40 MG tablet Take 1 tablet (40 mg total) by mouth daily. 90 tablet 0   polyethylene  glycol (MIRALAX  / GLYCOLAX ) packet Take 17 g by mouth daily as needed for mild constipation. 14 each 0   spironolactone  (ALDACTONE ) 25 MG tablet Take 1 tablet (25 mg total) by mouth daily as needed (edema). 30 tablet 1   traMADol  (ULTRAM ) 50 MG tablet Take 1 tablet (50 mg total) by mouth every 12 (twelve) hours as needed. 60 tablet 0   Vitamin D , Ergocalciferol , (DRISDOL ) 1.25 MG (50000 UNIT) CAPS capsule Take 1 capsule by mouth once a week 12 capsule 0   No current facility-administered medications for this visit.   Facility-Administered Medications Ordered in Other Visits  Medication Dose Route Frequency Provider Last Rate Last Admin   0.9 %  sodium chloride  infusion   Intravenous Once Brannon Decaire R, MD       acetaminophen  (TYLENOL ) tablet 650 mg  650 mg Oral Once Trell Secrist R, MD       carfilzomib  (KYPROLIS ) 100 mg in dextrose  5 % 100 mL chemo infusion  56 mg/m2 (Treatment Plan Recorded) Intravenous Once Louis Gaw R, MD       dexamethasone  (DECADRON ) injection 5 mg  5 mg  Intravenous Once Stoy Fenn R, MD       diphenhydrAMINE  (BENADRYL ) injection 50 mg  50 mg Intravenous Once Torsha Lemus R, MD       famotidine  (PEPCID ) IVPB 20 mg premix  20 mg Intravenous Once Cornisha Zetino R, MD       isatuximab -irfc (SARCLISA ) 700 mg in sodium chloride  0.9 % 215 mL (2.8 mg/mL) chemo infusion  10 mg/kg (Treatment Plan Recorded) Intravenous Once Hank Walling R, MD        PHYSICAL EXAMINATION:   BP (!) 147/77 (BP Location: Right Arm, Patient Position: Sitting, Cuff Size: Normal) Comment: pt advised bp elevated, keep check at home, contact pcp if con't to stay elevated  Pulse 65   Temp 98.3 F (36.8 C) (Tympanic)   Resp 16   Ht 5' 2 (1.575 m)   Wt 157 lb 9.6 oz (71.5 kg)   SpO2 100%   BMI 28.83 kg/m   Filed Weights   03/05/24 0951  Weight: 157 lb 9.6 oz (71.5 kg)         Mild leg swelling.   Physical Exam HENT:     Head:  Normocephalic and atraumatic.  Eyes:     Pupils: Pupils are equal, round, and reactive to light.  Cardiovascular:     Rate and Rhythm: Normal rate and regular rhythm.  Pulmonary:     Effort: Pulmonary effort is normal. No respiratory distress.     Breath sounds: Normal breath sounds. No wheezing.  Abdominal:     General: Bowel sounds are normal. There is no distension.     Palpations: Abdomen is soft. There is no mass.     Tenderness: There is no abdominal tenderness. There is no guarding or rebound.  Musculoskeletal:        General: No tenderness. Normal range of motion.     Cervical back: Normal range of motion and neck supple.  Skin:    General: Skin is warm.  Neurological:     Mental Status: She is alert and oriented to person, place, and time.  Psychiatric:        Mood and Affect: Affect normal.    LABORATORY DATA:  I have reviewed the data as listed    Component Value Date/Time   NA 139 03/05/2024 0957   NA 141 01/25/2020 1530   K 3.4 (L) 03/05/2024 0957   CL 110 03/05/2024 0957   CO2 20 (L) 03/05/2024 0957   GLUCOSE 94 03/05/2024 0957   BUN 16 03/05/2024 0957   BUN 19 01/25/2020 1530   CREATININE 0.81 03/05/2024 0957   CALCIUM  8.3 (L) 03/05/2024 0957   PROT 6.7 03/05/2024 0957   ALBUMIN 3.7 03/05/2024 0957   AST 17 03/05/2024 0957   ALT 15 03/05/2024 0957   ALKPHOS 66 03/05/2024 0957   BILITOT 0.6 03/05/2024 0957   GFRNONAA >60 03/05/2024 0957   GFRAA NOT CALCULATED 04/17/2020 1352    No results found for: SPEP, UPEP  Lab Results  Component Value Date   WBC 4.6 03/05/2024   NEUTROABS 2.4 03/05/2024   HGB 12.2 03/05/2024   HCT 36.3 03/05/2024   MCV 89.6 03/05/2024   PLT 163 03/05/2024      Chemistry      Component Value Date/Time   NA 139 03/05/2024 0957   NA 141 01/25/2020 1530   K 3.4 (L) 03/05/2024 0957   CL 110 03/05/2024 0957   CO2 20 (L) 03/05/2024 0957   BUN 16 03/05/2024 0957   BUN  19 01/25/2020 1530   CREATININE 0.81 03/05/2024  0957      Component Value Date/Time   CALCIUM  8.3 (L) 03/05/2024 0957   ALKPHOS 66 03/05/2024 0957   AST 17 03/05/2024 0957   ALT 15 03/05/2024 0957   BILITOT 0.6 03/05/2024 0957      (H): Data is abnormally high   Latest Reference Range & Units Most Recent 12/24/20 14:01 04/01/21 14:34 04/28/21 09:17 06/11/21 09:42 07/03/21 10:32 08/20/21 10:06  M Protein SerPl Elph-Mcnc Not Observed g/dL 1.1 (H) (C) 8/73/76 89:93 Not Observed (C) 0.6 (H) (C) 0.7 (H) (C) 0.5 (H) (C) 0.7 (H) (C) 1.1 (H) (C)    Latest Reference Range & Units Most Recent 09/17/21 08:47 10/15/21 08:25 11/12/21 08:55  Kappa free light chain 3.3 - 19.4 mg/L 5.0 11/12/21 08:55 7.2 7.2 5.0  Lambda free light chains 5.7 - 26.3 mg/L 223.6 (H) 11/12/21 08:55 215.3 (H) 340.2 (H) 223.6 (H)  Kappa, lambda light chain ratio 0.26 - 1.65  0.02 (L) 11/12/21 08:55 0.03 (L) 0.02 (L) 0.02 (L)  (H): Data is abnormally high (L): Data is abnormally low  RADIOGRAPHIC STUDIES: I have personally reviewed the radiological images as listed and agreed with the findings in the report. No results found.   ASSESSMENT & PLAN:  Multiple myeloma not having achieved remission (HCC) # RECURRENT Multiple myeloma stage III [high-risk cytogenetics-status post KRD-VGPR ; status post autologous stem cell transplant on 06/15/18.   MAY 2024- 1.3. ]  currently on Isa-Dex; d-2 carfil weekly q 28 days. # MARCH 2025- M-protein -0.3 ; kappa lambda light chain ratio-1.8 lambda light chain 63-slowly RISING FEB 2025- Ejection fraction improved. However chemotherapy was interrupted-because of thigh infection needing wound VAC.  Last treatment was approximately 3 months ago in  May 2025.  # S/p  re-evaluation at [facility] in June 2025- Labs c/w early progression. No evidence of end organ damage, will repeat PET/CT- bone marrow in next 1-2 months based on the response to MM protein. Monitor for now closely- will order PET at next visit- after cycle #2  # For now proceed  with with  Isa-Dex-Carfil- Labs-CBC/chemistries were reviewed with the patient.  Ordered 2 d echo.   # DROP in EF:  MARCH 2024-Echo [[facility]] Left ventricular EF 50 to 55%; AUG 13th/OCT 23rd, 2024- EF-45-50%.  FEB 26th, 2025- Left ventricular ejection fraction, by estimation, is 50 to 55%. The left ventricle has low normal function Recommend monitoring for now. Stable.   # Anemia- JAn 2025- I ron sat- 8%; ferritin- 19 on prn venofer - Stable.   # Electrolyte: Hypokalemia: mild-  continue compliance Kdur TID-; hypocalcemia- 8.0- vit D SEP 2024- 50- continie Ergo 50k weekly. continue ca BID    sep 2024- Vit D 54. Stable.   # Iatrogenic hypothyroidism [ Graves/goiter s/p total thyroidectomy;aug,2022 ]-currently on Synthroid  150 mcg/day-[increased in AUG, 2024- Stable.    # MAY 2025- thigh infection s/p wound VAC [Dr.Rodenberg]  proceed witjh IVIG next week. .   # Glaucoma BIL- L > R [Dr.King; [facility]] - ? Steroid induced on eye drops. decreased the steroids.  Discussed with ophthalmology- monitor for now. stable  # Lower back discomfort x 6 months-see above.  MRI lumbar spine as above.  Continue tramadol - refilled . stable  # Bone lesions /last zometa  on 11/27/2017.  S/p   Dental extraction PET scan evidence of any bone lesions.- consider Zometa   at next visit.   # Infection prophylaxis: Acyclovir ; asprin- add IVIG infusion [IgG <  400]- plan  IVIG- q 4 w #1 on- 9/17- Stable.   # IV access: mediport- functional  # ACP: Patient has  incurable cancer.  However patient is currently doing well. Patient interested in continue current scope of therapy; and full code.  6 w- given dental extrac- on 1/26. :MM panel; K/l light chain ratio q 4 W  ; NO zometa - dental issues-ok; but hypocalemia  Starting aug 25th- q2w-MD: D-isa-dex;car1&2-car; weekly x 3; week 4-OFF;  q 28 day cycle PS:  # DISPOSITION: # ADD TSH # order standing orders for 4 weeks- MM panel/k-l light chains- # weekly port-cbc/cmp #  IVIG infusion in 1 week # chemo today; and tomorrow.  # IVIG infusion in 5 week # follow up as per IS-  Dr.B     Orders Placed This Encounter  Procedures   TSH    Standing Status:   Future    Number of Occurrences:   1    Expected Date:   03/05/2024    Expiration Date:   03/05/2025   CBC with Differential (Cancer Center Only)    Standing Status:   Standing    Number of Occurrences:   10    Expiration Date:   03/05/2025   CMP (Cancer Center only)    Standing Status:   Standing    Number of Occurrences:   10    Expiration Date:   03/05/2025   All questions were answered. The patient knows to call the clinic with any problems, questions or concerns.      Cindy JONELLE Joe, MD 03/05/2024 10:46 AM End

## 2024-03-05 NOTE — Assessment & Plan Note (Addendum)
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   MAY 2024- 1.3. ]  currently on Isa-Dex; d-2 carfil weekly q 28 days. # MARCH 2025- M-protein -0.3 ; kappa lambda light chain ratio-1.8 lambda light chain 63-slowly RISING FEB 2025- Ejection fraction improved. However chemotherapy was interrupted-because of thigh infection needing wound VAC.  Last treatment was approximately 3 months ago in  May 2025.  # S/p  re-evaluation at [facility] in June 2025- Labs c/w early progression. No evidence of end organ damage, will repeat PET/CT- bone marrow in next 1-2 months based on the response to MM protein. Monitor for now closely- will order PET at next visit- after cycle #2  # For now proceed with with  Isa-Dex-Carfil- Labs-CBC/chemistries were reviewed with the patient.  Ordered 2 d echo.   # DROP in EF:  MARCH 2024-Echo [[facility]] Left ventricular EF 50 to 55%; AUG 13th/OCT 23rd, 2024- EF-45-50%.  FEB 26th, 2025- Left ventricular ejection fraction, by estimation, is 50 to 55%. The left ventricle has low normal function Recommend monitoring for now. Stable.   # Anemia- JAn 2025- I ron sat- 8%; ferritin- 19 on prn venofer - Stable.   # Electrolyte: Hypokalemia: mild-  continue compliance Kdur TID-; hypocalcemia- 8.0- vit D SEP 2024- 50- continie Ergo 50k weekly. continue ca BID    sep 2024- Vit D 54. Stable.   # Iatrogenic hypothyroidism [ Graves/goiter s/p total thyroidectomy;aug,2022 ]-currently on Synthroid  150 mcg/day-[increased in AUG, 2024- Stable.    # MAY 2025- thigh infection s/p wound VAC [Dr.Rodenberg]  proceed witjh IVIG next week. .   # Glaucoma BIL- L > R [Dr.King; [facility]] - ? Steroid induced on eye drops. decreased the steroids.  Discussed with ophthalmology- monitor for now. stable  # Lower back discomfort x 6 months-see above.  MRI lumbar spine as above.  Continue tramadol - refilled . stable  # Bone  lesions /last zometa  on 11/27/2017.  S/p   Dental extraction PET scan evidence of any bone lesions.- consider Zometa   at next visit.   # Infection prophylaxis: Acyclovir ; asprin- add IVIG infusion [IgG < 400]- plan  IVIG- q 4 w #1 on- 9/17- Stable.   # IV access: mediport- functional  # ACP: Patient has  incurable cancer.  However patient is currently doing well. Patient interested in continue current scope of therapy; and full code.  6 w- given dental extrac- on 1/26. :MM panel; K/l light chain ratio q 4 W  ; NO zometa - dental issues-ok; but hypocalemia  Starting aug 25th- q2w-MD: D-isa-dex;car1&2-car; weekly x 3; week 4-OFF;  q 28 day cycle PS:  # DISPOSITION: # ADD TSH # order standing orders for 4 weeks- MM panel/k-l light chains- # weekly port-cbc/cmp # IVIG infusion in 1 week # chemo today; and tomorrow.  # IVIG infusion in 5 week # follow up as per IS-  Dr.B

## 2024-03-05 NOTE — Patient Instructions (Signed)
 [facility] - A DEPT OF [facility]  Discharge Instructions: Thank you for choosing [facility] to provide your oncology and hematology care.  If you have a lab appointment with the Cancer Center, please go directly to the Cancer Center and check in at the registration area.  Wear comfortable clothing and clothing appropriate for easy access to any Portacath or PICC line.   We strive to give you quality time with your provider. You may need to reschedule your appointment if you arrive late (15 or more minutes).  Arriving late affects you and other patients whose appointments are after yours.  Also, if you miss three or more appointments without notifying the office, you may be dismissed from the clinic at the provider's discretion.      For prescription refill requests, have your pharmacy contact our office and allow 72 hours for refills to be completed.    Today you received the following chemotherapy and/or immunotherapy agents Kyprolis  & Sarclisa     To help prevent nausea and vomiting after your treatment, we encourage you to take your nausea medication as directed.  BELOW ARE SYMPTOMS THAT SHOULD BE REPORTED IMMEDIATELY: *FEVER GREATER THAN 100.4 F (38 C) OR HIGHER *CHILLS OR SWEATING *NAUSEA AND VOMITING THAT IS NOT CONTROLLED WITH YOUR NAUSEA MEDICATION *UNUSUAL SHORTNESS OF BREATH *UNUSUAL BRUISING OR BLEEDING *URINARY PROBLEMS (pain or burning when urinating, or frequent urination) *BOWEL PROBLEMS (unusual diarrhea, constipation, pain near the anus) TENDERNESS IN MOUTH AND THROAT WITH OR WITHOUT PRESENCE OF ULCERS (sore throat, sores in mouth, or a toothache) UNUSUAL RASH, SWELLING OR PAIN  UNUSUAL VAGINAL DISCHARGE OR ITCHING   Items with * indicate a potential emergency and should be followed up as soon as possible or go to the Emergency Department if any problems should occur.  Please show the CHEMOTHERAPY ALERT CARD or  IMMUNOTHERAPY ALERT CARD at check-in to the Emergency Department and triage nurse.  Should you have questions after your visit or need to cancel or reschedule your appointment, please contact [facility] - A DEPT OF [facility]  [phone] and follow the prompts.  Office hours are 8:00 a.m. to 4:30 p.m. Monday - Friday. Please note that voicemails left after 4:00 p.m. may not be returned until the following business day.  We are closed weekends and major holidays. You have access to a nurse at all times for urgent questions. Please call the main number to the clinic [phone] and follow the prompts.  For any non-urgent questions, you may also contact your provider using MyChart. We now offer e-Visits for anyone 2 and older to request care online for non-urgent symptoms. For details visit [email or web address].   Also download the MyChart app! Go to the app store, search MyChart, open the app, select [place], and log in with your MyChart username and password.

## 2024-03-05 NOTE — Progress Notes (Signed)
 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 Thelbert in [place], and [facility].  Nifedipine added by pcp for BP.

## 2024-03-06 ENCOUNTER — Inpatient Hospital Stay

## 2024-03-06 ENCOUNTER — Other Ambulatory Visit: Payer: Self-pay

## 2024-03-06 VITALS — BP 162/81 | HR 52 | Temp 99.0°F | Resp 18

## 2024-03-06 DIAGNOSIS — Z5112 Encounter for antineoplastic immunotherapy: Secondary | ICD-10-CM | POA: Diagnosis not present

## 2024-03-06 DIAGNOSIS — C9000 Multiple myeloma not having achieved remission: Secondary | ICD-10-CM

## 2024-03-06 MED ORDER — SODIUM CHLORIDE 0.9 % IV SOLN
Freq: Once | INTRAVENOUS | Status: AC
Start: 2024-03-06 — End: 2024-03-06
  Filled 2024-03-06: qty 250

## 2024-03-06 MED ORDER — DEXAMETHASONE SODIUM PHOSPHATE 10 MG/ML IJ SOLN
5.0000 mg | Freq: Once | INTRAMUSCULAR | Status: AC
  Administered 2024-03-06 (×2): 5 mg via INTRAVENOUS
  Filled 2024-03-06: qty 1

## 2024-03-06 MED ORDER — SODIUM CHLORIDE 0.9 % IV SOLN
Freq: Once | INTRAVENOUS | Status: AC
  Filled 2024-03-06: qty 250

## 2024-03-06 MED ORDER — DEXTROSE 5 % IV SOLN
56.0000 mg/m2 | Freq: Once | INTRAVENOUS | Status: AC
  Administered 2024-03-06 (×2): 100 mg via INTRAVENOUS
  Filled 2024-03-06: qty 30

## 2024-03-14 ENCOUNTER — Ambulatory Visit

## 2024-03-14 ENCOUNTER — Ambulatory Visit (HOSPITAL_COMMUNITY)

## 2024-03-14 VITALS — BP 121/68 | HR 72 | Temp 96.8°F | Resp 18 | Ht 62.0 in | Wt 152.8 lb

## 2024-03-14 DIAGNOSIS — C9000 Multiple myeloma not having achieved remission: Secondary | ICD-10-CM

## 2024-03-14 DIAGNOSIS — Z5112 Encounter for antineoplastic immunotherapy: Secondary | ICD-10-CM | POA: Diagnosis not present

## 2024-03-14 MED ORDER — IMMUNE GLOBULIN (HUMAN) 10 GM/100ML IV SOLN
400.0000 mg/kg | Freq: Once | INTRAVENOUS | Status: AC
  Administered 2024-03-14: 25 g via INTRAVENOUS
  Filled 2024-03-14: qty 50

## 2024-03-14 MED ORDER — DIPHENHYDRAMINE HCL 25 MG PO CAPS
25.0000 mg | ORAL_CAPSULE | Freq: Once | ORAL | Status: AC
  Administered 2024-03-14: 25 mg via ORAL
  Filled 2024-03-14: qty 1

## 2024-03-14 MED ORDER — ACETAMINOPHEN 325 MG PO TABS
650.0000 mg | ORAL_TABLET | Freq: Once | ORAL | Status: AC
Start: 2024-03-14 — End: 2024-03-14
  Administered 2024-03-14: 650 mg via ORAL
  Filled 2024-03-14: qty 2

## 2024-03-14 MED ORDER — DEXTROSE 5 % IV SOLN
Freq: Once | INTRAVENOUS | Status: AC
  Filled 2024-03-14: qty 250

## 2024-03-14 NOTE — Patient Instructions (Signed)
 Immune Globulin  Injection What is this medication? IMMUNE GLOBULIN  (im MUNE GLOB yoo lin) treats many immune system conditions. It works by Designer, multimedia extra antibodies. Antibodies are proteins made by the immune system that help protect the body. This medicine may be used for other purposes; ask your health care provider or pharmacist if you have questions. COMMON BRAND NAME(S): ASCENIV, Baygam, BIVIGAM, Carimune, Carimune NF, cutaquig, Cuvitru, Flebogamma, Flebogamma DIF, GamaSTAN, GamaSTAN S/D, Gamimune N, Gammagard, Gammagard S/D, Gammaked, Gammaplex, Gammar-P IV, Gamunex, Gamunex-C, Hizentra, Iveegam, Iveegam EN, Octagam, Panglobulin, Panglobulin NF, panzyga, Polygam S/D, Privigen , Sandoglobulin, Venoglobulin-S, Vigam, Vivaglobulin, Xembify What should I tell my care team before I take this medication? They need to know if you have any of these conditions: Blood clotting disorder Condition where you have excess fluid in your body, such as heart failure or edema Dehydration Diabetes Have had blood clots Heart disease Immune system conditions Kidney disease Low levels of IgA Recent or upcoming vaccine An unusual or allergic reaction to immune globulin , other medications, foods, dyes, or preservatives Pregnant or trying to get pregnant Breastfeeding How should I use this medication? This medication is infused into a vein or under the skin. It may also be injected into a muscle. It is usually given by your care team in a hospital or clinic setting. It may also be given at home. If you get this medication at home, you will be taught how to prepare and give it. Take it as directed on the prescription label. Keep taking it unless your care team tells you to stop. It is important that you put your used needles and syringes in a special sharps container. Do not put them in a trash can. If you do not have a sharps container, call your pharmacist or care team to get one. Talk to your care team  about the use of this medication in children. While it may be given to children for selected conditions, precautions do apply. Overdosage: If you think you have taken too much of this medicine contact a poison control center or emergency room at once. NOTE: This medicine is only for you. Do not share this medicine with others. What if I miss a dose? If you get this medication at the hospital or clinic: It is important not to miss your dose. Call your care team if you are unable to keep an appointment. If you give yourself this medication at home: If you miss a dose, take it as soon as you can. Then continue your normal schedule. If it is almost time for your next dose, take only that dose. Do not take double or extra doses. Call your care team with questions. What may interact with this medication? Live virus vaccines This list may not describe all possible interactions. Give your health care provider a list of all the medicines, herbs, non-prescription drugs, or dietary supplements you use. Also tell them if you smoke, drink alcohol , or use illegal drugs. Some items may interact with your medicine. What should I watch for while using this medication? Your condition will be monitored carefully while you are receiving this medication. Tell your care team if your symptoms do not start to get better or if they get worse. You may need blood work done while you are taking this medication. This medication increases the risk of blood clots. People with heart, blood vessel, or blood clotting conditions are more likely to develop a blood clot. Other risk factors include advanced age, estrogen use, tobacco  use, lack of movement, and being overweight. This medication can decrease the response to a vaccine. If you need to get vaccinated, tell your care team if you have received this medication within the last year. Extra booster doses may be needed. Talk to your care team to see if a different vaccination schedule  is needed. This medication is made from donated human blood. There is a small risk it may contain bacteria or viruses, such as hepatitis or HIV. All products are processed to kill most bacteria and viruses. Talk to your care team if you have questions about the risk of infection. If you have diabetes, talk to your care team about which device you should use to check your blood sugar. This medication may cause some devices to report falsely high blood sugar levels. This may cause you to react by not treating a low blood sugar level or by giving an insulin  dose that was not needed. This can cause severe low blood sugar levels. What side effects may I notice from receiving this medication? Side effects that you should report to your care team as soon as possible: Allergic reactions--skin rash, itching, hives, swelling of the face, lips, tongue, or throat Blood clot--pain, swelling, or warmth in the leg, shortness of breath, chest pain Fever, neck pain or stiffness, sensitivity to light, headache, nausea, vomiting, confusion, which may be signs of meningitis Hemolytic anemia--unusual weakness or fatigue, dizziness, headache, trouble breathing, dark urine, yellowing skin or eyes Kidney injury--decrease in the amount of urine, swelling of the ankles, hands, or feet Low sodium level--muscle weakness, fatigue, dizziness, headache, confusion Shortness of breath or trouble breathing, cough, unusual weakness or fatigue, blue skin or lips Side effects that usually do not require medical attention (report these to your care team if they continue or are bothersome): Chills Diarrhea Fever Headache Nausea This list may not describe all possible side effects. Call your doctor for medical advice about side effects. You may report side effects to FDA at 1-800-FDA-1088. Where should I keep my medication? Keep out of the reach of children and pets. You will be instructed on how to store this medication. Get rid of  any unused medication after the expiration date. To get rid of medications that are no longer needed or have expired: Take the medication to a medication take-back program. Check with your pharmacy or law enforcement to find a location. If you cannot return the medication, ask your pharmacist or care team how to get rid of this medication safely. NOTE: This sheet is a summary. It may not cover all possible information. If you have questions about this medicine, talk to your doctor, pharmacist, or health care provider.  2025 Elsevier/Gold Standard (2023-09-26 00:00:00)

## 2024-03-15 ENCOUNTER — Encounter: Payer: Self-pay | Admitting: Internal Medicine

## 2024-03-15 ENCOUNTER — Other Ambulatory Visit: Payer: Self-pay

## 2024-03-16 IMAGING — CR DG CHEST 2V
2 series · 2 of 2 positions shown · non-contrast
Comparison: Prior chest radiographs 09/13/2021 and earlier. CT
chest 05/24/2021.

CLINICAL DATA: Provided history: Cough.  Cough and congestion.

EXAM:
CHEST - 2 VIEW

[chest pa]
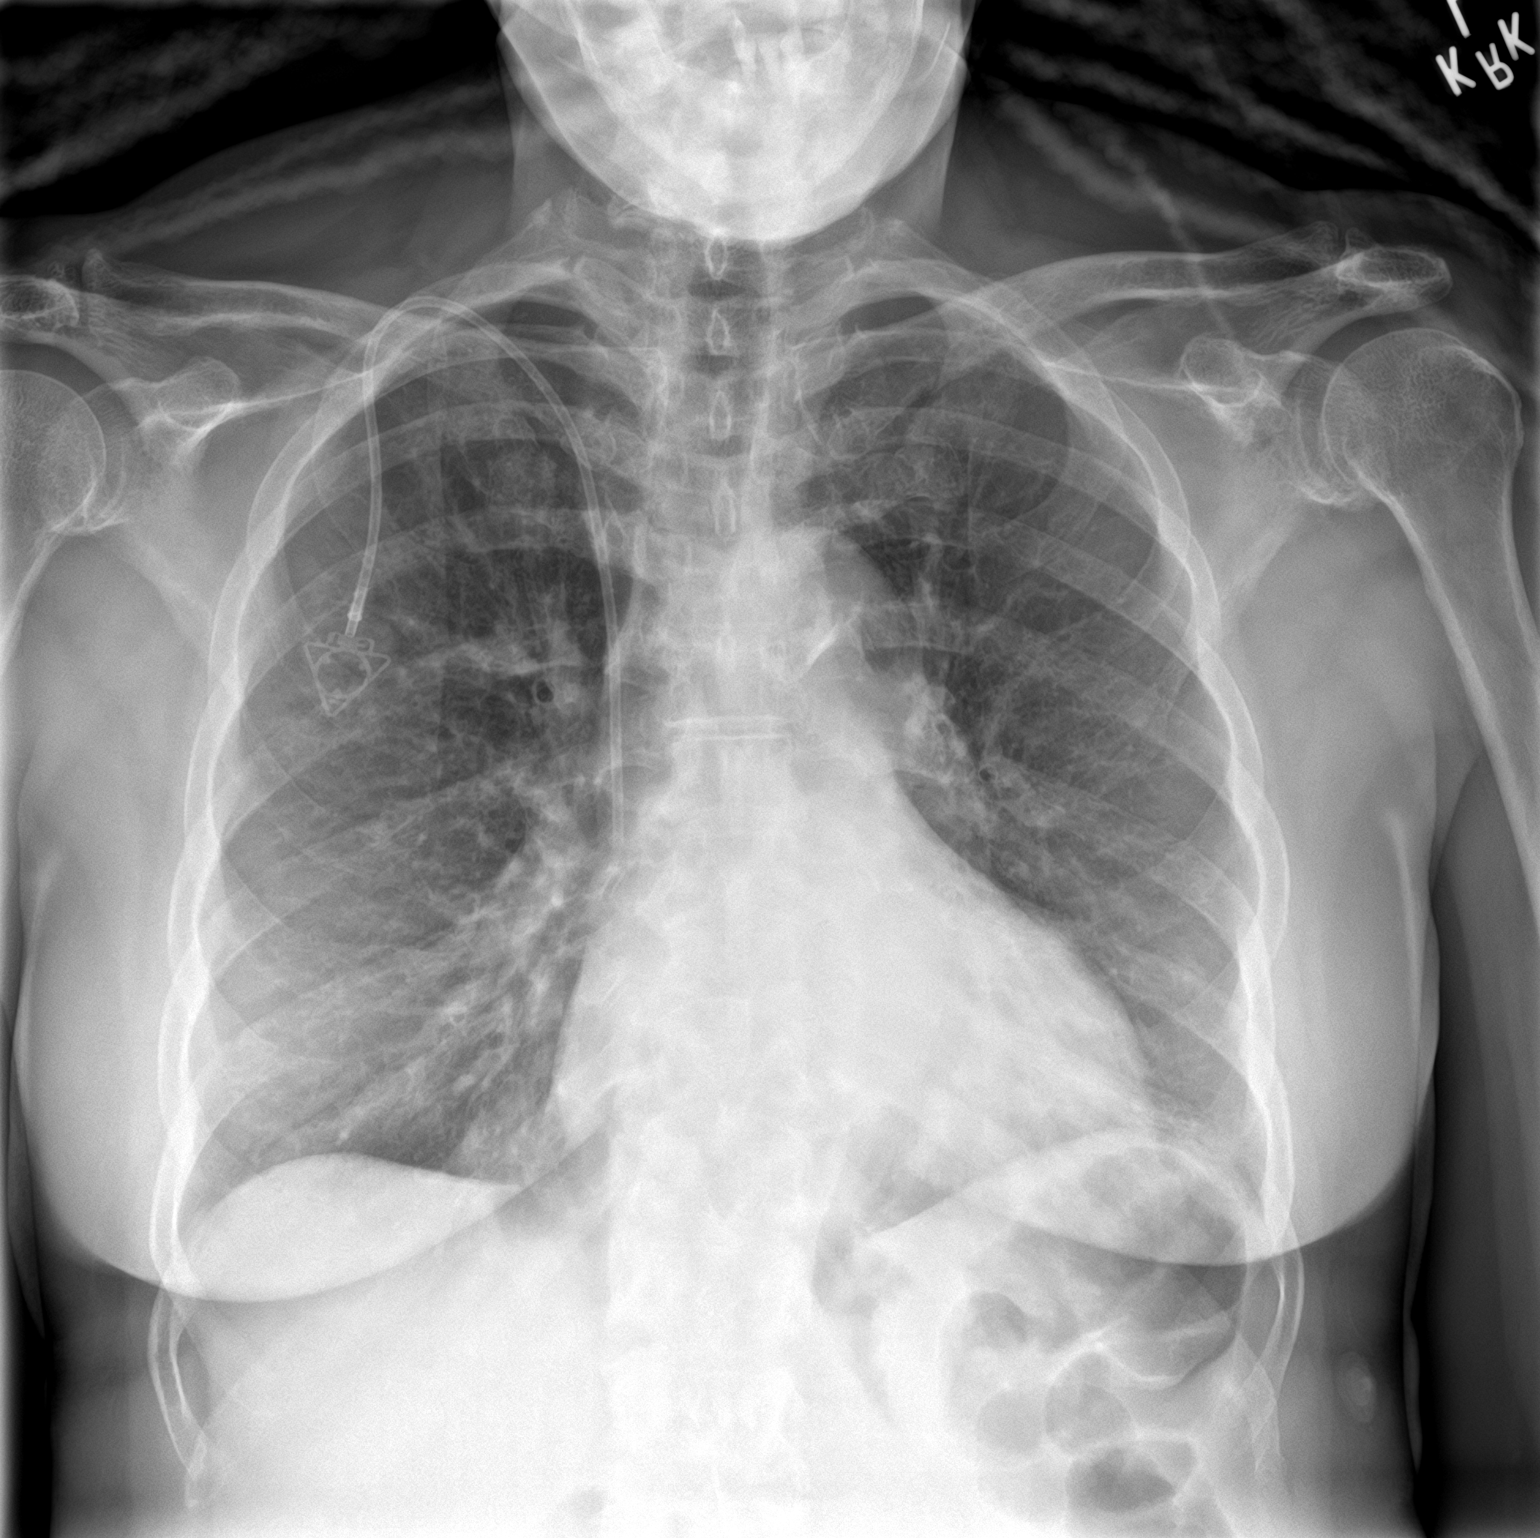

[chest lat]
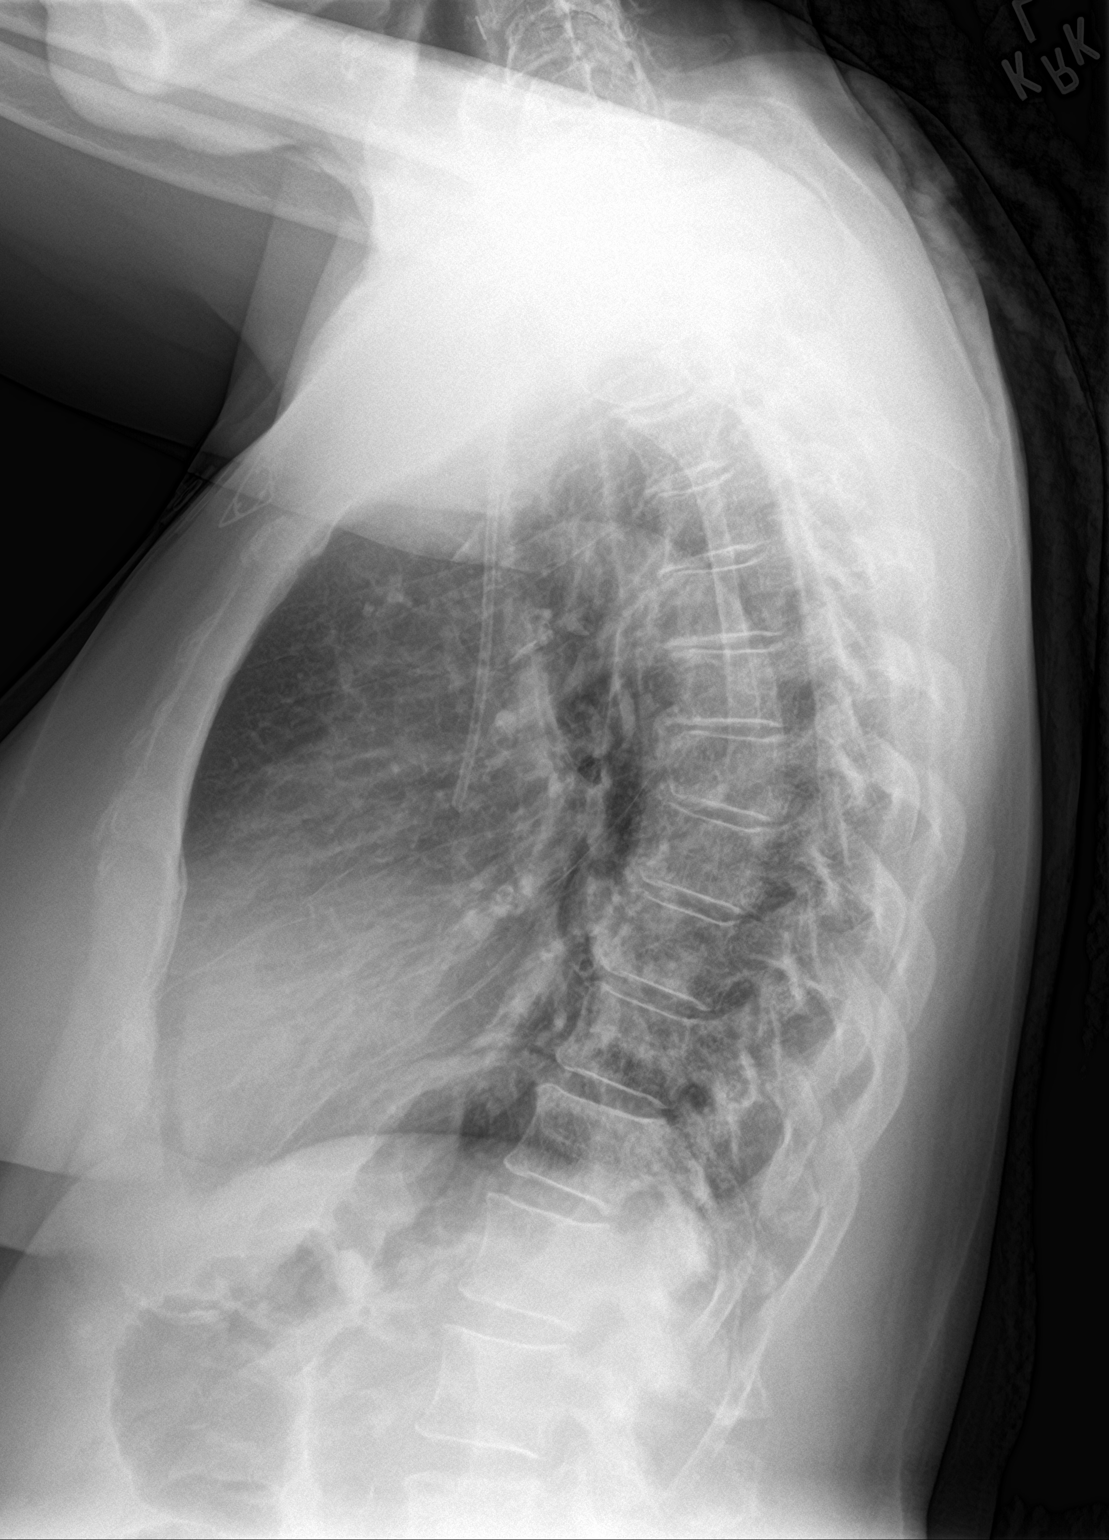

[2 of 2 positions shown; findings below may reference images not displayed]

FINDINGS: Right chest infusion port catheter with tip projecting at the level
of the superior cavoatrial junction. Heart size within normal
limits. Aortic atherosclerosis. Redemonstrated chronic peribronchial
thickening suggesting chronic bronchitis. Minimal atelectasis within
the left lung base. No appreciable airspace consolidation or
pulmonary edema. No evidence of pleural effusion or pneumothorax. No
acute bony abnormality identified.
IMPRESSION: No appreciable airspace consolidation or pulmonary edema.

Minimal atelectasis within the left lung base.

Chronic peribronchial thickening suggesting chronic bronchitis.

Aortic Atherosclerosis ([id number]).

## 2024-03-19 ENCOUNTER — Encounter: Payer: Self-pay | Admitting: Internal Medicine

## 2024-03-19 ENCOUNTER — Inpatient Hospital Stay

## 2024-03-19 ENCOUNTER — Inpatient Hospital Stay (HOSPITAL_BASED_OUTPATIENT_CLINIC_OR_DEPARTMENT_OTHER): Admitting: Internal Medicine

## 2024-03-19 VITALS — BP 126/64 | HR 62 | Temp 98.0°F | Resp 19

## 2024-03-19 VITALS — BP 137/94 | HR 76 | Temp 97.3°F | Resp 20 | Ht 62.0 in | Wt 147.8 lb

## 2024-03-19 DIAGNOSIS — C9000 Multiple myeloma not having achieved remission: Secondary | ICD-10-CM | POA: Diagnosis not present

## 2024-03-19 DIAGNOSIS — Z5112 Encounter for antineoplastic immunotherapy: Secondary | ICD-10-CM | POA: Diagnosis not present

## 2024-03-19 LAB — CMP (CANCER CENTER ONLY)
ALT: 9 U/L (ref 0–44)
AST: 16 U/L (ref 15–41)
Albumin: 4.6 g/dL (ref 3.5–5.0)
Alkaline Phosphatase: 71 U/L (ref 38–126)
Anion gap: 9 (ref 5–15)
BUN: 19 mg/dL (ref 8–23)
CO2: 20 mmol/L — ABNORMAL LOW (ref 22–32)
Calcium: 8.8 mg/dL — ABNORMAL LOW (ref 8.9–10.3)
Chloride: 108 mmol/L (ref 98–111)
Creatinine: 0.71 mg/dL (ref 0.44–1.00)
GFR, Estimated: >60 mL/min (ref >60)
Glucose, Bld: 114 mg/dL — ABNORMAL HIGH (ref 70–99)
Potassium: 3.6 mmol/L (ref 3.5–5.1)
Sodium: 137 mmol/L (ref 135–145)
Total Bilirubin: 0.7 mg/dL (ref 0.0–1.2)
Total Protein: 8.2 g/dL — ABNORMAL HIGH (ref 6.5–8.1)

## 2024-03-19 LAB — CBC WITH DIFFERENTIAL (CANCER CENTER ONLY)
Abs Immature Granulocytes: 0.01 K/uL (ref 0.00–0.07)
Basophils Absolute: 0 K/uL (ref 0.0–0.1)
Basophils Relative: 1 %
Eosinophils Absolute: 0 K/uL (ref 0.0–0.5)
Eosinophils Relative: 1 %
HCT: 40.1 % (ref 36.0–46.0)
Hemoglobin: 13.2 g/dL (ref 12.0–15.0)
Immature Granulocytes: 0 %
Lymphocytes Relative: 60 %
Lymphs Abs: 2.2 K/uL (ref 0.7–4.0)
MCH: 29.5 pg (ref 26.0–34.0)
MCHC: 32.9 g/dL (ref 30.0–36.0)
MCV: 89.7 fL (ref 80.0–100.0)
Monocytes Absolute: 0.4 K/uL (ref 0.1–1.0)
Monocytes Relative: 12 %
Neutro Abs: 0.9 K/uL — ABNORMAL LOW (ref 1.7–7.7)
Neutrophils Relative %: 26 %
Platelet Count: 258 K/uL (ref 150–400)
RBC: 4.47 MIL/uL (ref 3.87–5.11)
RDW: 13.9 % (ref 11.5–15.5)
Smear Review: NORMAL
WBC Count: 3.7 K/uL — ABNORMAL LOW (ref 4.0–10.5)
nRBC: 0 % (ref 0.0–0.2)

## 2024-03-19 MED ORDER — DEXAMETHASONE SODIUM PHOSPHATE 10 MG/ML IJ SOLN
5.0000 mg | Freq: Once | INTRAMUSCULAR | Status: AC
  Administered 2024-03-19: 5 mg via INTRAVENOUS
  Filled 2024-03-19: qty 1

## 2024-03-19 MED ORDER — SODIUM CHLORIDE 0.9 % IV SOLN
Freq: Once | INTRAVENOUS | Status: AC
  Filled 2024-03-19: qty 250

## 2024-03-19 MED ORDER — ACETAMINOPHEN 325 MG PO TABS
650.0000 mg | ORAL_TABLET | Freq: Once | ORAL | Status: AC
  Administered 2024-03-19: 650 mg via ORAL
  Filled 2024-03-19: qty 2

## 2024-03-19 MED ORDER — DIPHENHYDRAMINE HCL 50 MG/ML IJ SOLN
50.0000 mg | Freq: Once | INTRAMUSCULAR | Status: AC
  Administered 2024-03-19: 50 mg via INTRAVENOUS
  Filled 2024-03-19: qty 1

## 2024-03-19 MED ORDER — FAMOTIDINE IN NACL 20-0.9 MG/50ML-% IV SOLN
20.0000 mg | Freq: Once | INTRAVENOUS | Status: AC
  Administered 2024-03-19: 20 mg via INTRAVENOUS
  Filled 2024-03-19: qty 50

## 2024-03-19 MED ORDER — SODIUM CHLORIDE 0.9 % IV SOLN
10.0000 mg/kg | Freq: Once | INTRAVENOUS | Status: AC
  Administered 2024-03-19: 700 mg via INTRAVENOUS
  Filled 2024-03-19: qty 25

## 2024-03-19 MED ORDER — DEXTROSE 5 % IV SOLN
56.0000 mg/m2 | Freq: Once | INTRAVENOUS | Status: AC
  Administered 2024-03-19: 100 mg via INTRAVENOUS
  Filled 2024-03-19: qty 30

## 2024-03-19 NOTE — Progress Notes (Signed)
 Patient has no concerns

## 2024-03-19 NOTE — Assessment & Plan Note (Addendum)
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   MAY 2024- 1.3. ]  currently on Isa-Dex; d-2 carfil weekly q 28 days. # MARCH 2025- M-protein -0.3 ; kappa lambda light chain ratio-1.8 lambda light chain 63-slowly RISING FEB 2025- Ejection fraction improved.  However chemotherapy was interrupted-because of thigh infection needing wound VAC.  Last treatment was approximately 3 months ago in  May 2025.  # S/p  re-evaluation at [facility] in June 2025- Labs c/w early progression. No evidence of end organ damage, will repeat PET/CT- bone marrow in next 1-2 months based on the response to MM protein. Monitor for now closely- will order PET today-after cycle #2  # For now proceed with with  Isa-Dex-Carfil- Labs-CBC/chemistries were reviewed with the patient.     # DROP in EF:  MARCH 2024-Echo [[facility]] Left ventricular EF 50 to 55%; AUG 13th/OCT 23rd, 2024- EF-45-50%.  FEB 26th, 2025- Left ventricular ejection fraction, by estimation, is 50 to 55%. ECHO-pending 8/29  # Anemia- JAn 2025- I ron sat- 8%; ferritin- 19 on prn venofer - Stable.   # Electrolyte: Hypokalemia: mild-  continue compliance Kdur TID-; hypocalcemia- 8.0-    # Iatrogenic hypothyroidism [ Graves/goiter s/p total thyroidectomy;aug,2022 ]-currently on Synthroid  175 mcg/day-- elevated TSH- recommend spacing other medications.   # MAY 2025- thigh infection s/p wound VAC [Dr.Rodenberg]  proceed witjh IVIG next week. .   # Glaucoma BIL- L > R [Dr.King; [facility]] - ? Steroid induced on eye drops. decreased the steroids.  Reached out to Dr.Haines, ophthalmology.   # Lower back discomfort x 6 months-see above.  MRI lumbar spine as above.  Continue tramadol - refilled . stable  # Bone lesions /last zometa  on 11/27/2017.  S/p   Dental extraction PET scan evidence of any bone lesions.- consider Zometa   at next visit.   # Infection prophylaxis: Acyclovir ;  asprin- add IVIG infusion [IgG < 400]- plan  IVIG- q 4 w #1 on- 9/17- Stable.   # IV access: mediport- functional  # ACP: Patient has  incurable cancer.  However patient is currently doing well. Patient interested in continue current scope of therapy; and full code.  6 w- given dental extrac- on 1/26. :MM panel; K/l light chain ratio q 4 W  ; NO zometa - dental issues-ok; but hypocalemia  Starting aug 25th- q2w-MD: D-isa-dex;car1&2-car; weekly x 3; week 4-OFF;  q 28 day cycle PS:  # DISPOSITION: # order standing orders for 4 weeks- MM panel/k-l light chains- # weekly port-cbc/cmp # follow up as per IS-   # PET scan- in 4 weeks-  # IVIG in 3  weeks-Dr.B

## 2024-03-19 NOTE — Progress Notes (Signed)
 [facility] OFFICE PROGRESS NOTE  Patient Care Team: [place] HERO, MD as PCP - General (Family Medicine) End, Lonni, MD as PCP - Cardiology (Cardiology) Guinevere File, [name] Rennie Cindy SAUNDERS, MD as Consulting Physician (Oncology) Parris Manna, MD as Consulting Physician (Pulmonary Disease) Damian Therisa HERO, MD as Consulting Physician (Endocrinology)   Cancer Staging  Multiple myeloma not having achieved remission [facility]) Staging form: Plasma Cell Myeloma and Plasma Cell Disorders, AJCC 8th Edition - Clinical: No stage assigned - Unsigned - Clinical: No stage assigned - Unsigned   Oncology History Overview Note  # SEP 2018- MULTIPLE MYELOMA IgALamda [2.5 gm/dl; K/L= 05/8700]; STAGE III [beta 2 microglobulin=5.5] [presented with acute renal failure; anemia; NO hypercalcemia; Skeletal survey-Normal]; BMBx- 45% plasma cells; FISH-POSITIVE 11:14 translocation.[STANDARD-high RISK]/cyto-Normal; SEP 2018- PET- L3 posterior element lesion.   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 No morphologic support for residual myeloma disease. Negative for minimal residual disease by MM-MRD flow cytometry; HOLD REVLIMID  [leg swelling]; AUG 2021-PET scan negative for myeloma; continue to hold Revlimid   # MARCH 2021-diastolic congestive heart failure [Dr.End]  # BMBx- OCT 2021- BMBx- 5% plasmacytosis-however this appears to be more polyclonal  rather than monoclonal-not explain patient worsening anemia.   # OCT 2022- 18th- Dara- Rev-Dex; MARCH, 2024- PROGRESSION bone marrow biopsy 70% plasma cells; PET scan no bone lesions-however conus medullaris uptake-MRI lumbar spine-   # MAY 2024- DISCONTINUE carfilzomib -dexamethasone - Pomalyst ; [rising M-protein; MAY 2024- 1.3. ]   # start 12/27/2022 -Isa-Carfil-Dex;  [decrease dexamethasone  20 weekly- starting 05/04/23- sec to glaucoma].   # OCT 24th, 2024- Bone marrow Biopsy: The findings are consistent with  focal minimal residual lambda restricted plasma cell neoplasm- HOLD treatment sec to glaucoma; and drop in EF AUG 2024- 45-50% [March 2024- 50-55%]  # MARCH 16th, 2025- re-started Isa-Carfil-Dex;  #November 2021-hyperthyroidism/goiter- [D.Bennett/Dr.Solum]-methimazole . S/p Thyroidectomy [Dr.Kim; [facility]-AUG 2022]  --------------------------  # 12/12- RIGHT JUGULAR DVT-x 48m on xarelto ; finished April 2020; September 2020-EGD/dysphagia; Dr. Therisa  # Acute renal failure [Dr.Singh; Proteinuria 1.5gm/day ]; acyclovir Mickeal ------------------------------------------------------------------------------------------------------------   DIAGNOSIS: [ ]  MULTIPLE MYELOMA  STAGE: III/HIGH RISK ;GOALS: CONTROL      Multiple myeloma not having achieved remission (HCC)  11/21/2017 - 04/28/2018 Chemotherapy   Patient is on Treatment Plan : MYELOMA SALVAGE Cyclophosphamide  / Carfilzomib  / Dexamethasone  (CCd) q28d     05/12/2021 - 02/25/2022 Chemotherapy   Patient is on Treatment Plan : MYELOMA RELAPSED REFRACTORY Daratumumab  SQ + Lenalidomide  + Dexamethasone  (DaraRd) q28d     05/12/2021 - 07/23/2022 Chemotherapy   Patient is on Treatment Plan : MYELOMA RELAPSED REFRACTORY Daratumumab  SQ + Lenalidomide  + Dexamethasone  (DaraRd) q28d     11/02/2022 - 11/16/2022 Chemotherapy   Patient is on Treatment Plan : MYELOMA RELAPSED/ REFRACTORY Carfilzomib  D1,8,15 (20/27) + Pomalidomide  + Dexamethasone  (KPd) q28d      12/27/2022 -  Chemotherapy   Patient is on Treatment Plan : MYELOMA Carfilzomib  + Dexamethasone  + Isatuximab  q28d      INTERVAL HISTORY: Ambulating  independently.   Alone.  Brandi [name] 73 y.o.  female pleasant patient above history of relapsed multiple myeloma--currently re-started back after 3 months break [JULY 28th, 2025]  Isa- Carfil-Dex  is here for a follow up.   Patient currently being evaluated by ophthalmology for retinal detachment/glaucoma.  No further signs of of infection.  No fevers or chills.. Denies any worsening pain.  Patient taking tramadol  as needed for upper and leg cramps. Mild tingling in feet.  No difficulty breathing. No worsening leg swelling.    Review of Systems  Constitutional:  Positive for malaise/fatigue. Negative for chills, diaphoresis and fever.  HENT:  Negative for nosebleeds and sore throat.   Eyes:  Negative for double vision.  Respiratory:  Negative for hemoptysis.   Cardiovascular:  Negative for chest pain, palpitations and orthopnea.  Gastrointestinal:  Negative for abdominal pain, blood in stool, constipation, heartburn and melena.  Genitourinary:  Negative for dysuria, frequency and urgency.  Musculoskeletal:  Positive for back pain.  Skin: Negative.  Negative for itching and rash.  Neurological:  Negative for dizziness, tingling, focal weakness, weakness and headaches.  Endo/Heme/Allergies:  Does not bruise/bleed easily.  Psychiatric/Behavioral:  Negative for depression. The patient is not nervous/anxious.     PAST MEDICAL HISTORY :  Past Medical History:  Diagnosis Date   (HFpEF) heart failure with preserved ejection fraction (HCC)    a. 06/2020 Echo: EF 60-65%, no rwma, nl RV size/fxn. Triv MR. Triv TR/AI. Mod elev PASP.   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   Open wound of left thigh 12/15/2023   Pneumonia    Red blood cell antibody positive, compatible PRBC difficult to obtain    S/P autologous bone marrow transplantation (HCC)    Sepsis (HCC) 11/25/2023    PAST SURGICAL HISTORY :   Past Surgical History:  Procedure Laterality Date   ANTERIOR VITRECTOMY Left 10/07/2021   Procedure: ANTERIOR VITRECTOMY;  Surgeon: Mittie Gaskin, MD;  Location: [facility];  Service: Ophthalmology;  Laterality: Left;   CATARACT EXTRACTION W/PHACO Left 10/07/2021   Procedure: CATARACT EXTRACTION PHACO AND INTRAOCULAR LENS PLACEMENT (IOC) LEFT VISION BLUE;  Surgeon: Mittie Gaskin, MD;  Location: [facility];  Service: Ophthalmology;  Laterality: Left;  kit for manual incision 14.56 01:21.4   CATARACT EXTRACTION W/PHACO Right 10/21/2021   Procedure: CATARACT EXTRACTION PHACO AND INTRAOCULAR LENS PLACEMENT (IOC) RIGHT 11.12 01:48.1;  Surgeon: Mittie Gaskin, MD;  Location: [facility];  Service: Ophthalmology;  Laterality: Right;   ESOPHAGOGASTRODUODENOSCOPY (EGD) WITH PROPOFOL  N/A 03/30/2019   Procedure: ESOPHAGOGASTRODUODENOSCOPY (EGD) WITH PROPOFOL ;  Surgeon: Therisa Bi, MD;  Location: [facility];  Service: Gastroenterology;  Laterality: N/A;   INCISION AND DRAINAGE ABSCESS Left 12/01/2023   Procedure: INCISION AND DRAINAGE, ABSCESS;  Surgeon: Lane Shope, MD;  Location: [facility] OR[facility];  Service: General;  Laterality: Left;   IR BONE MARROW BIOPSY & ASPIRATION  05/25/2023   IR FLUORO GUIDE PORT INSERTION RIGHT  12/16/2017   IR IMAGING GUIDED PORT INSERTION  06/15/2021   KNEE ARTHROSCOPY Left 09/03/2022   Procedure: ARTHROSCOPY KNEE DEBRIDEMENT;  Surgeon: Leora Lynwood SAUNDERS, MD;  Location: [facility] OR[facility];  Service: Orthopedics;  Laterality: Left;   THYROIDECTOMY N/A     FAMILY HISTORY :  Family History   Problem Relation Age of Onset   Pneumonia Mother    Seizures Father     SOCIAL HISTORY:   Social History   Tobacco Use   Smoking status: Former    Current packs/day: 0.00    Types: Cigarettes    Quit date: 03/18/2021    Years since quitting: 3.0   Smokeless tobacco: Never   Tobacco comments:    2 cigarettes QOD  Vaping Use   Vaping status: Never Used  Substance Use Topics   Alcohol use: No   Drug use: No    ALLERGIES:  has no known allergies.  MEDICATIONS:  Current Outpatient Medications  Medication Sig Dispense Refill   acyclovir  (ZOVIRAX ) 400 MG tablet Take 1 tablet (400 mg total) by mouth 2 (two) times daily. 120 tablet 2   albuterol  (VENTOLIN  HFA) 108 (90 Base) MCG/ACT inhaler Inhale 2 puffs into the lungs every 6 (six) hours as needed for wheezing or shortness of breath. 8 g 3   aspirin  EC 81 MG EC tablet Take 1 tablet (81 mg total) by mouth daily. 90 tablet 3   Brinzolamide -Brimonidine  (SIMBRINZA) 1-0.2 % SUSP Apply 1 drop to eye in the morning and at bedtime. I drop both eyes twice a day     Calcium  Carb-Cholecalciferol 600-10 MG-MCG TABS Take 1 tablet by mouth daily.     calcium  carbonate (CALCIUM  600 HIGH POTENCY) 600 MG TABS tablet Take 2 tablets (1,200 mg total) by mouth 2 (two) times daily. 120 tablet 2   furosemide  (LASIX ) 40 MG tablet Take 1 tablet (40 mg total) by mouth daily as needed for edema or fluid.     levothyroxine  (SYNTHROID ) 175 MCG tablet Take 1 tablet (175 mcg total) by mouth daily. For thyroid  90 tablet 1   lidocaine -prilocaine  (EMLA ) cream Apply 1 application topically as needed. Apply to port and cover with saran wrap 1-2 hours prior to port access 30 g 1   magnesium  oxide (MAG-OX) 400 (240 Mg) MG tablet Take 1 tablet (400 mg total) by mouth 2 (two) times daily. 60 tablet 3   montelukast  (SINGULAIR ) 10 MG tablet Take 1 tablet (10 mg total) by mouth at bedtime. 30 tablet 3   NIFEdipine (ADALAT CC) 30 MG 24 hr tablet Take 30 mg by mouth daily.      pantoprazole  (PROTONIX ) 40 MG tablet Take 1 tablet (40 mg total) by mouth daily. 90 tablet 0   polyethylene glycol (MIRALAX  / GLYCOLAX ) packet Take 17 g by mouth daily as needed for mild constipation. 14 each 0   spironolactone  (ALDACTONE ) 25 MG tablet Take 1 tablet (25 mg total) by mouth daily as needed (edema). 30 tablet 1   traMADol  (ULTRAM ) 50 MG tablet Take 1 tablet (50 mg total) by mouth every 12 (twelve) hours as needed. 60 tablet 0   Vitamin D , Ergocalciferol , (DRISDOL ) 1.25 MG (50000 UNIT) CAPS capsule Take 1 capsule by mouth once a week 12 capsule 0   No current facility-administered medications for this visit.   Facility-Administered Medications Ordered in Other Visits  Medication Dose Route Frequency Provider Last Rate Last Admin   0.9 %  sodium chloride  infusion   Intravenous Once Djuan Talton R, MD       carfilzomib  (KYPROLIS ) 100 mg in dextrose  5 % 100 mL chemo infusion  56 mg/m2 (Treatment Plan Recorded) Intravenous Once Gervis Gaba R, MD       dexamethasone  (DECADRON ) injection 5 mg  5 mg Intravenous Once Peyton Spengler R,  MD       famotidine  (PEPCID ) IVPB 20 mg premix  20 mg Intravenous Once Lynzi Meulemans R, MD 200 mL/hr at 03/19/24 1005 20 mg at 03/19/24 1005   isatuximab -irfc (SARCLISA ) 700 mg in sodium chloride  0.9 % 215 mL (2.8 mg/mL) chemo infusion  10 mg/kg (Treatment Plan Recorded) Intravenous Once Revel Stellmach R, MD        PHYSICAL EXAMINATION:   BP (!) 137/94   Pulse 76   Temp (!) 97.3 F (36.3 C)   Resp 20   Ht 5' 2 (1.575 m)   Wt 147 lb 12.8 oz (67 kg)   SpO2 100%   BMI 27.03 kg/m   Filed Weights   03/19/24 0832  Weight: 147 lb 12.8 oz (67 kg)         Mild leg swelling.   Physical Exam HENT:     Head: Normocephalic and atraumatic.  Eyes:     Pupils: Pupils are equal, round, and reactive to light.  Cardiovascular:     Rate and Rhythm: Normal rate and regular rhythm.  Pulmonary:     Effort: Pulmonary  effort is normal. No respiratory distress.     Breath sounds: Normal breath sounds. No wheezing.  Abdominal:     General: Bowel sounds are normal. There is no distension.     Palpations: Abdomen is soft. There is no mass.     Tenderness: There is no abdominal tenderness. There is no guarding or rebound.  Musculoskeletal:        General: No tenderness. Normal range of motion.     Cervical back: Normal range of motion and neck supple.  Skin:    General: Skin is warm.  Neurological:     Mental Status: She is alert and oriented to person, place, and time.  Psychiatric:        Mood and Affect: Affect normal.    LABORATORY DATA:  I have reviewed the data as listed    Component Value Date/Time   NA 137 03/19/2024 0837   NA 141 01/25/2020 1530   K 3.6 03/19/2024 0837   CL 108 03/19/2024 0837   CO2 20 (L) 03/19/2024 0837   GLUCOSE 114 (H) 03/19/2024 0837   BUN 19 03/19/2024 0837   BUN 19 01/25/2020 1530   CREATININE 0.71 03/19/2024 0837   CALCIUM  8.8 (L) 03/19/2024 0837   PROT 8.2 (H) 03/19/2024 0837   ALBUMIN 4.6 03/19/2024 0837   AST 16 03/19/2024 0837   ALT 9 03/19/2024 0837   ALKPHOS 71 03/19/2024 0837   BILITOT 0.7 03/19/2024 0837   GFRNONAA >60 03/19/2024 0837   GFRAA NOT CALCULATED 04/17/2020 1352    No results found for: SPEP, UPEP  Lab Results  Component Value Date   WBC 3.7 (L) 03/19/2024   NEUTROABS 0.9 (L) 03/19/2024   HGB 13.2 03/19/2024   HCT 40.1 03/19/2024   MCV 89.7 03/19/2024   PLT 258 03/19/2024      Chemistry      Component Value Date/Time   NA 137 03/19/2024 0837   NA 141 01/25/2020 1530   K 3.6 03/19/2024 0837   CL 108 03/19/2024 0837   CO2 20 (L) 03/19/2024 0837   BUN 19 03/19/2024 0837   BUN 19 01/25/2020 1530   CREATININE 0.71 03/19/2024 0837      Component Value Date/Time   CALCIUM  8.8 (L) 03/19/2024 0837   ALKPHOS 71 03/19/2024 0837   AST 16 03/19/2024 0837   ALT 9 03/19/2024 0837  BILITOT 0.7 03/19/2024 0837      (H):  Data is abnormally high   Latest Reference Range & Units Most Recent 12/24/20 14:01 04/01/21 14:34 04/28/21 09:17 06/11/21 09:42 07/03/21 10:32 08/20/21 10:06  M Protein SerPl Elph-Mcnc Not Observed g/dL 1.1 (H) (C) 8/73/76 89:93 Not Observed (C) 0.6 (H) (C) 0.7 (H) (C) 0.5 (H) (C) 0.7 (H) (C) 1.1 (H) (C)    Latest Reference Range & Units Most Recent 09/17/21 08:47 10/15/21 08:25 11/12/21 08:55  Kappa free light chain 3.3 - 19.4 mg/L 5.0 11/12/21 08:55 7.2 7.2 5.0  Lambda free light chains 5.7 - 26.3 mg/L 223.6 (H) 11/12/21 08:55 215.3 (H) 340.2 (H) 223.6 (H)  Kappa, lambda light chain ratio 0.26 - 1.65  0.02 (L) 11/12/21 08:55 0.03 (L) 0.02 (L) 0.02 (L)  (H): Data is abnormally high (L): Data is abnormally low  RADIOGRAPHIC STUDIES: I have personally reviewed the radiological images as listed and agreed with the findings in the report. No results found.   ASSESSMENT & PLAN:  Multiple myeloma not having achieved remission (HCC) # RECURRENT Multiple myeloma stage III [high-risk cytogenetics-status post KRD-VGPR ; status post autologous stem cell transplant on 06/15/18.   MAY 2024- 1.3. ]  currently on Isa-Dex; d-2 carfil weekly q 28 days. # MARCH 2025- M-protein -0.3 ; kappa lambda light chain ratio-1.8 lambda light chain 63-slowly RISING FEB 2025- Ejection fraction improved.  However chemotherapy was interrupted-because of thigh infection needing wound VAC.  Last treatment was approximately 3 months ago in  May 2025.  # S/p  re-evaluation at [facility] in June 2025- Labs c/w early progression. No evidence of end organ damage, will repeat PET/CT- bone marrow in next 1-2 months based on the response to MM protein. Monitor for now closely- will order PET today-after cycle #2  # For now proceed with with  Isa-Dex-Carfil- Labs-CBC/chemistries were reviewed with the patient.     # DROP in EF:  MARCH 2024-Echo [[facility]] Left ventricular EF 50 to 55%; AUG 13th/OCT 23rd, 2024- EF-45-50%.  FEB 26th, 2025-  Left ventricular ejection fraction, by estimation, is 50 to 55%. ECHO-pending 8/29  # Anemia- JAn 2025- I ron sat- 8%; ferritin- 19 on prn venofer - Stable.   # Electrolyte: Hypokalemia: mild-  continue compliance Kdur TID-; hypocalcemia- 8.0-    # Iatrogenic hypothyroidism [ Graves/goiter s/p total thyroidectomy;aug,2022 ]-currently on Synthroid  175 mcg/day-- elevated TSH- recommend spacing other medications.   # MAY 2025- thigh infection s/p wound VAC [Dr.Rodenberg]  proceed witjh IVIG next week. .   # Glaucoma BIL- L > R [Dr.King; [facility]] - ? Steroid induced on eye drops. decreased the steroids.  Reached out to Dr.Haines, ophthalmology.   # Lower back discomfort x 6 months-see above.  MRI lumbar spine as above.  Continue tramadol - refilled . stable  # Bone lesions /last zometa  on 11/27/2017.  S/p   Dental extraction PET scan evidence of any bone lesions.- consider Zometa   at next visit.   # Infection prophylaxis: Acyclovir ; asprin- add IVIG infusion [IgG < 400]- plan  IVIG- q 4 w #1 on- 9/17- Stable.   # IV access: mediport- functional  # ACP: Patient has  incurable cancer.  However patient is currently doing well. Patient interested in continue current scope of therapy; and full code.  6 w- given dental extrac- on 1/26. :MM panel; K/l light chain ratio q 4 W  ; NO zometa - dental issues-ok; but hypocalemia  Starting aug 25th- q2w-MD: D-isa-dex;car1&2-car; weekly x 3; week 4-OFF;  q 28 day cycle PS:  # DISPOSITION: # order standing orders for 4 weeks- MM panel/k-l light chains- # weekly port-cbc/cmp # follow up as per IS-   # PET scan- in 4 weeks-  # IVIG in 3  weeks-Dr.B     Orders Placed This Encounter  Procedures   NM PET Image Restage (PS) Whole Body (F-18 FDG)    Standing Status:   Future    Expected Date:   04/16/2024    Expiration Date:   03/19/2025    If indicated for the ordered procedure, I authorize the administration of a radiopharmaceutical per Radiology  protocol:   Yes    Preferred imaging location?:   [facility]   CBC with Differential (Cancer Center Only)    Standing Status:   Future    Expected Date:   04/17/2024    Expiration Date:   04/17/2025   CMP (Cancer Center only)    Standing Status:   Future    Expected Date:   04/17/2024    Expiration Date:   04/17/2025   CBC with Differential (Cancer Center Only)    Standing Status:   Future    Expected Date:   04/24/2024    Expiration Date:   04/24/2025   CMP (Cancer Center only)    Standing Status:   Future    Expected Date:   04/24/2024    Expiration Date:   04/24/2025   CBC with Differential (Cancer Center Only)    Standing Status:   Future    Expected Date:   05/01/2024    Expiration Date:   05/01/2025   CMP (Cancer Center only)    Standing Status:   Future    Expected Date:   05/01/2024    Expiration Date:   05/01/2025   All questions were answered. The patient knows to call the clinic with any problems, questions or concerns.      Cindy JONELLE Joe, MD 03/19/2024 10:11 AM End

## 2024-03-20 ENCOUNTER — Inpatient Hospital Stay

## 2024-03-20 ENCOUNTER — Other Ambulatory Visit: Payer: Self-pay

## 2024-03-20 VITALS — BP 132/62 | HR 74 | Temp 98.2°F | Resp 16

## 2024-03-20 DIAGNOSIS — C9000 Multiple myeloma not having achieved remission: Secondary | ICD-10-CM

## 2024-03-20 DIAGNOSIS — Z5112 Encounter for antineoplastic immunotherapy: Secondary | ICD-10-CM | POA: Diagnosis not present

## 2024-03-20 MED ORDER — DEXAMETHASONE SODIUM PHOSPHATE 10 MG/ML IJ SOLN
5.0000 mg | Freq: Once | INTRAMUSCULAR | Status: AC
  Administered 2024-03-20: 5 mg via INTRAVENOUS
  Filled 2024-03-20: qty 1

## 2024-03-20 MED ORDER — SODIUM CHLORIDE 0.9 % IV SOLN
Freq: Once | INTRAVENOUS | Status: AC
  Filled 2024-03-20: qty 250

## 2024-03-20 MED ORDER — SODIUM CHLORIDE 0.9 % IV SOLN
Freq: Once | INTRAVENOUS | Status: DC
  Filled 2024-03-20: qty 250

## 2024-03-20 MED ORDER — DEXTROSE 5 % IV SOLN
56.0000 mg/m2 | Freq: Once | INTRAVENOUS | Status: AC
  Administered 2024-03-20: 100 mg via INTRAVENOUS
  Filled 2024-03-20: qty 30

## 2024-03-20 NOTE — Patient Instructions (Signed)
 [facility] - A DEPT OF [facility]  Discharge Instructions: Thank you for choosing [facility] to provide your oncology and hematology care.  If you have a lab appointment with the Cancer Center, please go directly to the Cancer Center and check in at the registration area.  Wear comfortable clothing and clothing appropriate for easy access to any Portacath or PICC line.   We strive to give you quality time with your provider. You may need to reschedule your appointment if you arrive late (15 or more minutes).  Arriving late affects you and other patients whose appointments are after yours.  Also, if you miss three or more appointments without notifying the office, you may be dismissed from the clinic at the provider's discretion.      For prescription refill requests, have your pharmacy contact our office and allow 72 hours for refills to be completed.    Today you received the following chemotherapy and/or immunotherapy agents kyprolis      To help prevent nausea and vomiting after your treatment, we encourage you to take your nausea medication as directed.  BELOW ARE SYMPTOMS THAT SHOULD BE REPORTED IMMEDIATELY: *FEVER GREATER THAN 100.4 F (38 C) OR HIGHER *CHILLS OR SWEATING *NAUSEA AND VOMITING THAT IS NOT CONTROLLED WITH YOUR NAUSEA MEDICATION *UNUSUAL SHORTNESS OF BREATH *UNUSUAL BRUISING OR BLEEDING *URINARY PROBLEMS (pain or burning when urinating, or frequent urination) *BOWEL PROBLEMS (unusual diarrhea, constipation, pain near the anus) TENDERNESS IN MOUTH AND THROAT WITH OR WITHOUT PRESENCE OF ULCERS (sore throat, sores in mouth, or a toothache) UNUSUAL RASH, SWELLING OR PAIN  UNUSUAL VAGINAL DISCHARGE OR ITCHING   Items with * indicate a potential emergency and should be followed up as soon as possible or go to the Emergency Department if any problems should occur.  Please show the CHEMOTHERAPY ALERT CARD or IMMUNOTHERAPY  ALERT CARD at check-in to the Emergency Department and triage nurse.  Should you have questions after your visit or need to cancel or reschedule your appointment, please contact [facility] - A DEPT OF [facility]  [phone] and follow the prompts.  Office hours are 8:00 a.m. to 4:30 p.m. Monday - Friday. Please note that voicemails left after 4:00 p.m. may not be returned until the following business day.  We are closed weekends and major holidays. You have access to a nurse at all times for urgent questions. Please call the main number to the clinic [phone] and follow the prompts.  For any non-urgent questions, you may also contact your provider using MyChart. We now offer e-Visits for anyone 63 and older to request care online for non-urgent symptoms. For details visit [email or web address].   Also download the MyChart app! Go to the app store, search "MyChart", open the app, select [place], and log in with your MyChart username and password.

## 2024-03-23 ENCOUNTER — Encounter: Payer: Self-pay | Admitting: Internal Medicine

## 2024-03-27 ENCOUNTER — Inpatient Hospital Stay

## 2024-03-27 ENCOUNTER — Other Ambulatory Visit: Payer: Self-pay

## 2024-03-27 ENCOUNTER — Inpatient Hospital Stay: Attending: Internal Medicine

## 2024-03-27 VITALS — BP 114/75 | HR 85 | Temp 97.8°F | Resp 17 | Wt 152.7 lb

## 2024-03-27 DIAGNOSIS — C9002 Multiple myeloma in relapse: Secondary | ICD-10-CM | POA: Diagnosis not present

## 2024-03-27 DIAGNOSIS — E890 Postprocedural hypothyroidism: Secondary | ICD-10-CM | POA: Insufficient documentation

## 2024-03-27 DIAGNOSIS — C9000 Multiple myeloma not having achieved remission: Secondary | ICD-10-CM

## 2024-03-27 DIAGNOSIS — Z79620 Long term (current) use of immunosuppressive biologic: Secondary | ICD-10-CM | POA: Diagnosis not present

## 2024-03-27 DIAGNOSIS — Z5112 Encounter for antineoplastic immunotherapy: Secondary | ICD-10-CM | POA: Diagnosis present

## 2024-03-27 LAB — CMP (CANCER CENTER ONLY)
ALT: 12 U/L (ref 0–44)
AST: 16 U/L (ref 15–41)
Albumin: 4.2 g/dL (ref 3.5–5.0)
Alkaline Phosphatase: 62 U/L (ref 38–126)
Anion gap: 8 (ref 5–15)
BUN: 17 mg/dL (ref 8–23)
CO2: 20 mmol/L — ABNORMAL LOW (ref 22–32)
Calcium: 8.4 mg/dL — ABNORMAL LOW (ref 8.9–10.3)
Chloride: 108 mmol/L (ref 98–111)
Creatinine: 0.67 mg/dL (ref 0.44–1.00)
GFR, Estimated: >60 mL/min (ref >60)
Glucose, Bld: 98 mg/dL (ref 70–99)
Potassium: 3.2 mmol/L — ABNORMAL LOW (ref 3.5–5.1)
Sodium: 136 mmol/L (ref 135–145)
Total Bilirubin: 0.8 mg/dL (ref 0.0–1.2)
Total Protein: 7.2 g/dL (ref 6.5–8.1)

## 2024-03-27 LAB — CBC WITH DIFFERENTIAL (CANCER CENTER ONLY)
Abs Immature Granulocytes: 0.01 K/uL (ref 0.00–0.07)
Basophils Absolute: 0 K/uL (ref 0.0–0.1)
Basophils Relative: 1 %
Eosinophils Absolute: 0.1 K/uL (ref 0.0–0.5)
Eosinophils Relative: 2 %
HCT: 35.5 % — ABNORMAL LOW (ref 36.0–46.0)
Hemoglobin: 11.8 g/dL — ABNORMAL LOW (ref 12.0–15.0)
Immature Granulocytes: 0 %
Lymphocytes Relative: 41 %
Lymphs Abs: 2.1 K/uL (ref 0.7–4.0)
MCH: 29.6 pg (ref 26.0–34.0)
MCHC: 33.2 g/dL (ref 30.0–36.0)
MCV: 89 fL (ref 80.0–100.0)
Monocytes Absolute: 0.6 K/uL (ref 0.1–1.0)
Monocytes Relative: 11 %
Neutro Abs: 2.3 K/uL (ref 1.7–7.7)
Neutrophils Relative %: 45 %
Platelet Count: 96 K/uL — ABNORMAL LOW (ref 150–400)
RBC: 3.99 MIL/uL (ref 3.87–5.11)
RDW: 14.2 % (ref 11.5–15.5)
WBC Count: 5.1 K/uL (ref 4.0–10.5)
nRBC: 0 % (ref 0.0–0.2)

## 2024-03-27 MED ORDER — SODIUM CHLORIDE 0.9% FLUSH
10.0000 mL | INTRAVENOUS | Status: DC | PRN
  Administered 2024-03-27: 10 mL
  Filled 2024-03-27: qty 10

## 2024-03-27 MED ORDER — SODIUM CHLORIDE 0.9 % IV SOLN
Freq: Once | INTRAVENOUS | Status: AC
Start: 2024-03-27 — End: 2024-03-27
  Filled 2024-03-27: qty 250

## 2024-03-27 MED ORDER — MONTELUKAST SODIUM 10 MG PO TABS: 10.0000 mg | ORAL_TABLET | Freq: Every day | ORAL | 3 refills | Status: AC

## 2024-03-27 MED ORDER — PANTOPRAZOLE SODIUM 40 MG PO TBEC: 40.0000 mg | DELAYED_RELEASE_TABLET | Freq: Every day | ORAL | 0 refills | Status: AC

## 2024-03-27 MED ORDER — SODIUM CHLORIDE 0.9 % IV SOLN
Freq: Once | INTRAVENOUS | Status: AC
  Filled 2024-03-27: qty 250

## 2024-03-27 MED ORDER — DEXAMETHASONE SODIUM PHOSPHATE 10 MG/ML IJ SOLN
5.0000 mg | Freq: Once | INTRAMUSCULAR | Status: AC
  Administered 2024-03-27: 5 mg via INTRAVENOUS
  Filled 2024-03-27: qty 1

## 2024-03-27 MED ORDER — DEXTROSE 5 % IV SOLN
56.0000 mg/m2 | Freq: Once | INTRAVENOUS | Status: AC
  Administered 2024-03-27: 100 mg via INTRAVENOUS
  Filled 2024-03-27: qty 30

## 2024-03-27 NOTE — Patient Instructions (Signed)
 [facility] - A DEPT OF [facility]  Discharge Instructions: Thank you for choosing [facility] to provide your oncology and hematology care.  If you have a lab appointment with the Cancer Center, please go directly to the Cancer Center and check in at the registration area.  Wear comfortable clothing and clothing appropriate for easy access to any Portacath or PICC line.   We strive to give you quality time with your provider. You may need to reschedule your appointment if you arrive late (15 or more minutes).  Arriving late affects you and other patients whose appointments are after yours.  Also, if you miss three or more appointments without notifying the office, you may be dismissed from the clinic at the provider's discretion.      For prescription refill requests, have your pharmacy contact our office and allow 72 hours for refills to be completed.    Today you received the following chemotherapy and/or immunotherapy agents: carfilzomib    To help prevent nausea and vomiting after your treatment, we encourage you to take your nausea medication as directed.  BELOW ARE SYMPTOMS THAT SHOULD BE REPORTED IMMEDIATELY: *FEVER GREATER THAN 100.4 F (38 C) OR HIGHER *CHILLS OR SWEATING *NAUSEA AND VOMITING THAT IS NOT CONTROLLED WITH YOUR NAUSEA MEDICATION *UNUSUAL SHORTNESS OF BREATH *UNUSUAL BRUISING OR BLEEDING *URINARY PROBLEMS (pain or burning when urinating, or frequent urination) *BOWEL PROBLEMS (unusual diarrhea, constipation, pain near the anus) TENDERNESS IN MOUTH AND THROAT WITH OR WITHOUT PRESENCE OF ULCERS (sore throat, sores in mouth, or a toothache) UNUSUAL RASH, SWELLING OR PAIN  UNUSUAL VAGINAL DISCHARGE OR ITCHING   Items with * indicate a potential emergency and should be followed up as soon as possible or go to the Emergency Department if any problems should occur.  Please show the CHEMOTHERAPY ALERT CARD or IMMUNOTHERAPY  ALERT CARD at check-in to the Emergency Department and triage nurse.  Should you have questions after your visit or need to cancel or reschedule your appointment, please contact [facility] - A DEPT OF [facility]  [phone] and follow the prompts.  Office hours are 8:00 a.m. to 4:30 p.m. Monday - Friday. Please note that voicemails left after 4:00 p.m. may not be returned until the following business day.  We are closed weekends and major holidays. You have access to a nurse at all times for urgent questions. Please call the main number to the clinic [phone] and follow the prompts.  For any non-urgent questions, you may also contact your provider using MyChart. We now offer e-Visits for anyone 87 and older to request care online for non-urgent symptoms. For details visit [email or web address].   Also download the MyChart app! Go to the app store, search "MyChart", open the app, select , and log in with your MyChart username and password.

## 2024-03-27 NOTE — Progress Notes (Signed)
 Patient aware of low K today she stopped PO she will continue taking PO K at home

## 2024-03-28 ENCOUNTER — Inpatient Hospital Stay

## 2024-03-28 VITALS — BP 131/71 | HR 64 | Temp 95.1°F | Resp 18

## 2024-03-28 DIAGNOSIS — C9000 Multiple myeloma not having achieved remission: Secondary | ICD-10-CM

## 2024-03-28 DIAGNOSIS — Z5112 Encounter for antineoplastic immunotherapy: Secondary | ICD-10-CM | POA: Diagnosis not present

## 2024-03-28 LAB — KAPPA/LAMBDA LIGHT CHAINS
Kappa free light chain: 1.4 mg/L — ABNORMAL LOW (ref 3.3–19.4)
Kappa, lambda light chain ratio: 0.03 — ABNORMAL LOW (ref 0.26–1.65)
Lambda free light chains: 48 mg/L — ABNORMAL HIGH (ref 5.7–26.3)

## 2024-03-28 MED ORDER — SODIUM CHLORIDE 0.9 % IV SOLN
Freq: Once | INTRAVENOUS | Status: AC
  Filled 2024-03-28: qty 250

## 2024-03-28 MED ORDER — DEXTROSE 5 % IV SOLN
56.0000 mg/m2 | Freq: Once | INTRAVENOUS | Status: AC
  Administered 2024-03-28: 100 mg via INTRAVENOUS
  Filled 2024-03-28: qty 30

## 2024-03-28 MED ORDER — DEXAMETHASONE SODIUM PHOSPHATE 10 MG/ML IJ SOLN
5.0000 mg | Freq: Once | INTRAMUSCULAR | Status: AC
  Administered 2024-03-28: 5 mg via INTRAVENOUS
  Filled 2024-03-28: qty 1

## 2024-03-30 LAB — MULTIPLE MYELOMA PANEL, SERUM
Albumin SerPl Elph-Mcnc: 3.9 g/dL (ref 2.9–4.4)
Albumin/Glob SerPl: 1.4 (ref 0.7–1.7)
Alpha 1: 0.1 g/dL (ref 0.0–0.4)
Alpha2 Glob SerPl Elph-Mcnc: 0.7 g/dL (ref 0.4–1.0)
B-Globulin SerPl Elph-Mcnc: 1.2 g/dL (ref 0.7–1.3)
Gamma Glob SerPl Elph-Mcnc: 0.8 g/dL (ref 0.4–1.8)
Globulin, Total: 2.8 g/dL (ref 2.2–3.9)
IgA: 225 mg/dL (ref 64–422)
IgG (Immunoglobin G), Serum: 737 mg/dL (ref 586–1602)
IgM (Immunoglobulin M), Srm: <5 mg/dL — ABNORMAL LOW (ref 26–217)
Total Protein ELP: 6.7 g/dL (ref 6.0–8.5)

## 2024-04-02 ENCOUNTER — Encounter: Payer: Self-pay | Admitting: Internal Medicine

## 2024-04-02 ENCOUNTER — Inpatient Hospital Stay

## 2024-04-02 ENCOUNTER — Inpatient Hospital Stay (HOSPITAL_BASED_OUTPATIENT_CLINIC_OR_DEPARTMENT_OTHER): Admitting: Internal Medicine

## 2024-04-02 VITALS — BP 129/74 | HR 72

## 2024-04-02 VITALS — BP 126/85 | HR 89 | Temp 97.8°F | Resp 20 | Ht 62.0 in | Wt 151.4 lb

## 2024-04-02 DIAGNOSIS — Z5112 Encounter for antineoplastic immunotherapy: Secondary | ICD-10-CM | POA: Diagnosis not present

## 2024-04-02 DIAGNOSIS — C9000 Multiple myeloma not having achieved remission: Secondary | ICD-10-CM | POA: Diagnosis not present

## 2024-04-02 LAB — CBC WITH DIFFERENTIAL (CANCER CENTER ONLY)
Abs Immature Granulocytes: 0.02 K/uL (ref 0.00–0.07)
Basophils Absolute: 0 K/uL (ref 0.0–0.1)
Basophils Relative: 0 %
Eosinophils Absolute: 0.1 K/uL (ref 0.0–0.5)
Eosinophils Relative: 3 %
HCT: 36.5 % (ref 36.0–46.0)
Hemoglobin: 12.1 g/dL (ref 12.0–15.0)
Immature Granulocytes: 1 %
Lymphocytes Relative: 46 %
Lymphs Abs: 1.6 K/uL (ref 0.7–4.0)
MCH: 29.4 pg (ref 26.0–34.0)
MCHC: 33.2 g/dL (ref 30.0–36.0)
MCV: 88.8 fL (ref 80.0–100.0)
Monocytes Absolute: 0.4 K/uL (ref 0.1–1.0)
Monocytes Relative: 13 %
Neutro Abs: 1.3 K/uL — ABNORMAL LOW (ref 1.7–7.7)
Neutrophils Relative %: 37 %
Platelet Count: 92 K/uL — ABNORMAL LOW (ref 150–400)
RBC: 4.11 MIL/uL (ref 3.87–5.11)
RDW: 13.9 % (ref 11.5–15.5)
WBC Count: 3.5 K/uL — ABNORMAL LOW (ref 4.0–10.5)
nRBC: 1.5 % — ABNORMAL HIGH (ref 0.0–0.2)

## 2024-04-02 LAB — CMP (CANCER CENTER ONLY)
ALT: 29 U/L (ref 0–44)
AST: 36 U/L (ref 15–41)
Albumin: 4.1 g/dL (ref 3.5–5.0)
Alkaline Phosphatase: 62 U/L (ref 38–126)
Anion gap: 8 (ref 5–15)
BUN: 15 mg/dL (ref 8–23)
CO2: 21 mmol/L — ABNORMAL LOW (ref 22–32)
Calcium: 8.4 mg/dL — ABNORMAL LOW (ref 8.9–10.3)
Chloride: 109 mmol/L (ref 98–111)
Creatinine: 0.71 mg/dL (ref 0.44–1.00)
GFR, Estimated: >60 mL/min (ref >60)
Glucose, Bld: 99 mg/dL (ref 70–99)
Potassium: 3.7 mmol/L (ref 3.5–5.1)
Sodium: 138 mmol/L (ref 135–145)
Total Bilirubin: 0.7 mg/dL (ref 0.0–1.2)
Total Protein: 7.1 g/dL (ref 6.5–8.1)

## 2024-04-02 MED ORDER — ACETAMINOPHEN 325 MG PO TABS
650.0000 mg | ORAL_TABLET | Freq: Once | ORAL | Status: AC
  Administered 2024-04-02: 650 mg via ORAL
  Filled 2024-04-02: qty 2

## 2024-04-02 MED ORDER — DEXTROSE 5 % IV SOLN
56.0000 mg/m2 | Freq: Once | INTRAVENOUS | Status: AC
  Administered 2024-04-02: 100 mg via INTRAVENOUS
  Filled 2024-04-02: qty 30

## 2024-04-02 MED ORDER — DEXAMETHASONE SODIUM PHOSPHATE 10 MG/ML IJ SOLN
5.0000 mg | Freq: Once | INTRAMUSCULAR | Status: AC
  Administered 2024-04-02: 5 mg via INTRAVENOUS
  Filled 2024-04-02: qty 1

## 2024-04-02 MED ORDER — SODIUM CHLORIDE 0.9 % IV SOLN
Freq: Once | INTRAVENOUS | Status: AC
  Filled 2024-04-02: qty 250

## 2024-04-02 MED ORDER — DIPHENHYDRAMINE HCL 50 MG/ML IJ SOLN
50.0000 mg | Freq: Once | INTRAMUSCULAR | Status: AC
  Administered 2024-04-02: 50 mg via INTRAVENOUS
  Filled 2024-04-02: qty 1

## 2024-04-02 MED ORDER — SODIUM CHLORIDE 0.9 % IV SOLN
10.0000 mg/kg | Freq: Once | INTRAVENOUS | Status: AC
  Administered 2024-04-02: 700 mg via INTRAVENOUS
  Filled 2024-04-02: qty 25

## 2024-04-02 MED ORDER — FAMOTIDINE IN NACL 20-0.9 MG/50ML-% IV SOLN
20.0000 mg | Freq: Once | INTRAVENOUS | Status: AC
  Administered 2024-04-02: 20 mg via INTRAVENOUS
  Filled 2024-04-02: qty 50

## 2024-04-02 NOTE — Patient Instructions (Signed)
 [facility] - A DEPT OF [facility]  Discharge Instructions: Thank you for choosing [facility] to provide your oncology and hematology care.  If you have a lab appointment with the Cancer Center, please go directly to the Cancer Center and check in at the registration area.  Wear comfortable clothing and clothing appropriate for easy access to any Portacath or PICC line.   We strive to give you quality time with your provider. You may need to reschedule your appointment if you arrive late (15 or more minutes).  Arriving late affects you and other patients whose appointments are after yours.  Also, if you miss three or more appointments without notifying the office, you may be dismissed from the clinic at the provider's discretion.      For prescription refill requests, have your pharmacy contact our office and allow 72 hours for refills to be completed.    Today you received the following chemotherapy and/or immunotherapy agents- sarclisa , kyprolis       To help prevent nausea and vomiting after your treatment, we encourage you to take your nausea medication as directed.  BELOW ARE SYMPTOMS THAT SHOULD BE REPORTED IMMEDIATELY: *FEVER GREATER THAN 100.4 F (38 C) OR HIGHER *CHILLS OR SWEATING *NAUSEA AND VOMITING THAT IS NOT CONTROLLED WITH YOUR NAUSEA MEDICATION *UNUSUAL SHORTNESS OF BREATH *UNUSUAL BRUISING OR BLEEDING *URINARY PROBLEMS (pain or burning when urinating, or frequent urination) *BOWEL PROBLEMS (unusual diarrhea, constipation, pain near the anus) TENDERNESS IN MOUTH AND THROAT WITH OR WITHOUT PRESENCE OF ULCERS (sore throat, sores in mouth, or a toothache) UNUSUAL RASH, SWELLING OR PAIN  UNUSUAL VAGINAL DISCHARGE OR ITCHING   Items with * indicate a potential emergency and should be followed up as soon as possible or go to the Emergency Department if any problems should occur.  Please show the CHEMOTHERAPY ALERT CARD or  IMMUNOTHERAPY ALERT CARD at check-in to the Emergency Department and triage nurse.  Should you have questions after your visit or need to cancel or reschedule your appointment, please contact [facility] - A DEPT OF [facility]  [phone] and follow the prompts.  Office hours are 8:00 a.m. to 4:30 p.m. Monday - Friday. Please note that voicemails left after 4:00 p.m. may not be returned until the following business day.  We are closed weekends and major holidays. You have access to a nurse at all times for urgent questions. Please call the main number to the clinic [phone] and follow the prompts.  For any non-urgent questions, you may also contact your provider using MyChart. We now offer e-Visits for anyone 54 and older to request care online for non-urgent symptoms. For details visit [email or web address].   Also download the MyChart app! Go to the app store, search MyChart, open the app, select [facility], and log in with your MyChart username and password.

## 2024-04-02 NOTE — Progress Notes (Signed)
 No concerns today

## 2024-04-02 NOTE — Assessment & Plan Note (Addendum)
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   MAY 2024- 1.3. ]  currently on Isa-Dex; d-2 carfil weekly q 28 days. # MARCH 2025- M-protein -0.3 ; kappa lambda light chain ratio-1.8 lambda light chain 63-slowly RISING FEB 2025-chemotherapy was interrupted-until MAY 2025- because of thigh infection needing wound VAC.    # S/p  re-evaluation at [facility] in June 2025- Labs c/w early progression. No evidence of end organ damage, will repeat PET/CT- bone marrow in next 1-2 months based on the response to MM protein. Monitor for now closely- PET -pending.   # For now proceed with with  Isa-Dex-Carfil- Labs-CBC/chemistries were reviewed with the patient.     # DROP in EF:  MARCH 2024-Echo [[facility]] Left ventricular EF 50 to 55%; AUG 13th/OCT 23rd, 2024- EF-45-50%.  FEB 26th, 2025- Left ventricular ejection fraction, by estimation, is 50 to 55%. ECHO-pending 9/29; PET 04/16/2024.   # Anemia- JAn 2025- I ron sat- 8%; ferritin- 19 on prn venofer - Stable.   # Electrolyte: Hypokalemia: mild-  continue compliance Kdur TID-; hypocalcemia- 8.0-    # Iatrogenic hypothyroidism [ Graves/goiter s/p total thyroidectomy;aug,2022 ]-currently on Synthroid  175 mcg/day-- elevated TSH- recommend spacing other medications.   # MAY 2025- thigh infection s/p wound VAC [Dr.Rodenberg]  proceed witjh IVIG next week. .   # Glaucoma BIL- L > R [Dr.King; [facility]] - ? Steroid induced on eye drops. decreased the steroids. Discussed with Dr.Haines, ophthalmology- Glaucoma under control. .   # chronic  Lower back discomfort - Continue tramadol - refilled . stable  # Bone lesions /last zometa  on 11/27/2017.  S/p   Dental extraction PET scan evidence of any bone lesions.- consider Zometa   at next visit.   # Infection prophylaxis: Acyclovir ; asprin- add IVIG infusion [IgG < 400]- plan  IVIG- q 4 w #1 on- 9/17- Stable.   # IV access: mediport-  functional  # ACP: Patient has  incurable cancer.  However patient is currently doing well. Patient interested in continue current scope of therapy; and full code.  6 w- given dental extrac- on 1/26. :MM panel; K/l light chain ratio q 4 W  ; NO zometa - dental issues-ok; but hypocalemia  Starting aug 25th- q2w-MD: D-isa-dex;car1&2-car; weekly x 3; week 4-OFF;  q 28 day cycle PS:  # DISPOSITION: # order standing orders for 4 weeks- MM panel/k-l light chains- # weekly port-cbc/cmp # follow up as per IS-   # in 5 weeks- IVIG  # as planned ECHO/PET scan-Dr.B

## 2024-04-02 NOTE — Progress Notes (Signed)
 [facility] OFFICE PROGRESS NOTE  Patient Care Team: Buren Rock HERO, MD as PCP - General (Family Medicine) End, Lonni, MD as PCP - Cardiology (Cardiology) Guinevere File, [name] Rennie Cindy SAUNDERS, MD as Consulting Physician (Oncology) Parris Manna, MD as Consulting Physician (Pulmonary Disease) Damian Therisa HERO, MD as Consulting Physician (Endocrinology)   Cancer Staging  Multiple myeloma not having achieved remission [facility]) Staging form: Plasma Cell Myeloma and Plasma Cell Disorders, AJCC 8th Edition - Clinical: No stage assigned - Unsigned - Clinical: No stage assigned - Unsigned   Oncology History Overview Note  # SEP 2018- MULTIPLE MYELOMA IgALamda [2.5 gm/dl; K/L= 05/8700]; STAGE III [beta 2 microglobulin=5.5] [presented with acute renal failure; anemia; NO hypercalcemia; Skeletal survey-Normal]; BMBx- 45% plasma cells; FISH-POSITIVE 11:14 translocation.[STANDARD-high RISK]/cyto-Normal; SEP 2018- PET- L3 posterior element lesion.   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 No morphologic support for residual myeloma disease. Negative for minimal residual disease by MM-MRD flow cytometry; HOLD REVLIMID  [leg swelling]; AUG 2021-PET scan negative for myeloma; continue to hold Revlimid   # MARCH 2021-diastolic congestive heart failure [Dr.End]  # BMBx- OCT 2021- BMBx- 5% plasmacytosis-however this appears to be more polyclonal  rather than monoclonal-not explain patient worsening anemia.   # OCT 2022- 18th- Dara- Rev-Dex; MARCH, 2024- PROGRESSION bone marrow biopsy 70% plasma cells; PET scan no bone lesions-however conus medullaris uptake-MRI lumbar spine-   # MAY 2024- DISCONTINUE carfilzomib -dexamethasone - Pomalyst ; [rising M-protein; MAY 2024- 1.3. ]   # start 12/27/2022 -Isa-Carfil-Dex;  [decrease dexamethasone  20 weekly- starting 05/04/23- sec to glaucoma].   # OCT 24th, 2024- Bone marrow Biopsy: The findings are consistent with  focal minimal residual lambda restricted plasma cell neoplasm- HOLD treatment sec to glaucoma; and drop in EF AUG 2024- 45-50% [March 2024- 50-55%]  # MARCH 16th, 2025- re-started Isa-Carfil-Dex;  #November 2021-hyperthyroidism/goiter- [D.Bennett/Dr.Solum]-methimazole . S/p Thyroidectomy [Dr.Kim; [facility]-AUG 2022]  --------------------------  # 12/12- RIGHT JUGULAR DVT-x 43m on xarelto ; finished April 2020; September 2020-EGD/dysphagia; Dr. Therisa  # Acute renal failure [Dr.Singh; Proteinuria 1.5gm/day ]; acyclovir Mickeal ------------------------------------------------------------------------------------------------------------   DIAGNOSIS: [ ]  MULTIPLE MYELOMA  STAGE: III/HIGH RISK ;GOALS: CONTROL      Multiple myeloma not having achieved remission (HCC)  11/21/2017 - 04/28/2018 Chemotherapy   Patient is on Treatment Plan : MYELOMA SALVAGE Cyclophosphamide  / Carfilzomib  / Dexamethasone  (CCd) q28d     05/12/2021 - 02/25/2022 Chemotherapy   Patient is on Treatment Plan : MYELOMA RELAPSED REFRACTORY Daratumumab  SQ + Lenalidomide  + Dexamethasone  (DaraRd) q28d     05/12/2021 - 07/23/2022 Chemotherapy   Patient is on Treatment Plan : MYELOMA RELAPSED REFRACTORY Daratumumab  SQ + Lenalidomide  + Dexamethasone  (DaraRd) q28d     11/02/2022 - 11/16/2022 Chemotherapy   Patient is on Treatment Plan : MYELOMA RELAPSED/ REFRACTORY Carfilzomib  D1,8,15 (20/27) + Pomalidomide  + Dexamethasone  (KPd) q28d      12/27/2022 -  Chemotherapy   Patient is on Treatment Plan : MYELOMA Carfilzomib  + Dexamethasone  + Isatuximab  q28d      INTERVAL HISTORY: Ambulating  independently.  Alone.  Brandi  E [name] 73 y.o.  female pleasant patient above history of relapsed multiple myeloma--currently re-started back after 3 months break [JULY 28th, 2025]  Isa- Carfil-Dex  is here for a follow up.   No further signs of of infection.  No fevers or chills.. Denies any worsening pain. Patient taking tramadol  as needed for upper and leg cramps. Mild tingling in feet.  No difficulty breathing. No worsening leg swelling.    Review of Systems  Constitutional:  Positive for malaise/fatigue. Negative for chills, diaphoresis and fever.  HENT:  Negative for nosebleeds and sore throat.   Eyes:  Negative for double vision.  Respiratory:  Negative for hemoptysis.   Cardiovascular:  Negative for chest pain, palpitations and orthopnea.  Gastrointestinal:  Negative for abdominal pain, blood in stool, constipation, heartburn and melena.  Genitourinary:  Negative for dysuria, frequency and urgency.  Musculoskeletal:  Positive for back pain.  Skin: Negative.  Negative for itching and rash.  Neurological:  Negative for dizziness, tingling, focal weakness, weakness and headaches.  Endo/Heme/Allergies:  Does not bruise/bleed easily.  Psychiatric/Behavioral:  Negative for depression. The patient is not nervous/anxious.     PAST MEDICAL HISTORY :  Past Medical History:  Diagnosis Date   (HFpEF) heart failure with preserved ejection fraction (HCC)    a. 06/2020 Echo: EF 60-65%, no rwma, nl RV size/fxn. Triv MR. Triv TR/AI. Mod elev PASP.   Anxiety    COPD (chronic obstructive pulmonary disease) (HCC)    GERD (gastroesophageal reflux disease)    Hypertension    Hyperthyroidism    Hypokalemia    IgA myeloma (HCC)    MRSA (methicillin resistant Staphylococcus aureus) infection 12/06/2023   Multiple myeloma (HCC)    Necrotizing  cellulitis 11/30/2023   Open wound of left thigh 12/15/2023   Pneumonia    Red blood cell antibody positive, compatible PRBC difficult to obtain    S/P autologous bone marrow transplantation (HCC)    Sepsis (HCC) 11/25/2023    PAST SURGICAL HISTORY :   Past Surgical History:  Procedure Laterality Date   ANTERIOR VITRECTOMY Left 10/07/2021   Procedure: ANTERIOR VITRECTOMY;  Surgeon: Mittie Gaskin, MD;  Location: [facility];  Service: Ophthalmology;  Laterality: Left;   CATARACT EXTRACTION W/PHACO Left 10/07/2021   Procedure: CATARACT EXTRACTION PHACO AND INTRAOCULAR LENS PLACEMENT (IOC) LEFT VISION BLUE;  Surgeon: Mittie Gaskin, MD;  Location: [facility];  Service: Ophthalmology;  Laterality: Left;  kit for manual incision 14.56 01:21.4   CATARACT EXTRACTION W/PHACO Right 10/21/2021   Procedure: CATARACT EXTRACTION PHACO AND INTRAOCULAR LENS PLACEMENT (IOC) RIGHT 11.12 01:48.1;  Surgeon: Mittie Gaskin, MD;  Location: [facility];  Service: Ophthalmology;  Laterality: Right;   ESOPHAGOGASTRODUODENOSCOPY (EGD) WITH PROPOFOL  N/A 03/30/2019   Procedure: ESOPHAGOGASTRODUODENOSCOPY (EGD) WITH PROPOFOL ;  Surgeon: Therisa Bi, MD;  Location: [facility];  Service: Gastroenterology;  Laterality: N/A;   INCISION AND DRAINAGE ABSCESS Left 12/01/2023   Procedure: INCISION AND DRAINAGE, ABSCESS;  Surgeon: Lane Shope, MD;  Location: [facility];  Service: General;  Laterality: Left;   IR BONE MARROW BIOPSY & ASPIRATION  05/25/2023   IR FLUORO GUIDE PORT INSERTION RIGHT  12/16/2017   IR IMAGING GUIDED PORT INSERTION  06/15/2021   KNEE ARTHROSCOPY Left 09/03/2022   Procedure: ARTHROSCOPY KNEE DEBRIDEMENT;  Surgeon: Leora Lynwood SAUNDERS, MD;  Location: [facility];  Service: Orthopedics;  Laterality: Left;   THYROIDECTOMY N/A     FAMILY HISTORY :   Family History  Problem Relation Age of Onset  Pneumonia Mother    Seizures Father     SOCIAL HISTORY:    Social History   Tobacco Use   Smoking status: Former    Current packs/day: 0.00    Types: Cigarettes    Quit date: 03/18/2021    Years since quitting: 3.0   Smokeless tobacco: Never   Tobacco comments:    2 cigarettes QOD  Vaping Use   Vaping status: Never Used  Substance Use Topics   Alcohol use: No   Drug use: No    ALLERGIES:  has no known allergies.  MEDICATIONS:  Current Outpatient Medications  Medication Sig Dispense Refill   acyclovir  (ZOVIRAX ) 400 MG tablet Take 1 tablet (400 mg total) by mouth 2 (two) times daily. 120 tablet 2   albuterol  (VENTOLIN  HFA) 108 (90 Base) MCG/ACT inhaler Inhale 2 puffs into the lungs every 6 (six) hours as needed for wheezing or shortness of breath. 8 g 3   aspirin  EC 81 MG EC tablet Take 1 tablet (81 mg total) by mouth daily. 90 tablet 3   Brinzolamide -Brimonidine  (SIMBRINZA) 1-0.2 % SUSP Apply 1 drop to eye in the morning and at bedtime. I drop both eyes twice a day     Calcium  Carb-Cholecalciferol 600-10 MG-MCG TABS Take 1 tablet by mouth daily.     calcium  carbonate (CALCIUM  600 HIGH POTENCY) 600 MG TABS tablet Take 2 tablets (1,200 mg total) by mouth 2 (two) times daily. 120 tablet 2   furosemide  (LASIX ) 40 MG tablet Take 1 tablet (40 mg total) by mouth daily as needed for edema or fluid.     levothyroxine  (SYNTHROID ) 175 MCG tablet Take 1 tablet (175 mcg total) by mouth daily. For thyroid  90 tablet 1   lidocaine -prilocaine  (EMLA ) cream Apply 1 application topically as needed. Apply to port and cover with saran wrap 1-2 hours prior to port access 30 g 1   magnesium  oxide (MAG-OX) 400 (240 Mg) MG tablet Take 1 tablet (400 mg total) by mouth 2 (two) times daily. 60 tablet 3   montelukast  (SINGULAIR ) 10 MG tablet Take 1 tablet (10 mg total) by mouth at bedtime. 30 tablet 3   NIFEdipine (ADALAT CC) 30 MG 24 hr tablet Take 30 mg by mouth daily.     pantoprazole  (PROTONIX ) 40 MG tablet Take 1 tablet (40 mg total) by mouth daily. 90 tablet 0    polyethylene glycol (MIRALAX  / GLYCOLAX ) packet Take 17 g by mouth daily as needed for mild constipation. 14 each 0   spironolactone  (ALDACTONE ) 25 MG tablet Take 1 tablet (25 mg total) by mouth daily as needed (edema). 30 tablet 1   traMADol  (ULTRAM ) 50 MG tablet Take 1 tablet (50 mg total) by mouth every 12 (twelve) hours as needed. 60 tablet 0   Vitamin D , Ergocalciferol , (DRISDOL ) 1.25 MG (50000 UNIT) CAPS capsule Take 1 capsule by mouth once a week 12 capsule 0   No current facility-administered medications for this visit.   Facility-Administered Medications Ordered in Other Visits  Medication Dose Route Frequency Provider Last Rate Last Admin   0.9 %  sodium chloride  infusion   Intravenous Once Kirbi Farrugia R, MD       0.9 %  sodium chloride  infusion   Intravenous Once Abbygail Willhoite R, MD       acetaminophen  (TYLENOL ) tablet 650 mg  650 mg Oral Once Thadd Apuzzo R, MD       carfilzomib  (KYPROLIS ) 100 mg in dextrose  5 % 100 mL chemo infusion  56 mg/m2 (Treatment Plan Recorded) Intravenous Once Anaaya Fuster R, MD       dexamethasone  (DECADRON ) injection 5 mg  5 mg Intravenous Once Sarkis Rhines R, MD       diphenhydrAMINE  (BENADRYL ) injection 50 mg  50 mg Intravenous Once Monseratt Ledin R, MD       famotidine  (PEPCID ) IVPB 20 mg premix  20 mg Intravenous Once Regis Wiland R, MD       isatuximab -irfc (SARCLISA ) 700 mg in sodium chloride  0.9 % 215 mL (2.8 mg/mL) chemo infusion  10 mg/kg (Treatment Plan Recorded) Intravenous Once Eusebio Blazejewski R, MD        PHYSICAL EXAMINATION:   BP 126/85 (BP Location: Left Arm, Patient Position: Sitting, Cuff Size: Large)   Pulse 89   Temp 97.8 F (36.6 C) (Tympanic)   Resp 20   Ht 5' 2 (1.575 m)   Wt 151 lb 6.4 oz (68.7 kg)   SpO2 100%   BMI 27.69 kg/m   Filed Weights   04/02/24 0913  Weight: 151 lb 6.4 oz (68.7 kg)         Mild leg swelling.   Physical Exam HENT:     Head:  Normocephalic and atraumatic.  Eyes:     Pupils: Pupils are equal, round, and reactive to light.  Cardiovascular:     Rate and Rhythm: Normal rate and regular rhythm.  Pulmonary:     Effort: Pulmonary effort is normal. No respiratory distress.     Breath sounds: Normal breath sounds. No wheezing.  Abdominal:     General: Bowel sounds are normal. There is no distension.     Palpations: Abdomen is soft. There is no mass.     Tenderness: There is no abdominal tenderness. There is no guarding or rebound.  Musculoskeletal:        General: No tenderness. Normal range of motion.     Cervical back: Normal range of motion and neck supple.  Skin:    General: Skin is warm.  Neurological:     Mental Status: She is alert and oriented to person, place, and time.  Psychiatric:        Mood and Affect: Affect normal.    LABORATORY DATA:  I have reviewed the data as listed    Component Value Date/Time   NA 138 04/02/2024 0919   NA 141 01/25/2020 1530   K 3.7 04/02/2024 0919   CL 109 04/02/2024 0919   CO2 21 (L) 04/02/2024 0919   GLUCOSE 99 04/02/2024 0919   BUN 15 04/02/2024 0919   BUN 19 01/25/2020 1530   CREATININE 0.71 04/02/2024 0919   CALCIUM  8.4 (L) 04/02/2024 0919   PROT 7.1 04/02/2024 0919   ALBUMIN 4.1 04/02/2024 0919   AST 36 04/02/2024 0919   ALT 29 04/02/2024 0919   ALKPHOS 62 04/02/2024 0919   BILITOT 0.7 04/02/2024 0919   GFRNONAA >60 04/02/2024 0919   GFRAA NOT CALCULATED 04/17/2020 1352    No results found for: SPEP, UPEP  Lab Results  Component Value Date   WBC 3.5 (L) 04/02/2024   NEUTROABS 1.3 (L) 04/02/2024   HGB 12.1 04/02/2024   HCT 36.5 04/02/2024   MCV 88.8 04/02/2024   PLT 92 (L) 04/02/2024      Chemistry      Component Value Date/Time   NA 138 04/02/2024 0919   NA 141 01/25/2020 1530   K 3.7 04/02/2024 0919   CL 109 04/02/2024 0919   CO2 21 (L) 04/02/2024  0919   BUN 15 04/02/2024 0919   BUN 19 01/25/2020 1530   CREATININE 0.71  04/02/2024 0919      Component Value Date/Time   CALCIUM  8.4 (L) 04/02/2024 0919   ALKPHOS 62 04/02/2024 0919   AST 36 04/02/2024 0919   ALT 29 04/02/2024 0919   BILITOT 0.7 04/02/2024 0919      (H): Data is abnormally high   Latest Reference Range & Units Most Recent 12/24/20 14:01 04/01/21 14:34 04/28/21 09:17 06/11/21 09:42 07/03/21 10:32 08/20/21 10:06  M Protein SerPl Elph-Mcnc Not Observed g/dL 1.1 (H) (C) 8/73/76 89:93 Not Observed (C) 0.6 (H) (C) 0.7 (H) (C) 0.5 (H) (C) 0.7 (H) (C) 1.1 (H) (C)    Latest Reference Range & Units Most Recent 09/17/21 08:47 10/15/21 08:25 11/12/21 08:55  Kappa free light chain 3.3 - 19.4 mg/L 5.0 11/12/21 08:55 7.2 7.2 5.0  Lambda free light chains 5.7 - 26.3 mg/L 223.6 (H) 11/12/21 08:55 215.3 (H) 340.2 (H) 223.6 (H)  Kappa, lambda light chain ratio 0.26 - 1.65  0.02 (L) 11/12/21 08:55 0.03 (L) 0.02 (L) 0.02 (L)  (H): Data is abnormally high (L): Data is abnormally low  RADIOGRAPHIC STUDIES: I have personally reviewed the radiological images as listed and agreed with the findings in the report. No results found.   ASSESSMENT & PLAN:  Multiple myeloma not having achieved remission (HCC) # RECURRENT Multiple myeloma stage III [high-risk cytogenetics-status post KRD-VGPR ; status post autologous stem cell transplant on 06/15/18.   MAY 2024- 1.3. ]  currently on Isa-Dex; d-2 carfil weekly q 28 days. # MARCH 2025- M-protein -0.3 ; kappa lambda light chain ratio-1.8 lambda light chain 63-slowly RISING FEB 2025-chemotherapy was interrupted-until MAY 2025- because of thigh infection needing wound VAC.    # S/p  re-evaluation at [facility] in June 2025- Labs c/w early progression. No evidence of end organ damage, will repeat PET/CT- bone marrow in next 1-2 months based on the response to MM protein. Monitor for now closely- PET -pending.   # For now proceed with with  Isa-Dex-Carfil- Labs-CBC/chemistries were reviewed with the patient.     # DROP in EF:   MARCH 2024-Echo [[facility]] Left ventricular EF 50 to 55%; AUG 13th/OCT 23rd, 2024- EF-45-50%.  FEB 26th, 2025- Left ventricular ejection fraction, by estimation, is 50 to 55%. ECHO-pending 9/29; PET 04/16/2024.   # Anemia- JAn 2025- I ron sat- 8%; ferritin- 19 on prn venofer - Stable.   # Electrolyte: Hypokalemia: mild-  continue compliance Kdur TID-; hypocalcemia- 8.0-    # Iatrogenic hypothyroidism [ Graves/goiter s/p total thyroidectomy;aug,2022 ]-currently on Synthroid  175 mcg/day-- elevated TSH- recommend spacing other medications.   # MAY 2025- thigh infection s/p wound VAC [Dr.Rodenberg]  proceed witjh IVIG next week. .   # Glaucoma BIL- L > R [Dr.King; [facility]] - ? Steroid induced on eye drops. decreased the steroids. Discussed with Dr.Haines, ophthalmology- Glaucoma under control. .   # chronic  Lower back discomfort - Continue tramadol - refilled . stable  # Bone lesions /last zometa  on 11/27/2017.  S/p   Dental extraction PET scan evidence of any bone lesions.- consider Zometa   at next visit.   # Infection prophylaxis: Acyclovir ; asprin- add IVIG infusion [IgG < 400]- plan  IVIG- q 4 w #1 on- 9/17- Stable.   # IV access: mediport- functional  # ACP: Patient has  incurable cancer.  However patient is currently doing well. Patient interested in continue current scope of therapy; and full code.  6 w- given dental extrac- on 1/26. :MM panel; K/l light chain ratio q 4 W  ; NO zometa - dental issues-ok; but hypocalemia  Starting aug 25th- q2w-MD: D-isa-dex;car1&2-car; weekly x 3; week 4-OFF;  q 28 day cycle PS:  # DISPOSITION: # order standing orders for 4 weeks- MM panel/k-l light chains- # weekly port-cbc/cmp # follow up as per IS-   # in 5 weeks- IVIG  # as planned ECHO/PET scan-Dr.B     No orders of the defined types were placed in this encounter.  All questions were answered. The patient knows to call the clinic with any problems, questions or concerns.      Cindy JONELLE Joe, MD 04/02/2024 10:36 AM End

## 2024-04-03 ENCOUNTER — Inpatient Hospital Stay

## 2024-04-03 ENCOUNTER — Other Ambulatory Visit: Payer: Self-pay

## 2024-04-03 VITALS — BP 115/65 | HR 62 | Temp 97.4°F | Resp 18

## 2024-04-03 DIAGNOSIS — Z5112 Encounter for antineoplastic immunotherapy: Secondary | ICD-10-CM | POA: Diagnosis not present

## 2024-04-03 DIAGNOSIS — C9000 Multiple myeloma not having achieved remission: Secondary | ICD-10-CM

## 2024-04-03 MED ORDER — SODIUM CHLORIDE 0.9 % IV SOLN
Freq: Once | INTRAVENOUS | Status: AC
  Filled 2024-04-03: qty 250

## 2024-04-03 MED ORDER — DEXAMETHASONE SODIUM PHOSPHATE 10 MG/ML IJ SOLN
5.0000 mg | Freq: Once | INTRAMUSCULAR | Status: AC
  Administered 2024-04-03: 5 mg via INTRAVENOUS
  Filled 2024-04-03: qty 1

## 2024-04-03 MED ORDER — DEXTROSE 5 % IV SOLN
56.0000 mg/m2 | Freq: Once | INTRAVENOUS | Status: AC
  Administered 2024-04-03: 100 mg via INTRAVENOUS
  Filled 2024-04-03: qty 30

## 2024-04-03 NOTE — Patient Instructions (Signed)
 [facility] - A DEPT OF [facility]  Discharge Instructions: Thank you for choosing [facility] to provide your oncology and hematology care.  If you have a lab appointment with the Cancer Center, please go directly to the Cancer Center and check in at the registration area.  Wear comfortable clothing and clothing appropriate for easy access to any Portacath or PICC line.   We strive to give you quality time with your provider. You may need to reschedule your appointment if you arrive late (15 or more minutes).  Arriving late affects you and other patients whose appointments are after yours.  Also, if you miss three or more appointments without notifying the office, you may be dismissed from the clinic at the provider's discretion.      For prescription refill requests, have your pharmacy contact our office and allow 72 hours for refills to be completed.    Today you received the following chemotherapy and/or immunotherapy agents kyprolis      To help prevent nausea and vomiting after your treatment, we encourage you to take your nausea medication as directed.  BELOW ARE SYMPTOMS THAT SHOULD BE REPORTED IMMEDIATELY: *FEVER GREATER THAN 100.4 F (38 C) OR HIGHER *CHILLS OR SWEATING *NAUSEA AND VOMITING THAT IS NOT CONTROLLED WITH YOUR NAUSEA MEDICATION *UNUSUAL SHORTNESS OF BREATH *UNUSUAL BRUISING OR BLEEDING *URINARY PROBLEMS (pain or burning when urinating, or frequent urination) *BOWEL PROBLEMS (unusual diarrhea, constipation, pain near the anus) TENDERNESS IN MOUTH AND THROAT WITH OR WITHOUT PRESENCE OF ULCERS (sore throat, sores in mouth, or a toothache) UNUSUAL RASH, SWELLING OR PAIN  UNUSUAL VAGINAL DISCHARGE OR ITCHING   Items with * indicate a potential emergency and should be followed up as soon as possible or go to the Emergency Department if any problems should occur.  Please show the CHEMOTHERAPY ALERT CARD or IMMUNOTHERAPY  ALERT CARD at check-in to the Emergency Department and triage nurse.  Should you have questions after your visit or need to cancel or reschedule your appointment, please contact [facility] - A DEPT OF [facility]  [phone] and follow the prompts.  Office hours are 8:00 a.m. to 4:30 p.m. Monday - Friday. Please note that voicemails left after 4:00 p.m. may not be returned until the following business day.  We are closed weekends and major holidays. You have access to a nurse at all times for urgent questions. Please call the main number to the clinic [phone] and follow the prompts.  For any non-urgent questions, you may also contact your provider using MyChart. We now offer e-Visits for anyone 63 and older to request care online for non-urgent symptoms. For details visit [email or web address].   Also download the MyChart app! Go to the app store, search "MyChart", open the app, select [place], and log in with your MyChart username and password.

## 2024-04-05 ENCOUNTER — Telehealth: Payer: Self-pay | Admitting: *Deleted

## 2024-04-05 NOTE — Telephone Encounter (Signed)
 Ileana said that the patient has resorted to get care from [facility] and they have ran into interactions of certain drugs and she will be sending that over to us  so that we can add that in her notes.  Telephone [phone]

## 2024-04-09 ENCOUNTER — Inpatient Hospital Stay

## 2024-04-09 VITALS — BP 118/68 | HR 63 | Temp 97.2°F | Resp 18 | Ht 62.0 in | Wt 153.9 lb

## 2024-04-09 DIAGNOSIS — Z5112 Encounter for antineoplastic immunotherapy: Secondary | ICD-10-CM | POA: Diagnosis not present

## 2024-04-09 DIAGNOSIS — C9000 Multiple myeloma not having achieved remission: Secondary | ICD-10-CM

## 2024-04-09 MED ORDER — DIPHENHYDRAMINE HCL 25 MG PO CAPS
25.0000 mg | ORAL_CAPSULE | Freq: Once | ORAL | Status: AC
  Administered 2024-04-09: 25 mg via ORAL
  Filled 2024-04-09: qty 1

## 2024-04-09 MED ORDER — DEXTROSE 5 % IV SOLN
Freq: Once | INTRAVENOUS | Status: AC
  Filled 2024-04-09: qty 250

## 2024-04-09 MED ORDER — IMMUNE GLOBULIN (HUMAN) 10 GM/100ML IV SOLN
400.0000 mg/kg | Freq: Once | INTRAVENOUS | Status: AC
  Administered 2024-04-09: 25 g via INTRAVENOUS
  Filled 2024-04-09: qty 50

## 2024-04-09 MED ORDER — ACETAMINOPHEN 325 MG PO TABS
650.0000 mg | ORAL_TABLET | Freq: Once | ORAL | Status: AC
  Administered 2024-04-09: 650 mg via ORAL
  Filled 2024-04-09: qty 2

## 2024-04-09 NOTE — Patient Instructions (Signed)
 Immune Globulin  Injection What is this medication? IMMUNE GLOBULIN  (im MUNE GLOB yoo lin) treats many immune system conditions. It works by Designer, multimedia extra antibodies. Antibodies are proteins made by the immune system that help protect the body. This medicine may be used for other purposes; ask your health care provider or pharmacist if you have questions. COMMON BRAND NAME(S): ASCENIV, Baygam, BIVIGAM, Carimune, Carimune NF, cutaquig, Cuvitru, Flebogamma, Flebogamma DIF, GamaSTAN, GamaSTAN S/D, Gamimune N, Gammagard, Gammagard S/D, Gammaked, Gammaplex, Gammar-P IV, Gamunex, Gamunex-C, Hizentra, Iveegam, Iveegam EN, Octagam, Panglobulin, Panglobulin NF, panzyga, Polygam S/D, Privigen , Sandoglobulin, Venoglobulin-S, Vigam, Vivaglobulin, Xembify What should I tell my care team before I take this medication? They need to know if you have any of these conditions: Blood clotting disorder Condition where you have excess fluid in your body, such as heart failure or edema Dehydration Diabetes Have had blood clots Heart disease Immune system conditions Kidney disease Low levels of IgA Recent or upcoming vaccine An unusual or allergic reaction to immune globulin , other medications, foods, dyes, or preservatives Pregnant or trying to get pregnant Breastfeeding How should I use this medication? This medication is infused into a vein or under the skin. It may also be injected into a muscle. It is usually given by your care team in a hospital or clinic setting. It may also be given at home. If you get this medication at home, you will be taught how to prepare and give it. Take it as directed on the prescription label. Keep taking it unless your care team tells you to stop. It is important that you put your used needles and syringes in a special sharps container. Do not put them in a trash can. If you do not have a sharps container, call your pharmacist or care team to get one. Talk to your care team  about the use of this medication in children. While it may be given to children for selected conditions, precautions do apply. Overdosage: If you think you have taken too much of this medicine contact a poison control center or emergency room at once. NOTE: This medicine is only for you. Do not share this medicine with others. What if I miss a dose? If you get this medication at the hospital or clinic: It is important not to miss your dose. Call your care team if you are unable to keep an appointment. If you give yourself this medication at home: If you miss a dose, take it as soon as you can. Then continue your normal schedule. If it is almost time for your next dose, take only that dose. Do not take double or extra doses. Call your care team with questions. What may interact with this medication? Live virus vaccines This list may not describe all possible interactions. Give your health care provider a list of all the medicines, herbs, non-prescription drugs, or dietary supplements you use. Also tell them if you smoke, drink alcohol , or use illegal drugs. Some items may interact with your medicine. What should I watch for while using this medication? Your condition will be monitored carefully while you are receiving this medication. Tell your care team if your symptoms do not start to get better or if they get worse. You may need blood work done while you are taking this medication. This medication increases the risk of blood clots. People with heart, blood vessel, or blood clotting conditions are more likely to develop a blood clot. Other risk factors include advanced age, estrogen use, tobacco  use, lack of movement, and being overweight. This medication can decrease the response to a vaccine. If you need to get vaccinated, tell your care team if you have received this medication within the last year. Extra booster doses may be needed. Talk to your care team to see if a different vaccination schedule  is needed. This medication is made from donated human blood. There is a small risk it may contain bacteria or viruses, such as hepatitis or HIV. All products are processed to kill most bacteria and viruses. Talk to your care team if you have questions about the risk of infection. If you have diabetes, talk to your care team about which device you should use to check your blood sugar. This medication may cause some devices to report falsely high blood sugar levels. This may cause you to react by not treating a low blood sugar level or by giving an insulin  dose that was not needed. This can cause severe low blood sugar levels. What side effects may I notice from receiving this medication? Side effects that you should report to your care team as soon as possible: Allergic reactions--skin rash, itching, hives, swelling of the face, lips, tongue, or throat Blood clot--pain, swelling, or warmth in the leg, shortness of breath, chest pain Fever, neck pain or stiffness, sensitivity to light, headache, nausea, vomiting, confusion, which may be signs of meningitis Hemolytic anemia--unusual weakness or fatigue, dizziness, headache, trouble breathing, dark urine, yellowing skin or eyes Kidney injury--decrease in the amount of urine, swelling of the ankles, hands, or feet Low sodium level--muscle weakness, fatigue, dizziness, headache, confusion Shortness of breath or trouble breathing, cough, unusual weakness or fatigue, blue skin or lips Side effects that usually do not require medical attention (report these to your care team if they continue or are bothersome): Chills Diarrhea Fever Headache Nausea This list may not describe all possible side effects. Call your doctor for medical advice about side effects. You may report side effects to FDA at 1-800-FDA-1088. Where should I keep my medication? Keep out of the reach of children and pets. You will be instructed on how to store this medication. Get rid of  any unused medication after the expiration date. To get rid of medications that are no longer needed or have expired: Take the medication to a medication take-back program. Check with your pharmacy or law enforcement to find a location. If you cannot return the medication, ask your pharmacist or care team how to get rid of this medication safely. NOTE: This sheet is a summary. It may not cover all possible information. If you have questions about this medicine, talk to your doctor, pharmacist, or health care provider.  2025 Elsevier/Gold Standard (2023-09-26 00:00:00)

## 2024-04-12 ENCOUNTER — Encounter: Payer: Self-pay | Admitting: Internal Medicine

## 2024-04-16 ENCOUNTER — Ambulatory Visit
Admission: RE | Admit: 2024-04-16 | Discharge: 2024-04-16 | Disposition: A | Discharge status: Home / Self Care | Source: Ambulatory Visit | Attending: Internal Medicine | Admitting: Internal Medicine

## 2024-04-16 DIAGNOSIS — C9000 Multiple myeloma not having achieved remission: Secondary | ICD-10-CM | POA: Insufficient documentation

## 2024-04-16 DIAGNOSIS — I700 Atherosclerosis of aorta: Secondary | ICD-10-CM | POA: Insufficient documentation

## 2024-04-16 LAB — GLUCOSE, CAPILLARY: Glucose-Capillary: 93 mg/dL (ref 70–99)

## 2024-04-16 MED ORDER — FLUDEOXYGLUCOSE F - 18 (FDG) INJECTION
7.9000 | Freq: Once | INTRAVENOUS | Status: AC | PRN
Start: 2024-04-16 — End: 2024-04-16
  Administered 2024-04-16: 7.76 via INTRAVENOUS

## 2024-04-17 ENCOUNTER — Encounter: Payer: Self-pay | Admitting: Internal Medicine

## 2024-04-17 ENCOUNTER — Inpatient Hospital Stay (HOSPITAL_BASED_OUTPATIENT_CLINIC_OR_DEPARTMENT_OTHER): Admitting: Internal Medicine

## 2024-04-17 ENCOUNTER — Inpatient Hospital Stay

## 2024-04-17 VITALS — BP 159/58 | HR 57 | Resp 20

## 2024-04-17 VITALS — BP 142/73 | HR 58 | Temp 98.0°F | Resp 20 | Ht 62.0 in | Wt 152.0 lb

## 2024-04-17 DIAGNOSIS — C9000 Multiple myeloma not having achieved remission: Secondary | ICD-10-CM

## 2024-04-17 DIAGNOSIS — Z5112 Encounter for antineoplastic immunotherapy: Secondary | ICD-10-CM | POA: Diagnosis not present

## 2024-04-17 LAB — CBC WITH DIFFERENTIAL (CANCER CENTER ONLY)
Abs Immature Granulocytes: 0.01 K/uL (ref 0.00–0.07)
Basophils Absolute: 0 K/uL (ref 0.0–0.1)
Basophils Relative: 1 %
Eosinophils Absolute: 0 K/uL (ref 0.0–0.5)
Eosinophils Relative: 1 %
HCT: 33.9 % — ABNORMAL LOW (ref 36.0–46.0)
Hemoglobin: 10.8 g/dL — ABNORMAL LOW (ref 12.0–15.0)
Immature Granulocytes: 0 %
Lymphocytes Relative: 51 %
Lymphs Abs: 1.7 K/uL (ref 0.7–4.0)
MCH: 30 pg (ref 26.0–34.0)
MCHC: 31.9 g/dL (ref 30.0–36.0)
MCV: 94.2 fL (ref 80.0–100.0)
Monocytes Absolute: 0.4 K/uL (ref 0.1–1.0)
Monocytes Relative: 11 %
Neutro Abs: 1.3 K/uL — ABNORMAL LOW (ref 1.7–7.7)
Neutrophils Relative %: 36 %
Platelet Count: 266 K/uL (ref 150–400)
RBC: 3.6 MIL/uL — ABNORMAL LOW (ref 3.87–5.11)
RDW: 15.3 % (ref 11.5–15.5)
Smear Review: NORMAL
WBC Count: 3.4 K/uL — ABNORMAL LOW (ref 4.0–10.5)
nRBC: 0 % (ref 0.0–0.2)

## 2024-04-17 LAB — CMP (CANCER CENTER ONLY)
ALT: 14 U/L (ref 0–44)
AST: 18 U/L (ref 15–41)
Albumin: 3.8 g/dL (ref 3.5–5.0)
Alkaline Phosphatase: 67 U/L (ref 38–126)
Anion gap: 9 (ref 5–15)
BUN: 12 mg/dL (ref 8–23)
CO2: 20 mmol/L — ABNORMAL LOW (ref 22–32)
Calcium: 8.1 mg/dL — ABNORMAL LOW (ref 8.9–10.3)
Chloride: 110 mmol/L (ref 98–111)
Creatinine: 0.54 mg/dL (ref 0.44–1.00)
GFR, Estimated: >60 mL/min (ref >60)
Glucose, Bld: 93 mg/dL (ref 70–99)
Potassium: 3.7 mmol/L (ref 3.5–5.1)
Sodium: 139 mmol/L (ref 135–145)
Total Bilirubin: 1 mg/dL (ref 0.0–1.2)
Total Protein: 7 g/dL (ref 6.5–8.1)

## 2024-04-17 MED ORDER — DEXTROSE 5 % IV SOLN
56.0000 mg/m2 | Freq: Once | INTRAVENOUS | Status: AC
  Administered 2024-04-17: 100 mg via INTRAVENOUS
  Filled 2024-04-17: qty 30

## 2024-04-17 MED ORDER — SODIUM CHLORIDE 0.9 % IV SOLN
10.0000 mg/kg | Freq: Once | INTRAVENOUS | Status: AC
  Administered 2024-04-17: 700 mg via INTRAVENOUS
  Filled 2024-04-17: qty 25

## 2024-04-17 MED ORDER — FAMOTIDINE IN NACL 20-0.9 MG/50ML-% IV SOLN
20.0000 mg | Freq: Once | INTRAVENOUS | Status: AC
  Administered 2024-04-17: 20 mg via INTRAVENOUS
  Filled 2024-04-17: qty 50

## 2024-04-17 MED ORDER — DEXAMETHASONE SODIUM PHOSPHATE 10 MG/ML IJ SOLN
5.0000 mg | Freq: Once | INTRAMUSCULAR | Status: AC
  Administered 2024-04-17: 5 mg via INTRAVENOUS
  Filled 2024-04-17: qty 1

## 2024-04-17 MED ORDER — DIPHENHYDRAMINE HCL 50 MG/ML IJ SOLN
50.0000 mg | Freq: Once | INTRAMUSCULAR | Status: AC
  Administered 2024-04-17: 50 mg via INTRAVENOUS
  Filled 2024-04-17: qty 1

## 2024-04-17 MED ORDER — SODIUM CHLORIDE 0.9 % IV SOLN
Freq: Once | INTRAVENOUS | Status: AC
  Filled 2024-04-17: qty 250

## 2024-04-17 MED ORDER — ACETAMINOPHEN 325 MG PO TABS
650.0000 mg | ORAL_TABLET | Freq: Once | ORAL | Status: AC
  Administered 2024-04-17: 650 mg via ORAL
  Filled 2024-04-17: qty 2

## 2024-04-17 MED ORDER — TRAMADOL HCL 50 MG PO TABS: 50.0000 mg | ORAL_TABLET | Freq: Two times a day (BID) | ORAL | 0 refills | Status: DC | PRN

## 2024-04-17 NOTE — Patient Instructions (Signed)

## 2024-04-17 NOTE — Progress Notes (Signed)
 PET 04/16/24. Having an echo 04/23/24.  Needs refill on tramadol , pended.

## 2024-04-17 NOTE — Assessment & Plan Note (Addendum)
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   MAY 2024- 1.3. ]  currently on Isa-Dex; d-2 carfil weekly q 28 days. # MARCH 2025- M-protein -0.3 ; kappa lambda light chain ratio-1.8 lambda light chain 63-slowly RISING FEB 2025-chemotherapy was interrupted-until MAY 2025- because of thigh infection needing wound VAC.    # S/p  re-evaluation at [facility] in June 2025- Labs c/w early progression. No evidence of end organ damage-  PET SEP 2025-  No findings of active myeloma.  low-grade activity in a 1.5 cm partially sclerotic lesion in the right distal femoral metaphysis, probably an enchondroma or similar incidental benign lesion. The appearance is not characteristic for myeloma;.   # GIVEN PET NEGATIVITY- For now continue with with  Isa-Dex-Carfil- Labs-CBC/chemistries were reviewed with the patient.     # DROP in EF:  MARCH 2024-Echo [[facility]] Left ventricular EF 50 to 55%; AUG 13th/OCT 23rd, 2024- EF-45-50%.  FEB 26th, 2025- Left ventricular ejection fraction, by estimation, is 50 to 55%. ECHO-pending 9/29-  # PN G-1-2 rozann sensitivity]- no pain/T or Numbness- continue symptomatic management  # Anemia- JAn 2025- I ron sat- 8%; ferritin- 19 on prn venofer - Stable.   # Electrolyte: Hypokalemia: mild-  continue compliance Kdur TID-; hypocalcemia- 8.0-    # Iatrogenic hypothyroidism [ Graves/goiter s/p total thyroidectomy;aug,2022 ]-currently on Synthroid  175 mcg/day-- elevated TSH- recommend spacing other medications.   # MAY 2025- thigh infection s/p wound VAC [Dr.Rodenberg]  proceed witjh IVIG next week. .   # Glaucoma BIL- L > R [Dr.King; [facility]] - ? Steroid induced on eye drops. decreased the steroids. Discussed with Dr.Haines, ophthalmology- Glaucoma under control. .   # chronic  Lower back discomfort - Continue tramadol - refilled . stable  # Bone lesions /last zometa  on 11/27/2017.  S/p   Dental  extraction PET scan evidence of any bone lesions.- consider Zometa   at next visit.   # Infection prophylaxis: Acyclovir ; asprin- add IVIG infusion [IgG < 400]- plan  IVIG- q 4 w #1 on- 9/17- Stable.   # IV access: mediport- functional  # ACP: Patient has  incurable cancer.  However patient is currently doing well. Patient interested in continue current scope of therapy; and full code.  6 w- given dental extrac- on 1/26. :MM panel; K/l light chain ratio q 4 W  ; NO zometa - dental issues-ok; but hypocalemia  Starting aug 25th- q2w-MD: D-isa-dex;car1&2-car; weekly x 3; week 4-OFF;  q 28 day cycle- pushed off to 6 weeks- 11/04 PS:  # DISPOSITION: # order standing orders for 4 weeks- MM panel/k-l light chains- # chemo today-this week- ok without echo # weekly port-cbc/cmp # add venofer  once a week with chemos x 3  # follow up as per IS-  -Dr.B

## 2024-04-17 NOTE — Progress Notes (Signed)
 [facility] OFFICE PROGRESS NOTE  Patient Care Team: Buren Rock HERO, MD as PCP - General (Family Medicine) End, Lonni, MD as PCP - Cardiology (Cardiology) Guinevere File, [name] Rennie Cindy SAUNDERS, MD as Consulting Physician (Oncology) Parris Manna, MD as Consulting Physician (Pulmonary Disease) Damian Therisa HERO, MD as Consulting Physician (Endocrinology)   Cancer Staging  Multiple myeloma not having achieved remission [facility]) Staging form: Plasma Cell Myeloma and Plasma Cell Disorders, AJCC 8th Edition - Clinical: No stage assigned - Unsigned - Clinical: No stage assigned - Unsigned   Oncology History Overview Note  # SEP 2018- MULTIPLE MYELOMA IgALamda [2.5 gm/dl; K/L= 05/8700]; STAGE III [beta 2 microglobulin=5.5] [presented with acute renal failure; anemia; NO hypercalcemia; Skeletal survey-Normal]; BMBx- 45% plasma cells; FISH-POSITIVE 11:14 translocation.[STANDARD-high RISK]/cyto-Normal; SEP 2018- PET- L3 posterior element lesion.   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 No morphologic support for residual myeloma disease. Negative for minimal residual disease by MM-MRD flow cytometry; HOLD REVLIMID  [leg swelling]; AUG 2021-PET scan negative for myeloma; continue to hold Revlimid   # MARCH 2021-diastolic congestive heart failure [Dr.End]  # BMBx- OCT 2021- BMBx- 5% plasmacytosis-however this appears to be more polyclonal  rather than monoclonal-not explain patient worsening anemia.   # OCT 2022- 18th- Dara- Rev-Dex; MARCH, 2024- PROGRESSION bone marrow biopsy 70% plasma cells; PET scan no bone lesions-however conus medullaris uptake-MRI lumbar spine-   # MAY 2024- DISCONTINUE carfilzomib -dexamethasone - Pomalyst ; [rising M-protein; MAY 2024- 1.3. ]   # start 12/27/2022 -Isa-Carfil-Dex;  [decrease dexamethasone  20 weekly- starting 05/04/23- sec to glaucoma].   # OCT 24th, 2024- Bone marrow Biopsy: The findings are consistent with  focal minimal residual lambda restricted plasma cell neoplasm- HOLD treatment sec to glaucoma; and drop in EF AUG 2024- 45-50% [March 2024- 50-55%]  # MARCH 16th, 2025- re-started Isa-Carfil-Dex;  #November 2021-hyperthyroidism/goiter- [D.Bennett/Dr.Solum]-methimazole . S/p Thyroidectomy [Dr.Kim; [facility]-AUG 2022]  --------------------------  # 12/12- RIGHT JUGULAR DVT-x 42m on xarelto ; finished April 2020; September 2020-EGD/dysphagia; Dr. Therisa  # Acute renal failure [Dr.Singh; Proteinuria 1.5gm/day ]; acyclovir Mickeal ------------------------------------------------------------------------------------------------------------   DIAGNOSIS: [ ]  MULTIPLE MYELOMA  STAGE: III/HIGH RISK ;GOALS: CONTROL      Multiple myeloma not having achieved remission (HCC)  11/21/2017 - 04/28/2018 Chemotherapy   Patient is on Treatment Plan : MYELOMA SALVAGE Cyclophosphamide  / Carfilzomib  / Dexamethasone  (CCd) q28d     05/12/2021 - 02/25/2022 Chemotherapy   Patient is on Treatment Plan : MYELOMA RELAPSED REFRACTORY Daratumumab  SQ + Lenalidomide  + Dexamethasone  (DaraRd) q28d     05/12/2021 - 07/23/2022 Chemotherapy   Patient is on Treatment Plan : MYELOMA RELAPSED REFRACTORY Daratumumab  SQ + Lenalidomide  + Dexamethasone  (DaraRd) q28d     11/02/2022 - 11/16/2022 Chemotherapy   Patient is on Treatment Plan : MYELOMA RELAPSED/ REFRACTORY Carfilzomib  D1,8,15 (20/27) + Pomalidomide  + Dexamethasone  (KPd) q28d      12/27/2022 -  Chemotherapy   Patient is on Treatment Plan : MYELOMA Carfilzomib  + Dexamethasone  + Isatuximab  q28d      INTERVAL HISTORY: Ambulating  independently.  Alone.  [place]  E Faiola 73 y.o.  female pleasant patient above history of relapsed multiple myeloma--currently re-started back after 3 months break [JULY 28th, 2025]  Isa- Carfil-Dex  is here for a follow up.   Notes to have cold sensitivity in hand and feet- no tingling or numbness.   No further signs of of infection.  No fevers or chills.. Denies any worsening pain. Patient taking tramadol  as needed for upper and leg cramps.   No difficulty breathing. No worsening leg swelling.    Review of Systems  Constitutional:  Positive for malaise/fatigue. Negative for chills, diaphoresis and fever.  HENT:  Negative for nosebleeds and sore throat.   Eyes:  Negative for double vision.  Respiratory:  Negative for hemoptysis.   Cardiovascular:  Negative for chest pain, palpitations and orthopnea.  Gastrointestinal:  Negative for abdominal pain, blood in stool, constipation, heartburn and melena.  Genitourinary:  Negative for dysuria, frequency and urgency.  Musculoskeletal:  Positive for back pain.  Skin: Negative.  Negative for itching and rash.  Neurological:  Negative for dizziness, tingling, focal weakness, weakness and headaches.  Endo/Heme/Allergies:  Does not bruise/bleed easily.  Psychiatric/Behavioral:  Negative for depression. The patient is not nervous/anxious.     PAST MEDICAL HISTORY :  Past Medical History:  Diagnosis Date   (HFpEF) heart failure with preserved ejection fraction (HCC)    a. 06/2020 Echo: EF 60-65%, no rwma, nl RV size/fxn. Triv MR. Triv TR/AI. Mod elev PASP.   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   Open wound of left thigh 12/15/2023   Pneumonia    Red blood cell antibody positive, compatible PRBC difficult to obtain    S/P autologous bone marrow transplantation (HCC)    Sepsis (HCC) 11/25/2023    PAST SURGICAL HISTORY :   Past Surgical History:  Procedure Laterality Date   ANTERIOR VITRECTOMY Left 10/07/2021   Procedure: ANTERIOR VITRECTOMY;  Surgeon: Mittie Gaskin, MD;  Location: [facility];  Service: Ophthalmology;  Laterality: Left;   CATARACT EXTRACTION W/PHACO Left 10/07/2021   Procedure: CATARACT EXTRACTION PHACO AND INTRAOCULAR LENS PLACEMENT (IOC) LEFT VISION BLUE;  Surgeon: Mittie Gaskin, MD;  Location: [facility];  Service: Ophthalmology;  Laterality: Left;  kit for manual incision 14.56 01:21.4   CATARACT EXTRACTION W/PHACO Right 10/21/2021   Procedure: CATARACT EXTRACTION PHACO AND INTRAOCULAR LENS PLACEMENT (IOC) RIGHT 11.12 01:48.1;  Surgeon: Mittie Gaskin, MD;  Location: [facility];  Service: Ophthalmology;  Laterality: Right;   ESOPHAGOGASTRODUODENOSCOPY (EGD) WITH PROPOFOL  N/A 03/30/2019   Procedure: ESOPHAGOGASTRODUODENOSCOPY (EGD) WITH PROPOFOL ;  Surgeon: Therisa Bi, MD;  Location: [facility];  Service: Gastroenterology;  Laterality: N/A;   INCISION AND DRAINAGE ABSCESS Left 12/01/2023   Procedure: INCISION AND DRAINAGE, ABSCESS;  Surgeon: Lane Shope, MD;  Location: [facility];  Service: General;  Laterality: Left;   IR BONE MARROW BIOPSY & ASPIRATION  05/25/2023   IR FLUORO GUIDE PORT INSERTION RIGHT  12/16/2017   IR IMAGING GUIDED PORT INSERTION  06/15/2021   KNEE ARTHROSCOPY Left 09/03/2022   Procedure: ARTHROSCOPY KNEE DEBRIDEMENT;  Surgeon: Leora Lynwood SAUNDERS, MD;  Location: [facility];  Service: Orthopedics;  Laterality: Left;   THYROIDECTOMY N/A     FAMILY HISTORY :  Family History  Problem Relation Age of Onset    Pneumonia Mother    Seizures Father     SOCIAL HISTORY:   Social History   Tobacco Use   Smoking status: Former    Current packs/day: 0.00    Types: Cigarettes    Quit date: 03/18/2021    Years since quitting: 3.0   Smokeless tobacco: Never   Tobacco comments:    2 cigarettes QOD  Vaping Use   Vaping status: Never Used  Substance Use Topics   Alcohol use: No   Drug use: No    ALLERGIES:  has no known allergies.  MEDICATIONS:  Current Outpatient Medications  Medication Sig Dispense Refill   acyclovir  (ZOVIRAX ) 400 MG tablet Take 1 tablet (400 mg total) by mouth 2 (two) times daily. 120 tablet 2   albuterol  (VENTOLIN  HFA) 108 (90 Base) MCG/ACT inhaler Inhale 2 puffs into the lungs every 6 (six) hours as needed for wheezing or shortness of breath. 8 g 3   aspirin  EC 81 MG EC tablet Take 1 tablet (81 mg total) by mouth daily. 90 tablet 3   Brinzolamide -Brimonidine  (SIMBRINZA) 1-0.2 % SUSP Apply 1 drop to eye in the morning and at bedtime. I drop both eyes twice a day     Calcium  Carb-Cholecalciferol 600-10 MG-MCG TABS Take 1 tablet by mouth daily.     calcium  carbonate (CALCIUM  600 HIGH POTENCY) 600 MG TABS tablet Take 2 tablets (1,200 mg total) by mouth 2 (two) times daily. 120 tablet 2   furosemide  (LASIX ) 40 MG tablet Take 1 tablet (40 mg total) by mouth daily as needed for edema or fluid.     levothyroxine  (SYNTHROID ) 175 MCG tablet Take 1 tablet (175 mcg total) by mouth daily. For thyroid  90 tablet 1   lidocaine -prilocaine  (EMLA ) cream Apply 1 application topically as needed. Apply to port and cover with saran wrap 1-2 hours prior to port access 30 g 1   magnesium  oxide (MAG-OX) 400 (240 Mg) MG tablet Take 1 tablet (400 mg total) by mouth 2 (two) times daily. 60 tablet 3   montelukast  (SINGULAIR ) 10 MG tablet Take 1 tablet (10 mg total) by mouth at bedtime. 30 tablet 3   NIFEdipine (ADALAT CC) 30 MG 24 hr tablet Take 30 mg by mouth daily.     pantoprazole  (PROTONIX ) 40 MG  tablet Take 1 tablet (40 mg total) by mouth daily. 90 tablet 0   polyethylene glycol (MIRALAX  / GLYCOLAX ) packet Take 17 g by mouth daily as needed for mild constipation. 14 each 0   spironolactone  (ALDACTONE ) 25 MG tablet Take 1 tablet (25 mg total) by mouth daily as needed (edema). 30 tablet 1   Vitamin D , Ergocalciferol , (DRISDOL ) 1.25 MG (50000 UNIT) CAPS capsule Take 1 capsule by mouth once a week 12 capsule 0   traMADol  (ULTRAM ) 50 MG tablet Take 1 tablet (50 mg total) by mouth every 12 (twelve) hours as needed. 60 tablet 0   No current facility-administered medications for this visit.   Facility-Administered Medications Ordered in Other Visits  Medication Dose Route Frequency Provider Last Rate Last Admin   0.9 %  sodium chloride  infusion   Intravenous Once Arilynn Blakeney R, MD       acetaminophen  (TYLENOL ) tablet 650 mg  650 mg Oral Once Kisha Messman R, MD       carfilzomib  (KYPROLIS ) 100 mg in dextrose  5 % 100 mL chemo infusion  56 mg/m2 (Treatment Plan Recorded) Intravenous Once Avereigh Spainhower R,  MD       dexamethasone  (DECADRON ) injection 5 mg  5 mg Intravenous Once Myah Guynes R, MD       diphenhydrAMINE  (BENADRYL ) injection 50 mg  50 mg Intravenous Once Lyrique Hakim R, MD       famotidine  (PEPCID ) IVPB 20 mg premix  20 mg Intravenous Once Marquette Piontek R, MD       isatuximab -irfc (SARCLISA ) 700 mg in sodium chloride  0.9 % 215 mL (2.8 mg/mL) chemo infusion  10 mg/kg (Treatment Plan Recorded) Intravenous Once Hamsini Verrilli R, MD        PHYSICAL EXAMINATION:   BP (!) 142/73 (BP Location: Right Arm, Patient Position: Sitting, Cuff Size: Normal) Comment: pt advised bp elevated, keep check at home, contact pcp if con't to stay elevated  Pulse (!) 58   Temp 98 F (36.7 C) (Tympanic)   Resp 20   Ht 5' 2 (1.575 m)   Wt 152 lb (68.9 kg)   SpO2 100%   BMI 27.80 kg/m   Filed Weights   04/17/24 0840  Weight: 152 lb (68.9 kg)          Mild leg swelling.   Physical Exam HENT:     Head: Normocephalic and atraumatic.  Eyes:     Pupils: Pupils are equal, round, and reactive to light.  Cardiovascular:     Rate and Rhythm: Normal rate and regular rhythm.  Pulmonary:     Effort: Pulmonary effort is normal. No respiratory distress.     Breath sounds: Normal breath sounds. No wheezing.  Abdominal:     General: Bowel sounds are normal. There is no distension.     Palpations: Abdomen is soft. There is no mass.     Tenderness: There is no abdominal tenderness. There is no guarding or rebound.  Musculoskeletal:        General: No tenderness. Normal range of motion.     Cervical back: Normal range of motion and neck supple.  Skin:    General: Skin is warm.  Neurological:     Mental Status: She is alert and oriented to person, place, and time.  Psychiatric:        Mood and Affect: Affect normal.    LABORATORY DATA:  I have reviewed the data as listed    Component Value Date/Time   NA 139 04/17/2024 0840   NA 141 01/25/2020 1530   K 3.7 04/17/2024 0840   CL 110 04/17/2024 0840   CO2 20 (L) 04/17/2024 0840   GLUCOSE 93 04/17/2024 0840   BUN 12 04/17/2024 0840   BUN 19 01/25/2020 1530   CREATININE 0.54 04/17/2024 0840   CALCIUM  8.1 (L) 04/17/2024 0840   PROT 7.0 04/17/2024 0840   ALBUMIN 3.8 04/17/2024 0840   AST 18 04/17/2024 0840   ALT 14 04/17/2024 0840   ALKPHOS 67 04/17/2024 0840   BILITOT 1.0 04/17/2024 0840   GFRNONAA >60 04/17/2024 0840   GFRAA NOT CALCULATED 04/17/2020 1352    No results found for: SPEP, UPEP  Lab Results  Component Value Date   WBC 3.4 (L) 04/17/2024   NEUTROABS 1.3 (L) 04/17/2024   HGB 10.8 (L) 04/17/2024   HCT 33.9 (L) 04/17/2024   MCV 94.2 04/17/2024   PLT 266 04/17/2024      Chemistry      Component Value Date/Time   NA 139 04/17/2024 0840   NA 141 01/25/2020 1530   K 3.7 04/17/2024 0840   CL 110 04/17/2024 0840  CO2 20 (L) 04/17/2024 0840    BUN 12 04/17/2024 0840   BUN 19 01/25/2020 1530   CREATININE 0.54 04/17/2024 0840      Component Value Date/Time   CALCIUM  8.1 (L) 04/17/2024 0840   ALKPHOS 67 04/17/2024 0840   AST 18 04/17/2024 0840   ALT 14 04/17/2024 0840   BILITOT 1.0 04/17/2024 0840      (H): Data is abnormally high   Latest Reference Range & Units Most Recent 12/24/20 14:01 04/01/21 14:34 04/28/21 09:17 06/11/21 09:42 07/03/21 10:32 08/20/21 10:06  M Protein SerPl Elph-Mcnc Not Observed g/dL 1.1 (H) (C) 8/73/76 89:93 Not Observed (C) 0.6 (H) (C) 0.7 (H) (C) 0.5 (H) (C) 0.7 (H) (C) 1.1 (H) (C)    Latest Reference Range & Units Most Recent 09/17/21 08:47 10/15/21 08:25 11/12/21 08:55  Kappa free light chain 3.3 - 19.4 mg/L 5.0 11/12/21 08:55 7.2 7.2 5.0  Lambda free light chains 5.7 - 26.3 mg/L 223.6 (H) 11/12/21 08:55 215.3 (H) 340.2 (H) 223.6 (H)  Kappa, lambda light chain ratio 0.26 - 1.65  0.02 (L) 11/12/21 08:55 0.03 (L) 0.02 (L) 0.02 (L)  (H): Data is abnormally high (L): Data is abnormally low  RADIOGRAPHIC STUDIES: I have personally reviewed the radiological images as listed and agreed with the findings in the report. NM PET Image Restage (PS) Whole Body (F-18 FDG) Result Date: 04/16/2024 CLINICAL DATA:  Subsequent treatment strategy for multiple myeloma. EXAM: NUCLEAR MEDICINE PET WHOLE BODY TECHNIQUE: 7.8 mCi F-18 FDG was injected intravenously feel left antecubital injection. Full-ring PET imaging was performed from the head to foot after the radiotracer. CT data was obtained and used for attenuation correction and anatomic localization. Fasting blood glucose: Ninety-three mg/dl COMPARISON:  Multiple exams, including PET-CT 09/30/2022 FINDINGS: Mediastinal blood pool activity: SUV max 2.3 HEAD/NECK: No significant abnormal hypermetabolic activity in this region. Incidental CT findings: Bilateral common carotid atheromatous vascular calcification. CHEST: No significant abnormal hypermetabolic activity in  this region. Incidental CT findings: Coronary, aortic arch, and branch vessel atherosclerotic vascular disease. Right Port-A-Cath tip: Lower SVC. Mild cardiomegaly. ABDOMEN/PELVIS: Physiologic activity in bowel. Incidental CT findings: Atherosclerosis is present, including aortoiliac atherosclerotic disease. Calcified posterior fundal fibroid in the uterus. Rim calcified structure along the left adnexa is unchanged from 09/30/2022 and likely benign/incidental. Suspected pelvic floor laxity. SKELETON: Low-grade activity in a 1.5 cm partially sclerotic lesion in the right distal femoral metaphysis with maximum SUV 2.4 (formerly 3.1) probably an enchondroma or similar incidental benign lesion. The appearance is not characteristic for myeloma. Overall no findings of active biloma are identified. Incidental CT findings: Spondylosis. EXTREMITIES: No significant abnormal hypermetabolic activity in this region. Incidental CT findings: none IMPRESSION: 1. No findings of active myeloma. 2. Low-grade activity in a 1.5 cm partially sclerotic lesion in the right distal femoral metaphysis, probably an enchondroma or similar incidental benign lesion. The appearance is not characteristic for myeloma. 3.  Aortic Atherosclerosis (ICD10-I70.0). Electronically Signed   By: Ryan Salvage M.D.   On: 04/16/2024 17:32     ASSESSMENT & PLAN:  Multiple myeloma not having achieved remission (HCC) # RECURRENT Multiple myeloma stage III [high-risk cytogenetics-status post KRD-VGPR ; status post autologous stem cell transplant on 06/15/18.   MAY 2024- 1.3. ]  currently on Isa-Dex; d-2 carfil weekly q 28 days. # MARCH 2025- M-protein -0.3 ; kappa lambda light chain ratio-1.8 lambda light chain 63-slowly RISING FEB 2025-chemotherapy was interrupted-until MAY 2025- because of thigh infection needing wound VAC.    # S/p  re-evaluation at [facility] in June 2025- Labs c/w early progression. No evidence of end organ damage-  PET SEP 2025-  No  findings of active myeloma.  low-grade activity in a 1.5 cm partially sclerotic lesion in the right distal femoral metaphysis, probably an enchondroma or similar incidental benign lesion. The appearance is not characteristic for myeloma;.   # GIVEN PET NEGATIVITY- For now continue with with  Isa-Dex-Carfil- Labs-CBC/chemistries were reviewed with the patient.     # DROP in EF:  MARCH 2024-Echo [[facility]] Left ventricular EF 50 to 55%; AUG 13th/OCT 23rd, 2024- EF-45-50%.  FEB 26th, 2025- Left ventricular ejection fraction, by estimation, is 50 to 55%. ECHO-pending 9/29-  # PN G-1-2 rozann sensitivity]- no pain/T or Numbness- continue symptomatic management  # Anemia- JAn 2025- I ron sat- 8%; ferritin- 19 on prn venofer - Stable.   # Electrolyte: Hypokalemia: mild-  continue compliance Kdur TID-; hypocalcemia- 8.0-    # Iatrogenic hypothyroidism [ Graves/goiter s/p total thyroidectomy;aug,2022 ]-currently on Synthroid  175 mcg/day-- elevated TSH- recommend spacing other medications.   # MAY 2025- thigh infection s/p wound VAC [Dr.Rodenberg]  proceed witjh IVIG next week. .   # Glaucoma BIL- L > R [Dr.King; [facility]] - ? Steroid induced on eye drops. decreased the steroids. Discussed with Dr.Haines, ophthalmology- Glaucoma under control. .   # chronic  Lower back discomfort - Continue tramadol - refilled . stable  # Bone lesions /last zometa  on 11/27/2017.  S/p   Dental extraction PET scan evidence of any bone lesions.- consider Zometa   at next visit.   # Infection prophylaxis: Acyclovir ; asprin- add IVIG infusion [IgG < 400]- plan  IVIG- q 4 w #1 on- 9/17- Stable.   # IV access: mediport- functional  # ACP: Patient has  incurable cancer.  However patient is currently doing well. Patient interested in continue current scope of therapy; and full code.  6 w- given dental extrac- on 1/26. :MM panel; K/l light chain ratio q 4 W  ; NO zometa - dental issues-ok; but hypocalemia  Starting aug 25th-  q2w-MD: D-isa-dex;car1&2-car; weekly x 3; week 4-OFF;  q 28 day cycle- pushed off to 6 weeks- 11/04 PS:  # DISPOSITION: # order standing orders for 4 weeks- MM panel/k-l light chains- # chemo today-this week- ok without echo # weekly port-cbc/cmp # add venofer  once a week with chemos x 3  # follow up as per IS-  -Dr.B   Orders Placed This Encounter  Procedures   CBC with Differential (Cancer Center Only)    Standing Status:   Future    Expected Date:   05/29/2024    Expiration Date:   05/29/2025   CMP (Cancer Center only)    Standing Status:   Future    Expected Date:   05/29/2024    Expiration Date:   05/29/2025   CBC with Differential (Cancer Center Only)    Standing Status:   Future    Expected Date:   06/05/2024    Expiration Date:   06/05/2025   CMP (Cancer Center only)    Standing Status:   Future    Expected Date:   06/05/2024    Expiration Date:   06/05/2025   CBC with Differential (Cancer Center Only)    Standing Status:   Future    Expected Date:   06/12/2024    Expiration Date:   06/12/2025   CMP (Cancer Center only)    Standing Status:   Future    Expected Date:  06/12/2024    Expiration Date:   06/12/2025   All questions were answered. The patient knows to call the clinic with any problems, questions or concerns.      Cindy JONELLE Joe, MD 04/17/2024 9:50 AM End

## 2024-04-17 NOTE — Patient Instructions (Signed)

## 2024-04-18 ENCOUNTER — Encounter: Payer: Self-pay | Admitting: Internal Medicine

## 2024-04-18 ENCOUNTER — Other Ambulatory Visit: Payer: Self-pay

## 2024-04-18 ENCOUNTER — Inpatient Hospital Stay

## 2024-04-18 VITALS — BP 147/77 | HR 72 | Temp 98.3°F

## 2024-04-18 DIAGNOSIS — Z5112 Encounter for antineoplastic immunotherapy: Secondary | ICD-10-CM | POA: Diagnosis not present

## 2024-04-18 DIAGNOSIS — C9000 Multiple myeloma not having achieved remission: Secondary | ICD-10-CM

## 2024-04-18 MED ORDER — SODIUM CHLORIDE 0.9% FLUSH
10.0000 mL | INTRAVENOUS | Status: DC | PRN
  Administered 2024-04-18: 10 mL
  Filled 2024-04-18: qty 10

## 2024-04-18 MED ORDER — DEXAMETHASONE SODIUM PHOSPHATE 10 MG/ML IJ SOLN
5.0000 mg | Freq: Once | INTRAMUSCULAR | Status: AC
  Administered 2024-04-18: 5 mg via INTRAVENOUS
  Filled 2024-04-18: qty 1

## 2024-04-18 MED ORDER — DEXTROSE 5 % IV SOLN
56.0000 mg/m2 | Freq: Once | INTRAVENOUS | Status: AC
  Administered 2024-04-18: 100 mg via INTRAVENOUS
  Filled 2024-04-18: qty 5

## 2024-04-18 MED ORDER — SODIUM CHLORIDE 0.9 % IV SOLN
Freq: Once | INTRAVENOUS | Status: AC
  Filled 2024-04-18: qty 250

## 2024-04-18 NOTE — Patient Instructions (Signed)
 [facility] - A DEPT OF [facility]  Discharge Instructions: Thank you for choosing [facility] to provide your oncology and hematology care.  If you have a lab appointment with the Cancer Center, please go directly to the Cancer Center and check in at the registration area.  Wear comfortable clothing and clothing appropriate for easy access to any Portacath or PICC line.   We strive to give you quality time with your provider. You may need to reschedule your appointment if you arrive late (15 or more minutes).  Arriving late affects you and other patients whose appointments are after yours.  Also, if you miss three or more appointments without notifying the office, you may be dismissed from the clinic at the provider's discretion.      For prescription refill requests, have your pharmacy contact our office and allow 72 hours for refills to be completed.    Today you received the following chemotherapy and/or immunotherapy agents: carfilzomib    To help prevent nausea and vomiting after your treatment, we encourage you to take your nausea medication as directed.  BELOW ARE SYMPTOMS THAT SHOULD BE REPORTED IMMEDIATELY: *FEVER GREATER THAN 100.4 F (38 C) OR HIGHER *CHILLS OR SWEATING *NAUSEA AND VOMITING THAT IS NOT CONTROLLED WITH YOUR NAUSEA MEDICATION *UNUSUAL SHORTNESS OF BREATH *UNUSUAL BRUISING OR BLEEDING *URINARY PROBLEMS (pain or burning when urinating, or frequent urination) *BOWEL PROBLEMS (unusual diarrhea, constipation, pain near the anus) TENDERNESS IN MOUTH AND THROAT WITH OR WITHOUT PRESENCE OF ULCERS (sore throat, sores in mouth, or a toothache) UNUSUAL RASH, SWELLING OR PAIN  UNUSUAL VAGINAL DISCHARGE OR ITCHING   Items with * indicate a potential emergency and should be followed up as soon as possible or go to the Emergency Department if any problems should occur.  Please show the CHEMOTHERAPY ALERT CARD or IMMUNOTHERAPY  ALERT CARD at check-in to the Emergency Department and triage nurse.  Should you have questions after your visit or need to cancel or reschedule your appointment, please contact [facility] - A DEPT OF [facility]  [phone] and follow the prompts.  Office hours are 8:00 a.m. to 4:30 p.m. Monday - Friday. Please note that voicemails left after 4:00 p.m. may not be returned until the following business day.  We are closed weekends and major holidays. You have access to a nurse at all times for urgent questions. Please call the main number to the clinic [phone] and follow the prompts.  For any non-urgent questions, you may also contact your provider using MyChart. We now offer e-Visits for anyone 87 and older to request care online for non-urgent symptoms. For details visit [email or web address].   Also download the MyChart app! Go to the app store, search "MyChart", open the app, select , and log in with your MyChart username and password.

## 2024-04-21 ENCOUNTER — Other Ambulatory Visit (HOSPITAL_BASED_OUTPATIENT_CLINIC_OR_DEPARTMENT_OTHER): Payer: Self-pay

## 2024-04-23 ENCOUNTER — Ambulatory Visit: Attending: Internal Medicine

## 2024-04-23 DIAGNOSIS — I5032 Chronic diastolic (congestive) heart failure: Secondary | ICD-10-CM | POA: Diagnosis not present

## 2024-04-23 DIAGNOSIS — C9000 Multiple myeloma not having achieved remission: Secondary | ICD-10-CM

## 2024-04-23 LAB — ECHOCARDIOGRAM COMPLETE
AR max vel: 2.33 cm2
AV Area VTI: 2.33 cm2
AV Area mean vel: 2.26 cm2
AV Mean grad: 4 mmHg
AV Peak grad: 7.5 mmHg
Ao pk vel: 1.37 m/s
Area-P 1/2: 3.77 cm2
S' Lateral: 3.56 cm

## 2024-04-23 NOTE — Telephone Encounter (Signed)
 Pt was in office yesterday. DR. Ethyl had ordered a Barium Swallow test. When I put order in, a notice came up that another provider had same order in. After talking this over with Dr. Ethyl and he speaking with referring provider to us , it is felt best for rpt to follow up with GI provider in [facility] and to follow up with his order for Barium Swallow tests. I did call pt and spoke with daughter.

## 2024-04-24 ENCOUNTER — Inpatient Hospital Stay

## 2024-04-24 VITALS — BP 145/63 | HR 68 | Temp 95.0°F | Resp 18 | Wt 152.9 lb

## 2024-04-24 DIAGNOSIS — C9000 Multiple myeloma not having achieved remission: Secondary | ICD-10-CM

## 2024-04-24 DIAGNOSIS — Z5112 Encounter for antineoplastic immunotherapy: Secondary | ICD-10-CM | POA: Diagnosis not present

## 2024-04-24 LAB — CBC WITH DIFFERENTIAL (CANCER CENTER ONLY)
Abs Immature Granulocytes: 0.01 K/uL (ref 0.00–0.07)
Basophils Absolute: 0 K/uL (ref 0.0–0.1)
Basophils Relative: 0 %
Eosinophils Absolute: 0.1 K/uL (ref 0.0–0.5)
Eosinophils Relative: 2 %
HCT: 32.6 % — ABNORMAL LOW (ref 36.0–46.0)
Hemoglobin: 10.7 g/dL — ABNORMAL LOW (ref 12.0–15.0)
Immature Granulocytes: 0 %
Lymphocytes Relative: 33 %
Lymphs Abs: 1.5 K/uL (ref 0.7–4.0)
MCH: 30.6 pg (ref 26.0–34.0)
MCHC: 32.8 g/dL (ref 30.0–36.0)
MCV: 93.1 fL (ref 80.0–100.0)
Monocytes Absolute: 0.7 K/uL (ref 0.1–1.0)
Monocytes Relative: 15 %
Neutro Abs: 2.4 K/uL (ref 1.7–7.7)
Neutrophils Relative %: 50 %
Platelet Count: 61 K/uL — ABNORMAL LOW (ref 150–400)
RBC: 3.5 MIL/uL — ABNORMAL LOW (ref 3.87–5.11)
RDW: 15 % (ref 11.5–15.5)
WBC Count: 4.7 K/uL (ref 4.0–10.5)
nRBC: 1.1 % — ABNORMAL HIGH (ref 0.0–0.2)

## 2024-04-24 LAB — CMP (CANCER CENTER ONLY)
ALT: 10 U/L (ref 0–44)
AST: 16 U/L (ref 15–41)
Albumin: 3.8 g/dL (ref 3.5–5.0)
Alkaline Phosphatase: 59 U/L (ref 38–126)
Anion gap: 7 (ref 5–15)
BUN: 12 mg/dL (ref 8–23)
CO2: 20 mmol/L — ABNORMAL LOW (ref 22–32)
Calcium: 8 mg/dL — ABNORMAL LOW (ref 8.9–10.3)
Chloride: 112 mmol/L — ABNORMAL HIGH (ref 98–111)
Creatinine: 0.7 mg/dL (ref 0.44–1.00)
GFR, Estimated: >60 mL/min (ref >60)
Glucose, Bld: 83 mg/dL (ref 70–99)
Potassium: 3.5 mmol/L (ref 3.5–5.1)
Sodium: 139 mmol/L (ref 135–145)
Total Bilirubin: 1.6 mg/dL — ABNORMAL HIGH (ref 0.0–1.2)
Total Protein: 6.3 g/dL — ABNORMAL LOW (ref 6.5–8.1)

## 2024-04-24 MED ORDER — DEXTROSE 5 % IV SOLN
56.0000 mg/m2 | Freq: Once | INTRAVENOUS | Status: AC
  Administered 2024-04-24: 100 mg via INTRAVENOUS
  Filled 2024-04-24: qty 30

## 2024-04-24 MED ORDER — DEXAMETHASONE SODIUM PHOSPHATE 10 MG/ML IJ SOLN
5.0000 mg | Freq: Once | INTRAMUSCULAR | Status: AC
  Administered 2024-04-24: 5 mg via INTRAVENOUS
  Filled 2024-04-24: qty 1

## 2024-04-24 MED ORDER — SODIUM CHLORIDE 0.9 % IV SOLN
Freq: Once | INTRAVENOUS | Status: AC
  Filled 2024-04-24: qty 250

## 2024-04-24 NOTE — Progress Notes (Signed)
 Ok to tx with bilirubin  1.6 per dr Rennie

## 2024-04-25 ENCOUNTER — Inpatient Hospital Stay: Attending: Internal Medicine

## 2024-04-25 VITALS — BP 158/75 | HR 61 | Temp 97.5°F | Resp 17

## 2024-04-25 DIAGNOSIS — E876 Hypokalemia: Secondary | ICD-10-CM | POA: Insufficient documentation

## 2024-04-25 DIAGNOSIS — Z79620 Long term (current) use of immunosuppressive biologic: Secondary | ICD-10-CM | POA: Diagnosis not present

## 2024-04-25 DIAGNOSIS — C9002 Multiple myeloma in relapse: Secondary | ICD-10-CM | POA: Diagnosis not present

## 2024-04-25 DIAGNOSIS — Z5112 Encounter for antineoplastic immunotherapy: Secondary | ICD-10-CM | POA: Insufficient documentation

## 2024-04-25 DIAGNOSIS — C9000 Multiple myeloma not having achieved remission: Secondary | ICD-10-CM

## 2024-04-25 MED ORDER — IRON SUCROSE 20 MG/ML IV SOLN
200.0000 mg | Freq: Once | INTRAVENOUS | Status: AC
  Administered 2024-04-25: 200 mg via INTRAVENOUS
  Filled 2024-04-25: qty 10

## 2024-04-25 MED ORDER — DEXTROSE 5 % IV SOLN
56.0000 mg/m2 | Freq: Once | INTRAVENOUS | Status: AC
  Administered 2024-04-25: 100 mg via INTRAVENOUS
  Filled 2024-04-25: qty 30

## 2024-04-25 MED ORDER — DEXAMETHASONE SODIUM PHOSPHATE 10 MG/ML IJ SOLN
5.0000 mg | Freq: Once | INTRAMUSCULAR | Status: AC
  Administered 2024-04-25: 5 mg via INTRAVENOUS
  Filled 2024-04-25: qty 1

## 2024-04-25 MED ORDER — SODIUM CHLORIDE 0.9 % IV SOLN
Freq: Once | INTRAVENOUS | Status: AC
  Filled 2024-04-25: qty 250

## 2024-04-25 NOTE — Patient Instructions (Signed)
 Iron  Sucrose Injection What is this medication? IRON  SUCROSE (EYE ern SOO krose) treats low levels of iron  (iron  deficiency anemia) in people with kidney disease. Iron  is a mineral that plays an important role in making red blood cells, which carry oxygen from your lungs to the rest of your body. This medicine may be used for other purposes; ask your health care provider or pharmacist if you have questions. COMMON BRAND NAME(S): Venofer  What should I tell my care team before I take this medication? They need to know if you have any of these conditions: Anemia not caused by low iron  levels Heart disease High levels of iron  in the blood Kidney disease Liver disease An unusual or allergic reaction to iron , other medications, foods, dyes, or preservatives Pregnant or trying to get pregnant Breastfeeding How should I use this medication? This medication is infused into a vein. It is given by your care team in a hospital or clinic setting. Talk to your care team about the use of this medication in children. While it may be prescribed for children as young as 2 years for selected conditions, precautions do apply. Overdosage: If you think you have taken too much of this medicine contact a poison control [name] or emergency room at once. NOTE: This medicine is only for you. Do not share this medicine with others. What if I miss a dose? Keep appointments for follow-up doses. It is important not to miss your dose. Call your care team if you are unable to keep an appointment. What may interact with this medication? Do not take this medication with any of the following: Deferoxamine Dimercaprol  Other iron  products This medication may also interact with the following: Chloramphenicol Deferasirox This list may not describe all possible interactions. Give your health care provider a list of all the medicines, herbs, non-prescription drugs, or dietary supplements you use. Also tell them if you smoke,  drink alcohol, or use illegal drugs. Some items may interact with your medicine. What should I watch for while using this medication? Visit your care team for regular checks on your progress. Tell your care team if your symptoms do not start to get better or if they get worse. You may need blood work done while you are taking this medication. You may need to eat more foods that contain iron . Talk to your care team. Foods that contain iron  include whole grains or cereals, dried fruits, beans, peas, leafy green vegetables, and organ meats (liver, kidney). What side effects may I notice from receiving this medication? Side effects that you should report to your care team as soon as possible: Allergic reactions--skin rash, itching, hives, swelling of the face, lips, tongue, or throat Low blood pressure--dizziness, feeling faint or lightheaded, blurry vision Shortness of breath Side effects that usually do not require medical attention (report to your care team if they continue or are bothersome): Flushing Headache Joint pain Muscle pain Nausea Pain, redness, or irritation at injection site This list may not describe all possible side effects. Call your doctor for medical advice about side effects. You may report side effects to FDA at 1-800-FDA-1088. Where should I keep my medication? This medication is given in a hospital or clinic. It will not be stored at home. NOTE: This sheet is a summary. It may not cover all possible information. If you have questions about this medicine, talk to your doctor, pharmacist, or health care provider. [facility] - A DEPT OF . [facility]  Discharge Instructions: Thank you for choosing [name] to provide your oncology and hematology care.  If you have a lab appointment with the [name], please go directly to the [name] and check in at the registration area.  Wear comfortable clothing and clothing  appropriate for easy access to any Portacath or PICC line.   We strive to give you quality time with your provider. You may need to reschedule your appointment if you arrive late (15 or more minutes).  Arriving late affects you and other patients whose appointments are after yours.  Also, if you miss three or more appointments without notifying the office, you may be dismissed from the clinic at the provider's discretion.      For prescription refill requests, have your pharmacy contact our office and allow 72 hours for refills to be completed.    Today you received the following chemotherapy and/or immunotherapy agents:  carfilzomib     To help prevent nausea and vomiting after your treatment, we encourage you to take your nausea medication as directed.  BELOW ARE SYMPTOMS THAT SHOULD BE REPORTED IMMEDIATELY: *FEVER GREATER THAN 100.4 F (38 C) OR HIGHER *CHILLS OR SWEATING *NAUSEA AND VOMITING THAT IS NOT CONTROLLED WITH YOUR NAUSEA MEDICATION *UNUSUAL SHORTNESS OF BREATH *UNUSUAL BRUISING OR BLEEDING *URINARY PROBLEMS (pain or burning when urinating, or frequent urination) *BOWEL PROBLEMS (unusual diarrhea, constipation, pain near the anus) TENDERNESS IN MOUTH AND THROAT WITH OR WITHOUT PRESENCE OF ULCERS (sore throat, sores in mouth, or a toothache) UNUSUAL RASH, SWELLING OR PAIN  UNUSUAL VAGINAL DISCHARGE OR ITCHING   Items with * indicate a potential emergency and should be followed up as soon as possible or go to the Emergency Department if any problems should occur.  Please show the CHEMOTHERAPY ALERT CARD or IMMUNOTHERAPY ALERT CARD at check-in to the Emergency Department and triage nurse.  Should you have questions after your visit or need to cancel or reschedule your appointment, please contact [facility] - A DEPT OF [facility]  [phone] and follow the prompts.  Office hours are 8:00 a.m. to 4:30 p.m. Monday - Friday. Please note that  voicemails left after 4:00 p.m. may not be returned until the following business day.  We are closed weekends and major holidays. You have access to a nurse at all times for urgent questions. Please call the main number to the clinic [phone] and follow the prompts.  For any non-urgent questions, you may also contact your provider using MyChart. We now offer e-Visits for anyone 58 and older to request care online for non-urgent symptoms. For details visit [email or web address].   Also download the MyChart app! Go to the app store, search MyChart, open the app, select [facility], and log in with your MyChart username and password.

## 2024-05-01 ENCOUNTER — Encounter: Payer: Self-pay | Admitting: Internal Medicine

## 2024-05-01 ENCOUNTER — Inpatient Hospital Stay

## 2024-05-01 ENCOUNTER — Inpatient Hospital Stay (HOSPITAL_BASED_OUTPATIENT_CLINIC_OR_DEPARTMENT_OTHER): Admitting: Internal Medicine

## 2024-05-01 VITALS — BP 120/69 | HR 63 | Temp 98.0°F | Resp 20 | Ht 62.0 in | Wt 154.8 lb

## 2024-05-01 DIAGNOSIS — Z5112 Encounter for antineoplastic immunotherapy: Secondary | ICD-10-CM | POA: Diagnosis not present

## 2024-05-01 DIAGNOSIS — E876 Hypokalemia: Secondary | ICD-10-CM

## 2024-05-01 DIAGNOSIS — C9000 Multiple myeloma not having achieved remission: Secondary | ICD-10-CM

## 2024-05-01 DIAGNOSIS — E032 Hypothyroidism due to medicaments and other exogenous substances: Secondary | ICD-10-CM

## 2024-05-01 LAB — CBC WITH DIFFERENTIAL (CANCER CENTER ONLY)
Abs Immature Granulocytes: 0.01 K/uL (ref 0.00–0.07)
Basophils Absolute: 0 K/uL (ref 0.0–0.1)
Basophils Relative: 0 %
Eosinophils Absolute: 0.1 K/uL (ref 0.0–0.5)
Eosinophils Relative: 1 %
HCT: 29.6 % — ABNORMAL LOW (ref 36.0–46.0)
Hemoglobin: 9.6 g/dL — ABNORMAL LOW (ref 12.0–15.0)
Immature Granulocytes: 0 %
Lymphocytes Relative: 30 %
Lymphs Abs: 1.3 K/uL (ref 0.7–4.0)
MCH: 31.3 pg (ref 26.0–34.0)
MCHC: 32.4 g/dL (ref 30.0–36.0)
MCV: 96.4 fL (ref 80.0–100.0)
Monocytes Absolute: 0.5 K/uL (ref 0.1–1.0)
Monocytes Relative: 12 %
Neutro Abs: 2.4 K/uL (ref 1.7–7.7)
Neutrophils Relative %: 57 %
Platelet Count: 133 K/uL — ABNORMAL LOW (ref 150–400)
RBC: 3.07 MIL/uL — ABNORMAL LOW (ref 3.87–5.11)
RDW: 16.6 % — ABNORMAL HIGH (ref 11.5–15.5)
WBC Count: 4.2 K/uL (ref 4.0–10.5)
nRBC: 0.7 % — ABNORMAL HIGH (ref 0.0–0.2)

## 2024-05-01 LAB — CMP (CANCER CENTER ONLY)
ALT: 27 U/L (ref 0–44)
AST: 22 U/L (ref 15–41)
Albumin: 3.7 g/dL (ref 3.5–5.0)
Alkaline Phosphatase: 69 U/L (ref 38–126)
Anion gap: 7 (ref 5–15)
BUN: 18 mg/dL (ref 8–23)
CO2: 21 mmol/L — ABNORMAL LOW (ref 22–32)
Calcium: 8 mg/dL — ABNORMAL LOW (ref 8.9–10.3)
Chloride: 110 mmol/L (ref 98–111)
Creatinine: 0.55 mg/dL (ref 0.44–1.00)
GFR, Estimated: >60 mL/min (ref >60)
Glucose, Bld: 87 mg/dL (ref 70–99)
Potassium: 3.6 mmol/L (ref 3.5–5.1)
Sodium: 138 mmol/L (ref 135–145)
Total Bilirubin: 1.2 mg/dL (ref 0.0–1.2)
Total Protein: 6.5 g/dL (ref 6.5–8.1)

## 2024-05-01 MED ORDER — ACETAMINOPHEN 325 MG PO TABS
650.0000 mg | ORAL_TABLET | Freq: Once | ORAL | Status: AC
  Administered 2024-05-01: 650 mg via ORAL
  Filled 2024-05-01: qty 2

## 2024-05-01 MED ORDER — SPIRONOLACTONE 25 MG PO TABS: 25.0000 mg | ORAL_TABLET | Freq: Every day | ORAL | 1 refills | Status: AC | PRN

## 2024-05-01 MED ORDER — DIPHENHYDRAMINE HCL 50 MG/ML IJ SOLN
50.0000 mg | Freq: Once | INTRAMUSCULAR | Status: AC
  Administered 2024-05-01: 50 mg via INTRAVENOUS
  Filled 2024-05-01: qty 1

## 2024-05-01 MED ORDER — MAGNESIUM OXIDE -MG SUPPLEMENT 400 (240 MG) MG PO TABS: 400.0000 mg | ORAL_TABLET | Freq: Two times a day (BID) | ORAL | 1 refills | Status: AC

## 2024-05-01 MED ORDER — SODIUM CHLORIDE 0.9 % IV SOLN
Freq: Once | INTRAVENOUS | Status: AC
  Filled 2024-05-01: qty 250

## 2024-05-01 MED ORDER — SODIUM CHLORIDE 0.9 % IV SOLN
10.0000 mg/kg | Freq: Once | INTRAVENOUS | Status: AC
  Administered 2024-05-01: 700 mg via INTRAVENOUS
  Filled 2024-05-01: qty 25

## 2024-05-01 MED ORDER — FAMOTIDINE IN NACL 20-0.9 MG/50ML-% IV SOLN
20.0000 mg | Freq: Once | INTRAVENOUS | Status: AC
  Administered 2024-05-01: 20 mg via INTRAVENOUS
  Filled 2024-05-01: qty 50

## 2024-05-01 MED ORDER — IRON SUCROSE 20 MG/ML IV SOLN
200.0000 mg | Freq: Once | INTRAVENOUS | Status: AC
  Administered 2024-05-01: 200 mg via INTRAVENOUS
  Filled 2024-05-01: qty 10

## 2024-05-01 MED ORDER — DEXTROSE 5 % IV SOLN
56.0000 mg/m2 | Freq: Once | INTRAVENOUS | Status: AC
  Administered 2024-05-01: 100 mg via INTRAVENOUS
  Filled 2024-05-01: qty 30

## 2024-05-01 MED ORDER — DEXAMETHASONE SODIUM PHOSPHATE 10 MG/ML IJ SOLN
5.0000 mg | Freq: Once | INTRAMUSCULAR | Status: AC
  Administered 2024-05-01: 5 mg via INTRAVENOUS
  Filled 2024-05-01: qty 1

## 2024-05-01 MED ORDER — LEVOTHYROXINE SODIUM 175 MCG PO TABS
175.0000 ug | ORAL_TABLET | Freq: Every day | ORAL | 1 refills | Status: DC
Start: 2024-05-01 — End: 2024-06-12

## 2024-05-01 NOTE — Assessment & Plan Note (Signed)
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   MAY 2024- 1.3. ]  currently on Isa-Dex; d-2 carfil weekly q 28 days. # MARCH 2025- M-protein -0.3 ; kappa lambda light chain ratio-1.8 lambda light chain 63-slowly RISING FEB 2025-chemotherapy was interrupted-until MAY 2025- because of thigh infection needing wound VAC.    # S/p  re-evaluation at [facility] in June 2025- Labs c/w early progression. No evidence of end organ damage-  PET SEP 2025-  No findings of active myeloma.  low-grade activity in a 1.5 cm partially sclerotic lesion in the right distal femoral metaphysis, probably an enchondroma or similar incidental benign lesion. The appearance is not characteristic for myeloma.   # GIVEN PET NEGATIVITY- For now continue with with  Isa-Dex-Carfil- Labs-CBC/chemistries were reviewed with the patient.     # DROP in EF:  MARCH 2024-Echo [[facility]] Left ventricular EF 50 to 55%;  ECHO  04/23/2024 50-55%- stable.    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    # Anemia- JAn 2025- I ron sat- 8%; ferritin- 19 on prn venofer -  stable.    # Electrolyte: Hypokalemia: mild-  continue compliance Kdur TID-; hypocalcemia- 8.0-   stable.    # Iatrogenic hypothyroidism [ Graves/goiter s/p total thyroidectomy;aug,2022 ]-currently on Synthroid  175 mcg/day-- elevated TSH- recommend spacing other medications refilled- stable.   # MAY 2025- thigh infection s/p wound VAC [Dr.Rodenberg]  proceed witjh IVIG prn/monthly.    # Glaucoma BIL- L > R [Dr.King; [facility]] - ? Steroid induced on eye drops. decreased the steroids. Discussed with Dr.Haines, ophthalmology- Glaucoma under control. Stable.   # chronic  Lower back discomfort - Continue tramadol - refilled . Stable.   # Bone lesions /last zometa  on 11/27/2017.  S/p   Dental extraction PET scan evidence of any bone lesions.-  consider Zometa   at next visit.   # Infection prophylaxis: Acyclovir ; asprin- add IVIG infusion [IgG < 400]- plan  IVIG- q 4 w #1 on- 9/17- Stable.   # IV access: mediport- functional  # ACP: Patient has  incurable cancer.  However patient is currently doing well. Patient interested in continue current scope of therapy; and full code.  6 w- given dental extrac- on 1/26. :MM panel; K/l light chain ratio q 4 W  ; NO zometa - dental issues-ok; but hypocalemia  Starting aug 25th- q2w-MD: D-isa-dex;car1&2-car; weekly x 3; week 4-OFF;  q 28 day cycle- pushed off to 6 weeks- 11/04 PS:  # DISPOSITION: # order standing orders for 4 weeks- MM panel/k-l light chains- # weekly port-cbc/cmp # chemo & venofer   # follow up as per IS-  -Dr.B

## 2024-05-01 NOTE — Patient Instructions (Signed)
 [facility] - A DEPT OF [facility]  Discharge Instructions: Thank you for choosing [facility] to provide your oncology and hematology care.  If you have a lab appointment with the Cancer Center, please go directly to the Cancer Center and check in at the registration area.  Wear comfortable clothing and clothing appropriate for easy access to any Portacath or PICC line.   We strive to give you quality time with your provider. You may need to reschedule your appointment if you arrive late (15 or more minutes).  Arriving late affects you and other patients whose appointments are after yours.  Also, if you miss three or more appointments without notifying the office, you may be dismissed from the clinic at the provider's discretion.      For prescription refill requests, have your pharmacy contact our office and allow 72 hours for refills to be completed.    Today you received the following chemotherapy and/or immunotherapy agents SARCALISA and KYPHOLIS      To help prevent nausea and vomiting after your treatment, we encourage you to take your nausea medication as directed.  BELOW ARE SYMPTOMS THAT SHOULD BE REPORTED IMMEDIATELY: *FEVER GREATER THAN 100.4 F (38 C) OR HIGHER *CHILLS OR SWEATING *NAUSEA AND VOMITING THAT IS NOT CONTROLLED WITH YOUR NAUSEA MEDICATION *UNUSUAL SHORTNESS OF BREATH *UNUSUAL BRUISING OR BLEEDING *URINARY PROBLEMS (pain or burning when urinating, or frequent urination) *BOWEL PROBLEMS (unusual diarrhea, constipation, pain near the anus) TENDERNESS IN MOUTH AND THROAT WITH OR WITHOUT PRESENCE OF ULCERS (sore throat, sores in mouth, or a toothache) UNUSUAL RASH, SWELLING OR PAIN  UNUSUAL VAGINAL DISCHARGE OR ITCHING   Items with * indicate a potential emergency and should be followed up as soon as possible or go to the Emergency Department if any problems should occur.  Please show the CHEMOTHERAPY ALERT CARD or  IMMUNOTHERAPY ALERT CARD at check-in to the Emergency Department and triage nurse.  Should you have questions after your visit or need to cancel or reschedule your appointment, please contact [facility] - A DEPT OF [name]  [phone] and follow the prompts.  Office hours are 8:00 a.m. to 4:30 p.m. Monday - Friday. Please note that voicemails left after 4:00 p.m. may not be returned until the following business day.  We are closed weekends and major holidays. You have access to a nurse at all times for urgent questions. Please call the main number to the clinic [phone] and follow the prompts.  For any non-urgent questions, you may also contact your provider using MyChart. We now offer e-Visits for anyone 52 and older to request care online for non-urgent symptoms. For details visit [email or web address].   Also download the MyChart app! Go to the app store, search MyChart, open the app, select [facility], and log in with your MyChart username and password.  Isatuximab  Injection What is this medication? ISATUXIMAB  (EYE sa TUX i mab) treats a type of bone marrow cancer (multiple myeloma). It works by blocking a protein that causes cancer cells to grow and multiply. This helps to slow or stop the spread of cancer cells. This medicine may be used for other purposes; ask your health care provider or pharmacist if you have questions. COMMON BRAND NAME(S): SARCLISA  What should I tell my care team before I take this medication? They need to know if you have any of these conditions: Heart disease Infection, especially  a viral infection, such as chickenpox, cold sores, herpes An unusual or allergic reaction to isatuximab , other medications, foods, dyes, or preservatives Pregnant or trying to get pregnant Breast-feeding How should I use this medication? This medication is injected into a vein. It is given by your care team in a hospital or clinic  setting. Talk to your care team about the use of this medication in children. Special care may be needed. Overdosage: If you think you have taken too much of this medicine contact a poison control center or emergency room at once. NOTE: This medicine is only for you. Do not share this medicine with others. What if I miss a dose? Keep appointments for follow-up doses. It is important not to miss your dose. Call your care team if you are unable to keep an appointment. What may interact with this medication? Interactions have not been studied. This list may not describe all possible interactions. Give your health care provider a list of all the medicines, herbs, non-prescription drugs, or dietary supplements you use. Also tell them if you smoke, drink alcohol, or use illegal drugs. Some items may interact with your medicine. What should I watch for while using this medication? Your condition will be monitored carefully while you are receiving this medication. You may need blood work done while you are taking this medication. This medication may increase your risk of getting an infection. Call your care team for advice if you get a fever, chills, sore throat, or other symptoms of a cold or flu. Do not treat yourself. Try to avoid being around people who are sick. If you have not had the measles or chickenpox vaccines, tell your care team right away if you are around someone with these viruses. Talk to your care team about your risk of cancer. You may be more at risk for certain types of cancers if you take this medication. This medication can affect the results of blood tests to match your blood type. Your care team will do blood tests to match your blood type before you start treatment. Tell your care team that you are being treated with this medication before receiving a blood transfusion. Do not become pregnant while taking this medicine or for at least 5 months after stopping it. Inform your doctor if  you wish to become pregnant or think you might be pregnant. There is a potential for serious side effects to an unborn child. Talk to your care team for more information. Do not breast-feed while taking this medicine. What side effects may I notice from receiving this medication? Side effects that you should report to your care team as soon as possible: Allergic reactions--skin rash, itching, hives, swelling of the face, lips, tongue, or throat Heart failure--shortness of breath, swelling of the ankles, feet, or hands, sudden weight gain, unusual weakness or fatigue Infection--fever, chills, cough, or sore throat Infusion reactions--chest pain, shortness of breath or trouble breathing, feeling faint or lightheaded Side effects that usually do not require medical attention (report these to your care team if they continue or are bothersome): Back pain Diarrhea Increase in blood pressure Trouble sleeping Vomiting This list may not describe all possible side effects. Call your doctor for medical advice about side effects. You may report side effects to FDA at 1-800-FDA-1088. Where should I keep my medication? This medication is given in a hospital or clinic. It will not be stored at home. NOTE: This sheet is a summary. It may not cover all possible information.  If you have questions about this medicine, talk to your doctor, pharmacist, or health care provider.  2024 Elsevier/Gold Standard (2021-06-12 00:00:00)  Carfilzomib  Injection What is this medication? CARFILZOMIB  (kar FILZ oh mib) treats multiple myeloma, a type of bone marrow cancer. It works by blocking a protein that causes cancer cells to grow and multiply. This helps to slow or stop the spread of cancer cells. This medicine may be used for other purposes; ask your health care provider or pharmacist if you have questions. COMMON BRAND NAME(S): KYPROLIS  What should I tell my care team before I take this medication? They need to know if  you have any of these conditions: Heart disease History of blood clots Irregular heartbeat Kidney disease Liver disease Lung or breathing disease An unusual or allergic reaction to carfilzomib , or other medications, foods, dyes, or preservatives If you or your partner are pregnant or trying to get pregnant Breastfeeding How should I use this medication? This medication is injected into a vein. It is given by your care team in a hospital or clinic setting. Talk to your care team about the use of this medication in children. Special care may be needed. Overdosage: If you think you have taken too much of this medicine contact a poison control center or emergency room at once. NOTE: This medicine is only for you. Do not share this medicine with others. What if I miss a dose? Keep appointments for follow-up doses. It is important not to miss your dose. Call your care team if you are unable to keep an appointment. What may interact with this medication? Interactions are not expected. This list may not describe all possible interactions. Give your health care provider a list of all the medicines, herbs, non-prescription drugs, or dietary supplements you use. Also tell them if you smoke, drink alcohol, or use illegal drugs. Some items may interact with your medicine. What should I watch for while using this medication? Your condition will be monitored carefully while you are receiving this medication. You may need blood work while taking this medication. Check with your care team if you have severe diarrhea, nausea, and vomiting, or if you sweat a lot. The loss of too much body fluid may make it dangerous for you to take this medication. This medication may affect your coordination, reaction time, or judgment. Do not drive or operate machinery until you know how this medication affects you. Sit up or stand slowly to reduce the risk of dizzy or fainting spells. Drinking alcohol with this medication can  increase the risk of these side effects. Talk to your care team if you may be pregnant. Serious birth defects can occur if you take this medication during pregnancy and for 6 months after the last dose. You will need a negative pregnancy test before starting this medication. Contraception is recommended while taking this medication and for 6 months after the last dose. Your care team can help you find an option that works for you. If your partner can get pregnant, use a condom during sex while taking this medication and for 3 months after the last dose. Do not breastfeed while taking this medication and for 2 weeks after the last dose. This medication may cause infertility. Talk to your care team if you are concerned about your fertility. What side effects may I notice from receiving this medication? Side effects that you should report to your care team as soon as possible: Allergic reactions--skin rash, itching, hives, swelling of the face,  lips, tongue, or throat Bleeding--bloody or black, tar-like stools, vomiting blood or brown material that looks like coffee grounds, red or dark brown urine, small red or purple spots on skin, unusual bruising or bleeding Blood clot--pain, swelling, or warmth in the leg, shortness of breath, chest pain Dizziness, loss of balance or coordination, confusion or trouble speaking Heart attack--pain or tightness in the chest, shoulders, arms, or jaw, nausea, shortness of breath, cold or clammy skin, feeling faint or lightheaded Heart failure--shortness of breath, swelling of the ankles, feet, or hands, sudden weight gain, unusual weakness or fatigue Heart rhythm changes--fast or irregular heartbeat, dizziness, feeling faint or lightheaded, chest pain, trouble breathing Increase in blood pressure Infection--fever, chills, cough, sore throat, wounds that don't heal, pain or trouble when passing urine, general feeling of discomfort or being unwell Infusion  reactions--chest pain, shortness of breath or trouble breathing, feeling faint or lightheaded Kidney injury--decrease in the amount of urine, swelling of the ankles, hands, or feet Liver injury--right upper belly pain, loss of appetite, nausea, light-colored stool, dark yellow or brown urine, yellowing skin or eyes, unusual weakness or fatigue Lung injury--shortness of breath or trouble breathing, cough, spitting up blood, chest pain, fever Pulmonary hypertension--shortness of breath, chest pain, fast or irregular heartbeat, feeling faint or lightheaded, fatigue, swelling of the ankles or feet Stomach pain, bloody diarrhea, pale skin, unusual weakness or fatigue, decrease in the amount of urine, which may be signs of hemolytic uremic syndrome Sudden and severe headache, confusion, change in vision, seizures, which may be signs of posterior reversible encephalopathy syndrome (PRES) TTP--purple spots on the skin or inside the mouth, pale skin, yellowing skin or eyes, unusual weakness or fatigue, fever, fast or irregular heartbeat, confusion, change in vision, trouble speaking, trouble walking Tumor lysis syndrome (TLS)--nausea, vomiting, diarrhea, decrease in the amount of urine, dark urine, unusual weakness or fatigue, confusion, muscle pain or cramps, fast or irregular heartbeat, joint pain Side effects that usually do not require medical attention (report to your care team if they continue or are bothersome): Diarrhea Fatigue Nausea Trouble sleeping This list may not describe all possible side effects. Call your doctor for medical advice about side effects. You may report side effects to FDA at 1-800-FDA-1088. Where should I keep my medication? This medication is given in a hospital or clinic. It will not be stored at home. NOTE: This sheet is a summary. It may not cover all possible information. If you have questions about this medicine, talk to your doctor, pharmacist, or health care provider.   2024 Elsevier/Gold Standard (2021-12-10 00:00:00)

## 2024-05-01 NOTE — Progress Notes (Signed)
 [facility] OFFICE PROGRESS NOTE  Patient Care Team: [place] HERO, MD as PCP - General (Family Medicine) End, Lonni, MD as PCP - Cardiology (Cardiology) Guinevere File, [name] Rennie Cindy SAUNDERS, MD as Consulting Physician (Oncology) Parris Manna, MD as Consulting Physician (Pulmonary Disease) Damian Therisa HERO, MD as Consulting Physician (Endocrinology)   Cancer Staging  Multiple myeloma not having achieved remission [facility]) Staging form: Plasma Cell Myeloma and Plasma Cell Disorders, AJCC 8th Edition - Clinical: No stage assigned - Unsigned - Clinical: No stage assigned - Unsigned   Oncology History Overview Note  # SEP 2018- MULTIPLE MYELOMA IgALamda [2.5 gm/dl; K/L= 05/8700]; STAGE III [beta 2 microglobulin=5.5] [presented with acute renal failure; anemia; NO hypercalcemia; Skeletal survey-Normal]; BMBx- 45% plasma cells; FISH-POSITIVE 11:14 translocation.[STANDARD-high RISK]/cyto-Normal; SEP 2018- PET- L3 posterior element lesion.   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 No morphologic support for residual myeloma disease. Negative for minimal residual disease by MM-MRD flow cytometry; HOLD REVLIMID  [leg swelling]; AUG 2021-PET scan negative for myeloma; continue to hold Revlimid   # MARCH 2021-diastolic congestive heart failure [Dr.End]  # BMBx- OCT 2021- BMBx- 5% plasmacytosis-however this appears to be more polyclonal  rather than monoclonal-not explain patient worsening anemia.   # OCT 2022- 18th- Dara- Rev-Dex; MARCH, 2024- PROGRESSION bone marrow biopsy 70% plasma cells; PET scan no bone lesions-however conus medullaris uptake-MRI lumbar spine-   # MAY 2024- DISCONTINUE carfilzomib -dexamethasone - Pomalyst ; [rising M-protein; MAY 2024- 1.3. ]   # start 12/27/2022 -Isa-Carfil-Dex;  [decrease dexamethasone  20 weekly- starting 05/04/23- sec to glaucoma].   # OCT 24th, 2024- Bone marrow Biopsy: The findings are consistent with  focal minimal residual lambda restricted plasma cell neoplasm- HOLD treatment sec to glaucoma; and drop in EF AUG 2024- 45-50% [March 2024- 50-55%]  # MARCH 16th, 2025- re-started Isa-Carfil-Dex;  #November 2021-hyperthyroidism/goiter- [D.Bennett/Dr.Solum]-methimazole . S/p Thyroidectomy [Dr.Kim; [facility]-AUG 2022]  --------------------------  # 12/12- RIGHT JUGULAR DVT-x 71m on xarelto ; finished April 2020; September 2020-EGD/dysphagia; Dr. Therisa  # Acute renal failure [Dr.Singh; Proteinuria 1.5gm/day ]; acyclovir Mickeal ------------------------------------------------------------------------------------------------------------   DIAGNOSIS: [ ]  MULTIPLE MYELOMA  STAGE: III/HIGH RISK ;GOALS: CONTROL      Multiple myeloma not having achieved remission (HCC)  11/21/2017 - 04/28/2018 Chemotherapy   Patient is on Treatment Plan : MYELOMA SALVAGE Cyclophosphamide  / Carfilzomib  / Dexamethasone  (CCd) q28d     05/12/2021 - 02/25/2022 Chemotherapy   Patient is on Treatment Plan : MYELOMA RELAPSED REFRACTORY Daratumumab  SQ + Lenalidomide  + Dexamethasone  (DaraRd) q28d     05/12/2021 - 07/23/2022 Chemotherapy   Patient is on Treatment Plan : MYELOMA RELAPSED REFRACTORY Daratumumab  SQ + Lenalidomide  + Dexamethasone  (DaraRd) q28d     11/02/2022 - 11/16/2022 Chemotherapy   Patient is on Treatment Plan : MYELOMA RELAPSED/ REFRACTORY Carfilzomib  D1,8,15 (20/27) + Pomalidomide  + Dexamethasone  (KPd) q28d      12/27/2022 -  Chemotherapy   Patient is on Treatment Plan : MYELOMA Carfilzomib  + Dexamethasone  + Isatuximab  q28d      INTERVAL HISTORY: Ambulating  independently.  Alone.  Doyne  E Hollon 73 y.o.  female pleasant patient above history of relapsed multiple myeloma--currently re-started back after 3 months break [JULY 28th, 2025]  Isa- Carfil-Dex  is here for a follow up.   Notes to have cold sensitivity in hand  but not in feet- no tingling or numbness. Improved with heating heating gloves.   No further signs of of infection.  No fevers or chills.. Denies any worsening pain. Patient taking tramadol  as needed for upper and leg cramps.   No difficulty breathing. No worsening leg swelling.    Review of Systems  Constitutional:  Positive for malaise/fatigue. Negative for chills, diaphoresis and fever.  HENT:  Negative for nosebleeds and sore throat.   Eyes:  Negative for double vision.  Respiratory:  Negative for hemoptysis.   Cardiovascular:  Negative for chest pain, palpitations and orthopnea.  Gastrointestinal:  Negative for abdominal pain, blood in stool, constipation, heartburn and melena.  Genitourinary:  Negative for dysuria, frequency and urgency.  Musculoskeletal:  Positive for back pain.  Skin: Negative.  Negative for itching and rash.  Neurological:  Negative for dizziness, tingling, focal weakness, weakness and headaches.  Endo/Heme/Allergies:  Does not bruise/bleed easily.  Psychiatric/Behavioral:  Negative for depression. The patient is not nervous/anxious.     PAST MEDICAL HISTORY :  Past Medical History:  Diagnosis Date   (HFpEF) heart failure with preserved ejection fraction (HCC)    a. 06/2020 Echo: EF 60-65%, no rwma, nl RV size/fxn. Triv MR. Triv TR/AI. Mod elev PASP.   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   Open wound of left thigh 12/15/2023   Pneumonia    Red blood cell antibody positive, compatible PRBC difficult to obtain    S/P autologous bone marrow transplantation (HCC)    Sepsis (HCC) 11/25/2023    PAST SURGICAL HISTORY :   Past Surgical History:  Procedure Laterality Date   ANTERIOR VITRECTOMY Left 10/07/2021   Procedure: ANTERIOR VITRECTOMY;  Surgeon: Mittie Gaskin, MD;  Location: [facility];  Service: Ophthalmology;  Laterality: Left;   CATARACT EXTRACTION W/PHACO Left 10/07/2021   Procedure: CATARACT EXTRACTION PHACO AND INTRAOCULAR LENS PLACEMENT (IOC) LEFT VISION BLUE;  Surgeon: Mittie Gaskin, MD;  Location: [facility];  Service: Ophthalmology;  Laterality: Left;  kit for manual incision 14.56 01:21.4   CATARACT EXTRACTION W/PHACO Right 10/21/2021   Procedure: CATARACT EXTRACTION PHACO AND INTRAOCULAR LENS PLACEMENT (IOC) RIGHT 11.12 01:48.1;  Surgeon: Mittie Gaskin, MD;  Location: [facility];  Service: Ophthalmology;  Laterality: Right;   ESOPHAGOGASTRODUODENOSCOPY (EGD) WITH PROPOFOL  N/A 03/30/2019   Procedure: ESOPHAGOGASTRODUODENOSCOPY (EGD) WITH PROPOFOL ;  Surgeon: Therisa Bi, MD;  Location: [facility] ENDOSCOPY;  Service: Gastroenterology;  Laterality: N/A;   INCISION AND DRAINAGE ABSCESS Left 12/01/2023   Procedure: INCISION AND DRAINAGE, ABSCESS;  Surgeon: Lane Shope, MD;  Location: [facility] ORS;  Service: General;  Laterality: Left;   IR BONE MARROW BIOPSY & ASPIRATION  05/25/2023   IR FLUORO GUIDE PORT INSERTION RIGHT  12/16/2017   IR IMAGING GUIDED PORT INSERTION  06/15/2021   KNEE ARTHROSCOPY Left 09/03/2022   Procedure: ARTHROSCOPY KNEE DEBRIDEMENT;  Surgeon: Leora Lynwood SAUNDERS, MD;  Location: [facility] ORS;  Service: Orthopedics;  Laterality: Left;   THYROIDECTOMY  N/A     FAMILY HISTORY :   Family  History  Problem Relation Age of Onset   Pneumonia Mother    Seizures Father     SOCIAL HISTORY:   Social History   Tobacco Use   Smoking status: Former    Current packs/day: 0.00    Types: Cigarettes    Quit date: 03/18/2021    Years since quitting: 3.1   Smokeless tobacco: Never   Tobacco comments:    2 cigarettes QOD  Vaping Use   Vaping status: Never Used  Substance Use Topics   Alcohol use: No   Drug use: No    ALLERGIES:  has no known allergies.  MEDICATIONS:  Current Outpatient Medications  Medication Sig Dispense Refill   acyclovir  (ZOVIRAX ) 400 MG tablet Take 1 tablet (400 mg total) by mouth 2 (two) times daily. 120 tablet 2   albuterol  (VENTOLIN  HFA) 108 (90 Base) MCG/ACT inhaler Inhale 2 puffs into the lungs every 6 (six) hours as needed for wheezing or shortness of breath. 8 g 3   aspirin  EC 81 MG EC tablet Take 1 tablet (81 mg total) by mouth daily. 90 tablet 3   Brinzolamide -Brimonidine  (SIMBRINZA) 1-0.2 % SUSP Apply 1 drop to eye in the morning and at bedtime. I drop both eyes twice a day     Calcium  Carb-Cholecalciferol 600-10 MG-MCG TABS Take 1 tablet by mouth daily.     calcium  carbonate (CALCIUM  600 HIGH POTENCY) 600 MG TABS tablet Take 2 tablets (1,200 mg total) by mouth 2 (two) times daily. 120 tablet 2   furosemide  (LASIX ) 40 MG tablet Take 1 tablet (40 mg total) by mouth daily as needed for edema or fluid.     lidocaine -prilocaine  (EMLA ) cream Apply 1 application topically as needed. Apply to port and cover with saran wrap 1-2 hours prior to port access 30 g 1   montelukast  (SINGULAIR ) 10 MG tablet Take 1 tablet (10 mg total) by mouth at bedtime. 30 tablet 3   NIFEdipine (ADALAT CC) 30 MG 24 hr tablet Take 30 mg by mouth daily.     pantoprazole  (PROTONIX ) 40 MG tablet Take 1 tablet (40 mg total) by mouth daily. 90 tablet 0   polyethylene glycol (MIRALAX  / GLYCOLAX ) packet Take 17 g by mouth daily as needed for mild constipation. 14 each 0   traMADol   (ULTRAM ) 50 MG tablet Take 1 tablet (50 mg total) by mouth every 12 (twelve) hours as needed. 60 tablet 0   Vitamin D , Ergocalciferol , (DRISDOL ) 1.25 MG (50000 UNIT) CAPS capsule Take 1 capsule by mouth once a week 12 capsule 0   levothyroxine  (SYNTHROID ) 175 MCG tablet Take 1 tablet (175 mcg total) by mouth daily. For thyroid  90 tablet 1   magnesium  oxide (MAG-OX) 400 (240 Mg) MG tablet Take 1 tablet (400 mg total) by mouth 2 (two) times daily. 90 tablet 1   spironolactone  (ALDACTONE ) 25 MG tablet Take 1 tablet (25 mg total) by mouth daily as needed (edema). 90 tablet 1   No current facility-administered medications for this visit.   Facility-Administered Medications Ordered in Other Visits  Medication Dose Route Frequency Provider Last Rate Last Admin   0.9 %  sodium chloride  infusion   Intravenous Once Terren Jandreau R, MD       carfilzomib  (KYPROLIS ) 100 mg in dextrose  5 % 100 mL chemo infusion  56 mg/m2 (Treatment Plan Recorded) Intravenous Once Nikeisha Klutz R, MD       famotidine  (PEPCID ) IVPB  20 mg premix  20 mg Intravenous Once Shalay Carder R, MD       iron  sucrose (VENOFER ) injection 200 mg  200 mg Intravenous Once Asier Desroches R, MD       isatuximab -irfc (SARCLISA ) 700 mg in sodium chloride  0.9 % 215 mL (2.8 mg/mL) chemo infusion  10 mg/kg (Treatment Plan Recorded) Intravenous Once Zimal Weisensel R, MD        PHYSICAL EXAMINATION:   BP 120/69 (BP Location: Left Arm, Patient Position: Sitting, Cuff Size: Normal)   Pulse 63   Temp 98 F (36.7 C) (Tympanic)   Resp 20   Ht 5' 2 (1.575 m)   Wt 154 lb 12.8 oz (70.2 kg)   SpO2 100%   BMI 28.31 kg/m   Filed Weights   05/01/24 0836  Weight: 154 lb 12.8 oz (70.2 kg)         Mild leg swelling.   Physical Exam HENT:     Head: Normocephalic and atraumatic.  Eyes:     Pupils: Pupils are equal, round, and reactive to light.  Cardiovascular:     Rate and Rhythm: Normal rate and regular  rhythm.  Pulmonary:     Effort: Pulmonary effort is normal. No respiratory distress.     Breath sounds: Normal breath sounds. No wheezing.  Abdominal:     General: Bowel sounds are normal. There is no distension.     Palpations: Abdomen is soft. There is no mass.     Tenderness: There is no abdominal tenderness. There is no guarding or rebound.  Musculoskeletal:        General: No tenderness. Normal range of motion.     Cervical back: Normal range of motion and neck supple.  Skin:    General: Skin is warm.  Neurological:     Mental Status: She is alert and oriented to person, place, and time.  Psychiatric:        Mood and Affect: Affect normal.    LABORATORY DATA:  I have reviewed the data as listed    Component Value Date/Time   NA 138 05/01/2024 0833   NA 141 01/25/2020 1530   K 3.6 05/01/2024 0833   CL 110 05/01/2024 0833   CO2 21 (L) 05/01/2024 0833   GLUCOSE 87 05/01/2024 0833   BUN 18 05/01/2024 0833   BUN 19 01/25/2020 1530   CREATININE 0.55 05/01/2024 0833   CALCIUM  8.0 (L) 05/01/2024 0833   PROT 6.5 05/01/2024 0833   ALBUMIN 3.7 05/01/2024 0833   AST 22 05/01/2024 0833   ALT 27 05/01/2024 0833   ALKPHOS 69 05/01/2024 0833   BILITOT 1.2 05/01/2024 0833   GFRNONAA >60 05/01/2024 0833   GFRAA NOT CALCULATED 04/17/2020 1352    No results found for: SPEP, UPEP  Lab Results  Component Value Date   WBC 4.2 05/01/2024   NEUTROABS PENDING 05/01/2024   HGB 9.6 (L) 05/01/2024   HCT 29.6 (L) 05/01/2024   MCV 96.4 05/01/2024   PLT 133 (L) 05/01/2024      Chemistry      Component Value Date/Time   NA 138 05/01/2024 0833   NA 141 01/25/2020 1530   K 3.6 05/01/2024 0833   CL 110 05/01/2024 0833   CO2 21 (L) 05/01/2024 0833   BUN 18 05/01/2024 0833   BUN 19 01/25/2020 1530   CREATININE 0.55 05/01/2024 0833      Component Value Date/Time   CALCIUM  8.0 (L) 05/01/2024 0833   ALKPHOS 69 05/01/2024  0833   AST 22 05/01/2024 0833   ALT 27 05/01/2024 0833    BILITOT 1.2 05/01/2024 0833      (H): Data is abnormally high   Latest Reference Range & Units Most Recent 12/24/20 14:01 04/01/21 14:34 04/28/21 09:17 06/11/21 09:42 07/03/21 10:32 08/20/21 10:06  M Protein SerPl Elph-Mcnc Not Observed g/dL 1.1 (H) (C) 8/73/76 89:93 Not Observed (C) 0.6 (H) (C) 0.7 (H) (C) 0.5 (H) (C) 0.7 (H) (C) 1.1 (H) (C)    Latest Reference Range & Units Most Recent 09/17/21 08:47 10/15/21 08:25 11/12/21 08:55  Kappa free light chain 3.3 - 19.4 mg/L 5.0 11/12/21 08:55 7.2 7.2 5.0  Lambda free light chains 5.7 - 26.3 mg/L 223.6 (H) 11/12/21 08:55 215.3 (H) 340.2 (H) 223.6 (H)  Kappa, lambda light chain ratio 0.26 - 1.65  0.02 (L) 11/12/21 08:55 0.03 (L) 0.02 (L) 0.02 (L)  (H): Data is abnormally high (L): Data is abnormally low  RADIOGRAPHIC STUDIES: I have personally reviewed the radiological images as listed and agreed with the findings in the report. No results found.    ASSESSMENT & PLAN:  Multiple myeloma not having achieved remission (HCC) # RECURRENT Multiple myeloma stage III [high-risk cytogenetics-status post KRD-VGPR ; status post autologous stem cell transplant on 06/15/18.   MAY 2024- 1.3. ]  currently on Isa-Dex; d-2 carfil weekly q 28 days. # MARCH 2025- M-protein -0.3 ; kappa lambda light chain ratio-1.8 lambda light chain 63-slowly RISING FEB 2025-chemotherapy was interrupted-until MAY 2025- because of thigh infection needing wound VAC.    # S/p  re-evaluation at [facility] in June 2025- Labs c/w early progression. No evidence of end organ damage-  PET SEP 2025-  No findings of active myeloma.  low-grade activity in a 1.5 cm partially sclerotic lesion in the right distal femoral metaphysis, probably an enchondroma or similar incidental benign lesion. The appearance is not characteristic for myeloma.   # GIVEN PET NEGATIVITY- For now continue with with  Isa-Dex-Carfil- Labs-CBC/chemistries were reviewed with the patient.     # DROP in EF:  MARCH 2024-Echo  [[facility]] Left ventricular EF 50 to 55%;  ECHO  04/23/2024 50-55%- stable.    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    # Anemia- JAn 2025- I ron sat- 8%; ferritin- 19 on prn venofer -  stable.    # Electrolyte: Hypokalemia: mild-  continue compliance Kdur TID-; hypocalcemia- 8.0-   stable.    # Iatrogenic hypothyroidism [ Graves/goiter s/p total thyroidectomy;aug,2022 ]-currently on Synthroid  175 mcg/day-- elevated TSH- recommend spacing other medications refilled- stable.   # MAY 2025- thigh infection s/p wound VAC [Dr.Rodenberg]  proceed witjh IVIG prn/monthly.    # Glaucoma BIL- L > R [Dr.King; [place] [facility]] - ? Steroid induced on eye drops. decreased the steroids. Discussed with Dr.Haines, ophthalmology- Glaucoma under control. Stable.   # chronic  Lower back discomfort - Continue tramadol - refilled . Stable.   # Bone lesions /last zometa  on 11/27/2017.  S/p   Dental extraction PET scan evidence of any bone lesions.- consider Zometa   at next visit.   # Infection prophylaxis: Acyclovir ; asprin- add IVIG infusion [IgG < 400]- plan  IVIG- q 4 w #1 on- 9/17- Stable.   # IV access: mediport- functional  # ACP: Patient has  incurable cancer.  However patient is currently doing well. Patient interested in continue current scope of therapy; and full code.  6 w- given dental extrac- on 1/26. :MM  panel; K/l light chain ratio q 4 W  ; NO zometa - dental issues-ok; but hypocalemia  Starting aug 25th- q2w-MD: D-isa-dex;car1&2-car; weekly x 3; week 4-OFF;  q 28 day cycle- pushed off to 6 weeks- 11/04 PS:  # DISPOSITION: # order standing orders for 4 weeks- MM panel/k-l light chains- # weekly port-cbc/cmp # chemo & venofer   # follow up as per IS-  -Dr.B   No orders of the defined types were placed in this encounter.  All questions were answered. The patient knows to call the clinic with any problems, questions or concerns.      Cindy JONELLE Joe, MD 05/01/2024 9:45 AM End

## 2024-05-01 NOTE — Progress Notes (Signed)
 Needs refills of mag, levothyroxin and spironolactone , pended.

## 2024-05-02 ENCOUNTER — Inpatient Hospital Stay

## 2024-05-02 VITALS — BP 128/66 | HR 75 | Temp 97.2°F | Resp 18

## 2024-05-02 DIAGNOSIS — C9000 Multiple myeloma not having achieved remission: Secondary | ICD-10-CM

## 2024-05-02 DIAGNOSIS — Z5112 Encounter for antineoplastic immunotherapy: Secondary | ICD-10-CM | POA: Diagnosis not present

## 2024-05-02 MED ORDER — SODIUM CHLORIDE 0.9 % IV SOLN
Freq: Once | INTRAVENOUS | Status: AC
  Filled 2024-05-02: qty 250

## 2024-05-02 MED ORDER — DEXAMETHASONE SODIUM PHOSPHATE 10 MG/ML IJ SOLN
5.0000 mg | Freq: Once | INTRAMUSCULAR | Status: AC
  Administered 2024-05-02: 5 mg via INTRAVENOUS
  Filled 2024-05-02: qty 1

## 2024-05-02 MED ORDER — DEXTROSE 5 % IV SOLN
56.0000 mg/m2 | Freq: Once | INTRAVENOUS | Status: AC
  Administered 2024-05-02: 100 mg via INTRAVENOUS
  Filled 2024-05-02: qty 30

## 2024-05-07 ENCOUNTER — Inpatient Hospital Stay

## 2024-05-07 VITALS — BP 130/67 | HR 62 | Temp 98.2°F | Resp 18 | Ht 62.0 in | Wt 153.9 lb

## 2024-05-07 DIAGNOSIS — Z5112 Encounter for antineoplastic immunotherapy: Secondary | ICD-10-CM | POA: Diagnosis not present

## 2024-05-07 DIAGNOSIS — C9000 Multiple myeloma not having achieved remission: Secondary | ICD-10-CM

## 2024-05-07 MED ORDER — DEXTROSE 5 % IV SOLN
Freq: Once | INTRAVENOUS | Status: AC
  Filled 2024-05-07: qty 250

## 2024-05-07 MED ORDER — DIPHENHYDRAMINE HCL 25 MG PO CAPS
25.0000 mg | ORAL_CAPSULE | Freq: Once | ORAL | Status: AC
  Administered 2024-05-07: 25 mg via ORAL
  Filled 2024-05-07: qty 1

## 2024-05-07 MED ORDER — ACETAMINOPHEN 325 MG PO TABS
650.0000 mg | ORAL_TABLET | Freq: Once | ORAL | Status: AC
  Administered 2024-05-07: 650 mg via ORAL
  Filled 2024-05-07: qty 2

## 2024-05-07 MED ORDER — IMMUNE GLOBULIN (HUMAN) 20 GM/200ML IV SOLN
400.0000 mg/kg | Freq: Once | INTRAVENOUS | Status: AC
  Administered 2024-05-07: 25 g via INTRAVENOUS
  Filled 2024-05-07: qty 50

## 2024-05-07 NOTE — Patient Instructions (Signed)
 [facility] - A DEPT OF [facility]  Discharge Instructions: Thank you for choosing [facility] to provide your oncology and hematology care.  If you have a lab appointment with the Cancer Center, please go directly to the Cancer Center and check in at the registration area.  Wear comfortable clothing and clothing appropriate for easy access to any Portacath or PICC line.   We strive to give you quality time with your provider. You may need to reschedule your appointment if you arrive late (15 or more minutes).  Arriving late affects you and other patients whose appointments are after yours.  Also, if you miss three or more appointments without notifying the office, you may be dismissed from the clinic at the provider's discretion.      For prescription refill requests, have your pharmacy contact our office and allow 72 hours for refills to be completed.    Today you received the following chemotherapy and/or immunotherapy agents IVIG      To help prevent nausea and vomiting after your treatment, we encourage you to take your nausea medication as directed.  BELOW ARE SYMPTOMS THAT SHOULD BE REPORTED IMMEDIATELY: *FEVER GREATER THAN 100.4 F (38 C) OR HIGHER *CHILLS OR SWEATING *NAUSEA AND VOMITING THAT IS NOT CONTROLLED WITH YOUR NAUSEA MEDICATION *UNUSUAL SHORTNESS OF BREATH *UNUSUAL BRUISING OR BLEEDING *URINARY PROBLEMS (pain or burning when urinating, or frequent urination) *BOWEL PROBLEMS (unusual diarrhea, constipation, pain near the anus) TENDERNESS IN MOUTH AND THROAT WITH OR WITHOUT PRESENCE OF ULCERS (sore throat, sores in mouth, or a toothache) UNUSUAL RASH, SWELLING OR PAIN  UNUSUAL VAGINAL DISCHARGE OR ITCHING   Items with * indicate a potential emergency and should be followed up as soon as possible or go to the Emergency Department if any problems should occur.  Please show the CHEMOTHERAPY ALERT CARD or IMMUNOTHERAPY ALERT  CARD at check-in to the Emergency Department and triage nurse.  Should you have questions after your visit or need to cancel or reschedule your appointment, please contact [facility] - A DEPT OF [facility]  [phone] and follow the prompts.  Office hours are 8:00 a.m. to 4:30 p.m. Monday - Friday. Please note that voicemails left after 4:00 p.m. may not be returned until the following business day.  We are closed weekends and major holidays. You have access to a nurse at all times for urgent questions. Please call the main number to the clinic [phone] and follow the prompts.  For any non-urgent questions, you may also contact your provider using MyChart. We now offer e-Visits for anyone 64 and older to request care online for non-urgent symptoms. For details visit [email or web address].   Also download the MyChart app! Go to the app store, search MyChart, open the app, select [facility], and log in with your MyChart username and password.   Immune Globulin  Injection What is this medication? IMMUNE GLOBULIN  (im MUNE GLOB yoo lin) treats many immune system conditions. It works by Designer, multimedia extra antibodies. Antibodies are proteins made by the immune system that help protect the body. This medicine may be used for other purposes; ask your health care provider or pharmacist if you have questions. COMMON BRAND NAME(S): ASCENIV, Baygam, BIVIGAM, Carimune, Carimune NF, cutaquig, Cuvitru, Flebogamma, Flebogamma DIF, GamaSTAN, GamaSTAN S/D, Gamimune N, Gammagard, Gammagard S/D, Gammaked, Gammaplex, Gammar-P IV, Gamunex, Gamunex-C, Hizentra, Iveegam, Iveegam EN, Octagam, Panglobulin, Panglobulin NF, panzyga, Polygam  S/D, Privigen , Sandoglobulin, Venoglobulin-S, Vigam, Vivaglobulin, Xembify What should I tell my care team before I take this medication? They need to know if you have any of these conditions: Blood clotting disorder Condition where you have  excess fluid in your body, such as heart failure or edema Dehydration Diabetes Have had blood clots Heart disease Immune system conditions Kidney disease Low levels of IgA Recent or upcoming vaccine An unusual or allergic reaction to immune globulin , other medications, foods, dyes, or preservatives Pregnant or trying to get pregnant Breastfeeding How should I use this medication? This medication is infused into a vein or under the skin. It may also be injected into a muscle. It is usually given by your care team in a hospital or clinic setting. It may also be given at home. If you get this medication at home, you will be taught how to prepare and give it. Take it as directed on the prescription label. Keep taking it unless your care team tells you to stop. It is important that you put your used needles and syringes in a special sharps container. Do not put them in a trash can. If you do not have a sharps container, call your pharmacist or care team to get one. Talk to your care team about the use of this medication in children. While it may be given to children for selected conditions, precautions do apply. Overdosage: If you think you have taken too much of this medicine contact a poison control center or emergency room at once. NOTE: This medicine is only for you. Do not share this medicine with others. What if I miss a dose? If you get this medication at the hospital or clinic: It is important not to miss your dose. Call your care team if you are unable to keep an appointment. If you give yourself this medication at home: If you miss a dose, take it as soon as you can. Then continue your normal schedule. If it is almost time for your next dose, take only that dose. Do not take double or extra doses. Call your care team with questions. What may interact with this medication? Live virus vaccines This list may not describe all possible interactions. Give your health care provider a list of  all the medicines, herbs, non-prescription drugs, or dietary supplements you use. Also tell them if you smoke, drink alcohol , or use illegal drugs. Some items may interact with your medicine. What should I watch for while using this medication? Your condition will be monitored carefully while you are receiving this medication. Tell your care team if your symptoms do not start to get better or if they get worse. You may need blood work done while you are taking this medication. This medication increases the risk of blood clots. People with heart, blood vessel, or blood clotting conditions are more likely to develop a blood clot. Other risk factors include advanced age, estrogen use, tobacco use, lack of movement, and being overweight. This medication can decrease the response to a vaccine. If you need to get vaccinated, tell your care team if you have received this medication within the last year. Extra booster doses may be needed. Talk to your care team to see if a different vaccination schedule is needed. This medication is made from donated human blood. There is a small risk it may contain bacteria or viruses, such as hepatitis or HIV. All products are processed to kill most bacteria and viruses. Talk to your care team if  you have questions about the risk of infection. If you have diabetes, talk to your care team about which device you should use to check your blood sugar. This medication may cause some devices to report falsely high blood sugar levels. This may cause you to react by not treating a low blood sugar level or by giving an insulin  dose that was not needed. This can cause severe low blood sugar levels. What side effects may I notice from receiving this medication? Side effects that you should report to your care team as soon as possible: Allergic reactions--skin rash, itching, hives, swelling of the face, lips, tongue, or throat Blood clot--pain, swelling, or warmth in the leg, shortness of  breath, chest pain Fever, neck pain or stiffness, sensitivity to light, headache, nausea, vomiting, confusion, which may be signs of meningitis Hemolytic anemia--unusual weakness or fatigue, dizziness, headache, trouble breathing, dark urine, yellowing skin or eyes Kidney injury--decrease in the amount of urine, swelling of the ankles, hands, or feet Low sodium level--muscle weakness, fatigue, dizziness, headache, confusion Shortness of breath or trouble breathing, cough, unusual weakness or fatigue, blue skin or lips Side effects that usually do not require medical attention (report these to your care team if they continue or are bothersome): Chills Diarrhea Fever Headache Nausea This list may not describe all possible side effects. Call your doctor for medical advice about side effects. You may report side effects to FDA at 1-800-FDA-1088. Where should I keep my medication? Keep out of the reach of children and pets. You will be instructed on how to store this medication. Get rid of any unused medication after the expiration date. To get rid of medications that are no longer needed or have expired: Take the medication to a medication take-back program. Check with your pharmacy or law enforcement to find a location. If you cannot return the medication, ask your pharmacist or care team how to get rid of this medication safely. NOTE: This sheet is a summary. It may not cover all possible information. If you have questions about this medicine, talk to your doctor, pharmacist, or health care provider.  2025 Elsevier/Gold Standard (2023-09-26 00:00:00)

## 2024-05-11 ENCOUNTER — Other Ambulatory Visit: Payer: Self-pay

## 2024-05-11 ENCOUNTER — Emergency Department
Admission: EM | Admit: 2024-05-11 | Discharge: 2024-05-11 | Disposition: A | Discharge status: Home / Self Care | Attending: Emergency Medicine | Admitting: Emergency Medicine

## 2024-05-11 DIAGNOSIS — L03311 Cellulitis of abdominal wall: Secondary | ICD-10-CM | POA: Diagnosis present

## 2024-05-11 DIAGNOSIS — L0291 Cutaneous abscess, unspecified: Secondary | ICD-10-CM

## 2024-05-11 DIAGNOSIS — L02211 Cutaneous abscess of abdominal wall: Secondary | ICD-10-CM | POA: Diagnosis not present

## 2024-05-11 DIAGNOSIS — Z8579 Personal history of other malignant neoplasms of lymphoid, hematopoietic and related tissues: Secondary | ICD-10-CM | POA: Insufficient documentation

## 2024-05-11 LAB — CBC
HCT: 34.6 % — ABNORMAL LOW (ref 36.0–46.0)
Hemoglobin: 11 g/dL — ABNORMAL LOW (ref 12.0–15.0)
MCH: 32.3 pg (ref 26.0–34.0)
MCHC: 31.8 g/dL (ref 30.0–36.0)
MCV: 101.5 fL — ABNORMAL HIGH (ref 80.0–100.0)
Platelets: 207 K/uL (ref 150–400)
RBC: 3.41 MIL/uL — ABNORMAL LOW (ref 3.87–5.11)
RDW: 16.2 % — ABNORMAL HIGH (ref 11.5–15.5)
WBC: 4.5 K/uL (ref 4.0–10.5)
nRBC: 0 % (ref 0.0–0.2)

## 2024-05-11 LAB — LACTIC ACID, PLASMA: Lactic Acid, Venous: 1.7 mmol/L (ref 0.5–1.9)

## 2024-05-11 LAB — BASIC METABOLIC PANEL WITH GFR
Anion gap: 13 (ref 5–15)
BUN: 15 mg/dL (ref 8–23)
CO2: 22 mmol/L (ref 22–32)
Calcium: 9 mg/dL (ref 8.9–10.3)
Chloride: 105 mmol/L (ref 98–111)
Creatinine, Ser: 0.68 mg/dL (ref 0.44–1.00)
GFR, Estimated: >60 mL/min (ref >60)
Glucose, Bld: 90 mg/dL (ref 70–99)
Potassium: 4.1 mmol/L (ref 3.5–5.1)
Sodium: 140 mmol/L (ref 135–145)

## 2024-05-11 MED ORDER — DOXYCYCLINE HYCLATE 100 MG PO TABS
100.0000 mg | ORAL_TABLET | Freq: Once | ORAL | Status: AC
  Administered 2024-05-11: 100 mg via ORAL
  Filled 2024-05-11: qty 1

## 2024-05-11 MED ORDER — DOXYCYCLINE HYCLATE 100 MG PO TABS: 100.0000 mg | ORAL_TABLET | Freq: Two times a day (BID) | ORAL | 0 refills | Status: AC

## 2024-05-11 MED ORDER — CEFUROXIME AXETIL 250 MG PO TABS: 250.0000 mg | ORAL_TABLET | Freq: Two times a day (BID) | ORAL | 0 refills | Status: AC

## 2024-05-11 MED ORDER — CEFUROXIME AXETIL 250 MG PO TABS
250.0000 mg | ORAL_TABLET | Freq: Once | ORAL | Status: AC
  Administered 2024-05-11: 250 mg via ORAL
  Filled 2024-05-11: qty 1

## 2024-05-11 NOTE — Discharge Instructions (Signed)
 Please squeeze at least once per day to try to get the pus out, you or your daughter can do this but it needs to continue draining.  Follow-up with Dr. Lane in the clinic

## 2024-05-11 NOTE — ED Provider Notes (Signed)
 [facility] Provider Note    Event Date/Time   First MD Initiated Contact with Patient 05/11/24 1406     (approximate)   History   Abscess   HPI  Brandi [name] is a 73 y.o. female who presents to the ED for evaluation of Abscess   I reviewed an oncology clinic visit from last week.  History of multiple myeloma. Review of surgery clinic visit from July, 5 months ago had necrotizing cellulitis to her left thigh requiring surgical intervention, wound VAC that has since healed.  Patient presents with concerns for an abscess on her abdominal wall.  Reports it started as a blackhead from her skin and just seem to get worse.  No systemic symptoms, just localized discomfort   Physical Exam   Triage Vital Signs: ED Triage Vitals  Encounter Vitals Group     BP 05/11/24 1324 (!) 154/72     Girls Systolic BP Percentile --      Girls Diastolic BP Percentile --      Boys Systolic BP Percentile --      Boys Diastolic BP Percentile --      Pulse Rate 05/11/24 1324 83     Resp 05/11/24 1324 18     Temp 05/11/24 1324 98.6 F (37 C)     Temp src --      SpO2 05/11/24 1324 100 %     Weight --      Height --      Head Circumference --      Peak Flow --      Pain Score 05/11/24 1323 3     Pain Loc --      Pain Education --      Exclude from Growth Chart --     Most recent vital signs: Vitals:   05/11/24 1324  BP: (!) 154/72  Pulse: 83  Resp: 18  Temp: 98.6 F (37 C)  SpO2: 100%    General: Awake, no distress.  Generally well-appearing and pleasant CV:  Good peripheral perfusion.  Resp:  Normal effort.  Abd:  No distention.  MSK:  No deformity noted.  Neuro:  No focal deficits appreciated. Other:  As pictured below, to the 10 o'clock position a few centimeters from her umbilicus is an area of draining abscess and surrounding cellulitis.  I am able to express purulent material and it is already draining     ED Results / Procedures /  Treatments   Labs (all labs ordered are listed, but only abnormal results are displayed) Labs Reviewed  CBC - Abnormal; Notable for the following components:      Result Value   RBC 3.41 (*)    Hemoglobin 11.0 (*)    HCT 34.6 (*)    MCV 101.5 (*)    RDW 16.2 (*)    All other components within normal limits  BASIC METABOLIC PANEL WITH GFR  LACTIC ACID, PLASMA    EKG   RADIOLOGY   Official radiology report(s): No results found.  PROCEDURES and INTERVENTIONS:  Procedures  Medications  cefUROXime (CEFTIN) tablet 250 mg (has no administration in time range)  doxycycline (VIBRA-TABS) tablet 100 mg (has no administration in time range)     IMPRESSION / MDM / ASSESSMENT AND PLAN / ED COURSE  I reviewed the triage vital signs and the nursing notes.  Differential diagnosis includes, but is not limited to, abscess, cellulitis, sepsis  {Patient presents with symptoms of an acute illness or  injury that is potentially life-threatening.  Patient presents with signs of cellulitis/abscess to her abdominal wall suitable for trial of outpatient management with antibiotics and surgical follow-up.  We systemically well, normal vital signs and no systemic symptoms.  Reassuring blood work without leukocytosis, renal dysfunction or lactic acidosis.  Start on antibiotics and have her follow-up with general surgery, discussed close ED return precautions.  I considered admission for this patient      FINAL CLINICAL IMPRESSION(S) / ED DIAGNOSES   Final diagnoses:  Abscess  Cellulitis of abdominal wall     Rx / DC Orders   ED Discharge Orders          Ordered    doxycycline (VIBRA-TABS) 100 MG tablet  2 times daily        05/11/24 1431    cefUROXime (CEFTIN) 250 MG tablet  2 times daily with meals        05/11/24 1431             Note:  This document was prepared using Dragon voice recognition software and may include unintentional dictation errors.   Claudene Rover,  MD 05/11/24 1440

## 2024-05-11 NOTE — ED Triage Notes (Signed)
 Pt comes with abscess to right lower belly . Pt states this has been going on for three weeks. Pt states she is cancer pt. Pt states drainage, redness and painful. Pt denies any fevers.

## 2024-05-25 ENCOUNTER — Encounter: Payer: Self-pay | Admitting: Internal Medicine

## 2024-05-28 ENCOUNTER — Encounter: Payer: Self-pay | Admitting: *Deleted

## 2024-05-28 ENCOUNTER — Other Ambulatory Visit: Payer: Self-pay | Admitting: *Deleted

## 2024-05-28 DIAGNOSIS — C9000 Multiple myeloma not having achieved remission: Secondary | ICD-10-CM

## 2024-05-29 ENCOUNTER — Inpatient Hospital Stay (HOSPITAL_BASED_OUTPATIENT_CLINIC_OR_DEPARTMENT_OTHER): Admitting: Internal Medicine

## 2024-05-29 ENCOUNTER — Encounter: Payer: Self-pay | Admitting: Internal Medicine

## 2024-05-29 ENCOUNTER — Other Ambulatory Visit: Payer: Self-pay

## 2024-05-29 ENCOUNTER — Inpatient Hospital Stay

## 2024-05-29 ENCOUNTER — Inpatient Hospital Stay: Attending: Internal Medicine

## 2024-05-29 VITALS — BP 115/72 | HR 63

## 2024-05-29 VITALS — BP 131/82 | HR 78 | Temp 97.9°F | Resp 16 | Ht 62.0 in | Wt 153.0 lb

## 2024-05-29 DIAGNOSIS — C9000 Multiple myeloma not having achieved remission: Secondary | ICD-10-CM

## 2024-05-29 DIAGNOSIS — C9002 Multiple myeloma in relapse: Secondary | ICD-10-CM | POA: Diagnosis not present

## 2024-05-29 DIAGNOSIS — Z79620 Long term (current) use of immunosuppressive biologic: Secondary | ICD-10-CM | POA: Insufficient documentation

## 2024-05-29 DIAGNOSIS — Z87891 Personal history of nicotine dependence: Secondary | ICD-10-CM | POA: Diagnosis not present

## 2024-05-29 DIAGNOSIS — Z5112 Encounter for antineoplastic immunotherapy: Secondary | ICD-10-CM | POA: Insufficient documentation

## 2024-05-29 LAB — CBC WITH DIFFERENTIAL (CANCER CENTER ONLY)
Abs Immature Granulocytes: 0.01 K/uL (ref 0.00–0.07)
Basophils Absolute: 0 K/uL (ref 0.0–0.1)
Basophils Relative: 1 %
Eosinophils Absolute: 0.1 K/uL (ref 0.0–0.5)
Eosinophils Relative: 2 %
HCT: 36.7 % (ref 36.0–46.0)
Hemoglobin: 11.9 g/dL — ABNORMAL LOW (ref 12.0–15.0)
Immature Granulocytes: 0 %
Lymphocytes Relative: 45 %
Lymphs Abs: 2.5 K/uL (ref 0.7–4.0)
MCH: 31.6 pg (ref 26.0–34.0)
MCHC: 32.4 g/dL (ref 30.0–36.0)
MCV: 97.6 fL (ref 80.0–100.0)
Monocytes Absolute: 0.5 K/uL (ref 0.1–1.0)
Monocytes Relative: 9 %
Neutro Abs: 2.3 K/uL (ref 1.7–7.7)
Neutrophils Relative %: 43 %
Platelet Count: 168 K/uL (ref 150–400)
RBC: 3.76 MIL/uL — ABNORMAL LOW (ref 3.87–5.11)
RDW: 12.9 % (ref 11.5–15.5)
WBC Count: 5.4 K/uL (ref 4.0–10.5)
nRBC: 0 % (ref 0.0–0.2)

## 2024-05-29 LAB — CMP (CANCER CENTER ONLY)
ALT: 11 U/L (ref 0–44)
AST: 19 U/L (ref 15–41)
Albumin: 4.3 g/dL (ref 3.5–5.0)
Alkaline Phosphatase: 63 U/L (ref 38–126)
Anion gap: 10 (ref 5–15)
BUN: 16 mg/dL (ref 8–23)
CO2: 20 mmol/L — ABNORMAL LOW (ref 22–32)
Calcium: 8.8 mg/dL — ABNORMAL LOW (ref 8.9–10.3)
Chloride: 108 mmol/L (ref 98–111)
Creatinine: 0.73 mg/dL (ref 0.44–1.00)
GFR, Estimated: >60 mL/min (ref >60)
Glucose, Bld: 98 mg/dL (ref 70–99)
Potassium: 4.1 mmol/L (ref 3.5–5.1)
Sodium: 138 mmol/L (ref 135–145)
Total Bilirubin: 0.7 mg/dL (ref 0.0–1.2)
Total Protein: 7.8 g/dL (ref 6.5–8.1)

## 2024-05-29 LAB — TSH: TSH: <0.1 u[IU]/mL — ABNORMAL LOW (ref 0.350–4.500)

## 2024-05-29 MED ORDER — ACETAMINOPHEN 325 MG PO TABS
650.0000 mg | ORAL_TABLET | Freq: Once | ORAL | Status: AC
  Administered 2024-05-29: 650 mg via ORAL
  Filled 2024-05-29: qty 2

## 2024-05-29 MED ORDER — DEXTROSE 5 % IV SOLN
56.0000 mg/m2 | Freq: Once | INTRAVENOUS | Status: AC
  Administered 2024-05-29: 100 mg via INTRAVENOUS
  Filled 2024-05-29: qty 30

## 2024-05-29 MED ORDER — SODIUM CHLORIDE 0.9 % IV SOLN
10.0000 mg/kg | Freq: Once | INTRAVENOUS | Status: AC
  Administered 2024-05-29: 700 mg via INTRAVENOUS
  Filled 2024-05-29: qty 10

## 2024-05-29 MED ORDER — SODIUM CHLORIDE 0.9 % IV SOLN
Freq: Once | INTRAVENOUS | Status: AC
  Filled 2024-05-29: qty 250

## 2024-05-29 MED ORDER — DEXAMETHASONE SOD PHOSPHATE PF 10 MG/ML IJ SOLN
5.0000 mg | Freq: Once | INTRAMUSCULAR | Status: AC
  Administered 2024-05-29: 5 mg via INTRAVENOUS

## 2024-05-29 MED ORDER — TRAMADOL HCL 50 MG PO TABS: 50.0000 mg | ORAL_TABLET | Freq: Two times a day (BID) | ORAL | 0 refills | Status: DC | PRN

## 2024-05-29 MED ORDER — DIPHENHYDRAMINE HCL 50 MG/ML IJ SOLN
50.0000 mg | Freq: Once | INTRAMUSCULAR | Status: AC
  Administered 2024-05-29: 50 mg via INTRAVENOUS
  Filled 2024-05-29: qty 1

## 2024-05-29 MED ORDER — FAMOTIDINE IN NACL 20-0.9 MG/50ML-% IV SOLN
20.0000 mg | Freq: Once | INTRAVENOUS | Status: AC
  Administered 2024-05-29: 20 mg via INTRAVENOUS
  Filled 2024-05-29: qty 50

## 2024-05-29 MED ORDER — VITAMIN D (ERGOCALCIFEROL) 1.25 MG (50000 UNIT) PO CAPS: 50000.0000 [IU] | ORAL_CAPSULE | ORAL | 0 refills | Status: AC

## 2024-05-29 NOTE — Assessment & Plan Note (Addendum)
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   MAY 2024- 1.3. ]  currently on Isa-Dex; d-2 carfil weekly q 28 days. # MARCH 2025- M-protein -0.3 ; kappa lambda light chain ratio-1.8 lambda light chain 63-slowly RISING FEB 2025-chemotherapy was interrupted-until MAY 2025- because of thigh infection needing wound VAC.    # S/p  re-evaluation at [facility] in June 2025- Labs c/w early progression. No evidence of end organ damage-  PET SEP 2025-  No findings of active myeloma.  low-grade activity in a 1.5 cm partially sclerotic lesion in the right distal femoral metaphysis, probably an enchondroma or similar incidental benign lesion. The appearance is not characteristic for myeloma.   # GIVEN PET NEGATIVITY- For now continue with with  Isa-Dex-Carfil- Labs-CBC/chemistries were reviewed with the patient. SEP 2025- MM/K-l light chain improving.   # DROP in EF:  MARCH 2024-Echo [[facility]] Left ventricular EF 50 to 55%;  ECHO  04/23/2024 50-55%- stable.  Will repeat again in dec-jan 2025.   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    # Anemia- JAn 2025- I ron sat- 8%; ferritin- 19 on prn venofer - stable.    # Electrolyte: Hypokalemia: mild-  continue compliance Kdur TID-; hypocalcemia- 8.0-   stable.    # Iatrogenic hypothyroidism [ Graves/goiter s/p total thyroidectomy;aug,2022 ]-currently on Synthroid  175 mcg/day-- elevated TSH- recommend spacing other medications refilled-  stable.    # MAY 2025- thigh infection s/p wound VAC [Dr.Rodenberg]  prn  IVIG prn/monthly- SEP Ig G > 700- monitor for now.     # Glaucoma BIL- L > R [Dr.King; [facility]] - ? Steroid induced on eye drops. decreased the steroids. Discussed with Dr.Haines, ophthalmology- Glaucoma under control- awaiting surgery for retinal detachment-on 11/12. Stable.   # chronic  Lower back discomfort -  Continue tramadol - refilled . Stable.   # Bone lesions /last zometa  on 11/27/2017.  S/p   Dental extraction- on dentures-  PET scan- NO evidence of any bone lesions. ? Zometa   given risk of ONJ.   # Infection prophylaxis: Acyclovir ; asprin- add IVIG infusion [IgG < 400]- plan  IVIG- q 4 w #1 on- 9/17- Stable.   # IV access: mediport- functional  # ACP: Patient has  incurable cancer.  However patient is currently doing well. Patient interested in continue current scope of therapy; and full code.  6 w- given dental extrac- on 1/26. :MM panel; K/l light chain ratio q 4 W  ; NO zometa - dental issues-ok; but hypocalemia  Starting aug 25th- q2w-MD: D-isa-dex;car1&2-car; weekly x 3; week 4-OFF;  q 28 day cycle- pushed off to 6 weeks- 11/04 PS:  # DISPOSITION: # ADD TSH to labs today # chemo today; and tomorrow as planned # order standing orders for 4 weeks- MM panel/k-l light chains- # follow up as per IS-  -Dr.B

## 2024-05-29 NOTE — Patient Instructions (Signed)

## 2024-05-29 NOTE — Progress Notes (Signed)
 [facility] OFFICE PROGRESS NOTE  Patient Care Team: [place] HERO, MD as PCP - General (Family Medicine) End, Lonni, MD as PCP - Cardiology (Cardiology) Guinevere File, [name] Rennie Cindy SAUNDERS, MD as Consulting Physician (Oncology) Parris Manna, MD as Consulting Physician (Pulmonary Disease) Damian Therisa HERO, MD as Consulting Physician (Endocrinology)   Cancer Staging  Multiple myeloma not having achieved remission [facility]) Staging form: Plasma Cell Myeloma and Plasma Cell Disorders, AJCC 8th Edition - Clinical: No stage assigned - Unsigned - Clinical: No stage assigned - Unsigned   Oncology History Overview Note  # SEP 2018- MULTIPLE MYELOMA IgALamda [2.5 gm/dl; K/L= 05/8700]; STAGE III [beta 2 microglobulin=5.5] [presented with acute renal failure; anemia; NO hypercalcemia; Skeletal survey-Normal]; BMBx- 45% plasma cells; FISH-POSITIVE 11:14 translocation.[STANDARD-high RISK]/cyto-Normal; SEP 2018- PET- L3 posterior element lesion.   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 No morphologic support for residual myeloma disease. Negative for minimal residual disease by MM-MRD flow cytometry; HOLD REVLIMID  [leg swelling]; AUG 2021-PET scan negative for myeloma; continue to hold Revlimid   # MARCH 2021-diastolic congestive heart failure [Dr.End]  # BMBx- OCT 2021- BMBx- 5% plasmacytosis-however this appears to be more polyclonal  rather than monoclonal-not explain patient worsening anemia.   # OCT 2022- 18th- Dara- Rev-Dex; MARCH, 2024- PROGRESSION bone marrow biopsy 70% plasma cells; PET scan no bone lesions-however conus medullaris uptake-MRI lumbar spine-   # MAY 2024- DISCONTINUE carfilzomib -dexamethasone - Pomalyst ; [rising M-protein; MAY 2024- 1.3. ]   # start 12/27/2022 -Isa-Carfil-Dex;  [decrease dexamethasone  20 weekly- starting 05/04/23- sec to glaucoma].   # OCT 24th, 2024- Bone marrow Biopsy: The findings are consistent with  focal minimal residual lambda restricted plasma cell neoplasm- HOLD treatment sec to glaucoma; and drop in EF AUG 2024- 45-50% [March 2024- 50-55%]  # MARCH 16th, 2025- re-started Isa-Carfil-Dex;  #November 2021-hyperthyroidism/goiter- [D.Bennett/Dr.Solum]-methimazole . S/p Thyroidectomy [Dr.Kim; [facility]-AUG 2022]  --------------------------  # 12/12- RIGHT JUGULAR DVT-x 8m on xarelto ; finished April 2020; September 2020-EGD/dysphagia; Dr. Therisa  # Acute renal failure [Dr.Singh; Proteinuria 1.5gm/day ]; acyclovir Mickeal ------------------------------------------------------------------------------------------------------------   DIAGNOSIS: [ ]  MULTIPLE MYELOMA  STAGE: III/HIGH RISK ;GOALS: CONTROL      Multiple myeloma not having achieved remission (HCC)  11/21/2017 - 04/28/2018 Chemotherapy   Patient is on Treatment Plan : MYELOMA SALVAGE Cyclophosphamide  / Carfilzomib  / Dexamethasone  (CCd) q28d     05/12/2021 - 02/25/2022 Chemotherapy   Patient is on Treatment Plan : MYELOMA RELAPSED REFRACTORY Daratumumab  SQ + Lenalidomide  + Dexamethasone  (DaraRd) q28d     05/12/2021 - 07/23/2022 Chemotherapy   Patient is on Treatment Plan : MYELOMA RELAPSED REFRACTORY Daratumumab  SQ + Lenalidomide  + Dexamethasone  (DaraRd) q28d     11/02/2022 - 11/16/2022 Chemotherapy   Patient is on Treatment Plan : MYELOMA RELAPSED/ REFRACTORY Carfilzomib  D1,8,15 (20/27) + Pomalidomide  + Dexamethasone  (KPd) q28d      12/27/2022 -  Chemotherapy   Patient is on Treatment Plan : MYELOMA Carfilzomib  + Dexamethasone  + Isatuximab  q28d      INTERVAL HISTORY: Ambulating  independently.  Alone.  Brandi  E [name] 73 y.o.  female pleasant patient above history of relapsed multiple myeloma--currently re-started back after 3 months break [JULY 28th, 2025]  Isa- Carfil-Dex  is here for a follow up.   Patient doing well; Patient completed antibiotic for abscess on abdomen.   Continues to have pain in left shoulder with limited movement. Patient requesting refill on Vitamin D  and Tramadol .   No fevers or chills.. Denies any worsening pain. Patient taking tramadol  as needed for upper and leg cramps.   No difficulty breathing. No worsening leg swelling.    Review of Systems  Constitutional:  Positive for malaise/fatigue. Negative for chills, diaphoresis and fever.  HENT:  Negative for nosebleeds and sore throat.   Eyes:  Negative for double vision.  Respiratory:  Negative for hemoptysis.   Cardiovascular:  Negative for chest pain, palpitations and orthopnea.  Gastrointestinal:  Negative for abdominal pain, blood in stool, constipation, heartburn and melena.  Genitourinary:  Negative for dysuria, frequency and urgency.  Musculoskeletal:  Positive for back pain.  Skin: Negative.  Negative for itching and rash.  Neurological:  Negative for dizziness, tingling, focal weakness, weakness and headaches.  Endo/Heme/Allergies:  Does not bruise/bleed easily.  Psychiatric/Behavioral:  Negative for depression. The patient is not nervous/anxious.     PAST MEDICAL HISTORY :  Past Medical History:  Diagnosis Date   (HFpEF) heart failure with preserved ejection fraction (HCC)    a. 06/2020 Echo: EF 60-65%, no rwma, nl RV size/fxn. Triv MR. Triv TR/AI. Mod elev PASP.   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   Open wound of left thigh 12/15/2023   Pneumonia    Red blood cell antibody positive, compatible PRBC difficult to obtain    S/P autologous bone marrow transplantation (HCC)    Sepsis (HCC) 11/25/2023    PAST SURGICAL HISTORY :   Past Surgical History:  Procedure Laterality Date   ANTERIOR VITRECTOMY Left 10/07/2021   Procedure: ANTERIOR VITRECTOMY;  Surgeon: Mittie Gaskin, MD;  Location: [facility];  Service: Ophthalmology;  Laterality: Left;   CATARACT EXTRACTION W/PHACO Left 10/07/2021   Procedure: CATARACT EXTRACTION PHACO AND INTRAOCULAR LENS PLACEMENT (IOC) LEFT VISION BLUE;  Surgeon: Mittie Gaskin, MD;  Location: [facility];  Service: Ophthalmology;  Laterality: Left;  kit for manual incision 14.56 01:21.4   CATARACT EXTRACTION W/PHACO Right 10/21/2021   Procedure: CATARACT EXTRACTION PHACO AND INTRAOCULAR LENS PLACEMENT (IOC) RIGHT 11.12 01:48.1;  Surgeon: Mittie Gaskin, MD;  Location: [facility];  Service: Ophthalmology;  Laterality: Right;   ESOPHAGOGASTRODUODENOSCOPY (EGD) WITH PROPOFOL  N/A 03/30/2019   Procedure: ESOPHAGOGASTRODUODENOSCOPY (EGD) WITH PROPOFOL ;  Surgeon: Therisa Bi, MD;  Location: [facility] ENDOSCOPY;  Service: Gastroenterology;  Laterality: N/A;   INCISION AND DRAINAGE ABSCESS Left 12/01/2023   Procedure: INCISION AND DRAINAGE, ABSCESS;  Surgeon: Lane Shope, MD;  Location: [facility] ORS;  Service: General;  Laterality: Left;   IR BONE MARROW BIOPSY & ASPIRATION  05/25/2023   IR FLUORO GUIDE PORT INSERTION RIGHT  12/16/2017   IR IMAGING GUIDED PORT INSERTION  06/15/2021   KNEE ARTHROSCOPY Left 09/03/2022   Procedure: ARTHROSCOPY KNEE DEBRIDEMENT;  Surgeon: Leora Lynwood SAUNDERS, MD;  Location: [facility] ORS;  Service: Orthopedics;  Laterality: Left;  THYROIDECTOMY N/A      FAMILY HISTORY :   Family History  Problem Relation Age of Onset   Pneumonia Mother    Seizures Father     SOCIAL HISTORY:   Social History   Tobacco Use   Smoking status: Former    Current packs/day: 0.00    Types: Cigarettes    Quit date: 03/18/2021    Years since quitting: 3.2   Smokeless tobacco: Never   Tobacco comments:    2 cigarettes QOD  Vaping Use   Vaping status: Never Used  Substance Use Topics   Alcohol use: No   Drug use: No    ALLERGIES:  has no known allergies.  MEDICATIONS:  Current Outpatient Medications  Medication Sig Dispense Refill   acyclovir  (ZOVIRAX ) 400 MG tablet Take 1 tablet (400 mg total) by mouth 2 (two) times daily. 120 tablet 2   albuterol  (VENTOLIN  HFA) 108 (90 Base) MCG/ACT inhaler Inhale 2 puffs into the lungs every 6 (six) hours as needed for wheezing or shortness of breath. 8 g 3   aspirin  EC 81 MG EC tablet Take 1 tablet (81 mg total) by mouth daily. 90 tablet 3   Brinzolamide -Brimonidine  (SIMBRINZA) 1-0.2 % SUSP Apply 1 drop to eye in the morning and at bedtime. I drop both eyes twice a day     Calcium  Carb-Cholecalciferol 600-10 MG-MCG TABS Take 1 tablet by mouth daily.     calcium  carbonate (CALCIUM  600 HIGH POTENCY) 600 MG TABS tablet Take 2 tablets (1,200 mg total) by mouth 2 (two) times daily. 120 tablet 2   furosemide  (LASIX ) 40 MG tablet Take 1 tablet (40 mg total) by mouth daily as needed for edema or fluid.     levothyroxine  (SYNTHROID ) 175 MCG tablet Take 1 tablet (175 mcg total) by mouth daily. For thyroid  90 tablet 1   lidocaine -prilocaine  (EMLA ) cream Apply 1 application topically as needed. Apply to port and cover with saran wrap 1-2 hours prior to port access 30 g 1   magnesium  oxide (MAG-OX) 400 (240 Mg) MG tablet Take 1 tablet (400 mg total) by mouth 2 (two) times daily. 90 tablet 1   montelukast  (SINGULAIR ) 10 MG tablet Take 1 tablet (10 mg total) by mouth at bedtime. 30 tablet 3   NIFEdipine (ADALAT CC) 30 MG 24  hr tablet Take 30 mg by mouth daily.     pantoprazole  (PROTONIX ) 40 MG tablet Take 1 tablet (40 mg total) by mouth daily. 90 tablet 0   polyethylene glycol (MIRALAX  / GLYCOLAX ) packet Take 17 g by mouth daily as needed for mild constipation. 14 each 0   spironolactone  (ALDACTONE ) 25 MG tablet Take 1 tablet (25 mg total) by mouth daily as needed (edema). 90 tablet 1   traMADol  (ULTRAM ) 50 MG tablet Take 1 tablet (50 mg total) by mouth every 12 (twelve) hours as needed. 60 tablet 0   Vitamin D , Ergocalciferol , (DRISDOL ) 1.25 MG (50000 UNIT) CAPS capsule Take 1 capsule (50,000 Units total) by mouth once a week. 12 capsule 0   No current facility-administered medications for this visit.   Facility-Administered Medications Ordered in Other Visits  Medication Dose Route Frequency Provider Last Rate Last Admin   0.9 %  sodium chloride  infusion   Intravenous Once Kazaria Gaertner R, MD       carfilzomib  (KYPROLIS ) 100 mg in dextrose  5 % 100 mL chemo infusion  56 mg/m2 (Treatment Plan Recorded) Intravenous Once Kani Chauvin R, MD  dexamethasone  (DECADRON ) injection 5 mg  5 mg Intravenous Once Barnabas Henriques R, MD       famotidine  (PEPCID ) IVPB 20 mg premix  20 mg Intravenous Once Jaqualyn Juday R, MD 200 mL/hr at 05/29/24 0959 20 mg at 05/29/24 9040   isatuximab -irfc (SARCLISA ) 700 mg in sodium chloride  0.9 % 215 mL (2.8 mg/mL) chemo infusion  10 mg/kg (Treatment Plan Recorded) Intravenous Once Rei Medlen R, MD        PHYSICAL EXAMINATION:   BP 131/82 (BP Location: Left Arm, Patient Position: Sitting, Cuff Size: Normal)   Pulse 78   Temp 97.9 F (36.6 C) (Tympanic)   Resp 16   Ht 5' 2 (1.575 m)   Wt 153 lb (69.4 kg)   SpO2 100%   BMI 27.98 kg/m   Filed Weights   05/29/24 0830  Weight: 153 lb (69.4 kg)         Mild leg swelling.   Physical Exam HENT:     Head: Normocephalic and atraumatic.  Eyes:     Pupils: Pupils are equal, round, and  reactive to light.  Cardiovascular:     Rate and Rhythm: Normal rate and regular rhythm.  Pulmonary:     Effort: Pulmonary effort is normal. No respiratory distress.     Breath sounds: Normal breath sounds. No wheezing.  Abdominal:     General: Bowel sounds are normal. There is no distension.     Palpations: Abdomen is soft. There is no mass.     Tenderness: There is no abdominal tenderness. There is no guarding or rebound.  Musculoskeletal:        General: No tenderness. Normal range of motion.     Cervical back: Normal range of motion and neck supple.  Skin:    General: Skin is warm.  Neurological:     Mental Status: She is alert and oriented to person, place, and time.  Psychiatric:        Mood and Affect: Affect normal.    LABORATORY DATA:  I have reviewed the data as listed    Component Value Date/Time   NA 138 05/29/2024 0834   NA 141 01/25/2020 1530   K 4.1 05/29/2024 0834   CL 108 05/29/2024 0834   CO2 20 (L) 05/29/2024 0834   GLUCOSE 98 05/29/2024 0834   BUN 16 05/29/2024 0834   BUN 19 01/25/2020 1530   CREATININE 0.73 05/29/2024 0834   CALCIUM  8.8 (L) 05/29/2024 0834   PROT 7.8 05/29/2024 0834   ALBUMIN 4.3 05/29/2024 0834   AST 19 05/29/2024 0834   ALT 11 05/29/2024 0834   ALKPHOS 63 05/29/2024 0834   BILITOT 0.7 05/29/2024 0834   GFRNONAA >60 05/29/2024 0834   GFRAA NOT CALCULATED 04/17/2020 1352    No results found for: SPEP, UPEP  Lab Results  Component Value Date   WBC 5.4 05/29/2024   NEUTROABS 2.3 05/29/2024   HGB 11.9 (L) 05/29/2024   HCT 36.7 05/29/2024   MCV 97.6 05/29/2024   PLT 168 05/29/2024      Chemistry      Component Value Date/Time   NA 138 05/29/2024 0834   NA 141 01/25/2020 1530   K 4.1 05/29/2024 0834   CL 108 05/29/2024 0834   CO2 20 (L) 05/29/2024 0834   BUN 16 05/29/2024 0834   BUN 19 01/25/2020 1530   CREATININE 0.73 05/29/2024 0834      Component Value Date/Time   CALCIUM  8.8 (L) 05/29/2024 0834   ALKPHOS  63 05/29/2024 0834   AST 19 05/29/2024 0834   ALT 11 05/29/2024 0834   BILITOT 0.7 05/29/2024 0834      (H): Data is abnormally high   Latest Reference Range & Units Most Recent 12/24/20 14:01 04/01/21 14:34 04/28/21 09:17 06/11/21 09:42 07/03/21 10:32 08/20/21 10:06  M Protein SerPl Elph-Mcnc Not Observed g/dL 1.1 (H) (C) 8/73/76 89:93 Not Observed (C) 0.6 (H) (C) 0.7 (H) (C) 0.5 (H) (C) 0.7 (H) (C) 1.1 (H) (C)    Latest Reference Range & Units Most Recent 09/17/21 08:47 10/15/21 08:25 11/12/21 08:55  Kappa free light chain 3.3 - 19.4 mg/L 5.0 11/12/21 08:55 7.2 7.2 5.0  Lambda free light chains 5.7 - 26.3 mg/L 223.6 (H) 11/12/21 08:55 215.3 (H) 340.2 (H) 223.6 (H)  Kappa, lambda light chain ratio 0.26 - 1.65  0.02 (L) 11/12/21 08:55 0.03 (L) 0.02 (L) 0.02 (L)  (H): Data is abnormally high (L): Data is abnormally low  RADIOGRAPHIC STUDIES: I have personally reviewed the radiological images as listed and agreed with the findings in the report. No results found.    ASSESSMENT & PLAN:  Multiple myeloma not having achieved remission (HCC) # RECURRENT Multiple myeloma stage III [high-risk cytogenetics-status post KRD-VGPR ; status post autologous stem cell transplant on 06/15/18.   MAY 2024- 1.3. ]  currently on Isa-Dex; d-2 carfil weekly q 28 days. # MARCH 2025- M-protein -0.3 ; kappa lambda light chain ratio-1.8 lambda light chain 63-slowly RISING FEB 2025-chemotherapy was interrupted-until MAY 2025- because of thigh infection needing wound VAC.    # S/p  re-evaluation at [facility] in June 2025- Labs c/w early progression. No evidence of end organ damage-  PET SEP 2025-  No findings of active myeloma.  low-grade activity in a 1.5 cm partially sclerotic lesion in the right distal femoral metaphysis, probably an enchondroma or similar incidental benign lesion. The appearance is not characteristic for myeloma.   # GIVEN PET NEGATIVITY- For now continue with with  Isa-Dex-Carfil-  Labs-CBC/chemistries were reviewed with the patient. SEP 2025- MM/K-l light chain improving.   # DROP in EF:  MARCH 2024-Echo [[facility]] Left ventricular EF 50 to 55%;  ECHO  04/23/2024 50-55%- stable.  Will repeat again in dec-jan 2025.   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    # Anemia- JAn 2025- I ron sat- 8%; ferritin- 19 on prn venofer - stable.    # Electrolyte: Hypokalemia: mild-  continue compliance Kdur TID-; hypocalcemia- 8.0-   stable.    # Iatrogenic hypothyroidism [ Graves/goiter s/p total thyroidectomy;aug,2022 ]-currently on Synthroid  175 mcg/day-- elevated TSH- recommend spacing other medications refilled-  stable.    # MAY 2025- thigh infection s/p wound VAC [Dr.Rodenberg]  prn  IVIG prn/monthly- SEP Ig G > 700- monitor for now.     # Glaucoma BIL- L > R [Dr.King; [facility]] - ? Steroid induced on eye drops. decreased the steroids. Discussed with Dr.Haines, ophthalmology- Glaucoma under control- awaiting surgery for retinal detachment-on 11/12. Stable.   # chronic  Lower back discomfort - Continue tramadol - refilled . Stable.   # Bone lesions /last zometa  on 11/27/2017.  S/p   Dental extraction- on dentures-  PET scan- NO evidence of any bone lesions. ? Zometa   given risk of ONJ.   # Infection prophylaxis: Acyclovir ; asprin- add IVIG infusion [IgG < 400]- plan  IVIG- q 4 w #1 on- 9/17- Stable.   # IV access: mediport- functional  # ACP:  Patient has  incurable cancer.  However patient is currently doing well. Patient interested in continue current scope of therapy; and full code.  6 w- given dental extrac- on 1/26. :MM panel; K/l light chain ratio q 4 W  ; NO zometa - dental issues-ok; but hypocalemia  Starting aug 25th- q2w-MD: D-isa-dex;car1&2-car; weekly x 3; week 4-OFF;  q 28 day cycle- pushed off to 6 weeks- 11/04 PS:  # DISPOSITION: # ADD TSH to labs today # chemo today; and tomorrow as planned # order  standing orders for 4 weeks- MM panel/k-l light chains- # follow up as per IS-  -Dr.B   Orders Placed This Encounter  Procedures   CBC with Differential (Cancer Center Only)    Standing Status:   Future    Expected Date:   06/26/2024    Expiration Date:   06/26/2025   CMP (Cancer Center only)    Standing Status:   Future    Expected Date:   06/26/2024    Expiration Date:   06/26/2025   CBC with Differential (Cancer Center Only)    Standing Status:   Future    Expected Date:   07/03/2024    Expiration Date:   07/03/2025   CMP (Cancer Center only)    Standing Status:   Future    Expected Date:   07/03/2024    Expiration Date:   07/03/2025   CBC with Differential (Cancer Center Only)    Standing Status:   Future    Expected Date:   07/10/2024    Expiration Date:   07/10/2025   CMP (Cancer Center only)    Standing Status:   Future    Expected Date:   07/10/2024    Expiration Date:   07/10/2025   TSH    Standing Status:   Future    Number of Occurrences:   1    Expected Date:   05/29/2024    Expiration Date:   05/29/2025   Multiple Myeloma Panel (SPEP&IFE w/QIG)    Standing Status:   Standing    Number of Occurrences:   4    Expiration Date:   05/29/2025   Kappa/lambda light chains    Standing Status:   Standing    Number of Occurrences:   4    Expiration Date:   05/29/2025   All questions were answered. The patient knows to call the clinic with any problems, questions or concerns.      Cindy JONELLE Joe, MD 05/29/2024 10:12 AM End

## 2024-05-29 NOTE — Progress Notes (Signed)
 Patient doing well; Patient completed antibiotic for abscess on stomach. Patient requesting refill on Vitamin D  and Tramadol .

## 2024-05-30 ENCOUNTER — Telehealth: Payer: Self-pay

## 2024-05-30 ENCOUNTER — Inpatient Hospital Stay

## 2024-05-30 ENCOUNTER — Other Ambulatory Visit: Payer: Self-pay

## 2024-05-30 VITALS — BP 138/76 | HR 64 | Temp 99.0°F | Resp 16

## 2024-05-30 DIAGNOSIS — C9000 Multiple myeloma not having achieved remission: Secondary | ICD-10-CM

## 2024-05-30 DIAGNOSIS — Z5112 Encounter for antineoplastic immunotherapy: Secondary | ICD-10-CM | POA: Diagnosis not present

## 2024-05-30 LAB — KAPPA/LAMBDA LIGHT CHAINS
Kappa free light chain: 1.7 mg/L — ABNORMAL LOW (ref 3.3–19.4)
Kappa, lambda light chain ratio: 0.01 — ABNORMAL LOW (ref 0.26–1.65)
Lambda free light chains: 115.7 mg/L — ABNORMAL HIGH (ref 5.7–26.3)

## 2024-05-30 MED ORDER — SODIUM CHLORIDE 0.9 % IV SOLN
Freq: Once | INTRAVENOUS | Status: AC
  Filled 2024-05-30: qty 250

## 2024-05-30 MED ORDER — DEXAMETHASONE SOD PHOSPHATE PF 10 MG/ML IJ SOLN
5.0000 mg | Freq: Once | INTRAMUSCULAR | Status: AC
  Administered 2024-05-30: 5 mg via INTRAVENOUS

## 2024-05-30 MED ORDER — DEXTROSE 5 % IV SOLN
56.0000 mg/m2 | Freq: Once | INTRAVENOUS | Status: AC
  Administered 2024-05-30: 100 mg via INTRAVENOUS
  Filled 2024-05-30: qty 30

## 2024-05-30 NOTE — Telephone Encounter (Signed)
 Clinical Social Work received a message from [employer] that patient was interested in completing her [employer].  Attempted to contact patient by phone.  Left a voicemail with contact information.

## 2024-06-01 LAB — MULTIPLE MYELOMA PANEL, SERUM
Albumin SerPl Elph-Mcnc: 3.7 g/dL (ref 2.9–4.4)
Albumin/Glob SerPl: 1.2 (ref 0.7–1.7)
Alpha 1: 0.2 g/dL (ref 0.0–0.4)
Alpha2 Glob SerPl Elph-Mcnc: 0.8 g/dL (ref 0.4–1.0)
B-Globulin SerPl Elph-Mcnc: 1.4 g/dL — ABNORMAL HIGH (ref 0.7–1.3)
Gamma Glob SerPl Elph-Mcnc: 1 g/dL (ref 0.4–1.8)
Globulin, Total: 3.3 g/dL (ref 2.2–3.9)
IgA: 348 mg/dL (ref 64–422)
IgG (Immunoglobin G), Serum: 745 mg/dL (ref 586–1602)
IgM (Immunoglobulin M), Srm: <5 mg/dL — ABNORMAL LOW (ref 26–217)
M Protein SerPl Elph-Mcnc: 0.3 g/dL — ABNORMAL HIGH
Total Protein ELP: 7 g/dL (ref 6.0–8.5)

## 2024-06-05 ENCOUNTER — Other Ambulatory Visit

## 2024-06-05 ENCOUNTER — Ambulatory Visit

## 2024-06-05 ENCOUNTER — Encounter: Payer: Self-pay | Admitting: Internal Medicine

## 2024-06-06 ENCOUNTER — Ambulatory Visit

## 2024-06-12 ENCOUNTER — Encounter: Payer: Self-pay | Admitting: Internal Medicine

## 2024-06-12 ENCOUNTER — Inpatient Hospital Stay

## 2024-06-12 ENCOUNTER — Inpatient Hospital Stay: Admitting: Internal Medicine

## 2024-06-12 VITALS — BP 122/80 | HR 67 | Temp 97.6°F | Resp 16 | Ht 62.0 in | Wt 157.5 lb

## 2024-06-12 VITALS — BP 117/61 | HR 72

## 2024-06-12 DIAGNOSIS — E032 Hypothyroidism due to medicaments and other exogenous substances: Secondary | ICD-10-CM

## 2024-06-12 DIAGNOSIS — E876 Hypokalemia: Secondary | ICD-10-CM

## 2024-06-12 DIAGNOSIS — Z5112 Encounter for antineoplastic immunotherapy: Secondary | ICD-10-CM | POA: Diagnosis not present

## 2024-06-12 DIAGNOSIS — C9000 Multiple myeloma not having achieved remission: Secondary | ICD-10-CM

## 2024-06-12 LAB — CBC WITH DIFFERENTIAL (CANCER CENTER ONLY)
Abs Immature Granulocytes: 0.01 K/uL (ref 0.00–0.07)
Basophils Absolute: 0 K/uL (ref 0.0–0.1)
Basophils Relative: 1 %
Eosinophils Absolute: 0.1 K/uL (ref 0.0–0.5)
Eosinophils Relative: 2 %
HCT: 35.7 % — ABNORMAL LOW (ref 36.0–46.0)
Hemoglobin: 11.5 g/dL — ABNORMAL LOW (ref 12.0–15.0)
Immature Granulocytes: 0 %
Lymphocytes Relative: 54 %
Lymphs Abs: 1.8 K/uL (ref 0.7–4.0)
MCH: 31.3 pg (ref 26.0–34.0)
MCHC: 32.2 g/dL (ref 30.0–36.0)
MCV: 97.3 fL (ref 80.0–100.0)
Monocytes Absolute: 0.3 K/uL (ref 0.1–1.0)
Monocytes Relative: 11 %
Neutro Abs: 1 K/uL — ABNORMAL LOW (ref 1.7–7.7)
Neutrophils Relative %: 32 %
Platelet Count: 247 K/uL (ref 150–400)
RBC: 3.67 MIL/uL — ABNORMAL LOW (ref 3.87–5.11)
RDW: 13.1 % (ref 11.5–15.5)
WBC Count: 3.3 K/uL — ABNORMAL LOW (ref 4.0–10.5)
nRBC: 0 % (ref 0.0–0.2)

## 2024-06-12 LAB — CMP (CANCER CENTER ONLY)
ALT: 11 U/L (ref 0–44)
AST: 16 U/L (ref 15–41)
Albumin: 4 g/dL (ref 3.5–5.0)
Alkaline Phosphatase: 64 U/L (ref 38–126)
Anion gap: 10 (ref 5–15)
BUN: 15 mg/dL (ref 8–23)
CO2: 21 mmol/L — ABNORMAL LOW (ref 22–32)
Calcium: 8.7 mg/dL — ABNORMAL LOW (ref 8.9–10.3)
Chloride: 109 mmol/L (ref 98–111)
Creatinine: 0.62 mg/dL (ref 0.44–1.00)
GFR, Estimated: >60 mL/min (ref >60)
Glucose, Bld: 99 mg/dL (ref 70–99)
Potassium: 3.6 mmol/L (ref 3.5–5.1)
Sodium: 140 mmol/L (ref 135–145)
Total Bilirubin: 0.6 mg/dL (ref 0.0–1.2)
Total Protein: 7.3 g/dL (ref 6.5–8.1)

## 2024-06-12 MED ORDER — DIPHENHYDRAMINE HCL 50 MG/ML IJ SOLN
50.0000 mg | Freq: Once | INTRAMUSCULAR | Status: AC
  Administered 2024-06-12: 50 mg via INTRAVENOUS
  Filled 2024-06-12: qty 1

## 2024-06-12 MED ORDER — SODIUM CHLORIDE 0.9 % IV SOLN
10.0000 mg/kg | Freq: Once | INTRAVENOUS | Status: AC
  Administered 2024-06-12: 700 mg via INTRAVENOUS
  Filled 2024-06-12: qty 25

## 2024-06-12 MED ORDER — LEVOTHYROXINE SODIUM 150 MCG PO TABS: 150.0000 ug | ORAL_TABLET | Freq: Every day | ORAL | 1 refills | Status: DC

## 2024-06-12 MED ORDER — DEXAMETHASONE SOD PHOSPHATE PF 10 MG/ML IJ SOLN
5.0000 mg | Freq: Once | INTRAMUSCULAR | Status: AC
  Administered 2024-06-12: 5 mg via INTRAVENOUS

## 2024-06-12 MED ORDER — FAMOTIDINE IN NACL 20-0.9 MG/50ML-% IV SOLN
20.0000 mg | Freq: Once | INTRAVENOUS | Status: AC
  Administered 2024-06-12: 20 mg via INTRAVENOUS
  Filled 2024-06-12: qty 50

## 2024-06-12 MED ORDER — ACETAMINOPHEN 325 MG PO TABS
650.0000 mg | ORAL_TABLET | Freq: Once | ORAL | Status: AC
  Administered 2024-06-12: 650 mg via ORAL
  Filled 2024-06-12: qty 2

## 2024-06-12 MED ORDER — SODIUM CHLORIDE 0.9 % IV SOLN
Freq: Once | INTRAVENOUS | Status: DC
  Filled 2024-06-12: qty 250

## 2024-06-12 MED ORDER — SODIUM CHLORIDE 0.9 % IV SOLN
Freq: Once | INTRAVENOUS | Status: AC
  Filled 2024-06-12: qty 250

## 2024-06-12 MED ORDER — DEXTROSE 5 % IV SOLN
56.0000 mg/m2 | Freq: Once | INTRAVENOUS | Status: AC
  Administered 2024-06-12: 100 mg via INTRAVENOUS
  Filled 2024-06-12: qty 30

## 2024-06-12 NOTE — Assessment & Plan Note (Addendum)
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   MAY 2024- 1.3. ]  currently on Isa-Dex; d-2 carfil weekly q 28 days. # MARCH 2025- M-protein -0.3 ; kappa lambda light chain ratio-1.8 lambda light chain 63-slowly RISING FEB 2025-chemotherapy was interrupted-until MAY 2025- because of thigh infection needing wound VAC.    # S/p re-evaluation at [facility] in June 2025- Labs c/w early progression. No evidence of end organ damage-  PET SEP 2025-  No findings of active myeloma.  low-grade activity in a 1.5 cm partially sclerotic lesion in the right distal femoral metaphysis, probably an enchondroma or similar incidental benign lesion. The appearance is not characteristic for myeloma.   # GIVEN PET NEGATIVITY- For now continue with with  Isa-Dex-Carfil- Labs-CBC/chemistries were reviewed with the patient. OCT 2025- MM/K-l light chain- RISING.  Will monitor closely-and repeat PET scan/bone marrow biopsy to confirm progression.  If clinical progression noted would recommend evaluation again at [facility] for CAR-T/bispecific's  # DROP in EF:  MARCH 2024-Echo [[facility]] Left ventricular EF 50 to 55%;  ECHO  04/23/2024 50-55%- stable.  Will repeat again in dec-jan 2025.   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    # Anemia- JAn 2025- I ron sat- 8%; ferritin- 19 on prn venofer - stable.    # Electrolyte: Hypokalemia: mild-  continue compliance Kdur TID-; hypocalcemia- 8.0-   stable.    # Iatrogenic hypothyroidism [ Graves/goiter s/p total thyroidectomy;aug,2022 ]-currently on Synthroid  175 mcg/day-; will decrease the dose to 150 -  TSH < 0.01- will repeat  # MAY 2025- thigh infection s/p wound VAC [Dr.Rodenberg]  prn  IVIG prn/monthly- SEP Ig G > 700- monitor for now.     # Glaucoma BIL- L > R [Dr.King; [facility]] - ? Steroid induced on eye drops. decreased the steroids.  Discussed with Dr.Haines, ophthalmology- Glaucoma under control- awaiting surgery for retinal detachment-on 11/12. Stable.   # chronic  Lower back discomfort - Continue tramadol - refilled . Stable.   # Bone lesions /last zometa  on 11/27/2017.  S/p   Dental extraction- on dentures-  PET scan- NO evidence of any bone lesions. ? Zometa   given risk of ONJ.   # Infection prophylaxis: Acyclovir ; asprin- add IVIG infusion [IgG < 400]- plan  IVIG- q 4 w #1 on- 9/17- Stable.   # IV access: mediport- functional  # ACP: Patient has  incurable cancer.  However patient is currently doing well. Patient interested in continue current scope of therapy; and full code.  6 w- given dental extrac- on 1/26. :MM panel; K/l light chain ratio q 4 W  ; NO zometa - dental issues-ok; but hypocalemia  Starting aug 25th- q2w-MD: D-isa-dex;car1&2-car; weekly x 3; week 4-OFF;  q 28 day cycle- pushed off to 6 weeks- 11/04 PS:  # DISPOSITION: # chemo today; and tomorrow as planned # Order Thyroid  panel in 8 weeks # order standing orders for 4 weeks- MM panel/k-l light chains- # follow up as per IS-  -Dr.B

## 2024-06-12 NOTE — Patient Instructions (Signed)
 [facility] - A DEPT OF [facility]  Discharge Instructions: Thank you for choosing [facility] to provide your oncology and hematology care.  If you have a lab appointment with the Cancer Center, please go directly to the Cancer Center and check in at the registration area.  Wear comfortable clothing and clothing appropriate for easy access to any Portacath or PICC line.   We strive to give you quality time with your provider. You may need to reschedule your appointment if you arrive late (15 or more minutes).  Arriving late affects you and other patients whose appointments are after yours.  Also, if you miss three or more appointments without notifying the office, you may be dismissed from the clinic at the provider's discretion.      For prescription refill requests, have your pharmacy contact our office and allow 72 hours for refills to be completed.    Today you received the following chemotherapy and/or immunotherapy agents SARCALISA and KYPHOLIS      To help prevent nausea and vomiting after your treatment, we encourage you to take your nausea medication as directed.  BELOW ARE SYMPTOMS THAT SHOULD BE REPORTED IMMEDIATELY: *FEVER GREATER THAN 100.4 F (38 C) OR HIGHER *CHILLS OR SWEATING *NAUSEA AND VOMITING THAT IS NOT CONTROLLED WITH YOUR NAUSEA MEDICATION *UNUSUAL SHORTNESS OF BREATH *UNUSUAL BRUISING OR BLEEDING *URINARY PROBLEMS (pain or burning when urinating, or frequent urination) *BOWEL PROBLEMS (unusual diarrhea, constipation, pain near the anus) TENDERNESS IN MOUTH AND THROAT WITH OR WITHOUT PRESENCE OF ULCERS (sore throat, sores in mouth, or a toothache) UNUSUAL RASH, SWELLING OR PAIN  UNUSUAL VAGINAL DISCHARGE OR ITCHING   Items with * indicate a potential emergency and should be followed up as soon as possible or go to the Emergency Department if any problems should occur.  Please show the CHEMOTHERAPY ALERT CARD or  IMMUNOTHERAPY ALERT CARD at check-in to the Emergency Department and triage nurse.  Should you have questions after your visit or need to cancel or reschedule your appointment, please contact [facility] - A DEPT OF [facility]  [phone] and follow the prompts.  Office hours are 8:00 a.m. to 4:30 p.m. Monday - Friday. Please note that voicemails left after 4:00 p.m. may not be returned until the following business day.  We are closed weekends and major holidays. You have access to a nurse at all times for urgent questions. Please call the main number to the clinic [phone] and follow the prompts.  For any non-urgent questions, you may also contact your provider using MyChart. We now offer e-Visits for anyone 52 and older to request care online for non-urgent symptoms. For details visit [email or web address].   Also download the MyChart app! Go to the app store, search MyChart, open the app, select [facility], and log in with your MyChart username and password.  Isatuximab  Injection What is this medication? ISATUXIMAB  (EYE sa TUX i mab) treats a type of bone marrow cancer (multiple myeloma). It works by blocking a protein that causes cancer cells to grow and multiply. This helps to slow or stop the spread of cancer cells. This medicine may be used for other purposes; ask your health care provider or pharmacist if you have questions. COMMON BRAND NAME(S): SARCLISA  What should I tell my care team before I take this medication? They need to know if you have any of these conditions: Heart disease Infection, especially  a viral infection, such as chickenpox, cold sores, herpes An unusual or allergic reaction to isatuximab , other medications, foods, dyes, or preservatives Pregnant or trying to get pregnant Breast-feeding How should I use this medication? This medication is injected into a vein. It is given by your care team in a hospital or clinic  setting. Talk to your care team about the use of this medication in children. Special care may be needed. Overdosage: If you think you have taken too much of this medicine contact a poison control center or emergency room at once. NOTE: This medicine is only for you. Do not share this medicine with others. What if I miss a dose? Keep appointments for follow-up doses. It is important not to miss your dose. Call your care team if you are unable to keep an appointment. What may interact with this medication? Interactions have not been studied. This list may not describe all possible interactions. Give your health care provider a list of all the medicines, herbs, non-prescription drugs, or dietary supplements you use. Also tell them if you smoke, drink alcohol, or use illegal drugs. Some items may interact with your medicine. What should I watch for while using this medication? Your condition will be monitored carefully while you are receiving this medication. You may need blood work done while you are taking this medication. This medication may increase your risk of getting an infection. Call your care team for advice if you get a fever, chills, sore throat, or other symptoms of a cold or flu. Do not treat yourself. Try to avoid being around people who are sick. If you have not had the measles or chickenpox vaccines, tell your care team right away if you are around someone with these viruses. Talk to your care team about your risk of cancer. You may be more at risk for certain types of cancers if you take this medication. This medication can affect the results of blood tests to match your blood type. Your care team will do blood tests to match your blood type before you start treatment. Tell your care team that you are being treated with this medication before receiving a blood transfusion. Do not become pregnant while taking this medicine or for at least 5 months after stopping it. Inform your doctor if  you wish to become pregnant or think you might be pregnant. There is a potential for serious side effects to an unborn child. Talk to your care team for more information. Do not breast-feed while taking this medicine. What side effects may I notice from receiving this medication? Side effects that you should report to your care team as soon as possible: Allergic reactions--skin rash, itching, hives, swelling of the face, lips, tongue, or throat Heart failure--shortness of breath, swelling of the ankles, feet, or hands, sudden weight gain, unusual weakness or fatigue Infection--fever, chills, cough, or sore throat Infusion reactions--chest pain, shortness of breath or trouble breathing, feeling faint or lightheaded Side effects that usually do not require medical attention (report these to your care team if they continue or are bothersome): Back pain Diarrhea Increase in blood pressure Trouble sleeping Vomiting This list may not describe all possible side effects. Call your doctor for medical advice about side effects. You may report side effects to FDA at 1-800-FDA-1088. Where should I keep my medication? This medication is given in a hospital or clinic. It will not be stored at home. NOTE: This sheet is a summary. It may not cover all possible information.  If you have questions about this medicine, talk to your doctor, pharmacist, or health care provider.  2024 Elsevier/Gold Standard (2021-06-12 00:00:00)  Carfilzomib  Injection What is this medication? CARFILZOMIB  (kar FILZ oh mib) treats multiple myeloma, a type of bone marrow cancer. It works by blocking a protein that causes cancer cells to grow and multiply. This helps to slow or stop the spread of cancer cells. This medicine may be used for other purposes; ask your health care provider or pharmacist if you have questions. COMMON BRAND NAME(S): KYPROLIS  What should I tell my care team before I take this medication? They need to know if  you have any of these conditions: Heart disease History of blood clots Irregular heartbeat Kidney disease Liver disease Lung or breathing disease An unusual or allergic reaction to carfilzomib , or other medications, foods, dyes, or preservatives If you or your partner are pregnant or trying to get pregnant Breastfeeding How should I use this medication? This medication is injected into a vein. It is given by your care team in a hospital or clinic setting. Talk to your care team about the use of this medication in children. Special care may be needed. Overdosage: If you think you have taken too much of this medicine contact a poison control center or emergency room at once. NOTE: This medicine is only for you. Do not share this medicine with others. What if I miss a dose? Keep appointments for follow-up doses. It is important not to miss your dose. Call your care team if you are unable to keep an appointment. What may interact with this medication? Interactions are not expected. This list may not describe all possible interactions. Give your health care provider a list of all the medicines, herbs, non-prescription drugs, or dietary supplements you use. Also tell them if you smoke, drink alcohol, or use illegal drugs. Some items may interact with your medicine. What should I watch for while using this medication? Your condition will be monitored carefully while you are receiving this medication. You may need blood work while taking this medication. Check with your care team if you have severe diarrhea, nausea, and vomiting, or if you sweat a lot. The loss of too much body fluid may make it dangerous for you to take this medication. This medication may affect your coordination, reaction time, or judgment. Do not drive or operate machinery until you know how this medication affects you. Sit up or stand slowly to reduce the risk of dizzy or fainting spells. Drinking alcohol with this medication can  increase the risk of these side effects. Talk to your care team if you may be pregnant. Serious birth defects can occur if you take this medication during pregnancy and for 6 months after the last dose. You will need a negative pregnancy test before starting this medication. Contraception is recommended while taking this medication and for 6 months after the last dose. Your care team can help you find an option that works for you. If your partner can get pregnant, use a condom during sex while taking this medication and for 3 months after the last dose. Do not breastfeed while taking this medication and for 2 weeks after the last dose. This medication may cause infertility. Talk to your care team if you are concerned about your fertility. What side effects may I notice from receiving this medication? Side effects that you should report to your care team as soon as possible: Allergic reactions--skin rash, itching, hives, swelling of the face,  lips, tongue, or throat Bleeding--bloody or black, tar-like stools, vomiting blood or brown material that looks like coffee grounds, red or dark brown urine, small red or purple spots on skin, unusual bruising or bleeding Blood clot--pain, swelling, or warmth in the leg, shortness of breath, chest pain Dizziness, loss of balance or coordination, confusion or trouble speaking Heart attack--pain or tightness in the chest, shoulders, arms, or jaw, nausea, shortness of breath, cold or clammy skin, feeling faint or lightheaded Heart failure--shortness of breath, swelling of the ankles, feet, or hands, sudden weight gain, unusual weakness or fatigue Heart rhythm changes--fast or irregular heartbeat, dizziness, feeling faint or lightheaded, chest pain, trouble breathing Increase in blood pressure Infection--fever, chills, cough, sore throat, wounds that don't heal, pain or trouble when passing urine, general feeling of discomfort or being unwell Infusion  reactions--chest pain, shortness of breath or trouble breathing, feeling faint or lightheaded Kidney injury--decrease in the amount of urine, swelling of the ankles, hands, or feet Liver injury--right upper belly pain, loss of appetite, nausea, light-colored stool, dark yellow or brown urine, yellowing skin or eyes, unusual weakness or fatigue Lung injury--shortness of breath or trouble breathing, cough, spitting up blood, chest pain, fever Pulmonary hypertension--shortness of breath, chest pain, fast or irregular heartbeat, feeling faint or lightheaded, fatigue, swelling of the ankles or feet Stomach pain, bloody diarrhea, pale skin, unusual weakness or fatigue, decrease in the amount of urine, which may be signs of hemolytic uremic syndrome Sudden and severe headache, confusion, change in vision, seizures, which may be signs of posterior reversible encephalopathy syndrome (PRES) TTP--purple spots on the skin or inside the mouth, pale skin, yellowing skin or eyes, unusual weakness or fatigue, fever, fast or irregular heartbeat, confusion, change in vision, trouble speaking, trouble walking Tumor lysis syndrome (TLS)--nausea, vomiting, diarrhea, decrease in the amount of urine, dark urine, unusual weakness or fatigue, confusion, muscle pain or cramps, fast or irregular heartbeat, joint pain Side effects that usually do not require medical attention (report to your care team if they continue or are bothersome): Diarrhea Fatigue Nausea Trouble sleeping This list may not describe all possible side effects. Call your doctor for medical advice about side effects. You may report side effects to FDA at 1-800-FDA-1088. Where should I keep my medication? This medication is given in a hospital or clinic. It will not be stored at home. NOTE: This sheet is a summary. It may not cover all possible information. If you have questions about this medicine, talk to your doctor, pharmacist, or health care provider.   2024 Elsevier/Gold Standard (2021-12-10 00:00:00)

## 2024-06-12 NOTE — Progress Notes (Signed)
 Pt in for follow up, reports increased appetite and weight gain.  Denies any concerns.

## 2024-06-12 NOTE — Progress Notes (Signed)
 [facility] OFFICE PROGRESS NOTE  Patient Care Team: [place] HERO, MD as PCP - General (Family Medicine) End, Lonni, MD as PCP - Cardiology (Cardiology) Guinevere File, [name] Rennie Cindy SAUNDERS, MD as Consulting Physician (Oncology) Parris Manna, MD as Consulting Physician (Pulmonary Disease) Damian Therisa HERO, MD as Consulting Physician (Endocrinology)   Cancer Staging  Multiple myeloma not having achieved remission [facility]) Staging form: Plasma Cell Myeloma and Plasma Cell Disorders, AJCC 8th Edition - Clinical: No stage assigned - Unsigned - Clinical: No stage assigned - Unsigned   Oncology History Overview Note  # SEP 2018- MULTIPLE MYELOMA IgALamda [2.5 gm/dl; K/L= 05/8700]; STAGE III [beta 2 microglobulin=5.5] [presented with acute renal failure; anemia; NO hypercalcemia; Skeletal survey-Normal]; BMBx- 45% plasma cells; FISH-POSITIVE 11:14 translocation.[STANDARD-high RISK]/cyto-Normal; SEP 2018- PET- L3 posterior element lesion.   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 No morphologic support for residual myeloma disease. Negative for minimal residual disease by MM-MRD flow cytometry; HOLD REVLIMID  [leg swelling]; AUG 2021-PET scan negative for myeloma; continue to hold Revlimid   # MARCH 2021-diastolic congestive heart failure [Dr.End]  # BMBx- OCT 2021- BMBx- 5% plasmacytosis-however this appears to be more polyclonal  rather than monoclonal-not explain patient worsening anemia.   # OCT 2022- 18th- Dara- Rev-Dex; MARCH, 2024- PROGRESSION bone marrow biopsy 70% plasma cells; PET scan no bone lesions-however conus medullaris uptake-MRI lumbar spine-   # MAY 2024- DISCONTINUE carfilzomib -dexamethasone - Pomalyst ; [rising M-protein; MAY 2024- 1.3. ]   # start 12/27/2022 -Isa-Carfil-Dex;  [decrease dexamethasone  20 weekly- starting 05/04/23- sec to glaucoma].   # OCT 24th, 2024- Bone marrow Biopsy: The findings are consistent with  focal minimal residual lambda restricted plasma cell neoplasm- HOLD treatment sec to glaucoma; and drop in EF AUG 2024- 45-50% [March 2024- 50-55%]  # MARCH 16th, 2025- re-started Isa-Carfil-Dex;  #November 2021-hyperthyroidism/goiter- [D.Bennett/Dr.Solum]-methimazole . S/p Thyroidectomy [Dr.Kim; [facility]-AUG 2022]  --------------------------  # 12/12- RIGHT JUGULAR DVT-x 19m on xarelto ; finished April 2020; September 2020-EGD/dysphagia; Dr. Therisa  # Acute renal failure [Dr.Singh; Proteinuria 1.5gm/day ]; acyclovir Mickeal ------------------------------------------------------------------------------------------------------------   DIAGNOSIS: [ ]  MULTIPLE MYELOMA  STAGE: III/HIGH RISK ;GOALS: CONTROL      Multiple myeloma not having achieved remission (HCC)  11/21/2017 - 04/28/2018 Chemotherapy   Patient is on Treatment Plan : MYELOMA SALVAGE Cyclophosphamide  / Carfilzomib  / Dexamethasone  (CCd) q28d     05/12/2021 - 02/25/2022 Chemotherapy   Patient is on Treatment Plan : MYELOMA RELAPSED REFRACTORY Daratumumab  SQ + Lenalidomide  + Dexamethasone  (DaraRd) q28d     05/12/2021 - 07/23/2022 Chemotherapy   Patient is on Treatment Plan : MYELOMA RELAPSED REFRACTORY Daratumumab  SQ + Lenalidomide  + Dexamethasone  (DaraRd) q28d     11/02/2022 - 11/16/2022 Chemotherapy   Patient is on Treatment Plan : MYELOMA RELAPSED/ REFRACTORY Carfilzomib  D1,8,15 (20/27) + Pomalidomide  + Dexamethasone  (KPd) q28d      12/27/2022 -  Chemotherapy   Patient is on Treatment Plan : MYELOMA Carfilzomib  + Dexamethasone  + Isatuximab  q28d      INTERVAL HISTORY: Ambulating  independently.  Alone.  Brandi  E [name] 73 y.o.  female pleasant patient above history of relapsed multiple myeloma--currently re-started back after 3 months break [JULY 28th, 2025]  Isa- Carfil-Dex  is here for a follow up.   Discussed the use of AI scribe software for clinical note transcription with the patient, who gave verbal consent to proceed.  History of Present Illness   Brandi [name] is a 73 year old female with multiple myeloma who presents for a follow-up.  She has been undergoing chemotherapy for multiple myeloma since June. Initially, there was an improvement in cancer markers, but a subsequent increase was noted after an infection and treatment hold. Her chemotherapy treatments were previously adjusted to accommodate recent eye surgery.  She recently underwent eye surgery and notes some improvement in her vision, with expectations for further improvement.  She has a stable appetite and has gained two pounds, currently weighing 157 pounds. No difficulty breathing or chest pain.  She has a history of thyroid  issues, with fluctuating thyroid  levels despite taking her medication as prescribed. She takes her thyroid  medication first thing in the morning before other medications.  She has nine grandchildren and nine great-grandchildren. She recently welcomed a new great-grandchild, whom she helps care for during the day.      Review of Systems  Constitutional:  Positive for malaise/fatigue. Negative for chills, diaphoresis and fever.  HENT:  Negative for nosebleeds and sore throat.   Eyes:  Negative for double vision.  Respiratory:  Negative for hemoptysis.   Cardiovascular:  Negative for chest pain, palpitations and orthopnea.  Gastrointestinal:  Negative for abdominal pain, blood in stool, constipation, heartburn and  melena.  Genitourinary:  Negative for dysuria, frequency and urgency.  Musculoskeletal:  Positive for back pain.  Skin: Negative.  Negative for itching and rash.  Neurological:  Negative for dizziness, tingling, focal weakness, weakness and headaches.  Endo/Heme/Allergies:  Does not bruise/bleed easily.  Psychiatric/Behavioral:  Negative for depression. The patient is not nervous/anxious.     PAST MEDICAL HISTORY :  Past Medical History:  Diagnosis Date   (HFpEF) heart failure with preserved ejection fraction (HCC)    a. 06/2020 Echo: EF 60-65%, no rwma, nl RV size/fxn. Triv MR. Triv TR/AI. Mod elev PASP.   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   Open wound of left thigh 12/15/2023   Pneumonia    Red blood cell antibody positive, compatible PRBC difficult to obtain    S/P autologous bone marrow transplantation (HCC)    Sepsis (HCC) 11/25/2023    PAST SURGICAL HISTORY :   Past Surgical History:  Procedure Laterality Date   ANTERIOR VITRECTOMY Left 10/07/2021   Procedure: ANTERIOR VITRECTOMY;  Surgeon: Mittie Gaskin, MD;  Location: [facility];  Service: Ophthalmology;  Laterality: Left;   CATARACT EXTRACTION W/PHACO Left 10/07/2021   Procedure: CATARACT EXTRACTION PHACO AND INTRAOCULAR LENS PLACEMENT (IOC) LEFT VISION BLUE;  Surgeon: Mittie Gaskin, MD;  Location: [facility];  Service: Ophthalmology;  Laterality: Left;  kit for manual incision 14.56 01:21.4   CATARACT EXTRACTION W/PHACO Right 10/21/2021   Procedure: CATARACT EXTRACTION PHACO AND INTRAOCULAR LENS PLACEMENT (IOC) RIGHT 11.12 01:48.1;  Surgeon: Mittie Gaskin, MD;  Location: [facility];  Service:  Ophthalmology;  Laterality: Right;   ESOPHAGOGASTRODUODENOSCOPY (EGD) WITH PROPOFOL  N/A 03/30/2019   Procedure: ESOPHAGOGASTRODUODENOSCOPY (EGD) WITH PROPOFOL ;  Surgeon: Therisa Bi, MD;  Location: [facility];  Service: Gastroenterology;  Laterality: N/A;   INCISION AND DRAINAGE ABSCESS Left 12/01/2023   Procedure: INCISION AND DRAINAGE, ABSCESS;  Surgeon: Lane Shope, MD;  Location: [facility];  Service: General;  Laterality: Left;   IR BONE MARROW BIOPSY & ASPIRATION  05/25/2023   IR FLUORO GUIDE PORT INSERTION RIGHT  12/16/2017   IR IMAGING GUIDED PORT INSERTION  06/15/2021   KNEE ARTHROSCOPY Left 09/03/2022   Procedure: ARTHROSCOPY KNEE DEBRIDEMENT;  Surgeon: Leora Lynwood SAUNDERS, MD;  Location: [facility];  Service: Orthopedics;  Laterality: Left;   THYROIDECTOMY N/A     FAMILY HISTORY :   Family History  Problem Relation Age of Onset   Pneumonia Mother    Seizures Father     SOCIAL HISTORY:   Social History   Tobacco Use   Smoking status: Former    Current packs/day: 0.00    Types: Cigarettes    Quit date: 03/18/2021    Years since quitting: 3.2   Smokeless tobacco: Never   Tobacco comments:    2 cigarettes QOD  Vaping Use   Vaping status: Never Used  Substance Use Topics   Alcohol use: No   Drug use: No    ALLERGIES:  has no known allergies.  MEDICATIONS:  Current Outpatient Medications  Medication Sig Dispense Refill   acyclovir  (ZOVIRAX ) 400 MG tablet Take 1 tablet (400 mg total) by mouth 2 (two) times daily. 120 tablet 2   albuterol  (VENTOLIN  HFA) 108 (90 Base) MCG/ACT inhaler Inhale 2 puffs into the lungs every 6 (six) hours as needed for wheezing or shortness of breath. 8 g 3   aspirin  EC 81 MG EC tablet Take 1 tablet (81 mg total) by mouth daily. 90 tablet 3   brimonidine -timolol  (COMBIGAN ) 0.2-0.5 % ophthalmic solution Apply 1 drop to eye 2 (two) times daily.     Brinzolamide -Brimonidine  (SIMBRINZA) 1-0.2 % SUSP Apply 1 drop to eye in the morning and at  bedtime. I drop both eyes twice a day     Calcium  Carb-Cholecalciferol 600-10 MG-MCG TABS Take 1 tablet by mouth daily.     calcium  carbonate (CALCIUM  600 HIGH POTENCY) 600 MG TABS tablet Take 2 tablets (1,200 mg total) by mouth 2 (two) times daily. 120 tablet 2   dorzolamide (TRUSOPT) 2 % ophthalmic solution 1 drop 2 (two) times daily.     furosemide  (LASIX ) 40 MG tablet Take 1 tablet (40 mg total) by mouth daily as needed for edema or fluid.     lidocaine -prilocaine  (EMLA ) cream Apply 1 application topically as needed. Apply to port and cover with saran wrap 1-2 hours prior to port access 30 g 1   magnesium  oxide (MAG-OX) 400 (240 Mg) MG tablet Take 1 tablet (400 mg total) by mouth 2 (two) times daily. 90 tablet 1   montelukast  (SINGULAIR ) 10 MG tablet Take 1 tablet (10 mg total) by mouth at bedtime. 30 tablet 3   NIFEdipine (ADALAT CC) 30 MG 24 hr tablet Take 30 mg by mouth daily.     pantoprazole  (PROTONIX ) 40 MG tablet Take 1 tablet (40 mg total) by mouth daily. 90 tablet 0   polyethylene glycol (MIRALAX  / GLYCOLAX ) packet Take 17 g by mouth daily as needed for mild constipation. 14 each 0   spironolactone  (ALDACTONE ) 25 MG tablet Take 1 tablet (25 mg total) by  mouth daily as needed (edema). 90 tablet 1   traMADol  (ULTRAM ) 50 MG tablet Take 1 tablet (50 mg total) by mouth every 12 (twelve) hours as needed. 60 tablet 0   Vitamin D , Ergocalciferol , (DRISDOL ) 1.25 MG (50000 UNIT) CAPS capsule Take 1 capsule (50,000 Units total) by mouth once a week. 12 capsule 0   levothyroxine  (SYNTHROID ) 150 MCG tablet Take 1 tablet (150 mcg total) by mouth daily. For thyroid  30 tablet 1   No current facility-administered medications for this visit.   Facility-Administered Medications Ordered in Other Visits  Medication Dose Route Frequency Provider Last Rate Last Admin   0.9 %  sodium chloride  infusion   Intravenous Once Dorleen Kissel R, MD        PHYSICAL EXAMINATION:   BP 122/80 (BP Location:  Left Arm, Patient Position: Sitting)   Pulse 67   Temp 97.6 F (36.4 C) (Tympanic)   Resp 16   Ht 5' 2 (1.575 m)   Wt 157 lb 8 oz (71.4 kg)   SpO2 98%   BMI 28.81 kg/m   Filed Weights   06/12/24 0916  Weight: 157 lb 8 oz (71.4 kg)         Mild leg swelling.   Physical Exam HENT:     Head: Normocephalic and atraumatic.  Eyes:     Pupils: Pupils are equal, round, and reactive to light.  Cardiovascular:     Rate and Rhythm: Normal rate and regular rhythm.  Pulmonary:     Effort: Pulmonary effort is normal. No respiratory distress.     Breath sounds: Normal breath sounds. No wheezing.  Abdominal:     General: Bowel sounds are normal. There is no distension.     Palpations: Abdomen is soft. There is no mass.     Tenderness: There is no abdominal tenderness. There is no guarding or rebound.  Musculoskeletal:        General: No tenderness. Normal range of motion.     Cervical back: Normal range of motion and neck supple.  Skin:    General: Skin is warm.  Neurological:     Mental Status: She is alert and oriented to person, place, and time.  Psychiatric:        Mood and Affect: Affect normal.    LABORATORY DATA:  I have reviewed the data as listed    Component Value Date/Time   NA 140 06/12/2024 0850   NA 141 01/25/2020 1530   K 3.6 06/12/2024 0850   CL 109 06/12/2024 0850   CO2 21 (L) 06/12/2024 0850   GLUCOSE 99 06/12/2024 0850   BUN 15 06/12/2024 0850   BUN 19 01/25/2020 1530   CREATININE 0.62 06/12/2024 0850   CALCIUM  8.7 (L) 06/12/2024 0850   PROT 7.3 06/12/2024 0850   ALBUMIN 4.0 06/12/2024 0850   AST 16 06/12/2024 0850   ALT 11 06/12/2024 0850   ALKPHOS 64 06/12/2024 0850   BILITOT 0.6 06/12/2024 0850   GFRNONAA >60 06/12/2024 0850   GFRAA NOT CALCULATED 04/17/2020 1352    No results found for: SPEP, UPEP  Lab Results  Component Value Date   WBC 3.3 (L) 06/12/2024   NEUTROABS 1.0 (L) 06/12/2024   HGB 11.5 (L) 06/12/2024   HCT 35.7 (L)  06/12/2024   MCV 97.3 06/12/2024   PLT 247 06/12/2024      Chemistry      Component Value Date/Time   NA 140 06/12/2024 0850   NA 141 01/25/2020 1530  K 3.6 06/12/2024 0850   CL 109 06/12/2024 0850   CO2 21 (L) 06/12/2024 0850   BUN 15 06/12/2024 0850   BUN 19 01/25/2020 1530   CREATININE 0.62 06/12/2024 0850      Component Value Date/Time   CALCIUM  8.7 (L) 06/12/2024 0850   ALKPHOS 64 06/12/2024 0850   AST 16 06/12/2024 0850   ALT 11 06/12/2024 0850   BILITOT 0.6 06/12/2024 0850      (H): Data is abnormally high   Latest Reference Range & Units Most Recent 12/24/20 14:01 04/01/21 14:34 04/28/21 09:17 06/11/21 09:42 07/03/21 10:32 08/20/21 10:06  M Protein SerPl Elph-Mcnc Not Observed g/dL 1.1 (H) (C) 8/73/76 89:93 Not Observed (C) 0.6 (H) (C) 0.7 (H) (C) 0.5 (H) (C) 0.7 (H) (C) 1.1 (H) (C)    Latest Reference Range & Units Most Recent 09/17/21 08:47 10/15/21 08:25 11/12/21 08:55  Kappa free light chain 3.3 - 19.4 mg/L 5.0 11/12/21 08:55 7.2 7.2 5.0  Lambda free light chains 5.7 - 26.3 mg/L 223.6 (H) 11/12/21 08:55 215.3 (H) 340.2 (H) 223.6 (H)  Kappa, lambda light chain ratio 0.26 - 1.65  0.02 (L) 11/12/21 08:55 0.03 (L) 0.02 (L) 0.02 (L)  (H): Data is abnormally high (L): Data is abnormally low  RADIOGRAPHIC STUDIES: I have personally reviewed the radiological images as listed and agreed with the findings in the report. No results found.    ASSESSMENT & PLAN:  Multiple myeloma not having achieved remission (HCC) # RECURRENT Multiple myeloma stage III [high-risk cytogenetics-status post KRD-VGPR ; status post autologous stem cell transplant on 06/15/18.   MAY 2024- 1.3. ]  currently on Isa-Dex; d-2 carfil weekly q 28 days. # MARCH 2025- M-protein -0.3 ; kappa lambda light chain ratio-1.8 lambda light chain 63-slowly RISING FEB 2025-chemotherapy was interrupted-until MAY 2025- because of thigh infection needing wound VAC.    # S/p re-evaluation at [facility] in June 2025-  Labs c/w early progression. No evidence of end organ damage-  PET SEP 2025-  No findings of active myeloma.  low-grade activity in a 1.5 cm partially sclerotic lesion in the right distal femoral metaphysis, probably an enchondroma or similar incidental benign lesion. The appearance is not characteristic for myeloma.   # GIVEN PET NEGATIVITY- For now continue with with  Isa-Dex-Carfil- Labs-CBC/chemistries were reviewed with the patient. OCT 2025- MM/K-l light chain- RISING.  Will monitor closely-and repeat PET scan/bone marrow biopsy to confirm progression.  If clinical progression noted would recommend evaluation again at [facility] for CAR-T/bispecific's  # DROP in EF:  MARCH 2024-Echo [[facility]] Left ventricular EF 50 to 55%;  ECHO  04/23/2024 50-55%- stable.  Will repeat again in dec-jan 2025.   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    # Anemia- JAn 2025- I ron sat- 8%; ferritin- 19 on prn venofer - stable.    # Electrolyte: Hypokalemia: mild-  continue compliance Kdur TID-; hypocalcemia- 8.0-   stable.    # Iatrogenic hypothyroidism [ Graves/goiter s/p total thyroidectomy;aug,2022 ]-currently on Synthroid  175 mcg/day-; will decrease the dose to 150 -  TSH < 0.01- will repeat  # MAY 2025- thigh infection s/p wound VAC [Dr.Rodenberg]  prn  IVIG prn/monthly- SEP Ig G > 700- monitor for now.     # Glaucoma BIL- L > R [Dr.King; [facility]] - ? Steroid induced on eye drops. decreased the steroids. Discussed with Dr.Haines, ophthalmology- Glaucoma under control- awaiting surgery for retinal detachment-on 11/12. Stable.   #  chronic  Lower back discomfort - Continue tramadol - refilled . Stable.   # Bone lesions /last zometa  on 11/27/2017.  S/p   Dental extraction- on dentures-  PET scan- NO evidence of any bone lesions. ? Zometa   given risk of ONJ.   # Infection prophylaxis: Acyclovir ; asprin- add IVIG infusion [IgG < 400]- plan  IVIG- q 4 w #1 on-  9/17- Stable.   # IV access: mediport- functional  # ACP: Patient has  incurable cancer.  However patient is currently doing well. Patient interested in continue current scope of therapy; and full code.  6 w- given dental extrac- on 1/26. :MM panel; K/l light chain ratio q 4 W  ; NO zometa - dental issues-ok; but hypocalemia  Starting aug 25th- q2w-MD: D-isa-dex;car1&2-car; weekly x 3; week 4-OFF;  q 28 day cycle- pushed off to 6 weeks- 11/04 PS:  # DISPOSITION: # chemo today; and tomorrow as planned # Order Thyroid  panel in 8 weeks # order standing orders for 4 weeks- MM panel/k-l light chains- # follow up as per IS-  -Dr.B   Orders Placed This Encounter  Procedures   CBC with Differential (Cancer Center Only)    Standing Status:   Future    Expected Date:   07/24/2024    Expiration Date:   07/24/2025   CMP (Cancer Center only)    Standing Status:   Future    Expected Date:   07/24/2024    Expiration Date:   07/24/2025   CBC with Differential (Cancer Center Only)    Standing Status:   Future    Expected Date:   07/31/2024    Expiration Date:   07/31/2025   CMP (Cancer Center only)    Standing Status:   Future    Expected Date:   07/31/2024    Expiration Date:   07/31/2025   CBC with Differential (Cancer Center Only)    Standing Status:   Future    Expected Date:   08/07/2024    Expiration Date:   08/07/2025   CMP (Cancer Center only)    Standing Status:   Future    Expected Date:   08/07/2024    Expiration Date:   08/07/2025   Thyroid  Panel With TSH    Standing Status:   Future    Expected Date:   08/07/2024    Expiration Date:   06/12/2025   All questions were answered. The patient knows to call the clinic with any problems, questions or concerns.      Cindy JONELLE Joe, MD 06/12/2024 2:05 PM End

## 2024-06-13 ENCOUNTER — Inpatient Hospital Stay

## 2024-06-13 VITALS — BP 100/58 | HR 64 | Temp 98.1°F | Resp 17

## 2024-06-13 DIAGNOSIS — C9000 Multiple myeloma not having achieved remission: Secondary | ICD-10-CM

## 2024-06-13 DIAGNOSIS — Z5112 Encounter for antineoplastic immunotherapy: Secondary | ICD-10-CM | POA: Diagnosis not present

## 2024-06-13 LAB — KAPPA/LAMBDA LIGHT CHAINS
Kappa free light chain: 1.4 mg/L — ABNORMAL LOW (ref 3.3–19.4)
Kappa, lambda light chain ratio: 0.01 — ABNORMAL LOW (ref 0.26–1.65)
Lambda free light chains: 115 mg/L — ABNORMAL HIGH (ref 5.7–26.3)

## 2024-06-13 MED ORDER — SODIUM CHLORIDE 0.9 % IV SOLN
Freq: Once | INTRAVENOUS | Status: AC
  Filled 2024-06-13: qty 250

## 2024-06-13 MED ORDER — DEXTROSE 5 % IV SOLN
56.0000 mg/m2 | Freq: Once | INTRAVENOUS | Status: AC
  Administered 2024-06-13: 100 mg via INTRAVENOUS
  Filled 2024-06-13: qty 30

## 2024-06-13 MED ORDER — DEXAMETHASONE SOD PHOSPHATE PF 10 MG/ML IJ SOLN
5.0000 mg | Freq: Once | INTRAMUSCULAR | Status: AC
  Administered 2024-06-13: 5 mg via INTRAVENOUS

## 2024-06-14 ENCOUNTER — Other Ambulatory Visit: Payer: Self-pay

## 2024-06-15 LAB — MULTIPLE MYELOMA PANEL, SERUM
Albumin SerPl Elph-Mcnc: 3.7 g/dL (ref 2.9–4.4)
Albumin/Glob SerPl: 1.4 (ref 0.7–1.7)
Alpha 1: 0.1 g/dL (ref 0.0–0.4)
Alpha2 Glob SerPl Elph-Mcnc: 0.7 g/dL (ref 0.4–1.0)
B-Globulin SerPl Elph-Mcnc: 1.2 g/dL (ref 0.7–1.3)
Gamma Glob SerPl Elph-Mcnc: 0.8 g/dL (ref 0.4–1.8)
Globulin, Total: 2.8 g/dL (ref 2.2–3.9)
IgA: 365 mg/dL (ref 64–422)
IgG (Immunoglobin G), Serum: 599 mg/dL (ref 586–1602)
IgM (Immunoglobulin M), Srm: <5 mg/dL — ABNORMAL LOW (ref 26–217)
M Protein SerPl Elph-Mcnc: 0.4 g/dL — ABNORMAL HIGH
Total Protein ELP: 6.5 g/dL (ref 6.0–8.5)

## 2024-06-25 ENCOUNTER — Encounter: Payer: Self-pay | Admitting: *Deleted

## 2024-06-26 ENCOUNTER — Inpatient Hospital Stay (HOSPITAL_BASED_OUTPATIENT_CLINIC_OR_DEPARTMENT_OTHER): Admitting: Internal Medicine

## 2024-06-26 ENCOUNTER — Inpatient Hospital Stay: Attending: Internal Medicine

## 2024-06-26 ENCOUNTER — Inpatient Hospital Stay

## 2024-06-26 ENCOUNTER — Encounter: Payer: Self-pay | Admitting: Internal Medicine

## 2024-06-26 VITALS — BP 130/73 | HR 68

## 2024-06-26 VITALS — BP 122/66 | HR 69 | Temp 98.6°F | Resp 16 | Ht 62.0 in | Wt 161.0 lb

## 2024-06-26 DIAGNOSIS — C9000 Multiple myeloma not having achieved remission: Secondary | ICD-10-CM

## 2024-06-26 DIAGNOSIS — C9002 Multiple myeloma in relapse: Secondary | ICD-10-CM | POA: Insufficient documentation

## 2024-06-26 DIAGNOSIS — Z79620 Long term (current) use of immunosuppressive biologic: Secondary | ICD-10-CM | POA: Diagnosis not present

## 2024-06-26 DIAGNOSIS — Z87891 Personal history of nicotine dependence: Secondary | ICD-10-CM | POA: Diagnosis not present

## 2024-06-26 DIAGNOSIS — Z9221 Personal history of antineoplastic chemotherapy: Secondary | ICD-10-CM

## 2024-06-26 DIAGNOSIS — Z5112 Encounter for antineoplastic immunotherapy: Secondary | ICD-10-CM | POA: Insufficient documentation

## 2024-06-26 DIAGNOSIS — Z09 Encounter for follow-up examination after completed treatment for conditions other than malignant neoplasm: Secondary | ICD-10-CM

## 2024-06-26 LAB — CMP (CANCER CENTER ONLY)
ALT: 11 U/L (ref 0–44)
AST: 16 U/L (ref 15–41)
Albumin: 3.9 g/dL (ref 3.5–5.0)
Alkaline Phosphatase: 58 U/L (ref 38–126)
Anion gap: 8 (ref 5–15)
BUN: 18 mg/dL (ref 8–23)
CO2: 22 mmol/L (ref 22–32)
Calcium: 8.1 mg/dL — ABNORMAL LOW (ref 8.9–10.3)
Chloride: 111 mmol/L (ref 98–111)
Creatinine: 0.71 mg/dL (ref 0.44–1.00)
GFR, Estimated: >60 mL/min (ref >60)
Glucose, Bld: 97 mg/dL (ref 70–99)
Potassium: 3.3 mmol/L — ABNORMAL LOW (ref 3.5–5.1)
Sodium: 141 mmol/L (ref 135–145)
Total Bilirubin: 0.7 mg/dL (ref 0.0–1.2)
Total Protein: 7.2 g/dL (ref 6.5–8.1)

## 2024-06-26 LAB — CBC WITH DIFFERENTIAL (CANCER CENTER ONLY)
Abs Immature Granulocytes: 0.01 K/uL (ref 0.00–0.07)
Basophils Absolute: 0 K/uL (ref 0.0–0.1)
Basophils Relative: 1 %
Eosinophils Absolute: 0.1 K/uL (ref 0.0–0.5)
Eosinophils Relative: 2 %
HCT: 35.5 % — ABNORMAL LOW (ref 36.0–46.0)
Hemoglobin: 11.7 g/dL — ABNORMAL LOW (ref 12.0–15.0)
Immature Granulocytes: 0 %
Lymphocytes Relative: 55 %
Lymphs Abs: 2 K/uL (ref 0.7–4.0)
MCH: 31.4 pg (ref 26.0–34.0)
MCHC: 33 g/dL (ref 30.0–36.0)
MCV: 95.2 fL (ref 80.0–100.0)
Monocytes Absolute: 0.4 K/uL (ref 0.1–1.0)
Monocytes Relative: 10 %
Neutro Abs: 1.2 K/uL — ABNORMAL LOW (ref 1.7–7.7)
Neutrophils Relative %: 32 %
Platelet Count: 191 K/uL (ref 150–400)
RBC: 3.73 MIL/uL — ABNORMAL LOW (ref 3.87–5.11)
RDW: 13.6 % (ref 11.5–15.5)
WBC Count: 3.7 K/uL — ABNORMAL LOW (ref 4.0–10.5)
nRBC: 0 % (ref 0.0–0.2)

## 2024-06-26 MED ORDER — DEXAMETHASONE SOD PHOSPHATE PF 10 MG/ML IJ SOLN
5.0000 mg | Freq: Once | INTRAMUSCULAR | Status: AC
  Administered 2024-06-26: 5 mg via INTRAVENOUS

## 2024-06-26 MED ORDER — SODIUM CHLORIDE 0.9 % IV SOLN
Freq: Once | INTRAVENOUS | Status: AC
  Filled 2024-06-26: qty 250

## 2024-06-26 MED ORDER — ACETAMINOPHEN 325 MG PO TABS
650.0000 mg | ORAL_TABLET | Freq: Once | ORAL | Status: AC
  Administered 2024-06-26: 650 mg via ORAL
  Filled 2024-06-26: qty 2

## 2024-06-26 MED ORDER — FUROSEMIDE 40 MG PO TABS: 40.0000 mg | ORAL_TABLET | Freq: Every day | ORAL | 1 refills | Status: AC | PRN

## 2024-06-26 MED ORDER — SODIUM CHLORIDE 0.9 % IV SOLN
10.0000 mg/kg | Freq: Once | INTRAVENOUS | Status: AC
  Administered 2024-06-26: 700 mg via INTRAVENOUS
  Filled 2024-06-26: qty 25

## 2024-06-26 MED ORDER — DIPHENHYDRAMINE HCL 50 MG/ML IJ SOLN
50.0000 mg | Freq: Once | INTRAMUSCULAR | Status: AC
  Administered 2024-06-26: 50 mg via INTRAVENOUS
  Filled 2024-06-26: qty 1

## 2024-06-26 MED ORDER — DEXTROSE 5 % IV SOLN
56.0000 mg/m2 | Freq: Once | INTRAVENOUS | Status: AC
  Administered 2024-06-26: 100 mg via INTRAVENOUS
  Filled 2024-06-26: qty 30

## 2024-06-26 MED ORDER — FAMOTIDINE IN NACL 20-0.9 MG/50ML-% IV SOLN
20.0000 mg | Freq: Once | INTRAVENOUS | Status: AC
  Administered 2024-06-26: 20 mg via INTRAVENOUS
  Filled 2024-06-26: qty 50

## 2024-06-26 NOTE — Progress Notes (Signed)
 Needs refill of lasix , pended

## 2024-06-26 NOTE — Progress Notes (Signed)
 [name]  Patient Care Team: Buren Rock HERO, MD as PCP - General (Family Medicine) End, Lonni, MD as PCP - Cardiology (Cardiology) Guinevere File, [name] Rennie Cindy SAUNDERS, MD as Consulting Physician (Oncology) Parris Manna, MD as Consulting Physician (Pulmonary Disease) Damian Therisa HERO, MD as Consulting Physician (Endocrinology)   Cancer Staging  Multiple myeloma not having achieved remission [facility]) Staging form: Plasma Cell Myeloma and Plasma Cell Disorders, AJCC 8th Edition - Clinical: No stage assigned - Unsigned - Clinical: No stage assigned - Unsigned   Oncology History Overview Note  # SEP 2018- MULTIPLE MYELOMA IgALamda [2.5 gm/dl; K/L= 05/8700]; STAGE III [beta 2 microglobulin=5.5] [presented with acute renal failure; anemia; NO hypercalcemia; Skeletal survey-Normal]; BMBx- 45% plasma cells; FISH-POSITIVE 11:14 translocation.[STANDARD-high RISK]/cyto-Normal; SEP 2018- PET- L3 posterior element lesion.   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 No morphologic support for residual myeloma disease. Negative for minimal residual disease by MM-MRD flow cytometry; HOLD REVLIMID  [leg swelling]; AUG 2021-PET scan negative for myeloma; continue to hold Revlimid   # MARCH 2021-diastolic congestive heart failure [Dr.End]  # BMBx- OCT 2021- BMBx- 5% plasmacytosis-however this appears to be more polyclonal  rather than monoclonal-not explain patient worsening anemia.   # OCT 2022- 18th- Dara- Rev-Dex; MARCH, 2024- PROGRESSION bone marrow biopsy 70% plasma cells; PET scan no bone lesions-however conus medullaris uptake-MRI lumbar spine-   # MAY 2024- DISCONTINUE carfilzomib -dexamethasone - Pomalyst ; [rising M-protein; MAY 2024- 1.3. ]   # start 12/27/2022 -Isa-Carfil-Dex;  [decrease dexamethasone  20 weekly- starting 05/04/23- sec to glaucoma].   # OCT 24th, 2024- Bone marrow Biopsy: The findings are consistent with  focal minimal residual lambda restricted plasma cell neoplasm- HOLD treatment sec to glaucoma; and drop in EF AUG 2024- 45-50% [March 2024- 50-55%]  # MARCH 16th, 2025- re-started Isa-Carfil-Dex;  #November 2021-hyperthyroidism/goiter- [D.Bennett/Dr.Solum]-methimazole . S/p Thyroidectomy [Dr.Kim; [facility]-AUG 2022]  --------------------------  # 12/12- RIGHT JUGULAR DVT-x 12m on xarelto ; finished April 2020; September 2020-EGD/dysphagia; Dr. Therisa  # Acute renal failure [Dr.Singh; Proteinuria 1.5gm/day ]; acyclovir Mickeal ------------------------------------------------------------------------------------------------------------   DIAGNOSIS: [ ]  MULTIPLE MYELOMA  STAGE: III/HIGH RISK ;GOALS: CONTROL      Multiple myeloma not having achieved remission (HCC)  11/21/2017 - 04/28/2018 Chemotherapy   Patient is on Treatment Plan : MYELOMA SALVAGE Cyclophosphamide  / Carfilzomib  / Dexamethasone  (CCd) q28d     05/12/2021 - 02/25/2022 Chemotherapy   Patient is on Treatment Plan : MYELOMA RELAPSED REFRACTORY Daratumumab  SQ + Lenalidomide  + Dexamethasone  (DaraRd) q28d     05/12/2021 - 07/23/2022 Chemotherapy   Patient is on Treatment Plan : MYELOMA RELAPSED REFRACTORY Daratumumab  SQ + Lenalidomide  + Dexamethasone  (DaraRd) q28d     11/02/2022 - 11/16/2022 Chemotherapy   Patient is on Treatment Plan : MYELOMA RELAPSED/ REFRACTORY Carfilzomib  D1,8,15 (20/27) + Pomalidomide  + Dexamethasone  (KPd) q28d      12/27/2022 -  Chemotherapy   Patient is on Treatment Plan : MYELOMA Carfilzomib  + Dexamethasone  + Isatuximab  q28d      INTERVAL HISTORY: Ambulating  independently.  Alone.  Brandi  E [name] 73 y.o.  female pleasant patient above history of relapsed multiple myeloma--currently re-started back after 3 months break [JULY 28th, 2025]  Isa- Carfil-Dex  is here for a follow up.   Discussed the use of AI scribe software for clinical note transcription with the patient, who gave verbal consent to proceed.  History of Present Illness     Brandi [name] is a 73 year old female with multiple myeloma who presents for follow-up while on chemotherapy.  She is currently undergoing chemotherapy for multiple myeloma. Previous PET scans in July and September did not show any cancer. Her cancer markers went down for a while and then started to increase again. She has undergone multiple treatments in the past, which have provided temporary relief.  She last consulted a specialist at [facility] in June and has not yet scheduled a follow-up appointment.  Her current medication regimen includes Lasix , which she has recently refilled.  She reports improvement in neuropathy, no recent infections, stable eye condition, and no breathing issues.      Review of Systems  Constitutional:  Positive for malaise/fatigue. Negative for chills, diaphoresis and fever.  HENT:  Negative for nosebleeds and sore throat.   Eyes:  Negative for double vision.  Respiratory:  Negative for hemoptysis.   Cardiovascular:  Negative for chest pain, palpitations and orthopnea.  Gastrointestinal:  Negative for abdominal pain, blood in stool, constipation, heartburn and melena.  Genitourinary:  Negative for dysuria, frequency and urgency.  Musculoskeletal:  Positive for back pain.  Skin: Negative.  Negative for itching and rash.  Neurological:  Negative for dizziness, tingling, focal weakness, weakness and headaches.   Endo/Heme/Allergies:  Does not bruise/bleed easily.  Psychiatric/Behavioral:  Negative for depression. The patient is not nervous/anxious.     PAST MEDICAL HISTORY :  Past Medical History:  Diagnosis Date   (HFpEF) heart failure with preserved ejection fraction (HCC)    a. 06/2020 Echo: EF 60-65%, no rwma, nl RV size/fxn. Triv MR. Triv TR/AI. Mod elev PASP.   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   Open wound of left thigh 12/15/2023   Pneumonia    Red blood cell antibody positive, compatible PRBC difficult to obtain    S/P autologous bone marrow transplantation (HCC)    Sepsis (HCC) 11/25/2023    PAST SURGICAL HISTORY :   Past Surgical History:  Procedure Laterality Date   ANTERIOR VITRECTOMY Left 10/07/2021   Procedure: ANTERIOR VITRECTOMY;  Surgeon: Mittie Gaskin, MD;  Location: [facility];  Service: Ophthalmology;  Laterality: Left;   CATARACT EXTRACTION W/PHACO Left 10/07/2021   Procedure: CATARACT EXTRACTION PHACO AND INTRAOCULAR LENS PLACEMENT (IOC) LEFT VISION BLUE;  Surgeon: Mittie Gaskin, MD;  Location: [facility];  Service: Ophthalmology;  Laterality: Left;  kit for manual incision 14.56 01:21.4   CATARACT EXTRACTION W/PHACO Right 10/21/2021   Procedure: CATARACT EXTRACTION PHACO AND INTRAOCULAR LENS PLACEMENT (IOC) RIGHT 11.12 01:48.1;  Surgeon: Mittie Gaskin, MD;  Location: [facility];  Service: Ophthalmology;  Laterality: Right;   ESOPHAGOGASTRODUODENOSCOPY (EGD) WITH PROPOFOL  N/A 03/30/2019   Procedure: ESOPHAGOGASTRODUODENOSCOPY (EGD) WITH PROPOFOL ;  Surgeon: Therisa Bi, MD;  Location: [facility];  Service: Gastroenterology;  Laterality: N/A;  INCISION AND DRAINAGE ABSCESS Left 12/01/2023   Procedure: INCISION AND DRAINAGE, ABSCESS;  Surgeon: Lane Shope, MD;  Location: [facility] OR[facility];  Service: General;  Laterality: Left;   IR BONE MARROW BIOPSY & ASPIRATION  05/25/2023   IR FLUORO GUIDE PORT INSERTION RIGHT  12/16/2017   IR IMAGING GUIDED PORT INSERTION  06/15/2021   KNEE ARTHROSCOPY Left 09/03/2022   Procedure: ARTHROSCOPY KNEE DEBRIDEMENT;  Surgeon: Leora Lynwood SAUNDERS, MD;  Location: [facility] ORS;  Service: Orthopedics;  Laterality: Left;   THYROIDECTOMY N/A     FAMILY HISTORY :   Family History  Problem Relation Age of Onset   Pneumonia Mother    Seizures Father     SOCIAL HISTORY:   Social History   Tobacco Use   Smoking status: Former    Current packs/day: 0.00    Types: Cigarettes    Quit date: 03/18/2021    Years since quitting: 3.2   Smokeless tobacco: Never   Tobacco comments:    2 cigarettes QOD  Vaping Use   Vaping status: Never Used  Substance Use Topics   Alcohol use: No   Drug use: No    ALLERGIES:  has no known allergies.  MEDICATIONS:  Current Outpatient Medications  Medication Sig Dispense Refill   acyclovir  (ZOVIRAX ) 400 MG tablet Take 1 tablet (400 mg total) by mouth 2 (two) times daily. 120 tablet 2   albuterol  (VENTOLIN  HFA) 108 (90 Base) MCG/ACT inhaler Inhale 2 puffs into the lungs every 6 (six) hours as needed for wheezing or shortness of breath. 8 g 3   aspirin  EC 81 MG EC tablet Take 1 tablet (81 mg total) by mouth daily. 90 tablet 3   brimonidine -timolol  (COMBIGAN ) 0.2-0.5 % ophthalmic solution Apply 1 drop to eye 2 (two) times daily.     Brinzolamide -Brimonidine  (SIMBRINZA) 1-0.2 % SUSP Apply 1 drop to eye in the morning and at bedtime. I drop both eyes twice a day     Calcium  Carb-Cholecalciferol 600-10 MG-MCG TABS Take 1 tablet by mouth daily.     calcium  carbonate (CALCIUM  600 HIGH POTENCY) 600 MG TABS tablet Take 2 tablets (1,200 mg total) by mouth 2 (two) times daily. 120 tablet 2    dorzolamide (TRUSOPT) 2 % ophthalmic solution 1 drop 2 (two) times daily.     levothyroxine  (SYNTHROID ) 150 MCG tablet Take 1 tablet (150 mcg total) by mouth daily. For thyroid  30 tablet 1   lidocaine -prilocaine  (EMLA ) cream Apply 1 application topically as needed. Apply to port and cover with saran wrap 1-2 hours prior to port access 30 g 1   magnesium  oxide (MAG-OX) 400 (240 Mg) MG tablet Take 1 tablet (400 mg total) by mouth 2 (two) times daily. 90 tablet 1   montelukast  (SINGULAIR ) 10 MG tablet Take 1 tablet (10 mg total) by mouth at bedtime. 30 tablet 3   NIFEdipine (ADALAT CC) 30 MG 24 hr tablet Take 30 mg by mouth daily.     pantoprazole  (PROTONIX ) 40 MG tablet Take 1 tablet (40 mg total) by mouth daily. 90 tablet 0   polyethylene glycol (MIRALAX  / GLYCOLAX ) packet Take 17 g by mouth daily as needed for mild constipation. 14 each 0   spironolactone  (ALDACTONE ) 25 MG tablet Take 1 tablet (25 mg total) by mouth daily as needed (edema). 90 tablet 1   traMADol  (ULTRAM ) 50 MG tablet Take 1 tablet (50 mg total) by mouth every 12 (twelve) hours as needed. 60 tablet 0   Vitamin D , Ergocalciferol , (DRISDOL ) 1.25  MG (50000 UNIT) CAPS capsule Take 1 capsule (50,000 Units total) by mouth once a week. 12 capsule 0   furosemide  (LASIX ) 40 MG tablet Take 1 tablet (40 mg total) by mouth daily as needed for edema or fluid. 30 tablet 1   No current facility-administered medications for this visit.   Facility-Administered Medications Ordered in Other Visits  Medication Dose Route Frequency Provider Last Rate Last Admin   0.9 %  sodium chloride  infusion   Intravenous Once Chancey Cullinane R, MD       carfilzomib  (KYPROLIS ) 100 mg in dextrose  5 % 100 mL chemo infusion  56 mg/m2 (Treatment Plan Recorded) Intravenous Once Zelpha Messing R, MD       diphenhydrAMINE  (BENADRYL ) injection 50 mg  50 mg Intravenous Once Leaann Nevils R, MD       famotidine  (PEPCID ) IVPB 20 mg premix  20 mg Intravenous  Once Starkisha Tullis R, MD       isatuximab -irfc (SARCLISA ) 700 mg in sodium chloride  0.9 % 215 mL (2.8 mg/mL) chemo infusion  10 mg/kg (Treatment Plan Recorded) Intravenous Once Aubrianne Molyneux R, MD        PHYSICAL EXAMINATION:   BP 122/66 (BP Location: Right Arm, Patient Position: Sitting, Cuff Size: Normal)   Pulse 69   Temp 98.6 F (37 C) (Tympanic)   Resp 16   Ht 5' 2 (1.575 m)   Wt 161 lb (73 kg)   SpO2 100%   BMI 29.45 kg/m   Filed Weights   06/26/24 0859  Weight: 161 lb (73 kg)         Mild leg swelling.   Physical Exam HENT:     Head: Normocephalic and atraumatic.  Eyes:     Pupils: Pupils are equal, round, and reactive to light.  Cardiovascular:     Rate and Rhythm: Normal rate and regular rhythm.  Pulmonary:     Effort: Pulmonary effort is normal. No respiratory distress.     Breath sounds: Normal breath sounds. No wheezing.  Abdominal:     General: Bowel sounds are normal. There is no distension.     Palpations: Abdomen is soft. There is no mass.     Tenderness: There is no abdominal tenderness. There is no guarding or rebound.  Musculoskeletal:        General: No tenderness. Normal range of motion.     Cervical back: Normal range of motion and neck supple.  Skin:    General: Skin is warm.  Neurological:     Mental Status: She is alert and oriented to person, place, and time.  Psychiatric:        Mood and Affect: Affect normal.    LABORATORY DATA:  I have reviewed the data as listed    Component Value Date/Time   NA 141 06/26/2024 0900   NA 141 01/25/2020 1530   K 3.3 (L) 06/26/2024 0900   CL 111 06/26/2024 0900   CO2 22 06/26/2024 0900   GLUCOSE 97 06/26/2024 0900   BUN 18 06/26/2024 0900   BUN 19 01/25/2020 1530   CREATININE 0.71 06/26/2024 0900   CALCIUM  8.1 (L) 06/26/2024 0900   PROT 7.2 06/26/2024 0900   ALBUMIN 3.9 06/26/2024 0900   AST 16 06/26/2024 0900   ALT 11 06/26/2024 0900   ALKPHOS 58 06/26/2024 0900    BILITOT 0.7 06/26/2024 0900   GFRNONAA >60 06/26/2024 0900   GFRAA NOT CALCULATED 04/17/2020 1352    No results found for: SPEP, UPEP  Lab Results  Component Value Date   WBC 3.7 (L) 06/26/2024   NEUTROABS 1.2 (L) 06/26/2024   HGB 11.7 (L) 06/26/2024   HCT 35.5 (L) 06/26/2024   MCV 95.2 06/26/2024   PLT 191 06/26/2024      Chemistry      Component Value Date/Time   NA 141 06/26/2024 0900   NA 141 01/25/2020 1530   K 3.3 (L) 06/26/2024 0900   CL 111 06/26/2024 0900   CO2 22 06/26/2024 0900   BUN 18 06/26/2024 0900   BUN 19 01/25/2020 1530   CREATININE 0.71 06/26/2024 0900      Component Value Date/Time   CALCIUM  8.1 (L) 06/26/2024 0900   ALKPHOS 58 06/26/2024 0900   AST 16 06/26/2024 0900   ALT 11 06/26/2024 0900   BILITOT 0.7 06/26/2024 0900      (H): Data is abnormally high   Latest Reference Range & Units Most Recent 12/24/20 14:01 04/01/21 14:34 04/28/21 09:17 06/11/21 09:42 07/03/21 10:32 08/20/21 10:06  M Protein SerPl Elph-Mcnc Not Observed g/dL 1.1 (H) (C) 8/73/76 89:93 Not Observed (C) 0.6 (H) (C) 0.7 (H) (C) 0.5 (H) (C) 0.7 (H) (C) 1.1 (H) (C)    Latest Reference Range & Units Most Recent 09/17/21 08:47 10/15/21 08:25 11/12/21 08:55  Kappa free light chain 3.3 - 19.4 mg/L 5.0 11/12/21 08:55 7.2 7.2 5.0  Lambda free light chains 5.7 - 26.3 mg/L 223.6 (H) 11/12/21 08:55 215.3 (H) 340.2 (H) 223.6 (H)  Kappa, lambda light chain ratio 0.26 - 1.65  0.02 (L) 11/12/21 08:55 0.03 (L) 0.02 (L) 0.02 (L)  (H): Data is abnormally high (L): Data is abnormally low  RADIOGRAPHIC STUDIES: I have personally reviewed the radiological images as listed and agreed with the findings in the report. No results found.    ASSESSMENT & PLAN:  Multiple myeloma not having achieved remission (HCC) # RECURRENT Multiple myeloma stage III [high-risk cytogenetics-status post KRD-VGPR ; status post autologous stem cell transplant on 06/15/18.   MAY 2024- 1.3. ]  currently on  Isa-Dex; d-2 carfil weekly q 28 days. # MARCH 2025- M-protein -0.3 ; kappa lambda light chain ratio-1.8 lambda light chain 63-slowly RISING FEB 2025-chemotherapy was interrupted-until MAY 2025- because of thigh infection needing wound VAC.    # S/p re-evaluation at [facility] in June 2025- Labs c/w early progression. No evidence of end organ damage-  PET SEP 2025-  No findings of active myeloma.  low-grade activity in a 1.5 cm partially sclerotic lesion in the right distal femoral metaphysis, probably an enchondroma or similar incidental benign lesion. The appearance is not characteristic for myeloma.   # GIVEN PET NEGATIVITY- For now continue with with  Isa-Dex-Carfil- Labs-CBC/chemistries were reviewed with the patient. OCT 2025- MM/K-l light chain- RISING.  Will monitor closely-and repeat PET scan/bone marrow biopsy to confirm progression.  If clinical progression noted would recommend evaluation again at [facility] for CAR-T/bispecific's. Discussed with patient and also Dr.gasperetto.   # DROP in EF:  MARCH 2024-Echo [[facility]] Left ventricular EF 50 to 55%;  ECHO  04/23/2024 50-55%- stable.  Will repeat again in dec-jan 2025. Ordered today  # PN G-1-2 [cold sensitivity]- no pain/T or Numbness- continue symptomatic management-  stable.    # Anemia- JAn 2025- I ron sat- 8%; ferritin- 19 on prn venofer - stable.    # Electrolyte: Hypokalemia: mild-  continue compliance Kdur TID-; hypocalcemia- 8.0-   stable.   # Iatrogenic hypothyroidism [ Graves/goiter s/p total thyroidectomy;aug,2022 ]-currently on Synthroid  175 mcg/day-;  will decrease the dose to 150 -  TSH < 0.01- will repeat  # MAY 2025- thigh infection s/p wound VAC [Dr.Rodenberg]  prn  IVIG prn/monthly- SEP Ig G > 700- monitor for now.    stable.    # Glaucoma BIL- L > R [Dr.King; [facility]] - ? Steroid induced on eye drops. decreased the steroids. Discussed with Dr.Haines, ophthalmology- Glaucoma under control- s/p surgery for retinal detachment-on  11/12. Stable.   # chronic  Lower back discomfort - Continue tramadol - refilled .Stable.   # Bone lesions /last zometa  on 11/27/2017.  S/p   Dental extraction- on dentures-  PET scan- NO evidence of any bone lesions. ? Zometa   given risk of ONJ. Stable.   # Infection prophylaxis: Acyclovir ; asprin- add IVIG infusion [IgG < 400]- plan  IVIG- q 4 w #1 on- 9/17-Stable.   # IV access: mediport- functional  # ACP: Patient has  incurable cancer.  However patient is currently doing well. Patient interested in continue current scope of therapy; and full code.  6 w- given dental extrac- on 1/26. :MM panel; K/l light chain ratio q 4 W  ; NO zometa - dental issues-ok; but hypocalemia  Starting aug 25th- q2w-MD: D-isa-dex;car1&2-car; weekly x 3; week 4-OFF;  q 28 day cycle- pushed off to 6 weeks- 11/04 PS:  # DISPOSITION:  # 2d echo in 1 month # chemo today; and tomorrow as planned # order standing orders for 4 weeks- MM panel/k-l light chains- # follow up as per IS-  -Dr.B   Orders Placed This Encounter  Procedures   ECHOCARDIOGRAM COMPLETE    Standing Status:   Future    Expected Date:   07/27/2024    Expiration Date:   06/26/2025    Where should this test be performed:   [facility]    Perflutren DEFINITY (image enhancing agent) should be administered unless hypersensitivity or allergy exist:   Administer Perflutren    Reason for exam-Echo:   Chemo  Z09   All questions were answered. The patient knows to call the clinic with any problems, questions or concerns.      Cindy JONELLE Joe, MD 06/26/2024 10:30 AM End

## 2024-06-26 NOTE — Patient Instructions (Signed)
 [facility] - A DEPT OF [facility]  Discharge Instructions: Thank you for choosing [facility] to provide your oncology and hematology care.  If you have a lab appointment with the Cancer Center, please go directly to the Cancer Center and check in at the registration area.  Wear comfortable clothing and clothing appropriate for easy access to any Portacath or PICC line.   We strive to give you quality time with your provider. You may need to reschedule your appointment if you arrive late (15 or more minutes).  Arriving late affects you and other patients whose appointments are after yours.  Also, if you miss three or more appointments without notifying the office, you may be dismissed from the clinic at the provider's discretion.      For prescription refill requests, have your pharmacy contact our office and allow 72 hours for refills to be completed.    Today you received the following chemotherapy and/or immunotherapy agents- sarclisa , kyprolis       To help prevent nausea and vomiting after your treatment, we encourage you to take your nausea medication as directed.  BELOW ARE SYMPTOMS THAT SHOULD BE REPORTED IMMEDIATELY: *FEVER GREATER THAN 100.4 F (38 C) OR HIGHER *CHILLS OR SWEATING *NAUSEA AND VOMITING THAT IS NOT CONTROLLED WITH YOUR NAUSEA MEDICATION *UNUSUAL SHORTNESS OF BREATH *UNUSUAL BRUISING OR BLEEDING *URINARY PROBLEMS (pain or burning when urinating, or frequent urination) *BOWEL PROBLEMS (unusual diarrhea, constipation, pain near the anus) TENDERNESS IN MOUTH AND THROAT WITH OR WITHOUT PRESENCE OF ULCERS (sore throat, sores in mouth, or a toothache) UNUSUAL RASH, SWELLING OR PAIN  UNUSUAL VAGINAL DISCHARGE OR ITCHING   Items with * indicate a potential emergency and should be followed up as soon as possible or go to the Emergency Department if any problems should occur.  Please show the CHEMOTHERAPY ALERT CARD or  IMMUNOTHERAPY ALERT CARD at check-in to the Emergency Department and triage nurse.  Should you have questions after your visit or need to cancel or reschedule your appointment, please contact [facility] - A DEPT OF [facility]  [phone] and follow the prompts.  Office hours are 8:00 a.m. to 4:30 p.m. Monday - Friday. Please note that voicemails left after 4:00 p.m. may not be returned until the following business day.  We are closed weekends and major holidays. You have access to a nurse at all times for urgent questions. Please call the main number to the clinic [phone] and follow the prompts.  For any non-urgent questions, you may also contact your provider using MyChart. We now offer e-Visits for anyone 54 and older to request care online for non-urgent symptoms. For details visit [email or web address].   Also download the MyChart app! Go to the app store, search MyChart, open the app, select [facility], and log in with your MyChart username and password.

## 2024-06-26 NOTE — Assessment & Plan Note (Signed)
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   MAY 2024- 1.3. ]  currently on Isa-Dex; d-2 carfil weekly q 28 days. # MARCH 2025- M-protein -0.3 ; kappa lambda light chain ratio-1.8 lambda light chain 63-slowly RISING FEB 2025-chemotherapy was interrupted-until MAY 2025- because of thigh infection needing wound VAC.    # S/p re-evaluation at [facility] in June 2025- Labs c/w early progression. No evidence of end organ damage-  PET SEP 2025-  No findings of active myeloma.  low-grade activity in a 1.5 cm partially sclerotic lesion in the right distal femoral metaphysis, probably an enchondroma or similar incidental benign lesion. The appearance is not characteristic for myeloma.   # GIVEN PET NEGATIVITY- For now continue with with  Isa-Dex-Carfil- Labs-CBC/chemistries were reviewed with the patient. OCT 2025- MM/K-l light chain- RISING.  Will monitor closely-and repeat PET scan/bone marrow biopsy to confirm progression.  If clinical progression noted would recommend evaluation again at [facility] for CAR-T/bispecific's. Discussed with patient and also Dr.gasperetto.   # DROP in EF:  MARCH 2024-Echo [[facility]] Left ventricular EF 50 to 55%;  ECHO  04/23/2024 50-55%- stable.  Will repeat again in dec-jan 2025. Ordered today  # PN G-1-2 [cold sensitivity]- no pain/T or Numbness- continue symptomatic management-  stable.    # Anemia- JAn 2025- I ron sat- 8%; ferritin- 19 on prn venofer - stable.    # Electrolyte: Hypokalemia: mild-  continue compliance Kdur TID-; hypocalcemia- 8.0-   stable.   # Iatrogenic hypothyroidism [ Graves/goiter s/p total thyroidectomy;aug,2022 ]-currently on Synthroid  175 mcg/day-; will decrease the dose to 150 -  TSH < 0.01- will repeat  # MAY 2025- thigh infection s/p wound VAC [Dr.Rodenberg]  prn  IVIG prn/monthly- SEP Ig G > 700- monitor for now.    stable.    # Glaucoma BIL- L > R [Dr.King;  [facility]] - ? Steroid induced on eye drops. decreased the steroids. Discussed with Dr.Haines, ophthalmology- Glaucoma under control- s/p surgery for retinal detachment-on 11/12. Stable.   # chronic  Lower back discomfort - Continue tramadol - refilled .Stable.   # Bone lesions /last zometa  on 11/27/2017.  S/p   Dental extraction- on dentures-  PET scan- NO evidence of any bone lesions. ? Zometa   given risk of ONJ. Stable.   # Infection prophylaxis: Acyclovir ; asprin- add IVIG infusion [IgG < 400]- plan  IVIG- q 4 w #1 on- 9/17-Stable.   # IV access: mediport- functional  # ACP: Patient has  incurable cancer.  However patient is currently doing well. Patient interested in continue current scope of therapy; and full code.  6 w- given dental extrac- on 1/26. :MM panel; K/l light chain ratio q 4 W  ; NO zometa - dental issues-ok; but hypocalemia  Starting aug 25th- q2w-MD: D-isa-dex;car1&2-car; weekly x 3; week 4-OFF;  q 28 day cycle- pushed off to 6 weeks- 11/04 PS:  # DISPOSITION:  # 2d echo in 1 month # chemo today; and tomorrow as planned # order standing orders for 4 weeks- MM panel/k-l light chains- # follow up as per IS-  -Dr.B

## 2024-06-27 ENCOUNTER — Other Ambulatory Visit: Payer: Self-pay

## 2024-06-27 ENCOUNTER — Inpatient Hospital Stay

## 2024-06-27 VITALS — BP 119/66 | HR 67 | Temp 97.2°F | Resp 16

## 2024-06-27 DIAGNOSIS — Z5112 Encounter for antineoplastic immunotherapy: Secondary | ICD-10-CM | POA: Diagnosis not present

## 2024-06-27 DIAGNOSIS — C9000 Multiple myeloma not having achieved remission: Secondary | ICD-10-CM

## 2024-06-27 MED ORDER — SODIUM CHLORIDE 0.9 % IV SOLN
Freq: Once | INTRAVENOUS | Status: AC
  Filled 2024-06-27: qty 250

## 2024-06-27 MED ORDER — DEXTROSE 5 % IV SOLN
56.0000 mg/m2 | Freq: Once | INTRAVENOUS | Status: AC
  Administered 2024-06-27: 100 mg via INTRAVENOUS
  Filled 2024-06-27: qty 30

## 2024-06-27 MED ORDER — DEXAMETHASONE SOD PHOSPHATE PF 10 MG/ML IJ SOLN
5.0000 mg | Freq: Once | INTRAMUSCULAR | Status: AC
  Administered 2024-06-27: 5 mg via INTRAVENOUS

## 2024-06-27 NOTE — Patient Instructions (Signed)
 [facility] - A DEPT OF [facility]  Discharge Instructions: Thank you for choosing [facility] to provide your oncology and hematology care.  If you have a lab appointment with the Cancer Center, please go directly to the Cancer Center and check in at the registration area.  Wear comfortable clothing and clothing appropriate for easy access to any Portacath or PICC line.   We strive to give you quality time with your provider. You may need to reschedule your appointment if you arrive late (15 or more minutes).  Arriving late affects you and other patients whose appointments are after yours.  Also, if you miss three or more appointments without notifying the office, you may be dismissed from the clinic at the provider's discretion.      For prescription refill requests, have your pharmacy contact our office and allow 72 hours for refills to be completed.    Today you received the following chemotherapy and/or immunotherapy agents kyprolis      To help prevent nausea and vomiting after your treatment, we encourage you to take your nausea medication as directed.  BELOW ARE SYMPTOMS THAT SHOULD BE REPORTED IMMEDIATELY: *FEVER GREATER THAN 100.4 F (38 C) OR HIGHER *CHILLS OR SWEATING *NAUSEA AND VOMITING THAT IS NOT CONTROLLED WITH YOUR NAUSEA MEDICATION *UNUSUAL SHORTNESS OF BREATH *UNUSUAL BRUISING OR BLEEDING *URINARY PROBLEMS (pain or burning when urinating, or frequent urination) *BOWEL PROBLEMS (unusual diarrhea, constipation, pain near the anus) TENDERNESS IN MOUTH AND THROAT WITH OR WITHOUT PRESENCE OF ULCERS (sore throat, sores in mouth, or a toothache) UNUSUAL RASH, SWELLING OR PAIN  UNUSUAL VAGINAL DISCHARGE OR ITCHING   Items with * indicate a potential emergency and should be followed up as soon as possible or go to the Emergency Department if any problems should occur.  Please show the CHEMOTHERAPY ALERT CARD or IMMUNOTHERAPY  ALERT CARD at check-in to the Emergency Department and triage nurse.  Should you have questions after your visit or need to cancel or reschedule your appointment, please contact [facility] - A DEPT OF [facility]  [phone] and follow the prompts.  Office hours are 8:00 a.m. to 4:30 p.m. Monday - Friday. Please note that voicemails left after 4:00 p.m. may not be returned until the following business day.  We are closed weekends and major holidays. You have access to a nurse at all times for urgent questions. Please call the main number to the clinic [phone] and follow the prompts.  For any non-urgent questions, you may also contact your provider using MyChart. We now offer e-Visits for anyone 63 and older to request care online for non-urgent symptoms. For details visit [email or web address].   Also download the MyChart app! Go to the app store, search "MyChart", open the app, select [place], and log in with your MyChart username and password.

## 2024-06-28 ENCOUNTER — Encounter: Payer: Self-pay | Admitting: Internal Medicine

## 2024-07-03 ENCOUNTER — Inpatient Hospital Stay

## 2024-07-03 ENCOUNTER — Encounter: Payer: Self-pay | Admitting: Internal Medicine

## 2024-07-03 VITALS — BP 126/67 | HR 73 | Temp 98.6°F | Resp 16 | Wt 163.4 lb

## 2024-07-03 DIAGNOSIS — C9000 Multiple myeloma not having achieved remission: Secondary | ICD-10-CM

## 2024-07-03 DIAGNOSIS — Z5112 Encounter for antineoplastic immunotherapy: Secondary | ICD-10-CM | POA: Diagnosis not present

## 2024-07-03 LAB — CBC WITH DIFFERENTIAL (CANCER CENTER ONLY)
Abs Immature Granulocytes: 0.01 K/uL (ref 0.00–0.07)
Basophils Absolute: 0 K/uL (ref 0.0–0.1)
Basophils Relative: 0 %
Eosinophils Absolute: 0.1 K/uL (ref 0.0–0.5)
Eosinophils Relative: 2 %
HCT: 35.2 % — ABNORMAL LOW (ref 36.0–46.0)
Hemoglobin: 11.8 g/dL — ABNORMAL LOW (ref 12.0–15.0)
Immature Granulocytes: 0 %
Lymphocytes Relative: 44 %
Lymphs Abs: 1.8 K/uL (ref 0.7–4.0)
MCH: 31.8 pg (ref 26.0–34.0)
MCHC: 33.5 g/dL (ref 30.0–36.0)
MCV: 94.9 fL (ref 80.0–100.0)
Monocytes Absolute: 0.4 K/uL (ref 0.1–1.0)
Monocytes Relative: 9 %
Neutro Abs: 1.8 K/uL (ref 1.7–7.7)
Neutrophils Relative %: 45 %
Platelet Count: 81 K/uL — ABNORMAL LOW (ref 150–400)
RBC: 3.71 MIL/uL — ABNORMAL LOW (ref 3.87–5.11)
RDW: 13.7 % (ref 11.5–15.5)
WBC Count: 4.1 K/uL (ref 4.0–10.5)
nRBC: 0 % (ref 0.0–0.2)

## 2024-07-03 LAB — CMP (CANCER CENTER ONLY)
ALT: 12 U/L (ref 0–44)
AST: 13 U/L — ABNORMAL LOW (ref 15–41)
Albumin: 4.2 g/dL (ref 3.5–5.0)
Alkaline Phosphatase: 66 U/L (ref 38–126)
Anion gap: 14 (ref 5–15)
BUN: 13 mg/dL (ref 8–23)
CO2: 19 mmol/L — ABNORMAL LOW (ref 22–32)
Calcium: 8.6 mg/dL — ABNORMAL LOW (ref 8.9–10.3)
Chloride: 111 mmol/L (ref 98–111)
Creatinine: 0.69 mg/dL (ref 0.44–1.00)
GFR, Estimated: >60 mL/min (ref >60)
Glucose, Bld: 84 mg/dL (ref 70–99)
Potassium: 3.2 mmol/L — ABNORMAL LOW (ref 3.5–5.1)
Sodium: 144 mmol/L (ref 135–145)
Total Bilirubin: 0.5 mg/dL (ref 0.0–1.2)
Total Protein: 6.9 g/dL (ref 6.5–8.1)

## 2024-07-03 MED ORDER — DEXAMETHASONE SOD PHOSPHATE PF 10 MG/ML IJ SOLN
5.0000 mg | Freq: Once | INTRAMUSCULAR | Status: AC
  Administered 2024-07-03: 5 mg via INTRAVENOUS

## 2024-07-03 MED ORDER — DEXTROSE 5 % IV SOLN
56.0000 mg/m2 | Freq: Once | INTRAVENOUS | Status: AC
  Administered 2024-07-03: 100 mg via INTRAVENOUS
  Filled 2024-07-03: qty 30

## 2024-07-03 MED ORDER — SODIUM CHLORIDE 0.9 % IV SOLN
Freq: Once | INTRAVENOUS | Status: AC
  Filled 2024-07-03: qty 250

## 2024-07-03 NOTE — Patient Instructions (Signed)
 [facility] - A DEPT OF [facility]  Discharge Instructions: Thank you for choosing [facility] to provide your oncology and hematology care.  If you have a lab appointment with the Cancer Center, please go directly to the Cancer Center and check in at the registration area.  Wear comfortable clothing and clothing appropriate for easy access to any Portacath or PICC line.   We strive to give you quality time with your provider. You may need to reschedule your appointment if you arrive late (15 or more minutes).  Arriving late affects you and other patients whose appointments are after yours.  Also, if you miss three or more appointments without notifying the office, you may be dismissed from the clinic at the provider's discretion.      For prescription refill requests, have your pharmacy contact our office and allow 72 hours for refills to be completed.    Today you received the following chemotherapy and/or immunotherapy agents kyprolis      To help prevent nausea and vomiting after your treatment, we encourage you to take your nausea medication as directed.  BELOW ARE SYMPTOMS THAT SHOULD BE REPORTED IMMEDIATELY: *FEVER GREATER THAN 100.4 F (38 C) OR HIGHER *CHILLS OR SWEATING *NAUSEA AND VOMITING THAT IS NOT CONTROLLED WITH YOUR NAUSEA MEDICATION *UNUSUAL SHORTNESS OF BREATH *UNUSUAL BRUISING OR BLEEDING *URINARY PROBLEMS (pain or burning when urinating, or frequent urination) *BOWEL PROBLEMS (unusual diarrhea, constipation, pain near the anus) TENDERNESS IN MOUTH AND THROAT WITH OR WITHOUT PRESENCE OF ULCERS (sore throat, sores in mouth, or a toothache) UNUSUAL RASH, SWELLING OR PAIN  UNUSUAL VAGINAL DISCHARGE OR ITCHING   Items with * indicate a potential emergency and should be followed up as soon as possible or go to the Emergency Department if any problems should occur.  Please show the CHEMOTHERAPY ALERT CARD or IMMUNOTHERAPY  ALERT CARD at check-in to the Emergency Department and triage nurse.  Should you have questions after your visit or need to cancel or reschedule your appointment, please contact [facility] - A DEPT OF [facility]  [phone] and follow the prompts.  Office hours are 8:00 a.m. to 4:30 p.m. Monday - Friday. Please note that voicemails left after 4:00 p.m. may not be returned until the following business day.  We are closed weekends and major holidays. You have access to a nurse at all times for urgent questions. Please call the main number to the clinic [phone] and follow the prompts.  For any non-urgent questions, you may also contact your provider using MyChart. We now offer e-Visits for anyone 63 and older to request care online for non-urgent symptoms. For details visit [email or web address].   Also download the MyChart app! Go to the app store, search "MyChart", open the app, select [place], and log in with your MyChart username and password.

## 2024-07-04 ENCOUNTER — Inpatient Hospital Stay

## 2024-07-04 VITALS — BP 128/71 | HR 76 | Temp 98.9°F | Resp 16

## 2024-07-04 DIAGNOSIS — C9000 Multiple myeloma not having achieved remission: Secondary | ICD-10-CM

## 2024-07-04 DIAGNOSIS — Z5112 Encounter for antineoplastic immunotherapy: Secondary | ICD-10-CM | POA: Diagnosis not present

## 2024-07-04 LAB — KAPPA/LAMBDA LIGHT CHAINS
Kappa free light chain: 1.2 mg/L — ABNORMAL LOW (ref 3.3–19.4)
Kappa, lambda light chain ratio: 0.01 — ABNORMAL LOW (ref 0.26–1.65)
Lambda free light chains: 170 mg/L — ABNORMAL HIGH (ref 5.7–26.3)

## 2024-07-04 MED ORDER — DEXTROSE 5 % IV SOLN
56.0000 mg/m2 | Freq: Once | INTRAVENOUS | Status: AC
  Administered 2024-07-04: 100 mg via INTRAVENOUS
  Filled 2024-07-04: qty 5

## 2024-07-04 MED ORDER — DEXAMETHASONE SOD PHOSPHATE PF 10 MG/ML IJ SOLN
5.0000 mg | Freq: Once | INTRAMUSCULAR | Status: AC
  Administered 2024-07-04: 5 mg via INTRAVENOUS

## 2024-07-04 MED ORDER — SODIUM CHLORIDE 0.9 % IV SOLN
Freq: Once | INTRAVENOUS | Status: AC
  Filled 2024-07-04: qty 250

## 2024-07-05 LAB — MULTIPLE MYELOMA PANEL, SERUM
Albumin SerPl Elph-Mcnc: 3.5 g/dL (ref 2.9–4.4)
Albumin/Glob SerPl: 1.3 (ref 0.7–1.7)
Alpha 1: 0.1 g/dL (ref 0.0–0.4)
Alpha2 Glob SerPl Elph-Mcnc: 0.7 g/dL (ref 0.4–1.0)
B-Globulin SerPl Elph-Mcnc: 1.3 g/dL (ref 0.7–1.3)
Gamma Glob SerPl Elph-Mcnc: 0.9 g/dL (ref 0.4–1.8)
Globulin, Total: 2.9 g/dL (ref 2.2–3.9)
IgA: 433 mg/dL — ABNORMAL HIGH (ref 64–422)
IgG (Immunoglobin G), Serum: 482 mg/dL — ABNORMAL LOW (ref 586–1602)
IgM (Immunoglobulin M), Srm: <5 mg/dL — ABNORMAL LOW (ref 26–217)
M Protein SerPl Elph-Mcnc: 0.4 g/dL — ABNORMAL HIGH
Total Protein ELP: 6.4 g/dL (ref 6.0–8.5)

## 2024-07-10 ENCOUNTER — Inpatient Hospital Stay

## 2024-07-10 ENCOUNTER — Telehealth: Payer: Self-pay | Admitting: *Deleted

## 2024-07-10 ENCOUNTER — Inpatient Hospital Stay: Admitting: Internal Medicine

## 2024-07-10 ENCOUNTER — Encounter: Payer: Self-pay | Admitting: Internal Medicine

## 2024-07-10 VITALS — BP 117/64 | HR 69 | Temp 97.0°F | Resp 19

## 2024-07-10 VITALS — BP 138/76 | HR 73 | Temp 98.4°F | Resp 16 | Wt 163.5 lb

## 2024-07-10 DIAGNOSIS — C9000 Multiple myeloma not having achieved remission: Secondary | ICD-10-CM | POA: Diagnosis not present

## 2024-07-10 DIAGNOSIS — Z5112 Encounter for antineoplastic immunotherapy: Secondary | ICD-10-CM | POA: Diagnosis not present

## 2024-07-10 LAB — CMP (CANCER CENTER ONLY)
ALT: 13 U/L (ref 0–44)
AST: 14 U/L — ABNORMAL LOW (ref 15–41)
Albumin: 4.1 g/dL (ref 3.5–5.0)
Alkaline Phosphatase: 61 U/L (ref 38–126)
Anion gap: 13 (ref 5–15)
BUN: 10 mg/dL (ref 8–23)
CO2: 20 mmol/L — ABNORMAL LOW (ref 22–32)
Calcium: 8.5 mg/dL — ABNORMAL LOW (ref 8.9–10.3)
Chloride: 111 mmol/L (ref 98–111)
Creatinine: 0.72 mg/dL (ref 0.44–1.00)
GFR, Estimated: >60 mL/min (ref >60)
Glucose, Bld: 107 mg/dL — ABNORMAL HIGH (ref 70–99)
Potassium: 3.2 mmol/L — ABNORMAL LOW (ref 3.5–5.1)
Sodium: 144 mmol/L (ref 135–145)
Total Bilirubin: 0.6 mg/dL (ref 0.0–1.2)
Total Protein: 6.7 g/dL (ref 6.5–8.1)

## 2024-07-10 LAB — CBC WITH DIFFERENTIAL (CANCER CENTER ONLY)
Abs Immature Granulocytes: 0.01 K/uL (ref 0.00–0.07)
Basophils Absolute: 0 K/uL (ref 0.0–0.1)
Basophils Relative: 0 %
Eosinophils Absolute: 0 K/uL (ref 0.0–0.5)
Eosinophils Relative: 1 %
HCT: 33.6 % — ABNORMAL LOW (ref 36.0–46.0)
Hemoglobin: 11.3 g/dL — ABNORMAL LOW (ref 12.0–15.0)
Immature Granulocytes: 0 %
Lymphocytes Relative: 44 %
Lymphs Abs: 1.4 K/uL (ref 0.7–4.0)
MCH: 31.5 pg (ref 26.0–34.0)
MCHC: 33.6 g/dL (ref 30.0–36.0)
MCV: 93.6 fL (ref 80.0–100.0)
Monocytes Absolute: 0.3 K/uL (ref 0.1–1.0)
Monocytes Relative: 9 %
Neutro Abs: 1.4 K/uL — ABNORMAL LOW (ref 1.7–7.7)
Neutrophils Relative %: 46 %
Platelet Count: 114 K/uL — ABNORMAL LOW (ref 150–400)
RBC: 3.59 MIL/uL — ABNORMAL LOW (ref 3.87–5.11)
RDW: 14.7 % (ref 11.5–15.5)
WBC Count: 3.1 K/uL — ABNORMAL LOW (ref 4.0–10.5)
nRBC: 0.6 % — ABNORMAL HIGH (ref 0.0–0.2)

## 2024-07-10 MED ORDER — DIPHENHYDRAMINE HCL 50 MG/ML IJ SOLN
50.0000 mg | Freq: Once | INTRAMUSCULAR | Status: AC
  Administered 2024-07-10: 10:00:00 50 mg via INTRAVENOUS
  Filled 2024-07-10: qty 1

## 2024-07-10 MED ORDER — DEXAMETHASONE SOD PHOSPHATE PF 10 MG/ML IJ SOLN
5.0000 mg | Freq: Once | INTRAMUSCULAR | Status: AC
  Administered 2024-07-10: 11:00:00 5 mg via INTRAVENOUS

## 2024-07-10 MED ORDER — TRAMADOL HCL 50 MG PO TABS: 50.0000 mg | ORAL_TABLET | Freq: Two times a day (BID) | ORAL | 0 refills | Status: DC | PRN

## 2024-07-10 MED ORDER — DEXTROSE 5 % IV SOLN
56.0000 mg/m2 | Freq: Once | INTRAVENOUS | Status: AC
  Administered 2024-07-10: 13:00:00 100 mg via INTRAVENOUS
  Filled 2024-07-10: qty 30

## 2024-07-10 MED ORDER — SODIUM CHLORIDE 0.9 % IV SOLN
Freq: Once | INTRAVENOUS | Status: AC
  Filled 2024-07-10: qty 250

## 2024-07-10 MED ORDER — SODIUM CHLORIDE 0.9 % IV SOLN
10.0000 mg/kg | Freq: Once | INTRAVENOUS | Status: AC
  Administered 2024-07-10: 11:00:00 700 mg via INTRAVENOUS
  Filled 2024-07-10: qty 25

## 2024-07-10 MED ORDER — ACETAMINOPHEN 325 MG PO TABS
650.0000 mg | ORAL_TABLET | Freq: Once | ORAL | Status: AC
  Administered 2024-07-10: 10:00:00 650 mg via ORAL
  Filled 2024-07-10: qty 2

## 2024-07-10 MED ORDER — FAMOTIDINE IN NACL 20-0.9 MG/50ML-% IV SOLN
20.0000 mg | Freq: Once | INTRAVENOUS | Status: AC
  Administered 2024-07-10: 10:00:00 20 mg via INTRAVENOUS
  Filled 2024-07-10: qty 50

## 2024-07-10 NOTE — Progress Notes (Signed)
 Left shoulder is tender and achy. Taking Tramadol  with small amount pain relief. Appetite is good. Energy is improving. Dyspnea when she bends over. Sleep is off and On.

## 2024-07-10 NOTE — Progress Notes (Signed)
 [facility] OFFICE PROGRESS NOTE  Patient Care Team: Buren Rock HERO, MD as PCP - General (Family Medicine) End, Lonni, MD as PCP - Cardiology (Cardiology) Guinevere File, [name] Rennie Cindy SAUNDERS, MD as Consulting Physician (Oncology) Parris Manna, MD as Consulting Physician (Pulmonary Disease) Damian Therisa HERO, MD as Consulting Physician (Endocrinology)   Cancer Staging  Multiple myeloma not having achieved remission [facility]) Staging form: Plasma Cell Myeloma and Plasma Cell Disorders, AJCC 8th Edition - Clinical: No stage assigned - Unsigned - Clinical: No stage assigned - Unsigned   Oncology History Overview Note  # SEP 2018- MULTIPLE MYELOMA IgALamda [2.5 gm/dl; K/L= 05/8700]; STAGE III [beta 2 microglobulin=5.5] [presented with acute renal failure; anemia; NO hypercalcemia; Skeletal survey-Normal]; BMBx- 45% plasma cells; FISH-POSITIVE 11:14 translocation.[STANDARD-high RISK]/cyto-Normal; SEP 2018- PET- L3 posterior element lesion.   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 No morphologic support for residual myeloma disease. Negative for minimal residual disease by MM-MRD flow cytometry; HOLD REVLIMID  [leg swelling]; AUG 2021-PET scan negative for myeloma; continue to hold Revlimid   # MARCH 2021-diastolic congestive heart failure [Dr.End]  # BMBx- OCT 2021- BMBx- 5% plasmacytosis-however this appears to be more polyclonal  rather than monoclonal-not explain patient worsening anemia.   # OCT 2022- 18th- Dara- Rev-Dex; MARCH, 2024- PROGRESSION bone marrow biopsy 70% plasma cells; PET scan no bone lesions-however conus medullaris uptake-MRI lumbar spine-   # MAY 2024- DISCONTINUE carfilzomib -dexamethasone - Pomalyst ; [rising M-protein; MAY 2024- 1.3. ]   # start 12/27/2022 -Isa-Carfil-Dex;  [decrease dexamethasone  20 weekly- starting 05/04/23- sec to glaucoma].   # OCT 24th, 2024- Bone marrow Biopsy: The findings are consistent with  focal minimal residual lambda restricted plasma cell neoplasm- HOLD treatment sec to glaucoma; and drop in EF AUG 2024- 45-50% [March 2024- 50-55%]  # MARCH 16th, 2025- re-started Isa-Carfil-Dex;  #November 2021-hyperthyroidism/goiter- [D.Bennett/Dr.Solum]-methimazole . S/p Thyroidectomy [Dr.Kim; [facility]-AUG 2022]  --------------------------  # 12/12- RIGHT JUGULAR DVT-x 66m on xarelto ; finished April 2020; September 2020-EGD/dysphagia; Dr. Therisa  # Acute renal failure [Dr.Singh; Proteinuria 1.5gm/day ]; acyclovir Mickeal ------------------------------------------------------------------------------------------------------------   DIAGNOSIS: [ ]  MULTIPLE MYELOMA  STAGE: III/HIGH RISK ;GOALS: CONTROL      Multiple myeloma not having achieved remission (HCC)  11/21/2017 - 04/28/2018 Chemotherapy   Patient is on Treatment Plan : MYELOMA SALVAGE Cyclophosphamide  / Carfilzomib  / Dexamethasone  (CCd) q28d     05/12/2021 - 02/25/2022 Chemotherapy   Patient is on Treatment Plan : MYELOMA RELAPSED REFRACTORY Daratumumab  SQ + Lenalidomide  + Dexamethasone  (DaraRd) q28d     05/12/2021 - 07/23/2022 Chemotherapy   Patient is on Treatment Plan : MYELOMA RELAPSED REFRACTORY Daratumumab  SQ + Lenalidomide  + Dexamethasone  (DaraRd) q28d     11/02/2022 - 11/16/2022 Chemotherapy   Patient is on Treatment Plan : MYELOMA RELAPSED/ REFRACTORY Carfilzomib  D1,8,15 (20/27) + Pomalidomide  + Dexamethasone  (KPd) q28d      12/27/2022 -  Chemotherapy   Patient is on Treatment Plan : MYELOMA Carfilzomib  + Dexamethasone  + Isatuximab  q28d      INTERVAL HISTORY: Ambulating  independently.  Alone.  Brandi  E [name] 73 y.o.  female pleasant patient above history of relapsed multiple myeloma--currently re-started back after 3 months break [JULY 28th, 2025]  Isa- Carfil-Dex  is here for a follow up.   Discussed the use of AI scribe software for clinical note transcription with the patient, who gave verbal consent to proceed.  History of Present Illness   Brandi [name] is a 73 year old female with multiple myeloma not in remission, currently receiving chemotherapy, who presents for hematology-oncology follow-up to address disease status and infection risk.  She reports feeling well overall and is aware of rising cancer markers over the past year, with the most recent bone marrow biopsy performed approximately one year ago. She expresses concern about possible disease progression but denies fever, chills, or other acute systemic symptoms. She prefers to have bone marrow biopsies performed locally due to prior discomfort at an outside facility.  She continues to experience recurrent skin abscesses, most recently on her leg, which spontaneously drained overnight and is currently healing. . She has previously received injections for infections in her joints and legs. No new systemic symptoms are reported.  She describes persistent, intermittent left shoulder pain and tenderness, attributed to a prior shoulder tear. The pain varies in severity, sometimes allowing full range of motion, while on other days limiting movement. Sleeping on her side and rolling over exacerbate the pain, which she manages with tramadol  and occasional acetaminophen . She has not seen an orthopedic specialist and is reluctant to pursue further evaluation unless recommended. No symptoms are present in the right shoulder.  She reports persistent  increased appetite. Regarding vision, she notes improved central vision following a prior procedure to remove scar tissue and oil, but continues to lack peripheral vision. She has a follow-up ophthalmology appointment scheduled.       Review of Systems  Constitutional:  Positive for malaise/fatigue. Negative for chills, diaphoresis and fever.  HENT:  Negative for nosebleeds and sore throat.   Eyes:  Negative for double vision.  Respiratory:  Negative for hemoptysis.   Cardiovascular:  Negative for chest pain, palpitations and orthopnea.  Gastrointestinal:  Negative for abdominal pain, blood in stool, constipation, heartburn and melena.  Genitourinary:  Negative for dysuria, frequency and urgency.  Musculoskeletal:  Positive for back pain.  Skin: Negative.  Negative for itching and rash.  Neurological:  Negative for dizziness, tingling, focal weakness, weakness and headaches.  Endo/Heme/Allergies:  Does not bruise/bleed easily.  Psychiatric/Behavioral:  Negative for depression. The patient is not nervous/anxious.     PAST MEDICAL HISTORY :  Past Medical History:  Diagnosis Date   (HFpEF) heart failure with preserved ejection fraction (HCC)    a. 06/2020 Echo: EF 60-65%, no rwma, nl RV size/fxn. Triv MR. Triv TR/AI. Mod elev PASP.   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   Open wound of left thigh 12/15/2023   Pneumonia    Red blood cell antibody positive, compatible PRBC difficult to obtain    S/P autologous bone marrow transplantation (HCC)    Sepsis (HCC) 11/25/2023    PAST SURGICAL HISTORY :   Past Surgical History:  Procedure Laterality Date   ANTERIOR VITRECTOMY Left 10/07/2021   Procedure: ANTERIOR  VITRECTOMY;  Surgeon:  Mittie Gaskin, MD;  Location: [facility];  Service: Ophthalmology;  Laterality: Left;   CATARACT EXTRACTION W/PHACO Left 10/07/2021   Procedure: CATARACT EXTRACTION PHACO AND INTRAOCULAR LENS PLACEMENT (IOC) LEFT VISION BLUE;  Surgeon: Mittie Gaskin, MD;  Location: [facility];  Service: Ophthalmology;  Laterality: Left;  kit for manual incision 14.56 01:21.4   CATARACT EXTRACTION W/PHACO Right 10/21/2021   Procedure: CATARACT EXTRACTION PHACO AND INTRAOCULAR LENS PLACEMENT (IOC) RIGHT 11.12 01:48.1;  Surgeon: Mittie Gaskin, MD;  Location: [facility];  Service: Ophthalmology;  Laterality: Right;   ESOPHAGOGASTRODUODENOSCOPY (EGD) WITH PROPOFOL  N/A 03/30/2019   Procedure: ESOPHAGOGASTRODUODENOSCOPY (EGD) WITH PROPOFOL ;  Surgeon: Therisa Bi, MD;  Location: [facility];  Service: Gastroenterology;  Laterality: N/A;   INCISION AND DRAINAGE ABSCESS Left 12/01/2023   Procedure: INCISION AND DRAINAGE, ABSCESS;  Surgeon: Lane Shope, MD;  Location: [facility] OR[facility];  Service: General;  Laterality: Left;   IR BONE MARROW BIOPSY & ASPIRATION  05/25/2023   IR FLUORO GUIDE PORT INSERTION RIGHT  12/16/2017   IR IMAGING GUIDED PORT INSERTION  06/15/2021   KNEE ARTHROSCOPY Left 09/03/2022   Procedure: ARTHROSCOPY KNEE DEBRIDEMENT;  Surgeon: Leora Lynwood SAUNDERS, MD;  Location: [facility];  Service: Orthopedics;  Laterality: Left;   THYROIDECTOMY N/A     FAMILY HISTORY :   Family History  Problem Relation Age of Onset   Pneumonia Mother    Seizures Father     SOCIAL HISTORY:   Social History   Tobacco Use   Smoking status: Former    Current packs/day: 0.00    Average packs/day: 0.3 packs/day    Types: Cigarettes    Quit date: 03/18/2021    Years since quitting: 3.3   Smokeless tobacco: Never   Tobacco comments:    2 cigarettes QOD  Vaping Use   Vaping status: Never Used  Substance Use Topics   Alcohol use: No   Drug use: No     ALLERGIES:  has no known allergies.  MEDICATIONS:  Current Outpatient Medications  Medication Sig Dispense Refill   acyclovir  (ZOVIRAX ) 400 MG tablet Take 1 tablet (400 mg total) by mouth 2 (two) times daily. 120 tablet 2   albuterol  (VENTOLIN  HFA) 108 (90 Base) MCG/ACT inhaler Inhale 2 puffs into the lungs every 6 (six) hours as needed for wheezing or shortness of breath. 8 g 3   aspirin  EC 81 MG EC tablet Take 1 tablet (81 mg total) by mouth daily. 90 tablet 3   brimonidine -timolol  (COMBIGAN ) 0.2-0.5 % ophthalmic solution Apply 1 drop to eye 2 (two) times daily.     Brinzolamide -Brimonidine  (SIMBRINZA) 1-0.2 % SUSP Apply 1 drop to eye in the morning and at bedtime. I drop both eyes twice a day     Calcium  Carb-Cholecalciferol 600-10 MG-MCG TABS Take 1 tablet by mouth daily.     calcium  carbonate (CALCIUM  600 HIGH POTENCY) 600 MG TABS tablet Take 2 tablets (1,200 mg total) by mouth 2 (two) times daily. 120 tablet 2   dorzolamide (TRUSOPT) 2 % ophthalmic solution 1 drop 2 (two) times daily.     furosemide  (LASIX ) 40 MG tablet Take 1 tablet (40 mg total) by mouth daily as needed for edema or fluid. 30 tablet 1   levothyroxine  (SYNTHROID ) 150 MCG tablet Take 1 tablet (150 mcg total) by mouth daily. For thyroid  30 tablet 1   lidocaine -prilocaine  (EMLA ) cream Apply 1 application topically as needed. Apply to port and cover with saran wrap 1-2 hours prior to port access 30  g 1   magnesium  oxide (MAG-OX) 400 (240 Mg) MG tablet Take 1 tablet (400 mg total) by mouth 2 (two) times daily. 90 tablet 1   montelukast  (SINGULAIR ) 10 MG tablet Take 1 tablet (10 mg total) by mouth at bedtime. 30 tablet 3   NIFEdipine (ADALAT CC) 30 MG 24 hr tablet Take 30 mg by mouth daily.     pantoprazole  (PROTONIX ) 40 MG tablet Take 1 tablet (40 mg total) by mouth daily. 90 tablet 0   polyethylene glycol (MIRALAX  / GLYCOLAX ) packet Take 17 g by mouth daily as needed for mild constipation. 14 each 0   spironolactone   (ALDACTONE ) 25 MG tablet Take 1 tablet (25 mg total) by mouth daily as needed (edema). 90 tablet 1   Vitamin D , Ergocalciferol , (DRISDOL ) 1.25 MG (50000 UNIT) CAPS capsule Take 1 capsule (50,000 Units total) by mouth once a week. 12 capsule 0   traMADol  (ULTRAM ) 50 MG tablet Take 1 tablet (50 mg total) by mouth every 12 (twelve) hours as needed. 60 tablet 0   No current facility-administered medications for this visit.   Facility-Administered Medications Ordered in Other Visits  Medication Dose Route Frequency Provider Last Rate Last Admin   0.9 %  sodium chloride  infusion   Intravenous Once Felisia Balcom R, MD       0.9 %  sodium chloride  infusion   Intravenous Once Naja Apperson R, MD       acetaminophen  (TYLENOL ) tablet 650 mg  650 mg Oral Once Mirielle Byrum R, MD       carfilzomib  (KYPROLIS ) 100 mg in dextrose  5 % 100 mL chemo infusion  56 mg/m2 (Treatment Plan Recorded) Intravenous Once Jozeph Persing R, MD       dexamethasone  (DECADRON ) injection 5 mg  5 mg Intravenous Once Quida Glasser R, MD       diphenhydrAMINE  (BENADRYL ) injection 50 mg  50 mg Intravenous Once Antonae Zbikowski R, MD       famotidine  (PEPCID ) IVPB 20 mg premix  20 mg Intravenous Once Makennah Omura R, MD       isatuximab -irfc (SARCLISA ) 700 mg in sodium chloride  0.9 % 215 mL (2.8 mg/mL) chemo infusion  10 mg/kg (Treatment Plan Recorded) Intravenous Once Ema Hebner R, MD        PHYSICAL EXAMINATION:   BP 138/76 (BP Location: Left Arm, Patient Position: Sitting)   Pulse 73   Temp 98.4 F (36.9 C) (Tympanic)   Resp 16   Wt 163 lb 8 oz (74.2 kg)   SpO2 99%   BMI 29.90 kg/m   Filed Weights   07/10/24 0928  Weight: 163 lb 8 oz (74.2 kg)    Abdominals abscess-healing/spontaneously drained.SABRA  Physical Exam HENT:     Head: Normocephalic and atraumatic.  Eyes:     Pupils: Pupils are equal, round, and reactive to light.  Cardiovascular:     Rate and Rhythm:  Normal rate and regular rhythm.  Pulmonary:     Effort: Pulmonary effort is normal. No respiratory distress.     Breath sounds: Normal breath sounds. No wheezing.  Abdominal:     General: Bowel sounds are normal. There is no distension.     Palpations: Abdomen is soft. There is no mass.     Tenderness: There is no abdominal tenderness. There is no guarding or rebound.  Musculoskeletal:        General: No tenderness. Normal range of motion.     Cervical back: Normal range of motion and neck  supple.  Skin:    General: Skin is warm.  Neurological:     Mental Status: She is alert and oriented to person, place, and time.  Psychiatric:        Mood and Affect: Affect normal.    LABORATORY DATA:  I have reviewed the data as listed    Component Value Date/Time   NA 144 07/10/2024 0854   NA 141 01/25/2020 1530   K 3.2 (L) 07/10/2024 0854   CL 111 07/10/2024 0854   CO2 20 (L) 07/10/2024 0854   GLUCOSE 107 (H) 07/10/2024 0854   BUN 10 07/10/2024 0854   BUN 19 01/25/2020 1530   CREATININE 0.72 07/10/2024 0854   CALCIUM  8.5 (L) 07/10/2024 0854   PROT 6.7 07/10/2024 0854   ALBUMIN 4.1 07/10/2024 0854   AST 14 (L) 07/10/2024 0854   ALT 13 07/10/2024 0854   ALKPHOS 61 07/10/2024 0854   BILITOT 0.6 07/10/2024 0854   GFRNONAA >60 07/10/2024 0854   GFRAA NOT CALCULATED 04/17/2020 1352    No results found for: SPEP, UPEP  Lab Results  Component Value Date   WBC 3.1 (L) 07/10/2024   NEUTROABS 1.4 (L) 07/10/2024   HGB 11.3 (L) 07/10/2024   HCT 33.6 (L) 07/10/2024   MCV 93.6 07/10/2024   PLT 114 (L) 07/10/2024      Chemistry      Component Value Date/Time   NA 144 07/10/2024 0854   NA 141 01/25/2020 1530   K 3.2 (L) 07/10/2024 0854   CL 111 07/10/2024 0854   CO2 20 (L) 07/10/2024 0854   BUN 10 07/10/2024 0854   BUN 19 01/25/2020 1530   CREATININE 0.72 07/10/2024 0854      Component Value Date/Time   CALCIUM  8.5 (L) 07/10/2024 0854   ALKPHOS 61 07/10/2024 0854   AST  14 (L) 07/10/2024 0854   ALT 13 07/10/2024 0854   BILITOT 0.6 07/10/2024 0854      (H): Data is abnormally high   Latest Reference Range & Units Most Recent 12/24/20 14:01 04/01/21 14:34 04/28/21 09:17 06/11/21 09:42 07/03/21 10:32 08/20/21 10:06  M Protein SerPl Elph-Mcnc Not Observed g/dL 1.1 (H) (C) 8/73/76 89:93 Not Observed (C) 0.6 (H) (C) 0.7 (H) (C) 0.5 (H) (C) 0.7 (H) (C) 1.1 (H) (C)    Latest Reference Range & Units Most Recent 09/17/21 08:47 10/15/21 08:25 11/12/21 08:55  Kappa free light chain 3.3 - 19.4 mg/L 5.0 11/12/21 08:55 7.2 7.2 5.0  Lambda free light chains 5.7 - 26.3 mg/L 223.6 (H) 11/12/21 08:55 215.3 (H) 340.2 (H) 223.6 (H)  Kappa, lambda light chain ratio 0.26 - 1.65  0.02 (L) 11/12/21 08:55 0.03 (L) 0.02 (L) 0.02 (L)  (H): Data is abnormally high (L): Data is abnormally low  RADIOGRAPHIC STUDIES: I have personally reviewed the radiological images as listed and agreed with the findings in the report. No results found.    ASSESSMENT & PLAN:  Multiple myeloma not having achieved remission (HCC)  # RECURRENT Multiple myeloma stage III [high-risk cytogenetics-status post KRD-VGPR ; status post autologous stem cell transplant on 06/15/18.   MAY 2024- 1.3. ]  currently on Isa-Dex; d-2 carfil weekly q 28 days. # MARCH 2025- M-protein -0.3 ; kappa lambda light chain ratio-1.8 lambda light chain 63-slowly RISING FEB 2025-chemotherapy was interrupted-until MAY 2025- because of thigh infection needing wound VAC.    # S/p re-evaluation at [facility] in June 2025- Labs c/w early progression. No evidence of end organ damage-  PET SEP  2025-  No findings of active myeloma.  low-grade activity in a 1.5 cm partially sclerotic lesion in the right distal femoral metaphysis, probably an enchondroma or similar incidental benign lesion. The appearance is not characteristic for myeloma.   # GIVEN SEP 2025-PET NEGATIVITY- For now continue with with  Isa-Dex-Carfil- Labs-CBC/chemistries were  reviewed with the patient. OCT 2025- MM/K-l light chain- RISING.  Will monitor closely-and repeat PET scan/bone marrow biopsy to confirm progression.  If clinical progression noted would recommend evaluation again at [facility] for CAR-T/bispecific's. Discussed with patient and also Dr.gasperetto- awaiting Appt on Jan 18th, 2026.   # LEFT shoulder pain- with limited mobility- refer to [facility]   # DROP in EF:  MARCH 2024-Echo [[facility]] Left ventricular EF 50 to 55%;  ECHO  04/23/2024 50-55%- stable.  Will repeat again in dec-jan 2026- pending today.   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    # Anemia- JAn 2025- I ron sat- 8%; ferritin- 19 on prn venofer - stable.    # Electrolyte: Hypokalemia: mild-  continue compliance Kdur TID-; hypocalcemia- 8.0- stable.    # Iatrogenic hypothyroidism [ Graves/goiter s/p total thyroidectomy;aug,2022 ]-currently on Synthroid  175 mcg/day-; will decrease the dose to 150 -  TSH < 0.01- will repeat- in few weeks.    # Glaucoma BIL- L > R [Dr.King; [facility]] - ? Steroid induced on eye drops. decreased the steroids. Discussed with Dr.Haines, ophthalmology- Glaucoma under control- s/p surgery for retinal detachment-on 11/12. Stable.   # chronic  Lower back discomfort - Continue tramadol - refilled Stable.   # Bone lesions /last zometa  on 11/27/2017.  S/p   Dental extraction- on dentures-  PET scan- NO evidence of any bone lesions. ? Zometa   given risk of ONJ.Stable. .   # Infection prophylaxis: Acyclovir ; asprin- add IVIG infusion [IgG < 400]- plan  IVIG- q 4 w #1 [sep 2025]; Hx  MAY 2025- thigh infection s/p wound VAC [Dr.Rodenberg]  prn  IVIG prn/monthly- NOV IgG- 480- recurrent abdominal abccess- recommend IVIG infusion-   # IV access: mediport- functional  # ACP: Patient has  incurable cancer.  However patient is currently doing well. Patient interested in continue current scope of therapy; and full  code.  6 w- given dental extrac- on 1/26. :MM panel; K/l light chain ratio q 4 W  ; NO zometa - dental issues-ok; but hypocalemia  Starting aug 25th- q2w-MD: D-isa-dex;car1&2-car; weekly x 3; week 4-OFF;  q 28 day cycle- pushed off to 6 weeks- 11/04 PS:  # DISPOSITION: #   refer to [facility] - re: LEFT shoulder pain  # bone marrow biopsy in 2 weeks- please order # chemo today; and tomorrow as planned # IVIG in 1 week- # order standing orders for 4 weeks- MM panel/k-l light chains- # follow up as per IS-  -Dr.B   Orders Placed This Encounter  Procedures   IR BONE MARROW BIOPSY & ASPIRATION    bone marrow biopsy in 2 weeks-    Standing Status:   Future    Expiration Date:   07/10/2025    Reason for Exam (SYMPTOM  OR DIAGNOSIS REQUIRED):   muliple myeloma    Preferred Imaging Location?:   [facility]   Ambulatory referral to Orthopedics    Referral Priority:   Routine    Referral Type:   Consultation    Referral Reason:   Specialty Services Required    Referred to Provider:   [facility]    Number of Visits Requested:   1   All questions were answered. The patient knows to call the clinic with any problems, questions or concerns.      Cindy JONELLE Joe, MD 07/10/2024 10:17 AM End

## 2024-07-10 NOTE — Assessment & Plan Note (Addendum)
°#   RECURRENT Multiple myeloma stage III [high-risk cytogenetics-status post KRD-VGPR ; status post autologous stem cell transplant on 06/15/18.   MAY 2024- 1.3. ]  currently on Isa-Dex; d-2 carfil weekly q 28 days. # MARCH 2025- M-protein -0.3 ; kappa lambda light chain ratio-1.8 lambda light chain 63-slowly RISING FEB 2025-chemotherapy was interrupted-until MAY 2025- because of thigh infection needing wound VAC.    # S/p re-evaluation at [facility] in June 2025- Labs c/w early progression. No evidence of end organ damage-  PET SEP 2025-  No findings of active myeloma.  low-grade activity in a 1.5 cm partially sclerotic lesion in the right distal femoral metaphysis, probably an enchondroma or similar incidental benign lesion. The appearance is not characteristic for myeloma.   # GIVEN SEP 2025-PET NEGATIVITY- For now continue with with  Isa-Dex-Carfil- Labs-CBC/chemistries were reviewed with the patient. OCT 2025- MM/K-l light chain- RISING.  Will monitor closely-and repeat PET scan/bone marrow biopsy to confirm progression.  If clinical progression noted would recommend evaluation again at [facility] for CAR-T/bispecific's. Discussed with patient and also Dr.gasperetto- awaiting Appt on Jan 18th, 2026.   # LEFT shoulder pain- with limited mobility- [facility]   # DROP in EF:  MARCH 2024-Echo [[facility]] Left ventricular EF 50 to 55%;  ECHO  04/23/2024 50-55%- stable.  Will repeat again in dec-jan 2026- pending today.   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    # Anemia- JAn 2025- I ron sat- 8%; ferritin- 19 on prn venofer - stable.    # Electrolyte: Hypokalemia: mild-  continue compliance Kdur TID-; hypocalcemia- 8.0- stable.    # Iatrogenic hypothyroidism [ Graves/goiter s/p total thyroidectomy;aug,2022 ]-currently on Synthroid  175 mcg/day-; will decrease the dose to 150 -  TSH < 0.01- will repeat- in few weeks.    # Glaucoma BIL- L > R [Dr.King;  [facility]] - ? Steroid induced on eye drops. decreased the steroids. Discussed with Dr.Haines, ophthalmology- Glaucoma under control- s/p surgery for retinal detachment-on 11/12. Stable.   # chronic  Lower back discomfort - Continue tramadol - refilled Stable.   # Bone lesions /last zometa  on 11/27/2017.  S/p   Dental extraction- on dentures-  PET scan- NO evidence of any bone lesions. ? Zometa   given risk of ONJ.Stable. .   # Infection prophylaxis: Acyclovir ; asprin- add IVIG infusion [IgG < 400]- plan  IVIG- q 4 w #1 [sep 2025]; Hx  MAY 2025- thigh infection s/p wound VAC [Dr.Rodenberg]  prn  IVIG prn/monthly- NOV IgG- 480- recurrent abdominal abccess- recommend IVIG infusion-   # IV access: mediport- functional  # ACP: Patient has  incurable cancer.  However patient is currently doing well. Patient interested in continue current scope of therapy; and full code.  6 w- given dental extrac- on 1/26. :MM panel; K/l light chain ratio q 4 W  ; NO zometa - dental issues-ok; but hypocalemia  Starting aug 25th- q2w-MD: D-isa-dex;car1&2-car; weekly x 3; week 4-OFF;  q 28 day cycle- pushed off to 6 weeks- 11/04 PS:  # DISPOSITION: #   [facility]er to [facility] - re: LEFT shoulder pain  # bone marrow biopsy in 2 weeks- please order # chemo today; and tomorrow as planned # IVIG in 1 week- # order standing orders for 4 weeks- MM panel/k-l light chains- # follow up as per IS-  -Dr.B

## 2024-07-10 NOTE — Telephone Encounter (Signed)
 Called patient's daughter, Nanetta to set up appt  for Bone Marrow Biopsy . Dec 31 arrival time 7:30 am. Nothing to eat or drink after midnight. She will need a driver.[facility].

## 2024-07-11 ENCOUNTER — Inpatient Hospital Stay

## 2024-07-11 VITALS — BP 112/63 | HR 87 | Temp 99.0°F | Resp 18

## 2024-07-11 DIAGNOSIS — Z5112 Encounter for antineoplastic immunotherapy: Secondary | ICD-10-CM | POA: Diagnosis not present

## 2024-07-11 DIAGNOSIS — C9000 Multiple myeloma not having achieved remission: Secondary | ICD-10-CM

## 2024-07-11 LAB — KAPPA/LAMBDA LIGHT CHAINS
Kappa free light chain: 1.2 mg/L — ABNORMAL LOW (ref 3.3–19.4)
Kappa, lambda light chain ratio: 0.01 — ABNORMAL LOW (ref 0.26–1.65)
Lambda free light chains: 149.7 mg/L — ABNORMAL HIGH (ref 5.7–26.3)

## 2024-07-11 MED ORDER — SODIUM CHLORIDE 0.9 % IV SOLN
Freq: Once | INTRAVENOUS | Status: AC
  Filled 2024-07-11: qty 250

## 2024-07-11 MED ORDER — DEXAMETHASONE SOD PHOSPHATE PF 10 MG/ML IJ SOLN
5.0000 mg | Freq: Once | INTRAMUSCULAR | Status: AC
  Administered 2024-07-11: 13:00:00 5 mg via INTRAVENOUS

## 2024-07-11 MED ORDER — SODIUM CHLORIDE 0.9 % IV SOLN
Freq: Once | INTRAVENOUS | Status: DC
  Filled 2024-07-11: qty 250

## 2024-07-11 MED ORDER — DEXTROSE 5 % IV SOLN
56.0000 mg/m2 | Freq: Once | INTRAVENOUS | Status: AC
  Administered 2024-07-11: 14:00:00 100 mg via INTRAVENOUS
  Filled 2024-07-11: qty 30

## 2024-07-11 NOTE — Patient Instructions (Addendum)
 [facility] - A DEPT OF [facility]  Discharge Instructions: Thank you for choosing [facility] to provide your oncology and hematology care.  If you have a lab appointment with the Cancer Center, please go directly to the Cancer Center and check in at the registration area.  Wear comfortable clothing and clothing appropriate for easy access to any Portacath or PICC line.   We strive to give you quality time with your provider. You may need to reschedule your appointment if you arrive late (15 or more minutes).  Arriving late affects you and other patients whose appointments are after yours.  Also, if you miss three or more appointments without notifying the office, you may be dismissed from the clinic at the provider's discretion.      For prescription refill requests, have your pharmacy contact our office and allow 72 hours for refills to be completed.    Today you received the following chemotherapy and/or immunotherapy agents KYPROLIS      To help prevent nausea and vomiting after your treatment, we encourage you to take your nausea medication as directed.  BELOW ARE SYMPTOMS THAT SHOULD BE REPORTED IMMEDIATELY: *FEVER GREATER THAN 100.4 F (38 C) OR HIGHER *CHILLS OR SWEATING *NAUSEA AND VOMITING THAT IS NOT CONTROLLED WITH YOUR NAUSEA MEDICATION *UNUSUAL SHORTNESS OF BREATH *UNUSUAL BRUISING OR BLEEDING *URINARY PROBLEMS (pain or burning when urinating, or frequent urination) *BOWEL PROBLEMS (unusual diarrhea, constipation, pain near the anus) TENDERNESS IN MOUTH AND THROAT WITH OR WITHOUT PRESENCE OF ULCERS (sore throat, sores in mouth, or a toothache) UNUSUAL RASH, SWELLING OR PAIN  UNUSUAL VAGINAL DISCHARGE OR ITCHING   Items with * indicate a potential emergency and should be followed up as soon as possible or go to the Emergency Department if any problems should occur.  Please show the CHEMOTHERAPY ALERT CARD or IMMUNOTHERAPY  ALERT CARD at check-in to the Emergency Department and triage nurse.  Should you have questions after your visit or need to cancel or reschedule your appointment, please contact [facility] - A DEPT OF [facility]  [phone] and follow the prompts.  Office hours are 8:00 a.m. to 4:30 p.m. Monday - Friday. Please note that voicemails left after 4:00 p.m. may not be returned until the following business day.  We are closed weekends and major holidays. You have access to a nurse at all times for urgent questions. Please call the main number to the clinic [phone] and follow the prompts.  For any non-urgent questions, you may also contact your provider using MyChart. We now offer e-Visits for anyone 36 and older to request care online for non-urgent symptoms. For details visit [email or web address].   Also download the MyChart app! Go to the app store, search "MyChart", open the app, select [place], and log in with your MyChart username and password.   Carfilzomib Injection What is this medication? CARFILZOMIB (kar FILZ oh mib) treats multiple myeloma, a type of bone marrow cancer. It works by blocking a protein that causes cancer cells to grow and multiply. This helps to slow or stop the spread of cancer cells. This medicine may be used for other purposes; ask your health care provider or pharmacist if you have questions. COMMON BRAND NAME(S): KYPROLIS What should I tell my care team before I take this medication? They need to know if you have any of these conditions: Heart disease History of blood clots  Irregular heartbeat Kidney disease Liver disease Lung or breathing disease An unusual or allergic reaction to carfilzomib, or other medications, foods, dyes, or preservatives If you or your partner are pregnant or trying to get pregnant Breastfeeding How should I use this medication? This medication is injected into a vein. It is given by your  care team in a hospital or clinic setting. Talk to your care team about the use of this medication in children. Special care may be needed. Overdosage: If you think you have taken too much of this medicine contact a poison control center or emergency room at once. NOTE: This medicine is only for you. Do not share this medicine with others. What if I miss a dose? Keep appointments for follow-up doses. It is important not to miss your dose. Call your care team if you are unable to keep an appointment. What may interact with this medication? Interactions are not expected. This list may not describe all possible interactions. Give your health care provider a list of all the medicines, herbs, non-prescription drugs, or dietary supplements you use. Also tell them if you smoke, drink alcohol, or use illegal drugs. Some items may interact with your medicine. What should I watch for while using this medication? Your condition will be monitored carefully while you are receiving this medication. You may need blood work while taking this medication. Check with your care team if you have severe diarrhea, nausea, and vomiting, or if you sweat a lot. The loss of too much body fluid may make it dangerous for you to take this medication. This medication may affect your coordination, reaction time, or judgment. Do not drive or operate machinery until you know how this medication affects you. Sit up or stand slowly to reduce the risk of dizzy or fainting spells. Drinking alcohol with this medication can increase the risk of these side effects. Talk to your care team if you may be pregnant. Serious birth defects can occur if you take this medication during pregnancy and for 6 months after the last dose. You will need a negative pregnancy test before starting this medication. Contraception is recommended while taking this medication and for 6 months after the last dose. Your care team can help you find an option that works  for you. If your partner can get pregnant, use a condom during sex while taking this medication and for 3 months after the last dose. Do not breastfeed while taking this medication and for 2 weeks after the last dose. This medication may cause infertility. Talk to your care team if you are concerned about your fertility. What side effects may I notice from receiving this medication? Side effects that you should report to your care team as soon as possible: Allergic reactions--skin rash, itching, hives, swelling of the face, lips, tongue, or throat Bleeding--bloody or black, tar-like stools, vomiting blood or brown material that looks like coffee grounds, red or dark brown urine, small red or purple spots on skin, unusual bruising or bleeding Blood clot--pain, swelling, or warmth in the leg, shortness of breath, chest pain Dizziness, loss of balance or coordination, confusion or trouble speaking Heart attack--pain or tightness in the chest, shoulders, arms, or jaw, nausea, shortness of breath, cold or clammy skin, feeling faint or lightheaded Heart failure--shortness of breath, swelling of the ankles, feet, or hands, sudden weight gain, unusual weakness or fatigue Heart rhythm changes--fast or irregular heartbeat, dizziness, feeling faint or lightheaded, chest pain, trouble breathing Increase in blood pressure Infection--fever, chills,  cough, sore throat, wounds that don't heal, pain or trouble when passing urine, general feeling of discomfort or being unwell Infusion reactions--chest pain, shortness of breath or trouble breathing, feeling faint or lightheaded Kidney injury--decrease in the amount of urine, swelling of the ankles, hands, or feet Liver injury--right upper belly pain, loss of appetite, nausea, light-colored stool, dark yellow or brown urine, yellowing skin or eyes, unusual weakness or fatigue Lung injury--shortness of breath or trouble breathing, cough, spitting up blood, chest pain,  fever Pulmonary hypertension--shortness of breath, chest pain, fast or irregular heartbeat, feeling faint or lightheaded, fatigue, swelling of the ankles or feet Stomach pain, bloody diarrhea, pale skin, unusual weakness or fatigue, decrease in the amount of urine, which may be signs of hemolytic uremic syndrome Sudden and severe headache, confusion, change in vision, seizures, which may be signs of posterior reversible encephalopathy syndrome (PRES) TTP--purple spots on the skin or inside the mouth, pale skin, yellowing skin or eyes, unusual weakness or fatigue, fever, fast or irregular heartbeat, confusion, change in vision, trouble speaking, trouble walking Tumor lysis syndrome (TLS)--nausea, vomiting, diarrhea, decrease in the amount of urine, dark urine, unusual weakness or fatigue, confusion, muscle pain or cramps, fast or irregular heartbeat, joint pain Side effects that usually do not require medical attention (report to your care team if they continue or are bothersome): Diarrhea Fatigue Nausea Trouble sleeping This list may not describe all possible side effects. Call your doctor for medical advice about side effects. You may report side effects to FDA at 1-800-FDA-1088. Where should I keep my medication? This medication is given in a hospital or clinic. It will not be stored at home. NOTE: This sheet is a summary. It may not cover all possible information. If you have questions about this medicine, talk to your doctor, pharmacist, or health care provider.  2024 Elsevier/Gold Standard (2021-12-10 00:00:00)

## 2024-07-12 ENCOUNTER — Inpatient Hospital Stay

## 2024-07-12 VITALS — BP 122/64 | HR 62 | Temp 99.1°F | Resp 16 | Wt 164.2 lb

## 2024-07-12 DIAGNOSIS — C9000 Multiple myeloma not having achieved remission: Secondary | ICD-10-CM

## 2024-07-12 DIAGNOSIS — Z5112 Encounter for antineoplastic immunotherapy: Secondary | ICD-10-CM | POA: Diagnosis not present

## 2024-07-12 LAB — MULTIPLE MYELOMA PANEL, SERUM
Albumin SerPl Elph-Mcnc: 3.6 g/dL (ref 2.9–4.4)
Albumin/Glob SerPl: 1.3 (ref 0.7–1.7)
Alpha 1: 0.1 g/dL (ref 0.0–0.4)
Alpha2 Glob SerPl Elph-Mcnc: 0.6 g/dL (ref 0.4–1.0)
B-Globulin SerPl Elph-Mcnc: 1.2 g/dL (ref 0.7–1.3)
Gamma Glob SerPl Elph-Mcnc: 0.8 g/dL (ref 0.4–1.8)
Globulin, Total: 2.8 g/dL (ref 2.2–3.9)
IgA: 427 mg/dL — ABNORMAL HIGH (ref 64–422)
IgG (Immunoglobin G), Serum: 475 mg/dL — ABNORMAL LOW (ref 586–1602)
IgM (Immunoglobulin M), Srm: <5 mg/dL — ABNORMAL LOW (ref 26–217)
M Protein SerPl Elph-Mcnc: 0.4 g/dL — ABNORMAL HIGH
Total Protein ELP: 6.4 g/dL (ref 6.0–8.5)

## 2024-07-12 MED ORDER — IMMUNE GLOBULIN (HUMAN) 10 GM/100ML IV SOLN
25.0000 g | Freq: Once | INTRAVENOUS | Status: AC
  Administered 2024-07-12: 09:00:00 25 g via INTRAVENOUS
  Filled 2024-07-12: qty 200

## 2024-07-12 MED ORDER — DEXTROSE 5 % IV SOLN
Freq: Once | INTRAVENOUS | Status: AC
  Filled 2024-07-12: qty 250

## 2024-07-12 MED ORDER — DIPHENHYDRAMINE HCL 25 MG PO TABS
25.0000 mg | ORAL_TABLET | Freq: Once | ORAL | Status: AC
  Administered 2024-07-12: 09:00:00 25 mg via ORAL
  Filled 2024-07-12: qty 1

## 2024-07-12 MED ORDER — ACETAMINOPHEN 325 MG PO TABS
650.0000 mg | ORAL_TABLET | Freq: Once | ORAL | Status: AC
  Administered 2024-07-12: 09:00:00 650 mg via ORAL
  Filled 2024-07-12: qty 2

## 2024-07-12 NOTE — Patient Instructions (Signed)
 Immune Globulin  Injection What is this medication? IMMUNE GLOBULIN  (im MUNE GLOB yoo lin) treats many immune system conditions. It works by Designer, multimedia extra antibodies. Antibodies are proteins made by the immune system that help protect the body. This medicine may be used for other purposes; ask your health care provider or pharmacist if you have questions. COMMON BRAND NAME(S): ASCENIV, Baygam, BIVIGAM, Carimune, Carimune NF, cutaquig, Cuvitru, Flebogamma, Flebogamma DIF, GamaSTAN, GamaSTAN S/D, Gamimune N, Gammagard, Gammagard S/D, Gammaked, Gammaplex, Gammar-P IV, Gamunex, Gamunex-C, Hizentra, Iveegam, Iveegam EN, Octagam, Panglobulin, Panglobulin NF, panzyga, Polygam S/D, Privigen , Sandoglobulin, Venoglobulin-S, Vigam, Vivaglobulin, Xembify What should I tell my care team before I take this medication? They need to know if you have any of these conditions: Blood clotting disorder Condition where you have excess fluid in your body, such as heart failure or edema Dehydration Diabetes Have had blood clots Heart disease Immune system conditions Kidney disease Low levels of IgA Recent or upcoming vaccine An unusual or allergic reaction to immune globulin , other medications, foods, dyes, or preservatives Pregnant or trying to get pregnant Breastfeeding How should I use this medication? This medication is infused into a vein or under the skin. It may also be injected into a muscle. It is usually given by your care team in a hospital or clinic setting. It may also be given at home. If you get this medication at home, you will be taught how to prepare and give it. Take it as directed on the prescription label. Keep taking it unless your care team tells you to stop. It is important that you put your used needles and syringes in a special sharps container. Do not put them in a trash can. If you do not have a sharps container, call your pharmacist or care team to get one. Talk to your care team  about the use of this medication in children. While it may be given to children for selected conditions, precautions do apply. Overdosage: If you think you have taken too much of this medicine contact a poison control center or emergency room at once. NOTE: This medicine is only for you. Do not share this medicine with others. What if I miss a dose? If you get this medication at the hospital or clinic: It is important not to miss your dose. Call your care team if you are unable to keep an appointment. If you give yourself this medication at home: If you miss a dose, take it as soon as you can. Then continue your normal schedule. If it is almost time for your next dose, take only that dose. Do not take double or extra doses. Call your care team with questions. What may interact with this medication? Live virus vaccines This list may not describe all possible interactions. Give your health care provider a list of all the medicines, herbs, non-prescription drugs, or dietary supplements you use. Also tell them if you smoke, drink alcohol , or use illegal drugs. Some items may interact with your medicine. What should I watch for while using this medication? Your condition will be monitored carefully while you are receiving this medication. Tell your care team if your symptoms do not start to get better or if they get worse. You may need blood work done while you are taking this medication. This medication increases the risk of blood clots. People with heart, blood vessel, or blood clotting conditions are more likely to develop a blood clot. Other risk factors include advanced age, estrogen use, tobacco  use, lack of movement, and being overweight. This medication can decrease the response to a vaccine. If you need to get vaccinated, tell your care team if you have received this medication within the last year. Extra booster doses may be needed. Talk to your care team to see if a different vaccination schedule  is needed. This medication is made from donated human blood. There is a small risk it may contain bacteria or viruses, such as hepatitis or HIV. All products are processed to kill most bacteria and viruses. Talk to your care team if you have questions about the risk of infection. If you have diabetes, talk to your care team about which device you should use to check your blood sugar. This medication may cause some devices to report falsely high blood sugar levels. This may cause you to react by not treating a low blood sugar level or by giving an insulin  dose that was not needed. This can cause severe low blood sugar levels. What side effects may I notice from receiving this medication? Side effects that you should report to your care team as soon as possible: Allergic reactions--skin rash, itching, hives, swelling of the face, lips, tongue, or throat Blood clot--pain, swelling, or warmth in the leg, shortness of breath, chest pain Fever, neck pain or stiffness, sensitivity to light, headache, nausea, vomiting, confusion, which may be signs of meningitis Hemolytic anemia--unusual weakness or fatigue, dizziness, headache, trouble breathing, dark urine, yellowing skin or eyes Kidney injury--decrease in the amount of urine, swelling of the ankles, hands, or feet Low sodium level--muscle weakness, fatigue, dizziness, headache, confusion Shortness of breath or trouble breathing, cough, unusual weakness or fatigue, blue skin or lips Side effects that usually do not require medical attention (report these to your care team if they continue or are bothersome): Chills Diarrhea Fever Headache Nausea This list may not describe all possible side effects. Call your doctor for medical advice about side effects. You may report side effects to FDA at 1-800-FDA-1088. Where should I keep my medication? Keep out of the reach of children and pets. You will be instructed on how to store this medication. Get rid of  any unused medication after the expiration date. To get rid of medications that are no longer needed or have expired: Take the medication to a medication take-back program. Check with your pharmacy or law enforcement to find a location. If you cannot return the medication, ask your pharmacist or care team how to get rid of this medication safely. NOTE: This sheet is a summary. It may not cover all possible information. If you have questions about this medicine, talk to your doctor, pharmacist, or health care provider.  2025 Elsevier/Gold Standard (2023-09-26 00:00:00)

## 2024-07-23 ENCOUNTER — Other Ambulatory Visit: Payer: Self-pay

## 2024-07-23 DIAGNOSIS — Z01818 Encounter for other preprocedural examination: Secondary | ICD-10-CM

## 2024-07-24 ENCOUNTER — Inpatient Hospital Stay

## 2024-07-24 ENCOUNTER — Encounter: Payer: Self-pay | Admitting: Internal Medicine

## 2024-07-24 ENCOUNTER — Telehealth: Payer: Self-pay

## 2024-07-24 ENCOUNTER — Inpatient Hospital Stay (HOSPITAL_BASED_OUTPATIENT_CLINIC_OR_DEPARTMENT_OTHER): Admitting: Internal Medicine

## 2024-07-24 VITALS — BP 137/73 | HR 61 | Temp 97.9°F | Resp 16 | Ht 62.0 in | Wt 161.4 lb

## 2024-07-24 DIAGNOSIS — Z5112 Encounter for antineoplastic immunotherapy: Secondary | ICD-10-CM | POA: Diagnosis not present

## 2024-07-24 DIAGNOSIS — C9000 Multiple myeloma not having achieved remission: Secondary | ICD-10-CM | POA: Diagnosis not present

## 2024-07-24 LAB — CBC WITH DIFFERENTIAL (CANCER CENTER ONLY)
Abs Immature Granulocytes: 0.01 K/uL (ref 0.00–0.07)
Basophils Absolute: 0 K/uL (ref 0.0–0.1)
Basophils Relative: 1 %
Eosinophils Absolute: 0 K/uL (ref 0.0–0.5)
Eosinophils Relative: 1 %
HCT: 37 % (ref 36.0–46.0)
Hemoglobin: 12 g/dL (ref 12.0–15.0)
Immature Granulocytes: 0 %
Lymphocytes Relative: 57 %
Lymphs Abs: 1.7 K/uL (ref 0.7–4.0)
MCH: 31.2 pg (ref 26.0–34.0)
MCHC: 32.4 g/dL (ref 30.0–36.0)
MCV: 96.1 fL (ref 80.0–100.0)
Monocytes Absolute: 0.4 K/uL (ref 0.1–1.0)
Monocytes Relative: 12 %
Neutro Abs: 0.9 K/uL — ABNORMAL LOW (ref 1.7–7.7)
Neutrophils Relative %: 29 %
Platelet Count: 207 K/uL (ref 150–400)
RBC: 3.85 MIL/uL — ABNORMAL LOW (ref 3.87–5.11)
RDW: 13.9 % (ref 11.5–15.5)
WBC Count: 3 K/uL — ABNORMAL LOW (ref 4.0–10.5)
nRBC: 0 % (ref 0.0–0.2)

## 2024-07-24 LAB — CMP (CANCER CENTER ONLY)
ALT: 9 U/L (ref 0–44)
AST: 18 U/L (ref 15–41)
Albumin: 4.3 g/dL (ref 3.5–5.0)
Alkaline Phosphatase: 66 U/L (ref 38–126)
Anion gap: 13 (ref 5–15)
BUN: 16 mg/dL (ref 8–23)
CO2: 22 mmol/L (ref 22–32)
Calcium: 8.6 mg/dL — ABNORMAL LOW (ref 8.9–10.3)
Chloride: 106 mmol/L (ref 98–111)
Creatinine: 0.86 mg/dL (ref 0.44–1.00)
GFR, Estimated: >60 mL/min
Glucose, Bld: 91 mg/dL (ref 70–99)
Potassium: 3.5 mmol/L (ref 3.5–5.1)
Sodium: 141 mmol/L (ref 135–145)
Total Bilirubin: 0.3 mg/dL (ref 0.0–1.2)
Total Protein: 7.6 g/dL (ref 6.5–8.1)

## 2024-07-24 MED ORDER — DEXTROSE 5 % IV SOLN
56.0000 mg/m2 | Freq: Once | INTRAVENOUS | Status: AC
  Administered 2024-07-24: 100 mg via INTRAVENOUS
  Filled 2024-07-24: qty 30

## 2024-07-24 MED ORDER — NIFEDIPINE ER 30 MG PO TB24: 30.0000 mg | ORAL_TABLET | Freq: Every day | ORAL | 1 refills | Status: AC

## 2024-07-24 MED ORDER — DEXAMETHASONE SOD PHOSPHATE PF 10 MG/ML IJ SOLN
5.0000 mg | Freq: Once | INTRAMUSCULAR | Status: AC
  Administered 2024-07-24: 5 mg via INTRAVENOUS

## 2024-07-24 MED ORDER — ACETAMINOPHEN 325 MG PO TABS
650.0000 mg | ORAL_TABLET | Freq: Once | ORAL | Status: AC
  Administered 2024-07-24: 650 mg via ORAL
  Filled 2024-07-24: qty 2

## 2024-07-24 MED ORDER — SODIUM CHLORIDE 0.9 % IV SOLN
Freq: Once | INTRAVENOUS | Status: AC
  Filled 2024-07-24: qty 250

## 2024-07-24 MED ORDER — SODIUM CHLORIDE 0.9 % IV SOLN
10.0000 mg/kg | Freq: Once | INTRAVENOUS | Status: AC
  Administered 2024-07-24: 700 mg via INTRAVENOUS
  Filled 2024-07-24: qty 25

## 2024-07-24 MED ORDER — FAMOTIDINE IN NACL 20-0.9 MG/50ML-% IV SOLN
20.0000 mg | Freq: Once | INTRAVENOUS | Status: AC
  Administered 2024-07-24: 20 mg via INTRAVENOUS
  Filled 2024-07-24: qty 50

## 2024-07-24 MED ORDER — DIPHENHYDRAMINE HCL 50 MG/ML IJ SOLN
50.0000 mg | Freq: Once | INTRAMUSCULAR | Status: AC
  Administered 2024-07-24: 50 mg via INTRAVENOUS
  Filled 2024-07-24: qty 1

## 2024-07-24 NOTE — Progress Notes (Signed)
 Ok per MD to treat with ANC 0.9

## 2024-07-24 NOTE — Telephone Encounter (Signed)
 Patient for IR Bone Marrow Biopsy on Friday 07/27/24, I called and LVM for the patient on the phone and gave pre-procedure instructions. VM made the patient aware to be here at 7:30a, NPO after MN prior to procedure as well as driver post procedure/recovery/discharge. Called 07/24/24

## 2024-07-24 NOTE — Patient Instructions (Signed)
 [facility] - A DEPT OF [facility]  Discharge Instructions: Thank you for choosing [facility] to provide your oncology and hematology care.  If you have a lab appointment with the Cancer Center, please go directly to the Cancer Center and check in at the registration area.  Wear comfortable clothing and clothing appropriate for easy access to any Portacath or PICC line.   We strive to give you quality time with your provider. You may need to reschedule your appointment if you arrive late (15 or more minutes).  Arriving late affects you and other patients whose appointments are after yours.  Also, if you miss three or more appointments without notifying the office, you may be dismissed from the clinic at the providers discretion.      For prescription refill requests, have your pharmacy contact our office and allow 72 hours for refills to be completed.    Today you received the following chemotherapy and/or immunotherapy agents : carfilzomib / isatuximab    To help prevent nausea and vomiting after your treatment, we encourage you to take your nausea medication as directed.  BELOW ARE SYMPTOMS THAT SHOULD BE REPORTED IMMEDIATELY: *FEVER GREATER THAN 100.4 F (38 C) OR HIGHER *CHILLS OR SWEATING *NAUSEA AND VOMITING THAT IS NOT CONTROLLED WITH YOUR NAUSEA MEDICATION *UNUSUAL SHORTNESS OF BREATH *UNUSUAL BRUISING OR BLEEDING *URINARY PROBLEMS (pain or burning when urinating, or frequent urination) *BOWEL PROBLEMS (unusual diarrhea, constipation, pain near the anus) TENDERNESS IN MOUTH AND THROAT WITH OR WITHOUT PRESENCE OF ULCERS (sore throat, sores in mouth, or a toothache) UNUSUAL RASH, SWELLING OR PAIN  UNUSUAL VAGINAL DISCHARGE OR ITCHING   Items with * indicate a potential emergency and should be followed up as soon as possible or go to the Emergency Department if any problems should occur.  Please show the CHEMOTHERAPY ALERT CARD or  IMMUNOTHERAPY ALERT CARD at check-in to the Emergency Department and triage nurse.  Should you have questions after your visit or need to cancel or reschedule your appointment, please contact [facility] - A DEPT OF [facility]  [phone] and follow the prompts.  Office hours are 8:00 a.m. to 4:30 p.m. Monday - Friday. Please note that voicemails left after 4:00 p.m. may not be returned until the following business day.  We are closed weekends and major holidays. You have access to a nurse at all times for urgent questions. Please call the main number to the clinic [phone] and follow the prompts.  For any non-urgent questions, you may also contact your provider using MyChart. We now offer e-Visits for anyone 31 and older to request care online for non-urgent symptoms. For details visit [email or web address].   Also download the MyChart app! Go to the app store, search MyChart, open the app, select [place], and log in with your MyChart username and password.

## 2024-07-24 NOTE — Assessment & Plan Note (Addendum)
" °#   RECURRENT Multiple myeloma stage III [high-risk cytogenetics-status post KRD-VGPR ; status post autologous stem cell transplant on 06/15/18.   MAY 2024- 1.3. ]  currently on Isa-Dex; d-2 carfil weekly q 28 days. # MARCH 2025- M-protein -0.3 ; kappa lambda light chain ratio-1.8 lambda light chain 63-slowly RISING FEB 2025-chemotherapy was interrupted-until MAY 2025- because of thigh infection needing wound VAC.    # S/p re-evaluation at [facility] in June 2025- Labs c/w early progression. No evidence of end organ damage-  PET SEP 2025-  No findings of active myeloma.  low-grade activity in a 1.5 cm partially sclerotic lesion in the right distal femoral metaphysis, probably an enchondroma or similar incidental benign lesion. The appearance is not characteristic for myeloma.   # GIVEN SEP 2025-PET NEGATIVITY- For now continue with with  Isa-Dex-Carfil- Labs-CBC/chemistries were reviewed with the patient. OCT 2025- MM/K-l light chain- RISING.  Will monitor closely- bone marrow biopsy-pending on 07/27/2024 to confirm progression.  If clinical progression noted would recommend evaluation again at [facility] for CAR-T/bispecific's. Discussed with patient and also Dr.gasperetto- awaiting Appt on Jan 18th, 2026. Echo on 1/05-Pending.   # LEFT shoulder pain- with limited mobility-awaiting evaluation [facility]   # DROP in EF:  MARCH 2024-Echo [[facility]] Left ventricular EF 50 to 55%;  ECHO  04/23/2024 50-55%- stable.  Will repeat again in dec-jan 2026- pending today.   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    # Anemia- JAn 2025- I ron sat- 8%; ferritin- 19 on prn venofer - stable.    # Electrolyte: Hypokalemia: mild-  continue compliance Kdur TID-; hypocalcemia- 8.0- ? Causing cramps- recommend ca OTC BID   # Iatrogenic hypothyroidism [ Graves/goiter s/p total thyroidectomy;aug,2022 ]-currently on Synthroid  175 mcg/day-; will decrease the dose to 150 -  TSH < 0.01-  will repeat- in few weeks.   # Glaucoma BIL- L > R [Dr.King; [facility]] - ? Steroid induced on eye drops. decreased the steroids. Discussed with Dr.Haines, ophthalmology- Glaucoma under control- s/p surgery for retinal detachment-on 11/12. Stable.   # chronic  Lower back discomfort - Continue tramadol - refilled Stable.   # Bone lesions /last zometa  on 11/27/2017.  S/p   Dental extraction- on dentures-  PET scan- NO evidence of any bone lesions. ? Zometa   given risk of ONJ.Stable. .   # Infection prophylaxis: Acyclovir ; asprin- add IVIG infusion [IgG < 400]- plan  IVIG- q 4 w #1 [sep 2025]; Hx  MAY 2025- thigh infection s/p wound VAC [Dr.Rodenberg]  prn  IVIG prn/monthly- NOV IgG- 480- recurrent abdominal abccess- Continue IVIG infusion-   # IV access: mediport- functional  # ACP:  interested in continue current scope of therapy; and full code.  6 w- given dental extrac- on 1/26. :MM panel; K/l light chain ratio q 4 W  ; NO zometa - dental issues-ok; but hypocalemia  Starting aug 25th- q2w-MD: D-isa-dex;car1&2-car; weekly x 3; week 4-OFF;  q 28 day cycle- pushed off to 6 weeks- 11/04; IVIG q M- - 12/18- PS:  # DISPOSITION: # chemo today; and tomorrow as planned # IVIG in 3 weeks- # order standing orders for 4 weeks- MM panel/k-l light chains- # follow up as per IS-  -Dr.B "

## 2024-07-24 NOTE — Progress Notes (Signed)
 C/o getting cramps in her arms, legs and chest x3 days.  Needs refill on nifedipine, pended.

## 2024-07-24 NOTE — Progress Notes (Signed)
 [facility] OFFICE PROGRESS NOTE  Patient Care Team: Buren Rock HERO, MD as PCP - General (Family Medicine) End, Lonni, MD as PCP - Cardiology (Cardiology) Guinevere File, [name] Rennie Cindy SAUNDERS, MD as Consulting Physician (Oncology) Parris Manna, MD as Consulting Physician (Pulmonary Disease) Damian Therisa HERO, MD as Consulting Physician (Endocrinology)   Cancer Staging  Multiple myeloma not having achieved remission [facility]) Staging form: Plasma Cell Myeloma and Plasma Cell Disorders, AJCC 8th Edition - Clinical: No stage assigned - Unsigned - Clinical: No stage assigned - Unsigned   Oncology History Overview Note  # SEP 2018- MULTIPLE MYELOMA IgALamda [2.5 gm/dl; K/L= 05/8700]; STAGE III [beta 2 microglobulin=5.5] [presented with acute renal failure; anemia; NO hypercalcemia; Skeletal survey-Normal]; BMBx- 45% plasma cells; FISH-POSITIVE 11:14 translocation.[STANDARD-high RISK]/cyto-Normal; SEP 2018- PET- L3 posterior element lesion.   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 No morphologic support for residual myeloma disease. Negative for minimal residual disease by MM-MRD flow cytometry; HOLD REVLIMID  [leg swelling]; AUG 2021-PET scan negative for myeloma; continue to hold Revlimid   # MARCH 2021-diastolic congestive heart failure [Dr.End]  # BMBx- OCT 2021- BMBx- 5% plasmacytosis-however this appears to be more polyclonal  rather than monoclonal-not explain patient worsening anemia.   # OCT 2022- 18th- Dara- Rev-Dex; MARCH, 2024- PROGRESSION bone marrow biopsy 70% plasma cells; PET scan no bone lesions-however conus medullaris uptake-MRI lumbar spine-   # MAY 2024- DISCONTINUE carfilzomib -dexamethasone - Pomalyst ; [rising M-protein; MAY 2024- 1.3. ]   # start 12/27/2022 -Isa-Carfil-Dex;  [decrease dexamethasone  20 weekly- starting 05/04/23- sec to glaucoma].   # OCT 24th, 2024- Bone marrow Biopsy: The findings are consistent with  focal minimal residual lambda restricted plasma cell neoplasm- HOLD treatment sec to glaucoma; and drop in EF AUG 2024- 45-50% [March 2024- 50-55%]  # MARCH 16th, 2025- re-started Isa-Carfil-Dex;  #November 2021-hyperthyroidism/goiter- [D.Bennett/Dr.Solum]-methimazole . S/p Thyroidectomy [Dr.Kim; [facility]-AUG 2022]  --------------------------  # 12/12- RIGHT JUGULAR DVT-x 66m on xarelto ; finished April 2020; September 2020-EGD/dysphagia; Dr. Therisa  # Acute renal failure [Dr.Singh; Proteinuria 1.5gm/day ]; Elza Mickeal ------------------------------------------------------------------------------------------------------------   DIAGNOSIS: [ ]  MULTIPLE MYELOMA  STAGE: III/HIGH RISK ;GOALS: CONTROL      Multiple myeloma not having achieved remission (HCC)  11/21/2017 - 04/28/2018 Chemotherapy   Patient is on Treatment Plan : MYELOMA SALVAGE Cyclophosphamide  / Carfilzomib  / Dexamethasone  (CCd) q28d     05/12/2021 - 02/25/2022 Chemotherapy   Patient is on Treatment Plan : MYELOMA RELAPSED REFRACTORY Daratumumab  SQ + Lenalidomide  + Dexamethasone  (DaraRd) q28d     05/12/2021 - 07/23/2022 Chemotherapy   Patient is on Treatment Plan : MYELOMA RELAPSED REFRACTORY Daratumumab  SQ + Lenalidomide  + Dexamethasone  (DaraRd) q28d     11/02/2022 - 11/16/2022 Chemotherapy   Patient is on Treatment Plan : MYELOMA RELAPSED/ REFRACTORY Carfilzomib  D1,8,15 (20/27) + Pomalidomide  + Dexamethasone  (KPd) q28d      12/27/2022 -  Chemotherapy   Patient is on Treatment Plan : MYELOMA Carfilzomib  + Dexamethasone  + Isatuximab  q28d      INTERVAL HISTORY: Ambulating  independently.  Alone.  Brandi  E [name] 73 y.o.  female pleasant patient above history of relapsed multiple myeloma--currently re-started back after 3 months break [JULY 28th, 2025]  Isa- Carfil-Dex  is here for a follow up.   Discussed the use of AI scribe software for clinical note transcription with the patient, who gave verbal consent to proceed.  History of Present Illness   Brandi [name] is a 73 year old female with multiple myeloma not in remission, currently receiving chemotherapy, who presents for hematology-oncology follow-up to address disease status and infection risk.  Discussed the use of AI scribe software for clinical note transcription with the patient, who gave verbal consent to proceed.  History of Present Illness   Brandi [name] is a 73 year old female with active multiple myeloma presenting for follow-up to assess disease status and management of treatment-related symptoms.  She is currently receiving therapy for multiple myeloma. Recent laboratory studies have shown changes in her disease markers. She received intravenous immunoglobulin infusion on July 12, 2024. She is scheduled for a bone marrow biopsy on July 27, 2024, and an echocardiogram on July 30, 2024. She has an upcoming appointment at [facility] on August 15, 2024. She has not yet seen the orthopedic specialist due to scheduling conflicts with infusion appointments and plans to reschedule after the holidays.  Since her last visit, she has experienced muscle cramps involving her hands, arms, chest, and legs. She is not currently taking calcium  supplementation. She denies new visual symptoms and reports her eyes are stable. She denies current infections, and a previous infection has resolved.      Review of Systems  Constitutional:  Positive for  malaise/fatigue. Negative for chills, diaphoresis and fever.  HENT:  Negative for nosebleeds and sore throat.   Eyes:  Negative for double vision.  Respiratory:  Negative for hemoptysis.   Cardiovascular:  Negative for chest pain, palpitations and orthopnea.  Gastrointestinal:  Negative for abdominal pain, blood in stool, constipation, heartburn and melena.  Genitourinary:  Negative for dysuria, frequency and urgency.  Musculoskeletal:  Positive for back pain.  Skin: Negative.  Negative for itching and rash.  Neurological:  Negative for dizziness, tingling, focal weakness, weakness and headaches.  Endo/Heme/Allergies:  Does not bruise/bleed easily.  Psychiatric/Behavioral:  Negative for depression. The patient is not nervous/anxious.     PAST MEDICAL HISTORY :  Past Medical History:  Diagnosis Date   (HFpEF) heart failure with preserved ejection fraction (HCC)    a. 06/2020 Echo: EF 60-65%, no rwma, nl RV size/fxn. Triv MR. Triv TR/AI. Mod elev PASP.   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   Open wound of left thigh 12/15/2023   Pneumonia    Red blood cell antibody positive, compatible PRBC difficult to obtain    S/P autologous bone marrow transplantation (HCC)    Sepsis (HCC) 11/25/2023    PAST SURGICAL HISTORY :   Past Surgical History:  Procedure Laterality Date   ANTERIOR VITRECTOMY Left 10/07/2021   Procedure: ANTERIOR VITRECTOMY;  Surgeon: Mittie Gaskin, MD;  Location: [facility];  Service: Ophthalmology;  Laterality: Left;   CATARACT EXTRACTION W/PHACO Left 10/07/2021   Procedure: CATARACT EXTRACTION PHACO AND INTRAOCULAR LENS PLACEMENT (IOC) LEFT VISION BLUE;  Surgeon: Mittie,  Dene, MD;  Location: [facility];  Service: Ophthalmology;  Laterality: Left;  kit for manual incision 14.56 01:21.4   CATARACT EXTRACTION W/PHACO Right 10/21/2021   Procedure: CATARACT EXTRACTION PHACO AND INTRAOCULAR LENS PLACEMENT (IOC) RIGHT 11.12 01:48.1;  Surgeon: Mittie Dene, MD;  Location: [facility];  Service: Ophthalmology;  Laterality: Right;   ESOPHAGOGASTRODUODENOSCOPY (EGD) WITH PROPOFOL  N/A 03/30/2019   Procedure: ESOPHAGOGASTRODUODENOSCOPY (EGD) WITH PROPOFOL ;  Surgeon: Therisa Bi, MD;  Location: [facility];  Service: Gastroenterology;  Laterality: N/A;   INCISION AND DRAINAGE ABSCESS Left 12/01/2023   Procedure: INCISION AND DRAINAGE, ABSCESS;  Surgeon: Lane Shope, MD;  Location: [facility];  Service: General;  Laterality: Left;   IR BONE MARROW BIOPSY & ASPIRATION  05/25/2023   IR FLUORO GUIDE PORT INSERTION RIGHT  12/16/2017   IR IMAGING GUIDED PORT INSERTION  06/15/2021   KNEE ARTHROSCOPY Left 09/03/2022   Procedure: ARTHROSCOPY KNEE DEBRIDEMENT;  Surgeon: Leora Lynwood SAUNDERS, MD;  Location: [facility];  Service: Orthopedics;  Laterality: Left;   THYROIDECTOMY N/A     FAMILY HISTORY :   Family History  Problem Relation Age of Onset   Pneumonia Mother    Seizures Father     SOCIAL HISTORY:   Social History   Tobacco Use   Smoking status: Former    Current packs/day: 0.00    Average packs/day: 0.3 packs/day    Types: Cigarettes    Quit date: 03/18/2021    Years since quitting: 3.3   Smokeless tobacco: Never   Tobacco comments:    2 cigarettes QOD  Vaping Use   Vaping status: Never Used  Substance Use Topics   Alcohol use: No   Drug use: No    ALLERGIES:  has no known allergies.  MEDICATIONS:  Current Outpatient Medications  Medication Sig Dispense Refill   acyclovir  (ZOVIRAX ) 400 MG tablet Take 1 tablet (400 mg total) by mouth 2 (two) times daily. 120 tablet 2   albuterol  (VENTOLIN  HFA) 108 (90 Base) MCG/ACT inhaler Inhale  2 puffs into the lungs every 6 (six) hours as needed for wheezing or shortness of breath. 8 g 3   aspirin  EC 81 MG EC tablet Take 1 tablet (81 mg total) by mouth daily. 90 tablet 3   brimonidine -timolol  (COMBIGAN ) 0.2-0.5 % ophthalmic solution Apply 1 drop to eye 2 (two) times daily.     Brinzolamide -Brimonidine  (SIMBRINZA) 1-0.2 % SUSP Apply 1 drop to eye in the morning and at bedtime. I drop both eyes twice a day     Calcium  Carb-Cholecalciferol 600-10 MG-MCG TABS Take 1 tablet by mouth daily.     calcium  carbonate (CALCIUM  600 HIGH POTENCY) 600 MG TABS tablet Take 2 tablets (1,200 mg total) by mouth 2 (two) times daily. 120 tablet 2   dorzolamide (TRUSOPT) 2 % ophthalmic solution 1 drop 2 (two) times daily.     furosemide  (LASIX ) 40 MG tablet Take 1 tablet (40 mg total) by mouth daily as needed for edema or fluid. 30 tablet 1   levothyroxine  (SYNTHROID ) 150 MCG tablet Take 1 tablet (150 mcg total) by mouth daily. For thyroid  30 tablet 1   lidocaine -prilocaine  (EMLA ) cream Apply 1 application topically as needed. Apply to port and cover with saran wrap 1-2 hours prior to port access 30 g 1   magnesium  oxide (MAG-OX) 400 (240 Mg) MG tablet Take 1 tablet (400 mg total) by mouth 2 (two) times daily. 90 tablet 1   montelukast  (SINGULAIR ) 10 MG tablet Take 1 tablet (10  mg total) by mouth at bedtime. 30 tablet 3   pantoprazole  (PROTONIX ) 40 MG tablet Take 1 tablet (40 mg total) by mouth daily. 90 tablet 0   polyethylene glycol (MIRALAX  / GLYCOLAX ) packet Take 17 g by mouth daily as needed for mild constipation. 14 each 0   spironolactone  (ALDACTONE ) 25 MG tablet Take 1 tablet (25 mg total) by mouth daily as needed (edema). 90 tablet 1   traMADol  (ULTRAM ) 50 MG tablet Take 1 tablet (50 mg total) by mouth every 12 (twelve) hours as needed. 60 tablet 0   Vitamin D , Ergocalciferol , (DRISDOL ) 1.25 MG (50000 UNIT) CAPS capsule Take 1 capsule (50,000 Units total) by mouth once a week. 12 capsule 0   NIFEdipine  (ADALAT CC) 30 MG 24 hr tablet Take 1 tablet (30 mg total) by mouth daily. 90 tablet 1   No current facility-administered medications for this visit.   Facility-Administered Medications Ordered in Other Visits  Medication Dose Route Frequency Provider Last Rate Last Admin   0.9 %  sodium chloride  infusion   Intravenous Once Madelon Welsch R, MD       acetaminophen  (TYLENOL ) tablet 650 mg  650 mg Oral Once Chesley Valls R, MD       carfilzomib  (KYPROLIS ) 100 mg in dextrose  5 % 100 mL chemo infusion  56 mg/m2 (Treatment Plan Recorded) Intravenous Once Jeananne Bedwell R, MD       dexamethasone  (DECADRON ) injection 5 mg  5 mg Intravenous Once Terrisha Lopata R, MD       diphenhydrAMINE  (BENADRYL ) injection 50 mg  50 mg Intravenous Once Eliyanna Ault R, MD       famotidine  (PEPCID ) IVPB 20 mg premix  20 mg Intravenous Once Lynnda Wiersma R, MD       isatuximab -irfc (SARCLISA ) 700 mg in sodium chloride  0.9 % 215 mL (2.8 mg/mL) chemo infusion  10 mg/kg (Treatment Plan Recorded) Intravenous Once Hezzie Karim R, MD        PHYSICAL EXAMINATION:   BP 137/73 (BP Location: Left Arm, Patient Position: Sitting, Cuff Size: Large)   Pulse 61   Temp 97.9 F (36.6 C) (Tympanic)   Resp 16   Ht 5' 2 (1.575 m)   Wt 161 lb 6.4 oz (73.2 kg)   SpO2 100%   BMI 29.52 kg/m   Filed Weights   07/24/24 0822  Weight: 161 lb 6.4 oz (73.2 kg)    Abdominals abscess-healing/spontaneously drained.SABRA  Physical Exam HENT:     Head: Normocephalic and atraumatic.  Eyes:     Pupils: Pupils are equal, round, and reactive to light.  Cardiovascular:     Rate and Rhythm: Normal rate and regular rhythm.  Pulmonary:     Effort: Pulmonary effort is normal. No respiratory distress.     Breath sounds: Normal breath sounds. No wheezing.  Abdominal:     General: Bowel sounds are normal. There is no distension.     Palpations: Abdomen is soft. There is no mass.     Tenderness: There  is no abdominal tenderness. There is no guarding or rebound.  Musculoskeletal:        General: No tenderness. Normal range of motion.     Cervical back: Normal range of motion and neck supple.  Skin:    General: Skin is warm.  Neurological:     Mental Status: She is alert and oriented to person, place, and time.  Psychiatric:        Mood and Affect: Affect normal.  LABORATORY DATA:  I have reviewed the data as listed    Component Value Date/Time   NA 141 07/24/2024 0833   NA 141 01/25/2020 1530   K 3.5 07/24/2024 0833   CL 106 07/24/2024 0833   CO2 22 07/24/2024 0833   GLUCOSE 91 07/24/2024 0833   BUN 16 07/24/2024 0833   BUN 19 01/25/2020 1530   CREATININE 0.86 07/24/2024 0833   CALCIUM  8.6 (L) 07/24/2024 0833   PROT 7.6 07/24/2024 0833   ALBUMIN 4.3 07/24/2024 0833   AST 18 07/24/2024 0833   ALT 9 07/24/2024 0833   ALKPHOS 66 07/24/2024 0833   BILITOT 0.3 07/24/2024 0833   GFRNONAA >60 07/24/2024 0833   GFRAA NOT CALCULATED 04/17/2020 1352    No results found for: SPEP, UPEP  Lab Results  Component Value Date   WBC 3.0 (L) 07/24/2024   NEUTROABS 0.9 (L) 07/24/2024   HGB 12.0 07/24/2024   HCT 37.0 07/24/2024   MCV 96.1 07/24/2024   PLT 207 07/24/2024      Chemistry      Component Value Date/Time   NA 141 07/24/2024 0833   NA 141 01/25/2020 1530   K 3.5 07/24/2024 0833   CL 106 07/24/2024 0833   CO2 22 07/24/2024 0833   BUN 16 07/24/2024 0833   BUN 19 01/25/2020 1530   CREATININE 0.86 07/24/2024 0833      Component Value Date/Time   CALCIUM  8.6 (L) 07/24/2024 0833   ALKPHOS 66 07/24/2024 0833   AST 18 07/24/2024 0833   ALT 9 07/24/2024 0833   BILITOT 0.3 07/24/2024 0833      (H): Data is abnormally high   Latest Reference Range & Units Most Recent 12/24/20 14:01 04/01/21 14:34 04/28/21 09:17 06/11/21 09:42 07/03/21 10:32 08/20/21 10:06  M Protein SerPl Elph-Mcnc Not Observed g/dL 1.1 (H) (C) 8/73/76 89:93 Not Observed (C) 0.6 (H) (C) 0.7  (H) (C) 0.5 (H) (C) 0.7 (H) (C) 1.1 (H) (C)    Latest Reference Range & Units Most Recent 09/17/21 08:47 10/15/21 08:25 11/12/21 08:55  Kappa free light chain 3.3 - 19.4 mg/L 5.0 11/12/21 08:55 7.2 7.2 5.0  Lambda free light chains 5.7 - 26.3 mg/L 223.6 (H) 11/12/21 08:55 215.3 (H) 340.2 (H) 223.6 (H)  Kappa, lambda light chain ratio 0.26 - 1.65  0.02 (L) 11/12/21 08:55 0.03 (L) 0.02 (L) 0.02 (L)  (H): Data is abnormally high (L): Data is abnormally low  RADIOGRAPHIC STUDIES: I have personally reviewed the radiological images as listed and agreed with the findings in the report. No results found.    ASSESSMENT & PLAN:  Multiple myeloma not having achieved remission (HCC)  # RECURRENT Multiple myeloma stage III [high-risk cytogenetics-status post KRD-VGPR ; status post autologous stem cell transplant on 06/15/18.   MAY 2024- 1.3. ]  currently on Isa-Dex; d-2 carfil weekly q 28 days. # MARCH 2025- M-protein -0.3 ; kappa lambda light chain ratio-1.8 lambda light chain 63-slowly RISING FEB 2025-chemotherapy was interrupted-until MAY 2025- because of thigh infection needing wound VAC.    # S/p re-evaluation at [facility] in June 2025- Labs c/w early progression. No evidence of end organ damage-  PET SEP 2025-  No findings of active myeloma.  low-grade activity in a 1.5 cm partially sclerotic lesion in the right distal femoral metaphysis, probably an enchondroma or similar incidental benign lesion. The appearance is not characteristic for myeloma.   # GIVEN SEP 2025-PET NEGATIVITY- For now continue with with  Isa-Dex-Carfil- Labs-CBC/chemistries were reviewed  with the patient. OCT 2025- MM/K-l light chain- RISING.  Will monitor closely- bone marrow biopsy-pending on 07/27/2024 to confirm progression.  If clinical progression noted would recommend evaluation again at [facility] for CAR-T/bispecific's. Discussed with patient and also Dr.gasperetto- awaiting Appt on Jan 18th, 2026. Echo on 1/05-Pending.   # LEFT  shoulder pain- with limited mobility-awaiting evaluation [facility]   # DROP in EF:  MARCH 2024-Echo [[facility]] Left ventricular EF 50 to 55%;  ECHO  04/23/2024 50-55%- stable.  Will repeat again in dec-jan 2026- pending today.   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    # Anemia- JAn 2025- I ron sat- 8%; ferritin- 19 on prn venofer - stable.    # Electrolyte: Hypokalemia: mild-  continue compliance Kdur TID-; hypocalcemia- 8.0- ? Causing cramps- recommend ca OTC BID   # Iatrogenic hypothyroidism [ Graves/goiter s/p total thyroidectomy;aug,2022 ]-currently on Synthroid  175 mcg/day-; will decrease the dose to 150 -  TSH < 0.01- will repeat- in few weeks.   # Glaucoma BIL- L > R [Dr.King; [facility]] - ? Steroid induced on eye drops. decreased the steroids. Discussed with Dr.Haines, ophthalmology- Glaucoma under control- s/p surgery for retinal detachment-on 11/12. Stable.   # chronic  Lower back discomfort - Continue tramadol - refilled Stable.   # Bone lesions /last zometa  on 11/27/2017.  S/p   Dental extraction- on dentures-  PET scan- NO evidence of any bone lesions. ? Zometa   given risk of ONJ.Stable. .   # Infection prophylaxis: Acyclovir ; asprin- add IVIG infusion [IgG < 400]- plan  IVIG- q 4 w #1 [sep 2025]; Hx  MAY 2025- thigh infection s/p wound VAC [Dr.Rodenberg]  prn  IVIG prn/monthly- NOV IgG- 480- recurrent abdominal abccess- Continue IVIG infusion-   # IV access: mediport- functional  # ACP:  interested in continue current scope of therapy; and full code.  6 w- given dental extrac- on 1/26. :MM panel; K/l light chain ratio q 4 W  ; NO zometa - dental issues-ok; but hypocalemia  Starting aug 25th- q2w-MD: D-isa-dex;car1&2-car; weekly x 3; week 4-OFF;  q 28 day cycle- pushed off to 6 weeks- 11/04; IVIG q M- - 12/18- PS:  # DISPOSITION: # chemo today; and tomorrow as planned # IVIG in 3 weeks- # order standing orders for 4  weeks- MM panel/k-l light chains- # follow up as per IS-  -Dr.B   Orders Placed This Encounter  Procedures   CBC with Differential (Cancer Center Only)    Standing Status:   Future    Expected Date:   08/21/2024    Expiration Date:   08/21/2025   CMP (Cancer Center only)    Standing Status:   Future    Expected Date:   08/21/2024    Expiration Date:   08/21/2025   CBC with Differential (Cancer Center Only)    Standing Status:   Future    Expected Date:   08/28/2024    Expiration Date:   08/28/2025   CMP (Cancer Center only)    Standing Status:   Future    Expected Date:   08/28/2024    Expiration Date:   08/28/2025   CBC with Differential (Cancer Center Only)    Standing Status:   Future    Expected Date:   09/04/2024    Expiration Date:   09/04/2025   CMP (Cancer Center only)    Standing Status:   Future    Expected Date:  09/04/2024    Expiration Date:   09/04/2025   Multiple Myeloma Panel (SPEP&IFE w/QIG)    Standing Status:   Standing    Number of Occurrences:   20    Expiration Date:   07/24/2025   Kappa/lambda light chains    Standing Status:   Standing    Number of Occurrences:   20    Expiration Date:   07/24/2025   All questions were answered. The patient knows to call the clinic with any problems, questions or concerns.      Cindy JONELLE Joe, MD 07/24/2024 9:39 AM End

## 2024-07-24 NOTE — Progress Notes (Signed)
 "   Chief Complaint: Multiple Myeloma  Referring Provider(s): Rennie Cindy SAUNDERS   Supervising Physician: Karalee Beat  Patient Status: [facility] - Out-pt  History of Present Illness: Brandi [name] is a 73 y.o. female with PMH significant for COPD, HTN, HF, multiple myeloma (s/p autologous stem cell transplant 06/15/18)  who is followed by Dr. Rennie with hem/onc. She is currently receiving therapy for multiple myeloma. Recent laboratory studies have shown changes in her disease markers which prompted request for bone marrow bx and asp with IR.   She is known to IR from BMBx and asp on 05/25/23 with Dr. Gar and 10/05/22 with Dr. Jennefer. Patient well tolerated versed  1 mg and 50 mg fentanyl  for mod sed for both of these procedures.   Confirms NPO since MN and ride/supervision available for 24 hours.   Denies fever, chills, SOB, CP, sore throat, N/V, abd pain, blood in stool or urine, abnormal bruising, leg swelling, back pain.   Allergies Reviewed:  Patient has no known allergies.   Patient is Full Code  Past Medical History:  Diagnosis Date   (HFpEF) heart failure with preserved ejection fraction (HCC)    a. 06/2020 Echo: EF 60-65%, no rwma, nl RV size/fxn. Triv MR. Triv TR/AI. Mod elev PASP.   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   Open wound of left thigh 12/15/2023   Pneumonia    Red blood cell antibody positive, compatible PRBC difficult to obtain    S/P autologous bone marrow transplantation (HCC)    Sepsis (HCC) 11/25/2023    Past Surgical History:  Procedure Laterality Date   ANTERIOR VITRECTOMY Left 10/07/2021   Procedure: ANTERIOR VITRECTOMY;  Surgeon: Mittie Gaskin,  MD;  Location: [facility];  Service: Ophthalmology;  Laterality: Left;   CATARACT EXTRACTION W/PHACO Left 10/07/2021   Procedure: CATARACT EXTRACTION PHACO AND INTRAOCULAR LENS PLACEMENT (IOC) LEFT VISION BLUE;  Surgeon: Mittie Gaskin, MD;  Location: [facility];  Service: Ophthalmology;  Laterality: Left;  kit for manual incision 14.56 01:21.4   CATARACT EXTRACTION W/PHACO Right 10/21/2021   Procedure: CATARACT EXTRACTION PHACO AND INTRAOCULAR LENS PLACEMENT (IOC) RIGHT 11.12 01:48.1;  Surgeon: Mittie Gaskin, MD;  Location: [facility];  Service: Ophthalmology;  Laterality: Right;   ESOPHAGOGASTRODUODENOSCOPY (EGD) WITH PROPOFOL  N/A 03/30/2019   Procedure: ESOPHAGOGASTRODUODENOSCOPY (EGD) WITH PROPOFOL ;  Surgeon: Therisa Bi, MD;  Location: [facility];  Service: Gastroenterology;  Laterality: N/A;   INCISION AND DRAINAGE ABSCESS Left 12/01/2023   Procedure: INCISION AND DRAINAGE, ABSCESS;  Surgeon: Lane Shope, MD;  Location: [facility] ORS;  Service: General;  Laterality: Left;   IR BONE MARROW BIOPSY & ASPIRATION  05/25/2023   IR FLUORO GUIDE PORT INSERTION RIGHT  12/16/2017   IR IMAGING GUIDED PORT INSERTION  06/15/2021   KNEE ARTHROSCOPY Left 09/03/2022   Procedure: ARTHROSCOPY KNEE DEBRIDEMENT;  Surgeon: Leora Lynwood SAUNDERS, MD;  Location: [facility] ORS;  Service: Orthopedics;  Laterality: Left;   THYROIDECTOMY N/A       Medications: Prior to Admission medications  Medication Sig Start Date End Date Taking? Authorizing Provider  acyclovir  (ZOVIRAX ) 400 MG tablet Take 1 tablet (400 mg total) by mouth 2 (two) times daily. 02/20/24   [name], [name], MD  albuterol  (VENTOLIN  HFA) 108 (90 Base) MCG/ACT inhaler Inhale 2 puffs into the lungs every 6 (six) hours as needed for wheezing or shortness of breath. 10/10/23   [name], [name], MD  aspirin  EC 81 MG EC tablet Take 1 tablet (81 mg total) by mouth daily. 01/25/20   Walker, Caitlin S, NP   brimonidine -timolol  (COMBIGAN ) 0.2-0.5 % ophthalmic solution Apply 1 drop to eye 2 (two) times daily. 05/06/24   [provider]  Brinzolamide -Brimonidine  (SIMBRINZA) 1-0.2 % SUSP Apply 1 drop to eye in the morning and at bedtime. I drop both eyes twice a day    [provider]  Calcium  Carb-Cholecalciferol 600-10 MG-MCG TABS Take 1 tablet by mouth daily.    [provider]  calcium  carbonate (CALCIUM  600 HIGH POTENCY) 600 MG TABS tablet Take 2 tablets (1,200 mg total) by mouth 2 (two) times daily. 08/05/23   Dasie Tinnie MATSU, NP  dorzolamide (TRUSOPT) 2 % ophthalmic solution 1 drop 2 (two) times daily. 05/12/24   [provider]  furosemide  (LASIX ) 40 MG tablet Take 1 tablet (40 mg total) by mouth daily as needed for edema or fluid. 06/26/24   [name], [name], MD  levothyroxine  (SYNTHROID ) 150 MCG tablet Take 1 tablet (150 mcg total) by mouth daily. For thyroid  06/12/24   [name], [name], MD  lidocaine -prilocaine  (EMLA ) cream Apply 1 application topically as needed. Apply to port and cover with saran wrap 1-2 hours prior to port access 07/03/21   [name], [name], MD  magnesium  oxide (MAG-OX) 400 (240 Mg) MG tablet Take 1 tablet (400 mg total) by mouth 2 (two) times daily. 05/01/24   [name], [name], MD  montelukast  (SINGULAIR ) 10 MG tablet Take 1 tablet (10 mg total) by mouth at bedtime. 03/27/24   [name], [name], MD  NIFEdipine (ADALAT CC) 30 MG 24 hr tablet Take 1 tablet (30 mg total) by mouth daily. 07/24/24   [name], [name], MD  pantoprazole  (PROTONIX ) 40 MG tablet Take 1 tablet (40 mg total) by mouth daily. 03/27/24 07/24/24  [name], [name], MD  polyethylene glycol (MIRALAX  / GLYCOLAX ) packet Take 17 g by mouth daily as needed for mild constipation. 04/11/17   Gouru, Aruna, MD  spironolactone  (ALDACTONE ) 25 MG tablet Take 1 tablet (25 mg total) by mouth daily as needed (edema). 05/01/24   [name], [name], MD  traMADol   (ULTRAM ) 50 MG tablet Take 1 tablet (50 mg total) by mouth every 12 (twelve) hours as needed. 07/10/24   [name], [name], MD  Vitamin D , Ergocalciferol , (DRISDOL ) 1.25 MG (50000 UNIT) CAPS capsule Take 1 capsule (50,000 Units total) by mouth once a week. 05/29/24   Rennie Cindy SAUNDERS, MD     Family History  Problem Relation Age of Onset   Pneumonia Mother    Seizures Father     Social History   Socioeconomic History   Marital status: Single    Spouse name: Not on file   Number of children: 3   Years of education: Not on file   Highest education level: Not on file  Occupational History   Not on file  Tobacco Use   Smoking status: Former    Current packs/day: 0.00    Average packs/day: 0.3 packs/day    Types: Cigarettes    Quit date: 03/18/2021    Years since quitting: 3.3   Smokeless tobacco: Never   Tobacco comments:    2 cigarettes every other ijb:Dfnxpwh Currently   Vaping Use  Vaping status: Never Used  Substance and Sexual Activity   Alcohol use: No   Drug use: No   Sexual activity: Not Currently  Other Topics Concern   Not on file  Social History Narrative   Lives at home with family(son): & daughter  States I'm never alone. Independent at baseline   Social Drivers of Health   Tobacco Use: Medium Risk (07/27/2024)   Patient History    Smoking Tobacco Use: Former    Smokeless Tobacco Use: Never    Passive Exposure: Not on Actuary Strain: Not on file  Food Insecurity: No Food Insecurity (01/30/2024)   Received from [facility]   Epic    Within the past 12 months, you worried that your food would run out before you got the money to buy more.: Never true    Within the past 12 months, the food you bought just didn't last and you didn't have money to get more.: Never true  Transportation Needs: No Transportation Needs (01/30/2024)   Received from [facility] - Transportation    Lack of Transportation (Medical): No    Lack of  Transportation (Non-Medical): No  Physical Activity: Not on file  Stress: No Stress Concern Present (01/30/2024)   Received from [facility] - Occupational Stress Questionnaire    Feeling of Stress : Not at all  Social Connections: Not on file  Depression (PHQ2-9): Low Risk (07/24/2024)   Depression (PHQ2-9)    PHQ-2 Score: 0  Alcohol Screen: Not on file  Housing: Low Risk (01/30/2024)   Received from [facility]    In the last 12 months, was there a time when you were not able to pay the mortgage or rent on time?: No    In the past 12 months, how many times have you moved where you were living?: 0    At any time in the past 12 months, were you homeless or living in a shelter (including now)?: No  Utilities: Not At Risk (01/30/2024)   Received from [facility] Utilities    Threatened with loss of utilities: No  Health Literacy: Not on file     Review of Systems: A 12 point ROS discussed and pertinent positives are indicated in the HPI above.  All other systems are negative.  Vital Signs: BP (!) 143/70   Pulse 71   Temp (!) 97.1 F (36.2 C) (Temporal)   Resp (!) 22   Ht 5' 2 (1.575 m)   Wt 166 lb 0.1 oz (75.3 kg)   SpO2 98%   BMI 30.36 kg/m    Physical Exam HENT:     Mouth/Throat:     Mouth: Mucous membranes are moist.     Pharynx: Oropharynx is clear.  Cardiovascular:     Rate and Rhythm: Normal rate and regular rhythm.     Pulses: Normal pulses.     Heart sounds: Normal heart sounds.     Comments: Port R chest accessed, no site concerns noted Pulmonary:     Effort: Pulmonary effort is normal.     Breath sounds: Normal breath sounds.  Abdominal:     General: There is no distension.     Palpations: Abdomen is soft.     Tenderness: There is no abdominal tenderness.  Skin:    General: Skin is warm and dry.     Comments: No rash or lesion over planned  puncture site  Neurological:     Mental Status: She  is alert and oriented to person, place, and time.     Sensory: No sensory deficit.  Psychiatric:        Mood and Affect: Mood normal.        Behavior: Behavior normal.        Thought Content: Thought content normal.        Judgment: Judgment normal.     Imaging: No results found.  Labs:  CBC: Recent Labs    07/03/24 0833 07/10/24 0854 07/24/24 0833 07/27/24 0749  WBC 4.1 3.1* 3.0* 4.0  HGB 11.8* 11.3* 12.0 11.9*  HCT 35.2* 33.6* 37.0 35.9*  PLT 81* 114* 207 156    COAGS: Recent Labs    07/27/24 0749  INR 0.9    BMP: Recent Labs    06/26/24 0900 07/03/24 0833 07/10/24 0854 07/24/24 0833  NA 141 144 144 141  K 3.3* 3.2* 3.2* 3.5  CL 111 111 111 106  CO2 22 19* 20* 22  GLUCOSE 97 84 107* 91  BUN 18 13 10 16   CALCIUM  8.1* 8.6* 8.5* 8.6*  CREATININE 0.71 0.69 0.72 0.86  GFRNONAA >60 >60 >60 >60    LIVER FUNCTION TESTS: Recent Labs    06/26/24 0900 07/03/24 0833 07/10/24 0854 07/24/24 0833  BILITOT 0.7 0.5 0.6 0.3  AST 16 13* 14* 18  ALT 11 12 13 9   ALKPHOS 58 66 61 66  PROT 7.2 6.9 6.7 7.6  ALBUMIN 3.9 4.2 4.1 4.3    TUMOR MARKERS: No results for input(s): AFPTM, CEA, CA199, CHROMGRNA in the last 8760 hours.  Assessment and Plan:  Patient with multiple myeloma presenting for BMBx and asp as part of ongoing disease management.  No contraindications for procedure identified in ROS, physical exam, or review of pre-sedation considerations.  CBC w/ diff obtained same day as procedure  04/16/24 PET imaging available and reviewed:  SKELETON: Low-grade activity in a 1.5 cm partially sclerotic lesion in the right distal femoral metaphysis with maximum SUV 2.4 (formerly 3.1) probably an enchondroma or similar incidental benign lesion. The appearance is not characteristic for myeloma. Overall no findings of active biloma are identified.  VSS, afebrile  Patient not asked to hold any AC/AP for this low bleeding risk procedure  Abx not  indicated    Risks and benefits of bone marrow biopsy and aspiration was discussed with the patient and/or patient's family including, but not limited to bleeding, infection, damage to adjacent structures or low yield requiring additional tests.  All of the questions were answered and there is agreement to proceed.  Consent signed and in chart.   Thank you for allowing our service to participate in Brandi [name] 's care.    Electronically Signed: Laymon Coast, NP   07/27/2024, 8:18 AM     I spent a total of  10 Minutes in face to face in clinical consultation, greater than 50% of which was counseling/coordinating care for image guided bone marrow biopsy and aspiration.    (A copy of this note was sent to the referring provider and the time of visit.)  "

## 2024-07-25 ENCOUNTER — Ambulatory Visit: Admitting: Radiology

## 2024-07-25 ENCOUNTER — Inpatient Hospital Stay

## 2024-07-25 VITALS — BP 136/68 | HR 67 | Temp 96.1°F | Resp 18

## 2024-07-25 DIAGNOSIS — Z5112 Encounter for antineoplastic immunotherapy: Secondary | ICD-10-CM | POA: Diagnosis not present

## 2024-07-25 DIAGNOSIS — C9000 Multiple myeloma not having achieved remission: Secondary | ICD-10-CM

## 2024-07-25 LAB — KAPPA/LAMBDA LIGHT CHAINS
Kappa free light chain: 1.4 mg/L — ABNORMAL LOW (ref 3.3–19.4)
Kappa, lambda light chain ratio: 0.01 — ABNORMAL LOW (ref 0.26–1.65)
Lambda free light chains: 211.2 mg/L — ABNORMAL HIGH (ref 5.7–26.3)

## 2024-07-25 MED ORDER — SODIUM CHLORIDE 0.9 % IV SOLN
Freq: Once | INTRAVENOUS | Status: AC
  Filled 2024-07-25: qty 250

## 2024-07-25 MED ORDER — DEXAMETHASONE SOD PHOSPHATE PF 10 MG/ML IJ SOLN
5.0000 mg | Freq: Once | INTRAMUSCULAR | Status: AC
  Administered 2024-07-25: 5 mg via INTRAVENOUS

## 2024-07-25 MED ORDER — DEXTROSE 5 % IV SOLN
56.0000 mg/m2 | Freq: Once | INTRAVENOUS | Status: AC
  Administered 2024-07-25: 100 mg via INTRAVENOUS
  Filled 2024-07-25: qty 30

## 2024-07-25 NOTE — Patient Instructions (Signed)
 [facility] - A DEPT OF [facility]  Discharge Instructions: Thank you for choosing [facility] to provide your oncology and hematology care.  If you have a lab appointment with the Cancer Center, please go directly to the Cancer Center and check in at the registration area.  Wear comfortable clothing and clothing appropriate for easy access to any Portacath or PICC line.   We strive to give you quality time with your provider. You may need to reschedule your appointment if you arrive late (15 or more minutes).  Arriving late affects you and other patients whose appointments are after yours.  Also, if you miss three or more appointments without notifying the office, you may be dismissed from the clinic at the provider's discretion.      For prescription refill requests, have your pharmacy contact our office and allow 72 hours for refills to be completed.    Today you received the following chemotherapy and/or immunotherapy agents: carfilzomib    To help prevent nausea and vomiting after your treatment, we encourage you to take your nausea medication as directed.  BELOW ARE SYMPTOMS THAT SHOULD BE REPORTED IMMEDIATELY: *FEVER GREATER THAN 100.4 F (38 C) OR HIGHER *CHILLS OR SWEATING *NAUSEA AND VOMITING THAT IS NOT CONTROLLED WITH YOUR NAUSEA MEDICATION *UNUSUAL SHORTNESS OF BREATH *UNUSUAL BRUISING OR BLEEDING *URINARY PROBLEMS (pain or burning when urinating, or frequent urination) *BOWEL PROBLEMS (unusual diarrhea, constipation, pain near the anus) TENDERNESS IN MOUTH AND THROAT WITH OR WITHOUT PRESENCE OF ULCERS (sore throat, sores in mouth, or a toothache) UNUSUAL RASH, SWELLING OR PAIN  UNUSUAL VAGINAL DISCHARGE OR ITCHING   Items with * indicate a potential emergency and should be followed up as soon as possible or go to the Emergency Department if any problems should occur.  Please show the CHEMOTHERAPY ALERT CARD or IMMUNOTHERAPY  ALERT CARD at check-in to the Emergency Department and triage nurse.  Should you have questions after your visit or need to cancel or reschedule your appointment, please contact [facility] - A DEPT OF [facility]  [phone] and follow the prompts.  Office hours are 8:00 a.m. to 4:30 p.m. Monday - Friday. Please note that voicemails left after 4:00 p.m. may not be returned until the following business day.  We are closed weekends and major holidays. You have access to a nurse at all times for urgent questions. Please call the main number to the clinic [phone] and follow the prompts.  For any non-urgent questions, you may also contact your provider using MyChart. We now offer e-Visits for anyone 87 and older to request care online for non-urgent symptoms. For details visit [email or web address].   Also download the MyChart app! Go to the app store, search "MyChart", open the app, select , and log in with your MyChart username and password.

## 2024-07-27 ENCOUNTER — Other Ambulatory Visit: Payer: Self-pay

## 2024-07-27 ENCOUNTER — Ambulatory Visit
Admission: RE | Admit: 2024-07-27 | Discharge: 2024-07-27 | Disposition: A | Discharge status: Home / Self Care | Source: Ambulatory Visit | Attending: Internal Medicine | Admitting: Internal Medicine

## 2024-07-27 ENCOUNTER — Encounter: Payer: Self-pay | Admitting: Radiology

## 2024-07-27 DIAGNOSIS — Z9484 Stem cells transplant status: Secondary | ICD-10-CM | POA: Insufficient documentation

## 2024-07-27 DIAGNOSIS — Z87891 Personal history of nicotine dependence: Secondary | ICD-10-CM | POA: Diagnosis not present

## 2024-07-27 DIAGNOSIS — Z79899 Other long term (current) drug therapy: Secondary | ICD-10-CM | POA: Insufficient documentation

## 2024-07-27 DIAGNOSIS — Z1379 Encounter for other screening for genetic and chromosomal anomalies: Secondary | ICD-10-CM | POA: Insufficient documentation

## 2024-07-27 DIAGNOSIS — Z01818 Encounter for other preprocedural examination: Secondary | ICD-10-CM

## 2024-07-27 DIAGNOSIS — C9000 Multiple myeloma not having achieved remission: Secondary | ICD-10-CM | POA: Diagnosis present

## 2024-07-27 DIAGNOSIS — Z7982 Long term (current) use of aspirin: Secondary | ICD-10-CM | POA: Insufficient documentation

## 2024-07-27 DIAGNOSIS — I110 Hypertensive heart disease with heart failure: Secondary | ICD-10-CM | POA: Insufficient documentation

## 2024-07-27 DIAGNOSIS — I5032 Chronic diastolic (congestive) heart failure: Secondary | ICD-10-CM | POA: Diagnosis not present

## 2024-07-27 DIAGNOSIS — J449 Chronic obstructive pulmonary disease, unspecified: Secondary | ICD-10-CM | POA: Insufficient documentation

## 2024-07-27 HISTORY — PX: IR BONE MARROW BIOPSY & ASPIRATION: IMG5727

## 2024-07-27 LAB — CBC WITH DIFFERENTIAL/PLATELET
Abs Immature Granulocytes: 0.09 K/uL — ABNORMAL HIGH (ref 0.00–0.07)
Basophils Absolute: 0 K/uL (ref 0.0–0.1)
Basophils Relative: 0 %
Eosinophils Absolute: 0 K/uL (ref 0.0–0.5)
Eosinophils Relative: 1 %
HCT: 35.9 % — ABNORMAL LOW (ref 36.0–46.0)
Hemoglobin: 11.9 g/dL — ABNORMAL LOW (ref 12.0–15.0)
Immature Granulocytes: 2 %
Lymphocytes Relative: 53 %
Lymphs Abs: 2.1 K/uL (ref 0.7–4.0)
MCH: 31.5 pg (ref 26.0–34.0)
MCHC: 33.1 g/dL (ref 30.0–36.0)
MCV: 95 fL (ref 80.0–100.0)
Monocytes Absolute: 0.5 K/uL (ref 0.1–1.0)
Monocytes Relative: 12 %
Neutro Abs: 1.3 K/uL — ABNORMAL LOW (ref 1.7–7.7)
Neutrophils Relative %: 32 %
Platelets: 156 K/uL (ref 150–400)
RBC: 3.78 MIL/uL — ABNORMAL LOW (ref 3.87–5.11)
RDW: 14.3 % (ref 11.5–15.5)
Smear Review: NORMAL
WBC: 4 K/uL (ref 4.0–10.5)
nRBC: 1.5 % — ABNORMAL HIGH (ref 0.0–0.2)

## 2024-07-27 LAB — MULTIPLE MYELOMA PANEL, SERUM
Albumin SerPl Elph-Mcnc: 3.8 g/dL (ref 2.9–4.4)
Albumin/Glob SerPl: 1.2 (ref 0.7–1.7)
Alpha 1: 0.1 g/dL (ref 0.0–0.4)
Alpha2 Glob SerPl Elph-Mcnc: 0.6 g/dL (ref 0.4–1.0)
B-Globulin SerPl Elph-Mcnc: 1.4 g/dL — ABNORMAL HIGH (ref 0.7–1.3)
Gamma Glob SerPl Elph-Mcnc: 1.3 g/dL (ref 0.4–1.8)
Globulin, Total: 3.4 g/dL (ref 2.2–3.9)
IgA: 561 mg/dL — ABNORMAL HIGH (ref 64–422)
IgG (Immunoglobin G), Serum: 833 mg/dL (ref 586–1602)
IgM (Immunoglobulin M), Srm: <5 mg/dL — ABNORMAL LOW (ref 26–217)
M Protein SerPl Elph-Mcnc: 0.7 g/dL — ABNORMAL HIGH
Total Protein ELP: 7.2 g/dL (ref 6.0–8.5)

## 2024-07-27 LAB — PROTIME-INR
INR: 0.9 (ref 0.8–1.2)
Prothrombin Time: 12.7 s (ref 11.4–15.2)

## 2024-07-27 MED ORDER — HEPARIN SOD (PORK) LOCK FLUSH 100 UNIT/ML IV SOLN
INTRAVENOUS | Status: AC
  Filled 2024-07-27: qty 5

## 2024-07-27 MED ORDER — MIDAZOLAM HCL (PF) 2 MG/2ML IJ SOLN
INTRAMUSCULAR | Status: AC | PRN
  Administered 2024-07-27: 1 mg via INTRAVENOUS
  Administered 2024-07-27: .5 mg via INTRAVENOUS

## 2024-07-27 MED ORDER — LIDOCAINE 1 % OPTIME INJ - NO CHARGE
5.0000 mL | Freq: Once | INTRAMUSCULAR | Status: AC
  Administered 2024-07-27: 5 mL via INTRADERMAL
  Filled 2024-07-27: qty 6

## 2024-07-27 MED ORDER — MIDAZOLAM HCL 2 MG/2ML IJ SOLN
INTRAMUSCULAR | Status: AC
  Filled 2024-07-27: qty 2

## 2024-07-27 MED ORDER — SODIUM CHLORIDE 0.9 % IV SOLN: INTRAVENOUS | Status: DC

## 2024-07-27 MED ORDER — FENTANYL CITRATE (PF) 100 MCG/2ML IJ SOLN
INTRAMUSCULAR | Status: AC | PRN
  Administered 2024-07-27: 25 ug via INTRAVENOUS
  Administered 2024-07-27: 50 ug via INTRAVENOUS

## 2024-07-27 MED ORDER — FENTANYL CITRATE (PF) 100 MCG/2ML IJ SOLN
INTRAMUSCULAR | Status: AC
  Filled 2024-07-27: qty 2

## 2024-07-27 MED ORDER — HEPARIN SOD (PORK) LOCK FLUSH 100 UNIT/ML IV SOLN: 500.0000 [IU] | Freq: Once | INTRAVENOUS | Status: DC

## 2024-07-27 NOTE — Procedures (Signed)
 Interventional Radiology Procedure Note  Procedure: CT guided aspirate and core biopsy of left iliac bone Complications: None Recommendations: - Bedrest supine x 1 hrs - Hydrocodone PRN  Pain - Follow biopsy results  Signed,  Sterling Big, MD

## 2024-07-30 ENCOUNTER — Ambulatory Visit
Admission: RE | Admit: 2024-07-30 | Discharge: 2024-07-30 | Disposition: A | Discharge status: Home / Self Care | Source: Ambulatory Visit | Attending: Internal Medicine | Admitting: Internal Medicine

## 2024-07-30 DIAGNOSIS — Z9221 Personal history of antineoplastic chemotherapy: Secondary | ICD-10-CM | POA: Diagnosis not present

## 2024-07-30 DIAGNOSIS — Z0189 Encounter for other specified special examinations: Secondary | ICD-10-CM

## 2024-07-30 DIAGNOSIS — I081 Rheumatic disorders of both mitral and tricuspid valves: Secondary | ICD-10-CM | POA: Insufficient documentation

## 2024-07-30 DIAGNOSIS — I10 Essential (primary) hypertension: Secondary | ICD-10-CM | POA: Diagnosis not present

## 2024-07-30 DIAGNOSIS — J449 Chronic obstructive pulmonary disease, unspecified: Secondary | ICD-10-CM | POA: Insufficient documentation

## 2024-07-30 LAB — ECHOCARDIOGRAM COMPLETE
AR max vel: 2 cm2
AV Area VTI: 2.14 cm2
AV Area mean vel: 1.77 cm2
AV Mean grad: 3 mmHg
AV Peak grad: 4.8 mmHg
Ao pk vel: 1.1 m/s
Area-P 1/2: 2.62 cm2
Calc EF: 34.4 %
MV VTI: 1.78 cm2
S' Lateral: 3.1 cm
Single Plane A2C EF: 35.2 %
Single Plane A4C EF: 28.5 %

## 2024-07-30 NOTE — Progress Notes (Signed)
*  PRELIMINARY RESULTS* Echocardiogram 2D Echocardiogram has been performed.  Brandi [name] 07/30/2024, 11:44 AM

## 2024-07-31 ENCOUNTER — Inpatient Hospital Stay: Attending: Internal Medicine

## 2024-07-31 ENCOUNTER — Inpatient Hospital Stay

## 2024-07-31 VITALS — BP 147/85 | HR 63 | Temp 97.0°F | Resp 18

## 2024-07-31 DIAGNOSIS — Z5112 Encounter for antineoplastic immunotherapy: Secondary | ICD-10-CM | POA: Diagnosis present

## 2024-07-31 DIAGNOSIS — Z79620 Long term (current) use of immunosuppressive biologic: Secondary | ICD-10-CM | POA: Insufficient documentation

## 2024-07-31 DIAGNOSIS — C9000 Multiple myeloma not having achieved remission: Secondary | ICD-10-CM

## 2024-07-31 DIAGNOSIS — Z87891 Personal history of nicotine dependence: Secondary | ICD-10-CM | POA: Diagnosis not present

## 2024-07-31 DIAGNOSIS — E876 Hypokalemia: Secondary | ICD-10-CM | POA: Insufficient documentation

## 2024-07-31 DIAGNOSIS — C9002 Multiple myeloma in relapse: Secondary | ICD-10-CM | POA: Insufficient documentation

## 2024-07-31 LAB — CBC WITH DIFFERENTIAL (CANCER CENTER ONLY)
Abs Immature Granulocytes: 0.01 K/uL (ref 0.00–0.07)
Basophils Absolute: 0 K/uL (ref 0.0–0.1)
Basophils Relative: 1 %
Eosinophils Absolute: 0.1 K/uL (ref 0.0–0.5)
Eosinophils Relative: 1 %
HCT: 37.3 % (ref 36.0–46.0)
Hemoglobin: 12.5 g/dL (ref 12.0–15.0)
Immature Granulocytes: 0 %
Lymphocytes Relative: 49 %
Lymphs Abs: 1.8 K/uL (ref 0.7–4.0)
MCH: 32 pg (ref 26.0–34.0)
MCHC: 33.5 g/dL (ref 30.0–36.0)
MCV: 95.4 fL (ref 80.0–100.0)
Monocytes Absolute: 0.4 K/uL (ref 0.1–1.0)
Monocytes Relative: 10 %
Neutro Abs: 1.4 K/uL — ABNORMAL LOW (ref 1.7–7.7)
Neutrophils Relative %: 39 %
Platelet Count: 73 K/uL — ABNORMAL LOW (ref 150–400)
RBC: 3.91 MIL/uL (ref 3.87–5.11)
RDW: 14.4 % (ref 11.5–15.5)
WBC Count: 3.6 K/uL — ABNORMAL LOW (ref 4.0–10.5)
nRBC: 0 % (ref 0.0–0.2)

## 2024-07-31 LAB — CMP (CANCER CENTER ONLY)
ALT: 14 U/L (ref 0–44)
AST: 16 U/L (ref 15–41)
Albumin: 4.1 g/dL (ref 3.5–5.0)
Alkaline Phosphatase: 61 U/L (ref 38–126)
Anion gap: 11 (ref 5–15)
BUN: 16 mg/dL (ref 8–23)
CO2: 24 mmol/L (ref 22–32)
Calcium: 9 mg/dL (ref 8.9–10.3)
Chloride: 106 mmol/L (ref 98–111)
Creatinine: 0.88 mg/dL (ref 0.44–1.00)
GFR, Estimated: >60 mL/min
Glucose, Bld: 112 mg/dL — ABNORMAL HIGH (ref 70–99)
Potassium: 4.1 mmol/L (ref 3.5–5.1)
Sodium: 142 mmol/L (ref 135–145)
Total Bilirubin: 0.4 mg/dL (ref 0.0–1.2)
Total Protein: 7.5 g/dL (ref 6.5–8.1)

## 2024-07-31 MED ORDER — DEXTROSE 5 % IV SOLN
56.0000 mg/m2 | Freq: Once | INTRAVENOUS | Status: AC
  Administered 2024-07-31: 100 mg via INTRAVENOUS
  Filled 2024-07-31: qty 30

## 2024-07-31 MED ORDER — SODIUM CHLORIDE 0.9 % IV SOLN
Freq: Once | INTRAVENOUS | Status: AC
  Filled 2024-07-31: qty 250

## 2024-07-31 MED ORDER — DEXAMETHASONE SOD PHOSPHATE PF 10 MG/ML IJ SOLN
5.0000 mg | Freq: Once | INTRAMUSCULAR | Status: AC
  Administered 2024-07-31: 5 mg via INTRAVENOUS

## 2024-07-31 NOTE — Patient Instructions (Signed)
 [facility] - A DEPT OF [facility]  Discharge Instructions: Thank you for choosing [facility] to provide your oncology and hematology care.  If you have a lab appointment with the Cancer Center, please go directly to the Cancer Center and check in at the registration area.  Wear comfortable clothing and clothing appropriate for easy access to any Portacath or PICC line.   We strive to give you quality time with your provider. You may need to reschedule your appointment if you arrive late (15 or more minutes).  Arriving late affects you and other patients whose appointments are after yours.  Also, if you miss three or more appointments without notifying the office, you may be dismissed from the clinic at the provider's discretion.      For prescription refill requests, have your pharmacy contact our office and allow 72 hours for refills to be completed.    Today you received the following chemotherapy and/or immunotherapy agents: carfilzomib    To help prevent nausea and vomiting after your treatment, we encourage you to take your nausea medication as directed.  BELOW ARE SYMPTOMS THAT SHOULD BE REPORTED IMMEDIATELY: *FEVER GREATER THAN 100.4 F (38 C) OR HIGHER *CHILLS OR SWEATING *NAUSEA AND VOMITING THAT IS NOT CONTROLLED WITH YOUR NAUSEA MEDICATION *UNUSUAL SHORTNESS OF BREATH *UNUSUAL BRUISING OR BLEEDING *URINARY PROBLEMS (pain or burning when urinating, or frequent urination) *BOWEL PROBLEMS (unusual diarrhea, constipation, pain near the anus) TENDERNESS IN MOUTH AND THROAT WITH OR WITHOUT PRESENCE OF ULCERS (sore throat, sores in mouth, or a toothache) UNUSUAL RASH, SWELLING OR PAIN  UNUSUAL VAGINAL DISCHARGE OR ITCHING   Items with * indicate a potential emergency and should be followed up as soon as possible or go to the Emergency Department if any problems should occur.  Please show the CHEMOTHERAPY ALERT CARD or IMMUNOTHERAPY  ALERT CARD at check-in to the Emergency Department and triage nurse.  Should you have questions after your visit or need to cancel or reschedule your appointment, please contact [facility] - A DEPT OF [facility]  [phone] and follow the prompts.  Office hours are 8:00 a.m. to 4:30 p.m. Monday - Friday. Please note that voicemails left after 4:00 p.m. may not be returned until the following business day.  We are closed weekends and major holidays. You have access to a nurse at all times for urgent questions. Please call the main number to the clinic [phone] and follow the prompts.  For any non-urgent questions, you may also contact your provider using MyChart. We now offer e-Visits for anyone 87 and older to request care online for non-urgent symptoms. For details visit [email or web address].   Also download the MyChart app! Go to the app store, search "MyChart", open the app, select , and log in with your MyChart username and password.

## 2024-08-01 ENCOUNTER — Inpatient Hospital Stay

## 2024-08-01 VITALS — BP 115/78 | HR 64 | Temp 98.0°F | Resp 18

## 2024-08-01 DIAGNOSIS — C9000 Multiple myeloma not having achieved remission: Secondary | ICD-10-CM

## 2024-08-01 DIAGNOSIS — Z5112 Encounter for antineoplastic immunotherapy: Secondary | ICD-10-CM | POA: Diagnosis not present

## 2024-08-01 LAB — KAPPA/LAMBDA LIGHT CHAINS
Kappa free light chain: 1.2 mg/L — ABNORMAL LOW (ref 3.3–19.4)
Kappa, lambda light chain ratio: 0.01 — ABNORMAL LOW (ref 0.26–1.65)
Lambda free light chains: 212.9 mg/L — ABNORMAL HIGH (ref 5.7–26.3)

## 2024-08-01 LAB — SURGICAL PATHOLOGY

## 2024-08-01 MED ORDER — SODIUM CHLORIDE 0.9 % IV SOLN
Freq: Once | INTRAVENOUS | Status: AC
  Filled 2024-08-01: qty 250

## 2024-08-01 MED ORDER — DEXAMETHASONE SOD PHOSPHATE PF 10 MG/ML IJ SOLN
5.0000 mg | Freq: Once | INTRAMUSCULAR | Status: AC
  Administered 2024-08-01: 5 mg via INTRAVENOUS
  Filled 2024-08-01: qty 0.5

## 2024-08-01 MED ORDER — DEXTROSE 5 % IV SOLN
56.0000 mg/m2 | Freq: Once | INTRAVENOUS | Status: AC
  Administered 2024-08-01: 100 mg via INTRAVENOUS
  Filled 2024-08-01: qty 30

## 2024-08-01 NOTE — Patient Instructions (Signed)
 [facility] - A DEPT OF [facility]  Discharge Instructions: Thank you for choosing [facility] to provide your oncology and hematology care.  If you have a lab appointment with the Cancer Center, please go directly to the Cancer Center and check in at the registration area.  Wear comfortable clothing and clothing appropriate for easy access to any Portacath or PICC line.   We strive to give you quality time with your provider. You may need to reschedule your appointment if you arrive late (15 or more minutes).  Arriving late affects you and other patients whose appointments are after yours.  Also, if you miss three or more appointments without notifying the office, you may be dismissed from the clinic at the provider's discretion.      For prescription refill requests, have your pharmacy contact our office and allow 72 hours for refills to be completed.    Today you received the following chemotherapy and/or immunotherapy agents Kyprolis      To help prevent nausea and vomiting after your treatment, we encourage you to take your nausea medication as directed.  BELOW ARE SYMPTOMS THAT SHOULD BE REPORTED IMMEDIATELY: *FEVER GREATER THAN 100.4 F (38 C) OR HIGHER *CHILLS OR SWEATING *NAUSEA AND VOMITING THAT IS NOT CONTROLLED WITH YOUR NAUSEA MEDICATION *UNUSUAL SHORTNESS OF BREATH *UNUSUAL BRUISING OR BLEEDING *URINARY PROBLEMS (pain or burning when urinating, or frequent urination) *BOWEL PROBLEMS (unusual diarrhea, constipation, pain near the anus) TENDERNESS IN MOUTH AND THROAT WITH OR WITHOUT PRESENCE OF ULCERS (sore throat, sores in mouth, or a toothache) UNUSUAL RASH, SWELLING OR PAIN  UNUSUAL VAGINAL DISCHARGE OR ITCHING   Items with * indicate a potential emergency and should be followed up as soon as possible or go to the Emergency Department if any problems should occur.  Please show the CHEMOTHERAPY ALERT CARD or IMMUNOTHERAPY  ALERT CARD at check-in to the Emergency Department and triage nurse.  Should you have questions after your visit or need to cancel or reschedule your appointment, please contact [facility] - A DEPT OF [facility]  [phone] and follow the prompts.  Office hours are 8:00 a.m. to 4:30 p.m. Monday - Friday. Please note that voicemails left after 4:00 p.m. may not be returned until the following business day.  We are closed weekends and major holidays. You have access to a nurse at all times for urgent questions. Please call the main number to the clinic [phone] and follow the prompts.  For any non-urgent questions, you may also contact your provider using MyChart. We now offer e-Visits for anyone 36 and older to request care online for non-urgent symptoms. For details visit [email or web address].   Also download the MyChart app! Go to the app store, search "MyChart", open the app, select , and log in with your MyChart username and password.

## 2024-08-02 LAB — MULTIPLE MYELOMA PANEL, SERUM
Albumin SerPl Elph-Mcnc: 3.5 g/dL (ref 2.9–4.4)
Albumin/Glob SerPl: 1 (ref 0.7–1.7)
Alpha 1: 0.2 g/dL (ref 0.0–0.4)
Alpha2 Glob SerPl Elph-Mcnc: 0.7 g/dL (ref 0.4–1.0)
B-Globulin SerPl Elph-Mcnc: 1.4 g/dL — ABNORMAL HIGH (ref 0.7–1.3)
Gamma Glob SerPl Elph-Mcnc: 1.4 g/dL (ref 0.4–1.8)
Globulin, Total: 3.7 g/dL (ref 2.2–3.9)
IgA: 572 mg/dL — ABNORMAL HIGH (ref 64–422)
IgG (Immunoglobin G), Serum: 691 mg/dL (ref 586–1602)
IgM (Immunoglobulin M), Srm: <5 mg/dL — ABNORMAL LOW (ref 26–217)
M Protein SerPl Elph-Mcnc: 0.6 g/dL — ABNORMAL HIGH
Total Protein ELP: 7.2 g/dL (ref 6.0–8.5)

## 2024-08-03 ENCOUNTER — Encounter (HOSPITAL_COMMUNITY): Payer: Self-pay

## 2024-08-07 ENCOUNTER — Inpatient Hospital Stay

## 2024-08-07 ENCOUNTER — Encounter: Payer: Self-pay | Admitting: Internal Medicine

## 2024-08-07 ENCOUNTER — Inpatient Hospital Stay: Admitting: Internal Medicine

## 2024-08-07 VITALS — BP 124/75 | HR 70

## 2024-08-07 DIAGNOSIS — E032 Hypothyroidism due to medicaments and other exogenous substances: Secondary | ICD-10-CM

## 2024-08-07 DIAGNOSIS — C9000 Multiple myeloma not having achieved remission: Secondary | ICD-10-CM

## 2024-08-07 DIAGNOSIS — Z5112 Encounter for antineoplastic immunotherapy: Secondary | ICD-10-CM | POA: Diagnosis not present

## 2024-08-07 LAB — CBC WITH DIFFERENTIAL (CANCER CENTER ONLY)
Abs Immature Granulocytes: 0.01 K/uL (ref 0.00–0.07)
Basophils Absolute: 0 K/uL (ref 0.0–0.1)
Basophils Relative: 0 %
Eosinophils Absolute: 0 K/uL (ref 0.0–0.5)
Eosinophils Relative: 1 %
HCT: 36.6 % (ref 36.0–46.0)
Hemoglobin: 12.1 g/dL (ref 12.0–15.0)
Immature Granulocytes: 0 %
Lymphocytes Relative: 34 %
Lymphs Abs: 1.4 K/uL (ref 0.7–4.0)
MCH: 31.8 pg (ref 26.0–34.0)
MCHC: 33.1 g/dL (ref 30.0–36.0)
MCV: 96.1 fL (ref 80.0–100.0)
Monocytes Absolute: 0.4 K/uL (ref 0.1–1.0)
Monocytes Relative: 10 %
Neutro Abs: 2.2 K/uL (ref 1.7–7.7)
Neutrophils Relative %: 55 %
Platelet Count: 101 K/uL — ABNORMAL LOW (ref 150–400)
RBC: 3.81 MIL/uL — ABNORMAL LOW (ref 3.87–5.11)
RDW: 14.7 % (ref 11.5–15.5)
WBC Count: 4 K/uL (ref 4.0–10.5)
nRBC: 0 % (ref 0.0–0.2)

## 2024-08-07 LAB — CMP (CANCER CENTER ONLY)
ALT: 21 U/L (ref 0–44)
AST: 23 U/L (ref 15–41)
Albumin: 4.3 g/dL (ref 3.5–5.0)
Alkaline Phosphatase: 71 U/L (ref 38–126)
Anion gap: 12 (ref 5–15)
BUN: 11 mg/dL (ref 8–23)
CO2: 23 mmol/L (ref 22–32)
Calcium: 9.3 mg/dL (ref 8.9–10.3)
Chloride: 107 mmol/L (ref 98–111)
Creatinine: 0.76 mg/dL (ref 0.44–1.00)
GFR, Estimated: >60 mL/min
Glucose, Bld: 93 mg/dL (ref 70–99)
Potassium: 3.7 mmol/L (ref 3.5–5.1)
Sodium: 143 mmol/L (ref 135–145)
Total Bilirubin: 0.5 mg/dL (ref 0.0–1.2)
Total Protein: 7.6 g/dL (ref 6.5–8.1)

## 2024-08-07 MED ORDER — DIPHENHYDRAMINE HCL 50 MG/ML IJ SOLN
50.0000 mg | Freq: Once | INTRAMUSCULAR | Status: AC
  Administered 2024-08-07: 50 mg via INTRAVENOUS
  Filled 2024-08-07: qty 1

## 2024-08-07 MED ORDER — SODIUM CHLORIDE 0.9 % IV SOLN
Freq: Once | INTRAVENOUS | Status: AC
  Filled 2024-08-07: qty 250

## 2024-08-07 MED ORDER — ACETAMINOPHEN 325 MG PO TABS
650.0000 mg | ORAL_TABLET | Freq: Once | ORAL | Status: AC
  Administered 2024-08-07: 650 mg via ORAL
  Filled 2024-08-07: qty 2

## 2024-08-07 MED ORDER — DEXAMETHASONE SOD PHOSPHATE PF 10 MG/ML IJ SOLN
5.0000 mg | Freq: Once | INTRAMUSCULAR | Status: AC
  Administered 2024-08-07: 5 mg via INTRAVENOUS
  Filled 2024-08-07: qty 1

## 2024-08-07 MED ORDER — DEXTROSE 5 % IV SOLN
56.0000 mg/m2 | Freq: Once | INTRAVENOUS | Status: AC
  Administered 2024-08-07: 100 mg via INTRAVENOUS
  Filled 2024-08-07: qty 30

## 2024-08-07 MED ORDER — SODIUM CHLORIDE 0.9 % IV SOLN
10.0000 mg/kg | Freq: Once | INTRAVENOUS | Status: AC
  Administered 2024-08-07: 700 mg via INTRAVENOUS
  Filled 2024-08-07: qty 25

## 2024-08-07 MED ORDER — FAMOTIDINE IN NACL 20-0.9 MG/50ML-% IV SOLN
20.0000 mg | Freq: Once | INTRAVENOUS | Status: AC
  Administered 2024-08-07: 20 mg via INTRAVENOUS
  Filled 2024-08-07: qty 50

## 2024-08-07 NOTE — Progress Notes (Signed)
  [facility] OFFICE PROGRESS NOTE  Patient Care Team: [place] Rock HERO, MD as PCP - General (Family Medicine) End, Lonni, MD as PCP - Cardiology (Cardiology) Guinevere File, [name] Rennie Cindy SAUNDERS, MD as Consulting Physician (Oncology) Parris Manna, MD as Consulting Physician (Pulmonary Disease) Damian Therisa HERO, MD as Consulting Physician (Endocrinology)   Cancer Staging  Multiple myeloma not having achieved remission [facility]) Staging form: Plasma Cell Myeloma and Plasma Cell Disorders, AJCC 8th Edition - Clinical: No stage assigned - Unsigned - Clinical: No stage assigned - Unsigned   Oncology History Overview Note  # SEP 2018- MULTIPLE MYELOMA IgALamda [2.5 gm/dl; K/L= 05/8700]; STAGE III [beta 2 microglobulin=5.5] [presented with acute renal failure; anemia; NO hypercalcemia; Skeletal survey-Normal]; BMBx- 45% plasma cells; FISH-POSITIVE 11:14 translocation.[STANDARD-high RISK]/cyto-Normal; SEP 2018- PET- L3 posterior element lesion.   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 No morphologic support for residual myeloma disease. Negative for minimal residual disease by MM-MRD flow cytometry; HOLD REVLIMID  [leg swelling]; AUG 2021-PET scan negative for myeloma; continue to hold Revlimid   # MARCH 2021-diastolic congestive heart failure [Dr.End]  # BMBx- OCT 2021- BMBx- 5% plasmacytosis-however this appears to be more polyclonal  rather than monoclonal-not explain patient worsening anemia.   # OCT 2022- 18th- Dara- Rev-Dex; MARCH, 2024- PROGRESSION bone marrow biopsy 70% plasma cells; PET scan no bone lesions-however conus medullaris uptake-MRI lumbar spine-   # MAY 2024- DISCONTINUE carfilzomib -dexamethasone - Pomalyst ; [rising M-protein; MAY 2024- 1.3. ]   # start 12/27/2022 -Isa-Carfil-Dex;  [decrease dexamethasone  20 weekly- starting 05/04/23- sec to glaucoma].   # OCT 24th, 2024- Bone marrow Biopsy: The findings are consistent with  focal minimal residual lambda restricted plasma cell neoplasm- HOLD treatment sec to glaucoma; and drop in EF AUG 2024- 45-50% [March 2024- 50-55%]  # MARCH 16th, 2025- re-started Isa-Carfil-Dex;  #November 2021-hyperthyroidism/goiter- [D.Bennett/Dr.Solum]-methimazole . S/p Thyroidectomy [Dr.Kim; [facility]-AUG 2022]  --------------------------  # 12/12- RIGHT JUGULAR DVT-x 74m on xarelto ; finished April 2020; September 2020-EGD/dysphagia; Dr. Therisa  # Acute renal failure [Dr.Singh; Proteinuria 1.5gm/day ]; acyclovir Mickeal ------------------------------------------------------------------------------------------------------------   DIAGNOSIS: [ ]  MULTIPLE MYELOMA  STAGE: III/HIGH RISK ;GOALS: CONTROL      Multiple myeloma not having achieved remission (HCC)  11/21/2017 - 04/28/2018 Chemotherapy   Patient is on Treatment Plan : MYELOMA SALVAGE Cyclophosphamide  / Carfilzomib  / Dexamethasone  (CCd) q28d     05/12/2021 - 02/25/2022 Chemotherapy   Patient is on Treatment Plan : MYELOMA RELAPSED REFRACTORY Daratumumab  SQ + Lenalidomide  + Dexamethasone  (DaraRd) q28d     05/12/2021 - 07/23/2022 Chemotherapy   Patient is on Treatment Plan : MYELOMA RELAPSED REFRACTORY Daratumumab  SQ + Lenalidomide  + Dexamethasone  (DaraRd) q28d     11/02/2022 - 11/16/2022 Chemotherapy   Patient is on Treatment Plan : MYELOMA RELAPSED/ REFRACTORY Carfilzomib  D1,8,15 (20/27) + Pomalidomide  + Dexamethasone  (KPd) q28d      12/27/2022 -  Chemotherapy   Patient is on Treatment Plan : MYELOMA Carfilzomib  + Dexamethasone  + Isatuximab  q28d      INTERVAL HISTORY: Ambulating  independently.  Alone.  Brandi [name] 74 y.o.  female pleasant patient above history of relapsed multiple myeloma--currently re-started back after 3 months break [JULY 28th, 2025]  Isa- Carfil-Dex  is here for a follow up.    Discussed the use of AI scribe software for clinical note transcription with the patient, who gave verbal consent to proceed.  History of Present Illness   Brandi [name] is a 74 year old female with multiple myeloma who presents for follow-up due to disease progression and loss of response to current therapy.  She has been receiving treatment for multiple myeloma, which initially demonstrated efficacy. However, recent bone marrow evaluation revealed an increased burden of malignant plasma cells compared to prior assessments, consistent with disease progression and loss of therapeutic response.  She does not report any new symptoms or specific complaints at this visit.       Review of Systems  Constitutional:  Positive for malaise/fatigue. Negative for chills, diaphoresis and fever.  HENT:  Negative for nosebleeds and sore throat.   Eyes:  Negative for double vision.  Respiratory:  Negative for hemoptysis.   Cardiovascular:  Negative for chest pain, palpitations and orthopnea.  Gastrointestinal:  Negative for abdominal pain, blood in stool, constipation, heartburn and melena.  Genitourinary:  Negative for dysuria, frequency and urgency.  Musculoskeletal:  Positive for back pain.  Skin: Negative.  Negative for itching and rash.  Neurological:  Negative for dizziness, tingling, focal weakness, weakness and headaches.  Endo/Heme/Allergies:  Does not bruise/bleed easily.  Psychiatric/Behavioral:  Negative for depression. The patient is not nervous/anxious.     PAST MEDICAL HISTORY :  Past Medical History:   Diagnosis Date   (HFpEF) heart failure with preserved ejection fraction (HCC)    a. 06/2020 Echo: EF 60-65%, no rwma, nl RV size/fxn. Triv MR. Triv TR/AI. Mod elev PASP.   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   Open wound of left thigh 12/15/2023   Pneumonia    Red blood cell antibody positive, compatible PRBC difficult to obtain    S/P autologous bone marrow transplantation (HCC)    Sepsis (HCC) 11/25/2023    PAST SURGICAL HISTORY :   Past Surgical History:  Procedure Laterality Date   ANTERIOR VITRECTOMY Left 10/07/2021   Procedure: ANTERIOR VITRECTOMY;  Surgeon: Mittie Gaskin, MD;  Location: [facility];  Service: Ophthalmology;  Laterality: Left;   CATARACT EXTRACTION W/PHACO Left 10/07/2021   Procedure: CATARACT EXTRACTION PHACO AND INTRAOCULAR LENS PLACEMENT (IOC) LEFT VISION BLUE;  Surgeon: Mittie Gaskin, MD;  Location: [facility];  Service: Ophthalmology;  Laterality: Left;  kit for manual incision 14.56 01:21.4   CATARACT EXTRACTION W/PHACO Right 10/21/2021   Procedure: CATARACT EXTRACTION PHACO AND INTRAOCULAR LENS PLACEMENT (IOC) RIGHT 11.12 01:48.1;  Surgeon: Mittie Gaskin, MD;  Location: [facility];  Service: Ophthalmology;  Laterality: Right;   ESOPHAGOGASTRODUODENOSCOPY (EGD) WITH PROPOFOL  N/A 03/30/2019   Procedure: ESOPHAGOGASTRODUODENOSCOPY (EGD) WITH PROPOFOL ;  Surgeon: Therisa Bi, MD;  Location: [facility];  Service: Gastroenterology;  Laterality: N/A;   INCISION AND DRAINAGE ABSCESS Left 12/01/2023   Procedure: INCISION AND DRAINAGE, ABSCESS;  Surgeon: Lane Shope, MD;  Location: [facility];  Service: General;  Laterality: Left;   IR BONE  MARROW BIOPSY &  ASPIRATION  05/25/2023   IR BONE MARROW BIOPSY & ASPIRATION  07/27/2024   IR FLUORO GUIDE PORT INSERTION RIGHT  12/16/2017   IR IMAGING GUIDED PORT INSERTION  06/15/2021   KNEE ARTHROSCOPY Left 09/03/2022   Procedure: ARTHROSCOPY KNEE DEBRIDEMENT;  Surgeon: Leora Lynwood SAUNDERS, MD;  Location: [facility] OR[facility];  Service: Orthopedics;  Laterality: Left;   THYROIDECTOMY N/A     FAMILY HISTORY :   Family History  Problem Relation Age of Onset   Pneumonia Mother    Seizures Father     SOCIAL HISTORY:   Social History   Tobacco Use   Smoking status: Former    Current packs/day: 0.00    Average packs/day: 0.3 packs/day    Types: Cigarettes    Quit date: 03/18/2021    Years since quitting: 3.3   Smokeless tobacco: Never   Tobacco comments:    2 cigarettes every other ijb:Dfnxpwh Currently   Vaping Use   Vaping status: Never Used  Substance Use Topics   Alcohol use: No   Drug use: No    ALLERGIES:  has no known allergies.  MEDICATIONS:  Current Outpatient Medications  Medication Sig Dispense Refill   acyclovir  (ZOVIRAX ) 400 MG tablet Take 1 tablet (400 mg total) by mouth 2 (two) times daily. 120 tablet 2   albuterol  (VENTOLIN  HFA) 108 (90 Base) MCG/ACT inhaler Inhale 2 puffs into the lungs every 6 (six) hours as needed for wheezing or shortness of breath. 8 g 3   aspirin  EC 81 MG EC tablet Take 1 tablet (81 mg total) by mouth daily. 90 tablet 3   brimonidine -timolol  (COMBIGAN ) 0.2-0.5 % ophthalmic solution Apply 1 drop to eye 2 (two) times daily.     Brinzolamide -Brimonidine  (SIMBRINZA) 1-0.2 % SUSP Apply 1 drop to eye in the morning and at bedtime. I drop both eyes twice a day     Calcium  Carb-Cholecalciferol 600-10 MG-MCG TABS Take 1 tablet by mouth daily.     calcium  carbonate (CALCIUM  600 HIGH POTENCY) 600 MG TABS tablet Take 2 tablets (1,200 mg total) by mouth 2 (two) times daily. 120 tablet 2   dorzolamide (TRUSOPT) 2 % ophthalmic solution 1 drop 2 (two) times daily.      furosemide  (LASIX ) 40 MG tablet Take 1 tablet (40 mg total) by mouth daily as needed for edema or fluid. 30 tablet 1   levothyroxine  (SYNTHROID ) 150 MCG tablet Take 1 tablet (150 mcg total) by mouth daily. For thyroid  30 tablet 1   lidocaine -prilocaine  (EMLA ) cream Apply 1 application topically as needed. Apply to port and cover with saran wrap 1-2 hours prior to port access 30 g 1   magnesium  oxide (MAG-OX) 400 (240 Mg) MG tablet Take 1 tablet (400 mg total) by mouth 2 (two) times daily. 90 tablet 1   montelukast  (SINGULAIR ) 10 MG tablet Take 1 tablet (10 mg total) by mouth at bedtime. 30 tablet 3   NIFEdipine  (ADALAT  CC) 30 MG 24 hr tablet Take 1 tablet (30 mg total) by mouth daily. 90 tablet 1   pantoprazole  (PROTONIX ) 40 MG tablet Take 1 tablet (40 mg total) by mouth daily. 90 tablet 0   polyethylene glycol (MIRALAX  / GLYCOLAX ) packet Take 17 g by mouth daily as needed for mild constipation. 14 each 0   spironolactone  (ALDACTONE ) 25 MG tablet Take 1 tablet (25 mg total) by mouth daily as needed (edema). 90 tablet 1   traMADol  (ULTRAM ) 50 MG tablet Take 1 tablet (50 mg total) by mouth every  12 (twelve) hours as needed. 60 tablet 0   Vitamin D , Ergocalciferol , (DRISDOL ) 1.25 MG (50000 UNIT) CAPS capsule Take 1 capsule (50,000 Units total) by mouth once a week. 12 capsule 0   No current facility-administered medications for this visit.   Facility-Administered Medications Ordered in Other Visits  Medication Dose Route Frequency Provider Last Rate Last Admin   0.9 %  sodium chloride  infusion   Intravenous Once Breanne Olvera R, MD       carfilzomib  (KYPROLIS ) 100 mg in dextrose  5 % 100 mL chemo infusion  56 mg/m2 (Treatment Plan Recorded) Intravenous Once Amellia Panik R, MD       famotidine  (PEPCID ) IVPB 20 mg premix  20 mg Intravenous Once Lonell Stamos R, MD 200 mL/hr at 08/07/24 1036 20 mg at 08/07/24 1036   isatuximab -irfc (SARCLISA ) 700 mg in sodium chloride  0.9 % 215 mL (2.8  mg/mL) chemo infusion  10 mg/kg (Treatment Plan Recorded) Intravenous Once Galileah Piggee R, MD        PHYSICAL EXAMINATION:   BP 132/76 (BP Location: Right Arm, Patient Position: Sitting, Cuff Size: Normal)   Pulse 83   Temp 98.3 F (36.8 C) (Tympanic)   Resp 16   Ht 5' 2 (1.575 m)   Wt 165 lb 4.8 oz (75 kg)   SpO2 99%   BMI 30.23 kg/m   Filed Weights   08/07/24 0849  Weight: 165 lb 4.8 oz (75 kg)    Abdominals abscess-healing/spontaneously drained.SABRA  Physical Exam HENT:     Head: Normocephalic and atraumatic.  Eyes:     Pupils: Pupils are equal, round, and reactive to light.  Cardiovascular:     Rate and Rhythm: Normal rate and regular rhythm.  Pulmonary:     Effort: Pulmonary effort is normal. No respiratory distress.     Breath sounds: Normal breath sounds. No wheezing.  Abdominal:     General: Bowel sounds are normal. There is no distension.     Palpations: Abdomen is soft. There is no mass.     Tenderness: There is no abdominal tenderness. There is no guarding or rebound.  Musculoskeletal:        General: No tenderness. Normal range of motion.     Cervical back: Normal range of motion and neck supple.  Skin:    General: Skin is warm.  Neurological:     Mental Status: She is alert and oriented to person, place, and time.  Psychiatric:        Mood and Affect: Affect normal.    LABORATORY DATA:  I have reviewed the data as listed    Component Value Date/Time   NA 143 08/07/2024 0902   NA 141 01/25/2020 1530   K 3.7 08/07/2024 0902   CL 107 08/07/2024 0902   CO2 23 08/07/2024 0902   GLUCOSE 93 08/07/2024 0902   BUN 11 08/07/2024 0902   BUN 19 01/25/2020 1530   CREATININE 0.76 08/07/2024 0902   CALCIUM  9.3 08/07/2024 0902   PROT 7.6 08/07/2024 0902   ALBUMIN 4.3 08/07/2024 0902   AST 23 08/07/2024 0902   ALT 21 08/07/2024 0902   ALKPHOS 71 08/07/2024 0902   BILITOT 0.5 08/07/2024 0902   GFRNONAA >60 08/07/2024 0902   GFRAA NOT CALCULATED  04/17/2020 1352    No results found for: SPEP, UPEP  Lab Results  Component Value Date   WBC 4.0 08/07/2024   NEUTROABS 2.2 08/07/2024   HGB 12.1 08/07/2024   HCT 36.6 08/07/2024  MCV 96.1 08/07/2024   PLT 101 (L) 08/07/2024      Chemistry      Component Value Date/Time   NA 143 08/07/2024 0902   NA 141 01/25/2020 1530   K 3.7 08/07/2024 0902   CL 107 08/07/2024 0902   CO2 23 08/07/2024 0902   BUN 11 08/07/2024 0902   BUN 19 01/25/2020 1530   CREATININE 0.76 08/07/2024 0902      Component Value Date/Time   CALCIUM  9.3 08/07/2024 0902   ALKPHOS 71 08/07/2024 0902   AST 23 08/07/2024 0902   ALT 21 08/07/2024 0902   BILITOT 0.5 08/07/2024 0902      (H): Data is abnormally high   Latest Reference Range & Units Most Recent 12/24/20 14:01 04/01/21 14:34 04/28/21 09:17 06/11/21 09:42 07/03/21 10:32 08/20/21 10:06  M Protein SerPl Elph-Mcnc Not Observed g/dL 1.1 (H) (C) 8/73/76 89:93 Not Observed (C) 0.6 (H) (C) 0.7 (H) (C) 0.5 (H) (C) 0.7 (H) (C) 1.1 (H) (C)    Latest Reference Range & Units Most Recent 09/17/21 08:47 10/15/21 08:25 11/12/21 08:55  Kappa free light chain 3.3 - 19.4 mg/L 5.0 11/12/21 08:55 7.2 7.2 5.0  Lambda free light chains 5.7 - 26.3 mg/L 223.6 (H) 11/12/21 08:55 215.3 (H) 340.2 (H) 223.6 (H)  Kappa, lambda light chain ratio 0.26 - 1.65  0.02 (L) 11/12/21 08:55 0.03 (L) 0.02 (L) 0.02 (L)  (H): Data is abnormally high (L): Data is abnormally low  RADIOGRAPHIC STUDIES: I have personally reviewed the radiological images as listed and agreed with the findings in the report. No results found.    ASSESSMENT & PLAN:  Multiple myeloma not having achieved remission (HCC) # RECURRENT Multiple myeloma stage III [high-risk cytogenetics-status post KRD-VGPR ; status post autologous stem cell transplant on 06/15/18.   MAY 2024- 1.3. ]  currently on Isa-Dex; d-2 carfil weekly q 28 days. # MARCH 2025- M-protein -0.3 ; kappa lambda light chain ratio-1.8 lambda  light chain 63-slowly RISING FEB 2025-chemotherapy was interrupted-until MAY 2025- because of thigh infection needing wound VAC.    # S/p re-evaluation at [facility] in June 2025- Labs c/w early progression. No evidence of end organ damage-  PET SEP 2025-  No findings of active myeloma.  low-grade activity in a 1.5 cm partially sclerotic lesion in the right distal femoral metaphysis, probably an enchondroma or similar incidental benign lesion. The appearance is not characteristic for myeloma. # JAN 6th, 2026- bone marrow biopsy: Mildly hypercellular bone marrow (40% with involvement by the  patient's known plasma cell myeloma at approximately 40% of the overall cellular marrow. PROGRESSION NOTED.   # Given the progressive disease noted in the bone marrow-recommend further evaluation at [facility]  for CAR-T/bispecific's. Discussed with patient and also Dr.gasperetto- awaiting Appt on in next 1-2 weeks, 2026  # For now continue with with  Isa-Dex-Carfil- Labs-CBC/chemistries were reviewed with the patient.    # DROP in EF:  MARCH 2024-Echo [[facility]] Left ventricular EF 50 to 55%;  ECHO  04/23/2024 50-55%-06/2024- 50-555 EF- stable.   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    # Anemia- JAn 2025- I ron sat- 8%; ferritin- 19 on prn venofer - stable.    # Electrolyte: Hypokalemia: mild-  continue compliance Kdur TID-; hypocalcemia- 8.0- ? Causing cramps- recommend ca OTC BID   # Iatrogenic hypothyroidism [ Graves/goiter s/p total thyroidectomy;aug,2022 ]-currently on Synthroid  175 mcg/day-; will decrease the dose to 150 -  TSH <  0.01- will repeat- in few weeks. Will repeat TSH today  # Glaucoma BIL- L > R [Dr.King;  eye] - ? Steroid induced on eye drops. decreased the steroids. Discussed with Dr.Haines, ophthalmology- Glaucoma under control- s/p surgery for retinal detachment-on 11/12. Stable.   # chronic  Lower back discomfort - Continue tramadol -  refilled Stable.   # Bone lesions /last zometa  on 11/27/2017.  S/p   Dental extraction- on dentures-  PET scan- NO evidence of any bone lesions. ? Zometa   given risk of ONJ.Stable. .   # Infection prophylaxis: Acyclovir ; asprin- add IVIG infusion [IgG < 400]- plan  IVIG- q 4 w #1 [sep 2025]; Hx  MAY 2025- thigh infection s/p wound VAC [Dr.Rodenberg]  prn  IVIG prn/monthly- NOV IgG- 480- recurrent abdominal abccess- Continue IVIG infusion-   # IV access: mediport- functional  # ACP:  interested in continue current scope of therapy; and full code.  6 w- given dental extrac- on 1/26. :MM panel; K/l light chain ratio q 4 W  ; NO zometa - dental issues-ok; but hypocalemia  Starting aug 25th- q2w-MD: D-isa-dex;car1&2-car; weekly x 3; week 4-OFF;  q 28 day cycle- pushed off to 6 weeks- 11/04; IVIG q M- - 12/18- PS:  # DISPOSITION: # ADD TSH to labs today # chemo today; and tomorrow as planned # order standing orders for 4 weeks- MM panel/k-l light chains- # follow up as per IS-  -Dr.B   No orders of the defined types were placed in this encounter.  All questions were answered. The patient knows to call the clinic with any problems, questions or concerns.      Cindy JONELLE Joe, MD 08/07/2024 10:43 AM End

## 2024-08-07 NOTE — Assessment & Plan Note (Addendum)
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   MAY 2024- 1.3. ]  currently on Isa-Dex; d-2 carfil weekly q 28 days. # MARCH 2025- M-protein -0.3 ; kappa lambda light chain ratio-1.8 lambda light chain 63-slowly RISING FEB 2025-chemotherapy was interrupted-until MAY 2025- because of thigh infection needing wound VAC.    # S/p re-evaluation at [facility] in June 2025- Labs c/w early progression. No evidence of end organ damage-  PET SEP 2025-  No findings of active myeloma.  low-grade activity in a 1.5 cm partially sclerotic lesion in the right distal femoral metaphysis, probably an enchondroma or similar incidental benign lesion. The appearance is not characteristic for myeloma. # JAN 6th, 2026- bone marrow biopsy: Mildly hypercellular bone marrow (40% with involvement by the  patient's known plasma cell myeloma at approximately 40% of the overall cellular marrow. PROGRESSION NOTED.   # Given the progressive disease noted in the bone marrow-recommend further evaluation at [facility]  for CAR-T/bispecific's. Discussed with patient and also Dr.gasperetto- awaiting Appt on in next 1-2 weeks, 2026  # For now continue with with  Isa-Dex-Carfil- Labs-CBC/chemistries were reviewed with the patient.    # DROP in EF:  MARCH 2024-Echo [[facility]] Left ventricular EF 50 to 55%;  ECHO  04/23/2024 50-55%-06/2024- 50-555 EF- stable.   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    # Anemia- JAn 2025- I ron sat- 8%; ferritin- 19 on prn venofer - stable.    # Electrolyte: Hypokalemia: mild-  continue compliance Kdur TID-; hypocalcemia- 8.0- ? Causing cramps- recommend ca OTC BID   # Iatrogenic hypothyroidism [ Graves/goiter s/p total thyroidectomy;aug,2022 ]-currently on Synthroid  175 mcg/day-; will decrease the dose to 150 -  TSH < 0.01- will repeat- in few weeks. Will repeat TSH  today  # Glaucoma BIL- L > R [Dr.King; [facility]] - ? Steroid induced on eye drops. decreased the steroids. Discussed with Dr.Haines, ophthalmology- Glaucoma under control- s/p surgery for retinal detachment-on 11/12. Stable.   # chronic  Lower back discomfort - Continue tramadol - refilled Stable.   # Bone lesions /last zometa  on 11/27/2017.  S/p   Dental extraction- on dentures-  PET scan- NO evidence of any bone lesions. ? Zometa   given risk of ONJ.Stable. .   # Infection prophylaxis: Acyclovir ; asprin- add IVIG infusion [IgG < 400]- plan  IVIG- q 4 w #1 [sep 2025]; Hx  MAY 2025- thigh infection s/p wound VAC [Dr.Rodenberg]  prn  IVIG prn/monthly- NOV IgG- 480- recurrent abdominal abccess- Continue IVIG infusion-   # IV access: mediport- functional  # ACP:  interested in continue current scope of therapy; and full code.  6 w- given dental extrac- on 1/26. :MM panel; K/l light chain ratio q 4 W  ; NO zometa - dental issues-ok; but hypocalemia  Starting aug 25th- q2w-MD: D-isa-dex;car1&2-car; weekly x 3; week 4-OFF;  q 28 day cycle- pushed off to 6 weeks- 11/04; IVIG q M- - 12/18- PS:  # DISPOSITION: # ADD TSH to labs today # chemo today; and tomorrow as planned # order standing orders for 4 weeks- MM panel/k-l light chains- # follow up as per IS-  -Dr.B

## 2024-08-07 NOTE — Patient Instructions (Signed)

## 2024-08-07 NOTE — Progress Notes (Signed)
 07/28/23 BM bx.

## 2024-08-08 ENCOUNTER — Encounter (HOSPITAL_COMMUNITY): Payer: Self-pay | Admitting: Internal Medicine

## 2024-08-08 ENCOUNTER — Encounter: Payer: Self-pay | Admitting: Internal Medicine

## 2024-08-08 ENCOUNTER — Other Ambulatory Visit: Payer: Self-pay | Admitting: Internal Medicine

## 2024-08-08 ENCOUNTER — Other Ambulatory Visit (HOSPITAL_COMMUNITY): Payer: Self-pay

## 2024-08-08 ENCOUNTER — Inpatient Hospital Stay

## 2024-08-08 ENCOUNTER — Telehealth: Payer: Self-pay

## 2024-08-08 ENCOUNTER — Telehealth: Payer: Self-pay | Admitting: Pharmacy Technician

## 2024-08-08 VITALS — BP 135/65 | HR 67 | Temp 98.3°F | Resp 16

## 2024-08-08 DIAGNOSIS — C9000 Multiple myeloma not having achieved remission: Secondary | ICD-10-CM

## 2024-08-08 DIAGNOSIS — Z5112 Encounter for antineoplastic immunotherapy: Secondary | ICD-10-CM | POA: Diagnosis not present

## 2024-08-08 LAB — THYROID PANEL WITH TSH
Free Thyroxine Index: 3.1 (ref 1.2–4.9)
T3 Uptake Ratio: 28 % (ref 24–39)
T4, Total: 11.2 ug/dL (ref 4.5–12.0)
TSH: 2.6 u[IU]/mL (ref 0.450–4.500)

## 2024-08-08 MED ORDER — SODIUM CHLORIDE 0.9 % IV SOLN
Freq: Once | INTRAVENOUS | Status: AC
  Filled 2024-08-08: qty 250

## 2024-08-08 MED ORDER — DEXAMETHASONE SOD PHOSPHATE PF 10 MG/ML IJ SOLN
5.0000 mg | Freq: Once | INTRAMUSCULAR | Status: AC
  Administered 2024-08-08: 5 mg via INTRAVENOUS
  Filled 2024-08-08: qty 1

## 2024-08-08 MED ORDER — DEXTROSE 5 % IV SOLN
56.0000 mg/m2 | Freq: Once | INTRAVENOUS | Status: DC
  Administered 2024-08-08: 100 mg via INTRAVENOUS
  Filled 2024-08-08: qty 30

## 2024-08-08 NOTE — Progress Notes (Signed)
 Pharmacist Chemotherapy Monitoring - Initial Assessment    Anticipated start date: 08/22/24   The following has been reviewed per standard work regarding the patient's treatment regimen: The patient's diagnosis, treatment plan and drug doses, and organ/hematologic function Lab orders and baseline tests specific to treatment regimen  The treatment plan start date, drug sequencing, and pre-medications Prior authorization status  Patient's documented medication list, including drug-drug interaction screen and prescriptions for anti-emetics and supportive care specific to the treatment regimen The drug concentrations, fluid compatibility, administration routes, and timing of the medications to be used The patient's access for treatment and lifetime cumulative dose history, if applicable  The patient's medication allergies and previous infusion related reactions, if applicable  # RECURRENT Multiple myeloma stage III  Change to sarclisa  /pomalidomide  Changes made to treatment plan:  N/A  Follow up needed:  Pending authorization for treatment    Redell JINNY Gaskins, [facility], 08/08/2024  2:49 PM

## 2024-08-08 NOTE — Patient Instructions (Signed)
 [facility] - A DEPT OF [facility]  Discharge Instructions: Thank you for choosing [facility] to provide your oncology and hematology care.  If you have a lab appointment with the Cancer Center, please go directly to the Cancer Center and check in at the registration area.  Wear comfortable clothing and clothing appropriate for easy access to any Portacath or PICC line.   We strive to give you quality time with your provider. You may need to reschedule your appointment if you arrive late (15 or more minutes).  Arriving late affects you and other patients whose appointments are after yours.  Also, if you miss three or more appointments without notifying the office, you may be dismissed from the clinic at the provider's discretion.      For prescription refill requests, have your pharmacy contact our office and allow 72 hours for refills to be completed.    Today you received the following chemotherapy and/or immunotherapy agents Kyprolis      To help prevent nausea and vomiting after your treatment, we encourage you to take your nausea medication as directed.  BELOW ARE SYMPTOMS THAT SHOULD BE REPORTED IMMEDIATELY: *FEVER GREATER THAN 100.4 F (38 C) OR HIGHER *CHILLS OR SWEATING *NAUSEA AND VOMITING THAT IS NOT CONTROLLED WITH YOUR NAUSEA MEDICATION *UNUSUAL SHORTNESS OF BREATH *UNUSUAL BRUISING OR BLEEDING *URINARY PROBLEMS (pain or burning when urinating, or frequent urination) *BOWEL PROBLEMS (unusual diarrhea, constipation, pain near the anus) TENDERNESS IN MOUTH AND THROAT WITH OR WITHOUT PRESENCE OF ULCERS (sore throat, sores in mouth, or a toothache) UNUSUAL RASH, SWELLING OR PAIN  UNUSUAL VAGINAL DISCHARGE OR ITCHING   Items with * indicate a potential emergency and should be followed up as soon as possible or go to the Emergency Department if any problems should occur.  Please show the CHEMOTHERAPY ALERT CARD or IMMUNOTHERAPY  ALERT CARD at check-in to the Emergency Department and triage nurse.  Should you have questions after your visit or need to cancel or reschedule your appointment, please contact [facility] - A DEPT OF [facility]  [phone] and follow the prompts.  Office hours are 8:00 a.m. to 4:30 p.m. Monday - Friday. Please note that voicemails left after 4:00 p.m. may not be returned until the following business day.  We are closed weekends and major holidays. You have access to a nurse at all times for urgent questions. Please call the main number to the clinic [phone] and follow the prompts.  For any non-urgent questions, you may also contact your provider using MyChart. We now offer e-Visits for anyone 36 and older to request care online for non-urgent symptoms. For details visit [email or web address].   Also download the MyChart app! Go to the app store, search "MyChart", open the app, select , and log in with your MyChart username and password.

## 2024-08-08 NOTE — Progress Notes (Signed)
 DISCONTINUE ON PATHWAY REGIMEN - Multiple Myeloma and Other Plasma Cell Dyscrasias     Cycle 1: A cycle is 28 days:     Dexamethasone       Isatuximab -irfc      Carfilzomib       Carfilzomib     Cycles 2 and beyond: A cycle is every 28 days:     Dexamethasone       Isatuximab -irfc      Carfilzomib    **Always confirm dose/schedule in your pharmacy ordering system**  PRIOR TREATMENT: [id number]: Isatuximab  + Carfilzomib  + Dexamethasone  q28 Days Until Progression or Unacceptable Toxicity  START ON PATHWAY REGIMEN - Multiple Myeloma and Other Plasma Cell Dyscrasias     Cycle 1: A cycle is 28 days:     Pomalidomide       Dexamethasone       Isatuximab -irfc    Cycles 2 and beyond: A cycle is every 28 days:     Pomalidomide       Dexamethasone       Isatuximab -irfc   **Always confirm dose/schedule in your pharmacy ordering system**  Patient Characteristics: Multiple Myeloma, Relapsed / Refractory, Second through Fourth Lines of Therapy, Not a Candidate for CAR T-cell Therapy, Fit or Candidate for Triplet Therapy, Lenalidomide -Refractory or Lenalidomide -based Regimen Not Preferred, Candidate for Anti-CD38  Antibody Disease Classification: Multiple Myeloma Therapeutic Status: Refractory R2-ISS Staging: III Line of Therapy: Third Line Lenalidomide -based Regimen Preference/Candidacy: Lenalidomide -Refractory Anti-CD38 Antibody Candidacy: Candidate for Anti-CD38 Antibody Intent of Therapy: Non-Curative / Palliative Intent, Discussed with Patient

## 2024-08-08 NOTE — Telephone Encounter (Signed)
 Oral Oncology Patient Advocate Encounter   New authorization   Received notification that prior authorization for venclexta  is required.   PA submitted on CMM via Latent Key [id number] Status is pending     Telitha Plath (Patty) Chet Burnet, CPhT  [facility], Zelda Salmon, [facility] - Oral Chemotherapy Patient Advocate Specialist III Phone: [phone]  Fax: [phone]

## 2024-08-08 NOTE — Telephone Encounter (Signed)
 PGY2 Oncology Pharmacy Resident Encounter - Oral Chemo Assessment  Received new prescription for Venclexta  (venetoclax ) for the treatment of relapse multiple myeloma positive for t(11;14) in conjunction with isatuximab  and dexamethasone  planned as a bridging therapy to future immunotherapy.  Oral Chemo Regimen  Medication/dosing schedule: Start 200 mg daily for 1x week, then 300 mg daily for 1x week, then 400 mg daily.  REMs program: No  Prescription dose and frequency assessed: Yes, provider starting with 200 mg and ramping up to 400 mg.   Labs/Baseline Monitoring   Required baseline labs: CBC, CMP  Labs from 08/07/2024 assessed, pertinent labs WNL.  Renal/hepatic function: WNL, CrCl > 60 mL/min Plan: No dose adjustments indicated at this time   Current Medications  Current medication list reviewed in Epic 0 DDIs associated with Venclexta  (venetoclax ) identified  Supportive Care Needs TLS, low risk  No indicated for this patient  HSV prophylaxis  Confirm patient is still taking acyclovir    Logistics and Coordination  Evaluated chart and no patient barriers to medication adherence identified.  Prescription has been e-scribed to the [facility] for benefits analysis and approval. Oral Oncology Clinic will continue to follow for insurance authorization, copayment issues, initial counseling and start date.  Thank you for allowing pharmacy to participate in this patient's care.  Alfonso MARLA Buys, PharmD Pharmacy Resident  08/08/2024 3:35 PM

## 2024-08-08 NOTE — Progress Notes (Signed)
 Patient arrived to receive day 2 Kyprolis  100 mg chemo infusion. Medication was released and prepared. Chemotherapy treatment regimen was changed to start new regimen next week before Kyprolis  was administered. After discussion with Dr. Rennie, patient to still receive last dose of Kyprolis  today as MD thought patient had already received today's dose before treatment plan changes were made. Additional due time added to [facility] for administration of Kprolis 100 mg dose 08/08/24    Maudie FORBES Andreas, PharmD, BCPS Clinical Pharmacist

## 2024-08-09 ENCOUNTER — Other Ambulatory Visit (HOSPITAL_COMMUNITY): Payer: Self-pay

## 2024-08-09 ENCOUNTER — Encounter: Payer: Self-pay | Admitting: Internal Medicine

## 2024-08-09 MED ORDER — VENETOCLAX 100 MG PO TABS
ORAL_TABLET | ORAL | 0 refills | Status: AC
  Filled 2024-08-20: qty 84, 26d supply, fill #0

## 2024-08-09 NOTE — Telephone Encounter (Signed)
 Oral Oncology Patient Advocate Encounter  Prior Authorization for Venclexta  has been approved.    PA# [id number] Effective dates: 08/08/2024 through 07/25/2025  Patients co-pay is $4.48.    Brandi [name] (Patty) Chet Burnet, CPhT  [facility], Zel[facility] [place]mon, [facility] - Oral Chemotherapy Patient Advocate Specialist III Phone: [phone]  Fax: [phone]

## 2024-08-10 NOTE — Progress Notes (Addendum)
 Pharmacist Chemotherapy Monitoring - Initial Assessment    Anticipated start date: 08/21/24   The following has been reviewed per standard work regarding the patient's treatment regimen: The patient's diagnosis, treatment plan and drug doses, and organ/hematologic function Lab orders and baseline tests specific to treatment regimen  The treatment plan start date, drug sequencing, and pre-medications Prior authorization status  Patient's documented medication list, including drug-drug interaction screen and prescriptions for anti-emetics and supportive care specific to the treatment regimen The drug concentrations, fluid compatibility, administration routes, and timing of the medications to be used The patient's access for treatment and lifetime cumulative dose history, if applicable  The patient's medication allergies and previous infusion related reactions, if applicable   Changes made to treatment plan:  Infusion rate - changed to 200 mL/hr as patient was previusly on Sarclisa  and will not need to be re-titrated Updated infusion chair times to reflect 200 mL/hour infusion rate    Follow up needed:  Pending authorization for treatment    Brandi [name], [facility], 08/10/2024  2:52 PM

## 2024-08-14 ENCOUNTER — Inpatient Hospital Stay: Admitting: Pharmacist

## 2024-08-14 ENCOUNTER — Inpatient Hospital Stay

## 2024-08-17 ENCOUNTER — Encounter: Payer: Self-pay | Admitting: Internal Medicine

## 2024-08-17 NOTE — Discharge Summary (Signed)
 " Physician Discharge Summary HBR 4 BT1 HBR [address] [place] [address] Dept: [phone] Loc: [phone]   Identifying Information:  Brandi [name] 02-16-1951 [id number]  Primary Care Physician: Rennie Cindy Bray, MD   Code Status: Full Code  Admit Date: 08/15/2024  Discharge Date: 08/17/2024   Discharge To: Home  Discharge Service: HBR - HBR: Hospitalist [facility]   Discharge Attending Physician: No att. providers found  Discharge Diagnoses: Principal Problem:   CAP (community acquired pneumonia) (POA: Yes) Active Problems:   Chronic combined systolic and diastolic congestive heart failure (CMS-HCC) (POA: Yes)   Essential hypertension (POA: Yes)   IgA myeloma    (CMS-HCC) (POA: Yes)   Rhinovirus infection (POA: Yes)   Tobacco use disorder (POA: Yes) Resolved Problems:   * No resolved hospital problems. *   Outpatient Provider Follow Up Issues:  Follow up with PCP and oncology as needed  Hospital Course: Brandi [name] is a 74 y.o. female who is presenting to [facility] with CAP (community acquired pneumonia), in the setting of the following pertinent/contributing co-morbidities: chronic CHF, COPD, HTN, multiple myeloma (on chemotherapy at OSH) .     CAP (community acquired pneumonia) rhinovirus bronchitis in immunocompromised patient Given patient's immunocompromised status treated pt for probable CAP. Reassuringly no oxygen requirement, no expiratory wheezing to suggest COPD exacerbation.  She received azithromycin , ceftriaxone  x days then transitioned to oral Levo 750 x 3 days   Secondary/Additional Active Problems <redacted file path>: Multiple myeloma s/p stem cell transplant 05/2018: Multiple myeloma stage III [high-risk cytogenetics-status post KRD-VGPR ; status post autologous stem cell transplant on 06/15/18.   MAY 2024- 1.3. ]  currently on Isa-Dex; d-2 carfil weekly q 28 days. Last chemo 1/13. She will follow up with  primary oncologist and continue home tramadol  prn   Hypothyroidism:  - cont home levothyroxine    Chronic combined systolic and diastolic CHF:  No evidence of exacerbation during this admission.  - will continue her home lasix  40mg  daily and nifedipine    Procedures: No admission procedures for hospital encounter. ______________________________________________________________________ Discharge Medications:   Your Medication List     STOP taking these medications    amlodipine  5 MG tablet Commonly known as: NORVASC        START taking these medications    levoFLOXacin  750 MG tablet Commonly known as: LEVAQUIN  Take 1 tablet (750 mg total) by mouth daily for 3 days.       CONTINUE taking these medications    acyclovir  400 MG tablet Commonly known as: ZOVIRAX  TAKE 1 TABLET BY MOUTH ONCE DAILY TO PREVENT SHINGLES   albuterol  90 mcg/actuation inhaler Commonly known as: PROVENTIL  HFA Inhale 2 puffs every four (4) hours as needed for wheezing or shortness of breath. Use with a spacer.   aspirin  81 MG tablet Commonly known as: ECOTRIN Take 81 mg by mouth.   calcium  carbonate-vitamin D3 600 mg-5 mcg (200 unit) per tablet Take 1 tablet by mouth in the morning.   ergocalciferol  1,250 mcg (50,000 unit) capsule Commonly known as: DRISDOL  Take 1 capsule by mouth once a week.   furosemide  40 MG tablet Commonly known as: LASIX  Take 40 mg by mouth.   ipratropium-albuterol  20-100 mcg/actuation Mist inhaler Commonly known as: COMBIVENT Inhale 1 puff four (4) times a day as needed.   levothyroxine  112 MCG tablet Commonly known as: SYNTHROID  Take 1 tablet (112 mcg total) by mouth daily.   metoPROLOL  succinate 25 MG 24 hr tablet Commonly known as: Toprol -XL Take 1.5 tablets by  mouth in the morning.   montelukast  10 mg tablet Commonly known as: SINGULAIR  Take 10 mg by mouth in the morning.   NIFEdipine  30 MG 24 hr tablet Commonly known as: ADALAT  CC Take 1 tablet (30  mg total) by mouth daily. TAKE 1 TABLET (30 MG TOTAL) BY MOUTH DAILY.   pantoprazole  40 MG tablet Commonly known as: Protonix  Take 40 mg by mouth.   spironolactone  25 MG tablet Commonly known as: ALDACTONE  Take 25 mg by mouth in the morning.   traMADol  50 mg tablet Commonly known as: ULTRAM  Take 1 tablet (50 mg total) by mouth two (2) times a day as needed for severe pain.        Allergies: Patient has no known allergies. ______________________________________________________________________ Pending Test Results (if blank, then none):   Most Recent Labs: All lab results last 24 hours - No results found for this or any previous visit (from the past 24 hours).  Relevant Studies/Radiology (if blank, then none): ECG 12 Lead Result Date: 08/16/2024 NORMAL SINUS RHYTHM NONSPECIFIC ST AND T WAVE ABNORMALITY BORDERLINE ECG WHEN COMPARED WITH ECG OF 27-Aug-2023 07:30, NONSPECIFIC T WAVE ABNORMALITY NOW EVIDENT IN LATERAL LEADS Confirmed by Lennie Cough [id number]) on 08/16/2024 4:38:24 PM  CTA Chest W Contrast Result Date: 08/15/2024 EXAM: CTA CHEST W CONTRAST ACCESSION: [id number] UN REPORT DATE: 08/15/2024 11:01 PM CLINICAL INDICATION: hemoptysis TECHNIQUE: Contiguous axial images were reconstructed through the chest following a single breath hold helical acquisition during the administration of intravenous contrast material. Images were reformatted in the coronal and sagittal planes. MIP slabs were also constructed. COMPARISON: Same-day chest radiograph FINDINGS: PULMONARY ARTERIES: No embolus in either lung. Main pulmonary artery is normal in size. HEART/VASCULATURE: Cardiac chambers are normal in size. Mild coronary artery calcifications. No pericardial effusion. Tortuous aorta is normal in caliber with moderate calcifications. LUNGS/AIRWAYS/PLEURA: Trachea and large airways are patent. Scattered areas of impacted secretions seen within the distal airways, for example within the right lower  lobe (5:176, 5:109). Diffuse bronchial wall thickening. Multifocal areas of tree-in-bud nodularity with greatest predominance within the right middle lobe (5:125). There are also areas of nodular consolidation seen within bilateral lung bases (5:177, 5:187). No pleural effusion or pneumothorax. MEDIASTINUM/THORACIC INLET: No enlarged supraclavicular or intrathoracic lymph nodes. No thyroid  abnormality. Patulous esophagus is seen with internal secretions. DIAPHRAGM/UPPER ABDOMEN: Diaphragm intact and normally positioned. Small sliding-type hiatal hernia. Peripherally calcified focal outpouching of the splenic artery which measures 0.7 x 0.6 cm (6:125). CHEST WALL/BONES: No enlarged axillary lymph nodes. Normal bones. DEVICES: Right internal jugular port catheter tip is at the level of the superior cavoatrial junction.   1.  Lower-lobe predominant bronchiectasis and bronchiolar mucoid impaction with associated tree-in-bud nodularities, consistent with aspiration pneumonitis or endobronchial infection. 2.  Patulous esophagus, which is fluid-filled. 3.  No pulmonary embolism. 4.  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 This can be further assessed with nonemergent mesenteric CTA on an outpatient basis.   XR Chest 2 views Result Date: 08/15/2024 EXAM: XR CHEST 2 VIEWS ACCESSION: [id number] UN REPORT DATE: 08/15/2024 7:34 PM CLINICAL INDICATION: DYSPNEA ; Dyspnea  TECHNIQUE: PA and Lateral Chest Radiographs COMPARISON: 08/27/2023 chest radiograph FINDINGS: Right port catheter terminates in the central SVC. There is increased interstitial opacification and vascular prominence. Left basilar atelectasis. No pleural effusion or pneumothorax. Normal heart size and mediastinal contours.   1.  Question mild pulmonary edema. 2.  Left basilar atelectasis.  ______________________________________________________________________ Discharge Instructions:  Activity Instructions     Activity  as  tolerated         Diet Instructions     Discharge diet (specify)     Discharge Nutrition Therapy: Regular       Other Instructions     Call MD for:  difficulty breathing, headache or visual disturbances     Call MD for:  persistent dizziness or light-headedness     Call MD for: Temperature > 38.5 Celsius ( > 101.3 Fahrenheit)     Discharge instructions     Discharge Instructions for Pneumonia      You have been diagnosed with pneumonia, a serious lung infection. Most cases of pneumonia are caused by bacteria. Pneumonia most often occurs in older adults, young children, and people with chronic health problems.   Home Care      Take your medication exactly as directed. Don t skip doses. Continue taking your antibiotics as directed until they are all gone even if you start to feel better. This will prevent the pneumonia from coming back.     Drink at least 8 glasses of water daily, unless directed otherwise. This helps to loosen and thin secretions so that you can cough them up.     Use a cool-mist humidifier in your bedroom. Be sure to clean the humidifier daily.     Coughing up mucus is normal. Todd t use medications to suppress your cough unless your cough is dry, painful, or interferes with your sleep. You may use an expectorant if ordered by your doctor.     Learn percussion and postural drainage. These are techniques that can help you cough up extra mucus. This extra mucus can trap germs in your lungs. Ask your healthcare provider for instructions. Perform these techniques 3 times a day until your lungs are clear.     Warm compresses or a moist heating pad on the lowest setting can be used to relieve chest discomfort. Use several times a day for 15-20 minutes at a time. (To prevent injuring your skin, be sure the temperature of the compress or heating pad is warm, not hot.)     Get plenty of rest until your fever, shortness of breath, and chest pain go away.     Plan to get a flu  shot every year.     Ask your doctor about a pneumonia vaccination.  When to Seek Medical Attention      Call 911 right away if you have any of the following:         Chest pain unrelated to coughing         Trouble breathing         Blue lips or fingernails     Otherwise, call your doctor if you have any of the following:         Fever above 101.5 F         Bloody sputum         Vomiting       Follow Up instructions and Outpatient Referrals    Call MD for:  difficulty breathing, headache or visual disturbances     Call MD for:  persistent dizziness or light-headedness     Call MD for: Temperature > 38.5 Celsius ( > 101.3 Fahrenheit)     Discharge instructions        ______________________________________________________________________ Discharge Day Services: BP 108/57   Pulse 69   Temp 37.8 C (100 F) (Oral)   Resp 16   Ht 157.5 cm (5' 2)  Wt 68.6 kg (151 lb 3.8 oz)   SpO2 98%   BMI 27.66 kg/m  Pt seen on the day of discharge and determined appropriate for discharge.  Condition at Discharge: good  Length of Discharge: I spent greater than 30 mins in the discharge of this patient.  "

## 2024-08-20 ENCOUNTER — Telehealth: Payer: Self-pay | Admitting: Pharmacy Technician

## 2024-08-20 ENCOUNTER — Other Ambulatory Visit: Payer: Self-pay

## 2024-08-20 ENCOUNTER — Telehealth: Payer: Self-pay | Admitting: Pharmacist

## 2024-08-20 ENCOUNTER — Other Ambulatory Visit: Payer: Self-pay | Admitting: Pharmacy Technician

## 2024-08-20 NOTE — Progress Notes (Signed)
 Patient education documented in EPIC note on 08/20/24.

## 2024-08-20 NOTE — Telephone Encounter (Signed)
 Oral Oncology Patient Advocate Encounter  Patient successfully OnBoarded and drug education provided by pharmacist. Medication scheduled to be shipped on 01/27 for delivery on 01/29 from [facility] to patient's address. Patient also knows to call me at [phone] with any questions or concerns regarding receiving medication or if there is any unexpected change in co-pay.    Kristin Lamagna (Patty) Chet Burnet, CPhT  [facility], Zelda Salmon, [facility] - Oral Chemotherapy Patient Advocate Specialist III Phone: [phone]  Fax: [phone]

## 2024-08-20 NOTE — Patient Instructions (Signed)
 [facility] - A DEPT OF [facility]    Thank you for choosing [facility] to provide your oncology/hematology care and for allowing us  to participate in your care today!  As a reminder, we spoke about the following today: Venclexta  (venetoclax ) for the treatment of relapse multiple myeloma positive for t(11;14) in conjunction with isatuximab  and dexamethasone  planned as a bridging therapy to future immunotherapy.   Treatment goal: Control  Medication handout has been provided.   **For oral cancer medication prescription refill requests, contact your pharmacy and they will contact our office if needed. Allow 5-7 days for refills to be completed by your specialty pharmacy.    Cancer Center General Instructions:  If you have an appointment at the [facility], please go directly to the Cancer Center and check in at the registration area.  We strive to give you quality time with your provider. You may need to reschedule your appointment if you arrive late (15 or more minutes).  Arriving late affects you and other patients whose appointments are after yours.  Also, if you miss three or more appointments without notifying the office, you may be dismissed from the clinic at the provider's discretion.      BELOW ARE SYMPTOMS THAT SHOULD BE REPORTED IMMEDIATELY: *FEVER GREATER THAN 100.4 F (38 C) OR HIGHER *CHILLS OR SWEATING *NAUSEA AND VOMITING THAT IS NOT CONTROLLED WITH YOUR NAUSEA MEDICATION *UNUSUAL SHORTNESS OF BREATH *UNUSUAL BRUISING OR BLEEDING *URINARY PROBLEMS (pain or burning when urinating, or frequent urination) *BOWEL PROBLEMS (unusual diarrhea, constipation, pain near the anus) TENDERNESS IN MOUTH AND THROAT WITH OR WITHOUT PRESENCE OF ULCERS (sore throat, sores in mouth, or a toothache) UNUSUAL RASH, SWELLING OR PAIN  UNUSUAL VAGINAL DISCHARGE OR ITCHING   Items with * indicate a potential emergency and should be followed up  as soon as possible or go to the Emergency Department if any problems should occur.  Please show the CHEMOTHERAPY ALERT CARD at check-in to the Emergency Department and triage nurse.  Should you have questions after your visit or need to cancel or reschedule your appointment, please contact [facility] - A DEPT OF [facility]  [phone] and follow the prompts.  Office hours are 8:00 a.m. to 4:30 p.m. Monday - Friday. Please note that voicemails left after 4:00 p.m. may not be returned until the following business day.  We are closed weekends and major holidays. You have access to a nurse at all times for urgent questions. Please call the main number to the clinic [phone] and follow the prompts.  For any non-urgent questions, you may also contact your provider using MyChart. We now offer e-Visits for anyone 15 and older to request care online for non-urgent symptoms. For details visit [email or web address].   Also download the MyChart app! Go to the app store, search MyChart, open the app, select , and log in with your MyChart username and password.

## 2024-08-20 NOTE — Progress Notes (Signed)
 Specialty Pharmacy Initial Fill Coordination Note  Brandi [name] is a 74 y.o. female contacted today regarding initial fill of specialty medication(s) Venetoclax  (VENCLEXTA )   Patient requested Delivery   Delivery date: 08/23/24   Verified address: [address]  [place] [address]   Medication will be filled on: 08/21/24   Patient is aware of $4.48 copayment.   Joelle Roswell (Patty) Chet Burnet, CPhT  [facility] Zelda Salmon, [facility] - Oral Chemotherapy Patient Advocate Specialist III Phone: [phone]  Fax: [phone]

## 2024-08-20 NOTE — Progress Notes (Signed)
 Clinical Pharmacist Practitioner Encounter   [facility] (Specialty) will deliver medication to patient on 08/23/24.  Patient knows to start once they have medication in hand.  Patient Education I spoke with patient for overview of new oral chemotherapy medication: Venclexta  (venetoclax ) for the treatment of relapse multiple myeloma positive for t(11;14) in conjunction with isatuximab  and dexamethasone  planned as a bridging therapy to future immunotherapy.   Treatment goal: Control  Counseled patient on administration, dosing, side effects, monitoring, drug-food interactions, safe handling, storage, and disposal. Patient will take 2 tablets (200 mg) by mouth daily for 7 days then take 3 tablets (300 mg) daily for 7 days then take 4 tablets (400 mg) daily thereafter. Tablets should be swallowed whole with a meal and a full glass of water.   Side effects include but not limited to: nausea, diarrhea, fatigue, decreased wbc/hgb/plt. Diarrhea: patient knows to use loperamide as needed and call the office if they are having four or more loose stool per day    Reviewed with patient importance of keeping a medication schedule and plan for any missed doses.  After discussion with patient no patient barriers to medication adherence identified.   Distress evaluation: Distress thermometer not completed during telephone call as patient has been on previous lines of therapy.   Communication and Learning Assessment Primary learner: patient Barriers to learning: No barriers Preferred language: English Learning preferences: Listening Reading  Ms. Riviello voiced understanding and appreciation. All questions answered. Medication handout provided.  Provided patient with Oral Chemotherapy Navigation Clinic phone number. Patient knows to call the office with questions or concerns. Reviewed with patient the expectations for rescheduling or cancelling appointments.  Oral Chemotherapy Navigation  Clinic will continue to follow.  Demetress Tift N. Nethan Caudillo, PharmD, BCOP, CPP Hematology/Oncology Clinical Pharmacist [facility] Oral Chemotherapy Navigation Clinic [phone]  08/20/2024 12:36 PM

## 2024-08-21 ENCOUNTER — Inpatient Hospital Stay

## 2024-08-21 ENCOUNTER — Other Ambulatory Visit: Payer: Self-pay

## 2024-08-21 ENCOUNTER — Other Ambulatory Visit (HOSPITAL_COMMUNITY): Payer: Self-pay

## 2024-08-21 ENCOUNTER — Inpatient Hospital Stay: Admitting: Pharmacist

## 2024-08-21 ENCOUNTER — Telehealth: Payer: Self-pay | Admitting: *Deleted

## 2024-08-21 ENCOUNTER — Inpatient Hospital Stay: Admitting: Internal Medicine

## 2024-08-21 DIAGNOSIS — C9000 Multiple myeloma not having achieved remission: Secondary | ICD-10-CM

## 2024-08-21 NOTE — Telephone Encounter (Signed)
 Patient left vm on triage. She stated that she showed up this morning for her infusion and office was closed. She is calling to r/s apts.

## 2024-08-22 ENCOUNTER — Inpatient Hospital Stay

## 2024-08-22 ENCOUNTER — Other Ambulatory Visit: Payer: Self-pay | Admitting: Internal Medicine

## 2024-08-22 NOTE — Progress Notes (Signed)
 Patient missed appointment-cycle number 1 day 1 ordered for 2/3.

## 2024-08-23 ENCOUNTER — Other Ambulatory Visit (HOSPITAL_COMMUNITY): Payer: Self-pay

## 2024-08-24 ENCOUNTER — Other Ambulatory Visit: Payer: Self-pay | Admitting: *Deleted

## 2024-08-24 DIAGNOSIS — C9000 Multiple myeloma not having achieved remission: Secondary | ICD-10-CM

## 2024-08-28 ENCOUNTER — Inpatient Hospital Stay

## 2024-08-28 ENCOUNTER — Inpatient Hospital Stay: Admitting: Internal Medicine

## 2024-08-28 ENCOUNTER — Encounter: Payer: Self-pay | Admitting: Internal Medicine

## 2024-08-28 ENCOUNTER — Inpatient Hospital Stay: Attending: Internal Medicine | Admitting: Internal Medicine

## 2024-08-28 VITALS — BP 108/67 | HR 75

## 2024-08-28 VITALS — BP 113/64 | HR 87 | Temp 99.0°F | Resp 16 | Ht 62.0 in | Wt 163.9 lb

## 2024-08-28 DIAGNOSIS — C9000 Multiple myeloma not having achieved remission: Secondary | ICD-10-CM

## 2024-08-28 DIAGNOSIS — E032 Hypothyroidism due to medicaments and other exogenous substances: Secondary | ICD-10-CM

## 2024-08-28 DIAGNOSIS — E876 Hypokalemia: Secondary | ICD-10-CM | POA: Diagnosis not present

## 2024-08-28 LAB — CBC WITH DIFFERENTIAL (CANCER CENTER ONLY)
Abs Immature Granulocytes: 0.01 10*3/uL (ref 0.00–0.07)
Basophils Absolute: 0 10*3/uL (ref 0.0–0.1)
Basophils Relative: 0 %
Eosinophils Absolute: 0 10*3/uL (ref 0.0–0.5)
Eosinophils Relative: 1 %
HCT: 30.9 % — ABNORMAL LOW (ref 36.0–46.0)
Hemoglobin: 9.9 g/dL — ABNORMAL LOW (ref 12.0–15.0)
Immature Granulocytes: 0 %
Lymphocytes Relative: 39 %
Lymphs Abs: 2.1 10*3/uL (ref 0.7–4.0)
MCH: 32.4 pg (ref 26.0–34.0)
MCHC: 32 g/dL (ref 30.0–36.0)
MCV: 101 fL — ABNORMAL HIGH (ref 80.0–100.0)
Monocytes Absolute: 0.3 10*3/uL (ref 0.1–1.0)
Monocytes Relative: 6 %
Neutro Abs: 2.9 10*3/uL (ref 1.7–7.7)
Neutrophils Relative %: 54 %
Platelet Count: 259 10*3/uL (ref 150–400)
RBC: 3.06 MIL/uL — ABNORMAL LOW (ref 3.87–5.11)
RDW: 16.1 % — ABNORMAL HIGH (ref 11.5–15.5)
WBC Count: 5.4 10*3/uL (ref 4.0–10.5)
nRBC: 0 % (ref 0.0–0.2)

## 2024-08-28 LAB — CMP (CANCER CENTER ONLY)
ALT: 7 U/L (ref 0–44)
AST: 17 U/L (ref 15–41)
Albumin: 4.2 g/dL (ref 3.5–5.0)
Alkaline Phosphatase: 85 U/L (ref 38–126)
Anion gap: 14 (ref 5–15)
BUN: 13 mg/dL (ref 8–23)
CO2: 22 mmol/L (ref 22–32)
Calcium: 8.9 mg/dL (ref 8.9–10.3)
Chloride: 103 mmol/L (ref 98–111)
Creatinine: 0.79 mg/dL (ref 0.44–1.00)
GFR, Estimated: >60 mL/min
Glucose, Bld: 91 mg/dL (ref 70–99)
Potassium: 3.5 mmol/L (ref 3.5–5.1)
Sodium: 139 mmol/L (ref 135–145)
Total Bilirubin: 0.6 mg/dL (ref 0.0–1.2)
Total Protein: 7.4 g/dL (ref 6.5–8.1)

## 2024-08-28 LAB — TYPE AND SCREEN
ABO/RH(D): O POS
Antibody Screen: POSITIVE

## 2024-08-28 MED ORDER — TRAMADOL HCL 50 MG PO TABS: 50.0000 mg | ORAL_TABLET | Freq: Two times a day (BID) | ORAL | 0 refills | Status: AC | PRN

## 2024-08-28 MED ORDER — SODIUM CHLORIDE 0.9 % IV SOLN
INTRAVENOUS | Status: DC
  Filled 2024-08-28: qty 250

## 2024-08-28 MED ORDER — SODIUM CHLORIDE 0.9 % IV SOLN
20.0000 mg | Freq: Once | INTRAVENOUS | Status: AC
  Administered 2024-08-28: 20 mg via INTRAVENOUS
  Filled 2024-08-28: qty 20

## 2024-08-28 MED ORDER — SODIUM CHLORIDE 0.9 % IV SOLN
10.0000 mg/kg | Freq: Once | INTRAVENOUS | Status: AC
  Administered 2024-08-28: 800 mg via INTRAVENOUS
  Filled 2024-08-28: qty 25

## 2024-08-28 MED ORDER — ACETAMINOPHEN 325 MG PO TABS
650.0000 mg | ORAL_TABLET | Freq: Once | ORAL | Status: AC
  Administered 2024-08-28: 650 mg via ORAL
  Filled 2024-08-28: qty 2

## 2024-08-28 MED ORDER — LEVOTHYROXINE SODIUM 150 MCG PO TABS: 150.0000 ug | ORAL_TABLET | Freq: Every day | ORAL | 1 refills | Status: AC

## 2024-08-28 MED ORDER — DIPHENHYDRAMINE HCL 50 MG/ML IJ SOLN
50.0000 mg | Freq: Once | INTRAMUSCULAR | Status: AC
  Administered 2024-08-28: 50 mg via INTRAVENOUS
  Filled 2024-08-28: qty 1

## 2024-08-28 MED ORDER — MONTELUKAST SODIUM 10 MG PO TABS
10.0000 mg | ORAL_TABLET | Freq: Once | ORAL | Status: AC
  Administered 2024-08-28: 10 mg via ORAL
  Filled 2024-08-28: qty 1

## 2024-08-28 MED ORDER — FAMOTIDINE IN NACL 20-0.9 MG/50ML-% IV SOLN
20.0000 mg | Freq: Once | INTRAVENOUS | Status: AC
  Administered 2024-08-28: 20 mg via INTRAVENOUS
  Filled 2024-08-28: qty 50

## 2024-08-28 MED FILL — Dexamethasone Sodium Phosphate Inj 100 MG/10ML: INTRAMUSCULAR | Qty: 4 | Status: AC

## 2024-08-28 NOTE — Patient Instructions (Signed)
 [facility] - A DEPT OF [facility]  Discharge Instructions: Thank you for choosing [facility] to provide your oncology and hematology care.  If you have a lab appointment with the Cancer Center, please go directly to the Cancer Center and check in at the registration area.  Wear comfortable clothing and clothing appropriate for easy access to any Portacath or PICC line.   We strive to give you quality time with your provider. You may need to reschedule your appointment if you arrive late (15 or more minutes).  Arriving late affects you and other patients whose appointments are after yours.  Also, if you miss three or more appointments without notifying the office, you may be dismissed from the clinic at the providers discretion.      For prescription refill requests, have your pharmacy contact our office and allow 72 hours for refills to be completed.    Today you received the following chemotherapy and/or immunotherapy agents: isatuximab -irfc (SARCLISA )       To help prevent nausea and vomiting after your treatment, we encourage you to take your nausea medication as directed.  BELOW ARE SYMPTOMS THAT SHOULD BE REPORTED IMMEDIATELY: *FEVER GREATER THAN 100.4 F (38 C) OR HIGHER *CHILLS OR SWEATING *NAUSEA AND VOMITING THAT IS NOT CONTROLLED WITH YOUR NAUSEA MEDICATION *UNUSUAL SHORTNESS OF BREATH *UNUSUAL BRUISING OR BLEEDING *URINARY PROBLEMS (pain or burning when urinating, or frequent urination) *BOWEL PROBLEMS (unusual diarrhea, constipation, pain near the anus) TENDERNESS IN MOUTH AND THROAT WITH OR WITHOUT PRESENCE OF ULCERS (sore throat, sores in mouth, or a toothache) UNUSUAL RASH, SWELLING OR PAIN  UNUSUAL VAGINAL DISCHARGE OR ITCHING   Items with * indicate a potential emergency and should be followed up as soon as possible or go to the Emergency Department if any problems should occur.  Please show the CHEMOTHERAPY ALERT CARD  or IMMUNOTHERAPY ALERT CARD at check-in to the Emergency Department and triage nurse.  Should you have questions after your visit or need to cancel or reschedule your appointment, please contact [facility] - A DEPT OF [facility]  [phone] and follow the prompts.  Office hours are 8:00 a.m. to 4:30 p.m. Monday - Friday. Please note that voicemails left after 4:00 p.m. may not be returned until the following business day.  We are closed weekends and major holidays. You have access to a nurse at all times for urgent questions. Please call the main number to the clinic [phone] and follow the prompts.  For any non-urgent questions, you may also contact your provider using MyChart. We now offer e-Visits for anyone 39 and older to request care online for non-urgent symptoms. For details visit [email or web address].   Also download the MyChart app! Go to the app store, search MyChart, open the app, select [facility], and log in with your MyChart username and password.

## 2024-08-28 NOTE — Assessment & Plan Note (Addendum)
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   PET SEP 2025-  No findings of active myeloma.  low-grade activity in a 1.5 cm partially sclerotic lesion in the right distal femoral metaphysis, probably an enchondroma or similar incidental benign lesion. The appearance is not characteristic for myeloma. # JAN 6th, 2026- bone marrow biopsy: Mildly hypercellular bone marrow (40% with involvement by the  patient's known plasma cell myeloma at approximately 40% of the overall cellular marrow. PROGRESSION NOTED.   # Proceed with ISA-Dex 20-Venatoclax- ramp up to 400 mg  [per recommendations from [facility]]-while awaiting CAR-T therapy.  # Infection prophylaxis: Acyclovir ; asprin- add IVIG infusion [IgG < 400]- plan  IVIG- q 4 w #1 [sep 2025]; Hx  MAY 2025- thigh infection s/p wound VAC [Dr.Rodenberg]  prn  IVIG prn/monthly- NOV IgG- 480- recurrent abdominal abccess- Continue IVIG infusion-   # DROP in EF:  MARCH 2024-Echo [[facility]] Left ventricular EF 50 to 55%;  ECHO  04/23/2024 50-55%-06/2024- 50-555 EF- stable.   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    # Anemia- JAn 2025- I ron sat- 8%; ferritin- 19 on prn venofer - stable.    # Electrolyte: Hypokalemia: mild-  continue compliance Kdur TID-; hypocalcemia- 8.0- ? Causing cramps- recommend ca OTC BID   # Iatrogenic hypothyroidism [ Graves/goiter s/p total thyroidectomy;aug,2022 ]-currently on Synthroid  150 mcg/day-continue current dose stable TSH.  # Glaucoma BIL- L > R [Dr.King; [facility]] -  Discussed with Dr.Haines, ophthalmology- Glaucoma under control- s/p surgery for retinal detachment-on 06/06/2024. SABRA  Continue weekly 20 mg Dex-Stable  # chronic  Lower back discomfort - Continue tramadol - refilled Stable.   # Bone lesions /last zometa  on 11/27/2017.  S/p   Dental extraction- on dentures-  PET scan-  NO evidence of any bone lesions. ? Zometa   given risk of ONJ.Stable. .   # IV access: mediport- functional  # ACP:  interested in continue current scope of therapy; and full code.  6 w- given dental extrac- on 1/26. :MM panel; K/l light chain ratio q 4 W  ; NO zometa - dental issues-ok; but hypocalemia  1/04; IVIG q M- - 12/18- PS:  # DISPOSITION: # order standing orders for 4 weeks- MM panel/k-l light chains- # chemo today  # IVIG on 2/12 # follow up as per IS-  -Dr.B

## 2024-08-28 NOTE — Patient Instructions (Signed)
 Device With a Small Disc Placed for Long-Term IV Use (Implanted Port): Care at Home An implanted port is a device with a small disc that's put under your skin. In most cases, it's placed in your chest. It can be used to draw blood and send medicines and fluids quickly into your bloodstream. You may need a port to give you: IV medicines that would bother the small veins in your hands or arms. IV medicines for a long time, such as medicines to kill cancer cells (chemotherapy). IV liquid nutrition for a long time. An implanted port has 2 main parts: The portal. This is the round disc where the needle is put in. It may look like a small, raised area under your skin. The catheter. This is a soft tube that connects the port to a vein. You may be able to do more everyday tasks with a port than with other types of long-term IVs. How is my port accessed?  A numbing cream may be put on the skin over the port site. Your skin will be cleaned with a solution that kills germs. Your health care provider will gently pinch the port and put a needle in it. The port will be checked to make sure it's in the vein and working. If you need to get medicine all the time, the needle may be left in the port. Your provider will put a clear bandage over the needle site. The bandage and needle will need to be changed each week. What is flushing? Flushing helps keep the port working like it should. Flush your port as told. If your port is only used from time to time to give medicines or draw blood, you may need to flush it: Before and after medicines have been given. Before and after blood has been drawn. Every 4-6 weeks to keep it working well. If you get medicine through your port all the time, you may not need to flush it. Talk with your provider to learn more. How long will my port stay in? The port will stay in for as long as it's needed. When it's time for it to come out, you'll have a procedure to remove it. Follow  these instructions at home: Caring for your port and port site Flush your port as told. If you need to get an infusion of medicine over a few days, take care of your port as told. Make sure you: Take care of your port site. Wash your hands with soap and water  for at least 20 seconds before and after you change your bandage. If you can't use soap and water , use hand sanitizer. Put any used bandages or infusion bags in a plastic bag. Throw the bag in the trash. Keep the bandage that covers the needle clean and dry. Do not let it get wet. Do not use scissors or sharp objects near the tubes. Keep any tubes clamped, unless they're being used. Check the area around your port site every day for signs of infection. Check for: Redness, swelling, or pain. Fluid or blood. Warmth. Pus or a bad smell. Protect the skin around the port site. Avoid wearing bra straps that rub or irritate the site. Protect the skin around your port from seat belts. Place a soft pad over your chest if needed. Do not take baths, swim, or use a hot tub until you're told it's OK. Ask if you can shower. General instructions  Throw away any syringes in a container that's meant for sharp  items (sharps container). You can buy a sharps container from a pharmacy. You can also make one by using an empty, hard plastic bottle with a lid. Always carry a medical alert card or wear a medical alert bracelet. Ask what things are safe for you to do at home. Ask when you can go back to work or school. Where to find more information To learn more, go to: American Cancer Society at prombar.it. Click on the magnifying glass and type IV lines and ports. Find the link you need. American Society of Clinical Oncology at fabvets.de. Click on the magnifying glass and type getting chemo infusions. Find the link you need. Contact a health care provider if: You can't flush your port. You can't draw blood from the port. You have a fever or  chills. You have any signs of infection. You have swelling in your arm, neck, or shoulder. This information is not intended to replace advice given to you by your health care provider. Make sure you discuss any questions you have with your health care provider. Document Revised: 04/24/2024 Document Reviewed: 04/24/2024 Elsevier Patient Education  2025 [employer].

## 2024-08-28 NOTE — Progress Notes (Signed)
 Pt was seen at the [facility] ED 08/17/24 for pneumonia.  Needs refill on tramadol  and levothyroxin, pended.

## 2024-08-29 ENCOUNTER — Inpatient Hospital Stay

## 2024-08-29 LAB — KAPPA/LAMBDA LIGHT CHAINS
Kappa free light chain: 1.9 mg/L — ABNORMAL LOW (ref 3.3–19.4)
Kappa, lambda light chain ratio: 0.01 — ABNORMAL LOW (ref 0.26–1.65)
Lambda free light chains: 224 mg/L — ABNORMAL HIGH (ref 5.7–26.3)

## 2024-08-30 LAB — MULTIPLE MYELOMA PANEL, SERUM
Albumin SerPl Elph-Mcnc: 3.6 g/dL (ref 2.9–4.4)
Albumin/Glob SerPl: 1.1 (ref 0.7–1.7)
Alpha 1: 0.2 g/dL (ref 0.0–0.4)
Alpha2 Glob SerPl Elph-Mcnc: 0.7 g/dL (ref 0.4–1.0)
B-Globulin SerPl Elph-Mcnc: 1.5 g/dL — ABNORMAL HIGH (ref 0.7–1.3)
Gamma Glob SerPl Elph-Mcnc: 1.1 g/dL (ref 0.4–1.8)
Globulin, Total: 3.5 g/dL (ref 2.2–3.9)
IgA: 624 mg/dL — ABNORMAL HIGH (ref 64–422)
IgG (Immunoglobin G), Serum: 458 mg/dL — ABNORMAL LOW (ref 586–1602)
IgM (Immunoglobulin M), Srm: <5 mg/dL — ABNORMAL LOW (ref 26–217)
M Protein SerPl Elph-Mcnc: 0.7 g/dL — ABNORMAL HIGH
Total Protein ELP: 7.1 g/dL (ref 6.0–8.5)

## 2024-09-04 ENCOUNTER — Inpatient Hospital Stay

## 2024-09-04 ENCOUNTER — Inpatient Hospital Stay: Admitting: Internal Medicine

## 2024-09-05 ENCOUNTER — Inpatient Hospital Stay

## 2024-09-06 ENCOUNTER — Inpatient Hospital Stay

## 2024-09-11 ENCOUNTER — Inpatient Hospital Stay

## 2024-09-11 ENCOUNTER — Inpatient Hospital Stay: Admitting: Internal Medicine

## 2024-09-18 ENCOUNTER — Inpatient Hospital Stay

## 2024-09-18 ENCOUNTER — Inpatient Hospital Stay: Admitting: Internal Medicine

## 2024-09-19 ENCOUNTER — Inpatient Hospital Stay

## 2024-09-25 ENCOUNTER — Inpatient Hospital Stay

## 2024-09-26 ENCOUNTER — Inpatient Hospital Stay

## 2024-10-02 ENCOUNTER — Inpatient Hospital Stay

## 2024-10-02 ENCOUNTER — Inpatient Hospital Stay: Admitting: Internal Medicine

## 2024-10-03 ENCOUNTER — Inpatient Hospital Stay

## 2025-06-25 ENCOUNTER — Ambulatory Visit
